# Patient Record
Sex: Male | Born: 1955 | Race: White | Hispanic: No | Marital: Married | Smoking: Never smoker
Health system: Southern US, Community
[De-identification: ages and names within clinical notes are randomized; demographics above are authoritative.]

## PROBLEM LIST (undated history)

## (undated) DIAGNOSIS — Z86718 Personal history of other venous thrombosis and embolism: Secondary | ICD-10-CM

## (undated) DIAGNOSIS — C801 Malignant (primary) neoplasm, unspecified: Secondary | ICD-10-CM

## (undated) DIAGNOSIS — E785 Hyperlipidemia, unspecified: Secondary | ICD-10-CM

## (undated) HISTORY — DX: Hyperlipidemia, unspecified: E78.5

## (undated) HISTORY — PX: CLEFT PALATE REPAIR: SUR1165

## (undated) HISTORY — DX: Personal history of other venous thrombosis and embolism: Z86.718

## (undated) HISTORY — PX: OTHER SURGICAL HISTORY: SHX169

## (undated) HISTORY — PX: TONSILLECTOMY: SUR1361

---

## 2006-03-19 ENCOUNTER — Emergency Department (HOSPITAL_COMMUNITY): Admission: EM | Admit: 2006-03-19 | Discharge: 2006-03-20 | Payer: Self-pay | Admitting: Emergency Medicine

## 2006-12-30 ENCOUNTER — Ambulatory Visit: Payer: Self-pay | Admitting: Internal Medicine

## 2007-02-23 ENCOUNTER — Ambulatory Visit: Payer: Self-pay | Admitting: Internal Medicine

## 2007-02-23 LAB — CONVERTED CEMR LAB
ALT: 48 units/L — ABNORMAL HIGH (ref 0–40)
AST: 38 units/L — ABNORMAL HIGH (ref 0–37)
Albumin: 4 g/dL (ref 3.5–5.2)
Alkaline Phosphatase: 37 units/L — ABNORMAL LOW (ref 39–117)
BUN: 19 mg/dL (ref 6–23)
Basophils Absolute: 0 10*3/uL (ref 0.0–0.1)
Basophils Relative: 0.9 % (ref 0.0–1.0)
Bilirubin, Direct: 0.1 mg/dL (ref 0.0–0.3)
CO2: 29 meq/L (ref 19–32)
Calcium: 9.4 mg/dL (ref 8.4–10.5)
Chloride: 105 meq/L (ref 96–112)
Cholesterol: 242 mg/dL (ref 0–200)
Creatinine, Ser: 1.1 mg/dL (ref 0.4–1.5)
Direct LDL: 170.6 mg/dL
Eosinophils Absolute: 0.2 10*3/uL (ref 0.0–0.6)
Eosinophils Relative: 2.9 % (ref 0.0–5.0)
GFR calc Af Amer: 91 mL/min
GFR calc non Af Amer: 75 mL/min
Glucose, Bld: 115 mg/dL — ABNORMAL HIGH (ref 70–99)
HCT: 43.6 % (ref 39.0–52.0)
HDL: 36.9 mg/dL — ABNORMAL LOW (ref >39.0)
Hemoglobin: 15.3 g/dL (ref 13.0–17.0)
Hgb A1c MFr Bld: 6.5 % — ABNORMAL HIGH (ref 4.6–6.0)
LDL Cholesterol: 160 mg/dL — ABNORMAL HIGH (ref 0–99)
Lymphocytes Relative: 40.3 % (ref 12.0–46.0)
MCHC: 35.2 g/dL (ref 30.0–36.0)
MCV: 92.7 fL (ref 78.0–100.0)
Monocytes Absolute: 0.5 10*3/uL (ref 0.2–0.7)
Monocytes Relative: 9.4 % (ref 3.0–11.0)
Neutro Abs: 2.5 10*3/uL (ref 1.4–7.7)
Neutrophils Relative %: 46.5 % (ref 43.0–77.0)
PSA: 0.68 ng/mL (ref 0.10–4.00)
Platelets: 238 10*3/uL (ref 150–400)
Potassium: 4.4 meq/L (ref 3.5–5.1)
RBC: 4.7 M/uL (ref 4.22–5.81)
RDW: 13.3 % (ref 11.5–14.6)
Sodium: 139 meq/L (ref 135–145)
TSH: 0.76 microintl units/mL (ref 0.35–5.50)
Total Bilirubin: 0.9 mg/dL (ref 0.3–1.2)
Total CHOL/HDL Ratio: 6.6
Total Protein: 6.8 g/dL (ref 6.0–8.3)
Triglycerides: 226 mg/dL (ref 0–149)
VLDL: 45 mg/dL — ABNORMAL HIGH (ref 0–40)
WBC: 5.4 10*3/uL (ref 4.5–10.5)

## 2007-03-02 ENCOUNTER — Ambulatory Visit: Payer: Self-pay | Admitting: Internal Medicine

## 2007-05-12 DIAGNOSIS — J309 Allergic rhinitis, unspecified: Secondary | ICD-10-CM | POA: Insufficient documentation

## 2007-05-12 DIAGNOSIS — E785 Hyperlipidemia, unspecified: Secondary | ICD-10-CM

## 2007-06-02 ENCOUNTER — Ambulatory Visit: Payer: Self-pay | Admitting: Internal Medicine

## 2007-06-02 LAB — CONVERTED CEMR LAB
ALT: 65 units/L — ABNORMAL HIGH (ref 0–53)
AST: 44 units/L — ABNORMAL HIGH (ref 0–37)
Albumin: 4.1 g/dL (ref 3.5–5.2)
Alkaline Phosphatase: 39 units/L (ref 39–117)
Bilirubin, Direct: 0.1 mg/dL (ref 0.0–0.3)
Cholesterol: 181 mg/dL (ref 0–200)
HDL: 28.3 mg/dL — ABNORMAL LOW (ref >39.0)
LDL Cholesterol: 127 mg/dL — ABNORMAL HIGH (ref 0–99)
Total Bilirubin: 0.7 mg/dL (ref 0.3–1.2)
Total CHOL/HDL Ratio: 6.4
Total Protein: 6.8 g/dL (ref 6.0–8.3)
Triglycerides: 128 mg/dL (ref 0–149)
VLDL: 26 mg/dL (ref 0–40)

## 2007-06-05 ENCOUNTER — Telehealth: Payer: Self-pay | Admitting: Internal Medicine

## 2007-06-09 ENCOUNTER — Ambulatory Visit: Payer: Self-pay | Admitting: Internal Medicine

## 2007-06-09 DIAGNOSIS — IMO0002 Reserved for concepts with insufficient information to code with codable children: Secondary | ICD-10-CM

## 2007-06-09 DIAGNOSIS — B372 Candidiasis of skin and nail: Secondary | ICD-10-CM

## 2007-06-09 LAB — CONVERTED CEMR LAB
Cholesterol, target level: 200 mg/dL
HDL goal, serum: 40 mg/dL
LDL Goal: 130 mg/dL

## 2007-07-14 ENCOUNTER — Telehealth: Payer: Self-pay | Admitting: *Deleted

## 2007-07-18 ENCOUNTER — Telehealth: Payer: Self-pay | Admitting: Internal Medicine

## 2007-09-22 ENCOUNTER — Ambulatory Visit: Payer: Self-pay | Admitting: Internal Medicine

## 2007-09-22 LAB — CONVERTED CEMR LAB
Cholesterol: 210 mg/dL (ref 0–200)
Direct LDL: 155.3 mg/dL
HDL: 28.8 mg/dL — ABNORMAL LOW (ref >39.0)
Total CHOL/HDL Ratio: 7.3
Triglycerides: 160 mg/dL — ABNORMAL HIGH (ref 0–149)
VLDL: 32 mg/dL (ref 0–40)

## 2007-10-02 ENCOUNTER — Ambulatory Visit: Payer: Self-pay | Admitting: Internal Medicine

## 2007-10-02 LAB — CONVERTED CEMR LAB: LDL Cholesterol: 155 mg/dL

## 2008-01-23 ENCOUNTER — Ambulatory Visit: Payer: Self-pay | Admitting: Internal Medicine

## 2008-01-23 DIAGNOSIS — T887XXA Unspecified adverse effect of drug or medicament, initial encounter: Secondary | ICD-10-CM | POA: Insufficient documentation

## 2008-01-23 LAB — CONVERTED CEMR LAB
ALT: 69 units/L — ABNORMAL HIGH (ref 0–53)
AST: 50 units/L — ABNORMAL HIGH (ref 0–37)
Albumin: 4.3 g/dL (ref 3.5–5.2)
Alkaline Phosphatase: 30 units/L — ABNORMAL LOW (ref 39–117)
Bilirubin, Direct: 0.1 mg/dL (ref 0.0–0.3)
Cholesterol: 198 mg/dL (ref 0–200)
HDL: 29.2 mg/dL — ABNORMAL LOW (ref >39.0)
LDL Cholesterol: 145 mg/dL — ABNORMAL HIGH (ref 0–99)
Total Bilirubin: 0.9 mg/dL (ref 0.3–1.2)
Total CHOL/HDL Ratio: 6.8
Total Protein: 6.9 g/dL (ref 6.0–8.3)
Triglycerides: 117 mg/dL (ref 0–149)
VLDL: 23 mg/dL (ref 0–40)

## 2008-01-30 ENCOUNTER — Ambulatory Visit: Payer: Self-pay | Admitting: Internal Medicine

## 2008-04-22 ENCOUNTER — Ambulatory Visit: Payer: Self-pay | Admitting: Internal Medicine

## 2008-04-22 LAB — CONVERTED CEMR LAB
ALT: 55 units/L — ABNORMAL HIGH (ref 0–53)
AST: 35 units/L (ref 0–37)
Albumin: 4.1 g/dL (ref 3.5–5.2)
Alkaline Phosphatase: 32 units/L — ABNORMAL LOW (ref 39–117)
Bilirubin, Direct: 0.1 mg/dL (ref 0.0–0.3)
Cholesterol: 184 mg/dL (ref 0–200)
HDL: 28.7 mg/dL — ABNORMAL LOW (ref >39.0)
LDL Cholesterol: 127 mg/dL — ABNORMAL HIGH (ref 0–99)
Total Bilirubin: 0.8 mg/dL (ref 0.3–1.2)
Total CHOL/HDL Ratio: 6.4
Total Protein: 6.7 g/dL (ref 6.0–8.3)
Triglycerides: 143 mg/dL (ref 0–149)
VLDL: 29 mg/dL (ref 0–40)

## 2008-04-29 ENCOUNTER — Ambulatory Visit: Payer: Self-pay | Admitting: Internal Medicine

## 2008-07-29 ENCOUNTER — Ambulatory Visit: Payer: Self-pay | Admitting: Internal Medicine

## 2009-01-20 ENCOUNTER — Ambulatory Visit: Payer: Self-pay | Admitting: Internal Medicine

## 2009-01-20 LAB — CONVERTED CEMR LAB
ALT: 57 U/L — ABNORMAL HIGH
AST: 29 U/L
Albumin: 4.2 g/dL
Alkaline Phosphatase: 92 U/L
BUN: 16 mg/dL
Basophils Absolute: 0 10*3/uL
Basophils Relative: 1 %
Bilirubin Urine: NEGATIVE
Bilirubin, Direct: 0 mg/dL
Blood in Urine, dipstick: NEGATIVE
CO2: 28 meq/L
Calcium: 9.4 mg/dL
Chloride: 103 meq/L
Cholesterol: 239 mg/dL — ABNORMAL HIGH
Creatinine, Ser: 1.2 mg/dL
Direct LDL: 109 mg/dL
Eosinophils Absolute: 0.1 10*3/uL
Eosinophils Relative: 3.1 %
GFR calc non Af Amer: 67.44 mL/min
Glucose, Bld: 374 mg/dL — ABNORMAL HIGH
HCT: 45.6 %
HDL: 32.9 mg/dL — ABNORMAL LOW
Hemoglobin: 16 g/dL
Lymphocytes Relative: 41.7 %
Lymphs Abs: 1.9 10*3/uL
MCHC: 35.2 g/dL
MCV: 93 fL
Monocytes Absolute: 0.4 10*3/uL
Monocytes Relative: 8.4 %
Neutro Abs: 2.1 10*3/uL
Neutrophils Relative %: 45.8 %
Nitrite: NEGATIVE
PSA: 0.46 ng/mL
Platelets: 202 10*3/uL
Potassium: 3.8 meq/L
RBC: 4.91 M/uL
RDW: 12.2 %
Sodium: 141 meq/L
Specific Gravity, Urine: 1.01
TSH: 0.95 u[IU]/mL
Total Bilirubin: 0.9 mg/dL
Total CHOL/HDL Ratio: 7
Total Protein: 7.1 g/dL
Triglycerides: 635 mg/dL — ABNORMAL HIGH
Urobilinogen, UA: 0.2
VLDL: 127 mg/dL — ABNORMAL HIGH
WBC Urine, dipstick: NEGATIVE
WBC: 4.5 10*3/uL
pH: 5.5

## 2009-01-27 ENCOUNTER — Encounter: Payer: Self-pay | Admitting: Internal Medicine

## 2009-01-27 ENCOUNTER — Ambulatory Visit: Payer: Self-pay | Admitting: Internal Medicine

## 2009-01-27 DIAGNOSIS — E8881 Metabolic syndrome: Secondary | ICD-10-CM

## 2009-01-31 ENCOUNTER — Encounter: Payer: Self-pay | Admitting: Internal Medicine

## 2009-02-11 ENCOUNTER — Telehealth: Payer: Self-pay | Admitting: Internal Medicine

## 2009-02-19 ENCOUNTER — Telehealth (INDEPENDENT_AMBULATORY_CARE_PROVIDER_SITE_OTHER): Payer: Self-pay | Admitting: *Deleted

## 2009-03-04 ENCOUNTER — Ambulatory Visit: Payer: Self-pay | Admitting: Internal Medicine

## 2009-03-04 LAB — CONVERTED CEMR LAB
Hgb A1c MFr Bld: 8.7 % — ABNORMAL HIGH (ref 4.6–6.5)
Microalb Creat Ratio: 39.5 mg/g — ABNORMAL HIGH (ref 0.0–30.0)
Microalb, Ur: 5.6 mg/dL — ABNORMAL HIGH (ref 0.0–1.9)

## 2009-03-11 ENCOUNTER — Ambulatory Visit: Payer: Self-pay | Admitting: Internal Medicine

## 2009-06-10 ENCOUNTER — Ambulatory Visit: Payer: Self-pay | Admitting: Internal Medicine

## 2009-06-10 LAB — CONVERTED CEMR LAB
ALT: 62 units/L — ABNORMAL HIGH (ref 0–53)
AST: 43 units/L — ABNORMAL HIGH (ref 0–37)
BUN: 17 mg/dL (ref 6–23)
Calcium: 9.6 mg/dL (ref 8.4–10.5)
GFR calc non Af Amer: 61.4 mL/min (ref >60)
Glucose, Bld: 101 mg/dL — ABNORMAL HIGH (ref 70–99)
HDL: 32.6 mg/dL — ABNORMAL LOW (ref >39.00)
Hgb A1c MFr Bld: 6 % (ref 4.6–6.5)
Sodium: 142 meq/L (ref 135–145)
Total Bilirubin: 0.6 mg/dL (ref 0.3–1.2)
Total Protein: 7.3 g/dL (ref 6.0–8.3)

## 2009-07-28 ENCOUNTER — Telehealth: Payer: Self-pay | Admitting: Internal Medicine

## 2009-10-13 ENCOUNTER — Ambulatory Visit: Payer: Self-pay | Admitting: Internal Medicine

## 2009-12-22 ENCOUNTER — Ambulatory Visit: Payer: Self-pay | Admitting: Internal Medicine

## 2009-12-22 LAB — CONVERTED CEMR LAB
ALT: 54 units/L — ABNORMAL HIGH (ref 0–53)
AST: 36 units/L (ref 0–37)
Alkaline Phosphatase: 34 units/L — ABNORMAL LOW (ref 39–117)
Total Bilirubin: 0.4 mg/dL (ref 0.3–1.2)
Total CHOL/HDL Ratio: 5
Triglycerides: 100 mg/dL (ref 0.0–149.0)

## 2009-12-29 ENCOUNTER — Ambulatory Visit: Payer: Self-pay | Admitting: Internal Medicine

## 2009-12-30 ENCOUNTER — Telehealth: Payer: Self-pay | Admitting: Internal Medicine

## 2009-12-30 ENCOUNTER — Encounter: Payer: Self-pay | Admitting: Internal Medicine

## 2010-03-30 ENCOUNTER — Ambulatory Visit: Payer: Self-pay | Admitting: Internal Medicine

## 2010-03-30 DIAGNOSIS — K589 Irritable bowel syndrome without diarrhea: Secondary | ICD-10-CM

## 2010-05-11 ENCOUNTER — Ambulatory Visit: Payer: Self-pay | Admitting: Internal Medicine

## 2010-07-15 ENCOUNTER — Ambulatory Visit: Payer: Self-pay | Admitting: Internal Medicine

## 2010-07-15 LAB — CONVERTED CEMR LAB
BUN: 16 mg/dL (ref 6–23)
CO2: 29 meq/L (ref 19–32)
Chloride: 104 meq/L (ref 96–112)
Creatinine, Ser: 1.1 mg/dL (ref 0.4–1.5)

## 2010-08-10 ENCOUNTER — Telehealth: Payer: Self-pay | Admitting: Internal Medicine

## 2010-10-13 NOTE — Assessment & Plan Note (Signed)
Summary: 2 month rov/njr   Vital Signs:  Patient profile:   55 year old male Height:      69 inches Weight:      268 pounds BMI:     39.72 Temp:     98.2 degrees F oral Pulse rate:   72 / minute Resp:     14 per minute BP sitting:   136 / 80  (left arm)  Vitals Entered By: Allyne Gee, LPN (July 15, 9561 11:34 AM)  Contraindications/Deferment of Procedures/Staging:    Test/Procedure: FLU VAX    Reason for deferment: patient declined   CC: roa- states fasting blood sugars are running around 118, Back Pain, Lipid Management Is Patient Diabetic? Yes Did you bring your meter with you today? No   Primary Care Provider:  Ricard Dillon MD  CC:  roa- states fasting blood sugars are running around 118, Back Pain, and Lipid Management.  History of Present Illness: weight down some blood pressure stable weight check and discussion of diet off the pheterimine blood sugars in the 110-120 range has not had any hypoglucemic episodes  Lipid Management History:      Positive NCEP/ATP III risk factors include male age 5 years old or older and HDL cholesterol less than 40.  Negative NCEP/ATP III risk factors include non-diabetic, non-tobacco-user status, non-hypertensive, no ASHD (atherosclerotic heart disease), no prior stroke/TIA, no peripheral vascular disease, and no history of aortic aneurysm.     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 Once Daily  Off and On 2)  Flonase 50 Mcg/act  Susp (Fluticasone Propionate) .... As Needed 3)  Zyrtec Allergy 10 Mg  Tabs (Cetirizine Hcl) .... Two Times A Day As Needed 4)  Bl Glucosamine-Chondroitin 500-400 Mg  Tabs (Glucosamine-Chondroitin) .... Once Daily 5)  Fish Oil Concentrate 1000 Mg  Caps (Omega-3 Fatty Acids) .... One By Mouth Three Times A Day 6)  Trilipix 135 Mg  Cpdr (Choline Fenofibrate) .... One By Mouth Daily 7)  Janumet 50-500 Mg Tabs (Sitagliptin-Metformin Hcl) .... One By Mouth Two Times A Day 8)  Freestyle Test  Strp (Glucose Blood) .[name].. 1 Once Daily 9)  Freestyle Lancets  Misc (Lancets) .... Use As Directed  Current Medications (verified): 1)  Multivitamins   Caps (Multiple Vitamin) .... Once Daily  Off and On 2)  Flonase 50 Mcg/act  Susp (Fluticasone Propionate) .... As Needed 3)  Zyrtec Allergy 10 Mg  Tabs (Cetirizine Hcl) .... Two Times A Day As Needed 4)  Bl Glucosamine-Chondroitin 500-400 Mg  Tabs (Glucosamine-Chondroitin) .... Once Daily 5)  Fish Oil Concentrate 1000 Mg  Caps (Omega-3 Fatty Acids) .... One By Mouth Three Times A Day 6)  Trilipix 135 Mg  Cpdr (Choline Fenofibrate) .... One By Mouth Daily 7)  Janumet 50-500 Mg Tabs (Sitagliptin-Metformin Hcl) .... One By Mouth Two Times A Day 8)  Freestyle Test  Strp (Glucose Blood) .[name].. 1 Once Daily 9)  Freestyle Lancets  Misc (Lancets) .... Use As Directed  Allergies (verified): No Known Drug Allergies  Past History:  Family History: Last  updated: 06/09/2007 Father alive at b3 Family History Hypertension Mother  Social History: Last updated: 06/09/2007 Married Never Smoked Alcohol use-yes Drug use-no Regular exercise-no  Risk Factors: Exercise: no (06/09/2007)  Risk Factors: Smoking Status: never (07/15/2010)  Past medical, surgical, family and social histories (including risk factors) reviewed, and no changes noted (except as noted below).  Past Medical History: Reviewed history from 05/12/2007 and no changes required. Allergic rhinitis Hyperlipidemia  Past Surgical History: Reviewed history from 06/09/2007 and no changes required. 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 Father alive at b3 Family History Hypertension Mother  Social History: Reviewed history from 06/09/2007 and no changes required. Married Never Smoked Alcohol use-yes Drug use-no Regular exercise-no  Review of Systems  The patient denies anorexia, fever, weight loss, weight gain, vision loss, decreased hearing, hoarseness, chest pain, syncope, dyspnea on exertion, peripheral edema, prolonged cough, headaches, hemoptysis, abdominal pain, melena, hematochezia, severe indigestion/heartburn, hematuria, incontinence, genital sores, muscle weakness, suspicious skin lesions, transient blindness, difficulty walking, depression, unusual weight change, abnormal bleeding, enlarged lymph nodes, angioedema, and breast masses.    Physical Exam  General:  Well-developed,well-nourished,in no acute distress; alert,appropriate and cooperative throughout examination Head:  Normocephalic and atraumatic without obvious abnormalities. No apparent alopecia or balding. Eyes:  pupils equal and pupils round.   Nose:  no external deformity and no nasal discharge.   Mouth:  pharynx pink and moist.   Neck:  No deformities, masses, or tenderness noted. Lungs:  Normal respiratory effort,  chest expands symmetrically. Lungs are clear to auscultation, no crackles or wheezes. Heart:  Normal rate and regular rhythm. S1 and S2 normal without gallop, murmur, click, rub or other extra sounds.   Impression & Recommendations:  Problem # 1:  DYSMETABOLIC SYNDROME X (ZOX-096.0) Assessment Deteriorated  weight loss and jamumet with goal of getting off meds via weight loss weight watchers optifast?  Orders: Venipuncture (45409) Specimen Handling (99000) TLB-BMP (Basic Metabolic Panel-BMET) (81191-YNWGNFA) TLB-A1C / Hgb A1C (Glycohemoglobin) (83036-A1C)  Problem # 2:  IRRITABLE BOWEL SYNDROME (ICD-564.1) fiber diet and smaller more frequent meals  Problem # 3:  HYPERLIPIDEMIA (ICD-272.4)  His updated medication list for this problem includes:    Trilipix 135 Mg Cpdr (Choline fenofibrate) ..... One by mouth daily  Labs Reviewed: SGOT: 36 (12/22/2009)   SGPT: 54 (12/22/2009)  Lipid Goals: Chol Goal: 200 (06/09/2007)   HDL Goal: 40 (06/09/2007)   LDL Goal: 130 (06/09/2007)   TG Goal: 150 (06/09/2007)  10 Yr Risk Heart Disease: 11 % Prior 10 Yr Risk Heart Disease: 14 % (05/11/2010)   HDL:34.10 (12/22/2009), 32.60 (06/10/2009)  LDL:112 (12/22/2009), 127 (04/22/2008)  Chol:166 (12/22/2009), 182 (06/10/2009)  Trig:100.0 (12/22/2009), 635.0 (01/20/2009)  Complete Medication List: 1)  Multivitamins Caps (Multiple vitamin) .... Once daily  off and on 2)  Flonase 50 Mcg/act Susp (Fluticasone propionate) .... As needed 3)  Zyrtec Allergy 10 Mg Tabs (Cetirizine hcl) .... Two  times a day as needed 4)  Bl Glucosamine-chondroitin 500-400 Mg Tabs (Glucosamine-chondroitin) .... Once daily 5)  Fish Oil Concentrate 1000 Mg Caps (Omega-3 fatty acids) .... One by mouth three times a day 6)  Trilipix 135 Mg Cpdr (Choline fenofibrate) .... One by mouth daily 7)  Janumet 50-500 Mg Tabs (Sitagliptin-metformin hcl) .... One by mouth two times a day 8)  Freestyle Test Strp (Glucose blood) .[name].. 1  once daily 9)  Freestyle Lancets Misc (Lancets) .... Use as directed  Lipid Assessment/Plan:      Based on NCEP/ATP III, the patient's risk factor category is "0-1 risk factors".  The patient's lipid goals are as follows: Total cholesterol goal is 200; LDL cholesterol goal is 130; HDL cholesterol goal is 40; Triglyceride goal is 150.  His LDL cholesterol goal has not been met.  Secondary causes for hyperlipidemia have been ruled out.  He has been counseled on adjunctive measures for lowering his cholesterol and has been provided with dietary instructions.    Patient Instructions: 1)  optifast or weight watchers ASAP 2)  Please schedule a follow-up appointment in 3 months. Prescriptions: JANUMET 50-500 MG TABS (SITAGLIPTIN-METFORMIN HCL) one by mouth two times a day  #180 x 3   Entered and Authorized by:   Ricard Dillon MD   Signed by:   Ricard Dillon MD on 07/15/2010   Method used:   Faxed to ...       [facility] (mail-order)             , [place]         Ph: [phone]       Fax: [phone]   RxID:   [id number] [place] (CHOLINE FENOFIBRATE) one by mouth daily  #90 x 3   Entered and Authorized by:   Ricard Dillon MD   Signed by:   Ricard Dillon MD on 07/15/2010   Method used:   Faxed to ...       [facility] (mail-order)             , [place]         Ph: [phone]       Fax: [phone]   RxID:   [id number]    Orders Added: 1)  Est. Patient Level IV [83374] 2)  Venipuncture [45146] 3)  Specimen Handling [04799] 8)  TLB-BMP (Basic Metabolic Panel-BMET) [72158-NGBMBOM] 5)  TLB-A1C / Hgb A1C (Glycohemoglobin) [83036-A1C]

## 2010-10-13 NOTE — Medication Information (Signed)
Summary: Prior Authorization and Coverage Approval for Phentermine Hcl  Prior Authorization and Coverage Approval for Phentermine Hcl   Imported By: Laural Benes 01/05/2010 13:15:11  _____________________________________________________________________  External Attachment:    Type:   Image     Comment:   External Document

## 2010-10-13 NOTE — Progress Notes (Signed)
Summary: Pt req new scripts for Trilipix and Janumet to [facility] order  Phone Note Refill Request Call back at Home Phone [phone] Call back at Work Phone [phone] Message from:  Patient on August 10, 2010 11:58 AM  Refills Requested: Medication #1:  TRILIPIX 135 MG  CPDR one by mouth daily   Dosage confirmed as above?Dosage Confirmed   Supply Requested: 3 months  Medication #2:  JANUMET 50-500 MG TABS one by mouth two times a day   Dosage confirmed as above?Dosage Confirmed   Supply Requested: 3 months Pt is needing new scripts written asap and sent to [facility] mail order. The old scripts expired per [facility].      Method Requested: Fax to [facility] Order ASAP Initial call taken by: Braulio Bosch,  August 10, 2010 11:58 AM    Prescriptions: JANUMET 50-500 MG TABS (SITAGLIPTIN-METFORMIN HCL) one by mouth two times a day  #180 x 3   Entered by:   Allyne Gee, LPN   Authorized by:   Ricard Dillon MD   Signed by:   Allyne Gee, LPN on 73/22/5672   Method used:   Electronically to        [facility] (retail)             ,          Ph: [phone]       Fax: [phone]   RxID:   [id number] TRILIPIX 135 [place] (CHOLINE FENOFIBRATE) one by mouth daily  #90 x 3   Entered by:   Allyne Gee, LPN   Authorized by:   Ricard Dillon MD   Signed by:   Allyne Gee, LPN on 36/85/9923   Method used:   Electronically to        [facility] (retail)             ,          Ph: [phone]       Fax: [phone]   RxID:   [id number]

## 2010-10-13 NOTE — Assessment & Plan Note (Signed)
Summary: 3 month rov/njr   Vital Signs:  Patient profile:   55 year old male Height:      69 inches Weight:      266 pounds BMI:     39.42 Temp:     98.2 degrees F oral Pulse rate:   7 / minute Resp:     14 per minute BP sitting:   126 / 80  (left arm) Cuff size:   large  Vitals Entered By: Allyne Gee, LPN (March 30, 1760 6:07 AM) CC: roa- couldnt tolerate phentermine-took 3 and it made him feel "funny", Lipid Management   CC:  roa- couldnt tolerate phentermine-took 3 and it made him feel "funny" and Lipid Management.  History of Present Illness: took  the phenteriming and had palpitations was very anxious could not tolerate the medications pt has morbid obesity chronic back pain  hyperlipidemia   Lipid Management History:      Positive NCEP/ATP III risk factors include male age 54 years old or older and HDL cholesterol less than 40.  Negative NCEP/ATP III risk factors include non-diabetic, non-tobacco-user status, non-hypertensive, no ASHD (atherosclerotic heart disease), no prior stroke/TIA, no peripheral vascular disease, and no history of aortic aneurysm.     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 Once Daily  Off and On 2)  Flonase 50 Mcg/act  Susp (Fluticasone Propionate) .... As Needed 3)  Zyrtec Allergy 10 Mg  Tabs (Cetirizine Hcl) .... Two Times A Day As Needed 4)  Bl Glucosamine-Chondroitin 500-400 Mg  Tabs (Glucosamine-Chondroitin) .... Once Daily 5)  Fish Oil Concentrate 1000 Mg  Caps (Omega-3 Fatty Acids) .... One By Mouth Three Times A Day 6)  Trilipix 135 Mg  Cpdr (Choline Fenofibrate) .... One By Mouth Daily 7)  Janumet 50-500 Mg Tabs (Sitagliptin-Metformin Hcl) .... One By Mouth Two Times A Day 8)  Freestyle Test  Strp (Glucose Blood) .[name].. 1 Once Daily 9)  Freestyle Lancets  Misc (Lancets) .... Use As Directed 10)  Phentermine Hcl 37.5 Mg Caps (Phentermine Hcl) .... One By Mouth Q Am  Current Medications (verified): 1)  Multivitamins   Caps (Multiple Vitamin) .... Once Daily  Off and On 2)  Flonase 50 Mcg/act  Susp (Fluticasone Propionate) .... As Needed 3)  Zyrtec Allergy 10 Mg  Tabs (Cetirizine Hcl) .... Two Times A Day As Needed 4)  Bl Glucosamine-Chondroitin 500-400 Mg  Tabs (Glucosamine-Chondroitin) .... Once Daily 5)  Fish Oil Concentrate 1000 Mg  Caps (Omega-3 Fatty Acids) .... One By Mouth Three Times A Day 6)  Trilipix 135 Mg  Cpdr (Choline Fenofibrate) .... One By Mouth Daily 7)  Janumet 50-500 Mg Tabs (Sitagliptin-Metformin Hcl) .... One  By Mouth Two Times A Day 8)  Freestyle Test  Strp (Glucose Blood) .[name].. 1 Once Daily 9)  Freestyle Lancets  Misc (Lancets) .... Use As Directed  Allergies (verified): No Known Drug Allergies  Past History:  Family History: Last updated: 06/09/2007 Father alive at b3 Family History Hypertension Mother  Social History: Last updated: 06/09/2007 Married Never Smoked Alcohol use-yes Drug use-no Regular exercise-no  Risk Factors: Exercise: no (06/09/2007)  Risk Factors: Smoking Status: never (03/30/2010)  Past medical,  surgical, family and social histories (including risk factors) reviewed, and no changes noted (except as noted below).  Past Medical History: Reviewed history from 05/12/2007 and no changes required. Allergic rhinitis Hyperlipidemia  Past Surgical History: Reviewed history from 06/09/2007 and no changes required. 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 Father alive at b3 Family History Hypertension Mother  Social History: Reviewed history from 06/09/2007 and no changes required. Married Never Smoked Alcohol use-yes Drug use-no Regular exercise-no  Review of Systems  The patient denies anorexia, fever, weight loss, weight gain, vision loss, decreased hearing, hoarseness, chest pain, syncope, dyspnea on exertion, peripheral edema, prolonged cough, headaches, hemoptysis, abdominal pain, melena, hematochezia, severe indigestion/heartburn, hematuria, incontinence, genital sores, muscle weakness, suspicious skin lesions, transient blindness, difficulty walking, depression, unusual weight change, abnormal bleeding, enlarged lymph nodes, angioedema, and breast masses.    Physical Exam  General:  Well-developed,well-nourished,in no acute distress; alert,appropriate and cooperative throughout examination Head:  Normocephalic and atraumatic without obvious abnormalities. No apparent alopecia or balding. Eyes:  pupils equal and pupils round.   Ears:  R ear normal and L ear normal.   Nose:  no external deformity and no nasal discharge.   Mouth:  pharynx pink and moist.   Neck:  No deformities, masses, or tenderness noted. Lungs:  Normal respiratory effort, chest expands symmetrically. Lungs are clear to auscultation, no crackles or wheezes. Heart:  Normal rate and regular rhythm. S1 and S2 normal without gallop, murmur, click, rub or other extra sounds. Abdomen:  soft, non-tender, and distended.  obese normal bowell  sounds Rectal:  No external abnormalities noted. Normal sphincter tone. No rectal masses or tenderness. Msk:  No deformity or scoliosis noted of thoracic or lumbar spine.   Pulses:  R and L carotid,radial,femoral,dorsalis pedis and posterior tibial pulses are full and equal bilaterally Extremities:  trace left pedal edema and trace right pedal edema.   Neurologic:  alert & oriented X3 and gait normal.   Psych:  Oriented X3 and normally interactive.     Impression & Recommendations:  Problem # 1:  MORBID OBESITY (ICD-278.01) trial of alli Ht: 69 (03/30/2010)   Wt: 266 (03/30/2010)   BMI: 39.42 (03/30/2010)  Problem # 2:  DYSMETABOLIC SYNDROME X (YFV-494.7)  Problem # 3:  HYPERLIPIDEMIA (WHQ-759.4)  His updated medication list for this problem includes:    Trilipix 135 Mg Cpdr (Choline fenofibrate) ..... One by mouth daily  Labs Reviewed: SGOT: 36 (12/22/2009)   SGPT: 54 (12/22/2009)  Lipid Goals: Chol Goal: 200 (06/09/2007)   HDL Goal: 40 (06/09/2007)   LDL Goal: 130 (06/09/2007)   TG Goal: 150 (06/09/2007)  10 Yr Risk Heart Disease: 9 % Prior 10 Yr Risk Heart Disease: 14 % (12/29/2009)   HDL:34.10 (12/22/2009), 32.60 (06/10/2009)  LDL:112 (12/22/2009), 127 (04/22/2008)  Chol:166 (12/22/2009), 182 (06/10/2009)  Trig:100.0 (12/22/2009), 635.0 (01/20/2009)  Problem # 4:  IRRITABLE BOWEL SYNDROME (ICD-564.1)  Labs Reviewed: SGOT: 36 (12/22/2009)   SGPT:  54 (12/22/2009)  Lipid Goals: Chol Goal: 200 (06/09/2007)   HDL Goal: 40 (06/09/2007)   LDL Goal: 130 (06/09/2007)   TG Goal: 150 (06/09/2007)  10 Yr Risk Heart Disease: 9 % Prior 10 Yr Risk Heart Disease: 14 % (12/29/2009)   HDL:34.10 (12/22/2009), 32.60 (06/10/2009)  LDL:112 (12/22/2009), 127 (04/22/2008)  Chol:166 (12/22/2009), 182 (06/10/2009)  Trig:100.0 (12/22/2009), 635.0 (01/20/2009)  Problem # 5:  ALLERGIC RHINITIS (ICD-477.9)  His updated medication list for this problem includes:    Flonase 50 Mcg/act Susp  (Fluticasone propionate) .[name]... As needed    Zyrtec Allergy 10 Mg Tabs (Cetirizine hcl) .[name]..[name] Two times a day as needed  Discussed use of allergy medications and environmental measures.   Complete Medication List: 1)  Multivitamins Caps (Multiple vitamin) .... Once daily  off and on 2)  Flonase 50 Mcg/act Susp (Fluticasone propionate) .... As needed 3)  Zyrtec Allergy 10 Mg Tabs (Cetirizine hcl) .... Two times a day as needed 4)  Bl Glucosamine-chondroitin 500-400 Mg Tabs (Glucosamine-chondroitin) .... Once daily 5)  Fish Oil Concentrate 1000 Mg Caps (Omega-3 fatty acids) .... One by mouth three times a day 6)  Trilipix 135 Mg Cpdr (Choline fenofibrate) .... One by mouth daily 7)  Janumet 50-500 Mg Tabs (Sitagliptin-metformin hcl) .... One by mouth two times a day 8)  Freestyle Test Strp (Glucose blood) .[name].. 1 once daily 9)  Freestyle Lancets Misc (Lancets) .... Use as directed  Lipid Assessment/Plan:      Based on NCEP/ATP III, the patient's risk factor category is "2 or more risk factors and a calculated 10 year CAD risk of < 20%".  The patient's lipid goals are as follows: Total cholesterol goal is 200; LDL cholesterol goal is 130; HDL cholesterol goal is 40; Triglyceride goal is 150.  His LDL cholesterol goal has not been met.  Secondary causes for hyperlipidemia have been ruled out.  He has been counseled on adjunctive measures for lowering his cholesterol and has been provided with dietary instructions.    Patient Instructions: 1)  trial of the alli program for fat blocking 2)  Please schedule a follow-up appointment in 1 month.

## 2010-10-13 NOTE — Assessment & Plan Note (Signed)
Summary: 1 month rov/njr   Vital Signs:  Patient profile:   55 year old male Height:      69 inches Weight:      271 pounds BMI:     40.16 Temp:     98.2 degrees F oral Pulse rate:   72 / minute Resp:     14 per minute BP sitting:   140 / 80  (left arm) Cuff size:   large  Vitals Entered By: Allyne Gee, LPN (May 11, 2093 9:22 AM)  Nutrition Counseling: Patient's BMI is greater than 25 and therefore counseled on weight management options. CC: roa, Lipid Management Is Patient Diabetic? Yes Did you bring your meter with you today? No   Primary Care Provider:  Ricard Dillon MD  CC:  roa and Lipid Management.  History of Present Illness: follow up OV the ali did not help weight actually gained weight the interalationship with weight and DM and OA was discussed blood pressure is  incresaing wth the increased weight back pain is stable  Lipid Management History:      Positive NCEP/ATP III risk factors include male age 38 years old or older and HDL cholesterol less than 40.  Negative NCEP/ATP III risk factors include non-diabetic, non-tobacco-user status, non-hypertensive, no ASHD (atherosclerotic heart disease), no prior stroke/TIA, no peripheral vascular disease, and no history of aortic aneurysm.     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 Once Daily  Off and On 2)  Flonase 50 Mcg/act  Susp (Fluticasone Propionate) .... As Needed 3)  Zyrtec Allergy 10 Mg  Tabs (Cetirizine Hcl) .... Two Times A Day As Needed 4)  Bl Glucosamine-Chondroitin 500-400 Mg  Tabs (Glucosamine-Chondroitin) .... Once Daily 5)  Fish Oil Concentrate 1000 Mg  Caps (Omega-3 Fatty Acids) .... One By Mouth Three Times A Day 6)  Trilipix 135 Mg  Cpdr (Choline Fenofibrate) .... One By Mouth Daily 7)  Janumet 50-500 Mg Tabs (Sitagliptin-Metformin Hcl) .... One By Mouth Two Times A Day 8)  Freestyle Test  Strp (Glucose Blood) .[name].. 1 Once Daily 9)  Freestyle Lancets  Misc (Lancets) .... Use As Directed  Current Medications (verified): 1)  Multivitamins   Caps (Multiple Vitamin) .... Once Daily  Off and On 2)  Flonase 50 Mcg/act  Susp (Fluticasone Propionate) .... As Needed 3)  Zyrtec Allergy 10 Mg  Tabs (Cetirizine Hcl) .... Two Times A Day As Needed 4)  Bl Glucosamine-Chondroitin 500-400 Mg  Tabs (Glucosamine-Chondroitin) .... Once Daily 5)  Fish Oil Concentrate 1000 Mg  Caps (Omega-3 Fatty Acids) .... One By Mouth Three  Times A Day 6)  Trilipix 135 Mg  Cpdr (Choline Fenofibrate) .... One By Mouth Daily 7)  Janumet 50-500 Mg Tabs (Sitagliptin-Metformin Hcl) .... One By Mouth Two Times A Day 8)  Freestyle Test  Strp (Glucose Blood) .[name].. 1 Once Daily 9)  Freestyle Lancets  Misc (Lancets) .... Use As Directed  Allergies (verified): No Known Drug Allergies  Past History:  Family History: Last updated: 06/09/2007 Father alive at b3 Family History Hypertension Mother  Social History: Last updated: 06/09/2007 Married Never Smoked Alcohol  use-yes Drug use-no Regular exercise-no  Risk Factors: Exercise: no (06/09/2007)  Risk Factors: Smoking Status: never (05/11/2010)  Past medical, surgical, family and social histories (including risk factors) reviewed, and no changes noted (except as noted below).  Past Medical History: Reviewed history from 05/12/2007 and no changes required. Allergic rhinitis Hyperlipidemia  Past Surgical History: Reviewed history from 06/09/2007 and no changes required. 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 Father alive at b3 Family History Hypertension Mother  Social History: Reviewed history from 06/09/2007 and no changes required. Married Never Smoked Alcohol use-yes Drug use-no Regular exercise-no  Review of Systems  The patient denies anorexia, fever, weight loss, weight gain, vision loss, decreased hearing, hoarseness, chest pain, syncope, dyspnea on exertion, peripheral edema, prolonged cough, headaches, hemoptysis, abdominal pain, melena, hematochezia, severe indigestion/heartburn, hematuria, incontinence, genital sores, muscle weakness, suspicious skin lesions, transient blindness, difficulty walking, depression, unusual weight change, abnormal bleeding, enlarged lymph nodes, angioedema, and breast masses.    Physical Exam  General:  Well-developed,well-nourished,in no acute distress; alert,appropriate and cooperative throughout examination Head:  Normocephalic and atraumatic without obvious abnormalities. No apparent alopecia or balding. Eyes:  pupils equal and pupils round.   Ears:  R ear normal and L ear normal.   Nose:  no external deformity and no nasal discharge.   Mouth:  pharynx pink and moist.   Neck:  No deformities, masses, or tenderness noted. Lungs:  Normal respiratory effort, chest expands symmetrically. Lungs are clear to auscultation, no crackles or wheezes. Heart:  Normal rate and  regular rhythm. S1 and S2 normal without gallop, murmur, click, rub or other extra sounds. Abdomen:  soft, non-tender, and distended.  obese normal bowell sounds Msk:  No deformity or scoliosis noted of thoracic or lumbar spine.   Pulses:  R and L carotid,radial,femoral,dorsalis pedis and posterior tibial pulses are full and equal bilaterally Extremities:  trace left pedal edema and trace right pedal edema.   Neurologic:  alert & oriented X3 and gait normal.     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 One by mouth daily  Labs Reviewed: SGOT: 36 (12/22/2009)   SGPT: 54 (12/22/2009)  Lipid Goals: Chol Goal: 200 (06/09/2007)   HDL Goal: 40 (06/09/2007)   LDL Goal: 130 (06/09/2007)   TG Goal: 150 (06/09/2007)  10 Yr Risk Heart Disease: 14 % Prior 10 Yr Risk Heart Disease: 9 % (03/30/2010)   HDL:34.10 (12/22/2009), 32.60 (06/10/2009)  LDL:112 (12/22/2009), 127 (04/22/2008)  Chol:166 (  12/22/2009), 182 (06/10/2009)  Trig:100.0 (12/22/2009), 635.0 (01/20/2009)  Problem # 4:  BACK PAIN WITH RADICULOPATHY (ICD-729.2)  Complete Medication List: 1)  Multivitamins Caps (Multiple vitamin) .... Once daily  off and on 2)  Flonase 50 Mcg/act Susp (Fluticasone propionate) .... As needed 3)  Zyrtec Allergy 10 Mg Tabs (Cetirizine hcl) .... Two times a day as needed 4)  Bl Glucosamine-chondroitin 500-400 Mg Tabs (Glucosamine-chondroitin) .... Once daily 5)  Fish Oil Concentrate 1000 Mg Caps (Omega-3 fatty acids) .... One by mouth three times  a day 6)  Trilipix 135 Mg Cpdr (Choline fenofibrate) .... One by mouth daily 7)  Janumet 50-500 Mg Tabs (Sitagliptin-metformin hcl) .... One by mouth two times a day 8)  Freestyle Test Strp (Glucose blood) .[name].. 1 once daily 9)  Freestyle Lancets Misc (Lancets) .... Use as directed  Lipid Assessment/Plan:      Based on NCEP/ATP III, the patient's risk factor category is "2 or more risk factors and a calculated 10 year CAD risk of < 20%".  The patient's lipid goals are as follows: Total cholesterol goal is 200; LDL cholesterol goal is 130; HDL cholesterol goal is 40; Triglyceride goal is 150.  His LDL cholesterol goal has not been met.  Secondary causes for hyperlipidemia have been ruled out.  He has been counseled on adjunctive measures for lowering his cholesterol and has been provided with dietary instructions.    Patient Instructions: 1)  Weigth watcher program would be the best 2)  Please schedule a follow-up appointment in 2 months.

## 2010-10-13 NOTE — Progress Notes (Signed)
Summary: PA  Phone Note Call from Patient Call back at Home Phone [phone]   Summary of Call: [facility]. The phentermine 37.62m one daily am 30/3rf.  [facility] says to call for prior auth [id number] be smoother and faster time to getting drug.  Done.  Faxing forms.  Case ID# [id number] PShelbie Hutching RN  December 30, 2009 8:42 AM  Initial call taken by: PShelbie Hutching RN,  December 30, 2009 8:31 AM  Follow-up for Phone Call        ok Follow-up by: BAllyne Gee LPN,  April 19, [id number]:01 PM

## 2010-10-13 NOTE — Assessment & Plan Note (Signed)
Summary: 3 month rov/njr   Vital Signs:  Patient profile:   55 year old male Height:      69 inches Weight:      263 pounds BMI:     38.98 Temp:     98.2 degrees F oral Pulse rate:   72 / minute Resp:     14 per minute BP sitting:   140 / 80  (left arm) Cuff size:   large  Vitals Entered By: Allyne Gee, LPN (December 29, 8544 27:03 AM) CC: roa labs, Lipid Management   CC:  roa labs and Lipid Management.  History of Present Illness:  Hyperlipidemia Follow-Up      This is a 55 year old man who presents for Hyperlipidemia follow-up.  The patient denies muscle aches, GI upset, abdominal pain, flushing, itching, constipation, diarrhea, and fatigue.  The patient denies the following symptoms: chest pain/pressure, exercise intolerance, dypsnea, palpitations, syncope, and pedal edema.  Compliance with medications (by patient report) has been near 100%.  Dietary compliance has been excellent.  The patient reports exercising 3-4X per week.  Adjunctive measures currently used by the patient include weight reduction.    weigth reduction is the key  Lipid Management History:      Positive NCEP/ATP III risk factors include male age 55 years old or older and HDL cholesterol less than 40.  Negative NCEP/ATP III risk factors include non-diabetic, non-tobacco-user status, non-hypertensive, no ASHD (atherosclerotic heart disease), no prior stroke/TIA, no peripheral vascular disease, and no history of aortic aneurysm.     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 Once Daily  Off and On 2)  Flonase 50 Mcg/act  Susp (Fluticasone Propionate) .... As Needed 3)  Zyrtec Allergy 10 Mg  Tabs (Cetirizine Hcl) .... Two Times A Day As Needed 4)  Bl Glucosamine-Chondroitin 500-400 Mg  Tabs (Glucosamine-Chondroitin) .... Once Daily 5)  Fish Oil Concentrate 1000 Mg  Caps (Omega-3 Fatty Acids) .... One By Mouth Three Times A Day 6)  Trilipix 135 Mg  Cpdr (Choline Fenofibrate) .... One By Mouth Daily 7)  Janumet 50-500 Mg Tabs (Sitagliptin-Metformin Hcl) .... One By Mouth Two Times A Day 8)  Freestyle Test  Strp (Glucose Blood) .[name].. 1 Once Daily 9)  Freestyle Lancets  Misc (Lancets) .... Use As Directed  Current Medications (verified): 1)  Multivitamins   Caps (Multiple Vitamin) .... Once Daily  Off and On 2)  Flonase 50 Mcg/act  Susp (Fluticasone Propionate) .... As Needed 3)  Zyrtec Allergy 10 Mg  Tabs (Cetirizine Hcl) .... Two Times A Day As Needed 4)  Bl Glucosamine-Chondroitin 500-400 Mg  Tabs (Glucosamine-Chondroitin) .... Once Daily 5)  Fish Oil Concentrate 1000 Mg  Caps (Omega-3 Fatty Acids) .... One By Mouth Three  Times A Day 6)  Trilipix 135 Mg  Cpdr (Choline Fenofibrate) .... One By Mouth Daily 7)  Janumet 50-500 Mg Tabs (Sitagliptin-Metformin Hcl) .... One By Mouth Two Times A Day 8)  Freestyle Test  Strp (Glucose Blood) .[name].. 1 Once Daily 9)  Freestyle Lancets  Misc (Lancets) .... Use As Directed  Allergies (verified): No Known Drug Allergies  Past History:  Family History: Last updated: 06/09/2007 Father alive at b3 Family History Hypertension Mother  Social History: Last updated:  06/09/2007 Married Never Smoked Alcohol use-yes Drug use-no Regular exercise-no  Risk Factors: Exercise: no (06/09/2007)  Risk Factors: Smoking Status: never (12/29/2009)  Past medical, surgical, family and social histories (including risk factors) reviewed, and no changes noted (except as noted below).  Past Medical History: Reviewed history from 05/12/2007 and no changes required. Allergic rhinitis Hyperlipidemia  Past Surgical History: Reviewed history from 06/09/2007 and no changes required. 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 Father alive at b3 Family History Hypertension Mother  Social History: Reviewed history from 06/09/2007 and no changes required. Married Never Smoked Alcohol use-yes Drug use-no Regular exercise-no  Review of Systems  The patient denies anorexia, fever, weight loss, weight gain, vision loss, decreased hearing, hoarseness, chest pain, syncope, dyspnea on exertion, peripheral edema, prolonged cough, headaches, hemoptysis, abdominal pain, melena, hematochezia, severe indigestion/heartburn, hematuria, incontinence, genital sores, muscle weakness, suspicious skin lesions, transient blindness, difficulty walking, depression, unusual weight change, abnormal bleeding, enlarged lymph nodes, angioedema, and breast masses.    Physical Exam  General:  Well-developed,well-nourished,in no acute distress; alert,appropriate and cooperative throughout examination Head:  Normocephalic and atraumatic without obvious abnormalities. No apparent alopecia or balding. Eyes:  pupils equal and pupils round.   Ears:  R ear normal and L ear normal.   Nose:  no external deformity and no nasal discharge.   Neck:  No deformities, masses, or tenderness noted. Lungs:  Normal respiratory effort, chest expands symmetrically. Lungs are clear to auscultation, no crackles or wheezes. Heart:  Normal  rate and regular rhythm. S1 and S2 normal without gallop, murmur, click, rub or other extra sounds. Abdomen:  soft, non-tender, and distended.  obese normal bowell sounds   Impression & Recommendations:  Problem # 1:  DYSMETABOLIC SYNDROME X (GXQ-119.7)  Problem # 2:  HYPERLIPIDEMIA (ERD-408.4)  His updated medication list for this problem includes:    Trilipix 135 Mg Cpdr (Choline fenofibrate) ..... One by mouth daily  Labs Reviewed: SGOT: 36 (12/22/2009)   SGPT: 54 (12/22/2009)  Lipid Goals: Chol Goal: 200 (06/09/2007)   HDL Goal: 40 (06/09/2007)   LDL Goal: 130 (06/09/2007)   TG Goal: 150 (06/09/2007)  Prior 10 Yr Risk Heart Disease: 11 % (06/10/2009)   HDL:34.10 (12/22/2009), 32.60 (06/10/2009)  LDL:112 (12/22/2009), 127 (04/22/2008)  Chol:166 (12/22/2009), 182 (06/10/2009)  Trig:100.0 (12/22/2009), 635.0 (01/20/2009)  Problem # 3:  MORBID OBESITY (ICD-278.01) trial fo phenterimine Ht: 69 (12/29/2009)   Wt: 263 (12/29/2009)   BMI: 38.98 (12/29/2009)  Complete Medication List: 1)  Multivitamins Caps (Multiple vitamin) .... Once daily  off and on 2)  Flonase 50 Mcg/act Susp (Fluticasone propionate) .... As needed 3)  Zyrtec Allergy 10 Mg Tabs (Cetirizine hcl) .... Two times a day as needed 4)  Bl Glucosamine-chondroitin 500-400 Mg Tabs (Glucosamine-chondroitin) .... Once daily 5)  Fish Oil Concentrate 1000 Mg Caps (Omega-3 fatty acids) .... One by mouth three times a day 6)  Trilipix 135 Mg Cpdr (Choline fenofibrate) .Marland KitchenMarland KitchenMarland Kitchen  One by mouth daily 7)  Janumet 50-500 Mg Tabs (Sitagliptin-metformin hcl) .... One by mouth two times a day 8)  Freestyle Test Strp (Glucose blood) .[name].. 1 once daily 9)  Freestyle Lancets Misc (Lancets) .... Use as directed 10)  Phentermine Hcl 37.5 Mg Caps (Phentermine hcl) .... One by mouth q am  Lipid Assessment/Plan:      Based on NCEP/ATP III, the patient's risk factor category is "2 or more risk factors and a calculated 10 year CAD risk of < 20%".   The patient's lipid goals are as follows: Total cholesterol goal is 200; LDL cholesterol goal is 130; HDL cholesterol goal is 40; Triglyceride goal is 150.  His LDL cholesterol goal has not been met.  Secondary causes for hyperlipidemia have been ruled out.  He has been counseled on adjunctive measures for lowering his cholesterol and has been provided with dietary instructions.    Patient Instructions: 1)  Please schedule a follow-up appointment in 3 months. Prescriptions: PHENTERMINE HCL 37.5 MG CAPS (PHENTERMINE HCL) one by mouth q AM  #30 x 3   Entered and Authorized by:   Ricard Dillon MD   Signed by:   Ricard Dillon MD on 12/29/2009   Method used:   Print then Give to Patient   RxID:   [id number]

## 2010-10-13 NOTE — Assessment & Plan Note (Signed)
Summary: 3 month rov/njr pt rsc/njr----PT [facility] // RS   Vital Signs:  Patient profile:   55 year old male Height:      69 inches Weight:      270 pounds BMI:     40.02 Temp:     98.2 degrees F oral Pulse rate:   72 / minute Resp:     14 per minute BP sitting:   130 / 84  (left arm) Cuff size:   large  Vitals Entered By: Allyne Gee, LPN (October 14, 9483 10:53 AM)  Nutrition Counseling: Patient's BMI is greater than 25 and therefore counseled on weight management options. CC: roa-refills on meds, Type 2 diabetes mellitus follow-up   CC:  roa-refills on meds and Type 2 diabetes mellitus follow-up.  History of Present Illness: follow up DM  Follow-Up Visit      This is a 55 year old man who presents for Follow-up visit.  The patient denies chest pain, palpitations, dizziness, syncope, low blood sugar symptoms, high blood sugar symptoms, edema, SOB, DOE, PND, and orthopnea.  Since the last visit the patient notes no new problems or concerns.  The patient reports taking meds as prescribed.  When questioned about possible medication side effects, the patient notes none.    Type 2 Diabetes Mellitus Follow-Up      The patient is also here for Type 2 diabetes mellitus follow-up.  The patient denies polyuria, polydipsia, blurred vision, self managed hypoglycemia, hypoglycemia requiring help, weight loss, weight gain, and numbness of extremities.  The patient denies the following symptoms: neuropathic pain, chest pain, vomiting, orthostatic symptoms, poor wound healing, intermittent claudication, vision loss, and foot ulcer.  Since the last visit the patient reports good dietary compliance and monitoring blood glucose.  The patient has been measuring capillary blood glucose before breakfast.    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 Once Daily  Off and On 2)  Flonase 50 Mcg/act  Susp (Fluticasone Propionate) .... As Needed 3)  Zyrtec Allergy 10 Mg  Tabs (Cetirizine Hcl) .... Two Times A Day As Needed 4)  Bl Glucosamine-Chondroitin 500-400 Mg  Tabs (Glucosamine-Chondroitin) .... Once Daily 5)  Fish Oil Concentrate 1000 Mg  Caps (Omega-3 Fatty Acids) .... One By Mouth Three Times A Day 6)  Trilipix 135 Mg  Cpdr (Choline Fenofibrate) .... One By Mouth Daily 7)  Janumet 50-500 Mg Tabs (Sitagliptin-Metformin Hcl) .... One By Mouth Two Times A Day 8)  Freestyle Test  Strp (Glucose Blood) .[name].. 1 Once Daily 9)  Freestyle Lancets  Misc (Lancets) .... Use As Directed  Current Medications (verified): 1)  Multivitamins   Caps (Multiple Vitamin) .... Once Daily  Off and On 2)  Flonase 50 Mcg/act  Susp (Fluticasone Propionate) .Marland KitchenMarland KitchenMarland Kitchen  As Needed 3)  Zyrtec Allergy 10 Mg  Tabs (Cetirizine Hcl) .... Two Times A Day As Needed 4)  Bl Glucosamine-Chondroitin 500-400 Mg  Tabs (Glucosamine-Chondroitin) .... Once Daily 5)  Fish Oil Concentrate 1000 Mg  Caps (Omega-3 Fatty Acids) .... One By Mouth Three Times A Day 6)  Trilipix 135 Mg  Cpdr (Choline Fenofibrate) .... One By Mouth Daily 7)  Janumet 50-500 Mg Tabs (Sitagliptin-Metformin Hcl) .... One By Mouth Two Times A Day 8)  Freestyle Test  Strp (Glucose  Blood) .[name].. 1 Once Daily 9)  Freestyle Lancets  Misc (Lancets) .... Use As Directed  Allergies (verified): No Known Drug Allergies  Past History:  Family History: Last updated: 06/09/2007 Father alive at b3 Family History Hypertension Mother  Social History: Last updated: 06/09/2007 Married Never Smoked Alcohol use-yes Drug use-no Regular exercise-no  Risk Factors: Exercise: no (06/09/2007)  Risk Factors: Smoking Status: never (10/13/2009)  Past medical, surgical, family and social histories (including risk factors) reviewed, and no changes noted (except as noted below).  Past Medical History: Reviewed history from 05/12/2007 and no changes required. Allergic rhinitis Hyperlipidemia  Past Surgical History: Reviewed history from 06/09/2007 and no changes required. 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 Father alive at b3 Family History Hypertension Mother  Social History: Reviewed history from 06/09/2007 and no changes required. Married Never Smoked Alcohol use-yes Drug use-no Regular exercise-no  Review of Systems  The patient denies anorexia, fever, weight loss, weight gain, vision loss, decreased hearing, hoarseness, chest pain, syncope, dyspnea on exertion, peripheral edema, prolonged cough, headaches, hemoptysis, abdominal pain, melena, hematochezia, severe indigestion/heartburn, hematuria, incontinence, genital sores, muscle weakness, suspicious skin lesions, transient blindness, difficulty walking, depression, unusual weight change, abnormal bleeding, enlarged lymph nodes, angioedema, and breast masses.    Physical Exam  General:  Well-developed,well-nourished,in no acute distress; alert,appropriate and cooperative throughout examination Head:  Normocephalic and atraumatic without obvious abnormalities. No apparent alopecia or balding. Eyes:  pupils equal and pupils round.     Ears:  R ear normal and L ear normal.   Nose:  external deformity.   Mouth:  pharynx pink and moist.   Neck:  No deformities, masses, or tenderness noted. Lungs:  Normal respiratory effort, chest expands symmetrically. Lungs are clear to auscultation, no crackles or wheezes. Heart:  Normal rate and regular rhythm. S1 and S2 normal without gallop, murmur, click, rub or other extra sounds. Abdomen:  soft, non-tender, and distended.  obese normal bowell sounds Msk:  No deformity or scoliosis noted of thoracic or lumbar spine.    Diabetes Management Exam:    Foot Exam (with socks and/or shoes not present):       Sensory-Pinprick/Light touch:          Left medial foot (L-4): diminished          Left dorsal foot (L-5): diminished          Left lateral foot (S-1): diminished          Right medial foot (L-4): diminished          Right dorsal foot (L-5): diminished          Right lateral foot (S-1): diminished   Impression & Recommendations:  Problem # 1:  HYPERLIPIDEMIA (ICD-272.4) Assessment Improved  His updated medication list for this problem includes:    Trilipix 135 Mg Cpdr (Choline fenofibrate) ..... One by mouth daily  Labs Reviewed: SGOT: 43 (06/10/2009)  SGPT: 62 (06/10/2009)  Lipid Goals: Chol Goal: 200 (06/09/2007)   HDL Goal: 40 (06/09/2007)   LDL Goal: 130 (06/09/2007)   TG Goal: 150 (06/09/2007)  Prior 10 Yr Risk Heart Disease: 11 % (06/10/2009)   HDL:32.60 (06/10/2009), 32.90 (01/20/2009)  LDL:127 (04/22/2008), 145 (01/23/2008)  Chol:182 (06/10/2009), 239 (01/20/2009)  Trig:635.0 (01/20/2009), 143 (04/22/2008)  Problem # 2:  DYSMETABOLIC SYNDROME X (EAV-409.8) Assessment: Improved the a1c normalized on the janumet two times a day to 6.0  Problem # 3:  BACK PAIN WITH RADICULOPATHY (ICD-729.2) controlled  with exercize walking on a tedmill three times a week  Complete Medication List: 1)  Multivitamins Caps (Multiple vitamin) .... Once daily  off and on 2)   Flonase 50 Mcg/act Susp (Fluticasone propionate) .... As needed 3)  Zyrtec Allergy 10 Mg Tabs (Cetirizine hcl) .... Two times a day as needed 4)  Bl Glucosamine-chondroitin 500-400 Mg Tabs (Glucosamine-chondroitin) .... Once daily 5)  Fish Oil Concentrate 1000 Mg Caps (Omega-3 fatty acids) .... One by mouth three times a day 6)  Trilipix 135 Mg Cpdr (Choline fenofibrate) .... One by mouth daily 7)  Janumet 50-500 Mg Tabs (Sitagliptin-metformin hcl) .... One by mouth two times a day 8)  Freestyle Test Strp (Glucose blood) .[name].. 1 once daily 9)  Freestyle Lancets Misc (Lancets) .... Use as directed  Patient Instructions: 1)  Please schedule a follow-up appointment in 3 months. 2)  Hepatic Panel prior to visit, ICD-9:995.20 3)  Lipid Panel prior to visit, ICD-9:272.4 4)  HbgA1C prior to visit, ICD-9: 250.00 Prescriptions: JANUMET 50-500 MG TABS (SITAGLIPTIN-METFORMIN HCL) one by mouth two times a day  #180 x 3   Entered by:   Allyne Gee, LPN   Authorized by:   Ricard Dillon MD   Signed by:   Allyne Gee, LPN on 11/91/4782   Method used:   Print then Give to Patient   RxID:   [id number] TRILIPIX 135 MG  CPDR (CHOLINE FENOFIBRATE) one by mouth daily  #90 x 3   Entered by:   Allyne Gee, LPN   Authorized by:   Ricard Dillon MD   Signed by:   Allyne Gee, LPN on 29/52/8413   Method used:   Print then Give to Patient   RxID:   [id number]

## 2010-11-03 ENCOUNTER — Ambulatory Visit: Payer: Self-pay | Admitting: Internal Medicine

## 2010-11-25 ENCOUNTER — Ambulatory Visit (INDEPENDENT_AMBULATORY_CARE_PROVIDER_SITE_OTHER): Payer: 59 | Admitting: Internal Medicine

## 2010-11-25 ENCOUNTER — Encounter: Payer: Self-pay | Admitting: Internal Medicine

## 2010-11-25 VITALS — BP 144/80 | HR 88 | Temp 98.3°F | Resp 14 | Ht 69.0 in | Wt 260.0 lb

## 2010-11-25 DIAGNOSIS — E785 Hyperlipidemia, unspecified: Secondary | ICD-10-CM

## 2010-11-25 DIAGNOSIS — E8881 Metabolic syndrome: Secondary | ICD-10-CM

## 2010-11-25 DIAGNOSIS — IMO0002 Reserved for concepts with insufficient information to code with codable children: Secondary | ICD-10-CM

## 2010-11-25 LAB — LIPID PANEL
LDL Cholesterol: 126 mg/dL — ABNORMAL HIGH (ref 0–99)
Total CHOL/HDL Ratio: 5

## 2010-11-25 NOTE — Assessment & Plan Note (Signed)
With the first year of his weight loss his cholesterol dropped significantly it would be important to document how the cholesterol is moving with the additional weight loss there is a potential that if he maintains his weight loss for a year that we could discontinue the Trileptal 6

## 2010-11-25 NOTE — Progress Notes (Signed)
  Subjective:    Patient ID: Mark Frey, male    DOB: 1955-09-29, 55 y.o.   MRN: [id number]  HPI  sense for him for monitoring morbid obesity and despite a block syndrome with elevated blood glucoses he has successfully lost an additional 8 pounds through monitoring of his portion sizes and using the treadmill for exercise  5 days a week  Blood pressure is in control and he states that his blood sugars have been in the normal range with his weight loss today we will draw appropriate monitoring labs including a hemoglobin A1c.  He denies any chest pain shortness of breath while exercising   Review of Systems  Constitutional: Negative for fever and fatigue.  HENT: Negative for hearing loss, congestion, neck pain and postnasal drip.   Eyes: Negative for discharge, redness and visual disturbance.  Respiratory: Negative for cough, shortness of breath and wheezing.   Cardiovascular: Negative for leg swelling.  Gastrointestinal: Negative for abdominal pain, constipation and abdominal distention.  Genitourinary: Negative for urgency and frequency.  Musculoskeletal: Negative for joint swelling and arthralgias.  Skin: Negative for color change and rash.  Neurological: Negative for weakness and light-headedness.  Hematological: Negative for adenopathy.  Psychiatric/Behavioral: Negative for behavioral problems.       Past Medical History  Diagnosis Date  . ALLERGIC RHINITIS   . Hyperlipidemia    Past Surgical History  Procedure Date  . Tonsillectomy   . Cleft palate repair     reports that he has never smoked. He does not have any smokeless tobacco history on file. He reports that he does not drink alcohol or use illicit drugs. family history is not on file. Allergies not on file  Objective:   Physical Exam  Constitutional: He is oriented to person, place, and time. He appears well-developed and well-nourished.  HENT:  Head: Normocephalic and atraumatic.  Eyes: Conjunctivae are  normal. Pupils are equal, round, and reactive to light.  Neck: Normal range of motion. Neck supple.  Cardiovascular: Normal rate and regular rhythm.   Pulmonary/Chest: Effort normal and breath sounds normal.  Abdominal: Soft. Bowel sounds are normal.  Neurological: He is alert and oriented to person, place, and time.  Skin: Skin is warm and dry.  Psychiatric: He has a normal mood and affect. His behavior is normal.          Assessment & Plan:   the patient is moving towards his goal weight loss he has lost an additional 8 pounds suspected cysts will have a significant impact on his hemoglobin A1c as well as his hyperlipidemia we'll monitor blood work today on both of those lab parameters

## 2010-11-25 NOTE — Assessment & Plan Note (Signed)
Weight loss program has been successful we Will continue his current diet and exercise protocol

## 2010-11-25 NOTE — Assessment & Plan Note (Signed)
Patient's weight reduction should the reflected in his hemoglobin A1c we will draw a hemoglobin A1c today. Blood glucose monitoring has been in the normal range

## 2010-11-25 NOTE — Assessment & Plan Note (Signed)
Back pain

## 2011-02-25 ENCOUNTER — Ambulatory Visit: Payer: 59 | Admitting: Internal Medicine

## 2011-03-12 ENCOUNTER — Ambulatory Visit: Payer: 59 | Admitting: Internal Medicine

## 2011-04-01 ENCOUNTER — Ambulatory Visit (INDEPENDENT_AMBULATORY_CARE_PROVIDER_SITE_OTHER): Payer: 59 | Admitting: Internal Medicine

## 2011-04-01 ENCOUNTER — Encounter: Payer: Self-pay | Admitting: Internal Medicine

## 2011-04-01 DIAGNOSIS — E785 Hyperlipidemia, unspecified: Secondary | ICD-10-CM

## 2011-04-01 LAB — HEMOGLOBIN A1C: Hgb A1c MFr Bld: 6.3 % (ref 4.6–6.5)

## 2011-04-01 LAB — LIPID PANEL
Cholesterol: 184 mg/dL (ref 0–200)
Triglycerides: 115 mg/dL (ref 0.0–149.0)

## 2011-04-01 NOTE — Progress Notes (Signed)
  Subjective:    Patient ID: Mark Frey, male    DOB: 04-09-1956, 55 y.o.   MRN: [id number]  HPI Patient presents for followup of hyperlipidemia diabetes and weight management.  Patient is moderately obese 55 year old white male who has adult onset diabetes and hyperlipidemia as well as a history of allergic rhinitis irritable bowel syndrome and back pain.  He has not lost weight since his last office visit and we discussed the failure to execute his plan as part of the continued problems with his diabetes control his lipid control and worsening back pain and musculoskeletal pain.  He expressed some recognition of the causal role that his weight plays in his medical problems.     Review of Systems  Constitutional: Negative for fever and fatigue.  HENT: Negative for hearing loss, congestion, neck pain and postnasal drip.   Eyes: Negative for discharge, redness and visual disturbance.  Respiratory: Negative for cough, shortness of breath and wheezing.   Cardiovascular: Negative for leg swelling.  Gastrointestinal: Negative for abdominal pain, constipation and abdominal distention.  Genitourinary: Negative for urgency and frequency.  Musculoskeletal: Negative for joint swelling and arthralgias.  Skin: Negative for color change and rash.  Neurological: Negative for weakness and light-headedness.  Hematological: Negative for adenopathy.  Psychiatric/Behavioral: Negative for behavioral problems.   Past Medical History  Diagnosis Date  . ALLERGIC RHINITIS   . Hyperlipidemia   . Diabetes mellitus    Past Surgical History  Procedure Date  . Tonsillectomy   . Cleft palate repair     reports that he has never smoked. He does not have any smokeless tobacco history on file. He reports that he does not drink alcohol or use illicit drugs. family history is not on file. No Known Allergies      Objective:   Physical Exam  Constitutional: He appears well-developed and  well-nourished.       Obese white male in no apparent distress  HENT:  Head: Normocephalic and atraumatic.  Eyes: Conjunctivae are normal. Pupils are equal, round, and reactive to light.  Neck: Normal range of motion. Neck supple.  Cardiovascular: Normal rate and regular rhythm.   Pulmonary/Chest: Effort normal and breath sounds normal.  Abdominal: Soft. Bowel sounds are normal.          Assessment & Plan:  Did not loose the weight as promised.  His A1c was 6.3 today which is stable but I would like to see this patient lose weight and get off more of his medications rather than require additional medications for his diabetes and back pain.  We discussed portion control exercise considering dividing the food on his plate in half and the leading half before finishing the meal to see if he can cut his caloric intake.  We discussed the role of exercise and controlling his diabetes as well.  We will come back in 4 months with a weight loss goal of 10 pounds

## 2011-08-12 ENCOUNTER — Ambulatory Visit: Payer: 59 | Admitting: Internal Medicine

## 2011-08-15 ENCOUNTER — Other Ambulatory Visit: Payer: Self-pay | Admitting: Internal Medicine

## 2011-08-27 ENCOUNTER — Other Ambulatory Visit: Payer: Self-pay | Admitting: Internal Medicine

## 2011-10-01 ENCOUNTER — Ambulatory Visit: Payer: 59 | Admitting: Internal Medicine

## 2011-11-05 ENCOUNTER — Encounter: Payer: Self-pay | Admitting: Internal Medicine

## 2011-11-05 ENCOUNTER — Ambulatory Visit (INDEPENDENT_AMBULATORY_CARE_PROVIDER_SITE_OTHER): Payer: 59 | Admitting: Internal Medicine

## 2011-11-05 VITALS — BP 130/80 | HR 76 | Temp 98.2°F | Resp 16 | Ht 69.0 in | Wt 268.0 lb

## 2011-11-05 DIAGNOSIS — IMO0001 Reserved for inherently not codable concepts without codable children: Secondary | ICD-10-CM

## 2011-11-05 DIAGNOSIS — I10 Essential (primary) hypertension: Secondary | ICD-10-CM

## 2011-11-05 DIAGNOSIS — Z0000 Encounter for general adult medical examination without abnormal findings: Secondary | ICD-10-CM

## 2011-11-05 DIAGNOSIS — E669 Obesity, unspecified: Secondary | ICD-10-CM

## 2011-11-05 NOTE — Patient Instructions (Signed)
The patient is instructed to continue all medications as prescribed. Schedule followup with check out clerk upon leaving the clinic  

## 2011-11-05 NOTE — Progress Notes (Signed)
Subjective:    Patient ID: Mark Frey, male    DOB: 02-29-1956, 56 y.o.   MRN: [id number]  HPI  Patient is a 56 year old white male who presents for followup for diabetes and hyperlipidemia.  He has gained 6 pounds since his last office visit we addressed the impact of weight gain on both his diabetes his hyperlipidemia.  We reviewed his last A1c which was between 6 and 6.5 in the goal range his CBGs are between 120.  We reviewed his diet and exercise he recently increased exercise but also increased his diet  Review of Systems  Constitutional: Negative for fever and fatigue.  HENT: Negative for hearing loss, congestion, neck pain and postnasal drip.   Eyes: Negative for discharge, redness and visual disturbance.  Respiratory: Negative for cough, shortness of breath and wheezing.   Cardiovascular: Negative for leg swelling.  Gastrointestinal: Negative for abdominal pain, constipation and abdominal distention.  Genitourinary: Negative for urgency and frequency.  Musculoskeletal: Negative for joint swelling and arthralgias.  Skin: Negative for color change and rash.  Neurological: Negative for weakness and light-headedness.  Hematological: Negative for adenopathy.  Psychiatric/Behavioral: Negative for behavioral problems.   Past Medical History  Diagnosis Date  . ALLERGIC RHINITIS   . Hyperlipidemia   . Diabetes mellitus     History   Social History  . Marital Status: Married    Spouse Name: N/A    Number of Children: N/A  . Years of Education: N/A   Occupational History  . Not on file.   Social History Main Topics  . Smoking status: Never Smoker   . Smokeless tobacco: Not on file  . Alcohol Use: No  . Drug Use: No  . Sexually Active: Yes   Other Topics Concern  . Not on file   Social History Narrative  . No narrative on file    Past Surgical History  Procedure Date  . Tonsillectomy   . Cleft palate repair     No family history on file.  No Known  Allergies  Current Outpatient Prescriptions on File Prior to Visit  Medication Sig Dispense Refill  . cetirizine (ZYRTEC) 10 MG tablet Take 10 mg by mouth 2 (two) times daily as needed.        . fish oil-omega-3 fatty acids 1000 MG capsule Take 1 g by mouth 3 (three) times daily.        . fluticasone (FLONASE) 50 MCG/ACT nasal spray 1 spray by Nasal route as needed.        [name] glucosamine-chondroitin 500-400 MG tablet Take 1 tablet by mouth daily.        [name] glucose blood (FREESTYLE TEST STRIPS) test strip 1 each by Other route daily. Use as instructed       . JANUMET 50-500 MG per tablet TAKE 1 TABLET TWICE A DAY  180 tablet  2  . Lancets (FREESTYLE) lancets 1 each by Other route as directed. Use as instructed       . Multiple Vitamin (MULTIVITAMIN) capsule Take 1 capsule by mouth daily.        . TRILIPIX 135 MG capsule TAKE 1 CAPSULE DAILY  90 capsule  2    BP 130/80  Pulse 76  Temp 98.2 F (36.8 C)  Resp 16  Ht 5' 9"  (1.753 m)  Wt 268 lb (121.564 kg)  BMI 39.58 kg/m2        Objective:   Physical Exam  Nursing note and vitals reviewed. Constitutional: He appears  well-developed and well-nourished.  HENT:  Head: Normocephalic and atraumatic.  Eyes: Conjunctivae are normal. Pupils are equal, round, and reactive to light.  Neck: Normal range of motion. Neck supple.  Cardiovascular: Normal rate and regular rhythm.   Pulmonary/Chest: Effort normal and breath sounds normal.  Abdominal: Soft. Bowel sounds are normal.          Assessment & Plan:  The patient has stable blood pressure.  His diabetes is moderately well-controlled but weight gain would suggest a control may be tending towards poor control we addressed diet exercise monitoring of CBGs will set up a complete physical examination 4 months at which time we will get additional A1c currently is A1c is in the goal range.  We talked about his morbid obesity the risks of morbid obesity.  We reviewed his lipid panel for  his prior blood work as well.

## 2012-02-25 ENCOUNTER — Other Ambulatory Visit (INDEPENDENT_AMBULATORY_CARE_PROVIDER_SITE_OTHER): Payer: 59

## 2012-02-25 DIAGNOSIS — Z0000 Encounter for general adult medical examination without abnormal findings: Secondary | ICD-10-CM

## 2012-02-25 LAB — CBC WITH DIFFERENTIAL/PLATELET
Basophils Relative: 0.5 % (ref 0.0–3.0)
HCT: 44.7 % (ref 39.0–52.0)
Hemoglobin: 15.1 g/dL (ref 13.0–17.0)
Lymphocytes Relative: 38.1 % (ref 12.0–46.0)
Lymphs Abs: 2.4 10*3/uL (ref 0.7–4.0)
MCHC: 33.8 g/dL (ref 30.0–36.0)
Monocytes Relative: 8.5 % (ref 3.0–12.0)
Neutro Abs: 3.3 10*3/uL (ref 1.4–7.7)
RBC: 4.72 Mil/uL (ref 4.22–5.81)

## 2012-02-25 LAB — LIPID PANEL
Cholesterol: 166 mg/dL (ref 0–200)
LDL Cholesterol: 103 mg/dL — ABNORMAL HIGH (ref 0–99)
Total CHOL/HDL Ratio: 4
Triglycerides: 123 mg/dL (ref 0.0–149.0)

## 2012-02-25 LAB — BASIC METABOLIC PANEL
BUN: 19 mg/dL (ref 6–23)
CO2: 27 mEq/L (ref 19–32)
Chloride: 104 mEq/L (ref 96–112)
Creatinine, Ser: 1.2 mg/dL (ref 0.4–1.5)
Potassium: 4.5 mEq/L (ref 3.5–5.1)

## 2012-02-25 LAB — POCT URINALYSIS DIPSTICK
Bilirubin, UA: NEGATIVE
Blood, UA: NEGATIVE
Glucose, UA: NEGATIVE
Ketones, UA: NEGATIVE
Nitrite, UA: NEGATIVE
Spec Grav, UA: 1.02
pH, UA: 5.5

## 2012-02-25 LAB — HEPATIC FUNCTION PANEL
ALT: 43 U/L (ref 0–53)
AST: 29 U/L (ref 0–37)
Total Bilirubin: 0.7 mg/dL (ref 0.3–1.2)
Total Protein: 7.5 g/dL (ref 6.0–8.3)

## 2012-02-25 LAB — HEMOGLOBIN A1C: Hgb A1c MFr Bld: 6.1 % (ref 4.6–6.5)

## 2012-03-03 ENCOUNTER — Encounter: Payer: Self-pay | Admitting: Internal Medicine

## 2012-03-03 ENCOUNTER — Ambulatory Visit (INDEPENDENT_AMBULATORY_CARE_PROVIDER_SITE_OTHER): Payer: 59 | Admitting: Internal Medicine

## 2012-03-03 VITALS — BP 130/80 | HR 72 | Temp 98.2°F | Resp 16 | Ht 69.0 in | Wt 260.0 lb

## 2012-03-03 DIAGNOSIS — Z0000 Encounter for general adult medical examination without abnormal findings: Secondary | ICD-10-CM

## 2012-03-03 NOTE — Progress Notes (Signed)
  Subjective:    Patient ID: Mark Frey, male    DOB: 04-05-1956, 56 y.o.   MRN: [id number]  HPI  PRESENTS FOR YEARLY EXAM  Review of Systems  Constitutional: Negative for fever and fatigue.  HENT: Negative for hearing loss, congestion, neck pain and postnasal drip.   Eyes: Negative for discharge, redness and visual disturbance.  Respiratory: Negative for cough, shortness of breath and wheezing.   Cardiovascular: Negative for leg swelling.  Gastrointestinal: Negative for abdominal pain, constipation and abdominal distention.  Genitourinary: Negative for urgency and frequency.  Musculoskeletal: Negative for joint swelling and arthralgias.  Skin: Negative for color change and rash.  Neurological: Negative for weakness and light-headedness.  Hematological: Negative for adenopathy.  Psychiatric/Behavioral: Negative for behavioral problems.       Objective:   Physical Exam  Nursing note and vitals reviewed. Constitutional: He is oriented to person, place, and time. He appears well-developed and well-nourished.  HENT:  Head: Normocephalic and atraumatic.  Eyes: Conjunctivae are normal. Pupils are equal, round, and reactive to light.  Neck: Normal range of motion. Neck supple.  Cardiovascular: Normal rate and regular rhythm.   Pulmonary/Chest: Effort normal and breath sounds normal.  Abdominal: Soft. Bowel sounds are normal.  Genitourinary: Rectum normal, prostate normal and penis normal.       PROSTATE 45 GRAMS  Musculoskeletal: Normal range of motion.  Neurological: He is alert and oriented to person, place, and time.  Skin: Skin is warm and dry.  Psychiatric: He has a normal mood and affect. His behavior is normal.          Assessment & Plan:   Patient presents for yearly preventative medicine examination.   all immunizations and health maintenance protocols were reviewed with the patient and they are up to date with these protocols.   screening laboratory values  were reviewed with the patient including screening of hyperlipidemia PSA renal function and hepatic function.   There medications past medical history social history problem list and allergies were reviewed in detail.   Goals were established with regard to weight loss exercise diet in compliance with medications   HAND OUT FOR [place] DIET GIVEN AND DISCUSSED

## 2012-03-03 NOTE — Patient Instructions (Addendum)
The patient is instructed to continue all medications as prescribed. Schedule followup with check out clerk upon leaving the clinic [place] DIET

## 2012-07-14 ENCOUNTER — Other Ambulatory Visit: Payer: Self-pay | Admitting: Internal Medicine

## 2012-08-08 ENCOUNTER — Other Ambulatory Visit: Payer: Self-pay | Admitting: Internal Medicine

## 2012-09-01 ENCOUNTER — Ambulatory Visit: Payer: 59 | Admitting: Internal Medicine

## 2012-09-20 ENCOUNTER — Ambulatory Visit: Payer: 59 | Admitting: Internal Medicine

## 2012-12-01 ENCOUNTER — Ambulatory Visit (INDEPENDENT_AMBULATORY_CARE_PROVIDER_SITE_OTHER): Payer: 59 | Admitting: Internal Medicine

## 2012-12-01 ENCOUNTER — Encounter: Payer: Self-pay | Admitting: Internal Medicine

## 2012-12-01 VITALS — BP 140/80 | HR 80 | Temp 98.6°F | Resp 16 | Ht 69.0 in | Wt 270.0 lb

## 2012-12-01 DIAGNOSIS — E785 Hyperlipidemia, unspecified: Secondary | ICD-10-CM

## 2012-12-01 DIAGNOSIS — K589 Irritable bowel syndrome without diarrhea: Secondary | ICD-10-CM

## 2012-12-01 DIAGNOSIS — E8881 Metabolic syndrome: Secondary | ICD-10-CM

## 2012-12-01 LAB — BASIC METABOLIC PANEL
CO2: 27 mEq/L (ref 19–32)
Chloride: 107 mEq/L (ref 96–112)
Creat: 1.26 mg/dL (ref 0.50–1.35)
Glucose, Bld: 92 mg/dL (ref 70–99)
Sodium: 141 mEq/L (ref 135–145)

## 2012-12-01 NOTE — Patient Instructions (Addendum)
The patient is instructed to continue all medications as prescribed. Schedule followup with check out clerk upon leaving the clinic  

## 2012-12-01 NOTE — Progress Notes (Signed)
Subjective:    Patient ID: Mark Frey, male    DOB: 05/27/1956, 57 y.o.   MRN: [id number]  HPI  Obesity and DM with persistent morbid obesity pattern  Review of Systems  Constitutional: Negative for fever and fatigue.  HENT: Negative for hearing loss, congestion, neck pain and postnasal drip.   Eyes: Negative for discharge, redness and visual disturbance.  Respiratory: Negative for cough, shortness of breath and wheezing.   Cardiovascular: Negative for leg swelling.  Gastrointestinal: Negative for abdominal pain, constipation and abdominal distention.  Genitourinary: Negative for urgency and frequency.  Musculoskeletal: Negative for joint swelling and arthralgias.  Skin: Negative for color change and rash.  Neurological: Negative for weakness and light-headedness.  Hematological: Negative for adenopathy.  Psychiatric/Behavioral: Negative for behavioral problems.       Past Medical History  Diagnosis Date  . ALLERGIC RHINITIS   . Hyperlipidemia   . Diabetes mellitus     History   Social History  . Marital Status: Married    Spouse Name: N/A    Number of Children: N/A  . Years of Education: N/A   Occupational History  . Not on file.   Social History Main Topics  . Smoking status: Never Smoker   . Smokeless tobacco: Not on file  . Alcohol Use: No  . Drug Use: No  . Sexually Active: Yes   Other Topics Concern  . Not on file   Social History Narrative  . No narrative on file    Past Surgical History  Procedure Laterality Date  . Tonsillectomy    . Cleft palate repair      History reviewed. No pertinent family history.  No Known Allergies  Current Outpatient Prescriptions on File Prior to Visit  Medication Sig Dispense Refill  . cetirizine (ZYRTEC) 10 MG tablet Take 10 mg by mouth 2 (two) times daily as needed.        . Choline Fenofibrate (FENOFIBRIC ACID) 135 MG CPDR TAKE 1 CAPSULE DAILY  90 capsule  1  . fish oil-omega-3 fatty acids 1000 MG  capsule Take 1 g by mouth 3 (three) times daily.        . fluticasone (FLONASE) 50 MCG/ACT nasal spray 1 spray by Nasal route as needed.        [name] glucosamine-chondroitin 500-400 MG tablet Take 1 tablet by mouth daily.        [name] glucose blood (FREESTYLE TEST STRIPS) test strip 1 each by Other route daily. Use as instructed       . JANUMET 50-500 MG per tablet TAKE 1 TABLET TWICE A DAY  180 tablet  1  . Lancets (FREESTYLE) lancets 1 each by Other route as directed. Use as instructed       . Multiple Vitamin (MULTIVITAMIN) capsule Take 1 capsule by mouth daily.         No current facility-administered medications on file prior to visit.    BP 140/80  Pulse 80  Temp(Src) 98.6 F (37 C)  Resp 16  Ht 5' 9"  (1.753 m)  Wt 270 lb (122.471 kg)  BMI 39.85 kg/m2    Objective:   Physical Exam  Constitutional: He appears well-developed and well-nourished.  HENT:  Head: Normocephalic and atraumatic.  Eyes: Conjunctivae are normal. Pupils are equal, round, and reactive to light.  Neck: Normal range of motion. Neck supple.  Cardiovascular: Normal rate and regular rhythm.   Pulmonary/Chest: Effort normal and breath sounds normal.  Abdominal: Soft. Bowel sounds are normal.  Assessment & Plan:  disucussed risks of DM and will monitor a1c CPX in 4 months

## 2013-01-10 ENCOUNTER — Other Ambulatory Visit: Payer: Self-pay | Admitting: Internal Medicine

## 2013-02-06 ENCOUNTER — Other Ambulatory Visit: Payer: Self-pay | Admitting: Internal Medicine

## 2013-04-07 ENCOUNTER — Other Ambulatory Visit: Payer: Self-pay | Admitting: Internal Medicine

## 2013-05-01 ENCOUNTER — Telehealth: Payer: Self-pay | Admitting: Internal Medicine

## 2013-05-01 NOTE — Telephone Encounter (Signed)
PT called and stated that he would like to be tested for limes disease at the time of his lab appt tomorrow morning. Please assist.

## 2013-05-01 NOTE — Telephone Encounter (Signed)
Talked with gwenn and she will try to find diagnoses for lime disease before she draws.

## 2013-05-02 ENCOUNTER — Other Ambulatory Visit (INDEPENDENT_AMBULATORY_CARE_PROVIDER_SITE_OTHER): Payer: 59

## 2013-05-02 DIAGNOSIS — Z0000 Encounter for general adult medical examination without abnormal findings: Secondary | ICD-10-CM

## 2013-05-02 LAB — MICROALBUMIN / CREATININE URINE RATIO: Microalb Creat Ratio: 1.6 mg/g (ref 0.0–30.0)

## 2013-05-02 LAB — BASIC METABOLIC PANEL
BUN: 15 mg/dL (ref 6–23)
CO2: 27 mEq/L (ref 19–32)
Glucose, Bld: 119 mg/dL — ABNORMAL HIGH (ref 70–99)
Potassium: 5 mEq/L (ref 3.5–5.1)
Sodium: 137 mEq/L (ref 135–145)

## 2013-05-02 LAB — POCT URINALYSIS DIPSTICK
Bilirubin, UA: NEGATIVE
Blood, UA: NEGATIVE
Ketones, UA: NEGATIVE
Protein, UA: NEGATIVE
Spec Grav, UA: 1.02
pH, UA: 7

## 2013-05-02 LAB — CBC WITH DIFFERENTIAL/PLATELET
Basophils Absolute: 0 10*3/uL (ref 0.0–0.1)
Basophils Relative: 0.6 % (ref 0.0–3.0)
Eosinophils Relative: 1.8 % (ref 0.0–5.0)
HCT: 42.7 % (ref 39.0–52.0)
Hemoglobin: 14.5 g/dL (ref 13.0–17.0)
Lymphs Abs: 2.3 10*3/uL (ref 0.7–4.0)
Monocytes Relative: 9.3 % (ref 3.0–12.0)
Neutro Abs: 2.8 10*3/uL (ref 1.4–7.7)
RBC: 4.59 Mil/uL (ref 4.22–5.81)
RDW: 13.6 % (ref 11.5–14.6)

## 2013-05-02 LAB — LIPID PANEL
Cholesterol: 184 mg/dL (ref 0–200)
LDL Cholesterol: 128 mg/dL — ABNORMAL HIGH (ref 0–99)
Triglycerides: 125 mg/dL (ref 0.0–149.0)
VLDL: 25 mg/dL (ref 0.0–40.0)

## 2013-05-02 LAB — HEPATIC FUNCTION PANEL
ALT: 62 U/L — ABNORMAL HIGH (ref 0–53)
AST: 47 U/L — ABNORMAL HIGH (ref 0–37)
Albumin: 4.4 g/dL (ref 3.5–5.2)
Alkaline Phosphatase: 34 U/L — ABNORMAL LOW (ref 39–117)
Total Protein: 7.3 g/dL (ref 6.0–8.3)

## 2013-05-02 LAB — HEMOGLOBIN A1C: Hgb A1c MFr Bld: 6.6 % — ABNORMAL HIGH (ref 4.6–6.5)

## 2013-05-07 ENCOUNTER — Encounter: Payer: 59 | Admitting: Internal Medicine

## 2013-05-07 ENCOUNTER — Other Ambulatory Visit: Payer: Self-pay | Admitting: Internal Medicine

## 2013-05-09 ENCOUNTER — Encounter: Payer: 59 | Admitting: Internal Medicine

## 2013-05-22 ENCOUNTER — Ambulatory Visit (INDEPENDENT_AMBULATORY_CARE_PROVIDER_SITE_OTHER): Payer: 59 | Admitting: Internal Medicine

## 2013-05-22 ENCOUNTER — Encounter: Payer: Self-pay | Admitting: Internal Medicine

## 2013-05-22 VITALS — BP 144/90 | HR 76 | Temp 98.3°F | Resp 16 | Ht 69.0 in | Wt 270.0 lb

## 2013-05-22 DIAGNOSIS — Z0000 Encounter for general adult medical examination without abnormal findings: Secondary | ICD-10-CM

## 2013-05-22 NOTE — Progress Notes (Signed)
Subjective:    Patient ID: Mark Frey, male    DOB: 1956-01-28, 57 y.o.   MRN: [id number]  HPI Morbid obesity a1c 6.6 Blood pressure  Back stable New diagnosis AODM No allergies symtoms Needs tetus and flu shots   Review of Systems  Constitutional: Negative for fever and fatigue.  HENT: Negative for hearing loss, congestion, neck pain and postnasal drip.   Eyes: Negative for discharge, redness and visual disturbance.  Respiratory: Negative for cough, shortness of breath and wheezing.   Cardiovascular: Negative for leg swelling.  Gastrointestinal: Negative for abdominal pain, constipation and abdominal distention.  Genitourinary: Negative for urgency and frequency.  Musculoskeletal: Negative for joint swelling and arthralgias.  Skin: Negative for color change and rash.  Neurological: Negative for weakness and light-headedness.  Hematological: Negative for adenopathy.  Psychiatric/Behavioral: Negative for behavioral problems.   Past Medical History  Diagnosis Date  . ALLERGIC RHINITIS   . Hyperlipidemia   . Diabetes mellitus     History   Social History  . Marital Status: Married    Spouse Name: N/A    Number of Children: N/A  . Years of Education: N/A   Occupational History  . Not on file.   Social History Main Topics  . Smoking status: Never Smoker   . Smokeless tobacco: Not on file  . Alcohol Use: No  . Drug Use: No  . Sexual Activity: Yes   Other Topics Concern  . Not on file   Social History Narrative  . No narrative on file    Past Surgical History  Procedure Laterality Date  . Tonsillectomy    . Cleft palate repair      No family history on file.  No Known Allergies  Current Outpatient Prescriptions on File Prior to Visit  Medication Sig Dispense Refill  . cetirizine (ZYRTEC) 10 MG tablet Take 10 mg by mouth 2 (two) times daily as needed.        . Choline Fenofibrate (FENOFIBRIC ACID) 135 MG CPDR TAKE 1 CAPSULE DAILY  90 capsule  1   . fish oil-omega-3 fatty acids 1000 MG capsule Take 1 g by mouth 3 (three) times daily.        . fluticasone (FLONASE) 50 MCG/ACT nasal spray 1 spray by Nasal route as needed.        [name] glucosamine-chondroitin 500-400 MG tablet Take 1 tablet by mouth daily.        [name] glucose blood (FREESTYLE TEST STRIPS) test strip 1 each by Other route daily. Use as instructed       . JANUMET 50-500 MG per tablet TAKE 1 TABLET TWICE A DAY  180 tablet  3  . Lancets (FREESTYLE) lancets 1 each by Other route as directed. Use as instructed       . Multiple Vitamin (MULTIVITAMIN) capsule Take 1 capsule by mouth daily.         No current facility-administered medications on file prior to visit.    BP 144/90  Pulse 76  Temp(Src) 98.3 F (36.8 C)  Resp 16  Ht 5' 9"  (1.753 m)  Wt 270 lb (122.471 kg)  BMI 39.85 kg/m2        Objective:   Physical Exam  Constitutional: He appears well-developed and well-nourished.  HENT:  Head: Normocephalic and atraumatic.  Eyes: Conjunctivae are normal. Pupils are equal, round, and reactive to light.  Neck: Normal range of motion. Neck supple.  Cardiovascular: Normal rate and regular rhythm.   Murmur heard. Pulmonary/Chest:  Effort normal and breath sounds normal.  Abdominal: Soft. Bowel sounds are normal.  Genitourinary: Rectum normal and prostate normal.  Musculoskeletal: He exhibits edema and tenderness.  Neurological: No cranial nerve deficit. Coordination normal.  Skin: Skin is warm and dry.  Psychiatric: He has a normal mood and affect.          Assessment & Plan:  High statistical risk for MI or stroke  Patient presents for yearly preventative medicine examination.   all immunizations and health maintenance protocols were reviewed with the patient and they are up to date with these protocols.   screening laboratory values were reviewed with the patient including screening of hyperlipidemia PSA renal function and hepatic function.   There medications  past medical history social history problem list and allergies were reviewed in detail.   Goals were established with regard to weight loss exercise diet in compliance with medications  Patient is at high statistical risk from his obesity which is now morbid   hypertension and diabetes.  We counseled him that diet and weight loss are essential to turn this around and we'll do a 3 month trial.

## 2013-05-22 NOTE — Patient Instructions (Addendum)
Weight loss is our goal for the next 4 months we will come back at the end of 4 months to assess your weight reduction and to repeat hemoglobin A1c

## 2013-05-23 ENCOUNTER — Encounter: Payer: 59 | Admitting: Internal Medicine

## 2013-06-15 ENCOUNTER — Telehealth: Payer: Self-pay | Admitting: Internal Medicine

## 2013-06-15 MED ORDER — LORCASERIN HCL 10 MG PO TABS: 10.0000 mg | ORAL_TABLET | Freq: Every day | ORAL | Status: DC

## 2013-06-15 NOTE — Telephone Encounter (Signed)
done

## 2013-06-15 NOTE — Telephone Encounter (Signed)
Pt states that he lost his Belviqu 51m RX. He states that he accidentally threw it away, and he only has 1 day supply left. He would like an RX [facility] [address]. Please assist.

## 2013-06-18 ENCOUNTER — Other Ambulatory Visit: Payer: Self-pay | Admitting: *Deleted

## 2013-06-18 MED ORDER — LORCASERIN HCL 10 MG PO TABS: 10.0000 mg | ORAL_TABLET | Freq: Two times a day (BID) | ORAL | Status: DC

## 2013-09-26 ENCOUNTER — Ambulatory Visit (INDEPENDENT_AMBULATORY_CARE_PROVIDER_SITE_OTHER): Payer: BC Managed Care – PPO | Admitting: Internal Medicine

## 2013-09-26 ENCOUNTER — Encounter: Payer: Self-pay | Admitting: Internal Medicine

## 2013-09-26 DIAGNOSIS — E1165 Type 2 diabetes mellitus with hyperglycemia: Secondary | ICD-10-CM

## 2013-09-26 DIAGNOSIS — IMO0002 Reserved for concepts with insufficient information to code with codable children: Secondary | ICD-10-CM

## 2013-09-26 DIAGNOSIS — E1169 Type 2 diabetes mellitus with other specified complication: Secondary | ICD-10-CM

## 2013-09-26 MED ORDER — NALTREXONE-BUPROPION HCL ER 8-90 MG PO TB12: 1.0000 | ORAL_TABLET | Freq: Every morning | ORAL | Status: DC

## 2013-09-26 NOTE — Progress Notes (Signed)
   Subjective:    Patient ID: [name], male    DOB: [birth year], 58 y.o.   MRN: [id number]  HPI Follow up for morbid obesity management Failed phentermine and recently belviq     Review of Systems  Constitutional: Negative for fever and fatigue.  HENT: Negative for congestion, hearing loss and postnasal drip.   Eyes: Negative for discharge, redness and visual disturbance.  Respiratory: Negative for cough, shortness of breath and wheezing.   Cardiovascular: Negative for leg swelling.  Gastrointestinal: Negative for abdominal pain, constipation and abdominal distention.  Genitourinary: Negative for urgency and frequency.  Musculoskeletal: Negative for arthralgias, joint swelling and neck pain.  Skin: Negative for color change and rash.  Neurological: Negative for weakness and light-headedness.  Hematological: Negative for adenopathy.  Psychiatric/Behavioral: Negative for behavioral problems.       Objective:   Physical Exam  Constitutional: He appears well-developed and well-nourished.  HENT:  Head: Normocephalic and atraumatic.  Eyes: Conjunctivae are normal. Pupils are equal, round, and reactive to light.  Neck: Normal range of motion. Neck supple.  Cardiovascular: Normal rate and regular rhythm.   Pulmonary/Chest: Effort normal and breath sounds normal.  Abdominal: Soft. Bowel sounds are normal.          Assessment & Plan:  stable DM  New medication trial started

## 2013-09-26 NOTE — Progress Notes (Signed)
Pre visit review using our clinic review tool, if applicable. No additional management support is needed unless otherwise documented below in the visit note. 

## 2013-09-26 NOTE — Patient Instructions (Signed)
Call and we can send into you 90 mail order

## 2013-09-27 ENCOUNTER — Telehealth: Payer: Self-pay

## 2013-09-27 NOTE — Telephone Encounter (Signed)
Relevant patient education mailed to patient.  

## 2013-10-12 ENCOUNTER — Other Ambulatory Visit: Payer: Self-pay | Admitting: Internal Medicine

## 2014-01-18 ENCOUNTER — Telehealth: Payer: Self-pay | Admitting: Internal Medicine

## 2014-01-18 MED ORDER — BELVIQ 10 MG PO TABS: 10.0000 mg | ORAL_TABLET | Freq: Every day | ORAL | Status: DC

## 2014-01-18 MED ORDER — LORCASERIN HCL 10 MG PO TABS: 10.0000 mg | ORAL_TABLET | Freq: Every day | ORAL | Status: DC

## 2014-01-18 NOTE — Telephone Encounter (Signed)
Pt states the weight loss pill you had wanted him to try at last visit, he did not like the side effects and would like to go back on Lorcaserin HCl (BELVIQ) 10 MG TABS Pt would like that sent to [facility].   90 day refill then . will est w/ Dr Yong Channel

## 2014-01-18 NOTE — Telephone Encounter (Signed)
May rx the belviq

## 2014-01-18 NOTE — Telephone Encounter (Signed)
Pt request name brand belviq. Pt states his insurance will pay 100% for the name brand drug.  [facility] [address]

## 2014-01-21 ENCOUNTER — Telehealth: Payer: Self-pay | Admitting: Internal Medicine

## 2014-01-21 NOTE — Telephone Encounter (Signed)
PA for Belviq was denied.  Pt have greater than or equal to 5% or more BMI loss.

## 2014-01-22 NOTE — Telephone Encounter (Signed)
Pt would like someone to call him and explain the denial for name brand belviq.

## 2014-01-22 NOTE — Telephone Encounter (Signed)
Left message on machine returning patient's call

## 2014-01-22 NOTE — Telephone Encounter (Signed)
Spoke with patient and explained why insurance will not cover Belviq

## 2014-01-25 NOTE — Telephone Encounter (Signed)
May give brand name

## 2014-01-28 ENCOUNTER — Other Ambulatory Visit: Payer: Self-pay | Admitting: Internal Medicine

## 2014-01-30 ENCOUNTER — Ambulatory Visit: Payer: BC Managed Care – PPO | Admitting: Internal Medicine

## 2014-06-05 ENCOUNTER — Ambulatory Visit (INDEPENDENT_AMBULATORY_CARE_PROVIDER_SITE_OTHER): Payer: BC Managed Care – PPO | Admitting: Family Medicine

## 2014-06-05 ENCOUNTER — Encounter: Payer: Self-pay | Admitting: Family Medicine

## 2014-06-05 VITALS — BP 130/98 | HR 80 | Temp 98.3°F | Wt 277.0 lb

## 2014-06-05 DIAGNOSIS — E785 Hyperlipidemia, unspecified: Secondary | ICD-10-CM

## 2014-06-05 DIAGNOSIS — IMO0001 Reserved for inherently not codable concepts without codable children: Secondary | ICD-10-CM

## 2014-06-05 DIAGNOSIS — E119 Type 2 diabetes mellitus without complications: Secondary | ICD-10-CM

## 2014-06-05 DIAGNOSIS — I152 Hypertension secondary to endocrine disorders: Secondary | ICD-10-CM | POA: Insufficient documentation

## 2014-06-05 DIAGNOSIS — I10 Essential (primary) hypertension: Secondary | ICD-10-CM

## 2014-06-05 DIAGNOSIS — Z0000 Encounter for general adult medical examination without abnormal findings: Secondary | ICD-10-CM

## 2014-06-05 DIAGNOSIS — J309 Allergic rhinitis, unspecified: Secondary | ICD-10-CM

## 2014-06-05 DIAGNOSIS — E1159 Type 2 diabetes mellitus with other circulatory complications: Secondary | ICD-10-CM | POA: Insufficient documentation

## 2014-06-05 LAB — TSH: TSH: 0.72 u[IU]/mL (ref 0.35–4.50)

## 2014-06-05 LAB — HEMOGLOBIN A1C: Hgb A1c MFr Bld: 7.1 % — ABNORMAL HIGH (ref 4.6–6.5)

## 2014-06-05 LAB — LIPID PANEL
CHOL/HDL RATIO: 7
Cholesterol: 213 mg/dL — ABNORMAL HIGH (ref 0–200)
HDL: 32.4 mg/dL — AB (ref >39.00)
LDL Cholesterol: 149 mg/dL — ABNORMAL HIGH (ref 0–99)
NonHDL: 180.6
Triglycerides: 158 mg/dL — ABNORMAL HIGH (ref 0.0–149.0)
VLDL: 31.6 mg/dL (ref 0.0–40.0)

## 2014-06-05 LAB — CBC
HCT: 43 % (ref 39.0–52.0)
Hemoglobin: 14.7 g/dL (ref 13.0–17.0)
MCHC: 34.2 g/dL (ref 30.0–36.0)
MCV: 93.6 fl (ref 78.0–100.0)
PLATELETS: 249 10*3/uL (ref 150.0–400.0)
RBC: 4.59 Mil/uL (ref 4.22–5.81)
RDW: 13.7 % (ref 11.5–15.5)
WBC: 5.1 10*3/uL (ref 4.0–10.5)

## 2014-06-05 LAB — COMPREHENSIVE METABOLIC PANEL
ALBUMIN: 4.7 g/dL (ref 3.5–5.2)
ALK PHOS: 34 U/L — AB (ref 39–117)
ALT: 69 U/L — ABNORMAL HIGH (ref 0–53)
AST: 45 U/L — AB (ref 0–37)
BUN: 14 mg/dL (ref 6–23)
CALCIUM: 9.9 mg/dL (ref 8.4–10.5)
CHLORIDE: 104 meq/L (ref 96–112)
CO2: 28 mEq/L (ref 19–32)
Creatinine, Ser: 1.2 mg/dL (ref 0.4–1.5)
GFR: 63.66 mL/min (ref >60.00)
Glucose, Bld: 116 mg/dL — ABNORMAL HIGH (ref 70–99)
POTASSIUM: 4.7 meq/L (ref 3.5–5.1)
SODIUM: 138 meq/L (ref 135–145)
TOTAL PROTEIN: 7.9 g/dL (ref 6.0–8.3)
Total Bilirubin: 0.7 mg/dL (ref 0.2–1.2)

## 2014-06-05 LAB — MICROALBUMIN / CREATININE URINE RATIO
Creatinine,U: 138.7 mg/dL
MICROALB UR: 6.7 mg/dL — AB (ref 0.0–1.9)
Microalb Creat Ratio: 4.8 mg/g (ref 0.0–30.0)

## 2014-06-05 LAB — PSA: PSA: 0.94 ng/mL (ref 0.10–4.00)

## 2014-06-05 NOTE — Patient Instructions (Addendum)
Obesity  Advise increase exercise to 45 minutes per day (everyday)  Can refer to nutrition or diabetes education  You wanted to start with checking out weight loss center or jenny craig or weight watchers  Could consider gastric bypass once we have tried all other options.   High Blood pressure  You do have this  You decided to focus on weight loss first which I think is agreeable. See Deliah Boston diet below   Diabetes  a1c today and urine test for kidney damage  Will call you with results.  See me within 2 months for physical. Do not have to be fasting at that time since doing labs today.  Front desk-may be 3rd 30 minute visit per 1/2 day.   DASH Eating Plan DASH stands for "Dietary Approaches to Stop Hypertension." The DASH eating plan is a healthy eating plan that has been shown to reduce high blood pressure (hypertension). Additional health benefits may include reducing the risk of type 2 diabetes mellitus, heart disease, and stroke. The DASH eating plan may also help with weight loss. WHAT DO I NEED TO KNOW ABOUT THE DASH EATING PLAN? For the DASH eating plan, you will follow these general guidelines:  Choose foods with a percent daily value for sodium of less than 5% (as listed on the food label).  Use salt-free seasonings or herbs instead of table salt or sea salt.  Check with your health care provider or pharmacist before using salt substitutes.  Eat lower-sodium products, often labeled as "lower sodium" or "no salt added."  Eat fresh foods.  Eat more vegetables, fruits, and low-fat dairy products.  Choose whole grains. Look for the word "whole" as the first word in the ingredient list.  Choose fish and skinless chicken or [place] more often than red meat. Limit fish, poultry, and meat to 6 oz (170 g) each day.  Limit sweets, desserts, sugars, and sugary drinks.  Choose heart-healthy fats.  Limit cheese to 1 oz (28 g) per day.  Eat more home-cooked food and less  restaurant, buffet, and fast food.  Limit fried foods.  Cook foods using methods other than frying.  Limit canned vegetables. If you do use them, rinse them well to decrease the sodium.  When eating at a restaurant, ask that your food be prepared with less salt, or no salt if possible. WHAT FOODS CAN I EAT? Seek help from a dietitian for individual calorie needs. Grains Whole grain or whole wheat bread. Brown rice. Whole grain or whole wheat pasta. Quinoa, bulgur, and whole grain cereals. Low-sodium cereals. Corn or whole wheat flour tortillas. Whole grain cornbread. Whole grain crackers. Low-sodium crackers. Vegetables Fresh or frozen vegetables (raw, steamed, roasted, or grilled). Low-sodium or reduced-sodium tomato and vegetable juices. Low-sodium or reduced-sodium tomato sauce and paste. Low-sodium or reduced-sodium canned vegetables.  Fruits All fresh, canned (in natural juice), or frozen fruits. Meat and Other Protein Products Ground beef (85% or leaner), grass-fed beef, or beef trimmed of fat. Skinless chicken or [place]. Ground chicken or [place]. Pork trimmed of fat. All fish and seafood. Eggs. Dried beans, peas, or lentils. Unsalted nuts and seeds. Unsalted canned beans. Dairy Low-fat dairy products, such as skim or 1% milk, 2% or reduced-fat cheeses, low-fat ricotta or cottage cheese, or plain low-fat yogurt. Low-sodium or reduced-sodium cheeses. Fats and Oils Tub margarines without trans fats. Light or reduced-fat mayonnaise and salad dressings (reduced sodium). Avocado. Safflower, olive, or canola oils. Natural peanut or almond butter. Other Unsalted popcorn and  pretzels. The items listed above may not be a complete list of recommended foods or beverages. Contact your dietitian for more options. WHAT FOODS ARE NOT RECOMMENDED? Grains White bread. White pasta. White rice. Refined cornbread. Bagels and croissants. Crackers that contain trans fat. Vegetables Creamed or fried  vegetables. Vegetables in a cheese sauce. Regular canned vegetables. Regular canned tomato sauce and paste. Regular tomato and vegetable juices. Fruits Dried fruits. Canned fruit in light or heavy syrup. Fruit juice. Meat and Other Protein Products Fatty cuts of meat. Ribs, chicken wings, bacon, sausage, bologna, salami, chitterlings, fatback, hot dogs, bratwurst, and packaged luncheon meats. Salted nuts and seeds. Canned beans with salt. Dairy Whole or 2% milk, cream, half-and-half, and cream cheese. Whole-fat or sweetened yogurt. Full-fat cheeses or blue cheese. Nondairy creamers and whipped toppings. Processed cheese, cheese spreads, or cheese curds. Condiments Onion and garlic salt, seasoned salt, table salt, and sea salt. Canned and packaged gravies. Worcestershire sauce. Tartar sauce. Barbecue sauce. Teriyaki sauce. Soy sauce, including reduced sodium. Steak sauce. Fish sauce. Oyster sauce. Cocktail sauce. Horseradish. Ketchup and mustard. Meat flavorings and tenderizers. Bouillon cubes. Hot sauce. Tabasco sauce. Marinades. Taco seasonings. Relishes. Fats and Oils Butter, stick margarine, lard, shortening, ghee, and bacon fat. Coconut, palm kernel, or palm oils. Regular salad dressings. Other Pickles and olives. Salted popcorn and pretzels. The items listed above may not be a complete list of foods and beverages to avoid. Contact your dietitian for more information. WHERE CAN I FIND MORE INFORMATION? National Heart, Lung, and Blood Institute: travelstabloid.com Document Released: 08/19/2011 Document Revised: 01/14/2014 Document Reviewed: 07/04/2013 [facility] Patient Information 2015 [place], [place]. This information is not intended to replace advice given to you by your health care provider. Make sure you discuss any questions you have with your health care provider.

## 2014-06-05 NOTE — Assessment & Plan Note (Signed)
Poor BP control but refuses due to potential sexual side effects. Wants to focus on weight loss. Encouraged patient to work on this.

## 2014-06-05 NOTE — Assessment & Plan Note (Addendum)
A1c worsening with 7.1 today. Stressed by phone message the importance of weight loss and regular exercise. Followup 2 months. If continues to increase may need to add metformin 500 mg twice a day to get the Max dose

## 2014-06-05 NOTE — Progress Notes (Signed)
Mark Reddish, MD Phone: [phone]  Subjective:  Patient presents today to establish care with me as their new primary care provider. Patient was formerly a patient of Dr. Arnoldo Morale. Chief complaint-noted.    Morbid obesity No longer taking belviq and never took buproprion.  Treadmill-walks M-F 30-35 minutes Diet drinks-diet pepsi and water.  Weight loss classes in [place] ROS- admits to joint pain  Diabetes Does not regularly check blood sugars. Tolerating janumet. Advised aspirin. Patient refuses BP control or cholesterol control.  ROS-no hypoglycemia, no polyuria  Hyperlipidemia-poor control  Lab Results  Component Value Date   LDLCALC 149* 06/05/2014  On statin: no, refuses Regular exercise: states 5x a week Diet: poor choices, portion control ROS- no chest pain or shortness of breath. No myalgias  Hypertension-poor control diastolic  BP Readings from Last 3 Encounters:  06/05/14 130/98  09/26/13 140/84  05/22/13 144/90   Home BP monitoring-yes states usually in 120s over 80s Compliant with medications-no medications, refuses ROS-Denies any CP, HA, SOB, blurry vision  The following were reviewed and entered/updated in epic: Past Medical History  Diagnosis Date  . ALLERGIC RHINITIS   . Hyperlipidemia   . Diabetes mellitus    Patient Active Problem List   Diagnosis Date Noted  . Diabetes mellitus type II, controlled 06/05/2014    Priority: High  . Hypertension 06/05/2014    Priority: Medium  . Irritable bowel syndrome 03/30/2010    Priority: Medium  . HYPERLIPIDEMIA 05/12/2007    Priority: Medium  . Morbid obesity 12/29/2009    Priority: Low  . Dysmetabolic syndrome X 00/76/2263    Priority: Low  . BACK PAIN WITH RADICULOPATHY 06/09/2007    Priority: Low  . ALLERGIC RHINITIS 05/12/2007    Priority: Low   Past Surgical History  Procedure Laterality Date  . Tonsillectomy    . Cleft palate repair      Family History  Problem Relation Age of Onset    . AAA (abdominal aortic aneurysm) Father     smoker  . Lung cancer Mother     smoker    Medications- reviewed and updated Current Outpatient Prescriptions  Medication Sig Dispense Refill  . aspirin 81 MG tablet Take 81 mg by mouth daily.      . cetirizine (ZYRTEC) 10 MG tablet Take 10 mg by mouth 2 (two) times daily as needed.        . Choline Fenofibrate (FENOFIBRIC ACID) 135 MG CPDR TAKE 1 CAPSULE DAILY  90 capsule  2  . fish oil-omega-3 fatty acids 1000 MG capsule Take 1 g by mouth 3 (three) times daily.        [name] glucosamine-chondroitin 500-400 MG tablet Take 1 tablet by mouth daily.        [name] glucose blood (FREESTYLE TEST STRIPS) test strip 1 each by Other route daily. Use as instructed       . JANUMET 50-500 MG per tablet TAKE 1 TABLET TWICE A DAY  180 tablet  3  . Lancets (FREESTYLE) lancets 1 each by Other route as directed. Use as instructed       . Multiple Vitamin (MULTIVITAMIN) capsule Take 1 capsule by mouth daily.        . fluticasone (FLONASE) 50 MCG/ACT nasal spray 1 spray by Nasal route as needed.         No current facility-administered medications for this visit.    Allergies-reviewed and updated No Known Allergies  History   Social History  . Marital Status: Married  Spouse Name: N/A    Number of Children: N/A  . Years of Education: N/A   Social History Main Topics  . Smoking status: Never Smoker   . Smokeless tobacco: None  . Alcohol Use: Yes     Comment: occasional  . Drug Use: No  . Sexual Activity: Yes   Other Topics Concern  . None   Social History Narrative   Married 1985. No kids. 4 small dogs.       Works in Financial trader, residential      Hobbies: work on cars, Haematologist, exercise as able    ROS--See HPI   Objective: BP 130/98  Pulse 80  Temp(Src) 98.3 F (36.8 C)  Wt 277 lb (125.646 kg) Gen: NAD, resting comfortably, morbidly obese HEENT: Mucous membranes are moist. Oropharynx normal Neck: no thyromegaly CV: RRR  no murmurs rubs or gallops Lungs: CTAB no crackles, wheeze, rhonchi Abdomen: soft/nontender/nondistended/normal bowel sounds. Somewhat distant Ext: trace edema, 2+ PT pulses Skin: warm, dry, no rash  Neuro: grossly normal, moves all extremities, PERRLA  Assessment/Plan:  Morbid obesity Most medical problems probably secondary to obesity. Trial of the belviq did not produce weight loss. Patient claims to be exercising 30-35 minutes 5 times a week so I encouraged him to increase this to 45 minutes. I encouraged water only but he enjoys diet sodas. I encouraged diabetic nutrition for nutrition consult but patient wants to try weight loss clinic first in [place]. Discussed gastric bypass the patient not interested. Followup in 2 months at physical  Diabetes mellitus type II, controlled A1c worsening with 7.1 today. Stressed by phone message the importance of weight loss and regular exercise. Followup 2 months. If continues to increase may need to add metformin 500 mg twice a day to get the Max dose  Hypertension Poor BP control but refuses due to potential sexual side effects. Wants to focus on weight loss. Encouraged patient to work on this.   HYPERLIPIDEMIA Refuses statin. LDL elevated and at risk for cardiac concerns due to diabetes. Counseled patient in depth.    Fasting labs Orders Placed This Encounter  Procedures  . CBC    [place]  . Comprehensive metabolic panel    [place]  . Lipid panel    [place]  . Hemoglobin A1c    [place]  . TSH    [place]  . PSA  . Microalbumin / creatinine urine ratio

## 2014-06-05 NOTE — Assessment & Plan Note (Signed)
Most medical problems probably secondary to obesity. Trial of the belviq did not produce weight loss. Patient claims to be exercising 30-35 minutes 5 times a week so I encouraged him to increase this to 45 minutes. I encouraged water only but he enjoys diet sodas. I encouraged diabetic nutrition for nutrition consult but patient wants to try weight loss clinic first in [place]. Discussed gastric bypass the patient not interested. Followup in 2 months at physical

## 2014-06-05 NOTE — Assessment & Plan Note (Signed)
Refuses statin. LDL elevated and at risk for cardiac concerns due to diabetes. Counseled patient in depth.

## 2014-06-21 ENCOUNTER — Telehealth: Payer: Self-pay | Admitting: Family Medicine

## 2014-06-21 MED ORDER — SITAGLIPTIN PHOS-METFORMIN HCL 50-500 MG PO TABS
1.0000 | ORAL_TABLET | Freq: Two times a day (BID) | ORAL | Status: DC
Start: 2014-06-21 — End: 2014-12-18

## 2014-06-21 NOTE — Telephone Encounter (Signed)
Medication refilled

## 2014-06-21 NOTE — Telephone Encounter (Signed)
[facility] requesting refill of JANUMET 50-500 MG per tablet #180 w/3 refills.

## 2014-06-25 ENCOUNTER — Ambulatory Visit (INDEPENDENT_AMBULATORY_CARE_PROVIDER_SITE_OTHER): Payer: BC Managed Care – PPO | Admitting: Family Medicine

## 2014-06-25 ENCOUNTER — Encounter: Payer: Self-pay | Admitting: Family Medicine

## 2014-06-25 VITALS — BP 138/88 | Temp 98.3°F | Wt 278.0 lb

## 2014-06-25 DIAGNOSIS — E785 Hyperlipidemia, unspecified: Secondary | ICD-10-CM

## 2014-06-25 DIAGNOSIS — I10 Essential (primary) hypertension: Secondary | ICD-10-CM

## 2014-06-25 DIAGNOSIS — E119 Type 2 diabetes mellitus without complications: Secondary | ICD-10-CM

## 2014-06-25 NOTE — Assessment & Plan Note (Signed)
1 pound weight gain. Reports improved exercise and diet, hope this is reflected by next weight

## 2014-06-25 NOTE — Assessment & Plan Note (Signed)
Improved control today with increasing exercise. Encouraged patient to continue. Will need long-term weight loss to prevent need for blood pressure medicine. Patient states he would refuse medicine anyway

## 2014-06-25 NOTE — Patient Instructions (Addendum)
Let's see each other back late January or Early February for an a1c check. Actually come in 1 week before for an a1c check and then we can discuss at visit.   Keep up the great job eating better and with the increased exercise. I am hopeful we will see the positive benefits in 3 months.   Continue the 45 minutes of exercise per night.   Remember second helpings should be fruits/veggies only.   Health Maintenance Due  Topic Date Due  . Pneumococcal Polysaccharide Vaccine (##1) -declined 07[birth year]15/1958  . Colonoscopy - declined 07/15/2006  . Tetanus/tdap -declined  09/17/2008  . Foot Exam - looked great  08/23/2011  . Ophthalmology Exam - [facility]-schedule appointment and have them send [place] a records 08/23/2011  . Influenza Vaccine -declined 04/13/2014

## 2014-06-25 NOTE — Progress Notes (Signed)
Mark Reddish, MD Phone: [phone]  Subjective:  Patient presents today to follow up on the following. Chief complaint-noted.   Morbid obesity-poor control Up 1 lb  Treadmill-walks M-F 45 minutes Diet drinks-diet pepsi and water.  Weight loss classes in [place]-decided against and started to eat better instead.  ROS- admits to joint pain  Diabetes-mild poor control Lab Results  Component Value Date   HGBA1C 7.1* 06/05/2014  Tolerating janumet. Advised aspirin. Refuses most health maintenance except for foot exam.   ROS-no hypoglycemia, no polyuria  Hyperlipidemia-poor control  Lab Results  Component Value Date   LDLCALC 149* 06/05/2014  On statin: no, refuses again today Regular exercise: states 5x a week 45 minutes walking Diet: working on portion control, avoiding cheeseburgers/fatty foot ROS- no chest pain or shortness of breath. No myalgias  Hypertension-improved control  BP Readings from Last 3 Encounters:  06/25/14 138/88  06/05/14 130/98  09/26/13 140/84  Home BP monitoring-yes states usually in 120s over 80s still Compliant with medications-no medications, refuses ROS-Denies any CP, HA, SOB, blurry vision  The following were reviewed and entered/updated in epic: Past Medical History  Diagnosis Date  . ALLERGIC RHINITIS   . Hyperlipidemia   . Diabetes mellitus    Patient Active Problem List   Diagnosis Date Noted  . Diabetes mellitus type II, controlled 06/05/2014    Priority: High  . Hypertension 06/05/2014    Priority: Medium  . Irritable bowel syndrome 03/30/2010    Priority: Medium  . Hyperlipidemia 05/12/2007    Priority: Medium  . Morbid obesity 12/29/2009    Priority: Low  . Dysmetabolic syndrome X 83/38/2505    Priority: Low  . BACK PAIN WITH RADICULOPATHY 06/09/2007    Priority: Low  . ALLERGIC RHINITIS 05/12/2007    Priority: Low   Past Surgical History  Procedure Laterality Date  . Tonsillectomy    . Cleft palate repair    .  Deviated septum repair      slight improvement    Family History  Problem Relation Age of Onset  . AAA (abdominal aortic aneurysm) Father     smoker  . Lung cancer Mother     smoker  . Brain cancer Mother     metastasis    Medications- reviewed and updated Current Outpatient Prescriptions  Medication Sig Dispense Refill  . aspirin 81 MG tablet Take 81 mg by mouth daily.      . cetirizine (ZYRTEC) 10 MG tablet Take 10 mg by mouth 2 (two) times daily as needed.        . Choline Fenofibrate (FENOFIBRIC ACID) 135 MG CPDR TAKE 1 CAPSULE DAILY  90 capsule  2  . fish oil-omega-3 fatty acids 1000 MG capsule Take 1 g by mouth 3 (three) times daily.        . fluticasone (FLONASE) 50 MCG/ACT nasal spray 1 spray by Nasal route as needed.        [name] glucosamine-chondroitin 500-400 MG tablet Take 1 tablet by mouth daily.        [name] glucose blood (FREESTYLE TEST STRIPS) test strip 1 each by Other route daily. Use as instructed       . Lancets (FREESTYLE) lancets 1 each by Other route as directed. Use as instructed       . Multiple Vitamin (MULTIVITAMIN) capsule Take 1 capsule by mouth daily.        . sitaGLIPtin-metformin (JANUMET) 50-500 MG per tablet Take 1 tablet by mouth 2 (two) times daily with a meal.  180 tablet  1   No current facility-administered medications for this visit.    Allergies-reviewed and updated No Known Allergies  History   Social History  . Marital Status: Married    Spouse Name: N/A    Number of Children: N/A  . Years of Education: N/A   Social History Main Topics  . Smoking status: Never Smoker   . Smokeless tobacco: None  . Alcohol Use: Yes     Comment: occasional  . Drug Use: No  . Sexual Activity: Yes   Other Topics Concern  . None   Social History Narrative   Married 1985. No kids. 4 small dogs.       Works in Financial trader, residential      Hobbies: work on cars, Haematologist, exercise as able    ROS--See HPI   Objective: BP 138/88   Temp(Src) 98.3 F (36.8 C)  Wt 278 lb (126.1 kg) Gen: NAD, resting comfortably in chair CV: RRR no murmurs rubs or gallops Lungs: CTAB no crackles, wheeze, rhonchi Abdomen: soft/nontender/nondistended/normal bowel sounds.  Ext: no edema Skin: warm, dry, 2+ PT and DP pulses Neuro: grossly normal, moves all extremities  DM foot exam normal   Assessment/Plan:  Diabetes mellitus type II, controlled Last A1c 7.1, mild poor control. Foot exam today. 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 I counseled him on need for Pneumovax and flu vaccine but patient refused. Patient reports increased exercise 45 minutes walking 5 times a week. Praised this. Hopeful to see weight and A1c come down at next visit. Follow up 3 months  Hypertension Improved control today with increasing exercise. Encouraged patient to continue. Will need long-term weight loss to prevent need for blood pressure medicine. Patient states he would refuse medicine anyway  Hyperlipidemia Counseled on need for statin to lower cardiac risk. Patient not interested and refuses.  Morbid obesity 1 pound weight gain. Reports improved exercise and diet, hope this is reflected by next weight   Patient refuses all immunizations and colonoscopy. Agree to Hemoccult during rectal exam, plan for the next visit. Does agree to go see his ophthalmologist.

## 2014-06-25 NOTE — Assessment & Plan Note (Signed)
Counseled on need for statin to lower cardiac risk. Patient not interested and refuses.

## 2014-06-25 NOTE — Assessment & Plan Note (Addendum)
Last A1c 7.1, mild poor control. Foot exam today. 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 I counseled him on need for Pneumovax and flu vaccine but patient refused. Patient reports increased exercise 45 minutes walking 5 times a week. Praised this. Hopeful to see weight and A1c come down at next visit. Follow up 3 months

## 2014-10-02 ENCOUNTER — Other Ambulatory Visit: Payer: Self-pay | Admitting: *Deleted

## 2014-10-02 MED ORDER — FENOFIBRIC ACID 135 MG PO CPDR: 1.0000 | DELAYED_RELEASE_CAPSULE | Freq: Every day | ORAL | Status: DC

## 2014-10-15 ENCOUNTER — Other Ambulatory Visit: Payer: BC Managed Care – PPO

## 2014-10-16 ENCOUNTER — Other Ambulatory Visit (INDEPENDENT_AMBULATORY_CARE_PROVIDER_SITE_OTHER): Payer: BLUE CROSS/BLUE SHIELD

## 2014-10-16 DIAGNOSIS — E119 Type 2 diabetes mellitus without complications: Secondary | ICD-10-CM

## 2014-10-16 LAB — HEMOGLOBIN A1C: HEMOGLOBIN A1C: 7.6 % — AB (ref 4.6–6.5)

## 2014-10-22 ENCOUNTER — Ambulatory Visit (INDEPENDENT_AMBULATORY_CARE_PROVIDER_SITE_OTHER): Payer: BLUE CROSS/BLUE SHIELD | Admitting: Family Medicine

## 2014-10-22 ENCOUNTER — Encounter: Payer: Self-pay | Admitting: Family Medicine

## 2014-10-22 VITALS — BP 130/72 | Temp 98.1°F | Wt 282.0 lb

## 2014-10-22 DIAGNOSIS — IMO0002 Reserved for concepts with insufficient information to code with codable children: Secondary | ICD-10-CM

## 2014-10-22 DIAGNOSIS — E1165 Type 2 diabetes mellitus with hyperglycemia: Secondary | ICD-10-CM

## 2014-10-22 DIAGNOSIS — E785 Hyperlipidemia, unspecified: Secondary | ICD-10-CM

## 2014-10-22 NOTE — Progress Notes (Signed)
Garret Reddish, MD Phone: [phone]  Subjective:   Mark Frey is a 59 y.o. year old very pleasant male patient who presents with the following:  Diabetes, poor control on janumet 50-500 BID a1c 7.6 from 7.1 cbg 100-108 last time checked Had been eating better and increased exercise 45 mins nightly previously. Weight up 4 lbs and 12 lbs in a year. Patient admits due to exercising he liberalized his diet.   ROS- no hypoglycemia, no blurry vision, no increased nocturia  Hyperlipidemia-poor control, continues to refuse statin  Lab Results  Component Value Date   LDLCALC 149* 06/05/2014  Regular exercise: yes 45 minutes 5x a week Diet: many poor choices ROS- no chest pain or shortness of breath. No myalgias  Past Medical History- Patient Active Problem List   Diagnosis Date Noted  . Diabetes mellitus type II, uncontrolled 06/05/2014    Priority: High  . Hypertension 06/05/2014    Priority: Medium  . Irritable bowel syndrome 03/30/2010    Priority: Medium  . Hyperlipidemia 05/12/2007    Priority: Medium  . Morbid obesity 12/29/2009    Priority: Low  . Dysmetabolic syndrome X 54/62/7035    Priority: Low  . BACK PAIN WITH RADICULOPATHY 06/09/2007    Priority: Low  . ALLERGIC RHINITIS 05/12/2007    Priority: Low   Medications- reviewed and updated Current Outpatient Prescriptions  Medication Sig Dispense Refill  . aspirin 81 MG tablet Take 81 mg by mouth daily.    . cetirizine (ZYRTEC) 10 MG tablet Take 10 mg by mouth 2 (two) times daily as needed.      . Choline Fenofibrate (FENOFIBRIC ACID) 135 MG CPDR Take 1 capsule by mouth daily. 90 capsule 2  . fish oil-omega-3 fatty acids 1000 MG capsule Take 1 g by mouth 3 (three) times daily.      [name] glucosamine-chondroitin 500-400 MG tablet Take 1 tablet by mouth daily.      [name] glucose blood (FREESTYLE TEST STRIPS) test strip 1 each by Other route daily. Use as instructed     . Lancets (FREESTYLE) lancets 1 each by Other  route as directed. Use as instructed     . Multiple Vitamin (MULTIVITAMIN) capsule Take 1 capsule by mouth daily.      . sitaGLIPtin-metformin (JANUMET) 50-500 MG per tablet Take 1 tablet by mouth 2 (two) times daily with a meal. 180 tablet 1  . fluticasone (FLONASE) 50 MCG/ACT nasal spray 1 spray by Nasal route as needed.       No current facility-administered medications for this visit.    Objective: BP 130/72 mmHg  Temp(Src) 98.1 F (36.7 C)  Wt 282 lb (127.914 kg) Gen: NAD, resting comfortably, obese CV: RRR no murmurs rubs or gallops Lungs: CTAB no crackles, wheeze, rhonchi Abdomen: soft/nontender/nondistended/normal bowel sounds. No rebound or guarding. Obese Ext: no edema Skin: warm, dry, no rash   Assessment/Plan:  Diabetes mellitus type II, uncontrolled Poor control of a1c with goal <7. Weight up 12 lbs over year. Exercising but poor eating habits. Discussed increasing janumet dosage but patient would prefer to work on weight loss first before increasing medication. Follow up 4 months. Goal weight loss at least 10 lbs.    Hyperlipidemia poor control, continues to refuse statin. Advised of need to reduce cardiac risk.     Return precautions advised. Consider repeat LFTs at follow up with likely fatty liver.   Before next visit Orders Placed This Encounter  Procedures  . Hemoglobin A1c    [facility]  Standing Status: Future     Number of Occurrences:      Standing Expiration Date: 10/23/2015

## 2014-10-22 NOTE — Patient Instructions (Addendum)
A1c up to 7.6 from 7.1 Goal less than 7 Great job on exercise Now you need to add the healthy eating portion back Come in 4 months with a1c a few days before so we can discuss.  Weight up 12 lbs in a year. I am hopefuly you can get back to 270 by next visit.   Health Maintenance Due  Topic Date Due  . OPHTHALMOLOGY EXAM  08/23/2011

## 2014-10-22 NOTE — Assessment & Plan Note (Signed)
poor control, continues to refuse statin. Advised of need to reduce cardiac risk.

## 2014-10-22 NOTE — Assessment & Plan Note (Signed)
Poor control of a1c with goal <7. Weight up 12 lbs over year. Exercising but poor eating habits. Discussed increasing janumet dosage but patient would prefer to work on weight loss first before increasing medication. Follow up 4 months. Goal weight loss at least 10 lbs.

## 2014-12-18 ENCOUNTER — Other Ambulatory Visit: Payer: Self-pay | Admitting: Family Medicine

## 2015-02-12 ENCOUNTER — Other Ambulatory Visit (INDEPENDENT_AMBULATORY_CARE_PROVIDER_SITE_OTHER): Payer: BLUE CROSS/BLUE SHIELD

## 2015-02-12 DIAGNOSIS — IMO0002 Reserved for concepts with insufficient information to code with codable children: Secondary | ICD-10-CM

## 2015-02-12 DIAGNOSIS — E1165 Type 2 diabetes mellitus with hyperglycemia: Secondary | ICD-10-CM | POA: Diagnosis not present

## 2015-02-12 LAB — HEMOGLOBIN A1C: Hgb A1c MFr Bld: 6.5 % (ref 4.6–6.5)

## 2015-02-18 ENCOUNTER — Ambulatory Visit (INDEPENDENT_AMBULATORY_CARE_PROVIDER_SITE_OTHER): Payer: BLUE CROSS/BLUE SHIELD | Admitting: Family Medicine

## 2015-02-18 ENCOUNTER — Encounter: Payer: Self-pay | Admitting: Family Medicine

## 2015-02-18 VITALS — BP 120/74 | HR 97 | Temp 98.3°F | Wt 272.0 lb

## 2015-02-18 DIAGNOSIS — E785 Hyperlipidemia, unspecified: Secondary | ICD-10-CM

## 2015-02-18 DIAGNOSIS — E1165 Type 2 diabetes mellitus with hyperglycemia: Secondary | ICD-10-CM | POA: Diagnosis not present

## 2015-02-18 DIAGNOSIS — IMO0002 Reserved for concepts with insufficient information to code with codable children: Secondary | ICD-10-CM

## 2015-02-18 DIAGNOSIS — I10 Essential (primary) hypertension: Secondary | ICD-10-CM | POA: Diagnosis not present

## 2015-02-18 NOTE — Progress Notes (Signed)
Garret Reddish, MD  Subjective:  Mark Frey is a 59 y.o. year old very pleasant male patient who presents with:  DIABETES Type II-controlled with janumet alone  Lab Results  Component Value Date   HGBA1C 6.5 02/12/2015   HGBA1C 7.6* 10/16/2014   HGBA1C 7.1* 06/05/2014   Medications taking and tolerating-yes Blood Sugars per patient-fasting-rarely checks Diet-watching intake portoin size Regular Exercise-45 minutes on treadmill 5 days a week  ROS- Denies  Vision changes, feet or hand numbness/pain/tingling. Denies  Hypoglycemia symptoms (shaky, sweaty, hungry, weak anxious, tremor, palpitations, confusion, behavior change).   Hypertension-controlled without meds with weight loss  BP Readings from Last 3 Encounters:  02/18/15 120/74  10/22/14 130/72  06/25/14 138/88   Home BP monitoring-no Compliant with medications-none ROS-Denies any CP, HA, SOB, blurry vision, LE edema  Hyperlipidemia-poor control  Lab Results  Component Value Date   LDLCALC 149* 06/05/2014   On statin: refuses Regular exercise: yes as above ROS- no chest pain or shortness of breath. No myalgias  Past Medical History- IBS, morbid obesity, allergic rhinitis  Medications- reviewed and updated Current Outpatient Prescriptions  Medication Sig Dispense Refill  . aspirin 81 MG tablet Take 81 mg by mouth daily.    . cetirizine (ZYRTEC) 10 MG tablet Take 10 mg by mouth 2 (two) times daily as needed.      . Choline Fenofibrate (FENOFIBRIC ACID) 135 MG CPDR Take 1 capsule by mouth daily. 90 capsule 2  . fish oil-omega-3 fatty acids 1000 MG capsule Take 1 g by mouth 3 (three) times daily.      . fluticasone (FLONASE) 50 MCG/ACT nasal spray 1 spray by Nasal route as needed.      [name] glucosamine-chondroitin 500-400 MG tablet Take 1 tablet by mouth daily.      [name] glucose blood (FREESTYLE TEST STRIPS) test strip 1 each by Other route daily. Use as instructed     . JANUMET 50-500 MG per tablet TAKE 1 TABLET TWICE A  DAY WITH MEALS 180 tablet 0  . Lancets (FREESTYLE) lancets 1 each by Other route as directed. Use as instructed     . Multiple Vitamin (MULTIVITAMIN) capsule Take 1 capsule by mouth daily.       No current facility-administered medications for this visit.    Objective: BP 120/74 mmHg  Pulse 97  Temp(Src) 98.3 F (36.8 C)  Wt 272 lb (123.378 kg) Gen: NAD, resting comfortably in chair CV: RRR no murmurs rubs or gallops Lungs: CTAB no crackles, wheeze, rhonchi Abdomen: soft/nontender/nondistended/normal bowel sounds. No rebound or guarding. Morbidly obese Ext: no edema Skin: warm, dry, no rash  Neuro: grossly normal, moves all extremities  Assessment/Plan:  Diabetes mellitus type II, uncontrolled a1c down 1.1 points to 6.5 with 10 lbs weight loss and continued janumet 50-500 BID. Praised efforts with goal another 10 lbs down in 6 months.    Hyperlipidemia Continue fish oil and fenofibric acid. Triglycerides are reasonable but LDL still high. Wants to work on continued weight loss and refuses statin.    Hypertension No rx. Continued improved control with weight loss. Continue to follow closely. Advise dash type diet and regular exercise to be continued.    Morbid obesity S: 10 lbs down but still far from ideal weight. walkign 45 mins 5x a week.  A/P: continue weight loss efforts- diet/exercise    6 months for CPE, labs week prior

## 2015-02-18 NOTE — Assessment & Plan Note (Signed)
No rx. Continued improved control with weight loss. Continue to follow closely. Advise dash type diet and regular exercise to be continued.

## 2015-02-18 NOTE — Assessment & Plan Note (Signed)
a1c down 1.1 points to 6.5 with 10 lbs weight loss and continued janumet 50-500 BID. Praised efforts with goal another 10 lbs down in 6 months.

## 2015-02-18 NOTE — Assessment & Plan Note (Signed)
S: 10 lbs down but still far from ideal weight. walkign 45 mins 5x a week.  A/P: continue weight loss efforts- diet/exercise

## 2015-02-18 NOTE — Assessment & Plan Note (Signed)
Continue fish oil and fenofibric acid. Triglycerides are reasonable but LDL still high. Wants to work on continued weight loss and refuses statin.

## 2015-02-18 NOTE — Patient Instructions (Addendum)
BP controlled today- looks even better than last time  Weight down 10 lbs to 272. I would love to see you at 262 by 6 month follow up  Diabetes much improved control with a1c 6.5 down from 7.6  Do annual physical in 6 months with labs a week before

## 2015-03-17 ENCOUNTER — Other Ambulatory Visit: Payer: Self-pay | Admitting: Family Medicine

## 2015-03-24 LAB — HM DIABETES EYE EXAM

## 2015-06-06 ENCOUNTER — Other Ambulatory Visit: Payer: Self-pay | Admitting: Family Medicine

## 2015-08-12 ENCOUNTER — Other Ambulatory Visit: Payer: BLUE CROSS/BLUE SHIELD

## 2015-08-19 ENCOUNTER — Encounter: Payer: BLUE CROSS/BLUE SHIELD | Admitting: Family Medicine

## 2015-10-21 ENCOUNTER — Other Ambulatory Visit (INDEPENDENT_AMBULATORY_CARE_PROVIDER_SITE_OTHER): Payer: BLUE CROSS/BLUE SHIELD

## 2015-10-21 DIAGNOSIS — Z0000 Encounter for general adult medical examination without abnormal findings: Secondary | ICD-10-CM

## 2015-10-21 DIAGNOSIS — R7989 Other specified abnormal findings of blood chemistry: Secondary | ICD-10-CM | POA: Diagnosis not present

## 2015-10-21 LAB — HEPATIC FUNCTION PANEL
ALBUMIN: 4.6 g/dL (ref 3.5–5.2)
ALT: 44 U/L (ref 0–53)
AST: 25 U/L (ref 0–37)
Alkaline Phosphatase: 71 U/L (ref 39–117)
Bilirubin, Direct: 0.1 mg/dL (ref 0.0–0.3)
Total Bilirubin: 0.6 mg/dL (ref 0.2–1.2)
Total Protein: 7.6 g/dL (ref 6.0–8.3)

## 2015-10-21 LAB — POC URINALSYSI DIPSTICK (AUTOMATED)
Bilirubin, UA: NEGATIVE
Blood, UA: NEGATIVE
Ketones, UA: NEGATIVE
LEUKOCYTES UA: NEGATIVE
NITRITE UA: NEGATIVE
PH UA: 5.5
Spec Grav, UA: 1.02
UROBILINOGEN UA: 0.2

## 2015-10-21 LAB — CBC WITH DIFFERENTIAL/PLATELET
BASOS PCT: 0.6 % (ref 0.0–3.0)
Basophils Absolute: 0 10*3/uL (ref 0.0–0.1)
EOS PCT: 1.1 % (ref 0.0–5.0)
Eosinophils Absolute: 0.1 10*3/uL (ref 0.0–0.7)
HEMATOCRIT: 46.4 % (ref 39.0–52.0)
HEMOGLOBIN: 15.6 g/dL (ref 13.0–17.0)
LYMPHS PCT: 44.2 % (ref 12.0–46.0)
Lymphs Abs: 2.7 10*3/uL (ref 0.7–4.0)
MCHC: 33.6 g/dL (ref 30.0–36.0)
MCV: 93 fl (ref 78.0–100.0)
MONO ABS: 0.5 10*3/uL (ref 0.1–1.0)
MONOS PCT: 8.3 % (ref 3.0–12.0)
Neutro Abs: 2.8 10*3/uL (ref 1.4–7.7)
Neutrophils Relative %: 45.8 % (ref 43.0–77.0)
Platelets: 227 10*3/uL (ref 150.0–400.0)
RBC: 4.99 Mil/uL (ref 4.22–5.81)
RDW: 13.6 % (ref 11.5–15.5)
WBC: 6.2 10*3/uL (ref 4.0–10.5)

## 2015-10-21 LAB — MICROALBUMIN / CREATININE URINE RATIO
CREATININE, U: 118 mg/dL
MICROALB/CREAT RATIO: 26.6 mg/g (ref 0.0–30.0)
Microalb, Ur: 31.4 mg/dL — ABNORMAL HIGH (ref 0.0–1.9)

## 2015-10-21 LAB — BASIC METABOLIC PANEL
BUN: 18 mg/dL (ref 6–23)
CHLORIDE: 99 meq/L (ref 96–112)
CO2: 31 mEq/L (ref 19–32)
Calcium: 10.4 mg/dL (ref 8.4–10.5)
Creatinine, Ser: 1.07 mg/dL (ref 0.40–1.50)
GFR: 75.11 mL/min (ref >60.00)
Glucose, Bld: 337 mg/dL — ABNORMAL HIGH (ref 70–99)
POTASSIUM: 5 meq/L (ref 3.5–5.1)
SODIUM: 137 meq/L (ref 135–145)

## 2015-10-21 LAB — PSA: PSA: 0.72 ng/mL (ref 0.10–4.00)

## 2015-10-21 LAB — HEMOGLOBIN A1C: HEMOGLOBIN A1C: 11.3 % — AB (ref 4.6–6.5)

## 2015-10-21 LAB — LIPID PANEL
CHOL/HDL RATIO: 7
Cholesterol: 208 mg/dL — ABNORMAL HIGH (ref 0–200)
HDL: 27.8 mg/dL — AB (ref >39.00)
NonHDL: 180.21
Triglycerides: 294 mg/dL — ABNORMAL HIGH (ref 0.0–149.0)
VLDL: 58.8 mg/dL — AB (ref 0.0–40.0)

## 2015-10-21 LAB — TSH: TSH: 0.91 u[IU]/mL (ref 0.35–4.50)

## 2015-10-21 LAB — LDL CHOLESTEROL, DIRECT: LDL DIRECT: 121 mg/dL

## 2015-10-28 ENCOUNTER — Encounter: Payer: Self-pay | Admitting: Family Medicine

## 2015-10-28 ENCOUNTER — Ambulatory Visit (INDEPENDENT_AMBULATORY_CARE_PROVIDER_SITE_OTHER): Payer: BLUE CROSS/BLUE SHIELD | Admitting: Family Medicine

## 2015-10-28 VITALS — BP 148/72 | HR 115 | Temp 98.8°F | Ht 69.0 in | Wt 266.0 lb

## 2015-10-28 DIAGNOSIS — Z0001 Encounter for general adult medical examination with abnormal findings: Secondary | ICD-10-CM

## 2015-10-28 DIAGNOSIS — E785 Hyperlipidemia, unspecified: Secondary | ICD-10-CM

## 2015-10-28 DIAGNOSIS — E1165 Type 2 diabetes mellitus with hyperglycemia: Secondary | ICD-10-CM

## 2015-10-28 DIAGNOSIS — I10 Essential (primary) hypertension: Secondary | ICD-10-CM | POA: Diagnosis not present

## 2015-10-28 DIAGNOSIS — Z1211 Encounter for screening for malignant neoplasm of colon: Secondary | ICD-10-CM

## 2015-10-28 DIAGNOSIS — IMO0001 Reserved for inherently not codable concepts without codable children: Secondary | ICD-10-CM

## 2015-10-28 MED ORDER — SITAGLIPTIN PHOS-METFORMIN HCL 50-1000 MG PO TABS: 1.0000 | ORAL_TABLET | Freq: Two times a day (BID) | ORAL | Status: DC

## 2015-10-28 MED ORDER — GLIMEPIRIDE 4 MG PO TABS: 4.0000 mg | ORAL_TABLET | Freq: Every day | ORAL | Status: DC

## 2015-10-28 NOTE — Progress Notes (Signed)
Garret Reddish, MD Phone: [phone]  Subjective:  Patient presents today for their annual physical. Chief complaint-noted.   See problem oriented charting- ROS- full  review of systems was completed and negative except for: as noted in diabetes comments- thirst, polyuria now improving.   The following were reviewed and entered/updated in epic: Past Medical History  Diagnosis Date  . ALLERGIC RHINITIS   . Hyperlipidemia   . Diabetes mellitus    Patient Active Problem List   Diagnosis Date Noted  . Diabetes mellitus type II, uncontrolled ([place]) 06/05/2014    Priority: High  . Hypertension 06/05/2014    Priority: Medium  . Irritable bowel syndrome 03/30/2010    Priority: Medium  . Hyperlipidemia 05/12/2007    Priority: Medium  . Morbid obesity ([place]) 12/29/2009    Priority: Low  . Dysmetabolic syndrome X 92/07/9416    Priority: Low  . BACK PAIN WITH RADICULOPATHY 06/09/2007    Priority: Low  . ALLERGIC RHINITIS 05/12/2007    Priority: Low   Past Surgical History  Procedure Laterality Date  . Tonsillectomy    . Cleft palate repair    . Deviated septum repair      slight improvement    Family History  Problem Relation Age of Onset  . AAA (abdominal aortic aneurysm) Father     smoker  . Lung cancer Mother     smoker  . Brain cancer Mother     metastasis    Medications- reviewed and updated Current Outpatient Prescriptions  Medication Sig Dispense Refill  . aspirin 81 MG tablet Take 81 mg by mouth daily.    . cetirizine (ZYRTEC) 10 MG tablet Take 10 mg by mouth 2 (two) times daily as needed.      . Choline Fenofibrate (FENOFIBRIC ACID) 135 MG CPDR TAKE 1 CAPSULE DAILY 90 capsule 2  . fish oil-omega-3 fatty acids 1000 MG capsule Take 1 g by mouth 3 (three) times daily.      . fluticasone (FLONASE) 50 MCG/ACT nasal spray 1 spray by Nasal route as needed.      [name] glucosamine-chondroitin 500-400 MG tablet Take 1 tablet by mouth daily.      [name] glucose blood  (FREESTYLE TEST STRIPS) test strip 1 each by Other route daily. Use as instructed     . Lancets (FREESTYLE) lancets 1 each by Other route as directed. Use as instructed     . Multiple Vitamin (MULTIVITAMIN) capsule Take 1 capsule by mouth daily.      [name] glimepiride (AMARYL) 4 MG tablet Take 1 tablet (4 mg total) by mouth daily before breakfast. 90 tablet 3  . sitaGLIPtin-metformin (JANUMET) 50-1000 MG tablet Take 1 tablet by mouth 2 (two) times daily with a meal. 180 tablet 3   No current facility-administered medications for this visit.    Allergies-reviewed and updated No Known Allergies  Social History   Social History  . Marital Status: Married    Spouse Name: N/A  . Number of Children: N/A  . Years of Education: N/A   Social History Main Topics  . Smoking status: Never Smoker   . Smokeless tobacco: None  . Alcohol Use: Yes     Comment: occasional  . Drug Use: No  . Sexual Activity: Yes   Other Topics Concern  . None   Social History Narrative   Married 1985. No kids. 4 small dogs.       Works in Financial trader, residential      Hobbies: work on cars,  yardwork, exercise as able    ROS--See HPI   Objective: BP 148/72 mmHg  Pulse 115  Temp(Src) 98.8 F (37.1 C)  Ht 5' 9"  (1.753 m)  Wt 266 lb (120.657 kg)  BMI 39.26 kg/m2 Gen: NAD, resting comfortably HEENT: Mucous membranes are moist. Oropharynx normal Neck: no thyromegaly CV: RRR no murmurs rubs or gallops Lungs: CTAB no crackles, wheeze, rhonchi Abdomen: soft/nontender/nondistended/normal bowel sounds. No rebound or guarding.  Rectal: normal tone, normal prostate, no masses or tenderness Ext: no edema Skin: warm, dry Neuro: grossly normal, moves all extremities, PERRLA  Diabetic Foot Exam - Simple   Simple Foot Form  Diabetic Foot exam was performed with the following findings:  Yes 10/28/2015  2:31 PM  Visual Inspection  No deformities, no ulcerations, no other skin breakdown bilaterally:   Yes  Sensation Testing  Intact to touch and monofilament testing bilaterally:  Yes  Pulse Check  Posterior Tibialis and Dorsalis pulse intact bilaterally:  Yes  Comments     Assessment/Plan:  60 y.o. male presenting for annual physical.  Health Maintenance counseling: 1. Anticipatory guidance: Patient counseled regarding regular dental exams, eye exams, wearing seatbelts.  2. Risk factor reduction:  Advised patient of need for regular exercise and diet rich and fruits and vegetables to reduce risk of heart attack and stroke.  3. Immunizations/screenings/ancillary studies Health Maintenance Due  Topic Date Due  . Hepatitis C Screening - declined 1955/11/21  . HIV Screening -declined 07/16/1971  . OPHTHALMOLOGY EXAM - completed, to sign ROI 08/23/2011  . FOOT EXAM -normal today 06/26/2015   4. Prostate cancer screening- low risk based off PSA and rectal  Lab Results  Component Value Date   PSA 0.72 10/21/2015   PSA 0.94 06/05/2014   PSA 0.80 05/02/2013   5. Colon cancer screening - opts for stool cards, declines colonoscopy   Diabetes mellitus type II, uncontrolled ([place]) S: very poorly controlled due to extreme noncompliance with healthy eating despite being On janumet 50-500 BID CBGs- Was getting around 400 when first started checking, now down to around 220. Watching what he is eating, smaller portions, no white breads. Had been drinking a lot of OJ and has cut this out. Thirsty, peeing a lot, dry mouth.  Exercise and diet- has been eating better recently and exercise up to 3x a week since news of a1c, lacking before that time  Lab Results  Component Value Date   HGBA1C 11.3* 10/21/2015   HGBA1C 6.5 02/12/2015   HGBA1C 7.6* 10/16/2014   A/P: advised insulin due to drastic poor control, Patient declined. Will increase to janumet 50-1000 BID from 50-500 BID and addamaryl 61m. Follow up in 3 months. If can get a1c below 9, add diabetes nutrition only and titration of amaryl. If  cannot within 3 months- push again for insulin Patient points to weight loss of 6 lbs but I suspect this was from dehydration from diabetes though volume status does appear reasonable today  Hypertension S: controlled previously but poor control today BP Readings from Last 3 Encounters:  10/28/15 148/72  02/18/15 120/74  10/22/14 130/72  A/P: refuses BP medication, plans to work on diet/exercise   Hyperlipidemia S: poorly controlled on no statin. On fish oil and fenofibric acid.  Lab Results  Component Value Date   CHOL 208* 10/21/2015   HDL 27.80* 10/21/2015   LDLCALC 149* 06/05/2014   LDLDIRECT 121.0 10/21/2015   TRIG 294.0* 10/21/2015   CHOLHDL 7 10/21/2015   A/P: declines statin despite risks-  very firmly declines       Return in about 3 months (around 01/25/2016) for follow up- or sooner if needed. Return precautions advised.   Orders Placed This Encounter  Procedures  . POC Hemoccult Bld/Stl (3-Cd Home Screen)    Send home    Standing Status: Future     Number of Occurrences:      Standing Expiration Date: 10/27/2016    Meds ordered this encounter  Medications  . sitaGLIPtin-metformin (JANUMET) 50-1000 MG tablet    Sig: Take 1 tablet by mouth 2 (two) times daily with a meal.    Dispense:  180 tablet    Refill:  3  . glimepiride (AMARYL) 4 MG tablet    Sig: Take 1 tablet (4 mg total) by mouth daily before breakfast.    Dispense:  90 tablet    Refill:  3

## 2015-10-28 NOTE — Assessment & Plan Note (Addendum)
S: very poorly controlled due to extreme noncompliance with healthy eating despite being On janumet 50-500 BID CBGs- Was getting around 400 when first started checking, now down to around 220. Watching what he is eating, smaller portions, no white breads. Had been drinking a lot of OJ and has cut this out. Thirsty, peeing a lot, dry mouth.  Exercise and diet- has been eating better recently and exercise up to 3x a week since news of a1c, lacking before that time  Lab Results  Component Value Date   HGBA1C 11.3* 10/21/2015   HGBA1C 6.5 02/12/2015   HGBA1C 7.6* 10/16/2014   A/P: advised insulin due to drastic poor control, Patient declined. Will increase to janumet 50-1000 BID from 50-500 BID and addamaryl 48m. Follow up in 3 months. If can get a1c below 9, add diabetes nutrition only and titration of amaryl. If cannot within 3 months- push again for insulin Patient points to weight loss of 6 lbs but I suspect this was from dehydration from diabetes though volume status does appear reasonable today

## 2015-10-28 NOTE — Patient Instructions (Addendum)
Sign release of information at the check out desk for records from eye doctor- can also fill out then drop off once you find name out  Stop by lab to get stool cards.   Blood pressure too high today. BP Readings from Last 3 Encounters:  10/28/15 148/72  02/18/15 120/74  10/22/14 130/72  You declined medication at this time, if consistently high- I will strongly advocate  Increase janumet to 50-1039m pills, still take twice a day Also start Glimepride/amaryl 450mConsider diabetes education as next step if we can get under 9 over next few months but not quite to goal. If above 9, consider strongly using insulin

## 2015-10-28 NOTE — Assessment & Plan Note (Signed)
S: controlled previously but poor control today BP Readings from Last 3 Encounters:  10/28/15 148/72  02/18/15 120/74  10/22/14 130/72  A/P: refuses BP medication, plans to work on diet/exercise

## 2015-10-28 NOTE — Assessment & Plan Note (Signed)
S: poorly controlled on no statin. On fish oil and fenofibric acid.  Lab Results  Component Value Date   CHOL 208* 10/21/2015   HDL 27.80* 10/21/2015   LDLCALC 149* 06/05/2014   LDLDIRECT 121.0 10/21/2015   TRIG 294.0* 10/21/2015   CHOLHDL 7 10/21/2015   A/P: declines statin despite risks- very firmly declines

## 2015-11-04 ENCOUNTER — Encounter: Payer: Self-pay | Admitting: Family Medicine

## 2016-01-30 ENCOUNTER — Ambulatory Visit (INDEPENDENT_AMBULATORY_CARE_PROVIDER_SITE_OTHER): Payer: BLUE CROSS/BLUE SHIELD | Admitting: Family Medicine

## 2016-01-30 ENCOUNTER — Encounter: Payer: Self-pay | Admitting: Family Medicine

## 2016-01-30 ENCOUNTER — Other Ambulatory Visit: Payer: Self-pay | Admitting: Family Medicine

## 2016-01-30 VITALS — BP 138/92 | HR 98 | Temp 98.0°F | Ht 69.0 in | Wt 270.0 lb

## 2016-01-30 DIAGNOSIS — W57XXXA Bitten or stung by nonvenomous insect and other nonvenomous arthropods, initial encounter: Secondary | ICD-10-CM | POA: Diagnosis not present

## 2016-01-30 DIAGNOSIS — T148 Other injury of unspecified body region: Secondary | ICD-10-CM

## 2016-01-30 DIAGNOSIS — E1165 Type 2 diabetes mellitus with hyperglycemia: Secondary | ICD-10-CM

## 2016-01-30 DIAGNOSIS — I10 Essential (primary) hypertension: Secondary | ICD-10-CM | POA: Diagnosis not present

## 2016-01-30 DIAGNOSIS — IMO0001 Reserved for inherently not codable concepts without codable children: Secondary | ICD-10-CM

## 2016-01-30 DIAGNOSIS — E785 Hyperlipidemia, unspecified: Secondary | ICD-10-CM

## 2016-01-30 LAB — COMPREHENSIVE METABOLIC PANEL
ALBUMIN: 4.9 g/dL (ref 3.5–5.2)
ALK PHOS: 35 U/L — AB (ref 39–117)
ALT: 51 U/L (ref 0–53)
AST: 34 U/L (ref 0–37)
BUN: 20 mg/dL (ref 6–23)
CALCIUM: 10.2 mg/dL (ref 8.4–10.5)
CO2: 23 mEq/L (ref 19–32)
Chloride: 105 mEq/L (ref 96–112)
Creatinine, Ser: 1.21 mg/dL (ref 0.40–1.50)
GFR: 65.12 mL/min (ref >60.00)
Glucose, Bld: 107 mg/dL — ABNORMAL HIGH (ref 70–99)
POTASSIUM: 4.2 meq/L (ref 3.5–5.1)
Sodium: 139 mEq/L (ref 135–145)
TOTAL PROTEIN: 7.4 g/dL (ref 6.0–8.3)
Total Bilirubin: 0.5 mg/dL (ref 0.2–1.2)

## 2016-01-30 LAB — HEMOGLOBIN A1C: HEMOGLOBIN A1C: 6.7 % — AB (ref 4.6–6.5)

## 2016-01-30 NOTE — Progress Notes (Signed)
Pre visit review using our clinic review tool, if applicable. No additional management support is needed unless otherwise documented below in the visit note. 

## 2016-01-30 NOTE — Assessment & Plan Note (Signed)
S: poorly controlled. On recheck 142/92  BP Readings from Last 3 Encounters:  01/30/16 138/92  10/28/15 148/72  02/18/15 120/74  A/P: he is convinced this is white coat. Encouraged home monitoring but if remains high- will need medication- actually discussed medication now givne uncontrolled hyperlipidemia but he declines. He is aware of MI and CVA risks but states "I don't want to live forever"

## 2016-01-30 NOTE — Patient Instructions (Signed)
Check labs before you leave including lyme antibody test   IF a1c is at least under 10, willing to continue trying to tweak oral medications. Would be my STRONG advisement otherwise to start insulin.   Your blood pressure trend concerns me. I would like for you to buy/use a home cuff to check at least 4x a week. Your goal is <140/90. If you note in the next few weeks that it is higher than our goal, see me sooner. Otherwise, see me in 4-6 weeks. Bring your home cuff and your log of blood pressures with you to visit.   You declined cholesterol medicine

## 2016-01-30 NOTE — Assessment & Plan Note (Signed)
S: poorly controlled. On janumet 50-154m BID from 50-500 BID previously. Added amaryl 472m  CBGs- does not check Exercise and diet- states 45 mins walking treadmill 5 days a week, claims healthy diet Lab Results  Component Value Date   HGBA1C 11.3* 10/21/2015   HGBA1C 6.5 02/12/2015   HGBA1C 7.6* 10/16/2014   A/P: patient adamantly opposed to insulin- likely next step will be jardiance or invokana as well as max out amaryl. He is convinced his medicines are what stop him from losing weight- very low motivation and I am really concerned about him long run. Discussed risks of not controlling different disease processes and he states that "I am convinced people that listen to their doctors and take their medicines are the ones that end of the nursing home"

## 2016-01-30 NOTE — Assessment & Plan Note (Signed)
S: poorly controlled on no statin- is on fish oil and fenofibrate. No myalgias.  Lab Results  Component Value Date   CHOL 208* 10/21/2015   HDL 27.80* 10/21/2015   LDLCALC 149* 06/05/2014   LDLDIRECT 121.0 10/21/2015   TRIG 294.0* 10/21/2015   CHOLHDL 7 10/21/2015   A/P: continue current medicines- he declines statin again

## 2016-01-30 NOTE — Progress Notes (Signed)
Subjective:  Mark Frey is a 60 y.o. year old very pleasant male patient who presents for/with See problem oriented charting ROS- no fever, chills, nausea, vomiting, headaches, shortness of breath, or chest pain.see any ROS included in HPI as well.   Past Medical History-  Patient Active Problem List   Diagnosis Date Noted  . Diabetes mellitus type II, uncontrolled ([place]) 06/05/2014    Priority: High  . Hypertension 06/05/2014    Priority: Medium  . Irritable bowel syndrome 03/30/2010    Priority: Medium  . Hyperlipidemia 05/12/2007    Priority: Medium  . Morbid obesity ([place]) 12/29/2009    Priority: Low  . Dysmetabolic syndrome X 38/18/2993    Priority: Low  . BACK PAIN WITH RADICULOPATHY 06/09/2007    Priority: Low  . ALLERGIC RHINITIS 05/12/2007    Priority: Low    Medications- reviewed and updated Current Outpatient Prescriptions  Medication Sig Dispense Refill  . aspirin 81 MG tablet Take 81 mg by mouth daily.    . cetirizine (ZYRTEC) 10 MG tablet Take 10 mg by mouth 2 (two) times daily as needed.      . Choline Fenofibrate (FENOFIBRIC ACID) 135 MG CPDR TAKE 1 CAPSULE DAILY 90 capsule 2  . fish oil-omega-3 fatty acids 1000 MG capsule Take 1 g by mouth 3 (three) times daily.      . fluticasone (FLONASE) 50 MCG/ACT nasal spray 1 spray by Nasal route as needed.      [name] glimepiride (AMARYL) 4 MG tablet Take 1 tablet (4 mg total) by mouth daily before breakfast. 90 tablet 3  . glucosamine-chondroitin 500-400 MG tablet Take 1 tablet by mouth daily.      [name] glucose blood (FREESTYLE TEST STRIPS) test strip 1 each by Other route daily. Use as instructed     . Lancets (FREESTYLE) lancets 1 each by Other route as directed. Use as instructed     . Multiple Vitamin (MULTIVITAMIN) capsule Take 1 capsule by mouth daily.      . sitaGLIPtin-metformin (JANUMET) 50-1000 MG tablet Take 1 tablet by mouth 2 (two) times daily with a meal. 180 tablet 3   No current facility-administered  medications for this visit.    Objective: BP 138/92 mmHg  Pulse 98  Temp(Src) 98 F (36.7 C) (Oral)  Ht 5' 9"  (1.753 m)  Wt 270 lb (122.471 kg)  BMI 39.85 kg/m2 Gen: NAD, resting comfortably CV: RRR no murmurs rubs or gallops Lungs: CTAB no crackles, wheeze, rhonchi Abdomen: soft/nontender/nondistended/normal bowel sounds. No rebound or guarding. obese Ext: no edema Skin: warm, dry Neuro: grossly normal, moves all extremities  Assessment/Plan:  Diabetes mellitus type II, uncontrolled ([place]) S: poorly controlled. On janumet 50-159m BID from 50-500 BID previously. Added amaryl 418m  CBGs- does not check Exercise and diet- states 45 mins walking treadmill 5 days a week, claims healthy diet Lab Results  Component Value Date   HGBA1C 11.3* 10/21/2015   HGBA1C 6.5 02/12/2015   HGBA1C 7.6* 10/16/2014   A/P: patient adamantly opposed to insulin- likely next step will be jardiance or invokana as well as max out amaryl. He is convinced his medicines are what stop him from losing weight- very low motivation and I am really concerned about him long run. Discussed risks of not controlling different disease processes and he states that "I am convinced people that listen to their doctors and take their medicines are the ones that end of the nursing home"  Hypertension S: poorly controlled. On recheck 142/92  BP Readings from Last 3 Encounters:  01/30/16 138/92  10/28/15 148/72  02/18/15 120/74  A/P: he is convinced this is white coat. Encouraged home monitoring but if remains high- will need medication- actually discussed medication now givne uncontrolled hyperlipidemia but he declines. He is aware of MI and CVA risks but states "I don't want to live forever"  Hyperlipidemia S: poorly controlled on no statin- is on fish oil and fenofibrate. No myalgias.  Lab Results  Component Value Date   CHOL 208* 10/21/2015   HDL 27.80* 10/21/2015   LDLCALC 149* 06/05/2014   LDLDIRECT 121.0  10/21/2015   TRIG 294.0* 10/21/2015   CHOLHDL 7 10/21/2015   A/P: continue current medicines- he declines statin again  Tick bite - Plan: B. burgdorfi Antibody. Over a month ago. Asymptomatic. Strongly requests lyme testing. Discussed low yield likely but he would like regardless. Bite was in [place].   3 months and a day verbal advised. Return precautions advised.   Orders Placed This Encounter  Procedures  . B. burgdorfi Antibody  . Comprehensive metabolic panel    [place]  . Hemoglobin A1c    [place]   Garret Reddish, MD

## 2016-02-02 ENCOUNTER — Telehealth: Payer: Self-pay | Admitting: Family Medicine

## 2016-02-02 NOTE — Telephone Encounter (Signed)
Reference number: [id number]

## 2016-02-02 NOTE — Telephone Encounter (Signed)
[facility] called to leave results, will you please give them a call at [phone] option 2 for the results.S

## 2016-02-04 LAB — LYME AB/WESTERN BLOT REFLEX: B burgdorferi Ab IgG+IgM: <0.9 Index (ref <0.90)

## 2016-03-02 ENCOUNTER — Other Ambulatory Visit: Payer: Self-pay | Admitting: Family Medicine

## 2016-05-04 ENCOUNTER — Ambulatory Visit (INDEPENDENT_AMBULATORY_CARE_PROVIDER_SITE_OTHER): Payer: BLUE CROSS/BLUE SHIELD | Admitting: Family Medicine

## 2016-05-04 ENCOUNTER — Encounter: Payer: Self-pay | Admitting: Family Medicine

## 2016-05-04 VITALS — BP 148/88 | HR 92 | Temp 98.0°F | Wt 270.4 lb

## 2016-05-04 DIAGNOSIS — IMO0001 Reserved for inherently not codable concepts without codable children: Secondary | ICD-10-CM

## 2016-05-04 DIAGNOSIS — I10 Essential (primary) hypertension: Secondary | ICD-10-CM | POA: Diagnosis not present

## 2016-05-04 DIAGNOSIS — E1165 Type 2 diabetes mellitus with hyperglycemia: Secondary | ICD-10-CM | POA: Diagnosis not present

## 2016-05-04 DIAGNOSIS — E785 Hyperlipidemia, unspecified: Secondary | ICD-10-CM

## 2016-05-04 LAB — COMPREHENSIVE METABOLIC PANEL
ALBUMIN: 4.8 g/dL (ref 3.5–5.2)
ALT: 48 U/L (ref 0–53)
AST: 38 U/L — ABNORMAL HIGH (ref 0–37)
Alkaline Phosphatase: 34 U/L — ABNORMAL LOW (ref 39–117)
BUN: 23 mg/dL (ref 6–23)
CHLORIDE: 106 meq/L (ref 96–112)
CO2: 23 mEq/L (ref 19–32)
Calcium: 9.5 mg/dL (ref 8.4–10.5)
Creatinine, Ser: 1.24 mg/dL (ref 0.40–1.50)
GFR: 63.25 mL/min (ref >60.00)
Glucose, Bld: 116 mg/dL — ABNORMAL HIGH (ref 70–99)
POTASSIUM: 4.1 meq/L (ref 3.5–5.1)
SODIUM: 139 meq/L (ref 135–145)
Total Bilirubin: 0.5 mg/dL (ref 0.2–1.2)
Total Protein: 7.8 g/dL (ref 6.0–8.3)

## 2016-05-04 LAB — CBC
HEMATOCRIT: 43.6 % (ref 39.0–52.0)
Hemoglobin: 14.9 g/dL (ref 13.0–17.0)
MCHC: 34.2 g/dL (ref 30.0–36.0)
MCV: 93.4 fl (ref 78.0–100.0)
Platelets: 256 10*3/uL (ref 150.0–400.0)
RBC: 4.67 Mil/uL (ref 4.22–5.81)
RDW: 13.9 % (ref 11.5–15.5)
WBC: 6.4 10*3/uL (ref 4.0–10.5)

## 2016-05-04 LAB — HEMOGLOBIN A1C: HEMOGLOBIN A1C: 6.2 % (ref 4.6–6.5)

## 2016-05-04 NOTE — Assessment & Plan Note (Signed)
S: controlled on home reports- poor control in office. Home cuff readings have been normal he reports but cant state number.  BP Readings from Last 3 Encounters:  05/04/16 (!) 148/88  01/30/16 (!) 138/92  10/28/15 (!) 148/72  A/P:declines BP meds- aware of cardiac and CVA risks

## 2016-05-04 NOTE — Progress Notes (Signed)
Pre visit review using our clinic review tool, if applicable. No additional management support is needed unless otherwise documented below in the visit note. 

## 2016-05-04 NOTE — Progress Notes (Signed)
Subjective:  Mark Frey is a 60 y.o. year old very pleasant male patient who presents for/with See problem oriented charting ROS- No chest pain or shortness of breath. No headache or blurry vision. .see any ROS included in HPI as well.   Past Medical History-  Patient Active Problem List   Diagnosis Date Noted  . Diabetes mellitus type II, uncontrolled ([place]) 06/05/2014    Priority: High  . Hypertension 06/05/2014    Priority: Medium  . Irritable bowel syndrome 03/30/2010    Priority: Medium  . Hyperlipidemia 05/12/2007    Priority: Medium  . Morbid obesity ([place]) 12/29/2009    Priority: Low  . Dysmetabolic syndrome X 33/54/5625    Priority: Low  . BACK PAIN WITH RADICULOPATHY 06/09/2007    Priority: Low  . ALLERGIC RHINITIS 05/12/2007    Priority: Low    Medications- reviewed and updated Current Outpatient Prescriptions  Medication Sig Dispense Refill  . aspirin 81 MG tablet Take 81 mg by mouth daily.    . cetirizine (ZYRTEC) 10 MG tablet Take 10 mg by mouth 2 (two) times daily as needed.      . Choline Fenofibrate (FENOFIBRIC ACID) 135 MG CPDR TAKE 1 CAPSULE DAILY 90 capsule 1  . fish oil-omega-3 fatty acids 1000 MG capsule Take 1 g by mouth 3 (three) times daily.      . fluticasone (FLONASE) 50 MCG/ACT nasal spray 1 spray by Nasal route as needed.      [name] glimepiride (AMARYL) 4 MG tablet Take 1 tablet (4 mg total) by mouth daily before breakfast. 90 tablet 3  . glucosamine-chondroitin 500-400 MG tablet Take 1 tablet by mouth daily.      [name] glucose blood (FREESTYLE TEST STRIPS) test strip 1 each by Other route daily. Use as instructed     . Lancets (FREESTYLE) lancets 1 each by Other route as directed. Use as instructed     . Multiple Vitamin (MULTIVITAMIN) capsule Take 1 capsule by mouth daily.      . sitaGLIPtin-metformin (JANUMET) 50-1000 MG tablet Take 1 tablet by mouth 2 (two) times daily with a meal. 180 tablet 3   No current facility-administered medications for this  visit.     Objective: BP (!) 148/88   Pulse 92   Temp 98 F (36.7 C) (Oral)   Wt 270 lb 6.4 oz (122.7 kg)   SpO2 95%   BMI 39.93 kg/m  Gen: NAD, resting comfortably CV: RRR no murmurs rubs or gallops Lungs: CTAB no crackles, wheeze, rhonchi Abdomen: soft/nontender/nondistended/normal bowel sounds. No rebound or guarding.  Ext: trace edema Skin: warm, dry Neuro: grossly normal, moves all extremities  Assessment/Plan:  Diabetes mellitus type II, uncontrolled (H[facility]) S: unknown control- has varied in past. On janumet 50-1043m BID and amaryl 422m  CBGs- does not check Exercise and diet- last visit walking 45 mins treadmill 5 days a week, claims health diet- today same. Has lost no weight Lab Results  Component Value Date   HGBA1C 6.7 (H) 01/30/2016   HGBA1C 11.3 (H) 10/21/2015   HGBA1C 6.5 02/12/2015   A/P: continue current meds, await a1c  Hypertension S: controlled on home reports- poor control in office. Home cuff readings have been normal he reports but cant state number.  BP Readings from Last 3 Encounters:  05/04/16 (!) 148/88  01/30/16 (!) 138/92  10/28/15 (!) 148/72  A/P:declines BP meds- aware of cardiac and CVA risks  Hyperlipidemia S: poorly controlled on no meds. No myalgias.  Lab Results  Component Value Date   CHOL 208 (H) 10/21/2015   HDL 27.80 (L) 10/21/2015   LDLCALC 149 (H) 06/05/2014   LDLDIRECT 121.0 10/21/2015   TRIG 294.0 (H) 10/21/2015   CHOLHDL 7 10/21/2015   A/P: with htn, dm, hld- recommend statin- he declines despite risks  4 months written in  Orders Placed This Encounter  Procedures  . CBC    [place]  . Comprehensive metabolic panel    [place]  . Hemoglobin A1c    [place]   Return precautions advised.  Garret Reddish, MD

## 2016-05-04 NOTE — Patient Instructions (Addendum)
Labs before you leave  Discussed need for cholesterol medicine and BP meds again- you declined. We respect your decision. We want you to be around healthily as long as possible  Health Maintenance Due  Topic Date Due  . OPHTHALMOLOGY EXAM  03/23/2016  advise an updated exam- have them send [place] results

## 2016-05-04 NOTE — Assessment & Plan Note (Signed)
S: poorly controlled on no meds. No myalgias.  Lab Results  Component Value Date   CHOL 208 (H) 10/21/2015   HDL 27.80 (L) 10/21/2015   LDLCALC 149 (H) 06/05/2014   LDLDIRECT 121.0 10/21/2015   TRIG 294.0 (H) 10/21/2015   CHOLHDL 7 10/21/2015   A/P: with htn, dm, hld- recommend statin- he declines despite risks

## 2016-05-04 NOTE — Assessment & Plan Note (Signed)
S: unknown control- has varied in past. On janumet 50-1073m BID and amaryl 439m  CBGs- does not check Exercise and diet- last visit walking 45 mins treadmill 5 days a week, claims health diet- today same. Has lost no weight Lab Results  Component Value Date   HGBA1C 6.7 (H) 01/30/2016   HGBA1C 11.3 (H) 10/21/2015   HGBA1C 6.5 02/12/2015   A/P: continue current meds, await a1c

## 2016-08-30 ENCOUNTER — Other Ambulatory Visit: Payer: Self-pay | Admitting: Family Medicine

## 2016-09-03 ENCOUNTER — Ambulatory Visit: Payer: BLUE CROSS/BLUE SHIELD | Admitting: Family Medicine

## 2016-09-16 ENCOUNTER — Other Ambulatory Visit: Payer: Self-pay | Admitting: *Deleted

## 2016-09-16 MED ORDER — GLIMEPIRIDE 4 MG PO TABS: 4.0000 mg | ORAL_TABLET | Freq: Every day | ORAL | 3 refills | Status: DC

## 2016-09-16 MED ORDER — FENOFIBRIC ACID 135 MG PO CPDR: 1.0000 | DELAYED_RELEASE_CAPSULE | Freq: Every day | ORAL | 1 refills | Status: DC

## 2016-09-16 MED ORDER — SITAGLIPTIN PHOS-METFORMIN HCL 50-1000 MG PO TABS: 1.0000 | ORAL_TABLET | Freq: Two times a day (BID) | ORAL | 3 refills | Status: DC

## 2016-10-04 ENCOUNTER — Other Ambulatory Visit: Payer: Self-pay | Admitting: Family Medicine

## 2016-10-25 ENCOUNTER — Telehealth: Payer: Self-pay | Admitting: Family Medicine

## 2016-10-25 ENCOUNTER — Encounter: Payer: Self-pay | Admitting: Family Medicine

## 2016-10-25 ENCOUNTER — Ambulatory Visit (INDEPENDENT_AMBULATORY_CARE_PROVIDER_SITE_OTHER): Payer: BLUE CROSS/BLUE SHIELD | Admitting: Family Medicine

## 2016-10-25 DIAGNOSIS — IMO0001 Reserved for inherently not codable concepts without codable children: Secondary | ICD-10-CM

## 2016-10-25 DIAGNOSIS — E785 Hyperlipidemia, unspecified: Secondary | ICD-10-CM

## 2016-10-25 DIAGNOSIS — I10 Essential (primary) hypertension: Secondary | ICD-10-CM

## 2016-10-25 DIAGNOSIS — E1165 Type 2 diabetes mellitus with hyperglycemia: Secondary | ICD-10-CM | POA: Diagnosis not present

## 2016-10-25 LAB — COMPREHENSIVE METABOLIC PANEL WITH GFR
ALT: 59 U/L — ABNORMAL HIGH (ref 0–53)
AST: 33 U/L (ref 0–37)
Albumin: 4.6 g/dL (ref 3.5–5.2)
Alkaline Phosphatase: 44 U/L (ref 39–117)
BUN: 15 mg/dL (ref 6–23)
CO2: 24 meq/L (ref 19–32)
Calcium: 9.9 mg/dL (ref 8.4–10.5)
Chloride: 104 meq/L (ref 96–112)
Creatinine, Ser: 1.13 mg/dL (ref 0.40–1.50)
GFR: 70.29 mL/min
Glucose, Bld: 159 mg/dL — ABNORMAL HIGH (ref 70–99)
Potassium: 3.9 meq/L (ref 3.5–5.1)
Sodium: 136 meq/L (ref 135–145)
Total Bilirubin: 0.4 mg/dL (ref 0.2–1.2)
Total Protein: 7.1 g/dL (ref 6.0–8.3)

## 2016-10-25 LAB — HEMOGLOBIN A1C: Hgb A1c MFr Bld: 7.8 % — ABNORMAL HIGH (ref 4.6–6.5)

## 2016-10-25 MED ORDER — ORLISTAT 120 MG PO CAPS: 120.0000 mg | ORAL_CAPSULE | Freq: Three times a day (TID) | ORAL | 3 refills | Status: DC

## 2016-10-25 NOTE — Telephone Encounter (Signed)
Pt was seen today and has questions concerning  Orlistat med and would like a callback from [facility]

## 2016-10-25 NOTE — Patient Instructions (Addendum)
My recommendation would be to start blood pressure medicine. You declined this.   As alternate- we agreed to orlistat 147m three times a day with meals. Goal is to get weight back to 270 within 12 weeks. Need to restart treadmill as well.   Please stop by lab before you go

## 2016-10-25 NOTE — Progress Notes (Signed)
Subjective:  Mark Frey is a 61 y.o. year old very pleasant male patient who presents for/with See problem oriented charting ROS- No chest pain or shortness of breath. No headache or blurry vision.    Past Medical History-  Patient Active Problem List   Diagnosis Date Noted  . Diabetes mellitus type II, uncontrolled ([place]) 06/05/2014    Priority: High  . Morbid obesity ([place]) 12/29/2009    Priority: High  . Hypertension 06/05/2014    Priority: Medium  . Irritable bowel syndrome 03/30/2010    Priority: Medium  . Hyperlipidemia 05/12/2007    Priority: Medium  . Dysmetabolic syndrome X 40/98/1191    Priority: Low  . BACK PAIN WITH RADICULOPATHY 06/09/2007    Priority: Low  . ALLERGIC RHINITIS 05/12/2007    Priority: Low    Medications- reviewed and updated Current Outpatient Prescriptions  Medication Sig Dispense Refill  . aspirin 81 MG tablet Take 81 mg by mouth daily.    . cetirizine (ZYRTEC) 10 MG tablet Take 10 mg by mouth 2 (two) times daily as needed.      . Choline Fenofibrate (FENOFIBRIC ACID) 135 MG CPDR Take 1 capsule by mouth daily. 90 capsule 1  . fish oil-omega-3 fatty acids 1000 MG capsule Take 1 g by mouth 3 (three) times daily.      . fluticasone (FLONASE) 50 MCG/ACT nasal spray 1 spray by Nasal route as needed.      [name] glimepiride (AMARYL) 4 MG tablet TAKE 1 TABLET DAILY BEFORE BREAKFAST 90 tablet 3  . glucosamine-chondroitin 500-400 MG tablet Take 1 tablet by mouth daily.      [name] glucose blood (FREESTYLE TEST STRIPS) test strip 1 each by Other route daily. Use as instructed     . JANUMET 50-1000 MG tablet TAKE 1 TABLET TWICE A DAY WITH MEALS 180 tablet 3  . Lancets (FREESTYLE) lancets 1 each by Other route as directed. Use as instructed     . Multiple Vitamin (MULTIVITAMIN) capsule Take 1 capsule by mouth daily.      [name] orlistat (XENICAL) 120 MG capsule Take 1 capsule (120 mg total) by mouth 3 (three) times daily with meals. 90 capsule 3   No current  facility-administered medications for this visit.     Objective: BP (!) 156/84   Pulse (!) 119   Temp 98.1 F (36.7 C) (Oral)   Ht 5' 9"  (1.753 m)   Wt 285 lb 6.4 oz (129.5 kg)   SpO2 95%   BMI 42.15 kg/m  Gen: NAD, resting comfortably CV: RRR no murmurs rubs or gallops Lungs: CTAB no crackles, wheeze, rhonchi Abdomen: soft/nontender/nondistended/normal bowel sounds. Morbid obesity Ext: trace edema Skin: warm, dry  Assessment/Plan:   Diabetes mellitus type II, uncontrolled (HCC) S: worsening control On same regimen- janumet 50-1000 BID, amaryl 5m. Refusing insulin. This is due to patient stopping his treadmill 45 minutes for 5 days a week because he was "tired of it" and his diet not improving- his weight is up 15 lbs.  Lab Results  Component Value Date   HGBA1C 7.8 (H) 10/25/2016   HGBA1C 6.2 05/04/2016   HGBA1C 6.7 (H) 01/30/2016   A/P: Patient does not want to change medication at present- wants to work on weight loss/exercise/healthy eating again. Would consider going up on amaryl at follow up but issue would be potential weight gain which he cannot afford. Would likely push instead if he is willing for sglt2 inhibitor option.    Morbid obesity ([place] S:  patient knows he has eaten poorly and think sthis in combo with not changing diet led to weight gain. He states has huge appetite and wants a medication that may help with weight loss as does not think he can change his desire for food A/P: opted to trial orlistat as already on metformin. Could consider med like trulicity if he would be willing to trial injection. TID at 128m and follow up in 3 months. Goal weight at least 15 lbs or back to 270- discussed stopping if has not made that progress   Hypertension S: controlled poorly on no medication and up with weight gain.  BP Readings from Last 3 Encounters:  10/25/16 (!) 156/84  05/04/16 (!) 148/88  01/30/16 (!) 138/92  A/P: firmly declines meds- aware of cardiac and  cva risks. Unclear why willing to consider weight loss meds but not hypertension meds- likely because he only wants short term medication solution.    Hyperlipidemia Continues to decline statin. He is taking fish oil and fenofibric acid. Weight loss is key given his desire to remain off additional medicine.   14 week follow up  Orders Placed This Encounter  Procedures  . Comprehensive metabolic panel    [place]  . Hemoglobin A1c    [place]    Meds ordered this encounter  Medications  . orlistat (XENICAL) 120 MG capsule    Sig: Take 1 capsule (120 mg total) by mouth 3 (three) times daily with meals.    Dispense:  90 capsule    Refill:  3    Return precautions advised.  SGarret Reddish MD

## 2016-10-25 NOTE — Assessment & Plan Note (Signed)
Continues to decline statin. He is taking fish oil and fenofibric acid. Weight loss is key given his desire to remain off additional medicine.

## 2016-10-25 NOTE — Assessment & Plan Note (Signed)
S: patient knows he has eaten poorly and think sthis in combo with not changing diet led to weight gain. He states has huge appetite and wants a medication that may help with weight loss as does not think he can change his desire for food A/P: opted to trial orlistat as already on metformin. Could consider med like trulicity if he would be willing to trial injection. TID at 14m and follow up in 3 months. Goal weight at least 15 lbs or back to 270- discussed stopping if has not made that progress

## 2016-10-25 NOTE — Assessment & Plan Note (Signed)
S: controlled poorly on no medication and up with weight gain.  BP Readings from Last 3 Encounters:  10/25/16 (!) 156/84  05/04/16 (!) 148/88  01/30/16 (!) 138/92  A/P: firmly declines meds- aware of cardiac and cva risks. Unclear why willing to consider weight loss meds but not hypertension meds- likely because he only wants short term medication solution.

## 2016-10-25 NOTE — Progress Notes (Signed)
Pre visit review using our clinic review tool, if applicable. No additional management support is needed unless otherwise documented below in the visit note. 

## 2016-10-25 NOTE — Assessment & Plan Note (Signed)
S: worsening control On same regimen- janumet 50-1000 BID, amaryl 66m. Refusing insulin. This is due to patient stopping his treadmill 45 minutes for 5 days a week because he was "tired of it" and his diet not improving- his weight is up 15 lbs.  Lab Results  Component Value Date   HGBA1C 7.8 (H) 10/25/2016   HGBA1C 6.2 05/04/2016   HGBA1C 6.7 (H) 01/30/2016   A/P: Patient does not want to change medication at present- wants to work on weight loss/exercise/healthy eating again. Would consider going up on amaryl at follow up but issue would be potential weight gain which he cannot afford. Would likely push instead if he is willing for sglt2 inhibitor option.

## 2016-10-26 NOTE — Telephone Encounter (Addendum)
Mark Frey took the call

## 2016-10-26 NOTE — Telephone Encounter (Signed)
Spoke with patient who states that he spoke with a pharmacist Brad and discussed weight loss medication. He has opted not to try this medication as he states he has taken it before and was not successful with it. I spoke with Dr. Yong Channel and made him aware. I did stress the importance of weight loss with Mark Frey and we reviewed his labs

## 2017-01-31 ENCOUNTER — Ambulatory Visit (INDEPENDENT_AMBULATORY_CARE_PROVIDER_SITE_OTHER): Payer: BLUE CROSS/BLUE SHIELD | Admitting: Family Medicine

## 2017-01-31 ENCOUNTER — Encounter: Payer: Self-pay | Admitting: Family Medicine

## 2017-01-31 DIAGNOSIS — E1165 Type 2 diabetes mellitus with hyperglycemia: Secondary | ICD-10-CM

## 2017-01-31 DIAGNOSIS — E782 Mixed hyperlipidemia: Secondary | ICD-10-CM

## 2017-01-31 DIAGNOSIS — I10 Essential (primary) hypertension: Secondary | ICD-10-CM | POA: Diagnosis not present

## 2017-01-31 DIAGNOSIS — R000 Tachycardia, unspecified: Secondary | ICD-10-CM | POA: Diagnosis not present

## 2017-01-31 DIAGNOSIS — IMO0001 Reserved for inherently not codable concepts without codable children: Secondary | ICD-10-CM

## 2017-01-31 LAB — MICROALBUMIN / CREATININE URINE RATIO
Creatinine,U: 193.6 mg/dL
MICROALB UR: 56.6 mg/dL — AB (ref 0.0–1.9)
Microalb Creat Ratio: 29.2 mg/g (ref 0.0–30.0)

## 2017-01-31 LAB — COMPREHENSIVE METABOLIC PANEL
ALBUMIN: 4.7 g/dL (ref 3.5–5.2)
ALK PHOS: 42 U/L (ref 39–117)
ALT: 49 U/L (ref 0–53)
AST: 35 U/L (ref 0–37)
BILIRUBIN TOTAL: 0.5 mg/dL (ref 0.2–1.2)
BUN: 16 mg/dL (ref 6–23)
CALCIUM: 9.9 mg/dL (ref 8.4–10.5)
CO2: 22 mEq/L (ref 19–32)
CREATININE: 1.04 mg/dL (ref 0.40–1.50)
Chloride: 103 mEq/L (ref 96–112)
GFR: 77.28 mL/min (ref >60.00)
Glucose, Bld: 195 mg/dL — ABNORMAL HIGH (ref 70–99)
Potassium: 3.9 mEq/L (ref 3.5–5.1)
SODIUM: 135 meq/L (ref 135–145)
TOTAL PROTEIN: 7.4 g/dL (ref 6.0–8.3)

## 2017-01-31 LAB — LDL CHOLESTEROL, DIRECT: Direct LDL: 130 mg/dL

## 2017-01-31 LAB — HEMOGLOBIN A1C: HEMOGLOBIN A1C: 9.1 % — AB (ref 4.6–6.5)

## 2017-01-31 NOTE — Assessment & Plan Note (Addendum)
S: poorly controlled on no statin. Is on fenobric acid at least. No myalgias.  Lab Results  Component Value Date   CHOL 208 (H) 10/21/2015   HDL 27.80 (L) 10/21/2015   LDLCALC 149 (H) 06/05/2014   LDLDIRECT 121.0 10/21/2015   TRIG 294.0 (H) 10/21/2015   CHOLHDL 7 10/21/2015   A/P: Firmly declines statin- even a trial. Claims everyone he knows has myalgias. Seems to only be willing ot change mind if has event such as MI/CVA though my preference would be firmly in prevention camp. I think benefit of statin would be greater than fenofibric acid

## 2017-01-31 NOTE — Progress Notes (Signed)
Subjective:  Mark Frey is a 61 y.o. year old very pleasant male patient who presents for/with See problem oriented charting ROS- No chest pain or shortness of breath. No headache or blurry vision. No palpitations   Past Medical History-  Patient Active Problem List   Diagnosis Date Noted  . Diabetes mellitus type II, uncontrolled ([place]) 06/05/2014    Priority: High  . Morbid obesity ([place]) 12/29/2009    Priority: High  . Hypertension 06/05/2014    Priority: Medium  . Irritable bowel syndrome 03/30/2010    Priority: Medium  . Hyperlipidemia 05/12/2007    Priority: Medium  . Dysmetabolic syndrome X 33/82/5053    Priority: Low  . BACK PAIN WITH RADICULOPATHY 06/09/2007    Priority: Low  . ALLERGIC RHINITIS 05/12/2007    Priority: Low  . Tachycardia 01/31/2017    Medications- reviewed and updated Current Outpatient Prescriptions  Medication Sig Dispense Refill  . aspirin 81 MG tablet Take 81 mg by mouth daily.    . cetirizine (ZYRTEC) 10 MG tablet Take 10 mg by mouth 2 (two) times daily as needed.      . Choline Fenofibrate (FENOFIBRIC ACID) 135 MG CPDR Take 1 capsule by mouth daily. 90 capsule 1  . fish oil-omega-3 fatty acids 1000 MG capsule Take 1 g by mouth 3 (three) times daily.      . fluticasone (FLONASE) 50 MCG/ACT nasal spray 1 spray by Nasal route as needed.      [name] glimepiride (AMARYL) 4 MG tablet TAKE 1 TABLET DAILY BEFORE BREAKFAST 90 tablet 3  . glucosamine-chondroitin 500-400 MG tablet Take 1 tablet by mouth daily.      [name] glucose blood (FREESTYLE TEST STRIPS) test strip 1 each by Other route daily. Use as instructed     . JANUMET 50-1000 MG tablet TAKE 1 TABLET TWICE A DAY WITH MEALS 180 tablet 3  . Lancets (FREESTYLE) lancets 1 each by Other route as directed. Use as instructed     . Multiple Vitamin (MULTIVITAMIN) capsule Take 1 capsule by mouth daily.       No current facility-administered medications for this visit.     Objective: BP 138/86   Pulse (!)  114   Temp 98.3 F (36.8 C) (Oral)   Ht 5' 9"  (1.753 m)   Wt 279 lb 9.6 oz (126.8 kg)   SpO2 96%   BMI 41.29 kg/m  Gen: NAD, resting comfortably CV: tachycardic but regular rhythm, no murmurs rubs or gallops Lungs: CTAB no crackles, wheeze, rhonchi Abdomen: morbid obesity Ext: no edema Skin: warm, dry Neuro: grossly normal, moves all extremities Diabetic Foot Exam - Simple   Simple Foot Form Diabetic Foot exam was performed with the following findings:  Yes 01/31/2017 10:52 AM  Visual Inspection No deformities, no ulcerations, no other skin breakdown bilaterally:  Yes Sensation Testing Intact to touch and monofilament testing bilaterally:  Yes Pulse Check Posterior Tibialis and Dorsalis pulse intact bilaterally:  Yes Comments    Assessment/Plan:  Diabetes mellitus type II, uncontrolled (HCC) S: hopefully controlled with weight loss and janumet 50-1089m BID, amaryl 458m Refused increase of meds last visit and has refused insulin. Worried about weight gain on amaryl CBGs- does not check Exercise and diet-  CBGs have varied a great deal depending on how active he is on treadmill-has been as high as 45 minute 5 days a week. He has lost 6 lbs after gaining 15 last visit. Upped exercise and cut down on food intake.  Lab  Results  Component Value Date   HGBA1C 7.8 (H) 10/25/2016   HGBA1C 6.2 05/04/2016   HGBA1C 6.7 (H) 01/30/2016   A/P: update labs today. Hopeful a1c 7.5 or less- will push for another 6 lbs off in 14 weeks  Watch LFTs- <1x alcohol a week. Suspect fatty liver.   Hypertension S: controlled with thigh cuff- large cuff too small. Seems to have improved with exercise ASCVD 10 year risk calculation if age 12-24: certainly above 82% but he declines statin- agrees to work on weight loss. See HLD discussion BP Readings from Last 3 Encounters:  01/31/17 138/86  10/25/16 (!) 156/84  05/04/16 (!) 148/88  A/P: We discussed blood pressure goal of <140/90. Actually at goal  today. Continuewithout meds and focus on exercise/weight loss  Hyperlipidemia S: poorly controlled on no statin. Is on fenobric acid at least. No myalgias.  Lab Results  Component Value Date   CHOL 208 (H) 10/21/2015   HDL 27.80 (L) 10/21/2015   LDLCALC 149 (H) 06/05/2014   LDLDIRECT 121.0 10/21/2015   TRIG 294.0 (H) 10/21/2015   CHOLHDL 7 10/21/2015   A/P: Firmly declines statin- even a trial. Claims everyone he knows has myalgias. Seems to only be willing ot change mind if has event such as MI/CVA though my preference would be firmly in prevention camp. I think benefit of statin would be greater than fenofibric acid   Tachycardia Advised EKG- he declines. He is aware of potential underlying cardiac issues and risk of death. He states only wants to do that if he is having symptoms.   Return in about 14 weeks (around 05/09/2017) for follow up- or sooner if needed. Agrees to update optho exam between now and next visit.   Orders Placed This Encounter  Procedures  . Comprehensive metabolic panel    [place]  . Hemoglobin A1c    [place]  . Microalbumin / creatinine urine ratio    [place]  . LDL cholesterol, direct    [place]   Return precautions advised.  Garret Reddish, MD

## 2017-01-31 NOTE — Patient Instructions (Addendum)
Please stop by lab before you go  You declined EKG today for fast heart rate. You are aware of potential risks of underlying heart rhythm issue that could ultimately result in death.   Glad you lost the weight! And didn't even need the orlistat  As long as a1c 7.7 or less- will keep working on weight loss  You declined cholesterol medicine to reduce cardiac risk  First visit blood pressure has been controlled in some time- weight loss helping- hopefully better #s will persist

## 2017-01-31 NOTE — Assessment & Plan Note (Signed)
S: hopefully controlled with weight loss and janumet 50-1022m BID, amaryl 480m Refused increase of meds last visit and has refused insulin. Worried about weight gain on amaryl CBGs- does not check Exercise and diet-  CBGs have varied a great deal depending on how active he is on treadmill-has been as high as 45 minute 5 days a week. He has lost 6 lbs after gaining 15 last visit. Upped exercise and cut down on food intake.  Lab Results  Component Value Date   HGBA1C 7.8 (H) 10/25/2016   HGBA1C 6.2 05/04/2016   HGBA1C 6.7 (H) 01/30/2016   A/P: update labs today. Hopeful a1c 7.5 or less- will push for another 6 lbs off in 14 weeks  Watch LFTs- <1x alcohol a week. Suspect fatty liver.

## 2017-01-31 NOTE — Assessment & Plan Note (Addendum)
S: controlled with thigh cuff- large cuff too small. Seems to have improved with exercise ASCVD 10 year risk calculation if age 61-14: certainly above 44% but he declines statin- agrees to work on weight loss. See HLD discussion BP Readings from Last 3 Encounters:  01/31/17 138/86  10/25/16 (!) 156/84  05/04/16 (!) 148/88  A/P: We discussed blood pressure goal of <140/90. Actually at goal today. Continuewithout meds and focus on exercise/weight loss

## 2017-01-31 NOTE — Assessment & Plan Note (Signed)
Advised EKG- he declines. He is aware of potential underlying cardiac issues and risk of death. He states only wants to do that if he is having symptoms.

## 2017-03-04 ENCOUNTER — Other Ambulatory Visit: Payer: Self-pay | Admitting: Family Medicine

## 2017-05-09 ENCOUNTER — Ambulatory Visit (INDEPENDENT_AMBULATORY_CARE_PROVIDER_SITE_OTHER): Payer: BLUE CROSS/BLUE SHIELD | Admitting: Family Medicine

## 2017-05-09 ENCOUNTER — Encounter: Payer: Self-pay | Admitting: Family Medicine

## 2017-05-09 VITALS — BP 142/88 | HR 113 | Temp 98.2°F | Ht 69.0 in | Wt 279.6 lb

## 2017-05-09 DIAGNOSIS — E1165 Type 2 diabetes mellitus with hyperglycemia: Secondary | ICD-10-CM | POA: Diagnosis not present

## 2017-05-09 DIAGNOSIS — IMO0001 Reserved for inherently not codable concepts without codable children: Secondary | ICD-10-CM

## 2017-05-09 DIAGNOSIS — I10 Essential (primary) hypertension: Secondary | ICD-10-CM | POA: Diagnosis not present

## 2017-05-09 LAB — POCT GLYCOSYLATED HEMOGLOBIN (HGB A1C): Hemoglobin A1C: 9.3

## 2017-05-09 NOTE — Assessment & Plan Note (Signed)
S: poorly controlled. On janumet max and glimepiride 65m Exercise and diet-  carb intake high. Exercise better- reports 5-6 days a week on treadmill walking 45 minutes. Weight unchanged.  Lab Results  Component Value Date   HGBA1C 9.3 05/09/2017   HGBA1C 9.1 (H) 01/31/2017   HGBA1C 7.8 (H) 10/25/2016   A/P: a1c up- declines new medication. He plans to cut out bread and work on portion control. Exercise excellent. If not 7.5 or less by follow up likely jardiance or farxiga

## 2017-05-09 NOTE — Patient Instructions (Signed)
I think a reasonable goal would be 10 lbs off in next 14 weeks.   Continue your exercise  Cut down on bread.   Work on portion control.   If a1c not 7.5 or less- lets very strongly consider jardiance or farxiga- wanted to give you the names so you could look into them if you would like.

## 2017-05-09 NOTE — Progress Notes (Signed)
Subjective:  Mark Frey is a 61 y.o. year old very pleasant male patient who presents for/with See problem oriented charting ROS- No chest pain or shortness of breath. No headache or blurry vision.    Past Medical History-  Patient Active Problem List   Diagnosis Date Noted  . Diabetes mellitus type II, uncontrolled ([place]) 06/05/2014    Priority: High  . Morbid obesity ([place]) 12/29/2009    Priority: High  . Hypertension 06/05/2014    Priority: Medium  . Irritable bowel syndrome 03/30/2010    Priority: Medium  . Hyperlipidemia 05/12/2007    Priority: Medium  . Dysmetabolic syndrome X 94/70/9628    Priority: Low  . BACK PAIN WITH RADICULOPATHY 06/09/2007    Priority: Low  . ALLERGIC RHINITIS 05/12/2007    Priority: Low  . Tachycardia 01/31/2017    Medications- reviewed and updated Current Outpatient Prescriptions  Medication Sig Dispense Refill  . aspirin 81 MG tablet Take 81 mg by mouth daily.    . cetirizine (ZYRTEC) 10 MG tablet Take 10 mg by mouth 2 (two) times daily as needed.      . Choline Fenofibrate 135 MG capsule TAKE 1 CAPSULE DAILY 90 capsule 1  . fish oil-omega-3 fatty acids 1000 MG capsule Take 1 g by mouth 3 (three) times daily.      . fluticasone (FLONASE) 50 MCG/ACT nasal spray 1 spray by Nasal route as needed.      [name] glimepiride (AMARYL) 4 MG tablet TAKE 1 TABLET DAILY BEFORE BREAKFAST 90 tablet 3  . glucosamine-chondroitin 500-400 MG tablet Take 1 tablet by mouth daily.      [name] glucose blood (FREESTYLE TEST STRIPS) test strip 1 each by Other route daily. Use as instructed     . JANUMET 50-1000 MG tablet TAKE 1 TABLET TWICE A DAY WITH MEALS 180 tablet 3  . Lancets (FREESTYLE) lancets 1 each by Other route as directed. Use as instructed     . Multiple Vitamin (MULTIVITAMIN) capsule Take 1 capsule by mouth daily.       No current facility-administered medications for this visit.     Objective: BP (!) 142/88   Pulse (!) 113   Temp 98.2 F (36.8 C) (Oral)    Ht 5' 9"  (1.753 m)   Wt 279 lb 9.6 oz (126.8 kg)   SpO2 97%   BMI 41.29 kg/m  Gen: NAD, resting comfortably CV: RRR no murmurs rubs or gallops Lungs: CTAB no crackles, wheeze, rhonchi Abdomen: soft/nontender. obese Ext: no edema Skin: warm, dry, no rash  Assessment/Plan:  Diabetes mellitus type II, uncontrolled ([place]) S: poorly controlled. On janumet max and glimepiride 53m Exercise and diet-  carb intake high. Exercise better- reports 5-6 days a week on treadmill walking 45 minutes. Weight unchanged.  Lab Results  Component Value Date   HGBA1C 9.3 05/09/2017   HGBA1C 9.1 (H) 01/31/2017   HGBA1C 7.8 (H) 10/25/2016   A/P: a1c up- declines new medication. He plans to cut out bread and work on portion control. Exercise excellent. If not 7.5 or less by follow up likely jardiance or farxiga  Hypertension S: controlled poorly. Thigh cuff BP Readings from Last 3 Encounters:  05/09/17 (!) 142/88  01/31/17 138/86  10/25/16 (!) 156/84  A/P: We discussed blood pressure goal of <140/90. Continue current meds:  Discussed dash eating plan and weight loss. Firmly declines meds  Morbid obesity ([place] S: weight stable- has not lost weight as intended. 2 comorbid conditions and BMI >35 and >  40 A/P: Encouraged need for healthy eating, regular exercise, weight loss.  Set a goal of 10 lbs in next 14 weeks.    Return in about 14 weeks (around 08/15/2017) for physical. come fasting.  Orders Placed This Encounter  Procedures  . POCT glycosylated hemoglobin (Hb A1C)   Return precautions advised.  Garret Reddish, MD  '

## 2017-05-09 NOTE — Assessment & Plan Note (Signed)
S: weight stable- has not lost weight as intended. 2 comorbid conditions and BMI >35 and >40 A/P: Encouraged need for healthy eating, regular exercise, weight loss.  Set a goal of 10 lbs in next 14 weeks.

## 2017-05-09 NOTE — Assessment & Plan Note (Signed)
S: controlled poorly. Thigh cuff BP Readings from Last 3 Encounters:  05/09/17 (!) 142/88  01/31/17 138/86  10/25/16 (!) 156/84  A/P: We discussed blood pressure goal of <140/90. Continue current meds:  Discussed dash eating plan and weight loss. Firmly declines meds

## 2017-08-02 ENCOUNTER — Other Ambulatory Visit: Payer: Self-pay | Admitting: Family Medicine

## 2017-08-02 MED ORDER — SITAGLIPTIN PHOS-METFORMIN HCL 50-1000 MG PO TABS: 1.0000 | ORAL_TABLET | Freq: Two times a day (BID) | ORAL | 3 refills | Status: DC

## 2017-08-02 MED ORDER — GLIMEPIRIDE 4 MG PO TABS: 4.0000 mg | ORAL_TABLET | Freq: Every day | ORAL | 3 refills | Status: DC

## 2017-08-02 MED ORDER — CHOLINE FENOFIBRATE 135 MG PO CPDR: 135.0000 mg | DELAYED_RELEASE_CAPSULE | Freq: Every day | ORAL | 3 refills | Status: DC

## 2017-08-02 NOTE — Telephone Encounter (Signed)
MEDICATION:   PHARMACY:    IS THIS A 90 DAY SUPPLY :   IS PATIENT OUT OF MEDICATION:   IF NOT; HOW MUCH IS LEFT:   LAST APPOINTMENT DATE: @Visit  date not found  NEXT APPOINTMENT DATE:@12 /17/2018  OTHER COMMENTS:    **Let patient know to contact pharmacy at the end of the day to make sure medication is ready. **  ** Please notify patient to allow 48-72 hours to process**  **Encourage patient to contact the pharmacy for refills or they can request refills through [facility]**

## 2017-08-02 NOTE — Telephone Encounter (Signed)
Pt notified Rx's were sent to [facility] mail order as requested. Pt verbalized understanding.

## 2017-08-02 NOTE — Telephone Encounter (Signed)
MEDICATION:  JANUMET 50-1000 MG tablet  Choline Fenofibrate 135 MG capsule  glimepiride (AMARYL) 4 MG tablet PHARMACY:     [facility] [place] [place] [facility] [employer] [facility] [phone] (Phone) [phone] (Fax)    IS THIS A 90 DAY SUPPLY : Y for all  IS PATIENT OUT OF MEDICATION:  N  IF NOT; HOW MUCH IS LEFT: N/A  LAST APPOINTMENT DATE: @Visit  date not found  NEXT APPOINTMENT DATE:@12 /17/2018  OTHER COMMENTS:    **Let patient know to contact pharmacy at the end of the day to make sure medication is ready. **  ** Please notify patient to allow 48-72 hours to process**  **Encourage patient to contact the pharmacy for refills or they can request refills through [facility]**

## 2017-08-29 ENCOUNTER — Ambulatory Visit (INDEPENDENT_AMBULATORY_CARE_PROVIDER_SITE_OTHER): Payer: BLUE CROSS/BLUE SHIELD | Admitting: Family Medicine

## 2017-08-29 ENCOUNTER — Encounter: Payer: Self-pay | Admitting: Family Medicine

## 2017-08-29 VITALS — BP 124/68 | HR 103 | Temp 97.4°F | Ht 69.0 in | Wt 271.8 lb

## 2017-08-29 DIAGNOSIS — E1165 Type 2 diabetes mellitus with hyperglycemia: Secondary | ICD-10-CM | POA: Diagnosis not present

## 2017-08-29 DIAGNOSIS — E782 Mixed hyperlipidemia: Secondary | ICD-10-CM | POA: Diagnosis not present

## 2017-08-29 DIAGNOSIS — Z125 Encounter for screening for malignant neoplasm of prostate: Secondary | ICD-10-CM

## 2017-08-29 DIAGNOSIS — Z0000 Encounter for general adult medical examination without abnormal findings: Secondary | ICD-10-CM | POA: Diagnosis not present

## 2017-08-29 DIAGNOSIS — I10 Essential (primary) hypertension: Secondary | ICD-10-CM

## 2017-08-29 LAB — CBC
HEMATOCRIT: 43.6 % (ref 39.0–52.0)
Hemoglobin: 14.4 g/dL (ref 13.0–17.0)
MCHC: 33.1 g/dL (ref 30.0–36.0)
MCV: 93.1 fl (ref 78.0–100.0)
Platelets: 295 10*3/uL (ref 150.0–400.0)
RBC: 4.68 Mil/uL (ref 4.22–5.81)
RDW: 13.8 % (ref 11.5–15.5)
WBC: 6.9 10*3/uL (ref 4.0–10.5)

## 2017-08-29 LAB — COMPREHENSIVE METABOLIC PANEL
ALBUMIN: 4.6 g/dL (ref 3.5–5.2)
ALT: 29 U/L (ref 0–53)
AST: 21 U/L (ref 0–37)
Alkaline Phosphatase: 32 U/L — ABNORMAL LOW (ref 39–117)
BUN: 18 mg/dL (ref 6–23)
CHLORIDE: 102 meq/L (ref 96–112)
CO2: 27 meq/L (ref 19–32)
Calcium: 9.6 mg/dL (ref 8.4–10.5)
Creatinine, Ser: 1.08 mg/dL (ref 0.40–1.50)
GFR: 73.85 mL/min (ref >60.00)
Glucose, Bld: 171 mg/dL — ABNORMAL HIGH (ref 70–99)
POTASSIUM: 4.5 meq/L (ref 3.5–5.1)
SODIUM: 138 meq/L (ref 135–145)
Total Bilirubin: 0.5 mg/dL (ref 0.2–1.2)
Total Protein: 7.4 g/dL (ref 6.0–8.3)

## 2017-08-29 LAB — LIPID PANEL
CHOL/HDL RATIO: 6
CHOLESTEROL: 180 mg/dL (ref 0–200)
HDL: 32 mg/dL — ABNORMAL LOW (ref >39.00)
LDL CALC: 112 mg/dL — AB (ref 0–99)
NonHDL: 147.97
TRIGLYCERIDES: 182 mg/dL — AB (ref 0.0–149.0)
VLDL: 36.4 mg/dL (ref 0.0–40.0)

## 2017-08-29 LAB — PSA: PSA: 0.87 ng/mL (ref 0.10–4.00)

## 2017-08-29 LAB — HEMOGLOBIN A1C: Hgb A1c MFr Bld: 7.3 % — ABNORMAL HIGH (ref 4.6–6.5)

## 2017-08-29 NOTE — Assessment & Plan Note (Addendum)
S: poorly controlled. On janumet max and glimepiride 29m last visit- he declined addign medicine but agreed to cut out bread and work on portion control. Had discussed farxiga or jardiance if a1c not below 7.5. Lost 8 lbs since last visit- around 270 at home- cut out most breads (down from 2 sandwiches at a time)- has really watched carbs. Has done sometime on treadmill- last 25 days has rested due to his knee bothering him (now better). Still suspect hyperglycemia Lab Results  Component Value Date   HGBA1C 9.3 05/09/2017   HGBA1C 9.1 (H) 01/31/2017   HGBA1C 7.8 (H) 10/25/2016   A/P: update a1c. As long as trending down wants to work on diet/ weight loss- he declines jardiance/farxiga option- would push this only if a1c trending up or stbale - agreeds to diabetic eye exam- will get this set up.

## 2017-08-29 NOTE — Assessment & Plan Note (Signed)
Obesity- had set goal in august of 10 lbs off in 14 week (very close with 8 lbs off). Weight last visit was 279. Set goal of another 10 lbs in 4 months. BMI nearing 40 at least with these changes.  Wt Readings from Last 3 Encounters:  08/29/17 271 lb 12.8 oz (123.3 kg)  05/09/17 279 lb 9.6 oz (126.8 kg)  01/31/17 279 lb 9.6 oz (126.8 kg)

## 2017-08-29 NOTE — Progress Notes (Signed)
Phone: [phone]  Subjective:  Patient presents today for their annual physical. Chief complaint-noted.   See problem oriented charting- ROS- full  review of systems was completed and negative including No chest pain or shortness of breath. No headache or blurry vision.  Denies hypoglycemia  The following were reviewed and entered/updated in epic: Past Medical History:  Diagnosis Date  . ALLERGIC RHINITIS   . Diabetes mellitus   . Hyperlipidemia    Patient Active Problem List   Diagnosis Date Noted  . Diabetes mellitus type II, uncontrolled ([place]) 06/05/2014    Priority: High  . Morbid obesity ([place]) 12/29/2009    Priority: High  . Hypertension 06/05/2014    Priority: Medium  . Irritable bowel syndrome 03/30/2010    Priority: Medium  . Hyperlipidemia 05/12/2007    Priority: Medium  . Dysmetabolic syndrome X 09/32/3557    Priority: Low  . BACK PAIN WITH RADICULOPATHY 06/09/2007    Priority: Low  . ALLERGIC RHINITIS 05/12/2007    Priority: Low  . Tachycardia 01/31/2017   Past Surgical History:  Procedure Laterality Date  . CLEFT PALATE REPAIR    . deviated septum repair     slight improvement  . TONSILLECTOMY      Family History  Problem Relation Age of Onset  . AAA (abdominal aortic aneurysm) Father        smoker  . Lung cancer Mother        smoker  . Brain cancer Mother        metastasis    Medications- reviewed and updated Current Outpatient Medications  Medication Sig Dispense Refill  . aspirin 81 MG tablet Take 81 mg by mouth daily.    . cetirizine (ZYRTEC) 10 MG tablet Take 10 mg by mouth 2 (two) times daily as needed.      . Choline Fenofibrate 135 MG capsule Take 1 capsule (135 mg total) by mouth daily. 90 capsule 3  . fish oil-omega-3 fatty acids 1000 MG capsule Take 1 g by mouth 3 (three) times daily.      . fluticasone (FLONASE) 50 MCG/ACT nasal spray 1 spray by Nasal route as needed.      [name] glimepiride (AMARYL) 4 MG tablet Take 1 tablet (4 mg  total) by mouth daily before breakfast. 90 tablet 3  . glucosamine-chondroitin 500-400 MG tablet Take 1 tablet by mouth daily.      [name] glucose blood (FREESTYLE TEST STRIPS) test strip 1 each by Other route daily. Use as instructed     . Lancets (FREESTYLE) lancets 1 each by Other route as directed. Use as instructed     . Multiple Vitamin (MULTIVITAMIN) capsule Take 1 capsule by mouth daily.      . sitaGLIPtin-metformin (JANUMET) 50-1000 MG tablet Take 1 tablet by mouth 2 (two) times daily with a meal. 180 tablet 3   No current facility-administered medications for this visit.     Allergies-reviewed and updated No Known Allergies  Social History   Socioeconomic History  . Marital status: Married    Spouse name: None  . Number of children: None  . Years of education: None  . Highest education level: None  Social Needs  . Financial resource strain: None  . Food insecurity - worry: None  . Food insecurity - inability: None  . Transportation needs - medical: None  . Transportation needs - non-medical: None  Occupational History  . None  Tobacco Use  . Smoking status: Never Smoker  . Smokeless tobacco: Never Used  Substance and Sexual Activity  . Alcohol use: Yes    Comment: occasional  . Drug use: No  . Sexual activity: Yes  Other Topics Concern  . None  Social History Narrative   Married 1985. No kids. 4 small dogs.       Works in Financial trader, residential      Hobbies: work on cars, Haematologist, exercise as able    Objective: BP 124/68 (BP Location: Left Arm, Patient Position: Sitting, Cuff Size: Large)   Pulse (!) 103   Temp (!) 97.4 F (36.3 C) (Oral)   Ht 5' 9"  (1.753 m)   Wt 271 lb 12.8 oz (123.3 kg)   SpO2 95%   BMI 40.14 kg/m  Gen: NAD, resting comfortably HEENT: Mucous membranes are moist. Oropharynx normal Neck: no thyromegaly CV: RRR no murmurs rubs or gallops Lungs: CTAB no crackles, wheeze, rhonchi Abdomen:  soft/nontender/nondistended/normal bowel sounds. No rebound or guarding.  Ext: no edema Skin: warm, dry Neuro: grossly normal, moves all extremities, PERRLA Rectal: normal tone, normal sized prostate, no masses or tenderness  Assessment/Plan:  61 y.o. male presenting for annual physical.  Health Maintenance counseling: 1. Anticipatory guidance: Patient counseled regarding regular dental exams -q6 months, eye exams - close to yearly, wearing seatbelts.  2. Risk factor reduction:  Advised patient of need for regular exercise and diet rich and fruits and vegetables to reduce risk of heart attack and stroke. Exercise- had been on treadmill- about to start back as knee feeling better. Diet-has cut carbs- see DRm portion.  Wt Readings from Last 3 Encounters:  08/29/17 271 lb 12.8 oz (123.3 kg)  05/09/17 279 lb 9.6 oz (126.8 kg)  01/31/17 279 lb 9.6 oz (126.8 kg)  3. Immunizations/screenings/ancillary studies. He wants shingrix but we have availability issues. Declines pneumovax until 65. Declines Td. Declines influenza.  Immunization History  Administered Date(s) Administered  . Td 09/17/1998   4. Prostate cancer screening-  update PSA. Low risk rectal Lab Results  Component Value Date   PSA 0.72 10/21/2015   PSA 0.94 06/05/2014   PSA 0.80 05/02/2013   5. Colon cancer screening -  discussed colonoscopy, cologuard, stool cards (even though already doing rectal- would not let me do hemocult)- firm no without reasoning. He states "no means no" 6. Skin cancer screening- advised regular sunscreen use. Denies worrisome, changing, or new skin lesions.   Status of chronic or acute concerns   Diabetes mellitus type II, uncontrolled ([place]) S: poorly controlled. On janumet max and glimepiride 34m last visit- he declined addign medicine but agreed to cut out bread and work on portion control. Had discussed farxiga or jardiance if a1c not below 7.5. Lost 8 lbs since last visit- around 270 at home- cut  out most breads (down from 2 sandwiches at a time)- has really watched carbs. Has done sometime on treadmill- last 25 days has rested due to his knee bothering him (now better). Still suspect hyperglycemia Lab Results  Component Value Date   HGBA1C 9.3 05/09/2017   HGBA1C 9.1 (H) 01/31/2017   HGBA1C 7.8 (H) 10/25/2016   A/P: update a1c. As long as trending down wants to work on diet/ weight loss- he declines jardiance/farxiga option- would push this only if a1c trending up or stbale - agreeds to diabetic eye exam- will get this set up.   Hypertension HTN- poor control last visit on no medicines. Was to work on weight loss- much better with weight loss. He has declined medication.  BP Readings  from Last 3 Encounters:  08/29/17 124/68  05/09/17 (!) 142/88  01/31/17 138/86     Hyperlipidemia HLD- refuses statin despite being diabetic. Is on fish oil, fenofibric acid. Will look into red yeast rice- and consider  Morbid obesity ([place]) Obesity- had set goal in august of 10 lbs off in 14 week (very close with 8 lbs off). Weight last visit was 279. Set goal of another 10 lbs in 4 months. BMI nearing 40 at least with these changes.  Wt Readings from Last 3 Encounters:  08/29/17 271 lb 12.8 oz (123.3 kg)  05/09/17 279 lb 9.6 oz (126.8 kg)  01/31/17 279 lb 9.6 oz (126.8 kg)     Return in about 4 months (around 12/28/2017) for follow up- or sooner if needed.  Orders Placed This Encounter  Procedures  . CBC    [place]  . Comprehensive metabolic panel    [place]    Order Specific Question:   Has the patient fasted?    Answer:   No  . Lipid panel    [place]    Order Specific Question:   Has the patient fasted?    Answer:   No  . PSA  . Hemoglobin A1c    [place]   Return precautions advised.  Garret Reddish, MD

## 2017-08-29 NOTE — Patient Instructions (Addendum)
Consider red yeast rice for cholesterol  Please stop by lab before you go

## 2017-08-29 NOTE — Assessment & Plan Note (Addendum)
HLD- refuses statin despite being diabetic. Is on fish oil, fenofibric acid. Will look into red yeast rice- and consider

## 2017-08-29 NOTE — Assessment & Plan Note (Signed)
HTN- poor control last visit on no medicines. Was to work on weight loss- much better with weight loss. He has declined medication.  BP Readings from Last 3 Encounters:  08/29/17 124/68  05/09/17 (!) 142/88  01/31/17 138/86

## 2017-11-29 ENCOUNTER — Other Ambulatory Visit: Payer: Self-pay | Admitting: Family Medicine

## 2017-12-28 ENCOUNTER — Ambulatory Visit (INDEPENDENT_AMBULATORY_CARE_PROVIDER_SITE_OTHER): Payer: BLUE CROSS/BLUE SHIELD | Admitting: Family Medicine

## 2017-12-28 ENCOUNTER — Encounter: Payer: Self-pay | Admitting: Family Medicine

## 2017-12-28 VITALS — BP 132/86 | HR 103 | Temp 97.7°F | Ht 69.0 in | Wt 261.0 lb

## 2017-12-28 DIAGNOSIS — R000 Tachycardia, unspecified: Secondary | ICD-10-CM

## 2017-12-28 DIAGNOSIS — E785 Hyperlipidemia, unspecified: Secondary | ICD-10-CM

## 2017-12-28 DIAGNOSIS — E119 Type 2 diabetes mellitus without complications: Secondary | ICD-10-CM | POA: Diagnosis not present

## 2017-12-28 DIAGNOSIS — I10 Essential (primary) hypertension: Secondary | ICD-10-CM | POA: Diagnosis not present

## 2017-12-28 DIAGNOSIS — Z23 Encounter for immunization: Secondary | ICD-10-CM | POA: Diagnosis not present

## 2017-12-28 LAB — POCT GLYCOSYLATED HEMOGLOBIN (HGB A1C): HEMOGLOBIN A1C: 6.6

## 2017-12-28 MED ORDER — GLIMEPIRIDE 2 MG PO TABS: 4.0000 mg | ORAL_TABLET | Freq: Every day | ORAL | 3 refills | Status: DC

## 2017-12-28 MED ORDER — GLIMEPIRIDE 2 MG PO TABS: 2.0000 mg | ORAL_TABLET | Freq: Every day | ORAL | 3 refills | Status: DC

## 2017-12-28 NOTE — Progress Notes (Signed)
Subjective:  Mark Frey is a 62 y.o. year old very pleasant male patient who presents for/with See problem oriented charting ROS-no hypoglycemia or chest pain.  No shortness of breath.  No increased edema.  Has had intentional weight loss  Past Medical History-  Patient Active Problem List   Diagnosis Date Noted  . Diabetes mellitus type II, uncontrolled ([place]) 06/05/2014    Priority: High  . Morbid obesity ([place]) 12/29/2009    Priority: High  . Hypertension 06/05/2014    Priority: Medium  . Irritable bowel syndrome 03/30/2010    Priority: Medium  . Hyperlipidemia 05/12/2007    Priority: Medium  . Dysmetabolic syndrome X 09/32/3557    Priority: Low  . BACK PAIN WITH RADICULOPATHY 06/09/2007    Priority: Low  . ALLERGIC RHINITIS 05/12/2007    Priority: Low  . Tachycardia 01/31/2017    Medications- reviewed and updated Current Outpatient Medications  Medication Sig Dispense Refill  . aspirin 81 MG tablet Take 81 mg by mouth daily.    . cetirizine (ZYRTEC) 10 MG tablet Take 10 mg by mouth 2 (two) times daily as needed.      . Choline Fenofibrate 135 MG capsule Take 1 capsule (135 mg total) by mouth daily. 90 capsule 3  . Choline Fenofibrate 135 MG capsule TAKE 1 CAPSULE DAILY 90 capsule 1  . fish oil-omega-3 fatty acids 1000 MG capsule Take 1 g by mouth 3 (three) times daily.      . fluticasone (FLONASE) 50 MCG/ACT nasal spray 1 spray by Nasal route as needed.      [name] glimepiride (AMARYL) 4 MG tablet Take 1 tablet (4 mg total) by mouth daily before breakfast. 90 tablet 3  . glimepiride (AMARYL) 4 MG tablet TAKE 1 TABLET DAILY BEFORE BREAKFAST 90 tablet 3  . glucosamine-chondroitin 500-400 MG tablet Take 1 tablet by mouth daily.      [name] glucose blood (FREESTYLE TEST STRIPS) test strip 1 each by Other route daily. Use as instructed     . JANUMET 50-1000 MG tablet TAKE 1 TABLET TWICE DAILY  WITH MEALS 180 tablet 3  . Lancets (FREESTYLE) lancets 1 each by Other route as directed.  Use as instructed     . Multiple Vitamin (MULTIVITAMIN) capsule Take 1 capsule by mouth daily.      . sitaGLIPtin-metformin (JANUMET) 50-1000 MG tablet Take 1 tablet by mouth 2 (two) times daily with a meal. 180 tablet 3   Objective: BP 132/86 (BP Location: Left Arm, Patient Position: Sitting, Cuff Size: Large)   Pulse (!) 103   Temp 97.7 F (36.5 C) (Oral)   Ht 5' 9"  (1.753 m)   Wt 261 lb (118.4 kg)   SpO2 97%   BMI 38.54 kg/m  Gen: NAD, resting comfortably CV: regular rhythm- slight tachycardia.  no murmurs rubs or gallops Lungs: CTAB no crackles, wheeze, rhonchi Abdomen: soft/nontender/nondistended/normal bowel sounds.obese but improved!  Ext: trace edema Skin: warm, dry, 3 slightly erythematous patches under 1 x 1 cm areas without bullseye at sites of prior tick bites Neuro: normal speech pattern Assessment/Plan:  Other Notes: 1. 3 tick bites-these appear to be healing well.  No signs of [place] spotted fever or Lyme disease.  We discussed follow-up precautions 2.  He continues to decline any prostate cancer screening whatsoever-this is disappointing.    Tachycardia Regular rhythm but tachycardic on exam.  I discussed with patient role of EKG to detect underlying rhythm.  I doubt something like atrial fibrillation but  it is possible.  He feels well and is asymptomatic.  He declined EKG for further evaluation  Diabetes mellitus type II, uncontrolled ([place]) S: poorly controlled last visit but improving On janumet max dose and glimepiride 69m.   Since that time-weight down another 10 lbs since last visit! Last visit had really cut down on carbs and that continues to help. Not much exercise but really tightened diet- low carbs and lower portions.  Lab Results  Component Value Date   HGBA1C 6.6 12/28/2017   HGBA1C 7.3 (H) 08/29/2017   HGBA1C 9.3 05/09/2017   A/P:  thrilled a1c under 7.  We will cut glimepiride to 2 mg.  He will continue weight loss efforts.  His A1c remains  6.6 or lower at next visit we will stop the glimepiride.  If he does well off the glimepiride-we discussed stopping the Januvia portion of his Janumet several visits from now   Hypertension S: controlled on no rx once again.  Weight loss and exercise really seem to be helping A/P: We discussed blood pressure goal of <140/90. Continue current meds  Hyperlipidemia S: refuses statin even once a week. Remains on fish oil and fenofibric acid.  A/P: He was to look into red yeast rice- has mail order herb place- may try this.  I still strongly prefer once a week statin but he is simply unwilling even to trial   Future Appointments  Date Time Provider [place] 03/30/2018 10:00 AM HMarin Olp MD [facility]   Return in about 3 months (around 04/05/2018) for follow up- or sooner if needed.  Lab/Order associations: Need for prophylactic vaccination and inoculation against varicella - Plan: Varicella-zoster vaccine IM (Shingrix)  Uncontrolled type 2 diabetes mellitus with hyperglycemia ([place] - Plan: POCT glycosylated hemoglobin (Hb A1C)   Meds ordered this encounter  Medications  . DISCONTD: glimepiride (AMARYL) 2 MG tablet    Sig: Take 2 tablets (4 mg total) by mouth daily before breakfast.    Dispense:  90 tablet    Refill:  3  . glimepiride (AMARYL) 2 MG tablet    Sig: Take 1 tablet (2 mg total) by mouth daily before breakfast.    Dispense:  90 tablet    Refill:  3    Reducing dose to 239mtotal today. Please disregard 4 mg dose sent today.    Return precautions advised.  StGarret ReddishMD

## 2017-12-28 NOTE — Patient Instructions (Addendum)
Health Maintenance Due  Topic Date Due  . COLONOSCOPY-Patient stated he doesn't want one . Please please consider stool card at physical at least 07/15/2006  . TETANUS/TDAP-Patient Declined 09/17/2008  . OPHTHALMOLOGY EXAM-Patient will schedule it for June. 03/23/2016   a1c looks great! Lets reduce glimepiride to half dose so 2 mg (cut current pill in half until you run out then go to smaller tablet).   If a1c 6.6 or less next time we will stop glimepiride completely. Potentially stop [place] the next visit after that if a1c remains 6.6 or less.   Blood pressure looks great  Consider the red yeast rice option

## 2017-12-30 NOTE — Assessment & Plan Note (Signed)
S: controlled on no rx once again.  Weight loss and exercise really seem to be helping A/P: We discussed blood pressure goal of <140/90. Continue current meds

## 2017-12-30 NOTE — Assessment & Plan Note (Signed)
Regular rhythm but tachycardic on exam.  I discussed with patient role of EKG to detect underlying rhythm.  I doubt something like atrial fibrillation but it is possible.  He feels well and is asymptomatic.  He declined EKG for further evaluation

## 2017-12-30 NOTE — Assessment & Plan Note (Signed)
S: poorly controlled last visit but improving On janumet max dose and glimepiride 88m.   Since that time-weight down another 10 lbs since last visit! Last visit had really cut down on carbs and that continues to help. Not much exercise but really tightened diet- low carbs and lower portions.  Lab Results  Component Value Date   HGBA1C 6.6 12/28/2017   HGBA1C 7.3 (H) 08/29/2017   HGBA1C 9.3 05/09/2017   A/P:  thrilled a1c under 7.  We will cut glimepiride to 2 mg.  He will continue weight loss efforts.  His A1c remains 6.6 or lower at next visit we will stop the glimepiride.  If he does well off the glimepiride-we discussed stopping the Januvia portion of his Janumet several visits from now

## 2017-12-30 NOTE — Assessment & Plan Note (Signed)
S: refuses statin even once a week. Remains on fish oil and fenofibric acid.  A/P: He was to look into red yeast rice- has mail order herb place- may try this.  I still strongly prefer once a week statin but he is simply unwilling even to trial

## 2018-01-04 NOTE — Progress Notes (Signed)
LVM for patient.  CRM in place.

## 2018-01-30 ENCOUNTER — Encounter: Payer: Self-pay | Admitting: Family Medicine

## 2018-01-30 ENCOUNTER — Ambulatory Visit (INDEPENDENT_AMBULATORY_CARE_PROVIDER_SITE_OTHER): Payer: BLUE CROSS/BLUE SHIELD | Admitting: Family Medicine

## 2018-01-30 VITALS — BP 126/72 | HR 97 | Temp 98.3°F | Ht 69.0 in | Wt 256.8 lb

## 2018-01-30 DIAGNOSIS — R300 Dysuria: Secondary | ICD-10-CM

## 2018-01-30 LAB — POC URINALSYSI DIPSTICK (AUTOMATED)
Glucose, UA: NEGATIVE
Leukocytes, UA: NEGATIVE
Nitrite, UA: NEGATIVE
PH UA: 5 (ref 5.0–8.0)
PROTEIN UA: POSITIVE — AB
RBC UA: NEGATIVE
UROBILINOGEN UA: 2 U/dL — AB

## 2018-01-30 NOTE — Progress Notes (Signed)
Subjective:  Mark Frey is a 62 y.o. year old very pleasant male patient who presents for/with See problem oriented charting ROS- has had some fevers and some rectal pain- better with medication, admits to some fatigue, some frequent urination   Past Medical History-  Patient Active Problem List   Diagnosis Date Noted  . Controlled diabetes mellitus type II without complication ([place]) 02/58/5277    Priority: High  . Morbid obesity ([place]) 12/29/2009    Priority: High  . Tachycardia 01/31/2017    Priority: Medium  . Hypertension 06/05/2014    Priority: Medium  . Irritable bowel syndrome 03/30/2010    Priority: Medium  . Hyperlipidemia 05/12/2007    Priority: Medium  . Dysmetabolic syndrome X 82/42/3536    Priority: Low  . BACK PAIN WITH RADICULOPATHY 06/09/2007    Priority: Low  . ALLERGIC RHINITIS 05/12/2007    Priority: Low    Medications- reviewed and updated Current Outpatient Medications  Medication Sig Dispense Refill  . aspirin 81 MG tablet Take 81 mg by mouth daily.    . cetirizine (ZYRTEC) 10 MG tablet Take 10 mg by mouth 2 (two) times daily as needed.      . Choline Fenofibrate 135 MG capsule Take 1 capsule (135 mg total) by mouth daily. 90 capsule 3  . Choline Fenofibrate 135 MG capsule TAKE 1 CAPSULE DAILY 90 capsule 1  . fish oil-omega-3 fatty acids 1000 MG capsule Take 1 g by mouth 3 (three) times daily.      . fluticasone (FLONASE) 50 MCG/ACT nasal spray 1 spray by Nasal route as needed.      [name] glimepiride (AMARYL) 2 MG tablet Take 1 tablet (2 mg total) by mouth daily before breakfast. 90 tablet 3  . glucosamine-chondroitin 500-400 MG tablet Take 1 tablet by mouth daily.      [name] glucose blood (FREESTYLE TEST STRIPS) test strip 1 each by Other route daily. Use as instructed     . JANUMET 50-1000 MG tablet TAKE 1 TABLET TWICE DAILY  WITH MEALS 180 tablet 3  . Lancets (FREESTYLE) lancets 1 each by Other route as directed. Use as instructed     . Multiple Vitamin  (MULTIVITAMIN) capsule Take 1 capsule by mouth daily.      . sitaGLIPtin-metformin (JANUMET) 50-1000 MG tablet Take 1 tablet by mouth 2 (two) times daily with a meal. 180 tablet 3   No current facility-administered medications for this visit.     Objective: BP 126/72 (BP Location: Left Arm, Patient Position: Sitting, Cuff Size: Normal)   Pulse 97   Temp 98.3 F (36.8 C) (Oral)   Ht 5' 9"  (1.753 m)   Wt 256 lb 12.8 oz (116.5 kg)   SpO2 95%   BMI 37.92 kg/m  Gen: NAD, resting comfortably, well appearing and afebrile CV: RRR no murmurs rubs or gallops Lungs: CTAB no crackles, wheeze, rhonchi Abdomen: soft/nontender/nondistended/normal bowel sounds. No rebound or guarding.  Ext: no edema Skin: warm, dry Rectal: normal tone, normal sized prostate, no masses or tenderness  Assessment/Plan:   Dysuria - Plan: POCT Urinalysis Dipstick (Automated), Gram stain, Urine Culture S: burning with urination. Has had some fever up to 102. No tick bites since early april. Had some chills. Feels run down. Lower abdominal and rectal discomfort mild. Very mild relief with bowel movement. advil helps pain and the fever.   Had 13 years ago and that was far more severe than this.   Tingling in legs on ciprofloxacin down to  the toe A/P: Rectal exam without signs of prostatitis-normal tone, no pain with palpation.  Could still be urinary tract infection but urinalysis not very concerning.  We will get a urine culture as well as urine Gram stain be on the safe side.  Tick bite well over a month ago-doubt tickborne disease.  No prolonged attachment as far as he knows either.  If work-up unrevealing and symptoms persist could sit consider urology referral or at least a repeat rectal exam in a week or 2. Would consider bloodwork if symptoms persist and could consider CT pelvis potentially.   Future Appointments  Date Time Provider [place]  03/30/2018 10:00 AM [name], MD [facility]    Lab/Order associations: Dysuria - Plan: POCT Urinalysis Dipstick (Automated), Gram stain, Urine Culture  Return precautions advised.  Garret Reddish, MD

## 2018-01-30 NOTE — Patient Instructions (Addendum)
Health Maintenance Due  Topic Date Due  . COLONOSCOPY - Patient Declined 07/15/2006  . TETANUS/TDAP - Patient Declined 09/17/2008  . OPHTHALMOLOGY EXAM - Patient will schedule after the allergy season is over 03/23/2016   Prostate is nice and smooth today and not tender on exam. Hopefully this is just a urinary tract infection. I will give you an update tomorrow at the latest on initial urine results- if initial test doesn't show infection we will get a culture. If culture doesn't show any infection likely need to see you back to reevaluate and draw bloodwork  Water in the hall to help you urinate (hopefully can pee within next hour)

## 2018-01-31 ENCOUNTER — Telehealth: Payer: Self-pay | Admitting: Family Medicine

## 2018-01-31 DIAGNOSIS — R300 Dysuria: Secondary | ICD-10-CM | POA: Diagnosis not present

## 2018-01-31 LAB — GRAM STAIN

## 2018-01-31 LAB — EXTRA URINE SPECIMEN

## 2018-01-31 NOTE — Telephone Encounter (Signed)
Copied from [place] [id number]. Topic: Quick Communication - Lab Results >> Jan 31, 2018  2:31 PM Dark, Angelena Sole, CMA wrote: Called patient to inform them of 01/30/2018 lab results. When patient returns call, triage nurse may disclose results. [phone]

## 2018-01-31 NOTE — Addendum Note (Signed)
Addended by: Kayren Eaves T on: 01/31/2018 09:19 AM   Modules accepted: Orders

## 2018-02-01 LAB — URINE CULTURE
MICRO NUMBER:: 90610559
SPECIMEN QUALITY:: ADEQUATE

## 2018-02-01 NOTE — Telephone Encounter (Signed)
Patient notified by office today

## 2018-02-02 LAB — GRAM STAIN
MICRO NUMBER:: 90616591
SPECIMEN QUALITY: ADEQUATE

## 2018-03-09 ENCOUNTER — Ambulatory Visit (INDEPENDENT_AMBULATORY_CARE_PROVIDER_SITE_OTHER): Payer: BLUE CROSS/BLUE SHIELD | Admitting: Family Medicine

## 2018-03-09 ENCOUNTER — Encounter: Payer: Self-pay | Admitting: Family Medicine

## 2018-03-09 ENCOUNTER — Ambulatory Visit: Payer: BLUE CROSS/BLUE SHIELD | Admitting: Family Medicine

## 2018-03-09 VITALS — BP 132/78 | HR 120 | Temp 98.7°F | Ht 69.0 in | Wt 260.6 lb

## 2018-03-09 DIAGNOSIS — R000 Tachycardia, unspecified: Secondary | ICD-10-CM | POA: Diagnosis not present

## 2018-03-09 DIAGNOSIS — R0902 Hypoxemia: Secondary | ICD-10-CM

## 2018-03-09 DIAGNOSIS — M7989 Other specified soft tissue disorders: Secondary | ICD-10-CM

## 2018-03-09 NOTE — Progress Notes (Signed)
   Subjective:  Mark Frey is a 62 y.o. male who presents today with a chief complaint of right leg edema.   HPI:  Right Leg Edema, acute problem Started about 10 days ago. Located from hip through foot on his right side. Patient was doing work under the house a few days to the symptoms starting, but denies any obvious precipitating events. He is taking ibuprofen which has helped some. Tries to keep leg elevated also. No chest pain or shortness of breath. No other obvious alleviating or aggravating factors.  72 45   ROS: Per HPI  PMH: He reports that he has never smoked. He has never used smokeless tobacco. He reports that he drinks alcohol. He reports that he does not use drugs.  Objective:  Physical Exam: BP 132/78 (BP Location: Left Arm, Patient Position: Sitting, Cuff Size: Normal)   Pulse (!) 120   Temp 98.7 F (37.1 C) (Oral)   Ht 5' 9"  (1.753 m)   Wt 260 lb 9.6 oz (118.2 kg)   SpO2 97%   BMI 38.48 kg/m   Gen: NAD, resting comfortably CV: RRR with no murmurs appreciated Pulm: NWOB, CTAB with no crackles, wheezes, or rhonchi GI: Normal bowel sounds present. Soft, Nontender, Nondistended. MSK:  -RLE: Grossly edematous from hip through foot. Approximately 53cm in diameter across midcalf. -LLE: No deformities. 45cm in diamer across midcalf.  Skin: Warm, dry Neuro: Grossly normal, moves all extremities Psych: Normal affect and thought content  Assessment/Plan:  Right Leg Edema My concern for DVT and possible PE.  Patient's O2 sat dropped to 90% and heart rate increased to 137 while ambulating.  Discussed treatment options with patient including transfer to the ED for further evaluation for DVT/PE, however patient deferred.  We will start Xarelto 15 mg twice daily while in the office until DVT/PE is ruled out.  We will check lower extremity Doppler and possible CTA depending on results of lower extremity Doppler. Discussed possible side effects of Xarelto. Discussed strict  reasons to return to care and seek emergent care.   Algis Greenhouse. Jerline Pain, MD 03/09/2018 4:17 PM

## 2018-03-09 NOTE — Patient Instructions (Addendum)
It was very nice to see you today!  I am concerned that you have a blood clot in your leg.  We will start pain medication today called Xarelto.  This is a blood thinner medication that we will treat blood clots.  I cannot definitively say that you do or do not have a blood clot until we check further tests.  Please take 15 mg twice daily with food.  If you have a blood clot based on your tests, we will send in a new prescription for you  Please avoid taking any ibuprofen, aspirin, or other NSAIDs while on xarelto.  If you have any sort of chest pain, shortness of breath, or any other worsening symptoms, please seek medical care.  Take care, Dr Jerline Pain

## 2018-03-10 ENCOUNTER — Encounter: Payer: Self-pay | Admitting: Family Medicine

## 2018-03-10 ENCOUNTER — Inpatient Hospital Stay (HOSPITAL_COMMUNITY)
Admission: EM | Admit: 2018-03-10 | Discharge: 2018-03-25 | DRG: 176 | Disposition: A | Discharge status: Home / Self Care | Payer: BLUE CROSS/BLUE SHIELD | Attending: Internal Medicine | Admitting: Internal Medicine

## 2018-03-10 ENCOUNTER — Encounter (HOSPITAL_COMMUNITY): Payer: Self-pay

## 2018-03-10 ENCOUNTER — Emergency Department (HOSPITAL_COMMUNITY): Payer: BLUE CROSS/BLUE SHIELD

## 2018-03-10 ENCOUNTER — Other Ambulatory Visit: Payer: Self-pay

## 2018-03-10 ENCOUNTER — Ambulatory Visit (HOSPITAL_BASED_OUTPATIENT_CLINIC_OR_DEPARTMENT_OTHER)
Admission: RE | Admit: 2018-03-10 | Discharge: 2018-03-10 | Disposition: A | Discharge status: Home / Self Care | Payer: BLUE CROSS/BLUE SHIELD | Source: Ambulatory Visit | Attending: Family Medicine | Admitting: Family Medicine

## 2018-03-10 DIAGNOSIS — Z6839 Body mass index (BMI) 39.0-39.9, adult: Secondary | ICD-10-CM

## 2018-03-10 DIAGNOSIS — Z7984 Long term (current) use of oral hypoglycemic drugs: Secondary | ICD-10-CM | POA: Diagnosis not present

## 2018-03-10 DIAGNOSIS — R000 Tachycardia, unspecified: Secondary | ICD-10-CM | POA: Diagnosis not present

## 2018-03-10 DIAGNOSIS — M7989 Other specified soft tissue disorders: Secondary | ICD-10-CM | POA: Diagnosis not present

## 2018-03-10 DIAGNOSIS — E7800 Pure hypercholesterolemia, unspecified: Secondary | ICD-10-CM | POA: Diagnosis present

## 2018-03-10 DIAGNOSIS — K56600 Partial intestinal obstruction, unspecified as to cause: Secondary | ICD-10-CM | POA: Diagnosis present

## 2018-03-10 DIAGNOSIS — E790 Hyperuricemia without signs of inflammatory arthritis and tophaceous disease: Secondary | ICD-10-CM | POA: Diagnosis not present

## 2018-03-10 DIAGNOSIS — N200 Calculus of kidney: Secondary | ICD-10-CM | POA: Diagnosis not present

## 2018-03-10 DIAGNOSIS — I82411 Acute embolism and thrombosis of right femoral vein: Secondary | ICD-10-CM | POA: Diagnosis present

## 2018-03-10 DIAGNOSIS — I10 Essential (primary) hypertension: Secondary | ICD-10-CM | POA: Diagnosis not present

## 2018-03-10 DIAGNOSIS — Z7982 Long term (current) use of aspirin: Secondary | ICD-10-CM

## 2018-03-10 DIAGNOSIS — K589 Irritable bowel syndrome without diarrhea: Secondary | ICD-10-CM | POA: Diagnosis present

## 2018-03-10 DIAGNOSIS — N5089 Other specified disorders of the male genital organs: Secondary | ICD-10-CM | POA: Diagnosis present

## 2018-03-10 DIAGNOSIS — Z87730 Personal history of (corrected) cleft lip and palate: Secondary | ICD-10-CM

## 2018-03-10 DIAGNOSIS — Z5111 Encounter for antineoplastic chemotherapy: Secondary | ICD-10-CM | POA: Diagnosis not present

## 2018-03-10 DIAGNOSIS — R1903 Right lower quadrant abdominal swelling, mass and lump: Secondary | ICD-10-CM | POA: Diagnosis not present

## 2018-03-10 DIAGNOSIS — D62 Acute posthemorrhagic anemia: Secondary | ICD-10-CM | POA: Diagnosis present

## 2018-03-10 DIAGNOSIS — R195 Other fecal abnormalities: Secondary | ICD-10-CM | POA: Diagnosis not present

## 2018-03-10 DIAGNOSIS — D649 Anemia, unspecified: Secondary | ICD-10-CM | POA: Diagnosis not present

## 2018-03-10 DIAGNOSIS — I700 Atherosclerosis of aorta: Secondary | ICD-10-CM | POA: Diagnosis present

## 2018-03-10 DIAGNOSIS — Z801 Family history of malignant neoplasm of trachea, bronchus and lung: Secondary | ICD-10-CM | POA: Diagnosis not present

## 2018-03-10 DIAGNOSIS — D6959 Other secondary thrombocytopenia: Secondary | ICD-10-CM | POA: Diagnosis not present

## 2018-03-10 DIAGNOSIS — Z808 Family history of malignant neoplasm of other organs or systems: Secondary | ICD-10-CM | POA: Diagnosis not present

## 2018-03-10 DIAGNOSIS — N433 Hydrocele, unspecified: Secondary | ICD-10-CM | POA: Diagnosis present

## 2018-03-10 DIAGNOSIS — C8513 Unspecified B-cell lymphoma, intra-abdominal lymph nodes: Secondary | ICD-10-CM | POA: Diagnosis not present

## 2018-03-10 DIAGNOSIS — I82441 Acute embolism and thrombosis of right tibial vein: Secondary | ICD-10-CM

## 2018-03-10 DIAGNOSIS — E785 Hyperlipidemia, unspecified: Secondary | ICD-10-CM | POA: Diagnosis not present

## 2018-03-10 DIAGNOSIS — K6389 Other specified diseases of intestine: Secondary | ICD-10-CM | POA: Diagnosis not present

## 2018-03-10 DIAGNOSIS — E669 Obesity, unspecified: Secondary | ICD-10-CM | POA: Diagnosis not present

## 2018-03-10 DIAGNOSIS — E11649 Type 2 diabetes mellitus with hypoglycemia without coma: Secondary | ICD-10-CM | POA: Diagnosis present

## 2018-03-10 DIAGNOSIS — J309 Allergic rhinitis, unspecified: Secondary | ICD-10-CM | POA: Diagnosis present

## 2018-03-10 DIAGNOSIS — D473 Essential (hemorrhagic) thrombocythemia: Secondary | ICD-10-CM | POA: Diagnosis not present

## 2018-03-10 DIAGNOSIS — N132 Hydronephrosis with renal and ureteral calculous obstruction: Secondary | ICD-10-CM | POA: Diagnosis not present

## 2018-03-10 DIAGNOSIS — Z791 Long term (current) use of non-steroidal anti-inflammatories (NSAID): Secondary | ICD-10-CM

## 2018-03-10 DIAGNOSIS — R1900 Intra-abdominal and pelvic swelling, mass and lump, unspecified site: Secondary | ICD-10-CM

## 2018-03-10 DIAGNOSIS — E1159 Type 2 diabetes mellitus with other circulatory complications: Secondary | ICD-10-CM | POA: Diagnosis present

## 2018-03-10 DIAGNOSIS — I861 Scrotal varices: Secondary | ICD-10-CM | POA: Diagnosis present

## 2018-03-10 DIAGNOSIS — Z881 Allergy status to other antibiotic agents status: Secondary | ICD-10-CM

## 2018-03-10 DIAGNOSIS — Y92230 Patient room in hospital as the place of occurrence of the external cause: Secondary | ICD-10-CM | POA: Diagnosis not present

## 2018-03-10 DIAGNOSIS — D500 Iron deficiency anemia secondary to blood loss (chronic): Secondary | ICD-10-CM | POA: Diagnosis not present

## 2018-03-10 DIAGNOSIS — C8510 Unspecified B-cell lymphoma, unspecified site: Secondary | ICD-10-CM

## 2018-03-10 DIAGNOSIS — K922 Gastrointestinal hemorrhage, unspecified: Secondary | ICD-10-CM | POA: Diagnosis not present

## 2018-03-10 DIAGNOSIS — T451X5A Adverse effect of antineoplastic and immunosuppressive drugs, initial encounter: Secondary | ICD-10-CM | POA: Diagnosis not present

## 2018-03-10 DIAGNOSIS — K625 Hemorrhage of anus and rectum: Secondary | ICD-10-CM | POA: Diagnosis not present

## 2018-03-10 DIAGNOSIS — R0902 Hypoxemia: Secondary | ICD-10-CM | POA: Diagnosis not present

## 2018-03-10 DIAGNOSIS — N179 Acute kidney failure, unspecified: Secondary | ICD-10-CM | POA: Diagnosis not present

## 2018-03-10 DIAGNOSIS — T502X5A Adverse effect of carbonic-anhydrase inhibitors, benzothiadiazides and other diuretics, initial encounter: Secondary | ICD-10-CM | POA: Diagnosis present

## 2018-03-10 DIAGNOSIS — E1169 Type 2 diabetes mellitus with other specified complication: Secondary | ICD-10-CM | POA: Diagnosis present

## 2018-03-10 DIAGNOSIS — E119 Type 2 diabetes mellitus without complications: Secondary | ICD-10-CM | POA: Diagnosis not present

## 2018-03-10 DIAGNOSIS — C8330 Diffuse large B-cell lymphoma, unspecified site: Secondary | ICD-10-CM | POA: Diagnosis not present

## 2018-03-10 DIAGNOSIS — I2699 Other pulmonary embolism without acute cor pulmonale: Principal | ICD-10-CM | POA: Diagnosis present

## 2018-03-10 DIAGNOSIS — D72819 Decreased white blood cell count, unspecified: Secondary | ICD-10-CM | POA: Diagnosis not present

## 2018-03-10 DIAGNOSIS — D509 Iron deficiency anemia, unspecified: Secondary | ICD-10-CM | POA: Diagnosis present

## 2018-03-10 DIAGNOSIS — I82401 Acute embolism and thrombosis of unspecified deep veins of right lower extremity: Secondary | ICD-10-CM | POA: Diagnosis not present

## 2018-03-10 DIAGNOSIS — C8599 Non-Hodgkin lymphoma, unspecified, extranodal and solid organ sites: Secondary | ICD-10-CM | POA: Diagnosis not present

## 2018-03-10 DIAGNOSIS — E162 Hypoglycemia, unspecified: Secondary | ICD-10-CM | POA: Diagnosis present

## 2018-03-10 DIAGNOSIS — D630 Anemia in neoplastic disease: Secondary | ICD-10-CM | POA: Diagnosis present

## 2018-03-10 DIAGNOSIS — C8519 Unspecified B-cell lymphoma, extranodal and solid organ sites: Secondary | ICD-10-CM | POA: Diagnosis not present

## 2018-03-10 DIAGNOSIS — K5730 Diverticulosis of large intestine without perforation or abscess without bleeding: Secondary | ICD-10-CM | POA: Diagnosis present

## 2018-03-10 DIAGNOSIS — R710 Precipitous drop in hematocrit: Secondary | ICD-10-CM | POA: Diagnosis not present

## 2018-03-10 DIAGNOSIS — Z79899 Other long term (current) drug therapy: Secondary | ICD-10-CM

## 2018-03-10 DIAGNOSIS — I82890 Acute embolism and thrombosis of other specified veins: Secondary | ICD-10-CM | POA: Diagnosis present

## 2018-03-10 DIAGNOSIS — I5030 Unspecified diastolic (congestive) heart failure: Secondary | ICD-10-CM | POA: Diagnosis not present

## 2018-03-10 DIAGNOSIS — T45515A Adverse effect of anticoagulants, initial encounter: Secondary | ICD-10-CM | POA: Diagnosis not present

## 2018-03-10 DIAGNOSIS — N432 Other hydrocele: Secondary | ICD-10-CM | POA: Diagnosis not present

## 2018-03-10 DIAGNOSIS — C8590 Non-Hodgkin lymphoma, unspecified, unspecified site: Secondary | ICD-10-CM | POA: Diagnosis not present

## 2018-03-10 DIAGNOSIS — Z8572 Personal history of non-Hodgkin lymphomas: Secondary | ICD-10-CM

## 2018-03-10 DIAGNOSIS — I82409 Acute embolism and thrombosis of unspecified deep veins of unspecified lower extremity: Secondary | ICD-10-CM | POA: Diagnosis not present

## 2018-03-10 DIAGNOSIS — Z86711 Personal history of pulmonary embolism: Secondary | ICD-10-CM | POA: Diagnosis present

## 2018-03-10 DIAGNOSIS — D490 Neoplasm of unspecified behavior of digestive system: Secondary | ICD-10-CM | POA: Diagnosis not present

## 2018-03-10 DIAGNOSIS — D494 Neoplasm of unspecified behavior of bladder: Secondary | ICD-10-CM | POA: Diagnosis not present

## 2018-03-10 LAB — COMPREHENSIVE METABOLIC PANEL
ALBUMIN: 3.3 g/dL — AB (ref 3.5–5.0)
ALK PHOS: 32 U/L — AB (ref 38–126)
ALT: 14 U/L (ref 0–44)
AST: 21 U/L (ref 15–41)
Anion gap: 11 (ref 5–15)
BUN: 17 mg/dL (ref 8–23)
CALCIUM: 9.2 mg/dL (ref 8.9–10.3)
CO2: 23 mmol/L (ref 22–32)
CREATININE: 1.11 mg/dL (ref 0.61–1.24)
Chloride: 108 mmol/L (ref 98–111)
GFR calc Af Amer: >60 mL/min (ref >60)
GFR calc non Af Amer: >60 mL/min (ref >60)
GLUCOSE: 53 mg/dL — AB (ref 70–99)
Potassium: 3.6 mmol/L (ref 3.5–5.1)
SODIUM: 142 mmol/L (ref 135–145)
Total Bilirubin: 0.5 mg/dL (ref 0.3–1.2)
Total Protein: 7.3 g/dL (ref 6.5–8.1)

## 2018-03-10 LAB — GLUCOSE, CAPILLARY
Glucose-Capillary: 106 mg/dL — ABNORMAL HIGH (ref 70–99)
Glucose-Capillary: 197 mg/dL — ABNORMAL HIGH (ref 70–99)

## 2018-03-10 LAB — TROPONIN I

## 2018-03-10 LAB — PROTIME-INR
INR: 1.5
Prothrombin Time: 18 seconds — ABNORMAL HIGH (ref 11.4–15.2)

## 2018-03-10 LAB — CBC
HCT: 27.6 % — ABNORMAL LOW (ref 39.0–52.0)
HEMOGLOBIN: 8.5 g/dL — AB (ref 13.0–17.0)
MCH: 26.6 pg (ref 26.0–34.0)
MCHC: 30.8 g/dL (ref 30.0–36.0)
MCV: 86.3 fL (ref 78.0–100.0)
Platelets: 488 10*3/uL — ABNORMAL HIGH (ref 150–400)
RBC: 3.2 MIL/uL — AB (ref 4.22–5.81)
RDW: 17.3 % — ABNORMAL HIGH (ref 11.5–15.5)
WBC: 9.1 10*3/uL (ref 4.0–10.5)

## 2018-03-10 LAB — APTT: aPTT: 32 seconds (ref 24–36)

## 2018-03-10 LAB — MRSA PCR SCREENING: MRSA by PCR: NEGATIVE

## 2018-03-10 LAB — POC OCCULT BLOOD, ED: Fecal Occult Bld: POSITIVE — AB

## 2018-03-10 LAB — HEPARIN LEVEL (UNFRACTIONATED): Heparin Unfractionated: 1.88 IU/mL — ABNORMAL HIGH (ref 0.30–0.70)

## 2018-03-10 MED ORDER — HYDROCODONE-ACETAMINOPHEN 5-325 MG PO TABS: 1.0000 | ORAL_TABLET | ORAL | Status: DC | PRN

## 2018-03-10 MED ORDER — POLYETHYLENE GLYCOL 3350 17 G PO PACK: 17.0000 g | PACK | Freq: Every day | ORAL | Status: DC | PRN

## 2018-03-10 MED ORDER — SODIUM CHLORIDE 0.9 % IV SOLN
INTRAVENOUS | Status: AC
Start: 2018-03-10 — End: 2018-03-11
  Administered 2018-03-10 – 2018-03-11 (×2): via INTRAVENOUS

## 2018-03-10 MED ORDER — HEPARIN BOLUS VIA INFUSION
5000.0000 [IU] | INTRAVENOUS | Status: AC
  Administered 2018-03-10: 5000 [IU] via INTRAVENOUS
  Filled 2018-03-10: qty 5000

## 2018-03-10 MED ORDER — IOPAMIDOL (ISOVUE-370) INJECTION 76%
INTRAVENOUS | Status: AC
  Administered 2018-03-10: 100 mL
  Filled 2018-03-10: qty 100

## 2018-03-10 MED ORDER — ACETAMINOPHEN 325 MG PO TABS
650.0000 mg | ORAL_TABLET | Freq: Four times a day (QID) | ORAL | Status: DC | PRN
  Administered 2018-03-13 – 2018-03-25 (×6): 650 mg via ORAL
  Filled 2018-03-10 (×6): qty 2

## 2018-03-10 MED ORDER — ACETAMINOPHEN 650 MG RE SUPP: 650.0000 mg | Freq: Four times a day (QID) | RECTAL | Status: DC | PRN

## 2018-03-10 MED ORDER — INSULIN ASPART 100 UNIT/ML ~~LOC~~ SOLN
0.0000 [IU] | SUBCUTANEOUS | Status: DC
  Administered 2018-03-16: 2 [IU] via SUBCUTANEOUS
  Administered 2018-03-16: 1 [IU] via SUBCUTANEOUS
  Administered 2018-03-17 (×2): 2 [IU] via SUBCUTANEOUS
  Administered 2018-03-17: 1 [IU] via SUBCUTANEOUS
  Administered 2018-03-17: 2 [IU] via SUBCUTANEOUS
  Administered 2018-03-17: 3 [IU] via SUBCUTANEOUS

## 2018-03-10 MED ORDER — ONDANSETRON HCL 4 MG/2ML IJ SOLN: 4.0000 mg | Freq: Four times a day (QID) | INTRAMUSCULAR | Status: DC | PRN

## 2018-03-10 MED ORDER — FENOFIBRATE 160 MG PO TABS
160.0000 mg | ORAL_TABLET | Freq: Every day | ORAL | Status: DC
  Administered 2018-03-11 – 2018-03-25 (×13): 160 mg via ORAL
  Filled 2018-03-10 (×15): qty 1

## 2018-03-10 MED ORDER — DOCUSATE SODIUM 100 MG PO CAPS
100.0000 mg | ORAL_CAPSULE | Freq: Two times a day (BID) | ORAL | Status: DC
  Administered 2018-03-11 – 2018-03-25 (×23): 100 mg via ORAL
  Filled 2018-03-10 (×24): qty 1

## 2018-03-10 MED ORDER — HEPARIN (PORCINE) IN NACL 100-0.45 UNIT/ML-% IJ SOLN
1600.0000 [IU]/h | INTRAMUSCULAR | Status: DC
  Administered 2018-03-10: 1600 [IU]/h via INTRAVENOUS
  Filled 2018-03-10: qty 250

## 2018-03-10 MED ORDER — ONDANSETRON HCL 4 MG PO TABS
4.0000 mg | ORAL_TABLET | Freq: Four times a day (QID) | ORAL | Status: DC | PRN
Start: 2018-03-10 — End: 2018-03-25

## 2018-03-10 NOTE — ED Notes (Signed)
Report given to Rob, RN.

## 2018-03-10 NOTE — H&P (Signed)
Mark Frey BWI:[id number] DOB: Jan 21, 1956 DOA: 03/10/2018     PCP: [name], MD   Outpatient Specialists: NONE   Patient arrived to ER on 03/10/18 at 1205  Patient coming from:   home Lives  With family    Chief Complaint:  Chief Complaint  Patient presents with  . DVT    right leg    HPI: Mark Frey is a 62 y.o. male with medical history significant of HLD,DM2    Presented with   right lower extremity swelling for the past 10 days patient initially thought it was secondary to doing some work on the house he try to use ibuprofen and keep his foot elevated but did not seem to help swelling was noted to be from the thigh down to the foot. No prior history of blood clots he presented to per primary care provider who was concerned regarding DVT In the office check vitals noted to have oxygen saturation down to 90% and heart rate coming up to 137 with ambulation. He was instructed to go to emergency department but declined instead was started on Xarelto 50 mg twice a day Sent to have Dopplers done which showed large right-sided DVT but also was worrisome for mass in abdomen he presented to emergency department Otherwise he has not had any significant shortness of breath or chest pain no fevers or chills no weight loss. Denies melena noted that the water around the stool turned red yesterday.   No fever chills or night sweats. Never had a colonoscopy  occasional EtOh but not heavy.   Regarding pertinent Chronic problems: Known history of diabetes mellitus on glimepiride and Janumet and high cholesterol.    While in ER: Palpable mass in the right lower quadrant Occult positive from below Following Medications were ordered in ER: Medications  iopamidol (ISOVUE-370) 76 % injection (100 mLs  Contrast Given 03/10/18 1639)    Significant initial  Findings: Abnormal Labs Reviewed  CBC - Abnormal; Notable for the following components:      Result Value   RBC  3.20 (*)    Hemoglobin 8.5 (*)    HCT 27.6 (*)    RDW 17.3 (*)    Platelets 488 (*)    All other components within normal limits  COMPREHENSIVE METABOLIC PANEL - Abnormal; Notable for the following components:   Glucose, Bld 53 (*)    Albumin 3.3 (*)    Alkaline Phosphatase 32 (*)    All other components within normal limits  POC OCCULT BLOOD, ED - Abnormal; Notable for the following components:   Fecal Occult Bld POSITIVE (*)    All other components within normal limits   Doppler showing: Right acute DVT of right saphenofemoral junction, right common femoral vein, posterior tibial and peroneal veins    Na 142 K 3.6  Cr    Up from baseline see below Lab Results  Component Value Date   CREATININE 1.11 03/10/2018   CREATININE 1.08 08/29/2017   CREATININE 1.04 01/31/2017      WBC  9.1  HG/HCT  Down from baseline see below    Component Value Date/Time   HGB 8.5 (L) 03/10/2018 1530   HCT 27.6 (L) 03/10/2018 1530          UA not ordered     CTA - acute bilateral PE with evidence of Right side strain CTabd/pelvis - solid mass in the right lower quadrant measuring up to 18.7 x 18.5 x 17.8 cm 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 Mild right hydronephrosis  ECG:  ordered    ED Triage Vitals  Enc Vitals Group     BP 03/10/18 1213 (!) 154/89     Pulse Rate 03/10/18 1213 (!) 111     Resp 03/10/18 1213 18     Temp 03/10/18 1213 98 F (36.7 C)     Temp Source 03/10/18 1213 Oral     SpO2 03/10/18 1213 96 %     Weight 03/10/18 1215 260 lb (117.9 kg)     Height 03/10/18 1215 5' 9"  (1.753 m)     Head Circumference --      Peak Flow --      Pain Score 03/10/18 1215 0     Pain Loc --      Pain Edu? --      Excl. in [place]? --   TMAX(24)@       Latest  Blood pressure (!) 154/65, pulse (!) 114, temperature 98 F (36.7 C), temperature source Oral, resp. rate 19, height 5' 9"  (1.753 m), weight 117.9 kg (260 lb), SpO2 94 %.    ER Provider Called:   PCCM  Dr.Dr. Debbora Dus    They Recommend admit to try and start on heparin close monitoring hemoglobin hematocrit no indication for invasive intervention at this point    Hospitalist was called for admission for bilateral PE in the setting of newly diagnosed intra-abdominal mass   Review of Systems:    Pertinent positives include: blood around stool, right leg swelling,    Constitutional:  No weight loss, night sweats, Fevers, chills, fatigue, weight loss  HEENT:  No headaches, Difficulty swallowing,Tooth/dental problems,Sore throat,  No sneezing, itching, ear ache, nasal congestion, post nasal drip,  Cardio-vascular:  No chest pain, Orthopnea, PND, anasarca, dizziness, palpitations.no Bilateral lower extremity swelling  GI:  No heartburn, indigestion, abdominal pain, nausea, vomiting, diarrhea, change in bowel habits, loss of appetite, melena, blood in stool, hematemesis Resp:  no shortness of breath at rest. No dyspnea on exertion, No excess mucus, no productive cough, No non-productive cough, No coughing up of blood.No change in color of mucus.No wheezing. Skin:  no rash or lesions. No jaundice GU:  no dysuria, change in color of urine, no urgency or frequency. No straining to urinate.  No flank pain.  Musculoskeletal:  No joint pain or no joint swelling. No decreased range of motion. No back pain.  Psych:  No change in mood or affect. No depression or anxiety. No memory loss.  Neuro: no localizing neurological complaints, no tingling, no weakness, no double vision, no gait abnormality, no slurred speech, no confusion  As per HPI otherwise 10 point review of systems negative.   Past Medical History:   Past Medical History:  Diagnosis Date  . ALLERGIC RHINITIS   . Diabetes mellitus   . Hyperlipidemia       Past Surgical History:  Procedure Laterality Date  . CLEFT PALATE REPAIR    . deviated septum repair     slight improvement  . TONSILLECTOMY      Social History:  Ambulatory    independently      reports that he has never smoked. He has never used smokeless tobacco. He reports that he drinks alcohol. He reports that he does not use drugs.     Family History:   Family History  Problem Relation Age of Onset  . Lung cancer Mother        smoker  . Brain cancer Mother  metastasis  . AAA (abdominal aortic aneurysm) Father        smoker    Allergies: Allergies  Allergen Reactions  . Ciprofloxacin Other (See Comments)    Leg tingling     Prior to Admission medications   Medication Sig Start Date End Date Taking? Authorizing Provider  aspirin 81 MG tablet Take 81 mg by mouth daily.   Yes [provider]  Choline Fenofibrate 135 MG capsule TAKE 1 CAPSULE DAILY 11/29/17  Yes [name], MD  glimepiride (AMARYL) 2 MG tablet Take 1 tablet (2 mg total) by mouth daily before breakfast. Patient taking differently: Take 1 mg by mouth daily before breakfast.  12/28/17  Yes [name], MD  ibuprofen (ADVIL,MOTRIN) 200 MG tablet Take 200 mg by mouth every 6 (six) hours as needed for moderate pain.   Yes [provider]  JANUMET 50-1000 MG tablet TAKE 1 TABLET TWICE DAILY  WITH MEALS 11/29/17  Yes [name], MD  Multiple Vitamin (MULTIVITAMIN) capsule Take 1 capsule by mouth daily.     Yes [provider]  RIVAROXABAN PO Take 1 mg by mouth daily with supper.   Yes [provider]  glucose blood (FREESTYLE TEST STRIPS) test strip 1 each by Other route daily. Use as instructed     [provider]  Lancets (FREESTYLE) lancets 1 each by Other route as directed. Use as instructed     [provider]   Physical Exam: Blood pressure (!) 154/65, pulse (!) 114, temperature 98 F (36.7 C), temperature source Oral, resp. rate 19, height 5' 9"  (1.753 m), weight 117.9 kg (260 lb), SpO2 94 %. 1. General:  in No Acute distress  well  -appearing 2. Psychological: Alert and   Oriented 3. Head/ENT:     Dry  Mucous Membranes                          Head Non traumatic, neck supple                           Poor Dentition 4. SKIN:  decreased Skin turgor,  Skin clean Dry and intact no rash 5. Heart: Regular rate and rhythm no Murmur, no Rub or gallop 6. Lungs:  no wheezes or crackles   7. Abdomen: Soft,  non-tender, Non distended  obese  bowel sounds present, Right lower quadrant palpable mass. 8. Lower extremities: no clubbing, cyanosis, or edema 9. Neurologically Grossly intact, moving all 4 extremities equally   10. MSK: Normal range of motion   LABS:     Recent Labs  Lab 03/10/18 1530  WBC 9.1  HGB 8.5*  HCT 27.6*  MCV 86.3  PLT 536*   Basic Metabolic Panel: Recent Labs  Lab 03/10/18 1530  NA 142  K 3.6  CL 108  CO2 23  GLUCOSE 53*  BUN 17  CREATININE 1.11  CALCIUM 9.2      Recent Labs  Lab 03/10/18 1530  AST 21  ALT 14  ALKPHOS 32*  BILITOT 0.5  PROT 7.3  ALBUMIN 3.3*   No results for input(s): LIPASE, AMYLASE in the last 168 hours. No results for input(s): AMMONIA in the last 168 hours.    HbA1C: No results for input(s): HGBA1C in the last 72 hours. CBG: No results for input(s): GLUCAP in the last 168 hours.    Urine analysis:    Component Value  Date/Time   COLORURINE yellow 01/20/2009 0840   APPEARANCEUR Clear 01/20/2009 0840   LABSPEC 1.010 01/20/2009 0840   PHURINE 5.5 01/20/2009 0840   HGBUR negative 01/20/2009 0840   BILIRUBINUR Small 01/30/2018 1652   PROTEINUR Positive (A) 01/30/2018 1652   UROBILINOGEN 2.0 (A) 01/30/2018 1652   UROBILINOGEN 0.2 01/20/2009 0840   NITRITE Negative 01/30/2018 1652   NITRITE negative 01/20/2009 0840   LEUKOCYTESUR Negative 01/30/2018 1652       Cultures: No results found for: SDES, SPECREQUEST, CULT, REPTSTATUS   Radiological Exams on Admission: Ct Angio Chest Pe W/cm &/or Wo Cm  Result Date: 03/10/2018 CLINICAL DATA:  Acute DVT in the right lower extremity by report. Palpable right abdominal  mass. EXAM: CT ANGIOGRAPHY CHEST CT ABDOMEN AND PELVIS WITH CONTRAST TECHNIQUE: Multidetector CT imaging of the chest was performed using the standard protocol during bolus administration of intravenous contrast. Multiplanar CT image reconstructions and MIPs were obtained to evaluate the vascular anatomy. Multidetector CT imaging of the abdomen and pelvis was performed using the standard protocol during bolus administration of intravenous contrast. CONTRAST:  167m ISOVUE-370 IOPAMIDOL (ISOVUE-370) INJECTION 76% COMPARISON:  None. FINDINGS: CTA CHEST FINDINGS Cardiovascular: The study is moderate quality for the evaluation of pulmonary embolism, limited by motion degradation particularly in the lower lungs. There are lobar, segmental and subsegmental pulmonary emboli in the left upper, left lower and right upper lobes. There are segmental and subsegmental pulmonary emboli in the right lower lobe. No central or saddle pulmonary emboli. Dilated main pulmonary artery (3.5 cm diameter). Normal course and caliber of thoracic aorta. Top-normal heart size. Elevated RV/LV ratio 1.1. No significant pericardial fluid/thickening. Left anterior descending coronary atherosclerosis. Mediastinum/Nodes: No discrete thyroid nodules. Unremarkable esophagus. No pathologically enlarged axillary, mediastinal or hilar lymph nodes. Lungs/Pleura: No pneumothorax. No pleural effusion. Subpleural 2 mm basilar left lower lobe solid pulmonary nodule (series 4/image 122). No acute consolidative airspace disease, lung masses or additional significant pulmonary nodules. Musculoskeletal: No aggressive appearing focal osseous lesions. Moderate thoracic spondylosis. Review of the MIP images confirms the above findings. CT ABDOMEN and PELVIS FINDINGS Hepatobiliary: Normal liver size. Solitary subcentimeter hypodense right liver dome lesion (series 4/image 11), too small to characterize. No additional liver lesions. Normal gallbladder with no  radiopaque cholelithiasis. No biliary ductal dilatation. Pancreas: Normal, with no mass or duct dilation. Spleen: Normal size. No mass. Adrenals/Urinary Tract: Normal adrenals. Mild right hydronephrosis with ill-defined soft tissue encasing the right ureteropelvic junction. Nonobstructing 5 mm stone in the posterior lower right kidney. No additional renal stones. No left hydronephrosis. A few subcentimeter hypodense renal cortical lesions scattered in the kidneys bilaterally, too small to characterize. Normal caliber left ureter. Masslike wall thickening in the distal pelvic segment of the right ureter extending to the right ureterovesical junction measuring up to 2.5 x 1.9 x 5.0 cm (series 4/image 72). Otherwise normal nondistended bladder. Stomach/Bowel: Normal non-distended stomach. There is a large irregular infiltrative solid mass in the right lower quadrant measuring up to 18.7 x 18.5 x 17.8 cm (series 4/image 57), which infiltrates and encases multiple distal small bowel loops and likely the ileocecal region. There is partial encasement of the sigmoid colon by the mass. There is prominent extension of the mass into the right lower retroperitoneum and extraperitoneal right pelvis with encasement of the right external iliac and proximal right common iliac vasculature and with infiltration of the right iliopsoas muscle. There is dilatation of the involved right sided small bowel loops. No proximal small bowel dilatation.  The transverse and left colon are collapsed with no wall thickening in these locations. Moderate sigmoid diverticulosis. Vascular/Lymphatic: Atherosclerotic nonaneurysmal abdominal aorta. Patent portal, splenic, hepatic and renal veins. There is bulky soft tissue throughout right external and common iliac nodal chains. Reproductive: Top-normal size prostate with coarse nonspecific internal prostatic calcifications. Other: No pneumoperitoneum, ascites or focal fluid collection. Trace presacral  fluid. Asymmetric fat stranding throughout subcutaneous right proximal lower extremity and right flank. Musculoskeletal: No aggressive appearing focal osseous lesions. Moderate lumbar spondylosis. Review of the MIP images confirms the above findings. IMPRESSION: CT CHEST: 1. Acute bilateral pulmonary embolism involving lobar, segmental and subsegmental branches. No saddle pulmonary emboli. Positive for acute PE with CT evidence of right heart strain (RV/LV Ratio = 1.1) consistent with at least submassive (intermediate risk) PE. The presence of right heart strain has been associated with an increased risk of morbidity and mortality. Please activate Code PE by paging [phone]. 2. Dilated main pulmonary artery, suggesting pulmonary arterial hypertension. 3. 1 vessel coronary atherosclerosis. 4. No findings suspicious for metastatic disease in the chest. Solitary 2 mm subpleural left lower lobe solid pulmonary nodule, for which follow-up chest CT is advised in 3 months. CT ABDOMEN and PELVIS: 1. Large irregular infiltrative solid mass in the right lower quadrant measuring up to 18.7 x 18.5 x 17.8 cm, infiltrating and encasing multiple distal small bowel loops and likely the ileocecal region, partially encasing the sigmoid colon, with prominent extension into the right lower retroperitoneum and extraperitoneal right pelvis with encasement of right external iliac and proximal right common iliac vasculature and infiltration of the right iliopsoas muscle. 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 Differential includes small bowel GIST or adenocarcinoma. 2. Mild right hydronephrosis due to thick soft tissue encasing the right ureter most prominently at the right UPJ and right UVJ. 3. Nonobstructing right nephrolithiasis. 4. Moderate sigmoid diverticulosis. 5.  Aortic Atherosclerosis (ICD10-I70.0). Critical Value/emergent results were called by telephone at the time of interpretation on 03/10/2018 at 5:38 pm to Dr. Kennith Maes , who verbally acknowledged these results. Electronically Signed   By: Ilona Sorrel M.D.   On: 03/10/2018 17:41   Ct Abdomen Pelvis W Contrast  Result Date: 03/10/2018 CLINICAL DATA:  Acute DVT in the right lower extremity by report. Palpable right abdominal mass. EXAM: CT ANGIOGRAPHY CHEST CT ABDOMEN AND PELVIS WITH CONTRAST TECHNIQUE: Multidetector CT imaging of the chest was performed using the standard protocol during bolus administration of intravenous contrast. Multiplanar CT image reconstructions and MIPs were obtained to evaluate the vascular anatomy. Multidetector CT imaging of the abdomen and pelvis was performed using the standard protocol during bolus administration of intravenous contrast. CONTRAST:  124m ISOVUE-370 IOPAMIDOL (ISOVUE-370) INJECTION 76% COMPARISON:  None. FINDINGS: CTA CHEST FINDINGS Cardiovascular: The study is moderate quality for the evaluation of pulmonary embolism, limited by motion degradation particularly in the lower lungs. There are lobar, segmental and subsegmental pulmonary emboli in the left upper, left lower and right upper lobes. There are segmental and subsegmental pulmonary emboli in the right lower lobe. No central or saddle pulmonary emboli. Dilated main pulmonary artery (3.5 cm diameter). Normal course and caliber of thoracic aorta. Top-normal heart size. Elevated RV/LV ratio 1.1. No significant pericardial fluid/thickening. Left anterior descending coronary atherosclerosis. Mediastinum/Nodes: No discrete thyroid nodules. Unremarkable esophagus. No pathologically enlarged axillary, mediastinal or hilar lymph nodes. Lungs/Pleura: No pneumothorax. No pleural effusion. Subpleural 2 mm basilar left lower lobe solid pulmonary nodule (series 4/image 122). No acute consolidative airspace disease, lung masses or  additional significant pulmonary nodules. Musculoskeletal: No aggressive appearing focal osseous lesions. Moderate thoracic spondylosis. Review of the  MIP images confirms the above findings. CT ABDOMEN and PELVIS FINDINGS Hepatobiliary: Normal liver size. Solitary subcentimeter hypodense right liver dome lesion (series 4/image 11), too small to characterize. No additional liver lesions. Normal gallbladder with no radiopaque cholelithiasis. No biliary ductal dilatation. Pancreas: Normal, with no mass or duct dilation. Spleen: Normal size. No mass. Adrenals/Urinary Tract: Normal adrenals. Mild right hydronephrosis with ill-defined soft tissue encasing the right ureteropelvic junction. Nonobstructing 5 mm stone in the posterior lower right kidney. No additional renal stones. No left hydronephrosis. A few subcentimeter hypodense renal cortical lesions scattered in the kidneys bilaterally, too small to characterize. Normal caliber left ureter. Masslike wall thickening in the distal pelvic segment of the right ureter extending to the right ureterovesical junction measuring up to 2.5 x 1.9 x 5.0 cm (series 4/image 72). Otherwise normal nondistended bladder. Stomach/Bowel: Normal non-distended stomach. There is a large irregular infiltrative solid mass in the right lower quadrant measuring up to 18.7 x 18.5 x 17.8 cm (series 4/image 57), which infiltrates and encases multiple distal small bowel loops and likely the ileocecal region. There is partial encasement of the sigmoid colon by the mass. There is prominent extension of the mass into the right lower retroperitoneum and extraperitoneal right pelvis with encasement of the right external iliac and proximal right common iliac vasculature and with infiltration of the right iliopsoas muscle. There is dilatation of the involved right sided small bowel loops. No proximal small bowel dilatation. The transverse and left colon are collapsed with no wall thickening in these locations. Moderate sigmoid diverticulosis. Vascular/Lymphatic: Atherosclerotic nonaneurysmal abdominal aorta. Patent portal, splenic, hepatic and renal  veins. There is bulky soft tissue throughout right external and common iliac nodal chains. Reproductive: Top-normal size prostate with coarse nonspecific internal prostatic calcifications. Other: No pneumoperitoneum, ascites or focal fluid collection. Trace presacral fluid. Asymmetric fat stranding throughout subcutaneous right proximal lower extremity and right flank. Musculoskeletal: No aggressive appearing focal osseous lesions. Moderate lumbar spondylosis. Review of the MIP images confirms the above findings. IMPRESSION: CT CHEST: 1. Acute bilateral pulmonary embolism involving lobar, segmental and subsegmental branches. No saddle pulmonary emboli. Positive for acute PE with CT evidence of right heart strain (RV/LV Ratio = 1.1) consistent with at least submassive (intermediate risk) PE. The presence of right heart strain has been associated with an increased risk of morbidity and mortality. Please activate Code PE by paging [phone]. 2. Dilated main pulmonary artery, suggesting pulmonary arterial hypertension. 3. 1 vessel coronary atherosclerosis. 4. No findings suspicious for metastatic disease in the chest. Solitary 2 mm subpleural left lower lobe solid pulmonary nodule, for which follow-up chest CT is advised in 3 months. CT ABDOMEN and PELVIS: 1. Large irregular infiltrative solid mass in the right lower quadrant measuring up to 18.7 x 18.5 x 17.8 cm, infiltrating and encasing multiple distal small bowel loops and likely the ileocecal region, partially encasing the sigmoid colon, with prominent extension into the right lower retroperitoneum and extraperitoneal right pelvis with encasement of right external iliac and proximal right common iliac vasculature and infiltration of the right iliopsoas muscle. 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 Differential includes small bowel GIST or adenocarcinoma. 2. Mild right hydronephrosis due to thick soft tissue encasing the right ureter most prominently at the right UPJ  and right UVJ. 3. Nonobstructing right nephrolithiasis. 4. Moderate sigmoid diverticulosis. 5.  Aortic Atherosclerosis (ICD10-I70.0). Critical Value/emergent results were called by telephone  at the time of interpretation on 03/10/2018 at 5:38 pm to Dr. Kennith Maes , who verbally acknowledged these results. Electronically Signed   By: Ilona Sorrel M.D.   On: 03/10/2018 17:41    Chart has been reviewed    Assessment/Plan   62 y.o. male with medical history significant of HLD,DM2  Admitted for  bilateral PE , right leg DVT in the setting of newly diagnosed intra-abdominal mass  Present on Admission:  . Bilateral pulmonary embolism ([place]) -  Admit to     stepdown Initiate heparin drip  Would likely benefit from case manager consult for long term anticoagulation Hold home blood pressure medications avoid hypotension Cycle cardiac enzymes Order echogram    Most likely risk factors for hypercoagulable state being  obesity  And likely malignancy    Given large PE [facility] M consulted and code PE activated, per PCCM patient not a candidate for tPA  . Hyperlipidemia - stable continue home medications . Hypertension -patient not on home medications monitor blood pressure while in stepdown . Anemia- - Most likely cause of Anemia  Chronic GI blood loss, vs anemia of chronic disease with a given malignancy -Obtain anemia panel - Hemoccult stool positive - Check TSH Given intra-abdominal mass need for anticoagulation given large PE will monitor with frequent CBC - Obtain type and screen,  Transfuse for hemoglobin below 7 or patient being significantly symptomatic    . Hypoglycemia transient currently improved after patient ate supper . Occult blood in stools patient did report small amount of blood around the stool yesterday no pain with defecation.  Never had a colonoscopy will probably need further work-up overall given known large intra-abdominal mass . Intraabdominal mass - discussed with  oncology who will guide biopsy and further management Dm 2-  - Order Sensitive  SSI   -  check TSH and HgA1C  - Hold by mouth medications     Other plan as per orders.  DVT prophylaxis: Heparin  Code Status:  FULL CODE  as per patient   I had personally discussed CODE STATUS with patient and family   Family Communication:   Family  at  Bedside  plan of care was discussed with  Wife  Disposition Plan:         To home once workup is complete and patient is stable             Consults called: pCCM Aware, oncology has been consulted will see in AM  Admission status:   inpatient      Level of care       SDU       Reynold Mantell 03/10/2018, 7:58 PM    [facility]  Pager [phone]   after 2 AM please page floor coverage PA If 7AM-7PM, please contact the day team taking care of the patient  [email or web address]  Password [email or web address]

## 2018-03-10 NOTE — Telephone Encounter (Signed)
This encounter was created in error - please disregard.

## 2018-03-10 NOTE — ED Triage Notes (Signed)
Patient was had an [place] today and was brought to the ED because they found a DVT of the right leg.

## 2018-03-10 NOTE — Progress Notes (Signed)
*  Preliminary Results* Right lower extremity venous duplex completed. Right lower extremity is positive for acute deep vein thrombosis involving the right saphenofemoral junction, right common femoral vein, posterior tibial and peroneal veins. The right external and common iliac veins are patent with no obvious evidence of acute thrombosis. There is no evidence of right Baker's cyst.  Incidental finding: While attempting to visualize the common iliac vein, a large, vascularized, heterogenous area of the right lower quadrant was visualized; unable to measure due to large size. Etiology is unknown. Further imaging with an additional modality may be warranted if clinically indicated. Of note- this area is palpable.  Preliminary results discussed with Dr. Jerline Pain.  03/10/2018 11:48 AM Maudry Mayhew, BS, RVT, RDCS, RDMS

## 2018-03-10 NOTE — ED Notes (Signed)
ED TO INPATIENT HANDOFF REPORT  Name/Age/Gender Mark Frey 62 y.o. male  Code Status   Home/SNF/Other Home  Chief Complaint blood clot located in right leg Sent over by ultr sound dept.  Level of Care/Admitting Diagnosis ED Disposition    ED Disposition Condition [facility] Area: [place] [[id number]]  Level of Care: Stepdown [14]  Admit to SDU based on following criteria: Hemodynamic compromise or significant risk of instability:  Patient requiring short term acute titration and management of vasoactive drips, and invasive monitoring (i.e., CVP and Arterial line).  Diagnosis: Bilateral pulmonary embolism [facility]) [[id number]]  Admitting Physician: [name] [[id number]]  Attending Physician: [name] [[id number]]  Estimated length of stay: 3 - 4 days  Certification:: I certify this patient will need inpatient services for at least 2 midnights  PT Class (Do Not Modify): Inpatient [101]  PT Acc Code (Do Not Modify): Private [1]       Medical History Past Medical History:  Diagnosis Date  . ALLERGIC RHINITIS   . Diabetes mellitus   . Hyperlipidemia     Allergies Allergies  Allergen Reactions  . Ciprofloxacin Other (See Comments)    Leg tingling    IV Location/Drains/Wounds Patient Lines/Drains/Airways Status   Active Line/Drains/Airways    Name:   Placement date:   Placement time:   Site:   Days:   Peripheral IV 03/10/18 Right Arm   03/10/18    1545    Arm   less than 1          Labs/Imaging Results for orders placed or performed during the hospital encounter of 03/10/18 (from the past 48 hour(s))  CBC     Status: Abnormal   Collection Time: 03/10/18  3:30 PM  Result Value Ref Range   WBC 9.1 4.0 - 10.5 K/uL   RBC 3.20 (L) 4.22 - 5.81 MIL/uL   Hemoglobin 8.5 (L) 13.0 - 17.0 g/dL   HCT 27.6 (L) 39.0 - 52.0 %   MCV 86.3 78.0 - 100.0 fL   MCH 26.6 26.0 - 34.0 pg   MCHC 30.8 30.0 - 36.0 g/dL   RDW 17.3 (H) 11.5 -  15.5 %   Platelets 488 (H) 150 - 400 K/uL    Comment: Performed at [facility], [place] [address], [place], [place] [address]  Comprehensive metabolic panel     Status: Abnormal   Collection Time: 03/10/18  3:30 PM  Result Value Ref Range   Sodium 142 135 - 145 mmol/L   Potassium 3.6 3.5 - 5.1 mmol/L   Chloride 108 98 - 111 mmol/L    Comment: Please note change in reference range.   CO2 23 22 - 32 mmol/L   Glucose, Bld 53 (L) 70 - 99 mg/dL    Comment: Please note change in reference range.   BUN 17 8 - 23 mg/dL    Comment: Please note change in reference range.   Creatinine, Ser 1.11 0.61 - 1.24 mg/dL   Calcium 9.2 8.9 - 10.3 mg/dL   Total Protein 7.3 6.5 - 8.1 g/dL   Albumin 3.3 (L) 3.5 - 5.0 g/dL   AST 21 15 - 41 U/L   ALT 14 0 - 44 U/L    Comment: Please note change in reference range.   Alkaline Phosphatase 32 (L) 38 - 126 U/L   Total Bilirubin 0.5 0.3 - 1.2 mg/dL   GFR calc non Af Amer >60 >60 mL/min   GFR  calc Af Amer >60 >60 mL/min    Comment: (NOTE) The eGFR has been calculated using the CKD EPI equation. This calculation has not been validated in all clinical situations. eGFR's persistently <60 mL/min signify possible Chronic Kidney Disease.    Anion gap 11 5 - 15    Comment: Performed at [facility], [place] [address] [place], [place] [address]  POC occult blood, ED Provider will collect     Status: Abnormal   Collection Time: 03/10/18  5:48 PM  Result Value Ref Range   Fecal Occult Bld POSITIVE (A) NEGATIVE  APTT     Status: None   Collection Time: 03/10/18  6:47 PM  Result Value Ref Range   aPTT 32 24 - 36 seconds    Comment: Performed at [facility], [place] [address] [place], [place] [address]  Protime-INR     Status: Abnormal   Collection Time: 03/10/18  6:47 PM  Result Value Ref Range   Prothrombin Time 18.0 (H) 11.4 - 15.2 seconds   INR 1.50     Comment: Performed at [facility],  [place] [address] [place], [place] [address]  Troponin I (q 6hr x 3)     Status: None   Collection Time: 03/10/18  7:14 PM  Result Value Ref Range   Troponin I <0.03 <0.03 ng/mL    Comment: Performed at [facility], [place] [address] [place], [place] [address]   Ct Angio Chest Pe W/cm &/or Wo Cm  Result Date: 03/10/2018 CLINICAL DATA:  Acute DVT in the right lower extremity by report. Palpable right abdominal mass. EXAM: CT ANGIOGRAPHY CHEST CT ABDOMEN AND PELVIS WITH CONTRAST TECHNIQUE: Multidetector CT imaging of the chest was performed using the standard protocol during bolus administration of intravenous contrast. Multiplanar CT image reconstructions and MIPs were obtained to evaluate the vascular anatomy. Multidetector CT imaging of the abdomen and pelvis was performed using the standard protocol during bolus administration of intravenous contrast. CONTRAST:  158m ISOVUE-370 IOPAMIDOL (ISOVUE-370) INJECTION 76% COMPARISON:  None. FINDINGS: CTA CHEST FINDINGS Cardiovascular: The study is moderate quality for the evaluation of pulmonary embolism, limited by motion degradation particularly in the lower lungs. There are lobar, segmental and subsegmental pulmonary emboli in the left upper, left lower and right upper lobes. There are segmental and subsegmental pulmonary emboli in the right lower lobe. No central or saddle pulmonary emboli. Dilated main pulmonary artery (3.5 cm diameter). Normal course and caliber of thoracic aorta. Top-normal heart size. Elevated RV/LV ratio 1.1. No significant pericardial fluid/thickening. Left anterior descending coronary atherosclerosis. Mediastinum/Nodes: No discrete thyroid nodules. Unremarkable esophagus. No pathologically enlarged axillary, mediastinal or hilar lymph nodes. Lungs/Pleura: No pneumothorax. No pleural effusion. Subpleural 2 mm basilar left lower lobe solid pulmonary nodule (series 4/image 122). No acute consolidative airspace disease,  lung masses or additional significant pulmonary nodules. Musculoskeletal: No aggressive appearing focal osseous lesions. Moderate thoracic spondylosis. Review of the MIP images confirms the above findings. CT ABDOMEN and PELVIS FINDINGS Hepatobiliary: Normal liver size. Solitary subcentimeter hypodense right liver dome lesion (series 4/image 11), too small to characterize. No additional liver lesions. Normal gallbladder with no radiopaque cholelithiasis. No biliary ductal dilatation. Pancreas: Normal, with no mass or duct dilation. Spleen: Normal size. No mass. Adrenals/Urinary Tract: Normal adrenals. Mild right hydronephrosis with ill-defined soft tissue encasing the right ureteropelvic junction. Nonobstructing 5 mm stone in the posterior lower right kidney. No additional renal stones. No left hydronephrosis. A few subcentimeter hypodense renal  cortical lesions scattered in the kidneys bilaterally, too small to characterize. Normal caliber left ureter. Masslike wall thickening in the distal pelvic segment of the right ureter extending to the right ureterovesical junction measuring up to 2.5 x 1.9 x 5.0 cm (series 4/image 72). Otherwise normal nondistended bladder. Stomach/Bowel: Normal non-distended stomach. There is a large irregular infiltrative solid mass in the right lower quadrant measuring up to 18.7 x 18.5 x 17.8 cm (series 4/image 57), which infiltrates and encases multiple distal small bowel loops and likely the ileocecal region. There is partial encasement of the sigmoid colon by the mass. There is prominent extension of the mass into the right lower retroperitoneum and extraperitoneal right pelvis with encasement of the right external iliac and proximal right common iliac vasculature and with infiltration of the right iliopsoas muscle. There is dilatation of the involved right sided small bowel loops. No proximal small bowel dilatation. The transverse and left colon are collapsed with no wall thickening  in these locations. Moderate sigmoid diverticulosis. Vascular/Lymphatic: Atherosclerotic nonaneurysmal abdominal aorta. Patent portal, splenic, hepatic and renal veins. There is bulky soft tissue throughout right external and common iliac nodal chains. Reproductive: Top-normal size prostate with coarse nonspecific internal prostatic calcifications. Other: No pneumoperitoneum, ascites or focal fluid collection. Trace presacral fluid. Asymmetric fat stranding throughout subcutaneous right proximal lower extremity and right flank. Musculoskeletal: No aggressive appearing focal osseous lesions. Moderate lumbar spondylosis. Review of the MIP images confirms the above findings. IMPRESSION: CT CHEST: 1. Acute bilateral pulmonary embolism involving lobar, segmental and subsegmental branches. No saddle pulmonary emboli. Positive for acute PE with CT evidence of right heart strain (RV/LV Ratio = 1.1) consistent with at least submassive (intermediate risk) PE. The presence of right heart strain has been associated with an increased risk of morbidity and mortality. Please activate Code PE by paging [phone]. 2. Dilated main pulmonary artery, suggesting pulmonary arterial hypertension. 3. 1 vessel coronary atherosclerosis. 4. No findings suspicious for metastatic disease in the chest. Solitary 2 mm subpleural left lower lobe solid pulmonary nodule, for which follow-up chest CT is advised in 3 months. CT ABDOMEN and PELVIS: 1. Large irregular infiltrative solid mass in the right lower quadrant measuring up to 18.7 x 18.5 x 17.8 cm, infiltrating and encasing multiple distal small bowel loops and likely the ileocecal region, partially encasing the sigmoid colon, with prominent extension into the right lower retroperitoneum and extraperitoneal right pelvis with encasement of right external iliac and proximal right common iliac vasculature and infiltration of the right iliopsoas muscle. 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  Differential includes small bowel GIST or adenocarcinoma. 2. Mild right hydronephrosis due to thick soft tissue encasing the right ureter most prominently at the right UPJ and right UVJ. 3. Nonobstructing right nephrolithiasis. 4. Moderate sigmoid diverticulosis. 5.  Aortic Atherosclerosis (ICD10-I70.0). Critical Value/emergent results were called by telephone at the time of interpretation on 03/10/2018 at 5:38 pm to Dr. Kennith Maes , who verbally acknowledged these results. Electronically Signed   By: Ilona Sorrel M.D.   On: 03/10/2018 17:41   Ct Abdomen Pelvis W Contrast  Result Date: 03/10/2018 CLINICAL DATA:  Acute DVT in the right lower extremity by report. Palpable right abdominal mass. EXAM: CT ANGIOGRAPHY CHEST CT ABDOMEN AND PELVIS WITH CONTRAST TECHNIQUE: Multidetector CT imaging of the chest was performed using the standard protocol during bolus administration of intravenous contrast. Multiplanar CT image reconstructions and MIPs were obtained to evaluate the vascular anatomy. Multidetector CT imaging of the abdomen and pelvis was  performed using the standard protocol during bolus administration of intravenous contrast. CONTRAST:  18m ISOVUE-370 IOPAMIDOL (ISOVUE-370) INJECTION 76% COMPARISON:  None. FINDINGS: CTA CHEST FINDINGS Cardiovascular: The study is moderate quality for the evaluation of pulmonary embolism, limited by motion degradation particularly in the lower lungs. There are lobar, segmental and subsegmental pulmonary emboli in the left upper, left lower and right upper lobes. There are segmental and subsegmental pulmonary emboli in the right lower lobe. No central or saddle pulmonary emboli. Dilated main pulmonary artery (3.5 cm diameter). Normal course and caliber of thoracic aorta. Top-normal heart size. Elevated RV/LV ratio 1.1. No significant pericardial fluid/thickening. Left anterior descending coronary atherosclerosis. Mediastinum/Nodes: No discrete thyroid nodules.  Unremarkable esophagus. No pathologically enlarged axillary, mediastinal or hilar lymph nodes. Lungs/Pleura: No pneumothorax. No pleural effusion. Subpleural 2 mm basilar left lower lobe solid pulmonary nodule (series 4/image 122). No acute consolidative airspace disease, lung masses or additional significant pulmonary nodules. Musculoskeletal: No aggressive appearing focal osseous lesions. Moderate thoracic spondylosis. Review of the MIP images confirms the above findings. CT ABDOMEN and PELVIS FINDINGS Hepatobiliary: Normal liver size. Solitary subcentimeter hypodense right liver dome lesion (series 4/image 11), too small to characterize. No additional liver lesions. Normal gallbladder with no radiopaque cholelithiasis. No biliary ductal dilatation. Pancreas: Normal, with no mass or duct dilation. Spleen: Normal size. No mass. Adrenals/Urinary Tract: Normal adrenals. Mild right hydronephrosis with ill-defined soft tissue encasing the right ureteropelvic junction. Nonobstructing 5 mm stone in the posterior lower right kidney. No additional renal stones. No left hydronephrosis. A few subcentimeter hypodense renal cortical lesions scattered in the kidneys bilaterally, too small to characterize. Normal caliber left ureter. Masslike wall thickening in the distal pelvic segment of the right ureter extending to the right ureterovesical junction measuring up to 2.5 x 1.9 x 5.0 cm (series 4/image 72). Otherwise normal nondistended bladder. Stomach/Bowel: Normal non-distended stomach. There is a large irregular infiltrative solid mass in the right lower quadrant measuring up to 18.7 x 18.5 x 17.8 cm (series 4/image 57), which infiltrates and encases multiple distal small bowel loops and likely the ileocecal region. There is partial encasement of the sigmoid colon by the mass. There is prominent extension of the mass into the right lower retroperitoneum and extraperitoneal right pelvis with encasement of the right external  iliac and proximal right common iliac vasculature and with infiltration of the right iliopsoas muscle. There is dilatation of the involved right sided small bowel loops. No proximal small bowel dilatation. The transverse and left colon are collapsed with no wall thickening in these locations. Moderate sigmoid diverticulosis. Vascular/Lymphatic: Atherosclerotic nonaneurysmal abdominal aorta. Patent portal, splenic, hepatic and renal veins. There is bulky soft tissue throughout right external and common iliac nodal chains. Reproductive: Top-normal size prostate with coarse nonspecific internal prostatic calcifications. Other: No pneumoperitoneum, ascites or focal fluid collection. Trace presacral fluid. Asymmetric fat stranding throughout subcutaneous right proximal lower extremity and right flank. Musculoskeletal: No aggressive appearing focal osseous lesions. Moderate lumbar spondylosis. Review of the MIP images confirms the above findings. IMPRESSION: CT CHEST: 1. Acute bilateral pulmonary embolism involving lobar, segmental and subsegmental branches. No saddle pulmonary emboli. Positive for acute PE with CT evidence of right heart strain (RV/LV Ratio = 1.1) consistent with at least submassive (intermediate risk) PE. The presence of right heart strain has been associated with an increased risk of morbidity and mortality. Please activate Code PE by paging [phone] 2. Dilated main pulmonary artery, suggesting pulmonary arterial hypertension. 3. 1 vessel coronary atherosclerosis. 4. No  findings suspicious for metastatic disease in the chest. Solitary 2 mm subpleural left lower lobe solid pulmonary nodule, for which follow-up chest CT is advised in 3 months. CT ABDOMEN and PELVIS: 1. Large irregular infiltrative solid mass in the right lower quadrant measuring up to 18.7 x 18.5 x 17.8 cm, infiltrating and encasing multiple distal small bowel loops and likely the ileocecal region, partially encasing the sigmoid  colon, with prominent extension into the right lower retroperitoneum and extraperitoneal right pelvis with encasement of right external iliac and proximal right common iliac vasculature and infiltration of the right iliopsoas muscle. 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 Differential includes small bowel GIST or adenocarcinoma. 2. Mild right hydronephrosis due to thick soft tissue encasing the right ureter most prominently at the right UPJ and right UVJ. 3. Nonobstructing right nephrolithiasis. 4. Moderate sigmoid diverticulosis. 5.  Aortic Atherosclerosis (ICD10-I70.0). Critical Value/emergent results were called by telephone at the time of interpretation on 03/10/2018 at 5:38 pm to Dr. Kennith Maes , who verbally acknowledged these results. Electronically Signed   By: Ilona Sorrel M.D.   On: 03/10/2018 17:41    Pending Labs Unresulted Labs (From admission, onward)   Start     Ordered   03/11/18 0500  Vitamin B12  (Anemia Panel (PNL))  Tomorrow morning,   R     03/10/18 1922   03/11/18 0500  Folate  (Anemia Panel (PNL))  Tomorrow morning,   R     03/10/18 1922   03/11/18 0500  Iron and TIBC  (Anemia Panel (PNL))  Tomorrow morning,   R     03/10/18 1922   03/11/18 0500  Ferritin  (Anemia Panel (PNL))  Tomorrow morning,   R     03/10/18 1922   03/11/18 0500  Reticulocytes  (Anemia Panel (PNL))  Tomorrow morning,   R     03/10/18 1922   03/11/18 0500  CBC  Daily,   R     03/10/18 1926   03/11/18 0500  Heparin level (unfractionated)  Daily,   R     03/10/18 2049   03/11/18 0200  APTT  Once-Timed,   R     03/10/18 1926   03/10/18 1956  Type and screen [place]  Once,   R    Comments:  [place]    03/10/18 1955   03/10/18 1914  Troponin I (q 6hr x 3)  Now then every 6 hours,   R     03/10/18 1913   03/10/18 1847  Heparin level (unfractionated)  STAT,   R     03/10/18 1846   Signed and Held  Hemoglobin A1c  Once,   R    Comments:  To assess prior  glycemic control    Signed and Held   Signed and Held  CBC  Now then every 8 hours,   R     Signed and Held   Signed and Held  HIV antibody (Routine Testing)  Tomorrow morning,   R     Signed and Held   Signed and Held  Magnesium  Tomorrow morning,   R    Comments:  Call MD if <1.5    Signed and Held   Signed and Held  Phosphorus  Tomorrow morning,   R     Signed and Held   Signed and Held  TSH  Once,   R    Comments:  Cancel if already done within 1 month and notify  MD    Signed and Held   Signed and Held  Comprehensive metabolic panel  Once,   R    Comments:  Cal MD for K<3.5 or >5.0    Signed and Held   Signed and Held  CBC  Once,   R    Comments:  Call for hg <8.0    Signed and Held      Vitals/Pain Today's Vitals   03/10/18 1813 03/10/18 1930 03/10/18 2000 03/10/18 2030  BP: (!) 154/65 (!) 154/87 (!) 164/78 136/69  Pulse: (!) 114 (!) 109 (!) 109 (!) 103  Resp: 19 13 19 18   Temp:      TempSrc:      SpO2: 94% 96% 95% 92%  Weight:      Height:      PainSc:        Isolation Precautions No active isolations  Medications Medications  heparin ADULT infusion 100 units/mL (25000 units/257m sodium chloride 0.45%) (1,600 Units/hr Intravenous New Bag/Given 03/10/18 1917)  iopamidol (ISOVUE-370) 76 % injection (100 mLs  Contrast Given 03/10/18 1639)  heparin bolus via infusion 5,000 Units (5,000 Units Intravenous Bolus from Bag 03/10/18 1917)    Mobility walks

## 2018-03-10 NOTE — ED Notes (Signed)
Bed: WA08 Expected date:  Expected time:  Means of arrival:  Comments: Hold for triage

## 2018-03-10 NOTE — ED Provider Notes (Signed)
[facility] DEPT Provider Note   CSN: [id number] Arrival date & time: 03/10/18  1205     History   Chief Complaint Chief Complaint  Patient presents with  . DVT    right leg    HPI Mark Frey is a 62 y.o. male with a hx of T2DM, hyperlipidemia, morbid obesity, IBS, and tachycardia who presents to the ED from ultrasound for confirmed right lower extremity DVT today.  Patient states he has had right leg swelling which has been somewhat progressively worsening over the past 1.5 weeks.  He states the leg feels somewhat sore at times but is not really painful, rates pain a 0/10 in severity. He has not had alleviating/aggravating factors to his sxs. He was seen by PCP yesterday- PCP started him on Xarelto prophylactically for DVT and sent him for [place] today. LE venous study revealed acute DVT involving the R saphenofemoral junction, right common femoral vein, and posterior tibial, and peroneal veins. External/commmon iliac are patent. Incidental findings of a large, vascularized, heterogenous area of the right lower quadrant, immeasurable. Patient denies abdomina pain, has not previously noticed this mass. Patient denies dyspnea, chest pain, fever, numbness, weakness, or color change.   HPI  Past Medical History:  Diagnosis Date  . ALLERGIC RHINITIS   . Diabetes mellitus   . Hyperlipidemia     Patient Active Problem List   Diagnosis Date Noted  . Tachycardia 01/31/2017  . Controlled diabetes mellitus type II without complication ([place]) 69/67/8938  . Hypertension 06/05/2014  . Irritable bowel syndrome 03/30/2010  . Morbid obesity ([place]) 12/29/2009  . Dysmetabolic syndrome X 06/29/5101  . BACK PAIN WITH RADICULOPATHY 06/09/2007  . Hyperlipidemia 05/12/2007  . ALLERGIC RHINITIS 05/12/2007    Past Surgical History:  Procedure Laterality Date  . CLEFT PALATE REPAIR    . deviated septum repair     slight improvement  . TONSILLECTOMY           Home Medications    Prior to Admission medications   Medication Sig Start Date End Date Taking? Authorizing Provider  aspirin 81 MG tablet Take 81 mg by mouth daily.    [provider]  cetirizine (ZYRTEC) 10 MG tablet Take 10 mg by mouth 2 (two) times daily as needed.      [provider]  Choline Fenofibrate 135 MG capsule Take 1 capsule (135 mg total) by mouth daily. 08/02/17   [name], MD  Choline Fenofibrate 135 MG capsule TAKE 1 CAPSULE DAILY 11/29/17   [name], MD  fish oil-omega-3 fatty acids 1000 MG capsule Take 1 g by mouth 3 (three) times daily.      [provider]  fluticasone (FLONASE) 50 MCG/ACT nasal spray 1 spray by Nasal route as needed.      [provider]  glimepiride (AMARYL) 2 MG tablet Take 1 tablet (2 mg total) by mouth daily before breakfast. 12/28/17   [name], MD  glucosamine-chondroitin 500-400 MG tablet Take 1 tablet by mouth daily.      [provider]  glucose blood (FREESTYLE TEST STRIPS) test strip 1 each by Other route daily. Use as instructed     [provider]  JANUMET 50-1000 MG tablet TAKE 1 TABLET TWICE DAILY  WITH MEALS 11/29/17   [name], MD  Lancets (FREESTYLE) lancets 1 each by Other route as directed. Use as instructed     [provider]  Multiple Vitamin (MULTIVITAMIN) capsule Take  1 capsule by mouth daily.      [provider]  sitaGLIPtin-metformin (JANUMET) 50-1000 MG tablet Take 1 tablet by mouth 2 (two) times daily with a meal. 08/02/17   [name], MD    Family History Family History  Problem Relation Age of Onset  . Lung cancer Mother        smoker  . Brain cancer Mother        metastasis  . AAA (abdominal aortic aneurysm) Father        smoker    Social History Social History   Tobacco Use  . Smoking status: Never Smoker  . Smokeless tobacco: Never Used  Substance Use Topics  . Alcohol use: Yes     Comment: occasional  . Drug use: No     Allergies   Ciprofloxacin   Review of Systems Review of Systems  Constitutional: Negative for chills and fever.  Respiratory: Negative for shortness of breath.   Cardiovascular: Positive for leg swelling. Negative for chest pain.  Gastrointestinal: Negative for abdominal pain, diarrhea, nausea and vomiting.  Skin: Negative for color change.  Neurological: Negative for weakness and numbness.  All other systems reviewed and are negative.  Physical Exam Updated Vital Signs BP (!) 153/91   Pulse (!) 110   Temp 98 F (36.7 C) (Oral)   Resp 15   Ht 5' 9"  (1.753 m)   Wt 117.9 kg (260 lb)   SpO2 97%   BMI 38.40 kg/m   Physical Exam  Constitutional: He appears well-developed and well-nourished.  Non-toxic appearance. No distress.  HENT:  Head: Normocephalic and atraumatic.  Eyes: Conjunctivae are normal. Right eye exhibits no discharge. Left eye exhibits no discharge.  Cardiovascular: Regular rhythm. Tachycardia present.  No murmur heard. Pulses:      Dorsalis pedis pulses are 2+ on the right side, and 2+ on the left side.       Posterior tibial pulses are 2+ on the right side, and 2+ on the left side.  Pulmonary/Chest: Effort normal and breath sounds normal. No respiratory distress. He has no wheezes. He has no rhonchi. He has no rales.  Abdominal: Soft. He exhibits mass (Palpable mass in the right lower quadrant, nonpulsatile nontender to palpation). He exhibits no distension. There is no tenderness. There is no rigidity, no rebound and no guarding.  Genitourinary: Rectal exam shows no external hemorrhoid.  Genitourinary Comments: Soft brown stool. EDT present as chaperone.   Musculoskeletal:  Lower extremities: RLE with 3+ pitting edema to lower leg, trace to upper leg. No erythema, no warmth.  LLE: Trace edema to lower leg.  Lower extremities: Normal range of motion to bilateral hips, knees, and ankles.  Nontender to palpation.   Neurological: He is alert.  Clear speech.  Sensation grossly intact bilateral lower extremities.  5 out of 5 strength with plantar dorsiflexion bilaterally.  Skin: Skin is warm and dry. No rash noted.  Psychiatric: He has a normal mood and affect. His behavior is normal.  Nursing note and vitals reviewed.   ED Treatments / Results  Labs Results for orders placed or performed during the hospital encounter of 03/10/18  CBC  Result Value Ref Range   WBC 9.1 4.0 - 10.5 K/uL   RBC 3.20 (L) 4.22 - 5.81 MIL/uL   Hemoglobin 8.5 (L) 13.0 - 17.0 g/dL   HCT 27.6 (L) 39.0 - 52.0 %   MCV 86.3 78.0 - 100.0 fL   MCH 26.6 26.0 - 34.0 pg  MCHC 30.8 30.0 - 36.0 g/dL   RDW 17.3 (H) 11.5 - 15.5 %   Platelets 488 (H) 150 - 400 K/uL  Comprehensive metabolic panel  Result Value Ref Range   Sodium 142 135 - 145 mmol/L   Potassium 3.6 3.5 - 5.1 mmol/L   Chloride 108 98 - 111 mmol/L   CO2 23 22 - 32 mmol/L   Glucose, Bld 53 (L) 70 - 99 mg/dL   BUN 17 8 - 23 mg/dL   Creatinine, Ser 1.11 0.61 - 1.24 mg/dL   Calcium 9.2 8.9 - 10.3 mg/dL   Total Protein 7.3 6.5 - 8.1 g/dL   Albumin 3.3 (L) 3.5 - 5.0 g/dL   AST 21 15 - 41 U/L   ALT 14 0 - 44 U/L   Alkaline Phosphatase 32 (L) 38 - 126 U/L   Total Bilirubin 0.5 0.3 - 1.2 mg/dL   GFR calc non Af Amer >60 >60 mL/min   GFR calc Af Amer >60 >60 mL/min   Anion gap 11 5 - 15  POC occult blood, ED Provider will collect  Result Value Ref Range   Fecal Occult Bld POSITIVE (A) NEGATIVE     EKG None  Radiology Ct Angio Chest Pe W/cm &/or Wo Cm  Result Date: 03/10/2018 CLINICAL DATA:  Acute DVT in the right lower extremity by report. Palpable right abdominal mass. EXAM: CT ANGIOGRAPHY CHEST CT ABDOMEN AND PELVIS WITH CONTRAST TECHNIQUE: Multidetector CT imaging of the chest was performed using the standard protocol during bolus administration of intravenous contrast. Multiplanar CT image reconstructions and MIPs were obtained to evaluate the vascular  anatomy. Multidetector CT imaging of the abdomen and pelvis was performed using the standard protocol during bolus administration of intravenous contrast. CONTRAST:  142m ISOVUE-370 IOPAMIDOL (ISOVUE-370) INJECTION 76% COMPARISON:  None. FINDINGS: CTA CHEST FINDINGS Cardiovascular: The study is moderate quality for the evaluation of pulmonary embolism, limited by motion degradation particularly in the lower lungs. There are lobar, segmental and subsegmental pulmonary emboli in the left upper, left lower and right upper lobes. There are segmental and subsegmental pulmonary emboli in the right lower lobe. No central or saddle pulmonary emboli. Dilated main pulmonary artery (3.5 cm diameter). Normal course and caliber of thoracic aorta. Top-normal heart size. Elevated RV/LV ratio 1.1. No significant pericardial fluid/thickening. Left anterior descending coronary atherosclerosis. Mediastinum/Nodes: No discrete thyroid nodules. Unremarkable esophagus. No pathologically enlarged axillary, mediastinal or hilar lymph nodes. Lungs/Pleura: No pneumothorax. No pleural effusion. Subpleural 2 mm basilar left lower lobe solid pulmonary nodule (series 4/image 122). No acute consolidative airspace disease, lung masses or additional significant pulmonary nodules. Musculoskeletal: No aggressive appearing focal osseous lesions. Moderate thoracic spondylosis. Review of the MIP images confirms the above findings. CT ABDOMEN and PELVIS FINDINGS Hepatobiliary: Normal liver size. Solitary subcentimeter hypodense right liver dome lesion (series 4/image 11), too small to characterize. No additional liver lesions. Normal gallbladder with no radiopaque cholelithiasis. No biliary ductal dilatation. Pancreas: Normal, with no mass or duct dilation. Spleen: Normal size. No mass. Adrenals/Urinary Tract: Normal adrenals. Mild right hydronephrosis with ill-defined soft tissue encasing the right ureteropelvic junction. Nonobstructing 5 mm stone in  the posterior lower right kidney. No additional renal stones. No left hydronephrosis. A few subcentimeter hypodense renal cortical lesions scattered in the kidneys bilaterally, too small to characterize. Normal caliber left ureter. Masslike wall thickening in the distal pelvic segment of the right ureter extending to the right ureterovesical junction measuring up to 2.5 x 1.9 x 5.0 cm (series  4/image 72). Otherwise normal nondistended bladder. Stomach/Bowel: Normal non-distended stomach. There is a large irregular infiltrative solid mass in the right lower quadrant measuring up to 18.7 x 18.5 x 17.8 cm (series 4/image 57), which infiltrates and encases multiple distal small bowel loops and likely the ileocecal region. There is partial encasement of the sigmoid colon by the mass. There is prominent extension of the mass into the right lower retroperitoneum and extraperitoneal right pelvis with encasement of the right external iliac and proximal right common iliac vasculature and with infiltration of the right iliopsoas muscle. There is dilatation of the involved right sided small bowel loops. No proximal small bowel dilatation. The transverse and left colon are collapsed with no wall thickening in these locations. Moderate sigmoid diverticulosis. Vascular/Lymphatic: Atherosclerotic nonaneurysmal abdominal aorta. Patent portal, splenic, hepatic and renal veins. There is bulky soft tissue throughout right external and common iliac nodal chains. Reproductive: Top-normal size prostate with coarse nonspecific internal prostatic calcifications. Other: No pneumoperitoneum, ascites or focal fluid collection. Trace presacral fluid. Asymmetric fat stranding throughout subcutaneous right proximal lower extremity and right flank. Musculoskeletal: No aggressive appearing focal osseous lesions. Moderate lumbar spondylosis. Review of the MIP images confirms the above findings. IMPRESSION: CT CHEST: 1. Acute bilateral pulmonary  embolism involving lobar, segmental and subsegmental branches. No saddle pulmonary emboli. Positive for acute PE with CT evidence of right heart strain (RV/LV Ratio = 1.1) consistent with at least submassive (intermediate risk) PE. The presence of right heart strain has been associated with an increased risk of morbidity and mortality. Please activate Code PE by paging [phone]. 2. Dilated main pulmonary artery, suggesting pulmonary arterial hypertension. 3. 1 vessel coronary atherosclerosis. 4. No findings suspicious for metastatic disease in the chest. Solitary 2 mm subpleural left lower lobe solid pulmonary nodule, for which follow-up chest CT is advised in 3 months. CT ABDOMEN and PELVIS: 1. Large irregular infiltrative solid mass in the right lower quadrant measuring up to 18.7 x 18.5 x 17.8 cm, infiltrating and encasing multiple distal small bowel loops and likely the ileocecal region, partially encasing the sigmoid colon, with prominent extension into the right lower retroperitoneum and extraperitoneal right pelvis with encasement of right external iliac and proximal right common iliac vasculature and infiltration of the right iliopsoas muscle. 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 Differential includes small bowel GIST or adenocarcinoma. 2. Mild right hydronephrosis due to thick soft tissue encasing the right ureter most prominently at the right UPJ and right UVJ. 3. Nonobstructing right nephrolithiasis. 4. Moderate sigmoid diverticulosis. 5.  Aortic Atherosclerosis (ICD10-I70.0). Critical Value/emergent results were called by telephone at the time of interpretation on 03/10/2018 at 5:38 pm to Dr. Kennith Maes , who verbally acknowledged these results. Electronically Signed   By: Ilona Sorrel M.D.   On: 03/10/2018 17:41   Ct Abdomen Pelvis W Contrast  Result Date: 03/10/2018 CLINICAL DATA:  Acute DVT in the right lower extremity by report. Palpable right abdominal mass. EXAM: CT ANGIOGRAPHY CHEST CT  ABDOMEN AND PELVIS WITH CONTRAST TECHNIQUE: Multidetector CT imaging of the chest was performed using the standard protocol during bolus administration of intravenous contrast. Multiplanar CT image reconstructions and MIPs were obtained to evaluate the vascular anatomy. Multidetector CT imaging of the abdomen and pelvis was performed using the standard protocol during bolus administration of intravenous contrast. CONTRAST:  148m ISOVUE-370 IOPAMIDOL (ISOVUE-370) INJECTION 76% COMPARISON:  None. FINDINGS: CTA CHEST FINDINGS Cardiovascular: The study is moderate quality for the evaluation of pulmonary embolism, limited by motion degradation particularly  in the lower lungs. There are lobar, segmental and subsegmental pulmonary emboli in the left upper, left lower and right upper lobes. There are segmental and subsegmental pulmonary emboli in the right lower lobe. No central or saddle pulmonary emboli. Dilated main pulmonary artery (3.5 cm diameter). Normal course and caliber of thoracic aorta. Top-normal heart size. Elevated RV/LV ratio 1.1. No significant pericardial fluid/thickening. Left anterior descending coronary atherosclerosis. Mediastinum/Nodes: No discrete thyroid nodules. Unremarkable esophagus. No pathologically enlarged axillary, mediastinal or hilar lymph nodes. Lungs/Pleura: No pneumothorax. No pleural effusion. Subpleural 2 mm basilar left lower lobe solid pulmonary nodule (series 4/image 122). No acute consolidative airspace disease, lung masses or additional significant pulmonary nodules. Musculoskeletal: No aggressive appearing focal osseous lesions. Moderate thoracic spondylosis. Review of the MIP images confirms the above findings. CT ABDOMEN and PELVIS FINDINGS Hepatobiliary: Normal liver size. Solitary subcentimeter hypodense right liver dome lesion (series 4/image 11), too small to characterize. No additional liver lesions. Normal gallbladder with no radiopaque cholelithiasis. No biliary ductal  dilatation. Pancreas: Normal, with no mass or duct dilation. Spleen: Normal size. No mass. Adrenals/Urinary Tract: Normal adrenals. Mild right hydronephrosis with ill-defined soft tissue encasing the right ureteropelvic junction. Nonobstructing 5 mm stone in the posterior lower right kidney. No additional renal stones. No left hydronephrosis. A few subcentimeter hypodense renal cortical lesions scattered in the kidneys bilaterally, too small to characterize. Normal caliber left ureter. Masslike wall thickening in the distal pelvic segment of the right ureter extending to the right ureterovesical junction measuring up to 2.5 x 1.9 x 5.0 cm (series 4/image 72). Otherwise normal nondistended bladder. Stomach/Bowel: Normal non-distended stomach. There is a large irregular infiltrative solid mass in the right lower quadrant measuring up to 18.7 x 18.5 x 17.8 cm (series 4/image 57), which infiltrates and encases multiple distal small bowel loops and likely the ileocecal region. There is partial encasement of the sigmoid colon by the mass. There is prominent extension of the mass into the right lower retroperitoneum and extraperitoneal right pelvis with encasement of the right external iliac and proximal right common iliac vasculature and with infiltration of the right iliopsoas muscle. There is dilatation of the involved right sided small bowel loops. No proximal small bowel dilatation. The transverse and left colon are collapsed with no wall thickening in these locations. Moderate sigmoid diverticulosis. Vascular/Lymphatic: Atherosclerotic nonaneurysmal abdominal aorta. Patent portal, splenic, hepatic and renal veins. There is bulky soft tissue throughout right external and common iliac nodal chains. Reproductive: Top-normal size prostate with coarse nonspecific internal prostatic calcifications. Other: No pneumoperitoneum, ascites or focal fluid collection. Trace presacral fluid. Asymmetric fat stranding throughout  subcutaneous right proximal lower extremity and right flank. Musculoskeletal: No aggressive appearing focal osseous lesions. Moderate lumbar spondylosis. Review of the MIP images confirms the above findings. IMPRESSION: CT CHEST: 1. Acute bilateral pulmonary embolism involving lobar, segmental and subsegmental branches. No saddle pulmonary emboli. Positive for acute PE with CT evidence of right heart strain (RV/LV Ratio = 1.1) consistent with at least submassive (intermediate risk) PE. The presence of right heart strain has been associated with an increased risk of morbidity and mortality. Please activate Code PE by paging [phone]. 2. Dilated main pulmonary artery, suggesting pulmonary arterial hypertension. 3. 1 vessel coronary atherosclerosis. 4. No findings suspicious for metastatic disease in the chest. Solitary 2 mm subpleural left lower lobe solid pulmonary nodule, for which follow-up chest CT is advised in 3 months. CT ABDOMEN and PELVIS: 1. Large irregular infiltrative solid mass in the right lower quadrant  measuring up to 18.7 x 18.5 x 17.8 cm, infiltrating and encasing multiple distal small bowel loops and likely the ileocecal region, partially encasing the sigmoid colon, with prominent extension into the right lower retroperitoneum and extraperitoneal right pelvis with encasement of right external iliac and proximal right common iliac vasculature and infiltration of the right iliopsoas muscle. 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 Differential includes small bowel GIST or adenocarcinoma. 2. Mild right hydronephrosis due to thick soft tissue encasing the right ureter most prominently at the right UPJ and right UVJ. 3. Nonobstructing right nephrolithiasis. 4. Moderate sigmoid diverticulosis. 5.  Aortic Atherosclerosis (ICD10-I70.0). Critical Value/emergent results were called by telephone at the time of interpretation on 03/10/2018 at 5:38 pm to Dr. Kennith Maes , who verbally acknowledged these  results. Electronically Signed   By: Ilona Sorrel M.D.   On: 03/10/2018 17:41    Procedures Procedures (including critical care time)  CRITICAL CARE Performed by: Kennith Maes   Total critical care time: 50 minutes  Critical care time was exclusive of separately billable procedures and treating other patients.  Critical care was necessary to treat or prevent imminent or life-threatening deterioration.  Critical care was time spent personally by me on the following activities: development of treatment plan with patient and/or surrogate as well as nursing, discussions with consultants, evaluation of patient's response to treatment, examination of patient, obtaining history from patient or surrogate, ordering and performing treatments and interventions, ordering and review of laboratory studies, ordering and review of radiographic studies, pulse oximetry and re-evaluation of patient's condition.  Medications Ordered in ED Medications - No data to display   Initial Impression / Assessment and Plan / ED Course  I have reviewed the triage vital signs and the nursing notes.  Pertinent labs & imaging results that were available during my care of the patient were reviewed by me and considered in my medical decision making (see chart for details).   Patient presents from ultrasound for positive DVT with ultrasound findings concerning for right lower abdominal mass.  Patient nontoxic-appearing, no apparent distress, resting comfortably, initial vitals notable for hypertension and tachycardia, doubt hypertensive emergency.  Regarding patient's DVT, he has palpable pulses, no neurologic deficits.  Will obtain CTA to evaluate for pulmonary embolism, will also obtain CT abdomen pelvis with contrast to further evaluate abdominal mass.  Labs notable for hypoglycemia, RN previously has provided patient with sandwich.  Patient's hemoglobin is 8.5, this is decreased from 13.4 six months prior.  I  discussed with patient and he thinks he may have had some blood in his stool a few days ago, this is not having any other times.  He has increased his NSAID use over the past 2 weeks, 600 mg every 6 hours.  No increase in alcohol. Fecal occult blood obtained and subsequently positive. CT scan of the chest notable for acute bilateral pulmonary embolisms (segmental and subsegmental) w/ R heart strain. 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   Given patient with acute pulmonary emboli with R heart strain, however also with hgb of 8.5 with fecal occult positive blood, will hold off on heparin until dicussed with critical care.   18:22: CONSULT: Discussed case with intensivist Dr. Debbora Dus - recommends starting heparin, patient not a candidate for more invasive intervention, close monitoring of hemoglobin/hematocrit, he does not feel patient requires ICU admission, recommends hospitalist admission.   Will start heparin and page hospitalist.  18:46: CONSULT: Discussed case with hospitalist Dr. Roel Cluck who accepts admission.  Findings and plan of care discussed with supervising physician Dr. Alvino Chapel who is in agreement with plan.   Final Clinical Impressions(s) / ED Diagnoses   Final diagnoses:  Bilateral pulmonary embolism (HCC)  Acute deep vein thrombosis (DVT) of femoral vein of right lower extremity [facility])  Right lower quadrant abdominal mass    ED Discharge Orders    None       Leafy Kindle 03/10/18 1951    Dorie Rank, MD 03/11/18 1624

## 2018-03-10 NOTE — ED Notes (Signed)
BGL 56, given sandwhich and ginger ale.

## 2018-03-10 NOTE — Progress Notes (Signed)
ANTICOAGULATION CONSULT NOTE - Initial Consult  Pharmacy Consult for heparin Indication: pulmonary embolus  Allergies  Allergen Reactions  . Ciprofloxacin Other (See Comments)    Leg tingling    Patient Measurements: Height: 5' 9"  (175.3 cm) Weight: 260 lb (117.9 kg) IBW/kg (Calculated) : 70.7 Heparin Dosing Weight: 97.2 kg  Vital Signs: Temp: 98 F (36.7 C) (06/28 1213) Temp Source: Oral (06/28 1213) BP: 154/65 (06/28 1813) Pulse Rate: 114 (06/28 1813)  Labs: Recent Labs    03/10/18 1530  HGB 8.5*  HCT 27.6*  PLT 488*  CREATININE 1.11    Estimated Creatinine Clearance: 88.6 mL/min (by C-G formula based on SCr of 1.11 mg/dL).   Medical History: Past Medical History:  Diagnosis Date  . ALLERGIC RHINITIS   . Diabetes mellitus   . Hyperlipidemia     Medications:  Scheduled:  Infusions:   Assessment: 89 yoM presents to ED on 6/28 with suspected DVT.  He was seen by his PCP on 6/27 with suspected DVT/PE and was started on Xarelto pending ultrasound results. 6/28 CT:  Acute bilateral PE with CT evidence of right heart strain  6/28 Venous duplex:  Right lower extremity is positive for acute DVT.  Pharmacy is consulted for heparin IV.  No thrombolytics planned at this time. Discussed hemoccult positive and anemia results with Dr. Tamera Punt, [place] to proceed with full dose heparin.  PTA Xarelto 15 mg PO BID with last dose taken on 6/28 at 0730.  Baseline coags: INR 1.5, APTT 32, HL pending SCr 1.11 CBC: Hgb 8.5 (baseline Hgb 14.4 on 08/29/17), Plt 488 FOB +, no active bleeding noted.  Pt reports increase Ibuprofen use recently.  Goal of Therapy:  Heparin level 0.3-0.7 units/ml Monitor platelets by anticoagulation protocol: Yes   Plan:   Baseline PTT, PT/INR, heparin level  Give heparin 5000 units bolus IV x 1 (start ~ 12 hours after last Xarelto dose)  Start heparin IV infusion at 1600 units/hr  APTT 6 hours after starting - use APTT for heparin dose  adjustment.  HL falsely elevated d/t recent Xarelto use.  Daily heparin level and CBC   Gretta Arab PharmD, BCPS Pager [phone] 03/10/2018 6:36 PM

## 2018-03-10 NOTE — ED Notes (Signed)
BGL 102

## 2018-03-11 ENCOUNTER — Inpatient Hospital Stay (HOSPITAL_COMMUNITY): Payer: BLUE CROSS/BLUE SHIELD

## 2018-03-11 DIAGNOSIS — I82401 Acute embolism and thrombosis of unspecified deep veins of right lower extremity: Secondary | ICD-10-CM

## 2018-03-11 DIAGNOSIS — D494 Neoplasm of unspecified behavior of bladder: Secondary | ICD-10-CM

## 2018-03-11 DIAGNOSIS — I5030 Unspecified diastolic (congestive) heart failure: Secondary | ICD-10-CM

## 2018-03-11 DIAGNOSIS — D649 Anemia, unspecified: Secondary | ICD-10-CM

## 2018-03-11 DIAGNOSIS — I2699 Other pulmonary embolism without acute cor pulmonale: Principal | ICD-10-CM

## 2018-03-11 DIAGNOSIS — R1900 Intra-abdominal and pelvic swelling, mass and lump, unspecified site: Secondary | ICD-10-CM

## 2018-03-11 LAB — VITAMIN B12: Vitamin B-12: 413 pg/mL (ref 180–914)

## 2018-03-11 LAB — CBC
HCT: 24.7 % — ABNORMAL LOW (ref 39.0–52.0)
HCT: 27.4 % — ABNORMAL LOW (ref 39.0–52.0)
HEMATOCRIT: 26.5 % — AB (ref 39.0–52.0)
HEMOGLOBIN: 7.5 g/dL — AB (ref 13.0–17.0)
HEMOGLOBIN: 8.2 g/dL — AB (ref 13.0–17.0)
HEMOGLOBIN: 8.4 g/dL — AB (ref 13.0–17.0)
MCH: 26 pg (ref 26.0–34.0)
MCH: 26.7 pg (ref 26.0–34.0)
MCH: 26.5 pg (ref 26.0–34.0)
MCHC: 30.4 g/dL (ref 30.0–36.0)
MCHC: 30.9 g/dL (ref 30.0–36.0)
MCHC: 30.7 g/dL (ref 30.0–36.0)
MCV: 85.8 fL (ref 78.0–100.0)
MCV: 86.3 fL (ref 78.0–100.0)
MCV: 86.4 fL (ref 78.0–100.0)
PLATELETS: 529 10*3/uL — AB (ref 150–400)
Platelets: 442 10*3/uL — ABNORMAL HIGH (ref 150–400)
Platelets: 476 10*3/uL — ABNORMAL HIGH (ref 150–400)
RBC: 2.88 MIL/uL — ABNORMAL LOW (ref 4.22–5.81)
RBC: 3.07 MIL/uL — ABNORMAL LOW (ref 4.22–5.81)
RBC: 3.17 MIL/uL — AB (ref 4.22–5.81)
RDW: 17.3 % — ABNORMAL HIGH (ref 11.5–15.5)
RDW: 17.4 % — AB (ref 11.5–15.5)
RDW: 17.3 % — ABNORMAL HIGH (ref 11.5–15.5)
WBC: 7.6 10*3/uL (ref 4.0–10.5)
WBC: 7.6 10*3/uL (ref 4.0–10.5)
WBC: 8.1 10*3/uL (ref 4.0–10.5)

## 2018-03-11 LAB — COMPREHENSIVE METABOLIC PANEL
ALBUMIN: 2.8 g/dL — AB (ref 3.5–5.0)
ALT: 15 U/L (ref 0–44)
ANION GAP: 10 (ref 5–15)
AST: 19 U/L (ref 15–41)
Alkaline Phosphatase: 29 U/L — ABNORMAL LOW (ref 38–126)
BILIRUBIN TOTAL: 0.3 mg/dL (ref 0.3–1.2)
BUN: 15 mg/dL (ref 8–23)
CALCIUM: 8.9 mg/dL (ref 8.9–10.3)
CHLORIDE: 109 mmol/L (ref 98–111)
CO2: 22 mmol/L (ref 22–32)
CREATININE: 1.03 mg/dL (ref 0.61–1.24)
GFR calc Af Amer: >60 mL/min (ref >60)
GFR calc non Af Amer: >60 mL/min (ref >60)
GLUCOSE: 74 mg/dL (ref 70–99)
POTASSIUM: 3.6 mmol/L (ref 3.5–5.1)
SODIUM: 141 mmol/L (ref 135–145)
Total Protein: 6.5 g/dL (ref 6.5–8.1)

## 2018-03-11 LAB — APTT
APTT: 38 s — AB (ref 24–36)
aPTT: 37 seconds — ABNORMAL HIGH (ref 24–36)
aPTT: 46 seconds — ABNORMAL HIGH (ref 24–36)
aPTT: 41 seconds — ABNORMAL HIGH (ref 24–36)

## 2018-03-11 LAB — IRON AND TIBC
IRON: 25 ug/dL — AB (ref 45–182)
Saturation Ratios: 7 % — ABNORMAL LOW (ref 17.9–39.5)
TIBC: 368 ug/dL (ref 250–450)
UIBC: 343 ug/dL

## 2018-03-11 LAB — ECHOCARDIOGRAM COMPLETE
HEIGHTINCHES: 69 in
Weight: 4160 oz

## 2018-03-11 LAB — HEMOGLOBIN A1C
Hgb A1c MFr Bld: 5.4 % (ref 4.8–5.6)
MEAN PLASMA GLUCOSE: 108.28 mg/dL

## 2018-03-11 LAB — TROPONIN I: Troponin I: <0.03 ng/mL (ref <0.03)

## 2018-03-11 LAB — GLUCOSE, CAPILLARY
GLUCOSE-CAPILLARY: 90 mg/dL (ref 70–99)
GLUCOSE-CAPILLARY: 78 mg/dL (ref 70–99)
Glucose-Capillary: 76 mg/dL (ref 70–99)
Glucose-Capillary: 119 mg/dL — ABNORMAL HIGH (ref 70–99)
Glucose-Capillary: 93 mg/dL (ref 70–99)

## 2018-03-11 LAB — HIV ANTIBODY (ROUTINE TESTING W REFLEX): HIV Screen 4th Generation wRfx: NONREACTIVE

## 2018-03-11 LAB — RETICULOCYTES
RBC.: 2.88 MIL/uL — ABNORMAL LOW (ref 4.22–5.81)
RETIC COUNT ABSOLUTE: 123.8 10*3/uL (ref 19.0–186.0)
Retic Ct Pct: 4.3 % — ABNORMAL HIGH (ref 0.4–3.1)

## 2018-03-11 LAB — HEPARIN LEVEL (UNFRACTIONATED): Heparin Unfractionated: 1.06 IU/mL — ABNORMAL HIGH (ref 0.30–0.70)

## 2018-03-11 LAB — TSH: TSH: 0.579 u[IU]/mL (ref 0.350–4.500)

## 2018-03-11 LAB — TYPE AND SCREEN
ABO/RH(D): O POS
ANTIBODY SCREEN: NEGATIVE

## 2018-03-11 LAB — MAGNESIUM: MAGNESIUM: 1.7 mg/dL (ref 1.7–2.4)

## 2018-03-11 LAB — PHOSPHORUS: PHOSPHORUS: 3.1 mg/dL (ref 2.5–4.6)

## 2018-03-11 LAB — FOLATE: FOLATE: 6.8 ng/mL (ref >5.9)

## 2018-03-11 LAB — ABO/RH: ABO/RH(D): O POS

## 2018-03-11 LAB — FERRITIN: Ferritin: 90 ng/mL (ref 24–336)

## 2018-03-11 MED ORDER — HEPARIN (PORCINE) IN NACL 100-0.45 UNIT/ML-% IJ SOLN
2400.0000 [IU]/h | INTRAMUSCULAR | Status: DC
  Administered 2018-03-11: 2400 [IU]/h via INTRAVENOUS
  Filled 2018-03-11: qty 250

## 2018-03-11 MED ORDER — HEPARIN (PORCINE) IN NACL 100-0.45 UNIT/ML-% IJ SOLN: 2100.0000 [IU]/h | INTRAMUSCULAR | Status: DC

## 2018-03-11 MED ORDER — HEPARIN (PORCINE) IN NACL 100-0.45 UNIT/ML-% IJ SOLN
1900.0000 [IU]/h | INTRAMUSCULAR | Status: DC
  Administered 2018-03-11 (×2): 1900 [IU]/h via INTRAVENOUS
  Filled 2018-03-11: qty 250

## 2018-03-11 MED ORDER — HYDRALAZINE HCL 20 MG/ML IJ SOLN: 5.0000 mg | Freq: Four times a day (QID) | INTRAMUSCULAR | Status: DC | PRN

## 2018-03-11 MED ORDER — HEPARIN BOLUS VIA INFUSION
3000.0000 [IU] | Freq: Once | INTRAVENOUS | Status: AC
  Administered 2018-03-11: 3000 [IU] via INTRAVENOUS
  Filled 2018-03-11: qty 3000

## 2018-03-11 NOTE — Progress Notes (Signed)
IR aware of request for abdominal mass biopsy.  Will work towards procedure based on medical status and as schedule allows- likely early next week.   Brynda Greathouse, MS RD PA-C 2:33 PM

## 2018-03-11 NOTE — Progress Notes (Signed)
[place] for heparin Indication: pulmonary embolus  Allergies  Allergen Reactions  . Ciprofloxacin Other (See Comments)    Leg tingling    Patient Measurements: Height: 5' 9"  (175.3 cm) Weight: 260 lb (117.9 kg) IBW/kg (Calculated) : 70.7 Heparin Dosing Weight: 97.2 kg  Vital Signs: Temp: 98.1 F (36.7 C) (06/29 1200) Temp Source: Oral (06/29 1200) BP: 158/95 (06/29 1631) Pulse Rate: 107 (06/29 1631)  Labs: Recent Labs    03/10/18 1530  03/10/18 1847 03/10/18 1914 03/11/18 0152 03/11/18 0733 03/11/18 0734 03/11/18 1013 03/11/18 1803  HGB 8.5*  --   --   --  7.5* 8.2*  --   --  8.4*  HCT 27.6*  --   --   --  24.7* 26.5*  --   --  27.4*  PLT 488*  --   --   --  442* 476*  --   --  529*  APTT  --    < > 32  --  37*  --  46* 38* 41*  LABPROT  --   --  18.0*  --   --   --   --   --   --   INR  --   --  1.50  --   --   --   --   --   --   HEPARINUNFRC  --   --  1.88*  --  1.06*  --   --   --   --   CREATININE 1.11  --   --   --  1.03  --   --   --   --   TROPONINI  --   --   --  <0.03 <0.03 <0.03  --   --   --    < > = values in this interval not displayed.    Estimated Creatinine Clearance: 95.4 mL/min (by C-G formula based on SCr of 1.03 mg/dL).   Assessment: 12 yoM presents to ED on 6/28 with suspected DVT.  He was seen by his PCP on 6/27 with suspected DVT/PE and was started on Xarelto pending ultrasound results. 6/28 CT:  Acute bilateral PE with CT evidence of right heart strain  6/28 Venous duplex:  Right lower extremity is positive for acute DVT.  Pharmacy is consulted for heparin IV.  No thrombolytics planned at this time. Discussed hemoccult positive and anemia results with Dr. Tamera Punt, [place] to proceed with full dose heparin.  PTA Xarelto 15 mg PO BID with last dose taken on 6/28 at 0730. Patient started on 6/27 per PCP while VTE being ruled out.   Baseline coags: INR 1.5, APTT 32, HL = 1.88 FOB +, no active bleeding  noted.  Pt reports increase Ibuprofen use recently.  03/11/2018: 2nd shift update  APTT remains SUBtherapeutic despite rate increase to 2100 units/hr earlier today  Spoke with RN and no complications or disruptions in infusion noted  CBC: Hgb decreased but stable, pltc elevated  Patient with large intra-abdominal mass.  Seen by oncology this am.  Plans for biopsy (radiology vs surgery - to be determined)  No obvious bleeding complications  Goal of Therapy:  APTT 66-102 seceond , aim for 66 - 85 seconds Heparin level 0.3-0.7 units/ml Monitor platelets by anticoagulation protocol: Yes   Plan:   3000 unit re-bolus and increase drip to 2400 units/hr and check 6 hr aPTT  Use APTT for heparin dose adjustment.  Heparin level falsely elevated d/t recent  Xarelto use.  Daily aPTT, heparin level and CBC  F/u plans for biopsy and need to hold heparin gtt  Leone Haven, PharmD 03/11/2018 6:50 PM

## 2018-03-11 NOTE — Progress Notes (Signed)
*  PRELIMINARY RESULTS* Echocardiogram 2D Echocardiogram has been performed.  Mark Frey 03/11/2018, 9:02 AM

## 2018-03-11 NOTE — Progress Notes (Signed)
[facility] for heparin Indication: pulmonary embolus  Allergies  Allergen Reactions  . Ciprofloxacin Other (See Comments)    Leg tingling    Patient Measurements: Height: 5' 9"  (175.3 cm) Weight: 260 lb (117.9 kg) IBW/kg (Calculated) : 70.7 Heparin Dosing Weight: 97.2 kg  Vital Signs: Temp: 98.7 F (37.1 C) (06/29 0001) Temp Source: Axillary (06/29 0001) BP: 137/53 (06/29 0100) Pulse Rate: 103 (06/28 2030)  Labs: Recent Labs    03/10/18 1530 03/10/18 1847 03/10/18 1914 03/11/18 0152  HGB 8.5*  --   --  7.5*  HCT 27.6*  --   --  24.7*  PLT 488*  --   --  442*  APTT  --  32  --  37*  LABPROT  --  18.0*  --   --   INR  --  1.50  --   --   HEPARINUNFRC  --  1.88*  --   --   CREATININE 1.11  --   --  1.03  TROPONINI  --   --  <0.03 <0.03    Estimated Creatinine Clearance: 95.4 mL/min (by C-G formula based on SCr of 1.03 mg/dL).   Assessment: 42 yoM presents to ED on 6/28 with suspected DVT.  He was seen by his PCP on 6/27 with suspected DVT/PE and was started on Xarelto pending ultrasound results. 6/28 CT:  Acute bilateral PE with CT evidence of right heart strain  6/28 Venous duplex:  Right lower extremity is positive for acute DVT.  Pharmacy is consulted for heparin IV.  No thrombolytics planned at this time. Discussed hemoccult positive and anemia results with Dr. Tamera Punt, [place] to proceed with full dose heparin.  PTA Xarelto 15 mg PO BID with last dose taken on 6/28 at 0730.  Baseline coags: INR 1.5, APTT 32, HL pending SCr 1.11 CBC: Hgb 8.5 (baseline Hgb 14.4 on 08/29/17), Plt 488 FOB +, no active bleeding noted.  Pt reports increase Ibuprofen use recently.  03/11/2018 First aPTT drawn ~ 6 hrs after a 5000 unit bolus and heparin drip at 1600 units/hr - aPTT is below goal of  66-102 with conservative goal of 66-85 seconds.  Hg has dropped from 8.5>7.5.  No bleeding per RN.    Goal of Therapy:  APTT 66-102 seceond , aim for 66  - 85 seconds Heparin level 0.3-0.7 units/ml Monitor platelets by anticoagulation protocol: Yes   Plan:   3000 unit bolus and increase drip to 1900 units/hr and check 6 hr aPTT   use APTT for heparin dose adjustment.  HL falsely elevated d/t recent Xarelto use.  Daily aPTT, heparin level and CBC  Eudelia Bunch, Pharm.D. [phone] 03/11/2018 3:22 AM

## 2018-03-11 NOTE — Consult Note (Signed)
[facility] NOTE  Patient Care Team: [name], MD as PCP - General (Family Medicine)  CHIEF COMPLAINTS/PURPOSE OF CONSULTATION:  DVT, bilateral PE, intra-abdominal tumor  HISTORY OF PRESENTING ILLNESS:  Mark Frey 62 y.o. male is here because of recent diagnosis of right leg DVT and bilateral PEs.  He originally presented with a swollen right leg and went to see his primary care physician who suspected DVT and performed an ultrasound which confirmed the DVT in his right leg.  He was encouraged to go to the emergency room.  By way of ultrasound, a large abdominal mass was identified.  He was sent evaluated in the emergency room and had a CT of his abdomen along with CT of his chest.  CT chest revealed diffuse bilateral PEs.  His abdominal CT showed an extremely large tumor 18.7 x 18.5 x 17.8 cm that is encasing his entire lower abdomen and small bowel loops.  In addition there was also a masslike thickening at the ureterovesical junction measuring 5 cm. Surprisingly he has no abdominal symptoms whatsoever.  He has been eating well.  He has no abdominal pain and no problems going for bowel movements.  He denies any fevers chills night sweats or weight loss. He works in Programmer, applications. I reviewed her records extensively and collaborated the history with the patient.   MEDICAL HISTORY:  Past Medical History:  Diagnosis Date  . ALLERGIC RHINITIS   . Diabetes mellitus   . Hyperlipidemia     SURGICAL HISTORY: Past Surgical History:  Procedure Laterality Date  . CLEFT PALATE REPAIR    . deviated septum repair     slight improvement  . TONSILLECTOMY      SOCIAL HISTORY: Social History   Socioeconomic History  . Marital status: Married    Spouse name: Not on file  . Number of children: Not on file  . Years of education: Not on file  . Highest education level: Not on file  Occupational History  . Not on file  Social Needs  . Financial resource  strain: Not on file  . Food insecurity:    Worry: Not on file    Inability: Not on file  . Transportation needs:    Medical: Not on file    Non-medical: Not on file  Tobacco Use  . Smoking status: Never Smoker  . Smokeless tobacco: Never Used  Substance and Sexual Activity  . Alcohol use: Yes    Comment: occasional  . Drug use: No  . Sexual activity: Yes  Lifestyle  . Physical activity:    Days per week: Not on file    Minutes per session: Not on file  . Stress: Not on file  Relationships  . Social connections:    Talks on phone: Not on file    Gets together: Not on file    Attends religious service: Not on file    Active member of club or organization: Not on file    Attends meetings of clubs or organizations: Not on file    Relationship status: Not on file  . Intimate partner violence:    Fear of current or ex partner: Not on file    Emotionally abused: Not on file    Physically abused: Not on file    Forced sexual activity: Not on file  Other Topics Concern  . Not on file  Social History Narrative   Married 1985. No kids. 4 small dogs.  Works in Financial trader, residential      Hobbies: work on cars, Haematologist, exercise as able    FAMILY HISTORY: Family History  Problem Relation Age of Onset  . Lung cancer Mother        smoker  . Brain cancer Mother        metastasis  . AAA (abdominal aortic aneurysm) Father        smoker    ALLERGIES:  is allergic to ciprofloxacin.  MEDICATIONS:  Current Facility-Administered Medications  Medication Dose Route Frequency Provider Last Rate Last Dose  . acetaminophen (TYLENOL) tablet 650 mg  650 mg Oral Q6H PRN [name], MD       Or  . acetaminophen (TYLENOL) suppository 650 mg  650 mg Rectal Q6H PRN Doutova, Anastassia, MD      . docusate sodium (COLACE) capsule 100 mg  100 mg Oral BID Doutova, Anastassia, MD      . fenofibrate tablet 160 mg  160 mg Oral Daily Doutova, Anastassia, MD   160 mg at  03/11/18 1050  . heparin ADULT infusion 100 units/mL (25000 units/231m sodium chloride 0.45%)  1,900 Units/hr Intravenous Continuous BEudelia Bunch RPH 19 mL/hr at 03/11/18 0540 1,900 Units/hr at 03/11/18 0540  . HYDROcodone-acetaminophen (NORCO/VICODIN) 5-325 MG per tablet 1-2 tablet  1-2 tablet Oral Q4H PRN Doutova, Anastassia, MD      . insulin aspart (novoLOG) injection 0-9 Units  0-9 Units Subcutaneous Q4H Doutova, Anastassia, MD      . ondansetron (ZOFRAN) tablet 4 mg  4 mg Oral Q6H PRN Doutova, Anastassia, MD       Or  . ondansetron (ZOFRAN) injection 4 mg  4 mg Intravenous Q6H PRN Doutova, Anastassia, MD      . polyethylene glycol (MIRALAX / GLYCOLAX) packet 17 g  17 g Oral Daily PRN Doutova, Anastassia, MD        REVIEW OF SYSTEMS:   Constitutional: Denies fevers, chills or abnormal night sweats Eyes: Denies blurriness of vision, double vision or watery eyes Ears, nose, mouth, throat, and face: Denies mucositis or sore throat Respiratory: Denies cough, dyspnea or wheezes Cardiovascular: Denies palpitation, chest discomfort Gastrointestinal:  Denies nausea, heartburn or change in bowel habits Skin: Denies abnormal skin rashes Lymphatics: Denies new lymphadenopathy or easy bruising Neurological:Denies numbness, tingling or new weaknesses Behavioral/Psych: Mood is stable, no new changes  Extremities: Right leg swelling All other systems were reviewed with the patient and are negative.  PHYSICAL EXAMINATION: ECOG PERFORMANCE STATUS: 1 - Symptomatic but completely ambulatory  Vitals:   03/11/18 0700 03/11/18 0800  BP: (!) 143/76   Pulse:    Resp: 18   Temp:  98.2 F (36.8 C)  SpO2: 93%    Filed Weights   03/10/18 1215  Weight: 260 lb (117.9 kg)    GENERAL:alert, no distress and comfortable SKIN: skin color, texture, turgor are normal, no rashes or significant lesions EYES: normal, conjunctiva are pink and non-injected, sclera clear OROPHARYNX:no exudate, no  erythema and lips, buccal mucosa, and tongue normal  NECK: supple, thyroid normal size, non-tender, without nodularity LYMPH:  no palpable lymphadenopathy in the cervical, axillary or inguinal LUNGS: clear to auscultation and percussion with normal breathing effort HEART: regular rate & rhythm and no murmurs and no lower extremity edema ABDOMEN: Very large mass can be palpated extending from the right upper quadrant to the left lower quadrant.  It Is nontender. Musculoskeletal:no cyanosis of digits and no clubbing  PSYCH: alert & oriented x 3 with  fluent speech NEURO: no focal motor/sensory deficits  LABORATORY DATA:  I have reviewed the data as listed Lab Results  Component Value Date   WBC 7.6 03/11/2018   HGB 8.2 (L) 03/11/2018   HCT 26.5 (L) 03/11/2018   MCV 86.3 03/11/2018   PLT 476 (H) 03/11/2018   Lab Results  Component Value Date   NA 141 03/11/2018   K 3.6 03/11/2018   CL 109 03/11/2018   CO2 22 03/11/2018    RADIOGRAPHIC STUDIES: I have personally reviewed the radiological reports and agreed with the findings in the report.  ASSESSMENT AND PLAN:  1.  Extremely large abdominal mass encasing his small bowels: Completely asymptomatic Differential diagnosis is between just versus adenocarcinoma versus lymphoma He needs a biopsy of this mass. I discussed with him that radiology or surgery may be able to get a biopsy.  Once we have tissue diagnosis and we are able to come up with the treatment plan. If this comes back as GIST then we will have Dr.Sherill/ Dr/Feng see him to evaluate for neoadjuvant imatinib therapy prior to surgery.  If this comes back as lymphoma then he will need systemic chemotherapy. If this comes back as adenocarcinoma, he will also need systemic chemotherapy.  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  Severe anemia hemoglobin 8.2, normocytic.  He will need iron studies and B09 and folic acid as well as hemolysis  work-up. We will follow him closely until we have more definite diagnosis.  Once we have the diagnosis established he will be referred to the appropriate medical oncologist for treatment.  All questions were answered. The patient knows to call the clinic with any problems, questions or concerns.    Harriette Ohara, MD @T @

## 2018-03-11 NOTE — Progress Notes (Signed)
PROGRESS NOTE    Mark Frey  BLT:[id number] DOB: [birth year] DOA: 03/10/2018 PCP: [name], MD   Brief Narrative: Mark Frey is a 62 y.o. male with a history of diabetes mellitus that presented secondary to DVT found on ultrasound. He was also found to have a massive bilateral PE in addition to concern for abdominal malignancy on CT scan. Started on heparin IV.   Assessment & Plan:   Active Problems:   Hyperlipidemia   Controlled diabetes mellitus type II without complication (HCC)   Hypertension   Bilateral pulmonary embolism (HCC)   Anemia   Hypoglycemia   Occult blood in stools   Intraabdominal mass   Bilateral pulmonary embolism Involving lobar, segmental and subsegmental branches bilaterally with evidence of heart strain. Not a tPA candidate per PCCM. Started on heparin drip -Transthoracic Echocardiogram pending  DVT Right saphenofemoral junction, right common femoral vein, posterior tibial/peroneal veins. -Management above  Intraabdominal mass Large. Concern for malignancy. Oncology consulted. CT chest without evidence of metastatic disease -IR consult for biopsy  Elevated blood pressure No history of hypertension. Currently still uncontrolled.  -hydralazine prn  Diabetes mellitus, type 2 Hemoglobin A1C of 5.4%. On Janumet and glimepiride as an outpatient. -SSI while inpatient  Heme positive stool No history of colonoscopy -outpatient GI referral   DVT prophylaxis: Heparin gtt Code Status:   Code Status: Full Code Family Communication: None at bedside Disposition Plan: Transfer to telemetry   Consultants:   PCCM  Oncology  IR  Procedures:   None  Antimicrobials:  None    Subjective: No dyspnea or chest pain. No abdominal pain. No leg pain.  Objective: Vitals:   03/11/18 0429 03/11/18 0700 03/11/18 0800 03/11/18 0900  BP:  (!) 143/76 (!) 147/80 (!) 152/84  Pulse:   100   Resp:  18 19 19   Temp: 98.7 F (37.1 C)   98.2 F (36.8 C)   TempSrc: Oral  Oral   SpO2:  93% 96% 95%  Weight:      Height:        Intake/Output Summary (Last 24 hours) at 03/11/2018 1234 Last data filed at 03/11/2018 1100 Gross per 24 hour  Intake 315.18 ml  Output 475 ml  Net -159.82 ml   Filed Weights   03/10/18 1215  Weight: 117.9 kg (260 lb)    Examination:  General exam: Appears calm and comfortable Respiratory system: Clear to auscultation. Respiratory effort normal. Cardiovascular system: S1 & S2 heard, RRR. No murmurs, rubs, gallops or clicks. Gastrointestinal system: Abdomen is nondistended, soft and nontender. Large firm mass felt in RL and RU quadrants.. Normal bowel sounds heard. Central nervous system: Alert and oriented. No focal neurological deficits. Extremities: Mild edema of right leg which is more swollen than left. No calf tenderness Skin: No cyanosis. No rashes Psychiatry: Judgement and insight appear normal. Mood & affect appropriate.     Data Reviewed: I have personally reviewed following labs and imaging studies  CBC: Recent Labs  Lab 03/10/18 1530 03/11/18 0152 03/11/18 0733  WBC 9.1 7.6 7.6  HGB 8.5* 7.5* 8.2*  HCT 27.6* 24.7* 26.5*  MCV 86.3 85.8 86.3  PLT 488* 442* 007*   Basic Metabolic Panel: Recent Labs  Lab 03/10/18 1530 03/11/18 0152  NA 142 141  K 3.6 3.6  CL 108 109  CO2 23 22  GLUCOSE 53* 74  BUN 17 15  CREATININE 1.11 1.03  CALCIUM 9.2 8.9  MG  --  1.7  PHOS  --  3.1   GFR: Estimated Creatinine Clearance: 95.4 mL/min (by C-G formula based on SCr of 1.03 mg/dL). Liver Function Tests: Recent Labs  Lab 03/10/18 1530 03/11/18 0152  AST 21 19  ALT 14 15  ALKPHOS 32* 29*  BILITOT 0.5 0.3  PROT 7.3 6.5  ALBUMIN 3.3* 2.8*   No results for input(s): LIPASE, AMYLASE in the last 168 hours. No results for input(s): AMMONIA in the last 168 hours. Coagulation Profile: Recent Labs  Lab 03/10/18 1847  INR 1.50   Cardiac Enzymes: Recent Labs  Lab  03/10/18 1914 03/11/18 0152 03/11/18 0733  TROPONINI <0.03 <0.03 <0.03   BNP (last 3 results) No results for input(s): PROBNP in the last 8760 hours. HbA1C: Recent Labs    03/10/18 2151  HGBA1C 5.4   CBG: Recent Labs  Lab 03/10/18 2146 03/10/18 2333 03/11/18 0352 03/11/18 0725  GLUCAP 106* 197* 76 90   Lipid Profile: No results for input(s): CHOL, HDL, LDLCALC, TRIG, CHOLHDL, LDLDIRECT in the last 72 hours. Thyroid Function Tests: Recent Labs    03/11/18 0152  TSH 0.579   Anemia Panel: Recent Labs    03/11/18 0152  VITAMINB12 413  FOLATE 6.8  FERRITIN 90  TIBC 368  IRON 25*  RETICCTPCT 4.3*   Sepsis Labs: No results for input(s): PROCALCITON, LATICACIDVEN in the last 168 hours.  Recent Results (from the past 240 hour(s))  MRSA PCR Screening     Status: None   Collection Time: 03/10/18 10:05 PM  Result Value Ref Range Status   MRSA by PCR NEGATIVE NEGATIVE Final    Comment:        The GeneXpert MRSA Assay (FDA approved for NASAL specimens only), is one component of a comprehensive MRSA colonization surveillance program. It is not intended to diagnose MRSA infection nor to guide or monitor treatment for MRSA infections. Performed at [facility], [place] [address] [place], [place] [address]          Radiology Studies: Ct Angio Chest Pe W/cm &/or Wo Cm  Result Date: 03/10/2018 CLINICAL DATA:  Acute DVT in the right lower extremity by report. Palpable right abdominal mass. EXAM: CT ANGIOGRAPHY CHEST CT ABDOMEN AND PELVIS WITH CONTRAST TECHNIQUE: Multidetector CT imaging of the chest was performed using the standard protocol during bolus administration of intravenous contrast. Multiplanar CT image reconstructions and MIPs were obtained to evaluate the vascular anatomy. Multidetector CT imaging of the abdomen and pelvis was performed using the standard protocol during bolus administration of intravenous contrast. CONTRAST:  134m  ISOVUE-370 IOPAMIDOL (ISOVUE-370) INJECTION 76% COMPARISON:  None. FINDINGS: CTA CHEST FINDINGS Cardiovascular: The study is moderate quality for the evaluation of pulmonary embolism, limited by motion degradation particularly in the lower lungs. There are lobar, segmental and subsegmental pulmonary emboli in the left upper, left lower and right upper lobes. There are segmental and subsegmental pulmonary emboli in the right lower lobe. No central or saddle pulmonary emboli. Dilated main pulmonary artery (3.5 cm diameter). Normal course and caliber of thoracic aorta. Top-normal heart size. Elevated RV/LV ratio 1.1. No significant pericardial fluid/thickening. Left anterior descending coronary atherosclerosis. Mediastinum/Nodes: No discrete thyroid nodules. Unremarkable esophagus. No pathologically enlarged axillary, mediastinal or hilar lymph nodes. Lungs/Pleura: No pneumothorax. No pleural effusion. Subpleural 2 mm basilar left lower lobe solid pulmonary nodule (series 4/image 122). No acute consolidative airspace disease, lung masses or additional significant pulmonary nodules. Musculoskeletal: No aggressive appearing focal osseous lesions. Moderate thoracic spondylosis. Review of the MIP images confirms the above  findings. CT ABDOMEN and PELVIS FINDINGS Hepatobiliary: Normal liver size. Solitary subcentimeter hypodense right liver dome lesion (series 4/image 11), too small to characterize. No additional liver lesions. Normal gallbladder with no radiopaque cholelithiasis. No biliary ductal dilatation. Pancreas: Normal, with no mass or duct dilation. Spleen: Normal size. No mass. Adrenals/Urinary Tract: Normal adrenals. Mild right hydronephrosis with ill-defined soft tissue encasing the right ureteropelvic junction. Nonobstructing 5 mm stone in the posterior lower right kidney. No additional renal stones. No left hydronephrosis. A few subcentimeter hypodense renal cortical lesions scattered in the kidneys  bilaterally, too small to characterize. Normal caliber left ureter. Masslike wall thickening in the distal pelvic segment of the right ureter extending to the right ureterovesical junction measuring up to 2.5 x 1.9 x 5.0 cm (series 4/image 72). Otherwise normal nondistended bladder. Stomach/Bowel: Normal non-distended stomach. There is a large irregular infiltrative solid mass in the right lower quadrant measuring up to 18.7 x 18.5 x 17.8 cm (series 4/image 57), which infiltrates and encases multiple distal small bowel loops and likely the ileocecal region. There is partial encasement of the sigmoid colon by the mass. There is prominent extension of the mass into the right lower retroperitoneum and extraperitoneal right pelvis with encasement of the right external iliac and proximal right common iliac vasculature and with infiltration of the right iliopsoas muscle. There is dilatation of the involved right sided small bowel loops. No proximal small bowel dilatation. The transverse and left colon are collapsed with no wall thickening in these locations. Moderate sigmoid diverticulosis. Vascular/Lymphatic: Atherosclerotic nonaneurysmal abdominal aorta. Patent portal, splenic, hepatic and renal veins. There is bulky soft tissue throughout right external and common iliac nodal chains. Reproductive: Top-normal size prostate with coarse nonspecific internal prostatic calcifications. Other: No pneumoperitoneum, ascites or focal fluid collection. Trace presacral fluid. Asymmetric fat stranding throughout subcutaneous right proximal lower extremity and right flank. Musculoskeletal: No aggressive appearing focal osseous lesions. Moderate lumbar spondylosis. Review of the MIP images confirms the above findings. IMPRESSION: CT CHEST: 1. Acute bilateral pulmonary embolism involving lobar, segmental and subsegmental branches. No saddle pulmonary emboli. Positive for acute PE with CT evidence of right heart strain (RV/LV Ratio =  1.1) consistent with at least submassive (intermediate risk) PE. The presence of right heart strain has been associated with an increased risk of morbidity and mortality. Please activate Code PE by paging [phone]. 2. Dilated main pulmonary artery, suggesting pulmonary arterial hypertension. 3. 1 vessel coronary atherosclerosis. 4. No findings suspicious for metastatic disease in the chest. Solitary 2 mm subpleural left lower lobe solid pulmonary nodule, for which follow-up chest CT is advised in 3 months. CT ABDOMEN and PELVIS: 1. Large irregular infiltrative solid mass in the right lower quadrant measuring up to 18.7 x 18.5 x 17.8 cm, infiltrating and encasing multiple distal small bowel loops and likely the ileocecal region, partially encasing the sigmoid colon, with prominent extension into the right lower retroperitoneum and extraperitoneal right pelvis with encasement of right external iliac and proximal right common iliac vasculature and infiltration of the right iliopsoas muscle. 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 Differential includes small bowel GIST or adenocarcinoma. 2. Mild right hydronephrosis due to thick soft tissue encasing the right ureter most prominently at the right UPJ and right UVJ. 3. Nonobstructing right nephrolithiasis. 4. Moderate sigmoid diverticulosis. 5.  Aortic Atherosclerosis (ICD10-I70.0). Critical Value/emergent results were called by telephone at the time of interpretation on 03/10/2018 at 5:38 pm to Dr. Kennith Maes , who verbally acknowledged these results. Electronically Signed  By: Ilona Sorrel M.D.   On: 03/10/2018 17:41   Ct Abdomen Pelvis W Contrast  Result Date: 03/10/2018 CLINICAL DATA:  Acute DVT in the right lower extremity by report. Palpable right abdominal mass. EXAM: CT ANGIOGRAPHY CHEST CT ABDOMEN AND PELVIS WITH CONTRAST TECHNIQUE: Multidetector CT imaging of the chest was performed using the standard protocol during bolus administration of intravenous  contrast. Multiplanar CT image reconstructions and MIPs were obtained to evaluate the vascular anatomy. Multidetector CT imaging of the abdomen and pelvis was performed using the standard protocol during bolus administration of intravenous contrast. CONTRAST:  12m ISOVUE-370 IOPAMIDOL (ISOVUE-370) INJECTION 76% COMPARISON:  None. FINDINGS: CTA CHEST FINDINGS Cardiovascular: The study is moderate quality for the evaluation of pulmonary embolism, limited by motion degradation particularly in the lower lungs. There are lobar, segmental and subsegmental pulmonary emboli in the left upper, left lower and right upper lobes. There are segmental and subsegmental pulmonary emboli in the right lower lobe. No central or saddle pulmonary emboli. Dilated main pulmonary artery (3.5 cm diameter). Normal course and caliber of thoracic aorta. Top-normal heart size. Elevated RV/LV ratio 1.1. No significant pericardial fluid/thickening. Left anterior descending coronary atherosclerosis. Mediastinum/Nodes: No discrete thyroid nodules. Unremarkable esophagus. No pathologically enlarged axillary, mediastinal or hilar lymph nodes. Lungs/Pleura: No pneumothorax. No pleural effusion. Subpleural 2 mm basilar left lower lobe solid pulmonary nodule (series 4/image 122). No acute consolidative airspace disease, lung masses or additional significant pulmonary nodules. Musculoskeletal: No aggressive appearing focal osseous lesions. Moderate thoracic spondylosis. Review of the MIP images confirms the above findings. CT ABDOMEN and PELVIS FINDINGS Hepatobiliary: Normal liver size. Solitary subcentimeter hypodense right liver dome lesion (series 4/image 11), too small to characterize. No additional liver lesions. Normal gallbladder with no radiopaque cholelithiasis. No biliary ductal dilatation. Pancreas: Normal, with no mass or duct dilation. Spleen: Normal size. No mass. Adrenals/Urinary Tract: Normal adrenals. Mild right hydronephrosis with  ill-defined soft tissue encasing the right ureteropelvic junction. Nonobstructing 5 mm stone in the posterior lower right kidney. No additional renal stones. No left hydronephrosis. A few subcentimeter hypodense renal cortical lesions scattered in the kidneys bilaterally, too small to characterize. Normal caliber left ureter. Masslike wall thickening in the distal pelvic segment of the right ureter extending to the right ureterovesical junction measuring up to 2.5 x 1.9 x 5.0 cm (series 4/image 72). Otherwise normal nondistended bladder. Stomach/Bowel: Normal non-distended stomach. There is a large irregular infiltrative solid mass in the right lower quadrant measuring up to 18.7 x 18.5 x 17.8 cm (series 4/image 57), which infiltrates and encases multiple distal small bowel loops and likely the ileocecal region. There is partial encasement of the sigmoid colon by the mass. There is prominent extension of the mass into the right lower retroperitoneum and extraperitoneal right pelvis with encasement of the right external iliac and proximal right common iliac vasculature and with infiltration of the right iliopsoas muscle. There is dilatation of the involved right sided small bowel loops. No proximal small bowel dilatation. The transverse and left colon are collapsed with no wall thickening in these locations. Moderate sigmoid diverticulosis. Vascular/Lymphatic: Atherosclerotic nonaneurysmal abdominal aorta. Patent portal, splenic, hepatic and renal veins. There is bulky soft tissue throughout right external and common iliac nodal chains. Reproductive: Top-normal size prostate with coarse nonspecific internal prostatic calcifications. Other: No pneumoperitoneum, ascites or focal fluid collection. Trace presacral fluid. Asymmetric fat stranding throughout subcutaneous right proximal lower extremity and right flank. Musculoskeletal: No aggressive appearing focal osseous lesions. Moderate lumbar spondylosis. Review of  the  MIP images confirms the above findings. IMPRESSION: CT CHEST: 1. Acute bilateral pulmonary embolism involving lobar, segmental and subsegmental branches. No saddle pulmonary emboli. Positive for acute PE with CT evidence of right heart strain (RV/LV Ratio = 1.1) consistent with at least submassive (intermediate risk) PE. The presence of right heart strain has been associated with an increased risk of morbidity and mortality. Please activate Code PE by paging [phone]. 2. Dilated main pulmonary artery, suggesting pulmonary arterial hypertension. 3. 1 vessel coronary atherosclerosis. 4. No findings suspicious for metastatic disease in the chest. Solitary 2 mm subpleural left lower lobe solid pulmonary nodule, for which follow-up chest CT is advised in 3 months. CT ABDOMEN and PELVIS: 1. Large irregular infiltrative solid mass in the right lower quadrant measuring up to 18.7 x 18.5 x 17.8 cm, infiltrating and encasing multiple distal small bowel loops and likely the ileocecal region, partially encasing the sigmoid colon, with prominent extension into the right lower retroperitoneum and extraperitoneal right pelvis with encasement of right external iliac and proximal right common iliac vasculature and infiltration of the right iliopsoas muscle. 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 Differential includes small bowel GIST or adenocarcinoma. 2. Mild right hydronephrosis due to thick soft tissue encasing the right ureter most prominently at the right UPJ and right UVJ. 3. Nonobstructing right nephrolithiasis. 4. Moderate sigmoid diverticulosis. 5.  Aortic Atherosclerosis (ICD10-I70.0). Critical Value/emergent results were called by telephone at the time of interpretation on 03/10/2018 at 5:38 pm to Dr. Kennith Maes , who verbally acknowledged these results. Electronically Signed   By: Ilona Sorrel M.D.   On: 03/10/2018 17:41        Scheduled Meds: . docusate sodium  100 mg Oral BID  . fenofibrate  160 mg Oral  Daily  . insulin aspart  0-9 Units Subcutaneous Q4H   Continuous Infusions: . heparin 2,100 Units/hr (03/11/18 1144)     LOS: 1 day     Cordelia Poche, MD [facility] 03/11/2018, 12:34 PM Pager: [phone]  If 7PM-7AM, please contact night-coverage [email or web address] 03/11/2018, 12:34 PM

## 2018-03-11 NOTE — Plan of Care (Signed)
Patient received from ICU as a transfer to [facility], no complaints of pain anywhere, denies shortness of breath or cough.  Patient sitting up in chair, heparin drip infusing.  Wife at bedside.

## 2018-03-11 NOTE — Progress Notes (Addendum)
[facility] for heparin Indication: pulmonary embolus  Allergies  Allergen Reactions  . Ciprofloxacin Other (See Comments)    Leg tingling    Patient Measurements: Height: 5' 9"  (175.3 cm) Weight: 260 lb (117.9 kg) IBW/kg (Calculated) : 70.7 Heparin Dosing Weight: 97.2 kg  Vital Signs: Temp: 98.2 F (36.8 C) (06/29 0800) Temp Source: Oral (06/29 0800) BP: 143/76 (06/29 0700)  Labs: Recent Labs    03/10/18 1530  03/10/18 1847 03/10/18 1914 03/11/18 0152 03/11/18 0733 03/11/18 0734 03/11/18 1013  HGB 8.5*  --   --   --  7.5* 8.2*  --   --   HCT 27.6*  --   --   --  24.7* 26.5*  --   --   PLT 488*  --   --   --  442* 476*  --   --   APTT  --    < > 32  --  37*  --  46* 38*  LABPROT  --   --  18.0*  --   --   --   --   --   INR  --   --  1.50  --   --   --   --   --   HEPARINUNFRC  --   --  1.88*  --  1.06*  --   --   --   CREATININE 1.11  --   --   --  1.03  --   --   --   TROPONINI  --   --   --  <0.03 <0.03 <0.03  --   --    < > = values in this interval not displayed.    Estimated Creatinine Clearance: 95.4 mL/min (by C-G formula based on SCr of 1.03 mg/dL).   Assessment: 59 yoM presents to ED on 6/28 with suspected DVT.  He was seen by his PCP on 6/27 with suspected DVT/PE and was started on Xarelto pending ultrasound results. 6/28 CT:  Acute bilateral PE with CT evidence of right heart strain  6/28 Venous duplex:  Right lower extremity is positive for acute DVT.  Pharmacy is consulted for heparin IV.  No thrombolytics planned at this time. Discussed hemoccult positive and anemia results with Dr. Tamera Punt, [place] to proceed with full dose heparin.  PTA Xarelto 15 mg PO BID with last dose taken on 6/28 at 0730. Patient started on 6/27 per PCP while VTE being ruled out.   Baseline coags: INR 1.5, APTT 32, HL = 1.88 FOB +, no active bleeding noted.  Pt reports increase Ibuprofen use recently.  03/11/2018  APTT remains  SUBtherapeutic despite rate increase to 1900 units/hr earlier this am  0730am level = 46 sec - drawn earlier than anticipated (not at new steady state following rate increase early am).  Steady state heparin level at 10am lower at 38sec.  RN notes that while eating breakfast IV pump reading occlusion as IV site in the [facility] and causing pump to beep requiring multiple restarts.    CBC: Hgb decreased but stable, pltc elevated  Patient with large intra-abdominal mass.  Seen by oncology this am.  Plans for biopsy (radiology vs surgery - to be determined)  No obvious bleeding complications  Goal of Therapy:  APTT 66-102 seceond , aim for 66 - 85 seconds Heparin level 0.3-0.7 units/ml Monitor platelets by anticoagulation protocol: Yes   Plan:   3000 unit bolus and increase drip to 2100 units/hr and  check 6 hr aPTT  Use APTT for heparin dose adjustment.  Heparin level falsely elevated d/t recent Xarelto use.  Daily aPTT, heparin level and CBC  F/u plans for biopsy and need to hold heparin gtt  Doreene Eland, PharmD, BCPS.   Pager: [phone] 03/11/2018 11:20 AM

## 2018-03-12 DIAGNOSIS — I82409 Acute embolism and thrombosis of unspecified deep veins of unspecified lower extremity: Secondary | ICD-10-CM

## 2018-03-12 LAB — GLUCOSE, CAPILLARY
GLUCOSE-CAPILLARY: 86 mg/dL (ref 70–99)
Glucose-Capillary: 83 mg/dL (ref 70–99)
Glucose-Capillary: 87 mg/dL (ref 70–99)
Glucose-Capillary: 106 mg/dL — ABNORMAL HIGH (ref 70–99)
Glucose-Capillary: 78 mg/dL (ref 70–99)
Glucose-Capillary: 100 mg/dL — ABNORMAL HIGH (ref 70–99)

## 2018-03-12 LAB — CBC
HCT: 26 % — ABNORMAL LOW (ref 39.0–52.0)
HCT: 28.5 % — ABNORMAL LOW (ref 39.0–52.0)
HEMOGLOBIN: 8.1 g/dL — AB (ref 13.0–17.0)
Hemoglobin: 8.8 g/dL — ABNORMAL LOW (ref 13.0–17.0)
MCH: 26.9 pg (ref 26.0–34.0)
MCH: 26.7 pg (ref 26.0–34.0)
MCHC: 31.2 g/dL (ref 30.0–36.0)
MCHC: 30.9 g/dL (ref 30.0–36.0)
MCV: 86.4 fL (ref 78.0–100.0)
MCV: 86.4 fL (ref 78.0–100.0)
PLATELETS: 511 10*3/uL — AB (ref 150–400)
Platelets: 498 10*3/uL — ABNORMAL HIGH (ref 150–400)
RBC: 3.3 MIL/uL — AB (ref 4.22–5.81)
RBC: 3.01 MIL/uL — AB (ref 4.22–5.81)
RDW: 17.3 % — ABNORMAL HIGH (ref 11.5–15.5)
RDW: 17.3 % — AB (ref 11.5–15.5)
WBC: 9 10*3/uL (ref 4.0–10.5)
WBC: 7.9 10*3/uL (ref 4.0–10.5)

## 2018-03-12 LAB — APTT
APTT: 66 s — AB (ref 24–36)
aPTT: 60 seconds — ABNORMAL HIGH (ref 24–36)
aPTT: 73 seconds — ABNORMAL HIGH (ref 24–36)

## 2018-03-12 LAB — HEPARIN LEVEL (UNFRACTIONATED)
HEPARIN UNFRACTIONATED: 0.47 [IU]/mL (ref 0.30–0.70)
HEPARIN UNFRACTIONATED: 0.44 [IU]/mL (ref 0.30–0.70)

## 2018-03-12 MED ORDER — HEPARIN (PORCINE) IN NACL 100-0.45 UNIT/ML-% IJ SOLN
2500.0000 [IU]/h | INTRAMUSCULAR | Status: DC
  Administered 2018-03-12 (×2): 2500 [IU]/h via INTRAVENOUS
  Filled 2018-03-12 (×2): qty 250

## 2018-03-12 MED ORDER — HEPARIN (PORCINE) IN NACL 100-0.45 UNIT/ML-% IJ SOLN
2550.0000 [IU]/h | INTRAMUSCULAR | Status: DC
  Filled 2018-03-12: qty 250

## 2018-03-12 NOTE — Consult Note (Signed)
Chief Complaint: Patient was seen in consultation today for abdominal mass  Referring Physician(s): Dr. Lonny Prude  Supervising Physician: Arne Cleveland  Patient Status: [facility] - In-pt  History of Present Illness: Mark Frey is a 62 y.o. male with past medical history of DM, HLD who presented to [facility] ED with swollen right leg.  Patient found to have right leg DVT and bilateral PEs.  He was treated with heparin drip with improvement.  Further evaluation with CT imaging showed a large abdominal mass.  IR consults for abdominal mass biopsy at the request of Dr. Lonny Prude.    Past Medical History:  Diagnosis Date  . ALLERGIC RHINITIS   . Diabetes mellitus   . Hyperlipidemia     Past Surgical History:  Procedure Laterality Date  . CLEFT PALATE REPAIR    . deviated septum repair     slight improvement  . TONSILLECTOMY      Allergies: Ciprofloxacin  Medications: Prior to Admission medications   Medication Sig Start Date End Date Taking? Authorizing Provider  aspirin 81 MG tablet Take 81 mg by mouth daily.   Yes [provider]  Choline Fenofibrate 135 MG capsule TAKE 1 CAPSULE DAILY 11/29/17  Yes [name], MD  glimepiride (AMARYL) 2 MG tablet Take 1 tablet (2 mg total) by mouth daily before breakfast. Patient taking differently: Take 1 mg by mouth daily before breakfast.  12/28/17  Yes [name], MD  ibuprofen (ADVIL,MOTRIN) 200 MG tablet Take 200 mg by mouth every 6 (six) hours as needed for moderate pain.   Yes [provider]  JANUMET 50-1000 MG tablet TAKE 1 TABLET TWICE DAILY  WITH MEALS 11/29/17  Yes [name], MD  Multiple Vitamin (MULTIVITAMIN) capsule Take 1 capsule by mouth daily.     Yes [provider]  RIVAROXABAN PO Take 1 mg by mouth daily with supper.   Yes [provider]  glucose blood (FREESTYLE TEST STRIPS) test strip 1 each by Other route daily. Use as instructed     [provider]    Lancets (FREESTYLE) lancets 1 each by Other route as directed. Use as instructed     [provider]     Family History  Problem Relation Age of Onset  . Lung cancer Mother        smoker  . Brain cancer Mother        metastasis  . AAA (abdominal aortic aneurysm) Father        smoker    Social History   Socioeconomic History  . Marital status: Married    Spouse name: Not on file  . Number of children: Not on file  . Years of education: Not on file  . Highest education level: Not on file  Occupational History  . Not on file  Social Needs  . Financial resource strain: Not on file  . Food insecurity:    Worry: Not on file    Inability: Not on file  . Transportation needs:    Medical: Not on file    Non-medical: Not on file  Tobacco Use  . Smoking status: Never Smoker  . Smokeless tobacco: Never Used  Substance and Sexual Activity  . Alcohol use: Yes    Comment: occasional  . Drug use: No  . Sexual activity: Yes  Lifestyle  . Physical activity:    Days per week: Not on file    Minutes per session: Not on file  . Stress: Not  on file  Relationships  . Social connections:    Talks on phone: Not on file    Gets together: Not on file    Attends religious service: Not on file    Active member of club or organization: Not on file    Attends meetings of clubs or organizations: Not on file    Relationship status: Not on file  Other Topics Concern  . Not on file  Social History Narrative   Married 1985. No kids. 4 small dogs.       Works in Financial trader, residential      Hobbies: work on cars, Haematologist, exercise as able     Review of Systems: A 12 point ROS discussed and pertinent positives are indicated in the HPI above.  All other systems are negative.  Review of Systems  Constitutional: Negative for fatigue and fever.  Respiratory: Positive for shortness of breath (minimal, PTA). Negative for cough.   Cardiovascular: Negative for chest  pain.  Gastrointestinal: Negative for abdominal distention, abdominal pain and constipation.  Psychiatric/Behavioral: Negative for behavioral problems and confusion.    Vital Signs: BP (!) 149/78 (BP Location: Right Arm)   Pulse (!) 111   Temp 97.9 F (36.6 C) (Oral)   Resp 18   Ht 5' 9"  (1.753 m)   Wt 260 lb (117.9 kg)   SpO2 98%   BMI 38.40 kg/m   Physical Exam  Constitutional: He is oriented to person, place, and time. He appears well-developed.  Cardiovascular: Normal rate, regular rhythm and normal heart sounds.  Pulmonary/Chest: Effort normal and breath sounds normal. No respiratory distress.  Abdominal: Soft. He exhibits no distension. There is no tenderness.  Neurological: He is alert and oriented to person, place, and time.  Skin: Skin is warm and dry.  Psychiatric: He has a normal mood and affect. His behavior is normal. Judgment and thought content normal.  Nursing note and vitals reviewed.    MD Evaluation Airway: WNL Heart: WNL Abdomen: WNL Chest/ Lungs: WNL ASA  Classification: 2 Mallampati/Airway Score: One   Imaging: Ct Angio Chest Pe W/cm &/or Wo Cm  Result Date: 03/10/2018 CLINICAL DATA:  Acute DVT in the right lower extremity by report. Palpable right abdominal mass. EXAM: CT ANGIOGRAPHY CHEST CT ABDOMEN AND PELVIS WITH CONTRAST TECHNIQUE: Multidetector CT imaging of the chest was performed using the standard protocol during bolus administration of intravenous contrast. Multiplanar CT image reconstructions and MIPs were obtained to evaluate the vascular anatomy. Multidetector CT imaging of the abdomen and pelvis was performed using the standard protocol during bolus administration of intravenous contrast. CONTRAST:  139m ISOVUE-370 IOPAMIDOL (ISOVUE-370) INJECTION 76% COMPARISON:  None. FINDINGS: CTA CHEST FINDINGS Cardiovascular: The study is moderate quality for the evaluation of pulmonary embolism, limited by motion degradation particularly in the lower  lungs. There are lobar, segmental and subsegmental pulmonary emboli in the left upper, left lower and right upper lobes. There are segmental and subsegmental pulmonary emboli in the right lower lobe. No central or saddle pulmonary emboli. Dilated main pulmonary artery (3.5 cm diameter). Normal course and caliber of thoracic aorta. Top-normal heart size. Elevated RV/LV ratio 1.1. No significant pericardial fluid/thickening. Left anterior descending coronary atherosclerosis. Mediastinum/Nodes: No discrete thyroid nodules. Unremarkable esophagus. No pathologically enlarged axillary, mediastinal or hilar lymph nodes. Lungs/Pleura: No pneumothorax. No pleural effusion. Subpleural 2 mm basilar left lower lobe solid pulmonary nodule (series 4/image 122). No acute consolidative airspace disease, lung masses or additional significant pulmonary nodules. Musculoskeletal: No aggressive appearing  focal osseous lesions. Moderate thoracic spondylosis. Review of the MIP images confirms the above findings. CT ABDOMEN and PELVIS FINDINGS Hepatobiliary: Normal liver size. Solitary subcentimeter hypodense right liver dome lesion (series 4/image 11), too small to characterize. No additional liver lesions. Normal gallbladder with no radiopaque cholelithiasis. No biliary ductal dilatation. Pancreas: Normal, with no mass or duct dilation. Spleen: Normal size. No mass. Adrenals/Urinary Tract: Normal adrenals. Mild right hydronephrosis with ill-defined soft tissue encasing the right ureteropelvic junction. Nonobstructing 5 mm stone in the posterior lower right kidney. No additional renal stones. No left hydronephrosis. A few subcentimeter hypodense renal cortical lesions scattered in the kidneys bilaterally, too small to characterize. Normal caliber left ureter. Masslike wall thickening in the distal pelvic segment of the right ureter extending to the right ureterovesical junction measuring up to 2.5 x 1.9 x 5.0 cm (series 4/image 72).  Otherwise normal nondistended bladder. Stomach/Bowel: Normal non-distended stomach. There is a large irregular infiltrative solid mass in the right lower quadrant measuring up to 18.7 x 18.5 x 17.8 cm (series 4/image 57), which infiltrates and encases multiple distal small bowel loops and likely the ileocecal region. There is partial encasement of the sigmoid colon by the mass. There is prominent extension of the mass into the right lower retroperitoneum and extraperitoneal right pelvis with encasement of the right external iliac and proximal right common iliac vasculature and with infiltration of the right iliopsoas muscle. There is dilatation of the involved right sided small bowel loops. No proximal small bowel dilatation. The transverse and left colon are collapsed with no wall thickening in these locations. Moderate sigmoid diverticulosis. Vascular/Lymphatic: Atherosclerotic nonaneurysmal abdominal aorta. Patent portal, splenic, hepatic and renal veins. There is bulky soft tissue throughout right external and common iliac nodal chains. Reproductive: Top-normal size prostate with coarse nonspecific internal prostatic calcifications. Other: No pneumoperitoneum, ascites or focal fluid collection. Trace presacral fluid. Asymmetric fat stranding throughout subcutaneous right proximal lower extremity and right flank. Musculoskeletal: No aggressive appearing focal osseous lesions. Moderate lumbar spondylosis. Review of the MIP images confirms the above findings. IMPRESSION: CT CHEST: 1. Acute bilateral pulmonary embolism involving lobar, segmental and subsegmental branches. No saddle pulmonary emboli. Positive for acute PE with CT evidence of right heart strain (RV/LV Ratio = 1.1) consistent with at least submassive (intermediate risk) PE. The presence of right heart strain has been associated with an increased risk of morbidity and mortality. Please activate Code PE by paging [phone]. 2. Dilated main pulmonary  artery, suggesting pulmonary arterial hypertension. 3. 1 vessel coronary atherosclerosis. 4. No findings suspicious for metastatic disease in the chest. Solitary 2 mm subpleural left lower lobe solid pulmonary nodule, for which follow-up chest CT is advised in 3 months. CT ABDOMEN and PELVIS: 1. Large irregular infiltrative solid mass in the right lower quadrant measuring up to 18.7 x 18.5 x 17.8 cm, infiltrating and encasing multiple distal small bowel loops and likely the ileocecal region, partially encasing the sigmoid colon, with prominent extension into the right lower retroperitoneum and extraperitoneal right pelvis with encasement of right external iliac and proximal right common iliac vasculature and infiltration of the right iliopsoas muscle. 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 Differential includes small bowel GIST or adenocarcinoma. 2. Mild right hydronephrosis due to thick soft tissue encasing the right ureter most prominently at the right UPJ and right UVJ. 3. Nonobstructing right nephrolithiasis. 4. Moderate sigmoid diverticulosis. 5.  Aortic Atherosclerosis (ICD10-I70.0). Critical Value/emergent results were called by telephone at the time of interpretation on 03/10/2018 at 5:38  pm to Dr. Kennith Maes , who verbally acknowledged these results. Electronically Signed   By: Ilona Sorrel M.D.   On: 03/10/2018 17:41   Ct Abdomen Pelvis W Contrast  Result Date: 03/10/2018 CLINICAL DATA:  Acute DVT in the right lower extremity by report. Palpable right abdominal mass. EXAM: CT ANGIOGRAPHY CHEST CT ABDOMEN AND PELVIS WITH CONTRAST TECHNIQUE: Multidetector CT imaging of the chest was performed using the standard protocol during bolus administration of intravenous contrast. Multiplanar CT image reconstructions and MIPs were obtained to evaluate the vascular anatomy. Multidetector CT imaging of the abdomen and pelvis was performed using the standard protocol during bolus administration of intravenous  contrast. CONTRAST:  163m ISOVUE-370 IOPAMIDOL (ISOVUE-370) INJECTION 76% COMPARISON:  None. FINDINGS: CTA CHEST FINDINGS Cardiovascular: The study is moderate quality for the evaluation of pulmonary embolism, limited by motion degradation particularly in the lower lungs. There are lobar, segmental and subsegmental pulmonary emboli in the left upper, left lower and right upper lobes. There are segmental and subsegmental pulmonary emboli in the right lower lobe. No central or saddle pulmonary emboli. Dilated main pulmonary artery (3.5 cm diameter). Normal course and caliber of thoracic aorta. Top-normal heart size. Elevated RV/LV ratio 1.1. No significant pericardial fluid/thickening. Left anterior descending coronary atherosclerosis. Mediastinum/Nodes: No discrete thyroid nodules. Unremarkable esophagus. No pathologically enlarged axillary, mediastinal or hilar lymph nodes. Lungs/Pleura: No pneumothorax. No pleural effusion. Subpleural 2 mm basilar left lower lobe solid pulmonary nodule (series 4/image 122). No acute consolidative airspace disease, lung masses or additional significant pulmonary nodules. Musculoskeletal: No aggressive appearing focal osseous lesions. Moderate thoracic spondylosis. Review of the MIP images confirms the above findings. CT ABDOMEN and PELVIS FINDINGS Hepatobiliary: Normal liver size. Solitary subcentimeter hypodense right liver dome lesion (series 4/image 11), too small to characterize. No additional liver lesions. Normal gallbladder with no radiopaque cholelithiasis. No biliary ductal dilatation. Pancreas: Normal, with no mass or duct dilation. Spleen: Normal size. No mass. Adrenals/Urinary Tract: Normal adrenals. Mild right hydronephrosis with ill-defined soft tissue encasing the right ureteropelvic junction. Nonobstructing 5 mm stone in the posterior lower right kidney. No additional renal stones. No left hydronephrosis. A few subcentimeter hypodense renal cortical lesions  scattered in the kidneys bilaterally, too small to characterize. Normal caliber left ureter. Masslike wall thickening in the distal pelvic segment of the right ureter extending to the right ureterovesical junction measuring up to 2.5 x 1.9 x 5.0 cm (series 4/image 72). Otherwise normal nondistended bladder. Stomach/Bowel: Normal non-distended stomach. There is a large irregular infiltrative solid mass in the right lower quadrant measuring up to 18.7 x 18.5 x 17.8 cm (series 4/image 57), which infiltrates and encases multiple distal small bowel loops and likely the ileocecal region. There is partial encasement of the sigmoid colon by the mass. There is prominent extension of the mass into the right lower retroperitoneum and extraperitoneal right pelvis with encasement of the right external iliac and proximal right common iliac vasculature and with infiltration of the right iliopsoas muscle. There is dilatation of the involved right sided small bowel loops. No proximal small bowel dilatation. The transverse and left colon are collapsed with no wall thickening in these locations. Moderate sigmoid diverticulosis. Vascular/Lymphatic: Atherosclerotic nonaneurysmal abdominal aorta. Patent portal, splenic, hepatic and renal veins. There is bulky soft tissue throughout right external and common iliac nodal chains. Reproductive: Top-normal size prostate with coarse nonspecific internal prostatic calcifications. Other: No pneumoperitoneum, ascites or focal fluid collection. Trace presacral fluid. Asymmetric fat stranding throughout subcutaneous right proximal lower extremity  and right flank. Musculoskeletal: No aggressive appearing focal osseous lesions. Moderate lumbar spondylosis. Review of the MIP images confirms the above findings. IMPRESSION: CT CHEST: 1. Acute bilateral pulmonary embolism involving lobar, segmental and subsegmental branches. No saddle pulmonary emboli. Positive for acute PE with CT evidence of right  heart strain (RV/LV Ratio = 1.1) consistent with at least submassive (intermediate risk) PE. The presence of right heart strain has been associated with an increased risk of morbidity and mortality. Please activate Code PE by paging [phone]. 2. Dilated main pulmonary artery, suggesting pulmonary arterial hypertension. 3. 1 vessel coronary atherosclerosis. 4. No findings suspicious for metastatic disease in the chest. Solitary 2 mm subpleural left lower lobe solid pulmonary nodule, for which follow-up chest CT is advised in 3 months. CT ABDOMEN and PELVIS: 1. Large irregular infiltrative solid mass in the right lower quadrant measuring up to 18.7 x 18.5 x 17.8 cm, infiltrating and encasing multiple distal small bowel loops and likely the ileocecal region, partially encasing the sigmoid colon, with prominent extension into the right lower retroperitoneum and extraperitoneal right pelvis with encasement of right external iliac and proximal right common iliac vasculature and infiltration of the right iliopsoas muscle. 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 Differential includes small bowel GIST or adenocarcinoma. 2. Mild right hydronephrosis due to thick soft tissue encasing the right ureter most prominently at the right UPJ and right UVJ. 3. Nonobstructing right nephrolithiasis. 4. Moderate sigmoid diverticulosis. 5.  Aortic Atherosclerosis (ICD10-I70.0). Critical Value/emergent results were called by telephone at the time of interpretation on 03/10/2018 at 5:38 pm to Dr. Kennith Maes , who verbally acknowledged these results. Electronically Signed   By: Ilona Sorrel M.D.   On: 03/10/2018 17:41    Labs:  CBC: Recent Labs    03/11/18 0733 03/11/18 1803 03/12/18 0037 03/12/18 0853  WBC 7.6 8.1 7.9 9.0  HGB 8.2* 8.4* 8.1* 8.8*  HCT 26.5* 27.4* 26.0* 28.5*  PLT 476* 529* 498* 511*    COAGS: Recent Labs    03/10/18 1847  03/11/18 1803 03/12/18 0037 03/12/18 0853 03/12/18 1445  INR 1.50  --    --   --   --   --   APTT 32   < > 41* 60* 73* 66*   < > = values in this interval not displayed.    BMP: Recent Labs    08/29/17 0941 03/10/18 1530 03/11/18 0152  NA 138 142 141  K 4.5 3.6 3.6  CL 102 108 109  CO2 27 23 22   GLUCOSE 171* 53* 74  BUN 18 17 15   CALCIUM 9.6 9.2 8.9  CREATININE 1.08 1.11 1.03  GFRNONAA  --  >60 >60  GFRAA  --  >60 >60    LIVER FUNCTION TESTS: Recent Labs    08/29/17 0941 03/10/18 1530 03/11/18 0152  BILITOT 0.5 0.5 0.3  AST 21 21 19   ALT 29 14 15   ALKPHOS 32* 32* 29*  PROT 7.4 7.3 6.5  ALBUMIN 4.6 3.3* 2.8*    TUMOR MARKERS: No results for input(s): AFPTM, CEA, CA199, CHROMGRNA in the last 8760 hours.  Assessment and Plan: Abdominal mass Patient with PMH of DM, HLD presents with DVT and bilateral PEs. Also found to have large abdominal mass.   CT Abdomen/Pelvis 03/10/18: 1. Large irregular infiltrative solid mass in the right lower quadrant measuring up to 18.7 x 18.5 x 17.8 cm, infiltrating and encasing multiple distal small bowel loops and likely the ileocecal region, partially encasing the sigmoid colon, with  prominent extension into the right lower retroperitoneum and extraperitoneal right pelvis with encasement of right external iliac and proximal right common iliac vasculature and infiltration of the right iliopsoas muscle. 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 Differential includes small bowel GIST or adenocarcinoma. 2. Mild right hydronephrosis due to thick soft tissue encasing the right ureter most prominently at the right UPJ and right UVJ. 3. Nonobstructing right nephrolithiasis. 4. Moderate sigmoid diverticulosis. 5.  Aortic Atherosclerosis (ICD10-I70.0).  IR consulted for abdominal mass biopsy at the request of Dr. Lonny Prude.  Case reviewed by Dr. Vernard Gambles who approves case.  Patient to be NPO after midnight. Family understands biopsy as schedule allows.  Hep drip will need to be held tomorrow.  Risks and benefits discussed with  the patient including, but not limited to bleeding, infection, damage to adjacent structures or low yield requiring additional tests.  All of the patient's questions were answered, patient is agreeable to proceed. Consent signed and in chart.   Thank you for this interesting consult.  I greatly enjoyed meeting Mark Frey and look forward to participating in their care.  A copy of this report was sent to the requesting provider on this date.  Electronically Signed: Docia Barrier, PA 03/12/2018, 3:54 PM   I spent a total of 40 Minutes    in face to face in clinical consultation, greater than 50% of which was counseling/coordinating care for abdominal mass.

## 2018-03-12 NOTE — Progress Notes (Signed)
[place] for heparin Indication: pulmonary embolus  Allergies  Allergen Reactions  . Ciprofloxacin Other (See Comments)    Leg tingling    Patient Measurements: Height: 5' 9"  (175.3 cm) Weight: 260 lb (117.9 kg) IBW/kg (Calculated) : 70.7 Heparin Dosing Weight: 97.2 kg  Vital Signs: Temp: 98.6 F (37 C) (06/29 2233) Temp Source: Oral (06/29 2233) BP: 114/76 (06/29 2233) Pulse Rate: 71 (06/29 2233)  Labs: Recent Labs    03/10/18 1530  03/10/18 1847 03/10/18 1914 03/11/18 0152 03/11/18 0733  03/11/18 1013 03/11/18 1803 03/12/18 0037  HGB 8.5*  --   --   --  7.5* 8.2*  --   --  8.4* 8.1*  HCT 27.6*  --   --   --  24.7* 26.5*  --   --  27.4* 26.0*  PLT 488*  --   --   --  442* 476*  --   --  529* 498*  APTT  --    < > 32  --  37*  --    < > 38* 41* 60*  LABPROT  --   --  18.0*  --   --   --   --   --   --   --   INR  --   --  1.50  --   --   --   --   --   --   --   HEPARINUNFRC  --   --  1.88*  --  1.06*  --   --   --   --  0.44  CREATININE 1.11  --   --   --  1.03  --   --   --   --   --   TROPONINI  --   --   --  <0.03 <0.03 <0.03  --   --   --   --    < > = values in this interval not displayed.    Estimated Creatinine Clearance: 95.4 mL/min (by C-G formula based on SCr of 1.03 mg/dL).   Assessment: 46 yoM presents to ED on 6/28 with suspected DVT.  He was seen by his PCP on 6/27 with suspected DVT/PE and was started on Xarelto pending ultrasound results. 6/28 CT:  Acute bilateral PE with CT evidence of right heart strain  6/28 Venous duplex:  Right lower extremity is positive for acute DVT.  Pharmacy is consulted for heparin IV.  No thrombolytics planned at this time. Discussed hemoccult positive and anemia results with Dr. Tamera Punt, [place] to proceed with full dose heparin.  PTA Xarelto 15 mg PO BID with last dose taken on 6/28 at 0730. Patient started on 6/27 per PCP while VTE being ruled out.   Baseline coags: INR 1.5, APTT  32, HL = 1.88 FOB +, no active bleeding noted.  Pt reports increase Ibuprofen use recently.  03/12/2018  aPTT 60 sec and heparin level 0.44 after 3000 unit bolus and heparin rate increase to 2400 units/hr- aPTT is still below goal of 66-85 seconds  CBC: Hgb stable, pltc elevated  Patient with large intra-abdominal mass.  Seen by oncology this am.  Plans for biopsy (radiology vs surgery - to be determined)  No obvious bleeding complications  Goal of Therapy:  APTT 66-102 seceond , aim for 66 - 85 seconds Heparin level 0.3-0.7 units/ml Monitor platelets by anticoagulation protocol: Yes   Plan:   increase drip to 2500 units/hr and check 6 hr  aPTT  Use APTT for heparin dose adjustment.  Heparin level falsely elevated d/t recent Xarelto use.  Daily aPTT, heparin level and CBC  F/u plans for biopsy and need to hold heparin gtt  Eudelia Bunch, Pharm.D. [phone] 03/12/2018 2:11 AM

## 2018-03-12 NOTE — Progress Notes (Signed)
HEMATOLOGY-ONCOLOGY PROGRESS NOTE  SUBJECTIVE: Patient has been moved to stepdown unit.  He reports no new symptoms or concerns.  He actually had a bowel movement this morning.  OBJECTIVE: REVIEW OF SYSTEMS:   Constitutional: Denies fevers, chills or abnormal weight loss Eyes: Denies blurriness of vision Ears, nose, mouth, throat, and face: Denies mucositis or sore throat Respiratory: Denies cough, dyspnea or wheezes Cardiovascular: Denies palpitation, chest discomfort Gastrointestinal:  Denies nausea, heartburn or change in bowel habits Skin: Denies abnormal skin rashes Lymphatics: Denies new lymphadenopathy or easy bruising Neurological:Denies numbness, tingling or new weaknesses Behavioral/Psych: Mood is stable, no new changes  Extremities: No lower extremity edema  All other systems were reviewed with the patient and are negative.  I have reviewed the past medical history, past surgical history, social history and family history with the patient and they are unchanged from previous note.   PHYSICAL EXAMINATION: ECOG PERFORMANCE STATUS: 1 - Symptomatic but completely ambulatory  Vitals:   03/11/18 2233 03/12/18 0430  BP: 114/76 (!) 148/82  Pulse: 71 100  Resp: 19 18  Temp: 98.6 F (37 C) 98 F (36.7 C)  SpO2:  97%   Filed Weights   03/10/18 1215  Weight: 260 lb (117.9 kg)    GENERAL:alert, no distress and comfortable SKIN: skin color, texture, turgor are normal, no rashes or significant lesions EYES: normal, Conjunctiva are pink and non-injected, sclera clear OROPHARYNX:no exudate, no erythema and lips, buccal mucosa, and tongue normal  NECK: supple, thyroid normal size, non-tender, without nodularity LYMPH:  no palpable lymphadenopathy in the cervical, axillary or inguinal LUNGS: clear to auscultation and percussion with normal breathing effort HEART: regular rate & rhythm and no murmurs and no lower extremity edema ABDOMEN: Large masses  palpable Musculoskeletal:no cyanosis of digits and no clubbing  NEURO: alert & oriented x 3 with fluent speech, no focal motor/sensory deficits  LABORATORY DATA:  I have reviewed the data as listed CMP Latest Ref Rng & Units 03/11/2018 03/10/2018 08/29/2017  Glucose 70 - 99 mg/dL 74 53(L) 171(H)  BUN 8 - 23 mg/dL 15 17 18   Creatinine 0.61 - 1.24 mg/dL 1.03 1.11 1.08  Sodium 135 - 145 mmol/L 141 142 138  Potassium 3.5 - 5.1 mmol/L 3.6 3.6 4.5  Chloride 98 - 111 mmol/L 109 108 102  CO2 22 - 32 mmol/L 22 23 27   Calcium 8.9 - 10.3 mg/dL 8.9 9.2 9.6  Total Protein 6.5 - 8.1 g/dL 6.5 7.3 7.4  Total Bilirubin 0.3 - 1.2 mg/dL 0.3 0.5 0.5  Alkaline Phos 38 - 126 U/L 29(L) 32(L) 32(L)  AST 15 - 41 U/L 19 21 21   ALT 0 - 44 U/L 15 14 29     Lab Results  Component Value Date   WBC 9.0 03/12/2018   HGB 8.8 (L) 03/12/2018   HCT 28.5 (L) 03/12/2018   MCV 86.4 03/12/2018   PLT 511 (H) 03/12/2018   NEUTROABS 2.8 10/21/2015    ASSESSMENT AND PLAN: 1.  Large abdominal mass: Differential diagnosis is between gist versus lymphoma versus adenocarcinoma.  He needs a biopsy.  Hospitalist arranging for interventional radiology to obtain a biopsy.  Once we have more definite diagnosis, I will refer him to the correct specialist In our groupfor treatment. 2. bilateral PE and DVT: Continue with heparin until he gets a biopsy and after that he can be switched to Xarelto. 3.  Normocytic anemia being monitored After the biopsy could be discharged for follow-up in our outpatient clinic.

## 2018-03-12 NOTE — Progress Notes (Addendum)
ANTICOAGULATION CONSULT NOTE - Follow Up Consult  Pharmacy Consult for heparin Indication: acute pulmonary embolus and DVT  Allergies  Allergen Reactions  . Ciprofloxacin Other (See Comments)    Leg tingling    Patient Measurements: Height: 5' 9"  (175.3 cm) Weight: 260 lb (117.9 kg) IBW/kg (Calculated) : 70.7 Heparin Dosing Weight: 97 kg  Vital Signs: Temp: 98 F (36.7 C) (06/30 0430) Temp Source: Oral (06/29 2233) BP: 148/82 (06/30 0430) Pulse Rate: 100 (06/30 0430)  Labs: Recent Labs    03/10/18 1530  03/10/18 1847 03/10/18 1914 03/11/18 0152 03/11/18 0733  03/11/18 1013 03/11/18 1803 03/12/18 0037 03/12/18 0853  HGB 8.5*  --   --   --  7.5* 8.2*  --   --  8.4* 8.1* 8.8*  HCT 27.6*  --   --   --  24.7* 26.5*  --   --  27.4* 26.0* 28.5*  PLT 488*  --   --   --  442* 476*  --   --  529* 498* 511*  APTT  --    < > 32  --  37*  --    < > 38* 41* 60*  --   LABPROT  --   --  18.0*  --   --   --   --   --   --   --   --   INR  --   --  1.50  --   --   --   --   --   --   --   --   HEPARINUNFRC  --   --  1.88*  --  1.06*  --   --   --   --  0.44  --   CREATININE 1.11  --   --   --  1.03  --   --   --   --   --   --   TROPONINI  --   --   --  <0.03 <0.03 <0.03  --   --   --   --   --    < > = values in this interval not displayed.    Estimated Creatinine Clearance: 95.4 mL/min (by C-G formula based on SCr of 1.03 mg/dL).   Medications:  xarelto 15 mg bid PTA (last dose taken 6/28 at 0730(  Assessment: Patient is a 62 y.o M presented to his PCP office on 6/27 with c/o right leg edema.  PCP sent patient home on xarelto 15 mg bid and sent for LE doppler on 6/28. Outpatient LE doppler showed acute right DVT involving the right saphenofemoral junction, right common femoral vein, posterior tibial and peroneal veins. He was sent to the ED on 6/28 for further workup and management of thromboembolism. Chest CTA on 6/28 showed acute bilateral PE with evidence of right hear  strain and abd CT showed abdominal mass. Oncology consulted for intra-abdominal tumor. Heparin started on admission for acute DVT/PE.  - 6/28: hemeoccult positive  Today, 03/12/2018: - aPTT is therapeutic at 73 - cbc relatively stable - no new active bleeding documented   Goal of Therapy:  Heparin level 0.3-0.7 units/ml aPTT 66-102 seconds, aim for 66-85 secs and heparin level 0.3-0.5 d/t risk for GIB with abd mass Monitor platelets by anticoagulation protocol: Yes   Plan:  - continue heparin drip at 2500 units/hr - check 6 hr aPTT and heparin level to confirm before changing to daily levels monitoring - monitor for s/s bleeding -  plan for abdominal mass biopsy this week. Please advise if/when you want heparin drip to be held for procedure  Arnold Depinto P 03/12/2018,9:18 AM  _______________________________  Adden (4:13PM):  aPTT is therapeutic at 66, but is at lower end of goal range. Heparin level is 0.47. The two levels are not correlating yet-- hence, will dose base on aPTT for now. - increase heparin drip slightly to 2550 units/hr - f/u with AM levels at 0500 - IR to perform biopsy on 7/01.  Spoke to Dr. Vernard Gambles-- recom. To turn off heparin at 0800 on 7/1(or about 6 hrs prior to procedure) in anticipation for procedure. Dia Sitter, PharmD, BCPS 03/12/2018 4:19 PM

## 2018-03-12 NOTE — Progress Notes (Signed)
PROGRESS NOTE    Mark CASSETTA  [name]:[id number] DOB: May 16, 1956 DOA: 03/10/2018 PCP: [name], MD   Brief Narrative: Mark Frey is a 62 y.o. male with a history of diabetes mellitus that presented secondary to DVT found on ultrasound. He was also found to have a massive bilateral PE in addition to concern for abdominal malignancy on CT scan. Started on heparin IV.   Assessment & Plan:   Active Problems:   Hyperlipidemia   Controlled diabetes mellitus type II without complication (HCC)   Hypertension   Bilateral pulmonary embolism (HCC)   Anemia   Hypoglycemia   Occult blood in stools   Intraabdominal mass   Bilateral pulmonary embolism Involving lobar, segmental and subsegmental branches bilaterally with evidence of heart strain. Not a tPA candidate per PCCM. Started on heparin drip. Transthoracic Echocardiogram not significant for heart strain -Continue Heparin drip until biopsy of abdominal mass obtained  DVT Right saphenofemoral junction, right common femoral vein, posterior tibial/peroneal veins. -Management above  Intraabdominal mass Large. Concern for malignancy. Oncology consulted. CT chest without evidence of metastatic disease -IR consult for biopsy  Elevated blood pressure No history of hypertension. Currently still uncontrolled.  -hydralazine prn  Diabetes mellitus, type 2 Well controlled. Hemoglobin A1C of 5.4%. On Janumet and glimepiride as an outpatient. -SSI while inpatient  Heme positive stool Normocytic anemia No history of colonoscopy. Iron and iron sat low -outpatient GI referral -outpatient follow-up with heme/onc   DVT prophylaxis: Heparin gtt Code Status:   Code Status: Full Code Family Communication: None at bedside; wife on telephone Disposition Plan: Discharge home tomorrow after biopsy   Consultants:   PCCM  Oncology  IR  Procedures:   None  Antimicrobials:  None    Subjective: No issues  today.  Objective: Vitals:   03/11/18 1522 03/11/18 1631 03/11/18 2233 03/12/18 0430  BP: (!) 151/75 (!) 158/95 114/76 (!) 148/82  Pulse:  (!) 107 71 100  Resp: 17 16 19 18   Temp:   98.6 F (37 C) 98 F (36.7 C)  TempSrc:   Oral   SpO2: 95% 95%  97%  Weight:      Height:        Intake/Output Summary (Last 24 hours) at 03/12/2018 1325 Last data filed at 03/12/2018 0730 Gross per 24 hour  Intake 389.98 ml  Output 550 ml  Net -160.02 ml   Filed Weights   03/10/18 1215  Weight: 117.9 kg (260 lb)    Examination:  General exam: Appears calm and comfortable Respiratory system: Clear to auscultation. Respiratory effort normal. Cardiovascular system: S1 & S2 heard, RRR. No murmurs, rubs, gallops or clicks. Central nervous system: Alert and oriented. No focal neurological deficits. Extremities: No edema. No calf tenderness Skin: No cyanosis. No rashes Psychiatry: Judgement and insight appear normal. Mood & affect appropriate.     Data Reviewed: I have personally reviewed following labs and imaging studies  CBC: Recent Labs  Lab 03/11/18 0152 03/11/18 0733 03/11/18 1803 03/12/18 0037 03/12/18 0853  WBC 7.6 7.6 8.1 7.9 9.0  HGB 7.5* 8.2* 8.4* 8.1* 8.8*  HCT 24.7* 26.5* 27.4* 26.0* 28.5*  MCV 85.8 86.3 86.4 86.4 86.4  PLT 442* 476* 529* 498* 250*   Basic Metabolic Panel: Recent Labs  Lab 03/10/18 1530 03/11/18 0152  NA 142 141  K 3.6 3.6  CL 108 109  CO2 23 22  GLUCOSE 53* 74  BUN 17 15  CREATININE 1.11 1.03  CALCIUM 9.2 8.9  MG  --  1.7  PHOS  --  3.1   GFR: Estimated Creatinine Clearance: 95.4 mL/min (by C-G formula based on SCr of 1.03 mg/dL). Liver Function Tests: Recent Labs  Lab 03/10/18 1530 03/11/18 0152  AST 21 19  ALT 14 15  ALKPHOS 32* 29*  BILITOT 0.5 0.3  PROT 7.3 6.5  ALBUMIN 3.3* 2.8*   No results for input(s): LIPASE, AMYLASE in the last 168 hours. No results for input(s): AMMONIA in the last 168 hours. Coagulation  Profile: Recent Labs  Lab 03/10/18 1847  INR 1.50   Cardiac Enzymes: Recent Labs  Lab 03/10/18 1914 03/11/18 0152 03/11/18 0733  TROPONINI <0.03 <0.03 <0.03   BNP (last 3 results) No results for input(s): PROBNP in the last 8760 hours. HbA1C: Recent Labs    03/10/18 2151  HGBA1C 5.4   CBG: Recent Labs  Lab 03/11/18 2014 03/12/18 0100 03/12/18 0428 03/12/18 0722 03/12/18 1204  GLUCAP 93 83 86 87 106*   Lipid Profile: No results for input(s): CHOL, HDL, LDLCALC, TRIG, CHOLHDL, LDLDIRECT in the last 72 hours. Thyroid Function Tests: Recent Labs    03/11/18 0152  TSH 0.579   Anemia Panel: Recent Labs    03/11/18 0152  VITAMINB12 413  FOLATE 6.8  FERRITIN 90  TIBC 368  IRON 25*  RETICCTPCT 4.3*   Sepsis Labs: No results for input(s): PROCALCITON, LATICACIDVEN in the last 168 hours.  Recent Results (from the past 240 hour(s))  MRSA PCR Screening     Status: None   Collection Time: 03/10/18 10:05 PM  Result Value Ref Range Status   MRSA by PCR NEGATIVE NEGATIVE Final    Comment:        The GeneXpert MRSA Assay (FDA approved for NASAL specimens only), is one component of a comprehensive MRSA colonization surveillance program. It is not intended to diagnose MRSA infection nor to guide or monitor treatment for MRSA infections. Performed at [facility], [place] [address], [place], [place] [address]          Radiology Studies: Ct Angio Chest Pe W/cm &/or Wo Cm  Result Date: 03/10/2018 CLINICAL DATA:  Acute DVT in the right lower extremity by report. Palpable right abdominal mass. EXAM: CT ANGIOGRAPHY CHEST CT ABDOMEN AND PELVIS WITH CONTRAST TECHNIQUE: Multidetector CT imaging of the chest was performed using the standard protocol during bolus administration of intravenous contrast. Multiplanar CT image reconstructions and MIPs were obtained to evaluate the vascular anatomy. Multidetector CT imaging of the abdomen and pelvis was  performed using the standard protocol during bolus administration of intravenous contrast. CONTRAST:  149m ISOVUE-370 IOPAMIDOL (ISOVUE-370) INJECTION 76% COMPARISON:  None. FINDINGS: CTA CHEST FINDINGS Cardiovascular: The study is moderate quality for the evaluation of pulmonary embolism, limited by motion degradation particularly in the lower lungs. There are lobar, segmental and subsegmental pulmonary emboli in the left upper, left lower and right upper lobes. There are segmental and subsegmental pulmonary emboli in the right lower lobe. No central or saddle pulmonary emboli. Dilated main pulmonary artery (3.5 cm diameter). Normal course and caliber of thoracic aorta. Top-normal heart size. Elevated RV/LV ratio 1.1. No significant pericardial fluid/thickening. Left anterior descending coronary atherosclerosis. Mediastinum/Nodes: No discrete thyroid nodules. Unremarkable esophagus. No pathologically enlarged axillary, mediastinal or hilar lymph nodes. Lungs/Pleura: No pneumothorax. No pleural effusion. Subpleural 2 mm basilar left lower lobe solid pulmonary nodule (series 4/image 122). No acute consolidative airspace disease, lung masses or additional significant pulmonary nodules. Musculoskeletal: No aggressive appearing focal osseous lesions. Moderate thoracic  spondylosis. Review of the MIP images confirms the above findings. CT ABDOMEN and PELVIS FINDINGS Hepatobiliary: Normal liver size. Solitary subcentimeter hypodense right liver dome lesion (series 4/image 11), too small to characterize. No additional liver lesions. Normal gallbladder with no radiopaque cholelithiasis. No biliary ductal dilatation. Pancreas: Normal, with no mass or duct dilation. Spleen: Normal size. No mass. Adrenals/Urinary Tract: Normal adrenals. Mild right hydronephrosis with ill-defined soft tissue encasing the right ureteropelvic junction. Nonobstructing 5 mm stone in the posterior lower right kidney. No additional renal stones. No  left hydronephrosis. A few subcentimeter hypodense renal cortical lesions scattered in the kidneys bilaterally, too small to characterize. Normal caliber left ureter. Masslike wall thickening in the distal pelvic segment of the right ureter extending to the right ureterovesical junction measuring up to 2.5 x 1.9 x 5.0 cm (series 4/image 72). Otherwise normal nondistended bladder. Stomach/Bowel: Normal non-distended stomach. There is a large irregular infiltrative solid mass in the right lower quadrant measuring up to 18.7 x 18.5 x 17.8 cm (series 4/image 57), which infiltrates and encases multiple distal small bowel loops and likely the ileocecal region. There is partial encasement of the sigmoid colon by the mass. There is prominent extension of the mass into the right lower retroperitoneum and extraperitoneal right pelvis with encasement of the right external iliac and proximal right common iliac vasculature and with infiltration of the right iliopsoas muscle. There is dilatation of the involved right sided small bowel loops. No proximal small bowel dilatation. The transverse and left colon are collapsed with no wall thickening in these locations. Moderate sigmoid diverticulosis. Vascular/Lymphatic: Atherosclerotic nonaneurysmal abdominal aorta. Patent portal, splenic, hepatic and renal veins. There is bulky soft tissue throughout right external and common iliac nodal chains. Reproductive: Top-normal size prostate with coarse nonspecific internal prostatic calcifications. Other: No pneumoperitoneum, ascites or focal fluid collection. Trace presacral fluid. Asymmetric fat stranding throughout subcutaneous right proximal lower extremity and right flank. Musculoskeletal: No aggressive appearing focal osseous lesions. Moderate lumbar spondylosis. Review of the MIP images confirms the above findings. IMPRESSION: CT CHEST: 1. Acute bilateral pulmonary embolism involving lobar, segmental and subsegmental branches. No  saddle pulmonary emboli. Positive for acute PE with CT evidence of right heart strain (RV/LV Ratio = 1.1) consistent with at least submassive (intermediate risk) PE. The presence of right heart strain has been associated with an increased risk of morbidity and mortality. Please activate Code PE by paging [phone]. 2. Dilated main pulmonary artery, suggesting pulmonary arterial hypertension. 3. 1 vessel coronary atherosclerosis. 4. No findings suspicious for metastatic disease in the chest. Solitary 2 mm subpleural left lower lobe solid pulmonary nodule, for which follow-up chest CT is advised in 3 months. CT ABDOMEN and PELVIS: 1. Large irregular infiltrative solid mass in the right lower quadrant measuring up to 18.7 x 18.5 x 17.8 cm, infiltrating and encasing multiple distal small bowel loops and likely the ileocecal region, partially encasing the sigmoid colon, with prominent extension into the right lower retroperitoneum and extraperitoneal right pelvis with encasement of right external iliac and proximal right common iliac vasculature and infiltration of the right iliopsoas muscle. 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 Differential includes small bowel GIST or adenocarcinoma. 2. Mild right hydronephrosis due to thick soft tissue encasing the right ureter most prominently at the right UPJ and right UVJ. 3. Nonobstructing right nephrolithiasis. 4. Moderate sigmoid diverticulosis. 5.  Aortic Atherosclerosis (ICD10-I70.0). Critical Value/emergent results were called by telephone at the time of interpretation on 03/10/2018 at 5:38 pm to Dr. Kennith Maes ,  who verbally acknowledged these results. Electronically Signed   By: Ilona Sorrel M.D.   On: 03/10/2018 17:41   Ct Abdomen Pelvis W Contrast  Result Date: 03/10/2018 CLINICAL DATA:  Acute DVT in the right lower extremity by report. Palpable right abdominal mass. EXAM: CT ANGIOGRAPHY CHEST CT ABDOMEN AND PELVIS WITH CONTRAST TECHNIQUE: Multidetector CT  imaging of the chest was performed using the standard protocol during bolus administration of intravenous contrast. Multiplanar CT image reconstructions and MIPs were obtained to evaluate the vascular anatomy. Multidetector CT imaging of the abdomen and pelvis was performed using the standard protocol during bolus administration of intravenous contrast. CONTRAST:  115m ISOVUE-370 IOPAMIDOL (ISOVUE-370) INJECTION 76% COMPARISON:  None. FINDINGS: CTA CHEST FINDINGS Cardiovascular: The study is moderate quality for the evaluation of pulmonary embolism, limited by motion degradation particularly in the lower lungs. There are lobar, segmental and subsegmental pulmonary emboli in the left upper, left lower and right upper lobes. There are segmental and subsegmental pulmonary emboli in the right lower lobe. No central or saddle pulmonary emboli. Dilated main pulmonary artery (3.5 cm diameter). Normal course and caliber of thoracic aorta. Top-normal heart size. Elevated RV/LV ratio 1.1. No significant pericardial fluid/thickening. Left anterior descending coronary atherosclerosis. Mediastinum/Nodes: No discrete thyroid nodules. Unremarkable esophagus. No pathologically enlarged axillary, mediastinal or hilar lymph nodes. Lungs/Pleura: No pneumothorax. No pleural effusion. Subpleural 2 mm basilar left lower lobe solid pulmonary nodule (series 4/image 122). No acute consolidative airspace disease, lung masses or additional significant pulmonary nodules. Musculoskeletal: No aggressive appearing focal osseous lesions. Moderate thoracic spondylosis. Review of the MIP images confirms the above findings. CT ABDOMEN and PELVIS FINDINGS Hepatobiliary: Normal liver size. Solitary subcentimeter hypodense right liver dome lesion (series 4/image 11), too small to characterize. No additional liver lesions. Normal gallbladder with no radiopaque cholelithiasis. No biliary ductal dilatation. Pancreas: Normal, with no mass or duct dilation.  Spleen: Normal size. No mass. Adrenals/Urinary Tract: Normal adrenals. Mild right hydronephrosis with ill-defined soft tissue encasing the right ureteropelvic junction. Nonobstructing 5 mm stone in the posterior lower right kidney. No additional renal stones. No left hydronephrosis. A few subcentimeter hypodense renal cortical lesions scattered in the kidneys bilaterally, too small to characterize. Normal caliber left ureter. Masslike wall thickening in the distal pelvic segment of the right ureter extending to the right ureterovesical junction measuring up to 2.5 x 1.9 x 5.0 cm (series 4/image 72). Otherwise normal nondistended bladder. Stomach/Bowel: Normal non-distended stomach. There is a large irregular infiltrative solid mass in the right lower quadrant measuring up to 18.7 x 18.5 x 17.8 cm (series 4/image 57), which infiltrates and encases multiple distal small bowel loops and likely the ileocecal region. There is partial encasement of the sigmoid colon by the mass. There is prominent extension of the mass into the right lower retroperitoneum and extraperitoneal right pelvis with encasement of the right external iliac and proximal right common iliac vasculature and with infiltration of the right iliopsoas muscle. There is dilatation of the involved right sided small bowel loops. No proximal small bowel dilatation. The transverse and left colon are collapsed with no wall thickening in these locations. Moderate sigmoid diverticulosis. Vascular/Lymphatic: Atherosclerotic nonaneurysmal abdominal aorta. Patent portal, splenic, hepatic and renal veins. There is bulky soft tissue throughout right external and common iliac nodal chains. Reproductive: Top-normal size prostate with coarse nonspecific internal prostatic calcifications. Other: No pneumoperitoneum, ascites or focal fluid collection. Trace presacral fluid. Asymmetric fat stranding throughout subcutaneous right proximal lower extremity and right flank.  Musculoskeletal: No  aggressive appearing focal osseous lesions. Moderate lumbar spondylosis. Review of the MIP images confirms the above findings. IMPRESSION: CT CHEST: 1. Acute bilateral pulmonary embolism involving lobar, segmental and subsegmental branches. No saddle pulmonary emboli. Positive for acute PE with CT evidence of right heart strain (RV/LV Ratio = 1.1) consistent with at least submassive (intermediate risk) PE. The presence of right heart strain has been associated with an increased risk of morbidity and mortality. Please activate Code PE by paging [phone]. 2. Dilated main pulmonary artery, suggesting pulmonary arterial hypertension. 3. 1 vessel coronary atherosclerosis. 4. No findings suspicious for metastatic disease in the chest. Solitary 2 mm subpleural left lower lobe solid pulmonary nodule, for which follow-up chest CT is advised in 3 months. CT ABDOMEN and PELVIS: 1. Large irregular infiltrative solid mass in the right lower quadrant measuring up to 18.7 x 18.5 x 17.8 cm, infiltrating and encasing multiple distal small bowel loops and likely the ileocecal region, partially encasing the sigmoid colon, with prominent extension into the right lower retroperitoneum and extraperitoneal right pelvis with encasement of right external iliac and proximal right common iliac vasculature and infiltration of the right iliopsoas muscle. 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 Differential includes small bowel GIST or adenocarcinoma. 2. Mild right hydronephrosis due to thick soft tissue encasing the right ureter most prominently at the right UPJ and right UVJ. 3. Nonobstructing right nephrolithiasis. 4. Moderate sigmoid diverticulosis. 5.  Aortic Atherosclerosis (ICD10-I70.0). Critical Value/emergent results were called by telephone at the time of interpretation on 03/10/2018 at 5:38 pm to Dr. Kennith Maes , who verbally acknowledged these results. Electronically Signed   By: Ilona Sorrel M.D.   On:  03/10/2018 17:41        Scheduled Meds: . docusate sodium  100 mg Oral BID  . fenofibrate  160 mg Oral Daily  . insulin aspart  0-9 Units Subcutaneous Q4H   Continuous Infusions: . heparin 2,500 Units/hr (03/12/18 0351)     LOS: 2 days     Cordelia Poche, MD [facility] 03/12/2018, 1:25 PM Pager: [phone]  If 7PM-7AM, please contact night-coverage [email or web address] 03/12/2018, 1:25 PM

## 2018-03-12 NOTE — Plan of Care (Signed)

## 2018-03-13 ENCOUNTER — Inpatient Hospital Stay (HOSPITAL_COMMUNITY): Payer: BLUE CROSS/BLUE SHIELD

## 2018-03-13 DIAGNOSIS — R710 Precipitous drop in hematocrit: Secondary | ICD-10-CM

## 2018-03-13 DIAGNOSIS — C8519 Unspecified B-cell lymphoma, extranodal and solid organ sites: Secondary | ICD-10-CM | POA: Diagnosis not present

## 2018-03-13 DIAGNOSIS — R195 Other fecal abnormalities: Secondary | ICD-10-CM

## 2018-03-13 LAB — GLUCOSE, CAPILLARY
GLUCOSE-CAPILLARY: 86 mg/dL (ref 70–99)
GLUCOSE-CAPILLARY: 142 mg/dL — AB (ref 70–99)
Glucose-Capillary: 125 mg/dL — ABNORMAL HIGH (ref 70–99)
Glucose-Capillary: 74 mg/dL (ref 70–99)
Glucose-Capillary: 87 mg/dL (ref 70–99)
Glucose-Capillary: 89 mg/dL (ref 70–99)
Glucose-Capillary: 93 mg/dL (ref 70–99)
Glucose-Capillary: 94 mg/dL (ref 70–99)

## 2018-03-13 LAB — CBC
HEMATOCRIT: 24.8 % — AB (ref 39.0–52.0)
HEMOGLOBIN: 7.7 g/dL — AB (ref 13.0–17.0)
MCH: 26.6 pg (ref 26.0–34.0)
MCHC: 31 g/dL (ref 30.0–36.0)
MCV: 85.5 fL (ref 78.0–100.0)
Platelets: 505 10*3/uL — ABNORMAL HIGH (ref 150–400)
RBC: 2.9 MIL/uL — AB (ref 4.22–5.81)
RDW: 17.1 % — ABNORMAL HIGH (ref 11.5–15.5)
WBC: 8.5 10*3/uL (ref 4.0–10.5)

## 2018-03-13 LAB — PROTIME-INR
INR: 1.13
Prothrombin Time: 14.4 seconds (ref 11.4–15.2)

## 2018-03-13 LAB — APTT: APTT: 48 s — AB (ref 24–36)

## 2018-03-13 LAB — HEPARIN LEVEL (UNFRACTIONATED): HEPARIN UNFRACTIONATED: 0.22 [IU]/mL — AB (ref 0.30–0.70)

## 2018-03-13 MED ORDER — HEPARIN (PORCINE) IN NACL 100-0.45 UNIT/ML-% IJ SOLN
2700.0000 [IU]/h | INTRAMUSCULAR | Status: AC
  Administered 2018-03-14 (×3): 2700 [IU]/h via INTRAVENOUS
  Filled 2018-03-13 (×3): qty 250

## 2018-03-13 MED ORDER — FENTANYL CITRATE (PF) 100 MCG/2ML IJ SOLN
INTRAMUSCULAR | Status: AC | PRN
  Administered 2018-03-13 (×2): 50 ug via INTRAVENOUS

## 2018-03-13 MED ORDER — FENTANYL CITRATE (PF) 100 MCG/2ML IJ SOLN
INTRAMUSCULAR | Status: AC
  Filled 2018-03-13: qty 2

## 2018-03-13 MED ORDER — HEPARIN (PORCINE) IN NACL 100-0.45 UNIT/ML-% IJ SOLN: 2700.0000 [IU]/h | INTRAMUSCULAR | Status: AC

## 2018-03-13 MED ORDER — MIDAZOLAM HCL 2 MG/2ML IJ SOLN
INTRAMUSCULAR | Status: AC
  Filled 2018-03-13: qty 4

## 2018-03-13 MED ORDER — LIDOCAINE HCL (PF) 1 % IJ SOLN
INTRAMUSCULAR | Status: AC | PRN
  Administered 2018-03-13: 20 mL

## 2018-03-13 MED ORDER — DEXTROSE 50 % IV SOLN
INTRAVENOUS | Status: AC
  Administered 2018-03-13: 50 mL
  Filled 2018-03-13: qty 50

## 2018-03-13 MED ORDER — MIDAZOLAM HCL 2 MG/2ML IJ SOLN
INTRAMUSCULAR | Status: AC | PRN
  Administered 2018-03-13 (×2): 1 mg via INTRAVENOUS

## 2018-03-13 NOTE — Progress Notes (Signed)
Hypoglycemic Event  CBG:74   Treatment: D50 IV 25 mL  Symptoms: None  Follow-up CBG: Time: 1600  CBG Result:125  Possible Reasons for Event: Inadequate meal intake      Mark Frey, Laurel Dimmer

## 2018-03-13 NOTE — Progress Notes (Signed)
ANTICOAGULATION CONSULT NOTE - Follow Up Consult  Pharmacy Consult for heparin Indication: acute pulmonary embolus and DVT  Allergies  Allergen Reactions  . Ciprofloxacin Other (See Comments)    Leg tingling    Patient Measurements: Height: 5' 9"  (175.3 cm) Weight: 260 lb (117.9 kg) IBW/kg (Calculated) : 70.7 Heparin Dosing Weight: 97 kg  Vital Signs: Temp: 98.8 F (37.1 C) (07/01 0428) Temp Source: Oral (07/01 0428) BP: 137/80 (07/01 0428) Pulse Rate: 99 (07/01 0428)  Labs: Recent Labs    03/10/18 1530  03/10/18 1847 03/10/18 1914 03/11/18 0152 03/11/18 0923  03/12/18 0037 03/12/18 0853 03/12/18 1445 03/13/18 0526  HGB 8.5*  --   --   --  7.5* 8.2*   < > 8.1* 8.8*  --  7.7*  HCT 27.6*  --   --   --  24.7* 26.5*   < > 26.0* 28.5*  --  24.8*  PLT 488*  --   --   --  442* 476*   < > 498* 511*  --  505*  APTT  --    < > 32  --  37*  --    < > 60* 73* 66* 48*  LABPROT  --   --  18.0*  --   --   --   --   --   --   --  14.4  INR  --   --  1.50  --   --   --   --   --   --   --  1.13  HEPARINUNFRC  --    < > 1.88*  --  1.06*  --   --  0.44  --  0.47 0.22*  CREATININE 1.11  --   --   --  1.03  --   --   --   --   --   --   TROPONINI  --   --   --  <0.03 <0.03 <0.03  --   --   --   --   --    < > = values in this interval not displayed.    Estimated Creatinine Clearance: 95.4 mL/min (by C-G formula based on SCr of 1.03 mg/dL).   Medications:  xarelto 15 mg bid PTA (last dose taken 6/28 at 0730(  Assessment: Patient is a 61 y.o M presented to his PCP office on 6/27 with c/o right leg edema.  PCP sent patient home on xarelto 15 mg bid and sent for LE doppler on 6/28. Outpatient LE doppler showed acute right DVT involving the right saphenofemoral junction, right common femoral vein, posterior tibial and peroneal veins. He was sent to the ED on 6/28 for further workup and management of thromboembolism. Chest CTA on 6/28 showed acute bilateral PE with evidence of right hear  strain and abd CT showed abdominal mass. Oncology consulted for intra-abdominal tumor. Heparin started on admission for acute DVT/PE.  - 6/28: hemeoccult positive  Today, 03/13/2018: - aPTT is subtherapeutic at 48 seconds and heparin level is below goal at 0.22 after heparin rate increased to 2550 units/hr - Hg 8.8>7.7, PTLC remains elevated - no new active bleeding or IV issues per RN report   Goal of Therapy:  Heparin level 0.3-0.7 units/ml aPTT 66-102 seconds, aim for 66-85 secs and heparin level 0.3-0.5 d/t risk for GIB with abd mass Monitor platelets by anticoagulation protocol: Yes   Pla -increase heparin drip to 2700 unit/shr now then off at 08 am for  IR - IR to perform biopsy on 7/01.  Spoke to Dr. Vernard Gambles-- recom. To turn off heparin at 0800 on 7/1(or about 6 hrs prior to procedure) in anticipation for procedure. - f/u plans after procedure  Eudelia Bunch, Pharm.D. [phone] 03/13/2018 6:35 AM

## 2018-03-13 NOTE — Procedures (Signed)
Interventional Radiology Procedure Note  Procedure: CT guided biopsy of right abdominal mass   Complications: None  Estimated Blood Loss: < 10 mL  Findings: RLQ portion of large peritoneal mass sampled via 17 G needle.  18 G core biopsy samples submitted in saline and formalin.  Venetia Night. Kathlene Cote, M.D Pager:  [phone]

## 2018-03-13 NOTE — Consult Note (Addendum)
Referring Provider: [facility]  Primary Care Physician:  [name], MD Primary Gastroenterologist:  unassigned  Reason for Consultation:   Anemia , heme positive stool    ASSESSMENT AND PLAN:    5. 63 yo male with new DVT / bilateral PE / large infiltrating right sided intraabdominal mass encasing small bowel , part of sigmoid and extending into other areas including right retroperitoneum and right psoas. Lymphoma? GIST? Started on Xarelto a few days ago, now on heparin gtt.  -IR to biopsy mass later today  2. New normocytic anemia / heme + stool, Drop in hgb from 14 to 7.7 (prior to initiation of Xarelto). Occasional scant blood with BM, no over overt GI bleeding.  -Await biopsy report from large mass. Will probably need endoscopic evaluation at some point.     HPI: Mark Frey is a 62 y.o. male with HTN, DM2, and obesity. A couple of weeks ago he developed RLE swelling after crawling under home to make a repair. Patient initially thought he had been bitten by a spider. Began taking NSAIDs for the inflammation. Saw PCP end of June, found to have right sided abdominal mass. Patient apparently didn't want to go to ED so Xarelto started while outpatient workup being done. Sent for RLE u/s confirming DVT. CTA chest revealed acute bilateral PE with dilated PA. CT abd/pelvis remarkable for large infiltrative mass in RLQ enxasing multiple distal SB loose, part of sigmoid and extending into other areas including retroperitoneum and right iliopsoas muscle. He has been trying to lose weight and is down about 10 pounds. No fevers or night sweats. No abdominal pain, N/V.    Hgb in Dec was 14, down to 7.7 now. He occasionally has scant BRB with BMs. Prior to a couple of weeks ago he seldom took NSAIDS. He gives a hx of IBS manifested as "squiggly" stools sometimes. No other GI complaints. He has never had a colonoscopy. No [place] of colon cancer.   Past Medical History:  Diagnosis Date    . ALLERGIC RHINITIS   . Diabetes mellitus   . Hyperlipidemia     Past Surgical History:  Procedure Laterality Date  . CLEFT PALATE REPAIR    . deviated septum repair     slight improvement  . TONSILLECTOMY      Prior to Admission medications   Medication Sig Start Date End Date Taking? Authorizing Provider  aspirin 81 MG tablet Take 81 mg by mouth daily.   Yes [provider]  Choline Fenofibrate 135 MG capsule TAKE 1 CAPSULE DAILY 11/29/17  Yes [name], MD  glimepiride (AMARYL) 2 MG tablet Take 1 tablet (2 mg total) by mouth daily before breakfast. Patient taking differently: Take 1 mg by mouth daily before breakfast.  12/28/17  Yes [name], MD  ibuprofen (ADVIL,MOTRIN) 200 MG tablet Take 200 mg by mouth every 6 (six) hours as needed for moderate pain.   Yes [provider]  JANUMET 50-1000 MG tablet TAKE 1 TABLET TWICE DAILY  WITH MEALS 11/29/17  Yes [name], MD  Multiple Vitamin (MULTIVITAMIN) capsule Take 1 capsule by mouth daily.     Yes [provider]  RIVAROXABAN PO Take 1 mg by mouth daily with supper.   Yes [provider]  glucose blood (FREESTYLE TEST STRIPS) test strip 1 each by Other route daily. Use as instructed     [provider]  Lancets (FREESTYLE) lancets 1 each by Other route as  directed. Use as instructed     [provider]    Current Facility-Administered Medications  Medication Dose Route Frequency Provider Last Rate Last Dose  . acetaminophen (TYLENOL) tablet 650 mg  650 mg Oral Q6H PRN [name], MD       Or  . acetaminophen (TYLENOL) suppository 650 mg  650 mg Rectal Q6H PRN Doutova, Anastassia, MD      . docusate sodium (COLACE) capsule 100 mg  100 mg Oral BID [name], MD   100 mg at 03/12/18 2027  . fenofibrate tablet 160 mg  160 mg Oral Daily [name], MD   160 mg at 03/12/18 0956  . hydrALAZINE (APRESOLINE) injection 5 mg  5 mg  Intravenous Q6H PRN Mariel Aloe, MD      . HYDROcodone-acetaminophen (NORCO/VICODIN) 5-325 MG per tablet 1-2 tablet  1-2 tablet Oral Q4H PRN Doutova, Anastassia, MD      . insulin aspart (novoLOG) injection 0-9 Units  0-9 Units Subcutaneous Q4H Doutova, Anastassia, MD      . ondansetron (ZOFRAN) tablet 4 mg  4 mg Oral Q6H PRN Doutova, Anastassia, MD       Or  . ondansetron (ZOFRAN) injection 4 mg  4 mg Intravenous Q6H PRN Doutova, Anastassia, MD      . polyethylene glycol (MIRALAX / GLYCOLAX) packet 17 g  17 g Oral Daily PRN [name], MD        Allergies as of 03/10/2018 - Review Complete 03/10/2018  Allergen Reaction Noted  . Ciprofloxacin Other (See Comments) 01/30/2018    Family History  Problem Relation Age of Onset  . Lung cancer Mother        smoker  . Brain cancer Mother        metastasis  . AAA (abdominal aortic aneurysm) Father        smoker    Social History   Socioeconomic History  . Marital status: Married    Spouse name: Not on file  . Number of children: Not on file  . Years of education: Not on file  . Highest education level: Not on file  Occupational History  . Not on file  Social Needs  . Financial resource strain: Not on file  . Food insecurity:    Worry: Not on file    Inability: Not on file  . Transportation needs:    Medical: Not on file    Non-medical: Not on file  Tobacco Use  . Smoking status: Never Smoker  . Smokeless tobacco: Never Used  Substance and Sexual Activity  . Alcohol use: Yes    Comment: occasional  . Drug use: No  . Sexual activity: Yes  Lifestyle  . Physical activity:    Days per week: Not on file    Minutes per session: Not on file  . Stress: Not on file  Relationships  . Social connections:    Talks on phone: Not on file    Gets together: Not on file    Attends religious service: Not on file    Active member of club or organization: Not on file    Attends meetings of clubs or organizations: Not on  file    Relationship status: Not on file  . Intimate partner violence:    Fear of current or ex partner: Not on file    Emotionally abused: Not on file    Physically abused: Not on file    Forced sexual activity: Not on file  Other Topics  Concern  . Not on file  Social History Narrative   Married 1985. No kids. 4 small dogs.       Works in Financial trader, residential      Hobbies: work on cars, Haematologist, exercise as able    Review of Systems: All systems reviewed and negative except where noted in HPI.  Physical Exam: Vital signs in last 24 hours: Temp:  [97.9 F (36.6 C)-99.1 F (37.3 C)] 98.8 F (37.1 C) (07/01 0428) Pulse Rate:  [99-111] 99 (07/01 0428) Resp:  [16-18] 18 (07/01 0428) BP: (137-149)/(75-80) 137/80 (07/01 0428) SpO2:  [95 %-98 %] 95 % (07/01 0428) Last BM Date: 03/12/18 General:   Alert, male in NAD Psych:  Pleasant, cooperative. Normal mood and affect. Eyes:  Pupils equal, sclera clear, no icterus.   Conjunctiva pink. Ears:  Normal auditory acuity. Nose:  No deformity, discharge,  or lesions. Neck:  Supple; no masses Lungs:  Clear throughout to auscultation.   No wheezes, crackles, or rhonchi.  Heart:  Regular rate and rhythm; no murmurs, no edema Abdomen:  Soft, non-distended, nontender. Very firm, large mass in RLQ and partly in RUQ.  BS active   Rectal:  Deferred\ EXT:  2+ RLE edema Msk:  Symmetrical without gross deformities. . Neurologic:  Alert and  oriented x4;  grossly normal neurologically. Skin:  Intact without significant lesions or rashes..   Intake/Output from previous day: 06/30 0701 - 07/01 0700 In: 278.6 [I.V.:278.6] Out: 975 [Urine:975] Intake/Output this shift: Total I/O In: 35.6 [I.V.:35.6] Out: 200 [Urine:200]  Lab Results: Recent Labs    03/12/18 0037 03/12/18 0853 03/13/18 0526  WBC 7.9 9.0 8.5  HGB 8.1* 8.8* 7.7*  HCT 26.0* 28.5* 24.8*  PLT 498* 511* 505*   BMET Recent Labs    03/10/18 1530  03/11/18 0152  NA 142 141  K 3.6 3.6  CL 108 109  CO2 23 22  GLUCOSE 53* 74  BUN 17 15  CREATININE 1.11 1.03  CALCIUM 9.2 8.9   LFT Recent Labs    03/11/18 0152  PROT 6.5  ALBUMIN 2.8*  AST 19  ALT 15  ALKPHOS 29*  BILITOT 0.3   PT/INR Recent Labs    03/10/18 1847 03/13/18 0526  LABPROT 18.0* 14.4  INR 1.50 1.13    Tye Savoy, NP-C @  03/13/2018, 12:24 PM

## 2018-03-13 NOTE — Progress Notes (Signed)
MEDICATION-RELATED CONSULT NOTE   IR Procedure Consult - Anticoagulant/Antiplatelet PTA/Inpatient Med List Review by Pharmacist    Procedure: CT guided biopsy of right abdominal mass      Completed: 7/1 @ 6728  Post-Procedural bleeding risk per IR MD assessment:  low  Antithrombotic medications on inpatient or PTA profile prior to procedure:     heparin drip  Recommended restart time per IR Post-Procedure Guidelines:   4 hours after    Plan:     At 2100 resume heparin drip at 2700 units/hr Heparin level/CBC in am  Dolly Rias RPh 03/13/2018, 5:45 PM Pager [phone]

## 2018-03-13 NOTE — Progress Notes (Signed)
PROGRESS NOTE    Mark YANKE  QKM:[id number] DOB: September 08, 1956 DOA: 03/10/2018 PCP: [name], MD   Brief Narrative: Mark Frey is a 62 y.o. male with a history of diabetes mellitus that presented secondary to DVT found on ultrasound. He was also found to have a massive bilateral PE in addition to concern for abdominal malignancy on CT scan. Started on heparin IV.   Assessment & Plan:   Active Problems:   Hyperlipidemia   Controlled diabetes mellitus type II without complication (HCC)   Hypertension   Bilateral pulmonary embolism (HCC)   Anemia   Hypoglycemia   Occult blood in stools   Intraabdominal mass   Bilateral pulmonary embolism Involving lobar, segmental and subsegmental branches bilaterally with evidence of heart strain. Not a tPA candidate per PCCM. Started on heparin drip. Transthoracic Echocardiogram not significant for heart strain -Continue Heparin drip until biopsy of abdominal mass obtained  DVT Right saphenofemoral junction, right common femoral vein, posterior tibial/peroneal veins. -Management above  Intraabdominal mass Large. Concern for malignancy. Oncology consulted. CT chest without evidence of metastatic disease -IR consult for biopsy today  Elevated blood pressure No history of hypertension. Currently still uncontrolled.  -hydralazine prn  Diabetes mellitus, type 2 Well controlled. Hemoglobin A1C of 5.4%. On Janumet and glimepiride as an outpatient. -SSI while inpatient  Heme positive stool Normocytic anemia No history of colonoscopy. Iron and iron sat low -Inpatient GI consult since hemoglobin continuing to drop especially in setting of anticoagulation -repeat CBC -transfuse for hemoglobin <7  Obesity Body mass index is 38.4 kg/m.   DVT prophylaxis: Heparin gtt Code Status:   Code Status: Full Code Family Communication: None at bedside; wife on telephone Disposition Plan: Discharge pending biopsy and GI  workup   Consultants:   PCCM  Oncology  IR  Gastroenterology  Procedures:   None  Antimicrobials:  None    Subjective: Feels well. States he has some scrotal swelling  Objective: Vitals:   03/12/18 1934 03/12/18 2004 03/13/18 0428 03/13/18 1243  BP:  (!) 145/75 137/80 (!) 161/81  Pulse:  (!) 103 99 (!) 102  Resp:  16 18 16   Temp: 98.4 F (36.9 C) 99.1 F (37.3 C) 98.8 F (37.1 C) 98 F (36.7 C)  TempSrc: Oral Oral Oral Oral  SpO2:  96% 95% 94%  Weight:      Height:        Intake/Output Summary (Last 24 hours) at 03/13/2018 1351 Last data filed at 03/13/2018 1008 Gross per 24 hour  Intake 314.1 ml  Output 925 ml  Net -610.9 ml   Filed Weights   03/10/18 1215  Weight: 117.9 kg (260 lb)    Examination:  General exam: Appears calm and comfortable Respiratory system: Clear to auscultation. Respiratory effort normal. Cardiovascular system: S1 & S2 heard, RRR. No murmurs. Gastrointestinal system: Abdomen is nondistended, obese, soft and nontender. RLQ mass. Normal bowel sounds heard. GU: scrotal swelling without tenderness Central nervous system: Alert and oriented. No focal neurological deficits Extremities: 2+ RLE and 1+ LLE edema. No calf tenderness Skin: No cyanosis. No rashes Psychiatry: Judgement and insight appear normal. Mood & affect appropriate.    Data Reviewed: I have personally reviewed following labs and imaging studies  CBC: Recent Labs  Lab 03/11/18 0733 03/11/18 1803 03/12/18 0037 03/12/18 0853 03/13/18 0526  WBC 7.6 8.1 7.9 9.0 8.5  HGB 8.2* 8.4* 8.1* 8.8* 7.7*  HCT 26.5* 27.4* 26.0* 28.5* 24.8*  MCV 86.3 86.4 86.4 86.4 85.5  PLT 476* 529* 498* 511* 917*   Basic Metabolic Panel: Recent Labs  Lab 03/10/18 1530 03/11/18 0152  NA 142 141  K 3.6 3.6  CL 108 109  CO2 23 22  GLUCOSE 53* 74  BUN 17 15  CREATININE 1.11 1.03  CALCIUM 9.2 8.9  MG  --  1.7  PHOS  --  3.1   GFR: Estimated Creatinine Clearance: 95.4 mL/min  (by C-G formula based on SCr of 1.03 mg/dL). Liver Function Tests: Recent Labs  Lab 03/10/18 1530 03/11/18 0152  AST 21 19  ALT 14 15  ALKPHOS 32* 29*  BILITOT 0.5 0.3  PROT 7.3 6.5  ALBUMIN 3.3* 2.8*   No results for input(s): LIPASE, AMYLASE in the last 168 hours. No results for input(s): AMMONIA in the last 168 hours. Coagulation Profile: Recent Labs  Lab 03/10/18 1847 03/13/18 0526  INR 1.50 1.13   Cardiac Enzymes: Recent Labs  Lab 03/10/18 1914 03/11/18 0152 03/11/18 0733  TROPONINI <0.03 <0.03 <0.03   BNP (last 3 results) No results for input(s): PROBNP in the last 8760 hours. HbA1C: Recent Labs    03/10/18 2151  HGBA1C 5.4   CBG: Recent Labs  Lab 03/12/18 2001 03/13/18 0000 03/13/18 0426 03/13/18 0739 03/13/18 1117  GLUCAP 100* 87 86 89 93   Lipid Profile: No results for input(s): CHOL, HDL, LDLCALC, TRIG, CHOLHDL, LDLDIRECT in the last 72 hours. Thyroid Function Tests: Recent Labs    03/11/18 0152  TSH 0.579   Anemia Panel: Recent Labs    03/11/18 0152  VITAMINB12 413  FOLATE 6.8  FERRITIN 90  TIBC 368  IRON 25*  RETICCTPCT 4.3*   Sepsis Labs: No results for input(s): PROCALCITON, LATICACIDVEN in the last 168 hours.  Recent Results (from the past 240 hour(s))  MRSA PCR Screening     Status: None   Collection Time: 03/10/18 10:05 PM  Result Value Ref Range Status   MRSA by PCR NEGATIVE NEGATIVE Final    Comment:        The GeneXpert MRSA Assay (FDA approved for NASAL specimens only), is one component of a comprehensive MRSA colonization surveillance program. It is not intended to diagnose MRSA infection nor to guide or monitor treatment for MRSA infections. Performed at [facility], [place] [address], [place], [place] [address]          Radiology Studies: No results found.      Scheduled Meds: . docusate sodium  100 mg Oral BID  . fenofibrate  160 mg Oral Daily  . insulin aspart  0-9 Units  Subcutaneous Q4H   Continuous Infusions:    LOS: 3 days     Cordelia Poche, MD [facility] 03/13/2018, 1:51 PM Pager: [phone]  If 7PM-7AM, please contact night-coverage [email or web address] 03/13/2018, 1:51 PM

## 2018-03-14 LAB — HEPARIN LEVEL (UNFRACTIONATED)
Heparin Unfractionated: 0.45 IU/mL (ref 0.30–0.70)
Heparin Unfractionated: 0.46 IU/mL (ref 0.30–0.70)

## 2018-03-14 LAB — GLUCOSE, CAPILLARY
GLUCOSE-CAPILLARY: 102 mg/dL — AB (ref 70–99)
GLUCOSE-CAPILLARY: 91 mg/dL (ref 70–99)
GLUCOSE-CAPILLARY: 76 mg/dL (ref 70–99)
GLUCOSE-CAPILLARY: 108 mg/dL — AB (ref 70–99)
Glucose-Capillary: 56 mg/dL — ABNORMAL LOW (ref 70–99)
Glucose-Capillary: 87 mg/dL (ref 70–99)
Glucose-Capillary: 95 mg/dL (ref 70–99)
Glucose-Capillary: 97 mg/dL (ref 70–99)

## 2018-03-14 LAB — CBC
HEMATOCRIT: 26.2 % — AB (ref 39.0–52.0)
Hemoglobin: 7.9 g/dL — ABNORMAL LOW (ref 13.0–17.0)
MCH: 26.1 pg (ref 26.0–34.0)
MCHC: 30.2 g/dL (ref 30.0–36.0)
MCV: 86.5 fL (ref 78.0–100.0)
Platelets: 442 10*3/uL — ABNORMAL HIGH (ref 150–400)
RBC: 3.03 MIL/uL — AB (ref 4.22–5.81)
RDW: 17 % — ABNORMAL HIGH (ref 11.5–15.5)
WBC: 8.5 10*3/uL (ref 4.0–10.5)

## 2018-03-14 MED ORDER — PEG-KCL-NACL-NASULF-NA ASC-C 100 G PO SOLR
1.0000 | Freq: Once | ORAL | Status: DC
Start: 2018-03-14 — End: 2018-03-14

## 2018-03-14 MED ORDER — PEG-KCL-NACL-NASULF-NA ASC-C 100 G PO SOLR
0.5000 | Freq: Once | ORAL | Status: AC
  Administered 2018-03-15: 100 g via ORAL

## 2018-03-14 MED ORDER — BISACODYL 5 MG PO TBEC
10.0000 mg | DELAYED_RELEASE_TABLET | Freq: Once | ORAL | Status: AC
  Administered 2018-03-14: 10 mg via ORAL
  Filled 2018-03-14: qty 2

## 2018-03-14 MED ORDER — PEG-KCL-NACL-NASULF-NA ASC-C 100 G PO SOLR
0.5000 | Freq: Once | ORAL | Status: AC
  Administered 2018-03-14: 100 g via ORAL
  Filled 2018-03-14: qty 1

## 2018-03-14 NOTE — Progress Notes (Signed)
[facility] Progress Note   Chief Complaint:   Anemia, heme + stool, abdominal mass    SUBJECTIVE:    feels fine today. No BM in a few days  ASSESSMENT AND PLAN:   1. 62 yo male with large infiltrating abdominal mass, s/p CT guided biopsy yesterday.   2. New normocytic anemia / heme + stools. Hgb 14 >>>7. Occasional scant blood with BM, no other overt GI bleeding.  -hgb stable at 7.9 today. Will plan on EGD / colonoscopy tomorrow. The risks and benefits of EGD as well as colonoscopy with possible polypectomy were discussed with patient and his wife and patient agrees to proceed.  -Heparin will need to be held at 7am for noon procedure (I left orders for this).  -Will give Dulcolax tabs now to help facilitate bowel purge  3. DVT / PE, on heparin gtt   OBJECTIVE:     Vital signs in last 24 hours: Temp:  [98 F (36.7 C)-99.1 F (37.3 C)] 99.1 F (37.3 C) (07/01 1928) Pulse Rate:  [88-118] 118 (07/01 1928) Resp:  [16-23] 18 (07/01 1928) BP: (134-180)/(81-109) 134/109 (07/01 1928) SpO2:  [94 %-97 %] 94 % (07/01 1928) Last BM Date: 03/12/18 General:   Alert, well-developed white male in NAD EENT:  Normal hearing, non icteric sclera, conjunctive pink.  Heart:  Regular rate and rhythm; no murmurs. 3+ RLE  edema Pulm: Normal respiratory effort, lungs CTA bilaterally without wheezes or crackles. Abdomen:  Soft,  nontender.  Normal bowel sounds, unchanged right abdominal mass Neurologic:  Alert and  oriented x4;  grossly normal neurologically. Psych:  Pleasant, cooperative.  Normal mood and affect.   Intake/Output from previous day: 07/01 0701 - 07/02 0700 In: 291.2 [I.V.:291.2] Out: 450 [Urine:450] Intake/Output this shift: No intake/output data recorded.  Lab Results: Recent Labs    03/12/18 0853 03/13/18 0526 03/14/18 0508  WBC 9.0 8.5 8.5  HGB 8.8* 7.7* 7.9*  HCT 28.5* 24.8* 26.2*  PLT 511* 505* 442*    PT/INR Recent Labs     03/13/18 0526  LABPROT 14.4  INR 1.13     Ct Biopsy  Result Date: 03/13/2018 CLINICAL DATA:  Large infiltrative masses of the peritoneal cavity. EXAM: CT GUIDED CORE BIOPSY OF PERITONEAL MASS ANESTHESIA/SEDATION: 2.0 mg IV Versed; 100 mcg IV Fentanyl Total Moderate Sedation Time:  17 minutes. The patient's level of consciousness and physiologic status were continuously monitored during the procedure by Radiology nursing. PROCEDURE: The procedure risks, benefits, and alternatives were explained to the patient. Questions regarding the procedure were encouraged and answered. The patient understands and consents to the procedure. A time-out was performed prior to initiating the procedure. The operative field was prepped with chlorhexidine in a sterile fashion, and a sterile drape was applied covering the operative field. A sterile gown and sterile gloves were used for the procedure. Local anesthesia was provided with 1% Lidocaine. Under CT guidance, a 17 gauge trocar needle was advanced into the right lower peritoneal cavity. Five separate coaxial 18 gauge core biopsy samples were obtained and submitted in both saline and formalin. Additional CT imaging was performed after outer needle removal. COMPLICATIONS: None FINDINGS: Right lower quadrant portion bulky peritoneal tumor was chosen for sampling. Solid tumor was obtained. There were no immediate complications. IMPRESSION: CT-guided core biopsy performed of bulky tumor in the right lower peritoneal cavity. Electronically Signed   By: Aletta Edouard M.D.   On: 03/13/2018 17:36    Active Problems:   Hyperlipidemia  Controlled diabetes mellitus type II without complication (HCC)   Hypertension   Bilateral pulmonary embolism (HCC)   Anemia   Hypoglycemia   Occult blood in stools   Intraabdominal mass   LOS: 4 days   Mark Frey ,NP 03/14/2018, 10:05 AM

## 2018-03-14 NOTE — Progress Notes (Signed)
ANTICOAGULATION CONSULT NOTE - Follow Up Consult  Pharmacy Consult for Heparin Indication: acute pulmonary embolus and DVT   Allergies  Allergen Reactions  . Ciprofloxacin Other (See Comments)    Leg tingling    Patient Measurements: Height: 5' 9"  (175.3 cm) Weight: 260 lb (117.9 kg) IBW/kg (Calculated) : 70.7 Heparin Dosing Weight:   Vital Signs: Temp: 99.1 F (37.3 C) (07/01 1928) Temp Source: Oral (07/01 1928) BP: 134/109 (07/01 1928) Pulse Rate: 118 (07/01 1928)  Labs: Recent Labs    03/11/18 0733  03/12/18 0853 03/12/18 1445 03/13/18 0526 03/14/18 0508  HGB 8.2*   < > 8.8*  --  7.7* 7.9*  HCT 26.5*   < > 28.5*  --  24.8* 26.2*  PLT 476*   < > 511*  --  505* 442*  APTT  --    < > 73* 66* 48*  --   LABPROT  --   --   --   --  14.4  --   INR  --   --   --   --  1.13  --   HEPARINUNFRC  --    < >  --  0.47 0.22* 0.45  TROPONINI <0.03  --   --   --   --   --    < > = values in this interval not displayed.    Estimated Creatinine Clearance: 95.4 mL/min (by C-G formula based on SCr of 1.03 mg/dL).   Medications:  Infusions:  . heparin 2,700 Units/hr (03/14/18 0300)    Assessment: Patient with heparin level at goal.  No heparin issues noted.  Goal of Therapy:  Heparin level 0.3-0.7 units/ml Monitor platelets by anticoagulation protocol: Yes   Plan:  Continue heparin drip at current rate Recheck level at [place], Hermann Crowford 03/14/2018,6:11 AM

## 2018-03-14 NOTE — Progress Notes (Signed)
ANTICOAGULATION CONSULT NOTE - Follow Up Consult  Pharmacy Consult for Heparin Indication: acute PE and DVT  Allergies  Allergen Reactions  . Ciprofloxacin Other (See Comments)    Leg tingling    Patient Measurements: Height: 5' 9"  (175.3 cm) Weight: 260 lb (117.9 kg) IBW/kg (Calculated) : 70.7 Heparin Dosing Weight: 97 kg  Vital Signs:    Labs: Recent Labs    03/12/18 0853 03/12/18 1445 03/13/18 0526 03/14/18 0508  HGB 8.8*  --  7.7* 7.9*  HCT 28.5*  --  24.8* 26.2*  PLT 511*  --  505* 442*  APTT 73* 66* 48*  --   LABPROT  --   --  14.4  --   INR  --   --  1.13  --   HEPARINUNFRC  --  0.47 0.22* 0.45    Estimated Creatinine Clearance: 95.4 mL/min (by C-G formula based on SCr of 1.03 mg/dL).   Medications:  Infusions:  . heparin 2,700 Units/hr (03/14/18 1138)    Assessment: 65 yoM went to his PCP office on 6/27 with c/o right leg edema and was sent home on xarelto 15 mg bid pending LE doppler.  Outpatient LE doppler showed acute right DVT involving the right saphenofemoral junction, right common femoral vein, posterior tibial and peroneal veins. He was sent to the ED on 6/28 for further workup and management of thromboembolism. Chest CTA on 6/28 showed acute bilateral PE with evidence of right hear strain and abd CT showed abdominal mass. Oncology consulted for intra-abdominal tumor. Heparin started on admission for acute DVT/PE.  Note hemoccult positive on admission.  Significant events: 7/1  Heparin off at 0800 (6 hrs) prior to IR biopsy.  Resumed 4 hrs post procedure at 2100 7/3 Hold heparin 4-5 hrs before colonoscopy.   Today, 03/14/2018:  Heparin level: 0.46, remains therapeutic on Heparin at 2700 units/hr  CBC:  Hgb remains low/stable at 7.9, Plt elevated at 442  No transfusions this admission (transfuse if Hgb < 7)  Bleeding:  GI reports Occasional scant blood with BM, no other overt GI bleeding.   Goal of Therapy:  Heparin level 0.3-0.7 units/ml -  aim for lower heparin level 0.3-0.5 d/t risk for GIB with abd mass. Monitor platelets by anticoagulation protocol: Yes   Plan:   Continue heparin IV infusion at 2700 units/hr  Daily heparin level and CBC  HOLD heparin on 7/3 at 07:00 prior to EGD /colonoscopy to be done at noon.   Follow up long-term anticoagulation plans.   Gretta Arab PharmD, BCPS Pager [phone] 03/14/2018 12:16 PM

## 2018-03-14 NOTE — Progress Notes (Signed)
PROGRESS NOTE    Mark Frey  RWE:[id number] DOB: 1955-09-22 DOA: 03/10/2018 PCP: [name], MD   Brief Narrative: Mark Frey is a 62 y.o. male with a history of diabetes mellitus that presented secondary to DVT found on ultrasound. He was also found to have a massive bilateral PE in addition to concern for abdominal malignancy on CT scan. Started on heparin IV.   Assessment & Plan:   Active Problems:   Hyperlipidemia   Controlled diabetes mellitus type II without complication (HCC)   Hypertension   Bilateral pulmonary embolism (HCC)   Anemia   Hypoglycemia   Occult blood in stools   Intraabdominal mass   Bilateral pulmonary embolism Involving lobar, segmental and subsegmental branches bilaterally with evidence of heart strain. Not a tPA candidate per PCCM. Started on heparin drip. Transthoracic Echocardiogram not significant for heart strain -Continue Heparin drip until biopsy of abdominal mass obtained  DVT Right saphenofemoral junction, right common femoral vein, posterior tibial/peroneal veins. -Management above  Intraabdominal mass Large. Concern for malignancy. Oncology consulted. CT chest without evidence of metastatic disease. S/p biopsy.  Elevated blood pressure No history of hypertension. Currently still uncontrolled.  -hydralazine prn  Diabetes mellitus, type 2 Well controlled. Hemoglobin A1C of 5.4%. On Janumet and glimepiride as an outpatient. -SSI while inpatient  Heme positive stool Normocytic anemia No history of colonoscopy. Iron and iron sat low -GI recommendations: Colonoscopy -repeat CBC -transfuse for hemoglobin <7  Obesity Body mass index is 38.4 kg/m.   DVT prophylaxis: Heparin gtt Code Status:   Code Status: Full Code Family Communication: None at bedside; wife on telephone Disposition Plan: Discharge pending biopsy and GI workup   Consultants:   PCCM  Oncology  IR  Gastroenterology  Procedures:    None  Antimicrobials:  None    Subjective: No issues overnight. No blood stools.  Objective: Vitals:   03/13/18 1656 03/13/18 1718 03/13/18 1928 03/14/18 1351  BP: (!) 158/92 (!) 180/84 (!) 134/109 (!) 160/85  Pulse: 88 (!) 106 (!) 118 (!) 107  Resp: (!) _0 Temp:  98.8 F (37.1 C) 99.1 F (37.3 C) 98.3 F (36.8 C)  TempSrc:  Oral Oral Oral  SpO2: 97% 96% 94% 97%  Weight:      Height:        Intake/Output Summary (Last 24 hours) at 03/14/2018 1508 Last data filed at 03/14/2018 1353 Gross per 24 hour  Intake 255.6 ml  Output 525 ml  Net -269.4 ml   Filed Weights   03/10/18 1215  Weight: 117.9 kg (260 lb)    Examination:  General: Well appearing, no distress Respiratory: Clear to auscultation bilaterally. Unlabored work of breathing. No wheezing or rales. Regular rate and rhythm. Normal S1 and S2. No heart murmurs present. No extra heart sounds Soft, non-tender, non-distended, no guarding, no rebound, RLQ mass felt GU: scrotal swelling without tenderness Central nervous system: Alert, oriented. No focal deficit Extremities: 2+ RLE and 1+ LLE edema. No calf tenderness Skin: No cyanosis. No rashes Psychiatry: Judgement and insight appear normal. Mood & affect appropriate.    Data Reviewed: I have personally reviewed following labs and imaging studies  CBC: Recent Labs  Lab 03/11/18 1803 03/12/18 0037 03/12/18 0853 03/13/18 0526 03/14/18 0508  WBC 8.1 7.9 9.0 8.5 8.5  HGB 8.4* 8.1* 8.8* 7.7* 7.9*  HCT 27.4* 26.0* 28.5* 24.8* 26.2*  MCV 86.4 86.4 86.4 85.5 86.5  PLT 529* 498* 511* 505* 442*   Basic  Metabolic Panel: Recent Labs  Lab 03/10/18 1530 03/11/18 0152  NA 142 141  K 3.6 3.6  CL 108 109  CO2 23 22  GLUCOSE 53* 74  BUN 17 15  CREATININE 1.11 1.03  CALCIUM 9.2 8.9  MG  --  1.7  PHOS  --  3.1   GFR: Estimated Creatinine Clearance: 95.4 mL/min (by C-G formula based on SCr of 1.03 mg/dL). Liver Function Tests: Recent Labs   Lab 03/10/18 1530 03/11/18 0152  AST 21 19  ALT 14 15  ALKPHOS 32* 29*  BILITOT 0.5 0.3  PROT 7.3 6.5  ALBUMIN 3.3* 2.8*   No results for input(s): LIPASE, AMYLASE in the last 168 hours. No results for input(s): AMMONIA in the last 168 hours. Coagulation Profile: Recent Labs  Lab 03/10/18 1847 2018-04-12 0526  INR 1.50 1.13   Cardiac Enzymes: Recent Labs  Lab 03/10/18 1914 03/11/18 0152 03/11/18 0733  TROPONINI <0.03 <0.03 <0.03   BNP (last 3 results) No results for input(s): PROBNP in the last 8760 hours. HbA1C: No results for input(s): HGBA1C in the last 72 hours. CBG: Recent Labs  Lab 04/12/2018 2011 03/14/18 0016 03/14/18 0454 03/14/18 0713 03/14/18 1136  GLUCAP 142* 87 97 95 91   Lipid Profile: No results for input(s): CHOL, HDL, LDLCALC, TRIG, CHOLHDL, LDLDIRECT in the last 72 hours. Thyroid Function Tests: No results for input(s): TSH, T4TOTAL, FREET4, T3FREE, THYROIDAB in the last 72 hours. Anemia Panel: No results for input(s): VITAMINB12, FOLATE, FERRITIN, TIBC, IRON, RETICCTPCT in the last 72 hours. Sepsis Labs: No results for input(s): PROCALCITON, LATICACIDVEN in the last 168 hours.  Recent Results (from the past 240 hour(s))  MRSA PCR Screening     Status: None   Collection Time: 03/10/18 10:05 PM  Result Value Ref Range Status   MRSA by PCR NEGATIVE NEGATIVE Final    Comment:        The GeneXpert MRSA Assay (FDA approved for NASAL specimens only), is one component of a comprehensive MRSA colonization surveillance program. It is not intended to diagnose MRSA infection nor to guide or monitor treatment for MRSA infections. Performed at [facility], [place] [address] [place], [place] [address]          Radiology Studies: Ct Biopsy  Result Date: 04/12/18 CLINICAL DATA:  Large infiltrative masses of the peritoneal cavity. EXAM: CT GUIDED CORE BIOPSY OF PERITONEAL MASS ANESTHESIA/SEDATION: 2.0 mg IV Versed; 100 mcg  IV Fentanyl Total Moderate Sedation Time:  17 minutes. The patient's level of consciousness and physiologic status were continuously monitored during the procedure by Radiology nursing. PROCEDURE: The procedure risks, benefits, and alternatives were explained to the patient. Questions regarding the procedure were encouraged and answered. The patient understands and consents to the procedure. A time-out was performed prior to initiating the procedure. The operative field was prepped with chlorhexidine in a sterile fashion, and a sterile drape was applied covering the operative field. A sterile gown and sterile gloves were used for the procedure. Local anesthesia was provided with 1% Lidocaine. Under CT guidance, a 17 gauge trocar needle was advanced into the right lower peritoneal cavity. Five separate coaxial 18 gauge core biopsy samples were obtained and submitted in both saline and formalin. Additional CT imaging was performed after outer needle removal. COMPLICATIONS: None FINDINGS: Right lower quadrant portion bulky peritoneal tumor was chosen for sampling. Solid tumor was obtained. There were no immediate complications. IMPRESSION: CT-guided core biopsy performed of bulky tumor in the right lower  peritoneal cavity. Electronically Signed   By: Aletta Edouard M.D.   On: 03/13/2018 17:36        Scheduled Meds: . docusate sodium  100 mg Oral BID  . fenofibrate  160 mg Oral Daily  . insulin aspart  0-9 Units Subcutaneous Q4H  . peg 3350 powder  0.5 kit Oral Once   And  . [START ON 03/15/2018] peg 3350 powder  0.5 kit Oral Once   Continuous Infusions: . heparin 2,700 Units/hr (03/14/18 1138)     LOS: 4 days     Cordelia Poche, MD [facility] 03/14/2018, 3:08 PM Pager: [phone]  If 7PM-7AM, please contact night-coverage [email or web address] 03/14/2018, 3:08 PM

## 2018-03-15 ENCOUNTER — Encounter (HOSPITAL_COMMUNITY): Payer: Self-pay | Admitting: *Deleted

## 2018-03-15 ENCOUNTER — Inpatient Hospital Stay (HOSPITAL_COMMUNITY): Payer: BLUE CROSS/BLUE SHIELD

## 2018-03-15 ENCOUNTER — Encounter (HOSPITAL_COMMUNITY)
Admission: EM | Disposition: A | Discharge status: Home / Self Care | Payer: Self-pay | Source: Home / Self Care | Attending: Internal Medicine

## 2018-03-15 ENCOUNTER — Inpatient Hospital Stay (HOSPITAL_COMMUNITY): Payer: BLUE CROSS/BLUE SHIELD | Admitting: Registered Nurse

## 2018-03-15 DIAGNOSIS — E669 Obesity, unspecified: Secondary | ICD-10-CM

## 2018-03-15 DIAGNOSIS — C8513 Unspecified B-cell lymphoma, intra-abdominal lymph nodes: Secondary | ICD-10-CM

## 2018-03-15 DIAGNOSIS — Z8572 Personal history of non-Hodgkin lymphomas: Secondary | ICD-10-CM

## 2018-03-15 DIAGNOSIS — Z6839 Body mass index (BMI) 39.0-39.9, adult: Secondary | ICD-10-CM

## 2018-03-15 DIAGNOSIS — C8510 Unspecified B-cell lymphoma, unspecified site: Secondary | ICD-10-CM

## 2018-03-15 HISTORY — PX: COLONOSCOPY: SHX5424

## 2018-03-15 HISTORY — PX: ESOPHAGOGASTRODUODENOSCOPY: SHX5428

## 2018-03-15 HISTORY — PX: BIOPSY: SHX5522

## 2018-03-15 LAB — DIRECT ANTIGLOBULIN TEST (NOT AT ARMC)
DAT, COMPLEMENT: NEGATIVE
DAT, IGG: NEGATIVE

## 2018-03-15 LAB — CBC
HEMATOCRIT: 25.1 % — AB (ref 39.0–52.0)
Hemoglobin: 7.5 g/dL — ABNORMAL LOW (ref 13.0–17.0)
MCH: 26 pg (ref 26.0–34.0)
MCHC: 29.9 g/dL — AB (ref 30.0–36.0)
MCV: 87.2 fL (ref 78.0–100.0)
PLATELETS: 475 10*3/uL — AB (ref 150–400)
RBC: 2.88 MIL/uL — ABNORMAL LOW (ref 4.22–5.81)
RDW: 17 % — AB (ref 11.5–15.5)
WBC: 7.8 10*3/uL (ref 4.0–10.5)

## 2018-03-15 LAB — GLUCOSE, CAPILLARY
GLUCOSE-CAPILLARY: 120 mg/dL — AB (ref 70–99)
GLUCOSE-CAPILLARY: 118 mg/dL — AB (ref 70–99)
Glucose-Capillary: 108 mg/dL — ABNORMAL HIGH (ref 70–99)
Glucose-Capillary: 127 mg/dL — ABNORMAL HIGH (ref 70–99)
Glucose-Capillary: 163 mg/dL — ABNORMAL HIGH (ref 70–99)
Glucose-Capillary: 169 mg/dL — ABNORMAL HIGH (ref 70–99)

## 2018-03-15 LAB — PHOSPHORUS: Phosphorus: 1.9 mg/dL — ABNORMAL LOW (ref 2.5–4.6)

## 2018-03-15 LAB — RETICULOCYTES
RBC.: 2.86 MIL/uL — AB (ref 4.22–5.81)
RETIC COUNT ABSOLUTE: 125.8 10*3/uL (ref 19.0–186.0)
RETIC CT PCT: 4.4 % — AB (ref 0.4–3.1)

## 2018-03-15 LAB — HEPARIN LEVEL (UNFRACTIONATED)
Heparin Unfractionated: 0.6 IU/mL (ref 0.30–0.70)
Heparin Unfractionated: 0.29 IU/mL — ABNORMAL LOW (ref 0.30–0.70)

## 2018-03-15 LAB — LACTATE DEHYDROGENASE: LDH: 383 U/L — ABNORMAL HIGH (ref 98–192)

## 2018-03-15 LAB — MAGNESIUM: Magnesium: 2 mg/dL (ref 1.7–2.4)

## 2018-03-15 LAB — URIC ACID: Uric Acid, Serum: 7.7 mg/dL (ref 3.7–8.6)

## 2018-03-15 SURGERY — COLONOSCOPY
Anesthesia: Monitor Anesthesia Care

## 2018-03-15 MED ORDER — LIDOCAINE 2% (20 MG/ML) 5 ML SYRINGE
INTRAMUSCULAR | Status: DC | PRN
  Administered 2018-03-15: 80 mg via INTRAVENOUS

## 2018-03-15 MED ORDER — PROPOFOL 10 MG/ML IV BOLUS
INTRAVENOUS | Status: AC
  Filled 2018-03-15: qty 20

## 2018-03-15 MED ORDER — HEPARIN (PORCINE) IN NACL 100-0.45 UNIT/ML-% IJ SOLN
2600.0000 [IU]/h | INTRAMUSCULAR | Status: DC
  Administered 2018-03-15: 2650 [IU]/h via INTRAVENOUS
  Administered 2018-03-15 – 2018-03-16 (×3): 2600 [IU]/h via INTRAVENOUS
  Filled 2018-03-15 (×5): qty 250

## 2018-03-15 MED ORDER — LACTATED RINGERS IV SOLN
INTRAVENOUS | Status: DC
  Administered 2018-03-15: 12:00:00 via INTRAVENOUS

## 2018-03-15 MED ORDER — ONDANSETRON HCL 4 MG/2ML IJ SOLN
INTRAMUSCULAR | Status: DC | PRN
  Administered 2018-03-15: 4 mg via INTRAVENOUS

## 2018-03-15 MED ORDER — PROPOFOL 10 MG/ML IV BOLUS
INTRAVENOUS | Status: DC | PRN
  Administered 2018-03-15: 20 mg via INTRAVENOUS

## 2018-03-15 MED ORDER — PROPOFOL 500 MG/50ML IV EMUL
INTRAVENOUS | Status: DC | PRN
Start: 2018-03-15 — End: 2018-03-15
  Administered 2018-03-15: 150 ug/kg/min via INTRAVENOUS

## 2018-03-15 MED ORDER — PROPOFOL 10 MG/ML IV BOLUS
INTRAVENOUS | Status: AC
  Filled 2018-03-15: qty 40

## 2018-03-15 NOTE — Interval H&P Note (Signed)
History and Physical Interval Note:  03/15/2018 12:35 PM  Mark Frey  has presented today for surgery, with the diagnosis of anemia, heme positive stool, occasional rectal bleeding  The various methods of treatment have been discussed with the patient and family. After consideration of risks, benefits and other options for treatment, the patient has consented to  Procedure(s): COLONOSCOPY (N/A) ESOPHAGOGASTRODUODENOSCOPY (EGD) (N/A) as a surgical intervention .  The patient's history has been reviewed, patient examined, no change in status, stable for surgery.  I have reviewed the patient's chart and labs.  Questions were answered to the patient's satisfaction.     Milus Banister

## 2018-03-15 NOTE — Progress Notes (Addendum)
Mark Frey   DOB:[birth year]   ZL#:[id number]   TJQ#:[id number]  Oncology f/u note   Subjective: Patient was seen by my partner Dr. Lindi Adie a few days ago. His abdominal mass biopsy preliminary report came bac as grade B-cell lymphoma.  I stop by to see patient and discussed his biopsy results.  He underwent EGD and colonoscopy, which showed a large frail mass in the ascending colon, biopsy was also done.  He remains to be on heparin drip, his right leg edema and the pain has improved, no significant dyspnea or other symptoms.  He denies recent fever, night sweats, or weight loss.   Objective:  Vitals:   03/15/18 1313 03/15/18 1320  BP: 126/73 (!) 157/92  Pulse: 98 96  Resp: (!) 22 (!) 26  Temp: (!) 97.5 F (36.4 C)   SpO2: 100% 98%    Body mass index is 38.4 kg/m.  Intake/Output Summary (Last 24 hours) at 03/15/2018 1815 Last data filed at 03/15/2018 1500 Gross per 24 hour  Intake 958 ml  Output 400 ml  Net 558 ml     Sclerae unicteric  Oropharynx clear  No peripheral adenopathy  Lungs clear -- no rales or rhonchi  Heart regular rate and rhythm  Abdomen soft, mild tenderness at right side abdomen, there is a large palpable abdominal mass in the most of the right side abdomen.   MSK no focal spinal tenderness, no peripheral edema  Neuro nonfocal    CBG (last 3)  Recent Labs    03/15/18 0753 03/15/18 1144 03/15/18 1604  GLUCAP 163* 118* 127*     Labs:  Lab Results  Component Value Date   WBC 7.8 03/15/2018   HGB 7.5 (L) 03/15/2018   HCT 25.1 (L) 03/15/2018   MCV 87.2 03/15/2018   PLT 475 (H) 03/15/2018   NEUTROABS 2.8 10/21/2015    CMP Latest Ref Rng & Units 03/11/2018 03/10/2018 08/29/2017  Glucose 70 - 99 mg/dL 74 53(L) 171(H)  BUN 8 - 23 mg/dL _0 Creatinine 0.61 - 1.24 mg/dL 1.03 1.11 1.08  Sodium 135 - 145 mmol/L 141 142 138  Potassium 3.5 - 5.1 mmol/L 3.6 3.6 4.5  Chloride 98 - 111 mmol/L 109 108 102  CO2 22 - 32 mmol/L _1 Calcium 8.9 -  10.3 mg/dL 8.9 9.2 9.6  Total Protein 6.5 - 8.1 g/dL 6.5 7.3 7.4  Total Bilirubin 0.3 - 1.2 mg/dL 0.3 0.5 0.5  Alkaline Phos 38 - 126 U/L 29(L) 32(L) 32(L)  AST 15 - 41 U/L _2 ALT 0 - 44 U/L _3 Urine Studies No results for input(s): UHGB, CRYS in the last 72 hours.  Invalid input(s): UACOL, UAPR, USPG, UPH, UTP, UGL, UKET, UBIL, UNIT, UROB, ULEU, UEPI, UWBC, URBC, UBAC, CAST, UCOM, BILUA  Basic Metabolic Panel: Recent Labs  Lab 03/10/18 1530 03/11/18 0152  NA 142 141  K 3.6 3.6  CL 108 109  CO2 23 22  GLUCOSE 53* 74  BUN 17 15  CREATININE 1.11 1.03  CALCIUM 9.2 8.9  MG  --  1.7  PHOS  --  3.1   GFR Estimated Creatinine Clearance: 95.4 mL/min (by C-G formula based on SCr of 1.03 mg/dL). Liver Function Tests: Recent Labs  Lab 03/10/18 1530 03/11/18 0152  AST 21 19  ALT 14 15  ALKPHOS 32* 29*  BILITOT 0.5 0.3  PROT 7.3 6.5  ALBUMIN 3.3* 2.8*   No  results for input(s): LIPASE, AMYLASE in the last 168 hours. No results for input(s): AMMONIA in the last 168 hours. Coagulation profile Recent Labs  Lab 03/10/18 1847 03/13/18 0526  INR 1.50 1.13    CBC: Recent Labs  Lab 03/12/18 0037 03/12/18 0853 03/13/18 0526 03/14/18 0508 03/15/18 0457  WBC 7.9 9.0 8.5 8.5 7.8  HGB 8.1* 8.8* 7.7* 7.9* 7.5*  HCT 26.0* 28.5* 24.8* 26.2* 25.1*  MCV 86.4 86.4 85.5 86.5 87.2  PLT 498* 511* 505* 442* 475*   Cardiac Enzymes: Recent Labs  Lab 03/10/18 1914 03/11/18 0152 03/11/18 0733  TROPONINI <0.03 <0.03 <0.03   BNP: Invalid input(s): POCBNP CBG: Recent Labs  Lab 03/15/18 0015 03/15/18 0538 03/15/18 0753 03/15/18 1144 03/15/18 1604  GLUCAP 108* 120* 163* 118* 127*   D-Dimer No results for input(s): DDIMER in the last 72 hours. Hgb A1c No results for input(s): HGBA1C in the last 72 hours. Lipid Profile No results for input(s): CHOL, HDL, LDLCALC, TRIG, CHOLHDL, LDLDIRECT in the last 72 hours. Thyroid function studies No results for  input(s): TSH, T4TOTAL, T3FREE, THYROIDAB in the last 72 hours.  Invalid input(s): FREET3 Anemia work up No results for input(s): VITAMINB12, FOLATE, FERRITIN, TIBC, IRON, RETICCTPCT in the last 72 hours. Microbiology Recent Results (from the past 240 hour(s))  MRSA PCR Screening     Status: None   Collection Time: 03/10/18 10:05 PM  Result Value Ref Range Status   MRSA by PCR NEGATIVE NEGATIVE Final    Comment:        The GeneXpert MRSA Assay (FDA approved for NASAL specimens only), is one component of a comprehensive MRSA colonization surveillance program. It is not intended to diagnose MRSA infection nor to guide or monitor treatment for MRSA infections. Performed at [facility], [place] [address] [place], [place] [address]       Studies:  No results found.  Assessment: 62 y.o. male with past medical history of diabetes and hyperlipidemia, presented with acute right lower extremity DVT, and further work-up found submassive PE and a large abdominal mass.  1. 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 Right ascending colon mass, likely due to abdominal lymphoma direct invasion 3.  Submassive PE and right lower extremity DVT, on heparin drip 4.  Microcytic anemia, likely secondary to GI bleeding and iron deficiency, rule out bone marrow involvement from lymphoma 5. Type 2 diabetes  6. Obesity  7. Anemia, iron deficient anemia, rule out lymphoma marrow involvement    Plan:  -I spoke with pathologist Dr. Gari Crown today, the morphology of the abdominal mass biopsy are most consistent with high-grade B-cell lymphoma, immunostain results are still pending, he will likely sign out on Friday.  -I discussed the preliminary path  results with pt and his wife.  -I would like to have a bone marrow biopsy for staging, ideally done before we start chemo, pt is obese and strongly prefers sedation for procedure. I will  contact IR to see if they can do it on Friday -would need a port placement also  -I discussed the overall prognosis and treatment options for high-grade B-cell lymphoma.  I recommend chemotherapy R-EPOCH for 6 cycles with the intention to cure his lymphoma, we discussed the success rate is about 50%. If he has residual disease after chemotherapy, he may need consolidation radiation. -He had ECHO on 03/11/18 and EF normal  -I have discussed with surgical team Dr. Marlou Starks, who does not  recommend surgical intervention.  -I plan to start chemo this Friday or Saturday  -will check TLS lab, if there is signs of TLS, will start him on prednisone tomorrow  -he does have low iron level, likely secondary to GI bleeding from the tumor, please consider one dose iv Feraheme 567m tomorrow. If his Hb remains to be in low 7's, will consider blood transfusion  -continue heparin drip for now  -I will f/u closely.   I spent a total of 60 mins for his visit today, including coordinate his care, and >50% on face-to-face counseling   YTruitt Merle MD 03/15/2018  6:15 PM

## 2018-03-15 NOTE — Progress Notes (Addendum)
ANTICOAGULATION CONSULT NOTE - Follow Up Consult  Pharmacy Consult for Heparin Indication: acute PE and DVT  Allergies  Allergen Reactions  . Ciprofloxacin Other (See Comments)    Leg tingling    Patient Measurements: Height: 5' 9"  (175.3 cm) Weight: 260 lb (117.9 kg) IBW/kg (Calculated) : 70.7 Heparin Dosing Weight: 97 kg  Vital Signs: Temp: 97.5 F (36.4 C) (07/03 1313) Temp Source: Oral (07/03 1313) BP: 157/92 (07/03 1320) Pulse Rate: 96 (07/03 1320)  Labs: Recent Labs    03/12/18 1445  03/13/18 0526 03/14/18 0508 03/14/18 1210 03/15/18 0457  HGB  --    < > 7.7* 7.9*  --  7.5*  HCT  --   --  24.8* 26.2*  --  25.1*  PLT  --   --  505* 442*  --  475*  APTT 66*  --  48*  --   --   --   LABPROT  --   --  14.4  --   --   --   INR  --   --  1.13  --   --   --   HEPARINUNFRC 0.47  --  0.22* 0.45 0.46 0.60   < > = values in this interval not displayed.    Estimated Creatinine Clearance: 95.4 mL/min (by C-G formula based on SCr of 1.03 mg/dL).   Medications:  Infusions:    Assessment: 89 yoM went to his PCP office on 6/27 with c/o right leg edema and was sent home on xarelto 15 mg bid pending LE doppler.  Outpatient LE doppler showed acute right DVT involving the right saphenofemoral junction, right common femoral vein, posterior tibial and peroneal veins. He was sent to the ED on 6/28 for further workup and management of thromboembolism. Chest CTA on 6/28 showed acute bilateral PE with evidence of right hear strain and abd CT showed abdominal mass. Oncology consulted for intra-abdominal tumor. Heparin started on admission for acute DVT/PE.  Note hemoccult positive on admission.  Significant events: 7/1 - heparin off at 0800 (6 hrs) prior to IR biopsy. Resumed 4 hrs post procedure at 2100 7/3 - heparin held at 0700 prior to colonoscopy. 7/3 - Colonoscopy revealed large, friable, ulcerated mass of the right colon.  GI recommended be careful with anticoagulation,  recommended heparin for now pending decision re: plan of care.  Dr. Ardis Hughs ok with resuming heparin at this time but will shoot for lower end therapeutic goal   Today, 03/15/2018:  Heparin level this morning was 0.6 (above lower end of goal) on heparin at 2700 units/hr  CBC: Hgb continues to slowly drift down, but still currently > 7; no transfusions ordered, Plt elevated at 442  No transfusions this admission (transfuse if Hgb < 7)  Bleeding: GI reports Occasional scant blood with BM, no other overt GI bleeding.   Goal of Therapy:  Heparin level 0.3-0.5 d/t large, friable colon mass seen on colonoscopy 7/3 Monitor platelets by anticoagulation protocol: Yes   Plan:   Based on colonoscopy findings, resume heparin at 2600 units/hr  Check 6h heparin level  Daily heparin level and CBC  Follow up plan of care for mass and concern for ongoing bleeding from mass   Doreene Eland, PharmD, BCPS.   Pager: [phone] 03/15/2018 1:52 PM

## 2018-03-15 NOTE — Op Note (Addendum)
[facility] Patient Name: Mark Frey Procedure Date: 03/15/2018 MRN: [id number] Attending MD: Milus Banister , MD Date of Birth: 16-Mar-1956 CSN: [id number] Age: 62 Admit Type: Inpatient Procedure:                Colonoscopy Indications:              Abnormal CT of the GI tract; large mass in right                            abdomen, percutaneous biopsy yesterday suggests                            lymphoma Providers:                Milus Banister, MD, Elmer Ramp. Tilden Dome, RN, Cherylynn Ridges, Technician Referring MD:              Medicines:                Monitored Anesthesia Care Complications:            No immediate complications. Estimated blood loss:                            None. Estimated Blood Loss:     Estimated blood loss: none. Procedure:                Pre-Anesthesia Assessment:                           - Prior to the procedure, a History and Physical                            was performed, and patient medications and                            allergies were reviewed. The patient's tolerance of                            previous anesthesia was also reviewed. The risks                            and benefits of the procedure and the sedation                            options and risks were discussed with the patient.                            All questions were answered, and informed consent                            was obtained. Prior Anticoagulants: The patient has                            taken heparin, last dose  was day of procedure. ASA                            Grade Assessment: IV - A patient with severe                            systemic disease that is a constant threat to life.                            After reviewing the risks and benefits, the patient                            was deemed in satisfactory condition to undergo the                            procedure.                           After  obtaining informed consent, the colonoscope                            was passed under direct vision. Throughout the                            procedure, the patient's blood pressure, pulse, and                            oxygen saturations were monitored continuously. The                            EC-[id number] ([id number]) scope was introduced through                            the anus with the intention of advancing to the                            cecum. The scope was advanced to the hepatic                            flexure before the procedure was aborted.                            Medications were given. The colonoscopy was                            performed without difficulty. The patient tolerated                            the procedure well. The quality of the bowel                            preparation was good. The rectum was photographed. Scope In: 20:94:70 PM Scope Out: 12:56:55 PM Total Procedure Duration: 0 hours 10 minutes 1 second  Findings:  There was a large, friable, ulcerated mass that was involving the right       colon. The mass is causing signficant anatomic distortion of the colon,       I was unable to advance the scope more proximally. Biopsies taken and       sent to path (in formalin and also saline).      The exam was otherwise without abnormality on direct and retroflexion       views. Impression:               - The large right abdomen mass clearly involves his                            proximal colon. It causes significant anatomic                            distortion, I was unable to advance the colonoscope                            more proximally. Biopsies taken. Moderate Sedation:      N/A- Per Anesthesia Care Recommendation:           - EGD now.                           - Need to be very careful with blood thinners, the                            mass is quite friable, oozy and certainly                            contributing to his  anemia. I recommend only using                            IV heparin for now until a multimodality treatment                            plan is set. Procedure Code(s):        --- Professional ---                           [id number], 40, Colonoscopy, flexible; with biopsy,                            single or multiple Diagnosis Code(s):        --- Professional ---                           D49.0, Neoplasm of unspecified behavior of                            digestive system                           K56.690, Other partial intestinal obstruction  R93.3, Abnormal findings on diagnostic imaging of                            other parts of digestive tract CPT copyright 2017 [employer]. All rights reserved. The codes documented in this report are preliminary and upon coder review may  be revised to meet current compliance requirements. Milus Banister, MD 03/15/2018 1:08:18 PM This report has been signed electronically. Number of Addenda: 0

## 2018-03-15 NOTE — Progress Notes (Signed)
ANTICOAGULATION CONSULT NOTE - Follow Up Consult  Pharmacy Consult for Heparin Indication: acute PE and DVT  Allergies  Allergen Reactions  . Ciprofloxacin Other (See Comments)    Leg tingling    Patient Measurements: Height: 5' 9"  (175.3 cm) Weight: 260 lb (117.9 kg) IBW/kg (Calculated) : 70.7 Heparin Dosing Weight: 97 kg  Vital Signs: Temp: 97.4 F (36.3 C) (07/03 2028) Temp Source: Oral (07/03 2028) BP: 145/67 (07/03 2028) Pulse Rate: 114 (07/03 2028)  Labs: Recent Labs    03/13/18 0526 03/14/18 0508 03/14/18 1210 03/15/18 0457 03/15/18 2020  HGB 7.7* 7.9*  --  7.5*  --   HCT 24.8* 26.2*  --  25.1*  --   PLT 505* 442*  --  475*  --   APTT 48*  --   --   --   --   LABPROT 14.4  --   --   --   --   INR 1.13  --   --   --   --   HEPARINUNFRC 0.22* 0.45 0.46 0.60 0.29*    Estimated Creatinine Clearance: 95.4 mL/min (by C-G formula based on SCr of 1.03 mg/dL).   Medications:  Infusions:  . heparin 2,600 Units/hr (03/15/18 1400)    Assessment: 31 yoM went to his PCP office on 6/27 with c/o right leg edema and was sent home on xarelto 15 mg bid pending LE doppler.  Outpatient LE doppler showed acute right DVT involving the right saphenofemoral junction, right common femoral vein, posterior tibial and peroneal veins. He was sent to the ED on 6/28 for further workup and management of thromboembolism. Chest CTA on 6/28 showed acute bilateral PE with evidence of right hear strain and abd CT showed abdominal mass. Oncology consulted for intra-abdominal tumor. Heparin started on admission for acute DVT/PE.  Note hemoccult positive on admission.  Significant events: 7/1 - heparin off at 0800 (6 hrs) prior to IR biopsy. Resumed 4 hrs post procedure at 2100 7/3 - heparin held at 0700 prior to colonoscopy. 7/3 - Colonoscopy revealed large, friable, ulcerated mass of the right colon.  GI recommended be careful with anticoagulation, recommended heparin for now pending decision  re: plan of care.  Dr. Ardis Hughs ok with resuming heparin at this time but will shoot for lower end therapeutic goal   Today, 03/15/2018:  Heparin level this morning was 0.6 (above lower end of goal) on heparin at 2700 units/hr  CBC: Hgb continues to slowly drift down, but still currently > 7; no transfusions ordered, Plt elevated at 442  No transfusions this admission (transfuse if Hgb < 7)  Bleeding: GI reports Occasional scant blood with BM, no other overt GI bleeding.  Based on colonoscopy findings, resumed heparin at 2600 units/hr  Heparin level at 20:20 = 0.29 units/ml - just slightly below desired range, no bleeding per RN  Goal of Therapy:  Heparin level 0.3-0.5 d/t large, friable colon mass seen on colonoscopy 7/3 Monitor platelets by anticoagulation protocol: Yes   Plan:   Increase Heparin slightly to 2650 units/hr   Daily heparin level and CBC  Follow up plan of care for mass and concern for ongoing bleeding from mass  Minda Ditto PharmD Pager [phone] 03/15/2018, 10:00 PM

## 2018-03-15 NOTE — H&P (View-Only) (Signed)
Patient ID: Mark Frey, male   DOB: 1956/05/25, 62 y.o.   MRN: [id number]    Progress Note   Subjective   Tolerated prep without difficulty-completed second liter about an hour ago- having liquid brown stool . No increase in abdominal  pain with prep  Right leg tight and uncomfortable   Objective   Vital signs in last 24 hours: Temp:  [98.1 F (36.7 C)-99.1 F (37.3 C)] 98.8 F (37.1 C) (07/03 0541) Pulse Rate:  [103-109] 103 (07/03 0541) Resp:  [16-18] 18 (07/03 0541) BP: (157-160)/(82-87) 157/82 (07/03 0541) SpO2:  [95 %-97 %] 95 % (07/03 0541) Last BM Date: 03/12/18 General:    white male  in NAD Heart:  Regular rate and rhythm; no murmurs Lungs: Respirations even and unlabored, lungs CTA bilaterally Abdomen:  Soft, obese nontender, fullness right lower quadrant and nondistended. Normal bowel sounds. Extremities:  RLE grossly swollen ,tight. Neurologic:  Alert and oriented,  grossly normal neurologically. Psych:  Cooperative. Normal mood and affect.   Lab Results: Recent Labs    03/13/18 0526 03/14/18 0508 03/15/18 0457  WBC 8.5 8.5 7.8  HGB 7.7* 7.9* 7.5*  HCT 24.8* 26.2* 25.1*  PLT 505* 442* 475*   BMET No results for input(s): NA, K, CL, CO2, GLUCOSE, BUN, CREATININE, CALCIUM in the last 72 hours. LFT No results for input(s): PROT, ALBUMIN, AST, ALT, ALKPHOS, BILITOT, BILIDIR, IBILI in the last 72 hours. PT/INR Recent Labs    03/13/18 0526  LABPROT 14.4  INR 1.13    Studies/Results: Ct Biopsy  Result Date: 03/13/2018 CLINICAL DATA:  Large infiltrative masses of the peritoneal cavity. EXAM: CT GUIDED CORE BIOPSY OF PERITONEAL MASS ANESTHESIA/SEDATION: 2.0 mg IV Versed; 100 mcg IV Fentanyl Total Moderate Sedation Time:  17 minutes. The patient's level of consciousness and physiologic status were continuously monitored during the procedure by Radiology nursing. PROCEDURE: The procedure risks, benefits, and alternatives were explained to the patient.  Questions regarding the procedure were encouraged and answered. The patient understands and consents to the procedure. A time-out was performed prior to initiating the procedure. The operative field was prepped with chlorhexidine in a sterile fashion, and a sterile drape was applied covering the operative field. A sterile gown and sterile gloves were used for the procedure. Local anesthesia was provided with 1% Lidocaine. Under CT guidance, a 17 gauge trocar needle was advanced into the right lower peritoneal cavity. Five separate coaxial 18 gauge core biopsy samples were obtained and submitted in both saline and formalin. Additional CT imaging was performed after outer needle removal. COMPLICATIONS: None FINDINGS: Right lower quadrant portion bulky peritoneal tumor was chosen for sampling. Solid tumor was obtained. There were no immediate complications. IMPRESSION: CT-guided core biopsy performed of bulky tumor in the right lower peritoneal cavity. Electronically Signed   By: Aletta Edouard M.D.   On: 03/13/2018 17:36       Assessment / Plan:    #1 62 yo WM with large infiltrating mass right peritoneum - s/p Ct bx 7/1- result pending  #2 normocytic anemia,heme + stool - scheduled for Colon /EGD later today -r/o lower/upper bowel involvement  With tumor HGB 7.5 stable - transfuse for hgb 7 or less  #3 RLE DVT/PE bilat - has been on heparin - off this am until post procedures      Contact  Amy Artesia, P.A.-C               [phone]   Amy Esterwood  03/15/2018, 8:51  AM   ________________________________________________________________________  Velora Heckler GI MD note:  I personally examined the patient, reviewed the data and agree with the assessment and plan described above.  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  Await final testing. Still plan on colonoscopy EGD today to check for significant other GI path or lymphoma involving the colon/UGI tract.  Needs  oncology input.   Owens Loffler, MD [facility] Pager [phone]

## 2018-03-15 NOTE — Anesthesia Preprocedure Evaluation (Addendum)
Anesthesia Evaluation  Patient identified by MRN, date of birth, ID band Patient awake    Reviewed: Allergy & Precautions, NPO status , Patient's Chart, lab work & pertinent test results  Airway Mallampati: I  TM Distance: >3 FB Neck ROM: Full    Dental no notable dental hx. (+) Teeth Intact   Pulmonary  Snores   Pulmonary exam normal breath sounds clear to auscultation       Cardiovascular hypertension, + Peripheral Vascular Disease  Normal cardiovascular exam Rhythm:Regular Rate:Normal  Bilateral PTE   Neuro/Psych negative neurological ROS  negative psych ROS   GI/Hepatic Neg liver ROS, Intra abdominal mass Gi bleeding   Endo/Other  diabetes, Well Controlled, Type 2, Oral Hypoglycemic AgentsMorbid obesity  Renal/GU negative Renal ROS  negative genitourinary   Musculoskeletal negative musculoskeletal ROS (+)   Abdominal (+) + obese,   Peds  Hematology  (+) anemia , Heparin stopped at 7 am. Anticoagulated due to DVT/PTE   Anesthesia Other Findings   Reproductive/Obstetrics                             Anesthesia Physical Anesthesia Plan  ASA: III  Anesthesia Plan: MAC   Post-op Pain Management:    Induction: Intravenous  PONV Risk Score and Plan: Ondansetron, Treatment may vary due to age or medical condition and Propofol infusion  Airway Management Planned: Natural Airway, Nasal Cannula and Simple Face Mask  Additional Equipment:   Intra-op Plan:   Post-operative Plan:   Informed Consent: I have reviewed the patients History and Physical, chart, labs and discussed the procedure including the risks, benefits and alternatives for the proposed anesthesia with the patient or authorized representative who has indicated his/her understanding and acceptance.     Plan Discussed with: Surgeon and CRNA  Anesthesia Plan Comments:         Anesthesia Quick Evaluation

## 2018-03-15 NOTE — Anesthesia Postprocedure Evaluation (Signed)
Anesthesia Post Note  Patient: Mark Frey  Procedure(s) Performed: COLONOSCOPY (N/A ) ESOPHAGOGASTRODUODENOSCOPY (EGD) (N/A ) BIOPSY     Patient location during evaluation: PACU Anesthesia Type: MAC Level of consciousness: awake and alert Pain management: pain level controlled Vital Signs Assessment: post-procedure vital signs reviewed and stable Respiratory status: spontaneous breathing, nonlabored ventilation and respiratory function stable Cardiovascular status: stable and blood pressure returned to baseline Postop Assessment: no apparent nausea or vomiting Anesthetic complications: no    Last Vitals:  Vitals:   03/15/18 1313 03/15/18 1320  BP: 126/73 (!) 157/92  Pulse: 98 96  Resp: (!) 22 (!) 26  Temp: (!) 36.4 C   SpO2: 100% 98%    Last Pain:  Vitals:   03/15/18 1313  TempSrc: Oral  PainSc: 0-No pain                 Tahlor Berenguer A.

## 2018-03-15 NOTE — Consult Note (Addendum)
[facility] Surgery Consult Note  SLATE DEBROUX Mar 30, 1956  [id number].    Requesting MD: Wyline Copas Chief Complaint/Reason for Consult: RLQ mass HPI:  Patient is a 62 year old male who presented to the hospital with RLE swelling and DVT. Found to have bilateral PEs. He is asymptomatic from this. Abdominal mass found incidentally during workup for DVT.   Patient states he was not having any symptoms prior to leg swelling. Denies chest pain or SOB. Denies abdominal pain, had not felt mass in abdomen, was tolerating and diet and having regular BMs. Had not noted any bloody stools or black tarry stools. Has never had a colonoscopy prior to today and did not do any other sort of colon cancer screening. Denies family history of colon cancer and denies personal history of cancer. Denies fevers, chills, night sweats, fatigue. Felt a little weaker over the last several months but attributed this to weight loss which was intentional. PMH significant for T2DM well controlled, hyperlipidemia and hx of cleft palate. Patient allergic to ciprofloxacin. Denies illicit drug use or tobacco use, drinks alcohol once per month on average.   Patient reports Dr. 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   ROS: Review of Systems  Constitutional: Positive for weight loss (intentional 20 lbs in last year). Negative for chills, fever and malaise/fatigue.  Respiratory: Negative for shortness of breath.   Cardiovascular: Positive for leg swelling. Negative for chest pain and palpitations.  Gastrointestinal: Negative for abdominal pain, blood in stool, constipation, diarrhea, melena, nausea and vomiting.  Genitourinary: Negative for dysuria, frequency and urgency.  Neurological: Positive for weakness (mild). Negative for dizziness, tingling and sensory change.    Family History  Problem Relation Age of Onset  . Lung cancer Mother        smoker  . Brain cancer Mother         metastasis  . AAA (abdominal aortic aneurysm) Father        smoker    Past Medical History:  Diagnosis Date  . ALLERGIC RHINITIS   . Diabetes mellitus   . Hyperlipidemia     Past Surgical History:  Procedure Laterality Date  . CLEFT PALATE REPAIR    . deviated septum repair     slight improvement  . TONSILLECTOMY      Social History:  reports that he has never smoked. He has never used smokeless tobacco. He reports that he drinks alcohol. He reports that he does not use drugs.  Allergies:  Allergies  Allergen Reactions  . Ciprofloxacin Other (See Comments)    Leg tingling    Medications Prior to Admission  Medication Sig Dispense Refill  . aspirin 81 MG tablet Take 81 mg by mouth daily.    . Choline Fenofibrate 135 MG capsule TAKE 1 CAPSULE DAILY 90 capsule 1  . glimepiride (AMARYL) 2 MG tablet Take 1 tablet (2 mg total) by mouth daily before breakfast. (Patient taking differently: Take 1 mg by mouth daily before breakfast. ) 90 tablet 3  . ibuprofen (ADVIL,MOTRIN) 200 MG tablet Take 200 mg by mouth every 6 (six) hours as needed for moderate pain.    [name] JANUMET 50-1000 MG tablet TAKE 1 TABLET TWICE DAILY  WITH MEALS 180 tablet 3  . Multiple Vitamin (MULTIVITAMIN) capsule Take 1 capsule by mouth daily.      [name] RIVAROXABAN PO Take 1 mg by mouth daily with supper.    [name] glucose blood (FREESTYLE TEST  STRIPS) test strip 1 each by Other route daily. Use as instructed     . Lancets (FREESTYLE) lancets 1 each by Other route as directed. Use as instructed       Blood pressure (!) 157/92, pulse 96, temperature (!) 97.5 F (36.4 C), temperature source Oral, resp. rate (!) 26, height 5' 9"  (1.753 m), weight 117.9 kg (260 lb), SpO2 98 %. Physical Exam: Physical Exam  Constitutional: He is oriented to person, place, and time. He appears well-developed. He is cooperative.  Non-toxic appearance. No distress.  HENT:  Head: Normocephalic and atraumatic.  Right Ear: External ear normal.   Left Ear: External ear normal.  Nose: Nose normal.  Eyes: Pupils are equal, round, and reactive to light. Conjunctivae, EOM and lids are normal. No scleral icterus.  Neck: Normal range of motion and phonation normal. Neck supple. No tracheal deviation present.  Cardiovascular: Normal rate and regular rhythm.  Pulses:      Radial pulses are 2+ on the right side, and 2+ on the left side.       Dorsalis pedis pulses are 1+ on the right side, and 2+ on the left side.  Pulmonary/Chest: Effort normal and breath sounds normal. He has no wheezes. He has no rhonchi. He has no rales.  Abdominal: Bowel sounds are normal. He exhibits mass (large palpable mass in the RLQ). He exhibits no distension. There is no tenderness. There is no rigidity, no rebound and no guarding.  Musculoskeletal:  Swelling of right calf and thigh, no erythema or pain with palpation  Neurological: He is alert and oriented to person, place, and time. No sensory deficit.  Skin: Skin is warm, dry and intact.  Psychiatric: He has a normal mood and affect. His behavior is normal.    Results for orders placed or performed during the hospital encounter of 03/10/18 (from the past 48 hour(s))  Glucose, capillary     Status: None   Collection Time: 03/13/18  3:44 PM  Result Value Ref Range   Glucose-Capillary 74 70 - 99 mg/dL  Glucose, capillary     Status: Abnormal   Collection Time: 03/13/18  3:57 PM  Result Value Ref Range   Glucose-Capillary 125 (H) 70 - 99 mg/dL  Glucose, capillary     Status: None   Collection Time: 03/13/18  5:27 PM  Result Value Ref Range   Glucose-Capillary 94 70 - 99 mg/dL  Glucose, capillary     Status: Abnormal   Collection Time: 03/13/18  8:11 PM  Result Value Ref Range   Glucose-Capillary 142 (H) 70 - 99 mg/dL  Glucose, capillary     Status: None   Collection Time: 03/14/18 12:16 AM  Result Value Ref Range   Glucose-Capillary 87 70 - 99 mg/dL  Glucose, capillary     Status: None   Collection  Time: 03/14/18  4:54 AM  Result Value Ref Range   Glucose-Capillary 97 70 - 99 mg/dL  CBC     Status: Abnormal   Collection Time: 03/14/18  5:08 AM  Result Value Ref Range   WBC 8.5 4.0 - 10.5 K/uL   RBC 3.03 (L) 4.22 - 5.81 MIL/uL   Hemoglobin 7.9 (L) 13.0 - 17.0 g/dL   HCT 26.2 (L) 39.0 - 52.0 %   MCV 86.5 78.0 - 100.0 fL   MCH 26.1 26.0 - 34.0 pg   MCHC 30.2 30.0 - 36.0 g/dL   RDW 17.0 (H) 11.5 - 15.5 %   Platelets 442 (H) 150 -  400 K/uL    Comment: Performed at [facility], [place] [address] [place], [place] [address]  Heparin level (unfractionated)     Status: None   Collection Time: 03/14/18  5:08 AM  Result Value Ref Range   Heparin Unfractionated 0.45 0.30 - 0.70 IU/mL    Comment: (NOTE) If heparin results are below expected values, and patient dosage has  been confirmed, suggest follow up testing of antithrombin III levels. Performed at [facility], [place] [address] [place], [place] [address]   Glucose, capillary     Status: None   Collection Time: 03/14/18  7:13 AM  Result Value Ref Range   Glucose-Capillary 95 70 - 99 mg/dL  Glucose, capillary     Status: None   Collection Time: 03/14/18 11:36 AM  Result Value Ref Range   Glucose-Capillary 91 70 - 99 mg/dL  Heparin level (unfractionated)     Status: None   Collection Time: 03/14/18 12:10 PM  Result Value Ref Range   Heparin Unfractionated 0.46 0.30 - 0.70 IU/mL    Comment: (NOTE) If heparin results are below expected values, and patient dosage has  been confirmed, suggest follow up testing of antithrombin III levels. Performed at [facility], [place] [address] [place],  [place] [address]   Glucose, capillary     Status: None   Collection Time: 03/14/18  4:05 PM  Result Value Ref Range   Glucose-Capillary 76 70 - 99 mg/dL  Glucose, capillary     Status: Abnormal   Collection Time: 03/14/18  8:30 PM  Result Value Ref Range   Glucose-Capillary 108 (H) 70  - 99 mg/dL  Glucose, capillary     Status: Abnormal   Collection Time: 03/15/18 12:15 AM  Result Value Ref Range   Glucose-Capillary 108 (H) 70 - 99 mg/dL  CBC     Status: Abnormal   Collection Time: 03/15/18  4:57 AM  Result Value Ref Range   WBC 7.8 4.0 - 10.5 K/uL   RBC 2.88 (L) 4.22 - 5.81 MIL/uL   Hemoglobin 7.5 (L) 13.0 - 17.0 g/dL   HCT 25.1 (L) 39.0 - 52.0 %   MCV 87.2 78.0 - 100.0 fL   MCH 26.0 26.0 - 34.0 pg   MCHC 29.9 (L) 30.0 - 36.0 g/dL   RDW 17.0 (H) 11.5 - 15.5 %   Platelets 475 (H) 150 - 400 K/uL    Comment: Performed at [facility], [place] [address], [place], [place] [address]  Heparin level (unfractionated)     Status: None   Collection Time: 03/15/18  4:57 AM  Result Value Ref Range   Heparin Unfractionated 0.60 0.30 - 0.70 IU/mL    Comment: (NOTE) If heparin results are below expected values, and patient dosage has  been confirmed, suggest follow up testing of antithrombin III levels. Performed at [facility], [place] [address] [place], [place] [address]   Glucose, capillary     Status: Abnormal   Collection Time: 03/15/18  5:38 AM  Result Value Ref Range   Glucose-Capillary 120 (H) 70 - 99 mg/dL  Glucose, capillary     Status: Abnormal   Collection Time: 03/15/18  7:53 AM  Result Value Ref Range   Glucose-Capillary 163 (H) 70 - 99 mg/dL  Glucose, capillary     Status: Abnormal   Collection Time: 03/15/18 11:44 AM  Result Value Ref Range   Glucose-Capillary 118 (H) 70 - 99 mg/dL   Ct Biopsy  Result Date: 03/13/2018 CLINICAL DATA:  Large infiltrative masses of the peritoneal cavity. EXAM: CT GUIDED CORE BIOPSY OF PERITONEAL MASS ANESTHESIA/SEDATION: 2.0 mg IV Versed; 100 mcg IV Fentanyl Total Moderate Sedation Time:  17 minutes. The patient's level of consciousness and physiologic status were continuously monitored during the procedure by Radiology nursing. PROCEDURE: The procedure risks, benefits, and alternatives were  explained to the patient. Questions regarding the procedure were encouraged and answered. The patient understands and consents to the procedure. A time-out was performed prior to initiating the procedure. The operative field was prepped with chlorhexidine in a sterile fashion, and a sterile drape was applied covering the operative field. A sterile gown and sterile gloves were used for the procedure. Local anesthesia was provided with 1% Lidocaine. Under CT guidance, a 17 gauge trocar needle was advanced into the right lower peritoneal cavity. Five separate coaxial 18 gauge core biopsy samples were obtained and submitted in both saline and formalin. Additional CT imaging was performed after outer needle removal. COMPLICATIONS: None FINDINGS: Right lower quadrant portion bulky peritoneal tumor was chosen for sampling. Solid tumor was obtained. There were no immediate complications. IMPRESSION: CT-guided core biopsy performed of bulky tumor in the right lower peritoneal cavity. Electronically Signed   By: Aletta Edouard M.D.   On: 03/13/2018 17:36      Assessment/Plan T2DM Elevated BP - currently uncontrolled Obesity - BMI 38.4 Bilateral PE RLE DVT - Right saphenofemoral junction, right common femoral vein, posterior tibial/peroneal veins ABL anemia - secondary to GI losses, H/H 7.5/25.1 today  Large R colon mass - CT 6/28: large irregular infiltrative solid mass in the right lower quadrant measuring up to 18.7 x 18.5 x 17.8 cm, which infiltrates and encases multiple distal small bowel loops and likely the ileocecal region. There is partial encasement of the sigmoid colon by the mass. There is prominent extension of the mass into the right lower retroperitoneum and extraperitoneal right pelvis with encasement of the right external iliac and proximal right common iliac vasculature and with infiltration of the right iliopsoas muscle. - s/p core bx 03/13/18 - s/p colonoscopy today, large friable mass  seen - s/p EGD today showed normal UGI tract - heme+ stool  - bx possibly significant for lymphoma? May need to check CEA level - will discuss with MD - discussed potential R hemicolectomy, would need pulmonary clearance prior to surgical intervention   FEN: clears only for now VTE: heparin gtt ID: no abx indicated  MD to see and make further recommendations regarding surgical planning  Brigid Re, [facility] Surgery 03/15/2018, 2:04 PM Pager: [phone] Consults: [phone] Mon-Fri 7:00 am-4:30 pm Sat-Sun 7:00 am-11:30 am

## 2018-03-15 NOTE — Progress Notes (Signed)
PROGRESS NOTE    Mark Frey  WFU:[id number] DOB: 14-May-1956 DOA: 03/10/2018 PCP: [name], MD    Brief Narrative:  Mark Frey is a 62 y.o. male with a history of diabetes mellitus that presented secondary to DVT found on ultrasound. He was also found to have a massive bilateral PE in addition to concern for abdominal malignancy on CT scan. Started on heparin IV.  Assessment & Plan:   Active Problems:   Hyperlipidemia   Controlled diabetes mellitus type II without complication (HCC)   Hypertension   Bilateral pulmonary embolism (HCC)   Anemia   Hypoglycemia   Occult blood in stools   Intraabdominal mass  Bilateral pulmonary embolism Involving lobar, segmental and subsegmental branches bilaterally with evidence of heart strain. Not a tPA candidate per PCCM. Started on heparin drip. Transthoracic Echocardiogram not significant for heart strain Patient is continued on heparin gtt for now  DVT Right saphenofemoral junction, right common femoral vein, posterior tibial/peroneal veins. -Will continue with heparin gtt  Intraabdominal mass Large. Concern for malignancy. Oncology was consulted. Discussed case with Oncology, awaiting official path results CT chest without evidence of metastatic disease Patient is status post EGD and colonoscopy.  Upper endoscopy unremarkable.  Colonoscopy notable for obstructing mass conditions for surgical consult Have consulted general surgery for assistance.  Possibility for surgery later this week. Have consulted pulmonary for preoperative pulmonary clearance in anticipation for surgery.  Elevated blood pressure No history of hypertension.  We will continue as needed hydralazine  Diabetes mellitus, type 2 Well controlled. Hemoglobin A1C of 5.4%. On Janumet and glimepiride as an outpatient. -SSI while inpatient  Heme positive stool Normocytic anemia No history of colonoscopy. Iron and iron sat low Colonoscopy findings  as per above We will repeat CBC in the morning, transfuse if hemoglobin less than 7.  Obesity Body mass index is 38.4 kg/m.  DVT prophylaxis: Heparin drip Code Status: Full code Family Communication: Patient in room, family at bedside Disposition Plan: Uncertain at this time  Consultants:   Oncology  Gastroenterology  General surgery  Pulmonary  Procedures:   EGD/colonoscopy on 03/15/2018  Antimicrobials: Anti-infectives (From admission, onward)   None       Subjective: Denies abdominal pain or nausea  Objective: Vitals:   03/15/18 0541 03/15/18 1155 03/15/18 1313 03/15/18 1320  BP: (!) 157/82 (!) 168/90 126/73 (!) 157/92  Pulse: (!) 103 (!) 109 98 96  Resp: 18 19 (!) 22 (!) 26  Temp: 98.8 F (37.1 C) 98 F (36.7 C) (!) 97.5 F (36.4 C)   TempSrc: Oral Oral Oral   SpO2: 95% 96% 100% 98%  Weight:  117.9 kg (260 lb)    Height:  5' 9"  (1.753 m)      Intake/Output Summary (Last 24 hours) at 03/15/2018 1621 Last data filed at 03/15/2018 1500 Gross per 24 hour  Intake 958 ml  Output 400 ml  Net 558 ml   Filed Weights   03/10/18 1215 03/15/18 1155  Weight: 117.9 kg (260 lb) 117.9 kg (260 lb)    Examination:  General exam: Appears calm and comfortable  Respiratory system: Clear to auscultation. Respiratory effort normal. Cardiovascular system: S1 & S2 heard, RRR. Gastrointestinal system: Abdomen is nondistended, soft and nontender. No organomegaly or masses felt. Normal bowel sounds heard. Central nervous system: Alert and oriented. No focal neurological deficits. Extremities: Symmetric 5 x 5 power. Skin: No rashes, lesions  Psychiatry: Judgement and insight appear normal. Mood & affect appropriate.  Data Reviewed: I have personally reviewed following labs and imaging studies  CBC: Recent Labs  Lab 03/12/18 0037 03/12/18 0853 2018/03/24 0526 03/14/18 0508 03/15/18 0457  WBC 7.9 9.0 8.5 8.5 7.8  HGB 8.1* 8.8* 7.7* 7.9* 7.5*  HCT 26.0* 28.5*  24.8* 26.2* 25.1*  MCV 86.4 86.4 85.5 86.5 87.2  PLT 498* 511* 505* 442* 678*   Basic Metabolic Panel: Recent Labs  Lab 03/10/18 1530 03/11/18 0152  NA 142 141  K 3.6 3.6  CL 108 109  CO2 23 22  GLUCOSE 53* 74  BUN 17 15  CREATININE 1.11 1.03  CALCIUM 9.2 8.9  MG  --  1.7  PHOS  --  3.1   GFR: Estimated Creatinine Clearance: 95.4 mL/min (by C-G formula based on SCr of 1.03 mg/dL). Liver Function Tests: Recent Labs  Lab 03/10/18 1530 03/11/18 0152  AST 21 19  ALT 14 15  ALKPHOS 32* 29*  BILITOT 0.5 0.3  PROT 7.3 6.5  ALBUMIN 3.3* 2.8*   No results for input(s): LIPASE, AMYLASE in the last 168 hours. No results for input(s): AMMONIA in the last 168 hours. Coagulation Profile: Recent Labs  Lab 03/10/18 1847 03/24/2018 0526  INR 1.50 1.13   Cardiac Enzymes: Recent Labs  Lab 03/10/18 1914 03/11/18 0152 03/11/18 0733  TROPONINI <0.03 <0.03 <0.03   BNP (last 3 results) No results for input(s): PROBNP in the last 8760 hours. HbA1C: No results for input(s): HGBA1C in the last 72 hours. CBG: Recent Labs  Lab 03/15/18 0015 03/15/18 0538 03/15/18 0753 03/15/18 1144 03/15/18 1604  GLUCAP 108* 120* 163* 118* 127*   Lipid Profile: No results for input(s): CHOL, HDL, LDLCALC, TRIG, CHOLHDL, LDLDIRECT in the last 72 hours. Thyroid Function Tests: No results for input(s): TSH, T4TOTAL, FREET4, T3FREE, THYROIDAB in the last 72 hours. Anemia Panel: No results for input(s): VITAMINB12, FOLATE, FERRITIN, TIBC, IRON, RETICCTPCT in the last 72 hours. Sepsis Labs: No results for input(s): PROCALCITON, LATICACIDVEN in the last 168 hours.  Recent Results (from the past 240 hour(s))  MRSA PCR Screening     Status: None   Collection Time: 03/10/18 10:05 PM  Result Value Ref Range Status   MRSA by PCR NEGATIVE NEGATIVE Final    Comment:        The GeneXpert MRSA Assay (FDA approved for NASAL specimens only), is one component of a comprehensive MRSA  colonization surveillance program. It is not intended to diagnose MRSA infection nor to guide or monitor treatment for MRSA infections. Performed at [facility], [place] [address] [place], [place] [address]      Radiology Studies: Ct Biopsy  Result Date: March 24, 2018 CLINICAL DATA:  Large infiltrative masses of the peritoneal cavity. EXAM: CT GUIDED CORE BIOPSY OF PERITONEAL MASS ANESTHESIA/SEDATION: 2.0 mg IV Versed; 100 mcg IV Fentanyl Total Moderate Sedation Time:  17 minutes. The patient's level of consciousness and physiologic status were continuously monitored during the procedure by Radiology nursing. PROCEDURE: The procedure risks, benefits, and alternatives were explained to the patient. Questions regarding the procedure were encouraged and answered. The patient understands and consents to the procedure. A time-out was performed prior to initiating the procedure. The operative field was prepped with chlorhexidine in a sterile fashion, and a sterile drape was applied covering the operative field. A sterile gown and sterile gloves were used for the procedure. Local anesthesia was provided with 1% Lidocaine. Under CT guidance, a 17 gauge trocar needle was advanced into the right lower peritoneal cavity.  Five separate coaxial 18 gauge core biopsy samples were obtained and submitted in both saline and formalin. Additional CT imaging was performed after outer needle removal. COMPLICATIONS: None FINDINGS: Right lower quadrant portion bulky peritoneal tumor was chosen for sampling. Solid tumor was obtained. There were no immediate complications. IMPRESSION: CT-guided core biopsy performed of bulky tumor in the right lower peritoneal cavity. Electronically Signed   By: Aletta Edouard M.D.   On: 03/13/2018 17:36    Scheduled Meds: . docusate sodium  100 mg Oral BID  . fenofibrate  160 mg Oral Daily  . insulin aspart  0-9 Units Subcutaneous Q4H   Continuous Infusions: . heparin  2,600 Units/hr (03/15/18 1400)     LOS: 5 days   Marylu Lund, MD [facility] Pager [phone]  If 7PM-7AM, please contact night-coverage [email or web address] Password [email or web address] 03/15/2018, 4:21 PM

## 2018-03-15 NOTE — Progress Notes (Addendum)
Patient ID: Mark Frey, male   DOB: 09/06/1956, 62 y.o.   MRN: [id number]    Progress Note   Subjective   Tolerated prep without difficulty-completed second liter about an hour ago- having liquid brown stool . No increase in abdominal  pain with prep  Right leg tight and uncomfortable   Objective   Vital signs in last 24 hours: Temp:  [98.1 F (36.7 C)-99.1 F (37.3 C)] 98.8 F (37.1 C) (07/03 0541) Pulse Rate:  [103-109] 103 (07/03 0541) Resp:  [16-18] 18 (07/03 0541) BP: (157-160)/(82-87) 157/82 (07/03 0541) SpO2:  [95 %-97 %] 95 % (07/03 0541) Last BM Date: 03/12/18 General:    white male  in NAD Heart:  Regular rate and rhythm; no murmurs Lungs: Respirations even and unlabored, lungs CTA bilaterally Abdomen:  Soft, obese nontender, fullness right lower quadrant and nondistended. Normal bowel sounds. Extremities:  RLE grossly swollen ,tight. Neurologic:  Alert and oriented,  grossly normal neurologically. Psych:  Cooperative. Normal mood and affect.   Lab Results: Recent Labs    03/13/18 0526 03/14/18 0508 03/15/18 0457  WBC 8.5 8.5 7.8  HGB 7.7* 7.9* 7.5*  HCT 24.8* 26.2* 25.1*  PLT 505* 442* 475*   BMET No results for input(s): NA, K, CL, CO2, GLUCOSE, BUN, CREATININE, CALCIUM in the last 72 hours. LFT No results for input(s): PROT, ALBUMIN, AST, ALT, ALKPHOS, BILITOT, BILIDIR, IBILI in the last 72 hours. PT/INR Recent Labs    03/13/18 0526  LABPROT 14.4  INR 1.13    Studies/Results: Ct Biopsy  Result Date: 03/13/2018 CLINICAL DATA:  Large infiltrative masses of the peritoneal cavity. EXAM: CT GUIDED CORE BIOPSY OF PERITONEAL MASS ANESTHESIA/SEDATION: 2.0 mg IV Versed; 100 mcg IV Fentanyl Total Moderate Sedation Time:  17 minutes. The patient's level of consciousness and physiologic status were continuously monitored during the procedure by Radiology nursing. PROCEDURE: The procedure risks, benefits, and alternatives were explained to the patient.  Questions regarding the procedure were encouraged and answered. The patient understands and consents to the procedure. A time-out was performed prior to initiating the procedure. The operative field was prepped with chlorhexidine in a sterile fashion, and a sterile drape was applied covering the operative field. A sterile gown and sterile gloves were used for the procedure. Local anesthesia was provided with 1% Lidocaine. Under CT guidance, a 17 gauge trocar needle was advanced into the right lower peritoneal cavity. Five separate coaxial 18 gauge core biopsy samples were obtained and submitted in both saline and formalin. Additional CT imaging was performed after outer needle removal. COMPLICATIONS: None FINDINGS: Right lower quadrant portion bulky peritoneal tumor was chosen for sampling. Solid tumor was obtained. There were no immediate complications. IMPRESSION: CT-guided core biopsy performed of bulky tumor in the right lower peritoneal cavity. Electronically Signed   By: Aletta Edouard M.D.   On: 03/13/2018 17:36       Assessment / Plan:    #1 62 yo WM with large infiltrating mass right peritoneum - s/p Ct bx 7/1- result pending  #2 normocytic anemia,heme + stool - scheduled for Colon /EGD later today -r/o lower/upper bowel involvement  With tumor HGB 7.5 stable - transfuse for hgb 7 or less  #3 RLE DVT/PE bilat - has been on heparin - off this am until post procedures      Contact  Amy [place], P.A.-C               [phone]   Amy Esterwood  03/15/2018, 8:51  AM   ________________________________________________________________________  Velora Heckler GI MD note:  I personally examined the patient, reviewed the data and agree with the assessment and plan described above.  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  Await final testing. Still plan on colonoscopy EGD today to check for significant other GI path or lymphoma involving the colon/UGI tract.  Needs  oncology input.   Owens Loffler, MD [facility] Pager [phone]

## 2018-03-15 NOTE — Progress Notes (Signed)
ANTICOAGULATION CONSULT NOTE - Follow Up Consult  Pharmacy Consult for Heparin Indication: acute PE and DVT  Allergies  Allergen Reactions  . Ciprofloxacin Other (See Comments)    Leg tingling    Patient Measurements: Height: 5' 9"  (175.3 cm) Weight: 260 lb (117.9 kg) IBW/kg (Calculated) : 70.7 Heparin Dosing Weight: 97 kg  Vital Signs: Temp: 98.8 F (37.1 C) (07/03 0541) Temp Source: Oral (07/03 0541) BP: 157/82 (07/03 0541) Pulse Rate: 103 (07/03 0541)  Labs: Recent Labs    03/12/18 0853  03/12/18 1445 03/13/18 0526 03/14/18 0508 03/14/18 1210 03/15/18 0457  HGB 8.8*  --   --  7.7* 7.9*  --  7.5*  HCT 28.5*  --   --  24.8* 26.2*  --  25.1*  PLT 511*  --   --  505* 442*  --  475*  APTT 73*  --  66* 48*  --   --   --   LABPROT  --   --   --  14.4  --   --   --   INR  --   --   --  1.13  --   --   --   HEPARINUNFRC  --    < > 0.47 0.22* 0.45 0.46 0.60   < > = values in this interval not displayed.    Estimated Creatinine Clearance: 95.4 mL/min (by C-G formula based on SCr of 1.03 mg/dL).   Medications:  Infusions:    Assessment: 72 yoM went to his PCP office on 6/27 with c/o right leg edema and was sent home on xarelto 15 mg bid pending LE doppler.  Outpatient LE doppler showed acute right DVT involving the right saphenofemoral junction, right common femoral vein, posterior tibial and peroneal veins. He was sent to the ED on 6/28 for further workup and management of thromboembolism. Chest CTA on 6/28 showed acute bilateral PE with evidence of right hear strain and abd CT showed abdominal mass. Oncology consulted for intra-abdominal tumor. Heparin started on admission for acute DVT/PE.  Note hemoccult positive on admission.  Significant events: 7/1 - heparin off at 0800 (6 hrs) prior to IR biopsy. Resumed 4 hrs post procedure at 2100 7/3 - heparin held at 0700 prior to colonoscopy.   Today, 03/15/2018:  Heparin level: remains therapeutic though now above  desired range of 0.3-0.5 on 2700 units/hr  CBC: Hgb continues to slowly drift down, but still currently > 7; no transfusions ordered, Plt elevated at 442  No transfusions this admission (transfuse if Hgb < 7)  Bleeding: GI reports Occasional scant blood with BM, no other overt GI bleeding.   Goal of Therapy:  Heparin level 0.3-0.7 units/ml - aim for lower heparin level 0.3-0.5 d/t risk for GIB with abd mass. Monitor platelets by anticoagulation protocol: Yes   Plan:   F/u resumption of heparin after colonoscopy/EGD  Would resume heparin at 2700 units/hr; although most recent heparin level of 0.6 was above desired range, patient has been stable at this rate for several days, so would obtain an 8 hr confirmatory level before adjusting rate down given new clot  Daily heparin level and CBC  Follow up transition back to oral anticoagulation   Reuel Boom, PharmD, BCPS [phone] 03/15/2018, 7:37 AM

## 2018-03-15 NOTE — Op Note (Addendum)
[facility] Patient Name: Mark Frey Procedure Date: 03/15/2018 MRN: [id number] Attending MD: Milus Banister , MD Date of Birth: Mar 04, 1956 CSN: [id number] Age: 62 Admit Type: Inpatient Procedure:                Upper GI endoscopy Indications:              Abnormal CT of the GI tract, heme + stool, see                            colonoscopy report from today. Providers:                Milus Banister, MD, Tory Emerald, RN, Cherylynn Ridges, Technician Referring MD:              Medicines:                Monitored Anesthesia Care Complications:            No immediate complications. Estimated blood loss:                            None. Estimated Blood Loss:     Estimated blood loss: none. Procedure:                Pre-Anesthesia Assessment:                           - Prior to the procedure, a History and Physical                            was performed, and patient medications and                            allergies were reviewed. The patient's tolerance of                            previous anesthesia was also reviewed. The risks                            and benefits of the procedure and the sedation                            options and risks were discussed with the patient.                            All questions were answered, and informed consent                            was obtained. Prior Anticoagulants: The patient has                            taken heparin, last dose was day of procedure. ASA  Grade Assessment: IV - A patient with severe                            systemic disease that is a constant threat to life.                            After reviewing the risks and benefits, the patient                            was deemed in satisfactory condition to undergo the                            procedure.                           After obtaining informed consent, the endoscope was                    passed under direct vision. Throughout the                            procedure, the patient's blood pressure, pulse, and                            oxygen saturations were monitored continuously. The                            [id number] ) was introduced through the                            mouth, and advanced to the second part of duodenum.                            The upper GI endoscopy was accomplished without                            difficulty. The patient tolerated the procedure                            well. Scope In: Scope Out: Findings:      The esophagus was normal.      The stomach was normal.      The examined duodenum was normal. Impression:               - Normal UGI tract. The large right sided abdominal                            mass does not involve the UGI tract. Moderate Sedation:      N/A- Per Anesthesia Care Recommendation:           - Please call or page with any further questions or                            concerns.                           -  He needs surgery and oncology consultations while                            in hospital. Procedure Code(s):        --- Professional ---                           [id number], Esophagogastroduodenoscopy, flexible,                            transoral; diagnostic, including collection of                            specimen(s) by brushing or washing, when performed                            (separate procedure) Diagnosis Code(s):        --- Professional ---                           R93.3, Abnormal findings on diagnostic imaging of                            other parts of digestive tract CPT copyright 2017 [employer]. All rights reserved. The codes documented in this report are preliminary and upon coder review may  be revised to meet current compliance requirements. Milus Banister, MD 03/15/2018 1:10:34 PM This report has been signed electronically. Number of Addenda:  0

## 2018-03-15 NOTE — Transfer of Care (Signed)
Immediate Anesthesia Transfer of Care Note  Patient: Mark Frey  Procedure(s) Performed: COLONOSCOPY (N/A ) ESOPHAGOGASTRODUODENOSCOPY (EGD) (N/A )  Patient Location: PACU and Endoscopy Unit  Anesthesia Type:MAC  Level of Consciousness: awake, alert , oriented and patient cooperative  Airway & Oxygen Therapy: Patient Spontanous Breathing and Patient connected to face mask oxygen  Post-op Assessment: Report given to RN, Post -op Vital signs reviewed and stable and Patient moving all extremities  Post vital signs: Reviewed and stable  Last Vitals:  Vitals Value Taken Time  BP    Temp    Pulse 94 03/15/2018  1:13 PM  Resp 21 03/15/2018  1:13 PM  SpO2 100 % 03/15/2018  1:13 PM  Vitals shown include unvalidated device data.  Last Pain:  Vitals:   03/15/18 1155  TempSrc: Oral  PainSc: 0-No pain      Patients Stated Pain Goal: 3 (14/23/95 3202)  Complications: No apparent anesthesia complications

## 2018-03-16 DIAGNOSIS — C8510 Unspecified B-cell lymphoma, unspecified site: Secondary | ICD-10-CM

## 2018-03-16 LAB — GLUCOSE, CAPILLARY
GLUCOSE-CAPILLARY: 110 mg/dL — AB (ref 70–99)
GLUCOSE-CAPILLARY: 122 mg/dL — AB (ref 70–99)
GLUCOSE-CAPILLARY: 141 mg/dL — AB (ref 70–99)
Glucose-Capillary: 115 mg/dL — ABNORMAL HIGH (ref 70–99)
Glucose-Capillary: 138 mg/dL — ABNORMAL HIGH (ref 70–99)
Glucose-Capillary: 195 mg/dL — ABNORMAL HIGH (ref 70–99)
Glucose-Capillary: 182 mg/dL — ABNORMAL HIGH (ref 70–99)

## 2018-03-16 LAB — CBC
HCT: 23.9 % — ABNORMAL LOW (ref 39.0–52.0)
HEMOGLOBIN: 7.3 g/dL — AB (ref 13.0–17.0)
MCH: 26.1 pg (ref 26.0–34.0)
MCHC: 30.5 g/dL (ref 30.0–36.0)
MCV: 85.4 fL (ref 78.0–100.0)
PLATELETS: 500 10*3/uL — AB (ref 150–400)
RBC: 2.8 MIL/uL — AB (ref 4.22–5.81)
RDW: 17.4 % — ABNORMAL HIGH (ref 11.5–15.5)
WBC: 9 10*3/uL (ref 4.0–10.5)

## 2018-03-16 LAB — HEPARIN LEVEL (UNFRACTIONATED)
HEPARIN UNFRACTIONATED: 0.55 [IU]/mL (ref 0.30–0.70)
Heparin Unfractionated: 0.37 IU/mL (ref 0.30–0.70)
Heparin Unfractionated: 0.35 IU/mL (ref 0.30–0.70)

## 2018-03-16 LAB — HEMOGLOBIN AND HEMATOCRIT, BLOOD
HCT: 24 % — ABNORMAL LOW (ref 39.0–52.0)
HCT: 20.4 % — ABNORMAL LOW (ref 39.0–52.0)
HEMATOCRIT: 23.3 % — AB (ref 39.0–52.0)
HEMOGLOBIN: 7.4 g/dL — AB (ref 13.0–17.0)
Hemoglobin: 7 g/dL — ABNORMAL LOW (ref 13.0–17.0)
Hemoglobin: 6.2 g/dL — CL (ref 13.0–17.0)

## 2018-03-16 LAB — PREPARE RBC (CROSSMATCH)

## 2018-03-16 MED ORDER — FUROSEMIDE 10 MG/ML IJ SOLN
40.0000 mg | Freq: Every day | INTRAMUSCULAR | Status: DC
  Administered 2018-03-16: 40 mg via INTRAVENOUS
  Filled 2018-03-16: qty 4

## 2018-03-16 MED ORDER — SODIUM CHLORIDE 0.9% IV SOLUTION
Freq: Once | INTRAVENOUS | Status: AC
  Administered 2018-03-17: 14:00:00 via INTRAVENOUS

## 2018-03-16 MED ORDER — ONDANSETRON HCL 4 MG/2ML IJ SOLN: 4.0000 mg | Freq: Once | INTRAMUSCULAR | Status: AC | PRN

## 2018-03-16 MED ORDER — SODIUM CHLORIDE 0.9 % IV SOLN
510.0000 mg | Freq: Once | INTRAVENOUS | Status: AC
  Administered 2018-03-16: 510 mg via INTRAVENOUS
  Filled 2018-03-16: qty 17

## 2018-03-16 MED ORDER — CEFAZOLIN SODIUM-DEXTROSE 2-4 GM/100ML-% IV SOLN
2.0000 g | INTRAVENOUS | Status: DC
  Administered 2018-03-17: 2 g via INTRAVENOUS

## 2018-03-16 NOTE — Progress Notes (Signed)
Referring Physician(s): Feng,Y  Supervising Physician: Sandi Mariscal  Patient Status:  [facility] - In-pt  Chief Complaint:  Lymphoma  Subjective: Patient familiar to IR service from recent right peritoneal mass biopsy on 03/13/2018.  Pathology revealed B-cell lymphoma.  Past medical history also significant for diabetes and hyperlipidemia.  He has also been recently diagnosed with acute right lower extremity DVT with submassive PE, now on IV heparin.  He underwent colonoscopy and endoscopy on 7/3.  Findings revealed large right ascending colonic mass, likely due to abdominal lymphoma direct invasion.  Request now received from oncology for CT-guided bone marrow biopsy as well as Port-A-Cath/PEG placement.  He currently denies fever, headache, chest pain, worsening dyspnea, cough, worsening abdominal pain, nausea, vomiting.  He does have some right hip discomfort, occasional back pain, occasional rectal bleeding and significant right lower extremity edema secondary to DVT.  Latest WBC 9.0, hemoglobin 7.3, plts 500k.  Past Medical History:  Diagnosis Date  . ALLERGIC RHINITIS   . Diabetes mellitus   . Hyperlipidemia    Past Surgical History:  Procedure Laterality Date  . CLEFT PALATE REPAIR    . deviated septum repair     slight improvement  . TONSILLECTOMY         Allergies: Ciprofloxacin  Medications: Prior to Admission medications   Medication Sig Start Date End Date Taking? Authorizing Provider  aspirin 81 MG tablet Take 81 mg by mouth daily.   Yes [provider]  Choline Fenofibrate 135 MG capsule TAKE 1 CAPSULE DAILY 11/29/17  Yes [name], MD  glimepiride (AMARYL) 2 MG tablet Take 1 tablet (2 mg total) by mouth daily before breakfast. Patient taking differently: Take 1 mg by mouth daily before breakfast.  12/28/17  Yes [name], MD  ibuprofen (ADVIL,MOTRIN) 200 MG tablet Take 200 mg by mouth every 6 (six) hours as needed for moderate pain.   Yes  [provider]  JANUMET 50-1000 MG tablet TAKE 1 TABLET TWICE DAILY  WITH MEALS 11/29/17  Yes [name], MD  Multiple Vitamin (MULTIVITAMIN) capsule Take 1 capsule by mouth daily.     Yes [provider]  RIVAROXABAN PO Take 1 mg by mouth daily with supper.   Yes [provider]  glucose blood (FREESTYLE TEST STRIPS) test strip 1 each by Other route daily. Use as instructed     [provider]  Lancets (FREESTYLE) lancets 1 each by Other route as directed. Use as instructed     [provider]     Vital Signs: BP (!) 160/86 (BP Location: Left Arm)   Pulse 96   Temp 98 F (36.7 C) (Oral)   Resp 20   Ht _0  (1.753 m)   Wt 260 lb (117.9 kg)   SpO2 94%   BMI 38.40 kg/m   Physical Exam awake, alert.  Chest clear to auscultation bilaterally.  Heart with slightly tachycardic but regular rhythm.  Abdomen soft, obese, minimally tender right lower abdominal region with large, firm palpable abdominal mass primarily on right side.  Significant right lower extremity edema, trace pretibial edema on the left.  Imaging: US Scrotum  Result Date: 03/15/2018 CLINICAL DATA:  Scrotal swelling EXAM: SCROTAL ULTRASOUND DOPPLER ULTRASOUND OF THE TESTICLES TECHNIQUE: Complete ultrasound examination of the testicles, epididymis, and other scrotal structures was performed. Color and spectral Doppler ultrasound were also utilized to evaluate blood flow to the testicles. COMPARISON:  CT 03/10/2018 FINDINGS: Right testicle Measurements: 4.2 x 2.5 x  2.6 cm. No mass or microlithiasis visualized. Left testicle Measurements: 4.2 x 2.5 x 2.7 cm. No mass or microlithiasis visualized. Right epididymis:  Cyst measuring 9 x 8 x 11 mm Left epididymis:  Cyst measuring 6 x 4 mm Hydrocele:  Small left hydrocele.  Moderate right hydrocele. Varicocele:  Small right varicocele. Significant scrotal skin thickening with diffuse edema in the soft tissues Pulsed Doppler interrogation of  both testes demonstrates normal low resistance arterial and venous waveforms bilaterally. IMPRESSION: 1. Negative for testicular torsion or intratesticular mass 2. Bilateral hydroceles, small on the left and moderate on the right 3. Small right varicocele 4. Extensive scrotal edema and skin thickening Electronically Signed   By: Donavan Foil M.D.   On: 03/15/2018 20:19   Ct Biopsy  Result Date: 03/13/2018 CLINICAL DATA:  Large infiltrative masses of the peritoneal cavity. EXAM: CT GUIDED CORE BIOPSY OF PERITONEAL MASS ANESTHESIA/SEDATION: 2.0 mg IV Versed; 100 mcg IV Fentanyl Total Moderate Sedation Time:  17 minutes. The patient's level of consciousness and physiologic status were continuously monitored during the procedure by Radiology nursing. PROCEDURE: The procedure risks, benefits, and alternatives were explained to the patient. Questions regarding the procedure were encouraged and answered. The patient understands and consents to the procedure. A time-out was performed prior to initiating the procedure. The operative field was prepped with chlorhexidine in a sterile fashion, and a sterile drape was applied covering the operative field. A sterile gown and sterile gloves were used for the procedure. Local anesthesia was provided with 1% Lidocaine. Under CT guidance, a 17 gauge trocar needle was advanced into the right lower peritoneal cavity. Five separate coaxial 18 gauge core biopsy samples were obtained and submitted in both saline and formalin. Additional CT imaging was performed after outer needle removal. COMPLICATIONS: None FINDINGS: Right lower quadrant portion bulky peritoneal tumor was chosen for sampling. Solid tumor was obtained. There were no immediate complications. IMPRESSION: CT-guided core biopsy performed of bulky tumor in the right lower peritoneal cavity. Electronically Signed   By: Aletta Edouard M.D.   On: 03/13/2018 17:36   US Scrotum Doppler  Result Date: 03/15/2018 CLINICAL  DATA:  Scrotal swelling EXAM: SCROTAL ULTRASOUND DOPPLER ULTRASOUND OF THE TESTICLES TECHNIQUE: Complete ultrasound examination of the testicles, epididymis, and other scrotal structures was performed. Color and spectral Doppler ultrasound were also utilized to evaluate blood flow to the testicles. COMPARISON:  CT 03/10/2018 FINDINGS: Right testicle Measurements: 4.2 x 2.5 x 2.6 cm. No mass or microlithiasis visualized. Left testicle Measurements: 4.2 x 2.5 x 2.7 cm. No mass or microlithiasis visualized. Right epididymis:  Cyst measuring 9 x 8 x 11 mm Left epididymis:  Cyst measuring 6 x 4 mm Hydrocele:  Small left hydrocele.  Moderate right hydrocele. Varicocele:  Small right varicocele. Significant scrotal skin thickening with diffuse edema in the soft tissues Pulsed Doppler interrogation of both testes demonstrates normal low resistance arterial and venous waveforms bilaterally. IMPRESSION: 1. Negative for testicular torsion or intratesticular mass 2. Bilateral hydroceles, small on the left and moderate on the right 3. Small right varicocele 4. Extensive scrotal edema and skin thickening Electronically Signed   By: Donavan Foil M.D.   On: 03/15/2018 20:19    Labs:  CBC: Recent Labs    03/13/18 0526 03/14/18 0508 03/15/18 0457 03/16/18 0501  WBC 8.5 8.5 7.8 9.0  HGB 7.7* 7.9* 7.5* 7.3*  HCT 24.8* 26.2* 25.1* 23.9*  PLT 505* 442* 475* 500*    COAGS: Recent Labs    03/10/18  8978  03/12/18 0037 03/12/18 4784 03/12/18 1445 03/13/18 0526  INR 1.50  --   --   --   --  1.13  APTT 32   < > 60* 73* 66* 48*   < > = values in this interval not displayed.    BMP: Recent Labs    08/29/17 0941 03/10/18 1530 03/11/18 0152  NA 138 142 141  K 4.5 3.6 3.6  CL 102 108 109  CO2 _0 GLUCOSE 171* 53* 74  BUN _1 CALCIUM 9.6 9.2 8.9  CREATININE 1.08 1.11 1.03  GFRNONAA  --  >60 >60  GFRAA  --  >60 >60    LIVER FUNCTION TESTS: Recent Labs    08/29/17 0941 03/10/18 1530  03/11/18 0152  BILITOT 0.5 0.5 0.3  AST _2 ALT _3 ALKPHOS 32* 32* 29*  PROT 7.4 7.3 6.5  ALBUMIN 4.6 3.3* 2.8*    Assessment and Plan: Patient with history of diabetes, hyperlipidemia, obesity, recently diagnosed B-cell lymphoma with large right colonic mass, recent PE and right lower extremity DVT (on IV heparin), anemia.  Request received from oncology for CT-guided bone marrow biopsy as well as Port-A-Cath/PICC placement for chemotherapy.  Imaging studies have been reviewed by Dr. Pascal Lux.  Details/risks of above procedures, including but not limited to, internal bleeding, infection, injury to adjacent structures discussed with patient and spouse with their understanding and consent.  Procedures tentatively scheduled for 7/5.  IV heparin will be stopped at 0600 7/5.   Electronically Signed: D. Rowe Robert, PA-C 03/16/2018, 11:19 AM   I spent a total of 25 minutes at the the patient's bedside AND on the patient's hospital floor or unit, greater than 50% of which was counseling/coordinating care for CT-guided bone marrow biopsy and Port-A-Cath/PICC placement    Patient ID: [name], male   DOB: 1956/07/03, 62 y.o.   MRN: [id number]

## 2018-03-16 NOTE — Progress Notes (Signed)
PROGRESS NOTE    Mark Frey  HYQ:[id number] DOB: 1955/10/11 DOA: 03/10/2018 PCP: [name], MD    Brief Narrative:  Mark Frey is a 62 y.o. male with a history of diabetes mellitus that presented secondary to DVT found on ultrasound. He was also found to have a massive bilateral PE in addition to concern for abdominal malignancy on CT scan. Started on heparin IV.  Assessment & Plan:   Active Problems:   Hyperlipidemia   Controlled diabetes mellitus type II without complication (HCC)   Hypertension   Bilateral pulmonary embolism (HCC)   Anemia   Hypoglycemia   Occult blood in stools   Abdominal mass   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HCC)  Bilateral pulmonary embolism Involving lobar, segmental and subsegmental branches bilaterally with evidence of heart strain. Not a tPA candidate per PCCM. Started on heparin drip. Transthoracic Echocardiogram not significant for heart strain Patient is continued on heparin gtt as tolerated  DVT Right saphenofemoral junction, right common femoral vein, posterior tibial/peroneal veins. -Will continue with heparin gtt for now  Intraabdominal mass Large. Concern for malignancy. Oncology was consulted. Discussed case with Oncology, awaiting official path results CT chest without evidence of metastatic disease Patient is status post EGD and colonoscopy.  Upper endoscopy unremarkable.  Colonoscopy notable for obstructing mass conditions for surgical consult Pathology results notable for lymphoma.  Appreciate input by Oncology. Recommendation for chemo with intent on cure Port placement planned for 7/5  Elevated blood pressure No history of hypertension.  Continue on PRN hydralazine  Diabetes mellitus, type 2 Well controlled. Hemoglobin A1C of 5.4%. On Janumet and glimepiride as an outpatient. -Will continue on SSI while inpatient  Heme positive stool Normocytic anemia No history of colonoscopy. Iron and iron sat  low Colonoscopy findings as per above Fereheme ordered x 1 dose Hgb relatively stable, around low 7. Will transfuse 2 units PRBC's  Cautiously continue heparin for now given clot burden, low threshold for d/c if active bleeding  Obesity Body mass index is 38.4 kg/m.  Scrotal swelling Scrotal US reviewed. Scrotal swelling noted Arterial and venous doppler with good flow.  Continue patient on scheduled lasix  DVT prophylaxis: Heparin drip Code Status: Full code Family Communication: Patient in room, family at bedside Disposition Plan: Uncertain at this time  Consultants:   Oncology  Gastroenterology  General surgery  Pulmonary  Procedures:   EGD/colonoscopy on 03/15/2018  Antimicrobials: Anti-infectives (From admission, onward)   Start     Dose/Rate Route Frequency Ordered Stop   03/17/18 1000  ceFAZolin (ANCEF) IVPB 2g/100 mL premix  Status:  Discontinued     2 g 200 mL/hr over 30 Minutes Intravenous To Radiology 03/16/18 1132 03/16/18 1427      Subjective: Without abd pain, nausea, or sob  Objective: Vitals:   03/15/18 2028 03/16/18 0403 03/16/18 1250 03/16/18 1446  BP: (!) 145/67 (!) 160/86 (!) 164/86 (!) 146/67  Pulse: (!) 114 96 (!) 117 (!) 128  Resp: 16 20 18 18   Temp: (!) 97.4 F (36.3 C) 98 F (36.7 C) 99.4 F (37.4 C) 99.1 F (37.3 C)  TempSrc: Oral Oral Oral Oral  SpO2: 94% 94% 97% 96%  Weight:      Height:        Intake/Output Summary (Last 24 hours) at 03/16/2018 1736 Last data filed at 03/16/2018 1407 Gross per 24 hour  Intake 420.29 ml  Output 475 ml  Net -54.71 ml   Filed Weights   03/10/18 1215 03/15/18  1155  Weight: 117.9 kg (260 lb) 117.9 kg (260 lb)    Examination: General exam: Awake, laying in bed, in nad Respiratory system: Normal respiratory effort, no wheezing Cardiovascular system: regular rate, s1, s2 Gastrointestinal system: Soft, nondistended, positive BS Central nervous system: CN2-12 grossly intact, strength  intact Extremities: Perfused, no clubbing Skin: Normal skin turgor, no notable skin lesions seen Psychiatry: Mood normal // no visual hallucinations    Data Reviewed: I have personally reviewed following labs and imaging studies  CBC: Recent Labs  Lab 03/12/18 0853 03/13/18 0526 03/14/18 0508 03/15/18 0457 03/16/18 0501 03/16/18 1141 03/16/18 1452  WBC 9.0 8.5 8.5 7.8 9.0  --   --   HGB 8.8* 7.7* 7.9* 7.5* 7.3* 7.4* 7.0*  HCT 28.5* 24.8* 26.2* 25.1* 23.9* 24.0* 23.3*  MCV 86.4 85.5 86.5 87.2 85.4  --   --   PLT 511* 505* 442* 475* 500*  --   --    Basic Metabolic Panel: Recent Labs  Lab 03/10/18 1530 03/11/18 0152 03/15/18 2113  NA 142 141  --   K 3.6 3.6  --   CL 108 109  --   CO2 23 22  --   GLUCOSE 53* 74  --   BUN 17 15  --   CREATININE 1.11 1.03  --   CALCIUM 9.2 8.9  --   MG  --  1.7 2.0  PHOS  --  3.1 1.9*   GFR: Estimated Creatinine Clearance: 95.4 mL/min (by C-G formula based on SCr of 1.03 mg/dL). Liver Function Tests: Recent Labs  Lab 03/10/18 1530 03/11/18 0152  AST 21 19  ALT 14 15  ALKPHOS 32* 29*  BILITOT 0.5 0.3  PROT 7.3 6.5  ALBUMIN 3.3* 2.8*   No results for input(s): LIPASE, AMYLASE in the last 168 hours. No results for input(s): AMMONIA in the last 168 hours. Coagulation Profile: Recent Labs  Lab 03/10/18 1847 03/13/18 0526  INR 1.50 1.13   Cardiac Enzymes: Recent Labs  Lab 03/10/18 1914 03/11/18 0152 03/11/18 0733  TROPONINI <0.03 <0.03 <0.03   BNP (last 3 results) No results for input(s): PROBNP in the last 8760 hours. HbA1C: No results for input(s): HGBA1C in the last 72 hours. CBG: Recent Labs  Lab 03/16/18 0011 03/16/18 0405 03/16/18 0730 03/16/18 1129 03/16/18 1711  GLUCAP 110* 115* 122* 138* 141*   Lipid Profile: No results for input(s): CHOL, HDL, LDLCALC, TRIG, CHOLHDL, LDLDIRECT in the last 72 hours. Thyroid Function Tests: No results for input(s): TSH, T4TOTAL, FREET4, T3FREE, THYROIDAB in the  last 72 hours. Anemia Panel: Recent Labs    03/15/18 2113  RETICCTPCT 4.4*   Sepsis Labs: No results for input(s): PROCALCITON, LATICACIDVEN in the last 168 hours.  Recent Results (from the past 240 hour(s))  MRSA PCR Screening     Status: None   Collection Time: 03/10/18 10:05 PM  Result Value Ref Range Status   MRSA by PCR NEGATIVE NEGATIVE Final    Comment:        The GeneXpert MRSA Assay (FDA approved for NASAL specimens only), is one component of a comprehensive MRSA colonization surveillance program. It is not intended to diagnose MRSA infection nor to guide or monitor treatment for MRSA infections. Performed at [facility], [place] [address] [place], [place] [address]      Radiology Studies: US Scrotum  Result Date: 03/15/2018 CLINICAL DATA:  Scrotal swelling EXAM: SCROTAL ULTRASOUND DOPPLER ULTRASOUND OF THE TESTICLES TECHNIQUE: Complete ultrasound examination of the  testicles, epididymis, and other scrotal structures was performed. Color and spectral Doppler ultrasound were also utilized to evaluate blood flow to the testicles. COMPARISON:  CT 03/10/2018 FINDINGS: Right testicle Measurements: 4.2 x 2.5 x 2.6 cm. No mass or microlithiasis visualized. Left testicle Measurements: 4.2 x 2.5 x 2.7 cm. No mass or microlithiasis visualized. Right epididymis:  Cyst measuring 9 x 8 x 11 mm Left epididymis:  Cyst measuring 6 x 4 mm Hydrocele:  Small left hydrocele.  Moderate right hydrocele. Varicocele:  Small right varicocele. Significant scrotal skin thickening with diffuse edema in the soft tissues Pulsed Doppler interrogation of both testes demonstrates normal low resistance arterial and venous waveforms bilaterally. IMPRESSION: 1. Negative for testicular torsion or intratesticular mass 2. Bilateral hydroceles, small on the left and moderate on the right 3. Small right varicocele 4. Extensive scrotal edema and skin thickening Electronically Signed   By: Donavan Foil M.D.   On: 03/15/2018 20:19   US Scrotum Doppler  Result Date: 03/15/2018 CLINICAL DATA:  Scrotal swelling EXAM: SCROTAL ULTRASOUND DOPPLER ULTRASOUND OF THE TESTICLES TECHNIQUE: Complete ultrasound examination of the testicles, epididymis, and other scrotal structures was performed. Color and spectral Doppler ultrasound were also utilized to evaluate blood flow to the testicles. COMPARISON:  CT 03/10/2018 FINDINGS: Right testicle Measurements: 4.2 x 2.5 x 2.6 cm. No mass or microlithiasis visualized. Left testicle Measurements: 4.2 x 2.5 x 2.7 cm. No mass or microlithiasis visualized. Right epididymis:  Cyst measuring 9 x 8 x 11 mm Left epididymis:  Cyst measuring 6 x 4 mm Hydrocele:  Small left hydrocele.  Moderate right hydrocele. Varicocele:  Small right varicocele. Significant scrotal skin thickening with diffuse edema in the soft tissues Pulsed Doppler interrogation of both testes demonstrates normal low resistance arterial and venous waveforms bilaterally. IMPRESSION: 1. Negative for testicular torsion or intratesticular mass 2. Bilateral hydroceles, small on the left and moderate on the right 3. Small right varicocele 4. Extensive scrotal edema and skin thickening Electronically Signed   By: Donavan Foil M.D.   On: 03/15/2018 20:19    Scheduled Meds: . sodium chloride   Intravenous Once  . docusate sodium  100 mg Oral BID  . fenofibrate  160 mg Oral Daily  . furosemide  40 mg Intravenous Daily  . insulin aspart  0-9 Units Subcutaneous Q4H   Continuous Infusions: . heparin 2,600 Units/hr (03/16/18 0845)     LOS: 6 days   Marylu Lund, MD [facility] Pager [phone]  If 7PM-7AM, please contact night-coverage [email or web address] Password [facility] 03/16/2018, 5:36 PM

## 2018-03-16 NOTE — Progress Notes (Signed)
Mark Frey   DOB:Nov 29, 1955   MW#:[id number]   UVO#:[id number]  Oncology f/u note   Subjective: No event overnight, he denies any bleeding. VS stable, afebrile. No new complains.   Objective:  Vitals:   03/15/18 2028 03/16/18 0403  BP: (!) 145/67 (!) 160/86  Pulse: (!) 114 96  Resp: 16 20  Temp: (!) 97.4 F (36.3 C) 98 F (36.7 C)  SpO2: 94% 94%    Body mass index is 38.4 kg/m.  Intake/Output Summary (Last 24 hours) at 03/16/2018 1129 Last data filed at 03/16/2018 0700 Gross per 24 hour  Intake 946.29 ml  Output 150 ml  Net 796.29 ml     Sclerae unicteric  Oropharynx clear  No peripheral adenopathy  Lungs clear -- no rales or rhonchi  Heart regular rate and rhythm  Abdomen soft, mild tenderness at right side abdomen, there is a large palpable abdominal mass in the most of the right side abdomen.   MSK no focal spinal tenderness, no peripheral edema  Neuro nonfocal    CBG (last 3)  Recent Labs    03/16/18 0011 03/16/18 0405 03/16/18 0730  GLUCAP 110* 115* 122*     Labs:  Lab Results  Component Value Date   WBC 9.0 03/16/2018   HGB 7.3 (L) 03/16/2018   HCT 23.9 (L) 03/16/2018   MCV 85.4 03/16/2018   PLT 500 (H) 03/16/2018   NEUTROABS 2.8 10/21/2015    CMP Latest Ref Rng & Units 03/11/2018 03/10/2018 08/29/2017  Glucose 70 - 99 mg/dL 74 53(L) 171(H)  BUN 8 - 23 mg/dL 15 17 18   Creatinine 0.61 - 1.24 mg/dL 1.03 1.11 1.08  Sodium 135 - 145 mmol/L 141 142 138  Potassium 3.5 - 5.1 mmol/L 3.6 3.6 4.5  Chloride 98 - 111 mmol/L 109 108 102  CO2 22 - 32 mmol/L 22 23 27   Calcium 8.9 - 10.3 mg/dL 8.9 9.2 9.6  Total Protein 6.5 - 8.1 g/dL 6.5 7.3 7.4  Total Bilirubin 0.3 - 1.2 mg/dL 0.3 0.5 0.5  Alkaline Phos 38 - 126 U/L 29(L) 32(L) 32(L)  AST 15 - 41 U/L 19 21 21   ALT 0 - 44 U/L 15 14 29      Urine Studies No results for input(s): UHGB, CRYS in the last 72 hours.  Invalid input(s): UACOL, UAPR, USPG, UPH, UTP, UGL, UKET, UBIL, UNIT, UROB, ULEU, UEPI, UWBC,  URBC, UBAC, CAST, [place], [place]  Basic Metabolic Panel: Recent Labs  Lab 03/10/18 1530 03/11/18 0152 03/15/18 2113  NA 142 141  --   K 3.6 3.6  --   CL 108 109  --   CO2 23 22  --   GLUCOSE 53* 74  --   BUN 17 15  --   CREATININE 1.11 1.03  --   CALCIUM 9.2 8.9  --   MG  --  1.7 2.0  PHOS  --  3.1 1.9*   GFR Estimated Creatinine Clearance: 95.4 mL/min (by C-G formula based on SCr of 1.03 mg/dL). Liver Function Tests: Recent Labs  Lab 03/10/18 1530 03/11/18 0152  AST 21 19  ALT 14 15  ALKPHOS 32* 29*  BILITOT 0.5 0.3  PROT 7.3 6.5  ALBUMIN 3.3* 2.8*   No results for input(s): LIPASE, AMYLASE in the last 168 hours. No results for input(s): AMMONIA in the last 168 hours. Coagulation profile Recent Labs  Lab 03/10/18 1847 03/13/18 0526  INR 1.50 1.13    CBC: Recent Labs  Lab 03/12/18 0853 03/13/18  4818 03/14/18 0508 03/15/18 0457 03/16/18 0501  WBC 9.0 8.5 8.5 7.8 9.0  HGB 8.8* 7.7* 7.9* 7.5* 7.3*  HCT 28.5* 24.8* 26.2* 25.1* 23.9*  MCV 86.4 85.5 86.5 87.2 85.4  PLT 511* 505* 442* 475* 500*   Cardiac Enzymes: Recent Labs  Lab 03/10/18 1914 03/11/18 0152 03/11/18 0733  TROPONINI <0.03 <0.03 <0.03   BNP: Invalid input(s): POCBNP CBG: Recent Labs  Lab 03/15/18 1604 03/15/18 2019 03/16/18 0011 03/16/18 0405 03/16/18 0730  GLUCAP 127* 169* 110* 115* 122*   D-Dimer No results for input(s): DDIMER in the last 72 hours. Hgb A1c No results for input(s): HGBA1C in the last 72 hours. Lipid Profile No results for input(s): CHOL, HDL, LDLCALC, TRIG, CHOLHDL, LDLDIRECT in the last 72 hours. Thyroid function studies No results for input(s): TSH, T4TOTAL, T3FREE, THYROIDAB in the last 72 hours.  Invalid input(s): FREET3 Anemia work up Recent Labs    03/15/18 2113  RETICCTPCT 4.4*   Microbiology Recent Results (from the past 240 hour(s))  MRSA PCR Screening     Status: None   Collection Time: 03/10/18 10:05 PM  Result Value Ref Range Status    MRSA by PCR NEGATIVE NEGATIVE Final    Comment:        The GeneXpert MRSA Assay (FDA approved for NASAL specimens only), is one component of a comprehensive MRSA colonization surveillance program. It is not intended to diagnose MRSA infection nor to guide or monitor treatment for MRSA infections. Performed at [facility], [place] [address] [place],  [address]       Studies:  US Scrotum  Result Date: 03/15/2018 CLINICAL DATA:  Scrotal swelling EXAM: SCROTAL ULTRASOUND DOPPLER ULTRASOUND OF THE TESTICLES TECHNIQUE: Complete ultrasound examination of the testicles, epididymis, and other scrotal structures was performed. Color and spectral Doppler ultrasound were also utilized to evaluate blood flow to the testicles. COMPARISON:  CT 03/10/2018 FINDINGS: Right testicle Measurements: 4.2 x 2.5 x 2.6 cm. No mass or microlithiasis visualized. Left testicle Measurements: 4.2 x 2.5 x 2.7 cm. No mass or microlithiasis visualized. Right epididymis:  Cyst measuring 9 x 8 x 11 mm Left epididymis:  Cyst measuring 6 x 4 mm Hydrocele:  Small left hydrocele.  Moderate right hydrocele. Varicocele:  Small right varicocele. Significant scrotal skin thickening with diffuse edema in the soft tissues Pulsed Doppler interrogation of both testes demonstrates normal low resistance arterial and venous waveforms bilaterally. IMPRESSION: 1. Negative for testicular torsion or intratesticular mass 2. Bilateral hydroceles, small on the left and moderate on the right 3. Small right varicocele 4. Extensive scrotal edema and skin thickening Electronically Signed   By: Donavan Foil M.D.   On: 03/15/2018 20:19   US Scrotum Doppler  Result Date: 03/15/2018 CLINICAL DATA:  Scrotal swelling EXAM: SCROTAL ULTRASOUND DOPPLER ULTRASOUND OF THE TESTICLES TECHNIQUE: Complete ultrasound examination of the testicles, epididymis, and other scrotal structures was performed. Color and spectral Doppler ultrasound were  also utilized to evaluate blood flow to the testicles. COMPARISON:  CT 03/10/2018 FINDINGS: Right testicle Measurements: 4.2 x 2.5 x 2.6 cm. No mass or microlithiasis visualized. Left testicle Measurements: 4.2 x 2.5 x 2.7 cm. No mass or microlithiasis visualized. Right epididymis:  Cyst measuring 9 x 8 x 11 mm Left epididymis:  Cyst measuring 6 x 4 mm Hydrocele:  Small left hydrocele.  Moderate right hydrocele. Varicocele:  Small right varicocele. Significant scrotal skin thickening with diffuse edema in the soft tissues Pulsed Doppler interrogation of both testes demonstrates normal  low resistance arterial and venous waveforms bilaterally. IMPRESSION: 1. Negative for testicular torsion or intratesticular mass 2. Bilateral hydroceles, small on the left and moderate on the right 3. Small right varicocele 4. Extensive scrotal edema and skin thickening Electronically Signed   By: Donavan Foil M.D.   On: 03/15/2018 20:19    Assessment: 62 y.o. male with past medical history of diabetes and hyperlipidemia, presented with acute right lower extremity DVT, and further work-up found submassive PE and a large abdominal mass.  1. 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 Right ascending colon mass, likely due to abdominal lymphoma direct invasion 3.  Submassive PE and right lower extremity DVT, on heparin drip 4.  Microcytic anemia, likely secondary to GI bleeding and iron deficiency, rule out bone marrow involvement from lymphoma 5. Type 2 diabetes  6. Obesity  7. Anemia, iron deficient anemia, rule out lymphoma marrow involvement  8.  Thrombocytosis secondary to iron deficiency and malignancy   Plan:  -lab reviewed, no evidence of TLS -H/H remains to be low 7's, no overt GI bleeding. Iron level low, I recommend iv Feraheme 554m once today -consider blood transfusion (irradiated blood) if Hg<7.0 -I have contacted IR Dr. WPascal Lux he will try to do bone  marrow biopsy tomorrow morning, and Port. If port can not be done, will get PICC line in tomorrow for chemo -I will keep him NPO after midnight  -will discuss with pathology tomorrow morning, after the final path signed out, will transfer pt to 6E to start chemo R-EPOCH tomorrow, I will inform pharmacy and 6E -will hold on prednisone for now  -continue close monitoring    YTruitt Merle MD 03/16/2018  11:29 AM

## 2018-03-16 NOTE — Progress Notes (Addendum)
ANTICOAGULATION CONSULT NOTE - Follow Up Consult  Pharmacy Consult for Heparin Indication: acute PE and DVT  Allergies  Allergen Reactions  . Ciprofloxacin Other (See Comments)    Leg tingling    Patient Measurements: Height: 5' 9" (175.3 cm) Weight: 260 lb (117.9 kg) IBW/kg (Calculated) : 70.7 Heparin Dosing Weight: 97 kg  Vital Signs: Temp: 99.1 F (37.3 C) (07/04 1446) Temp Source: Oral (07/04 1446) BP: 146/67 (07/04 1446) Pulse Rate: 128 (07/04 1446)  Labs: Recent Labs    03/14/18 0508  03/15/18 0457 03/15/18 2020 03/16/18 0501 03/16/18 1141 03/16/18 1452  HGB 7.9*  --  7.5*  --  7.3* 7.4* 7.0*  HCT 26.2*  --  25.1*  --  23.9* 24.0* 23.3*  PLT 442*  --  475*  --  500*  --   --   HEPARINUNFRC 0.45   < > 0.60 0.29* 0.55  --  0.37   < > = values in this interval not displayed.    Estimated Creatinine Clearance: 95.4 mL/min (by C-G formula based on SCr of 1.03 mg/dL).   Medications:  Infusions:  . ferumoxytol    . heparin 2,600 Units/hr (03/16/18 0845)    Assessment: 30 yoM went to his PCP office on 6/27 with c/o right leg edema and was sent home on xarelto 15 mg bid pending LE doppler.  Outpatient LE doppler showed acute right DVT involving the right saphenofemoral junction, right common femoral vein, posterior tibial and peroneal veins. He was sent to the ED on 6/28 for further workup and management of thromboembolism. Chest CTA on 6/28 showed acute bilateral PE with evidence of right heart strain and abd CT showed abdominal mass. Oncology consulted for intra-abdominal tumor. Heparin started on admission for acute DVT/PE.  Note hemoccult positive on admission.  Significant events: 7/1 - heparin off at 0800 (6 hrs) prior to IR biopsy. Resumed 4 hrs post procedure at 2100 7/3 - heparin held at 0700 prior to colonoscopy. 7/3 - Colonoscopy revealed large, friable, ulcerated mass of the right colon.  GI recommended be careful with anticoagulation, recommended  heparin for now pending decision re: plan of care.  Dr. Ardis Hughs ok with resuming heparin at this time but will shoot for lower end therapeutic goal  Today, 03/16/2018:  Heparin level now in therapeutic range after slight dose reduction.   CBC: Hgb continues to slowly drift down, checking q4h now.   No transfusions this admission (transfuse if Hgb < 7)  Bleeding: GI reports Occasional scant blood with BM, no other overt GI bleeding.    Goal of Therapy:  Heparin level 0.3-0.5 d/t large, friable colon mass seen on colonoscopy 7/3 Monitor platelets by anticoagulation protocol: Yes   Plan:   Continue heparin at 2600 units/hr.   Check a confirmatory heparin level with next H/H draw ~7pm.  Daily heparin level and CBC  Stopping heparin at 6am 03/17/18 for Bone marrow biopsy per radiology. Note also GI ordered heparin stopped at 7am for EGD/colonoscopy.    Romeo Rabon, PharmD. Mobile: [phone]. 03/16/2018,3:30 PM.

## 2018-03-16 NOTE — Progress Notes (Signed)
ANTICOAGULATION CONSULT NOTE - Follow Up Consult  Pharmacy Consult for Heparin Indication: acute PE and DVT  Allergies  Allergen Reactions  . Ciprofloxacin Other (See Comments)    Leg tingling    Patient Measurements: Height: 5' 9"  (175.3 cm) Weight: 260 lb (117.9 kg) IBW/kg (Calculated) : 70.7 Heparin Dosing Weight: 97 kg  Vital Signs: Temp: 98 F (36.7 C) (07/04 0403) Temp Source: Oral (07/04 0403) BP: 160/86 (07/04 0403) Pulse Rate: 96 (07/04 0403)  Labs: Recent Labs    03/14/18 0508  03/15/18 0457 03/15/18 2020 03/16/18 0501  HGB 7.9*  --  7.5*  --  7.3*  HCT 26.2*  --  25.1*  --  23.9*  PLT 442*  --  475*  --  500*  HEPARINUNFRC 0.45   < > 0.60 0.29* 0.55   < > = values in this interval not displayed.    Estimated Creatinine Clearance: 95.4 mL/min (by C-G formula based on SCr of 1.03 mg/dL).   Medications:  Infusions:  . heparin 2,650 Units/hr (03/15/18 2358)    Assessment: 38 yoM went to his PCP office on 6/27 with c/o right leg edema and was sent home on xarelto 15 mg bid pending LE doppler.  Outpatient LE doppler showed acute right DVT involving the right saphenofemoral junction, right common femoral vein, posterior tibial and peroneal veins. He was sent to the ED on 6/28 for further workup and management of thromboembolism. Chest CTA on 6/28 showed acute bilateral PE with evidence of right hear strain and abd CT showed abdominal mass. Oncology consulted for intra-abdominal tumor. Heparin started on admission for acute DVT/PE.  Note hemoccult positive on admission.  Significant events: 7/1 - heparin off at 0800 (6 hrs) prior to IR biopsy. Resumed 4 hrs post procedure at 2100 7/3 - heparin held at 0700 prior to colonoscopy. 7/3 - Colonoscopy revealed large, friable, ulcerated mass of the right colon.  GI recommended be careful with anticoagulation, recommended heparin for now pending decision re: plan of care.  Dr. Ardis Hughs ok with resuming heparin at this  time but will shoot for lower end therapeutic goal   Today, 03/16/2018:  Heparin level this morning was 0.55 (above lower end of goal) on heparin at 2650 units/hr  CBC: Hgb continues to slowly drift down, but still currently > 7; no transfusions ordered, Plt elevated at 500  No transfusions this admission (transfuse if Hgb < 7)  Bleeding: GI reports Occasional scant blood with BM, no other overt GI bleeding.    Goal of Therapy:  Heparin level 0.3-0.5 d/t large, friable colon mass seen on colonoscopy 7/3 Monitor platelets by anticoagulation protocol: Yes   Plan:   decrease Heparin slightly to 2600 units/hr   Daily heparin level and CBC  Follow up plan of care for mass and concern for ongoing bleeding from mass  [place] 03/16/2018, 7:28 AM Pager [phone]

## 2018-03-16 NOTE — Progress Notes (Signed)
Patient had BM with a large amount of blood clots. Dr. Wyline Copas notified and ordered H&H checks q4. Will continue to monitor.

## 2018-03-16 NOTE — Progress Notes (Addendum)
CRITICAL VALUE ALERT  Critical Value:  6.2 HGB  Date & Time Notied:  03/16/18  Provider Notified: Dr Maudie Mercury  Orders Received/Actions taken:  1940  Dr Maudie Mercury called back to acknowledge the page I sent him

## 2018-03-16 NOTE — Progress Notes (Signed)
I sent  Dr Earlie Counts   A message that Mr Files  HGB  OF 7.0. Orders are being placed

## 2018-03-17 ENCOUNTER — Inpatient Hospital Stay (HOSPITAL_COMMUNITY): Payer: BLUE CROSS/BLUE SHIELD

## 2018-03-17 ENCOUNTER — Encounter (HOSPITAL_COMMUNITY): Payer: Self-pay | Admitting: Radiology

## 2018-03-17 DIAGNOSIS — I82411 Acute embolism and thrombosis of right femoral vein: Secondary | ICD-10-CM

## 2018-03-17 DIAGNOSIS — D649 Anemia, unspecified: Secondary | ICD-10-CM | POA: Diagnosis not present

## 2018-03-17 HISTORY — PX: IR IMAGING GUIDED PORT INSERTION: IMG5740

## 2018-03-17 LAB — GLUCOSE, CAPILLARY
GLUCOSE-CAPILLARY: 140 mg/dL — AB (ref 70–99)
GLUCOSE-CAPILLARY: 194 mg/dL — AB (ref 70–99)
Glucose-Capillary: 143 mg/dL — ABNORMAL HIGH (ref 70–99)
Glucose-Capillary: 153 mg/dL — ABNORMAL HIGH (ref 70–99)
Glucose-Capillary: 165 mg/dL — ABNORMAL HIGH (ref 70–99)
Glucose-Capillary: 203 mg/dL — ABNORMAL HIGH (ref 70–99)

## 2018-03-17 LAB — HEPATIC FUNCTION PANEL
ALBUMIN: 2.7 g/dL — AB (ref 3.5–5.0)
ALT: 51 U/L — ABNORMAL HIGH (ref 0–44)
AST: 84 U/L — ABNORMAL HIGH (ref 15–41)
Alkaline Phosphatase: 36 U/L — ABNORMAL LOW (ref 38–126)
BILIRUBIN TOTAL: 0.9 mg/dL (ref 0.3–1.2)
Bilirubin, Direct: 0.3 mg/dL — ABNORMAL HIGH (ref 0.0–0.2)
Indirect Bilirubin: 0.6 mg/dL (ref 0.3–0.9)
Total Protein: 6.1 g/dL — ABNORMAL LOW (ref 6.5–8.1)

## 2018-03-17 LAB — HEMOGLOBIN AND HEMATOCRIT, BLOOD
HCT: 22.3 % — ABNORMAL LOW (ref 39.0–52.0)
HEMATOCRIT: 23 % — AB (ref 39.0–52.0)
HEMOGLOBIN: 7.3 g/dL — AB (ref 13.0–17.0)
Hemoglobin: 7.6 g/dL — ABNORMAL LOW (ref 13.0–17.0)

## 2018-03-17 LAB — CBC WITH DIFFERENTIAL/PLATELET
BASOS ABS: 0 10*3/uL (ref 0.0–0.1)
BASOS PCT: 0 %
EOS PCT: 0 %
Eosinophils Absolute: 0 10*3/uL (ref 0.0–0.7)
HCT: 25.7 % — ABNORMAL LOW (ref 39.0–52.0)
HEMOGLOBIN: 8.4 g/dL — AB (ref 13.0–17.0)
Lymphocytes Relative: 11 %
Lymphs Abs: 1.4 10*3/uL (ref 0.7–4.0)
MCH: 28.3 pg (ref 26.0–34.0)
MCHC: 32.7 g/dL (ref 30.0–36.0)
MCV: 86.5 fL (ref 78.0–100.0)
Monocytes Absolute: 1 10*3/uL (ref 0.1–1.0)
Monocytes Relative: 8 %
NEUTROS PCT: 81 %
Neutro Abs: 9.9 10*3/uL — ABNORMAL HIGH (ref 1.7–7.7)
PLATELETS: 414 10*3/uL — AB (ref 150–400)
RBC: 2.97 MIL/uL — AB (ref 4.22–5.81)
RDW: 16.9 % — ABNORMAL HIGH (ref 11.5–15.5)
WBC: 12.3 10*3/uL — AB (ref 4.0–10.5)

## 2018-03-17 LAB — BASIC METABOLIC PANEL
ANION GAP: 12 (ref 5–15)
BUN: 33 mg/dL — ABNORMAL HIGH (ref 8–23)
CALCIUM: 8.1 mg/dL — AB (ref 8.9–10.3)
CO2: 21 mmol/L — ABNORMAL LOW (ref 22–32)
CREATININE: 1.66 mg/dL — AB (ref 0.61–1.24)
Chloride: 103 mmol/L (ref 98–111)
GFR, EST AFRICAN AMERICAN: 50 mL/min — AB (ref >60)
GFR, EST NON AFRICAN AMERICAN: 43 mL/min — AB (ref >60)
Glucose, Bld: 148 mg/dL — ABNORMAL HIGH (ref 70–99)
Potassium: 4.4 mmol/L (ref 3.5–5.1)
Sodium: 136 mmol/L (ref 135–145)

## 2018-03-17 LAB — HEPARIN LEVEL (UNFRACTIONATED): Heparin Unfractionated: <0.1 IU/mL — ABNORMAL LOW (ref 0.30–0.70)

## 2018-03-17 LAB — PROTIME-INR
INR: 1.22
PROTHROMBIN TIME: 15.3 s — AB (ref 11.4–15.2)

## 2018-03-17 MED ORDER — FENTANYL CITRATE (PF) 100 MCG/2ML IJ SOLN
INTRAMUSCULAR | Status: AC | PRN
  Administered 2018-03-17: 50 ug via INTRAVENOUS
  Administered 2018-03-17: 25 ug via INTRAVENOUS
  Administered 2018-03-17: 50 ug via INTRAVENOUS

## 2018-03-17 MED ORDER — HEPARIN SOD (PORK) LOCK FLUSH 100 UNIT/ML IV SOLN: 250.0000 [IU] | Freq: Once | INTRAVENOUS | Status: DC | PRN

## 2018-03-17 MED ORDER — SODIUM CHLORIDE 0.9% FLUSH: 10.0000 mL | INTRAVENOUS | Status: DC | PRN

## 2018-03-17 MED ORDER — SODIUM CHLORIDE 0.9% FLUSH: 3.0000 mL | INTRAVENOUS | Status: DC | PRN

## 2018-03-17 MED ORDER — LIDOCAINE-EPINEPHRINE (PF) 2 %-1:200000 IJ SOLN
INTRAMUSCULAR | Status: AC
  Filled 2018-03-17: qty 20

## 2018-03-17 MED ORDER — PREDNISONE 5 MG PO TABS
30.0000 mg | ORAL_TABLET | Freq: Every day | ORAL | Status: AC
  Administered 2018-03-18 – 2018-03-21 (×4): 30 mg via ORAL
  Filled 2018-03-17 (×4): qty 1

## 2018-03-17 MED ORDER — FENTANYL CITRATE (PF) 100 MCG/2ML IJ SOLN
INTRAMUSCULAR | Status: AC
  Filled 2018-03-17: qty 4

## 2018-03-17 MED ORDER — VINCRISTINE SULFATE CHEMO INJECTION 1 MG/ML
Freq: Once | INTRAVENOUS | Status: AC
  Administered 2018-03-17: 15:00:00 via INTRAVENOUS
  Filled 2018-03-17: qty 12

## 2018-03-17 MED ORDER — FENTANYL CITRATE (PF) 100 MCG/2ML IJ SOLN
INTRAMUSCULAR | Status: AC
  Filled 2018-03-17: qty 2

## 2018-03-17 MED ORDER — HEPARIN SOD (PORK) LOCK FLUSH 100 UNIT/ML IV SOLN: 500.0000 [IU] | Freq: Once | INTRAVENOUS | Status: DC | PRN

## 2018-03-17 MED ORDER — MIDAZOLAM HCL 2 MG/2ML IJ SOLN
INTRAMUSCULAR | Status: AC
  Filled 2018-03-17: qty 4

## 2018-03-17 MED ORDER — LACTATED RINGERS IV SOLN
INTRAVENOUS | Status: AC
  Administered 2018-03-17: 11:00:00 via INTRAVENOUS

## 2018-03-17 MED ORDER — CEFAZOLIN SODIUM-DEXTROSE 2-4 GM/100ML-% IV SOLN
INTRAVENOUS | Status: AC
  Administered 2018-03-17: 2 g via INTRAVENOUS
  Filled 2018-03-17: qty 100

## 2018-03-17 MED ORDER — HEPARIN (PORCINE) IN NACL 100-0.45 UNIT/ML-% IJ SOLN
2600.0000 [IU]/h | INTRAMUSCULAR | Status: DC
  Administered 2018-03-17: 2600 [IU]/h via INTRAVENOUS
  Filled 2018-03-17 (×2): qty 250

## 2018-03-17 MED ORDER — SODIUM CHLORIDE 0.9 % IV SOLN
INTRAVENOUS | Status: DC
  Administered 2018-03-17 – 2018-03-19 (×2): via INTRAVENOUS

## 2018-03-17 MED ORDER — MIDAZOLAM HCL 2 MG/2ML IJ SOLN
INTRAMUSCULAR | Status: AC | PRN
  Administered 2018-03-17 (×2): 1 mg via INTRAVENOUS
  Administered 2018-03-17: 0.5 mg via INTRAVENOUS

## 2018-03-17 MED ORDER — SODIUM CHLORIDE 0.9 % IV SOLN
Freq: Once | INTRAVENOUS | Status: AC
  Administered 2018-03-17: 8 mg via INTRAVENOUS
  Filled 2018-03-17: qty 4

## 2018-03-17 MED ORDER — INSULIN ASPART 100 UNIT/ML ~~LOC~~ SOLN
0.0000 [IU] | Freq: Three times a day (TID) | SUBCUTANEOUS | Status: DC
  Administered 2018-03-18 (×3): 2 [IU] via SUBCUTANEOUS
  Administered 2018-03-19: 5 [IU] via SUBCUTANEOUS
  Administered 2018-03-19: 1 [IU] via SUBCUTANEOUS
  Administered 2018-03-19: 2 [IU] via SUBCUTANEOUS
  Administered 2018-03-20: 3 [IU] via SUBCUTANEOUS
  Administered 2018-03-20: 2 [IU] via SUBCUTANEOUS
  Administered 2018-03-20: 5 [IU] via SUBCUTANEOUS
  Administered 2018-03-22: 3 [IU] via SUBCUTANEOUS
  Administered 2018-03-22: 5 [IU] via SUBCUTANEOUS
  Administered 2018-03-22 – 2018-03-23 (×2): 3 [IU] via SUBCUTANEOUS
  Administered 2018-03-23: 1 [IU] via SUBCUTANEOUS
  Administered 2018-03-23 – 2018-03-24 (×2): 2 [IU] via SUBCUTANEOUS
  Administered 2018-03-24 (×2): 1 [IU] via SUBCUTANEOUS
  Administered 2018-03-25 (×3): 2 [IU] via SUBCUTANEOUS

## 2018-03-17 MED ORDER — ALTEPLASE 2 MG IJ SOLR
2.0000 mg | Freq: Once | INTRAMUSCULAR | Status: DC | PRN
  Filled 2018-03-17: qty 2

## 2018-03-17 MED ORDER — HOT PACK MISC ONCOLOGY
1.0000 | Freq: Once | Status: DC | PRN
  Filled 2018-03-17: qty 1

## 2018-03-17 MED ORDER — VINCRISTINE SULFATE CHEMO INJECTION 1 MG/ML: Freq: Once | INTRAVENOUS | Status: DC

## 2018-03-17 MED ORDER — NALOXONE HCL 0.4 MG/ML IJ SOLN
INTRAMUSCULAR | Status: AC
  Filled 2018-03-17: qty 1

## 2018-03-17 MED ORDER — PREDNISONE 5 MG PO TABS
30.0000 mg | ORAL_TABLET | Freq: Two times a day (BID) | ORAL | Status: DC
  Administered 2018-03-17: 30 mg via ORAL
  Filled 2018-03-17 (×2): qty 1

## 2018-03-17 MED ORDER — SODIUM CHLORIDE 0.9% IV SOLUTION
Freq: Once | INTRAVENOUS | Status: AC
  Administered 2018-03-18: 01:00:00 via INTRAVENOUS

## 2018-03-17 MED ORDER — COLD PACK MISC ONCOLOGY
1.0000 | Freq: Once | Status: DC | PRN
  Filled 2018-03-17: qty 1

## 2018-03-17 MED ORDER — MIDAZOLAM HCL 2 MG/2ML IJ SOLN
INTRAMUSCULAR | Status: AC
  Filled 2018-03-17: qty 2

## 2018-03-17 MED ORDER — FLUMAZENIL 0.5 MG/5ML IV SOLN
INTRAVENOUS | Status: AC
  Filled 2018-03-17: qty 5

## 2018-03-17 NOTE — Progress Notes (Signed)
PROGRESS NOTE    Mark Frey  GPQ:[id number] DOB: 1956/08/26 DOA: 03/10/2018 PCP: [name], MD    Brief Narrative:  Mark Frey is a 62 y.o. male with a history of diabetes mellitus that presented secondary to DVT found on ultrasound. He was also found to have a massive bilateral PE in addition to concern for abdominal malignancy on CT scan. Started on heparin IV.  Assessment & Plan:   Active Problems:   Hyperlipidemia   Controlled diabetes mellitus type II without complication (HCC)   Hypertension   Bilateral pulmonary embolism (HCC)   Anemia   Hypoglycemia   Occult blood in stools   Abdominal mass   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HCC)  Bilateral pulmonary embolism Involving lobar, segmental and subsegmental branches bilaterally with evidence of heart strain.  -Patient noted to be not a tPA candidate per PCCM.  -Patient has since been continued onheparin drip.  -Transthoracic Echocardiogram not significant for heart strain with normal LVEF -On minimal O2 support  DVT -Right saphenofemoral junction, right common femoral vein, posterior tibial/peroneal veins. -Cautiously continuing heparin gtt for now -Heparin was held last night secondary to bloody stools and transfusion dependent anemia.  -Discussed with Oncology, recommendation to resume heparin with lower therapeutic goal -Discussed with Pharmacy and orders placed  Intraabdominal mass -Large mass noted on CT abd personally reviewed -CT chest without evidence of metastatic disease -Patient is status post EGD and colonoscopy.   -Upper endoscopy unremarkable.   -Colonoscopy notable for obstructing mass  -Pathology results notable for lymphoma.  -Appreciate input by Oncology. Recommendation for chemo with intent on cure with port placed on 7/5  Elevated blood pressure -No history of hypertension.  -Continue on PRN hydralazine -BP stable at present  Diabetes mellitus, type 2 -Well controlled.    -Hemoglobin A1C of 5.4%. currently continued on Janumet and glimepiride as an outpatient. -Will continue on SSI while inpatient  Heme positive stool Normocytic anemia -Colonoscopy findings as per above -Fereheme given 7/4 x 1 dose -Hgb trended down to below 7 on 7/4 and pt was transfused 2 units PRBC's  -Heparin briefly held -Discussed with Oncology with recs to cautiously continue heparin for now given clot burden, low threshold for d/c if active bleeding -Repeat CBC in AM  Obesity -Body mass index is 38.4 kg/m.  Scrotal swelling -Scrotal US reviewed. Scrotal swelling noted -Arterial and venous doppler with good flow.  -Given one dose of lasix, however now stopped given ARF below  ARF -Cr newly elevated to 1.6 this AM -Lasix stopped -Have started gentle IVF hydration x 12hrs -Repeat BMET in AM -Avoid nephrotoxic agents if possible  DVT prophylaxis: Heparin drip Code Status: Full code Family Communication: Patient in room, family at bedside Disposition Plan: Uncertain at this time  Consultants:   Oncology  Gastroenterology  General surgery  Pulmonary  Procedures:   EGD/colonoscopy on 03/15/2018  Antimicrobials: Anti-infectives (From admission, onward)   Start     Dose/Rate Route Frequency Ordered Stop   03/17/18 1000  ceFAZolin (ANCEF) IVPB 2g/100 mL premix  Status:  Discontinued     2 g 200 mL/hr over 30 Minutes Intravenous To Radiology 03/16/18 1132 03/17/18 1010      Subjective: Remains without sob, chest pain, abd pain. No LE pain  Objective: Vitals:   03/17/18 1005 03/17/18 1010 03/17/18 1015 03/17/18 1423  BP: (!) 153/82 (!) 143/78 (!) 150/79 135/72  Pulse: (!) 116 (!) 113 (!) 114 (!) 105  Resp: 19 (!) 21 15  18  Temp:    98.4 F (36.9 C)  TempSrc:    Oral  SpO2: 95% 94% 96% 94%  Weight:      Height:        Intake/Output Summary (Last 24 hours) at 03/17/2018 1500 Last data filed at 03/17/2018 0106 Gross per 24 hour  Intake 630 ml  Output  250 ml  Net 380 ml   Filed Weights   03/10/18 1215 03/15/18 1155  Weight: 117.9 kg (260 lb) 117.9 kg (260 lb)    Examination: General exam: Conversant, in no acute distress Respiratory system: normal chest rise, clear, no audible wheezing Cardiovascular system: regular rhythm, s1-s2 Gastrointestinal system: Nondistended, nontender, pos BS Central nervous system: No seizures, no tremors Extremities: No cyanosis, no joint deformities, RLE edema Skin: No rashes, no pallor Psychiatry: Affect normal // no auditory hallucinations   Data Reviewed: I have personally reviewed following labs and imaging studies  CBC: Recent Labs  Lab 03/13/18 0526 03/14/18 0508 03/15/18 0457 03/16/18 0501 03/16/18 1141 03/16/18 1452 03/16/18 1851 03/17/18 0618  WBC 8.5 8.5 7.8 9.0  --   --   --  12.3*  NEUTROABS  --   --   --   --   --   --   --  9.9*  HGB 7.7* 7.9* 7.5* 7.3* 7.4* 7.0* 6.2* 8.4*  HCT 24.8* 26.2* 25.1* 23.9* 24.0* 23.3* 20.4* 25.7*  MCV 85.5 86.5 87.2 85.4  --   --   --  86.5  PLT 505* 442* 475* 500*  --   --   --  510*   Basic Metabolic Panel: Recent Labs  Lab 03/10/18 1530 03/11/18 0152 03/15/18 2113 03/17/18 0618  NA 142 141  --  136  K 3.6 3.6  --  4.4  CL 108 109  --  103  CO2 23 22  --  21*  GLUCOSE 53* 74  --  148*  BUN 17 15  --  33*  CREATININE 1.11 1.03  --  1.66*  CALCIUM 9.2 8.9  --  8.1*  MG  --  1.7 2.0  --   PHOS  --  3.1 1.9*  --    GFR: Estimated Creatinine Clearance: 59.2 mL/min (A) (by C-G formula based on SCr of 1.66 mg/dL (H)). Liver Function Tests: Recent Labs  Lab 03/10/18 1530 03/11/18 0152 03/17/18 0618  AST 21 19 84*  ALT 14 15 51*  ALKPHOS 32* 29* 36*  BILITOT 0.5 0.3 0.9  PROT 7.3 6.5 6.1*  ALBUMIN 3.3* 2.8* 2.7*   No results for input(s): LIPASE, AMYLASE in the last 168 hours. No results for input(s): AMMONIA in the last 168 hours. Coagulation Profile: Recent Labs  Lab 03/10/18 1847 03/13/18 0526 03/17/18 0618  INR 1.50  1.13 1.22   Cardiac Enzymes: Recent Labs  Lab 03/10/18 1914 03/11/18 0152 03/11/18 0733  TROPONINI <0.03 <0.03 <0.03   BNP (last 3 results) No results for input(s): PROBNP in the last 8760 hours. HbA1C: No results for input(s): HGBA1C in the last 72 hours. CBG: Recent Labs  Lab 03/16/18 2016 03/17/18 0014 03/17/18 0438 03/17/18 0717 03/17/18 1220  GLUCAP 182* 165* 143* 140* 194*   Lipid Profile: No results for input(s): CHOL, HDL, LDLCALC, TRIG, CHOLHDL, LDLDIRECT in the last 72 hours. Thyroid Function Tests: No results for input(s): TSH, T4TOTAL, FREET4, T3FREE, THYROIDAB in the last 72 hours. Anemia Panel: Recent Labs    03/15/18 2113  RETICCTPCT 4.4*   Sepsis Labs: No results for  input(s): PROCALCITON, LATICACIDVEN in the last 168 hours.  Recent Results (from the past 240 hour(s))  MRSA PCR Screening     Status: None   Collection Time: 03/10/18 10:05 PM  Result Value Ref Range Status   MRSA by PCR NEGATIVE NEGATIVE Final    Comment:        The GeneXpert MRSA Assay (FDA approved for NASAL specimens only), is one component of a comprehensive MRSA colonization surveillance program. It is not intended to diagnose MRSA infection nor to guide or monitor treatment for MRSA infections. Performed at [facility], [place] [address], [place], [place] [address]      Radiology Studies: US Scrotum  Result Date: 03/15/2018 CLINICAL DATA:  Scrotal swelling EXAM: SCROTAL ULTRASOUND DOPPLER ULTRASOUND OF THE TESTICLES TECHNIQUE: Complete ultrasound examination of the testicles, epididymis, and other scrotal structures was performed. Color and spectral Doppler ultrasound were also utilized to evaluate blood flow to the testicles. COMPARISON:  CT 03/10/2018 FINDINGS: Right testicle Measurements: 4.2 x 2.5 x 2.6 cm. No mass or microlithiasis visualized. Left testicle Measurements: 4.2 x 2.5 x 2.7 cm. No mass or microlithiasis visualized. Right epididymis:  Cyst  measuring 9 x 8 x 11 mm Left epididymis:  Cyst measuring 6 x 4 mm Hydrocele:  Small left hydrocele.  Moderate right hydrocele. Varicocele:  Small right varicocele. Significant scrotal skin thickening with diffuse edema in the soft tissues Pulsed Doppler interrogation of both testes demonstrates normal low resistance arterial and venous waveforms bilaterally. IMPRESSION: 1. Negative for testicular torsion or intratesticular mass 2. Bilateral hydroceles, small on the left and moderate on the right 3. Small right varicocele 4. Extensive scrotal edema and skin thickening Electronically Signed   By: Donavan Foil M.D.   On: 03/15/2018 20:19   Ct Biopsy  Result Date: 03/17/2018 INDICATION: Recent diagnosis of lymphoma. Please perform CT-guided bone marrow biopsy for tissue diagnostic purposes. EXAM: CT-GUIDED BONE MARROW BIOPSY AND ASPIRATION MEDICATIONS: None ANESTHESIA/SEDATION: Fentanyl 100 mcg IV; Versed 2 mg IV Sedation Time: 10 Minutes; The patient was continuously monitored during the procedure by the interventional radiology nurse under my direct supervision. COMPLICATIONS: None immediate. PROCEDURE: Informed consent was obtained from the patient following an explanation of the procedure, risks, benefits and alternatives. The patient understands, agrees and consents for the procedure. All questions were addressed. A time out was performed prior to the initiation of the procedure. The patient was positioned left lateral decubitus and non-contrast localization CT was performed of the pelvis to demonstrate the iliac marrow spaces. The operative site was prepped and draped in the usual sterile fashion. Under sterile conditions and local anesthesia, a 22 gauge spinal needle was utilized for procedural planning. Next, an 11 gauge coaxial bone biopsy needle was advanced into the left iliac marrow space. Needle position was confirmed with CT imaging. Initially, bone marrow aspiration was performed. Next, a bone marrow  biopsy was obtained with the 11 gauge outer bone marrow device. Samples were prepared with the cytotechnologist and deemed adequate. The needle was removed intact. Hemostasis was obtained with compression and a dressing was placed. The patient tolerated the procedure well without immediate post procedural complication. IMPRESSION: Successful CT guided left iliac bone marrow aspiration and core biopsy. Electronically Signed   By: Sandi Mariscal M.D.   On: 03/17/2018 10:41   Ct Bone Marrow Biopsy  Result Date: 03/17/2018 INDICATION: Recent diagnosis of lymphoma. Please perform CT-guided bone marrow biopsy for tissue diagnostic purposes. EXAM: CT-GUIDED BONE MARROW BIOPSY AND ASPIRATION MEDICATIONS:  None ANESTHESIA/SEDATION: Fentanyl 100 mcg IV; Versed 2 mg IV Sedation Time: 10 Minutes; The patient was continuously monitored during the procedure by the interventional radiology nurse under my direct supervision. COMPLICATIONS: None immediate. PROCEDURE: Informed consent was obtained from the patient following an explanation of the procedure, risks, benefits and alternatives. The patient understands, agrees and consents for the procedure. All questions were addressed. A time out was performed prior to the initiation of the procedure. The patient was positioned left lateral decubitus and non-contrast localization CT was performed of the pelvis to demonstrate the iliac marrow spaces. The operative site was prepped and draped in the usual sterile fashion. Under sterile conditions and local anesthesia, a 22 gauge spinal needle was utilized for procedural planning. Next, an 11 gauge coaxial bone biopsy needle was advanced into the left iliac marrow space. Needle position was confirmed with CT imaging. Initially, bone marrow aspiration was performed. Next, a bone marrow biopsy was obtained with the 11 gauge outer bone marrow device. Samples were prepared with the cytotechnologist and deemed adequate. The needle was removed  intact. Hemostasis was obtained with compression and a dressing was placed. The patient tolerated the procedure well without immediate post procedural complication. IMPRESSION: Successful CT guided left iliac bone marrow aspiration and core biopsy. Electronically Signed   By: Sandi Mariscal M.D.   On: 03/17/2018 10:41   US Scrotum Doppler  Result Date: 03/15/2018 CLINICAL DATA:  Scrotal swelling EXAM: SCROTAL ULTRASOUND DOPPLER ULTRASOUND OF THE TESTICLES TECHNIQUE: Complete ultrasound examination of the testicles, epididymis, and other scrotal structures was performed. Color and spectral Doppler ultrasound were also utilized to evaluate blood flow to the testicles. COMPARISON:  CT 03/10/2018 FINDINGS: Right testicle Measurements: 4.2 x 2.5 x 2.6 cm. No mass or microlithiasis visualized. Left testicle Measurements: 4.2 x 2.5 x 2.7 cm. No mass or microlithiasis visualized. Right epididymis:  Cyst measuring 9 x 8 x 11 mm Left epididymis:  Cyst measuring 6 x 4 mm Hydrocele:  Small left hydrocele.  Moderate right hydrocele. Varicocele:  Small right varicocele. Significant scrotal skin thickening with diffuse edema in the soft tissues Pulsed Doppler interrogation of both testes demonstrates normal low resistance arterial and venous waveforms bilaterally. IMPRESSION: 1. Negative for testicular torsion or intratesticular mass 2. Bilateral hydroceles, small on the left and moderate on the right 3. Small right varicocele 4. Extensive scrotal edema and skin thickening Electronically Signed   By: Donavan Foil M.D.   On: 03/15/2018 20:19   Ir Imaging Guided Port Insertion  Result Date: 03/17/2018 INDICATION: Recent diagnosis of lymphoma. In need of durable intravenous access for chemotherapy administration. EXAM: IMPLANTED PORT A CATH PLACEMENT WITH ULTRASOUND AND FLUOROSCOPIC GUIDANCE COMPARISON:  CT the chest, abdomen and pelvis-03/10/2018 MEDICATIONS: Ancef 2 gm IV; The antibiotic was administered within an appropriate  time interval prior to skin puncture. ANESTHESIA/SEDATION: Moderate (conscious) sedation was employed during this procedure. A total of Versed 0.5 mg and Fentanyl 25 mcg was administered intravenously. Moderate Sedation Time: 26 minutes. The patient's level of consciousness and vital signs were monitored continuously by radiology nursing throughout the procedure under my direct supervision. CONTRAST:  None FLUOROSCOPY TIME:  12 seconds (5 mGy) COMPLICATIONS: None immediate. PROCEDURE: The procedure, risks, benefits, and alternatives were explained to the patient. Questions regarding the procedure were encouraged and answered. The patient understands and consents to the procedure. The right neck and chest were prepped with chlorhexidine in a sterile fashion, and a sterile drape was applied covering the operative field. Maximum barrier  sterile technique with sterile gowns and gloves were used for the procedure. A timeout was performed prior to the initiation of the procedure. Local anesthesia was provided with 1% lidocaine with epinephrine. After creating a small venotomy incision, a micropuncture kit was utilized to access the internal jugular vein. Real-time ultrasound guidance was utilized for vascular access including the acquisition of a permanent ultrasound image documenting patency of the accessed vessel. The microwire was utilized to measure appropriate catheter length. A subcutaneous port pocket was then created along the upper chest wall utilizing a combination of sharp and blunt dissection. The pocket was irrigated with sterile saline. A single lumen ISP power injectable port was chosen for placement. The 8 Fr catheter was tunneled from the port pocket site to the venotomy incision. The port was placed in the pocket. The external catheter was trimmed to appropriate length. At the venotomy, an 8 Fr peel-away sheath was placed over a guidewire under fluoroscopic guidance. The catheter was then placed through  the sheath and the sheath was removed. Final catheter positioning was confirmed and documented with a fluoroscopic spot radiograph. The port was accessed with a Huber needle and was noted to easily aspirate and flush. The Port a Catheter was left accessed for the initiation of chemotherapy later today. The venotomy site was closed with an interrupted 4-0 Vicryl suture. The port pocket incision was closed with interrupted 2-0 Vicryl suture and the skin was opposed with a running subcuticular 4-0 Vicryl suture. Dermabond and Steri-strips were applied to both incisions. Dressings were placed. The patient tolerated the procedure well without immediate post procedural complication. FINDINGS: After catheter placement, the tip lies within the superior cavoatrial junction. The catheter aspirates and flushes normally and is ready for immediate use. IMPRESSION: Successful placement of a right internal jugular approach power injectable Port-A-Cath. The catheter is ready for immediate use. Electronically Signed   By: Sandi Mariscal M.D.   On: 03/17/2018 10:42    Scheduled Meds: . docusate sodium  100 mg Oral BID  . DOXOrubicin/vinCRIStine/etoposide CHEMO IV infusion for Inpatient CI   Intravenous Once  . fenofibrate  160 mg Oral Daily  . fentaNYL      . insulin aspart  0-9 Units Subcutaneous Q4H  . lidocaine-EPINEPHrine      . predniSONE  30 mg Oral BID WC   Continuous Infusions: . sodium chloride    . heparin    . lactated ringers 75 mL/hr at 03/17/18 1045  . ondansetron (ZOFRAN) with dexamethasone (DECADRON) IV       LOS: 7 days   Marylu Lund, MD [facility] Pager [phone]  If 7PM-7AM, please contact night-coverage [email or web address] Password [email or web address] 03/17/2018, 3:00 PM

## 2018-03-17 NOTE — Procedures (Signed)
Pre-procedure Diagnosis: Lymphoma Post-procedure Diagnosis: Same  Technically successful CT guided bone marrow aspiration and biopsy of left iliac crest.   Complications: None Immediate  EBL: None  SignedSandi Mariscal Pager: [phone] 03/17/2018, 9:24 AM

## 2018-03-17 NOTE — Sedation Documentation (Signed)
Port a Cath procedure begun

## 2018-03-17 NOTE — Progress Notes (Addendum)
Mark Frey   DOB:22-Jun-1956   OK#:[id number]   LTR#:[id number]  Oncology f/u note   Subjective: Pt had several episodes of rectal bleeding with clots yesterday afternoon, hemoglobin dropped, required blood transfusion, also received IV Feraheme.  Heparin was stopped yesterday to the bleeding.  He underwent bone marrow biopsy and port placement by IR Dr. Pascal Lux this morning.  Procedure went well.  He was moved to 6 E. to start chemo today.  Objective:  Vitals:   03/17/18 1015 03/17/18 1423  BP: (!) 150/79 135/72  Pulse: (!) 114 (!) 105  Resp: 15 18  Temp:  98.4 F (36.9 C)  SpO2: 96% 94%    Body mass index is 38.4 kg/m.  Intake/Output Summary (Last 24 hours) at 03/17/2018 1736 Last data filed at 03/17/2018 1620 Gross per 24 hour  Intake 630 ml  Output 600 ml  Net 30 ml     Sclerae unicteric  Oropharynx clear  No peripheral adenopathy  Lungs clear -- no rales or rhonchi  Heart regular rate and rhythm  Abdomen soft, mild tenderness at right side abdomen, there is a large palpable abdominal mass in the most of the right side abdomen.   MSK no focal spinal tenderness, (+) pitting edema on b/l LEs, R>L   Neuro nonfocal    CBG (last 3)  Recent Labs    03/17/18 0717 03/17/18 1220 03/17/18 1649  GLUCAP 140* 194* 153*     Labs:  Lab Results  Component Value Date   WBC 12.3 (H) 03/17/2018   HGB 7.6 (L) 03/17/2018   HCT 23.0 (L) 03/17/2018   MCV 86.5 03/17/2018   PLT 414 (H) 03/17/2018   NEUTROABS 9.9 (H) 03/17/2018    CMP Latest Ref Rng & Units 03/17/2018 03/11/2018 03/10/2018  Glucose 70 - 99 mg/dL 148(H) 74 53(L)  BUN 8 - 23 mg/dL 33(H) 15 17  Creatinine 0.61 - 1.24 mg/dL 1.66(H) 1.03 1.11  Sodium 135 - 145 mmol/L 136 141 142  Potassium 3.5 - 5.1 mmol/L 4.4 3.6 3.6  Chloride 98 - 111 mmol/L 103 109 108  CO2 22 - 32 mmol/L 21(L) 22 23  Calcium 8.9 - 10.3 mg/dL 8.1(L) 8.9 9.2  Total Protein 6.5 - 8.1 g/dL 6.1(L) 6.5 7.3  Total Bilirubin 0.3 - 1.2 mg/dL 0.9 0.3 0.5   Alkaline Phos 38 - 126 U/L 36(L) 29(L) 32(L)  AST 15 - 41 U/L 84(H) 19 21  ALT 0 - 44 U/L 51(H) 15 14     Urine Studies No results for input(s): UHGB, CRYS in the last 72 hours.  Invalid input(s): UACOL, UAPR, USPG, UPH, UTP, UGL, UKET, UBIL, UNIT, UROB, ULEU, UEPI, UWBC, URBC, UBAC, CAST, [place], [place]  Basic Metabolic Panel: Recent Labs  Lab 03/11/18 0152 03/15/18 2113 03/17/18 0618  NA 141  --  136  K 3.6  --  4.4  CL 109  --  103  CO2 22  --  21*  GLUCOSE 74  --  148*  BUN 15  --  33*  CREATININE 1.03  --  1.66*  CALCIUM 8.9  --  8.1*  MG 1.7 2.0  --   PHOS 3.1 1.9*  --    GFR Estimated Creatinine Clearance: 59.2 mL/min (A) (by C-G formula based on SCr of 1.66 mg/dL (H)). Liver Function Tests: Recent Labs  Lab 03/11/18 0152 03/17/18 0618  AST 19 84*  ALT 15 51*  ALKPHOS 29* 36*  BILITOT 0.3 0.9  PROT 6.5 6.1*  ALBUMIN 2.8*  2.7*   No results for input(s): LIPASE, AMYLASE in the last 168 hours. No results for input(s): AMMONIA in the last 168 hours. Coagulation profile Recent Labs  Lab 03/10/18 1847 03/13/18 0526 03/17/18 0618  INR 1.50 1.13 1.22    CBC: Recent Labs  Lab 03/13/18 0526 03/14/18 0508 03/15/18 0457 03/16/18 0501 03/16/18 1141 03/16/18 1452 03/16/18 1851 03/17/18 0618 03/17/18 1630  WBC 8.5 8.5 7.8 9.0  --   --   --  12.3*  --   NEUTROABS  --   --   --   --   --   --   --  9.9*  --   HGB 7.7* 7.9* 7.5* 7.3* 7.4* 7.0* 6.2* 8.4* 7.6*  HCT 24.8* 26.2* 25.1* 23.9* 24.0* 23.3* 20.4* 25.7* 23.0*  MCV 85.5 86.5 87.2 85.4  --   --   --  86.5  --   PLT 505* 442* 475* 500*  --   --   --  414*  --    Cardiac Enzymes: Recent Labs  Lab 03/10/18 1914 03/11/18 0152 03/11/18 0733  TROPONINI <0.03 <0.03 <0.03   BNP: Invalid input(s): POCBNP CBG: Recent Labs  Lab 03/17/18 0014 03/17/18 0438 03/17/18 0717 03/17/18 1220 03/17/18 1649  GLUCAP 165* 143* 140* 194* 153*   D-Dimer No results for input(s): DDIMER in the last 72  hours. Hgb A1c No results for input(s): HGBA1C in the last 72 hours. Lipid Profile No results for input(s): CHOL, HDL, LDLCALC, TRIG, CHOLHDL, LDLDIRECT in the last 72 hours. Thyroid function studies No results for input(s): TSH, T4TOTAL, T3FREE, THYROIDAB in the last 72 hours.  Invalid input(s): FREET3 Anemia work up Recent Labs    03/15/18 2113  RETICCTPCT 4.4*   Microbiology Recent Results (from the past 240 hour(s))  MRSA PCR Screening     Status: None   Collection Time: 03/10/18 10:05 PM  Result Value Ref Range Status   MRSA by PCR NEGATIVE NEGATIVE Final    Comment:        The GeneXpert MRSA Assay (FDA approved for NASAL specimens only), is one component of a comprehensive MRSA colonization surveillance program. It is not intended to diagnose MRSA infection nor to guide or monitor treatment for MRSA infections. Performed at [facility], [place] [address], [place], [place] [address]    Pathology   Diagnosis 03/13/2018 Peritoneum, biopsy, RLQ cavity - HIGH GRADE B-CELL LYMPHOMA. - SEE COMMENT. Microscopic Comment Sections show needle core biopsy fragments of soft tissue displaying a dense and relatively monomorphic infiltrate of atypical medium to large lymphoid appearing cells displaying high nuclear cytoplasmic ratio, partially clumped to vesicular chromatin and single to multiple nucleoli associated with brisk mitosis, apoptosis and a starry sky pattern. The appearance is primarily diffuse with lack of atypical follicles or nodules. To further evaluate this process, flow cytometric analysis was performed ([id number]) and shows a monoclonal, kappa restricted B-cell population expressing pan B-cell antigens including CD20 associated with CD10 expression. No CD5 expression is identified. In addition, immunohistochemical stains were performed including BCL-2, BCL-6, CD10, CD20, CD3, CD30, CD34, LCA, CD5, CD79a, and TdT with appropriate controls. The  lymphoid cells are diffusely positive for LCA and are predominantly composed of B cells as seen with CD20 and CD79a associated with CD10 and BCL-6 expression. There is also patchy, weak BCL-2 expression. No significant staining is seen with CD30, CD34 or TdT and there is no apparent co-expression of CD5 in B-cell areas. There is a minor T-cell population in  the background as primarily highlighted with CD3. The overall findings are most consistent with high grade B-cell lymphoma. Based on the overall findings, the differential diagnosis includes diffuse large cell lymphoma, Burkitt lymphoma as well as Burkitt-like lymphoma. To evaluate for the presence of double or triple hit lymphoma, FISH studies will be performed on paraffin embedded tissue and the results will be reported separately. (BNS:ecj 03/17/2018)   Colon, biopsy, right tumor 03/15/2018 - ATYPICAL LYMPHOID PROLIFERATION CONSISTENT WITH NON-HODGKIN LYMPHOMA, SEE COMMENT.   Studies:  US Scrotum  Result Date: 03/15/2018 CLINICAL DATA:  Scrotal swelling EXAM: SCROTAL ULTRASOUND DOPPLER ULTRASOUND OF THE TESTICLES TECHNIQUE: Complete ultrasound examination of the testicles, epididymis, and other scrotal structures was performed. Color and spectral Doppler ultrasound were also utilized to evaluate blood flow to the testicles. COMPARISON:  CT 03/10/2018 FINDINGS: Right testicle Measurements: 4.2 x 2.5 x 2.6 cm. No mass or microlithiasis visualized. Left testicle Measurements: 4.2 x 2.5 x 2.7 cm. No mass or microlithiasis visualized. Right epididymis:  Cyst measuring 9 x 8 x 11 mm Left epididymis:  Cyst measuring 6 x 4 mm Hydrocele:  Small left hydrocele.  Moderate right hydrocele. Varicocele:  Small right varicocele. Significant scrotal skin thickening with diffuse edema in the soft tissues Pulsed Doppler interrogation of both testes demonstrates normal low resistance arterial and venous waveforms bilaterally. IMPRESSION: 1. Negative for testicular  torsion or intratesticular mass 2. Bilateral hydroceles, small on the left and moderate on the right 3. Small right varicocele 4. Extensive scrotal edema and skin thickening Electronically Signed   By: Donavan Foil M.D.   On: 03/15/2018 20:19   Ct Biopsy  Result Date: 03/17/2018 INDICATION: Recent diagnosis of lymphoma. Please perform CT-guided bone marrow biopsy for tissue diagnostic purposes. EXAM: CT-GUIDED BONE MARROW BIOPSY AND ASPIRATION MEDICATIONS: None ANESTHESIA/SEDATION: Fentanyl 100 mcg IV; Versed 2 mg IV Sedation Time: 10 Minutes; The patient was continuously monitored during the procedure by the interventional radiology nurse under my direct supervision. COMPLICATIONS: None immediate. PROCEDURE: Informed consent was obtained from the patient following an explanation of the procedure, risks, benefits and alternatives. The patient understands, agrees and consents for the procedure. All questions were addressed. A time out was performed prior to the initiation of the procedure. The patient was positioned left lateral decubitus and non-contrast localization CT was performed of the pelvis to demonstrate the iliac marrow spaces. The operative site was prepped and draped in the usual sterile fashion. Under sterile conditions and local anesthesia, a 22 gauge spinal needle was utilized for procedural planning. Next, an 11 gauge coaxial bone biopsy needle was advanced into the left iliac marrow space. Needle position was confirmed with CT imaging. Initially, bone marrow aspiration was performed. Next, a bone marrow biopsy was obtained with the 11 gauge outer bone marrow device. Samples were prepared with the cytotechnologist and deemed adequate. The needle was removed intact. Hemostasis was obtained with compression and a dressing was placed. The patient tolerated the procedure well without immediate post procedural complication. IMPRESSION: Successful CT guided left iliac bone marrow aspiration and core  biopsy. Electronically Signed   By: Sandi Mariscal M.D.   On: 03/17/2018 10:41   Ct Bone Marrow Biopsy  Result Date: 03/17/2018 INDICATION: Recent diagnosis of lymphoma. Please perform CT-guided bone marrow biopsy for tissue diagnostic purposes. EXAM: CT-GUIDED BONE MARROW BIOPSY AND ASPIRATION MEDICATIONS: None ANESTHESIA/SEDATION: Fentanyl 100 mcg IV; Versed 2 mg IV Sedation Time: 10 Minutes; The patient was continuously monitored during the procedure by the interventional radiology nurse  under my direct supervision. COMPLICATIONS: None immediate. PROCEDURE: Informed consent was obtained from the patient following an explanation of the procedure, risks, benefits and alternatives. The patient understands, agrees and consents for the procedure. All questions were addressed. A time out was performed prior to the initiation of the procedure. The patient was positioned left lateral decubitus and non-contrast localization CT was performed of the pelvis to demonstrate the iliac marrow spaces. The operative site was prepped and draped in the usual sterile fashion. Under sterile conditions and local anesthesia, a 22 gauge spinal needle was utilized for procedural planning. Next, an 11 gauge coaxial bone biopsy needle was advanced into the left iliac marrow space. Needle position was confirmed with CT imaging. Initially, bone marrow aspiration was performed. Next, a bone marrow biopsy was obtained with the 11 gauge outer bone marrow device. Samples were prepared with the cytotechnologist and deemed adequate. The needle was removed intact. Hemostasis was obtained with compression and a dressing was placed. The patient tolerated the procedure well without immediate post procedural complication. IMPRESSION: Successful CT guided left iliac bone marrow aspiration and core biopsy. Electronically Signed   By: Sandi Mariscal M.D.   On: 03/17/2018 10:41   US Scrotum Doppler  Result Date: 03/15/2018 CLINICAL DATA:  Scrotal swelling  EXAM: SCROTAL ULTRASOUND DOPPLER ULTRASOUND OF THE TESTICLES TECHNIQUE: Complete ultrasound examination of the testicles, epididymis, and other scrotal structures was performed. Color and spectral Doppler ultrasound were also utilized to evaluate blood flow to the testicles. COMPARISON:  CT 03/10/2018 FINDINGS: Right testicle Measurements: 4.2 x 2.5 x 2.6 cm. No mass or microlithiasis visualized. Left testicle Measurements: 4.2 x 2.5 x 2.7 cm. No mass or microlithiasis visualized. Right epididymis:  Cyst measuring 9 x 8 x 11 mm Left epididymis:  Cyst measuring 6 x 4 mm Hydrocele:  Small left hydrocele.  Moderate right hydrocele. Varicocele:  Small right varicocele. Significant scrotal skin thickening with diffuse edema in the soft tissues Pulsed Doppler interrogation of both testes demonstrates normal low resistance arterial and venous waveforms bilaterally. IMPRESSION: 1. Negative for testicular torsion or intratesticular mass 2. Bilateral hydroceles, small on the left and moderate on the right 3. Small right varicocele 4. Extensive scrotal edema and skin thickening Electronically Signed   By: Donavan Foil M.D.   On: 03/15/2018 20:19   Ir Imaging Guided Port Insertion  Result Date: 03/17/2018 INDICATION: Recent diagnosis of lymphoma. In need of durable intravenous access for chemotherapy administration. EXAM: IMPLANTED PORT A CATH PLACEMENT WITH ULTRASOUND AND FLUOROSCOPIC GUIDANCE COMPARISON:  CT the chest, abdomen and pelvis-03/10/2018 MEDICATIONS: Ancef 2 gm IV; The antibiotic was administered within an appropriate time interval prior to skin puncture. ANESTHESIA/SEDATION: Moderate (conscious) sedation was employed during this procedure. A total of Versed 0.5 mg and Fentanyl 25 mcg was administered intravenously. Moderate Sedation Time: 26 minutes. The patient's level of consciousness and vital signs were monitored continuously by radiology nursing throughout the procedure under my direct supervision.  CONTRAST:  None FLUOROSCOPY TIME:  12 seconds (5 mGy) COMPLICATIONS: None immediate. PROCEDURE: The procedure, risks, benefits, and alternatives were explained to the patient. Questions regarding the procedure were encouraged and answered. The patient understands and consents to the procedure. The right neck and chest were prepped with chlorhexidine in a sterile fashion, and a sterile drape was applied covering the operative field. Maximum barrier sterile technique with sterile gowns and gloves were used for the procedure. A timeout was performed prior to the initiation of the procedure. Local anesthesia was provided  with 1% lidocaine with epinephrine. After creating a small venotomy incision, a micropuncture kit was utilized to access the internal jugular vein. Real-time ultrasound guidance was utilized for vascular access including the acquisition of a permanent ultrasound image documenting patency of the accessed vessel. The microwire was utilized to measure appropriate catheter length. A subcutaneous port pocket was then created along the upper chest wall utilizing a combination of sharp and blunt dissection. The pocket was irrigated with sterile saline. A single lumen ISP power injectable port was chosen for placement. The 8 Fr catheter was tunneled from the port pocket site to the venotomy incision. The port was placed in the pocket. The external catheter was trimmed to appropriate length. At the venotomy, an 8 Fr peel-away sheath was placed over a guidewire under fluoroscopic guidance. The catheter was then placed through the sheath and the sheath was removed. Final catheter positioning was confirmed and documented with a fluoroscopic spot radiograph. The port was accessed with a Huber needle and was noted to easily aspirate and flush. The Port a Catheter was left accessed for the initiation of chemotherapy later today. The venotomy site was closed with an interrupted 4-0 Vicryl suture. The port pocket  incision was closed with interrupted 2-0 Vicryl suture and the skin was opposed with a running subcuticular 4-0 Vicryl suture. Dermabond and Steri-strips were applied to both incisions. Dressings were placed. The patient tolerated the procedure well without immediate post procedural complication. FINDINGS: After catheter placement, the tip lies within the superior cavoatrial junction. The catheter aspirates and flushes normally and is ready for immediate use. IMPRESSION: Successful placement of a right internal jugular approach power injectable Port-A-Cath. The catheter is ready for immediate use. Electronically Signed   By: Sandi Mariscal M.D.   On: 03/17/2018 10:42    Assessment: 62 y.o. male with past medical history of diabetes and hyperlipidemia, presented with acute right lower extremity DVT, and further work-up found submassive PE and a large abdominal mass.  1. 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 Right ascending colon mass, likely due to abdominal lymphoma direct invasion 3.  Submassive PE and right lower extremity DVT, on heparin drip 4.  Microcytic anemia, likely secondary to GI bleeding and iron deficiency, rule out bone marrow involvement from lymphoma 5. Type 2 diabetes  6. Obesity  7. Anemia, secondary to GI bleeding and iron deficient anemia, rule out lymphoma marrow involvement  8.  Thrombocytosis secondary to iron deficiency and malignancy 9. AKI probably secondary to diuretics    Plan:  -I spoke with pathologist Dr. Gari Crown this morning, both his abdominal mass biopsy and colon mass biopsy are consistent with high-grade B-cell lymphoma, differential including Burkitt lymphoma, double hit diffuse large B-cell lymphoma or Burkitt like lymphma. FISH has been ordered, and result will be back in a week -I recommend R-EPOCH, starting today --Chemotherapy consent: Side effects including but does not not limited to, fatigue, nausea, vomiting, diarrhea,  hair loss, neuropathy, fluid retention, renal and kidney dysfunction, neutropenic fever, needed for blood transfusion, bleeding, congestive heart failure, small risk of MDS and leukemia, were discussed with patient in great detail. He agrees to proceed. -The goal of therapy is curative, however he has high risk for recurrence (probably ~50%), he voiced good understanding. -will start [facility] today, and plan to give Rituxan next Monday or Tuesday  -Lab reviewed, he has developed a mild AKI, likely related to diuretics, Lasix has been held, will start gentle IV fluids. -He  is at high risk for tumor lysis syndrome, will check lab daily.  Labs from yesterday showed no evidence of tumor lysis. -He will need IT MTX, will arrange to be done next Tuesday or Wednesday by IR if possible, please consult IR again  -If no recurrent rectal bleeding, please restart heparin without bolus, and aim therapeutic APTT on low side  -blood transfusion (irradiated blood products) if Hb<7.5  -he will received steroids (prednisone 37m daily and dexa 17mas premeds for chemo) for 5 days, need to watch his BG closely  -I will ask my partner Dr. MaJana Hakimo see him over the weekend, I will be back on Monday   I spent a total of 60 mins in coordinate his care, >50% on face to face counseling.  YaTruitt MerleMD 03/17/2018  5:36 PM

## 2018-03-17 NOTE — Sedation Documentation (Signed)
Bone Marrow Bx completed.

## 2018-03-17 NOTE — Progress Notes (Signed)
Mark Frey had a moderate bloody BM with blood clots . I sent a text to DR CHIU. . Orders to follow

## 2018-03-17 NOTE — Procedures (Signed)
Pre Procedure Dx: Lymphoma Post Procedural Dx: Same  Successful placement of right IJ approach port-a-cath with tip at the superior caval atrial junction. The catheter is ready for immediate use.  Estimated Blood Loss: Minimal  Complications: None immediate.  Ronny Bacon, MD Pager #: [phone]

## 2018-03-17 NOTE — Progress Notes (Addendum)
ANTICOAGULATION CONSULT NOTE - Follow Up Consult  Pharmacy Consult for Heparin Indication: acute PE and DVT  Allergies  Allergen Reactions  . Ciprofloxacin Other (See Comments)    Leg tingling    Patient Measurements: Height: 5' 9"  (175.3 cm) Weight: 260 lb (117.9 kg) IBW/kg (Calculated) : 70.7 Heparin Dosing Weight: 97 kg  Vital Signs: Temp: 98.3 F (36.8 C) (07/05 0434) Temp Source: Oral (07/05 0434) BP: 150/79 (07/05 1015) Pulse Rate: 114 (07/05 1015)  Labs: Recent Labs    03/15/18 0457  03/16/18 0501  03/16/18 1452 03/16/18 1851 03/17/18 0618  HGB 7.5*  --  7.3*   < > 7.0* 6.2* 8.4*  HCT 25.1*  --  23.9*   < > 23.3* 20.4* 25.7*  PLT 475*  --  500*  --   --   --  414*  LABPROT  --   --   --   --   --   --  15.3*  INR  --   --   --   --   --   --  1.22  HEPARINUNFRC 0.60   < > 0.55  --  0.37 0.35 <0.10*  CREATININE  --   --   --   --   --   --  1.66*   < > = values in this interval not displayed.    Estimated Creatinine Clearance: 59.2 mL/min (A) (by C-G formula based on SCr of 1.66 mg/dL (H)).   Medications:  Infusions:  . sodium chloride    . lactated ringers 75 mL/hr at 03/17/18 1045  . ondansetron (ZOFRAN) with dexamethasone (DECADRON) IV      Assessment: 1 yoM went to his PCP office on 6/27 with c/o right leg edema and was sent home on xarelto 15 mg bid pending LE doppler.  Outpatient LE doppler showed acute right DVT involving the right saphenofemoral junction, right common femoral vein, posterior tibial and peroneal veins. He was sent to the ED on 6/28 for further workup and management of thromboembolism. Chest CTA on 6/28 showed acute bilateral PE with evidence of right heart strain and abd CT showed abdominal mass. Oncology consulted for intra-abdominal tumor. Heparin started on admission for acute DVT/PE.  Note hemoccult positive on admission.  Significant events: 7/1 - heparin off at 0800 (6 hrs) prior to IR biopsy. Resumed 4 hrs post procedure at  2100 7/3 - heparin held at 0700 prior to colonoscopy. 7/3 - Colonoscopy revealed large, friable, ulcerated mass of the right colon.  GI recommended be careful with anticoagulation, recommended heparin for now pending decision re: plan of care.  Dr. Ardis Hughs ok with resuming heparin at this time but will shoot for lower end therapeutic goal 7/4 - heparin drip stopped ~7P due to falling Hgb and bloody BM; transfused 2 units PRBC 7/5 - Underwent CT guided bone marrow biopsy and port-a-cath insertion. Hgb improved, heparin to be resumed with target low end of therapeutic range per Dr. Wyline Copas.  Heparin level was at goal yesterday on 2600 units/hr.   IR Procedure Consult - Anticoagulant/Antiplatelet PTA/Inpatient Med List Review by Pharmacist   Procedure: CT guided bone marrow biopsy and port-a-cath insertion    Completed: 10:43  Post-Procedural bleeding risk per IR MD assessment:  standard  Antithrombotic medications on inpatient or PTA profile prior to procedure:   IV heparin (therapeutic)   Recommended restart time per IR Post-Procedure Guidelines:  6hrs post procedure  Goal of Therapy:  Heparin level 0.3-0.5 d/t large, friable colon  mass seen on colonoscopy 7/3 Monitor platelets by anticoagulation protocol: Yes   Plan:   Resume heparin drip 2600 unit/shr at 17:00 tonight (6hrs post procedure).   Check heparin level in 8hrs  Daily heparin level and CBC  Monitor closely for signs/symptoms of bleeding  Peggyann Juba, PharmD, BCPS Pager: [phone] 03/17/2018,1:18 PM.

## 2018-03-17 NOTE — Progress Notes (Signed)
Chemo doses and calculations verified with Aldean Baker RN

## 2018-03-17 NOTE — Sedation Documentation (Signed)
Patient is resting comfortably with eyes closed in NAD.

## 2018-03-17 NOTE — Sedation Documentation (Signed)
Patient transferred to IR. Alert in NAD.

## 2018-03-17 NOTE — Progress Notes (Signed)
Biopsies from the colon mass show high grade NH lymphoma.

## 2018-03-18 ENCOUNTER — Other Ambulatory Visit: Payer: Self-pay | Admitting: Oncology

## 2018-03-18 DIAGNOSIS — C8330 Diffuse large B-cell lymphoma, unspecified site: Secondary | ICD-10-CM

## 2018-03-18 DIAGNOSIS — K922 Gastrointestinal hemorrhage, unspecified: Secondary | ICD-10-CM

## 2018-03-18 DIAGNOSIS — Z5111 Encounter for antineoplastic chemotherapy: Secondary | ICD-10-CM

## 2018-03-18 LAB — BASIC METABOLIC PANEL
ANION GAP: 10 (ref 5–15)
BUN: 31 mg/dL — AB (ref 8–23)
CHLORIDE: 104 mmol/L (ref 98–111)
CO2: 22 mmol/L (ref 22–32)
Calcium: 8.2 mg/dL — ABNORMAL LOW (ref 8.9–10.3)
Creatinine, Ser: 1.1 mg/dL (ref 0.61–1.24)
GFR calc Af Amer: >60 mL/min (ref >60)
GFR calc non Af Amer: >60 mL/min (ref >60)
Glucose, Bld: 168 mg/dL — ABNORMAL HIGH (ref 70–99)
POTASSIUM: 4.9 mmol/L (ref 3.5–5.1)
SODIUM: 136 mmol/L (ref 135–145)

## 2018-03-18 LAB — GLUCOSE, CAPILLARY
GLUCOSE-CAPILLARY: 161 mg/dL — AB (ref 70–99)
GLUCOSE-CAPILLARY: 195 mg/dL — AB (ref 70–99)

## 2018-03-18 LAB — MAGNESIUM: Magnesium: 2.4 mg/dL (ref 1.7–2.4)

## 2018-03-18 LAB — HEPATIC FUNCTION PANEL
ALBUMIN: 2.6 g/dL — AB (ref 3.5–5.0)
ALT: 38 U/L (ref 0–44)
AST: 37 U/L (ref 15–41)
Alkaline Phosphatase: 39 U/L (ref 38–126)
Bilirubin, Direct: 0.2 mg/dL (ref 0.0–0.2)
Indirect Bilirubin: 0.6 mg/dL (ref 0.3–0.9)
TOTAL PROTEIN: 6 g/dL — AB (ref 6.5–8.1)
Total Bilirubin: 0.8 mg/dL (ref 0.3–1.2)

## 2018-03-18 LAB — HEMOGLOBIN AND HEMATOCRIT, BLOOD
HEMATOCRIT: 24.6 % — AB (ref 39.0–52.0)
Hemoglobin: 7.9 g/dL — ABNORMAL LOW (ref 13.0–17.0)

## 2018-03-18 LAB — URIC ACID: URIC ACID, SERUM: 10.2 mg/dL — AB (ref 3.7–8.6)

## 2018-03-18 LAB — PREPARE RBC (CROSSMATCH)

## 2018-03-18 MED ORDER — VINCRISTINE SULFATE CHEMO INJECTION 1 MG/ML
Freq: Once | INTRAVENOUS | Status: AC
  Administered 2018-03-18: 15:00:00 via INTRAVENOUS
  Filled 2018-03-18: qty 12

## 2018-03-18 MED ORDER — SODIUM CHLORIDE 0.9 % IV SOLN
Freq: Once | INTRAVENOUS | Status: AC
  Administered 2018-03-18: 8 mg via INTRAVENOUS
  Filled 2018-03-18: qty 4

## 2018-03-18 MED ORDER — HEPARIN SODIUM (PORCINE) 5000 UNIT/ML IJ SOLN
5000.0000 [IU] | Freq: Two times a day (BID) | INTRAMUSCULAR | Status: AC
  Administered 2018-03-18 – 2018-03-20 (×5): 5000 [IU] via SUBCUTANEOUS
  Filled 2018-03-18 (×5): qty 1

## 2018-03-18 MED ORDER — ALLOPURINOL 300 MG PO TABS
300.0000 mg | ORAL_TABLET | Freq: Every day | ORAL | Status: DC
  Administered 2018-03-18 – 2018-03-25 (×8): 300 mg via ORAL
  Filled 2018-03-18 (×8): qty 1

## 2018-03-18 NOTE — Progress Notes (Signed)
Mark Frey   DOB:[birth year]   JJ#:[id number]   GYJ#:[id number]  Subjective:  Tolerated port placement and denies pain; eating normally, no N/V, no mouth sores or ST; last BM yesterday PM, "didn't look at it." Not getting OOB as feels very fatigued and RLE swelling makes him "tilted." Wife in room   Objective: middle aged White man examined in bed Vitals:   03/18/18 0117 03/18/18 0345  BP: 121/68 124/64  Pulse: 97 93  Resp: 20 16  Temp: 98.5 F (36.9 C) 98.4 F (36.9 C)  SpO2: 96% 95%    Body mass index is 39.8 kg/m.  Intake/Output Summary (Last 24 hours) at 03/18/2018 0941 Last data filed at 03/18/2018 0400 Gross per 24 hour  Intake 691.1 ml  Output 2200 ml  Net -1508.9 ml     Sclerae unicteric  Oropharynx shows no thrush or other lesions  Lungs no rales or wheezes--auscultated anterolaterally  Heart regular rate and rhythm  Abdomen soft, +BS  RLE swelling grade 1  Neuro nonfocal  Lines: port in R upper chest and line in L arm intact  CBG (last 3)  Recent Labs    03/17/18 1649 03/17/18 2132 03/18/18 0759  GLUCAP 153* 203* 161*     Labs:  Lab Results  Component Value Date   WBC 12.3 (H) 03/17/2018   HGB 7.9 (L) 03/18/2018   HCT 24.6 (L) 03/18/2018   MCV 86.5 03/17/2018   PLT 414 (H) 03/17/2018   NEUTROABS 9.9 (H) 03/17/2018    _0 @  Urine Studies No results for input(s): UHGB, CRYS in the last 72 hours.  Invalid input(s): UACOL, UAPR, USPG, UPH, UTP, UGL, UKET, UBIL, UNIT, UROB, ULEU, UEPI, UWBC, URBC, UBAC, CAST, [place], [place]  Basic Metabolic Panel: Recent Labs  Lab 03/15/18 2113 03/17/18 0618 03/18/18 0653  NA  --  136 136  K  --  4.4 4.9  CL  --  103 104  CO2  --  21* 22  GLUCOSE  --  148* 168*  BUN  --  33* 31*  CREATININE  --  1.66* 1.10  CALCIUM  --  8.1* 8.2*  MG 2.0  --  2.4  PHOS 1.9*  --   --    GFR Estimated Creatinine Clearance: 91.1 mL/min (by C-G formula based on SCr of 1.1 mg/dL). Liver Function Tests: Recent  Labs  Lab 03/17/18 0618 03/18/18 0653  AST 84* 37  ALT 51* 38  ALKPHOS 36* 39  BILITOT 0.9 0.8  PROT 6.1* 6.0*  ALBUMIN 2.7* 2.6*   No results for input(s): LIPASE, AMYLASE in the last 168 hours. No results for input(s): AMMONIA in the last 168 hours. Coagulation profile Recent Labs  Lab 03/13/18 0526 03/17/18 0618  INR 1.13 1.22    CBC: Recent Labs  Lab 03/13/18 0526 03/14/18 0508 03/15/18 0457 03/16/18 0501  03/16/18 1851 03/17/18 0618 03/17/18 1630 03/17/18 1944 03/18/18 0653  WBC 8.5 8.5 7.8 9.0  --   --  12.3*  --   --   --   NEUTROABS  --   --   --   --   --   --  9.9*  --   --   --   HGB 7.7* 7.9* 7.5* 7.3*   < > 6.2* 8.4* 7.6* 7.3* 7.9*  HCT 24.8* 26.2* 25.1* 23.9*   < > 20.4* 25.7* 23.0* 22.3* 24.6*  MCV 85.5 86.5 87.2 85.4  --   --  86.5  --   --   --  PLT 505* 442* 475* 500*  --   --  414*  --   --   --    < > = values in this interval not displayed.   Cardiac Enzymes: No results for input(s): CKTOTAL, CKMB, CKMBINDEX, TROPONINI in the last 168 hours. BNP: Invalid input(s): POCBNP CBG: Recent Labs  Lab 03/17/18 0717 03/17/18 1220 03/17/18 1649 03/17/18 2132 03/18/18 0759  GLUCAP 140* 194* 153* 203* 161*   D-Dimer No results for input(s): DDIMER in the last 72 hours. Hgb A1c No results for input(s): HGBA1C in the last 72 hours. Lipid Profile No results for input(s): CHOL, HDL, LDLCALC, TRIG, CHOLHDL, LDLDIRECT in the last 72 hours. Thyroid function studies No results for input(s): TSH, T4TOTAL, T3FREE, THYROIDAB in the last 72 hours.  Invalid input(s): FREET3 Anemia work up Recent Labs    03/15/18 2113  RETICCTPCT 4.4*   Microbiology Recent Results (from the past 240 hour(s))  MRSA PCR Screening     Status: None   Collection Time: 03/10/18 10:05 PM  Result Value Ref Range Status   MRSA by PCR NEGATIVE NEGATIVE Final    Comment:        The GeneXpert MRSA Assay (FDA approved for NASAL specimens only), is one component of  a comprehensive MRSA colonization surveillance program. It is not intended to diagnose MRSA infection nor to guide or monitor treatment for MRSA infections. Performed at [facility], [place] [address], [place], [place] [address]       Studies:  Ct Biopsy  Result Date: 03/17/2018 INDICATION: Recent diagnosis of lymphoma. Please perform CT-guided bone marrow biopsy for tissue diagnostic purposes. EXAM: CT-GUIDED BONE MARROW BIOPSY AND ASPIRATION MEDICATIONS: None ANESTHESIA/SEDATION: Fentanyl 100 mcg IV; Versed 2 mg IV Sedation Time: 10 Minutes; The patient was continuously monitored during the procedure by the interventional radiology nurse under my direct supervision. COMPLICATIONS: None immediate. PROCEDURE: Informed consent was obtained from the patient following an explanation of the procedure, risks, benefits and alternatives. The patient understands, agrees and consents for the procedure. All questions were addressed. A time out was performed prior to the initiation of the procedure. The patient was positioned left lateral decubitus and non-contrast localization CT was performed of the pelvis to demonstrate the iliac marrow spaces. The operative site was prepped and draped in the usual sterile fashion. Under sterile conditions and local anesthesia, a 22 gauge spinal needle was utilized for procedural planning. Next, an 11 gauge coaxial bone biopsy needle was advanced into the left iliac marrow space. Needle position was confirmed with CT imaging. Initially, bone marrow aspiration was performed. Next, a bone marrow biopsy was obtained with the 11 gauge outer bone marrow device. Samples were prepared with the cytotechnologist and deemed adequate. The needle was removed intact. Hemostasis was obtained with compression and a dressing was placed. The patient tolerated the procedure well without immediate post procedural complication. IMPRESSION: Successful CT guided left iliac bone  marrow aspiration and core biopsy. Electronically Signed   By: Sandi Mariscal M.D.   On: 03/17/2018 10:41   Ct Bone Marrow Biopsy  Result Date: 03/17/2018 INDICATION: Recent diagnosis of lymphoma. Please perform CT-guided bone marrow biopsy for tissue diagnostic purposes. EXAM: CT-GUIDED BONE MARROW BIOPSY AND ASPIRATION MEDICATIONS: None ANESTHESIA/SEDATION: Fentanyl 100 mcg IV; Versed 2 mg IV Sedation Time: 10 Minutes; The patient was continuously monitored during the procedure by the interventional radiology nurse under my direct supervision. COMPLICATIONS: None immediate. PROCEDURE: Informed consent was obtained from the patient following an  explanation of the procedure, risks, benefits and alternatives. The patient understands, agrees and consents for the procedure. All questions were addressed. A time out was performed prior to the initiation of the procedure. The patient was positioned left lateral decubitus and non-contrast localization CT was performed of the pelvis to demonstrate the iliac marrow spaces. The operative site was prepped and draped in the usual sterile fashion. Under sterile conditions and local anesthesia, a 22 gauge spinal needle was utilized for procedural planning. Next, an 11 gauge coaxial bone biopsy needle was advanced into the left iliac marrow space. Needle position was confirmed with CT imaging. Initially, bone marrow aspiration was performed. Next, a bone marrow biopsy was obtained with the 11 gauge outer bone marrow device. Samples were prepared with the cytotechnologist and deemed adequate. The needle was removed intact. Hemostasis was obtained with compression and a dressing was placed. The patient tolerated the procedure well without immediate post procedural complication. IMPRESSION: Successful CT guided left iliac bone marrow aspiration and core biopsy. Electronically Signed   By: Sandi Mariscal M.D.   On: 03/17/2018 10:41   Ir Imaging Guided Port Insertion  Result Date:  03/17/2018 INDICATION: Recent diagnosis of lymphoma. In need of durable intravenous access for chemotherapy administration. EXAM: IMPLANTED PORT A CATH PLACEMENT WITH ULTRASOUND AND FLUOROSCOPIC GUIDANCE COMPARISON:  CT the chest, abdomen and pelvis-03/10/2018 MEDICATIONS: Ancef 2 gm IV; The antibiotic was administered within an appropriate time interval prior to skin puncture. ANESTHESIA/SEDATION: Moderate (conscious) sedation was employed during this procedure. A total of Versed 0.5 mg and Fentanyl 25 mcg was administered intravenously. Moderate Sedation Time: 26 minutes. The patient's level of consciousness and vital signs were monitored continuously by radiology nursing throughout the procedure under my direct supervision. CONTRAST:  None FLUOROSCOPY TIME:  12 seconds (5 mGy) COMPLICATIONS: None immediate. PROCEDURE: The procedure, risks, benefits, and alternatives were explained to the patient. Questions regarding the procedure were encouraged and answered. The patient understands and consents to the procedure. The right neck and chest were prepped with chlorhexidine in a sterile fashion, and a sterile drape was applied covering the operative field. Maximum barrier sterile technique with sterile gowns and gloves were used for the procedure. A timeout was performed prior to the initiation of the procedure. Local anesthesia was provided with 1% lidocaine with epinephrine. After creating a small venotomy incision, a micropuncture kit was utilized to access the internal jugular vein. Real-time ultrasound guidance was utilized for vascular access including the acquisition of a permanent ultrasound image documenting patency of the accessed vessel. The microwire was utilized to measure appropriate catheter length. A subcutaneous port pocket was then created along the upper chest wall utilizing a combination of sharp and blunt dissection. The pocket was irrigated with sterile saline. A single lumen ISP power injectable  port was chosen for placement. The 8 Fr catheter was tunneled from the port pocket site to the venotomy incision. The port was placed in the pocket. The external catheter was trimmed to appropriate length. At the venotomy, an 8 Fr peel-away sheath was placed over a guidewire under fluoroscopic guidance. The catheter was then placed through the sheath and the sheath was removed. Final catheter positioning was confirmed and documented with a fluoroscopic spot radiograph. The port was accessed with a Huber needle and was noted to easily aspirate and flush. The Port a Catheter was left accessed for the initiation of chemotherapy later today. The venotomy site was closed with an interrupted 4-0 Vicryl suture. The port pocket incision  was closed with interrupted 2-0 Vicryl suture and the skin was opposed with a running subcuticular 4-0 Vicryl suture. Dermabond and Steri-strips were applied to both incisions. Dressings were placed. The patient tolerated the procedure well without immediate post procedural complication. FINDINGS: After catheter placement, the tip lies within the superior cavoatrial junction. The catheter aspirates and flushes normally and is ready for immediate use. IMPRESSION: Successful placement of a right internal jugular approach power injectable Port-A-Cath. The catheter is ready for immediate use. Electronically Signed   By: Sandi Mariscal M.D.   On: 03/17/2018 10:42    Assessment: 62 y.o. 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, being treated with [facility] chemotherapy, currently day 2  (1) EPOCH: treatment consists of doxorubicin (=hydroxycloroquin), vincristine (=Oncovin), and etoposide given daily x 4, followed by cytoxan day 5; prednisone orally; cycles repeated every 21 days, with curative intent  (a) intrathecal methotrexate and rituximab IV planned for next week as outpatient  (2) acute Right lower extremity ab  bilateral PE's documented 03/10/2018  (a) on heparin starting 06/28, interrupted for procedures and stopped 07/05 after GIB episode requiring transfusion  (b) will start unfractionated hepatin at prophylactic dosing today  (3) GIB: Hb stable past 24 h    Plan:  He is tolerating chemotherapy well and we are proceeding with day 2 as ordered.  Uric acid rising-- will start allopurinol. Follow in AM  Difficult to manage PE in setting of GIB-- in general clots is more dangerous than bleeding. For now will start UFH at 5K units sQ BID and follow; it is favorable that he has had no BM since yesterday PM (and not on narcotics).  Hopefully as abdominal mass shrinks hydronephrosis and RLE swelling will improve.  Encouraged him to get OOBTC as much as possible during the day  Continue to follow Hb as you are doing and transfuse again if <7.0 or symptomatic  Will follow with you  Chauncey Cruel, MD 03/18/2018  9:41 AM Medical Oncology and Hematology [facility] [place], [place] [address] Tel. [phone]    Fax. [phone]

## 2018-03-18 NOTE — Progress Notes (Signed)
PROGRESS NOTE    Mark Frey  ZOX:[id number] DOB: 01/08/1956 DOA: 03/10/2018 PCP: [name], MD    Brief Narrative:  Mark Frey is a 62 y.o. male with a history of diabetes mellitus that presented secondary to DVT found on ultrasound. He was also found to have a massive bilateral PE in addition to concern for abdominal malignancy on CT scan. Started on heparin IV.  Assessment & Plan:   Active Problems:   Hyperlipidemia   Controlled diabetes mellitus type II without complication (HCC)   Hypertension   Bilateral pulmonary embolism (HCC)   Anemia   Hypoglycemia   Occult blood in stools   Abdominal mass   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HCC)   Acute deep vein thrombosis (DVT) of femoral vein of right lower extremity (HCC)  Bilateral pulmonary embolism -Noted to be Involving lobar, segmental and subsegmental branches bilaterally with evidence of heart strain.  -Patient determined to not be a tPA candidate per PCCM.  -Patient was continued on heparin gtt, however complications involving transfusion dependent anemia from lower GI bleed were noted -Heparin stopped last night following episode of GI bleed and requiring one unit of PRBC -Appreciate Oncology input. Recommendation for UFH at 5K units subQ BID -Recommendation to transfuse if hgb <7.0 -Transthoracic Echocardiogram not significant for heart strain with normal LVEF -Remains on minimal O2 support  DVT -Right saphenofemoral junction, right common femoral vein, posterior tibial/peroneal veins. -Heparin has since been stopped in leu of UFH at 5K units subQ bid per above given recurrent GI bleed  Intraabdominal mass -Large mass noted on CT abd personally reviewed -CT chest without evidence of metastatic disease -Patient is status post EGD and colonoscopy.   -Upper endoscopy unremarkable.   -Colonoscopy notable for obstructing mass  -Pathology results notable for diffuse B cell lymphoma.  -Appreciate input by  Oncology. Recommendation for chemo with intent on cure with port placed on 7/5 -Tolerating chemo thus far  Elevated blood pressure -No history of hypertension noted -Patient is continued on PRN hydralazine -BP stable at present  Diabetes mellitus, type 2 -Well controlled.  -Hemoglobin A1C of 5.4%. currently continued on Janumet and glimepiride as an outpatient. -Continue patitent on SSI coverage while admitted  Heme positive stool Normocytic anemia -Colonoscopy findings noted as per above -Fereheme given 7/4 x 1 dose -Thus far, patient has required total of 3 units PRBCs for lower GI bleed -Now on UFH per above -recheck CBC in AM  Obesity -Body mass index is 38.4 kg/m. -Stable  Scrotal swelling -Scrotal US reviewed. Scrotal swelling noted -Arterial and venous doppler with good flow.  -Given one dose of lasix, however now stopped given ARF below -Stable at present  ARF -Cr peaked to 1.6 on 7/5 following lasix -Lasix has been discontinued -Renal function has improved with gentle fluids overnight -Recheck CBC in AM -Avoid nephrotoxic agents if possible  DVT prophylaxis: Heparin drip Code Status: Full code Family Communication: Patient in room, family at bedside Disposition Plan: Uncertain at this time  Consultants:   Oncology  Gastroenterology  General surgery  Pulmonary  Procedures:   EGD/colonoscopy on 03/15/2018  Antimicrobials: Anti-infectives (From admission, onward)   Start     Dose/Rate Route Frequency Ordered Stop   03/17/18 1000  ceFAZolin (ANCEF) IVPB 2g/100 mL premix  Status:  Discontinued     2 g 200 mL/hr over 30 Minutes Intravenous To Radiology 03/16/18 1132 03/17/18 1010      Subjective: No chest pain, sob, abd pain  Objective: Vitals:   03/18/18 0230 03/18/18 0345 03/18/18 0845 03/18/18 1353  BP:  124/64  122/80  Pulse:  93  100  Resp:  16  16  Temp:  98.4 F (36.9 C)  98.3 F (36.8 C)  TempSrc:  Tympanic  Oral  SpO2:  95%   96%  Weight: 90.9 kg (200 lb 6.4 oz)  122.2 kg (269 lb 8 oz)   Height:        Intake/Output Summary (Last 24 hours) at 03/18/2018 1509 Last data filed at 03/18/2018 1416 Gross per 24 hour  Intake 691.1 ml  Output 2700 ml  Net -2008.9 ml   Filed Weights   03/15/18 1155 03/18/18 0230 03/18/18 0845  Weight: 117.9 kg (260 lb) 90.9 kg (200 lb 6.4 oz) 122.2 kg (269 lb 8 oz)    Examination: General exam: Awake, laying in bed, in nad Respiratory system: Normal respiratory effort, no wheezing Cardiovascular system: regular rate, s1, s2 Gastrointestinal system: Soft, nondistended, positive BS Central nervous system: CN2-12 grossly intact, strength intact Extremities: Perfused, no clubbing, RLE edema Skin: Normal skin turgor, no notable skin lesions seen Psychiatry: Mood normal // no visual hallucinations   Data Reviewed: I have personally reviewed following labs and imaging studies  CBC: Recent Labs  Lab 03/13/18 0526 03/14/18 0508 03/15/18 0457 03/16/18 0501  03/16/18 1851 03/17/18 0618 03/17/18 1630 03/17/18 1944 03/18/18 0653  WBC 8.5 8.5 7.8 9.0  --   --  12.3*  --   --   --   NEUTROABS  --   --   --   --   --   --  9.9*  --   --   --   HGB 7.7* 7.9* 7.5* 7.3*   < > 6.2* 8.4* 7.6* 7.3* 7.9*  HCT 24.8* 26.2* 25.1* 23.9*   < > 20.4* 25.7* 23.0* 22.3* 24.6*  MCV 85.5 86.5 87.2 85.4  --   --  86.5  --   --   --   PLT 505* 442* 475* 500*  --   --  414*  --   --   --    < > = values in this interval not displayed.   Basic Metabolic Panel: Recent Labs  Lab 03/15/18 2113 03/17/18 0618 03/18/18 0653  NA  --  136 136  K  --  4.4 4.9  CL  --  103 104  CO2  --  21* 22  GLUCOSE  --  148* 168*  BUN  --  33* 31*  CREATININE  --  1.66* 1.10  CALCIUM  --  8.1* 8.2*  MG 2.0  --  2.4  PHOS 1.9*  --   --    GFR: Estimated Creatinine Clearance: 91.1 mL/min (by C-G formula based on SCr of 1.1 mg/dL). Liver Function Tests: Recent Labs  Lab 03/17/18 0618 03/18/18 0653  AST 84*  37  ALT 51* 38  ALKPHOS 36* 39  BILITOT 0.9 0.8  PROT 6.1* 6.0*  ALBUMIN 2.7* 2.6*   No results for input(s): LIPASE, AMYLASE in the last 168 hours. No results for input(s): AMMONIA in the last 168 hours. Coagulation Profile: Recent Labs  Lab 03/13/18 0526 03/17/18 0618  INR 1.13 1.22   Cardiac Enzymes: No results for input(s): CKTOTAL, CKMB, CKMBINDEX, TROPONINI in the last 168 hours. BNP (last 3 results) No results for input(s): PROBNP in the last 8760 hours. HbA1C: No results for input(s): HGBA1C in the last 72 hours. CBG: Recent Labs  Lab  25-Mar-2018 1220 25-Mar-2018 1649 03-25-2018 2132 03/18/18 0759 03/18/18 1132  GLUCAP 194* 153* 203* 161* 195*   Lipid Profile: No results for input(s): CHOL, HDL, LDLCALC, TRIG, CHOLHDL, LDLDIRECT in the last 72 hours. Thyroid Function Tests: No results for input(s): TSH, T4TOTAL, FREET4, T3FREE, THYROIDAB in the last 72 hours. Anemia Panel: Recent Labs    03/15/18 2113  RETICCTPCT 4.4*   Sepsis Labs: No results for input(s): PROCALCITON, LATICACIDVEN in the last 168 hours.  Recent Results (from the past 240 hour(s))  MRSA PCR Screening     Status: None   Collection Time: 03/10/18 10:05 PM  Result Value Ref Range Status   MRSA by PCR NEGATIVE NEGATIVE Final    Comment:        The GeneXpert MRSA Assay (FDA approved for NASAL specimens only), is one component of a comprehensive MRSA colonization surveillance program. It is not intended to diagnose MRSA infection nor to guide or monitor treatment for MRSA infections. Performed at [facility], [place] [address], [place], [place] [address]      Radiology Studies: Ct Biopsy  Result Date: 03-25-18 INDICATION: Recent diagnosis of lymphoma. Please perform CT-guided bone marrow biopsy for tissue diagnostic purposes. EXAM: CT-GUIDED BONE MARROW BIOPSY AND ASPIRATION MEDICATIONS: None ANESTHESIA/SEDATION: Fentanyl 100 mcg IV; Versed 2 mg IV Sedation Time: 10  Minutes; The patient was continuously monitored during the procedure by the interventional radiology nurse under my direct supervision. COMPLICATIONS: None immediate. PROCEDURE: Informed consent was obtained from the patient following an explanation of the procedure, risks, benefits and alternatives. The patient understands, agrees and consents for the procedure. All questions were addressed. A time out was performed prior to the initiation of the procedure. The patient was positioned left lateral decubitus and non-contrast localization CT was performed of the pelvis to demonstrate the iliac marrow spaces. The operative site was prepped and draped in the usual sterile fashion. Under sterile conditions and local anesthesia, a 22 gauge spinal needle was utilized for procedural planning. Next, an 11 gauge coaxial bone biopsy needle was advanced into the left iliac marrow space. Needle position was confirmed with CT imaging. Initially, bone marrow aspiration was performed. Next, a bone marrow biopsy was obtained with the 11 gauge outer bone marrow device. Samples were prepared with the cytotechnologist and deemed adequate. The needle was removed intact. Hemostasis was obtained with compression and a dressing was placed. The patient tolerated the procedure well without immediate post procedural complication. IMPRESSION: Successful CT guided left iliac bone marrow aspiration and core biopsy. Electronically Signed   By: Sandi Mariscal M.D.   On: 2018-03-25 10:41   Ct Bone Marrow Biopsy  Result Date: 03/25/18 INDICATION: Recent diagnosis of lymphoma. Please perform CT-guided bone marrow biopsy for tissue diagnostic purposes. EXAM: CT-GUIDED BONE MARROW BIOPSY AND ASPIRATION MEDICATIONS: None ANESTHESIA/SEDATION: Fentanyl 100 mcg IV; Versed 2 mg IV Sedation Time: 10 Minutes; The patient was continuously monitored during the procedure by the interventional radiology nurse under my direct supervision. COMPLICATIONS: None  immediate. PROCEDURE: Informed consent was obtained from the patient following an explanation of the procedure, risks, benefits and alternatives. The patient understands, agrees and consents for the procedure. All questions were addressed. A time out was performed prior to the initiation of the procedure. The patient was positioned left lateral decubitus and non-contrast localization CT was performed of the pelvis to demonstrate the iliac marrow spaces. The operative site was prepped and draped in the usual sterile fashion. Under sterile conditions and local anesthesia,  a 22 gauge spinal needle was utilized for procedural planning. Next, an 11 gauge coaxial bone biopsy needle was advanced into the left iliac marrow space. Needle position was confirmed with CT imaging. Initially, bone marrow aspiration was performed. Next, a bone marrow biopsy was obtained with the 11 gauge outer bone marrow device. Samples were prepared with the cytotechnologist and deemed adequate. The needle was removed intact. Hemostasis was obtained with compression and a dressing was placed. The patient tolerated the procedure well without immediate post procedural complication. IMPRESSION: Successful CT guided left iliac bone marrow aspiration and core biopsy. Electronically Signed   By: Sandi Mariscal M.D.   On: 03/17/2018 10:41   Ir Imaging Guided Port Insertion  Result Date: 03/17/2018 INDICATION: Recent diagnosis of lymphoma. In need of durable intravenous access for chemotherapy administration. EXAM: IMPLANTED PORT A CATH PLACEMENT WITH ULTRASOUND AND FLUOROSCOPIC GUIDANCE COMPARISON:  CT the chest, abdomen and pelvis-03/10/2018 MEDICATIONS: Ancef 2 gm IV; The antibiotic was administered within an appropriate time interval prior to skin puncture. ANESTHESIA/SEDATION: Moderate (conscious) sedation was employed during this procedure. A total of Versed 0.5 mg and Fentanyl 25 mcg was administered intravenously. Moderate Sedation Time: 26  minutes. The patient's level of consciousness and vital signs were monitored continuously by radiology nursing throughout the procedure under my direct supervision. CONTRAST:  None FLUOROSCOPY TIME:  12 seconds (5 mGy) COMPLICATIONS: None immediate. PROCEDURE: The procedure, risks, benefits, and alternatives were explained to the patient. Questions regarding the procedure were encouraged and answered. The patient understands and consents to the procedure. The right neck and chest were prepped with chlorhexidine in a sterile fashion, and a sterile drape was applied covering the operative field. Maximum barrier sterile technique with sterile gowns and gloves were used for the procedure. A timeout was performed prior to the initiation of the procedure. Local anesthesia was provided with 1% lidocaine with epinephrine. After creating a small venotomy incision, a micropuncture kit was utilized to access the internal jugular vein. Real-time ultrasound guidance was utilized for vascular access including the acquisition of a permanent ultrasound image documenting patency of the accessed vessel. The microwire was utilized to measure appropriate catheter length. A subcutaneous port pocket was then created along the upper chest wall utilizing a combination of sharp and blunt dissection. The pocket was irrigated with sterile saline. A single lumen ISP power injectable port was chosen for placement. The 8 Fr catheter was tunneled from the port pocket site to the venotomy incision. The port was placed in the pocket. The external catheter was trimmed to appropriate length. At the venotomy, an 8 Fr peel-away sheath was placed over a guidewire under fluoroscopic guidance. The catheter was then placed through the sheath and the sheath was removed. Final catheter positioning was confirmed and documented with a fluoroscopic spot radiograph. The port was accessed with a Huber needle and was noted to easily aspirate and flush. The Port a  Catheter was left accessed for the initiation of chemotherapy later today. The venotomy site was closed with an interrupted 4-0 Vicryl suture. The port pocket incision was closed with interrupted 2-0 Vicryl suture and the skin was opposed with a running subcuticular 4-0 Vicryl suture. Dermabond and Steri-strips were applied to both incisions. Dressings were placed. The patient tolerated the procedure well without immediate post procedural complication. FINDINGS: After catheter placement, the tip lies within the superior cavoatrial junction. The catheter aspirates and flushes normally and is ready for immediate use. IMPRESSION: Successful placement of a right internal jugular  approach power injectable Port-A-Cath. The catheter is ready for immediate use. Electronically Signed   By: Sandi Mariscal M.D.   On: 03/17/2018 10:42    Scheduled Meds: . allopurinol  300 mg Oral Daily  . docusate sodium  100 mg Oral BID  . DOXOrubicin/vinCRIStine/etoposide CHEMO IV infusion for Inpatient CI   Intravenous Once  . fenofibrate  160 mg Oral Daily  . heparin  5,000 Units Subcutaneous BID  . insulin aspart  0-9 Units Subcutaneous TID WC  . predniSONE  30 mg Oral Q breakfast   Continuous Infusions: . sodium chloride Stopped (03/18/18 0102)     LOS: 8 days   Marylu Lund, MD [facility] Pager [phone]  If 7PM-7AM, please contact night-coverage [email or web address] Password [email or web address] 03/18/2018, 3:09 PM

## 2018-03-19 LAB — BASIC METABOLIC PANEL
Anion gap: 7 (ref 5–15)
BUN: 36 mg/dL — AB (ref 8–23)
CALCIUM: 8.1 mg/dL — AB (ref 8.9–10.3)
CO2: 23 mmol/L (ref 22–32)
CREATININE: 0.98 mg/dL (ref 0.61–1.24)
Chloride: 108 mmol/L (ref 98–111)
GFR calc Af Amer: >60 mL/min (ref >60)
GLUCOSE: 187 mg/dL — AB (ref 70–99)
Potassium: 4.8 mmol/L (ref 3.5–5.1)
SODIUM: 138 mmol/L (ref 135–145)

## 2018-03-19 LAB — GLUCOSE, CAPILLARY
GLUCOSE-CAPILLARY: 165 mg/dL — AB (ref 70–99)
GLUCOSE-CAPILLARY: 264 mg/dL — AB (ref 70–99)
GLUCOSE-CAPILLARY: 256 mg/dL — AB (ref 70–99)
Glucose-Capillary: 143 mg/dL — ABNORMAL HIGH (ref 70–99)

## 2018-03-19 LAB — HEMOGLOBIN AND HEMATOCRIT, BLOOD
HEMATOCRIT: 23.7 % — AB (ref 39.0–52.0)
Hemoglobin: 7.6 g/dL — ABNORMAL LOW (ref 13.0–17.0)

## 2018-03-19 LAB — CBC
HEMATOCRIT: 22.9 % — AB (ref 39.0–52.0)
Hemoglobin: 7.3 g/dL — ABNORMAL LOW (ref 13.0–17.0)
MCH: 27.3 pg (ref 26.0–34.0)
MCHC: 31.9 g/dL (ref 30.0–36.0)
MCV: 85.8 fL (ref 78.0–100.0)
PLATELETS: 346 10*3/uL (ref 150–400)
RBC: 2.67 MIL/uL — ABNORMAL LOW (ref 4.22–5.81)
RDW: 18.7 % — AB (ref 11.5–15.5)
WBC: 11 10*3/uL — ABNORMAL HIGH (ref 4.0–10.5)

## 2018-03-19 LAB — MAGNESIUM: MAGNESIUM: 2.4 mg/dL (ref 1.7–2.4)

## 2018-03-19 LAB — HEPATITIS B CORE ANTIBODY, TOTAL: Hep B Core Total Ab: NEGATIVE

## 2018-03-19 LAB — HEPATITIS B SURFACE ANTIGEN: Hepatitis B Surface Ag: NEGATIVE

## 2018-03-19 LAB — LACTATE DEHYDROGENASE: LDH: 419 U/L — AB (ref 98–192)

## 2018-03-19 LAB — URIC ACID: Uric Acid, Serum: 8.9 mg/dL — ABNORMAL HIGH (ref 3.7–8.6)

## 2018-03-19 MED ORDER — SODIUM CHLORIDE 0.9 % IV SOLN
Freq: Once | INTRAVENOUS | Status: AC
  Administered 2018-03-19: 8 mg via INTRAVENOUS
  Filled 2018-03-19: qty 4

## 2018-03-19 MED ORDER — VINCRISTINE SULFATE CHEMO INJECTION 1 MG/ML
Freq: Once | INTRAVENOUS | Status: AC
  Administered 2018-03-19: 13:00:00 via INTRAVENOUS
  Filled 2018-03-19: qty 12

## 2018-03-19 MED ORDER — ZOLPIDEM TARTRATE 5 MG PO TABS: 5.0000 mg | ORAL_TABLET | Freq: Every evening | ORAL | Status: DC | PRN

## 2018-03-19 NOTE — Progress Notes (Signed)
Mark Frey   DOB:1956-01-04   OZ#:[id number]   QQV#:[id number]  Subjective:  Did very well with chemo yeterday-- no nausea or vomiting, lines working well, was OOBTC from about 9 A to about 5P, not walking "because of the leg," no BM past 36 hours, making more urine; thinks R leg is "less swollen;" no bleeding; no family in room   Objective: middle aged White man examined in bed Vitals:   03/18/18 2116 03/19/18 0543  BP: 131/75 128/75  Pulse: (!) 102 89  Resp: 17 16  Temp: 98 F (36.7 C) 97.9 F (36.6 C)  SpO2: 97% 100%    Body mass index is 39.8 kg/m.  Intake/Output Summary (Last 24 hours) at 03/19/2018 0843 Last data filed at 03/19/2018 0752 Gross per 24 hour  Intake 1320 ml  Output 2100 ml  Net -780 ml   Sclerae unicteric, pupils round and equal Oropharynx clear and moist Lungs no rales or rhonchi--auscultated anterolaterally Heart regular rate and rhythm Abd soft, obese, nontender, positive bowel sounds MSK RLE edema as noted, no erythema Neuro: nonfocal, well oriented, appropriate affect     CBG (last 3)  Recent Labs    03/18/18 0759 03/18/18 1132 03/19/18 0826  GLUCAP 161* 195* 165*     Labs:  Lab Results  Component Value Date   WBC 11.0 (H) 03/19/2018   HGB 7.3 (L) 03/19/2018   HCT 22.9 (L) 03/19/2018   MCV 85.8 03/19/2018   PLT 346 03/19/2018   NEUTROABS 9.9 (H) 03/17/2018    @LASTCHEMISTRY @  Urine Studies No results for input(s): UHGB, CRYS in the last 72 hours.  Invalid input(s): UACOL, UAPR, USPG, UPH, UTP, UGL, UKET, UBIL, UNIT, UROB, ULEU, UEPI, UWBC, URBC, UBAC, CAST, [place], [place]  Basic Metabolic Panel: Recent Labs  Lab 03/15/18 2113  03/17/18 0618 03/18/18 0653 03/19/18 0501  NA  --   --  136 136 138  K  --    < > 4.4 4.9 4.8  CL  --   --  103 104 108  CO2  --   --  21* 22 23  GLUCOSE  --   --  148* 168* 187*  BUN  --   --  33* 31* 36*  CREATININE  --   --  1.66* 1.10 0.98  CALCIUM  --   --  8.1* 8.2* 8.1*  MG 2.0  --   --   2.4 2.4  PHOS 1.9*  --   --   --   --    < > = values in this interval not displayed.   GFR Estimated Creatinine Clearance: 102.2 mL/min (by C-G formula based on SCr of 0.98 mg/dL). Liver Function Tests: Recent Labs  Lab 03/17/18 0618 03/18/18 0653  AST 84* 37  ALT 51* 38  ALKPHOS 36* 39  BILITOT 0.9 0.8  PROT 6.1* 6.0*  ALBUMIN 2.7* 2.6*   No results for input(s): LIPASE, AMYLASE in the last 168 hours. No results for input(s): AMMONIA in the last 168 hours. Coagulation profile Recent Labs  Lab 03/13/18 0526 03/17/18 0618  INR 1.13 1.22    CBC: Recent Labs  Lab 03/14/18 0508 03/15/18 0457 03/16/18 0501  03/17/18 0618 03/17/18 1630 03/17/18 1944 03/18/18 0653 03/19/18 0501  WBC 8.5 7.8 9.0  --  12.3*  --   --   --  11.0*  NEUTROABS  --   --   --   --  9.9*  --   --   --   --  HGB 7.9* 7.5* 7.3*   < > 8.4* 7.6* 7.3* 7.9* 7.3*  HCT 26.2* 25.1* 23.9*   < > 25.7* 23.0* 22.3* 24.6* 22.9*  MCV 86.5 87.2 85.4  --  86.5  --   --   --  85.8  PLT 442* 475* 500*  --  414*  --   --   --  346   < > = values in this interval not displayed.   Cardiac Enzymes: No results for input(s): CKTOTAL, CKMB, CKMBINDEX, TROPONINI in the last 168 hours. BNP: Invalid input(s): POCBNP CBG: Recent Labs  Lab 03/17/18 1649 03/17/18 2132 03/18/18 0759 03/18/18 1132 03/19/18 0826  GLUCAP 153* 203* 161* 195* 165*   D-Dimer No results for input(s): DDIMER in the last 72 hours. Hgb A1c No results for input(s): HGBA1C in the last 72 hours. Lipid Profile No results for input(s): CHOL, HDL, LDLCALC, TRIG, CHOLHDL, LDLDIRECT in the last 72 hours. Thyroid function studies No results for input(s): TSH, T4TOTAL, T3FREE, THYROIDAB in the last 72 hours.  Invalid input(s): FREET3 Anemia work up No results for input(s): VITAMINB12, FOLATE, FERRITIN, TIBC, IRON, RETICCTPCT in the last 72 hours. Microbiology Recent Results (from the past 240 hour(s))  MRSA PCR Screening     Status: None    Collection Time: 03/10/18 10:05 PM  Result Value Ref Range Status   MRSA by PCR NEGATIVE NEGATIVE Final    Comment:        The GeneXpert MRSA Assay (FDA approved for NASAL specimens only), is one component of a comprehensive MRSA colonization surveillance program. It is not intended to diagnose MRSA infection nor to guide or monitor treatment for MRSA infections. Performed at [facility], [place] [address] [place], [place] [address]       Studies:  Ct Biopsy  Result Date: 03/17/2018 INDICATION: Recent diagnosis of lymphoma. Please perform CT-guided bone marrow biopsy for tissue diagnostic purposes. EXAM: CT-GUIDED BONE MARROW BIOPSY AND ASPIRATION MEDICATIONS: None ANESTHESIA/SEDATION: Fentanyl 100 mcg IV; Versed 2 mg IV Sedation Time: 10 Minutes; The patient was continuously monitored during the procedure by the interventional radiology nurse under my direct supervision. COMPLICATIONS: None immediate. PROCEDURE: Informed consent was obtained from the patient following an explanation of the procedure, risks, benefits and alternatives. The patient understands, agrees and consents for the procedure. All questions were addressed. A time out was performed prior to the initiation of the procedure. The patient was positioned left lateral decubitus and non-contrast localization CT was performed of the pelvis to demonstrate the iliac marrow spaces. The operative site was prepped and draped in the usual sterile fashion. Under sterile conditions and local anesthesia, a 22 gauge spinal needle was utilized for procedural planning. Next, an 11 gauge coaxial bone biopsy needle was advanced into the left iliac marrow space. Needle position was confirmed with CT imaging. Initially, bone marrow aspiration was performed. Next, a bone marrow biopsy was obtained with the 11 gauge outer bone marrow device. Samples were prepared with the cytotechnologist and deemed adequate. The needle was removed  intact. Hemostasis was obtained with compression and a dressing was placed. The patient tolerated the procedure well without immediate post procedural complication. IMPRESSION: Successful CT guided left iliac bone marrow aspiration and core biopsy. Electronically Signed   By: Sandi Mariscal M.D.   On: 03/17/2018 10:41   Ct Bone Marrow Biopsy  Result Date: 03/17/2018 INDICATION: Recent diagnosis of lymphoma. Please perform CT-guided bone marrow biopsy for tissue diagnostic purposes. EXAM: CT-GUIDED BONE MARROW BIOPSY  AND ASPIRATION MEDICATIONS: None ANESTHESIA/SEDATION: Fentanyl 100 mcg IV; Versed 2 mg IV Sedation Time: 10 Minutes; The patient was continuously monitored during the procedure by the interventional radiology nurse under my direct supervision. COMPLICATIONS: None immediate. PROCEDURE: Informed consent was obtained from the patient following an explanation of the procedure, risks, benefits and alternatives. The patient understands, agrees and consents for the procedure. All questions were addressed. A time out was performed prior to the initiation of the procedure. The patient was positioned left lateral decubitus and non-contrast localization CT was performed of the pelvis to demonstrate the iliac marrow spaces. The operative site was prepped and draped in the usual sterile fashion. Under sterile conditions and local anesthesia, a 22 gauge spinal needle was utilized for procedural planning. Next, an 11 gauge coaxial bone biopsy needle was advanced into the left iliac marrow space. Needle position was confirmed with CT imaging. Initially, bone marrow aspiration was performed. Next, a bone marrow biopsy was obtained with the 11 gauge outer bone marrow device. Samples were prepared with the cytotechnologist and deemed adequate. The needle was removed intact. Hemostasis was obtained with compression and a dressing was placed. The patient tolerated the procedure well without immediate post procedural  complication. IMPRESSION: Successful CT guided left iliac bone marrow aspiration and core biopsy. Electronically Signed   By: Sandi Mariscal M.D.   On: 03/17/2018 10:41   Ir Imaging Guided Port Insertion  Result Date: 03/17/2018 INDICATION: Recent diagnosis of lymphoma. In need of durable intravenous access for chemotherapy administration. EXAM: IMPLANTED PORT A CATH PLACEMENT WITH ULTRASOUND AND FLUOROSCOPIC GUIDANCE COMPARISON:  CT the chest, abdomen and pelvis-03/10/2018 MEDICATIONS: Ancef 2 gm IV; The antibiotic was administered within an appropriate time interval prior to skin puncture. ANESTHESIA/SEDATION: Moderate (conscious) sedation was employed during this procedure. A total of Versed 0.5 mg and Fentanyl 25 mcg was administered intravenously. Moderate Sedation Time: 26 minutes. The patient's level of consciousness and vital signs were monitored continuously by radiology nursing throughout the procedure under my direct supervision. CONTRAST:  None FLUOROSCOPY TIME:  12 seconds (5 mGy) COMPLICATIONS: None immediate. PROCEDURE: The procedure, risks, benefits, and alternatives were explained to the patient. Questions regarding the procedure were encouraged and answered. The patient understands and consents to the procedure. The right neck and chest were prepped with chlorhexidine in a sterile fashion, and a sterile drape was applied covering the operative field. Maximum barrier sterile technique with sterile gowns and gloves were used for the procedure. A timeout was performed prior to the initiation of the procedure. Local anesthesia was provided with 1% lidocaine with epinephrine. After creating a small venotomy incision, a micropuncture kit was utilized to access the internal jugular vein. Real-time ultrasound guidance was utilized for vascular access including the acquisition of a permanent ultrasound image documenting patency of the accessed vessel. The microwire was utilized to measure appropriate  catheter length. A subcutaneous port pocket was then created along the upper chest wall utilizing a combination of sharp and blunt dissection. The pocket was irrigated with sterile saline. A single lumen ISP power injectable port was chosen for placement. The 8 Fr catheter was tunneled from the port pocket site to the venotomy incision. The port was placed in the pocket. The external catheter was trimmed to appropriate length. At the venotomy, an 8 Fr peel-away sheath was placed over a guidewire under fluoroscopic guidance. The catheter was then placed through the sheath and the sheath was removed. Final catheter positioning was confirmed and documented with a fluoroscopic  spot radiograph. The port was accessed with a Huber needle and was noted to easily aspirate and flush. The Port a Catheter was left accessed for the initiation of chemotherapy later today. The venotomy site was closed with an interrupted 4-0 Vicryl suture. The port pocket incision was closed with interrupted 2-0 Vicryl suture and the skin was opposed with a running subcuticular 4-0 Vicryl suture. Dermabond and Steri-strips were applied to both incisions. Dressings were placed. The patient tolerated the procedure well without immediate post procedural complication. FINDINGS: After catheter placement, the tip lies within the superior cavoatrial junction. The catheter aspirates and flushes normally and is ready for immediate use. IMPRESSION: Successful placement of a right internal jugular approach power injectable Port-A-Cath. The catheter is ready for immediate use. Electronically Signed   By: Sandi Mariscal M.D.   On: 03/17/2018 10:42    Assessment: 62 y.o. 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, being treated with [facility] chemotherapy, currently day 3  (1) EPOCH: treatment consists of doxorubicin (=hydroxycloroquin), vincristine (=Oncovin), and etoposide given daily x  4, followed by cytoxan day 5; prednisone orally; cycles repeated every 21 days, with curative intent  (a) intrathecal methotrexate and rituximab IV planned for next week as outpatient  (2) acute Right lower extremity ab bilateral PE's documented 03/10/2018  (a) on heparin starting 06/28, interrupted for procedures and stopped 07/05 after GIB episode requiring transfusion  (b) started unfractionated hepatin sQ BID 03/18/2018 (prophylactic dosing)   (3) GIB: Hb stable past 48 h, no BM past 36 h    Plan:  He is tolerating chemotherapy w/o event and we are proceeding with day 3 of 5 today.  Heparin started last PM; no evidence of bleeding at present; if tolerated well would consider increasing to TID tomorrow.  Uric acid coming down on allopurinol.  Please let us know if we can be of further help. Dr Burr Medico will resume care tomorrow.  Chauncey Cruel, MD 03/19/2018  8:43 AM Medical Oncology and Hematology [facility] [address] [place], [place] [address] Tel. [phone]    Fax. [phone]

## 2018-03-19 NOTE — Progress Notes (Signed)
PROGRESS NOTE    ADAN BAEHR  JKD:[id number] DOB: 1956-07-21 DOA: 03/10/2018 PCP: [name], MD    Brief Narrative:  Mark Frey is a 62 y.o. male with a history of diabetes mellitus that presented secondary to DVT found on ultrasound. He was also found to have a massive bilateral PE in addition to concern for abdominal malignancy on CT scan. Started on heparin IV.  Assessment & Plan:   Active Problems:   Hyperlipidemia   Controlled diabetes mellitus type II without complication (HCC)   Hypertension   Bilateral pulmonary embolism (HCC)   Anemia   Hypoglycemia   Occult blood in stools   Abdominal mass   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HCC)   Acute deep vein thrombosis (DVT) of femoral vein of right lower extremity (HCC)  Bilateral pulmonary embolism -Noted to be Involving lobar, segmental and subsegmental branches bilaterally with evidence of heart strain.  -Patient determined to not be a tPA candidate per PCCM.  -Patient was continued on heparin gtt, however complications involving transfusion dependent anemia from lower GI bleed were noted -Heparin gtt has since been stopped -Appreciate Oncology input. Recommendation for UFH at 5K units subQ BID with recommendation to transfuse if hgb <7.0 -Hgb remains stable at present -No further bleeding noted. -Per Oncology, consideration to increase to TID dosing of UFH on 7/8 if pt remains stable  DVT -Right saphenofemoral junction, right common femoral vein, posterior tibial/peroneal veins. -Heparin has since been stopped in leu of UFH at 5K units subQ bid per above given recurrent GI bleed -Per Oncology, consideration for increasing to TID dosing of UFH if patient remains stable  Intraabdominal mass secondary to diffuse B cell lymphoma -Large mass noted on CT abd personally reviewed -CT chest without evidence of metastatic disease -Patient is status post EGD and colonoscopy.   -Upper endoscopy unremarkable.     -Colonoscopy notable for obstructing mass  -Pathology results notable for diffuse B cell lymphoma.  -Appreciate input by Oncology. Recommendation for chemo with intent on cure with port placed on 7/5 -Patient is tolerating chemo thus far  Elevated blood pressure -No history of hypertension noted -Patient is continued on PRN hydralazine -BP remains stable currently  Diabetes mellitus, type 2 -Well controlled.  -Hemoglobin A1C of 5.4%. currently continued on Janumet and glimepiride as an outpatient. -Will continue with SSI coverage as needed  Heme positive stool Normocytic anemia -Colonoscopy findings noted as per above -Fereheme given 7/4 x 1 dose -Thus far, patient has required total of 3 units PRBCs for lower GI bleed -Now on UFH per above -will repeat CBC in AM  Obesity -Body mass index is 38.4 kg/m. -Presently stable  Scrotal swelling -Scrotal US reviewed. Scrotal swelling noted -Arterial and venous doppler with good flow.  -Given one dose of lasix, however now stopped given ARF below -Currently stable. Voiding well  ARF -Cr peaked to 1.6 on 7/5 following lasix -Lasix has been discontinued -Renal function has improved with brief period of gentle fluids -Avoid nephrotoxic agents if possible -Will repeat bmet in AM  DVT prophylaxis: Heparin drip Code Status: Full code Family Communication: Patient in room, family at bedside Disposition Plan: Uncertain at this time  Consultants:   Oncology  Gastroenterology  General surgery  Pulmonary  Procedures:   EGD/colonoscopy on 03/15/2018  Antimicrobials: Anti-infectives (From admission, onward)   Start     Dose/Rate Route Frequency Ordered Stop   03/17/18 1000  ceFAZolin (ANCEF) IVPB 2g/100 mL premix  Status:  Discontinued  2 g 200 mL/hr over 30 Minutes Intravenous To Radiology 03/16/18 1132 03/17/18 1010      Subjective: No sob, no chest pain. Reports feeling better  Objective: Vitals:    03/18/18 1353 03/18/18 2116 03/19/18 0543 03/19/18 1325  BP: 122/80 131/75 128/75 131/61  Pulse: 100 (!) 102 89 (!) 117  Resp: 16 17 16 16   Temp: 98.3 F (36.8 C) 98 F (36.7 C) 97.9 F (36.6 C) 98.7 F (37.1 C)  TempSrc: Oral Oral Oral Oral  SpO2: 96% 97% 100% 95%  Weight:      Height:        Intake/Output Summary (Last 24 hours) at 03/19/2018 1523 Last data filed at 03/19/2018 1333 Gross per 24 hour  Intake 480 ml  Output 1600 ml  Net -1120 ml   Filed Weights   03/15/18 1155 03/18/18 0230 03/18/18 0845  Weight: 117.9 kg (260 lb) 90.9 kg (200 lb 6.4 oz) 122.2 kg (269 lb 8 oz)    Examination: General exam: Conversant, in no acute distress Respiratory system: normal chest rise, clear, no audible wheezing Cardiovascular system: regular rhythm, s1-s2 Gastrointestinal system: Nondistended, nontender, pos BS Central nervous system: No seizures, no tremors Extremities: No cyanosis, no joint deformities Skin: No rashes, no pallor Psychiatry: Affect normal // no auditory hallucinations   Data Reviewed: I have personally reviewed following labs and imaging studies  CBC: Recent Labs  Lab 03/14/18 0508 03/15/18 0457 03/16/18 0501  03/17/18 0618 03/17/18 1630 03/17/18 1944 03/18/18 0653 03/19/18 0501  WBC 8.5 7.8 9.0  --  12.3*  --   --   --  11.0*  NEUTROABS  --   --   --   --  9.9*  --   --   --   --   HGB 7.9* 7.5* 7.3*   < > 8.4* 7.6* 7.3* 7.9* 7.3*  HCT 26.2* 25.1* 23.9*   < > 25.7* 23.0* 22.3* 24.6* 22.9*  MCV 86.5 87.2 85.4  --  86.5  --   --   --  85.8  PLT 442* 475* 500*  --  414*  --   --   --  346   < > = values in this interval not displayed.   Basic Metabolic Panel: Recent Labs  Lab 03/15/18 2113 03/17/18 0618 03/18/18 0653 03/19/18 0501  NA  --  136 136 138  K  --  4.4 4.9 4.8  CL  --  103 104 108  CO2  --  21* 22 23  GLUCOSE  --  148* 168* 187*  BUN  --  33* 31* 36*  CREATININE  --  1.66* 1.10 0.98  CALCIUM  --  8.1* 8.2* 8.1*  MG 2.0  --  2.4  2.4  PHOS 1.9*  --   --   --    GFR: Estimated Creatinine Clearance: 102.2 mL/min (by C-G formula based on SCr of 0.98 mg/dL). Liver Function Tests: Recent Labs  Lab 03/17/18 0618 03/18/18 0653  AST 84* 37  ALT 51* 38  ALKPHOS 36* 39  BILITOT 0.9 0.8  PROT 6.1* 6.0*  ALBUMIN 2.7* 2.6*   No results for input(s): LIPASE, AMYLASE in the last 168 hours. No results for input(s): AMMONIA in the last 168 hours. Coagulation Profile: Recent Labs  Lab 03/13/18 0526 03/17/18 0618  INR 1.13 1.22   Cardiac Enzymes: No results for input(s): CKTOTAL, CKMB, CKMBINDEX, TROPONINI in the last 168 hours. BNP (last 3 results) No results for input(s): PROBNP in  the last 8760 hours. HbA1C: No results for input(s): HGBA1C in the last 72 hours. CBG: Recent Labs  Lab 03/17/18 2132 03/18/18 0759 03/18/18 1132 03/19/18 0826 03/19/18 1148  GLUCAP 203* 161* 195* 165* 143*   Lipid Profile: No results for input(s): CHOL, HDL, LDLCALC, TRIG, CHOLHDL, LDLDIRECT in the last 72 hours. Thyroid Function Tests: No results for input(s): TSH, T4TOTAL, FREET4, T3FREE, THYROIDAB in the last 72 hours. Anemia Panel: No results for input(s): VITAMINB12, FOLATE, FERRITIN, TIBC, IRON, RETICCTPCT in the last 72 hours. Sepsis Labs: No results for input(s): PROCALCITON, LATICACIDVEN in the last 168 hours.  Recent Results (from the past 240 hour(s))  MRSA PCR Screening     Status: None   Collection Time: 03/10/18 10:05 PM  Result Value Ref Range Status   MRSA by PCR NEGATIVE NEGATIVE Final    Comment:        The GeneXpert MRSA Assay (FDA approved for NASAL specimens only), is one component of a comprehensive MRSA colonization surveillance program. It is not intended to diagnose MRSA infection nor to guide or monitor treatment for MRSA infections. Performed at [facility], [place] [address], [place], [place] [address]      Radiology Studies: No results found.  Scheduled Meds: .  allopurinol  300 mg Oral Daily  . docusate sodium  100 mg Oral BID  . DOXOrubicin/vinCRIStine/etoposide CHEMO IV infusion for Inpatient CI   Intravenous Once  . fenofibrate  160 mg Oral Daily  . heparin  5,000 Units Subcutaneous BID  . insulin aspart  0-9 Units Subcutaneous TID WC  . predniSONE  30 mg Oral Q breakfast   Continuous Infusions: . sodium chloride Stopped (03/18/18 0102)     LOS: 9 days   Marylu Lund, MD [facility] Pager [phone]  If 7PM-7AM, please contact night-coverage [email or web address] Password [facility] 03/19/2018, 3:23 PM

## 2018-03-20 DIAGNOSIS — N179 Acute kidney failure, unspecified: Secondary | ICD-10-CM

## 2018-03-20 DIAGNOSIS — D473 Essential (hemorrhagic) thrombocythemia: Secondary | ICD-10-CM

## 2018-03-20 DIAGNOSIS — E790 Hyperuricemia without signs of inflammatory arthritis and tophaceous disease: Secondary | ICD-10-CM

## 2018-03-20 LAB — GLUCOSE, CAPILLARY
GLUCOSE-CAPILLARY: 276 mg/dL — AB (ref 70–99)
GLUCOSE-CAPILLARY: 175 mg/dL — AB (ref 70–99)
GLUCOSE-CAPILLARY: 249 mg/dL — AB (ref 70–99)
Glucose-Capillary: 176 mg/dL — ABNORMAL HIGH (ref 70–99)
Glucose-Capillary: 265 mg/dL — ABNORMAL HIGH (ref 70–99)
Glucose-Capillary: 348 mg/dL — ABNORMAL HIGH (ref 70–99)

## 2018-03-20 LAB — TYPE AND SCREEN
ABO/RH(D): O POS
Antibody Screen: NEGATIVE
UNIT DIVISION: 0
UNIT DIVISION: 0
Unit division: 0
Unit division: 0

## 2018-03-20 LAB — BPAM RBC
BLOOD PRODUCT EXPIRATION DATE: 201908012359
BLOOD PRODUCT EXPIRATION DATE: 201908012359
Blood Product Expiration Date: 201908022359
Blood Product Expiration Date: 201908022359
ISSUE DATE / TIME: 201907050101
ISSUE DATE / TIME: 201907042219
ISSUE DATE / TIME: 201907060054
UNIT TYPE AND RH: 5100
Unit Type and Rh: 5100
Unit Type and Rh: 5100
Unit Type and Rh: 5100

## 2018-03-20 LAB — CBC WITH DIFFERENTIAL/PLATELET
Basophils Absolute: 0 10*3/uL (ref 0.0–0.1)
Basophils Relative: 0 %
Eosinophils Absolute: 0 10*3/uL (ref 0.0–0.7)
Eosinophils Relative: 0 %
HCT: 22.1 % — ABNORMAL LOW (ref 39.0–52.0)
HEMOGLOBIN: 7 g/dL — AB (ref 13.0–17.0)
LYMPHS ABS: 0.3 10*3/uL — AB (ref 0.7–4.0)
Lymphocytes Relative: 5 %
MCH: 27.2 pg (ref 26.0–34.0)
MCHC: 31.7 g/dL (ref 30.0–36.0)
MCV: 86 fL (ref 78.0–100.0)
MONO ABS: 0.4 10*3/uL (ref 0.1–1.0)
MONOS PCT: 6 %
NEUTROS ABS: 6.2 10*3/uL (ref 1.7–7.7)
NEUTROS PCT: 89 %
Platelets: 324 10*3/uL (ref 150–400)
RBC: 2.57 MIL/uL — ABNORMAL LOW (ref 4.22–5.81)
RDW: 18.3 % — AB (ref 11.5–15.5)
WBC: 7 10*3/uL (ref 4.0–10.5)

## 2018-03-20 LAB — HEMOGLOBIN AND HEMATOCRIT, BLOOD
HCT: 24.9 % — ABNORMAL LOW (ref 39.0–52.0)
HEMOGLOBIN: 8.2 g/dL — AB (ref 13.0–17.0)

## 2018-03-20 LAB — URIC ACID: Uric Acid, Serum: 6.8 mg/dL (ref 3.7–8.6)

## 2018-03-20 LAB — MAGNESIUM: Magnesium: 2 mg/dL (ref 1.7–2.4)

## 2018-03-20 LAB — LACTATE DEHYDROGENASE: LDH: 354 U/L — ABNORMAL HIGH (ref 98–192)

## 2018-03-20 LAB — PREPARE RBC (CROSSMATCH)

## 2018-03-20 MED ORDER — SODIUM CHLORIDE 0.9 % IV SOLN
Freq: Once | INTRAVENOUS | Status: AC
  Administered 2018-03-20: 18 mg via INTRAVENOUS
  Filled 2018-03-20: qty 4

## 2018-03-20 MED ORDER — VINCRISTINE SULFATE CHEMO INJECTION 1 MG/ML
Freq: Once | INTRAVENOUS | Status: AC
  Administered 2018-03-20: 14:00:00 via INTRAVENOUS
  Filled 2018-03-20: qty 12

## 2018-03-20 MED ORDER — INSULIN ASPART 100 UNIT/ML IV SOLN: 5.0000 [IU] | Freq: Once | INTRAVENOUS | Status: DC

## 2018-03-20 MED ORDER — SODIUM CHLORIDE 0.9% IV SOLUTION
Freq: Once | INTRAVENOUS | Status: AC
  Administered 2018-03-20: 15:00:00 via INTRAVENOUS

## 2018-03-20 MED ORDER — SODIUM CHLORIDE 0.9 % IJ SOLN
Freq: Once | INTRAMUSCULAR | Status: AC
  Administered 2018-03-21: 12:00:00 via INTRATHECAL
  Filled 2018-03-20: qty 0.48

## 2018-03-20 NOTE — Progress Notes (Signed)
PT Cancellation Note  Patient Details Name: Mark Frey MRN: [id number] DOB: 1956/04/26   Cancelled Treatment:    Reason Eval/Treat Not Completed: Attempted PT eval. Pt was up in the recliner. He declined participation with PT on today. He stated he did not have the right footwear to work with therapy. Will check back another day.    Weston Anna, MPT Pager: [phone]

## 2018-03-20 NOTE — Progress Notes (Signed)
Mark Frey   DOB:Sep 12, 1956   VC#:[id number]   MBW#:[id number]  Oncology f/u note   Subjective: Pt has been tolerating chemotherapy very well, no significant nausea or other side effects.  He had one episode of bloody bowel movement yesterday, no bowel movement today so far.  His hemoglobin has been dropping, and he required a total of 4 units so far.  He still has significant right lower extremity edema, he has been sitting in a recliner today, but has not walked much due to the leg swelling and pain, he uses the bedside commode.  Heparin drip has been changed to subcutaneous injection over the weekend, due to his rectal bleeding.  Objective:  Vitals:   03/20/18 1500 03/20/18 1515  BP: 135/79 130/75  Pulse: 93 93  Resp: 16   Temp: 98 F (36.7 C) 98.1 F (36.7 C)  SpO2: 98% 98%    Body mass index is 39.8 kg/m.  Intake/Output Summary (Last 24 hours) at 03/20/2018 1806 Last data filed at 03/20/2018 1755 Gross per 24 hour  Intake 1258.27 ml  Output 2680 ml  Net -1421.73 ml     Sclerae unicteric  Oropharynx clear  No peripheral adenopathy  Lungs clear -- no rales or rhonchi  Heart regular rate and rhythm  Abdomen soft, mild tenderness at right side abdomen, there is a large palpable abdominal mass in the most of the right side abdomen.   MSK no focal spinal tenderness, (+) pitting edema on b/l LEs, R>L   Neuro nonfocal    CBG (last 3)  Recent Labs    03/20/18 0803 03/20/18 1152 03/20/18 1726  GLUCAP 175* 265* 249*     Labs:  Lab Results  Component Value Date   WBC 7.0 03/20/2018   HGB 7.0 (L) 03/20/2018   HCT 22.1 (L) 03/20/2018   MCV 86.0 03/20/2018   PLT 324 03/20/2018   NEUTROABS 6.2 03/20/2018    CMP Latest Ref Rng & Units 03/19/2018 03/18/2018 03/17/2018  Glucose 70 - 99 mg/dL 187(H) 168(H) 148(H)  BUN 8 - 23 mg/dL 36(H) 31(H) 33(H)  Creatinine 0.61 - 1.24 mg/dL 0.98 1.10 1.66(H)  Sodium 135 - 145 mmol/L 138 136 136  Potassium 3.5 - 5.1 mmol/L 4.8 4.9 4.4   Chloride 98 - 111 mmol/L 108 104 103  CO2 22 - 32 mmol/L 23 22 21(L)  Calcium 8.9 - 10.3 mg/dL 8.1(L) 8.2(L) 8.1(L)  Total Protein 6.5 - 8.1 g/dL - 6.0(L) 6.1(L)  Total Bilirubin 0.3 - 1.2 mg/dL - 0.8 0.9  Alkaline Phos 38 - 126 U/L - 39 36(L)  AST 15 - 41 U/L - 37 84(H)  ALT 0 - 44 U/L - 38 51(H)     Urine Studies No results for input(s): UHGB, CRYS in the last 72 hours.  Invalid input(s): UACOL, UAPR, USPG, UPH, UTP, UGL, UKET, UBIL, UNIT, UROB, ULEU, UEPI, UWBC, URBC, UBAC, CAST, UCOM, [place]  Basic Metabolic Panel: Recent Labs  Lab 03/15/18 2113  03/17/18 0618 03/18/18 0653 03/19/18 0501 03/20/18 0533  NA  --   --  136 136 138  --   K  --    < > 4.4 4.9 4.8  --   CL  --   --  103 104 108  --   CO2  --   --  21* 22 23  --   GLUCOSE  --   --  148* 168* 187*  --   BUN  --   --  33* 31* 36*  --  CREATININE  --   --  1.66* 1.10 0.98  --   CALCIUM  --   --  8.1* 8.2* 8.1*  --   MG 2.0  --   --  2.4 2.4 2.0  PHOS 1.9*  --   --   --   --   --    < > = values in this interval not displayed.   GFR Estimated Creatinine Clearance: 102.2 mL/min (by C-G formula based on SCr of 0.98 mg/dL). Liver Function Tests: Recent Labs  Lab 03/17/18 0618 03/18/18 0653  AST 84* 37  ALT 51* 38  ALKPHOS 36* 39  BILITOT 0.9 0.8  PROT 6.1* 6.0*  ALBUMIN 2.7* 2.6*   No results for input(s): LIPASE, AMYLASE in the last 168 hours. No results for input(s): AMMONIA in the last 168 hours. Coagulation profile Recent Labs  Lab 03/17/18 0618  INR 1.22    CBC: Recent Labs  Lab 03/15/18 0457 03/16/18 0501  03/17/18 0618  03/17/18 1944 03/18/18 0653 03/19/18 0501 03/19/18 1755 03/20/18 0533  WBC 7.8 9.0  --  12.3*  --   --   --  11.0*  --  7.0  NEUTROABS  --   --   --  9.9*  --   --   --   --   --  6.2  HGB 7.5* 7.3*   < > 8.4*   < > 7.3* 7.9* 7.3* 7.6* 7.0*  HCT 25.1* 23.9*   < > 25.7*   < > 22.3* 24.6* 22.9* 23.7* 22.1*  MCV 87.2 85.4  --  86.5  --   --   --  85.8  --  86.0   PLT 475* 500*  --  414*  --   --   --  346  --  324   < > = values in this interval not displayed.   Cardiac Enzymes: No results for input(s): CKTOTAL, CKMB, CKMBINDEX, TROPONINI in the last 168 hours. BNP: Invalid input(s): POCBNP CBG: Recent Labs  Lab 03/19/18 1714 03/19/18 2133 03/20/18 0803 03/20/18 1152 03/20/18 1726  GLUCAP 264* 256* 175* 265* 249*   D-Dimer No results for input(s): DDIMER in the last 72 hours. Hgb A1c No results for input(s): HGBA1C in the last 72 hours. Lipid Profile No results for input(s): CHOL, HDL, LDLCALC, TRIG, CHOLHDL, LDLDIRECT in the last 72 hours. Thyroid function studies No results for input(s): TSH, T4TOTAL, T3FREE, THYROIDAB in the last 72 hours.  Invalid input(s): FREET3 Anemia work up No results for input(s): VITAMINB12, FOLATE, FERRITIN, TIBC, IRON, RETICCTPCT in the last 72 hours. Microbiology Recent Results (from the past 240 hour(s))  MRSA PCR Screening     Status: None   Collection Time: 03/10/18 10:05 PM  Result Value Ref Range Status   MRSA by PCR NEGATIVE NEGATIVE Final    Comment:        The GeneXpert MRSA Assay (FDA approved for NASAL specimens only), is one component of a comprehensive MRSA colonization surveillance program. It is not intended to diagnose MRSA infection nor to guide or monitor treatment for MRSA infections. Performed at [facility], [place] [address], [place], [place] [address]    Pathology   Diagnosis 03/13/2018 Peritoneum, biopsy, RLQ cavity - HIGH GRADE B-CELL LYMPHOMA. - SEE COMMENT. Microscopic Comment Sections show needle core biopsy fragments of soft tissue displaying a dense and relatively monomorphic infiltrate of atypical medium to large lymphoid appearing cells displaying high nuclear cytoplasmic ratio, partially clumped to vesicular  chromatin and single to multiple nucleoli associated with brisk mitosis, apoptosis and a starry sky pattern. The appearance is  primarily diffuse with lack of atypical follicles or nodules. To further evaluate this process, flow cytometric analysis was performed ([id number]) and shows a monoclonal, kappa restricted B-cell population expressing pan B-cell antigens including CD20 associated with CD10 expression. No CD5 expression is identified. In addition, immunohistochemical stains were performed including BCL-2, BCL-6, CD10, CD20, CD3, CD30, CD34, LCA, CD5, CD79a, and TdT with appropriate controls. The lymphoid cells are diffusely positive for LCA and are predominantly composed of B cells as seen with CD20 and CD79a associated with CD10 and BCL-6 expression. There is also patchy, weak BCL-2 expression. No significant staining is seen with CD30, CD34 or TdT and there is no apparent co-expression of CD5 in B-cell areas. There is a minor T-cell population in the background as primarily highlighted with CD3. The overall findings are most consistent with high grade B-cell lymphoma. Based on the overall findings, the differential diagnosis includes diffuse large cell lymphoma, Burkitt lymphoma as well as Burkitt-like lymphoma. To evaluate for the presence of double or triple hit lymphoma, FISH studies will be performed on paraffin embedded tissue and the results will be reported separately. (BNS:ecj 03/17/2018)   Colon, biopsy, right tumor 03/15/2018 - ATYPICAL LYMPHOID PROLIFERATION CONSISTENT WITH NON-HODGKIN LYMPHOMA, SEE COMMENT.   Studies:  No results found.  Assessment: 62 y.o. male with past medical history of diabetes and hyperlipidemia, presented with acute right lower extremity DVT, and further work-up found submassive PE and a large abdominal mass.  1. 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 Right ascending colon mass, likely due to abdominal lymphoma direct invasion 3.  Submassive PE and right lower extremity DVT, on heparin drip 4.  Microcytic anemia, likely secondary to GI  bleeding and iron deficiency, rule out bone marrow involvement from lymphoma 5. Type 2 diabetes  6. Obesity  7. Anemia, secondary to GI bleeding and iron deficient anemia, rule out lymphoma marrow involvement, he received blood transfusion 2u on 7/4, 1u on 7/5 and 1u on 7/8, iv feraheme on 7/4  8.  Thrombocytosis secondary to iron deficiency and malignancy 9. AKI probably secondary to diuretics, resolved  10.  Hyperuricemia secondary to chemo, resolved now    Plan:  -He is tolerating chemotherapy well, uric acid level increased after chemo started, resolved after allopurinol, will continue allopurinol for prophylaxis -Intrathecal methotrexate tomorrow by IR -he will complete first cycle [facility] tomorrow afternoon -Plan to give Rituxan on Wednesday 7/10, starting in the early morning  -Due to his recurrent rectal bleeding, heparin infusion has been held, he is now on heparin 5000u [place] which is DVT prophylactic dose, I think it should be OK to give q8h, maybe starting tomorrow  -I discussed IR thrombectomy for his DVT, so he can start walking.  However patient is very reluctant and declined for now.,  He is willing to reconsider if his leg is swelling does not improve in the next few weeks, and he can not get around well  -I strongly encouraged him to participate physical therapy evaluation, to see if he needs rehab. His wife works, but she will arrange someone to help him if he goes home. -Continue daily CBC with differential, and will get a CMP tomorrow morning. Will d/c daily LDH and magnesium level. -will f/u closely. I spoke with Dr. Wyline Copas today, updated his wife at bedside   I spent a total of 30  mins in coordinate his care, >50% on face to face counseling.  Truitt Merle, MD 03/20/2018  6:06 PM

## 2018-03-20 NOTE — Progress Notes (Addendum)
PROGRESS NOTE    Mark Frey  BSJ:[id number] DOB: 1956-07-28 DOA: 03/10/2018 PCP: [name], MD    Brief Narrative:  Mark Frey is a 62 y.o. male with a history of diabetes mellitus that presented secondary to DVT found on ultrasound. He was also found to have a massive bilateral PE in addition to concern for abdominal malignancy on CT scan. Started on heparin IV.  Assessment & Plan:   Active Problems:   Hyperlipidemia   Controlled diabetes mellitus type II without complication (HCC)   Hypertension   Bilateral pulmonary embolism (HCC)   Anemia   Hypoglycemia   Occult blood in stools   Abdominal mass   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HCC)   Acute deep vein thrombosis (DVT) of femoral vein of right lower extremity (HCC)  Bilateral pulmonary embolism -Noted to be Involving lobar, segmental and subsegmental branches bilaterally with evidence of heart strain.  -Patient determined to not be a tPA candidate per PCCM.  -Patient was continued on heparin gtt, however complications involving transfusion dependent anemia from lower GI bleed were noted -Heparin gtt has since been stopped -Appreciate Oncology input. Recommendation for UFH at 5K units subQ BID with recommendation to transfuse if hgb <7.0 -Hgb remains stable at present -No further bleeding appreciated at this time. -Per Oncology recs, consideration to increase to TID dosing of UFH on 7/9 if pt remains stable  DVT -Right saphenofemoral junction, right common femoral vein, posterior tibial/peroneal veins. 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   -Upper endoscopy unremarkable.   -Colonoscopy notable for obstructing mass  -Pathology results notable for diffuse B cell lymphoma.  -Appreciate input by Oncology. Recommendation for chemo with intent on cure with port placed on 7/5 -Patient is tolerating chemo thus far. Continue chemo per Oncology  Elevated blood pressure -No history of hypertension noted -Patient is continued on PRN hydralazine -BP currently stable  Diabetes mellitus, type 2 -Well controlled.  -Hemoglobin A1C of 5.4%. currently continued on Janumet and glimepiride as an outpatient. -Continue SSI coverage as needed  Heme positive stool Acute blood loss anemia -Colonoscopy findings noted as per above -Fereheme given 7/4 x 1 dose -Thus far, patient has required total of 3 units PRBCs for lower GI bleed -Now on UFH per above -Hgb down to 7.0 this AM. Will transfuse 1 unit PRBC today -Repeat CBC in AM  Obesity -Body mass index is 38.4 kg/m. -Currently stable  Scrotal swelling -Scrotal US reviewed. Scrotal swelling noted -Arterial and venous doppler with good flow.  -Given one dose of lasix, however now stopped given ARF below -Continues to void well  ARF -Cr peaked to 1.6 on 7/5 following lasix -Lasix has been discontinued -Renal function has normalized after period of gentle fluids -Avoid nephrotoxic agents if possible -Will recheck bmet in AM  DVT prophylaxis: Heparin subq Code Status: Full code Family Communication: Patient in room, family at bedside Disposition Plan: Uncertain at this time  Consultants:   Oncology  Gastroenterology  General surgery  Pulmonary  Procedures:   EGD/colonoscopy on 03/15/2018  Antimicrobials: Anti-infectives (From admission, onward)   Start  Dose/Rate Route Frequency Ordered Stop   03/17/18 1000  ceFAZolin (ANCEF) IVPB 2g/100 mL premix  Status:  Discontinued     2 g 200 mL/hr over 30 Minutes Intravenous To Radiology 03/16/18 1132  03/17/18 1010      Subjective: In good spirits. No nausea, chest pain, sob  Objective: Vitals:   03/20/18 1347 03/20/18 1500 03/20/18 1515 03/20/18 1830  BP: 132/70 135/79 130/75 137/81  Pulse: (!) 105 93 93 96  Resp: 12 16  18   Temp: 98.6 F (37 C) 98 F (36.7 C) 98.1 F (36.7 C) 98.3 F (36.8 C)  TempSrc: Oral Oral Oral Oral  SpO2: 99% 98% 98% 100%  Weight:      Height:        Intake/Output Summary (Last 24 hours) at 03/20/2018 1838 Last data filed at 03/20/2018 1830 Gross per 24 hour  Intake 1573.27 ml  Output 2680 ml  Net -1106.73 ml   Filed Weights   03/15/18 1155 03/18/18 0230 03/18/18 0845  Weight: 117.9 kg (260 lb) 90.9 kg (200 lb 6.4 oz) 122.2 kg (269 lb 8 oz)    Examination: General exam: Awake, laying in bed, in nad Respiratory system: Normal respiratory effort, no wheezing Cardiovascular system: regular rate, s1, s2 Gastrointestinal system: Soft, nondistended, positive BS Central nervous system: CN2-12 grossly intact, strength intact Extremities: Perfused, no clubbing Skin: Normal skin turgor, no notable skin lesions seen Psychiatry: Mood normal // no visual hallucinations   Data Reviewed: I have personally reviewed following labs and imaging studies  CBC: Recent Labs  Lab 03/15/18 0457 03/16/18 0501  03/17/18 0618  03/17/18 1944 03/18/18 0653 03/19/18 0501 03/19/18 1755 03/20/18 0533  WBC 7.8 9.0  --  12.3*  --   --   --  11.0*  --  7.0  NEUTROABS  --   --   --  9.9*  --   --   --   --   --  6.2  HGB 7.5* 7.3*   < > 8.4*   < > 7.3* 7.9* 7.3* 7.6* 7.0*  HCT 25.1* 23.9*   < > 25.7*   < > 22.3* 24.6* 22.9* 23.7* 22.1*  MCV 87.2 85.4  --  86.5  --   --   --  85.8  --  86.0  PLT 475* 500*  --  414*  --   --   --  346  --  324   < > = values in this interval not displayed.   Basic Metabolic Panel: Recent Labs  Lab 03/15/18 2113 03/17/18 0618 03/18/18 0653 03/19/18 0501 03/20/18 0533  NA  --  136 136 138  --   K  --  4.4 4.9 4.8  --   CL   --  103 104 108  --   CO2  --  21* 22 23  --   GLUCOSE  --  148* 168* 187*  --   BUN  --  33* 31* 36*  --   CREATININE  --  1.66* 1.10 0.98  --   CALCIUM  --  8.1* 8.2* 8.1*  --   MG 2.0  --  2.4 2.4 2.0  PHOS 1.9*  --   --   --   --    GFR: Estimated Creatinine Clearance: 102.2 mL/min (by C-G formula based on SCr of 0.98 mg/dL). Liver Function Tests: Recent Labs  Lab 03/17/18 0618 03/18/18 0653  AST 84* 37  ALT 51* 38  ALKPHOS 36* 39  BILITOT 0.9 0.8  PROT 6.1* 6.0*  ALBUMIN 2.7* 2.6*   No results for input(s): LIPASE, AMYLASE in the last 168 hours. No results for input(s): AMMONIA in the last 168 hours. Coagulation Profile: Recent Labs  Lab 03/17/18 0618  INR 1.22   Cardiac Enzymes: No results for input(s): CKTOTAL, CKMB, CKMBINDEX, TROPONINI in the last 168 hours. BNP (last 3 results) No results for input(s): PROBNP in the last 8760 hours. HbA1C: No results for input(s): HGBA1C in the last 72 hours. CBG: Recent Labs  Lab 03/19/18 1714 03/19/18 2133 03/20/18 0803 03/20/18 1152 03/20/18 1726  GLUCAP 264* 256* 175* 265* 249*   Lipid Profile: No results for input(s): CHOL, HDL, LDLCALC, TRIG, CHOLHDL, LDLDIRECT in the last 72 hours. Thyroid Function Tests: No results for input(s): TSH, T4TOTAL, FREET4, T3FREE, THYROIDAB in the last 72 hours. Anemia Panel: No results for input(s): VITAMINB12, FOLATE, FERRITIN, TIBC, IRON, RETICCTPCT in the last 72 hours. Sepsis Labs: No results for input(s): PROCALCITON, LATICACIDVEN in the last 168 hours.  Recent Results (from the past 240 hour(s))  MRSA PCR Screening     Status: None   Collection Time: 03/10/18 10:05 PM  Result Value Ref Range Status   MRSA by PCR NEGATIVE NEGATIVE Final    Comment:        The GeneXpert MRSA Assay (FDA approved for NASAL specimens only), is one component of a comprehensive MRSA colonization surveillance program. It is not intended to diagnose MRSA infection nor to guide or monitor  treatment for MRSA infections. Performed at [facility], [place] [address] [place], [place] [address]      Radiology Studies: No results found.  Scheduled Meds: . allopurinol  300 mg Oral Daily  . docusate sodium  100 mg Oral BID  . DOXOrubicin/vinCRIStine/etoposide CHEMO IV infusion for Inpatient CI   Intravenous Once  . fenofibrate  160 mg Oral Daily  . heparin  5,000 Units Subcutaneous BID  . insulin aspart  0-9 Units Subcutaneous TID WC  . [START ON 03/21/2018] methotrexate INTRATHECAL (+/- HYDROCORTISONE,Ara-C)   Intrathecal Once  . predniSONE  30 mg Oral Q breakfast   Continuous Infusions: . sodium chloride 10 mL/hr at 03/20/18 0437     LOS: 10 days   Marylu Lund, MD [facility] Pager [phone]  If 7PM-7AM, please contact night-coverage [email or web address] Password [facility] 03/20/2018, 6:38 PM

## 2018-03-21 ENCOUNTER — Inpatient Hospital Stay (HOSPITAL_COMMUNITY): Payer: BLUE CROSS/BLUE SHIELD

## 2018-03-21 LAB — CSF CELL COUNT WITH DIFFERENTIAL
RBC Count, CSF: 1 /mm3 — ABNORMAL HIGH
Tube #: 4
WBC CSF: 2 /mm3 (ref 0–5)

## 2018-03-21 LAB — COMPREHENSIVE METABOLIC PANEL
ALT: 26 U/L (ref 0–44)
AST: 17 U/L (ref 15–41)
Albumin: 2.5 g/dL — ABNORMAL LOW (ref 3.5–5.0)
Alkaline Phosphatase: 36 U/L — ABNORMAL LOW (ref 38–126)
Anion gap: 5 (ref 5–15)
BUN: 28 mg/dL — ABNORMAL HIGH (ref 8–23)
CALCIUM: 8.4 mg/dL — AB (ref 8.9–10.3)
CO2: 25 mmol/L (ref 22–32)
CREATININE: 0.76 mg/dL (ref 0.61–1.24)
Chloride: 108 mmol/L (ref 98–111)
Glucose, Bld: 156 mg/dL — ABNORMAL HIGH (ref 70–99)
Potassium: 4.5 mmol/L (ref 3.5–5.1)
Sodium: 138 mmol/L (ref 135–145)
Total Bilirubin: 0.6 mg/dL (ref 0.3–1.2)
Total Protein: 5.3 g/dL — ABNORMAL LOW (ref 6.5–8.1)

## 2018-03-21 LAB — BPAM RBC
Blood Product Expiration Date: 201908022359
ISSUE DATE / TIME: 201907081450
Unit Type and Rh: 5100

## 2018-03-21 LAB — CBC WITH DIFFERENTIAL/PLATELET
BASOS ABS: 0 10*3/uL (ref 0.0–0.1)
BASOS PCT: 0 %
Eosinophils Absolute: 0 10*3/uL (ref 0.0–0.7)
Eosinophils Relative: 0 %
HEMATOCRIT: 24.1 % — AB (ref 39.0–52.0)
Hemoglobin: 7.9 g/dL — ABNORMAL LOW (ref 13.0–17.0)
LYMPHS PCT: 12 %
Lymphs Abs: 0.8 10*3/uL (ref 0.7–4.0)
MCH: 28.1 pg (ref 26.0–34.0)
MCHC: 32.8 g/dL (ref 30.0–36.0)
MCV: 85.8 fL (ref 78.0–100.0)
MONO ABS: 0.1 10*3/uL (ref 0.1–1.0)
Monocytes Relative: 2 %
NEUTROS PCT: 86 %
Neutro Abs: 5.3 10*3/uL (ref 1.7–7.7)
Platelets: 271 10*3/uL (ref 150–400)
RBC: 2.81 MIL/uL — AB (ref 4.22–5.81)
RDW: 16.9 % — AB (ref 11.5–15.5)
WBC: 6.2 10*3/uL (ref 4.0–10.5)

## 2018-03-21 LAB — TYPE AND SCREEN
ABO/RH(D): O POS
Antibody Screen: NEGATIVE
UNIT DIVISION: 0

## 2018-03-21 LAB — GLUCOSE, CAPILLARY
GLUCOSE-CAPILLARY: 204 mg/dL — AB (ref 70–99)
GLUCOSE-CAPILLARY: 137 mg/dL — AB (ref 70–99)
GLUCOSE-CAPILLARY: 137 mg/dL — AB (ref 70–99)
GLUCOSE-CAPILLARY: 334 mg/dL — AB (ref 70–99)
Glucose-Capillary: 104 mg/dL — ABNORMAL HIGH (ref 70–99)

## 2018-03-21 LAB — GLUCOSE, CSF: GLUCOSE CSF: 88 mg/dL — AB (ref 40–70)

## 2018-03-21 LAB — PROTEIN, CSF: Total  Protein, CSF: 32 mg/dL (ref 15–45)

## 2018-03-21 LAB — URIC ACID: URIC ACID, SERUM: 5.4 mg/dL (ref 3.7–8.6)

## 2018-03-21 MED ORDER — INSULIN ASPART 100 UNIT/ML ~~LOC~~ SOLN
6.0000 [IU] | Freq: Once | SUBCUTANEOUS | Status: AC
  Administered 2018-03-21: 6 [IU] via SUBCUTANEOUS

## 2018-03-21 MED ORDER — LIDOCAINE HCL 1 % IJ SOLN
INTRAMUSCULAR | Status: AC
  Filled 2018-03-21: qty 20

## 2018-03-21 MED ORDER — LORAZEPAM 0.5 MG PO TABS: 0.5000 mg | ORAL_TABLET | Freq: Four times a day (QID) | ORAL | 0 refills | Status: DC | PRN

## 2018-03-21 MED ORDER — SODIUM CHLORIDE 0.9 % IV SOLN
750.0000 mg/m2 | Freq: Once | INTRAVENOUS | Status: AC
  Administered 2018-03-21: 1800 mg via INTRAVENOUS
  Filled 2018-03-21: qty 90

## 2018-03-21 MED ORDER — PROCHLORPERAZINE MALEATE 10 MG PO TABS: 10.0000 mg | ORAL_TABLET | Freq: Four times a day (QID) | ORAL | 1 refills | Status: DC | PRN

## 2018-03-21 MED ORDER — ONDANSETRON HCL 40 MG/20ML IJ SOLN
Freq: Once | INTRAMUSCULAR | Status: AC
  Administered 2018-03-21: 36 mg via INTRAVENOUS
  Filled 2018-03-21: qty 8

## 2018-03-21 MED ORDER — ONDANSETRON HCL 8 MG PO TABS: 8.0000 mg | ORAL_TABLET | Freq: Two times a day (BID) | ORAL | 1 refills | Status: DC | PRN

## 2018-03-21 NOTE — Progress Notes (Signed)
PT Cancellation Note  Patient Details Name: Mark Frey MRN: [id number] DOB: December 27, 1955   Cancelled Treatment:    Reason Eval/Treat Not Completed: Other (comment) Pt scheduled to get chemo and pt declined PT stating he knew it would make his back hurt and that he didn't want to hurt while in chemo.  He stated he knew he needed to PT and would do it tomorrow. Wife entered room and discussion with her regarding walker for home use.  Explained that PT would need to do a further assessment to make sure what walker would be right for patient.  They were asking questions about toileting due to LE and scrotum swelling.  Discussed role of OT.  He did not want PT checking back today, but requested tomorrow.     Galen Manila 03/21/2018, 9:48 AM

## 2018-03-21 NOTE — Progress Notes (Signed)
PROGRESS NOTE    Mark Frey  CHY:[id number] DOB: 04/07/1956 DOA: 03/10/2018 PCP: [name], MD    Brief Narrative:  Mark Frey is a 62 y.o. male with a history of diabetes mellitus that presented secondary to DVT found on ultrasound. He was also found to have a massive bilateral PE in addition to concern for abdominal malignancy on CT scan. Started on heparin IV.  Assessment & Plan:   Active Problems:   Hyperlipidemia   Controlled diabetes mellitus type II without complication (HCC)   Hypertension   Bilateral pulmonary embolism (HCC)   Anemia   Hypoglycemia   Occult blood in stools   Abdominal mass   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HCC)   Acute deep vein thrombosis (DVT) of femoral vein of right lower extremity (HCC)  Bilateral pulmonary embolism -Noted to be Involving lobar, segmental and subsegmental branches bilaterally with evidence of heart strain.  -Patient determined to not be a tPA candidate per PCCM.  -Patient was initially started on heparin gtt, however experienced complications involving transfusion dependent anemia from lower GI bleed likely from known friable mass -Heparin gtt has since been stopped -Appreciate Oncology input. Recommendation for UFH at 5K units subQ BID with recommendation to transfuse if hgb <7.0 -Remains hemodynamically stable at present -Per Oncology recs, consideration to increase to TID dosing of UFH if pt remains stable  DVT -Right saphenofemoral junction, right common femoral vein, posterior tibial/peroneal veins. -Heparin has since been stopped in leu of UFH at 5K units subQ bid per above given recurrent GI bleed over the weekend -Seems to be stable at this time. 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   -Upper endoscopy  unremarkable.   -Colonoscopy notable for obstructing mass  -Pathology results notable for diffuse B cell lymphoma.  -Appreciate input by Oncology. Recommendation for chemo with intent on cure with port placed on 7/5 -Patient is tolerating chemo thus far. Continue chemo per Oncology. For intrathecal methotrexate today -Planned for Rituxan 7/10  Elevated blood pressure -No history of hypertension noted -Patient is continued on PRN hydralazine -BP currently stable  Diabetes mellitus, type 2 -Well controlled.  -Hemoglobin A1C of 5.4%. currently continued on Janumet and glimepiride as an outpatient. -Continue SSI coverage as needed  Heme positive stool Acute blood loss anemia -Colonoscopy findings noted as per above -Fereheme given 7/4 x 1 dose -Thus far, patient has required total of 4 units PRBCs for lower GI bleed thus far this admission -Now on UFH per above -Recheck CBC in AM  Obesity -Body mass index is 38.4 kg/m. -Remains stable  Scrotal swelling -Scrotal US reviewed. Scrotal swelling noted -Arterial and venous doppler with good flow.  -Given one dose of lasix, however now stopped given ARF below -BMET reviewed. Renal function stable  ARF -Cr peaked to 1.6 on 7/5 following lasix -Lasix has been discontinued -Renal function has normalized after period of gentle fluids -Avoid nephrotoxic agents if possible -continue to follow renal function closely  DVT prophylaxis: Heparin subq Code Status: Full code Family Communication: Patient in room, family at bedside Disposition Plan: Uncertain at this time  Consultants:   Oncology  Gastroenterology  General surgery  Pulmonary  Procedures:   EGD/colonoscopy on 03/15/2018  Antimicrobials: Anti-infectives (From admission, onward)   Start     Dose/Rate Route  Frequency Ordered Stop   03/17/18 1000  ceFAZolin (ANCEF) IVPB 2g/100 mL premix  Status:  Discontinued     2 g 200 mL/hr over 30 Minutes Intravenous To  Radiology 03/16/18 1132 03/17/18 1010      Subjective: In good spirits. No nausea, chest pain, sob  Objective: Vitals:   03/20/18 1515 03/20/18 1830 03/20/18 2116 03/21/18 0454  BP: 130/75 137/81 136/80 120/69  Pulse: 93 96 82 64  Resp:  18 19 19   Temp: 98.1 F (36.7 C) 98.3 F (36.8 C) 98.2 F (36.8 C) 97.6 F (36.4 C)  TempSrc: Oral Oral Oral Oral  SpO2: 98% 100% 98% 100%  Weight:      Height:        Intake/Output Summary (Last 24 hours) at 03/21/2018 1352 Last data filed at 03/21/2018 1308 Gross per 24 hour  Intake 1234.36 ml  Output 3420 ml  Net -2185.64 ml   Filed Weights   03/15/18 1155 03/18/18 0230 03/18/18 0845  Weight: 117.9 kg (260 lb) 90.9 kg (200 lb 6.4 oz) 122.2 kg (269 lb 8 oz)    Examination: General exam: Conversant, in no acute distress Respiratory system: normal chest rise, clear, no audible wheezing Cardiovascular system: regular rhythm, s1-s2 Gastrointestinal system: Nondistended, nontender, pos BS Central nervous system: No seizures, no tremors Extremities: No cyanosis, no joint deformities Skin: No rashes, no pallor Psychiatry: Affect normal // no auditory hallucinations   Data Reviewed: I have personally reviewed following labs and imaging studies  CBC: Recent Labs  Lab 03/16/18 0501  03/17/18 0618  03/19/18 0501 03/19/18 1755 03/20/18 0533 03/20/18 2056 03/21/18 0559  WBC 9.0  --  12.3*  --  11.0*  --  7.0  --  6.2  NEUTROABS  --   --  9.9*  --   --   --  6.2  --  5.3  HGB 7.3*   < > 8.4*   < > 7.3* 7.6* 7.0* 8.2* 7.9*  HCT 23.9*   < > 25.7*   < > 22.9* 23.7* 22.1* 24.9* 24.1*  MCV 85.4  --  86.5  --  85.8  --  86.0  --  85.8  PLT 500*  --  414*  --  346  --  324  --  271   < > = values in this interval not displayed.   Basic Metabolic Panel: Recent Labs  Lab 03/15/18 2113 03/17/18 0618 03/18/18 0653 03/19/18 0501 03/20/18 0533 03/21/18 0559  NA  --  136 136 138  --  138  K  --  4.4 4.9 4.8  --  4.5  CL  --  103 104 108   --  108  CO2  --  21* 22 23  --  25  GLUCOSE  --  148* 168* 187*  --  156*  BUN  --  33* 31* 36*  --  28*  CREATININE  --  1.66* 1.10 0.98  --  0.76  CALCIUM  --  8.1* 8.2* 8.1*  --  8.4*  MG 2.0  --  2.4 2.4 2.0  --   PHOS 1.9*  --   --   --   --   --    GFR: Estimated Creatinine Clearance: 125.2 mL/min (by C-G formula based on SCr of 0.76 mg/dL). Liver Function Tests: Recent Labs  Lab 03/17/18 0618 03/18/18 0653 03/21/18 0559  AST 84* 37 17  ALT 51* 38 26  ALKPHOS 36* 39 36*  BILITOT 0.9 0.8  0.6  PROT 6.1* 6.0* 5.3*  ALBUMIN 2.7* 2.6* 2.5*   No results for input(s): LIPASE, AMYLASE in the last 168 hours. No results for input(s): AMMONIA in the last 168 hours. Coagulation Profile: Recent Labs  Lab 03/17/18 0618  INR 1.22   Cardiac Enzymes: No results for input(s): CKTOTAL, CKMB, CKMBINDEX, TROPONINI in the last 168 hours. BNP (last 3 results) No results for input(s): PROBNP in the last 8760 hours. HbA1C: No results for input(s): HGBA1C in the last 72 hours. CBG: Recent Labs  Lab 03/20/18 1726 03/20/18 2127 03/21/18 0257 03/21/18 0720 03/21/18 1236  GLUCAP 249* 348* 204* 137* 104*   Lipid Profile: No results for input(s): CHOL, HDL, LDLCALC, TRIG, CHOLHDL, LDLDIRECT in the last 72 hours. Thyroid Function Tests: No results for input(s): TSH, T4TOTAL, FREET4, T3FREE, THYROIDAB in the last 72 hours. Anemia Panel: No results for input(s): VITAMINB12, FOLATE, FERRITIN, TIBC, IRON, RETICCTPCT in the last 72 hours. Sepsis Labs: No results for input(s): PROCALCITON, LATICACIDVEN in the last 168 hours.  No results found for this or any previous visit (from the past 240 hour(s)).   Radiology Studies: No results found.  Scheduled Meds: . allopurinol  300 mg Oral Daily  . cyclophosphamide  750 mg/m2 (Treatment Plan Recorded) Intravenous Once  . docusate sodium  100 mg Oral BID  . fenofibrate  160 mg Oral Daily  . insulin aspart  0-9 Units Subcutaneous TID WC    . methotrexate INTRATHECAL (+/- HYDROCORTISONE,Ara-C)   Intrathecal Once  . predniSONE  30 mg Oral Q breakfast   Continuous Infusions: . sodium chloride 10 mL/hr at 03/20/18 0437     LOS: 11 days   Marylu Lund, MD [facility] Pager [phone]  If 7PM-7AM, please contact night-coverage [email or web address] Password [email or web address] 03/21/2018, 1:52 PM

## 2018-03-21 NOTE — Progress Notes (Signed)
Mark Frey   DOB:08/06/1956   QJ#:[id number]   LSL#:[id number]  Oncology f/u note   Subjective: Pt had LP and intrathecal chemotherapy by IR today.  He tolerated procedure well.  He had one bowel movement yesterday, formed, none today.  Hemoglobin increased to 7.9 this morning after 1 unit of blood yesterday.  No other new complaints.  Objective:  Vitals:   03/21/18 0454 03/21/18 1413  BP: 120/69 122/77  Pulse: 64 81  Resp: 19 14  Temp: 97.6 F (36.4 C) 97.6 F (36.4 C)  SpO2: 100% 97%    Body mass index is 39.8 kg/m.  Intake/Output Summary (Last 24 hours) at 03/21/2018 2126 Last data filed at 03/21/2018 1924 Gross per 24 hour  Intake 1402.29 ml  Output 3620 ml  Net -2217.71 ml     Sclerae unicteric  Oropharynx clear  No peripheral adenopathy  Lungs clear -- no rales or rhonchi  Heart regular rate and rhythm  Abdomen soft, mild tenderness at right side abdomen, there is a large palpable abdominal mass in the most of the right side abdomen.   MSK no focal spinal tenderness, (+) pitting edema on b/l LEs, R>L   Neuro nonfocal    CBG (last 3)  Recent Labs    03/21/18 0720 03/21/18 1236 03/21/18 1656  GLUCAP 137* 104* 137*     Labs:  Lab Results  Component Value Date   WBC 6.2 03/21/2018   HGB 7.9 (L) 03/21/2018   HCT 24.1 (L) 03/21/2018   MCV 85.8 03/21/2018   PLT 271 03/21/2018   NEUTROABS 5.3 03/21/2018    CMP Latest Ref Rng & Units 03/21/2018 03/19/2018 03/18/2018  Glucose 70 - 99 mg/dL 156(H) 187(H) 168(H)  BUN 8 - 23 mg/dL 28(H) 36(H) 31(H)  Creatinine 0.61 - 1.24 mg/dL 0.76 0.98 1.10  Sodium 135 - 145 mmol/L 138 138 136  Potassium 3.5 - 5.1 mmol/L 4.5 4.8 4.9  Chloride 98 - 111 mmol/L 108 108 104  CO2 22 - 32 mmol/L 25 23 22   Calcium 8.9 - 10.3 mg/dL 8.4(L) 8.1(L) 8.2(L)  Total Protein 6.5 - 8.1 g/dL 5.3(L) - 6.0(L)  Total Bilirubin 0.3 - 1.2 mg/dL 0.6 - 0.8  Alkaline Phos 38 - 126 U/L 36(L) - 39  AST 15 - 41 U/L 17 - 37  ALT 0 - 44 U/L 26 - 38      Urine Studies No results for input(s): UHGB, CRYS in the last 72 hours.  Invalid input(s): UACOL, UAPR, USPG, UPH, UTP, UGL, UKET, UBIL, UNIT, UROB, ULEU, UEPI, UWBC, [place], [place], [place], [place], [place]  Basic Metabolic Panel: Recent Labs  Lab 03/15/18 2113  03/17/18 0618 03/18/18 0653 03/19/18 0501 03/20/18 0533 03/21/18 0559  NA  --   --  136 136 138  --  138  K  --    < > 4.4 4.9 4.8  --  4.5  CL  --   --  103 104 108  --  108  CO2  --   --  21* 22 23  --  25  GLUCOSE  --   --  148* 168* 187*  --  156*  BUN  --   --  33* 31* 36*  --  28*  CREATININE  --   --  1.66* 1.10 0.98  --  0.76  CALCIUM  --   --  8.1* 8.2* 8.1*  --  8.4*  MG 2.0  --   --  2.4 2.4 2.0  --  PHOS 1.9*  --   --   --   --   --   --    < > = values in this interval not displayed.   GFR Estimated Creatinine Clearance: 125.2 mL/min (by C-G formula based on SCr of 0.76 mg/dL). Liver Function Tests: Recent Labs  Lab 03/17/18 0618 03/18/18 0653 03/21/18 0559  AST 84* 37 17  ALT 51* 38 26  ALKPHOS 36* 39 36*  BILITOT 0.9 0.8 0.6  PROT 6.1* 6.0* 5.3*  ALBUMIN 2.7* 2.6* 2.5*   No results for input(s): LIPASE, AMYLASE in the last 168 hours. No results for input(s): AMMONIA in the last 168 hours. Coagulation profile Recent Labs  Lab 03/17/18 0618  INR 1.22    CBC: Recent Labs  Lab 03/16/18 0501  03/17/18 0618  03/19/18 0501 03/19/18 1755 03/20/18 0533 03/20/18 2056 03/21/18 0559  WBC 9.0  --  12.3*  --  11.0*  --  7.0  --  6.2  NEUTROABS  --   --  9.9*  --   --   --  6.2  --  5.3  HGB 7.3*   < > 8.4*   < > 7.3* 7.6* 7.0* 8.2* 7.9*  HCT 23.9*   < > 25.7*   < > 22.9* 23.7* 22.1* 24.9* 24.1*  MCV 85.4  --  86.5  --  85.8  --  86.0  --  85.8  PLT 500*  --  414*  --  346  --  324  --  271   < > = values in this interval not displayed.   Cardiac Enzymes: No results for input(s): CKTOTAL, CKMB, CKMBINDEX, TROPONINI in the last 168 hours. BNP: Invalid input(s): POCBNP CBG: Recent Labs   Lab 03/20/18 2127 03/21/18 0257 03/21/18 0720 03/21/18 1236 03/21/18 1656  GLUCAP 348* 204* 137* 104* 137*   D-Dimer No results for input(s): DDIMER in the last 72 hours. Hgb A1c No results for input(s): HGBA1C in the last 72 hours. Lipid Profile No results for input(s): CHOL, HDL, LDLCALC, TRIG, CHOLHDL, LDLDIRECT in the last 72 hours. Thyroid function studies No results for input(s): TSH, T4TOTAL, T3FREE, THYROIDAB in the last 72 hours.  Invalid input(s): FREET3 Anemia work up No results for input(s): VITAMINB12, FOLATE, FERRITIN, TIBC, IRON, RETICCTPCT in the last 72 hours. Microbiology No results found for this or any previous visit (from the past 240 hour(s)). Pathology   Diagnosis 03/13/2018 Peritoneum, biopsy, RLQ cavity - HIGH GRADE B-CELL LYMPHOMA. - SEE COMMENT. Microscopic Comment Sections show needle core biopsy fragments of soft tissue displaying a dense and relatively monomorphic infiltrate of atypical medium to large lymphoid appearing cells displaying high nuclear cytoplasmic ratio, partially clumped to vesicular chromatin and single to multiple nucleoli associated with brisk mitosis, apoptosis and a starry sky pattern. The appearance is primarily diffuse with lack of atypical follicles or nodules. To further evaluate this process, flow cytometric analysis was performed ([id number]) and shows a monoclonal, kappa restricted B-cell population expressing pan B-cell antigens including CD20 associated with CD10 expression. No CD5 expression is identified. In addition, immunohistochemical stains were performed including BCL-2, BCL-6, CD10, CD20, CD3, CD30, CD34, LCA, CD5, CD79a, and TdT with appropriate controls. The lymphoid cells are diffusely positive for LCA and are predominantly composed of B cells as seen with CD20 and CD79a associated with CD10 and BCL-6 expression. There is also patchy, weak BCL-2 expression. No significant staining is seen with CD30, CD34 or  TdT and there is no apparent co-expression  of CD5 in B-cell areas. There is a minor T-cell population in the background as primarily highlighted with CD3. The overall findings are most consistent with high grade B-cell lymphoma. Based on the overall findings, the differential diagnosis includes diffuse large cell lymphoma, Burkitt lymphoma as well as Burkitt-like lymphoma. To evaluate for the presence of double or triple hit lymphoma, FISH studies will be performed on paraffin embedded tissue and the results will be reported separately. (BNS:ecj 03/17/2018)   Colon, biopsy, right tumor 03/15/2018 - ATYPICAL LYMPHOID PROLIFERATION CONSISTENT WITH NON-HODGKIN LYMPHOMA, SEE COMMENT.   Studies:  Dg Fluoro Guided Loc Of Needle/cath Tip For Spinal Inject Rt  Result Date: 03/21/2018 CLINICAL DATA:  62 year old male with history of high-grade B-cell lymphoma. EXAM: FLUOROSCOPICALLY GUIDED LUMBAR PUNCTURE FOR INTRATHECAL CHEMOTHERAPY TECHNIQUE: Informed consent was obtained from the patient prior to the procedure, including potential complications of headache, allergy, and pain. A 'time out' was performed. With the patient prone, the lower back was prepped with Betadine. 1% Lidocaine was used for local anesthesia. Lumbar puncture was performed at the L2-L3 level using a 20 gauge needle with return of clear CSF. A total of 8 mL of CSF was collected into sterile tubing and sent to the laboratory for testing and analysis per orders of the primary medical team. Subsequently, 5 mL of methotrexate was injected into the subarachnoid space. The patient tolerated the procedure well without apparent complication. FLUOROSCOPY TIME:  48 seconds IMPRESSION: 1. Intrathecal injection of chemotherapy without complication. Electronically Signed   By: Vinnie Langton M.D.   On: 03/21/2018 14:39    Assessment: 62 y.o. male with past medical history of diabetes and hyperlipidemia, presented with acute right lower extremity DVT,  and further work-up found submassive PE and a large abdominal mass.  1. 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 Right ascending colon mass, likely due to abdominal lymphoma direct invasion 3.  Submassive PE and right lower extremity DVT, on heparin drip 4.  Microcytic anemia, likely secondary to GI bleeding and iron deficiency, rule out bone marrow involvement from lymphoma 5. Type 2 diabetes  6. Obesity  7. Anemia, secondary to GI bleeding and iron deficient anemia, rule out lymphoma marrow involvement, he received blood transfusion 2u on 7/4, 1u on 7/5 and 1u on 7/8, iv feraheme on 7/4  8.  Thrombocytosis secondary to iron deficiency and malignancy 9. AKI probably secondary to diuretics, resolved  10.  Hyperuricemia secondary to chemo, resolved now    Plan:  -I discussed his bone marrow biopsy was negative for lymphoma, so his final lymphoma stage is II, if CSF (-), cytology is pending. -he is tolerating chemotherapy well, will complete cycle EPOCH, and he received cecal methotrexate today.  Plan to give Rituxan infusion tomorrow. -I encourage him to participate physical therapy, he needs PT evaluation to see if he needs rehab.  He is a significant right lower extremity edema and scrotal edema, he has not ambulated much since his admission. -lab reviewed, stable H/H  -Heparin SQ has been held since yesterday, I think it should be OK to restart tomorrow, 5000u every 8 hours to see if he can tolerates  -if he is able to ambulate independently, we may potentially discharge him on Thursday  -I will f/u closely    I spent a total of 30 mins in coordinate his care, >50% on face to face counseling.  Truitt Merle, MD 03/21/2018  9:26 PM

## 2018-03-22 ENCOUNTER — Inpatient Hospital Stay (HOSPITAL_COMMUNITY): Payer: BLUE CROSS/BLUE SHIELD

## 2018-03-22 DIAGNOSIS — N5089 Other specified disorders of the male genital organs: Secondary | ICD-10-CM

## 2018-03-22 LAB — GLUCOSE, CAPILLARY
GLUCOSE-CAPILLARY: 321 mg/dL — AB (ref 70–99)
Glucose-Capillary: 205 mg/dL — ABNORMAL HIGH (ref 70–99)
Glucose-Capillary: 214 mg/dL — ABNORMAL HIGH (ref 70–99)
Glucose-Capillary: 290 mg/dL — ABNORMAL HIGH (ref 70–99)

## 2018-03-22 LAB — CBC WITH DIFFERENTIAL/PLATELET
BASOS ABS: 0 10*3/uL (ref 0.0–0.1)
Basophils Relative: 0 %
EOS ABS: 0 10*3/uL (ref 0.0–0.7)
Eosinophils Relative: 0 %
HCT: 24.9 % — ABNORMAL LOW (ref 39.0–52.0)
HEMOGLOBIN: 8.1 g/dL — AB (ref 13.0–17.0)
LYMPHS PCT: 7 %
Lymphs Abs: 0.4 10*3/uL — ABNORMAL LOW (ref 0.7–4.0)
MCH: 27.8 pg (ref 26.0–34.0)
MCHC: 32.5 g/dL (ref 30.0–36.0)
MCV: 85.6 fL (ref 78.0–100.0)
Monocytes Absolute: 0 10*3/uL — ABNORMAL LOW (ref 0.1–1.0)
Monocytes Relative: 1 %
NEUTROS PCT: 92 %
Neutro Abs: 5.5 10*3/uL (ref 1.7–7.7)
PLATELETS: 243 10*3/uL (ref 150–400)
RBC: 2.91 MIL/uL — AB (ref 4.22–5.81)
RDW: 17 % — ABNORMAL HIGH (ref 11.5–15.5)
WBC: 5.9 10*3/uL (ref 4.0–10.5)

## 2018-03-22 LAB — URIC ACID: URIC ACID, SERUM: 4.2 mg/dL (ref 3.7–8.6)

## 2018-03-22 MED ORDER — SODIUM CHLORIDE 0.9% FLUSH: 10.0000 mL | INTRAVENOUS | Status: DC | PRN

## 2018-03-22 MED ORDER — HEPARIN SOD (PORK) LOCK FLUSH 100 UNIT/ML IV SOLN: 250.0000 [IU] | Freq: Once | INTRAVENOUS | Status: DC | PRN

## 2018-03-22 MED ORDER — ACETAMINOPHEN 325 MG PO TABS
650.0000 mg | ORAL_TABLET | Freq: Once | ORAL | Status: AC
  Administered 2018-03-22: 650 mg via ORAL
  Filled 2018-03-22: qty 2

## 2018-03-22 MED ORDER — HEPARIN SODIUM (PORCINE) 5000 UNIT/ML IJ SOLN
5000.0000 [IU] | Freq: Three times a day (TID) | INTRAMUSCULAR | Status: DC
Start: 2018-03-22 — End: 2018-03-23
  Administered 2018-03-22 – 2018-03-23 (×4): 5000 [IU] via SUBCUTANEOUS
  Filled 2018-03-22 (×4): qty 1

## 2018-03-22 MED ORDER — HEPARIN SOD (PORK) LOCK FLUSH 100 UNIT/ML IV SOLN: 500.0000 [IU] | Freq: Once | INTRAVENOUS | Status: DC | PRN

## 2018-03-22 MED ORDER — HOT PACK MISC ONCOLOGY
1.0000 | Freq: Once | Status: DC | PRN
  Filled 2018-03-22: qty 1

## 2018-03-22 MED ORDER — SODIUM CHLORIDE 0.9 % IV SOLN
375.0000 mg/m2 | Freq: Once | INTRAVENOUS | Status: AC
  Administered 2018-03-22: 900 mg via INTRAVENOUS
  Filled 2018-03-22: qty 50

## 2018-03-22 MED ORDER — SODIUM CHLORIDE 0.9% FLUSH: 3.0000 mL | INTRAVENOUS | Status: DC | PRN

## 2018-03-22 MED ORDER — IOPAMIDOL (ISOVUE-300) INJECTION 61%
INTRAVENOUS | Status: AC
  Filled 2018-03-22: qty 100

## 2018-03-22 MED ORDER — SODIUM CHLORIDE 0.9 % IV SOLN
INTRAVENOUS | Status: DC
  Administered 2018-03-22 – 2018-03-23 (×2): via INTRAVENOUS

## 2018-03-22 MED ORDER — IOPAMIDOL (ISOVUE-300) INJECTION 61%
100.0000 mL | Freq: Once | INTRAVENOUS | Status: AC | PRN
  Administered 2018-03-22: 100 mL via INTRAVENOUS

## 2018-03-22 MED ORDER — DIPHENHYDRAMINE HCL 50 MG PO CAPS
50.0000 mg | ORAL_CAPSULE | Freq: Once | ORAL | Status: AC
  Administered 2018-03-22: 50 mg via ORAL
  Filled 2018-03-22: qty 1

## 2018-03-22 MED ORDER — ALTEPLASE 2 MG IJ SOLR: 2.0000 mg | Freq: Once | INTRAMUSCULAR | Status: DC | PRN

## 2018-03-22 MED ORDER — COLD PACK MISC ONCOLOGY
1.0000 | Freq: Once | Status: DC | PRN
  Filled 2018-03-22: qty 1

## 2018-03-22 NOTE — Progress Notes (Addendum)
PROGRESS NOTE    Mark Frey   OTL:[id number]  DOB: October 26, 1955  DOA: 03/10/2018 PCP: [name], MD   Brief Narrative:  Mark Frey 62 y.o.male with a history of diabetes mellitus 2 that presented secondary to DVT in the right leg found on ultrasound.  > Right lower extremity is positive for acute deep vein thrombosis involving the right saphenofemoral junction, right common femoral vein, posterior tibial and peroneal veins. The right external and common iliac veins are patent with no obvious evidence of acute thrombosis. There is no evidence of right Baker's cyst   CTA of chest revealed: Acute bilateral pulmonary embolism involving lobar, segmental and subsegmental branches.  CT abd/pelvis revealed:  1. Large irregular infiltrative solid mass in the right lower quadrant measuring up to 18.7 x 18.5 x 17.8 cm, infiltrating and encasing multiple distal small bowel loops and likely the ileocecal region, partially encasing the sigmoid colon, with prominent extension into the right lower retroperitoneum and extraperitoneal right pelvis with encasement of right external iliac and proximal right common iliac vasculature and infiltration of the right iliopsoas muscle. 2. Mild right hydronephrosis due to thick soft tissue encasing the right ureter most prominently at the right UPJ and right UVJ. 3. Nonobstructing right nephrolithiasis. 4. Moderate sigmoid diverticulosis. 5.  Aortic Atherosclerosis (ICD10-I70.0).   Subjective: The pain in his right leg is improving. He last saw blood in his stool yesterday and feels it was old blood. No other complaints.    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    Controlled diabetes mellitus type II without complication - on Janumet, Amaryl - TID Novolog  Scrotal edema - ultrasound scrotum 1. Negative for testicular torsion or intratesticular mass 2. Bilateral hydroceles, small on the left and moderate on the right 3. Small right varicocele 4. Extensive scrotal edema and skin thickening  AKI - Cr rose to 1.66 and has improved to 0.76 with replacement of blood and hydration  DVT prophylaxis: Heparin Code Status: Full code Family Communication:  Disposition Plan:  D/c home tomorrow Consultants:   Oncology  GI  General surgery Procedures:   EGD/ Colonoscopy and biopsy 03/15/18 Antimicrobials:  Anti-infectives (From admission, onward)   Start     Dose/Rate Route Frequency Ordered Stop   03/17/18 1000  ceFAZolin (ANCEF) IVPB 2g/100 mL premix  Status:  Discontinued     2 g 200 mL/hr over 30 Minutes Intravenous To Radiology 03/16/18  1132 03/17/18 1010       Objective: Vitals:   03/22/18 1130 03/22/18 1154 03/22/18 1245 03/22/18 1305  BP: 127/75 124/77 137/87 137/74  Pulse: 92 86 90 74  Resp: 18 16 16 18   Temp: 97.8 F (36.6 C) 97.7 F (36.5 C) 98 F (36.7 C) 98.3 F (36.8 C)  TempSrc: Oral Oral Oral Oral  SpO2: 100% 98% 100% 98%  Weight:       Height:        Intake/Output Summary (Last 24 hours) at 03/22/2018 1354 Last data filed at 03/22/2018 1049 Gross per 24 hour  Intake 642.93 ml  Output 1550 ml  Net -907.07 ml   Filed Weights   03/15/18 1155 03/18/18 0230 03/18/18 0845  Weight: 117.9 kg (260 lb) 90.9 kg (200 lb 6.4 oz) 122.2 kg (269 lb 8 oz)    Examination: General exam: Appears comfortable  HEENT: PERRLA, oral mucosa moist, no sclera icterus or thrush Respiratory system: Clear to auscultation. Respiratory effort normal. Cardiovascular system: S1 & S2 heard, RRR.   Gastrointestinal system: Abdomen soft, non-tender, nondistended. Right lower abdominal mass felt. Normal bowel sound.  Central nervous system: Alert and oriented. No focal neurological deficits. Extremities: No cyanosis, clubbing - mild swelling of RLE   Skin: No rashes or ulcers Psychiatry:  Mood & affect appropriate.     Data Reviewed: I have personally reviewed following labs and imaging studies  CBC: Recent Labs  Lab 03/17/18 0618  03/19/18 0501 03/19/18 1755 03/20/18 0533 03/20/18 2056 03/21/18 0559 03/22/18 0500  WBC 12.3*  --  11.0*  --  7.0  --  6.2 5.9  NEUTROABS 9.9*  --   --   --  6.2  --  5.3 5.5  HGB 8.4*   < > 7.3* 7.6* 7.0* 8.2* 7.9* 8.1*  HCT 25.7*   < > 22.9* 23.7* 22.1* 24.9* 24.1* 24.9*  MCV 86.5  --  85.8  --  86.0  --  85.8 85.6  PLT 414*  --  346  --  324  --  271 243   < > = values in this interval not displayed.   Basic Metabolic Panel: Recent Labs  Lab 03/15/18 2113 03/17/18 0618 03/18/18 0653 03/19/18 0501 03/20/18 0533 03/21/18 0559  NA  --  136 136 138  --  138  K  --  4.4 4.9 4.8  --  4.5  CL  --  103 104 108  --  108  CO2  --  21* 22 23  --  25  GLUCOSE  --  148* 168* 187*  --  156*  BUN  --  33* 31* 36*  --  28*  CREATININE  --  1.66* 1.10 0.98  --  0.76  CALCIUM  --  8.1* 8.2* 8.1*  --  8.4*  MG 2.0  --  2.4 2.4 2.0  --   PHOS 1.9*  --   --   --   --   --    GFR: Estimated Creatinine  Clearance: 125.2 mL/min (by C-G formula based on SCr of 0.76 mg/dL). Liver Function Tests: Recent Labs  Lab 03/17/18 0618 03/18/18 0653 03/21/18 0559  AST 84* 37 17  ALT 51* 38 26  ALKPHOS 36* 39 36*  BILITOT 0.9 0.8 0.6  PROT 6.1* 6.0* 5.3*  ALBUMIN 2.7* 2.6* 2.5*   No results for input(s): LIPASE, AMYLASE in the last 168 hours. No results for input(s): AMMONIA in the last 168 hours. Coagulation Profile:  Recent Labs  Lab 03/17/18 0618  INR 1.22   Cardiac Enzymes: No results for input(s): CKTOTAL, CKMB, CKMBINDEX, TROPONINI in the last 168 hours. BNP (last 3 results) No results for input(s): PROBNP in the last 8760 hours. HbA1C: No results for input(s): HGBA1C in the last 72 hours. CBG: Recent Labs  Lab 03/21/18 1236 03/21/18 1656 03/21/18 2207 03/22/18 0711 03/22/18 1211  GLUCAP 104* 137* 334* 205* 214*   Lipid Profile: No results for input(s): CHOL, HDL, LDLCALC, TRIG, CHOLHDL, LDLDIRECT in the last 72 hours. Thyroid Function Tests: No results for input(s): TSH, T4TOTAL, FREET4, T3FREE, THYROIDAB in the last 72 hours. Anemia Panel: No results for input(s): VITAMINB12, FOLATE, FERRITIN, TIBC, IRON, RETICCTPCT in the last 72 hours. Urine analysis:    Component Value Date/Time   COLORURINE yellow 01/20/2009 0840   APPEARANCEUR Clear 01/20/2009 0840   LABSPEC 1.010 01/20/2009 0840   PHURINE 5.5 01/20/2009 0840   HGBUR negative 01/20/2009 0840   BILIRUBINUR Small 01/30/2018 1652   PROTEINUR Positive (A) 01/30/2018 1652   UROBILINOGEN 2.0 (A) 01/30/2018 1652   UROBILINOGEN 0.2 01/20/2009 0840   NITRITE Negative 01/30/2018 1652   NITRITE negative 01/20/2009 0840   LEUKOCYTESUR Negative 01/30/2018 1652   Sepsis Labs: @LABRCNTIP (procalcitonin:4,lacticidven:4) )No results found for this or any previous visit (from the past 240 hour(s)).       Radiology Studies: Dg Fluoro Guided Loc Of Needle/cath Tip For Spinal Inject Rt  Result Date: 03/21/2018 CLINICAL  DATA:  62 year old male with history of high-grade B-cell lymphoma. EXAM: FLUOROSCOPICALLY GUIDED LUMBAR PUNCTURE FOR INTRATHECAL CHEMOTHERAPY TECHNIQUE: Informed consent was obtained from the patient prior to the procedure, including potential complications of headache, allergy, and pain. A 'time out' was performed. With the patient prone, the lower back was prepped with Betadine. 1% Lidocaine was used for local anesthesia. Lumbar puncture was performed at the L2-L3 level using a 20 gauge needle with return of clear CSF. A total of 8 mL of CSF was collected into sterile tubing and sent to the laboratory for testing and analysis per orders of the primary medical team. Subsequently, 5 mL of methotrexate was injected into the subarachnoid space. The patient tolerated the procedure well without apparent complication. FLUOROSCOPY TIME:  48 seconds IMPRESSION: 1. Intrathecal injection of chemotherapy without complication. Electronically Signed   By: Vinnie Langton M.D.   On: 03/21/2018 14:39      Scheduled Meds: . allopurinol  300 mg Oral Daily  . docusate sodium  100 mg Oral BID  . fenofibrate  160 mg Oral Daily  . heparin injection (subcutaneous)  5,000 Units Subcutaneous Q8H  . insulin aspart  0-9 Units Subcutaneous TID WC  . methotrexate INTRATHECAL (+/- HYDROCORTISONE,Ara-C)   Intrathecal Once   Continuous Infusions: . sodium chloride 10 mL/hr at 03/20/18 0437  . sodium chloride 20 mL/hr at 03/22/18 1117     LOS: 12 days    Time spent in minutes: 35    Debbe Odea, MD [facility] Pager: [email or web address] Password [email or web address] 03/22/2018, 1:54 PM

## 2018-03-22 NOTE — Evaluation (Signed)
Physical Therapy Evaluation Patient Details Name: Mark Frey MRN: [id number] DOB: Mark Frey Today's Date: 03/22/2018   History of Present Illness  Patient is 62 y.o. male admittted 03/10/2018 with diagnosis of DM2 controlled. He was found to have an abdominal mass, Rt LE DVT and bilateral PEs. PMH: HLD, morbidly obese, HTN, anemia, B-cell lymphoma.    Clinical Impression  Patient ambulated with RW with good stability. Attempted ambulating with IV pole which was more difficult and uncomfortable for patient. Patient had been quite active and continued working PTA. Patient would like to return to PTA activity levels, therefore a bariatric Rollator walker would be the safest and most functional option for his activity level and physical size. Patient evaluated by Physical Therapy with no further acute PT needs identified. All education has been completed and the patient has no further questions. See below for any follow-up Physical Therapy or equipment needs. PT is signing off. Thank you for this referral.     Follow Up Recommendations No PT follow up    Equipment Recommendations  Other (comment)(bariatric Rollator)    Recommendations for Other Services       Precautions / Restrictions Restrictions Weight Bearing Restrictions: No      Mobility  Bed Mobility               General bed mobility comments: patient received in recliner and in recliner at end of session  Transfers Overall transfer level: Needs assistance Equipment used: Rolling walker (2 wheeled) Transfers: Sit to/from [employer] Transfers Sit to Stand: Supervision Stand pivot transfers: Supervision       General transfer comment: with verbal cuing for sequencing of steps and placement of hands as patient has never used a walker before.  Ambulation/Gait Ambulation/Gait assistance: Min guard Gait Distance (Feet): 300 Feet Assistive device: Rolling walker (2 wheeled) Gait Pattern/deviations:  Step-through pattern Gait velocity: minimally decr   General Gait Details: standard width RW too narrow for patient to ambulate within comfortably. Will require bariatric width  Stairs            Wheelchair Mobility    Modified Rankin (Stroke Patients Only)       Balance Overall balance assessment: Mild deficits observed, not formally tested                                           Pertinent Vitals/Pain Pain Assessment: No/denies pain    Home Living Family/patient expects to be discharged to:: Private residence Living Arrangements: Spouse/significant other Available Help at Discharge: Family;Available PRN/intermittently Type of Home: House Home Access: Stairs to enter Entrance Stairs-Rails: None Entrance Stairs-Number of Steps: 2 Home Layout: One level Home Equipment: Cane - single point      Prior Function Level of Independence: Independent               Hand Dominance   Dominant Hand: Right    Extremity/Trunk Assessment   Upper Extremity Assessment Upper Extremity Assessment: Defer to OT evaluation    Lower Extremity Assessment Lower Extremity Assessment: Overall WFL for tasks assessed    Cervical / Trunk Assessment Cervical / Trunk Assessment: Normal  Communication   Communication: No difficulties  Cognition Arousal/Alertness: Awake/alert Behavior During Therapy: WFL for tasks assessed/performed Overall Cognitive Status: Within Functional Limits for tasks assessed  General Comments      Exercises     Assessment/Plan    PT Assessment Patent does not need any further PT services  PT Problem List         PT Treatment Interventions      PT Goals (Current goals can be found in the Care Plan section)  Acute Rehab PT Goals Patient Stated Goal: go home and back to work being active again. PT Goal Formulation: With patient/family Time For Goal Achievement:  03/29/18 Potential to Achieve Goals: Good    Frequency     Barriers to discharge        Co-evaluation               AM-PAC PT "6 Clicks" Daily Activity  Outcome Measure Difficulty turning over in bed (including adjusting bedclothes, sheets and blankets)?: None Difficulty moving from lying on back to sitting on the side of the bed? : A Little Difficulty sitting down on and standing up from a chair with arms (e.g., wheelchair, bedside commode, etc,.)?: A Little Help needed moving to and from a bed to chair (including a wheelchair)?: A Little Help needed walking in hospital room?: A Little Help needed climbing 3-5 steps with a railing? : A Little 6 Click Score: 19    End of Session   Activity Tolerance: Patient tolerated treatment well Patient left: in chair;with call bell/phone within reach;with family/visitor present Nurse Communication: Mobility status PT Visit Diagnosis: Difficulty in walking, not elsewhere classified (R26.2)    Time: 9794-8016 PT Time Calculation (min) (ACUTE ONLY): 30 min   Charges:   PT Evaluation $PT Eval Moderate Complexity: 1 Mod PT Treatments $Gait Training: 8-22 mins   PT G Codes:        [name] D. Hartnett-Rands, MS, PT Per [place] [phone] 03/22/2018, 1:56 PM

## 2018-03-22 NOTE — Progress Notes (Signed)
Mark Frey   DOB:1956-04-04   QX#:[id number]   MKL#:[id number]  Oncology f/u note   Subjective: Pt started Rituxan this morning, has been tolerating well so far.  He walked in the hallway with a walker to the nurse station, right lower extremity edema has slightly improved, pain is mild to moderate, tolerable.  He had 2 normal looking, formed bowel movement, none today.  Heparin subcutaneous injection was restarted this morning.  Objective:  Vitals:   03/22/18 1245 03/22/18 1305  BP: 137/87 137/74  Pulse: 90 74  Resp: 16 18  Temp: 98 F (36.7 C) 98.3 F (36.8 C)  SpO2: 100% 98%    Body mass index is 39.8 kg/m.  Intake/Output Summary (Last 24 hours) at 03/22/2018 1406 Last data filed at 03/22/2018 1049 Gross per 24 hour  Intake 642.93 ml  Output 1550 ml  Net -907.07 ml     Sclerae unicteric  Oropharynx clear  No peripheral adenopathy  Lungs clear -- no rales or rhonchi  Heart regular rate and rhythm  Abdomen soft, mild tenderness at right side abdomen, there is a large palpable abdominal mass in the most of the right side abdomen.   MSK no focal spinal tenderness, (+) pitting edema on b/l LEs, R>L   Neuro nonfocal    CBG (last 3)  Recent Labs    03/21/18 2207 03/22/18 0711 03/22/18 1211  GLUCAP 334* 205* 214*     Labs:  Lab Results  Component Value Date   WBC 5.9 03/22/2018   HGB 8.1 (L) 03/22/2018   HCT 24.9 (L) 03/22/2018   MCV 85.6 03/22/2018   PLT 243 03/22/2018   NEUTROABS 5.5 03/22/2018    CMP Latest Ref Rng & Units 03/21/2018 03/19/2018 03/18/2018  Glucose 70 - 99 mg/dL 156(H) 187(H) 168(H)  BUN 8 - 23 mg/dL 28(H) 36(H) 31(H)  Creatinine 0.61 - 1.24 mg/dL 0.76 0.98 1.10  Sodium 135 - 145 mmol/L 138 138 136  Potassium 3.5 - 5.1 mmol/L 4.5 4.8 4.9  Chloride 98 - 111 mmol/L 108 108 104  CO2 22 - 32 mmol/L 25 23 22   Calcium 8.9 - 10.3 mg/dL 8.4(L) 8.1(L) 8.2(L)  Total Protein 6.5 - 8.1 g/dL 5.3(L) - 6.0(L)  Total Bilirubin 0.3 - 1.2 mg/dL 0.6 - 0.8   Alkaline Phos 38 - 126 U/L 36(L) - 39  AST 15 - 41 U/L 17 - 37  ALT 0 - 44 U/L 26 - 38     Urine Studies No results for input(s): UHGB, CRYS in the last 72 hours.  Invalid input(s): UACOL, UAPR, USPG, UPH, UTP, UGL, UKET, UBIL, UNIT, UROB, ULEU, UEPI, UWBC, URBC, [place], [place], [place], [place]  Basic Metabolic Panel: Recent Labs  Lab 03/15/18 2113  03/17/18 0618 03/18/18 0653 03/19/18 0501 03/20/18 0533 03/21/18 0559  NA  --   --  136 136 138  --  138  K  --    < > 4.4 4.9 4.8  --  4.5  CL  --   --  103 104 108  --  108  CO2  --   --  21* 22 23  --  25  GLUCOSE  --   --  148* 168* 187*  --  156*  BUN  --   --  33* 31* 36*  --  28*  CREATININE  --   --  1.66* 1.10 0.98  --  0.76  CALCIUM  --   --  8.1* 8.2* 8.1*  --  8.4*  MG 2.0  --   --  2.4 2.4 2.0  --   PHOS 1.9*  --   --   --   --   --   --    < > = values in this interval not displayed.   GFR Estimated Creatinine Clearance: 125.2 mL/min (by C-G formula based on SCr of 0.76 mg/dL). Liver Function Tests: Recent Labs  Lab 03/17/18 0618 03/18/18 0653 03/21/18 0559  AST 84* 37 17  ALT 51* 38 26  ALKPHOS 36* 39 36*  BILITOT 0.9 0.8 0.6  PROT 6.1* 6.0* 5.3*  ALBUMIN 2.7* 2.6* 2.5*   No results for input(s): LIPASE, AMYLASE in the last 168 hours. No results for input(s): AMMONIA in the last 168 hours. Coagulation profile Recent Labs  Lab 03/17/18 0618  INR 1.22    CBC: Recent Labs  Lab 03/17/18 0618  03/19/18 0501 03/19/18 1755 03/20/18 0533 03/20/18 2056 03/21/18 0559 03/22/18 0500  WBC 12.3*  --  11.0*  --  7.0  --  6.2 5.9  NEUTROABS 9.9*  --   --   --  6.2  --  5.3 5.5  HGB 8.4*   < > 7.3* 7.6* 7.0* 8.2* 7.9* 8.1*  HCT 25.7*   < > 22.9* 23.7* 22.1* 24.9* 24.1* 24.9*  MCV 86.5  --  85.8  --  86.0  --  85.8 85.6  PLT 414*  --  346  --  324  --  271 243   < > = values in this interval not displayed.   Cardiac Enzymes: No results for input(s): CKTOTAL, CKMB, CKMBINDEX, TROPONINI in the last 168  hours. BNP: Invalid input(s): POCBNP CBG: Recent Labs  Lab 03/21/18 1236 03/21/18 1656 03/21/18 2207 03/22/18 0711 03/22/18 1211  GLUCAP 104* 137* 334* 205* 214*   D-Dimer No results for input(s): DDIMER in the last 72 hours. Hgb A1c No results for input(s): HGBA1C in the last 72 hours. Lipid Profile No results for input(s): CHOL, HDL, LDLCALC, TRIG, CHOLHDL, LDLDIRECT in the last 72 hours. Thyroid function studies No results for input(s): TSH, T4TOTAL, T3FREE, THYROIDAB in the last 72 hours.  Invalid input(s): FREET3 Anemia work up No results for input(s): VITAMINB12, FOLATE, FERRITIN, TIBC, IRON, RETICCTPCT in the last 72 hours. Microbiology No results found for this or any previous visit (from the past 240 hour(s)). Pathology   Diagnosis 03/13/2018 Peritoneum, biopsy, RLQ cavity - HIGH GRADE B-CELL LYMPHOMA. - SEE COMMENT. Microscopic Comment Sections show needle core biopsy fragments of soft tissue displaying a dense and relatively monomorphic infiltrate of atypical medium to large lymphoid appearing cells displaying high nuclear cytoplasmic ratio, partially clumped to vesicular chromatin and single to multiple nucleoli associated with brisk mitosis, apoptosis and a starry sky pattern. The appearance is primarily diffuse with lack of atypical follicles or nodules. To further evaluate this process, flow cytometric analysis was performed ([id number]) and shows a monoclonal, kappa restricted B-cell population expressing pan B-cell antigens including CD20 associated with CD10 expression. No CD5 expression is identified. In addition, immunohistochemical stains were performed including BCL-2, BCL-6, CD10, CD20, CD3, CD30, CD34, LCA, CD5, CD79a, and TdT with appropriate controls. The lymphoid cells are diffusely positive for LCA and are predominantly composed of B cells as seen with CD20 and CD79a associated with CD10 and BCL-6 expression. There is also patchy, weak  BCL-2 expression. No significant staining is seen with CD30, CD34 or TdT and there is no apparent co-expression of CD5 in B-cell areas. There is a  minor T-cell population in the background as primarily highlighted with CD3. The overall findings are most consistent with high grade B-cell lymphoma. Based on the overall findings, the differential diagnosis includes diffuse large cell lymphoma, Burkitt lymphoma as well as Burkitt-like lymphoma. To evaluate for the presence of double or triple hit lymphoma, FISH studies will be performed on paraffin embedded tissue and the results will be reported separately. (BNS:ecj 03/17/2018)   Colon, biopsy, right tumor 03/15/2018 - ATYPICAL LYMPHOID PROLIFERATION CONSISTENT WITH NON-HODGKIN LYMPHOMA, SEE COMMENT.   Studies:  Dg Fluoro Guided Loc Of Needle/cath Tip For Spinal Inject Rt  Result Date: 03/21/2018 CLINICAL DATA:  62 year old male with history of high-grade B-cell lymphoma. EXAM: FLUOROSCOPICALLY GUIDED LUMBAR PUNCTURE FOR INTRATHECAL CHEMOTHERAPY TECHNIQUE: Informed consent was obtained from the patient prior to the procedure, including potential complications of headache, allergy, and pain. A 'time out' was performed. With the patient prone, the lower back was prepped with Betadine. 1% Lidocaine was used for local anesthesia. Lumbar puncture was performed at the L2-L3 level using a 20 gauge needle with return of clear CSF. A total of 8 mL of CSF was collected into sterile tubing and sent to the laboratory for testing and analysis per orders of the primary medical team. Subsequently, 5 mL of methotrexate was injected into the subarachnoid space. The patient tolerated the procedure well without apparent complication. FLUOROSCOPY TIME:  48 seconds IMPRESSION: 1. Intrathecal injection of chemotherapy without complication. Electronically Signed   By: Vinnie Langton M.D.   On: 03/21/2018 14:39    Assessment: 62 y.o. male with past medical history of diabetes  and hyperlipidemia, presented with acute right lower extremity DVT, and further work-up found submassive PE and a large abdominal mass.  1. 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 Right ascending colon mass, likely due to abdominal lymphoma direct invasion 3.  Submassive PE and right lower extremity DVT, on heparin drip 4.  Microcytic anemia, likely secondary to GI bleeding and iron deficiency, rule out bone marrow involvement from lymphoma 5. Type 2 diabetes  6. Obesity  7. Anemia, secondary to GI bleeding and iron deficient anemia, rule out lymphoma marrow involvement, he received blood transfusion 2u on 7/4, 1u on 7/5 and 1u on 7/8, iv feraheme on 7/4  8.  Thrombocytosis secondary to iron deficiency and malignancy 9. AKI probably secondary to diuretics, resolved  10.  Hyperuricemia secondary to chemo, resolved now    Plan:  -he is tolerating Rituxan well so far, will complete his first cycle chemotherapy later today. -Heparin subcutaneous injection has restarted today, he has not had overt GI bleeding in the past 3-4 days since his heparin drip stopped.  -We again discussed the risk of worsening thrombosis, especially the possibility of thrombosis traveling from his RLE to his lungs which can cause serious lung damage and consequence.  Due to the recurrent GI  Bleeding from lymphoma, he has not been able to get the full dose of anticoagulation. -I discussed with IR Dr. Laurence Ferrari about IVC filter vs thrombectomy with Angiovac. He feels pt's thrombus burden in the right lower extremity is not very high, may not be a candidate for angiovac, his significant right lower extremity edema may be partially related to the tumor compression to IVC.  He will evaluate the patient for possible stenting and or IVC filter, IR PA will see pt first  -I will f/u closely    I spent a total of 30 mins in  coordinate his care, >50% on face to face counseling.  Truitt Merle, MD 03/22/2018  2:06 PM

## 2018-03-23 ENCOUNTER — Other Ambulatory Visit: Payer: Self-pay | Admitting: Hematology

## 2018-03-23 LAB — CBC WITH DIFFERENTIAL/PLATELET
BASOS ABS: 0 10*3/uL (ref 0.0–0.1)
Basophils Relative: 0 %
EOS ABS: 0.3 10*3/uL (ref 0.0–0.7)
EOS PCT: 7 %
HCT: 23.4 % — ABNORMAL LOW (ref 39.0–52.0)
Hemoglobin: 7.6 g/dL — ABNORMAL LOW (ref 13.0–17.0)
Lymphocytes Relative: 11 %
Lymphs Abs: 0.4 10*3/uL — ABNORMAL LOW (ref 0.7–4.0)
MCH: 27.9 pg (ref 26.0–34.0)
MCHC: 32.5 g/dL (ref 30.0–36.0)
MCV: 86 fL (ref 78.0–100.0)
Monocytes Absolute: 0 10*3/uL — ABNORMAL LOW (ref 0.1–1.0)
Monocytes Relative: 0 %
Neutro Abs: 3 10*3/uL (ref 1.7–7.7)
Neutrophils Relative %: 82 %
PLATELETS: 147 10*3/uL — AB (ref 150–400)
RBC: 2.72 MIL/uL — AB (ref 4.22–5.81)
RDW: 17.6 % — ABNORMAL HIGH (ref 11.5–15.5)
WBC: 3.6 10*3/uL — AB (ref 4.0–10.5)

## 2018-03-23 LAB — CBC
HCT: 24.2 % — ABNORMAL LOW (ref 39.0–52.0)
HEMOGLOBIN: 8 g/dL — AB (ref 13.0–17.0)
MCH: 28.5 pg (ref 26.0–34.0)
MCHC: 33.1 g/dL (ref 30.0–36.0)
MCV: 86.1 fL (ref 78.0–100.0)
PLATELETS: 137 10*3/uL — AB (ref 150–400)
RBC: 2.81 MIL/uL — AB (ref 4.22–5.81)
RDW: 17.4 % — ABNORMAL HIGH (ref 11.5–15.5)
WBC: 3.5 10*3/uL — AB (ref 4.0–10.5)

## 2018-03-23 LAB — GLUCOSE, CAPILLARY
GLUCOSE-CAPILLARY: 149 mg/dL — AB (ref 70–99)
GLUCOSE-CAPILLARY: 218 mg/dL — AB (ref 70–99)
GLUCOSE-CAPILLARY: 217 mg/dL — AB (ref 70–99)
Glucose-Capillary: 159 mg/dL — ABNORMAL HIGH (ref 70–99)

## 2018-03-23 LAB — HEPARIN LEVEL (UNFRACTIONATED): Heparin Unfractionated: 0.73 IU/mL — ABNORMAL HIGH (ref 0.30–0.70)

## 2018-03-23 LAB — URIC ACID: URIC ACID, SERUM: 3.9 mg/dL (ref 3.7–8.6)

## 2018-03-23 MED ORDER — HEPARIN (PORCINE) IN NACL 100-0.45 UNIT/ML-% IJ SOLN
2600.0000 [IU]/h | INTRAMUSCULAR | Status: DC
  Administered 2018-03-23: 2600 [IU]/h via INTRAVENOUS
  Filled 2018-03-23 (×2): qty 250

## 2018-03-23 MED ORDER — FERUMOXYTOL INJECTION 510 MG/17 ML
510.0000 mg | Freq: Once | INTRAVENOUS | Status: AC
  Administered 2018-03-23: 510 mg via INTRAVENOUS
  Filled 2018-03-23: qty 17

## 2018-03-23 MED ORDER — HEPARIN (PORCINE) IN NACL 100-0.45 UNIT/ML-% IJ SOLN
2200.0000 [IU]/h | INTRAMUSCULAR | Status: DC
  Administered 2018-03-24: 2200 [IU]/h via INTRAVENOUS
  Filled 2018-03-23 (×2): qty 250

## 2018-03-23 NOTE — Evaluation (Signed)
Occupational Therapy Evaluation Patient Details Name: Mark Frey MRN: [id number] DOB: 04/01/1956 Today's Date: 03/23/2018    History of Present Illness Patient is 62 y.o. male admittted 03/10/2018 with diagnosis of DM2 controlled. He was found to have an abdominal mass, Rt LE DVT and bilateral PEs. PMH: HLD, morbidly obese, HTN, anemia, B-cell lymphoma.   Clinical Impression   Pt admitted with the above diagnoses and presents with below problem list. Pt will benefit from continued acute OT to address the below listed deficits and maximize independence with basic ADLs prior to d/c home. PTA pt was independent with ADLs. Pt is currently supervision to min guard with OOB/LB ADLs and functional transfers/mobility. Educated on energy conservation strategies and techniques/AE for LB ADLs with edema. Spouse present throughout session.       Follow Up Recommendations  No OT follow up;Supervision - Intermittent    Equipment Recommendations  3 in 1 bedside commode(needs bariatric width)    Recommendations for Other Services       Precautions / Restrictions Restrictions Weight Bearing Restrictions: No      Mobility Bed Mobility Overal bed mobility: Modified Independent                Transfers Overall transfer level: Needs assistance Equipment used: Rolling walker (2 wheeled) Transfers: Sit to/from Stand Sit to Stand: Supervision              Balance Overall balance assessment: Mild deficits observed, not formally tested                                         ADL either performed or assessed with clinical judgement   ADL Overall ADL's : Needs assistance/impaired Eating/Feeding: Set up;Sitting   Grooming: Set up;Sitting;Standing   Upper Body Bathing: Set up;Sitting   Lower Body Bathing: Min guard;Sit to/from stand   Upper Body Dressing : Set up;Sitting   Lower Body Dressing: Min guard;Sit to/from stand   Toilet Transfer:  Supervision/safety;Min guard;Ambulation;RW;BSC;Requires wide/bariatric   Toileting- Water quality scientist and Hygiene: Min guard;Sit to/from stand   Tub/ Shower Transfer: Walk-in shower;Min guard;Ambulation;Rolling walker   Functional mobility during ADLs: Min guard;Rolling walker General ADL Comments: Pt completed in room functional mobility. Discussed energy conservation strategies.      Vision         Perception     Praxis      Pertinent Vitals/Pain Pain Assessment: Faces Faces Pain Scale: No hurt     Hand Dominance Right   Extremity/Trunk Assessment Upper Extremity Assessment Upper Extremity Assessment: Overall WFL for tasks assessed   Lower Extremity Assessment Lower Extremity Assessment: Defer to PT evaluation   Cervical / Trunk Assessment Cervical / Trunk Assessment: Normal   Communication Communication Communication: No difficulties   Cognition Arousal/Alertness: Awake/alert Behavior During Therapy: WFL for tasks assessed/performed Overall Cognitive Status: Within Functional Limits for tasks assessed                                     General Comments       Exercises     Shoulder Instructions      Home Living Family/patient expects to be discharged to:: Private residence Living Arrangements: Spouse/significant other Available Help at Discharge: Family;Available PRN/intermittently Type of Home: House Home Access: Stairs to enter [employer] of Steps: 2 Entrance  Stairs-Rails: None Home Layout: One level     Bathroom Shower/Tub: Walk-in Hydrologist: Standard     Home Equipment: Cane - single point          Prior Functioning/Environment Level of Independence: Independent                 OT Problem List: Decreased activity tolerance;Impaired balance (sitting and/or standing);Decreased knowledge of use of DME or AE;Decreased knowledge of precautions;Pain;Obesity      OT  Treatment/Interventions: Self-care/ADL training;Therapeutic exercise;Energy conservation;DME and/or AE instruction;Therapeutic activities;Patient/family education;Balance training    OT Goals(Current goals can be found in the care plan section) Acute Rehab OT Goals Patient Stated Goal: go home and back to work being active again. OT Goal Formulation: With patient/family Time For Goal Achievement: 03/30/18 Potential to Achieve Goals: Good ADL Goals Pt Will Perform Grooming: with modified independence;standing;sitting Pt Will Perform Lower Body Bathing: with modified independence;sit to/from stand Pt Will Perform Lower Body Dressing: with modified independence;sit to/from stand Additional ADL Goal #1: Pt will independently incorporate 1 energy conservation strategy into ADL task.  OT Frequency: Min 2X/week   Barriers to D/C:            Co-evaluation              AM-PAC PT "6 Clicks" Daily Activity     Outcome Measure Help from another person eating meals?: None Help from another person taking care of personal grooming?: None Help from another person toileting, which includes using toliet, bedpan, or urinal?: A Little Help from another person bathing (including washing, rinsing, drying)?: A Little Help from another person to put on and taking off regular upper body clothing?: None Help from another person to put on and taking off regular lower body clothing?: A Little 6 Click Score: 21   End of Session Equipment Utilized During Treatment: Rolling walker  Activity Tolerance: Patient tolerated treatment well Patient left: in bed;with call bell/phone within reach;with family/visitor present  OT Visit Diagnosis: Unsteadiness on feet (R26.81);Pain                Time: 1048-1110 OT Time Calculation (min): 22 min Charges:  OT General Charges $OT Visit: 1 Visit OT Evaluation $OT Eval Low Complexity: 1 Low G-Codes:       Hortencia Pilar 03/23/2018, 11:25 AM

## 2018-03-23 NOTE — Progress Notes (Signed)
PROGRESS NOTE    Mark Frey   HRC:[id number]  DOB: 21-Feb-1956  DOA: 03/10/2018 PCP: [name], MD   Brief Narrative:  Mark Frey 62 y.o.male with a history of diabetes mellitus 2 that presented secondary to DVT in the right leg found on ultrasound.  > Right lower extremity is positive for acute deep vein thrombosis involving the right saphenofemoral junction, right common femoral vein, posterior tibial and peroneal veins. The right external and common iliac veins are patent with no obvious evidence of acute thrombosis. There is no evidence of right Baker's cyst   CTA of chest revealed: Acute bilateral pulmonary embolism involving lobar, segmental and subsegmental branches.  CT abd/pelvis revealed:  1. Large irregular infiltrative solid mass in the right lower quadrant measuring up to 18.7 x 18.5 x 17.8 cm, infiltrating and encasing multiple distal small bowel loops and likely the ileocecal region, partially encasing the sigmoid colon, with prominent extension into the right lower retroperitoneum and extraperitoneal right pelvis with encasement of right external iliac and proximal right common iliac vasculature and infiltration of the right iliopsoas muscle. 2. Mild right hydronephrosis due to thick soft tissue encasing the right ureter most prominently at the right UPJ and right UVJ. 3. Nonobstructing right nephrolithiasis. 4. Moderate sigmoid diverticulosis. 5.  Aortic Atherosclerosis (ICD10-I70.0).   Subjective: No complaints today.    Assessment & Plan:   Principal Problem:   Bilateral pulmonary embolism     Acute deep vein thrombosis (DVT) of femoral vein of right lower extremity   - due to GI bleed, Heparin infusion stopped and patient placed on s/c Heparin - discussed long term treatment plan with Dr Burr Medico- she requested a venogram shows: "the infiltrative mass in the right lower quadrant has largely improved- right hydronephrosis has also nearly  resolved- persistent compression of the right common and external iliac veins due to residual retroperitoneal hemorrhage- Circumferential mass in the terminal ileum or cecum worrisome for adenocarcinoma or lymphoma." - Dr Burr Medico recommends resuming Heparin infusion and monitoring for further GI bleeding - TEDs     High grade B-cell lymphoma with bleeding- see above - s/p EGD and Colonoscopy on 7/3-  - found to have right abdominal mass infiltrating the proximal colon, mucosa was friable - biopsied>> found to have high grade Non Hodgkin's lymphoma - developed rectal bleeding with clots on 7/5- heparin was stopped - chemo with EPOCH started on 7/5 with hopes to shrink mass and prevent rebleeding - cont allopurinol  Anemia due to acute blood loss - has received 4 U PRBC so far- Hb has been stable - Heparin infusion stopped as mentioned above, patient has been on S/c Heparin and will now be transitioned to a Heparin infusion - follow for further bleeding    Morbid obesity Body mass index is 39.8 kg/m.    Controlled diabetes mellitus type II without complication - on Janumet, Amaryl - TID Novolog  Scrotal edema - ultrasound scrotum 1. Negative for testicular torsion or intratesticular mass 2. Bilateral hydroceles, small on the left and moderate on the right 3. Small right varicocele 4. Extensive scrotal edema and skin thickening  AKI - Cr rose to 1.66 and has improved to 0.76 with replacement of blood and hydration  DVT prophylaxis: Heparin Code Status: Full code Family Communication:  Disposition Plan:    Consultants:   Oncology  GI  General surgery Procedures:   EGD/ Colonoscopy and biopsy 03/15/18 Antimicrobials:  Anti-infectives (From admission, onward)   Start  Dose/Rate Route Frequency Ordered Stop   03/17/18 1000  ceFAZolin (ANCEF) IVPB 2g/100 mL premix  Status:  Discontinued     2 g 200 mL/hr over 30 Minutes Intravenous To Radiology 03/16/18 1132 03/17/18 1010         Objective: Vitals:   03/22/18 1305 03/22/18 1655 03/22/18 2131 03/23/18 0540  BP: 137/74 104/60 124/78 128/72  Pulse: 74 89 95 97  Resp: 18 18 18 18   Temp: 98.3 F (36.8 C) 98.4 F (36.9 C) 98.6 F (37 C) 98.3 F (36.8 C)  TempSrc: Oral Oral Oral Oral  SpO2: 98% 100% 98% 98%  Weight:      Height:        Intake/Output Summary (Last 24 hours) at 03/23/2018 1359 Last data filed at 03/23/2018 1300 Gross per 24 hour  Intake 724.33 ml  Output 1725 ml  Net -1000.67 ml   Filed Weights   03/15/18 1155 03/18/18 0230 03/18/18 0845  Weight: 117.9 kg (260 lb) 90.9 kg (200 lb 6.4 oz) 122.2 kg (269 lb 8 oz)    Examination: General exam: Appears comfortable  HEENT: PERRLA, oral mucosa moist, no sclera icterus or thrush Respiratory system: Clear to auscultation. Respiratory effort normal. Cardiovascular system: S1 & S2 heard, RRR.   Gastrointestinal system: Abdomen soft, non-tender, nondistended. Right lower abdominal mass felt. Normal bowel sound.  Central nervous system: Alert and oriented. No focal neurological deficits. Extremities: No cyanosis, clubbing - mild swelling of RLE   Skin: No rashes or ulcers Psychiatry:  Mood & affect appropriate.     Data Reviewed: I have personally reviewed following labs and imaging studies  CBC: Recent Labs  Lab 03/17/18 0618  03/20/18 0533 03/20/18 2056 03/21/18 0559 03/22/18 0500 03/23/18 0500 03/23/18 1121  WBC 12.3*   < > 7.0  --  6.2 5.9 3.6* 3.5*  NEUTROABS 9.9*  --  6.2  --  5.3 5.5 3.0  --   HGB 8.4*   < > 7.0* 8.2* 7.9* 8.1* 7.6* 8.0*  HCT 25.7*   < > 22.1* 24.9* 24.1* 24.9* 23.4* 24.2*  MCV 86.5   < > 86.0  --  85.8 85.6 86.0 86.1  PLT 414*   < > 324  --  271 243 147* 137*   < > = values in this interval not displayed.   Basic Metabolic Panel: Recent Labs  Lab 03/17/18 0618 03/18/18 0653 03/19/18 0501 03/20/18 0533 03/21/18 0559  NA 136 136 138  --  138  K 4.4 4.9 4.8  --  4.5  CL 103 104 108  --  108   CO2 21* 22 23  --  25  GLUCOSE 148* 168* 187*  --  156*  BUN 33* 31* 36*  --  28*  CREATININE 1.66* 1.10 0.98  --  0.76  CALCIUM 8.1* 8.2* 8.1*  --  8.4*  MG  --  2.4 2.4 2.0  --    GFR: Estimated Creatinine Clearance: 125.2 mL/min (by C-G formula based on SCr of 0.76 mg/dL). Liver Function Tests: Recent Labs  Lab 03/17/18 0618 03/18/18 0653 03/21/18 0559  AST 84* 37 17  ALT 51* 38 26  ALKPHOS 36* 39 36*  BILITOT 0.9 0.8 0.6  PROT 6.1* 6.0* 5.3*  ALBUMIN 2.7* 2.6* 2.5*   No results for input(s): LIPASE, AMYLASE in the last 168 hours. No results for input(s): AMMONIA in the last 168 hours. Coagulation Profile: Recent Labs  Lab 03/17/18 0618  INR 1.22   Cardiac Enzymes: No  results for input(s): CKTOTAL, CKMB, CKMBINDEX, TROPONINI in the last 168 hours. BNP (last 3 results) No results for input(s): PROBNP in the last 8760 hours. HbA1C: No results for input(s): HGBA1C in the last 72 hours. CBG: Recent Labs  Lab 03/22/18 1211 03/22/18 1724 03/22/18 2127 03/23/18 0727 03/23/18 1206  GLUCAP 214* 290* 321* 149* 218*   Lipid Profile: No results for input(s): CHOL, HDL, LDLCALC, TRIG, CHOLHDL, LDLDIRECT in the last 72 hours. Thyroid Function Tests: No results for input(s): TSH, T4TOTAL, FREET4, T3FREE, THYROIDAB in the last 72 hours. Anemia Panel: No results for input(s): VITAMINB12, FOLATE, FERRITIN, TIBC, IRON, RETICCTPCT in the last 72 hours. Urine analysis:    Component Value Date/Time   COLORURINE yellow 01/20/2009 0840   APPEARANCEUR Clear 01/20/2009 0840   LABSPEC 1.010 01/20/2009 0840   PHURINE 5.5 01/20/2009 0840   HGBUR negative 01/20/2009 0840   BILIRUBINUR Small 01/30/2018 1652   PROTEINUR Positive (A) 01/30/2018 1652   UROBILINOGEN 2.0 (A) 01/30/2018 1652   UROBILINOGEN 0.2 01/20/2009 0840   NITRITE Negative 01/30/2018 1652   NITRITE negative 01/20/2009 0840   LEUKOCYTESUR Negative 01/30/2018 1652   Sepsis  Labs: @LABRCNTIP (procalcitonin:4,lacticidven:4) )No results found for this or any previous visit (from the past 240 hour(s)).       Radiology Studies: Ct Venogram Abd/pel  Result Date: 03/23/2018 CLINICAL DATA:  Right lower extremity DVT EXAM: CT VENOGRAM ABD-PELVIS TECHNIQUE: Multidetector CT imaging of the abdomen and pelvis was performed using the standard protocol during bolus administration of intravenous contrast. Multiplanar reconstructed images and MIPs were obtained and reviewed to evaluate the venous anatomy. CONTRAST:  124m ISOVUE-300 IOPAMIDOL (ISOVUE-300) INJECTION 61% COMPARISON:  03/10/2018 FINDINGS: VASCULAR Veins: Hepatic veins, portal vein, and splenic vein are patent. Superior mesenteric vein is patent. IVC is patent.  Renal veins are patent. Left common and external iliac veins are patent. The right common and external iliac veins are patent. There is compression of the right common and external iliac veins secondary to residual retroperitoneal hemorrhage and/or infiltrative mass. There is low-density filling defect in the right common femoral vein compatible with DVT. The right femoral vein is grossly patent. No evidence of left common femoral or femoral DVT. Review of the MIP images confirms the above findings. NON-VASCULAR Lower chest: Bibasilar atelectasis for scarring. Hepatobiliary: Liver is unremarkable.  Gallbladder is decompressed. Pancreas: Unremarkable Spleen: Unremarkable Adrenals/Urinary Tract: Nonspecific hypodensity in the mid right kidney. Left kidney is within normal limits. Right hydronephrosis has nearly resolved. Small calculus in the posterior lower pole of the right kidney is unchanged. Stomach/Bowel: Severe wall thickening of a loop of bowel in the right lower quadrant either represents the terminal ileum or cecum. Wall thickening is very nodular and this is worrisome for adenocarcinoma or lymphoma. There is no evidence of small-bowel obstruction. The appendix  is not readily visualized. There is no definite extraluminal bowel gas to suggest perforation. Stomach and duodenum are unremarkable. Lymphatic: There is no abnormal retroperitoneal adenopathy. Mild atherosclerotic calcification of the aorta without aneurysm. Reproductive: Prostate is unremarkable other than calcifications. Other: On the prior study, there was ill-defined and infiltrative soft tissue density throughout the right lower quadrant of the abdomen and extending into the right iliopsoas musculature and right inguinal region. This soft tissue density has largely resolved with some residual stranding throughout the retroperitoneum. The large infiltrative mass is now simply replaced by stranding and ill-defined bowel loops. Expansion of the right iliopsoas musculature has also improved. There is stranding throughout the musculature of the  proximal right thigh which is stable. Due to the dramatic improvement of the infiltrative mass, this may represent a dramatic response to chemotherapy if this correlates with the given history. Alternatively, the ill-defined mass may represent hemorrhage which has evolved. Musculoskeletal: No vertebral compression deformity. IMPRESSION: VASCULAR There is no evidence of DVT in the abdomen or pelvis. Specifically, there is no evidence of right common or external iliac vein DVT. The study is positive for DVT in the right common femoral vein. NON-VASCULAR The large infiltrative mass in the right lower quadrant has largely improved. The dramatic improvement either represents a rapid response to chemotherapy or possibly evolving hemorrhage. Correlate clinically with vital signs and hemoglobin levels. The right hydronephrosis has also nearly resolved. There is persistent compression of the right common and external iliac veins due to residual retroperitoneal hemorrhage. Circumferential mass in the terminal ileum or cecum worrisome for adenocarcinoma or lymphoma. It is conceivable  that the wall thickening may be related to hemorrhage in the wall of bowel, but it is rather nodular suggesting neoplasm. Electronically Signed   By: Marybelle Killings M.D.   On: 03/23/2018 08:17      Scheduled Meds: . allopurinol  300 mg Oral Daily  . docusate sodium  100 mg Oral BID  . fenofibrate  160 mg Oral Daily  . insulin aspart  0-9 Units Subcutaneous TID WC  . methotrexate INTRATHECAL (+/- HYDROCORTISONE,Ara-C)   Intrathecal Once   Continuous Infusions: . sodium chloride 10 mL/hr at 03/20/18 0437  . sodium chloride 20 mL/hr at 03/22/18 1117     LOS: 13 days    Time spent in minutes: 35    Debbe Odea, MD [facility] Pager: [email or web address] Password [facility] 03/23/2018, 1:59 PM

## 2018-03-23 NOTE — Progress Notes (Signed)
ANTICOAGULATION CONSULT NOTE - Consult  Pharmacy Consult for IV Heparin Indication: pulmonary embolus  Allergies  Allergen Reactions  . Ciprofloxacin Other (See Comments)    Leg tingling    Patient Measurements: Height: 5' 9" (175.3 cm) Weight: 269 lb 8 oz (122.2 kg) IBW/kg (Calculated) : 70.7 Heparin Dosing Weight:   Vital Signs: Temp: 98.9 F (37.2 C) (07/11 2228) Temp Source: Oral (07/11 2228) BP: 133/74 (07/11 2228) Pulse Rate: 98 (07/11 2228)  Labs: Recent Labs    03/21/18 0559 03/22/18 0500 03/23/18 0500 03/23/18 1121 03/23/18 2212  HGB 7.9* 8.1* 7.6* 8.0*  --   HCT 24.1* 24.9* 23.4* 24.2*  --   PLT 271 243 147* 137*  --   HEPARINUNFRC  --   --   --   --  0.73*  CREATININE 0.76  --   --   --   --     Estimated Creatinine Clearance: 125.2 mL/min (by C-G formula based on SCr of 0.76 mg/dL).   Medical History: Past Medical History:  Diagnosis Date  . ALLERGIC RHINITIS   . Diabetes mellitus   . Hyperlipidemia     Medications:  Scheduled:  . allopurinol  300 mg Oral Daily  . docusate sodium  100 mg Oral BID  . fenofibrate  160 mg Oral Daily  . insulin aspart  0-9 Units Subcutaneous TID WC  . methotrexate INTRATHECAL (+/- HYDROCORTISONE,Ara-C)   Intrathecal Once     Assessment: 88 yoM went to his PCP office on 6/27 with c/o right leg edema and was sent home on xarelto 15 mg bid pending LE doppler.  Outpatient LE doppler showed acute right DVT involving the right saphenofemoral junction, right common femoral vein, posterior tibial and peroneal veins. He was sent to the ED on 6/28 for further workup and management of thromboembolism. Chest CTA on 6/28 showed acute bilateral PE with evidence of right heart strain and abd CT showed abdominal mass. Oncology consulted for intra-abdominal tumor. Heparin started on admission for acute DVT/PE.  Note hemoccult positive on admission.  Significant events: 7/1 - heparin off at 0800 (6 hrs) prior to IR biopsy.  Resumed 4 hrs post procedure at 2100 7/3 - heparin held at 0700 prior to colonoscopy. 7/3 - Colonoscopy revealed large, friable, ulcerated mass of the right colon.  GI recommended be careful with anticoagulation, recommended heparin for now pending decision re: plan of care.  Dr. Ardis Hughs ok with resuming heparin at this time but will shoot for lower end therapeutic goal 7/4 - heparin drip stopped ~7P due to falling Hgb and bloody BM; transfused 2 units PRBC 7/5 - Underwent CT guided bone marrow biopsy and port-a-cath insertion. Hgb improved, heparin to be resumed with target low end of therapeutic range per Dr. Wyline Copas.  Heparin level was at goal yesterday on 2600 units/hr. 7/11 - Restarting heparin. Will aim for lower target range of 0.3 to 0.5 due to previous bleed during this admission 2212 HL= 0.73 at high end of therapeutic, no bleeding or infusion issues per RN.  Goal of Therapy:  Heparin level 0.3- 0.5 due to previous bleeding during admission Monitor platelets by anticoagulation protocol: Yes   Plan:   Will decrease heparin drip to 2200 units/hr  Recheck HL at 0600   Daily CBC     Dorrene German 03/23/2018 11:53 PM

## 2018-03-23 NOTE — Progress Notes (Signed)
Mark Frey   DOB:1956/07/10   DJ#:[id number]   HYI#:[id number]  Oncology f/u note   Subjective: Pt tolerated very well Rituxan yesterday without significant reactions.  He had a normal bowel movement yesterday afternoon, and this morning.  No bleeding noticed.  Heparin subcutaneous injection was restarted yesterday.  He did ambulate in the hallway today, leg pain and swelling continues to improve.  Afebrile, no other complaints.   Objective:  Vitals:   03/23/18 0540 03/23/18 1550  BP: 128/72 (!) 157/95  Pulse: 97 98  Resp: 18 17  Temp: 98.3 F (36.8 C) 98.2 F (36.8 C)  SpO2: 98% 98%    Body mass index is 39.8 kg/m.  Intake/Output Summary (Last 24 hours) at 03/23/2018 1718 Last data filed at 03/23/2018 1630 Gross per 24 hour  Intake 1124.33 ml  Output 1725 ml  Net -600.67 ml     Sclerae unicteric  Oropharynx clear  No peripheral adenopathy  Lungs clear -- no rales or rhonchi  Heart regular rate and rhythm  Abdomen soft, mild tenderness at right side abdomen, there is a large palpable abdominal mass in the most of the right side abdomen, which appears to be smaller now.   MSK no focal spinal tenderness, (+) pitting edema on b/l LEs, R>L   Neuro nonfocal    CBG (last 3)  Recent Labs    03/23/18 0727 03/23/18 1206 03/23/18 1648  GLUCAP 149* 218* 159*     Labs:  Lab Results  Component Value Date   WBC 3.5 (L) 03/23/2018   HGB 8.0 (L) 03/23/2018   HCT 24.2 (L) 03/23/2018   MCV 86.1 03/23/2018   PLT 137 (L) 03/23/2018   NEUTROABS 3.0 03/23/2018    CMP Latest Ref Rng & Units 03/21/2018 03/19/2018 03/18/2018  Glucose 70 - 99 mg/dL 156(H) 187(H) 168(H)  BUN 8 - 23 mg/dL 28(H) 36(H) 31(H)  Creatinine 0.61 - 1.24 mg/dL 0.76 0.98 1.10  Sodium 135 - 145 mmol/L 138 138 136  Potassium 3.5 - 5.1 mmol/L 4.5 4.8 4.9  Chloride 98 - 111 mmol/L 108 108 104  CO2 22 - 32 mmol/L 25 23 22   Calcium 8.9 - 10.3 mg/dL 8.4(L) 8.1(L) 8.2(L)  Total Protein 6.5 - 8.1 g/dL 5.3(L) - 6.0(L)   Total Bilirubin 0.3 - 1.2 mg/dL 0.6 - 0.8  Alkaline Phos 38 - 126 U/L 36(L) - 39  AST 15 - 41 U/L 17 - 37  ALT 0 - 44 U/L 26 - 38     Urine Studies No results for input(s): UHGB, CRYS in the last 72 hours.  Invalid input(s): UACOL, UAPR, USPG, UPH, UTP, UGL, UKET, UBIL, UNIT, UROB, [place], UEPI, UWBC, Mark Frey, [place]  Basic Metabolic Panel: Recent Labs  Lab 03/17/18 0618 03/18/18 0653 03/19/18 0501 03/20/18 0533 03/21/18 0559  NA 136 136 138  --  138  K 4.4 4.9 4.8  --  4.5  CL 103 104 108  --  108  CO2 21* 22 23  --  25  GLUCOSE 148* 168* 187*  --  156*  BUN 33* 31* 36*  --  28*  CREATININE 1.66* 1.10 0.98  --  0.76  CALCIUM 8.1* 8.2* 8.1*  --  8.4*  MG  --  2.4 2.4 2.0  --    GFR Estimated Creatinine Clearance: 125.2 mL/min (by C-G formula based on SCr of 0.76 mg/dL). Liver Function Tests: Recent Labs  Lab 03/17/18 0618 03/18/18 0653 03/21/18 0559  AST 84* 37 17  ALT 51* 38 26  ALKPHOS 36* 39 36*  BILITOT 0.9 0.8 0.6  PROT 6.1* 6.0* 5.3*  ALBUMIN 2.7* 2.6* 2.5*   No results for input(s): LIPASE, AMYLASE in the last 168 hours. No results for input(s): AMMONIA in the last 168 hours. Coagulation profile Recent Labs  Lab 03/17/18 0618  INR 1.22    CBC: Recent Labs  Lab 03/17/18 0618  03/20/18 0533 03/20/18 2056 03/21/18 0559 03/22/18 0500 03/23/18 0500 03/23/18 1121  WBC 12.3*   < > 7.0  --  6.2 5.9 3.6* 3.5*  NEUTROABS 9.9*  --  6.2  --  5.3 5.5 3.0  --   HGB 8.4*   < > 7.0* 8.2* 7.9* 8.1* 7.6* 8.0*  HCT 25.7*   < > 22.1* 24.9* 24.1* 24.9* 23.4* 24.2*  MCV 86.5   < > 86.0  --  85.8 85.6 86.0 86.1  PLT 414*   < > 324  --  271 243 147* 137*   < > = values in this interval not displayed.   Cardiac Enzymes: No results for input(s): CKTOTAL, CKMB, CKMBINDEX, TROPONINI in the last 168 hours. BNP: Invalid input(s): POCBNP CBG: Recent Labs  Lab 03/22/18 1724 03/22/18 2127 03/23/18 0727 03/23/18 1206 03/23/18 1648  GLUCAP 290*  321* 149* 218* 159*   D-Dimer No results for input(s): DDIMER in the last 72 hours. Hgb A1c No results for input(s): HGBA1C in the last 72 hours. Lipid Profile No results for input(s): CHOL, HDL, LDLCALC, TRIG, CHOLHDL, LDLDIRECT in the last 72 hours. Thyroid function studies No results for input(s): TSH, T4TOTAL, T3FREE, THYROIDAB in the last 72 hours.  Invalid input(s): FREET3 Anemia work up No results for input(s): VITAMINB12, FOLATE, FERRITIN, TIBC, IRON, RETICCTPCT in the last 72 hours. Microbiology No results found for this or any previous visit (from the past 240 hour(s)). Pathology   Diagnosis 03/13/2018 Peritoneum, biopsy, RLQ cavity - HIGH GRADE B-CELL LYMPHOMA. - SEE COMMENT. Microscopic Comment Sections show needle core biopsy fragments of soft tissue displaying a dense and relatively monomorphic infiltrate of atypical medium to large lymphoid appearing cells displaying high nuclear cytoplasmic ratio, partially clumped to vesicular chromatin and single to multiple nucleoli associated with brisk mitosis, apoptosis and a starry sky pattern. The appearance is primarily diffuse with lack of atypical follicles or nodules. To further evaluate this process, flow cytometric analysis was performed ([id number]) and shows a monoclonal, kappa restricted B-cell population expressing pan B-cell antigens including CD20 associated with CD10 expression. No CD5 expression is identified. In addition, immunohistochemical stains were performed including BCL-2, BCL-6, CD10, CD20, CD3, CD30, CD34, LCA, CD5, CD79a, and TdT with appropriate controls. The lymphoid cells are diffusely positive for LCA and are predominantly composed of B cells as seen with CD20 and CD79a associated with CD10 and BCL-6 expression. There is also patchy, weak BCL-2 expression. No significant staining is seen with CD30, CD34 or TdT and there is no apparent co-expression of CD5 in B-cell areas. There is a minor T-cell  population in the background as primarily highlighted with CD3. The overall findings are most consistent with high grade B-cell lymphoma. Based on the overall findings, the differential diagnosis includes diffuse large cell lymphoma, Burkitt lymphoma as well as Burkitt-like lymphoma. To evaluate for the presence of double or triple hit lymphoma, FISH studies will be performed on paraffin embedded tissue and the results will be reported separately. (BNS:ecj 03/17/2018)   Colon, biopsy, right tumor 03/15/2018 - ATYPICAL LYMPHOID PROLIFERATION CONSISTENT WITH NON-HODGKIN LYMPHOMA, SEE  COMMENT.   Studies:  Ct Venogram Abd/pel  Result Date: 03/23/2018 CLINICAL DATA:  Right lower extremity DVT EXAM: CT VENOGRAM ABD-PELVIS TECHNIQUE: Multidetector CT imaging of the abdomen and pelvis was performed using the standard protocol during bolus administration of intravenous contrast. Multiplanar reconstructed images and MIPs were obtained and reviewed to evaluate the venous anatomy. CONTRAST:  130m ISOVUE-300 IOPAMIDOL (ISOVUE-300) INJECTION 61% COMPARISON:  03/10/2018 FINDINGS: VASCULAR Veins: Hepatic veins, portal vein, and splenic vein are patent. Superior mesenteric vein is patent. IVC is patent.  Renal veins are patent. Left common and external iliac veins are patent. The right common and external iliac veins are patent. There is compression of the right common and external iliac veins secondary to residual retroperitoneal hemorrhage and/or infiltrative mass. There is low-density filling defect in the right common femoral vein compatible with DVT. The right femoral vein is grossly patent. No evidence of left common femoral or femoral DVT. Review of the MIP images confirms the above findings. NON-VASCULAR Lower chest: Bibasilar atelectasis for scarring. Hepatobiliary: Liver is unremarkable.  Gallbladder is decompressed. Pancreas: Unremarkable Spleen: Unremarkable Adrenals/Urinary Tract: Nonspecific hypodensity in  the mid right kidney. Left kidney is within normal limits. Right hydronephrosis has nearly resolved. Small calculus in the posterior lower pole of the right kidney is unchanged. Stomach/Bowel: Severe wall thickening of a loop of bowel in the right lower quadrant either represents the terminal ileum or cecum. Wall thickening is very nodular and this is worrisome for adenocarcinoma or lymphoma. There is no evidence of small-bowel obstruction. The appendix is not readily visualized. There is no definite extraluminal bowel gas to suggest perforation. Stomach and duodenum are unremarkable. Lymphatic: There is no abnormal retroperitoneal adenopathy. Mild atherosclerotic calcification of the aorta without aneurysm. Reproductive: Prostate is unremarkable other than calcifications. Other: On the prior study, there was ill-defined and infiltrative soft tissue density throughout the right lower quadrant of the abdomen and extending into the right iliopsoas musculature and right inguinal region. This soft tissue density has largely resolved with some residual stranding throughout the retroperitoneum. The large infiltrative mass is now simply replaced by stranding and ill-defined bowel loops. Expansion of the right iliopsoas musculature has also improved. There is stranding throughout the musculature of the proximal right thigh which is stable. Due to the dramatic improvement of the infiltrative mass, this may represent a dramatic response to chemotherapy if this correlates with the given history. Alternatively, the ill-defined mass may represent hemorrhage which has evolved. Musculoskeletal: No vertebral compression deformity. IMPRESSION: VASCULAR There is no evidence of DVT in the abdomen or pelvis. Specifically, there is no evidence of right common or external iliac vein DVT. The study is positive for DVT in the right common femoral vein. NON-VASCULAR The large infiltrative mass in the right lower quadrant has largely  improved. The dramatic improvement either represents a rapid response to chemotherapy or possibly evolving hemorrhage. Correlate clinically with vital signs and hemoglobin levels. The right hydronephrosis has also nearly resolved. There is persistent compression of the right common and external iliac veins due to residual retroperitoneal hemorrhage. Circumferential mass in the terminal ileum or cecum worrisome for adenocarcinoma or lymphoma. It is conceivable that the wall thickening may be related to hemorrhage in the wall of bowel, but it is rather nodular suggesting neoplasm. Electronically Signed   By: AMarybelle KillingsM.D.   On: 03/23/2018 08:17    Assessment: 62y.o. male with past medical history of diabetes and hyperlipidemia, presented with acute right lower extremity DVT, and  further work-up found submassive PE and a large abdominal mass.  1. 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 Right ascending colon mass, likely due to abdominal lymphoma direct invasion 3.  Submassive PE and right lower extremity DVT, on heparin drip 4.  Microcytic anemia, likely secondary to GI bleeding and iron deficiency, rule out bone marrow involvement from lymphoma 5. Type 2 diabetes  6. Obesity  7. Anemia, secondary to GI bleeding and iron deficient anemia, he received blood transfusion 2u on 7/4, 1u on 7/5 and 1u on 7/8, iv feraheme on 7/4  8.  Mild leukopenia and thrombocytopenia, secondary to chemo 9.  Hyperuricemia secondary to chemo, resolved now  10. Deconditioning    Plan:  -he has completed the first cycle chemotherapy R-CHOP (full dose), tolerated well so far. -His CSF cytology was negative for malignant cells, I discussed with patient and his wife. -I discussed the CT venogram abdomen and pelvis which showed significant shrinkage of the large infiltrative mass in the abdomen and the pelvis, right hydronephrosis has resolved, no significant thrombus in the  IVC, he does have thrombus in the right femoral vein -He has not had clinical bleeding for the past 4 days, heparin SQ restarted yesterday, tolerated well. -I discussed with the option of restart full dose anticoagulation with heparin drip, versus IVC filter placement now.  Given the significant response to the chemotherapy, I think the risk of bleeding from the colon mass has decreased, and should be okay to rechallenge him with heparin drip.  I discussed with patient and his wife, he is in agreement to restart heparin drip.  However if he does have significant recurrent GI bleeding, then will stop anticoagulation and place a IVC filter. I spoke with IR Dr. Laurence Ferrari today   -if he has no bleeding on heparin drip for 1-2 days, he can be discharged with lovenox, dose will be determined upon discharge. I will see him in my clinic closely.  -he is due for second dose feraheme, I will order it for today  -will f/u closely. I discussed with Dr. Wynelle Cleveland today.  I spent a total of 30 mins in coordinate his care, >50% on face to face counseling.  Truitt Merle, MD 03/23/2018  5:18 PM

## 2018-03-23 NOTE — Progress Notes (Signed)
ANTICOAGULATION CONSULT NOTE - Initial Consult  Pharmacy Consult for IV Heparin Indication: pulmonary embolus  Allergies  Allergen Reactions  . Ciprofloxacin Other (See Comments)    Leg tingling    Patient Measurements: Height: _0  (175.3 cm) Weight: 269 lb 8 oz (122.2 kg) IBW/kg (Calculated) : 70.7 Heparin Dosing Weight:   Vital Signs: Temp: 98.3 F (36.8 C) (07/11 0540) Temp Source: Oral (07/11 0540) BP: 128/72 (07/11 0540) Pulse Rate: 97 (07/11 0540)  Labs: Recent Labs    03/21/18 0559 03/22/18 0500 03/23/18 0500 03/23/18 1121  HGB 7.9* 8.1* 7.6* 8.0*  HCT 24.1* 24.9* 23.4* 24.2*  PLT 271 243 147* 137*  CREATININE 0.76  --   --   --     Estimated Creatinine Clearance: 125.2 mL/min (by C-G formula based on SCr of 0.76 mg/dL).   Medical History: Past Medical History:  Diagnosis Date  . ALLERGIC RHINITIS   . Diabetes mellitus   . Hyperlipidemia     Medications:  Scheduled:  . allopurinol  300 mg Oral Daily  . docusate sodium  100 mg Oral BID  . fenofibrate  160 mg Oral Daily  . insulin aspart  0-9 Units Subcutaneous TID WC  . methotrexate INTRATHECAL (+/- HYDROCORTISONE,Ara-C)   Intrathecal Once     Assessment: 18 yoM went to his PCP office on 6/27 with c/o right leg edema and was sent home on xarelto 15 mg bid pending LE doppler.  Outpatient LE doppler showed acute right DVT involving the right saphenofemoral junction, right common femoral vein, posterior tibial and peroneal veins. He was sent to the ED on 6/28 for further workup and management of thromboembolism. Chest CTA on 6/28 showed acute bilateral PE with evidence of right heart strain and abd CT showed abdominal mass. Oncology consulted for intra-abdominal tumor. Heparin started on admission for acute DVT/PE.  Note hemoccult positive on admission.  Significant events: 7/1 - heparin off at 0800 (6 hrs) prior to IR biopsy. Resumed 4 hrs post procedure at 2100 7/3 - heparin held at 0700 prior to  colonoscopy. 7/3 - Colonoscopy revealed large, friable, ulcerated mass of the right colon.  GI recommended be careful with anticoagulation, recommended heparin for now pending decision re: plan of care.  Dr. Ardis Hughs ok with resuming heparin at this time but will shoot for lower end therapeutic goal 7/4 - heparin drip stopped ~7P due to falling Hgb and bloody BM; transfused 2 units PRBC 7/5 - Underwent CT guided bone marrow biopsy and port-a-cath insertion. Hgb improved, heparin to be resumed with target low end of therapeutic range per Dr. Wyline Copas.  Heparin level was at goal yesterday on 2600 units/hr. 7/11 - Restarting heparin. Will aim for lower target range of 0.3 to 0.5 due to previous bleed during this admission     Goal of Therapy:  Heparin level 0.3- 0.5 due to previous bleeding during admission Monitor platelets by anticoagulation protocol: Yes   Plan:   Will restart at last previous therapeutic rate of 2600 units/hr   Heparin level 6 hours after med started  Daily CBC    Royetta Asal, PharmD, BCPS Pager [phone] 03/23/2018 2:42 PM

## 2018-03-24 LAB — CBC WITH DIFFERENTIAL/PLATELET
BASOS PCT: 0 %
Basophils Absolute: 0 10*3/uL (ref 0.0–0.1)
EOS ABS: 0.2 10*3/uL (ref 0.0–0.7)
EOS PCT: 6 %
HCT: 23.3 % — ABNORMAL LOW (ref 39.0–52.0)
HEMOGLOBIN: 7.7 g/dL — AB (ref 13.0–17.0)
Lymphocytes Relative: 24 %
Lymphs Abs: 0.7 10*3/uL (ref 0.7–4.0)
MCH: 28.5 pg (ref 26.0–34.0)
MCHC: 33 g/dL (ref 30.0–36.0)
MCV: 86.3 fL (ref 78.0–100.0)
Monocytes Absolute: 0 10*3/uL (ref 0.1–1.0)
Monocytes Relative: 0 %
NEUTROS PCT: 70 %
Neutro Abs: 2.2 10*3/uL (ref 1.7–7.7)
Platelets: 126 10*3/uL — ABNORMAL LOW (ref 150–400)
RBC: 2.7 MIL/uL — AB (ref 4.22–5.81)
RDW: 17.4 % — ABNORMAL HIGH (ref 11.5–15.5)
WBC: 3.1 10*3/uL — ABNORMAL LOW (ref 4.0–10.5)

## 2018-03-24 LAB — HEMOGLOBIN: Hemoglobin: 8.7 g/dL — ABNORMAL LOW (ref 13.0–17.0)

## 2018-03-24 LAB — HEPARIN LEVEL (UNFRACTIONATED)
HEPARIN UNFRACTIONATED: 0.8 [IU]/mL — AB (ref 0.30–0.70)
HEPARIN UNFRACTIONATED: 0.3 [IU]/mL (ref 0.30–0.70)

## 2018-03-24 LAB — PREPARE RBC (CROSSMATCH)

## 2018-03-24 LAB — HEMATOCRIT: HEMATOCRIT: 26.2 % — AB (ref 39.0–52.0)

## 2018-03-24 LAB — GLUCOSE, CAPILLARY
Glucose-Capillary: 126 mg/dL — ABNORMAL HIGH (ref 70–99)
Glucose-Capillary: 146 mg/dL — ABNORMAL HIGH (ref 70–99)
Glucose-Capillary: 170 mg/dL — ABNORMAL HIGH (ref 70–99)

## 2018-03-24 MED ORDER — HEPARIN (PORCINE) IN NACL 100-0.45 UNIT/ML-% IJ SOLN
1800.0000 [IU]/h | INTRAMUSCULAR | Status: DC
  Administered 2018-03-24 – 2018-03-25 (×2): 1800 [IU]/h via INTRAVENOUS
  Filled 2018-03-24 (×5): qty 250

## 2018-03-24 MED ORDER — SODIUM CHLORIDE 0.9% IV SOLUTION
Freq: Once | INTRAVENOUS | Status: AC
  Administered 2018-03-24: 14:00:00 via INTRAVENOUS

## 2018-03-24 NOTE — Progress Notes (Signed)
Mark Frey   DOB:25-Jul-1956   SE#:[id number]   WVP#:[id number]  Oncology f/u note   Subjective: Pt has been tolerating IV heparin drip well, had one normal bowel and last night, no overt GI bleeding.  He received IV Feraheme and 1 unit of blood today.  Has good appetite, eating lunch when I saw him.  No other complaints.   Objective:  Vitals:   03/24/18 1303 03/24/18 1333  BP: 128/65 122/70  Pulse: 98 99  Resp: 16 16  Temp: 98.3 F (36.8 C) 98.7 F (37.1 C)  SpO2:  99%    Body mass index is 39.8 kg/m.  Intake/Output Summary (Last 24 hours) at 03/24/2018 1641 Last data filed at 03/24/2018 1450 Gross per 24 hour  Intake 1560.43 ml  Output 2725 ml  Net -1164.57 ml     Sclerae unicteric  Oropharynx clear  No peripheral adenopathy  Lungs clear -- no rales or rhonchi  Heart regular rate and rhythm  Abdomen soft, mild tenderness at right side abdomen, there is a large palpable abdominal mass in the most of the right side abdomen, which appears to be smaller now.   MSK no focal spinal tenderness, (+) pitting edema on b/l LEs, R>L   Neuro nonfocal    CBG (last 3)  Recent Labs    03/23/18 1648 03/23/18 2226 03/24/18 1207  GLUCAP 159* 217* 126*     Labs:  Lab Results  Component Value Date   WBC 3.1 (L) 03/24/2018   HGB 7.7 (L) 03/24/2018   HCT 23.3 (L) 03/24/2018   MCV 86.3 03/24/2018   PLT 126 (L) 03/24/2018   NEUTROABS 2.2 03/24/2018    CMP Latest Ref Rng & Units 03/21/2018 03/19/2018 03/18/2018  Glucose 70 - 99 mg/dL 156(H) 187(H) 168(H)  BUN 8 - 23 mg/dL 28(H) 36(H) 31(H)  Creatinine 0.61 - 1.24 mg/dL 0.76 0.98 1.10  Sodium 135 - 145 mmol/L 138 138 136  Potassium 3.5 - 5.1 mmol/L 4.5 4.8 4.9  Chloride 98 - 111 mmol/L 108 108 104  CO2 22 - 32 mmol/L 25 23 22   Calcium 8.9 - 10.3 mg/dL 8.4(L) 8.1(L) 8.2(L)  Total Protein 6.5 - 8.1 g/dL 5.3(L) - 6.0(L)  Total Bilirubin 0.3 - 1.2 mg/dL 0.6 - 0.8  Alkaline Phos 38 - 126 U/L 36(L) - 39  AST 15 - 41 U/L 17 - 37  ALT  0 - 44 U/L 26 - 38     Urine Studies No results for input(s): UHGB, CRYS in the last 72 hours.  Invalid input(s): UACOL, UAPR, USPG, UPH, UTP, UGL, UKET, UBIL, UNIT, UROB, ULEU, UEPI, UWBC, URBC, UBAC, CAST, [place], [place]  Basic Metabolic Panel: Recent Labs  Lab 03/18/18 0653 03/19/18 0501 03/20/18 0533 03/21/18 0559  NA 136 138  --  138  K 4.9 4.8  --  4.5  CL 104 108  --  108  CO2 22 23  --  25  GLUCOSE 168* 187*  --  156*  BUN 31* 36*  --  28*  CREATININE 1.10 0.98  --  0.76  CALCIUM 8.2* 8.1*  --  8.4*  MG 2.4 2.4 2.0  --    GFR Estimated Creatinine Clearance: 125.2 mL/min (by C-G formula based on SCr of 0.76 mg/dL). Liver Function Tests: Recent Labs  Lab 03/18/18 0653 03/21/18 0559  AST 37 17  ALT 38 26  ALKPHOS 39 36*  BILITOT 0.8 0.6  PROT 6.0* 5.3*  ALBUMIN 2.6* 2.5*   No results for  input(s): LIPASE, AMYLASE in the last 168 hours. No results for input(s): AMMONIA in the last 168 hours. Coagulation profile No results for input(s): INR, PROTIME in the last 168 hours.  CBC: Recent Labs  Lab 03/20/18 0533  03/21/18 0559 03/22/18 0500 03/23/18 0500 03/23/18 1121 03/24/18 0340  WBC 7.0  --  6.2 5.9 3.6* 3.5* 3.1*  NEUTROABS 6.2  --  5.3 5.5 3.0  --  2.2  HGB 7.0*   < > 7.9* 8.1* 7.6* 8.0* 7.7*  HCT 22.1*   < > 24.1* 24.9* 23.4* 24.2* 23.3*  MCV 86.0  --  85.8 85.6 86.0 86.1 86.3  PLT 324  --  271 243 147* 137* 126*   < > = values in this interval not displayed.   Cardiac Enzymes: No results for input(s): CKTOTAL, CKMB, CKMBINDEX, TROPONINI in the last 168 hours. BNP: Invalid input(s): POCBNP CBG: Recent Labs  Lab 03/23/18 0727 03/23/18 1206 03/23/18 1648 03/23/18 2226 03/24/18 1207  GLUCAP 149* 218* 159* 217* 126*   D-Dimer No results for input(s): DDIMER in the last 72 hours. Hgb A1c No results for input(s): HGBA1C in the last 72 hours. Lipid Profile No results for input(s): CHOL, HDL, LDLCALC, TRIG, CHOLHDL, LDLDIRECT in the last 72  hours. Thyroid function studies No results for input(s): TSH, T4TOTAL, T3FREE, THYROIDAB in the last 72 hours.  Invalid input(s): FREET3 Anemia work up No results for input(s): VITAMINB12, FOLATE, FERRITIN, TIBC, IRON, RETICCTPCT in the last 72 hours. Microbiology No results found for this or any previous visit (from the past 240 hour(s)). Pathology   Diagnosis 03/13/2018 Peritoneum, biopsy, RLQ cavity - HIGH GRADE B-CELL LYMPHOMA. - SEE COMMENT. Microscopic Comment Sections show needle core biopsy fragments of soft tissue displaying a dense and relatively monomorphic infiltrate of atypical medium to large lymphoid appearing cells displaying high nuclear cytoplasmic ratio, partially clumped to vesicular chromatin and single to multiple nucleoli associated with brisk mitosis, apoptosis and a starry sky pattern. The appearance is primarily diffuse with lack of atypical follicles or nodules. To further evaluate this process, flow cytometric analysis was performed ([id number]) and shows a monoclonal, kappa restricted B-cell population expressing pan B-cell antigens including CD20 associated with CD10 expression. No CD5 expression is identified. In addition, immunohistochemical stains were performed including BCL-2, BCL-6, CD10, CD20, CD3, CD30, CD34, LCA, CD5, CD79a, and TdT with appropriate controls. The lymphoid cells are diffusely positive for LCA and are predominantly composed of B cells as seen with CD20 and CD79a associated with CD10 and BCL-6 expression. There is also patchy, weak BCL-2 expression. No significant staining is seen with CD30, CD34 or TdT and there is no apparent co-expression of CD5 in B-cell areas. There is a minor T-cell population in the background as primarily highlighted with CD3. The overall findings are most consistent with high grade B-cell lymphoma. Based on the overall findings, the differential diagnosis includes diffuse large cell lymphoma, Burkitt lymphoma as  well as Burkitt-like lymphoma. To evaluate for the presence of double or triple hit lymphoma, FISH studies will be performed on paraffin embedded tissue and the results will be reported separately. (BNS:ecj 03/17/2018)   Colon, biopsy, right tumor 03/15/2018 - ATYPICAL LYMPHOID PROLIFERATION CONSISTENT WITH NON-HODGKIN LYMPHOMA, SEE COMMENT.   Studies:  Ct Venogram Abd/pel  Result Date: 03/23/2018 CLINICAL DATA:  Right lower extremity DVT EXAM: CT VENOGRAM ABD-PELVIS TECHNIQUE: Multidetector CT imaging of the abdomen and pelvis was performed using the standard protocol during bolus administration of intravenous contrast. Multiplanar reconstructed images and  MIPs were obtained and reviewed to evaluate the venous anatomy. CONTRAST:  142m ISOVUE-300 IOPAMIDOL (ISOVUE-300) INJECTION 61% COMPARISON:  03/10/2018 FINDINGS: VASCULAR Veins: Hepatic veins, portal vein, and splenic vein are patent. Superior mesenteric vein is patent. IVC is patent.  Renal veins are patent. Left common and external iliac veins are patent. The right common and external iliac veins are patent. There is compression of the right common and external iliac veins secondary to residual retroperitoneal hemorrhage and/or infiltrative mass. There is low-density filling defect in the right common femoral vein compatible with DVT. The right femoral vein is grossly patent. No evidence of left common femoral or femoral DVT. Review of the MIP images confirms the above findings. NON-VASCULAR Lower chest: Bibasilar atelectasis for scarring. Hepatobiliary: Liver is unremarkable.  Gallbladder is decompressed. Pancreas: Unremarkable Spleen: Unremarkable Adrenals/Urinary Tract: Nonspecific hypodensity in the mid right kidney. Left kidney is within normal limits. Right hydronephrosis has nearly resolved. Small calculus in the posterior lower pole of the right kidney is unchanged. Stomach/Bowel: Severe wall thickening of a loop of bowel in the right lower  quadrant either represents the terminal ileum or cecum. Wall thickening is very nodular and this is worrisome for adenocarcinoma or lymphoma. There is no evidence of small-bowel obstruction. The appendix is not readily visualized. There is no definite extraluminal bowel gas to suggest perforation. Stomach and duodenum are unremarkable. Lymphatic: There is no abnormal retroperitoneal adenopathy. Mild atherosclerotic calcification of the aorta without aneurysm. Reproductive: Prostate is unremarkable other than calcifications. Other: On the prior study, there was ill-defined and infiltrative soft tissue density throughout the right lower quadrant of the abdomen and extending into the right iliopsoas musculature and right inguinal region. This soft tissue density has largely resolved with some residual stranding throughout the retroperitoneum. The large infiltrative mass is now simply replaced by stranding and ill-defined bowel loops. Expansion of the right iliopsoas musculature has also improved. There is stranding throughout the musculature of the proximal right thigh which is stable. Due to the dramatic improvement of the infiltrative mass, this may represent a dramatic response to chemotherapy if this correlates with the given history. Alternatively, the ill-defined mass may represent hemorrhage which has evolved. Musculoskeletal: No vertebral compression deformity. IMPRESSION: VASCULAR There is no evidence of DVT in the abdomen or pelvis. Specifically, there is no evidence of right common or external iliac vein DVT. The study is positive for DVT in the right common femoral vein. NON-VASCULAR The large infiltrative mass in the right lower quadrant has largely improved. The dramatic improvement either represents a rapid response to chemotherapy or possibly evolving hemorrhage. Correlate clinically with vital signs and hemoglobin levels. The right hydronephrosis has also nearly resolved. There is persistent  compression of the right common and external iliac veins due to residual retroperitoneal hemorrhage. Circumferential mass in the terminal ileum or cecum worrisome for adenocarcinoma or lymphoma. It is conceivable that the wall thickening may be related to hemorrhage in the wall of bowel, but it is rather nodular suggesting neoplasm. Electronically Signed   By: AMarybelle KillingsM.D.   On: 03/23/2018 08:17    Assessment: 62y.o. male with past medical history of diabetes and hyperlipidemia, presented with acute right lower extremity DVT, and further work-up found submassive PE and a large abdominal mass.  1. 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 Right ascending colon mass, likely due to abdominal lymphoma direct invasion 3.  Submassive PE and right lower extremity DVT, on heparin  drip 4.  Microcytic anemia, likely secondary to GI bleeding and iron deficiency, rule out bone marrow involvement from lymphoma 5. Type 2 diabetes  6. Obesity  7. Anemia, secondary to GI bleeding and iron deficient anemia, he received blood transfusion 2u on 7/4, 1u on 7/5 and 1u on 7/8, iv feraheme on 7/4  8.  Mild leukopenia and thrombocytopenia, secondary to chemo 9.  Hyperuricemia secondary to chemo, resolved now  10. Deconditioning    Plan:  -He is doing well, has been on iv heparin for >20 hrs without clinical bleeding. Hb slightly dropped, possible related to chemo. -if no bleeding by tomorrow, OK to d/c home. I would recommend lovenox 156m daily (80% of full dose based on his weight, but also he is morbid obese, we some time dose reduce for pt with morbid obesity). Please give pt the first dose in hospital and teach his wife how to do it.  -I will see him back on Monday 7/15 with lab and Neulasta injection    I spent a total of 25 mins in coordinate his care, >50% on face to face counseling.  YTruitt Merle MD 03/24/2018  4:41 PM

## 2018-03-24 NOTE — Progress Notes (Signed)
Patient had a large BM, soft and black in color, no frank blood except smear on the toilet tissue

## 2018-03-24 NOTE — Progress Notes (Signed)
ANTICOAGULATION CONSULT NOTE - Consult  Pharmacy Consult for IV Heparin Indication: pulmonary embolus  Allergies  Allergen Reactions  . Ciprofloxacin Other (See Comments)    Leg tingling    Patient Measurements: Height: 5' 9"  (175.3 cm) Weight: 269 lb 8 oz (122.2 kg) IBW/kg (Calculated) : 70.7 Heparin Dosing Weight:   Vital Signs: Temp: 98.4 F (36.9 C) (07/12 2055) Temp Source: Oral (07/12 2055) BP: 158/81 (07/12 2055) Pulse Rate: 95 (07/12 2055)  Labs: Recent Labs    03/23/18 0500 03/23/18 1121 03/23/18 2212 03/24/18 0340 03/24/18 0612 03/24/18 2035  HGB 7.6* 8.0*  --  7.7*  --  8.7*  HCT 23.4* 24.2*  --  23.3*  --  26.2*  PLT 147* 137*  --  126*  --   --   HEPARINUNFRC  --   --  0.73*  --  0.80* 0.30    Estimated Creatinine Clearance: 125.2 mL/min (by C-G formula based on SCr of 0.76 mg/dL).   Medical History: Past Medical History:  Diagnosis Date  . ALLERGIC RHINITIS   . Diabetes mellitus   . Hyperlipidemia     Medications:  Scheduled:  . allopurinol  300 mg Oral Daily  . docusate sodium  100 mg Oral BID  . fenofibrate  160 mg Oral Daily  . insulin aspart  0-9 Units Subcutaneous TID WC     Assessment: 83 yoM went to his PCP office on 6/27 with c/o right leg edema and was sent home on xarelto 15 mg bid pending LE doppler.  Outpatient LE doppler showed acute right DVT involving the right saphenofemoral junction, right common femoral vein, posterior tibial and peroneal veins. He was sent to the ED on 6/28 for further workup and management of thromboembolism. Chest CTA on 6/28 showed acute bilateral PE with evidence of right heart strain and abd CT showed abdominal mass. Oncology consulted for intra-abdominal tumor. Heparin started on admission for acute DVT/PE.  Note hemoccult positive on admission.  Significant events: 7/1 - heparin off at 0800 (6 hrs) prior to IR biopsy. Resumed 4 hrs post procedure at 2100 7/3 - heparin held at 0700 prior to  colonoscopy. 7/3 - Colonoscopy revealed large, friable, ulcerated mass of the right colon.  GI recommended be careful with anticoagulation, recommended heparin for now pending decision re: plan of care.  Dr. Ardis Hughs ok with resuming heparin at this time but will shoot for lower end therapeutic goal 7/4 - heparin drip stopped ~7P due to falling Hgb and bloody BM; transfused 2 units PRBC 7/5 - Underwent CT guided bone marrow biopsy and port-a-cath insertion. Hgb improved, heparin to be resumed with target low end of therapeutic range per Dr. Wyline Copas.  Heparin level was at goal yesterday on 2600 units/hr. 7/11 - Restarting heparin. Will aim for lower target range of 0.3 to 0.5 due to previous bleed during this admission  Today, 03/24/2018  Heparin level due at 13:30 not drawn until 20:35 due to several issues (labeled as RN draw but needs to be peripheral draw as heparin infusing via PAC then receiving PRBC and unable to draw labs)  Heparin level this evening = 0.3 (low end goal)  Hgb after PRBC improved 7.7 to 8.7  RN reported large black BM this evening but no frank blood  Goal of Therapy:  Heparin level 0.3- 0.5 due to previous bleeding during admission Monitor platelets by anticoagulation protocol: Yes   Plan:   Continue heparin drip at 1800 units/hr  Check daily heparin level  Daily CBC - follow Hgb closely  Follow for GIB, stools  Doreene Eland, PharmD, BCPS.   Pager: [phone] 03/24/2018 9:28 PM

## 2018-03-24 NOTE — Progress Notes (Signed)
PROGRESS NOTE    Mark Frey   IDP:[id number]  DOB: [birth year]  DOA: 03/10/2018 PCP: [name], MD   Brief Narrative:  Mark Frey 62 y.o.male with a history of diabetes mellitus 2 that presented secondary to DVT in the right leg found on ultrasound.  > Right lower extremity is positive for acute deep vein thrombosis involving the right saphenofemoral junction, right common femoral vein, posterior tibial and peroneal veins. The right external and common iliac veins are patent with no obvious evidence of acute thrombosis. There is no evidence of right Baker's cyst   CTA of chest revealed: Acute bilateral pulmonary embolism involving lobar, segmental and subsegmental branches.  CT abd/pelvis revealed:  1. Large irregular infiltrative solid mass in the right lower quadrant measuring up to 18.7 x 18.5 x 17.8 cm, infiltrating and encasing multiple distal small bowel loops and likely the ileocecal region, partially encasing the sigmoid colon, with prominent extension into the right lower retroperitoneum and extraperitoneal right pelvis with encasement of right external iliac and proximal right common iliac vasculature and infiltration of the right iliopsoas muscle. 2. Mild right hydronephrosis due to thick soft tissue encasing the right ureter most prominently at the right UPJ and right UVJ. 3. Nonobstructing right nephrolithiasis. 4. Moderate sigmoid diverticulosis. 5.  Aortic Atherosclerosis (ICD10-I70.0).   Subjective: No complaints today.    Assessment & Plan:   Principal Problem:   Bilateral pulmonary embolism     Acute deep vein thrombosis (DVT) of femoral vein of right lower extremity   - due to GI bleed, Heparin infusion stopped and patient placed on s/c Heparin - discussed long term treatment plan with Dr Burr Medico- she requested a venogram shows: "the infiltrative mass in the right lower quadrant has largely improved- right hydronephrosis has also nearly  resolved- persistent compression of the right common and external iliac veins due to residual retroperitoneal hemorrhage- Circumferential mass in the terminal ileum or cecum worrisome for adenocarcinoma or lymphoma." - Dr Burr Medico recommends resuming Heparin infusion and monitoring for further GI bleeding - he has not had any bleeding yet- cont to follow for 24 more hours prior to transitioning to NOAC - cont TEDs     High grade B-cell lymphoma with bleeding- see above - s/p EGD and Colonoscopy on 7/3-  - found to have right abdominal mass infiltrating the proximal colon, mucosa was friable - biopsied>> 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    Controlled diabetes mellitus type II without complication - on Janumet, Amaryl at home - TID Novolog in hospital  Scrotal edema - ultrasound scrotum 1. Negative for testicular torsion or intratesticular mass 2. Bilateral hydroceles, small on the left and moderate on the right 3. Small right varicocele 4. Extensive scrotal edema and skin thickening  AKI - Cr rose to 1.66 and has improved to 0.76 with replacement of blood and IV hydration  DVT prophylaxis: Heparin  Code Status: Full code Family Communication:  Disposition Plan:    Consultants:   Oncology  GI  General surgery Procedures:   EGD/ Colonoscopy and biopsy 03/15/18 Antimicrobials:  Anti-infectives (From admission, onward)   Start      Dose/Rate Route Frequency Ordered Stop   03/17/18 1000  ceFAZolin (ANCEF) IVPB 2g/100 mL premix  Status:  Discontinued     2 g 200 mL/hr over 30 Minutes Intravenous To Radiology 03/16/18 1132 03/17/18 1010       Objective: Vitals:   03/23/18 0540 03/23/18 1550 03/23/18 2228 03/24/18 0500  BP: 128/72 (!) 157/95 133/74 127/70  Pulse: 97 98 98 88  Resp: 18 17 20 16   Temp: 98.3 F (36.8 C) 98.2 F (36.8 C) 98.9 F (37.2 C) 97.8 F (36.6 C)  TempSrc: Oral Oral Oral Oral  SpO2: 98% 98% 96% 98%  Weight:      Height:        Intake/Output Summary (Last 24 hours) at 03/24/2018 1214 Last data filed at 03/24/2018 1008 Gross per 24 hour  Intake 1690.43 ml  Output 3225 ml  Net -1534.57 ml   Filed Weights   03/15/18 1155 03/18/18 0230 03/18/18 0845  Weight: 117.9 kg (260 lb) 90.9 kg (200 lb 6.4 oz) 122.2 kg (269 lb 8 oz)    Examination: General exam: Appears comfortable  HEENT: PERRLA, oral mucosa moist, no sclera icterus or thrush Respiratory system: Clear to auscultation. Respiratory effort normal. Cardiovascular system: S1 & S2 heard, RRR.   Gastrointestinal system: Abdomen soft, non-tender, nondistended. Right lower abdominal mass felt. Normal bowel sound.  Central nervous system: Alert and oriented. No focal neurological deficits. Extremities: No cyanosis, clubbing - mild swelling of RLE   Skin: No rashes or ulcers Psychiatry:  Mood & affect appropriate.     Data Reviewed: I have personally reviewed following labs and imaging studies  CBC: Recent Labs  Lab 03/20/18 0533  03/21/18 0559 03/22/18 0500 03/23/18 0500 03/23/18 1121 03/24/18 0340  WBC 7.0  --  6.2 5.9 3.6* 3.5* 3.1*  NEUTROABS 6.2  --  5.3 5.5 3.0  --  2.2  HGB 7.0*   < > 7.9* 8.1* 7.6* 8.0* 7.7*  HCT 22.1*   < > 24.1* 24.9* 23.4* 24.2* 23.3*  MCV 86.0  --  85.8 85.6 86.0 86.1 86.3  PLT 324  --  271 243 147* 137* 126*   < > = values in this interval not displayed.   Basic Metabolic Panel: Recent  Labs  Lab 03/18/18 0653 03/19/18 0501 03/20/18 0533 03/21/18 0559  NA 136 138  --  138  K 4.9 4.8  --  4.5  CL 104 108  --  108  CO2 22 23  --  25  GLUCOSE 168* 187*  --  156*  BUN 31* 36*  --  28*  CREATININE 1.10 0.98  --  0.76  CALCIUM 8.2* 8.1*  --  8.4*  MG 2.4 2.4 2.0  --    GFR: Estimated Creatinine Clearance: 125.2 mL/min (by C-G formula based on SCr of 0.76 mg/dL). Liver Function Tests: Recent Labs  Lab 03/18/18 0653 03/21/18 0559  AST 37 17  ALT 38 26  ALKPHOS 39 36*  BILITOT 0.8 0.6  PROT 6.0* 5.3*  ALBUMIN 2.6* 2.5*   No results for input(s): LIPASE, AMYLASE in the last 168 hours. No results for input(s): AMMONIA in the last 168 hours. Coagulation Profile: No results for input(s): INR, PROTIME in the last 168 hours. Cardiac  Enzymes: No results for input(s): CKTOTAL, CKMB, CKMBINDEX, TROPONINI in the last 168 hours. BNP (last 3 results) No results for input(s): PROBNP in the last 8760 hours. HbA1C: No results for input(s): HGBA1C in the last 72 hours. CBG: Recent Labs  Lab 03/23/18 0727 03/23/18 1206 03/23/18 1648 03/23/18 2226 03/24/18 1207  GLUCAP 149* 218* 159* 217* 126*   Lipid Profile: No results for input(s): CHOL, HDL, LDLCALC, TRIG, CHOLHDL, LDLDIRECT in the last 72 hours. Thyroid Function Tests: No results for input(s): TSH, T4TOTAL, FREET4, T3FREE, THYROIDAB in the last 72 hours. Anemia Panel: No results for input(s): VITAMINB12, FOLATE, FERRITIN, TIBC, IRON, RETICCTPCT in the last 72 hours. Urine analysis:    Component Value Date/Time   COLORURINE yellow 01/20/2009 0840   APPEARANCEUR Clear 01/20/2009 0840   LABSPEC 1.010 01/20/2009 0840   PHURINE 5.5 01/20/2009 0840   HGBUR negative 01/20/2009 0840   BILIRUBINUR Small 01/30/2018 1652   PROTEINUR Positive (A) 01/30/2018 1652   UROBILINOGEN 2.0 (A) 01/30/2018 1652   UROBILINOGEN 0.2 01/20/2009 0840   NITRITE Negative 01/30/2018 1652   NITRITE negative 01/20/2009 0840    LEUKOCYTESUR Negative 01/30/2018 1652   Sepsis Labs: @LABRCNTIP (procalcitonin:4,lacticidven:4) )No results found for this or any previous visit (from the past 240 hour(s)).       Radiology Studies: Ct Venogram Abd/pel  Result Date: 03/23/2018 CLINICAL DATA:  Right lower extremity DVT EXAM: CT VENOGRAM ABD-PELVIS TECHNIQUE: Multidetector CT imaging of the abdomen and pelvis was performed using the standard protocol during bolus administration of intravenous contrast. Multiplanar reconstructed images and MIPs were obtained and reviewed to evaluate the venous anatomy. CONTRAST:  181m ISOVUE-300 IOPAMIDOL (ISOVUE-300) INJECTION 61% COMPARISON:  03/10/2018 FINDINGS: VASCULAR Veins: Hepatic veins, portal vein, and splenic vein are patent. Superior mesenteric vein is patent. IVC is patent.  Renal veins are patent. Left common and external iliac veins are patent. The right common and external iliac veins are patent. There is compression of the right common and external iliac veins secondary to residual retroperitoneal hemorrhage and/or infiltrative mass. There is low-density filling defect in the right common femoral vein compatible with DVT. The right femoral vein is grossly patent. No evidence of left common femoral or femoral DVT. Review of the MIP images confirms the above findings. NON-VASCULAR Lower chest: Bibasilar atelectasis for scarring. Hepatobiliary: Liver is unremarkable.  Gallbladder is decompressed. Pancreas: Unremarkable Spleen: Unremarkable Adrenals/Urinary Tract: Nonspecific hypodensity in the mid right kidney. Left kidney is within normal limits. Right hydronephrosis has nearly resolved. Small calculus in the posterior lower pole of the right kidney is unchanged. Stomach/Bowel: Severe wall thickening of a loop of bowel in the right lower quadrant either represents the terminal ileum or cecum. Wall thickening is very nodular and this is worrisome for adenocarcinoma or lymphoma. There is no  evidence of small-bowel obstruction. The appendix is not readily visualized. There is no definite extraluminal bowel gas to suggest perforation. Stomach and duodenum are unremarkable. Lymphatic: There is no abnormal retroperitoneal adenopathy. Mild atherosclerotic calcification of the aorta without aneurysm. Reproductive: Prostate is unremarkable other than calcifications. Other: On the prior study, there was ill-defined and infiltrative soft tissue density throughout the right lower quadrant of the abdomen and extending into the right iliopsoas musculature and right inguinal region. This soft tissue density has largely resolved with some residual stranding throughout the retroperitoneum. The large infiltrative mass is now simply replaced by stranding and ill-defined bowel loops. Expansion of the right iliopsoas musculature has also improved. There is stranding throughout the musculature  of the proximal right thigh which is stable. Due to the dramatic improvement of the infiltrative mass, this may represent a dramatic response to chemotherapy if this correlates with the given history. Alternatively, the ill-defined mass may represent hemorrhage which has evolved. Musculoskeletal: No vertebral compression deformity. IMPRESSION: VASCULAR There is no evidence of DVT in the abdomen or pelvis. Specifically, there is no evidence of right common or external iliac vein DVT. The study is positive for DVT in the right common femoral vein. NON-VASCULAR The large infiltrative mass in the right lower quadrant has largely improved. The dramatic improvement either represents a rapid response to chemotherapy or possibly evolving hemorrhage. Correlate clinically with vital signs and hemoglobin levels. The right hydronephrosis has also nearly resolved. There is persistent compression of the right common and external iliac veins due to residual retroperitoneal hemorrhage. Circumferential mass in the terminal ileum or cecum worrisome  for adenocarcinoma or lymphoma. It is conceivable that the wall thickening may be related to hemorrhage in the wall of bowel, but it is rather nodular suggesting neoplasm. Electronically Signed   By: Marybelle Killings M.D.   On: 03/23/2018 08:17      Scheduled Meds: . sodium chloride   Intravenous Once  . allopurinol  300 mg Oral Daily  . docusate sodium  100 mg Oral BID  . fenofibrate  160 mg Oral Daily  . insulin aspart  0-9 Units Subcutaneous TID WC   Continuous Infusions: . sodium chloride 10 mL/hr at 03/20/18 0437  . sodium chloride 10 mL/hr at 03/23/18 2122  . heparin       LOS: 14 days    Time spent in minutes: 35    Debbe Odea, MD [facility] Pager: [email or web address] Password [facility] 03/24/2018, 12:14 PM

## 2018-03-24 NOTE — Progress Notes (Signed)
ANTICOAGULATION CONSULT NOTE - Consult  Pharmacy Consult for IV Heparin Indication: pulmonary embolus  Allergies  Allergen Reactions  . Ciprofloxacin Other (See Comments)    Leg tingling    Patient Measurements: Height: 5' 9"  (175.3 cm) Weight: 269 lb 8 oz (122.2 kg) IBW/kg (Calculated) : 70.7 Heparin Dosing Weight:   Vital Signs: Temp: 97.8 F (36.6 C) (07/12 0500) Temp Source: Oral (07/12 0500) BP: 127/70 (07/12 0500) Pulse Rate: 88 (07/12 0500)  Labs: Recent Labs    03/23/18 0500 03/23/18 1121 03/23/18 2212 03/24/18 0340 03/24/18 0612  HGB 7.6* 8.0*  --  7.7*  --   HCT 23.4* 24.2*  --  23.3*  --   PLT 147* 137*  --  126*  --   HEPARINUNFRC  --   --  0.73*  --  0.80*    Estimated Creatinine Clearance: 125.2 mL/min (by C-G formula based on SCr of 0.76 mg/dL).   Medical History: Past Medical History:  Diagnosis Date  . ALLERGIC RHINITIS   . Diabetes mellitus   . Hyperlipidemia     Medications:  Scheduled:  . allopurinol  300 mg Oral Daily  . docusate sodium  100 mg Oral BID  . fenofibrate  160 mg Oral Daily  . insulin aspart  0-9 Units Subcutaneous TID WC  . methotrexate INTRATHECAL (+/- HYDROCORTISONE,Ara-C)   Intrathecal Once     Assessment: 46 yoM went to his PCP office on 6/27 with c/o right leg edema and was sent home on xarelto 15 mg bid pending LE doppler.  Outpatient LE doppler showed acute right DVT involving the right saphenofemoral junction, right common femoral vein, posterior tibial and peroneal veins. He was sent to the ED on 6/28 for further workup and management of thromboembolism. Chest CTA on 6/28 showed acute bilateral PE with evidence of right heart strain and abd CT showed abdominal mass. Oncology consulted for intra-abdominal tumor. Heparin started on admission for acute DVT/PE.  Note hemoccult positive on admission.  Significant events: 7/1 - heparin off at 0800 (6 hrs) prior to IR biopsy. Resumed 4 hrs post procedure at 2100 7/3  - heparin held at 0700 prior to colonoscopy. 7/3 - Colonoscopy revealed large, friable, ulcerated mass of the right colon.  GI recommended be careful with anticoagulation, recommended heparin for now pending decision re: plan of care.  Dr. Ardis Hughs ok with resuming heparin at this time but will shoot for lower end therapeutic goal 7/4 - heparin drip stopped ~7P due to falling Hgb and bloody BM; transfused 2 units PRBC 7/5 - Underwent CT guided bone marrow biopsy and port-a-cath insertion. Hgb improved, heparin to be resumed with target low end of therapeutic range per Dr. Wyline Copas.  Heparin level was at goal yesterday on 2600 units/hr. 7/11 - Restarting heparin. Will aim for lower target range of 0.3 to 0.5 due to previous bleed during this admission 2212 HL= 0.73 at high end of therapeutic, no bleeding or infusion issues per RN. Today, 7/12 0612 HL= 0.80 now above goal, Verified with RN rate was d/c'd earlier and no bleeding or infusion issues.   Goal of Therapy:  Heparin level 0.3- 0.5 due to previous bleeding during admission Monitor platelets by anticoagulation protocol: Yes   Plan:   Will decrease heparin drip to 1800 units/hr  Recheck HL at 1330   Daily CBC     Dorrene German 03/24/2018 6:46 AM

## 2018-03-25 DIAGNOSIS — D500 Iron deficiency anemia secondary to blood loss (chronic): Secondary | ICD-10-CM

## 2018-03-25 LAB — CBC
HCT: 24.8 % — ABNORMAL LOW (ref 39.0–52.0)
Hemoglobin: 8.3 g/dL — ABNORMAL LOW (ref 13.0–17.0)
MCH: 28.7 pg (ref 26.0–34.0)
MCHC: 33.5 g/dL (ref 30.0–36.0)
MCV: 85.8 fL (ref 78.0–100.0)
PLATELETS: 114 10*3/uL — AB (ref 150–400)
RBC: 2.89 MIL/uL — AB (ref 4.22–5.81)
RDW: 16.4 % — AB (ref 11.5–15.5)
WBC: 1.9 10*3/uL — AB (ref 4.0–10.5)

## 2018-03-25 LAB — TYPE AND SCREEN
ABO/RH(D): O POS
ANTIBODY SCREEN: NEGATIVE
UNIT DIVISION: 0

## 2018-03-25 LAB — BPAM RBC
BLOOD PRODUCT EXPIRATION DATE: 201908082359
ISSUE DATE / TIME: 201907121310
Unit Type and Rh: 5100

## 2018-03-25 LAB — GLUCOSE, CAPILLARY
GLUCOSE-CAPILLARY: 151 mg/dL — AB (ref 70–99)
GLUCOSE-CAPILLARY: 188 mg/dL — AB (ref 70–99)
Glucose-Capillary: 161 mg/dL — ABNORMAL HIGH (ref 70–99)

## 2018-03-25 LAB — HEPARIN LEVEL (UNFRACTIONATED): Heparin Unfractionated: 0.31 IU/mL (ref 0.30–0.70)

## 2018-03-25 MED ORDER — ACETAMINOPHEN 325 MG PO TABS: 650.0000 mg | ORAL_TABLET | Freq: Four times a day (QID) | ORAL | Status: DC | PRN

## 2018-03-25 MED ORDER — ENOXAPARIN SODIUM 150 MG/ML ~~LOC~~ SOLN: 150.0000 mg | SUBCUTANEOUS | 0 refills | Status: DC

## 2018-03-25 MED ORDER — DOCUSATE SODIUM 100 MG PO CAPS: 100.0000 mg | ORAL_CAPSULE | Freq: Two times a day (BID) | ORAL | 0 refills | Status: DC

## 2018-03-25 MED ORDER — POLYETHYLENE GLYCOL 3350 17 G PO PACK: 17.0000 g | PACK | Freq: Every day | ORAL | 0 refills | Status: DC | PRN

## 2018-03-25 MED ORDER — ALLOPURINOL 300 MG PO TABS: 300.0000 mg | ORAL_TABLET | Freq: Every day | ORAL | 0 refills | Status: DC

## 2018-03-25 MED ORDER — ENOXAPARIN SODIUM 150 MG/ML ~~LOC~~ SOLN
150.0000 mg | SUBCUTANEOUS | Status: DC
  Administered 2018-03-25: 150 mg via SUBCUTANEOUS
  Filled 2018-03-25: qty 1

## 2018-03-25 MED ORDER — HEPARIN SOD (PORK) LOCK FLUSH 100 UNIT/ML IV SOLN
500.0000 [IU] | INTRAVENOUS | Status: AC | PRN
  Administered 2018-03-25: 500 [IU]

## 2018-03-25 NOTE — Discharge Summary (Addendum)
Physician Discharge Summary  Mark Frey VCB:[id number] DOB: 05/29/1956 DOA: 03/10/2018  PCP: [name], MD  Admit date: 03/10/2018 Discharge date: 03/25/2018  Admitted From: home  Disposition:  home   Recommendations for Outpatient Follow-up:  1. F/u with Dr Burr Medico    Discharge Condition: stable CODE STATUS:  Full code   Consultations:  Oncology    GI  Gen surgery   Discharge Diagnoses:  Principal Problem:   Bilateral pulmonary embolism ([place]) Active Problems:   Acute deep vein thrombosis (DVT) of femoral vein of right lower extremity (HCC)   High grade B-cell lymphoma    Hyperlipidemia   Morbid obesity ([place])   Controlled diabetes mellitus type II without complication (HCC)   Hypertension   Anemia   Hypoglycemia   Occult blood in stools    Brief Summary: Mark Frey 61 y.o.male with a history of diabetes mellitus 2 that presented secondary to DVT in the right leg found on ultrasound.  > Right lower extremity ispositivefor acutedeep vein thrombosis involving the right saphenofemoral junction, right common femoral vein, posterior tibial and peroneal veins.The right external and common iliac veins are patent with no obvious evidence of acute thrombosis.There is no evidence of right Baker's cyst   CTA of chest revealed: Acute bilateral pulmonary embolism involving lobar, segmental and subsegmental branches.  CT abd/pelvis revealed:  1. Large irregular infiltrative solid mass in the right lower quadrant measuring up to 18.7 x 18.5 x 17.8 cm, infiltrating and encasing multiple distal small bowel loops and likely the ileocecal region, partially encasing the sigmoid colon, with prominent extension into the right lower retroperitoneum and extraperitoneal right pelvis with encasement of right external iliac and proximal right common iliac vasculature and infiltration of the right iliopsoas muscle. 2. Mild right hydronephrosis due to thick soft tissue  encasing the right ureter most prominently at the right UPJ and right UVJ. 3. Nonobstructing right nephrolithiasis. 4. Moderate sigmoid diverticulosis. 5. Aortic Atherosclerosis (ICD10-I70.0).    Hospital Course:  Principal Problem:   Bilateral pulmonary embolism     Acute deep vein thrombosis (DVT) of femoral vein of right lower extremity   - due to GI bleed, Heparin infusion stopped and patient placed on s/c Heparin - discussed long term treatment plan with Dr Burr Medico- she requested a venogram shows: "the infiltrative mass in the right lower quadrant has largely improved- right hydronephrosis has also nearly resolved- persistent compression of the right common and external iliac veins due to residual retroperitoneal hemorrhage- Circumferential mass in the terminal ileum or cecum worrisome for adenocarcinoma or lymphoma." - Dr Burr Medico recommends resuming Heparin infusion and monitoring for further GI bleeding - he has not had any bleeding yet- he is being transitioned to 150 mg of Lovenox daily based on Dr Lewayne Bunting recommendation - cont TEDs to prevent post thrombotic syndrome     High grade B-cell lymphoma with bleeding- see above - s/p EGD and Colonoscopy on 7/3-  - found to have right abdominal mass infiltrating the proximal colon, mucosa was friable - biopsied>> 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     Controlled diabetes mellitus type II without complication - on Janumet, Amaryl at home - TID Novolog given  in hospital  Scrotal edema - ultrasound scrotum 1. Negative for testicular torsion or intratesticular mass 2. Bilateral hydroceles, small on the left and moderate on the right 3. Small right varicocele 4. Extensive scrotal edema and skin thickening  AKI - Cr rose to 1.66 and has improved to 0.76 with replacement of blood and IV hydration      Discharge Exam: Vitals:   03/25/18 0431 03/25/18 1401  BP: (!) 152/80 129/75  Pulse: 94 94  Resp: 16 18  Temp: 98.5 F (36.9 C) 98.8 F (37.1 C)  SpO2: 99% 99%   Vitals:   03/24/18 1650 03/24/18 2055 03/25/18 0431 03/25/18 1401  BP: 136/84 (!) 158/81 (!) 152/80 129/75  Pulse: 90 95 94 94  Resp: _0 Temp: 98.1 F (36.7 C) 98.4 F (36.9 C) 98.5 F (36.9 C) 98.8 F (37.1 C)  TempSrc: Oral Oral Oral Oral  SpO2:  96% 99% 99%  Weight:      Height:        General: Pt is alert, awake, not in acute distress Cardiovascular: RRR, S1/S2 +, no rubs, no gallops Respiratory: CTA bilaterally, no wheezing, no rhonchi Abdominal: Soft, NT, ND, bowel sounds + Extremities: edema of right leg noted    Discharge Instructions  Discharge Instructions    Diet - low sodium heart healthy   Complete by:  As directed    Diet Carb Modified   Complete by:  As directed    Increase activity slowly   Complete by:  As directed      Allergies as of 03/25/2018      Reactions   Ciprofloxacin Other (See Comments)   Leg tingling      Medication List    STOP taking these medications   aspirin 81 MG tablet   ibuprofen 200 MG tablet Commonly known as:  ADVIL,MOTRIN   RIVAROXABAN PO     TAKE these medications   acetaminophen 325 MG tablet Commonly known as:  TYLENOL Take 2 tablets (650 mg total) by mouth every 6 (six) hours as needed for mild pain (or Fever >/= 101).   allopurinol 300 MG tablet Commonly known as:   ZYLOPRIM Take 1 tablet (300 mg total) by mouth daily. Start taking on:  03/26/2018   Choline Fenofibrate 135 MG capsule TAKE 1 CAPSULE DAILY   docusate sodium 100 MG capsule Commonly known as:  COLACE Take 1 capsule (100 mg total) by mouth 2 (two) times daily.   enoxaparin 150 MG/ML injection Commonly known as:  LOVENOX Inject 1 mL (150 mg total) into the skin daily. Start taking on:  03/26/2018   freestyle lancets 1 each by Other route as directed. Use as instructed   FREESTYLE TEST STRIPS test strip Generic drug:  glucose blood 1 each by Other route daily. Use as instructed   glimepiride 2 MG tablet Commonly known as:  AMARYL Take 1 tablet (2 mg total) by mouth daily before breakfast. What changed:  how much to take   JANUMET 50-1000 MG tablet Generic drug:  sitaGLIPtin-metformin TAKE 1 TABLET TWICE DAILY  WITH MEALS   multivitamin capsule Take 1  capsule by mouth daily.   ondansetron 8 MG tablet Commonly known as:  ZOFRAN Take 1 tablet (8 mg total) by mouth 2 (two) times daily as needed (Nausea or vomiting).   polyethylene glycol packet Commonly known as:  MIRALAX / GLYCOLAX Take 17 g by mouth daily as needed for mild constipation.   prochlorperazine 10 MG tablet Commonly known as:  COMPAZINE Take 1 tablet (10 mg total) by mouth every 6 (six) hours as needed (Nausea or vomiting).       Allergies  Allergen Reactions  . Ciprofloxacin Other (See Comments)    Leg tingling     Procedures/Studies:  EGD/ Colonoscopy and biopsy 03/15/18 Ct Angio Chest Pe W/cm &/or Wo Cm  Result Date: 03/10/2018 CLINICAL DATA:  Acute DVT in the right lower extremity by report. Palpable right abdominal mass. EXAM: CT ANGIOGRAPHY CHEST CT ABDOMEN AND PELVIS WITH CONTRAST TECHNIQUE: Multidetector CT imaging of the chest was performed using the standard protocol during bolus administration of intravenous contrast. Multiplanar CT image reconstructions and MIPs were obtained to evaluate  the vascular anatomy. Multidetector CT imaging of the abdomen and pelvis was performed using the standard protocol during bolus administration of intravenous contrast. CONTRAST:  154m ISOVUE-370 IOPAMIDOL (ISOVUE-370) INJECTION 76% COMPARISON:  None. FINDINGS: CTA CHEST FINDINGS Cardiovascular: The study is moderate quality for the evaluation of pulmonary embolism, limited by motion degradation particularly in the lower lungs. There are lobar, segmental and subsegmental pulmonary emboli in the left upper, left lower and right upper lobes. There are segmental and subsegmental pulmonary emboli in the right lower lobe. No central or saddle pulmonary emboli. Dilated main pulmonary artery (3.5 cm diameter). Normal course and caliber of thoracic aorta. Top-normal heart size. Elevated RV/LV ratio 1.1. No significant pericardial fluid/thickening. Left anterior descending coronary atherosclerosis. Mediastinum/Nodes: No discrete thyroid nodules. Unremarkable esophagus. No pathologically enlarged axillary, mediastinal or hilar lymph nodes. Lungs/Pleura: No pneumothorax. No pleural effusion. Subpleural 2 mm basilar left lower lobe solid pulmonary nodule (series 4/image 122). No acute consolidative airspace disease, lung masses or additional significant pulmonary nodules. Musculoskeletal: No aggressive appearing focal osseous lesions. Moderate thoracic spondylosis. Review of the MIP images confirms the above findings. CT ABDOMEN and PELVIS FINDINGS Hepatobiliary: Normal liver size. Solitary subcentimeter hypodense right liver dome lesion (series 4/image 11), too small to characterize. No additional liver lesions. Normal gallbladder with no radiopaque cholelithiasis. No biliary ductal dilatation. Pancreas: Normal, with no mass or duct dilation. Spleen: Normal size. No mass. Adrenals/Urinary Tract: Normal adrenals. Mild right hydronephrosis with ill-defined soft tissue encasing the right ureteropelvic junction. Nonobstructing 5  mm stone in the posterior lower right kidney. No additional renal stones. No left hydronephrosis. A few subcentimeter hypodense renal cortical lesions scattered in the kidneys bilaterally, too small to characterize. Normal caliber left ureter. Masslike wall thickening in the distal pelvic segment of the right ureter extending to the right ureterovesical junction measuring up to 2.5 x 1.9 x 5.0 cm (series 4/image 72). Otherwise normal nondistended bladder. Stomach/Bowel: Normal non-distended stomach. There is a large irregular infiltrative solid mass in the right lower quadrant measuring up to 18.7 x 18.5 x 17.8 cm (series 4/image 57), which infiltrates and encases multiple distal small bowel loops and likely the ileocecal region. There is partial encasement of the sigmoid colon by the mass. There is prominent extension of the mass into the right lower retroperitoneum and extraperitoneal right pelvis with encasement of the right external iliac and proximal right common iliac vasculature and with infiltration of the  right iliopsoas muscle. There is dilatation of the involved right sided small bowel loops. No proximal small bowel dilatation. The transverse and left colon are collapsed with no wall thickening in these locations. Moderate sigmoid diverticulosis. Vascular/Lymphatic: Atherosclerotic nonaneurysmal abdominal aorta. Patent portal, splenic, hepatic and renal veins. There is bulky soft tissue throughout right external and common iliac nodal chains. Reproductive: Top-normal size prostate with coarse nonspecific internal prostatic calcifications. Other: No pneumoperitoneum, ascites or focal fluid collection. Trace presacral fluid. Asymmetric fat stranding throughout subcutaneous right proximal lower extremity and right flank. Musculoskeletal: No aggressive appearing focal osseous lesions. Moderate lumbar spondylosis. Review of the MIP images confirms the above findings. IMPRESSION: CT CHEST: 1. Acute bilateral  pulmonary embolism involving lobar, segmental and subsegmental branches. No saddle pulmonary emboli. Positive for acute PE with CT evidence of right heart strain (RV/LV Ratio = 1.1) consistent with at least submassive (intermediate risk) PE. The presence of right heart strain has been associated with an increased risk of morbidity and mortality. Please activate Code PE by paging [phone]. 2. Dilated main pulmonary artery, suggesting pulmonary arterial hypertension. 3. 1 vessel coronary atherosclerosis. 4. No findings suspicious for metastatic disease in the chest. Solitary 2 mm subpleural left lower lobe solid pulmonary nodule, for which follow-up chest CT is advised in 3 months. CT ABDOMEN and PELVIS: 1. Large irregular infiltrative solid mass in the right lower quadrant measuring up to 18.7 x 18.5 x 17.8 cm, infiltrating and encasing multiple distal small bowel loops and likely the ileocecal region, partially encasing the sigmoid colon, with prominent extension into the right lower retroperitoneum and extraperitoneal right pelvis with encasement of right external iliac and proximal right common iliac vasculature and infiltration of the right iliopsoas muscle. 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 Differential includes small bowel GIST or adenocarcinoma. 2. Mild right hydronephrosis due to thick soft tissue encasing the right ureter most prominently at the right UPJ and right UVJ. 3. Nonobstructing right nephrolithiasis. 4. Moderate sigmoid diverticulosis. 5.  Aortic Atherosclerosis (ICD10-I70.0). Critical Value/emergent results were called by telephone at the time of interpretation on 03/10/2018 at 5:38 pm to Dr. Kennith Maes , who verbally acknowledged these results. Electronically Signed   By: Ilona Sorrel M.D.   On: 03/10/2018 17:41   US Scrotum  Result Date: 03/15/2018 CLINICAL DATA:  Scrotal swelling EXAM: SCROTAL ULTRASOUND DOPPLER ULTRASOUND OF THE TESTICLES TECHNIQUE: Complete ultrasound  examination of the testicles, epididymis, and other scrotal structures was performed. Color and spectral Doppler ultrasound were also utilized to evaluate blood flow to the testicles. COMPARISON:  CT 03/10/2018 FINDINGS: Right testicle Measurements: 4.2 x 2.5 x 2.6 cm. No mass or microlithiasis visualized. Left testicle Measurements: 4.2 x 2.5 x 2.7 cm. No mass or microlithiasis visualized. Right epididymis:  Cyst measuring 9 x 8 x 11 mm Left epididymis:  Cyst measuring 6 x 4 mm Hydrocele:  Small left hydrocele.  Moderate right hydrocele. Varicocele:  Small right varicocele. Significant scrotal skin thickening with diffuse edema in the soft tissues Pulsed Doppler interrogation of both testes demonstrates normal low resistance arterial and venous waveforms bilaterally. IMPRESSION: 1. Negative for testicular torsion or intratesticular mass 2. Bilateral hydroceles, small on the left and moderate on the right 3. Small right varicocele 4. Extensive scrotal edema and skin thickening Electronically Signed   By: Donavan Foil M.D.   On: 03/15/2018 20:19   Ct Abdomen Pelvis W Contrast  Result Date: 03/10/2018 CLINICAL DATA:  Acute DVT in the right lower extremity by report. Palpable right abdominal  mass. EXAM: CT ANGIOGRAPHY CHEST CT ABDOMEN AND PELVIS WITH CONTRAST TECHNIQUE: Multidetector CT imaging of the chest was performed using the standard protocol during bolus administration of intravenous contrast. Multiplanar CT image reconstructions and MIPs were obtained to evaluate the vascular anatomy. Multidetector CT imaging of the abdomen and pelvis was performed using the standard protocol during bolus administration of intravenous contrast. CONTRAST:  123m ISOVUE-370 IOPAMIDOL (ISOVUE-370) INJECTION 76% COMPARISON:  None. FINDINGS: CTA CHEST FINDINGS Cardiovascular: The study is moderate quality for the evaluation of pulmonary embolism, limited by motion degradation particularly in the lower lungs. There are lobar,  segmental and subsegmental pulmonary emboli in the left upper, left lower and right upper lobes. There are segmental and subsegmental pulmonary emboli in the right lower lobe. No central or saddle pulmonary emboli. Dilated main pulmonary artery (3.5 cm diameter). Normal course and caliber of thoracic aorta. Top-normal heart size. Elevated RV/LV ratio 1.1. No significant pericardial fluid/thickening. Left anterior descending coronary atherosclerosis. Mediastinum/Nodes: No discrete thyroid nodules. Unremarkable esophagus. No pathologically enlarged axillary, mediastinal or hilar lymph nodes. Lungs/Pleura: No pneumothorax. No pleural effusion. Subpleural 2 mm basilar left lower lobe solid pulmonary nodule (series 4/image 122). No acute consolidative airspace disease, lung masses or additional significant pulmonary nodules. Musculoskeletal: No aggressive appearing focal osseous lesions. Moderate thoracic spondylosis. Review of the MIP images confirms the above findings. CT ABDOMEN and PELVIS FINDINGS Hepatobiliary: Normal liver size. Solitary subcentimeter hypodense right liver dome lesion (series 4/image 11), too small to characterize. No additional liver lesions. Normal gallbladder with no radiopaque cholelithiasis. No biliary ductal dilatation. Pancreas: Normal, with no mass or duct dilation. Spleen: Normal size. No mass. Adrenals/Urinary Tract: Normal adrenals. Mild right hydronephrosis with ill-defined soft tissue encasing the right ureteropelvic junction. Nonobstructing 5 mm stone in the posterior lower right kidney. No additional renal stones. No left hydronephrosis. A few subcentimeter hypodense renal cortical lesions scattered in the kidneys bilaterally, too small to characterize. Normal caliber left ureter. Masslike wall thickening in the distal pelvic segment of the right ureter extending to the right ureterovesical junction measuring up to 2.5 x 1.9 x 5.0 cm (series 4/image 72). Otherwise normal nondistended  bladder. Stomach/Bowel: Normal non-distended stomach. There is a large irregular infiltrative solid mass in the right lower quadrant measuring up to 18.7 x 18.5 x 17.8 cm (series 4/image 57), which infiltrates and encases multiple distal small bowel loops and likely the ileocecal region. There is partial encasement of the sigmoid colon by the mass. There is prominent extension of the mass into the right lower retroperitoneum and extraperitoneal right pelvis with encasement of the right external iliac and proximal right common iliac vasculature and with infiltration of the right iliopsoas muscle. There is dilatation of the involved right sided small bowel loops. No proximal small bowel dilatation. The transverse and left colon are collapsed with no wall thickening in these locations. Moderate sigmoid diverticulosis. Vascular/Lymphatic: Atherosclerotic nonaneurysmal abdominal aorta. Patent portal, splenic, hepatic and renal veins. There is bulky soft tissue throughout right external and common iliac nodal chains. Reproductive: Top-normal size prostate with coarse nonspecific internal prostatic calcifications. Other: No pneumoperitoneum, ascites or focal fluid collection. Trace presacral fluid. Asymmetric fat stranding throughout subcutaneous right proximal lower extremity and right flank. Musculoskeletal: No aggressive appearing focal osseous lesions. Moderate lumbar spondylosis. Review of the MIP images confirms the above findings. IMPRESSION: CT CHEST: 1. Acute bilateral pulmonary embolism involving lobar, segmental and subsegmental branches. No saddle pulmonary emboli. Positive for acute PE with CT evidence of right heart strain (  RV/LV Ratio = 1.1) consistent with at least submassive (intermediate risk) PE. The presence of right heart strain has been associated with an increased risk of morbidity and mortality. Please activate Code PE by paging [phone]. 2. Dilated main pulmonary artery, suggesting pulmonary  arterial hypertension. 3. 1 vessel coronary atherosclerosis. 4. No findings suspicious for metastatic disease in the chest. Solitary 2 mm subpleural left lower lobe solid pulmonary nodule, for which follow-up chest CT is advised in 3 months. CT ABDOMEN and PELVIS: 1. Large irregular infiltrative solid mass in the right lower quadrant measuring up to 18.7 x 18.5 x 17.8 cm, infiltrating and encasing multiple distal small bowel loops and likely the ileocecal region, partially encasing the sigmoid colon, with prominent extension into the right lower retroperitoneum and extraperitoneal right pelvis with encasement of right external iliac and proximal right common iliac vasculature and infiltration of the right iliopsoas muscle. 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 Differential includes small bowel GIST or adenocarcinoma. 2. Mild right hydronephrosis due to thick soft tissue encasing the right ureter most prominently at the right UPJ and right UVJ. 3. Nonobstructing right nephrolithiasis. 4. Moderate sigmoid diverticulosis. 5.  Aortic Atherosclerosis (ICD10-I70.0). Critical Value/emergent results were called by telephone at the time of interpretation on 03/10/2018 at 5:38 pm to Dr. Kennith Maes , who verbally acknowledged these results. Electronically Signed   By: Ilona Sorrel M.D.   On: 03/10/2018 17:41   Ct Biopsy  Result Date: 03/17/2018 INDICATION: Recent diagnosis of lymphoma. Please perform CT-guided bone marrow biopsy for tissue diagnostic purposes. EXAM: CT-GUIDED BONE MARROW BIOPSY AND ASPIRATION MEDICATIONS: None ANESTHESIA/SEDATION: Fentanyl 100 mcg IV; Versed 2 mg IV Sedation Time: 10 Minutes; The patient was continuously monitored during the procedure by the interventional radiology nurse under my direct supervision. COMPLICATIONS: None immediate. PROCEDURE: Informed consent was obtained from the patient following an explanation of the procedure, risks, benefits and alternatives. The patient understands,  agrees and consents for the procedure. All questions were addressed. A time out was performed prior to the initiation of the procedure. The patient was positioned left lateral decubitus and non-contrast localization CT was performed of the pelvis to demonstrate the iliac marrow spaces. The operative site was prepped and draped in the usual sterile fashion. Under sterile conditions and local anesthesia, a 22 gauge spinal needle was utilized for procedural planning. Next, an 11 gauge coaxial bone biopsy needle was advanced into the left iliac marrow space. Needle position was confirmed with CT imaging. Initially, bone marrow aspiration was performed. Next, a bone marrow biopsy was obtained with the 11 gauge outer bone marrow device. Samples were prepared with the cytotechnologist and deemed adequate. The needle was removed intact. Hemostasis was obtained with compression and a dressing was placed. The patient tolerated the procedure well without immediate post procedural complication. IMPRESSION: Successful CT guided left iliac bone marrow aspiration and core biopsy. Electronically Signed   By: Sandi Mariscal M.D.   On: 03/17/2018 10:41   Ct Biopsy  Result Date: 03/13/2018 CLINICAL DATA:  Large infiltrative masses of the peritoneal cavity. EXAM: CT GUIDED CORE BIOPSY OF PERITONEAL MASS ANESTHESIA/SEDATION: 2.0 mg IV Versed; 100 mcg IV Fentanyl Total Moderate Sedation Time:  17 minutes. The patient's level of consciousness and physiologic status were continuously monitored during the procedure by Radiology nursing. PROCEDURE: The procedure risks, benefits, and alternatives were explained to the patient. Questions regarding the procedure were encouraged and answered. The patient understands and consents to the procedure. A time-out was performed prior to initiating  the procedure. The operative field was prepped with chlorhexidine in a sterile fashion, and a sterile drape was applied covering the operative field. A  sterile gown and sterile gloves were used for the procedure. Local anesthesia was provided with 1% Lidocaine. Under CT guidance, a 17 gauge trocar needle was advanced into the right lower peritoneal cavity. Five separate coaxial 18 gauge core biopsy samples were obtained and submitted in both saline and formalin. Additional CT imaging was performed after outer needle removal. COMPLICATIONS: None FINDINGS: Right lower quadrant portion bulky peritoneal tumor was chosen for sampling. Solid tumor was obtained. There were no immediate complications. IMPRESSION: CT-guided core biopsy performed of bulky tumor in the right lower peritoneal cavity. Electronically Signed   By: Aletta Edouard M.D.   On: 03/13/2018 17:36   Ct Bone Marrow Biopsy  Result Date: 03/17/2018 INDICATION: Recent diagnosis of lymphoma. Please perform CT-guided bone marrow biopsy for tissue diagnostic purposes. EXAM: CT-GUIDED BONE MARROW BIOPSY AND ASPIRATION MEDICATIONS: None ANESTHESIA/SEDATION: Fentanyl 100 mcg IV; Versed 2 mg IV Sedation Time: 10 Minutes; The patient was continuously monitored during the procedure by the interventional radiology nurse under my direct supervision. COMPLICATIONS: None immediate. PROCEDURE: Informed consent was obtained from the patient following an explanation of the procedure, risks, benefits and alternatives. The patient understands, agrees and consents for the procedure. All questions were addressed. A time out was performed prior to the initiation of the procedure. The patient was positioned left lateral decubitus and non-contrast localization CT was performed of the pelvis to demonstrate the iliac marrow spaces. The operative site was prepped and draped in the usual sterile fashion. Under sterile conditions and local anesthesia, a 22 gauge spinal needle was utilized for procedural planning. Next, an 11 gauge coaxial bone biopsy needle was advanced into the left iliac marrow space. Needle position was  confirmed with CT imaging. Initially, bone marrow aspiration was performed. Next, a bone marrow biopsy was obtained with the 11 gauge outer bone marrow device. Samples were prepared with the cytotechnologist and deemed adequate. The needle was removed intact. Hemostasis was obtained with compression and a dressing was placed. The patient tolerated the procedure well without immediate post procedural complication. IMPRESSION: Successful CT guided left iliac bone marrow aspiration and core biopsy. Electronically Signed   By: Sandi Mariscal M.D.   On: 03/17/2018 10:41   Dg Fluoro Guided Loc Of Needle/cath Tip For Spinal Inject Rt  Result Date: 03/21/2018 CLINICAL DATA:  62 year old male with history of high-grade B-cell lymphoma. EXAM: FLUOROSCOPICALLY GUIDED LUMBAR PUNCTURE FOR INTRATHECAL CHEMOTHERAPY TECHNIQUE: Informed consent was obtained from the patient prior to the procedure, including potential complications of headache, allergy, and pain. A 'time out' was performed. With the patient prone, the lower back was prepped with Betadine. 1% Lidocaine was used for local anesthesia. Lumbar puncture was performed at the L2-L3 level using a 20 gauge needle with return of clear CSF. A total of 8 mL of CSF was collected into sterile tubing and sent to the laboratory for testing and analysis per orders of the primary medical team. Subsequently, 5 mL of methotrexate was injected into the subarachnoid space. The patient tolerated the procedure well without apparent complication. FLUOROSCOPY TIME:  48 seconds IMPRESSION: 1. Intrathecal injection of chemotherapy without complication. Electronically Signed   By: Vinnie Langton M.D.   On: 03/21/2018 14:39   US Scrotum Doppler  Result Date: 03/15/2018 CLINICAL DATA:  Scrotal swelling EXAM: SCROTAL ULTRASOUND DOPPLER ULTRASOUND OF THE TESTICLES TECHNIQUE: Complete ultrasound examination of  the testicles, epididymis, and other scrotal structures was performed. Color and  spectral Doppler ultrasound were also utilized to evaluate blood flow to the testicles. COMPARISON:  CT 03/10/2018 FINDINGS: Right testicle Measurements: 4.2 x 2.5 x 2.6 cm. No mass or microlithiasis visualized. Left testicle Measurements: 4.2 x 2.5 x 2.7 cm. No mass or microlithiasis visualized. Right epididymis:  Cyst measuring 9 x 8 x 11 mm Left epididymis:  Cyst measuring 6 x 4 mm Hydrocele:  Small left hydrocele.  Moderate right hydrocele. Varicocele:  Small right varicocele. Significant scrotal skin thickening with diffuse edema in the soft tissues Pulsed Doppler interrogation of both testes demonstrates normal low resistance arterial and venous waveforms bilaterally. IMPRESSION: 1. Negative for testicular torsion or intratesticular mass 2. Bilateral hydroceles, small on the left and moderate on the right 3. Small right varicocele 4. Extensive scrotal edema and skin thickening Electronically Signed   By: Donavan Foil M.D.   On: 03/15/2018 20:19   Ct Venogram Abd/pel  Result Date: 03/23/2018 CLINICAL DATA:  Right lower extremity DVT EXAM: CT VENOGRAM ABD-PELVIS TECHNIQUE: Multidetector CT imaging of the abdomen and pelvis was performed using the standard protocol during bolus administration of intravenous contrast. Multiplanar reconstructed images and MIPs were obtained and reviewed to evaluate the venous anatomy. CONTRAST:  112m ISOVUE-300 IOPAMIDOL (ISOVUE-300) INJECTION 61% COMPARISON:  03/10/2018 FINDINGS: VASCULAR Veins: Hepatic veins, portal vein, and splenic vein are patent. Superior mesenteric vein is patent. IVC is patent.  Renal veins are patent. Left common and external iliac veins are patent. The right common and external iliac veins are patent. There is compression of the right common and external iliac veins secondary to residual retroperitoneal hemorrhage and/or infiltrative mass. There is low-density filling defect in the right common femoral vein compatible with DVT. The right femoral  vein is grossly patent. No evidence of left common femoral or femoral DVT. Review of the MIP images confirms the above findings. NON-VASCULAR Lower chest: Bibasilar atelectasis for scarring. Hepatobiliary: Liver is unremarkable.  Gallbladder is decompressed. Pancreas: Unremarkable Spleen: Unremarkable Adrenals/Urinary Tract: Nonspecific hypodensity in the mid right kidney. Left kidney is within normal limits. Right hydronephrosis has nearly resolved. Small calculus in the posterior lower pole of the right kidney is unchanged. Stomach/Bowel: Severe wall thickening of a loop of bowel in the right lower quadrant either represents the terminal ileum or cecum. Wall thickening is very nodular and this is worrisome for adenocarcinoma or lymphoma. There is no evidence of small-bowel obstruction. The appendix is not readily visualized. There is no definite extraluminal bowel gas to suggest perforation. Stomach and duodenum are unremarkable. Lymphatic: There is no abnormal retroperitoneal adenopathy. Mild atherosclerotic calcification of the aorta without aneurysm. Reproductive: Prostate is unremarkable other than calcifications. Other: On the prior study, there was ill-defined and infiltrative soft tissue density throughout the right lower quadrant of the abdomen and extending into the right iliopsoas musculature and right inguinal region. This soft tissue density has largely resolved with some residual stranding throughout the retroperitoneum. The large infiltrative mass is now simply replaced by stranding and ill-defined bowel loops. Expansion of the right iliopsoas musculature has also improved. There is stranding throughout the musculature of the proximal right thigh which is stable. Due to the dramatic improvement of the infiltrative mass, this may represent a dramatic response to chemotherapy if this correlates with the given history. Alternatively, the ill-defined mass may represent hemorrhage which has evolved.  Musculoskeletal: No vertebral compression deformity. IMPRESSION: VASCULAR There is no evidence of DVT in the  abdomen or pelvis. Specifically, there is no evidence of right common or external iliac vein DVT. The study is positive for DVT in the right common femoral vein. NON-VASCULAR The large infiltrative mass in the right lower quadrant has largely improved. The dramatic improvement either represents a rapid response to chemotherapy or possibly evolving hemorrhage. Correlate clinically with vital signs and hemoglobin levels. The right hydronephrosis has also nearly resolved. There is persistent compression of the right common and external iliac veins due to residual retroperitoneal hemorrhage. Circumferential mass in the terminal ileum or cecum worrisome for adenocarcinoma or lymphoma. It is conceivable that the wall thickening may be related to hemorrhage in the wall of bowel, but it is rather nodular suggesting neoplasm. Electronically Signed   By: Marybelle Killings M.D.   On: 03/23/2018 08:17   Ir Imaging Guided Port Insertion  Result Date: 03/17/2018 INDICATION: Recent diagnosis of lymphoma. In need of durable intravenous access for chemotherapy administration. EXAM: IMPLANTED PORT A CATH PLACEMENT WITH ULTRASOUND AND FLUOROSCOPIC GUIDANCE COMPARISON:  CT the chest, abdomen and pelvis-03/10/2018 MEDICATIONS: Ancef 2 gm IV; The antibiotic was administered within an appropriate time interval prior to skin puncture. ANESTHESIA/SEDATION: Moderate (conscious) sedation was employed during this procedure. A total of Versed 0.5 mg and Fentanyl 25 mcg was administered intravenously. Moderate Sedation Time: 26 minutes. The patient's level of consciousness and vital signs were monitored continuously by radiology nursing throughout the procedure under my direct supervision. CONTRAST:  None FLUOROSCOPY TIME:  12 seconds (5 mGy) COMPLICATIONS: None immediate. PROCEDURE: The procedure, risks, benefits, and alternatives were  explained to the patient. Questions regarding the procedure were encouraged and answered. The patient understands and consents to the procedure. The right neck and chest were prepped with chlorhexidine in a sterile fashion, and a sterile drape was applied covering the operative field. Maximum barrier sterile technique with sterile gowns and gloves were used for the procedure. A timeout was performed prior to the initiation of the procedure. Local anesthesia was provided with 1% lidocaine with epinephrine. After creating a small venotomy incision, a micropuncture kit was utilized to access the internal jugular vein. Real-time ultrasound guidance was utilized for vascular access including the acquisition of a permanent ultrasound image documenting patency of the accessed vessel. The microwire was utilized to measure appropriate catheter length. A subcutaneous port pocket was then created along the upper chest wall utilizing a combination of sharp and blunt dissection. The pocket was irrigated with sterile saline. A single lumen ISP power injectable port was chosen for placement. The 8 Fr catheter was tunneled from the port pocket site to the venotomy incision. The port was placed in the pocket. The external catheter was trimmed to appropriate length. At the venotomy, an 8 Fr peel-away sheath was placed over a guidewire under fluoroscopic guidance. The catheter was then placed through the sheath and the sheath was removed. Final catheter positioning was confirmed and documented with a fluoroscopic spot radiograph. The port was accessed with a Huber needle and was noted to easily aspirate and flush. The Port a Catheter was left accessed for the initiation of chemotherapy later today. The venotomy site was closed with an interrupted 4-0 Vicryl suture. The port pocket incision was closed with interrupted 2-0 Vicryl suture and the skin was opposed with a running subcuticular 4-0 Vicryl suture. Dermabond and Steri-strips  were applied to both incisions. Dressings were placed. The patient tolerated the procedure well without immediate post procedural complication. FINDINGS: After catheter placement, the tip lies within the  superior cavoatrial junction. The catheter aspirates and flushes normally and is ready for immediate use. IMPRESSION: Successful placement of a right internal jugular approach power injectable Port-A-Cath. The catheter is ready for immediate use. Electronically Signed   By: Sandi Mariscal M.D.   On: 03/17/2018 10:42     The results of significant diagnostics from this hospitalization (including imaging, microbiology, ancillary and laboratory) are listed below for reference.     Microbiology: No results found for this or any previous visit (from the past 240 hour(s)).   Labs: BNP (last 3 results) No results for input(s): BNP in the last 8760 hours. Basic Metabolic Panel: Recent Labs  Lab 03/19/18 0501 03/20/18 0533 03/21/18 0559  NA 138  --  138  K 4.8  --  4.5  CL 108  --  108  CO2 23  --  25  GLUCOSE 187*  --  156*  BUN 36*  --  28*  CREATININE 0.98  --  0.76  CALCIUM 8.1*  --  8.4*  MG 2.4 2.0  --    Liver Function Tests: Recent Labs  Lab 03/21/18 0559  AST 17  ALT 26  ALKPHOS 36*  BILITOT 0.6  PROT 5.3*  ALBUMIN 2.5*   No results for input(s): LIPASE, AMYLASE in the last 168 hours. No results for input(s): AMMONIA in the last 168 hours. CBC: Recent Labs  Lab 03/20/18 0533  03/21/18 0559 03/22/18 0500 03/23/18 0500 03/23/18 1121 03/24/18 0340 03/24/18 2035 03/25/18 0401  WBC 7.0  --  6.2 5.9 3.6* 3.5* 3.1*  --  1.9*  NEUTROABS 6.2  --  5.3 5.5 3.0  --  2.2  --   --   HGB 7.0*   < > 7.9* 8.1* 7.6* 8.0* 7.7* 8.7* 8.3*  HCT 22.1*   < > 24.1* 24.9* 23.4* 24.2* 23.3* 26.2* 24.8*  MCV 86.0  --  85.8 85.6 86.0 86.1 86.3  --  85.8  PLT 324  --  271 243 147* 137* 126*  --  114*   < > = values in this interval not displayed.   Cardiac Enzymes: No results for  input(s): CKTOTAL, CKMB, CKMBINDEX, TROPONINI in the last 168 hours. BNP: Invalid input(s): POCBNP CBG: Recent Labs  Lab 03/24/18 1207 03/24/18 1723 03/24/18 2055 03/25/18 0732 03/25/18 1209  GLUCAP 126* 146* 170* 151* 188*   D-Dimer No results for input(s): DDIMER in the last 72 hours. Hgb A1c No results for input(s): HGBA1C in the last 72 hours. Lipid Profile No results for input(s): CHOL, HDL, LDLCALC, TRIG, CHOLHDL, LDLDIRECT in the last 72 hours. Thyroid function studies No results for input(s): TSH, T4TOTAL, T3FREE, THYROIDAB in the last 72 hours.  Invalid input(s): FREET3 Anemia work up No results for input(s): VITAMINB12, FOLATE, FERRITIN, TIBC, IRON, RETICCTPCT in the last 72 hours. Urinalysis    Component Value Date/Time   COLORURINE yellow 01/20/2009 0840   APPEARANCEUR Clear 01/20/2009 0840   LABSPEC 1.010 01/20/2009 0840   PHURINE 5.5 01/20/2009 0840   HGBUR negative 01/20/2009 0840   BILIRUBINUR Small 01/30/2018 1652   PROTEINUR Positive (A) 01/30/2018 1652   UROBILINOGEN 2.0 (A) 01/30/2018 1652   UROBILINOGEN 0.2 01/20/2009 0840   NITRITE Negative 01/30/2018 1652   NITRITE negative 01/20/2009 0840   LEUKOCYTESUR Negative 01/30/2018 1652   Sepsis Labs Invalid input(s): PROCALCITONIN,  WBC,  LACTICIDVEN Microbiology No results found for this or any previous visit (from the past 240 hour(s)).   Time coordinating discharge in minutes: 65  SIGNED:   Debbe Odea, MD  [facility] 03/25/2018, 3:06 PM Pager   If 7PM-7AM, please contact night-coverage [email or web address] Password [email or web address]

## 2018-03-25 NOTE — Care Management Note (Addendum)
Case Management Note  Patient Details  Name: CHRISTIA COAXUM MRN: [id number] Date of Birth: Apr 19, 1956  Subjective/Objective: 62 yo M with DM2 controlled. He was found to have an abdominal mass, RLE DVT and bilateral PEs. PMH: HLD, morbidly obese, HTN, anemia, B-cell lymphoma.             Action/Plan: Received referral to assist with DME   Expected Discharge Date:  03/25/18               Expected Discharge Plan:  Home/Self Care  In-House Referral:     Discharge planning Services  CM Consult  Post Acute Care Choice:  Durable Medical Equipment Choice offered to:     DME Arranged:  3-N-1(Barriatric rollator) DME Agency:  [place]:    [facility] Agency:     Status of Service:  Completed, signed off  If discussed at [employer] of Stay Meetings, dates discussed:    Additional Comments: Pt needs a bariatric rollator and a bariatric BSC. Contacted Reggie at [facility] for DME referral. The DME is going to be deliver since the rollator is available at the main office. RN made aware that the DME is going to be deliver next week.     Norina Buzzard, RN 03/25/2018, 4:35 PM

## 2018-03-25 NOTE — Progress Notes (Signed)
Discharge instructions and Lovenox teaching reviewed with patient and wife. Wife demonstrated return demonstration with insulin administration in preparation for home Lovenox administration. Patient discharge via private vehicle.

## 2018-03-25 NOTE — Progress Notes (Signed)
ANTICOAGULATION CONSULT NOTE - Consult  Pharmacy Consult for IV Heparin Indication: pulmonary embolus  Allergies  Allergen Reactions  . Ciprofloxacin Other (See Comments)    Leg tingling    Patient Measurements: Height: 5' 9" (175.3 cm) Weight: 269 lb 8 oz (122.2 kg) IBW/kg (Calculated) : 70.7 Heparin Dosing Weight:   Vital Signs: Temp: 98.5 F (36.9 C) (07/13 0431) Temp Source: Oral (07/13 0431) BP: 152/80 (07/13 0431) Pulse Rate: 94 (07/13 0431)  Labs: Recent Labs    03/23/18 1121  03/24/18 0340 03/24/18 0612 03/24/18 2035 03/25/18 0401  HGB 8.0*  --  7.7*  --  8.7* 8.3*  HCT 24.2*  --  23.3*  --  26.2* 24.8*  PLT 137*  --  126*  --   --  PENDING  HEPARINUNFRC  --    < >  --  0.80* 0.30 0.31   < > = values in this interval not displayed.    Estimated Creatinine Clearance: 125.2 mL/min (by C-G formula based on SCr of 0.76 mg/dL).   Medical History: Past Medical History:  Diagnosis Date  . ALLERGIC RHINITIS   . Diabetes mellitus   . Hyperlipidemia     Medications:  Scheduled:  . allopurinol  300 mg Oral Daily  . docusate sodium  100 mg Oral BID  . fenofibrate  160 mg Oral Daily  . insulin aspart  0-9 Units Subcutaneous TID WC     Assessment: 67 yoM went to his PCP office on 6/27 with c/o right leg edema and was sent home on xarelto 15 mg bid pending LE doppler.  Outpatient LE doppler showed acute right DVT involving the right saphenofemoral junction, right common femoral vein, posterior tibial and peroneal veins. He was sent to the ED on 6/28 for further workup and management of thromboembolism. Chest CTA on 6/28 showed acute bilateral PE with evidence of right heart strain and abd CT showed abdominal mass. Oncology consulted for intra-abdominal tumor. Heparin started on admission for acute DVT/PE.  Note hemoccult positive on admission.  Significant events: 7/1 - heparin off at 0800 (6 hrs) prior to IR biopsy. Resumed 4 hrs post procedure at 2100 7/3 -  heparin held at 0700 prior to colonoscopy. 7/3 - Colonoscopy revealed large, friable, ulcerated mass of the right colon.  GI recommended be careful with anticoagulation, recommended heparin for now pending decision re: plan of care.  Dr. Ardis Hughs ok with resuming heparin at this time but will shoot for lower end therapeutic goal 7/4 - heparin drip stopped ~7P due to falling Hgb and bloody BM; transfused 2 units PRBC 7/5 - Underwent CT guided bone marrow biopsy and port-a-cath insertion. Hgb improved, heparin to be resumed with target low end of therapeutic range per Dr. Wyline Copas.  Heparin level was at goal yesterday on 2600 units/hr. 7/11 - Restarting heparin. Will aim for lower target range of 0.3 to 0.5 due to previous bleed during this admission   7/12  Heparin level due at 13:30 not drawn until 20:35 due to several issues (labeled as RN draw but needs to be peripheral draw as heparin infusing via PAC then receiving PRBC and unable to draw labs)  Heparin level this evening = 0.3 (low end goal)  Hgb after PRBC improved 7.7 to 8.7  RN reported large black BM this evening but no frank blood Today, 7/13  0401 HL=0.31 at goal, no infusion or bleeding per RN  Hgb=8.3 ( down slightly from 8.7)  Goal of Therapy:  Heparin level  0.3- 0.5 due to previous bleeding during admission Monitor platelets by anticoagulation protocol: Yes   Plan:   Continue heparin drip at 1800 units/hr  Check daily heparin level  Daily CBC - follow Hgb closely  Follow for GIB, stools   ,  R 03/25/2018 5:02 AM     

## 2018-03-26 ENCOUNTER — Other Ambulatory Visit: Payer: Self-pay | Admitting: Hematology

## 2018-03-26 DIAGNOSIS — C8510 Unspecified B-cell lymphoma, unspecified site: Secondary | ICD-10-CM

## 2018-03-27 ENCOUNTER — Inpatient Hospital Stay: Payer: BLUE CROSS/BLUE SHIELD

## 2018-03-27 ENCOUNTER — Other Ambulatory Visit: Payer: Self-pay | Admitting: Hematology

## 2018-03-27 ENCOUNTER — Inpatient Hospital Stay: Payer: BLUE CROSS/BLUE SHIELD | Attending: Hematology | Admitting: Hematology

## 2018-03-27 ENCOUNTER — Encounter: Payer: Self-pay | Admitting: Hematology

## 2018-03-27 ENCOUNTER — Telehealth: Payer: Self-pay

## 2018-03-27 VITALS — BP 114/64 | HR 100 | Temp 98.9°F | Resp 18 | Ht 69.0 in | Wt 247.3 lb

## 2018-03-27 DIAGNOSIS — I82491 Acute embolism and thrombosis of other specified deep vein of right lower extremity: Secondary | ICD-10-CM | POA: Diagnosis not present

## 2018-03-27 DIAGNOSIS — R1903 Right lower quadrant abdominal swelling, mass and lump: Secondary | ICD-10-CM | POA: Diagnosis not present

## 2018-03-27 DIAGNOSIS — E785 Hyperlipidemia, unspecified: Secondary | ICD-10-CM | POA: Insufficient documentation

## 2018-03-27 DIAGNOSIS — D709 Neutropenia, unspecified: Secondary | ICD-10-CM | POA: Diagnosis not present

## 2018-03-27 DIAGNOSIS — D696 Thrombocytopenia, unspecified: Secondary | ICD-10-CM | POA: Insufficient documentation

## 2018-03-27 DIAGNOSIS — C8510 Unspecified B-cell lymphoma, unspecified site: Secondary | ICD-10-CM

## 2018-03-27 DIAGNOSIS — K922 Gastrointestinal hemorrhage, unspecified: Secondary | ICD-10-CM | POA: Insufficient documentation

## 2018-03-27 DIAGNOSIS — C8373 Burkitt lymphoma, intra-abdominal lymph nodes: Secondary | ICD-10-CM | POA: Diagnosis not present

## 2018-03-27 DIAGNOSIS — Z79899 Other long term (current) drug therapy: Secondary | ICD-10-CM

## 2018-03-27 DIAGNOSIS — T451X5A Adverse effect of antineoplastic and immunosuppressive drugs, initial encounter: Secondary | ICD-10-CM

## 2018-03-27 DIAGNOSIS — R1909 Other intra-abdominal and pelvic swelling, mass and lump: Secondary | ICD-10-CM | POA: Diagnosis not present

## 2018-03-27 DIAGNOSIS — D500 Iron deficiency anemia secondary to blood loss (chronic): Secondary | ICD-10-CM | POA: Diagnosis not present

## 2018-03-27 DIAGNOSIS — E119 Type 2 diabetes mellitus without complications: Secondary | ICD-10-CM | POA: Diagnosis not present

## 2018-03-27 DIAGNOSIS — I2699 Other pulmonary embolism without acute cor pulmonale: Secondary | ICD-10-CM

## 2018-03-27 DIAGNOSIS — Z95828 Presence of other vascular implants and grafts: Secondary | ICD-10-CM

## 2018-03-27 DIAGNOSIS — C8213 Follicular lymphoma grade II, intra-abdominal lymph nodes: Secondary | ICD-10-CM | POA: Diagnosis not present

## 2018-03-27 LAB — URIC ACID: Uric Acid, Serum: 2.9 mg/dL — ABNORMAL LOW (ref 3.7–8.6)

## 2018-03-27 LAB — CBC WITH DIFFERENTIAL (CANCER CENTER ONLY)
BASOS PCT: 0 %
Basophils Absolute: 0 10*3/uL (ref 0.0–0.1)
EOS PCT: 12 %
Eosinophils Absolute: 0 10*3/uL (ref 0.0–0.5)
HEMATOCRIT: 21.6 % — AB (ref 38.4–49.9)
HEMOGLOBIN: 7 g/dL — AB (ref 13.0–17.1)
LYMPHS PCT: 51 %
Lymphs Abs: 0.3 10*3/uL — ABNORMAL LOW (ref 0.9–3.3)
MCH: 27.6 pg (ref 27.2–33.4)
MCHC: 32.4 g/dL (ref 32.0–36.0)
MCV: 85 fL (ref 79.3–98.0)
MONOS PCT: 2 %
Monocytes Absolute: 0 10*3/uL — ABNORMAL LOW (ref 0.1–0.9)
Neutro Abs: 0.1 10*3/uL — CL (ref 1.5–6.5)
Neutrophils Relative %: 35 %
PLATELETS: 46 10*3/uL — AB (ref 140–400)
RBC: 2.54 MIL/uL — ABNORMAL LOW (ref 4.20–5.82)
RDW: 16.7 % — AB (ref 11.0–14.6)
WBC Count: 0.4 10*3/uL — CL (ref 4.0–10.3)

## 2018-03-27 LAB — FERRITIN: FERRITIN: 879 ng/mL — AB (ref 24–336)

## 2018-03-27 LAB — CMP (CANCER CENTER ONLY)
ALBUMIN: 2.8 g/dL — AB (ref 3.5–5.0)
ALK PHOS: 41 U/L (ref 38–126)
ALT: 20 U/L (ref 0–44)
ANION GAP: 5 (ref 5–15)
AST: 12 U/L — ABNORMAL LOW (ref 15–41)
BUN: 11 mg/dL (ref 8–23)
CALCIUM: 8.9 mg/dL (ref 8.9–10.3)
CHLORIDE: 104 mmol/L (ref 98–111)
CO2: 25 mmol/L (ref 22–32)
Creatinine: 0.65 mg/dL (ref 0.61–1.24)
GFR, Est AFR Am: >60 mL/min (ref >60)
GFR, Estimated: >60 mL/min (ref >60)
Glucose, Bld: 82 mg/dL (ref 70–99)
POTASSIUM: 3.5 mmol/L (ref 3.5–5.1)
SODIUM: 134 mmol/L — AB (ref 135–145)
Total Bilirubin: 0.3 mg/dL (ref 0.3–1.2)
Total Protein: 5.7 g/dL — ABNORMAL LOW (ref 6.5–8.1)

## 2018-03-27 LAB — IRON AND TIBC
Iron: 57 ug/dL (ref 42–163)
SATURATION RATIOS: 19 % — AB (ref 42–163)
TIBC: 305 ug/dL (ref 202–409)
UIBC: 248 ug/dL

## 2018-03-27 LAB — SAMPLE TO BLOOD BANK

## 2018-03-27 LAB — ABO/RH: ABO/RH(D): O POS

## 2018-03-27 LAB — LACTATE DEHYDROGENASE: LDH: 199 U/L — ABNORMAL HIGH (ref 98–192)

## 2018-03-27 MED ORDER — SODIUM CHLORIDE 0.9% FLUSH
10.0000 mL | Freq: Once | INTRAVENOUS | Status: AC
  Administered 2018-03-27: 10 mL
  Filled 2018-03-27: qty 10

## 2018-03-27 MED ORDER — HEPARIN SOD (PORK) LOCK FLUSH 100 UNIT/ML IV SOLN
500.0000 [IU] | Freq: Once | INTRAVENOUS | Status: DC
  Filled 2018-03-27: qty 5

## 2018-03-27 MED ORDER — PEGFILGRASTIM INJECTION 6 MG/0.6ML ~~LOC~~
PREFILLED_SYRINGE | SUBCUTANEOUS | Status: AC
  Filled 2018-03-27: qty 0.6

## 2018-03-27 MED ORDER — PEGFILGRASTIM INJECTION 6 MG/0.6ML ~~LOC~~
6.0000 mg | PREFILLED_SYRINGE | Freq: Once | SUBCUTANEOUS | Status: DC
  Administered 2018-04-05: 6 mg via SUBCUTANEOUS

## 2018-03-27 MED ORDER — LIDOCAINE-PRILOCAINE 2.5-2.5 % EX CREA: 1.0000 "application " | TOPICAL_CREAM | CUTANEOUS | 2 refills | Status: DC | PRN

## 2018-03-27 NOTE — Progress Notes (Signed)
Instructed patient and his wife to go to sickle cell tomorrow at 0800 and do not remove blood bracelet. They both verbalized understanding. Given directions.

## 2018-03-27 NOTE — Progress Notes (Signed)
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  Based on the IHC studies, the differential diagnosis includes diffuse large B-cell lymphoma, Burkitt lymphoma as well as but can like lymphoma.       03/15/2018 Initial Diagnosis    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[place])      03/15/2018 Procedure    Colonoscopy showed a large tumor in the proximal ascending colon, likely from the direct invasion of his abdominal lymphoma.  Biopsy confirmed high-grade B-cell lymphoma.      03/17/2018 -  Chemotherapy    R-EPOCH every 3 weeks      03/17/2018 Procedure    Bone marrow biopsy was negative for lymphoma involvement.      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 He received intrathecal methotrexate.        HISTORY OF PRESENTING ILLNESS on 03/11/18 by Dr. Lindi Adie "[name] 62 y.o. male is here because of recent diagnosis of right leg DVT and bilateral PEs.  He originally presented with a swollen right leg and went to see his primary care physician who suspected DVT and performed an ultrasound which confirmed the DVT in his right leg.  He was  encouraged to go to the emergency room.  By way of ultrasound, a large abdominal mass was identified.  He was sent evaluated in the emergency room and had a CT of his abdomen along with CT of his chest.  CT chest revealed diffuse bilateral PEs.  His abdominal CT showed an extremely large tumor 18.7 x 18.5 x 17.8 cm that is encasing his entire lower abdomen and small bowel loops.  In addition there was also a masslike thickening at the ureterovesical junction measuring 5 cm. Surprisingly he has no abdominal symptoms whatsoever.  He has been eating well.  He has no abdominal pain and no problems going for bowel movements.  He denies any fevers chills night sweats or weight loss. He works in Program[employer], applications. I reviewed her records extensively and collaborated the history with the patient."  CURRENT THERAPY: R-EPOCH every 3 weeks with neulasta support, started on 03/17/2018  INTERVAL HISTORY:  [name] is here for his fist outpatient visit since his hospitalization.  I first met him in the hospital when he was diagnosed with diffuse large B-cell lymphoma, first cycle chemotherapy was given in the hospital.  His hospital course was complicated with GI bleeding from the lymphoma in the colon, required a total of 7 units of blood transfusion.  Tolerated chemotherapy very well.  He presents to the clinic today accompanied by fmaily member.  He  was discharged home 2 days ago.  He notes he did well at home. He notes he had formed stool with less blood mixed in stool. He continues to take Lovenox injection. He notes his scrotum swelling has reduces a lot. He notes he walks with walker at home.     REVIEW OF SYSTEMS:   Constitutional: Denies fevers, chills or abnormal weight loss  Eyes: Denies blurriness of vision Ears, nose, mouth, throat, and face: Denies mucositis or sore throat Respiratory: Denies cough, dyspnea or wheezes Cardiovascular: Denies palpitation, chest discomfort (+) B/l lower extremity  swelling L>R, (+) scrotum swelling much improved Gastrointestinal:  Denies nausea, heartburn (+) blood in stool  Skin: Denies abnormal skin rashes Lymphatics: Denies new lymphadenopathy or easy bruising Neurological:Denies numbness, tingling or new weaknesses Behavioral/Psych: Mood is stable, no new changes  All other systems were reviewed with the patient and are negative.  MEDICAL HISTORY:  Past Medical History:  Diagnosis Date  . ALLERGIC RHINITIS   . Diabetes mellitus   . Hyperlipidemia     SURGICAL HISTORY: Past Surgical History:  Procedure Laterality Date  . BIOPSY  03/15/2018   Procedure: BIOPSY;  Surgeon: Milus Banister, MD;  Location: [facility];  Service: Endoscopy;;  . CLEFT PALATE REPAIR    . COLONOSCOPY N/A 03/15/2018   Procedure: COLONOSCOPY;  Surgeon: Milus Banister, MD;  Location: [facility];  Service: Endoscopy;  Laterality: N/A;  . deviated septum repair     slight improvement  . ESOPHAGOGASTRODUODENOSCOPY N/A 03/15/2018   Procedure: ESOPHAGOGASTRODUODENOSCOPY (EGD);  Surgeon: Milus Banister, MD;  Location: [name];  Service: Endoscopy;  Laterality: N/A;  . IR IMAGING GUIDED PORT INSERTION  03/17/2018  . TONSILLECTOMY      I have reviewed the social history and family history with the patient and they are unchanged from previous note.  ALLERGIES:  is allergic to ciprofloxacin.  MEDICATIONS:  Current Outpatient Medications  Medication Sig Dispense Refill  . acetaminophen (TYLENOL) 325 MG tablet Take 2 tablets (650 mg total) by mouth every 6 (six) hours as needed for mild pain (or Fever >/= 101).    [name] allopurinol (ZYLOPRIM) 300 MG tablet Take 1 tablet (300 mg total) by mouth daily. 30 tablet 0  . Choline Fenofibrate 135 MG capsule TAKE 1 CAPSULE DAILY 90 capsule 1  . docusate sodium (COLACE) 100 MG capsule Take 1 capsule (100 mg total) by mouth 2 (two) times daily. 10 capsule 0  . enoxaparin (LOVENOX) 150 MG/ML injection Inject 1 mL (150 mg total)  into the skin daily. 30 Syringe 0  . glimepiride (AMARYL) 2 MG tablet Take 1 tablet (2 mg total) by mouth daily before breakfast. (Patient taking differently: Take 1 mg by mouth daily before breakfast. ) 90 tablet 3  . glucose blood (FREESTYLE TEST STRIPS) test strip 1 each by Other route daily. Use as instructed     . JANUMET 50-1000 MG tablet TAKE 1 TABLET TWICE DAILY  WITH MEALS 180 tablet 3  . Lancets (FREESTYLE) lancets 1 each by Other route as directed. Use as instructed     . lidocaine-prilocaine (EMLA) cream Apply 1 application topically as needed. 30 g 2  . Multiple Vitamin (MULTIVITAMIN) capsule Take 1 capsule by mouth daily.      . ondansetron (ZOFRAN) 8 MG tablet Take 1 tablet (8 mg total) by mouth 2 (two) times daily as needed (Nausea or vomiting). 30 tablet 1  . polyethylene glycol (MIRALAX / GLYCOLAX) packet Take 17 g by mouth  daily as needed for mild constipation. 14 each 0  . prochlorperazine (COMPAZINE) 10 MG tablet Take 1 tablet (10 mg total) by mouth every 6 (six) hours as needed (Nausea or vomiting). 30 tablet 1   No current facility-administered medications for this visit.     PHYSICAL EXAMINATION: ECOG PERFORMANCE STATUS: 3 - Symptomatic, >50% confined to bed  Vitals:   03/27/18 1206  BP: 114/64  Pulse: 100  Resp: 18  Temp: 98.9 F (37.2 C)  SpO2: 100%   Filed Weights   03/27/18 1206  Weight: 247 lb 4.8 oz (112.2 kg)    GENERAL:alert, no distress and comfortable SKIN: skin color, texture, turgor are normal, no rashes or significant lesions EYES: normal, Conjunctiva are pink and non-injected, sclera clear OROPHARYNX:no exudate, no erythema and lips, buccal mucosa, and tongue normal  NECK: supple, thyroid normal size, non-tender, without nodularity LYMPH:  no palpable lymphadenopathy in the cervical, axillary or inguinal LUNGS: clear to auscultation and percussion with normal breathing effort HEART: regular rate & rhythm and no murmurs ABDOMEN:abdomen soft,  non-tender and normal bowel sounds Musculoskeletal:no cyanosis of digits and no clubbing, (+) bilateral lower extremity edema, R>>L NEURO: alert & oriented x 3 with fluent speech, no focal motor/sensory deficits  LABORATORY DATA:  I have reviewed the data as listed CBC Latest Ref Rng & Units 03/27/2018 03/25/2018 03/24/2018  WBC 4.0 - 10.3 K/uL 0.4(LL) 1.9(L) -  Hemoglobin 13.0 - 17.1 g/dL 7.0(LL) 8.3(L) 8.7(L)  Hematocrit 38.4 - 49.9 % 21.6(L) 24.8(L) 26.2(L)  Platelets 140 - 400 K/uL 46(L) 114(L) -     CMP Latest Ref Rng & Units 03/27/2018 03/21/2018 03/19/2018  Glucose 70 - 99 mg/dL 82 156(H) 187(H)  BUN 8 - 23 mg/dL 11 28(H) 36(H)  Creatinine 0.61 - 1.24 mg/dL 0.65 0.76 0.98  Sodium 135 - 145 mmol/L 134(L) 138 138  Potassium 3.5 - 5.1 mmol/L 3.5 4.5 4.8  Chloride 98 - 111 mmol/L 104 108 108  CO2 22 - 32 mmol/L _0 Calcium 8.9 - 10.3 mg/dL 8.9 8.4(L) 8.1(L)  Total Protein 6.5 - 8.1 g/dL 5.7(L) 5.3(L) -  Total Bilirubin 0.3 - 1.2 mg/dL 0.3 0.6 -  Alkaline Phos 38 - 126 U/L 41 36(L) -  AST 15 - 41 U/L 12(L) 17 -  ALT 0 - 44 U/L 20 26 -   PATHOLOGY REPORT  Diagnosis 03/13/2018 Peritoneum, biopsy, RLQ cavity - HIGH GRADE B-CELL LYMPHOMA. - SEE COMMENT. Microscopic Comment Sections show needle core biopsy fragments of soft tissue displaying a dense and relatively monomorphic infiltrate of atypical medium to large lymphoid appearing cells displaying high nuclear cytoplasmic ratio, partially clumped to vesicular chromatin and single to multiple nucleoli associated with brisk mitosis, apoptosis and a starry sky pattern. The appearance is primarily diffuse with lack of atypical follicles or nodules. To further evaluate this process, flow cytometric analysis was performed ([id number]) and shows a monoclonal, kappa restricted B-cell population expressing pan B-cell antigens including CD20 associated with CD10 expression. No CD5 expression is identified. In addition, immunohistochemical  stains were performed including BCL-2, BCL-6, CD10, CD20, CD3, CD30, CD34, LCA, CD5, CD79a, and TdT with appropriate controls. The lymphoid cells are diffusely positive for LCA and are predominantly composed of B cells as seen with CD20 and CD79a associated with CD10 and BCL-6 expression. There is also patchy, weak BCL-2 expression. No significant staining is seen with CD30, CD34 or TdT and there is no apparent co-expression of CD5 in B-cell areas. There is a minor  T-cell population in the background as primarily highlighted with CD3. The overall findings are most consistent with high grade B-cell lymphoma. Based on the overall findings, the differential diagnosis includes diffuse large cell lymphoma, Burkitt lymphoma as well as Burkitt-like lymphoma. To evaluate for the presence of double or triple hit lymphoma, FISH studies will be performed on paraffin embedded tissue and the results will be reported separately.    RADIOGRAPHIC STUDIES: I have personally reviewed the radiological images as listed and agreed with the findings in the report. No results found.   ASSESSMENT & PLAN:  CONROY GORACKE is a 62 y.o. male with past medical history of diabetes and hyperlipidemia, presented with acute right lower extremity DVT, and further work-up found submassive PE and a large abdominal mass.   1. 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  A large abdominal mass was found which directly invading his ascending colon, causing significant GI bleeding when he was on blood thinner.  Biopsy of the abdominal mass in the right colon mass both showed high-grade B-cell lymphoma.  He is a CT scan otherwise negative for other lymphadenopathy, bone marrow biopsy was negative. -I checked with pathology lab, the [facility] test results are still pending.  -I previously reviewed his labs, pathology and  scans in great detail. I previously discussed his bone marrow biopsy was negative for lymphoma, so his final lymphoma stage is II. His CSF cytology was negative for malignant cells. -He started the first cycle R-EPOCH on 03/17/18 and tolerated first cycle well.  -Labs reveiwed, significant pancytopenia, will give neulasta injection today and blood transfusion tomorrow  -f/u later this week   2. Right ascending colon mass with significant GI bleeding -secondary to abdominal lymphoma direct invasion  -He had significant GI bleeding from the tumor, when he was on heparin drip, and anticoagulation was held for several days -After first cycle chemotherapy, his abdominal wall mass has shrunk significantly, he was restarted on heparin drip, and switch to Lovenox 164m daily on 03/25/2018  -He has trace blood in his stool in the past few days, due to his significant thrombocytopenia, I will hold his Lovenox for now -We previously discussed if he has recurrence of significant GI bleeding or can not get full dose anticoagulation, he will need IVC filter placed, he is agreeable    3. Submassive PE and right lower extrmitity DVT, Lower extrimity swelling  -Treated with heprin drip in hospital  -I previously discussed the 7/1/119 CT venogram abdomen and pelvis which showed significant shrinkage of the large infiltrative mass in the abdomen and the pelvis, right hydronephrosis has resolved, no significant thrombus in the IVC, he does have thrombus in the right femoral vein -He was discharged with lovenox 1559mdaily (80% of full dose based on his weight, but also he is morbid obese, we some time dose reduce for pt with morbid obesity). -His plt are low and his GI bleeding restarted. I advised him to hol dhis levonox injections for now.    4. Anemia of iron deficient anemia and GI bleeding, chemo induced anemia  -Anemia likely secondary to GI bleeding, and iron deficiency -He received a total of 5u RBC from  03/16/18-03/24/2018 in the hospital  -Hg at 7.0 today, I will give blood transfusion 2u tomorrow     5. Neutropenia and thrombocytopenia  -secondary to chemotherapy   -WBC at 0.4 and  ANC at 0.1 today  -I discussed he is at risk for infetion. I advised him to avoid large crowds and those who are sick. -I provided him with masks today and recommend he rinse with warm salt water and baking soda and non-alcoholic mouth wash.  -We will stop his Lovenox due to risk of bleeding from thrombocytopenia   6. Type 2 diabetes, Obesity  -On Janumet BID -Monitor blood glucose closely, we discussed chemo/steroids induced hyperglycemia.   PLAN:  -Lab reviewed, he is pancytopenic, will proceed Neulasta injection today, he knows to take Claritin daily for the next 5 days -hold lovenox for now, due to risk of bleeding from thrombocytopenia -2u RBC, irradiated, at the sickle cell clinic tomorrow morning -Lab, and blood transfusion in 2 and 5 days -Follow-up in 5 days -I will restart Lovenox if platelet count >75K     Orders Placed This Encounter  Procedures  . Type and screen  . Prepare RBC    Standing Status:   Standing    Number of Occurrences:   1    Order Specific Question:   # of Units    Answer:   2 units    Order Specific Question:   Transfusion Indications    Answer:   Symptomatic Anemia    Order Specific Question:   Special Requirements    Answer:   Irradiated    Order Specific Question:   If emergent release call blood bank    Answer:   [facility] [phone]  . ABO/Rh   All questions were answered. The patient knows to call the clinic with any problems, questions or concerns. No barriers to learning was detected. I spent 25 minutes counseling the patient face to face. The total time spent in the appointment was 40 minutes and more than 50% was on counseling and review of test results     Truitt Merle, MD 03/27/2018 5:05 PM   I, Joslyn Devon, am acting as scribe for Truitt Merle, MD.    I have reviewed the above documentation for accuracy and completeness, and I agree with the above.

## 2018-03-27 NOTE — Progress Notes (Signed)
Give Neulasta per Dr. Burr Medico desk nurse Hassan Rowan) Carlene Coria LPN

## 2018-03-27 NOTE — Telephone Encounter (Signed)
Printed avs and calender of upcoming appointment. Per 7/15 los. Dr. Burr Medico requested that labs be drawn  At 7:45 from the patient arm/ and platelets began  at 8:30. Patient has a conflict with his appointment on the 17th. Will call feng or RN.

## 2018-03-27 NOTE — Addendum Note (Signed)
Addended by: Carlene Coria L on: 03/27/2018 02:11 PM   Modules accepted: Orders

## 2018-03-28 ENCOUNTER — Ambulatory Visit (HOSPITAL_COMMUNITY)
Admission: RE | Admit: 2018-03-28 | Discharge: 2018-03-28 | Disposition: A | Discharge status: Home / Self Care | Payer: BLUE CROSS/BLUE SHIELD | Source: Ambulatory Visit | Attending: Hematology | Admitting: Hematology

## 2018-03-28 ENCOUNTER — Telehealth: Payer: Self-pay

## 2018-03-28 ENCOUNTER — Emergency Department (HOSPITAL_COMMUNITY)
Admission: EM | Admit: 2018-03-28 | Discharge: 2018-03-28 | Discharge status: Against Medical Advice | Payer: BLUE CROSS/BLUE SHIELD

## 2018-03-28 ENCOUNTER — Encounter (HOSPITAL_COMMUNITY): Payer: Self-pay | Admitting: Hematology

## 2018-03-28 DIAGNOSIS — R1903 Right lower quadrant abdominal swelling, mass and lump: Secondary | ICD-10-CM | POA: Diagnosis not present

## 2018-03-28 DIAGNOSIS — K922 Gastrointestinal hemorrhage, unspecified: Secondary | ICD-10-CM | POA: Diagnosis not present

## 2018-03-28 DIAGNOSIS — D509 Iron deficiency anemia, unspecified: Secondary | ICD-10-CM | POA: Diagnosis not present

## 2018-03-28 DIAGNOSIS — E785 Hyperlipidemia, unspecified: Secondary | ICD-10-CM | POA: Diagnosis not present

## 2018-03-28 DIAGNOSIS — C8510 Unspecified B-cell lymphoma, unspecified site: Secondary | ICD-10-CM

## 2018-03-28 DIAGNOSIS — D500 Iron deficiency anemia secondary to blood loss (chronic): Secondary | ICD-10-CM | POA: Diagnosis not present

## 2018-03-28 DIAGNOSIS — C8373 Burkitt lymphoma, intra-abdominal lymph nodes: Secondary | ICD-10-CM | POA: Diagnosis not present

## 2018-03-28 DIAGNOSIS — D696 Thrombocytopenia, unspecified: Secondary | ICD-10-CM | POA: Diagnosis not present

## 2018-03-28 DIAGNOSIS — I2699 Other pulmonary embolism without acute cor pulmonale: Secondary | ICD-10-CM | POA: Diagnosis not present

## 2018-03-28 DIAGNOSIS — D709 Neutropenia, unspecified: Secondary | ICD-10-CM | POA: Diagnosis not present

## 2018-03-28 DIAGNOSIS — I82491 Acute embolism and thrombosis of other specified deep vein of right lower extremity: Secondary | ICD-10-CM | POA: Diagnosis not present

## 2018-03-28 DIAGNOSIS — E119 Type 2 diabetes mellitus without complications: Secondary | ICD-10-CM | POA: Diagnosis not present

## 2018-03-28 DIAGNOSIS — Z79899 Other long term (current) drug therapy: Secondary | ICD-10-CM | POA: Diagnosis not present

## 2018-03-28 DIAGNOSIS — T451X5A Adverse effect of antineoplastic and immunosuppressive drugs, initial encounter: Secondary | ICD-10-CM | POA: Diagnosis not present

## 2018-03-28 LAB — PREPARE RBC (CROSSMATCH)

## 2018-03-28 MED ORDER — SODIUM CHLORIDE 0.9% FLUSH
10.0000 mL | INTRAVENOUS | Status: AC | PRN
  Administered 2018-03-28: 10 mL

## 2018-03-28 MED ORDER — FUROSEMIDE 10 MG/ML IJ SOLN
20.0000 mg | Freq: Once | INTRAMUSCULAR | Status: AC
  Administered 2018-03-28: 20 mg via INTRAVENOUS
  Filled 2018-03-28: qty 2

## 2018-03-28 MED ORDER — HEPARIN SOD (PORK) LOCK FLUSH 100 UNIT/ML IV SOLN
500.0000 [IU] | Freq: Every day | INTRAVENOUS | Status: AC | PRN
  Administered 2018-03-28: 500 [IU]
  Filled 2018-03-28: qty 5

## 2018-03-28 MED ORDER — SODIUM CHLORIDE 0.9 % IV SOLN
250.0000 mL | Freq: Once | INTRAVENOUS | Status: AC
  Administered 2018-03-28: 250 mL via INTRAVENOUS

## 2018-03-28 MED ORDER — ACETAMINOPHEN 325 MG PO TABS
650.0000 mg | ORAL_TABLET | Freq: Once | ORAL | Status: AC
  Administered 2018-03-28: 650 mg via ORAL
  Filled 2018-03-28: qty 2

## 2018-03-28 NOTE — ED Triage Notes (Signed)
Pt decided to leave since his temperature was normal here, He has an appt in the morning for a blood transfusion at the [facility]

## 2018-03-28 NOTE — Progress Notes (Signed)
PATIENT CARE CENTER NOTE  Diagnosis: Iron Deficiency Anemia    Provider: Dr. Truitt Merle   Procedure: 2 unit PRBC's   Note: Patient received blood transfusion of 2 units. Before start of transfusion, patient's temperature noted to be 100.4. Called Dr. Ernestina Penna office and received order for Tylenol 650 mg. Patient tolerated transfusion well. Vital signs stable. Temperature 99.5 at discharge. Discharge instructions given. Patient alert, oriented and ambulatory to wheelchair at discharge.

## 2018-03-28 NOTE — Discharge Instructions (Signed)

## 2018-03-28 NOTE — Telephone Encounter (Signed)
Chedc out per 5/16 los

## 2018-03-29 ENCOUNTER — Inpatient Hospital Stay: Payer: BLUE CROSS/BLUE SHIELD

## 2018-03-29 ENCOUNTER — Other Ambulatory Visit: Payer: BLUE CROSS/BLUE SHIELD

## 2018-03-29 DIAGNOSIS — I2699 Other pulmonary embolism without acute cor pulmonale: Secondary | ICD-10-CM | POA: Diagnosis not present

## 2018-03-29 DIAGNOSIS — Z79899 Other long term (current) drug therapy: Secondary | ICD-10-CM | POA: Diagnosis not present

## 2018-03-29 DIAGNOSIS — E119 Type 2 diabetes mellitus without complications: Secondary | ICD-10-CM | POA: Diagnosis not present

## 2018-03-29 DIAGNOSIS — T451X5A Adverse effect of antineoplastic and immunosuppressive drugs, initial encounter: Secondary | ICD-10-CM | POA: Diagnosis not present

## 2018-03-29 DIAGNOSIS — R1903 Right lower quadrant abdominal swelling, mass and lump: Secondary | ICD-10-CM | POA: Diagnosis not present

## 2018-03-29 DIAGNOSIS — E785 Hyperlipidemia, unspecified: Secondary | ICD-10-CM | POA: Diagnosis not present

## 2018-03-29 DIAGNOSIS — C8510 Unspecified B-cell lymphoma, unspecified site: Secondary | ICD-10-CM

## 2018-03-29 DIAGNOSIS — I82491 Acute embolism and thrombosis of other specified deep vein of right lower extremity: Secondary | ICD-10-CM | POA: Diagnosis not present

## 2018-03-29 DIAGNOSIS — C8373 Burkitt lymphoma, intra-abdominal lymph nodes: Secondary | ICD-10-CM | POA: Diagnosis not present

## 2018-03-29 DIAGNOSIS — K922 Gastrointestinal hemorrhage, unspecified: Secondary | ICD-10-CM | POA: Diagnosis not present

## 2018-03-29 DIAGNOSIS — D709 Neutropenia, unspecified: Secondary | ICD-10-CM | POA: Diagnosis not present

## 2018-03-29 DIAGNOSIS — D696 Thrombocytopenia, unspecified: Secondary | ICD-10-CM | POA: Diagnosis not present

## 2018-03-29 DIAGNOSIS — D500 Iron deficiency anemia secondary to blood loss (chronic): Secondary | ICD-10-CM | POA: Diagnosis not present

## 2018-03-29 LAB — CMP (CANCER CENTER ONLY)
ALK PHOS: 47 U/L (ref 38–126)
ALT: 30 U/L (ref 0–44)
AST: 20 U/L (ref 15–41)
Albumin: 2.8 g/dL — ABNORMAL LOW (ref 3.5–5.0)
Anion gap: 9 (ref 5–15)
BILIRUBIN TOTAL: 0.7 mg/dL (ref 0.3–1.2)
BUN: 9 mg/dL (ref 8–23)
CALCIUM: 9.3 mg/dL (ref 8.9–10.3)
CO2: 23 mmol/L (ref 22–32)
CREATININE: 0.83 mg/dL (ref 0.61–1.24)
Chloride: 101 mmol/L (ref 98–111)
Glucose, Bld: 185 mg/dL — ABNORMAL HIGH (ref 70–99)
Potassium: 3.4 mmol/L — ABNORMAL LOW (ref 3.5–5.1)
Sodium: 133 mmol/L — ABNORMAL LOW (ref 135–145)
Total Protein: 6.3 g/dL — ABNORMAL LOW (ref 6.5–8.1)

## 2018-03-29 LAB — CBC WITH DIFFERENTIAL (CANCER CENTER ONLY)
Basophils Absolute: 0 10*3/uL (ref 0.0–0.1)
Basophils Relative: 3 %
EOS PCT: 4 %
Eosinophils Absolute: 0 10*3/uL (ref 0.0–0.5)
HEMATOCRIT: 26.7 % — AB (ref 38.4–49.9)
HEMOGLOBIN: 9.1 g/dL — AB (ref 13.0–17.1)
LYMPHS ABS: 0.1 10*3/uL — AB (ref 0.9–3.3)
LYMPHS PCT: 51 %
MCH: 28.8 pg (ref 27.2–33.4)
MCHC: 34 g/dL (ref 32.0–36.0)
MCV: 84.6 fL (ref 79.3–98.0)
Monocytes Absolute: 0.1 10*3/uL (ref 0.1–0.9)
Monocytes Relative: 35 %
Neutro Abs: 0 10*3/uL — CL (ref 1.5–6.5)
Neutrophils Relative %: 7 %
Platelet Count: 75 10*3/uL — ABNORMAL LOW (ref 140–400)
RBC: 3.15 MIL/uL — AB (ref 4.20–5.82)
RDW: 17.8 % — ABNORMAL HIGH (ref 11.0–14.6)
WBC: 0.3 10*3/uL — AB (ref 4.0–10.3)

## 2018-03-29 LAB — BPAM RBC
Blood Product Expiration Date: 201908122359
Blood Product Expiration Date: 201908122359
ISSUE DATE / TIME: 201907160832
ISSUE DATE / TIME: 201907160832
Unit Type and Rh: 5100
Unit Type and Rh: 5100

## 2018-03-29 LAB — TYPE AND SCREEN
ABO/RH(D): O POS
ANTIBODY SCREEN: NEGATIVE
UNIT DIVISION: 0
UNIT DIVISION: 0

## 2018-03-29 LAB — GLUCOSE, CAPILLARY: GLUCOSE-CAPILLARY: 158 mg/dL — AB (ref 70–99)

## 2018-03-29 NOTE — Progress Notes (Unsigned)
Cancel blood transfusion for today per Dr. Burr Medico, notified infusion.

## 2018-03-30 ENCOUNTER — Other Ambulatory Visit: Payer: BLUE CROSS/BLUE SHIELD

## 2018-03-30 ENCOUNTER — Telehealth: Payer: Self-pay

## 2018-03-30 ENCOUNTER — Telehealth: Payer: Self-pay | Admitting: Family Medicine

## 2018-03-30 ENCOUNTER — Ambulatory Visit (INDEPENDENT_AMBULATORY_CARE_PROVIDER_SITE_OTHER): Payer: BLUE CROSS/BLUE SHIELD | Admitting: Family Medicine

## 2018-03-30 ENCOUNTER — Other Ambulatory Visit: Payer: Self-pay | Admitting: Hematology

## 2018-03-30 ENCOUNTER — Encounter: Payer: Self-pay | Admitting: Family Medicine

## 2018-03-30 ENCOUNTER — Telehealth: Payer: Self-pay | Admitting: Hematology

## 2018-03-30 VITALS — BP 138/76 | HR 119 | Temp 100.0°F | Ht 69.0 in | Wt 237.4 lb

## 2018-03-30 DIAGNOSIS — E119 Type 2 diabetes mellitus without complications: Secondary | ICD-10-CM | POA: Diagnosis not present

## 2018-03-30 DIAGNOSIS — C8510 Unspecified B-cell lymphoma, unspecified site: Secondary | ICD-10-CM

## 2018-03-30 DIAGNOSIS — R000 Tachycardia, unspecified: Secondary | ICD-10-CM | POA: Diagnosis not present

## 2018-03-30 DIAGNOSIS — I2699 Other pulmonary embolism without acute cor pulmonale: Secondary | ICD-10-CM

## 2018-03-30 DIAGNOSIS — M6281 Muscle weakness (generalized): Secondary | ICD-10-CM | POA: Diagnosis not present

## 2018-03-30 MED ORDER — GLUCOSE BLOOD VI STRP: ORAL_STRIP | 4 refills | Status: DC

## 2018-03-30 NOTE — Progress Notes (Addendum)
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  Based on the IHC studies, the differential diagnosis includes diffuse large B-cell lymphoma, Burkitt lymphoma as well as but can like lymphoma.       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  Biopsy confirmed high-grade B-cell lymphoma.      03/17/2018 -  Chemotherapy    R-EPOCH every 3 weeks      03/17/2018 Procedure    Bone marrow biopsy was negative for lymphoma involvement.      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 He received intrathecal methotrexate.     CURRENT THERAPY: R-EPOCH every 3 weeks with neulasta support, started on 03/17/2018   INTERVAL HISTORY: Mark Frey returns for f/u today as scheduled. He completed RBC transfusion on 03/28/18 and received neulasta this week. He denies bone pain from injection. His energy level improved with blood transfusion, he has been more mobile and active this week. Appetite is decreasing, weight is down 10 lbs this week. Denies mucositis,  nausea, vomiting, or constipation. He had a "bout" of diarrhea for 2.5 days this week, 3 episodes per day after meal. He had a normal BM this morning. Denies blood in stool since 7/16. He restarted lovenox per instruction on 7/17. Leg and generalized swelling continues to improve. Denies cough, chest pain, hemoptysis, or dyspnea.   REVIEW OF SYSTEMS:   Constitutional: Denies fevers, chills (+) abnormal weight loss (+) decreased appetite  Eyes: Denies blurriness of vision Ears, nose, mouth, throat, and face: Denies mucositis or sore throat Respiratory: Denies cough, dyspnea or wheezes Cardiovascular: Denies palpitation, chest discomfort (+) R>L lower extremity swelling Gastrointestinal:  Denies nausea, vomiting, constipation, heartburn or change in bowel habits (+) no rectal bleeding since 7/17 (+) diarrhea over 2 days, 3 episodes per day  Skin: Denies abnormal skin rashes Lymphatics: Denies new lymphadenopathy or easy bruising Neurological:Denies numbness, tingling or new weaknesses Behavioral/Psych: Mood is stable, no new changes  All other systems were reviewed with the patient and are negative.  MEDICAL HISTORY:  Past Medical History:  Diagnosis Date  . ALLERGIC RHINITIS   . Diabetes mellitus   . Hyperlipidemia     SURGICAL HISTORY: Past Surgical History:  Procedure Laterality Date  . BIOPSY  03/15/2018   Procedure: BIOPSY;  Surgeon: Milus Banister, MD;  Location: [facility];  Service: Endoscopy;;  . CLEFT PALATE REPAIR    . COLONOSCOPY N/A 03/15/2018   Procedure: COLONOSCOPY;  Surgeon: Milus Banister, MD;  Location: [facility];  Service: Endoscopy;  Laterality: N/A;  . deviated septum repair     slight improvement  . ESOPHAGOGASTRODUODENOSCOPY N/A 03/15/2018   Procedure: ESOPHAGOGASTRODUODENOSCOPY (EGD);  Surgeon: Milus Banister, MD;  Location: [name];  Service: Endoscopy;  Laterality: N/A;  . IR IMAGING GUIDED PORT INSERTION  03/17/2018  . TONSILLECTOMY      I have  reviewed the social history and family history with the patient and they are unchanged from previous note.  ALLERGIES:  is allergic to ciprofloxacin.  MEDICATIONS:  Current Outpatient Medications  Medication Sig Dispense Refill  . acetaminophen (TYLENOL) 325 MG tablet Take 2 tablets (650 mg total) by mouth every 6 (six) hours as needed for mild pain (or Fever >/= 101).    [name] allopurinol (ZYLOPRIM) 300 MG tablet Take 1 tablet (300 mg total) by mouth daily. 30 tablet 0  . Choline Fenofibrate 135 MG capsule TAKE 1 CAPSULE DAILY 90 capsule 1  . docusate sodium (COLACE) 100 MG capsule Take 1 capsule (100 mg total) by mouth 2 (two) times daily. 10 capsule 0  . enoxaparin (LOVENOX) 150 MG/ML injection Inject 1 mL (150 mg total) into the skin daily. 30 Syringe 0  . glucose blood (FREESTYLE TEST STRIPS) test strip Use to check blood sugar daily 100 each 4  . JANUMET 50-1000 MG tablet TAKE 1 TABLET TWICE DAILY  WITH MEALS 180 tablet 3  . Lancets (FREESTYLE) lancets 1 each by Other route as directed. Use as instructed     . lidocaine-prilocaine (EMLA) cream Apply 1 application topically as needed. 30 g 2  . Multiple Vitamin (MULTIVITAMIN) capsule Take 1 capsule by mouth daily.      . ondansetron (ZOFRAN) 8 MG tablet Take 1 tablet (8 mg total) by mouth 2 (two) times daily as needed (Nausea or vomiting). 30 tablet 1  . polyethylene glycol (MIRALAX / GLYCOLAX) packet Take 17 g by mouth daily as needed for mild constipation. 14 each 0  . prochlorperazine (COMPAZINE) 10 MG tablet Take 1 tablet (10 mg total) by mouth every 6 (six) hours as needed (Nausea or vomiting). 30 tablet 1  . blood glucose meter kit and supplies Dispense based on patient and insurance preference. Use daily as directed. (E11.9). 1 each 0   No current facility-administered medications for this visit.     PHYSICAL EXAMINATION: ECOG PERFORMANCE STATUS: 2-3 Wt 235 lbs BP 109/63 HR 106 RR 18 T 98.5 oral O2 sat 99%  GENERAL:alert, no  distress and comfortable, presents in wheelchair  SKIN: no rashes or significant lesions EYES: normal, Conjunctiva are pink and non-injected, sclera clear OROPHARYNX:no thrush or ulcers LYMPH:  no palpable cervical or supraclavicular lymphadenopathy LUNGS: Distant breath sounds with normal breathing effort HEART: regular rate & rhythm.  Right greater than left lower extremity edema ABDOMEN:abdomen soft, non-tender and normal bowel sounds Musculoskeletal:no cyanosis of digits and no clubbing  NEURO: alert & oriented x 3 with fluent speech, no focal sensory deficits PAC without erythema  LABORATORY DATA:  I have reviewed the data as listed CBC Latest Ref Rng & Units 03/31/2018 03/29/2018 03/27/2018  WBC 4.0 - 10.3 K/uL 1.6(L) 0.3(LL) 0.4(LL)  Hemoglobin 13.0 - 17.1 g/dL 8.2(L) 9.1(L) 7.0(LL)  Hematocrit 38.4 - 49.9 % 25.2(L) 26.7(L) 21.6(L)  Platelets 140 - 400 K/uL 164 75(L) 46(L)     CMP Latest Ref Rng & Units 03/31/2018 03/29/2018 03/27/2018  Glucose 70 - 99 mg/dL 171(H) 185(H) 82  BUN 8 - 23 mg/dL 8 9 11   Creatinine 0.61 -  1.24 mg/dL 0.76 0.83 0.65  Sodium 135 - 145 mmol/L 132(L) 133(L) 134(L)  Potassium 3.5 - 5.1 mmol/L 3.1(L) 3.4(L) 3.5  Chloride 98 - 111 mmol/L 100 101 104  CO2 22 - 32 mmol/L 23 23 25   Calcium 8.9 - 10.3 mg/dL 9.2 9.3 8.9  Total Protein 6.5 - 8.1 g/dL 6.0(L) 6.3(L) 5.7(L)  Total Bilirubin 0.3 - 1.2 mg/dL 0.6 0.7 0.3  Alkaline Phos 38 - 126 U/L 51 47 41  AST 15 - 41 U/L 68(H) 20 12(L)  ALT 0 - 44 U/L 85(H) 30 20   PATHOLOGY REPORT  Diagnosis 03/13/2018 Peritoneum, biopsy, RLQ cavity - HIGH GRADE B-CELL LYMPHOMA. - SEE COMMENT. Microscopic Comment Sections show needle core biopsy fragments of soft tissue displaying a dense and relatively monomorphic infiltrate of atypical medium to large lymphoid appearing cells displaying high nuclear cytoplasmic ratio, partially clumped to vesicular chromatin and single to multiple nucleoli associated with brisk mitosis,  apoptosis and a starry sky pattern. The appearance is primarily diffuse with lack of atypical follicles or nodules. To further evaluate this process, flow cytometric analysis was performed ([id number]) and shows a monoclonal, kappa restricted B-cell population expressing pan B-cell antigens including CD20 associated with CD10 expression. No CD5 expression is identified. In addition, immunohistochemical stains were performed including BCL-2, BCL-6, CD10, CD20, CD3, CD30, CD34, LCA, CD5, CD79a, and TdT with appropriate controls. The lymphoid cells are diffusely positive for LCA and are predominantly composed of B cells as seen with CD20 and CD79a associated with CD10 and BCL-6 expression. There is also patchy, weak BCL-2 expression. No significant staining is seen with CD30, CD34 or TdT and there is no apparent co-expression of CD5 in B-cell areas. There is a minor T-cell population in the background as primarily highlighted with CD3. The overall findings are most consistent with high grade B-cell lymphoma. Based on the overall findings, the differential diagnosis includes diffuse large cell lymphoma, Burkitt lymphoma as well as Burkitt-like lymphoma. To evaluate for the presence of double or triple hit lymphoma, FISH studies will be performed on paraffin embedded tissue and the results will be reported separately.      RADIOGRAPHIC STUDIES: I have personally reviewed the radiological images as listed and agreed with the findings in the report. No results found.   ASSESSMENT & PLAN: Mark Frey is a 62 y.o. male with past medical history of diabetes and hyperlipidemia, presented with acute right lower extremity DVT, and further work-up found submassive PE and a large abdominal mass.   1. 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 Right ascending colon mass with significant GI bleeding 3. Submassive PE and RLE DVT, lower extremity swelling  4.  Anemia of iron deficiency and GI bleeding, and chemotherapy  5. Neutropenia and thrombocytopenia  6. DM, obesity   Mark Frey appears stable.  He completed 1 cycle R-EPOCH during hospitalization on 7/5.  He continues slow recovery from chemotherapy and hospitalization. We reviewed symptom management for diarrhea with imodium. he is afebrile. Status post Neulasta on 03/27/2018 and RBC transfusion x2 on 03/28/2018.  ANC improved to 0.7.  Hgb dropped slightly to 8.2 today.  No GI bleeding in 2 days.  He has restarted Lovenox given platelets in normal range.  Will support today with 2 units RBCs; he continues to require frequent blood transfusion.  Continue to check labs frequently, next lab on 7/22 and again on 7/25 with follow-up, and possible transfusion.  The patient was seen  with Dr. Burr Medico who recommended this plan and discussed upcoming cycle 2 chemotherapy, with plan to be done outpatient.  Plan to start week of 7/29, with IR IT chemo on 7/31 or 8/1. The patient and family are in agreement.   PLAN -Labs reviewed, RBC transfusion today for Hgb 8.2 -Continue lovenox, call [facility] with recurrent bleeding  -Begin nutrition supplement with glucerna -Placed urgent dietician referral  -R-EPOCH to start week of 7/29, IR IT chemo 7/31 or 8/1  Orders Placed This Encounter  Procedures  . Draw extra clot specimen    Standing Status:   Standing    Number of Occurrences:   100    Standing Expiration Date:   04/01/2019  . Ambulatory referral to Nutrition and Diabetic E    Referral Priority:   Urgent    Referral Type:   Consultation    Referral Reason:   Specialty Services Required    Number of Visits Requested:   1  . Practitioner attestation of consent    I, the ordering practitioner, attest that I have discussed with the patient the benefits, risks, side effects, alternatives, likelihood of achieving goals and potential problems during recovery for the procedure listed.    Standing Status:   Future     Number of Occurrences:   1    Standing Expiration Date:   03/31/2019    Order Specific Question:   Procedure    Answer:   Blood Product(s)  . Complete patient signature process for consent form    Standing Status:   Future    Number of Occurrences:   1    Standing Expiration Date:   03/31/2019  . Care order/instruction    Transfuse Parameters    Standing Status:   Future    Number of Occurrences:   1    Standing Expiration Date:   03/31/2019  . Prepare RBC    Standing Status:   Standing    Number of Occurrences:   1    Order Specific Question:   # of Units    Answer:   2 units    Order Specific Question:   Transfusion Indications    Answer:   Symptomatic Anemia    Order Specific Question:   If emergent release call blood bank    Answer:   Not emergent release   All questions were answered. The patient knows to call the clinic with any problems, questions or concerns. No barriers to learning was detected. I spent 20 minutes counseling the patient face to face. The total time spent in the appointment was 25 minutes and more than 50% was on counseling and review of test results     Alla Feeling, NP 03/31/18   Addendum  I have seen the patient, examined him. I agree with the assessment and and plan and have edited the notes.   Mr Frey is clinically doing well, had one normal BM this morning, no GI bleeding lately.  He restarted Lovenox injection yesterday, tolerating well.  He overall feels better with more energy, appetite still low, lost quite a bit of weight.  He agrees to take Glucerna supplement.  Lab reviewed, hemoglobin slightly dropped to 8.2, given the upcoming weekend, will give blood transfusion 2 units today.  Neutropenia and thrombocytopenia has much improved.  We will continue close monitoring with lab twice and potential blood transfusion support for next week.  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  BCL 6 rearrangement was negative.  I spoke with pathologist Dr. Tresa Moore today, she will order further FISH tests to rule out or confirm Burkitt lymphoma.   I will see him back next week and plan to start second cycle chemo the week of 7/29.   Truitt Merle  03/31/2018

## 2018-03-30 NOTE — Assessment & Plan Note (Signed)
Sinus tachycardia noted on EKG.

## 2018-03-30 NOTE — Assessment & Plan Note (Signed)
1. Patient with high grade b cell lymphoma. Stage II. He is on chemotherapy at present 2. Mass shrunk with chemotherapy on initial reimaging. Hoping for cure with continued treatment- patient optomistic 3. Neutropenia noted. He is doing a great job being careful today- covered in blanket that can be washed and wearing mask 4. Needs some health maintenance items but we opted to focus on getting him home and out of our office to minimize any exposure risks  Answered patient's questions regarding this. He is thrilled with oncology care and once again thankful to Dr. Jerline Pain for sending him to hospital.

## 2018-03-30 NOTE — Patient Instructions (Addendum)
Health Maintenance Due  Topic Date Due  . OPHTHALMOLOGY EXAM - Patient will schedule once things calm down with health. 03/23/2016  . FOOT EXAM - Today at office visit 01/31/2018   Stop glimepiride. Let me know if sugars get above 200 regularly through checks at oncology office either through phone call or mychart and I can look at those #s  Start lovenox back per Dr. Burr Medico instructions

## 2018-03-30 NOTE — Telephone Encounter (Signed)
I called pt and spoke with his wife [name] and reviewed his lab results from yesterday. His thrombocytopenia has improved, he had loose bowel movements twice yesterday, nonbloody.  I recommend him to start Lovenox injection today.  He has lab and follow-up appointment with [place] tomorrow.  She voiced good understanding.

## 2018-03-30 NOTE — Assessment & Plan Note (Signed)
S: very well controlled on janumet 50-189m BID and glimepiride 25mLab Results  Component Value Date   HGBA1C 5.4 03/10/2018   HGBA1C 6.6 12/28/2017   HGBA1C 7.3 (H) 08/29/2017   A/P: a1c improved in hospital- we will cut out the glimepiride. I really dont want him to have hypoglycemia so we will tolerate some a1c elevation and CBGs up to low 200s- asked him to let me know if CBGs regularly above 200 with twice weekly oncology labs he is doing

## 2018-03-30 NOTE — Assessment & Plan Note (Signed)
1. Submassive PE and R DVT on levenox 2. Oncology was Holding lovenox due to thrombocytopenia and GI bleed. May need IVC filter 3. Restart is per oncology with thrombocytopenia improved- and anemia better after he was given  2 units blood given on the 17th- they instructed him to restart. They asked my opinion and I agreed about restart- luckily no rectal bleeding since Monday 4. Consider today as restart for 6 months of therapy since period off medicine

## 2018-03-30 NOTE — Progress Notes (Signed)
Subjective:  Mark Frey is a 62 y.o. year old very pleasant male patient who presents for/with See problem oriented charting ROS- fatigue noted. Swelling in right leg better. No chest pain. Some shortness of breath but improving. Feels better since transfusion 2 units yesterday   Past Medical History-  Patient Active Problem List   Diagnosis Date Noted  . 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 Bilateral pulmonary embolism ([place]) 03/10/2018    Priority: High  . Controlled diabetes mellitus type II without complication ([place]) 03/12/1600    Priority: High  . Morbid obesity ([place]) 12/29/2009    Priority: High  . Tachycardia 01/31/2017    Priority: Medium  . Hypertension 06/05/2014    Priority: Medium  . Irritable bowel syndrome 03/30/2010    Priority: Medium  . Hyperlipidemia 05/12/2007    Priority: Medium  . Dysmetabolic syndrome X 09/32/3557    Priority: Low  . BACK PAIN WITH RADICULOPATHY 06/09/2007    Priority: Low  . ALLERGIC RHINITIS 05/12/2007    Priority: Low  . Port-A-Cath in place 03/27/2018  . Acute deep vein thrombosis (DVT) of femoral vein of right lower extremity ([place])   . Anemia 03/10/2018  . Hypoglycemia 03/10/2018  . Occult blood in stools 03/10/2018  . Abdominal mass 03/10/2018    Medications- reviewed and updated Current Outpatient Medications  Medication Sig Dispense Refill  . acetaminophen (TYLENOL) 325 MG tablet Take 2 tablets (650 mg total) by mouth every 6 (six) hours as needed for mild pain (or Fever >/= 101).    [name] allopurinol (ZYLOPRIM) 300 MG tablet Take 1 tablet (300 mg total) by mouth daily. 30 tablet 0  . Choline Fenofibrate 135 MG capsule TAKE 1 CAPSULE DAILY 90 capsule 1  . docusate sodium (COLACE) 100 MG capsule Take 1 capsule (100 mg total) by mouth 2 (two) times daily. 10 capsule 0  . enoxaparin (LOVENOX) 150 MG/ML injection Inject 1 mL (150 mg total) into the skin daily. 30 Syringe 0  . glucose blood (FREESTYLE  TEST STRIPS) test strip Use to check blood sugar daily 100 each 4  . JANUMET 50-1000 MG tablet TAKE 1 TABLET TWICE DAILY  WITH MEALS 180 tablet 3  . Lancets (FREESTYLE) lancets 1 each by Other route as directed. Use as instructed     . lidocaine-prilocaine (EMLA) cream Apply 1 application topically as needed. 30 g 2  . Multiple Vitamin (MULTIVITAMIN) capsule Take 1 capsule by mouth daily.      . ondansetron (ZOFRAN) 8 MG tablet Take 1 tablet (8 mg total) by mouth 2 (two) times daily as needed (Nausea or vomiting). 30 tablet 1  . polyethylene glycol (MIRALAX / GLYCOLAX) packet Take 17 g by mouth daily as needed for mild constipation. 14 each 0  . prochlorperazine (COMPAZINE) 10 MG tablet Take 1 tablet (10 mg total) by mouth every 6 (six) hours as needed (Nausea or vomiting). 30 tablet 1   No current facility-administered medications for this visit.     Objective: BP 138/76 (BP Location: Left Arm, Cuff Size: Normal)   Pulse (!) 119   Temp 100 F (37.8 C) (Oral)   Ht 5' 9"  (1.753 m)   Wt 237 lb 6.4 oz (107.7 kg)   SpO2 97%   BMI 35.06 kg/m  Gen: covered in blanket, appears fatigued CV: tachycardic but regular no murmurs rubs or gallops Lungs: CTAB no crackles, wheeze, rhonchi Abdomen: soft/nontender/nondistended/normal bowel sounds.  Ext: trace edema left, 2+  edema on right though no calf pain Skin: warm, dry Neuro: wheelchair bound  EKG: sinus tahycardia with rate of 122, normal axis, normal intervals, no obvious hypertrophy, 1 PAC noted. No st or t wave changes- due to tachycardia p waves blend with t waves.   Assessment/Plan:  Regularly scheduled 3 month visit- we used opportunity to go over hospitalization. 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 Patient with high grade b cell lymphoma. Stage II. He is on chemotherapy at present 2. Mass shrunk with chemotherapy on initial  reimaging. Hoping for cure with continued treatment- patient optomistic 3. Neutropenia noted. He is doing a great job being careful today- covered in blanket that can be washed and wearing mask 4. Needs some health maintenance items but we opted to focus on getting him home and out of our office to minimize any exposure risks  Answered patient's questions regarding this. He is thrilled with oncology care and once again thankful to Dr. Jerline Pain for sending him to hospital.   Bilateral pulmonary embolism ([place]) 1. Submassive PE and R DVT on levenox 2. Oncology was Holding lovenox due to thrombocytopenia and GI bleed. May need IVC filter 3. Restart is per oncology with thrombocytopenia improved- and anemia better after he was given  2 units blood given on the 17th- they instructed him to restart. They asked my opinion and I agreed about restart- luckily no rectal bleeding since Monday 4. Consider today as restart for 6 months of therapy since period off medicine  Controlled diabetes mellitus type II without complication ([facility]) S: very well controlled on janumet 50-117m BID and glimepiride 233mLab Results  Component Value Date   HGBA1C 5.4 03/10/2018   HGBA1C 6.6 12/28/2017   HGBA1C 7.3 (H) 08/29/2017   A/P: a1c improved in hospital- we will cut out the glimepiride. I really dont want him to have hypoglycemia so we will tolerate some a1c elevation and CBGs up to low 200s- asked him to let me know if CBGs regularly above 200 with twice weekly oncology labs he is doing   Tachycardia Sinus tachycardia noted on EKG.   Future Appointments  Date Time Provider [place]  8:00 AM [facility] LAB 4 [facility] None  03/31/2018  8:15 AM [facility] [place] [facility] None  03/31/2018  9:00 AM BuAlla FeelingNP [facility] None  03/31/2018 10:30 AM [facility] INFUSION [facility] None  04/03/2018 10:30 AM [facility] LAB 1 [facility] None  04/03/2018 11:00 AM [facility] [place]  [facility] None  04/06/2018  1:00 PM [facility] LAB 2 [facility] None  04/06/2018  1:15 PM [facility] [place] [facility] None  04/06/2018  1:30 PM BuAlla FeelingNP [facility] None   We discussed 3-6 month follow up depending on how htings are going with oncology  Lab/Order associations: Controlled type 2 diabetes mellitus without complication, without long-term current use of insulin ([place]- Plan: CANCELED: Microalbumin / creatinine urine ratio  Tachycardia - Plan: EKG 12-Lead  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[facility])  Bilateral pulmonary embolism ([facility])  Meds ordered this encounter  Medications  . glucose blood (FREESTYLE TEST STRIPS) test strip    Sig: Use to check blood sugar daily    Dispense:  100 each    Refill:  4    Return precautions advised.  StGarret ReddishMD

## 2018-03-30 NOTE — Telephone Encounter (Signed)
Copied from [place] [phone] [phone]. Topic: General - Other >> Mar 30, 2018  1:35 PM Gardiner Ramus wrote: Reason for CRM: pt wife called and stated the hospital called in the wrong walker from [facility]. He needs a Rolator walker with all 4 wheels and a seat. Please advise Cb#[phone]

## 2018-03-30 NOTE — Telephone Encounter (Signed)
PA submitted for glucose blood (FREESTYLE TEST STRIPS) test strip. Awaiting approval/denial at this time

## 2018-03-31 ENCOUNTER — Inpatient Hospital Stay (HOSPITAL_BASED_OUTPATIENT_CLINIC_OR_DEPARTMENT_OTHER): Payer: BLUE CROSS/BLUE SHIELD | Admitting: Nurse Practitioner

## 2018-03-31 ENCOUNTER — Other Ambulatory Visit: Payer: Self-pay

## 2018-03-31 ENCOUNTER — Inpatient Hospital Stay: Payer: BLUE CROSS/BLUE SHIELD

## 2018-03-31 ENCOUNTER — Telehealth: Payer: Self-pay

## 2018-03-31 ENCOUNTER — Encounter: Payer: Self-pay | Admitting: Nurse Practitioner

## 2018-03-31 ENCOUNTER — Telehealth: Payer: Self-pay | Admitting: Nurse Practitioner

## 2018-03-31 VITALS — BP 110/68 | HR 96 | Temp 98.6°F | Resp 18

## 2018-03-31 DIAGNOSIS — E785 Hyperlipidemia, unspecified: Secondary | ICD-10-CM | POA: Diagnosis not present

## 2018-03-31 DIAGNOSIS — C8510 Unspecified B-cell lymphoma, unspecified site: Secondary | ICD-10-CM

## 2018-03-31 DIAGNOSIS — I2699 Other pulmonary embolism without acute cor pulmonale: Secondary | ICD-10-CM

## 2018-03-31 DIAGNOSIS — D509 Iron deficiency anemia, unspecified: Secondary | ICD-10-CM | POA: Diagnosis not present

## 2018-03-31 DIAGNOSIS — Z79899 Other long term (current) drug therapy: Secondary | ICD-10-CM

## 2018-03-31 DIAGNOSIS — D500 Iron deficiency anemia secondary to blood loss (chronic): Secondary | ICD-10-CM

## 2018-03-31 DIAGNOSIS — D696 Thrombocytopenia, unspecified: Secondary | ICD-10-CM

## 2018-03-31 DIAGNOSIS — K922 Gastrointestinal hemorrhage, unspecified: Secondary | ICD-10-CM | POA: Diagnosis not present

## 2018-03-31 DIAGNOSIS — R1909 Other intra-abdominal and pelvic swelling, mass and lump: Secondary | ICD-10-CM | POA: Diagnosis not present

## 2018-03-31 DIAGNOSIS — Z95828 Presence of other vascular implants and grafts: Secondary | ICD-10-CM

## 2018-03-31 DIAGNOSIS — D709 Neutropenia, unspecified: Secondary | ICD-10-CM | POA: Diagnosis not present

## 2018-03-31 DIAGNOSIS — C8513 Unspecified B-cell lymphoma, intra-abdominal lymph nodes: Secondary | ICD-10-CM

## 2018-03-31 DIAGNOSIS — R1903 Right lower quadrant abdominal swelling, mass and lump: Secondary | ICD-10-CM | POA: Diagnosis not present

## 2018-03-31 DIAGNOSIS — C8373 Burkitt lymphoma, intra-abdominal lymph nodes: Secondary | ICD-10-CM | POA: Diagnosis not present

## 2018-03-31 DIAGNOSIS — I82491 Acute embolism and thrombosis of other specified deep vein of right lower extremity: Secondary | ICD-10-CM | POA: Diagnosis not present

## 2018-03-31 DIAGNOSIS — T451X5A Adverse effect of antineoplastic and immunosuppressive drugs, initial encounter: Secondary | ICD-10-CM | POA: Diagnosis not present

## 2018-03-31 DIAGNOSIS — E119 Type 2 diabetes mellitus without complications: Secondary | ICD-10-CM | POA: Diagnosis not present

## 2018-03-31 LAB — CMP (CANCER CENTER ONLY)
ALBUMIN: 2.5 g/dL — AB (ref 3.5–5.0)
ALT: 85 U/L — ABNORMAL HIGH (ref 0–44)
ANION GAP: 9 (ref 5–15)
AST: 68 U/L — ABNORMAL HIGH (ref 15–41)
Alkaline Phosphatase: 51 U/L (ref 38–126)
BILIRUBIN TOTAL: 0.6 mg/dL (ref 0.3–1.2)
BUN: 8 mg/dL (ref 8–23)
CHLORIDE: 100 mmol/L (ref 98–111)
CO2: 23 mmol/L (ref 22–32)
Calcium: 9.2 mg/dL (ref 8.9–10.3)
Creatinine: 0.76 mg/dL (ref 0.61–1.24)
GFR, Est AFR Am: >60 mL/min (ref >60)
GFR, Estimated: >60 mL/min (ref >60)
GLUCOSE: 171 mg/dL — AB (ref 70–99)
POTASSIUM: 3.1 mmol/L — AB (ref 3.5–5.1)
SODIUM: 132 mmol/L — AB (ref 135–145)
TOTAL PROTEIN: 6 g/dL — AB (ref 6.5–8.1)

## 2018-03-31 LAB — CBC WITH DIFFERENTIAL (CANCER CENTER ONLY)
BASOS PCT: 0 %
Basophils Absolute: 0 10*3/uL (ref 0.0–0.1)
EOS ABS: 0 10*3/uL (ref 0.0–0.5)
EOS PCT: 1 %
HEMATOCRIT: 25.2 % — AB (ref 38.4–49.9)
Hemoglobin: 8.2 g/dL — ABNORMAL LOW (ref 13.0–17.1)
Lymphocytes Relative: 15 %
Lymphs Abs: 0.2 10*3/uL — ABNORMAL LOW (ref 0.9–3.3)
MCH: 27.8 pg (ref 27.2–33.4)
MCHC: 32.5 g/dL (ref 32.0–36.0)
MCV: 85.4 fL (ref 79.3–98.0)
MONO ABS: 0.7 10*3/uL (ref 0.1–0.9)
Monocytes Relative: 43 %
Neutro Abs: 0.7 10*3/uL — ABNORMAL LOW (ref 1.5–6.5)
Neutrophils Relative %: 41 %
PLATELETS: 164 10*3/uL (ref 140–400)
RBC: 2.95 MIL/uL — AB (ref 4.20–5.82)
RDW: 16.7 % — AB (ref 11.0–14.6)
WBC Count: 1.6 10*3/uL — ABNORMAL LOW (ref 4.0–10.3)

## 2018-03-31 LAB — SAMPLE TO BLOOD BANK

## 2018-03-31 LAB — PREPARE RBC (CROSSMATCH)

## 2018-03-31 MED ORDER — SODIUM CHLORIDE 0.9% FLUSH
10.0000 mL | Freq: Once | INTRAVENOUS | Status: AC
  Administered 2018-03-31: 10 mL
  Filled 2018-03-31: qty 10

## 2018-03-31 MED ORDER — ACETAMINOPHEN 325 MG PO TABS
650.0000 mg | ORAL_TABLET | Freq: Once | ORAL | Status: AC
  Administered 2018-03-31: 650 mg via ORAL

## 2018-03-31 MED ORDER — HEPARIN SOD (PORK) LOCK FLUSH 100 UNIT/ML IV SOLN
500.0000 [IU] | Freq: Once | INTRAVENOUS | Status: AC
  Administered 2018-03-31: 500 [IU]
  Filled 2018-03-31: qty 5

## 2018-03-31 MED ORDER — BLOOD GLUCOSE METER KIT: PACK | 0 refills | Status: DC

## 2018-03-31 MED ORDER — ACETAMINOPHEN 325 MG PO TABS
ORAL_TABLET | ORAL | Status: AC
  Filled 2018-03-31: qty 2

## 2018-03-31 MED ORDER — DIPHENHYDRAMINE HCL 25 MG PO CAPS
25.0000 mg | ORAL_CAPSULE | Freq: Once | ORAL | Status: AC
  Administered 2018-03-31: 25 mg via ORAL

## 2018-03-31 MED ORDER — DIPHENHYDRAMINE HCL 25 MG PO CAPS
ORAL_CAPSULE | ORAL | Status: AC
  Filled 2018-03-31: qty 1

## 2018-03-31 MED ORDER — SODIUM CHLORIDE 0.9 % IV SOLN
250.0000 mL | Freq: Once | INTRAVENOUS | Status: AC
  Administered 2018-03-31: 250 mL via INTRAVENOUS

## 2018-03-31 NOTE — Telephone Encounter (Signed)
Scheduled appt per 7/19 los for 7/22 - all other treatment days are still being worked on - pt wife is aware schedule is still being worked on but she is aware of 7/22 appts and to arrive at 8:30 am

## 2018-03-31 NOTE — Telephone Encounter (Signed)
Order for rolator faxed to [place] at [phone]

## 2018-03-31 NOTE — Telephone Encounter (Signed)
PA was submitted for glucose blood (FREESTYLE TEST STRIPS) test strip.   [facility] updated the outcome for this PA: Unfavorable Request Key: [id number] PA created on: 2018-03-30 10:24:23 -0400   Recommendation made for Accu[employer]k Products. Called the pharmacy to verify

## 2018-03-31 NOTE — Patient Instructions (Signed)

## 2018-03-31 NOTE — Telephone Encounter (Signed)
Completed.

## 2018-04-01 ENCOUNTER — Encounter: Payer: Self-pay | Admitting: Nurse Practitioner

## 2018-04-01 LAB — TYPE AND SCREEN
ABO/RH(D): O POS
Antibody Screen: NEGATIVE
UNIT DIVISION: 0
UNIT DIVISION: 0

## 2018-04-01 LAB — BPAM RBC
Blood Product Expiration Date: 201908162359
Blood Product Expiration Date: 201908162359
ISSUE DATE / TIME: 201907191054
ISSUE DATE / TIME: 201907191054
UNIT TYPE AND RH: 5100
Unit Type and Rh: 5100

## 2018-04-03 ENCOUNTER — Inpatient Hospital Stay: Payer: BLUE CROSS/BLUE SHIELD

## 2018-04-03 ENCOUNTER — Telehealth: Payer: Self-pay | Admitting: Hematology

## 2018-04-03 DIAGNOSIS — M6281 Muscle weakness (generalized): Secondary | ICD-10-CM | POA: Diagnosis not present

## 2018-04-03 DIAGNOSIS — E785 Hyperlipidemia, unspecified: Secondary | ICD-10-CM | POA: Diagnosis not present

## 2018-04-03 DIAGNOSIS — K922 Gastrointestinal hemorrhage, unspecified: Secondary | ICD-10-CM | POA: Diagnosis not present

## 2018-04-03 DIAGNOSIS — D709 Neutropenia, unspecified: Secondary | ICD-10-CM | POA: Diagnosis not present

## 2018-04-03 DIAGNOSIS — I2699 Other pulmonary embolism without acute cor pulmonale: Secondary | ICD-10-CM | POA: Diagnosis not present

## 2018-04-03 DIAGNOSIS — C8373 Burkitt lymphoma, intra-abdominal lymph nodes: Secondary | ICD-10-CM | POA: Diagnosis not present

## 2018-04-03 DIAGNOSIS — R1903 Right lower quadrant abdominal swelling, mass and lump: Secondary | ICD-10-CM | POA: Diagnosis not present

## 2018-04-03 DIAGNOSIS — Z79899 Other long term (current) drug therapy: Secondary | ICD-10-CM | POA: Diagnosis not present

## 2018-04-03 DIAGNOSIS — E119 Type 2 diabetes mellitus without complications: Secondary | ICD-10-CM | POA: Diagnosis not present

## 2018-04-03 DIAGNOSIS — D500 Iron deficiency anemia secondary to blood loss (chronic): Secondary | ICD-10-CM | POA: Diagnosis not present

## 2018-04-03 DIAGNOSIS — D696 Thrombocytopenia, unspecified: Secondary | ICD-10-CM | POA: Diagnosis not present

## 2018-04-03 DIAGNOSIS — C8510 Unspecified B-cell lymphoma, unspecified site: Secondary | ICD-10-CM

## 2018-04-03 DIAGNOSIS — T451X5A Adverse effect of antineoplastic and immunosuppressive drugs, initial encounter: Secondary | ICD-10-CM | POA: Diagnosis not present

## 2018-04-03 DIAGNOSIS — I82491 Acute embolism and thrombosis of other specified deep vein of right lower extremity: Secondary | ICD-10-CM | POA: Diagnosis not present

## 2018-04-03 LAB — CBC WITH DIFFERENTIAL (CANCER CENTER ONLY)
BASOS ABS: 0.1 10*3/uL (ref 0.0–0.1)
Basophils Relative: 0 %
EOS ABS: 0 10*3/uL (ref 0.0–0.5)
EOS PCT: 0 %
HCT: 32 % — ABNORMAL LOW (ref 38.4–49.9)
Hemoglobin: 10.6 g/dL — ABNORMAL LOW (ref 13.0–17.1)
LYMPHS ABS: 0.8 10*3/uL — AB (ref 0.9–3.3)
LYMPHS PCT: 4 %
MCH: 28.3 pg (ref 27.2–33.4)
MCHC: 33.2 g/dL (ref 32.0–36.0)
MCV: 85.2 fL (ref 79.3–98.0)
MONO ABS: 2 10*3/uL — AB (ref 0.1–0.9)
Monocytes Relative: 9 %
Neutro Abs: 20.1 10*3/uL — ABNORMAL HIGH (ref 1.5–6.5)
Neutrophils Relative %: 87 %
PLATELETS: 540 10*3/uL — AB (ref 140–400)
RBC: 3.75 MIL/uL — ABNORMAL LOW (ref 4.20–5.82)
RDW: 17.8 % — AB (ref 11.0–14.6)
WBC Count: 23 10*3/uL — ABNORMAL HIGH (ref 4.0–10.3)

## 2018-04-03 LAB — CMP (CANCER CENTER ONLY)
ALT: 57 U/L — ABNORMAL HIGH (ref 0–44)
AST: 22 U/L (ref 15–41)
Albumin: 2.9 g/dL — ABNORMAL LOW (ref 3.5–5.0)
Alkaline Phosphatase: 103 U/L (ref 38–126)
Anion gap: 10 (ref 5–15)
BUN: 9 mg/dL (ref 8–23)
CHLORIDE: 103 mmol/L (ref 98–111)
CO2: 25 mmol/L (ref 22–32)
Calcium: 10 mg/dL (ref 8.9–10.3)
Creatinine: 0.74 mg/dL (ref 0.61–1.24)
Glucose, Bld: 170 mg/dL — ABNORMAL HIGH (ref 70–99)
POTASSIUM: 3.5 mmol/L (ref 3.5–5.1)
SODIUM: 138 mmol/L (ref 135–145)
Total Bilirubin: 0.3 mg/dL (ref 0.3–1.2)
Total Protein: 6.2 g/dL — ABNORMAL LOW (ref 6.5–8.1)

## 2018-04-03 LAB — SAMPLE TO BLOOD BANK

## 2018-04-03 NOTE — Patient Instructions (Signed)

## 2018-04-03 NOTE — Telephone Encounter (Signed)
Scheduled appt per 7/19 los - pt wife is aware of appts added.

## 2018-04-04 ENCOUNTER — Encounter: Payer: Self-pay | Admitting: *Deleted

## 2018-04-05 ENCOUNTER — Telehealth: Payer: Self-pay

## 2018-04-05 ENCOUNTER — Telehealth: Payer: Self-pay | Admitting: Hematology

## 2018-04-05 ENCOUNTER — Encounter (HOSPITAL_COMMUNITY): Payer: Self-pay | Admitting: Hematology

## 2018-04-05 NOTE — Progress Notes (Signed)
HEMATOLOGY/ONCOLOGY CONSULTATION NOTE  Date of Service: .04/06/2018  Patient Care Team: [name], MD as PCP - General (Family Medicine)  CHIEF COMPLAINTS/PURPOSE OF CONSULTATION:  High grade B-cell lymphoma   HISTORY OF PRESENTING ILLNESS:   Mark Frey is a wonderful 62 y.o. male who has been referred to [place] by my colleague Dr. 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 He is accompanied today by his wife. The pt reports that he is doing well overall.   The pt started R-EPOCH every 3 weeks with neulasta support on 03/17/18. He presented to the ED with right leg swelling, found to be a DVT and bilateral PE, which resulted in the incidental finding of a lower abdomen tumor measuring 18.7 x 18.5 x 17.8 cm. He denies any abdominal pains or changes in his bowel habits prior to this finding. He first noted blood in the stools 4-5 days prior to appearing to the ED.  The pt reports that he has been taking 192m Lovenox injections once each day, except for the days in which he had blood in the stools. He hasn't had blood in his stools for the last 7 days.   He has had GI bleed related to bowel involvement with his lymphoma - this is now resolving and his hgb is stabilizing.  He notes that his scrotum and leg swelling has decreased. He denies having CP or SOB at any point from his PE.   Most recent lab results (04/06/18) of CBC w/diff, CMP, Reticulocytes  is as follows: all values are WNL except for WBC at 15.3k, RBC at 3.65, HGB at 10.5, HCT at 31.4, RDW at 17.8, PLT at 640k, ANC at 12.7k, Monocytes abs at 1.6k, Glucose at 162, Total Protein at 6.2, Albumin at 3.1, Total bilirubin at <0.2, Retic ct pct at 2.5%. Uric acid 04/06/18 was low at 3.0 LDH 04/06/18 elevated at 251  On review of systems, pt reports remaining right leg swelling, blood in the stools 7 days ago, two bowel  movements each day, left leg swelling, improved scrotal swelling, eating well, and denies problems with his port, abdominal pains, constipation, diarrhea, jaw pain, pain along the spine, problems passing urine, and any other symptoms.    MEDICAL HISTORY:  Past Medical History:  Diagnosis Date  . ALLERGIC RHINITIS   . Diabetes mellitus   . Hyperlipidemia     SURGICAL HISTORY: Past Surgical History:  Procedure Laterality Date  . BIOPSY  03/15/2018   Procedure: BIOPSY;  Surgeon: JMilus Banister MD;  Location: [facility];  Service: Endoscopy;;  . CLEFT PALATE REPAIR    . COLONOSCOPY N/A 03/15/2018   Procedure: COLONOSCOPY;  Surgeon: JMilus Banister MD;  Location: [facility];  Service: Endoscopy;  Laterality: N/A;  . deviated septum repair     slight improvement  . ESOPHAGOGASTRODUODENOSCOPY N/A 03/15/2018   Procedure: ESOPHAGOGASTRODUODENOSCOPY (EGD);  Surgeon: JMilus Banister MD;  [facility];  Service: Endoscopy;  Laterality: N/A;  . IR IMAGING GUIDED PORT INSERTION  03/17/2018  . TONSILLECTOMY      SOCIAL HISTORY: Social History   Socioeconomic History  . Marital status: Married    Spouse name: Not on file  . Number of children: Not on file  . Years of education: Not on file  . Highest education level: Not on file  Occupational History  . Not on file  Social Needs  .  Financial resource strain: Not on file  . Food insecurity:    Worry: Not on file    Inability: Not on file  . Transportation needs:    Medical: Not on file    Non-medical: Not on file  Tobacco Use  . Smoking status: Never Smoker  . Smokeless tobacco: Never Used  Substance and Sexual Activity  . Alcohol use: Yes    Comment: occasional  . Drug use: No  . Sexual activity: Yes  Lifestyle  . Physical activity:    Days per week: Not on file    Minutes per session: Not on file  . Stress: Not on file  Relationships  . Social connections:    Talks on phone: Not on file    Gets together:  Not on file    Attends religious service: Not on file    Active member of club or organization: Not on file    Attends meetings of clubs or organizations: Not on file    Relationship status: Not on file  . Intimate partner violence:    Fear of current or ex partner: Not on file    Emotionally abused: Not on file    Physically abused: Not on file    Forced sexual activity: Not on file  Other Topics Concern  . Not on file  Social History Narrative   Married 1985. No kids. 4 small dogs.       Works in Financial trader, residential      Hobbies: work on cars, Haematologist, exercise as able    FAMILY HISTORY: Family History  Problem Relation Age of Onset  . Lung cancer Mother        smoker  . Brain cancer Mother        metastasis  . AAA (abdominal aortic aneurysm) Father        smoker    ALLERGIES:  is allergic to ciprofloxacin.  MEDICATIONS:  Current Outpatient Medications  Medication Sig Dispense Refill  . acetaminophen (TYLENOL) 325 MG tablet Take 2 tablets (650 mg total) by mouth every 6 (six) hours as needed for mild pain (or Fever >/= 101).    [name] allopurinol (ZYLOPRIM) 300 MG tablet Take 1 tablet (300 mg total) by mouth daily. 30 tablet 0  . blood glucose meter kit and supplies Dispense based on patient and insurance preference. Use daily as directed. (E11.9). 1 each 0  . Choline Fenofibrate 135 MG capsule TAKE 1 CAPSULE DAILY 90 capsule 1  . docusate sodium (COLACE) 100 MG capsule Take 1 capsule (100 mg total) by mouth 2 (two) times daily. 10 capsule 0  . enoxaparin (LOVENOX) 150 MG/ML injection Inject 1 mL (150 mg total) into the skin daily. 30 Syringe 0  . glucose blood (FREESTYLE TEST STRIPS) test strip Use to check blood sugar daily 100 each 4  . JANUMET 50-1000 MG tablet TAKE 1 TABLET TWICE DAILY  WITH MEALS 180 tablet 3  . Lancets (FREESTYLE) lancets 1 each by Other route as directed. Use as instructed     . lidocaine-prilocaine (EMLA) cream Apply 1 application  topically as needed. 30 g 2  . Multiple Vitamin (MULTIVITAMIN) capsule Take 1 capsule by mouth daily.      . ondansetron (ZOFRAN) 8 MG tablet Take 1 tablet (8 mg total) by mouth 2 (two) times daily as needed (Nausea or vomiting). 30 tablet 1  . polyethylene glycol (MIRALAX / GLYCOLAX) packet Take 17 g by mouth daily as needed for mild constipation. 14 each  0  . prochlorperazine (COMPAZINE) 10 MG tablet Take 1 tablet (10 mg total) by mouth every 6 (six) hours as needed (Nausea or vomiting). 30 tablet 1   No current facility-administered medications for this visit.     REVIEW OF SYSTEMS:    10 Point review of Systems was done is negative except as noted above.  PHYSICAL EXAMINATION: ECOG PERFORMANCE STATUS: 1 - Symptomatic but completely ambulatory  . Vitals:   04/06/18 1546  BP: 130/76  Pulse: (!) 108  Resp: 18  Temp: 98.4 F (36.9 C)  SpO2: 97%   Filed Weights   04/06/18 1546  Weight: 237 lb 4.8 oz (107.6 kg)   .Body mass index is 35.04 kg/m.  GENERAL:alert, in no acute distress and comfortable SKIN: no acute rashes, no significant lesions EYES: conjunctiva are pink and non-injected, sclera anicteric OROPHARYNX: MMM, no exudates, no oropharyngeal erythema or ulceration NECK: supple, no JVD LYMPH:  no palpable lymphadenopathy in the cervical, axillary or inguinal regions LUNGS: clear to auscultation b/l with normal respiratory effort HEART: regular rate & rhythm ABDOMEN:  normoactive bowel sounds , non tender, not distended. Extremity: b/l 2+ pedal edema, no overt calf tenderness at this time. PSYCH: alert & oriented x 3 with fluent speech NEURO: no focal motor/sensory deficits  LABORATORY DATA:  I have reviewed the data as listed  . CBC Latest Ref Rng & Units 04/06/2018 04/03/2018 03/31/2018  WBC 4.0 - 10.3 K/uL 15.3(H) 23.0(H) 1.6(L)  Hemoglobin 13.0 - 17.1 g/dL 10.5(L) 10.6(L) 8.2(L)  Hematocrit 38.4 - 49.9 % 31.4(L) 32.0(L) 25.2(L)  Platelets 140 - 400 K/uL  640(H) 540(H) 164    . CMP Latest Ref Rng & Units 04/06/2018 04/03/2018 03/31/2018  Glucose 70 - 99 mg/dL 162(H) 170(H) 171(H)  BUN 8 - 23 mg/dL _0 Creatinine 0.61 - 1.24 mg/dL 0.76 0.74 0.76  Sodium 135 - 145 mmol/L 140 138 132(L)  Potassium 3.5 - 5.1 mmol/L 4.0 3.5 3.1(L)  Chloride 98 - 111 mmol/L 105 103 100  CO2 22 - 32 mmol/L _1 Calcium 8.9 - 10.3 mg/dL 9.7 10.0 9.2  Total Protein 6.5 - 8.1 g/dL 6.2(L) 6.2(L) 6.0(L)  Total Bilirubin 0.3 - 1.2 mg/dL <0.2(L) 0.3 0.6  Alkaline Phos 38 - 126 U/L 93 103 51  AST 15 - 41 U/L 15 22 68(H)  ALT 0 - 44 U/L 26 57(H) 85(H)   03/17/18 Cytogenetics:   03/17/18 Colon Bx:   03/13/18 Peritoneum Biopsy:     RADIOGRAPHIC STUDIES:  I have personally reviewed the radiological images as listed and agreed with the findings in the report. Ct Angio Chest Pe W/cm &/or Wo Cm  Result Date: 03/10/2018 CLINICAL DATA:  Acute DVT in the right lower extremity by report. Palpable right abdominal mass. EXAM: CT ANGIOGRAPHY CHEST CT ABDOMEN AND PELVIS WITH CONTRAST TECHNIQUE: Multidetector CT imaging of the chest was performed using the standard protocol during bolus administration of intravenous contrast. Multiplanar CT image reconstructions and MIPs were obtained to evaluate the vascular anatomy. Multidetector CT imaging of the abdomen and pelvis was performed using the standard protocol during bolus administration of intravenous contrast. CONTRAST:  144m ISOVUE-370 IOPAMIDOL (ISOVUE-370) INJECTION 76% COMPARISON:  None. FINDINGS: CTA CHEST FINDINGS Cardiovascular: The study is moderate quality for the evaluation of pulmonary embolism, limited by motion degradation particularly in the lower lungs. There are lobar, segmental and subsegmental pulmonary emboli in the left upper, left lower and right upper lobes. There are segmental and subsegmental pulmonary  emboli in the right lower lobe. No central or saddle pulmonary emboli. Dilated main pulmonary artery  (3.5 cm diameter). Normal course and caliber of thoracic aorta. Top-normal heart size. Elevated RV/LV ratio 1.1. No significant pericardial fluid/thickening. Left anterior descending coronary atherosclerosis. Mediastinum/Nodes: No discrete thyroid nodules. Unremarkable esophagus. No pathologically enlarged axillary, mediastinal or hilar lymph nodes. Lungs/Pleura: No pneumothorax. No pleural effusion. Subpleural 2 mm basilar left lower lobe solid pulmonary nodule (series 4/image 122). No acute consolidative airspace disease, lung masses or additional significant pulmonary nodules. Musculoskeletal: No aggressive appearing focal osseous lesions. Moderate thoracic spondylosis. Review of the MIP images confirms the above findings. CT ABDOMEN and PELVIS FINDINGS Hepatobiliary: Normal liver size. Solitary subcentimeter hypodense right liver dome lesion (series 4/image 11), too small to characterize. No additional liver lesions. Normal gallbladder with no radiopaque cholelithiasis. No biliary ductal dilatation. Pancreas: Normal, with no mass or duct dilation. Spleen: Normal size. No mass. Adrenals/Urinary Tract: Normal adrenals. Mild right hydronephrosis with ill-defined soft tissue encasing the right ureteropelvic junction. Nonobstructing 5 mm stone in the posterior lower right kidney. No additional renal stones. No left hydronephrosis. A few subcentimeter hypodense renal cortical lesions scattered in the kidneys bilaterally, too small to characterize. Normal caliber left ureter. Masslike wall thickening in the distal pelvic segment of the right ureter extending to the right ureterovesical junction measuring up to 2.5 x 1.9 x 5.0 cm (series 4/image 72). Otherwise normal nondistended bladder. Stomach/Bowel: Normal non-distended stomach. There is a large irregular infiltrative solid mass in the right lower quadrant measuring up to 18.7 x 18.5 x 17.8 cm (series 4/image 57), which infiltrates and encases multiple distal small  bowel loops and likely the ileocecal region. There is partial encasement of the sigmoid colon by the mass. There is prominent extension of the mass into the right lower retroperitoneum and extraperitoneal right pelvis with encasement of the right external iliac and proximal right common iliac vasculature and with infiltration of the right iliopsoas muscle. There is dilatation of the involved right sided small bowel loops. No proximal small bowel dilatation. The transverse and left colon are collapsed with no wall thickening in these locations. Moderate sigmoid diverticulosis. Vascular/Lymphatic: Atherosclerotic nonaneurysmal abdominal aorta. Patent portal, splenic, hepatic and renal veins. There is bulky soft tissue throughout right external and common iliac nodal chains. Reproductive: Top-normal size prostate with coarse nonspecific internal prostatic calcifications. Other: No pneumoperitoneum, ascites or focal fluid collection. Trace presacral fluid. Asymmetric fat stranding throughout subcutaneous right proximal lower extremity and right flank. Musculoskeletal: No aggressive appearing focal osseous lesions. Moderate lumbar spondylosis. Review of the MIP images confirms the above findings. IMPRESSION: CT CHEST: 1. Acute bilateral pulmonary embolism involving lobar, segmental and subsegmental branches. No saddle pulmonary emboli. Positive for acute PE with CT evidence of right heart strain (RV/LV Ratio = 1.1) consistent with at least submassive (intermediate risk) PE. The presence of right heart strain has been associated with an increased risk of morbidity and mortality. Please activate Code PE by paging [phone]. 2. Dilated main pulmonary artery, suggesting pulmonary arterial hypertension. 3. 1 vessel coronary atherosclerosis. 4. No findings suspicious for metastatic disease in the chest. Solitary 2 mm subpleural left lower lobe solid pulmonary nodule, for which follow-up chest CT is advised in 3 months. CT  ABDOMEN and PELVIS: 1. Large irregular infiltrative solid mass in the right lower quadrant measuring up to 18.7 x 18.5 x 17.8 cm, infiltrating and encasing multiple distal small bowel loops and likely the ileocecal region, partially encasing the sigmoid colon,  with prominent extension into the right lower retroperitoneum and extraperitoneal right pelvis with encasement of right external iliac and proximal right common iliac vasculature and infiltration of the right iliopsoas muscle. 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 Differential includes small bowel GIST or adenocarcinoma. 2. Mild right hydronephrosis due to thick soft tissue encasing the right ureter most prominently at the right UPJ and right UVJ. 3. Nonobstructing right nephrolithiasis. 4. Moderate sigmoid diverticulosis. 5.  Aortic Atherosclerosis (ICD10-I70.0). Critical Value/emergent results were called by telephone at the time of interpretation on 03/10/2018 at 5:38 pm to Dr. Kennith Maes , who verbally acknowledged these results. Electronically Signed   By: Ilona Sorrel M.D.   On: 03/10/2018 17:41   US Scrotum  Result Date: 03/15/2018 CLINICAL DATA:  Scrotal swelling EXAM: SCROTAL ULTRASOUND DOPPLER ULTRASOUND OF THE TESTICLES TECHNIQUE: Complete ultrasound examination of the testicles, epididymis, and other scrotal structures was performed. Color and spectral Doppler ultrasound were also utilized to evaluate blood flow to the testicles. COMPARISON:  CT 03/10/2018 FINDINGS: Right testicle Measurements: 4.2 x 2.5 x 2.6 cm. No mass or microlithiasis visualized. Left testicle Measurements: 4.2 x 2.5 x 2.7 cm. No mass or microlithiasis visualized. Right epididymis:  Cyst measuring 9 x 8 x 11 mm Left epididymis:  Cyst measuring 6 x 4 mm Hydrocele:  Small left hydrocele.  Moderate right hydrocele. Varicocele:  Small right varicocele. Significant scrotal skin thickening with diffuse edema in the soft tissues Pulsed Doppler interrogation of both testes  demonstrates normal low resistance arterial and venous waveforms bilaterally. IMPRESSION: 1. Negative for testicular torsion or intratesticular mass 2. Bilateral hydroceles, small on the left and moderate on the right 3. Small right varicocele 4. Extensive scrotal edema and skin thickening Electronically Signed   By: Donavan Foil M.D.   On: 03/15/2018 20:19   Ct Abdomen Pelvis W Contrast  Result Date: 03/10/2018 CLINICAL DATA:  Acute DVT in the right lower extremity by report. Palpable right abdominal mass. EXAM: CT ANGIOGRAPHY CHEST CT ABDOMEN AND PELVIS WITH CONTRAST TECHNIQUE: Multidetector CT imaging of the chest was performed using the standard protocol during bolus administration of intravenous contrast. Multiplanar CT image reconstructions and MIPs were obtained to evaluate the vascular anatomy. Multidetector CT imaging of the abdomen and pelvis was performed using the standard protocol during bolus administration of intravenous contrast. CONTRAST:  128m ISOVUE-370 IOPAMIDOL (ISOVUE-370) INJECTION 76% COMPARISON:  None. FINDINGS: CTA CHEST FINDINGS Cardiovascular: The study is moderate quality for the evaluation of pulmonary embolism, limited by motion degradation particularly in the lower lungs. There are lobar, segmental and subsegmental pulmonary emboli in the left upper, left lower and right upper lobes. There are segmental and subsegmental pulmonary emboli in the right lower lobe. No central or saddle pulmonary emboli. Dilated main pulmonary artery (3.5 cm diameter). Normal course and caliber of thoracic aorta. Top-normal heart size. Elevated RV/LV ratio 1.1. No significant pericardial fluid/thickening. Left anterior descending coronary atherosclerosis. Mediastinum/Nodes: No discrete thyroid nodules. Unremarkable esophagus. No pathologically enlarged axillary, mediastinal or hilar lymph nodes. Lungs/Pleura: No pneumothorax. No pleural effusion. Subpleural 2 mm basilar left lower lobe solid  pulmonary nodule (series 4/image 122). No acute consolidative airspace disease, lung masses or additional significant pulmonary nodules. Musculoskeletal: No aggressive appearing focal osseous lesions. Moderate thoracic spondylosis. Review of the MIP images confirms the above findings. CT ABDOMEN and PELVIS FINDINGS Hepatobiliary: Normal liver size. Solitary subcentimeter hypodense right liver dome lesion (series 4/image 11), too small to characterize. No additional liver lesions. Normal gallbladder with no radiopaque cholelithiasis.  No biliary ductal dilatation. Pancreas: Normal, with no mass or duct dilation. Spleen: Normal size. No mass. Adrenals/Urinary Tract: Normal adrenals. Mild right hydronephrosis with ill-defined soft tissue encasing the right ureteropelvic junction. Nonobstructing 5 mm stone in the posterior lower right kidney. No additional renal stones. No left hydronephrosis. A few subcentimeter hypodense renal cortical lesions scattered in the kidneys bilaterally, too small to characterize. Normal caliber left ureter. Masslike wall thickening in the distal pelvic segment of the right ureter extending to the right ureterovesical junction measuring up to 2.5 x 1.9 x 5.0 cm (series 4/image 72). Otherwise normal nondistended bladder. Stomach/Bowel: Normal non-distended stomach. There is a large irregular infiltrative solid mass in the right lower quadrant measuring up to 18.7 x 18.5 x 17.8 cm (series 4/image 57), which infiltrates and encases multiple distal small bowel loops and likely the ileocecal region. There is partial encasement of the sigmoid colon by the mass. There is prominent extension of the mass into the right lower retroperitoneum and extraperitoneal right pelvis with encasement of the right external iliac and proximal right common iliac vasculature and with infiltration of the right iliopsoas muscle. There is dilatation of the involved right sided small bowel loops. No proximal small bowel  dilatation. The transverse and left colon are collapsed with no wall thickening in these locations. Moderate sigmoid diverticulosis. Vascular/Lymphatic: Atherosclerotic nonaneurysmal abdominal aorta. Patent portal, splenic, hepatic and renal veins. There is bulky soft tissue throughout right external and common iliac nodal chains. Reproductive: Top-normal size prostate with coarse nonspecific internal prostatic calcifications. Other: No pneumoperitoneum, ascites or focal fluid collection. Trace presacral fluid. Asymmetric fat stranding throughout subcutaneous right proximal lower extremity and right flank. Musculoskeletal: No aggressive appearing focal osseous lesions. Moderate lumbar spondylosis. Review of the MIP images confirms the above findings. IMPRESSION: CT CHEST: 1. Acute bilateral pulmonary embolism involving lobar, segmental and subsegmental branches. No saddle pulmonary emboli. Positive for acute PE with CT evidence of right heart strain (RV/LV Ratio = 1.1) consistent with at least submassive (intermediate risk) PE. The presence of right heart strain has been associated with an increased risk of morbidity and mortality. Please activate Code PE by paging [phone]. 2. Dilated main pulmonary artery, suggesting pulmonary arterial hypertension. 3. 1 vessel coronary atherosclerosis. 4. No findings suspicious for metastatic disease in the chest. Solitary 2 mm subpleural left lower lobe solid pulmonary nodule, for which follow-up chest CT is advised in 3 months. CT ABDOMEN and PELVIS: 1. Large irregular infiltrative solid mass in the right lower quadrant measuring up to 18.7 x 18.5 x 17.8 cm, infiltrating and encasing multiple distal small bowel loops and likely the ileocecal region, partially encasing the sigmoid colon, with prominent extension into the right lower retroperitoneum and extraperitoneal right pelvis with encasement of right external iliac and proximal right common iliac vasculature and  infiltration of the right iliopsoas muscle. 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 Differential includes small bowel GIST or adenocarcinoma. 2. Mild right hydronephrosis due to thick soft tissue encasing the right ureter most prominently at the right UPJ and right UVJ. 3. Nonobstructing right nephrolithiasis. 4. Moderate sigmoid diverticulosis. 5.  Aortic Atherosclerosis (ICD10-I70.0). Critical Value/emergent results were called by telephone at the time of interpretation on 03/10/2018 at 5:38 pm to Dr. Kennith Maes , who verbally acknowledged these results. Electronically Signed   By: Ilona Sorrel M.D.   On: 03/10/2018 17:41   Ct Biopsy  Result Date: 03/17/2018 INDICATION: Recent diagnosis of lymphoma. Please perform CT-guided bone marrow biopsy for tissue diagnostic purposes.  EXAM: CT-GUIDED BONE MARROW BIOPSY AND ASPIRATION MEDICATIONS: None ANESTHESIA/SEDATION: Fentanyl 100 mcg IV; Versed 2 mg IV Sedation Time: 10 Minutes; The patient was continuously monitored during the procedure by the interventional radiology nurse under my direct supervision. COMPLICATIONS: None immediate. PROCEDURE: Informed consent was obtained from the patient following an explanation of the procedure, risks, benefits and alternatives. The patient understands, agrees and consents for the procedure. All questions were addressed. A time out was performed prior to the initiation of the procedure. The patient was positioned left lateral decubitus and non-contrast localization CT was performed of the pelvis to demonstrate the iliac marrow spaces. The operative site was prepped and draped in the usual sterile fashion. Under sterile conditions and local anesthesia, a 22 gauge spinal needle was utilized for procedural planning. Next, an 11 gauge coaxial bone biopsy needle was advanced into the left iliac marrow space. Needle position was confirmed with CT imaging. Initially, bone marrow aspiration was performed. Next, a bone marrow biopsy was  obtained with the 11 gauge outer bone marrow device. Samples were prepared with the cytotechnologist and deemed adequate. The needle was removed intact. Hemostasis was obtained with compression and a dressing was placed. The patient tolerated the procedure well without immediate post procedural complication. IMPRESSION: Successful CT guided left iliac bone marrow aspiration and core biopsy. Electronically Signed   By: Sandi Mariscal M.D.   On: 03/17/2018 10:41   Ct Biopsy  Result Date: 03/13/2018 CLINICAL DATA:  Large infiltrative masses of the peritoneal cavity. EXAM: CT GUIDED CORE BIOPSY OF PERITONEAL MASS ANESTHESIA/SEDATION: 2.0 mg IV Versed; 100 mcg IV Fentanyl Total Moderate Sedation Time:  17 minutes. The patient's level of consciousness and physiologic status were continuously monitored during the procedure by Radiology nursing. PROCEDURE: The procedure risks, benefits, and alternatives were explained to the patient. Questions regarding the procedure were encouraged and answered. The patient understands and consents to the procedure. A time-out was performed prior to initiating the procedure. The operative field was prepped with chlorhexidine in a sterile fashion, and a sterile drape was applied covering the operative field. A sterile gown and sterile gloves were used for the procedure. Local anesthesia was provided with 1% Lidocaine. Under CT guidance, a 17 gauge trocar needle was advanced into the right lower peritoneal cavity. Five separate coaxial 18 gauge core biopsy samples were obtained and submitted in both saline and formalin. Additional CT imaging was performed after outer needle removal. COMPLICATIONS: None FINDINGS: Right lower quadrant portion bulky peritoneal tumor was chosen for sampling. Solid tumor was obtained. There were no immediate complications. IMPRESSION: CT-guided core biopsy performed of bulky tumor in the right lower peritoneal cavity. Electronically Signed   By: Aletta Edouard  M.D.   On: 03/13/2018 17:36   Ct Bone Marrow Biopsy  Result Date: 03/17/2018 INDICATION: Recent diagnosis of lymphoma. Please perform CT-guided bone marrow biopsy for tissue diagnostic purposes. EXAM: CT-GUIDED BONE MARROW BIOPSY AND ASPIRATION MEDICATIONS: None ANESTHESIA/SEDATION: Fentanyl 100 mcg IV; Versed 2 mg IV Sedation Time: 10 Minutes; The patient was continuously monitored during the procedure by the interventional radiology nurse under my direct supervision. COMPLICATIONS: None immediate. PROCEDURE: Informed consent was obtained from the patient following an explanation of the procedure, risks, benefits and alternatives. The patient understands, agrees and consents for the procedure. All questions were addressed. A time out was performed prior to the initiation of the procedure. The patient was positioned left lateral decubitus and non-contrast localization CT was performed of the pelvis to demonstrate the iliac  marrow spaces. The operative site was prepped and draped in the usual sterile fashion. Under sterile conditions and local anesthesia, a 22 gauge spinal needle was utilized for procedural planning. Next, an 11 gauge coaxial bone biopsy needle was advanced into the left iliac marrow space. Needle position was confirmed with CT imaging. Initially, bone marrow aspiration was performed. Next, a bone marrow biopsy was obtained with the 11 gauge outer bone marrow device. Samples were prepared with the cytotechnologist and deemed adequate. The needle was removed intact. Hemostasis was obtained with compression and a dressing was placed. The patient tolerated the procedure well without immediate post procedural complication. IMPRESSION: Successful CT guided left iliac bone marrow aspiration and core biopsy. Electronically Signed   By: Sandi Mariscal M.D.   On: 03/17/2018 10:41   Dg Fluoro Guided Loc Of Needle/cath Tip For Spinal Inject Rt  Result Date: 03/21/2018 CLINICAL DATA:  62 year old male with  history of high-grade B-cell lymphoma. EXAM: FLUOROSCOPICALLY GUIDED LUMBAR PUNCTURE FOR INTRATHECAL CHEMOTHERAPY TECHNIQUE: Informed consent was obtained from the patient prior to the procedure, including potential complications of headache, allergy, and pain. A 'time out' was performed. With the patient prone, the lower back was prepped with Betadine. 1% Lidocaine was used for local anesthesia. Lumbar puncture was performed at the L2-L3 level using a 20 gauge needle with return of clear CSF. A total of 8 mL of CSF was collected into sterile tubing and sent to the laboratory for testing and analysis per orders of the primary medical team. Subsequently, 5 mL of methotrexate was injected into the subarachnoid space. The patient tolerated the procedure well without apparent complication. FLUOROSCOPY TIME:  48 seconds IMPRESSION: 1. Intrathecal injection of chemotherapy without complication. Electronically Signed   By: Vinnie Langton M.D.   On: 03/21/2018 14:39   US Scrotum Doppler  Result Date: 03/15/2018 CLINICAL DATA:  Scrotal swelling EXAM: SCROTAL ULTRASOUND DOPPLER ULTRASOUND OF THE TESTICLES TECHNIQUE: Complete ultrasound examination of the testicles, epididymis, and other scrotal structures was performed. Color and spectral Doppler ultrasound were also utilized to evaluate blood flow to the testicles. COMPARISON:  CT 03/10/2018 FINDINGS: Right testicle Measurements: 4.2 x 2.5 x 2.6 cm. No mass or microlithiasis visualized. Left testicle Measurements: 4.2 x 2.5 x 2.7 cm. No mass or microlithiasis visualized. Right epididymis:  Cyst measuring 9 x 8 x 11 mm Left epididymis:  Cyst measuring 6 x 4 mm Hydrocele:  Small left hydrocele.  Moderate right hydrocele. Varicocele:  Small right varicocele. Significant scrotal skin thickening with diffuse edema in the soft tissues Pulsed Doppler interrogation of both testes demonstrates normal low resistance arterial and venous waveforms bilaterally. IMPRESSION: 1.  Negative for testicular torsion or intratesticular mass 2. Bilateral hydroceles, small on the left and moderate on the right 3. Small right varicocele 4. Extensive scrotal edema and skin thickening Electronically Signed   By: Donavan Foil M.D.   On: 03/15/2018 20:19   Ct Venogram Abd/pel  Result Date: 03/23/2018 CLINICAL DATA:  Right lower extremity DVT EXAM: CT VENOGRAM ABD-PELVIS TECHNIQUE: Multidetector CT imaging of the abdomen and pelvis was performed using the standard protocol during bolus administration of intravenous contrast. Multiplanar reconstructed images and MIPs were obtained and reviewed to evaluate the venous anatomy. CONTRAST:  161m ISOVUE-300 IOPAMIDOL (ISOVUE-300) INJECTION 61% COMPARISON:  03/10/2018 FINDINGS: VASCULAR Veins: Hepatic veins, portal vein, and splenic vein are patent. Superior mesenteric vein is patent. IVC is patent.  Renal veins are patent. Left common and external iliac veins are patent. The right  common and external iliac veins are patent. There is compression of the right common and external iliac veins secondary to residual retroperitoneal hemorrhage and/or infiltrative mass. There is low-density filling defect in the right common femoral vein compatible with DVT. The right femoral vein is grossly patent. No evidence of left common femoral or femoral DVT. Review of the MIP images confirms the above findings. NON-VASCULAR Lower chest: Bibasilar atelectasis for scarring. Hepatobiliary: Liver is unremarkable.  Gallbladder is decompressed. Pancreas: Unremarkable Spleen: Unremarkable Adrenals/Urinary Tract: Nonspecific hypodensity in the mid right kidney. Left kidney is within normal limits. Right hydronephrosis has nearly resolved. Small calculus in the posterior lower pole of the right kidney is unchanged. Stomach/Bowel: Severe wall thickening of a loop of bowel in the right lower quadrant either represents the terminal ileum or cecum. Wall thickening is very nodular and  this is worrisome for adenocarcinoma or lymphoma. There is no evidence of small-bowel obstruction. The appendix is not readily visualized. There is no definite extraluminal bowel gas to suggest perforation. Stomach and duodenum are unremarkable. Lymphatic: There is no abnormal retroperitoneal adenopathy. Mild atherosclerotic calcification of the aorta without aneurysm. Reproductive: Prostate is unremarkable other than calcifications. Other: On the prior study, there was ill-defined and infiltrative soft tissue density throughout the right lower quadrant of the abdomen and extending into the right iliopsoas musculature and right inguinal region. This soft tissue density has largely resolved with some residual stranding throughout the retroperitoneum. The large infiltrative mass is now simply replaced by stranding and ill-defined bowel loops. Expansion of the right iliopsoas musculature has also improved. There is stranding throughout the musculature of the proximal right thigh which is stable. Due to the dramatic improvement of the infiltrative mass, this may represent a dramatic response to chemotherapy if this correlates with the given history. Alternatively, the ill-defined mass may represent hemorrhage which has evolved. Musculoskeletal: No vertebral compression deformity. IMPRESSION: VASCULAR There is no evidence of DVT in the abdomen or pelvis. Specifically, there is no evidence of right common or external iliac vein DVT. The study is positive for DVT in the right common femoral vein. NON-VASCULAR The large infiltrative mass in the right lower quadrant has largely improved. The dramatic improvement either represents a rapid response to chemotherapy or possibly evolving hemorrhage. Correlate clinically with vital signs and hemoglobin levels. The right hydronephrosis has also nearly resolved. There is persistent compression of the right common and external iliac veins due to residual retroperitoneal hemorrhage.  Circumferential mass in the terminal ileum or cecum worrisome for adenocarcinoma or lymphoma. It is conceivable that the wall thickening may be related to hemorrhage in the wall of bowel, but it is rather nodular suggesting neoplasm. Electronically Signed   By: Marybelle Killings M.D.   On: 03/23/2018 08:17   Ir Imaging Guided Port Insertion  Result Date: 03/17/2018 INDICATION: Recent diagnosis of lymphoma. In need of durable intravenous access for chemotherapy administration. EXAM: IMPLANTED PORT A CATH PLACEMENT WITH ULTRASOUND AND FLUOROSCOPIC GUIDANCE COMPARISON:  CT the chest, abdomen and pelvis-03/10/2018 MEDICATIONS: Ancef 2 gm IV; The antibiotic was administered within an appropriate time interval prior to skin puncture. ANESTHESIA/SEDATION: Moderate (conscious) sedation was employed during this procedure. A total of Versed 0.5 mg and Fentanyl 25 mcg was administered intravenously. Moderate Sedation Time: 26 minutes. The patient's level of consciousness and vital signs were monitored continuously by radiology nursing throughout the procedure under my direct supervision. CONTRAST:  None FLUOROSCOPY TIME:  12 seconds (5 mGy) COMPLICATIONS: None immediate. PROCEDURE: The procedure, risks,  benefits, and alternatives were explained to the patient. Questions regarding the procedure were encouraged and answered. The patient understands and consents to the procedure. The right neck and chest were prepped with chlorhexidine in a sterile fashion, and a sterile drape was applied covering the operative field. Maximum barrier sterile technique with sterile gowns and gloves were used for the procedure. A timeout was performed prior to the initiation of the procedure. Local anesthesia was provided with 1% lidocaine with epinephrine. After creating a small venotomy incision, a micropuncture kit was utilized to access the internal jugular vein. Real-time ultrasound guidance was utilized for vascular access including the  acquisition of a permanent ultrasound image documenting patency of the accessed vessel. The microwire was utilized to measure appropriate catheter length. A subcutaneous port pocket was then created along the upper chest wall utilizing a combination of sharp and blunt dissection. The pocket was irrigated with sterile saline. A single lumen ISP power injectable port was chosen for placement. The 8 Fr catheter was tunneled from the port pocket site to the venotomy incision. The port was placed in the pocket. The external catheter was trimmed to appropriate length. At the venotomy, an 8 Fr peel-away sheath was placed over a guidewire under fluoroscopic guidance. The catheter was then placed through the sheath and the sheath was removed. Final catheter positioning was confirmed and documented with a fluoroscopic spot radiograph. The port was accessed with a Huber needle and was noted to easily aspirate and flush. The Port a Catheter was left accessed for the initiation of chemotherapy later today. The venotomy site was closed with an interrupted 4-0 Vicryl suture. The port pocket incision was closed with interrupted 2-0 Vicryl suture and the skin was opposed with a running subcuticular 4-0 Vicryl suture. Dermabond and Steri-strips were applied to both incisions. Dressings were placed. The patient tolerated the procedure well without immediate post procedural complication. FINDINGS: After catheter placement, the tip lies within the superior cavoatrial junction. The catheter aspirates and flushes normally and is ready for immediate use. IMPRESSION: Successful placement of a right internal jugular approach power injectable Port-A-Cath. The catheter is ready for immediate use. Electronically Signed   By: Sandi Mariscal M.D.   On: 03/17/2018 10:42    ASSESSMENT & PLAN:   62 y.o. male with  1. 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  2. RLE DVT and b/l PE  3. GI bleeding from lymphoma involving bowels-- resolved at this time -- will need to monitor with anticoagulation. PLAN: -Discussed patient's most recent labs from 04/06/18, blood counts have bounced back, blood chemistries are stable. HGB at 10.5, ANC at 12.7k, PLT at 640k -Reactive element likely with PLT at 640k due to GI blood loss -Reviewed and discussed 03/10/18 CT C/A/P -Continue 150 mg [place] daily Lovenox unless blood in the stools redevelops  -Stage II bulky disease -No BM or CNS involvement  -Discussed the option to pursue more aggressive treatment regimens incorporating high dose  treatment at an academic center, and would provide a referral if the pt  desired this - pt does not prefer this.  -Will order PET/CT which will be needs for mosre accurate response assessment -Recommended using sports compression socks -The pt has no prohibitive toxicities from continuing C2 of EPOCH-R at this time. -Will hold intrathecal Methotrexate during C2 to not interrupt blood thinners with current blood clot having gone to lungs and high-risk current blood clot in right leg -Will add intrathecal methotrexate back from C3. -Discussed that there may later be a role for ISRT given bulky disease   Second cycle of [facility] admission on Monday 04/10/2018 for 5 days. Neulasta on 04/15/2018 as outpatient and likely outpatient Rtuxan on 8/5 PET/CT on 04/07/2018 -stat or 04/17/2018 RTC with Dr [name] in 2 weeks with labs  All of the patients questions were  answered with apparent satisfaction. The patient knows to call the clinic with any problems, questions or concerns.  The total time spent in the appt was 55 minutes and more than 50% was on counseling and direct patient cares.    Sullivan Lone MD MS AAHIVMS [facility] Hematology/Oncology Physician [facility]  (Office):       [phone] (Work cell):  [phone] (Fax):           [phone]  04/06/2018 4:58 PM  I, Baldwin [place], am acting as a Education administrator for Dr [name].   .I have reviewed the above documentation for accuracy and completeness, and I agree with the above. [name] MD

## 2018-04-05 NOTE — Telephone Encounter (Signed)
I spoke with pathologist Dr. Tresa Moore a few days ago regarding pt's diagnosis. Based on the [facility] results, the final diagnosis is Burkitt lymphoma. I discussed with pt and his wife over the phone yesterday. I recommend pt to see my colleague [name]. Appointment is arranged for tomorrow. Pt agrees with the plan. [name] has agreed to take him.   Mark Frey  04/05/2018

## 2018-04-05 NOTE — Telephone Encounter (Signed)
Per [name], called bed placement and made a bed request for inpatient admission on Monday April 10, 2018 for 5 days chemotherapy. Spoke to [place] in bed placement. Also called inpatient oncology 6E and spoke to [facility] to make her aware of upcoming admission.

## 2018-04-06 ENCOUNTER — Telehealth: Payer: Self-pay | Admitting: Hematology

## 2018-04-06 ENCOUNTER — Inpatient Hospital Stay: Payer: BLUE CROSS/BLUE SHIELD

## 2018-04-06 ENCOUNTER — Inpatient Hospital Stay (HOSPITAL_BASED_OUTPATIENT_CLINIC_OR_DEPARTMENT_OTHER): Payer: BLUE CROSS/BLUE SHIELD | Admitting: Hematology

## 2018-04-06 ENCOUNTER — Encounter: Payer: Self-pay | Admitting: Hematology

## 2018-04-06 ENCOUNTER — Other Ambulatory Visit: Payer: Self-pay

## 2018-04-06 ENCOUNTER — Ambulatory Visit: Payer: BLUE CROSS/BLUE SHIELD | Admitting: Nurse Practitioner

## 2018-04-06 VITALS — BP 130/76 | HR 108 | Temp 98.4°F | Resp 18 | Ht 69.0 in | Wt 237.3 lb

## 2018-04-06 DIAGNOSIS — I82491 Acute embolism and thrombosis of other specified deep vein of right lower extremity: Secondary | ICD-10-CM

## 2018-04-06 DIAGNOSIS — Z95828 Presence of other vascular implants and grafts: Secondary | ICD-10-CM

## 2018-04-06 DIAGNOSIS — Z79899 Other long term (current) drug therapy: Secondary | ICD-10-CM

## 2018-04-06 DIAGNOSIS — C8510 Unspecified B-cell lymphoma, unspecified site: Secondary | ICD-10-CM

## 2018-04-06 DIAGNOSIS — R1903 Right lower quadrant abdominal swelling, mass and lump: Secondary | ICD-10-CM

## 2018-04-06 DIAGNOSIS — D500 Iron deficiency anemia secondary to blood loss (chronic): Secondary | ICD-10-CM

## 2018-04-06 DIAGNOSIS — K922 Gastrointestinal hemorrhage, unspecified: Secondary | ICD-10-CM | POA: Diagnosis not present

## 2018-04-06 DIAGNOSIS — D709 Neutropenia, unspecified: Secondary | ICD-10-CM | POA: Diagnosis not present

## 2018-04-06 DIAGNOSIS — I2699 Other pulmonary embolism without acute cor pulmonale: Secondary | ICD-10-CM

## 2018-04-06 DIAGNOSIS — C8373 Burkitt lymphoma, intra-abdominal lymph nodes: Secondary | ICD-10-CM | POA: Diagnosis not present

## 2018-04-06 DIAGNOSIS — E119 Type 2 diabetes mellitus without complications: Secondary | ICD-10-CM | POA: Diagnosis not present

## 2018-04-06 DIAGNOSIS — E785 Hyperlipidemia, unspecified: Secondary | ICD-10-CM | POA: Diagnosis not present

## 2018-04-06 DIAGNOSIS — D696 Thrombocytopenia, unspecified: Secondary | ICD-10-CM

## 2018-04-06 DIAGNOSIS — C8378 Burkitt lymphoma, lymph nodes of multiple sites: Secondary | ICD-10-CM

## 2018-04-06 DIAGNOSIS — T451X5A Adverse effect of antineoplastic and immunosuppressive drugs, initial encounter: Secondary | ICD-10-CM | POA: Diagnosis not present

## 2018-04-06 LAB — CMP (CANCER CENTER ONLY)
ALT: 26 U/L (ref 0–44)
AST: 15 U/L (ref 15–41)
Albumin: 3.1 g/dL — ABNORMAL LOW (ref 3.5–5.0)
Alkaline Phosphatase: 93 U/L (ref 38–126)
Anion gap: 10 (ref 5–15)
BUN: 10 mg/dL (ref 8–23)
CHLORIDE: 105 mmol/L (ref 98–111)
CO2: 25 mmol/L (ref 22–32)
CREATININE: 0.76 mg/dL (ref 0.61–1.24)
Calcium: 9.7 mg/dL (ref 8.9–10.3)
GFR, Est AFR Am: >60 mL/min (ref >60)
GFR, Estimated: >60 mL/min (ref >60)
GLUCOSE: 162 mg/dL — AB (ref 70–99)
Potassium: 4 mmol/L (ref 3.5–5.1)
Sodium: 140 mmol/L (ref 135–145)
Total Bilirubin: <0.2 mg/dL — ABNORMAL LOW (ref 0.3–1.2)
Total Protein: 6.2 g/dL — ABNORMAL LOW (ref 6.5–8.1)

## 2018-04-06 LAB — RETICULOCYTES
RBC.: 3.67 MIL/uL — ABNORMAL LOW (ref 4.20–5.82)
RETIC COUNT ABSOLUTE: 91.8 10*3/uL (ref 34.8–93.9)
Retic Ct Pct: 2.5 % — ABNORMAL HIGH (ref 0.8–1.8)

## 2018-04-06 LAB — CBC WITH DIFFERENTIAL (CANCER CENTER ONLY)
Basophils Absolute: 0.1 10*3/uL (ref 0.0–0.1)
Basophils Relative: 1 %
Eosinophils Absolute: 0 10*3/uL (ref 0.0–0.5)
Eosinophils Relative: 0 %
HCT: 31.4 % — ABNORMAL LOW (ref 38.4–49.9)
Hemoglobin: 10.5 g/dL — ABNORMAL LOW (ref 13.0–17.1)
LYMPHS PCT: 6 %
Lymphs Abs: 0.9 10*3/uL (ref 0.9–3.3)
MCH: 28.8 pg (ref 27.2–33.4)
MCHC: 33.5 g/dL (ref 32.0–36.0)
MCV: 86 fL (ref 79.3–98.0)
MONOS PCT: 10 %
Monocytes Absolute: 1.6 10*3/uL — ABNORMAL HIGH (ref 0.1–0.9)
Neutro Abs: 12.7 10*3/uL — ABNORMAL HIGH (ref 1.5–6.5)
Neutrophils Relative %: 83 %
Platelet Count: 640 10*3/uL — ABNORMAL HIGH (ref 140–400)
RBC: 3.65 MIL/uL — ABNORMAL LOW (ref 4.20–5.82)
RDW: 17.8 % — ABNORMAL HIGH (ref 11.0–14.6)
WBC Count: 15.3 10*3/uL — ABNORMAL HIGH (ref 4.0–10.3)

## 2018-04-06 LAB — SAMPLE TO BLOOD BANK

## 2018-04-06 LAB — LACTATE DEHYDROGENASE: LDH: 251 U/L — AB (ref 98–192)

## 2018-04-06 LAB — URIC ACID: Uric Acid, Serum: 3 mg/dL — ABNORMAL LOW (ref 3.7–8.6)

## 2018-04-06 MED ORDER — HEPARIN SOD (PORK) LOCK FLUSH 100 UNIT/ML IV SOLN
500.0000 [IU] | Freq: Once | INTRAVENOUS | Status: AC
  Administered 2018-04-06: 500 [IU]
  Filled 2018-04-06: qty 5

## 2018-04-06 MED ORDER — SODIUM CHLORIDE 0.9% FLUSH
10.0000 mL | Freq: Once | INTRAVENOUS | Status: AC
  Administered 2018-04-06: 10 mL
  Filled 2018-04-06: qty 10

## 2018-04-06 NOTE — Telephone Encounter (Signed)
Scheduled appt per 7/25 los - gave patient aVS and calender per los. Central radiology to contact patient with PET scan . All appts with [facility] and YF cancelled - per [facility] move rituxan scheduled on 7/29 out one week .

## 2018-04-07 ENCOUNTER — Inpatient Hospital Stay: Payer: BLUE CROSS/BLUE SHIELD

## 2018-04-10 ENCOUNTER — Other Ambulatory Visit: Payer: Self-pay

## 2018-04-10 ENCOUNTER — Inpatient Hospital Stay: Payer: BLUE CROSS/BLUE SHIELD

## 2018-04-10 ENCOUNTER — Inpatient Hospital Stay (HOSPITAL_COMMUNITY)
Admission: RE | Admit: 2018-04-10 | Discharge: 2018-04-14 | DRG: 846 | Disposition: A | Discharge status: Home / Self Care | Payer: BLUE CROSS/BLUE SHIELD | Source: Ambulatory Visit | Attending: Hematology | Admitting: Hematology

## 2018-04-10 DIAGNOSIS — K5900 Constipation, unspecified: Secondary | ICD-10-CM | POA: Diagnosis not present

## 2018-04-10 DIAGNOSIS — C8378 Burkitt lymphoma, lymph nodes of multiple sites: Secondary | ICD-10-CM | POA: Diagnosis present

## 2018-04-10 DIAGNOSIS — Z881 Allergy status to other antibiotic agents status: Secondary | ICD-10-CM

## 2018-04-10 DIAGNOSIS — Z5111 Encounter for antineoplastic chemotherapy: Principal | ICD-10-CM

## 2018-04-10 DIAGNOSIS — I82401 Acute embolism and thrombosis of unspecified deep veins of right lower extremity: Secondary | ICD-10-CM | POA: Diagnosis not present

## 2018-04-10 DIAGNOSIS — Z87730 Personal history of (corrected) cleft lip and palate: Secondary | ICD-10-CM | POA: Diagnosis not present

## 2018-04-10 DIAGNOSIS — E119 Type 2 diabetes mellitus without complications: Secondary | ICD-10-CM | POA: Diagnosis not present

## 2018-04-10 DIAGNOSIS — C8370 Burkitt lymphoma, unspecified site: Secondary | ICD-10-CM | POA: Diagnosis present

## 2018-04-10 DIAGNOSIS — Z95828 Presence of other vascular implants and grafts: Secondary | ICD-10-CM | POA: Diagnosis not present

## 2018-04-10 DIAGNOSIS — I2699 Other pulmonary embolism without acute cor pulmonale: Secondary | ICD-10-CM | POA: Diagnosis not present

## 2018-04-10 DIAGNOSIS — Z7901 Long term (current) use of anticoagulants: Secondary | ICD-10-CM | POA: Diagnosis not present

## 2018-04-10 DIAGNOSIS — K922 Gastrointestinal hemorrhage, unspecified: Secondary | ICD-10-CM

## 2018-04-10 DIAGNOSIS — R402140 Coma scale, eyes open, spontaneous, unspecified time: Secondary | ICD-10-CM | POA: Diagnosis not present

## 2018-04-10 DIAGNOSIS — I82491 Acute embolism and thrombosis of other specified deep vein of right lower extremity: Secondary | ICD-10-CM | POA: Diagnosis not present

## 2018-04-10 DIAGNOSIS — I82411 Acute embolism and thrombosis of right femoral vein: Secondary | ICD-10-CM

## 2018-04-10 DIAGNOSIS — C8510 Unspecified B-cell lymphoma, unspecified site: Secondary | ICD-10-CM

## 2018-04-10 DIAGNOSIS — R402360 Coma scale, best motor response, obeys commands, unspecified time: Secondary | ICD-10-CM | POA: Diagnosis not present

## 2018-04-10 DIAGNOSIS — R402250 Coma scale, best verbal response, oriented, unspecified time: Secondary | ICD-10-CM | POA: Diagnosis present

## 2018-04-10 LAB — CBC WITH DIFFERENTIAL/PLATELET
BASOS PCT: 1 %
Basophils Absolute: 0.1 10*3/uL (ref 0.0–0.1)
EOS ABS: 0 10*3/uL (ref 0.0–0.7)
EOS PCT: 0 %
HCT: 35.2 % — ABNORMAL LOW (ref 39.0–52.0)
Hemoglobin: 11.1 g/dL — ABNORMAL LOW (ref 13.0–17.0)
LYMPHS ABS: 0.5 10*3/uL — AB (ref 0.7–4.0)
LYMPHS PCT: 5 %
MCH: 28.4 pg (ref 26.0–34.0)
MCHC: 31.5 g/dL (ref 30.0–36.0)
MCV: 90 fL (ref 78.0–100.0)
Monocytes Absolute: 0.1 10*3/uL (ref 0.1–1.0)
Monocytes Relative: 1 %
Neutro Abs: 9.3 10*3/uL — ABNORMAL HIGH (ref 1.7–7.7)
Neutrophils Relative %: 93 %
Platelets: 588 10*3/uL — ABNORMAL HIGH (ref 150–400)
RBC: 3.91 MIL/uL — ABNORMAL LOW (ref 4.22–5.81)
RDW: 17.7 % — AB (ref 11.5–15.5)
WBC: 10 10*3/uL (ref 4.0–10.5)

## 2018-04-10 LAB — COMPREHENSIVE METABOLIC PANEL
ALBUMIN: 3.5 g/dL (ref 3.5–5.0)
ALT: 20 U/L (ref 0–44)
AST: 18 U/L (ref 15–41)
Alkaline Phosphatase: 57 U/L (ref 38–126)
Anion gap: 7 (ref 5–15)
BUN: 14 mg/dL (ref 8–23)
CHLORIDE: 103 mmol/L (ref 98–111)
CO2: 25 mmol/L (ref 22–32)
Calcium: 9.7 mg/dL (ref 8.9–10.3)
Creatinine, Ser: 0.93 mg/dL (ref 0.61–1.24)
GFR calc Af Amer: >60 mL/min (ref >60)
Glucose, Bld: 224 mg/dL — ABNORMAL HIGH (ref 70–99)
POTASSIUM: 4.7 mmol/L (ref 3.5–5.1)
SODIUM: 135 mmol/L (ref 135–145)
Total Bilirubin: 0.5 mg/dL (ref 0.3–1.2)
Total Protein: 6.8 g/dL (ref 6.5–8.1)

## 2018-04-10 LAB — URIC ACID: Uric Acid, Serum: 3.5 mg/dL — ABNORMAL LOW (ref 3.7–8.6)

## 2018-04-10 LAB — PROTIME-INR
INR: 1
PROTHROMBIN TIME: 13.1 s (ref 11.4–15.2)

## 2018-04-10 LAB — MAGNESIUM: Magnesium: 1.6 mg/dL — ABNORMAL LOW (ref 1.7–2.4)

## 2018-04-10 LAB — PHOSPHORUS: PHOSPHORUS: 3 mg/dL (ref 2.5–4.6)

## 2018-04-10 LAB — APTT: APTT: 28 s (ref 24–36)

## 2018-04-10 MED ORDER — SODIUM CHLORIDE 0.9% FLUSH: 3.0000 mL | INTRAVENOUS | Status: DC | PRN

## 2018-04-10 MED ORDER — SODIUM CHLORIDE 0.9 % IV SOLN
Freq: Once | INTRAVENOUS | Status: AC
  Administered 2018-04-10: 18 mg via INTRAVENOUS
  Filled 2018-04-10: qty 4

## 2018-04-10 MED ORDER — PREDNISONE 5 MG PO TABS
30.0000 mg | ORAL_TABLET | Freq: Two times a day (BID) | ORAL | Status: DC
  Administered 2018-04-10 – 2018-04-14 (×8): 30 mg via ORAL
  Filled 2018-04-10 (×8): qty 1

## 2018-04-10 MED ORDER — PREDNISONE 5 MG PO TABS
30.0000 mg | ORAL_TABLET | Freq: Once | ORAL | Status: AC
  Administered 2018-04-10: 30 mg via ORAL
  Filled 2018-04-10: qty 1

## 2018-04-10 MED ORDER — SITAGLIPTIN PHOS-METFORMIN HCL 50-1000 MG PO TABS: 1.0000 | ORAL_TABLET | Freq: Two times a day (BID) | ORAL | Status: DC

## 2018-04-10 MED ORDER — HEPARIN SOD (PORK) LOCK FLUSH 100 UNIT/ML IV SOLN
250.0000 [IU] | Freq: Once | INTRAVENOUS | Status: DC | PRN
Start: 2018-04-10 — End: 2018-04-14

## 2018-04-10 MED ORDER — VINCRISTINE SULFATE CHEMO INJECTION 1 MG/ML
Freq: Once | INTRAVENOUS | Status: AC
  Administered 2018-04-10: 17:00:00 via INTRAVENOUS
  Filled 2018-04-10: qty 12

## 2018-04-10 MED ORDER — ALTEPLASE 2 MG IJ SOLR: 2.0000 mg | Freq: Once | INTRAMUSCULAR | Status: DC | PRN

## 2018-04-10 MED ORDER — ENOXAPARIN SODIUM 150 MG/ML ~~LOC~~ SOLN
150.0000 mg | SUBCUTANEOUS | Status: DC
  Administered 2018-04-10 – 2018-04-13 (×4): 150 mg via SUBCUTANEOUS
  Filled 2018-04-10 (×5): qty 1

## 2018-04-10 MED ORDER — METFORMIN HCL 500 MG PO TABS
1000.0000 mg | ORAL_TABLET | Freq: Two times a day (BID) | ORAL | Status: DC
  Administered 2018-04-10 – 2018-04-14 (×7): 1000 mg via ORAL
  Filled 2018-04-10 (×8): qty 2

## 2018-04-10 MED ORDER — HEPARIN SOD (PORK) LOCK FLUSH 100 UNIT/ML IV SOLN
500.0000 [IU] | Freq: Once | INTRAVENOUS | Status: DC | PRN
Start: 2018-04-10 — End: 2018-04-14
  Filled 2018-04-10: qty 5

## 2018-04-10 MED ORDER — SODIUM CHLORIDE 0.9% FLUSH: 10.0000 mL | INTRAVENOUS | Status: DC | PRN

## 2018-04-10 MED ORDER — ACETAMINOPHEN 325 MG PO TABS: 650.0000 mg | ORAL_TABLET | Freq: Four times a day (QID) | ORAL | Status: DC | PRN

## 2018-04-10 MED ORDER — LINAGLIPTIN 5 MG PO TABS
5.0000 mg | ORAL_TABLET | Freq: Every day | ORAL | Status: DC
  Administered 2018-04-10 – 2018-04-14 (×5): 5 mg via ORAL
  Filled 2018-04-10 (×5): qty 1

## 2018-04-10 MED ORDER — SODIUM CHLORIDE 0.9 % IV SOLN
INTRAVENOUS | Status: DC
  Administered 2018-04-10: 16:00:00 via INTRAVENOUS
  Administered 2018-04-12: 20 mL/h via INTRAVENOUS

## 2018-04-10 MED ORDER — LIDOCAINE-PRILOCAINE 2.5-2.5 % EX CREA
TOPICAL_CREAM | Freq: Once | CUTANEOUS | Status: AC
  Administered 2018-04-10: 14:00:00 via TOPICAL
  Filled 2018-04-10: qty 5

## 2018-04-10 MED ORDER — PREDNISONE 5 MG PO TABS: 30.0000 mg | ORAL_TABLET | Freq: Two times a day (BID) | ORAL | Status: DC

## 2018-04-10 MED ORDER — ALLOPURINOL 300 MG PO TABS
300.0000 mg | ORAL_TABLET | Freq: Every evening | ORAL | Status: DC
  Administered 2018-04-10 – 2018-04-13 (×4): 300 mg via ORAL
  Filled 2018-04-10 (×4): qty 1

## 2018-04-10 NOTE — H&P (Addendum)
HEMATOLOGY/ONCOLOGY INPATIENT H&P NOTE  Date of Service: 04/10/2018  Inpatient Attending: .[name], MD   SUBJECTIVE:   Mark Frey is here for his planned admission for C2 of [facility] for chromosomal variant Burkitts lymphoma. Labs stable with no overt TLS findings. Hgb stable and improving.   [name] is accompanied today by his wife at bedside. The pt notes that he is feeling very well overall and  has been eating well today. He denies any abdominal pains or problems moving his bowels. He has not had any blood in the stools or black stools.  The pt does not that three days ago he had some numnbess in his feet which was very mild and dissipated fairly quickly, but will let me know if this returns or worsens. He has kept active and has been walking around each time he gets up to use the bathroom.   On review of systems, pt reports feeling well, eating well, regularly moving his bowels, staying reasonably active, and denies headaches, changes in vision, abdominal pains, SOB, chest pain, problems passing urine, blood in the stools, black stools, testicular pain or swelling, tingling or numbness in his hands or feet, and any other symptoms.    OBJECTIVE:  NAD  PHYSICAL EXAMINATION: . Vitals:   04/10/18 1001  BP: (!) 125/96  Pulse: (!) 109  Resp: 17  Temp: 98.6 F (37 C)  TempSrc: Oral  SpO2: 97%   There were no vitals filed for this visit. .There is no height or weight on file to calculate BMI.  GENERAL:alert, in no acute distress and comfortable SKIN: skin color, texture, turgor are normal, no rashes or significant lesions EYES: normal, conjunctiva are pink and non-injected, sclera clear OROPHARYNX:no exudate, no erythema and lips, buccal mucosa, and tongue normal  NECK: supple, no JVD, thyroid normal size, non-tender, without nodularity LYMPH:  no palpable lymphadenopathy in the cervical, axillary or inguinal LUNGS: clear to auscultation with normal  respiratory effort HEART: regular rate & rhythm,  no murmurs and no lower extremity edema ABDOMEN: abdomen soft, non-tender, normoactive bowel sounds  Extremity - grade 1 pedal edema b/l R>L PSYCH: alert & oriented x 3 with fluent speech NEURO: no focal motor/sensory deficits  MEDICAL HISTORY:  Past Medical History:  Diagnosis Date  . ALLERGIC RHINITIS   . Diabetes mellitus   . Hyperlipidemia     SURGICAL HISTORY: Past Surgical History:  Procedure Laterality Date  . BIOPSY  03/15/2018   Procedure: BIOPSY;  Surgeon: Milus Banister, MD;  Location: [facility];  Service: Endoscopy;;  . CLEFT PALATE REPAIR    . COLONOSCOPY N/A 03/15/2018   Procedure: COLONOSCOPY;  Surgeon: Milus Banister, MD;  Location: [facility];  Service: Endoscopy;  Laterality: N/A;  . deviated septum repair     slight improvement  . ESOPHAGOGASTRODUODENOSCOPY N/A 03/15/2018   Procedure: ESOPHAGOGASTRODUODENOSCOPY (EGD);  Surgeon: Milus Banister, MD;  Location: [name];  Service: Endoscopy;  Laterality: N/A;  . IR IMAGING GUIDED PORT INSERTION  03/17/2018  . TONSILLECTOMY      SOCIAL HISTORY: Social History   Socioeconomic History  . Marital status: Married    Spouse name: Not on file  . Number of children: Not on file  . Years of education: Not on file  . Highest education level: Not on file  Occupational History  . Not on file  Social Needs  . Financial resource strain: Not on file  . Food insecurity:    Worry: Not on  file    Inability: Not on file  . Transportation needs:    Medical: Not on file    Non-medical: Not on file  Tobacco Use  . Smoking status: Never Smoker  . Smokeless tobacco: Never Used  Substance and Sexual Activity  . Alcohol use: Yes    Comment: occasional  . Drug use: No  . Sexual activity: Yes  Lifestyle  . Physical activity:    Days per week: Not on file    Minutes per session: Not on file  . Stress: Not on file  Relationships  . Social connections:    Talks  on phone: Not on file    Gets together: Not on file    Attends religious service: Not on file    Active member of club or organization: Not on file    Attends meetings of clubs or organizations: Not on file    Relationship status: Not on file  . Intimate partner violence:    Fear of current or ex partner: Not on file    Emotionally abused: Not on file    Physically abused: Not on file    Forced sexual activity: Not on file  Other Topics Concern  . Not on file  Social History Narrative   Married 1985. No kids. 4 small dogs.       Works in Financial trader, residential      Hobbies: work on cars, Haematologist, exercise as able    FAMILY HISTORY: Family History  Problem Relation Age of Onset  . Lung cancer Mother        smoker  . Brain cancer Mother        metastasis  . AAA (abdominal aortic aneurysm) Father        smoker    ALLERGIES:  is allergic to ciprofloxacin.  MEDICATIONS:  Scheduled Meds: . allopurinol  300 mg Oral QPM  . DOXOrubicin/vinCRIStine/etoposide CHEMO IV infusion for Inpatient CI   Intravenous Once  . enoxaparin  150 mg Subcutaneous Q24H  . linagliptin  5 mg Oral Q breakfast  . metFORMIN  1,000 mg Oral BID WC  . predniSONE  30 mg Oral BID WC   Continuous Infusions: PRN Meds:.  REVIEW OF SYSTEMS:    10 Point review of Systems was done is negative except as noted above.   LABORATORY DATA:  I have reviewed the data as listed  . CBC Latest Ref Rng & Units 04/10/2018 04/06/2018  WBC 4.0 - 10.5 K/uL 10.0 15.3(H)  Hemoglobin 13.0 - 17.0 g/dL 11.1(L) 10.5(L)  Hematocrit 39.0 - 52.0 % 35.2(L) 31.4(L)  Platelets 150 - 400 K/uL 588(H) 640(H)    . CMP Latest Ref Rng & Units 04/10/2018 04/06/2018  Glucose 70 - 99 mg/dL 224(H) 162(H)  BUN 8 - 23 mg/dL 14 10  Creatinine 0.61 - 1.24 mg/dL 0.93 0.76  Sodium 135 - 145 mmol/L 135 140  Potassium 3.5 - 5.1 mmol/L 4.7 4.0  Chloride 98 - 111 mmol/L 103 105  CO2 22 - 32 mmol/L 25 25  Calcium 8.9 - 10.3  mg/dL 9.7 9.7  Total Protein 6.5 - 8.1 g/dL 6.8 6.2(L)  Total Bilirubin 0.3 - 1.2 mg/dL 0.5 <0.2(L)  Alkaline Phos 38 - 126 U/L 57 93  AST 15 - 41 U/L 18 15  ALT 0 - 44 U/L 20 26     RADIOGRAPHIC STUDIES: I have personally reviewed the radiological images as listed and agreed with the findings in the report. US Scrotum  Result Date:  03/15/2018 CLINICAL DATA:  Scrotal swelling EXAM: SCROTAL ULTRASOUND DOPPLER ULTRASOUND OF THE TESTICLES TECHNIQUE: Complete ultrasound examination of the testicles, epididymis, and other scrotal structures was performed. Color and spectral Doppler ultrasound were also utilized to evaluate blood flow to the testicles. COMPARISON:  CT 03/10/2018 FINDINGS: Right testicle Measurements: 4.2 x 2.5 x 2.6 cm. No mass or microlithiasis visualized. Left testicle Measurements: 4.2 x 2.5 x 2.7 cm. No mass or microlithiasis visualized. Right epididymis:  Cyst measuring 9 x 8 x 11 mm Left epididymis:  Cyst measuring 6 x 4 mm Hydrocele:  Small left hydrocele.  Moderate right hydrocele. Varicocele:  Small right varicocele. Significant scrotal skin thickening with diffuse edema in the soft tissues Pulsed Doppler interrogation of both testes demonstrates normal low resistance arterial and venous waveforms bilaterally. IMPRESSION: 1. Negative for testicular torsion or intratesticular mass 2. Bilateral hydroceles, small on the left and moderate on the right 3. Small right varicocele 4. Extensive scrotal edema and skin thickening Electronically Signed   By: Donavan Foil M.D.   On: 03/15/2018 20:19   Ct Biopsy  Result Date: 03/17/2018 INDICATION: Recent diagnosis of lymphoma. Please perform CT-guided bone marrow biopsy for tissue diagnostic purposes. EXAM: CT-GUIDED BONE MARROW BIOPSY AND ASPIRATION MEDICATIONS: None ANESTHESIA/SEDATION: Fentanyl 100 mcg IV; Versed 2 mg IV Sedation Time: 10 Minutes; The patient was continuously monitored during the procedure by the interventional radiology  nurse under my direct supervision. COMPLICATIONS: None immediate. PROCEDURE: Informed consent was obtained from the patient following an explanation of the procedure, risks, benefits and alternatives. The patient understands, agrees and consents for the procedure. All questions were addressed. A time out was performed prior to the initiation of the procedure. The patient was positioned left lateral decubitus and non-contrast localization CT was performed of the pelvis to demonstrate the iliac marrow spaces. The operative site was prepped and draped in the usual sterile fashion. Under sterile conditions and local anesthesia, a 22 gauge spinal needle was utilized for procedural planning. Next, an 11 gauge coaxial bone biopsy needle was advanced into the left iliac marrow space. Needle position was confirmed with CT imaging. Initially, bone marrow aspiration was performed. Next, a bone marrow biopsy was obtained with the 11 gauge outer bone marrow device. Samples were prepared with the cytotechnologist and deemed adequate. The needle was removed intact. Hemostasis was obtained with compression and a dressing was placed. The patient tolerated the procedure well without immediate post procedural complication. IMPRESSION: Successful CT guided left iliac bone marrow aspiration and core biopsy. Electronically Signed   By: Sandi Mariscal M.D.   On: 03/17/2018 10:41   Ct Biopsy  Result Date: 03/13/2018 CLINICAL DATA:  Large infiltrative masses of the peritoneal cavity. EXAM: CT GUIDED CORE BIOPSY OF PERITONEAL MASS ANESTHESIA/SEDATION: 2.0 mg IV Versed; 100 mcg IV Fentanyl Total Moderate Sedation Time:  17 minutes. The patient's level of consciousness and physiologic status were continuously monitored during the procedure by Radiology nursing. PROCEDURE: The procedure risks, benefits, and alternatives were explained to the patient. Questions regarding the procedure were encouraged and answered. The patient understands and  consents to the procedure. A time-out was performed prior to initiating the procedure. The operative field was prepped with chlorhexidine in a sterile fashion, and a sterile drape was applied covering the operative field. A sterile gown and sterile gloves were used for the procedure. Local anesthesia was provided with 1% Lidocaine. Under CT guidance, a 17 gauge trocar needle was advanced into the right lower peritoneal cavity. Five separate  coaxial 18 gauge core biopsy samples were obtained and submitted in both saline and formalin. Additional CT imaging was performed after outer needle removal. COMPLICATIONS: None FINDINGS: Right lower quadrant portion bulky peritoneal tumor was chosen for sampling. Solid tumor was obtained. There were no immediate complications. IMPRESSION: CT-guided core biopsy performed of bulky tumor in the right lower peritoneal cavity. Electronically Signed   By: Aletta Edouard M.D.   On: 03/13/2018 17:36   Ct Bone Marrow Biopsy  Result Date: 03/17/2018 INDICATION: Recent diagnosis of lymphoma. Please perform CT-guided bone marrow biopsy for tissue diagnostic purposes. EXAM: CT-GUIDED BONE MARROW BIOPSY AND ASPIRATION MEDICATIONS: None ANESTHESIA/SEDATION: Fentanyl 100 mcg IV; Versed 2 mg IV Sedation Time: 10 Minutes; The patient was continuously monitored during the procedure by the interventional radiology nurse under my direct supervision. COMPLICATIONS: None immediate. PROCEDURE: Informed consent was obtained from the patient following an explanation of the procedure, risks, benefits and alternatives. The patient understands, agrees and consents for the procedure. All questions were addressed. A time out was performed prior to the initiation of the procedure. The patient was positioned left lateral decubitus and non-contrast localization CT was performed of the pelvis to demonstrate the iliac marrow spaces. The operative site was prepped and draped in the usual sterile fashion. Under  sterile conditions and local anesthesia, a 22 gauge spinal needle was utilized for procedural planning. Next, an 11 gauge coaxial bone biopsy needle was advanced into the left iliac marrow space. Needle position was confirmed with CT imaging. Initially, bone marrow aspiration was performed. Next, a bone marrow biopsy was obtained with the 11 gauge outer bone marrow device. Samples were prepared with the cytotechnologist and deemed adequate. The needle was removed intact. Hemostasis was obtained with compression and a dressing was placed. The patient tolerated the procedure well without immediate post procedural complication. IMPRESSION: Successful CT guided left iliac bone marrow aspiration and core biopsy. Electronically Signed   By: Sandi Mariscal M.D.   On: 03/17/2018 10:41   Dg Fluoro Guided Loc Of Needle/cath Tip For Spinal Inject Rt  Result Date: 03/21/2018 CLINICAL DATA:  62 year old male with history of high-grade B-cell lymphoma. EXAM: FLUOROSCOPICALLY GUIDED LUMBAR PUNCTURE FOR INTRATHECAL CHEMOTHERAPY TECHNIQUE: Informed consent was obtained from the patient prior to the procedure, including potential complications of headache, allergy, and pain. A 'time out' was performed. With the patient prone, the lower back was prepped with Betadine. 1% Lidocaine was used for local anesthesia. Lumbar puncture was performed at the L2-L3 level using a 20 gauge needle with return of clear CSF. A total of 8 mL of CSF was collected into sterile tubing and sent to the laboratory for testing and analysis per orders of the primary medical team. Subsequently, 5 mL of methotrexate was injected into the subarachnoid space. The patient tolerated the procedure well without apparent complication. FLUOROSCOPY TIME:  48 seconds IMPRESSION: 1. Intrathecal injection of chemotherapy without complication. Electronically Signed   By: Vinnie Langton M.D.   On: 03/21/2018 14:39   US Scrotum Doppler  Result Date: 03/15/2018 CLINICAL  DATA:  Scrotal swelling EXAM: SCROTAL ULTRASOUND DOPPLER ULTRASOUND OF THE TESTICLES TECHNIQUE: Complete ultrasound examination of the testicles, epididymis, and other scrotal structures was performed. Color and spectral Doppler ultrasound were also utilized to evaluate blood flow to the testicles. COMPARISON:  CT 03/10/2018 FINDINGS: Right testicle Measurements: 4.2 x 2.5 x 2.6 cm. No mass or microlithiasis visualized. Left testicle Measurements: 4.2 x 2.5 x 2.7 cm. No mass or microlithiasis visualized. Right epididymis:  Cyst measuring 9 x 8 x 11 mm Left epididymis:  Cyst measuring 6 x 4 mm Hydrocele:  Small left hydrocele.  Moderate right hydrocele. Varicocele:  Small right varicocele. Significant scrotal skin thickening with diffuse edema in the soft tissues Pulsed Doppler interrogation of both testes demonstrates normal low resistance arterial and venous waveforms bilaterally. IMPRESSION: 1. Negative for testicular torsion or intratesticular mass 2. Bilateral hydroceles, small on the left and moderate on the right 3. Small right varicocele 4. Extensive scrotal edema and skin thickening Electronically Signed   By: Donavan Foil M.D.   On: 03/15/2018 20:19   Ct Venogram Abd/pel  Result Date: 03/23/2018 CLINICAL DATA:  Right lower extremity DVT EXAM: CT VENOGRAM ABD-PELVIS TECHNIQUE: Multidetector CT imaging of the abdomen and pelvis was performed using the standard protocol during bolus administration of intravenous contrast. Multiplanar reconstructed images and MIPs were obtained and reviewed to evaluate the venous anatomy. CONTRAST:  170m ISOVUE-300 IOPAMIDOL (ISOVUE-300) INJECTION 61% COMPARISON:  03/10/2018 FINDINGS: VASCULAR Veins: Hepatic veins, portal vein, and splenic vein are patent. Superior mesenteric vein is patent. IVC is patent.  Renal veins are patent. Left common and external iliac veins are patent. The right common and external iliac veins are patent. There is compression of the right common  and external iliac veins secondary to residual retroperitoneal hemorrhage and/or infiltrative mass. There is low-density filling defect in the right common femoral vein compatible with DVT. The right femoral vein is grossly patent. No evidence of left common femoral or femoral DVT. Review of the MIP images confirms the above findings. NON-VASCULAR Lower chest: Bibasilar atelectasis for scarring. Hepatobiliary: Liver is unremarkable.  Gallbladder is decompressed. Pancreas: Unremarkable Spleen: Unremarkable Adrenals/Urinary Tract: Nonspecific hypodensity in the mid right kidney. Left kidney is within normal limits. Right hydronephrosis has nearly resolved. Small calculus in the posterior lower pole of the right kidney is unchanged. Stomach/Bowel: Severe wall thickening of a loop of bowel in the right lower quadrant either represents the terminal ileum or cecum. Wall thickening is very nodular and this is worrisome for adenocarcinoma or lymphoma. There is no evidence of small-bowel obstruction. The appendix is not readily visualized. There is no definite extraluminal bowel gas to suggest perforation. Stomach and duodenum are unremarkable. Lymphatic: There is no abnormal retroperitoneal adenopathy. Mild atherosclerotic calcification of the aorta without aneurysm. Reproductive: Prostate is unremarkable other than calcifications. Other: On the prior study, there was ill-defined and infiltrative soft tissue density throughout the right lower quadrant of the abdomen and extending into the right iliopsoas musculature and right inguinal region. This soft tissue density has largely resolved with some residual stranding throughout the retroperitoneum. The large infiltrative mass is now simply replaced by stranding and ill-defined bowel loops. Expansion of the right iliopsoas musculature has also improved. There is stranding throughout the musculature of the proximal right thigh which is stable. Due to the dramatic improvement  of the infiltrative mass, this may represent a dramatic response to chemotherapy if this correlates with the given history. Alternatively, the ill-defined mass may represent hemorrhage which has evolved. Musculoskeletal: No vertebral compression deformity. IMPRESSION: VASCULAR There is no evidence of DVT in the abdomen or pelvis. Specifically, there is no evidence of right common or external iliac vein DVT. The study is positive for DVT in the right common femoral vein. NON-VASCULAR The large infiltrative mass in the right lower quadrant has largely improved. The dramatic improvement either represents a rapid response to chemotherapy or possibly evolving hemorrhage. Correlate clinically with vital signs and  hemoglobin levels. The right hydronephrosis has also nearly resolved. There is persistent compression of the right common and external iliac veins due to residual retroperitoneal hemorrhage. Circumferential mass in the terminal ileum or cecum worrisome for adenocarcinoma or lymphoma. It is conceivable that the wall thickening may be related to hemorrhage in the wall of bowel, but it is rather nodular suggesting neoplasm. Electronically Signed   By: Marybelle Killings M.D.   On: 03/23/2018 08:17   Ir Imaging Guided Port Insertion  Result Date: 03/17/2018 INDICATION: Recent diagnosis of lymphoma. In need of durable intravenous access for chemotherapy administration. EXAM: IMPLANTED PORT A CATH PLACEMENT WITH ULTRASOUND AND FLUOROSCOPIC GUIDANCE COMPARISON:  CT the chest, abdomen and pelvis-03/10/2018 MEDICATIONS: Ancef 2 gm IV; The antibiotic was administered within an appropriate time interval prior to skin puncture. ANESTHESIA/SEDATION: Moderate (conscious) sedation was employed during this procedure. A total of Versed 0.5 mg and Fentanyl 25 mcg was administered intravenously. Moderate Sedation Time: 26 minutes. The patient's level of consciousness and vital signs were monitored continuously by radiology nursing  throughout the procedure under my direct supervision. CONTRAST:  None FLUOROSCOPY TIME:  12 seconds (5 mGy) COMPLICATIONS: None immediate. PROCEDURE: The procedure, risks, benefits, and alternatives were explained to the patient. Questions regarding the procedure were encouraged and answered. The patient understands and consents to the procedure. The right neck and chest were prepped with chlorhexidine in a sterile fashion, and a sterile drape was applied covering the operative field. Maximum barrier sterile technique with sterile gowns and gloves were used for the procedure. A timeout was performed prior to the initiation of the procedure. Local anesthesia was provided with 1% lidocaine with epinephrine. After creating a small venotomy incision, a micropuncture kit was utilized to access the internal jugular vein. Real-time ultrasound guidance was utilized for vascular access including the acquisition of a permanent ultrasound image documenting patency of the accessed vessel. The microwire was utilized to measure appropriate catheter length. A subcutaneous port pocket was then created along the upper chest wall utilizing a combination of sharp and blunt dissection. The pocket was irrigated with sterile saline. A single lumen ISP power injectable port was chosen for placement. The 8 Fr catheter was tunneled from the port pocket site to the venotomy incision. The port was placed in the pocket. The external catheter was trimmed to appropriate length. At the venotomy, an 8 Fr peel-away sheath was placed over a guidewire under fluoroscopic guidance. The catheter was then placed through the sheath and the sheath was removed. Final catheter positioning was confirmed and documented with a fluoroscopic spot radiograph. The port was accessed with a Huber needle and was noted to easily aspirate and flush. The Port a Catheter was left accessed for the initiation of chemotherapy later today. The venotomy site was closed with an  interrupted 4-0 Vicryl suture. The port pocket incision was closed with interrupted 2-0 Vicryl suture and the skin was opposed with a running subcuticular 4-0 Vicryl suture. Dermabond and Steri-strips were applied to both incisions. Dressings were placed. The patient tolerated the procedure well without immediate post procedural complication. FINDINGS: After catheter placement, the tip lies within the superior cavoatrial junction. The catheter aspirates and flushes normally and is ready for immediate use. IMPRESSION: Successful placement of a right internal jugular approach power injectable Port-A-Cath. The catheter is ready for immediate use. Electronically Signed   By: Sandi Mariscal M.D.   On: 03/17/2018 10:42    ASSESSMENT & PLAN:   62 y.o. male with  1. Burkitt ymphoma stage IIAE  bulky disease 03/10/18 CT A/P revealed Large irregular infiltrative solid mass in the right lower quadrant measuring up to 18.7 x 18.5 x 17.8 cm, infiltrating and encasing multiple distal small bowel loops and likely the ileocecal region, partially encasing the sigmoid colon, with prominent extension into the right lower retroperitoneum and extraperitoneal right pelvis with encasement of right external iliac and proximal right common iliac vasculature and infiltration of the right iliopsoas muscle  PLAN: -labs stable -no prohbitive toxicities from EPOCH-R cycle 1  -The pt has no prohibitive toxicities from continuing C2 of EPOCH at this time.   -neulasta as outpatient on 04/15/2018 -Rituxan as outpatient on 04/17/2018 -Will add intrathecal Methotrexate back from C3 to avoid interrupting anticoagulation. -Will discontinue Allopurinol after C2 if no evidence of TLS -Continue eating well, hydrating well and walking around as often as reasonably possible  2. RLE DVT and PE -continue Lovenox 118m [place] daily  3) GI bleeding related to bowel involvement by lymphoma. -no overt bleeding at this time -continue to monitor. hgb  stable.  4) DVT px -- on therapeutic lovenox   The total time spent in the appt was 70 minutes and more than 50% was on counseling and direct patient cares and co-ordinating care with nursing staff and pharmacist, entering and reviewing chemotherapy orders.    GSullivan LoneMD MS AAHIVMS SGreater Gaston [facility] LLCCMidmichigan Medical Center ALPenaHematology/Oncology Physician CSt. [facility] (Office):       [phone](Work cell):  [phone](Fax):           [phone] 04/10/2018 11:15 AM  I, SBaldwin Jamaica am acting as a scribe for Dr. KIrene Limbo .I have reviewed the above documentation for accuracy and completeness, and I agree with the above. GSullivan LoneMD MS

## 2018-04-11 ENCOUNTER — Ambulatory Visit: Payer: BLUE CROSS/BLUE SHIELD

## 2018-04-11 ENCOUNTER — Other Ambulatory Visit: Payer: BLUE CROSS/BLUE SHIELD

## 2018-04-11 ENCOUNTER — Encounter: Payer: BLUE CROSS/BLUE SHIELD | Admitting: Nutrition

## 2018-04-11 ENCOUNTER — Ambulatory Visit: Payer: BLUE CROSS/BLUE SHIELD | Admitting: Nurse Practitioner

## 2018-04-11 DIAGNOSIS — I2699 Other pulmonary embolism without acute cor pulmonale: Secondary | ICD-10-CM

## 2018-04-11 DIAGNOSIS — C8370 Burkitt lymphoma, unspecified site: Secondary | ICD-10-CM

## 2018-04-11 DIAGNOSIS — Z7901 Long term (current) use of anticoagulants: Secondary | ICD-10-CM

## 2018-04-11 DIAGNOSIS — I82491 Acute embolism and thrombosis of other specified deep vein of right lower extremity: Secondary | ICD-10-CM

## 2018-04-11 DIAGNOSIS — Z5111 Encounter for antineoplastic chemotherapy: Principal | ICD-10-CM

## 2018-04-11 DIAGNOSIS — K922 Gastrointestinal hemorrhage, unspecified: Secondary | ICD-10-CM

## 2018-04-11 LAB — CBC
HCT: 34.4 % — ABNORMAL LOW (ref 39.0–52.0)
Hemoglobin: 11.2 g/dL — ABNORMAL LOW (ref 13.0–17.0)
MCH: 28.5 pg (ref 26.0–34.0)
MCHC: 32.6 g/dL (ref 30.0–36.0)
MCV: 87.5 fL (ref 78.0–100.0)
PLATELETS: 646 10*3/uL — AB (ref 150–400)
RBC: 3.93 MIL/uL — ABNORMAL LOW (ref 4.22–5.81)
RDW: 17 % — AB (ref 11.5–15.5)
WBC: 9.7 10*3/uL (ref 4.0–10.5)

## 2018-04-11 LAB — COMPREHENSIVE METABOLIC PANEL
ALT: 19 U/L (ref 0–44)
ANION GAP: 8 (ref 5–15)
AST: 18 U/L (ref 15–41)
Albumin: 3.3 g/dL — ABNORMAL LOW (ref 3.5–5.0)
Alkaline Phosphatase: 48 U/L (ref 38–126)
BILIRUBIN TOTAL: 0.5 mg/dL (ref 0.3–1.2)
BUN: 15 mg/dL (ref 8–23)
CHLORIDE: 107 mmol/L (ref 98–111)
CO2: 25 mmol/L (ref 22–32)
Calcium: 9.8 mg/dL (ref 8.9–10.3)
Creatinine, Ser: 0.72 mg/dL (ref 0.61–1.24)
Glucose, Bld: 197 mg/dL — ABNORMAL HIGH (ref 70–99)
POTASSIUM: 4.7 mmol/L (ref 3.5–5.1)
Sodium: 140 mmol/L (ref 135–145)
TOTAL PROTEIN: 6.7 g/dL (ref 6.5–8.1)

## 2018-04-11 LAB — URIC ACID: URIC ACID, SERUM: 3.4 mg/dL — AB (ref 3.7–8.6)

## 2018-04-11 MED ORDER — VINCRISTINE SULFATE CHEMO INJECTION 1 MG/ML
Freq: Once | INTRAVENOUS | Status: AC
  Administered 2018-04-11: 16:00:00 via INTRAVENOUS
  Filled 2018-04-11: qty 12

## 2018-04-11 MED ORDER — ONDANSETRON HCL 40 MG/20ML IJ SOLN
Freq: Once | INTRAMUSCULAR | Status: AC
  Administered 2018-04-11: 8 mg via INTRAVENOUS
  Filled 2018-04-11: qty 4

## 2018-04-11 NOTE — Progress Notes (Signed)
HEMATOLOGY/ONCOLOGY INPATIENT PROGRESS NOTE  Date of Service: 04/11/2018  Inpatient Attending: .[name], MD   SUBJECTIVE:   Mark Frey reports that he is doing well overall and continues tolerating EPOCH well.   The pt reports that he has slept decently and will let me know if he needs medication to aide his sleep. He notes that he hasn't had a bowel movement today, but is eating well and denies any nausea or abdominal pain.  Lab results today (04/11/18) of CBC w/diff is as follows: all values are WNL except for RBC at 3.93, HGB at 11.2, HCT at 34.4, RDW at 17.0, PLT at 646k. Uric acid 04/11/18 at 3.4  On review of systems, pt reports eating well, stable energy levels, and denies abdominal pains, nausea, vomiting, and any other symptoms.   OBJECTIVE:  NAD PHYSICAL EXAMINATION: . Vitals:   04/10/18 1545 04/10/18 2049 04/11/18 0548 04/11/18 1340  BP: 132/80 133/77 127/85 113/74  Pulse: 100 (!) 102 97 (!) 108  Resp: 18 19 20 18   Temp: 98.5 F (36.9 C) 98 F (36.7 C) 97.9 F (36.6 C) 98.8 F (37.1 C)  TempSrc: Oral Oral Oral Oral  SpO2: 96% 95% 98% 96%   GENERAL:alert, in no acute distress and comfortable SKIN: skin color, texture, turgor are normal, no rashes or significant lesions EYES: normal, conjunctiva are pink and non-injected, sclera clear OROPHARYNX:no exudate, no erythema and lips, buccal mucosa, and tongue normal  NECK: supple, no JVD, thyroid normal size, non-tender, without nodularity LYMPH:  no palpable lymphadenopathy in the cervical, axillary or inguinal LUNGS: clear to auscultation with normal respiratory effort HEART: regular rate & rhythm,  no murmurs and no lower extremity edema ABDOMEN: abdomen soft, non-tender, normoactive bowel sounds  Musculoskeletal: grade 1 edema PSYCH: alert & oriented x 3 with fluent speech NEURO: no focal motor/sensory deficits  MEDICAL HISTORY:  Past Medical History:  Diagnosis Date  . ALLERGIC RHINITIS    . Diabetes mellitus   . Hyperlipidemia     SURGICAL HISTORY: Past Surgical History:  Procedure Laterality Date  . BIOPSY  03/15/2018   Procedure: BIOPSY;  Surgeon: Milus Banister, MD;  Location: [facility];  Service: Endoscopy;;  . CLEFT PALATE REPAIR    . COLONOSCOPY N/A 03/15/2018   Procedure: COLONOSCOPY;  Surgeon: Milus Banister, MD;  Location: [facility];  Service: Endoscopy;  Laterality: N/A;  . deviated septum repair     slight improvement  . ESOPHAGOGASTRODUODENOSCOPY N/A 03/15/2018   Procedure: ESOPHAGOGASTRODUODENOSCOPY (EGD);  Surgeon: Milus Banister, MD;  Location: [name];  Service: Endoscopy;  Laterality: N/A;  . IR IMAGING GUIDED PORT INSERTION  03/17/2018  . TONSILLECTOMY      SOCIAL HISTORY: Social History   Socioeconomic History  . Marital status: Married    Spouse name: Not on file  . Number of children: Not on file  . Years of education: Not on file  . Highest education level: Not on file  Occupational History  . Not on file  Social Needs  . Financial resource strain: Not on file  . Food insecurity:    Worry: Not on file    Inability: Not on file  . Transportation needs:    Medical: Not on file    Non-medical: Not on file  Tobacco Use  . Smoking status: Never Smoker  . Smokeless tobacco: Never Used  Substance and Sexual Activity  . Alcohol use: Yes    Comment: occasional  . Drug use: No  .  Sexual activity: Yes  Lifestyle  . Physical activity:    Days per week: Not on file    Minutes per session: Not on file  . Stress: Not on file  Relationships  . Social connections:    Talks on phone: Not on file    Gets together: Not on file    Attends religious service: Not on file    Active member of club or organization: Not on file    Attends meetings of clubs or organizations: Not on file    Relationship status: Not on file  . Intimate partner violence:    Fear of current or ex partner: Not on file    Emotionally abused: Not on file     Physically abused: Not on file    Forced sexual activity: Not on file  Other Topics Concern  . Not on file  Social History Narrative   Married 1985. No kids. 4 small dogs.       Works in Financial trader, residential      Hobbies: work on cars, Haematologist, exercise as able    FAMILY HISTORY: Family History  Problem Relation Age of Onset  . Lung cancer Mother        smoker  . Brain cancer Mother        metastasis  . AAA (abdominal aortic aneurysm) Father        smoker    ALLERGIES:  is allergic to ciprofloxacin.  MEDICATIONS:  Scheduled Meds: . allopurinol  300 mg Oral QPM  . DOXOrubicin/vinCRIStine/etoposide CHEMO IV infusion for Inpatient CI   Intravenous Once  . enoxaparin  150 mg Subcutaneous Q24H  . linagliptin  5 mg Oral Q breakfast  . metFORMIN  1,000 mg Oral BID WC  . predniSONE  30 mg Oral BID WC   Continuous Infusions: . sodium chloride 10 mL/hr at 04/10/18 1603   PRN Meds:.acetaminophen, alteplase, heparin lock flush, heparin lock flush, sodium chloride flush, sodium chloride flush  REVIEW OF SYSTEMS:    10 Point review of Systems was done is negative except as noted above.   LABORATORY DATA:  I have reviewed the data as listed  . CBC Latest Ref Rng & Units 04/11/2018 04/10/2018 04/06/2018  WBC 4.0 - 10.5 K/uL 9.7 10.0 15.3(H)  Hemoglobin 13.0 - 17.0 g/dL 11.2(L) 11.1(L) 10.5(L)  Hematocrit 39.0 - 52.0 % 34.4(L) 35.2(L) 31.4(L)  Platelets 150 - 400 K/uL 646(H) 588(H) 640(H)    . CMP Latest Ref Rng & Units 04/11/2018 04/10/2018 04/06/2018  Glucose 70 - 99 mg/dL 197(H) 224(H) 162(H)  BUN 8 - 23 mg/dL 15 14 10   Creatinine 0.61 - 1.24 mg/dL 0.72 0.93 0.76  Sodium 135 - 145 mmol/L 140 135 140  Potassium 3.5 - 5.1 mmol/L 4.7 4.7 4.0  Chloride 98 - 111 mmol/L 107 103 105  CO2 22 - 32 mmol/L 25 25 25   Calcium 8.9 - 10.3 mg/dL 9.8 9.7 9.7  Total Protein 6.5 - 8.1 g/dL 6.7 6.8 6.2(L)  Total Bilirubin 0.3 - 1.2 mg/dL 0.5 0.5 <0.2(L)  Alkaline Phos 38 -  126 U/L 48 57 93  AST 15 - 41 U/L 18 18 15   ALT 0 - 44 U/L 19 20 26      RADIOGRAPHIC STUDIES: I have personally reviewed the radiological images as listed and agreed with the findings in the report. US Scrotum  Result Date: 03/15/2018 CLINICAL DATA:  Scrotal swelling EXAM: SCROTAL ULTRASOUND DOPPLER ULTRASOUND OF THE TESTICLES TECHNIQUE: Complete ultrasound examination of the testicles,  epididymis, and other scrotal structures was performed. Color and spectral Doppler ultrasound were also utilized to evaluate blood flow to the testicles. COMPARISON:  CT 03/10/2018 FINDINGS: Right testicle Measurements: 4.2 x 2.5 x 2.6 cm. No mass or microlithiasis visualized. Left testicle Measurements: 4.2 x 2.5 x 2.7 cm. No mass or microlithiasis visualized. Right epididymis:  Cyst measuring 9 x 8 x 11 mm Left epididymis:  Cyst measuring 6 x 4 mm Hydrocele:  Small left hydrocele.  Moderate right hydrocele. Varicocele:  Small right varicocele. Significant scrotal skin thickening with diffuse edema in the soft tissues Pulsed Doppler interrogation of both testes demonstrates normal low resistance arterial and venous waveforms bilaterally. IMPRESSION: 1. Negative for testicular torsion or intratesticular mass 2. Bilateral hydroceles, small on the left and moderate on the right 3. Small right varicocele 4. Extensive scrotal edema and skin thickening Electronically Signed   By: Donavan Foil M.D.   On: 03/15/2018 20:19   Ct Biopsy  Result Date: 03/17/2018 INDICATION: Recent diagnosis of lymphoma. Please perform CT-guided bone marrow biopsy for tissue diagnostic purposes. EXAM: CT-GUIDED BONE MARROW BIOPSY AND ASPIRATION MEDICATIONS: None ANESTHESIA/SEDATION: Fentanyl 100 mcg IV; Versed 2 mg IV Sedation Time: 10 Minutes; The patient was continuously monitored during the procedure by the interventional radiology nurse under my direct supervision. COMPLICATIONS: None immediate. PROCEDURE: Informed consent was obtained from the  patient following an explanation of the procedure, risks, benefits and alternatives. The patient understands, agrees and consents for the procedure. All questions were addressed. A time out was performed prior to the initiation of the procedure. The patient was positioned left lateral decubitus and non-contrast localization CT was performed of the pelvis to demonstrate the iliac marrow spaces. The operative site was prepped and draped in the usual sterile fashion. Under sterile conditions and local anesthesia, a 22 gauge spinal needle was utilized for procedural planning. Next, an 11 gauge coaxial bone biopsy needle was advanced into the left iliac marrow space. Needle position was confirmed with CT imaging. Initially, bone marrow aspiration was performed. Next, a bone marrow biopsy was obtained with the 11 gauge outer bone marrow device. Samples were prepared with the cytotechnologist and deemed adequate. The needle was removed intact. Hemostasis was obtained with compression and a dressing was placed. The patient tolerated the procedure well without immediate post procedural complication. IMPRESSION: Successful CT guided left iliac bone marrow aspiration and core biopsy. Electronically Signed   By: Sandi Mariscal M.D.   On: 03/17/2018 10:41   Ct Biopsy  Result Date: 03/13/2018 CLINICAL DATA:  Large infiltrative masses of the peritoneal cavity. EXAM: CT GUIDED CORE BIOPSY OF PERITONEAL MASS ANESTHESIA/SEDATION: 2.0 mg IV Versed; 100 mcg IV Fentanyl Total Moderate Sedation Time:  17 minutes. The patient's level of consciousness and physiologic status were continuously monitored during the procedure by Radiology nursing. PROCEDURE: The procedure risks, benefits, and alternatives were explained to the patient. Questions regarding the procedure were encouraged and answered. The patient understands and consents to the procedure. A time-out was performed prior to initiating the procedure. The operative field was prepped  with chlorhexidine in a sterile fashion, and a sterile drape was applied covering the operative field. A sterile gown and sterile gloves were used for the procedure. Local anesthesia was provided with 1% Lidocaine. Under CT guidance, a 17 gauge trocar needle was advanced into the right lower peritoneal cavity. Five separate coaxial 18 gauge core biopsy samples were obtained and submitted in both saline and formalin. Additional CT imaging was performed after  outer needle removal. COMPLICATIONS: None FINDINGS: Right lower quadrant portion bulky peritoneal tumor was chosen for sampling. Solid tumor was obtained. There were no immediate complications. IMPRESSION: CT-guided core biopsy performed of bulky tumor in the right lower peritoneal cavity. Electronically Signed   By: Aletta Edouard M.D.   On: 03/13/2018 17:36   Ct Bone Marrow Biopsy  Result Date: 03/17/2018 INDICATION: Recent diagnosis of lymphoma. Please perform CT-guided bone marrow biopsy for tissue diagnostic purposes. EXAM: CT-GUIDED BONE MARROW BIOPSY AND ASPIRATION MEDICATIONS: None ANESTHESIA/SEDATION: Fentanyl 100 mcg IV; Versed 2 mg IV Sedation Time: 10 Minutes; The patient was continuously monitored during the procedure by the interventional radiology nurse under my direct supervision. COMPLICATIONS: None immediate. PROCEDURE: Informed consent was obtained from the patient following an explanation of the procedure, risks, benefits and alternatives. The patient understands, agrees and consents for the procedure. All questions were addressed. A time out was performed prior to the initiation of the procedure. The patient was positioned left lateral decubitus and non-contrast localization CT was performed of the pelvis to demonstrate the iliac marrow spaces. The operative site was prepped and draped in the usual sterile fashion. Under sterile conditions and local anesthesia, a 22 gauge spinal needle was utilized for procedural planning. Next, an 11  gauge coaxial bone biopsy needle was advanced into the left iliac marrow space. Needle position was confirmed with CT imaging. Initially, bone marrow aspiration was performed. Next, a bone marrow biopsy was obtained with the 11 gauge outer bone marrow device. Samples were prepared with the cytotechnologist and deemed adequate. The needle was removed intact. Hemostasis was obtained with compression and a dressing was placed. The patient tolerated the procedure well without immediate post procedural complication. IMPRESSION: Successful CT guided left iliac bone marrow aspiration and core biopsy. Electronically Signed   By: Sandi Mariscal M.D.   On: 03/17/2018 10:41   Dg Fluoro Guided Loc Of Needle/cath Tip For Spinal Inject Rt  Result Date: 03/21/2018 CLINICAL DATA:  62 year old male with history of high-grade B-cell lymphoma. EXAM: FLUOROSCOPICALLY GUIDED LUMBAR PUNCTURE FOR INTRATHECAL CHEMOTHERAPY TECHNIQUE: Informed consent was obtained from the patient prior to the procedure, including potential complications of headache, allergy, and pain. A 'time out' was performed. With the patient prone, the lower back was prepped with Betadine. 1% Lidocaine was used for local anesthesia. Lumbar puncture was performed at the L2-L3 level using a 20 gauge needle with return of clear CSF. A total of 8 mL of CSF was collected into sterile tubing and sent to the laboratory for testing and analysis per orders of the primary medical team. Subsequently, 5 mL of methotrexate was injected into the subarachnoid space. The patient tolerated the procedure well without apparent complication. FLUOROSCOPY TIME:  48 seconds IMPRESSION: 1. Intrathecal injection of chemotherapy without complication. Electronically Signed   By: Vinnie Langton M.D.   On: 03/21/2018 14:39   US Scrotum Doppler  Result Date: 03/15/2018 CLINICAL DATA:  Scrotal swelling EXAM: SCROTAL ULTRASOUND DOPPLER ULTRASOUND OF THE TESTICLES TECHNIQUE: Complete ultrasound  examination of the testicles, epididymis, and other scrotal structures was performed. Color and spectral Doppler ultrasound were also utilized to evaluate blood flow to the testicles. COMPARISON:  CT 03/10/2018 FINDINGS: Right testicle Measurements: 4.2 x 2.5 x 2.6 cm. No mass or microlithiasis visualized. Left testicle Measurements: 4.2 x 2.5 x 2.7 cm. No mass or microlithiasis visualized. Right epididymis:  Cyst measuring 9 x 8 x 11 mm Left epididymis:  Cyst measuring 6 x 4 mm Hydrocele:  Small  left hydrocele.  Moderate right hydrocele. Varicocele:  Small right varicocele. Significant scrotal skin thickening with diffuse edema in the soft tissues Pulsed Doppler interrogation of both testes demonstrates normal low resistance arterial and venous waveforms bilaterally. IMPRESSION: 1. Negative for testicular torsion or intratesticular mass 2. Bilateral hydroceles, small on the left and moderate on the right 3. Small right varicocele 4. Extensive scrotal edema and skin thickening Electronically Signed   By: Donavan Foil M.D.   On: 03/15/2018 20:19   Ct Venogram Abd/pel  Result Date: 03/23/2018 CLINICAL DATA:  Right lower extremity DVT EXAM: CT VENOGRAM ABD-PELVIS TECHNIQUE: Multidetector CT imaging of the abdomen and pelvis was performed using the standard protocol during bolus administration of intravenous contrast. Multiplanar reconstructed images and MIPs were obtained and reviewed to evaluate the venous anatomy. CONTRAST:  161m ISOVUE-300 IOPAMIDOL (ISOVUE-300) INJECTION 61% COMPARISON:  03/10/2018 FINDINGS: VASCULAR Veins: Hepatic veins, portal vein, and splenic vein are patent. Superior mesenteric vein is patent. IVC is patent.  Renal veins are patent. Left common and external iliac veins are patent. The right common and external iliac veins are patent. There is compression of the right common and external iliac veins secondary to residual retroperitoneal hemorrhage and/or infiltrative mass. There is  low-density filling defect in the right common femoral vein compatible with DVT. The right femoral vein is grossly patent. No evidence of left common femoral or femoral DVT. Review of the MIP images confirms the above findings. NON-VASCULAR Lower chest: Bibasilar atelectasis for scarring. Hepatobiliary: Liver is unremarkable.  Gallbladder is decompressed. Pancreas: Unremarkable Spleen: Unremarkable Adrenals/Urinary Tract: Nonspecific hypodensity in the mid right kidney. Left kidney is within normal limits. Right hydronephrosis has nearly resolved. Small calculus in the posterior lower pole of the right kidney is unchanged. Stomach/Bowel: Severe wall thickening of a loop of bowel in the right lower quadrant either represents the terminal ileum or cecum. Wall thickening is very nodular and this is worrisome for adenocarcinoma or lymphoma. There is no evidence of small-bowel obstruction. The appendix is not readily visualized. There is no definite extraluminal bowel gas to suggest perforation. Stomach and duodenum are unremarkable. Lymphatic: There is no abnormal retroperitoneal adenopathy. Mild atherosclerotic calcification of the aorta without aneurysm. Reproductive: Prostate is unremarkable other than calcifications. Other: On the prior study, there was ill-defined and infiltrative soft tissue density throughout the right lower quadrant of the abdomen and extending into the right iliopsoas musculature and right inguinal region. This soft tissue density has largely resolved with some residual stranding throughout the retroperitoneum. The large infiltrative mass is now simply replaced by stranding and ill-defined bowel loops. Expansion of the right iliopsoas musculature has also improved. There is stranding throughout the musculature of the proximal right thigh which is stable. Due to the dramatic improvement of the infiltrative mass, this may represent a dramatic response to chemotherapy if this correlates with the  given history. Alternatively, the ill-defined mass may represent hemorrhage which has evolved. Musculoskeletal: No vertebral compression deformity. IMPRESSION: VASCULAR There is no evidence of DVT in the abdomen or pelvis. Specifically, there is no evidence of right common or external iliac vein DVT. The study is positive for DVT in the right common femoral vein. NON-VASCULAR The large infiltrative mass in the right lower quadrant has largely improved. The dramatic improvement either represents a rapid response to chemotherapy or possibly evolving hemorrhage. Correlate clinically with vital signs and hemoglobin levels. The right hydronephrosis has also nearly resolved. There is persistent compression of the right common and external iliac  veins due to residual retroperitoneal hemorrhage. Circumferential mass in the terminal ileum or cecum worrisome for adenocarcinoma or lymphoma. It is conceivable that the wall thickening may be related to hemorrhage in the wall of bowel, but it is rather nodular suggesting neoplasm. Electronically Signed   By: Marybelle Killings M.D.   On: 03/23/2018 08:17   Ir Imaging Guided Port Insertion  Result Date: 03/17/2018 INDICATION: Recent diagnosis of lymphoma. In need of durable intravenous access for chemotherapy administration. EXAM: IMPLANTED PORT A CATH PLACEMENT WITH ULTRASOUND AND FLUOROSCOPIC GUIDANCE COMPARISON:  CT the chest, abdomen and pelvis-03/10/2018 MEDICATIONS: Ancef 2 gm IV; The antibiotic was administered within an appropriate time interval prior to skin puncture. ANESTHESIA/SEDATION: Moderate (conscious) sedation was employed during this procedure. A total of Versed 0.5 mg and Fentanyl 25 mcg was administered intravenously. Moderate Sedation Time: 26 minutes. The patient's level of consciousness and vital signs were monitored continuously by radiology nursing throughout the procedure under my direct supervision. CONTRAST:  None FLUOROSCOPY TIME:  12 seconds (5 mGy)  COMPLICATIONS: None immediate. PROCEDURE: The procedure, risks, benefits, and alternatives were explained to the patient. Questions regarding the procedure were encouraged and answered. The patient understands and consents to the procedure. The right neck and chest were prepped with chlorhexidine in a sterile fashion, and a sterile drape was applied covering the operative field. Maximum barrier sterile technique with sterile gowns and gloves were used for the procedure. A timeout was performed prior to the initiation of the procedure. Local anesthesia was provided with 1% lidocaine with epinephrine. After creating a small venotomy incision, a micropuncture kit was utilized to access the internal jugular vein. Real-time ultrasound guidance was utilized for vascular access including the acquisition of a permanent ultrasound image documenting patency of the accessed vessel. The microwire was utilized to measure appropriate catheter length. A subcutaneous port pocket was then created along the upper chest wall utilizing a combination of sharp and blunt dissection. The pocket was irrigated with sterile saline. A single lumen ISP power injectable port was chosen for placement. The 8 Fr catheter was tunneled from the port pocket site to the venotomy incision. The port was placed in the pocket. The external catheter was trimmed to appropriate length. At the venotomy, an 8 Fr peel-away sheath was placed over a guidewire under fluoroscopic guidance. The catheter was then placed through the sheath and the sheath was removed. Final catheter positioning was confirmed and documented with a fluoroscopic spot radiograph. The port was accessed with a Huber needle and was noted to easily aspirate and flush. The Port a Catheter was left accessed for the initiation of chemotherapy later today. The venotomy site was closed with an interrupted 4-0 Vicryl suture. The port pocket incision was closed with interrupted 2-0 Vicryl suture and  the skin was opposed with a running subcuticular 4-0 Vicryl suture. Dermabond and Steri-strips were applied to both incisions. Dressings were placed. The patient tolerated the procedure well without immediate post procedural complication. FINDINGS: After catheter placement, the tip lies within the superior cavoatrial junction. The catheter aspirates and flushes normally and is ready for immediate use. IMPRESSION: Successful placement of a right internal jugular approach power injectable Port-A-Cath. The catheter is ready for immediate use. Electronically Signed   By: Sandi Mariscal M.D.   On: 03/17/2018 10:42    ASSESSMENT & PLAN:   62 y.o. male with   62 y.o. male with  1. Burkitt ymphomastage IIAE  bulky disease 03/10/18 CT A/P revealedLarge irregular infiltrative solid  mass in the right lower quadrant measuring up to 18.7 x 18.5 x 17.8 cm, infiltrating and encasing multiple distal small bowel loops and likely the ileocecal region, partially encasing the sigmoid colon, with prominent extension into the right lower retroperitoneum and extraperitoneal right pelvis with encasement of right external iliac and proximal right common iliac vasculature and infiltration of the right iliopsoas muscle  PLAN: -labs stable -no prohbitive toxicities from [place] -Proceeding with C2D2 of EPOCH today.- will need to adjust infusion rate to complete chemotherapy before noon on Friday to allow for neulasta at noon on Saturday.   -neulasta as outpatient on 04/15/2018 -Rituxan as outpatient on 04/17/2018 -Will add intrathecal Methotrexate back from C3 to avoid interrupting anticoagulation. -Will discontinue Allopurinol after C2 if no evidence of TLS -Continue eating well, hydrating well and walking around as often as reasonably possible  2. RLE DVT and PE -continue Lovenox 184m [place] daily  3) GI bleeding related to bowel involvement by lymphoma. -no overt bleeding at this time -continue to monitor. hgb  stable.  4) DVT px -- on therapeutic lovenox  . The total time spent in the appointment was 25 minutes and more than 50% was on counseling and direct patient cares.     GSullivan LoneMD MS AAHIVMS SAmerican Surgisite CentersCHospital OrienteHematology/Oncology Physician [facility] (Office):       [phone](Work cell):  [phone](Fax):           [phone] 04/11/2018 3:57 PM  I, SBaldwin [place] am acting as a scribe for Dr. KIrene Limbo .I have reviewed the above documentation for accuracy and completeness, and I agree with the above. GSullivan LoneMD MS

## 2018-04-11 NOTE — Progress Notes (Signed)
Inpatient Diabetes Program Recommendations  AACE/ADA: New Consensus Statement on Inpatient Glycemic Control (2019)  Target Ranges:  Prepandial:   less than 140 mg/dL      Peak postprandial:   less than 180 mg/dL (1-2 hours)      Critically ill patients:  140 - 180 mg/dL   Results for Mark, Frey (MRN [id number]) as of 04/11/2018 10:49  Ref. Range 04/10/2018 19:53 04/11/2018 05:00  Glucose Latest Ref Range: 70 - 99 mg/dL 224 (H) 197 (H)   Review of Glycemic Control  Diabetes history: DM2 Outpatient Diabetes medications: Janumet 50-1000 mg BID Current orders for Inpatient glycemic control: Tradjenta 5 mg QAM, Metformin 1000 mg BID  Inpatient Diabetes Program Recommendations: Correction (SSI): While inpatient, please consider ordering Novolog 0-9 units TID wtih meals and Novolog 0-5 units QHS.  Thanks, Barnie Alderman, RN, MSN, CDE Diabetes Coordinator Inpatient Diabetes Program [phone] (Team Pager from 8am to 5pm)

## 2018-04-12 ENCOUNTER — Ambulatory Visit: Payer: BLUE CROSS/BLUE SHIELD

## 2018-04-12 LAB — COMPREHENSIVE METABOLIC PANEL
ALBUMIN: 3.2 g/dL — AB (ref 3.5–5.0)
ALK PHOS: 44 U/L (ref 38–126)
ALT: 19 U/L (ref 0–44)
AST: 16 U/L (ref 15–41)
Anion gap: 9 (ref 5–15)
BILIRUBIN TOTAL: 0.5 mg/dL (ref 0.3–1.2)
BUN: 22 mg/dL (ref 8–23)
CALCIUM: 9.2 mg/dL (ref 8.9–10.3)
CO2: 24 mmol/L (ref 22–32)
Chloride: 106 mmol/L (ref 98–111)
Creatinine, Ser: 0.77 mg/dL (ref 0.61–1.24)
GFR calc Af Amer: >60 mL/min (ref >60)
GFR calc non Af Amer: >60 mL/min (ref >60)
GLUCOSE: 166 mg/dL — AB (ref 70–99)
Potassium: 4.3 mmol/L (ref 3.5–5.1)
Sodium: 139 mmol/L (ref 135–145)
Total Protein: 6.3 g/dL — ABNORMAL LOW (ref 6.5–8.1)

## 2018-04-12 LAB — CBC
HEMATOCRIT: 31.5 % — AB (ref 39.0–52.0)
HEMOGLOBIN: 10.3 g/dL — AB (ref 13.0–17.0)
MCH: 28.9 pg (ref 26.0–34.0)
MCHC: 32.7 g/dL (ref 30.0–36.0)
MCV: 88.2 fL (ref 78.0–100.0)
Platelets: 583 10*3/uL — ABNORMAL HIGH (ref 150–400)
RBC: 3.57 MIL/uL — ABNORMAL LOW (ref 4.22–5.81)
RDW: 17.3 % — ABNORMAL HIGH (ref 11.5–15.5)
WBC: 21 10*3/uL — ABNORMAL HIGH (ref 4.0–10.5)

## 2018-04-12 LAB — URIC ACID: Uric Acid, Serum: 3.9 mg/dL (ref 3.7–8.6)

## 2018-04-12 MED ORDER — DOCUSATE SODIUM 100 MG PO CAPS
100.0000 mg | ORAL_CAPSULE | Freq: Two times a day (BID) | ORAL | Status: DC | PRN
  Administered 2018-04-12: 100 mg via ORAL

## 2018-04-12 MED ORDER — SODIUM CHLORIDE 0.9 % IV SOLN
Freq: Once | INTRAVENOUS | Status: AC
  Administered 2018-04-12: 8 mg via INTRAVENOUS
  Filled 2018-04-12: qty 4

## 2018-04-12 MED ORDER — VINCRISTINE SULFATE CHEMO INJECTION 1 MG/ML
Freq: Once | INTRAVENOUS | Status: AC
  Administered 2018-04-12: 16:00:00 via INTRAVENOUS
  Filled 2018-04-12: qty 12

## 2018-04-12 NOTE — Progress Notes (Signed)
HEMATOLOGY/ONCOLOGY INPATIENT PROGRESS NOTE  Date of Service: 04/12/2018  Inpatient Attending: .[name], MD   SUBJECTIVE:    Mark Frey reports that he is doing well overall and has been as active as reasonably possible, walking the halls with his wife frequently.   The pt reports that his leg swelling continues to resolve. He notes that he has not had a bowel movement today but continues to not have abdominal pains. He notes that he had a hard bowel movement last night which did not have any blood in the stools.   The pt notes that he continues to tolerate [facility] well. He notes that he slept very well last night and decided he does not need anything to help him sleep.   Lab results today (04/12/18) of CBC is as follows: all values are WNL except for WBC at 21.0k, RBC at 3.57, HGB at 10.3, HCT at 31.5, RDW at 17.3, PLT at 583k.  On review of systems, pt reports resolving leg swelling, sleeping well, staying active, urinating well, and denies abdominal pains, mouth sores, blood in the stools, any pains or soreness, and any other symptoms.    OBJECTIVE:  NAD PHYSICAL EXAMINATION: . Vitals:   04/11/18 0548 04/11/18 1340 04/11/18 2206 04/12/18 0641  BP: 127/85 113/74 138/83 136/85  Pulse: 97 (!) 108 (!) 105 90  Resp: 20 18 18 20   Temp: 97.9 F (36.6 C) 98.8 F (37.1 C) 98.2 F (36.8 C) 98 F (36.7 C)  TempSrc: Oral Oral Oral Oral  SpO2: 98% 96% 97% 98%   . GENERAL:alert, in no acute distress and comfortable SKIN: no acute rashes, no significant lesions EYES: conjunctiva are pink and non-injected, sclera anicteric OROPHARYNX: MMM, no exudates, no oropharyngeal erythema or ulceration NECK: supple, no JVD LYMPH:  no palpable lymphadenopathy in the cervical, axillary or inguinal regions LUNGS: clear to auscultation b/l with normal respiratory effort HEART: regular rate & rhythm ABDOMEN:  normoactive bowel sounds , non tender, not  distended. Extremity:grade 1 rt pedal edema PSYCH: alert & oriented x 3 with fluent speech NEURO: no focal motor/sensory deficits    MEDICAL HISTORY:  Past Medical History:  Diagnosis Date  . ALLERGIC RHINITIS   . Diabetes mellitus   . Hyperlipidemia     SURGICAL HISTORY: Past Surgical History:  Procedure Laterality Date  . BIOPSY  03/15/2018   Procedure: BIOPSY;  Surgeon: Milus Banister, MD;  Location: [facility]OSCOPY;  Service: Endoscopy;;  . CLEFT PALATE REPAIR    . COLONOSCOPY N/A 03/15/2018   Procedure: COLONOSCOPY;  Surgeon: Milus Banister, MD;  Location: [facility]OSCOPY;  Service: Endoscopy;  Laterality: N/A;  . deviated septum repair     slight improvement  . ESOPHAGOGASTRODUODENOSCOPY N/A 03/15/2018   Procedure: ESOPHAGOGASTRODUODENOSCOPY (EGD);  Surgeon: Milus Banister, MD;  Location: [name];  Service: Endoscopy;  Laterality: N/A;  . IR IMAGING GUIDED PORT INSERTION  03/17/2018  . TONSILLECTOMY      SOCIAL HISTORY: Social History   Socioeconomic History  . Marital status: Married    Spouse name: Not on file  . Number of children: Not on file  . Years of education: Not on file  . Highest education level: Not on file  Occupational History  . Not on file  Social Needs  . Financial resource strain: Not on file  . Food insecurity:    Worry: Not on file    Inability: Not on file  . Transportation needs:    Medical: Not  on file    Non-medical: Not on file  Tobacco Use  . Smoking status: Never Smoker  . Smokeless tobacco: Never Used  Substance and Sexual Activity  . Alcohol use: Yes    Comment: occasional  . Drug use: No  . Sexual activity: Yes  Lifestyle  . Physical activity:    Days per week: Not on file    Minutes per session: Not on file  . Stress: Not on file  Relationships  . Social connections:    Talks on phone: Not on file    Gets together: Not on file    Attends religious service: Not on file    Active member of club or organization: Not  on file    Attends meetings of clubs or organizations: Not on file    Relationship status: Not on file  . Intimate partner violence:    Fear of current or ex partner: Not on file    Emotionally abused: Not on file    Physically abused: Not on file    Forced sexual activity: Not on file  Other Topics Concern  . Not on file  Social History Narrative   Married 1985. No kids. 4 small dogs.       Works in Financial trader, residential      Hobbies: work on cars, Haematologist, exercise as able    FAMILY HISTORY: Family History  Problem Relation Age of Onset  . Lung cancer Mother        smoker  . Brain cancer Mother        metastasis  . AAA (abdominal aortic aneurysm) Father        smoker    ALLERGIES:  is allergic to ciprofloxacin.  MEDICATIONS:  Scheduled Meds: . allopurinol  300 mg Oral QPM  . DOXOrubicin/vinCRIStine/etoposide CHEMO IV infusion for Inpatient CI   Intravenous Once  . enoxaparin  150 mg Subcutaneous Q24H  . linagliptin  5 mg Oral Q breakfast  . metFORMIN  1,000 mg Oral BID WC  . predniSONE  30 mg Oral BID WC   Continuous Infusions: . sodium chloride 20 mL/hr at 04/12/18 0300   PRN Meds:.acetaminophen, alteplase, heparin lock flush, heparin lock flush, sodium chloride flush, sodium chloride flush  REVIEW OF SYSTEMS:    10 Point review of Systems was done is negative except as noted above.   LABORATORY DATA:  I have reviewed the data as listed  CBC Latest Ref Rng & Units 04/12/2018 04/11/2018 04/10/2018  WBC 4.0 - 10.5 K/uL 21.0(H) 9.7 10.0  Hemoglobin 13.0 - 17.0 g/dL 10.3(L) 11.2(L) 11.1(L)  Hematocrit 39.0 - 52.0 % 31.5(L) 34.4(L) 35.2(L)  Platelets 150 - 400 K/uL 583(H) 646(H) 588(H)    . CMP Latest Ref Rng & Units 04/12/2018 04/11/2018 04/10/2018  Glucose 70 - 99 mg/dL 166(H) 197(H) 224(H)  BUN 8 - 23 mg/dL 22 15 14   Creatinine 0.61 - 1.24 mg/dL 0.77 0.72 0.93  Sodium 135 - 145 mmol/L 139 140 135  Potassium 3.5 - 5.1 mmol/L 4.3 4.7 4.7   Chloride 98 - 111 mmol/L 106 107 103  CO2 22 - 32 mmol/L 24 25 25   Calcium 8.9 - 10.3 mg/dL 9.2 9.8 9.7  Total Protein 6.5 - 8.1 g/dL 6.3(L) 6.7 6.8  Total Bilirubin 0.3 - 1.2 mg/dL 0.5 0.5 0.5  Alkaline Phos 38 - 126 U/L 44 48 57  AST 15 - 41 U/L 16 18 18   ALT 0 - 44 U/L 19 19 20  RADIOGRAPHIC STUDIES: I have personally reviewed the radiological images as listed and agreed with the findings in the report.  US Scrotum  Result Date: 03/15/2018 CLINICAL DATA:  Scrotal swelling EXAM: SCROTAL ULTRASOUND DOPPLER ULTRASOUND OF THE TESTICLES TECHNIQUE: Complete ultrasound examination of the testicles, epididymis, and other scrotal structures was performed. Color and spectral Doppler ultrasound were also utilized to evaluate blood flow to the testicles. COMPARISON:  CT 03/10/2018 FINDINGS: Right testicle Measurements: 4.2 x 2.5 x 2.6 cm. No mass or microlithiasis visualized. Left testicle Measurements: 4.2 x 2.5 x 2.7 cm. No mass or microlithiasis visualized. Right epididymis:  Cyst measuring 9 x 8 x 11 mm Left epididymis:  Cyst measuring 6 x 4 mm Hydrocele:  Small left hydrocele.  Moderate right hydrocele. Varicocele:  Small right varicocele. Significant scrotal skin thickening with diffuse edema in the soft tissues Pulsed Doppler interrogation of both testes demonstrates normal low resistance arterial and venous waveforms bilaterally. IMPRESSION: 1. Negative for testicular torsion or intratesticular mass 2. Bilateral hydroceles, small on the left and moderate on the right 3. Small right varicocele 4. Extensive scrotal edema and skin thickening Electronically Signed   By: Donavan Foil M.D.   On: 03/15/2018 20:19   Ct Biopsy  Result Date: 03/17/2018 INDICATION: Recent diagnosis of lymphoma. Please perform CT-guided bone marrow biopsy for tissue diagnostic purposes. EXAM: CT-GUIDED BONE MARROW BIOPSY AND ASPIRATION MEDICATIONS: None ANESTHESIA/SEDATION: Fentanyl 100 mcg IV; Versed 2 mg IV Sedation  Time: 10 Minutes; The patient was continuously monitored during the procedure by the interventional radiology nurse under my direct supervision. COMPLICATIONS: None immediate. PROCEDURE: Informed consent was obtained from the patient following an explanation of the procedure, risks, benefits and alternatives. The patient understands, agrees and consents for the procedure. All questions were addressed. A time out was performed prior to the initiation of the procedure. The patient was positioned left lateral decubitus and non-contrast localization CT was performed of the pelvis to demonstrate the iliac marrow spaces. The operative site was prepped and draped in the usual sterile fashion. Under sterile conditions and local anesthesia, a 22 gauge spinal needle was utilized for procedural planning. Next, an 11 gauge coaxial bone biopsy needle was advanced into the left iliac marrow space. Needle position was confirmed with CT imaging. Initially, bone marrow aspiration was performed. Next, a bone marrow biopsy was obtained with the 11 gauge outer bone marrow device. Samples were prepared with the cytotechnologist and deemed adequate. The needle was removed intact. Hemostasis was obtained with compression and a dressing was placed. The patient tolerated the procedure well without immediate post procedural complication. IMPRESSION: Successful CT guided left iliac bone marrow aspiration and core biopsy. Electronically Signed   By: Sandi Mariscal M.D.   On: 03/17/2018 10:41   Ct Biopsy  Result Date: 03/13/2018 CLINICAL DATA:  Large infiltrative masses of the peritoneal cavity. EXAM: CT GUIDED CORE BIOPSY OF PERITONEAL MASS ANESTHESIA/SEDATION: 2.0 mg IV Versed; 100 mcg IV Fentanyl Total Moderate Sedation Time:  17 minutes. The patient's level of consciousness and physiologic status were continuously monitored during the procedure by Radiology nursing. PROCEDURE: The procedure risks, benefits, and alternatives were explained  to the patient. Questions regarding the procedure were encouraged and answered. The patient understands and consents to the procedure. A time-out was performed prior to initiating the procedure. The operative field was prepped with chlorhexidine in a sterile fashion, and a sterile drape was applied covering the operative field. A sterile gown and sterile gloves were used for the procedure.  Local anesthesia was provided with 1% Lidocaine. Under CT guidance, a 17 gauge trocar needle was advanced into the right lower peritoneal cavity. Five separate coaxial 18 gauge core biopsy samples were obtained and submitted in both saline and formalin. Additional CT imaging was performed after outer needle removal. COMPLICATIONS: None FINDINGS: Right lower quadrant portion bulky peritoneal tumor was chosen for sampling. Solid tumor was obtained. There were no immediate complications. IMPRESSION: CT-guided core biopsy performed of bulky tumor in the right lower peritoneal cavity. Electronically Signed   By: Aletta Edouard M.D.   On: 03/13/2018 17:36   Ct Bone Marrow Biopsy  Result Date: 03/17/2018 INDICATION: Recent diagnosis of lymphoma. Please perform CT-guided bone marrow biopsy for tissue diagnostic purposes. EXAM: CT-GUIDED BONE MARROW BIOPSY AND ASPIRATION MEDICATIONS: None ANESTHESIA/SEDATION: Fentanyl 100 mcg IV; Versed 2 mg IV Sedation Time: 10 Minutes; The patient was continuously monitored during the procedure by the interventional radiology nurse under my direct supervision. COMPLICATIONS: None immediate. PROCEDURE: Informed consent was obtained from the patient following an explanation of the procedure, risks, benefits and alternatives. The patient understands, agrees and consents for the procedure. All questions were addressed. A time out was performed prior to the initiation of the procedure. The patient was positioned left lateral decubitus and non-contrast localization CT was performed of the pelvis to  demonstrate the iliac marrow spaces. The operative site was prepped and draped in the usual sterile fashion. Under sterile conditions and local anesthesia, a 22 gauge spinal needle was utilized for procedural planning. Next, an 11 gauge coaxial bone biopsy needle was advanced into the left iliac marrow space. Needle position was confirmed with CT imaging. Initially, bone marrow aspiration was performed. Next, a bone marrow biopsy was obtained with the 11 gauge outer bone marrow device. Samples were prepared with the cytotechnologist and deemed adequate. The needle was removed intact. Hemostasis was obtained with compression and a dressing was placed. The patient tolerated the procedure well without immediate post procedural complication. IMPRESSION: Successful CT guided left iliac bone marrow aspiration and core biopsy. Electronically Signed   By: Sandi Mariscal M.D.   On: 03/17/2018 10:41   Dg Fluoro Guided Loc Of Needle/cath Tip For Spinal Inject Rt  Result Date: 03/21/2018 CLINICAL DATA:  62 year old male with history of high-grade B-cell lymphoma. EXAM: FLUOROSCOPICALLY GUIDED LUMBAR PUNCTURE FOR INTRATHECAL CHEMOTHERAPY TECHNIQUE: Informed consent was obtained from the patient prior to the procedure, including potential complications of headache, allergy, and pain. A 'time out' was performed. With the patient prone, the lower back was prepped with Betadine. 1% Lidocaine was used for local anesthesia. Lumbar puncture was performed at the L2-L3 level using a 20 gauge needle with return of clear CSF. A total of 8 mL of CSF was collected into sterile tubing and sent to the laboratory for testing and analysis per orders of the primary medical team. Subsequently, 5 mL of methotrexate was injected into the subarachnoid space. The patient tolerated the procedure well without apparent complication. FLUOROSCOPY TIME:  48 seconds IMPRESSION: 1. Intrathecal injection of chemotherapy without complication. Electronically  Signed   By: Vinnie Langton M.D.   On: 03/21/2018 14:39   US Scrotum Doppler  Result Date: 03/15/2018 CLINICAL DATA:  Scrotal swelling EXAM: SCROTAL ULTRASOUND DOPPLER ULTRASOUND OF THE TESTICLES TECHNIQUE: Complete ultrasound examination of the testicles, epididymis, and other scrotal structures was performed. Color and spectral Doppler ultrasound were also utilized to evaluate blood flow to the testicles. COMPARISON:  CT 03/10/2018 FINDINGS: Right testicle Measurements: 4.2 x  2.5 x 2.6 cm. No mass or microlithiasis visualized. Left testicle Measurements: 4.2 x 2.5 x 2.7 cm. No mass or microlithiasis visualized. Right epididymis:  Cyst measuring 9 x 8 x 11 mm Left epididymis:  Cyst measuring 6 x 4 mm Hydrocele:  Small left hydrocele.  Moderate right hydrocele. Varicocele:  Small right varicocele. Significant scrotal skin thickening with diffuse edema in the soft tissues Pulsed Doppler interrogation of both testes demonstrates normal low resistance arterial and venous waveforms bilaterally. IMPRESSION: 1. Negative for testicular torsion or intratesticular mass 2. Bilateral hydroceles, small on the left and moderate on the right 3. Small right varicocele 4. Extensive scrotal edema and skin thickening Electronically Signed   By: Donavan Foil M.D.   On: 03/15/2018 20:19   Ct Venogram Abd/pel  Result Date: 03/23/2018 CLINICAL DATA:  Right lower extremity DVT EXAM: CT VENOGRAM ABD-PELVIS TECHNIQUE: Multidetector CT imaging of the abdomen and pelvis was performed using the standard protocol during bolus administration of intravenous contrast. Multiplanar reconstructed images and MIPs were obtained and reviewed to evaluate the venous anatomy. CONTRAST:  17m ISOVUE-300 IOPAMIDOL (ISOVUE-300) INJECTION 61% COMPARISON:  03/10/2018 FINDINGS: VASCULAR Veins: Hepatic veins, portal vein, and splenic vein are patent. Superior mesenteric vein is patent. IVC is patent.  Renal veins are patent. Left common and external  iliac veins are patent. The right common and external iliac veins are patent. There is compression of the right common and external iliac veins secondary to residual retroperitoneal hemorrhage and/or infiltrative mass. There is low-density filling defect in the right common femoral vein compatible with DVT. The right femoral vein is grossly patent. No evidence of left common femoral or femoral DVT. Review of the MIP images confirms the above findings. NON-VASCULAR Lower chest: Bibasilar atelectasis for scarring. Hepatobiliary: Liver is unremarkable.  Gallbladder is decompressed. Pancreas: Unremarkable Spleen: Unremarkable Adrenals/Urinary Tract: Nonspecific hypodensity in the mid right kidney. Left kidney is within normal limits. Right hydronephrosis has nearly resolved. Small calculus in the posterior lower pole of the right kidney is unchanged. Stomach/Bowel: Severe wall thickening of a loop of bowel in the right lower quadrant either represents the terminal ileum or cecum. Wall thickening is very nodular and this is worrisome for adenocarcinoma or lymphoma. There is no evidence of small-bowel obstruction. The appendix is not readily visualized. There is no definite extraluminal bowel gas to suggest perforation. Stomach and duodenum are unremarkable. Lymphatic: There is no abnormal retroperitoneal adenopathy. Mild atherosclerotic calcification of the aorta without aneurysm. Reproductive: Prostate is unremarkable other than calcifications. Other: On the prior study, there was ill-defined and infiltrative soft tissue density throughout the right lower quadrant of the abdomen and extending into the right iliopsoas musculature and right inguinal region. This soft tissue density has largely resolved with some residual stranding throughout the retroperitoneum. The large infiltrative mass is now simply replaced by stranding and ill-defined bowel loops. Expansion of the right iliopsoas musculature has also improved. There  is stranding throughout the musculature of the proximal right thigh which is stable. Due to the dramatic improvement of the infiltrative mass, this may represent a dramatic response to chemotherapy if this correlates with the given history. Alternatively, the ill-defined mass may represent hemorrhage which has evolved. Musculoskeletal: No vertebral compression deformity. IMPRESSION: VASCULAR There is no evidence of DVT in the abdomen or pelvis. Specifically, there is no evidence of right common or external iliac vein DVT. The study is positive for DVT in the right common femoral vein. NON-VASCULAR The large infiltrative mass in the  right lower quadrant has largely improved. The dramatic improvement either represents a rapid response to chemotherapy or possibly evolving hemorrhage. Correlate clinically with vital signs and hemoglobin levels. The right hydronephrosis has also nearly resolved. There is persistent compression of the right common and external iliac veins due to residual retroperitoneal hemorrhage. Circumferential mass in the terminal ileum or cecum worrisome for adenocarcinoma or lymphoma. It is conceivable that the wall thickening may be related to hemorrhage in the wall of bowel, but it is rather nodular suggesting neoplasm. Electronically Signed   By: Marybelle Killings M.D.   On: 03/23/2018 08:17   Ir Imaging Guided Port Insertion  Result Date: 03/17/2018 INDICATION: Recent diagnosis of lymphoma. In need of durable intravenous access for chemotherapy administration. EXAM: IMPLANTED PORT A CATH PLACEMENT WITH ULTRASOUND AND FLUOROSCOPIC GUIDANCE COMPARISON:  CT the chest, abdomen and pelvis-03/10/2018 MEDICATIONS: Ancef 2 gm IV; The antibiotic was administered within an appropriate time interval prior to skin puncture. ANESTHESIA/SEDATION: Moderate (conscious) sedation was employed during this procedure. A total of Versed 0.5 mg and Fentanyl 25 mcg was administered intravenously. Moderate Sedation Time:  26 minutes. The patient's level of consciousness and vital signs were monitored continuously by radiology nursing throughout the procedure under my direct supervision. CONTRAST:  None FLUOROSCOPY TIME:  12 seconds (5 mGy) COMPLICATIONS: None immediate. PROCEDURE: The procedure, risks, benefits, and alternatives were explained to the patient. Questions regarding the procedure were encouraged and answered. The patient understands and consents to the procedure. The right neck and chest were prepped with chlorhexidine in a sterile fashion, and a sterile drape was applied covering the operative field. Maximum barrier sterile technique with sterile gowns and gloves were used for the procedure. A timeout was performed prior to the initiation of the procedure. Local anesthesia was provided with 1% lidocaine with epinephrine. After creating a small venotomy incision, a micropuncture kit was utilized to access the internal jugular vein. Real-time ultrasound guidance was utilized for vascular access including the acquisition of a permanent ultrasound image documenting patency of the accessed vessel. The microwire was utilized to measure appropriate catheter length. A subcutaneous port pocket was then created along the upper chest wall utilizing a combination of sharp and blunt dissection. The pocket was irrigated with sterile saline. A single lumen ISP power injectable port was chosen for placement. The 8 Fr catheter was tunneled from the port pocket site to the venotomy incision. The port was placed in the pocket. The external catheter was trimmed to appropriate length. At the venotomy, an 8 Fr peel-away sheath was placed over a guidewire under fluoroscopic guidance. The catheter was then placed through the sheath and the sheath was removed. Final catheter positioning was confirmed and documented with a fluoroscopic spot radiograph. The port was accessed with a Huber needle and was noted to easily aspirate and flush. The Port  a Catheter was left accessed for the initiation of chemotherapy later today. The venotomy site was closed with an interrupted 4-0 Vicryl suture. The port pocket incision was closed with interrupted 2-0 Vicryl suture and the skin was opposed with a running subcuticular 4-0 Vicryl suture. Dermabond and Steri-strips were applied to both incisions. Dressings were placed. The patient tolerated the procedure well without immediate post procedural complication. FINDINGS: After catheter placement, the tip lies within the superior cavoatrial junction. The catheter aspirates and flushes normally and is ready for immediate use. IMPRESSION: Successful placement of a right internal jugular approach power injectable Port-A-Cath. The catheter is ready for immediate use. Electronically  Signed   By: Sandi Mariscal M.D.   On: 03/17/2018 10:42    ASSESSMENT & PLAN:   62 y.o. male with   1. Burkitt ymphomastage IIAE  bulky disease 03/10/18 CT A/P revealedLarge irregular infiltrative solid mass in the right lower quadrant measuring up to 18.7 x 18.5 x 17.8 cm, infiltrating and encasing multiple distal small bowel loops and likely the ileocecal region, partially encasing the sigmoid colon, with prominent extension into the right lower retroperitoneum and extraperitoneal right pelvis with encasement of right external iliac and proximal right common iliac vasculature and infiltration of the right iliopsoas muscle  PLAN: -labs stable -no prohbitive toxicities from [place] -Proceeding with C2D3 of EPOCH today.- will need to adjust infusion rate to complete chemotherapy before noon on Friday to allow for neulasta at noon on Saturday.   -neulasta as outpatient on 04/15/2018 -Rituxan as outpatient on 04/17/2018 -Will add intrathecal Methotrexate back from C3 to avoid interrupting anticoagulation. -Will discontinue Allopurinol after C2 if no evidence of TLS -Continue eating well, hydrating well and walking around as often as  reasonably possible  2. RLE DVT and PE -continue Lovenox 154m [place] daily  3) GI bleeding related to bowel involvement by lymphoma. -no overt bleeding at this time -continue to monitor. hgb stable.  4) DVT px -- on therapeutic lovenox  5) Constipation - Colace 1 tab po BID as needed for constipation   The total time spent in the appointment was 25 minutes and more than 50% was on counseling and direct patient cares and reviewing and signing chemotherapy orders.     GSullivan LoneMD MS AAHIVMS SUniversity Of California Davis Medical CenterCMarietta Advanced Surgery CenterHematology/Oncology Physician CGunders[facility] (Office):       [phone](Work cell):  [phone](Fax):           [phone] 04/12/2018 8:32 AM  I, SBaldwin [place] am acting as a scribe for Dr. KIrene Limbo  .I have reviewed the above documentation for accuracy and completeness, and I agree with the above. GSullivan LoneMD MS

## 2018-04-12 NOTE — Progress Notes (Signed)
Pt requested Colace. There were no orders for scheduled or PRN Colace. Nurse called Dr. Grier Mitts work cell and left a voicemail regarding the patient's request. Asked for a call back.  Birdie Hopes, RN 04/12/18

## 2018-04-13 ENCOUNTER — Ambulatory Visit: Payer: BLUE CROSS/BLUE SHIELD

## 2018-04-13 LAB — CBC
HEMATOCRIT: 30.2 % — AB (ref 39.0–52.0)
HEMOGLOBIN: 9.8 g/dL — AB (ref 13.0–17.0)
MCH: 28.8 pg (ref 26.0–34.0)
MCHC: 32.5 g/dL (ref 30.0–36.0)
MCV: 88.8 fL (ref 78.0–100.0)
Platelets: 497 10*3/uL — ABNORMAL HIGH (ref 150–400)
RBC: 3.4 MIL/uL — AB (ref 4.22–5.81)
RDW: 17.2 % — ABNORMAL HIGH (ref 11.5–15.5)
WBC: 5.9 10*3/uL (ref 4.0–10.5)

## 2018-04-13 LAB — COMPREHENSIVE METABOLIC PANEL
ALBUMIN: 3.1 g/dL — AB (ref 3.5–5.0)
ALK PHOS: 38 U/L (ref 38–126)
ALT: 21 U/L (ref 0–44)
AST: 17 U/L (ref 15–41)
Anion gap: 8 (ref 5–15)
BUN: 18 mg/dL (ref 8–23)
CALCIUM: 9.1 mg/dL (ref 8.9–10.3)
CO2: 26 mmol/L (ref 22–32)
CREATININE: 0.59 mg/dL — AB (ref 0.61–1.24)
Chloride: 107 mmol/L (ref 98–111)
GFR calc Af Amer: >60 mL/min (ref >60)
GFR calc non Af Amer: >60 mL/min (ref >60)
GLUCOSE: 175 mg/dL — AB (ref 70–99)
Potassium: 3.9 mmol/L (ref 3.5–5.1)
SODIUM: 141 mmol/L (ref 135–145)
Total Bilirubin: 0.4 mg/dL (ref 0.3–1.2)
Total Protein: 5.9 g/dL — ABNORMAL LOW (ref 6.5–8.1)

## 2018-04-13 LAB — URIC ACID: Uric Acid, Serum: 4 mg/dL (ref 3.7–8.6)

## 2018-04-13 MED ORDER — VINCRISTINE SULFATE CHEMO INJECTION 1 MG/ML
Freq: Once | INTRAVENOUS | Status: AC
  Administered 2018-04-13: 13:00:00 via INTRAVENOUS
  Filled 2018-04-13 (×2): qty 12

## 2018-04-13 MED ORDER — SODIUM CHLORIDE 0.9 % IV SOLN
Freq: Once | INTRAVENOUS | Status: AC
  Administered 2018-04-13: 8 mg via INTRAVENOUS
  Filled 2018-04-13: qty 4

## 2018-04-13 MED ORDER — SENNOSIDES-DOCUSATE SODIUM 8.6-50 MG PO TABS
2.0000 | ORAL_TABLET | Freq: Every day | ORAL | Status: DC
  Administered 2018-04-13: 2 via ORAL
  Filled 2018-04-13: qty 2

## 2018-04-13 NOTE — Progress Notes (Signed)
HEMATOLOGY/ONCOLOGY INPATIENT PROGRESS NOTE  Date of Service: 04/13/2018  Inpatient Attending: .[name], MD   SUBJECTIVE:   Mark Frey is accompanied today by his wife. The pt reports that he is doing well overall and has no acute new symptoms. No nausea. No abdominal pain.  Mild constipation- started on senna-s Looking forward to a seafood dinner that his wife brought him. Leg swelling in RLE stable. No pain. No CP no SOB.  Lab results today (04/13/18) of CBC is as follows: all values are WNL except for RBC at 3.40, HGB at 9.8, HCT at 30.2, RDW at 17.2, PLT at 497k.    OBJECTIVE:  NAD PHYSICAL EXAMINATION: . Vitals:   04/11/18 2206 04/12/18 0641 04/12/18 2046 04/13/18 0500  BP: 138/83 136/85 137/84 132/84  Pulse: (!) 105 90 94 96  Resp: 18 20 16 18   Temp: 98.2 F (36.8 C) 98 F (36.7 C) 98.1 F (36.7 C) 98.3 F (36.8 C)  TempSrc: Oral Oral Oral Oral  SpO2: 97% 98% 98% 96%  . GENERAL:alert, in no acute distress and comfortable SKIN: no acute rashes, no significant lesions EYES: conjunctiva are pink and non-injected, sclera anicteric OROPHARYNX: MMM, no exudates, no oropharyngeal erythema or ulceration NECK: supple, no JVD LYMPH:  no palpable lymphadenopathy in the cervical, axillary or inguinal regions LUNGS: clear to auscultation b/l with normal respiratory effort HEART: regular rate & rhythm ABDOMEN:  normoactive bowel sounds , non tender, not distended. Extremity: Grade 1-2 rt pedal edema PSYCH: alert & oriented x 3 with fluent speech NEURO: no focal motor/sensory deficits  MEDICAL HISTORY:  Past Medical History:  Diagnosis Date  . ALLERGIC RHINITIS   . Diabetes mellitus   . Hyperlipidemia     SURGICAL HISTORY: Past Surgical History:  Procedure Laterality Date  . BIOPSY  03/15/2018   Procedure: BIOPSY;  Surgeon: Milus Banister, MD;  Location: [facility];  Service: Endoscopy;;  . CLEFT PALATE REPAIR    . COLONOSCOPY N/A 03/15/2018     Procedure: COLONOSCOPY;  Surgeon: Milus Banister, MD;  Location: [facility];  Service: Endoscopy;  Laterality: N/A;  . deviated septum repair     slight improvement  . ESOPHAGOGASTRODUODENOSCOPY N/A 03/15/2018   Procedure: ESOPHAGOGASTRODUODENOSCOPY (EGD);  Surgeon: Milus Banister, MD;  Location: [name];  Service: Endoscopy;  Laterality: N/A;  . IR IMAGING GUIDED PORT INSERTION  03/17/2018  . TONSILLECTOMY      SOCIAL HISTORY: Social History   Socioeconomic History  . Marital status: Married    Spouse name: Not on file  . Number of children: Not on file  . Years of education: Not on file  . Highest education level: Not on file  Occupational History  . Not on file  Social Needs  . Financial resource strain: Not on file  . Food insecurity:    Worry: Not on file    Inability: Not on file  . Transportation needs:    Medical: Not on file    Non-medical: Not on file  Tobacco Use  . Smoking status: Never Smoker  . Smokeless tobacco: Never Used  Substance and Sexual Activity  . Alcohol use: Yes    Comment: occasional  . Drug use: No  . Sexual activity: Yes  Lifestyle  . Physical activity:    Days per week: Not on file    Minutes per session: Not on file  . Stress: Not on file  Relationships  . Social connections:    Talks on phone:  Not on file    Gets together: Not on file    Attends religious service: Not on file    Active member of club or organization: Not on file    Attends meetings of clubs or organizations: Not on file    Relationship status: Not on file  . Intimate partner violence:    Fear of current or ex partner: Not on file    Emotionally abused: Not on file    Physically abused: Not on file    Forced sexual activity: Not on file  Other Topics Concern  . Not on file  Social History Narrative   Married 1985. No kids. 4 small dogs.       Works in Financial trader, residential      Hobbies: work on cars, Haematologist, exercise as able     FAMILY HISTORY: Family History  Problem Relation Age of Onset  . Lung cancer Mother        smoker  . Brain cancer Mother        metastasis  . AAA (abdominal aortic aneurysm) Father        smoker    ALLERGIES:  is allergic to ciprofloxacin.  MEDICATIONS:  Scheduled Meds: . allopurinol  300 mg Oral QPM  . DOXOrubicin/vinCRIStine/etoposide CHEMO IV infusion for Inpatient CI   Intravenous Once  . DOXOrubicin/vinCRIStine/etoposide CHEMO IV infusion for Inpatient CI   Intravenous Once  . enoxaparin  150 mg Subcutaneous Q24H  . linagliptin  5 mg Oral Q breakfast  . metFORMIN  1,000 mg Oral BID WC  . predniSONE  30 mg Oral BID WC   Continuous Infusions: . sodium chloride 20 mL/hr (04/12/18 0931)  . ondansetron (ZOFRAN) with dexamethasone (DECADRON) IV     PRN Meds:.acetaminophen, alteplase, docusate sodium, heparin lock flush, heparin lock flush, sodium chloride flush, sodium chloride flush  REVIEW OF SYSTEMS:    A 10+ POINT REVIEW OF SYSTEMS WAS OBTAINED including neurology, dermatology, psychiatry, cardiac, respiratory, lymph, extremities, GI, GU, Musculoskeletal, constitutional, breasts, reproductive, HEENT.  All pertinent positives are noted in the HPI.  All others are negative.    LABORATORY DATA:  I have reviewed the data as listed  CBC Latest Ref Rng & Units 04/14/2018 04/13/2018 04/12/2018  WBC 4.0 - 10.5 K/uL 5.8 5.9 21.0(H)  Hemoglobin 13.0 - 17.0 g/dL 9.9(L) 9.8(L) 10.3(L)  Hematocrit 39.0 - 52.0 % 30.0(L) 30.2(L) 31.5(L)  Platelets 150 - 400 K/uL 493(H) 497(H) 583(H)     CMP Latest Ref Rng & Units 04/13/2018 04/12/2018 04/11/2018  Glucose 70 - 99 mg/dL 175(H) 166(H) 197(H)  BUN 8 - 23 mg/dL 18 22 15   Creatinine 0.61 - 1.24 mg/dL 0.59(L) 0.77 0.72  Sodium 135 - 145 mmol/L 141 139 140  Potassium 3.5 - 5.1 mmol/L 3.9 4.3 4.7  Chloride 98 - 111 mmol/L 107 106 107  CO2 22 - 32 mmol/L 26 24 25   Calcium 8.9 - 10.3 mg/dL 9.1 9.2 9.8  Total Protein 6.5 - 8.1 g/dL 5.9(L)  6.3(L) 6.7  Total Bilirubin 0.3 - 1.2 mg/dL 0.4 0.5 0.5  Alkaline Phos 38 - 126 U/L 38 44 48  AST 15 - 41 U/L 17 16 18   ALT 0 - 44 U/L 21 19 19     Uric acid 4  RADIOGRAPHIC STUDIES: I have personally reviewed the radiological images as listed and agreed with the findings in the report.  US Scrotum  Result Date: 03/15/2018 CLINICAL DATA:  Scrotal swelling EXAM: SCROTAL ULTRASOUND DOPPLER ULTRASOUND OF THE TESTICLES  TECHNIQUE: Complete ultrasound examination of the testicles, epididymis, and other scrotal structures was performed. Color and spectral Doppler ultrasound were also utilized to evaluate blood flow to the testicles. COMPARISON:  CT 03/10/2018 FINDINGS: Right testicle Measurements: 4.2 x 2.5 x 2.6 cm. No mass or microlithiasis visualized. Left testicle Measurements: 4.2 x 2.5 x 2.7 cm. No mass or microlithiasis visualized. Right epididymis:  Cyst measuring 9 x 8 x 11 mm Left epididymis:  Cyst measuring 6 x 4 mm Hydrocele:  Small left hydrocele.  Moderate right hydrocele. Varicocele:  Small right varicocele. Significant scrotal skin thickening with diffuse edema in the soft tissues Pulsed Doppler interrogation of both testes demonstrates normal low resistance arterial and venous waveforms bilaterally. IMPRESSION: 1. Negative for testicular torsion or intratesticular mass 2. Bilateral hydroceles, small on the left and moderate on the right 3. Small right varicocele 4. Extensive scrotal edema and skin thickening Electronically Signed   By: Donavan Foil M.D.   On: 03/15/2018 20:19   Ct Biopsy  Result Date: 03/17/2018 INDICATION: Recent diagnosis of lymphoma. Please perform CT-guided bone marrow biopsy for tissue diagnostic purposes. EXAM: CT-GUIDED BONE MARROW BIOPSY AND ASPIRATION MEDICATIONS: None ANESTHESIA/SEDATION: Fentanyl 100 mcg IV; Versed 2 mg IV Sedation Time: 10 Minutes; The patient was continuously monitored during the procedure by the interventional radiology nurse under my direct  supervision. COMPLICATIONS: None immediate. PROCEDURE: Informed consent was obtained from the patient following an explanation of the procedure, risks, benefits and alternatives. The patient understands, agrees and consents for the procedure. All questions were addressed. A time out was performed prior to the initiation of the procedure. The patient was positioned left lateral decubitus and non-contrast localization CT was performed of the pelvis to demonstrate the iliac marrow spaces. The operative site was prepped and draped in the usual sterile fashion. Under sterile conditions and local anesthesia, a 22 gauge spinal needle was utilized for procedural planning. Next, an 11 gauge coaxial bone biopsy needle was advanced into the left iliac marrow space. Needle position was confirmed with CT imaging. Initially, bone marrow aspiration was performed. Next, a bone marrow biopsy was obtained with the 11 gauge outer bone marrow device. Samples were prepared with the cytotechnologist and deemed adequate. The needle was removed intact. Hemostasis was obtained with compression and a dressing was placed. The patient tolerated the procedure well without immediate post procedural complication. IMPRESSION: Successful CT guided left iliac bone marrow aspiration and core biopsy. Electronically Signed   By: Sandi Mariscal M.D.   On: 03/17/2018 10:41   Ct Bone Marrow Biopsy  Result Date: 03/17/2018 INDICATION: Recent diagnosis of lymphoma. Please perform CT-guided bone marrow biopsy for tissue diagnostic purposes. EXAM: CT-GUIDED BONE MARROW BIOPSY AND ASPIRATION MEDICATIONS: None ANESTHESIA/SEDATION: Fentanyl 100 mcg IV; Versed 2 mg IV Sedation Time: 10 Minutes; The patient was continuously monitored during the procedure by the interventional radiology nurse under my direct supervision. COMPLICATIONS: None immediate. PROCEDURE: Informed consent was obtained from the patient following an explanation of the procedure, risks,  benefits and alternatives. The patient understands, agrees and consents for the procedure. All questions were addressed. A time out was performed prior to the initiation of the procedure. The patient was positioned left lateral decubitus and non-contrast localization CT was performed of the pelvis to demonstrate the iliac marrow spaces. The operative site was prepped and draped in the usual sterile fashion. Under sterile conditions and local anesthesia, a 22 gauge spinal needle was utilized for procedural planning. Next, an 11 gauge coaxial bone biopsy  needle was advanced into the left iliac marrow space. Needle position was confirmed with CT imaging. Initially, bone marrow aspiration was performed. Next, a bone marrow biopsy was obtained with the 11 gauge outer bone marrow device. Samples were prepared with the cytotechnologist and deemed adequate. The needle was removed intact. Hemostasis was obtained with compression and a dressing was placed. The patient tolerated the procedure well without immediate post procedural complication. IMPRESSION: Successful CT guided left iliac bone marrow aspiration and core biopsy. Electronically Signed   By: Sandi Mariscal M.D.   On: 03/17/2018 10:41   Dg Fluoro Guided Loc Of Needle/cath Tip For Spinal Inject Rt  Result Date: 03/21/2018 CLINICAL DATA:  62 year old male with history of high-grade B-cell lymphoma. EXAM: FLUOROSCOPICALLY GUIDED LUMBAR PUNCTURE FOR INTRATHECAL CHEMOTHERAPY TECHNIQUE: Informed consent was obtained from the patient prior to the procedure, including potential complications of headache, allergy, and pain. A 'time out' was performed. With the patient prone, the lower back was prepped with Betadine. 1% Lidocaine was used for local anesthesia. Lumbar puncture was performed at the L2-L3 level using a 20 gauge needle with return of clear CSF. A total of 8 mL of CSF was collected into sterile tubing and sent to the laboratory for testing and analysis per orders  of the primary medical team. Subsequently, 5 mL of methotrexate was injected into the subarachnoid space. The patient tolerated the procedure well without apparent complication. FLUOROSCOPY TIME:  48 seconds IMPRESSION: 1. Intrathecal injection of chemotherapy without complication. Electronically Signed   By: Vinnie Langton M.D.   On: 03/21/2018 14:39   US Scrotum Doppler  Result Date: 03/15/2018 CLINICAL DATA:  Scrotal swelling EXAM: SCROTAL ULTRASOUND DOPPLER ULTRASOUND OF THE TESTICLES TECHNIQUE: Complete ultrasound examination of the testicles, epididymis, and other scrotal structures was performed. Color and spectral Doppler ultrasound were also utilized to evaluate blood flow to the testicles. COMPARISON:  CT 03/10/2018 FINDINGS: Right testicle Measurements: 4.2 x 2.5 x 2.6 cm. No mass or microlithiasis visualized. Left testicle Measurements: 4.2 x 2.5 x 2.7 cm. No mass or microlithiasis visualized. Right epididymis:  Cyst measuring 9 x 8 x 11 mm Left epididymis:  Cyst measuring 6 x 4 mm Hydrocele:  Small left hydrocele.  Moderate right hydrocele. Varicocele:  Small right varicocele. Significant scrotal skin thickening with diffuse edema in the soft tissues Pulsed Doppler interrogation of both testes demonstrates normal low resistance arterial and venous waveforms bilaterally. IMPRESSION: 1. Negative for testicular torsion or intratesticular mass 2. Bilateral hydroceles, small on the left and moderate on the right 3. Small right varicocele 4. Extensive scrotal edema and skin thickening Electronically Signed   By: Donavan Foil M.D.   On: 03/15/2018 20:19   Ct Venogram Abd/pel  Result Date: 03/23/2018 CLINICAL DATA:  Right lower extremity DVT EXAM: CT VENOGRAM ABD-PELVIS TECHNIQUE: Multidetector CT imaging of the abdomen and pelvis was performed using the standard protocol during bolus administration of intravenous contrast. Multiplanar reconstructed images and MIPs were obtained and reviewed to  evaluate the venous anatomy. CONTRAST:  174m ISOVUE-300 IOPAMIDOL (ISOVUE-300) INJECTION 61% COMPARISON:  03/10/2018 FINDINGS: VASCULAR Veins: Hepatic veins, portal vein, and splenic vein are patent. Superior mesenteric vein is patent. IVC is patent.  Renal veins are patent. Left common and external iliac veins are patent. The right common and external iliac veins are patent. There is compression of the right common and external iliac veins secondary to residual retroperitoneal hemorrhage and/or infiltrative mass. There is low-density filling defect in the right common femoral vein  compatible with DVT. The right femoral vein is grossly patent. No evidence of left common femoral or femoral DVT. Review of the MIP images confirms the above findings. NON-VASCULAR Lower chest: Bibasilar atelectasis for scarring. Hepatobiliary: Liver is unremarkable.  Gallbladder is decompressed. Pancreas: Unremarkable Spleen: Unremarkable Adrenals/Urinary Tract: Nonspecific hypodensity in the mid right kidney. Left kidney is within normal limits. Right hydronephrosis has nearly resolved. Small calculus in the posterior lower pole of the right kidney is unchanged. Stomach/Bowel: Severe wall thickening of a loop of bowel in the right lower quadrant either represents the terminal ileum or cecum. Wall thickening is very nodular and this is worrisome for adenocarcinoma or lymphoma. There is no evidence of small-bowel obstruction. The appendix is not readily visualized. There is no definite extraluminal bowel gas to suggest perforation. Stomach and duodenum are unremarkable. Lymphatic: There is no abnormal retroperitoneal adenopathy. Mild atherosclerotic calcification of the aorta without aneurysm. Reproductive: Prostate is unremarkable other than calcifications. Other: On the prior study, there was ill-defined and infiltrative soft tissue density throughout the right lower quadrant of the abdomen and extending into the right iliopsoas  musculature and right inguinal region. This soft tissue density has largely resolved with some residual stranding throughout the retroperitoneum. The large infiltrative mass is now simply replaced by stranding and ill-defined bowel loops. Expansion of the right iliopsoas musculature has also improved. There is stranding throughout the musculature of the proximal right thigh which is stable. Due to the dramatic improvement of the infiltrative mass, this may represent a dramatic response to chemotherapy if this correlates with the given history. Alternatively, the ill-defined mass may represent hemorrhage which has evolved. Musculoskeletal: No vertebral compression deformity. IMPRESSION: VASCULAR There is no evidence of DVT in the abdomen or pelvis. Specifically, there is no evidence of right common or external iliac vein DVT. The study is positive for DVT in the right common femoral vein. NON-VASCULAR The large infiltrative mass in the right lower quadrant has largely improved. The dramatic improvement either represents a rapid response to chemotherapy or possibly evolving hemorrhage. Correlate clinically with vital signs and hemoglobin levels. The right hydronephrosis has also nearly resolved. There is persistent compression of the right common and external iliac veins due to residual retroperitoneal hemorrhage. Circumferential mass in the terminal ileum or cecum worrisome for adenocarcinoma or lymphoma. It is conceivable that the wall thickening may be related to hemorrhage in the wall of bowel, but it is rather nodular suggesting neoplasm. Electronically Signed   By: Marybelle Killings M.D.   On: 03/23/2018 08:17   Ir Imaging Guided Port Insertion  Result Date: 03/17/2018 INDICATION: Recent diagnosis of lymphoma. In need of durable intravenous access for chemotherapy administration. EXAM: IMPLANTED PORT A CATH PLACEMENT WITH ULTRASOUND AND FLUOROSCOPIC GUIDANCE COMPARISON:  CT the chest, abdomen and pelvis-03/10/2018  MEDICATIONS: Ancef 2 gm IV; The antibiotic was administered within an appropriate time interval prior to skin puncture. ANESTHESIA/SEDATION: Moderate (conscious) sedation was employed during this procedure. A total of Versed 0.5 mg and Fentanyl 25 mcg was administered intravenously. Moderate Sedation Time: 26 minutes. The patient's level of consciousness and vital signs were monitored continuously by radiology nursing throughout the procedure under my direct supervision. CONTRAST:  None FLUOROSCOPY TIME:  12 seconds (5 mGy) COMPLICATIONS: None immediate. PROCEDURE: The procedure, risks, benefits, and alternatives were explained to the patient. Questions regarding the procedure were encouraged and answered. The patient understands and consents to the procedure. The right neck and chest were prepped with chlorhexidine in a sterile fashion,  and a sterile drape was applied covering the operative field. Maximum barrier sterile technique with sterile gowns and gloves were used for the procedure. A timeout was performed prior to the initiation of the procedure. Local anesthesia was provided with 1% lidocaine with epinephrine. After creating a small venotomy incision, a micropuncture kit was utilized to access the internal jugular vein. Real-time ultrasound guidance was utilized for vascular access including the acquisition of a permanent ultrasound image documenting patency of the accessed vessel. The microwire was utilized to measure appropriate catheter length. A subcutaneous port pocket was then created along the upper chest wall utilizing a combination of sharp and blunt dissection. The pocket was irrigated with sterile saline. A single lumen ISP power injectable port was chosen for placement. The 8 Fr catheter was tunneled from the port pocket site to the venotomy incision. The port was placed in the pocket. The external catheter was trimmed to appropriate length. At the venotomy, an 8 Fr peel-away sheath was placed  over a guidewire under fluoroscopic guidance. The catheter was then placed through the sheath and the sheath was removed. Final catheter positioning was confirmed and documented with a fluoroscopic spot radiograph. The port was accessed with a Huber needle and was noted to easily aspirate and flush. The Port a Catheter was left accessed for the initiation of chemotherapy later today. The venotomy site was closed with an interrupted 4-0 Vicryl suture. The port pocket incision was closed with interrupted 2-0 Vicryl suture and the skin was opposed with a running subcuticular 4-0 Vicryl suture. Dermabond and Steri-strips were applied to both incisions. Dressings were placed. The patient tolerated the procedure well without immediate post procedural complication. FINDINGS: After catheter placement, the tip lies within the superior cavoatrial junction. The catheter aspirates and flushes normally and is ready for immediate use. IMPRESSION: Successful placement of a right internal jugular approach power injectable Port-A-Cath. The catheter is ready for immediate use. Electronically Signed   By: Sandi Mariscal M.D.   On: 03/17/2018 10:42    ASSESSMENT & PLAN:   62 y.o. male with   1. Burkitt ymphomastage IIAE  bulky disease 03/10/18 CT A/P revealedLarge irregular infiltrative solid mass in the right lower quadrant measuring up to 18.7 x 18.5 x 17.8 cm, infiltrating and encasing multiple distal small bowel loops and likely the ileocecal region, partially encasing the sigmoid colon, with prominent extension into the right lower retroperitoneum and extraperitoneal right pelvis with encasement of right external iliac and proximal right common iliac vasculature and infiltration of the right iliopsoas muscle  PLAN: -labs stable -no prohbitive toxicities from [facility] C2D3 -Proceeding with C2D4 of EPOCH today.- will need to adjust infusion rate to complete chemotherapy before noon on Friday to allow for neulasta at noon  on Saturday.   -neulasta as outpatient on 04/15/2018 -Rituxan as outpatient on 04/17/2018 -Will add intrathecal Methotrexate back from C3 to avoid interrupting anticoagulation. -Will discontinue Allopurinol after C2 if no evidence of TLS -Continue eating well, hydrating well and walking around as often as reasonably possible  2. RLE DVT and PE -continue Lovenox 151m Mason daily  3) GI bleeding related to bowel involvement by lymphoma. -no overt bleeding at this time -continue to monitor. hgb slightly drifting down possibly from chemotherapy. No overt bleeding. Will monitor.  4) DVT px -- on therapeutic lovenox  5) Constipation - senna s 2 tab po HS  The total time spent in the appt was 25 minutes and more than 50% was on counseling and  direct patient cares.     Sullivan Lone MD MS AAHIVMS [facility] Hematology/Oncology Physician [facility]  (Office):       [phone] (Work cell):  [phone] (Fax):           [phone]  04/13/2018 12:31 PM  I, Baldwin [place], am acting as a scribe for [name]  .I have reviewed the above documentation for accuracy and completeness, and I agree with the above. Sullivan Lone MD MS

## 2018-04-14 ENCOUNTER — Other Ambulatory Visit: Payer: BLUE CROSS/BLUE SHIELD

## 2018-04-14 ENCOUNTER — Ambulatory Visit: Payer: BLUE CROSS/BLUE SHIELD | Admitting: Nurse Practitioner

## 2018-04-14 ENCOUNTER — Ambulatory Visit: Payer: BLUE CROSS/BLUE SHIELD

## 2018-04-14 ENCOUNTER — Telehealth: Payer: Self-pay | Admitting: Hematology

## 2018-04-14 LAB — CBC
HEMATOCRIT: 30 % — AB (ref 39.0–52.0)
HEMOGLOBIN: 9.9 g/dL — AB (ref 13.0–17.0)
MCH: 28.8 pg (ref 26.0–34.0)
MCHC: 33 g/dL (ref 30.0–36.0)
MCV: 87.2 fL (ref 78.0–100.0)
Platelets: 493 10*3/uL — ABNORMAL HIGH (ref 150–400)
RBC: 3.44 MIL/uL — ABNORMAL LOW (ref 4.22–5.81)
RDW: 16.8 % — ABNORMAL HIGH (ref 11.5–15.5)
WBC: 5.8 10*3/uL (ref 4.0–10.5)

## 2018-04-14 LAB — URIC ACID: Uric Acid, Serum: 3.9 mg/dL (ref 3.7–8.6)

## 2018-04-14 LAB — COMPREHENSIVE METABOLIC PANEL
ALBUMIN: 3 g/dL — AB (ref 3.5–5.0)
ALT: 32 U/L (ref 0–44)
AST: 22 U/L (ref 15–41)
Alkaline Phosphatase: 39 U/L (ref 38–126)
Anion gap: 7 (ref 5–15)
BILIRUBIN TOTAL: 0.4 mg/dL (ref 0.3–1.2)
BUN: 14 mg/dL (ref 8–23)
CO2: 27 mmol/L (ref 22–32)
CREATININE: 0.59 mg/dL — AB (ref 0.61–1.24)
Calcium: 8.9 mg/dL (ref 8.9–10.3)
Chloride: 106 mmol/L (ref 98–111)
GFR calc Af Amer: >60 mL/min (ref >60)
GLUCOSE: 185 mg/dL — AB (ref 70–99)
Potassium: 3.8 mmol/L (ref 3.5–5.1)
Sodium: 140 mmol/L (ref 135–145)
TOTAL PROTEIN: 5.7 g/dL — AB (ref 6.5–8.1)

## 2018-04-14 MED ORDER — SODIUM CHLORIDE 0.9 % IV SOLN
750.0000 mg/m2 | Freq: Once | INTRAVENOUS | Status: AC
  Administered 2018-04-14: 1800 mg via INTRAVENOUS
  Filled 2018-04-14: qty 90

## 2018-04-14 MED ORDER — SENNOSIDES-DOCUSATE SODIUM 8.6-50 MG PO TABS: 2.0000 | ORAL_TABLET | Freq: Every day | ORAL | 1 refills | Status: DC

## 2018-04-14 MED ORDER — SODIUM CHLORIDE 0.9 % IV SOLN
Freq: Once | INTRAVENOUS | Status: AC
  Administered 2018-04-14: 36 mg via INTRAVENOUS
  Filled 2018-04-14: qty 8

## 2018-04-14 NOTE — Progress Notes (Signed)
Pt discharged home with spouse in stable condition.  Discharge instructions given. Script sent to pharmacy of choice. Pt and spouse verbalized understanding. Discharged from unit via wheelchair

## 2018-04-14 NOTE — Discharge Summary (Signed)
[place]  [phone] [phone]    Physician Discharge Summary     Patient ID: Mark Frey MRN: [id number] DOB/AGE: July 29, 1956 62 y.o.  Admit date: 04/10/2018 Discharge date: 04/14/2018  Primary Care Physician:  [name], MD   Discharge Diagnoses:    Present on Admission: . 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   allopurinol 300 MG tablet Commonly known as:  ZYLOPRIM Take 1 tablet (300 mg total) by mouth daily. What changed:  when to take this   blood glucose meter kit and supplies Dispense based on patient and insurance preference. Use daily as directed. (E11.9).   Choline Fenofibrate 135 MG capsule TAKE 1 CAPSULE DAILY What changed:    how much to take  how to take this  when to take this   enoxaparin 150 MG/ML injection Commonly known as:  LOVENOX Inject 1 mL (150 mg total) into the skin daily. What changed:  additional instructions   glucose blood test strip Commonly known as:  FREESTYLE TEST STRIPS Use to check blood sugar daily   JANUMET 50-1000 MG tablet Generic drug:  sitaGLIPtin-metformin TAKE 1 TABLET TWICE DAILY  WITH MEALS   lidocaine-prilocaine cream Commonly known as:  EMLA Apply 1 application topically as needed. What changed:    when to take this  reasons to take this   ondansetron 8 MG tablet Commonly known as:  ZOFRAN Take 1 tablet (8 mg total) by mouth 2 (two) times daily as needed (Nausea or vomiting).   polyethylene  glycol packet Commonly known as:  MIRALAX / GLYCOLAX Take 17 g by mouth daily as needed for mild constipation.   prochlorperazine 10 MG tablet Commonly known as:  COMPAZINE Take 1 tablet (10 mg total) by mouth every 6 (six) hours as needed (Nausea or vomiting).   senna-docusate 8.6-50 MG tablet Commonly known as:  Senokot-S Take 2 tablets by mouth at bedtime.        Disposition and Follow-up:   Significant Diagnostic Studies:  US Scrotum  Result Date: 03/15/2018 CLINICAL DATA:  Scrotal swelling EXAM: SCROTAL ULTRASOUND DOPPLER ULTRASOUND OF THE TESTICLES TECHNIQUE: Complete ultrasound examination of the testicles, epididymis, and other scrotal structures was performed. Color and spectral Doppler ultrasound were also utilized to evaluate blood flow to the testicles. COMPARISON:  CT 03/10/2018 FINDINGS: Right testicle Measurements: 4.2 x 2.5 x 2.6 cm. No mass or microlithiasis visualized. Left testicle Measurements: 4.2 x 2.5 x 2.7 cm. No mass or microlithiasis visualized. Right epididymis:  Cyst measuring 9 x 8 x 11 mm Left epididymis:  Cyst measuring 6 x 4 mm Hydrocele:  Small left hydrocele.  Moderate right hydrocele. Varicocele:  Small right varicocele. Significant scrotal skin thickening with diffuse edema in the soft tissues Pulsed Doppler interrogation of both testes demonstrates normal low resistance arterial and venous waveforms bilaterally. IMPRESSION: 1. Negative for testicular torsion or intratesticular mass 2. Bilateral hydroceles, small on the left and moderate  on the right 3. Small right varicocele 4. Extensive scrotal edema and skin thickening Electronically Signed   By: Donavan Foil M.D.   On: 03/15/2018 20:19   Ct Biopsy  Result Date: 03/17/2018 INDICATION: Recent diagnosis of lymphoma. Please perform CT-guided bone marrow biopsy for tissue diagnostic purposes. EXAM: CT-GUIDED BONE MARROW BIOPSY AND ASPIRATION MEDICATIONS: None ANESTHESIA/SEDATION: Fentanyl 100 mcg IV; Versed  2 mg IV Sedation Time: 10 Minutes; The patient was continuously monitored during the procedure by the interventional radiology nurse under my direct supervision. COMPLICATIONS: None immediate. PROCEDURE: Informed consent was obtained from the patient following an explanation of the procedure, risks, benefits and alternatives. The patient understands, agrees and consents for the procedure. All questions were addressed. A time out was performed prior to the initiation of the procedure. The patient was positioned left lateral decubitus and non-contrast localization CT was performed of the pelvis to demonstrate the iliac marrow spaces. The operative site was prepped and draped in the usual sterile fashion. Under sterile conditions and local anesthesia, a 22 gauge spinal needle was utilized for procedural planning. Next, an 11 gauge coaxial bone biopsy needle was advanced into the left iliac marrow space. Needle position was confirmed with CT imaging. Initially, bone marrow aspiration was performed. Next, a bone marrow biopsy was obtained with the 11 gauge outer bone marrow device. Samples were prepared with the cytotechnologist and deemed adequate. The needle was removed intact. Hemostasis was obtained with compression and a dressing was placed. The patient tolerated the procedure well without immediate post procedural complication. IMPRESSION: Successful CT guided left iliac bone marrow aspiration and core biopsy. Electronically Signed   By: Sandi Mariscal M.D.   On: 03/17/2018 10:41   Ct Bone Marrow Biopsy  Result Date: 03/17/2018 INDICATION: Recent diagnosis of lymphoma. Please perform CT-guided bone marrow biopsy for tissue diagnostic purposes. EXAM: CT-GUIDED BONE MARROW BIOPSY AND ASPIRATION MEDICATIONS: None ANESTHESIA/SEDATION: Fentanyl 100 mcg IV; Versed 2 mg IV Sedation Time: 10 Minutes; The patient was continuously monitored during the procedure by the interventional radiology nurse under my direct  supervision. COMPLICATIONS: None immediate. PROCEDURE: Informed consent was obtained from the patient following an explanation of the procedure, risks, benefits and alternatives. The patient understands, agrees and consents for the procedure. All questions were addressed. A time out was performed prior to the initiation of the procedure. The patient was positioned left lateral decubitus and non-contrast localization CT was performed of the pelvis to demonstrate the iliac marrow spaces. The operative site was prepped and draped in the usual sterile fashion. Under sterile conditions and local anesthesia, a 22 gauge spinal needle was utilized for procedural planning. Next, an 11 gauge coaxial bone biopsy needle was advanced into the left iliac marrow space. Needle position was confirmed with CT imaging. Initially, bone marrow aspiration was performed. Next, a bone marrow biopsy was obtained with the 11 gauge outer bone marrow device. Samples were prepared with the cytotechnologist and deemed adequate. The needle was removed intact. Hemostasis was obtained with compression and a dressing was placed. The patient tolerated the procedure well without immediate post procedural complication. IMPRESSION: Successful CT guided left iliac bone marrow aspiration and core biopsy. Electronically Signed   By: Sandi Mariscal M.D.   On: 03/17/2018 10:41   Dg Fluoro Guided Loc Of Needle/cath Tip For Spinal Inject Rt  Result Date: 03/21/2018 CLINICAL DATA:  62 year old male with history of high-grade B-cell lymphoma. EXAM: FLUOROSCOPICALLY GUIDED LUMBAR PUNCTURE FOR INTRATHECAL CHEMOTHERAPY TECHNIQUE: Informed consent was obtained from  the patient prior to the procedure, including potential complications of headache, allergy, and pain. A 'time out' was performed. With the patient prone, the lower back was prepped with Betadine. 1% Lidocaine was used for local anesthesia. Lumbar puncture was performed at the L2-L3 level using a 20 gauge  needle with return of clear CSF. A total of 8 mL of CSF was collected into sterile tubing and sent to the laboratory for testing and analysis per orders of the primary medical team. Subsequently, 5 mL of methotrexate was injected into the subarachnoid space. The patient tolerated the procedure well without apparent complication. FLUOROSCOPY TIME:  48 seconds IMPRESSION: 1. Intrathecal injection of chemotherapy without complication. Electronically Signed   By: Vinnie Langton M.D.   On: 03/21/2018 14:39   US Scrotum Doppler  Result Date: 03/15/2018 CLINICAL DATA:  Scrotal swelling EXAM: SCROTAL ULTRASOUND DOPPLER ULTRASOUND OF THE TESTICLES TECHNIQUE: Complete ultrasound examination of the testicles, epididymis, and other scrotal structures was performed. Color and spectral Doppler ultrasound were also utilized to evaluate blood flow to the testicles. COMPARISON:  CT 03/10/2018 FINDINGS: Right testicle Measurements: 4.2 x 2.5 x 2.6 cm. No mass or microlithiasis visualized. Left testicle Measurements: 4.2 x 2.5 x 2.7 cm. No mass or microlithiasis visualized. Right epididymis:  Cyst measuring 9 x 8 x 11 mm Left epididymis:  Cyst measuring 6 x 4 mm Hydrocele:  Small left hydrocele.  Moderate right hydrocele. Varicocele:  Small right varicocele. Significant scrotal skin thickening with diffuse edema in the soft tissues Pulsed Doppler interrogation of both testes demonstrates normal low resistance arterial and venous waveforms bilaterally. IMPRESSION: 1. Negative for testicular torsion or intratesticular mass 2. Bilateral hydroceles, small on the left and moderate on the right 3. Small right varicocele 4. Extensive scrotal edema and skin thickening Electronically Signed   By: Donavan Foil M.D.   On: 03/15/2018 20:19   Ct Venogram Abd/pel  Result Date: 03/23/2018 CLINICAL DATA:  Right lower extremity DVT EXAM: CT VENOGRAM ABD-PELVIS TECHNIQUE: Multidetector CT imaging of the abdomen and pelvis was performed using  the standard protocol during bolus administration of intravenous contrast. Multiplanar reconstructed images and MIPs were obtained and reviewed to evaluate the venous anatomy. CONTRAST:  160m ISOVUE-300 IOPAMIDOL (ISOVUE-300) INJECTION 61% COMPARISON:  03/10/2018 FINDINGS: VASCULAR Veins: Hepatic veins, portal vein, and splenic vein are patent. Superior mesenteric vein is patent. IVC is patent.  Renal veins are patent. Left common and external iliac veins are patent. The right common and external iliac veins are patent. There is compression of the right common and external iliac veins secondary to residual retroperitoneal hemorrhage and/or infiltrative mass. There is low-density filling defect in the right common femoral vein compatible with DVT. The right femoral vein is grossly patent. No evidence of left common femoral or femoral DVT. Review of the MIP images confirms the above findings. NON-VASCULAR Lower chest: Bibasilar atelectasis for scarring. Hepatobiliary: Liver is unremarkable.  Gallbladder is decompressed. Pancreas: Unremarkable Spleen: Unremarkable Adrenals/Urinary Tract: Nonspecific hypodensity in the mid right kidney. Left kidney is within normal limits. Right hydronephrosis has nearly resolved. Small calculus in the posterior lower pole of the right kidney is unchanged. Stomach/Bowel: Severe wall thickening of a loop of bowel in the right lower quadrant either represents the terminal ileum or cecum. Wall thickening is very nodular and this is worrisome for adenocarcinoma or lymphoma. There is no evidence of small-bowel obstruction. The appendix is not readily visualized. There is no definite extraluminal bowel gas to suggest perforation. Stomach and duodenum  are unremarkable. Lymphatic: There is no abnormal retroperitoneal adenopathy. Mild atherosclerotic calcification of the aorta without aneurysm. Reproductive: Prostate is unremarkable other than calcifications. Other: On the prior study, there  was ill-defined and infiltrative soft tissue density throughout the right lower quadrant of the abdomen and extending into the right iliopsoas musculature and right inguinal region. This soft tissue density has largely resolved with some residual stranding throughout the retroperitoneum. The large infiltrative mass is now simply replaced by stranding and ill-defined bowel loops. Expansion of the right iliopsoas musculature has also improved. There is stranding throughout the musculature of the proximal right thigh which is stable. Due to the dramatic improvement of the infiltrative mass, this may represent a dramatic response to chemotherapy if this correlates with the given history. Alternatively, the ill-defined mass may represent hemorrhage which has evolved. Musculoskeletal: No vertebral compression deformity. IMPRESSION: VASCULAR There is no evidence of DVT in the abdomen or pelvis. Specifically, there is no evidence of right common or external iliac vein DVT. The study is positive for DVT in the right common femoral vein. NON-VASCULAR The large infiltrative mass in the right lower quadrant has largely improved. The dramatic improvement either represents a rapid response to chemotherapy or possibly evolving hemorrhage. Correlate clinically with vital signs and hemoglobin levels. The right hydronephrosis has also nearly resolved. There is persistent compression of the right common and external iliac veins due to residual retroperitoneal hemorrhage. Circumferential mass in the terminal ileum or cecum worrisome for adenocarcinoma or lymphoma. It is conceivable that the wall thickening may be related to hemorrhage in the wall of bowel, but it is rather nodular suggesting neoplasm. Electronically Signed   By: Marybelle Killings M.D.   On: 03/23/2018 08:17   Ir Imaging Guided Port Insertion  Result Date: 03/17/2018 INDICATION: Recent diagnosis of lymphoma. In need of durable intravenous access for chemotherapy  administration. EXAM: IMPLANTED PORT A CATH PLACEMENT WITH ULTRASOUND AND FLUOROSCOPIC GUIDANCE COMPARISON:  CT the chest, abdomen and pelvis-03/10/2018 MEDICATIONS: Ancef 2 gm IV; The antibiotic was administered within an appropriate time interval prior to skin puncture. ANESTHESIA/SEDATION: Moderate (conscious) sedation was employed during this procedure. A total of Versed 0.5 mg and Fentanyl 25 mcg was administered intravenously. Moderate Sedation Time: 26 minutes. The patient's level of consciousness and vital signs were monitored continuously by radiology nursing throughout the procedure under my direct supervision. CONTRAST:  None FLUOROSCOPY TIME:  12 seconds (5 mGy) COMPLICATIONS: None immediate. PROCEDURE: The procedure, risks, benefits, and alternatives were explained to the patient. Questions regarding the procedure were encouraged and answered. The patient understands and consents to the procedure. The right neck and chest were prepped with chlorhexidine in a sterile fashion, and a sterile drape was applied covering the operative field. Maximum barrier sterile technique with sterile gowns and gloves were used for the procedure. A timeout was performed prior to the initiation of the procedure. Local anesthesia was provided with 1% lidocaine with epinephrine. After creating a small venotomy incision, a micropuncture kit was utilized to access the internal jugular vein. Real-time ultrasound guidance was utilized for vascular access including the acquisition of a permanent ultrasound image documenting patency of the accessed vessel. The microwire was utilized to measure appropriate catheter length. A subcutaneous port pocket was then created along the upper chest wall utilizing a combination of sharp and blunt dissection. The pocket was irrigated with sterile saline. A single lumen ISP power injectable port was chosen for placement. The 8 Fr catheter was tunneled from the port pocket  site to the venotomy  incision. The port was placed in the pocket. The external catheter was trimmed to appropriate length. At the venotomy, an 8 Fr peel-away sheath was placed over a guidewire under fluoroscopic guidance. The catheter was then placed through the sheath and the sheath was removed. Final catheter positioning was confirmed and documented with a fluoroscopic spot radiograph. The port was accessed with a Huber needle and was noted to easily aspirate and flush. The Port a Catheter was left accessed for the initiation of chemotherapy later today. The venotomy site was closed with an interrupted 4-0 Vicryl suture. The port pocket incision was closed with interrupted 2-0 Vicryl suture and the skin was opposed with a running subcuticular 4-0 Vicryl suture. Dermabond and Steri-strips were applied to both incisions. Dressings were placed. The patient tolerated the procedure well without immediate post procedural complication. FINDINGS: After catheter placement, the tip lies within the superior cavoatrial junction. The catheter aspirates and flushes normally and is ready for immediate use. IMPRESSION: Successful placement of a right internal jugular approach power injectable Port-A-Cath. The catheter is ready for immediate use. Electronically Signed   By: Sandi Mariscal M.D.   On: 03/17/2018 10:42    Discharge Laboratory Values: . CBC Latest Ref Rng & Units 04/14/2018 04/13/2018 04/12/2018  WBC 4.0 - 10.5 K/uL 5.8 5.9 21.0(H)  Hemoglobin 13.0 - 17.0 g/dL 9.9(L) 9.8(L) 10.3(L)  Hematocrit 39.0 - 52.0 % 30.0(L) 30.2(L) 31.5(L)  Platelets 150 - 400 K/uL 493(H) 497(H) 583(H)    CBC    Component Value Date/Time   WBC 5.8 04/14/2018 0527   RBC 3.44 (L) 04/14/2018 0527   HGB 9.9 (L) 04/14/2018 0527   HGB 10.5 (L) 04/06/2018 1435   HCT 30.0 (L) 04/14/2018 0527   PLT 493 (H) 04/14/2018 0527   PLT 640 (H) 04/06/2018 1435   MCV 87.2 04/14/2018 0527   MCH 28.8 04/14/2018 0527   MCHC 33.0 04/14/2018 0527   RDW 16.8 (H)  04/14/2018 0527   LYMPHSABS 0.5 (L) 04/10/2018 1953   MONOABS 0.1 04/10/2018 1953   EOSABS 0.0 04/10/2018 1953   BASOSABS 0.1 04/10/2018 1953   . CMP Latest Ref Rng & Units 04/14/2018 04/13/2018 04/12/2018  Glucose 70 - 99 mg/dL 185(H) 175(H) 166(H)  BUN 8 - 23 mg/dL 14 18 22   Creatinine 0.61 - 1.24 mg/dL 0.59(L) 0.59(L) 0.77  Sodium 135 - 145 mmol/L 140 141 139  Potassium 3.5 - 5.1 mmol/L 3.8 3.9 4.3  Chloride 98 - 111 mmol/L 106 107 106  CO2 22 - 32 mmol/L 27 26 24   Calcium 8.9 - 10.3 mg/dL 8.9 9.1 9.2  Total Protein 6.5 - 8.1 g/dL 5.7(L) 5.9(L) 6.3(L)  Total Bilirubin 0.3 - 1.2 mg/dL 0.4 0.4 0.5  Alkaline Phos 38 - 126 U/L 39 38 44  AST 15 - 41 U/L 22 17 16   ALT 0 - 44 U/L 32 21 19     Brief H and P: For complete details please refer to admission H and P, but in brief,   Mark Frey was admitted for his C2 EPOCH and tolerated it well overall without any acute treatment related toxicities.  Issues during hospitalization  1.Burkittymphomastage IIAEbulkydisease 03/10/18 CT A/P revealedLarge irregular infiltrative solid mass in the right lower quadrant measuring up to 18.7 x 18.5 x 17.8 cm, infiltrating and encasing multiple distal small bowel loops and likely the ileocecal region, partially encasing the sigmoid colon, with prominent extension into the right lower retroperitoneum and extraperitoneal right pelvis with  encasement of right external iliac and proximal right common iliac vasculature and infiltration of the right iliopsoas muscle  PLAN: -patient tolerated C1 of inpatient [facility] well with no acute significant toxicities -scheduled for neulasta as outpatient on 04/15/2018 -Rituxan as outpatient on 04/17/2018 -Will add intrathecal Methotrexateback from C3 to avoid interrupting anticoagulation at this time. -Will discontinue Allopurinol after C2if no evidence of TLS -Continue eating well, hydrating well and walking around as often as reasonably possible -will discuss  about referring him for outpatient cancer rehab -RTC with Dr [name] with labs in clinic on 04/20/2018 for lab check     2. RLE DVT and PE -continue Lovenox 181m [place] daily  3) GI bleeding related to bowel involvement by lymphoma. -no overt bleeding at this time -continue to monitor. hgb slightly drifting down possibly from chemotherapy. No overt bleeding. Will monitor.  4) DVT px -- on therapeutic lovenox  5) Constipation - senna s 2 tab po HS    Physical Exam at Discharge: BP (!) 142/77 (BP Location: Left Arm)   Pulse 94   Temp 98.1 F (36.7 C) (Oral)   Resp 18   SpO2 98%   GENERAL:alert, in no acute distress and comfortable SKIN: no acute rashes, no significant lesions EYES: conjunctiva are pink and non-injected, sclera anicteric OROPHARYNX: MMM, no exudates, no oropharyngeal erythema or ulceration NECK: supple, no JVD LYMPH:  no palpable lymphadenopathy in the cervical, axillary or inguinal regions LUNGS: clear to auscultation b/l with normal respiratory effort HEART: regular rate & rhythm ABDOMEN:  normoactive bowel sounds , non tender, not distended. 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  Condition at Discharge:   Stable  Signed: Dr. GSullivan LoneMD [phone] 04/14/2018, 9:45 AM   Time spent discharging Patient >329ms

## 2018-04-14 NOTE — Telephone Encounter (Signed)
Called pt re appt changes per 8/2 sch msg - spoke w/ pt re appts

## 2018-04-15 ENCOUNTER — Inpatient Hospital Stay: Payer: BLUE CROSS/BLUE SHIELD

## 2018-04-17 ENCOUNTER — Inpatient Hospital Stay: Payer: BLUE CROSS/BLUE SHIELD

## 2018-04-17 ENCOUNTER — Inpatient Hospital Stay: Payer: BLUE CROSS/BLUE SHIELD | Admitting: Nutrition

## 2018-04-17 ENCOUNTER — Inpatient Hospital Stay: Payer: BLUE CROSS/BLUE SHIELD | Attending: Hematology

## 2018-04-17 VITALS — BP 118/71 | HR 91 | Temp 98.4°F | Resp 17 | Ht 69.0 in | Wt 233.5 lb

## 2018-04-17 DIAGNOSIS — C8510 Unspecified B-cell lymphoma, unspecified site: Secondary | ICD-10-CM

## 2018-04-17 DIAGNOSIS — I2699 Other pulmonary embolism without acute cor pulmonale: Secondary | ICD-10-CM | POA: Insufficient documentation

## 2018-04-17 DIAGNOSIS — D500 Iron deficiency anemia secondary to blood loss (chronic): Secondary | ICD-10-CM | POA: Insufficient documentation

## 2018-04-17 DIAGNOSIS — K922 Gastrointestinal hemorrhage, unspecified: Secondary | ICD-10-CM | POA: Insufficient documentation

## 2018-04-17 DIAGNOSIS — C8378 Burkitt lymphoma, lymph nodes of multiple sites: Secondary | ICD-10-CM

## 2018-04-17 DIAGNOSIS — Z95828 Presence of other vascular implants and grafts: Secondary | ICD-10-CM

## 2018-04-17 DIAGNOSIS — Z7901 Long term (current) use of anticoagulants: Secondary | ICD-10-CM | POA: Diagnosis not present

## 2018-04-17 DIAGNOSIS — C8373 Burkitt lymphoma, intra-abdominal lymph nodes: Secondary | ICD-10-CM | POA: Diagnosis not present

## 2018-04-17 DIAGNOSIS — I82491 Acute embolism and thrombosis of other specified deep vein of right lower extremity: Secondary | ICD-10-CM | POA: Insufficient documentation

## 2018-04-17 DIAGNOSIS — Z79899 Other long term (current) drug therapy: Secondary | ICD-10-CM | POA: Diagnosis not present

## 2018-04-17 LAB — CMP (CANCER CENTER ONLY)
ALT: 47 U/L — ABNORMAL HIGH (ref 0–44)
AST: 18 U/L (ref 15–41)
Albumin: 3.4 g/dL — ABNORMAL LOW (ref 3.5–5.0)
Alkaline Phosphatase: 47 U/L (ref 38–126)
Anion gap: 11 (ref 5–15)
BUN: 15 mg/dL (ref 8–23)
CHLORIDE: 100 mmol/L (ref 98–111)
CO2: 25 mmol/L (ref 22–32)
Calcium: 9.3 mg/dL (ref 8.9–10.3)
Creatinine: 0.75 mg/dL (ref 0.61–1.24)
Glucose, Bld: 198 mg/dL — ABNORMAL HIGH (ref 70–99)
POTASSIUM: 3.9 mmol/L (ref 3.5–5.1)
Sodium: 136 mmol/L (ref 135–145)
Total Bilirubin: 0.4 mg/dL (ref 0.3–1.2)
Total Protein: 6.1 g/dL — ABNORMAL LOW (ref 6.5–8.1)

## 2018-04-17 LAB — CBC WITH DIFFERENTIAL (CANCER CENTER ONLY)
BASOS ABS: 0 10*3/uL (ref 0.0–0.1)
BASOS PCT: 0 %
EOS PCT: 3 %
Eosinophils Absolute: 0.1 10*3/uL (ref 0.0–0.5)
HCT: 31.7 % — ABNORMAL LOW (ref 38.4–49.9)
Hemoglobin: 10.2 g/dL — ABNORMAL LOW (ref 13.0–17.1)
Lymphocytes Relative: 16 %
Lymphs Abs: 0.5 10*3/uL — ABNORMAL LOW (ref 0.9–3.3)
MCH: 28.6 pg (ref 27.2–33.4)
MCHC: 32.2 g/dL (ref 32.0–36.0)
MCV: 88.8 fL (ref 79.3–98.0)
MONO ABS: 0 10*3/uL — AB (ref 0.1–0.9)
Monocytes Relative: 1 %
Neutro Abs: 2.7 10*3/uL (ref 1.5–6.5)
Neutrophils Relative %: 80 %
PLATELETS: 336 10*3/uL (ref 140–400)
RBC: 3.57 MIL/uL — ABNORMAL LOW (ref 4.20–5.82)
RDW: 17.7 % — AB (ref 11.0–14.6)
WBC Count: 3.3 10*3/uL — ABNORMAL LOW (ref 4.0–10.3)

## 2018-04-17 LAB — SAMPLE TO BLOOD BANK

## 2018-04-17 LAB — URIC ACID: URIC ACID, SERUM: 3 mg/dL — AB (ref 3.7–8.6)

## 2018-04-17 LAB — FERRITIN: FERRITIN: 1134 ng/mL — AB (ref 24–336)

## 2018-04-17 MED ORDER — ACETAMINOPHEN 325 MG PO TABS
ORAL_TABLET | ORAL | Status: AC
  Filled 2018-04-17: qty 2

## 2018-04-17 MED ORDER — PEGFILGRASTIM INJECTION 6 MG/0.6ML ~~LOC~~
6.0000 mg | PREFILLED_SYRINGE | Freq: Once | SUBCUTANEOUS | Status: AC
  Administered 2018-04-17: 6 mg via SUBCUTANEOUS

## 2018-04-17 MED ORDER — ACETAMINOPHEN 325 MG PO TABS
650.0000 mg | ORAL_TABLET | Freq: Once | ORAL | Status: AC
  Administered 2018-04-17: 650 mg via ORAL

## 2018-04-17 MED ORDER — SODIUM CHLORIDE 0.9% FLUSH
10.0000 mL | Freq: Once | INTRAVENOUS | Status: AC
  Administered 2018-04-17: 10 mL
  Filled 2018-04-17: qty 10

## 2018-04-17 MED ORDER — DIPHENHYDRAMINE HCL 25 MG PO CAPS
ORAL_CAPSULE | ORAL | Status: AC
  Filled 2018-04-17: qty 2

## 2018-04-17 MED ORDER — DIPHENHYDRAMINE HCL 25 MG PO CAPS
50.0000 mg | ORAL_CAPSULE | Freq: Once | ORAL | Status: AC
  Administered 2018-04-17: 50 mg via ORAL

## 2018-04-17 MED ORDER — HEPARIN SOD (PORK) LOCK FLUSH 100 UNIT/ML IV SOLN
500.0000 [IU] | Freq: Once | INTRAVENOUS | Status: AC | PRN
  Administered 2018-04-17: 500 [IU]
  Filled 2018-04-17: qty 5

## 2018-04-17 MED ORDER — SODIUM CHLORIDE 0.9% FLUSH
10.0000 mL | INTRAVENOUS | Status: DC | PRN
  Administered 2018-04-17: 10 mL
  Filled 2018-04-17: qty 10

## 2018-04-17 MED ORDER — METHYLPREDNISOLONE SODIUM SUCC 125 MG IJ SOLR
80.0000 mg | Freq: Every day | INTRAMUSCULAR | Status: DC
  Administered 2018-04-17: 80 mg via INTRAVENOUS

## 2018-04-17 MED ORDER — METHYLPREDNISOLONE SODIUM SUCC 125 MG IJ SOLR
INTRAMUSCULAR | Status: AC
  Filled 2018-04-17: qty 2

## 2018-04-17 MED ORDER — SODIUM CHLORIDE 0.9 % IV SOLN
375.0000 mg/m2 | Freq: Once | INTRAVENOUS | Status: AC
  Administered 2018-04-17: 900 mg via INTRAVENOUS
  Filled 2018-04-17: qty 50

## 2018-04-17 MED ORDER — SODIUM CHLORIDE 0.9 % IV SOLN: 375.0000 mg/m2 | Freq: Once | INTRAVENOUS | Status: DC

## 2018-04-17 MED ORDER — PEGFILGRASTIM INJECTION 6 MG/0.6ML ~~LOC~~
PREFILLED_SYRINGE | SUBCUTANEOUS | Status: AC
  Filled 2018-04-17: qty 0.6

## 2018-04-17 NOTE — Progress Notes (Signed)
62 year old male diagnosed with Burkitt lymphoma.  He is a patient of [name].  Past medical history includes hyperlipidemia and diabetes.  Medications include Zofran, prednisone, Janumet, Senokot, and Compazine.  Labs include glucose 198 and albumin 3.4 on August 5.  Height: 69 inches. Weight: 233.5 pounds. Usual body weight: 261 pounds in April 2019. BMI: 34.48.  Patient reports he is feeling well. He denies problems with oral intake or appetite. He denies nausea, vomiting, diarrhea, and constipation. He is trying to eat more frequent meals and snacks. Reports he does eat some carbohydrates. He has no nutrition concerns.  Nutrition diagnosis:  Food and nutrition related knowledge deficit related to lymphoma and associated treatments as evidenced by no prior need for nutrition related information.  Intervention: I educated patient to consume adequate calories and high-protein foods for weight maintenance. Explained the role of protein for healing and maintaining muscle mass. Encourage patient to be physically active as able. Educated patient on the importance of consuming higher fiber carbohydrates in moderation. Provided fact sheets.  Questions were answered.  Teach back method used.  Contact information given.  Monitoring, evaluation, goals: Patient will tolerate adequate calories and protein to minimize weight loss throughout treatment.  Next visit: To be scheduled if needed.  **Disclaimer: This note was dictated with voice recognition software. Similar sounding words can inadvertently be transcribed and this note may contain transcription errors which may not have been corrected upon publication of note.**

## 2018-04-17 NOTE — Patient Instructions (Signed)
[place] Discharge Instructions for Patients Receiving Chemotherapy  Today you received the following chemotherapy agents Rituxan  To help prevent nausea and vomiting after your treatment, we encourage you to take your nausea medication as directed   If you develop nausea and vomiting that is not controlled by your nausea medication, call the clinic.   BELOW ARE SYMPTOMS THAT SHOULD BE REPORTED IMMEDIATELY:  *FEVER GREATER THAN 100.5 F  *CHILLS WITH OR WITHOUT FEVER  NAUSEA AND VOMITING THAT IS NOT CONTROLLED WITH YOUR NAUSEA MEDICATION  *UNUSUAL SHORTNESS OF BREATH  *UNUSUAL BRUISING OR BLEEDING  TENDERNESS IN MOUTH AND THROAT WITH OR WITHOUT PRESENCE OF ULCERS  *URINARY PROBLEMS  *BOWEL PROBLEMS  UNUSUAL RASH Items with * indicate a potential emergency and should be followed up as soon as possible.  Feel free to call the clinic should you have any questions or concerns. The clinic phone number is [phone].  Please show the [place] at check-in to the Emergency Department and triage nurse.

## 2018-04-18 ENCOUNTER — Other Ambulatory Visit: Payer: Self-pay | Admitting: Hematology

## 2018-04-18 ENCOUNTER — Encounter (HOSPITAL_COMMUNITY)
Admission: RE | Admit: 2018-04-18 | Discharge: 2018-04-18 | Disposition: A | Discharge status: Home / Self Care | Payer: BLUE CROSS/BLUE SHIELD | Source: Ambulatory Visit | Attending: Hematology | Admitting: Hematology

## 2018-04-18 DIAGNOSIS — C8370 Burkitt lymphoma, unspecified site: Secondary | ICD-10-CM | POA: Diagnosis not present

## 2018-04-18 DIAGNOSIS — C8378 Burkitt lymphoma, lymph nodes of multiple sites: Secondary | ICD-10-CM | POA: Diagnosis not present

## 2018-04-18 LAB — GLUCOSE, CAPILLARY: Glucose-Capillary: 99 mg/dL (ref 70–99)

## 2018-04-18 MED ORDER — FLUDEOXYGLUCOSE F - 18 (FDG) INJECTION
11.6000 | Freq: Once | INTRAVENOUS | Status: AC
  Administered 2018-04-18: 11.6 via INTRAVENOUS

## 2018-04-19 NOTE — Progress Notes (Signed)
HEMATOLOGY/ONCOLOGY CLINIC NOTE  Date of Service: 04/20/18    Patient Care Team: [name], MD as PCP - General (Family Medicine)  CHIEF COMPLAINTS/PURPOSE OF CONSULTATION:  High grade B-cell lymphoma   HISTORY OF PRESENTING ILLNESS:   Mark Frey is a wonderful 62 y.o. male who has been referred to [place] by my colleague Dr. 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 He is accompanied today by his wife. The pt reports that he is doing well overall.   The pt started R-EPOCH every 3 weeks with neulasta support on 03/17/18. He presented to the ED with right leg swelling, found to be a DVT and bilateral PE, which resulted in the incidental finding of a lower abdomen tumor measuring 18.7 x 18.5 x 17.8 cm. He denies any abdominal pains or changes in his bowel habits prior to this finding. He first noted blood in the stools 4-5 days prior to appearing to the ED.  The pt reports that he has been taking 137m Lovenox injections once each day, except for the days in which he had blood in the stools. He hasn't had blood in his stools for the last 7 days.   He has had GI bleed related to bowel involvement with his lymphoma - this is now resolving and his hgb is stabilizing.  He notes that his scrotum and leg swelling has decreased. He denies having CP or SOB at any point from his PE.   Most recent lab results (04/06/18) of CBC w/diff, CMP, Reticulocytes  is as follows: all values are WNL except for WBC at 15.3k, RBC at 3.65, HGB at 10.5, HCT at 31.4, RDW at 17.8, PLT at 640k, ANC at 12.7k, Monocytes abs at 1.6k, Glucose at 162, Total Protein at 6.2, Albumin at 3.1, Total bilirubin at <0.2, Retic ct pct at 2.5%. Uric acid 04/06/18 was low at 3.0 LDH 04/06/18 elevated at 251  On review of systems, pt reports remaining right leg swelling, blood in the stools 7 days ago, two bowel movements  each day, left leg swelling, improved scrotal swelling, eating well, and denies problems with his port, abdominal pains, constipation, diarrhea, jaw pain, pain along the spine, problems passing urine, and any other symptoms.  Interval History:   Mark MINSHALLreturns today for management, evaluation, and treatment of his Burkitt Lymphoma. I last saw the pt at discharge on 04/14/18 following completion of inpatient C2 EPOCH-R. He is accompanied today by his wife. The pt reports that he is doing well overall.   The pt reports that he has been eating well with a strong appetite and has had good energy levels. He has developed a skin rash on his bottom in the interim and has been applying AD ointment.   He continues on Lovenox without complaint or difficulty tolerating it. He has been moving his bowels well while taking Senna S and denies any blood in the stools.   Of note since the patient's last visit, pt has had PET/CT completed on 04/18/18 with results revealing Continued improvement in right colonic wall thickening and surrounding bulky masses consistent with treated lymphoma. No hypermetabolic activity to suggest residual tumor. There is some hypermetabolic activity associated with the sigmoid colon which demonstrates mild wall thickening, surrounding inflammation and underlying diverticulosis, suggesting mild diverticulitis. Suspected treatment related changes throughout the bone marrow. There is mildly increased activity within the clivus without  clear corresponding finding on the CT images. Attention on follow-up recommended. Nonobstructing right renal calculi. 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  Lab results today (04/20/18) of CBC w/diff, CMP is as follows: all values are WNL except for WBC at 3.1k, RBC at 3.61, HGB at 10.3, HCT at 32.1, RDW at 17.9, Lymphs abs at 500, Glucose at 193, ALT at 47.  On review of systems, pt reports eating well, strong appetite, some weight loss, good energy levels,  skin rash on bottom, moving his bowels well, slowly resolving right leg swelling, and denies blood in the stools, abdominal pains, pain/soreness, lower abdominal pain, and any other symptoms.    MEDICAL HISTORY:  Past Medical History:  Diagnosis Date  . ALLERGIC RHINITIS   . Diabetes mellitus   . Hyperlipidemia     SURGICAL HISTORY: Past Surgical History:  Procedure Laterality Date  . BIOPSY  03/15/2018   Procedure: BIOPSY;  Surgeon: Milus Banister, MD;  Location: [facility];  Service: Endoscopy;;  . CLEFT PALATE REPAIR    . COLONOSCOPY N/A 03/15/2018   Procedure: COLONOSCOPY;  Surgeon: Milus Banister, MD;  Location: [facility];  Service: Endoscopy;  Laterality: N/A;  . deviated septum repair     slight improvement  . ESOPHAGOGASTRODUODENOSCOPY N/A 03/15/2018   Procedure: ESOPHAGOGASTRODUODENOSCOPY (EGD);  Surgeon: Milus Banister, MD;  Location: [name];  Service: Endoscopy;  Laterality: N/A;  . IR IMAGING GUIDED PORT INSERTION  03/17/2018  . TONSILLECTOMY      SOCIAL HISTORY: Social History   Socioeconomic History  . Marital status: Married    Spouse name: Not on file  . Number of children: Not on file  . Years of education: Not on file  . Highest education level: Not on file  Occupational History  . Not on file  Social Needs  . Financial resource strain: Not on file  . Food insecurity:    Worry: Not on file    Inability: Not on file  . Transportation needs:    Medical: Not on file    Non-medical: Not on file  Tobacco Use  . Smoking status: Never Smoker  . Smokeless tobacco: Never Used  Substance and Sexual Activity  . Alcohol use: Yes    Comment: occasional  . Drug use: No  . Sexual activity: Yes  Lifestyle  . Physical activity:    Days per week: Not on file    Minutes per session: Not on file  . Stress: Not on file  Relationships  . Social connections:    Talks on phone: Not on file    Gets together: Not on file    Attends religious service:  Not on file    Active member of club or organization: Not on file    Attends meetings of clubs or organizations: Not on file    Relationship status: Not on file  . Intimate partner violence:    Fear of current or ex partner: Not on file    Emotionally abused: Not on file    Physically abused: Not on file    Forced sexual activity: Not on file  Other Topics Concern  . Not on file  Social History Narrative   Married 1985. No kids. 4 small dogs.       Works in Financial trader, residential      Hobbies: work on cars, Haematologist, exercise as able    FAMILY HISTORY: Family History  Problem Relation Age of Onset  . Lung cancer Mother  smoker  . Brain cancer Mother        metastasis  . AAA (abdominal aortic aneurysm) Father        smoker    ALLERGIES:  is allergic to ciprofloxacin.  MEDICATIONS:  Current Outpatient Medications  Medication Sig Dispense Refill  . acetaminophen (TYLENOL) 325 MG tablet Take 2 tablets (650 mg total) by mouth every 6 (six) hours as needed for mild pain (or Fever >/= 101).    [name] allopurinol (ZYLOPRIM) 300 MG tablet Take 1 tablet (300 mg total) by mouth daily. (Patient taking differently: Take 300 mg by mouth every evening. ) 30 tablet 0  . blood glucose meter kit and supplies Dispense based on patient and insurance preference. Use daily as directed. (E11.9). 1 each 0  . Choline Fenofibrate 135 MG capsule TAKE 1 CAPSULE DAILY (Patient taking differently: Take one capsule by mouth every evening.) 90 capsule 1  . enoxaparin (LOVENOX) 150 MG/ML injection Inject 1 mL (150 mg total) into the skin daily. (Patient taking differently: Inject 150 mg into the skin daily. @ 1800) 30 Syringe 0  . glucose blood (FREESTYLE TEST STRIPS) test strip Use to check blood sugar daily 100 each 4  . JANUMET 50-1000 MG tablet TAKE 1 TABLET TWICE DAILY  WITH MEALS 180 tablet 3  . lidocaine-prilocaine (EMLA) cream Apply 1 application topically as needed. (Patient taking  differently: Apply 1 application topically daily as needed (for treatment.). ) 30 g 2  . ondansetron (ZOFRAN) 8 MG tablet Take 1 tablet (8 mg total) by mouth 2 (two) times daily as needed (Nausea or vomiting). 30 tablet 1  . polyethylene glycol (MIRALAX / GLYCOLAX) packet Take 17 g by mouth daily as needed for mild constipation. 14 each 0  . prochlorperazine (COMPAZINE) 10 MG tablet Take 1 tablet (10 mg total) by mouth every 6 (six) hours as needed (Nausea or vomiting). 30 tablet 1  . senna-docusate (SENOKOT-S) 8.6-50 MG tablet Take 2 tablets by mouth at bedtime. 60 tablet 1   No current facility-administered medications for this visit.     REVIEW OF SYSTEMS:    A 10+ POINT REVIEW OF SYSTEMS WAS OBTAINED including neurology, dermatology, psychiatry, cardiac, respiratory, lymph, extremities, GI, GU, Musculoskeletal, constitutional, breasts, reproductive, HEENT.  All pertinent positives are noted in the HPI.  All others are negative.   PHYSICAL EXAMINATION: ECOG PERFORMANCE STATUS: 1 - Symptomatic but completely ambulatory  . Vitals:   04/20/18 0900  BP: 123/81  Pulse: (!) 103  Resp: 18  Temp: 98.1 F (36.7 C)  SpO2: 100%   Filed Weights   04/20/18 0900  Weight: 229 lb 1.6 oz (103.9 kg)   .Body mass index is 33.83 kg/m.  GENERAL:alert, in no acute distress and comfortable SKIN: rash on bottom, no significant lesions EYES: conjunctiva are pink and non-injected, sclera anicteric OROPHARYNX: MMM, no exudates, no oropharyngeal erythema or ulceration NECK: supple, no JVD LYMPH:  no palpable lymphadenopathy in the cervical, axillary or inguinal regions LUNGS: clear to auscultation b/l with normal respiratory effort HEART: regular rate & rhythm ABDOMEN:  normoactive bowel sounds , non tender, not distended. No palpable hepatosplenomegaly.  Extremity: 1+ rt pedal edema PSYCH: alert & oriented x 3 with fluent speech NEURO: no focal motor/sensory deficits   LABORATORY DATA:  I have  reviewed the data as listed  . CBC Latest Ref Rng & Units 04/20/2018 04/17/2018 04/14/2018  WBC 4.0 - 10.3 K/uL 3.1(L) 3.3(L) 5.8  Hemoglobin 13.0 - 17.1 g/dL 10.3(L) 10.2(L) 9.9(L)  Hematocrit 38.4 - 49.9 % 32.1(L) 31.7(L) 30.0(L)  Platelets 140 - 400 K/uL 156 336 493(H)    . CMP Latest Ref Rng & Units 04/20/2018 04/17/2018 04/14/2018  Glucose 70 - 99 mg/dL 193(H) 198(H) 185(H)  BUN 8 - 23 mg/dL 11 15 14   Creatinine 0.61 - 1.24 mg/dL 0.77 0.75 0.59(L)  Sodium 135 - 145 mmol/L 136 136 140  Potassium 3.5 - 5.1 mmol/L 4.1 3.9 3.8  Chloride 98 - 111 mmol/L 100 100 106  CO2 22 - 32 mmol/L 24 25 27   Calcium 8.9 - 10.3 mg/dL 9.7 9.3 8.9  Total Protein 6.5 - 8.1 g/dL 6.7 6.1(L) 5.7(L)  Total Bilirubin 0.3 - 1.2 mg/dL 0.6 0.4 0.4  Alkaline Phos 38 - 126 U/L 61 47 39  AST 15 - 41 U/L 18 18 22   ALT 0 - 44 U/L 47(H) 47(H) 32   03/17/18 Cytogenetics:   03/17/18 Colon Bx:   03/13/18 Peritoneum Biopsy:     RADIOGRAPHIC STUDIES:  I have personally reviewed the radiological images as listed and agreed with the findings in the report. Nm Pet Image Initial (pi) Skull Base To Thigh  Result Date: 04/18/2018 CLINICAL DATA:  Initial treatment strategy for Burkitt's lymphoma. EXAM: NUCLEAR MEDICINE PET SKULL BASE TO THIGH TECHNIQUE: 11.6 mCi F-18 FDG was injected intravenously. Full-ring PET imaging was performed from the skull base to thigh after the radiotracer. CT data was obtained and used for attenuation correction and anatomic localization. Fasting blood glucose: 99 mg/dl COMPARISON:  Chest CT 03/10/2018.  CT abdomen venogram 03/22/2018. FINDINGS: Mediastinal blood pool activity: SUV max 2.3 NECK: No hypermetabolic cervical lymph nodes are identified.There are no lesions of the pharyngeal mucosal space. Physiologic activity within the muscles of phonation. Incidental CT findings: none CHEST: There are no hypermetabolic mediastinal, hilar or axillary lymph nodes. No suspicious pulmonary activity. Incidental CT  findings: Right IJ Port-A-Cath extends to the superior cavoatrial juncture. Coronary artery atherosclerosis. Aside from mild basilar scarring or atelectasis, the lungs are clear. ABDOMEN/PELVIS: There is no hypermetabolic activity within the liver, adrenal glands, spleen or pancreas. There is no hypermetabolic nodal activity. There is some hypermetabolic activity within the left lower quadrant associated with the sigmoid colon (SUV max 8.9). There are multiple sigmoid colon diverticular changes with wall thickening and surrounding inflammation. Additional scattered bowel activity, within physiologic limits. Right colonic wall thickening noted on previous CTs has markedly improved, and without associated hypermetabolic activity. Incidental CT findings: Overall, the pelvic inflammatory changes have improved. No focal masses or fluid collections are seen. There are nonobstructing right renal calculi. No hydronephrosis. Mild aortic atherosclerosis. SKELETON: Low level hypermetabolic activity throughout the bones, likely treatment related. In the clivus, the activity appears slightly more prominent (SUV max 4.7). No corresponding lytic lesions seen on the CT images. Incidental CT findings: Low-density in the right femoral vein in the thigh, corresponding with previously demonstrated DVT. IMPRESSION: 1. Continued improvement in right colonic wall thickening and surrounding bulky masses consistent with treated lymphoma. No hypermetabolic activity to suggest residual tumor. 2. There is some hypermetabolic activity associated with the sigmoid colon which demonstrates mild wall thickening, surrounding inflammation and underlying diverticulosis, suggesting mild diverticulitis. 3. Suspected treatment related changes throughout the bone marrow. There is mildly increased activity within the clivus without clear corresponding finding on the CT images. Attention on follow-up recommended. 4. Nonobstructing right renal calculi. Known  femoral vein DVT on the right. Electronically Signed   By: Richardean Sale M.D.   On: 04/18/2018 14:22  Dg Fluoro Guided Loc Of Needle/cath Tip For Spinal Inject Rt  Result Date: 03/21/2018 CLINICAL DATA:  62 year old male with history of high-grade B-cell lymphoma. EXAM: FLUOROSCOPICALLY GUIDED LUMBAR PUNCTURE FOR INTRATHECAL CHEMOTHERAPY TECHNIQUE: Informed consent was obtained from the patient prior to the procedure, including potential complications of headache, allergy, and pain. A 'time out' was performed. With the patient prone, the lower back was prepped with Betadine. 1% Lidocaine was used for local anesthesia. Lumbar puncture was performed at the L2-L3 level using a 20 gauge needle with return of clear CSF. A total of 8 mL of CSF was collected into sterile tubing and sent to the laboratory for testing and analysis per orders of the primary medical team. Subsequently, 5 mL of methotrexate was injected into the subarachnoid space. The patient tolerated the procedure well without apparent complication. FLUOROSCOPY TIME:  48 seconds IMPRESSION: 1. Intrathecal injection of chemotherapy without complication. Electronically Signed   By: Vinnie Langton M.D.   On: 03/21/2018 14:39   Ct Venogram Abd/pel  Result Date: 03/23/2018 CLINICAL DATA:  Right lower extremity DVT EXAM: CT VENOGRAM ABD-PELVIS TECHNIQUE: Multidetector CT imaging of the abdomen and pelvis was performed using the standard protocol during bolus administration of intravenous contrast. Multiplanar reconstructed images and MIPs were obtained and reviewed to evaluate the venous anatomy. CONTRAST:  154m ISOVUE-300 IOPAMIDOL (ISOVUE-300) INJECTION 61% COMPARISON:  03/10/2018 FINDINGS: VASCULAR Veins: Hepatic veins, portal vein, and splenic vein are patent. Superior mesenteric vein is patent. IVC is patent.  Renal veins are patent. Left common and external iliac veins are patent. The right common and external iliac veins are patent. There is  compression of the right common and external iliac veins secondary to residual retroperitoneal hemorrhage and/or infiltrative mass. There is low-density filling defect in the right common femoral vein compatible with DVT. The right femoral vein is grossly patent. No evidence of left common femoral or femoral DVT. Review of the MIP images confirms the above findings. NON-VASCULAR Lower chest: Bibasilar atelectasis for scarring. Hepatobiliary: Liver is unremarkable.  Gallbladder is decompressed. Pancreas: Unremarkable Spleen: Unremarkable Adrenals/Urinary Tract: Nonspecific hypodensity in the mid right kidney. Left kidney is within normal limits. Right hydronephrosis has nearly resolved. Small calculus in the posterior lower pole of the right kidney is unchanged. Stomach/Bowel: Severe wall thickening of a loop of bowel in the right lower quadrant either represents the terminal ileum or cecum. Wall thickening is very nodular and this is worrisome for adenocarcinoma or lymphoma. There is no evidence of small-bowel obstruction. The appendix is not readily visualized. There is no definite extraluminal bowel gas to suggest perforation. Stomach and duodenum are unremarkable. Lymphatic: There is no abnormal retroperitoneal adenopathy. Mild atherosclerotic calcification of the aorta without aneurysm. Reproductive: Prostate is unremarkable other than calcifications. Other: On the prior study, there was ill-defined and infiltrative soft tissue density throughout the right lower quadrant of the abdomen and extending into the right iliopsoas musculature and right inguinal region. This soft tissue density has largely resolved with some residual stranding throughout the retroperitoneum. The large infiltrative mass is now simply replaced by stranding and ill-defined bowel loops. Expansion of the right iliopsoas musculature has also improved. There is stranding throughout the musculature of the proximal right thigh which is stable.  Due to the dramatic improvement of the infiltrative mass, this may represent a dramatic response to chemotherapy if this correlates with the given history. Alternatively, the ill-defined mass may represent hemorrhage which has evolved. Musculoskeletal: No vertebral compression deformity. IMPRESSION: VASCULAR There is  no evidence of DVT in the abdomen or pelvis. Specifically, there is no evidence of right common or external iliac vein DVT. The study is positive for DVT in the right common femoral vein. NON-VASCULAR The large infiltrative mass in the right lower quadrant has largely improved. The dramatic improvement either represents a rapid response to chemotherapy or possibly evolving hemorrhage. Correlate clinically with vital signs and hemoglobin levels. The right hydronephrosis has also nearly resolved. There is persistent compression of the right common and external iliac veins due to residual retroperitoneal hemorrhage. Circumferential mass in the terminal ileum or cecum worrisome for adenocarcinoma or lymphoma. It is conceivable that the wall thickening may be related to hemorrhage in the wall of bowel, but it is rather nodular suggesting neoplasm. Electronically Signed   By: Marybelle Killings M.D.   On: 03/23/2018 08:17    ASSESSMENT & PLAN:   62 y.o. male with  1. 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 No BM or CNS involvement.   2. RLE DVT and b/l PE  3. GI bleeding  from lymphoma involving bowels-- resolved at this time -- will need to monitor with anticoagulation.  PLAN: -Held  intrathecal Methotrexate during C2 to not interrupt blood thinners with current blood clot having gone to lungs and high-risk current blood clot in right leg -Discussed that there may later be a role for ISRT given bulky disease -Discussed pt labwork today, 04/20/18; HGB improving now to 10.3, PLT normalized at 156k, ANC normal at 2.3k -Discussed the 04/18/18 PET/CT which revealed Continued improvement in right colonic wall thickening and surrounding bulky masses consistent with treated lymphoma. No hypermetabolic activity to suggest residual tumor. There is some hypermetabolic activity associated with the sigmoid colon which demonstrates mild wall thickening, surrounding inflammation and underlying diverticulosis, suggesting mild diverticulitis. Suspected treatment related changes throughout the bone marrow. There is mildly increased activity within the clivus without clear corresponding finding on the CT images. Attention on follow-up recommended. Nonobstructing right renal calculi. 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 -Recommend infection prevention strategies including crowd avoidance and limiting exposure to individuals with infections and wearing a mask in public -Finish current Allopurinol prescription and then stop -Continue Lovenox- refilling today. Will hold Lovenox for 48 hours prior to intrathecal methotrexate -Continue using compression socks -Skin rash on bottom is not overtly concerning for fungal infection, recommend keeping area dry and using OTC antifungal powder and wearing loose clothes -Adding back Intrathecal Methotrexate back in C3 -Will see pt back with labs prior to beginning C3 of EPOCH-R and intrathecal Methotrexate    RTC with Dr [name] with labs on 04/28/2018 Plan for inpatient admission for EPOCH-R on 05/01/2018 for 5 days    All of the patients questions were  answered with apparent satisfaction. The patient knows to call the clinic with any problems, questions or concerns.  The total time spent in the appt was 25 minutes and more than 50% was on counseling and direct patient cares.      Sullivan Lone MD [employer] [facility] AAHIVMS [facility] Hematology/Oncology  Physician [facility]  (Office):       [phone] (Work cell):  [phone] (Fax):           [phone]  04/20/2018 10:18 AM  I, Baldwin Jamaica, am acting as a scribe for [name]  .I have reviewed the above documentation for accuracy and completeness, and I agree with the above. [name] MD

## 2018-04-20 ENCOUNTER — Inpatient Hospital Stay: Payer: BLUE CROSS/BLUE SHIELD

## 2018-04-20 ENCOUNTER — Telehealth: Payer: Self-pay

## 2018-04-20 ENCOUNTER — Inpatient Hospital Stay (HOSPITAL_BASED_OUTPATIENT_CLINIC_OR_DEPARTMENT_OTHER): Payer: BLUE CROSS/BLUE SHIELD | Admitting: Hematology

## 2018-04-20 VITALS — BP 123/81 | HR 103 | Temp 98.1°F | Resp 18 | Ht 69.0 in | Wt 229.1 lb

## 2018-04-20 DIAGNOSIS — Z7189 Other specified counseling: Secondary | ICD-10-CM

## 2018-04-20 DIAGNOSIS — Z7901 Long term (current) use of anticoagulants: Secondary | ICD-10-CM

## 2018-04-20 DIAGNOSIS — C8378 Burkitt lymphoma, lymph nodes of multiple sites: Secondary | ICD-10-CM

## 2018-04-20 DIAGNOSIS — C8373 Burkitt lymphoma, intra-abdominal lymph nodes: Secondary | ICD-10-CM

## 2018-04-20 DIAGNOSIS — Z79899 Other long term (current) drug therapy: Secondary | ICD-10-CM | POA: Diagnosis not present

## 2018-04-20 DIAGNOSIS — D500 Iron deficiency anemia secondary to blood loss (chronic): Secondary | ICD-10-CM

## 2018-04-20 DIAGNOSIS — I82491 Acute embolism and thrombosis of other specified deep vein of right lower extremity: Secondary | ICD-10-CM

## 2018-04-20 DIAGNOSIS — K922 Gastrointestinal hemorrhage, unspecified: Secondary | ICD-10-CM

## 2018-04-20 DIAGNOSIS — I2699 Other pulmonary embolism without acute cor pulmonale: Secondary | ICD-10-CM | POA: Diagnosis not present

## 2018-04-20 LAB — CBC WITH DIFFERENTIAL/PLATELET
BASOS ABS: 0 10*3/uL (ref 0.0–0.1)
Basophils Relative: 1 %
EOS PCT: 3 %
Eosinophils Absolute: 0.1 10*3/uL (ref 0.0–0.5)
HCT: 32.1 % — ABNORMAL LOW (ref 38.4–49.9)
Hemoglobin: 10.3 g/dL — ABNORMAL LOW (ref 13.0–17.1)
Lymphocytes Relative: 15 %
Lymphs Abs: 0.5 10*3/uL — ABNORMAL LOW (ref 0.9–3.3)
MCH: 28.5 pg (ref 27.2–33.4)
MCHC: 32.1 g/dL (ref 32.0–36.0)
MCV: 88.9 fL (ref 79.3–98.0)
Monocytes Absolute: 0.1 10*3/uL (ref 0.1–0.9)
Monocytes Relative: 4 %
Neutro Abs: 2.3 10*3/uL (ref 1.5–6.5)
Neutrophils Relative %: 77 %
PLATELETS: 156 10*3/uL (ref 140–400)
RBC: 3.61 MIL/uL — AB (ref 4.20–5.82)
RDW: 17.9 % — ABNORMAL HIGH (ref 11.0–14.6)
WBC: 3.1 10*3/uL — AB (ref 4.0–10.3)

## 2018-04-20 LAB — CMP (CANCER CENTER ONLY)
ALT: 47 U/L — AB (ref 0–44)
AST: 18 U/L (ref 15–41)
Albumin: 3.8 g/dL (ref 3.5–5.0)
Alkaline Phosphatase: 61 U/L (ref 38–126)
Anion gap: 12 (ref 5–15)
BUN: 11 mg/dL (ref 8–23)
CHLORIDE: 100 mmol/L (ref 98–111)
CO2: 24 mmol/L (ref 22–32)
CREATININE: 0.77 mg/dL (ref 0.61–1.24)
Calcium: 9.7 mg/dL (ref 8.9–10.3)
GFR, Est AFR Am: >60 mL/min (ref >60)
Glucose, Bld: 193 mg/dL — ABNORMAL HIGH (ref 70–99)
Potassium: 4.1 mmol/L (ref 3.5–5.1)
SODIUM: 136 mmol/L (ref 135–145)
Total Bilirubin: 0.6 mg/dL (ref 0.3–1.2)
Total Protein: 6.7 g/dL (ref 6.5–8.1)

## 2018-04-20 MED ORDER — ENOXAPARIN SODIUM 150 MG/ML ~~LOC~~ SOLN: 150.0000 mg | SUBCUTANEOUS | 2 refills | Status: DC

## 2018-04-20 NOTE — Telephone Encounter (Signed)
Called to set up inpatient admission 8/19--8/23 per [name]. Spoke to [place] in bed placement and made a bed request. Spoke to [place] on [place] inpatient Oncology and made her aware of upcoming inpatient admission for Mark Frey - [facility].

## 2018-04-20 NOTE — Telephone Encounter (Signed)
Printed avs calender of upcoming appointment. Per 8/8 los

## 2018-04-25 DIAGNOSIS — Z7189 Other specified counseling: Secondary | ICD-10-CM | POA: Insufficient documentation

## 2018-04-27 NOTE — Progress Notes (Signed)
HEMATOLOGY/ONCOLOGY CLINIC NOTE  Date of Service: 04/28/18    Patient Care Team: [name], MD as PCP - General (Family Medicine)  CHIEF COMPLAINTS/PURPOSE OF CONSULTATION:  High grade B-cell lymphoma   HISTORY OF PRESENTING ILLNESS:   Mark Frey is a wonderful 62 y.o. male who has been referred to [place] by my colleague Dr. 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 He is accompanied today by his wife. The pt reports that he is doing well overall.   The pt started R-EPOCH every 3 weeks with neulasta support on 03/17/18. He presented to the ED with right leg swelling, found to be a DVT and bilateral PE, which resulted in the incidental finding of a lower abdomen tumor measuring 18.7 x 18.5 x 17.8 cm. He denies any abdominal pains or changes in his bowel habits prior to this finding. He first noted blood in the stools 4-5 days prior to appearing to the ED.  The pt reports that he has been taking 14m Lovenox injections once each day, except for the days in which he had blood in the stools. He hasn't had blood in his stools for the last 7 days.   He has had GI bleed related to bowel involvement with his lymphoma - this is now resolving and his hgb is stabilizing.  He notes that his scrotum and leg swelling has decreased. He denies having CP or SOB at any point from his PE.   Most recent lab results (04/06/18) of CBC w/diff, CMP, Reticulocytes  is as follows: all values are WNL except for WBC at 15.3k, RBC at 3.65, HGB at 10.5, HCT at 31.4, RDW at 17.8, PLT at 640k, ANC at 12.7k, Monocytes abs at 1.6k, Glucose at 162, Total Protein at 6.2, Albumin at 3.1, Total bilirubin at <0.2, Retic ct pct at 2.5%. Uric acid 04/06/18 was low at 3.0 LDH 04/06/18 elevated at 251  On review of systems, pt reports remaining right leg swelling, blood in the stools 7 days ago, two bowel movements  each day, left leg swelling, improved scrotal swelling, eating well, and denies problems with his port, abdominal pains, constipation, diarrhea, jaw pain, pain along the spine, problems passing urine, and any other symptoms.  Interval History:   Mark GOODWYNreturns today for management, evaluation, and treatment of his Burkitt Lymphoma prior to C3 of EPOCH-R. The patient[place] on 04[place]. He is accompanied today by his wife. The pt reports that he is doing well overall.   The pt reports that he has no new complaints or concerns. He denies any abdominal pains or bowel abnormalities. He endorses good energy levels and is ready for his third cycle.   Lab results today (04/28/18) of CBC w/diff, CMP, and Reticulocytes is as follows: all values are WNL except for HGB at 11.4, RBC at 3.66, HGB at 10.6, HCT at 32.6, RDW at 20.8, ANC at 9.1k, Lymphs abs at 700, Monocytes abs at 1.4k, Glucose at 149. 04/28/18 LDH is WNL at 191 04/28/18 Uric acid is WNL at 4.4  On review of systems, pt reports good energy levels, eating well, stable right ankle swelling, and denies back pains, abdominal pains, and any other symptoms.   MEDICAL HISTORY:  Past Medical History:  Diagnosis Date  . ALLERGIC RHINITIS   . Diabetes mellitus   . Hyperlipidemia     SURGICAL  HISTORY: Past Surgical History:  Procedure Laterality Date  . BIOPSY  03/15/2018   Procedure: BIOPSY;  Surgeon: Milus Banister, MD;  Location: [facility];  Service: Endoscopy;;  . CLEFT PALATE REPAIR    . COLONOSCOPY N/A 03/15/2018   Procedure: COLONOSCOPY;  Surgeon: Milus Banister, MD;  Location: [facility];  Service: Endoscopy;  Laterality: N/A;  . deviated septum repair     slight improvement  . ESOPHAGOGASTRODUODENOSCOPY N/A 03/15/2018   Procedure: ESOPHAGOGASTRODUODENOSCOPY (EGD);  Surgeon: Milus Banister, MD;  Location: [name];  Service: Endoscopy;  Laterality: N/A;  . IR IMAGING GUIDED PORT INSERTION  03/17/2018  .  TONSILLECTOMY      SOCIAL HISTORY: Social History   Socioeconomic History  . Marital status: Married    Spouse name: Not on file  . Number of children: Not on file  . Years of education: Not on file  . Highest education level: Not on file  Occupational History  . Not on file  Social Needs  . Financial resource strain: Not on file  . Food insecurity:    Worry: Not on file    Inability: Not on file  . Transportation needs:    Medical: Not on file    Non-medical: Not on file  Tobacco Use  . Smoking status: Never Smoker  . Smokeless tobacco: Never Used  Substance and Sexual Activity  . Alcohol use: Yes    Comment: occasional  . Drug use: No  . Sexual activity: Yes  Lifestyle  . Physical activity:    Days per week: Not on file    Minutes per session: Not on file  . Stress: Not on file  Relationships  . Social connections:    Talks on phone: Not on file    Gets together: Not on file    Attends religious service: Not on file    Active member of club or organization: Not on file    Attends meetings of clubs or organizations: Not on file    Relationship status: Not on file  . Intimate partner violence:    Fear of current or ex partner: Not on file    Emotionally abused: Not on file    Physically abused: Not on file    Forced sexual activity: Not on file  Other Topics Concern  . Not on file  Social History Narrative   Married 1985. No kids. 4 small dogs.       Works in Financial trader, residential      Hobbies: work on cars, Haematologist, exercise as able    FAMILY HISTORY: Family History  Problem Relation Age of Onset  . Lung cancer Mother        smoker  . Brain cancer Mother        metastasis  . AAA (abdominal aortic aneurysm) Father        smoker    ALLERGIES:  is allergic to ciprofloxacin.  MEDICATIONS:  Current Outpatient Medications  Medication Sig Dispense Refill  . acetaminophen (TYLENOL) 325 MG tablet Take 2 tablets (650 mg total) by mouth  every 6 (six) hours as needed for mild pain (or Fever >/= 101).    . blood glucose meter kit and supplies Dispense based on patient and insurance preference. Use daily as directed. (E11.9). 1 each 0  . Choline Fenofibrate 135 MG capsule TAKE 1 CAPSULE DAILY (Patient taking differently: Take one capsule by mouth every evening.) 90 capsule 1  . enoxaparin (LOVENOX) 150 MG/ML injection Inject 1  mL (150 mg total) into the skin daily. 30 Syringe 2  . glucose blood (FREESTYLE TEST STRIPS) test strip Use to check blood sugar daily 100 each 4  . JANUMET 50-1000 MG tablet TAKE 1 TABLET TWICE DAILY  WITH MEALS 180 tablet 3  . lidocaine-prilocaine (EMLA) cream Apply 1 application topically as needed. (Patient taking differently: Apply 1 application topically daily as needed (for treatment.). ) 30 g 2  . senna-docusate (SENOKOT-S) 8.6-50 MG tablet Take 2 tablets by mouth at bedtime. 60 tablet 1  . allopurinol (ZYLOPRIM) 300 MG tablet Take 1 tablet (300 mg total) by mouth daily. (Patient not taking: Reported on 04/28/2018) 30 tablet 0  . ondansetron (ZOFRAN) 8 MG tablet Take 1 tablet (8 mg total) by mouth 2 (two) times daily as needed (Nausea or vomiting). (Patient not taking: Reported on 04/28/2018) 30 tablet 1  . polyethylene glycol (MIRALAX / GLYCOLAX) packet Take 17 g by mouth daily as needed for mild constipation. (Patient not taking: Reported on 04/28/2018) 14 each 0  . prochlorperazine (COMPAZINE) 10 MG tablet Take 1 tablet (10 mg total) by mouth every 6 (six) hours as needed (Nausea or vomiting). (Patient not taking: Reported on 04/28/2018) 30 tablet 1   No current facility-administered medications for this visit.     REVIEW OF SYSTEMS:    A 10+ POINT REVIEW OF SYSTEMS WAS OBTAINED including neurology, dermatology, psychiatry, cardiac, respiratory, lymph, extremities, GI, GU, Musculoskeletal, constitutional, breasts, reproductive, HEENT.  All pertinent positives are noted in the HPI.  All others are  negative.   PHYSICAL EXAMINATION: ECOG PERFORMANCE STATUS: 1 - Symptomatic but completely ambulatory  . Vitals:   04/28/18 1230  BP: 129/80  Pulse: (!) 103  Resp: 18  Temp: 98 F (36.7 C)  SpO2: 99%   Filed Weights   04/28/18 1230  Weight: 233 lb 14.4 oz (106.1 kg)   .Body mass index is 34.54 kg/m.  GENERAL:alert, in no acute distress and comfortable SKIN: rash on bottom, no significant lesions EYES: conjunctiva are pink and non-injected, sclera anicteric OROPHARYNX: MMM, no exudates, no oropharyngeal erythema or ulceration NECK: supple, no JVD LYMPH:  no palpable lymphadenopathy in the cervical, axillary or inguinal regions LUNGS: clear to auscultation b/l with normal respiratory effort HEART: regular rate & rhythm ABDOMEN:  normoactive bowel sounds , non tender, not distended. No palpable hepatosplenomegaly.  Extremity: 1+ Rt pedal edema PSYCH: alert & oriented x 3 with fluent speech NEURO: no focal motor/sensory deficits   LABORATORY DATA:  I have reviewed the data as listed  . CBC Latest Ref Rng & Units 04/28/2018 04/20/2018 04/17/2018  WBC 4.0 - 10.3 K/uL 11.4(H) 3.1(L) 3.3(L)  Hemoglobin 13.0 - 17.1 g/dL 10.6(L) 10.3(L) 10.2(L)  Hematocrit 38.4 - 49.9 % 32.6(L) 32.1(L) 31.7(L)  Platelets 140 - 400 K/uL 391 156 336    . CMP Latest Ref Rng & Units 04/28/2018 04/20/2018 04/17/2018  Glucose 70 - 99 mg/dL 149(H) 193(H) 198(H)  BUN 8 - 23 mg/dL 10 11 15   Creatinine 0.61 - 1.24 mg/dL 0.84 0.77 0.75  Sodium 135 - 145 mmol/L 141 136 136  Potassium 3.5 - 5.1 mmol/L 4.4 4.1 3.9  Chloride 98 - 111 mmol/L 106 100 100  CO2 22 - 32 mmol/L 26 24 25   Calcium 8.9 - 10.3 mg/dL 9.8 9.7 9.3  Total Protein 6.5 - 8.1 g/dL 6.7 6.7 6.1(L)  Total Bilirubin 0.3 - 1.2 mg/dL 0.3 0.6 0.4  Alkaline Phos 38 - 126 U/L 88 61 47  AST  15 - 41 U/L 16 18 18   ALT 0 - 44 U/L 21 47(H) 47(H)   03/17/18 Cytogenetics:   03/17/18 Colon Bx:   03/13/18 Peritoneum Biopsy:     RADIOGRAPHIC STUDIES:  I  have personally reviewed the radiological images as listed and agreed with the findings in the report. Nm Pet Image Initial (pi) Skull Base To Thigh  Result Date: 04/18/2018 CLINICAL DATA:  Initial treatment strategy for Burkitt's lymphoma. EXAM: NUCLEAR MEDICINE PET SKULL BASE TO THIGH TECHNIQUE: 11.6 mCi F-18 FDG was injected intravenously. Full-ring PET imaging was performed from the skull base to thigh after the radiotracer. CT data was obtained and used for attenuation correction and anatomic localization. Fasting blood glucose: 99 mg/dl COMPARISON:  Chest CT 03/10/2018.  CT abdomen venogram 03/22/2018. FINDINGS: Mediastinal blood pool activity: SUV max 2.3 NECK: No hypermetabolic cervical lymph nodes are identified.There are no lesions of the pharyngeal mucosal space. Physiologic activity within the muscles of phonation. Incidental CT findings: none CHEST: There are no hypermetabolic mediastinal, hilar or axillary lymph nodes. No suspicious pulmonary activity. Incidental CT findings: Right IJ Port-A-Cath extends to the superior cavoatrial juncture. Coronary artery atherosclerosis. Aside from mild basilar scarring or atelectasis, the lungs are clear. ABDOMEN/PELVIS: There is no hypermetabolic activity within the liver, adrenal glands, spleen or pancreas. There is no hypermetabolic nodal activity. There is some hypermetabolic activity within the left lower quadrant associated with the sigmoid colon (SUV max 8.9). There are multiple sigmoid colon diverticular changes with wall thickening and surrounding inflammation. Additional scattered bowel activity, within physiologic limits. Right colonic wall thickening noted on previous CTs has markedly improved, and without associated hypermetabolic activity. Incidental CT findings: Overall, the pelvic inflammatory changes have improved. No focal masses or fluid collections are seen. There are nonobstructing right renal calculi. No hydronephrosis. Mild aortic  atherosclerosis. SKELETON: Low level hypermetabolic activity throughout the bones, likely treatment related. In the clivus, the activity appears slightly more prominent (SUV max 4.7). No corresponding lytic lesions seen on the CT images. Incidental CT findings: Low-density in the right femoral vein in the thigh, corresponding with previously demonstrated DVT. IMPRESSION: 1. Continued improvement in right colonic wall thickening and surrounding bulky masses consistent with treated lymphoma. No hypermetabolic activity to suggest residual tumor. 2. There is some hypermetabolic activity associated with the sigmoid colon which demonstrates mild wall thickening, surrounding inflammation and underlying diverticulosis, suggesting mild diverticulitis. 3. Suspected treatment related changes throughout the bone marrow. There is mildly increased activity within the clivus without clear corresponding finding on the CT images. Attention on follow-up recommended. 4. Nonobstructing right renal calculi. 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 Electronically Signed   By: Richardean Sale M.D.   On: 04/18/2018 14:22    ASSESSMENT & PLAN:   62 y.o. male with  1. 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 No BM or CNS involvement.   04/18/18 PET/CT revealed Continued improvement in right colonic wall thickening and surrounding bulky masses consistent with treated lymphoma. No hypermetabolic activity to  suggest residual tumor. There is some  hypermetabolic activity associated with the sigmoid colon which demonstrates mild wall thickening, surrounding inflammation and underlying diverticulosis, suggesting mild diverticulitis. Suspected treatment related changes throughout the bone marrow. There is mildly increased activity within the clivus without clear corresponding finding on the CT images. Attention on follow-up recommended. Nonobstructing right renal calculi. 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   2. RLE DVT and b/l PE  3. GI bleeding from lymphoma involving bowels-- resolved at this time -- will need to monitor with anticoagulation.  PLAN: -Held  intrathecal Methotrexate during C2 to not interrupt blood thinners with current blood clot having gone to lungs and high-risk current blood clot in right leg -Discussed that there may later be a role for ISRT given bulky disease -Recommend infection prevention strategies including crowd avoidance and limiting exposure to individuals with infections and wearing a mask in public -Finish current Allopurinol prescription and then stop -Continue Lovenox-. Will hold Lovenox for 48 hours prior to intrathecal methotrexate -Continue using compression socks -Skin rash on bottom is not overtly concerning for fungal infection, recommend keeping area dry and using OTC antifungal powder and wearing loose clothes -Discussed pt labwork today, 04/28/18; HGB slightly improved to 10.6, ANC at 9.1k, blood chemistries are normal. Normal LDH and Uric acid.  -Hold Lovenox on 04/30/18, one day prior to admission for C3 of EPOCH-R -Will add on intrathecal Methotrexate during C3, as previously decided upon with the pt and his wife -Will see the pt on 05/01/18 with C3D1    -Inpatient admission for EPOCH-R and IT Methotrexate for 5 days from 05/01/2018 -RTC with Dr [name] in 8/30 with labs    All of the patients questions were answered with apparent satisfaction. The patient  knows to call the clinic with any problems, questions or concerns.  The total time spent in the appt was 25 minutes and more than 50% was on counseling and direct patient cares.      Sullivan Lone MD MS AAHIVMS [facility] Hematology/Oncology Physician [employer]  (Office):       [phone] (Work cell):  [phone] (Fax):           [phone]  04/28/2018 12:59 PM  I, Baldwin [place], am acting as a scribe for [name]  .I have reviewed the above documentation for accuracy and completeness, and I agree with the above. [name] MD

## 2018-04-28 ENCOUNTER — Inpatient Hospital Stay (HOSPITAL_BASED_OUTPATIENT_CLINIC_OR_DEPARTMENT_OTHER): Payer: BLUE CROSS/BLUE SHIELD | Admitting: Hematology

## 2018-04-28 ENCOUNTER — Inpatient Hospital Stay: Payer: BLUE CROSS/BLUE SHIELD

## 2018-04-28 ENCOUNTER — Other Ambulatory Visit: Payer: Self-pay

## 2018-04-28 ENCOUNTER — Encounter: Payer: Self-pay | Admitting: Hematology

## 2018-04-28 VITALS — BP 129/80 | HR 103 | Temp 98.0°F | Resp 18 | Ht 69.0 in | Wt 233.9 lb

## 2018-04-28 DIAGNOSIS — K922 Gastrointestinal hemorrhage, unspecified: Secondary | ICD-10-CM

## 2018-04-28 DIAGNOSIS — I2699 Other pulmonary embolism without acute cor pulmonale: Secondary | ICD-10-CM

## 2018-04-28 DIAGNOSIS — I82491 Acute embolism and thrombosis of other specified deep vein of right lower extremity: Secondary | ICD-10-CM

## 2018-04-28 DIAGNOSIS — D500 Iron deficiency anemia secondary to blood loss (chronic): Secondary | ICD-10-CM | POA: Diagnosis not present

## 2018-04-28 DIAGNOSIS — C8373 Burkitt lymphoma, intra-abdominal lymph nodes: Secondary | ICD-10-CM | POA: Diagnosis not present

## 2018-04-28 DIAGNOSIS — C8378 Burkitt lymphoma, lymph nodes of multiple sites: Secondary | ICD-10-CM

## 2018-04-28 DIAGNOSIS — Z79899 Other long term (current) drug therapy: Secondary | ICD-10-CM | POA: Diagnosis not present

## 2018-04-28 DIAGNOSIS — Z7901 Long term (current) use of anticoagulants: Secondary | ICD-10-CM

## 2018-04-28 LAB — CMP (CANCER CENTER ONLY)
ALK PHOS: 88 U/L (ref 38–126)
ALT: 21 U/L (ref 0–44)
ANION GAP: 9 (ref 5–15)
AST: 16 U/L (ref 15–41)
Albumin: 3.7 g/dL (ref 3.5–5.0)
BUN: 10 mg/dL (ref 8–23)
CALCIUM: 9.8 mg/dL (ref 8.9–10.3)
CHLORIDE: 106 mmol/L (ref 98–111)
CO2: 26 mmol/L (ref 22–32)
Creatinine: 0.84 mg/dL (ref 0.61–1.24)
GFR, Estimated: >60 mL/min (ref >60)
Glucose, Bld: 149 mg/dL — ABNORMAL HIGH (ref 70–99)
Potassium: 4.4 mmol/L (ref 3.5–5.1)
SODIUM: 141 mmol/L (ref 135–145)
Total Bilirubin: 0.3 mg/dL (ref 0.3–1.2)
Total Protein: 6.7 g/dL (ref 6.5–8.1)

## 2018-04-28 LAB — CBC WITH DIFFERENTIAL/PLATELET
Basophils Absolute: 0.1 10*3/uL (ref 0.0–0.1)
Basophils Relative: 1 %
Eosinophils Absolute: 0 10*3/uL (ref 0.0–0.5)
Eosinophils Relative: 0 %
HEMATOCRIT: 32.6 % — AB (ref 38.4–49.9)
HEMOGLOBIN: 10.6 g/dL — AB (ref 13.0–17.1)
LYMPHS ABS: 0.7 10*3/uL — AB (ref 0.9–3.3)
Lymphocytes Relative: 6 %
MCH: 28.8 pg (ref 27.2–33.4)
MCHC: 32.4 g/dL (ref 32.0–36.0)
MCV: 88.9 fL (ref 79.3–98.0)
MONOS PCT: 12 %
Monocytes Absolute: 1.4 10*3/uL — ABNORMAL HIGH (ref 0.1–0.9)
NEUTROS ABS: 9.1 10*3/uL — AB (ref 1.5–6.5)
NEUTROS PCT: 81 %
Platelets: 391 10*3/uL (ref 140–400)
RBC: 3.66 MIL/uL — AB (ref 4.20–5.82)
RDW: 20.8 % — ABNORMAL HIGH (ref 11.0–14.6)
WBC: 11.4 10*3/uL — AB (ref 4.0–10.3)

## 2018-04-28 LAB — LACTATE DEHYDROGENASE: LDH: 191 U/L (ref 98–192)

## 2018-04-28 LAB — URIC ACID: Uric Acid, Serum: 4.4 mg/dL (ref 3.7–8.6)

## 2018-05-01 ENCOUNTER — Telehealth: Payer: Self-pay

## 2018-05-01 ENCOUNTER — Inpatient Hospital Stay (HOSPITAL_COMMUNITY)
Admission: AD | Admit: 2018-05-01 | Discharge: 2018-05-05 | DRG: 840 | Disposition: A | Discharge status: Home / Self Care | Payer: BLUE CROSS/BLUE SHIELD | Source: Ambulatory Visit | Attending: Hematology | Admitting: Hematology

## 2018-05-01 ENCOUNTER — Encounter (HOSPITAL_COMMUNITY): Payer: Self-pay

## 2018-05-01 ENCOUNTER — Other Ambulatory Visit: Payer: Self-pay

## 2018-05-01 DIAGNOSIS — Z808 Family history of malignant neoplasm of other organs or systems: Secondary | ICD-10-CM | POA: Diagnosis not present

## 2018-05-01 DIAGNOSIS — N200 Calculus of kidney: Secondary | ICD-10-CM | POA: Diagnosis present

## 2018-05-01 DIAGNOSIS — C8378 Burkitt lymphoma, lymph nodes of multiple sites: Secondary | ICD-10-CM | POA: Diagnosis not present

## 2018-05-01 DIAGNOSIS — E119 Type 2 diabetes mellitus without complications: Secondary | ICD-10-CM

## 2018-05-01 DIAGNOSIS — I824Z9 Acute embolism and thrombosis of unspecified deep veins of unspecified distal lower extremity: Secondary | ICD-10-CM

## 2018-05-01 DIAGNOSIS — Z7984 Long term (current) use of oral hypoglycemic drugs: Secondary | ICD-10-CM | POA: Diagnosis not present

## 2018-05-01 DIAGNOSIS — I82411 Acute embolism and thrombosis of right femoral vein: Secondary | ICD-10-CM | POA: Diagnosis not present

## 2018-05-01 DIAGNOSIS — J309 Allergic rhinitis, unspecified: Secondary | ICD-10-CM | POA: Diagnosis present

## 2018-05-01 DIAGNOSIS — I2699 Other pulmonary embolism without acute cor pulmonale: Secondary | ICD-10-CM | POA: Diagnosis present

## 2018-05-01 DIAGNOSIS — Z801 Family history of malignant neoplasm of trachea, bronchus and lung: Secondary | ICD-10-CM | POA: Diagnosis not present

## 2018-05-01 DIAGNOSIS — Z881 Allergy status to other antibiotic agents status: Secondary | ICD-10-CM | POA: Diagnosis not present

## 2018-05-01 DIAGNOSIS — E1165 Type 2 diabetes mellitus with hyperglycemia: Secondary | ICD-10-CM | POA: Diagnosis not present

## 2018-05-01 DIAGNOSIS — C8370 Burkitt lymphoma, unspecified site: Secondary | ICD-10-CM | POA: Diagnosis not present

## 2018-05-01 DIAGNOSIS — E785 Hyperlipidemia, unspecified: Secondary | ICD-10-CM | POA: Diagnosis present

## 2018-05-01 DIAGNOSIS — K922 Gastrointestinal hemorrhage, unspecified: Secondary | ICD-10-CM | POA: Diagnosis not present

## 2018-05-01 DIAGNOSIS — Z5111 Encounter for antineoplastic chemotherapy: Secondary | ICD-10-CM | POA: Diagnosis not present

## 2018-05-01 DIAGNOSIS — T380X5A Adverse effect of glucocorticoids and synthetic analogues, initial encounter: Secondary | ICD-10-CM | POA: Diagnosis not present

## 2018-05-01 DIAGNOSIS — Z86718 Personal history of other venous thrombosis and embolism: Secondary | ICD-10-CM

## 2018-05-01 DIAGNOSIS — E138 Other specified diabetes mellitus with unspecified complications: Secondary | ICD-10-CM

## 2018-05-01 DIAGNOSIS — I824Z1 Acute embolism and thrombosis of unspecified deep veins of right distal lower extremity: Secondary | ICD-10-CM | POA: Diagnosis not present

## 2018-05-01 DIAGNOSIS — C8510 Unspecified B-cell lymphoma, unspecified site: Secondary | ICD-10-CM

## 2018-05-01 DIAGNOSIS — R609 Edema, unspecified: Secondary | ICD-10-CM | POA: Diagnosis not present

## 2018-05-01 DIAGNOSIS — Z7189 Other specified counseling: Secondary | ICD-10-CM

## 2018-05-01 LAB — PROTIME-INR
INR: 0.88
Prothrombin Time: 11.9 seconds (ref 11.4–15.2)

## 2018-05-01 LAB — CBC WITH DIFFERENTIAL/PLATELET
BASOS PCT: 1 %
Basophils Absolute: 0.1 10*3/uL (ref 0.0–0.1)
Eosinophils Absolute: 0 10*3/uL (ref 0.0–0.7)
Eosinophils Relative: 0 %
HEMATOCRIT: 34.3 % — AB (ref 39.0–52.0)
HEMOGLOBIN: 10.9 g/dL — AB (ref 13.0–17.0)
Lymphocytes Relative: 17 %
Lymphs Abs: 1.3 10*3/uL (ref 0.7–4.0)
MCH: 29.1 pg (ref 26.0–34.0)
MCHC: 31.8 g/dL (ref 30.0–36.0)
MCV: 91.7 fL (ref 78.0–100.0)
MONOS PCT: 4 %
Monocytes Absolute: 0.3 10*3/uL (ref 0.1–1.0)
NEUTROS ABS: 6.2 10*3/uL (ref 1.7–7.7)
NEUTROS PCT: 78 %
Platelets: 447 10*3/uL — ABNORMAL HIGH (ref 150–400)
RBC: 3.74 MIL/uL — ABNORMAL LOW (ref 4.22–5.81)
RDW: 20.9 % — ABNORMAL HIGH (ref 11.5–15.5)
WBC: 8 10*3/uL (ref 4.0–10.5)

## 2018-05-01 LAB — APTT: APTT: 24 s (ref 24–36)

## 2018-05-01 LAB — COMPREHENSIVE METABOLIC PANEL
ALBUMIN: 3.9 g/dL (ref 3.5–5.0)
ALT: 21 U/L (ref 0–44)
AST: 20 U/L (ref 15–41)
Alkaline Phosphatase: 57 U/L (ref 38–126)
Anion gap: 8 (ref 5–15)
BUN: 14 mg/dL (ref 8–23)
CHLORIDE: 106 mmol/L (ref 98–111)
CO2: 27 mmol/L (ref 22–32)
Calcium: 10 mg/dL (ref 8.9–10.3)
Creatinine, Ser: 0.88 mg/dL (ref 0.61–1.24)
GFR calc Af Amer: >60 mL/min (ref >60)
Glucose, Bld: 186 mg/dL — ABNORMAL HIGH (ref 70–99)
POTASSIUM: 4.3 mmol/L (ref 3.5–5.1)
SODIUM: 141 mmol/L (ref 135–145)
Total Bilirubin: 0.4 mg/dL (ref 0.3–1.2)
Total Protein: 6.9 g/dL (ref 6.5–8.1)

## 2018-05-01 LAB — MAGNESIUM: MAGNESIUM: 1.6 mg/dL — AB (ref 1.7–2.4)

## 2018-05-01 LAB — PHOSPHORUS: Phosphorus: 3.1 mg/dL (ref 2.5–4.6)

## 2018-05-01 MED ORDER — VINCRISTINE SULFATE CHEMO INJECTION 1 MG/ML
Freq: Once | INTRAVENOUS | Status: AC
  Administered 2018-05-01: 14:00:00 via INTRAVENOUS
  Filled 2018-05-01: qty 12

## 2018-05-01 MED ORDER — ACETAMINOPHEN 325 MG PO TABS: 650.0000 mg | ORAL_TABLET | Freq: Four times a day (QID) | ORAL | Status: DC | PRN

## 2018-05-01 MED ORDER — LINAGLIPTIN 5 MG PO TABS
5.0000 mg | ORAL_TABLET | Freq: Every day | ORAL | Status: DC
  Administered 2018-05-01 – 2018-05-05 (×5): 5 mg via ORAL
  Filled 2018-05-01 (×5): qty 1

## 2018-05-01 MED ORDER — HEPARIN SOD (PORK) LOCK FLUSH 100 UNIT/ML IV SOLN: 250.0000 [IU] | Freq: Once | INTRAVENOUS | Status: DC | PRN

## 2018-05-01 MED ORDER — HOT PACK MISC ONCOLOGY
1.0000 | Freq: Once | Status: DC | PRN
  Filled 2018-05-01: qty 1

## 2018-05-01 MED ORDER — POLYETHYLENE GLYCOL 3350 17 G PO PACK: 17.0000 g | PACK | Freq: Every day | ORAL | Status: DC | PRN

## 2018-05-01 MED ORDER — SENNOSIDES-DOCUSATE SODIUM 8.6-50 MG PO TABS
2.0000 | ORAL_TABLET | Freq: Every day | ORAL | Status: DC
  Administered 2018-05-02 – 2018-05-04 (×3): 2 via ORAL
  Filled 2018-05-01 (×4): qty 2

## 2018-05-01 MED ORDER — ONDANSETRON HCL 40 MG/20ML IJ SOLN
Freq: Once | INTRAMUSCULAR | Status: AC
  Administered 2018-05-01: 8 mg via INTRAVENOUS
  Filled 2018-05-01: qty 4

## 2018-05-01 MED ORDER — ALTEPLASE 2 MG IJ SOLR
2.0000 mg | Freq: Once | INTRAMUSCULAR | Status: DC | PRN
  Filled 2018-05-01: qty 2

## 2018-05-01 MED ORDER — SODIUM CHLORIDE 0.9 % IV SOLN
INTRAVENOUS | Status: DC
  Administered 2018-05-01 – 2018-05-05 (×2): via INTRAVENOUS

## 2018-05-01 MED ORDER — COLD PACK MISC ONCOLOGY
1.0000 | Freq: Once | Status: DC | PRN
  Filled 2018-05-01: qty 1

## 2018-05-01 MED ORDER — METFORMIN HCL 500 MG PO TABS
1000.0000 mg | ORAL_TABLET | Freq: Two times a day (BID) | ORAL | Status: DC
Start: 2018-05-01 — End: 2018-05-05
  Administered 2018-05-01 – 2018-05-05 (×9): 1000 mg via ORAL
  Filled 2018-05-01 (×10): qty 2

## 2018-05-01 MED ORDER — HEPARIN SOD (PORK) LOCK FLUSH 100 UNIT/ML IV SOLN
500.0000 [IU] | Freq: Once | INTRAVENOUS | Status: AC | PRN
  Administered 2018-05-05: 500 [IU]
  Filled 2018-05-01: qty 5

## 2018-05-01 MED ORDER — SODIUM CHLORIDE 0.9% FLUSH: 10.0000 mL | INTRAVENOUS | Status: DC | PRN

## 2018-05-01 MED ORDER — SITAGLIPTIN-METFORMIN HCL 50-1000 MG PO TABS
1.0000 | ORAL_TABLET | Freq: Two times a day (BID) | ORAL | Status: DC
Start: 2018-05-01 — End: 2018-05-01

## 2018-05-01 MED ORDER — SODIUM CHLORIDE 0.9% FLUSH: 3.0000 mL | INTRAVENOUS | Status: DC | PRN

## 2018-05-01 MED ORDER — PREDNISONE 5 MG PO TABS
30.0000 mg | ORAL_TABLET | Freq: Two times a day (BID) | ORAL | Status: DC
  Administered 2018-05-01 – 2018-05-05 (×9): 30 mg via ORAL
  Filled 2018-05-01 (×9): qty 1

## 2018-05-01 MED ORDER — LIDOCAINE-PRILOCAINE 2.5-2.5 % EX CREA: 1.0000 "application " | TOPICAL_CREAM | CUTANEOUS | Status: DC | PRN

## 2018-05-01 NOTE — H&P (Signed)
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 He is accompanied today by his wife at bedside. The pt reports that he is doing very well overall.   The pt notes that he has no new concerns and is ready to begin C3 of EPOCH-R. His last dose of Lovenox was Saturday night 04/29/18, in anticipation of intrathecal methotrexate tomorrow 05/02/18. He notes that his right leg swelling is stable, as are his energy levels. He continues to deny abdominal pains, blood in the stools and other bowel abnormalities.  He took his last dose of Lovenox Saturday night.  Lab results today (05/01/18) of CBC w/diff, CMP is as follows: all values are WNL except for RBC at 3.74, HGB at 10.9, HCT at 34.3, RDW at 20.9, PLT at 447k, Glucose at 186.  On review of systems, pt reports good energy levels, stable right leg swelling, sunburn on cheeks, moving his bowels, eating well, and denies headaches, other skin rashes, new fatigue, fevers, chills, night sweats, abdominal pains, blood in the stools, and any other symptoms.    MEDICAL HISTORY:  Past Medical History:  Diagnosis Date  . ALLERGIC RHINITIS   . Diabetes mellitus   . Hyperlipidemia     SURGICAL HISTORY: Past Surgical History:  Procedure Laterality Date  . BIOPSY  03/15/2018   Procedure: BIOPSY;  Surgeon: Milus Banister, MD;  Location: [facility];  Service: Endoscopy;;  . CLEFT PALATE REPAIR    . COLONOSCOPY N/A 03/15/2018   Procedure: COLONOSCOPY;  Surgeon: Milus Banister, MD;  Location: [facility];  Service: Endoscopy;  Laterality: N/A;  . deviated septum repair     slight improvement  .  ESOPHAGOGASTRODUODENOSCOPY N/A 03/15/2018   Procedure: ESOPHAGOGASTRODUODENOSCOPY (EGD);  Surgeon: Milus Banister, MD;  Location: [name];  Service: Endoscopy;  Laterality: N/A;  . IR IMAGING GUIDED PORT INSERTION  03/17/2018  . TONSILLECTOMY      SOCIAL HISTORY: Social History   Socioeconomic History  . Marital status: Married    Spouse name: Not on file  . Number of children: Not on file  . Years of education: Not on file  . Highest education level: Not on file  Occupational History  . Not on file  Social Needs  . Financial resource strain: Not on file  . Food insecurity:    Worry: Not on file    Inability: Not on file  . Transportation needs:    Medical: Not on file    Non-medical: Not on file  Tobacco Use  . Smoking status: Never Smoker  . Smokeless tobacco: Never Used  Substance and Sexual Activity  . Alcohol use: Yes    Comment: occasional  . Drug use: No  . Sexual activity: Yes  Lifestyle  . Physical activity:    Days per week: Not on file    Minutes per session: Not on file  . Stress: Not on file  Relationships  . Social connections:    Talks on phone: Not on file    Gets together: Not on file    Attends religious service: Not on file    Active member of club or organization: Not on file  Attends meetings of clubs or organizations: Not on file    Relationship status: Not on file  . Intimate partner violence:    Fear of current or ex partner: Not on file    Emotionally abused: Not on file    Physically abused: Not on file    Forced sexual activity: Not on file  Other Topics Concern  . Not on file  Social History Narrative   Married 1985. No kids. 4 small dogs.       Works in Financial trader, residential      Hobbies: work on cars, Haematologist, exercise as able    FAMILY HISTORY: Family History  Problem Relation Age of Onset  . Lung cancer Mother        smoker  . Brain cancer Mother        metastasis  . AAA (abdominal aortic aneurysm)  Father        smoker    ALLERGIES:  is allergic to ciprofloxacin.  MEDICATIONS:  Current Facility-Administered Medications  Medication Dose Route Frequency Provider Last Rate Last Dose  . 0.9 %  sodium chloride infusion   Intravenous Continuous [name], MD 10 mL/hr at 05/01/18 1313    . acetaminophen (TYLENOL) tablet 650 mg  650 mg Oral Q6H PRN [name], MD      . alteplase (CATHFLO ACTIVASE) injection 2 mg  2 mg Intracatheter Once PRN [name], MD      . Cold Pack 1 packet  1 packet Topical Once PRN [name], MD      . DOXOrubicin (ADRIAMYCIN) 24 mg, etoposide (VEPESID) 120 mg, vinCRIStine (ONCOVIN) 1 mg in sodium chloride 0.9 % 600 mL chemo infusion   Intravenous Once [name], MD      . heparin lock flush 100 unit/mL  500 Units Intracatheter Once PRN [name], MD      . heparin lock flush 100 unit/mL  250 Units Intracatheter Once PRN [name], MD      . Hot Pack 1 packet  1 packet Topical Once PRN [name], MD      . lidocaine-prilocaine (EMLA) cream 1 application  1 application Topical PRN [name], MD      . linagliptin (TRADJENTA) tablet 5 mg  5 mg Oral Daily [name], MD   5 mg at 05/01/18 1308   And  . metFORMIN (GLUCOPHAGE) tablet 1,000 mg  1,000 mg Oral BID WC [name], MD   1,000 mg at 05/01/18 1308  . ondansetron (ZOFRAN) 8 mg, dexamethasone (DECADRON) 10 mg in sodium chloride 0.9 % 50 mL IVPB   Intravenous Once [name], Cloria [place], MD      . polyethylene glycol (MIRALAX / GLYCOLAX) packet 17 g  17 g Oral Daily PRN [name], MD      . predniSONE (DELTASONE) tablet 30 mg  30 mg Oral BID WC [name], MD      . senna-docusate (Senokot-S) tablet 2 tablet  2 tablet Oral QHS [name], MD      . sodium chloride flush (NS) 0.9 % injection 10 mL  10 mL Intracatheter PRN [name], MD      . sodium chloride  flush (NS) 0.9 % injection 3 mL  3 mL Intracatheter PRN [name], MD        REVIEW OF SYSTEMS:    10 Point review of Systems was done is negative  except as noted above.  PHYSICAL EXAMINATION: ECOG PERFORMANCE STATUS: 1 - Symptomatic but completely ambulatory  . Vitals:   05/01/18 1011  BP: 134/84  Pulse: 97  Resp: 20  Temp: 98.6 F (37 C)  SpO2: 96%   Filed Weights   05/01/18 1011  Weight: 234 lb (106.1 kg)   .Body mass index is 34.56 kg/m.  GENERAL:lert, in no acute distress and comfortable SKIN: rash on bottom, no significant lesions EYES: conjunctiva are pink and non-injected, sclera anicteric OROPHARYNX: MMM, no exudates, no oropharyngeal erythema or ulceration NECK: supple, no JVD LYMPH:  no palpable lymphadenopathy in the cervical, axillary or inguinal regions LUNGS: clear to auscultation b/l with normal respiratory effort HEART: regular rate & rhythm ABDOMEN:  normoactive bowel sounds , non tender, not distended. Extremity: 1+ Rt pedal edema PSYCH: alert & oriented x 3 with fluent speech NEURO: no focal motor/sensory deficits  LABORATORY DATA:  I have reviewed the data as listed  . CBC Latest Ref Rng & Units 05/01/2018 04/28/2018 04/20/2018  WBC 4.0 - 10.5 K/uL 8.0 11.4(H) 3.1(L)  Hemoglobin 13.0 - 17.0 g/dL 10.9(L) 10.6(L) 10.3(L)  Hematocrit 39.0 - 52.0 % 34.3(L) 32.6(L) 32.1(L)  Platelets 150 - 400 K/uL 447(H) 391 156    . CMP Latest Ref Rng & Units 05/01/2018 04/28/2018 04/20/2018  Glucose 70 - 99 mg/dL 186(H) 149(H) 193(H)  BUN 8 - 23 mg/dL 14 10 11   Creatinine 0.61 - 1.24 mg/dL 0.88 0.84 0.77  Sodium 135 - 145 mmol/L 141 141 136  Potassium 3.5 - 5.1 mmol/L 4.3 4.4 4.1  Chloride 98 - 111 mmol/L 106 106 100  CO2 22 - 32 mmol/L 27 26 24   Calcium 8.9 - 10.3 mg/dL 10.0 9.8 9.7  Total Protein 6.5 - 8.1 g/dL 6.9 6.7 6.7  Total Bilirubin 0.3 - 1.2 mg/dL 0.4 0.3 0.6  Alkaline Phos 38 - 126 U/L 57 88 61  AST 15 - 41 U/L 20 16 18   ALT 0 - 44 U/L  21 21 47(H)     RADIOGRAPHIC STUDIES: I have personally reviewed the radiological images as listed and agreed with the findings in the report. Nm Pet Image Initial (pi) Skull Base To Thigh  Result Date: 04/18/2018 CLINICAL DATA:  Initial treatment strategy for Burkitt's lymphoma. EXAM: NUCLEAR MEDICINE PET SKULL BASE TO THIGH TECHNIQUE: 11.6 mCi F-18 FDG was injected intravenously. Full-ring PET imaging was performed from the skull base to thigh after the radiotracer. CT data was obtained and used for attenuation correction and anatomic localization. Fasting blood glucose: 99 mg/dl COMPARISON:  Chest CT 03/10/2018.  CT abdomen venogram 03/22/2018. FINDINGS: Mediastinal blood pool activity: SUV max 2.3 NECK: No hypermetabolic cervical lymph nodes are identified.There are no lesions of the pharyngeal mucosal space. Physiologic activity within the muscles of phonation. Incidental CT findings: none CHEST: There are no hypermetabolic mediastinal, hilar or axillary lymph nodes. No suspicious pulmonary activity. Incidental CT findings: Right IJ Port-A-Cath extends to the superior cavoatrial juncture. Coronary artery atherosclerosis. Aside from mild basilar scarring or atelectasis, the lungs are clear. ABDOMEN/PELVIS: There is no hypermetabolic activity within the liver, adrenal glands, spleen or pancreas. There is no hypermetabolic nodal activity. There is some hypermetabolic activity within the left lower quadrant associated with the sigmoid colon (SUV max 8.9). There are multiple sigmoid colon diverticular changes with wall thickening and surrounding inflammation. Additional scattered bowel activity, within physiologic limits. Right colonic wall thickening noted on previous CTs has markedly improved, and without associated hypermetabolic activity. Incidental CT  findings: Overall, the pelvic inflammatory changes have improved. No focal masses or fluid collections are seen. There are nonobstructing right renal  calculi. No hydronephrosis. Mild aortic atherosclerosis. SKELETON: Low level hypermetabolic activity throughout the bones, likely treatment related. In the clivus, the activity appears slightly more prominent (SUV max 4.7). No corresponding lytic lesions seen on the CT images. Incidental CT findings: Low-density in the right femoral vein in the thigh, corresponding with previously demonstrated DVT. IMPRESSION: 1. Continued improvement in right colonic wall thickening and surrounding bulky masses consistent with treated lymphoma. No hypermetabolic activity to suggest residual tumor. 2. There is some hypermetabolic activity associated with the sigmoid colon which demonstrates mild wall thickening, surrounding inflammation and underlying diverticulosis, suggesting mild diverticulitis. 3. Suspected treatment related changes throughout the bone marrow. There is mildly increased activity within the clivus without clear corresponding finding on the CT images. Attention on follow-up recommended. 4. Nonobstructing right renal calculi. 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 Electronically Signed   By: Richardean Sale M.D.   On: 04/18/2018 14:22    ASSESSMENT & PLAN:  62 y.o. male with  1. 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 No BM or CNS involvement.   04/18/18 PET/CT revealed Continued improvement in right colonic wall thickening and surrounding bulky masses consistent with treated  lymphoma. No hypermetabolic activity to suggest residual tumor. There is some hypermetabolic activity associated with the sigmoid colon which demonstrates mild wall thickening, surrounding inflammation and underlying diverticulosis, suggesting mild diverticulitis. Suspected treatment related changes throughout the bone marrow. There is mildly increased activity within the clivus without clear corresponding finding on the CT images. Attention on follow-up recommended. Nonobstructing right renal calculi. 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   2. RLE DVT and b/l PE  3. GI bleeding from lymphoma involving bowels-- resolved at this time -- will need to monitor with anticoagulation.  PLAN:  -Discussed and reviewed pt labwork today, 05/01/18 -Pt has no prohibitive toxicities from beginning C3D1 [place] today. Chemotherapy orders reviewed and signed and disucssed with pharmacist. -Intrathecal Methotrexate tomorrow morning 05/02/18 - IR request placed and chemotherapy signed. -dexamethasone prior to IT MTX. Also hydrocortisone with IT Methotrexate. -lovenox on hold for IT MTX (last dose was Saturday evening) -Discussed that there may later be a role for ISRT given bulky disease -daily labs -off allopurinol now given low risk of tumor lysis at this time. -will restart Lovenox prophylaxis and then therapeutic lovenox post IT MTX   All of the patients questions were answered with apparent satisfaction. The patient knows to call the clinic with any problems, questions or concerns.  The total time spent in the appt was 45 minutes and more than 50% was on counseling and direct patient cares and co-ordination of cares    Sullivan Lone MD [place] AAHIV[facility] Hematology/Oncology Physician [facility]  (Office):       [phone] (Work cell):  [phone] (Fax):           [phone]  05/01/2018 1:30 PM  I, Baldwin [place], am  acting as a scribe for [name]  .I have reviewed the above  documentation for accuracy and completeness, and I agree with the above. Sullivan Lone MD MS

## 2018-05-01 NOTE — Progress Notes (Signed)
Doses and dilutions for Doxorubicin/Vepesid and Oncovin verified with Nancy Marus, RN.

## 2018-05-01 NOTE — Telephone Encounter (Signed)
Called patient to confirm appointment that was scheduled for 8/30 per [facility] requested date. Per 8/16 los

## 2018-05-02 ENCOUNTER — Inpatient Hospital Stay (HOSPITAL_COMMUNITY): Payer: BLUE CROSS/BLUE SHIELD

## 2018-05-02 DIAGNOSIS — I82411 Acute embolism and thrombosis of right femoral vein: Secondary | ICD-10-CM

## 2018-05-02 DIAGNOSIS — Z86718 Personal history of other venous thrombosis and embolism: Secondary | ICD-10-CM

## 2018-05-02 DIAGNOSIS — Z7984 Long term (current) use of oral hypoglycemic drugs: Secondary | ICD-10-CM

## 2018-05-02 DIAGNOSIS — E1165 Type 2 diabetes mellitus with hyperglycemia: Secondary | ICD-10-CM

## 2018-05-02 LAB — CBC
HEMATOCRIT: 34.3 % — AB (ref 39.0–52.0)
HEMOGLOBIN: 11.1 g/dL — AB (ref 13.0–17.0)
MCH: 29.3 pg (ref 26.0–34.0)
MCHC: 32.4 g/dL (ref 30.0–36.0)
MCV: 90.5 fL (ref 78.0–100.0)
PLATELETS: 526 10*3/uL — AB (ref 150–400)
RBC: 3.79 MIL/uL — AB (ref 4.22–5.81)
RDW: 20.7 % — ABNORMAL HIGH (ref 11.5–15.5)
WBC: 13.3 10*3/uL — AB (ref 4.0–10.5)

## 2018-05-02 LAB — BASIC METABOLIC PANEL
ANION GAP: 8 (ref 5–15)
BUN: 17 mg/dL (ref 8–23)
CHLORIDE: 105 mmol/L (ref 98–111)
CO2: 25 mmol/L (ref 22–32)
Calcium: 9.6 mg/dL (ref 8.9–10.3)
Creatinine, Ser: 0.85 mg/dL (ref 0.61–1.24)
GFR calc Af Amer: >60 mL/min (ref >60)
GLUCOSE: 262 mg/dL — AB (ref 70–99)
POTASSIUM: 4.5 mmol/L (ref 3.5–5.1)
Sodium: 138 mmol/L (ref 135–145)

## 2018-05-02 MED ORDER — SODIUM CHLORIDE 0.9 % IV SOLN
Freq: Once | INTRAVENOUS | Status: AC
  Administered 2018-05-02: 8 mg via INTRAVENOUS
  Filled 2018-05-02: qty 4

## 2018-05-02 MED ORDER — ENOXAPARIN SODIUM 40 MG/0.4ML ~~LOC~~ SOLN
40.0000 mg | SUBCUTANEOUS | Status: DC
  Administered 2018-05-03: 40 mg via SUBCUTANEOUS
  Filled 2018-05-02 (×2): qty 0.4

## 2018-05-02 MED ORDER — SODIUM CHLORIDE 0.9 % IJ SOLN
Freq: Once | INTRAMUSCULAR | Status: AC
  Administered 2018-05-02: 12:00:00 via INTRATHECAL
  Filled 2018-05-02: qty 0.48

## 2018-05-02 MED ORDER — LIDOCAINE HCL 1 % IJ SOLN
INTRAMUSCULAR | Status: AC
  Filled 2018-05-02: qty 20

## 2018-05-02 MED ORDER — VINCRISTINE SULFATE CHEMO INJECTION 1 MG/ML
Freq: Once | INTRAVENOUS | Status: AC
  Administered 2018-05-02: 14:00:00 via INTRAVENOUS
  Filled 2018-05-02: qty 12

## 2018-05-02 NOTE — Progress Notes (Signed)
HEMATOLOGY/ONCOLOGY INPATIENT PROGRESS NOTE  Date of Service: 05/02/2018  Inpatient Attending: .[name], MD   SUBJECTIVE:   Mark Frey  is accompanied today by his wife at bedside. The pt reports that he is doing well overall and reports tolerating intrathecal methotrexate very well.   The pt reports that he is comfortable lying down after intrathecal injection. He continues to tolerate C3 EPOCH very well and notes no new concerns. He continues eating well and has stable energy levels. He notes that his "belly feels better than it ever has."   Lab results today (05/02/18) of CBC , BMP, and is as follows: all values are WNL except for WBC at 13.3k, RBC at 3.79, HGB at 11.1, HCT at 34.3, RDW at 20.7, PLT at 526k, Glucose at 262.  On review of systems, pt reports stable energy levels, general comfort, stable leg swelling, and denies abdominal pains, blood in the stools, bone pain, back pain, and any other symptoms.    OBJECTIVE:  NAD  PHYSICAL EXAMINATION: . Vitals:   05/01/18 1524 05/01/18 2238 05/02/18 0456 05/02/18 1304  BP: 134/77 126/87 138/87   Pulse: (!) 101 (!) 111 97   Resp: 18 20 20    Temp: 98.4 F (36.9 C) 98.3 F (36.8 C) 98.3 F (36.8 C)   TempSrc: Oral Oral Oral   SpO2: 96% 97% 98% 94%  Weight:      Height:       Filed Weights   05/01/18 1011  Weight: 234 lb (106.1 kg)   .Body mass index is 34.56 kg/m.  [name] GENERAL:alert, in no acute distress and comfortable SKIN: no acute rashes, no significant lesions EYES: conjunctiva are pink and non-injected, sclera anicteric OROPHARYNX: MMM, no exudates, no oropharyngeal erythema or ulceration NECK: supple, no JVD LYMPH:  no palpable lymphadenopathy in the cervical, axillary or inguinal regions LUNGS: clear to auscultation b/l with normal respiratory effort HEART: regular rate & rhythm ABDOMEN:  normoactive bowel sounds , non tender, not distended. Extremity: 2+ pedal edema rt leg and 1+ left  leg PSYCH: alert & oriented x 3 with fluent speech NEURO: no focal motor/sensory deficits   MEDICAL HISTORY:  Past Medical History:  Diagnosis Date  . ALLERGIC RHINITIS   . Diabetes mellitus   . Hyperlipidemia     SURGICAL HISTORY: Past Surgical History:  Procedure Laterality Date  . BIOPSY  03/15/2018   Procedure: BIOPSY;  Surgeon: Milus Banister, MD;  Location: [facility];  Service: Endoscopy;;  . CLEFT PALATE REPAIR    . COLONOSCOPY N/A 03/15/2018   Procedure: COLONOSCOPY;  Surgeon: Milus Banister, MD;  Location: [facility];  Service: Endoscopy;  Laterality: N/A;  . deviated septum repair     slight improvement  . ESOPHAGOGASTRODUODENOSCOPY N/A 03/15/2018   Procedure: ESOPHAGOGASTRODUODENOSCOPY (EGD);  Surgeon: Milus Banister, MD;  Location: [name];  Service: Endoscopy;  Laterality: N/A;  . IR IMAGING GUIDED PORT INSERTION  03/17/2018  . TONSILLECTOMY      SOCIAL HISTORY: Social History   Socioeconomic History  . Marital status: Married    Spouse name: Not on file  . Number of children: Not on file  . Years of education: Not on file  . Highest education level: Not on file  Occupational History  . Not on file  Social Needs  . Financial resource strain: Not on file  . Food insecurity:    Worry: Not on file    Inability: Not on file  . Transportation needs:  Medical: Not on file    Non-medical: Not on file  Tobacco Use  . Smoking status: Never Smoker  . Smokeless tobacco: Never Used  Substance and Sexual Activity  . Alcohol use: Yes    Comment: occasional  . Drug use: No  . Sexual activity: Yes  Lifestyle  . Physical activity:    Days per week: Not on file    Minutes per session: Not on file  . Stress: Not on file  Relationships  . Social connections:    Talks on phone: Not on file    Gets together: Not on file    Attends religious service: Not on file    Active member of club or organization: Not on file    Attends meetings of clubs or  organizations: Not on file    Relationship status: Not on file  . Intimate partner violence:    Fear of current or ex partner: Not on file    Emotionally abused: Not on file    Physically abused: Not on file    Forced sexual activity: Not on file  Other Topics Concern  . Not on file  Social History Narrative   Married 1985. No kids. 4 small dogs.       Works in Financial trader, residential      Hobbies: work on cars, Haematologist, exercise as able    FAMILY HISTORY: Family History  Problem Relation Age of Onset  . Lung cancer Mother        smoker  . Brain cancer Mother        metastasis  . AAA (abdominal aortic aneurysm) Father        smoker    ALLERGIES:  is allergic to ciprofloxacin.  MEDICATIONS:  Scheduled Meds: . DOXOrubicin/vinCRIStine/etoposide CHEMO IV infusion for Inpatient CI   Intravenous Once  . DOXOrubicin/vinCRIStine/etoposide CHEMO IV infusion for Inpatient CI   Intravenous Once  . linagliptin  5 mg Oral Daily   And  . metFORMIN  1,000 mg Oral BID WC  . methotrexate INTRATHECAL (+/- HYDROCORTISONE,Ara-C)   Intrathecal Once  . predniSONE  30 mg Oral BID WC  . senna-docusate  2 tablet Oral QHS   Continuous Infusions: . sodium chloride 10 mL/hr at 05/01/18 1700  . ondansetron (ZOFRAN) with dexamethasone (DECADRON) IV     PRN Meds:.acetaminophen, alteplase, Cold Pack, heparin lock flush, heparin lock flush, Hot Pack, lidocaine-prilocaine, polyethylene glycol, sodium chloride flush, sodium chloride flush  REVIEW OF SYSTEMS:    10 Point review of Systems was done is negative except as noted above.   LABORATORY DATA:  I have reviewed the data as listed  . CBC Latest Ref Rng & Units 05/02/2018 05/01/2018 04/28/2018  WBC 4.0 - 10.5 K/uL 13.3(H) 8.0 11.4(H)  Hemoglobin 13.0 - 17.0 g/dL 11.1(L) 10.9(L) 10.6(L)  Hematocrit 39.0 - 52.0 % 34.3(L) 34.3(L) 32.6(L)  Platelets 150 - 400 K/uL 526(H) 447(H) 391    . CMP Latest Ref Rng & Units 05/02/2018  05/01/2018 04/28/2018  Glucose 70 - 99 mg/dL 262(H) 186(H) 149(H)  BUN 8 - 23 mg/dL 17 14 10   Creatinine 0.61 - 1.24 mg/dL 0.85 0.88 0.84  Sodium 135 - 145 mmol/L 138 141 141  Potassium 3.5 - 5.1 mmol/L 4.5 4.3 4.4  Chloride 98 - 111 mmol/L 105 106 106  CO2 22 - 32 mmol/L 25 27 26   Calcium 8.9 - 10.3 mg/dL 9.6 10.0 9.8  Total Protein 6.5 - 8.1 g/dL - 6.9 6.7  Total Bilirubin 0.3 -  1.2 mg/dL - 0.4 0.3  Alkaline Phos 38 - 126 U/L - 57 88  AST 15 - 41 U/L - 20 16  ALT 0 - 44 U/L - 21 21     RADIOGRAPHIC STUDIES: I have personally reviewed the radiological images as listed and agreed with the findings in the report. Nm Pet Image Initial (pi) Skull Base To Thigh  Result Date: 04/18/2018 CLINICAL DATA:  Initial treatment strategy for Burkitt's lymphoma. EXAM: NUCLEAR MEDICINE PET SKULL BASE TO THIGH TECHNIQUE: 11.6 mCi F-18 FDG was injected intravenously. Full-ring PET imaging was performed from the skull base to thigh after the radiotracer. CT data was obtained and used for attenuation correction and anatomic localization. Fasting blood glucose: 99 mg/dl COMPARISON:  Chest CT 03/10/2018.  CT abdomen venogram 03/22/2018. FINDINGS: Mediastinal blood pool activity: SUV max 2.3 NECK: No hypermetabolic cervical lymph nodes are identified.There are no lesions of the pharyngeal mucosal space. Physiologic activity within the muscles of phonation. Incidental CT findings: none CHEST: There are no hypermetabolic mediastinal, hilar or axillary lymph nodes. No suspicious pulmonary activity. Incidental CT findings: Right IJ Port-A-Cath extends to the superior cavoatrial juncture. Coronary artery atherosclerosis. Aside from mild basilar scarring or atelectasis, the lungs are clear. ABDOMEN/PELVIS: There is no hypermetabolic activity within the liver, adrenal glands, spleen or pancreas. There is no hypermetabolic nodal activity. There is some hypermetabolic activity within the left lower quadrant associated with the  sigmoid colon (SUV max 8.9). There are multiple sigmoid colon diverticular changes with wall thickening and surrounding inflammation. Additional scattered bowel activity, within physiologic limits. Right colonic wall thickening noted on previous CTs has markedly improved, and without associated hypermetabolic activity. Incidental CT findings: Overall, the pelvic inflammatory changes have improved. No focal masses or fluid collections are seen. There are nonobstructing right renal calculi. No hydronephrosis. Mild aortic atherosclerosis. SKELETON: Low level hypermetabolic activity throughout the bones, likely treatment related. In the clivus, the activity appears slightly more prominent (SUV max 4.7). No corresponding lytic lesions seen on the CT images. Incidental CT findings: Low-density in the right femoral vein in the thigh, corresponding with previously demonstrated DVT. IMPRESSION: 1. Continued improvement in right colonic wall thickening and surrounding bulky masses consistent with treated lymphoma. No hypermetabolic activity to suggest residual tumor. 2. There is some hypermetabolic activity associated with the sigmoid colon which demonstrates mild wall thickening, surrounding inflammation and underlying diverticulosis, suggesting mild diverticulitis. 3. Suspected treatment related changes throughout the bone marrow. There is mildly increased activity within the clivus without clear corresponding finding on the CT images. Attention on follow-up recommended. 4. Nonobstructing right renal calculi. 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 Electronically Signed   By: Richardean Sale M.D.   On: 04/18/2018 14:22    ASSESSMENT & PLAN:  62 y.o. male with  1. 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 No BM or CNS involvement.   04/18/18 PET/CT revealed Continued improvement in right colonic wall thickening and surrounding bulky masses consistent with treated lymphoma. No hypermetabolic activity to suggest residual tumor. There is some  hypermetabolic activity associated with the sigmoid colon which demonstrates mild wall thickening, surrounding inflammation and underlying diverticulosis, suggesting mild diverticulitis. Suspected treatment related changes throughout the bone marrow. There is mildly increased activity within the clivus without clear corresponding finding on the CT images. Attention on follow-up recommended. Nonobstructing right renal calculi. 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  2. RLE DVT and b/l PE  3. GI bleeding from lymphoma involving bowels-- resolved at this time -- will need to monitor with anticoagulation.  PLAN: -Discussed and reviewed pt labwork today, 05/02/18 -Pt has no prohibitive toxicities from beginning C3D2 [facility] today.  -Intrathecal Methotrexate completed today uneventfully. Asked to lay flat for 6 hours post IT -lovenox on hold for IT MTX (last dose was Saturday evening) - restart lovenox prophylaxis @ 20m [place] from tomorrow AM -Will begin prophylactic dose of Lovenox tomorrow 05/03/18, and return to therapeutic the next day 05/04/18 -Will consider small dose of Lasix for leg swelling if needed. (up about 5 lbs in the last 10 days) -Will continue daily labs  -daily weight requested . Wt Readings from Last 3 Encounters:  05/01/18 234 lb (106.1 kg)  04/28/18 233 lb 14.4 oz (106.1 kg)  04/20/18 229 lb 1.6 oz (103.9 kg)   4. DM2 with some  increased hyperglycemia with steroids Plan -monitor  -continue Oral hypoglycemics and carb consistent diet at this time.  The total time spent in the appt was 25 minutes and more than 50% was on counseling and direct patient cares.    GSullivan LoneMD MS AAHIVMS SRanken Jordan A Pediatric Rehabilitation CenterCNorthbank Surgical CenterHematology/Oncology Physician CFilutowski Cataract And Lasik Institute Pa (Office):       [phone](Work cell):  [phone](Fax):           [phone] 05/02/2018 10:59 AM   I, SBaldwin Jamaica am acting as a scribe for Dr. KIrene Limbo .I have reviewed the above documentation for accuracy and completeness, and I agree with the above. GSullivan LoneMD MS

## 2018-05-02 NOTE — Progress Notes (Signed)
Chemo dosing verified with Drue Dun.

## 2018-05-03 LAB — BASIC METABOLIC PANEL
Anion gap: 8 (ref 5–15)
BUN: 19 mg/dL (ref 8–23)
CALCIUM: 9.6 mg/dL (ref 8.9–10.3)
CO2: 25 mmol/L (ref 22–32)
CREATININE: 0.78 mg/dL (ref 0.61–1.24)
Chloride: 108 mmol/L (ref 98–111)
GFR calc Af Amer: >60 mL/min (ref >60)
GLUCOSE: 248 mg/dL — AB (ref 70–99)
POTASSIUM: 4.4 mmol/L (ref 3.5–5.1)
SODIUM: 141 mmol/L (ref 135–145)

## 2018-05-03 LAB — CBC
HCT: 32 % — ABNORMAL LOW (ref 39.0–52.0)
Hemoglobin: 10.2 g/dL — ABNORMAL LOW (ref 13.0–17.0)
MCH: 29.1 pg (ref 26.0–34.0)
MCHC: 31.9 g/dL (ref 30.0–36.0)
MCV: 91.2 fL (ref 78.0–100.0)
PLATELETS: 448 10*3/uL — AB (ref 150–400)
RBC: 3.51 MIL/uL — ABNORMAL LOW (ref 4.22–5.81)
RDW: 20.7 % — AB (ref 11.5–15.5)
WBC: 10.5 10*3/uL (ref 4.0–10.5)

## 2018-05-03 MED ORDER — SODIUM CHLORIDE 0.9 % IV SOLN
Freq: Once | INTRAVENOUS | Status: AC
  Administered 2018-05-03: 8 mg via INTRAVENOUS
  Filled 2018-05-03: qty 4

## 2018-05-03 MED ORDER — VINCRISTINE SULFATE CHEMO INJECTION 1 MG/ML
Freq: Once | INTRAVENOUS | Status: AC
  Administered 2018-05-03: 13:00:00 via INTRAVENOUS
  Filled 2018-05-03: qty 12

## 2018-05-03 NOTE — Progress Notes (Signed)
HEMATOLOGY/ONCOLOGY INPATIENT PROGRESS NOTE  Date of Service: 05/03/2018  Inpatient Attending: .[name], MD   SUBJECTIVE:   Mark Frey reports that he is doing well overall today and is continuing to tolerate treatment without any complaints.   The pt reports that he did not have any new back pain or headaches after receiving Intrathecal Methotrexate yesterday. He has continued to eat well, has moved his bowels and continues to deny any abdominal pain.   Lab results today (8/21/1) of CBC s is as follows: all values are WNL except for RBC at 3.51, HGB at 10.2, HCT at 32.0, RDW at 20.7, PLT at 448k.  On review of systems, pt reports good energy levels, moving his bowels well, eating well, stable leg swelling, and denies headaches, back pain, abdominal pain, blood in the stools, and any other symptoms.    OBJECTIVE:  NAD  PHYSICAL EXAMINATION: . Vitals:   05/02/18 1409 05/02/18 2215 05/03/18 0552 05/03/18 1359  BP: 133/74 126/76 (!) 137/92 (!) 156/93  Pulse: (!) 110 (!) 106 93 (!) 114  Resp: 18 16 18 16   Temp: 98.1 F (36.7 C) 98.6 F (37 C) 98 F (36.7 C) 97.7 F (36.5 C)  TempSrc: Oral Oral Oral Oral  SpO2: 95% 95% 97% 98%  Weight:      Height:       Filed Weights   05/01/18 1011  Weight: 234 lb (106.1 kg)   .Body mass index is 34.56 kg/m.  GENERAL:alert, in no acute distress and comfortable SKIN: no acute rashes, no significant lesions EYES: conjunctiva are pink and non-injected, sclera anicteric OROPHARYNX: MMM, no exudates, no oropharyngeal erythema or ulceration NECK: supple, no JVD LYMPH:  no palpable lymphadenopathy in the cervical, axillary or inguinal regions LUNGS: clear to auscultation b/l with normal respiratory effort HEART: regular rate & rhythm ABDOMEN:  normoactive bowel sounds , non tender, not distended. No palpable hepatosplenomegaly.  Extremity: 2+ right pedal edema, 1+ left pedal edema  PSYCH: alert & oriented x 3 with  fluent speech NEURO: no focal motor/sensory deficits    MEDICAL HISTORY:  Past Medical History:  Diagnosis Date  . ALLERGIC RHINITIS   . Diabetes mellitus   . Hyperlipidemia     SURGICAL HISTORY: Past Surgical History:  Procedure Laterality Date  . BIOPSY  03/15/2018   Procedure: BIOPSY;  Surgeon: Milus Banister, MD;  Location: [facility];  Service: Endoscopy;;  . CLEFT PALATE REPAIR    . COLONOSCOPY N/A 03/15/2018   Procedure: COLONOSCOPY;  Surgeon: Milus Banister, MD;  Location: [facility];  Service: Endoscopy;  Laterality: N/A;  . deviated septum repair     slight improvement  . ESOPHAGOGASTRODUODENOSCOPY N/A 03/15/2018   Procedure: ESOPHAGOGASTRODUODENOSCOPY (EGD);  Surgeon: Milus Banister, MD;  Location: [name];  Service: Endoscopy;  Laterality: N/A;  . IR IMAGING GUIDED PORT INSERTION  03/17/2018  . TONSILLECTOMY      SOCIAL HISTORY: Social History   Socioeconomic History  . Marital status: Married    Spouse name: Not on file  . Number of children: Not on file  . Years of education: Not on file  . Highest education level: Not on file  Occupational History  . Not on file  Social Needs  . Financial resource strain: Not on file  . Food insecurity:    Worry: Not on file    Inability: Not on file  . Transportation needs:    Medical: Not on file    Non-medical: Not  on file  Tobacco Use  . Smoking status: Never Smoker  . Smokeless tobacco: Never Used  Substance and Sexual Activity  . Alcohol use: Yes    Comment: occasional  . Drug use: No  . Sexual activity: Yes  Lifestyle  . Physical activity:    Days per week: Not on file    Minutes per session: Not on file  . Stress: Not on file  Relationships  . Social connections:    Talks on phone: Not on file    Gets together: Not on file    Attends religious service: Not on file    Active member of club or organization: Not on file    Attends meetings of clubs or organizations: Not on file     Relationship status: Not on file  . Intimate partner violence:    Fear of current or ex partner: Not on file    Emotionally abused: Not on file    Physically abused: Not on file    Forced sexual activity: Not on file  Other Topics Concern  . Not on file  Social History Narrative   Married 1985. No kids. 4 small dogs.       Works in Financial trader, residential      Hobbies: work on cars, Haematologist, exercise as able    FAMILY HISTORY: Family History  Problem Relation Age of Onset  . Lung cancer Mother        smoker  . Brain cancer Mother        metastasis  . AAA (abdominal aortic aneurysm) Father        smoker    ALLERGIES:  is allergic to ciprofloxacin.  MEDICATIONS:  Scheduled Meds: . DOXOrubicin/vinCRIStine/etoposide CHEMO IV infusion for Inpatient CI   Intravenous Once  . enoxaparin (LOVENOX) injection  40 mg Subcutaneous Q24H  . linagliptin  5 mg Oral Daily   And  . metFORMIN  1,000 mg Oral BID WC  . predniSONE  30 mg Oral BID WC  . senna-docusate  2 tablet Oral QHS   Continuous Infusions: . sodium chloride 10 mL/hr at 05/03/18 0300   PRN Meds:.acetaminophen, alteplase, Cold Pack, heparin lock flush, heparin lock flush, Hot Pack, lidocaine-prilocaine, polyethylene glycol, sodium chloride flush, sodium chloride flush  REVIEW OF SYSTEMS:    A 10+ POINT REVIEW OF SYSTEMS WAS OBTAINED including neurology, dermatology, psychiatry, cardiac, respiratory, lymph, extremities, GI, GU, Musculoskeletal, constitutional, breasts, reproductive, HEENT.  All pertinent positives are noted in the HPI.  All others are negative.   LABORATORY DATA:  I have reviewed the data as listed  . CBC Latest Ref Rng & Units 05/03/2018 05/02/2018 05/01/2018  WBC 4.0 - 10.5 K/uL 10.5 13.3(H) 8.0  Hemoglobin 13.0 - 17.0 g/dL 10.2(L) 11.1(L) 10.9(L)  Hematocrit 39.0 - 52.0 % 32.0(L) 34.3(L) 34.3(L)  Platelets 150 - 400 K/uL 448(H) 526(H) 447(H)    . CMP Latest Ref Rng & Units 05/03/2018  05/02/2018 05/01/2018  Glucose 70 - 99 mg/dL 248(H) 262(H) 186(H)  BUN 8 - 23 mg/dL 19 17 14   Creatinine 0.61 - 1.24 mg/dL 0.78 0.85 0.88  Sodium 135 - 145 mmol/L 141 138 141  Potassium 3.5 - 5.1 mmol/L 4.4 4.5 4.3  Chloride 98 - 111 mmol/L 108 105 106  CO2 22 - 32 mmol/L 25 25 27   Calcium 8.9 - 10.3 mg/dL 9.6 9.6 10.0  Total Protein 6.5 - 8.1 g/dL - - 6.9  Total Bilirubin 0.3 - 1.2 mg/dL - - 0.4  Alkaline Phos  38 - 126 U/L - - 57  AST 15 - 41 U/L - - 20  ALT 0 - 44 U/L - - 21     RADIOGRAPHIC STUDIES: I have personally reviewed the radiological images as listed and agreed with the findings in the report. Nm Pet Image Initial (pi) Skull Base To Thigh  Result Date: 04/18/2018 CLINICAL DATA:  Initial treatment strategy for Burkitt's lymphoma. EXAM: NUCLEAR MEDICINE PET SKULL BASE TO THIGH TECHNIQUE: 11.6 mCi F-18 FDG was injected intravenously. Full-ring PET imaging was performed from the skull base to thigh after the radiotracer. CT data was obtained and used for attenuation correction and anatomic localization. Fasting blood glucose: 99 mg/dl COMPARISON:  Chest CT 03/10/2018.  CT abdomen venogram 03/22/2018. FINDINGS: Mediastinal blood pool activity: SUV max 2.3 NECK: No hypermetabolic cervical lymph nodes are identified.There are no lesions of the pharyngeal mucosal space. Physiologic activity within the muscles of phonation. Incidental CT findings: none CHEST: There are no hypermetabolic mediastinal, hilar or axillary lymph nodes. No suspicious pulmonary activity. Incidental CT findings: Right IJ Port-A-Cath extends to the superior cavoatrial juncture. Coronary artery atherosclerosis. Aside from mild basilar scarring or atelectasis, the lungs are clear. ABDOMEN/PELVIS: There is no hypermetabolic activity within the liver, adrenal glands, spleen or pancreas. There is no hypermetabolic nodal activity. There is some hypermetabolic activity within the left lower quadrant associated with the sigmoid  colon (SUV max 8.9). There are multiple sigmoid colon diverticular changes with wall thickening and surrounding inflammation. Additional scattered bowel activity, within physiologic limits. Right colonic wall thickening noted on previous CTs has markedly improved, and without associated hypermetabolic activity. Incidental CT findings: Overall, the pelvic inflammatory changes have improved. No focal masses or fluid collections are seen. There are nonobstructing right renal calculi. No hydronephrosis. Mild aortic atherosclerosis. SKELETON: Low level hypermetabolic activity throughout the bones, likely treatment related. In the clivus, the activity appears slightly more prominent (SUV max 4.7). No corresponding lytic lesions seen on the CT images. Incidental CT findings: Low-density in the right femoral vein in the thigh, corresponding with previously demonstrated DVT. IMPRESSION: 1. Continued improvement in right colonic wall thickening and surrounding bulky masses consistent with treated lymphoma. No hypermetabolic activity to suggest residual tumor. 2. There is some hypermetabolic activity associated with the sigmoid colon which demonstrates mild wall thickening, surrounding inflammation and underlying diverticulosis, suggesting mild diverticulitis. 3. Suspected treatment related changes throughout the bone marrow. There is mildly increased activity within the clivus without clear corresponding finding on the CT images. Attention on follow-up recommended. 4. Nonobstructing right renal calculi. 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 Electronically Signed   By: Richardean Sale M.D.   On: 04/18/2018 14:22   Dg Fluoro Guide Lumbar Puncture  Result Date: 05/02/2018 CLINICAL DATA:  Burkitt lymphoma.  Intrathecal chemotherapy EXAM: FLUOROSCOPICALLY GUIDED LUMBAR PUNCTURE FOR INTRATHECAL CHEMOTHERAPY FLUOROSCOPY TIME:  Fluoroscopy Time:  0 minutes 24 seconds PROCEDURE: Informed consent was obtained from the patient prior  to the procedure, including potential complications of headache, allergy, and pain. With the patient prone, the lower back was prepped with Betadine. 1% Lidocaine was used for local anesthesia. Lumbar puncture was performed at the L2-3 level using a 20 gauge needle with return of clear. 0 ml of CSF were obtained for laboratory studies. The patient tolerated the procedure well and there were no apparent complications. Intrathecal chemotherapy of methotrexate and steroid prepared by pharmacy injected into the subarachnoid space. IMPRESSION: Successful lumbar puncture with intrathecal chemotherapy. Electronically Signed   By: Juanda Crumble  Carlis Abbott M.D.   On: 05/02/2018 12:45    ASSESSMENT & PLAN:  62 y.o. male with  1. 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 No BM or CNS involvement.   04/18/18 PET/CT revealed Continued improvement in right colonic wall thickening and surrounding bulky masses consistent with treated lymphoma. No hypermetabolic activity to suggest residual tumor. There is some hypermetabolic activity associated with the sigmoid colon which demonstrates mild wall thickening, surrounding inflammation and underlying diverticulosis, suggesting mild diverticulitis. Suspected treatment related changes throughout the bone marrow. There is mildly increased activity within the clivus without clear corresponding finding on the CT images. Attention on follow-up recommended.  Nonobstructing right renal calculi. 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  2. RLE DVT and b/l PE  3. GI bleeding from lymphoma involving bowels-- resolved at this time -- will need to monitor with anticoagulation.  PLAN: -Discussed pt labwork today, 05/03/18; blood counts are stable -The pt has no prohibitive toxicities from continuing C3D3 EPOCH at this time.   -lovenox held for IT MTX (last dose was Saturday evening) -Restarted Lovenox prophylaxis  78m Beggs today -Begin therapeutic dose of Lovenox tomorrow 05/04/18 at 1571mSC every evening at 1800 hrs -Continue daily labs -Begin Lasix tomorrow at 4031mo daily -daily weight requested   4. DM2 with some increased hyperglycemia with steroids Plan -monitor  -continue Oral hypoglycemics and carb consistent diet at this time.  The total time spent in the appt was 25 minutes and more than 50% was on counseling and direct patient cares and co-ordination of cares.    GauSullivan Lone MS AAHIVMS SCHExcela Health Westmoreland HospitalHSt [facility] Lcmatology/Oncology Physician ConResearch Medical Center - Brookside CampusOffice):       [phone] cell):  [phone]):           [phone]/21/2019 5:31 PM   I, SchBaldwin Jamaicam acting as a scribe for Dr. KalIrene LimboI have reviewed the above documentation for accuracy and completeness, and I agree with the above. GauSullivan Lone MS

## 2018-05-04 ENCOUNTER — Telehealth: Payer: Self-pay | Admitting: Hematology

## 2018-05-04 DIAGNOSIS — R609 Edema, unspecified: Secondary | ICD-10-CM

## 2018-05-04 LAB — CBC
HEMATOCRIT: 30.6 % — AB (ref 39.0–52.0)
HEMOGLOBIN: 9.8 g/dL — AB (ref 13.0–17.0)
MCH: 29.1 pg (ref 26.0–34.0)
MCHC: 32 g/dL (ref 30.0–36.0)
MCV: 90.8 fL (ref 78.0–100.0)
Platelets: 428 10*3/uL — ABNORMAL HIGH (ref 150–400)
RBC: 3.37 MIL/uL — AB (ref 4.22–5.81)
RDW: 20.4 % — AB (ref 11.5–15.5)
WBC: 7 10*3/uL (ref 4.0–10.5)

## 2018-05-04 LAB — BASIC METABOLIC PANEL
ANION GAP: 6 (ref 5–15)
BUN: 20 mg/dL (ref 8–23)
CALCIUM: 9.5 mg/dL (ref 8.9–10.3)
CO2: 26 mmol/L (ref 22–32)
Chloride: 107 mmol/L (ref 98–111)
Creatinine, Ser: 0.8 mg/dL (ref 0.61–1.24)
GFR calc non Af Amer: >60 mL/min (ref >60)
Glucose, Bld: 225 mg/dL — ABNORMAL HIGH (ref 70–99)
POTASSIUM: 4.1 mmol/L (ref 3.5–5.1)
Sodium: 139 mmol/L (ref 135–145)

## 2018-05-04 MED ORDER — ONDANSETRON HCL 40 MG/20ML IJ SOLN
Freq: Once | INTRAMUSCULAR | Status: AC
  Administered 2018-05-04: 8 mg via INTRAVENOUS
  Filled 2018-05-04: qty 4

## 2018-05-04 MED ORDER — ENOXAPARIN SODIUM 150 MG/ML ~~LOC~~ SOLN
150.0000 mg | SUBCUTANEOUS | Status: DC
  Administered 2018-05-04 – 2018-05-05 (×2): 150 mg via SUBCUTANEOUS
  Filled 2018-05-04 (×2): qty 1

## 2018-05-04 MED ORDER — VINCRISTINE SULFATE CHEMO INJECTION 1 MG/ML
Freq: Once | INTRAVENOUS | Status: AC
  Administered 2018-05-04: 22:00:00 via INTRAVENOUS
  Filled 2018-05-04: qty 12

## 2018-05-04 MED ORDER — FUROSEMIDE 40 MG PO TABS
40.0000 mg | ORAL_TABLET | Freq: Every day | ORAL | Status: DC
  Administered 2018-05-04 – 2018-05-05 (×2): 40 mg via ORAL
  Filled 2018-05-04 (×2): qty 1

## 2018-05-04 NOTE — Progress Notes (Signed)
Inpatient Diabetes Program Recommendations  AACE/ADA: New Consensus Statement on Inpatient Glycemic Control (2015)  Target Ranges:  Prepandial:   less than 140 mg/dL      Peak postprandial:   less than 180 mg/dL (1-2 hours)      Critically ill patients:  140 - 180 mg/dL   Lab Results  Component Value Date   GLUCAP 99 04/18/2018   HGBA1C 5.4 03/10/2018    Review of Glycemic Control   Diabetes history: DM2 Outpatient Diabetes medications: Janumet 50-1000 mg BID Current orders for Inpatient glycemic control: Tradjenta 5 mg QAM, Metformin 1000 mg BID  Inpatient Diabetes Program Recommendations: Correction (SSI): While inpatient, please consider ordering Novolog 0-9 units TID wtih meals and Novolog 0-5 units QHS.  Thank you. Lorenda Peck, RD, LDN, CDE Inpatient Diabetes Coordinator [phone]

## 2018-05-04 NOTE — Progress Notes (Signed)
Chemotherapy dosage and calculations checked and reviewed with Drue Dun RN.

## 2018-05-04 NOTE — Telephone Encounter (Signed)
Spoke to patient regarding upcoming aug appts per 8/22 sch message.

## 2018-05-04 NOTE — Progress Notes (Signed)
Scheduled chemotherapy infusion began at 2147. Dosage and calculations verified with Clotilde Dieter RN. Hortencia Conradi RN

## 2018-05-04 NOTE — Progress Notes (Signed)
HEMATOLOGY/ONCOLOGY INPATIENT PROGRESS NOTE  Date of Service: 05/04/2018  Inpatient Attending: .[name], MD   SUBJECTIVE:   Mark Frey is accompanied today by his wife at bedside. The pt reports that he is doing well overall.   The pt reports that he has no new concerns and notes that he continues to tolerate C3D4 of EPOCH very well. The pt notes that he has moved his bowels twice this morning without pain or blood.   Lab results today (05/04/18) of CBC and BMP is as follows: all values are WNL except for RBC at 3.37, HGB at 9.8, HCT at 30.6, RDW at 20.4, PLT at 428k, Glucose at 225  On review of systems, pt reports stable leg swelling, moving his bowels well, good energy levels, and denies abdominal pains, blood in the stools, and any other symptoms.    OBJECTIVE:  NAD  PHYSICAL EXAMINATION: . Vitals:   05/03/18 1359 05/03/18 2325 05/04/18 0435 05/04/18 0500  BP: (!) 156/93 132/85 (!) 159/102   Pulse: (!) 114 83 87   Resp: 16 16 17    Temp: 97.7 F (36.5 C) 97.6 F (36.4 C) 97.8 F (36.6 C)   TempSrc: Oral Oral Oral   SpO2: 98% 97% 100%   Weight:    233 lb 14.4 oz (106.1 kg)  Height:       Filed Weights   05/01/18 1011 05/04/18 0500  Weight: 234 lb (106.1 kg) 233 lb 14.4 oz (106.1 kg)   .Body mass index is 34.54 kg/m.  GENERAL:alert, in no acute distress and comfortable SKIN: no acute rashes, no significant lesions EYES: conjunctiva are pink and non-injected, sclera anicteric OROPHARYNX: MMM, no exudates, no oropharyngeal erythema or ulceration NECK: supple, no JVD LYMPH:  no palpable lymphadenopathy in the cervical, axillary or inguinal regions LUNGS: clear to auscultation b/l with normal respiratory effort HEART: regular rate & rhythm ABDOMEN:  normoactive bowel sounds , non tender, not distended. No palpable hepatosplenomegaly.  Extremity: 2+ right pedal edema, 1+ left pedal edema  PSYCH: alert & oriented x 3 with fluent speech NEURO: no  focal motor/sensory deficits    MEDICAL HISTORY:  Past Medical History:  Diagnosis Date  . ALLERGIC RHINITIS   . Diabetes mellitus   . Hyperlipidemia     SURGICAL HISTORY: Past Surgical History:  Procedure Laterality Date  . BIOPSY  03/15/2018   Procedure: BIOPSY;  Surgeon: Milus Banister, MD;  Location: [facility];  Service: Endoscopy;;  . CLEFT PALATE REPAIR    . COLONOSCOPY N/A 03/15/2018   Procedure: COLONOSCOPY;  Surgeon: Milus Banister, MD;  Location: [facility];  Service: Endoscopy;  Laterality: N/A;  . deviated septum repair     slight improvement  . ESOPHAGOGASTRODUODENOSCOPY N/A 03/15/2018   Procedure: ESOPHAGOGASTRODUODENOSCOPY (EGD);  Surgeon: Milus Banister, MD;  Location: [name];  Service: Endoscopy;  Laterality: N/A;  . IR IMAGING GUIDED PORT INSERTION  03/17/2018  . TONSILLECTOMY      SOCIAL HISTORY: Social History   Socioeconomic History  . Marital status: Married    Spouse name: Not on file  . Number of children: Not on file  . Years of education: Not on file  . Highest education level: Not on file  Occupational History  . Not on file  Social Needs  . Financial resource strain: Not on file  . Food insecurity:    Worry: Not on file    Inability: Not on file  . Transportation needs:    Medical:  Not on file    Non-medical: Not on file  Tobacco Use  . Smoking status: Never Smoker  . Smokeless tobacco: Never Used  Substance and Sexual Activity  . Alcohol use: Yes    Comment: occasional  . Drug use: No  . Sexual activity: Yes  Lifestyle  . Physical activity:    Days per week: Not on file    Minutes per session: Not on file  . Stress: Not on file  Relationships  . Social connections:    Talks on phone: Not on file    Gets together: Not on file    Attends religious service: Not on file    Active member of club or organization: Not on file    Attends meetings of clubs or organizations: Not on file    Relationship status: Not on file    . Intimate partner violence:    Fear of current or ex partner: Not on file    Emotionally abused: Not on file    Physically abused: Not on file    Forced sexual activity: Not on file  Other Topics Concern  . Not on file  Social History Narrative   Married 1985. No kids. 4 small dogs.       Works in Financial trader, residential      Hobbies: work on cars, Haematologist, exercise as able    FAMILY HISTORY: Family History  Problem Relation Age of Onset  . Lung cancer Mother        smoker  . Brain cancer Mother        metastasis  . AAA (abdominal aortic aneurysm) Father        smoker    ALLERGIES:  is allergic to ciprofloxacin.  MEDICATIONS:  Scheduled Meds: . DOXOrubicin/vinCRIStine/etoposide CHEMO IV infusion for Inpatient CI   Intravenous Once  . enoxaparin (LOVENOX) injection  150 mg Subcutaneous Q24H  . furosemide  40 mg Oral Daily  . linagliptin  5 mg Oral Daily   And  . metFORMIN  1,000 mg Oral BID WC  . predniSONE  30 mg Oral BID WC  . senna-docusate  2 tablet Oral QHS   Continuous Infusions: . sodium chloride 10 mL/hr at 05/04/18 0400  . ondansetron (ZOFRAN) with dexamethasone (DECADRON) IV     PRN Meds:.acetaminophen, alteplase, Cold Pack, heparin lock flush, heparin lock flush, Hot Pack, lidocaine-prilocaine, polyethylene glycol, sodium chloride flush, sodium chloride flush  REVIEW OF SYSTEMS:    A 10+ POINT REVIEW OF SYSTEMS WAS OBTAINED including neurology, dermatology, psychiatry, cardiac, respiratory, lymph, extremities, GI, GU, Musculoskeletal, constitutional, breasts, reproductive, HEENT.  All pertinent positives are noted in the HPI.  All others are negative.   LABORATORY DATA:  I have reviewed the data as listed  . CBC Latest Ref Rng & Units 05/04/2018 05/03/2018 05/02/2018  WBC 4.0 - 10.5 K/uL 7.0 10.5 13.3(H)  Hemoglobin 13.0 - 17.0 g/dL 9.8(L) 10.2(L) 11.1(L)  Hematocrit 39.0 - 52.0 % 30.6(L) 32.0(L) 34.3(L)  Platelets 150 - 400 K/uL 428(H)  448(H) 526(H)    . CMP Latest Ref Rng & Units 05/04/2018 05/03/2018 05/02/2018  Glucose 70 - 99 mg/dL 225(H) 248(H) 262(H)  BUN 8 - 23 mg/dL 20 19 17   Creatinine 0.61 - 1.24 mg/dL 0.80 0.78 0.85  Sodium 135 - 145 mmol/L 139 141 138  Potassium 3.5 - 5.1 mmol/L 4.1 4.4 4.5  Chloride 98 - 111 mmol/L 107 108 105  CO2 22 - 32 mmol/L 26 25 25   Calcium 8.9 - 10.3  mg/dL 9.5 9.6 9.6  Total Protein 6.5 - 8.1 g/dL - - -  Total Bilirubin 0.3 - 1.2 mg/dL - - -  Alkaline Phos 38 - 126 U/L - - -  AST 15 - 41 U/L - - -  ALT 0 - 44 U/L - - -     RADIOGRAPHIC STUDIES: I have personally reviewed the radiological images as listed and agreed with the findings in the report. Nm Pet Image Initial (pi) Skull Base To Thigh  Result Date: 04/18/2018 CLINICAL DATA:  Initial treatment strategy for Burkitt's lymphoma. EXAM: NUCLEAR MEDICINE PET SKULL BASE TO THIGH TECHNIQUE: 11.6 mCi F-18 FDG was injected intravenously. Full-ring PET imaging was performed from the skull base to thigh after the radiotracer. CT data was obtained and used for attenuation correction and anatomic localization. Fasting blood glucose: 99 mg/dl COMPARISON:  Chest CT 03/10/2018.  CT abdomen venogram 03/22/2018. FINDINGS: Mediastinal blood pool activity: SUV max 2.3 NECK: No hypermetabolic cervical lymph nodes are identified.There are no lesions of the pharyngeal mucosal space. Physiologic activity within the muscles of phonation. Incidental CT findings: none CHEST: There are no hypermetabolic mediastinal, hilar or axillary lymph nodes. No suspicious pulmonary activity. Incidental CT findings: Right IJ Port-A-Cath extends to the superior cavoatrial juncture. Coronary artery atherosclerosis. Aside from mild basilar scarring or atelectasis, the lungs are clear. ABDOMEN/PELVIS: There is no hypermetabolic activity within the liver, adrenal glands, spleen or pancreas. There is no hypermetabolic nodal activity. There is some hypermetabolic activity within  the left lower quadrant associated with the sigmoid colon (SUV max 8.9). There are multiple sigmoid colon diverticular changes with wall thickening and surrounding inflammation. Additional scattered bowel activity, within physiologic limits. Right colonic wall thickening noted on previous CTs has markedly improved, and without associated hypermetabolic activity. Incidental CT findings: Overall, the pelvic inflammatory changes have improved. No focal masses or fluid collections are seen. There are nonobstructing right renal calculi. No hydronephrosis. Mild aortic atherosclerosis. SKELETON: Low level hypermetabolic activity throughout the bones, likely treatment related. In the clivus, the activity appears slightly more prominent (SUV max 4.7). No corresponding lytic lesions seen on the CT images. Incidental CT findings: Low-density in the right femoral vein in the thigh, corresponding with previously demonstrated DVT. IMPRESSION: 1. Continued improvement in right colonic wall thickening and surrounding bulky masses consistent with treated lymphoma. No hypermetabolic activity to suggest residual tumor. 2. There is some hypermetabolic activity associated with the sigmoid colon which demonstrates mild wall thickening, surrounding inflammation and underlying diverticulosis, suggesting mild diverticulitis. 3. Suspected treatment related changes throughout the bone marrow. There is mildly increased activity within the clivus without clear corresponding finding on the CT images. Attention on follow-up recommended. 4. Nonobstructing right renal calculi. 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 Electronically Signed   By: Richardean Sale M.D.   On: 04/18/2018 14:22   Dg Fluoro Guide Lumbar Puncture  Result Date: 05/02/2018 CLINICAL DATA:  Burkitt lymphoma.  Intrathecal chemotherapy EXAM: FLUOROSCOPICALLY GUIDED LUMBAR PUNCTURE FOR INTRATHECAL CHEMOTHERAPY FLUOROSCOPY TIME:  Fluoroscopy Time:  0 minutes 24 seconds PROCEDURE:  Informed consent was obtained from the patient prior to the procedure, including potential complications of headache, allergy, and pain. With the patient prone, the lower back was prepped with Betadine. 1% Lidocaine was used for local anesthesia. Lumbar puncture was performed at the L2-3 level using a 20 gauge needle with return of clear. 0 ml of CSF were obtained for laboratory studies. The patient tolerated the procedure well and there were no apparent complications.  Intrathecal chemotherapy of methotrexate and steroid prepared by pharmacy injected into the subarachnoid space. IMPRESSION: Successful lumbar puncture with intrathecal chemotherapy. Electronically Signed   By: Franchot Gallo M.D.   On: 05/02/2018 12:45    ASSESSMENT & PLAN:  62 y.o. male with  1. 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 No BM or CNS involvement.   04/18/18 PET/CT revealed Continued improvement in right colonic wall thickening and surrounding bulky masses consistent with treated lymphoma. No hypermetabolic activity to suggest residual tumor. There is some hypermetabolic activity associated with the sigmoid colon which demonstrates mild wall thickening, surrounding inflammation and underlying diverticulosis, suggesting mild diverticulitis. Suspected treatment related changes throughout the bone marrow. There is mildly increased activity within the clivus without clear corresponding finding on the CT  images. Attention on follow-up recommended. Nonobstructing right renal calculi. 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  2. RLE DVT and b/l PE  3. GI bleeding from lymphoma involving bowels-- resolved at this time -- will need to monitor with anticoagulation.  PLAN: -Discussed pt labwork today, 05/04/18; blood counts and chemistries are stable -The pt has no prohibitive toxicities from continuing C3D4 of EPOCH-R at this time.   -Continue with 83m Lasix today  -Continue labs every day -Begin therapeutic dose of Lovenox today 05/04/18 at 1565mSC every evening at 1800 hrs -daily weight requested  -Will order Dexamethasone for 2 days after discharge -Rituxan and Neulasta as outpatient on 05/08/2018  4. DM2 with some increased hyperglycemia with steroids Plan -monitor  -continue Oral hypoglycemics and carb consistent diet at this time.  The total time spent in the appt was 25 minutes and more than 50% was on counseling and direct patient cares.     GaSullivan LoneD MS AAHIVMS SCRaleigh [facility] Center CaryTEye [facility] Surgery Center LLCematology/Oncology Physician [facility](Office):       [phone] cell):  [phone]):           [phone]/22/2019 11:50 AM   I, ScBaldwin Jamaicaam acting as a scribe for Dr. KaIrene Limbo.I have reviewed the above documentation for accuracy and completeness, and I agree with the above. GaSullivan LoneD MS

## 2018-05-05 DIAGNOSIS — E119 Type 2 diabetes mellitus without complications: Secondary | ICD-10-CM

## 2018-05-05 LAB — CBC
HCT: 33.8 % — ABNORMAL LOW (ref 39.0–52.0)
HEMOGLOBIN: 11 g/dL — AB (ref 13.0–17.0)
MCH: 29.3 pg (ref 26.0–34.0)
MCHC: 32.5 g/dL (ref 30.0–36.0)
MCV: 89.9 fL (ref 78.0–100.0)
Platelets: 507 10*3/uL — ABNORMAL HIGH (ref 150–400)
RBC: 3.76 MIL/uL — ABNORMAL LOW (ref 4.22–5.81)
RDW: 19.8 % — AB (ref 11.5–15.5)
WBC: 4.5 10*3/uL (ref 4.0–10.5)

## 2018-05-05 LAB — BASIC METABOLIC PANEL
Anion gap: 8 (ref 5–15)
BUN: 22 mg/dL (ref 8–23)
CALCIUM: 9.7 mg/dL (ref 8.9–10.3)
CO2: 28 mmol/L (ref 22–32)
CREATININE: 0.77 mg/dL (ref 0.61–1.24)
Chloride: 104 mmol/L (ref 98–111)
GFR calc non Af Amer: >60 mL/min (ref >60)
Glucose, Bld: 275 mg/dL — ABNORMAL HIGH (ref 70–99)
Potassium: 4.1 mmol/L (ref 3.5–5.1)
SODIUM: 140 mmol/L (ref 135–145)

## 2018-05-05 MED ORDER — SODIUM CHLORIDE 0.9 % IV SOLN
Freq: Once | INTRAVENOUS | Status: AC
  Administered 2018-05-05: 36 mg via INTRAVENOUS
  Filled 2018-05-05: qty 8

## 2018-05-05 MED ORDER — ONDANSETRON HCL 8 MG PO TABS: 8.0000 mg | ORAL_TABLET | Freq: Three times a day (TID) | ORAL | 0 refills | Status: DC | PRN

## 2018-05-05 MED ORDER — CYCLOPHOSPHAMIDE CHEMO INJECTION 1 GM
750.0000 mg/m2 | Freq: Once | INTRAMUSCULAR | Status: AC
  Administered 2018-05-05: 1800 mg via INTRAVENOUS
  Filled 2018-05-05: qty 90

## 2018-05-05 MED ORDER — DEXAMETHASONE 4 MG PO TABS: ORAL_TABLET | ORAL | 1 refills | Status: DC

## 2018-05-05 MED ORDER — FUROSEMIDE 20 MG PO TABS: 20.0000 mg | ORAL_TABLET | Freq: Every day | ORAL | 0 refills | Status: DC

## 2018-05-05 NOTE — Discharge Summary (Signed)
[place]  Telephone:(336) [phone] Fax:(336) [phone]    Physician Discharge Summary     Patient ID: Mark Frey MRN: [id number] DOB/AGE: 1956-02-06 62 y.o.  Admit date: 05/01/2018 Discharge date: 05/05/2018  Primary Care Physician:  [name], MD   Discharge Diagnoses:    Present on Admission: . Burkitt lymphoma of lymph nodes of multiple regions [facility])   Discharge Medications:  Allergies as of 05/05/2018      Reactions   Ciprofloxacin Other (See Comments)   Leg tingling      Medication List    TAKE these medications   acetaminophen 325 MG tablet Commonly known as:  TYLENOL Take 2 tablets (650 mg total) by mouth every 6 (six) hours as needed for mild pain (or Fever >/= 101).   blood glucose meter kit and supplies Dispense based on patient and insurance preference. Use daily as directed. (E11.9).   Choline Fenofibrate 135 MG capsule TAKE 1 CAPSULE DAILY What changed:    how much to take  how to take this  when to take this   dexamethasone 4 MG tablet Commonly known as:  DECADRON 81m (2 tabs) oral with breakfast for 2 days after each cycle of chemotherapy   enoxaparin 150 MG/ML injection Commonly known as:  LOVENOX Inject 1 mL (150 mg total) into the skin daily.   furosemide 20 MG tablet Commonly known as:  LASIX Take 1 tablet (20 mg total) by mouth daily. Hold after 10-15 days if leg swelling resolved or if >10lbs weight loss. Start taking on:  05/06/2018   glucose blood test strip Use to check blood sugar daily   JANUMET 50-1000 MG tablet Generic drug:  sitaGLIPtin-metformin TAKE 1 TABLET TWICE DAILY  WITH MEALS   lidocaine-prilocaine cream Commonly known as:  EMLA Apply 1 application topically as needed. What changed:    when to take this  reasons to take this   ondansetron 8 MG tablet Commonly known as:  ZOFRAN Take 1 tablet (8 mg total) by mouth every 8 (eight) hours as needed for nausea or  vomiting.   senna-docusate 8.6-50 MG tablet Commonly known as:  Senokot-S Take 2 tablets by mouth at bedtime.        Disposition and Follow-up:   Significant Diagnostic Studies:  Nm Pet Image Initial (pi) Skull Base To Thigh  Result Date: 04/18/2018 CLINICAL DATA:  Initial treatment strategy for Burkitt's lymphoma. EXAM: NUCLEAR MEDICINE PET SKULL BASE TO THIGH TECHNIQUE: 11.6 mCi F-18 FDG was injected intravenously. Full-ring PET imaging was performed from the skull base to thigh after the radiotracer. CT data was obtained and used for attenuation correction and anatomic localization. Fasting blood glucose: 99 mg/dl COMPARISON:  Chest CT 03/10/2018.  CT abdomen venogram 03/22/2018. FINDINGS: Mediastinal blood pool activity: SUV max 2.3 NECK: No hypermetabolic cervical lymph nodes are identified.There are no lesions of the pharyngeal mucosal space. Physiologic activity within the muscles of phonation. Incidental CT findings: none CHEST: There are no hypermetabolic mediastinal, hilar or axillary lymph nodes. No suspicious pulmonary activity. Incidental CT findings: Right IJ Port-A-Cath extends to the superior cavoatrial juncture. Coronary artery atherosclerosis. Aside from mild basilar scarring or atelectasis, the lungs are clear. ABDOMEN/PELVIS: There is no hypermetabolic activity within the liver, adrenal glands, spleen or pancreas. There is no hypermetabolic nodal activity. There is some hypermetabolic activity within the left lower quadrant associated with the sigmoid colon (SUV max 8.9). There are multiple sigmoid colon diverticular changes with wall thickening and  surrounding inflammation. Additional scattered bowel activity, within physiologic limits. Right colonic wall thickening noted on previous CTs has markedly improved, and without associated hypermetabolic activity. Incidental CT findings: Overall, the pelvic inflammatory changes have improved. No focal masses or fluid collections are seen.  There are nonobstructing right renal calculi. No hydronephrosis. Mild aortic atherosclerosis. SKELETON: Low level hypermetabolic activity throughout the bones, likely treatment related. In the clivus, the activity appears slightly more prominent (SUV max 4.7). No corresponding lytic lesions seen on the CT images. Incidental CT findings: Low-density in the right femoral vein in the thigh, corresponding with previously demonstrated DVT. IMPRESSION: 1. Continued improvement in right colonic wall thickening and surrounding bulky masses consistent with treated lymphoma. No hypermetabolic activity to suggest residual tumor. 2. There is some hypermetabolic activity associated with the sigmoid colon which demonstrates mild wall thickening, surrounding inflammation and underlying diverticulosis, suggesting mild diverticulitis. 3. Suspected treatment related changes throughout the bone marrow. There is mildly increased activity within the clivus without clear corresponding finding on the CT images. Attention on follow-up recommended. 4. Nonobstructing right renal calculi. 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 Electronically Signed   By: Richardean Sale M.D.   On: 04/18/2018 14:22   Dg Fluoro Guide Lumbar Puncture  Result Date: 05/02/2018 CLINICAL DATA:  Burkitt lymphoma.  Intrathecal chemotherapy EXAM: FLUOROSCOPICALLY GUIDED LUMBAR PUNCTURE FOR INTRATHECAL CHEMOTHERAPY FLUOROSCOPY TIME:  Fluoroscopy Time:  0 minutes 24 seconds PROCEDURE: Informed consent was obtained from the patient prior to the procedure, including potential complications of headache, allergy, and pain. With the patient prone, the lower back was prepped with Betadine. 1% Lidocaine was used for local anesthesia. Lumbar puncture was performed at the L2-3 level using a 20 gauge needle with return of clear. 0 ml of CSF were obtained for laboratory studies. The patient tolerated the procedure well and there were no apparent complications. Intrathecal  chemotherapy of methotrexate and steroid prepared by pharmacy injected into the subarachnoid space. IMPRESSION: Successful lumbar puncture with intrathecal chemotherapy. Electronically Signed   By: Franchot Gallo M.D.   On: 05/02/2018 12:45    Discharge Laboratory Values: . CBC Latest Ref Rng & Units 05/05/2018 05/04/2018 05/03/2018  WBC 4.0 - 10.5 K/uL 4.5 7.0 10.5  Hemoglobin 13.0 - 17.0 g/dL 11.0(L) 9.8(L) 10.2(L)  Hematocrit 39.0 - 52.0 % 33.8(L) 30.6(L) 32.0(L)  Platelets 150 - 400 K/uL 507(H) 428(H) 448(H)   . CMP Latest Ref Rng & Units 05/05/2018 05/04/2018 05/03/2018  Glucose 70 - 99 mg/dL 275(H) 225(H) 248(H)  BUN 8 - 23 mg/dL _0 Creatinine 0.61 - 1.24 mg/dL 0.77 0.80 0.78  Sodium 135 - 145 mmol/L 140 139 141  Potassium 3.5 - 5.1 mmol/L 4.1 4.1 4.4  Chloride 98 - 111 mmol/L 104 107 108  CO2 22 - 32 mmol/L _1 Calcium 8.9 - 10.3 mg/dL 9.7 9.5 9.6  Total Protein 6.5 - 8.1 g/dL - - -  Total Bilirubin 0.3 - 1.2 mg/dL - - -  Alkaline Phos 38 - 126 U/L - - -  AST 15 - 41 U/L - - -  ALT 0 - 44 U/L - - -     Brief H and P: For complete details please refer to admission H and P, but in brief,   Mark Frey is a wonderful 62 y.o. 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 He is accompanied today by his wife at bedside. The pt reports that he is doing  very well overall.   The pt notes that he has no new concerns and is ready to begin C3 of EPOCH-R. His last dose of Lovenox was Saturday night 04/29/18, in anticipation of intrathecal methotrexate tomorrow 05/02/18. He notes that his right leg swelling is stable, as are his energy levels. He continues to deny abdominal pains, blood in the stools and other bowel abnormalities.  He took his last dose of Lovenox Saturday night.  Issues during hospitalization  1. 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 No BM or CNS involvement.   04/18/18 PET/CT revealed Continued improvement in right colonic wall thickening and surrounding bulky masses consistent with treated lymphoma. No hypermetabolic activity to suggest residual tumor. There is some hypermetabolic activity associated with the sigmoid colon which demonstrates mild wall thickening, surrounding inflammation and underlying diverticulosis, suggesting mild diverticulitis. Suspected treatment related changes throughout the bone marrow. There is mildly increased activity within the clivus without clear corresponding finding on the CT images. Attention on follow-up recommended. Nonobstructing right renal calculi. 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  2. RLE DVT and b/l PE  3. GI bleeding from lymphoma involving bowels-- resolved at this time -- will need to monitor with anticoagulation.  PLAN: -lovenox held for 48 hours prior to  IT MTX. Patient tolerated his EPOCH and IT MTX without any acute toxicities. -Restarted Lovenox prophylaxis Wednesday 05/03/18 -Began therapeutic dose of Lovenox Thursday 05/04/18 at 110m [place] every evening at 1800 hrs and will continue on discharge -Will order Dexamethasone for use after discharge for 2 days -labs stable on discharge -Continue with 434mLasix for 10-15 days after discharge for pedal edema -Rituxan infusion and neulasta on 05/08/18  -Clinic f/u with Dr KaIrene Limbon 7-10 days post discharge  4. DM2 with some increased hyperglycemia with steroids Plan -monitor  -continue  Oral hypoglycemics and carb consistent diet at this time.   Physical Exam at Discharge: BP 139/89 (BP Location: Right Arm)   Pulse (!) 104   Temp 97.8 F (36.6 C) (Oral)   Resp 18   Ht 5' 9" (1.753 m)   Wt 231 lb 1.6 oz (104.8 kg)   SpO2 99%   BMI 34.13 kg/m  . GENERAL:alert, in no acute distress and comfortable SKIN: no acute rashes, no significant lesions EYES: conjunctiva are pink and non-injected, sclera anicteric OROPHARYNX: MMM, no exudates, no oropharyngeal erythema or ulceration NECK: supple, no JVD LYMPH:  no palpable lymphadenopathy in the cervical, axillary or inguinal regions LUNGS: clear to auscultation b/l with normal respiratory effort HEART: regular rate & rhythm ABDOMEN:  normoactive bowel sounds , non tender, not distended. Extremity: pedal edema 2+ rt leg and 1+ left leg PSYCH: alert & oriented x 3 with fluent speech NEURO: no focal motor/sensory deficits    Hospital Course:  Active Problems:   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HCC)   Acute deep vein thrombosis (DVT) of distal vein of lower extremity (HCC)   Edema   Diet:  Diabetic Consistent Carb  Activity:  As instructed.  Infection protection  Condition at Discharge:   Stable  Signed: Dr. Sullivan Lone MD [place] [phone]  05/05/2018, 4:45 PM   Time spent discharging patient >4mns

## 2018-05-05 NOTE — Progress Notes (Signed)
Chemo dosages and dilutions verified by 2 chemo RNs

## 2018-05-05 NOTE — Progress Notes (Signed)
Pt discharged home in stable condition. Discharge instructions given. Script sent to pharmacy of choice. No immediate questions or concerns at this time.

## 2018-05-08 ENCOUNTER — Inpatient Hospital Stay: Payer: BLUE CROSS/BLUE SHIELD

## 2018-05-08 VITALS — BP 121/78 | HR 112 | Temp 98.4°F | Resp 17

## 2018-05-08 DIAGNOSIS — C8373 Burkitt lymphoma, intra-abdominal lymph nodes: Secondary | ICD-10-CM | POA: Diagnosis not present

## 2018-05-08 DIAGNOSIS — C8510 Unspecified B-cell lymphoma, unspecified site: Secondary | ICD-10-CM

## 2018-05-08 DIAGNOSIS — K922 Gastrointestinal hemorrhage, unspecified: Secondary | ICD-10-CM | POA: Diagnosis not present

## 2018-05-08 DIAGNOSIS — Z95828 Presence of other vascular implants and grafts: Secondary | ICD-10-CM

## 2018-05-08 DIAGNOSIS — Z79899 Other long term (current) drug therapy: Secondary | ICD-10-CM | POA: Diagnosis not present

## 2018-05-08 DIAGNOSIS — I82491 Acute embolism and thrombosis of other specified deep vein of right lower extremity: Secondary | ICD-10-CM | POA: Diagnosis not present

## 2018-05-08 DIAGNOSIS — I2699 Other pulmonary embolism without acute cor pulmonale: Secondary | ICD-10-CM | POA: Diagnosis not present

## 2018-05-08 DIAGNOSIS — Z7901 Long term (current) use of anticoagulants: Secondary | ICD-10-CM | POA: Diagnosis not present

## 2018-05-08 DIAGNOSIS — C8378 Burkitt lymphoma, lymph nodes of multiple sites: Secondary | ICD-10-CM

## 2018-05-08 DIAGNOSIS — D500 Iron deficiency anemia secondary to blood loss (chronic): Secondary | ICD-10-CM | POA: Diagnosis not present

## 2018-05-08 DIAGNOSIS — Z7189 Other specified counseling: Secondary | ICD-10-CM

## 2018-05-08 MED ORDER — ACETAMINOPHEN 325 MG PO TABS
ORAL_TABLET | ORAL | Status: AC
  Filled 2018-05-08: qty 2

## 2018-05-08 MED ORDER — ACETAMINOPHEN 325 MG PO TABS
650.0000 mg | ORAL_TABLET | Freq: Once | ORAL | Status: AC
  Administered 2018-05-08: 650 mg via ORAL

## 2018-05-08 MED ORDER — SODIUM CHLORIDE 0.9 % IV SOLN
Freq: Once | INTRAVENOUS | Status: AC
  Administered 2018-05-08: 09:00:00 via INTRAVENOUS
  Filled 2018-05-08: qty 250

## 2018-05-08 MED ORDER — SODIUM CHLORIDE 0.9% FLUSH
10.0000 mL | INTRAVENOUS | Status: DC | PRN
  Administered 2018-05-08: 10 mL
  Filled 2018-05-08: qty 10

## 2018-05-08 MED ORDER — HEPARIN SOD (PORK) LOCK FLUSH 100 UNIT/ML IV SOLN
500.0000 [IU] | Freq: Once | INTRAVENOUS | Status: AC | PRN
  Administered 2018-05-08: 500 [IU]
  Filled 2018-05-08: qty 5

## 2018-05-08 MED ORDER — SODIUM CHLORIDE 0.9 % IV SOLN
375.0000 mg/m2 | Freq: Once | INTRAVENOUS | Status: AC
  Administered 2018-05-08: 900 mg via INTRAVENOUS
  Filled 2018-05-08: qty 50

## 2018-05-08 MED ORDER — DIPHENHYDRAMINE HCL 25 MG PO CAPS
50.0000 mg | ORAL_CAPSULE | Freq: Once | ORAL | Status: AC
  Administered 2018-05-08: 50 mg via ORAL

## 2018-05-08 MED ORDER — DIPHENHYDRAMINE HCL 25 MG PO CAPS
ORAL_CAPSULE | ORAL | Status: AC
  Filled 2018-05-08: qty 1

## 2018-05-08 MED ORDER — PEGFILGRASTIM INJECTION 6 MG/0.6ML ~~LOC~~
PREFILLED_SYRINGE | SUBCUTANEOUS | Status: AC
  Filled 2018-05-08: qty 0.6

## 2018-05-08 MED ORDER — PEGFILGRASTIM INJECTION 6 MG/0.6ML ~~LOC~~
6.0000 mg | PREFILLED_SYRINGE | Freq: Once | SUBCUTANEOUS | Status: AC
  Administered 2018-05-08: 6 mg via SUBCUTANEOUS

## 2018-05-08 MED ORDER — METHYLPREDNISOLONE SODIUM SUCC 125 MG IJ SOLR
INTRAMUSCULAR | Status: AC
  Filled 2018-05-08: qty 2

## 2018-05-08 MED ORDER — METHYLPREDNISOLONE SODIUM SUCC 125 MG IJ SOLR
80.0000 mg | Freq: Every day | INTRAMUSCULAR | Status: DC
  Administered 2018-05-08: 80 mg via INTRAVENOUS

## 2018-05-08 NOTE — Patient Instructions (Signed)
[facility] Discharge Instructions for Patients Receiving Chemotherapy  Today you received the following chemotherapy agents:  Rituxan.  To help prevent nausea and vomiting after your treatment, we encourage you to take your nausea medication as directed.   If you develop nausea and vomiting that is not controlled by your nausea medication, call the clinic.   BELOW ARE SYMPTOMS THAT SHOULD BE REPORTED IMMEDIATELY:  *FEVER GREATER THAN 100.5 F  *CHILLS WITH OR WITHOUT FEVER  NAUSEA AND VOMITING THAT IS NOT CONTROLLED WITH YOUR NAUSEA MEDICATION  *UNUSUAL SHORTNESS OF BREATH  *UNUSUAL BRUISING OR BLEEDING  TENDERNESS IN MOUTH AND THROAT WITH OR WITHOUT PRESENCE OF ULCERS  *URINARY PROBLEMS  *BOWEL PROBLEMS  UNUSUAL RASH Items with * indicate a potential emergency and should be followed up as soon as possible.  Feel free to call the clinic should you have any questions or concerns. The clinic phone number is [phone].  Please show the [facility] at check-in to the Emergency Department and triage nurse.

## 2018-05-09 ENCOUNTER — Telehealth: Payer: Self-pay | Admitting: Hematology

## 2018-05-09 NOTE — Telephone Encounter (Signed)
R/s appt on 8/30 to 9/3 due to provider out of office - left message with new appt date and time. /

## 2018-05-12 ENCOUNTER — Ambulatory Visit: Payer: BLUE CROSS/BLUE SHIELD | Admitting: Hematology

## 2018-05-12 ENCOUNTER — Other Ambulatory Visit: Payer: BLUE CROSS/BLUE SHIELD

## 2018-05-12 NOTE — Progress Notes (Signed)
HEMATOLOGY/ONCOLOGY CLINIC NOTE  Date of Service: 05/16/18    Patient Care Team: [name], MD as PCP - General (Family Medicine)  CHIEF COMPLAINTS/PURPOSE OF CONSULTATION:  F/u for continued Mx of Burkitts lymphoma  HISTORY OF PRESENTING ILLNESS:   Mark Frey is a wonderful 62 y.o. male who has been referred to [place] by my colleague Dr. 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 He is accompanied today by his wife. The pt reports that he is doing well overall.   The pt started R-EPOCH every 3 weeks with neulasta support on 03/17/18. He presented to the ED with right leg swelling, found to be a DVT and bilateral PE, which resulted in the incidental finding of a lower abdomen tumor measuring 18.7 x 18.5 x 17.8 cm. He denies any abdominal pains or changes in his bowel habits prior to this finding. He first noted blood in the stools 4-5 days prior to appearing to the ED.  The pt reports that he has been taking 161m Lovenox injections once each day, except for the days in which he had blood in the stools. He hasn't had blood in his stools for the last 7 days.   He has had GI bleed related to bowel involvement with his lymphoma - this is now resolving and his hgb is stabilizing.  He notes that his scrotum and leg swelling has decreased. He denies having CP or SOB at any point from his PE.   Most recent lab results (04/06/18) of CBC w/diff, CMP, Reticulocytes  is as follows: all values are WNL except for WBC at 15.3k, RBC at 3.65, HGB at 10.5, HCT at 31.4, RDW at 17.8, PLT at 640k, ANC at 12.7k, Monocytes abs at 1.6k, Glucose at 162, Total Protein at 6.2, Albumin at 3.1, Total bilirubin at <0.2, Retic ct pct at 2.5%. Uric acid 04/06/18 was low at 3.0 LDH 04/06/18 elevated at 251  On review of systems, pt reports remaining right leg swelling, blood in the stools 7 days ago, two  bowel movements each day, left leg swelling, improved scrotal swelling, eating well, and denies problems with his port, abdominal pains, constipation, diarrhea, jaw pain, pain along the spine, problems passing urine, and any other symptoms.  Interval History:   Mark AIKEYreturns today for management, evaluation, and treatment of his Burkitt Lymphoma prior to C4 of EPOCH-R. I last saw the pt on 05/05/18 prior to discharge after C3 of EPOCH-R. The pt reports that he is doing well overall.   The pt notes that he stopped taking Lasix after 3-4 days from discharge when he noticed that his leg swelling was resolved some. The pt also notes that he had 1-2 days of mild diarrhea which was alleviated with imodium.   Lab results today (05/16/18) of CBC w/diff, CMP, and Reticulocytes is as follows: all values are WNL except for WBC at 11.5k, RBC at 3.48, HGB at 10.2, HCT at 32.0, MCHC at 31.9, RDW at 21.8, ANC at 8.4k, Monocytes abs at 1.4k, Glucose at 246, AST at 13, Total Bilirubin <0.2.   On review of systems, pt reports resolved diarrhea, decreased leg swelling, good energy levels, and denies abdominal pain, calf pain, blood in the stools, and any other symptoms.   MEDICAL HISTORY:  Past Medical History:  Diagnosis Date  . ALLERGIC RHINITIS   . Diabetes mellitus   . Hyperlipidemia  SURGICAL HISTORY: Past Surgical History:  Procedure Laterality Date  . BIOPSY  03/15/2018   Procedure: BIOPSY;  Surgeon: Milus Banister, MD;  Location: [facility];  Service: Endoscopy;;  . CLEFT PALATE REPAIR    . COLONOSCOPY N/A 03/15/2018   Procedure: COLONOSCOPY;  Surgeon: Milus Banister, MD;  Location: [facility];  Service: Endoscopy;  Laterality: N/A;  . deviated septum repair     slight improvement  . ESOPHAGOGASTRODUODENOSCOPY N/A 03/15/2018   Procedure: ESOPHAGOGASTRODUODENOSCOPY (EGD);  Surgeon: Milus Banister, MD;  Location: [name];  Service: Endoscopy;  Laterality: N/A;  . IR IMAGING  GUIDED PORT INSERTION  03/17/2018  . TONSILLECTOMY      SOCIAL HISTORY: Social History   Socioeconomic History  . Marital status: Married    Spouse name: Not on file  . Number of children: Not on file  . Years of education: Not on file  . Highest education level: Not on file  Occupational History  . Not on file  Social Needs  . Financial resource strain: Not on file  . Food insecurity:    Worry: Not on file    Inability: Not on file  . Transportation needs:    Medical: Not on file    Non-medical: Not on file  Tobacco Use  . Smoking status: Never Smoker  . Smokeless tobacco: Never Used  Substance and Sexual Activity  . Alcohol use: Yes    Comment: occasional  . Drug use: No  . Sexual activity: Yes  Lifestyle  . Physical activity:    Days per week: Not on file    Minutes per session: Not on file  . Stress: Not on file  Relationships  . Social connections:    Talks on phone: Not on file    Gets together: Not on file    Attends religious service: Not on file    Active member of club or organization: Not on file    Attends meetings of clubs or organizations: Not on file    Relationship status: Not on file  . Intimate partner violence:    Fear of current or ex partner: Not on file    Emotionally abused: Not on file    Physically abused: Not on file    Forced sexual activity: Not on file  Other Topics Concern  . Not on file  Social History Narrative   Married 1985. No kids. 4 small dogs.       Works in Financial trader, residential      Hobbies: work on cars, Haematologist, exercise as able    FAMILY HISTORY: Family History  Problem Relation Age of Onset  . Lung cancer Mother        smoker  . Brain cancer Mother        metastasis  . AAA (abdominal aortic aneurysm) Father        smoker    ALLERGIES:  is allergic to ciprofloxacin.  MEDICATIONS:  Current Outpatient Medications  Medication Sig Dispense Refill  . acetaminophen (TYLENOL) 325 MG tablet Take  2 tablets (650 mg total) by mouth every 6 (six) hours as needed for mild pain (or Fever >/= 101).    . blood glucose meter kit and supplies Dispense based on patient and insurance preference. Use daily as directed. (E11.9). 1 each 0  . Choline Fenofibrate 135 MG capsule TAKE 1 CAPSULE DAILY (Patient taking differently: Take one capsule by mouth every evening.) 90 capsule 1  . dexamethasone (DECADRON) 4 MG tablet 53m (  2 tabs) oral with breakfast for 2 days after each cycle of chemotherapy 30 tablet 1  . enoxaparin (LOVENOX) 150 MG/ML injection Inject 1 mL (150 mg total) into the skin daily. 30 Syringe 2  . furosemide (LASIX) 20 MG tablet Take 1 tablet (20 mg total) by mouth daily. Hold after 10-15 days if leg swelling resolved or if >10lbs weight loss. 30 tablet 0  . glucose blood (FREESTYLE TEST STRIPS) test strip Use to check blood sugar daily 100 each 4  . JANUMET 50-1000 MG tablet TAKE 1 TABLET TWICE DAILY  WITH MEALS 180 tablet 3  . lidocaine-prilocaine (EMLA) cream Apply 1 application topically as needed. (Patient taking differently: Apply 1 application topically daily as needed (for treatment.). ) 30 g 2  . ondansetron (ZOFRAN) 8 MG tablet Take 1 tablet (8 mg total) by mouth every 8 (eight) hours as needed for nausea or vomiting. 20 tablet 0  . senna-docusate (SENOKOT-S) 8.6-50 MG tablet Take 2 tablets by mouth at bedtime. 60 tablet 1   No current facility-administered medications for this visit.     REVIEW OF SYSTEMS:    A 10+ POINT REVIEW OF SYSTEMS WAS OBTAINED including neurology, dermatology, psychiatry, cardiac, respiratory, lymph, extremities, GI, GU, Musculoskeletal, constitutional, breasts, reproductive, HEENT.  All pertinent positives are noted in the HPI.  All others are negative.   PHYSICAL EXAMINATION: ECOG PERFORMANCE STATUS: 1 - Symptomatic but completely ambulatory  . Vitals:   05/16/18 1605  BP: 127/76  Pulse: (!) 124  Resp: 18  Temp: 98.4 F (36.9 C)  SpO2: 98%     Filed Weights   05/16/18 1605  Weight: 227 lb 6.4 oz (103.1 kg)   .Body mass index is 33.58 kg/m.  GENERAL:alert, in no acute distress and comfortable SKIN: no acute rashes, no significant lesions EYES: conjunctiva are pink and non-injected, sclera anicteric OROPHARYNX: MMM, no exudates, no oropharyngeal erythema or ulceration NECK: supple, no JVD LYMPH:  no palpable lymphadenopathy in the cervical, axillary or inguinal regions LUNGS: clear to auscultation b/l with normal respiratory effort HEART: regular rate & rhythm ABDOMEN:  normoactive bowel sounds , non tender, not distended. No palpable hepatosplenomegaly.  Extremity: 1+ right pedal edema, trace left pedal edema PSYCH: alert & oriented x 3 with fluent speech NEURO: no focal motor/sensory deficits   LABORATORY DATA:  I have reviewed the data as listed  . CBC Latest Ref Rng & Units 05/16/2018 05/05/2018 05/04/2018  WBC 4.0 - 10.3 K/uL 11.5(H) 4.5 7.0  Hemoglobin 13.0 - 17.1 g/dL 10.2(L) 11.0(L) 9.8(L)  Hematocrit 38.4 - 49.9 % 32.0(L) 33.8(L) 30.6(L)  Platelets 140 - 400 K/uL 224 507(H) 428(H)    . CMP Latest Ref Rng & Units 05/16/2018 05/05/2018 05/04/2018  Glucose 70 - 99 mg/dL 246(H) 275(H) 225(H)  BUN 8 - 23 mg/dL 11 22 20   Creatinine 0.61 - 1.24 mg/dL 0.93 0.77 0.80  Sodium 135 - 145 mmol/L 139 140 139  Potassium 3.5 - 5.1 mmol/L 4.2 4.1 4.1  Chloride 98 - 111 mmol/L 103 104 107  CO2 22 - 32 mmol/L 26 28 26   Calcium 8.9 - 10.3 mg/dL 10.2 9.7 9.5  Total Protein 6.5 - 8.1 g/dL 6.7 - -  Total Bilirubin 0.3 - 1.2 mg/dL <0.2(L) - -  Alkaline Phos 38 - 126 U/L 72 - -  AST 15 - 41 U/L 13(L) - -  ALT 0 - 44 U/L 17 - -   03/17/18 Cytogenetics:   03/17/18 Colon Bx:   03/13/18 Peritoneum  Biopsy:     RADIOGRAPHIC STUDIES:  I have personally reviewed the radiological images as listed and agreed with the findings in the report. Nm Pet Image Initial (pi) Skull Base To Thigh  Result Date: 04/18/2018 CLINICAL DATA:   Initial treatment strategy for Burkitt's lymphoma. EXAM: NUCLEAR MEDICINE PET SKULL BASE TO THIGH TECHNIQUE: 11.6 mCi F-18 FDG was injected intravenously. Full-ring PET imaging was performed from the skull base to thigh after the radiotracer. CT data was obtained and used for attenuation correction and anatomic localization. Fasting blood glucose: 99 mg/dl COMPARISON:  Chest CT 03/10/2018.  CT abdomen venogram 03/22/2018. FINDINGS: Mediastinal blood pool activity: SUV max 2.3 NECK: No hypermetabolic cervical lymph nodes are identified.There are no lesions of the pharyngeal mucosal space. Physiologic activity within the muscles of phonation. Incidental CT findings: none CHEST: There are no hypermetabolic mediastinal, hilar or axillary lymph nodes. No suspicious pulmonary activity. Incidental CT findings: Right IJ Port-A-Cath extends to the superior cavoatrial juncture. Coronary artery atherosclerosis. Aside from mild basilar scarring or atelectasis, the lungs are clear. ABDOMEN/PELVIS: There is no hypermetabolic activity within the liver, adrenal glands, spleen or pancreas. There is no hypermetabolic nodal activity. There is some hypermetabolic activity within the left lower quadrant associated with the sigmoid colon (SUV max 8.9). There are multiple sigmoid colon diverticular changes with wall thickening and surrounding inflammation. Additional scattered bowel activity, within physiologic limits. Right colonic wall thickening noted on previous CTs has markedly improved, and without associated hypermetabolic activity. Incidental CT findings: Overall, the pelvic inflammatory changes have improved. No focal masses or fluid collections are seen. There are nonobstructing right renal calculi. No hydronephrosis. Mild aortic atherosclerosis. SKELETON: Low level hypermetabolic activity throughout the bones, likely treatment related. In the clivus, the activity appears slightly more prominent (SUV max 4.7). No corresponding  lytic lesions seen on the CT images. Incidental CT findings: Low-density in the right femoral vein in the thigh, corresponding with previously demonstrated DVT. IMPRESSION: 1. Continued improvement in right colonic wall thickening and surrounding bulky masses consistent with treated lymphoma. No hypermetabolic activity to suggest residual tumor. 2. There is some hypermetabolic activity associated with the sigmoid colon which demonstrates mild wall thickening, surrounding inflammation and underlying diverticulosis, suggesting mild diverticulitis. 3. Suspected treatment related changes throughout the bone marrow. There is mildly increased activity within the clivus without clear corresponding finding on the CT images. Attention on follow-up recommended. 4. Nonobstructing right renal calculi. 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 Electronically Signed   By: Richardean Sale M.D.   On: 04/18/2018 14:22   Dg Fluoro Guide Lumbar Puncture  Result Date: 05/02/2018 CLINICAL DATA:  Burkitt lymphoma.  Intrathecal chemotherapy EXAM: FLUOROSCOPICALLY GUIDED LUMBAR PUNCTURE FOR INTRATHECAL CHEMOTHERAPY FLUOROSCOPY TIME:  Fluoroscopy Time:  0 minutes 24 seconds PROCEDURE: Informed consent was obtained from the patient prior to the procedure, including potential complications of headache, allergy, and pain. With the patient prone, the lower back was prepped with Betadine. 1% Lidocaine was used for local anesthesia. Lumbar puncture was performed at the L2-3 level using a 20 gauge needle with return of clear. 0 ml of CSF were obtained for laboratory studies. The patient tolerated the procedure well and there were no apparent complications. Intrathecal chemotherapy of methotrexate and steroid prepared by pharmacy injected into the subarachnoid space. IMPRESSION: Successful lumbar puncture with intrathecal chemotherapy. Electronically Signed   By: Franchot Gallo M.D.   On: 05/02/2018 12:45    ASSESSMENT & PLAN:   62 y.o. male  with  1.  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 No BM or CNS involvement.   04/18/18 PET/CT revealed Continued improvement in right colonic wall thickening and surrounding bulky masses consistent with treated lymphoma. No hypermetabolic activity to suggest residual tumor. There is some hypermetabolic activity associated with the sigmoid colon which demonstrates mild wall thickening, surrounding inflammation and underlying diverticulosis, suggesting mild diverticulitis. Suspected treatment related changes throughout the bone marrow. There is mildly increased activity within the clivus without clear corresponding finding on the CT images. Attention on follow-up recommended. Nonobstructing right renal calculi. 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   2. RLE DVT and b/l PE  3. GI bleeding from lymphoma involving bowels-- resolved at this time -- will need to monitor with anticoagulation.  PLAN: --Discussed pt labwork today, 05/16/18; HGB stable at 10.2, ANC at 8.4k -The pt has no prohibitive toxicities from continuing C4 of EPOCH-R with IT Methotrexate next week, with G-CSF support  Continue Lovenox-. Will hold Lovenox for 48 hours prior to upcoming intrathecal methotrexate -Continue using compression socks --Will repeat PET/CT after C4 -Recommend infection prevention strategies including crowd avoidance and limiting exposure to individuals with infections and wearing a mask in  public --Added intrathecal Methotrexate during C3, as previously decided upon with the pt and his wife -Discussed that after C6 and the subsequent PET/CT, I will recommend pt to discuss possibility of RT with Rad Onc  -Recommended that the pt continue to eat well, drink at least 48-64 oz of water each day, and walk 20-30 minutes each day.   -Will see the pt for C4 admission on 05/22/18   4. DM2 with some increased hyperglycemia with steroids Plan -monitor  -continue Oral hypoglycemics and carb consistent diet at this time    Please schedule for inpatient [facility] for 5 days - admit 05/22/2018 Rituxan and neulasta to be scheduled as outpatient on 05/29/2018 RTC with Dr [name] with labs on 06/05/2018    All of the patients questions were answered with apparent satisfaction. The patient knows to call the clinic with any problems, questions or concerns.  The total time spent in the appt was 30 minutes and more than 50% was on counseling and direct patient cares.      Sullivan Lone MD MS AAHIVMS [facility] Hematology/Oncology Physician [facility]  (Office):       [phone] (Work cell):  [phone] (Fax):           [phone]  05/16/2018 4:53 PM  I, Baldwin Jamaica, am acting as a scribe for [name]  .I have reviewed the above documentation for accuracy and completeness, and I agree with the above. [name] MD

## 2018-05-16 ENCOUNTER — Telehealth: Payer: Self-pay

## 2018-05-16 ENCOUNTER — Inpatient Hospital Stay: Payer: BLUE CROSS/BLUE SHIELD | Attending: Hematology

## 2018-05-16 ENCOUNTER — Inpatient Hospital Stay (HOSPITAL_BASED_OUTPATIENT_CLINIC_OR_DEPARTMENT_OTHER): Payer: BLUE CROSS/BLUE SHIELD | Admitting: Hematology

## 2018-05-16 VITALS — BP 127/76 | HR 124 | Temp 98.4°F | Resp 18 | Ht 69.0 in | Wt 227.4 lb

## 2018-05-16 DIAGNOSIS — C8378 Burkitt lymphoma, lymph nodes of multiple sites: Secondary | ICD-10-CM

## 2018-05-16 DIAGNOSIS — Z86711 Personal history of pulmonary embolism: Secondary | ICD-10-CM | POA: Insufficient documentation

## 2018-05-16 DIAGNOSIS — Z86718 Personal history of other venous thrombosis and embolism: Secondary | ICD-10-CM

## 2018-05-16 DIAGNOSIS — E1165 Type 2 diabetes mellitus with hyperglycemia: Secondary | ICD-10-CM | POA: Diagnosis not present

## 2018-05-16 DIAGNOSIS — E785 Hyperlipidemia, unspecified: Secondary | ICD-10-CM | POA: Insufficient documentation

## 2018-05-16 DIAGNOSIS — Z79899 Other long term (current) drug therapy: Secondary | ICD-10-CM | POA: Insufficient documentation

## 2018-05-16 DIAGNOSIS — Z7901 Long term (current) use of anticoagulants: Secondary | ICD-10-CM | POA: Diagnosis not present

## 2018-05-16 LAB — CBC WITH DIFFERENTIAL/PLATELET
BASOS PCT: 1 %
Basophils Absolute: 0.1 10*3/uL (ref 0.0–0.1)
Eosinophils Absolute: 0 10*3/uL (ref 0.0–0.5)
Eosinophils Relative: 0 %
HEMATOCRIT: 32 % — AB (ref 38.4–49.9)
HEMOGLOBIN: 10.2 g/dL — AB (ref 13.0–17.1)
LYMPHS ABS: 1.6 10*3/uL (ref 0.9–3.3)
LYMPHS PCT: 14 %
MCH: 29.3 pg (ref 27.2–33.4)
MCHC: 31.9 g/dL — AB (ref 32.0–36.0)
MCV: 92 fL (ref 79.3–98.0)
MONO ABS: 1.4 10*3/uL — AB (ref 0.1–0.9)
MONOS PCT: 12 %
NEUTROS ABS: 8.4 10*3/uL — AB (ref 1.5–6.5)
NEUTROS PCT: 73 %
Platelets: 224 10*3/uL (ref 140–400)
RBC: 3.48 MIL/uL — ABNORMAL LOW (ref 4.20–5.82)
RDW: 21.8 % — AB (ref 11.0–14.6)
WBC: 11.5 10*3/uL — ABNORMAL HIGH (ref 4.0–10.3)

## 2018-05-16 LAB — CMP (CANCER CENTER ONLY)
ALBUMIN: 3.5 g/dL (ref 3.5–5.0)
ALT: 17 U/L (ref 0–44)
ANION GAP: 10 (ref 5–15)
AST: 13 U/L — ABNORMAL LOW (ref 15–41)
Alkaline Phosphatase: 72 U/L (ref 38–126)
BUN: 11 mg/dL (ref 8–23)
CALCIUM: 10.2 mg/dL (ref 8.9–10.3)
CHLORIDE: 103 mmol/L (ref 98–111)
CO2: 26 mmol/L (ref 22–32)
Creatinine: 0.93 mg/dL (ref 0.61–1.24)
GFR, Estimated: >60 mL/min (ref >60)
GLUCOSE: 246 mg/dL — AB (ref 70–99)
Potassium: 4.2 mmol/L (ref 3.5–5.1)
Sodium: 139 mmol/L (ref 135–145)
Total Bilirubin: <0.2 mg/dL — ABNORMAL LOW (ref 0.3–1.2)
Total Protein: 6.7 g/dL (ref 6.5–8.1)

## 2018-05-16 NOTE — Telephone Encounter (Signed)
Spoke to [facility] in patient placement to request a bed for inpatient admission on Monday May 22, 2018. Patient will receive a call from bed placement on Monday once bed is available.

## 2018-05-18 ENCOUNTER — Telehealth: Payer: Self-pay

## 2018-05-18 NOTE — Telephone Encounter (Signed)
Spoke with [ patient concerning his upcoming appointment. Per 9/3 los. Mailed a letter with a calender enclosed

## 2018-05-19 ENCOUNTER — Telehealth: Payer: Self-pay

## 2018-05-19 NOTE — Telephone Encounter (Signed)
Received a call from patient's wife questioning if patient should stop Lovenox Sunday night prior to admission on Monday 05/22/18. Per [name], yes, patient should stop Lovenox on Sunday night. Patient's wife verbalized understanding.

## 2018-05-19 NOTE — Telephone Encounter (Signed)
Per 9/3 los scheduled infusion and inj to patient current schedule. Spoke with patient and he is aware of this appointment. Will mail a letter, with a calender enclosed.

## 2018-05-22 ENCOUNTER — Other Ambulatory Visit: Payer: Self-pay

## 2018-05-22 ENCOUNTER — Inpatient Hospital Stay (HOSPITAL_COMMUNITY)
Admission: AD | Admit: 2018-05-22 | Discharge: 2018-05-26 | DRG: 847 | Disposition: A | Discharge status: Home / Self Care | Payer: BLUE CROSS/BLUE SHIELD | Attending: Hematology | Admitting: Hematology

## 2018-05-22 ENCOUNTER — Encounter (HOSPITAL_COMMUNITY): Payer: Self-pay

## 2018-05-22 DIAGNOSIS — D649 Anemia, unspecified: Secondary | ICD-10-CM

## 2018-05-22 DIAGNOSIS — E785 Hyperlipidemia, unspecified: Secondary | ICD-10-CM | POA: Diagnosis present

## 2018-05-22 DIAGNOSIS — E1165 Type 2 diabetes mellitus with hyperglycemia: Secondary | ICD-10-CM

## 2018-05-22 DIAGNOSIS — Z86718 Personal history of other venous thrombosis and embolism: Secondary | ICD-10-CM | POA: Diagnosis not present

## 2018-05-22 DIAGNOSIS — T380X5A Adverse effect of glucocorticoids and synthetic analogues, initial encounter: Secondary | ICD-10-CM | POA: Diagnosis not present

## 2018-05-22 DIAGNOSIS — Z801 Family history of malignant neoplasm of trachea, bronchus and lung: Secondary | ICD-10-CM | POA: Diagnosis not present

## 2018-05-22 DIAGNOSIS — Z7189 Other specified counseling: Secondary | ICD-10-CM

## 2018-05-22 DIAGNOSIS — K922 Gastrointestinal hemorrhage, unspecified: Secondary | ICD-10-CM

## 2018-05-22 DIAGNOSIS — Z5111 Encounter for antineoplastic chemotherapy: Secondary | ICD-10-CM | POA: Diagnosis not present

## 2018-05-22 DIAGNOSIS — Z86711 Personal history of pulmonary embolism: Secondary | ICD-10-CM

## 2018-05-22 DIAGNOSIS — C8510 Unspecified B-cell lymphoma, unspecified site: Secondary | ICD-10-CM

## 2018-05-22 DIAGNOSIS — I82411 Acute embolism and thrombosis of right femoral vein: Secondary | ICD-10-CM | POA: Diagnosis not present

## 2018-05-22 DIAGNOSIS — C8378 Burkitt lymphoma, lymph nodes of multiple sites: Secondary | ICD-10-CM | POA: Diagnosis not present

## 2018-05-22 DIAGNOSIS — I2699 Other pulmonary embolism without acute cor pulmonale: Secondary | ICD-10-CM

## 2018-05-22 DIAGNOSIS — C8590 Non-Hodgkin lymphoma, unspecified, unspecified site: Secondary | ICD-10-CM | POA: Diagnosis not present

## 2018-05-22 LAB — COMPREHENSIVE METABOLIC PANEL
ALT: 27 U/L (ref 0–44)
ANION GAP: 10 (ref 5–15)
AST: 25 U/L (ref 15–41)
Albumin: 3.9 g/dL (ref 3.5–5.0)
Alkaline Phosphatase: 48 U/L (ref 38–126)
BUN: 11 mg/dL (ref 8–23)
CHLORIDE: 105 mmol/L (ref 98–111)
CO2: 24 mmol/L (ref 22–32)
Calcium: 10.1 mg/dL (ref 8.9–10.3)
Creatinine, Ser: 0.76 mg/dL (ref 0.61–1.24)
Glucose, Bld: 133 mg/dL — ABNORMAL HIGH (ref 70–99)
Potassium: 3.9 mmol/L (ref 3.5–5.1)
SODIUM: 139 mmol/L (ref 135–145)
Total Bilirubin: 0.4 mg/dL (ref 0.3–1.2)
Total Protein: 6.8 g/dL (ref 6.5–8.1)

## 2018-05-22 LAB — CBC WITH DIFFERENTIAL/PLATELET
Basophils Absolute: 0.1 10*3/uL (ref 0.0–0.1)
Basophils Relative: 1 %
Eosinophils Absolute: 0 10*3/uL (ref 0.0–0.7)
Eosinophils Relative: 0 %
HCT: 33.6 % — ABNORMAL LOW (ref 39.0–52.0)
Hemoglobin: 10.8 g/dL — ABNORMAL LOW (ref 13.0–17.0)
Lymphocytes Relative: 9 %
Lymphs Abs: 0.7 10*3/uL (ref 0.7–4.0)
MCH: 29.8 pg (ref 26.0–34.0)
MCHC: 32.1 g/dL (ref 30.0–36.0)
MCV: 92.6 fL (ref 78.0–100.0)
Monocytes Absolute: 1.1 10*3/uL — ABNORMAL HIGH (ref 0.1–1.0)
Monocytes Relative: 15 %
Neutro Abs: 5.4 10*3/uL (ref 1.7–7.7)
Neutrophils Relative %: 75 %
Platelets: 443 10*3/uL — ABNORMAL HIGH (ref 150–400)
RBC: 3.63 MIL/uL — ABNORMAL LOW (ref 4.22–5.81)
RDW: 21.3 % — ABNORMAL HIGH (ref 11.5–15.5)
WBC: 7.3 10*3/uL (ref 4.0–10.5)

## 2018-05-22 MED ORDER — LIDOCAINE-PRILOCAINE 2.5-2.5 % EX CREA: 1.0000 "application " | TOPICAL_CREAM | CUTANEOUS | Status: DC | PRN

## 2018-05-22 MED ORDER — VINCRISTINE SULFATE CHEMO INJECTION 1 MG/ML
Freq: Once | INTRAVENOUS | Status: AC
  Administered 2018-05-22: 16:00:00 via INTRAVENOUS
  Filled 2018-05-22: qty 12

## 2018-05-22 MED ORDER — SODIUM CHLORIDE 0.9% FLUSH: 10.0000 mL | INTRAVENOUS | Status: DC | PRN

## 2018-05-22 MED ORDER — SODIUM CHLORIDE 0.9 % IV SOLN
INTRAVENOUS | Status: DC
  Administered 2018-05-22: 14:00:00 via INTRAVENOUS

## 2018-05-22 MED ORDER — HOT PACK MISC ONCOLOGY
1.0000 | Freq: Once | Status: DC | PRN
  Filled 2018-05-22: qty 1

## 2018-05-22 MED ORDER — PREDNISONE 5 MG PO TABS
30.0000 mg | ORAL_TABLET | Freq: Two times a day (BID) | ORAL | Status: DC
  Administered 2018-05-22 – 2018-05-25 (×7): 30 mg via ORAL
  Filled 2018-05-22 (×9): qty 1

## 2018-05-22 MED ORDER — HEPARIN SOD (PORK) LOCK FLUSH 100 UNIT/ML IV SOLN: 250.0000 [IU] | Freq: Once | INTRAVENOUS | Status: DC | PRN

## 2018-05-22 MED ORDER — SITAGLIPTIN PHOS-METFORMIN HCL 50-1000 MG PO TABS: 1.0000 | ORAL_TABLET | Freq: Two times a day (BID) | ORAL | Status: DC

## 2018-05-22 MED ORDER — COLD PACK MISC ONCOLOGY
1.0000 | Freq: Once | Status: DC | PRN
  Filled 2018-05-22: qty 1

## 2018-05-22 MED ORDER — METFORMIN HCL 500 MG PO TABS: 1000.0000 mg | ORAL_TABLET | Freq: Two times a day (BID) | ORAL | Status: DC

## 2018-05-22 MED ORDER — HEPARIN SOD (PORK) LOCK FLUSH 100 UNIT/ML IV SOLN
500.0000 [IU] | Freq: Once | INTRAVENOUS | Status: AC | PRN
  Administered 2018-05-26: 500 [IU]
  Filled 2018-05-22: qty 5

## 2018-05-22 MED ORDER — METFORMIN HCL 500 MG PO TABS
1000.0000 mg | ORAL_TABLET | Freq: Two times a day (BID) | ORAL | Status: DC
  Administered 2018-05-22 – 2018-05-25 (×7): 1000 mg via ORAL
  Filled 2018-05-22 (×8): qty 2

## 2018-05-22 MED ORDER — LINAGLIPTIN 5 MG PO TABS: 5.0000 mg | ORAL_TABLET | Freq: Every day | ORAL | Status: DC

## 2018-05-22 MED ORDER — ALTEPLASE 2 MG IJ SOLR
2.0000 mg | Freq: Once | INTRAMUSCULAR | Status: DC | PRN
  Filled 2018-05-22: qty 2

## 2018-05-22 MED ORDER — DIPHENHYDRAMINE HCL 50 MG PO CAPS
50.0000 mg | ORAL_CAPSULE | Freq: Once | ORAL | Status: AC
  Administered 2018-05-22: 50 mg via ORAL
  Filled 2018-05-22: qty 1

## 2018-05-22 MED ORDER — ACETAMINOPHEN 325 MG PO TABS
650.0000 mg | ORAL_TABLET | Freq: Once | ORAL | Status: AC
  Administered 2018-05-22: 650 mg via ORAL
  Filled 2018-05-22: qty 2

## 2018-05-22 MED ORDER — FUROSEMIDE 20 MG PO TABS
20.0000 mg | ORAL_TABLET | Freq: Every day | ORAL | Status: DC
  Administered 2018-05-22 – 2018-05-25 (×3): 20 mg via ORAL
  Filled 2018-05-22 (×3): qty 1

## 2018-05-22 MED ORDER — LINAGLIPTIN 5 MG PO TABS
5.0000 mg | ORAL_TABLET | Freq: Every day | ORAL | Status: DC
  Administered 2018-05-23 – 2018-05-25 (×3): 5 mg via ORAL
  Filled 2018-05-22 (×4): qty 1

## 2018-05-22 MED ORDER — SENNOSIDES-DOCUSATE SODIUM 8.6-50 MG PO TABS: 2.0000 | ORAL_TABLET | Freq: Every evening | ORAL | Status: DC | PRN

## 2018-05-22 MED ORDER — ACETAMINOPHEN 325 MG PO TABS: 650.0000 mg | ORAL_TABLET | Freq: Four times a day (QID) | ORAL | Status: DC | PRN

## 2018-05-22 MED ORDER — SODIUM CHLORIDE 0.9 % IV SOLN
Freq: Once | INTRAVENOUS | Status: AC
  Administered 2018-05-22: 8 mg via INTRAVENOUS
  Filled 2018-05-22: qty 4

## 2018-05-22 MED ORDER — SODIUM CHLORIDE 0.9% FLUSH: 3.0000 mL | INTRAVENOUS | Status: DC | PRN

## 2018-05-22 NOTE — Progress Notes (Signed)
Doses from doxorubicin, etoposide and vincristine verified with Nancy Marus, RN.

## 2018-05-22 NOTE — H&P (Signed)
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 male who is admitted today for C4 EPOCH-R and 3rd dose of prophylactic IT Methotrexate treatment of his Burkitt Lymphoma.  The pt reports that he is doing very well overall and has no acute issues since his last clinic visit.  The pt notes that he has no new concerns and is ready to begin C4 of EPOCH-R. Notes that it took a little longer from the pounds back after cycle 3 with regards to grade 1-2 fatigue.  His last dose of Lovenox was Saturday night 05/20/2018, in anticipation of intrathecal methotrexate tomorrow 05/23/18. He notes that his right leg swelling is stable. He continues to deny abdominal pains, blood in the stools and other bowel abnormalities.   Lab results today were reviewed with him and are noted to be stable.  On review of systems stable right leg swelling,  moving his bowels, eating well, and denies headaches, other skin rashes, new fatigue, fevers, chills, night sweats, abdominal pains, blood in the stools, and any other symptoms.    MEDICAL HISTORY:  Past Medical History:  Diagnosis Date  . ALLERGIC RHINITIS   . Diabetes mellitus   . Hyperlipidemia     SURGICAL HISTORY: Past Surgical History:  Procedure Laterality Date  . BIOPSY  03/15/2018   Procedure: BIOPSY;  Surgeon: Milus Banister, MD;  Location: [facility];  Service: Endoscopy;;  . CLEFT PALATE REPAIR    . COLONOSCOPY N/A 03/15/2018   Procedure: COLONOSCOPY;  Surgeon: Milus Banister, MD;  Location: [facility];  Service: Endoscopy;  Laterality: N/A;  . deviated septum repair     slight improvement  . ESOPHAGOGASTRODUODENOSCOPY N/A 03/15/2018   Procedure: ESOPHAGOGASTRODUODENOSCOPY (EGD);   Surgeon: Milus Banister, MD;  Location: [name];  Service: Endoscopy;  Laterality: N/A;  . IR IMAGING GUIDED PORT INSERTION  03/17/2018  . TONSILLECTOMY      SOCIAL HISTORY: Social History   Socioeconomic History  . Marital status: Married    Spouse name: lisa  . Number of children: 0  . Years of education: Not on file  . Highest education level: Not on file  Occupational History  . Not on file  Social Needs  . Financial resource strain: Not hard at all  . Food insecurity:    Worry: Never true    Inability: Never true  . Transportation needs:    Medical: No    Non-medical: No  Tobacco Use  . Smoking status: Never Smoker  . Smokeless tobacco: Never Used  Substance and Sexual Activity  . Alcohol use: Yes    Comment: occasional  . Drug use: No  . Sexual activity: Yes  Lifestyle  . Physical activity:    Days per week: 0 days    Minutes per session: 0 min  . Stress: Not at all  Relationships  . Social connections:    Talks on phone: More than three times a week    Gets together: More than three times a week    Attends religious service: 1 to 4 times per year    Active member of club or organization: No    Attends meetings of clubs or organizations: Never    Relationship status: Married  . Intimate  partner violence:    Fear of current or ex partner: No    Emotionally abused: No    Physically abused: No    Forced sexual activity: No  Other Topics Concern  . Not on file  Social History Narrative   Married 1985. No kids. 4 small dogs.       Works in Financial trader, residential      Hobbies: work on cars, Haematologist, exercise as able    FAMILY HISTORY: Family History  Problem Relation Age of Onset  . Lung cancer Mother        smoker  . Brain cancer Mother        metastasis  . AAA (abdominal aortic aneurysm) Father        smoker    ALLERGIES:  is allergic to ciprofloxacin.  MEDICATIONS:  Current Facility-Administered Medications  Medication  Dose Route Frequency Provider Last Rate Last Dose  . acetaminophen (TYLENOL) tablet 650 mg  650 mg Oral Q6H PRN [name], MD      . furosemide (LASIX) tablet 20 mg  20 mg Oral Daily [name], MD      . lidocaine-prilocaine (EMLA) cream 1 application  1 application Topical PRN [name], MD      . senna-docusate (Senokot-S) tablet 2 tablet  2 tablet Oral QHS PRN [name], MD      . sitaGLIPtin-metformin (JANUMET) 50-1000 MG per tablet 1 tablet  1 tablet Oral BID WC [name], MD        REVIEW OF SYSTEMS:    10 Point review of Systems was done is negative except as noted above.  PHYSICAL EXAMINATION: ECOG PERFORMANCE STATUS: 1 - Symptomatic but completely ambulatory  . Vitals:   05/22/18 1002  BP: 139/88  Pulse: (!) 101  Resp: 18  Temp: 98.9 F (37.2 C)  SpO2: 98%   Filed Weights   05/23/18 0604  Weight: 230 lb 1.6 oz (104.4 kg)   .Body mass index is 33.98 kg/m.  GENERAL:alert, in no acute distress and comfortable SKIN: no acute rashes, no significant lesions EYES: conjunctiva are pink and non-injected, sclera anicteric OROPHARYNX: MMM, no exudates, no oropharyngeal erythema or ulceration NECK: supple, no JVD LYMPH:  no palpable lymphadenopathy in the cervical, axillary or inguinal regions LUNGS: clear to auscultation b/l with normal respiratory effort HEART: regular rate & rhythm ABDOMEN:  normoactive bowel sounds , non tender, not distended. Extremity: 1+ right pedal edema, trace left pedal edema PSYCH: alert & oriented x 3 with fluent speech NEURO: no focal motor/sensory deficits  LABORATORY DATA:  I have reviewed the data as listed  . CBC Latest Ref Rng & Units 05/22/2018 05/16/2018  WBC 4.0 - 10.5 K/uL 7.3 11.5(H)  Hemoglobin 13.0 - 17.0 g/dL 10.8(L) 10.2(L)  Hematocrit 39.0 - 52.0 % 33.6(L) 32.0(L)  Platelets 150 - 400 K/uL 443(H) 224    . CMP Latest Ref Rng & Units 05/22/2018 05/16/2018  Glucose 70 - 99  mg/dL 133(H) 246(H)  BUN 8 - 23 mg/dL 11 11  Creatinine 0.61 - 1.24 mg/dL 0.76 0.93  Sodium 135 - 145 mmol/L 139 139  Potassium 3.5 - 5.1 mmol/L 3.9 4.2  Chloride 98 - 111 mmol/L 105 103  CO2 22 - 32 mmol/L 24 26  Calcium 8.9 - 10.3 mg/dL 10.1 10.2  Total Protein 6.5 - 8.1 g/dL 6.8 6.7  Total Bilirubin 0.3 - 1.2 mg/dL 0.4 <0.2(L)  Alkaline Phos 38 - 126 U/L 48 72  AST 15 - 41 U/L 25 13(L)  ALT 0 - 44 U/L 27 17     RADIOGRAPHIC STUDIES: I have personally reviewed the radiological images as listed and agreed with the findings in the report. Dg Fluoro Guide Lumbar Puncture  Result Date: 05/02/2018 CLINICAL DATA:  Burkitt lymphoma.  Intrathecal chemotherapy EXAM: FLUOROSCOPICALLY GUIDED LUMBAR PUNCTURE FOR INTRATHECAL CHEMOTHERAPY FLUOROSCOPY TIME:  Fluoroscopy Time:  0 minutes 24 seconds PROCEDURE: Informed consent was obtained from the patient prior to the procedure, including potential complications of headache, allergy, and pain. With the patient prone, the lower back was prepped with Betadine. 1% Lidocaine was used for local anesthesia. Lumbar puncture was performed at the L2-3 level using a 20 gauge needle with return of clear. 0 ml of CSF were obtained for laboratory studies. The patient tolerated the procedure well and there were no apparent complications. Intrathecal chemotherapy of methotrexate and steroid prepared by pharmacy injected into the subarachnoid space. IMPRESSION: Successful lumbar puncture with intrathecal chemotherapy. Electronically Signed   By: Franchot Gallo M.D.   On: 05/02/2018 12:45    ASSESSMENT & PLAN:   62 y.o. male with  1. 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 No BM or CNS involvement.   04/18/18 PET/CT revealed Continued improvement in right colonic wall thickening and surrounding bulky masses consistent with treated lymphoma. No hypermetabolic activity to suggest residual tumor. There is some hypermetabolic activity associated with the sigmoid colon which demonstrates mild wall thickening, surrounding inflammation and underlying diverticulosis, suggesting mild diverticulitis. Suspected treatment related changes throughout the bone marrow. There is mildly increased activity within the clivus without clear corresponding finding on the CT images. Attention on follow-up recommended. Nonobstructing right renal calculi. 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   2. RLE DVT and b/l PE  3. s/p GI bleeding from lymphoma involving bowels-- resolved at this time -- will need to monitor with anticoagulation.  PLAN: -Labs reviewed stable  -The pt has no prohibitive toxicities from continuing C4 of EPOCH-R and 3rd dose of IT Methotrexate this week. -Rituxan and Neulasta have been scheduled on 05/19/2018 as outpatient. -IR request for fluoroscopic guided lumbar puncture for intrathecal methotrexate requested for D2 on 05/23/2018. -Lovenox currently held for upcoming intrathecal methotrexate.  Patient asked to ambulate and use sequential compression devices for DVT prophylaxis at this time. -Daily labs -Does not need allopurinol at this time for tumor lysis syndrome prophylaxis. -We will plan to restart Lovenox prophylactic dose for 24 hours and then go back to therapeutic dose after intrathecal methotrexate. ---Will repeat PET/CT after C4 -After C6 and the subsequent PET/CT, I will recommend pt to discuss possibility of RT with Rad  Onc -chemotherapy orders reviewed, signed and plan discussed with oncology unit staff.  4. DM2 with some increased hyperglycemia with steroids Plan -monitor  -continue Oral hypoglycemics and carb consistent diet at this time  DISPO - likely discharge on Friday after completion of planned chemotherapy.  All of the patients questions were answered with apparent satisfaction. The patient knows to call the clinic  with any problems, questions or concerns.  The total time spent in the appt was 70 minutes and more than 50% was on counseling and direct patient cares.    Sullivan Lone MD MS AAHIVMS [facility] Hematology/Oncology Physician [facility]  (Office):       [phone] (Work cell):  [phone] (Fax):           [phone]  05/22/2018 10:33 AM  I, Baldwin Jamaica, am acting as a scribe for [name]  .I have reviewed the above documentation for accuracy and completeness, and I agree with the above. Sullivan Lone MD MS

## 2018-05-23 ENCOUNTER — Other Ambulatory Visit: Payer: Self-pay | Admitting: Family Medicine

## 2018-05-23 ENCOUNTER — Inpatient Hospital Stay (HOSPITAL_COMMUNITY): Payer: BLUE CROSS/BLUE SHIELD

## 2018-05-23 DIAGNOSIS — I2699 Other pulmonary embolism without acute cor pulmonale: Secondary | ICD-10-CM

## 2018-05-23 LAB — BASIC METABOLIC PANEL
ANION GAP: 10 (ref 5–15)
BUN: 14 mg/dL (ref 8–23)
CALCIUM: 9.6 mg/dL (ref 8.9–10.3)
CO2: 22 mmol/L (ref 22–32)
CREATININE: 0.8 mg/dL (ref 0.61–1.24)
Chloride: 107 mmol/L (ref 98–111)
GFR calc Af Amer: >60 mL/min (ref >60)
GFR calc non Af Amer: >60 mL/min (ref >60)
GLUCOSE: 277 mg/dL — AB (ref 70–99)
Potassium: 4.4 mmol/L (ref 3.5–5.1)
Sodium: 139 mmol/L (ref 135–145)

## 2018-05-23 LAB — CBC
HEMATOCRIT: 32.3 % — AB (ref 39.0–52.0)
Hemoglobin: 10.4 g/dL — ABNORMAL LOW (ref 13.0–17.0)
MCH: 29.9 pg (ref 26.0–34.0)
MCHC: 32.2 g/dL (ref 30.0–36.0)
MCV: 92.8 fL (ref 78.0–100.0)
Platelets: 442 10*3/uL — ABNORMAL HIGH (ref 150–400)
RBC: 3.48 MIL/uL — ABNORMAL LOW (ref 4.22–5.81)
RDW: 20.7 % — AB (ref 11.5–15.5)
WBC: 8.6 10*3/uL (ref 4.0–10.5)

## 2018-05-23 LAB — PROTIME-INR
INR: 0.92
Prothrombin Time: 12.3 seconds (ref 11.4–15.2)

## 2018-05-23 LAB — APTT: aPTT: 21 seconds — ABNORMAL LOW (ref 24–36)

## 2018-05-23 MED ORDER — SODIUM CHLORIDE 0.9 % IJ SOLN
Freq: Once | INTRAMUSCULAR | Status: AC
  Administered 2018-05-23: 14:00:00 via INTRATHECAL
  Filled 2018-05-23: qty 0.48

## 2018-05-23 MED ORDER — SODIUM CHLORIDE 0.9 % IV SOLN
Freq: Once | INTRAVENOUS | Status: AC
  Administered 2018-05-23: 18 mg via INTRAVENOUS
  Filled 2018-05-23: qty 4

## 2018-05-23 MED ORDER — VINCRISTINE SULFATE CHEMO INJECTION 1 MG/ML
Freq: Once | INTRAVENOUS | Status: AC
  Administered 2018-05-23: 14:00:00 via INTRAVENOUS
  Filled 2018-05-23: qty 12

## 2018-05-23 MED ORDER — LIDOCAINE HCL 1 % IJ SOLN
INTRAMUSCULAR | Status: AC
  Filled 2018-05-23: qty 20

## 2018-05-23 NOTE — Progress Notes (Signed)
HEMATOLOGY/ONCOLOGY INPATIENT PROGRESS NOTE  Date of Service: 05/23/2018  Inpatient Attending: .[name], MD   SUBJECTIVE:   Mark Frey reports that he is doing well overall and continues to tolerate C4 very well. He has been eating well, moving his bowels well, and endorses good energy levels.  Awaiting LP today for IT MTX The pt reports that he has not had any abdominal pains, concerns for GI bleeding, nausea, or vomiting.   Lab results today (05/23/18) of CBC, BMP, and Reticulocytes is as follows: all values are WNL except for RBC at 3.48, HGB at 10.4, HCT at 32.3, RDW at 20.7, PLT at 442k, Glucose at 277.  On review of systems, pt reports good energy levels, eating well, minimal ankle swelling, and denies abdominal pains, concerns for bleeding, nausea, vomiting, diarrhea, headaches, SOB, CP, and any other symptoms.    OBJECTIVE:  NAD  PHYSICAL EXAMINATION: . Vitals:   05/22/18 1405 05/22/18 2223 05/23/18 0234 05/23/18 0604  BP: 128/78 127/84  128/76  Pulse: (!) 104 91  86  Resp: 17 16  16   Temp: 98 F (36.7 C) 97.9 F (36.6 C)  98 F (36.7 C)  TempSrc: Oral Oral  Oral  SpO2:  97%  97%  Weight:    230 lb 1.6 oz (104.4 kg)  Height:   5' 9"  (1.753 m)    Filed Weights   05/23/18 0604  Weight: 230 lb 1.6 oz (104.4 kg)   .Body mass index is 33.98 kg/m.  GENERAL:alert, in no acute distress and comfortable SKIN: skin color, texture, turgor are normal, no rashes or significant lesions EYES: normal, conjunctiva are pink and non-injected, sclera clear OROPHARYNX:no exudate, no erythema and lips, buccal mucosa, and tongue normal  NECK: supple, no JVD, thyroid normal size, non-tender, without nodularity LYMPH:  no palpable lymphadenopathy in the cervical, axillary or inguinal LUNGS: clear to auscultation with normal respiratory effort HEART: regular rate & rhythm,  no murmurs and 1+ right pedal edema and trace left pedal edema  ABDOMEN: abdomen soft,  non-tender, normoactive bowel sounds  Musculoskeletal: no cyanosis of digits and no clubbing  PSYCH: alert & oriented x 3 with fluent speech NEURO: no focal motor/sensory deficits  MEDICAL HISTORY:  Past Medical History:  Diagnosis Date  . ALLERGIC RHINITIS   . Diabetes mellitus   . Hyperlipidemia     SURGICAL HISTORY: Past Surgical History:  Procedure Laterality Date  . BIOPSY  03/15/2018   Procedure: BIOPSY;  Surgeon: Milus Banister, MD;  Location: [facility];  Service: Endoscopy;;  . CLEFT PALATE REPAIR    . COLONOSCOPY N/A 03/15/2018   Procedure: COLONOSCOPY;  Surgeon: Milus Banister, MD;  Location: [facility];  Service: Endoscopy;  Laterality: N/A;  . deviated septum repair     slight improvement  . ESOPHAGOGASTRODUODENOSCOPY N/A 03/15/2018   Procedure: ESOPHAGOGASTRODUODENOSCOPY (EGD);  Surgeon: Milus Banister, MD;  Location: [name];  Service: Endoscopy;  Laterality: N/A;  . IR IMAGING GUIDED PORT INSERTION  03/17/2018  . TONSILLECTOMY      SOCIAL HISTORY: Social History   Socioeconomic History  . Marital status: Married    Spouse name: lisa  . Number of children: 0  . Years of education: Not on file  . Highest education level: Not on file  Occupational History  . Not on file  Social Needs  . Financial resource strain: Not hard at all  . Food insecurity:    Worry: Never true    Inability: Never  true  . Transportation needs:    Medical: No    Non-medical: No  Tobacco Use  . Smoking status: Never Smoker  . Smokeless tobacco: Never Used  Substance and Sexual Activity  . Alcohol use: Yes    Comment: occasional  . Drug use: No  . Sexual activity: Yes  Lifestyle  . Physical activity:    Days per week: 0 days    Minutes per session: 0 min  . Stress: Not at all  Relationships  . Social connections:    Talks on phone: More than three times a week    Gets together: More than three times a week    Attends religious service: 1 to 4 times per year     Active member of club or organization: No    Attends meetings of clubs or organizations: Never    Relationship status: Married  . Intimate partner violence:    Fear of current or ex partner: No    Emotionally abused: No    Physically abused: No    Forced sexual activity: No  Other Topics Concern  . Not on file  Social History Narrative   Married 1985. No kids. 4 small dogs.       Works in Financial trader, residential      Hobbies: work on cars, Haematologist, exercise as able    FAMILY HISTORY: Family History  Problem Relation Age of Onset  . Lung cancer Mother        smoker  . Brain cancer Mother        metastasis  . AAA (abdominal aortic aneurysm) Father        smoker    ALLERGIES:  is allergic to ciprofloxacin.  MEDICATIONS:  Scheduled Meds: . DOXOrubicin/vinCRIStine/etoposide CHEMO IV infusion for Inpatient CI   Intravenous Once  . DOXOrubicin/vinCRIStine/etoposide CHEMO IV infusion for Inpatient CI   Intravenous Once  . furosemide  20 mg Oral Daily  . linagliptin  5 mg Oral Daily  . metFORMIN  1,000 mg Oral BID WC  . methotrexate INTRATHECAL (+/- HYDROCORTISONE,Ara-C)   Intrathecal Once  . predniSONE  30 mg Oral BID WC   Continuous Infusions: . sodium chloride 10 mL/hr at 05/23/18 0757  . ondansetron (ZOFRAN) with dexamethasone (DECADRON) IV     PRN Meds:.acetaminophen, alteplase, Cold Pack, heparin lock flush, heparin lock flush, Hot Pack, lidocaine-prilocaine, senna-docusate, sodium chloride flush, sodium chloride flush  REVIEW OF SYSTEMS:    10 Point review of Systems was done is negative except as noted above.   LABORATORY DATA:  I have reviewed the data as listed  . CBC Latest Ref Rng & Units 05/23/2018 05/22/2018 05/16/2018  WBC 4.0 - 10.5 K/uL 8.6 7.3 11.5(H)  Hemoglobin 13.0 - 17.0 g/dL 10.4(L) 10.8(L) 10.2(L)  Hematocrit 39.0 - 52.0 % 32.3(L) 33.6(L) 32.0(L)  Platelets 150 - 400 K/uL 442(H) 443(H) 224    . CMP Latest Ref Rng & Units 05/23/2018  05/22/2018 05/16/2018  Glucose 70 - 99 mg/dL 277(H) 133(H) 246(H)  BUN 8 - 23 mg/dL 14 11 11   Creatinine 0.61 - 1.24 mg/dL 0.80 0.76 0.93  Sodium 135 - 145 mmol/L 139 139 139  Potassium 3.5 - 5.1 mmol/L 4.4 3.9 4.2  Chloride 98 - 111 mmol/L 107 105 103  CO2 22 - 32 mmol/L 22 24 26   Calcium 8.9 - 10.3 mg/dL 9.6 10.1 10.2  Total Protein 6.5 - 8.1 g/dL - 6.8 6.7  Total Bilirubin 0.3 - 1.2 mg/dL - 0.4 <0.2(L)  Alkaline  Phos 38 - 126 U/L - 48 72  AST 15 - 41 U/L - 25 13(L)  ALT 0 - 44 U/L - 27 17     RADIOGRAPHIC STUDIES: I have personally reviewed the radiological images as listed and agreed with the findings in the report. Dg Fluoro Guide Lumbar Puncture  Result Date: 05/02/2018 CLINICAL DATA:  Burkitt lymphoma.  Intrathecal chemotherapy EXAM: FLUOROSCOPICALLY GUIDED LUMBAR PUNCTURE FOR INTRATHECAL CHEMOTHERAPY FLUOROSCOPY TIME:  Fluoroscopy Time:  0 minutes 24 seconds PROCEDURE: Informed consent was obtained from the patient prior to the procedure, including potential complications of headache, allergy, and pain. With the patient prone, the lower back was prepped with Betadine. 1% Lidocaine was used for local anesthesia. Lumbar puncture was performed at the L2-3 level using a 20 gauge needle with return of clear. 0 ml of CSF were obtained for laboratory studies. The patient tolerated the procedure well and there were no apparent complications. Intrathecal chemotherapy of methotrexate and steroid prepared by pharmacy injected into the subarachnoid space. IMPRESSION: Successful lumbar puncture with intrathecal chemotherapy. Electronically Signed   By: Franchot Gallo M.D.   On: 05/02/2018 12:45    ASSESSMENT & PLAN:  62 y.o. male with  1. 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 No BM or CNS involvement.   04/18/18 PET/CT revealed Continued improvement in right colonic wall thickening and surrounding bulky masses consistent with treated lymphoma. No hypermetabolic activity to suggest residual tumor. There is some hypermetabolic activity associated with the sigmoid colon which demonstrates mild wall thickening, surrounding inflammation and underlying diverticulosis, suggesting mild diverticulitis. Suspected treatment related changes throughout the bone marrow. There is mildly increased activity within the clivus without clear corresponding finding on the CT images. Attention on follow-up recommended. Nonobstructing right renal calculi. 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   2. RLE DVT and b/l PE - on anticoagulation  3. s/p GI bleeding from lymphoma involving bowels-- resolved at this time -- will need to monitor with anticoagulation.  PLAN: -Rituxan and Neulasta have been scheduled on 05/19/2018 as outpatient. -IR request for fluoroscopic guided lumbar puncture for intrathecal methotrexate requested for D2 on 05/23/2018. Bedrest for 6 hours post LP -Daily labs -Will repeat PET/CT after C4 -After C6 and the subsequent PET/CT, I will recommend pt to discuss possibility of RT with Rad Onc -chemotherapy orders reviewed, signed and plan discussed with oncology unit staff. -Discussed pt labwork today, 05/23/18; blood counts and chemistries are stable -The pt has no prohibitive toxicities from continuing C4 EPOCH-R at this time.   -Continue with 3rd dose of IT MTX today and lay flat for 4-6 hours. Lovenox was held. -Begin therapeutic Lovenox tomorrow  -Continue  with SCD's and walking around intermittently  4. DM2 with some increased hyperglycemia with steroids Plan -monitor  -continue Oral hypoglycemics and carb consistent diet at this time  DISPO - likely discharge on Friday after completion of planned chemotherapy.   The total time spent in the appt was 25 minutes and more than 50% was on counseling and direct patient cares.  Sullivan Lone MD MS AAHIVMS [facility] Hematology/Oncology Physician [facility]  (Office):       [phone] (Work cell):  [phone] (Fax):           [phone]  05/23/2018 11:40 AM  I, Baldwin Jamaica, am acting as a scribe for [name]  .I have reviewed the above documentation for accuracy and completeness, and I agree with the above.  Sullivan Lone MD MS

## 2018-05-23 NOTE — Progress Notes (Signed)
Chemo dosages and dilutions verified by 2 chemo RNs

## 2018-05-23 NOTE — Procedures (Signed)
CLINICAL DATA: [Lymphoma.] EXAM: FLUOROSCOPICALLY GUIDED LUMBAR PUNCTURE FOR INTRATHECAL CHEMOTHERAPY TECHNIQUE: Informed consent was obtained from the patient prior to the procedure, including potential complications of headache, allergy, and pain. A 'time out' was performed. With the patient prone, the lower back was prepped with Betadine. 1% Lidocaine was used for local anesthesia. Lumbar puncture was performed at the [L3-4] using a [28] gauge needle with return of clear CSF.  methotrexate was injected into the subarachnoid space, as prepared by pharmacy. The patient tolerated the procedure well without apparent complication.   FLUOROSCOPY TIME: [5 minutes and 0 seconds    IMPRESSION: [Intrathecal injection of chemotherapy without complication

## 2018-05-24 ENCOUNTER — Other Ambulatory Visit: Payer: Self-pay | Admitting: Hematology

## 2018-05-24 DIAGNOSIS — C8510 Unspecified B-cell lymphoma, unspecified site: Secondary | ICD-10-CM

## 2018-05-24 LAB — CBC
HCT: 30.9 % — ABNORMAL LOW (ref 39.0–52.0)
HEMOGLOBIN: 10 g/dL — AB (ref 13.0–17.0)
MCH: 29.9 pg (ref 26.0–34.0)
MCHC: 32.4 g/dL (ref 30.0–36.0)
MCV: 92.2 fL (ref 78.0–100.0)
Platelets: 446 10*3/uL — ABNORMAL HIGH (ref 150–400)
RBC: 3.35 MIL/uL — ABNORMAL LOW (ref 4.22–5.81)
RDW: 20.5 % — ABNORMAL HIGH (ref 11.5–15.5)
WBC: 7.9 10*3/uL (ref 4.0–10.5)

## 2018-05-24 LAB — BASIC METABOLIC PANEL
Anion gap: 10 (ref 5–15)
BUN: 16 mg/dL (ref 8–23)
CO2: 23 mmol/L (ref 22–32)
CREATININE: 0.75 mg/dL (ref 0.61–1.24)
Calcium: 9.2 mg/dL (ref 8.9–10.3)
Chloride: 107 mmol/L (ref 98–111)
GFR calc Af Amer: >60 mL/min (ref >60)
GFR calc non Af Amer: >60 mL/min (ref >60)
GLUCOSE: 313 mg/dL — AB (ref 70–99)
Potassium: 4.5 mmol/L (ref 3.5–5.1)
SODIUM: 140 mmol/L (ref 135–145)

## 2018-05-24 MED ORDER — VINCRISTINE SULFATE CHEMO INJECTION 1 MG/ML
Freq: Once | INTRAVENOUS | Status: AC
  Administered 2018-05-24: 13:00:00 via INTRAVENOUS
  Filled 2018-05-24: qty 12

## 2018-05-24 MED ORDER — SODIUM CHLORIDE 0.9 % IV SOLN
Freq: Once | INTRAVENOUS | Status: AC
  Administered 2018-05-24: 18 mg via INTRAVENOUS
  Filled 2018-05-24: qty 4

## 2018-05-24 MED ORDER — ENOXAPARIN SODIUM 150 MG/ML ~~LOC~~ SOLN
150.0000 mg | SUBCUTANEOUS | Status: DC
  Administered 2018-05-24 – 2018-05-25 (×2): 150 mg via SUBCUTANEOUS
  Filled 2018-05-24 (×3): qty 1

## 2018-05-24 NOTE — Progress Notes (Signed)
HEMATOLOGY/ONCOLOGY INPATIENT PROGRESS NOTE  Date of Service: 05/24/2018  Inpatient Attending: .[name], MD   SUBJECTIVE:   Mark Frey reports that he is doing well overall and continues to tolerate EPOCH-R well. He denies any headaches.  LP was a little more involved this time but eventually went well. No headaches. The pt reports that he has not had any problems. He was placed on Lasix earlier this morning and notes that his leg swelling has decreased some.   Lab results today (05/24/18) of CBC, and BMP is as follows: all values are WNL except for RBC at 3.35, HGB at 10.0, HCT at 30.9, RDW at 20.5, PLT at 446k, Glucose at 313.  On review of systems, pt reports good energy levels, strong appetite, reduced leg swelling, moving his bowels well,  and denies headaches, nausea, vomiting, diarrhea, abdominal pains, blood in the stools, difficulty urinating, mouth sores, and any other symptoms.    OBJECTIVE:  NAD  PHYSICAL EXAMINATION: . Vitals:   05/23/18 2056 05/24/18 0458 05/24/18 1505 05/24/18 2257  BP: (!) 153/82 137/82 128/80 126/78  Pulse: (!) 113 92 (!) 103 84  Resp: 16 17 18 16   Temp: 98.3 F (36.8 C) 97.9 F (36.6 C) 97.9 F (36.6 C) 98.3 F (36.8 C)  TempSrc: Oral Oral Oral Oral  SpO2: 97% 96% 100% 100%  Weight:      Height:       Filed Weights   05/23/18 0604  Weight: 230 lb 1.6 oz (104.4 kg)   .Body mass index is 33.98 kg/m.  GENERAL:alert, in no acute distress and comfortable SKIN: no acute rashes, no significant lesions EYES: conjunctiva are pink and non-injected, sclera anicteric OROPHARYNX: MMM, no exudates, no oropharyngeal erythema or ulceration NECK: supple, no JVD LYMPH:  no palpable lymphadenopathy in the cervical, axillary or inguinal regions LUNGS: clear to auscultation b/l with normal respiratory effort HEART: regular rate & rhythm ABDOMEN:  normoactive bowel sounds , non tender, not distended. No palpable  hepatosplenomegaly.  Extremity: 1+ right pedal edema, trace left pedal edema  PSYCH: alert & oriented x 3 with fluent speech NEURO: no focal motor/sensory deficits   MEDICAL HISTORY:  Past Medical History:  Diagnosis Date  . ALLERGIC RHINITIS   . Diabetes mellitus   . Hyperlipidemia     SURGICAL HISTORY: Past Surgical History:  Procedure Laterality Date  . BIOPSY  03/15/2018   Procedure: BIOPSY;  Surgeon: Milus Banister, MD;  Location: [facility];  Service: Endoscopy;;  . CLEFT PALATE REPAIR    . COLONOSCOPY N/A 03/15/2018   Procedure: COLONOSCOPY;  Surgeon: Milus Banister, MD;  Location: [facility];  Service: Endoscopy;  Laterality: N/A;  . deviated septum repair     slight improvement  . ESOPHAGOGASTRODUODENOSCOPY N/A 03/15/2018   Procedure: ESOPHAGOGASTRODUODENOSCOPY (EGD);  Surgeon: Milus Banister, MD;  Location: [name];  Service: Endoscopy;  Laterality: N/A;  . IR IMAGING GUIDED PORT INSERTION  03/17/2018  . TONSILLECTOMY      SOCIAL HISTORY: Social History   Socioeconomic History  . Marital status: Married    Spouse name: lisa  . Number of children: 0  . Years of education: Not on file  . Highest education level: Not on file  Occupational History  . Not on file  Social Needs  . Financial resource strain: Not hard at all  . Food insecurity:    Worry: Never true    Inability: Never true  . Transportation needs:    Medical:  No    Non-medical: No  Tobacco Use  . Smoking status: Never Smoker  . Smokeless tobacco: Never Used  Substance and Sexual Activity  . Alcohol use: Yes    Comment: occasional  . Drug use: No  . Sexual activity: Yes  Lifestyle  . Physical activity:    Days per week: 0 days    Minutes per session: 0 min  . Stress: Not at all  Relationships  . Social connections:    Talks on phone: More than three times a week    Gets together: More than three times a week    Attends religious service: 1 to 4 times per year    Active member  of club or organization: No    Attends meetings of clubs or organizations: Never    Relationship status: Married  . Intimate partner violence:    Fear of current or ex partner: No    Emotionally abused: No    Physically abused: No    Forced sexual activity: No  Other Topics Concern  . Not on file  Social History Narrative   Married 1985. No kids. 4 small dogs.       Works in Financial trader, residential      Hobbies: work on cars, Haematologist, exercise as able    FAMILY HISTORY: Family History  Problem Relation Age of Onset  . Lung cancer Mother        smoker  . Brain cancer Mother        metastasis  . AAA (abdominal aortic aneurysm) Father        smoker    ALLERGIES:  is allergic to ciprofloxacin.  MEDICATIONS:  Scheduled Meds: . DOXOrubicin/vinCRIStine/etoposide CHEMO IV infusion for Inpatient CI   Intravenous Once  . enoxaparin (LOVENOX) injection  150 mg Subcutaneous Q24H  . furosemide  20 mg Oral Daily  . linagliptin  5 mg Oral Daily  . metFORMIN  1,000 mg Oral BID WC  . predniSONE  30 mg Oral BID WC   Continuous Infusions: . sodium chloride 10 mL/hr at 05/23/18 0757   PRN Meds:.acetaminophen, alteplase, Cold Pack, heparin lock flush, heparin lock flush, Hot Pack, lidocaine-prilocaine, senna-docusate, sodium chloride flush, sodium chloride flush  REVIEW OF SYSTEMS:    A 10+ POINT REVIEW OF SYSTEMS WAS OBTAINED including neurology, dermatology, psychiatry, cardiac, respiratory, lymph, extremities, GI, GU, Musculoskeletal, constitutional, breasts, reproductive, HEENT.  All pertinent positives are noted in the HPI.  All others are negative.   LABORATORY DATA:  I have reviewed the data as listed  . CBC Latest Ref Rng & Units 05/24/2018 05/23/2018 05/22/2018  WBC 4.0 - 10.5 K/uL 7.9 8.6 7.3  Hemoglobin 13.0 - 17.0 g/dL 10.0(L) 10.4(L) 10.8(L)  Hematocrit 39.0 - 52.0 % 30.9(L) 32.3(L) 33.6(L)  Platelets 150 - 400 K/uL 446(H) 442(H) 443(H)    . CMP Latest  Ref Rng & Units 05/24/2018 05/23/2018 05/22/2018  Glucose 70 - 99 mg/dL 313(H) 277(H) 133(H)  BUN 8 - 23 mg/dL 16 14 11   Creatinine 0.61 - 1.24 mg/dL 0.75 0.80 0.76  Sodium 135 - 145 mmol/L 140 139 139  Potassium 3.5 - 5.1 mmol/L 4.5 4.4 3.9  Chloride 98 - 111 mmol/L 107 107 105  CO2 22 - 32 mmol/L 23 22 24   Calcium 8.9 - 10.3 mg/dL 9.2 9.6 10.1  Total Protein 6.5 - 8.1 g/dL - - 6.8  Total Bilirubin 0.3 - 1.2 mg/dL - - 0.4  Alkaline Phos 38 - 126 U/L - - 48  AST 15 - 41 U/L - - 25  ALT 0 - 44 U/L - - 27     RADIOGRAPHIC STUDIES: I have personally reviewed the radiological images as listed and agreed with the findings in the report. Dg Fluoro Guide Lumbar Puncture  Result Date: 05/23/2018 CLINICAL DATA:  Lymphoma. EXAM: FLUOROSCOPICALLY GUIDED LUMBAR PUNCTURE FOR INTRATHECAL CHEMOTHERAPY TECHNIQUE: Informed consent was obtained from the patient prior to the procedure, including potential complications of headache, allergy, and pain. A 'time out' was performed. With the patient prone, the lower back was prepped with Betadine. 1% Lidocaine was used for local anesthesia. Lumbar puncture was performed at the L3-4 using a 28 gauge needle with return of clear CSF. methotrexate was injected into the subarachnoid space, as prepared by pharmacy. The patient tolerated the procedure well without apparent complication. FLUOROSCOPY TIME:  5 minutes and 0 seconds IMPRESSION: Intrathecal injection of chemotherapy without complication Electronically Signed   By: Abigail Miyamoto M.D.   On: 05/23/2018 16:29   Dg Fluoro Guide Lumbar Puncture  Result Date: 05/02/2018 CLINICAL DATA:  Burkitt lymphoma.  Intrathecal chemotherapy EXAM: FLUOROSCOPICALLY GUIDED LUMBAR PUNCTURE FOR INTRATHECAL CHEMOTHERAPY FLUOROSCOPY TIME:  Fluoroscopy Time:  0 minutes 24 seconds PROCEDURE: Informed consent was obtained from the patient prior to the procedure, including potential complications of headache, allergy, and pain. With the patient  prone, the lower back was prepped with Betadine. 1% Lidocaine was used for local anesthesia. Lumbar puncture was performed at the L2-3 level using a 20 gauge needle with return of clear. 0 ml of CSF were obtained for laboratory studies. The patient tolerated the procedure well and there were no apparent complications. Intrathecal chemotherapy of methotrexate and steroid prepared by pharmacy injected into the subarachnoid space. IMPRESSION: Successful lumbar puncture with intrathecal chemotherapy. Electronically Signed   By: Franchot Gallo M.D.   On: 05/02/2018 12:45    ASSESSMENT & PLAN:  62 y.o. male with  1. 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 No BM or CNS involvement.   04/18/18 PET/CT revealed Continued improvement in right colonic wall thickening and surrounding bulky masses consistent with treated lymphoma. No hypermetabolic activity to suggest residual tumor. There is some hypermetabolic activity associated with the sigmoid colon which demonstrates mild wall thickening, surrounding inflammation and underlying diverticulosis, suggesting mild diverticulitis. Suspected treatment related changes throughout the bone marrow. There is mildly increased activity within the clivus without clear corresponding finding on the CT images. Attention on follow-up recommended. Nonobstructing right renal calculi. 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   2. RLE DVT and b/l PE  3.  s/p GI bleeding from lymphoma involving bowels-- resolved at this time -- will need to monitor with anticoagulation.  PLAN: -Rituxan and Neulasta have been scheduled on 05/19/2018 as outpatient. -IR request for fluoroscopic guided lumbar puncture for intrathecal methotrexate requested for D2 on 05/23/2018. -Daily labs -Does not need allopurinol at this time for tumor lysis syndrome prophylaxis. -Will repeat PET/CT after C4 -After C6 and the subsequent PET/CT, I will recommend pt to discuss possibility of RT  with Rad Onc -chemotherapy orders reviewed, signed and plan discussed with oncology unit staff. -The pt has no prohibitive toxicities from continuing C4 EPOCH-R at this time.   -Continue with SCD's and walking around intermittently -Completed 3rd dose of IT MTX yesterday 05/23/18 without any acute issues. -Discussed pt labwork today, 05/24/18; blood counts and chemistries are stable -Will complete PET/CT after this cycle completes -Begin Lovenox tonight at 6pm -The pt has no prohibitive toxicities from continuing C4 EPOCH-R at this time.     4. DM2 with some increased hyperglycemia with steroids Plan -monitor  -continue Oral hypoglycemics and carb consistent diet at this time  DISPO - likely discharge on Friday after completion of planned chemotherapy.   The total time spent in the appt was 25 minutes and more than 50% was on counseling and direct patient cares.    Sullivan Lone MD MS AAHIVMS [facility] Hematology/Oncology Physician [facility]  (Office):       [phone] (Work cell):  [phone] (Fax):           [phone]  05/24/2018 3:34 PM  I, Baldwin Jamaica, am acting as a scribe for [name]  .I have reviewed the above documentation for accuracy and completeness, and I agree with the above. Sullivan Lone MD MS

## 2018-05-24 NOTE — Telephone Encounter (Signed)
Oncology was prescribing this to prevent tumor lysis- they should refill if needed- we can deny it

## 2018-05-25 LAB — CBC
HCT: 31.9 % — ABNORMAL LOW (ref 39.0–52.0)
Hemoglobin: 10.5 g/dL — ABNORMAL LOW (ref 13.0–17.0)
MCH: 30.2 pg (ref 26.0–34.0)
MCHC: 32.9 g/dL (ref 30.0–36.0)
MCV: 91.7 fL (ref 78.0–100.0)
PLATELETS: 466 10*3/uL — AB (ref 150–400)
RBC: 3.48 MIL/uL — ABNORMAL LOW (ref 4.22–5.81)
RDW: 20.3 % — AB (ref 11.5–15.5)
WBC: 8.4 10*3/uL (ref 4.0–10.5)

## 2018-05-25 LAB — BASIC METABOLIC PANEL
Anion gap: 11 (ref 5–15)
BUN: 19 mg/dL (ref 8–23)
CALCIUM: 9.7 mg/dL (ref 8.9–10.3)
CO2: 25 mmol/L (ref 22–32)
Chloride: 104 mmol/L (ref 98–111)
Creatinine, Ser: 0.85 mg/dL (ref 0.61–1.24)
GFR calc Af Amer: >60 mL/min (ref >60)
GFR calc non Af Amer: >60 mL/min (ref >60)
GLUCOSE: 283 mg/dL — AB (ref 70–99)
Potassium: 4.3 mmol/L (ref 3.5–5.1)
Sodium: 140 mmol/L (ref 135–145)

## 2018-05-25 MED ORDER — SODIUM CHLORIDE 0.9 % IV SOLN
Freq: Once | INTRAVENOUS | Status: AC
  Administered 2018-05-25: 18 mg via INTRAVENOUS
  Filled 2018-05-25: qty 4

## 2018-05-25 MED ORDER — VINCRISTINE SULFATE CHEMO INJECTION 1 MG/ML
Freq: Once | INTRAVENOUS | Status: AC
  Administered 2018-05-25: 12:00:00 via INTRAVENOUS
  Filled 2018-05-25: qty 12

## 2018-05-25 NOTE — Progress Notes (Signed)
HEMATOLOGY/ONCOLOGY INPATIENT PROGRESS NOTE  Date of Service: 05/25/2018  Inpatient Attending: .[name], MD   SUBJECTIVE:   Mark Frey reports that he is doing well overall and has had no new concerns.   The pt reports that he continues to tolerate EPOCH uneventfully. He has been urinating frequently while on Lasix and is continuing Lovenox without complaint.   The pt continues eating well, staying hydrated and endorses stable energy levels.   Lab results today (05/25/18) of CBC and BMP is as follows: all values are WNL except for RBC at 3.48, HGB at 10.5, HCT at 31.9, RDW at 20.3, PLT at 466k, Glucose at 283.  On review of systems, pt reports moving his bowels well, good energy levels, eating well, and denies headaches, abdominal pains, blood in the stools, back pains, and any other symptoms.    OBJECTIVE:  NAD  PHYSICAL EXAMINATION: . Vitals:   05/24/18 1505 05/24/18 2257 05/25/18 0555 05/25/18 1422  BP: 128/80 126/78 124/76 (!) 134/97  Pulse: (!) 103 84 88 (!) 113  Resp: 18 16 17 18   Temp: 97.9 F (36.6 C) 98.3 F (36.8 C) 97.8 F (36.6 C) 97.6 F (36.4 C)  TempSrc: Oral Oral Oral Oral  SpO2: 100% 100% 100% 97%  Weight:      Height:       Filed Weights   05/23/18 0604  Weight: 230 lb 1.6 oz (104.4 kg)   .Body mass index is 33.98 kg/m.  GENERAL:alert, in no acute distress and comfortable SKIN: no acute rashes, no significant lesions EYES: conjunctiva are pink and non-injected, sclera anicteric OROPHARYNX: MMM, no exudates, no oropharyngeal erythema or ulceration NECK: supple, no JVD LYMPH:  no palpable lymphadenopathy in the cervical, axillary or inguinal regions LUNGS: clear to auscultation b/l with normal respiratory effort HEART: regular rate & rhythm ABDOMEN:  normoactive bowel sounds , non tender, not distended. No palpable hepatosplenomegaly.  Extremity: 1+ right pedal edema, trace left pedal edema  PSYCH: alert & oriented x 3 with  fluent speech NEURO: no focal motor/sensory deficits   MEDICAL HISTORY:  Past Medical History:  Diagnosis Date  . ALLERGIC RHINITIS   . Diabetes mellitus   . Hyperlipidemia     SURGICAL HISTORY: Past Surgical History:  Procedure Laterality Date  . BIOPSY  03/15/2018   Procedure: BIOPSY;  Surgeon: Milus Banister, MD;  Location: [facility];  Service: Endoscopy;;  . CLEFT PALATE REPAIR    . COLONOSCOPY N/A 03/15/2018   Procedure: COLONOSCOPY;  Surgeon: Milus Banister, MD;  Location: [facility];  Service: Endoscopy;  Laterality: N/A;  . deviated septum repair     slight improvement  . ESOPHAGOGASTRODUODENOSCOPY N/A 03/15/2018   Procedure: ESOPHAGOGASTRODUODENOSCOPY (EGD);  Surgeon: Milus Banister, MD;  Location: [name];  Service: Endoscopy;  Laterality: N/A;  . IR IMAGING GUIDED PORT INSERTION  03/17/2018  . TONSILLECTOMY      SOCIAL HISTORY: Social History   Socioeconomic History  . Marital status: Married    Spouse name: lisa  . Number of children: 0  . Years of education: Not on file  . Highest education level: Not on file  Occupational History  . Not on file  Social Needs  . Financial resource strain: Not hard at all  . Food insecurity:    Worry: Never true    Inability: Never true  . Transportation needs:    Medical: No    Non-medical: No  Tobacco Use  . Smoking status: Never Smoker  .  Smokeless tobacco: Never Used  Substance and Sexual Activity  . Alcohol use: Yes    Comment: occasional  . Drug use: No  . Sexual activity: Yes  Lifestyle  . Physical activity:    Days per week: 0 days    Minutes per session: 0 min  . Stress: Not at all  Relationships  . Social connections:    Talks on phone: More than three times a week    Gets together: More than three times a week    Attends religious service: 1 to 4 times per year    Active member of club or organization: No    Attends meetings of clubs or organizations: Never    Relationship status:  Married  . Intimate partner violence:    Fear of current or ex partner: No    Emotionally abused: No    Physically abused: No    Forced sexual activity: No  Other Topics Concern  . Not on file  Social History Narrative   Married 1985. No kids. 4 small dogs.       Works in Financial trader, residential      Hobbies: work on cars, Haematologist, exercise as able    FAMILY HISTORY: Family History  Problem Relation Age of Onset  . Lung cancer Mother        smoker  . Brain cancer Mother        metastasis  . AAA (abdominal aortic aneurysm) Father        smoker    ALLERGIES:  is allergic to ciprofloxacin.  MEDICATIONS:  Scheduled Meds: . DOXOrubicin/vinCRIStine/etoposide CHEMO IV infusion for Inpatient CI   Intravenous Once  . enoxaparin (LOVENOX) injection  150 mg Subcutaneous Q24H  . furosemide  20 mg Oral Daily  . linagliptin  5 mg Oral Daily  . metFORMIN  1,000 mg Oral BID WC  . predniSONE  30 mg Oral BID WC   Continuous Infusions: . sodium chloride 10 mL/hr at 05/23/18 0757   PRN Meds:.acetaminophen, alteplase, Cold Pack, heparin lock flush, heparin lock flush, Hot Pack, lidocaine-prilocaine, senna-docusate, sodium chloride flush, sodium chloride flush  REVIEW OF SYSTEMS:    A 10+ POINT REVIEW OF SYSTEMS WAS OBTAINED including neurology, dermatology, psychiatry, cardiac, respiratory, lymph, extremities, GI, GU, Musculoskeletal, constitutional, breasts, reproductive, HEENT.  All pertinent positives are noted in the HPI.  All others are negative.   LABORATORY DATA:  I have reviewed the data as listed  . CBC Latest Ref Rng & Units 05/26/2018 05/25/2018 05/24/2018  WBC 4.0 - 10.5 K/uL 4.8 8.4 7.9  Hemoglobin 13.0 - 17.0 g/dL 10.0(L) 10.5(L) 10.0(L)  Hematocrit 39.0 - 52.0 % 29.8(L) 31.9(L) 30.9(L)  Platelets 150 - 400 K/uL 456(H) 466(H) 446(H)    . CMP Latest Ref Rng & Units 05/26/2018 05/25/2018 05/24/2018  Glucose 70 - 99 mg/dL 238(H) 283(H) 313(H)  BUN 8 - 23  mg/dL 21 19 16   Creatinine 0.61 - 1.24 mg/dL 0.70 0.85 0.75  Sodium 135 - 145 mmol/L 138 140 140  Potassium 3.5 - 5.1 mmol/L 3.8 4.3 4.5  Chloride 98 - 111 mmol/L 101 104 107  CO2 22 - 32 mmol/L 25 25 23   Calcium 8.9 - 10.3 mg/dL 9.6 9.7 9.2  Total Protein 6.5 - 8.1 g/dL - - -  Total Bilirubin 0.3 - 1.2 mg/dL - - -  Alkaline Phos 38 - 126 U/L - - -  AST 15 - 41 U/L - - -  ALT 0 - 44 U/L - - -  RADIOGRAPHIC STUDIES: I have personally reviewed the radiological images as listed and agreed with the findings in the report. Dg Fluoro Guide Lumbar Puncture  Result Date: 05/23/2018 CLINICAL DATA:  Lymphoma. EXAM: FLUOROSCOPICALLY GUIDED LUMBAR PUNCTURE FOR INTRATHECAL CHEMOTHERAPY TECHNIQUE: Informed consent was obtained from the patient prior to the procedure, including potential complications of headache, allergy, and pain. A 'time out' was performed. With the patient prone, the lower back was prepped with Betadine. 1% Lidocaine was used for local anesthesia. Lumbar puncture was performed at the L3-4 using a 28 gauge needle with return of clear CSF. methotrexate was injected into the subarachnoid space, as prepared by pharmacy. The patient tolerated the procedure well without apparent complication. FLUOROSCOPY TIME:  5 minutes and 0 seconds IMPRESSION: Intrathecal injection of chemotherapy without complication Electronically Signed   By: Abigail Miyamoto M.D.   On: 05/23/2018 16:29   Dg Fluoro Guide Lumbar Puncture  Result Date: 05/02/2018 CLINICAL DATA:  Burkitt lymphoma.  Intrathecal chemotherapy EXAM: FLUOROSCOPICALLY GUIDED LUMBAR PUNCTURE FOR INTRATHECAL CHEMOTHERAPY FLUOROSCOPY TIME:  Fluoroscopy Time:  0 minutes 24 seconds PROCEDURE: Informed consent was obtained from the patient prior to the procedure, including potential complications of headache, allergy, and pain. With the patient prone, the lower back was prepped with Betadine. 1% Lidocaine was used for local anesthesia. Lumbar puncture  was performed at the L2-3 level using a 20 gauge needle with return of clear. 0 ml of CSF were obtained for laboratory studies. The patient tolerated the procedure well and there were no apparent complications. Intrathecal chemotherapy of methotrexate and steroid prepared by pharmacy injected into the subarachnoid space. IMPRESSION: Successful lumbar puncture with intrathecal chemotherapy. Electronically Signed   By: Franchot Gallo M.D.   On: 05/02/2018 12:45    ASSESSMENT & PLAN:  62 y.o. male with  1. 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 No BM or CNS involvement.   04/18/18 PET/CT revealed Continued improvement in right colonic wall thickening and surrounding bulky masses consistent with treated lymphoma. No hypermetabolic activity to suggest residual tumor. There is some hypermetabolic activity associated with the sigmoid colon which demonstrates mild wall thickening, surrounding inflammation and underlying diverticulosis, suggesting mild diverticulitis. Suspected treatment related changes throughout the bone marrow. There is mildly increased activity within the clivus without clear corresponding finding on the CT images. Attention on follow-up recommended. Nonobstructing right renal calculi. 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   2. RLE DVT and b/l PE  3. s/p GI bleeding from lymphoma involving bowels-- resolved at this time -- will need to monitor with  anticoagulation.  PLAN: -Rituxan and Neulasta have been scheduled on 05/19/2018 as outpatient. -s/p fluoroscopic guided lumbar puncture for intrathecal methotrexate requested for D2 on 05/23/2018. -Discussed pt labwork today, 05/25/18; blood counts and chemistries are stable -The pt has no prohibitive toxicities from continuing C4D4 EPOCH-R at this time.   -Continue eating well and staying well hydrated -Continue Lovenox 198m [place] q6pm -Advised that pt ambulate periodically -Will repeat PET/CT after C4 (order placed to be scheduled) -After C6 and the  subsequent PET/CT, I will recommend pt to discuss possibility of RT with Rad Onc -Continue with SCD's and walking around intermittently -Will complete PET/CT after this cycle completes  4. DM2 with some increased hyperglycemia with steroids Plan -monitor  -continue Oral hypoglycemics and carb consistent diet at this time  DISPO - likely discharge on Friday after completion of planned chemotherapy.   The total time spent in the appt was 25 minutes and more than 50% was on counseling and direct patient cares.    Sullivan Lone MD MS AAHIVMS [facility] Hematology/Oncology Physician [facility]  (Office):       [phone] (Work cell):  [phone] (Fax):           [phone]  05/25/2018 4:16 PM  I, Baldwin Jamaica, am acting as a scribe for [name]  .I have reviewed the above documentation for accuracy and completeness, and I agree with the above. Sullivan Lone MD MS

## 2018-05-26 LAB — BASIC METABOLIC PANEL
Anion gap: 12 (ref 5–15)
BUN: 21 mg/dL (ref 8–23)
CHLORIDE: 101 mmol/L (ref 98–111)
CO2: 25 mmol/L (ref 22–32)
CREATININE: 0.7 mg/dL (ref 0.61–1.24)
Calcium: 9.6 mg/dL (ref 8.9–10.3)
GFR calc Af Amer: >60 mL/min (ref >60)
GFR calc non Af Amer: >60 mL/min (ref >60)
Glucose, Bld: 238 mg/dL — ABNORMAL HIGH (ref 70–99)
Potassium: 3.8 mmol/L (ref 3.5–5.1)
Sodium: 138 mmol/L (ref 135–145)

## 2018-05-26 LAB — CBC
HEMATOCRIT: 29.8 % — AB (ref 39.0–52.0)
HEMOGLOBIN: 10 g/dL — AB (ref 13.0–17.0)
MCH: 30.2 pg (ref 26.0–34.0)
MCHC: 33.6 g/dL (ref 30.0–36.0)
MCV: 90 fL (ref 78.0–100.0)
Platelets: 456 10*3/uL — ABNORMAL HIGH (ref 150–400)
RBC: 3.31 MIL/uL — ABNORMAL LOW (ref 4.22–5.81)
RDW: 19.2 % — ABNORMAL HIGH (ref 11.5–15.5)
WBC: 4.8 10*3/uL (ref 4.0–10.5)

## 2018-05-26 MED ORDER — SODIUM CHLORIDE 0.9 % IV SOLN
Freq: Once | INTRAVENOUS | Status: AC
  Administered 2018-05-26: 8 mg via INTRAVENOUS
  Filled 2018-05-26: qty 8

## 2018-05-26 MED ORDER — SODIUM CHLORIDE 0.9 % IV SOLN
750.0000 mg/m2 | Freq: Once | INTRAVENOUS | Status: AC
  Administered 2018-05-26: 1800 mg via INTRAVENOUS
  Filled 2018-05-26: qty 50

## 2018-05-26 NOTE — Progress Notes (Signed)
97 PT left to  go home without waiting for his DC papers.. I  informed Mark Frey earlier in the am that I had spoken with Mark Frey re DC papers.Writer told Pt that Mark Frey was seeing pt's in the clinic and he would  be here to place DC orders as soon as he could. Mark Frey refused to wait . He and his wife departed

## 2018-05-26 NOTE — Discharge Summary (Signed)
[facility]  [phone]) [phone] [phone]) [phone]    Physician Discharge Summary     Patient ID: Mark Frey MRN: [id number] DOB/AGE: [birth year] 62 y.o.  Admit date: 05/22/2018 Discharge date: 05/26/2018  Primary Care Physician:  [name], MD   Discharge Diagnoses:    Present on Admission: . Burkitt lymphoma, lymph nodes of multiple sites [facility] [address])   Discharge Medications:  Allergies as of 05/26/2018      Reactions   Ciprofloxacin Other (See Comments)   Leg tingling      Medication List    STOP taking these medications   acetaminophen 325 MG tablet Commonly known as:  TYLENOL   acetaminophen 500 MG tablet Commonly known as:  TYLENOL     TAKE these medications   blood glucose meter kit and supplies Dispense based on patient and insurance preference. Use daily as directed. (E11.9).   Choline Fenofibrate 135 MG capsule TAKE 1 CAPSULE DAILY What changed:  when to take this   dexamethasone 4 MG tablet Commonly known as:  DECADRON 24m (2 tabs) oral with breakfast for 2 days after each cycle of chemotherapy What changed:    how much to take  how to take this  when to take this  additional instructions   enoxaparin 150 MG/ML injection Commonly known as:  LOVENOX Inject 1 mL (150 mg total) into the skin daily. What changed:  when to take this   furosemide 20 MG tablet Commonly known as:  LASIX Take 1 tablet (20 mg total) by mouth daily. Hold after 10-15 days if leg swelling resolved or if >10lbs weight loss.   glucose blood test strip Use to check blood sugar daily   JANUMET 50-1000 MG tablet Generic drug:  sitaGLIPtin-metformin TAKE 1 TABLET TWICE DAILY  WITH MEALS   lidocaine-prilocaine cream Commonly known as:  EMLA Apply 1 application topically as needed. What changed:    when to take this  reasons to take this   loperamide 2 MG tablet Commonly known as:  IMODIUM A-D Take 2 mg by mouth 4  (four) times daily as needed for diarrhea or loose stools.   ondansetron 8 MG tablet Commonly known as:  ZOFRAN Take 1 tablet (8 mg total) by mouth every 8 (eight) hours as needed for nausea or vomiting.   senna-docusate 8.6-50 MG tablet Commonly known as:  Senokot-S Take 2 tablets by mouth at bedtime. What changed:    when to take this  reasons to take this        Disposition and Follow-up: . Discharge Instructions    Call MD for:  difficulty breathing, headache or visual disturbances   Complete by:  As directed    Call MD for:  extreme fatigue   Complete by:  As directed    Call MD for:  hives   Complete by:  As directed    Call MD for:  persistant dizziness or light-headedness   Complete by:  As directed    Call MD for:  persistant nausea and vomiting   Complete by:  As directed    Call MD for:  temperature >100.4   Complete by:  As directed    Diet - low sodium heart healthy   Complete by:  As directed    Discharge instructions   Complete by:  As directed    F/u for neulasta and Rituxan on Monday as per appointment F/u in clinic with Dr KIrene Limboon 06/06/2018 with labs  Increase activity slowly   Complete by:  As directed        Significant Diagnostic Studies:  Dg Fluoro Guide Lumbar Puncture  Result Date: 05/23/2018 CLINICAL DATA:  Lymphoma. EXAM: FLUOROSCOPICALLY GUIDED LUMBAR PUNCTURE FOR INTRATHECAL CHEMOTHERAPY TECHNIQUE: Informed consent was obtained from the patient prior to the procedure, including potential complications of headache, allergy, and pain. A 'time out' was performed. With the patient prone, the lower back was prepped with Betadine. 1% Lidocaine was used for local anesthesia. Lumbar puncture was performed at the L3-4 using a 28 gauge needle with return of clear CSF. methotrexate was injected into the subarachnoid space, as prepared by pharmacy. The patient tolerated the procedure well without apparent complication. FLUOROSCOPY TIME:  5 minutes and  0 seconds IMPRESSION: Intrathecal injection of chemotherapy without complication Electronically Signed   By: Abigail Miyamoto M.D.   On: 05/23/2018 16:29   Dg Fluoro Guide Lumbar Puncture  Result Date: 05/02/2018 CLINICAL DATA:  Burkitt lymphoma.  Intrathecal chemotherapy EXAM: FLUOROSCOPICALLY GUIDED LUMBAR PUNCTURE FOR INTRATHECAL CHEMOTHERAPY FLUOROSCOPY TIME:  Fluoroscopy Time:  0 minutes 24 seconds PROCEDURE: Informed consent was obtained from the patient prior to the procedure, including potential complications of headache, allergy, and pain. With the patient prone, the lower back was prepped with Betadine. 1% Lidocaine was used for local anesthesia. Lumbar puncture was performed at the L2-3 level using a 20 gauge needle with return of clear. 0 ml of CSF were obtained for laboratory studies. The patient tolerated the procedure well and there were no apparent complications. Intrathecal chemotherapy of methotrexate and steroid prepared by pharmacy injected into the subarachnoid space. IMPRESSION: Successful lumbar puncture with intrathecal chemotherapy. Electronically Signed   By: Franchot Gallo M.D.   On: 05/02/2018 12:45    Discharge Laboratory Values: . CBC Latest Ref Rng & Units 05/26/2018 05/25/2018 05/24/2018  WBC 4.0 - 10.5 K/uL 4.8 8.4 7.9  Hemoglobin 13.0 - 17.0 g/dL 10.0(L) 10.5(L) 10.0(L)  Hematocrit 39.0 - 52.0 % 29.8(L) 31.9(L) 30.9(L)  Platelets 150 - 400 K/uL 456(H) 466(H) 446(H)   . CMP Latest Ref Rng & Units 05/26/2018 05/25/2018 05/24/2018  Glucose 70 - 99 mg/dL 238(H) 283(H) 313(H)  BUN 8 - 23 mg/dL _0 Creatinine 0.61 - 1.24 mg/dL 0.70 0.85 0.75  Sodium 135 - 145 mmol/L 138 140 140  Potassium 3.5 - 5.1 mmol/L 3.8 4.3 4.5  Chloride 98 - 111 mmol/L 101 104 107  CO2 22 - 32 mmol/L _1 Calcium 8.9 - 10.3 mg/dL 9.6 9.7 9.2  Total Protein 6.5 - 8.1 g/dL - - -  Total Bilirubin 0.3 - 1.2 mg/dL - - -  Alkaline Phos 38 - 126 U/L - - -  AST 15 - 41 U/L - - -  ALT 0 - 44  U/L - - -     Brief H and P: For complete details please refer to admission H and P, but in brief, Mark Frey a wonderful 62 y.o.malewho is admitted today for C4 EPOCH-R and 3rd dose of prophylactic IT Methotrexate treatment of his Burkitt Lymphoma.  The pt reports thatheis doing verywell overall and has no acute issues since his last clinic visit.  Issues during this hospitalization  1. 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 No BM or CNS involvement.   04/18/18 PET/CT revealed Continued improvement in right colonic wall thickening and surrounding bulky masses consistent with treated lymphoma. No hypermetabolic activity to suggest residual tumor. There is some hypermetabolic activity associated with the sigmoid colon which demonstrates mild wall thickening, surrounding inflammation and underlying diverticulosis, suggesting mild diverticulitis. Suspected treatment related changes throughout the bone marrow. There is mildly increased activity within the clivus without clear corresponding finding on the CT images. Attention on follow-up recommended. Nonobstructing right renal calculi. 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   2. RLE DVT and b/l PE  3.s/pGI bleeding from lymphoma involving bowels-- resolved at this time -- will need to monitor with anticoagulation. hgb stable.  PLAN: Patient tolerated the C4 of EPOCH well without any prohibitive  toxicities -Rituxan and Neulasta have been scheduled on 05/29/2018 as outpatient. -s/p fluoroscopic guided lumbar puncture for intrathecal methotrexate dose 3 on 05/23/2018. -Continue Lovenox 132m [place] q6pm on discharge -Will repeat PET/CT after C4 and prior to C5 of treatment (order placed to be scheduled)  4. DM2 with some increased hyperglycemia with steroids Plan -continue Oral hypoglycemics -consistent carb diet.  Physical Exam at Discharge: BP (!) 154/97 (BP Location: Right Arm)   Pulse 89   Temp 98.2 F (36.8 C) (Oral)   Resp 20   Ht _0  (1.753 m)   Wt 230 lb 1.6 oz (104.4 kg)   SpO2 100%   BMI 33.98 kg/m  Patient left prior to physician examination today  Hospital Course:  Active Problems:   Burkitt lymphoma, lymph nodes of multiple sites ([facility]   Diet:  Diabetic consistent Carb  Activity:  Infection precautions  Condition at Discharge:   Stable per RN. Patient left prior to physician visit today  Signed: Dr. GSullivan LoneMD [phone] 05/26/2018, 1:12 PM

## 2018-05-27 ENCOUNTER — Other Ambulatory Visit: Payer: Self-pay | Admitting: Hematology

## 2018-05-27 DIAGNOSIS — R609 Edema, unspecified: Secondary | ICD-10-CM

## 2018-05-29 ENCOUNTER — Other Ambulatory Visit: Payer: Self-pay | Admitting: Hematology

## 2018-05-29 ENCOUNTER — Inpatient Hospital Stay: Payer: BLUE CROSS/BLUE SHIELD

## 2018-05-29 ENCOUNTER — Telehealth: Payer: Self-pay

## 2018-05-29 VITALS — BP 135/77 | HR 113 | Temp 98.7°F | Resp 17

## 2018-05-29 DIAGNOSIS — E785 Hyperlipidemia, unspecified: Secondary | ICD-10-CM | POA: Diagnosis not present

## 2018-05-29 DIAGNOSIS — Z7901 Long term (current) use of anticoagulants: Secondary | ICD-10-CM | POA: Diagnosis not present

## 2018-05-29 DIAGNOSIS — Z7189 Other specified counseling: Secondary | ICD-10-CM

## 2018-05-29 DIAGNOSIS — C8378 Burkitt lymphoma, lymph nodes of multiple sites: Secondary | ICD-10-CM

## 2018-05-29 DIAGNOSIS — Z86711 Personal history of pulmonary embolism: Secondary | ICD-10-CM | POA: Diagnosis not present

## 2018-05-29 DIAGNOSIS — R609 Edema, unspecified: Secondary | ICD-10-CM

## 2018-05-29 DIAGNOSIS — Z95828 Presence of other vascular implants and grafts: Secondary | ICD-10-CM

## 2018-05-29 DIAGNOSIS — Z86718 Personal history of other venous thrombosis and embolism: Secondary | ICD-10-CM | POA: Diagnosis not present

## 2018-05-29 DIAGNOSIS — Z79899 Other long term (current) drug therapy: Secondary | ICD-10-CM | POA: Diagnosis not present

## 2018-05-29 DIAGNOSIS — E1165 Type 2 diabetes mellitus with hyperglycemia: Secondary | ICD-10-CM | POA: Diagnosis not present

## 2018-05-29 DIAGNOSIS — C8510 Unspecified B-cell lymphoma, unspecified site: Secondary | ICD-10-CM

## 2018-05-29 LAB — CMP (CANCER CENTER ONLY)
ALK PHOS: 54 U/L (ref 38–126)
ALT: 22 U/L (ref 0–44)
ANION GAP: 10 (ref 5–15)
AST: 10 U/L — ABNORMAL LOW (ref 15–41)
Albumin: 3.6 g/dL (ref 3.5–5.0)
BILIRUBIN TOTAL: 0.6 mg/dL (ref 0.3–1.2)
BUN: 16 mg/dL (ref 8–23)
CALCIUM: 10 mg/dL (ref 8.9–10.3)
CO2: 27 mmol/L (ref 22–32)
CREATININE: 0.84 mg/dL (ref 0.61–1.24)
Chloride: 98 mmol/L (ref 98–111)
GFR, Estimated: >60 mL/min (ref >60)
Glucose, Bld: 287 mg/dL — ABNORMAL HIGH (ref 70–99)
Potassium: 4.2 mmol/L (ref 3.5–5.1)
Sodium: 135 mmol/L (ref 135–145)
TOTAL PROTEIN: 6.6 g/dL (ref 6.5–8.1)

## 2018-05-29 LAB — CBC WITH DIFFERENTIAL/PLATELET
BASOS ABS: 0 10*3/uL (ref 0.0–0.1)
BASOS PCT: 0 %
EOS ABS: 0.1 10*3/uL (ref 0.0–0.5)
Eosinophils Relative: 2 %
HEMATOCRIT: 31.6 % — AB (ref 38.4–49.9)
Hemoglobin: 10.3 g/dL — ABNORMAL LOW (ref 13.0–17.1)
Lymphocytes Relative: 12 %
Lymphs Abs: 0.4 10*3/uL — ABNORMAL LOW (ref 0.9–3.3)
MCH: 29.7 pg (ref 27.2–33.4)
MCHC: 32.6 g/dL (ref 32.0–36.0)
MCV: 91.1 fL (ref 79.3–98.0)
Monocytes Absolute: 0 10*3/uL — ABNORMAL LOW (ref 0.1–0.9)
Monocytes Relative: 0 %
NEUTROS ABS: 2.9 10*3/uL (ref 1.5–6.5)
NEUTROS PCT: 86 %
PLATELETS: 226 10*3/uL (ref 140–400)
RBC: 3.47 MIL/uL — ABNORMAL LOW (ref 4.20–5.82)
RDW: 19.5 % — AB (ref 11.0–14.6)
WBC: 3.3 10*3/uL — ABNORMAL LOW (ref 4.0–10.3)

## 2018-05-29 MED ORDER — SODIUM CHLORIDE 0.9 % IV SOLN
375.0000 mg/m2 | Freq: Once | INTRAVENOUS | Status: AC
  Administered 2018-05-29: 900 mg via INTRAVENOUS
  Filled 2018-05-29: qty 90

## 2018-05-29 MED ORDER — ACETAMINOPHEN 325 MG PO TABS
650.0000 mg | ORAL_TABLET | Freq: Once | ORAL | Status: AC
  Administered 2018-05-29: 650 mg via ORAL

## 2018-05-29 MED ORDER — METHYLPREDNISOLONE SODIUM SUCC 125 MG IJ SOLR
INTRAMUSCULAR | Status: AC
  Filled 2018-05-29: qty 2

## 2018-05-29 MED ORDER — SODIUM CHLORIDE 0.9 % IV SOLN
Freq: Once | INTRAVENOUS | Status: AC
  Administered 2018-05-29: 13:00:00 via INTRAVENOUS
  Filled 2018-05-29: qty 250

## 2018-05-29 MED ORDER — PEGFILGRASTIM INJECTION 6 MG/0.6ML ~~LOC~~
6.0000 mg | PREFILLED_SYRINGE | Freq: Once | SUBCUTANEOUS | Status: AC
  Administered 2018-05-29: 6 mg via SUBCUTANEOUS

## 2018-05-29 MED ORDER — METHYLPREDNISOLONE SODIUM SUCC 125 MG IJ SOLR
80.0000 mg | Freq: Once | INTRAMUSCULAR | Status: AC
  Administered 2018-05-29: 80 mg via INTRAVENOUS

## 2018-05-29 MED ORDER — PEGFILGRASTIM INJECTION 6 MG/0.6ML ~~LOC~~
PREFILLED_SYRINGE | SUBCUTANEOUS | Status: AC
  Filled 2018-05-29: qty 0.6

## 2018-05-29 MED ORDER — SODIUM CHLORIDE 0.9% FLUSH
10.0000 mL | INTRAVENOUS | Status: DC | PRN
  Administered 2018-05-29: 10 mL
  Filled 2018-05-29: qty 10

## 2018-05-29 MED ORDER — HEPARIN SOD (PORK) LOCK FLUSH 100 UNIT/ML IV SOLN
500.0000 [IU] | Freq: Once | INTRAVENOUS | Status: AC | PRN
  Administered 2018-05-29: 500 [IU]
  Filled 2018-05-29: qty 5

## 2018-05-29 MED ORDER — ACETAMINOPHEN 325 MG PO TABS
ORAL_TABLET | ORAL | Status: AC
  Filled 2018-05-29: qty 2

## 2018-05-29 MED ORDER — DIPHENHYDRAMINE HCL 25 MG PO CAPS
ORAL_CAPSULE | ORAL | Status: AC
  Filled 2018-05-29: qty 2

## 2018-05-29 MED ORDER — DIPHENHYDRAMINE HCL 25 MG PO CAPS
50.0000 mg | ORAL_CAPSULE | Freq: Once | ORAL | Status: AC
  Administered 2018-05-29: 50 mg via ORAL

## 2018-05-29 NOTE — Patient Instructions (Signed)
[facility] Discharge Instructions for Patients Receiving Chemotherapy  Today you received the following chemotherapy agents Rituxan  To help prevent nausea and vomiting after your treatment, we encourage you to take your nausea medication as directed If you develop nausea and vomiting that is not controlled by your nausea medication, call the clinic.   BELOW ARE SYMPTOMS THAT SHOULD BE REPORTED IMMEDIATELY:  *FEVER GREATER THAN 100.5 F  *CHILLS WITH OR WITHOUT FEVER  NAUSEA AND VOMITING THAT IS NOT CONTROLLED WITH YOUR NAUSEA MEDICATION  *UNUSUAL SHORTNESS OF BREATH  *UNUSUAL BRUISING OR BLEEDING  TENDERNESS IN MOUTH AND THROAT WITH OR WITHOUT PRESENCE OF ULCERS  *URINARY PROBLEMS  *BOWEL PROBLEMS  UNUSUAL RASH Items with * indicate a potential emergency and should be followed up as soon as possible.  Feel free to call the clinic should you have any questions or concerns. The clinic phone number is [phone].  Please show the [facility] at check-in to the Emergency Department and triage nurse.

## 2018-05-29 NOTE — Telephone Encounter (Signed)
Spoke to patient's wife [name] and made her aware patient has a PET scan scheduled for Friday 06/09/18 at 8:00am with arrival at 7:30am. Patient's wife aware that patient should not have anything to eat or drink after midnight. Verbalized understanding.

## 2018-05-29 NOTE — Progress Notes (Signed)
Okay to treat with HR 114-121 per Dr. Myrla Halsted went down to 110.

## 2018-05-30 ENCOUNTER — Other Ambulatory Visit: Payer: Self-pay | Admitting: Hematology

## 2018-05-30 DIAGNOSIS — R609 Edema, unspecified: Secondary | ICD-10-CM

## 2018-06-05 ENCOUNTER — Other Ambulatory Visit: Payer: Self-pay

## 2018-06-05 DIAGNOSIS — C8378 Burkitt lymphoma, lymph nodes of multiple sites: Secondary | ICD-10-CM

## 2018-06-05 NOTE — Progress Notes (Signed)
HEMATOLOGY/ONCOLOGY CLINIC NOTE  Date of Service: 06/06/18    Patient Care Team: [name], MD as PCP - General (Family Medicine)  CHIEF COMPLAINTS/PURPOSE OF CONSULTATION:  F/u for continued Mx of Burkitts lymphoma  HISTORY OF PRESENTING ILLNESS:   Mark Frey is a wonderful 62 y.o. male who has been referred to [place] by my colleague Dr. 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 He is accompanied today by his wife. The pt reports that he is doing well overall.   The pt started R-EPOCH every 3 weeks with neulasta support on 03/17/18. He presented to the ED with right leg swelling, found to be a DVT and bilateral PE, which resulted in the incidental finding of a lower abdomen tumor measuring 18.7 x 18.5 x 17.8 cm. He denies any abdominal pains or changes in his bowel habits prior to this finding. He first noted blood in the stools 4-5 days prior to appearing to the ED.  The pt reports that he has been taking 136m Lovenox injections once each day, except for the days in which he had blood in the stools. He hasn't had blood in his stools for the last 7 days.   He has had GI bleed related to bowel involvement with his lymphoma - this is now resolving and his hgb is stabilizing.  He notes that his scrotum and leg swelling has decreased. He denies having CP or SOB at any point from his PE.   Most recent lab results (04/06/18) of CBC w/diff, CMP, Reticulocytes  is as follows: all values are WNL except for WBC at 15.3k, RBC at 3.65, HGB at 10.5, HCT at 31.4, RDW at 17.8, PLT at 640k, ANC at 12.7k, Monocytes abs at 1.6k, Glucose at 162, Total Protein at 6.2, Albumin at 3.1, Total bilirubin at <0.2, Retic ct pct at 2.5%. Uric acid 04/06/18 was low at 3.0 LDH 04/06/18 elevated at 251  On review of systems, pt reports remaining right leg swelling, blood in the stools 7 days ago, two  bowel movements each day, left leg swelling, improved scrotal swelling, eating well, and denies problems with his port, abdominal pains, constipation, diarrhea, jaw pain, pain along the spine, problems passing urine, and any other symptoms.  Interval History:   Mark HEDEENreturns today for management, evaluation, and treatment of his Burkitt Lymphoma prior to C5 of EPOCH-R. I last saw the pt on 05/26/18 with discharge from C4 inpatient. He is accompanied today by his wife. The pt reports that he is doing well overall.   The pt reports that he has not developed any new concerns in the interim. He adds that his ankle swelling has continued to resolve and he stopped taking Lasix, though has been weighing himself each day.   Lab results today (06/06/18) of CBC w/diff, CMP is as follows: all values are WNL except for WBC at 21.3k, RBC at 3.14, HGB at 9.5, HCT at 29.2, RDW at 21.2, ANC at 18.4k, Lymphs abs at 700, Monocytes abs at 2.2k, Glucose at 295, Albumin at 3.4, Total Bilirubin at <0.2.  On review of systems, pt reports reduced ankle swelling,fluid loss, and denies fevers, chills, diarrhea, coughs, colds, concerns for infection, leg swelling, new abdominal pain, problems swallowing, back pains, headaches, mouth sores, and any other symptoms.   MEDICAL HISTORY:  Past Medical History:  Diagnosis Date  . ALLERGIC RHINITIS   .  Diabetes mellitus   . Hyperlipidemia     SURGICAL HISTORY: Past Surgical History:  Procedure Laterality Date  . BIOPSY  03/15/2018   Procedure: BIOPSY;  Surgeon: Milus Banister, MD;  Location: [facility];  Service: Endoscopy;;  . CLEFT PALATE REPAIR    . COLONOSCOPY N/A 03/15/2018   Procedure: COLONOSCOPY;  Surgeon: Milus Banister, MD;  Location: [facility];  Service: Endoscopy;  Laterality: N/A;  . deviated septum repair     slight improvement  . ESOPHAGOGASTRODUODENOSCOPY N/A 03/15/2018   Procedure: ESOPHAGOGASTRODUODENOSCOPY (EGD);  Surgeon: Milus Banister,  MD;  Location: [name];  Service: Endoscopy;  Laterality: N/A;  . IR IMAGING GUIDED PORT INSERTION  03/17/2018  . TONSILLECTOMY      SOCIAL HISTORY: Social History   Socioeconomic History  . Marital status: Married    Spouse name: lisa  . Number of children: 0  . Years of education: Not on file  . Highest education level: Not on file  Occupational History  . Not on file  Social Needs  . Financial resource strain: Not hard at all  . Food insecurity:    Worry: Never true    Inability: Never true  . Transportation needs:    Medical: No    Non-medical: No  Tobacco Use  . Smoking status: Never Smoker  . Smokeless tobacco: Never Used  Substance and Sexual Activity  . Alcohol use: Yes    Comment: occasional  . Drug use: No  . Sexual activity: Yes  Lifestyle  . Physical activity:    Days per week: 0 days    Minutes per session: 0 min  . Stress: Not at all  Relationships  . Social connections:    Talks on phone: More than three times a week    Gets together: More than three times a week    Attends religious service: 1 to 4 times per year    Active member of club or organization: No    Attends meetings of clubs or organizations: Never    Relationship status: Married  . Intimate partner violence:    Fear of current or ex partner: No    Emotionally abused: No    Physically abused: No    Forced sexual activity: No  Other Topics Concern  . Not on file  Social History Narrative   Married 1985. No kids. 4 small dogs.       Works in Financial trader, residential      Hobbies: work on cars, Haematologist, exercise as able    FAMILY HISTORY: Family History  Problem Relation Age of Onset  . Lung cancer Mother        smoker  . Brain cancer Mother        metastasis  . AAA (abdominal aortic aneurysm) Father        smoker    ALLERGIES:  is allergic to ciprofloxacin.  MEDICATIONS:  Current Outpatient Medications  Medication Sig Dispense Refill  . blood glucose  meter kit and supplies Dispense based on patient and insurance preference. Use daily as directed. (E11.9). 1 each 0  . Choline Fenofibrate 135 MG capsule TAKE 1 CAPSULE DAILY (Patient taking differently: Take 135 mg by mouth every evening. ) 90 capsule 1  . dexamethasone (DECADRON) 4 MG tablet 53m (2 tabs) oral with breakfast for 2 days after each cycle of chemotherapy (Patient taking differently: Take 8 mg by mouth daily. Take 8 mg by mouth with breakfast for 2 days after each cycle  of chemotherapy) 30 tablet 1  . enoxaparin (LOVENOX) 150 MG/ML injection Inject 1 mL (150 mg total) into the skin daily. (Patient taking differently: Inject 150 mg into the skin every evening. ) 30 Syringe 2  . glucose blood (FREESTYLE TEST STRIPS) test strip Use to check blood sugar daily 100 each 4  . JANUMET 50-1000 MG tablet TAKE 1 TABLET TWICE DAILY  WITH MEALS (Patient taking differently: Take 1 tablet by mouth 2 (two) times daily with a meal. ) 180 tablet 3  . lidocaine-prilocaine (EMLA) cream Apply 1 application topically as needed. (Patient taking differently: Apply 1 application topically daily as needed (for treatment.). ) 30 g 2  . loperamide (IMODIUM A-D) 2 MG tablet Take 2 mg by mouth 4 (four) times daily as needed for diarrhea or loose stools.    . ondansetron (ZOFRAN) 8 MG tablet Take 1 tablet (8 mg total) by mouth every 8 (eight) hours as needed for nausea or vomiting. 20 tablet 0  . senna-docusate (SENOKOT-S) 8.6-50 MG tablet Take 2 tablets by mouth at bedtime. (Patient taking differently: Take 2 tablets by mouth at bedtime as needed for mild constipation. ) 60 tablet 1  . furosemide (LASIX) 20 MG tablet TAKE 1 TABLET BY MOUTH DAILY HOLD AFTER 10-15 DAYS IF LEG SWELLING RESOLVED OR IF >10LBS WEIGHT LOSS (Patient not taking: Reported on 06/06/2018) 30 tablet 0   No current facility-administered medications for this visit.     REVIEW OF SYSTEMS:    A 10+ POINT REVIEW OF SYSTEMS WAS OBTAINED including  neurology, dermatology, psychiatry, cardiac, respiratory, lymph, extremities, GI, GU, Musculoskeletal, constitutional, breasts, reproductive, HEENT.  All pertinent positives are noted in the HPI.  All others are negative.   PHYSICAL EXAMINATION: ECOG PERFORMANCE STATUS: 1 - Symptomatic but completely ambulatory  Vitals:   06/06/18 0836  BP: 130/82  Pulse: (!) 120  Resp: 18  Temp: 97.9 F (36.6 C)  SpO2: 99%   Filed Weights   06/06/18 0836  Weight: 227 lb 3.2 oz (103.1 kg)   .Body mass index is 33.55 kg/m.  GENERAL:alert, in no acute distress and comfortable SKIN: no acute rashes, no significant lesions EYES: conjunctiva are pink and non-injected, sclera anicteric OROPHARYNX: MMM, no exudates, no oropharyngeal erythema or ulceration NECK: supple, no JVD LYMPH:  no palpable lymphadenopathy in the cervical, axillary or inguinal regions LUNGS: clear to auscultation b/l with normal respiratory effort HEART: regular rate & rhythm ABDOMEN:  normoactive bowel sounds , non tender, not distended. No palpable hepatosplenomegaly.  Extremity: 1+ right pedal edema, trace left pedal edema  PSYCH: alert & oriented x 3 with fluent speech NEURO: no focal motor/sensory deficits   LABORATORY DATA:  I have reviewed the data as listed  . CBC Latest Ref Rng & Units 06/06/2018 05/29/2018 05/26/2018  WBC 4.0 - 10.3 K/uL 21.3(H) 3.3(L) 4.8  Hemoglobin 13.0 - 17.1 g/dL 9.5(L) 10.3(L) 10.0(L)  Hematocrit 38.4 - 49.9 % 29.2(L) 31.6(L) 29.8(L)  Platelets 140 - 400 K/uL 286 226 456(H)    . CMP Latest Ref Rng & Units 06/06/2018 05/29/2018 05/26/2018  Glucose 70 - 99 mg/dL 295(H) 287(H) 238(H)  BUN 8 - 23 mg/dL 10 16 21   Creatinine 0.61 - 1.24 mg/dL 1.00 0.84 0.70  Sodium 135 - 145 mmol/L 140 135 138  Potassium 3.5 - 5.1 mmol/L 4.6 4.2 3.8  Chloride 98 - 111 mmol/L 104 98 101  CO2 22 - 32 mmol/L 24 27 25   Calcium 8.9 - 10.3 mg/dL 10.3 10.0 9.6  Total Protein 6.5 - 8.1 g/dL 6.6 6.6 -  Total Bilirubin  0.3 - 1.2 mg/dL <0.2(L) 0.6 -  Alkaline Phos 38 - 126 U/L 86 54 -  AST 15 - 41 U/L 16 10(L) -  ALT 0 - 44 U/L 26 22 -   03/17/18 Cytogenetics:   03/17/18 Colon Bx:   03/13/18 Peritoneum Biopsy:     RADIOGRAPHIC STUDIES:  I have personally reviewed the radiological images as listed and agreed with the findings in the report. Dg Fluoro Guide Lumbar Puncture  Result Date: 05/23/2018 CLINICAL DATA:  Lymphoma. EXAM: FLUOROSCOPICALLY GUIDED LUMBAR PUNCTURE FOR INTRATHECAL CHEMOTHERAPY TECHNIQUE: Informed consent was obtained from the patient prior to the procedure, including potential complications of headache, allergy, and pain. A 'time out' was performed. With the patient prone, the lower back was prepped with Betadine. 1% Lidocaine was used for local anesthesia. Lumbar puncture was performed at the L3-4 using a 28 gauge needle with return of clear CSF. methotrexate was injected into the subarachnoid space, as prepared by pharmacy. The patient tolerated the procedure well without apparent complication. FLUOROSCOPY TIME:  5 minutes and 0 seconds IMPRESSION: Intrathecal injection of chemotherapy without complication Electronically Signed   By: Abigail Miyamoto M.D.   On: 05/23/2018 16:29    ASSESSMENT & PLAN:   62 y.o. male with  1. 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 No BM or CNS involvement.   04/18/18 PET/CT revealed Continued improvement in right colonic wall  thickening and surrounding bulky masses consistent with treated lymphoma. No hypermetabolic activity to suggest residual tumor. There is some hypermetabolic activity associated with the sigmoid colon which demonstrates mild wall thickening, surrounding inflammation and underlying diverticulosis, suggesting mild diverticulitis. Suspected treatment related changes throughout the bone marrow. There is mildly increased activity within the clivus without clear corresponding finding on the CT images. Attention on follow-up recommended. Nonobstructing right renal calculi. 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   2. RLE DVT and b/l PE  3.s/pGI bleeding from lymphoma involving bowels-- resolved at this time -- will need to monitor with anticoagulation.  PLAN:  -Discussed pt labwork today, 06/06/18; blood counts are stable -Will refill Lovenox today -The pt has no prohibitive toxicities from beginning C5 EPOCH-R and his 4th IT Methotrexate next week at this time.  -Will finish 6 months of Lovenox and the pt must be in complete remission before deciding to take him off of Lovenox -patient recommended to hold Lovenox on Sunday to prepare for IT methotrexate. -Discussed with the pt and his wife again that there will likely be a role for radiation after completing chemotherapy -Will see the pt back next week for C5 admission on 06/12/18    4. DM2 with some increased hyperglycemia with steroids Plan -monitor  -continue Oral hypoglycemics and carb consistent diet at this time    Admitting patient for [facility] for 5 days from 9/30 IT Methotrexate on 10/1 by IR Rituxan and neulasta as outpatient on 06/19/2018 RTC with Dr [name] with labs in 3 weeks    All of  the patients questions were answered with apparent satisfaction. The patient knows to call the clinic with any problems, questions or concerns.  The total time spent in the appt was 25 minutes and more than 50% was on counseling and direct patient  cares.     Sullivan Lone MD MS AAHIVMS [facility] Hematology/Oncology Physician [facility]  (Office):       [phone] (Work cell):  [phone] (Fax):           [phone]  06/06/2018 9:36 AM  I, Baldwin Jamaica, am acting as a scribe for [name]  .I have reviewed the above documentation for accuracy and completeness, and I agree with the above. [name] MD

## 2018-06-06 ENCOUNTER — Encounter: Payer: Self-pay | Admitting: Hematology

## 2018-06-06 ENCOUNTER — Inpatient Hospital Stay (HOSPITAL_BASED_OUTPATIENT_CLINIC_OR_DEPARTMENT_OTHER): Payer: BLUE CROSS/BLUE SHIELD | Admitting: Hematology

## 2018-06-06 ENCOUNTER — Inpatient Hospital Stay: Payer: BLUE CROSS/BLUE SHIELD

## 2018-06-06 VITALS — BP 130/82 | HR 120 | Temp 97.9°F | Resp 18 | Ht 69.0 in | Wt 227.2 lb

## 2018-06-06 DIAGNOSIS — C8378 Burkitt lymphoma, lymph nodes of multiple sites: Secondary | ICD-10-CM

## 2018-06-06 DIAGNOSIS — Z79899 Other long term (current) drug therapy: Secondary | ICD-10-CM | POA: Diagnosis not present

## 2018-06-06 DIAGNOSIS — Z86711 Personal history of pulmonary embolism: Secondary | ICD-10-CM | POA: Diagnosis not present

## 2018-06-06 DIAGNOSIS — E1165 Type 2 diabetes mellitus with hyperglycemia: Secondary | ICD-10-CM | POA: Diagnosis not present

## 2018-06-06 DIAGNOSIS — I2699 Other pulmonary embolism without acute cor pulmonale: Secondary | ICD-10-CM

## 2018-06-06 DIAGNOSIS — Z86718 Personal history of other venous thrombosis and embolism: Secondary | ICD-10-CM | POA: Diagnosis not present

## 2018-06-06 DIAGNOSIS — E785 Hyperlipidemia, unspecified: Secondary | ICD-10-CM | POA: Diagnosis not present

## 2018-06-06 DIAGNOSIS — Z95828 Presence of other vascular implants and grafts: Secondary | ICD-10-CM

## 2018-06-06 DIAGNOSIS — Z7901 Long term (current) use of anticoagulants: Secondary | ICD-10-CM

## 2018-06-06 LAB — CMP (CANCER CENTER ONLY)
ALBUMIN: 3.4 g/dL — AB (ref 3.5–5.0)
ALK PHOS: 86 U/L (ref 38–126)
ALT: 26 U/L (ref 0–44)
AST: 16 U/L (ref 15–41)
Anion gap: 12 (ref 5–15)
BUN: 10 mg/dL (ref 8–23)
CALCIUM: 10.3 mg/dL (ref 8.9–10.3)
CO2: 24 mmol/L (ref 22–32)
Chloride: 104 mmol/L (ref 98–111)
Creatinine: 1 mg/dL (ref 0.61–1.24)
GFR, Est AFR Am: >60 mL/min (ref >60)
GLUCOSE: 295 mg/dL — AB (ref 70–99)
Potassium: 4.6 mmol/L (ref 3.5–5.1)
Sodium: 140 mmol/L (ref 135–145)
TOTAL PROTEIN: 6.6 g/dL (ref 6.5–8.1)

## 2018-06-06 LAB — CBC WITH DIFFERENTIAL (CANCER CENTER ONLY)
Basophils Absolute: 0 10*3/uL (ref 0.0–0.1)
Basophils Relative: 0 %
EOS ABS: 0 10*3/uL (ref 0.0–0.5)
EOS PCT: 0 %
HCT: 29.2 % — ABNORMAL LOW (ref 38.4–49.9)
Hemoglobin: 9.5 g/dL — ABNORMAL LOW (ref 13.0–17.1)
Lymphocytes Relative: 3 %
Lymphs Abs: 0.7 10*3/uL — ABNORMAL LOW (ref 0.9–3.3)
MCH: 30.1 pg (ref 27.2–33.4)
MCHC: 32.4 g/dL (ref 32.0–36.0)
MCV: 93 fL (ref 79.3–98.0)
MONOS PCT: 10 %
Monocytes Absolute: 2.2 10*3/uL — ABNORMAL HIGH (ref 0.1–0.9)
Neutro Abs: 18.4 10*3/uL — ABNORMAL HIGH (ref 1.5–6.5)
Neutrophils Relative %: 87 %
PLATELETS: 286 10*3/uL (ref 140–400)
RBC: 3.14 MIL/uL — ABNORMAL LOW (ref 4.20–5.82)
RDW: 21.2 % — AB (ref 11.0–14.6)
WBC: 21.3 10*3/uL — AB (ref 4.0–10.3)

## 2018-06-06 MED ORDER — ENOXAPARIN SODIUM 150 MG/ML ~~LOC~~ SOLN: 150.0000 mg | Freq: Every evening | SUBCUTANEOUS | 2 refills | Status: DC

## 2018-06-09 ENCOUNTER — Ambulatory Visit (HOSPITAL_COMMUNITY)
Admission: RE | Admit: 2018-06-09 | Discharge: 2018-06-09 | Disposition: A | Discharge status: Home / Self Care | Payer: BLUE CROSS/BLUE SHIELD | Source: Ambulatory Visit | Attending: Hematology | Admitting: Hematology

## 2018-06-09 DIAGNOSIS — R1907 Generalized intra-abdominal and pelvic swelling, mass and lump: Secondary | ICD-10-CM | POA: Diagnosis not present

## 2018-06-09 DIAGNOSIS — I2510 Atherosclerotic heart disease of native coronary artery without angina pectoris: Secondary | ICD-10-CM | POA: Diagnosis not present

## 2018-06-09 DIAGNOSIS — C8510 Unspecified B-cell lymphoma, unspecified site: Secondary | ICD-10-CM | POA: Diagnosis not present

## 2018-06-09 DIAGNOSIS — C8590 Non-Hodgkin lymphoma, unspecified, unspecified site: Secondary | ICD-10-CM | POA: Diagnosis not present

## 2018-06-09 LAB — GLUCOSE, CAPILLARY: GLUCOSE-CAPILLARY: 173 mg/dL — AB (ref 70–99)

## 2018-06-09 MED ORDER — FLUDEOXYGLUCOSE F - 18 (FDG) INJECTION
11.1500 | Freq: Once | INTRAVENOUS | Status: AC | PRN
  Administered 2018-06-09: 11.15 via INTRAVENOUS

## 2018-06-12 ENCOUNTER — Other Ambulatory Visit: Payer: Self-pay

## 2018-06-12 ENCOUNTER — Encounter (HOSPITAL_COMMUNITY): Payer: Self-pay

## 2018-06-12 ENCOUNTER — Inpatient Hospital Stay (HOSPITAL_COMMUNITY)
Admission: AD | Admit: 2018-06-12 | Discharge: 2018-06-16 | DRG: 847 | Disposition: A | Discharge status: Home / Self Care | Payer: BLUE CROSS/BLUE SHIELD | Source: Ambulatory Visit | Attending: Hematology | Admitting: Hematology

## 2018-06-12 DIAGNOSIS — C8510 Unspecified B-cell lymphoma, unspecified site: Secondary | ICD-10-CM

## 2018-06-12 DIAGNOSIS — C8590 Non-Hodgkin lymphoma, unspecified, unspecified site: Secondary | ICD-10-CM | POA: Diagnosis not present

## 2018-06-12 DIAGNOSIS — Z808 Family history of malignant neoplasm of other organs or systems: Secondary | ICD-10-CM | POA: Diagnosis not present

## 2018-06-12 DIAGNOSIS — T451X5A Adverse effect of antineoplastic and immunosuppressive drugs, initial encounter: Secondary | ICD-10-CM | POA: Diagnosis present

## 2018-06-12 DIAGNOSIS — Z7189 Other specified counseling: Secondary | ICD-10-CM

## 2018-06-12 DIAGNOSIS — I2699 Other pulmonary embolism without acute cor pulmonale: Secondary | ICD-10-CM | POA: Diagnosis not present

## 2018-06-12 DIAGNOSIS — E785 Hyperlipidemia, unspecified: Secondary | ICD-10-CM | POA: Diagnosis present

## 2018-06-12 DIAGNOSIS — Z86711 Personal history of pulmonary embolism: Secondary | ICD-10-CM

## 2018-06-12 DIAGNOSIS — Z801 Family history of malignant neoplasm of trachea, bronchus and lung: Secondary | ICD-10-CM

## 2018-06-12 DIAGNOSIS — J309 Allergic rhinitis, unspecified: Secondary | ICD-10-CM | POA: Diagnosis present

## 2018-06-12 DIAGNOSIS — K922 Gastrointestinal hemorrhage, unspecified: Secondary | ICD-10-CM

## 2018-06-12 DIAGNOSIS — D6481 Anemia due to antineoplastic chemotherapy: Secondary | ICD-10-CM | POA: Diagnosis present

## 2018-06-12 DIAGNOSIS — T380X5A Adverse effect of glucocorticoids and synthetic analogues, initial encounter: Secondary | ICD-10-CM | POA: Diagnosis not present

## 2018-06-12 DIAGNOSIS — E1169 Type 2 diabetes mellitus with other specified complication: Secondary | ICD-10-CM | POA: Diagnosis not present

## 2018-06-12 DIAGNOSIS — Z7984 Long term (current) use of oral hypoglycemic drugs: Secondary | ICD-10-CM | POA: Diagnosis not present

## 2018-06-12 DIAGNOSIS — Z881 Allergy status to other antibiotic agents status: Secondary | ICD-10-CM | POA: Diagnosis not present

## 2018-06-12 DIAGNOSIS — I82411 Acute embolism and thrombosis of right femoral vein: Secondary | ICD-10-CM | POA: Diagnosis not present

## 2018-06-12 DIAGNOSIS — C8370 Burkitt lymphoma, unspecified site: Secondary | ICD-10-CM | POA: Diagnosis not present

## 2018-06-12 DIAGNOSIS — C8378 Burkitt lymphoma, lymph nodes of multiple sites: Secondary | ICD-10-CM | POA: Diagnosis not present

## 2018-06-12 DIAGNOSIS — Z5111 Encounter for antineoplastic chemotherapy: Principal | ICD-10-CM

## 2018-06-12 DIAGNOSIS — E1165 Type 2 diabetes mellitus with hyperglycemia: Secondary | ICD-10-CM

## 2018-06-12 DIAGNOSIS — Z86718 Personal history of other venous thrombosis and embolism: Secondary | ICD-10-CM

## 2018-06-12 LAB — COMPREHENSIVE METABOLIC PANEL
ALT: 18 U/L (ref 0–44)
ANION GAP: 10 (ref 5–15)
AST: 20 U/L (ref 15–41)
Albumin: 3.7 g/dL (ref 3.5–5.0)
Alkaline Phosphatase: 52 U/L (ref 38–126)
BILIRUBIN TOTAL: 0.3 mg/dL (ref 0.3–1.2)
BUN: 12 mg/dL (ref 8–23)
CHLORIDE: 107 mmol/L (ref 98–111)
CO2: 27 mmol/L (ref 22–32)
Calcium: 10 mg/dL (ref 8.9–10.3)
Creatinine, Ser: 0.77 mg/dL (ref 0.61–1.24)
GFR calc Af Amer: >60 mL/min (ref >60)
Glucose, Bld: 143 mg/dL — ABNORMAL HIGH (ref 70–99)
POTASSIUM: 4.1 mmol/L (ref 3.5–5.1)
Sodium: 144 mmol/L (ref 135–145)
Total Protein: 6.4 g/dL — ABNORMAL LOW (ref 6.5–8.1)

## 2018-06-12 LAB — MAGNESIUM: MAGNESIUM: 1.7 mg/dL (ref 1.7–2.4)

## 2018-06-12 LAB — PROTIME-INR
INR: 0.92
PROTHROMBIN TIME: 12.2 s (ref 11.4–15.2)

## 2018-06-12 LAB — CBC WITH DIFFERENTIAL/PLATELET
Basophils Absolute: 0 10*3/uL (ref 0.0–0.1)
Basophils Relative: 1 %
EOS ABS: 0 10*3/uL (ref 0.0–0.7)
Eosinophils Relative: 0 %
HCT: 28.9 % — ABNORMAL LOW (ref 39.0–52.0)
HEMOGLOBIN: 9.2 g/dL — AB (ref 13.0–17.0)
Lymphocytes Relative: 12 %
Lymphs Abs: 0.8 10*3/uL (ref 0.7–4.0)
MCH: 30.6 pg (ref 26.0–34.0)
MCHC: 31.8 g/dL (ref 30.0–36.0)
MCV: 96 fL (ref 78.0–100.0)
Monocytes Absolute: 1 10*3/uL (ref 0.1–1.0)
Monocytes Relative: 15 %
NEUTROS PCT: 72 %
Neutro Abs: 4.7 10*3/uL (ref 1.7–7.7)
Platelets: 409 10*3/uL — ABNORMAL HIGH (ref 150–400)
RBC: 3.01 MIL/uL — ABNORMAL LOW (ref 4.22–5.81)
RDW: 20.1 % — AB (ref 11.5–15.5)
WBC: 6.6 10*3/uL (ref 4.0–10.5)

## 2018-06-12 LAB — APTT: aPTT: 24 seconds (ref 24–36)

## 2018-06-12 LAB — PHOSPHORUS: PHOSPHORUS: 3.2 mg/dL (ref 2.5–4.6)

## 2018-06-12 MED ORDER — LIDOCAINE-PRILOCAINE 2.5-2.5 % EX CREA: 1.0000 "application " | TOPICAL_CREAM | CUTANEOUS | Status: DC | PRN

## 2018-06-12 MED ORDER — HOT PACK MISC ONCOLOGY
1.0000 | Freq: Once | Status: DC | PRN
  Filled 2018-06-12: qty 1

## 2018-06-12 MED ORDER — METFORMIN HCL 500 MG PO TABS
1000.0000 mg | ORAL_TABLET | Freq: Two times a day (BID) | ORAL | Status: DC
  Administered 2018-06-12 – 2018-06-16 (×8): 1000 mg via ORAL
  Filled 2018-06-12 (×8): qty 2

## 2018-06-12 MED ORDER — SITAGLIPTIN PHOSPHATE 25 MG PO TABS
50.0000 mg | ORAL_TABLET | Freq: Two times a day (BID) | ORAL | Status: DC
  Administered 2018-06-12 – 2018-06-16 (×8): 50 mg via ORAL
  Filled 2018-06-12 (×8): qty 2

## 2018-06-12 MED ORDER — SODIUM CHLORIDE 0.9 % IV SOLN
INTRAVENOUS | Status: DC
  Administered 2018-06-12: 16:00:00 via INTRAVENOUS

## 2018-06-12 MED ORDER — ACETAMINOPHEN 325 MG PO TABS: 650.0000 mg | ORAL_TABLET | Freq: Once | ORAL | Status: DC

## 2018-06-12 MED ORDER — SITAGLIPTIN PHOS-METFORMIN HCL 50-1000 MG PO TABS: 1.0000 | ORAL_TABLET | Freq: Two times a day (BID) | ORAL | Status: DC

## 2018-06-12 MED ORDER — SODIUM CHLORIDE 0.9% FLUSH: 3.0000 mL | INTRAVENOUS | Status: DC | PRN

## 2018-06-12 MED ORDER — COLD PACK MISC ONCOLOGY
1.0000 | Freq: Once | Status: DC | PRN
  Filled 2018-06-12: qty 1

## 2018-06-12 MED ORDER — HEPARIN SOD (PORK) LOCK FLUSH 100 UNIT/ML IV SOLN
500.0000 [IU] | Freq: Once | INTRAVENOUS | Status: DC | PRN
  Filled 2018-06-12: qty 5

## 2018-06-12 MED ORDER — SENNOSIDES-DOCUSATE SODIUM 8.6-50 MG PO TABS: 2.0000 | ORAL_TABLET | Freq: Every evening | ORAL | Status: DC | PRN

## 2018-06-12 MED ORDER — FUROSEMIDE 20 MG PO TABS
20.0000 mg | ORAL_TABLET | Freq: Every day | ORAL | Status: DC
  Administered 2018-06-14 – 2018-06-15 (×2): 20 mg via ORAL
  Filled 2018-06-12 (×2): qty 1

## 2018-06-12 MED ORDER — SODIUM CHLORIDE 0.9% FLUSH: 10.0000 mL | INTRAVENOUS | Status: DC | PRN

## 2018-06-12 MED ORDER — ALTEPLASE 2 MG IJ SOLR
2.0000 mg | Freq: Once | INTRAMUSCULAR | Status: DC | PRN
  Filled 2018-06-12: qty 2

## 2018-06-12 MED ORDER — DIPHENHYDRAMINE HCL 50 MG PO CAPS: 50.0000 mg | ORAL_CAPSULE | Freq: Once | ORAL | Status: DC

## 2018-06-12 MED ORDER — SODIUM CHLORIDE 0.9 % IV SOLN
Freq: Once | INTRAVENOUS | Status: AC
  Administered 2018-06-12: 18 mg via INTRAVENOUS
  Filled 2018-06-12: qty 4

## 2018-06-12 MED ORDER — HEPARIN SOD (PORK) LOCK FLUSH 100 UNIT/ML IV SOLN: 250.0000 [IU] | Freq: Once | INTRAVENOUS | Status: DC | PRN

## 2018-06-12 MED ORDER — VINCRISTINE SULFATE CHEMO INJECTION 1 MG/ML
Freq: Once | INTRAVENOUS | Status: AC
  Administered 2018-06-12: 16:00:00 via INTRAVENOUS
  Filled 2018-06-12: qty 12

## 2018-06-12 MED ORDER — PREDNISONE 5 MG PO TABS
30.0000 mg | ORAL_TABLET | Freq: Two times a day (BID) | ORAL | Status: DC
  Administered 2018-06-12 – 2018-06-16 (×8): 30 mg via ORAL
  Filled 2018-06-12 (×8): qty 1

## 2018-06-12 NOTE — Progress Notes (Signed)
Doses and dilutions for doxorubicin, etoposide and oncovin verified with Laural Benes, RN.

## 2018-06-12 NOTE — H&P (Signed)
HEMATOLOGY/ONCOLOGY H&P NOTE  Date of Service: 06/12/2018  Patient Care Team: [name], MD as PCP - General (Family Medicine)  CHIEF COMPLAINTS/PURPOSE OF CONSULTATION:  C5 EPOCH-R treatment of Burkitt's Lymphoma   HISTORY OF PRESENTING ILLNESS:   Mark Frey is a wonderful 62 y.o. male who has been admitted today to begin C5 of his EPOCH-R treatment for his Burkitt's lymphoma.I last saw the pt on 06/06/18 in clinic. He is accompanied today by his wife. The pt reports that he is doing well overall.   The pt reports that he hasn't had any concerns in the interim. He notes that he is ready to begin C5 EPOCH-R and denies any concerns for infections. He notes that he is moving his bowels well and denies any abdominal pains. His leg swelling has continued to resolve and has not progressed.   The pt also notes that his energy levels continue to be stable and has eaten well.   The pt held his Lovenox last night 06/11/18 in anticipation of receiving IT MTX.  Of note since the patient's last visit, pt has had a PET/CT completed on 06/09/18 with results revealing Soft tissue lesion within the ventral pelvis is again identified demonstrating mild to moderate increased uptake within SUV max of 5.61. Deauville criteria 4. Concerning for residual metabolically active tumor. 2. Similar appearance of diffusely thickened appendix within the right lower quadrant of the abdomen. Findings may reflect treatment related changes. 3. Increased radiotracer uptake throughout the bone marrow which is favored to represent treatment related changes. 4. Lad coronary artery atherosclerotic calcifications.  Lab results today (06/12/18) of CBC w/diff, CMP, and Reticulocytes is as follows: all values are WNL except for RBC at 3.01, HGB at 9.2, HCT at 28.9, RDW at 20.1, PLT at 409k, Glucose at 143, Total Protein at 6.4.  On review of systems, pt reports moving his bowels well, good energy levels, and denies  fevers, chills, night sweats, problems with the port, abdominal pains, CP, SOB, skin rashes, concerns for infections, blood in the stools, mouth sores, and any other symptoms.   MEDICAL HISTORY:  Past Medical History:  Diagnosis Date  . ALLERGIC RHINITIS   . Diabetes mellitus   . Hyperlipidemia     SURGICAL HISTORY: Past Surgical History:  Procedure Laterality Date  . BIOPSY  03/15/2018   Procedure: BIOPSY;  Surgeon: Milus Banister, MD;  Location: [facility];  Service: Endoscopy;;  . CLEFT PALATE REPAIR    . COLONOSCOPY N/A 03/15/2018   Procedure: COLONOSCOPY;  Surgeon: Milus Banister, MD;  Location: [facility];  Service: Endoscopy;  Laterality: N/A;  . deviated septum repair     slight improvement  . ESOPHAGOGASTRODUODENOSCOPY N/A 03/15/2018   Procedure: ESOPHAGOGASTRODUODENOSCOPY (EGD);  Surgeon: Milus Banister, MD;  Location: [name];  Service: Endoscopy;  Laterality: N/A;  . IR IMAGING GUIDED PORT INSERTION  03/17/2018  . TONSILLECTOMY      SOCIAL HISTORY: Social History   Socioeconomic History  . Marital status: Married    Spouse name: lisa  . Number of children: 0  . Years of education: Not on file  . Highest education level: Not on file  Occupational History  . Not on file  Social Needs  . Financial resource strain: Not hard at all  . Food insecurity:    Worry: Never true    Inability: Never true  . Transportation needs:    Medical: No    Non-medical: No  Tobacco Use  .  Smoking status: Never Smoker  . Smokeless tobacco: Never Used  Substance and Sexual Activity  . Alcohol use: Yes    Comment: occasional  . Drug use: No  . Sexual activity: Yes  Lifestyle  . Physical activity:    Days per week: 0 days    Minutes per session: 0 min  . Stress: Not at all  Relationships  . Social connections:    Talks on phone: More than three times a week    Gets together: More than three times a week    Attends religious service: 1 to 4 times per year     Active member of club or organization: No    Attends meetings of clubs or organizations: Never    Relationship status: Married  . Intimate partner violence:    Fear of current or ex partner: No    Emotionally abused: No    Physically abused: No    Forced sexual activity: No  Other Topics Concern  . Not on file  Social History Narrative   Married 1985. No kids. 4 small dogs.       Works in Financial trader, residential      Hobbies: work on cars, Haematologist, exercise as able    FAMILY HISTORY: Family History  Problem Relation Age of Onset  . Lung cancer Mother        smoker  . Brain cancer Mother        metastasis  . AAA (abdominal aortic aneurysm) Father        smoker    ALLERGIES:  is allergic to ciprofloxacin.  MEDICATIONS:  Current Facility-Administered Medications  Medication Dose Route Frequency Provider Last Rate Last Dose  . 0.9 %  sodium chloride infusion   Intravenous Continuous [name], MD      . alteplase (CATHFLO ACTIVASE) injection 2 mg  2 mg Intracatheter Once PRN [name], MD      . Cold Pack 1 packet  1 packet Topical Once PRN [name], MD      . DOXOrubicin (ADRIAMYCIN) 24 mg, etoposide (VEPESID) 120 mg, vinCRIStine (ONCOVIN) 1 mg in sodium chloride 0.9 % 1,000 mL chemo infusion   Intravenous Once [name], MD      . furosemide (LASIX) tablet 20 mg  20 mg Oral Daily [name], MD      . heparin lock flush 100 unit/mL  500 Units Intracatheter Once PRN [name], MD      . heparin lock flush 100 unit/mL  250 Units Intracatheter Once PRN [name], MD      . Hot Pack 1 packet  1 packet Topical Once PRN [name], MD      . lidocaine-prilocaine (EMLA) cream 1 application  1 application Topical PRN [name], MD      . sitaGLIPtin (JANUVIA) tablet 50 mg  50 mg Oral BID WC [name], MD       And  . metFORMIN (GLUCOPHAGE) tablet 1,000 mg  1,000  mg Oral BID WC [name], MD      . ondansetron (ZOFRAN) 8 mg, dexamethasone (DECADRON) 10 mg in sodium chloride 0.9 % 50 mL IVPB   Intravenous Once [name], MD      . predniSONE (DELTASONE) tablet 30 mg  30 mg Oral BID WC [name], MD      . senna-docusate (Senokot-S) tablet 2 tablet  2 tablet Oral QHS PRN [name],  Cloria Spring, MD      . sodium chloride flush (NS) 0.9 % injection 10 mL  10 mL Intracatheter PRN [name], MD      . sodium chloride flush (NS) 0.9 % injection 3 mL  3 mL Intracatheter PRN [name], MD        REVIEW OF SYSTEMS:    10 Point review of Systems was done is negative except as noted above.  PHYSICAL EXAMINATION: ECOG PERFORMANCE STATUS: 1 - Symptomatic but completely ambulatory  . Vitals:   06/12/18 1056  BP: (!) 141/78  Pulse: (!) 107  Resp: 18  Temp: 99.6 F (37.6 C)  SpO2: 100%   Filed Weights   06/12/18 1056  Weight: 226 lb 6.6 oz (102.7 kg)   .Body mass index is 33.44 kg/m.   GENERAL:alert, in no acute distress and comfortable SKIN: no acute rashes, no significant lesions EYES: conjunctiva are pink and non-injected, sclera anicteric OROPHARYNX: MMM, no exudates, no oropharyngeal erythema or ulceration NECK: supple, no JVD LYMPH:  no palpable lymphadenopathy in the cervical, axillary or inguinal regions LUNGS: clear to auscultation b/l with normal respiratory effort HEART: regular rate & rhythm ABDOMEN:  normoactive bowel sounds , non tender, not distended. No palpable hepatosplenomegaly.  Extremity: 1+ right pedal edema, trace left pedal edema  PSYCH: alert & oriented x 3 with fluent speech NEURO: no focal motor/sensory deficits   LABORATORY DATA:  I have reviewed the data as listed  . CBC Latest Ref Rng & Units 06/12/2018 06/06/2018 05/29/2018  WBC 4.0 - 10.5 K/uL 6.6 21.3(H) 3.3(L)  Hemoglobin 13.0 - 17.0 g/dL 9.2(L) 9.5(L) 10.3(L)  Hematocrit 39.0 - 52.0 % 28.9(L) 29.2(L)  31.6(L)  Platelets 150 - 400 K/uL 409(H) 286 226    . CMP Latest Ref Rng & Units 06/12/2018 06/06/2018 05/29/2018  Glucose 70 - 99 mg/dL 143(H) 295(H) 287(H)  BUN 8 - 23 mg/dL 12 10 16   Creatinine 0.61 - 1.24 mg/dL 0.77 1.00 0.84  Sodium 135 - 145 mmol/L 144 140 135  Potassium 3.5 - 5.1 mmol/L 4.1 4.6 4.2  Chloride 98 - 111 mmol/L 107 104 98  CO2 22 - 32 mmol/L 27 24 27   Calcium 8.9 - 10.3 mg/dL 10.0 10.3 10.0  Total Protein 6.5 - 8.1 g/dL 6.4(L) 6.6 6.6  Total Bilirubin 0.3 - 1.2 mg/dL 0.3 <0.2(L) 0.6  Alkaline Phos 38 - 126 U/L 52 86 54  AST 15 - 41 U/L 20 16 10(L)  ALT 0 - 44 U/L 18 26 22    Component     Latest Ref Rng & Units 06/12/2018  Prothrombin Time     11.4 - 15.2 seconds 12.2  INR      0.92  APTT     24 - 36 seconds 24    RADIOGRAPHIC STUDIES: I have personally reviewed the radiological images as listed and agreed with the findings in the report. Nm Pet Image Restag (ps) Skull Base To Thigh  Result Date: 06/09/2018 CLINICAL DATA:  Subsequent treatment strategy for lymphoma. EXAM: NUCLEAR MEDICINE PET SKULL BASE TO THIGH TECHNIQUE: 11.15 mCi F-18 FDG was injected intravenously. Full-ring PET imaging was performed from the skull base to thigh after the radiotracer. CT data was obtained and used for attenuation correction and anatomic localization. Fasting blood glucose: 173 mg/dl COMPARISON:  04/18/2018 FINDINGS: Mediastinal blood pool activity: SUV max 2.69 Liver activity: SUV max 3.47 NECK: No hypermetabolic lymph nodes in the neck. Incidental CT findings: none CHEST: No hypermetabolic mediastinal  or hilar nodes. No suspicious pulmonary nodules on the CT scan. Incidental CT findings: Lad coronary artery calcification noted. ABDOMEN/PELVIS: No abnormal hypermetabolic activity within the liver, pancreas, adrenal glands, or spleen. No hypermetabolic lymph nodes in the abdomen or pelvis. Within the ventral aspect of the pelvis there is a soft tissue nodule with surrounding fat  stranding measuring 2.7 by 2.1 cm within SUV max of 5.61, image 173/4. Deauville criteria 4. Previously this measured 2.8 x 2.6 cm within SUV max of 3.8. Incidental CT findings: The appendix is again noted and appears diffusely thickened measuring 1.3 cm in maximum thickness. Gas noted within the lumen of the appendix suggesting patency. SKELETON: No focal hypermetabolic activity to suggest skeletal metastasis.There is diffusely increased radiotracer activity throughout the axial and appendicular skeleton which is favored to represent treatment related changes Incidental CT findings: None IMPRESSION: 1. Soft tissue lesion within the ventral pelvis is again identified demonstrating mild to moderate increased uptake within SUV max of 5.61. Deauville criteria 4. Concerning for residual metabolically active tumor. 2. Similar appearance of diffusely thickened appendix within the right lower quadrant of the abdomen. Findings may reflect treatment related changes. 3. Increased radiotracer uptake throughout the bone marrow which is favored to represent treatment related changes. 4. Lad coronary artery atherosclerotic calcifications. Electronically Signed   By: Kerby Moors M.D.   On: 06/09/2018 13:06   Dg Fluoro Guide Lumbar Puncture  Result Date: 05/23/2018 CLINICAL DATA:  Lymphoma. EXAM: FLUOROSCOPICALLY GUIDED LUMBAR PUNCTURE FOR INTRATHECAL CHEMOTHERAPY TECHNIQUE: Informed consent was obtained from the patient prior to the procedure, including potential complications of headache, allergy, and pain. A 'time out' was performed. With the patient prone, the lower back was prepped with Betadine. 1% Lidocaine was used for local anesthesia. Lumbar puncture was performed at the L3-4 using a 28 gauge needle with return of clear CSF. methotrexate was injected into the subarachnoid space, as prepared by pharmacy. The patient tolerated the procedure well without apparent complication. FLUOROSCOPY TIME:  5 minutes and 0  seconds IMPRESSION: Intrathecal injection of chemotherapy without complication Electronically Signed   By: Abigail Miyamoto M.D.   On: 05/23/2018 16:29    ASSESSMENT & PLAN:  62 y.o. male with  1. 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 No BM or CNS involvement.   04/18/18 PET/CT revealed Continued improvement in right colonic wall thickening and surrounding bulky masses consistent with treated lymphoma. No hypermetabolic activity to suggest residual tumor. There is some hypermetabolic activity associated with the sigmoid colon which demonstrates mild wall thickening, surrounding inflammation and underlying diverticulosis, suggesting mild diverticulitis. Suspected treatment related changes throughout the bone marrow. There is mildly increased activity within the clivus without clear corresponding finding on the CT images. Attention on follow-up recommended. Nonobstructing right renal calculi. 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   2. RLE DVT and b/l PE  3.s/pGI bleeding from lymphoma involving bowels-- resolved at this time -- will need to monitor with anticoagulation.  PLAN: -Discussed pt labwork today, 06/12/18; blood counts and chemistries are stable  -The pt  has no prohibitive toxicities from beginning [place] at this time.   -Chemotherapy orders placed and signed -Fourth dose of Intrathecal Methotrexate tomorrow 06/13/18 -IR consulted and orders placed for  LP for IT MTX -Discussed the 06/09/18 PET/CT which revealed Soft tissue lesion within the ventral pelvis is again identified demonstrating mild to moderate increased uptake within SUV max of 5.61. Deauville criteria 4. Concerning for residual metabolically active tumor. 2. Similar appearance of diffusely thickened appendix within the right lower quadrant of the abdomen. Findings may reflect treatment related changes. 3. Increased radiotracer uptake throughout the bone marrow which is favored to represent treatment related changes. 4. Lad coronary artery atherosclerotic calcifications.   -outpatient Rituxan and neulasta on 06/19/2018 -lovenox on hold from Sunday for LP tomorrow then will restart 24h later -SCD for DVT prophylaxis at this time.  4. DM2 with some increased hyperglycemia with steroids Plan -monitor  -continue Oral hypoglycemics and carb consistent diet at this time     All of the patients questions were answered with apparent satisfaction.  The total time spent in the appt was 70 minutes and more than 50% was on counseling and direct patient cares and co-ordination of cares with nursing and pharmacy    Thalia Turkington MD MS AAHIVMS [facility] C[facility] Hematology/Oncology Physician [facility]  (Office):       [phone] (Work cell):  [phone] (Fax):           [phone]  06/12/2018 2:31 PM  I, Baldwin [place], am acting as a scribe for [name]  .I have reviewed the above documentation for accuracy and completeness, and I agree with the above. Sullivan Lone MD MS

## 2018-06-13 ENCOUNTER — Inpatient Hospital Stay (HOSPITAL_COMMUNITY): Payer: BLUE CROSS/BLUE SHIELD

## 2018-06-13 LAB — BASIC METABOLIC PANEL
Anion gap: 7 (ref 5–15)
BUN: 14 mg/dL (ref 8–23)
CALCIUM: 9.6 mg/dL (ref 8.9–10.3)
CO2: 23 mmol/L (ref 22–32)
Chloride: 109 mmol/L (ref 98–111)
Creatinine, Ser: 0.67 mg/dL (ref 0.61–1.24)
Glucose, Bld: 272 mg/dL — ABNORMAL HIGH (ref 70–99)
Potassium: 4.7 mmol/L (ref 3.5–5.1)
Sodium: 139 mmol/L (ref 135–145)

## 2018-06-13 LAB — CBC
HCT: 29.6 % — ABNORMAL LOW (ref 39.0–52.0)
Hemoglobin: 9.4 g/dL — ABNORMAL LOW (ref 13.0–17.0)
MCH: 30.4 pg (ref 26.0–34.0)
MCHC: 31.8 g/dL (ref 30.0–36.0)
MCV: 95.8 fL (ref 78.0–100.0)
PLATELETS: 434 10*3/uL — AB (ref 150–400)
RBC: 3.09 MIL/uL — ABNORMAL LOW (ref 4.22–5.81)
RDW: 19.8 % — AB (ref 11.5–15.5)
WBC: 9.8 10*3/uL (ref 4.0–10.5)

## 2018-06-13 MED ORDER — SODIUM CHLORIDE 0.9% FLUSH: 10.0000 mL | INTRAVENOUS | Status: DC | PRN

## 2018-06-13 MED ORDER — SODIUM CHLORIDE 0.9 % IJ SOLN
Freq: Once | INTRAMUSCULAR | Status: AC
  Administered 2018-06-13: 12:00:00 via INTRATHECAL
  Filled 2018-06-13: qty 0.48

## 2018-06-13 MED ORDER — SODIUM CHLORIDE 0.9 % IV SOLN
Freq: Once | INTRAVENOUS | Status: AC
  Administered 2018-06-13: 18 mg via INTRAVENOUS
  Filled 2018-06-13: qty 4

## 2018-06-13 MED ORDER — VINCRISTINE SULFATE CHEMO INJECTION 1 MG/ML
Freq: Once | INTRAVENOUS | Status: AC
  Administered 2018-06-13: 15:00:00 via INTRAVENOUS
  Filled 2018-06-13: qty 12

## 2018-06-13 MED ORDER — LIDOCAINE HCL 1 % IJ SOLN
INTRAMUSCULAR | Status: AC
  Filled 2018-06-13: qty 20

## 2018-06-13 NOTE — Progress Notes (Signed)
HEMATOLOGY/ONCOLOGY INPATIENT PROGRESS NOTE  Date of Service: 06/13/2018  Inpatient Attending: .[name], MD   SUBJECTIVE:   Mark Frey reports that he is doing well overall and notes that he has been lying flat on his back for at least four hours after his uneventful intrathecal methotrexate.   The pt reports that he has not had any headaches, nausea, vomiting, or abdominal pains. He notes that he has been sleeping well also. The pt notes that he continues to tolerate C5D2 EPOCH-R without any difficulty or concern and has been eating well.   Lab results today (06/13/18) of CBC and BMP is as follows: all values are WNL except for RBC at 3.09, HGB at 9.4, HCT at 29.6, RDW at 19.8, PLT at 434k, Glucose at 272.  On review of systems, pt reports moving his bowels, eating well, stable energy levels, and denies nausea, vomiting, diarrhea, abdominal pains, headaches, and any other symptoms.    OBJECTIVE:  NAD  PHYSICAL EXAMINATION: . Vitals:   06/12/18 1400 06/12/18 2122 06/13/18 0457 06/13/18 1325  BP: 130/70 121/71 (!) 141/83 124/64  Pulse: (!) 107 95 82 (!) 113  Resp: 20 18 16 16   Temp: 99.8 F (37.7 C) 98 F (36.7 C) 97.7 F (36.5 C) 99.4 F (37.4 C)  TempSrc: Oral Oral Oral Oral  SpO2: 98% 95% 95% 98%  Weight:      Height:       Filed Weights   06/12/18 1056  Weight: 226 lb 6.6 oz (102.7 kg)   .Body mass index is 33.44 kg/m.  GENERAL:alert, in no acute distress and comfortable SKIN: skin color, texture, turgor are normal, no rashes or significant lesions EYES: normal, conjunctiva are pink and non-injected, sclera clear OROPHARYNX:no exudate, no erythema and lips, buccal mucosa, and tongue normal  NECK: supple, no JVD, thyroid normal size, non-tender, without nodularity LYMPH:  no palpable lymphadenopathy in the cervical, axillary or inguinal LUNGS: clear to auscultation with normal respiratory effort HEART: regular rate & rhythm,  no murmurs and  1+ right pedal edema and trace left pedal edema  ABDOMEN: abdomen soft, non-tender, normoactive bowel sounds  Musculoskeletal: no cyanosis of digits and no clubbing  PSYCH: alert & oriented x 3 with fluent speech NEURO: no focal motor/sensory deficits  MEDICAL HISTORY:  Past Medical History:  Diagnosis Date  . ALLERGIC RHINITIS   . Diabetes mellitus   . Hyperlipidemia     SURGICAL HISTORY: Past Surgical History:  Procedure Laterality Date  . BIOPSY  03/15/2018   Procedure: BIOPSY;  Surgeon: Milus Banister, MD;  Location: [facility];  Service: Endoscopy;;  . CLEFT PALATE REPAIR    . COLONOSCOPY N/A 03/15/2018   Procedure: COLONOSCOPY;  Surgeon: Milus Banister, MD;  Location: [facility];  Service: Endoscopy;  Laterality: N/A;  . deviated septum repair     slight improvement  . ESOPHAGOGASTRODUODENOSCOPY N/A 03/15/2018   Procedure: ESOPHAGOGASTRODUODENOSCOPY (EGD);  Surgeon: Milus Banister, MD;  Location: [name];  Service: Endoscopy;  Laterality: N/A;  . IR IMAGING GUIDED PORT INSERTION  03/17/2018  . TONSILLECTOMY      SOCIAL HISTORY: Social History   Socioeconomic History  . Marital status: Married    Spouse name: lisa  . Number of children: 0  . Years of education: Not on file  . Highest education level: Not on file  Occupational History  . Not on file  Social Needs  . Financial resource strain: Not hard at all  . Food  insecurity:    Worry: Never true    Inability: Never true  . Transportation needs:    Medical: No    Non-medical: No  Tobacco Use  . Smoking status: Never Smoker  . Smokeless tobacco: Never Used  Substance and Sexual Activity  . Alcohol use: Yes    Comment: occasional  . Drug use: No  . Sexual activity: Yes  Lifestyle  . Physical activity:    Days per week: 0 days    Minutes per session: 0 min  . Stress: Not at all  Relationships  . Social connections:    Talks on phone: More than three times a week    Gets together: More than  three times a week    Attends religious service: 1 to 4 times per year    Active member of club or organization: No    Attends meetings of clubs or organizations: Never    Relationship status: Married  . Intimate partner violence:    Fear of current or ex partner: No    Emotionally abused: No    Physically abused: No    Forced sexual activity: No  Other Topics Concern  . Not on file  Social History Narrative   Married 1985. No kids. 4 small dogs.       Works in Financial trader, residential      Hobbies: work on cars, Haematologist, exercise as able    FAMILY HISTORY: Family History  Problem Relation Age of Onset  . Lung cancer Mother        smoker  . Brain cancer Mother        metastasis  . AAA (abdominal aortic aneurysm) Father        smoker    ALLERGIES:  is allergic to ciprofloxacin.  MEDICATIONS:  Scheduled Meds: . DOXOrubicin/vinCRIStine/etoposide CHEMO IV infusion for Inpatient CI   Intravenous Once  . furosemide  20 mg Oral Daily  . sitaGLIPtin  50 mg Oral BID WC   And  . metFORMIN  1,000 mg Oral BID WC  . predniSONE  30 mg Oral BID WC   Continuous Infusions: . sodium chloride 20 mL/hr at 06/12/18 2352   PRN Meds:.alteplase, Cold Pack, heparin lock flush, heparin lock flush, Hot Pack, lidocaine-prilocaine, senna-docusate, sodium chloride flush, sodium chloride flush, sodium chloride flush  REVIEW OF SYSTEMS:    10 Point review of Systems was done is negative except as noted above.   LABORATORY DATA:  I have reviewed the data as listed  . CBC Latest Ref Rng & Units 06/13/2018 06/12/2018 06/06/2018  WBC 4.0 - 10.5 K/uL 9.8 6.6 21.3(H)  Hemoglobin 13.0 - 17.0 g/dL 9.4(L) 9.2(L) 9.5(L)  Hematocrit 39.0 - 52.0 % 29.6(L) 28.9(L) 29.2(L)  Platelets 150 - 400 K/uL 434(H) 409(H) 286    . CMP Latest Ref Rng & Units 06/13/2018 06/12/2018 06/06/2018  Glucose 70 - 99 mg/dL 272(H) 143(H) 295(H)  BUN 8 - 23 mg/dL 14 12 10   Creatinine 0.61 - 1.24 mg/dL 0.67 0.77  1.00  Sodium 135 - 145 mmol/L 139 144 140  Potassium 3.5 - 5.1 mmol/L 4.7 4.1 4.6  Chloride 98 - 111 mmol/L 109 107 104  CO2 22 - 32 mmol/L 23 27 24   Calcium 8.9 - 10.3 mg/dL 9.6 10.0 10.3  Total Protein 6.5 - 8.1 g/dL - 6.4(L) 6.6  Total Bilirubin 0.3 - 1.2 mg/dL - 0.3 <0.2(L)  Alkaline Phos 38 - 126 U/L - 52 86  AST 15 - 41 U/L -  20 16  ALT 0 - 44 U/L - 18 26     RADIOGRAPHIC STUDIES: I have personally reviewed the radiological images as listed and agreed with the findings in the report. Nm Pet Image Restag (ps) Skull Base To Thigh  Result Date: 06/09/2018 CLINICAL DATA:  Subsequent treatment strategy for lymphoma. EXAM: NUCLEAR MEDICINE PET SKULL BASE TO THIGH TECHNIQUE: 11.15 mCi F-18 FDG was injected intravenously. Full-ring PET imaging was performed from the skull base to thigh after the radiotracer. CT data was obtained and used for attenuation correction and anatomic localization. Fasting blood glucose: 173 mg/dl COMPARISON:  04/18/2018 FINDINGS: Mediastinal blood pool activity: SUV max 2.69 Liver activity: SUV max 3.47 NECK: No hypermetabolic lymph nodes in the neck. Incidental CT findings: none CHEST: No hypermetabolic mediastinal or hilar nodes. No suspicious pulmonary nodules on the CT scan. Incidental CT findings: Lad coronary artery calcification noted. ABDOMEN/PELVIS: No abnormal hypermetabolic activity within the liver, pancreas, adrenal glands, or spleen. No hypermetabolic lymph nodes in the abdomen or pelvis. Within the ventral aspect of the pelvis there is a soft tissue nodule with surrounding fat stranding measuring 2.7 by 2.1 cm within SUV max of 5.61, image 173/4. Deauville criteria 4. Previously this measured 2.8 x 2.6 cm within SUV max of 3.8. Incidental CT findings: The appendix is again noted and appears diffusely thickened measuring 1.3 cm in maximum thickness. Gas noted within the lumen of the appendix suggesting patency. SKELETON: No focal hypermetabolic activity to  suggest skeletal metastasis.There is diffusely increased radiotracer activity throughout the axial and appendicular skeleton which is favored to represent treatment related changes Incidental CT findings: None IMPRESSION: 1. Soft tissue lesion within the ventral pelvis is again identified demonstrating mild to moderate increased uptake within SUV max of 5.61. Deauville criteria 4. Concerning for residual metabolically active tumor. 2. Similar appearance of diffusely thickened appendix within the right lower quadrant of the abdomen. Findings may reflect treatment related changes. 3. Increased radiotracer uptake throughout the bone marrow which is favored to represent treatment related changes. 4. Lad coronary artery atherosclerotic calcifications. Electronically Signed   By: Kerby Moors M.D.   On: 06/09/2018 13:06   Dg Fluoro Guided Loc Of Needle/cath Tip For Spinal Inject Rt  Result Date: 06/13/2018 CLINICAL DATA:  Lymphoma. EXAM: FLUOROSCOPICALLY GUIDED LUMBAR PUNCTURE FOR INTRATHECAL CHEMOTHERAPY FLUOROSCOPY TIME:  0 minutes and 45 seconds. PROCEDURE: Informed consent was obtained from the patient prior to the procedure, including potential complications of headache, allergy, and pain. With the patient prone, the lower back was prepped with Betadine. 1% Lidocaine was used for local anesthesia. Lumbar puncture was performed at the L3-4 level using a 20 gauge needle with return of clearCSF. 10 cc of methotrexate (12 mg) and hydrocortisone (50 mg) was injected into the subarachnoid space. The patient tolerated the procedure well without apparent complication. IMPRESSION: Fluoroscopic guided lumbar puncture and subsequent intrathecal injection of chemotherapy. Electronically Signed   By: Marijo Sanes M.D.   On: 06/13/2018 12:32   Dg Fluoro Guide Lumbar Puncture  Result Date: 05/23/2018 CLINICAL DATA:  Lymphoma. EXAM: FLUOROSCOPICALLY GUIDED LUMBAR PUNCTURE FOR INTRATHECAL CHEMOTHERAPY TECHNIQUE: Informed  consent was obtained from the patient prior to the procedure, including potential complications of headache, allergy, and pain. A 'time out' was performed. With the patient prone, the lower back was prepped with Betadine. 1% Lidocaine was used for local anesthesia. Lumbar puncture was performed at the L3-4 using a 28 gauge needle with return of clear CSF. methotrexate was injected into the  subarachnoid space, as prepared by pharmacy. The patient tolerated the procedure well without apparent complication. FLUOROSCOPY TIME:  5 minutes and 0 seconds IMPRESSION: Intrathecal injection of chemotherapy without complication Electronically Signed   By: Abigail Miyamoto M.D.   On: 05/23/2018 16:29    ASSESSMENT & PLAN:  62 y.o. male with  1. 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 No BM or CNS involvement.   04/18/18 PET/CT revealed Continued improvement in right colonic wall thickening and surrounding bulky masses consistent with treated lymphoma. No hypermetabolic activity to suggest residual tumor. There is some hypermetabolic activity associated with the sigmoid colon which demonstrates mild wall thickening, surrounding inflammation and underlying diverticulosis, suggesting mild diverticulitis. Suspected treatment related changes throughout the bone marrow. There is mildly increased activity within the clivus without clear corresponding finding on the CT images. Attention on follow-up  recommended. Nonobstructing right renal calculi. 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   06/09/18 PET/CT revealed Soft tissue lesion within the ventral pelvis is again identified demonstrating mild to moderate increased uptake within SUV max of 5.61. Deauville criteria 4. Concerning for residual metabolically active tumor. 2. Similar appearance of diffusely thickened appendix within the right lower quadrant of the abdomen. Findings may reflect treatment related changes. 3. Increased radiotracer uptake throughout the bone marrow which is favored to represent treatment related changes. 4. Lad coronary artery atherosclerotic calcifications.   2. RLE DVT and b/l PE  3.s/pGI bleeding from lymphoma involving bowels-- resolved at this time -- will need to monitor with anticoagulation.  4. Normocytic anemia from chemotherapy. No evidence of overt bleeding. -monitoring PLAN: -outpatient Rituxan and neulasta on 06/19/2018 -Discussed pt labwork today, 06/13/18; blood counts and chemistries are stable  -The pt has no prohibitive toxicities from continuing C5D2 of EPOCH-R at this time.   -Pt received his fourth and last planned dose of IT MTX uneventfully today 06/13/18, and has remained flat for at least four hours -Lovenox was held from 06/11/18 for IT MTX and will return to the therapeutic dose tomorrow 06/14/18 -Continue with SCDs for DVT prophylaxis -Continue staying well hydrated, eating well, and staying as active as reasonably possible  -will consider switching to oral anticoagulation on discharge    4. DM2 with some increased hyperglycemia with steroids Plan -monitor  -continue Oral hypoglycemics and carb consistent diet at this time      The total time spent in the appt was 30 minutes and more than 50% was on counseling and direct patient cares.    Sullivan Lone MD MS AAHIVMS [facility] Hematology/Oncology Physician [facility]  (Office):       [phone] (Work cell):   [phone] (Fax):           [phone]  06/13/2018 4:03 PM  I, Baldwin [place], am acting as a scribe for [name]  .I have reviewed the above documentation for accuracy and completeness, and I agree with the above. Sullivan Lone MD MS

## 2018-06-13 NOTE — Progress Notes (Signed)
Inpatient Diabetes Program Recommendations  AACE/ADA: New Consensus Statement on Inpatient Glycemic Control (2015)  Target Ranges:  Prepandial:   less than 140 mg/dL      Peak postprandial:   less than 180 mg/dL (1-2 hours)      Critically ill patients:  140 - 180 mg/dL   Lab Results  Component Value Date   GLUCAP 173 (H) 06/09/2018   HGBA1C 5.4 03/10/2018     Review of Glycemic Control  Diabetes history: DM2 Outpatient Diabetes medications: Janumet 50-1000 mg BID Current orders for Inpatient glycemic control: Januvia 50 mg bid, Metformin 1000 mg BID  Inpatient Diabetes Program Recommendations: Correction (SSI): While inpatient, please consider ordering Novolog 0-9 units TID wtih meals and Novolog 0-5 units QHS.  Continue to follow.  Thank you. Lorenda Peck, RD, LDN, CDE Inpatient Diabetes Coordinator [phone]

## 2018-06-14 ENCOUNTER — Telehealth: Payer: Self-pay

## 2018-06-14 LAB — CBC
HCT: 27.8 % — ABNORMAL LOW (ref 39.0–52.0)
HEMOGLOBIN: 9 g/dL — AB (ref 13.0–17.0)
MCH: 31.3 pg (ref 26.0–34.0)
MCHC: 32.4 g/dL (ref 30.0–36.0)
MCV: 96.5 fL (ref 78.0–100.0)
Platelets: 441 10*3/uL — ABNORMAL HIGH (ref 150–400)
RBC: 2.88 MIL/uL — AB (ref 4.22–5.81)
RDW: 20 % — ABNORMAL HIGH (ref 11.5–15.5)
WBC: 6.8 10*3/uL (ref 4.0–10.5)

## 2018-06-14 LAB — BASIC METABOLIC PANEL
ANION GAP: 9 (ref 5–15)
BUN: 20 mg/dL (ref 8–23)
CHLORIDE: 107 mmol/L (ref 98–111)
CO2: 24 mmol/L (ref 22–32)
Calcium: 9.5 mg/dL (ref 8.9–10.3)
Creatinine, Ser: 0.82 mg/dL (ref 0.61–1.24)
GFR calc Af Amer: >60 mL/min (ref >60)
Glucose, Bld: 254 mg/dL — ABNORMAL HIGH (ref 70–99)
Potassium: 4.1 mmol/L (ref 3.5–5.1)
SODIUM: 140 mmol/L (ref 135–145)

## 2018-06-14 MED ORDER — SODIUM CHLORIDE 0.9 % IV SOLN
Freq: Once | INTRAVENOUS | Status: AC
  Administered 2018-06-14: 8 mg via INTRAVENOUS
  Filled 2018-06-14: qty 4

## 2018-06-14 MED ORDER — ENOXAPARIN SODIUM 150 MG/ML ~~LOC~~ SOLN
150.0000 mg | SUBCUTANEOUS | Status: DC
  Administered 2018-06-14 – 2018-06-15 (×2): 150 mg via SUBCUTANEOUS
  Filled 2018-06-14 (×2): qty 1

## 2018-06-14 MED ORDER — VINCRISTINE SULFATE CHEMO INJECTION 1 MG/ML
Freq: Once | INTRAVENOUS | Status: AC
  Administered 2018-06-14: 13:00:00 via INTRAVENOUS
  Filled 2018-06-14: qty 12

## 2018-06-14 NOTE — Progress Notes (Signed)
HEMATOLOGY/ONCOLOGY INPATIENT PROGRESS NOTE  Date of Service: 06/14/2018  Inpatient Attending: .[name], MD   SUBJECTIVE:   Mark Frey reports that he is doing well overall and had no concerns overnight.   The pt reports that he has not had any headaches or back pains. He was returned to [facility] as well. He notes that he has not had any constitutional symptoms.  Lab results today (06/14/18) of CBC and BMP is as follows: all values are WNL except for RBC at 2.88, HGB at 9.0, HCT at 27.8, RDW at 20.0, PLT at 441k, Glucose at 254.  On review of systems, pt reports stable energy levels, and denies headaches, back pains, fevers, chills, night sweats, mouth sores, and any other symptoms.    OBJECTIVE:  NAD  PHYSICAL EXAMINATION: . Vitals:   06/13/18 1325 06/13/18 2210 06/14/18 0529 06/14/18 1343  BP: 124/64 130/87 (!) 143/88 139/76  Pulse: (!) 113 (!) 102 96 (!) 102  Resp: 16 20 18 15   Temp: 99.4 F (37.4 C) 98.1 F (36.7 C) 97.6 F (36.4 C) 98.1 F (36.7 C)  TempSrc: Oral Oral Oral Oral  SpO2: 98% 97% 100% 99%  Weight:      Height:       Filed Weights   06/12/18 1056  Weight: 226 lb 6.6 oz (102.7 kg)   .Body mass index is 33.44 kg/m.  GENERAL:alert, in no acute distress and comfortable SKIN: no acute rashes, no significant lesions EYES: conjunctiva are pink and non-injected, sclera anicteric OROPHARYNX: MMM, no exudates, no oropharyngeal erythema or ulceration NECK: supple, no JVD LYMPH:  no palpable lymphadenopathy in the cervical, axillary or inguinal regions LUNGS: clear to auscultation b/l with normal respiratory effort HEART: regular rate & rhythm ABDOMEN:  normoactive bowel sounds , non tender, not distended. No palpable hepatosplenomegaly.  Extremity: 1+ right pedal edema, trace left pedal edema  PSYCH: alert & oriented x 3 with fluent speech NEURO: no focal motor/sensory deficits    MEDICAL HISTORY:  Past Medical History:  Diagnosis  Date  . ALLERGIC RHINITIS   . Diabetes mellitus   . Hyperlipidemia     SURGICAL HISTORY: Past Surgical History:  Procedure Laterality Date  . BIOPSY  03/15/2018   Procedure: BIOPSY;  Surgeon: Milus Banister, MD;  Location: [facility];  Service: Endoscopy;;  . CLEFT PALATE REPAIR    . COLONOSCOPY N/A 03/15/2018   Procedure: COLONOSCOPY;  Surgeon: Milus Banister, MD;  Location: [facility];  Service: Endoscopy;  Laterality: N/A;  . deviated septum repair     slight improvement  . ESOPHAGOGASTRODUODENOSCOPY N/A 03/15/2018   Procedure: ESOPHAGOGASTRODUODENOSCOPY (EGD);  Surgeon: Milus Banister, MD;  Location: [name];  Service: Endoscopy;  Laterality: N/A;  . IR IMAGING GUIDED PORT INSERTION  03/17/2018  . TONSILLECTOMY      SOCIAL HISTORY: Social History   Socioeconomic History  . Marital status: Married    Spouse name: lisa  . Number of children: 0  . Years of education: Not on file  . Highest education level: Not on file  Occupational History  . Not on file  Social Needs  . Financial resource strain: Not hard at all  . Food insecurity:    Worry: Never true    Inability: Never true  . Transportation needs:    Medical: No    Non-medical: No  Tobacco Use  . Smoking status: Never Smoker  . Smokeless tobacco: Never Used  Substance and Sexual Activity  . Alcohol  use: Yes    Comment: occasional  . Drug use: No  . Sexual activity: Yes  Lifestyle  . Physical activity:    Days per week: 0 days    Minutes per session: 0 min  . Stress: Not at all  Relationships  . Social connections:    Talks on phone: More than three times a week    Gets together: More than three times a week    Attends religious service: 1 to 4 times per year    Active member of club or organization: No    Attends meetings of clubs or organizations: Never    Relationship status: Married  . Intimate partner violence:    Fear of current or ex partner: No    Emotionally abused: No     Physically abused: No    Forced sexual activity: No  Other Topics Concern  . Not on file  Social History Narrative   Married 1985. No kids. 4 small dogs.       Works in Financial trader, residential      Hobbies: work on cars, Haematologist, exercise as able    FAMILY HISTORY: Family History  Problem Relation Age of Onset  . Lung cancer Mother        smoker  . Brain cancer Mother        metastasis  . AAA (abdominal aortic aneurysm) Father        smoker    ALLERGIES:  is allergic to ciprofloxacin.  MEDICATIONS:  Scheduled Meds: . DOXOrubicin/vinCRIStine/etoposide CHEMO IV infusion for Inpatient CI   Intravenous Once  . enoxaparin (LOVENOX) injection  150 mg Subcutaneous Q24H  . furosemide  20 mg Oral Daily  . sitaGLIPtin  50 mg Oral BID WC   And  . metFORMIN  1,000 mg Oral BID WC  . predniSONE  30 mg Oral BID WC   Continuous Infusions: . sodium chloride 20 mL/hr at 06/13/18 0459   PRN Meds:.alteplase, Cold Pack, heparin lock flush, heparin lock flush, Hot Pack, lidocaine-prilocaine, senna-docusate, sodium chloride flush, sodium chloride flush, sodium chloride flush  REVIEW OF SYSTEMS:    A 10+ POINT REVIEW OF SYSTEMS WAS OBTAINED including neurology, dermatology, psychiatry, cardiac, respiratory, lymph, extremities, GI, GU, Musculoskeletal, constitutional, breasts, reproductive, HEENT.  All pertinent positives are noted in the HPI.  All others are negative.   LABORATORY DATA:  I have reviewed the data as listed  . CBC Latest Ref Rng & Units 06/14/2018 06/13/2018 06/12/2018  WBC 4.0 - 10.5 K/uL 6.8 9.8 6.6  Hemoglobin 13.0 - 17.0 g/dL 9.0(L) 9.4(L) 9.2(L)  Hematocrit 39.0 - 52.0 % 27.8(L) 29.6(L) 28.9(L)  Platelets 150 - 400 K/uL 441(H) 434(H) 409(H)    . CMP Latest Ref Rng & Units 06/14/2018 06/13/2018 06/12/2018  Glucose 70 - 99 mg/dL 254(H) 272(H) 143(H)  BUN 8 - 23 mg/dL 20 14 12   Creatinine 0.61 - 1.24 mg/dL 0.82 0.67 0.77  Sodium 135 - 145 mmol/L 140 139 144   Potassium 3.5 - 5.1 mmol/L 4.1 4.7 4.1  Chloride 98 - 111 mmol/L 107 109 107  CO2 22 - 32 mmol/L 24 23 27   Calcium 8.9 - 10.3 mg/dL 9.5 9.6 10.0  Total Protein 6.5 - 8.1 g/dL - - 6.4(L)  Total Bilirubin 0.3 - 1.2 mg/dL - - 0.3  Alkaline Phos 38 - 126 U/L - - 52  AST 15 - 41 U/L - - 20  ALT 0 - 44 U/L - - 18    RADIOGRAPHIC STUDIES:  I have personally reviewed the radiological images as listed and agreed with the findings in the report.  Nm Pet Image Restag (ps) Skull Base To Thigh  Result Date: 06/09/2018 CLINICAL DATA:  Subsequent treatment strategy for lymphoma. EXAM: NUCLEAR MEDICINE PET SKULL BASE TO THIGH TECHNIQUE: 11.15 mCi F-18 FDG was injected intravenously. Full-ring PET imaging was performed from the skull base to thigh after the radiotracer. CT data was obtained and used for attenuation correction and anatomic localization. Fasting blood glucose: 173 mg/dl COMPARISON:  04/18/2018 FINDINGS: Mediastinal blood pool activity: SUV max 2.69 Liver activity: SUV max 3.47 NECK: No hypermetabolic lymph nodes in the neck. Incidental CT findings: none CHEST: No hypermetabolic mediastinal or hilar nodes. No suspicious pulmonary nodules on the CT scan. Incidental CT findings: Lad coronary artery calcification noted. ABDOMEN/PELVIS: No abnormal hypermetabolic activity within the liver, pancreas, adrenal glands, or spleen. No hypermetabolic lymph nodes in the abdomen or pelvis. Within the ventral aspect of the pelvis there is a soft tissue nodule with surrounding fat stranding measuring 2.7 by 2.1 cm within SUV max of 5.61, image 173/4. Deauville criteria 4. Previously this measured 2.8 x 2.6 cm within SUV max of 3.8. Incidental CT findings: The appendix is again noted and appears diffusely thickened measuring 1.3 cm in maximum thickness. Gas noted within the lumen of the appendix suggesting patency. SKELETON: No focal hypermetabolic activity to suggest skeletal metastasis.There is diffusely increased  radiotracer activity throughout the axial and appendicular skeleton which is favored to represent treatment related changes Incidental CT findings: None IMPRESSION: 1. Soft tissue lesion within the ventral pelvis is again identified demonstrating mild to moderate increased uptake within SUV max of 5.61. Deauville criteria 4. Concerning for residual metabolically active tumor. 2. Similar appearance of diffusely thickened appendix within the right lower quadrant of the abdomen. Findings may reflect treatment related changes. 3. Increased radiotracer uptake throughout the bone marrow which is favored to represent treatment related changes. 4. Lad coronary artery atherosclerotic calcifications. Electronically Signed   By: Kerby Moors M.D.   On: 06/09/2018 13:06   Dg Fluoro Guided Loc Of Needle/cath Tip For Spinal Inject Rt  Result Date: 06/13/2018 CLINICAL DATA:  Lymphoma. EXAM: FLUOROSCOPICALLY GUIDED LUMBAR PUNCTURE FOR INTRATHECAL CHEMOTHERAPY FLUOROSCOPY TIME:  0 minutes and 45 seconds. PROCEDURE: Informed consent was obtained from the patient prior to the procedure, including potential complications of headache, allergy, and pain. With the patient prone, the lower back was prepped with Betadine. 1% Lidocaine was used for local anesthesia. Lumbar puncture was performed at the L3-4 level using a 20 gauge needle with return of clearCSF. 10 cc of methotrexate (12 mg) and hydrocortisone (50 mg) was injected into the subarachnoid space. The patient tolerated the procedure well without apparent complication. IMPRESSION: Fluoroscopic guided lumbar puncture and subsequent intrathecal injection of chemotherapy. Electronically Signed   By: Marijo Sanes M.D.   On: 06/13/2018 12:32   Dg Fluoro Guide Lumbar Puncture  Result Date: 05/23/2018 CLINICAL DATA:  Lymphoma. EXAM: FLUOROSCOPICALLY GUIDED LUMBAR PUNCTURE FOR INTRATHECAL CHEMOTHERAPY TECHNIQUE: Informed consent was obtained from the patient prior to the  procedure, including potential complications of headache, allergy, and pain. A 'time out' was performed. With the patient prone, the lower back was prepped with Betadine. 1% Lidocaine was used for local anesthesia. Lumbar puncture was performed at the L3-4 using a 28 gauge needle with return of clear CSF. methotrexate was injected into the subarachnoid space, as prepared by pharmacy. The patient tolerated the procedure well without apparent complication. FLUOROSCOPY  TIME:  5 minutes and 0 seconds IMPRESSION: Intrathecal injection of chemotherapy without complication Electronically Signed   By: Abigail Miyamoto M.D.   On: 05/23/2018 16:29    ASSESSMENT & PLAN:  61 y.o. male with  1. 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 No BM or CNS involvement.   04/18/18 PET/CT revealed Continued improvement in right colonic wall thickening and surrounding bulky masses consistent with treated lymphoma. No hypermetabolic activity to suggest residual tumor. There is some hypermetabolic activity associated with the sigmoid colon which demonstrates mild wall thickening, surrounding inflammation and underlying diverticulosis, suggesting mild diverticulitis. Suspected treatment related changes throughout the bone marrow. There is mildly increased activity within the clivus without clear corresponding finding on the CT images. Attention on follow-up recommended. Nonobstructing right renal calculi.  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   06/09/18 PET/CT revealed Soft tissue lesion within the ventral pelvis is again identified demonstrating mild to moderate increased uptake within SUV max of 5.61. Deauville criteria 4. Concerning for residual metabolically active tumor. 2. Similar appearance of diffusely thickened appendix within the right lower quadrant of the abdomen. Findings may reflect treatment related changes. 3. Increased radiotracer uptake throughout the bone marrow which is favored to represent treatment related changes. 4. Lad coronary artery atherosclerotic calcifications.   2. RLE DVT and b/l PE  3.s/pGI bleeding from lymphoma involving bowels-- resolved at this time -- will need to monitor with anticoagulation.  PLAN: -Discussed pt labwork today, 06/14/18; blood counts and chemistries are stable  -The pt has no prohibitive toxicities from continuing C5D3 EPOCH-R at this time.  -outpatient Rituxan and neulasta on 06/19/2018 -Pt received his fourth dose of IT MTX uneventfully yesterday 06/13/18 -Restart therapeutic dose Lovenox tonight 06/14/18 at 6pm  -Will discharge pt with Eliquis BID instead of lovenox since he shall not be receiving any planned lumbar puncture from this point on. -Continue with SCDs for DVT prophylaxis     4. DM2 with some increased hyperglycemia with steroids Plan -monitor  -continue Oral hypoglycemics and carb consistent diet at this time     The total time spent in the appt was 25 minutes and more than 50% was on counseling and direct patient cares.     Sullivan Lone MD MS AAHIVMS [facility] Hematology/Oncology Physician [facility]  (Office):       [phone] (Work cell):  [phone] (Fax):           [phone]  06/14/2018 3:49 PM  I, Baldwin [place], am acting as a scribe for [name]  .I have reviewed the above documentation for accuracy and completeness, and I agree with the above. Sullivan Lone MD MS

## 2018-06-14 NOTE — Telephone Encounter (Signed)
Verified appointment with patient. Per 9/24 los added

## 2018-06-15 LAB — CBC
HCT: 28.1 % — ABNORMAL LOW (ref 39.0–52.0)
HEMOGLOBIN: 9 g/dL — AB (ref 13.0–17.0)
MCH: 30.6 pg (ref 26.0–34.0)
MCHC: 32 g/dL (ref 30.0–36.0)
MCV: 95.6 fL (ref 78.0–100.0)
PLATELETS: 454 10*3/uL — AB (ref 150–400)
RBC: 2.94 MIL/uL — ABNORMAL LOW (ref 4.22–5.81)
RDW: 19.3 % — ABNORMAL HIGH (ref 11.5–15.5)
WBC: 5.4 10*3/uL (ref 4.0–10.5)

## 2018-06-15 LAB — BASIC METABOLIC PANEL
Anion gap: 11 (ref 5–15)
BUN: 21 mg/dL (ref 8–23)
CHLORIDE: 104 mmol/L (ref 98–111)
CO2: 23 mmol/L (ref 22–32)
Calcium: 9.2 mg/dL (ref 8.9–10.3)
Creatinine, Ser: 0.71 mg/dL (ref 0.61–1.24)
GFR calc Af Amer: >60 mL/min (ref >60)
GFR calc non Af Amer: >60 mL/min (ref >60)
GLUCOSE: 251 mg/dL — AB (ref 70–99)
Potassium: 4 mmol/L (ref 3.5–5.1)
SODIUM: 138 mmol/L (ref 135–145)

## 2018-06-15 MED ORDER — VINCRISTINE SULFATE CHEMO INJECTION 1 MG/ML
Freq: Once | INTRAVENOUS | Status: AC
  Administered 2018-06-15: 11:00:00 via INTRAVENOUS
  Filled 2018-06-15: qty 12

## 2018-06-15 MED ORDER — SODIUM CHLORIDE 0.9 % IV SOLN
Freq: Once | INTRAVENOUS | Status: AC
  Administered 2018-06-15: 8 mg via INTRAVENOUS
  Filled 2018-06-15: qty 4

## 2018-06-15 NOTE — Progress Notes (Signed)
Dosing for doxorubicin/etoposide/oncovin verified with Clotilde Dieter, RN.

## 2018-06-15 NOTE — Progress Notes (Signed)
HEMATOLOGY/ONCOLOGY INPATIENT PROGRESS NOTE  Date of Service: 06/15/2018  Inpatient Attending: .[name], MD   SUBJECTIVE:   Mark Frey reports that he is doing well overall.   The pt reports that he hasn't developed any concerns. He has been eating well, endorses good energy levels, and has been walking around.  Lab results today (06/15/18) of CBC and BMP is as follows: all values are WNL except for RBC at 2.94, HGB at 9.0, HCT at 28.1, RDW at 19.3, PLT at 454k, Glucose at 251.  On review of systems, pt reports eating well, good energy levels, walking around, moving his bowels, and denies nausea, vomiting, abdominal pains, blood in the stools, black stools, and any other symptoms.    OBJECTIVE:  NAD  PHYSICAL EXAMINATION: . Vitals:   06/14/18 1343 06/14/18 1938 06/15/18 0523 06/15/18 1401  BP: 139/76 132/77 137/90 126/77  Pulse: (!) 102 94 90 (!) 117  Resp: 15 17 17 18   Temp: 98.1 F (36.7 C) 97.9 F (36.6 C) 98.3 F (36.8 C) 97.7 F (36.5 C)  TempSrc: Oral Oral Oral Oral  SpO2: 99% 98% 100% 97%  Weight:      Height:       Filed Weights   06/12/18 1056  Weight: 226 lb 6.6 oz (102.7 kg)   .Body mass index is 33.44 kg/m.  GENERAL:alert, in no acute distress and comfortable SKIN: no acute rashes, no significant lesions EYES: conjunctiva are pink and non-injected, sclera anicteric OROPHARYNX: MMM, no exudates, no oropharyngeal erythema or ulceration NECK: supple, no JVD LYMPH:  no palpable lymphadenopathy in the cervical, axillary or inguinal regions LUNGS: clear to auscultation b/l with normal respiratory effort HEART: regular rate & rhythm ABDOMEN:  normoactive bowel sounds , non tender, not distended. No palpable hepatosplenomegaly.  Extremity: 1+ right pedal edema, trace left pedal edema PSYCH: alert & oriented x 3 with fluent speech NEURO: no focal motor/sensory deficits    MEDICAL HISTORY:  Past Medical History:  Diagnosis Date  .  ALLERGIC RHINITIS   . Diabetes mellitus   . Hyperlipidemia     SURGICAL HISTORY: Past Surgical History:  Procedure Laterality Date  . BIOPSY  03/15/2018   Procedure: BIOPSY;  Surgeon: Milus Banister, MD;  Location: [facility];  Service: Endoscopy;;  . CLEFT PALATE REPAIR    . COLONOSCOPY N/A 03/15/2018   Procedure: COLONOSCOPY;  Surgeon: Milus Banister, MD;  Location: [facility];  Service: Endoscopy;  Laterality: N/A;  . deviated septum repair     slight improvement  . ESOPHAGOGASTRODUODENOSCOPY N/A 03/15/2018   Procedure: ESOPHAGOGASTRODUODENOSCOPY (EGD);  Surgeon: Milus Banister, MD;  Location: [name];  Service: Endoscopy;  Laterality: N/A;  . IR IMAGING GUIDED PORT INSERTION  03/17/2018  . TONSILLECTOMY      SOCIAL HISTORY: Social History   Socioeconomic History  . Marital status: Married    Spouse name: lisa  . Number of children: 0  . Years of education: Not on file  . Highest education level: Not on file  Occupational History  . Not on file  Social Needs  . Financial resource strain: Not hard at all  . Food insecurity:    Worry: Never true    Inability: Never true  . Transportation needs:    Medical: No    Non-medical: No  Tobacco Use  . Smoking status: Never Smoker  . Smokeless tobacco: Never Used  Substance and Sexual Activity  . Alcohol use: Yes    Comment: occasional  .  Drug use: No  . Sexual activity: Yes  Lifestyle  . Physical activity:    Days per week: 0 days    Minutes per session: 0 min  . Stress: Not at all  Relationships  . Social connections:    Talks on phone: More than three times a week    Gets together: More than three times a week    Attends religious service: 1 to 4 times per year    Active member of club or organization: No    Attends meetings of clubs or organizations: Never    Relationship status: Married  . Intimate partner violence:    Fear of current or ex partner: No    Emotionally abused: No    Physically abused:  No    Forced sexual activity: No  Other Topics Concern  . Not on file  Social History Narrative   Married 1985. No kids. 4 small dogs.       Works in Financial trader, residential      Hobbies: work on cars, Haematologist, exercise as able    FAMILY HISTORY: Family History  Problem Relation Age of Onset  . Lung cancer Mother        smoker  . Brain cancer Mother        metastasis  . AAA (abdominal aortic aneurysm) Father        smoker    ALLERGIES:  is allergic to ciprofloxacin.  MEDICATIONS:  Scheduled Meds: . DOXOrubicin/vinCRIStine/etoposide CHEMO IV infusion for Inpatient CI   Intravenous Once  . enoxaparin (LOVENOX) injection  150 mg Subcutaneous Q24H  . furosemide  20 mg Oral Daily  . sitaGLIPtin  50 mg Oral BID WC   And  . metFORMIN  1,000 mg Oral BID WC  . predniSONE  30 mg Oral BID WC   Continuous Infusions: . sodium chloride 20 mL/hr at 06/13/18 0459   PRN Meds:.alteplase, Cold Pack, heparin lock flush, heparin lock flush, Hot Pack, lidocaine-prilocaine, senna-docusate, sodium chloride flush, sodium chloride flush, sodium chloride flush  REVIEW OF SYSTEMS:    A 10+ POINT REVIEW OF SYSTEMS WAS OBTAINED including neurology, dermatology, psychiatry, cardiac, respiratory, lymph, extremities, GI, GU, Musculoskeletal, constitutional, breasts, reproductive, HEENT.  All pertinent positives are noted in the HPI.  All others are negative.   LABORATORY DATA:  I have reviewed the data as listed  . CBC Latest Ref Rng & Units 06/15/2018 06/14/2018 06/13/2018  WBC 4.0 - 10.5 K/uL 5.4 6.8 9.8  Hemoglobin 13.0 - 17.0 g/dL 9.0(L) 9.0(L) 9.4(L)  Hematocrit 39.0 - 52.0 % 28.1(L) 27.8(L) 29.6(L)  Platelets 150 - 400 K/uL 454(H) 441(H) 434(H)    . CMP Latest Ref Rng & Units 06/15/2018 06/14/2018 06/13/2018  Glucose 70 - 99 mg/dL 251(H) 254(H) 272(H)  BUN 8 - 23 mg/dL 21 20 14   Creatinine 0.61 - 1.24 mg/dL 0.71 0.82 0.67  Sodium 135 - 145 mmol/L 138 140 139  Potassium 3.5 -  5.1 mmol/L 4.0 4.1 4.7  Chloride 98 - 111 mmol/L 104 107 109  CO2 22 - 32 mmol/L 23 24 23   Calcium 8.9 - 10.3 mg/dL 9.2 9.5 9.6  Total Protein 6.5 - 8.1 g/dL - - -  Total Bilirubin 0.3 - 1.2 mg/dL - - -  Alkaline Phos 38 - 126 U/L - - -  AST 15 - 41 U/L - - -  ALT 0 - 44 U/L - - -    RADIOGRAPHIC STUDIES: I have personally reviewed the radiological images as listed  and agreed with the findings in the report.  Nm Pet Image Restag (ps) Skull Base To Thigh  Result Date: 06/09/2018 CLINICAL DATA:  Subsequent treatment strategy for lymphoma. EXAM: NUCLEAR MEDICINE PET SKULL BASE TO THIGH TECHNIQUE: 11.15 mCi F-18 FDG was injected intravenously. Full-ring PET imaging was performed from the skull base to thigh after the radiotracer. CT data was obtained and used for attenuation correction and anatomic localization. Fasting blood glucose: 173 mg/dl COMPARISON:  04/18/2018 FINDINGS: Mediastinal blood pool activity: SUV max 2.69 Liver activity: SUV max 3.47 NECK: No hypermetabolic lymph nodes in the neck. Incidental CT findings: none CHEST: No hypermetabolic mediastinal or hilar nodes. No suspicious pulmonary nodules on the CT scan. Incidental CT findings: Lad coronary artery calcification noted. ABDOMEN/PELVIS: No abnormal hypermetabolic activity within the liver, pancreas, adrenal glands, or spleen. No hypermetabolic lymph nodes in the abdomen or pelvis. Within the ventral aspect of the pelvis there is a soft tissue nodule with surrounding fat stranding measuring 2.7 by 2.1 cm within SUV max of 5.61, image 173/4. Deauville criteria 4. Previously this measured 2.8 x 2.6 cm within SUV max of 3.8. Incidental CT findings: The appendix is again noted and appears diffusely thickened measuring 1.3 cm in maximum thickness. Gas noted within the lumen of the appendix suggesting patency. SKELETON: No focal hypermetabolic activity to suggest skeletal metastasis.There is diffusely increased radiotracer activity  throughout the axial and appendicular skeleton which is favored to represent treatment related changes Incidental CT findings: None IMPRESSION: 1. Soft tissue lesion within the ventral pelvis is again identified demonstrating mild to moderate increased uptake within SUV max of 5.61. Deauville criteria 4. Concerning for residual metabolically active tumor. 2. Similar appearance of diffusely thickened appendix within the right lower quadrant of the abdomen. Findings may reflect treatment related changes. 3. Increased radiotracer uptake throughout the bone marrow which is favored to represent treatment related changes. 4. Lad coronary artery atherosclerotic calcifications. Electronically Signed   By: Kerby Moors M.D.   On: 06/09/2018 13:06   Dg Fluoro Guided Loc Of Needle/cath Tip For Spinal Inject Rt  Result Date: 06/13/2018 CLINICAL DATA:  Lymphoma. EXAM: FLUOROSCOPICALLY GUIDED LUMBAR PUNCTURE FOR INTRATHECAL CHEMOTHERAPY FLUOROSCOPY TIME:  0 minutes and 45 seconds. PROCEDURE: Informed consent was obtained from the patient prior to the procedure, including potential complications of headache, allergy, and pain. With the patient prone, the lower back was prepped with Betadine. 1% Lidocaine was used for local anesthesia. Lumbar puncture was performed at the L3-4 level using a 20 gauge needle with return of clearCSF. 10 cc of methotrexate (12 mg) and hydrocortisone (50 mg) was injected into the subarachnoid space. The patient tolerated the procedure well without apparent complication. IMPRESSION: Fluoroscopic guided lumbar puncture and subsequent intrathecal injection of chemotherapy. Electronically Signed   By: Marijo Sanes M.D.   On: 06/13/2018 12:32   Dg Fluoro Guide Lumbar Puncture  Result Date: 05/23/2018 CLINICAL DATA:  Lymphoma. EXAM: FLUOROSCOPICALLY GUIDED LUMBAR PUNCTURE FOR INTRATHECAL CHEMOTHERAPY TECHNIQUE: Informed consent was obtained from the patient prior to the procedure, including  potential complications of headache, allergy, and pain. A 'time out' was performed. With the patient prone, the lower back was prepped with Betadine. 1% Lidocaine was used for local anesthesia. Lumbar puncture was performed at the L3-4 using a 28 gauge needle with return of clear CSF. methotrexate was injected into the subarachnoid space, as prepared by pharmacy. The patient tolerated the procedure well without apparent complication. FLUOROSCOPY TIME:  5 minutes and 0 seconds IMPRESSION: Intrathecal  injection of chemotherapy without complication Electronically Signed   By: Abigail Miyamoto M.D.   On: 05/23/2018 16:29    ASSESSMENT & PLAN:  62 y.o. male with  1. 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 No BM or CNS involvement.   04/18/18 PET/CT revealed Continued improvement in right colonic wall thickening and surrounding bulky masses consistent with treated lymphoma. No hypermetabolic activity to suggest residual tumor. There is some hypermetabolic activity associated with the sigmoid colon which demonstrates mild wall thickening, surrounding inflammation and underlying diverticulosis, suggesting mild diverticulitis. Suspected treatment related changes throughout the bone marrow. There is mildly increased activity within the clivus without clear corresponding finding on the CT images. Attention on follow-up recommended. Nonobstructing right renal calculi. Known femoral vein DVT  on the right.   06/09/18 PET/CT revealed Soft tissue lesion within the ventral pelvis is again identified demonstrating mild to moderate increased uptake within SUV max of 5.61. Deauville criteria 4. Concerning for residual metabolically active tumor. 2. Similar appearance of diffusely thickened appendix within the right lower quadrant of the abdomen. Findings may reflect treatment related changes. 3. Increased radiotracer uptake throughout the bone marrow which is favored to represent treatment related changes. 4. Lad coronary artery atherosclerotic calcifications.   2. RLE DVT and b/l PE  3.s/pGI bleeding from lymphoma involving bowels-- resolved at this time -- will need to monitor with anticoagulation.  PLAN:  -Discussed pt labwork today, 06/15/18; blood counts and chemistries are stable -The pt has no prohibitive toxicities from continuing C5D4 EPOCH-R at this time. -outpatient Rituxan and neulasta on 06/19/2018 -Pt received his fourth dose of IT MTX uneventfully 06/13/18  -Will discharge pt with Eliquis BID instead of lovenox since he shall not be receiving any planned lumbar puncture from this point on. -Continue with SCDs for DVT prophylaxis-Continue eating well, staying hydrated, and walking around as much as reasonably possible       4. DM2 with some increased hyperglycemia with steroids Plan -monitor  -continue Oral hypoglycemics and carb consistent diet at this time     The total time spent in the appt was 25 minutes and more than 50% was on counseling and direct patient cares.     Sullivan Lone MD MS AAHIVMS [facility] Hematology/Oncology Physician [facility]  (Office):       [phone] (Work cell):  [phone] (Fax):           [phone]  06/15/2018 5:27 PM I, Baldwin [place], am acting as a scribe for [name]  .I have reviewed the above documentation for accuracy and completeness, and I agree with the above. Sullivan Lone MD MS

## 2018-06-16 DIAGNOSIS — E1169 Type 2 diabetes mellitus with other specified complication: Secondary | ICD-10-CM

## 2018-06-16 LAB — BASIC METABOLIC PANEL
Anion gap: 10 (ref 5–15)
BUN: 20 mg/dL (ref 8–23)
CALCIUM: 9.6 mg/dL (ref 8.9–10.3)
CO2: 26 mmol/L (ref 22–32)
CREATININE: 0.75 mg/dL (ref 0.61–1.24)
Chloride: 103 mmol/L (ref 98–111)
GFR calc non Af Amer: >60 mL/min (ref >60)
Glucose, Bld: 221 mg/dL — ABNORMAL HIGH (ref 70–99)
Potassium: 3.7 mmol/L (ref 3.5–5.1)
Sodium: 139 mmol/L (ref 135–145)

## 2018-06-16 LAB — CBC
HEMATOCRIT: 26.2 % — AB (ref 39.0–52.0)
Hemoglobin: 8.8 g/dL — ABNORMAL LOW (ref 13.0–17.0)
MCH: 31.3 pg (ref 26.0–34.0)
MCHC: 33.6 g/dL (ref 30.0–36.0)
MCV: 93.2 fL (ref 78.0–100.0)
Platelets: 389 10*3/uL (ref 150–400)
RBC: 2.81 MIL/uL — ABNORMAL LOW (ref 4.22–5.81)
RDW: 18.5 % — AB (ref 11.5–15.5)
WBC: 4.5 10*3/uL (ref 4.0–10.5)

## 2018-06-16 MED ORDER — SODIUM CHLORIDE 0.9 % IV SOLN
Freq: Once | INTRAVENOUS | Status: AC
  Administered 2018-06-16: 36 mg via INTRAVENOUS
  Filled 2018-06-16: qty 8

## 2018-06-16 MED ORDER — APIXABAN 5 MG PO TABS: 5.0000 mg | ORAL_TABLET | Freq: Two times a day (BID) | ORAL | 3 refills | Status: DC

## 2018-06-16 MED ORDER — SODIUM CHLORIDE 0.9 % IV SOLN
750.0000 mg/m2 | Freq: Once | INTRAVENOUS | Status: AC
  Administered 2018-06-16: 1800 mg via INTRAVENOUS
  Filled 2018-06-16: qty 90

## 2018-06-16 NOTE — Progress Notes (Signed)
Mark Frey  [phone] [phone]    Physician Discharge Summary     Patient ID: Mark Frey MRN: [id number] DOB/AGE: 1956-06-13 62 y.o.  Admit date: 06/12/2018 Discharge date: 06/16/2018  Primary Care Physician:  [name], MD   Discharge Diagnoses:    Present on Admission: . Burkitt lymphoma, lymph nodes of multiple sites [facility])   Discharge Medications:  Allergies as of 06/16/2018      Reactions   Ciprofloxacin Other (See Comments)   Leg tingling      Medication List    STOP taking these medications   enoxaparin 150 MG/ML injection Commonly known as:  LOVENOX     TAKE these medications   apixaban 5 MG Tabs tablet Commonly known as:  ELIQUIS Take 1 tablet (5 mg total) by mouth 2 (two) times daily.   blood glucose meter kit and supplies Dispense based on patient and insurance preference. Use daily as directed. (E11.9).   Choline Fenofibrate 135 MG capsule TAKE 1 CAPSULE DAILY What changed:  when to take this   furosemide 20 MG tablet Commonly known as:  LASIX TAKE 1 TABLET BY MOUTH DAILY HOLD AFTER 10-15 DAYS IF LEG SWELLING RESOLVED OR IF >10LBS WEIGHT LOSS   glucose blood test strip Use to check blood sugar daily   JANUMET 50-1000 MG tablet Generic drug:  sitaGLIPtin-metformin TAKE 1 TABLET TWICE DAILY  WITH MEALS   lidocaine-prilocaine cream Commonly known as:  EMLA Apply 1 application topically as needed. What changed:    when to take this  reasons to take this   loperamide 2 MG tablet Commonly known as:  IMODIUM A-D Take 2 mg by mouth 4 (four) times daily as needed for diarrhea or loose stools.   ondansetron 8 MG tablet Commonly known as:  ZOFRAN Take 1 tablet (8 mg total) by mouth every 8 (eight) hours as needed for nausea or vomiting.   senna-docusate 8.6-50 MG tablet Commonly known as:  Senokot-S Take 2 tablets by mouth at bedtime. What changed:    when to take  this  reasons to take this        Disposition and Follow-up:   Significant Diagnostic Studies:  Nm Pet Image Restag (ps) Skull Base To Thigh  Result Date: 06/09/2018 CLINICAL DATA:  Subsequent treatment strategy for lymphoma. EXAM: NUCLEAR MEDICINE PET SKULL BASE TO THIGH TECHNIQUE: 11.15 mCi F-18 FDG was injected intravenously. Full-ring PET imaging was performed from the skull base to thigh after the radiotracer. CT data was obtained and used for attenuation correction and anatomic localization. Fasting blood glucose: 173 mg/dl COMPARISON:  04/18/2018 FINDINGS: Mediastinal blood pool activity: SUV max 2.69 Liver activity: SUV max 3.47 NECK: No hypermetabolic lymph nodes in the neck. Incidental CT findings: none CHEST: No hypermetabolic mediastinal or hilar nodes. No suspicious pulmonary nodules on the CT scan. Incidental CT findings: Lad coronary artery calcification noted. ABDOMEN/PELVIS: No abnormal hypermetabolic activity within the liver, pancreas, adrenal glands, or spleen. No hypermetabolic lymph nodes in the abdomen or pelvis. Within the ventral aspect of the pelvis there is a soft tissue nodule with surrounding fat stranding measuring 2.7 by 2.1 cm within SUV max of 5.61, image 173/4. Deauville criteria 4. Previously this measured 2.8 x 2.6 cm within SUV max of 3.8. Incidental CT findings: The appendix is again noted and appears diffusely thickened measuring 1.3 cm in maximum thickness. Gas noted within the lumen of the appendix suggesting patency. SKELETON: No focal hypermetabolic activity to suggest  skeletal metastasis.There is diffusely increased radiotracer activity throughout the axial and appendicular skeleton which is favored to represent treatment related changes Incidental CT findings: None IMPRESSION: 1. Soft tissue lesion within the ventral pelvis is again identified demonstrating mild to moderate increased uptake within SUV max of 5.61. Deauville criteria 4. Concerning for residual  metabolically active tumor. 2. Similar appearance of diffusely thickened appendix within the right lower quadrant of the abdomen. Findings may reflect treatment related changes. 3. Increased radiotracer uptake throughout the bone marrow which is favored to represent treatment related changes. 4. Lad coronary artery atherosclerotic calcifications. Electronically Signed   By: Kerby Moors M.D.   On: 06/09/2018 13:06   Dg Fluoro Guided Loc Of Needle/cath Tip For Spinal Inject Rt  Result Date: 06/13/2018 CLINICAL DATA:  Lymphoma. EXAM: FLUOROSCOPICALLY GUIDED LUMBAR PUNCTURE FOR INTRATHECAL CHEMOTHERAPY FLUOROSCOPY TIME:  0 minutes and 45 seconds. PROCEDURE: Informed consent was obtained from the patient prior to the procedure, including potential complications of headache, allergy, and pain. With the patient prone, the lower back was prepped with Betadine. 1% Lidocaine was used for local anesthesia. Lumbar puncture was performed at the L3-4 level using a 20 gauge needle with return of clearCSF. 10 cc of methotrexate (12 mg) and hydrocortisone (50 mg) was injected into the subarachnoid space. The patient tolerated the procedure well without apparent complication. IMPRESSION: Fluoroscopic guided lumbar puncture and subsequent intrathecal injection of chemotherapy. Electronically Signed   By: Marijo Sanes M.D.   On: 06/13/2018 12:32   Dg Fluoro Guide Lumbar Puncture  Result Date: 05/23/2018 CLINICAL DATA:  Lymphoma. EXAM: FLUOROSCOPICALLY GUIDED LUMBAR PUNCTURE FOR INTRATHECAL CHEMOTHERAPY TECHNIQUE: Informed consent was obtained from the patient prior to the procedure, including potential complications of headache, allergy, and pain. A 'time out' was performed. With the patient prone, the lower back was prepped with Betadine. 1% Lidocaine was used for local anesthesia. Lumbar puncture was performed at the L3-4 using a 28 gauge needle with return of clear CSF. methotrexate was injected into the subarachnoid  space, as prepared by pharmacy. The patient tolerated the procedure well without apparent complication. FLUOROSCOPY TIME:  5 minutes and 0 seconds IMPRESSION: Intrathecal injection of chemotherapy without complication Electronically Signed   By: Abigail Miyamoto M.D.   On: 05/23/2018 16:29    Discharge Laboratory Values: . CBC Latest Ref Rng & Units 06/16/2018 06/15/2018 06/14/2018  WBC 4.0 - 10.5 K/uL 4.5 5.4 6.8  Hemoglobin 13.0 - 17.0 g/dL 8.8(L) 9.0(L) 9.0(L)  Hematocrit 39.0 - 52.0 % 26.2(L) 28.1(L) 27.8(L)  Platelets 150 - 400 K/uL 389 454(H) 441(H)   . CMP Latest Ref Rng & Units 06/16/2018 06/15/2018 06/14/2018  Glucose 70 - 99 mg/dL 221(H) 251(H) 254(H)  BUN 8 - 23 mg/dL 20 21 20   Creatinine 0.61 - 1.24 mg/dL 0.75 0.71 0.82  Sodium 135 - 145 mmol/L 139 138 140  Potassium 3.5 - 5.1 mmol/L 3.7 4.0 4.1  Chloride 98 - 111 mmol/L 103 104 107  CO2 22 - 32 mmol/L 26 23 24   Calcium 8.9 - 10.3 mg/dL 9.6 9.2 9.5  Total Protein 6.5 - 8.1 g/dL - - -  Total Bilirubin 0.3 - 1.2 mg/dL - - -  Alkaline Phos 38 - 126 U/L - - -  AST 15 - 41 U/L - - -  ALT 0 - 44 U/L - - -     Brief H and P: For complete details please refer to admission H and P, but in brief,   Mark Frey  is a wonderful 62 y.o. male who was admitted for his C5 of his EPOCH-R treatment for his Burkitt's lymphoma and 4th cycle of IT MTX for CNS prophylaxis.  Issues during hospitalization  1. 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 No BM or CNS involvement.   04/18/18 PET/CT revealed Continued improvement in right colonic wall thickening and surrounding bulky masses consistent with treated lymphoma. No hypermetabolic activity to suggest residual tumor. There is some hypermetabolic activity associated with the sigmoid colon which demonstrates mild wall thickening, surrounding inflammation and underlying diverticulosis, suggesting mild diverticulitis. Suspected treatment related changes throughout the bone marrow. There is mildly increased activity within the clivus without clear corresponding finding on the CT images. Attention on follow-up recommended. Nonobstructing right renal calculi. 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   06/09/18 PET/CT revealedSoft tissue lesion within the ventral pelvis is again identified demonstrating mild to moderate increased uptake within SUV max of 5.61. Deauville criteria 4. Concerning for residual metabolically active tumor. 2. Similar appearance of diffusely thickened appendix within the right lower quadrant of the abdomen. Findings may reflect treatment related changes. 3. Increased radiotracer uptake throughout the bone marrow which is favored to represent treatment related changes. 4. Lad coronary artery atherosclerotic calcifications.   2. RLE DVT and b/l PE  3.s/pGI bleeding from lymphoma involving bowels-- resolved at this time -- will need to monitor with anticoagulation.  PLAN: - pt had no prohibitive toxicities from continuing C5D4 EPOCH-R at this time. -outpatient Rituxan and neulasta on 06/19/2018 -Pt received his fourth dose of IT MTX uneventfully 06/13/18  -Will discharge pt with Eliquis BID instead of lovenox since he shall not be receiving any planned lumbar puncture from this point on. -Continue with SCDs for DVT prophylaxis while hospitalization -Continue eating well, staying hydrated, and walking around as much as reasonably possible        4. DM2 with some increased hyperglycemia with steroids Plan -monitor  -continue Oral hypoglycemics and carb consistent diet at this time    Physical Exam at Discharge: BP 128/76 (BP Location: Right Arm)   Pulse 94   Temp 98.1 F (36.7 C) (Oral)   Resp 18   Ht 5' 9"  (1.753 m)   Wt 226 lb 6.6 oz (102.7 kg)   SpO2 100%   BMI 33.44 kg/m  . GENERAL:alert, in no acute distress and comfortable SKIN: no acute rashes, no significant lesions EYES: conjunctiva are pink and non-injected, sclera anicteric OROPHARYNX: MMM, no exudates, no oropharyngeal erythema or ulceration NECK: supple, no JVD LYMPH:  no palpable lymphadenopathy in the cervical, axillary or inguinal regions LUNGS: clear to auscultation b/l with normal respiratory effort HEART: regular rate & rhythm ABDOMEN:  normoactive bowel sounds , non tender, not distended. Extremity: no pedal edema PSYCH: alert & oriented x 3 with fluent speech NEURO: no focal motor/sensory deficits   Hospital Course:  Active Problems:   Deep vein thrombosis (DVT) of femoral vein of right lower extremity (HCC)   Burkitt lymphoma, lymph nodes of multiple sites [facility])   Diet:  Cardiac healthy consistent carbohydrate diet  Activity:  Regular. Infection prevention strategies  Condition at Discharge:   Stable  Signed: Dr. Sullivan Lone MD [place] [phone]  TT spent discharging patient >53mns  06/16/2018, 11:29 AM

## 2018-06-16 NOTE — Progress Notes (Signed)
Pt discharged home with spouse in stable condition. Discharge instructions given. Script sent to pharmacy of choice. No immediate questions or concerns at this time. Pt opted to ambulate off unit.

## 2018-06-19 ENCOUNTER — Inpatient Hospital Stay: Payer: BLUE CROSS/BLUE SHIELD | Attending: Hematology

## 2018-06-19 VITALS — BP 113/78 | HR 90 | Temp 98.2°F | Resp 16

## 2018-06-19 DIAGNOSIS — E1165 Type 2 diabetes mellitus with hyperglycemia: Secondary | ICD-10-CM | POA: Insufficient documentation

## 2018-06-19 DIAGNOSIS — K5730 Diverticulosis of large intestine without perforation or abscess without bleeding: Secondary | ICD-10-CM | POA: Diagnosis not present

## 2018-06-19 DIAGNOSIS — Z5111 Encounter for antineoplastic chemotherapy: Secondary | ICD-10-CM | POA: Insufficient documentation

## 2018-06-19 DIAGNOSIS — C8378 Burkitt lymphoma, lymph nodes of multiple sites: Secondary | ICD-10-CM | POA: Diagnosis not present

## 2018-06-19 DIAGNOSIS — Z7189 Other specified counseling: Secondary | ICD-10-CM

## 2018-06-19 DIAGNOSIS — I82411 Acute embolism and thrombosis of right femoral vein: Secondary | ICD-10-CM | POA: Diagnosis not present

## 2018-06-19 DIAGNOSIS — N200 Calculus of kidney: Secondary | ICD-10-CM | POA: Insufficient documentation

## 2018-06-19 DIAGNOSIS — Z95828 Presence of other vascular implants and grafts: Secondary | ICD-10-CM

## 2018-06-19 DIAGNOSIS — Z7901 Long term (current) use of anticoagulants: Secondary | ICD-10-CM | POA: Insufficient documentation

## 2018-06-19 DIAGNOSIS — E785 Hyperlipidemia, unspecified: Secondary | ICD-10-CM | POA: Insufficient documentation

## 2018-06-19 DIAGNOSIS — C8510 Unspecified B-cell lymphoma, unspecified site: Secondary | ICD-10-CM

## 2018-06-19 DIAGNOSIS — I2510 Atherosclerotic heart disease of native coronary artery without angina pectoris: Secondary | ICD-10-CM | POA: Insufficient documentation

## 2018-06-19 DIAGNOSIS — Z79899 Other long term (current) drug therapy: Secondary | ICD-10-CM | POA: Insufficient documentation

## 2018-06-19 DIAGNOSIS — I2699 Other pulmonary embolism without acute cor pulmonale: Secondary | ICD-10-CM | POA: Insufficient documentation

## 2018-06-19 MED ORDER — SODIUM CHLORIDE 0.9% FLUSH
10.0000 mL | INTRAVENOUS | Status: DC | PRN
  Administered 2018-06-19: 10 mL
  Filled 2018-06-19: qty 10

## 2018-06-19 MED ORDER — ACETAMINOPHEN 325 MG PO TABS
650.0000 mg | ORAL_TABLET | Freq: Once | ORAL | Status: AC
  Administered 2018-06-19: 650 mg via ORAL

## 2018-06-19 MED ORDER — ACETAMINOPHEN 325 MG PO TABS
ORAL_TABLET | ORAL | Status: AC
  Filled 2018-06-19: qty 2

## 2018-06-19 MED ORDER — METHYLPREDNISOLONE SODIUM SUCC 125 MG IJ SOLR
INTRAMUSCULAR | Status: AC
  Filled 2018-06-19: qty 2

## 2018-06-19 MED ORDER — PEGFILGRASTIM INJECTION 6 MG/0.6ML ~~LOC~~
PREFILLED_SYRINGE | SUBCUTANEOUS | Status: AC
  Filled 2018-06-19: qty 0.6

## 2018-06-19 MED ORDER — HEPARIN SOD (PORK) LOCK FLUSH 100 UNIT/ML IV SOLN
500.0000 [IU] | Freq: Once | INTRAVENOUS | Status: AC | PRN
  Administered 2018-06-19: 500 [IU]
  Filled 2018-06-19: qty 5

## 2018-06-19 MED ORDER — SODIUM CHLORIDE 0.9 % IV SOLN
375.0000 mg/m2 | Freq: Once | INTRAVENOUS | Status: AC
  Administered 2018-06-19: 900 mg via INTRAVENOUS
  Filled 2018-06-19: qty 40

## 2018-06-19 MED ORDER — SODIUM CHLORIDE 0.9 % IV SOLN
Freq: Once | INTRAVENOUS | Status: AC
  Administered 2018-06-19: 08:00:00 via INTRAVENOUS
  Filled 2018-06-19: qty 250

## 2018-06-19 MED ORDER — DIPHENHYDRAMINE HCL 25 MG PO CAPS
ORAL_CAPSULE | ORAL | Status: AC
  Filled 2018-06-19: qty 2

## 2018-06-19 MED ORDER — METHYLPREDNISOLONE SODIUM SUCC 125 MG IJ SOLR
80.0000 mg | Freq: Once | INTRAMUSCULAR | Status: AC
  Administered 2018-06-19: 80 mg via INTRAVENOUS

## 2018-06-19 MED ORDER — DIPHENHYDRAMINE HCL 25 MG PO CAPS
50.0000 mg | ORAL_CAPSULE | Freq: Once | ORAL | Status: AC
  Administered 2018-06-19: 50 mg via ORAL

## 2018-06-19 MED ORDER — PEGFILGRASTIM INJECTION 6 MG/0.6ML ~~LOC~~
6.0000 mg | PREFILLED_SYRINGE | Freq: Once | SUBCUTANEOUS | Status: AC
  Administered 2018-06-19: 6 mg via SUBCUTANEOUS

## 2018-06-19 NOTE — Patient Instructions (Signed)
[place] Discharge Instructions for Patients Receiving Chemotherapy  Today you received the following chemotherapy agents Rituxan  To help prevent nausea and vomiting after your treatment, we encourage you to take your nausea medication as directed If you develop nausea and vomiting that is not controlled by your nausea medication, call the clinic.   BELOW ARE SYMPTOMS THAT SHOULD BE REPORTED IMMEDIATELY:  *FEVER GREATER THAN 100.5 F  *CHILLS WITH OR WITHOUT FEVER  NAUSEA AND VOMITING THAT IS NOT CONTROLLED WITH YOUR NAUSEA MEDICATION  *UNUSUAL SHORTNESS OF BREATH  *UNUSUAL BRUISING OR BLEEDING  TENDERNESS IN MOUTH AND THROAT WITH OR WITHOUT PRESENCE OF ULCERS  *URINARY PROBLEMS  *BOWEL PROBLEMS  UNUSUAL RASH Items with * indicate a potential emergency and should be followed up as soon as possible.  Feel free to call the clinic should you have any questions or concerns. The clinic phone number is [phone].  Please show the [place] at check-in to the Emergency Department and triage nurse.

## 2018-06-20 NOTE — Discharge Summary (Signed)
[place]  Telephone:(336) [phone] Fax:(336) [phone]    Physician Discharge Summary     Patient ID: Mark Frey MRN: [id number] DOB/AGE: August 15, 1956 62 y.o.  Admit date: 06/12/2018 Discharge date: 06/20/2018  Primary Care Physician:  [name], MD   Discharge Diagnoses:    Present on Admission: . Burkitt lymphoma, lymph nodes of multiple sites [facility])   Discharge Medications:  Allergies as of 06/16/2018      Reactions   Ciprofloxacin Other (See Comments)   Leg tingling      Medication List    STOP taking these medications   enoxaparin 150 MG/ML injection Commonly known as:  LOVENOX     TAKE these medications   apixaban 5 MG Tabs tablet Commonly known as:  ELIQUIS Take 1 tablet (5 mg total) by mouth 2 (two) times daily.   blood glucose meter kit and supplies Dispense based on patient and insurance preference. Use daily as directed. (E11.9).   Choline Fenofibrate 135 MG capsule TAKE 1 CAPSULE DAILY What changed:  when to take this   furosemide 20 MG tablet Commonly known as:  LASIX TAKE 1 TABLET BY MOUTH DAILY HOLD AFTER 10-15 DAYS IF LEG SWELLING RESOLVED OR IF >10LBS WEIGHT LOSS   glucose blood test strip Use to check blood sugar daily   JANUMET 50-1000 MG tablet Generic drug:  sitaGLIPtin-metformin TAKE 1 TABLET TWICE DAILY  WITH MEALS   lidocaine-prilocaine cream Commonly known as:  EMLA Apply 1 application topically as needed. What changed:    when to take this  reasons to take this   loperamide 2 MG tablet Commonly known as:  IMODIUM A-D Take 2 mg by mouth 4 (four) times daily as needed for diarrhea or loose stools.   ondansetron 8 MG tablet Commonly known as:  ZOFRAN Take 1 tablet (8 mg total) by mouth every 8 (eight) hours as needed for nausea or vomiting.   senna-docusate 8.6-50 MG tablet Commonly known as:  Senokot-S Take 2 tablets by mouth at bedtime. What changed:    when to take  this  reasons to take this        Disposition and Follow-up:   Significant Diagnostic Studies:  Nm Pet Image Restag (ps) Skull Base To Thigh  Result Date: 06/09/2018 CLINICAL DATA:  Subsequent treatment strategy for lymphoma. EXAM: NUCLEAR MEDICINE PET SKULL BASE TO THIGH TECHNIQUE: 11.15 mCi F-18 FDG was injected intravenously. Full-ring PET imaging was performed from the skull base to thigh after the radiotracer. CT data was obtained and used for attenuation correction and anatomic localization. Fasting blood glucose: 173 mg/dl COMPARISON:  04/18/2018 FINDINGS: Mediastinal blood pool activity: SUV max 2.69 Liver activity: SUV max 3.47 NECK: No hypermetabolic lymph nodes in the neck. Incidental CT findings: none CHEST: No hypermetabolic mediastinal or hilar nodes. No suspicious pulmonary nodules on the CT scan. Incidental CT findings: Lad coronary artery calcification noted. ABDOMEN/PELVIS: No abnormal hypermetabolic activity within the liver, pancreas, adrenal glands, or spleen. No hypermetabolic lymph nodes in the abdomen or pelvis. Within the ventral aspect of the pelvis there is a soft tissue nodule with surrounding fat stranding measuring 2.7 by 2.1 cm within SUV max of 5.61, image 173/4. Deauville criteria 4. Previously this measured 2.8 x 2.6 cm within SUV max of 3.8. Incidental CT findings: The appendix is again noted and appears diffusely thickened measuring 1.3 cm in maximum thickness. Gas noted within the lumen of the appendix suggesting patency. SKELETON: No focal hypermetabolic activity to suggest  skeletal metastasis.There is diffusely increased radiotracer activity throughout the axial and appendicular skeleton which is favored to represent treatment related changes Incidental CT findings: None IMPRESSION: 1. Soft tissue lesion within the ventral pelvis is again identified demonstrating mild to moderate increased uptake within SUV max of 5.61. Deauville criteria 4. Concerning for residual  metabolically active tumor. 2. Similar appearance of diffusely thickened appendix within the right lower quadrant of the abdomen. Findings may reflect treatment related changes. 3. Increased radiotracer uptake throughout the bone marrow which is favored to represent treatment related changes. 4. Lad coronary artery atherosclerotic calcifications. Electronically Signed   By: Kerby Moors M.D.   On: 06/09/2018 13:06   Dg Fluoro Guided Loc Of Needle/cath Tip For Spinal Inject Rt  Result Date: 06/13/2018 CLINICAL DATA:  Lymphoma. EXAM: FLUOROSCOPICALLY GUIDED LUMBAR PUNCTURE FOR INTRATHECAL CHEMOTHERAPY FLUOROSCOPY TIME:  0 minutes and 45 seconds. PROCEDURE: Informed consent was obtained from the patient prior to the procedure, including potential complications of headache, allergy, and pain. With the patient prone, the lower back was prepped with Betadine. 1% Lidocaine was used for local anesthesia. Lumbar puncture was performed at the L3-4 level using a 20 gauge needle with return of clearCSF. 10 cc of methotrexate (12 mg) and hydrocortisone (50 mg) was injected into the subarachnoid space. The patient tolerated the procedure well without apparent complication. IMPRESSION: Fluoroscopic guided lumbar puncture and subsequent intrathecal injection of chemotherapy. Electronically Signed   By: Marijo Sanes M.D.   On: 06/13/2018 12:32   Dg Fluoro Guide Lumbar Puncture  Result Date: 05/23/2018 CLINICAL DATA:  Lymphoma. EXAM: FLUOROSCOPICALLY GUIDED LUMBAR PUNCTURE FOR INTRATHECAL CHEMOTHERAPY TECHNIQUE: Informed consent was obtained from the patient prior to the procedure, including potential complications of headache, allergy, and pain. A 'time out' was performed. With the patient prone, the lower back was prepped with Betadine. 1% Lidocaine was used for local anesthesia. Lumbar puncture was performed at the L3-4 using a 28 gauge needle with return of clear CSF. methotrexate was injected into the subarachnoid  space, as prepared by pharmacy. The patient tolerated the procedure well without apparent complication. FLUOROSCOPY TIME:  5 minutes and 0 seconds IMPRESSION: Intrathecal injection of chemotherapy without complication Electronically Signed   By: Abigail Miyamoto M.D.   On: 05/23/2018 16:29    Discharge Laboratory Values: . CBC Latest Ref Rng & Units 06/16/2018 06/15/2018 06/14/2018  WBC 4.0 - 10.5 K/uL 4.5 5.4 6.8  Hemoglobin 13.0 - 17.0 g/dL 8.8(L) 9.0(L) 9.0(L)  Hematocrit 39.0 - 52.0 % 26.2(L) 28.1(L) 27.8(L)  Platelets 150 - 400 K/uL 389 454(H) 441(H)   . CMP Latest Ref Rng & Units 06/16/2018 06/15/2018 06/14/2018  Glucose 70 - 99 mg/dL 221(H) 251(H) 254(H)  BUN 8 - 23 mg/dL 20 21 20   Creatinine 0.61 - 1.24 mg/dL 0.75 0.71 0.82  Sodium 135 - 145 mmol/L 139 138 140  Potassium 3.5 - 5.1 mmol/L 3.7 4.0 4.1  Chloride 98 - 111 mmol/L 103 104 107  CO2 22 - 32 mmol/L 26 23 24   Calcium 8.9 - 10.3 mg/dL 9.6 9.2 9.5  Total Protein 6.5 - 8.1 g/dL - - -  Total Bilirubin 0.3 - 1.2 mg/dL - - -  Alkaline Phos 38 - 126 U/L - - -  AST 15 - 41 U/L - - -  ALT 0 - 44 U/L - - -     Brief H and P: For complete details please refer to admission H and P, but in brief,   Mark Frey  is a wonderful 62 y.o. male who was admitted for his C5 of his EPOCH-R treatment for his Burkitt's lymphoma and 4th cycle of IT MTX for CNS prophylaxis.  Issues during hospitalization  1. 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 No BM or CNS involvement.   04/18/18 PET/CT revealed Continued improvement in right colonic wall thickening and surrounding bulky masses consistent with treated lymphoma. No hypermetabolic activity to suggest residual tumor. There is some hypermetabolic activity associated with the sigmoid colon which demonstrates mild wall thickening, surrounding inflammation and underlying diverticulosis, suggesting mild diverticulitis. Suspected treatment related changes throughout the bone marrow. There is mildly increased activity within the clivus without clear corresponding finding on the CT images. Attention on follow-up recommended. Nonobstructing right renal calculi. 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   06/09/18 PET/CT revealedSoft tissue lesion within the ventral pelvis is again identified demonstrating mild to moderate increased uptake within SUV max of 5.61. Deauville criteria 4. Concerning for residual metabolically active tumor. 2. Similar appearance of diffusely thickened appendix within the right lower quadrant of the abdomen. Findings may reflect treatment related changes. 3. Increased radiotracer uptake throughout the bone marrow which is favored to represent treatment related changes. 4. Lad coronary artery atherosclerotic calcifications.   2. RLE DVT and b/l PE  3.s/pGI bleeding from lymphoma involving bowels-- resolved at this time -- will need to monitor with anticoagulation.  PLAN: - pt had no prohibitive toxicities from continuing C5D4 EPOCH-R at this time. -outpatient Rituxan and neulasta on 06/19/2018 -Pt received his fourth dose of IT MTX uneventfully 06/13/18  -Will discharge pt with Eliquis BID instead of lovenox since he shall not be receiving any planned lumbar puncture from this point on. -Continue with SCDs for DVT prophylaxis while hospitalization -Continue eating well, staying hydrated, and walking around as much as reasonably possible        4. DM2 with some increased hyperglycemia with steroids Plan -monitor  -continue Oral hypoglycemics and carb consistent diet at this time    Physical Exam at Discharge: BP 128/76 (BP Location: Right Arm)   Pulse 94   Temp 98.1 F (36.7 C) (Oral)   Resp 18   Ht 5' 9"  (1.753 m)   Wt 226 lb 6.6 oz (102.7 kg)   SpO2 100%   BMI 33.44 kg/m  . GENERAL:alert, in no acute distress and comfortable SKIN: no acute rashes, no significant lesions EYES: conjunctiva are pink and non-injected, sclera anicteric OROPHARYNX: MMM, no exudates, no oropharyngeal erythema or ulceration NECK: supple, no JVD LYMPH:  no palpable lymphadenopathy in the cervical, axillary or inguinal regions LUNGS: clear to auscultation b/l with normal respiratory effort HEART: regular rate & rhythm ABDOMEN:  normoactive bowel sounds , non tender, not distended. Extremity: no pedal edema PSYCH: alert & oriented x 3 with fluent speech NEURO: no focal motor/sensory deficits   Hospital Course:  Active Problems:   Deep vein thrombosis (DVT) of femoral vein of right lower extremity (HCC)   Burkitt lymphoma, lymph nodes of multiple sites [facility])   Diet:  Cardiac healthy consistent carbohydrate diet  Activity:  Regular. Infection prevention strategies  Condition at Discharge:   Stable  Signed: Dr. Sullivan Lone MD [place] [phone]  TT spent discharging patient >49mns  06/20/2018, 10:29 PM

## 2018-06-26 NOTE — Progress Notes (Signed)
HEMATOLOGY/ONCOLOGY CLINIC NOTE  Date of Service: 06/27/18    Patient Care Team: [name], MD as PCP - General (Family Medicine)  CHIEF COMPLAINTS/PURPOSE OF CONSULTATION:  F/u for continued Mx of Burkitts lymphoma  HISTORY OF PRESENTING ILLNESS:   Mark Frey is a wonderful 62 y.o. male who has been referred to [place] by my colleague Dr. 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 He is accompanied today by his wife. The pt reports that he is doing well overall.   The pt started R-EPOCH every 3 weeks with neulasta support on 03/17/18. He presented to the ED with right leg swelling, found to be a DVT and bilateral PE, which resulted in the incidental finding of a lower abdomen tumor measuring 18.7 x 18.5 x 17.8 cm. He denies any abdominal pains or changes in his bowel habits prior to this finding. He first noted blood in the stools 4-5 days prior to appearing to the ED.  The pt reports that he has been taking 120m Lovenox injections once each day, except for the days in which he had blood in the stools. He hasn't had blood in his stools for the last 7 days.   He has had GI bleed related to bowel involvement with his lymphoma - this is now resolving and his hgb is stabilizing.  He notes that his scrotum and leg swelling has decreased. He denies having CP or SOB at any point from his PE.   Most recent lab results (04/06/18) of CBC w/diff, CMP, Reticulocytes  is as follows: all values are WNL except for WBC at 15.3k, RBC at 3.65, HGB at 10.5, HCT at 31.4, RDW at 17.8, PLT at 640k, ANC at 12.7k, Monocytes abs at 1.6k, Glucose at 162, Total Protein at 6.2, Albumin at 3.1, Total bilirubin at <0.2, Retic ct pct at 2.5%. Uric acid 04/06/18 was low at 3.0 LDH 04/06/18 elevated at 251  On review of systems, pt reports remaining right leg swelling, blood in the stools 7 days ago, two  bowel movements each day, left leg swelling, improved scrotal swelling, eating well, and denies problems with his port, abdominal pains, constipation, diarrhea, jaw pain, pain along the spine, problems passing urine, and any other symptoms.  Interval History:   Mark GLOSSERreturns today for management, evaluation, and treatment of his Burkitt Lymphoma prior to C6 of EPOCH-R. I last saw the pt at discharge from [facility] 06[facility]16/18. He is accompanied today by his wife. The pt reports that he is doing well overall.   The pt reports that his appetite has been weak for the last 7 days. He denies feeling nauseous or having lost his taste, but that he has simply lost his appetite. The pt notes that his appetite has marginally improved yesterday and today. He notes that he has lost his energy as well, and hasn't been walking or staying active. He has been able to continue moving his bowels well and notes that these have been normal.   The pt denies any new tingling or numbness in his hands or feet.   Lab results today (06/27/18) of CBC w/diff, CMP is as follows: all values are WNL except for RBC at 2.65, HGB at 8.1, HCT at 25.4, RDW at 16.8, Glucose at 203, Albumin at 3.0, ALT at 48. 06/27/18 Magnesium at 1.4  On review of systems, pt reports weak  appetite, lack of energy, normal bowel movements, and denies eating well, staying active, signs of infections, leg swelling, new neuropathy, mouth sores, abdominal pains, and any other symptoms.    MEDICAL HISTORY:  Past Medical History:  Diagnosis Date  . ALLERGIC RHINITIS   . Diabetes mellitus   . Hyperlipidemia     SURGICAL HISTORY: Past Surgical History:  Procedure Laterality Date  . BIOPSY  03/15/2018   Procedure: BIOPSY;  Surgeon: Milus Banister, MD;  Location: [facility];  Service: Endoscopy;;  . CLEFT PALATE REPAIR    . COLONOSCOPY N/A 03/15/2018   Procedure: COLONOSCOPY;  Surgeon: Milus Banister, MD;  Location: [facility];   Service: Endoscopy;  Laterality: N/A;  . deviated septum repair     slight improvement  . ESOPHAGOGASTRODUODENOSCOPY N/A 03/15/2018   Procedure: ESOPHAGOGASTRODUODENOSCOPY (EGD);  Surgeon: Milus Banister, MD;  Location: [name];  Service: Endoscopy;  Laterality: N/A;  . IR IMAGING GUIDED PORT INSERTION  03/17/2018  . TONSILLECTOMY      SOCIAL HISTORY: Social History   Socioeconomic History  . Marital status: Married    Spouse name: lisa  . Number of children: 0  . Years of education: Not on file  . Highest education level: Not on file  Occupational History  . Not on file  Social Needs  . Financial resource strain: Not hard at all  . Food insecurity:    Worry: Never true    Inability: Never true  . Transportation needs:    Medical: No    Non-medical: No  Tobacco Use  . Smoking status: Never Smoker  . Smokeless tobacco: Never Used  Substance and Sexual Activity  . Alcohol use: Yes    Comment: occasional  . Drug use: No  . Sexual activity: Yes  Lifestyle  . Physical activity:    Days per week: 0 days    Minutes per session: 0 min  . Stress: Not at all  Relationships  . Social connections:    Talks on phone: More than three times a week    Gets together: More than three times a week    Attends religious service: 1 to 4 times per year    Active member of club or organization: No    Attends meetings of clubs or organizations: Never    Relationship status: Married  . Intimate partner violence:    Fear of current or ex partner: No    Emotionally abused: No    Physically abused: No    Forced sexual activity: No  Other Topics Concern  . Not on file  Social History Narrative   Married 1985. No kids. 4 small dogs.       Works in Financial trader, residential      Hobbies: work on cars, Haematologist, exercise as able    FAMILY HISTORY: Family History  Problem Relation Age of Onset  . Lung cancer Mother        smoker  . Brain cancer Mother         metastasis  . AAA (abdominal aortic aneurysm) Father        smoker    ALLERGIES:  is allergic to ciprofloxacin.  MEDICATIONS:  Current Outpatient Medications  Medication Sig Dispense Refill  . apixaban (ELIQUIS) 5 MG TABS tablet Take 1 tablet (5 mg total) by mouth 2 (two) times daily. 60 tablet 3  . blood glucose meter kit and supplies Dispense based on patient and insurance preference. Use daily as directed. (E11.9). 1  each 0  . Choline Fenofibrate 135 MG capsule TAKE 1 CAPSULE DAILY (Patient taking differently: Take 135 mg by mouth every evening. ) 90 capsule 1  . furosemide (LASIX) 20 MG tablet TAKE 1 TABLET BY MOUTH DAILY HOLD AFTER 10-15 DAYS IF LEG SWELLING RESOLVED OR IF >10LBS WEIGHT LOSS (Patient not taking: TAKE 1 TABLET BY MOUTH DAILY HOLD AFTER 10-15 DAYS IF LEG SWELLING RESOLVED OR IF >10LBS WEIGHT LOSS) 30 tablet 0  . glucose blood (FREESTYLE TEST STRIPS) test strip Use to check blood sugar daily 100 each 4  . JANUMET 50-1000 MG tablet TAKE 1 TABLET TWICE DAILY  WITH MEALS (Patient taking differently: Take 1 tablet by mouth 2 (two) times daily with a meal. ) 180 tablet 3  . lidocaine-prilocaine (EMLA) cream Apply 1 application topically as needed. (Patient taking differently: Apply 1 application topically daily as needed (for treatment.). ) 30 g 2  . loperamide (IMODIUM A-D) 2 MG tablet Take 2 mg by mouth 4 (four) times daily as needed for diarrhea or loose stools.    . ondansetron (ZOFRAN) 8 MG tablet Take 1 tablet (8 mg total) by mouth every 8 (eight) hours as needed for nausea or vomiting. 20 tablet 0  . senna-docusate (SENOKOT-S) 8.6-50 MG tablet Take 2 tablets by mouth at bedtime. (Patient taking differently: Take 2 tablets by mouth at bedtime as needed for mild constipation. ) 60 tablet 1   No current facility-administered medications for this visit.     REVIEW OF SYSTEMS:    A 10+ POINT REVIEW OF SYSTEMS WAS OBTAINED including neurology, dermatology, psychiatry,  cardiac, respiratory, lymph, extremities, GI, GU, Musculoskeletal, constitutional, breasts, reproductive, HEENT.  All pertinent positives are noted in the HPI.  All others are negative.   PHYSICAL EXAMINATION: ECOG PERFORMANCE STATUS: 1 - Symptomatic but completely ambulatory  Vitals:   06/27/18 0855  BP: 118/75  Pulse: (!) 117  Resp: 16  Temp: 98.7 F (37.1 C)  SpO2: 100%   Filed Weights   06/27/18 0855  Weight: 217 lb 11.2 oz (98.7 kg)   .Body mass index is 32.15 kg/m.  GENERAL:alert, in no acute distress and comfortable SKIN: no acute rashes, no significant lesions EYES: conjunctiva are pink and non-injected, sclera anicteric OROPHARYNX: MMM, no exudates, no oropharyngeal erythema or ulceration NECK: supple, no JVD LYMPH:  no palpable lymphadenopathy in the cervical, axillary or inguinal regions LUNGS: clear to auscultation b/l with normal respiratory effort HEART: regular rate & rhythm ABDOMEN:  normoactive bowel sounds , non tender, not distended. No palpable hepatosplenomegaly.  Extremity: no pedal edema  PSYCH: alert & oriented x 3 with fluent speech NEURO: no focal motor/sensory deficits   LABORATORY DATA:  I have reviewed the data as listed  . CBC Latest Ref Rng & Units 06/27/2018 06/16/2018 06/15/2018  WBC 4.0 - 10.5 K/uL PENDING 4.5 5.4  Hemoglobin 13.0 - 17.0 g/dL 8.1(L) 8.8(L) 9.0(L)  Hematocrit 39.0 - 52.0 % 25.4(L) 26.2(L) 28.1(L)  Platelets 150 - 400 K/uL 243 389 454(H)    . CMP Latest Ref Rng & Units 06/27/2018 06/16/2018 06/15/2018  Glucose 70 - 99 mg/dL 203(H) 221(H) 251(H)  BUN 8 - 23 mg/dL _0 Creatinine 0.61 - 1.24 mg/dL 0.84 0.75 0.71  Sodium 135 - 145 mmol/L 135 139 138  Potassium 3.5 - 5.1 mmol/L 3.9 3.7 4.0  Chloride 98 - 111 mmol/L 99 103 104  CO2 22 - 32 mmol/L _1 Calcium 8.9 - 10.3 mg/dL 10.2 9.6  9.2  Total Protein 6.5 - 8.1 g/dL 6.8 - -  Total Bilirubin 0.3 - 1.2 mg/dL 0.3 - -  Alkaline Phos 38 - 126 U/L 60 - -  AST 15 -  41 U/L 26 - -  ALT 0 - 44 U/L 48(H) - -   03/17/18 Cytogenetics:   03/17/18 Colon Bx:   03/13/18 Peritoneum Biopsy:     RADIOGRAPHIC STUDIES:  I have personally reviewed the radiological images as listed and agreed with the findings in the report. Nm Pet Image Restag (ps) Skull Base To Thigh  Result Date: 06/09/2018 CLINICAL DATA:  Subsequent treatment strategy for lymphoma. EXAM: NUCLEAR MEDICINE PET SKULL BASE TO THIGH TECHNIQUE: 11.15 mCi F-18 FDG was injected intravenously. Full-ring PET imaging was performed from the skull base to thigh after the radiotracer. CT data was obtained and used for attenuation correction and anatomic localization. Fasting blood glucose: 173 mg/dl COMPARISON:  04/18/2018 FINDINGS: Mediastinal blood pool activity: SUV max 2.69 Liver activity: SUV max 3.47 NECK: No hypermetabolic lymph nodes in the neck. Incidental CT findings: none CHEST: No hypermetabolic mediastinal or hilar nodes. No suspicious pulmonary nodules on the CT scan. Incidental CT findings: Lad coronary artery calcification noted. ABDOMEN/PELVIS: No abnormal hypermetabolic activity within the liver, pancreas, adrenal glands, or spleen. No hypermetabolic lymph nodes in the abdomen or pelvis. Within the ventral aspect of the pelvis there is a soft tissue nodule with surrounding fat stranding measuring 2.7 by 2.1 cm within SUV max of 5.61, image 173/4. Deauville criteria 4. Previously this measured 2.8 x 2.6 cm within SUV max of 3.8. Incidental CT findings: The appendix is again noted and appears diffusely thickened measuring 1.3 cm in maximum thickness. Gas noted within the lumen of the appendix suggesting patency. SKELETON: No focal hypermetabolic activity to suggest skeletal metastasis.There is diffusely increased radiotracer activity throughout the axial and appendicular skeleton which is favored to represent treatment related changes Incidental CT findings: None IMPRESSION: 1. Soft tissue lesion within the  ventral pelvis is again identified demonstrating mild to moderate increased uptake within SUV max of 5.61. Deauville criteria 4. Concerning for residual metabolically active tumor. 2. Similar appearance of diffusely thickened appendix within the right lower quadrant of the abdomen. Findings may reflect treatment related changes. 3. Increased radiotracer uptake throughout the bone marrow which is favored to represent treatment related changes. 4. Lad coronary artery atherosclerotic calcifications. Electronically Signed   By: Kerby Moors M.D.   On: 06/09/2018 13:06   Dg Fluoro Guided Loc Of Needle/cath Tip For Spinal Inject Rt  Result Date: 06/13/2018 CLINICAL DATA:  Lymphoma. EXAM: FLUOROSCOPICALLY GUIDED LUMBAR PUNCTURE FOR INTRATHECAL CHEMOTHERAPY FLUOROSCOPY TIME:  0 minutes and 45 seconds. PROCEDURE: Informed consent was obtained from the patient prior to the procedure, including potential complications of headache, allergy, and pain. With the patient prone, the lower back was prepped with Betadine. 1% Lidocaine was used for local anesthesia. Lumbar puncture was performed at the L3-4 level using a 20 gauge needle with return of clearCSF. 10 cc of methotrexate (12 mg) and hydrocortisone (50 mg) was injected into the subarachnoid space. The patient tolerated the procedure well without apparent complication. IMPRESSION: Fluoroscopic guided lumbar puncture and subsequent intrathecal injection of chemotherapy. Electronically Signed   By: Marijo Sanes M.D.   On: 06/13/2018 12:32    ASSESSMENT & PLAN:   62 y.o. male with  1. 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 No BM or CNS involvement.   04/18/18 PET/CT revealed Continued improvement in right colonic wall thickening and surrounding bulky masses consistent with treated lymphoma. No hypermetabolic activity to suggest residual tumor. There is some hypermetabolic activity associated with the sigmoid colon which demonstrates mild wall thickening, surrounding inflammation and underlying diverticulosis, suggesting mild diverticulitis. Suspected treatment related changes throughout the bone marrow. There is mildly increased activity within the clivus without clear corresponding finding on the CT images. Attention on follow-up recommended. Nonobstructing right renal calculi. 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   06/09/18 PET/CT revealedSoft tissue lesion within the ventral pelvis is again identified demonstrating mild to moderate increased uptake within SUV max of 5.61. Deauville criteria 4. Concerning for residual metabolically active tumor. 2. Similar appearance of diffusely thickened appendix within the right lower quadrant of the abdomen. Findings may reflect treatment related changes. 3. Increased radiotracer uptake throughout the bone marrow which is favored to represent treatment related changes. 4. Lad coronary artery atherosclerotic calcifications.   2. RLE DVT and b/l PE  3.s/pGI bleeding from lymphoma involving bowels-- resolved at this time -- will need to monitor with anticoagulation.  PLAN: -Pt received his fourth and final planned dose of IT MTX uneventfully 06/13/18  -Discussed pt labwork today, 06/27/18; HGB at 8.1, Magnesium low at 1.4, blood counts and chemistries are otherwise stable.  -Based on the symptoms and HGB at 8.1, offered a blood transfusion which the pt would prefer -Offered Marinol for  appetite stimulation, which the pt declines at this time -Recommended that the pt continue to eat well, drink at least 48-64 oz of water each day, and walk 20-30 minutes each day.  -The pt has no prohibitive toxicities from his 5th cycle of EPOCH-R at this time.  -Discussed referring pt to [place] for discussion after completing C6 which the pt prefers -Continue PO 21m Eliquis BID -Will give pt referral to PT which he would prefer to begin after completing C6 -Fine to hold Lasix as leg swelling has resolved -The pt will begin C6 EPOCH-R on 07/03/18   -PET/CT after C6 of treatment.  4. DM2 with some increased hyperglycemia with steroids Plan -monitor  -continue Oral hypoglycemics and carb consistent diet at this time   PRBC transfusion 1 unit today/tommorrow Inpatient admission for C6 of EPOCH for 5 days from 07/03/2018 Outpatient Rituxan and Neulasta on 07/10/2018 PET/CT in 4 weeks RTC with Dr KIrene Limboin 5 weeks with labs   All of the patients questions were answered with apparent satisfaction. The patient knows to call the clinic with any problems, questions or concerns.  The total time spent in the appt was 30 minutes and more than 50% was on counseling and direct patient cares.     GSullivan LoneMD MS AAHIVMS SMedical Behavioral Hospital - MishawakaCSaint Clares Hospital - Sussex CampusHematology/Oncology Physician [facility]Min[facility] (Office):       [phone](Work cell):  [phone](Fax):           [phone] 06/27/2018 9:54 AM  I, SBaldwin Jamaica am acting as a scribe for Dr. KIrene Limbo .I have reviewed the above documentation for accuracy and completeness,  and I agree with the above. [name] MD

## 2018-06-27 ENCOUNTER — Other Ambulatory Visit: Payer: Self-pay | Admitting: Emergency Medicine

## 2018-06-27 ENCOUNTER — Inpatient Hospital Stay: Payer: BLUE CROSS/BLUE SHIELD

## 2018-06-27 ENCOUNTER — Other Ambulatory Visit: Payer: Self-pay

## 2018-06-27 ENCOUNTER — Telehealth: Payer: Self-pay

## 2018-06-27 ENCOUNTER — Inpatient Hospital Stay (HOSPITAL_BASED_OUTPATIENT_CLINIC_OR_DEPARTMENT_OTHER): Payer: BLUE CROSS/BLUE SHIELD | Admitting: Hematology

## 2018-06-27 VITALS — BP 118/75 | HR 117 | Temp 98.7°F | Resp 16 | Ht 69.0 in | Wt 217.7 lb

## 2018-06-27 VITALS — BP 129/79 | HR 108 | Temp 99.2°F | Resp 17 | Ht 69.0 in | Wt 215.9 lb

## 2018-06-27 DIAGNOSIS — Z95828 Presence of other vascular implants and grafts: Secondary | ICD-10-CM

## 2018-06-27 DIAGNOSIS — E1165 Type 2 diabetes mellitus with hyperglycemia: Secondary | ICD-10-CM | POA: Diagnosis not present

## 2018-06-27 DIAGNOSIS — C8378 Burkitt lymphoma, lymph nodes of multiple sites: Secondary | ICD-10-CM | POA: Diagnosis not present

## 2018-06-27 DIAGNOSIS — K5730 Diverticulosis of large intestine without perforation or abscess without bleeding: Secondary | ICD-10-CM | POA: Diagnosis not present

## 2018-06-27 DIAGNOSIS — Z79899 Other long term (current) drug therapy: Secondary | ICD-10-CM | POA: Diagnosis not present

## 2018-06-27 DIAGNOSIS — Z7901 Long term (current) use of anticoagulants: Secondary | ICD-10-CM | POA: Diagnosis not present

## 2018-06-27 DIAGNOSIS — D649 Anemia, unspecified: Secondary | ICD-10-CM

## 2018-06-27 DIAGNOSIS — C8510 Unspecified B-cell lymphoma, unspecified site: Secondary | ICD-10-CM

## 2018-06-27 DIAGNOSIS — I2699 Other pulmonary embolism without acute cor pulmonale: Secondary | ICD-10-CM | POA: Diagnosis not present

## 2018-06-27 DIAGNOSIS — Z7189 Other specified counseling: Secondary | ICD-10-CM

## 2018-06-27 DIAGNOSIS — E785 Hyperlipidemia, unspecified: Secondary | ICD-10-CM | POA: Diagnosis not present

## 2018-06-27 DIAGNOSIS — I2510 Atherosclerotic heart disease of native coronary artery without angina pectoris: Secondary | ICD-10-CM | POA: Diagnosis not present

## 2018-06-27 DIAGNOSIS — I82411 Acute embolism and thrombosis of right femoral vein: Secondary | ICD-10-CM | POA: Diagnosis not present

## 2018-06-27 DIAGNOSIS — Z5111 Encounter for antineoplastic chemotherapy: Secondary | ICD-10-CM | POA: Diagnosis not present

## 2018-06-27 DIAGNOSIS — N200 Calculus of kidney: Secondary | ICD-10-CM | POA: Diagnosis not present

## 2018-06-27 LAB — CMP (CANCER CENTER ONLY)
ALT: 48 U/L — AB (ref 0–44)
AST: 26 U/L (ref 15–41)
Albumin: 3 g/dL — ABNORMAL LOW (ref 3.5–5.0)
Alkaline Phosphatase: 60 U/L (ref 38–126)
Anion gap: 12 (ref 5–15)
BUN: 13 mg/dL (ref 8–23)
CO2: 24 mmol/L (ref 22–32)
Calcium: 10.2 mg/dL (ref 8.9–10.3)
Chloride: 99 mmol/L (ref 98–111)
Creatinine: 0.84 mg/dL (ref 0.61–1.24)
GFR, Est AFR Am: >60 mL/min (ref >60)
Glucose, Bld: 203 mg/dL — ABNORMAL HIGH (ref 70–99)
POTASSIUM: 3.9 mmol/L (ref 3.5–5.1)
SODIUM: 135 mmol/L (ref 135–145)
Total Bilirubin: 0.3 mg/dL (ref 0.3–1.2)
Total Protein: 6.8 g/dL (ref 6.5–8.1)

## 2018-06-27 LAB — CBC WITH DIFFERENTIAL/PLATELET
ABS IMMATURE GRANULOCYTES: 0.59 10*3/uL — AB (ref 0.00–0.07)
BASOS ABS: 0 10*3/uL (ref 0.0–0.1)
Basophils Relative: 0 %
Eosinophils Absolute: 0 10*3/uL (ref 0.0–0.5)
Eosinophils Relative: 0 %
HCT: 25.4 % — ABNORMAL LOW (ref 39.0–52.0)
HEMOGLOBIN: 8.1 g/dL — AB (ref 13.0–17.0)
Immature Granulocytes: 11 %
LYMPHS ABS: 0.4 10*3/uL — AB (ref 0.7–4.0)
Lymphocytes Relative: 8 %
MCH: 30.6 pg (ref 26.0–34.0)
MCHC: 31.9 g/dL (ref 30.0–36.0)
MCV: 95.8 fL (ref 80.0–100.0)
MONO ABS: 1.2 10*3/uL — AB (ref 0.1–1.0)
Monocytes Relative: 21 %
NEUTROS ABS: 3.3 10*3/uL (ref 1.7–7.7)
NRBC: 0 % (ref 0.0–0.2)
Neutrophils Relative %: 60 %
Platelets: 243 10*3/uL (ref 150–400)
RBC: 2.65 MIL/uL — AB (ref 4.22–5.81)
RDW: 16.8 % — ABNORMAL HIGH (ref 11.5–15.5)
WBC: 5.5 10*3/uL (ref 4.0–10.5)

## 2018-06-27 LAB — MAGNESIUM: MAGNESIUM: 1.4 mg/dL — AB (ref 1.7–2.4)

## 2018-06-27 LAB — PREPARE RBC (CROSSMATCH)

## 2018-06-27 MED ORDER — DIPHENHYDRAMINE HCL 25 MG PO CAPS: 25.0000 mg | ORAL_CAPSULE | Freq: Once | ORAL | Status: DC

## 2018-06-27 MED ORDER — MAGNESIUM 500 MG PO CAPS: 500.0000 mg | ORAL_CAPSULE | Freq: Two times a day (BID) | ORAL | 0 refills | Status: DC

## 2018-06-27 MED ORDER — HEPARIN SOD (PORK) LOCK FLUSH 100 UNIT/ML IV SOLN
500.0000 [IU] | Freq: Once | INTRAVENOUS | Status: AC
  Administered 2018-06-27: 500 [IU]
  Filled 2018-06-27: qty 5

## 2018-06-27 MED ORDER — SODIUM CHLORIDE 0.9% IV SOLUTION
250.0000 mL | Freq: Once | INTRAVENOUS | Status: DC
  Filled 2018-06-27: qty 250

## 2018-06-27 MED ORDER — SODIUM CHLORIDE 0.9% FLUSH
10.0000 mL | Freq: Once | INTRAVENOUS | Status: AC
  Administered 2018-06-27: 10 mL
  Filled 2018-06-27: qty 10

## 2018-06-27 MED ORDER — ACETAMINOPHEN 325 MG PO TABS: 650.0000 mg | ORAL_TABLET | Freq: Once | ORAL | Status: DC

## 2018-06-27 NOTE — Patient Instructions (Signed)
Implanted Port Home Guide An implanted port is a type of central line that is placed under the skin. Central lines are used to provide IV access when treatment or nutrition needs to be given through a person's veins. Implanted ports are used for long-term IV access. An implanted port may be placed because:  You need IV medicine that would be irritating to the small veins in your hands or arms.  You need long-term IV medicines, such as antibiotics.  You need IV nutrition for a long period.  You need frequent blood draws for lab tests.  You need dialysis.  Implanted ports are usually placed in the chest area, but they can also be placed in the upper arm, the abdomen, or the leg. An implanted port has two main parts:  Reservoir. The reservoir is round and will appear as a small, raised area under your skin. The reservoir is the part where a needle is inserted to give medicines or draw blood.  Catheter. The catheter is a thin, flexible tube that extends from the reservoir. The catheter is placed into a large vein. Medicine that is inserted into the reservoir goes into the catheter and then into the vein.  How will I care for my incision site? Do not get the incision site wet. Bathe or shower as directed by your health care provider. How is my port accessed? Special steps must be taken to access the port:  Before the port is accessed, a numbing cream can be placed on the skin. This helps numb the skin over the port site.  Your health care provider uses a sterile technique to access the port. ? Your health care provider must put on a mask and sterile gloves. ? The skin over your port is cleaned carefully with an antiseptic and allowed to dry. ? The port is gently pinched between sterile gloves, and a needle is inserted into the port.  Only "non-coring" port needles should be used to access the port. Once the port is accessed, a blood return should be checked. This helps ensure that the port  is in the vein and is not clogged.  If your port needs to remain accessed for a constant infusion, a clear (transparent) bandage will be placed over the needle site. The bandage and needle will need to be changed every week, or as directed by your health care provider.  Keep the bandage covering the needle clean and dry. Do not get it wet. Follow your health care provider's instructions on how to take a shower or bath while the port is accessed.  If your port does not need to stay accessed, no bandage is needed over the port.  What is flushing? Flushing helps keep the port from getting clogged. Follow your health care provider's instructions on how and when to flush the port. Ports are usually flushed with saline solution or a medicine called heparin. The need for flushing will depend on how the port is used.  If the port is used for intermittent medicines or blood draws, the port will need to be flushed: ? After medicines have been given. ? After blood has been drawn. ? As part of routine maintenance.  If a constant infusion is running, the port may not need to be flushed.  How long will my port stay implanted? The port can stay in for as long as your health care provider thinks it is needed. When it is time for the port to come out, surgery will be   done to remove it. The procedure is similar to the one performed when the port was put in. When should I seek immediate medical care? When you have an implanted port, you should seek immediate medical care if:  You notice a bad smell coming from the incision site.  You have swelling, redness, or drainage at the incision site.  You have more swelling or pain at the port site or the surrounding area.  You have a fever that is not controlled with medicine.  This information is not intended to replace advice given to you by your health care provider. Make sure you discuss any questions you have with your health care provider. Document  Released: 08/30/2005 Document Revised: 02/05/2016 Document Reviewed: 05/07/2013 Elsevier Interactive Patient Education  2017 [employer]  

## 2018-06-27 NOTE — Telephone Encounter (Signed)
I called patient to let him know that he has a 1300 appointment with Lab and a 1345 appointment to have 1 unit of blood transfused today.  Patient verbalized understanding and stated he would be here.

## 2018-06-28 ENCOUNTER — Inpatient Hospital Stay: Payer: BLUE CROSS/BLUE SHIELD

## 2018-06-28 DIAGNOSIS — Z79899 Other long term (current) drug therapy: Secondary | ICD-10-CM | POA: Diagnosis not present

## 2018-06-28 DIAGNOSIS — I2699 Other pulmonary embolism without acute cor pulmonale: Secondary | ICD-10-CM | POA: Diagnosis not present

## 2018-06-28 DIAGNOSIS — C8378 Burkitt lymphoma, lymph nodes of multiple sites: Secondary | ICD-10-CM | POA: Diagnosis not present

## 2018-06-28 DIAGNOSIS — N200 Calculus of kidney: Secondary | ICD-10-CM | POA: Diagnosis not present

## 2018-06-28 DIAGNOSIS — K5730 Diverticulosis of large intestine without perforation or abscess without bleeding: Secondary | ICD-10-CM | POA: Diagnosis not present

## 2018-06-28 DIAGNOSIS — Z5111 Encounter for antineoplastic chemotherapy: Secondary | ICD-10-CM | POA: Diagnosis not present

## 2018-06-28 DIAGNOSIS — E785 Hyperlipidemia, unspecified: Secondary | ICD-10-CM | POA: Diagnosis not present

## 2018-06-28 DIAGNOSIS — D649 Anemia, unspecified: Secondary | ICD-10-CM

## 2018-06-28 DIAGNOSIS — Z7901 Long term (current) use of anticoagulants: Secondary | ICD-10-CM | POA: Diagnosis not present

## 2018-06-28 DIAGNOSIS — E1165 Type 2 diabetes mellitus with hyperglycemia: Secondary | ICD-10-CM | POA: Diagnosis not present

## 2018-06-28 DIAGNOSIS — I2510 Atherosclerotic heart disease of native coronary artery without angina pectoris: Secondary | ICD-10-CM | POA: Diagnosis not present

## 2018-06-28 DIAGNOSIS — I82411 Acute embolism and thrombosis of right femoral vein: Secondary | ICD-10-CM | POA: Diagnosis not present

## 2018-06-28 MED ORDER — DIPHENHYDRAMINE HCL 25 MG PO CAPS
ORAL_CAPSULE | ORAL | Status: AC
  Filled 2018-06-28: qty 1

## 2018-06-28 MED ORDER — DIPHENHYDRAMINE HCL 25 MG PO CAPS
25.0000 mg | ORAL_CAPSULE | Freq: Once | ORAL | Status: AC
  Administered 2018-06-28: 25 mg via ORAL

## 2018-06-28 MED ORDER — SODIUM CHLORIDE 0.9% FLUSH
10.0000 mL | INTRAVENOUS | Status: AC | PRN
  Administered 2018-06-28: 10 mL
  Filled 2018-06-28: qty 10

## 2018-06-28 MED ORDER — SODIUM CHLORIDE 0.9% IV SOLUTION
250.0000 mL | Freq: Once | INTRAVENOUS | Status: AC
  Administered 2018-06-28: 250 mL via INTRAVENOUS
  Filled 2018-06-28: qty 250

## 2018-06-28 MED ORDER — ACETAMINOPHEN 325 MG PO TABS
ORAL_TABLET | ORAL | Status: AC
  Filled 2018-06-28: qty 2

## 2018-06-28 MED ORDER — ACETAMINOPHEN 325 MG PO TABS
650.0000 mg | ORAL_TABLET | Freq: Once | ORAL | Status: AC
  Administered 2018-06-28: 650 mg via ORAL

## 2018-06-28 MED ORDER — HEPARIN SOD (PORK) LOCK FLUSH 100 UNIT/ML IV SOLN
500.0000 [IU] | Freq: Every day | INTRAVENOUS | Status: AC | PRN
  Administered 2018-06-28: 500 [IU]
  Filled 2018-06-28: qty 5

## 2018-06-28 NOTE — Patient Instructions (Signed)

## 2018-06-29 LAB — TYPE AND SCREEN
ABO/RH(D): O POS
Antibody Screen: NEGATIVE
Unit division: 0

## 2018-06-29 LAB — BPAM RBC
BLOOD PRODUCT EXPIRATION DATE: 201911112359
ISSUE DATE / TIME: 201910161349
Unit Type and Rh: 5100

## 2018-07-03 ENCOUNTER — Other Ambulatory Visit: Payer: Self-pay

## 2018-07-03 ENCOUNTER — Inpatient Hospital Stay (HOSPITAL_COMMUNITY)
Admission: AD | Admit: 2018-07-03 | Discharge: 2018-07-07 | DRG: 847 | Disposition: A | Discharge status: Home / Self Care | Payer: BLUE CROSS/BLUE SHIELD | Source: Ambulatory Visit | Attending: Hematology | Admitting: Hematology

## 2018-07-03 ENCOUNTER — Encounter (HOSPITAL_COMMUNITY): Payer: Self-pay

## 2018-07-03 DIAGNOSIS — Z801 Family history of malignant neoplasm of trachea, bronchus and lung: Secondary | ICD-10-CM

## 2018-07-03 DIAGNOSIS — C8370 Burkitt lymphoma, unspecified site: Secondary | ICD-10-CM | POA: Diagnosis present

## 2018-07-03 DIAGNOSIS — J309 Allergic rhinitis, unspecified: Secondary | ICD-10-CM | POA: Diagnosis not present

## 2018-07-03 DIAGNOSIS — E1165 Type 2 diabetes mellitus with hyperglycemia: Secondary | ICD-10-CM | POA: Diagnosis not present

## 2018-07-03 DIAGNOSIS — E119 Type 2 diabetes mellitus without complications: Secondary | ICD-10-CM | POA: Diagnosis not present

## 2018-07-03 DIAGNOSIS — E785 Hyperlipidemia, unspecified: Secondary | ICD-10-CM | POA: Diagnosis present

## 2018-07-03 DIAGNOSIS — T380X5A Adverse effect of glucocorticoids and synthetic analogues, initial encounter: Secondary | ICD-10-CM | POA: Diagnosis present

## 2018-07-03 DIAGNOSIS — Z881 Allergy status to other antibiotic agents status: Secondary | ICD-10-CM

## 2018-07-03 DIAGNOSIS — Z5111 Encounter for antineoplastic chemotherapy: Principal | ICD-10-CM

## 2018-07-03 DIAGNOSIS — I2699 Other pulmonary embolism without acute cor pulmonale: Secondary | ICD-10-CM | POA: Diagnosis not present

## 2018-07-03 DIAGNOSIS — C8378 Burkitt lymphoma, lymph nodes of multiple sites: Secondary | ICD-10-CM | POA: Diagnosis not present

## 2018-07-03 DIAGNOSIS — I82411 Acute embolism and thrombosis of right femoral vein: Secondary | ICD-10-CM

## 2018-07-03 DIAGNOSIS — R5383 Other fatigue: Secondary | ICD-10-CM | POA: Diagnosis present

## 2018-07-03 DIAGNOSIS — C8510 Unspecified B-cell lymphoma, unspecified site: Secondary | ICD-10-CM

## 2018-07-03 DIAGNOSIS — T451X5A Adverse effect of antineoplastic and immunosuppressive drugs, initial encounter: Secondary | ICD-10-CM | POA: Diagnosis not present

## 2018-07-03 DIAGNOSIS — Z7901 Long term (current) use of anticoagulants: Secondary | ICD-10-CM | POA: Diagnosis not present

## 2018-07-03 DIAGNOSIS — Z87730 Personal history of (corrected) cleft lip and palate: Secondary | ICD-10-CM

## 2018-07-03 DIAGNOSIS — Z86711 Personal history of pulmonary embolism: Secondary | ICD-10-CM

## 2018-07-03 DIAGNOSIS — I82401 Acute embolism and thrombosis of unspecified deep veins of right lower extremity: Secondary | ICD-10-CM | POA: Diagnosis not present

## 2018-07-03 DIAGNOSIS — Z808 Family history of malignant neoplasm of other organs or systems: Secondary | ICD-10-CM | POA: Diagnosis not present

## 2018-07-03 DIAGNOSIS — Z86718 Personal history of other venous thrombosis and embolism: Secondary | ICD-10-CM | POA: Diagnosis not present

## 2018-07-03 DIAGNOSIS — K922 Gastrointestinal hemorrhage, unspecified: Secondary | ICD-10-CM | POA: Diagnosis not present

## 2018-07-03 DIAGNOSIS — Z7189 Other specified counseling: Secondary | ICD-10-CM

## 2018-07-03 LAB — COMPREHENSIVE METABOLIC PANEL
ALT: 17 U/L (ref 0–44)
ANION GAP: 7 (ref 5–15)
AST: 18 U/L (ref 15–41)
Albumin: 3.3 g/dL — ABNORMAL LOW (ref 3.5–5.0)
Alkaline Phosphatase: 46 U/L (ref 38–126)
BILIRUBIN TOTAL: 0.4 mg/dL (ref 0.3–1.2)
BUN: 9 mg/dL (ref 8–23)
CO2: 25 mmol/L (ref 22–32)
Calcium: 9.4 mg/dL (ref 8.9–10.3)
Chloride: 106 mmol/L (ref 98–111)
Creatinine, Ser: 0.68 mg/dL (ref 0.61–1.24)
Glucose, Bld: 136 mg/dL — ABNORMAL HIGH (ref 70–99)
Potassium: 3.8 mmol/L (ref 3.5–5.1)
SODIUM: 138 mmol/L (ref 135–145)
TOTAL PROTEIN: 6.3 g/dL — AB (ref 6.5–8.1)

## 2018-07-03 LAB — CBC WITH DIFFERENTIAL/PLATELET
Abs Immature Granulocytes: 0.06 10*3/uL (ref 0.00–0.07)
BASOS PCT: 0 %
Basophils Absolute: 0 10*3/uL (ref 0.0–0.1)
EOS PCT: 0 %
Eosinophils Absolute: 0 10*3/uL (ref 0.0–0.5)
HCT: 29.5 % — ABNORMAL LOW (ref 39.0–52.0)
Hemoglobin: 9 g/dL — ABNORMAL LOW (ref 13.0–17.0)
Immature Granulocytes: 1 %
Lymphocytes Relative: 16 %
Lymphs Abs: 1 10*3/uL (ref 0.7–4.0)
MCH: 30.9 pg (ref 26.0–34.0)
MCHC: 30.5 g/dL (ref 30.0–36.0)
MCV: 101.4 fL — AB (ref 80.0–100.0)
MONOS PCT: 19 %
Monocytes Absolute: 1.2 10*3/uL — ABNORMAL HIGH (ref 0.1–1.0)
Neutro Abs: 4.2 10*3/uL (ref 1.7–7.7)
Neutrophils Relative %: 64 %
Platelets: 436 10*3/uL — ABNORMAL HIGH (ref 150–400)
RBC: 2.91 MIL/uL — AB (ref 4.22–5.81)
RDW: 19 % — ABNORMAL HIGH (ref 11.5–15.5)
WBC: 6.5 10*3/uL (ref 4.0–10.5)
nRBC: 0 % (ref 0.0–0.2)

## 2018-07-03 LAB — MAGNESIUM: MAGNESIUM: 1.7 mg/dL (ref 1.7–2.4)

## 2018-07-03 LAB — PHOSPHORUS: PHOSPHORUS: 3.8 mg/dL (ref 2.5–4.6)

## 2018-07-03 MED ORDER — SITAGLIPTIN PHOS-METFORMIN HCL 50-1000 MG PO TABS: 1.0000 | ORAL_TABLET | Freq: Two times a day (BID) | ORAL | Status: DC

## 2018-07-03 MED ORDER — LINAGLIPTIN 5 MG PO TABS
5.0000 mg | ORAL_TABLET | Freq: Every day | ORAL | Status: DC
  Administered 2018-07-04 – 2018-07-07 (×4): 5 mg via ORAL
  Filled 2018-07-03 (×4): qty 1

## 2018-07-03 MED ORDER — PREDNISONE 5 MG PO TABS
30.0000 mg | ORAL_TABLET | Freq: Two times a day (BID) | ORAL | Status: DC
  Administered 2018-07-03 – 2018-07-07 (×8): 30 mg via ORAL
  Filled 2018-07-03 (×8): qty 1

## 2018-07-03 MED ORDER — FUROSEMIDE 20 MG PO TABS
20.0000 mg | ORAL_TABLET | Freq: Every day | ORAL | Status: DC | PRN
  Administered 2018-07-04 – 2018-07-07 (×4): 20 mg via ORAL
  Filled 2018-07-03 (×4): qty 1

## 2018-07-03 MED ORDER — APIXABAN 5 MG PO TABS
5.0000 mg | ORAL_TABLET | Freq: Two times a day (BID) | ORAL | Status: DC
  Administered 2018-07-03 – 2018-07-07 (×8): 5 mg via ORAL
  Filled 2018-07-03 (×8): qty 1

## 2018-07-03 MED ORDER — VINCRISTINE SULFATE CHEMO INJECTION 1 MG/ML
Freq: Once | INTRAVENOUS | Status: AC
  Administered 2018-07-03: 16:00:00 via INTRAVENOUS
  Filled 2018-07-03: qty 12

## 2018-07-03 MED ORDER — SODIUM CHLORIDE 0.9 % IV SOLN
Freq: Once | INTRAVENOUS | Status: AC
  Administered 2018-07-03: 8 mg via INTRAVENOUS
  Filled 2018-07-03: qty 4

## 2018-07-03 MED ORDER — METFORMIN HCL 500 MG PO TABS
500.0000 mg | ORAL_TABLET | Freq: Two times a day (BID) | ORAL | Status: DC
  Administered 2018-07-03 – 2018-07-07 (×9): 500 mg via ORAL
  Filled 2018-07-03 (×9): qty 1

## 2018-07-03 MED ORDER — LIDOCAINE-PRILOCAINE 2.5-2.5 % EX CREA: 1.0000 "application " | TOPICAL_CREAM | CUTANEOUS | Status: DC | PRN

## 2018-07-03 MED ORDER — SENNA 8.6 MG PO TABS
2.0000 | ORAL_TABLET | Freq: Two times a day (BID) | ORAL | Status: DC | PRN
  Administered 2018-07-03: 17.2 mg via ORAL
  Filled 2018-07-03: qty 2

## 2018-07-03 MED ORDER — SODIUM CHLORIDE 0.9 % IV SOLN
INTRAVENOUS | Status: DC
  Administered 2018-07-03: 16:00:00 via INTRAVENOUS

## 2018-07-03 NOTE — Progress Notes (Signed)
Chemotherapy dosage verified with Drue Dun, RN.

## 2018-07-03 NOTE — Progress Notes (Signed)
HEMATOLOGY/ONCOLOGY CONSULTATION NOTE  Date of Service: 07/03/2018  Patient Care Team: [name], MD as PCP - General (Family Medicine)  CHIEF COMPLAINTS/PURPOSE OF CONSULTATION:  C6 EPOCH-R treatment for Burkitt's Lymphoma  HISTORY OF PRESENTING ILLNESS:   Mark Frey is a wonderful 62 y.o. male who has been admitted today for C6 EPOCH-R treatment of his Burkitt's lymphoma. The pt reports that he is doing well overall.  He has tolerated treatment well thus far with no prohibitive toxicities though notes some cumulative fatigue. The pt reports that he is feeling a little better after his recent blood transfusion. He has had some mild constipation which he feels is is IBS, and notes that when he has moved his bowels they have been liquid, and denies mucous or blood in the stools. He notes that there is some discomfort associated with his bowel movements.No blood mucous in stools.  The pt notes that he has had intermittent mild nausea as well and has been eating well.   Lab results today (07/03/18) of CBC w/diff and CMP is as follows: all values are WNL except for RBC at 2.91, HGB at 9.0, HCT at 29.5, MCV at 101.4, RDW at 19.0, PLT at 436k, Monocytes abs at 1.2k, Glucose at 136, Total Protein at 6.3, Albumin at 3.3.  On review of systems, pt reports liquid stools, constipation, improved energy levels, eating well, intermittent nausea, some abdominal cramps, and denies mucous in the stools, blood in the stools, fevers, chills, nausea, concerns for bleeding, back pains, and any other symptoms.   MEDICAL HISTORY:  Past Medical History:  Diagnosis Date  . ALLERGIC RHINITIS   . Diabetes mellitus   . Hyperlipidemia     SURGICAL HISTORY: Past Surgical History:  Procedure Laterality Date  . BIOPSY  03/15/2018   Procedure: BIOPSY;  Surgeon: Milus Banister, MD;  Location: [facility];  Service: Endoscopy;;  . CLEFT PALATE REPAIR    . COLONOSCOPY N/A 03/15/2018   Procedure:  COLONOSCOPY;  Surgeon: Milus Banister, MD;  Location: [facility];  Service: Endoscopy;  Laterality: N/A;  . deviated septum repair     slight improvement  . ESOPHAGOGASTRODUODENOSCOPY N/A 03/15/2018   Procedure: ESOPHAGOGASTRODUODENOSCOPY (EGD);  Surgeon: Milus Banister, MD;  Location: [name];  Service: Endoscopy;  Laterality: N/A;  . IR IMAGING GUIDED PORT INSERTION  03/17/2018  . TONSILLECTOMY      SOCIAL HISTORY: Social History   Socioeconomic History  . Marital status: Married    Spouse name: lisa  . Number of children: 0  . Years of education: Not on file  . Highest education level: Not on file  Occupational History  . Not on file  Social Needs  . Financial resource strain: Not hard at all  . Food insecurity:    Worry: Never true    Inability: Never true  . Transportation needs:    Medical: No    Non-medical: No  Tobacco Use  . Smoking status: Never Smoker  . Smokeless tobacco: Never Used  Substance and Sexual Activity  . Alcohol use: Yes    Comment: occasional  . Drug use: No  . Sexual activity: Yes  Lifestyle  . Physical activity:    Days per week: 0 days    Minutes per session: 0 min  . Stress: Not at all  Relationships  . Social connections:    Talks on phone: More than three times a week    Gets together: More than three times a  week    Attends religious service: 1 to 4 times per year    Active member of club or organization: No    Attends meetings of clubs or organizations: Never    Relationship status: Married  . Intimate partner violence:    Fear of current or ex partner: No    Emotionally abused: No    Physically abused: No    Forced sexual activity: No  Other Topics Concern  . Not on file  Social History Narrative   Married 1985. No kids. 4 small dogs.       Works in Financial trader, residential      Hobbies: work on cars, Haematologist, exercise as able    FAMILY HISTORY: Family History  Problem Relation Age of Onset  . Lung  cancer Mother        smoker  . Brain cancer Mother        metastasis  . AAA (abdominal aortic aneurysm) Father        smoker    ALLERGIES:  is allergic to ciprofloxacin.  MEDICATIONS:  Current Facility-Administered Medications  Medication Dose Route Frequency Provider Last Rate Last Dose  . 0.9 %  sodium chloride infusion   Intravenous Continuous [name], MD 10 mL/hr at 07/03/18 1531    . apixaban (ELIQUIS) tablet 5 mg  5 mg Oral BID [name], MD      . DOXOrubicin (ADRIAMYCIN) 24 mg, etoposide (VEPESID) 120 mg, vinCRIStine (ONCOVIN) 1 mg in sodium chloride 0.9 % 1,000 mL chemo infusion   Intravenous Once [name], MD 51 mL/hr at 07/03/18 1613    . furosemide (LASIX) tablet 20 mg  20 mg Oral Daily PRN [name], MD      . lidocaine-prilocaine (EMLA) cream 1 application  1 application Topical PRN [name], MD      . Derrill Memo ON 07/04/2018] linagliptin (TRADJENTA) tablet 5 mg  5 mg Oral QAC breakfast [name], MD       And  . metFORMIN (GLUCOPHAGE) tablet 500 mg  500 mg Oral BID WC [name], MD   500 mg at 07/03/18 1116  . predniSONE (DELTASONE) tablet 30 mg  30 mg Oral BID WC [name], MD        REVIEW OF SYSTEMS:    10 Point review of Systems was done is negative except as noted above.  PHYSICAL EXAMINATION: ECOG PERFORMANCE STATUS: 1 - Symptomatic but completely ambulatory  . Vitals:   07/03/18 1020 07/03/18 1439  BP: 127/77 116/73  Pulse: 67 (!) 103  Resp: 16 16  Temp: 98.5 F (36.9 C) 98.5 F (36.9 C)  SpO2: 96% 97%   Filed Weights   07/03/18 1020  Weight: 222 lb 8 oz (100.9 kg)   .Body mass index is 32.86 kg/m.  GENERAL:alert, in no acute distress and comfortable SKIN: no acute rashes, no significant lesions EYES: conjunctiva are pink and non-injected, sclera anicteric OROPHARYNX: MMM, no exudates, no oropharyngeal erythema or ulceration NECK: supple, no JVD LYMPH:   no palpable lymphadenopathy in the cervical, axillary or inguinal regions LUNGS: clear to auscultation b/l with normal respiratory effort HEART: regular rate & rhythm ABDOMEN:  normoactive bowel sounds , non tender, not distended. Extremity: 1+ right pedal edema, no left pedal edema  PSYCH: alert & oriented x 3 with fluent speech NEURO: no focal motor/sensory deficits  LABORATORY DATA:  I have reviewed the data as listed  . CBC Latest Ref  Rng & Units 07/03/2018 06/27/2018 06/16/2018  WBC 4.0 - 10.5 K/uL 6.5 5.5 4.5  Hemoglobin 13.0 - 17.0 g/dL 9.0(L) 8.1(L) 8.8(L)  Hematocrit 39.0 - 52.0 % 29.5(L) 25.4(L) 26.2(L)  Platelets 150 - 400 K/uL 436(H) 243 389    . CMP Latest Ref Rng & Units 07/03/2018 06/27/2018 06/16/2018  Glucose 70 - 99 mg/dL 136(H) 203(H) 221(H)  BUN 8 - 23 mg/dL 9 13 20   Creatinine 0.61 - 1.24 mg/dL 0.68 0.84 0.75  Sodium 135 - 145 mmol/L 138 135 139  Potassium 3.5 - 5.1 mmol/L 3.8 3.9 3.7  Chloride 98 - 111 mmol/L 106 99 103  CO2 22 - 32 mmol/L 25 24 26   Calcium 8.9 - 10.3 mg/dL 9.4 10.2 9.6  Total Protein 6.5 - 8.1 g/dL 6.3(L) 6.8 -  Total Bilirubin 0.3 - 1.2 mg/dL 0.4 0.3 -  Alkaline Phos 38 - 126 U/L 46 60 -  AST 15 - 41 U/L 18 26 -  ALT 0 - 44 U/L 17 48(H) -     RADIOGRAPHIC STUDIES: I have personally reviewed the radiological images as listed and agreed with the findings in the report. Nm Pet Image Restag (ps) Skull Base To Thigh  Result Date: 06/09/2018 CLINICAL DATA:  Subsequent treatment strategy for lymphoma. EXAM: NUCLEAR MEDICINE PET SKULL BASE TO THIGH TECHNIQUE: 11.15 mCi F-18 FDG was injected intravenously. Full-ring PET imaging was performed from the skull base to thigh after the radiotracer. CT data was obtained and used for attenuation correction and anatomic localization. Fasting blood glucose: 173 mg/dl COMPARISON:  04/18/2018 FINDINGS: Mediastinal blood pool activity: SUV max 2.69 Liver activity: SUV max 3.47 NECK: No hypermetabolic lymph  nodes in the neck. Incidental CT findings: none CHEST: No hypermetabolic mediastinal or hilar nodes. No suspicious pulmonary nodules on the CT scan. Incidental CT findings: Lad coronary artery calcification noted. ABDOMEN/PELVIS: No abnormal hypermetabolic activity within the liver, pancreas, adrenal glands, or spleen. No hypermetabolic lymph nodes in the abdomen or pelvis. Within the ventral aspect of the pelvis there is a soft tissue nodule with surrounding fat stranding measuring 2.7 by 2.1 cm within SUV max of 5.61, image 173/4. Deauville criteria 4. Previously this measured 2.8 x 2.6 cm within SUV max of 3.8. Incidental CT findings: The appendix is again noted and appears diffusely thickened measuring 1.3 cm in maximum thickness. Gas noted within the lumen of the appendix suggesting patency. SKELETON: No focal hypermetabolic activity to suggest skeletal metastasis.There is diffusely increased radiotracer activity throughout the axial and appendicular skeleton which is favored to represent treatment related changes Incidental CT findings: None IMPRESSION: 1. Soft tissue lesion within the ventral pelvis is again identified demonstrating mild to moderate increased uptake within SUV max of 5.61. Deauville criteria 4. Concerning for residual metabolically active tumor. 2. Similar appearance of diffusely thickened appendix within the right lower quadrant of the abdomen. Findings may reflect treatment related changes. 3. Increased radiotracer uptake throughout the bone marrow which is favored to represent treatment related changes. 4. Lad coronary artery atherosclerotic calcifications. Electronically Signed   By: Kerby Moors M.D.   On: 06/09/2018 13:06   Dg Fluoro Guided Loc Of Needle/cath Tip For Spinal Inject Rt  Result Date: 06/13/2018 CLINICAL DATA:  Lymphoma. EXAM: FLUOROSCOPICALLY GUIDED LUMBAR PUNCTURE FOR INTRATHECAL CHEMOTHERAPY FLUOROSCOPY TIME:  0 minutes and 45 seconds. PROCEDURE: Informed consent  was obtained from the patient prior to the procedure, including potential complications of headache, allergy, and pain. With the patient prone, the lower back was prepped  with Betadine. 1% Lidocaine was used for local anesthesia. Lumbar puncture was performed at the L3-4 level using a 20 gauge needle with return of clearCSF. 10 cc of methotrexate (12 mg) and hydrocortisone (50 mg) was injected into the subarachnoid space. The patient tolerated the procedure well without apparent complication. IMPRESSION: Fluoroscopic guided lumbar puncture and subsequent intrathecal injection of chemotherapy. Electronically Signed   By: Marijo Sanes M.D.   On: 06/13/2018 12:32    ASSESSMENT & PLAN:  62 y.o. male with  1. 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 No BM or CNS involvement.   04/18/18 PET/CT revealed Continued improvement in right colonic wall thickening and surrounding bulky masses consistent with treated lymphoma. No hypermetabolic activity to suggest residual tumor. There is some hypermetabolic activity associated with the sigmoid colon which demonstrates mild wall thickening, surrounding inflammation and underlying diverticulosis, suggesting mild diverticulitis. Suspected treatment related changes throughout the bone marrow. There is mildly increased activity within the clivus without clear corresponding finding on the CT images. Attention on follow-up recommended.  Nonobstructing right renal calculi. 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   06/09/18 PET/CT revealedSoft tissue lesion within the ventral pelvis is again identified demonstrating mild to moderate increased uptake within SUV max of 5.61. Deauville criteria 4. Concerning for residual metabolically active tumor. 2. Similar appearance of diffusely thickened appendix within the right lower quadrant of the abdomen. Findings may reflect treatment related changes. 3. Increased radiotracer uptake throughout the bone marrow which is favored to represent treatment related changes. 4. Lad coronary artery atherosclerotic calcifications.   2. RLE DVT and b/l PE  3.s/pGI bleeding from lymphoma involving bowels-- resolved at this time -- will need to monitor with anticoagulation.  PLAN: -Pt received his fourth and final planned dose of IT MTX uneventfully 06/13/18  -Discussed referring pt to Rad Onc for discussion after completing C6 which the pt prefers -Will give pt referral to PT which he would prefer to begin after completing C6 -Discussed pt labwork today, 07/03/18; HGB improved to 9.0, blood counts and chemistries are otherwise stable  -The pt has no prohibitive toxicities from continuing C6D1 EPOCH-R at this time. -Continue eating well, staying hydrated, and staying as active as reasonably possible  -Continue PO 64m Eliquis BID for his VTE -outpatient Rituxan and neulasta on 07/10/2018  4. DM2 with some increased hyperglycemia with steroids Plan -monitor  -continue Oral hypoglycemics and carb consistent diet at this time   5. DVT prophylaxis - patient on therapeutic Eliquis, ambulation, SCD  6. Chemotherapy related fatigue - maintain good po hydration and food intake and keeping physically active.  All of the patients questions were answered with apparent satisfaction. The patient knows to call the clinic with any problems, questions or concerns.  The total time spent in the appt was 70  minutes and more than 50% was on counseling and direct patient cares, chemotherapy order review and signing and co-ordination of care with nursing staff and pharmacy.    GSullivan LoneMD MCowenAAHIVMS SOrange Regional Medical CenterCBoyton Beach Ambulatory Surgery CenterHematology/Oncology Physician CPony  Center  (Office):       [phone] (Work cell):  [phone] (Fax):           [phone]  07/03/2018 4:24 PM  I, Baldwin [place], am acting as a scribe for [name]  .I have reviewed the above documentation for accuracy and completeness, and I agree with the above. Sullivan Lone MD MS

## 2018-07-04 LAB — BASIC METABOLIC PANEL
Anion gap: 5 (ref 5–15)
BUN: 11 mg/dL (ref 8–23)
CO2: 22 mmol/L (ref 22–32)
Calcium: 8.9 mg/dL (ref 8.9–10.3)
Chloride: 109 mmol/L (ref 98–111)
Creatinine, Ser: 0.64 mg/dL (ref 0.61–1.24)
GFR calc Af Amer: >60 mL/min (ref >60)
GFR calc non Af Amer: >60 mL/min (ref >60)
Glucose, Bld: 240 mg/dL — ABNORMAL HIGH (ref 70–99)
Potassium: 4.4 mmol/L (ref 3.5–5.1)
Sodium: 136 mmol/L (ref 135–145)

## 2018-07-04 LAB — CBC
HCT: 28.3 % — ABNORMAL LOW (ref 39.0–52.0)
Hemoglobin: 8.6 g/dL — ABNORMAL LOW (ref 13.0–17.0)
MCH: 30.6 pg (ref 26.0–34.0)
MCHC: 30.4 g/dL (ref 30.0–36.0)
MCV: 100.7 fL — ABNORMAL HIGH (ref 80.0–100.0)
Platelets: 443 10*3/uL — ABNORMAL HIGH (ref 150–400)
RBC: 2.81 MIL/uL — ABNORMAL LOW (ref 4.22–5.81)
RDW: 18.2 % — ABNORMAL HIGH (ref 11.5–15.5)
WBC: 5.4 10*3/uL (ref 4.0–10.5)
nRBC: 0 % (ref 0.0–0.2)

## 2018-07-04 MED ORDER — VINCRISTINE SULFATE CHEMO INJECTION 1 MG/ML
Freq: Once | INTRAVENOUS | Status: AC
  Administered 2018-07-04: 14:00:00 via INTRAVENOUS
  Filled 2018-07-04: qty 12

## 2018-07-04 MED ORDER — SODIUM CHLORIDE 0.9 % IV SOLN
Freq: Once | INTRAVENOUS | Status: AC
  Administered 2018-07-04: 18 mg via INTRAVENOUS
  Filled 2018-07-04: qty 4

## 2018-07-04 NOTE — Progress Notes (Signed)
Chemo calculations verified with Nancy Marus.

## 2018-07-04 NOTE — H&P (Addendum)
HEMATOLOGY/ONCOLOGY H&P NOTE  Date of Service: 07/03/2018 Patient Care Team: [name], MD as PCP - General (Family Medicine)  CHIEF COMPLAINTS/PURPOSE OF CONSULTATION:  C6 EPOCH-R treatment for Burkitt's Lymphoma  HISTORY OF PRESENTING ILLNESS:   Mark Frey is a wonderful 62 y.o. male who has been admitted today for C6 EPOCH-R treatment of his Burkitt's lymphoma. The pt reports that he is doing well overall.  He has tolerated treatment well thus far with no prohibitive toxicities though notes some cumulative fatigue. The pt reports that he is feeling a little better after his recent blood transfusion. He has had some mild constipation which he feels is is IBS, and notes that when he has moved his bowels they have been liquid, and denies mucous or blood in the stools. He notes that there is some discomfort associated with his bowel movements.No blood mucous in stools.  The pt notes that he has had intermittent mild nausea as well and has been eating well.   Lab results today (07/03/18) of CBC w/diff and CMP is as follows: all values are WNL except for RBC at 2.91, HGB at 9.0, HCT at 29.5, MCV at 101.4, RDW at 19.0, PLT at 436k, Monocytes abs at 1.2k, Glucose at 136, Total Protein at 6.3, Albumin at 3.3.  On review of systems, pt reports liquid stools, constipation, improved energy levels, eating well, intermittent nausea, some abdominal cramps, and denies mucous in the stools, blood in the stools, fevers, chills, nausea, concerns for bleeding, back pains, and any other symptoms.   MEDICAL HISTORY:  Past Medical History:  Diagnosis Date  . ALLERGIC RHINITIS   . Diabetes mellitus   . Hyperlipidemia     SURGICAL HISTORY: Past Surgical History:  Procedure Laterality Date  . BIOPSY  03/15/2018   Procedure: BIOPSY;  Surgeon: Milus Banister, MD;  Location: [facility];  Service: Endoscopy;;  . CLEFT PALATE REPAIR    . COLONOSCOPY N/A 03/15/2018   Procedure: COLONOSCOPY;   Surgeon: Milus Banister, MD;  Location: [facility];  Service: Endoscopy;  Laterality: N/A;  . deviated septum repair     slight improvement  . ESOPHAGOGASTRODUODENOSCOPY N/A 03/15/2018   Procedure: ESOPHAGOGASTRODUODENOSCOPY (EGD);  Surgeon: Milus Banister, MD;  Location: [name];  Service: Endoscopy;  Laterality: N/A;  . IR IMAGING GUIDED PORT INSERTION  03/17/2018  . TONSILLECTOMY      SOCIAL HISTORY: Social History   Socioeconomic History  . Marital status: Married    Spouse name: lisa  . Number of children: 0  . Years of education: Not on file  . Highest education level: Not on file  Occupational History  . Not on file  Social Needs  . Financial resource strain: Not hard at all  . Food insecurity:    Worry: Never true    Inability: Never true  . Transportation needs:    Medical: No    Non-medical: No  Tobacco Use  . Smoking status: Never Smoker  . Smokeless tobacco: Never Used  Substance and Sexual Activity  . Alcohol use: Yes    Comment: occasional  . Drug use: No  . Sexual activity: Yes  Lifestyle  . Physical activity:    Days per week: 0 days    Minutes per session: 0 min  . Stress: Not at all  Relationships  . Social connections:    Talks on phone: More than three times a week    Gets together: More than three times a week  Attends religious service: 1 to 4 times per year    Active member of club or organization: No    Attends meetings of clubs or organizations: Never    Relationship status: Married  . Intimate partner violence:    Fear of current or ex partner: No    Emotionally abused: No    Physically abused: No    Forced sexual activity: No  Other Topics Concern  . Not on file  Social History Narrative   Married 1985. No kids. 4 small dogs.       Works in Financial trader, residential      Hobbies: work on cars, Haematologist, exercise as able    FAMILY HISTORY: Family History  Problem Relation Age of Onset  . Lung cancer Mother         smoker  . Brain cancer Mother        metastasis  . AAA (abdominal aortic aneurysm) Father        smoker    ALLERGIES:  is allergic to ciprofloxacin.  MEDICATIONS:  Current Facility-Administered Medications  Medication Dose Route Frequency Provider Last Rate Last Dose  . 0.9 %  sodium chloride infusion   Intravenous Continuous [name], MD 10 mL/hr at 07/03/18 1803    . apixaban (ELIQUIS) tablet 5 mg  5 mg Oral BID [name], MD   5 mg at 07/03/18 2110  . DOXOrubicin (ADRIAMYCIN) 24 mg, etoposide (VEPESID) 120 mg, vinCRIStine (ONCOVIN) 1 mg in sodium chloride 0.9 % 1,000 mL chemo infusion   Intravenous Once [name], MD 51 mL/hr at 07/03/18 1613    . furosemide (LASIX) tablet 20 mg  20 mg Oral Daily PRN [name], MD      . lidocaine-prilocaine (EMLA) cream 1 application  1 application Topical PRN [name], MD      . linagliptin (TRADJENTA) tablet 5 mg  5 mg Oral QAC breakfast [name], MD       And  . metFORMIN (GLUCOPHAGE) tablet 500 mg  500 mg Oral BID WC [name], MD   500 mg at 07/03/18 1757  . predniSONE (DELTASONE) tablet 30 mg  30 mg Oral BID WC [name], MD   30 mg at 07/03/18 1757  . senna (SENOKOT) tablet 17.2 mg  2 tablet Oral BID PRN [name], MD   17.2 mg at 07/03/18 2037    REVIEW OF SYSTEMS:    10 Point review of Systems was done is negative except as noted above.  PHYSICAL EXAMINATION: ECOG PERFORMANCE STATUS: 1 - Symptomatic but completely ambulatory  . Vitals:   07/03/18 1439 07/03/18 2037  BP: 116/73 123/82  Pulse: (!) 103 96  Resp: 16 18  Temp: 98.5 F (36.9 C) 97.8 F (36.6 C)  SpO2: 97% 98%   Filed Weights   07/03/18 1020  Weight: 222 lb 8 oz (100.9 kg)   .Body mass index is 32.86 kg/m.  GENERAL:alert, in no acute distress and comfortable SKIN: no acute rashes, no significant lesions EYES: conjunctiva are pink and non-injected,  sclera anicteric OROPHARYNX: MMM, no exudates, no oropharyngeal erythema or ulceration NECK: supple, no JVD LYMPH:  no palpable lymphadenopathy in the cervical, axillary or inguinal regions LUNGS: clear to auscultation b/l with normal respiratory effort HEART: regular rate & rhythm ABDOMEN:  normoactive bowel sounds , non tender, not distended. Extremity: 1+ right pedal edema, no left pedal edema  PSYCH: alert & oriented x 3 with  fluent speech NEURO: no focal motor/sensory deficits  LABORATORY DATA:  I have reviewed the data as listed  . CBC Latest Ref Rng & Units 07/03/2018 06/27/2018 06/16/2018  WBC 4.0 - 10.5 K/uL 6.5 5.5 4.5  Hemoglobin 13.0 - 17.0 g/dL 9.0(L) 8.1(L) 8.8(L)  Hematocrit 39.0 - 52.0 % 29.5(L) 25.4(L) 26.2(L)  Platelets 150 - 400 K/uL 436(H) 243 389    . CMP Latest Ref Rng & Units 07/03/2018 06/27/2018 06/16/2018  Glucose 70 - 99 mg/dL 136(H) 203(H) 221(H)  BUN 8 - 23 mg/dL 9 13 20   Creatinine 0.61 - 1.24 mg/dL 0.68 0.84 0.75  Sodium 135 - 145 mmol/L 138 135 139  Potassium 3.5 - 5.1 mmol/L 3.8 3.9 3.7  Chloride 98 - 111 mmol/L 106 99 103  CO2 22 - 32 mmol/L 25 24 26   Calcium 8.9 - 10.3 mg/dL 9.4 10.2 9.6  Total Protein 6.5 - 8.1 g/dL 6.3(L) 6.8 -  Total Bilirubin 0.3 - 1.2 mg/dL 0.4 0.3 -  Alkaline Phos 38 - 126 U/L 46 60 -  AST 15 - 41 U/L 18 26 -  ALT 0 - 44 U/L 17 48(H) -     RADIOGRAPHIC STUDIES: I have personally reviewed the radiological images as listed and agreed with the findings in the report. Nm Pet Image Restag (ps) Skull Base To Thigh  Result Date: 06/09/2018 CLINICAL DATA:  Subsequent treatment strategy for lymphoma. EXAM: NUCLEAR MEDICINE PET SKULL BASE TO THIGH TECHNIQUE: 11.15 mCi F-18 FDG was injected intravenously. Full-ring PET imaging was performed from the skull base to thigh after the radiotracer. CT data was obtained and used for attenuation correction and anatomic localization. Fasting blood glucose: 173 mg/dl COMPARISON:   04/18/2018 FINDINGS: Mediastinal blood pool activity: SUV max 2.69 Liver activity: SUV max 3.47 NECK: No hypermetabolic lymph nodes in the neck. Incidental CT findings: none CHEST: No hypermetabolic mediastinal or hilar nodes. No suspicious pulmonary nodules on the CT scan. Incidental CT findings: Lad coronary artery calcification noted. ABDOMEN/PELVIS: No abnormal hypermetabolic activity within the liver, pancreas, adrenal glands, or spleen. No hypermetabolic lymph nodes in the abdomen or pelvis. Within the ventral aspect of the pelvis there is a soft tissue nodule with surrounding fat stranding measuring 2.7 by 2.1 cm within SUV max of 5.61, image 173/4. Deauville criteria 4. Previously this measured 2.8 x 2.6 cm within SUV max of 3.8. Incidental CT findings: The appendix is again noted and appears diffusely thickened measuring 1.3 cm in maximum thickness. Gas noted within the lumen of the appendix suggesting patency. SKELETON: No focal hypermetabolic activity to suggest skeletal metastasis.There is diffusely increased radiotracer activity throughout the axial and appendicular skeleton which is favored to represent treatment related changes Incidental CT findings: None IMPRESSION: 1. Soft tissue lesion within the ventral pelvis is again identified demonstrating mild to moderate increased uptake within SUV max of 5.61. Deauville criteria 4. Concerning for residual metabolically active tumor. 2. Similar appearance of diffusely thickened appendix within the right lower quadrant of the abdomen. Findings may reflect treatment related changes. 3. Increased radiotracer uptake throughout the bone marrow which is favored to represent treatment related changes. 4. Lad coronary artery atherosclerotic calcifications. Electronically Signed   By: Kerby Moors M.D.   On: 06/09/2018 13:06   Dg Fluoro Guided Loc Of Needle/cath Tip For Spinal Inject Rt  Result Date: 06/13/2018 CLINICAL DATA:  Lymphoma. EXAM: FLUOROSCOPICALLY  GUIDED LUMBAR PUNCTURE FOR INTRATHECAL CHEMOTHERAPY FLUOROSCOPY TIME:  0 minutes and 45 seconds. PROCEDURE: Informed consent was obtained from  the patient prior to the procedure, including potential complications of headache, allergy, and pain. With the patient prone, the lower back was prepped with Betadine. 1% Lidocaine was used for local anesthesia. Lumbar puncture was performed at the L3-4 level using a 20 gauge needle with return of clearCSF. 10 cc of methotrexate (12 mg) and hydrocortisone (50 mg) was injected into the subarachnoid space. The patient tolerated the procedure well without apparent complication. IMPRESSION: Fluoroscopic guided lumbar puncture and subsequent intrathecal injection of chemotherapy. Electronically Signed   By: Marijo Sanes M.D.   On: 06/13/2018 12:32    ASSESSMENT & PLAN:  62 y.o. male with  1. 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 No BM or CNS involvement.   04/18/18 PET/CT revealed Continued improvement in right colonic wall thickening and surrounding bulky masses consistent with treated lymphoma. No hypermetabolic activity to suggest residual tumor. There is some hypermetabolic activity associated with the sigmoid colon which demonstrates mild wall thickening, surrounding inflammation and underlying diverticulosis, suggesting mild diverticulitis. Suspected treatment related changes throughout the bone marrow. There is mildly increased  activity within the clivus without clear corresponding finding on the CT images. Attention on follow-up recommended. Nonobstructing right renal calculi. 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   06/09/18 PET/CT revealedSoft tissue lesion within the ventral pelvis is again identified demonstrating mild to moderate increased uptake within SUV max of 5.61. Deauville criteria 4. Concerning for residual metabolically active tumor. 2. Similar appearance of diffusely thickened appendix within the right lower quadrant of the abdomen. Findings may reflect treatment related changes. 3. Increased radiotracer uptake throughout the bone marrow which is favored to represent treatment related changes. 4. Lad coronary artery atherosclerotic calcifications.   2. RLE DVT and b/l PE  3.s/pGI bleeding from lymphoma involving bowels-- resolved at this time -- will need to monitor with anticoagulation.  PLAN: -Pt received his fourth and final planned dose of IT MTX uneventfully 06/13/18  -Discussed referring pt to Rad Onc for discussion after completing C6 which the pt prefers -Will give pt referral to PT which he would prefer to begin after completing C6 -Discussed pt labwork today, 07/03/18; HGB improved to 9.0, blood counts and chemistries are otherwise stable  -The pt has no prohibitive toxicities from continuing C6D1 EPOCH-R at this time. -Continue eating well, staying hydrated, and staying as active as reasonably possible  -Continue PO 64m Eliquis BID for his VTE -outpatient Rituxan and neulasta on 07/10/2018  4. DM2 with some increased hyperglycemia with steroids Plan -monitor  -continue Oral hypoglycemics and carb consistent diet at this time   5. DVT prophylaxis - patient on therapeutic Eliquis, ambulation, SCD  6. Chemotherapy related fatigue - maintain good po hydration and food intake and keeping physically active.  All of the patients questions were answered with apparent satisfaction. The  patient knows to call the clinic with any problems, questions or concerns.  The total time spent in the appt was 70 minutes and more than 50% was on counseling and direct patient cares, chemotherapy order review and signing and  co-ordination of care with nursing staff and pharmacy.    Sullivan Lone MD MS AAHIVMS [facility] Hematology/Oncology Physician [facility]  (Office):       [phone] (Work cell):  [phone] (Fax):           [phone]  07/04/2018 12:50 AM  I, Baldwin [place], am acting as a scribe for [name]  .I have reviewed the above documentation for accuracy and completeness, and I agree with the above. Sullivan Lone MD MS

## 2018-07-04 NOTE — Progress Notes (Signed)
Chemo dosages verified with Drue Dun, RN

## 2018-07-04 NOTE — Progress Notes (Signed)
HEMATOLOGY/ONCOLOGY INPATIENT PROGRESS NOTE  Date of Service: 07/04/2018  Inpatient Attending: .[name], MD   SUBJECTIVE:   Mark Frey reports that he is doing well overall and continues to tolerate C6 of EPOCH-R without concern.   The pt reports that he has been move his bowels better and his liquid stools have also resolved. He had a solid bowel movement this morning. He notes that his nausea has also resolved, his appetite has returned, and he has been eating well.   Lab results today (07/04/18) of CBC and BMP is as follows: all values are WNL except for RBC at 2.81, HGB at 8.6, HCT at 28.3, MCV at 100.7, RDW at 18.2, PLT at 443k, Glucose at 240.  On review of systems, pt reports strong appetite, normal bowel movements, eating well, good energy levels, and denies nausea, vomiting, nausea, abdominal pains, and any other symptoms.    OBJECTIVE:  NAD  PHYSICAL EXAMINATION: . Vitals:   07/03/18 1439 07/03/18 2037 07/04/18 0548 07/04/18 1455  BP: 116/73 123/82 120/84 119/78  Pulse: (!) 103 96 98 (!) 107  Resp: 16 18 18 16   Temp: 98.5 F (36.9 C) 97.8 F (36.6 C) 98.1 F (36.7 C) 98.8 F (37.1 C)  TempSrc: Oral Oral Oral Oral  SpO2: 97% 98% 97% 98%  Weight:       Filed Weights   07/03/18 1020  Weight: 222 lb 8 oz (100.9 kg)   .Body mass index is 32.86 kg/m.  GENERAL:alert, in no acute distress and comfortable SKIN: no acute rashes, no significant lesions EYES: conjunctiva are pink and non-injected, sclera anicteric OROPHARYNX: MMM, no exudates, no oropharyngeal erythema or ulceration NECK: supple, no JVD LYMPH:  no palpable lymphadenopathy in the cervical, axillary or inguinal regions LUNGS: clear to auscultation b/l with normal respiratory effort HEART: regular rate & rhythm ABDOMEN:  normoactive bowel sounds , non tender, not distended. No palpable hepatosplenomegaly.  Extremity: 1+ right pedal edema, no left pedal edema  PSYCH: alert &  oriented x 3 with fluent speech NEURO: no focal motor/sensory deficits   MEDICAL HISTORY:  Past Medical History:  Diagnosis Date  . ALLERGIC RHINITIS   . Diabetes mellitus   . Hyperlipidemia     SURGICAL HISTORY: Past Surgical History:  Procedure Laterality Date  . BIOPSY  03/15/2018   Procedure: BIOPSY;  Surgeon: Milus Banister, MD;  Location: [facility];  Service: Endoscopy;;  . CLEFT PALATE REPAIR    . COLONOSCOPY N/A 03/15/2018   Procedure: COLONOSCOPY;  Surgeon: Milus Banister, MD;  Location: [facility];  Service: Endoscopy;  Laterality: N/A;  . deviated septum repair     slight improvement  . ESOPHAGOGASTRODUODENOSCOPY N/A 03/15/2018   Procedure: ESOPHAGOGASTRODUODENOSCOPY (EGD);  Surgeon: Milus Banister, MD;  Location: [name];  Service: Endoscopy;  Laterality: N/A;  . IR IMAGING GUIDED PORT INSERTION  03/17/2018  . TONSILLECTOMY      SOCIAL HISTORY: Social History   Socioeconomic History  . Marital status: Married    Spouse name: Mark Frey  . Number of children: 0  . Years of education: Not on file  . Highest education level: Not on file  Occupational History  . Not on file  Social Needs  . Financial resource strain: Not hard at all  . Food insecurity:    Worry: Never true    Inability: Never true  . Transportation needs:    Medical: No    Non-medical: No  Tobacco Use  . Smoking status:  Never Smoker  . Smokeless tobacco: Never Used  Substance and Sexual Activity  . Alcohol use: Yes    Comment: occasional  . Drug use: No  . Sexual activity: Yes  Lifestyle  . Physical activity:    Days per week: 0 days    Minutes per session: 0 min  . Stress: Not at all  Relationships  . Social connections:    Talks on phone: More than three times a week    Gets together: More than three times a week    Attends religious service: 1 to 4 times per year    Active member of club or organization: No    Attends meetings of clubs or organizations: Never     Relationship status: Married  . Intimate partner violence:    Fear of current or ex partner: No    Emotionally abused: No    Physically abused: No    Forced sexual activity: No  Other Topics Concern  . Not on file  Social History Narrative   Married 1985. No kids. 4 small dogs.       Works in Financial trader, residential      Hobbies: work on cars, Haematologist, exercise as able    FAMILY HISTORY: Family History  Problem Relation Age of Onset  . Lung cancer Mother        smoker  . Brain cancer Mother        metastasis  . AAA (abdominal aortic aneurysm) Father        smoker    ALLERGIES:  is allergic to ciprofloxacin.  MEDICATIONS:  Scheduled Meds: . apixaban  5 mg Oral BID  . DOXOrubicin/vinCRIStine/etoposide CHEMO IV infusion for Inpatient CI   Intravenous Once  . linagliptin  5 mg Oral QAC breakfast   And  . metFORMIN  500 mg Oral BID WC  . predniSONE  30 mg Oral BID WC   Continuous Infusions: . sodium chloride 10 mL/hr at 07/04/18 1000   PRN Meds:.furosemide, lidocaine-prilocaine, senna  REVIEW OF SYSTEMS:    A 10+ POINT REVIEW OF SYSTEMS WAS OBTAINED including neurology, dermatology, psychiatry, cardiac, respiratory, lymph, extremities, GI, GU, Musculoskeletal, constitutional, breasts, reproductive, HEENT.  All pertinent positives are noted in the HPI.  All others are negative.   LABORATORY DATA:  I have reviewed the data as listed  . CBC Latest Ref Rng & Units 07/04/2018 07/03/2018 06/27/2018  WBC 4.0 - 10.5 K/uL 5.4 6.5 5.5  Hemoglobin 13.0 - 17.0 g/dL 8.6(L) 9.0(L) 8.1(L)  Hematocrit 39.0 - 52.0 % 28.3(L) 29.5(L) 25.4(L)  Platelets 150 - 400 K/uL 443(H) 436(H) 243    . CMP Latest Ref Rng & Units 07/04/2018 07/03/2018 06/27/2018  Glucose 70 - 99 mg/dL 240(H) 136(H) 203(H)  BUN 8 - 23 mg/dL 11 9 13   Creatinine 0.61 - 1.24 mg/dL 0.64 0.68 0.84  Sodium 135 - 145 mmol/L 136 138 135  Potassium 3.5 - 5.1 mmol/L 4.4 3.8 3.9  Chloride 98 - 111 mmol/L  109 106 99  CO2 22 - 32 mmol/L 22 25 24   Calcium 8.9 - 10.3 mg/dL 8.9 9.4 10.2  Total Protein 6.5 - 8.1 g/dL - 6.3(L) 6.8  Total Bilirubin 0.3 - 1.2 mg/dL - 0.4 0.3  Alkaline Phos 38 - 126 U/L - 46 60  AST 15 - 41 U/L - 18 26  ALT 0 - 44 U/L - 17 48(H)     RADIOGRAPHIC STUDIES: I have personally reviewed the radiological images as listed and agreed with  the findings in the report. Nm Pet Image Restag (ps) Skull Base To Thigh  Result Date: 06/09/2018 CLINICAL DATA:  Subsequent treatment strategy for lymphoma. EXAM: NUCLEAR MEDICINE PET SKULL BASE TO THIGH TECHNIQUE: 11.15 mCi F-18 FDG was injected intravenously. Full-ring PET imaging was performed from the skull base to thigh after the radiotracer. CT data was obtained and used for attenuation correction and anatomic localization. Fasting blood glucose: 173 mg/dl COMPARISON:  04/18/2018 FINDINGS: Mediastinal blood pool activity: SUV max 2.69 Liver activity: SUV max 3.47 NECK: No hypermetabolic lymph nodes in the neck. Incidental CT findings: none CHEST: No hypermetabolic mediastinal or hilar nodes. No suspicious pulmonary nodules on the CT scan. Incidental CT findings: Lad coronary artery calcification noted. ABDOMEN/PELVIS: No abnormal hypermetabolic activity within the liver, pancreas, adrenal glands, or spleen. No hypermetabolic lymph nodes in the abdomen or pelvis. Within the ventral aspect of the pelvis there is a soft tissue nodule with surrounding fat stranding measuring 2.7 by 2.1 cm within SUV max of 5.61, image 173/4. Deauville criteria 4. Previously this measured 2.8 x 2.6 cm within SUV max of 3.8. Incidental CT findings: The appendix is again noted and appears diffusely thickened measuring 1.3 cm in maximum thickness. Gas noted within the lumen of the appendix suggesting patency. SKELETON: No focal hypermetabolic activity to suggest skeletal metastasis.There is diffusely increased radiotracer activity throughout the axial and appendicular  skeleton which is favored to represent treatment related changes Incidental CT findings: None IMPRESSION: 1. Soft tissue lesion within the ventral pelvis is again identified demonstrating mild to moderate increased uptake within SUV max of 5.61. Deauville criteria 4. Concerning for residual metabolically active tumor. 2. Similar appearance of diffusely thickened appendix within the right lower quadrant of the abdomen. Findings may reflect treatment related changes. 3. Increased radiotracer uptake throughout the bone marrow which is favored to represent treatment related changes. 4. Lad coronary artery atherosclerotic calcifications. Electronically Signed   By: Kerby Moors M.D.   On: 06/09/2018 13:06   Dg Fluoro Guided Loc Of Needle/cath Tip For Spinal Inject Rt  Result Date: 06/13/2018 CLINICAL DATA:  Lymphoma. EXAM: FLUOROSCOPICALLY GUIDED LUMBAR PUNCTURE FOR INTRATHECAL CHEMOTHERAPY FLUOROSCOPY TIME:  0 minutes and 45 seconds. PROCEDURE: Informed consent was obtained from the patient prior to the procedure, including potential complications of headache, allergy, and pain. With the patient prone, the lower back was prepped with Betadine. 1% Lidocaine was used for local anesthesia. Lumbar puncture was performed at the L3-4 level using a 20 gauge needle with return of clearCSF. 10 cc of methotrexate (12 mg) and hydrocortisone (50 mg) was injected into the subarachnoid space. The patient tolerated the procedure well without apparent complication. IMPRESSION: Fluoroscopic guided lumbar puncture and subsequent intrathecal injection of chemotherapy. Electronically Signed   By: Marijo Sanes M.D.   On: 06/13/2018 12:32    ASSESSMENT & PLAN:  62 y.o. male with   1. 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 No BM or CNS involvement.   04/18/18 PET/CT revealed Continued improvement in right colonic wall thickening and surrounding bulky  masses consistent with treated lymphoma. No hypermetabolic activity to suggest residual tumor. There is some hypermetabolic activity associated with the sigmoid colon which demonstrates mild wall thickening, surrounding inflammation and underlying diverticulosis, suggesting mild diverticulitis. Suspected treatment related changes throughout the bone marrow. There is mildly increased activity within the clivus without clear corresponding finding on the CT images. Attention on follow-up recommended. Nonobstructing right renal calculi. 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   06/09/18 PET/CT revealedSoft tissue lesion within the ventral pelvis is again identified demonstrating mild to moderate increased uptake within SUV max of 5.61. Deauville criteria 4. Concerning for residual metabolically active tumor. 2. Similar appearance of diffusely thickened appendix within the right lower quadrant of the abdomen. Findings may reflect treatment related changes. 3. Increased radiotracer uptake throughout the bone marrow which is favored to represent treatment related changes. 4. Lad coronary artery atherosclerotic calcifications.   2. RLE DVT and b/l PE  3.s/pGI bleeding from lymphoma involving bowels-- resolved at this time -- will need to monitor with anticoagulation.  PLAN:  -Discussed pt labwork today, 07/04/18; blood counts and chemistries are stable  -The pt has no prohibitive toxicities from continuing C6D2 EPOCH-R at this time.   -Continue eating  well, staying hydrated, and staying as active as reasonably possible  -Will give pt referral to PT which he would prefer to begin after completing C6 -Continue PO 48m Eliquis BID for his VTE -outpatient Rituxan and neulasta on 07/10/2018 -rad onc referral after post C6 PET/CT  4. DM2 with some increased hyperglycemia with steroids Plan -monitor  -continue Oral hypoglycemics and carb consistent diet at this time   5. DVT prophylaxis - patient on therapeutic Eliquis, ambulation, SCD  6. Chemotherapy related fatigue - maintain good po hydration and food intake and keeping physically active.   The total time spent in the appt was 25 minutes and more than 50% was on counseling and direct patient cares.    GSullivan LoneMD MS AAHIVMS SHutchinson Ambulatory Surgery Center LLCCVan Wert County HospitalHematology/Oncology Physician CSt [facility] (Office):       [phone](Work cell):  [phone](Fax):           [phone] 07/04/2018 3:40 PM   I, SBaldwin Jamaica am acting as a scribe for Dr. KIrene Limbo .I have reviewed the above documentation for accuracy and completeness, and I agree with the above. GSullivan LoneMD MS

## 2018-07-05 LAB — BASIC METABOLIC PANEL
Anion gap: 9 (ref 5–15)
BUN: 15 mg/dL (ref 8–23)
CALCIUM: 9.2 mg/dL (ref 8.9–10.3)
CHLORIDE: 108 mmol/L (ref 98–111)
CO2: 22 mmol/L (ref 22–32)
CREATININE: 0.75 mg/dL (ref 0.61–1.24)
GFR calc Af Amer: >60 mL/min (ref >60)
GFR calc non Af Amer: >60 mL/min (ref >60)
GLUCOSE: 244 mg/dL — AB (ref 70–99)
Potassium: 4.2 mmol/L (ref 3.5–5.1)
Sodium: 139 mmol/L (ref 135–145)

## 2018-07-05 LAB — CBC
HEMATOCRIT: 28.8 % — AB (ref 39.0–52.0)
Hemoglobin: 8.7 g/dL — ABNORMAL LOW (ref 13.0–17.0)
MCH: 30.6 pg (ref 26.0–34.0)
MCHC: 30.2 g/dL (ref 30.0–36.0)
MCV: 101.4 fL — AB (ref 80.0–100.0)
NRBC: 0 % (ref 0.0–0.2)
PLATELETS: 535 10*3/uL — AB (ref 150–400)
RBC: 2.84 MIL/uL — ABNORMAL LOW (ref 4.22–5.81)
RDW: 17.9 % — AB (ref 11.5–15.5)
WBC: 5.1 10*3/uL (ref 4.0–10.5)

## 2018-07-05 MED ORDER — SODIUM CHLORIDE 0.9 % IV SOLN
Freq: Once | INTRAVENOUS | Status: AC
  Administered 2018-07-05: 8 mg via INTRAVENOUS
  Filled 2018-07-05: qty 4

## 2018-07-05 MED ORDER — VINCRISTINE SULFATE CHEMO INJECTION 1 MG/ML
Freq: Once | INTRAVENOUS | Status: AC
  Administered 2018-07-05: 12:00:00 via INTRAVENOUS
  Filled 2018-07-05: qty 12

## 2018-07-05 MED ORDER — HEPARIN SOD (PORK) LOCK FLUSH 100 UNIT/ML IV SOLN
500.0000 [IU] | INTRAVENOUS | Status: AC | PRN
  Administered 2018-07-07: 500 [IU]
  Filled 2018-07-05: qty 5

## 2018-07-05 NOTE — Progress Notes (Signed)
Inpatient Diabetes Program Recommendations  AACE/ADA: New Consensus Statement on Inpatient Glycemic Control (2019)  Target Ranges:  Prepandial:   less than 140 mg/dL      Peak postprandial:   less than 180 mg/dL (1-2 hours)      Critically ill patients:  140 - 180 mg/dL   Results for TITUS, DRONE (MRN [id number]) as of 07/05/2018 14:33  Ref. Range 07/04/2018 05:04 07/05/2018 05:15  Glucose Latest Ref Range: 70 - 99 mg/dL 240 (H) 244 (H)   Review of Glycemic Control  Diabetes history: DM2 Outpatient Diabetes medications: Janumet 50-1000  BID Current orders for Inpatient glycemic control: Tradjenta 5 mg QAM, Metformin 500 mg BID; Prednisone 30 mg BID  Inpatient Diabetes Program Recommendations: Correction (SSI): While inpatient, please consider ordering CBGs with Novolog 0-15 units TID with meals and Novolog 0-5 units QHS.  Thanks, Barnie Alderman, RN, MSN, CDE Diabetes Coordinator Inpatient Diabetes Program [phone] (Team Pager from 8am to 5pm)

## 2018-07-05 NOTE — Progress Notes (Signed)
HEMATOLOGY/ONCOLOGY INPATIENT PROGRESS NOTE  Date of Service: 07/06/2018  Inpatient Attending: .[name], MD   SUBJECTIVE:   Mark Frey reports that he is doing well overall.   The pt reports that he has not had any new concerns. He notes that he has continued eating well, has been ambulating periodically, and has been moving his bowels without difficulty. He adds that his ankle swelling has been stable to improved, and has been taking Lasix each day.   Lab results today (07/06/18) of CBC and BMP is as follows: all values are WNL except for RBC at 2.69, HGB at 8.4, HCT at 26.9, RDW at 17.5, PLT at 510k, Glucose at 207  On review of systems, pt reports eating well, moving his bowels well, stable ankle swelling, and denies fevers, chills, night sweats, abdominal pains, scrotal pain or swelling, and any other symptoms.    OBJECTIVE:  NAD  PHYSICAL EXAMINATION: . Vitals:   07/05/18 1353 07/05/18 2047 07/06/18 0524 07/06/18 1518  BP: 118/79 131/81 (!) 134/91 129/87  Pulse: (!) 108 90 89 (!) 103  Resp: 18 20 16 17   Temp: 98.2 F (36.8 C) 98 F (36.7 C) 98 F (36.7 C) 98.2 F (36.8 C)  TempSrc: Oral Oral Oral Oral  SpO2: 98% 99% 99% 100%  Weight:       Filed Weights   07/03/18 1020  Weight: 222 lb 8 oz (100.9 kg)   .Body mass index is 32.86 kg/m.  GENERAL:alert, in no acute distress and comfortable SKIN: no acute rashes, no significant lesions EYES: conjunctiva are pink and non-injected, sclera anicteric OROPHARYNX: MMM, no exudates, no oropharyngeal erythema or ulceration NECK: supple, no JVD LYMPH:  no palpable lymphadenopathy in the cervical, axillary or inguinal regions LUNGS: clear to auscultation b/l with normal respiratory effort HEART: regular rate & rhythm ABDOMEN:  normoactive bowel sounds , non tender, not distended. No palpable hepatosplenomegaly.  Extremity: 1+ right pedal edema, no left pedal edema  PSYCH: alert & oriented x 3 with  fluent speech NEURO: no focal motor/sensory deficits   MEDICAL HISTORY:  Past Medical History:  Diagnosis Date  . ALLERGIC RHINITIS   . Diabetes mellitus   . Hyperlipidemia     SURGICAL HISTORY: Past Surgical History:  Procedure Laterality Date  . BIOPSY  03/15/2018   Procedure: BIOPSY;  Surgeon: Milus Banister, MD;  Location: [facility];  Service: Endoscopy;;  . CLEFT PALATE REPAIR    . COLONOSCOPY N/A 03/15/2018   Procedure: COLONOSCOPY;  Surgeon: Milus Banister, MD;  Location: [facility];  Service: Endoscopy;  Laterality: N/A;  . deviated septum repair     slight improvement  . ESOPHAGOGASTRODUODENOSCOPY N/A 03/15/2018   Procedure: ESOPHAGOGASTRODUODENOSCOPY (EGD);  Surgeon: Milus Banister, MD;  Location: [name];  Service: Endoscopy;  Laterality: N/A;  . IR IMAGING GUIDED PORT INSERTION  03/17/2018  . TONSILLECTOMY      SOCIAL HISTORY: Social History   Socioeconomic History  . Marital status: Married    Spouse name: lisa  . Number of children: 0  . Years of education: Not on file  . Highest education level: Not on file  Occupational History  . Not on file  Social Needs  . Financial resource strain: Not hard at all  . Food insecurity:    Worry: Never true    Inability: Never true  . Transportation needs:    Medical: No    Non-medical: No  Tobacco Use  . Smoking status: Never Smoker  .  Smokeless tobacco: Never Used  Substance and Sexual Activity  . Alcohol use: Yes    Comment: occasional  . Drug use: No  . Sexual activity: Yes  Lifestyle  . Physical activity:    Days per week: 0 days    Minutes per session: 0 min  . Stress: Not at all  Relationships  . Social connections:    Talks on phone: More than three times a week    Gets together: More than three times a week    Attends religious service: 1 to 4 times per year    Active member of club or organization: No    Attends meetings of clubs or organizations: Never    Relationship status:  Married  . Intimate partner violence:    Fear of current or ex partner: No    Emotionally abused: No    Physically abused: No    Forced sexual activity: No  Other Topics Concern  . Not on file  Social History Narrative   Married 1985. No kids. 4 small dogs.       Works in Financial trader, residential      Hobbies: work on cars, Haematologist, exercise as able    FAMILY HISTORY: Family History  Problem Relation Age of Onset  . Lung cancer Mother        smoker  . Brain cancer Mother        metastasis  . AAA (abdominal aortic aneurysm) Father        smoker    ALLERGIES:  is allergic to ciprofloxacin.  MEDICATIONS:  Scheduled Meds: . apixaban  5 mg Oral BID  . DOXOrubicin/vinCRIStine/etoposide CHEMO IV infusion for Inpatient CI   Intravenous Once  . linagliptin  5 mg Oral QAC breakfast   And  . metFORMIN  500 mg Oral BID WC  . predniSONE  30 mg Oral BID WC   Continuous Infusions: . sodium chloride 10 mL/hr at 07/05/18 0646   PRN Meds:.furosemide, heparin lock flush, lidocaine-prilocaine, senna  REVIEW OF SYSTEMS:    A 10+ POINT REVIEW OF SYSTEMS WAS OBTAINED including neurology, dermatology, psychiatry, cardiac, respiratory, lymph, extremities, GI, GU, Musculoskeletal, constitutional, breasts, reproductive, HEENT.  All pertinent positives are noted in the HPI.  All others are negative.   LABORATORY DATA:  I have reviewed the data as listed  . CBC Latest Ref Rng & Units 07/06/2018 07/05/2018 07/04/2018  WBC 4.0 - 10.5 K/uL 4.7 5.1 5.4  Hemoglobin 13.0 - 17.0 g/dL 8.4(L) 8.7(L) 8.6(L)  Hematocrit 39.0 - 52.0 % 26.9(L) 28.8(L) 28.3(L)  Platelets 150 - 400 K/uL 510(H) 535(H) 443(H)    . CMP Latest Ref Rng & Units 07/06/2018 07/05/2018 07/04/2018  Glucose 70 - 99 mg/dL 207(H) 244(H) 240(H)  BUN 8 - 23 mg/dL 20 15 11   Creatinine 0.61 - 1.24 mg/dL 0.69 0.75 0.64  Sodium 135 - 145 mmol/L 137 139 136  Potassium 3.5 - 5.1 mmol/L 3.9 4.2 4.4  Chloride 98 - 111  mmol/L 104 108 109  CO2 22 - 32 mmol/L 24 22 22   Calcium 8.9 - 10.3 mg/dL 9.3 9.2 8.9  Total Protein 6.5 - 8.1 g/dL - - -  Total Bilirubin 0.3 - 1.2 mg/dL - - -  Alkaline Phos 38 - 126 U/L - - -  AST 15 - 41 U/L - - -  ALT 0 - 44 U/L - - -     RADIOGRAPHIC STUDIES: I have personally reviewed the radiological images as listed and agreed with the  findings in the report. Nm Pet Image Restag (ps) Skull Base To Thigh  Result Date: 06/09/2018 CLINICAL DATA:  Subsequent treatment strategy for lymphoma. EXAM: NUCLEAR MEDICINE PET SKULL BASE TO THIGH TECHNIQUE: 11.15 mCi F-18 FDG was injected intravenously. Full-ring PET imaging was performed from the skull base to thigh after the radiotracer. CT data was obtained and used for attenuation correction and anatomic localization. Fasting blood glucose: 173 mg/dl COMPARISON:  04/18/2018 FINDINGS: Mediastinal blood pool activity: SUV max 2.69 Liver activity: SUV max 3.47 NECK: No hypermetabolic lymph nodes in the neck. Incidental CT findings: none CHEST: No hypermetabolic mediastinal or hilar nodes. No suspicious pulmonary nodules on the CT scan. Incidental CT findings: Lad coronary artery calcification noted. ABDOMEN/PELVIS: No abnormal hypermetabolic activity within the liver, pancreas, adrenal glands, or spleen. No hypermetabolic lymph nodes in the abdomen or pelvis. Within the ventral aspect of the pelvis there is a soft tissue nodule with surrounding fat stranding measuring 2.7 by 2.1 cm within SUV max of 5.61, image 173/4. Deauville criteria 4. Previously this measured 2.8 x 2.6 cm within SUV max of 3.8. Incidental CT findings: The appendix is again noted and appears diffusely thickened measuring 1.3 cm in maximum thickness. Gas noted within the lumen of the appendix suggesting patency. SKELETON: No focal hypermetabolic activity to suggest skeletal metastasis.There is diffusely increased radiotracer activity throughout the axial and appendicular skeleton which  is favored to represent treatment related changes Incidental CT findings: None IMPRESSION: 1. Soft tissue lesion within the ventral pelvis is again identified demonstrating mild to moderate increased uptake within SUV max of 5.61. Deauville criteria 4. Concerning for residual metabolically active tumor. 2. Similar appearance of diffusely thickened appendix within the right lower quadrant of the abdomen. Findings may reflect treatment related changes. 3. Increased radiotracer uptake throughout the bone marrow which is favored to represent treatment related changes. 4. Lad coronary artery atherosclerotic calcifications. Electronically Signed   By: Kerby Moors M.D.   On: 06/09/2018 13:06   Dg Fluoro Guided Loc Of Needle/cath Tip For Spinal Inject Rt  Result Date: 06/13/2018 CLINICAL DATA:  Lymphoma. EXAM: FLUOROSCOPICALLY GUIDED LUMBAR PUNCTURE FOR INTRATHECAL CHEMOTHERAPY FLUOROSCOPY TIME:  0 minutes and 45 seconds. PROCEDURE: Informed consent was obtained from the patient prior to the procedure, including potential complications of headache, allergy, and pain. With the patient prone, the lower back was prepped with Betadine. 1% Lidocaine was used for local anesthesia. Lumbar puncture was performed at the L3-4 level using a 20 gauge needle with return of clearCSF. 10 cc of methotrexate (12 mg) and hydrocortisone (50 mg) was injected into the subarachnoid space. The patient tolerated the procedure well without apparent complication. IMPRESSION: Fluoroscopic guided lumbar puncture and subsequent intrathecal injection of chemotherapy. Electronically Signed   By: Marijo Sanes M.D.   On: 06/13/2018 12:32    ASSESSMENT & PLAN:  62 y.o. male with   1. 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 No BM or CNS involvement.   04/18/18 PET/CT revealed Continued improvement in right colonic wall thickening and surrounding bulky masses  consistent with treated lymphoma. No hypermetabolic activity to suggest residual tumor. There is some hypermetabolic activity associated with the sigmoid colon which demonstrates mild wall thickening, surrounding inflammation and underlying diverticulosis, suggesting mild diverticulitis. Suspected treatment related changes throughout the bone marrow. There is mildly increased activity within the clivus without clear corresponding finding on the CT images. Attention on follow-up recommended. Nonobstructing right renal calculi. 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   06/09/18 PET/CT revealedSoft tissue lesion within the ventral pelvis is again identified demonstrating mild to moderate increased uptake within SUV max of 5.61. Deauville criteria 4. Concerning for residual metabolically active tumor. 2. Similar appearance of diffusely thickened appendix within the right lower quadrant of the abdomen. Findings may reflect treatment related changes. 3. Increased radiotracer uptake throughout the bone marrow which is favored to represent treatment related changes. 4. Lad coronary artery atherosclerotic calcifications.   2. RLE DVT and b/l PE  3.s/pGI bleeding from lymphoma involving bowels-- resolved at this time -- will need to monitor with anticoagulation.  PLAN:  -Discussed pt labwork today, 07/06/18; blood counts and chemistries are stable  -The pt has no prohibitive toxicities from continuing [place] at this time.   -Continue eating well,  staying hydrated, and staying as active as reasonably possible  -Continue Lasix  -Continue PO 54m Eliquis BID for his VTE -outpatient Rituxan and neulasta on 07/10/2018 -outpatient PET/CT as scheduled on 07/28/2018  4. DM2 with some increased hyperglycemia with steroids Plan -monitor  -continue Oral hypoglycemics and carb consistent diet at this time   5. DVT prophylaxis - patient on therapeutic Eliquis, ambulation, SCD  6. Chemotherapy related fatigue - maintain good po hydration and food intake and keeping physically active.   The total time spent in the appt was 25 minutes and more than 50% was on counseling and direct patient cares.    GSullivan LoneMD MS AAHIVMS SCommunity Memorial HospitalCCommunity [facility] Healthcare Of [facility] Dba Medical CenterHematology/Oncology Physician CPhys[facility] (Office):       [phone](Work cell):  [phone](Fax):           [phone] 07/06/2018 4:17 PM   I, SBaldwin Jamaica am acting as a scribe for Dr. KIrene Limbo .I have reviewed the above documentation for accuracy and completeness, and I agree with the above. GSullivan LoneMD MS

## 2018-07-06 LAB — CBC
HEMATOCRIT: 26.9 % — AB (ref 39.0–52.0)
Hemoglobin: 8.4 g/dL — ABNORMAL LOW (ref 13.0–17.0)
MCH: 31.2 pg (ref 26.0–34.0)
MCHC: 31.2 g/dL (ref 30.0–36.0)
MCV: 100 fL (ref 80.0–100.0)
NRBC: 0 % (ref 0.0–0.2)
Platelets: 510 10*3/uL — ABNORMAL HIGH (ref 150–400)
RBC: 2.69 MIL/uL — ABNORMAL LOW (ref 4.22–5.81)
RDW: 17.5 % — ABNORMAL HIGH (ref 11.5–15.5)
WBC: 4.7 10*3/uL (ref 4.0–10.5)

## 2018-07-06 LAB — BASIC METABOLIC PANEL
Anion gap: 9 (ref 5–15)
BUN: 20 mg/dL (ref 8–23)
CHLORIDE: 104 mmol/L (ref 98–111)
CO2: 24 mmol/L (ref 22–32)
CREATININE: 0.69 mg/dL (ref 0.61–1.24)
Calcium: 9.3 mg/dL (ref 8.9–10.3)
Glucose, Bld: 207 mg/dL — ABNORMAL HIGH (ref 70–99)
POTASSIUM: 3.9 mmol/L (ref 3.5–5.1)
SODIUM: 137 mmol/L (ref 135–145)

## 2018-07-06 MED ORDER — VINCRISTINE SULFATE CHEMO INJECTION 1 MG/ML
Freq: Once | INTRAVENOUS | Status: AC
  Administered 2018-07-06: 11:00:00 via INTRAVENOUS
  Filled 2018-07-06: qty 12

## 2018-07-06 MED ORDER — SODIUM CHLORIDE 0.9 % IV SOLN
Freq: Once | INTRAVENOUS | Status: AC
  Administered 2018-07-06: 18 mg via INTRAVENOUS
  Filled 2018-07-06: qty 4

## 2018-07-07 ENCOUNTER — Telehealth: Payer: Self-pay | Admitting: *Deleted

## 2018-07-07 DIAGNOSIS — E1165 Type 2 diabetes mellitus with hyperglycemia: Secondary | ICD-10-CM

## 2018-07-07 DIAGNOSIS — I82401 Acute embolism and thrombosis of unspecified deep veins of right lower extremity: Secondary | ICD-10-CM

## 2018-07-07 DIAGNOSIS — K922 Gastrointestinal hemorrhage, unspecified: Secondary | ICD-10-CM

## 2018-07-07 DIAGNOSIS — I2699 Other pulmonary embolism without acute cor pulmonale: Secondary | ICD-10-CM

## 2018-07-07 LAB — BASIC METABOLIC PANEL
Anion gap: 8 (ref 5–15)
BUN: 20 mg/dL (ref 8–23)
CALCIUM: 9.2 mg/dL (ref 8.9–10.3)
CO2: 26 mmol/L (ref 22–32)
CREATININE: 0.68 mg/dL (ref 0.61–1.24)
Chloride: 103 mmol/L (ref 98–111)
GFR calc Af Amer: >60 mL/min (ref >60)
GFR calc non Af Amer: >60 mL/min (ref >60)
Glucose, Bld: 186 mg/dL — ABNORMAL HIGH (ref 70–99)
Potassium: 3.6 mmol/L (ref 3.5–5.1)
Sodium: 137 mmol/L (ref 135–145)

## 2018-07-07 LAB — CBC
HCT: 26.5 % — ABNORMAL LOW (ref 39.0–52.0)
Hemoglobin: 8.2 g/dL — ABNORMAL LOW (ref 13.0–17.0)
MCH: 30.4 pg (ref 26.0–34.0)
MCHC: 30.9 g/dL (ref 30.0–36.0)
MCV: 98.1 fL (ref 80.0–100.0)
PLATELETS: 482 10*3/uL — AB (ref 150–400)
RBC: 2.7 MIL/uL — ABNORMAL LOW (ref 4.22–5.81)
RDW: 17.2 % — AB (ref 11.5–15.5)
WBC: 3.3 10*3/uL — ABNORMAL LOW (ref 4.0–10.5)
nRBC: 0 % (ref 0.0–0.2)

## 2018-07-07 MED ORDER — SODIUM CHLORIDE 0.9 % IV SOLN
750.0000 mg/m2 | Freq: Once | INTRAVENOUS | Status: AC
  Administered 2018-07-07: 1800 mg via INTRAVENOUS
  Filled 2018-07-07: qty 90

## 2018-07-07 MED ORDER — SODIUM CHLORIDE 0.9 % IV SOLN
Freq: Once | INTRAVENOUS | Status: AC
  Administered 2018-07-07: 36 mg via INTRAVENOUS
  Filled 2018-07-07: qty 8

## 2018-07-07 NOTE — Telephone Encounter (Signed)
Spouse called for clarification of Magnesium. RX is 500 mg capsule, take 1 capsule (500 mg total)  2 times daily. Spouse wants to know if the daily total is 516m or 1008m Per Dr. KaIrene Limbodaily total is 100033m 1 capsule 2 times day. Contacted spouse and clarification provided. Spouse verbalized understanding and thanked caller.

## 2018-07-07 NOTE — Discharge Summary (Signed)
[place]  [phone] [phone]    Physician Discharge Summary     Patient ID: Mark Frey MRN: [id number] DOB/AGE: 13-Nov-1955 62 y.o.  Admit date: 07/03/2018 Discharge date: 07/07/2018  Primary Care Physician:  [name], MD  Discharge Diagnoses:  Burkitts lymphoma admitted for C6 of EPOCH-R chemotherapy  Present on Admission: . Burkitt lymphoma, lymph nodes of multiple sites [facility])   Discharge Medications:  Allergies as of 07/07/2018      Reactions   Ciprofloxacin Other (See Comments)   Leg tingling      Medication List    TAKE these medications   apixaban 5 MG Tabs tablet Commonly known as:  ELIQUIS Take 1 tablet (5 mg total) by mouth 2 (two) times daily.   blood glucose meter kit and supplies Dispense based on patient and insurance preference. Use daily as directed. (E11.9).   Choline Fenofibrate 135 MG capsule TAKE 1 CAPSULE DAILY What changed:  when to take this   furosemide 20 MG tablet Commonly known as:  LASIX TAKE 1 TABLET BY MOUTH DAILY HOLD AFTER 10-15 DAYS IF LEG SWELLING RESOLVED OR IF >10LBS WEIGHT LOSS What changed:  See the new instructions.   glucose blood test strip Use to check blood sugar daily   JANUMET 50-1000 MG tablet Generic drug:  sitaGLIPtin-metformin TAKE 1 TABLET TWICE DAILY  WITH MEALS   lidocaine-prilocaine cream Commonly known as:  EMLA Apply 1 application topically as needed.   Magnesium 500 MG Caps Take 1 capsule (500 mg total) by mouth 2 (two) times daily. What changed:  when to take this   ondansetron 8 MG tablet Commonly known as:  ZOFRAN Take 1 tablet (8 mg total) by mouth every 8 (eight) hours as needed for nausea or vomiting.   senna-docusate 8.6-50 MG tablet Commonly known as:  Senokot-S Take 2 tablets by mouth at bedtime. What changed:    when to take this  reasons to take this        Disposition and Follow-up:   Significant  Diagnostic Studies:  Nm Pet Image Restag (ps) Skull Base To Thigh  Result Date: 06/09/2018 CLINICAL DATA:  Subsequent treatment strategy for lymphoma. EXAM: NUCLEAR MEDICINE PET SKULL BASE TO THIGH TECHNIQUE: 11.15 mCi F-18 FDG was injected intravenously. Full-ring PET imaging was performed from the skull base to thigh after the radiotracer. CT data was obtained and used for attenuation correction and anatomic localization. Fasting blood glucose: 173 mg/dl COMPARISON:  04/18/2018 FINDINGS: Mediastinal blood pool activity: SUV max 2.69 Liver activity: SUV max 3.47 NECK: No hypermetabolic lymph nodes in the neck. Incidental CT findings: none CHEST: No hypermetabolic mediastinal or hilar nodes. No suspicious pulmonary nodules on the CT scan. Incidental CT findings: Lad coronary artery calcification noted. ABDOMEN/PELVIS: No abnormal hypermetabolic activity within the liver, pancreas, adrenal glands, or spleen. No hypermetabolic lymph nodes in the abdomen or pelvis. Within the ventral aspect of the pelvis there is a soft tissue nodule with surrounding fat stranding measuring 2.7 by 2.1 cm within SUV max of 5.61, image 173/4. Deauville criteria 4. Previously this measured 2.8 x 2.6 cm within SUV max of 3.8. Incidental CT findings: The appendix is again noted and appears diffusely thickened measuring 1.3 cm in maximum thickness. Gas noted within the lumen of the appendix suggesting patency. SKELETON: No focal hypermetabolic activity to suggest skeletal metastasis.There is diffusely increased radiotracer activity throughout the axial and appendicular skeleton which is favored to represent treatment related changes  Incidental CT findings: None IMPRESSION: 1. Soft tissue lesion within the ventral pelvis is again identified demonstrating mild to moderate increased uptake within SUV max of 5.61. Deauville criteria 4. Concerning for residual metabolically active tumor. 2. Similar appearance of diffusely thickened appendix  within the right lower quadrant of the abdomen. Findings may reflect treatment related changes. 3. Increased radiotracer uptake throughout the bone marrow which is favored to represent treatment related changes. 4. Lad coronary artery atherosclerotic calcifications. Electronically Signed   By: Kerby Moors M.D.   On: 06/09/2018 13:06   Dg Fluoro Guided Loc Of Needle/cath Tip For Spinal Inject Rt  Result Date: 06/13/2018 CLINICAL DATA:  Lymphoma. EXAM: FLUOROSCOPICALLY GUIDED LUMBAR PUNCTURE FOR INTRATHECAL CHEMOTHERAPY FLUOROSCOPY TIME:  0 minutes and 45 seconds. PROCEDURE: Informed consent was obtained from the patient prior to the procedure, including potential complications of headache, allergy, and pain. With the patient prone, the lower back was prepped with Betadine. 1% Lidocaine was used for local anesthesia. Lumbar puncture was performed at the L3-4 level using a 20 gauge needle with return of clearCSF. 10 cc of methotrexate (12 mg) and hydrocortisone (50 mg) was injected into the subarachnoid space. The patient tolerated the procedure well without apparent complication. IMPRESSION: Fluoroscopic guided lumbar puncture and subsequent intrathecal injection of chemotherapy. Electronically Signed   By: Marijo Sanes M.D.   On: 06/13/2018 12:32    Discharge Laboratory Values: . CBC Latest Ref Rng & Units 07/07/2018 07/06/2018 07/05/2018  WBC 4.0 - 10.5 K/uL 3.3(L) 4.7 5.1  Hemoglobin 13.0 - 17.0 g/dL 8.2(L) 8.4(L) 8.7(L)  Hematocrit 39.0 - 52.0 % 26.5(L) 26.9(L) 28.8(L)  Platelets 150 - 400 K/uL 482(H) 510(H) 535(H)   . CMP Latest Ref Rng & Units 07/07/2018 07/06/2018 07/05/2018  Glucose 70 - 99 mg/dL 186(H) 207(H) 244(H)  BUN 8 - 23 mg/dL 20 20 15   Creatinine 0.61 - 1.24 mg/dL 0.68 0.69 0.75  Sodium 135 - 145 mmol/L 137 137 139  Potassium 3.5 - 5.1 mmol/L 3.6 3.9 4.2  Chloride 98 - 111 mmol/L 103 104 108  CO2 22 - 32 mmol/L 26 24 22   Calcium 8.9 - 10.3 mg/dL 9.2 9.3 9.2  Total Protein  6.5 - 8.1 g/dL - - -  Total Bilirubin 0.3 - 1.2 mg/dL - - -  Alkaline Phos 38 - 126 U/L - - -  AST 15 - 41 U/L - - -  ALT 0 - 44 U/L - - -    Brief H and P: For complete details please refer to admission H and P, but in brief,  Mark Frey is a wonderful 62 y.o. male who has been admitted today for C6 EPOCH-R treatment of his Burkitt's lymphoma. The pt reports that he is doing well overall.  He has tolerated treatment well thus far with no prohibitive toxicities though notes some cumulative fatigue.  Issues during hospitalization  1. 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 No BM or CNS involvement.   04/18/18 PET/CT revealed Continued improvement in right colonic wall thickening and surrounding bulky masses  consistent with treated lymphoma. No hypermetabolic activity to suggest residual tumor. There is some hypermetabolic activity associated with the sigmoid colon which demonstrates mild wall thickening, surrounding inflammation and underlying diverticulosis, suggesting mild diverticulitis. Suspected treatment related changes throughout the bone marrow. There is mildly increased activity within the clivus without clear corresponding finding on the CT images. Attention on follow-up recommended. Nonobstructing right renal calculi. 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   06/09/18 PET/CT revealedSoft tissue lesion within the ventral pelvis is again identified  demonstrating mild to moderate increased uptake within SUV max of 5.61. Deauville criteria 4. Concerning for residual metabolically active tumor. 2. Similar appearance of diffusely thickened appendix within the right lower quadrant of the abdomen. Findings may reflect treatment related changes. 3. Increased radiotracer uptake throughout the bone marrow which is favored to represent treatment related changes. 4. Lad coronary artery atherosclerotic calcifications.   2. RLE DVT and b/l PE  3.s/pGI bleeding from lymphoma involving bowels-- resolved at this time -- will need to monitor with anticoagulation.  PLAN:  -Discussed pt labwork today, 07/07/18; blood counts and chemistries are stable  -The pt has no prohibitive toxicities from continuing C6D5 EPOCH-R at this time.   -Continue eating well, staying hydrated, and staying as active as reasonably possible  -Continue Lasix for 5-10 to address pedal edema -Continue PO 10m Eliquis BID for his VTE -outpatient Rituxan and neulasta on 07/10/2018 -outpatient PET/CT as scheduled on 07/28/2018 -counseled about treatment related anemia - currently asymptomatic- anticipate that it will improve of chemotherapy - patient to call if increasing fatigue - will consider PRBC transfusion if hgb<8 or if patient symptomatic.  4. DM2 with some increased hyperglycemia with steroids Plan -monitor  -continue Oral hypoglycemics and carb consistent diet at this time   5. DVT prophylaxis - patient on therapeutic Eliquis, ambulation, SCD  6. Chemotherapy related fatigue - maintain good po hydration and food intake and keeping physically active.   Physical Exam at Discharge: BP (!) 131/94 (BP Location: Right Arm)   Pulse 85   Temp 98.3 F (36.8 C) (Oral)   Resp 20   Wt 222 lb 8 oz (100.9 kg)   SpO2 99%   BMI 32.86 kg/m  . GENERAL:alert, in no acute distress and comfortable SKIN: no acute rashes, no significant lesions EYES: conjunctiva are pink  and non-injected, sclera anicteric OROPHARYNX: MMM, no exudates, no oropharyngeal erythema or ulceration NECK: supple, no JVD LYMPH:  no palpable lymphadenopathy in the cervical, axillary or inguinal regions LUNGS: clear to auscultation b/l with normal respiratory effort HEART: regular rate & rhythm ABDOMEN:  normoactive bowel sounds , non tender, not distended. Extremity: 2+ rt and 1+ left pedal edema PSYCH: alert & oriented x 3 with fluent speech NEURO: no focal motor/sensory deficits  Hospital Course:  Active Problems:   Burkitt lymphoma, lymph nodes of multiple sites ([facility]   Diet:  Consistent carbohydrate cardiac healthy diet  Activity:  Infection prevention strategies  Condition at Discharge:   Stable  Signed: Dr. GSullivan LoneMD [phone] 07/07/2018, 9:54 AM  TT spent discharging patient >30 mins

## 2018-07-10 ENCOUNTER — Inpatient Hospital Stay: Payer: BLUE CROSS/BLUE SHIELD

## 2018-07-10 ENCOUNTER — Other Ambulatory Visit: Payer: Self-pay | Admitting: Hematology

## 2018-07-10 ENCOUNTER — Telehealth: Payer: Self-pay

## 2018-07-10 VITALS — BP 110/70 | HR 88 | Temp 98.0°F | Resp 17 | Wt 229.8 lb

## 2018-07-10 DIAGNOSIS — Z7189 Other specified counseling: Secondary | ICD-10-CM

## 2018-07-10 DIAGNOSIS — C8378 Burkitt lymphoma, lymph nodes of multiple sites: Secondary | ICD-10-CM | POA: Diagnosis not present

## 2018-07-10 DIAGNOSIS — I82411 Acute embolism and thrombosis of right femoral vein: Secondary | ICD-10-CM | POA: Diagnosis not present

## 2018-07-10 DIAGNOSIS — N200 Calculus of kidney: Secondary | ICD-10-CM | POA: Diagnosis not present

## 2018-07-10 DIAGNOSIS — Z95828 Presence of other vascular implants and grafts: Secondary | ICD-10-CM

## 2018-07-10 DIAGNOSIS — E785 Hyperlipidemia, unspecified: Secondary | ICD-10-CM | POA: Diagnosis not present

## 2018-07-10 DIAGNOSIS — I2699 Other pulmonary embolism without acute cor pulmonale: Secondary | ICD-10-CM | POA: Diagnosis not present

## 2018-07-10 DIAGNOSIS — C8510 Unspecified B-cell lymphoma, unspecified site: Secondary | ICD-10-CM

## 2018-07-10 DIAGNOSIS — I2510 Atherosclerotic heart disease of native coronary artery without angina pectoris: Secondary | ICD-10-CM | POA: Diagnosis not present

## 2018-07-10 DIAGNOSIS — K5730 Diverticulosis of large intestine without perforation or abscess without bleeding: Secondary | ICD-10-CM | POA: Diagnosis not present

## 2018-07-10 DIAGNOSIS — Z7901 Long term (current) use of anticoagulants: Secondary | ICD-10-CM | POA: Diagnosis not present

## 2018-07-10 DIAGNOSIS — Z79899 Other long term (current) drug therapy: Secondary | ICD-10-CM | POA: Diagnosis not present

## 2018-07-10 DIAGNOSIS — Z5111 Encounter for antineoplastic chemotherapy: Secondary | ICD-10-CM | POA: Diagnosis not present

## 2018-07-10 DIAGNOSIS — E1165 Type 2 diabetes mellitus with hyperglycemia: Secondary | ICD-10-CM | POA: Diagnosis not present

## 2018-07-10 MED ORDER — DIPHENHYDRAMINE HCL 25 MG PO CAPS
ORAL_CAPSULE | ORAL | Status: AC
  Filled 2018-07-10: qty 2

## 2018-07-10 MED ORDER — ACETAMINOPHEN 325 MG PO TABS
650.0000 mg | ORAL_TABLET | Freq: Once | ORAL | Status: AC
  Administered 2018-07-10: 650 mg via ORAL

## 2018-07-10 MED ORDER — SODIUM CHLORIDE 0.9% FLUSH
10.0000 mL | INTRAVENOUS | Status: DC | PRN
  Administered 2018-07-10: 10 mL
  Filled 2018-07-10: qty 10

## 2018-07-10 MED ORDER — PEGFILGRASTIM INJECTION 6 MG/0.6ML ~~LOC~~
6.0000 mg | PREFILLED_SYRINGE | Freq: Once | SUBCUTANEOUS | Status: AC
  Administered 2018-07-10: 6 mg via SUBCUTANEOUS

## 2018-07-10 MED ORDER — METHYLPREDNISOLONE SODIUM SUCC 125 MG IJ SOLR
80.0000 mg | Freq: Once | INTRAMUSCULAR | Status: AC
  Administered 2018-07-10: 80 mg via INTRAVENOUS

## 2018-07-10 MED ORDER — DIPHENHYDRAMINE HCL 25 MG PO CAPS
50.0000 mg | ORAL_CAPSULE | Freq: Once | ORAL | Status: AC
  Administered 2018-07-10: 50 mg via ORAL

## 2018-07-10 MED ORDER — HEPARIN SOD (PORK) LOCK FLUSH 100 UNIT/ML IV SOLN
500.0000 [IU] | Freq: Once | INTRAVENOUS | Status: AC | PRN
  Administered 2018-07-10: 500 [IU]
  Filled 2018-07-10: qty 5

## 2018-07-10 MED ORDER — SODIUM CHLORIDE 0.9 % IV SOLN
Freq: Once | INTRAVENOUS | Status: AC
  Administered 2018-07-10: 10:00:00 via INTRAVENOUS
  Filled 2018-07-10: qty 250

## 2018-07-10 MED ORDER — METHYLPREDNISOLONE SODIUM SUCC 125 MG IJ SOLR
INTRAMUSCULAR | Status: AC
  Filled 2018-07-10: qty 2

## 2018-07-10 MED ORDER — SODIUM CHLORIDE 0.9 % IV SOLN
375.0000 mg/m2 | Freq: Once | INTRAVENOUS | Status: AC
  Administered 2018-07-10: 900 mg via INTRAVENOUS
  Filled 2018-07-10: qty 50

## 2018-07-10 MED ORDER — ACETAMINOPHEN 325 MG PO TABS
ORAL_TABLET | ORAL | Status: AC
  Filled 2018-07-10: qty 2

## 2018-07-10 NOTE — Telephone Encounter (Signed)
Per 10/28 patient request for infusion for today to be cancel and r/s for 110/29 not needed. Patient did receive treatment and labs today. Per 10/28 patient request and charge RN approved

## 2018-07-10 NOTE — Patient Instructions (Signed)
[facility] Discharge Instructions for Patients Receiving Chemotherapy  Today you received the following chemotherapy agents Rituxan.  To help prevent nausea and vomiting after your treatment, we encourage you to take your nausea medication as directed.  If you develop nausea and vomiting that is not controlled by your nausea medication, call the clinic.   BELOW ARE SYMPTOMS THAT SHOULD BE REPORTED IMMEDIATELY:  *FEVER GREATER THAN 100.5 F  *CHILLS WITH OR WITHOUT FEVER  NAUSEA AND VOMITING THAT IS NOT CONTROLLED WITH YOUR NAUSEA MEDICATION  *UNUSUAL SHORTNESS OF BREATH  *UNUSUAL BRUISING OR BLEEDING  TENDERNESS IN MOUTH AND THROAT WITH OR WITHOUT PRESENCE OF ULCERS  *URINARY PROBLEMS  *BOWEL PROBLEMS  UNUSUAL RASH Items with * indicate a potential emergency and should be followed up as soon as possible.  Feel free to call the clinic should you have any questions or concerns. The clinic phone number is [phone].  Please show the [facility] at check-in to the Emergency Department and triage nurse.

## 2018-07-10 NOTE — Progress Notes (Signed)
Per [name] it is ok to treat today using labs from Friday 07/07/18.

## 2018-07-11 ENCOUNTER — Ambulatory Visit: Payer: BLUE CROSS/BLUE SHIELD

## 2018-07-17 ENCOUNTER — Other Ambulatory Visit: Payer: Self-pay | Admitting: Emergency Medicine

## 2018-07-17 ENCOUNTER — Telehealth: Payer: Self-pay | Admitting: *Deleted

## 2018-07-17 ENCOUNTER — Inpatient Hospital Stay: Payer: BLUE CROSS/BLUE SHIELD

## 2018-07-17 ENCOUNTER — Inpatient Hospital Stay: Payer: BLUE CROSS/BLUE SHIELD | Attending: Medical | Admitting: Medical

## 2018-07-17 ENCOUNTER — Ambulatory Visit (HOSPITAL_COMMUNITY)
Admission: RE | Admit: 2018-07-17 | Discharge: 2018-07-17 | Disposition: A | Discharge status: Home / Self Care | Payer: BLUE CROSS/BLUE SHIELD | Source: Ambulatory Visit | Attending: Medical | Admitting: Medical

## 2018-07-17 VITALS — BP 122/95 | HR 126 | Temp 99.1°F | Resp 19 | Ht 69.0 in | Wt 215.2 lb

## 2018-07-17 DIAGNOSIS — Z86711 Personal history of pulmonary embolism: Secondary | ICD-10-CM | POA: Diagnosis not present

## 2018-07-17 DIAGNOSIS — Z79899 Other long term (current) drug therapy: Secondary | ICD-10-CM | POA: Diagnosis not present

## 2018-07-17 DIAGNOSIS — K56699 Other intestinal obstruction unspecified as to partial versus complete obstruction: Secondary | ICD-10-CM | POA: Diagnosis not present

## 2018-07-17 DIAGNOSIS — C8510 Unspecified B-cell lymphoma, unspecified site: Secondary | ICD-10-CM

## 2018-07-17 DIAGNOSIS — E1169 Type 2 diabetes mellitus with other specified complication: Secondary | ICD-10-CM | POA: Diagnosis not present

## 2018-07-17 DIAGNOSIS — R1032 Left lower quadrant pain: Secondary | ICD-10-CM

## 2018-07-17 DIAGNOSIS — Z86718 Personal history of other venous thrombosis and embolism: Secondary | ICD-10-CM | POA: Diagnosis not present

## 2018-07-17 DIAGNOSIS — I2699 Other pulmonary embolism without acute cor pulmonale: Secondary | ICD-10-CM | POA: Diagnosis not present

## 2018-07-17 DIAGNOSIS — C8378 Burkitt lymphoma, lymph nodes of multiple sites: Secondary | ICD-10-CM | POA: Diagnosis not present

## 2018-07-17 DIAGNOSIS — N289 Disorder of kidney and ureter, unspecified: Secondary | ICD-10-CM | POA: Diagnosis not present

## 2018-07-17 DIAGNOSIS — E876 Hypokalemia: Secondary | ICD-10-CM | POA: Diagnosis not present

## 2018-07-17 DIAGNOSIS — D6481 Anemia due to antineoplastic chemotherapy: Secondary | ICD-10-CM | POA: Diagnosis not present

## 2018-07-17 DIAGNOSIS — K56609 Unspecified intestinal obstruction, unspecified as to partial versus complete obstruction: Secondary | ICD-10-CM | POA: Diagnosis not present

## 2018-07-17 DIAGNOSIS — R000 Tachycardia, unspecified: Secondary | ICD-10-CM | POA: Diagnosis not present

## 2018-07-17 DIAGNOSIS — Z95828 Presence of other vascular implants and grafts: Secondary | ICD-10-CM

## 2018-07-17 DIAGNOSIS — N200 Calculus of kidney: Secondary | ICD-10-CM | POA: Diagnosis not present

## 2018-07-17 DIAGNOSIS — K5669 Other partial intestinal obstruction: Secondary | ICD-10-CM

## 2018-07-17 DIAGNOSIS — C8370 Burkitt lymphoma, unspecified site: Secondary | ICD-10-CM | POA: Diagnosis not present

## 2018-07-17 DIAGNOSIS — Z0189 Encounter for other specified special examinations: Secondary | ICD-10-CM | POA: Diagnosis not present

## 2018-07-17 DIAGNOSIS — Z4682 Encounter for fitting and adjustment of non-vascular catheter: Secondary | ICD-10-CM | POA: Diagnosis not present

## 2018-07-17 DIAGNOSIS — Z888 Allergy status to other drugs, medicaments and biological substances status: Secondary | ICD-10-CM | POA: Diagnosis not present

## 2018-07-17 DIAGNOSIS — D696 Thrombocytopenia, unspecified: Secondary | ICD-10-CM | POA: Diagnosis not present

## 2018-07-17 DIAGNOSIS — E119 Type 2 diabetes mellitus without complications: Secondary | ICD-10-CM | POA: Diagnosis not present

## 2018-07-17 DIAGNOSIS — E785 Hyperlipidemia, unspecified: Secondary | ICD-10-CM | POA: Diagnosis not present

## 2018-07-17 DIAGNOSIS — R112 Nausea with vomiting, unspecified: Secondary | ICD-10-CM | POA: Diagnosis not present

## 2018-07-17 DIAGNOSIS — Z7901 Long term (current) use of anticoagulants: Secondary | ICD-10-CM | POA: Diagnosis not present

## 2018-07-17 DIAGNOSIS — T451X5A Adverse effect of antineoplastic and immunosuppressive drugs, initial encounter: Secondary | ICD-10-CM | POA: Diagnosis not present

## 2018-07-17 DIAGNOSIS — Z794 Long term (current) use of insulin: Secondary | ICD-10-CM | POA: Diagnosis not present

## 2018-07-17 DIAGNOSIS — Z9221 Personal history of antineoplastic chemotherapy: Secondary | ICD-10-CM | POA: Diagnosis not present

## 2018-07-17 DIAGNOSIS — K56600 Partial intestinal obstruction, unspecified as to cause: Secondary | ICD-10-CM | POA: Diagnosis not present

## 2018-07-17 LAB — CBC WITH DIFFERENTIAL (CANCER CENTER ONLY)
Abs Immature Granulocytes: 1.16 10*3/uL — ABNORMAL HIGH (ref 0.00–0.07)
Basophils Absolute: 0.1 10*3/uL (ref 0.0–0.1)
Basophils Relative: 1 %
EOS PCT: 0 %
Eosinophils Absolute: 0 10*3/uL (ref 0.0–0.5)
HEMATOCRIT: 29.1 % — AB (ref 39.0–52.0)
Hemoglobin: 9 g/dL — ABNORMAL LOW (ref 13.0–17.0)
Immature Granulocytes: 10 %
Lymphocytes Relative: 5 %
Lymphs Abs: 0.5 10*3/uL — ABNORMAL LOW (ref 0.7–4.0)
MCH: 30.5 pg (ref 26.0–34.0)
MCHC: 30.9 g/dL (ref 30.0–36.0)
MCV: 98.6 fL (ref 80.0–100.0)
MONO ABS: 1.8 10*3/uL — AB (ref 0.1–1.0)
MONOS PCT: 16 %
Neutro Abs: 7.9 10*3/uL — ABNORMAL HIGH (ref 1.7–7.7)
Neutrophils Relative %: 68 %
Platelet Count: 200 10*3/uL (ref 150–400)
RBC: 2.95 MIL/uL — ABNORMAL LOW (ref 4.22–5.81)
RDW: 18.1 % — AB (ref 11.5–15.5)
WBC Count: 11.5 10*3/uL — ABNORMAL HIGH (ref 4.0–10.5)
nRBC: 0.2 % (ref 0.0–0.2)

## 2018-07-17 LAB — CMP (CANCER CENTER ONLY)
ALK PHOS: 55 U/L (ref 38–126)
ALT: 15 U/L (ref 0–44)
AST: 13 U/L — AB (ref 15–41)
Albumin: 3.3 g/dL — ABNORMAL LOW (ref 3.5–5.0)
Anion gap: 11 (ref 5–15)
BILIRUBIN TOTAL: 0.3 mg/dL (ref 0.3–1.2)
BUN: 10 mg/dL (ref 8–23)
CALCIUM: 10 mg/dL (ref 8.9–10.3)
CO2: 24 mmol/L (ref 22–32)
CREATININE: 0.89 mg/dL (ref 0.61–1.24)
Chloride: 103 mmol/L (ref 98–111)
GFR, Est AFR Am: >60 mL/min (ref >60)
GFR, Estimated: >60 mL/min (ref >60)
GLUCOSE: 146 mg/dL — AB (ref 70–99)
POTASSIUM: 4 mmol/L (ref 3.5–5.1)
Sodium: 138 mmol/L (ref 135–145)
TOTAL PROTEIN: 6.7 g/dL (ref 6.5–8.1)

## 2018-07-17 MED ORDER — MORPHINE SULFATE (PF) 4 MG/ML IV SOLN
INTRAVENOUS | Status: AC
  Filled 2018-07-17: qty 1

## 2018-07-17 MED ORDER — HYDROCODONE-ACETAMINOPHEN 5-325 MG PO TABS: 1.0000 | ORAL_TABLET | ORAL | 0 refills | Status: DC | PRN

## 2018-07-17 MED ORDER — HEPARIN SOD (PORK) LOCK FLUSH 100 UNIT/ML IV SOLN
500.0000 [IU] | Freq: Once | INTRAVENOUS | Status: AC
  Administered 2018-07-17: 500 [IU]
  Filled 2018-07-17: qty 5

## 2018-07-17 MED ORDER — MORPHINE SULFATE (PF) 4 MG/ML IV SOLN
2.0000 mg | Freq: Once | INTRAVENOUS | Status: AC
  Administered 2018-07-17: 2 mg via INTRAVENOUS

## 2018-07-17 MED ORDER — SODIUM CHLORIDE 0.9 % IV SOLN
Freq: Once | INTRAVENOUS | Status: AC
  Administered 2018-07-17: 14:00:00 via INTRAVENOUS
  Filled 2018-07-17: qty 250

## 2018-07-17 MED ORDER — METRONIDAZOLE 500 MG PO TABS: 500.0000 mg | ORAL_TABLET | Freq: Three times a day (TID) | ORAL | 0 refills | Status: DC

## 2018-07-17 MED ORDER — SODIUM CHLORIDE 0.9% FLUSH
10.0000 mL | Freq: Once | INTRAVENOUS | Status: AC
  Administered 2018-07-17: 10 mL
  Filled 2018-07-17: qty 10

## 2018-07-17 NOTE — Patient Instructions (Signed)
Dehydration, Adult Dehydration is when there is not enough fluid or water in your body. This happens when you lose more fluids than you take in. Dehydration can range from mild to very bad. It should be treated right away to keep it from getting very bad. Symptoms of mild dehydration may include:  Thirst.  Dry lips.  Slightly dry mouth.  Dry, warm skin.  Dizziness. Symptoms of moderate dehydration may include:  Very dry mouth.  Muscle cramps.  Dark pee (urine). Pee may be the color of tea.  Your body making less pee.  Your eyes making fewer tears.  Heartbeat that is uneven or faster than normal (palpitations).  Headache.  Light-headedness, especially when you stand up from sitting.  Fainting (syncope). Symptoms of very bad dehydration may include:  Changes in skin, such as: ? Cold and clammy skin. ? Blotchy (mottled) or pale skin. ? Skin that does not quickly return to normal after being lightly pinched and let go (poor skin turgor).  Changes in body fluids, such as: ? Feeling very thirsty. ? Your eyes making fewer tears. ? Not sweating when body temperature is high, such as in hot weather. ? Your body making very little pee.  Changes in vital signs, such as: ? Weak pulse. ? Pulse that is more than 100 beats a minute when you are sitting still. ? Fast breathing. ? Low blood pressure.  Other changes, such as: ? Sunken eyes. ? Cold hands and feet. ? Confusion. ? Lack of energy (lethargy). ? Trouble waking up from sleep. ? Short-term weight loss. ? Unconsciousness. Follow these instructions at home:  If told by your doctor, drink an ORS: ? Make an ORS by using instructions on the package. ? Start by drinking small amounts, about  cup (120 mL) every 5-10 minutes. ? Slowly drink more until you have had the amount that your doctor said to have.  Drink enough clear fluid to keep your pee clear or pale yellow. If you were told to drink an ORS, finish the ORS  first, then start slowly drinking clear fluids. Drink fluids such as: ? Water. Do not drink only water by itself. Doing that can make the salt (sodium) level in your body get too low (hyponatremia). ? Ice chips. ? Fruit juice that you have added water to (diluted). ? Low-calorie sports drinks.  Avoid: ? Alcohol. ? Drinks that have a lot of sugar. These include high-calorie sports drinks, fruit juice that does not have water added, and soda. ? Caffeine. ? Foods that are greasy or have a lot of fat or sugar.  Take over-the-counter and prescription medicines only as told by your doctor.  Do not take salt tablets. Doing that can make the salt level in your body get too high (hypernatremia).  Eat foods that have minerals (electrolytes). Examples include bananas, oranges, potatoes, tomatoes, and spinach.  Keep all follow-up visits as told by your doctor. This is important. Contact a doctor if:  You have belly (abdominal) pain that: ? Gets worse. ? Stays in one area (localizes).  You have a rash.  You have a stiff neck.  You get angry or annoyed more easily than normal (irritability).  You are more sleepy than normal.  You have a harder time waking up than normal.  You feel: ? Weak. ? Dizzy. ? Very thirsty.  You have peed (urinated) only a small amount of very dark pee during 6-8 hours. Get help right away if:  You have symptoms of   very bad dehydration.  You cannot drink fluids without throwing up (vomiting).  Your symptoms get worse with treatment.  You have a fever.  You have a very bad headache.  You are throwing up or having watery poop (diarrhea) and it: ? Gets worse. ? Does not go away.  You have blood or something green (bile) in your throw-up.  You have blood in your poop (stool). This may cause poop to look black and tarry.  You have not peed in 6-8 hours.  You pass out (faint).  Your heart rate when you are sitting still is more than 100 beats a  minute.  You have trouble breathing. This information is not intended to replace advice given to you by your health care provider. Make sure you discuss any questions you have with your health care provider. Document Released: 06/26/2009 Document Revised: 03/19/2016 Document Reviewed: 10/24/2015 Elsevier Interactive Patient Education  2018 [employer] Inc[employer]  

## 2018-07-17 NOTE — Telephone Encounter (Signed)
Wife [name]) called office this AM. Asks if patient can be seen today. Stated patient has been feeling weak all weekend, Urine output is low, has abdominal pain, center to right sided. Per wife - On d/c from [facility] 10/25, hgb was low  [8.2] and [name] told them he might need to be seen if too low. Information given to [facility]. Per PA, Mr. Satira Sark, in [facility], patient will be scheduled to have lab work done today and be seen in [facility] today. Contacted wife with appointment times. Wife verbalized understanding and thanked caller.

## 2018-07-18 ENCOUNTER — Inpatient Hospital Stay: Payer: BLUE CROSS/BLUE SHIELD

## 2018-07-18 VITALS — BP 136/89 | HR 131 | Temp 98.2°F | Resp 17 | Ht 69.0 in | Wt 217.7 lb

## 2018-07-18 DIAGNOSIS — Z95828 Presence of other vascular implants and grafts: Secondary | ICD-10-CM

## 2018-07-18 DIAGNOSIS — Z7189 Other specified counseling: Secondary | ICD-10-CM

## 2018-07-18 DIAGNOSIS — C8510 Unspecified B-cell lymphoma, unspecified site: Secondary | ICD-10-CM

## 2018-07-18 MED ORDER — SODIUM CHLORIDE 0.9 % IV SOLN
Freq: Once | INTRAVENOUS | Status: AC
  Administered 2018-07-18: 10:00:00 via INTRAVENOUS
  Filled 2018-07-18: qty 250

## 2018-07-18 MED ORDER — HEPARIN SOD (PORK) LOCK FLUSH 100 UNIT/ML IV SOLN
500.0000 [IU] | Freq: Once | INTRAVENOUS | Status: AC
  Administered 2018-07-18: 500 [IU]
  Filled 2018-07-18: qty 5

## 2018-07-18 MED ORDER — SODIUM CHLORIDE 0.9% FLUSH
10.0000 mL | Freq: Once | INTRAVENOUS | Status: AC
  Administered 2018-07-18: 10 mL
  Filled 2018-07-18: qty 10

## 2018-07-18 NOTE — Patient Instructions (Signed)

## 2018-07-20 ENCOUNTER — Other Ambulatory Visit: Payer: Self-pay | Admitting: Medical

## 2018-07-20 ENCOUNTER — Ambulatory Visit (HOSPITAL_COMMUNITY)
Admission: RE | Admit: 2018-07-20 | Discharge: 2018-07-20 | Disposition: A | Discharge status: Home / Self Care | Payer: BLUE CROSS/BLUE SHIELD | Source: Ambulatory Visit | Attending: Medical | Admitting: Medical

## 2018-07-20 ENCOUNTER — Inpatient Hospital Stay (HOSPITAL_BASED_OUTPATIENT_CLINIC_OR_DEPARTMENT_OTHER): Payer: BLUE CROSS/BLUE SHIELD | Admitting: Medical

## 2018-07-20 ENCOUNTER — Telehealth: Payer: Self-pay | Admitting: Emergency Medicine

## 2018-07-20 VITALS — BP 145/102 | HR 125 | Temp 98.9°F | Resp 20 | Ht 69.0 in | Wt 217.7 lb

## 2018-07-20 DIAGNOSIS — R112 Nausea with vomiting, unspecified: Secondary | ICD-10-CM | POA: Diagnosis not present

## 2018-07-20 DIAGNOSIS — C8378 Burkitt lymphoma, lymph nodes of multiple sites: Secondary | ICD-10-CM | POA: Diagnosis not present

## 2018-07-20 DIAGNOSIS — K56600 Partial intestinal obstruction, unspecified as to cause: Secondary | ICD-10-CM | POA: Insufficient documentation

## 2018-07-20 DIAGNOSIS — C8510 Unspecified B-cell lymphoma, unspecified site: Secondary | ICD-10-CM

## 2018-07-20 DIAGNOSIS — R1032 Left lower quadrant pain: Secondary | ICD-10-CM

## 2018-07-20 DIAGNOSIS — K6389 Other specified diseases of intestine: Secondary | ICD-10-CM | POA: Insufficient documentation

## 2018-07-20 DIAGNOSIS — K5669 Other partial intestinal obstruction: Secondary | ICD-10-CM

## 2018-07-20 DIAGNOSIS — Z95828 Presence of other vascular implants and grafts: Secondary | ICD-10-CM

## 2018-07-20 DIAGNOSIS — Z7189 Other specified counseling: Secondary | ICD-10-CM

## 2018-07-20 DIAGNOSIS — K56609 Unspecified intestinal obstruction, unspecified as to partial versus complete obstruction: Secondary | ICD-10-CM | POA: Diagnosis not present

## 2018-07-20 MED ORDER — HEPARIN SOD (PORK) LOCK FLUSH 100 UNIT/ML IV SOLN
500.0000 [IU] | Freq: Once | INTRAVENOUS | Status: AC
  Administered 2018-07-20: 500 [IU]
  Filled 2018-07-20: qty 5

## 2018-07-20 MED ORDER — SODIUM CHLORIDE 0.9% FLUSH
10.0000 mL | Freq: Once | INTRAVENOUS | Status: AC
  Administered 2018-07-20: 10 mL
  Filled 2018-07-20: qty 10

## 2018-07-20 MED ORDER — SODIUM CHLORIDE 0.9 % IV SOLN
Freq: Once | INTRAVENOUS | Status: AC
  Administered 2018-07-20: 14:00:00 via INTRAVENOUS
  Filled 2018-07-20: qty 250

## 2018-07-20 NOTE — Patient Instructions (Signed)

## 2018-07-20 NOTE — Telephone Encounter (Signed)
Returning VM from pt's wife stating that pt is feeling much worse today and that they want to be seen.  Pt to have abd scan then come to [facility] for evaluation/scan results.  Verbalized understanding.

## 2018-07-20 NOTE — Progress Notes (Signed)
Pt provided with oral contrast and education for abd CT tomorrow morning followed by [facility] appt.  Verbalized understanding of instructions.

## 2018-07-20 NOTE — Progress Notes (Signed)
[facility] Progress Note   Mark Frey [id number] 02-20-1956 62 y.o.  Mark Frey is managed by Mark Frey  Actively treated with chemotherapy/immunotherapy: yes  Current Therapy: EPOCH-R  Last Treated: 07/03/2018 (cycle 6)  Assessment: Plan:    Left lower quadrant abdominal pain - Plan: 0.9 %  sodium chloride infusion, CT Abdomen W Contrast  Non-intractable vomiting with nausea, unspecified vomiting type - Plan: CT Abdomen W Contrast  Other partial intestinal obstruction (HCC)  Burkitt lymphoma, lymph nodes of multiple sites ([place])  Abdominal plain and bloating, nausea, and partial bowel obstruction on abdominal x-ray: This case was discussed with Mark Frey.  Based on her recommendations, a CT scan of the abdomen has been ordered for evaluation of a possible impending blockage.  The patient has been offered the option of presenting to the emergency room this afternoon for CT scans and management versus having the scans completed tomorrow in outpatient setting.  The patient elects to schedule scans for tomorrow morning.  A KUB from earlier today shows:  1. Interval partial small bowel obstruction. 2. Interval nodular mucosal pattern involving the transverse, distal descending and proximal sigmoid colon. This could be due to infectious or inflammatory colitis.  He continues on Flagyl.  He is also taking Gas-X and has been taking Norco as needed for pain.  Burkitt's lymphoma of multiple sites with bulky abdominal lesions: Mark Frey is status post cycle 6 of EPOCH-R which was last given on 07/03/2018 under the direction of Mark Frey.  Please see After Visit Summary for patient specific instructions.  Future Appointments  Date Time Provider [place]  07/28/2018  9:00 AM [facility] PET CT 1 [facility] [place]  08/01/2018 10:00 AM [facility] LAB 6 [facility] None  08/01/2018 10:40 AM Mark Frey [facility] None  08/01/2018 12:00  PM [facility] INFUSION [facility] None    Orders Placed This Encounter  Procedures  . CT Abdomen W Contrast       Subjective:   Patient ID:  Mark Frey is a 62 y.o. (DOB [birth year]) male.  Chief Complaint: No chief complaint on file.   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  He had a CT scan completed on 03/10/2018 which showed a large irregular infiltrative solid mass in the right lower quadrant measuring approximately 18.7 x 18.5 x 17.8 cm.  This mass was infiltrating and encasing multiple distal small bowel loops and likely the ileocecal region.  It was partially encasing the sigmoid colon with prominent extension into the right lower retroperitoneum and extraperitoneal right pelvis with encasement of the right external iliac and proximal right common iliac vasculature with infiltration of the right iliopsoas muscle.   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  He was originally seen by Mark Frey but then transition care to Mark Frey given his new diagnosis.  He was most recently treated with cycle 6 of EPOCH-R chemotherapy on 07/03/2018.  He presents to the clinic today having last been seen on 07/18/2016 for IV fluids.  He was initially feeling better but is now had a recurrence of abdominal distention, burping, intermittent minimal diarrhea and left lower quadrant pain.  He reports having one semi-formed stool this morning.  He has also had flatulence and reports passing  flatulence this morning while having his abdominal x-ray completed  He was found to be tachycardic at 145 when he arrived today.  He and his wife were given the option of proceeding to the emergency room for immediate evaluation or having a CT scan completed in the  morning.  They have opted to have a CT scan completed in the morning but have agreed to present to the emergency room should his symptoms worsen tonight.  Medications: I have reviewed the patient's current medications.  Allergies:  Allergies  Allergen Reactions  . Ciprofloxacin Other (See Comments)    Leg tingling    Past Medical History:  Diagnosis Date  . ALLERGIC RHINITIS   . Diabetes mellitus   . Hyperlipidemia     Past Surgical History:  Procedure Laterality Date  . BIOPSY  03/15/2018   Procedure: BIOPSY;  Surgeon: Milus Banister, Frey;  Location: [facility];  Service: Endoscopy;;  . CLEFT PALATE REPAIR    . COLONOSCOPY N/A 03/15/2018   Procedure: COLONOSCOPY;  Surgeon: Milus Banister, Frey;  Location: [facility];  Service: Endoscopy;  Laterality: N/A;  . deviated septum repair     slight improvement  . ESOPHAGOGASTRODUODENOSCOPY N/A 03/15/2018   Procedure: ESOPHAGOGASTRODUODENOSCOPY (EGD);  Surgeon: Milus Banister, Frey;  Location: [name];  Service: Endoscopy;  Laterality: N/A;  . IR IMAGING GUIDED PORT INSERTION  03/17/2018  . TONSILLECTOMY      Family History  Problem Relation Age of Onset  . Lung cancer Mother        smoker  . Brain cancer Mother        metastasis  . AAA (abdominal aortic aneurysm) Father        smoker    Social History   Socioeconomic History  . Marital status: Married    Spouse name: lisa  . Number of children: 0  . Years of education: Not on file  . Highest education level: Not on file  Occupational History  . Not on file  Social Needs  . Financial resource strain: Not hard at all  . Food insecurity:    Worry: Never true    Inability: Never true  . Transportation needs:    Medical: No    Non-medical: No  Tobacco Use  . Smoking status: Never Smoker  . Smokeless tobacco: Never Used  Substance and Sexual Activity  . Alcohol use: Yes    Comment: occasional  . Drug use: No  . Sexual activity: Yes  Lifestyle  . Physical  activity:    Days per week: 0 days    Minutes per session: 0 min  . Stress: Not at all  Relationships  . Social connections:    Talks on phone: More than three times a week    Gets together: More than three times a week    Attends religious service: 1 to 4 times per year    Active member of club or organization: No    Attends meetings of clubs or organizations: Never    Relationship status: Married  . Intimate partner violence:    Fear of current or ex partner: No    Emotionally abused: No    Physically abused: No    Forced sexual activity: No  Other Topics Concern  . Not on file  Social History Narrative   Married 1985. No kids. 4 small dogs.       Works in Financial trader, residential      Hobbies: work on cars, Haematologist, exercise  as able    Past Medical History, Surgical history, Social history, and Family history were reviewed and updated as appropriate.   Please see review of systems for further details on the patient's review from today.   Review of Systems:  Review of Systems  Constitutional: Positive for activity change. Negative for appetite change, chills, diaphoresis, fatigue and fever.  HENT: Negative for trouble swallowing.   Respiratory: Negative for cough, chest tightness and shortness of breath.   Cardiovascular: Negative for chest pain, palpitations and leg swelling.  Gastrointestinal: Positive for abdominal distention, abdominal pain and diarrhea. Negative for constipation, nausea and vomiting.       Burping Flatulence 1 small formed stool this morning.  Genitourinary: Negative for difficulty urinating.    Objective:   Physical Exam:  BP (!) 145/102 (BP Location: Left Arm, Patient Position: Sitting) Comment: Notified Nurse of Bp  Pulse (!) 145 Comment: Notified Nurse of Pulse Rate  Temp 98.9 F (37.2 C) (Oral)   Resp 20   Ht 5' 9"  (1.753 m)   Wt 217 lb 11.2 oz (98.7 kg)   SpO2 99%   BMI 32.15 kg/m  ECOG: 1  Physical Exam    Constitutional: No distress.  HENT:  Head: Normocephalic and atraumatic.  Mouth/Throat: Oropharynx is clear and moist.  Cardiovascular: Regular rhythm and normal heart sounds. Tachycardia present. Exam reveals no gallop and no friction rub.  No murmur heard. Pulmonary/Chest: Effort normal and breath sounds normal. No respiratory distress. He has no wheezes. He has no rales.  Abdominal: Soft. He exhibits no distension and no mass. Bowel sounds are decreased. There is no rebound and no guarding.  Musculoskeletal: He exhibits no edema.  Neurological: He is alert.  Skin: Skin is warm and dry. He is not diaphoretic.    Lab Review:     Component Value Date/Time   NA 138 07/17/2018 1150   K 4.0 07/17/2018 1150   CL 103 07/17/2018 1150   CO2 24 07/17/2018 1150   GLUCOSE 146 (H) 07/17/2018 1150   BUN 10 07/17/2018 1150   CREATININE 0.89 07/17/2018 1150   CREATININE 1.26 12/01/2012 1700   CALCIUM 10.0 07/17/2018 1150   PROT 6.7 07/17/2018 1150   ALBUMIN 3.3 (L) 07/17/2018 1150   AST 13 (L) 07/17/2018 1150   ALT 15 07/17/2018 1150   ALKPHOS 55 07/17/2018 1150   BILITOT 0.3 07/17/2018 1150   GFRNONAA >60 07/17/2018 1150   GFRAA >60 07/17/2018 1150       Component Value Date/Time   WBC 11.5 (H) 07/17/2018 1150   WBC 3.3 (L) 07/07/2018 0516   RBC 2.95 (L) 07/17/2018 1150   HGB 9.0 (L) 07/17/2018 1150   HCT 29.1 (L) 07/17/2018 1150   PLT 200 07/17/2018 1150   MCV 98.6 07/17/2018 1150   MCH 30.5 07/17/2018 1150   MCHC 30.9 07/17/2018 1150   RDW 18.1 (H) 07/17/2018 1150   LYMPHSABS 0.5 (L) 07/17/2018 1150   MONOABS 1.8 (H) 07/17/2018 1150   EOSABS 0.0 07/17/2018 1150   BASOSABS 0.1 07/17/2018 1150   -------------------------------  Imaging from last 24 hours (if applicable):  Radiology interpretation: Dg Abd 1 View  Result Date: 07/20/2018 CLINICAL DATA:  Abdominal distention and excess gas. EXAM: ABDOMEN - 1 VIEW COMPARISON:  07/17/2018. FINDINGS: Interval dilated small  bowel loops. There is also an interval nodular mucosal pattern involving the filled components of the transverse, distal descending and proximal sigmoid colon. No gross free peritoneal air. Lower thoracic and mild lumbar spine  degenerative changes. IMPRESSION: 1. Interval partial small bowel obstruction. 2. Interval nodular mucosal pattern involving the transverse, distal descending and proximal sigmoid colon. This could be due to infectious or inflammatory colitis. Electronically Signed   By: Claudie Revering M.D.   On: 07/20/2018 12:06   Dg Abd 1 View  Result Date: 07/17/2018 CLINICAL DATA:  Left lower quadrant abdominal pain. EXAM: ABDOMEN - 1 VIEW COMPARISON:  06/09/2018 FINDINGS: Moderate amount of intestinal gas which could be due to ileus or partial obstruction. Right renal stone again seen. No acute or significant bone finding. IMPRESSION: Moderate amount of intestinal gas within small and large bowel that could be due to ileus or partial obstruction. Relative paucity of gas projects over the pelvis. Right renal stone, chronic. Electronically Signed   By: Nelson Chimes M.D.   On: 07/17/2018 13:26        This case was discussed with Mark Frey. She expressed agreement with my management of this patient.

## 2018-07-20 NOTE — Progress Notes (Signed)
[facility] Progress Note   KILAN BANFILL [id number] July 16, 1956 62 y.o.  [name] is managed by Dr. Sullivan Lone  Actively treated with chemotherapy/immunotherapy: yes  Current Therapy: EPOCH-R  Last Treated: 07/03/2018 (cycle 6)  Assessment: Plan:    Left lower quadrant abdominal pain - Plan: DG Abd 1 View, 0.9 %  sodium chloride infusion, metroNIDAZOLE (FLAGYL) 500 MG tablet, morphine 4 MG/ML injection 2 mg, HYDROcodone-acetaminophen (NORCO) 5-325 MG tablet  Port-A-Cath in place - Plan: heparin lock flush 100 unit/mL, sodium chloride flush (NS) 0.9 % injection 10 mL, DISCONTINUED: heparin lock flush 100 unit/mL, DISCONTINUED: sodium chloride flush (NS) 0.9 % injection 10 mL  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HCC) - Plan: heparin lock flush 100 unit/mL, sodium chloride flush (NS) 0.9 % injection 10 mL, DISCONTINUED: heparin lock flush 100 unit/mL, DISCONTINUED: sodium chloride flush (NS) 0.9 % injection 10 mL   Left lower quadrant abdominal pain with bloating and burping: The patient was referred for a KUB which returned showing a partial small bowel obstruction versus an ileus.  This x-ray was reviewed with Dr. Julien Nordmann.  The patient was given IV fluids and was placed on Flagyl given the location of his left lower quadrant pain.  He was told to change his diet to a clear liquid diet.  He will return tomorrow for additional IV fluids.  He has been instructed to return sooner or present to the emergency room should he develop increasing pain, nausea, or vomiting.  High-grade B-cell lymphoma: The patient is status post cycle 6 of EPOCH-R which was dosed on 07/03/2018.  He is scheduled to return to see [name] on 08/01/2018.  Please see After Visit Summary for patient specific instructions.  Future Appointments  Date Time Provider [place]  07/20/2018  1:00 PM Harle Stanford., PA-C [facility] None  07/28/2018  9:00 AM [facility] PET CT 1 [facility] [place]    08/01/2018 10:00 AM [facility] LAB 6 [facility] None  08/01/2018 10:40 AM [name], MD [facility] None  08/01/2018 12:00 PM [facility] INFUSION [facility] None    Orders Placed This Encounter  Procedures  . DG Abd 1 View       Subjective:   Patient ID:  ESGAR BARNICK is a 62 y.o. (DOB June 21, 1956) male.  Chief Complaint:  Chief Complaint  Patient presents with  . 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  He had a CT scan completed on 03/10/2018 which showed a large irregular infiltrative solid mass in the right lower quadrant measuring approximately 18.7 x 18.5 x 17.8 cm.  This mass was infiltrating and encasing multiple distal small bowel loops and likely the ileocecal region.  It was partially encasing the sigmoid colon with prominent extension into the right lower retroperitoneum and extraperitoneal right pelvis with encasement of the right external iliac and proximal right common iliac vasculature with infiltration of the right iliopsoas muscle.   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  He was originally seen by Dr. Truitt Merle but then transition care to Dr. Sullivan Lone given his new diagnosis.  He was most recently treated with cycle 6 of EPOCH-R chemotherapy on 07/03/2018.  He presents to the clinic today with a report of abdominal distention, burping, and  intermittent minimal diarrhea and left lower quadrant pain.  He is having anorexia with pain along his left side with eating and drinking.  He reports fatigue.  He reports that his urine has been dark with decreased output.  He is having no nausea or vomiting.  He was found to be tachycardic at 126 when he arrived today.  Medications: I have reviewed  the patient's current medications.  Allergies:  Allergies  Allergen Reactions  . Ciprofloxacin Other (See Comments)    Leg tingling    Past Medical History:  Diagnosis Date  . ALLERGIC RHINITIS   . Diabetes mellitus   . Hyperlipidemia     Past Surgical History:  Procedure Laterality Date  . BIOPSY  03/15/2018   Procedure: BIOPSY;  Surgeon: Milus Banister, MD;  Location: [facility];  Service: Endoscopy;;  . CLEFT PALATE REPAIR    . COLONOSCOPY N/A 03/15/2018   Procedure: COLONOSCOPY;  Surgeon: Milus Banister, MD;  Location: [facility];  Service: Endoscopy;  Laterality: N/A;  . deviated septum repair     slight improvement  . ESOPHAGOGASTRODUODENOSCOPY N/A 03/15/2018   Procedure: ESOPHAGOGASTRODUODENOSCOPY (EGD);  Surgeon: Milus Banister, MD;  Location: [name];  Service: Endoscopy;  Laterality: N/A;  . IR IMAGING GUIDED PORT INSERTION  03/17/2018  . TONSILLECTOMY      Family History  Problem Relation Age of Onset  . Lung cancer Mother        smoker  . Brain cancer Mother        metastasis  . AAA (abdominal aortic aneurysm) Father        smoker    Social History   Socioeconomic History  . Marital status: Married    Spouse name: lisa  . Number of children: 0  . Years of education: Not on file  . Highest education level: Not on file  Occupational History  . Not on file  Social Needs  . Financial resource strain: Not hard at all  . Food insecurity:    Worry: Never true    Inability: Never true  . Transportation needs:    Medical: No    Non-medical: No  Tobacco Use  . Smoking status: Never Smoker  . Smokeless tobacco: Never Used  Substance and Sexual Activity  . Alcohol use: Yes    Comment: occasional  . Drug use: No  . Sexual activity: Yes  Lifestyle  . Physical activity:    Days per week: 0 days    Minutes per session: 0 min  . Stress: Not at all  Relationships  . Social connections:    Talks on phone: More than three times a week    Gets  together: More than three times a week    Attends religious service: 1 to 4 times per year    Active member of club or organization: No    Attends meetings of clubs or organizations: Never    Relationship status: Married  . Intimate partner violence:    Fear of current or ex partner: No    Emotionally abused: No    Physically abused: No    Forced sexual activity: No  Other Topics Concern  . Not on file  Social History Narrative   Married 1985. No kids. 4 small dogs.       Works in Financial trader, residential      Hobbies: work on cars, Haematologist, exercise as able    Past Medical History, Surgical history, Social history, and Family history were  reviewed and updated as appropriate.   Please see review of systems for further details on the patient's review from today.   Review of Systems:  Review of Systems  Constitutional: Positive for activity change and fatigue. Negative for appetite change, chills, diaphoresis and fever.  HENT: Negative for trouble swallowing.   Respiratory: Negative for cough, chest tightness and shortness of breath.   Cardiovascular: Negative for chest pain, palpitations and leg swelling.  Gastrointestinal: Positive for abdominal distention, abdominal pain and diarrhea. Negative for constipation, nausea and vomiting.       Burping    Objective:   Physical Exam:  BP (!) 122/95 (BP Location: Left Arm, Patient Position: Sitting) Comment: informed RN of all vituals signs   Pulse (!) 126   Temp 99.1 F (37.3 C) (Oral)   Resp 19   Ht 5' 9"  (1.753 m)   Wt 215 lb 3.2 oz (97.6 kg)   SpO2 99%   BMI 31.78 kg/m  ECOG: 1  Physical Exam  Constitutional: No distress.  HENT:  Head: Normocephalic and atraumatic.  Mouth/Throat: Oropharynx is clear and moist.  Cardiovascular: Regular rhythm and normal heart sounds. Tachycardia present. Exam reveals no gallop and no friction rub.  No murmur heard. Pulmonary/Chest: Effort normal and breath sounds normal.  No respiratory distress. He has no wheezes. He has no rales.  Abdominal: Soft. He exhibits no distension and no mass. Bowel sounds are decreased. There is tenderness in the left lower quadrant. There is no rebound and no guarding.  Musculoskeletal: He exhibits no edema.  Neurological: He is alert.  Skin: Skin is warm and dry. He is not diaphoretic.    Lab Review:     Component Value Date/Time   NA 138 07/17/2018 1150   K 4.0 07/17/2018 1150   CL 103 07/17/2018 1150   CO2 24 07/17/2018 1150   GLUCOSE 146 (H) 07/17/2018 1150   BUN 10 07/17/2018 1150   CREATININE 0.89 07/17/2018 1150   CREATININE 1.26 12/01/2012 1700   CALCIUM 10.0 07/17/2018 1150   PROT 6.7 07/17/2018 1150   ALBUMIN 3.3 (L) 07/17/2018 1150   AST 13 (L) 07/17/2018 1150   ALT 15 07/17/2018 1150   ALKPHOS 55 07/17/2018 1150   BILITOT 0.3 07/17/2018 1150   GFRNONAA >60 07/17/2018 1150   GFRAA >60 07/17/2018 1150       Component Value Date/Time   WBC 11.5 (H) 07/17/2018 1150   WBC 3.3 (L) 07/07/2018 0516   RBC 2.95 (L) 07/17/2018 1150   HGB 9.0 (L) 07/17/2018 1150   HCT 29.1 (L) 07/17/2018 1150   PLT 200 07/17/2018 1150   MCV 98.6 07/17/2018 1150   MCH 30.5 07/17/2018 1150   MCHC 30.9 07/17/2018 1150   RDW 18.1 (H) 07/17/2018 1150   LYMPHSABS 0.5 (L) 07/17/2018 1150   MONOABS 1.8 (H) 07/17/2018 1150   EOSABS 0.0 07/17/2018 1150   BASOSABS 0.1 07/17/2018 1150   -------------------------------  Imaging from last 24 hours (if applicable):  Radiology interpretation: Dg Abd 1 View  Result Date: 07/17/2018 CLINICAL DATA:  Left lower quadrant abdominal pain. EXAM: ABDOMEN - 1 VIEW COMPARISON:  06/09/2018 FINDINGS: Moderate amount of intestinal gas which could be due to ileus or partial obstruction. Right renal stone again seen. No acute or significant bone finding. IMPRESSION: Moderate amount of intestinal gas within small and large bowel that could be due to ileus or partial obstruction. Relative paucity of  gas projects over the pelvis. Right renal stone, chronic. Electronically Signed  By: Nelson Chimes M.D.   On: 07/17/2018 13:26        This case was discussed with Dr. Julien Nordmann. He expressed agreement with my management of this patient.

## 2018-07-21 ENCOUNTER — Encounter (HOSPITAL_COMMUNITY): Payer: Self-pay

## 2018-07-21 ENCOUNTER — Ambulatory Visit (HOSPITAL_COMMUNITY)
Admission: RE | Admit: 2018-07-21 | Discharge: 2018-07-21 | Disposition: A | Discharge status: Home / Self Care | Payer: BLUE CROSS/BLUE SHIELD | Source: Ambulatory Visit | Attending: Medical | Admitting: Medical

## 2018-07-21 ENCOUNTER — Inpatient Hospital Stay: Payer: BLUE CROSS/BLUE SHIELD

## 2018-07-21 ENCOUNTER — Other Ambulatory Visit: Payer: Self-pay | Admitting: Medical

## 2018-07-21 ENCOUNTER — Other Ambulatory Visit: Payer: Self-pay

## 2018-07-21 ENCOUNTER — Encounter (HOSPITAL_COMMUNITY): Payer: Self-pay | Admitting: Emergency Medicine

## 2018-07-21 ENCOUNTER — Inpatient Hospital Stay (HOSPITAL_COMMUNITY)
Admission: EM | Admit: 2018-07-21 | Discharge: 2018-07-24 | DRG: 388 | Disposition: A | Discharge status: Home / Self Care | Payer: BLUE CROSS/BLUE SHIELD | Attending: Internal Medicine | Admitting: Internal Medicine

## 2018-07-21 ENCOUNTER — Inpatient Hospital Stay (HOSPITAL_BASED_OUTPATIENT_CLINIC_OR_DEPARTMENT_OTHER): Payer: BLUE CROSS/BLUE SHIELD | Admitting: Medical

## 2018-07-21 ENCOUNTER — Inpatient Hospital Stay (HOSPITAL_COMMUNITY): Payer: BLUE CROSS/BLUE SHIELD

## 2018-07-21 VITALS — BP 152/92 | HR 127 | Temp 97.7°F | Resp 19 | Ht 69.0 in | Wt 219.0 lb

## 2018-07-21 DIAGNOSIS — Z79899 Other long term (current) drug therapy: Secondary | ICD-10-CM

## 2018-07-21 DIAGNOSIS — K5669 Other partial intestinal obstruction: Principal | ICD-10-CM | POA: Diagnosis present

## 2018-07-21 DIAGNOSIS — Z9221 Personal history of antineoplastic chemotherapy: Secondary | ICD-10-CM | POA: Diagnosis not present

## 2018-07-21 DIAGNOSIS — N200 Calculus of kidney: Secondary | ICD-10-CM | POA: Insufficient documentation

## 2018-07-21 DIAGNOSIS — K56699 Other intestinal obstruction unspecified as to partial versus complete obstruction: Secondary | ICD-10-CM | POA: Diagnosis not present

## 2018-07-21 DIAGNOSIS — K56600 Partial intestinal obstruction, unspecified as to cause: Secondary | ICD-10-CM

## 2018-07-21 DIAGNOSIS — E785 Hyperlipidemia, unspecified: Secondary | ICD-10-CM | POA: Diagnosis present

## 2018-07-21 DIAGNOSIS — R197 Diarrhea, unspecified: Secondary | ICD-10-CM

## 2018-07-21 DIAGNOSIS — N289 Disorder of kidney and ureter, unspecified: Secondary | ICD-10-CM | POA: Diagnosis not present

## 2018-07-21 DIAGNOSIS — T451X5A Adverse effect of antineoplastic and immunosuppressive drugs, initial encounter: Secondary | ICD-10-CM | POA: Diagnosis not present

## 2018-07-21 DIAGNOSIS — Z888 Allergy status to other drugs, medicaments and biological substances status: Secondary | ICD-10-CM

## 2018-07-21 DIAGNOSIS — Z86718 Personal history of other venous thrombosis and embolism: Secondary | ICD-10-CM | POA: Diagnosis not present

## 2018-07-21 DIAGNOSIS — D6481 Anemia due to antineoplastic chemotherapy: Secondary | ICD-10-CM | POA: Diagnosis present

## 2018-07-21 DIAGNOSIS — R112 Nausea with vomiting, unspecified: Secondary | ICD-10-CM

## 2018-07-21 DIAGNOSIS — Z794 Long term (current) use of insulin: Secondary | ICD-10-CM

## 2018-07-21 DIAGNOSIS — I2699 Other pulmonary embolism without acute cor pulmonale: Secondary | ICD-10-CM | POA: Diagnosis present

## 2018-07-21 DIAGNOSIS — E876 Hypokalemia: Secondary | ICD-10-CM | POA: Diagnosis not present

## 2018-07-21 DIAGNOSIS — C8510 Unspecified B-cell lymphoma, unspecified site: Secondary | ICD-10-CM | POA: Diagnosis present

## 2018-07-21 DIAGNOSIS — D696 Thrombocytopenia, unspecified: Secondary | ICD-10-CM | POA: Diagnosis present

## 2018-07-21 DIAGNOSIS — C8378 Burkitt lymphoma, lymph nodes of multiple sites: Secondary | ICD-10-CM | POA: Diagnosis present

## 2018-07-21 DIAGNOSIS — Z86711 Personal history of pulmonary embolism: Secondary | ICD-10-CM | POA: Diagnosis not present

## 2018-07-21 DIAGNOSIS — Z0189 Encounter for other specified special examinations: Secondary | ICD-10-CM

## 2018-07-21 DIAGNOSIS — E119 Type 2 diabetes mellitus without complications: Secondary | ICD-10-CM | POA: Diagnosis present

## 2018-07-21 DIAGNOSIS — R1032 Left lower quadrant pain: Secondary | ICD-10-CM

## 2018-07-21 DIAGNOSIS — R140 Abdominal distension (gaseous): Secondary | ICD-10-CM

## 2018-07-21 DIAGNOSIS — C8370 Burkitt lymphoma, unspecified site: Secondary | ICD-10-CM | POA: Diagnosis not present

## 2018-07-21 DIAGNOSIS — Z7901 Long term (current) use of anticoagulants: Secondary | ICD-10-CM | POA: Diagnosis not present

## 2018-07-21 DIAGNOSIS — E1169 Type 2 diabetes mellitus with other specified complication: Secondary | ICD-10-CM | POA: Diagnosis not present

## 2018-07-21 DIAGNOSIS — R599 Enlarged lymph nodes, unspecified: Secondary | ICD-10-CM

## 2018-07-21 DIAGNOSIS — R000 Tachycardia, unspecified: Secondary | ICD-10-CM | POA: Diagnosis not present

## 2018-07-21 DIAGNOSIS — K56609 Unspecified intestinal obstruction, unspecified as to partial versus complete obstruction: Secondary | ICD-10-CM | POA: Diagnosis not present

## 2018-07-21 DIAGNOSIS — Z8572 Personal history of non-Hodgkin lymphomas: Secondary | ICD-10-CM | POA: Diagnosis present

## 2018-07-21 DIAGNOSIS — Z4682 Encounter for fitting and adjustment of non-vascular catheter: Secondary | ICD-10-CM | POA: Diagnosis not present

## 2018-07-21 HISTORY — DX: Malignant (primary) neoplasm, unspecified: C80.1

## 2018-07-21 LAB — COMPREHENSIVE METABOLIC PANEL
ALK PHOS: 52 U/L (ref 38–126)
ALT: 13 U/L (ref 0–44)
AST: 13 U/L — ABNORMAL LOW (ref 15–41)
Albumin: 3.5 g/dL (ref 3.5–5.0)
Anion gap: 12 (ref 5–15)
BILIRUBIN TOTAL: 0.7 mg/dL (ref 0.3–1.2)
BUN: 23 mg/dL (ref 8–23)
CALCIUM: 9 mg/dL (ref 8.9–10.3)
CO2: 29 mmol/L (ref 22–32)
CREATININE: 0.82 mg/dL (ref 0.61–1.24)
Chloride: 95 mmol/L — ABNORMAL LOW (ref 98–111)
GFR calc non Af Amer: >60 mL/min (ref >60)
Glucose, Bld: 184 mg/dL — ABNORMAL HIGH (ref 70–99)
Potassium: 3.3 mmol/L — ABNORMAL LOW (ref 3.5–5.1)
Sodium: 136 mmol/L (ref 135–145)
TOTAL PROTEIN: 6.4 g/dL — AB (ref 6.5–8.1)

## 2018-07-21 LAB — C DIFFICILE QUICK SCREEN W PCR REFLEX
C DIFFICILE (CDIFF) INTERP: NOT DETECTED
C Diff antigen: NEGATIVE
C Diff toxin: NEGATIVE

## 2018-07-21 LAB — APTT: aPTT: 29 seconds (ref 24–36)

## 2018-07-21 LAB — URINALYSIS, ROUTINE W REFLEX MICROSCOPIC
Bilirubin Urine: NEGATIVE
GLUCOSE, UA: NEGATIVE mg/dL
HGB URINE DIPSTICK: NEGATIVE
Ketones, ur: 5 mg/dL — AB
Leukocytes, UA: NEGATIVE
Nitrite: NEGATIVE
Protein, ur: 30 mg/dL — AB
pH: 5 (ref 5.0–8.0)

## 2018-07-21 LAB — CBC WITH DIFFERENTIAL/PLATELET
ABS IMMATURE GRANULOCYTES: 0.13 10*3/uL — AB (ref 0.00–0.07)
BASOS PCT: 1 %
Basophils Absolute: 0 10*3/uL (ref 0.0–0.1)
Eosinophils Absolute: 0 10*3/uL (ref 0.0–0.5)
Eosinophils Relative: 0 %
HCT: 30 % — ABNORMAL LOW (ref 39.0–52.0)
Hemoglobin: 9.3 g/dL — ABNORMAL LOW (ref 13.0–17.0)
Immature Granulocytes: 2 %
Lymphocytes Relative: 6 %
Lymphs Abs: 0.4 10*3/uL — ABNORMAL LOW (ref 0.7–4.0)
MCH: 31 pg (ref 26.0–34.0)
MCHC: 31 g/dL (ref 30.0–36.0)
MCV: 100 fL (ref 80.0–100.0)
MONO ABS: 1 10*3/uL (ref 0.1–1.0)
MONOS PCT: 16 %
NEUTROS ABS: 4.8 10*3/uL (ref 1.7–7.7)
Neutrophils Relative %: 75 %
PLATELETS: 401 10*3/uL — AB (ref 150–400)
RBC: 3 MIL/uL — ABNORMAL LOW (ref 4.22–5.81)
RDW: 18.5 % — ABNORMAL HIGH (ref 11.5–15.5)
WBC: 6.3 10*3/uL (ref 4.0–10.5)
nRBC: 0 % (ref 0.0–0.2)

## 2018-07-21 LAB — GLUCOSE, CAPILLARY
GLUCOSE-CAPILLARY: 136 mg/dL — AB (ref 70–99)
Glucose-Capillary: 138 mg/dL — ABNORMAL HIGH (ref 70–99)

## 2018-07-21 LAB — MAGNESIUM: Magnesium: 1.8 mg/dL (ref 1.7–2.4)

## 2018-07-21 LAB — I-STAT CG4 LACTIC ACID, ED: LACTIC ACID, VENOUS: 1.06 mmol/L (ref 0.5–1.9)

## 2018-07-21 LAB — HEPARIN LEVEL (UNFRACTIONATED): Heparin Unfractionated: 1.89 IU/mL — ABNORMAL HIGH (ref 0.30–0.70)

## 2018-07-21 MED ORDER — SODIUM CHLORIDE (PF) 0.9 % IJ SOLN
INTRAMUSCULAR | Status: AC
  Filled 2018-07-21: qty 50

## 2018-07-21 MED ORDER — POTASSIUM CHLORIDE 10 MEQ/100ML IV SOLN
10.0000 meq | INTRAVENOUS | Status: AC
  Administered 2018-07-21: 10 meq via INTRAVENOUS
  Filled 2018-07-21: qty 100

## 2018-07-21 MED ORDER — HEPARIN (PORCINE) 25000 UT/250ML-% IV SOLN
1650.0000 [IU]/h | INTRAVENOUS | Status: DC
  Administered 2018-07-22: 1500 [IU]/h via INTRAVENOUS
  Administered 2018-07-23: 1650 [IU]/h via INTRAVENOUS
  Filled 2018-07-21 (×3): qty 250

## 2018-07-21 MED ORDER — SODIUM CHLORIDE 0.9 % IV SOLN
INTRAVENOUS | Status: DC
  Administered 2018-07-21 – 2018-07-23 (×4): via INTRAVENOUS

## 2018-07-21 MED ORDER — SODIUM CHLORIDE 0.9% FLUSH
10.0000 mL | INTRAVENOUS | Status: DC | PRN
  Administered 2018-07-24: 10 mL
  Filled 2018-07-21: qty 40

## 2018-07-21 MED ORDER — MORPHINE SULFATE (PF) 4 MG/ML IV SOLN
4.0000 mg | Freq: Once | INTRAVENOUS | Status: AC
  Administered 2018-07-21: 4 mg via INTRAVENOUS
  Filled 2018-07-21: qty 1

## 2018-07-21 MED ORDER — INSULIN ASPART 100 UNIT/ML ~~LOC~~ SOLN: 0.0000 [IU] | Freq: Every day | SUBCUTANEOUS | Status: DC

## 2018-07-21 MED ORDER — PANTOPRAZOLE SODIUM 40 MG IV SOLR
40.0000 mg | INTRAVENOUS | Status: DC
  Administered 2018-07-21: 40 mg via INTRAVENOUS
  Filled 2018-07-21: qty 40

## 2018-07-21 MED ORDER — DIATRIZOATE MEGLUMINE & SODIUM 66-10 % PO SOLN
90.0000 mL | Freq: Once | ORAL | Status: DC
  Filled 2018-07-21: qty 90

## 2018-07-21 MED ORDER — IOPAMIDOL (ISOVUE-300) INJECTION 61%
0.5000 mL | Freq: Once | INTRAVENOUS | Status: AC | PRN
  Administered 2018-07-21: 0.5 mL via INTRAVENOUS

## 2018-07-21 MED ORDER — MORPHINE SULFATE (PF) 2 MG/ML IV SOLN: 2.0000 mg | INTRAVENOUS | Status: DC | PRN

## 2018-07-21 MED ORDER — APIXABAN 5 MG PO TABS: 5.0000 mg | ORAL_TABLET | Freq: Two times a day (BID) | ORAL | Status: DC

## 2018-07-21 MED ORDER — SODIUM CHLORIDE 0.9 % IV BOLUS
1000.0000 mL | Freq: Once | INTRAVENOUS | Status: AC
  Administered 2018-07-21: 1000 mL via INTRAVENOUS

## 2018-07-21 MED ORDER — ONDANSETRON HCL 4 MG/2ML IJ SOLN: 4.0000 mg | Freq: Three times a day (TID) | INTRAMUSCULAR | Status: DC | PRN

## 2018-07-21 MED ORDER — IOHEXOL 300 MG/ML  SOLN
100.0000 mL | Freq: Once | INTRAMUSCULAR | Status: AC | PRN
  Administered 2018-07-21: 100 mL via INTRAVENOUS

## 2018-07-21 MED ORDER — POTASSIUM CHLORIDE 10 MEQ/100ML IV SOLN
10.0000 meq | INTRAVENOUS | Status: AC
  Administered 2018-07-21 (×4): 10 meq via INTRAVENOUS
  Filled 2018-07-21 (×4): qty 100

## 2018-07-21 MED ORDER — SODIUM CHLORIDE 0.9 % IV SOLN
Freq: Once | INTRAVENOUS | Status: AC
  Administered 2018-07-21: 10:00:00 via INTRAVENOUS
  Filled 2018-07-21: qty 250

## 2018-07-21 MED ORDER — INSULIN ASPART 100 UNIT/ML ~~LOC~~ SOLN: 0.0000 [IU] | Freq: Three times a day (TID) | SUBCUTANEOUS | Status: DC

## 2018-07-21 NOTE — Progress Notes (Signed)
Pt refuses bed alarm.

## 2018-07-21 NOTE — Progress Notes (Signed)
Pt transferred to ED Room 4 d/t anorexia and bowel issues. Scans completed and scar tissue noted around ileocecal area. Pt to have NG placed and begin bowel rest to determine if issue will resolve or if pt may need surgery.

## 2018-07-21 NOTE — ED Notes (Signed)
Patient will be transported once X-Ray verified placement of NG tube.

## 2018-07-21 NOTE — Patient Instructions (Signed)
Constipation, Adult Constipation is when a person has fewer bowel movements in a week than normal, has difficulty having a bowel movement, or has stools that are dry, hard, or larger than normal. Constipation may be caused by an underlying condition. It may become worse with age if a person takes certain medicines and does not take in enough fluids. Follow these instructions at home: Eating and drinking   Eat foods that have a lot of fiber, such as fresh fruits and vegetables, whole grains, and beans.  Limit foods that are high in fat, low in fiber, or overly processed, such as french fries, hamburgers, cookies, candies, and soda.  Drink enough fluid to keep your urine clear or pale yellow. General instructions  Exercise regularly or as told by your health care provider.  Go to the restroom when you have the urge to go. Do not hold it in.  Take over-the-counter and prescription medicines only as told by your health care provider. These include any fiber supplements.  Practice pelvic floor retraining exercises, such as deep breathing while relaxing the lower abdomen and pelvic floor relaxation during bowel movements.  Watch your condition for any changes.  Keep all follow-up visits as told by your health care provider. This is important. Contact a health care provider if:  You have pain that gets worse.  You have a fever.  You do not have a bowel movement after 4 days.  You vomit.  You are not hungry.  You lose weight.  You are bleeding from the anus.  You have thin, pencil-like stools. Get help right away if:  You have a fever and your symptoms suddenly get worse.  You leak stool or have blood in your stool.  Your abdomen is bloated.  You have severe pain in your abdomen.  You feel dizzy or you faint. This information is not intended to replace advice given to you by your health care provider. Make sure you discuss any questions you have with your health care  provider. Document Released: 05/28/2004 Document Revised: 03/19/2016 Document Reviewed: 02/18/2016 Elsevier Interactive Patient Education  2018 [employer].

## 2018-07-21 NOTE — ED Triage Notes (Signed)
Patient arrived by transport from cancer center. Pt has hx of B cell lymphoma. PT has anorexia and constipation. Pt stated they had four occurences of diarrhea last night.  Pt has had 500 mL of fluid.   Pts port has been accessed.

## 2018-07-21 NOTE — ED Notes (Signed)
ED TO INPATIENT HANDOFF REPORT  Name/Age/Gender Mark Frey 62 y.o. male  Code Status    Code Status Orders  (From admission, onward)         Start     Ordered   07/21/18 1458  Full code  Continuous     07/21/18 1458        Code Status History    Date Active Date Inactive Code Status Order ID Comments User Context   07/03/2018 1053 07/07/2018 1645 Full Code [id number]  Kale, Gautam Kishore, MD Inpatient   06/12/2018 1113 06/16/2018 1542 Full Code [id number]  Kale, Gautam Kishore, MD Inpatient   05/22/2018 1036 05/26/2018 1644 Full Code [id number]  Kale, Gautam Kishore, MD Inpatient   05/01/2018 0959 05/05/2018 2317 Full Code [id number]  Kale, Gautam Kishore, MD Inpatient   04/10/2018 1604 04/14/2018 1640 Full Code [id number]  Kale, Gautam Kishore, MD Inpatient   03/10/2018 2125 03/25/2018 2126 Full Code [id number]  Doutova, Anastassia, MD Inpatient      Home/SNF/Other Home  Chief Complaint Diarrhea  Level of Care/Admitting Diagnosis ED Disposition    ED Disposition Condition Comment   Admit  Hospital Area: [facility] [[id number]]  Level of Care: Telemetry [5]  Admit to tele based on following criteria: Complex arrhythmia (Bradycardia/Tachycardia)  Diagnosis: SBO (small bowel obstruction) (HCC) [218845]  Admitting Physician: ADHIKARI, AMRIT [[id number]]  Attending Physician: ADHIKARI, AMRIT [[id number]]  Estimated length of stay: past midnight tomorrow  Certification:: I certify this patient will need inpatient services for at least 2 midnights  PT Class (Do Not Modify): Inpatient [101]  PT Acc Code (Do Not Modify): Private [1]       Medical History Past Medical History:  Diagnosis Date  . ALLERGIC RHINITIS   . Cancer (HCC)    Lymphoma   . Diabetes mellitus   . Hyperlipidemia     Allergies Allergies  Allergen Reactions  . Ciprofloxacin Other (See Comments)    Leg tingling    IV Location/Drains/Wounds Patient Lines/Drains/Airways Status   Active  Line/Drains/Airways    Name:   Placement date:   Placement time:   Site:   Days:   Implanted Port 03/27/18   03/27/18    1141    -   116          Labs/Imaging Results for orders placed or performed during the hospital encounter of 07/21/18 (from the past 48 hour(s))  CBC with Differential     Status: Abnormal   Collection Time: 07/21/18 12:44 PM  Result Value Ref Range   WBC 6.3 4.0 - 10.5 K/uL   RBC 3.00 (L) 4.22 - 5.81 MIL/uL   Hemoglobin 9.3 (L) 13.0 - 17.0 g/dL   HCT 30.0 (L) 39.0 - 52.0 %   MCV 100.0 80.0 - 100.0 fL   MCH 31.0 26.0 - 34.0 pg   MCHC 31.0 30.0 - 36.0 g/dL   RDW 18.5 (H) 11.5 - 15.5 %   Platelets 401 (H) 150 - 400 K/uL   nRBC 0.0 0.0 - 0.2 %   Neutrophils Relative % 75 %   Neutro Abs 4.8 1.7 - 7.7 K/uL   Lymphocytes Relative 6 %   Lymphs Abs 0.4 (L) 0.7 - 4.0 K/uL   Monocytes Relative 16 %   Monocytes Absolute 1.0 0.1 - 1.0 K/uL   Eosinophils Relative 0 %   Eosinophils Absolute 0.0 0.0 - 0.5 K/uL   Basophils Relative 1 %   Basophils Absolute 0.0 0.0 - 0.1 K/uL     Immature Granulocytes 2 %   Abs Immature Granulocytes 0.13 (H) 0.00 - 0.07 K/uL    Comment: Performed at [facility], [address] [place], [place] [address]  Comprehensive metabolic panel     Status: Abnormal   Collection Time: 07/21/18 12:44 PM  Result Value Ref Range   Sodium 136 135 - 145 mmol/L   Potassium 3.3 (L) 3.5 - 5.1 mmol/L   Chloride 95 (L) 98 - 111 mmol/L   CO2 29 22 - 32 mmol/L   Glucose, Bld 184 (H) 70 - 99 mg/dL   BUN 23 8 - 23 mg/dL   Creatinine, Ser 0.82 0.61 - 1.24 mg/dL   Calcium 9.0 8.9 - 10.3 mg/dL   Total Protein 6.4 (L) 6.5 - 8.1 g/dL   Albumin 3.5 3.5 - 5.0 g/dL   AST 13 (L) 15 - 41 U/L   ALT 13 0 - 44 U/L   Alkaline Phosphatase 52 38 - 126 U/L   Total Bilirubin 0.7 0.3 - 1.2 mg/dL   GFR calc non Af Amer >60 >60 mL/min   GFR calc Af Amer >60 >60 mL/min    Comment: (NOTE) The eGFR has been calculated using the CKD EPI equation. This  calculation has not been validated in all clinical situations. eGFR's persistently <60 mL/min signify possible Chronic Kidney Disease.    Anion gap 12 5 - 15    Comment: Performed at [facility], [address] [place], [place] [address]  I-Stat CG4 Lactic Acid, ED     Status: None   Collection Time: 07/21/18  1:06 PM  Result Value Ref Range   Lactic Acid, Venous 1.06 0.5 - 1.9 mmol/L  Urinalysis, Routine w reflex microscopic     Status: Abnormal   Collection Time: 07/21/18  2:31 PM  Result Value Ref Range   Color, Urine YELLOW YELLOW   APPearance CLEAR CLEAR   Specific Gravity, Urine >1.046 (H) 1.005 - 1.030   pH 5.0 5.0 - 8.0   Glucose, UA NEGATIVE NEGATIVE mg/dL   Hgb urine dipstick NEGATIVE NEGATIVE   Bilirubin Urine NEGATIVE NEGATIVE   Ketones, ur 5 (A) NEGATIVE mg/dL   Protein, ur 30 (A) NEGATIVE mg/dL   Nitrite NEGATIVE NEGATIVE   Leukocytes, UA NEGATIVE NEGATIVE   RBC / HPF 0-5 0 - 5 RBC/hpf   WBC, UA 0-5 0 - 5 WBC/hpf   Bacteria, UA FEW (A) NONE SEEN   Mucus PRESENT     Comment: [facility] at  [facility], [address] , [place] [address]   Dg Abd 1 View  Result Date: 07/20/2018 CLINICAL DATA:  Abdominal distention and excess gas. EXAM: ABDOMEN - 1 VIEW COMPARISON:  07/17/2018. FINDINGS: Interval dilated small bowel loops. There is also an interval nodular mucosal pattern involving the filled components of the transverse, distal descending and proximal sigmoid colon. No gross free peritoneal air. Lower thoracic and mild lumbar spine degenerative changes. IMPRESSION: 1. Interval partial small bowel obstruction. 2. Interval nodular mucosal pattern involving the transverse, distal descending and proximal sigmoid colon. This could be due to infectious or inflammatory colitis. Electronically Signed   By: Steven  Reid M.D.   On: 07/20/2018 12:06   Ct Pelvis W Contrast  Result Date: 07/21/2018 CLINICAL DATA:  62-year-old with  current history of B-cell lymphoma, with evidence of partial small bowel obstruction on abdominal x-rays yesterday. CT abdomen was performed earlier today demonstrating a high-grade small-bowel obstruction, though the transition point was not visible   on that examination. EXAM: CT PELVIS WITH CONTRAST (10:40 a.m.: TECHNIQUE: Multidetector CT imaging of the pelvis was performed using the standard protocol following the bolus administration of intravenous contrast. CONTRAST:  One hundred ISOVUE-300 IOPAMIDOL INJECTION 61% IV and oral contrast which were administered at the time of the CT abdomen performed earlier same day at 8:47 a.m. COMPARISON:  CT abdomen and pelvis performed earlier same day. (These examinations will be combined into a single CT abdomen and pelvis order). PET-CT 06/09/2018, 04/18/2018. FINDINGS: Urinary Tract: See report of CT abdomen performed earlier same day as the kidneys are not included on the current examination. Urinary bladder decompressed and normal in appearance, containing contrast. Contrast within normal caliber distal LEFT ureter. Bowel: Dilated loops of small bowel throughout the pelvis with a transition to normal caliber distal and terminal ileum approximately 6 cm proximal to the ileocecal valve. A small amount of the previously administered oral contrast material is present in the normal caliber distal ileum and in the cecum and ascending colon, indicating incomplete obstruction. The transition point is at the site of previously treated lymphoma where there is scar and post treatment change. Scattered sigmoid colon diverticula are present. Liquid stool is present within normal caliber visualized colon. Vascular/Lymphatic: Scar/post treatment change in the pelvis at the site of the previously treated lymphoma in the pelvis. No residual mass in the ANTERIOR pelvis at the site of the FDG avid lymphoma on the 06/09/2018 PET/CT. No pathologic lymphadenopathy in the pelvis currently. No  visible aorto-iliofemoral atherosclerosis. Reproductive: Calcifications within normal sized prostate gland. Normal appearing seminal vesicles. Other:  None. Musculoskeletal: Degenerative changes involving the sacroiliac joints with partial ankylosis of the RIGHT SI joint. 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 Facet degenerative changes at L5-S1. No acute findings. IMPRESSION: 1. The transition point for the small-bowel obstruction is in the pelvis and appears to be approximately 6 cm proximal to the ileocecal valve. The obstruction is felt to be secondary to scar/post treatment change in the pelvis at the site of the previous lymphoma. 2. The FDG avid mass in the ANTERIOR pelvis on the recent PET-CT 06/09/2018 has resolved. No pathologic lymphadenopathy in the pelvis currently. Electronically Signed   By: Thomas  [name] M.D.   On: 07/21/2018 11:28   Ct Abdomen W Contrast  Result Date: 07/21/2018 CLINICAL DATA:  Left-sided abdominal pain and bloating. Nausea vomiting diarrhea. History of lymphoma. EXAM: CT ABDOMEN WITH CONTRAST TECHNIQUE: Multidetector CT imaging of the abdomen was performed using the standard protocol following bolus administration of intravenous contrast. CONTRAST:  100mL OMNIPAQUE IOHEXOL 300 MG/ML  SOLN COMPARISON:  PET-CT from 06/09/2018 FINDINGS: Lower chest: Subsegmental atelectasis noted within the lung bases no pleural effusion or airspace densities. Hepatobiliary: Tiny low-density structure within the dome of liver measures 4 mm and is too small to characterize. Unchanged. There is relative hypertrophy of the lateral segment of left lobe of liver. No contour abnormality identified. Gallbladder appears normal. No biliary dilatation. Pancreas: Pancreas appears normal. Spleen: Spleen is unremarkable. Adrenals/Urinary Tract: Normal adrenal glands. Small bilateral low-density kidney lesions are noted, these are less than 1 cm  and too small to reliably characterize. Right renal calculus measures 6 mm. No hydronephrosis noted bilaterally. Stomach/Bowel: Stomach appears normal. There is abnormal increase caliber of the visualized small bowel loops which measure up to 4.4 cm. There is also diffuse distension of the colon. Small and large bowel air-fluid levels are identified. The pelvic bowel loops   are not included on this exam. Vascular/Lymphatic: Mild aortic atherosclerosis. No aneurysm. No abdominopelvic adenopathy identified. Other: There is trace fluid identified along the pericolic gutters. No pneumoperitoneum. No focal fluid collections identified. Musculoskeletal: Mild spondylosis within the thoracic and lumbar spine. IMPRESSION: 1. Imaging findings concerning for high-grade bowel obstruction. As this exam is of the abdomen only the bowel loops are incompletely visualized, and therefore the transition point (etiology of the obstruction)is indeterminate. Findings may reflect a high-grade partial small bowel obstruction or distal large bowel obstruction. Metabolically active tumor within the pelvis was identified on PET scan from 06/09/2018 and could potentially reflect a site of obstruction. Consider further evaluation with complete CT of the abdomen pelvis with contrast material for more thorough assessment of the bowel loops. 2. Nonobstructing right renal calculus. Electronically Signed   By: Kerby Moors M.D.   On: 07/21/2018 09:30    Pending Labs Unresulted Labs (From admission, onward)    Start     Ordered   07/22/18 6712  Basic metabolic panel  Tomorrow morning,   R     07/21/18 1458   07/22/18 0500  CBC  Tomorrow morning,   R     07/21/18 1458   07/21/18 1459  Magnesium  Add-on,   R     07/21/18 1459   07/21/18 1443  C difficile quick scan w PCR reflex  (C Difficile quick screen w PCR reflex panel)  Once, for 24 hours,   R     07/21/18 1442   07/21/18 1306  Gastrointestinal Panel by PCR , Stool   (Gastrointestinal Panel by PCR, Stool)  Once,   R     07/21/18 1305          Vitals/Pain Today's Vitals   07/21/18 1214 07/21/18 1215 07/21/18 1300 07/21/18 1302  BP: (!) 124/103  127/77   Pulse: (!) 121   (!) 116  Resp: (!) _0 Temp: 98.3 F (36.8 C)     TempSrc: Oral     SpO2: 95%   96%  Weight:  99.3 kg    Height:  5' 9" (1.753 m)    PainSc:  0-No pain      Isolation Precautions Enteric precautions (UV disinfection)  Medications Medications  potassium chloride 10 mEq in 100 mL IVPB (10 mEq Intravenous New Bag/Given 07/21/18 1459)  apixaban (ELIQUIS) tablet 5 mg (has no administration in time range)  insulin aspart (novoLOG) injection 0-9 Units (has no administration in time range)  insulin aspart (novoLOG) injection 0-5 Units (has no administration in time range)  morphine 2 MG/ML injection 2 mg (has no administration in time range)  ondansetron (ZOFRAN) injection 4 mg (has no administration in time range)  0.9 %  sodium chloride infusion (has no administration in time range)  potassium chloride 10 mEq in 100 mL IVPB (has no administration in time range)  sodium chloride 0.9 % bolus 1,000 mL (1,000 mLs Intravenous New Bag/Given 07/21/18 1318)  morphine 4 MG/ML injection 4 mg (4 mg Intravenous Given 07/21/18 1323)    Mobility walks

## 2018-07-21 NOTE — Progress Notes (Signed)
ANTICOAGULATION CONSULT NOTE - Follow Up Consult  Pharmacy Consult for heparin Indication: bridge therapy while Eliquis for B PE and DVT on hold for SBO  Allergies  Allergen Reactions  . Ciprofloxacin Other (See Comments)    Leg tingling    Patient Measurements: Height: 5' 9"  (175.3 cm) Weight: 219 lb (99.3 kg) IBW/kg (Calculated) : 70.7 Heparin Dosing Weight: 91.7 kg  Vital Signs: Temp: 98.3 F (36.8 C) (11/08 1214) Temp Source: Oral (11/08 1214) BP: 147/86 (11/08 1330) Pulse Rate: 109 (11/08 1330)  Labs: Recent Labs    07/21/18 1244  HGB 9.3*  HCT 30.0*  PLT 401*  CREATININE 0.82    Estimated Creatinine Clearance: 108.5 mL/min (by C-G formula based on SCr of 0.82 mg/dL).   Assessment: 62 yo M with SBO.  Pharmacy consulted to dose heparin while Eliquis on hold for SBO/NPO/NGT.  Pt on Eliquis PTA for hx B PE and RLE DVT.  Pt is s/p chemo for Burkitt's lymphoma. His PTA Eliquis dose is 5 mg po bid with last dose 11/8 at 07am.  Hg 9.3, PLTC 401, SCr WNL.   Goal of Therapy:  Heparin level 0.3-0.7 units/ml aPTT 66-102 seconds Monitor platelets by anticoagulation protocol: Yes   Plan:  Get baseline aPTT and heparin level Start heparin drip at 8 pm tonight at rate of 1500 units/hr Get 8 hr aPTT/HL Daily aPTT/HL, CBC while on heparin  Eudelia Bunch, Pharm.D [phone] 07/21/2018 4:05 PM

## 2018-07-21 NOTE — Consult Note (Signed)
[facility] Surgery Consult Note  Mark Frey 06/21/1956  [id number].    Requesting MD: Mcmanus Chief Complaint/Reason for Consult: sbo  HPI:  Patient is a 62 year old male with PMH significant for Burkitt's Lymphoma whom we saw back in early July of 2019 for RLQ mass that was not amenable to surgical resection due to vascular involvement. Since then he has completed chemotherapy. 6 days ago patient began experiencing abdominal pain, distention, and nausea. He denied emesis previously but when I walked into ED room patient had bilious emesis prior to NGT insertion. Has been having diarrhea, small liquid stools. Denies bloody stools. Abdominal pain is mostly in epigastrium, dull cramping pain. Denies fever, chills, chest pain, SOB. Does report decreased urination with some dysuria. Allergic to cipro. No past abdominal surgeries. PMH otherwise significant for T2DM, HLD, and PE/DVT. Patient on eliquis  ROS: Review of Systems  Constitutional: Negative for chills and fever.  Respiratory: Negative for shortness of breath and wheezing.   Cardiovascular: Negative for chest pain.  Gastrointestinal: Positive for abdominal pain, diarrhea, nausea and vomiting. Negative for blood in stool and constipation.  Genitourinary: Positive for dysuria. Negative for frequency and urgency.  All other systems reviewed and are negative.   Family History  Problem Relation Age of Onset  . Lung cancer Mother        smoker  . Brain cancer Mother        metastasis  . AAA (abdominal aortic aneurysm) Father        smoker    Past Medical History:  Diagnosis Date  . ALLERGIC RHINITIS   . Cancer ([place])    Lymphoma   . Diabetes mellitus   . Hyperlipidemia     Past Surgical History:  Procedure Laterality Date  . BIOPSY  03/15/2018   Procedure: BIOPSY;  Surgeon: Milus Banister, MD;  Location: [facility];  Service: Endoscopy;;  . CLEFT PALATE REPAIR    . COLONOSCOPY N/A 03/15/2018   Procedure:  COLONOSCOPY;  Surgeon: Milus Banister, MD;  Location: [facility];  Service: Endoscopy;  Laterality: N/A;  . deviated septum repair     slight improvement  . ESOPHAGOGASTRODUODENOSCOPY N/A 03/15/2018   Procedure: ESOPHAGOGASTRODUODENOSCOPY (EGD);  Surgeon: Milus Banister, MD;  Location: [name];  Service: Endoscopy;  Laterality: N/A;  . IR IMAGING GUIDED PORT INSERTION  03/17/2018  . TONSILLECTOMY      Social History:  reports that he has never smoked. He has never used smokeless tobacco. He reports that he drinks alcohol. He reports that he does not use drugs.  Allergies:  Allergies  Allergen Reactions  . Ciprofloxacin Other (See Comments)    Leg tingling     (Not in a hospital admission)  Blood pressure 127/77, pulse (!) 116, temperature 98.3 F (36.8 C), temperature source Oral, resp. rate 19, height 5' 9"  (1.753 m), weight 99.3 kg, SpO2 96 %. Physical Exam: Physical Exam  Constitutional: He is oriented to person, place, and time. He appears well-developed and well-nourished. He is cooperative.  Non-toxic appearance. No distress.  HENT:  Head: Normocephalic and atraumatic.  Right Ear: External ear normal.  Left Ear: External ear normal.  Nose: Nose normal.  Mouth/Throat: Oropharynx is clear and moist and mucous membranes are normal.  Eyes: Pupils are equal, round, and reactive to light. Conjunctivae and EOM are normal. Lids are everted and swept, no foreign bodies found. No scleral icterus.  Neck: Normal range of motion. Neck supple.  Cardiovascular: Normal rate and  regular rhythm.  Pulses:      Radial pulses are 2+ on the right side, and 2+ on the left side.       Dorsalis pedis pulses are 2+ on the right side, and 2+ on the left side.  Trace edema in LEs bilaterally   Pulmonary/Chest: Effort normal and breath sounds normal.  Abdominal: Soft. He exhibits distension (mild). Bowel sounds are decreased. There is no hepatosplenomegaly. There is tenderness in the  periumbilical area and suprapubic area. There is no rigidity, no rebound and no guarding.  Musculoskeletal:  ROM grossly intact in bilateral upper and lower extremities  Neurological: He is alert and oriented to person, place, and time.  Skin: Skin is warm, dry and intact.  Psychiatric: He has a normal mood and affect. His speech is normal and behavior is normal.    Results for orders placed or performed during the hospital encounter of 07/21/18 (from the past 48 hour(s))  CBC with Differential     Status: Abnormal   Collection Time: 07/21/18 12:44 PM  Result Value Ref Range   WBC 6.3 4.0 - 10.5 K/uL   RBC 3.00 (L) 4.22 - 5.81 MIL/uL   Hemoglobin 9.3 (L) 13.0 - 17.0 g/dL   HCT 30.0 (L) 39.0 - 52.0 %   MCV 100.0 80.0 - 100.0 fL   MCH 31.0 26.0 - 34.0 pg   MCHC 31.0 30.0 - 36.0 g/dL   RDW 18.5 (H) 11.5 - 15.5 %   Platelets 401 (H) 150 - 400 K/uL   nRBC 0.0 0.0 - 0.2 %   Neutrophils Relative % 75 %   Neutro Abs 4.8 1.7 - 7.7 K/uL   Lymphocytes Relative 6 %   Lymphs Abs 0.4 (L) 0.7 - 4.0 K/uL   Monocytes Relative 16 %   Monocytes Absolute 1.0 0.1 - 1.0 K/uL   Eosinophils Relative 0 %   Eosinophils Absolute 0.0 0.0 - 0.5 K/uL   Basophils Relative 1 %   Basophils Absolute 0.0 0.0 - 0.1 K/uL   Immature Granulocytes 2 %   Abs Immature Granulocytes 0.13 (H) 0.00 - 0.07 K/uL    Comment: Performed at [facility], [place] [address] [place], [place] [address]  Comprehensive metabolic panel     Status: Abnormal   Collection Time: 07/21/18 12:44 PM  Result Value Ref Range   Sodium 136 135 - 145 mmol/L   Potassium 3.3 (L) 3.5 - 5.1 mmol/L   Chloride 95 (L) 98 - 111 mmol/L   CO2 29 22 - 32 mmol/L   Glucose, Bld 184 (H) 70 - 99 mg/dL   BUN 23 8 - 23 mg/dL   Creatinine, Ser 0.82 0.61 - 1.24 mg/dL   Calcium 9.0 8.9 - 10.3 mg/dL   Total Protein 6.4 (L) 6.5 - 8.1 g/dL   Albumin 3.5 3.5 - 5.0 g/dL   AST 13 (L) 15 - 41 U/L   ALT 13 0 - 44 U/L   Alkaline Phosphatase 52 38 -  126 U/L   Total Bilirubin 0.7 0.3 - 1.2 mg/dL   GFR calc non Af Amer >60 >60 mL/min   GFR calc Af Amer >60 >60 mL/min    Comment: (NOTE) The eGFR has been calculated using the CKD EPI equation. This calculation has not been validated in all clinical situations. eGFR's persistently <60 mL/min signify possible Chronic Kidney Disease.    Anion gap 12 5 - 15    Comment: Performed at [facility], [place]  [address] [place], [place] [address]  I-Stat CG4 Lactic Acid, ED     Status: None   Collection Time: 07/21/18  1:06 PM  Result Value Ref Range   Lactic Acid, Venous 1.06 0.5 - 1.9 mmol/L   Dg Abd 1 View  Result Date: 07/20/2018 CLINICAL DATA:  Abdominal distention and excess gas. EXAM: ABDOMEN - 1 VIEW COMPARISON:  07/17/2018. FINDINGS: Interval dilated small bowel loops. There is also an interval nodular mucosal pattern involving the filled components of the transverse, distal descending and proximal sigmoid colon. No gross free peritoneal air. Lower thoracic and mild lumbar spine degenerative changes. IMPRESSION: 1. Interval partial small bowel obstruction. 2. Interval nodular mucosal pattern involving the transverse, distal descending and proximal sigmoid colon. This could be due to infectious or inflammatory colitis. Electronically Signed   By: Claudie Revering M.D.   On: 07/20/2018 12:06   Ct Pelvis W Contrast  Result Date: 07/21/2018 CLINICAL DATA:  62 year old with current history of B-cell lymphoma, with evidence of partial small bowel obstruction on abdominal x-rays yesterday. CT abdomen was performed earlier today demonstrating a high-grade small-bowel obstruction, though the transition point was not visible on that examination. EXAM: CT PELVIS WITH CONTRAST (10:40 a.m.: TECHNIQUE: Multidetector CT imaging of the pelvis was performed using the standard protocol following the bolus administration of intravenous contrast. CONTRAST:  One hundred ISOVUE-300 IOPAMIDOL INJECTION  61% IV and oral contrast which were administered at the time of the CT abdomen performed earlier same day at 8:47 a.m. COMPARISON:  CT abdomen and pelvis performed earlier same day. (These examinations will be combined into a single CT abdomen and pelvis order). PET-CT 06/09/2018, 04/18/2018. FINDINGS: Urinary Tract: See report of CT abdomen performed earlier same day as the kidneys are not included on the current examination. Urinary bladder decompressed and normal in appearance, containing contrast. Contrast within normal caliber distal LEFT ureter. Bowel: Dilated loops of small bowel throughout the pelvis with a transition to normal caliber distal and terminal ileum approximately 6 cm proximal to the ileocecal valve. A small amount of the previously administered oral contrast material is present in the normal caliber distal ileum and in the cecum and ascending colon, indicating incomplete obstruction. The transition point is at the site of previously treated lymphoma where there is scar and post treatment change. Scattered sigmoid colon diverticula are present. Liquid stool is present within normal caliber visualized colon. Vascular/Lymphatic: Scar/post treatment change in the pelvis at the site of the previously treated lymphoma in the pelvis. No residual mass in the ANTERIOR pelvis at the site of the FDG avid lymphoma on the 06/09/2018 PET/CT. No pathologic lymphadenopathy in the pelvis currently. No visible aorto-iliofemoral atherosclerosis. Reproductive: Calcifications within normal sized prostate gland. Normal appearing seminal vesicles. Other:  None. Musculoskeletal: Degenerative changes involving the sacroiliac joints with partial ankylosis of the RIGHT SI joint. 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 Facet degenerative changes at L5-S1. No acute findings. IMPRESSION: 1. The transition point for the small-bowel obstruction is in the pelvis and  appears to be approximately 6 cm proximal to the ileocecal valve. The obstruction is felt to be secondary to scar/post treatment change in the pelvis at the site of the previous lymphoma. 2. The FDG avid mass in the ANTERIOR pelvis on the recent PET-CT 06/09/2018 has resolved. No pathologic lymphadenopathy in the pelvis currently. Electronically Signed   By: Evangeline Dakin M.D.   On: 07/21/2018 11:28   Ct Abdomen  W Contrast  Result Date: 07/21/2018 CLINICAL DATA:  Left-sided abdominal pain and bloating. Nausea vomiting diarrhea. History of lymphoma. EXAM: CT ABDOMEN WITH CONTRAST TECHNIQUE: Multidetector CT imaging of the abdomen was performed using the standard protocol following bolus administration of intravenous contrast. CONTRAST:  167m OMNIPAQUE IOHEXOL 300 MG/ML  SOLN COMPARISON:  PET-CT from 06/09/2018 FINDINGS: Lower chest: Subsegmental atelectasis noted within the lung bases no pleural effusion or airspace densities. Hepatobiliary: Tiny low-density structure within the dome of liver measures 4 mm and is too small to characterize. Unchanged. There is relative hypertrophy of the lateral segment of left lobe of liver. No contour abnormality identified. Gallbladder appears normal. No biliary dilatation. Pancreas: Pancreas appears normal. Spleen: Spleen is unremarkable. Adrenals/Urinary Tract: Normal adrenal glands. Small bilateral low-density kidney lesions are noted, these are less than 1 cm and too small to reliably characterize. Right renal calculus measures 6 mm. No hydronephrosis noted bilaterally. Stomach/Bowel: Stomach appears normal. There is abnormal increase caliber of the visualized small bowel loops which measure up to 4.4 cm. There is also diffuse distension of the colon. Small and large bowel air-fluid levels are identified. The pelvic bowel loops are not included on this exam. Vascular/Lymphatic: Mild aortic atherosclerosis. No aneurysm. No abdominopelvic adenopathy identified. Other:  There is trace fluid identified along the pericolic gutters. No pneumoperitoneum. No focal fluid collections identified. Musculoskeletal: Mild spondylosis within the thoracic and lumbar spine. IMPRESSION: 1. Imaging findings concerning for high-grade bowel obstruction. As this exam is of the abdomen only the bowel loops are incompletely visualized, and therefore the transition point (etiology of the obstruction)is indeterminate. Findings may reflect a high-grade partial small bowel obstruction or distal large bowel obstruction. Metabolically active tumor within the pelvis was identified on PET scan from 06/09/2018 and could potentially reflect a site of obstruction. Consider further evaluation with complete CT of the abdomen pelvis with contrast material for more thorough assessment of the bowel loops. 2. Nonobstructing right renal calculus. Electronically Signed   By: TKerby MoorsM.D.   On: 07/21/2018 09:30      Assessment/Plan T2DM - SSI BMI 32.34 Hx of BL PE and RLE DVT - on eliquis, hold while npo and start heparin  Burkitt's lymphoma - has finished chemotherapy  SBO - CT: sbo with transition point in the pelvis ~6 cm proximal to ileocecal valve - NGT inserted in ED with return of bilious appearing drainage - SB protocol  - hopefully this will resolve with conservative management, if not patient may require exploratory surgery  FEN: NPO, IVF; NGT to LIWS VTE: SCDs, hold eilquis, heparin gtt per pharmacy ID: no abx indicated  KBrigid Re PEncompass Health Treasure Coast RehabilitationSurgery 07/21/2018, 2:03 PM Pager: [phone] Consults: [phone]-Fri 7:00 am-4:30 pm Sat-Sun 7:00 am-11:30 am

## 2018-07-21 NOTE — ED Provider Notes (Signed)
[facility] DEPT Provider Note   CSN: [id number] Arrival date & time: 07/21/18  1159     History   Chief Complaint Chief Complaint  Patient presents with  . Diarrhea    HPI Mark Frey is a 62 y.o. male.  HPI   Patient is a 62 year old male with a history of Burkitt lymphoma, type 2 diabetes mellitus, hyperlipidemia presenting for left lower quadrant and periumbilical pain, and diarrhea.  He reports that his symptoms have been progressively worsening over the past week.  He has been repeatedly evaluated this week at [facility], and had CT scan of the abdomen and pelvis performed this morning which showed a high-grade partial small bowel obstruction or distal large bowel obstruction.  Transition point for small bowel obstruction and pelvis peers to be 6 cm proximal to ileocecal valve, and likely secondary to scar/post treatment and pelvis at site of lymphoma.  Patient reports minimal p.o. intake, but denies any nausea or vomiting.  No melena or hematochezia.  Patient denies fever.  Patient reports that he was passing gas yesterday, but is no longer today.  Patient denies any abdominal surgical history.  Patient had a Norco yesterday for pain, and morphine earlier in the week.  Of note, patient reports that he is always tachycardic in office.  Patient denies any chest pain, shortness breath or palpitations at present.  Past Medical History:  Diagnosis Date  . ALLERGIC RHINITIS   . Cancer ([place])    Lymphoma   . Diabetes mellitus   . Hyperlipidemia     Patient Active Problem List   Diagnosis Date Noted  . Burkitt lymphoma, lymph nodes of multiple sites ([place]) 05/22/2018  . Diabetes mellitus ([place])   . Edema   . Deep vein thrombosis (DVT) of femoral vein of right lower extremity ([place])   . Counseling regarding advance care planning and goals of care 04/25/2018  . Gastrointestinal hemorrhage   . Encounter for antineoplastic chemotherapy    . 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[place]) 04/10/2018  . Port-A-Cath in place 03/27/2018  . Acute deep vein thrombosis (DVT) of femoral vein of right lower extremity ([place])   . 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 Bilateral pulmonary embolism ([place]) 03/10/2018  . Anemia 03/10/2018  . Hypoglycemia 03/10/2018  . Occult blood in stools 03/10/2018  . Abdominal mass 03/10/2018  . Tachycardia 01/31/2017  . Controlled diabetes mellitus type II without complication ([place]) 44/09/270  . Hypertension 06/05/2014  . Irritable bowel syndrome 03/30/2010  . Morbid obesity ([place]) 12/29/2009  . Dysmetabolic syndrome X 53/66/4403  . BACK PAIN WITH RADICULOPATHY 06/09/2007  . Hyperlipidemia 05/12/2007  . ALLERGIC RHINITIS 05/12/2007    Past Surgical History:  Procedure Laterality Date  . BIOPSY  03/15/2018   Procedure: BIOPSY;  Surgeon: Milus Banister, MD;  Location: [facility];  Service: Endoscopy;;  . CLEFT PALATE REPAIR    . COLONOSCOPY N/A 03/15/2018   Procedure: COLONOSCOPY;  Surgeon: Milus Banister, MD;  Location: [facility];  Service: Endoscopy;  Laterality: N/A;  . deviated septum repair     slight improvement  . ESOPHAGOGASTRODUODENOSCOPY N/A 03/15/2018   Procedure: ESOPHAGOGASTRODUODENOSCOPY (EGD);  Surgeon: Milus Banister, MD;  Location: [name];  Service: Endoscopy;  Laterality: N/A;  . IR IMAGING GUIDED PORT INSERTION  03/17/2018  . TONSILLECTOMY          Home Medications    Prior to Admission medications   Medication Sig  Start Date End Date Taking? Authorizing Provider  JANUMET 50-1000 MG tablet TAKE 1 TABLET TWICE DAILY  WITH MEALS Patient taking differently: Take 1 tablet by mouth 2 (two) times daily with a meal.  11/29/17  Yes [name], MD  apixaban (ELIQUIS) 5 MG TABS tablet Take 1 tablet (5 mg total) by mouth 2 (two) times daily. 06/16/18   [name], MD  blood glucose meter kit and supplies Dispense based on patient and  insurance preference. Use daily as directed. (E11.9). 03/31/18   [name], MD  Choline Fenofibrate 135 MG capsule TAKE 1 CAPSULE DAILY Patient taking differently: Take 135 mg by mouth every evening.  11/29/17   [name], MD  furosemide (LASIX) 20 MG tablet TAKE 1 TABLET BY MOUTH DAILY HOLD AFTER 10-15 DAYS IF LEG SWELLING RESOLVED OR IF >10LBS WEIGHT LOSS Patient taking differently: Take 20 mg by mouth daily as needed for fluid or edema.  05/29/18   [name], MD  glucose blood (FREESTYLE TEST STRIPS) test strip Use to check blood sugar daily 03/30/18   [name], MD  HYDROcodone-acetaminophen (NORCO) 5-325 MG tablet Take 1-2 tablets by mouth every 4 (four) hours as needed for moderate pain. 07/17/18   [name], [name], PA-C  lidocaine-prilocaine (EMLA) cream Apply 1 application topically as needed. 03/27/18   Truitt Merle, MD  Magnesium 500 MG CAPS Take 1 capsule (500 mg total) by mouth 2 (two) times daily. Patient taking differently: Take 500 mg by mouth every evening.  06/27/18   [name], MD  metroNIDAZOLE (FLAGYL) 500 MG tablet Take 1 tablet (500 mg total) by mouth 3 (three) times daily. 07/17/18   [name], [name], PA-C  ondansetron (ZOFRAN) 8 MG tablet Take 1 tablet (8 mg total) by mouth every 8 (eight) hours as needed for nausea or vomiting. 05/05/18   [name], MD  senna-docusate (SENOKOT-S) 8.6-50 MG tablet Take 2 tablets by mouth at bedtime. Patient taking differently: Take 2 tablets by mouth daily as needed for mild constipation.  04/14/18   [name], MD    Family History Family History  Problem Relation Age of Onset  . Lung cancer Mother        smoker  . Brain cancer Mother        metastasis  . AAA (abdominal aortic aneurysm) Father        smoker    Social History Social History   Tobacco Use  . Smoking status: Never Smoker  . Smokeless tobacco: Never Used  Substance Use Topics  . Alcohol use: Yes    Comment:  occasional  . Drug use: No     Allergies   Ciprofloxacin   Review of Systems Review of Systems  Constitutional: Positive for appetite change. Negative for chills and fever.  HENT: Negative for congestion and sore throat.   Respiratory: Negative for cough, chest tightness and shortness of breath.   Cardiovascular: Negative for chest pain and palpitations.  Gastrointestinal: Positive for abdominal pain and diarrhea. Negative for constipation, nausea and vomiting.  Genitourinary: Negative for dysuria and flank pain.  Musculoskeletal: Negative for back pain and myalgias.  Skin: Negative for rash.  Allergic/Immunologic: Positive for immunocompromised state.  Neurological: Negative for dizziness and syncope.     Physical Exam Updated Vital Signs BP (!) 124/103   Pulse (!) 121   Temp 98.3 F (36.8 C) (Oral)   Resp (!) 22   Ht 5' 9"  (1.753 m)  Wt 99.3 kg   SpO2 95%   BMI 32.34 kg/m   Physical Exam  Constitutional: He appears well-developed and well-nourished. No distress.  HENT:  Head: Normocephalic and atraumatic.  Mouth/Throat: Oropharynx is clear and moist.  Eyes: Pupils are equal, round, and reactive to light. Conjunctivae and EOM are normal.  Neck: Normal range of motion. Neck supple.  Cardiovascular: Normal rate, regular rhythm, S1 normal, S2 normal and intact distal pulses.  No murmur heard. No lower extremity edema.  Bilateral lower extremities are warm and well perfused.  Pulmonary/Chest: Effort normal and breath sounds normal. He has no wheezes. He has no rales.  Abdominal: Soft. He exhibits distension. There is tenderness. There is no guarding.  Patient has hypoactive bowel sounds.  Abdomen tympanitic to percussion.  Musculoskeletal: Normal range of motion. He exhibits no edema or deformity.  Neurological: He is alert.  Cranial nerves grossly intact. Patient moves extremities symmetrically and with good coordination.  Skin: Skin is warm and dry. No rash  noted. No erythema.  Psychiatric: He has a normal mood and affect. His behavior is normal. Judgment and thought content normal.  Nursing note and vitals reviewed.    ED Treatments / Results  Labs (all labs ordered are listed, but only abnormal results are displayed) Labs Reviewed  GASTROINTESTINAL PANEL BY PCR, STOOL (REPLACES STOOL CULTURE)  CBC WITH DIFFERENTIAL/PLATELET  COMPREHENSIVE METABOLIC PANEL  URINALYSIS, ROUTINE W REFLEX MICROSCOPIC  I-STAT CG4 LACTIC ACID, ED    EKG None  Radiology Dg Abd 1 View  Result Date: 07/20/2018 CLINICAL DATA:  Abdominal distention and excess gas. EXAM: ABDOMEN - 1 VIEW COMPARISON:  07/17/2018. FINDINGS: Interval dilated small bowel loops. There is also an interval nodular mucosal pattern involving the filled components of the transverse, distal descending and proximal sigmoid colon. No gross free peritoneal air. Lower thoracic and mild lumbar spine degenerative changes. IMPRESSION: 1. Interval partial small bowel obstruction. 2. Interval nodular mucosal pattern involving the transverse, distal descending and proximal sigmoid colon. This could be due to infectious or inflammatory colitis. Electronically Signed   By: Claudie Revering M.D.   On: 07/20/2018 12:06   Ct Pelvis W Contrast  Result Date: 07/21/2018 CLINICAL DATA:  62 year old with current history of B-cell lymphoma, with evidence of partial small bowel obstruction on abdominal x-rays yesterday. CT abdomen was performed earlier today demonstrating a high-grade small-bowel obstruction, though the transition point was not visible on that examination. EXAM: CT PELVIS WITH CONTRAST (10:40 a.m.: TECHNIQUE: Multidetector CT imaging of the pelvis was performed using the standard protocol following the bolus administration of intravenous contrast. CONTRAST:  One hundred ISOVUE-300 IOPAMIDOL INJECTION 61% IV and oral contrast which were administered at the time of the CT abdomen performed earlier same day  at 8:47 a.m. COMPARISON:  CT abdomen and pelvis performed earlier same day. (These examinations will be combined into a single CT abdomen and pelvis order). PET-CT 06/09/2018, 04/18/2018. FINDINGS: Urinary Tract: See report of CT abdomen performed earlier same day as the kidneys are not included on the current examination. Urinary bladder decompressed and normal in appearance, containing contrast. Contrast within normal caliber distal LEFT ureter. Bowel: Dilated loops of small bowel throughout the pelvis with a transition to normal caliber distal and terminal ileum approximately 6 cm proximal to the ileocecal valve. A small amount of the previously administered oral contrast material is present in the normal caliber distal ileum and in the cecum and ascending colon, indicating incomplete obstruction. The transition point is at the  site of previously treated lymphoma where there is scar and post treatment change. Scattered sigmoid colon diverticula are present. Liquid stool is present within normal caliber visualized colon. Vascular/Lymphatic: Scar/post treatment change in the pelvis at the site of the previously treated lymphoma in the pelvis. No residual mass in the ANTERIOR pelvis at the site of the FDG avid lymphoma on the 06/09/2018 PET/CT. No pathologic lymphadenopathy in the pelvis currently. No visible aorto-iliofemoral atherosclerosis. Reproductive: Calcifications within normal sized prostate gland. Normal appearing seminal vesicles. Other:  None. Musculoskeletal: Degenerative changes involving the sacroiliac joints with partial ankylosis of the RIGHT SI joint. 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 Facet degenerative changes at L5-S1. No acute findings. IMPRESSION: 1. The transition point for the small-bowel obstruction is in the pelvis and appears to be approximately 6 cm proximal to the ileocecal valve. The obstruction is felt to be secondary  to scar/post treatment change in the pelvis at the site of the previous lymphoma. 2. The FDG avid mass in the ANTERIOR pelvis on the recent PET-CT 06/09/2018 has resolved. No pathologic lymphadenopathy in the pelvis currently. Electronically Signed   By: Evangeline Dakin M.D.   On: 07/21/2018 11:28   Ct Abdomen W Contrast  Result Date: 07/21/2018 CLINICAL DATA:  Left-sided abdominal pain and bloating. Nausea vomiting diarrhea. History of lymphoma. EXAM: CT ABDOMEN WITH CONTRAST TECHNIQUE: Multidetector CT imaging of the abdomen was performed using the standard protocol following bolus administration of intravenous contrast. CONTRAST:  135m OMNIPAQUE IOHEXOL 300 MG/ML  SOLN COMPARISON:  PET-CT from 06/09/2018 FINDINGS: Lower chest: Subsegmental atelectasis noted within the lung bases no pleural effusion or airspace densities. Hepatobiliary: Tiny low-density structure within the dome of liver measures 4 mm and is too small to characterize. Unchanged. There is relative hypertrophy of the lateral segment of left lobe of liver. No contour abnormality identified. Gallbladder appears normal. No biliary dilatation. Pancreas: Pancreas appears normal. Spleen: Spleen is unremarkable. Adrenals/Urinary Tract: Normal adrenal glands. Small bilateral low-density kidney lesions are noted, these are less than 1 cm and too small to reliably characterize. Right renal calculus measures 6 mm. No hydronephrosis noted bilaterally. Stomach/Bowel: Stomach appears normal. There is abnormal increase caliber of the visualized small bowel loops which measure up to 4.4 cm. There is also diffuse distension of the colon. Small and large bowel air-fluid levels are identified. The pelvic bowel loops are not included on this exam. Vascular/Lymphatic: Mild aortic atherosclerosis. No aneurysm. No abdominopelvic adenopathy identified. Other: There is trace fluid identified along the pericolic gutters. No pneumoperitoneum. No focal fluid collections  identified. Musculoskeletal: Mild spondylosis within the thoracic and lumbar spine. IMPRESSION: 1. Imaging findings concerning for high-grade bowel obstruction. As this exam is of the abdomen only the bowel loops are incompletely visualized, and therefore the transition point (etiology of the obstruction)is indeterminate. Findings may reflect a high-grade partial small bowel obstruction or distal large bowel obstruction. Metabolically active tumor within the pelvis was identified on PET scan from 06/09/2018 and could potentially reflect a site of obstruction. Consider further evaluation with complete CT of the abdomen pelvis with contrast material for more thorough assessment of the bowel loops. 2. Nonobstructing right renal calculus. Electronically Signed   By: TKerby MoorsM.D.   On: 07/21/2018 09:30    Procedures Procedures (including critical care time)  Medications Ordered in ED Medications  sodium chloride 0.9 % bolus 1,000 mL (has no administration in time range)     Initial  Impression / Assessment and Plan / ED Course  I have reviewed the triage vital signs and the nursing notes.  Pertinent labs & imaging results that were available during my care of the patient were reviewed by me and considered in my medical decision making (see chart for details).  Clinical Course as of Jul 21 2013  Fri Jul 21, 2018  1320 Case discussed with Dr. Francine Graven.   [AM]  [phone] Spoke with surgical physician assistant who recommends NG tube and admission to medicine with surgical consultation inpatient.  I appreciate their involvement in the care of this patient.   [AM]  87 Spoke with Dr. Tawanna Solo who will admit patient.  I appreciate his involvement in the care of this patient.   [AM]  1449 Code sepsis discontinued. Ordered incorrectly.   [AM]    Clinical Course User Index [AM] Albesa Seen, PA-C    Patient is nontoxic-appearing and has nonsurgical abdomen.  Patient presents with  completed CT scan this morning showing bowel obstruction, incomplete 6 cm proximal to the ileocecal valve.  It appears to be at a site of prior scarring.  General surgery recommends medical admission with NG tube placement.  They will consult inpatient.  I appreciate their involvement.  Patient has tachycardia to 116.  On my evaluation, he is nontoxic, not tachypneic, and not hypotensive.  Patient has family relate to me that he has a history of tachycardia in the office, and I verified this in the record.  I do also suspect that there may be an element of volume depletion given his diarrhea.  I do not suspect sepsis.  CT shows no evidence of inflammatory pathology within the abdomen and pelvis.  Work-up is demonstrating no leukocytosis.  Patient does have anemia that is progressively improving since his most recent chemotherapy infusion.  Lactic acid is normal.  Patient is a potassium 3.3, consistent with episodes of diarrhea.  We will replete IV.  Patient is n.p.o.  Will obtain GI panel due to recent immunocompromise status.  Patient admitted to medicine with surgical consultation.  I appreciate their involvement in the care of this patient.  NG tube placed by ED RN.  This is a supervised visit with Dr. Maryjo Rochester. Evaluation, management, and discharge planning discussed with this attending physician.  Final Clinical Impressions(s) / ED Diagnoses   Final diagnoses:  Intestinal obstruction, unspecified cause, unspecified whether partial or complete [facility])  Hypokalemia    ED Discharge Orders    None       Albesa Seen, PA-C 07/21/18 2015    Francine Graven, DO 07/22/18 [phone]

## 2018-07-21 NOTE — ED Notes (Signed)
Bed: WA04 Expected date:  Expected time:  Means of arrival:  Comments: Pt from [facility]

## 2018-07-21 NOTE — H&P (Signed)
History and Physical    Mark Frey BWG:[id number] DOB: November 08, 1955 DOA: 07/21/2018  PCP: Mark Olp, MD   Patient coming from: [facility]    Chief Complaint: Diarrhea, abdominal pain  HPI: Mark Frey is a 62 y.o. male with medical history significant of Burkitt lymphoma, type 2 diabetes mellitus who was sent from the cancer center after CT scan of the abdomen and pelvis showed high-grade small bowel obstruction.  Patient has been having left lower quadrant and periumbilical pain and diarrhea since last couple of days. He has been following at the cancer center due to these  issues. He reported of having minimal oral intake and denies any nausea or vomiting.  He has history of Burkitt lymphoma and follows with oncology,[name].  During the follow-up at the cancer center today, CT scan was done with the above findings. Patient seen and examined the bedside in the emergency department.  He was noted to be tachycardic, blood pressure stable.  Abdomen pain has improved.  NG tube had just been inserted. He denies any chest pain, shortness of breath, headache, fever, chills, dysuria ,malena,hematochezia.  ED Course: General surgery called.  NG tube inserted.    Past Medical History:  Diagnosis Date  . ALLERGIC RHINITIS   . Cancer ([place])    Lymphoma   . Diabetes mellitus   . Hyperlipidemia     Past Surgical History:  Procedure Laterality Date  . BIOPSY  03/15/2018   Procedure: BIOPSY;  Surgeon: Mark Banister, MD;  Location: [facility];  Service: Endoscopy;;  . CLEFT PALATE REPAIR    . COLONOSCOPY N/A 03/15/2018   Procedure: COLONOSCOPY;  Surgeon: Mark Banister, MD;  Location: [facility];  Service: Endoscopy;  Laterality: N/A;  . deviated septum repair     slight improvement  . ESOPHAGOGASTRODUODENOSCOPY N/A 03/15/2018   Procedure: ESOPHAGOGASTRODUODENOSCOPY (EGD);  Surgeon: Mark Banister, MD;  Location: [name];  Service: Endoscopy;  Laterality: N/A;  .  IR IMAGING GUIDED PORT INSERTION  03/17/2018  . TONSILLECTOMY       reports that he has never smoked. He has never used smokeless tobacco. He reports that he drinks alcohol. He reports that he does not use drugs.  Allergies  Allergen Reactions  . Ciprofloxacin Other (See Comments)    Leg tingling    Family History  Problem Relation Age of Onset  . Lung cancer Mother        smoker  . Brain cancer Mother        metastasis  . AAA (abdominal aortic aneurysm) Father        smoker     Prior to Admission medications   Medication Sig Start Date End Date Taking? Authorizing Provider  JANUMET 50-1000 MG tablet TAKE 1 TABLET TWICE DAILY  WITH MEALS Patient taking differently: Take 1 tablet by mouth 2 (two) times daily with a meal.  11/29/17  Yes Mark Olp, MD  apixaban (ELIQUIS) 5 MG TABS tablet Take 1 tablet (5 mg total) by mouth 2 (two) times daily. 06/16/18   [name], MD  blood glucose meter kit and supplies Dispense based on patient and insurance preference. Use daily as directed. (E11.9). 03/31/18   Mark Olp, MD  Choline Fenofibrate 135 MG capsule TAKE 1 CAPSULE DAILY Patient taking differently: Take 135 mg by mouth every evening.  11/29/17   Mark Olp, MD  furosemide (LASIX) 20 MG tablet TAKE 1 TABLET BY MOUTH DAILY HOLD AFTER 10-15  DAYS IF LEG SWELLING RESOLVED OR IF >10LBS WEIGHT LOSS Patient taking differently: Take 20 mg by mouth daily as needed for fluid or edema.  05/29/18   [name], MD  glucose blood (FREESTYLE TEST STRIPS) test strip Use to check blood sugar daily 03/30/18   Mark Olp, MD  HYDROcodone-acetaminophen (NORCO) 5-325 MG tablet Take 1-2 tablets by mouth every 4 (four) hours as needed for moderate pain. 07/17/18   Frey, Mark Code., PA-C  lidocaine-prilocaine (EMLA) cream Apply 1 application topically as needed. 03/27/18   Truitt Merle, MD  Magnesium 500 MG CAPS Take 1 capsule (500 mg total) by mouth 2 (two) times  daily. Patient taking differently: Take 500 mg by mouth every evening.  06/27/18   [name], MD  metroNIDAZOLE (FLAGYL) 500 MG tablet Take 1 tablet (500 mg total) by mouth 3 (three) times daily. 07/17/18   Frey, Mark Code., PA-C  ondansetron (ZOFRAN) 8 MG tablet Take 1 tablet (8 mg total) by mouth every 8 (eight) hours as needed for nausea or vomiting. 05/05/18   [name], MD  senna-docusate (SENOKOT-S) 8.6-50 MG tablet Take 2 tablets by mouth at bedtime. Patient taking differently: Take 2 tablets by mouth daily as needed for mild constipation.  04/14/18   [name], MD    Physical Exam: Vitals:   07/21/18 1214 07/21/18 1215 07/21/18 1300 07/21/18 1302  BP: (!) 124/103  127/77   Pulse: (!) 121   (!) 116  Resp: (!) _0 Temp: 98.3 F (36.8 C)     TempSrc: Oral     SpO2: 95%   96%  Weight:  99.3 kg    Height:  5' 9" (1.753 m)      Constitutional: Not in distress ,obese Vitals:   07/21/18 1214 07/21/18 1215 07/21/18 1300 07/21/18 1302  BP: (!) 124/103  127/77   Pulse: (!) 121   (!) 116  Resp: (!) _1 Temp: 98.3 F (36.8 C)     TempSrc: Oral     SpO2: 95%   96%  Weight:  99.3 kg    Height:  5' 9" (1.753 m)     Eyes: PERRL, lids and conjunctivae normal ENMT: Mucous membranes are moist. Posterior pharynx clear of any exudate or lesions.Normal dentition.  Neck: normal, supple, no masses, no thyromegaly Respiratory: clear to auscultation bilaterally, no wheezing, no crackles. Normal respiratory effort. No accessory muscle use.  Cardiovascular: Regular rate and rhythm, no murmurs / rubs / gallops. No extremity edema. 2+ pedal pulses. No carotid bruits.  Chemo-Port on the right chest Abdomen: Mild generalized tenderness, sluggish bowel sounds Musculoskeletal: no clubbing / cyanosis. No joint deformity upper and lower extremities. Good ROM, no contractures. Normal muscle tone.  Skin: no rashes, lesions, ulcers. No induration Neurologic: CN  2-12 grossly intact. Sensation intact, DTR normal. Strength 5/5 in all 4.  Psychiatric: Normal judgment and insight. Alert and oriented x 3. Normal mood.   Foley Catheter:None  Labs on Admission: I have personally reviewed following labs and imaging studies  CBC: Recent Labs  Lab 07/17/18 1150 07/21/18 1244  WBC 11.5* 6.3  NEUTROABS 7.9* 4.8  HGB 9.0* 9.3*  HCT 29.1* 30.0*  MCV 98.6 100.0  PLT 200 527*   Basic Metabolic Panel: Recent Labs  Lab 07/17/18 1150 07/21/18 1244  NA 138 136  K 4.0 3.3*  CL 103 95*  CO2 24 29  GLUCOSE 146* 184*  BUN 10 23  CREATININE 0.89 0.82  CALCIUM 10.0 9.0   GFR: Estimated Creatinine Clearance: 108.5 mL/min (by C-G formula based on SCr of 0.82 mg/dL). Liver Function Tests: Recent Labs  Lab 07/17/18 1150 07/21/18 1244  AST 13* 13*  ALT 15 13  ALKPHOS 55 52  BILITOT 0.3 0.7  PROT 6.7 6.4*  ALBUMIN 3.3* 3.5   No results for input(s): LIPASE, AMYLASE in the last 168 hours. No results for input(s): AMMONIA in the last 168 hours. Coagulation Profile: No results for input(s): INR, PROTIME in the last 168 hours. Cardiac Enzymes: No results for input(s): CKTOTAL, CKMB, CKMBINDEX, TROPONINI in the last 168 hours. BNP (last 3 results) No results for input(s): PROBNP in the last 8760 hours. HbA1C: No results for input(s): HGBA1C in the last 72 hours. CBG: No results for input(s): GLUCAP in the last 168 hours. Lipid Profile: No results for input(s): CHOL, HDL, LDLCALC, TRIG, CHOLHDL, LDLDIRECT in the last 72 hours. Thyroid Function Tests: No results for input(s): TSH, T4TOTAL, FREET4, T3FREE, THYROIDAB in the last 72 hours. Anemia Panel: No results for input(s): VITAMINB12, FOLATE, FERRITIN, TIBC, IRON, RETICCTPCT in the last 72 hours. Urine analysis:    Component Value Date/Time   COLORURINE YELLOW 07/21/2018 1431   APPEARANCEUR CLEAR 07/21/2018 1431   LABSPEC >1.046 (H) 07/21/2018 1431   PHURINE 5.0 07/21/2018 1431   GLUCOSEU  NEGATIVE 07/21/2018 1431   HGBUR NEGATIVE 07/21/2018 1431   HGBUR negative 01/20/2009 0840   BILIRUBINUR NEGATIVE 07/21/2018 1431   BILIRUBINUR Small 01/30/2018 1652   KETONESUR 5 (A) 07/21/2018 1431   PROTEINUR 30 (A) 07/21/2018 1431   UROBILINOGEN 2.0 (A) 01/30/2018 1652   UROBILINOGEN 0.2 01/20/2009 0840   NITRITE NEGATIVE 07/21/2018 1431   LEUKOCYTESUR NEGATIVE 07/21/2018 1431    Radiological Exams on Admission: Dg Abd 1 View  Result Date: 07/20/2018 CLINICAL DATA:  Abdominal distention and excess gas. EXAM: ABDOMEN - 1 VIEW COMPARISON:  07/17/2018. FINDINGS: Interval dilated small bowel loops. There is also an interval nodular mucosal pattern involving the filled components of the transverse, distal descending and proximal sigmoid colon. No gross free peritoneal air. Lower thoracic and mild lumbar spine degenerative changes. IMPRESSION: 1. Interval partial small bowel obstruction. 2. Interval nodular mucosal pattern involving the transverse, distal descending and proximal sigmoid colon. This could be due to infectious or inflammatory colitis. Electronically Signed   By: Claudie Revering M.D.   On: 07/20/2018 12:06   Ct Pelvis W Contrast  Result Date: 07/21/2018 CLINICAL DATA:  62 year old with current history of B-cell lymphoma, with evidence of partial small bowel obstruction on abdominal x-rays yesterday. CT abdomen was performed earlier today demonstrating a high-grade small-bowel obstruction, though the transition point was not visible on that examination. EXAM: CT PELVIS WITH CONTRAST (10:40 a.m.: TECHNIQUE: Multidetector CT imaging of the pelvis was performed using the standard protocol following the bolus administration of intravenous contrast. CONTRAST:  One hundred ISOVUE-300 IOPAMIDOL INJECTION 61% IV and oral contrast which were administered at the time of the CT abdomen performed earlier same day at 8:47 a.m. COMPARISON:  CT abdomen and pelvis performed earlier same day. (These  examinations will be combined into a single CT abdomen and pelvis order). PET-CT 06/09/2018, 04/18/2018. FINDINGS: Urinary Tract: See report of CT abdomen performed earlier same day as the kidneys are not included on the current examination. Urinary bladder decompressed and normal in appearance, containing contrast. Contrast within normal caliber distal LEFT ureter. Bowel: Dilated loops of small bowel throughout the pelvis with a transition  to normal caliber distal and terminal ileum approximately 6 cm proximal to the ileocecal valve. A small amount of the previously administered oral contrast material is present in the normal caliber distal ileum and in the cecum and ascending colon, indicating incomplete obstruction. The transition point is at the site of previously treated lymphoma where there is scar and post treatment change. Scattered sigmoid colon diverticula are present. Liquid stool is present within normal caliber visualized colon. Vascular/Lymphatic: Scar/post treatment change in the pelvis at the site of the previously treated lymphoma in the pelvis. No residual mass in the ANTERIOR pelvis at the site of the FDG avid lymphoma on the 06/09/2018 PET/CT. No pathologic lymphadenopathy in the pelvis currently. No visible aorto-iliofemoral atherosclerosis. Reproductive: Calcifications within normal sized prostate gland. Normal appearing seminal vesicles. Other:  None. Musculoskeletal: Degenerative changes involving the sacroiliac joints with partial ankylosis of the RIGHT SI joint. 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 Facet degenerative changes at L5-S1. No acute findings. IMPRESSION: 1. The transition point for the small-bowel obstruction is in the pelvis and appears to be approximately 6 cm proximal to the ileocecal valve. The obstruction is felt to be secondary to scar/post treatment change in the pelvis at the site of the previous lymphoma.  2. The FDG avid mass in the ANTERIOR pelvis on the recent PET-CT 06/09/2018 has resolved. No pathologic lymphadenopathy in the pelvis currently. Electronically Signed   By: Evangeline Dakin M.D.   On: 07/21/2018 11:28   Ct Abdomen W Contrast  Result Date: 07/21/2018 CLINICAL DATA:  Left-sided abdominal pain and bloating. Nausea vomiting diarrhea. History of lymphoma. EXAM: CT ABDOMEN WITH CONTRAST TECHNIQUE: Multidetector CT imaging of the abdomen was performed using the standard protocol following bolus administration of intravenous contrast. CONTRAST:  167m OMNIPAQUE IOHEXOL 300 MG/ML  SOLN COMPARISON:  PET-CT from 06/09/2018 FINDINGS: Lower chest: Subsegmental atelectasis noted within the lung bases no pleural effusion or airspace densities. Hepatobiliary: Tiny low-density structure within the dome of liver measures 4 mm and is too small to characterize. Unchanged. There is relative hypertrophy of the lateral segment of left lobe of liver. No contour abnormality identified. Gallbladder appears normal. No biliary dilatation. Pancreas: Pancreas appears normal. Spleen: Spleen is unremarkable. Adrenals/Urinary Tract: Normal adrenal glands. Small bilateral low-density kidney lesions are noted, these are less than 1 cm and too small to reliably characterize. Right renal calculus measures 6 mm. No hydronephrosis noted bilaterally. Stomach/Bowel: Stomach appears normal. There is abnormal increase caliber of the visualized small bowel loops which measure up to 4.4 cm. There is also diffuse distension of the colon. Small and large bowel air-fluid levels are identified. The pelvic bowel loops are not included on this exam. Vascular/Lymphatic: Mild aortic atherosclerosis. No aneurysm. No abdominopelvic adenopathy identified. Other: There is trace fluid identified along the pericolic gutters. No pneumoperitoneum. No focal fluid collections identified. Musculoskeletal: Mild spondylosis within the thoracic and lumbar  spine. IMPRESSION: 1. Imaging findings concerning for high-grade bowel obstruction. As this exam is of the abdomen only the bowel loops are incompletely visualized, and therefore the transition point (etiology of the obstruction)is indeterminate. Findings may reflect a high-grade partial small bowel obstruction or distal large bowel obstruction. Metabolically active tumor within the pelvis was identified on PET scan from 06/09/2018 and could potentially reflect a site of obstruction. Consider further evaluation with complete CT of the abdomen pelvis with contrast material for more thorough assessment of the bowel loops. 2. Nonobstructing right renal  calculus. Electronically Signed   By: Kerby Moors M.D.   On: 07/21/2018 09:30     Assessment/Plan Principal Problem:   SBO (small bowel obstruction) (HCC) Active Problems:   Bilateral pulmonary embolism (HCC)   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HCC)   Diabetes mellitus (HCC)   Burkitt lymphoma, lymph nodes of multiple sites (HCC)   Hypokalemia   Diarrhea  Bowel obstruction:Imaging showed high-grade bowel obstruction.Transition point for the small-bowel obstruction is in the pelvis and appears to be approximately 6 cm proximal to the ileocecal valve. The obstruction is felt to be secondary to scar/post treatment change in the pelvis at the site of the previous lymphoma. Started on conservative management.  NG tube inserted.  We will keep him n.p.o.  We will continue IV fluids, Zofran.  Pain management General surgery will follow.  Hypokalemia: Supplemented with potassium.  We will continue to monitor the levels.Check mg level  History of PE/DVT: On Eliquis.Patient is on NG tube.  I do not think patient needs to be on heparin drip right away.  We can wait  until he is able to take orally and then we can restart Eliquis.He has history of GI bleed from lymphoma in the past.  High-grade B-cell lymphoma/Burkitt lymphoma: Follows with oncology, [name].On  chemotherapy.  Diabetes mellitus type 2: Continue sliding scale insulin here.On Janumet at home.  Diarrhea: We will check GI pathogen panel and C. Difficile.  Continue IV fluids  Sinus tachycardia: Most likely secondary to hydration, pain.  As per the wife, he has chronic tachycardia. Continue IV fluids, pain management.    Severity of Illness: The appropriate patient status for this patient is INPATIENT.    DVT prophylaxis: SCD Code Status: Full Family Communication: None present at the bedside Consults called: General surgery     [name] MD [facility] Pager [phone]  If 7PM-7AM, please contact night-coverage [email or web address] Password [email or web address]  07/21/2018, 2:59 PM

## 2018-07-21 NOTE — Progress Notes (Addendum)
Sympt[facility] Management Clinic Progress Note   PAARTH CROPPER [id number] [birth year] 62 y.o.  [name]  is managed by Dr. 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 Trentman is status post cycle 6 of EPOCH-R dosed on 07/03/2018 under the care of Dr. Sullivan Lone.  A CT scan of the abdomen and pelvis returned with results as noted below.  High-grade intestinal obstruction: The patient was seen along with Dr. Burr Medico.  The results of his CT scan of his abdomen and pelvis are noted below.  The patient was taken to the emergency room at [facility] for immediate management.  The patient will likely be admitted and may require a surgical consult.  EXAM: CT ABDOMEN WITH CONTRAST  TECHNIQUE: Multidetector CT imaging of the abdomen was performed using the standard protocol following bolus administration of intravenous contrast.  CONTRAST:  162m OMNIPAQUE IOHEXOL 300 MG/ML  SOLN  COMPARISON:  PET-CT from 06/09/2018  FINDINGS: Lower chest: Subsegmental atelectasis noted within the lung bases no pleural effusion or airspace densities.  Hepatobiliary: Tiny low-density structure within the dome of liver measures 4 mm and is too small to characterize. Unchanged. There is relative hypertrophy of the lateral segment of left lobe of liver. No contour abnormality identified. Gallbladder appears normal. No biliary dilatation.  Pancreas: Pancreas appears normal.  Spleen: Spleen is unremarkable.  Adrenals/Urinary Tract: Normal adrenal glands. Small bilateral low-density kidney lesions are noted, these are less than 1 cm and too small to reliably  characterize. Right renal calculus measures 6 mm. No hydronephrosis noted bilaterally.  Stomach/Bowel: Stomach appears normal. There is abnormal increase caliber of the visualized small bowel loops which measure up to 4.4 cm. There is also diffuse distension of the colon. Small and large bowel air-fluid levels are identified. The pelvic bowel loops are not included on this exam.  Vascular/Lymphatic: Mild aortic atherosclerosis. No aneurysm. No abdominopelvic adenopathy identified.  Other: There is trace fluid identified along the pericolic gutters. No pneumoperitoneum. No focal fluid collections identified.  Musculoskeletal: Mild spondylosis within the thoracic and lumbar spine.  IMPRESSION: 1. Imaging findings concerning for high-grade bowel obstruction. As this exam is of the abdomen only the bowel loops are incompletely visualized, and therefore the transition point (etiology of the obstruction)is indeterminate. Findings may reflect a high-grade partial small bowel obstruction or distal large bowel obstruction. Metabolically active tumor within the pelvis was identified on PET scan from 06/09/2018 and could potentially reflect a site of obstruction. Consider further evaluation with complete CT of the abdomen pelvis with contrast material for more thorough assessment of the bowel loops.  2. Nonobstructing right renal calculus.   EXAM: CT PELVIS WITH CONTRAST (10:40 a.m.:  TECHNIQUE: Multidetector CT imaging of the pelvis was performed using the standard protocol following the bolus administration of intravenous contrast.  CONTRAST:  One hundred ISOVUE-300 IOPAMIDOL INJECTION 61% IV and oral contrast which were administered at the time of the CT abdomen performed earlier same day at 8:47 a.m.  COMPARISON:  CT abdomen and pelvis performed earlier same day. (These examinations will be combined into a single CT abdomen and pelvis order). PET-CT 06/09/2018,  04/18/2018.  FINDINGS: Urinary Tract: See report of CT abdomen performed earlier same day as the kidneys  are not included on the current examination. Urinary bladder decompressed and normal in appearance, containing contrast. Contrast within normal caliber distal LEFT ureter.  Bowel: Dilated loops of small bowel throughout the pelvis with a transition to normal caliber distal and terminal ileum approximately 6 cm proximal to the ileocecal valve. A small amount of the previously administered oral contrast material is present in the normal caliber distal ileum and in the cecum and ascending colon, indicating incomplete obstruction. The transition point is at the site of previously treated lymphoma where there is scar and post treatment change. Scattered sigmoid colon diverticula are present. Liquid stool is present within normal caliber visualized colon.  Vascular/Lymphatic: Scar/post treatment change in the pelvis at the site of the previously treated lymphoma in the pelvis. No residual mass in the ANTERIOR pelvis at the site of the FDG avid lymphoma on the 06/09/2018 PET/CT. No pathologic lymphadenopathy in the pelvis currently.  No visible aorto-iliofemoral atherosclerosis.  Reproductive: Calcifications within normal sized prostate gland. Normal appearing seminal vesicles.  Other:  None.  Musculoskeletal: Degenerative changes involving the sacroiliac joints with partial ankylosis of the RIGHT SI joint. 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 Facet degenerative changes at L5-S1. No acute findings.  IMPRESSION: 1. The transition point for the small-bowel obstruction is in the pelvis and appears to be approximately 6 cm proximal to the ileocecal valve. The obstruction is felt to be secondary to scar/post treatment change in the pelvis at the site of the previous lymphoma. 2. The FDG avid mass in the ANTERIOR  pelvis on the recent PET-CT 06/09/2018 has resolved. No pathologic lymphadenopathy in the pelvis currently.   Please see After Visit Summary for patient specific instructions.  Future Appointments  Date Time Provider [place]  07/28/2018  9:00 AM [facility] PET CT 1 [facility] [place]  08/01/2018 10:00 AM [facility] LAB 6 [facility] None  08/01/2018 10:40 AM [name], MD [facility] None  08/01/2018 12:00 PM [facility] INFUSION [facility] None    No orders of the defined types were placed in this encounter.      Subjective:   Patient ID:  TAEVIN MCFERRAN is a 62 y.o. (DOB Jul 29, 1956) male.  Chief Complaint: No chief complaint on file.   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 He had a CT scan completed on 03/10/2018 which showed a large irregular infiltrative solid mass in the right lower quadrant measuring approximately 18.7 x 18.5 x 17.8 cm. This mass was infiltrating and encasing multiple distal small bowel loops and likely the ileocecal region. It was partially encasing the sigmoid colon with prominent extension into the right lower retroperitoneum and extraperitoneal right pelvis with encasement of the right external iliac and proximal right common iliac vasculature with infiltration of the right iliopsoas muscle. 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 Truitt Merle but then transition care to Dr. Sullivan Lone given his new diagnosis. He was most recently treated with cycle 6 of EPOCH-Rchemotherapy on 07/03/2018.   He presents to the clinic today having last been seen on 07/20/2016 for IV fluids and a follow up KUB which showed a partial bowel obstruction.  Interval nodule and mucosal  patterns involving the transverse, distal descending and proximal  sigmoid colon were noted.  This was felt to be possibly due to infectious or inflammatory colitis.  He continues on Flagyl.    He had diarrhea today.  He had diarrhea several times yesterday and had one semi-formed stool.  He continues to have burping, abdominal distention, rumbling of his stomach, and left-sided abdominal pain.  He has been drinking clear liquids today.  He attempted to eat more solid but low residue foods earlier this week but noted abdominal pain.  He has had some flatulence today but not as much as yesterday.  Medications: I have reviewed the patient's current medications.  Allergies:  Allergies  Allergen Reactions  . Ciprofloxacin Other (See Comments)    Leg tingling    Past Medical History:  Diagnosis Date  . ALLERGIC RHINITIS   . Cancer ([place])    Lymphoma   . Diabetes mellitus   . Hyperlipidemia     Past Surgical History:  Procedure Laterality Date  . BIOPSY  03/15/2018   Procedure: BIOPSY;  Surgeon: Milus Banister, MD;  Location: [facility];  Service: Endoscopy;;  . CLEFT PALATE REPAIR    . COLONOSCOPY N/A 03/15/2018   Procedure: COLONOSCOPY;  Surgeon: Milus Banister, MD;  Location: [facility];  Service: Endoscopy;  Laterality: N/A;  . deviated septum repair     slight improvement  . ESOPHAGOGASTRODUODENOSCOPY N/A 03/15/2018   Procedure: ESOPHAGOGASTRODUODENOSCOPY (EGD);  Surgeon: Milus Banister, MD;  Location: [name];  Service: Endoscopy;  Laterality: N/A;  . IR IMAGING GUIDED PORT INSERTION  03/17/2018  . TONSILLECTOMY      Family History  Problem Relation Age of Onset  . Lung cancer Mother        smoker  . Brain cancer Mother        metastasis  . AAA (abdominal aortic aneurysm) Father        smoker    Social History   Socioeconomic History  . Marital status: Married    Spouse name: lisa  . Number of children: 0  . Years of education: Not on file  . Highest education  level: Not on file  Occupational History  . Not on file  Social Needs  . Financial resource strain: Not hard at all  . Food insecurity:    Worry: Never true    Inability: Never true  . Transportation needs:    Medical: No    Non-medical: No  Tobacco Use  . Smoking status: Never Smoker  . Smokeless tobacco: Never Used  Substance and Sexual Activity  . Alcohol use: Yes    Comment: occasional  . Drug use: No  . Sexual activity: Yes  Lifestyle  . Physical activity:    Days per week: 0 days    Minutes per session: 0 min  . Stress: Not at all  Relationships  . Social connections:    Talks on phone: More than three times a week    Gets together: More than three times a week    Attends religious service: 1 to 4 times per year    Active member of club or organization: No    Attends meetings of clubs or organizations: Never    Relationship status: Married  . Intimate partner violence:    Fear of current or ex partner: No    Emotionally abused: No    Physically abused: No    Forced sexual activity: No  Other Topics Concern  . Not on file  Social History Narrative   Married  1985. No kids. 4 small dogs.       Works in Financial trader, residential      Hobbies: work on cars, Haematologist, exercise as able    Past Medical History, Surgical history, Social history, and Family history were reviewed and updated as appropriate.   Please see review of systems for further details on the patient's review from today.   Review of Systems:  Review of Systems  Constitutional: Positive for activity change and appetite change. Negative for chills, diaphoresis, fatigue and fever.  HENT: Negative for trouble swallowing.   Respiratory: Negative for cough, chest tightness and shortness of breath.   Cardiovascular: Negative for chest pain, palpitations and leg swelling.  Gastrointestinal: Positive for abdominal distention, abdominal pain and diarrhea. Negative for constipation, nausea and  vomiting.       Burping Flatulence decreased from yesterday    Genitourinary: Negative for difficulty urinating.    Objective:   Physical Exam:  BP (!) 152/92 (BP Location: Left Arm, Patient Position: Sitting) Comment: told RN  Pulse (!) 127   Temp 97.7 F (36.5 C) (Oral)   Resp 19   Ht 5' 9"  (1.753 m)   Wt 219 lb (99.3 kg)   SpO2 99%   BMI 32.34 kg/m  ECOG: 0  Physical Exam  Constitutional: No distress.  HENT:  Head: Normocephalic and atraumatic.  Mouth/Throat: Oropharynx is clear and moist.  Cardiovascular: Regular rhythm and normal heart sounds. Tachycardia present. Exam reveals no gallop and no friction rub.  No murmur heard. Pulmonary/Chest: Effort normal and breath sounds normal. No respiratory distress. He has no wheezes. He has no rales.  Abdominal: Soft. He exhibits distension. He exhibits no mass. Bowel sounds are decreased. There is tenderness. There is no rebound and no guarding.  Musculoskeletal: He exhibits no edema.  Neurological: He is alert.  Skin: Skin is warm and dry. He is not diaphoretic.    Lab Review:     Component Value Date/Time   NA 136 07/21/2018 1244   K 3.3 (L) 07/21/2018 1244   CL 95 (L) 07/21/2018 1244   CO2 29 07/21/2018 1244   GLUCOSE 184 (H) 07/21/2018 1244   BUN 23 07/21/2018 1244   CREATININE 0.82 07/21/2018 1244   CREATININE 0.89 07/17/2018 1150   CREATININE 1.26 12/01/2012 1700   CALCIUM 9.0 07/21/2018 1244   PROT 6.4 (L) 07/21/2018 1244   ALBUMIN 3.5 07/21/2018 1244   AST 13 (L) 07/21/2018 1244   AST 13 (L) 07/17/2018 1150   ALT 13 07/21/2018 1244   ALT 15 07/17/2018 1150   ALKPHOS 52 07/21/2018 1244   BILITOT 0.7 07/21/2018 1244   BILITOT 0.3 07/17/2018 1150   GFRNONAA >60 07/21/2018 1244   GFRNONAA >60 07/17/2018 1150   GFRAA >60 07/21/2018 1244   GFRAA >60 07/17/2018 1150       Component Value Date/Time   WBC 6.3 07/21/2018 1244   RBC 3.00 (L) 07/21/2018 1244   HGB 9.3 (L) 07/21/2018 1244   HGB 9.0 (L)  07/17/2018 1150   HCT 30.0 (L) 07/21/2018 1244   PLT 401 (H) 07/21/2018 1244   PLT 200 07/17/2018 1150   MCV 100.0 07/21/2018 1244   MCH 31.0 07/21/2018 1244   MCHC 31.0 07/21/2018 1244   RDW 18.5 (H) 07/21/2018 1244   LYMPHSABS 0.4 (L) 07/21/2018 1244   MONOABS 1.0 07/21/2018 1244   EOSABS 0.0 07/21/2018 1244   BASOSABS 0.0 07/21/2018 1244   -------------------------------  Imaging from last 24 hours (if applicable):  Radiology interpretation: Dg Abdomen 1 View  Result Date: 07/21/2018 CLINICAL DATA:  Bedside nasogastric tube placement. Small bowel obstruction. EXAM: ABDOMEN - 1 VIEW COMPARISON:  CT abdomen and pelvis earlier same day. Abdominal x-rays 07/20/2018, 07/17/2018. FINDINGS: Persistent markedly distended loops of small bowel diffusely throughout the abdomen. The CT pelvis earlier today showed that the transition point is in the pelvis. Gas is present within normal caliber colon, indicating incomplete obstruction. Nasogastric tube tip fundus of the stomach. Contrast material in the urinary bladder related to the IV contrast administration earlier today. IMPRESSION: 1. Nasogastric tube tip in the fundus the stomach. 2. High-grade partial small bowel obstruction as demonstrated on the CT earlier today, worse than on yesterday's KUB. Electronically Signed   By: Evangeline Dakin M.D.   On: 07/21/2018 17:01   Dg Abd 1 View  Result Date: 07/20/2018 CLINICAL DATA:  Abdominal distention and excess gas. EXAM: ABDOMEN - 1 VIEW COMPARISON:  07/17/2018. FINDINGS: Interval dilated small bowel loops. There is also an interval nodular mucosal pattern involving the filled components of the transverse, distal descending and proximal sigmoid colon. No gross free peritoneal air. Lower thoracic and mild lumbar spine degenerative changes. IMPRESSION: 1. Interval partial small bowel obstruction. 2. Interval nodular mucosal pattern involving the transverse, distal descending and proximal sigmoid colon.  This could be due to infectious or inflammatory colitis. Electronically Signed   By: Claudie Revering M.D.   On: 07/20/2018 12:06   Dg Abd 1 View  Result Date: 07/17/2018 CLINICAL DATA:  Left lower quadrant abdominal pain. EXAM: ABDOMEN - 1 VIEW COMPARISON:  06/09/2018 FINDINGS: Moderate amount of intestinal gas which could be due to ileus or partial obstruction. Right renal stone again seen. No acute or significant bone finding. IMPRESSION: Moderate amount of intestinal gas within small and large bowel that could be due to ileus or partial obstruction. Relative paucity of gas projects over the pelvis. Right renal stone, chronic. Electronically Signed   By: Nelson Chimes M.D.   On: 07/17/2018 13:26   Ct Pelvis W Contrast  Result Date: 07/21/2018 CLINICAL DATA:  62 year old with current history of B-cell lymphoma, with evidence of partial small bowel obstruction on abdominal x-rays yesterday. CT abdomen was performed earlier today demonstrating a high-grade small-bowel obstruction, though the transition point was not visible on that examination. EXAM: CT PELVIS WITH CONTRAST (10:40 a.m.: TECHNIQUE: Multidetector CT imaging of the pelvis was performed using the standard protocol following the bolus administration of intravenous contrast. CONTRAST:  One hundred ISOVUE-300 IOPAMIDOL INJECTION 61% IV and oral contrast which were administered at the time of the CT abdomen performed earlier same day at 8:47 a.m. COMPARISON:  CT abdomen and pelvis performed earlier same day. (These examinations will be combined into a single CT abdomen and pelvis order). PET-CT 06/09/2018, 04/18/2018. FINDINGS: Urinary Tract: See report of CT abdomen performed earlier same day as the kidneys are not included on the current examination. Urinary bladder decompressed and normal in appearance, containing contrast. Contrast within normal caliber distal LEFT ureter. Bowel: Dilated loops of small bowel throughout the pelvis with a transition to  normal caliber distal and terminal ileum approximately 6 cm proximal to the ileocecal valve. A small amount of the previously administered oral contrast material is present in the normal caliber distal ileum and in the cecum and ascending colon, indicating incomplete obstruction. The transition point is at the site of previously treated lymphoma where there is scar and post treatment change. Scattered sigmoid colon diverticula are present. Liquid  stool is present within normal caliber visualized colon. Vascular/Lymphatic: Scar/post treatment change in the pelvis at the site of the previously treated lymphoma in the pelvis. No residual mass in the ANTERIOR pelvis at the site of the FDG avid lymphoma on the 06/09/2018 PET/CT. No pathologic lymphadenopathy in the pelvis currently. No visible aorto-iliofemoral atherosclerosis. Reproductive: Calcifications within normal sized prostate gland. Normal appearing seminal vesicles. Other:  None. Musculoskeletal: Degenerative changes involving the sacroiliac joints with partial ankylosis of the RIGHT SI joint. 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 Facet degenerative changes at L5-S1. No acute findings. IMPRESSION: 1. The transition point for the small-bowel obstruction is in the pelvis and appears to be approximately 6 cm proximal to the ileocecal valve. The obstruction is felt to be secondary to scar/post treatment change in the pelvis at the site of the previous lymphoma. 2. The FDG avid mass in the ANTERIOR pelvis on the recent PET-CT 06/09/2018 has resolved. No pathologic lymphadenopathy in the pelvis currently. Electronically Signed   By: Evangeline Dakin M.D.   On: 07/21/2018 11:28   Ct Abdomen W Contrast  Result Date: 07/21/2018 CLINICAL DATA:  Left-sided abdominal pain and bloating. Nausea vomiting diarrhea. History of lymphoma. EXAM: CT ABDOMEN WITH CONTRAST TECHNIQUE: Multidetector CT imaging of the  abdomen was performed using the standard protocol following bolus administration of intravenous contrast. CONTRAST:  198m OMNIPAQUE IOHEXOL 300 MG/ML  SOLN COMPARISON:  PET-CT from 06/09/2018 FINDINGS: Lower chest: Subsegmental atelectasis noted within the lung bases no pleural effusion or airspace densities. Hepatobiliary: Tiny low-density structure within the dome of liver measures 4 mm and is too small to characterize. Unchanged. There is relative hypertrophy of the lateral segment of left lobe of liver. No contour abnormality identified. Gallbladder appears normal. No biliary dilatation. Pancreas: Pancreas appears normal. Spleen: Spleen is unremarkable. Adrenals/Urinary Tract: Normal adrenal glands. Small bilateral low-density kidney lesions are noted, these are less than 1 cm and too small to reliably characterize. Right renal calculus measures 6 mm. No hydronephrosis noted bilaterally. Stomach/Bowel: Stomach appears normal. There is abnormal increase caliber of the visualized small bowel loops which measure up to 4.4 cm. There is also diffuse distension of the colon. Small and large bowel air-fluid levels are identified. The pelvic bowel loops are not included on this exam. Vascular/Lymphatic: Mild aortic atherosclerosis. No aneurysm. No abdominopelvic adenopathy identified. Other: There is trace fluid identified along the pericolic gutters. No pneumoperitoneum. No focal fluid collections identified. Musculoskeletal: Mild spondylosis within the thoracic and lumbar spine. IMPRESSION: 1. Imaging findings concerning for high-grade bowel obstruction. As this exam is of the abdomen only the bowel loops are incompletely visualized, and therefore the transition point (etiology of the obstruction)is indeterminate. Findings may reflect a high-grade partial small bowel obstruction or distal large bowel obstruction. Metabolically active tumor within the pelvis was identified on PET scan from 06/09/2018 and could  potentially reflect a site of obstruction. Consider further evaluation with complete CT of the abdomen pelvis with contrast material for more thorough assessment of the bowel loops. 2. Nonobstructing right renal calculus. Electronically Signed   By: TKerby MoorsM.D.   On: 07/21/2018 09:30        This patient was seen with Dr. FBurr Medicowith my treatment plan reviewed with her. She expressed agreement with my medical management of this patient.  Addendum  I have seen the patient, examined him. I agree with the assessment and and plan and have edited the notes.  Mr Cominsky has developed acute bowel obstruction.  I reviewed his CT scan from this morning, including the CT of pelvis which was later added on, with patient and his wife in details.  There is no significant residual or recurrent lymphoma on the CT scan, his bowel obstruction is likely related to adhesion or scar tissues. He is quite symptomatic, but clinical stable, I recommend hospital admission for further management. He agrees with the plan.  Truitt Merle  07/21/2018

## 2018-07-21 NOTE — Progress Notes (Signed)
Patient refuses heparin drip due to his past experience with bleeding. Patient made aware of the reason for  Heparin drip  also by MD on call via phone. Patient insist on not getting the heparin drip.

## 2018-07-21 NOTE — Progress Notes (Signed)
In Progress  07/21/18 8:23 PM [place], Vida Roller, RN  IV RN consulted to start IV on patient who has a port-a-cath and is to received IV heparin gtt. This IV RN attempted to start IV x 1. Patient refused any further attempts as he had concerns about the heparin gtt. This information was passed along to the the patient's primary RN.

## 2018-07-22 DIAGNOSIS — E1169 Type 2 diabetes mellitus with other specified complication: Secondary | ICD-10-CM

## 2018-07-22 DIAGNOSIS — C8370 Burkitt lymphoma, unspecified site: Secondary | ICD-10-CM

## 2018-07-22 DIAGNOSIS — D6481 Anemia due to antineoplastic chemotherapy: Secondary | ICD-10-CM

## 2018-07-22 DIAGNOSIS — Z86718 Personal history of other venous thrombosis and embolism: Secondary | ICD-10-CM

## 2018-07-22 DIAGNOSIS — E876 Hypokalemia: Secondary | ICD-10-CM

## 2018-07-22 DIAGNOSIS — T451X5A Adverse effect of antineoplastic and immunosuppressive drugs, initial encounter: Secondary | ICD-10-CM

## 2018-07-22 DIAGNOSIS — Z7901 Long term (current) use of anticoagulants: Secondary | ICD-10-CM

## 2018-07-22 DIAGNOSIS — K56609 Unspecified intestinal obstruction, unspecified as to partial versus complete obstruction: Secondary | ICD-10-CM

## 2018-07-22 LAB — CBC
HCT: 26.3 % — ABNORMAL LOW (ref 39.0–52.0)
Hemoglobin: 8 g/dL — ABNORMAL LOW (ref 13.0–17.0)
MCH: 31.1 pg (ref 26.0–34.0)
MCHC: 30.4 g/dL (ref 30.0–36.0)
MCV: 102.3 fL — AB (ref 80.0–100.0)
NRBC: 0 % (ref 0.0–0.2)
PLATELETS: 355 10*3/uL (ref 150–400)
RBC: 2.57 MIL/uL — ABNORMAL LOW (ref 4.22–5.81)
RDW: 18.8 % — AB (ref 11.5–15.5)
WBC: 5.5 10*3/uL (ref 4.0–10.5)

## 2018-07-22 LAB — BASIC METABOLIC PANEL
ANION GAP: 8 (ref 5–15)
BUN: 17 mg/dL (ref 8–23)
CALCIUM: 8.7 mg/dL — AB (ref 8.9–10.3)
CO2: 29 mmol/L (ref 22–32)
Chloride: 100 mmol/L (ref 98–111)
Creatinine, Ser: 0.68 mg/dL (ref 0.61–1.24)
GLUCOSE: 134 mg/dL — AB (ref 70–99)
Potassium: 3.4 mmol/L — ABNORMAL LOW (ref 3.5–5.1)
Sodium: 137 mmol/L (ref 135–145)

## 2018-07-22 LAB — GASTROINTESTINAL PANEL BY PCR, STOOL (REPLACES STOOL CULTURE)
Adenovirus F40/41: NOT DETECTED
Astrovirus: NOT DETECTED
Campylobacter species: NOT DETECTED
Cryptosporidium: NOT DETECTED
Cyclospora cayetanensis: NOT DETECTED
ENTAMOEBA HISTOLYTICA: NOT DETECTED
Enteroaggregative E coli (EAEC): NOT DETECTED
Enteropathogenic E coli (EPEC): NOT DETECTED
Enterotoxigenic E coli (ETEC): NOT DETECTED
GIARDIA LAMBLIA: NOT DETECTED
NOROVIRUS GI/GII: NOT DETECTED
PLESIMONAS SHIGELLOIDES: NOT DETECTED
ROTAVIRUS A: NOT DETECTED
SALMONELLA SPECIES: NOT DETECTED
SHIGA LIKE TOXIN PRODUCING E COLI (STEC): NOT DETECTED
SHIGELLA/ENTEROINVASIVE E COLI (EIEC): NOT DETECTED
Sapovirus (I, II, IV, and V): NOT DETECTED
VIBRIO SPECIES: NOT DETECTED
Vibrio cholerae: NOT DETECTED
YERSINIA ENTEROCOLITICA: NOT DETECTED

## 2018-07-22 MED ORDER — FAMOTIDINE IN NACL 20-0.9 MG/50ML-% IV SOLN
20.0000 mg | INTRAVENOUS | Status: DC
  Administered 2018-07-22: 20 mg via INTRAVENOUS
  Filled 2018-07-22 (×2): qty 50

## 2018-07-22 MED ORDER — POTASSIUM CHLORIDE 10 MEQ/100ML IV SOLN
10.0000 meq | INTRAVENOUS | Status: AC
  Administered 2018-07-22 (×4): 10 meq via INTRAVENOUS
  Filled 2018-07-22 (×3): qty 100

## 2018-07-22 NOTE — Progress Notes (Signed)
NGT was clamped per order at 10 am. Order is to clamp x6 hours. Pt has tolerated well. Pt denied N/V/D, pain and or discomfort. Will have RN reassess at 4 pm.

## 2018-07-22 NOTE — Progress Notes (Signed)
Subjective/Chief Complaint: diarrhea Pt with multiple BM    Objective: Vital signs in last 24 hours: Temp:  [97.7 F (36.5 C)-99 F (37.2 C)] 98.1 F (36.7 C) (11/09 0525) Pulse Rate:  [97-129] 97 (11/09 0525) Resp:  [17-22] 18 (11/09 0525) BP: (124-152)/(77-103) 131/86 (11/09 0525) SpO2:  [94 %-99 %] 94 % (11/09 0525) Weight:  [99.3 kg] 99.3 kg (11/08 1215) Last BM Date: 07/21/18  Intake/Output from previous day: 11/08 0701 - 11/09 0700 In: 1010.5 [I.V.:913.2; IV Piggyback:97.3] Out: 1400 [Urine:100; Emesis/NG output:1300] Intake/Output this shift: Total I/O In: -  Out: 300 [Urine:300]  General appearance: alert and cooperative Resp: clear to auscultation bilaterally Cardio: regular rate and rhythm, S1, S2 normal, no murmur, click, rub or gallop GI: mild distention minimal tenderness  BS present     Lab Results:  Recent Labs    07/21/18 1244 07/22/18 0353  WBC 6.3 5.5  HGB 9.3* 8.0*  HCT 30.0* 26.3*  PLT 401* 355   BMET Recent Labs    07/21/18 1244 07/22/18 0353  NA 136 137  K 3.3* 3.4*  CL 95* 100  CO2 29 29  GLUCOSE 184* 134*  BUN 23 17  CREATININE 0.82 0.68  CALCIUM 9.0 8.7*   PT/INR No results for input(s): LABPROT, INR in the last 72 hours. ABG No results for input(s): PHART, HCO3 in the last 72 hours.  Invalid input(s): PCO2, PO2  Studies/Results: Dg Abdomen 1 View  Result Date: 07/21/2018 CLINICAL DATA:  Bedside nasogastric tube placement. Small bowel obstruction. EXAM: ABDOMEN - 1 VIEW COMPARISON:  CT abdomen and pelvis earlier same day. Abdominal x-rays 07/20/2018, 07/17/2018. FINDINGS: Persistent markedly distended loops of small bowel diffusely throughout the abdomen. The CT pelvis earlier today showed that the transition point is in the pelvis. Gas is present within normal caliber colon, indicating incomplete obstruction. Nasogastric tube tip fundus of the stomach. Contrast material in the urinary bladder related to the IV contrast  administration earlier today. IMPRESSION: 1. Nasogastric tube tip in the fundus the stomach. 2. High-grade partial small bowel obstruction as demonstrated on the CT earlier today, worse than on yesterday's KUB. Electronically Signed   By: Evangeline Dakin M.D.   On: 07/21/2018 17:01   Dg Abd 1 View  Result Date: 07/20/2018 CLINICAL DATA:  Abdominal distention and excess gas. EXAM: ABDOMEN - 1 VIEW COMPARISON:  07/17/2018. FINDINGS: Interval dilated small bowel loops. There is also an interval nodular mucosal pattern involving the filled components of the transverse, distal descending and proximal sigmoid colon. No gross free peritoneal air. Lower thoracic and mild lumbar spine degenerative changes. IMPRESSION: 1. Interval partial small bowel obstruction. 2. Interval nodular mucosal pattern involving the transverse, distal descending and proximal sigmoid colon. This could be due to infectious or inflammatory colitis. Electronically Signed   By: Claudie Revering M.D.   On: 07/20/2018 12:06   Ct Pelvis W Contrast  Result Date: 07/21/2018 CLINICAL DATA:  62 year old with current history of B-cell lymphoma, with evidence of partial small bowel obstruction on abdominal x-rays yesterday. CT abdomen was performed earlier today demonstrating a high-grade small-bowel obstruction, though the transition point was not visible on that examination. EXAM: CT PELVIS WITH CONTRAST (10:40 a.m.: TECHNIQUE: Multidetector CT imaging of the pelvis was performed using the standard protocol following the bolus administration of intravenous contrast. CONTRAST:  One hundred ISOVUE-300 IOPAMIDOL INJECTION 61% IV and oral contrast which were administered at the time of the CT abdomen performed earlier same day at 8:47 a.m. COMPARISON:  CT abdomen and pelvis performed earlier same day. (These examinations will be combined into a single CT abdomen and pelvis order). PET-CT 06/09/2018, 04/18/2018. FINDINGS: Urinary Tract: See report of CT  abdomen performed earlier same day as the kidneys are not included on the current examination. Urinary bladder decompressed and normal in appearance, containing contrast. Contrast within normal caliber distal LEFT ureter. Bowel: Dilated loops of small bowel throughout the pelvis with a transition to normal caliber distal and terminal ileum approximately 6 cm proximal to the ileocecal valve. A small amount of the previously administered oral contrast material is present in the normal caliber distal ileum and in the cecum and ascending colon, indicating incomplete obstruction. The transition point is at the site of previously treated lymphoma where there is scar and post treatment change. Scattered sigmoid colon diverticula are present. Liquid stool is present within normal caliber visualized colon. Vascular/Lymphatic: Scar/post treatment change in the pelvis at the site of the previously treated lymphoma in the pelvis. No residual mass in the ANTERIOR pelvis at the site of the FDG avid lymphoma on the 06/09/2018 PET/CT. No pathologic lymphadenopathy in the pelvis currently. No visible aorto-iliofemoral atherosclerosis. Reproductive: Calcifications within normal sized prostate gland. Normal appearing seminal vesicles. Other:  None. Musculoskeletal: Degenerative changes involving the sacroiliac joints with partial ankylosis of the RIGHT SI joint. 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 Facet degenerative changes at L5-S1. No acute findings. IMPRESSION: 1. The transition point for the small-bowel obstruction is in the pelvis and appears to be approximately 6 cm proximal to the ileocecal valve. The obstruction is felt to be secondary to scar/post treatment change in the pelvis at the site of the previous lymphoma. 2. The FDG avid mass in the ANTERIOR pelvis on the recent PET-CT 06/09/2018 has resolved. No pathologic lymphadenopathy in the pelvis currently.  Electronically Signed   By: Evangeline Dakin M.D.   On: 07/21/2018 11:28   Ct Abdomen W Contrast  Result Date: 07/21/2018 CLINICAL DATA:  Left-sided abdominal pain and bloating. Nausea vomiting diarrhea. History of lymphoma. EXAM: CT ABDOMEN WITH CONTRAST TECHNIQUE: Multidetector CT imaging of the abdomen was performed using the standard protocol following bolus administration of intravenous contrast. CONTRAST:  199m OMNIPAQUE IOHEXOL 300 MG/ML  SOLN COMPARISON:  PET-CT from 06/09/2018 FINDINGS: Lower chest: Subsegmental atelectasis noted within the lung bases no pleural effusion or airspace densities. Hepatobiliary: Tiny low-density structure within the dome of liver measures 4 mm and is too small to characterize. Unchanged. There is relative hypertrophy of the lateral segment of left lobe of liver. No contour abnormality identified. Gallbladder appears normal. No biliary dilatation. Pancreas: Pancreas appears normal. Spleen: Spleen is unremarkable. Adrenals/Urinary Tract: Normal adrenal glands. Small bilateral low-density kidney lesions are noted, these are less than 1 cm and too small to reliably characterize. Right renal calculus measures 6 mm. No hydronephrosis noted bilaterally. Stomach/Bowel: Stomach appears normal. There is abnormal increase caliber of the visualized small bowel loops which measure up to 4.4 cm. There is also diffuse distension of the colon. Small and large bowel air-fluid levels are identified. The pelvic bowel loops are not included on this exam. Vascular/Lymphatic: Mild aortic atherosclerosis. No aneurysm. No abdominopelvic adenopathy identified. Other: There is trace fluid identified along the pericolic gutters. No pneumoperitoneum. No focal fluid collections identified. Musculoskeletal: Mild spondylosis within the thoracic and lumbar spine. IMPRESSION: 1. Imaging findings concerning for high-grade bowel obstruction. As this exam is of the abdomen only the bowel  loops are  incompletely visualized, and therefore the transition point (etiology of the obstruction)is indeterminate. Findings may reflect a high-grade partial small bowel obstruction or distal large bowel obstruction. Metabolically active tumor within the pelvis was identified on PET scan from 06/09/2018 and could potentially reflect a site of obstruction. Consider further evaluation with complete CT of the abdomen pelvis with contrast material for more thorough assessment of the bowel loops. 2. Nonobstructing right renal calculus. Electronically Signed   By: Kerby Moors M.D.   On: 07/21/2018 09:30    Anti-infectives: Anti-infectives (From admission, onward)   None      Assessment/Plan:  T2DM - SSI BMI 32.34 Hx of BL PE and RLE DVT - on eliquis, hold while npo and start heparin  Burkitt's lymphoma - has finished chemotherapy  SBO - CT: sbo with transition point in the pelvis ~6 cm proximal to ileocecal valve - NGT inserted in ED with return of bilious appearing drainage  Clamp today  Hopefully remove later and advance diet  - SB protocol  - hopefully this will resolve with conservative management, if not patient may require exploratory surgery  FEN: NPO, IVF; NGT to LIWS VTE: SCDs, hold eilquis, heparin gtt per pharmacy ID: no abx indicated   LOS: 1 day    Marcello Moores A Tressy Kunzman 07/22/2018

## 2018-07-22 NOTE — Progress Notes (Signed)
PROGRESS NOTE    Mark Frey  OBS:[id number] DOB: [birth year] DOA: 07/21/2018 PCP: [name], MD    Brief Narrative:  62 year old male who presented with diarrhea and abdominal pain.  He does have the significant past medical history for Burkitt's lymphoma, and type 2 diabetes mellitus, who presented from the cancer center after a CT of the abdomen and pelvis showing high-grade small bowel obstruction.  Patient reported left lower quadrant and periumbilical abdominal pain for the last 2 days prior to hospitalization, associated with diarrhea and poor oral intake.  Outpatient work-up included CT of the abdomen and pelvis.  On his initial physical examination blood pressure 124/103, heart rate 121, respiratory rate 22, temperature 98.3, oxygen saturation 96%, moist mucous membranes, lungs clear to auscultation bilaterally, heart S1-S2 present, tachycardic, no gallops rubs or murmurs, abdomen distended, generalized tenderness, decreased bowel sounds, no lower extremity edema.  Sodium 136, potassium 3.3, chloride 95, bicarb 29, glucose 184, BUN 23, creatinine 0.82, magnesium 1.8, AST 13, ALT 13, total bilirubin is 0.7, white count 6.3, hemoglobin 9.3, hematocrit 30.0, platelets 401, urinalysis negative for infection, specific gravity > 1.046.  Stool C. difficile negative.  CT of the abdomen  with high-grade partial small bowel obstruction or distal large bowel obstruction.  CT of the pelvis with transition point of the small bowel obstruction in the pelvic region, 6 cm proximal to the ileocecal valve.  EKG sinus rhythm, normal axis, normal intervals.  Patient was admitted to the hospital with working diagnosis of partial small bowel obstruction.   Assessment & Plan:   Principal Problem:   SBO (small bowel obstruction) (HCC) Active Problems:   Bilateral pulmonary embolism (HCC)   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HCC)   Diabetes mellitus (HCC)   Burkitt lymphoma, lymph nodes of multiple sites  (HCC)   Hypokalemia   Diarrhea  1. Partial small bowel obstruction. Patient had bowel movement and positive flatus, NG has been disconnected from suction, no further nausea or vomiting, will continue supportive medical therapy with IV fluids, antiacids, as needed analgesics and antiemetics. Follow with surgery recommendations. Keep patient nothing by mouth for now.   2. Bilateral pulmonary embolism. Diagnosed in July, malignancy related, will continue anticoagulation with heparin while NPO. Blood pressure has remained stable. No chest pain.   3. T2DM. Will continue glucose cover and monitoring with insulin sliding scale.   4. Hypokalemia. Will continue hydration with isotonic saline and will add Kcl IV, 40 meq, will follow on renal panel in am.   5. History of Burkitt lymphoma/ High grade B cell. Continue follow up with Oncology. Wbc at 5,5 with hb at 8,0 and 355 platelets. No indication for blood products transfusion.    DVT prophylaxis: heparin   Code Status:  full Family Communication: I spoke with patient's wife at the bedside and all questions were addressed.  Disposition Plan/ discharge barriers: pending clinical improvement  Body mass index is 32.34 kg/m. Malnutrition Type:      Malnutrition Characteristics:      Nutrition Interventions:     RN Pressure Injury Documentation:     Consultants:   Surgery   Oncology   Procedures:     Antimicrobials:       Subjective: Patient with NG tube in place, feeling better, no further nausea or vomiting, reported positive bowel movement and flatus.   Objective: Vitals:   07/21/18 1330 07/21/18 1737 07/21/18 2127 07/22/18 0525  BP: (!) 147/86 130/90 135/90 131/86  Pulse: (!) 109 (!) 129 [name])  106 97  Resp: (!) 21 18 18 18   Temp:   99 F (37.2 C) 98.1 F (36.7 C)  TempSrc:   Oral Oral  SpO2: 96% 99% 96% 94%  Weight:      Height:        Intake/Output Summary (Last 24 hours) at 07/22/2018 1133 Last data  filed at 07/22/2018 0730 Gross per 24 hour  Intake 1010.48 ml  Output 1700 ml  Net -689.52 ml   Filed Weights   07/21/18 1215  Weight: 99.3 kg    Examination:   General: Not in pain or dyspnea, deconditioned  Neurology: Awake and alert, non focal  E ENT: mid pallor, no icterus, oral mucosa moist Cardiovascular: No JVD. S1-S2 present, rhythmic, no gallops, rubs, or murmurs. No lower extremity edema. Pulmonary: positive breath sounds bilaterally, adequate air movement, no wheezing, rhonchi or rales. Gastrointestinal. Abdomen distended, non tender, positive bowel sounds, tympanic to percussion, no organomegaly, non tender, no rebound or guarding Skin. No rashes Musculoskeletal: no joint deformities     Data Reviewed: I have personally reviewed following labs and imaging studies  CBC: Recent Labs  Lab 07/17/18 1150 07/21/18 1244 07/22/18 0353  WBC 11.5* 6.3 5.5  NEUTROABS 7.9* 4.8  --   HGB 9.0* 9.3* 8.0*  HCT 29.1* 30.0* 26.3*  MCV 98.6 100.0 102.3*  PLT 200 401* 979   Basic Metabolic Panel: Recent Labs  Lab 07/17/18 1150 07/21/18 1244 07/22/18 0353  NA 138 136 137  K 4.0 3.3* 3.4*  CL 103 95* 100  CO2 24 29 29   GLUCOSE 146* 184* 134*  BUN 10 23 17   CREATININE 0.89 0.82 0.68  CALCIUM 10.0 9.0 8.7*  MG  --  1.8  --    GFR: Estimated Creatinine Clearance: 111.2 mL/min (by C-G formula based on SCr of 0.68 mg/dL). Liver Function Tests: Recent Labs  Lab 07/17/18 1150 07/21/18 1244  AST 13* 13*  ALT 15 13  ALKPHOS 55 52  BILITOT 0.3 0.7  PROT 6.7 6.4*  ALBUMIN 3.3* 3.5   No results for input(s): LIPASE, AMYLASE in the last 168 hours. No results for input(s): AMMONIA in the last 168 hours. Coagulation Profile: No results for input(s): INR, PROTIME in the last 168 hours. Cardiac Enzymes: No results for input(s): CKTOTAL, CKMB, CKMBINDEX, TROPONINI in the last 168 hours. BNP (last 3 results) No results for input(s): PROBNP in the last 8760  hours. HbA1C: No results for input(s): HGBA1C in the last 72 hours. CBG: Recent Labs  Lab 07/21/18 1737 07/21/18 2125  GLUCAP 136* 138*   Lipid Profile: No results for input(s): CHOL, HDL, LDLCALC, TRIG, CHOLHDL, LDLDIRECT in the last 72 hours. Thyroid Function Tests: No results for input(s): TSH, T4TOTAL, FREET4, T3FREE, THYROIDAB in the last 72 hours. Anemia Panel: No results for input(s): VITAMINB12, FOLATE, FERRITIN, TIBC, IRON, RETICCTPCT in the last 72 hours.    Radiology Studies: I have reviewed all of the imaging during this hospital visit personally     Scheduled Meds: . diatrizoate meglumine-sodium  90 mL Per NG tube Once  . insulin aspart  0-5 Units Subcutaneous QHS  . insulin aspart  0-9 Units Subcutaneous TID WC  . pantoprazole (PROTONIX) IV  40 mg Intravenous Q24H   Continuous Infusions: . sodium chloride 75 mL/hr at 07/22/18 0634  . heparin       LOS: 1 day        Tawni Millers, MD [facility] Pager [phone]

## 2018-07-22 NOTE — Progress Notes (Signed)
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  He completed cycle 6 of EPOCH-R beginning 07/03/2018. He presented to the Cancer center yesterday with abdominal distention, nausea, and diarrhea.  A CT confirmed a small bowel obstruction, felt to be related to scar tissue in the pelvis.  He was admitted and an NG tube was placed.  He reports feeling better today.  He is passing gas, no diarrhea today.  Objective: Vital signs in last 24 hours: Blood pressure 131/86, pulse 97, temperature 98.1 F (36.7 C), temperature source Oral, resp. rate 18, height 5' 9"  (1.753 m), weight 219 lb (99.3 kg), SpO2 94 %.  Intake/Output from previous day: 11/08 0701 - 11/09 0700 In: 1010.5 [I.V.:913.2; IV Piggyback:97.3] Out: 1400 [Urine:100; Emesis/NG output:1300]  Physical Exam:  HEENT: No thrush or ulcers my NG tube in place Lungs: Clear bilaterally Cardiac: Regular rate and rhythm Abdomen: Soft, bowel sounds are present, no hepatosplenomegaly, nontender Extremities: No leg edema   Portacath/PICC-without erythema  Lab Results: Recent Labs    07/21/18 1244 07/22/18 0353  WBC 6.3 5.5  HGB 9.3* 8.0*  HCT 30.0* 26.3*  PLT 401* 355    BMET Recent Labs    07/21/18 1244 07/22/18 0353  NA 136 137  K 3.3* 3.4*  CL 95* 100  CO2 29 29  GLUCOSE 184* 134*  BUN 23 17  CREATININE 0.82 0.68  CALCIUM 9.0 8.7*    No results found for: CEA1  Studies/Results: Dg Abdomen 1 View  Result Date: 07/21/2018 CLINICAL DATA:  Bedside nasogastric tube placement. Small bowel obstruction. EXAM: ABDOMEN - 1 VIEW COMPARISON:  CT abdomen and pelvis earlier same day. Abdominal x-rays 07/20/2018, 07/17/2018. FINDINGS: Persistent markedly distended loops of small bowel diffusely throughout the abdomen. The CT pelvis earlier today showed that the transition point is in the pelvis. Gas is present within normal caliber colon, indicating incomplete obstruction.  Nasogastric tube tip fundus of the stomach. Contrast material in the urinary bladder related to the IV contrast administration earlier today. IMPRESSION: 1. Nasogastric tube tip in the fundus the stomach. 2. High-grade partial small bowel obstruction as demonstrated on the CT earlier today, worse than on yesterday's KUB. Electronically Signed   By: Mark Frey M.D.   On: 07/21/2018 17:01   Dg Abd 1 View  Result Date: 07/20/2018 CLINICAL DATA:  Abdominal distention and excess gas. EXAM: ABDOMEN - 1 VIEW COMPARISON:  07/17/2018. FINDINGS: Interval dilated small bowel loops. There is also an interval nodular mucosal pattern involving the filled components of the transverse, distal descending and proximal sigmoid colon. No gross free peritoneal air. Lower thoracic and mild lumbar spine degenerative changes. IMPRESSION: 1. Interval partial small bowel obstruction. 2. Interval nodular mucosal pattern involving the transverse, distal descending and proximal sigmoid colon. This could be due to infectious or inflammatory colitis. Electronically Signed   By: Mark Frey M.D.   On: 07/20/2018 12:06   Ct Pelvis W Contrast  Result Date: 07/21/2018 CLINICAL DATA:  62 year old with current history of B-cell lymphoma, with evidence of partial small bowel obstruction on abdominal x-rays yesterday. CT abdomen was performed earlier today demonstrating a high-grade small-bowel obstruction, though the transition point was not visible on that examination. EXAM: CT PELVIS WITH CONTRAST (10:40 a.m.: TECHNIQUE: Multidetector CT imaging of the pelvis was performed using the standard protocol following the bolus administration of intravenous contrast. CONTRAST:  One hundred ISOVUE-300 IOPAMIDOL INJECTION 61% IV and oral contrast which were  administered at the time of the CT abdomen performed earlier same day at 8:47 a.m. COMPARISON:  CT abdomen and pelvis performed earlier same day. (These examinations will be combined into a  single CT abdomen and pelvis order). PET-CT 06/09/2018, 04/18/2018. FINDINGS: Urinary Tract: See report of CT abdomen performed earlier same day as the kidneys are not included on the current examination. Urinary bladder decompressed and normal in appearance, containing contrast. Contrast within normal caliber distal LEFT ureter. Bowel: Dilated loops of small bowel throughout the pelvis with a transition to normal caliber distal and terminal ileum approximately 6 cm proximal to the ileocecal valve. A small amount of the previously administered oral contrast material is present in the normal caliber distal ileum and in the cecum and ascending colon, indicating incomplete obstruction. The transition point is at the site of previously treated lymphoma where there is scar and post treatment change. Scattered sigmoid colon diverticula are present. Liquid stool is present within normal caliber visualized colon. Vascular/Lymphatic: Scar/post treatment change in the pelvis at the site of the previously treated lymphoma in the pelvis. No residual mass in the ANTERIOR pelvis at the site of the FDG avid lymphoma on the 06/09/2018 PET/CT. No pathologic lymphadenopathy in the pelvis currently. No visible aorto-iliofemoral atherosclerosis. Reproductive: Calcifications within normal sized prostate gland. Normal appearing seminal vesicles. Other:  None. Musculoskeletal: Degenerative changes involving the sacroiliac joints with partial ankylosis of the RIGHT SI joint. 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 Facet degenerative changes at L5-S1. No acute findings. IMPRESSION: 1. The transition point for the small-bowel obstruction is in the pelvis and appears to be approximately 6 cm proximal to the ileocecal valve. The obstruction is felt to be secondary to scar/post treatment change in the pelvis at the site of the previous lymphoma. 2. The FDG avid mass in the ANTERIOR  pelvis on the recent PET-CT 06/09/2018 has resolved. No pathologic lymphadenopathy in the pelvis currently. Electronically Signed   By: Mark Frey M.D.   On: 07/21/2018 11:28   Ct Abdomen W Contrast  Result Date: 07/21/2018 CLINICAL DATA:  Left-sided abdominal pain and bloating. Nausea vomiting diarrhea. History of lymphoma. EXAM: CT ABDOMEN WITH CONTRAST TECHNIQUE: Multidetector CT imaging of the abdomen was performed using the standard protocol following bolus administration of intravenous contrast. CONTRAST:  123m OMNIPAQUE IOHEXOL 300 MG/ML  SOLN COMPARISON:  PET-CT from 06/09/2018 FINDINGS: Lower chest: Subsegmental atelectasis noted within the lung bases no pleural effusion or airspace densities. Hepatobiliary: Tiny low-density structure within the dome of liver measures 4 mm and is too small to characterize. Unchanged. There is relative hypertrophy of the lateral segment of left lobe of liver. No contour abnormality identified. Gallbladder appears normal. No biliary dilatation. Pancreas: Pancreas appears normal. Spleen: Spleen is unremarkable. Adrenals/Urinary Tract: Normal adrenal glands. Small bilateral low-density kidney lesions are noted, these are less than 1 cm and too small to reliably characterize. Right renal calculus measures 6 mm. No hydronephrosis noted bilaterally. Stomach/Bowel: Stomach appears normal. There is abnormal increase caliber of the visualized small bowel loops which measure up to 4.4 cm. There is also diffuse distension of the colon. Small and large bowel air-fluid levels are identified. The pelvic bowel loops are not included on this exam. Vascular/Lymphatic: Mild aortic atherosclerosis. No aneurysm. No abdominopelvic adenopathy identified. Other: There is trace fluid identified along the pericolic gutters. No pneumoperitoneum. No focal fluid collections identified. Musculoskeletal: Mild spondylosis within the thoracic and lumbar spine. IMPRESSION: 1.  Imaging findings  concerning for high-grade bowel obstruction. As this exam is of the abdomen only the bowel loops are incompletely visualized, and therefore the transition point (etiology of the obstruction)is indeterminate. Findings may reflect a high-grade partial small bowel obstruction or distal large bowel obstruction. Metabolically active tumor within the pelvis was identified on PET scan from 06/09/2018 and could potentially reflect a site of obstruction. Consider further evaluation with complete CT of the abdomen pelvis with contrast material for more thorough assessment of the bowel loops. 2. Nonobstructing right renal calculus. Electronically Signed   By: Kerby Moors M.D.   On: 07/21/2018 09:30    Medications: I have reviewed the patient's current medications.  Assessment/Plan: 1.  Burkitt's lymphoma, in clinical remission following treatment with EPOCH-R, status post cycle 6 beginning 07/03/2018 2.  Small bowel obstruction-CT 07/21/2018 assistant with a distal small bowel obstruction and an area of treated lymphoma, no residual mass in the anterior pelvis, no pelvic lymphadenopathy 3.  Anemia secondary to chemotherapy 4.  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 Calleros is admitted with a small bowel obstruction, likely related to scar tissue in an area of treated lymphoma.  An NG tube is in place and he is being followed by the surgical service.  Recommendations: 1.  Management of small bowel obstruction per the surgical service 2.  Continue heparin anticoagulation, resume Eliquis if the small bowel obstruction resolves  Please call Oncology as needed on 07/23/2018.  I will see him 07/24/2018   LOS: 1 day   Betsy Coder, MD   07/22/2018, 8:01 AM

## 2018-07-23 ENCOUNTER — Inpatient Hospital Stay (HOSPITAL_COMMUNITY): Payer: BLUE CROSS/BLUE SHIELD

## 2018-07-23 DIAGNOSIS — I2699 Other pulmonary embolism without acute cor pulmonale: Secondary | ICD-10-CM

## 2018-07-23 DIAGNOSIS — C8378 Burkitt lymphoma, lymph nodes of multiple sites: Secondary | ICD-10-CM

## 2018-07-23 LAB — BASIC METABOLIC PANEL
Anion gap: 8 (ref 5–15)
BUN: 13 mg/dL (ref 8–23)
CALCIUM: 8.2 mg/dL — AB (ref 8.9–10.3)
CO2: 23 mmol/L (ref 22–32)
CREATININE: 0.59 mg/dL — AB (ref 0.61–1.24)
Chloride: 104 mmol/L (ref 98–111)
GFR calc non Af Amer: >60 mL/min (ref >60)
Glucose, Bld: 103 mg/dL — ABNORMAL HIGH (ref 70–99)
Potassium: 3.2 mmol/L — ABNORMAL LOW (ref 3.5–5.1)
SODIUM: 135 mmol/L (ref 135–145)

## 2018-07-23 LAB — CBC
HCT: 26.3 % — ABNORMAL LOW (ref 39.0–52.0)
HEMOGLOBIN: 7.8 g/dL — AB (ref 13.0–17.0)
MCH: 30.4 pg (ref 26.0–34.0)
MCHC: 29.7 g/dL — AB (ref 30.0–36.0)
MCV: 102.3 fL — ABNORMAL HIGH (ref 80.0–100.0)
PLATELETS: 427 10*3/uL — AB (ref 150–400)
RBC: 2.57 MIL/uL — ABNORMAL LOW (ref 4.22–5.81)
RDW: 18.6 % — ABNORMAL HIGH (ref 11.5–15.5)
WBC: 7 10*3/uL (ref 4.0–10.5)
nRBC: 0 % (ref 0.0–0.2)

## 2018-07-23 LAB — APTT
APTT: 42 s — AB (ref 24–36)
APTT: 38 s — AB (ref 24–36)
aPTT: 53 seconds — ABNORMAL HIGH (ref 24–36)

## 2018-07-23 LAB — HEPARIN LEVEL (UNFRACTIONATED)
HEPARIN UNFRACTIONATED: 0.81 [IU]/mL — AB (ref 0.30–0.70)
HEPARIN UNFRACTIONATED: 0.48 [IU]/mL (ref 0.30–0.70)
Heparin Unfractionated: 0.47 IU/mL (ref 0.30–0.70)

## 2018-07-23 MED ORDER — POTASSIUM CHLORIDE CRYS ER 20 MEQ PO TBCR
40.0000 meq | EXTENDED_RELEASE_TABLET | ORAL | Status: AC
  Administered 2018-07-23: 40 meq via ORAL
  Filled 2018-07-23 (×2): qty 2

## 2018-07-23 MED ORDER — HEPARIN (PORCINE) 25000 UT/250ML-% IV SOLN: 1850.0000 [IU]/h | INTRAVENOUS | Status: DC

## 2018-07-23 MED ORDER — HEPARIN (PORCINE) 25000 UT/250ML-% IV SOLN
2100.0000 [IU]/h | INTRAVENOUS | Status: DC
  Administered 2018-07-23 (×2): 2000 [IU]/h via INTRAVENOUS
  Filled 2018-07-23: qty 250

## 2018-07-23 MED ORDER — SODIUM CHLORIDE 0.9 % IV SOLN
INTRAVENOUS | Status: DC | PRN
  Administered 2018-07-23: 1000 mL via INTRAVENOUS

## 2018-07-23 MED ORDER — PANTOPRAZOLE SODIUM 40 MG PO TBEC
40.0000 mg | DELAYED_RELEASE_TABLET | Freq: Every day | ORAL | Status: DC
  Administered 2018-07-23: 40 mg via ORAL
  Filled 2018-07-23 (×2): qty 1

## 2018-07-23 NOTE — Progress Notes (Addendum)
ANTICOAGULATION CONSULT NOTE - Follow Up Consult  Pharmacy Consult for heparin Indication: bridge therapy while Eliquis for B PE and DVT on hold for SBO  Allergies  Allergen Reactions  . Ciprofloxacin Other (See Comments)    Leg tingling    Patient Measurements: Height: 5' 9"  (175.3 cm) Weight: 219 lb (99.3 kg) IBW/kg (Calculated) : 70.7 Heparin Dosing Weight: 91.7 kg  Vital Signs: Temp: 98.1 F (36.7 C) (11/10 0535) Temp Source: Oral (11/10 0535) BP: 117/76 (11/10 0535) Pulse Rate: 101 (11/10 0535)  Labs: Recent Labs    07/21/18 1244 07/21/18 1815 07/22/18 0353 07/22/18 2318 07/23/18 0403 07/23/18 1113  HGB 9.3*  --  8.0*  --  7.8*  --   HCT 30.0*  --  26.3*  --  26.3*  --   PLT 401*  --  355  --  427*  --   APTT  --  29  --  42*  --  38*  HEPARINUNFRC  --  1.89*  --  0.81*  --  0.48  CREATININE 0.82  --  0.68  --  0.59*  --     Estimated Creatinine Clearance: 111.2 mL/min (A) (by C-G formula based on SCr of 0.59 mg/dL (L)).   Assessment: 62 yo M with SBO.  Pharmacy consulted to dose heparin while Eliquis on hold for SBO/NPO/NGT.  Pt on Eliquis PTA for hx B PE and RLE DVT.  Pt is s/p chemo for Burkitt's lymphoma. His PTA Eliquis dose is 5 mg po bid with last dose 11/8 at 07am.  Hg 9.3, PLTC 401, SCr WNL.   Today, 07/23/2018  Heparin level 0.48, aPTT 38 (low) on heparin 1650 units/hr - levels still not correlating so will continue to dose heparin based on aPTT  Hgb down 7.8, Plts wnl  No bleeding or problems with IV per discussion with RN  Goal of Therapy:  Heparin level 0.3-0.7 units/ml aPTT 66-102 seconds Monitor platelets by anticoagulation protocol: Yes   Plan:  Increase heparin drip to 1850 units/hr Recheck heparin level and aPTT in 6hrs Daily heparin level and CBC while on heparin  Peggyann Juba, PharmD, BCPS Pager: [phone] 07/23/2018 12:02 PM   Addendum: APTT remains low (53 seconds), heparin level (0.47) still showing some effects of  Eliquis.  Increase heparin to 2000 units/hr and recheck levels in ~8hrs.  Plans noted to transition back to Eliquis tomorrow.  Peggyann Juba, PharmD, BCPS 07/23/2018 7:28 PM

## 2018-07-23 NOTE — Progress Notes (Signed)
PROGRESS NOTE    Mark Frey  XIH:[id number] DOB: 1955/10/07 DOA: 07/21/2018 PCP: [name], MD    Brief Narrative:  62 year old male who presented with diarrhea and abdominal pain.  He does have the significant past medical history for Burkitt's lymphoma, and type 2 diabetes mellitus, who presented from the cancer center after a CT of the abdomen and pelvis showing high-grade small bowel obstruction.  Patient reported left lower quadrant and periumbilical abdominal pain for the last 2 days prior to hospitalization, associated with diarrhea and poor oral intake.  Outpatient work-up included CT of the abdomen and pelvis.  On his initial physical examination blood pressure 124/103, heart rate 121, respiratory rate 22, temperature 98.3, oxygen saturation 96%, moist mucous membranes, lungs clear to auscultation bilaterally, heart S1-S2 present, tachycardic, no gallops rubs or murmurs, abdomen distended, generalized tenderness, decreased bowel sounds, no lower extremity edema.  Sodium 136, potassium 3.3, chloride 95, bicarb 29, glucose 184, BUN 23, creatinine 0.82, magnesium 1.8, AST 13, ALT 13, total bilirubin is 0.7, white count 6.3, hemoglobin 9.3, hematocrit 30.0, platelets 401, urinalysis negative for infection, specific gravity > 1.046.  Stool C. difficile negative.  CT of the abdomen  with high-grade partial small bowel obstruction or distal large bowel obstruction.  CT of the pelvis with transition point of the small bowel obstruction in the pelvic region, 6 cm proximal to the ileocecal valve.  EKG sinus rhythm, normal axis, normal intervals.  Patient was admitted to the hospital with working diagnosis of partial small bowel obstruction.   Assessment & Plan:   Principal Problem:   SBO (small bowel obstruction) (HCC) Active Problems:   Bilateral pulmonary embolism (HCC)   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HCC)   Diabetes mellitus (HCC)   Burkitt lymphoma, lymph nodes of multiple sites  (HCC)   Hypokalemia   Diarrhea  1. Partial small bowel obstruction. NG tube has been removed with good toleration, no nausea or vomiting, positive bowel movement and tolerating po well. Persistent abdominal distention. Change IV famotidine for po pantoprazole.   2. Bilateral pulmonary embolism. Continue heparin IV for now, will plan to resume apixaban in am. No dyspnea or chest pain.   3. T2DM. Glucose cover and monitoring with insulin sliding scale. Fasting glucose this am 103.   4. Hypokalemia. Will continue K correction with Kcl, 40 meq x2, will follow on renal panel in am. Renal function preserved, will discontinue IV fluids.   5. History of Burkitt lymphoma/ High grade B cell. Anemia and thrombocytopenia, malignancy related, hb down to 7,8 with wbc at 7,0. Will continue to follow on cell count in am. No indication for blood products.   DVT prophylaxis: heparin   Code Status:  full Family Communication: I spoke with patient's wife at the bedside and all questions were addressed.  Disposition Plan/ discharge barriers: pending clinical improvement   Body mass index is 32.34 kg/m. Malnutrition Type:      Malnutrition Characteristics:      Nutrition Interventions:     RN Pressure Injury Documentation:     Consultants:   Surgery   Oncology   Procedures:     Antimicrobials:       Subjective: Patient is feeling better, NG tube has been removed, persistent abdominal distention, but positive bowel movement, and flatus. No nausea or vomiting.   Objective: Vitals:   07/22/18 0525 07/22/18 1240 07/22/18 2130 07/23/18 0535  BP: 131/86 120/81 119/81 117/76  Pulse: 97 (!) 101 (!) 111 (!) 101  Resp: 18 12 16 18   Temp: 98.1 F (36.7 C) 98.3 F (36.8 C) 98.7 F (37.1 C) 98.1 F (36.7 C)  TempSrc: Oral Oral Oral Oral  SpO2: 94% 96% 96% 95%  Weight:      Height:        Intake/Output Summary (Last 24 hours) at 07/23/2018 1109 Last data filed at  07/23/2018 0654 Gross per 24 hour  Intake 2334.58 ml  Output 500 ml  Net 1834.58 ml   Filed Weights   07/21/18 1215  Weight: 99.3 kg    Examination:   General: Not in pain or dyspnea  Neurology: Awake and alert, non focal  E ENT: mild pallor, no icterus, oral mucosa moist Cardiovascular: No JVD. S1-S2 present, rhythmic, no gallops, rubs, or murmurs. No lower extremity edema. Pulmonary: positive breath sounds bilaterally, adequate air movement, no wheezing, rhonchi or rales. Gastrointestinal. Abdomen distended and tympanic, no organomegaly, non tender, no rebound or guarding Skin. No rashes Musculoskeletal: no joint deformities     Data Reviewed: I have personally reviewed following labs and imaging studies  CBC: Recent Labs  Lab 07/17/18 1150 07/21/18 1244 07/22/18 0353 07/23/18 0403  WBC 11.5* 6.3 5.5 7.0  NEUTROABS 7.9* 4.8  --   --   HGB 9.0* 9.3* 8.0* 7.8*  HCT 29.1* 30.0* 26.3* 26.3*  MCV 98.6 100.0 102.3* 102.3*  PLT 200 401* 355 144*   Basic Metabolic Panel: Recent Labs  Lab 07/17/18 1150 07/21/18 1244 07/22/18 0353 07/23/18 0403  NA 138 136 137 135  K 4.0 3.3* 3.4* 3.2*  CL 103 95* 100 104  CO2 24 29 29 23   GLUCOSE 146* 184* 134* 103*  BUN 10 23 17 13   CREATININE 0.89 0.82 0.68 0.59*  CALCIUM 10.0 9.0 8.7* 8.2*  MG  --  1.8  --   --    GFR: Estimated Creatinine Clearance: 111.2 mL/min (A) (by C-G formula based on SCr of 0.59 mg/dL (L)). Liver Function Tests: Recent Labs  Lab 07/17/18 1150 07/21/18 1244  AST 13* 13*  ALT 15 13  ALKPHOS 55 52  BILITOT 0.3 0.7  PROT 6.7 6.4*  ALBUMIN 3.3* 3.5   No results for input(s): LIPASE, AMYLASE in the last 168 hours. No results for input(s): AMMONIA in the last 168 hours. Coagulation Profile: No results for input(s): INR, PROTIME in the last 168 hours. Cardiac Enzymes: No results for input(s): CKTOTAL, CKMB, CKMBINDEX, TROPONINI in the last 168 hours. BNP (last 3 results) No results for  input(s): PROBNP in the last 8760 hours. HbA1C: No results for input(s): HGBA1C in the last 72 hours. CBG: Recent Labs  Lab 07/21/18 1737 07/21/18 2125  GLUCAP 136* 138*   Lipid Profile: No results for input(s): CHOL, HDL, LDLCALC, TRIG, CHOLHDL, LDLDIRECT in the last 72 hours. Thyroid Function Tests: No results for input(s): TSH, T4TOTAL, FREET4, T3FREE, THYROIDAB in the last 72 hours. Anemia Panel: No results for input(s): VITAMINB12, FOLATE, FERRITIN, TIBC, IRON, RETICCTPCT in the last 72 hours.    Radiology Studies: I have reviewed all of the imaging during this hospital visit personally     Scheduled Meds: . insulin aspart  0-5 Units Subcutaneous QHS  . insulin aspart  0-9 Units Subcutaneous TID WC   Continuous Infusions: . sodium chloride 75 mL/hr at 07/23/18 1048  . famotidine (PEPCID) IV 20 mg (07/22/18 1634)  . heparin 1,650 Units/hr (07/23/18 0707)     LOS: 2 days        Mauricio Gerome Apley, MD  [facility] Pager [phone]

## 2018-07-23 NOTE — Progress Notes (Signed)
Subjective/Chief Complaint: bloating  Multiple BM overnight  NGT out no nausea or vomiting on clears  Film shows SB dilation and colon gas    Objective: Vital signs in last 24 hours: Temp:  [98.1 F (36.7 C)-98.7 F (37.1 C)] 98.1 F (36.7 C) (11/10 0535) Pulse Rate:  [101-111] 101 (11/10 0535) Resp:  [12-18] 18 (11/10 0535) BP: (117-120)/(76-81) 117/76 (11/10 0535) SpO2:  [95 %-96 %] 95 % (11/10 0535) Last BM Date: 07/21/18  Intake/Output from previous day: 11/09 0701 - 11/10 0700 In: 2334.6 [I.V.:2001.5; IV Piggyback:333.1] Out: 950 [Urine:800; Emesis/NG output:150] Intake/Output this shift: No intake/output data recorded.  General appearance: alert and cooperative Resp: clear to auscultation bilaterally GI: mild distention   Non tender   Lab Results:  Recent Labs    07/22/18 0353 07/23/18 0403  WBC 5.5 7.0  HGB 8.0* 7.8*  HCT 26.3* 26.3*  PLT 355 427*   BMET Recent Labs    07/22/18 0353 07/23/18 0403  NA 137 135  K 3.4* 3.2*  CL 100 104  CO2 29 23  GLUCOSE 134* 103*  BUN 17 13  CREATININE 0.68 0.59*  CALCIUM 8.7* 8.2*   PT/INR No results for input(s): LABPROT, INR in the last 72 hours. ABG No results for input(s): PHART, HCO3 in the last 72 hours.  Invalid input(s): PCO2, PO2  Studies/Results: Dg Abdomen 1 View  Result Date: 07/21/2018 CLINICAL DATA:  Bedside nasogastric tube placement. Small bowel obstruction. EXAM: ABDOMEN - 1 VIEW COMPARISON:  CT abdomen and pelvis earlier same day. Abdominal x-rays 07/20/2018, 07/17/2018. FINDINGS: Persistent markedly distended loops of small bowel diffusely throughout the abdomen. The CT pelvis earlier today showed that the transition point is in the pelvis. Gas is present within normal caliber colon, indicating incomplete obstruction. Nasogastric tube tip fundus of the stomach. Contrast material in the urinary bladder related to the IV contrast administration earlier today. IMPRESSION: 1. Nasogastric tube  tip in the fundus the stomach. 2. High-grade partial small bowel obstruction as demonstrated on the CT earlier today, worse than on yesterday's KUB. Electronically Signed   By: Evangeline Dakin M.D.   On: 07/21/2018 17:01   Ct Pelvis W Contrast  Result Date: 07/21/2018 CLINICAL DATA:  62 year old with current history of B-cell lymphoma, with evidence of partial small bowel obstruction on abdominal x-rays yesterday. CT abdomen was performed earlier today demonstrating a high-grade small-bowel obstruction, though the transition point was not visible on that examination. EXAM: CT PELVIS WITH CONTRAST (10:40 a.m.: TECHNIQUE: Multidetector CT imaging of the pelvis was performed using the standard protocol following the bolus administration of intravenous contrast. CONTRAST:  One hundred ISOVUE-300 IOPAMIDOL INJECTION 61% IV and oral contrast which were administered at the time of the CT abdomen performed earlier same day at 8:47 a.m. COMPARISON:  CT abdomen and pelvis performed earlier same day. (These examinations will be combined into a single CT abdomen and pelvis order). PET-CT 06/09/2018, 04/18/2018. FINDINGS: Urinary Tract: See report of CT abdomen performed earlier same day as the kidneys are not included on the current examination. Urinary bladder decompressed and normal in appearance, containing contrast. Contrast within normal caliber distal LEFT ureter. Bowel: Dilated loops of small bowel throughout the pelvis with a transition to normal caliber distal and terminal ileum approximately 6 cm proximal to the ileocecal valve. A small amount of the previously administered oral contrast material is present in the normal caliber distal ileum and in the cecum and ascending colon, indicating incomplete obstruction. The transition point is  at the site of previously treated lymphoma where there is scar and post treatment change. Scattered sigmoid colon diverticula are present. Liquid stool is present within normal  caliber visualized colon. Vascular/Lymphatic: Scar/post treatment change in the pelvis at the site of the previously treated lymphoma in the pelvis. No residual mass in the ANTERIOR pelvis at the site of the FDG avid lymphoma on the 06/09/2018 PET/CT. No pathologic lymphadenopathy in the pelvis currently. No visible aorto-iliofemoral atherosclerosis. Reproductive: Calcifications within normal sized prostate gland. Normal appearing seminal vesicles. Other:  None. Musculoskeletal: Degenerative changes involving the sacroiliac joints with partial ankylosis of the RIGHT SI joint. 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 Facet degenerative changes at L5-S1. No acute findings. IMPRESSION: 1. The transition point for the small-bowel obstruction is in the pelvis and appears to be approximately 6 cm proximal to the ileocecal valve. The obstruction is felt to be secondary to scar/post treatment change in the pelvis at the site of the previous lymphoma. 2. The FDG avid mass in the ANTERIOR pelvis on the recent PET-CT 06/09/2018 has resolved. No pathologic lymphadenopathy in the pelvis currently. Electronically Signed   By: Evangeline Dakin M.D.   On: 07/21/2018 11:28   Ct Abdomen W Contrast  Result Date: 07/21/2018 CLINICAL DATA:  Left-sided abdominal pain and bloating. Nausea vomiting diarrhea. History of lymphoma. EXAM: CT ABDOMEN WITH CONTRAST TECHNIQUE: Multidetector CT imaging of the abdomen was performed using the standard protocol following bolus administration of intravenous contrast. CONTRAST:  155m OMNIPAQUE IOHEXOL 300 MG/ML  SOLN COMPARISON:  PET-CT from 06/09/2018 FINDINGS: Lower chest: Subsegmental atelectasis noted within the lung bases no pleural effusion or airspace densities. Hepatobiliary: Tiny low-density structure within the dome of liver measures 4 mm and is too small to characterize. Unchanged. There is relative hypertrophy of the lateral  segment of left lobe of liver. No contour abnormality identified. Gallbladder appears normal. No biliary dilatation. Pancreas: Pancreas appears normal. Spleen: Spleen is unremarkable. Adrenals/Urinary Tract: Normal adrenal glands. Small bilateral low-density kidney lesions are noted, these are less than 1 cm and too small to reliably characterize. Right renal calculus measures 6 mm. No hydronephrosis noted bilaterally. Stomach/Bowel: Stomach appears normal. There is abnormal increase caliber of the visualized small bowel loops which measure up to 4.4 cm. There is also diffuse distension of the colon. Small and large bowel air-fluid levels are identified. The pelvic bowel loops are not included on this exam. Vascular/Lymphatic: Mild aortic atherosclerosis. No aneurysm. No abdominopelvic adenopathy identified. Other: There is trace fluid identified along the pericolic gutters. No pneumoperitoneum. No focal fluid collections identified. Musculoskeletal: Mild spondylosis within the thoracic and lumbar spine. IMPRESSION: 1. Imaging findings concerning for high-grade bowel obstruction. As this exam is of the abdomen only the bowel loops are incompletely visualized, and therefore the transition point (etiology of the obstruction)is indeterminate. Findings may reflect a high-grade partial small bowel obstruction or distal large bowel obstruction. Metabolically active tumor within the pelvis was identified on PET scan from 06/09/2018 and could potentially reflect a site of obstruction. Consider further evaluation with complete CT of the abdomen pelvis with contrast material for more thorough assessment of the bowel loops. 2. Nonobstructing right renal calculus. Electronically Signed   By: TKerby MoorsM.D.   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

## 2018-07-23 NOTE — Progress Notes (Signed)
ANTICOAGULATION CONSULT NOTE - Follow Up Consult  Pharmacy Consult for heparin Indication: bridge therapy while Eliquis for B PE and DVT on hold for SBO  Allergies  Allergen Reactions  . Ciprofloxacin Other (See Comments)    Leg tingling    Patient Measurements: Height: 5' 9"  (175.3 cm) Weight: 219 lb (99.3 kg) IBW/kg (Calculated) : 70.7 Heparin Dosing Weight:   Vital Signs: Temp: 98.7 F (37.1 C) (11/09 2130) Temp Source: Oral (11/09 2130) BP: 119/81 (11/09 2130) Pulse Rate: 111 (11/09 2130)  Labs: Recent Labs    07/21/18 1244 07/21/18 1815 07/22/18 0353 07/22/18 2318  HGB 9.3*  --  8.0*  --   HCT 30.0*  --  26.3*  --   PLT 401*  --  355  --   APTT  --  29  --  42*  HEPARINUNFRC  --  1.89*  --  0.81*  CREATININE 0.82  --  0.68  --     Estimated Creatinine Clearance: 111.2 mL/min (by C-G formula based on SCr of 0.68 mg/dL).   Medications:  Infusions:  . sodium chloride 75 mL/hr at 07/22/18 2030  . famotidine (PEPCID) IV 20 mg (07/22/18 1634)  . heparin 1,500 Units/hr (07/22/18 1511)    Assessment: Patient with high heparin level but low PTT.  PTT ordered with Heparin level until both correlate due to possible drug-lab interaction between oral anticoagulant (rivaroxaban, edoxaban, or apixaban) and anti-Xa level (aka heparin level)   No heparin issues per RN   Goal of Therapy:  Heparin level 0.3-0.7 units/ml aPTT 66-102 seconds Monitor platelets by anticoagulation protocol: Yes   Plan:  Increase heparin to 1650 units/hr Recheck PTT/heparin level at [place], Shea Stakes [place] 07/23/2018,12:48 AM

## 2018-07-23 NOTE — Progress Notes (Signed)
Report received from B. Richardson,RN. No change in assessement. Mark Frey

## 2018-07-24 ENCOUNTER — Telehealth: Payer: Self-pay | Admitting: Medical

## 2018-07-24 DIAGNOSIS — Z0189 Encounter for other specified special examinations: Secondary | ICD-10-CM

## 2018-07-24 LAB — GLUCOSE, CAPILLARY
GLUCOSE-CAPILLARY: 109 mg/dL — AB (ref 70–99)
GLUCOSE-CAPILLARY: 87 mg/dL (ref 70–99)
GLUCOSE-CAPILLARY: 110 mg/dL — AB (ref 70–99)
GLUCOSE-CAPILLARY: 125 mg/dL — AB (ref 70–99)
Glucose-Capillary: 114 mg/dL — ABNORMAL HIGH (ref 70–99)
Glucose-Capillary: 90 mg/dL (ref 70–99)
Glucose-Capillary: 90 mg/dL (ref 70–99)
Glucose-Capillary: 119 mg/dL — ABNORMAL HIGH (ref 70–99)
Glucose-Capillary: 94 mg/dL (ref 70–99)
Glucose-Capillary: 123 mg/dL — ABNORMAL HIGH (ref 70–99)

## 2018-07-24 LAB — BASIC METABOLIC PANEL
Anion gap: 7 (ref 5–15)
BUN: 11 mg/dL (ref 8–23)
CALCIUM: 8.4 mg/dL — AB (ref 8.9–10.3)
CO2: 23 mmol/L (ref 22–32)
CREATININE: 0.53 mg/dL — AB (ref 0.61–1.24)
Chloride: 108 mmol/L (ref 98–111)
GFR calc Af Amer: >60 mL/min (ref >60)
GLUCOSE: 125 mg/dL — AB (ref 70–99)
POTASSIUM: 3.3 mmol/L — AB (ref 3.5–5.1)
SODIUM: 138 mmol/L (ref 135–145)

## 2018-07-24 LAB — CBC
HEMATOCRIT: 26.4 % — AB (ref 39.0–52.0)
Hemoglobin: 7.9 g/dL — ABNORMAL LOW (ref 13.0–17.0)
MCH: 31.1 pg (ref 26.0–34.0)
MCHC: 29.9 g/dL — AB (ref 30.0–36.0)
MCV: 103.9 fL — AB (ref 80.0–100.0)
Platelets: 395 10*3/uL (ref 150–400)
RBC: 2.54 MIL/uL — ABNORMAL LOW (ref 4.22–5.81)
RDW: 18.7 % — AB (ref 11.5–15.5)
WBC: 8.3 10*3/uL (ref 4.0–10.5)
nRBC: 0 % (ref 0.0–0.2)

## 2018-07-24 LAB — HEPARIN LEVEL (UNFRACTIONATED): Heparin Unfractionated: 0.6 IU/mL (ref 0.30–0.70)

## 2018-07-24 LAB — APTT: aPTT: 67 seconds — ABNORMAL HIGH (ref 24–36)

## 2018-07-24 MED ORDER — HEPARIN SOD (PORK) LOCK FLUSH 100 UNIT/ML IV SOLN
500.0000 [IU] | INTRAVENOUS | Status: AC | PRN
  Administered 2018-07-24: 500 [IU]

## 2018-07-24 MED ORDER — APIXABAN 5 MG PO TABS
5.0000 mg | ORAL_TABLET | Freq: Two times a day (BID) | ORAL | Status: DC
  Administered 2018-07-24: 5 mg via ORAL
  Filled 2018-07-24: qty 1

## 2018-07-24 MED ORDER — POTASSIUM CHLORIDE CRYS ER 20 MEQ PO TBCR
40.0000 meq | EXTENDED_RELEASE_TABLET | ORAL | Status: DC
  Filled 2018-07-24: qty 2

## 2018-07-24 NOTE — Progress Notes (Signed)
ANTICOAGULATION CONSULT NOTE - Follow Up Consult  Pharmacy Consult for Heparin Indication: bridge therapy while Eliquis for B PE and DVT on hold for SBO  Allergies  Allergen Reactions  . Ciprofloxacin Other (See Comments)    Leg tingling    Patient Measurements: Height: 5' 9"  (175.3 cm) Weight: 219 lb (99.3 kg) IBW/kg (Calculated) : 70.7 Heparin Dosing Weight:   Vital Signs: Temp: 97.9 F (36.6 C) (11/10 2021) Temp Source: Oral (11/10 2021) BP: 132/65 (11/10 2021) Pulse Rate: 102 (11/10 2021)  Labs: Recent Labs    07/22/18 0353  07/23/18 0403 07/23/18 1113 07/23/18 1858 07/24/18 0345  HGB 8.0*  --  7.8*  --   --  7.9*  HCT 26.3*  --  26.3*  --   --  26.4*  PLT 355  --  427*  --   --  395  APTT  --    < >  --  38* 53* 67*  HEPARINUNFRC  --    < >  --  0.48 0.47 0.60  CREATININE 0.68  --  0.59*  --   --  0.53*   < > = values in this interval not displayed.    Estimated Creatinine Clearance: 111.2 mL/min (A) (by C-G formula based on SCr of 0.53 mg/dL (L)).   Medications:  Infusions:  . sodium chloride 1,000 mL (07/23/18 1603)  . heparin 2,000 Units/hr (07/23/18 2241)    Assessment: Patient with PTT at goal, but lower end of goal and heparin level at goal.  Still feel that heparin level might be slightly elevated due to prior apixaban.  PTT ordered with Heparin level until both correlate due to possible drug-lab interaction between oral anticoagulant (rivaroxaban, edoxaban, or apixaban) and anti-Xa level (aka heparin level)   Goal of Therapy:  Heparin level 0.3-0.7 units/ml aPTT 66-102 seconds Monitor platelets by anticoagulation protocol: Yes   Plan:  Increase heparin to 2100 units/hr Recheck ptt and heparin level at [place] 07/24/2018,4:36 AM

## 2018-07-24 NOTE — Telephone Encounter (Signed)
No los per 11/8

## 2018-07-24 NOTE — Final Consult Note (Signed)
[facility] Progress Note     Subjective: CC: sbo Patient feeling much better. Tolerating soft diet, appetite improving. Having loose BMs. Excited to get home today  Objective: Vital signs in last 24 hours: Temp:  [97.5 F (36.4 C)-98.9 F (37.2 C)] 97.5 F (36.4 C) (11/11 0445) Pulse Rate:  [98-102] 98 (11/11 0445) Resp:  [17-20] 17 (11/11 0445) BP: (123-132)/(65-86) 123/86 (11/11 0445) SpO2:  [94 %-97 %] 97 % (11/11 0445) Last BM Date: 07/23/18  Intake/Output from previous day: 11/10 0701 - 11/11 0700 In: 1046.2 [I.V.:1046.2] Out: 2 [Urine:1; Stool:1] Intake/Output this shift: Total I/O In: -  Out: 100 [Urine:100]  PE: Gen:  Alert, NAD, pleasant Card:  Regular rate and rhythm, pedal pulses 2+ BL Pulm:  Normal effort, clear to auscultation bilaterally Abd: Soft, non-tender, non-distended, bowel sounds present Skin: warm and dry, no rashes  Psych: A&Ox3   Lab Results:  Recent Labs    07/23/18 0403 07/24/18 0345  WBC 7.0 8.3  HGB 7.8* 7.9*  HCT 26.3* 26.4*  PLT 427* 395   BMET Recent Labs    07/23/18 0403 07/24/18 0345  NA 135 138  K 3.2* 3.3*  CL 104 108  CO2 23 23  GLUCOSE 103* 125*  BUN 13 11  CREATININE 0.59* 0.53*  CALCIUM 8.2* 8.4*   PT/INR No results for input(s): LABPROT, INR in the last 72 hours. CMP     Component Value Date/Time   NA 138 07/24/2018 0345   K 3.3 (L) 07/24/2018 0345   CL 108 07/24/2018 0345   CO2 23 07/24/2018 0345   GLUCOSE 125 (H) 07/24/2018 0345   BUN 11 07/24/2018 0345   CREATININE 0.53 (L) 07/24/2018 0345   CREATININE 0.89 07/17/2018 1150   CREATININE 1.26 12/01/2012 1700   CALCIUM 8.4 (L) 07/24/2018 0345   PROT 6.4 (L) 07/21/2018 1244   ALBUMIN 3.5 07/21/2018 1244   AST 13 (L) 07/21/2018 1244   AST 13 (L) 07/17/2018 1150   ALT 13 07/21/2018 1244   ALT 15 07/17/2018 1150   ALKPHOS 52 07/21/2018 1244   BILITOT 0.7 07/21/2018 1244   BILITOT 0.3 07/17/2018 1150   GFRNONAA >60 07/24/2018 0345   GFRNONAA >60 07/17/2018 1150   GFRAA >60 07/24/2018 0345   GFRAA >60 07/17/2018 1150   Lipase  No results found for: LIPASE     Studies/Results: Dg Abd 1 View  Result Date: 07/23/2018 CLINICAL DATA:  Follow-up small bowel obstruction. EXAM: ABDOMEN - 1 VIEW COMPARISON:  07/21/2018, radiographs and CT. FINDINGS: There is persistent dilation of the small bowel with minimal colonic air, findings similar to the prior exam, consistent with a high-grade partial small bowel obstruction. IMPRESSION: 1. No significant change. Persistent high-grade small bowel obstruction. Electronically Signed   By: Lajean Manes M.D.   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

## 2018-07-24 NOTE — Progress Notes (Signed)
Patient verbalized understanding of discharge instructions. Patient is stable at discharge.

## 2018-07-24 NOTE — Discharge Summary (Signed)
Physician Discharge Summary  Mark Frey YBO:[id number] DOB: [birth year] DOA: 07/21/2018  PCP: [name], MD  Admit date: 07/21/2018 Discharge date: 07/24/2018  Admitted From: Home  Disposition:  Home   Recommendations for Outpatient Follow-up and new medication changes:  1. Follow up with Dr. Yong Channel in 7 days 2. Please obtain renal panel in 2 days 3. Patient declined supplemental Kcl  Home Health: no   Equipment/Devices: no    Discharge Condition: stable  CODE STATUS: full  Diet recommendation: regular.   Brief/Interim Summary: 62 year old male who presented with diarrhea and abdominal pain. He does have the significant past medical history for Burkitt's lymphoma, andtype 2 diabetes mellitus,who presented from the cancer center after a CT of the abdomen and pelvis showing high-grade small bowel obstruction. Patient reported left lower quadrant and periumbilical abdominal pain for the last 2 days prior to hospitalization, associated with diarrhea and poor oral intake. Outpatient work-up included CT of the abdomen and pelvis. On his initial physical examination blood pressure 124/103, heart rate 121, respiratory rate 22, temperature 98.3, oxygen saturation 96%, moist mucous membranes, lungs clear to auscultation bilaterally, heart S1-S2 present, tachycardic, no gallops rubs or murmurs, abdomen distended, generalized tenderness, decreased bowel sounds, no lower extremity edema. Sodium 136, potassium 3.3, chloride 95, bicarb 29, glucose 184, BUN 23, creatinine 0.82, magnesium 1.8, AST 13, ALT 13, total bilirubin is 0.7, white count 6.3, hemoglobin 9.3, hematocrit 30.0, platelets 401, urinalysis negative for infection, specific gravity >1.046.Stool C. difficile negative. CT of the abdomen with high-grade partial small bowel obstruction or distal large bowel obstruction. CT of the pelvis with transition point of the small bowel obstruction in the pelvic region, 6 cm proximal to  the ileocecal valve. EKG sinus rhythm, normal axis, normal intervals.  Patient was admitted to the hospital with working diagnosis of partial small bowel obstruction.  1.  Partial small bowel obstruction.  Patient was admitted to the medical ward, he was placed in a remote telemetry monitor and a nasogastric tube was placed and connected to intermittent suction.  He received IV fluids, IV antiacids and as needed IV antiemetics.  He was kept nothing by mouth, and surgical service was consulted.  Patient responded well to conservative medical therapy, his symptoms improved, nasogastric tube was removed and his diet was advanced with good toleration.   2.  Bilateral pulmonary embolism.  Patient was placed on IV heparin for anticoagulation while n.p.o., when his symptoms improved apixaban was resumed, no bleeding complications identified.  3.  Hypokalemia.  Hypokalemia due to GI losses, patient declined potassium repletion with oral or IV potassium chloride.  No specific reason why he declined this treatment he said he will get potassium through his diet, I explained to him in detail complications of hypokalemia including arrhythmias and death, along of causes of hypokalemia including diarrhea.  He is aware of the consequences of not taking potassium supplementation.  Recommend check renal panel in the outpatient as soon as feasible ideally within the next 48 hours.  His discharge potassium is 3.3.  His kidney function stable 0.53 creatinine.   4.  Type 2 diabetes mellitus.  Patient was placed on insulin sliding scale for glucose coverage and monitoring.  His glucose remained well controlled.  5.  History of Burkitt's lymphoma, high-grade B cell.  His cell counts remained stable, discharge hemoglobin 8.3, hemoglobin 7.9, hematocrit 26.4 and platelets 395, follow-up as an outpatient.  Discharge Diagnoses:  Principal Problem:   SBO (small bowel obstruction) (HCC) Active  Problems:   Bilateral pulmonary  embolism (HCC)   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HCC)   Diabetes mellitus ([place])   Burkitt lymphoma, lymph nodes of multiple sites [facility])   Hypokalemia   Diarrhea    Discharge Instructions   Allergies as of 07/24/2018      Reactions   Ciprofloxacin Other (See Comments)   Leg tingling      Medication List    TAKE these medications   apixaban 5 MG Tabs tablet Commonly known as:  ELIQUIS Take 1 tablet (5 mg total) by mouth 2 (two) times daily.   blood glucose meter kit and supplies Dispense based on patient and insurance preference. Use daily as directed. (E11.9).   Choline Fenofibrate 135 MG capsule TAKE 1 CAPSULE DAILY What changed:  when to take this   furosemide 20 MG tablet Commonly known as:  LASIX TAKE 1 TABLET BY MOUTH DAILY HOLD AFTER 10-15 DAYS IF LEG SWELLING RESOLVED OR IF >10LBS WEIGHT LOSS What changed:  See the new instructions.   glucose blood test strip Use to check blood sugar daily   HYDROcodone-acetaminophen 5-325 MG tablet Commonly known as:  NORCO/VICODIN Take 1-2 tablets by mouth every 4 (four) hours as needed for moderate pain.   JANUMET 50-1000 MG tablet Generic drug:  sitaGLIPtin-metformin TAKE 1 TABLET TWICE DAILY  WITH MEALS   lidocaine-prilocaine cream Commonly known as:  EMLA Apply 1 application topically as needed. What changed:    when to take this  reasons to take this   Magnesium 500 MG Caps Take 1 capsule (500 mg total) by mouth 2 (two) times daily. What changed:  when to take this   metroNIDAZOLE 500 MG tablet Commonly known as:  FLAGYL Take 1 tablet (500 mg total) by mouth 3 (three) times daily.   ondansetron 8 MG tablet Commonly known as:  ZOFRAN Take 1 tablet (8 mg total) by mouth every 8 (eight) hours as needed for nausea or vomiting.   senna-docusate 8.6-50 MG tablet Commonly known as:  Senokot-S Take 2 tablets by mouth at bedtime. What changed:    when to take this  reasons to take this       Allergies   Allergen Reactions  . Ciprofloxacin Other (See Comments)    Leg tingling    Consultations:  Surgery   Oncology    Procedures/Studies: Dg Abd 1 View  Result Date: 07/23/2018 CLINICAL DATA:  Follow-up small bowel obstruction. EXAM: ABDOMEN - 1 VIEW COMPARISON:  07/21/2018, radiographs and CT. FINDINGS: There is persistent dilation of the small bowel with minimal colonic air, findings similar to the prior exam, consistent with a high-grade partial small bowel obstruction. IMPRESSION: 1. No significant change. Persistent high-grade small bowel obstruction. Electronically Signed   By: Lajean Manes M.D.   On: 07/23/2018 08:51   Dg Abdomen 1 View  Result Date: 07/21/2018 CLINICAL DATA:  Bedside nasogastric tube placement. Small bowel obstruction. EXAM: ABDOMEN - 1 VIEW COMPARISON:  CT abdomen and pelvis earlier same day. Abdominal x-rays 07/20/2018, 07/17/2018. FINDINGS: Persistent markedly distended loops of small bowel diffusely throughout the abdomen. The CT pelvis earlier today showed that the transition point is in the pelvis. Gas is present within normal caliber colon, indicating incomplete obstruction. Nasogastric tube tip fundus of the stomach. Contrast material in the urinary bladder related to the IV contrast administration earlier today. IMPRESSION: 1. Nasogastric tube tip in the fundus the stomach. 2. High-grade partial small bowel obstruction as demonstrated on the CT earlier today, worse than  on yesterday's KUB. Electronically Signed   By: Evangeline Dakin M.D.   On: 07/21/2018 17:01   Dg Abd 1 View  Result Date: 07/20/2018 CLINICAL DATA:  Abdominal distention and excess gas. EXAM: ABDOMEN - 1 VIEW COMPARISON:  07/17/2018. FINDINGS: Interval dilated small bowel loops. There is also an interval nodular mucosal pattern involving the filled components of the transverse, distal descending and proximal sigmoid colon. No gross free peritoneal air. Lower thoracic and mild lumbar spine  degenerative changes. IMPRESSION: 1. Interval partial small bowel obstruction. 2. Interval nodular mucosal pattern involving the transverse, distal descending and proximal sigmoid colon. This could be due to infectious or inflammatory colitis. Electronically Signed   By: Claudie Revering M.D.   On: 07/20/2018 12:06   Dg Abd 1 View  Result Date: 07/17/2018 CLINICAL DATA:  Left lower quadrant abdominal pain. EXAM: ABDOMEN - 1 VIEW COMPARISON:  06/09/2018 FINDINGS: Moderate amount of intestinal gas which could be due to ileus or partial obstruction. Right renal stone again seen. No acute or significant bone finding. IMPRESSION: Moderate amount of intestinal gas within small and large bowel that could be due to ileus or partial obstruction. Relative paucity of gas projects over the pelvis. Right renal stone, chronic. Electronically Signed   By: Nelson Chimes M.D.   On: 07/17/2018 13:26   Ct Pelvis W Contrast  Result Date: 07/21/2018 CLINICAL DATA:  62 year old with current history of B-cell lymphoma, with evidence of partial small bowel obstruction on abdominal x-rays yesterday. CT abdomen was performed earlier today demonstrating a high-grade small-bowel obstruction, though the transition point was not visible on that examination. EXAM: CT PELVIS WITH CONTRAST (10:40 a.m.: TECHNIQUE: Multidetector CT imaging of the pelvis was performed using the standard protocol following the bolus administration of intravenous contrast. CONTRAST:  One hundred ISOVUE-300 IOPAMIDOL INJECTION 61% IV and oral contrast which were administered at the time of the CT abdomen performed earlier same day at 8:47 a.m. COMPARISON:  CT abdomen and pelvis performed earlier same day. (These examinations will be combined into a single CT abdomen and pelvis order). PET-CT 06/09/2018, 04/18/2018. FINDINGS: Urinary Tract: See report of CT abdomen performed earlier same day as the kidneys are not included on the current examination. Urinary bladder  decompressed and normal in appearance, containing contrast. Contrast within normal caliber distal LEFT ureter. Bowel: Dilated loops of small bowel throughout the pelvis with a transition to normal caliber distal and terminal ileum approximately 6 cm proximal to the ileocecal valve. A small amount of the previously administered oral contrast material is present in the normal caliber distal ileum and in the cecum and ascending colon, indicating incomplete obstruction. The transition point is at the site of previously treated lymphoma where there is scar and post treatment change. Scattered sigmoid colon diverticula are present. Liquid stool is present within normal caliber visualized colon. Vascular/Lymphatic: Scar/post treatment change in the pelvis at the site of the previously treated lymphoma in the pelvis. No residual mass in the ANTERIOR pelvis at the site of the FDG avid lymphoma on the 06/09/2018 PET/CT. No pathologic lymphadenopathy in the pelvis currently. No visible aorto-iliofemoral atherosclerosis. Reproductive: Calcifications within normal sized prostate gland. Normal appearing seminal vesicles. Other:  None. Musculoskeletal: Degenerative changes involving the sacroiliac joints with partial ankylosis of the RIGHT SI joint. 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 Facet degenerative changes at L5-S1. No acute findings. IMPRESSION: 1. The transition point for the small-bowel obstruction is in the  pelvis and appears to be approximately 6 cm proximal to the ileocecal valve. The obstruction is felt to be secondary to scar/post treatment change in the pelvis at the site of the previous lymphoma. 2. The FDG avid mass in the ANTERIOR pelvis on the recent PET-CT 06/09/2018 has resolved. No pathologic lymphadenopathy in the pelvis currently. Electronically Signed   By: Evangeline Dakin M.D.   On: 07/21/2018 11:28   Ct Abdomen W Contrast  Result Date:  07/21/2018 CLINICAL DATA:  Left-sided abdominal pain and bloating. Nausea vomiting diarrhea. History of lymphoma. EXAM: CT ABDOMEN WITH CONTRAST TECHNIQUE: Multidetector CT imaging of the abdomen was performed using the standard protocol following bolus administration of intravenous contrast. CONTRAST:  171m OMNIPAQUE IOHEXOL 300 MG/ML  SOLN COMPARISON:  PET-CT from 06/09/2018 FINDINGS: Lower chest: Subsegmental atelectasis noted within the lung bases no pleural effusion or airspace densities. Hepatobiliary: Tiny low-density structure within the dome of liver measures 4 mm and is too small to characterize. Unchanged. There is relative hypertrophy of the lateral segment of left lobe of liver. No contour abnormality identified. Gallbladder appears normal. No biliary dilatation. Pancreas: Pancreas appears normal. Spleen: Spleen is unremarkable. Adrenals/Urinary Tract: Normal adrenal glands. Small bilateral low-density kidney lesions are noted, these are less than 1 cm and too small to reliably characterize. Right renal calculus measures 6 mm. No hydronephrosis noted bilaterally. Stomach/Bowel: Stomach appears normal. There is abnormal increase caliber of the visualized small bowel loops which measure up to 4.4 cm. There is also diffuse distension of the colon. Small and large bowel air-fluid levels are identified. The pelvic bowel loops are not included on this exam. Vascular/Lymphatic: Mild aortic atherosclerosis. No aneurysm. No abdominopelvic adenopathy identified. Other: There is trace fluid identified along the pericolic gutters. No pneumoperitoneum. No focal fluid collections identified. Musculoskeletal: Mild spondylosis within the thoracic and lumbar spine. IMPRESSION: 1. Imaging findings concerning for high-grade bowel obstruction. As this exam is of the abdomen only the bowel loops are incompletely visualized, and therefore the transition point (etiology of the obstruction)is indeterminate. Findings may  reflect a high-grade partial small bowel obstruction or distal large bowel obstruction. Metabolically active tumor within the pelvis was identified on PET scan from 06/09/2018 and could potentially reflect a site of obstruction. Consider further evaluation with complete CT of the abdomen pelvis with contrast material for more thorough assessment of the bowel loops. 2. Nonobstructing right renal calculus. Electronically Signed   By: TKerby MoorsM.D.   On: 07/21/2018 09:30       Subjective: Patient is feeling better, no nausea or vomiting ,and tolerating po well. No dyspnea or chest pain.   Discharge Exam: Vitals:   07/23/18 2021 07/24/18 0445  BP: 132/65 123/86  Pulse: (!) 102 98  Resp: 18 17  Temp: 97.9 F (36.6 C) (!) 97.5 F (36.4 C)  SpO2: 95% 97%   Vitals:   07/23/18 0535 07/23/18 1355 07/23/18 2021 07/24/18 0445  BP: 117/76 125/78 132/65 123/86  Pulse: (!) 101 (!) 101 (!) 102 98  Resp: _0 Temp: 98.1 F (36.7 C) 98.9 F (37.2 C) 97.9 F (36.6 C) (!) 97.5 F (36.4 C)  TempSrc: Oral Oral Oral Oral  SpO2: 95% 94% 95% 97%  Weight:      Height:        General: Not in pain or dyspnea.  Neurology: Awake and alert, non focal  E ENT: no pallor, no icterus, oral mucosa moist Cardiovascular: No JVD. S1-S2 present, rhythmic, no gallops,  rubs, or murmurs. No lower extremity edema. Pulmonary: vesicular breath sounds bilaterally, adequate air movement, no wheezing, rhonchi or rales. Gastrointestinal. Abdomen protuberant with no organomegaly, non tender, no rebound or guarding Skin. No rashes Musculoskeletal: no joint deformities   The results of significant diagnostics from this hospitalization (including imaging, microbiology, ancillary and laboratory) are listed below for reference.     Microbiology: Recent Results (from the past 240 hour(s))  Gastrointestinal Panel by PCR , Stool     Status: None   Collection Time: 07/21/18  2:33 PM  Result Value Ref Range  Status   Campylobacter species NOT DETECTED NOT DETECTED Final   Plesimonas shigelloides NOT DETECTED NOT DETECTED Final   Salmonella species NOT DETECTED NOT DETECTED Final   Yersinia enterocolitica NOT DETECTED NOT DETECTED Final   Vibrio species NOT DETECTED NOT DETECTED Final   Vibrio cholerae NOT DETECTED NOT DETECTED Final   Enteroaggregative E coli (EAEC) NOT DETECTED NOT DETECTED Final   Enteropathogenic E coli (EPEC) NOT DETECTED NOT DETECTED Final   Enterotoxigenic E coli (ETEC) NOT DETECTED NOT DETECTED Final   Shiga like toxin producing E coli (STEC) NOT DETECTED NOT DETECTED Final   Shigella/Enteroinvasive E coli (EIEC) NOT DETECTED NOT DETECTED Final   Cryptosporidium NOT DETECTED NOT DETECTED Final   Cyclospora cayetanensis NOT DETECTED NOT DETECTED Final   Entamoeba histolytica NOT DETECTED NOT DETECTED Final   Giardia lamblia NOT DETECTED NOT DETECTED Final   Adenovirus F40/41 NOT DETECTED NOT DETECTED Final   Astrovirus NOT DETECTED NOT DETECTED Final   Norovirus GI/GII NOT DETECTED NOT DETECTED Final   Rotavirus A NOT DETECTED NOT DETECTED Final   Sapovirus (I, II, IV, and V) NOT DETECTED NOT DETECTED Final    Comment: Performed at [facility], [address] [place], [place] [address]  C difficile quick scan w PCR reflex     Status: None   Collection Time: 07/21/18  2:43 PM  Result Value Ref Range Status   C Diff antigen NEGATIVE NEGATIVE Final   C Diff toxin NEGATIVE NEGATIVE Final   C Diff interpretation No C. difficile detected.  Final    Comment: Performed at [facility], [place] [address], [place], [place] [address]     Labs: BNP (last 3 results) No results for input(s): BNP in the last 8760 hours. Basic Metabolic Panel: Recent Labs  Lab 07/17/18 1150 07/21/18 1244 07/22/18 0353 07/23/18 0403 07/24/18 0345  NA 138 136 137 135 138  K 4.0 3.3* 3.4* 3.2* 3.3*  CL 103 95* 100 104 108  CO2 _0 GLUCOSE 146* 184*  134* 103* 125*  BUN _1 CREATININE 0.89 0.82 0.68 0.59* 0.53*  CALCIUM 10.0 9.0 8.7* 8.2* 8.4*  MG  --  1.8  --   --   --    Liver Function Tests: Recent Labs  Lab 07/17/18 1150 07/21/18 1244  AST 13* 13*  ALT 15 13  ALKPHOS 55 52  BILITOT 0.3 0.7  PROT 6.7 6.4*  ALBUMIN 3.3* 3.5   No results for input(s): LIPASE, AMYLASE in the last 168 hours. No results for input(s): AMMONIA in the last 168 hours. CBC: Recent Labs  Lab 07/17/18 1150 07/21/18 1244 07/22/18 0353 07/23/18 0403 07/24/18 0345  WBC 11.5* 6.3 5.5 7.0 8.3  NEUTROABS 7.9* 4.8  --   --   --   HGB 9.0* 9.3* 8.0* 7.8* 7.9*  HCT 29.1* 30.0* 26.3* 26.3* 26.4*  MCV 98.6 100.0 102.3* 102.3* 103.9*  PLT 200 401* 355 427* 395   Cardiac Enzymes: No results for input(s): CKTOTAL, CKMB, CKMBINDEX, TROPONINI in the last 168 hours. BNP: Invalid input(s): POCBNP CBG: Recent Labs  Lab 07/23/18 0739 07/23/18 1131 07/23/18 1639 07/23/18 2023 07/24/18 0729  GLUCAP 90 119* 110* 125* 94   D-Dimer No results for input(s): DDIMER in the last 72 hours. Hgb A1c No results for input(s): HGBA1C in the last 72 hours. Lipid Profile No results for input(s): CHOL, HDL, LDLCALC, TRIG, CHOLHDL, LDLDIRECT in the last 72 hours. Thyroid function studies No results for input(s): TSH, T4TOTAL, T3FREE, THYROIDAB in the last 72 hours.  Invalid input(s): FREET3 Anemia work up No results for input(s): VITAMINB12, FOLATE, FERRITIN, TIBC, IRON, RETICCTPCT in the last 72 hours. Urinalysis    Component Value Date/Time   COLORURINE YELLOW 07/21/2018 1431   APPEARANCEUR CLEAR 07/21/2018 1431   LABSPEC >1.046 (H) 07/21/2018 1431   PHURINE 5.0 07/21/2018 1431   GLUCOSEU NEGATIVE 07/21/2018 1431   HGBUR NEGATIVE 07/21/2018 1431   HGBUR negative 01/20/2009 0840   BILIRUBINUR NEGATIVE 07/21/2018 1431   BILIRUBINUR Small 01/30/2018 1652   KETONESUR 5 (A) 07/21/2018 1431   PROTEINUR 30 (A) 07/21/2018 1431   UROBILINOGEN 2.0  (A) 01/30/2018 1652   UROBILINOGEN 0.2 01/20/2009 0840   NITRITE NEGATIVE 07/21/2018 1431   LEUKOCYTESUR NEGATIVE 07/21/2018 1431   Sepsis Labs Invalid input(s): PROCALCITONIN,  WBC,  LACTICIDVEN Microbiology Recent Results (from the past 240 hour(s))  Gastrointestinal Panel by PCR , Stool     Status: None   Collection Time: 07/21/18  2:33 PM  Result Value Ref Range Status   Campylobacter species NOT DETECTED NOT DETECTED Final   Plesimonas shigelloides NOT DETECTED NOT DETECTED Final   Salmonella species NOT DETECTED NOT DETECTED Final   Yersinia enterocolitica NOT DETECTED NOT DETECTED Final   Vibrio species NOT DETECTED NOT DETECTED Final   Vibrio cholerae NOT DETECTED NOT DETECTED Final   Enteroaggregative E coli (EAEC) NOT DETECTED NOT DETECTED Final   Enteropathogenic E coli (EPEC) NOT DETECTED NOT DETECTED Final   Enterotoxigenic E coli (ETEC) NOT DETECTED NOT DETECTED Final   Shiga like toxin producing E coli (STEC) NOT DETECTED NOT DETECTED Final   Shigella/Enteroinvasive E coli (EIEC) NOT DETECTED NOT DETECTED Final   Cryptosporidium NOT DETECTED NOT DETECTED Final   Cyclospora cayetanensis NOT DETECTED NOT DETECTED Final   Entamoeba histolytica NOT DETECTED NOT DETECTED Final   Giardia lamblia NOT DETECTED NOT DETECTED Final   Adenovirus F40/41 NOT DETECTED NOT DETECTED Final   Astrovirus NOT DETECTED NOT DETECTED Final   Norovirus GI/GII NOT DETECTED NOT DETECTED Final   Rotavirus A NOT DETECTED NOT DETECTED Final   Sapovirus (I, II, IV, and V) NOT DETECTED NOT DETECTED Final    Comment: Performed at [facility], [address] [place], [place] [address]  C difficile quick scan w PCR reflex     Status: None   Collection Time: 07/21/18  2:43 PM  Result Value Ref Range Status   C Diff antigen NEGATIVE NEGATIVE Final   C Diff toxin NEGATIVE NEGATIVE Final   C Diff interpretation No C. difficile detected.  Final    Comment: Performed at [facility], [place] [address], [place], [place] [address]     Time coordinating discharge: 45 minutes  SIGNED:   Tawni Millers, MD  [facility] 07/24/2018, 11:14 AM Pager [phone]  If 7PM-7AM, please contact night-coverage [email or web address]  Password [email or web address] '

## 2018-07-25 ENCOUNTER — Telehealth: Payer: Self-pay | Admitting: *Deleted

## 2018-07-25 NOTE — Telephone Encounter (Signed)
Left voicemail requesting call back. Does patient need hospital follow up appt or does he plan to follow up with Oncologist?

## 2018-07-25 NOTE — Telephone Encounter (Signed)
Oncology follow up is fine on my end unless he wants to see [place] for follow up. I think minimizing his vists makes sense though

## 2018-07-25 NOTE — Telephone Encounter (Signed)
Pt wife stated that they will follow up with oncologist next week

## 2018-07-26 NOTE — Telephone Encounter (Signed)
noted 

## 2018-07-28 ENCOUNTER — Encounter (HOSPITAL_COMMUNITY)
Admission: RE | Admit: 2018-07-28 | Discharge: 2018-07-28 | Disposition: A | Discharge status: Home / Self Care | Payer: BLUE CROSS/BLUE SHIELD | Source: Ambulatory Visit | Attending: Hematology | Admitting: Hematology

## 2018-07-28 ENCOUNTER — Telehealth: Payer: Self-pay | Admitting: *Deleted

## 2018-07-28 DIAGNOSIS — C8378 Burkitt lymphoma, lymph nodes of multiple sites: Secondary | ICD-10-CM | POA: Diagnosis not present

## 2018-07-28 DIAGNOSIS — C8370 Burkitt lymphoma, unspecified site: Secondary | ICD-10-CM | POA: Diagnosis not present

## 2018-07-28 LAB — GLUCOSE, CAPILLARY: GLUCOSE-CAPILLARY: 103 mg/dL — AB (ref 70–99)

## 2018-07-28 MED ORDER — FLUDEOXYGLUCOSE F - 18 (FDG) INJECTION
11.1000 | Freq: Once | INTRAVENOUS | Status: AC | PRN
  Administered 2018-07-28: 11.1 via INTRAVENOUS

## 2018-07-28 NOTE — Telephone Encounter (Signed)
Received call from Dr. Dahlia Client Radiology. Completed patient's PET/CT today. Noted thickened bowel wall in Sigmoid area and he stated it may indicate several conditions, including diverticulitis. Advised Dr. Ilda Foil that patient has appt with [name] on Tuesday. Dr. Ilda Foil stated results of scan are now inpatient chart.  Dr. Ilda Foil asked that patient be contacted this afternoon to inquire about any discomfort. Patient contacted @ [phone]. Left VM stating that Dr. Grier Mitts office was inquiring as to how he was feeling this afternoon following his PET scan. Advised him to contact office for any questions or concerns.

## 2018-07-30 ENCOUNTER — Inpatient Hospital Stay (HOSPITAL_COMMUNITY): Payer: BLUE CROSS/BLUE SHIELD

## 2018-07-30 ENCOUNTER — Other Ambulatory Visit: Payer: Self-pay

## 2018-07-30 ENCOUNTER — Emergency Department (HOSPITAL_COMMUNITY): Payer: BLUE CROSS/BLUE SHIELD

## 2018-07-30 ENCOUNTER — Encounter (HOSPITAL_COMMUNITY): Payer: Self-pay

## 2018-07-30 ENCOUNTER — Inpatient Hospital Stay (HOSPITAL_COMMUNITY)
Admission: EM | Admit: 2018-07-30 | Discharge: 2018-08-04 | DRG: 842 | Disposition: A | Discharge status: Home / Self Care | Payer: BLUE CROSS/BLUE SHIELD | Attending: Internal Medicine | Admitting: Internal Medicine

## 2018-07-30 DIAGNOSIS — K56609 Unspecified intestinal obstruction, unspecified as to partial versus complete obstruction: Secondary | ICD-10-CM | POA: Diagnosis present

## 2018-07-30 DIAGNOSIS — T451X5A Adverse effect of antineoplastic and immunosuppressive drugs, initial encounter: Secondary | ICD-10-CM | POA: Diagnosis present

## 2018-07-30 DIAGNOSIS — D630 Anemia in neoplastic disease: Secondary | ICD-10-CM | POA: Diagnosis not present

## 2018-07-30 DIAGNOSIS — K56699 Other intestinal obstruction unspecified as to partial versus complete obstruction: Secondary | ICD-10-CM | POA: Diagnosis not present

## 2018-07-30 DIAGNOSIS — C8370 Burkitt lymphoma, unspecified site: Secondary | ICD-10-CM | POA: Diagnosis not present

## 2018-07-30 DIAGNOSIS — Z86711 Personal history of pulmonary embolism: Secondary | ICD-10-CM

## 2018-07-30 DIAGNOSIS — T380X5A Adverse effect of glucocorticoids and synthetic analogues, initial encounter: Secondary | ICD-10-CM | POA: Diagnosis present

## 2018-07-30 DIAGNOSIS — I10 Essential (primary) hypertension: Secondary | ICD-10-CM | POA: Diagnosis not present

## 2018-07-30 DIAGNOSIS — E669 Obesity, unspecified: Secondary | ICD-10-CM | POA: Diagnosis not present

## 2018-07-30 DIAGNOSIS — D6481 Anemia due to antineoplastic chemotherapy: Secondary | ICD-10-CM | POA: Diagnosis present

## 2018-07-30 DIAGNOSIS — K56600 Partial intestinal obstruction, unspecified as to cause: Secondary | ICD-10-CM | POA: Diagnosis not present

## 2018-07-30 DIAGNOSIS — E11649 Type 2 diabetes mellitus with hypoglycemia without coma: Secondary | ICD-10-CM | POA: Diagnosis not present

## 2018-07-30 DIAGNOSIS — Z801 Family history of malignant neoplasm of trachea, bronchus and lung: Secondary | ICD-10-CM

## 2018-07-30 DIAGNOSIS — E1165 Type 2 diabetes mellitus with hyperglycemia: Secondary | ICD-10-CM | POA: Diagnosis present

## 2018-07-30 DIAGNOSIS — I82411 Acute embolism and thrombosis of right femoral vein: Secondary | ICD-10-CM | POA: Diagnosis present

## 2018-07-30 DIAGNOSIS — R790 Abnormal level of blood mineral: Secondary | ICD-10-CM | POA: Diagnosis not present

## 2018-07-30 DIAGNOSIS — Z86718 Personal history of other venous thrombosis and embolism: Secondary | ICD-10-CM | POA: Diagnosis present

## 2018-07-30 DIAGNOSIS — N200 Calculus of kidney: Secondary | ICD-10-CM | POA: Diagnosis not present

## 2018-07-30 DIAGNOSIS — R197 Diarrhea, unspecified: Secondary | ICD-10-CM | POA: Diagnosis not present

## 2018-07-30 DIAGNOSIS — C8510 Unspecified B-cell lymphoma, unspecified site: Secondary | ICD-10-CM | POA: Diagnosis present

## 2018-07-30 DIAGNOSIS — E785 Hyperlipidemia, unspecified: Secondary | ICD-10-CM | POA: Diagnosis present

## 2018-07-30 DIAGNOSIS — Z4682 Encounter for fitting and adjustment of non-vascular catheter: Secondary | ICD-10-CM | POA: Diagnosis not present

## 2018-07-30 DIAGNOSIS — Z6832 Body mass index (BMI) 32.0-32.9, adult: Secondary | ICD-10-CM

## 2018-07-30 DIAGNOSIS — Z7901 Long term (current) use of anticoagulants: Secondary | ICD-10-CM | POA: Diagnosis not present

## 2018-07-30 DIAGNOSIS — Z79899 Other long term (current) drug therapy: Secondary | ICD-10-CM

## 2018-07-30 DIAGNOSIS — K5732 Diverticulitis of large intestine without perforation or abscess without bleeding: Secondary | ICD-10-CM | POA: Diagnosis not present

## 2018-07-30 DIAGNOSIS — Z808 Family history of malignant neoplasm of other organs or systems: Secondary | ICD-10-CM

## 2018-07-30 DIAGNOSIS — E876 Hypokalemia: Secondary | ICD-10-CM | POA: Diagnosis not present

## 2018-07-30 DIAGNOSIS — Z7984 Long term (current) use of oral hypoglycemic drugs: Secondary | ICD-10-CM

## 2018-07-30 DIAGNOSIS — Z881 Allergy status to other antibiotic agents status: Secondary | ICD-10-CM

## 2018-07-30 DIAGNOSIS — Z8572 Personal history of non-Hodgkin lymphomas: Secondary | ICD-10-CM | POA: Diagnosis present

## 2018-07-30 DIAGNOSIS — E1159 Type 2 diabetes mellitus with other circulatory complications: Secondary | ICD-10-CM | POA: Diagnosis present

## 2018-07-30 LAB — CBC WITH DIFFERENTIAL/PLATELET
Abs Immature Granulocytes: 0.21 10*3/uL — ABNORMAL HIGH (ref 0.00–0.07)
BASOS PCT: 0 %
Basophils Absolute: 0 10*3/uL (ref 0.0–0.1)
EOS ABS: 0 10*3/uL (ref 0.0–0.5)
EOS PCT: 0 %
HCT: 31.8 % — ABNORMAL LOW (ref 39.0–52.0)
HEMOGLOBIN: 9.6 g/dL — AB (ref 13.0–17.0)
Immature Granulocytes: 2 %
LYMPHS PCT: 6 %
Lymphs Abs: 0.6 10*3/uL — ABNORMAL LOW (ref 0.7–4.0)
MCH: 30.6 pg (ref 26.0–34.0)
MCHC: 30.2 g/dL (ref 30.0–36.0)
MCV: 101.3 fL — ABNORMAL HIGH (ref 80.0–100.0)
MONO ABS: 1.2 10*3/uL — AB (ref 0.1–1.0)
Monocytes Relative: 13 %
NRBC: 0 % (ref 0.0–0.2)
Neutro Abs: 7.3 10*3/uL (ref 1.7–7.7)
Neutrophils Relative %: 79 %
Platelets: 433 10*3/uL — ABNORMAL HIGH (ref 150–400)
RBC: 3.14 MIL/uL — AB (ref 4.22–5.81)
RDW: 18.6 % — AB (ref 11.5–15.5)
WBC: 9.3 10*3/uL (ref 4.0–10.5)

## 2018-07-30 LAB — URINALYSIS, ROUTINE W REFLEX MICROSCOPIC
Bilirubin Urine: NEGATIVE
Glucose, UA: NEGATIVE mg/dL
Hgb urine dipstick: NEGATIVE
KETONES UR: 5 mg/dL — AB
Leukocytes, UA: NEGATIVE
Nitrite: NEGATIVE
PH: 5 (ref 5.0–8.0)
PROTEIN: 100 mg/dL — AB
Specific Gravity, Urine: >1.046 — ABNORMAL HIGH (ref 1.005–1.030)

## 2018-07-30 LAB — COMPREHENSIVE METABOLIC PANEL
ALK PHOS: 55 U/L (ref 38–126)
ALT: 12 U/L (ref 0–44)
AST: 15 U/L (ref 15–41)
Albumin: 3.2 g/dL — ABNORMAL LOW (ref 3.5–5.0)
Anion gap: 11 (ref 5–15)
BUN: 16 mg/dL (ref 8–23)
CALCIUM: 8.5 mg/dL — AB (ref 8.9–10.3)
CO2: 27 mmol/L (ref 22–32)
CREATININE: 0.59 mg/dL — AB (ref 0.61–1.24)
Chloride: 102 mmol/L (ref 98–111)
GFR calc non Af Amer: >60 mL/min (ref >60)
GLUCOSE: 146 mg/dL — AB (ref 70–99)
Potassium: 2.6 mmol/L — CL (ref 3.5–5.1)
SODIUM: 140 mmol/L (ref 135–145)
Total Bilirubin: 0.5 mg/dL (ref 0.3–1.2)
Total Protein: 6.1 g/dL — ABNORMAL LOW (ref 6.5–8.1)

## 2018-07-30 LAB — GLUCOSE, CAPILLARY: Glucose-Capillary: 124 mg/dL — ABNORMAL HIGH (ref 70–99)

## 2018-07-30 LAB — LIPASE, BLOOD: Lipase: 18 U/L (ref 11–51)

## 2018-07-30 LAB — MAGNESIUM: Magnesium: 1.3 mg/dL — ABNORMAL LOW (ref 1.7–2.4)

## 2018-07-30 MED ORDER — MAGNESIUM SULFATE 2 GM/50ML IV SOLN
2.0000 g | Freq: Once | INTRAVENOUS | Status: AC
  Administered 2018-07-30: 2 g via INTRAVENOUS
  Filled 2018-07-30: qty 50

## 2018-07-30 MED ORDER — LIDOCAINE HCL URETHRAL/MUCOSAL 2 % EX GEL
1.0000 "application " | Freq: Once | CUTANEOUS | Status: AC
  Administered 2018-07-30: 1
  Filled 2018-07-30: qty 5

## 2018-07-30 MED ORDER — IOPAMIDOL (ISOVUE-300) INJECTION 61%
INTRAVENOUS | Status: AC
  Filled 2018-07-30: qty 100

## 2018-07-30 MED ORDER — POTASSIUM CHLORIDE 10 MEQ/100ML IV SOLN
10.0000 meq | INTRAVENOUS | Status: AC
  Administered 2018-07-30 (×4): 10 meq via INTRAVENOUS
  Filled 2018-07-30 (×4): qty 100

## 2018-07-30 MED ORDER — ACETAMINOPHEN 325 MG PO TABS: 650.0000 mg | ORAL_TABLET | Freq: Four times a day (QID) | ORAL | Status: DC | PRN

## 2018-07-30 MED ORDER — ACETAMINOPHEN 650 MG RE SUPP: 650.0000 mg | Freq: Four times a day (QID) | RECTAL | Status: DC | PRN

## 2018-07-30 MED ORDER — IOPAMIDOL (ISOVUE-300) INJECTION 61%
100.0000 mL | Freq: Once | INTRAVENOUS | Status: AC | PRN
  Administered 2018-07-30: 100 mL via INTRAVENOUS

## 2018-07-30 MED ORDER — ONDANSETRON HCL 4 MG/2ML IJ SOLN
4.0000 mg | Freq: Four times a day (QID) | INTRAMUSCULAR | Status: DC | PRN
  Administered 2018-07-30 – 2018-07-31 (×3): 4 mg via INTRAVENOUS
  Filled 2018-07-30 (×3): qty 2

## 2018-07-30 MED ORDER — PIPERACILLIN-TAZOBACTAM 3.375 G IVPB 30 MIN
3.3750 g | Freq: Once | INTRAVENOUS | Status: AC
  Administered 2018-07-31: 3.375 g via INTRAVENOUS
  Filled 2018-07-30: qty 50

## 2018-07-30 MED ORDER — INSULIN ASPART 100 UNIT/ML ~~LOC~~ SOLN
0.0000 [IU] | SUBCUTANEOUS | Status: DC
  Administered 2018-07-30: 1 [IU] via SUBCUTANEOUS

## 2018-07-30 MED ORDER — SODIUM CHLORIDE (PF) 0.9 % IJ SOLN
INTRAMUSCULAR | Status: AC
  Filled 2018-07-30: qty 50

## 2018-07-30 MED ORDER — LACTATED RINGERS IV BOLUS
1000.0000 mL | Freq: Once | INTRAVENOUS | Status: AC
  Administered 2018-07-30: 1000 mL via INTRAVENOUS

## 2018-07-30 MED ORDER — POTASSIUM CHLORIDE 2 MEQ/ML IV SOLN
INTRAVENOUS | Status: DC
  Administered 2018-07-30 – 2018-07-31 (×2): via INTRAVENOUS
  Filled 2018-07-30 (×8): qty 1000

## 2018-07-30 MED ORDER — ONDANSETRON HCL 4 MG PO TABS: 4.0000 mg | ORAL_TABLET | Freq: Four times a day (QID) | ORAL | Status: DC | PRN

## 2018-07-30 MED ORDER — PROCHLORPERAZINE EDISYLATE 10 MG/2ML IJ SOLN
10.0000 mg | Freq: Once | INTRAMUSCULAR | Status: AC
  Administered 2018-07-30: 10 mg via INTRAVENOUS
  Filled 2018-07-30: qty 2

## 2018-07-30 NOTE — ED Notes (Signed)
ED TO INPATIENT HANDOFF REPORT  Name/Age/Gender Mark Frey 62 y.o. male  Code Status Code Status History    Date Active Date Inactive Code Status Order ID Comments User Context   07/21/2018 1458 07/24/2018 1551 Full Code [id number]  [name], MD ED   07/03/2018 1053 07/07/2018 1645 Full Code [id number]  [name], MD Inpatient   06/12/2018 1113 06/16/2018 1542 Full Code [id number]  [name], MD Inpatient   05/22/2018 1036 05/26/2018 1644 Full Code [id number]  [name], MD Inpatient   05/01/2018 0959 05/05/2018 2317 Full Code [id number]  [name], MD Inpatient   04/10/2018 1604 04/14/2018 1640 Full Code [id number]  [name], MD Inpatient   03/10/2018 2125 03/25/2018 2126 Full Code [id number]  [name], MD Inpatient      Home/SNF/Other Home  Chief Complaint cheo card/emesis;constipation  Level of Care/Admitting Diagnosis ED Disposition    ED Disposition Condition [place]: [facility] [[id number]]  Level of Care: Telemetry [5]  Admit to tele based on following criteria: Monitor QTC interval  Diagnosis: SBO (small bowel obstruction) [facility]) [[id number]]  Admitting Physician: [name] [phone]  Attending Physician: [name] [phone]  Estimated length of stay: past midnight tomorrow  Certification:: I certify this patient will need inpatient services for at least 2 midnights  PT Class (Do Not Modify): Inpatient [101]  PT Acc Code (Do Not Modify): Private [1]       Medical History Past Medical History:  Diagnosis Date  . ALLERGIC RHINITIS   . Cancer ([place])    Lymphoma   . Diabetes mellitus   . Hyperlipidemia     Allergies Allergies  Allergen Reactions  . Ciprofloxacin Other (See Comments)    Leg tingling    IV Location/Drains/Wounds Patient Lines/Drains/Airways Status   Active Line/Drains/Airways    Name:   Placement date:   Placement time:   Site:    Days:   Implanted Port 03/27/18   03/27/18    1141    -   125   NG/OG Tube Nasogastric 14 Fr. Right nare Xray;Aucultation Documented cm marking at nare/ corner of mouth 18 cm   07/30/18    1959    Right nare   less than 1          Labs/Imaging Results for orders placed or performed during the hospital encounter of 07/30/18 (from the past 48 hour(s))  Lipase, blood     Status: None   Collection Time: 07/30/18  3:31 PM  Result Value Ref Range   Lipase 18 11 - 51 U/L    Comment: Performed at [facility], [place] [address] [place], [place] [address]  Comprehensive metabolic panel     Status: Abnormal   Collection Time: 07/30/18  3:31 PM  Result Value Ref Range   Sodium 140 135 - 145 mmol/L   Potassium 2.6 (LL) 3.5 - 5.1 mmol/L    Comment: CRITICAL RESULT CALLED TO, READ BACK BY AND VERIFIED WITH: B.JESSEE,RN 07/30/18 @1711  BY V.WILKINS    Chloride 102 98 - 111 mmol/L   CO2 27 22 - 32 mmol/L   Glucose, Bld 146 (H) 70 - 99 mg/dL   BUN 16 8 - 23 mg/dL   Creatinine, Ser 0.59 (L) 0.61 - 1.24 mg/dL   Calcium 8.5 (L) 8.9 - 10.3 mg/dL   Total Protein 6.1 (L) 6.5 - 8.1 g/dL   Albumin 3.2 (L) 3.5 - 5.0  g/dL   AST 15 15 - 41 U/L   ALT 12 0 - 44 U/L   Alkaline Phosphatase 55 38 - 126 U/L   Total Bilirubin 0.5 0.3 - 1.2 mg/dL   GFR calc non Af Amer >60 >60 mL/min   GFR calc Af Amer >60 >60 mL/min    Comment: (NOTE) The eGFR has been calculated using the CKD EPI equation. This calculation has not been validated in all clinical situations. eGFR's persistently <60 mL/min signify possible Chronic Kidney Disease.    Anion gap 11 5 - 15    Comment: Performed at [facility], [place] [address] [place], [place] [address]  CBC with Differential     Status: Abnormal   Collection Time: 07/30/18  3:31 PM  Result Value Ref Range   WBC 9.3 4.0 - 10.5 K/uL   RBC 3.14 (L) 4.22 - 5.81 MIL/uL   Hemoglobin 9.6 (L) 13.0 - 17.0 g/dL   HCT 31.8 (L) 39.0 - 52.0 %   MCV  101.3 (H) 80.0 - 100.0 fL   MCH 30.6 26.0 - 34.0 pg   MCHC 30.2 30.0 - 36.0 g/dL   RDW 18.6 (H) 11.5 - 15.5 %   Platelets 433 (H) 150 - 400 K/uL   nRBC 0.0 0.0 - 0.2 %   Neutrophils Relative % 79 %   Neutro Abs 7.3 1.7 - 7.7 K/uL   Lymphocytes Relative 6 %   Lymphs Abs 0.6 (L) 0.7 - 4.0 K/uL   Monocytes Relative 13 %   Monocytes Absolute 1.2 (H) 0.1 - 1.0 K/uL   Eosinophils Relative 0 %   Eosinophils Absolute 0.0 0.0 - 0.5 K/uL   Basophils Relative 0 %   Basophils Absolute 0.0 0.0 - 0.1 K/uL   Immature Granulocytes 2 %   Abs Immature Granulocytes 0.21 (H) 0.00 - 0.07 K/uL    Comment: Performed at [facility], [place] [address] [place], [place] [address]  Urinalysis, Routine w reflex microscopic     Status: Abnormal   Collection Time: 07/30/18  3:45 PM  Result Value Ref Range   Color, Urine YELLOW YELLOW   APPearance CLEAR CLEAR   Specific Gravity, Urine >1.046 (H) 1.005 - 1.030   pH 5.0 5.0 - 8.0   Glucose, UA NEGATIVE NEGATIVE mg/dL   Hgb urine dipstick NEGATIVE NEGATIVE   Bilirubin Urine NEGATIVE NEGATIVE   Ketones, ur 5 (A) NEGATIVE mg/dL   Protein, ur 100 (A) NEGATIVE mg/dL   Nitrite NEGATIVE NEGATIVE   Leukocytes, UA NEGATIVE NEGATIVE   RBC / HPF 0-5 0 - 5 RBC/hpf   WBC, UA 0-5 0 - 5 WBC/hpf   Bacteria, UA RARE (A) NONE SEEN   Mucus PRESENT    Hyaline Casts, UA PRESENT    Granular Casts, UA PRESENT     Comment: Performed at [facility], [place] [address] [place], [place] [address]  Magnesium     Status: Abnormal   Collection Time: 07/30/18  5:29 PM  Result Value Ref Range   Magnesium 1.3 (L) 1.7 - 2.4 mg/dL    Comment: Performed at [facility], [place] [address] [place],  [address]   Ct Abdomen Pelvis W Contrast  Result Date: 07/30/2018 CLINICAL DATA:  Abdominal distention and vomiting. Finished chemotherapy for Burkitt's lymphoma. Recent small bowel obstruction. EXAM: CT ABDOMEN AND PELVIS WITH  CONTRAST TECHNIQUE: Multidetector CT imaging of the abdomen and pelvis was performed using the standard protocol following bolus administration of  intravenous contrast. CONTRAST:  124m ISOVUE-300 IOPAMIDOL (ISOVUE-300) INJECTION 61% COMPARISON:  PET-CT dated 07/28/2018. Pelvis CT dated 07/21/2018. Abdomen CT dated 07/21/2018. FINDINGS: Lower chest: Bilateral lower lobe atelectasis. Hepatobiliary: Small cyst in the dome of the liver on the right. Additional tiny right lobe liver cyst more inferiorly. Small gallbladder Phrygian cap. Pancreas: Unremarkable. No pancreatic ductal dilatation or surrounding inflammatory changes. Spleen: Normal in size without focal abnormality. Adrenals/Urinary Tract: Normal appearing adrenal glands. Small bilateral renal cysts. 7 mm lower pole right renal calculus. No bladder or ureteral calculi and no hydronephrosis. Stomach/Bowel: Multiple significantly dilated, fluid and gas-filled loops of small bowel to the level of the terminal ileum with mildly dilated colon filled with fluid and gas, to the level of the previously demonstrated 7 cm segment of sigmoid colon with diffuse wall thickening and mild pericolonic soft tissue stranding. A diverticulum is demonstrated at the distal aspect of this abnormality. Also demonstrated is a dilated appendix containing multiple appendicoliths measuring up to 8 mm in maximum diameter each. The appendix measures 14 mm in maximum diameter. There is periappendiceal and pericolonic soft tissue stranding in that region. Vascular/Lymphatic: No significant vascular findings are present. No enlarged abdominal or pelvic lymph nodes. Reproductive: Minimally enlarged prostate gland containing coarse calcifications. Other: Small bilateral inguinal hernias containing fat. Musculoskeletal: Lumbar and lower thoracic spine degenerative changes. IMPRESSION: 1. Recurrent or persistent distal small bowel obstruction to the level of soft tissue stranding in the central  pelvis in an area where the appendix is dilated, containing multiple appendicoliths and where there is a 7 cm long segment of previously demonstrated sigmoid colon thickening and mild pericolonic soft tissue stranding. There is mild colonic dilatation compatible with partial obstruction to the level of the thickened sigmoid colon. There is also mild periappendiceal soft tissue stranding. This could all be due to post treatment changes, appendicitis, sigmoid diverticulitis or a combination of those causes. As previously mentioned, the sigmoid colon wall thickening could also be due to malignancy. 2. 7 mm nonobstructing lower pole right renal calculus. Electronically Signed   By: SClaudie ReveringM.D.   On: 07/30/2018 18:47   None  Pending Labs Unresulted Labs (From admission, onward)   None      Vitals/Pain Today's Vitals   07/30/18 1421 07/30/18 1427 07/30/18 1732 07/30/18 1938  BP: (!) 120/95  138/85 (!) 147/90  Pulse: (!) 122  (!) 113 (!) 115  Resp: 17  15 (!) 22  Temp: 98.3 F (36.8 C)  98.3 F (36.8 C) 99.1 F (37.3 C)  TempSrc: Oral  Oral Oral  SpO2: 98%  96% 95%  PainSc:  6       Isolation Precautions No active isolations  Medications Medications  potassium chloride 10 mEq in 100 mL IVPB (0 mEq Intravenous Stopped 07/30/18 1914)  iopamidol (ISOVUE-300) 61 % injection (has no administration in time range)  sodium chloride (PF) 0.9 % injection (has no administration in time range)  magnesium sulfate IVPB 2 g 50 mL (has no administration in time range)  lactated ringers bolus 1,000 mL (0 mLs Intravenous Stopped 07/30/18 1721)  prochlorperazine (COMPAZINE) injection 10 mg (10 mg Intravenous Given 07/30/18 1548)  iopamidol (ISOVUE-300) 61 % injection 100 mL (100 mLs Intravenous Contrast Given 07/30/18 1805)  lidocaine (XYLOCAINE) 2 % jelly 1 application (1 application Other Given 07/30/18 1950)    Mobility walks

## 2018-07-30 NOTE — Progress Notes (Addendum)
Pharmacy Antibiotic and Anticoagulation Note  Mark Frey is a 62 y.o. male admitted on 07/30/2018 with intra-abdominal infection.  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  F/u scr/cultures Baseline aPtt, HL and PT/INR STAT Start heparin drip at 1400 units/hr (No bolus d/t recent apixaban) Will have to use aPtt to adjust as HL is elevated d/t recent apixaban.  Check 1st aPtt 6 hours after drip started. Daily CBC/HL Goal HL = 0.3-0.7 and aPtt = 66-102 sec Baseline labs: HL=1.44, aPtt = 28 sec and PT/INR 15.1/1.20 H/H=9.6/31.8 and plts = 433  Height: 5' 9"  (175.3 cm) Weight: 219 lb 2.2 oz (99.4 kg) IBW/kg (Calculated) : 70.7  Temp (24hrs), Avg:98.6 F (37 C), Min:98.3 F (36.8 C), Max:99.1 F (37.3 C)  Recent Labs  Lab 07/24/18 0345 07/30/18 1531  WBC 8.3 9.3  CREATININE 0.53* 0.59*    Estimated Creatinine Clearance: 111.3 mL/min (A) (by C-G formula based on SCr of 0.59 mg/dL (L)).    Allergies  Allergen Reactions  . Ciprofloxacin Other (See Comments)    Leg tingling    Antimicrobials this admission: 11/17 zosyn >>    >>   Dose adjustments this admission:   Microbiology results:  BCx:   UCx:    Sputum:    MRSA PCR:   Thank you for allowing pharmacy to be a part of this patient's care.  Dorrene German 07/30/2018 9:49 PM

## 2018-07-30 NOTE — ED Provider Notes (Addendum)
[facility] DEPT Provider Note   CSN: [id number] Arrival date & time: 07/30/18  1416     History   Chief Complaint Chief Complaint  Patient presents with  . Emesis    HPI Mark Frey is a 62 y.o. male.  The history is provided by the patient.  Emesis   This is a new problem. The current episode started 3 to 5 hours ago. The problem occurs 5 to 10 times per day. The problem has been gradually improving. The emesis has an appearance of stomach contents. There has been no fever. The fever has been present for less than 1 day. Associated symptoms include abdominal pain and diarrhea. Pertinent negatives include no arthralgias, no chills, no cough, no fever, no myalgias, no sweats and no URI. Risk factors: recent SBO, patient with hx of burkitt lymphoma s/p chemotherapy.    Past Medical History:  Diagnosis Date  . ALLERGIC RHINITIS   . Cancer ([place])    Lymphoma   . Diabetes mellitus   . Hyperlipidemia     Patient Active Problem List   Diagnosis Date Noted  . Hypokalemia 07/21/2018  . SBO (small bowel obstruction) ([place]) 07/21/2018  . Diarrhea 07/21/2018  . 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 Diabetes mellitus ([place])   . Edema   . Deep vein thrombosis (DVT) of femoral vein of right lower extremity ([place])   . Counseling regarding advance care planning and goals of care 04/25/2018  . Gastrointestinal hemorrhage   . Encounter for antineoplastic chemotherapy   . 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[place]) 04/10/2018  . Port-A-Cath in place 03/27/2018  . Acute deep vein thrombosis (DVT) of femoral vein of right lower extremity ([place])   . 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 Bilateral pulmonary embolism ([place]) 03/10/2018  . Anemia 03/10/2018  . Hypoglycemia 03/10/2018  . Occult blood in stools 03/10/2018  . Abdominal mass 03/10/2018  . Tachycardia 01/31/2017  . Controlled diabetes mellitus  type II without complication ([place]) 83/15/1761  . Hypertension 06/05/2014  . Irritable bowel syndrome 03/30/2010  . Morbid obesity ([place]) 12/29/2009  . Dysmetabolic syndrome X 60/73/7106  . BACK PAIN WITH RADICULOPATHY 06/09/2007  . Hyperlipidemia 05/12/2007  . ALLERGIC RHINITIS 05/12/2007    Past Surgical History:  Procedure Laterality Date  . BIOPSY  03/15/2018   Procedure: BIOPSY;  Surgeon: Milus Banister, MD;  Location: [facility] ENDOSCOPY;  Service: Endoscopy;;  . CLEFT PALATE REPAIR    . COLONOSCOPY N/A 03/15/2018   Procedure: COLONOSCOPY;  Surgeon: Milus Banister, MD;  Location: [facility] ENDOSCOPY;  Service: Endoscopy;  Laterality: N/A;  . deviated septum repair     slight improvement  . ESOPHAGOGASTRODUODENOSCOPY N/A 03/15/2018   Procedure: ESOPHAGOGASTRODUODENOSCOPY (EGD);  Surgeon: Milus Banister, MD;  Location: [name];  Service: Endoscopy;  Laterality: N/A;  . IR IMAGING GUIDED PORT INSERTION  03/17/2018  . TONSILLECTOMY          Home Medications    Prior to Admission medications   Medication Sig Start Date End Date Taking? Authorizing Provider  apixaban (ELIQUIS) 5 MG TABS tablet Take 1 tablet (5 mg total) by mouth 2 (two) times daily. 06/16/18  Yes [name], MD  blood glucose meter kit and supplies Dispense based on patient and insurance preference. Use daily as directed. (E11.9). 03/31/18  Yes [name], MD  Choline Fenofibrate 135 MG capsule TAKE 1 CAPSULE DAILY Patient taking differently: Take  135 mg by mouth every evening.  11/29/17  Yes [name], MD  furosemide (LASIX) 20 MG tablet TAKE 1 TABLET BY MOUTH DAILY HOLD AFTER 10-15 DAYS IF LEG SWELLING RESOLVED OR IF >10LBS WEIGHT LOSS Patient taking differently: Take 20 mg by mouth daily as needed for fluid or edema.  05/29/18  Yes [name], MD  glucose blood (FREESTYLE TEST STRIPS) test strip Use to check blood sugar daily 03/30/18  Yes [name], MD  HYDROcodone-acetaminophen  (NORCO) 5-325 MG tablet Take 1-2 tablets by mouth every 4 (four) hours as needed for moderate pain. 07/17/18  Yes [name], [name], PA-C  JANUMET 50-1000 MG tablet TAKE 1 TABLET TWICE DAILY  WITH MEALS Patient taking differently: Take 1 tablet by mouth 2 (two) times daily with a meal.  11/29/17  Yes [name], MD  lidocaine-prilocaine (EMLA) cream Apply 1 application topically as needed. Patient taking differently: Apply 1 application topically daily as needed (access port).  03/27/18  Yes Truitt Merle, MD  Magnesium 500 MG CAPS Take 1 capsule (500 mg total) by mouth 2 (two) times daily. Patient taking differently: Take 500 mg by mouth every evening.  06/27/18  Yes [name], MD  senna-docusate (SENOKOT-S) 8.6-50 MG tablet Take 2 tablets by mouth at bedtime. Patient taking differently: Take 2 tablets by mouth daily as needed for mild constipation.  04/14/18  Yes [name], MD  metroNIDAZOLE (FLAGYL) 500 MG tablet Take 1 tablet (500 mg total) by mouth 3 (three) times daily. Patient not taking: Reported on 07/30/2018 07/17/18   Sandi Mealy E., PA-C  ondansetron (ZOFRAN) 8 MG tablet Take 1 tablet (8 mg total) by mouth every 8 (eight) hours as needed for nausea or vomiting. Patient not taking: Reported on 07/30/2018 05/05/18   [name], MD    Family History Family History  Problem Relation Age of Onset  . Lung cancer Mother        smoker  . Brain cancer Mother        metastasis  . AAA (abdominal aortic aneurysm) Father        smoker    Social History Social History   Tobacco Use  . Smoking status: Never Smoker  . Smokeless tobacco: Never Used  Substance Use Topics  . Alcohol use: Yes    Comment: occasional  . Drug use: No     Allergies   Ciprofloxacin   Review of Systems Review of Systems  Constitutional: Negative for chills and fever.  HENT: Negative for ear pain and sore throat.   Eyes: Negative for pain and visual disturbance.  Respiratory:  Negative for cough and shortness of breath.   Cardiovascular: Negative for chest pain and palpitations.  Gastrointestinal: Positive for abdominal pain, diarrhea and vomiting.  Genitourinary: Negative for dysuria and hematuria.  Musculoskeletal: Negative for arthralgias, back pain and myalgias.  Skin: Negative for color change and rash.  Neurological: Negative for seizures and syncope.  All other systems reviewed and are negative.    Physical Exam Updated Vital Signs  ED Triage Vitals  Enc Vitals Group     BP 07/30/18 1421 (!) 120/95     Pulse Rate 07/30/18 1421 (!) 122     Resp 07/30/18 1421 17     Temp 07/30/18 1421 98.3 F (36.8 C)     Temp Source 07/30/18 1421 Oral     SpO2 07/30/18 1421 98 %     Weight --  Height --      Head Circumference --      Peak Flow --      Pain Score 07/30/18 1427 6     Pain Loc --      Pain Edu? --      Excl. in [place]? --     Physical Exam  Constitutional: He is oriented to person, place, and time. He appears well-developed and well-nourished.  HENT:  Head: Normocephalic and atraumatic.  Eyes: Pupils are equal, round, and reactive to light. Conjunctivae and EOM are normal.  Neck: Normal range of motion. Neck supple.  Cardiovascular: Regular rhythm, normal heart sounds and intact distal pulses. Tachycardia present.  No murmur heard. Pulmonary/Chest: Effort normal and breath sounds normal. No respiratory distress.  Abdominal: He exhibits distension. He exhibits no mass. There is tenderness (mild). There is no guarding.  Musculoskeletal: Normal range of motion. He exhibits no edema.  Neurological: He is alert and oriented to person, place, and time.  Skin: Skin is warm and dry.  Psychiatric: He has a normal mood and affect.  Nursing note and vitals reviewed.    ED Treatments / Results  Labs (all labs ordered are listed, but only abnormal results are displayed) Labs Reviewed  COMPREHENSIVE METABOLIC PANEL - Abnormal; Notable for the  following components:      Result Value   Potassium 2.6 (*)    Glucose, Bld 146 (*)    Creatinine, Ser 0.59 (*)    Calcium 8.5 (*)    Total Protein 6.1 (*)    Albumin 3.2 (*)    All other components within normal limits  CBC WITH DIFFERENTIAL/PLATELET - Abnormal; Notable for the following components:   RBC 3.14 (*)    Hemoglobin 9.6 (*)    HCT 31.8 (*)    MCV 101.3 (*)    RDW 18.6 (*)    Platelets 433 (*)    Lymphs Abs 0.6 (*)    Monocytes Absolute 1.2 (*)    Abs Immature Granulocytes 0.21 (*)    All other components within normal limits  URINALYSIS, ROUTINE W REFLEX MICROSCOPIC - Abnormal; Notable for the following components:   Specific Gravity, Urine >1.046 (*)    Ketones, ur 5 (*)    Protein, ur 100 (*)    Bacteria, UA RARE (*)    All other components within normal limits  MAGNESIUM - Abnormal; Notable for the following components:   Magnesium 1.3 (*)    All other components within normal limits  LIPASE, BLOOD    EKG Sinus tachycardia, normal intervals, no ischemic changes. Radiology Ct Abdomen Pelvis W Contrast  Result Date: 07/30/2018 CLINICAL DATA:  Abdominal distention and vomiting. Finished chemotherapy for Burkitt's lymphoma. Recent small bowel obstruction. EXAM: CT ABDOMEN AND PELVIS WITH CONTRAST TECHNIQUE: Multidetector CT imaging of the abdomen and pelvis was performed using the standard protocol following bolus administration of intravenous contrast. CONTRAST:  186m ISOVUE-300 IOPAMIDOL (ISOVUE-300) INJECTION 61% COMPARISON:  PET-CT dated 07/28/2018. Pelvis CT dated 07/21/2018. Abdomen CT dated 07/21/2018. FINDINGS: Lower chest: Bilateral lower lobe atelectasis. Hepatobiliary: Small cyst in the dome of the liver on the right. Additional tiny right lobe liver cyst more inferiorly. Small gallbladder Phrygian cap. Pancreas: Unremarkable. No pancreatic ductal dilatation or surrounding inflammatory changes. Spleen: Normal in size without focal abnormality.  Adrenals/Urinary Tract: Normal appearing adrenal glands. Small bilateral renal cysts. 7 mm lower pole right renal calculus. No bladder or ureteral calculi and no hydronephrosis. Stomach/Bowel: Multiple significantly dilated, fluid and gas-filled loops  of small bowel to the level of the terminal ileum with mildly dilated colon filled with fluid and gas, to the level of the previously demonstrated 7 cm segment of sigmoid colon with diffuse wall thickening and mild pericolonic soft tissue stranding. A diverticulum is demonstrated at the distal aspect of this abnormality. Also demonstrated is a dilated appendix containing multiple appendicoliths measuring up to 8 mm in maximum diameter each. The appendix measures 14 mm in maximum diameter. There is periappendiceal and pericolonic soft tissue stranding in that region. Vascular/Lymphatic: No significant vascular findings are present. No enlarged abdominal or pelvic lymph nodes. Reproductive: Minimally enlarged prostate gland containing coarse calcifications. Other: Small bilateral inguinal hernias containing fat. Musculoskeletal: Lumbar and lower thoracic spine degenerative changes. IMPRESSION: 1. Recurrent or persistent distal small bowel obstruction to the level of soft tissue stranding in the central pelvis in an area where the appendix is dilated, containing multiple appendicoliths and where there is a 7 cm long segment of previously demonstrated sigmoid colon thickening and mild pericolonic soft tissue stranding. There is mild colonic dilatation compatible with partial obstruction to the level of the thickened sigmoid colon. There is also mild periappendiceal soft tissue stranding. This could all be due to post treatment changes, appendicitis, sigmoid diverticulitis or a combination of those causes. As previously mentioned, the sigmoid colon wall thickening could also be due to malignancy. 2. 7 mm nonobstructing lower pole right renal calculus. Electronically Signed    By: Claudie Revering M.D.   On: 07/30/2018 18:47    Procedures .Critical Care Performed by: Lennice Sites, DO Authorized by: Lennice Sites, DO   Critical care provider statement:    Critical care time (minutes):  35   Critical care was necessary to treat or prevent imminent or life-threatening deterioration of the following conditions:  Metabolic crisis and dehydration   Critical care was time spent personally by me on the following activities:  Development of treatment plan with patient or surrogate, discussions with consultants, discussions with primary provider, evaluation of patient's response to treatment, examination of patient, obtaining history from patient or surrogate, ordering and performing treatments and interventions, ordering and review of radiographic studies, ordering and review of laboratory studies, pulse oximetry, re-evaluation of patient's condition and review of old charts   I assumed direction of critical care for this patient from another provider in my specialty: no     (including critical care time)  Medications Ordered in ED Medications  potassium chloride 10 mEq in 100 mL IVPB (10 mEq Intravenous New Bag/Given 07/30/18 1734)  iopamidol (ISOVUE-300) 61 % injection (has no administration in time range)  sodium chloride (PF) 0.9 % injection (has no administration in time range)  magnesium sulfate IVPB 2 g 50 mL (has no administration in time range)  lactated ringers bolus 1,000 mL (0 mLs Intravenous Stopped 07/30/18 1721)  prochlorperazine (COMPAZINE) injection 10 mg (10 mg Intravenous Given 07/30/18 1548)  iopamidol (ISOVUE-300) 61 % injection 100 mL (100 mLs Intravenous Contrast Given 07/30/18 1805)     Initial Impression / Assessment and Plan / ED Course  I have reviewed the triage vital signs and the nursing notes.  Pertinent labs & imaging results that were available during my care of the patient were reviewed by me and considered in my medical decision  making (see chart for details).     Mark Frey is a 62 year old male with history of Burkitt lymphoma, type 2 diabetes, high cholesterol who presents to the ED with nausea, vomiting, diarrhea.  Patient with abdominal distention as well.  Patient with overall unremarkable vitals except for mild tachycardia.  Has a history of tachycardia.  Patient with recent admission due to small bowel obstruction.  Went home earlier this week.  Was doing well until he started to develop some nausea and vomiting today that was nonbloody, nonbilious.  Patient states that he has had some diarrhea.  Thinks that he passed gas earlier today.  But states that he has felt increasing distention again.  Has some mild tenderness in his abdomen on exam.  He appears distended.  Denies any urinary symptoms.  Did finish a course of antibiotics but he does not know for what.  Patient to be evaluated with blood work, CT abdomen and pelvis.  Will give IV fluid bolus, IV Compazine.  Concern for possible small bowel obstruction versus viral process versus dehydration versus electrolyte abnormality.  Will reevaluate the patient after lab work and interventions.  Patient with low magnesium and low potassium which was repleted.  Otherwise no significant leukocytosis, anemia, kidney injury.  CT scan shows small bowel obstruction.  Likely multifactorial.  Talked with Dr. Marlou Starks with general surgery and he recommends admission to medicine at this time.  No acute intervention needed.  Patient started on IV magnesium and IV potassium and IV fluids.  Patient overall is well-appearing.  No focal abdominal pain.  Patient has not passed gas yet and has had no further nausea or vomiting while under my care.  Hemodynamically stable to my care and admitted to hospitalist service in stable condition.  This chart was dictated using voice recognition software.  Despite best efforts to proofread,  errors can occur which can change the documentation  meaning.   Final Clinical Impressions(s) / ED Diagnoses   Final diagnoses:  SBO (small bowel obstruction) ([place])  Hypokalemia  Low magnesium level    ED Discharge Orders    None       Lennice Sites, DO 07/30/18 [place], [place], DO 08/05/18 1136

## 2018-07-30 NOTE — H&P (Signed)
History and Physical    Mark Frey XKG:[id number] DOB: [birth year] DOA: 07/30/2018  PCP: [name], MD  Patient coming from: Home.  Chief Complaint: Abdominal pain nausea vomiting.  HPI: Mark Frey is a 62 y.o. male with history of lymphoma status post recent chemotherapy was admitted for bowel obstruction recently discharged a week ago after being treated conservatively presents to the ER with complaints of abdominal pain and nausea vomiting.  Patient states that after discharge patient had some diarrhea and last 2 days has had minimal bowel movement.  And over the last 24 hours had large amount of vomiting.  Denies any blood in the vomitus.  Abdominal pain is generalized colicky in nature.  With abdominal distention.  Denies any fever chills.  ED Course: In the ER CT abdomen and pelvis done shows features concerning for bowel obstruction and on-call general surgeon was consulted.  Patient was placed on NG tube.  And admitted for further management.  Review of Systems: As per HPI, rest all negative.   Past Medical History:  Diagnosis Date  . ALLERGIC RHINITIS   . Cancer ([place])    Lymphoma   . Diabetes mellitus   . Hyperlipidemia     Past Surgical History:  Procedure Laterality Date  . BIOPSY  03/15/2018   Procedure: BIOPSY;  Surgeon: Milus Banister, MD;  Location: [facility];  Service: Endoscopy;;  . CLEFT PALATE REPAIR    . COLONOSCOPY N/A 03/15/2018   Procedure: COLONOSCOPY;  Surgeon: Milus Banister, MD;  Location: [facility];  Service: Endoscopy;  Laterality: N/A;  . deviated septum repair     slight improvement  . ESOPHAGOGASTRODUODENOSCOPY N/A 03/15/2018   Procedure: ESOPHAGOGASTRODUODENOSCOPY (EGD);  Surgeon: Milus Banister, MD;  Location: [name];  Service: Endoscopy;  Laterality: N/A;  . IR IMAGING GUIDED PORT INSERTION  03/17/2018  . TONSILLECTOMY       reports that he has never smoked. He has never used smokeless tobacco. He reports that he  drinks alcohol. He reports that he does not use drugs.  Allergies  Allergen Reactions  . Ciprofloxacin Other (See Comments)    Leg tingling    Family History  Problem Relation Age of Onset  . Lung cancer Mother        smoker  . Brain cancer Mother        metastasis  . AAA (abdominal aortic aneurysm) Father        smoker    Prior to Admission medications   Medication Sig Start Date End Date Taking? Authorizing Provider  apixaban (ELIQUIS) 5 MG TABS tablet Take 1 tablet (5 mg total) by mouth 2 (two) times daily. 06/16/18  Yes [name], MD  blood glucose meter kit and supplies Dispense based on patient and insurance preference. Use daily as directed. (E11.9). 03/31/18  Yes [name], MD  Choline Fenofibrate 135 MG capsule TAKE 1 CAPSULE DAILY Patient taking differently: Take 135 mg by mouth every evening.  11/29/17  Yes [name], MD  furosemide (LASIX) 20 MG tablet TAKE 1 TABLET BY MOUTH DAILY HOLD AFTER 10-15 DAYS IF LEG SWELLING RESOLVED OR IF >10LBS WEIGHT LOSS Patient taking differently: Take 20 mg by mouth daily as needed for fluid or edema.  05/29/18  Yes [name], MD  glucose blood (FREESTYLE TEST STRIPS) test strip Use to check blood sugar daily 03/30/18  Yes [name], MD  HYDROcodone-acetaminophen (NORCO) 5-325 MG tablet Take 1-2 tablets by  mouth every 4 (four) hours as needed for moderate pain. 07/17/18  Yes [name], [name], PA-C  JANUMET 50-1000 MG tablet TAKE 1 TABLET TWICE DAILY  WITH MEALS Patient taking differently: Take 1 tablet by mouth 2 (two) times daily with a meal.  11/29/17  Yes [name], MD  lidocaine-prilocaine (EMLA) cream Apply 1 application topically as needed. Patient taking differently: Apply 1 application topically daily as needed (access port).  03/27/18  Yes Truitt Merle, MD  Magnesium 500 MG CAPS Take 1 capsule (500 mg total) by mouth 2 (two) times daily. Patient taking differently: Take 500 mg by mouth  every evening.  06/27/18  Yes [name], MD  senna-docusate (SENOKOT-S) 8.6-50 MG tablet Take 2 tablets by mouth at bedtime. Patient taking differently: Take 2 tablets by mouth daily as needed for mild constipation.  04/14/18  Yes [name], MD  metroNIDAZOLE (FLAGYL) 500 MG tablet Take 1 tablet (500 mg total) by mouth 3 (three) times daily. Patient not taking: Reported on 07/30/2018 07/17/18   Sandi Mealy E., PA-C  ondansetron (ZOFRAN) 8 MG tablet Take 1 tablet (8 mg total) by mouth every 8 (eight) hours as needed for nausea or vomiting. Patient not taking: Reported on 07/30/2018 05/05/18   [name], MD    Physical Exam: Vitals:   07/30/18 1732 07/30/18 1938 07/30/18 2102 07/30/18 2119  BP: 138/85 (!) 147/90  (!) 142/89  Pulse: (!) 113 (!) 115  (!) 115  Resp: 15 (!) 22  (!) 21  Temp: 98.3 F (36.8 C) 99.1 F (37.3 C)  98.7 F (37.1 C)  TempSrc: Oral Oral  Oral  SpO2: 96% 95%  98%  Weight:   99.4 kg   Height:   _0  (1.753 m)       Constitutional: Moderately built and nourished. Vitals:   07/30/18 1732 07/30/18 1938 07/30/18 2102 07/30/18 2119  BP: 138/85 (!) 147/90  (!) 142/89  Pulse: (!) 113 (!) 115  (!) 115  Resp: 15 (!) 22  (!) 21  Temp: 98.3 F (36.8 C) 99.1 F (37.3 C)  98.7 F (37.1 C)  TempSrc: Oral Oral  Oral  SpO2: 96% 95%  98%  Weight:   99.4 kg   Height:   _1  (1.753 m)    Eyes: Anicteric no pallor. ENMT: No discharge from the ears eyes nose or mouth. Neck: No mass felt.  No neck rigidity.  No JVD appreciated. Respiratory: No rhonchi or crepitations. Cardiovascular: S1-S2 heard no murmurs appreciated. Abdomen: Mildly distended nontender no rebound tenderness guarding rigidity bowel sounds minimal. Musculoskeletal: No edema. Skin: No rash. Neurologic: Alert awake oriented to time place and person.  Moves all extremities. Psychiatric: Appears normal.   Labs on Admission: I have personally reviewed following labs and  imaging studies  CBC: Recent Labs  Lab 07/24/18 0345 07/30/18 1531  WBC 8.3 9.3  NEUTROABS  --  7.3  HGB 7.9* 9.6*  HCT 26.4* 31.8*  MCV 103.9* 101.3*  PLT 395 482*   Basic Metabolic Panel: Recent Labs  Lab 07/24/18 0345 07/30/18 1531 07/30/18 1729  NA 138 140  --   K 3.3* 2.6*  --   CL 108 102  --   CO2 23 27  --   GLUCOSE 125* 146*  --   BUN 11 16  --   CREATININE 0.53* 0.59*  --   CALCIUM 8.4* 8.5*  --   MG  --   --  1.3*  GFR: Estimated Creatinine Clearance: 111.3 mL/min (A) (by C-G formula based on SCr of 0.59 mg/dL (L)). Liver Function Tests: Recent Labs  Lab 07/30/18 1531  AST 15  ALT 12  ALKPHOS 55  BILITOT 0.5  PROT 6.1*  ALBUMIN 3.2*   Recent Labs  Lab 07/30/18 1531  LIPASE 18   No results for input(s): AMMONIA in the last 168 hours. Coagulation Profile: No results for input(s): INR, PROTIME in the last 168 hours. Cardiac Enzymes: No results for input(s): CKTOTAL, CKMB, CKMBINDEX, TROPONINI in the last 168 hours. BNP (last 3 results) No results for input(s): PROBNP in the last 8760 hours. HbA1C: No results for input(s): HGBA1C in the last 72 hours. CBG: Recent Labs  Lab 07/24/18 0729 07/24/18 1100 07/28/18 0854  GLUCAP 94 123* 103*   Lipid Profile: No results for input(s): CHOL, HDL, LDLCALC, TRIG, CHOLHDL, LDLDIRECT in the last 72 hours. Thyroid Function Tests: No results for input(s): TSH, T4TOTAL, FREET4, T3FREE, THYROIDAB in the last 72 hours. Anemia Panel: No results for input(s): VITAMINB12, FOLATE, FERRITIN, TIBC, IRON, RETICCTPCT in the last 72 hours. Urine analysis:    Component Value Date/Time   COLORURINE YELLOW 07/30/2018 [place] 07/30/2018 1545   LABSPEC >1.046 (H) 07/30/2018 1545   PHURINE 5.0 07/30/2018 1545   GLUCOSEU NEGATIVE 07/30/2018 1545   HGBUR NEGATIVE 07/30/2018 1545   HGBUR negative 01/20/2009 0840   BILIRUBINUR NEGATIVE 07/30/2018 1545   BILIRUBINUR [place] 01/30/2018 1652    KETONESUR 5 (A) 07/30/2018 1545   PROTEINUR 100 (A) 07/30/2018 1545   UROBILINOGEN 2.0 (A) 01/30/2018 1652   UROBILINOGEN 0.2 01/20/2009 0840   NITRITE NEGATIVE 07/30/2018 1545   LEUKOCYTESUR NEGATIVE 07/30/2018 1545   Sepsis Labs: _0 (procalcitonin:4,lacticidven:4) ) Recent Results (from the past 240 hour(s))  Gastrointestinal Panel by PCR , Stool     Status: None   Collection Time: 07/21/18  2:33 PM  Result Value Ref Range Status   Campylobacter species NOT DETECTED NOT DETECTED Final   Plesimonas shigelloides NOT DETECTED NOT DETECTED Final   Salmonella species NOT DETECTED NOT DETECTED Final   Yersinia enterocolitica NOT DETECTED NOT DETECTED Final   Vibrio species NOT DETECTED NOT DETECTED Final   Vibrio cholerae NOT DETECTED NOT DETECTED Final   Enteroaggregative E coli (EAEC) NOT DETECTED NOT DETECTED Final   Enteropathogenic E coli (EPEC) NOT DETECTED NOT DETECTED Final   Enterotoxigenic E coli (ETEC) NOT DETECTED NOT DETECTED Final   Shiga like toxin producing E coli (STEC) NOT DETECTED NOT DETECTED Final   Shigella/Enteroinvasive E coli (EIEC) NOT DETECTED NOT DETECTED Final   Cryptosporidium NOT DETECTED NOT DETECTED Final   Cyclospora cayetanensis NOT DETECTED NOT DETECTED Final   Entamoeba histolytica NOT DETECTED NOT DETECTED Final   Giardia lamblia NOT DETECTED NOT DETECTED Final   Adenovirus F40/41 NOT DETECTED NOT DETECTED Final   Astrovirus NOT DETECTED NOT DETECTED Final   Norovirus GI/GII NOT DETECTED NOT DETECTED Final   Rotavirus A NOT DETECTED NOT DETECTED Final   Sapovirus (I, II, IV, and V) NOT DETECTED NOT DETECTED Final    Comment: Performed at [facility], [address] [place], [place] [address]  C difficile quick scan w PCR reflex     Status: None   Collection Time: 07/21/18  2:43 PM  Result Value Ref Range Status   C Diff antigen NEGATIVE NEGATIVE Final   C Diff toxin NEGATIVE NEGATIVE Final   C Diff interpretation No C.  difficile detected.  Final  Comment: Performed at [facility], [place] [address], [place], [place] [address]     Radiological Exams on Admission: Dg Abdomen 1 View  Result Date: 07/30/2018 CLINICAL DATA:  NG tube placement EXAM: ABDOMEN - 1 VIEW COMPARISON:  CT 07/30/2018 FINDINGS: The chest was included due to the NG tube not being present in the abdomen. The NG tube coils in the upper chest near the thoracic inlet. Right Port-A-Cath in place with the tip at the cavoatrial junction. Diffuse gaseous distention of bowel. IMPRESSION: NG tube coils in the upper chest near the thoracic inlet. The tip is not visualized. Electronically Signed   By: Rolm Baptise M.D.   On: 07/30/2018 20:52   Ct Abdomen Pelvis W Contrast  Result Date: 07/30/2018 CLINICAL DATA:  Abdominal distention and vomiting. Finished chemotherapy for Burkitt's lymphoma. Recent small bowel obstruction. EXAM: CT ABDOMEN AND PELVIS WITH CONTRAST TECHNIQUE: Multidetector CT imaging of the abdomen and pelvis was performed using the standard protocol following bolus administration of intravenous contrast. CONTRAST:  14m ISOVUE-300 IOPAMIDOL (ISOVUE-300) INJECTION 61% COMPARISON:  PET-CT dated 07/28/2018. Pelvis CT dated 07/21/2018. Abdomen CT dated 07/21/2018. FINDINGS: Lower chest: Bilateral lower lobe atelectasis. Hepatobiliary: Small cyst in the dome of the liver on the right. Additional tiny right lobe liver cyst more inferiorly. Small gallbladder Phrygian cap. Pancreas: Unremarkable. No pancreatic ductal dilatation or surrounding inflammatory changes. Spleen: Normal in size without focal abnormality. Adrenals/Urinary Tract: Normal appearing adrenal glands. Small bilateral renal cysts. 7 mm lower pole right renal calculus. No bladder or ureteral calculi and no hydronephrosis. Stomach/Bowel: Multiple significantly dilated, fluid and gas-filled loops of small bowel to the level of the terminal ileum with mildly dilated  colon filled with fluid and gas, to the level of the previously demonstrated 7 cm segment of sigmoid colon with diffuse wall thickening and mild pericolonic soft tissue stranding. A diverticulum is demonstrated at the distal aspect of this abnormality. Also demonstrated is a dilated appendix containing multiple appendicoliths measuring up to 8 mm in maximum diameter each. The appendix measures 14 mm in maximum diameter. There is periappendiceal and pericolonic soft tissue stranding in that region. Vascular/Lymphatic: No significant vascular findings are present. No enlarged abdominal or pelvic lymph nodes. Reproductive: Minimally enlarged prostate gland containing coarse calcifications. Other: Small bilateral inguinal hernias containing fat. Musculoskeletal: Lumbar and lower thoracic spine degenerative changes. IMPRESSION: 1. Recurrent or persistent distal small bowel obstruction to the level of soft tissue stranding in the central pelvis in an area where the appendix is dilated, containing multiple appendicoliths and where there is a 7 cm long segment of previously demonstrated sigmoid colon thickening and mild pericolonic soft tissue stranding. There is mild colonic dilatation compatible with partial obstruction to the level of the thickened sigmoid colon. There is also mild periappendiceal soft tissue stranding. This could all be due to post treatment changes, appendicitis, sigmoid diverticulitis or a combination of those causes. As previously mentioned, the sigmoid colon wall thickening could also be due to malignancy. 2. 7 mm nonobstructing lower pole right renal calculus. Electronically Signed   By: SClaudie ReveringM.D.   On: 07/30/2018 18:47    EKG: Independently reviewed.  Sinus tachycardia.  Assessment/Plan Principal Problem:   SBO (small bowel obstruction) (HCC) Active Problems:   Hypertension   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HCC)   Deep vein thrombosis (DVT) of femoral vein of right lower extremity  (HCC)   Hypokalemia   Low magnesium level    1. Small bowel obstruction -discussed  with Dr. Marlou Starks on-call general surgeon.  Since there is some intermittent changes around the sigmoid colon and around the appendix Dr. Marlou Starks advised and antibiotics which I have placed patient on Zosyn.  NG tube for suction.  Repeat x-rays in the morning for now n.p.o. and pain relief medications and fluids. 2. Severe hypokalemia and hypomagnesemia likely from vomiting -replace and recheck. 3. Diabetes mellitus type 2 we will keep patient on sliding scale coverage. 4. 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  We will keep patient on heparin for now. 5. Lymphoma being followed by oncologist.  May need oncology input. 6. Anemia likely from chemotherapy follow CBC.   DVT prophylaxis: SCDs. Code Status: Full code. Family Communication: Discussed with patient. Disposition Plan: Home. Consults called: General surgery. Admission status: Inpatient.   [name] MD [facility] Pager [phone].  If 7PM-7AM, please contact night-coverage [email or web address] Password [facility]  07/30/2018, 9:34 PM

## 2018-07-30 NOTE — ED Notes (Signed)
Date and time results received: 07/30/18 1712 (use smartphrase ".now" to insert current time)  Test: potassium Critical Value: 2.6  Name of Provider Notified: Dr. Ronnald Nian  Orders Received? Or Actions Taken?: Actions Taken: notified Dr Ronnald Nian

## 2018-07-30 NOTE — ED Triage Notes (Signed)
Pt was recently hospitalized for a small bowel obstruction and released on Monday. Pt reports he has had diarrhea but that stopped on Thursday and has not had any real BM since then. Pt reports bloating and emesis.

## 2018-07-30 NOTE — Consult Note (Signed)
Reason for Consult:vomiting Referring Physician: Dr. Jenny Reichmann is an 62 y.o. male.  HPI: The patient is a 62 year old white male who was recently admitted to the hospital with a small bowel obstruction that seem to spontaneously resolve with bowel rest.  He has been recently treated for lymphoma.  When he went home last week he had significant diarrhea for several days and then quit having bowel movements altogether over the last 2 days.  He did have some nausea and vomiting today as well.  He came back to the emergency department where a CT scan shows evidence of bowel obstruction where there is some thickening of the colon.  It is difficult to tell whether this is left over from the treatment of his lymphoma or could this be infectious or inflammatory in nature.  Past Medical History:  Diagnosis Date  . ALLERGIC RHINITIS   . Cancer ([place])    Lymphoma   . Diabetes mellitus   . Hyperlipidemia     Past Surgical History:  Procedure Laterality Date  . BIOPSY  03/15/2018   Procedure: BIOPSY;  Surgeon: Milus Banister, MD;  Location: [facility];  Service: Endoscopy;;  . CLEFT PALATE REPAIR    . COLONOSCOPY N/A 03/15/2018   Procedure: COLONOSCOPY;  Surgeon: Milus Banister, MD;  Location: [facility];  Service: Endoscopy;  Laterality: N/A;  . deviated septum repair     slight improvement  . ESOPHAGOGASTRODUODENOSCOPY N/A 03/15/2018   Procedure: ESOPHAGOGASTRODUODENOSCOPY (EGD);  Surgeon: Milus Banister, MD;  Location: [name];  Service: Endoscopy;  Laterality: N/A;  . IR IMAGING GUIDED PORT INSERTION  03/17/2018  . TONSILLECTOMY      Family History  Problem Relation Age of Onset  . Lung cancer Mother        smoker  . Brain cancer Mother        metastasis  . AAA (abdominal aortic aneurysm) Father        smoker    Social History:  reports that he has never smoked. He has never used smokeless tobacco. He reports that he drinks alcohol. He reports that he does not  use drugs.  Allergies:  Allergies  Allergen Reactions  . Ciprofloxacin Other (See Comments)    Leg tingling    Medications: I have reviewed the patient's current medications.  Results for orders placed or performed during the hospital encounter of 07/30/18 (from the past 48 hour(s))  Lipase, blood     Status: None   Collection Time: 07/30/18  3:31 PM  Result Value Ref Range   Lipase 18 11 - 51 U/L    Comment: Performed at [facility], [place] [address] [place], [place] [address]  Comprehensive metabolic panel     Status: Abnormal   Collection Time: 07/30/18  3:31 PM  Result Value Ref Range   Sodium 140 135 - 145 mmol/L   Potassium 2.6 (LL) 3.5 - 5.1 mmol/L    Comment: CRITICAL RESULT CALLED TO, READ BACK BY AND VERIFIED WITH: B.JESSEE,RN 07/30/18 _0  BY V.WILKINS    Chloride 102 98 - 111 mmol/L   CO2 27 22 - 32 mmol/L   Glucose, Bld 146 (H) 70 - 99 mg/dL   BUN 16 8 - 23 mg/dL   Creatinine, Ser 0.59 (L) 0.61 - 1.24 mg/dL   Calcium 8.5 (L) 8.9 - 10.3 mg/dL   Total Protein 6.1 (L) 6.5 - 8.1 g/dL   Albumin 3.2 (L) 3.5 - 5.0 g/dL   AST  15 15 - 41 U/L   ALT 12 0 - 44 U/L   Alkaline Phosphatase 55 38 - 126 U/L   Total Bilirubin 0.5 0.3 - 1.2 mg/dL   GFR calc non Af Amer >60 >60 mL/min   GFR calc Af Amer >60 >60 mL/min    Comment: (NOTE) The eGFR has been calculated using the CKD EPI equation. This calculation has not been validated in all clinical situations. eGFR's persistently <60 mL/min signify possible Chronic Kidney Disease.    Anion gap 11 5 - 15    Comment: Performed at [facility], [place] [address] [place], [place] [address]  CBC with Differential     Status: Abnormal   Collection Time: 07/30/18  3:31 PM  Result Value Ref Range   WBC 9.3 4.0 - 10.5 K/uL   RBC 3.14 (L) 4.22 - 5.81 MIL/uL   Hemoglobin 9.6 (L) 13.0 - 17.0 g/dL   HCT 31.8 (L) 39.0 - 52.0 %   MCV 101.3 (H) 80.0 - 100.0 fL   MCH 30.6 26.0 - 34.0 pg   MCHC 30.2  30.0 - 36.0 g/dL   RDW 18.6 (H) 11.5 - 15.5 %   Platelets 433 (H) 150 - 400 K/uL   nRBC 0.0 0.0 - 0.2 %   Neutrophils Relative % 79 %   Neutro Abs 7.3 1.7 - 7.7 K/uL   Lymphocytes Relative 6 %   Lymphs Abs 0.6 (L) 0.7 - 4.0 K/uL   Monocytes Relative 13 %   Monocytes Absolute 1.2 (H) 0.1 - 1.0 K/uL   Eosinophils Relative 0 %   Eosinophils Absolute 0.0 0.0 - 0.5 K/uL   Basophils Relative 0 %   Basophils Absolute 0.0 0.0 - 0.1 K/uL   Immature Granulocytes 2 %   Abs Immature Granulocytes 0.21 (H) 0.00 - 0.07 K/uL    Comment: Performed at [facility], [place] [address] [place], [place] [address]  Urinalysis, Routine w reflex microscopic     Status: Abnormal   Collection Time: 07/30/18  3:45 PM  Result Value Ref Range   Color, Urine YELLOW YELLOW   APPearance CLEAR CLEAR   Specific Gravity, Urine >1.046 (H) 1.005 - 1.030   pH 5.0 5.0 - 8.0   Glucose, UA NEGATIVE NEGATIVE mg/dL   Hgb urine dipstick NEGATIVE NEGATIVE   Bilirubin Urine NEGATIVE NEGATIVE   Ketones, ur 5 (A) NEGATIVE mg/dL   Protein, ur 100 (A) NEGATIVE mg/dL   Nitrite NEGATIVE NEGATIVE   Leukocytes, UA NEGATIVE NEGATIVE   RBC / HPF 0-5 0 - 5 RBC/hpf   WBC, UA 0-5 0 - 5 WBC/hpf   Bacteria, UA RARE (A) NONE SEEN   Mucus PRESENT    Hyaline Casts, UA PRESENT    Granular Casts, UA PRESENT     Comment: Performed at [facility], [place] [address], [place], [place] [address]  Magnesium     Status: Abnormal   Collection Time: 07/30/18  5:29 PM  Result Value Ref Range   Magnesium 1.3 (L) 1.7 - 2.4 mg/dL    Comment: Performed at [facility], [place] [address], [place], [place] [address]    Ct Abdomen Pelvis W Contrast  Result Date: 07/30/2018 CLINICAL DATA:  Abdominal distention and vomiting. Finished chemotherapy for Burkitt's lymphoma. Recent small bowel obstruction. EXAM: CT ABDOMEN AND PELVIS WITH CONTRAST TECHNIQUE: Multidetector CT imaging of the abdomen and pelvis  was performed using the standard protocol following bolus administration of intravenous contrast. CONTRAST:  168m ISOVUE-300 IOPAMIDOL (ISOVUE-300) INJECTION 61% COMPARISON:  PET-CT dated 07/28/2018. Pelvis CT dated 07/21/2018. Abdomen CT dated 07/21/2018. FINDINGS: Lower chest: Bilateral lower lobe atelectasis. Hepatobiliary: Small cyst in the dome of the liver on the right. Additional tiny right lobe liver cyst more inferiorly. Small gallbladder Phrygian cap. Pancreas: Unremarkable. No pancreatic ductal dilatation or surrounding inflammatory changes. Spleen: Normal in size without focal abnormality. Adrenals/Urinary Tract: Normal appearing adrenal glands. Small bilateral renal cysts. 7 mm lower pole right renal calculus. No bladder or ureteral calculi and no hydronephrosis. Stomach/Bowel: Multiple significantly dilated, fluid and gas-filled loops of small bowel to the level of the terminal ileum with mildly dilated colon filled with fluid and gas, to the level of the previously demonstrated 7 cm segment of sigmoid colon with diffuse wall thickening and mild pericolonic soft tissue stranding. A diverticulum is demonstrated at the distal aspect of this abnormality. Also demonstrated is a dilated appendix containing multiple appendicoliths measuring up to 8 mm in maximum diameter each. The appendix measures 14 mm in maximum diameter. There is periappendiceal and pericolonic soft tissue stranding in that region. Vascular/Lymphatic: No significant vascular findings are present. No enlarged abdominal or pelvic lymph nodes. Reproductive: Minimally enlarged prostate gland containing coarse calcifications. Other: Small bilateral inguinal hernias containing fat. Musculoskeletal: Lumbar and lower thoracic spine degenerative changes. IMPRESSION: 1. Recurrent or persistent distal small bowel obstruction to the level of soft tissue stranding in the central pelvis in an area where the appendix is dilated, containing multiple  appendicoliths and where there is a 7 cm long segment of previously demonstrated sigmoid colon thickening and mild pericolonic soft tissue stranding. There is mild colonic dilatation compatible with partial obstruction to the level of the thickened sigmoid colon. There is also mild periappendiceal soft tissue stranding. This could all be due to post treatment changes, appendicitis, sigmoid diverticulitis or a combination of those causes. As previously mentioned, the sigmoid colon wall thickening could also be due to malignancy. 2. 7 mm nonobstructing lower pole right renal calculus. Electronically Signed   By: SClaudie ReveringM.D.   On: 07/30/2018 18:47    Review of Systems  Constitutional: Positive for malaise/fatigue.  HENT: Negative.   Eyes: Negative.   Respiratory: Negative.   Cardiovascular: Negative.   Gastrointestinal: Positive for abdominal pain, diarrhea, nausea and vomiting.  Genitourinary: Negative.   Musculoskeletal: Negative.   Skin: Negative.   Neurological: Negative.   Endo/Heme/Allergies: Negative.   Psychiatric/Behavioral: Negative.    Blood pressure (!) 147/90, pulse (!) 115, temperature 99.1 F (37.3 C), temperature source Oral, resp. rate (!) 22, SpO2 95 %. Physical Exam  Constitutional: He is oriented to person, place, and time. He appears well-developed and well-nourished. No distress.  HENT:  Head: Normocephalic and atraumatic.  Mouth/Throat: No oropharyngeal exudate.  Eyes: Pupils are equal, round, and reactive to light. Conjunctivae and EOM are normal.  Neck: Normal range of motion. Neck supple.  Cardiovascular: Normal rate, regular rhythm and normal heart sounds.  Respiratory: Effort normal and breath sounds normal. No stridor. No respiratory distress.  GI: Soft. He exhibits distension. There is tenderness.  Musculoskeletal: Normal range of motion. He exhibits no edema or tenderness.  Neurological: He is alert and oriented to person, place, and time. Coordination  normal.  Skin: Skin is warm and dry. No erythema.  Psychiatric: He has a normal mood and affect. His behavior is normal. Thought content normal.    Assessment/Plan: The patient appears to have a recurrent small bowel obstruction.  At this  point I would recommend NG tube decompression and bowel rest with IV hydration.  I would start him on IV antibiotics for the inflammatory change around the colon.  If he does not improve over the next couple days then he may require exploration.  Autumn Messing III 07/30/2018, 8:31 PM

## 2018-07-30 NOTE — ED Notes (Signed)
Pt would like blood drawn from port

## 2018-07-31 ENCOUNTER — Inpatient Hospital Stay (HOSPITAL_COMMUNITY): Payer: BLUE CROSS/BLUE SHIELD

## 2018-07-31 ENCOUNTER — Telehealth: Payer: Self-pay | Admitting: *Deleted

## 2018-07-31 LAB — HEPATIC FUNCTION PANEL
ALT: 11 U/L (ref 0–44)
AST: 12 U/L — AB (ref 15–41)
Albumin: 2.9 g/dL — ABNORMAL LOW (ref 3.5–5.0)
Alkaline Phosphatase: 48 U/L (ref 38–126)
BILIRUBIN INDIRECT: 0.5 mg/dL (ref 0.3–0.9)
Bilirubin, Direct: 0.1 mg/dL (ref 0.0–0.2)
TOTAL PROTEIN: 5.4 g/dL — AB (ref 6.5–8.1)
Total Bilirubin: 0.6 mg/dL (ref 0.3–1.2)

## 2018-07-31 LAB — CBC WITH DIFFERENTIAL/PLATELET
ABS IMMATURE GRANULOCYTES: 0.21 10*3/uL — AB (ref 0.00–0.07)
BASOS ABS: 0 10*3/uL (ref 0.0–0.1)
BASOS PCT: 0 %
Eosinophils Absolute: 0 10*3/uL (ref 0.0–0.5)
Eosinophils Relative: 0 %
HCT: 28.3 % — ABNORMAL LOW (ref 39.0–52.0)
Hemoglobin: 8.5 g/dL — ABNORMAL LOW (ref 13.0–17.0)
IMMATURE GRANULOCYTES: 2 %
LYMPHS ABS: 0.6 10*3/uL — AB (ref 0.7–4.0)
Lymphocytes Relative: 6 %
MCH: 30.7 pg (ref 26.0–34.0)
MCHC: 30 g/dL (ref 30.0–36.0)
MCV: 102.2 fL — AB (ref 80.0–100.0)
MONOS PCT: 13 %
Monocytes Absolute: 1.2 10*3/uL — ABNORMAL HIGH (ref 0.1–1.0)
NEUTROS ABS: 7.3 10*3/uL (ref 1.7–7.7)
NEUTROS PCT: 79 %
NRBC: 0 % (ref 0.0–0.2)
PLATELETS: 396 10*3/uL (ref 150–400)
RBC: 2.77 MIL/uL — ABNORMAL LOW (ref 4.22–5.81)
RDW: 18.7 % — ABNORMAL HIGH (ref 11.5–15.5)
WBC: 9.3 10*3/uL (ref 4.0–10.5)

## 2018-07-31 LAB — PROTIME-INR
INR: 1.2
PROTHROMBIN TIME: 15.1 s (ref 11.4–15.2)

## 2018-07-31 LAB — BASIC METABOLIC PANEL
Anion gap: 10 (ref 5–15)
BUN: 12 mg/dL (ref 8–23)
CO2: 30 mmol/L (ref 22–32)
Calcium: 8.4 mg/dL — ABNORMAL LOW (ref 8.9–10.3)
Chloride: 102 mmol/L (ref 98–111)
Creatinine, Ser: 0.52 mg/dL — ABNORMAL LOW (ref 0.61–1.24)
GFR calc Af Amer: >60 mL/min (ref >60)
Glucose, Bld: 129 mg/dL — ABNORMAL HIGH (ref 70–99)
POTASSIUM: 3 mmol/L — AB (ref 3.5–5.1)
SODIUM: 142 mmol/L (ref 135–145)

## 2018-07-31 LAB — HEPARIN LEVEL (UNFRACTIONATED)
HEPARIN UNFRACTIONATED: 1.86 [IU]/mL — AB (ref 0.30–0.70)
Heparin Unfractionated: 1.44 IU/mL — ABNORMAL HIGH (ref 0.30–0.70)
Heparin Unfractionated: 1.86 IU/mL — ABNORMAL HIGH (ref 0.30–0.70)

## 2018-07-31 LAB — APTT
APTT: 36 s (ref 24–36)
APTT: 58 s — AB (ref 24–36)
aPTT: 28 seconds (ref 24–36)

## 2018-07-31 LAB — GLUCOSE, CAPILLARY
GLUCOSE-CAPILLARY: 91 mg/dL (ref 70–99)
GLUCOSE-CAPILLARY: 96 mg/dL (ref 70–99)
Glucose-Capillary: 102 mg/dL — ABNORMAL HIGH (ref 70–99)
Glucose-Capillary: 120 mg/dL — ABNORMAL HIGH (ref 70–99)
Glucose-Capillary: 100 mg/dL — ABNORMAL HIGH (ref 70–99)

## 2018-07-31 LAB — MAGNESIUM: Magnesium: 1.7 mg/dL (ref 1.7–2.4)

## 2018-07-31 MED ORDER — HEPARIN (PORCINE) 25000 UT/250ML-% IV SOLN
1950.0000 [IU]/h | INTRAVENOUS | Status: DC
  Administered 2018-07-31: 1950 [IU]/h via INTRAVENOUS

## 2018-07-31 MED ORDER — DIATRIZOATE MEGLUMINE & SODIUM 66-10 % PO SOLN
90.0000 mL | Freq: Once | ORAL | Status: AC
  Administered 2018-07-31: 90 mL via NASOGASTRIC
  Filled 2018-07-31: qty 90

## 2018-07-31 MED ORDER — MAGNESIUM SULFATE 2 GM/50ML IV SOLN
2.0000 g | Freq: Once | INTRAVENOUS | Status: AC
  Administered 2018-07-31: 2 g via INTRAVENOUS
  Filled 2018-07-31: qty 50

## 2018-07-31 MED ORDER — SODIUM CHLORIDE 0.9% FLUSH
10.0000 mL | INTRAVENOUS | Status: DC | PRN
  Administered 2018-08-01 (×2): 10 mL
  Administered 2018-08-04: 20 mL
  Filled 2018-07-31 (×3): qty 40

## 2018-07-31 MED ORDER — HEPARIN (PORCINE) 25000 UT/250ML-% IV SOLN
1400.0000 [IU]/h | INTRAVENOUS | Status: DC
  Administered 2018-07-31: 1400 [IU]/h via INTRAVENOUS
  Filled 2018-07-31: qty 250

## 2018-07-31 MED ORDER — INSULIN ASPART 100 UNIT/ML ~~LOC~~ SOLN
0.0000 [IU] | SUBCUTANEOUS | Status: DC
  Administered 2018-08-04: 1 [IU] via SUBCUTANEOUS

## 2018-07-31 MED ORDER — HEPARIN (PORCINE) 25000 UT/250ML-% IV SOLN
1800.0000 [IU]/h | INTRAVENOUS | Status: DC
  Administered 2018-07-31: 1800 [IU]/h via INTRAVENOUS
  Filled 2018-07-31: qty 250

## 2018-07-31 MED ORDER — POTASSIUM CHLORIDE 10 MEQ/100ML IV SOLN
10.0000 meq | INTRAVENOUS | Status: AC
  Administered 2018-07-31 (×2): 10 meq via INTRAVENOUS
  Filled 2018-07-31 (×2): qty 100

## 2018-07-31 MED ORDER — PIPERACILLIN-TAZOBACTAM 3.375 G IVPB
3.3750 g | Freq: Three times a day (TID) | INTRAVENOUS | Status: DC
  Administered 2018-07-31 – 2018-08-03 (×10): 3.375 g via INTRAVENOUS
  Filled 2018-07-31 (×10): qty 50

## 2018-07-31 MED ORDER — POTASSIUM CHLORIDE 10 MEQ/100ML IV SOLN
10.0000 meq | INTRAVENOUS | Status: AC
  Administered 2018-07-31 (×4): 10 meq via INTRAVENOUS
  Filled 2018-07-31 (×4): qty 100

## 2018-07-31 NOTE — Progress Notes (Signed)
Brief Pharmacy Note:  63 y/oM on IV heparin infusion for history of VTE while apixaban on hold. Patient admitted for SBO.   CBC: Hgb 8.5, Pltc WNL  1652 heparin level = 1.86, remains elevated as expected due to recent Apixaban use  1652 aPTT = 58 seconds, SUBtherapeutic on heparin infusion at 1800 units/hr  No bleeding or infusion issues noted per nursing   Plan:  Continue to adjust heparin infusion using aPTT for now, until heparin level and aPTT correlate.   Increase heparin infusion to 1950 units/hr  aPTT and heparin level 6 hours after rate change  Daily CBC and heparin level  Monitor for s/sx of bleeding   Lindell Spar, PharmD, BCPS Pager: [phone] 07/31/2018 7:32 PM

## 2018-07-31 NOTE — Progress Notes (Signed)
PHARMACY NOTE -  [facility] has been assisting with dosing of Zosyn for IAI.  Dosage remains stable at 3.375 g IV q8 hr and need for further dosage adjustment appears unlikely at present given stable renal function  Pharmacy will sign off, following peripherally for culture results or dose adjustments. Please reconsult if a change in clinical status warrants re-evaluation of dosage.  Reuel Boom, PharmD, BCPS [phone] 07/31/2018, 7:43 AM

## 2018-07-31 NOTE — Progress Notes (Signed)
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 The thickening is circumferential and obscures the lumen at this level. Differential include new lymphoma within the proximal sigmoid colon versus diverticulitis versus colitis related to immunotherapy. - CT scan showed dilated appendix with multiple appendicoliths. Mild periappendiceal soft tissue stranding. 7cm long segment of sigmoid thickening with mild periocolonic soft tissue stranding.  - etiology of diverticulitis vs appendicitis vs post treatment changes vs new malignancy - due to recent admission we will do the small bowel protocol with gastrografin even in the setting of pt's recent BM's.   FEN: NPO, NGT VTE: SCD's, heparin ID: Zosyn 11/17>> Foley: none Follow up: TBD  DISPO: small bowel protocol, continue IV Zosyn, continue NGT, would recommend oncology weigh in due to recent PET scan. I spoke to wife    LOS: 1 day    Subjective: CC: SBO  Pt had several episodes of watery diarrhea today. No flatus. Wife is concerned that he was not improved since last discharge and she does not want to rush things. She states she will reach out to pt's oncologist about results of PET scan. Pt states no abdominal pain today.   Objective: Vital signs in last 24 hours: Temp:  [98.1 F (36.7 C)-99.1 F (37.3 C)] 98.1 F (36.7 C) (11/18 0530) Pulse Rate:  [103-122] 103 (11/18 0530) Resp:  [15-22] 17 (11/18 0530) BP: (120-147)/(85-95) 132/87 (11/18 0530) SpO2:  [95 %-98 %] 96 % (11/18 0530) Weight:  [99.4 kg] 99.4 kg (11/17 2102) Last BM Date: 07/30/18  Intake/Output from  previous day: 11/17 0701 - 11/18 0700 In: 1202.7 [I.V.:572.5; IV Piggyback:630.3] Out: 1300 [Urine:200; Emesis/NG output:1100] Intake/Output this shift: No intake/output data recorded.  PE: Gen:  Alert, NAD, pleasant, cooperative Pulm:  Rate and effort normal Abd: Soft, obese, mild distention, NT, no BS appreciated Skin: no rashes noted, warm and dry   Anti-infectives: Anti-infectives (From admission, onward)   Start     Dose/Rate Route Frequency Ordered Stop   07/31/18 0800  piperacillin-tazobactam (ZOSYN) IVPB 3.375 g     3.375 g 12.5 mL/hr over 240 Minutes Intravenous Every 8 hours 07/31/18 0231     07/30/18 2200  piperacillin-tazobactam (ZOSYN) IVPB 3.375 g     3.375 g 100 mL/hr over 30 Minutes Intravenous  Once 07/30/18 2149 07/31/18 0200      Lab Results:  Recent Labs    07/30/18 1531 07/31/18 0434  WBC 9.3 9.3  HGB 9.6* 8.5*  HCT 31.8* 28.3*  PLT 433* 396   BMET Recent Labs    07/30/18 1531 07/31/18 0434  NA 140 142  K 2.6* 3.0*  CL 102 102  CO2 27 30  GLUCOSE 146* 129*  BUN 16 12  CREATININE 0.59* 0.52*  CALCIUM 8.5* 8.4*   PT/INR Recent Labs    07/30/18 2353  LABPROT 15.1  INR 1.20   CMP     Component Value Date/Time   NA 142 07/31/2018 0434   K 3.0 (L) 07/31/2018 0434   CL 102 07/31/2018 0434   CO2 30 07/31/2018 0434   GLUCOSE 129 (H) 07/31/2018 0434   BUN 12 07/31/2018 0434  CREATININE 0.52 (L) 07/31/2018 0434   CREATININE 0.89 07/17/2018 1150   CREATININE 1.26 12/01/2012 1700   CALCIUM 8.4 (L) 07/31/2018 0434   PROT 5.4 (L) 07/31/2018 0434   ALBUMIN 2.9 (L) 07/31/2018 0434   AST 12 (L) 07/31/2018 0434   AST 13 (L) 07/17/2018 1150   ALT 11 07/31/2018 0434   ALT 15 07/17/2018 1150   ALKPHOS 48 07/31/2018 0434   BILITOT 0.6 07/31/2018 0434   BILITOT 0.3 07/17/2018 1150   GFRNONAA >60 07/31/2018 0434   GFRNONAA >60 07/17/2018 1150   GFRAA >60 07/31/2018 0434   GFRAA >60 07/17/2018 1150   Lipase     Component Value  Date/Time   LIPASE 18 07/30/2018 1531    Studies/Results: Dg Abdomen 1 View  Result Date: 07/30/2018 CLINICAL DATA:  NG tube placement EXAM: ABDOMEN - 1 VIEW COMPARISON:  CT 07/30/2018 FINDINGS: The chest was included due to the NG tube not being present in the abdomen. The NG tube coils in the upper chest near the thoracic inlet. Right Port-A-Cath in place with the tip at the cavoatrial junction. Diffuse gaseous distention of bowel. IMPRESSION: NG tube coils in the upper chest near the thoracic inlet. The tip is not visualized. Electronically Signed   By: Rolm Baptise M.D.   On: 07/30/2018 20:52   Ct Abdomen Pelvis W Contrast  Result Date: 07/30/2018 CLINICAL DATA:  Abdominal distention and vomiting. Finished chemotherapy for Burkitt's lymphoma. Recent small bowel obstruction. EXAM: CT ABDOMEN AND PELVIS WITH CONTRAST TECHNIQUE: Multidetector CT imaging of the abdomen and pelvis was performed using the standard protocol following bolus administration of intravenous contrast. CONTRAST:  154m ISOVUE-300 IOPAMIDOL (ISOVUE-300) INJECTION 61% COMPARISON:  PET-CT dated 07/28/2018. Pelvis CT dated 07/21/2018. Abdomen CT dated 07/21/2018. FINDINGS: Lower chest: Bilateral lower lobe atelectasis. Hepatobiliary: Small cyst in the dome of the liver on the right. Additional tiny right lobe liver cyst more inferiorly. Small gallbladder Phrygian cap. Pancreas: Unremarkable. No pancreatic ductal dilatation or surrounding inflammatory changes. Spleen: Normal in size without focal abnormality. Adrenals/Urinary Tract: Normal appearing adrenal glands. Small bilateral renal cysts. 7 mm lower pole right renal calculus. No bladder or ureteral calculi and no hydronephrosis. Stomach/Bowel: Multiple significantly dilated, fluid and gas-filled loops of small bowel to the level of the terminal ileum with mildly dilated colon filled with fluid and gas, to the level of the previously demonstrated 7 cm segment of sigmoid colon with  diffuse wall thickening and mild pericolonic soft tissue stranding. A diverticulum is demonstrated at the distal aspect of this abnormality. Also demonstrated is a dilated appendix containing multiple appendicoliths measuring up to 8 mm in maximum diameter each. The appendix measures 14 mm in maximum diameter. There is periappendiceal and pericolonic soft tissue stranding in that region. Vascular/Lymphatic: No significant vascular findings are present. No enlarged abdominal or pelvic lymph nodes. Reproductive: Minimally enlarged prostate gland containing coarse calcifications. Other: Small bilateral inguinal hernias containing fat. Musculoskeletal: Lumbar and lower thoracic spine degenerative changes. IMPRESSION: 1. Recurrent or persistent distal small bowel obstruction to the level of soft tissue stranding in the central pelvis in an area where the appendix is dilated, containing multiple appendicoliths and where there is a 7 cm long segment of previously demonstrated sigmoid colon thickening and mild pericolonic soft tissue stranding. There is mild colonic dilatation compatible with partial obstruction to the level of the thickened sigmoid colon. There is also mild periappendiceal soft tissue stranding. This could all be due to post treatment changes, appendicitis, sigmoid diverticulitis or  a combination of those causes. As previously mentioned, the sigmoid colon wall thickening could also be due to malignancy. 2. 7 mm nonobstructing lower pole right renal calculus. Electronically Signed   By: Claudie Revering M.D.   On: 07/30/2018 18:47      Kalman Drape , [facility] 07/31/2018, 10:15 AM  Pager: [phone] Mon-Wed, Friday 7:00am-4:30pm Thurs 7am-11:30am  Consults: [phone]

## 2018-07-31 NOTE — Progress Notes (Signed)
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 -his symptom has improved with ng suction -general surgery following , will follow general surgery recommendation  Burkitt lymphoma  -status post cycle 6 of EPOCH-R dosed on 07/03/2018   Severe hypokalemia/hypomagnesemia: Replace k/mag  Macrocytic anemia in the setting of malignancy: hgb above 8  noninsulin dependent dm2, controlled -home oral meds held -on ssi here  H/o DVT/PE -on apixaban at home, currently on heparin drip  Obesity: Body mass index is 32.36 kg/m.    Code Status: full  Family Communication: patient and wife at bedside  Disposition Plan: not ready to discharge   Consultants:  General surgery  Procedures:  Ng placement  Antibiotics:  zosyn   Objective: BP 140/90 (BP Location: Left Arm)   Pulse (!) 106   Temp 97.7 F (36.5 C) (Oral)   Resp 20   Ht 5' 9"  (1.753 m)   Wt 99.4 kg   SpO2 97%   BMI 32.36 kg/m   Intake/Output Summary (Last 24 hours) at 07/31/2018 1825 Last data filed at 07/31/2018 1501 Gross per 24 hour  Intake 2297.37 ml  Output 1300 ml  Net 997.37 ml   Filed Weights   07/30/18 2102  Weight: 99.4 kg    Exam: Patient is examined daily including today on 07/31/2018, exams remain the same as of yesterday except that has  changed    General:  Pale, NAD  Cardiovascular: RRR  Respiratory: CTABL  Abdomen: Soft/ND/NT, positive BS  Musculoskeletal: No Edema  Neuro: alert, oriented   Data Reviewed: Basic Metabolic Panel: Recent Labs  Lab 07/30/18 1531 07/30/18 1729 07/31/18 0434  NA 140  --  142  K 2.6*  --  3.0*  CL 102  --  102  CO2 27  --  30  GLUCOSE 146*  --  129*  BUN 16  --  12  CREATININE 0.59*  --  0.52*  CALCIUM 8.5*  --  8.4*  MG  --  1.3* 1.7   Liver Function Tests: Recent Labs  Lab 07/30/18 1531 07/31/18 0434  AST 15 12*  ALT 12 11  ALKPHOS 55 48  BILITOT 0.5 0.6  PROT 6.1* 5.4*  ALBUMIN 3.2* 2.9*   Recent Labs  Lab 07/30/18 1531  LIPASE 18   No results for input(s): AMMONIA in the last 168 hours. CBC: Recent Labs  Lab 07/30/18 1531 07/31/18 0434  WBC 9.3 9.3  NEUTROABS 7.3 7.3  HGB 9.6* 8.5*  HCT 31.8* 28.3*  MCV 101.3* 102.2*  PLT 433* 396   Cardiac Enzymes:   No results for input(s): CKTOTAL, CKMB, CKMBINDEX, TROPONINI in the last 168 hours. BNP (last 3 results) No results for input(s): BNP in the last 8760 hours.  ProBNP (last 3 results) No results for input(s): PROBNP in the last 8760 hours.  CBG: Recent Labs  Lab 07/28/18 0854 07/30/18 2201 07/31/18 0227 07/31/18 0626 07/31/18 1132  GLUCAP 103* 124* 102* 120* 100*    No results found for this or any previous visit (from the past 240 hour(s)).   Studies: Dg Abdomen 1 View  Result Date: 07/30/2018 CLINICAL DATA:  NG tube placement EXAM: ABDOMEN - 1 VIEW COMPARISON:  CT 07/30/2018 FINDINGS: The chest was included due to the NG tube not being present in the abdomen. The NG tube coils in the upper chest near the thoracic inlet. Right Port-A-Cath in place with the tip at the cavoatrial junction. Diffuse gaseous distention of bowel. IMPRESSION: NG tube coils in the upper chest near the thoracic inlet. The tip is not visualized. Electronically Signed   By: Rolm Baptise M.D.   On: 07/30/2018  20:52    Scheduled Meds: . insulin aspart  0-9 Units Subcutaneous Q4H    Continuous Infusions: . heparin 1,800 Units/hr (07/31/18 1531)  . lactated ringers with kcl Stopped (07/31/18 0858)  . piperacillin-tazobactam (ZOSYN)  IV 3.375 g (07/31/18 1530)     Time spent: 32mns I have personally reviewed and interpreted on  07/31/2018 daily labs, tele strips, imagings as discussed above under date review session and assessment and plans.  I reviewed all nursing notes, pharmacy notes, consultant notes,  vitals, pertinent old records  I have discussed plan of care as described above with RN , patient and family on 07/31/2018   FFlorencia ReasonsMD, PhD  [facility] Pager [phone] If 7PM-7AM, please contact night-coverage at [email or web address], password [email or web address]/18/2019, 6:25 PM  LOS: 1 day

## 2018-07-31 NOTE — Telephone Encounter (Signed)
Wife called. Patient in hospital for evaluation of small bowel blockage. Will not be here tomorrow for clinic appt. Wanted to know if [name] would be seeing him in hospital, if so, wants to discuss PET Scan results. Notified [name] verbally and by [facility] message,

## 2018-07-31 NOTE — Progress Notes (Signed)
[place] for IV heparin Indication: Hx VTE on Eliquis PTA  Allergies  Allergen Reactions  . Ciprofloxacin Other (See Comments)    Leg tingling    Patient Measurements: Height: 5' 9"  (175.3 cm) Weight: 219 lb 2.2 oz (99.4 kg) IBW/kg (Calculated) : 70.7 Heparin Dosing Weight: 91.7 kg  Vital Signs: Temp: 98.1 F (36.7 C) (11/18 0530) Temp Source: Oral (11/18 0530) BP: 132/87 (11/18 0530) Pulse Rate: 103 (11/18 0530)  Labs: Recent Labs    07/30/18 1531 07/30/18 2353 07/31/18 0434 07/31/18 0807  HGB 9.6*  --  8.5*  --   HCT 31.8*  --  28.3*  --   PLT 433*  --  396  --   APTT  --  28  --  36  LABPROT  --  15.1  --   --   INR  --  1.20  --   --   HEPARINUNFRC  --  1.44*  --   --   CREATININE 0.59*  --  0.52*  --     Estimated Creatinine Clearance: 111.3 mL/min (A) (by C-G formula based on SCr of 0.52 mg/dL (L)).  Medications:  Medications Prior to Admission  Medication Sig Dispense Refill Last Dose  . apixaban (ELIQUIS) 5 MG TABS tablet Take 1 tablet (5 mg total) by mouth 2 (two) times daily. 60 tablet 3 07/30/2018 at 0900  . blood glucose meter kit and supplies Dispense based on patient and insurance preference. Use daily as directed. (E11.9). 1 each 0 Taking  . Choline Fenofibrate 135 MG capsule TAKE 1 CAPSULE DAILY (Patient taking differently: Take 135 mg by mouth every evening. ) 90 capsule 1 Past Week at Unknown time  . furosemide (LASIX) 20 MG tablet TAKE 1 TABLET BY MOUTH DAILY HOLD AFTER 10-15 DAYS IF LEG SWELLING RESOLVED OR IF >10LBS WEIGHT LOSS (Patient taking differently: Take 20 mg by mouth daily as needed for fluid or edema. ) 30 tablet 0 Past Month at Unknown time  . glucose blood (FREESTYLE TEST STRIPS) test strip Use to check blood sugar daily 100 each 4 Taking  . HYDROcodone-acetaminophen (NORCO) 5-325 MG tablet Take 1-2 tablets by mouth every 4 (four) hours as needed for moderate pain. 30 tablet 0 Past Month at  Unknown time  . JANUMET 50-1000 MG tablet TAKE 1 TABLET TWICE DAILY  WITH MEALS (Patient taking differently: Take 1 tablet by mouth 2 (two) times daily with a meal. ) 180 tablet 3 Past Week at Unknown time  . lidocaine-prilocaine (EMLA) cream Apply 1 application topically as needed. (Patient taking differently: Apply 1 application topically daily as needed (access port). ) 30 g 2 Past Month at Unknown time  . Magnesium 500 MG CAPS Take 1 capsule (500 mg total) by mouth 2 (two) times daily. (Patient taking differently: Take 500 mg by mouth every evening. ) 60 capsule 0 Past Month at Unknown time  . senna-docusate (SENOKOT-S) 8.6-50 MG tablet Take 2 tablets by mouth at bedtime. (Patient taking differently: Take 2 tablets by mouth daily as needed for mild constipation. ) 60 tablet 1 07/29/2018 at Unknown time  . metroNIDAZOLE (FLAGYL) 500 MG tablet Take 1 tablet (500 mg total) by mouth 3 (three) times daily. (Patient not taking: Reported on 07/30/2018) 21 tablet 0 Not Taking at Unknown time  . ondansetron (ZOFRAN) 8 MG tablet Take 1 tablet (8 mg total) by mouth every 8 (eight) hours as needed for nausea or vomiting. (Patient not taking: Reported on  07/30/2018) 20 tablet 0 Not Taking at Unknown time   Scheduled:  . insulin aspart  0-9 Units Subcutaneous Q4H   PRN: acetaminophen **OR** acetaminophen, ondansetron **OR** ondansetron (ZOFRAN) IV, sodium chloride flush  Assessment: 88 yoM with PMH DVT on Eliquis, lymphoma on chemo, HTN, DM2 admitted for SBO. Conservative management at this time per Surgery. Pharmacy to dose IV heparin while Eliquis on hold.   Baseline INR, aPTT: WNL  Prior anticoagulation: Eliquis 5 mg bid, LD 11/17 0900 AM  Significant events:  Today, 07/31/2018:  CBC: Hgb down 1g since admission  Most recent aPTT level SUBtherapeutic on 1400 units/hr. Note heparin level, although expected to be elevated from Eliquis, has increased from previous high level since starting  heparin.  No bleeding or infusion issues per nursing  CrCl: > 90 ml/min  Goal of Therapy: Heparin level 0.3-0.7 units/ml Monitor platelets by anticoagulation protocol: Yes  Plan:  Increase in heparin level is unexpected, but until a trend becomes more clear, will continue to rely on aPTT for dosing; will recheck both levels in 6 hr  Increase heparin IV infusion to 1800 units/hr  Daily CBC and heparin level; aPTT as needed  Monitor for signs of bleeding or thrombosis   Reuel Boom, PharmD, BCPS [phone] 07/31/2018, 9:52 AM

## 2018-08-01 ENCOUNTER — Inpatient Hospital Stay (HOSPITAL_COMMUNITY): Payer: BLUE CROSS/BLUE SHIELD

## 2018-08-01 ENCOUNTER — Ambulatory Visit: Payer: BLUE CROSS/BLUE SHIELD | Admitting: Hematology

## 2018-08-01 ENCOUNTER — Inpatient Hospital Stay: Payer: BLUE CROSS/BLUE SHIELD

## 2018-08-01 ENCOUNTER — Other Ambulatory Visit: Payer: BLUE CROSS/BLUE SHIELD

## 2018-08-01 ENCOUNTER — Inpatient Hospital Stay: Payer: BLUE CROSS/BLUE SHIELD | Admitting: Hematology

## 2018-08-01 ENCOUNTER — Ambulatory Visit: Payer: BLUE CROSS/BLUE SHIELD

## 2018-08-01 DIAGNOSIS — C8510 Unspecified B-cell lymphoma, unspecified site: Secondary | ICD-10-CM

## 2018-08-01 DIAGNOSIS — K5732 Diverticulitis of large intestine without perforation or abscess without bleeding: Secondary | ICD-10-CM

## 2018-08-01 LAB — APTT
APTT: 65 s — AB (ref 24–36)
APTT: 66 s — AB (ref 24–36)
aPTT: 66 seconds — ABNORMAL HIGH (ref 24–36)

## 2018-08-01 LAB — LACTATE DEHYDROGENASE: LDH: 132 U/L (ref 98–192)

## 2018-08-01 LAB — BASIC METABOLIC PANEL
Anion gap: 9 (ref 5–15)
BUN: 14 mg/dL (ref 8–23)
CO2: 28 mmol/L (ref 22–32)
Calcium: 8.3 mg/dL — ABNORMAL LOW (ref 8.9–10.3)
Chloride: 105 mmol/L (ref 98–111)
Creatinine, Ser: 0.6 mg/dL — ABNORMAL LOW (ref 0.61–1.24)
GFR calc Af Amer: >60 mL/min (ref >60)
GLUCOSE: 113 mg/dL — AB (ref 70–99)
POTASSIUM: 3.1 mmol/L — AB (ref 3.5–5.1)
Sodium: 142 mmol/L (ref 135–145)

## 2018-08-01 LAB — CBC
HEMATOCRIT: 27.6 % — AB (ref 39.0–52.0)
Hemoglobin: 8.2 g/dL — ABNORMAL LOW (ref 13.0–17.0)
MCH: 31.1 pg (ref 26.0–34.0)
MCHC: 29.7 g/dL — AB (ref 30.0–36.0)
MCV: 104.5 fL — ABNORMAL HIGH (ref 80.0–100.0)
Platelets: 438 10*3/uL — ABNORMAL HIGH (ref 150–400)
RBC: 2.64 MIL/uL — ABNORMAL LOW (ref 4.22–5.81)
RDW: 18.7 % — ABNORMAL HIGH (ref 11.5–15.5)
WBC: 8.6 10*3/uL (ref 4.0–10.5)
nRBC: 0 % (ref 0.0–0.2)

## 2018-08-01 LAB — PREPARE RBC (CROSSMATCH)

## 2018-08-01 LAB — GLUCOSE, CAPILLARY
GLUCOSE-CAPILLARY: 102 mg/dL — AB (ref 70–99)
GLUCOSE-CAPILLARY: 79 mg/dL (ref 70–99)
GLUCOSE-CAPILLARY: 99 mg/dL (ref 70–99)
Glucose-Capillary: 96 mg/dL (ref 70–99)
Glucose-Capillary: 80 mg/dL (ref 70–99)
Glucose-Capillary: 69 mg/dL — ABNORMAL LOW (ref 70–99)
Glucose-Capillary: 105 mg/dL — ABNORMAL HIGH (ref 70–99)
Glucose-Capillary: 67 mg/dL — ABNORMAL LOW (ref 70–99)

## 2018-08-01 LAB — MAGNESIUM: MAGNESIUM: 1.9 mg/dL (ref 1.7–2.4)

## 2018-08-01 LAB — HEPARIN LEVEL (UNFRACTIONATED)
HEPARIN UNFRACTIONATED: 1.32 [IU]/mL — AB (ref 0.30–0.70)
HEPARIN UNFRACTIONATED: 1.06 [IU]/mL — AB (ref 0.30–0.70)

## 2018-08-01 LAB — VITAMIN B12: Vitamin B-12: 1282 pg/mL — ABNORMAL HIGH (ref 180–914)

## 2018-08-01 LAB — FOLATE: Folate: 14.2 ng/mL (ref >5.9)

## 2018-08-01 MED ORDER — HEPARIN (PORCINE) 25000 UT/250ML-% IV SOLN
2050.0000 [IU]/h | INTRAVENOUS | Status: DC
  Administered 2018-08-01 (×3): 2050 [IU]/h via INTRAVENOUS
  Filled 2018-08-01 (×2): qty 250

## 2018-08-01 MED ORDER — KCL IN DEXTROSE-NACL 20-5-0.45 MEQ/L-%-% IV SOLN
INTRAVENOUS | Status: DC
  Administered 2018-08-02 – 2018-08-03 (×3): via INTRAVENOUS
  Filled 2018-08-01 (×3): qty 1000

## 2018-08-01 MED ORDER — MAGNESIUM SULFATE IN D5W 1-5 GM/100ML-% IV SOLN
1.0000 g | Freq: Once | INTRAVENOUS | Status: AC
  Administered 2018-08-01: 1 g via INTRAVENOUS
  Filled 2018-08-01: qty 100

## 2018-08-01 MED ORDER — POTASSIUM CHLORIDE 10 MEQ/100ML IV SOLN
10.0000 meq | INTRAVENOUS | Status: AC
  Administered 2018-08-01 (×6): 10 meq via INTRAVENOUS
  Filled 2018-08-01 (×6): qty 100

## 2018-08-01 MED ORDER — SODIUM CHLORIDE 0.9% IV SOLUTION
Freq: Once | INTRAVENOUS | Status: AC
  Administered 2018-08-01: 21:00:00 via INTRAVENOUS

## 2018-08-01 MED ORDER — HEPARIN (PORCINE) 25000 UT/250ML-% IV SOLN
2100.0000 [IU]/h | INTRAVENOUS | Status: DC
  Administered 2018-08-02 – 2018-08-03 (×3): 2100 [IU]/h via INTRAVENOUS
  Filled 2018-08-01 (×3): qty 250

## 2018-08-01 MED ORDER — DEXTROSE 50 % IV SOLN
25.0000 mL | Freq: Once | INTRAVENOUS | Status: AC
  Administered 2018-08-01: 25 mL via INTRAVENOUS
  Filled 2018-08-01: qty 50

## 2018-08-01 MED ORDER — PROMETHAZINE HCL 25 MG/ML IJ SOLN
12.5000 mg | Freq: Once | INTRAMUSCULAR | Status: AC
  Administered 2018-08-01: 12.5 mg via INTRAVENOUS
  Filled 2018-08-01: qty 1

## 2018-08-01 NOTE — Progress Notes (Addendum)
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 -small bowel protocol with gastrografin per general surgery, remain on ng suction -will follow general surgery recommendation  Burkitt lymphoma  -status post cycle 6 of EPOCH-R dosed on 07/03/2018  -oncology Dr [name] consulted  Severe hypokalemia/hypomagnesemia: Replace k/mag Keep on tele until k/mag improves   Macrocytic anemia in the setting of malignancy: hgb above 9 per oncology Will transfuse prbc x1 on 11/19  noninsulin dependent dm2, controlled -home oral meds held -on ssi here  Patient started have hypoglycemia 11/19 pm, ivf changed to 1/2saline/d5/20k.  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    Code Status: full  Family Communication: patient and wife at bedside  Disposition Plan: not ready to discharge   Consultants:  General surgery  oncology  Procedures:  Ng placement  Antibiotics:  zosyn   Objective: BP 132/83 (BP Location: Left Arm)   Pulse 100   Temp 97.7 F (36.5 C) (Oral)   Resp 19   Ht 5' 9"  (1.753 m)   Wt 99.4 kg   SpO2 94%   BMI 32.36 kg/m   Intake/Output Summary (Last 24 hours) at  08/01/2018 1609 Last data filed at 08/01/2018 1536 Gross per 24 hour  Intake 1998.69 ml  Output 575 ml  Net 1423.69 ml   Filed Weights   07/30/18 2102  Weight: 99.4 kg    Exam: Patient is examined daily including today on 08/01/2018, exams remain the same as of yesterday except that has changed    General:  Pale, NAD  Cardiovascular: RRR  Respiratory: CTABL  Abdomen: Soft/ND/NT,  Decreased BS  Musculoskeletal: No Edema  Neuro: alert, oriented   Data Reviewed: Basic Metabolic Panel: Recent Labs  Lab 07/30/18 1531 07/30/18 1729 07/31/18 0434 08/01/18 0131  NA 140  --  142 142  K 2.6*  --  3.0* 3.1*  CL 102  --  102 105  CO2 27  --  30 28  GLUCOSE 146*  --  129* 113*  BUN 16  --  12 14  CREATININE 0.59*  --  0.52* 0.60*  CALCIUM 8.5*  --  8.4* 8.3*  MG  --  1.3* 1.7 1.9   Liver Function Tests: Recent Labs  Lab 07/30/18 1531 07/31/18 0434  AST 15 12*  ALT 12 11  ALKPHOS 55 48  BILITOT 0.5 0.6  PROT 6.1* 5.4*  ALBUMIN 3.2* 2.9*   Recent Labs  Lab 07/30/18 1531  LIPASE 18   No results for input(s): AMMONIA in the last 168 hours. CBC: Recent Labs  Lab 07/30/18 1531 07/31/18 0434 08/01/18 0131  WBC 9.3 9.3 8.6  NEUTROABS 7.3 7.3  --   HGB 9.6*  8.5* 8.2*  HCT 31.8* 28.3* 27.6*  MCV 101.3* 102.2* 104.5*  PLT 433* 396 438*   Cardiac Enzymes:   No results for input(s): CKTOTAL, CKMB, CKMBINDEX, TROPONINI in the last 168 hours. BNP (last 3 results) No results for input(s): BNP in the last 8760 hours.  ProBNP (last 3 results) No results for input(s): PROBNP in the last 8760 hours.  CBG: Recent Labs  Lab 07/31/18 2014 08/01/18 0016 08/01/18 0429 08/01/18 0755 08/01/18 1130  GLUCAP 96 102* 79 96 80    No results found for this or any previous visit (from the past 240 hour(s)).   Studies: Dg Abd 1 View  Result Date: 08/01/2018 CLINICAL DATA:  Small-bowel obstruction. EXAM: ABDOMEN - 1 VIEW COMPARISON:  07/31/2018.  CT 07/30/2018.  FINDINGS: Small amount of oral contrast noted in duodenum, proximal small bowel, this is progressed from the stomach from prior day's exam. This is progressed from prior exam of 07/31/2018. Air-fluid levels are noted throughout small and large bowel. Again these findings are consistent with possible small and large bowel obstruction as noted on recent CT. No free air is identified. No acute bony abnormality. IMPRESSION: 1. Progression of oral contrast into duodenum/proximal small bowel from prior day's exam. 2. Persistent dilated small and large bowel. Again these findings are consistent with possible small and large bowel obstruction is noted on recent CT. No free air. Electronically Signed   By: Marcello Moores  Register   On: 08/01/2018 13:01   Dg Abd Portable 1v-small Bowel Obstruction Protocol-initial, 8 Hr Delay  Result Date: 07/31/2018 CLINICAL DATA:  Small-bowel obstruction, 8 hour delayed image. EXAM: PORTABLE ABDOMEN - 1 VIEW COMPARISON:  07/30/2018 FINDINGS: Moderate contrast distention of the gastric fundus. There are dilated gas-filled small bowel loops scattered throughout the abdomen and pelvis consistent with small bowel obstruction. There are contrast distended small bowel loops in the left hemiabdomen with what appear to be some enteric contrast within large bowel loops in the right upper quadrant. Findings would suggest a high grade partial SBO accounting for this pattern. No free air is noted. No organomegaly. No acute osseous appearing. IMPRESSION: Contrast noted within the stomach, small bowel loops in the left hemiabdomen and admixed with stool within large bowel loops in the right upper quadrant. Findings would suggest a high-grade partial SBO. Electronically Signed   By: Ashley Royalty M.D.   On: 07/31/2018 22:02    Scheduled Meds: . sodium chloride   Intravenous Once  . insulin aspart  0-9 Units Subcutaneous Q4H    Continuous Infusions: . heparin 2,050 Units/hr (08/01/18 2426)  .  lactated ringers with kcl Stopped (08/01/18 0805)  . piperacillin-tazobactam (ZOSYN)  IV 3.375 g (08/01/18 1520)  . potassium chloride 10 mEq (08/01/18 1519)     Time spent: 40mns I have personally reviewed and interpreted on  08/01/2018 daily labs, tele strips, imagings as discussed above under date review session and assessment and plans.  I reviewed all nursing notes, pharmacy notes, consultant notes,  vitals, pertinent old records  I have discussed plan of care as described above with RN , patient and family on 08/01/2018   FFlorencia ReasonsMD, PhD  [facility] Pager [phone] If 7PM-7AM, please contact night-coverage at [email or web address], password [email or web address]11/19/2019, 4:09 PM  LOS: 2 days

## 2018-08-01 NOTE — Progress Notes (Signed)
Brief Pharmacy Note:  42 y/oM on IV heparin infusion for history of VTE while apixaban on hold. Patient admitted for SBO.    0129 am heparin level = 1.32, remains elevated as expected due to recent Apixaban use  0129 am aPTT = 65 seconds, almost within 66-102 sec goal after heparin rate increased to 1950 units/hr  No bleeding or infusion issues noted per nursing   Plan:  Continue to adjust heparin infusion using aPTT for now, until heparin level and aPTT correlate.   Increase heparin infusion to 2050 units/hr  aPTT and heparin level 6 hours after rate change  Daily CBC and heparin level  Monitor for s/sx of bleeding  Eudelia Bunch, Pharm.D [phone] 08/01/2018 4:35 AM

## 2018-08-01 NOTE — Plan of Care (Signed)
  Problem: Education: Goal: Knowledge of General Education information will improve Description Including pain rating scale, medication(s)/side effects and non-pharmacologic comfort measures Outcome: Progressing   Problem: Health Behavior/Discharge Planning: Goal: Ability to manage health-related needs will improve Outcome: Progressing   Problem: Elimination: Goal: Will not experience complications related to bowel motility Outcome: Progressing Goal: Will not experience complications related to urinary retention Outcome: Progressing   Problem: Pain Managment: Goal: General experience of comfort will improve Outcome: Progressing   Problem: Safety: Goal: Ability to remain free from injury will improve Outcome: Progressing   Problem: Skin Integrity: Goal: Risk for impaired skin integrity will decrease Outcome: Progressing

## 2018-08-01 NOTE — Progress Notes (Signed)
Hypoglycemic Event  CBG: 67  Treatment: D50 IV 25 mL  Symptoms: Pale and Shaky  Follow-up CBG: Time: 2119 CBG Result: 99  Possible Reasons for Event: Inadequate meal intake  Comments/MD notified:     Talbert Forest

## 2018-08-01 NOTE — Progress Notes (Signed)
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 The thickening is circumferential and obscures the lumen at this level. Differential include new lymphoma within the proximal sigmoid colon versus diverticulitis versus colitis related to immunotherapy. - CT scan showed dilated appendix with multiple appendicoliths. Mild periappendiceal soft tissue stranding. 7cm long segment of sigmoid thickening with mild periocolonic soft tissue stranding.  - etiology of diverticulitis vs appendicitis vs post treatment changes vs new malignancy - due to recent admission we will do the small bowel protocol with gastrografin even in the setting of pt's recent BM's.  - 8hr delay film showed high-grade partial SBO with small amt of contrast in the colon, will repeat film today at 1200  FEN: NPO, NGT VTE: SCD's, heparin ID: Zosyn 11/17>> Foley: none Follow up: TBD  DISPO: continue IV Zosyn, continue NGT, repeat xray at 1200 today.  I spoke to wife   LOS: 2 days    Subjective: CC: diarrhea overnight, intermittent RLQ abdominal pain  Pt up all night with loose stools. He is feeling tired today. No other overnight issues  Objective: Vital signs in last 24 hours: Temp:  [97.7 F (36.5 C)-98.1 F (36.7 C)] 98.1 F (36.7 C) (11/19 0426) Pulse Rate:  [104-106] 104 (11/19 0426) Resp:  [20] 20 (11/19 0426) BP: (132-140)/(89-90) 137/89 (11/19 0426) SpO2:  [93 %-97 %] 94 % (11/19 0426) Last BM Date: 08/01/18  Intake/Output from previous day: 11/18 0701 - 11/19 0700 In: 2074.8 [I.V.:1371.3; IV Piggyback:703.5] Out: 325 [Urine:200;  Emesis/NG output:125] Intake/Output this shift: No intake/output data recorded.  PE: Gen:  Alert, NAD, pleasant, cooperative Pulm:  Rate and effort normal Abd: Soft, obese, mild distention, NT, +BS Skin: no rashes noted, warm and dry   Anti-infectives: Anti-infectives (From admission, onward)   Start     Dose/Rate Route Frequency Ordered Stop   07/31/18 0800  piperacillin-tazobactam (ZOSYN) IVPB 3.375 g     3.375 g 12.5 mL/hr over 240 Minutes Intravenous Every 8 hours 07/31/18 0231     07/30/18 2200  piperacillin-tazobactam (ZOSYN) IVPB 3.375 g     3.375 g 100 mL/hr over 30 Minutes Intravenous  Once 07/30/18 2149 07/31/18 0200      Lab Results:  Recent Labs    07/31/18 0434 08/01/18 0131  WBC 9.3 8.6  HGB 8.5* 8.2*  HCT 28.3* 27.6*  PLT 396 438*   BMET Recent Labs    07/31/18 0434 08/01/18 0131  NA 142 142  K 3.0* 3.1*  CL 102 105  CO2 30 28  GLUCOSE 129* 113*  BUN 12 14  CREATININE 0.52* 0.60*  CALCIUM 8.4* 8.3*   PT/INR Recent Labs    07/30/18 2353  LABPROT 15.1  INR 1.20   CMP     Component Value Date/Time   NA 142 08/01/2018 0131   K 3.1 (L) 08/01/2018 0131   CL 105 08/01/2018 0131   CO2 28 08/01/2018 0131   GLUCOSE 113 (H) 08/01/2018 0131   BUN 14 08/01/2018 0131   CREATININE 0.60 (L) 08/01/2018 0131   CREATININE 0.89 07/17/2018 1150   CREATININE 1.26 12/01/2012 1700   CALCIUM 8.3 (L) 08/01/2018 0131  PROT 5.4 (L) 07/31/2018 0434   ALBUMIN 2.9 (L) 07/31/2018 0434   AST 12 (L) 07/31/2018 0434   AST 13 (L) 07/17/2018 1150   ALT 11 07/31/2018 0434   ALT 15 07/17/2018 1150   ALKPHOS 48 07/31/2018 0434   BILITOT 0.6 07/31/2018 0434   BILITOT 0.3 07/17/2018 1150   GFRNONAA >60 08/01/2018 0131   GFRNONAA >60 07/17/2018 1150   GFRAA >60 08/01/2018 0131   GFRAA >60 07/17/2018 1150   Lipase     Component Value Date/Time   LIPASE 18 07/30/2018 1531    Studies/Results: Dg Abdomen 1 View  Result Date: 07/30/2018 CLINICAL DATA:  NG  tube placement EXAM: ABDOMEN - 1 VIEW COMPARISON:  CT 07/30/2018 FINDINGS: The chest was included due to the NG tube not being present in the abdomen. The NG tube coils in the upper chest near the thoracic inlet. Right Port-A-Cath in place with the tip at the cavoatrial junction. Diffuse gaseous distention of bowel. IMPRESSION: NG tube coils in the upper chest near the thoracic inlet. The tip is not visualized. Electronically Signed   By: Rolm Baptise M.D.   On: 07/30/2018 20:52   Ct Abdomen Pelvis W Contrast  Result Date: 07/30/2018 CLINICAL DATA:  Abdominal distention and vomiting. Finished chemotherapy for Burkitt's lymphoma. Recent small bowel obstruction. EXAM: CT ABDOMEN AND PELVIS WITH CONTRAST TECHNIQUE: Multidetector CT imaging of the abdomen and pelvis was performed using the standard protocol following bolus administration of intravenous contrast. CONTRAST:  164m ISOVUE-300 IOPAMIDOL (ISOVUE-300) INJECTION 61% COMPARISON:  PET-CT dated 07/28/2018. Pelvis CT dated 07/21/2018. Abdomen CT dated 07/21/2018. FINDINGS: Lower chest: Bilateral lower lobe atelectasis. Hepatobiliary: Small cyst in the dome of the liver on the right. Additional tiny right lobe liver cyst more inferiorly. Small gallbladder Phrygian cap. Pancreas: Unremarkable. No pancreatic ductal dilatation or surrounding inflammatory changes. Spleen: Normal in size without focal abnormality. Adrenals/Urinary Tract: Normal appearing adrenal glands. Small bilateral renal cysts. 7 mm lower pole right renal calculus. No bladder or ureteral calculi and no hydronephrosis. Stomach/Bowel: Multiple significantly dilated, fluid and gas-filled loops of small bowel to the level of the terminal ileum with mildly dilated colon filled with fluid and gas, to the level of the previously demonstrated 7 cm segment of sigmoid colon with diffuse wall thickening and mild pericolonic soft tissue stranding. A diverticulum is demonstrated at the distal aspect of this  abnormality. Also demonstrated is a dilated appendix containing multiple appendicoliths measuring up to 8 mm in maximum diameter each. The appendix measures 14 mm in maximum diameter. There is periappendiceal and pericolonic soft tissue stranding in that region. Vascular/Lymphatic: No significant vascular findings are present. No enlarged abdominal or pelvic lymph nodes. Reproductive: Minimally enlarged prostate gland containing coarse calcifications. Other: Small bilateral inguinal hernias containing fat. Musculoskeletal: Lumbar and lower thoracic spine degenerative changes. IMPRESSION: 1. Recurrent or persistent distal small bowel obstruction to the level of soft tissue stranding in the central pelvis in an area where the appendix is dilated, containing multiple appendicoliths and where there is a 7 cm long segment of previously demonstrated sigmoid colon thickening and mild pericolonic soft tissue stranding. There is mild colonic dilatation compatible with partial obstruction to the level of the thickened sigmoid colon. There is also mild periappendiceal soft tissue stranding. This could all be due to post treatment changes, appendicitis, sigmoid diverticulitis or a combination of those causes. As previously mentioned, the sigmoid colon wall thickening could also be due to malignancy. 2. 7 mm nonobstructing lower pole right  renal calculus. Electronically Signed   By: Claudie Revering M.D.   On: 07/30/2018 18:47   Dg Abd Portable 1v-small Bowel Obstruction Protocol-initial, 8 Hr Delay  Result Date: 07/31/2018 CLINICAL DATA:  Small-bowel obstruction, 8 hour delayed image. EXAM: PORTABLE ABDOMEN - 1 VIEW COMPARISON:  07/30/2018 FINDINGS: Moderate contrast distention of the gastric fundus. There are dilated gas-filled small bowel loops scattered throughout the abdomen and pelvis consistent with small bowel obstruction. There are contrast distended small bowel loops in the left hemiabdomen with what appear to be some  enteric contrast within large bowel loops in the right upper quadrant. Findings would suggest a high grade partial SBO accounting for this pattern. No free air is noted. No organomegaly. No acute osseous appearing. IMPRESSION: Contrast noted within the stomach, small bowel loops in the left hemiabdomen and admixed with stool within large bowel loops in the right upper quadrant. Findings would suggest a high-grade partial SBO. Electronically Signed   By: Ashley Royalty M.D.   On: 07/31/2018 22:02      Kalman Drape , [facility] 08/01/2018, 9:57 AM  Pager: [phone] Mon-Wed, Friday 7:00am-4:30pm Thurs 7am-11:30am  Consults: [phone]

## 2018-08-01 NOTE — Progress Notes (Signed)
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 Ciprofloxacin Other (See Comments)    Leg tingling    Patient Measurements: Height: 5' 9"  (175.3 cm) Weight: 219 lb 2.2 oz (99.4 kg) IBW/kg (Calculated) : 70.7 Heparin Dosing Weight: 91.7 kg  Vital Signs: Temp: 98.1 F (36.7 C) (11/19 0426) Temp Source: Oral (11/19 0426) BP: 137/89 (11/19 0426) Pulse Rate: 104 (11/19 0426)  Labs: Recent Labs    07/30/18 1531 07/30/18 2353 07/31/18 0434  07/31/18 1652 08/01/18 0129 08/01/18 0131 08/01/18 1019  HGB 9.6*  --  8.5*  --   --   --  8.2*  --   HCT 31.8*  --  28.3*  --   --   --  27.6*  --   PLT 433*  --  396  --   --   --  438*  --   APTT  --  28  --    < > 58* 65*  --  66*  LABPROT  --  15.1  --   --   --   --   --   --   INR  --  1.20  --   --   --   --   --   --   HEPARINUNFRC  --  1.44*  --    < > 1.86* 1.32*  --  1.06*  CREATININE 0.59*  --  0.52*  --   --   --  0.60*  --    < > = values in this interval not displayed.    Estimated Creatinine Clearance: 111.3 mL/min (A) (by C-G formula based on SCr of 0.6 mg/dL (L)).  Medications:  Medications Prior to Admission  Medication Sig Dispense Refill Last Dose  . apixaban (ELIQUIS) 5 MG TABS tablet Take 1 tablet (5 mg total) by mouth 2 (two) times daily. 60 tablet 3 07/30/2018 at 0900  . blood glucose meter kit and supplies Dispense based on patient and insurance preference. Use daily as directed. (E11.9). 1 each 0 Taking  . Choline Fenofibrate 135 MG capsule TAKE 1 CAPSULE DAILY (Patient taking differently: Take 135 mg by mouth every evening. ) 90 capsule 1 Past Week at Unknown time  . furosemide (LASIX) 20 MG tablet TAKE 1 TABLET BY MOUTH DAILY HOLD AFTER 10-15 DAYS IF LEG SWELLING RESOLVED OR IF >10LBS WEIGHT LOSS (Patient taking differently: Take 20 mg by mouth daily as needed for fluid or edema. ) 30 tablet 0 Past Month at Unknown time  .  glucose blood (FREESTYLE TEST STRIPS) test strip Use to check blood sugar daily 100 each 4 Taking  . HYDROcodone-acetaminophen (NORCO) 5-325 MG tablet Take 1-2 tablets by mouth every 4 (four) hours as needed for moderate pain. 30 tablet 0 Past Month at Unknown time  . JANUMET 50-1000 MG tablet TAKE 1 TABLET TWICE DAILY  WITH MEALS (Patient taking differently: Take 1 tablet by mouth 2 (two) times daily with a meal. ) 180 tablet 3 Past Week at Unknown time  . lidocaine-prilocaine (EMLA) cream Apply 1 application topically as needed. (Patient taking differently: Apply 1 application topically daily as needed (access port). ) 30 g 2 Past Month at Unknown time  . Magnesium 500 MG CAPS Take 1 capsule (500 mg total) by mouth 2 (two) times daily. (Patient taking differently: Take 500 mg by mouth every evening. ) 60 capsule 0 Past Month at Unknown time  .  senna-docusate (SENOKOT-S) 8.6-50 MG tablet Take 2 tablets by mouth at bedtime. (Patient taking differently: Take 2 tablets by mouth daily as needed for mild constipation. ) 60 tablet 1 07/29/2018 at Unknown time  . metroNIDAZOLE (FLAGYL) 500 MG tablet Take 1 tablet (500 mg total) by mouth 3 (three) times daily. (Patient not taking: Reported on 07/30/2018) 21 tablet 0 Not Taking at Unknown time  . ondansetron (ZOFRAN) 8 MG tablet Take 1 tablet (8 mg total) by mouth every 8 (eight) hours as needed for nausea or vomiting. (Patient not taking: Reported on 07/30/2018) 20 tablet 0 Not Taking at Unknown time   Scheduled:  . insulin aspart  0-9 Units Subcutaneous Q4H   PRN: acetaminophen **OR** acetaminophen, ondansetron **OR** ondansetron (ZOFRAN) IV, sodium chloride flush  Assessment: 58 yoM with PMH DVT on Eliquis, lymphoma on chemo, HTN, DM2 admitted for SBO. Conservative management at this time per Surgery. Pharmacy to dose IV heparin while Eliquis on hold.   Baseline INR, aPTT: WNL  Prior anticoagulation: Eliquis 5 mg bid, LD 11/17 0900 AM  Significant  events:  Today, 08/01/2018:  CBC: Hgb trending down, transfusing  aPTT level therapeutic on 2050 units/hr  Heparin level 1.06- expected to be elevated from Eliquis, has increased from previous high level since starting heparin.  No bleeding or infusion issues per nursing  CrCl: > 90 ml/min  Goal of Therapy: aPTT goal 66-102 sec Heparin level 0.3-0.7 units/ml Monitor platelets by anticoagulation protocol: Yes  Plan:  Continue heparin IV infusion at 2050 units/hr  Recheck 6h aPTT to confirm therapeutic range   Daily CBC and heparin level/aPTT  Monitor for signs of bleeding or thrombosis  Netta Cedars, PharmD, BCPS Pager: [phone] 08/01/2018, 11:19 AM

## 2018-08-01 NOTE — Progress Notes (Signed)
HEMATOLOGY/ONCOLOGY INPATIENT PROGRESS NOTE  Date of Service: 08/01/2018  Inpatient Attending: .Florencia Reasons, MD   SUBJECTIVE:   Mark Frey is accompanied today by his wife at bedside.   After completing his sixth planned cycle of EPOCH-R treatment on 07/07/18, the pt presented to our symptom management clinic on 07/20/18 for left lower quadrant abdominal pain. After the 07/21/18 CT scan observed a high grade small bowel obstruction, the pt was admitted between 07/21/18 and 07/24/18. He then returned on 07/30/18 with continued nausea, vomiting, abdominal pain and distension.   The pt reports that he has been passing gas and has had lots of diarrhea. He is having diarrhea about every 40 minutes, and denies mucous or blood in the stools. The pt notes that his nausea has been marginally improved.  Not much output from NGT.  Lab results today (08/01/18) of CBC and BMP is as follows: all values are WNL except for RBC at 2.64, HGB at 8.2, HCT at 27.6, MCV at 104.5, MCHC at 29.7, RDW at 18.7, PLT at 438k, Potassium at 3.1, Glucose at 113, Creatinine at 0.60, Calcium at 8.3. LDH ordered and was done and noted to be WNL at 132 which is a little reassuring from lymphoma standpoint.  On review of systems, pt reports frequent diarrhea, slightly improved nausea, and denies abdominal pain with palpation, difficulty urinating, leg swelling, and any other symptoms.   OBJECTIVE:  Mild distress due to diarrhea and NGT  PHYSICAL EXAMINATION: . Vitals:   07/31/18 1406 07/31/18 2019 08/01/18 0426 08/01/18 1347  BP: 140/90 132/89 137/89 132/83  Pulse: (!) 106 (!) 106 (!) 104 100  Resp: 20 20 20 19   Temp: 97.7 F (36.5 C) 97.8 F (36.6 C) 98.1 F (36.7 C) 97.7 F (36.5 C)  TempSrc: Oral Oral Oral Oral  SpO2: 97% 93% 94% 94%  Weight:      Height:       Filed Weights   07/30/18 2102  Weight: 219 lb 2.2 oz (99.4 kg)   .Body mass index is 32.36 kg/m.  GENERAL:alert, in no acute distress and  comfortable SKIN: skin color, texture, turgor are normal, no rashes or significant lesions EYES: normal, conjunctiva are pink and non-injected, sclera clear OROPHARYNX:no exudate, no erythema and lips, buccal mucosa, and tongue normal  NECK: supple, no JVD, thyroid normal size, non-tender, without nodularity LYMPH:  no palpable lymphadenopathy in the cervical, axillary or inguinal LUNGS: clear to auscultation with normal respiratory effort HEART: regular rate & rhythm,  no murmurs and no lower extremity edema ABDOMEN: abdomen soft, non-tender, hyperactive bowel sounds  Musculoskeletal: no cyanosis of digits and no clubbing  PSYCH: alert & oriented x 3 with fluent speech NEURO: no focal motor/sensory deficits  MEDICAL HISTORY:  Past Medical History:  Diagnosis Date  . ALLERGIC RHINITIS   . Cancer ([place])    Lymphoma   . Diabetes mellitus   . Hyperlipidemia     SURGICAL HISTORY: Past Surgical History:  Procedure Laterality Date  . BIOPSY  03/15/2018   Procedure: BIOPSY;  Surgeon: Milus Banister, MD;  Location: [facility];  Service: Endoscopy;;  . CLEFT PALATE REPAIR    . COLONOSCOPY N/A 03/15/2018   Procedure: COLONOSCOPY;  Surgeon: Milus Banister, MD;  Location: [facility];  Service: Endoscopy;  Laterality: N/A;  . deviated septum repair     slight improvement  . ESOPHAGOGASTRODUODENOSCOPY N/A 03/15/2018   Procedure: ESOPHAGOGASTRODUODENOSCOPY (EGD);  Surgeon: Milus Banister, MD;  Location: [name];  Service:  Endoscopy;  Laterality: N/A;  . IR IMAGING GUIDED PORT INSERTION  03/17/2018  . TONSILLECTOMY      SOCIAL HISTORY: Social History   Socioeconomic History  . Marital status: Married    Spouse name: lisa  . Number of children: 0  . Years of education: Not on file  . Highest education level: Not on file  Occupational History  . Not on file  Social Needs  . Financial resource strain: Not hard at all  . Food insecurity:    Worry: Never true    Inability:  Never true  . Transportation needs:    Medical: No    Non-medical: No  Tobacco Use  . Smoking status: Never Smoker  . Smokeless tobacco: Never Used  Substance and Sexual Activity  . Alcohol use: Yes    Comment: occasional  . Drug use: No  . Sexual activity: Yes  Lifestyle  . Physical activity:    Days per week: 0 days    Minutes per session: 0 min  . Stress: Not at all  Relationships  . Social connections:    Talks on phone: More than three times a week    Gets together: More than three times a week    Attends religious service: 1 to 4 times per year    Active member of club or organization: No    Attends meetings of clubs or organizations: Never    Relationship status: Married  . Intimate partner violence:    Fear of current or ex partner: No    Emotionally abused: No    Physically abused: No    Forced sexual activity: No  Other Topics Concern  . Not on file  Social History Narrative   Married 1985. No kids. 4 small dogs.       Works in Financial trader, residential      Hobbies: work on cars, Haematologist, exercise as able    FAMILY HISTORY: Family History  Problem Relation Age of Onset  . Lung cancer Mother        smoker  . Brain cancer Mother        metastasis  . AAA (abdominal aortic aneurysm) Father        smoker    ALLERGIES:  is allergic to ciprofloxacin.  MEDICATIONS:  Scheduled Meds: . sodium chloride   Intravenous Once  . dextrose  25 mL Intravenous Once  . insulin aspart  0-9 Units Subcutaneous Q4H   Continuous Infusions: . heparin 2,050 Units/hr (08/01/18 5638)  . lactated ringers with kcl Stopped (08/01/18 0805)  . piperacillin-tazobactam (ZOSYN)  IV 3.375 g (08/01/18 1520)   PRN Meds:.acetaminophen **OR** acetaminophen, ondansetron **OR** ondansetron (ZOFRAN) IV, sodium chloride flush  REVIEW OF SYSTEMS:    10 Point review of Systems was done is negative except as noted above.   LABORATORY DATA:  I have reviewed the data as  listed  . CBC Latest Ref Rng & Units 08/01/2018 07/31/2018 07/30/2018  WBC 4.0 - 10.5 K/uL 8.6 9.3 9.3  Hemoglobin 13.0 - 17.0 g/dL 8.2(L) 8.5(L) 9.6(L)  Hematocrit 39.0 - 52.0 % 27.6(L) 28.3(L) 31.8(L)  Platelets 150 - 400 K/uL 438(H) 396 433(H)    . CMP Latest Ref Rng & Units 08/01/2018 07/31/2018 07/30/2018  Glucose 70 - 99 mg/dL 113(H) 129(H) 146(H)  BUN 8 - 23 mg/dL 14 12 16   Creatinine 0.61 - 1.24 mg/dL 0.60(L) 0.52(L) 0.59(L)  Sodium 135 - 145 mmol/L 142 142 140  Potassium 3.5 - 5.1 mmol/L 3.1(L) 3.0(L)  2.6(LL)  Chloride 98 - 111 mmol/L 105 102 102  CO2 22 - 32 mmol/L 28 30 27   Calcium 8.9 - 10.3 mg/dL 8.3(L) 8.4(L) 8.5(L)  Total Protein 6.5 - 8.1 g/dL - 5.4(L) 6.1(L)  Total Bilirubin 0.3 - 1.2 mg/dL - 0.6 0.5  Alkaline Phos 38 - 126 U/L - 48 55  AST 15 - 41 U/L - 12(L) 15  ALT 0 - 44 U/L - 11 12     RADIOGRAPHIC STUDIES: I have personally reviewed the radiological images as listed and agreed with the findings in the report. Dg Abd 1 View  Result Date: 08/01/2018 CLINICAL DATA:  Small-bowel obstruction. EXAM: ABDOMEN - 1 VIEW COMPARISON:  07/31/2018.  CT 07/30/2018. FINDINGS: Small amount of oral contrast noted in duodenum, proximal small bowel, this is progressed from the stomach from prior day's exam. This is progressed from prior exam of 07/31/2018. Air-fluid levels are noted throughout small and large bowel. Again these findings are consistent with possible small and large bowel obstruction as noted on recent CT. No free air is identified. No acute bony abnormality. IMPRESSION: 1. Progression of oral contrast into duodenum/proximal small bowel from prior day's exam. 2. Persistent dilated small and large bowel. Again these findings are consistent with possible small and large bowel obstruction is noted on recent CT. No free air. Electronically Signed   By: Marcello Moores  Register   On: 08/01/2018 13:01   Dg Abdomen 1 View  Result Date: 07/30/2018 CLINICAL DATA:  NG tube  placement EXAM: ABDOMEN - 1 VIEW COMPARISON:  CT 07/30/2018 FINDINGS: The chest was included due to the NG tube not being present in the abdomen. The NG tube coils in the upper chest near the thoracic inlet. Right Port-A-Cath in place with the tip at the cavoatrial junction. Diffuse gaseous distention of bowel. IMPRESSION: NG tube coils in the upper chest near the thoracic inlet. The tip is not visualized. Electronically Signed   By: Rolm Baptise M.D.   On: 07/30/2018 20:52   Dg Abd 1 View  Result Date: 07/23/2018 CLINICAL DATA:  Follow-up small bowel obstruction. EXAM: ABDOMEN - 1 VIEW COMPARISON:  07/21/2018, radiographs and CT. FINDINGS: There is persistent dilation of the small bowel with minimal colonic air, findings similar to the prior exam, consistent with a high-grade partial small bowel obstruction. IMPRESSION: 1. No significant change. Persistent high-grade small bowel obstruction. Electronically Signed   By: Lajean Manes M.D.   On: 07/23/2018 08:51   Dg Abdomen 1 View  Result Date: 07/21/2018 CLINICAL DATA:  Bedside nasogastric tube placement. Small bowel obstruction. EXAM: ABDOMEN - 1 VIEW COMPARISON:  CT abdomen and pelvis earlier same day. Abdominal x-rays 07/20/2018, 07/17/2018. FINDINGS: Persistent markedly distended loops of small bowel diffusely throughout the abdomen. The CT pelvis earlier today showed that the transition point is in the pelvis. Gas is present within normal caliber colon, indicating incomplete obstruction. Nasogastric tube tip fundus of the stomach. Contrast material in the urinary bladder related to the IV contrast administration earlier today. IMPRESSION: 1. Nasogastric tube tip in the fundus the stomach. 2. High-grade partial small bowel obstruction as demonstrated on the CT earlier today, worse than on yesterday's KUB. Electronically Signed   By: Evangeline Dakin M.D.   On: 07/21/2018 17:01   Dg Abd 1 View  Result Date: 07/20/2018 CLINICAL DATA:  Abdominal  distention and excess gas. EXAM: ABDOMEN - 1 VIEW COMPARISON:  07/17/2018. FINDINGS: Interval dilated small bowel loops. There is also an interval nodular mucosal pattern  involving the filled components of the transverse, distal descending and proximal sigmoid colon. No gross free peritoneal air. Lower thoracic and mild lumbar spine degenerative changes. IMPRESSION: 1. Interval partial small bowel obstruction. 2. Interval nodular mucosal pattern involving the transverse, distal descending and proximal sigmoid colon. This could be due to infectious or inflammatory colitis. Electronically Signed   By: Claudie Revering M.D.   On: 07/20/2018 12:06   Dg Abd 1 View  Result Date: 07/17/2018 CLINICAL DATA:  Left lower quadrant abdominal pain. EXAM: ABDOMEN - 1 VIEW COMPARISON:  06/09/2018 FINDINGS: Moderate amount of intestinal gas which could be due to ileus or partial obstruction. Right renal stone again seen. No acute or significant bone finding. IMPRESSION: Moderate amount of intestinal gas within small and large bowel that could be due to ileus or partial obstruction. Relative paucity of gas projects over the pelvis. Right renal stone, chronic. Electronically Signed   By: Nelson Chimes M.D.   On: 07/17/2018 13:26   Ct Pelvis W Contrast  Result Date: 07/21/2018 CLINICAL DATA:  62 year old with current history of B-cell lymphoma, with evidence of partial small bowel obstruction on abdominal x-rays yesterday. CT abdomen was performed earlier today demonstrating a high-grade small-bowel obstruction, though the transition point was not visible on that examination. EXAM: CT PELVIS WITH CONTRAST (10:40 a.m.: TECHNIQUE: Multidetector CT imaging of the pelvis was performed using the standard protocol following the bolus administration of intravenous contrast. CONTRAST:  One hundred ISOVUE-300 IOPAMIDOL INJECTION 61% IV and oral contrast which were administered at the time of the CT abdomen performed earlier same day at 8:47  a.m. COMPARISON:  CT abdomen and pelvis performed earlier same day. (These examinations will be combined into a single CT abdomen and pelvis order). PET-CT 06/09/2018, 04/18/2018. FINDINGS: Urinary Tract: See report of CT abdomen performed earlier same day as the kidneys are not included on the current examination. Urinary bladder decompressed and normal in appearance, containing contrast. Contrast within normal caliber distal LEFT ureter. Bowel: Dilated loops of small bowel throughout the pelvis with a transition to normal caliber distal and terminal ileum approximately 6 cm proximal to the ileocecal valve. A small amount of the previously administered oral contrast material is present in the normal caliber distal ileum and in the cecum and ascending colon, indicating incomplete obstruction. The transition point is at the site of previously treated lymphoma where there is scar and post treatment change. Scattered sigmoid colon diverticula are present. Liquid stool is present within normal caliber visualized colon. Vascular/Lymphatic: Scar/post treatment change in the pelvis at the site of the previously treated lymphoma in the pelvis. No residual mass in the ANTERIOR pelvis at the site of the FDG avid lymphoma on the 06/09/2018 PET/CT. No pathologic lymphadenopathy in the pelvis currently. No visible aorto-iliofemoral atherosclerosis. Reproductive: Calcifications within normal sized prostate gland. Normal appearing seminal vesicles. Other:  None. Musculoskeletal: Degenerative changes involving the sacroiliac joints with partial ankylosis of the RIGHT SI joint. 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 Facet degenerative changes at L5-S1. No acute findings. IMPRESSION: 1. The transition point for the small-bowel obstruction is in the pelvis and appears to be approximately 6 cm proximal to the ileocecal valve. The obstruction is felt to be secondary to  scar/post treatment change in the pelvis at the site of the previous lymphoma. 2. The FDG avid mass in the ANTERIOR pelvis on the recent PET-CT 06/09/2018 has resolved. No pathologic lymphadenopathy in the pelvis currently.  Electronically Signed   By: Evangeline Dakin M.D.   On: 07/21/2018 11:28   Ct Abdomen W Contrast  Result Date: 07/21/2018 CLINICAL DATA:  Left-sided abdominal pain and bloating. Nausea vomiting diarrhea. History of lymphoma. EXAM: CT ABDOMEN WITH CONTRAST TECHNIQUE: Multidetector CT imaging of the abdomen was performed using the standard protocol following bolus administration of intravenous contrast. CONTRAST:  150m OMNIPAQUE IOHEXOL 300 MG/ML  SOLN COMPARISON:  PET-CT from 06/09/2018 FINDINGS: Lower chest: Subsegmental atelectasis noted within the lung bases no pleural effusion or airspace densities. Hepatobiliary: Tiny low-density structure within the dome of liver measures 4 mm and is too small to characterize. Unchanged. There is relative hypertrophy of the lateral segment of left lobe of liver. No contour abnormality identified. Gallbladder appears normal. No biliary dilatation. Pancreas: Pancreas appears normal. Spleen: Spleen is unremarkable. Adrenals/Urinary Tract: Normal adrenal glands. Small bilateral low-density kidney lesions are noted, these are less than 1 cm and too small to reliably characterize. Right renal calculus measures 6 mm. No hydronephrosis noted bilaterally. Stomach/Bowel: Stomach appears normal. There is abnormal increase caliber of the visualized small bowel loops which measure up to 4.4 cm. There is also diffuse distension of the colon. Small and large bowel air-fluid levels are identified. The pelvic bowel loops are not included on this exam. Vascular/Lymphatic: Mild aortic atherosclerosis. No aneurysm. No abdominopelvic adenopathy identified. Other: There is trace fluid identified along the pericolic gutters. No pneumoperitoneum. No focal fluid collections  identified. Musculoskeletal: Mild spondylosis within the thoracic and lumbar spine. IMPRESSION: 1. Imaging findings concerning for high-grade bowel obstruction. As this exam is of the abdomen only the bowel loops are incompletely visualized, and therefore the transition point (etiology of the obstruction)is indeterminate. Findings may reflect a high-grade partial small bowel obstruction or distal large bowel obstruction. Metabolically active tumor within the pelvis was identified on PET scan from 06/09/2018 and could potentially reflect a site of obstruction. Consider further evaluation with complete CT of the abdomen pelvis with contrast material for more thorough assessment of the bowel loops. 2. Nonobstructing right renal calculus. Electronically Signed   By: TKerby MoorsM.D.   On: 07/21/2018 09:30   Ct Abdomen Pelvis W Contrast  Result Date: 07/30/2018 CLINICAL DATA:  Abdominal distention and vomiting. Finished chemotherapy for Burkitt's lymphoma. Recent small bowel obstruction. EXAM: CT ABDOMEN AND PELVIS WITH CONTRAST TECHNIQUE: Multidetector CT imaging of the abdomen and pelvis was performed using the standard protocol following bolus administration of intravenous contrast. CONTRAST:  1059mISOVUE-300 IOPAMIDOL (ISOVUE-300) INJECTION 61% COMPARISON:  PET-CT dated 07/28/2018. Pelvis CT dated 07/21/2018. Abdomen CT dated 07/21/2018. FINDINGS: Lower chest: Bilateral lower lobe atelectasis. Hepatobiliary: Small cyst in the dome of the liver on the right. Additional tiny right lobe liver cyst more inferiorly. Small gallbladder Phrygian cap. Pancreas: Unremarkable. No pancreatic ductal dilatation or surrounding inflammatory changes. Spleen: Normal in size without focal abnormality. Adrenals/Urinary Tract: Normal appearing adrenal glands. Small bilateral renal cysts. 7 mm lower pole right renal calculus. No bladder or ureteral calculi and no hydronephrosis. Stomach/Bowel: Multiple significantly dilated,  fluid and gas-filled loops of small bowel to the level of the terminal ileum with mildly dilated colon filled with fluid and gas, to the level of the previously demonstrated 7 cm segment of sigmoid colon with diffuse wall thickening and mild pericolonic soft tissue stranding. A diverticulum is demonstrated at the distal aspect of this abnormality. Also demonstrated is a dilated appendix containing multiple appendicoliths measuring up to 8 mm in maximum diameter each. The appendix measures  14 mm in maximum diameter. There is periappendiceal and pericolonic soft tissue stranding in that region. Vascular/Lymphatic: No significant vascular findings are present. No enlarged abdominal or pelvic lymph nodes. Reproductive: Minimally enlarged prostate gland containing coarse calcifications. Other: Small bilateral inguinal hernias containing fat. Musculoskeletal: Lumbar and lower thoracic spine degenerative changes. IMPRESSION: 1. Recurrent or persistent distal small bowel obstruction to the level of soft tissue stranding in the central pelvis in an area where the appendix is dilated, containing multiple appendicoliths and where there is a 7 cm long segment of previously demonstrated sigmoid colon thickening and mild pericolonic soft tissue stranding. There is mild colonic dilatation compatible with partial obstruction to the level of the thickened sigmoid colon. There is also mild periappendiceal soft tissue stranding. This could all be due to post treatment changes, appendicitis, sigmoid diverticulitis or a combination of those causes. As previously mentioned, the sigmoid colon wall thickening could also be due to malignancy. 2. 7 mm nonobstructing lower pole right renal calculus. Electronically Signed   By: Claudie Revering M.D.   On: 07/30/2018 18:47   Nm Pet Image Restag (ps) Skull Base To Thigh  Addendum Date: 07/28/2018   ADDENDUM REPORT: 07/28/2018 14:11 ADDENDUM: Findings and recommendations conveyed Wilhelmina Mcardle'  s nurse [facility] 07/28/2018 at14:11. Electronically Signed   By: Suzy Bouchard M.D.   On: 07/28/2018 14:11   Result Date: 07/28/2018 CLINICAL DATA:  Subsequent treatment strategy for Burkitt's lymphoma. Post induction chemotherapy. Chemo immunotherapy. EXAM: NUCLEAR MEDICINE PET SKULL BASE TO THIGH TECHNIQUE: 11.1 mCi F-18 FDG was injected intravenously. Full-ring PET imaging was performed from the skull base to thigh after the radiotracer. CT data was obtained and used for attenuation correction and anatomic localization. Fasting blood glucose: 103 mg/dl COMPARISON:  None. FINDINGS: Mediastinal blood pool activity: SUV max 1.85 NECK: No hypermetabolic lymph nodes in the neck. Incidental CT findings: none CHEST: No hypermetabolic mediastinal or hilar nodes. No suspicious pulmonary nodules on the CT scan. Incidental CT findings: Linear atelectasis at the lung bases. No suspicious nodules. ABDOMEN/PELVIS: Intense hypermetabolic thickening in the proximal sigmoid colon over approximately 7 cm segment (image 170/4) with SUV max equal 16.5. The thickening is circumferential and obscures the lumen at this level. No there are diverticula through this region on comparison exam. There is inflammatory change within the no pericolonic fat of the upper pelvis. No perforation or abscess present. There is mild inflammation in this region on 2 prior comparison PET-CT scans. In the RIGHT lower quadrant is site of prior tumor involvement of the colon there is moderate hypermetabolic activity associated with the bowel wall without any mass lesion. There is scattered activity throughout the bowel. No hypermetabolic nodes in the abdomen pelvis. Normal metabolic activity the spleen which is normal volume. Normal liver. Incidental CT findings: Mildly dilated fluid-filled loops of small bowel. Favor this ileus related to the inflammatory/process involving the sigmoid colon. No perforation or abscess. No pneumatosis. SKELETON: No  focal hypermetabolic activity to suggest skeletal metastasis. Incidental CT findings: none IMPRESSION: 1. Masslike thickening of the proximal sigmoid colon with pericolonic inflammation and intense hypermetabolic activity. Differential include new lymphoma within the proximal sigmoid colon versus diverticulitis versus colitis related to immunotherapy. There are multiple diverticular through this region. Findings are mildly progressive over the last several scans. There is inflammatory stranding through the pericolonic fat which could suggest a inflammatory process such as diverticulitis. The mass-like imaging characteristics and intense metabolic activity is concerning for lymphoma. 2. Dilatation of the small bowel which  is fluid-filled likely represents mild ileus related to the sigmoid colon inflammatory/neoplastic process. 3. Moderate activity in the RIGHT colon at site of prior lymphoma is nonspecific as there scattered activity throughout the bowel. Electronically Signed: By: Suzy Bouchard M.D. On: 07/28/2018 13:49   Dg Abd Portable 1v-small Bowel Obstruction Protocol-initial, 8 Hr Delay  Result Date: 07/31/2018 CLINICAL DATA:  Small-bowel obstruction, 8 hour delayed image. EXAM: PORTABLE ABDOMEN - 1 VIEW COMPARISON:  07/30/2018 FINDINGS: Moderate contrast distention of the gastric fundus. There are dilated gas-filled small bowel loops scattered throughout the abdomen and pelvis consistent with small bowel obstruction. There are contrast distended small bowel loops in the left hemiabdomen with what appear to be some enteric contrast within large bowel loops in the right upper quadrant. Findings would suggest a high grade partial SBO accounting for this pattern. No free air is noted. No organomegaly. No acute osseous appearing. IMPRESSION: Contrast noted within the stomach, small bowel loops in the left hemiabdomen and admixed with stool within large bowel loops in the right upper quadrant. Findings  would suggest a high-grade partial SBO. Electronically Signed   By: Ashley Royalty M.D.   On: 07/31/2018 22:02    ASSESSMENT & PLAN:   62 y.o. male with  1. 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 No BM or CNS involvement.   04/18/18 PET/CT revealed Continued improvement in right colonic wall thickening and surrounding bulky masses consistent with treated lymphoma. No hypermetabolic activity to suggest residual tumor. There is some hypermetabolic activity associated with the sigmoid colon which demonstrates mild wall thickening, surrounding inflammation and underlying diverticulosis, suggesting mild diverticulitis. Suspected treatment related changes throughout the bone marrow. There is mildly increased activity within the clivus without clear corresponding finding on the CT images. Attention on follow-up recommended. Nonobstructing right renal calculi. 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   06/09/18 PET/CT revealedSoft tissue lesion within the ventral pelvis is again identified demonstrating mild to moderate increased uptake within SUV max of 5.61. Deauville criteria 4. Concerning for residual metabolically active tumor. 2. Similar appearance of diffusely thickened appendix within the right lower quadrant of the abdomen. Findings may reflect treatment related changes. 3. Increased radiotracer uptake throughout the bone marrow which is favored to represent treatment related  changes. 4. Lad coronary artery atherosclerotic calcifications.   2. 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  3.s/pGI bleeding from lymphoma involving bowels-- resolved at this time -- will need to monitor with anticoagulation.  4. DM2 with some increased hyperglycemia with steroids Plan -monitor  -continue Oral hypoglycemics and carb consistent diet at this time.   5. DVT prophylaxis - patient on therapeutic Eliquis, ambulation, SCD  6. Chemotherapy related fatigue - maintain good po hydration and food intake and keeping physically active.    7. Small bowel obstruction/ileus with sigmoid thickening from diverticulitis (vs colitis) vs less likely lymphoma progression. LDH WNL. PLAN: - -Discussed pt labwork today, 08/01/18; Potassium low at 3.1, HGB slightly lower at 8.2, blood counts and chemistries are otherwise stable  -bowel rest -IVF -  NGT decompression -surgery following -Empiric antibiotics to cover for diverticulitis -transfuse to maintain hgb >9 in the setting of multiple metabolic stressors and risk of bleeding in the setting of diverticulitis and anticoagulation. -replace electrolytes aggressively to maintain K>4 and magnesium -Discussed and reviewed the 07/28/18 PET/CT which revealed Masslike thickening of the proximal sigmoid colon with pericolonic inflammation and intense hypermetabolic activity. Differential include new lymphoma within the proximal sigmoid colon versus diverticulitis versus colitis related to immunotherapy. There are multiple diverticular through this region. Findings are mildly progressive over the last several scans. There is inflammatory stranding through the pericolonic fat which could suggest a inflammatory process such as diverticulitis. The mass-like imaging characteristics and intense metabolic activity is concerning for lymphoma. 2. Dilatation of the small bowel which is fluid-filled likely represents mild ileus related to the sigmoid colon  inflammatory/neoplastic process. 3. Moderate activity in the RIGHT colon at site of prior lymphoma is nonspecific as there scattered activity throughout the bowel. -Discussed my concern for a diverticulitis-like picture related to the compression of his bowels  --Continue antibiotics and IVF replacement  -Pt not completely obstructed, with frequent diarrhea  -After current inflammation resolves, might need sigmoidoscopy if deemed safe to evaluate findings in the sigmoid colon -- might need dangerous in the setting of possible diverticulitis due to risk of bowel injury at this time. -Will order stool studies - rpt c diff given abx use, rpt GI panel   The total time spent in the appt was 35 minutes and more than 50% was on counseling and direct patient cares.    Sullivan Lone MD MS AAHIVMS [facility] Hematology/Oncology Physician [facility]  (Office):       [phone] (Work cell):  [phone] (Fax):           [phone]  08/01/2018 5:42 PM   I, Baldwin Jamaica, am acting as a scribe for Dr. Sullivan Lone.   .I have reviewed the above documentation for accuracy and completeness, and I agree with the above. Sullivan Lone MD MS

## 2018-08-01 NOTE — Progress Notes (Signed)
Pharmacy - IV heparin  Assessment:    Please see note from Netta Cedars, PharmD earlier today for full details. Briefly, 62 y.o. male on IV heparin for Hx VTE, on Eliquis PTA (now with SBO)   Most recent aPTT therapeutic but borderline low at 66 sec on 2050 units/hr; unchanged from prior  No bleeding or line issues per RN  Plan:   Increase heparin rate to 2100 units/hr to keep from dropping low  Daily aPTT, HL  Reuel Boom, PharmD, BCPS [phone] 08/01/2018, 8:49 PM

## 2018-08-01 NOTE — Progress Notes (Signed)
Oncology short note  Noted patient admission. Will be seeing patient today. Ileus vs distal from scarring related to adhesions from treated lymphoma with concerns for diveriticulitis/appendicitis. Cannot r/o progressive lymphoma though considered less likely. PLAN -bowel rest -IVF -NGT decompression -surgery following -Empiric antibiotics to cover for diverticulitis -transfuse to maintain hgb >9 in the setting of multiple metabolic stressors and risk of bleeding in the setting of diverticulitis and anticoagulation. -replace electrolytes aggressively to maintain K>4 and magnesium -will see today and follow along.  Sullivan Lone MD MS

## 2018-08-02 DIAGNOSIS — K5732 Diverticulitis of large intestine without perforation or abscess without bleeding: Secondary | ICD-10-CM

## 2018-08-02 DIAGNOSIS — K56609 Unspecified intestinal obstruction, unspecified as to partial versus complete obstruction: Secondary | ICD-10-CM

## 2018-08-02 LAB — GLUCOSE, CAPILLARY
GLUCOSE-CAPILLARY: 74 mg/dL (ref 70–99)
GLUCOSE-CAPILLARY: 90 mg/dL (ref 70–99)
GLUCOSE-CAPILLARY: 86 mg/dL (ref 70–99)
Glucose-Capillary: 107 mg/dL — ABNORMAL HIGH (ref 70–99)
Glucose-Capillary: 77 mg/dL (ref 70–99)
Glucose-Capillary: 79 mg/dL (ref 70–99)

## 2018-08-02 LAB — BASIC METABOLIC PANEL
Anion gap: 5 (ref 5–15)
BUN: 8 mg/dL (ref 8–23)
CALCIUM: 8.1 mg/dL — AB (ref 8.9–10.3)
CO2: 25 mmol/L (ref 22–32)
CREATININE: 0.55 mg/dL — AB (ref 0.61–1.24)
Chloride: 109 mmol/L (ref 98–111)
GFR calc non Af Amer: >60 mL/min (ref >60)
Glucose, Bld: 94 mg/dL (ref 70–99)
Potassium: 2.9 mmol/L — ABNORMAL LOW (ref 3.5–5.1)
SODIUM: 139 mmol/L (ref 135–145)

## 2018-08-02 LAB — C DIFFICILE QUICK SCREEN W PCR REFLEX
C DIFFICILE (CDIFF) INTERP: NOT DETECTED
C DIFFICILE (CDIFF) TOXIN: NEGATIVE
C DIFFICLE (CDIFF) ANTIGEN: NEGATIVE

## 2018-08-02 LAB — CBC
HCT: 29.6 % — ABNORMAL LOW (ref 39.0–52.0)
Hemoglobin: 8.9 g/dL — ABNORMAL LOW (ref 13.0–17.0)
MCH: 30.2 pg (ref 26.0–34.0)
MCHC: 30.1 g/dL (ref 30.0–36.0)
MCV: 100.3 fL — ABNORMAL HIGH (ref 80.0–100.0)
NRBC: 0 % (ref 0.0–0.2)
PLATELETS: 327 10*3/uL (ref 150–400)
RBC: 2.95 MIL/uL — AB (ref 4.22–5.81)
RDW: 19.9 % — ABNORMAL HIGH (ref 11.5–15.5)
WBC: 7 10*3/uL (ref 4.0–10.5)

## 2018-08-02 LAB — TYPE AND SCREEN
ABO/RH(D): O POS
ANTIBODY SCREEN: NEGATIVE
Unit division: 0

## 2018-08-02 LAB — BPAM RBC
BLOOD PRODUCT EXPIRATION DATE: 201912172359
ISSUE DATE / TIME: 201911192105
UNIT TYPE AND RH: 5100

## 2018-08-02 LAB — APTT: aPTT: 78 seconds — ABNORMAL HIGH (ref 24–36)

## 2018-08-02 LAB — MAGNESIUM: MAGNESIUM: 1.6 mg/dL — AB (ref 1.7–2.4)

## 2018-08-02 LAB — HEPARIN LEVEL (UNFRACTIONATED): HEPARIN UNFRACTIONATED: 0.6 [IU]/mL (ref 0.30–0.70)

## 2018-08-02 MED ORDER — POTASSIUM CHLORIDE 10 MEQ/100ML IV SOLN
10.0000 meq | INTRAVENOUS | Status: AC
  Administered 2018-08-02 (×6): 10 meq via INTRAVENOUS
  Filled 2018-08-02 (×6): qty 100

## 2018-08-02 MED ORDER — POTASSIUM CHLORIDE CRYS ER 20 MEQ PO TBCR
40.0000 meq | EXTENDED_RELEASE_TABLET | Freq: Four times a day (QID) | ORAL | Status: DC
  Filled 2018-08-02: qty 2

## 2018-08-02 MED ORDER — MAGNESIUM SULFATE 2 GM/50ML IV SOLN
2.0000 g | Freq: Once | INTRAVENOUS | Status: AC
  Administered 2018-08-02: 2 g via INTRAVENOUS
  Filled 2018-08-02: qty 50

## 2018-08-02 NOTE — Progress Notes (Signed)
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 -small bowel protocol with gastrografin per general surgery, remain on ng suction -will follow general surgery recommendation 08/02/2018: Bowel movement reported.  Patient also reported breaking weight.  On clear liquids for now.  Surgical team is directing care.  Potassium of 2.9 and magnesium of 1.6 noted, will replete.  Burkitt lymphoma  -status post cycle 6 of EPOCH-R dosed on 07/03/2018  -oncology Dr [name] consulted  Severe hypokalemia/hypomagnesemia: Replace k/mag Keep on tele until k/mag improves 08/02/2018: See above.  Macrocytic anemia in the setting of malignancy: hgb above 9 per oncology Will transfuse prbc x1 on 11/19 08/02/2018: Hemoglobin is 8.9 g/dL today.  noninsulin dependent dm2, controlled -home oral meds held -on ssi here  Patient started have hypoglycemia 11/19 pm, ivf changed to 1/2saline/d5/20k.  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    Code Status: full  Family Communication: patient and wife at bedside  Disposition Plan: not ready to discharge   Consultants:  General  surgery  oncology  Procedures:  Ng placement  Antibiotics:  zosyn   Objective: BP 129/84 (BP Location: Right Arm)   Pulse 89   Temp 97.9 F (36.6 C) (Oral)   Resp 14   Ht 5' 9"  (1.753 m)   Wt 99.4 kg   SpO2 97%   BMI 32.36 kg/m   Intake/Output Summary (Last 24 hours) at 08/02/2018 1032 Last data filed at 08/02/2018 0940 Gross per 24 hour  Intake 2099.25 ml  Output 850 ml  Net 1249.25 ml   Filed Weights   07/30/18 2102  Weight: 99.4 kg    Exam: Patient is examined daily including today on 08/02/2018, exams remain the same as of yesterday except that has changed    General:  Pale, NAD  Cardiovascular: RRR  Respiratory: CTABL  Abdomen: Soft/ND/NT,  Decreased BS  Musculoskeletal: Edema, mild.    Neuro: alert, oriented   Data Reviewed: Basic Metabolic Panel: Recent Labs  Lab 07/30/18 1531 07/30/18 1729 07/31/18 0434 08/01/18 0131 08/02/18 0453  NA 140  --  142 142 139  K 2.6*  --  3.0* 3.1* 2.9*  CL 102  --  102 105 109  CO2 27  --  30 28 25   GLUCOSE 146*  --  129* 113* 94  BUN 16  --  12 14 8   CREATININE 0.59*  --  0.52* 0.60* 0.55*  CALCIUM 8.5*  --  8.4* 8.3* 8.1*  MG  --  1.3* 1.7 1.9 1.6*   Liver Function Tests: Recent Labs  Lab 07/30/18 1531 07/31/18 0434  AST 15 12*  ALT 12 11  ALKPHOS 55 48  BILITOT  0.5 0.6  PROT 6.1* 5.4*  ALBUMIN 3.2* 2.9*   Recent Labs  Lab 07/30/18 1531  LIPASE 18   No results for input(s): AMMONIA in the last 168 hours. CBC: Recent Labs  Lab 07/30/18 1531 07/31/18 0434 08/01/18 0131 08/02/18 0453  WBC 9.3 9.3 8.6 7.0  NEUTROABS 7.3 7.3  --   --   HGB 9.6* 8.5* 8.2* 8.9*  HCT 31.8* 28.3* 27.6* 29.6*  MCV 101.3* 102.2* 104.5* 100.3*  PLT 433* 396 438* 327   Cardiac Enzymes:   No results for input(s): CKTOTAL, CKMB, CKMBINDEX, TROPONINI in the last 168 hours. BNP (last 3 results) No results for input(s): BNP in the last 8760 hours.  ProBNP (last 3 results) No results for input(s):  PROBNP in the last 8760 hours.  CBG: Recent Labs  Lab 08/01/18 2050 08/01/18 2118 08/02/18 0015 08/02/18 0421 08/02/18 0737  GLUCAP 67* 99 77 79 74    No results found for this or any previous visit (from the past 240 hour(s)).   Studies: Dg Abd 1 View  Result Date: 08/01/2018 CLINICAL DATA:  Small-bowel obstruction. EXAM: ABDOMEN - 1 VIEW COMPARISON:  07/31/2018.  CT 07/30/2018. FINDINGS: Small amount of oral contrast noted in duodenum, proximal small bowel, this is progressed from the stomach from prior day's exam. This is progressed from prior exam of 07/31/2018. Air-fluid levels are noted throughout small and large bowel. Again these findings are consistent with possible small and large bowel obstruction as noted on recent CT. No free air is identified. No acute bony abnormality. IMPRESSION: 1. Progression of oral contrast into duodenum/proximal small bowel from prior day's exam. 2. Persistent dilated small and large bowel. Again these findings are consistent with possible small and large bowel obstruction is noted on recent CT. No free air. Electronically Signed   By: Marcello Moores  Register   On: 08/01/2018 13:01    Scheduled Meds: . insulin aspart  0-9 Units Subcutaneous Q4H  . potassium chloride  40 mEq Oral Q6H    Continuous Infusions: . dextrose 5 % and 0.45 % NaCl with KCl 20 mEq/L 75 mL/hr at 08/02/18 0600  . heparin 2,100 Units/hr (08/02/18 0717)  . magnesium sulfate 1 - 4 g bolus IVPB    . piperacillin-tazobactam (ZOSYN)  IV 3.375 g (08/02/18 8882)     Time spent: 67mns I have personally reviewed and interpreted on  08/02/2018 daily labs, tele strips, imagings as discussed above under date review session and assessment and plans.  I reviewed all nursing notes, pharmacy notes, consultant notes,  vitals, pertinent old records  I have discussed plan of care as described above with RN , patient and family on 08/02/2018   SBonnell PublicMD. [facility] Pager [phone] If 7PM-7AM, please contact night-coverage at [email or web address], password [facility]11/20/2019, 10:32 AM  LOS: 3 days

## 2018-08-02 NOTE — Progress Notes (Signed)
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 The thickening is circumferential and obscures the lumen at this level. Differential include new lymphoma within the proximal sigmoid colon versus diverticulitis versus colitis related to immunotherapy. - CT scan showed dilated appendix with multiple appendicoliths. Mild periappendiceal soft tissue stranding. 7cm long segment of sigmoid thickening with mild periocolonic soft tissue stranding.  - etiology of diverticulitis vs appendicitis vs post treatment changes vs new malignancy - due to recent admission we will do the small bowel protocol with gastrografin even in the setting of pt's recent BM's. - 8hr delay film showed high-grade partial SBO with small amt of contrast in the colon, repeat xray showed persistent dilated small and large bowel  FEN:CLD VTE: SCD's,heparin TD:SKAJG 11/17>> Foley:none Follow up:TBD  DISPO:continue IV Zosyn, NGT out, clears, 14 days total of abx and full liquids at time of discharge.  I spoke to wife   LOS: 3 days    Subjective: CC: SBO   No abdominal pain. Having lots of loose BM's. He is hungry. No issues overnight. [name] saw him and spoke to Dr. Hassell Done. Per Dr. Hassell Done, we will treat conservatively with abx and keep pt on full liquids even at discharge.   Objective: Vital signs in last 24 hours: Temp:  [97.4 F (36.3 C)-98 F (36.7 C)] 97.9 F (36.6 C) (11/20 0416) Pulse Rate:  [89-100] 89 (11/20 0416) Resp:  [14-19] 14 (11/20 0416) BP: (126-132)/(77-84) 129/84 (11/20 0416) SpO2:  [94 %-97 %] 97 % (11/20 0416) Last BM  Date: 08/01/18  Intake/Output from previous day: 11/19 0701 - 11/20 0700 In: 2109.3 [I.V.:1044.5; Blood:332; NG/GT:60; IV Piggyback:672.8] Out: 700 [Urine:500; Emesis/NG output:200] Intake/Output this shift: Total I/O In: -  Out: 150 [Urine:150]  PE: Gen: Alert, NAD, pleasant, cooperative Pulm:Rate andeffort normal Abd: Soft,obese, mild distention, NT,+BS Skin: no rashes noted, warm and dry    Anti-infectives: Anti-infectives (From admission, onward)   Start     Dose/Rate Route Frequency Ordered Stop   07/31/18 0800  piperacillin-tazobactam (ZOSYN) IVPB 3.375 g     3.375 g 12.5 mL/hr over 240 Minutes Intravenous Every 8 hours 07/31/18 0231     07/30/18 2200  piperacillin-tazobactam (ZOSYN) IVPB 3.375 g     3.375 g 100 mL/hr over 30 Minutes Intravenous  Once 07/30/18 2149 07/31/18 0200      Lab Results:  Recent Labs    08/01/18 0131 08/02/18 0453  WBC 8.6 7.0  HGB 8.2* 8.9*  HCT 27.6* 29.6*  PLT 438* 327   BMET Recent Labs    08/01/18 0131 08/02/18 0453  NA 142 139  K 3.1* 2.9*  CL 105 109  CO2 28 25  GLUCOSE 113* 94  BUN 14 8  CREATININE 0.60* 0.55*  CALCIUM 8.3* 8.1*   PT/INR Recent Labs    07/30/18 2353  LABPROT 15.1  INR 1.20   CMP     Component Value Date/Time   NA 139 08/02/2018 0453   K 2.9 (L) 08/02/2018 0453   CL 109 08/02/2018 0453   CO2 25 08/02/2018 0453   GLUCOSE 94 08/02/2018 0453   BUN 8 08/02/2018 0453   CREATININE  0.55 (L) 08/02/2018 0453   CREATININE 0.89 07/17/2018 1150   CREATININE 1.26 12/01/2012 1700   CALCIUM 8.1 (L) 08/02/2018 0453   PROT 5.4 (L) 07/31/2018 0434   ALBUMIN 2.9 (L) 07/31/2018 0434   AST 12 (L) 07/31/2018 0434   AST 13 (L) 07/17/2018 1150   ALT 11 07/31/2018 0434   ALT 15 07/17/2018 1150   ALKPHOS 48 07/31/2018 0434   BILITOT 0.6 07/31/2018 0434   BILITOT 0.3 07/17/2018 1150   GFRNONAA >60 08/02/2018 0453   GFRNONAA >60 07/17/2018 1150   GFRAA >60 08/02/2018 0453   GFRAA >60 07/17/2018  1150   Lipase     Component Value Date/Time   LIPASE 18 07/30/2018 1531    Studies/Results: Dg Abd 1 View  Result Date: 08/01/2018 CLINICAL DATA:  Small-bowel obstruction. EXAM: ABDOMEN - 1 VIEW COMPARISON:  07/31/2018.  CT 07/30/2018. FINDINGS: Small amount of oral contrast noted in duodenum, proximal small bowel, this is progressed from the stomach from prior day's exam. This is progressed from prior exam of 07/31/2018. Air-fluid levels are noted throughout small and large bowel. Again these findings are consistent with possible small and large bowel obstruction as noted on recent CT. No free air is identified. No acute bony abnormality. IMPRESSION: 1. Progression of oral contrast into duodenum/proximal small bowel from prior day's exam. 2. Persistent dilated small and large bowel. Again these findings are consistent with possible small and large bowel obstruction is noted on recent CT. No free air. Electronically Signed   By: Marcello Moores  Register   On: 08/01/2018 13:01   Dg Abd Portable 1v-small Bowel Obstruction Protocol-initial, 8 Hr Delay  Result Date: 07/31/2018 CLINICAL DATA:  Small-bowel obstruction, 8 hour delayed image. EXAM: PORTABLE ABDOMEN - 1 VIEW COMPARISON:  07/30/2018 FINDINGS: Moderate contrast distention of the gastric fundus. There are dilated gas-filled small bowel loops scattered throughout the abdomen and pelvis consistent with small bowel obstruction. There are contrast distended small bowel loops in the left hemiabdomen with what appear to be some enteric contrast within large bowel loops in the right upper quadrant. Findings would suggest a high grade partial SBO accounting for this pattern. No free air is noted. No organomegaly. No acute osseous appearing. IMPRESSION: Contrast noted within the stomach, small bowel loops in the left hemiabdomen and admixed with stool within large bowel loops in the right upper quadrant. Findings would suggest a high-grade partial SBO.  Electronically Signed   By: Ashley Royalty M.D.   On: 07/31/2018 22:02      Kalman Drape , [facility] 08/02/2018, 9:05 AM  Pager: [phone] Mon-Wed, Friday 7:00am-4:30pm Thurs 7am-11:30am  Consults: [phone]

## 2018-08-02 NOTE — Plan of Care (Signed)

## 2018-08-02 NOTE — Progress Notes (Signed)
[place] for IV heparin Indication: Hx VTE on Eliquis PTA  Allergies  Allergen Reactions  . Ciprofloxacin Other (See Comments)    Leg tingling    Patient Measurements: Height: 5' 9"  (175.3 cm) Weight: 219 lb 2.2 oz (99.4 kg) IBW/kg (Calculated) : 70.7 Heparin Dosing Weight: 91.7 kg  Vital Signs: Temp: 97.9 F (36.6 C) (11/20 0416) Temp Source: Oral (11/20 0416) BP: 129/84 (11/20 0416) Pulse Rate: 89 (11/20 0416)  Labs: Recent Labs    07/30/18 2353 07/31/18 0434  08/01/18 0129 08/01/18 0131 08/01/18 1019 08/01/18 1950 08/02/18 0453  HGB  --  8.5*  --   --  8.2*  --   --  8.9*  HCT  --  28.3*  --   --  27.6*  --   --  29.6*  PLT  --  396  --   --  438*  --   --  327  APTT 28  --    < > 65*  --  66* 66* 78*  LABPROT 15.1  --   --   --   --   --   --   --   INR 1.20  --   --   --   --   --   --   --   HEPARINUNFRC 1.44*  --    < > 1.32*  --  1.06*  --  0.60  CREATININE  --  0.52*  --   --  0.60*  --   --  0.55*   < > = values in this interval not displayed.    Estimated Creatinine Clearance: 111.3 mL/min (A) (by C-G formula based on SCr of 0.55 mg/dL (L)).  Medications:  Medications Prior to Admission  Medication Sig Dispense Refill Last Dose  . apixaban (ELIQUIS) 5 MG TABS tablet Take 1 tablet (5 mg total) by mouth 2 (two) times daily. 60 tablet 3 07/30/2018 at 0900  . blood glucose meter kit and supplies Dispense based on patient and insurance preference. Use daily as directed. (E11.9). 1 each 0 Taking  . Choline Fenofibrate 135 MG capsule TAKE 1 CAPSULE DAILY (Patient taking differently: Take 135 mg by mouth every evening. ) 90 capsule 1 Past Week at Unknown time  . furosemide (LASIX) 20 MG tablet TAKE 1 TABLET BY MOUTH DAILY HOLD AFTER 10-15 DAYS IF LEG SWELLING RESOLVED OR IF >10LBS WEIGHT LOSS (Patient taking differently: Take 20 mg by mouth daily as needed for fluid or edema. ) 30 tablet 0 Past Month at Unknown time  .  glucose blood (FREESTYLE TEST STRIPS) test strip Use to check blood sugar daily 100 each 4 Taking  . HYDROcodone-acetaminophen (NORCO) 5-325 MG tablet Take 1-2 tablets by mouth every 4 (four) hours as needed for moderate pain. 30 tablet 0 Past Month at Unknown time  . JANUMET 50-1000 MG tablet TAKE 1 TABLET TWICE DAILY  WITH MEALS (Patient taking differently: Take 1 tablet by mouth 2 (two) times daily with a meal. ) 180 tablet 3 Past Week at Unknown time  . lidocaine-prilocaine (EMLA) cream Apply 1 application topically as needed. (Patient taking differently: Apply 1 application topically daily as needed (access port). ) 30 g 2 Past Month at Unknown time  . Magnesium 500 MG CAPS Take 1 capsule (500 mg total) by mouth 2 (two) times daily. (Patient taking differently: Take 500 mg by mouth every evening. ) 60 capsule 0 Past Month at Unknown time  . senna-docusate (  SENOKOT-S) 8.6-50 MG tablet Take 2 tablets by mouth at bedtime. (Patient taking differently: Take 2 tablets by mouth daily as needed for mild constipation. ) 60 tablet 1 07/29/2018 at Unknown time  . metroNIDAZOLE (FLAGYL) 500 MG tablet Take 1 tablet (500 mg total) by mouth 3 (three) times daily. (Patient not taking: Reported on 07/30/2018) 21 tablet 0 Not Taking at Unknown time  . ondansetron (ZOFRAN) 8 MG tablet Take 1 tablet (8 mg total) by mouth every 8 (eight) hours as needed for nausea or vomiting. (Patient not taking: Reported on 07/30/2018) 20 tablet 0 Not Taking at Unknown time   Scheduled:  . insulin aspart  0-9 Units Subcutaneous Q4H   PRN: acetaminophen **OR** acetaminophen, ondansetron **OR** ondansetron (ZOFRAN) IV, sodium chloride flush  Assessment: 13 yoM with PMH DVT on Eliquis, lymphoma on chemo, HTN, DM2 admitted for SBO. Conservative management at this time per Surgery. Pharmacy to dose IV heparin while Eliquis on hold.   Baseline INR, aPTT: WNL  Prior anticoagulation: Eliquis 5 mg bid, LD 11/17 0900 AM  Significant  events:  Today, 08/02/2018:  CBC: Hgb essentially unchanged (8.9) after transfusion yesterday  aPTT level therapeutic (78 sec) on 2100 units/hr  Heparin level 0.6 - now in therapeutic range and correlating with aPTT, indicating effects of Eliquis have worn off.  No bleeding or infusion issues per nursing  CrCl: > 90 ml/min  Goal of Therapy: aPTT goal 66-102 sec Heparin level 0.3-0.7 units/ml Monitor platelets by anticoagulation protocol: Yes  Plan:  Continue heparin IV infusion at 2100 units/hr  Will now monitor heparin using heparin levels only  Daily CBC and heparin level  Monitor for signs of bleeding or thrombosis  Peggyann Juba, PharmD, BCPS Pager: [phone] 08/02/2018, 6:57 AM

## 2018-08-03 LAB — GASTROINTESTINAL PANEL BY PCR, STOOL (REPLACES STOOL CULTURE)
Adenovirus F40/41: NOT DETECTED
Astrovirus: NOT DETECTED
CAMPYLOBACTER SPECIES: NOT DETECTED
CRYPTOSPORIDIUM: NOT DETECTED
Cyclospora cayetanensis: NOT DETECTED
ENTEROPATHOGENIC E COLI (EPEC): NOT DETECTED
Entamoeba histolytica: NOT DETECTED
Enteroaggregative E coli (EAEC): NOT DETECTED
Enterotoxigenic E coli (ETEC): NOT DETECTED
Giardia lamblia: NOT DETECTED
NOROVIRUS GI/GII: NOT DETECTED
PLESIMONAS SHIGELLOIDES: NOT DETECTED
ROTAVIRUS A: NOT DETECTED
SALMONELLA SPECIES: NOT DETECTED
SHIGELLA/ENTEROINVASIVE E COLI (EIEC): NOT DETECTED
Sapovirus (I, II, IV, and V): NOT DETECTED
Shiga like toxin producing E coli (STEC): NOT DETECTED
Vibrio cholerae: NOT DETECTED
Vibrio species: NOT DETECTED
YERSINIA ENTEROCOLITICA: NOT DETECTED

## 2018-08-03 LAB — CBC WITH DIFFERENTIAL/PLATELET
Abs Immature Granulocytes: 0.11 10*3/uL — ABNORMAL HIGH (ref 0.00–0.07)
Basophils Absolute: 0 10*3/uL (ref 0.0–0.1)
Basophils Relative: 0 %
Eosinophils Absolute: 0 10*3/uL (ref 0.0–0.5)
Eosinophils Relative: 0 %
HCT: 28.8 % — ABNORMAL LOW (ref 39.0–52.0)
Hemoglobin: 8.7 g/dL — ABNORMAL LOW (ref 13.0–17.0)
Immature Granulocytes: 2 %
Lymphocytes Relative: 9 %
Lymphs Abs: 0.5 10*3/uL — ABNORMAL LOW (ref 0.7–4.0)
MCH: 30.1 pg (ref 26.0–34.0)
MCHC: 30.2 g/dL (ref 30.0–36.0)
MCV: 99.7 fL (ref 80.0–100.0)
Monocytes Absolute: 0.5 10*3/uL (ref 0.1–1.0)
Monocytes Relative: 10 %
Neutro Abs: 4.2 10*3/uL (ref 1.7–7.7)
Neutrophils Relative %: 79 %
Platelets: 278 10*3/uL (ref 150–400)
RBC: 2.89 MIL/uL — ABNORMAL LOW (ref 4.22–5.81)
RDW: 19.3 % — ABNORMAL HIGH (ref 11.5–15.5)
WBC: 5.3 10*3/uL (ref 4.0–10.5)
nRBC: 0 % (ref 0.0–0.2)

## 2018-08-03 LAB — RENAL FUNCTION PANEL
Albumin: 2.5 g/dL — ABNORMAL LOW (ref 3.5–5.0)
Anion gap: 9 (ref 5–15)
BUN: <5 mg/dL — ABNORMAL LOW (ref 8–23)
CO2: 22 mmol/L (ref 22–32)
Calcium: 7.9 mg/dL — ABNORMAL LOW (ref 8.9–10.3)
Chloride: 105 mmol/L (ref 98–111)
Creatinine, Ser: 0.52 mg/dL — ABNORMAL LOW (ref 0.61–1.24)
GFR calc Af Amer: >60 mL/min (ref >60)
GFR calc non Af Amer: >60 mL/min (ref >60)
Glucose, Bld: 101 mg/dL — ABNORMAL HIGH (ref 70–99)
Phosphorus: 2.9 mg/dL (ref 2.5–4.6)
Potassium: 3.1 mmol/L — ABNORMAL LOW (ref 3.5–5.1)
Sodium: 136 mmol/L (ref 135–145)

## 2018-08-03 LAB — MAGNESIUM: Magnesium: 1.5 mg/dL — ABNORMAL LOW (ref 1.7–2.4)

## 2018-08-03 LAB — GLUCOSE, CAPILLARY
GLUCOSE-CAPILLARY: 97 mg/dL (ref 70–99)
GLUCOSE-CAPILLARY: 96 mg/dL (ref 70–99)
GLUCOSE-CAPILLARY: 110 mg/dL — AB (ref 70–99)
Glucose-Capillary: 96 mg/dL (ref 70–99)
Glucose-Capillary: 94 mg/dL (ref 70–99)
Glucose-Capillary: 100 mg/dL — ABNORMAL HIGH (ref 70–99)

## 2018-08-03 LAB — LACTOFERRIN, FECAL, QUALITATIVE: LACTOFERRIN, FECAL, QUAL: POSITIVE — AB

## 2018-08-03 LAB — HEPARIN LEVEL (UNFRACTIONATED): Heparin Unfractionated: 0.47 IU/mL (ref 0.30–0.70)

## 2018-08-03 MED ORDER — POTASSIUM CHLORIDE CRYS ER 20 MEQ PO TBCR
40.0000 meq | EXTENDED_RELEASE_TABLET | Freq: Three times a day (TID) | ORAL | Status: AC
  Administered 2018-08-03 – 2018-08-04 (×3): 40 meq via ORAL
  Filled 2018-08-03 (×3): qty 2

## 2018-08-03 MED ORDER — MAGNESIUM SULFATE 2 GM/50ML IV SOLN
2.0000 g | Freq: Once | INTRAVENOUS | Status: AC
  Administered 2018-08-03: 2 g via INTRAVENOUS
  Filled 2018-08-03: qty 50

## 2018-08-03 MED ORDER — APIXABAN 5 MG PO TABS
5.0000 mg | ORAL_TABLET | Freq: Two times a day (BID) | ORAL | Status: DC
  Administered 2018-08-03 – 2018-08-04 (×3): 5 mg via ORAL
  Filled 2018-08-03 (×3): qty 1

## 2018-08-03 MED ORDER — AMOXICILLIN-POT CLAVULANATE 875-125 MG PO TABS
1.0000 | ORAL_TABLET | Freq: Two times a day (BID) | ORAL | Status: DC
  Administered 2018-08-03 – 2018-08-04 (×3): 1 via ORAL
  Filled 2018-08-03 (×3): qty 1

## 2018-08-03 NOTE — Progress Notes (Signed)
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 The thickening is circumferential and obscures the lumen at this level. Differential include new lymphoma within the proximal sigmoid colon versus diverticulitis versus colitis related to immunotherapy. - CT scan showed dilated appendix with multiple appendicoliths. Mild periappendiceal soft tissue stranding. 7cm long segment of sigmoid thickening with mild periocolonic soft tissue stranding.  - etiology of diverticulitis vs appendicitis vs post treatment changes vs new malignancy - due to recent admission we will do the small bowel protocol with gastrografin even in the setting of pt's recent BM's. - 8hr delay film showed high-grade partial SBO with small amt of contrast in the colon, repeat xray showed persistent dilated small and large bowel - having BM's and tolerating FLD  FEN:FLD VTE: SCD's,heparin PF:XTKWI 11/17>> Foley:none Follow up:TBD  DISPO:Okay to move to PO abx, 14 days total of abx and full liquids at time of discharge. No surgical intervention at this time. Discharge per medicine.  I spoke to wife   LOS: 4 days    Subjective: CC: SBO  No issues overnight. Loose stools are slowing down. No abdominal pain. Tolerating FLD. No nausea or vomiting.   Objective: Vital signs in last 24 hours: Temp:  [97.9 F (36.6 C)-98.3 F (36.8 C)] 98.3 F (36.8 C) (11/21 0973) Pulse Rate:  [97-104] 100 (11/21 0642) Resp:  [16-18] 18 (11/21 0642) BP: (123-131)/(80-81) 127/81 (11/21 0642) SpO2:  [94 %-99 %] 95 % (11/21 0642) Last BM Date:  08/02/18  Intake/Output from previous day: 11/20 0701 - 11/21 0700 In: 3459.1 [P.O.:720; I.V.:1843.2; IV Piggyback:895.9] Out: 1850 [Urine:1650; Stool:200] Intake/Output this shift: No intake/output data recorded.  PE: Gen: Alert, NAD, pleasant, cooperative Pulm:Rate andeffort normal Abd: Soft,obese, mild distention, NT,+BS Skin: no rashes noted, warm and dry   Anti-infectives: Anti-infectives (From admission, onward)   Start     Dose/Rate Route Frequency Ordered Stop   07/31/18 0800  piperacillin-tazobactam (ZOSYN) IVPB 3.375 g     3.375 g 12.5 mL/hr over 240 Minutes Intravenous Every 8 hours 07/31/18 0231     07/30/18 2200  piperacillin-tazobactam (ZOSYN) IVPB 3.375 g     3.375 g 100 mL/hr over 30 Minutes Intravenous  Once 07/30/18 2149 07/31/18 0200      Lab Results:  Recent Labs    08/02/18 0453 08/03/18 0426  WBC 7.0 5.3  HGB 8.9* 8.7*  HCT 29.6* 28.8*  PLT 327 278   BMET Recent Labs    08/02/18 0453 08/03/18 0426  NA 139 136  K 2.9* 3.1*  CL 109 105  CO2 25 22  GLUCOSE 94 101*  BUN 8 <5*  CREATININE 0.55* 0.52*  CALCIUM 8.1* 7.9*   PT/INR No results for input(s): LABPROT, INR in the last 72 hours. CMP     Component Value Date/Time   NA 136 08/03/2018 0426   K 3.1 (L) 08/03/2018 0426   CL 105 08/03/2018 0426   CO2 22 08/03/2018 0426   GLUCOSE 101 (H) 08/03/2018 0426   BUN <5 (L) 08/03/2018 0426   CREATININE 0.52 (L) 08/03/2018 0426   CREATININE 0.89 07/17/2018 1150   CREATININE 1.26 12/01/2012 1700   CALCIUM  7.9 (L) 08/03/2018 0426   PROT 5.4 (L) 07/31/2018 0434   ALBUMIN 2.5 (L) 08/03/2018 0426   AST 12 (L) 07/31/2018 0434   AST 13 (L) 07/17/2018 1150   ALT 11 07/31/2018 0434   ALT 15 07/17/2018 1150   ALKPHOS 48 07/31/2018 0434   BILITOT 0.6 07/31/2018 0434   BILITOT 0.3 07/17/2018 1150   GFRNONAA >60 08/03/2018 0426   GFRNONAA >60 07/17/2018 1150   GFRAA >60 08/03/2018 0426   GFRAA >60 07/17/2018 1150   Lipase      Component Value Date/Time   LIPASE 18 07/30/2018 1531    Studies/Results: Dg Abd 1 View  Result Date: 08/01/2018 CLINICAL DATA:  Small-bowel obstruction. EXAM: ABDOMEN - 1 VIEW COMPARISON:  07/31/2018.  CT 07/30/2018. FINDINGS: Small amount of oral contrast noted in duodenum, proximal small bowel, this is progressed from the stomach from prior day's exam. This is progressed from prior exam of 07/31/2018. Air-fluid levels are noted throughout small and large bowel. Again these findings are consistent with possible small and large bowel obstruction as noted on recent CT. No free air is identified. No acute bony abnormality. IMPRESSION: 1. Progression of oral contrast into duodenum/proximal small bowel from prior day's exam. 2. Persistent dilated small and large bowel. Again these findings are consistent with possible small and large bowel obstruction is noted on recent CT. No free air. Electronically Signed   By: Marcello Moores  Register   On: 08/01/2018 13:01      Kalman Drape , [facility] 08/03/2018, 9:44 AM  Pager: [phone] Mon-Wed, Friday 7:00am-4:30pm Thurs 7am-11:30am  Consults: [phone]

## 2018-08-03 NOTE — Progress Notes (Signed)
[facility] for IV heparin Indication: Hx VTE on Eliquis PTA  Allergies  Allergen Reactions  . Ciprofloxacin Other (See Comments)    Leg tingling    Patient Measurements: Height: 5' 9"  (175.3 cm) Weight: 219 lb 2.2 oz (99.4 kg) IBW/kg (Calculated) : 70.7 Heparin Dosing Weight: 91.7 kg  Vital Signs: Temp: 98.3 F (36.8 C) (11/21 0642) Temp Source: Oral (11/21 0642) BP: 127/81 (11/21 0642) Pulse Rate: 100 (11/21 0642)  Labs: Recent Labs    08/01/18 0131 08/01/18 1019 08/01/18 1950 08/02/18 0453 08/03/18 0426  HGB 8.2*  --   --  8.9* 8.7*  HCT 27.6*  --   --  29.6* 28.8*  PLT 438*  --   --  327 278  APTT  --  66* 66* 78*  --   HEPARINUNFRC  --  1.06*  --  0.60 0.47  CREATININE 0.60*  --   --  0.55* 0.52*    Estimated Creatinine Clearance: 111.3 mL/min (A) (by C-G formula based on SCr of 0.52 mg/dL (L)).  Medications:  Medications Prior to Admission  Medication Sig Dispense Refill Last Dose  . apixaban (ELIQUIS) 5 MG TABS tablet Take 1 tablet (5 mg total) by mouth 2 (two) times daily. 60 tablet 3 07/30/2018 at 0900  . blood glucose meter kit and supplies Dispense based on patient and insurance preference. Use daily as directed. (E11.9). 1 each 0 Taking  . Choline Fenofibrate 135 MG capsule TAKE 1 CAPSULE DAILY (Patient taking differently: Take 135 mg by mouth every evening. ) 90 capsule 1 Past Week at Unknown time  . furosemide (LASIX) 20 MG tablet TAKE 1 TABLET BY MOUTH DAILY HOLD AFTER 10-15 DAYS IF LEG SWELLING RESOLVED OR IF >10LBS WEIGHT LOSS (Patient taking differently: Take 20 mg by mouth daily as needed for fluid or edema. ) 30 tablet 0 Past Month at Unknown time  . glucose blood (FREESTYLE TEST STRIPS) test strip Use to check blood sugar daily 100 each 4 Taking  . HYDROcodone-acetaminophen (NORCO) 5-325 MG tablet Take 1-2 tablets by mouth every 4 (four) hours as needed for moderate pain. 30 tablet 0 Past Month at Unknown time   . JANUMET 50-1000 MG tablet TAKE 1 TABLET TWICE DAILY  WITH MEALS (Patient taking differently: Take 1 tablet by mouth 2 (two) times daily with a meal. ) 180 tablet 3 Past Week at Unknown time  . lidocaine-prilocaine (EMLA) cream Apply 1 application topically as needed. (Patient taking differently: Apply 1 application topically daily as needed (access port). ) 30 g 2 Past Month at Unknown time  . Magnesium 500 MG CAPS Take 1 capsule (500 mg total) by mouth 2 (two) times daily. (Patient taking differently: Take 500 mg by mouth every evening. ) 60 capsule 0 Past Month at Unknown time  . senna-docusate (SENOKOT-S) 8.6-50 MG tablet Take 2 tablets by mouth at bedtime. (Patient taking differently: Take 2 tablets by mouth daily as needed for mild constipation. ) 60 tablet 1 07/29/2018 at Unknown time  . metroNIDAZOLE (FLAGYL) 500 MG tablet Take 1 tablet (500 mg total) by mouth 3 (three) times daily. (Patient not taking: Reported on 07/30/2018) 21 tablet 0 Not Taking at Unknown time  . ondansetron (ZOFRAN) 8 MG tablet Take 1 tablet (8 mg total) by mouth every 8 (eight) hours as needed for nausea or vomiting. (Patient not taking: Reported on 07/30/2018) 20 tablet 0 Not Taking at Unknown time   Scheduled:  . insulin aspart  0-9  Units Subcutaneous Q4H   PRN: acetaminophen **OR** acetaminophen, ondansetron **OR** ondansetron (ZOFRAN) IV, sodium chloride flush  Assessment: 11 yoM with PMH DVT on Eliquis, lymphoma on chemo, HTN, DM2 admitted for SBO. Conservative management at this time per Surgery. Pharmacy to dose IV heparin while Eliquis on hold.   Baseline INR, aPTT: WNL  Prior anticoagulation: Eliquis 5 mg bid, LD 11/17 0900 AM  Significant events:  Today, 08/03/2018:  CBC: Hgb stable (8.7) after transfusion 11/19  Heparin level therapeutic (0.47) on 2100 units/hr  Monitoring heparin levels only since effects of Eliquis have worn off.  No bleeding or infusion issues per nursing  CrCl: > 90  ml/min  Goal of Therapy: aPTT goal 66-102 sec Heparin level 0.3-0.7 units/ml Monitor platelets by anticoagulation protocol: Yes  Plan:  Continue heparin IV infusion at 2100 units/hr  Will now monitor heparin using heparin levels only  Daily CBC and heparin level - note plans to likely resume Eliquis today  Monitor for signs of bleeding or thrombosis  Peggyann Juba, PharmD, BCPS Pager: [phone] 08/03/2018, 7:13 AM

## 2018-08-03 NOTE — Progress Notes (Signed)
PROGRESS NOTE  EMMANUELL Frey ENI:[id number] DOB: 1956/07/27 DOA: 07/30/2018 PCP: Mark Olp, MD  Brief Summary: H/o lymphoma, presented with recurrent sbo  HPI/Recap of past 24 hours: Seen alongside patient's wife. Patient has diarrhea seems to be improving. C. difficile is negative. GI panel is still pending. Patient looks better today. Patient is tolerating liquid diet well. We will change IV Zosyn to oral Augmentin, complete course of antibiotics. No abdominal pain no focal tenderness to suggest appendicitis.  Abdominal exam was benign.  Assessment/Plan: Principal Problem:   SBO (small bowel obstruction) (HCC) Active Problems:   Hypertension   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HCC)   Deep vein thrombosis (DVT) of femoral vein of right lower extremity (HCC)   Hypokalemia   Low magnesium level   Diverticulitis of colon   Recurrent SBO - he was recently hospitalized from 11/8 to 11/11 for the same, patient report he did not feel completely better since discharge -on empirical abx zosyn due to bowel wall thickening on ct scan, infection/maligancy? -small bowel protocol with gastrografin per general surgery, remain on ng suction -will follow general surgery recommendation 08/03/2018: Bowel movement reported.  Stool is becoming more formed.  Patient is on liquid diet.  Surgical team is directing care.  Potassium of 3.1 and magnesium of 1.5 noted, will replete.  Burkitt lymphoma  -status post cycle 6 of EPOCH-R dosed on 07/03/2018  -oncology Dr Mark Frey consulted  Severe hypokalemia/hypomagnesemia: Replace k/mag Keep on tele until k/mag improves 08/03/2018: See above.  Macrocytic anemia in the setting of malignancy: hgb above 9 per oncology Will transfuse prbc x1 on 11/19 08/03/2018: Hemoglobin is 8.7 g/dL today.  noninsulin dependent dm2, controlled -home oral meds held -on ssi here  H/o DVT/PE -on apixaban at home, currently on heparin drip 08/03/2018: DC  heparin and resume Eliquis.  Obesity: Body mass index is 32.36 kg/m.  Code Status: full  Family Communication: Wife.  Disposition Plan: Pursue disposition.   Consultants:  General surgery  oncology  Procedures:  Ng placement  Antibiotics:  Zosyn  IV Zosyn discontinued on 08/03/2018, and Augmentin started.   Objective: BP 127/81 (BP Location: Left Arm)   Pulse 100   Temp 98.3 F (36.8 C) (Oral)   Resp 18   Ht 5' 9"  (1.753 m)   Wt 99.4 kg   SpO2 95%   BMI 32.36 kg/m   Intake/Output Summary (Last 24 hours) at 08/03/2018 1020 Last data filed at 08/03/2018 0600 Gross per 24 hour  Intake 3459.12 ml  Output 1450 ml  Net 2009.12 ml   Filed Weights   07/30/18 2102  Weight: 99.4 kg    Exam: Patient is examined daily including today on 08/03/2018, exams remain the same as of yesterday except that has changed    General:  Pale, NAD  Cardiovascular: RRR  Respiratory: CTABL  Abdomen: Soft/ND/NT  Musculoskeletal: Edema, mild.    Neuro: alert, oriented   Data Reviewed: Basic Metabolic Panel: Recent Labs  Lab 07/30/18 1531 07/30/18 1729 07/31/18 0434 08/01/18 0131 08/02/18 0453 08/03/18 0426  NA 140  --  142 142 139 136  K 2.6*  --  3.0* 3.1* 2.9* 3.1*  CL 102  --  102 105 109 105  CO2 27  --  30 28 25 22   GLUCOSE 146*  --  129* 113* 94 101*  BUN 16  --  12 14 8  <5*  CREATININE 0.59*  --  0.52* 0.60* 0.55* 0.52*  CALCIUM 8.5*  --  8.4*  8.3* 8.1* 7.9*  MG  --  1.3* 1.7 1.9 1.6* 1.5*  PHOS  --   --   --   --   --  2.9   Liver Function Tests: Recent Labs  Lab 07/30/18 1531 07/31/18 0434 08/03/18 0426  AST 15 12*  --   ALT 12 11  --   ALKPHOS 55 48  --   BILITOT 0.5 0.6  --   PROT 6.1* 5.4*  --   ALBUMIN 3.2* 2.9* 2.5*   Recent Labs  Lab 07/30/18 1531  LIPASE 18   No results for input(s): AMMONIA in the last 168 hours. CBC: Recent Labs  Lab 07/30/18 1531 07/31/18 0434 08/01/18 0131 08/02/18 0453 08/03/18 0426  WBC 9.3  9.3 8.6 7.0 5.3  NEUTROABS 7.3 7.3  --   --  4.2  HGB 9.6* 8.5* 8.2* 8.9* 8.7*  HCT 31.8* 28.3* 27.6* 29.6* 28.8*  MCV 101.3* 102.2* 104.5* 100.3* 99.7  PLT 433* 396 438* 327 278   Cardiac Enzymes:   No results for input(s): CKTOTAL, CKMB, CKMBINDEX, TROPONINI in the last 168 hours. BNP (last 3 results) No results for input(s): BNP in the last 8760 hours.  ProBNP (last 3 results) No results for input(s): PROBNP in the last 8760 hours.  CBG: Recent Labs  Lab 08/02/18 0421 08/02/18 0737 08/02/18 1154 08/02/18 1651 08/02/18 2029  GLUCAP 79 74 90 86 107*    Recent Results (from the past 240 hour(s))  C difficile quick scan w PCR reflex     Status: None   Collection Time: 08/02/18 11:33 AM  Result Value Ref Range Status   C Diff antigen NEGATIVE NEGATIVE Final   C Diff toxin NEGATIVE NEGATIVE Final   C Diff interpretation No C. difficile detected.  Final    Comment: Performed at [facility], [place] [address], [place], [place] [address]     Studies: No results found.  Scheduled Meds: . amoxicillin-clavulanate  1 tablet Oral Q12H  . apixaban  5 mg Oral BID  . insulin aspart  0-9 Units Subcutaneous Q4H  . potassium chloride  40 mEq Oral Q8H    Continuous Infusions: . dextrose 5 % and 0.45 % NaCl with KCl 20 mEq/L 75 mL/hr at 08/03/18 0839  . magnesium sulfate 1 - 4 g bolus IVPB       Time spent: 71mns I have personally reviewed and interpreted on  08/03/2018 daily labs, tele strips, imagings as discussed above under date review session and assessment and plans.  I reviewed all nursing notes, pharmacy notes, consultant notes,  vitals, pertinent old records  I have discussed plan of care as described above with RN , patient and family on 08/03/2018   SBonnell PublicMD. [facility] Pager [phone] If 7PM-7AM, please contact night-coverage at [email or web address], password [facility]11/21/2019, 10:20 AM  LOS: 4 days

## 2018-08-03 NOTE — Progress Notes (Signed)
Pt uneventful from 3-7pm, pt stable resting without distress. Pt assessment unchanged.Ambulates to [facility] with one assist. SRP,RN

## 2018-08-03 NOTE — Progress Notes (Signed)
GI Panel negative. Contact precautions discontinued. Patient and wife informed.

## 2018-08-04 ENCOUNTER — Telehealth: Payer: Self-pay | Admitting: *Deleted

## 2018-08-04 LAB — RENAL FUNCTION PANEL
Albumin: 2.7 g/dL — ABNORMAL LOW (ref 3.5–5.0)
Anion gap: 7 (ref 5–15)
BUN: <5 mg/dL — ABNORMAL LOW (ref 8–23)
CO2: 25 mmol/L (ref 22–32)
Calcium: 8.2 mg/dL — ABNORMAL LOW (ref 8.9–10.3)
Chloride: 104 mmol/L (ref 98–111)
Creatinine, Ser: 0.4 mg/dL — ABNORMAL LOW (ref 0.61–1.24)
GFR calc Af Amer: >60 mL/min (ref >60)
GFR calc non Af Amer: >60 mL/min (ref >60)
Glucose, Bld: 111 mg/dL — ABNORMAL HIGH (ref 70–99)
Phosphorus: 3.3 mg/dL (ref 2.5–4.6)
Potassium: 3.5 mmol/L (ref 3.5–5.1)
Sodium: 136 mmol/L (ref 135–145)

## 2018-08-04 LAB — CBC
HEMATOCRIT: 29.5 % — AB (ref 39.0–52.0)
Hemoglobin: 8.9 g/dL — ABNORMAL LOW (ref 13.0–17.0)
MCH: 30.5 pg (ref 26.0–34.0)
MCHC: 30.2 g/dL (ref 30.0–36.0)
MCV: 101 fL — AB (ref 80.0–100.0)
NRBC: 0 % (ref 0.0–0.2)
Platelets: 226 10*3/uL (ref 150–400)
RBC: 2.92 MIL/uL — AB (ref 4.22–5.81)
RDW: 19.3 % — ABNORMAL HIGH (ref 11.5–15.5)
WBC: 5.7 10*3/uL (ref 4.0–10.5)

## 2018-08-04 LAB — MAGNESIUM: Magnesium: 1.9 mg/dL (ref 1.7–2.4)

## 2018-08-04 LAB — BASIC METABOLIC PANEL
ANION GAP: 7 (ref 5–15)
CO2: 25 mmol/L (ref 22–32)
CREATININE: 0.41 mg/dL — AB (ref 0.61–1.24)
Calcium: 8.3 mg/dL — ABNORMAL LOW (ref 8.9–10.3)
Chloride: 104 mmol/L (ref 98–111)
GFR calc Af Amer: >60 mL/min (ref >60)
GLUCOSE: 110 mg/dL — AB (ref 70–99)
POTASSIUM: 3.5 mmol/L (ref 3.5–5.1)
Sodium: 136 mmol/L (ref 135–145)

## 2018-08-04 LAB — GLUCOSE, CAPILLARY
GLUCOSE-CAPILLARY: 99 mg/dL (ref 70–99)
Glucose-Capillary: 115 mg/dL — ABNORMAL HIGH (ref 70–99)
Glucose-Capillary: 124 mg/dL — ABNORMAL HIGH (ref 70–99)

## 2018-08-04 MED ORDER — DIPHENOXYLATE-ATROPINE 2.5-0.025 MG PO TABS: 1.0000 | ORAL_TABLET | Freq: Four times a day (QID) | ORAL | Status: DC | PRN

## 2018-08-04 MED ORDER — DIPHENOXYLATE-ATROPINE 2.5-0.025 MG PO TABS: 1.0000 | ORAL_TABLET | Freq: Four times a day (QID) | ORAL | 0 refills | Status: DC | PRN

## 2018-08-04 MED ORDER — HEPARIN SOD (PORK) LOCK FLUSH 100 UNIT/ML IV SOLN
500.0000 [IU] | INTRAVENOUS | Status: AC | PRN
  Administered 2018-08-04: 500 [IU]

## 2018-08-04 MED ORDER — AMOXICILLIN-POT CLAVULANATE 875-125 MG PO TABS: 1.0000 | ORAL_TABLET | Freq: Two times a day (BID) | ORAL | 0 refills | Status: AC

## 2018-08-04 NOTE — Progress Notes (Signed)
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 The thickening is circumferential and obscures the lumen at this level. Differential include new lymphoma within the proximal sigmoid colon versus diverticulitis versus colitis related to immunotherapy. - CT scan showed dilated appendix with multiple appendicoliths. Mild periappendiceal soft tissue stranding. 7cm long segment of sigmoid thickening with mild periocolonic soft tissue stranding.  - etiology of diverticulitis vs appendicitis vs post treatment changes vs new malignancy - due to recent admission we will do the small bowel protocol with gastrografin even in the setting of pt's recent BM's. - 8hr delay film showed high-grade partial SBO with small amt of contrast in the colon,repeat xray showed persistent dilated small and large bowel - having BM's and tolerating FLD  FEN:FLD VTE: SCD's,heparin IR:JJOAC 11/17>> Foley:none Follow up:TBD  DISPO:PO abx, 14 days total of abx and full liquids at time of discharge. Follow up with [name]. I spoke to wife   LOS: 5 days    Subjective: CC: loose stools and "gas pains"  No severe abdominal pain. He states pain is the same. He gets rumbling in his stomach and he gets quick pains that dissipate quickly. Having flatus. Tolerating diet. No fever or chills.   Objective: Vital signs in last 24 hours: Temp:  [98.2 F (36.8 C)-98.7 F (37.1 C)] 98.2 F (36.8 C) (11/22 0432) Pulse Rate:  [94-97] 96 (11/22 0432) Resp:  [18] 18 (11/22 0432) BP: (126-136)/(77-90) 136/90 (11/22 0432) SpO2:  [93 %-95 %] 94 % (11/22  0432) Last BM Date: 08/03/18  Intake/Output from previous day: 11/21 0701 - 11/22 0700 In: 1131.4 [P.O.:600; I.V.:481.4; IV Piggyback:50] Out: 1660 [Urine:1450] Intake/Output this shift: Total I/O In: -  Out: 150 [Urine:150]  PE: Gen: Alert, NAD, pleasant, cooperative Pulm:Rate andeffort normal Abd: Soft,obese, mild distention, NT,+BS Skin: no rashes noted, warm and dry  Anti-infectives: Anti-infectives (From admission, onward)   Start     Dose/Rate Route Frequency Ordered Stop   08/03/18 1030  amoxicillin-clavulanate (AUGMENTIN) 875-125 MG per tablet 1 tablet     1 tablet Oral Every 12 hours 08/03/18 1019     07/31/18 0800  piperacillin-tazobactam (ZOSYN) IVPB 3.375 g  Status:  Discontinued     3.375 g 12.5 mL/hr over 240 Minutes Intravenous Every 8 hours 07/31/18 0231 08/03/18 1019   07/30/18 2200  piperacillin-tazobactam (ZOSYN) IVPB 3.375 g     3.375 g 100 mL/hr over 30 Minutes Intravenous  Once 07/30/18 2149 07/31/18 0200      Lab Results:  Recent Labs    08/03/18 0426 08/04/18 0423  WBC 5.3 5.7  HGB 8.7* 8.9*  HCT 28.8* 29.5*  PLT 278 226   BMET Recent Labs    08/03/18 0426 08/04/18 0423  NA 136 136  136  K 3.1* 3.5  3.5  CL 105 104  104  CO2 22 25  25   GLUCOSE 101* 111*  110*  BUN <5* <5*  <5*  CREATININE 0.52* 0.40*  0.41*  CALCIUM 7.9* 8.2*  8.3*   PT/INR No results for input(s): LABPROT, INR in the last 72 hours. CMP     Component Value Date/Time   NA  136 08/04/2018 0423   NA 136 08/04/2018 0423   K 3.5 08/04/2018 0423   K 3.5 08/04/2018 0423   CL 104 08/04/2018 0423   CL 104 08/04/2018 0423   CO2 25 08/04/2018 0423   CO2 25 08/04/2018 0423   GLUCOSE 110 (H) 08/04/2018 0423   GLUCOSE 111 (H) 08/04/2018 0423   BUN <5 (L) 08/04/2018 0423   BUN <5 (L) 08/04/2018 0423   CREATININE 0.41 (L) 08/04/2018 0423   CREATININE 0.40 (L) 08/04/2018 0423   CREATININE 0.89 07/17/2018 1150   CREATININE 1.26 12/01/2012 1700   CALCIUM  8.3 (L) 08/04/2018 0423   CALCIUM 8.2 (L) 08/04/2018 0423   PROT 5.4 (L) 07/31/2018 0434   ALBUMIN 2.7 (L) 08/04/2018 0423   AST 12 (L) 07/31/2018 0434   AST 13 (L) 07/17/2018 1150   ALT 11 07/31/2018 0434   ALT 15 07/17/2018 1150   ALKPHOS 48 07/31/2018 0434   BILITOT 0.6 07/31/2018 0434   BILITOT 0.3 07/17/2018 1150   GFRNONAA >60 08/04/2018 0423   GFRNONAA >60 08/04/2018 0423   GFRNONAA >60 07/17/2018 1150   GFRAA >60 08/04/2018 0423   GFRAA >60 08/04/2018 0423   GFRAA >60 07/17/2018 1150   Lipase     Component Value Date/Time   LIPASE 18 07/30/2018 1531    Studies/Results: No results found.    Kalman Drape , [facility] Surgery 08/04/2018, 11:59 AM  Pager: [phone] Mon-Wed, Friday 7:00am-4:30pm Thurs 7am-11:30am  Consults: [phone]

## 2018-08-04 NOTE — Telephone Encounter (Signed)
Patient's wife left VM: Stated patient was d/c'd today from hospital and was told to stay on liquid diet until he followed up with you. Wanted to know when that would be as he had no appointments scheduled. Contacted patient's wife and left VM: Once appointment with [name] is set up, they can contact gastroenterologist with date to determine if liquid diet needed until appt. Will contact patient/spouse on Monday to clarify when next appointment will be.

## 2018-08-07 ENCOUNTER — Telehealth: Payer: Self-pay | Admitting: Hematology

## 2018-08-07 ENCOUNTER — Other Ambulatory Visit: Payer: Self-pay | Admitting: *Deleted

## 2018-08-07 ENCOUNTER — Telehealth: Payer: Self-pay | Admitting: *Deleted

## 2018-08-07 DIAGNOSIS — C8378 Burkitt lymphoma, lymph nodes of multiple sites: Secondary | ICD-10-CM

## 2018-08-07 NOTE — Telephone Encounter (Signed)
Spoke with patient's wife, diarrhea continues. Patient still on full liquid diet and supplemental magnesium. Per [name]: Add patient on to Friday 11/29 with lab appt. Advised wife that scheduling will contact her with time for appts. Spouse verbalized understanding.

## 2018-08-07 NOTE — Telephone Encounter (Signed)
Scheduled appt per 11/25 sch message - wife is aware of appt date and time.

## 2018-08-11 ENCOUNTER — Ambulatory Visit (HOSPITAL_COMMUNITY)
Admission: RE | Admit: 2018-08-11 | Discharge: 2018-08-11 | Disposition: A | Discharge status: Home / Self Care | Payer: BLUE CROSS/BLUE SHIELD | Source: Ambulatory Visit | Attending: Hematology | Admitting: Hematology

## 2018-08-11 ENCOUNTER — Inpatient Hospital Stay (HOSPITAL_BASED_OUTPATIENT_CLINIC_OR_DEPARTMENT_OTHER): Payer: BLUE CROSS/BLUE SHIELD | Admitting: Hematology

## 2018-08-11 ENCOUNTER — Telehealth: Payer: Self-pay

## 2018-08-11 ENCOUNTER — Inpatient Hospital Stay: Payer: BLUE CROSS/BLUE SHIELD

## 2018-08-11 VITALS — BP 131/83 | HR 124 | Temp 98.2°F | Resp 16 | Ht 69.0 in | Wt 197.3 lb

## 2018-08-11 DIAGNOSIS — E1165 Type 2 diabetes mellitus with hyperglycemia: Secondary | ICD-10-CM | POA: Diagnosis not present

## 2018-08-11 DIAGNOSIS — R530 Neoplastic (malignant) related fatigue: Secondary | ICD-10-CM

## 2018-08-11 DIAGNOSIS — C8378 Burkitt lymphoma, lymph nodes of multiple sites: Secondary | ICD-10-CM | POA: Insufficient documentation

## 2018-08-11 DIAGNOSIS — Z86711 Personal history of pulmonary embolism: Secondary | ICD-10-CM | POA: Insufficient documentation

## 2018-08-11 DIAGNOSIS — K56600 Partial intestinal obstruction, unspecified as to cause: Secondary | ICD-10-CM | POA: Diagnosis not present

## 2018-08-11 DIAGNOSIS — R197 Diarrhea, unspecified: Secondary | ICD-10-CM

## 2018-08-11 DIAGNOSIS — Z86718 Personal history of other venous thrombosis and embolism: Secondary | ICD-10-CM

## 2018-08-11 DIAGNOSIS — K5669 Other partial intestinal obstruction: Secondary | ICD-10-CM

## 2018-08-11 DIAGNOSIS — T380X5A Adverse effect of glucocorticoids and synthetic analogues, initial encounter: Secondary | ICD-10-CM | POA: Insufficient documentation

## 2018-08-11 DIAGNOSIS — Z7901 Long term (current) use of anticoagulants: Secondary | ICD-10-CM

## 2018-08-11 DIAGNOSIS — K56609 Unspecified intestinal obstruction, unspecified as to partial versus complete obstruction: Secondary | ICD-10-CM | POA: Diagnosis not present

## 2018-08-11 LAB — CBC WITH DIFFERENTIAL (CANCER CENTER ONLY)
ABS IMMATURE GRANULOCYTES: 0.04 10*3/uL (ref 0.00–0.07)
BASOS ABS: 0 10*3/uL (ref 0.0–0.1)
Basophils Relative: 0 %
EOS PCT: 1 %
Eosinophils Absolute: 0 10*3/uL (ref 0.0–0.5)
HCT: 30.8 % — ABNORMAL LOW (ref 39.0–52.0)
Hemoglobin: 9.7 g/dL — ABNORMAL LOW (ref 13.0–17.0)
Immature Granulocytes: 1 %
LYMPHS ABS: 4.6 10*3/uL — AB (ref 0.7–4.0)
LYMPHS PCT: 67 %
MCH: 29.8 pg (ref 26.0–34.0)
MCHC: 31.5 g/dL (ref 30.0–36.0)
MCV: 94.8 fL (ref 80.0–100.0)
Monocytes Absolute: 1.3 10*3/uL — ABNORMAL HIGH (ref 0.1–1.0)
Monocytes Relative: 20 %
NRBC: 0 % (ref 0.0–0.2)
Neutro Abs: 0.7 10*3/uL — ABNORMAL LOW (ref 1.7–7.7)
Neutrophils Relative %: 11 %
PLATELETS: 205 10*3/uL (ref 150–400)
RBC: 3.25 MIL/uL — ABNORMAL LOW (ref 4.22–5.81)
RDW: 18.4 % — ABNORMAL HIGH (ref 11.5–15.5)
WBC Count: 6.9 10*3/uL (ref 4.0–10.5)

## 2018-08-11 LAB — CMP (CANCER CENTER ONLY)
ALBUMIN: 2.5 g/dL — AB (ref 3.5–5.0)
ALT: 16 U/L (ref 0–44)
AST: 20 U/L (ref 15–41)
Alkaline Phosphatase: 53 U/L (ref 38–126)
Anion gap: 11 (ref 5–15)
BUN: 7 mg/dL — ABNORMAL LOW (ref 8–23)
CHLORIDE: 100 mmol/L (ref 98–111)
CO2: 23 mmol/L (ref 22–32)
CREATININE: 0.6 mg/dL — AB (ref 0.61–1.24)
Calcium: 8.9 mg/dL (ref 8.9–10.3)
GFR, Estimated: >60 mL/min (ref >60)
Glucose, Bld: 127 mg/dL — ABNORMAL HIGH (ref 70–99)
Potassium: 3.5 mmol/L (ref 3.5–5.1)
SODIUM: 134 mmol/L — AB (ref 135–145)
Total Bilirubin: 0.4 mg/dL (ref 0.3–1.2)
Total Protein: 5.8 g/dL — ABNORMAL LOW (ref 6.5–8.1)

## 2018-08-11 LAB — MAGNESIUM: MAGNESIUM: 1.7 mg/dL (ref 1.7–2.4)

## 2018-08-11 NOTE — Telephone Encounter (Signed)
Per 11/29 os Refer. sent

## 2018-08-11 NOTE — Telephone Encounter (Signed)
Printed avs and calender of upcoming appointment. Per 11/29 los

## 2018-08-14 ENCOUNTER — Telehealth: Payer: Self-pay | Admitting: *Deleted

## 2018-08-14 ENCOUNTER — Inpatient Hospital Stay: Payer: BLUE CROSS/BLUE SHIELD | Attending: Hematology | Admitting: Nutrition

## 2018-08-14 NOTE — Progress Notes (Signed)
Nutrition follow-up completed with patient and his wife.  Patient has been diagnosed with lymphoma and has had a recent small bowel obstruction. Weight has decreased and was documented as 197 pounds today down from 233.5 pounds on August 16.  This is a 16% weight loss in less than 3 months. Patient has been following a full liquid diet with very slow advancement secondary to bowel obstruction. He reports he took one Imodium on Saturday for diarrhea which then improved.  He had one small stool this morning which was soft and formed. Patient has been adding in very soft foods such as yogurt, macaroni and cheese, cream soup, and pumpkin pie.  Nutrition diagnosis: Food and nutrition related knowledge deficit continues.  Severe malnutrition in the context of chronic illness secondary to greater than 7-1/2% weight loss over 3 months and less than 75% energy intake for greater than 1 month.  Patient noted to have moderate to severe depletion of body fat and muscle mass.  Estimated nutrition needs: 2400-2600 cal, 115-130 g protein, 2.4 L fluid.  Intervention: Patient was educated on diet advancement including other foods on full liquid diet as well as slow advancement of low fiber foods. He was provided with fact sheets. Educated him on adding oral nutrition supplements and provided samples. Questions were answered.  Teach back method used.  Contact information given.  Monitoring, evaluation, goals: Patient will tolerate increased calories and protein to minimize further weight loss and minimize development of further bowel obstructions.  Next visit: To be scheduled as needed.  **Disclaimer: This note was dictated with voice recognition software. Similar sounding words can inadvertently be transcribed and this note may contain transcription errors which may not have been corrected upon publication of note.**

## 2018-08-14 NOTE — Telephone Encounter (Signed)
Spouse called. Patient's diarrhea has stopped after taking one Imodium on Saturday as directed by [name]. First BM after imodium was small formed BM this AM. Spouse states patient feels better and has more appetite. Spouse asked if he should take a laxative. Per [name], no laxatives at this time. Advised patient to increase fluids as tolerated and let office know tomorrow if no further stools. Patient and spouse verbalized understanding.

## 2018-08-15 ENCOUNTER — Telehealth: Payer: Self-pay | Admitting: *Deleted

## 2018-08-15 NOTE — Telephone Encounter (Signed)
Wife Mark Frey) called, patient has not had BM today. Last was small formed BM morning of 12/2. Passing small amount flatus. Asking for advice. Per [name]: increase fluids, especially warm ones and patient should not remain sedentary, ambulate as much as comfortable. Patient/wife to call office to update tomorrow.

## 2018-08-16 ENCOUNTER — Other Ambulatory Visit: Payer: Self-pay | Admitting: Family Medicine

## 2018-08-16 ENCOUNTER — Telehealth: Payer: Self-pay | Admitting: *Deleted

## 2018-08-16 ENCOUNTER — Encounter: Payer: Self-pay | Admitting: Gastroenterology

## 2018-08-16 NOTE — Telephone Encounter (Addendum)
Spouse called to report that this am small amy diarrhea with gas. Asked if patient should consider laxative. Per [name]: Laxative not indicated at this point. Continue increase fluid, movement and food intake as tolerated. Called patient/wife and gave information as directed by MD.  Their concern is that these current symptoms look and feel like the past 2 bowel blockages and patient does not want to have it happen again. Asked that MD be given that information and then contact them with MD's advice.

## 2018-08-16 NOTE — Telephone Encounter (Signed)
Per [name], contacted patient with following directions: If patient/spouse continue to have concerns related bowel movements/function, contact Dr. Ardis Hughs (GI) for assessment. Contacted patient/spouse and gave direction. They verbalized understanding. Per spouse, she had contacted Dr. Eugenia Pancoast office this afternoon. She spoke with scheduler and was given appt of 09/22/17 for patient. She was concerned that the appointment was too far away.  Advised her to contact office 12/5 AM and speak with Dr. Eugenia Pancoast nurse to explain concerns about patient to see if possible to obtain earlier appointment for patient. Ms. Casella also stated they may follow up with [facility] as well.

## 2018-08-17 ENCOUNTER — Emergency Department (HOSPITAL_COMMUNITY): Payer: BLUE CROSS/BLUE SHIELD

## 2018-08-17 ENCOUNTER — Telehealth: Payer: Self-pay | Admitting: Gastroenterology

## 2018-08-17 ENCOUNTER — Other Ambulatory Visit: Payer: Self-pay

## 2018-08-17 ENCOUNTER — Ambulatory Visit (INDEPENDENT_AMBULATORY_CARE_PROVIDER_SITE_OTHER): Payer: BLUE CROSS/BLUE SHIELD

## 2018-08-17 ENCOUNTER — Ambulatory Visit: Payer: Self-pay | Admitting: *Deleted

## 2018-08-17 ENCOUNTER — Inpatient Hospital Stay (HOSPITAL_COMMUNITY)
Admission: EM | Admit: 2018-08-17 | Discharge: 2018-08-28 | DRG: 329 | Disposition: A | Discharge status: Home Health | Payer: BLUE CROSS/BLUE SHIELD | Attending: Internal Medicine | Admitting: Internal Medicine

## 2018-08-17 ENCOUNTER — Ambulatory Visit (INDEPENDENT_AMBULATORY_CARE_PROVIDER_SITE_OTHER): Payer: BLUE CROSS/BLUE SHIELD | Admitting: Family Medicine

## 2018-08-17 ENCOUNTER — Encounter: Payer: Self-pay | Admitting: Family Medicine

## 2018-08-17 ENCOUNTER — Encounter (HOSPITAL_COMMUNITY): Payer: Self-pay | Admitting: Emergency Medicine

## 2018-08-17 VITALS — BP 110/80 | HR 123 | Temp 98.0°F | Ht 69.0 in | Wt 199.6 lb

## 2018-08-17 DIAGNOSIS — K5909 Other constipation: Secondary | ICD-10-CM

## 2018-08-17 DIAGNOSIS — E119 Type 2 diabetes mellitus without complications: Secondary | ICD-10-CM

## 2018-08-17 DIAGNOSIS — T380X5A Adverse effect of glucocorticoids and synthetic analogues, initial encounter: Secondary | ICD-10-CM | POA: Diagnosis not present

## 2018-08-17 DIAGNOSIS — E785 Hyperlipidemia, unspecified: Secondary | ICD-10-CM | POA: Diagnosis present

## 2018-08-17 DIAGNOSIS — N202 Calculus of kidney with calculus of ureter: Secondary | ICD-10-CM | POA: Diagnosis not present

## 2018-08-17 DIAGNOSIS — K5669 Other partial intestinal obstruction: Secondary | ICD-10-CM | POA: Diagnosis not present

## 2018-08-17 DIAGNOSIS — Z6827 Body mass index (BMI) 27.0-27.9, adult: Secondary | ICD-10-CM | POA: Diagnosis not present

## 2018-08-17 DIAGNOSIS — Z79899 Other long term (current) drug therapy: Secondary | ICD-10-CM | POA: Diagnosis not present

## 2018-08-17 DIAGNOSIS — Z888 Allergy status to other drugs, medicaments and biological substances status: Secondary | ICD-10-CM

## 2018-08-17 DIAGNOSIS — E11649 Type 2 diabetes mellitus with hypoglycemia without coma: Secondary | ICD-10-CM | POA: Diagnosis not present

## 2018-08-17 DIAGNOSIS — Z4682 Encounter for fitting and adjustment of non-vascular catheter: Secondary | ICD-10-CM | POA: Diagnosis not present

## 2018-08-17 DIAGNOSIS — Z9221 Personal history of antineoplastic chemotherapy: Secondary | ICD-10-CM | POA: Diagnosis not present

## 2018-08-17 DIAGNOSIS — K56609 Unspecified intestinal obstruction, unspecified as to partial versus complete obstruction: Secondary | ICD-10-CM

## 2018-08-17 DIAGNOSIS — E1165 Type 2 diabetes mellitus with hyperglycemia: Secondary | ICD-10-CM | POA: Diagnosis not present

## 2018-08-17 DIAGNOSIS — K2970 Gastritis, unspecified, without bleeding: Secondary | ICD-10-CM | POA: Diagnosis present

## 2018-08-17 DIAGNOSIS — C8510 Unspecified B-cell lymphoma, unspecified site: Secondary | ICD-10-CM | POA: Diagnosis present

## 2018-08-17 DIAGNOSIS — D649 Anemia, unspecified: Secondary | ICD-10-CM | POA: Diagnosis not present

## 2018-08-17 DIAGNOSIS — K56691 Other complete intestinal obstruction: Secondary | ICD-10-CM

## 2018-08-17 DIAGNOSIS — Z86718 Personal history of other venous thrombosis and embolism: Secondary | ICD-10-CM

## 2018-08-17 DIAGNOSIS — R935 Abnormal findings on diagnostic imaging of other abdominal regions, including retroperitoneum: Secondary | ICD-10-CM

## 2018-08-17 DIAGNOSIS — E876 Hypokalemia: Secondary | ICD-10-CM | POA: Diagnosis not present

## 2018-08-17 DIAGNOSIS — Z86711 Personal history of pulmonary embolism: Secondary | ICD-10-CM | POA: Diagnosis not present

## 2018-08-17 DIAGNOSIS — K5651 Intestinal adhesions [bands], with partial obstruction: Principal | ICD-10-CM | POA: Diagnosis present

## 2018-08-17 DIAGNOSIS — R5383 Other fatigue: Secondary | ICD-10-CM | POA: Diagnosis not present

## 2018-08-17 DIAGNOSIS — Z7901 Long term (current) use of anticoagulants: Secondary | ICD-10-CM | POA: Diagnosis not present

## 2018-08-17 DIAGNOSIS — E43 Unspecified severe protein-calorie malnutrition: Secondary | ICD-10-CM | POA: Diagnosis present

## 2018-08-17 DIAGNOSIS — I10 Essential (primary) hypertension: Secondary | ICD-10-CM | POA: Diagnosis present

## 2018-08-17 DIAGNOSIS — Z01818 Encounter for other preprocedural examination: Secondary | ICD-10-CM

## 2018-08-17 DIAGNOSIS — I82411 Acute embolism and thrombosis of right femoral vein: Secondary | ICD-10-CM | POA: Diagnosis not present

## 2018-08-17 DIAGNOSIS — Z8572 Personal history of non-Hodgkin lymphomas: Secondary | ICD-10-CM | POA: Diagnosis present

## 2018-08-17 DIAGNOSIS — K56600 Partial intestinal obstruction, unspecified as to cause: Secondary | ICD-10-CM | POA: Diagnosis not present

## 2018-08-17 DIAGNOSIS — K56699 Other intestinal obstruction unspecified as to partial versus complete obstruction: Secondary | ICD-10-CM

## 2018-08-17 DIAGNOSIS — K6389 Other specified diseases of intestine: Secondary | ICD-10-CM | POA: Diagnosis not present

## 2018-08-17 LAB — COMPREHENSIVE METABOLIC PANEL
ALT: 13 U/L (ref 0–44)
ANION GAP: 10 (ref 5–15)
AST: 13 U/L — ABNORMAL LOW (ref 15–41)
Albumin: 2.7 g/dL — ABNORMAL LOW (ref 3.5–5.0)
Alkaline Phosphatase: 56 U/L (ref 38–126)
BUN: 13 mg/dL (ref 8–23)
CO2: 29 mmol/L (ref 22–32)
Calcium: 8.7 mg/dL — ABNORMAL LOW (ref 8.9–10.3)
Chloride: 97 mmol/L — ABNORMAL LOW (ref 98–111)
Creatinine, Ser: 0.64 mg/dL (ref 0.61–1.24)
GFR calc Af Amer: >60 mL/min (ref >60)
GFR calc non Af Amer: >60 mL/min (ref >60)
Glucose, Bld: 113 mg/dL — ABNORMAL HIGH (ref 70–99)
POTASSIUM: 4.3 mmol/L (ref 3.5–5.1)
Sodium: 136 mmol/L (ref 135–145)
Total Bilirubin: 0.5 mg/dL (ref 0.3–1.2)
Total Protein: 5.7 g/dL — ABNORMAL LOW (ref 6.5–8.1)

## 2018-08-17 LAB — URINALYSIS, ROUTINE W REFLEX MICROSCOPIC
Bilirubin Urine: NEGATIVE
Glucose, UA: NEGATIVE mg/dL
Hgb urine dipstick: NEGATIVE
Ketones, ur: NEGATIVE mg/dL
Leukocytes, UA: NEGATIVE
Nitrite: NEGATIVE
Protein, ur: NEGATIVE mg/dL
Specific Gravity, Urine: >1.046 — ABNORMAL HIGH (ref 1.005–1.030)
pH: 5 (ref 5.0–8.0)

## 2018-08-17 LAB — CBC WITH DIFFERENTIAL/PLATELET
ABS IMMATURE GRANULOCYTES: 0.03 10*3/uL (ref 0.00–0.07)
BASOS PCT: 0 %
Basophils Absolute: 0 10*3/uL (ref 0.0–0.1)
Eosinophils Absolute: 0 10*3/uL (ref 0.0–0.5)
Eosinophils Relative: 0 %
HCT: 31.7 % — ABNORMAL LOW (ref 39.0–52.0)
Hemoglobin: 9.6 g/dL — ABNORMAL LOW (ref 13.0–17.0)
Immature Granulocytes: 0 %
Lymphocytes Relative: 36 %
Lymphs Abs: 2.6 10*3/uL (ref 0.7–4.0)
MCH: 29.8 pg (ref 26.0–34.0)
MCHC: 30.3 g/dL (ref 30.0–36.0)
MCV: 98.4 fL (ref 80.0–100.0)
Monocytes Absolute: 0.5 10*3/uL (ref 0.1–1.0)
Monocytes Relative: 7 %
Neutro Abs: 4.1 10*3/uL (ref 1.7–7.7)
Neutrophils Relative %: 57 %
PLATELETS: 214 10*3/uL (ref 150–400)
RBC: 3.22 MIL/uL — ABNORMAL LOW (ref 4.22–5.81)
RDW: 18.6 % — ABNORMAL HIGH (ref 11.5–15.5)
WBC: 7.2 10*3/uL (ref 4.0–10.5)
nRBC: 0 % (ref 0.0–0.2)

## 2018-08-17 LAB — LIPASE, BLOOD: Lipase: 19 U/L (ref 11–51)

## 2018-08-17 MED ORDER — PIPERACILLIN-TAZOBACTAM 3.375 G IVPB 30 MIN
3.3750 g | Freq: Once | INTRAVENOUS | Status: AC
  Administered 2018-08-18: 3.375 g via INTRAVENOUS
  Filled 2018-08-17: qty 50

## 2018-08-17 MED ORDER — SODIUM CHLORIDE 0.9 % IV BOLUS
500.0000 mL | Freq: Once | INTRAVENOUS | Status: AC
  Administered 2018-08-17: 500 mL via INTRAVENOUS

## 2018-08-17 MED ORDER — IOHEXOL 300 MG/ML  SOLN
30.0000 mL | Freq: Once | INTRAMUSCULAR | Status: AC | PRN
  Administered 2018-08-17: 30 mL via ORAL

## 2018-08-17 MED ORDER — IOPAMIDOL (ISOVUE-300) INJECTION 61%
INTRAVENOUS | Status: AC
  Filled 2018-08-17: qty 100

## 2018-08-17 MED ORDER — IOPAMIDOL (ISOVUE-300) INJECTION 61%
100.0000 mL | Freq: Once | INTRAVENOUS | Status: AC | PRN
  Administered 2018-08-17: 100 mL via INTRAVENOUS

## 2018-08-17 NOTE — ED Triage Notes (Addendum)
Reports no bowel movement since last Saturday after taking immodium to stop diarrhea.  Seen by PCP today with abdominal xray.  Reports partial bowel obstruction.  Cancer patient-last chemo was end of October.

## 2018-08-17 NOTE — ED Provider Notes (Addendum)
Emergency Department Provider Note   I have reviewed the triage vital signs and the nursing notes.   HISTORY  Chief Complaint Constipation   HPI Mark Frey is a 62 y.o. male with PMH of lymphoma, HLD, and multiple recent SBO presentations and admissions all managed medically resents to the emergency department for evaluation of abdominal distention, constipation, and occasional nausea.  Patient is having some mild abdominal discomfort in the right and upper abdomen.  His abdomen has become more distended.  He did have a tiny amount of diarrhea this morning that was nonbloody but is no longer passing any stool or gas.  His last bowel movement was 5 days ago.  This feels similar to his prior episodes of small bowel obstruction.  Patient has no prior history of abdominal surgery.  His lymphoma did result in abdominal mass but that has resolved on repeat imaging.  He denies any fevers or chills.  Past Medical History:  Diagnosis Date  . ALLERGIC RHINITIS   . Cancer ([place])    Lymphoma   . Diabetes mellitus   . Hyperlipidemia     Patient Active Problem List   Diagnosis Date Noted  . Diverticulitis of colon   . Low magnesium level 07/30/2018  . Hypokalemia 07/21/2018  . SBO (small bowel obstruction) ([place]) 07/21/2018  . Diarrhea 07/21/2018  . 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 Diabetes mellitus ([place])   . Edema   . Deep vein thrombosis (DVT) of femoral vein of right lower extremity ([place])   . Counseling regarding advance care planning and goals of care 04/25/2018  . Gastrointestinal hemorrhage   . Encounter for antineoplastic chemotherapy   . 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 Port-A-Cath in place 03/27/2018  . Acute deep vein thrombosis (DVT) of femoral vein of right lower extremity ([place])   . 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 Bilateral pulmonary embolism ([place]) 03/10/2018  . Anemia 03/10/2018    . Hypoglycemia 03/10/2018  . Occult blood in stools 03/10/2018  . Abdominal mass 03/10/2018  . Tachycardia 01/31/2017  . Controlled diabetes mellitus type II without complication ([place]) 68/08/7516  . Hypertension 06/05/2014  . Irritable bowel syndrome 03/30/2010  . Morbid obesity ([place]) 12/29/2009  . Dysmetabolic syndrome X 00/17/4944  . BACK PAIN WITH RADICULOPATHY 06/09/2007  . Hyperlipidemia 05/12/2007  . ALLERGIC RHINITIS 05/12/2007    Past Surgical History:  Procedure Laterality Date  . BIOPSY  03/15/2018   Procedure: BIOPSY;  Surgeon: Milus Banister, MD;  Location: [facility] ENDOSCOPY;  Service: Endoscopy;;  . CLEFT PALATE REPAIR    . COLONOSCOPY N/A 03/15/2018   Procedure: COLONOSCOPY;  Surgeon: Milus Banister, MD;  Location: [facility] ENDOSCOPY;  Service: Endoscopy;  Laterality: N/A;  . deviated septum repair     slight improvement  . ESOPHAGOGASTRODUODENOSCOPY N/A 03/15/2018   Procedure: ESOPHAGOGASTRODUODENOSCOPY (EGD);  Surgeon: Milus Banister, MD;  Location: [name];  Service: Endoscopy;  Laterality: N/A;  . IR IMAGING GUIDED PORT INSERTION  03/17/2018  . TONSILLECTOMY     Allergies Ciprofloxacin  Family History  Problem Relation Age of Onset  . Lung cancer Mother        smoker  . Brain cancer Mother        metastasis  . AAA (abdominal aortic aneurysm) Father        smoker    Social History Social History   Tobacco Use  . Smoking status: Never  Smoker  . Smokeless tobacco: Never Used  Substance Use Topics  . Alcohol use: Yes    Comment: occasional  . Drug use: No    Review of Systems  Constitutional: No fever/chills Eyes: No visual changes. ENT: No sore throat. Cardiovascular: Denies chest pain. Respiratory: Denies shortness of breath. Gastrointestinal: Positive abdominal pain. Positive nausea, no vomiting.  No diarrhea. Positive constipation. Genitourinary: Negative for dysuria. Musculoskeletal: Negative for back pain. Skin: Negative for  rash. Neurological: Negative for headaches, focal weakness or numbness.  10-point ROS otherwise negative.  ____________________________________________   PHYSICAL EXAM:  VITAL SIGNS: ED Triage Vitals  Enc Vitals Group     BP 08/17/18 1554 (!) 126/98     Pulse Rate 08/17/18 1554 (!) 128     Resp 08/17/18 1554 20     Temp 08/17/18 1554 99 F (37.2 C)     Temp Source 08/17/18 1554 Oral     SpO2 08/17/18 1554 96 %     Weight 08/17/18 1555 199 lb (90.3 kg)     Height 08/17/18 1555 5' 9"  (1.753 m)     Pain Score 08/17/18 1555 6   Constitutional: Alert and oriented. Well appearing and in no acute distress. Eyes: Conjunctivae are normal. Head: Atraumatic. Nose: No congestion/rhinnorhea. Mouth/Throat: Mucous membranes are moist.  Oropharynx non-erythematous. Neck: No stridor.   Cardiovascular: Tachycardia. Good peripheral circulation. Grossly normal heart sounds.   Respiratory: Normal respiratory effort.  No retractions. Lungs CTAB. Gastrointestinal: Soft with diffuse, mild tenderness. Positive distention.  Musculoskeletal: No lower extremity tenderness nor edema. No gross deformities of extremities. Neurologic:  Normal speech and language. No gross focal neurologic deficits are appreciated.  Skin:  Skin is warm, dry and intact. No rash noted.  ____________________________________________   LABS (all labs ordered are listed, but only abnormal results are displayed)  Labs Reviewed  COMPREHENSIVE METABOLIC PANEL - Abnormal; Notable for the following components:      Result Value   Chloride 97 (*)    Glucose, Bld 113 (*)    Calcium 8.7 (*)    Total Protein 5.7 (*)    Albumin 2.7 (*)    AST 13 (*)    All other components within normal limits  CBC WITH DIFFERENTIAL/PLATELET - Abnormal; Notable for the following components:   RBC 3.22 (*)    Hemoglobin 9.6 (*)    HCT 31.7 (*)    RDW 18.6 (*)    All other components within normal limits  URINALYSIS, ROUTINE W REFLEX  MICROSCOPIC - Abnormal; Notable for the following components:   Color, Urine AMBER (*)    Specific Gravity, Urine >1.046 (*)    All other components within normal limits  LIPASE, BLOOD   ____________________________________________  RADIOLOGY  Dg Abd 1 View  Result Date: 08/17/2018 CLINICAL DATA:  62 year old presenting with abdominal distention and absence of bowel movements over the past 5 days. Patient was recently released from the hospital after having a partial small bowel obstruction. Personal history of lymphoma. EXAM: ABDOMEN - 1 VIEW COMPARISON:  08/11/2018, 08/01/2018 and earlier. Chest x-ray 07/30/2018. FINDINGS: AP ERECT image was obtained. Multiple markedly distended loops of small bowel throughout the upper abdomen demonstrating air-fluid levels. Distal small bowel of normal caliber. Normal caliber colon with moderate stool burden in the ascending colon. No evidence of free intraperitoneal air. Linear scarring in the visualized lung bases, unchanged. IMPRESSION: 1. Recurrent partial small bowel obstruction. 2. No free intraperitoneal air. These results will be called to the ordering clinician or  representative by the Radiologist Assistant, and communication documented in the PACS or zVision Dashboard. Electronically Signed   By: Evangeline Dakin M.D.   On: 08/17/2018 15:19   Ct Abdomen Pelvis W Contrast  Result Date: 08/17/2018 CLINICAL DATA:  62 year old male with concern for bowel obstruction. History of Burkitt's lymphoma and chemotherapy. EXAM: CT ABDOMEN AND PELVIS WITH CONTRAST TECHNIQUE: Multidetector CT imaging of the abdomen and pelvis was performed using the standard protocol following bolus administration of intravenous contrast. CONTRAST:  138m ISOVUE-300 IOPAMIDOL (ISOVUE-300) INJECTION 61%, 331mOMNIPAQUE IOHEXOL 300 MG/ML SOLN COMPARISON:  CT of the abdomen pelvis dated 07/30/2018 and abdominal radiograph dated 08/17/2018 FINDINGS: Lower chest: There are bibasilar  linear atelectasis/scarring. The tip of a central venous catheter is partially visualized at the cavoatrial junction. No intra-abdominal free air.  Trace fluid adjacent to the liver. Hepatobiliary: Subcentimeter hypodense focus in the dome of the liver is too small to characterize. The liver is otherwise unremarkable. No intrahepatic biliary ductal dilatation. There is a focal area of thickening at the gallbladder fundus most consistent with adenomyomatosis. This was present on the prior studies and was not metabolically active on the PET. No calcified gallstone. Pancreas: Unremarkable. No pancreatic ductal dilatation or surrounding inflammatory changes. Spleen: Normal in size without focal abnormality. Adrenals/Urinary Tract: The adrenal glands are unremarkable. There is a 5 mm nonobstructing calculus in the interpolar aspect of the right kidney. No hydronephrosis. Additional smaller nonobstructing stone noted in the superior pole of the right kidney. Subcentimeter bilateral renal hypodense lesions are not characterized but likely represent cysts. There is no hydronephrosis on either side. There is symmetric enhancement and excretion of contrast by both kidneys. The urinary bladder is grossly unremarkable. Stomach/Bowel: There is segmental thickening of the sigmoid colon involving approximately 8 cm length. There is associated luminal narrowing and stricture of the sigmoid colon with moderate amount of retained stool within the colon proximal to this segment with an appearance of a moderate to high-grade obstruction. The thickened segment of the sigmoid colon may be related to post treatment changes and resulting fibrosis although involvement with a neoplastic process is not excluded. This is however somewhat new since PET-CT of 06/09/2018. There is stranding of the fat in the pelvis adjacent to the sigmoid colon with tethering of the terminal ileum and appendix consistent with adhesions and scarring. There may be  a degree of low-grade or partial small-bowel obstruction at the focus of tethering of the terminal ileum. There is fecalization of the terminal ileum. However, fecal content passes through the colon. The small bowel are dilated measuring up to 6 cm. The appendix is thickened and dilated with fecal content. It measures up to 2.5 cm in diameter. Multiple appendicular with noted in the distal appendix. The changes of the appendix are likely secondary to post radiation treatment. However, acute appendicitis is not entirely excluded. Clinical correlation is recommended. Vascular/Lymphatic: The abdominal aorta and IVC are grossly unremarkable. No portal venous gas. No adenopathy. Reproductive: The prostate and seminal vesicles are grossly unremarkable. Other: None Musculoskeletal: Degenerative changes of the spine. No acute osseous pathology. IMPRESSION: 1. Segmental thickening of the sigmoid colon with associated luminal narrowing and stricture and resulting obstruction at the level of the sigmoid colon. Findings likely related to post treatment changes although a colonic mass is not entirely excluded. 2. Diffusely dilated small bowel may represent an ileus or a degree of small-bowel obstruction at the level of the terminal ileum secondary to adhesions. Stool however passes into the  colon. A small-bowel series may provide better evaluation of the degree of potential obstruction in the terminal ileum. 3. Dilated and thickened appendix, likely related to post treatment changes. Acute appendicitis is not entirely excluded. Clinical correlation is recommended. Electronically Signed   By: Anner Crete M.D.   On: 08/17/2018 21:43   Dg Abd Portable 1 View  Result Date: 08/17/2018 CLINICAL DATA:  62 year old male status post NG tube placement. EXAM: PORTABLE ABDOMEN - 1 VIEW COMPARISON:  CT of the abdomen pelvis dated 08/17/2018 FINDINGS: An enteric tube is partially visualized with side-port in the gastric area and tip  in the left upper abdomen likely in the proximal stomach. Dilated loops of small and large bowel noted in the visualized abdomen. IMPRESSION: Enteric tube with tip in the proximal stomach. Electronically Signed   By: Anner Crete M.D.   On: 08/17/2018 22:49    ____________________________________________   PROCEDURES  Procedure(s) performed:   Procedures  CRITICAL CARE Performed by: Margette Fast Total critical care time: 35 minutes Critical care time was exclusive of separately billable procedures and treating other patients. Critical care was necessary to treat or prevent imminent or life-threatening deterioration. Critical care was time spent personally by me on the following activities: development of treatment plan with patient and/or surrogate as well as nursing, discussions with consultants, evaluation of patient's response to treatment, examination of patient, obtaining history from patient or surrogate, ordering and performing treatments and interventions, ordering and review of laboratory studies, ordering and review of radiographic studies, pulse oximetry and re-evaluation of patient's condition.  Nanda Quinton, MD Emergency Medicine  ____________________________________________   INITIAL IMPRESSION / ASSESSMENT AND PLAN / ED COURSE  Pertinent labs & imaging results that were available during my care of the patient were reviewed by me and considered in my medical decision making (see chart for details).  Patient presents to the emergency department for evaluation of abdominal distention and symptoms similar to his recent small bowel obstructions.  He has had 2 recent admissions with similar presentation both manage medically with NG tube, n.p.o., IV fluids.  No surgery history.  Patient does have mild abdominal tenderness with no peritoneal findings.  Bowel sounds are high-pitched.  Plan for CT abdomen pelvis with p.o. and IV contrast.  CT imaging reviewed and discussed  with Surgery, Dr. Lucia Gaskins. Plan for Surgery consultation in the AM with likely offering surgery but will require discussion with patient's primary oncologist. NG tube already placed. Patient more comfortable. Plan for 24-48 hours of abs given inflammation but suspicion for new tumor causing SBO is elevated. Will admit to the hospitalist team.   Discussed patient's case with Hospitalist, Dr. Hal Hope to request admission. Patient and family (if present) updated with plan. Care transferred to Hospitalist service.  I reviewed all nursing notes, vitals, pertinent old records, EKGs, labs, imaging (as available).  ____________________________________________  FINAL CLINICAL IMPRESSION(S) / ED DIAGNOSES  Final diagnoses:  Other complete intestinal obstruction ([place])     MEDICATIONS GIVEN DURING THIS VISIT:  Medications  iopamidol (ISOVUE-300) 61 % injection (has no administration in time range)  piperacillin-tazobactam (ZOSYN) IVPB 3.375 g (has no administration in time range)  sodium chloride 0.9 % bolus 500 mL (500 mLs Intravenous New Bag/Given 08/17/18 1743)  iohexol (OMNIPAQUE) 300 MG/ML solution 30 mL (30 mLs Oral Contrast Given 08/17/18 1845)  iopamidol (ISOVUE-300) 61 % injection 100 mL (100 mLs Intravenous Contrast Given 08/17/18 2055)    Note:  This document was prepared using Dragon voice recognition  software and may include unintentional dictation errors.  Nanda Quinton, MD Emergency Medicine    Janera Peugh, [facility], MD 08/17/18 2352    Margette Fast, MD 08/27/18 2022

## 2018-08-17 NOTE — Telephone Encounter (Signed)
The pt has an appt with Dr Ardis Hughs in January, he has never been seen in the office but EGD was done in July 2019 and was diagnosed with lymphoma and PSBO in the past.  Has had a change in bowels.  Had diarrhea for a few weeks and now has constipation.  No BM since Saturday.  Appt moved up to 08/23/18 with Amy Esterwood.  The pt is also seeing med oncology and PCP prior to appt.

## 2018-08-17 NOTE — Telephone Encounter (Signed)
Pt's wife [name] called requesting a sooner appt with Dr. Ardis Hughs or advise for her husband. He has an appt in Jan 2020 with Dr. Ardis Hughs as a new pt but She states that Dr. Ardis Hughs saw pt at the hospital in July and diagnosed him with lymphoma, pt is not doing well and she is trying to avoid more ER visits. Pls call her.

## 2018-08-17 NOTE — Telephone Encounter (Signed)
See note

## 2018-08-17 NOTE — Telephone Encounter (Signed)
Noted.  Pt seen today 12/5 at 2:40pm.

## 2018-08-17 NOTE — Telephone Encounter (Signed)
Patient is a cancer patient and he experienced a partial blockage in his small bowel 4 weeks ago and it was cleared up, but another flare up 2 weeks ago. So now he hasn't had a bowel movement in 5 days. Needs advice. Patient's wife [name] would like for the call to be returned to her, but the patient is there with her.  [name] has contacted [facility] today- she is awaiting a call back from them ( they treated patient when he was in hospital)- Information collected and sent for Dr Yong Channel review. [name] will call back if [facility] feels patient should be seen by PCP and not them. [facility] has called back- they are recommending fluids( hoping this will improve) and watch for increased pain and vomiting- if patient gets worse- take to hospital.  Call to office- they are OK to see patient- aware Dr Yong Channel is not in office- OK to schedule with any provider. Patient scheduled.  Reason for Disposition . [1] Abdominal pain (i.e., mild or intermittent) AND [2] abdomen looks much more swollen than usual    Information collected for Dr Yong Channel to review- [name] will call back if [facility] feels they do not need to see patient.  Answer Assessment - Initial Assessment Questions 1. STOOL PATTERN OR FREQUENCY: "How often do you pass bowel movements (BMs)?"  (Normal range: tid to q 3 days)  "When was your last BM?"       Patient was having diarrhea for 2 weeks during hospitalization and the week after- instructed to use the Imodium 1 dose stopped the diarrhea. Patient was able to eat- small meals ( soft food diet/liquids)  2. STRAINING: "Do you have to strain to have a BM?"      N/a- this morning 1 tablespoon diarrhea 3. RECTAL PAIN: "Does your rectum hurt when the stool comes out?" If so, ask: "Do you have hemorrhoids? How bad is the pain?"  (Scale 1-10; or mild, moderate, severe)     no 4. STOOL COMPOSITION: "Are the stools hard?"      n/a 5. BLOOD ON STOOLS: "Has there been any blood on the toilet  tissue or on the surface of the BM?" If so, ask: "When was the last time?"      no 6. CHRONIC CONSTIPATION: "Is this a new problem for you?"  If no, ask: How long have you had this problem?" (days, weeks, months)      Patient has had partial blockage recently 7. CHANGES IN DIET: "Have there been any recent changes in your diet?"      Went from liquid to soft to liquid 8. MEDICATIONS: "Have you been taking any new medications?" "Are you taking any narcotic pain medications?" (e.g., Vicoden, Percocet, morphine, dilaudid)     No changes- only medication now- elequis and magnisium ( patient finished antibiotic Saturday) 9. LAXATIVES: "Have you been using any laxatives or enemas?"  If yes, ask "What, how often, and when was the last time?"     no 10. CAUSE: "What do you think is causing the constipation?"        inflammation was better- so diet was increased- not sure what is causing lack of BM now 11. CANCER: "What type of cancer do you have?"       lymphoma  12. CANCER - TREATMENT: "What cancer treatments have you received?" "When did you last receive them?" (e.g., recent surgery, radiation, or chemotherapy). If triager has access to the patient's medical record, triager should  review treatments and administration dates.       Cancer treatment has been completed 13. CANCER - NEUTROPENIA RISK: "Have you received chemotherapy recently? If Yes, "When was it and what was your last CBC and ANC (absolute neutrophil count)?" "Were you told that your white cell count was low?" If triager has access to the patient's medical record, triager should review most recent labs. An ANC less than 1,000 - 1,500 means that the neutrophils are low and the immune system is weak.       Last labs- last Friday- all labs reported normal 14. OTHER SYMPTOMS: "Do you have any other symptoms?" (e.g., abdominal pain, fever, vomiting)       Some gas pain- comes and goes- stomach is "noisey", same abdominal pain as previous -  nothing new  Protocols used: CANCER - CONSTIPATION-A-AH

## 2018-08-17 NOTE — Progress Notes (Signed)
Patient: Mark Frey MRN: [id number] DOB: 15-Sep-1955 PCP: [name], MD     Subjective:  Chief Complaint  Patient presents with  . Constipation    no bm in 5 days    HPI: The patient is a 62 y.o. male who presents today for constipation x 5 days. He has been hospitalized x2 for bowel obstruction. Saw oncologist last Friday and they did an xray. They increased his diet and started him on imodium. Diarrhea stopped with the imodium last Saturday. Wife called GI and they moved his appointment up to 08/23/18. She also called [facility] and they put him on a liquid diet. His last Bowel obstruction was in November (he had a high grade blockage in his small bowel). He was discharged on 08/04/2018. He has had no nausea or vomiting with this episode. He has abdominal pain that comes and goes. He describes it as gas pains, but has not passed gas. No fever/chills.   Review of Systems  Constitutional: Negative for chills and fever.  Respiratory: Negative for shortness of breath.   Cardiovascular: Negative for chest pain.  Gastrointestinal: Positive for abdominal pain and constipation. Negative for blood in stool, diarrhea, nausea and vomiting.    Allergies Patient is allergic to ciprofloxacin.  Past Medical History Patient  has a past medical history of ALLERGIC RHINITIS, Cancer ([place]), Diabetes mellitus, and Hyperlipidemia.  Surgical History Patient  has a past surgical history that includes Tonsillectomy; Cleft palate repair; deviated septum repair; IR IMAGING GUIDED PORT INSERTION (03/17/2018); Colonoscopy (N/A, 03/15/2018); Esophagogastroduodenoscopy (N/A, 03/15/2018); and biopsy (03/15/2018).  Family History Pateint's family history includes AAA (abdominal aortic aneurysm) in his father; Brain cancer in his mother; Lung cancer in his mother.  Social History Patient  reports that he has never smoked. He has never used smokeless tobacco. He reports that he drinks alcohol.  He reports that he does not use drugs.    Objective: Vitals:   08/17/18 1439  BP: 110/80  Pulse: (!) 123  Temp: 98 F (36.7 C)  TempSrc: Oral  SpO2: 96%  Weight: 199 lb 9.6 oz (90.5 kg)  Height: 5' 9"  (1.753 m)    Body mass index is 29.48 kg/m.  Physical Exam  Constitutional: He appears well-developed and well-nourished. No distress.  Cardiovascular: Regular rhythm and normal heart sounds.  Tachycardia   Pulmonary/Chest: Effort normal and breath sounds normal.  Abdominal: Soft. Bowel sounds are normal. He exhibits no distension. There is no tenderness.  Vitals reviewed.  KUB: partial small bowel obstruction.     Assessment/plan: 1. Partial small bowel obstruction (HCC) Partial obstruction again. Sending to ER. They voiced understanding and will go in their own personal vehicle.  - DG Abd 1 View; Future    Return if symptoms worsen or fail to improve.    Orma Flaming, MD [place]   08/17/2018

## 2018-08-17 NOTE — Telephone Encounter (Signed)
Left message on machine to call back  

## 2018-08-18 ENCOUNTER — Encounter (HOSPITAL_COMMUNITY): Payer: Self-pay | Admitting: Internal Medicine

## 2018-08-18 DIAGNOSIS — Z7901 Long term (current) use of anticoagulants: Secondary | ICD-10-CM

## 2018-08-18 DIAGNOSIS — I82411 Acute embolism and thrombosis of right femoral vein: Secondary | ICD-10-CM

## 2018-08-18 DIAGNOSIS — K56699 Other intestinal obstruction unspecified as to partial versus complete obstruction: Secondary | ICD-10-CM

## 2018-08-18 DIAGNOSIS — C8510 Unspecified B-cell lymphoma, unspecified site: Secondary | ICD-10-CM

## 2018-08-18 DIAGNOSIS — K56609 Unspecified intestinal obstruction, unspecified as to partial versus complete obstruction: Secondary | ICD-10-CM

## 2018-08-18 DIAGNOSIS — R5383 Other fatigue: Secondary | ICD-10-CM

## 2018-08-18 DIAGNOSIS — Z86711 Personal history of pulmonary embolism: Secondary | ICD-10-CM

## 2018-08-18 DIAGNOSIS — E1165 Type 2 diabetes mellitus with hyperglycemia: Secondary | ICD-10-CM

## 2018-08-18 LAB — COMPREHENSIVE METABOLIC PANEL
ALT: 10 U/L (ref 0–44)
ANION GAP: 9 (ref 5–15)
AST: 11 U/L — ABNORMAL LOW (ref 15–41)
Albumin: 2.7 g/dL — ABNORMAL LOW (ref 3.5–5.0)
Alkaline Phosphatase: 47 U/L (ref 38–126)
BUN: 12 mg/dL (ref 8–23)
CO2: 26 mmol/L (ref 22–32)
Calcium: 8.3 mg/dL — ABNORMAL LOW (ref 8.9–10.3)
Chloride: 101 mmol/L (ref 98–111)
Creatinine, Ser: 0.54 mg/dL — ABNORMAL LOW (ref 0.61–1.24)
GFR calc Af Amer: >60 mL/min (ref >60)
GFR calc non Af Amer: >60 mL/min (ref >60)
GLUCOSE: 102 mg/dL — AB (ref 70–99)
Potassium: 3.8 mmol/L (ref 3.5–5.1)
Sodium: 136 mmol/L (ref 135–145)
Total Bilirubin: 0.4 mg/dL (ref 0.3–1.2)
Total Protein: 5 g/dL — ABNORMAL LOW (ref 6.5–8.1)

## 2018-08-18 LAB — CBC WITH DIFFERENTIAL/PLATELET
Abs Immature Granulocytes: 0.02 10*3/uL (ref 0.00–0.07)
Basophils Absolute: 0 10*3/uL (ref 0.0–0.1)
Basophils Relative: 0 %
Eosinophils Absolute: 0 10*3/uL (ref 0.0–0.5)
Eosinophils Relative: 0 %
HCT: 29.1 % — ABNORMAL LOW (ref 39.0–52.0)
Hemoglobin: 8.6 g/dL — ABNORMAL LOW (ref 13.0–17.0)
Immature Granulocytes: 0 %
Lymphocytes Relative: 37 %
Lymphs Abs: 1.9 10*3/uL (ref 0.7–4.0)
MCH: 29.6 pg (ref 26.0–34.0)
MCHC: 29.6 g/dL — ABNORMAL LOW (ref 30.0–36.0)
MCV: 100 fL (ref 80.0–100.0)
Monocytes Absolute: 0.4 10*3/uL (ref 0.1–1.0)
Monocytes Relative: 8 %
Neutro Abs: 2.8 10*3/uL (ref 1.7–7.7)
Neutrophils Relative %: 55 %
Platelets: 203 10*3/uL (ref 150–400)
RBC: 2.91 MIL/uL — ABNORMAL LOW (ref 4.22–5.81)
RDW: 18.6 % — ABNORMAL HIGH (ref 11.5–15.5)
WBC: 5.1 10*3/uL (ref 4.0–10.5)
nRBC: 0 % (ref 0.0–0.2)

## 2018-08-18 LAB — APTT
aPTT: 45 seconds — ABNORMAL HIGH (ref 24–36)
aPTT: 32 seconds (ref 24–36)

## 2018-08-18 LAB — HEPARIN LEVEL (UNFRACTIONATED)
Heparin Unfractionated: 1.09 IU/mL — ABNORMAL HIGH (ref 0.30–0.70)
Heparin Unfractionated: 0.99 IU/mL — ABNORMAL HIGH (ref 0.30–0.70)

## 2018-08-18 LAB — GLUCOSE, CAPILLARY: Glucose-Capillary: 86 mg/dL (ref 70–99)

## 2018-08-18 LAB — CBG MONITORING, ED
GLUCOSE-CAPILLARY: 110 mg/dL — AB (ref 70–99)
Glucose-Capillary: 94 mg/dL (ref 70–99)
Glucose-Capillary: 98 mg/dL (ref 70–99)

## 2018-08-18 MED ORDER — HEPARIN (PORCINE) 25000 UT/250ML-% IV SOLN
1000.0000 [IU]/h | INTRAVENOUS | Status: DC
  Administered 2018-08-18: 1000 [IU]/h via INTRAVENOUS
  Filled 2018-08-18: qty 250

## 2018-08-18 MED ORDER — HEPARIN (PORCINE) 25000 UT/250ML-% IV SOLN
1500.0000 [IU]/h | INTRAVENOUS | Status: DC
  Administered 2018-08-19: 1500 [IU]/h via INTRAVENOUS
  Filled 2018-08-18 (×2): qty 250

## 2018-08-18 MED ORDER — INSULIN ASPART 100 UNIT/ML ~~LOC~~ SOLN
0.0000 [IU] | SUBCUTANEOUS | Status: DC
  Administered 2018-08-22: 1 [IU] via SUBCUTANEOUS

## 2018-08-18 MED ORDER — HEPARIN (PORCINE) 25000 UT/250ML-% IV SOLN: 1000.0000 [IU]/h | INTRAVENOUS | Status: DC

## 2018-08-18 MED ORDER — MORPHINE SULFATE (PF) 2 MG/ML IV SOLN: 1.0000 mg | INTRAVENOUS | Status: DC | PRN

## 2018-08-18 MED ORDER — ONDANSETRON HCL 4 MG PO TABS: 4.0000 mg | ORAL_TABLET | Freq: Four times a day (QID) | ORAL | Status: DC | PRN

## 2018-08-18 MED ORDER — ONDANSETRON HCL 4 MG/2ML IJ SOLN
4.0000 mg | Freq: Four times a day (QID) | INTRAMUSCULAR | Status: DC | PRN
  Filled 2018-08-18: qty 2

## 2018-08-18 MED ORDER — PIPERACILLIN-TAZOBACTAM 3.375 G IVPB
3.3750 g | Freq: Three times a day (TID) | INTRAVENOUS | Status: DC
  Administered 2018-08-18 – 2018-08-22 (×13): 3.375 g via INTRAVENOUS
  Filled 2018-08-18 (×14): qty 50

## 2018-08-18 MED ORDER — HEPARIN (PORCINE) 25000 UT/250ML-% IV SOLN
1200.0000 [IU]/h | INTRAVENOUS | Status: DC
  Filled 2018-08-18: qty 250

## 2018-08-18 MED ORDER — SODIUM CHLORIDE 0.9 % IV SOLN
INTRAVENOUS | Status: DC
  Administered 2018-08-18 (×2): via INTRAVENOUS

## 2018-08-18 MED ORDER — ACETAMINOPHEN 650 MG RE SUPP: 650.0000 mg | Freq: Four times a day (QID) | RECTAL | Status: DC | PRN

## 2018-08-18 MED ORDER — HEPARIN BOLUS VIA INFUSION
2500.0000 [IU] | Freq: Once | INTRAVENOUS | Status: AC
  Administered 2018-08-18: 2500 [IU] via INTRAVENOUS
  Filled 2018-08-18: qty 2500

## 2018-08-18 MED ORDER — ACETAMINOPHEN 325 MG PO TABS: 650.0000 mg | ORAL_TABLET | Freq: Four times a day (QID) | ORAL | Status: DC | PRN

## 2018-08-18 NOTE — Progress Notes (Addendum)
Pt tolerated tap water enema well. No bowel movement. Will continue to monitor.

## 2018-08-18 NOTE — Consult Note (Addendum)
Reason for Consult: Colon obstruction Referring Physician: Dr. Horris Latino Chief complaint: Constipation  Mark Frey is an 62 y.o. male.   HPI: Patient is a 62 year old male with multiple admissions this year.  He has a history of high-grade B-cell lymphoma.  He was hospitalized 6/28-7/31/19, with bilateral PEs and DVT.  Work-up at that time also revealed a large infiltrating right-sided intra-abdominal mass encasing the small bowel, part of the sigmoid and extending to other areas including right retroperitoneum and right psoas.  He also had a GI bleed at that time with hemoglobin dropping from 14 to 7.7, prior to initiation of Xarelto.  He was seen by oncology and started on chemotherapy, EPOCH for chromosomal variant Burkitts lymphoma.  He was readmitted, for chemotherapy by oncology on multiple occasions during this year.    On 07/21/2018 he was transferred from the cancer center to the emergency department with anorexia and constipation.  Work-up showed small bowel obstruction with transition point in the pelvis about 6 cm from the ileocecal valve.  He was admitted that time placed on small bowel protocol.  Small bowel obstruction resolved with conservative management and he was ready for discharge on 07/24/2018. He was seen in the ED again on 07/30/2018, with recurrent small bowel obstruction.  On this admission he had some thickening of the colon suggestive of possible inflammatory process vs appendicitis with multiple appendicoliths, vs progressive lymphoma.  He was started on antibiotics at this point.  He had completed his lymphoma treatment at this point.  This was treated with IV and then oral antibiotics and his obstruction resolved and he was discharged home on 08/04/2018.  PET scan obtained on 07/28/2018.  This showed masslike thickening of the proximal sigmoid colon with pericolonic and from [place] and intense hypermetabolic activity.  Differential included new lymphoma within the proximal  sigmoid versus diverticulitis versus colitis related to immunotherapy.  Stranding in the pericolonic fluid that was suggestive of an inflammatory process such as diverticulitis.  There is also moderate activity in the right colon the site of the prior lymphoma treatment.  Last evening the patient returned to the ED reporting no bowel movement for 5 days.  He was seen by his primary care and reports showed a small bowel obstruction.  He was referred to the ED. Work-up in the ED as he is afebrile and somewhat tachycardic, blood pressure stable.  Sats are good on room air.  CMP and CBC are unremarkable.  CT scan showed segmental thickening of the sigmoid colon associated with luminal narrowing and stricture was on obstruction at the level of sigmoid colon, diffusely dilated small bowel stool however did pass into the colon.  Dilated thickened appendix is likely related to posttreatment changes, but again acute appendicitis was not entirely excluded.  Patient was readmitted by medicine and NG was placed and we are asked to see.  Additional issues include type 2 diabetes, BMI of 30.4, history of PE/DVT on anticoagulation, anemia secondary to prior GI losses.   Past Medical History:  Diagnosis Date  . ALLERGIC RHINITIS   . Cancer ([place])    Lymphoma   . Diabetes mellitus   . Hyperlipidemia     Past Surgical History:  Procedure Laterality Date  . BIOPSY  03/15/2018   Procedure: BIOPSY;  Surgeon: Milus Banister, MD;  Location: [facility] ENDOSCOPY;  Service: Endoscopy;;  . CLEFT PALATE REPAIR    . COLONOSCOPY N/A 03/15/2018   Procedure: COLONOSCOPY;  Surgeon: Milus Banister, MD;  Location: [name];  Service: Endoscopy;  Laterality: N/A;  . deviated septum repair     slight improvement  . ESOPHAGOGASTRODUODENOSCOPY N/A 03/15/2018   Procedure: ESOPHAGOGASTRODUODENOSCOPY (EGD);  Surgeon: Milus Banister, MD;  Location: [name];  Service: Endoscopy;  Laterality: N/A;  . IR IMAGING GUIDED PORT INSERTION   03/17/2018  . TONSILLECTOMY      Family History  Problem Relation Age of Onset  . Lung cancer Mother        smoker  . Brain cancer Mother        metastasis  . AAA (abdominal aortic aneurysm) Father        smoker    Social History:  reports that he has never smoked. He has never used smokeless tobacco. He reports that he drinks alcohol. He reports that he does not use drugs.  Allergies:  Allergies  Allergen Reactions  . Ciprofloxacin Other (See Comments)    Leg tingling    Prior to Admission medications   Medication Sig Start Date End Date Taking? Authorizing Provider  apixaban (ELIQUIS) 5 MG TABS tablet Take 1 tablet (5 mg total) by mouth 2 (two) times daily. 06/16/18  Yes [name], MD  JANUMET 50-1000 MG tablet TAKE 1 TABLET TWICE DAILY  WITH MEALS 11/29/17  Yes [name], MD  Magnesium 500 MG CAPS Take 1 capsule (500 mg total) by mouth 2 (two) times daily. 06/27/18  Yes [name], MD  Probiotic Product (PROBIOTIC-10) CAPS Take 1 capsule by mouth daily.   Yes [provider]  blood glucose meter kit and supplies Dispense based on patient and insurance preference. Use daily as directed. (E11.9). 03/31/18   [name], MD  glucose blood (FREESTYLE TEST STRIPS) test strip Use to check blood sugar daily 03/30/18   [name], MD  lidocaine-prilocaine (EMLA) cream Apply 1 application topically as needed. Patient taking differently: Apply 1 application topically daily as needed (access port).  03/27/18   Truitt Merle, MD   . sodium chloride 100 mL/hr at 08/18/18 0247  . heparin 1,000 Units/hr (08/18/18 0509)     Results for orders placed or performed during the hospital encounter of 08/17/18 (from the past 48 hour(s))  Urinalysis, Routine w reflex microscopic     Status: Abnormal   Collection Time: 08/17/18  4:24 PM  Result Value Ref Range   Color, Urine AMBER (A) YELLOW    Comment: BIOCHEMICALS MAY BE AFFECTED BY COLOR   APPearance  CLEAR CLEAR   Specific Gravity, Urine >1.046 (H) 1.005 - 1.030   pH 5.0 5.0 - 8.0   Glucose, UA NEGATIVE NEGATIVE mg/dL   Hgb urine dipstick NEGATIVE NEGATIVE   Bilirubin Urine NEGATIVE NEGATIVE   Ketones, ur NEGATIVE NEGATIVE mg/dL   Protein, ur NEGATIVE NEGATIVE mg/dL   Nitrite NEGATIVE NEGATIVE   Leukocytes, UA NEGATIVE NEGATIVE    Comment: Performed at [facility], [place] [address] [place], [place] [address]  Comprehensive metabolic panel     Status: Abnormal   Collection Time: 08/17/18  5:38 PM  Result Value Ref Range   Sodium 136 135 - 145 mmol/L   Potassium 4.3 3.5 - 5.1 mmol/L   Chloride 97 (L) 98 - 111 mmol/L   CO2 29 22 - 32 mmol/L   Glucose, Bld 113 (H) 70 - 99 mg/dL   BUN 13 8 - 23 mg/dL   Creatinine, Ser 0.64 0.61 - 1.24 mg/dL   Calcium 8.7 (L) 8.9 - 10.3 mg/dL  Total Protein 5.7 (L) 6.5 - 8.1 g/dL   Albumin 2.7 (L) 3.5 - 5.0 g/dL   AST 13 (L) 15 - 41 U/L   ALT 13 0 - 44 U/L   Alkaline Phosphatase 56 38 - 126 U/L   Total Bilirubin 0.5 0.3 - 1.2 mg/dL   GFR calc non Af Amer >60 >60 mL/min   GFR calc Af Amer >60 >60 mL/min   Anion gap 10 5 - 15    Comment: Performed at [facility], [place] [address], [place], [place] [address]  Lipase, blood     Status: None   Collection Time: 08/17/18  5:38 PM  Result Value Ref Range   Lipase 19 11 - 51 U/L    Comment: Performed at [facility], [place] [address], [place], [place] [address]  CBC with Differential     Status: Abnormal   Collection Time: 08/17/18  5:38 PM  Result Value Ref Range   WBC 7.2 4.0 - 10.5 K/uL   RBC 3.22 (L) 4.22 - 5.81 MIL/uL   Hemoglobin 9.6 (L) 13.0 - 17.0 g/dL   HCT 31.7 (L) 39.0 - 52.0 %   MCV 98.4 80.0 - 100.0 fL   MCH 29.8 26.0 - 34.0 pg   MCHC 30.3 30.0 - 36.0 g/dL   RDW 18.6 (H) 11.5 - 15.5 %   Platelets 214 150 - 400 K/uL   nRBC 0.0 0.0 - 0.2 %   Neutrophils Relative % 57 %   Neutro Abs 4.1 1.7 - 7.7 K/uL   Lymphocytes Relative 36 %    Lymphs Abs 2.6 0.7 - 4.0 K/uL   Monocytes Relative 7 %   Monocytes Absolute 0.5 0.1 - 1.0 K/uL   Eosinophils Relative 0 %   Eosinophils Absolute 0.0 0.0 - 0.5 K/uL   Basophils Relative 0 %   Basophils Absolute 0.0 0.0 - 0.1 K/uL   Immature Granulocytes 0 %   Abs Immature Granulocytes 0.03 0.00 - 0.07 K/uL    Comment: Performed at [facility], [place] [address] [place], [place] [address]  CBG monitoring, ED     Status: Abnormal   Collection Time: 08/18/18  2:28 AM  Result Value Ref Range   Glucose-Capillary 110 (H) 70 - 99 mg/dL  CBG monitoring, ED     Status: None   Collection Time: 08/18/18  4:16 AM  Result Value Ref Range   Glucose-Capillary 94 70 - 99 mg/dL   Comment 1 Notify RN   Comprehensive metabolic panel     Status: Abnormal   Collection Time: 08/18/18  5:10 AM  Result Value Ref Range   Sodium 136 135 - 145 mmol/L   Potassium 3.8 3.5 - 5.1 mmol/L   Chloride 101 98 - 111 mmol/L   CO2 26 22 - 32 mmol/L   Glucose, Bld 102 (H) 70 - 99 mg/dL   BUN 12 8 - 23 mg/dL   Creatinine, Ser 0.54 (L) 0.61 - 1.24 mg/dL   Calcium 8.3 (L) 8.9 - 10.3 mg/dL   Total Protein 5.0 (L) 6.5 - 8.1 g/dL   Albumin 2.7 (L) 3.5 - 5.0 g/dL   AST 11 (L) 15 - 41 U/L   ALT 10 0 - 44 U/L   Alkaline Phosphatase 47 38 - 126 U/L   Total Bilirubin 0.4 0.3 - 1.2 mg/dL   GFR calc non Af Amer >60 >60 mL/min   GFR calc Af Amer >60 >60 mL/min   Anion gap 9  5 - 15    Comment: Performed at [facility], [place] [address] [place], [place] [address]  CBC WITH DIFFERENTIAL     Status: Abnormal   Collection Time: 08/18/18  5:10 AM  Result Value Ref Range   WBC 5.1 4.0 - 10.5 K/uL   RBC 2.91 (L) 4.22 - 5.81 MIL/uL   Hemoglobin 8.6 (L) 13.0 - 17.0 g/dL   HCT 29.1 (L) 39.0 - 52.0 %   MCV 100.0 80.0 - 100.0 fL   MCH 29.6 26.0 - 34.0 pg   MCHC 29.6 (L) 30.0 - 36.0 g/dL   RDW 18.6 (H) 11.5 - 15.5 %   Platelets 203 150 - 400 K/uL   nRBC 0.0 0.0 - 0.2 %   Neutrophils Relative  % 55 %   Neutro Abs 2.8 1.7 - 7.7 K/uL   Lymphocytes Relative 37 %   Lymphs Abs 1.9 0.7 - 4.0 K/uL   Monocytes Relative 8 %   Monocytes Absolute 0.4 0.1 - 1.0 K/uL   Eosinophils Relative 0 %   Eosinophils Absolute 0.0 0.0 - 0.5 K/uL   Basophils Relative 0 %   Basophils Absolute 0.0 0.0 - 0.1 K/uL   Immature Granulocytes 0 %   Abs Immature Granulocytes 0.02 0.00 - 0.07 K/uL    Comment: Performed at [facility], [place] [address] [place], [place] [address]    Dg Abd 1 View  Result Date: 08/17/2018 CLINICAL DATA:  62 year old presenting with abdominal distention and absence of bowel movements over the past 5 days. Patient was recently released from the hospital after having a partial small bowel obstruction. Personal history of lymphoma. EXAM: ABDOMEN - 1 VIEW COMPARISON:  08/11/2018, 08/01/2018 and earlier. Chest x-ray 07/30/2018. FINDINGS: AP ERECT image was obtained. Multiple markedly distended loops of small bowel throughout the upper abdomen demonstrating air-fluid levels. Distal small bowel of normal caliber. Normal caliber colon with moderate stool burden in the ascending colon. No evidence of free intraperitoneal air. Linear scarring in the visualized lung bases, unchanged. IMPRESSION: 1. Recurrent partial small bowel obstruction. 2. No free intraperitoneal air. These results will be called to the ordering clinician or representative by the Radiologist Assistant, and communication documented in the PACS or zVision Dashboard. Electronically Signed   By: Evangeline Dakin M.D.   On: 08/17/2018 15:19   Ct Abdomen Pelvis W Contrast  Result Date: 08/17/2018 CLINICAL DATA:  62 year old male with concern for bowel obstruction. History of Burkitt's lymphoma and chemotherapy. EXAM: CT ABDOMEN AND PELVIS WITH CONTRAST TECHNIQUE: Multidetector CT imaging of the abdomen and pelvis was performed using the standard protocol following bolus administration of intravenous contrast.  CONTRAST:  155m ISOVUE-300 IOPAMIDOL (ISOVUE-300) INJECTION 61%, 38mOMNIPAQUE IOHEXOL 300 MG/ML SOLN COMPARISON:  CT of the abdomen pelvis dated 07/30/2018 and abdominal radiograph dated 08/17/2018 FINDINGS: Lower chest: There are bibasilar linear atelectasis/scarring. The tip of a central venous catheter is partially visualized at the cavoatrial junction. No intra-abdominal free air.  Trace fluid adjacent to the liver. Hepatobiliary: Subcentimeter hypodense focus in the dome of the liver is too small to characterize. The liver is otherwise unremarkable. No intrahepatic biliary ductal dilatation. There is a focal area of thickening at the gallbladder fundus most consistent with adenomyomatosis. This was present on the prior studies and was not metabolically active on the PET. No calcified gallstone. Pancreas: Unremarkable. No pancreatic ductal dilatation or surrounding inflammatory changes. Spleen: Normal in size without focal abnormality. Adrenals/Urinary Tract: The adrenal glands are unremarkable. There  is a 5 mm nonobstructing calculus in the interpolar aspect of the right kidney. No hydronephrosis. Additional smaller nonobstructing stone noted in the superior pole of the right kidney. Subcentimeter bilateral renal hypodense lesions are not characterized but likely represent cysts. There is no hydronephrosis on either side. There is symmetric enhancement and excretion of contrast by both kidneys. The urinary bladder is grossly unremarkable. Stomach/Bowel: There is segmental thickening of the sigmoid colon involving approximately 8 cm length. There is associated luminal narrowing and stricture of the sigmoid colon with moderate amount of retained stool within the colon proximal to this segment with an appearance of a moderate to high-grade obstruction. The thickened segment of the sigmoid colon may be related to post treatment changes and resulting fibrosis although involvement with a neoplastic process is not  excluded. This is however somewhat new since PET-CT of 06/09/2018. There is stranding of the fat in the pelvis adjacent to the sigmoid colon with tethering of the terminal ileum and appendix consistent with adhesions and scarring. There may be a degree of low-grade or partial small-bowel obstruction at the focus of tethering of the terminal ileum. There is fecalization of the terminal ileum. However, fecal content passes through the colon. The small bowel are dilated measuring up to 6 cm. The appendix is thickened and dilated with fecal content. It measures up to 2.5 cm in diameter. Multiple appendicular with noted in the distal appendix. The changes of the appendix are likely secondary to post radiation treatment. However, acute appendicitis is not entirely excluded. Clinical correlation is recommended. Vascular/Lymphatic: The abdominal aorta and IVC are grossly unremarkable. No portal venous gas. No adenopathy. Reproductive: The prostate and seminal vesicles are grossly unremarkable. Other: None Musculoskeletal: Degenerative changes of the spine. No acute osseous pathology. IMPRESSION: 1. Segmental thickening of the sigmoid colon with associated luminal narrowing and stricture and resulting obstruction at the level of the sigmoid colon. Findings likely related to post treatment changes although a colonic mass is not entirely excluded. 2. Diffusely dilated small bowel may represent an ileus or a degree of small-bowel obstruction at the level of the terminal ileum secondary to adhesions. Stool however passes into the colon. A small-bowel series may provide better evaluation of the degree of potential obstruction in the terminal ileum. 3. Dilated and thickened appendix, likely related to post treatment changes. Acute appendicitis is not entirely excluded. Clinical correlation is recommended. Electronically Signed   By: Anner Crete M.D.   On: 08/17/2018 21:43   Dg Abd Portable 1 View  Result Date:  08/17/2018 CLINICAL DATA:  62 year old male status post NG tube placement. EXAM: PORTABLE ABDOMEN - 1 VIEW COMPARISON:  CT of the abdomen pelvis dated 08/17/2018 FINDINGS: An enteric tube is partially visualized with side-port in the gastric area and tip in the left upper abdomen likely in the proximal stomach. Dilated loops of small and large bowel noted in the visualized abdomen. IMPRESSION: Enteric tube with tip in the proximal stomach. Electronically Signed   By: Anner Crete M.D.   On: 08/17/2018 22:49    Review of Systems  Constitutional: Positive for malaise/fatigue and weight loss (20 lbs last month). Negative for chills, diaphoresis and fever.  HENT: Negative.   Eyes: Negative.   Respiratory: Negative.   Cardiovascular: Negative.   Gastrointestinal: Positive for abdominal pain (not really pain, but abdomen becoming distended), constipation (NO BM since 08/12/18) and nausea. Negative for blood in stool, diarrhea, heartburn and vomiting.  Genitourinary: Negative.  Decreased urine output  Musculoskeletal: Negative.   Skin: Negative.   Neurological: Negative.   Endo/Heme/Allergies: Negative for environmental allergies and polydipsia. Bruises/bleeds easily (he is on Eliquis).  Psychiatric/Behavioral:       No acute issues, just situation anxiety and concern   Blood pressure 125/85, pulse 99, temperature 99 F (37.2 C), temperature source Oral, resp. rate 13, height _0  (1.753 m), weight 90.3 kg, SpO2 94 %. Physical Exam  Constitutional: He is oriented to person, place, and time. He appears well-developed and well-nourished. No distress.  NG in place about 400 in cannister, just feels bad and clearly anxious to find next step.  HENT:  Head: Atraumatic.  Mouth/Throat: Oropharynx is clear and moist. No oropharyngeal exudate.  NG in place  Eyes: Right eye exhibits no discharge. Left eye exhibits no discharge. No scleral icterus.  Pupils are equal  Neck: Normal range of  motion. Neck supple. No JVD present. No tracheal deviation present. No thyromegaly present.  Cardiovascular: Normal rate, regular rhythm, normal heart sounds and intact distal pulses.  No murmur heard. Respiratory: Effort normal and breath sounds normal. No respiratory distress. He has no wheezes. He has no rales. He exhibits no tenderness.  Right port  GI: Soft. He exhibits distension (mild distension). He exhibits no mass. There is tenderness (some mild tenderness). There is no rebound and no guarding.  Hypoactive BS  Musculoskeletal: He exhibits no edema or tenderness.  Lymphadenopathy:    He has no cervical adenopathy.  Neurological: He is alert and oriented to person, place, and time. No cranial nerve deficit.  Skin: Skin is warm and dry. No rash noted. He is not diaphoretic. No erythema. No pallor.  Psychiatric: He has a normal mood and affect. His behavior is normal. Judgment and thought content normal.    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 Recurrent bowel obstruction x 3, with sigmoid segmental thickening that has developed into stenosis,& dilated appendix; lymphoma VS diverticulitis VS appendicitis. DVT/bilateral pulmonary emboli; on anticoagulation last dose 08/16/2018 -currently on heparin drip Type 2 diabetes Hypertension   Plan: Has been admitted by medicine.  We will need [facility] to see, and oncology evaluation to come up with a plan for the next step.  Agree with NG decompression, bowel rest, continue IV antibiotics for now.    Samantha Ragen 08/18/2018, 7:19 AM

## 2018-08-18 NOTE — Progress Notes (Signed)
ANTICOAGULATION CONSULT NOTE - Follow Up Consult  Pharmacy Consult for heparin Indication: hx pulmonary embolus and DVT (home Eliquis on hold)  Allergies  Allergen Reactions  . Ciprofloxacin Other (See Comments)    Leg tingling    Patient Measurements: Height: 5' 9"  (175.3 cm) Weight: 199 lb (90.3 kg) IBW/kg (Calculated) : 70.7 Heparin Dosing Weight: 89 kg  Vital Signs: BP: 129/89 (12/06 1000) Pulse Rate: 105 (12/06 1000)  Labs: Recent Labs    08/17/18 1738 08/18/18 0510  HGB 9.6* 8.6*  HCT 31.7* 29.1*  PLT 214 203  CREATININE 0.64 0.54*    Estimated Creatinine Clearance: 106.3 mL/min (A) (by C-G formula based on SCr of 0.54 mg/dL (L)).   Medications:  - on Eliquis 5 mg bid PTA (last dose taken on 12/4 at 1800)  Assessment: Patient's a 62 y.o M with hx high-grade B-cell lymphoma with intra-abdominal mass encasing the small bowel and PE/DVT on Eliquis PTA, presented to the ED on 12/5 with c/o constipation and abd distention.  Abd CT on 12/5 showed bowel obstruction. Patient was transitioned from Eliquis to heparin drip on admission since patient is NPO and in case surgical intervention is needed.  Today, 08/18/2018: - first heparin level is elevated at 1.09 but this may be from residual effect of Eliquis, aPTT is subtherapeutic at 45.  Will adjust heparin drip based on aPTT for now until heparin level and aPTT levels correlate - then will dose based on heparin level. - hgb down slightly to 8.6, plts stable - Per RN, no bleeding noted  Goal of Therapy:  Heparin level 0.3-0.7 units/ml aPTT 66-102 seconds Monitor platelets by anticoagulation protocol: Yes   Plan:  - Increase heparin drip to 1200 units/hr - check 6 hr aPTT and heparin level - monitor for s/s bleeding   Arnie Maiolo P 08/18/2018,11:27 AM

## 2018-08-18 NOTE — H&P (Signed)
History and Physical    Mark Frey JYN:[id number] DOB: 19-Jun-1956 DOA: 08/17/2018  PCP: [name], MD  Patient coming from: Home.  Chief Complaint: Constipation.  Abdominal distention.  HPI: Mark Frey is a 62 y.o. male with history of lymphoma who was recently admitted for small bowel obstruction related to the lymphoma treated conservatively presents to the ER because he has not moved bowels for last 7 days.  Patient also noticed increasing bowel distention.  Denies any vomiting or abdominal pain.  ED Course: In the ER CAT scan shows bowel obstruction for which patient has been placed on NG tube suction.  On-call general surgeon Dr. Lucia Gaskins has been consulted and admitted for further management.  Review of Systems: As per HPI, rest all negative.   Past Medical History:  Diagnosis Date  . ALLERGIC RHINITIS   . Cancer ([place])    Lymphoma   . Diabetes mellitus   . Hyperlipidemia     Past Surgical History:  Procedure Laterality Date  . BIOPSY  03/15/2018   Procedure: BIOPSY;  Surgeon: Milus Banister, MD;  Location: [facility];  Service: Endoscopy;;  . CLEFT PALATE REPAIR    . COLONOSCOPY N/A 03/15/2018   Procedure: COLONOSCOPY;  Surgeon: Milus Banister, MD;  Location: [facility];  Service: Endoscopy;  Laterality: N/A;  . deviated septum repair     slight improvement  . ESOPHAGOGASTRODUODENOSCOPY N/A 03/15/2018   Procedure: ESOPHAGOGASTRODUODENOSCOPY (EGD);  Surgeon: Milus Banister, MD;  Location: [name];  Service: Endoscopy;  Laterality: N/A;  . IR IMAGING GUIDED PORT INSERTION  03/17/2018  . TONSILLECTOMY       reports that he has never smoked. He has never used smokeless tobacco. He reports that he drinks alcohol. He reports that he does not use drugs.  Allergies  Allergen Reactions  . Ciprofloxacin Other (See Comments)    Leg tingling    Family History  Problem Relation Age of Onset  . Lung cancer Mother        smoker  . Brain cancer  Mother        metastasis  . AAA (abdominal aortic aneurysm) Father        smoker    Prior to Admission medications   Medication Sig Start Date End Date Taking? Authorizing Provider  apixaban (ELIQUIS) 5 MG TABS tablet Take 1 tablet (5 mg total) by mouth 2 (two) times daily. 06/16/18  Yes [name], MD  JANUMET 50-1000 MG tablet TAKE 1 TABLET TWICE DAILY  WITH MEALS 11/29/17  Yes [name], MD  Magnesium 500 MG CAPS Take 1 capsule (500 mg total) by mouth 2 (two) times daily. 06/27/18  Yes [name], MD  Probiotic Product (PROBIOTIC-10) CAPS Take 1 capsule by mouth daily.   Yes [provider]  blood glucose meter kit and supplies Dispense based on patient and insurance preference. Use daily as directed. (E11.9). 03/31/18   [name], MD  glucose blood (FREESTYLE TEST STRIPS) test strip Use to check blood sugar daily 03/30/18   [name], MD  lidocaine-prilocaine (EMLA) cream Apply 1 application topically as needed. Patient taking differently: Apply 1 application topically daily as needed (access port).  03/27/18   Truitt Merle, MD    Physical Exam: Vitals:   08/17/18 1830 08/17/18 1900 08/17/18 2000 08/17/18 2115  BP: 108/82     Pulse: (!) 102 (!) 108 (!) 104 (!) 109  Resp: _0 Temp:  TempSrc:      SpO2: 94% 98% 94% 94%  Weight:      Height:          Constitutional: Moderately built and nourished. Vitals:   08/17/18 1830 08/17/18 1900 08/17/18 2000 08/17/18 2115  BP: 108/82     Pulse: (!) 102 (!) 108 (!) 104 (!) 109  Resp: _0 Temp:      TempSrc:      SpO2: 94% 98% 94% 94%  Weight:      Height:       Eyes: Anicteric no pallor. ENMT: No discharge from the ears eyes nose or mouth. Neck: No mass felt.  No neck rigidity. Respiratory: No rhonchi or crepitations. Cardiovascular: S1-S2 heard no murmurs appreciated. Abdomen: Soft nontender bowel sounds present. Musculoskeletal: No edema.  No joint  effusion. Skin: No rash. Neurologic: Alert awake oriented to time place and person.  Moves all extremities. Psychiatric: Appears normal.  Normal affect.   Labs on Admission: I have personally reviewed following labs and imaging studies  CBC: Recent Labs  Lab 08/11/18 1210 08/17/18 1738  WBC 6.9 7.2  NEUTROABS 0.7* 4.1  HGB 9.7* 9.6*  HCT 30.8* 31.7*  MCV 94.8 98.4  PLT 205 629   Basic Metabolic Panel: Recent Labs  Lab 08/11/18 1210 08/17/18 1738  NA 134* 136  K 3.5 4.3  CL 100 97*  CO2 23 29  GLUCOSE 127* 113*  BUN 7* 13  CREATININE 0.60* 0.64  CALCIUM 8.9 8.7*  MG 1.7  --    GFR: Estimated Creatinine Clearance: 106.3 mL/min (by C-G formula based on SCr of 0.64 mg/dL). Liver Function Tests: Recent Labs  Lab 08/11/18 1210 08/17/18 1738  AST 20 13*  ALT 16 13  ALKPHOS 53 56  BILITOT 0.4 0.5  PROT 5.8* 5.7*  ALBUMIN 2.5* 2.7*   Recent Labs  Lab 08/17/18 1738  LIPASE 19   No results for input(s): AMMONIA in the last 168 hours. Coagulation Profile: No results for input(s): INR, PROTIME in the last 168 hours. Cardiac Enzymes: No results for input(s): CKTOTAL, CKMB, CKMBINDEX, TROPONINI in the last 168 hours. BNP (last 3 results) No results for input(s): PROBNP in the last 8760 hours. HbA1C: No results for input(s): HGBA1C in the last 72 hours. CBG: No results for input(s): GLUCAP in the last 168 hours. Lipid Profile: No results for input(s): CHOL, HDL, LDLCALC, TRIG, CHOLHDL, LDLDIRECT in the last 72 hours. Thyroid Function Tests: No results for input(s): TSH, T4TOTAL, FREET4, T3FREE, THYROIDAB in the last 72 hours. Anemia Panel: No results for input(s): VITAMINB12, FOLATE, FERRITIN, TIBC, IRON, RETICCTPCT in the last 72 hours. Urine analysis:    Component Value Date/Time   COLORURINE AMBER (A) 08/17/2018 1624   APPEARANCEUR CLEAR 08/17/2018 1624   LABSPEC >1.046 (H) 08/17/2018 1624   PHURINE 5.0 08/17/2018 1624   GLUCOSEU NEGATIVE 08/17/2018  1624   HGBUR NEGATIVE 08/17/2018 1624   HGBUR negative 01/20/2009 0840   BILIRUBINUR NEGATIVE 08/17/2018 1624   BILIRUBINUR Small 01/30/2018 1652   KETONESUR NEGATIVE 08/17/2018 1624   PROTEINUR NEGATIVE 08/17/2018 1624   UROBILINOGEN 2.0 (A) 01/30/2018 1652   UROBILINOGEN 0.2 01/20/2009 0840   NITRITE NEGATIVE 08/17/2018 1624   LEUKOCYTESUR NEGATIVE 08/17/2018 1624   Sepsis Labs: _1 (procalcitonin:4,lacticidven:4) )No results found for this or any previous visit (from the past 240 hour(s)).   Radiological Exams on Admission: Dg Abd 1 View  Result Date: 08/17/2018 CLINICAL DATA:  62 year old presenting with abdominal distention and  absence of bowel movements over the past 5 days. Patient was recently released from the hospital after having a partial small bowel obstruction. Personal history of lymphoma. EXAM: ABDOMEN - 1 VIEW COMPARISON:  08/11/2018, 08/01/2018 and earlier. Chest x-ray 07/30/2018. FINDINGS: AP ERECT image was obtained. Multiple markedly distended loops of small bowel throughout the upper abdomen demonstrating air-fluid levels. Distal small bowel of normal caliber. Normal caliber colon with moderate stool burden in the ascending colon. No evidence of free intraperitoneal air. Linear scarring in the visualized lung bases, unchanged. IMPRESSION: 1. Recurrent partial small bowel obstruction. 2. No free intraperitoneal air. These results will be called to the ordering clinician or representative by the Radiologist Assistant, and communication documented in the PACS or zVision Dashboard. Electronically Signed   By: Evangeline Dakin M.D.   On: 08/17/2018 15:19   Ct Abdomen Pelvis W Contrast  Result Date: 08/17/2018 CLINICAL DATA:  62 year old male with concern for bowel obstruction. History of Burkitt's lymphoma and chemotherapy. EXAM: CT ABDOMEN AND PELVIS WITH CONTRAST TECHNIQUE: Multidetector CT imaging of the abdomen and pelvis was performed using the standard protocol  following bolus administration of intravenous contrast. CONTRAST:  112m ISOVUE-300 IOPAMIDOL (ISOVUE-300) INJECTION 61%, 329mOMNIPAQUE IOHEXOL 300 MG/ML SOLN COMPARISON:  CT of the abdomen pelvis dated 07/30/2018 and abdominal radiograph dated 08/17/2018 FINDINGS: Lower chest: There are bibasilar linear atelectasis/scarring. The tip of a central venous catheter is partially visualized at the cavoatrial junction. No intra-abdominal free air.  Trace fluid adjacent to the liver. Hepatobiliary: Subcentimeter hypodense focus in the dome of the liver is too small to characterize. The liver is otherwise unremarkable. No intrahepatic biliary ductal dilatation. There is a focal area of thickening at the gallbladder fundus most consistent with adenomyomatosis. This was present on the prior studies and was not metabolically active on the PET. No calcified gallstone. Pancreas: Unremarkable. No pancreatic ductal dilatation or surrounding inflammatory changes. Spleen: Normal in size without focal abnormality. Adrenals/Urinary Tract: The adrenal glands are unremarkable. There is a 5 mm nonobstructing calculus in the interpolar aspect of the right kidney. No hydronephrosis. Additional smaller nonobstructing stone noted in the superior pole of the right kidney. Subcentimeter bilateral renal hypodense lesions are not characterized but likely represent cysts. There is no hydronephrosis on either side. There is symmetric enhancement and excretion of contrast by both kidneys. The urinary bladder is grossly unremarkable. Stomach/Bowel: There is segmental thickening of the sigmoid colon involving approximately 8 cm length. There is associated luminal narrowing and stricture of the sigmoid colon with moderate amount of retained stool within the colon proximal to this segment with an appearance of a moderate to high-grade obstruction. The thickened segment of the sigmoid colon may be related to post treatment changes and resulting  fibrosis although involvement with a neoplastic process is not excluded. This is however somewhat new since PET-CT of 06/09/2018. There is stranding of the fat in the pelvis adjacent to the sigmoid colon with tethering of the terminal ileum and appendix consistent with adhesions and scarring. There may be a degree of low-grade or partial small-bowel obstruction at the focus of tethering of the terminal ileum. There is fecalization of the terminal ileum. However, fecal content passes through the colon. The small bowel are dilated measuring up to 6 cm. The appendix is thickened and dilated with fecal content. It measures up to 2.5 cm in diameter. Multiple appendicular with noted in the distal appendix. The changes of the appendix are likely secondary to post radiation treatment. However, acute  appendicitis is not entirely excluded. Clinical correlation is recommended. Vascular/Lymphatic: The abdominal aorta and IVC are grossly unremarkable. No portal venous gas. No adenopathy. Reproductive: The prostate and seminal vesicles are grossly unremarkable. Other: None Musculoskeletal: Degenerative changes of the spine. No acute osseous pathology. IMPRESSION: 1. Segmental thickening of the sigmoid colon with associated luminal narrowing and stricture and resulting obstruction at the level of the sigmoid colon. Findings likely related to post treatment changes although a colonic mass is not entirely excluded. 2. Diffusely dilated small bowel may represent an ileus or a degree of small-bowel obstruction at the level of the terminal ileum secondary to adhesions. Stool however passes into the colon. A small-bowel series may provide better evaluation of the degree of potential obstruction in the terminal ileum. 3. Dilated and thickened appendix, likely related to post treatment changes. Acute appendicitis is not entirely excluded. Clinical correlation is recommended. Electronically Signed   By: Anner Crete M.D.   On:  08/17/2018 21:43   Dg Abd Portable 1 View  Result Date: 08/17/2018 CLINICAL DATA:  62 year old male status post NG tube placement. EXAM: PORTABLE ABDOMEN - 1 VIEW COMPARISON:  CT of the abdomen pelvis dated 08/17/2018 FINDINGS: An enteric tube is partially visualized with side-port in the gastric area and tip in the left upper abdomen likely in the proximal stomach. Dilated loops of small and large bowel noted in the visualized abdomen. IMPRESSION: Enteric tube with tip in the proximal stomach. Electronically Signed   By: Anner Crete M.D.   On: 08/17/2018 22:49      Assessment/Plan Principal Problem:   SBO (small bowel obstruction) (HCC) Active Problems:   Controlled diabetes mellitus type II without complication (HCC)   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HCC)   Deep vein thrombosis (DVT) of femoral vein of right lower extremity ([place])    1. Small bowel obstruction -General surgery has been consulted patient has been placed on NG tube suction.  N.p.o.  IV fluids.  Repeat KUB in a.m.  Further recommendation per general surgery. 2. Diabetes mellitus type 2 has been placed on sliding scale coverage. 3. History of DVT takes apixaban has not taken for last 2 days.  We will keep patient on heparin. 4. 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  Follow CBC. 5. Lymphoma being followed by oncologist [name]. 6. Severe protein calorie malnutrition.   DVT prophylaxis: Heparin infusion. Code Status: Full code. Family Communication: Discussed with patient. Disposition Plan: Home. Consults called: General surgery. Admission status: Inpatient.   [name] MD [facility] Pager [phone].  If 7PM-7AM, please contact night-coverage [email or web address] Password [email or web address]  08/18/2018, 1:47 AM

## 2018-08-18 NOTE — ED Notes (Addendum)
GI MD at bedside

## 2018-08-18 NOTE — Progress Notes (Signed)
Pharmacy Antibiotic Note  Mark Frey is a 62 y.o. male with a h/o lymphoma admitted on 08/17/2018 with SBO.  Pharmacy has been consulted for Zosyn dosing per surgery until r/o inflammatory process/appendicitis.   Plan: Zosyn 3.375 g EI q 8h.   Height: 5' 9"  (175.3 cm) Weight: 199 lb (90.3 kg) IBW/kg (Calculated) : 70.7  Temp (24hrs), Avg:98.5 F (36.9 C), Min:98 F (36.7 C), Max:99 F (37.2 C)  Recent Labs  Lab 08/11/18 1210 08/17/18 1738 08/18/18 0510  WBC 6.9 7.2 5.1  CREATININE 0.60* 0.64 0.54*    Estimated Creatinine Clearance: 106.3 mL/min (A) (by C-G formula based on SCr of 0.54 mg/dL (L)).    Allergies  Allergen Reactions  . Ciprofloxacin Other (See Comments)    Leg tingling    Antimicrobials this admission: 12/6 Zosyn >>   Dose adjustments this admission:  Microbiology results:   Thank you for allowing pharmacy to be a part of this patient's care.  Ulice Dash D 08/18/2018 10:38 AM

## 2018-08-18 NOTE — ED Notes (Signed)
Pt. CBG 97, RN,Lindsay made aware.

## 2018-08-18 NOTE — Consult Note (Addendum)
Referring Provider:  [facility] Surgery Primary Care Physician:  [name], MD Primary Gastroenterologist:  Oretha Caprice, MD     Reason for Consultation:   Bowel obstruction    ASSESSMENT    61. 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  Completed chemo late October.  Since then patient has had 2 admissions for SBO though CT scans and PET scans have both previously demonstrated wall thickening of sigmoid as well.  Of note, colonoscopy in July negative for any sigmoid findings . Now with definite obstruction at level of sigmoid colon.  Not clear if this is intraluminal obstruction (lymphoma).  Extrinsic compression is a possibility.  Scarring from diverticulitis?  Infectious seems less likely -Patient will likely need lower endoscopy at some point  Colon is full of stool on imaging. Given obstructive process with purge from below with multiple enemas.  Follow KUBs. -continue NGT with LIS -am abdominal films.  2. DVT / PE. Home Xarelto on hold. On heparin gtt.  -holding  heparin gtt for procedure.    PLAN:    HPI: Mark Frey is a 62 y.o. male who we initially saw in the hospital in July after he presented with new DVT / PE and right sided intraabdominal mass encasing small bowel and rectal bleeding.  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  There was no involvement of upper GI tract proven by EGD.  Right colon mass biopsy compatible with non-Hodgkin's lymphoma. He underwent chemotherapy ( [name])) and PET / CT in early August showed improvement. He was admitted on the 8th of this month with high grade SBO / diarrhea. Obstructive process appearing to be 6 cm proximal to ICV, possibly from scar / post treatment changes. Improved with conservative measures. Readmitted on 07/30/18 with distention / nausea / vomiting / diarrhea. CT scan  concerning for bowel obstruction again. General Surgery followed during that admission. CT scan >>> dilated appendix , 7 cm long segment of sigmoid thickening with stranding. Diverticulitis?  Treated with antibiotics, improved and discharged on 08/04/18.   Patient completed chemo late October. He has struggled with loose stools for a few weeks. Of note, C-diff was negative during 07/21/18 and 11/7 admission. Took one imodium over the weekend and diarrhea ceased. Patient had been on a liquid diet for weeks but recently able to transition to solids though hasn't eaten much. A few days ago his appetite diminished. He been having loud intestinal gurgling and wife noticed some increasing abdominal distention.  He has not had a bowel movement in several days . Knowing the signs of bowel obstruction patient has re-presented to the emergency department.  Repeat CT scan shows segmental thickening of the sigmoid colon with associated luminal narrowing and stricture resulting in obstruction at the level of the sigmoid.  Mass not excluded but posttreatment changes favored.  Diffusely dilated small bowel loops representing ileus versus degree of small bowel obstruction at the level of the terminal ileum.  Stool however does pass into the colon.  In fact there is stool throughout the colon appendix is dilated and thickened, possibly posttreatment changes.  She has not had any significant abdominal pain, just some generalized gas type discomfort.  Certainly no acute right lower quadrant pain to suggest diverticulitis    Past Medical History:  Diagnosis Date  . ALLERGIC RHINITIS   . Cancer ([place])  Lymphoma   . Diabetes mellitus   . Hyperlipidemia     Past Surgical History:  Procedure Laterality Date  . BIOPSY  03/15/2018   Procedure: BIOPSY;  Surgeon: Milus Banister, MD;  Location: [facility] ENDOSCOPY;  Service: Endoscopy;;  . CLEFT PALATE REPAIR    . COLONOSCOPY N/A 03/15/2018   Procedure: COLONOSCOPY;  Surgeon:  Milus Banister, MD;  Location: [facility] ENDOSCOPY;  Service: Endoscopy;  Laterality: N/A;  . deviated septum repair     slight improvement  . ESOPHAGOGASTRODUODENOSCOPY N/A 03/15/2018   Procedure: ESOPHAGOGASTRODUODENOSCOPY (EGD);  Surgeon: Milus Banister, MD;  Location: [name];  Service: Endoscopy;  Laterality: N/A;  . IR IMAGING GUIDED PORT INSERTION  03/17/2018  . TONSILLECTOMY      Prior to Admission medications   Medication Sig Start Date End Date Taking? Authorizing Provider  apixaban (ELIQUIS) 5 MG TABS tablet Take 1 tablet (5 mg total) by mouth 2 (two) times daily. 06/16/18  Yes [name], MD  JANUMET 50-1000 MG tablet TAKE 1 TABLET TWICE DAILY  WITH MEALS 11/29/17  Yes [name], MD  Magnesium 500 MG CAPS Take 1 capsule (500 mg total) by mouth 2 (two) times daily. 06/27/18  Yes [name], MD  Probiotic Product (PROBIOTIC-10) CAPS Take 1 capsule by mouth daily.   Yes [provider]  blood glucose meter kit and supplies Dispense based on patient and insurance preference. Use daily as directed. (E11.9). 03/31/18   [name], MD  glucose blood (FREESTYLE TEST STRIPS) test strip Use to check blood sugar daily 03/30/18   [name], MD  lidocaine-prilocaine (EMLA) cream Apply 1 application topically as needed. Patient taking differently: Apply 1 application topically daily as needed (access port).  03/27/18   Truitt Merle, MD    Current Facility-Administered Medications  Medication Dose Route Frequency Provider Last Rate Last Dose  . 0.9 %  sodium chloride infusion   Intravenous Continuous [name], MD 100 mL/hr at 08/18/18 0247    . acetaminophen (TYLENOL) tablet 650 mg  650 mg Oral Q6H PRN [name], MD       Or  . acetaminophen (TYLENOL) suppository 650 mg  650 mg Rectal Q6H PRN [name], MD      . heparin ADULT infusion 100 units/mL (25000 units/289m sodium chloride 0.45%)  1,000 Units/hr Intravenous  Continuous KRise Patience MD 10 mL/hr at 08/18/18 0509 1,000 Units/hr at 08/18/18 0509  . insulin aspart (novoLOG) injection 0-9 Units  0-9 Units Subcutaneous Q4H KRise Patience MD      . morphine 2 MG/ML injection 1 mg  1 mg Intravenous Q2H PRN KRise Patience MD      . ondansetron ([facility] tablet 4 mg  4 mg Oral Q6H PRN KRise Patience MD       Or  . ondansetron ([facility] injection 4 mg  4 mg Intravenous Q6H PRN KRise Patience MD      . piperacillin-tazobactam (ZOSYN) IVPB 3.375 g  3.375 g Intravenous Q8H JEarnstine Regal PA-C 12.5 mL/hr at 08/18/18 0945 3.375 g at 08/18/18 0945   Current Outpatient Medications  Medication Sig Dispense Refill  . apixaban (ELIQUIS) 5 MG TABS tablet Take 1 tablet (5 mg total) by mouth 2 (two) times daily. 60 tablet 3  . JANUMET 50-1000 MG tablet TAKE 1 TABLET TWICE DAILY  WITH MEALS 180 tablet 3  . Magnesium 500 MG CAPS Take 1 capsule (500 mg total) by  mouth 2 (two) times daily. 60 capsule 0  . Probiotic Product (PROBIOTIC-10) CAPS Take 1 capsule by mouth daily.    . blood glucose meter kit and supplies Dispense based on patient and insurance preference. Use daily as directed. (E11.9). 1 each 0  . glucose blood (FREESTYLE TEST STRIPS) test strip Use to check blood sugar daily 100 each 4  . lidocaine-prilocaine (EMLA) cream Apply 1 application topically as needed. (Patient taking differently: Apply 1 application topically daily as needed (access port). ) 30 g 2    Allergies as of 08/17/2018 - Review Complete 08/17/2018  Allergen Reaction Noted  . Ciprofloxacin Other (See Comments) 01/30/2018    Family History  Problem Relation Age of Onset  . Lung cancer Mother        smoker  . Brain cancer Mother        metastasis  . AAA (abdominal aortic aneurysm) Father        smoker    Social History   Socioeconomic History  . Marital status: Married    Spouse name: lisa  . Number of children: 0  . Years of education: Not  on file  . Highest education level: Not on file  Occupational History  . Not on file  Social Needs  . Financial resource strain: Not hard at all  . Food insecurity:    Worry: Never true    Inability: Never true  . Transportation needs:    Medical: No    Non-medical: No  Tobacco Use  . Smoking status: Never Smoker  . Smokeless tobacco: Never Used  Substance and Sexual Activity  . Alcohol use: Yes    Comment: occasional  . Drug use: No  . Sexual activity: Yes  Lifestyle  . Physical activity:    Days per week: 0 days    Minutes per session: 0 min  . Stress: Not at all  Relationships  . Social connections:    Talks on phone: More than three times a week    Gets together: More than three times a week    Attends religious service: 1 to 4 times per year    Active member of club or organization: No    Attends meetings of clubs or organizations: Never    Relationship status: Married  . Intimate partner violence:    Fear of current or ex partner: No    Emotionally abused: No    Physically abused: No    Forced sexual activity: No  Other Topics Concern  . Not on file  Social History Narrative   Married 1985. No kids. 4 small dogs.       Works in Financial trader, residential      Hobbies: work on cars, Haematologist, exercise as able    Review of Systems: All systems reviewed and negative except where noted in HPI.  Physical Exam: Vital signs in last 24 hours: Temp:  [98 F (36.7 C)-99 F (37.2 C)] 99 F (37.2 C) (12/05 1554) Pulse Rate:  [98-128] 105 (12/06 0830) Resp:  [11-20] 18 (12/06 0830) BP: (108-133)/(74-98) 120/80 (12/06 0830) SpO2:  [90 %-98 %] 95 % (12/06 0830) Weight:  [90.3 kg-90.5 kg] 90.3 kg (12/05 1555)   General:   Alert, well-developed,  male in NAD Psych:  Pleasant, cooperative. Normal mood and affect. Eyes:  Pupils equal, sclera clear, no icterus.   Conjunctiva pink. Ears:  Normal auditory acuity. Nose:  No deformity, discharge,  or  lesions. Neck:  Supple; no masses Lungs:  Clear throughout to auscultation.   No wheezes, crackles, or rhonchi.  Heart:  Regular rate and rhythm; no murmurs, no lower extremity edema Abdomen:  Soft, non-tender, mod distended with tympany. No obstructive bowel sounds but can't really evaluated since has NGT to LIS.  Msk:  Symmetrical without gross deformities. . Neurologic:  Alert and  oriented x4;  grossly normal neurologically. Skin:  Intact without significant lesions or rashes.   Intake/Output from previous day: 12/05 0701 - 12/06 0700 In: -  Out: 125 [Urine:125] Intake/Output this shift: No intake/output data recorded.  Lab Results: Recent Labs    08/17/18 1738 08/18/18 0510  WBC 7.2 5.1  HGB 9.6* 8.6*  HCT 31.7* 29.1*  PLT 214 203   BMET Recent Labs    08/17/18 1738 08/18/18 0510  NA 136 136  K 4.3 3.8  CL 97* 101  CO2 29 26  GLUCOSE 113* 102*  BUN 13 12  CREATININE 0.64 0.54*  CALCIUM 8.7* 8.3*   LFT Recent Labs    08/18/18 0510  PROT 5.0*  ALBUMIN 2.7*  AST 11*  ALT 10  ALKPHOS 47  BILITOT 0.4   PT/INR No results for input(s): LABPROT, INR in the last 72 hours. Hepatitis Panel No results for input(s): HEPBSAG, HCVAB, HEPAIGM, HEPBIGM in the last 72 hours.    Studies/Results: Dg Abd 1 View  Result Date: 08/17/2018 CLINICAL DATA:  62 year old presenting with abdominal distention and absence of bowel movements over the past 5 days. Patient was recently released from the hospital after having a partial small bowel obstruction. Personal history of lymphoma. EXAM: ABDOMEN - 1 VIEW COMPARISON:  08/11/2018, 08/01/2018 and earlier. Chest x-ray 07/30/2018. FINDINGS: AP ERECT image was obtained. Multiple markedly distended loops of small bowel throughout the upper abdomen demonstrating air-fluid levels. Distal small bowel of normal caliber. Normal caliber colon with moderate stool burden in the ascending colon. No evidence of free intraperitoneal air. Linear  scarring in the visualized lung bases, unchanged. IMPRESSION: 1. Recurrent partial small bowel obstruction. 2. No free intraperitoneal air. These results will be called to the ordering clinician or representative by the Radiologist Assistant, and communication documented in the PACS or zVision Dashboard. Electronically Signed   By: Evangeline Dakin M.D.   On: 08/17/2018 15:19   Ct Abdomen Pelvis W Contrast  Result Date: 08/17/2018 CLINICAL DATA:  62 year old male with concern for bowel obstruction. History of Burkitt's lymphoma and chemotherapy. EXAM: CT ABDOMEN AND PELVIS WITH CONTRAST TECHNIQUE: Multidetector CT imaging of the abdomen and pelvis was performed using the standard protocol following bolus administration of intravenous contrast. CONTRAST:  118m ISOVUE-300 IOPAMIDOL (ISOVUE-300) INJECTION 61%, 346mOMNIPAQUE IOHEXOL 300 MG/ML SOLN COMPARISON:  CT of the abdomen pelvis dated 07/30/2018 and abdominal radiograph dated 08/17/2018 FINDINGS: Lower chest: There are bibasilar linear atelectasis/scarring. The tip of a central venous catheter is partially visualized at the cavoatrial junction. No intra-abdominal free air.  Trace fluid adjacent to the liver. Hepatobiliary: Subcentimeter hypodense focus in the dome of the liver is too small to characterize. The liver is otherwise unremarkable. No intrahepatic biliary ductal dilatation. There is a focal area of thickening at the gallbladder fundus most consistent with adenomyomatosis. This was present on the prior studies and was not metabolically active on the PET. No calcified gallstone. Pancreas: Unremarkable. No pancreatic ductal dilatation or surrounding inflammatory changes. Spleen: Normal in size without focal abnormality. Adrenals/Urinary Tract: The adrenal glands are unremarkable. There is a 5 mm nonobstructing calculus in the interpolar aspect of the  right kidney. No hydronephrosis. Additional smaller nonobstructing stone noted in the superior pole  of the right kidney. Subcentimeter bilateral renal hypodense lesions are not characterized but likely represent cysts. There is no hydronephrosis on either side. There is symmetric enhancement and excretion of contrast by both kidneys. The urinary bladder is grossly unremarkable. Stomach/Bowel: There is segmental thickening of the sigmoid colon involving approximately 8 cm length. There is associated luminal narrowing and stricture of the sigmoid colon with moderate amount of retained stool within the colon proximal to this segment with an appearance of a moderate to high-grade obstruction. The thickened segment of the sigmoid colon may be related to post treatment changes and resulting fibrosis although involvement with a neoplastic process is not excluded. This is however somewhat new since PET-CT of 06/09/2018. There is stranding of the fat in the pelvis adjacent to the sigmoid colon with tethering of the terminal ileum and appendix consistent with adhesions and scarring. There may be a degree of low-grade or partial small-bowel obstruction at the focus of tethering of the terminal ileum. There is fecalization of the terminal ileum. However, fecal content passes through the colon. The small bowel are dilated measuring up to 6 cm. The appendix is thickened and dilated with fecal content. It measures up to 2.5 cm in diameter. Multiple appendicular with noted in the distal appendix. The changes of the appendix are likely secondary to post radiation treatment. However, acute appendicitis is not entirely excluded. Clinical correlation is recommended. Vascular/Lymphatic: The abdominal aorta and IVC are grossly unremarkable. No portal venous gas. No adenopathy. Reproductive: The prostate and seminal vesicles are grossly unremarkable. Other: None Musculoskeletal: Degenerative changes of the spine. No acute osseous pathology. IMPRESSION: 1. Segmental thickening of the sigmoid colon with associated luminal narrowing and  stricture and resulting obstruction at the level of the sigmoid colon. Findings likely related to post treatment changes although a colonic mass is not entirely excluded. 2. Diffusely dilated small bowel may represent an ileus or a degree of small-bowel obstruction at the level of the terminal ileum secondary to adhesions. Stool however passes into the colon. A small-bowel series may provide better evaluation of the degree of potential obstruction in the terminal ileum. 3. Dilated and thickened appendix, likely related to post treatment changes. Acute appendicitis is not entirely excluded. Clinical correlation is recommended. Electronically Signed   By: Anner Crete M.D.   On: 08/17/2018 21:43   Dg Abd Portable 1 View  Result Date: 08/17/2018 CLINICAL DATA:  62 year old male status post NG tube placement. EXAM: PORTABLE ABDOMEN - 1 VIEW COMPARISON:  CT of the abdomen pelvis dated 08/17/2018 FINDINGS: An enteric tube is partially visualized with side-port in the gastric area and tip in the left upper abdomen likely in the proximal stomach. Dilated loops of small and large bowel noted in the visualized abdomen. IMPRESSION: Enteric tube with tip in the proximal stomach. Electronically Signed   By: Anner Crete M.D.   On: 08/17/2018 22:49     Tye Savoy, NP-C @  08/18/2018, 10:00 AM

## 2018-08-18 NOTE — Progress Notes (Signed)
PROGRESS NOTE  Mark Frey GGE:[id number] DOB: 1956/01/28 DOA: 08/17/2018 PCP: [name], MD  HPI/Recap of past 24 hours: HPI from Dr Jenny Reichmann is a 62 y.o. male with history of lymphoma, DM, Hx of DVT, who was recently admitted for small bowel obstruction related to the lymphoma treated conservatively presents to the ER because he has not moved bowels for last 7 days.  Patient also noticed increasing bowel distention. Denies any vomiting or abdominal pain. In the ER CAT scan shows bowel obstruction for which patient has been placed on NG tube suction.  On-call general surgeon has been consulted and admitted for further management.   Today, patient still with some abdominal distention, with mild generalized pain.  Denies any nausea/vomiting.  Assessment/Plan: Principal Problem:   SBO (small bowel obstruction) (HCC) Active Problems:   Controlled diabetes mellitus type II without complication (HCC)   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HCC)   Deep vein thrombosis (DVT) of femoral vein of right lower extremity (HCC)  Recurrent small bowel obstruction CT abdomen/pelvis showed: Diffusely dilated small bowel may represent an ileus or a degree of SBO at the level of the terminal ileum secondary to adhesions. Segmental thickening of the sigmoid colon with associated luminal narrowing and stricture and resulting obstruction at the level of the sigmoid colon. Findings likely related to post treatment changes although a colonic mass is not entirely excluded General surgery on board, appreciate recs GI on board, appreciate recs N.p.o., IV fluids, NG tube, pain management IV Zosyn as per general surgery Monitor closely  Diabetes mellitus type 2 SSI, Accu-Cheks, hypoglycemic protocol  History of DVT Hold home apixaban, start heparin drip pending procedures  High-grade cell lymphoma Diagnosed in July, completed chemo late October Followed by oncologist [name]          Malnutrition Type:      Malnutrition Characteristics:      Nutrition Interventions:       Estimated body mass index is 29.39 kg/m as calculated from the following:   Height as of this encounter: 5' 9"  (1.753 m).   Weight as of this encounter: 90.3 kg.     Code Status: Full  Family Communication: Wife at bedside  Disposition Plan: To be determined   Consultants:  General surgery  GI  Procedures:  None  Antimicrobials:  IV Zosyn  DVT prophylaxis: Heparin drip   Objective: Vitals:   08/18/18 0915 08/18/18 0930 08/18/18 0945 08/18/18 1000  BP:  124/80  129/89  Pulse: 97 99 96 (!) 105  Resp: 13 13 15 14   Temp:      TempSrc:      SpO2: 94% 92% 97% 95%  Weight:      Height:        Intake/Output Summary (Last 24 hours) at 08/18/2018 1200 Last data filed at 08/18/2018 0206 Gross per 24 hour  Intake -  Output 125 ml  Net -125 ml   Filed Weights   08/17/18 1555  Weight: 90.3 kg    Exam:   General: NAD  Cardiovascular: S1, S2 present  Respiratory: CTA B  Abdomen: Soft, nontender, + distention, hypoactive bowel sounds  Musculoskeletal: No pedal edema bilaterally  Skin: Normal  Psychiatry: Normal mood   Data Reviewed: CBC: Recent Labs  Lab 08/11/18 1210 08/17/18 1738 08/18/18 0510  WBC 6.9 7.2 5.1  NEUTROABS 0.7* 4.1 2.8  HGB 9.7* 9.6* 8.6*  HCT 30.8* 31.7* 29.1*  MCV 94.8 98.4 100.0  PLT 205 214  884   Basic Metabolic Panel: Recent Labs  Lab 08/11/18 1210 08/17/18 1738 08/18/18 0510  NA 134* 136 136  K 3.5 4.3 3.8  CL 100 97* 101  CO2 23 29 26   GLUCOSE 127* 113* 102*  BUN 7* 13 12  CREATININE 0.60* 0.64 0.54*  CALCIUM 8.9 8.7* 8.3*  MG 1.7  --   --    GFR: Estimated Creatinine Clearance: 106.3 mL/min (A) (by C-G formula based on SCr of 0.54 mg/dL (L)). Liver Function Tests: Recent Labs  Lab 08/11/18 1210 08/17/18 1738 08/18/18 0510  AST 20 13* 11*  ALT 16 13 10   ALKPHOS 53 56 47  BILITOT 0.4 0.5  0.4  PROT 5.8* 5.7* 5.0*  ALBUMIN 2.5* 2.7* 2.7*   Recent Labs  Lab 08/17/18 1738  LIPASE 19   No results for input(s): AMMONIA in the last 168 hours. Coagulation Profile: No results for input(s): INR, PROTIME in the last 168 hours. Cardiac Enzymes: No results for input(s): CKTOTAL, CKMB, CKMBINDEX, TROPONINI in the last 168 hours. BNP (last 3 results) No results for input(s): PROBNP in the last 8760 hours. HbA1C: No results for input(s): HGBA1C in the last 72 hours. CBG: Recent Labs  Lab 08/18/18 0228 08/18/18 0416 08/18/18 0828  GLUCAP 110* 94 98   Lipid Profile: No results for input(s): CHOL, HDL, LDLCALC, TRIG, CHOLHDL, LDLDIRECT in the last 72 hours. Thyroid Function Tests: No results for input(s): TSH, T4TOTAL, FREET4, T3FREE, THYROIDAB in the last 72 hours. Anemia Panel: No results for input(s): VITAMINB12, FOLATE, FERRITIN, TIBC, IRON, RETICCTPCT in the last 72 hours. Urine analysis:    Component Value Date/Time   COLORURINE AMBER (A) 08/17/2018 1624   APPEARANCEUR CLEAR 08/17/2018 1624   LABSPEC >1.046 (H) 08/17/2018 1624   PHURINE 5.0 08/17/2018 1624   GLUCOSEU NEGATIVE 08/17/2018 1624   HGBUR NEGATIVE 08/17/2018 1624   HGBUR negative 01/20/2009 0840   BILIRUBINUR NEGATIVE 08/17/2018 1624   BILIRUBINUR Small 01/30/2018 1652   KETONESUR NEGATIVE 08/17/2018 1624   PROTEINUR NEGATIVE 08/17/2018 1624   UROBILINOGEN 2.0 (A) 01/30/2018 1652   UROBILINOGEN 0.2 01/20/2009 0840   NITRITE NEGATIVE 08/17/2018 1624   LEUKOCYTESUR NEGATIVE 08/17/2018 1624   Sepsis Labs: @LABRCNTIP (procalcitonin:4,lacticidven:4)  )No results found for this or any previous visit (from the past 240 hour(s)).    Studies: Dg Abd 1 View  Result Date: 08/17/2018 CLINICAL DATA:  62 year old presenting with abdominal distention and absence of bowel movements over the past 5 days. Patient was recently released from the hospital after having a partial small bowel obstruction. Personal  history of lymphoma. EXAM: ABDOMEN - 1 VIEW COMPARISON:  08/11/2018, 08/01/2018 and earlier. Chest x-ray 07/30/2018. FINDINGS: AP ERECT image was obtained. Multiple markedly distended loops of small bowel throughout the upper abdomen demonstrating air-fluid levels. Distal small bowel of normal caliber. Normal caliber colon with moderate stool burden in the ascending colon. No evidence of free intraperitoneal air. Linear scarring in the visualized lung bases, unchanged. IMPRESSION: 1. Recurrent partial small bowel obstruction. 2. No free intraperitoneal air. These results will be called to the ordering clinician or representative by the Radiologist Assistant, and communication documented in the PACS or zVision Dashboard. Electronically Signed   By: Evangeline Dakin M.D.   On: 08/17/2018 15:19   Ct Abdomen Pelvis W Contrast  Result Date: 08/17/2018 CLINICAL DATA:  62 year old male with concern for bowel obstruction. History of Burkitt's lymphoma and chemotherapy. EXAM: CT ABDOMEN AND PELVIS WITH CONTRAST TECHNIQUE: Multidetector CT imaging of the abdomen and  pelvis was performed using the standard protocol following bolus administration of intravenous contrast. CONTRAST:  131m ISOVUE-300 IOPAMIDOL (ISOVUE-300) INJECTION 61%, 349mOMNIPAQUE IOHEXOL 300 MG/ML SOLN COMPARISON:  CT of the abdomen pelvis dated 07/30/2018 and abdominal radiograph dated 08/17/2018 FINDINGS: Lower chest: There are bibasilar linear atelectasis/scarring. The tip of a central venous catheter is partially visualized at the cavoatrial junction. No intra-abdominal free air.  Trace fluid adjacent to the liver. Hepatobiliary: Subcentimeter hypodense focus in the dome of the liver is too small to characterize. The liver is otherwise unremarkable. No intrahepatic biliary ductal dilatation. There is a focal area of thickening at the gallbladder fundus most consistent with adenomyomatosis. This was present on the prior studies and was not  metabolically active on the PET. No calcified gallstone. Pancreas: Unremarkable. No pancreatic ductal dilatation or surrounding inflammatory changes. Spleen: Normal in size without focal abnormality. Adrenals/Urinary Tract: The adrenal glands are unremarkable. There is a 5 mm nonobstructing calculus in the interpolar aspect of the right kidney. No hydronephrosis. Additional smaller nonobstructing stone noted in the superior pole of the right kidney. Subcentimeter bilateral renal hypodense lesions are not characterized but likely represent cysts. There is no hydronephrosis on either side. There is symmetric enhancement and excretion of contrast by both kidneys. The urinary bladder is grossly unremarkable. Stomach/Bowel: There is segmental thickening of the sigmoid colon involving approximately 8 cm length. There is associated luminal narrowing and stricture of the sigmoid colon with moderate amount of retained stool within the colon proximal to this segment with an appearance of a moderate to high-grade obstruction. The thickened segment of the sigmoid colon may be related to post treatment changes and resulting fibrosis although involvement with a neoplastic process is not excluded. This is however somewhat new since PET-CT of 06/09/2018. There is stranding of the fat in the pelvis adjacent to the sigmoid colon with tethering of the terminal ileum and appendix consistent with adhesions and scarring. There may be a degree of low-grade or partial small-bowel obstruction at the focus of tethering of the terminal ileum. There is fecalization of the terminal ileum. However, fecal content passes through the colon. The small bowel are dilated measuring up to 6 cm. The appendix is thickened and dilated with fecal content. It measures up to 2.5 cm in diameter. Multiple appendicular with noted in the distal appendix. The changes of the appendix are likely secondary to post radiation treatment. However, acute appendicitis is  not entirely excluded. Clinical correlation is recommended. Vascular/Lymphatic: The abdominal aorta and IVC are grossly unremarkable. No portal venous gas. No adenopathy. Reproductive: The prostate and seminal vesicles are grossly unremarkable. Other: None Musculoskeletal: Degenerative changes of the spine. No acute osseous pathology. IMPRESSION: 1. Segmental thickening of the sigmoid colon with associated luminal narrowing and stricture and resulting obstruction at the level of the sigmoid colon. Findings likely related to post treatment changes although a colonic mass is not entirely excluded. 2. Diffusely dilated small bowel may represent an ileus or a degree of small-bowel obstruction at the level of the terminal ileum secondary to adhesions. Stool however passes into the colon. A small-bowel series may provide better evaluation of the degree of potential obstruction in the terminal ileum. 3. Dilated and thickened appendix, likely related to post treatment changes. Acute appendicitis is not entirely excluded. Clinical correlation is recommended. Electronically Signed   By: ArAnner Crete.D.   On: 08/17/2018 21:43   Dg Abd Portable 1 View  Result Date: 08/17/2018 CLINICAL DATA:  [age]ear old male status  post NG tube placement. EXAM: PORTABLE ABDOMEN - 1 VIEW COMPARISON:  CT of the abdomen pelvis dated 08/17/2018 FINDINGS: An enteric tube is partially visualized with side-port in the gastric area and tip in the left upper abdomen likely in the proximal stomach. Dilated loops of small and large bowel noted in the visualized abdomen. IMPRESSION: Enteric tube with tip in the proximal stomach. Electronically Signed   By: Anner Crete M.D.   On: 08/17/2018 22:49    Scheduled Meds: . insulin aspart  0-9 Units Subcutaneous Q4H    Continuous Infusions: . sodium chloride 100 mL/hr at 08/18/18 0247  . heparin    . piperacillin-tazobactam (ZOSYN)  IV 3.375 g (08/18/18 0945)     LOS: 1 day      Alma Friendly, MD [facility]  If 7PM-7AM, please contact night-coverage [email or web address] 08/18/2018, 12:00 PM

## 2018-08-18 NOTE — ED Notes (Signed)
Hospitalist at bedside 

## 2018-08-18 NOTE — ED Notes (Signed)
Pt and family requesting to hold the enema until they get in a private room if possible. This RN will call bed placement to check status of bed assignment

## 2018-08-18 NOTE — ED Notes (Signed)
ED TO INPATIENT HANDOFF REPORT  Name/Age/Gender Mark Frey 62 y.o. male  Code Status    Code Status Orders  (From admission, onward)         Start     Ordered   08/18/18 0143  Full code  Continuous     08/18/18 0146        Code Status History    Date Active Date Inactive Code Status Order ID Comments User Context   07/30/2018 2133 08/04/2018 1735 Full Code [id number]  Mark Patience, MD Inpatient   07/21/2018 1458 07/24/2018 1551 Full Code [id number]  Mark Coss, MD ED   07/03/2018 1053 07/07/2018 1645 Full Code [id number]  Mark Genera, MD Inpatient   06/12/2018 1113 06/16/2018 1542 Full Code [id number]  Mark Genera, MD Inpatient   05/22/2018 1036 05/26/2018 1644 Full Code [id number]  Mark Genera, MD Inpatient   05/01/2018 0959 05/05/2018 2317 Full Code [id number]  Mark Genera, MD Inpatient   04/10/2018 1604 04/14/2018 1640 Full Code [id number]  Mark Genera, MD Inpatient   03/10/2018 2125 03/25/2018 2126 Full Code [id number]  Mark Baker, MD Inpatient      Home/SNF/Other Home  Chief Complaint chemo pt / nasuea / constipation   Level of Care/Admitting Diagnosis ED Disposition    ED Disposition Condition Comment   [facility]: [place] [[id number]]  Level of Care: Telemetry [5]  Admit to tele based on following criteria: Monitor for Ischemic changes  Diagnosis: SBO (small bowel obstruction) [facility]) [[id number]]  Admitting Physician: Mark Frey [phone]  Attending Physician: Mark Frey [phone]  Estimated length of stay: past midnight tomorrow  Certification:: I certify this patient will need inpatient services for at least 2 midnights  PT Class (Do Not Modify): Inpatient [101]  PT Acc Code (Do Not Modify): Private [1]       Medical History Past Medical History:  Diagnosis Date  . ALLERGIC RHINITIS   . Cancer ([place])    Lymphoma   . Diabetes mellitus   . Hyperlipidemia      Allergies Allergies  Allergen Reactions  . Ciprofloxacin Other (See Comments)    Leg tingling    IV Location/Drains/Wounds Patient Lines/Drains/Airways Status   Active Line/Drains/Airways    Name:   Placement date:   Placement time:   Site:   Days:   Implanted Port 03/27/18   03/27/18    1141    -   144   Peripheral IV 07/31/18 Right Hand   07/31/18    0117    Hand   18          Labs/Imaging Results for orders placed or performed during the hospital encounter of 08/17/18 (from the past 48 hour(s))  Urinalysis, Routine w reflex microscopic     Status: Abnormal   Collection Time: 08/17/18  4:24 PM  Result Value Ref Range   Color, Urine AMBER (A) YELLOW    Comment: BIOCHEMICALS MAY BE AFFECTED BY COLOR   APPearance CLEAR CLEAR   Specific Gravity, Urine >1.046 (H) 1.005 - 1.030   pH 5.0 5.0 - 8.0   Glucose, UA NEGATIVE NEGATIVE mg/dL   Hgb urine dipstick NEGATIVE NEGATIVE   Bilirubin Urine NEGATIVE NEGATIVE   Ketones, ur NEGATIVE NEGATIVE mg/dL   Protein, ur NEGATIVE NEGATIVE mg/dL   Nitrite NEGATIVE NEGATIVE   Leukocytes, UA NEGATIVE NEGATIVE    Comment: Performed at [facility], [place] [address] [place],  [address]  Comprehensive metabolic panel     Status: Abnormal   Collection Time: 08/17/18  5:38 PM  Result Value Ref Range   Sodium 136 135 - 145 mmol/L   Potassium 4.3 3.5 - 5.1 mmol/L   Chloride 97 (L) 98 - 111 mmol/L   CO2 29 22 - 32 mmol/L   Glucose, Bld 113 (H) 70 - 99 mg/dL   BUN 13 8 - 23 mg/dL   Creatinine, Ser 0.64 0.61 - 1.24 mg/dL   Calcium 8.7 (L) 8.9 - 10.3 mg/dL   Total Protein 5.7 (L) 6.5 - 8.1 g/dL   Albumin 2.7 (L) 3.5 - 5.0 g/dL   AST 13 (L) 15 - 41 U/L   ALT 13 0 - 44 U/L   Alkaline Phosphatase 56 38 - 126 U/L   Total Bilirubin 0.5 0.3 - 1.2 mg/dL   GFR calc non Af Amer >60 >60 mL/min   GFR calc Af Amer >60 >60 mL/min   Anion gap 10 5 - 15    Comment: Performed at [facility], [place] [address], [place], [place] [address]  Lipase, blood     Status: None   Collection Time: 08/17/18  5:38 PM  Result Value Ref Range   Lipase 19 11 - 51 U/L    Comment: Performed at [facility], [place] [address], [place], [place] [address]  CBC with Differential     Status: Abnormal   Collection Time: 08/17/18  5:38 PM  Result Value Ref Range   WBC 7.2 4.0 - 10.5 K/uL   RBC 3.22 (L) 4.22 - 5.81 MIL/uL   Hemoglobin 9.6 (L) 13.0 - 17.0 g/dL   HCT 31.7 (L) 39.0 - 52.0 %   MCV 98.4 80.0 - 100.0 fL   MCH 29.8 26.0 - 34.0 pg   MCHC 30.3 30.0 - 36.0 g/dL   RDW 18.6 (H) 11.5 - 15.5 %   Platelets 214 150 - 400 K/uL   nRBC 0.0 0.0 - 0.2 %   Neutrophils Relative % 57 %   Neutro Abs 4.1 1.7 - 7.7 K/uL   Lymphocytes Relative 36 %   Lymphs Abs 2.6 0.7 - 4.0 K/uL   Monocytes Relative 7 %   Monocytes Absolute 0.5 0.1 - 1.0 K/uL   Eosinophils Relative 0 %   Eosinophils Absolute 0.0 0.0 - 0.5 K/uL   Basophils Relative 0 %   Basophils Absolute 0.0 0.0 - 0.1 K/uL   Immature Granulocytes 0 %   Abs Immature Granulocytes 0.03 0.00 - 0.07 K/uL    Comment: Performed at [facility], [place] [address], [place], [place] [address]  CBG monitoring, ED     Status: Abnormal   Collection Time: 08/18/18  2:28 AM  Result Value Ref Range   Glucose-Capillary 110 (H) 70 - 99 mg/dL   Dg Abd 1 View  Result Date: 08/17/2018 CLINICAL DATA:  62 year old presenting with abdominal distention and absence of bowel movements over the past 5 days. Patient was recently released from the hospital after having a partial small bowel obstruction. Personal history of lymphoma. EXAM: ABDOMEN - 1 VIEW COMPARISON:  08/11/2018, 08/01/2018 and earlier. Chest x-ray 07/30/2018. FINDINGS: AP ERECT image was obtained. Multiple markedly distended loops of small bowel throughout the upper abdomen demonstrating air-fluid levels. Distal small bowel of normal caliber. Normal caliber colon with moderate stool burden in the  ascending colon. No evidence of free intraperitoneal air. Linear scarring in the visualized lung bases, unchanged. IMPRESSION: 1. Recurrent  partial small bowel obstruction. 2. No free intraperitoneal air. These results will be called to the ordering clinician or representative by the Radiologist Assistant, and communication documented in the PACS or zVision Dashboard. Electronically Signed   By: Evangeline Dakin M.D.   On: 08/17/2018 15:19   Ct Abdomen Pelvis W Contrast  Result Date: 08/17/2018 CLINICAL DATA:  62 year old male with concern for bowel obstruction. History of Burkitt's lymphoma and chemotherapy. EXAM: CT ABDOMEN AND PELVIS WITH CONTRAST TECHNIQUE: Multidetector CT imaging of the abdomen and pelvis was performed using the standard protocol following bolus administration of intravenous contrast. CONTRAST:  154m ISOVUE-300 IOPAMIDOL (ISOVUE-300) INJECTION 61%, 330mOMNIPAQUE IOHEXOL 300 MG/ML SOLN COMPARISON:  CT of the abdomen pelvis dated 07/30/2018 and abdominal radiograph dated 08/17/2018 FINDINGS: Lower chest: There are bibasilar linear atelectasis/scarring. The tip of a central venous catheter is partially visualized at the cavoatrial junction. No intra-abdominal free air.  Trace fluid adjacent to the liver. Hepatobiliary: Subcentimeter hypodense focus in the dome of the liver is too small to characterize. The liver is otherwise unremarkable. No intrahepatic biliary ductal dilatation. There is a focal area of thickening at the gallbladder fundus most consistent with adenomyomatosis. This was present on the prior studies and was not metabolically active on the PET. No calcified gallstone. Pancreas: Unremarkable. No pancreatic ductal dilatation or surrounding inflammatory changes. Spleen: Normal in size without focal abnormality. Adrenals/Urinary Tract: The adrenal glands are unremarkable. There is a 5 mm nonobstructing calculus in the interpolar aspect of the right kidney. No hydronephrosis.  Additional smaller nonobstructing stone noted in the superior pole of the right kidney. Subcentimeter bilateral renal hypodense lesions are not characterized but likely represent cysts. There is no hydronephrosis on either side. There is symmetric enhancement and excretion of contrast by both kidneys. The urinary bladder is grossly unremarkable. Stomach/Bowel: There is segmental thickening of the sigmoid colon involving approximately 8 cm length. There is associated luminal narrowing and stricture of the sigmoid colon with moderate amount of retained stool within the colon proximal to this segment with an appearance of a moderate to high-grade obstruction. The thickened segment of the sigmoid colon may be related to post treatment changes and resulting fibrosis although involvement with a neoplastic process is not excluded. This is however somewhat new since PET-CT of 06/09/2018. There is stranding of the fat in the pelvis adjacent to the sigmoid colon with tethering of the terminal ileum and appendix consistent with adhesions and scarring. There may be a degree of low-grade or partial small-bowel obstruction at the focus of tethering of the terminal ileum. There is fecalization of the terminal ileum. However, fecal content passes through the colon. The small bowel are dilated measuring up to 6 cm. The appendix is thickened and dilated with fecal content. It measures up to 2.5 cm in diameter. Multiple appendicular with noted in the distal appendix. The changes of the appendix are likely secondary to post radiation treatment. However, acute appendicitis is not entirely excluded. Clinical correlation is recommended. Vascular/Lymphatic: The abdominal aorta and IVC are grossly unremarkable. No portal venous gas. No adenopathy. Reproductive: The prostate and seminal vesicles are grossly unremarkable. Other: None Musculoskeletal: Degenerative changes of the spine. No acute osseous pathology. IMPRESSION: 1. Segmental  thickening of the sigmoid colon with associated luminal narrowing and stricture and resulting obstruction at the level of the sigmoid colon. Findings likely related to post treatment changes although a colonic mass is not entirely excluded. 2. Diffusely dilated small bowel may represent an ileus or a  degree of small-bowel obstruction at the level of the terminal ileum secondary to adhesions. Stool however passes into the colon. A small-bowel series may provide better evaluation of the degree of potential obstruction in the terminal ileum. 3. Dilated and thickened appendix, likely related to post treatment changes. Acute appendicitis is not entirely excluded. Clinical correlation is recommended. Electronically Signed   By: Anner Crete M.D.   On: 08/17/2018 21:43   Dg Abd Portable 1 View  Result Date: 08/17/2018 CLINICAL DATA:  62 year old male status post NG tube placement. EXAM: PORTABLE ABDOMEN - 1 VIEW COMPARISON:  CT of the abdomen pelvis dated 08/17/2018 FINDINGS: An enteric tube is partially visualized with side-port in the gastric area and tip in the left upper abdomen likely in the proximal stomach. Dilated loops of small and large bowel noted in the visualized abdomen. IMPRESSION: Enteric tube with tip in the proximal stomach. Electronically Signed   By: Anner Crete M.D.   On: 08/17/2018 22:49   None  Pending Labs Unresulted Labs (From admission, onward)    Start     Ordered   08/19/18 0500  CBC  Daily,   R     08/18/18 0202   08/18/18 1100  Heparin level (unfractionated)  Once-Timed,   STAT     08/18/18 0201   08/18/18 1100  APTT  Once,   R     08/18/18 0201   08/18/18 0500  Comprehensive metabolic panel  Tomorrow morning,   R     08/18/18 0146   08/18/18 0500  CBC WITH DIFFERENTIAL  Tomorrow morning,   R     08/18/18 0146          Vitals/Pain Today's Vitals   08/17/18 2000 08/17/18 2115 08/18/18 0200 08/18/18 0230  BP:   114/88 122/76  Pulse: (!) 104 (!) 109  (!) 104   Resp: 14 20 14 14   Temp:      TempSrc:      SpO2: 94% 94%  93%  Weight:      Height:      PainSc:        Isolation Precautions No active isolations  Medications Medications  iopamidol (ISOVUE-300) 61 % injection (has no administration in time range)  acetaminophen (TYLENOL) tablet 650 mg (has no administration in time range)    Or  acetaminophen (TYLENOL) suppository 650 mg (has no administration in time range)  ondansetron (ZOFRAN) tablet 4 mg (has no administration in time range)    Or  ondansetron (ZOFRAN) injection 4 mg (has no administration in time range)  insulin aspart (novoLOG) injection 0-9 Units (0 Units Subcutaneous Not Given 08/18/18 0231)  0.9 %  sodium chloride infusion ( Intravenous New Bag/Given 08/18/18 0247)  morphine 2 MG/ML injection 1 mg (has no administration in time range)  heparin ADULT infusion 100 units/mL (25000 units/220m sodium chloride 0.45%) (has no administration in time range)  sodium chloride 0.9 % bolus 500 mL (0 mLs Intravenous Stopped 08/17/18 2300)  iohexol (OMNIPAQUE) 300 MG/ML solution 30 mL (30 mLs Oral Contrast Given 08/17/18 1845)  iopamidol (ISOVUE-300) 61 % injection 100 mL (100 mLs Intravenous Contrast Given 08/17/18 2055)  piperacillin-tazobactam (ZOSYN) IVPB 3.375 g (0 g Intravenous Stopped 08/18/18 0203)    Mobility walks with device

## 2018-08-18 NOTE — Progress Notes (Addendum)
Patient has history of ST. Alert and oriented x4. Vital signs was taken. NGT placed. . RN 5E got report from 1339.Patient was transferred from ED at 1427 due to transportation issue.

## 2018-08-18 NOTE — ED Notes (Signed)
Per pharmacy: Heparin and zosyn are compatible through port

## 2018-08-18 NOTE — ED Notes (Signed)
ED TO INPATIENT HANDOFF REPORT  Name/Age/Gender Mark Frey 62 y.o. male  Code Status    Code Status Orders  (From admission, onward)         Start     Ordered   08/18/18 0143  Full code  Continuous     08/18/18 0146        Code Status History    Date Active Date Inactive Code Status Order ID Comments User Context   07/30/2018 2133 08/04/2018 1735 Full Code [id number]  [name], MD Inpatient   07/21/2018 1458 07/24/2018 1551 Full Code [id number]  [name], MD ED   07/03/2018 1053 07/07/2018 1645 Full Code [id number]  [name], MD Inpatient   06/12/2018 1113 06/16/2018 1542 Full Code [id number]  [name], MD Inpatient   05/22/2018 1036 05/26/2018 1644 Full Code [id number]  [name], MD Inpatient   05/01/2018 0959 05/05/2018 2317 Full Code [id number]  [name], MD Inpatient   04/10/2018 1604 04/14/2018 1640 Full Code [id number]  [name], MD Inpatient   03/10/2018 2125 03/25/2018 2126 Full Code [id number]  [name], MD Inpatient      Home/SNF/Other Home  Chief Complaint chemo pt  nasuea  constipation   Level of Care/Admitting Diagnosis ED Disposition    ED Disposition Condition Comment   Mark Frey: Mark Frey [[id number]]  Level of Care: Telemetry [5]  Admit to tele based on following criteria: Monitor for Ischemic changes  Diagnosis: SBO (small bowel obstruction) Mark Frey) [[id number]]  Admitting Physician: [name] [phone]  Attending Physician: [name] [phone]  Estimated length of stay: past midnight tomorrow  Certification:: I certify this patient will need inpatient services for at least 2 midnights  PT Class (Do Not Modify): Inpatient [101]  PT Acc Code (Do Not Modify): Private [1]       Medical History Past Medical History:  Diagnosis Date  . ALLERGIC RHINITIS   . Cancer ([place])    Lymphoma   . Diabetes mellitus   . Hyperlipidemia      Allergies Allergies  Allergen Reactions  . Ciprofloxacin Other (See Comments)    Leg tingling    IV Location/Drains/Wounds Patient Lines/Drains/Airways Status   Active Line/Drains/Airways    Name:   Placement date:   Placement time:   Site:   Days:   Implanted Port 03/27/18   03/27/18    1141    -   144          Labs/Imaging Results for orders placed or performed during the hospital encounter of 08/17/18 (from the past 48 hour(s))  Urinalysis, Routine w reflex microscopic     Status: Abnormal   Collection Time: 08/17/18  4:24 PM  Result Value Ref Range   Color, Urine AMBER (A) YELLOW    Comment: BIOCHEMICALS MAY BE AFFECTED BY COLOR   APPearance CLEAR CLEAR   Specific Gravity, Urine >1.046 (H) 1.005 - 1.030   pH 5.0 5.0 - 8.0   Glucose, UA NEGATIVE NEGATIVE mg/dL   Hgb urine dipstick NEGATIVE NEGATIVE   Bilirubin Urine NEGATIVE NEGATIVE   Ketones, ur NEGATIVE NEGATIVE mg/dL   Protein, ur NEGATIVE NEGATIVE mg/dL   Nitrite NEGATIVE NEGATIVE   Leukocytes, UA NEGATIVE NEGATIVE    Comment: Performed at [facility], [place] [address] [place], [place] [address]  Comprehensive metabolic panel     Status: Abnormal   Collection Time: 08/17/18  5:38 PM  Result Value Ref  Range   Sodium 136 135 - 145 mmol/L   Potassium 4.3 3.5 - 5.1 mmol/L   Chloride 97 (L) 98 - 111 mmol/L   CO2 29 22 - 32 mmol/L   Glucose, Bld 113 (H) 70 - 99 mg/dL   BUN 13 8 - 23 mg/dL   Creatinine, Ser 0.64 0.61 - 1.24 mg/dL   Calcium 8.7 (L) 8.9 - 10.3 mg/dL   Total Protein 5.7 (L) 6.5 - 8.1 g/dL   Albumin 2.7 (L) 3.5 - 5.0 g/dL   AST 13 (L) 15 - 41 U/L   ALT 13 0 - 44 U/L   Alkaline Phosphatase 56 38 - 126 U/L   Total Bilirubin 0.5 0.3 - 1.2 mg/dL   GFR calc non Af Amer >60 >60 mL/min   GFR calc Af Amer >60 >60 mL/min   Anion gap 10 5 - 15    Comment: Performed at [facility], [place] [address], [place], [place] [address]  Lipase, blood     Status: None    Collection Time: 08/17/18  5:38 PM  Result Value Ref Range   Lipase 19 11 - 51 U/L    Comment: Performed at [facility], [place] [address] [place], [place] [address]  CBC with Differential     Status: Abnormal   Collection Time: 08/17/18  5:38 PM  Result Value Ref Range   WBC 7.2 4.0 - 10.5 K/uL   RBC 3.22 (L) 4.22 - 5.81 MIL/uL   Hemoglobin 9.6 (L) 13.0 - 17.0 g/dL   HCT 31.7 (L) 39.0 - 52.0 %   MCV 98.4 80.0 - 100.0 fL   MCH 29.8 26.0 - 34.0 pg   MCHC 30.3 30.0 - 36.0 g/dL   RDW 18.6 (H) 11.5 - 15.5 %   Platelets 214 150 - 400 K/uL   nRBC 0.0 0.0 - 0.2 %   Neutrophils Relative % 57 %   Neutro Abs 4.1 1.7 - 7.7 K/uL   Lymphocytes Relative 36 %   Lymphs Abs 2.6 0.7 - 4.0 K/uL   Monocytes Relative 7 %   Monocytes Absolute 0.5 0.1 - 1.0 K/uL   Eosinophils Relative 0 %   Eosinophils Absolute 0.0 0.0 - 0.5 K/uL   Basophils Relative 0 %   Basophils Absolute 0.0 0.0 - 0.1 K/uL   Immature Granulocytes 0 %   Abs Immature Granulocytes 0.03 0.00 - 0.07 K/uL    Comment: Performed at [facility],  [place] [address] [place],  [address]  CBG monitoring, ED     Status: Abnormal   Collection Time: 08/18/18  2:28 AM  Result Value Ref Range   Glucose-Capillary 110 (H) 70 - 99 mg/dL  CBG monitoring, ED     Status: None   Collection Time: 08/18/18  4:16 AM  Result Value Ref Range   Glucose-Capillary 94 70 - 99 mg/dL   Comment 1 Notify RN   Comprehensive metabolic panel     Status: Abnormal   Collection Time: 08/18/18  5:10 AM  Result Value Ref Range   Sodium 136 135 - 145 mmol/L   Potassium 3.8 3.5 - 5.1 mmol/L   Chloride 101 98 - 111 mmol/L   CO2 26 22 - 32 mmol/L   Glucose, Bld 102 (H) 70 - 99 mg/dL   BUN 12 8 - 23 mg/dL   Creatinine, Ser 0.54 (L) 0.61 - 1.24 mg/dL   Calcium 8.3 (L) 8.9 - 10.3 mg/dL   Total Protein 5.0 (L)  6.5 - 8.1 g/dL   Albumin 2.7 (L) 3.5 - 5.0 g/dL   AST 11 (L) 15 - 41 U/L   ALT 10 0 - 44 U/L   Alkaline Phosphatase 47  38 - 126 U/L   Total Bilirubin 0.4 0.3 - 1.2 mg/dL   GFR calc non Af Amer >60 >60 mL/min   GFR calc Af Amer >60 >60 mL/min   Anion gap 9 5 - 15    Comment: Performed at Mark [facility]rey, [place] [address] [place], [place] [address]  CBC WITH DIFFERENTIAL     Status: Abnormal   Collection Time: 08/18/18  5:10 AM  Result Value Ref Range   WBC 5.1 4.0 - 10.5 K/uL   RBC 2.91 (L) 4.22 - 5.81 MIL/uL   Hemoglobin 8.6 (L) 13.0 - 17.0 g/dL   HCT 29.1 (L) 39.0 - 52.0 %   MCV 100.0 80.0 - 100.0 fL   MCH 29.6 26.0 - 34.0 pg   MCHC 29.6 (L) 30.0 - 36.0 g/dL   RDW 18.6 (H) 11.5 - 15.5 %   Platelets 203 150 - 400 K/uL   nRBC 0.0 0.0 - 0.2 %   Neutrophils Relative % 55 %   Neutro Abs 2.8 1.7 - 7.7 K/uL   Lymphocytes Relative 37 %   Lymphs Abs 1.9 0.7 - 4.0 K/uL   Monocytes Relative 8 %   Monocytes Absolute 0.4 0.1 - 1.0 K/uL   Eosinophils Relative 0 %   Eosinophils Absolute 0.0 0.0 - 0.5 K/uL   Basophils Relative 0 %   Basophils Absolute 0.0 0.0 - 0.1 K/uL   Immature Granulocytes 0 %   Abs Immature Granulocytes 0.02 0.00 - 0.07 K/uL    Comment: Performed at [facility], [place] [address] [place], [place] [address]  CBG monitoring, ED     Status: None   Collection Time: 08/18/18  8:28 AM  Result Value Ref Range   Glucose-Capillary 98 70 - 99 mg/dL  Heparin level (unfractionated)     Status: Abnormal   Collection Time: 08/18/18 11:31 AM  Result Value Ref Range   Heparin Unfractionated 1.09 (H) 0.30 - 0.70 IU/mL    Comment: (NOTE) If heparin results are below expected values, and patient dosage has  been confirmed, suggest follow up testing of antithrombin III levels. Performed at [facility], [place] [address], [place], [place] [address]   APTT     Status: Abnormal   Collection Time: 08/18/18 11:31 AM  Result Value Ref Range   aPTT 45 (H) 24 - 36 seconds    Comment:        IF BASELINE aPTT IS ELEVATED, SUGGEST PATIENT RISK  ASSESSMENT BE USED TO DETERMINE APPROPRIATE ANTICOAGULANT THERAPY. Performed at [facility], [place] [address], [place], [place] [address]    Dg Abd 1 View  Result Date: 08/17/2018 CLINICAL DATA:  62 year old presenting with abdominal distention and absence of bowel movements over the past 5 days. Patient was recently released from the hospital after having a partial small bowel obstruction. Personal history of lymphoma. EXAM: ABDOMEN - 1 VIEW COMPARISON:  08/11/2018, 08/01/2018 and earlier. Chest x-ray 07/30/2018. FINDINGS: AP ERECT image was obtained. Multiple markedly distended loops of small bowel throughout the upper abdomen demonstrating air-fluid levels. Distal small bowel of normal caliber. Normal caliber colon with moderate stool burden in the ascending colon. No evidence of free intraperitoneal air. Linear scarring in the visualized lung bases, unchanged. IMPRESSION: 1. Recurrent partial  small bowel obstruction. 2. No free intraperitoneal air. These results will be called to the ordering clinician or representative by the Radiologist Assistant, and communication documented in the PACS or zVision Dashboard. Electronically Signed   By: Evangeline Dakin M.D.   On: 08/17/2018 15:19   Ct Abdomen Pelvis W Contrast  Result Date: 08/17/2018 CLINICAL DATA:  62 year old male with concern for bowel obstruction. History of Burkitt's lymphoma and chemotherapy. EXAM: CT ABDOMEN AND PELVIS WITH CONTRAST TECHNIQUE: Multidetector CT imaging of the abdomen and pelvis was performed using the standard protocol following bolus administration of intravenous contrast. CONTRAST:  146m ISOVUE-300 IOPAMIDOL (ISOVUE-300) INJECTION 61%, 368mOMNIPAQUE IOHEXOL 300 MG/ML SOLN COMPARISON:  CT of the abdomen pelvis dated 07/30/2018 and abdominal radiograph dated 08/17/2018 FINDINGS: Lower chest: There are bibasilar linear atelectasis/scarring. The tip of a central venous catheter is partially visualized at  the cavoatrial junction. No intra-abdominal free air.  Trace fluid adjacent to the liver. Hepatobiliary: Subcentimeter hypodense focus in the dome of the liver is too small to characterize. The liver is otherwise unremarkable. No intrahepatic biliary ductal dilatation. There is a focal Frey of thickening at the gallbladder fundus most consistent with adenomyomatosis. This was present on the prior studies and was not metabolically active on the PET. No calcified gallstone. Pancreas: Unremarkable. No pancreatic ductal dilatation or surrounding inflammatory changes. Spleen: Normal in size without focal abnormality. Adrenals/Urinary Tract: The adrenal glands are unremarkable. There is a 5 mm nonobstructing calculus in the interpolar aspect of the right kidney. No hydronephrosis. Additional smaller nonobstructing stone noted in the superior pole of the right kidney. Subcentimeter bilateral renal hypodense lesions are not characterized but likely represent cysts. There is no hydronephrosis on either side. There is symmetric enhancement and excretion of contrast by both kidneys. The urinary bladder is grossly unremarkable. Stomach/Bowel: There is segmental thickening of the sigmoid colon involving approximately 8 cm length. There is associated luminal narrowing and stricture of the sigmoid colon with moderate amount of retained stool within the colon proximal to this segment with an appearance of a moderate to high-grade obstruction. The thickened segment of the sigmoid colon may be related to post treatment changes and resulting fibrosis although involvement with a neoplastic process is not excluded. This is however somewhat new since PET-CT of 06/09/2018. There is stranding of the fat in the pelvis adjacent to the sigmoid colon with tethering of the terminal ileum and appendix consistent with adhesions and scarring. There may be a degree of low-grade or partial small-bowel obstruction at the focus of tethering of the  terminal ileum. There is fecalization of the terminal ileum. However, fecal content passes through the colon. The small bowel are dilated measuring up to 6 cm. The appendix is thickened and dilated with fecal content. It measures up to 2.5 cm in diameter. Multiple appendicular with noted in the distal appendix. The changes of the appendix are likely secondary to post radiation treatment. However, acute appendicitis is not entirely excluded. Clinical correlation is recommended. Vascular/Lymphatic: The abdominal aorta and IVC are grossly unremarkable. No portal venous gas. No adenopathy. Reproductive: The prostate and seminal vesicles are grossly unremarkable. Other: None Musculoskeletal: Degenerative changes of the spine. No acute osseous pathology. IMPRESSION: 1. Segmental thickening of the sigmoid colon with associated luminal narrowing and stricture and resulting obstruction at the level of the sigmoid colon. Findings likely related to post treatment changes although a colonic mass is not entirely excluded. 2. Diffusely dilated small bowel may represent an ileus or a degree  of small-bowel obstruction at the level of the terminal ileum secondary to adhesions. Stool however passes into the colon. A small-bowel series may provide better evaluation of the degree of potential obstruction in the terminal ileum. 3. Dilated and thickened appendix, likely related to post treatment changes. Acute appendicitis is not entirely excluded. Clinical correlation is recommended. Electronically Signed   By: Anner Crete M.D.   On: 08/17/2018 21:43   Dg Abd Portable 1 View  Result Date: 08/17/2018 CLINICAL DATA:  62 year old male status post NG tube placement. EXAM: PORTABLE ABDOMEN - 1 VIEW COMPARISON:  CT of the abdomen pelvis dated 08/17/2018 FINDINGS: An enteric tube is partially visualized with side-port in the gastric Frey and tip in the left upper abdomen likely in the proximal stomach. Dilated loops of small and large  bowel noted in the visualized abdomen. IMPRESSION: Enteric tube with tip in the proximal stomach. Electronically Signed   By: Anner Crete M.D.   On: 08/17/2018 22:49    Pending Labs Unresulted Labs (From admission, onward)    Start     Ordered   08/19/18 0500  CBC with Differential/Platelet  Daily,   R     08/18/18 1220   08/19/18 4665  Basic metabolic panel  Daily,   R     08/18/18 1220   08/18/18 1800  Heparin level (unfractionated)  Once-Timed,   R     08/18/18 1210   08/18/18 1800  APTT  Once-Timed,   R     08/18/18 1210          Vitals/Pain Today's Vitals   08/18/18 0930 08/18/18 0945 08/18/18 1000 08/18/18 1330  BP: 124/80  129/89 130/85  Pulse: 99 96 (!) 105 (!) 101  Resp: 13 15 14 18   Temp:      TempSrc:      SpO2: 92% 97% 95% 96%  Weight:      Height:      PainSc:        Isolation Precautions No active isolations  Medications Medications  iopamidol (ISOVUE-300) 61 % injection (has no administration in time range)  acetaminophen (TYLENOL) tablet 650 mg (has no administration in time range)    Or  acetaminophen (TYLENOL) suppository 650 mg (has no administration in time range)  ondansetron (ZOFRAN) tablet 4 mg (has no administration in time range)    Or  ondansetron (ZOFRAN) injection 4 mg (has no administration in time range)  insulin aspart (novoLOG) injection 0-9 Units (0 Units Subcutaneous Not Given 08/18/18 0939)  0.9 %  sodium chloride infusion ( Intravenous Stopped 08/18/18 1304)  morphine 2 MG/ML injection 1 mg (has no administration in time range)  piperacillin-tazobactam (ZOSYN) IVPB 3.375 g (0 g Intravenous Stopped 08/18/18 1304)  heparin ADULT infusion 100 units/mL (25000 units/21m sodium chloride 0.45%) (1,200 Units/hr Intravenous Transfusing/Transfer 08/18/18 1348)  sodium chloride 0.9 % bolus 500 mL (0 mLs Intravenous Stopped 08/17/18 2300)  iohexol (OMNIPAQUE) 300 MG/ML solution 30 mL (30 mLs Oral Contrast Given 08/17/18 1845)  iopamidol  (ISOVUE-300) 61 % injection 100 mL (100 mLs Intravenous Contrast Given 08/17/18 2055)  piperacillin-tazobactam (ZOSYN) IVPB 3.375 g (0 g Intravenous Stopped 08/18/18 0203)    Mobility walks with device

## 2018-08-18 NOTE — Progress Notes (Signed)
3 enemas were given. Patient did not have any bowel movement.

## 2018-08-18 NOTE — Progress Notes (Signed)
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 Ciprofloxacin Other (See Comments)    Leg tingling    Patient Measurements: Height: 5' 9"  (175.3 cm) Weight: 199 lb (90.3 kg) IBW/kg (Calculated) : 70.7 Heparin Dosing Weight:   Vital Signs: Temp: 99 F (37.2 C) (12/05 1554) Temp Source: Oral (12/05 1554) BP: 108/82 (12/05 1830) Pulse Rate: 109 (12/05 2115)  Labs: Recent Labs    08/17/18 1738  HGB 9.6*  HCT 31.7*  PLT 214  CREATININE 0.64    Estimated Creatinine Clearance: 106.3 mL/min (by C-G formula based on SCr of 0.64 mg/dL).   Medical History: Past Medical History:  Diagnosis Date  . ALLERGIC RHINITIS   . Cancer ([place])    Lymphoma   . Diabetes mellitus   . Hyperlipidemia     Medications:  Infusions:  . sodium chloride    . heparin      Assessment: Patient with history of DVT on apixaban prior to admission.  MD wishes for heparin bridge.  Last dose of apixaban noted 12/4 at 1800. PTT ordered with Heparin level until both correlate due to possible drug-lab interaction between oral anticoagulant (rivaroxaban, edoxaban, or apixaban) and anti-Xa level (aka heparin level)   Goal of Therapy:  Heparin level 0.3-0.7 units/ml aPTT 66-102 seconds Monitor platelets by anticoagulation protocol: Yes   Plan:  Heparin drip at 1000 units/hr Recheck PTT/heparin level at [address], [place] 08/18/2018,2:07 AM

## 2018-08-18 NOTE — Progress Notes (Signed)
Initial Nutrition Assessment  DOCUMENTATION CODES:   Severe malnutrition in context of chronic illness  INTERVENTION:    Monitor for diet advancement/toleration  Ensure Enlive po TID, each supplement provides 350 kcal and 20 grams of protein once advanced   Provide MVI daily  NUTRITION DIAGNOSIS:   Severe Malnutrition related to chronic illness, cancer and cancer related treatments as evidenced by energy intake < or equal to 75% for > or equal to 1 month, severe fat depletion, percent weight loss, mild muscle depletion.   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 Recently admitted 11/17 for SBO and treated medically. Presents this admission with recurrent SBO, sigmoid stenosis, and intraabdominal B cell lymphoma.    12/6- NGT to suction   Pt able to confirm a decrease in appetite but unable to provide dietary recall stating "My wife could tell you more." Suspect it is hard for pt to have conversation as he just had NGT placed. Could not find wife in waiting room. Will attempt to obtain more history if possible.   Per cancer center RD, pt was following a liquid diet with very slow advancement after his SBO in Nov. RD provided pt with Ensure. Asked pt if he had drank any while at home, which he did not. RD diagnosed pt with severe malnutrition on 12/2. This continues.   Pt unable to provide UBW or quantify recent wt loss. Records indicate pt weighed 219 lb on 07/30/18 and 199 lb this admission (9.1% wt loss in one month, significant for time frame). Nutrition-Focused physical exam completed.  Note: pt has not had BM in 7 days.   Medications reviewed and include: NS @ 100 ml/hr Labs reviewed.    Diet Order:   Diet Order            Diet NPO time specified  Diet effective now              EDUCATION NEEDS:   Not appropriate for education at this time  Skin:  Skin Assessment: Reviewed RN Assessment  Last BM:  PTA- enema given  Height:   Ht Readings from Last 1 Encounters:  08/17/18 5' 9"  (1.753 m)    Weight:   Wt Readings from Last 1 Encounters:  08/17/18 90.3 kg    Ideal Body Weight:  72.7 kg  BMI:  Body mass index is 29.39 kg/m.  Estimated Nutritional Needs:   Kcal:  2400-2600 kcal  Protein:  115-130 grams  Fluid:  >/= 2.4 L/day   Mariana Single RD, LDN Clinical Nutrition Pager # - [phone]

## 2018-08-18 NOTE — Progress Notes (Addendum)
ANTICOAGULATION CONSULT NOTE - Follow Up Consult  Pharmacy Consult for heparin Indication: hx pulmonary embolus and DVT (home Eliquis on hold)  Allergies  Allergen Reactions  . Ciprofloxacin Other (See Comments)    Leg tingling    Patient Measurements: Height: 5' 9"  (175.3 cm) Weight: 199 lb (90.3 kg) IBW/kg (Calculated) : 70.7 Heparin Dosing Weight: 89 kg  Vital Signs: Temp: 98.2 F (36.8 C) (12/06 1412) Temp Source: Oral (12/06 1412) BP: 130/85 (12/06 1412) Pulse Rate: 101 (12/06 1500)  Labs: Recent Labs    08/17/18 1738 08/18/18 0510 08/18/18 1131 08/18/18 1803  HGB 9.6* 8.6*  --   --   HCT 31.7* 29.1*  --   --   PLT 214 203  --   --   APTT  --   --  45* 32  HEPARINUNFRC  --   --  1.09* 0.99*  CREATININE 0.64 0.54*  --   --     Estimated Creatinine Clearance: 106.3 mL/min (A) (by C-G formula based on SCr of 0.54 mg/dL (L)).   Medications:  - on Eliquis 5 mg bid PTA (last dose taken on 12/4 at 1800)  Assessment: Patient's a 62 y.o M with hx high-grade B-cell lymphoma with intra-abdominal mass encasing the small bowel and PE/DVT on Eliquis PTA, presented to the ED on 12/5 with c/o constipation and abd distention.  Abd CT on 12/5 showed bowel obstruction. Patient was transitioned from Eliquis to heparin drip on admission since patient is NPO and in case surgical intervention is needed.  Today, 08/18/2018: - second heparin level is elevated at 0.99 due to effects of Eliquis, aPTT is subtherapeutic at 32 after rate increased from 1000 to 1200 units/hr. RN says IV is infusing w/o issues at 12 ml/hr, no bleeding reported.  Will adjust heparin drip based on aPTT for now until heparin level and aPTT levels correlate - then will dose based on heparin level. -  Goal of Therapy:  Heparin level 0.3-0.7 units/ml aPTT 66-102 seconds Monitor platelets by anticoagulation protocol: Yes   Plan:  - bolus 2500 units IV x 1 - Increase heparin drip to 1500 units/hr - check aPTT  and heparin level with AM labs - monitor for s/s bleeding  Eudelia Bunch, Pharm.D [phone] 08/18/2018 7:20 PM

## 2018-08-18 NOTE — Progress Notes (Signed)
[name]   HEMATOLOGY/ONCOLOGY INPATIENT PROGRESS NOTE  Date of Service: 08/18/2018  Inpatient Attending: .[name], MD   SUBJECTIVE  Mark Frey is well known to me. Was recently seen in clinic and had improving ileus and obstructive symptomatology but has now been re-admitted with obstructive symptoms. Rpt CT abd/pelvis shows -Segmental thickening of the sigmoid colon with associated luminal narrowing and stricture and resulting obstruction at the level of the sigmoid colon. Findings likely related to post treatment changes although a colonic mass is not entirely excluded. NGT has been placed. No BMs in the hospital yet. Passing minimal gas. Has received 2 enemas no with no BMs yet. Seen by surgery and GI.  OBJECTIVE:  NAD  PHYSICAL EXAMINATION: . Vitals:   08/18/18 1000 08/18/18 1330 08/18/18 1412 08/18/18 1500  BP: 129/89 130/85 130/85   Pulse: (!) 105 (!) 101 (!) 104 (!) 101  Resp: 14 18 16    Temp:   98.2 F (36.8 C)   TempSrc:   Oral   SpO2: 95% 96% 97%   Weight:      Height:       Filed Weights   08/17/18 1555  Weight: 199 lb (90.3 kg)   .Body mass index is 29.39 kg/m.  [name] GENERAL:alert,NGT in situ SKIN: no acute rashes, no significant lesions EYES: conjunctiva are pink and non-injected, sclera anicteric OROPHARYNX: MMM NGT in situ NECK: supple, no JVD LYMPH:  no palpable lymphadenopathy in the cervical, axillary or inguinal regions LUNGS: clear to auscultation b/l with normal respiratory effort HEART: regular rate & rhythm ABDOMEN:  Mild abdominal distension with some TTP in the lower abdomen. No guarding/rigidity/rebound. Extremity: trace pedal edema PSYCH: alert & oriented x 3 with fluent speech NEURO: no focal motor/sensory deficits   MEDICAL HISTORY:  Past Medical History:  Diagnosis Date  . ALLERGIC RHINITIS   . Cancer ([place])    Lymphoma   . Diabetes mellitus   . Hyperlipidemia     SURGICAL HISTORY: Past Surgical History:  Procedure  Laterality Date  . BIOPSY  03/15/2018   Procedure: BIOPSY;  Surgeon: Milus Banister, MD;  Location: [facility];  Service: Endoscopy;;  . CLEFT PALATE REPAIR    . COLONOSCOPY N/A 03/15/2018   Procedure: COLONOSCOPY;  Surgeon: Milus Banister, MD;  Location: [facility];  Service: Endoscopy;  Laterality: N/A;  . deviated septum repair     slight improvement  . ESOPHAGOGASTRODUODENOSCOPY N/A 03/15/2018   Procedure: ESOPHAGOGASTRODUODENOSCOPY (EGD);  Surgeon: Milus Banister, MD;  Location: [name];  Service: Endoscopy;  Laterality: N/A;  . IR IMAGING GUIDED PORT INSERTION  03/17/2018  . TONSILLECTOMY      SOCIAL HISTORY: Social History   Socioeconomic History  . Marital status: Married    Spouse name: lisa  . Number of children: 0  . Years of education: Not on file  . Highest education level: Not on file  Occupational History  . Not on file  Social Needs  . Financial resource strain: Not hard at all  . Food insecurity:    Worry: Never true    Inability: Never true  . Transportation needs:    Medical: No    Non-medical: No  Tobacco Use  . Smoking status: Never Smoker  . Smokeless tobacco: Never Used  Substance and Sexual Activity  . Alcohol use: Yes    Comment: occasional  . Drug use: No  . Sexual activity: Yes  Lifestyle  . Physical activity:    Days per week: 0  days    Minutes per session: 0 min  . Stress: Not at all  Relationships  . Social connections:    Talks on phone: More than three times a week    Gets together: More than three times a week    Attends religious service: 1 to 4 times per year    Active member of club or organization: No    Attends meetings of clubs or organizations: Never    Relationship status: Married  . Intimate partner violence:    Fear of current or ex partner: No    Emotionally abused: No    Physically abused: No    Forced sexual activity: No  Other Topics Concern  . Not on file  Social History Narrative   Married 1985. No  kids. 4 small dogs.       Works in Financial trader, residential      Hobbies: work on cars, Haematologist, exercise as able    FAMILY HISTORY: Family History  Problem Relation Age of Onset  . Lung cancer Mother        smoker  . Brain cancer Mother        metastasis  . AAA (abdominal aortic aneurysm) Father        smoker    ALLERGIES:  is allergic to ciprofloxacin.  MEDICATIONS:  Scheduled Meds: . insulin aspart  0-9 Units Subcutaneous Q4H   Continuous Infusions: . sodium chloride 100 mL/hr at 08/18/18 1500  . heparin 1,200 Units/hr (08/18/18 1215)  . piperacillin-tazobactam (ZOSYN)  IV 3.375 g (08/18/18 1750)   PRN Meds:.acetaminophen **OR** acetaminophen, morphine injection, ondansetron **OR** ondansetron (ZOFRAN) IV  REVIEW OF SYSTEMS:    10 Point review of Systems was done is negative except as noted above.   LABORATORY DATA:  I have reviewed the data as listed  . CBC Latest Ref Rng & Units 08/18/2018 08/17/2018 08/11/2018  WBC 4.0 - 10.5 K/uL 5.1 7.2 6.9  Hemoglobin 13.0 - 17.0 g/dL 8.6(L) 9.6(L) 9.7(L)  Hematocrit 39.0 - 52.0 % 29.1(L) 31.7(L) 30.8(L)  Platelets 150 - 400 K/uL 203 214 205    . CMP Latest Ref Rng & Units 08/18/2018 08/17/2018 08/11/2018  Glucose 70 - 99 mg/dL 102(H) 113(H) 127(H)  BUN 8 - 23 mg/dL 12 13 7(L)  Creatinine 0.61 - 1.24 mg/dL 0.54(L) 0.64 0.60(L)  Sodium 135 - 145 mmol/L 136 136 134(L)  Potassium 3.5 - 5.1 mmol/L 3.8 4.3 3.5  Chloride 98 - 111 mmol/L 101 97(L) 100  CO2 22 - 32 mmol/L 26 29 23   Calcium 8.9 - 10.3 mg/dL 8.3(L) 8.7(L) 8.9  Total Protein 6.5 - 8.1 g/dL 5.0(L) 5.7(L) 5.8(L)  Total Bilirubin 0.3 - 1.2 mg/dL 0.4 0.5 0.4  Alkaline Phos 38 - 126 U/L 47 56 53  AST 15 - 41 U/L 11(L) 13(L) 20  ALT 0 - 44 U/L 10 13 16      RADIOGRAPHIC STUDIES: I have personally reviewed the radiological images as listed and agreed with the findings in the report. Dg Abd 1 View  Result Date: 08/17/2018 CLINICAL DATA:  62 year old  presenting with abdominal distention and absence of bowel movements over the past 5 days. Patient was recently released from the hospital after having a partial small bowel obstruction. Personal history of lymphoma. EXAM: ABDOMEN - 1 VIEW COMPARISON:  08/11/2018, 08/01/2018 and earlier. Chest x-ray 07/30/2018. FINDINGS: AP ERECT image was obtained. Multiple markedly distended loops of small bowel throughout the upper abdomen demonstrating air-fluid levels. Distal small bowel of normal  caliber. Normal caliber colon with moderate stool burden in the ascending colon. No evidence of free intraperitoneal air. Linear scarring in the visualized lung bases, unchanged. IMPRESSION: 1. Recurrent partial small bowel obstruction. 2. No free intraperitoneal air. These results will be called to the ordering clinician or representative by the Radiologist Assistant, and communication documented in the PACS or zVision Dashboard. Electronically Signed   By: Evangeline Dakin M.D.   On: 08/17/2018 15:19   Dg Abd 1 View  Result Date: 08/01/2018 CLINICAL DATA:  Small-bowel obstruction. EXAM: ABDOMEN - 1 VIEW COMPARISON:  07/31/2018.  CT 07/30/2018. FINDINGS: Small amount of oral contrast noted in duodenum, proximal small bowel, this is progressed from the stomach from prior day's exam. This is progressed from prior exam of 07/31/2018. Air-fluid levels are noted throughout small and large bowel. Again these findings are consistent with possible small and large bowel obstruction as noted on recent CT. No free air is identified. No acute bony abnormality. IMPRESSION: 1. Progression of oral contrast into duodenum/proximal small bowel from prior day's exam. 2. Persistent dilated small and large bowel. Again these findings are consistent with possible small and large bowel obstruction is noted on recent CT. No free air. Electronically Signed   By: Marcello Moores  Register   On: 08/01/2018 13:01   Dg Abdomen 1 View  Result Date:  07/30/2018 CLINICAL DATA:  NG tube placement EXAM: ABDOMEN - 1 VIEW COMPARISON:  CT 07/30/2018 FINDINGS: The chest was included due to the NG tube not being present in the abdomen. The NG tube coils in the upper chest near the thoracic inlet. Right Port-A-Cath in place with the tip at the cavoatrial junction. Diffuse gaseous distention of bowel. IMPRESSION: NG tube coils in the upper chest near the thoracic inlet. The tip is not visualized. Electronically Signed   By: Rolm Baptise M.D.   On: 07/30/2018 20:52   Dg Abd 1 View  Result Date: 07/23/2018 CLINICAL DATA:  Follow-up small bowel obstruction. EXAM: ABDOMEN - 1 VIEW COMPARISON:  07/21/2018, radiographs and CT. FINDINGS: There is persistent dilation of the small bowel with minimal colonic air, findings similar to the prior exam, consistent with a high-grade partial small bowel obstruction. IMPRESSION: 1. No significant change. Persistent high-grade small bowel obstruction. Electronically Signed   By: Lajean Manes M.D.   On: 07/23/2018 08:51   Dg Abdomen 1 View  Result Date: 07/21/2018 CLINICAL DATA:  Bedside nasogastric tube placement. Small bowel obstruction. EXAM: ABDOMEN - 1 VIEW COMPARISON:  CT abdomen and pelvis earlier same day. Abdominal x-rays 07/20/2018, 07/17/2018. FINDINGS: Persistent markedly distended loops of small bowel diffusely throughout the abdomen. The CT pelvis earlier today showed that the transition point is in the pelvis. Gas is present within normal caliber colon, indicating incomplete obstruction. Nasogastric tube tip fundus of the stomach. Contrast material in the urinary bladder related to the IV contrast administration earlier today. IMPRESSION: 1. Nasogastric tube tip in the fundus the stomach. 2. High-grade partial small bowel obstruction as demonstrated on the CT earlier today, worse than on yesterday's KUB. Electronically Signed   By: Evangeline Dakin M.D.   On: 07/21/2018 17:01   Dg Abd 1 View  Result Date:  07/20/2018 CLINICAL DATA:  Abdominal distention and excess gas. EXAM: ABDOMEN - 1 VIEW COMPARISON:  07/17/2018. FINDINGS: Interval dilated small bowel loops. There is also an interval nodular mucosal pattern involving the filled components of the transverse, distal descending and proximal sigmoid colon. No gross free peritoneal air. Lower thoracic and  mild lumbar spine degenerative changes. IMPRESSION: 1. Interval partial small bowel obstruction. 2. Interval nodular mucosal pattern involving the transverse, distal descending and proximal sigmoid colon. This could be due to infectious or inflammatory colitis. Electronically Signed   By: Claudie Revering M.D.   On: 07/20/2018 12:06   Ct Pelvis W Contrast  Result Date: 07/21/2018 CLINICAL DATA:  62 year old with current history of B-cell lymphoma, with evidence of partial small bowel obstruction on abdominal x-rays yesterday. CT abdomen was performed earlier today demonstrating a high-grade small-bowel obstruction, though the transition point was not visible on that examination. EXAM: CT PELVIS WITH CONTRAST (10:40 a.m.: TECHNIQUE: Multidetector CT imaging of the pelvis was performed using the standard protocol following the bolus administration of intravenous contrast. CONTRAST:  One hundred ISOVUE-300 IOPAMIDOL INJECTION 61% IV and oral contrast which were administered at the time of the CT abdomen performed earlier same day at 8:47 a.m. COMPARISON:  CT abdomen and pelvis performed earlier same day. (These examinations will be combined into a single CT abdomen and pelvis order). PET-CT 06/09/2018, 04/18/2018. FINDINGS: Urinary Tract: See report of CT abdomen performed earlier same day as the kidneys are not included on the current examination. Urinary bladder decompressed and normal in appearance, containing contrast. Contrast within normal caliber distal LEFT ureter. Bowel: Dilated loops of small bowel throughout the pelvis with a transition to normal caliber distal  and terminal ileum approximately 6 cm proximal to the ileocecal valve. A small amount of the previously administered oral contrast material is present in the normal caliber distal ileum and in the cecum and ascending colon, indicating incomplete obstruction. The transition point is at the site of previously treated lymphoma where there is scar and post treatment change. Scattered sigmoid colon diverticula are present. Liquid stool is present within normal caliber visualized colon. Vascular/Lymphatic: Scar/post treatment change in the pelvis at the site of the previously treated lymphoma in the pelvis. No residual mass in the ANTERIOR pelvis at the site of the FDG avid lymphoma on the 06/09/2018 PET/CT. No pathologic lymphadenopathy in the pelvis currently. No visible aorto-iliofemoral atherosclerosis. Reproductive: Calcifications within normal sized prostate gland. Normal appearing seminal vesicles. Other:  None. Musculoskeletal: Degenerative changes involving the sacroiliac joints with partial ankylosis of the RIGHT SI joint. 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 Facet degenerative changes at L5-S1. No acute findings. IMPRESSION: 1. The transition point for the small-bowel obstruction is in the pelvis and appears to be approximately 6 cm proximal to the ileocecal valve. The obstruction is felt to be secondary to scar/post treatment change in the pelvis at the site of the previous lymphoma. 2. The FDG avid mass in the ANTERIOR pelvis on the recent PET-CT 06/09/2018 has resolved. No pathologic lymphadenopathy in the pelvis currently. Electronically Signed   By: Evangeline Dakin M.D.   On: 07/21/2018 11:28   Ct Abdomen W Contrast  Result Date: 07/21/2018 CLINICAL DATA:  Left-sided abdominal pain and bloating. Nausea vomiting diarrhea. History of lymphoma. EXAM: CT ABDOMEN WITH CONTRAST TECHNIQUE: Multidetector CT imaging of the abdomen was performed  using the standard protocol following bolus administration of intravenous contrast. CONTRAST:  160m OMNIPAQUE IOHEXOL 300 MG/ML  SOLN COMPARISON:  PET-CT from 06/09/2018 FINDINGS: Lower chest: Subsegmental atelectasis noted within the lung bases no pleural effusion or airspace densities. Hepatobiliary: Tiny low-density structure within the dome of liver measures 4 mm and is too small to characterize. Unchanged. There is relative hypertrophy of the lateral segment  of left lobe of liver. No contour abnormality identified. Gallbladder appears normal. No biliary dilatation. Pancreas: Pancreas appears normal. Spleen: Spleen is unremarkable. Adrenals/Urinary Tract: Normal adrenal glands. Small bilateral low-density kidney lesions are noted, these are less than 1 cm and too small to reliably characterize. Right renal calculus measures 6 mm. No hydronephrosis noted bilaterally. Stomach/Bowel: Stomach appears normal. There is abnormal increase caliber of the visualized small bowel loops which measure up to 4.4 cm. There is also diffuse distension of the colon. Small and large bowel air-fluid levels are identified. The pelvic bowel loops are not included on this exam. Vascular/Lymphatic: Mild aortic atherosclerosis. No aneurysm. No abdominopelvic adenopathy identified. Other: There is trace fluid identified along the pericolic gutters. No pneumoperitoneum. No focal fluid collections identified. Musculoskeletal: Mild spondylosis within the thoracic and lumbar spine. IMPRESSION: 1. Imaging findings concerning for high-grade bowel obstruction. As this exam is of the abdomen only the bowel loops are incompletely visualized, and therefore the transition point (etiology of the obstruction)is indeterminate. Findings may reflect a high-grade partial small bowel obstruction or distal large bowel obstruction. Metabolically active tumor within the pelvis was identified on PET scan from 06/09/2018 and could potentially reflect a site of  obstruction. Consider further evaluation with complete CT of the abdomen pelvis with contrast material for more thorough assessment of the bowel loops. 2. Nonobstructing right renal calculus. Electronically Signed   By: Kerby Moors M.D.   On: 07/21/2018 09:30   Ct Abdomen Pelvis W Contrast  Result Date: 08/17/2018 CLINICAL DATA:  62 year old male with concern for bowel obstruction. History of Burkitt's lymphoma and chemotherapy. EXAM: CT ABDOMEN AND PELVIS WITH CONTRAST TECHNIQUE: Multidetector CT imaging of the abdomen and pelvis was performed using the standard protocol following bolus administration of intravenous contrast. CONTRAST:  167m ISOVUE-300 IOPAMIDOL (ISOVUE-300) INJECTION 61%, 359mOMNIPAQUE IOHEXOL 300 MG/ML SOLN COMPARISON:  CT of the abdomen pelvis dated 07/30/2018 and abdominal radiograph dated 08/17/2018 FINDINGS: Lower chest: There are bibasilar linear atelectasis/scarring. The tip of a central venous catheter is partially visualized at the cavoatrial junction. No intra-abdominal free air.  Trace fluid adjacent to the liver. Hepatobiliary: Subcentimeter hypodense focus in the dome of the liver is too small to characterize. The liver is otherwise unremarkable. No intrahepatic biliary ductal dilatation. There is a focal area of thickening at the gallbladder fundus most consistent with adenomyomatosis. This was present on the prior studies and was not metabolically active on the PET. No calcified gallstone. Pancreas: Unremarkable. No pancreatic ductal dilatation or surrounding inflammatory changes. Spleen: Normal in size without focal abnormality. Adrenals/Urinary Tract: The adrenal glands are unremarkable. There is a 5 mm nonobstructing calculus in the interpolar aspect of the right kidney. No hydronephrosis. Additional smaller nonobstructing stone noted in the superior pole of the right kidney. Subcentimeter bilateral renal hypodense lesions are not characterized but likely represent  cysts. There is no hydronephrosis on either side. There is symmetric enhancement and excretion of contrast by both kidneys. The urinary bladder is grossly unremarkable. Stomach/Bowel: There is segmental thickening of the sigmoid colon involving approximately 8 cm length. There is associated luminal narrowing and stricture of the sigmoid colon with moderate amount of retained stool within the colon proximal to this segment with an appearance of a moderate to high-grade obstruction. The thickened segment of the sigmoid colon may be related to post treatment changes and resulting fibrosis although involvement with a neoplastic process is not excluded. This is however somewhat new since PET-CT of 06/09/2018. There is stranding of  the fat in the pelvis adjacent to the sigmoid colon with tethering of the terminal ileum and appendix consistent with adhesions and scarring. There may be a degree of low-grade or partial small-bowel obstruction at the focus of tethering of the terminal ileum. There is fecalization of the terminal ileum. However, fecal content passes through the colon. The small bowel are dilated measuring up to 6 cm. The appendix is thickened and dilated with fecal content. It measures up to 2.5 cm in diameter. Multiple appendicular with noted in the distal appendix. The changes of the appendix are likely secondary to post radiation treatment. However, acute appendicitis is not entirely excluded. Clinical correlation is recommended. Vascular/Lymphatic: The abdominal aorta and IVC are grossly unremarkable. No portal venous gas. No adenopathy. Reproductive: The prostate and seminal vesicles are grossly unremarkable. Other: None Musculoskeletal: Degenerative changes of the spine. No acute osseous pathology. IMPRESSION: 1. Segmental thickening of the sigmoid colon with associated luminal narrowing and stricture and resulting obstruction at the level of the sigmoid colon. Findings likely related to post treatment  changes although a colonic mass is not entirely excluded. 2. Diffusely dilated small bowel may represent an ileus or a degree of small-bowel obstruction at the level of the terminal ileum secondary to adhesions. Stool however passes into the colon. A small-bowel series may provide better evaluation of the degree of potential obstruction in the terminal ileum. 3. Dilated and thickened appendix, likely related to post treatment changes. Acute appendicitis is not entirely excluded. Clinical correlation is recommended. Electronically Signed   By: Anner Crete M.D.   On: 08/17/2018 21:43   Ct Abdomen Pelvis W Contrast  Result Date: 07/30/2018 CLINICAL DATA:  Abdominal distention and vomiting. Finished chemotherapy for Burkitt's lymphoma. Recent small bowel obstruction. EXAM: CT ABDOMEN AND PELVIS WITH CONTRAST TECHNIQUE: Multidetector CT imaging of the abdomen and pelvis was performed using the standard protocol following bolus administration of intravenous contrast. CONTRAST:  125m ISOVUE-300 IOPAMIDOL (ISOVUE-300) INJECTION 61% COMPARISON:  PET-CT dated 07/28/2018. Pelvis CT dated 07/21/2018. Abdomen CT dated 07/21/2018. FINDINGS: Lower chest: Bilateral lower lobe atelectasis. Hepatobiliary: Small cyst in the dome of the liver on the right. Additional tiny right lobe liver cyst more inferiorly. Small gallbladder Phrygian cap. Pancreas: Unremarkable. No pancreatic ductal dilatation or surrounding inflammatory changes. Spleen: Normal in size without focal abnormality. Adrenals/Urinary Tract: Normal appearing adrenal glands. Small bilateral renal cysts. 7 mm lower pole right renal calculus. No bladder or ureteral calculi and no hydronephrosis. Stomach/Bowel: Multiple significantly dilated, fluid and gas-filled loops of small bowel to the level of the terminal ileum with mildly dilated colon filled with fluid and gas, to the level of the previously demonstrated 7 cm segment of sigmoid colon with diffuse wall  thickening and mild pericolonic soft tissue stranding. A diverticulum is demonstrated at the distal aspect of this abnormality. Also demonstrated is a dilated appendix containing multiple appendicoliths measuring up to 8 mm in maximum diameter each. The appendix measures 14 mm in maximum diameter. There is periappendiceal and pericolonic soft tissue stranding in that region. Vascular/Lymphatic: No significant vascular findings are present. No enlarged abdominal or pelvic lymph nodes. Reproductive: Minimally enlarged prostate gland containing coarse calcifications. Other: Small bilateral inguinal hernias containing fat. Musculoskeletal: Lumbar and lower thoracic spine degenerative changes. IMPRESSION: 1. Recurrent or persistent distal small bowel obstruction to the level of soft tissue stranding in the central pelvis in an area where the appendix is dilated, containing multiple appendicoliths and where there is a 7 cm long segment of  previously demonstrated sigmoid colon thickening and mild pericolonic soft tissue stranding. There is mild colonic dilatation compatible with partial obstruction to the level of the thickened sigmoid colon. There is also mild periappendiceal soft tissue stranding. This could all be due to post treatment changes, appendicitis, sigmoid diverticulitis or a combination of those causes. As previously mentioned, the sigmoid colon wall thickening could also be due to malignancy. 2. 7 mm nonobstructing lower pole right renal calculus. Electronically Signed   By: Claudie Revering M.D.   On: 07/30/2018 18:47   Nm Pet Image Restag (ps) Skull Base To Thigh  Addendum Date: 07/28/2018   ADDENDUM REPORT: 07/28/2018 14:11 ADDENDUM: Findings and recommendations conveyed Mark Frey' s nurse [facility] 07/28/2018 at14:11. Electronically Signed   By: Suzy Bouchard M.D.   On: 07/28/2018 14:11   Result Date: 07/28/2018 CLINICAL DATA:  Subsequent treatment strategy for Burkitt's lymphoma. Post induction  chemotherapy. Chemo immunotherapy. EXAM: NUCLEAR MEDICINE PET SKULL BASE TO THIGH TECHNIQUE: 11.1 mCi F-18 FDG was injected intravenously. Full-ring PET imaging was performed from the skull base to thigh after the radiotracer. CT data was obtained and used for attenuation correction and anatomic localization. Fasting blood glucose: 103 mg/dl COMPARISON:  None. FINDINGS: Mediastinal blood pool activity: SUV max 1.85 NECK: No hypermetabolic lymph nodes in the neck. Incidental CT findings: none CHEST: No hypermetabolic mediastinal or hilar nodes. No suspicious pulmonary nodules on the CT scan. Incidental CT findings: Linear atelectasis at the lung bases. No suspicious nodules. ABDOMEN/PELVIS: Intense hypermetabolic thickening in the proximal sigmoid colon over approximately 7 cm segment (image 170/4) with SUV max equal 16.5. The thickening is circumferential and obscures the lumen at this level. No there are diverticula through this region on comparison exam. There is inflammatory change within the no pericolonic fat of the upper pelvis. No perforation or abscess present. There is mild inflammation in this region on 2 prior comparison PET-CT scans. In the RIGHT lower quadrant is site of prior tumor involvement of the colon there is moderate hypermetabolic activity associated with the bowel wall without any mass lesion. There is scattered activity throughout the bowel. No hypermetabolic nodes in the abdomen pelvis. Normal metabolic activity the spleen which is normal volume. Normal liver. Incidental CT findings: Mildly dilated fluid-filled loops of small bowel. Favor this ileus related to the inflammatory/process involving the sigmoid colon. No perforation or abscess. No pneumatosis. SKELETON: No focal hypermetabolic activity to suggest skeletal metastasis. Incidental CT findings: none IMPRESSION: 1. Masslike thickening of the proximal sigmoid colon with pericolonic inflammation and intense hypermetabolic activity.  Differential include new lymphoma within the proximal sigmoid colon versus diverticulitis versus colitis related to immunotherapy. There are multiple diverticular through this region. Findings are mildly progressive over the last several scans. There is inflammatory stranding through the pericolonic fat which could suggest a inflammatory process such as diverticulitis. The mass-like imaging characteristics and intense metabolic activity is concerning for lymphoma. 2. Dilatation of the small bowel which is fluid-filled likely represents mild ileus related to the sigmoid colon inflammatory/neoplastic process. 3. Moderate activity in the RIGHT colon at site of prior lymphoma is nonspecific as there scattered activity throughout the bowel. Electronically Signed: By: Suzy Bouchard M.D. On: 07/28/2018 13:49   Dg Abd 2 Views  Result Date: 08/11/2018 CLINICAL DATA:  Follow-up bowel obstruction EXAM: ABDOMEN - 2 VIEW COMPARISON:  08/01/2018, 07/31/2018, 07/30/2018 FINDINGS: Mild gas-filled enlarged loops of central small bowel up to 3.9 cm but overall decreased number and degree of small bowel distension. Some  scattered colon gas is noted. There is no free air beneath the diaphragm. IMPRESSION: Mild residual small bowel dilatation but overall decreased fluid levels and number of dilated small bowel loops, consistent with partial or resolving bowel obstruction. Findings are improved radiographically as compared with 08/01/2018. Electronically Signed   By: Donavan Foil M.D.   On: 08/11/2018 15:32   Dg Abd Portable 1 View  Result Date: 08/17/2018 CLINICAL DATA:  62 year old male status post NG tube placement. EXAM: PORTABLE ABDOMEN - 1 VIEW COMPARISON:  CT of the abdomen pelvis dated 08/17/2018 FINDINGS: An enteric tube is partially visualized with side-port in the gastric area and tip in the left upper abdomen likely in the proximal stomach. Dilated loops of small and large bowel noted in the visualized abdomen.  IMPRESSION: Enteric tube with tip in the proximal stomach. Electronically Signed   By: Anner Crete M.D.   On: 08/17/2018 22:49   Dg Abd Portable 1v-small Bowel Obstruction Protocol-initial, 8 Hr Delay  Result Date: 07/31/2018 CLINICAL DATA:  Small-bowel obstruction, 8 hour delayed image. EXAM: PORTABLE ABDOMEN - 1 VIEW COMPARISON:  07/30/2018 FINDINGS: Moderate contrast distention of the gastric fundus. There are dilated gas-filled small bowel loops scattered throughout the abdomen and pelvis consistent with small bowel obstruction. There are contrast distended small bowel loops in the left hemiabdomen with what appear to be some enteric contrast within large bowel loops in the right upper quadrant. Findings would suggest a high grade partial SBO accounting for this pattern. No free air is noted. No organomegaly. No acute osseous appearing. IMPRESSION: Contrast noted within the stomach, small bowel loops in the left hemiabdomen and admixed with stool within large bowel loops in the right upper quadrant. Findings would suggest a high-grade partial SBO. Electronically Signed   By: Ashley Royalty M.D.   On: 07/31/2018 22:02    ASSESSMENT & PLAN:   62 y.o. male with  1. 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 No BM or CNS involvement.   04/18/18 PET/CT revealed Continued improvement in right colonic wall thickening and surrounding bulky masses consistent with treated  lymphoma. No hypermetabolic activity to suggest residual tumor. There is some hypermetabolic activity associated with the sigmoid colon which demonstrates mild wall thickening, surrounding inflammation and underlying diverticulosis, suggesting mild diverticulitis. Suspected treatment related changes throughout the bone marrow. There is mildly increased activity within the clivus without clear corresponding finding on the CT images. Attention on follow-up recommended. Nonobstructing right renal calculi. 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   06/09/18 PET/CT revealedSoft tissue lesion within the ventral pelvis is again identified demonstrating mild to moderate increased uptake within SUV max of 5.61. Deauville criteria 4. Concerning for residual metabolically active tumor. 2. Similar appearance of diffusely thickened appendix within the right lower quadrant of the abdomen. Findings may reflect treatment related changes. 3. Increased radiotracer uptake throughout the bone marrow which is favored to represent treatment related changes. 4. Lad coronary artery atherosclerotic calcifications.   2. 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  3.s/pGI bleeding from lymphoma  involving bowels-- resolved at this time -- will need to monitor with anticoagulation.  4. DM2 with some increased hyperglycemia with steroids Plan -monitor  -continue Oral hypoglycemics and carb consistent diet at this time.   5. DVT prophylaxis - patient on therapeutic Eliquis, ambulation, SCD  6. Chemotherapy related fatigue - maintain good po hydration and food intake and keeping physically active.    7. Small bowel obstruction/ileus with sigmoid thickening from diverticulitis (vs colitis) vs less likely lymphoma progression. LDH WNL. PLAN: - -Discussed pt labwork today -CT scan reviewed-- sigmoid obstructive lesion ? Scar tissue from resolved lymphoma vs residual lymphoma vs post inflammatory scarring from  diverticulitis/limited perforation. Also has SBO from adhesions. Had significant bowel adhesions from initial lymphoma. -bowel rest -IVF -replace lytes to K>4 and Magnesium >2 -imaging unclear for lymphoma progression and recent LDH has been WNL. -GI and surgery input appreciated -if bowels clear up and he can tolerate prep -- might benefit from colonoscopy vs sigmoidoscopy to study/biopsy colonic pathology. -if unresolving obstruction (now 3rd admission)- might need surgical consideration. -holding indefinitely at this time consideration of post chemo consolidative radiation. (has had not received RT as implied in CT report) -will continue to follow as needed and after GI w/u.   I spent 30 minutes counseling the patient face to face. The total time spent in the appointment was 35 minutes and more than 50% was on counseling and direct patient cares.    Sullivan Lone MD [place] AAHIVMS [facility] Hematology/Oncology Physician [facility]  (Office):       [phone] (Work cell):  [phone] (Fax):           [phone]  08/18/2018 6:20 PM

## 2018-08-18 NOTE — ED Notes (Signed)
Awaiting pharmacy to verify medication

## 2018-08-18 NOTE — ED Notes (Signed)
Family at bedside. 

## 2018-08-19 ENCOUNTER — Inpatient Hospital Stay (HOSPITAL_COMMUNITY): Payer: BLUE CROSS/BLUE SHIELD

## 2018-08-19 ENCOUNTER — Encounter (HOSPITAL_COMMUNITY)
Admission: EM | Disposition: A | Discharge status: Home Health | Payer: Self-pay | Source: Home / Self Care | Attending: Internal Medicine

## 2018-08-19 ENCOUNTER — Encounter (HOSPITAL_COMMUNITY): Payer: Self-pay | Admitting: Certified Registered"

## 2018-08-19 DIAGNOSIS — R935 Abnormal findings on diagnostic imaging of other abdominal regions, including retroperitoneum: Secondary | ICD-10-CM

## 2018-08-19 DIAGNOSIS — E43 Unspecified severe protein-calorie malnutrition: Secondary | ICD-10-CM

## 2018-08-19 DIAGNOSIS — K56699 Other intestinal obstruction unspecified as to partial versus complete obstruction: Secondary | ICD-10-CM

## 2018-08-19 LAB — APTT
aPTT: 42 seconds — ABNORMAL HIGH (ref 24–36)
aPTT: 53 seconds — ABNORMAL HIGH (ref 24–36)

## 2018-08-19 LAB — CBC WITH DIFFERENTIAL/PLATELET
Abs Immature Granulocytes: 0.02 10*3/uL (ref 0.00–0.07)
BASOS ABS: 0 10*3/uL (ref 0.0–0.1)
Basophils Relative: 0 %
EOS ABS: 0 10*3/uL (ref 0.0–0.5)
Eosinophils Relative: 0 %
HCT: 32 % — ABNORMAL LOW (ref 39.0–52.0)
Hemoglobin: 9.3 g/dL — ABNORMAL LOW (ref 13.0–17.0)
Immature Granulocytes: 0 %
Lymphocytes Relative: 34 %
Lymphs Abs: 2 10*3/uL (ref 0.7–4.0)
MCH: 29.5 pg (ref 26.0–34.0)
MCHC: 29.1 g/dL — AB (ref 30.0–36.0)
MCV: 101.6 fL — ABNORMAL HIGH (ref 80.0–100.0)
Monocytes Absolute: 0.4 10*3/uL (ref 0.1–1.0)
Monocytes Relative: 7 %
NRBC: 0 % (ref 0.0–0.2)
Neutro Abs: 3.4 10*3/uL (ref 1.7–7.7)
Neutrophils Relative %: 59 %
Platelets: 213 10*3/uL (ref 150–400)
RBC: 3.15 MIL/uL — ABNORMAL LOW (ref 4.22–5.81)
RDW: 18.8 % — AB (ref 11.5–15.5)
WBC: 5.8 10*3/uL (ref 4.0–10.5)

## 2018-08-19 LAB — HEPARIN LEVEL (UNFRACTIONATED): Heparin Unfractionated: 0.87 IU/mL — ABNORMAL HIGH (ref 0.30–0.70)

## 2018-08-19 LAB — BASIC METABOLIC PANEL
Anion gap: 12 (ref 5–15)
BUN: 12 mg/dL (ref 8–23)
CO2: 26 mmol/L (ref 22–32)
Calcium: 8.5 mg/dL — ABNORMAL LOW (ref 8.9–10.3)
Chloride: 102 mmol/L (ref 98–111)
Creatinine, Ser: 0.69 mg/dL (ref 0.61–1.24)
GFR calc Af Amer: >60 mL/min (ref >60)
GFR calc non Af Amer: >60 mL/min (ref >60)
Glucose, Bld: 89 mg/dL (ref 70–99)
Potassium: 3.8 mmol/L (ref 3.5–5.1)
Sodium: 140 mmol/L (ref 135–145)

## 2018-08-19 LAB — GLUCOSE, CAPILLARY: Glucose-Capillary: 91 mg/dL (ref 70–99)

## 2018-08-19 SURGERY — SIGMOIDOSCOPY, FLEXIBLE
Anesthesia: Moderate Sedation

## 2018-08-19 MED ORDER — PHENOL 1.4 % MT LIQD
1.0000 | OROMUCOSAL | Status: DC | PRN
  Filled 2018-08-19: qty 177

## 2018-08-19 MED ORDER — DEXTROSE 50 % IV SOLN
1.0000 | Freq: Once | INTRAVENOUS | Status: AC
  Administered 2018-08-19: 50 mL via INTRAVENOUS
  Filled 2018-08-19: qty 50

## 2018-08-19 MED ORDER — HEPARIN (PORCINE) 25000 UT/250ML-% IV SOLN
2100.0000 [IU]/h | INTRAVENOUS | Status: DC
  Administered 2018-08-19: 2100 [IU]/h via INTRAVENOUS
  Filled 2018-08-19 (×2): qty 250

## 2018-08-19 MED ORDER — HEPARIN BOLUS VIA INFUSION
2000.0000 [IU] | Freq: Once | INTRAVENOUS | Status: AC
  Administered 2018-08-19: 2000 [IU] via INTRAVENOUS
  Filled 2018-08-19: qty 2000

## 2018-08-19 MED ORDER — SODIUM CHLORIDE 0.9% FLUSH
10.0000 mL | INTRAVENOUS | Status: DC | PRN
  Administered 2018-08-21 – 2018-08-22 (×2): 10 mL
  Filled 2018-08-19 (×2): qty 40

## 2018-08-19 MED ORDER — DEXTROSE-NACL 5-0.45 % IV SOLN
INTRAVENOUS | Status: AC
  Administered 2018-08-19 – 2018-08-21 (×4): via INTRAVENOUS

## 2018-08-19 MED ORDER — HEPARIN (PORCINE) 25000 UT/250ML-% IV SOLN
1900.0000 [IU]/h | INTRAVENOUS | Status: DC
  Administered 2018-08-19: 1900 [IU]/h via INTRAVENOUS
  Filled 2018-08-19 (×2): qty 250

## 2018-08-19 NOTE — Progress Notes (Addendum)
[facility] Progress Note  CC:  Bowel obstruction  Subjective:  X-ray this AM shows continued small bowel dilatation.  He does feel better though with more noise in abdomen, feels less bloated.  Seems to have decreasing NGT output.  Objective:  Vital signs in last 24 hours: Temp:  [97.7 F (36.5 C)-98.2 F (36.8 C)] 97.7 F (36.5 C) (12/07 0359) Pulse Rate:  [96-108] 101 (12/07 0359) Resp:  [13-18] 16 (12/07 0359) BP: (120-130)/(80-89) 120/89 (12/07 0359) SpO2:  [92 %-97 %] 96 % (12/07 0359) Weight:  [90.1 kg] 90.1 kg (12/07 0700) Last BM Date: 08/13/18 General:  Alert, Well-developed, in NAD; NGT in place. Heart:  Slightly tachy; no murmurs Pulm:  CTAB.  No increased WOB. Abdomen:  Soft, minimally distended.  BS present.  Non-tender.   Extremities:  Without edema. Neurologic:  Alert and oriented x 4;  grossly normal neurologically. Psych:  Alert and cooperative. Normal mood and affect.  Intake/Output from previous day: 12/06 0701 - 12/07 0700 In: 1082.2 [I.V.:1042.1; IV Piggyback:40.1] Out: 625 [Urine:625]  Lab Results: Recent Labs    08/17/18 1738 08/18/18 0510 08/19/18 0607  WBC 7.2 5.1 5.8  HGB 9.6* 8.6* 9.3*  HCT 31.7* 29.1* 32.0*  PLT 214 203 213   BMET Recent Labs    08/17/18 1738 08/18/18 0510 08/19/18 0607  NA 136 136 140  K 4.3 3.8 3.8  CL 97* 101 102  CO2 29 26 26   GLUCOSE 113* 102* 89  BUN 13 12 12   CREATININE 0.64 0.54* 0.69  CALCIUM 8.7* 8.3* 8.5*   LFT Recent Labs    08/18/18 0510  PROT 5.0*  ALBUMIN 2.7*  AST 11*  ALT 10  ALKPHOS 47  BILITOT 0.4   Dg Abd 1 View  Result Date: 08/19/2018 CLINICAL DATA:  62 year old male with bowel obstruction. EXAM: ABDOMEN - 1 VIEW COMPARISON:  Abdominal radiograph dated 08/17/2018 and CT dated 08/17/2018 FINDINGS: There is persistent diffuse dilatation of air-filled loops of small bowel measuring up to 6 cm in the left lower quadrant. No free air. The osseous structures and  soft tissues are grossly unremarkable. IMPRESSION: Persistent small bowel dilatation.  Continued follow-up recommended. Electronically Signed   By: Anner Crete M.D.   On: 08/19/2018 05:40   Dg Abd 1 View  Result Date: 08/17/2018 CLINICAL DATA:  62 year old presenting with abdominal distention and absence of bowel movements over the past 5 days. Patient was recently released from the hospital after having a partial small bowel obstruction. Personal history of lymphoma. EXAM: ABDOMEN - 1 VIEW COMPARISON:  08/11/2018, 08/01/2018 and earlier. Chest x-ray 07/30/2018. FINDINGS: AP ERECT image was obtained. Multiple markedly distended loops of small bowel throughout the upper abdomen demonstrating air-fluid levels. Distal small bowel of normal caliber. Normal caliber colon with moderate stool burden in the ascending colon. No evidence of free intraperitoneal air. Linear scarring in the visualized lung bases, unchanged. IMPRESSION: 1. Recurrent partial small bowel obstruction. 2. No free intraperitoneal air. These results will be called to the ordering clinician or representative by the Radiologist Assistant, and communication documented in the PACS or zVision Dashboard. Electronically Signed   By: Evangeline Dakin M.D.   On: 08/17/2018 15:19   Ct Abdomen Pelvis W Contrast  Result Date: 08/17/2018 CLINICAL DATA:  62 year old male with concern for bowel obstruction. History of Burkitt's lymphoma and chemotherapy. EXAM: CT ABDOMEN AND PELVIS WITH CONTRAST TECHNIQUE: Multidetector CT imaging of the abdomen and pelvis was performed using the standard protocol  following bolus administration of intravenous contrast. CONTRAST:  15m ISOVUE-300 IOPAMIDOL (ISOVUE-300) INJECTION 61%, 372mOMNIPAQUE IOHEXOL 300 MG/ML SOLN COMPARISON:  CT of the abdomen pelvis dated 07/30/2018 and abdominal radiograph dated 08/17/2018 FINDINGS: Lower chest: There are bibasilar linear atelectasis/scarring. The tip of a central venous  catheter is partially visualized at the cavoatrial junction. No intra-abdominal free air.  Trace fluid adjacent to the liver. Hepatobiliary: Subcentimeter hypodense focus in the dome of the liver is too small to characterize. The liver is otherwise unremarkable. No intrahepatic biliary ductal dilatation. There is a focal area of thickening at the gallbladder fundus most consistent with adenomyomatosis. This was present on the prior studies and was not metabolically active on the PET. No calcified gallstone. Pancreas: Unremarkable. No pancreatic ductal dilatation or surrounding inflammatory changes. Spleen: Normal in size without focal abnormality. Adrenals/Urinary Tract: The adrenal glands are unremarkable. There is a 5 mm nonobstructing calculus in the interpolar aspect of the right kidney. No hydronephrosis. Additional smaller nonobstructing stone noted in the superior pole of the right kidney. Subcentimeter bilateral renal hypodense lesions are not characterized but likely represent cysts. There is no hydronephrosis on either side. There is symmetric enhancement and excretion of contrast by both kidneys. The urinary bladder is grossly unremarkable. Stomach/Bowel: There is segmental thickening of the sigmoid colon involving approximately 8 cm length. There is associated luminal narrowing and stricture of the sigmoid colon with moderate amount of retained stool within the colon proximal to this segment with an appearance of a moderate to high-grade obstruction. The thickened segment of the sigmoid colon may be related to post treatment changes and resulting fibrosis although involvement with a neoplastic process is not excluded. This is however somewhat new since PET-CT of 06/09/2018. There is stranding of the fat in the pelvis adjacent to the sigmoid colon with tethering of the terminal ileum and appendix consistent with adhesions and scarring. There may be a degree of low-grade or partial small-bowel obstruction  at the focus of tethering of the terminal ileum. There is fecalization of the terminal ileum. However, fecal content passes through the colon. The small bowel are dilated measuring up to 6 cm. The appendix is thickened and dilated with fecal content. It measures up to 2.5 cm in diameter. Multiple appendicular with noted in the distal appendix. The changes of the appendix are likely secondary to post radiation treatment. However, acute appendicitis is not entirely excluded. Clinical correlation is recommended. Vascular/Lymphatic: The abdominal aorta and IVC are grossly unremarkable. No portal venous gas. No adenopathy. Reproductive: The prostate and seminal vesicles are grossly unremarkable. Other: None Musculoskeletal: Degenerative changes of the spine. No acute osseous pathology. IMPRESSION: 1. Segmental thickening of the sigmoid colon with associated luminal narrowing and stricture and resulting obstruction at the level of the sigmoid colon. Findings likely related to post treatment changes although a colonic mass is not entirely excluded. 2. Diffusely dilated small bowel may represent an ileus or a degree of small-bowel obstruction at the level of the terminal ileum secondary to adhesions. Stool however passes into the colon. A small-bowel series may provide better evaluation of the degree of potential obstruction in the terminal ileum. 3. Dilated and thickened appendix, likely related to post treatment changes. Acute appendicitis is not entirely excluded. Clinical correlation is recommended. Electronically Signed   By: ArAnner Crete.D.   On: 08/17/2018 21:43   Dg Abd Portable 1 View  Result Date: 08/17/2018 CLINICAL DATA:  [age]ear old male status post NG tube placement. EXAM: PORTABLE ABDOMEN -  1 VIEW COMPARISON:  CT of the abdomen pelvis dated 08/17/2018 FINDINGS: An enteric tube is partially visualized with side-port in the gastric area and tip in the left upper abdomen likely in the proximal stomach.  Dilated loops of small and large bowel noted in the visualized abdomen. IMPRESSION: Enteric tube with tip in the proximal stomach. Electronically Signed   By: Anner Crete M.D.   On: 08/17/2018 22:49   Assessment / Plan: 1. 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  Completed chemo late October.  Since then patient has had 2 admissions for SBO though CT scans and PET scans have both previously demonstrated wall thickening of sigmoid as well.  Of note, colonoscopy in July negative for any sigmoid findings . Now with definite obstruction at level of sigmoid colon.  Not clear if this is intraluminal obstruction (lymphoma).  Extrinsic compression is a possibility.  Scarring from diverticulitis?  Infectious seems less likely. -continue NGT with LIS -am abdominal films. -will likely need at least flex sig at some point once obstruction resolved/improved.  2. DVT / PE. Home Xarelto on hold. On heparin gtt.  -will need to hold heparin gtt for procedure when we plan for that.    LOS: 2 days   Laban Emperor. Zehr  08/19/2018, 9:02 AM  GI ATTENDING  Interval history and data reviewed.  Patient seen and examined.  Wife and sister at bedside.  Still with significant bowel dilation on imaging today.  This patient cannot safely prep for colonoscopy at this time.  We will administer additional enemas.  I will set him up for "gentle flexible sigmoidoscopy" tomorrow to see if there is additional information to gain in evaluating the stricture of sigmoid colon region.  I do suspect this patient is going to come to surgery at some point regarding management of stricturing of the colon and/or small bowel.  Docia Chuck. Geri [place]in[place]., M.D. [facility] Division of Gastroenterology

## 2018-08-19 NOTE — Progress Notes (Signed)
Pharmacy Antibiotic Note  Mark Frey is a 62 y.o. male with a h/o lymphoma admitted on 08/17/2018 with SBO.  Pharmacy has been consulted for Zosyn dosing per surgery until r/o inflammatory process/appendicitis.   Plan:  Continue Zosyn 3.375 g EI q 8h  Good renal function, labs stable WNL, and no Cx obtained; pharmacy will sign off, following peripherally for new Cx results or renal changes  Height: 5' 9"  (175.3 cm) Weight: 198 lb 10.2 oz (90.1 kg) IBW/kg (Calculated) : 70.7  Temp (24hrs), Avg:98 F (36.7 C), Min:97.7 F (36.5 C), Max:98.2 F (36.8 C)  Recent Labs  Lab 08/17/18 1738 08/18/18 0510 08/19/18 0607  WBC 7.2 5.1 5.8  CREATININE 0.64 0.54* 0.69    Estimated Creatinine Clearance: 106.3 mL/min (by C-G formula based on SCr of 0.69 mg/dL).    Allergies  Allergen Reactions  . Ciprofloxacin Other (See Comments)    Leg tingling    Thank you for allowing pharmacy to be a part of this patient's care.  Reuel Boom, PharmD, BCPS [phone] 08/19/2018, 8:56 AM

## 2018-08-19 NOTE — Progress Notes (Signed)
PROGRESS NOTE  [name]:[id number] DOB: Feb 10, 1956 DOA: 08/17/2018 PCP: [name], MD  HPI/Recap of past 24 hours: HPI from Dr Jenny Reichmann is a 62 y.o. male with history of lymphoma, DM, Hx of DVT, who was recently admitted for small bowel obstruction related to the lymphoma treated conservatively presents to the ER because he has not moved bowels for last 7 days.  Patient also noticed increasing bowel distention. Denies any vomiting or abdominal pain. In the ER CAT scan shows bowel obstruction for which patient has been placed on NG tube suction.  On-call general surgeon has been consulted and admitted for further management.   Today, patient reports minimal improvement after NGT placement. Still with some distension, denies any abdominal pain, N/V, fever/chills.  Assessment/Plan: Principal Problem:   Bowel obstruction in setting of lymphoma and sigmoid stricture Active Problems:   Morbid obesity ([place])   Controlled diabetes mellitus type II without complication (HCC)   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HCC)   Deep vein thrombosis (DVT) of femoral vein of right lower extremity (HCC)   Protein-calorie malnutrition, severe   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HCC)  Recurrent small bowel obstruction CT abdomen/pelvis showed: Diffusely dilated small bowel may represent an ileus or a degree of SBO at the level of the terminal ileum secondary to adhesions. Segmental thickening of the sigmoid colon with associated luminal narrowing and stricture and resulting obstruction at the level of the sigmoid colon. Findings likely related to post treatment changes although a colonic mass is not entirely excluded General surgery on board, appreciate recs GI on board, appreciate recs: for possible gentle flex sig 08/20/18 N.p.o., IV fluids, NG tube, pain management IV Zosyn as per general surgery Monitor closely  Diabetes mellitus type 2 Had an episode of hypoglycemia 08/19/18, will  change IVF to d5W/1/2NS  SSI, Accu-Cheks, hypoglycemic protocol  History of DVT Hold home apixaban, continue heparin drip pending procedures  High-grade cell lymphoma Diagnosed in July, completed chemo late October Followed by oncologist [name]         Malnutrition Type:  Nutrition Problem: Severe Malnutrition Etiology: chronic illness, cancer and cancer related treatments   Malnutrition Characteristics:  Signs/Symptoms: energy intake < or equal to 75% for > or equal to 1 month, severe fat depletion, percent weight loss, mild muscle depletion   Nutrition Interventions:  Interventions: Ensure Enlive (each supplement provides 350kcal and 20 grams of protein), Refer to RD note for recommendations, MVI    Estimated body mass index is 29.33 kg/m as calculated from the following:   Height as of this encounter: 5' 9"  (1.753 m).   Weight as of this encounter: 90.1 kg.     Code Status: Full  Family Communication: Wife at bedside  Disposition Plan: To be determined pending work up   Consultants:  General surgery  GI  Procedures:  None  Antimicrobials:  IV Zosyn  DVT prophylaxis: Heparin drip   Objective: Vitals:   08/18/18 1500 08/18/18 2040 08/19/18 0359 08/19/18 0700  BP:  128/83 120/89   Pulse: (!) 101 (!) 108 (!) 101   Resp:  17 16   Temp:  98 F (36.7 C) 97.7 F (36.5 C)   TempSrc:   Oral   SpO2:  97% 96%   Weight:    90.1 kg  Height:        Intake/Output Summary (Last 24 hours) at 08/19/2018 1807 Last data filed at 08/19/2018 0700 Gross per 24 hour  Intake -  Output 625 ml  Net -625 ml   Filed Weights   08/17/18 1555 08/19/18 0700  Weight: 90.3 kg 90.1 kg    Exam   General: NAD  Cardiovascular: S1, S2 present  Respiratory: CTA B  Abdomen: Soft, nontender, + distention, hypoactive bowel sounds, NGT in place  Musculoskeletal: No pedal edema bilaterally  Skin: Normal  Psychiatry: Normal mood   Data  Reviewed: CBC: Recent Labs  Lab 08/17/18 1738 08/18/18 0510 08/19/18 0607  WBC 7.2 5.1 5.8  NEUTROABS 4.1 2.8 3.4  HGB 9.6* 8.6* 9.3*  HCT 31.7* 29.1* 32.0*  MCV 98.4 100.0 101.6*  PLT 214 203 941   Basic Metabolic Panel: Recent Labs  Lab 08/17/18 1738 08/18/18 0510 08/19/18 0607  NA 136 136 140  K 4.3 3.8 3.8  CL 97* 101 102  CO2 29 26 26   GLUCOSE 113* 102* 89  BUN 13 12 12   CREATININE 0.64 0.54* 0.69  CALCIUM 8.7* 8.3* 8.5*   GFR: Estimated Creatinine Clearance: 106.3 mL/min (by C-G formula based on SCr of 0.69 mg/dL). Liver Function Tests: Recent Labs  Lab 08/17/18 1738 08/18/18 0510  AST 13* 11*  ALT 13 10  ALKPHOS 56 47  BILITOT 0.5 0.4  PROT 5.7* 5.0*  ALBUMIN 2.7* 2.7*   Recent Labs  Lab 08/17/18 1738  LIPASE 19   No results for input(s): AMMONIA in the last 168 hours. Coagulation Profile: No results for input(s): INR, PROTIME in the last 168 hours. Cardiac Enzymes: No results for input(s): CKTOTAL, CKMB, CKMBINDEX, TROPONINI in the last 168 hours. BNP (last 3 results) No results for input(s): PROBNP in the last 8760 hours. HbA1C: No results for input(s): HGBA1C in the last 72 hours. CBG: Recent Labs  Lab 08/18/18 0228 08/18/18 0416 08/18/18 0828 08/18/18 1704  GLUCAP 110* 94 98 86   Lipid Profile: No results for input(s): CHOL, HDL, LDLCALC, TRIG, CHOLHDL, LDLDIRECT in the last 72 hours. Thyroid Function Tests: No results for input(s): TSH, T4TOTAL, FREET4, T3FREE, THYROIDAB in the last 72 hours. Anemia Panel: No results for input(s): VITAMINB12, FOLATE, FERRITIN, TIBC, IRON, RETICCTPCT in the last 72 hours. Urine analysis:    Component Value Date/Time   COLORURINE AMBER (A) 08/17/2018 1624   APPEARANCEUR CLEAR 08/17/2018 1624   LABSPEC >1.046 (H) 08/17/2018 1624   PHURINE 5.0 08/17/2018 1624   GLUCOSEU NEGATIVE 08/17/2018 1624   HGBUR NEGATIVE 08/17/2018 1624   HGBUR negative 01/20/2009 0840   BILIRUBINUR NEGATIVE 08/17/2018  1624   BILIRUBINUR Small 01/30/2018 1652   KETONESUR NEGATIVE 08/17/2018 1624   PROTEINUR NEGATIVE 08/17/2018 1624   UROBILINOGEN 2.0 (A) 01/30/2018 1652   UROBILINOGEN 0.2 01/20/2009 0840   NITRITE NEGATIVE 08/17/2018 1624   LEUKOCYTESUR NEGATIVE 08/17/2018 1624   Sepsis Labs: @LABRCNTIP (procalcitonin:4,lacticidven:4)  )No results found for this or any previous visit (from the past 240 hour(s)).    Studies: Dg Abd 1 View  Result Date: 08/19/2018 CLINICAL DATA:  62 year old male with bowel obstruction. EXAM: ABDOMEN - 1 VIEW COMPARISON:  Abdominal radiograph dated 08/17/2018 and CT dated 08/17/2018 FINDINGS: There is persistent diffuse dilatation of air-filled loops of small bowel measuring up to 6 cm in the left lower quadrant. No free air. The osseous structures and soft tissues are grossly unremarkable. IMPRESSION: Persistent small bowel dilatation.  Continued follow-up recommended. Electronically Signed   By: Anner Crete M.D.   On: 08/19/2018 05:40    Scheduled Meds: . insulin aspart  0-9 Units Subcutaneous Q4H    Continuous Infusions: . dextrose  5 % and 0.45% NaCl 100 mL/hr at 08/19/18 1750  . heparin 2,100 Units/hr (08/19/18 1743)  . piperacillin-tazobactam (ZOSYN)  IV 3.375 g (08/19/18 0948)     LOS: 2 days     Alma Friendly, MD [facility]  If 7PM-7AM, please contact night-coverage [email or web address] 08/19/2018, 6:07 PM

## 2018-08-19 NOTE — Progress Notes (Addendum)
[place] for heparin Indication: hx pulmonary embolus and DVT (home Eliquis on hold)  Allergies  Allergen Reactions  . Ciprofloxacin Other (See Comments)    Leg tingling    Patient Measurements: Height: 5' 9"  (175.3 cm) Weight: 198 lb 10.2 oz (90.1 kg) IBW/kg (Calculated) : 70.7 Heparin Dosing Weight: 89 kg  Vital Signs: Temp: 97.7 F (36.5 C) (12/07 0359) Temp Source: Oral (12/07 0359) BP: 120/89 (12/07 0359) Pulse Rate: 101 (12/07 0359)  Labs: Recent Labs    08/17/18 1738 08/18/18 0510 08/18/18 1131 08/18/18 1803 08/19/18 0607  HGB 9.6* 8.6*  --   --  9.3*  HCT 31.7* 29.1*  --   --  32.0*  PLT 214 203  --   --  213  APTT  --   --  45* 32 42*  HEPARINUNFRC  --   --  1.09* 0.99* 0.87*  CREATININE 0.64 0.54*  --   --  0.69    Estimated Creatinine Clearance: 106.3 mL/min (by C-G formula based on SCr of 0.69 mg/dL).   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  Abd CT on 12/5 showed bowel obstruction. Patient was transitioned from Eliquis to heparin drip on admission since patient is NPO and in case surgical intervention is needed.   Baseline INR, aPTT: not done  Prior anticoagulation: Eliquis 5 mg bid, LD 12/4 at 1800  Significant events:  Today, 08/19/2018:  CBC: Hgb low but stable, Plt WNL  Most recent aPTT remains subtherapeutic on 1500 units/hr; Anti-Xa level still elevated from recent FXaI use  No bleeding or infusion issues per nursing  CrCl: > 90 ml/min  Goal of Therapy: Heparin level 0.3-0.7 units/ml aPTT 66-102 seconds Monitor platelets by anticoagulation protocol: Yes  Plan:  Heparin 2000 units IV bolus x 1  Increase heparin IV infusion to 1900 units/hr  Check aPTT level 6 hrs after rate change  Daily CBC and heparin  level  Monitor for signs of bleeding or thrombosis   Reuel Boom, PharmD, BCPS [phone] 08/19/2018, 8:45 AM   ADDENDUM  1500 aPTT still low at 53 sec; no line or bleeding issues per RN   Rebolus 2000 units x 1  Increase heparin to 2100 units/hr  Repeat aPTT in 6 hrs  Reuel Boom, PharmD, BCPS [phone] 08/19/2018, 5:04 PM

## 2018-08-19 NOTE — Progress Notes (Signed)
Subjective/Chief Complaint: No complaints   Objective: Vital signs in last 24 hours: Temp:  [97.7 F (36.5 C)-98.2 F (36.8 C)] 97.7 F (36.5 C) (12/07 0359) Pulse Rate:  [101-108] 101 (12/07 0359) Resp:  [16-18] 16 (12/07 0359) BP: (120-130)/(83-89) 120/89 (12/07 0359) SpO2:  [96 %-97 %] 96 % (12/07 0359) Weight:  [90.1 kg] 90.1 kg (12/07 0700) Last BM Date: 08/13/18  Intake/Output from previous day: 12/06 0701 - 12/07 0700 In: 1082.2 [I.V.:1042.1; IV Piggyback:40.1] Out: 625 [Urine:625] Intake/Output this shift: No intake/output data recorded.  General appearance: alert and cooperative Resp: clear to auscultation bilaterally Cardio: regular rate and rhythm GI: soft, nontender  Lab Results:  Recent Labs    08/18/18 0510 08/19/18 0607  WBC 5.1 5.8  HGB 8.6* 9.3*  HCT 29.1* 32.0*  PLT 203 213   BMET Recent Labs    08/18/18 0510 08/19/18 0607  NA 136 140  K 3.8 3.8  CL 101 102  CO2 26 26  GLUCOSE 102* 89  BUN 12 12  CREATININE 0.54* 0.69  CALCIUM 8.3* 8.5*   PT/INR No results for input(s): LABPROT, INR in the last 72 hours. ABG No results for input(s): PHART, HCO3 in the last 72 hours.  Invalid input(s): PCO2, PO2  Studies/Results: Dg Abd 1 View  Result Date: 08/19/2018 CLINICAL DATA:  62 year old male with bowel obstruction. EXAM: ABDOMEN - 1 VIEW COMPARISON:  Abdominal radiograph dated 08/17/2018 and CT dated 08/17/2018 FINDINGS: There is persistent diffuse dilatation of air-filled loops of small bowel measuring up to 6 cm in the left lower quadrant. No free air. The osseous structures and soft tissues are grossly unremarkable. IMPRESSION: Persistent small bowel dilatation.  Continued follow-up recommended. Electronically Signed   By: Anner Crete M.D.   On: 08/19/2018 05:40   Dg Abd 1 View  Result Date: 08/17/2018 CLINICAL DATA:  62 year old presenting with abdominal distention and absence of bowel movements over the past 5 days. Patient  was recently released from the hospital after having a partial small bowel obstruction. Personal history of lymphoma. EXAM: ABDOMEN - 1 VIEW COMPARISON:  08/11/2018, 08/01/2018 and earlier. Chest x-ray 07/30/2018. FINDINGS: AP ERECT image was obtained. Multiple markedly distended loops of small bowel throughout the upper abdomen demonstrating air-fluid levels. Distal small bowel of normal caliber. Normal caliber colon with moderate stool burden in the ascending colon. No evidence of free intraperitoneal air. Linear scarring in the visualized lung bases, unchanged. IMPRESSION: 1. Recurrent partial small bowel obstruction. 2. No free intraperitoneal air. These results will be called to the ordering clinician or representative by the Radiologist Assistant, and communication documented in the PACS or zVision Dashboard. Electronically Signed   By: Evangeline Dakin M.D.   On: 08/17/2018 15:19   Ct Abdomen Pelvis W Contrast  Result Date: 08/17/2018 CLINICAL DATA:  62 year old male with concern for bowel obstruction. History of Burkitt's lymphoma and chemotherapy. EXAM: CT ABDOMEN AND PELVIS WITH CONTRAST TECHNIQUE: Multidetector CT imaging of the abdomen and pelvis was performed using the standard protocol following bolus administration of intravenous contrast. CONTRAST:  148m ISOVUE-300 IOPAMIDOL (ISOVUE-300) INJECTION 61%, 374mOMNIPAQUE IOHEXOL 300 MG/ML SOLN COMPARISON:  CT of the abdomen pelvis dated 07/30/2018 and abdominal radiograph dated 08/17/2018 FINDINGS: Lower chest: There are bibasilar linear atelectasis/scarring. The tip of a central venous catheter is partially visualized at the cavoatrial junction. No intra-abdominal free air.  Trace fluid adjacent to the liver. Hepatobiliary: Subcentimeter hypodense focus in the dome of the liver is too small to characterize. The  liver is otherwise unremarkable. No intrahepatic biliary ductal dilatation. There is a focal area of thickening at the gallbladder fundus  most consistent with adenomyomatosis. This was present on the prior studies and was not metabolically active on the PET. No calcified gallstone. Pancreas: Unremarkable. No pancreatic ductal dilatation or surrounding inflammatory changes. Spleen: Normal in size without focal abnormality. Adrenals/Urinary Tract: The adrenal glands are unremarkable. There is a 5 mm nonobstructing calculus in the interpolar aspect of the right kidney. No hydronephrosis. Additional smaller nonobstructing stone noted in the superior pole of the right kidney. Subcentimeter bilateral renal hypodense lesions are not characterized but likely represent cysts. There is no hydronephrosis on either side. There is symmetric enhancement and excretion of contrast by both kidneys. The urinary bladder is grossly unremarkable. Stomach/Bowel: There is segmental thickening of the sigmoid colon involving approximately 8 cm length. There is associated luminal narrowing and stricture of the sigmoid colon with moderate amount of retained stool within the colon proximal to this segment with an appearance of a moderate to high-grade obstruction. The thickened segment of the sigmoid colon may be related to post treatment changes and resulting fibrosis although involvement with a neoplastic process is not excluded. This is however somewhat new since PET-CT of 06/09/2018. There is stranding of the fat in the pelvis adjacent to the sigmoid colon with tethering of the terminal ileum and appendix consistent with adhesions and scarring. There may be a degree of low-grade or partial small-bowel obstruction at the focus of tethering of the terminal ileum. There is fecalization of the terminal ileum. However, fecal content passes through the colon. The small bowel are dilated measuring up to 6 cm. The appendix is thickened and dilated with fecal content. It measures up to 2.5 cm in diameter. Multiple appendicular with noted in the distal appendix. The changes of the  appendix are likely secondary to post radiation treatment. However, acute appendicitis is not entirely excluded. Clinical correlation is recommended. Vascular/Lymphatic: The abdominal aorta and IVC are grossly unremarkable. No portal venous gas. No adenopathy. Reproductive: The prostate and seminal vesicles are grossly unremarkable. Other: None Musculoskeletal: Degenerative changes of the spine. No acute osseous pathology. IMPRESSION: 1. Segmental thickening of the sigmoid colon with associated luminal narrowing and stricture and resulting obstruction at the level of the sigmoid colon. Findings likely related to post treatment changes although a colonic mass is not entirely excluded. 2. Diffusely dilated small bowel may represent an ileus or a degree of small-bowel obstruction at the level of the terminal ileum secondary to adhesions. Stool however passes into the colon. A small-bowel series may provide better evaluation of the degree of potential obstruction in the terminal ileum. 3. Dilated and thickened appendix, likely related to post treatment changes. Acute appendicitis is not entirely excluded. Clinical correlation is recommended. Electronically Signed   By: Anner Crete M.D.   On: 08/17/2018 21:43   Dg Abd Portable 1 View  Result Date: 08/17/2018 CLINICAL DATA:  61 year old male status post NG tube placement. EXAM: PORTABLE ABDOMEN - 1 VIEW COMPARISON:  CT of the abdomen pelvis dated 08/17/2018 FINDINGS: An enteric tube is partially visualized with side-port in the gastric area and tip in the left upper abdomen likely in the proximal stomach. Dilated loops of small and large bowel noted in the visualized abdomen. IMPRESSION: Enteric tube with tip in the proximal stomach. Electronically Signed   By: Anner Crete M.D.   On: 08/17/2018 22:49    Anti-infectives: Anti-infectives (From admission, onward)   Start  Dose/Rate Route Frequency Ordered Stop   08/18/18 1000  piperacillin-tazobactam  (ZOSYN) IVPB 3.375 g     3.375 g 12.5 mL/hr over 240 Minutes Intravenous Every 8 hours 08/18/18 0858     08/17/18 2300  piperacillin-tazobactam (ZOSYN) IVPB 3.375 g     3.375 g 100 mL/hr over 30 Minutes Intravenous  Once 08/17/18 2254 08/18/18 0203      Assessment/Plan: s/p Procedure(s): FLEXIBLE SIGMOIDOSCOPY (N/A) Continue ng and bowel rest  Slow bowel prep over weekend. Plan for colonoscopy on Monday Sigmoid stricture and terminal ileum narrowing with history of lymphoma  LOS: 2 days    Autumn Messing III 08/19/2018

## 2018-08-19 NOTE — H&P (View-Only) (Signed)
[facility] Progress Note  CC:  Bowel obstruction  Subjective:  X-ray this AM shows continued small bowel dilatation.  He does feel better though with more noise in abdomen, feels less bloated.  Seems to have decreasing NGT output.  Objective:  Vital signs in last 24 hours: Temp:  [97.7 F (36.5 C)-98.2 F (36.8 C)] 97.7 F (36.5 C) (12/07 0359) Pulse Rate:  [96-108] 101 (12/07 0359) Resp:  [13-18] 16 (12/07 0359) BP: (120-130)/(80-89) 120/89 (12/07 0359) SpO2:  [92 %-97 %] 96 % (12/07 0359) Weight:  [90.1 kg] 90.1 kg (12/07 0700) Last BM Date: 08/13/18 General:  Alert, Well-developed, in NAD; NGT in place. Heart:  Slightly tachy; no murmurs Pulm:  CTAB.  No increased WOB. Abdomen:  Soft, minimally distended.  BS present.  Non-tender.   Extremities:  Without edema. Neurologic:  Alert and oriented x 4;  grossly normal neurologically. Psych:  Alert and cooperative. Normal mood and affect.  Intake/Output from previous day: 12/06 0701 - 12/07 0700 In: 1082.2 [I.V.:1042.1; IV Piggyback:40.1] Out: 625 [Urine:625]  Lab Results: Recent Labs    08/17/18 1738 08/18/18 0510 08/19/18 0607  WBC 7.2 5.1 5.8  HGB 9.6* 8.6* 9.3*  HCT 31.7* 29.1* 32.0*  PLT 214 203 213   BMET Recent Labs    08/17/18 1738 08/18/18 0510 08/19/18 0607  NA 136 136 140  K 4.3 3.8 3.8  CL 97* 101 102  CO2 29 26 26   GLUCOSE 113* 102* 89  BUN 13 12 12   CREATININE 0.64 0.54* 0.69  CALCIUM 8.7* 8.3* 8.5*   LFT Recent Labs    08/18/18 0510  PROT 5.0*  ALBUMIN 2.7*  AST 11*  ALT 10  ALKPHOS 47  BILITOT 0.4   Dg Abd 1 View  Result Date: 08/19/2018 CLINICAL DATA:  62 year old male with bowel obstruction. EXAM: ABDOMEN - 1 VIEW COMPARISON:  Abdominal radiograph dated 08/17/2018 and CT dated 08/17/2018 FINDINGS: There is persistent diffuse dilatation of air-filled loops of small bowel measuring up to 6 cm in the left lower quadrant. No free air. The osseous structures and  soft tissues are grossly unremarkable. IMPRESSION: Persistent small bowel dilatation.  Continued follow-up recommended. Electronically Signed   By: Anner Crete M.D.   On: 08/19/2018 05:40   Dg Abd 1 View  Result Date: 08/17/2018 CLINICAL DATA:  62 year old presenting with abdominal distention and absence of bowel movements over the past 5 days. Patient was recently released from the hospital after having a partial small bowel obstruction. Personal history of lymphoma. EXAM: ABDOMEN - 1 VIEW COMPARISON:  08/11/2018, 08/01/2018 and earlier. Chest x-ray 07/30/2018. FINDINGS: AP ERECT image was obtained. Multiple markedly distended loops of small bowel throughout the upper abdomen demonstrating air-fluid levels. Distal small bowel of normal caliber. Normal caliber colon with moderate stool burden in the ascending colon. No evidence of free intraperitoneal air. Linear scarring in the visualized lung bases, unchanged. IMPRESSION: 1. Recurrent partial small bowel obstruction. 2. No free intraperitoneal air. These results will be called to the ordering clinician or representative by the Radiologist Assistant, and communication documented in the PACS or zVision Dashboard. Electronically Signed   By: Evangeline Dakin M.D.   On: 08/17/2018 15:19   Ct Abdomen Pelvis W Contrast  Result Date: 08/17/2018 CLINICAL DATA:  62 year old male with concern for bowel obstruction. History of Burkitt's lymphoma and chemotherapy. EXAM: CT ABDOMEN AND PELVIS WITH CONTRAST TECHNIQUE: Multidetector CT imaging of the abdomen and pelvis was performed using the standard protocol  following bolus administration of intravenous contrast. CONTRAST:  120m ISOVUE-300 IOPAMIDOL (ISOVUE-300) INJECTION 61%, 382mOMNIPAQUE IOHEXOL 300 MG/ML SOLN COMPARISON:  CT of the abdomen pelvis dated 07/30/2018 and abdominal radiograph dated 08/17/2018 FINDINGS: Lower chest: There are bibasilar linear atelectasis/scarring. The tip of a central venous  catheter is partially visualized at the cavoatrial junction. No intra-abdominal free air.  Trace fluid adjacent to the liver. Hepatobiliary: Subcentimeter hypodense focus in the dome of the liver is too small to characterize. The liver is otherwise unremarkable. No intrahepatic biliary ductal dilatation. There is a focal area of thickening at the gallbladder fundus most consistent with adenomyomatosis. This was present on the prior studies and was not metabolically active on the PET. No calcified gallstone. Pancreas: Unremarkable. No pancreatic ductal dilatation or surrounding inflammatory changes. Spleen: Normal in size without focal abnormality. Adrenals/Urinary Tract: The adrenal glands are unremarkable. There is a 5 mm nonobstructing calculus in the interpolar aspect of the right kidney. No hydronephrosis. Additional smaller nonobstructing stone noted in the superior pole of the right kidney. Subcentimeter bilateral renal hypodense lesions are not characterized but likely represent cysts. There is no hydronephrosis on either side. There is symmetric enhancement and excretion of contrast by both kidneys. The urinary bladder is grossly unremarkable. Stomach/Bowel: There is segmental thickening of the sigmoid colon involving approximately 8 cm length. There is associated luminal narrowing and stricture of the sigmoid colon with moderate amount of retained stool within the colon proximal to this segment with an appearance of a moderate to high-grade obstruction. The thickened segment of the sigmoid colon may be related to post treatment changes and resulting fibrosis although involvement with a neoplastic process is not excluded. This is however somewhat new since PET-CT of 06/09/2018. There is stranding of the fat in the pelvis adjacent to the sigmoid colon with tethering of the terminal ileum and appendix consistent with adhesions and scarring. There may be a degree of low-grade or partial small-bowel obstruction  at the focus of tethering of the terminal ileum. There is fecalization of the terminal ileum. However, fecal content passes through the colon. The small bowel are dilated measuring up to 6 cm. The appendix is thickened and dilated with fecal content. It measures up to 2.5 cm in diameter. Multiple appendicular with noted in the distal appendix. The changes of the appendix are likely secondary to post radiation treatment. However, acute appendicitis is not entirely excluded. Clinical correlation is recommended. Vascular/Lymphatic: The abdominal aorta and IVC are grossly unremarkable. No portal venous gas. No adenopathy. Reproductive: The prostate and seminal vesicles are grossly unremarkable. Other: None Musculoskeletal: Degenerative changes of the spine. No acute osseous pathology. IMPRESSION: 1. Segmental thickening of the sigmoid colon with associated luminal narrowing and stricture and resulting obstruction at the level of the sigmoid colon. Findings likely related to post treatment changes although a colonic mass is not entirely excluded. 2. Diffusely dilated small bowel may represent an ileus or a degree of small-bowel obstruction at the level of the terminal ileum secondary to adhesions. Stool however passes into the colon. A small-bowel series may provide better evaluation of the degree of potential obstruction in the terminal ileum. 3. Dilated and thickened appendix, likely related to post treatment changes. Acute appendicitis is not entirely excluded. Clinical correlation is recommended. Electronically Signed   By: ArAnner Crete.D.   On: 08/17/2018 21:43   Dg Abd Portable 1 View  Result Date: 08/17/2018 CLINICAL DATA:  [age]ear old male status post NG tube placement. EXAM: PORTABLE ABDOMEN -  1 VIEW COMPARISON:  CT of the abdomen pelvis dated 08/17/2018 FINDINGS: An enteric tube is partially visualized with side-port in the gastric area and tip in the left upper abdomen likely in the proximal stomach.  Dilated loops of small and large bowel noted in the visualized abdomen. IMPRESSION: Enteric tube with tip in the proximal stomach. Electronically Signed   By: Anner Crete M.D.   On: 08/17/2018 22:49   Assessment / Plan: 1. 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  Completed chemo late October.  Since then patient has had 2 admissions for SBO though CT scans and PET scans have both previously demonstrated wall thickening of sigmoid as well.  Of note, colonoscopy in July negative for any sigmoid findings . Now with definite obstruction at level of sigmoid colon.  Not clear if this is intraluminal obstruction (lymphoma).  Extrinsic compression is a possibility.  Scarring from diverticulitis?  Infectious seems less likely. -continue NGT with LIS -am abdominal films. -will likely need at least flex sig at some point once obstruction resolved/improved.  2. DVT / PE. Home Xarelto on hold. On heparin gtt.  -will need to hold heparin gtt for procedure when we plan for that.    LOS: 2 days   Laban Emperor. Zehr  08/19/2018, 9:02 AM  GI ATTENDING  Interval history and data reviewed.  Patient seen and examined.  Wife and sister at bedside.  Still with significant bowel dilation on imaging today.  This patient cannot safely prep for colonoscopy at this time.  We will administer additional enemas.  I will set him up for "gentle flexible sigmoidoscopy" tomorrow to see if there is additional information to gain in evaluating the stricture of sigmoid colon region.  I do suspect this patient is going to come to surgery at some point regarding management of stricturing of the colon and/or small bowel.  Docia Chuck. Geri Seminole., M.D. [facility] Division of Gastroenterology

## 2018-08-20 ENCOUNTER — Inpatient Hospital Stay (HOSPITAL_COMMUNITY): Payer: BLUE CROSS/BLUE SHIELD

## 2018-08-20 ENCOUNTER — Encounter (HOSPITAL_COMMUNITY): Payer: Self-pay | Admitting: *Deleted

## 2018-08-20 ENCOUNTER — Encounter (HOSPITAL_COMMUNITY)
Admission: EM | Disposition: A | Discharge status: Home Health | Payer: Self-pay | Source: Home / Self Care | Attending: Internal Medicine

## 2018-08-20 DIAGNOSIS — R935 Abnormal findings on diagnostic imaging of other abdominal regions, including retroperitoneum: Secondary | ICD-10-CM

## 2018-08-20 HISTORY — PX: COLONOSCOPY: SHX5424

## 2018-08-20 LAB — BASIC METABOLIC PANEL
Anion gap: 8 (ref 5–15)
BUN: 11 mg/dL (ref 8–23)
CALCIUM: 8 mg/dL — AB (ref 8.9–10.3)
CO2: 27 mmol/L (ref 22–32)
Chloride: 103 mmol/L (ref 98–111)
Creatinine, Ser: 0.58 mg/dL — ABNORMAL LOW (ref 0.61–1.24)
GFR calc Af Amer: >60 mL/min (ref >60)
Glucose, Bld: 113 mg/dL — ABNORMAL HIGH (ref 70–99)
Potassium: 3.4 mmol/L — ABNORMAL LOW (ref 3.5–5.1)
Sodium: 138 mmol/L (ref 135–145)

## 2018-08-20 LAB — CBC WITH DIFFERENTIAL/PLATELET
Abs Immature Granulocytes: 0 10*3/uL (ref 0.00–0.07)
BASOS PCT: 0 %
Band Neutrophils: 5 %
Basophils Absolute: 0 10*3/uL (ref 0.0–0.1)
Eosinophils Absolute: 0 10*3/uL (ref 0.0–0.5)
Eosinophils Relative: 0 %
HCT: 28.9 % — ABNORMAL LOW (ref 39.0–52.0)
Hemoglobin: 8.5 g/dL — ABNORMAL LOW (ref 13.0–17.0)
Lymphocytes Relative: 32 %
Lymphs Abs: 1.6 10*3/uL (ref 0.7–4.0)
MCH: 29.8 pg (ref 26.0–34.0)
MCHC: 29.4 g/dL — ABNORMAL LOW (ref 30.0–36.0)
MCV: 101.4 fL — ABNORMAL HIGH (ref 80.0–100.0)
Monocytes Absolute: 0.2 10*3/uL (ref 0.1–1.0)
Monocytes Relative: 4 %
NEUTROS ABS: 3.2 10*3/uL (ref 1.7–7.7)
NRBC: 0 % (ref 0.0–0.2)
Neutrophils Relative %: 59 %
Platelets: 224 10*3/uL (ref 150–400)
RBC: 2.85 MIL/uL — ABNORMAL LOW (ref 4.22–5.81)
RDW: 18.8 % — AB (ref 11.5–15.5)
WBC: 5 10*3/uL (ref 4.0–10.5)

## 2018-08-20 LAB — GLUCOSE, CAPILLARY
GLUCOSE-CAPILLARY: 76 mg/dL (ref 70–99)
Glucose-Capillary: 104 mg/dL — ABNORMAL HIGH (ref 70–99)
Glucose-Capillary: 109 mg/dL — ABNORMAL HIGH (ref 70–99)
Glucose-Capillary: 109 mg/dL — ABNORMAL HIGH (ref 70–99)
Glucose-Capillary: 109 mg/dL — ABNORMAL HIGH (ref 70–99)
Glucose-Capillary: 115 mg/dL — ABNORMAL HIGH (ref 70–99)
Glucose-Capillary: 116 mg/dL — ABNORMAL HIGH (ref 70–99)
Glucose-Capillary: 59 mg/dL — ABNORMAL LOW (ref 70–99)
Glucose-Capillary: 70 mg/dL (ref 70–99)
Glucose-Capillary: 72 mg/dL (ref 70–99)
Glucose-Capillary: 78 mg/dL (ref 70–99)
Glucose-Capillary: 83 mg/dL (ref 70–99)
Glucose-Capillary: 93 mg/dL (ref 70–99)
Glucose-Capillary: 96 mg/dL (ref 70–99)

## 2018-08-20 LAB — APTT
APTT: 189 s — AB (ref 24–36)
aPTT: 76 seconds — ABNORMAL HIGH (ref 24–36)

## 2018-08-20 LAB — HEPARIN LEVEL (UNFRACTIONATED): Heparin Unfractionated: 0.59 IU/mL (ref 0.30–0.70)

## 2018-08-20 LAB — PREALBUMIN: PREALBUMIN: 6.2 mg/dL — AB (ref 18–38)

## 2018-08-20 SURGERY — COLONOSCOPY
Anesthesia: Moderate Sedation

## 2018-08-20 MED ORDER — MIDAZOLAM HCL (PF) 5 MG/ML IJ SOLN
INTRAMUSCULAR | Status: AC
  Filled 2018-08-20: qty 2

## 2018-08-20 MED ORDER — MIDAZOLAM HCL (PF) 10 MG/2ML IJ SOLN
INTRAMUSCULAR | Status: DC | PRN
  Administered 2018-08-20: 1 mg via INTRAVENOUS
  Administered 2018-08-20 (×2): 2 mg via INTRAVENOUS
  Administered 2018-08-20: 1 mg via INTRAVENOUS

## 2018-08-20 MED ORDER — SODIUM CHLORIDE 0.9 % IV SOLN: INTRAVENOUS | Status: DC

## 2018-08-20 MED ORDER — HEPARIN (PORCINE) 25000 UT/250ML-% IV SOLN
1850.0000 [IU]/h | INTRAVENOUS | Status: DC
  Administered 2018-08-20 – 2018-08-22 (×6): 1850 [IU]/h via INTRAVENOUS
  Filled 2018-08-20 (×7): qty 250

## 2018-08-20 MED ORDER — FENTANYL CITRATE (PF) 100 MCG/2ML IJ SOLN
INTRAMUSCULAR | Status: DC | PRN
  Administered 2018-08-20 (×2): 25 ug via INTRAVENOUS

## 2018-08-20 MED ORDER — FENTANYL CITRATE (PF) 100 MCG/2ML IJ SOLN
INTRAMUSCULAR | Status: AC
  Filled 2018-08-20: qty 2

## 2018-08-20 NOTE — Progress Notes (Signed)
PHARMACY - HEPARIN (brief follow-up note)  Patient on heparin for h/o PE and DVT while home Eliquis on hold pending procedures.  aPTT has been slow to increase (last aPTT = 53 sec) and most recent heparin rate increase was to 2100 units/hr  23:00 aPTT = 189 sec with heparin @ 2100 units/hr (aPTT goal 66-102 sec)  Plan: Will hold heparin infusion x 1 hr and then resume at a decreased rate of 1850 units/hr  Check aPTT, HL and CBC 6 hr after heparin resumed at decreased rate.  Leone Haven, PharmD 08/20/2018

## 2018-08-20 NOTE — Progress Notes (Signed)
Tap water enema given,pt.tolerated well.

## 2018-08-20 NOTE — Progress Notes (Signed)
[facility] for heparin Indication: hx pulmonary embolus and DVT (home Eliquis on hold)  Allergies  Allergen Reactions  . Ciprofloxacin Other (See Comments)    Leg tingling    Patient Measurements: Height: 5' 9"  (175.3 cm) Weight: 198 lb 10.2 oz (90.1 kg) IBW/kg (Calculated) : 70.7 Heparin Dosing Weight: 89 kg  Vital Signs: Temp: 97.9 F (36.6 C) (12/08 1217) Temp Source: Oral (12/08 1217) BP: 123/80 (12/08 1217) Pulse Rate: 105 (12/08 1217)  Labs: Recent Labs    08/18/18 0510  08/18/18 1803 08/19/18 0607 08/19/18 1515 08/19/18 2257 08/20/18 0417 08/20/18 1021 08/20/18 1121  HGB 8.6*  --   --  9.3*  --   --  8.5*  --   --   HCT 29.1*  --   --  32.0*  --   --  28.9*  --   --   PLT 203  --   --  213  --   --  224  --   --   APTT  --    < > 32 42* 53* 189*  --  76*  --   HEPARINUNFRC  --    < > 0.99* 0.87*  --   --   --   --  0.59  CREATININE 0.54*  --   --  0.69  --   --  0.58*  --   --    < > = values in this interval not displayed.    Estimated Creatinine Clearance: 106.3 mL/min (A) (by C-G formula based on SCr of 0.58 mg/dL (L)).   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  Abd CT on 12/5 showed bowel obstruction. Patient was transitioned from Eliquis to heparin drip on admission since patient is NPO and in case surgical intervention is needed.   Baseline INR, aPTT: not done  Prior anticoagulation: Eliquis 5 mg bid, LD 12/4 at 1800  Significant events:  Today, 08/20/2018:  CBC: Hgb low but stable, Plt WNL  Most recent aPTT now therapeutic on 1850 units/hr; patient had very high time overnight on 2100 units/hr, but based on prior times, this was likely a spurious result. Anti-Xa level starting to correlate with aPTT  Patient underwent flex sig while  on heparin this AM; not turned off by GI and no mention of bleeding or other complications.  No bleeding or infusion issues per nursing  Goal of Therapy: Heparin level 0.3-0.7 units/ml aPTT 66-102 seconds Monitor platelets by anticoagulation protocol: Yes  Plan:  Continue heparin IV infusion at 1850 units/hr  Daily CBC and heparin level, aPTT tomorrow AM  Monitor for signs of bleeding or thrombosis  F/u GI plans as patient may need surgery in the near future; will need heparin stopped appropriately   Reuel Boom, PharmD, BCPS [phone] 08/20/2018, 1:07 PM

## 2018-08-20 NOTE — Progress Notes (Signed)
CRITICAL VALUE ALERt Critical Value:  PTT 189  Date & Time Notied:  08/20/18 .0002  Provider Notified: hospitalist  B[place]heimer notified via [place]..   Orders Received/Actions taken: waiting for orders.

## 2018-08-20 NOTE — Interval H&P Note (Signed)
History and Physical Interval Note:  08/20/2018 8:40 AM  Mark Frey  has presented today for surgery, with the diagnosis of Sigmoid obstruction  The various methods of treatment have been discussed with the patient and family. After consideration of risks, benefits and other options for treatment, the patient has consented to  Procedure(s): FLEXIBLE SIGMOIDOSCOPY (N/A) as a surgical intervention .  The patient's history has been reviewed, patient examined, no change in status, stable for surgery.  I have reviewed the patient's chart and labs.  Questions were answered to the patient's satisfaction.     Scarlette Shorts

## 2018-08-20 NOTE — Op Note (Addendum)
[facility] Patient Name: Mark Frey Procedure Date: 08/20/2018 MRN: [id number] Attending MD: Docia Chuck. Henrene Pastor , MD Date of Birth: 1956-04-26 CSN: [id number] Age: 62 Admit Type: Inpatient Procedure:                Colonoscopy Indications:              Abnormal CT of the GI tract Providers:                Docia Chuck. Henrene Pastor, MD, Cleda Daub, RN, Laverda Sorenson, Technician Referring MD:             [facility] hospitalist Medicines:                Monitored Anesthesia Care, Midazolam 6 mg IV,                            Fentanyl 50 micrograms IV Complications:            No immediate complications. Estimated blood loss:                            None. Estimated Blood Loss:     Estimated blood loss: none. Procedure:                Pre-Anesthesia Assessment:                           - Prior to the procedure, a History and Physical                            was performed, and patient medications and                            allergies were reviewed. The patient's tolerance of                            previous anesthesia was also reviewed. The risks                            and benefits of the procedure and the sedation                            options and risks were discussed with the patient.                            All questions were answered, and informed consent                            was obtained. Prior Anticoagulants: The patient has                            taken Eliquis (apixaban). ASA Grade Assessment: III                            -  A patient with severe systemic disease. After                            reviewing the risks and benefits, the patient was                            deemed in satisfactory condition to undergo the                            procedure.                           After obtaining informed consent, the colonoscope                            was passed under direct vision. Throughout the              procedure, the patient's blood pressure, pulse, and                            oxygen saturations were monitored continuously. The                            [id number]-[id number] ([id number]) Olympus peds colonoscope was                            introduced through the anus and advanced to the the                            cecum, identified by the ileocecal valve. After                            obtaining informed consent, the colonoscope was                            passed under direct vision. Throughout the                            procedure, the patient's blood pressure, pulse, and                            oxygen saturations were monitored continuously. No                            anatomical landmarks were photographed DO TO                            TECHNICAL DIFFICULTIES WITH THE IMAGING SYSTEM. The                            quality of the bowel preparation was poor. The                            colonoscopy was performed without difficulty. The  patient tolerated the procedure well. The bowel                            preparation used was Fleet enema. Scope In: Scope Out: Findings:      An extrinsic severe stenosis measuring 10 cm (in length) with no       appreciable luminal opening was found in the sigmoid colon and was       traversed with gradual gentle pressure. Above this area the colon was       somewhat dilated. The area was poorly prepped but the scope was       gradually advanced to the level of the cecum and ileocecal valve. No       evidence for colonic obstructive process other than the abnormal sigmoid       colon region. Impression:               1. High-grade extrinsic compression of the sigmoid                            colon as described                           2. Colonoscopy completed to the level of the cecum                            without additional obstructing lesions noted.                            Examination  of the mucosa was compromised by poor                            preparation. This patient could not properly prep                            for colonoscopy given ileal and colonic obstructive                            processes. Moderate Sedation:      Moderate (conscious) sedation was administered by the endoscopy nurse       and supervised by the endoscopist. The patient's oxygen saturation,       heart rate, blood pressure and response to care were monitored. Total       physician intraservice time was 15 minutes. Recommendation:           1. Continue NG decompression                           2. I believe that this patient will require surgery                            to address at least the extrinsic sigmoid colon                            stenosis and possibly the suspected process in the  region of the ileum                           3. Discussed with patient and family.                           GI will sign off but we certainly are available if                            we can be of further assistance or for any                            questions. Thank you Procedure Code(s):        --- Professional ---                           [phone], Colonoscopy, flexible; diagnostic, including                            collection of specimen(s) by brushing or washing,                            when performed (separate procedure)                           G0500, Moderate sedation services provided by the                            same physician or other qualified health care                            professional performing a gastrointestinal                            endoscopic service that sedation supports,                            requiring the presence of an independent trained                            observer to assist in the monitoring of the                            patient's level of consciousness and physiological                             status; initial 15 minutes of intra-service time;                            patient age 62 years or older (additional time may                            be reported with [phone], as appropriate) Diagnosis Code(s):        --- Professional ---  K56.699, Other intestinal obstruction unspecified                            as to partial versus complete obstruction                           R93.3, Abnormal findings on diagnostic imaging of                            other parts of digestive tract CPT copyright 2018 [employer]. All rights reserved. The codes documented in this report are preliminary and upon coder review may  be revised to meet current compliance requirements. Docia Chuck. Henrene Pastor, MD 08/20/2018 9:28:32 AM This report has been signed electronically. Number of Addenda: 0

## 2018-08-20 NOTE — Progress Notes (Signed)
PROGRESS NOTE  Mark Frey JSE:[id number] DOB: Apr 14, 1956 DOA: 08/17/2018 PCP: Mark Olp, MD  HPI/Recap of past 24 hours: HPI from Dr Jenny Reichmann is a 62 y.o. male with history of lymphoma, DM, Hx of DVT, who was recently admitted for small bowel obstruction related to the lymphoma treated conservatively presents to the ER because he has not moved bowels for last 7 days.  Patient also noticed increasing bowel distention. Denies any vomiting or abdominal pain. In the ER CAT scan shows bowel obstruction for which patient has been placed on NG tube suction.  On-call general surgeon has been consulted and admitted for further management.   Today, patient denies any new complaints.  Assessment/Plan: Principal Problem:   Bowel obstruction in setting of lymphoma and sigmoid stricture Active Problems:   Morbid obesity ([place])   Controlled diabetes mellitus type II without complication (HCC)   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HCC)   Deep vein thrombosis (DVT) of femoral vein of right lower extremity (HCC)   Protein-calorie malnutrition, severe   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HCC)   Abnormal CT of the abdomen  Recurrent small bowel obstruction CT abdomen/pelvis showed: Diffusely dilated small bowel may represent an ileus or a degree of SBO at the level of the terminal ileum secondary to adhesions. Segmental thickening of the sigmoid colon with associated luminal narrowing and stricture and resulting obstruction at the level of the sigmoid colon. Findings likely related to post treatment changes although a colonic mass is not entirely excluded General surgery on board, plan for surgery during this admission GI on board, appreciate recs: Had colonoscopy on 08/20/18 showed high-grade extrinsic compression of the sigmoid colon. No mass seen. Rec possible surgery   N.p.o., IV fluids, NG tube, pain management IV Zosyn as per general surgery Monitor closely  Diabetes mellitus type  2 Had an episode of hypoglycemia 08/19/18, will change IVF to d5W/1/2NS  SSI, Accu-Cheks, hypoglycemic protocol  History of DVT Hold home apixaban, continue heparin drip pending procedures  High-grade cell lymphoma Diagnosed in July, completed chemo late October Followed by oncologist [name]         Malnutrition Type:  Nutrition Problem: Severe Malnutrition Etiology: chronic illness, cancer and cancer related treatments   Malnutrition Characteristics:  Signs/Symptoms: energy intake < or equal to 75% for > or equal to 1 month, severe fat depletion, percent weight loss, mild muscle depletion   Nutrition Interventions:  Interventions: Ensure Enlive (each supplement provides 350kcal and 20 grams of protein), Refer to RD note for recommendations, MVI    Estimated body mass index is 29.33 kg/m as calculated from the following:   Height as of this encounter: 5' 9"  (1.753 m).   Weight as of this encounter: 90.1 kg.     Code Status: Full  Family Communication: Wife at bedside  Disposition Plan: To be determined pending work up   Consultants:  General surgery  GI  Procedures:  None  Antimicrobials:  IV Zosyn  DVT prophylaxis: Heparin drip   Objective: Vitals:   08/20/18 0920 08/20/18 0925 08/20/18 0930 08/20/18 1217  BP: 109/64  119/68 123/80  Pulse: (!) 110 (!) 108 (!) 110 (!) 105  Resp: 19 18 17 18   Temp:    97.9 F (36.6 C)  TempSrc:    Oral  SpO2: 99% 97% 98% 98%  Weight:      Height:        Intake/Output Summary (Last 24 hours) at 08/20/2018 1551 Last data filed  at 08/20/2018 1221 Gross per 24 hour  Intake 3060.15 ml  Output 1400 ml  Net 1660.15 ml   Filed Weights   08/17/18 1555 08/19/18 0700  Weight: 90.3 kg 90.1 kg    Exam  General: NAD   Cardiovascular: S1, S2 present  Respiratory: CTAB  Abdomen: Soft, nontender, +distended, hypoactive bowel sounds, NGT in place  Musculoskeletal: No bilateral pedal edema noted  Skin:  Normal  Psychiatry: Normal mood   Data Reviewed: CBC: Recent Labs  Lab 08/17/18 1738 08/18/18 0510 08/19/18 0607 08/20/18 0417  WBC 7.2 5.1 5.8 5.0  NEUTROABS 4.1 2.8 3.4 3.2  HGB 9.6* 8.6* 9.3* 8.5*  HCT 31.7* 29.1* 32.0* 28.9*  MCV 98.4 100.0 101.6* 101.4*  PLT 214 203 213 109   Basic Metabolic Panel: Recent Labs  Lab 08/17/18 1738 08/18/18 0510 08/19/18 0607 08/20/18 0417  NA 136 136 140 138  K 4.3 3.8 3.8 3.4*  CL 97* 101 102 103  CO2 29 26 26 27   GLUCOSE 113* 102* 89 113*  BUN 13 12 12 11   CREATININE 0.64 0.54* 0.69 0.58*  CALCIUM 8.7* 8.3* 8.5* 8.0*   GFR: Estimated Creatinine Clearance: 106.3 mL/min (A) (by C-G formula based on SCr of 0.58 mg/dL (L)). Liver Function Tests: Recent Labs  Lab 08/17/18 1738 08/18/18 0510  AST 13* 11*  ALT 13 10  ALKPHOS 56 47  BILITOT 0.5 0.4  PROT 5.7* 5.0*  ALBUMIN 2.7* 2.7*   Recent Labs  Lab 08/17/18 1738  LIPASE 19   No results for input(s): AMMONIA in the last 168 hours. Coagulation Profile: No results for input(s): INR, PROTIME in the last 168 hours. Cardiac Enzymes: No results for input(s): CKTOTAL, CKMB, CKMBINDEX, TROPONINI in the last 168 hours. BNP (last 3 results) No results for input(s): PROBNP in the last 8760 hours. HbA1C: No results for input(s): HGBA1C in the last 72 hours. CBG: Recent Labs  Lab 08/20/18 0006 08/20/18 0108 08/20/18 0411 08/20/18 0957 08/20/18 1219  GLUCAP 96 93 109* 116* 115*   Lipid Profile: No results for input(s): CHOL, HDL, LDLCALC, TRIG, CHOLHDL, LDLDIRECT in the last 72 hours. Thyroid Function Tests: No results for input(s): TSH, T4TOTAL, FREET4, T3FREE, THYROIDAB in the last 72 hours. Anemia Panel: No results for input(s): VITAMINB12, FOLATE, FERRITIN, TIBC, IRON, RETICCTPCT in the last 72 hours. Urine analysis:    Component Value Date/Time   COLORURINE AMBER (A) 08/17/2018 1624   APPEARANCEUR CLEAR 08/17/2018 1624   LABSPEC >1.046 (H) 08/17/2018 1624    PHURINE 5.0 08/17/2018 1624   GLUCOSEU NEGATIVE 08/17/2018 1624   HGBUR NEGATIVE 08/17/2018 1624   HGBUR negative 01/20/2009 0840   BILIRUBINUR NEGATIVE 08/17/2018 1624   BILIRUBINUR Small 01/30/2018 1652   KETONESUR NEGATIVE 08/17/2018 1624   PROTEINUR NEGATIVE 08/17/2018 1624   UROBILINOGEN 2.0 (A) 01/30/2018 1652   UROBILINOGEN 0.2 01/20/2009 0840   NITRITE NEGATIVE 08/17/2018 1624   LEUKOCYTESUR NEGATIVE 08/17/2018 1624   Sepsis Labs: @LABRCNTIP (procalcitonin:4,lacticidven:4)  )No results found for this or any previous visit (from the past 240 hour(s)).    Studies: No results found.  Scheduled Meds: . insulin aspart  0-9 Units Subcutaneous Q4H    Continuous Infusions: . dextrose 5 % and 0.45% NaCl 100 mL/hr at 08/20/18 0223  . heparin 1,850 Units/hr (08/20/18 0219)  . piperacillin-tazobactam (ZOSYN)  IV 3.375 g (08/20/18 1048)     LOS: 3 days     Alma Friendly, MD [facility]  If 7PM-7AM, please contact night-coverage [email or web address] 08/20/2018, 3:51  PM

## 2018-08-20 NOTE — Progress Notes (Signed)
HEMATOLOGY/ONCOLOGY CLINIC NOTE  Date of Service: 08/20/18    Patient Care Team: [name], MD as PCP - General (Family Medicine)  CHIEF COMPLAINTS/PURPOSE OF CONSULTATION:  F/u for continued Mx of Burkitts lymphoma  HISTORY OF PRESENTING ILLNESS:   Mark Frey is a wonderful 62 y.o. male who has been referred to [place] by my colleague Dr. 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 He is accompanied today by his wife. The pt reports that he is doing well overall.   The pt started R-EPOCH every 3 weeks with neulasta support on 03/17/18. He presented to the ED with right leg swelling, found to be a DVT and bilateral PE, which resulted in the incidental finding of a lower abdomen tumor measuring 18.7 x 18.5 x 17.8 cm. He denies any abdominal pains or changes in his bowel habits prior to this finding. He first noted blood in the stools 4-5 days prior to appearing to the ED.  The pt reports that he has been taking 165m Lovenox injections once each day, except for the days in which he had blood in the stools. He hasn't had blood in his stools for the last 7 days.   He has had GI bleed related to bowel involvement with his lymphoma - this is now resolving and his hgb is stabilizing.  He notes that his scrotum and leg swelling has decreased. He denies having CP or SOB at any point from his PE.   Most recent lab results (04/06/18) of CBC w/diff, CMP, Reticulocytes  is as follows: all values are WNL except for WBC at 15.3k, RBC at 3.65, HGB at 10.5, HCT at 31.4, RDW at 17.8, PLT at 640k, ANC at 12.7k, Monocytes abs at 1.6k, Glucose at 162, Total Protein at 6.2, Albumin at 3.1, Total bilirubin at <0.2, Retic ct pct at 2.5%. Uric acid 04/06/18 was low at 3.0 LDH 04/06/18 elevated at 251  On review of systems, pt reports remaining right leg swelling, blood in the stools 7 days ago, two  bowel movements each day, left leg swelling, improved scrotal swelling, eating well, and denies problems with his port, abdominal pains, constipation, diarrhea, jaw pain, pain along the spine, problems passing urine, and any other symptoms.  Interval History:   DDONNEL VENUTOreturns today for post hospitalization f/u . He has had 2 recent hospitalization for ileus/ partial bowel obstruction that has now resolved.AXR today improved. No nausea/vomitting or abdominal pain today. Still with loose stools.  On review of systems, pt reports weak appetite, lack of energy, normal bowel movements, and denies eating well, staying active, signs of infections, leg swelling, new neuropathy, mouth sores, abdominal pains, and any other symptoms.    MEDICAL HISTORY:  Past Medical History:  Diagnosis Date  . ALLERGIC RHINITIS   . Cancer ([facility]    Lymphoma   . Diabetes mellitus   . Hyperlipidemia     SURGICAL HISTORY: Past Surgical History:  Procedure Laterality Date  . BIOPSY  03/15/2018   Procedure: BIOPSY;  Surgeon: JMilus Banister MD;  Location: [facility];  Service: Endoscopy;;  . CLEFT PALATE REPAIR    . COLONOSCOPY N/A 03/15/2018   Procedure: COLONOSCOPY;  Surgeon: JMilus Banister MD;  Location: [facility];  Service: Endoscopy;  Laterality: N/A;  . deviated septum repair     slight improvement  . ESOPHAGOGASTRODUODENOSCOPY N/A 03/15/2018   Procedure:  ESOPHAGOGASTRODUODENOSCOPY (EGD);  Surgeon: Milus Banister, MD;  Location: [name];  Service: Endoscopy;  Laterality: N/A;  . IR IMAGING GUIDED PORT INSERTION  03/17/2018  . TONSILLECTOMY      SOCIAL HISTORY: Social History   Socioeconomic History  . Marital status: Married    Spouse name: Mark Frey  . Number of children: 0  . Years of education: Not on file  . Highest education level: Not on file  Occupational History  . Not on file  Social Needs  . Financial resource strain: Not hard at all  . Food insecurity:    Worry: Never  true    Inability: Never true  . Transportation needs:    Medical: No    Non-medical: No  Tobacco Use  . Smoking status: Never Smoker  . Smokeless tobacco: Never Used  Substance and Sexual Activity  . Alcohol use: Yes    Comment: occasional  . Drug use: No  . Sexual activity: Yes  Lifestyle  . Physical activity:    Days per week: 0 days    Minutes per session: 0 min  . Stress: Not at all  Relationships  . Social connections:    Talks on phone: More than three times a week    Gets together: More than three times a week    Attends religious service: 1 to 4 times per year    Active member of club or organization: No    Attends meetings of clubs or organizations: Never    Relationship status: Married  . Intimate partner violence:    Fear of current or ex partner: No    Emotionally abused: No    Physically abused: No    Forced sexual activity: No  Other Topics Concern  . Not on file  Social History Narrative   Married 1985. No kids. 4 small dogs.       Works in Financial trader, residential      Hobbies: work on cars, Haematologist, exercise as able    FAMILY HISTORY: Family History  Problem Relation Age of Onset  . Lung cancer Mother        smoker  . Brain cancer Mother        metastasis  . AAA (abdominal aortic aneurysm) Father        smoker    ALLERGIES:  is allergic to ciprofloxacin.  MEDICATIONS:  No current facility-administered medications for this visit.    No current outpatient medications on file.   Facility-Administered Medications Ordered in Other Visits  Medication Dose Route Frequency Provider Last Rate Last Dose  . acetaminophen (TYLENOL) tablet 650 mg  650 mg Oral Q6H PRN [name], MD       Or  . acetaminophen (TYLENOL) suppository 650 mg  650 mg Rectal Q6H PRN [name], MD      . dextrose 5 %-0.45 % sodium chloride infusion   Intravenous Continuous Alma Friendly, MD 100 mL/hr at 08/20/18 0223    . heparin  ADULT infusion 100 units/mL (25000 units/255m sodium chloride 0.45%)  1,850 Units/hr Intravenous Continuous Poindexter, Leann T, RPH 18.5 mL/hr at 08/20/18 1622 1,850 Units/hr at 08/20/18 1622  . insulin aspart (novoLOG) injection 0-9 Units  0-9 Units Subcutaneous Q4H KRise Patience MD      . morphine 2 MG/ML injection 1 mg  1 mg Intravenous Q2H PRN KRise Patience MD      . ondansetron ([facility] tablet 4 mg  4 mg Oral Q6H PRN KHal Hope  Doreatha Lew, MD       Or  . ondansetron [facility]) injection 4 mg  4 mg Intravenous Q6H PRN [name], MD      . phenol (CHLORASEPTIC) mouth spray 1 spray  1 spray Mouth/Throat PRN Alma Friendly, MD      . piperacillin-tazobactam (ZOSYN) IVPB 3.375 g  3.375 g Intravenous Q8H Earnstine Regal, PA-C 12.5 mL/hr at 08/20/18 1734 3.375 g at 08/20/18 1734  . sodium chloride flush (NS) 0.9 % injection 10-40 mL  10-40 mL Intracatheter PRN Alma Friendly, MD        REVIEW OF SYSTEMS:    .10 Point review of Systems was done is negative except as noted above.  PHYSICAL EXAMINATION: ECOG PERFORMANCE STATUS: 1 - Symptomatic but completely ambulatory  Vitals:   08/11/18 1333  BP: 131/83  Pulse: (!) 124  Resp: 16  Temp: 98.2 F (36.8 C)  SpO2: 100%   Filed Weights   08/11/18 1333  Weight: 197 lb 4.8 oz (89.5 kg)   .Body mass index is 29.14 kg/m.  [name] GENERAL:alert, in no acute distress and comfortable SKIN: no acute rashes, no significant lesions EYES: conjunctiva are pink and non-injected, sclera anicteric OROPHARYNX: MMM, no exudates, no oropharyngeal erythema or ulceration NECK: supple, no JVD LYMPH:  no palpable lymphadenopathy in the cervical, axillary or inguinal regions LUNGS: clear to auscultation b/l with normal respiratory effort HEART: regular rate & rhythm ABDOMEN:  normoactive bowel sounds , non tender, not distended. Extremity: no pedal edema PSYCH: alert & oriented x 3 with fluent speech NEURO: no focal  motor/sensory deficits  LABORATORY DATA:  I have reviewed the data as listed Component     Latest Ref Rng & Units 08/11/2018  WBC     4.0 - 10.5 K/uL 6.9  RBC     4.22 - 5.81 MIL/uL 3.25 (L)  Hemoglobin     13.0 - 17.0 g/dL 9.7 (L)  HCT     39.0 - 52.0 % 30.8 (L)  MCV     80.0 - 100.0 fL 94.8  MCH     26.0 - 34.0 pg 29.8  MCHC     30.0 - 36.0 g/dL 31.5  RDW     11.5 - 15.5 % 18.4 (H)  Platelets     150 - 400 K/uL 205  nRBC     0.0 - 0.2 % 0.0  Neutrophils     % 11  NEUT#     1.7 - 7.7 K/uL 0.7 (L)  Lymphocytes     % 67  Lymphocyte #     0.7 - 4.0 K/uL 4.6 (H)  Monocytes Relative     % 20  Monocyte #     0.1 - 1.0 K/uL 1.3 (H)  Eosinophil     % 1  Eosinophils Absolute     0.0 - 0.5 K/uL 0.0  Basophil     % 0  Basophils Absolute     0.0 - 0.1 K/uL 0.0  WBC Morphology      VARIANT LYMPHOCYTES PRESENT  Immature Granulocytes     % 1  Abs Immature Granulocytes     0.00 - 0.07 K/uL 0.04  Polychromasia      PRESENT  Sodium     135 - 145 mmol/L 134 (L)  Potassium     3.5 - 5.1 mmol/L 3.5  Chloride     98 - 111 mmol/L 100  CO2     22 - 32 mmol/L  23  Glucose     70 - 99 mg/dL 127 (H)  BUN     8 - 23 mg/dL 7 (L)  Creatinine     0.61 - 1.24 mg/dL 0.60 (L)  Calcium     8.9 - 10.3 mg/dL 8.9  Total Protein     6.5 - 8.1 g/dL 5.8 (L)  Albumin     3.5 - 5.0 g/dL 2.5 (L)  AST     15 - 41 U/L 20  ALT     0 - 44 U/L 16  Alkaline Phosphatase     38 - 126 U/L 53  Total Bilirubin     0.3 - 1.2 mg/dL 0.4  GFR, Est Non African American     >60 mL/min >60  GFR, Est African American     >60 mL/min >60  Anion gap     5 - 15 11  Magnesium     1.7 - 2.4 mg/dL 1.7  03/17/18 Cytogenetics:   03/17/18 Colon Bx:   03/13/18 Peritoneum Biopsy:     RADIOGRAPHIC STUDIES:  I have personally reviewed the radiological images as listed and agreed with the findings in the report. Dg Abd 1 View  Result Date: 08/19/2018 CLINICAL DATA:  62 year old male with bowel  obstruction. EXAM: ABDOMEN - 1 VIEW COMPARISON:  Abdominal radiograph dated 08/17/2018 and CT dated 08/17/2018 FINDINGS: There is persistent diffuse dilatation of air-filled loops of small bowel measuring up to 6 cm in the left lower quadrant. No free air. The osseous structures and soft tissues are grossly unremarkable. IMPRESSION: Persistent small bowel dilatation.  Continued follow-up recommended. Electronically Signed   By: Anner Crete M.D.   On: 08/19/2018 05:40   Dg Abd 1 View  Result Date: 08/17/2018 CLINICAL DATA:  62 year old presenting with abdominal distention and absence of bowel movements over the past 5 days. Patient was recently released from the hospital after having a partial small bowel obstruction. Personal history of lymphoma. EXAM: ABDOMEN - 1 VIEW COMPARISON:  08/11/2018, 08/01/2018 and earlier. Chest x-ray 07/30/2018. FINDINGS: AP ERECT image was obtained. Multiple markedly distended loops of small bowel throughout the upper abdomen demonstrating air-fluid levels. Distal small bowel of normal caliber. Normal caliber colon with moderate stool burden in the ascending colon. No evidence of free intraperitoneal air. Linear scarring in the visualized lung bases, unchanged. IMPRESSION: 1. Recurrent partial small bowel obstruction. 2. No free intraperitoneal air. These results will be called to the ordering clinician or representative by the Radiologist Assistant, and communication documented in the PACS or zVision Dashboard. Electronically Signed   By: Evangeline Dakin M.D.   On: 08/17/2018 15:19   Dg Abd 1 View  Result Date: 08/01/2018 CLINICAL DATA:  Small-bowel obstruction. EXAM: ABDOMEN - 1 VIEW COMPARISON:  07/31/2018.  CT 07/30/2018. FINDINGS: Small amount of oral contrast noted in duodenum, proximal small bowel, this is progressed from the stomach from prior day's exam. This is progressed from prior exam of 07/31/2018. Air-fluid levels are noted throughout small and large bowel.  Again these findings are consistent with possible small and large bowel obstruction as noted on recent CT. No free air is identified. No acute bony abnormality. IMPRESSION: 1. Progression of oral contrast into duodenum/proximal small bowel from prior day's exam. 2. Persistent dilated small and large bowel. Again these findings are consistent with possible small and large bowel obstruction is noted on recent CT. No free air. Electronically Signed   By: Marcello Moores  Register   On: 08/01/2018 13:01  Dg Abdomen 1 View  Result Date: 07/30/2018 CLINICAL DATA:  NG tube placement EXAM: ABDOMEN - 1 VIEW COMPARISON:  CT 07/30/2018 FINDINGS: The chest was included due to the NG tube not being present in the abdomen. The NG tube coils in the upper chest near the thoracic inlet. Right Port-A-Cath in place with the tip at the cavoatrial junction. Diffuse gaseous distention of bowel. IMPRESSION: NG tube coils in the upper chest near the thoracic inlet. The tip is not visualized. Electronically Signed   By: Rolm Baptise M.D.   On: 07/30/2018 20:52   Dg Abd 1 View  Result Date: 07/23/2018 CLINICAL DATA:  Follow-up small bowel obstruction. EXAM: ABDOMEN - 1 VIEW COMPARISON:  07/21/2018, radiographs and CT. FINDINGS: There is persistent dilation of the small bowel with minimal colonic air, findings similar to the prior exam, consistent with a high-grade partial small bowel obstruction. IMPRESSION: 1. No significant change. Persistent high-grade small bowel obstruction. Electronically Signed   By: Lajean Manes M.D.   On: 07/23/2018 08:51   Ct Abdomen Pelvis W Contrast  Result Date: 08/17/2018 CLINICAL DATA:  62 year old male with concern for bowel obstruction. History of Burkitt's lymphoma and chemotherapy. EXAM: CT ABDOMEN AND PELVIS WITH CONTRAST TECHNIQUE: Multidetector CT imaging of the abdomen and pelvis was performed using the standard protocol following bolus administration of intravenous contrast. CONTRAST:  136m  ISOVUE-300 IOPAMIDOL (ISOVUE-300) INJECTION 61%, 359mOMNIPAQUE IOHEXOL 300 MG/ML SOLN COMPARISON:  CT of the abdomen pelvis dated 07/30/2018 and abdominal radiograph dated 08/17/2018 FINDINGS: Lower chest: There are bibasilar linear atelectasis/scarring. The tip of a central venous catheter is partially visualized at the cavoatrial junction. No intra-abdominal free air.  Trace fluid adjacent to the liver. Hepatobiliary: Subcentimeter hypodense focus in the dome of the liver is too small to characterize. The liver is otherwise unremarkable. No intrahepatic biliary ductal dilatation. There is a focal area of thickening at the gallbladder fundus most consistent with adenomyomatosis. This was present on the prior studies and was not metabolically active on the PET. No calcified gallstone. Pancreas: Unremarkable. No pancreatic ductal dilatation or surrounding inflammatory changes. Spleen: Normal in size without focal abnormality. Adrenals/Urinary Tract: The adrenal glands are unremarkable. There is a 5 mm nonobstructing calculus in the interpolar aspect of the right kidney. No hydronephrosis. Additional smaller nonobstructing stone noted in the superior pole of the right kidney. Subcentimeter bilateral renal hypodense lesions are not characterized but likely represent cysts. There is no hydronephrosis on either side. There is symmetric enhancement and excretion of contrast by both kidneys. The urinary bladder is grossly unremarkable. Stomach/Bowel: There is segmental thickening of the sigmoid colon involving approximately 8 cm length. There is associated luminal narrowing and stricture of the sigmoid colon with moderate amount of retained stool within the colon proximal to this segment with an appearance of a moderate to high-grade obstruction. The thickened segment of the sigmoid colon may be related to post treatment changes and resulting fibrosis although involvement with a neoplastic process is not excluded. This is  however somewhat new since PET-CT of 06/09/2018. There is stranding of the fat in the pelvis adjacent to the sigmoid colon with tethering of the terminal ileum and appendix consistent with adhesions and scarring. There may be a degree of low-grade or partial small-bowel obstruction at the focus of tethering of the terminal ileum. There is fecalization of the terminal ileum. However, fecal content passes through the colon. The small bowel are dilated measuring up to 6 cm. The appendix is thickened  and dilated with fecal content. It measures up to 2.5 cm in diameter. Multiple appendicular with noted in the distal appendix. The changes of the appendix are likely secondary to post radiation treatment. However, acute appendicitis is not entirely excluded. Clinical correlation is recommended. Vascular/Lymphatic: The abdominal aorta and IVC are grossly unremarkable. No portal venous gas. No adenopathy. Reproductive: The prostate and seminal vesicles are grossly unremarkable. Other: None Musculoskeletal: Degenerative changes of the spine. No acute osseous pathology. IMPRESSION: 1. Segmental thickening of the sigmoid colon with associated luminal narrowing and stricture and resulting obstruction at the level of the sigmoid colon. Findings likely related to post treatment changes although a colonic mass is not entirely excluded. 2. Diffusely dilated small bowel may represent an ileus or a degree of small-bowel obstruction at the level of the terminal ileum secondary to adhesions. Stool however passes into the colon. A small-bowel series may provide better evaluation of the degree of potential obstruction in the terminal ileum. 3. Dilated and thickened appendix, likely related to post treatment changes. Acute appendicitis is not entirely excluded. Clinical correlation is recommended. Electronically Signed   By: Anner Crete M.D.   On: 08/17/2018 21:43   Ct Abdomen Pelvis W Contrast  Result Date: 07/30/2018 CLINICAL  DATA:  Abdominal distention and vomiting. Finished chemotherapy for Burkitt's lymphoma. Recent small bowel obstruction. EXAM: CT ABDOMEN AND PELVIS WITH CONTRAST TECHNIQUE: Multidetector CT imaging of the abdomen and pelvis was performed using the standard protocol following bolus administration of intravenous contrast. CONTRAST:  16m ISOVUE-300 IOPAMIDOL (ISOVUE-300) INJECTION 61% COMPARISON:  PET-CT dated 07/28/2018. Pelvis CT dated 07/21/2018. Abdomen CT dated 07/21/2018. FINDINGS: Lower chest: Bilateral lower lobe atelectasis. Hepatobiliary: Small cyst in the dome of the liver on the right. Additional tiny right lobe liver cyst more inferiorly. Small gallbladder Phrygian cap. Pancreas: Unremarkable. No pancreatic ductal dilatation or surrounding inflammatory changes. Spleen: Normal in size without focal abnormality. Adrenals/Urinary Tract: Normal appearing adrenal glands. Small bilateral renal cysts. 7 mm lower pole right renal calculus. No bladder or ureteral calculi and no hydronephrosis. Stomach/Bowel: Multiple significantly dilated, fluid and gas-filled loops of small bowel to the level of the terminal ileum with mildly dilated colon filled with fluid and gas, to the level of the previously demonstrated 7 cm segment of sigmoid colon with diffuse wall thickening and mild pericolonic soft tissue stranding. A diverticulum is demonstrated at the distal aspect of this abnormality. Also demonstrated is a dilated appendix containing multiple appendicoliths measuring up to 8 mm in maximum diameter each. The appendix measures 14 mm in maximum diameter. There is periappendiceal and pericolonic soft tissue stranding in that region. Vascular/Lymphatic: No significant vascular findings are present. No enlarged abdominal or pelvic lymph nodes. Reproductive: Minimally enlarged prostate gland containing coarse calcifications. Other: Small bilateral inguinal hernias containing fat. Musculoskeletal: Lumbar and lower  thoracic spine degenerative changes. IMPRESSION: 1. Recurrent or persistent distal small bowel obstruction to the level of soft tissue stranding in the central pelvis in an area where the appendix is dilated, containing multiple appendicoliths and where there is a 7 cm long segment of previously demonstrated sigmoid colon thickening and mild pericolonic soft tissue stranding. There is mild colonic dilatation compatible with partial obstruction to the level of the thickened sigmoid colon. There is also mild periappendiceal soft tissue stranding. This could all be due to post treatment changes, appendicitis, sigmoid diverticulitis or a combination of those causes. As previously mentioned, the sigmoid colon wall thickening could also be due to malignancy. 2. 7 mm  nonobstructing lower pole right renal calculus. Electronically Signed   By: Claudie Revering M.D.   On: 07/30/2018 18:47   Nm Pet Image Restag (ps) Skull Base To Thigh  Addendum Date: 07/28/2018   ADDENDUM REPORT: 07/28/2018 14:11 ADDENDUM: Findings and recommendations conveyed Wilhelmina Mcardle' s nurse [facility] 07/28/2018 at14:11. Electronically Signed   By: Suzy Bouchard M.D.   On: 07/28/2018 14:11   Result Date: 07/28/2018 CLINICAL DATA:  Subsequent treatment strategy for Burkitt's lymphoma. Post induction chemotherapy. Chemo immunotherapy. EXAM: NUCLEAR MEDICINE PET SKULL BASE TO THIGH TECHNIQUE: 11.1 mCi F-18 FDG was injected intravenously. Full-ring PET imaging was performed from the skull base to thigh after the radiotracer. CT data was obtained and used for attenuation correction and anatomic localization. Fasting blood glucose: 103 mg/dl COMPARISON:  None. FINDINGS: Mediastinal blood pool activity: SUV max 1.85 NECK: No hypermetabolic lymph nodes in the neck. Incidental CT findings: none CHEST: No hypermetabolic mediastinal or hilar nodes. No suspicious pulmonary nodules on the CT scan. Incidental CT findings: Linear atelectasis at the lung bases.  No suspicious nodules. ABDOMEN/PELVIS: Intense hypermetabolic thickening in the proximal sigmoid colon over approximately 7 cm segment (image 170/4) with SUV max equal 16.5. The thickening is circumferential and obscures the lumen at this level. No there are diverticula through this region on comparison exam. There is inflammatory change within the no pericolonic fat of the upper pelvis. No perforation or abscess present. There is mild inflammation in this region on 2 prior comparison PET-CT scans. In the RIGHT lower quadrant is site of prior tumor involvement of the colon there is moderate hypermetabolic activity associated with the bowel wall without any mass lesion. There is scattered activity throughout the bowel. No hypermetabolic nodes in the abdomen pelvis. Normal metabolic activity the spleen which is normal volume. Normal liver. Incidental CT findings: Mildly dilated fluid-filled loops of small bowel. Favor this ileus related to the inflammatory/process involving the sigmoid colon. No perforation or abscess. No pneumatosis. SKELETON: No focal hypermetabolic activity to suggest skeletal metastasis. Incidental CT findings: none IMPRESSION: 1. Masslike thickening of the proximal sigmoid colon with pericolonic inflammation and intense hypermetabolic activity. Differential include new lymphoma within the proximal sigmoid colon versus diverticulitis versus colitis related to immunotherapy. There are multiple diverticular through this region. Findings are mildly progressive over the last several scans. There is inflammatory stranding through the pericolonic fat which could suggest a inflammatory process such as diverticulitis. The mass-like imaging characteristics and intense metabolic activity is concerning for lymphoma. 2. Dilatation of the small bowel which is fluid-filled likely represents mild ileus related to the sigmoid colon inflammatory/neoplastic process. 3. Moderate activity in the RIGHT colon at site  of prior lymphoma is nonspecific as there scattered activity throughout the bowel. Electronically Signed: By: Suzy Bouchard M.D. On: 07/28/2018 13:49   Dg Abd 2 Views  Result Date: 08/20/2018 CLINICAL DATA:  Pt was admitted for small bowel obstruction 2 days ago related to the lymphoma treated conservatively and had not moved his bowels for the last 7 days. Patient also noticed increasing bowel distention. Pt reported being diagnosed with a bowel obstruction and had a colonoscopy this morning. EXAM: ABDOMEN - 2 VIEW COMPARISON:  08/19/2018 and previous studies including a CT dated 08/17/2018. FINDINGS: There are dilated loops of small bowel with air-fluid levels. Dilation is similar to the previous day's exam. There is some air mixed with stool within a nondistended right colon. No free air. Nasogastric tube tip projects in the proximal stomach, unchanged. IMPRESSION: Persistent  partial bowel obstruction.  No free air. Electronically Signed   By: Lajean Manes M.D.   On: 08/20/2018 17:42   Dg Abd 2 Views  Result Date: 08/11/2018 CLINICAL DATA:  Follow-up bowel obstruction EXAM: ABDOMEN - 2 VIEW COMPARISON:  08/01/2018, 07/31/2018, 07/30/2018 FINDINGS: Mild gas-filled enlarged loops of central small bowel up to 3.9 cm but overall decreased number and degree of small bowel distension. Some scattered colon gas is noted. There is no free air beneath the diaphragm. IMPRESSION: Mild residual small bowel dilatation but overall decreased fluid levels and number of dilated small bowel loops, consistent with partial or resolving bowel obstruction. Findings are improved radiographically as compared with 08/01/2018. Electronically Signed   By: Donavan Foil M.D.   On: 08/11/2018 15:32   Dg Abd Portable 1 View  Result Date: 08/17/2018 CLINICAL DATA:  62 year old male status post NG tube placement. EXAM: PORTABLE ABDOMEN - 1 VIEW COMPARISON:  CT of the abdomen pelvis dated 08/17/2018 FINDINGS: An enteric tube is  partially visualized with side-port in the gastric area and tip in the left upper abdomen likely in the proximal stomach. Dilated loops of small and large bowel noted in the visualized abdomen. IMPRESSION: Enteric tube with tip in the proximal stomach. Electronically Signed   By: Anner Crete M.D.   On: 08/17/2018 22:49   Dg Abd Portable 1v-small Bowel Obstruction Protocol-initial, 8 Hr Delay  Result Date: 07/31/2018 CLINICAL DATA:  Small-bowel obstruction, 8 hour delayed image. EXAM: PORTABLE ABDOMEN - 1 VIEW COMPARISON:  07/30/2018 FINDINGS: Moderate contrast distention of the gastric fundus. There are dilated gas-filled small bowel loops scattered throughout the abdomen and pelvis consistent with small bowel obstruction. There are contrast distended small bowel loops in the left hemiabdomen with what appear to be some enteric contrast within large bowel loops in the right upper quadrant. Findings would suggest a high grade partial SBO accounting for this pattern. No free air is noted. No organomegaly. No acute osseous appearing. IMPRESSION: Contrast noted within the stomach, small bowel loops in the left hemiabdomen and admixed with stool within large bowel loops in the right upper quadrant. Findings would suggest a high-grade partial SBO. Electronically Signed   By: Ashley Royalty M.D.   On: 07/31/2018 22:02    ASSESSMENT & PLAN:   63 y.o. male with  1. 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 No BM or CNS involvement.   04/18/18 PET/CT revealed Continued improvement in right colonic wall thickening and surrounding bulky masses consistent with treated lymphoma. No hypermetabolic activity to suggest residual tumor. There is some hypermetabolic activity associated with the sigmoid colon which demonstrates mild wall thickening, surrounding inflammation and underlying diverticulosis, suggesting mild diverticulitis. Suspected treatment related changes throughout the bone marrow. There is mildly increased activity within the clivus without clear corresponding finding on the CT images. Attention on follow-up recommended. Nonobstructing right renal calculi. 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   06/09/18 PET/CT revealedSoft tissue lesion within the ventral pelvis is again identified demonstrating mild to moderate increased uptake within SUV  max of 5.61. Deauville criteria 4. Concerning for residual metabolically active tumor. 2. Similar appearance of diffusely thickened appendix within the right lower quadrant of the abdomen. Findings may reflect treatment related changes. 3. Increased radiotracer uptake throughout the bone marrow which is favored to represent treatment related changes. 4. Lad coronary artery atherosclerotic calcifications.   2. 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  3.s/pGI bleeding from lymphoma involving bowels-- resolved at this time -- will need to monitor with anticoagulation.  4. DM2 with some increased hyperglycemia with steroids Plan -monitor  -continue Oral hypoglycemics and carb consistent diet at this time.   5. DVT prophylaxis - patient on therapeutic Eliquis, ambulation, SCD  6. Chemotherapy related fatigue - maintain good po hydration and food intake and keeping physically active.    7. Small bowel obstruction/ileus with sigmoid thickening from diverticulitis (vs colitis) vs less likely lymphoma progression. LDH  WNL. PLAN: - -Discussed pt labwork today, 08/11/18;  -patient has completed abx for the possible diverticulitis. -still with some diarrhea. -gradual progression of diet to soft solids from full liquids. -would hold off on lomotil that was prescribed. Might use low dose loperamide if severe diarrhea only. -After current inflammation resolves, will need sigmoidoscopy/colonoscopy to evaluate findings in the sigmoid colon and cecum.  -Abdominal XR today -GI referral to Dr Ardis Hughs in 1 week  -nutritional consultation urgently -CT abd/pelvis in 2 weeks -RTC with Dr [name] in 18 days with labs  All of the patients questions were answered with apparent satisfaction. The patient knows to call the clinic with any problems, questions or concerns.  . The total time spent in the appointment was 25 minutes and more than 50% was on counseling and direct patient cares.    Sullivan Lone MD [place] AAHIVMS [facility] Hematology/Oncology Physician [facility]  (Office):       [phone] (Work cell):  [phone] (Fax):           [phone]

## 2018-08-20 NOTE — Progress Notes (Signed)
Day of Surgery   Subjective/Chief Complaint: No complaints. Had colonoscopy this am   Objective: Vital signs in last 24 hours: Temp:  [97.5 F (36.4 C)-98.2 F (36.8 C)] 97.7 F (36.5 C) (12/08 0918) Pulse Rate:  [72-113] 110 (12/08 0930) Resp:  [11-21] 17 (12/08 0930) BP: (97-137)/(47-90) 119/68 (12/08 0930) SpO2:  [94 %-99 %] 98 % (12/08 0930) Last BM Date: 08/13/18  Intake/Output from previous day: 12/07 0701 - 12/08 0700 In: 3060.2 [I.V.:2813; IV Piggyback:247.2] Out: 1200 [Urine:1200] Intake/Output this shift: No intake/output data recorded.  General appearance: alert and cooperative Resp: clear to auscultation bilaterally Cardio: regular rate and rhythm GI: soft, nontender  Lab Results:  Recent Labs    08/19/18 0607 08/20/18 0417  WBC 5.8 5.0  HGB 9.3* 8.5*  HCT 32.0* 28.9*  PLT 213 224   BMET Recent Labs    08/19/18 0607 08/20/18 0417  NA 140 138  K 3.8 3.4*  CL 102 103  CO2 26 27  GLUCOSE 89 113*  BUN 12 11  CREATININE 0.69 0.58*  CALCIUM 8.5* 8.0*   PT/INR No results for input(s): LABPROT, INR in the last 72 hours. ABG No results for input(s): PHART, HCO3 in the last 72 hours.  Invalid input(s): PCO2, PO2  Studies/Results: Dg Abd 1 View  Result Date: 08/19/2018 CLINICAL DATA:  62 year old male with bowel obstruction. EXAM: ABDOMEN - 1 VIEW COMPARISON:  Abdominal radiograph dated 08/17/2018 and CT dated 08/17/2018 FINDINGS: There is persistent diffuse dilatation of air-filled loops of small bowel measuring up to 6 cm in the left lower quadrant. No free air. The osseous structures and soft tissues are grossly unremarkable. IMPRESSION: Persistent small bowel dilatation.  Continued follow-up recommended. Electronically Signed   By: Anner Crete M.D.   On: 08/19/2018 05:40    Anti-infectives: Anti-infectives (From admission, onward)   Start     Dose/Rate Route Frequency Ordered Stop   08/18/18 1000  piperacillin-tazobactam (ZOSYN) IVPB  3.375 g     3.375 g 12.5 mL/hr over 240 Minutes Intravenous Every 8 hours 08/18/18 0858     08/17/18 2300  piperacillin-tazobactam (ZOSYN) IVPB 3.375 g     3.375 g 100 mL/hr over 30 Minutes Intravenous  Once 08/17/18 2254 08/18/18 0203      Assessment/Plan: s/p Procedure(s): COLONOSCOPY (N/A) Colonoscopy showed extrinsic narrowing of sigmoid colon. Doubt this will get better without surgery. Will get prealbumin today. If low then he may benefit from several days of tpn before surgery. Will discuss with primary team tomorrow  LOS: 3 days    Autumn Messing III 08/20/2018

## 2018-08-21 ENCOUNTER — Inpatient Hospital Stay: Payer: Self-pay

## 2018-08-21 ENCOUNTER — Encounter (HOSPITAL_COMMUNITY): Payer: Self-pay | Admitting: Internal Medicine

## 2018-08-21 LAB — GLUCOSE, CAPILLARY
GLUCOSE-CAPILLARY: 138 mg/dL — AB (ref 70–99)
Glucose-Capillary: 85 mg/dL (ref 70–99)
Glucose-Capillary: 115 mg/dL — ABNORMAL HIGH (ref 70–99)
Glucose-Capillary: 101 mg/dL — ABNORMAL HIGH (ref 70–99)
Glucose-Capillary: 103 mg/dL — ABNORMAL HIGH (ref 70–99)
Glucose-Capillary: 117 mg/dL — ABNORMAL HIGH (ref 70–99)

## 2018-08-21 LAB — BASIC METABOLIC PANEL
Anion gap: 7 (ref 5–15)
BUN: 10 mg/dL (ref 8–23)
CO2: 27 mmol/L (ref 22–32)
Calcium: 7.9 mg/dL — ABNORMAL LOW (ref 8.9–10.3)
Chloride: 103 mmol/L (ref 98–111)
Creatinine, Ser: 0.48 mg/dL — ABNORMAL LOW (ref 0.61–1.24)
GFR calc Af Amer: >60 mL/min (ref >60)
GLUCOSE: 121 mg/dL — AB (ref 70–99)
Potassium: 3.2 mmol/L — ABNORMAL LOW (ref 3.5–5.1)
Sodium: 137 mmol/L (ref 135–145)

## 2018-08-21 LAB — CBC WITH DIFFERENTIAL/PLATELET
Abs Immature Granulocytes: 0.02 10*3/uL (ref 0.00–0.07)
Basophils Absolute: 0 10*3/uL (ref 0.0–0.1)
Basophils Relative: 0 %
Eosinophils Absolute: 0 10*3/uL (ref 0.0–0.5)
Eosinophils Relative: 0 %
HCT: 28 % — ABNORMAL LOW (ref 39.0–52.0)
Hemoglobin: 8.4 g/dL — ABNORMAL LOW (ref 13.0–17.0)
Immature Granulocytes: 0 %
Lymphocytes Relative: 36 %
Lymphs Abs: 1.8 10*3/uL (ref 0.7–4.0)
MCH: 30.4 pg (ref 26.0–34.0)
MCHC: 30 g/dL (ref 30.0–36.0)
MCV: 101.4 fL — AB (ref 80.0–100.0)
Monocytes Absolute: 0.5 10*3/uL (ref 0.1–1.0)
Monocytes Relative: 10 %
Neutro Abs: 2.7 10*3/uL (ref 1.7–7.7)
Neutrophils Relative %: 54 %
PLATELETS: 176 10*3/uL (ref 150–400)
RBC: 2.76 MIL/uL — ABNORMAL LOW (ref 4.22–5.81)
RDW: 19 % — ABNORMAL HIGH (ref 11.5–15.5)
WBC: 5 10*3/uL (ref 4.0–10.5)
nRBC: 0 % (ref 0.0–0.2)

## 2018-08-21 LAB — APTT: aPTT: 81 seconds — ABNORMAL HIGH (ref 24–36)

## 2018-08-21 LAB — HEPARIN LEVEL (UNFRACTIONATED): Heparin Unfractionated: 0.47 IU/mL (ref 0.30–0.70)

## 2018-08-21 MED ORDER — POTASSIUM CHLORIDE 10 MEQ/100ML IV SOLN
10.0000 meq | INTRAVENOUS | Status: AC
  Administered 2018-08-21 (×4): 10 meq via INTRAVENOUS
  Filled 2018-08-21: qty 100

## 2018-08-21 MED ORDER — TRAVASOL 10 % IV SOLN
INTRAVENOUS | Status: AC
  Administered 2018-08-21: 19:00:00 via INTRAVENOUS
  Filled 2018-08-21: qty 576

## 2018-08-21 MED ORDER — DEXTROSE-NACL 5-0.45 % IV SOLN
INTRAVENOUS | Status: AC
  Administered 2018-08-22: 02:00:00 via INTRAVENOUS

## 2018-08-21 NOTE — Progress Notes (Signed)
[name]   HEMATOLOGY/ONCOLOGY INPATIENT PROGRESS NOTE  Date of Service: 08/21/2018  Inpatient Attending: .Alma Friendly, MD   SUBJECTIVE  Mark Frey is accompanied today by his wife and sister in law. The pt reports that he is okay. Still needing th NGT to suction with feculent drainage. Patient had colonoscopy over the weekend by GI - showed sigmoid colon narrowing from extrinsic compression. No mucosal pathology/lesions noted.  Lab results today (08/21/18) of CBC w/diff and BMP is as follows: all values are WNL except for RBC at 2.76, HGB at 8.4, HCT at 28.0, MCV at 101.4, RDW at 19.0, Potassium at 3.2, Glucose at 121, Creatinine at 0.48, Calcium at 7.9.   Patient was seen alongside surgery Dr Greer Pickerel - he was counseled regarding present surgical consideration of diverting colostomy vs nutritional support with TPN and delayed colonic surgery with possible primary re-anastomosis(but might need colostomy if anatomy difficulty).  Concern from patient and family addressed.  OBJECTIVE:  NAD  PHYSICAL EXAMINATION: . Vitals:   08/20/18 1217 08/20/18 1618 08/20/18 2029 08/21/18 0427  BP: 123/80 123/77 124/77 123/77  Pulse: (!) 105 (!) 103 (!) 103 95  Resp: 18 16 20 20   Temp: 97.9 F (36.6 C) 97.9 F (36.6 C) 97.7 F (36.5 C) (!) 97.4 F (36.3 C)  TempSrc: Oral Oral Oral Oral  SpO2: 98% 98% 95% 97%  Weight:      Height:       Filed Weights   08/17/18 1555 08/19/18 0700  Weight: 199 lb (90.3 kg) 198 lb 10.2 oz (90.1 kg)   .Body mass index is 29.33 kg/m.   GENERAL:alert, NGT in situ SKIN: no acute rashes, no significant lesions EYES: conjunctiva are pink and non-injected, sclera anicteric OROPHARYNX: MMM, NGT in situ NECK: supple, no JVD LYMPH:  no palpable lymphadenopathy in the cervical, axillary or inguinal regions LUNGS: clear to auscultation b/l with normal respiratory effort HEART: regular rate & rhythm ABDOMEN: Mild abdominal distension with some TTP in  the lower abdomen. No guarding/rigidity/rebound Extremity: no pedal edema PSYCH: alert & oriented x 3 with fluent speech NEURO: no focal motor/sensory deficits   MEDICAL HISTORY:  Past Medical History:  Diagnosis Date  . ALLERGIC RHINITIS   . Cancer ([place])    Lymphoma   . Diabetes mellitus   . Hyperlipidemia     SURGICAL HISTORY: Past Surgical History:  Procedure Laterality Date  . BIOPSY  03/15/2018   Procedure: BIOPSY;  Surgeon: Milus Banister, MD;  Location: [facility];  Service: Endoscopy;;  . CLEFT PALATE REPAIR    . COLONOSCOPY N/A 03/15/2018   Procedure: COLONOSCOPY;  Surgeon: Milus Banister, MD;  Location: [facility];  Service: Endoscopy;  Laterality: N/A;  . COLONOSCOPY N/A 08/20/2018   Procedure: COLONOSCOPY;  Surgeon: Irene Shipper, MD;  Location: [facility];  Service: Endoscopy;  Laterality: N/A;  . deviated septum repair     slight improvement  . ESOPHAGOGASTRODUODENOSCOPY N/A 03/15/2018   Procedure: ESOPHAGOGASTRODUODENOSCOPY (EGD);  Surgeon: Milus Banister, MD;  Location: [name];  Service: Endoscopy;  Laterality: N/A;  . IR IMAGING GUIDED PORT INSERTION  03/17/2018  . TONSILLECTOMY      SOCIAL HISTORY: Social History   Socioeconomic History  . Marital status: Married    Spouse name: lisa  . Number of children: 0  . Years of education: Not on file  . Highest education level: Not on file  Occupational History  . Not on file  Social Needs  . Financial  resource strain: Not hard at all  . Food insecurity:    Worry: Never true    Inability: Never true  . Transportation needs:    Medical: No    Non-medical: No  Tobacco Use  . Smoking status: Never Smoker  . Smokeless tobacco: Never Used  Substance and Sexual Activity  . Alcohol use: Yes    Comment: occasional  . Drug use: No  . Sexual activity: Yes  Lifestyle  . Physical activity:    Days per week: 0 days    Minutes per session: 0 min  . Stress: Not at all  Relationships  . Social  connections:    Talks on phone: More than three times a week    Gets together: More than three times a week    Attends religious service: 1 to 4 times per year    Active member of club or organization: No    Attends meetings of clubs or organizations: Never    Relationship status: Married  . Intimate partner violence:    Fear of current or ex partner: No    Emotionally abused: No    Physically abused: No    Forced sexual activity: No  Other Topics Concern  . Not on file  Social History Narrative   Married 1985. No kids. 4 small dogs.       Works in Financial trader, residential      Hobbies: work on cars, Haematologist, exercise as able    FAMILY HISTORY: Family History  Problem Relation Age of Onset  . Lung cancer Mother        smoker  . Brain cancer Mother        metastasis  . AAA (abdominal aortic aneurysm) Father        smoker    ALLERGIES:  is allergic to ciprofloxacin.  MEDICATIONS:  Scheduled Meds: . insulin aspart  0-9 Units Subcutaneous Q4H   Continuous Infusions: . dextrose 5 % and 0.45% NaCl 100 mL/hr at 08/21/18 0954  . dextrose 5 % and 0.45% NaCl    . heparin 1,850 Units/hr (08/21/18 0558)  . piperacillin-tazobactam (ZOSYN)  IV 3.375 g (08/21/18 0914)  . potassium chloride 10 mEq (08/21/18 1114)  . TPN ADULT (ION)     PRN Meds:.acetaminophen **OR** acetaminophen, morphine injection, ondansetron **OR** ondansetron (ZOFRAN) IV, phenol, sodium chloride flush  REVIEW OF SYSTEMS:    A 10+ POINT REVIEW OF SYSTEMS WAS OBTAINED including neurology, dermatology, psychiatry, cardiac, respiratory, lymph, extremities, GI, GU, Musculoskeletal, constitutional, breasts, reproductive, HEENT.  All pertinent positives are noted in the HPI.  All others are negative.    LABORATORY DATA:  I have reviewed the data as listed  . CBC Latest Ref Rng & Units 08/21/2018 08/20/2018 08/19/2018  WBC 4.0 - 10.5 K/uL 5.0 5.0 5.8  Hemoglobin 13.0 - 17.0 g/dL 8.4(L) 8.5(L) 9.3(L)    Hematocrit 39.0 - 52.0 % 28.0(L) 28.9(L) 32.0(L)  Platelets 150 - 400 K/uL 176 224 213    . CMP Latest Ref Rng & Units 08/21/2018 08/20/2018 08/19/2018  Glucose 70 - 99 mg/dL 121(H) 113(H) 89  BUN 8 - 23 mg/dL 10 11 12   Creatinine 0.61 - 1.24 mg/dL 0.48(L) 0.58(L) 0.69  Sodium 135 - 145 mmol/L 137 138 140  Potassium 3.5 - 5.1 mmol/L 3.2(L) 3.4(L) 3.8  Chloride 98 - 111 mmol/L 103 103 102  CO2 22 - 32 mmol/L 27 27 26   Calcium 8.9 - 10.3 mg/dL 7.9(L) 8.0(L) 8.5(L)  Total Protein 6.5 - 8.1  g/dL - - -  Total Bilirubin 0.3 - 1.2 mg/dL - - -  Alkaline Phos 38 - 126 U/L - - -  AST 15 - 41 U/L - - -  ALT 0 - 44 U/L - - -     RADIOGRAPHIC STUDIES: I have personally reviewed the radiological images as listed and agreed with the findings in the report. Dg Abd 1 View  Result Date: 08/19/2018 CLINICAL DATA:  62 year old male with bowel obstruction. EXAM: ABDOMEN - 1 VIEW COMPARISON:  Abdominal radiograph dated 08/17/2018 and CT dated 08/17/2018 FINDINGS: There is persistent diffuse dilatation of air-filled loops of small bowel measuring up to 6 cm in the left lower quadrant. No free air. The osseous structures and soft tissues are grossly unremarkable. IMPRESSION: Persistent small bowel dilatation.  Continued follow-up recommended. Electronically Signed   By: Anner Crete M.D.   On: 08/19/2018 05:40   Dg Abd 1 View  Result Date: 08/17/2018 CLINICAL DATA:  62 year old presenting with abdominal distention and absence of bowel movements over the past 5 days. Patient was recently released from the hospital after having a partial small bowel obstruction. Personal history of lymphoma. EXAM: ABDOMEN - 1 VIEW COMPARISON:  08/11/2018, 08/01/2018 and earlier. Chest x-ray 07/30/2018. FINDINGS: AP ERECT image was obtained. Multiple markedly distended loops of small bowel throughout the upper abdomen demonstrating air-fluid levels. Distal small bowel of normal caliber. Normal caliber colon with moderate stool  burden in the ascending colon. No evidence of free intraperitoneal air. Linear scarring in the visualized lung bases, unchanged. IMPRESSION: 1. Recurrent partial small bowel obstruction. 2. No free intraperitoneal air. These results will be called to the ordering clinician or representative by the Radiologist Assistant, and communication documented in the PACS or zVision Dashboard. Electronically Signed   By: Evangeline Dakin M.D.   On: 08/17/2018 15:19   Dg Abd 1 View  Result Date: 08/01/2018 CLINICAL DATA:  Small-bowel obstruction. EXAM: ABDOMEN - 1 VIEW COMPARISON:  07/31/2018.  CT 07/30/2018. FINDINGS: Small amount of oral contrast noted in duodenum, proximal small bowel, this is progressed from the stomach from prior day's exam. This is progressed from prior exam of 07/31/2018. Air-fluid levels are noted throughout small and large bowel. Again these findings are consistent with possible small and large bowel obstruction as noted on recent CT. No free air is identified. No acute bony abnormality. IMPRESSION: 1. Progression of oral contrast into duodenum/proximal small bowel from prior day's exam. 2. Persistent dilated small and large bowel. Again these findings are consistent with possible small and large bowel obstruction is noted on recent CT. No free air. Electronically Signed   By: Marcello Moores  Register   On: 08/01/2018 13:01   Dg Abdomen 1 View  Result Date: 07/30/2018 CLINICAL DATA:  NG tube placement EXAM: ABDOMEN - 1 VIEW COMPARISON:  CT 07/30/2018 FINDINGS: The chest was included due to the NG tube not being present in the abdomen. The NG tube coils in the upper chest near the thoracic inlet. Right Port-A-Cath in place with the tip at the cavoatrial junction. Diffuse gaseous distention of bowel. IMPRESSION: NG tube coils in the upper chest near the thoracic inlet. The tip is not visualized. Electronically Signed   By: Rolm Baptise M.D.   On: 07/30/2018 20:52   Dg Abd 1 View  Result Date:  07/23/2018 CLINICAL DATA:  Follow-up small bowel obstruction. EXAM: ABDOMEN - 1 VIEW COMPARISON:  07/21/2018, radiographs and CT. FINDINGS: There is persistent dilation of the small bowel with minimal  colonic air, findings similar to the prior exam, consistent with a high-grade partial small bowel obstruction. IMPRESSION: 1. No significant change. Persistent high-grade small bowel obstruction. Electronically Signed   By: Lajean Manes M.D.   On: 07/23/2018 08:51   Ct Abdomen Pelvis W Contrast  Result Date: 08/17/2018 CLINICAL DATA:  62 year old male with concern for bowel obstruction. History of Burkitt's lymphoma and chemotherapy. EXAM: CT ABDOMEN AND PELVIS WITH CONTRAST TECHNIQUE: Multidetector CT imaging of the abdomen and pelvis was performed using the standard protocol following bolus administration of intravenous contrast. CONTRAST:  113m ISOVUE-300 IOPAMIDOL (ISOVUE-300) INJECTION 61%, 334mOMNIPAQUE IOHEXOL 300 MG/ML SOLN COMPARISON:  CT of the abdomen pelvis dated 07/30/2018 and abdominal radiograph dated 08/17/2018 FINDINGS: Lower chest: There are bibasilar linear atelectasis/scarring. The tip of a central venous catheter is partially visualized at the cavoatrial junction. No intra-abdominal free air.  Trace fluid adjacent to the liver. Hepatobiliary: Subcentimeter hypodense focus in the dome of the liver is too small to characterize. The liver is otherwise unremarkable. No intrahepatic biliary ductal dilatation. There is a focal area of thickening at the gallbladder fundus most consistent with adenomyomatosis. This was present on the prior studies and was not metabolically active on the PET. No calcified gallstone. Pancreas: Unremarkable. No pancreatic ductal dilatation or surrounding inflammatory changes. Spleen: Normal in size without focal abnormality. Adrenals/Urinary Tract: The adrenal glands are unremarkable. There is a 5 mm nonobstructing calculus in the interpolar aspect of the right  kidney. No hydronephrosis. Additional smaller nonobstructing stone noted in the superior pole of the right kidney. Subcentimeter bilateral renal hypodense lesions are not characterized but likely represent cysts. There is no hydronephrosis on either side. There is symmetric enhancement and excretion of contrast by both kidneys. The urinary bladder is grossly unremarkable. Stomach/Bowel: There is segmental thickening of the sigmoid colon involving approximately 8 cm length. There is associated luminal narrowing and stricture of the sigmoid colon with moderate amount of retained stool within the colon proximal to this segment with an appearance of a moderate to high-grade obstruction. The thickened segment of the sigmoid colon may be related to post treatment changes and resulting fibrosis although involvement with a neoplastic process is not excluded. This is however somewhat new since PET-CT of 06/09/2018. There is stranding of the fat in the pelvis adjacent to the sigmoid colon with tethering of the terminal ileum and appendix consistent with adhesions and scarring. There may be a degree of low-grade or partial small-bowel obstruction at the focus of tethering of the terminal ileum. There is fecalization of the terminal ileum. However, fecal content passes through the colon. The small bowel are dilated measuring up to 6 cm. The appendix is thickened and dilated with fecal content. It measures up to 2.5 cm in diameter. Multiple appendicular with noted in the distal appendix. The changes of the appendix are likely secondary to post radiation treatment. However, acute appendicitis is not entirely excluded. Clinical correlation is recommended. Vascular/Lymphatic: The abdominal aorta and IVC are grossly unremarkable. No portal venous gas. No adenopathy. Reproductive: The prostate and seminal vesicles are grossly unremarkable. Other: None Musculoskeletal: Degenerative changes of the spine. No acute osseous pathology.  IMPRESSION: 1. Segmental thickening of the sigmoid colon with associated luminal narrowing and stricture and resulting obstruction at the level of the sigmoid colon. Findings likely related to post treatment changes although a colonic mass is not entirely excluded. 2. Diffusely dilated small bowel may represent an ileus or a degree of small-bowel obstruction at the level  of the terminal ileum secondary to adhesions. Stool however passes into the colon. A small-bowel series may provide better evaluation of the degree of potential obstruction in the terminal ileum. 3. Dilated and thickened appendix, likely related to post treatment changes. Acute appendicitis is not entirely excluded. Clinical correlation is recommended. Electronically Signed   By: Anner Crete M.D.   On: 08/17/2018 21:43   Ct Abdomen Pelvis W Contrast  Result Date: 07/30/2018 CLINICAL DATA:  Abdominal distention and vomiting. Finished chemotherapy for Burkitt's lymphoma. Recent small bowel obstruction. EXAM: CT ABDOMEN AND PELVIS WITH CONTRAST TECHNIQUE: Multidetector CT imaging of the abdomen and pelvis was performed using the standard protocol following bolus administration of intravenous contrast. CONTRAST:  184m ISOVUE-300 IOPAMIDOL (ISOVUE-300) INJECTION 61% COMPARISON:  PET-CT dated 07/28/2018. Pelvis CT dated 07/21/2018. Abdomen CT dated 07/21/2018. FINDINGS: Lower chest: Bilateral lower lobe atelectasis. Hepatobiliary: Small cyst in the dome of the liver on the right. Additional tiny right lobe liver cyst more inferiorly. Small gallbladder Phrygian cap. Pancreas: Unremarkable. No pancreatic ductal dilatation or surrounding inflammatory changes. Spleen: Normal in size without focal abnormality. Adrenals/Urinary Tract: Normal appearing adrenal glands. Small bilateral renal cysts. 7 mm lower pole right renal calculus. No bladder or ureteral calculi and no hydronephrosis. Stomach/Bowel: Multiple significantly dilated, fluid and  gas-filled loops of small bowel to the level of the terminal ileum with mildly dilated colon filled with fluid and gas, to the level of the previously demonstrated 7 cm segment of sigmoid colon with diffuse wall thickening and mild pericolonic soft tissue stranding. A diverticulum is demonstrated at the distal aspect of this abnormality. Also demonstrated is a dilated appendix containing multiple appendicoliths measuring up to 8 mm in maximum diameter each. The appendix measures 14 mm in maximum diameter. There is periappendiceal and pericolonic soft tissue stranding in that region. Vascular/Lymphatic: No significant vascular findings are present. No enlarged abdominal or pelvic lymph nodes. Reproductive: Minimally enlarged prostate gland containing coarse calcifications. Other: Small bilateral inguinal hernias containing fat. Musculoskeletal: Lumbar and lower thoracic spine degenerative changes. IMPRESSION: 1. Recurrent or persistent distal small bowel obstruction to the level of soft tissue stranding in the central pelvis in an area where the appendix is dilated, containing multiple appendicoliths and where there is a 7 cm long segment of previously demonstrated sigmoid colon thickening and mild pericolonic soft tissue stranding. There is mild colonic dilatation compatible with partial obstruction to the level of the thickened sigmoid colon. There is also mild periappendiceal soft tissue stranding. This could all be due to post treatment changes, appendicitis, sigmoid diverticulitis or a combination of those causes. As previously mentioned, the sigmoid colon wall thickening could also be due to malignancy. 2. 7 mm nonobstructing lower pole right renal calculus. Electronically Signed   By: SClaudie ReveringM.D.   On: 07/30/2018 18:47   Nm Pet Image Restag (ps) Skull Base To Thigh  Addendum Date: 07/28/2018   ADDENDUM REPORT: 07/28/2018 14:11 ADDENDUM: Findings and recommendations conveyed tWilhelmina Mcardle s nurse  [facility] at14:11. Electronically Signed   By: SSuzy BouchardM.D.   On: 07/28/2018 14:11   Result Date: 07/28/2018 CLINICAL DATA:  Subsequent treatment strategy for Burkitt's lymphoma. Post induction chemotherapy. Chemo immunotherapy. EXAM: NUCLEAR MEDICINE PET SKULL BASE TO THIGH TECHNIQUE: 11.1 mCi F-18 FDG was injected intravenously. Full-ring PET imaging was performed from the skull base to thigh after the radiotracer. CT data was obtained and used for attenuation correction and anatomic localization. Fasting blood glucose: 103 mg/dl COMPARISON:  None. FINDINGS:  Mediastinal blood pool activity: SUV max 1.85 NECK: No hypermetabolic lymph nodes in the neck. Incidental CT findings: none CHEST: No hypermetabolic mediastinal or hilar nodes. No suspicious pulmonary nodules on the CT scan. Incidental CT findings: Linear atelectasis at the lung bases. No suspicious nodules. ABDOMEN/PELVIS: Intense hypermetabolic thickening in the proximal sigmoid colon over approximately 7 cm segment (image 170/4) with SUV max equal 16.5. The thickening is circumferential and obscures the lumen at this level. No there are diverticula through this region on comparison exam. There is inflammatory change within the no pericolonic fat of the upper pelvis. No perforation or abscess present. There is mild inflammation in this region on 2 prior comparison PET-CT scans. In the RIGHT lower quadrant is site of prior tumor involvement of the colon there is moderate hypermetabolic activity associated with the bowel wall without any mass lesion. There is scattered activity throughout the bowel. No hypermetabolic nodes in the abdomen pelvis. Normal metabolic activity the spleen which is normal volume. Normal liver. Incidental CT findings: Mildly dilated fluid-filled loops of small bowel. Favor this ileus related to the inflammatory/process involving the sigmoid colon. No perforation or abscess. No pneumatosis. SKELETON: No focal  hypermetabolic activity to suggest skeletal metastasis. Incidental CT findings: none IMPRESSION: 1. Masslike thickening of the proximal sigmoid colon with pericolonic inflammation and intense hypermetabolic activity. Differential include new lymphoma within the proximal sigmoid colon versus diverticulitis versus colitis related to immunotherapy. There are multiple diverticular through this region. Findings are mildly progressive over the last several scans. There is inflammatory stranding through the pericolonic fat which could suggest a inflammatory process such as diverticulitis. The mass-like imaging characteristics and intense metabolic activity is concerning for lymphoma. 2. Dilatation of the small bowel which is fluid-filled likely represents mild ileus related to the sigmoid colon inflammatory/neoplastic process. 3. Moderate activity in the RIGHT colon at site of prior lymphoma is nonspecific as there scattered activity throughout the bowel. Electronically Signed: By: Suzy Bouchard M.D. On: 07/28/2018 13:49   Dg Abd 2 Views  Result Date: 08/20/2018 CLINICAL DATA:  Pt was admitted for small bowel obstruction 2 days ago related to the lymphoma treated conservatively and had not moved his bowels for the last 7 days. Patient also noticed increasing bowel distention. Pt reported being diagnosed with a bowel obstruction and had a colonoscopy this morning. EXAM: ABDOMEN - 2 VIEW COMPARISON:  08/19/2018 and previous studies including a CT dated 08/17/2018. FINDINGS: There are dilated loops of small bowel with air-fluid levels. Dilation is similar to the previous day's exam. There is some air mixed with stool within a nondistended right colon. No free air. Nasogastric tube tip projects in the proximal stomach, unchanged. IMPRESSION: Persistent partial bowel obstruction.  No free air. Electronically Signed   By: Lajean Manes M.D.   On: 08/20/2018 17:42   Dg Abd 2 Views  Result Date: 08/11/2018 CLINICAL  DATA:  Follow-up bowel obstruction EXAM: ABDOMEN - 2 VIEW COMPARISON:  08/01/2018, 07/31/2018, 07/30/2018 FINDINGS: Mild gas-filled enlarged loops of central small bowel up to 3.9 cm but overall decreased number and degree of small bowel distension. Some scattered colon gas is noted. There is no free air beneath the diaphragm. IMPRESSION: Mild residual small bowel dilatation but overall decreased fluid levels and number of dilated small bowel loops, consistent with partial or resolving bowel obstruction. Findings are improved radiographically as compared with 08/01/2018. Electronically Signed   By: Donavan Foil M.D.   On: 08/11/2018 15:32   Dg Abd Portable 1  View  Result Date: 08/17/2018 CLINICAL DATA:  62 year old male status post NG tube placement. EXAM: PORTABLE ABDOMEN - 1 VIEW COMPARISON:  CT of the abdomen pelvis dated 08/17/2018 FINDINGS: An enteric tube is partially visualized with side-port in the gastric area and tip in the left upper abdomen likely in the proximal stomach. Dilated loops of small and large bowel noted in the visualized abdomen. IMPRESSION: Enteric tube with tip in the proximal stomach. Electronically Signed   By: Anner Crete M.D.   On: 08/17/2018 22:49   Dg Abd Portable 1v-small Bowel Obstruction Protocol-initial, 8 Hr Delay  Result Date: 07/31/2018 CLINICAL DATA:  Small-bowel obstruction, 8 hour delayed image. EXAM: PORTABLE ABDOMEN - 1 VIEW COMPARISON:  07/30/2018 FINDINGS: Moderate contrast distention of the gastric fundus. There are dilated gas-filled small bowel loops scattered throughout the abdomen and pelvis consistent with small bowel obstruction. There are contrast distended small bowel loops in the left hemiabdomen with what appear to be some enteric contrast within large bowel loops in the right upper quadrant. Findings would suggest a high grade partial SBO accounting for this pattern. No free air is noted. No organomegaly. No acute osseous appearing.  IMPRESSION: Contrast noted within the stomach, small bowel loops in the left hemiabdomen and admixed with stool within large bowel loops in the right upper quadrant. Findings would suggest a high-grade partial SBO. Electronically Signed   By: Ashley Royalty M.D.   On: 07/31/2018 22:02   [place] Ekg Site Rite  Result Date: 08/21/2018 If Site Rite image not attached, placement could not be confirmed due to current cardiac rhythm.   ASSESSMENT & PLAN:   62 y.o. male with  1. 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 No BM or CNS involvement.   04/18/18 PET/CT revealed Continued improvement in right colonic wall thickening and surrounding bulky masses consistent with treated lymphoma. No hypermetabolic activity to suggest residual tumor. There is some hypermetabolic activity associated with the sigmoid colon which demonstrates mild wall thickening, surrounding inflammation and underlying diverticulosis, suggesting mild diverticulitis. Suspected treatment related changes throughout the bone marrow. There is mildly increased activity within the clivus without clear corresponding finding on the CT images. Attention on follow-up recommended. Nonobstructing right renal calculi. 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   06/09/18 PET/CT revealedSoft tissue lesion within the ventral pelvis is again identified demonstrating mild to moderate increased uptake within SUV max of 5.61. [place]  criteria 4. Concerning for residual metabolically active tumor. 2. Similar appearance of diffusely thickened appendix within the right lower quadrant of the abdomen. Findings may reflect treatment related changes. 3. Increased radiotracer uptake throughout the bone marrow which is favored to represent treatment related changes. 4. Lad coronary artery atherosclerotic calcifications.   2. 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  3.h/oGI bleeding from lymphoma involving bowels on presentation-- resolved at this time -- will need to monitor with anticoagulation.  4. DM2 with some increased hyperglycemia with steroids Plan -monitor  -continue Oral hypoglycemics and carb consistent diet at this time.   5. Small  bowel obstruction/ileus with sigmoid thickening/stenosis .  CT scan - sigmoid obstructive lesion ? Scar tissue from resolved lymphoma vs residual lymphoma vs post inflammatory scarring from diverticulitis/limited perforation. Also has SBO from adhesions. Colonoscopy 08/21/2018 - no intramucosal lesions. Extrinsic compression of sigmoid colon. PLAN: -bowel rest -IVF -replace lytes to K>4 and Magnesium >2 -GI and surgery input appreciated -colonoscopy results discussed -conference had with surgery Dr Greer Pickerel at bedside with the patient and his wife and sister in law -- surgery was recommended to address obstructive symptoms - -counseled regarding present surgical consideration of diverting colostomy vs nutritional support with TPN and delayed colonic surgery with possible primary re-anastomosis(but might need colostomy if anatomy difficulty). -risks and benefits of TPN discussed. 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 Holding plan for consolidative RT -will need peri-operative anticoag mx - on IV heparin which will allow for limitation of time off anticoagulation -patient had complete planned chemorx on 10/21 - and has had enough time of chemotherapy  that I do not anticipate this should be a limiting factor for surgical consideration on it own.  The total time spent in the appt was 35 minutes and more than 50% was on counseling and direct patient cares.    Sullivan Lone MD MS AAHIVMS [facility] Hematology/Oncology Physician [facility]  (Office):       [phone] (Work cell):  [phone] (Fax):           [phone]  08/21/2018 12:23 PM  I, Baldwin Jamaica, am acting as a scribe for Dr. Sullivan Lone.   .I have reviewed the above documentation for accuracy and completeness, and I agree with the above. Sullivan Lone MD MS

## 2018-08-21 NOTE — Progress Notes (Signed)
PHARMACY - ADULT TOTAL PARENTERAL NUTRITION CONSULT NOTE   Pharmacy Consult for TPN  Indication: bowel obstruction  Patient Measurements: Height: 5' 9"  (175.3 cm) Weight: 198 lb 10.2 oz (90.1 kg) IBW/kg (Calculated) : 70.7 TPN AdjBW (KG): 75.6 Body mass index is 29.33 kg/m. Usual Weight:     Insulin Requirements: on sensitive SSI q4h, none used since 12/6  Current Nutrition: NPO   IVF: D5-NS at 100 ml/hr   Central access:  Port placed 03/27/18  TPN start date: 08/21/2018   ASSESSMENT                                                                                                          HPI: Pharmacy is consulted to start TPN on 62 yo male with a diagnosis of SBO. Pt will need a surgical intervention and needs to improve nutritional status prior to any procedure as prealbumin is 6.   Significant events:   12/9 Start TPN  Today:   Glucose ( goal <150)  - WNL   Electrolytes - K+ 3.2, Calcium is 7.9 but Corrected Calcium is 8.9, low end of normal, other recent labs WNL   Renal - SCr stable, WNL  LFTs - WNL ( 12/6)   TGs - pending   Prealbumin - 6.2 ( 12/8)  NUTRITIONAL GOALS                                                                                             RD recs: Pending    Potassium chloride 10 meq IV x4 ( MD)   PLAN                                                                                                                         At 1800 today:  Start TPN at 40 ml/hr.  Initial TPN contains 57.6 grams of protein, 144 grams of dextrose and 28.8 grams of lipid, providing 1008 Kcal.   Plan to advance as tolerated to the goal rate, not yet established  TPN to contain standard multivitamins and trace elements.  Reduce IVF to 60 ml/hr at 1800  Continue  SSI q4h.   TPN lab panels  on Mondays & Thursdays.  F/u daily.   Royetta Asal, PharmD, BCPS Pager [phone] 08/21/2018 11:41 AM

## 2018-08-21 NOTE — Progress Notes (Signed)
[place] for heparin Indication: hx pulmonary embolus and DVT (home Eliquis on hold)  Allergies  Allergen Reactions  . Ciprofloxacin Other (See Comments)    Leg tingling    Patient Measurements: Height: 5' 9"  (175.3 cm) Weight: 198 lb 10.2 oz (90.1 kg) IBW/kg (Calculated) : 70.7 Heparin Dosing Weight: 89 kg  Vital Signs: Temp: 97.4 F (36.3 C) (12/09 0427) Temp Source: Oral (12/09 0427) BP: 123/77 (12/09 0427) Pulse Rate: 95 (12/09 0427)  Labs: Recent Labs    08/19/18 0607  08/19/18 2257 08/20/18 0417 08/20/18 1021 08/20/18 1121 08/21/18 0418  HGB 9.3*  --   --  8.5*  --   --  8.4*  HCT 32.0*  --   --  28.9*  --   --  28.0*  PLT 213  --   --  224  --   --  176  APTT 42*   < > 189*  --  76*  --  81*  HEPARINUNFRC 0.87*  --   --   --   --  0.59 0.47  CREATININE 0.69  --   --  0.58*  --   --  0.48*   < > = values in this interval not displayed.    Estimated Creatinine Clearance: 106.3 mL/min (A) (by C-G formula based on SCr of 0.48 mg/dL (L)).   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  Abd CT on 12/5 showed bowel obstruction. Patient was transitioned from Eliquis to heparin drip on admission since patient is NPO and in case surgical intervention is needed.   Baseline INR, aPTT: not done  Prior anticoagulation: Eliquis 5 mg bid, LD 12/4 at 1800  Significant events:  Today, 08/21/2018:  CBC: Hgb low but stable, Plt WNL  HL is 0.47, aPTT is 81, therapeutic. HL and aPTT are now correlating    Scr stable at 0.48 mg/dl  No bleeding or infusion issues per nursing  Goal of Therapy: Heparin level 0.3-0.7 units/ml Monitor platelets by anticoagulation protocol: Yes  Plan:  Continue heparin IV infusion at 1850 units/hr  Daily CBC and heparin level  with AM labs   Monitor for signs of bleeding or thrombosis  F/u GI plans as patient may need surgery in the near future; will need heparin stopped appropriately    Royetta Asal, PharmD, BCPS Pager [phone] 08/21/2018 12:14 PM

## 2018-08-21 NOTE — Progress Notes (Signed)
Nutrition Follow-up  DOCUMENTATION CODES:   Severe malnutrition in context of chronic illness  INTERVENTION:   Monitor magnesium, potassium, and phosphorus daily for at least 3 days, MD to replete as needed, as pt is at risk for refeeding syndrome given severe malnutrition, poor PO intakes for at least a week now, and low K levels.  -TPN per Pharmacy -Monitor for diet advancement -Once diet advancement, provide Ensure Enlive po BID, each supplement provides 350 kcal and 20 grams of protein  NUTRITION DIAGNOSIS:   Severe Malnutrition related to chronic illness, cancer and cancer related treatments as evidenced by energy intake < or equal to 75% for > or equal to 1 month, severe fat depletion, percent weight loss, mild muscle depletion.  Ongoing.  GOAL:   Patient will meet greater than or equal to 90% of their needs  Not meeting.  MONITOR:   PO intake, Supplement acceptance, Diet advancement, Labs, I & O's, Weight trends  REASON FOR ASSESSMENT:   Consult New TPN/TNA  ASSESSMENT:   Patient with PMH significant for HLD, DM, and high-grade B-cell lymphoma (on chemotherapy). Recently admitted 11/17 for SBO and treated medically. Presents this admission with recurrent SBO, sigmoid stenosis, and intraabdominal B cell lymphoma.   12/6: Initial nutrition assessment, NGT to suction, bowel rest, NPO  Patient has continued to be NPO since admission on 12/5. NGT still in place with 1.6L of output so far today. TPN is to start today at 40 ml/hr providing 1008 kcal and 57g protein. Continues to not have a BM (8 days).  Medications: D5 -.45% NaCl infusion at 60 ml/hr, KCl infusion Labs reviewed:   CBGs: 101-115 Low K  NUTRITION - FOCUSED PHYSICAL EXAM: From 12/6   Most Recent Value  Orbital Region  Moderate depletion  Upper Arm Region  Severe depletion  Thoracic and Lumbar Region  Unable to assess  Buccal Region  Severe depletion  Temple Region  Mild depletion  Clavicle Bone  Region  Mild depletion  Clavicle and Acromion Bone Region  Mild depletion  Scapular Bone Region  Unable to assess  Dorsal Hand  No depletion  Patellar Region  No depletion  Anterior Thigh Region  No depletion  Posterior Calf Region  No depletion  Edema (RD Assessment)  None  Hair  Reviewed  Eyes  Reviewed  Mouth  Reviewed  Skin  Reviewed  Nails  Reviewed      Diet Order:   Diet Order            Diet NPO time specified  Diet effective now              EDUCATION NEEDS:   Not appropriate for education at this time  Skin:  Skin Assessment: Reviewed RN Assessment  Last BM:  PTA- enema given  Height:   Ht Readings from Last 1 Encounters:  08/17/18 5' 9"  (1.753 m)    Weight:   Wt Readings from Last 1 Encounters:  08/19/18 90.1 kg    Ideal Body Weight:  72.7 kg  BMI:  Body mass index is 29.33 kg/m.  Estimated Nutritional Needs:   Kcal:  2400-2600 kcal  Protein:  115-130 grams  Fluid:  >/= 2.4 L/day   Clayton Bibles, MS, RD, LDN [place] [facility] Pager: [phone] After Hours Pager: [phone]

## 2018-08-21 NOTE — Progress Notes (Signed)
Patient ID: Mark Frey, male   DOB: 01/24/1956, 62 y.o.   MRN: [id number]    1 Day Post-Op  Subjective: Pt with no new complaints.  Not moving anything out of his backside.  High NGT output, but patient is eating multiple cups of ice a day.  Wife does most of the talking.  Not mobilizing much.  Objective: Vital signs in last 24 hours: Temp:  [97.4 F (36.3 C)-97.9 F (36.6 C)] 97.4 F (36.3 C) (12/09 0427) Pulse Rate:  [95-105] 95 (12/09 0427) Resp:  [16-20] 20 (12/09 0427) BP: (123-124)/(77-80) 123/77 (12/09 0427) SpO2:  [95 %-98 %] 97 % (12/09 0427) Last BM Date: 08/13/18  Intake/Output from previous day: 12/08 0701 - 12/09 0700 In: 366.2 [I.V.:363.5; IV Piggyback:2.8] Out: 3300 [Urine:1300; Emesis/NG output:2000] Intake/Output this shift: No intake/output data recorded.  PE: Heart: regular Lungs: CTAB Abd: soft, NT except mild discomfort across lower abdomen, +BS, NGT with some slightly watered-down bilious output.  Lab Results:  Recent Labs    08/20/18 0417 08/21/18 0418  WBC 5.0 5.0  HGB 8.5* 8.4*  HCT 28.9* 28.0*  PLT 224 176   BMET Recent Labs    08/20/18 0417 08/21/18 0418  NA 138 137  K 3.4* 3.2*  CL 103 103  CO2 27 27  GLUCOSE 113* 121*  BUN 11 10  CREATININE 0.58* 0.48*  CALCIUM 8.0* 7.9*   PT/INR No results for input(s): LABPROT, INR in the last 72 hours. CMP     Component Value Date/Time   NA 137 08/21/2018 0418   K 3.2 (L) 08/21/2018 0418   CL 103 08/21/2018 0418   CO2 27 08/21/2018 0418   GLUCOSE 121 (H) 08/21/2018 0418   BUN 10 08/21/2018 0418   CREATININE 0.48 (L) 08/21/2018 0418   CREATININE 0.60 (L) 08/11/2018 1210   CREATININE 1.26 12/01/2012 1700   CALCIUM 7.9 (L) 08/21/2018 0418   PROT 5.0 (L) 08/18/2018 0510   ALBUMIN 2.7 (L) 08/18/2018 0510   AST 11 (L) 08/18/2018 0510   AST 20 08/11/2018 1210   ALT 10 08/18/2018 0510   ALT 16 08/11/2018 1210   ALKPHOS 47 08/18/2018 0510   BILITOT 0.4 08/18/2018 0510   BILITOT  0.4 08/11/2018 1210   GFRNONAA >60 08/21/2018 0418   GFRNONAA >60 08/11/2018 1210   GFRAA >60 08/21/2018 0418   GFRAA >60 08/11/2018 1210   Lipase     Component Value Date/Time   LIPASE 19 08/17/2018 1738       Studies/Results: Dg Abd 2 Views  Result Date: 08/20/2018 CLINICAL DATA:  Pt was admitted for small bowel obstruction 2 days ago related to the lymphoma treated conservatively and had not moved his bowels for the last 7 days. Patient also noticed increasing bowel distention. Pt reported being diagnosed with a bowel obstruction and had a colonoscopy this morning. EXAM: ABDOMEN - 2 VIEW COMPARISON:  08/19/2018 and previous studies including a CT dated 08/17/2018. FINDINGS: There are dilated loops of small bowel with air-fluid levels. Dilation is similar to the previous day's exam. There is some air mixed with stool within a nondistended right colon. No free air. Nasogastric tube tip projects in the proximal stomach, unchanged. IMPRESSION: Persistent partial bowel obstruction.  No free air. Electronically Signed   By: Lajean Manes M.D.   On: 08/20/2018 17:42   [place] Ekg Site Rite  Result Date: 08/21/2018 If Site Rite image not attached, placement could not be confirmed due to current cardiac rhythm.   Anti-infectives:  Anti-infectives (From admission, onward)   Start     Dose/Rate Route Frequency Ordered Stop   08/18/18 1000  piperacillin-tazobactam (ZOSYN) IVPB 3.375 g     3.375 g 12.5 mL/hr over 240 Minutes Intravenous Every 8 hours 08/18/18 0858     08/17/18 2300  piperacillin-tazobactam (ZOSYN) IVPB 3.375 g     3.375 g 100 mL/hr over 30 Minutes Intravenous  Once 08/17/18 2254 08/18/18 0203       Assessment/Plan Intraabdominal B cell lymphoma EPOCH chemotherapy - [name] Recurrent bowel obstruction x 3, with sigmoid segmental thickening that has developed into stenosis,& dilated appendix; lymphoma VS diverticulitis VS appendicitis. DVT/bilateral pulmonary emboli; on  anticoagulation last dose 08/16/2018 -currently on heparin drip Type 2 diabetes Hypertension SPCM - will start TNA today for nutritional support as prealbumin is 6.  Recurrent small bowel obstruction/sigmoid stenosis -s/p c-scope yesterday which reveals extrinsic compression of the sigmoid colon causing obstruction.  Right side inconclusive as to if there is a source of obstruction causing a right-sided blockage as well near the ICV. -patient will need some type of surgical intervention, but his nutritional status is so poor with a prealbumin of 6 that he is not a candidate for a one stage procedure currently.  We will start him on TNA to increase his nutritional status so we can attempt a possible one stage procedure within the next one to several weeks, pending nutritional improvement.  Another option is if we operated this week, we would give the patient an ostomy and then we could discuss reversal in the future pending his status with his malignancy and needs for chemo/radiation, etc. -obviously a one stage procedure would be best for the patient probably; however, he won't be able to go home during this time to beef up because he has a blockage requiring NGT placement.  This was all discussed with the patient and his wife.   -patient does need to mobilize while here though.  Ambulate at least TID and get up to the chair. -Dr. Redmond Pulling to discuss this with patient and wife as well.   FEN - IVFs/TNA/NGT VTE - heparin gtt ID - zosyn, doubt he needs this as no infectious source identified, just bowel obstruction.   LOS: 4 days    Henreitta Cea , [facility] Surgery 08/21/2018, 9:37 AM Pager: [phone]

## 2018-08-21 NOTE — Progress Notes (Signed)
Patient had medium sized bowel movement, and is passing gas.

## 2018-08-21 NOTE — Progress Notes (Signed)
PROGRESS NOTE  Mark Frey CWC:[id number] DOB: 1956-06-03 DOA: 08/17/2018 PCP: [name], MD  HPI/Recap of past 24 hours: HPI from Dr Jenny Reichmann is a 62 y.o. male with history of lymphoma, DM, Hx of DVT, who was recently admitted for small bowel obstruction related to the lymphoma treated conservatively presents to the ER because he has not moved bowels for last 7 days.  Patient also noticed increasing bowel distention. Denies any vomiting or abdominal pain. In the ER CAT scan shows bowel obstruction for which patient has been placed on NG tube suction.  On-call general surgeon has been consulted and admitted for further management.   Today, patient denies any new complaints. Wife at bedside. Passing gas and had a BM today.  Assessment/Plan: Principal Problem:   Bowel obstruction in setting of lymphoma and sigmoid stricture Active Problems:   Morbid obesity ([place])   Controlled diabetes mellitus type II without complication (HCC)   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HCC)   Deep vein thrombosis (DVT) of femoral vein of right lower extremity (HCC)   Protein-calorie malnutrition, severe   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HCC)   Abnormal CT of the abdomen  Recurrent small bowel obstruction CT abdomen/pelvis showed: Diffusely dilated small bowel may represent an ileus or a degree of SBO at the level of the terminal ileum secondary to adhesions. Segmental thickening of the sigmoid colon with associated luminal narrowing and stricture and resulting obstruction at the level of the sigmoid colon. Findings likely related to post treatment changes although a colonic mass is not entirely excluded General surgery on board, plan for surgery, awaiting more details later today. Started TPN on 08/21/18 GI on board, appreciate recs: Had colonoscopy on 08/20/18 showed high-grade extrinsic compression of the sigmoid colon. No mass seen. Rec possible surgery   N.p.o., IV fluids, NG tube, TPN,  pain management IV Zosyn as per GI for ??ppx (may stop after 5 days, on 08/22/18) as there is not signs of any infection Monitor closely  Diabetes mellitus type 2 Had an episode of hypoglycemia 08/19/18, will change IVF to d5W/1/2NS  SSI, Accu-Cheks, hypoglycemic protocol  History of DVT Hold home apixaban, continue heparin drip pending procedures/surgery  High-grade cell lymphoma Diagnosed in July, completed chemo late October Followed by oncologist [name]         Malnutrition Type:  Nutrition Problem: Severe Malnutrition Etiology: chronic illness, cancer and cancer related treatments   Malnutrition Characteristics:  Signs/Symptoms: energy intake < or equal to 75% for > or equal to 1 month, severe fat depletion, percent weight loss, mild muscle depletion   Nutrition Interventions:  Interventions: Ensure Enlive (each supplement provides 350kcal and 20 grams of protein), Refer to RD note for recommendations, MVI    Estimated body mass index is 29.33 kg/m as calculated from the following:   Height as of this encounter: 5' 9"  (1.753 m).   Weight as of this encounter: 90.1 kg.     Code Status: Full  Family Communication: Wife at bedside  Disposition Plan: To be determined pending work up   Consultants:  General surgery  GI  Procedures:  None  Antimicrobials:  IV Zosyn  DVT prophylaxis: Heparin drip   Objective: Vitals:   08/20/18 1618 08/20/18 2029 08/21/18 0427 08/21/18 1354  BP: 123/77 124/77 123/77 132/84  Pulse: (!) 103 (!) 103 95 (!) 102  Resp: 16 20 20 14   Temp: 97.9 F (36.6 C) 97.7 F (36.5 C) (!) 97.4 F (36.3 C) (!)  97.3 F (36.3 C)  TempSrc: Oral Oral Oral   SpO2: 98% 95% 97% 95%  Weight:      Height:        Intake/Output Summary (Last 24 hours) at 08/21/2018 1504 Last data filed at 08/21/2018 1338 Gross per 24 hour  Intake 366.23 ml  Output 4301 ml  Net -3934.77 ml   Filed Weights   08/17/18 1555 08/19/18 0700    Weight: 90.3 kg 90.1 kg    Exam  General: NAD   Cardiovascular: S1, S2 present  Respiratory: CTAB  Abdomen: Soft, nontender, nondistended, hypoactive bowel sounds  Musculoskeletal: No bilateral pedal edema noted  Skin: Normal  Psychiatry: Normal mood   Data Reviewed: CBC: Recent Labs  Lab 08/17/18 1738 08/18/18 0510 08/19/18 0607 08/20/18 0417 08/21/18 0418  WBC 7.2 5.1 5.8 5.0 5.0  NEUTROABS 4.1 2.8 3.4 3.2 2.7  HGB 9.6* 8.6* 9.3* 8.5* 8.4*  HCT 31.7* 29.1* 32.0* 28.9* 28.0*  MCV 98.4 100.0 101.6* 101.4* 101.4*  PLT 214 203 213 224 967   Basic Metabolic Panel: Recent Labs  Lab 08/17/18 1738 08/18/18 0510 08/19/18 0607 08/20/18 0417 08/21/18 0418  NA 136 136 140 138 137  K 4.3 3.8 3.8 3.4* 3.2*  CL 97* 101 102 103 103  CO2 29 26 26 27 27   GLUCOSE 113* 102* 89 113* 121*  BUN 13 12 12 11 10   CREATININE 0.64 0.54* 0.69 0.58* 0.48*  CALCIUM 8.7* 8.3* 8.5* 8.0* 7.9*   GFR: Estimated Creatinine Clearance: 106.3 mL/min (A) (by C-G formula based on SCr of 0.48 mg/dL (L)). Liver Function Tests: Recent Labs  Lab 08/17/18 1738 08/18/18 0510  AST 13* 11*  ALT 13 10  ALKPHOS 56 47  BILITOT 0.5 0.4  PROT 5.7* 5.0*  ALBUMIN 2.7* 2.7*   Recent Labs  Lab 08/17/18 1738  LIPASE 19   No results for input(s): AMMONIA in the last 168 hours. Coagulation Profile: No results for input(s): INR, PROTIME in the last 168 hours. Cardiac Enzymes: No results for input(s): CKTOTAL, CKMB, CKMBINDEX, TROPONINI in the last 168 hours. BNP (last 3 results) No results for input(s): PROBNP in the last 8760 hours. HbA1C: No results for input(s): HGBA1C in the last 72 hours. CBG: Recent Labs  Lab 08/20/18 2021 08/20/18 2336 08/21/18 0417 08/21/18 0747 08/21/18 1212  GLUCAP 104* 109* 85 115* 101*   Lipid Profile: No results for input(s): CHOL, HDL, LDLCALC, TRIG, CHOLHDL, LDLDIRECT in the last 72 hours. Thyroid Function Tests: No results for input(s): TSH,  T4TOTAL, FREET4, T3FREE, THYROIDAB in the last 72 hours. Anemia Panel: No results for input(s): VITAMINB12, FOLATE, FERRITIN, TIBC, IRON, RETICCTPCT in the last 72 hours. Urine analysis:    Component Value Date/Time   COLORURINE AMBER (A) 08/17/2018 1624   APPEARANCEUR CLEAR 08/17/2018 1624   LABSPEC >1.046 (H) 08/17/2018 1624   PHURINE 5.0 08/17/2018 1624   GLUCOSEU NEGATIVE 08/17/2018 1624   HGBUR NEGATIVE 08/17/2018 1624   HGBUR negative 01/20/2009 0840   BILIRUBINUR NEGATIVE 08/17/2018 1624   BILIRUBINUR Small 01/30/2018 1652   KETONESUR NEGATIVE 08/17/2018 1624   PROTEINUR NEGATIVE 08/17/2018 1624   UROBILINOGEN 2.0 (A) 01/30/2018 1652   UROBILINOGEN 0.2 01/20/2009 0840   NITRITE NEGATIVE 08/17/2018 1624   LEUKOCYTESUR NEGATIVE 08/17/2018 1624   Sepsis Labs: @LABRCNTIP (procalcitonin:4,lacticidven:4)  )No results found for this or any previous visit (from the past 240 hour(s)).    Studies: [place] Ekg Site Rite  Result Date: 08/21/2018 If [employer] not attached, placement could  not be confirmed due to current cardiac rhythm.   Scheduled Meds: . insulin aspart  0-9 Units Subcutaneous Q4H    Continuous Infusions: . dextrose 5 % and 0.45% NaCl 100 mL/hr at 08/21/18 0954  . dextrose 5 % and 0.45% NaCl    . heparin 1,850 Units/hr (08/21/18 0558)  . piperacillin-tazobactam (ZOSYN)  IV 3.375 g (08/21/18 0914)  . TPN ADULT (ION)       LOS: 4 days     Alma Friendly, MD [facility]  If 7PM-7AM, please contact night-coverage [email or web address] 08/21/2018, 3:04 PM

## 2018-08-22 ENCOUNTER — Inpatient Hospital Stay (HOSPITAL_COMMUNITY): Payer: BLUE CROSS/BLUE SHIELD

## 2018-08-22 ENCOUNTER — Encounter (HOSPITAL_COMMUNITY): Payer: Self-pay | Admitting: *Deleted

## 2018-08-22 LAB — COMPREHENSIVE METABOLIC PANEL
ALT: 8 U/L (ref 0–44)
AST: 8 U/L — ABNORMAL LOW (ref 15–41)
Albumin: 2.2 g/dL — ABNORMAL LOW (ref 3.5–5.0)
Alkaline Phosphatase: 39 U/L (ref 38–126)
Anion gap: 7 (ref 5–15)
BUN: 9 mg/dL (ref 8–23)
CALCIUM: 8 mg/dL — AB (ref 8.9–10.3)
CO2: 27 mmol/L (ref 22–32)
Chloride: 103 mmol/L (ref 98–111)
Creatinine, Ser: 0.53 mg/dL — ABNORMAL LOW (ref 0.61–1.24)
GFR calc Af Amer: >60 mL/min (ref >60)
GFR calc non Af Amer: >60 mL/min (ref >60)
Glucose, Bld: 148 mg/dL — ABNORMAL HIGH (ref 70–99)
Potassium: 3.4 mmol/L — ABNORMAL LOW (ref 3.5–5.1)
Sodium: 137 mmol/L (ref 135–145)
Total Bilirubin: 0.4 mg/dL (ref 0.3–1.2)
Total Protein: 4.5 g/dL — ABNORMAL LOW (ref 6.5–8.1)

## 2018-08-22 LAB — CBC WITH DIFFERENTIAL/PLATELET
Abs Immature Granulocytes: 0.03 10*3/uL (ref 0.00–0.07)
Basophils Absolute: 0 10*3/uL (ref 0.0–0.1)
Basophils Relative: 0 %
EOS ABS: 0 10*3/uL (ref 0.0–0.5)
Eosinophils Relative: 1 %
HCT: 28.3 % — ABNORMAL LOW (ref 39.0–52.0)
Hemoglobin: 8.2 g/dL — ABNORMAL LOW (ref 13.0–17.0)
Immature Granulocytes: 1 %
Lymphocytes Relative: 39 %
Lymphs Abs: 1.8 10*3/uL (ref 0.7–4.0)
MCH: 29.8 pg (ref 26.0–34.0)
MCHC: 29 g/dL — ABNORMAL LOW (ref 30.0–36.0)
MCV: 102.9 fL — ABNORMAL HIGH (ref 80.0–100.0)
Monocytes Absolute: 0.6 10*3/uL (ref 0.1–1.0)
Monocytes Relative: 12 %
Neutro Abs: 2.2 10*3/uL (ref 1.7–7.7)
Neutrophils Relative %: 47 %
Platelets: 194 10*3/uL (ref 150–400)
RBC: 2.75 MIL/uL — ABNORMAL LOW (ref 4.22–5.81)
RDW: 19 % — ABNORMAL HIGH (ref 11.5–15.5)
WBC: 4.7 10*3/uL (ref 4.0–10.5)
nRBC: 0 % (ref 0.0–0.2)

## 2018-08-22 LAB — GLUCOSE, CAPILLARY
GLUCOSE-CAPILLARY: 141 mg/dL — AB (ref 70–99)
Glucose-Capillary: 113 mg/dL — ABNORMAL HIGH (ref 70–99)
Glucose-Capillary: 125 mg/dL — ABNORMAL HIGH (ref 70–99)
Glucose-Capillary: 128 mg/dL — ABNORMAL HIGH (ref 70–99)
Glucose-Capillary: 140 mg/dL — ABNORMAL HIGH (ref 70–99)

## 2018-08-22 LAB — MAGNESIUM: Magnesium: 1.8 mg/dL (ref 1.7–2.4)

## 2018-08-22 LAB — HEPARIN LEVEL (UNFRACTIONATED): Heparin Unfractionated: 0.34 IU/mL (ref 0.30–0.70)

## 2018-08-22 LAB — PREALBUMIN: Prealbumin: <5 mg/dL — ABNORMAL LOW (ref 18–38)

## 2018-08-22 LAB — TRIGLYCERIDES: Triglycerides: 110 mg/dL (ref <150)

## 2018-08-22 LAB — PHOSPHORUS: Phosphorus: 3 mg/dL (ref 2.5–4.6)

## 2018-08-22 MED ORDER — FAMOTIDINE IN NACL 20-0.9 MG/50ML-% IV SOLN
20.0000 mg | Freq: Two times a day (BID) | INTRAVENOUS | Status: DC
  Administered 2018-08-22 – 2018-08-25 (×7): 20 mg via INTRAVENOUS
  Filled 2018-08-22 (×9): qty 50

## 2018-08-22 MED ORDER — IOPAMIDOL (ISOVUE-300) INJECTION 61%
100.0000 mL | Freq: Once | INTRAVENOUS | Status: AC | PRN
  Administered 2018-08-22: 100 mL via INTRAVENOUS

## 2018-08-22 MED ORDER — INSULIN ASPART 100 UNIT/ML ~~LOC~~ SOLN
0.0000 [IU] | Freq: Four times a day (QID) | SUBCUTANEOUS | Status: DC
  Administered 2018-08-23 – 2018-08-26 (×6): 1 [IU] via SUBCUTANEOUS
  Administered 2018-08-26 (×2): 2 [IU] via SUBCUTANEOUS
  Administered 2018-08-26: 1 [IU] via SUBCUTANEOUS
  Administered 2018-08-27: 2 [IU] via SUBCUTANEOUS

## 2018-08-22 MED ORDER — IOPAMIDOL (ISOVUE-300) INJECTION 61%
INTRAVENOUS | Status: AC
  Filled 2018-08-22: qty 100

## 2018-08-22 MED ORDER — TRAVASOL 10 % IV SOLN
INTRAVENOUS | Status: AC
  Administered 2018-08-22: 20:00:00 via INTRAVENOUS
  Filled 2018-08-22: qty 924

## 2018-08-22 MED ORDER — DEXTROSE-NACL 5-0.45 % IV SOLN
INTRAVENOUS | Status: AC
  Administered 2018-08-23: 03:00:00 via INTRAVENOUS

## 2018-08-22 MED ORDER — SODIUM CHLORIDE (PF) 0.9 % IJ SOLN
INTRAMUSCULAR | Status: AC
  Filled 2018-08-22: qty 50

## 2018-08-22 MED ORDER — MAGNESIUM SULFATE 2 GM/50ML IV SOLN
2.0000 g | Freq: Once | INTRAVENOUS | Status: AC
  Administered 2018-08-22: 2 g via INTRAVENOUS
  Filled 2018-08-22: qty 50

## 2018-08-22 MED ORDER — POTASSIUM CHLORIDE 10 MEQ/100ML IV SOLN
10.0000 meq | INTRAVENOUS | Status: AC
  Administered 2018-08-22 (×2): 10 meq via INTRAVENOUS
  Filled 2018-08-22 (×2): qty 100

## 2018-08-22 NOTE — Progress Notes (Signed)
[place] for heparin Indication: hx pulmonary embolus and DVT (home Eliquis on hold)  Allergies  Allergen Reactions  . Ciprofloxacin Other (See Comments)    Leg tingling    Patient Measurements: Height: 5' 9"  (175.3 cm) Weight: 200 lb 2.8 oz (90.8 kg) IBW/kg (Calculated) : 70.7 Heparin Dosing Weight: 89 kg  Vital Signs: Temp: 97.9 F (36.6 C) (12/10 0400) Temp Source: Oral (12/10 0400) BP: 129/75 (12/10 0400) Pulse Rate: 90 (12/10 0400)  Labs: Recent Labs    08/19/18 2257  08/20/18 0417 08/20/18 1021 08/20/18 1121 08/21/18 0418 08/22/18 0633  HGB  --    < > 8.5*  --   --  8.4* 8.2*  HCT  --   --  28.9*  --   --  28.0* 28.3*  PLT  --   --  224  --   --  176 194  APTT 189*  --   --  76*  --  81*  --   HEPARINUNFRC  --   --   --   --  0.59 0.47 0.34  CREATININE  --   --  0.58*  --   --  0.48* 0.53*   < > = values in this interval not displayed.    Estimated Creatinine Clearance: 106.6 mL/min (A) (by C-G formula based on SCr of 0.53 mg/dL (L)).   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  Abd CT on 12/5 showed bowel obstruction. Patient was transitioned from Eliquis to heparin drip on admission since patient is NPO and in case surgical intervention is needed.   Baseline INR, aPTT: not done  Prior anticoagulation: Eliquis 5 mg bid, LD 12/4 at 1800  Significant events:  Today, 08/22/2018:  CBC: Hgb low but stable, Plt WNL  HL is 0.34, therapeutic.   Scr stable at 0.53 mg/dl  No bleeding or infusion issues per nursing  Goal of Therapy: Heparin level 0.3-0.7 units/ml Monitor platelets by anticoagulation protocol: Yes  Plan:  Continue heparin IV infusion at 1850 units/hr  Daily CBC and heparin level with AM labs   Monitor for signs of bleeding  or thrombosis  F/u GI plans as patient may need surgery in the near future; will need heparin stopped appropriately    Royetta Asal, PharmD, BCPS Pager [phone] 08/22/2018 2:00 PM

## 2018-08-22 NOTE — Progress Notes (Signed)
PHARMACY - ADULT TOTAL PARENTERAL NUTRITION CONSULT NOTE   Pharmacy Consult for TPN  Indication: bowel obstruction  Patient Measurements: Height: 5' 9"  (175.3 cm) Weight: 200 lb 2.8 oz (90.8 kg) IBW/kg (Calculated) : 70.7 TPN AdjBW (KG): 75.6 Body mass index is 29.56 kg/m. Usual Weight:     Insulin Requirements: on sensitive SSI q4h, 1 unit in last 24 hours   Current Nutrition: NPO   IVF: D5-NS at 60 ml/hr   Central access:  Port placed 03/27/18  TPN start date: 08/21/2018   ASSESSMENT                                                                                                          HPI: Pharmacy is consulted to start TPN on 62 yo male with a diagnosis of SBO. Pt will need a surgical intervention and needs to improve nutritional status prior to any procedure as prealbumin is 6.   Significant events:   12/9 Start TPN  Today:   Glucose ( goal <150)  - WNL   Electrolytes - K+ 3.4, Calcium is 8, but Corrected Calcium is 9, low end of normal, magnesium 1.8 low end of normal other labs WNL and stable   Renal - SCr stable, WNL  LFTs - WNL     TGs - 110 ( 12/10)    Prealbumin - <5 ( 12/10)  NUTRITIONAL GOALS                                                                                             RD recs: ( 12/9)  Estimated Nutritional Needs:   Kcal:  2400-2600 kcal  Protein:  115-130 grams  Fluid:  >/= 2.4 L/day   Potassium chloride 10 meq IV x 2 ( MD)   Magnesium sulfate 2 gr IV x1   PLAN                                                                                                                         At 1800 today:  Increase TPN to 70 ml/hr.  Initial TPN contains 57.6 grams of protein, 144 grams of dextrose and 28.8 grams of lipid, providing  1008 Kcal.   Plan to advance as tolerated to the goal rate of 100 ml/hr  Target goal rate TPN will provide 2442 Kcal, 132 grams of protein, 360 grams of dextrose and 72 grams of lipids.   Increased  potassium, magnesium and calcium in bag by small amounts   TPN to contain standard multivitamins and trace elements.  Reduce IVF to 30 ml/hr at 1800  Decrease SSI to q6h.   BMP, magnesium, phosphate with AM labs   TPN lab panels on Mondays & Thursdays.  F/u daily.   Royetta Asal, PharmD, BCPS Pager [phone] 08/22/2018 11:27 AM

## 2018-08-22 NOTE — Progress Notes (Addendum)
I spoke with Nicki Reaper RPD. I will top the TPN before Mr Cleland goes to the CT scan so iv contrast cna be infused via the port'  Th first bottle of contrast was was administered over 20 min AT 1700 via the NG tube. . The 2 ND bottle was administered via the NG tube 1720-1740. The last bottle  1/2 administered over 10 min.last 1/2 bottle was infused at 1800 followed by 30 ml flush. Pt tolerated  the procedure well

## 2018-08-22 NOTE — Progress Notes (Signed)
Patient ID: Mark Frey, male   DOB: July 29, 1956, 62 y.o.   MRN: [id number]    2 Days Post-Op  Subjective: Patient feels ok today.  No new complaints.  Actually just had a BM this morning.  States he had 2 last night as well.  These are his first BMs in many days.  Objective: Vital signs in last 24 hours: Temp:  [97.3 F (36.3 C)-98.5 F (36.9 C)] 97.9 F (36.6 C) (12/10 0400) Pulse Rate:  [90-102] 90 (12/10 0400) Resp:  [14-20] 20 (12/10 0400) BP: (121-132)/(75-84) 129/75 (12/10 0400) SpO2:  [95 %-98 %] 96 % (12/10 0400) Weight:  [90.8 kg] 90.8 kg (12/09 1140) Last BM Date: 08/13/18  Intake/Output from previous day: 12/09 0701 - 12/10 0700 In: 2040.2 [I.V.:1851.7; IV Piggyback:188.5] Out: 0254 [Urine:1050; Emesis/NG output:600; Stool:1] Intake/Output this shift: No intake/output data recorded.  PE: Heart: regular Lungs: CTAB Abd: soft, NT, ND, +BS, NGT with some blood stained output.  Lab Results:  Recent Labs    08/21/18 0418 08/22/18 0633  WBC 5.0 4.7  HGB 8.4* 8.2*  HCT 28.0* 28.3*  PLT 176 194   BMET Recent Labs    08/21/18 0418 08/22/18 0633  NA 137 137  K 3.2* 3.4*  CL 103 103  CO2 27 27  GLUCOSE 121* 148*  BUN 10 9  CREATININE 0.48* 0.53*  CALCIUM 7.9* 8.0*   PT/INR No results for input(s): LABPROT, INR in the last 72 hours. CMP     Component Value Date/Time   NA 137 08/22/2018 0633   K 3.4 (L) 08/22/2018 0633   CL 103 08/22/2018 0633   CO2 27 08/22/2018 0633   GLUCOSE 148 (H) 08/22/2018 0633   BUN 9 08/22/2018 0633   CREATININE 0.53 (L) 08/22/2018 0633   CREATININE 0.60 (L) 08/11/2018 1210   CREATININE 1.26 12/01/2012 1700   CALCIUM 8.0 (L) 08/22/2018 0633   PROT 4.5 (L) 08/22/2018 0633   ALBUMIN 2.2 (L) 08/22/2018 0633   AST 8 (L) 08/22/2018 0633   AST 20 08/11/2018 1210   ALT 8 08/22/2018 0633   ALT 16 08/11/2018 1210   ALKPHOS 39 08/22/2018 0633   BILITOT 0.4 08/22/2018 0633   BILITOT 0.4 08/11/2018 1210   GFRNONAA >60  08/22/2018 0633   GFRNONAA >60 08/11/2018 1210   GFRAA >60 08/22/2018 0633   GFRAA >60 08/11/2018 1210   Lipase     Component Value Date/Time   LIPASE 19 08/17/2018 1738       Studies/Results: Dg Abd 2 Views  Result Date: 08/20/2018 CLINICAL DATA:  Pt was admitted for small bowel obstruction 2 days ago related to the lymphoma treated conservatively and had not moved his bowels for the last 7 days. Patient also noticed increasing bowel distention. Pt reported being diagnosed with a bowel obstruction and had a colonoscopy this morning. EXAM: ABDOMEN - 2 VIEW COMPARISON:  08/19/2018 and previous studies including a CT dated 08/17/2018. FINDINGS: There are dilated loops of small bowel with air-fluid levels. Dilation is similar to the previous day's exam. There is some air mixed with stool within a nondistended right colon. No free air. Nasogastric tube tip projects in the proximal stomach, unchanged. IMPRESSION: Persistent partial bowel obstruction.  No free air. Electronically Signed   By: Lajean Manes M.D.   On: 08/20/2018 17:42   [place] Ekg Site Rite  Result Date: 08/21/2018 If Site Rite image not attached, placement could not be confirmed due to current cardiac rhythm.   Anti-infectives: Anti-infectives (  From admission, onward)   Start     Dose/Rate Route Frequency Ordered Stop   08/18/18 1000  piperacillin-tazobactam (ZOSYN) IVPB 3.375 g     3.375 g 12.5 mL/hr over 240 Minutes Intravenous Every 8 hours 08/18/18 0858     08/17/18 2300  piperacillin-tazobactam (ZOSYN) IVPB 3.375 g     3.375 g 100 mL/hr over 30 Minutes Intravenous  Once 08/17/18 2254 08/18/18 0203       Assessment/Plan Intraabdominal B cell lymphoma EPOCH chemotherapy - [name] Recurrent bowel obstruction x 3,with sigmoid segmental thickening that has developed into stenosis,&dilated appendix; lymphoma VS diverticulitis VS appendicitis. DVT/bilateral pulmonary emboli;on anticoagulation last dose 08/16/2018  -currently on heparin drip Type 2 diabetes Hypertension SPCM - will start TNA today for nutritional support as prealbumin is 6.  Recurrent small bowel obstruction/sigmoid stenosis -s/p c-scope yesterday which reveals extrinsic compression of the sigmoid colon causing obstruction.  Right side inconclusive as to if there is a source of obstruction causing a right-sided blockage as well near the ICV.  Will order a CT enterography today to see if this reveals anything at the TI which if so, would greatly change operation plans. -continued discussions need to be had between Dr. Redmond Pulling and patient and wife regarding plans, but at this time we are leaning towards likely an operation this week as opposed to waiting. -cont to mobilize -cont TNA for now while NGT in place  Gastritis -some old blood noted in NGT output.  On heparin gtt -will add pepcid 20 mg BID to help with this while NGT in place.   FEN - IVFs/TNA/NGT VTE - heparin gtt ID - zosyn, per primary service.   LOS: 5 days    Henreitta Cea , [facility] 08/22/2018, 9:11 AM Pager: [phone]

## 2018-08-22 NOTE — Progress Notes (Signed)
PROGRESS NOTE    Mark Frey  ULA:[id number] DOB: 06-30-1956 DOA: 08/17/2018 PCP: [name], MD   Brief Narrative:  HPI from Dr Arnette Norris [name] 62 y.o.malewithhistory of lymphoma, DM, Hx of DVT, who was recently admitted for small bowel obstruction related to the lymphoma treated conservatively presents to the ER because he has not moved bowels for last 7 days. Patient also noticed increasing bowel distention. Denies any vomiting or abdominal pain. In the ER CAT scan shows bowel obstruction for which patient has been placed on NG tube suction. On-call general surgeon has been consulted and admitted for further management.  Assessment & Plan:   Principal Problem:   Bowel obstruction in setting of lymphoma and sigmoid stricture Active Problems:   Morbid obesity (Mark Frey)   Controlled diabetes mellitus type II without complication (HCC)   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HCC)   Deep vein thrombosis (DVT) of femoral vein of right lower extremity (HCC)   Protein-calorie malnutrition, severe   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HCC)   Abnormal CT of the abdomen   Recurrent small bowel obstruction CT abdomen/pelvis showed: Diffusely dilated small bowel may represent an ileus or Mark Frey degree of SBO at the level of the terminal ileum secondary to adhesions. Segmental thickening of the sigmoid colon with associated luminal narrowing and stricture and resulting obstruction at the level of the sigmoid colon. Findings likely related to post treatment changes although Mark Frey colonic mass is not entirely excluded Started TPN on 08/21/18 Surgery c/s, appreciate recs, planning for CT enterography.  Planning for OR on Friday tentatively. GI on board, appreciate recs: Had colonoscopy on 08/20/18 showed high-grade extrinsic compression of the sigmoid colon. No mass seen. Rec possible surgery   N.p.o., IV fluids, NG tube, TPN, pain management IV Zosyn discontinued Monitor closely  Diabetes  mellitus type 2 Had an episode of hypoglycemia 08/19/18, will change IVF to d5W/1/2NS  SSI, Accu-Cheks, hypoglycemic protocol  History of DVT Hold home apixaban, continue heparin drip pending procedures/surgery  High-grade cell lymphoma Diagnosed in July, completed chemo late October [name] following  Hypokalemia:  Replace, follow  DVT prophylaxis: heparin gtt Code Status: full code Family Communication: wife at bedside Disposition Plan: pending  Consultants:   Surgery  oncology  Procedures:  Colonoscopy 12/8 1. High-grade extrinsic compression of the sigmoid colon as described 2. Colonoscopy completed to the level of the cecum without additional obstructing lesions noted. Examination of the mucosa was compromised by poor preparation. This patient could not properly prep for colonoscopy given ileal and colonic obstructive processes. Impression: Moderate (conscious) sedation was administered by the endoscopy nurse and supervised by the endoscopist. The patient's oxygen saturation, heart rate, blood pressure and response to care were monitored. Total physician intraservice time was 15 minutes. Moderate Sedation: 1. Continue NG decompression 2. I believe that this patient will require surgery to address at least the extrinsic sigmoid colon stenosis and possibly the suspected process in the region of the ileum 3. Discussed with patient and family. GI will sign off but we certainly are available if we can be of further assistance or for any questions. Thank you   Antimicrobials: Anti-infectives (From admission, onward)   Start     Dose/Rate Route Frequency Ordered Stop   08/18/18 1000  piperacillin-tazobactam (ZOSYN) IVPB 3.375 g  Status:  Discontinued     3.375 g 12.5 mL/hr over 240 Minutes Intravenous Every 8 hours 08/18/18 0858 08/22/18 1255   08/17/18 2300  piperacillin-tazobactam (ZOSYN) IVPB 3.375  g     3.375 g 100 mL/hr over 30 Minutes Intravenous  Once  08/17/18 2254 08/18/18 0203         Subjective: No complaints Passing gas BM last night  Objective: Vitals:   08/21/18 1140 08/21/18 1354 08/21/18 2013 08/22/18 0400  BP:  132/84 121/78 129/75  Pulse:  (!) 102 (!) 101 90  Resp:  14 19 20   Temp:  (!) 97.3 F (36.3 C) 98.5 F (36.9 C) 97.9 F (36.6 C)  TempSrc:   Oral Oral  SpO2:  95% 98% 96%  Weight: 90.8 kg     Height:        Intake/Output Summary (Last 24 hours) at 08/22/2018 1100 Last data filed at 08/22/2018 1020 Gross per 24 hour  Intake 2040.19 ml  Output 1651 ml  Net 389.19 ml   Filed Weights   08/17/18 1555 08/19/18 0700 08/21/18 1140  Weight: 90.3 kg 90.1 kg 90.8 kg    Examination:  General exam: Appears calm and comfortable  Respiratory system: Clear to auscultation. Respiratory effort normal. Cardiovascular system: S1 & S2 heard, RRR. No JVD, murmurs, rubs, gallops or clicks. No pedal edema. Gastrointestinal system: Abdomen is nondistended, soft and nontender.  NG in place.  No bowel sounds heard. Central nervous system: Alert and oriented. No focal neurological deficits. Extremities: no lee Skin: No rashes, lesions or ulcers Psychiatry: Judgement and insight appear normal. Mood & affect appropriate.     Data Reviewed: I have personally reviewed following labs and imaging studies  CBC: Recent Labs  Lab 08/18/18 0510 08/19/18 0607 08/20/18 0417 08/21/18 0418 08/22/18 0633  WBC 5.1 5.8 5.0 5.0 4.7  NEUTROABS 2.8 3.4 3.2 2.7 2.2  HGB 8.6* 9.3* 8.5* 8.4* 8.2*  HCT 29.1* 32.0* 28.9* 28.0* 28.3*  MCV 100.0 101.6* 101.4* 101.4* 102.9*  PLT 203 213 224 176 553   Basic Metabolic Panel: Recent Labs  Lab 08/18/18 0510 08/19/18 0607 08/20/18 0417 08/21/18 0418 08/22/18 0633  NA 136 140 138 137 137  K 3.8 3.8 3.4* 3.2* 3.4*  CL 101 102 103 103 103  CO2 26 26 27 27 27   GLUCOSE 102* 89 113* 121* 148*  BUN 12 12 11 10 9   CREATININE 0.54* 0.69 0.58* 0.48* 0.53*  CALCIUM 8.3* 8.5* 8.0* 7.9*  8.0*  MG  --   --   --   --  1.8  PHOS  --   --   --   --  3.0   GFR: Estimated Creatinine Clearance: 106.6 mL/min (Mark Frey) (by C-G formula based on SCr of 0.53 mg/dL (L)). Liver Function Tests: Recent Labs  Lab 08/17/18 1738 08/18/18 0510 08/22/18 0633  AST 13* 11* 8*  ALT 13 10 8   ALKPHOS 56 47 39  BILITOT 0.5 0.4 0.4  PROT 5.7* 5.0* 4.5*  ALBUMIN 2.7* 2.7* 2.2*   Recent Labs  Lab 08/17/18 1738  LIPASE 19   No results for input(s): AMMONIA in the last 168 hours. Coagulation Profile: No results for input(s): INR, PROTIME in the last 168 hours. Cardiac Enzymes: No results for input(s): CKTOTAL, CKMB, CKMBINDEX, TROPONINI in the last 168 hours. BNP (last 3 results) No results for input(s): PROBNP in the last 8760 hours. HbA1C: No results for input(s): HGBA1C in the last 72 hours. CBG: Recent Labs  Lab 08/21/18 1736 08/21/18 2013 08/22/18 0034 08/22/18 0359 08/22/18 0758  GLUCAP 103* 117* 128* 141* 140*   Lipid Profile: Recent Labs    08/22/18 0633  TRIG 110  Thyroid Function Tests: No results for input(s): TSH, T4TOTAL, FREET4, T3FREE, THYROIDAB in the last 72 hours. Anemia Panel: No results for input(s): VITAMINB12, FOLATE, FERRITIN, TIBC, IRON, RETICCTPCT in the last 72 hours. Sepsis Labs: No results for input(s): PROCALCITON, LATICACIDVEN in the last 168 hours.  No results found for this or any previous visit (from the past 240 hour(s)).       Radiology Studies: Dg Abd 2 Views  Result Date: 08/20/2018 CLINICAL DATA:  Pt was admitted for small bowel obstruction 2 days ago related to the lymphoma treated conservatively and had not moved his bowels for the last 7 days. Patient also noticed increasing bowel distention. Pt reported being diagnosed with Mark Frey bowel obstruction and had Mark Frey colonoscopy this morning. EXAM: ABDOMEN - 2 VIEW COMPARISON:  08/19/2018 and previous studies including Mark Frey CT dated 08/17/2018. FINDINGS: There are dilated loops of small bowel with  air-fluid levels. Dilation is similar to the previous day's exam. There is some air mixed with stool within Mark Frey nondistended right colon. No free air. Nasogastric tube tip projects in the proximal stomach, unchanged. IMPRESSION: Persistent partial bowel obstruction.  No free air. Electronically Signed   By: Lajean Manes M.D.   On: 08/20/2018 17:42   [place] Ekg Site Rite  Result Date: 08/21/2018 If Site Rite image not attached, placement could not be confirmed due to current cardiac rhythm.       Scheduled Meds: . insulin aspart  0-9 Units Subcutaneous Q4H   Continuous Infusions: . dextrose 5 % and 0.45% NaCl 60 mL/hr at 08/22/18 0216  . famotidine (PEPCID) IV    . heparin 1,850 Units/hr (08/22/18 0944)  . piperacillin-tazobactam (ZOSYN)  IV 3.375 g (08/22/18 0954)  . potassium chloride    . TPN ADULT (ION) 40 mL/hr at 08/21/18 1845     LOS: 5 days    Time spent: over 52 min    Fayrene Helper, MD [facility] Pager [phone]  If 7PM-7AM, please contact night-coverage [email or web address] Password [email or web address] 08/22/2018, 11:00 AM

## 2018-08-23 ENCOUNTER — Encounter

## 2018-08-23 ENCOUNTER — Ambulatory Visit: Payer: BLUE CROSS/BLUE SHIELD | Admitting: Physician Assistant

## 2018-08-23 LAB — CBC WITH DIFFERENTIAL/PLATELET
Abs Immature Granulocytes: 0.01 10*3/uL (ref 0.00–0.07)
BASOS ABS: 0 10*3/uL (ref 0.0–0.1)
Basophils Relative: 0 %
Eosinophils Absolute: 0 10*3/uL (ref 0.0–0.5)
Eosinophils Relative: 1 %
HEMATOCRIT: 28.6 % — AB (ref 39.0–52.0)
Hemoglobin: 8.4 g/dL — ABNORMAL LOW (ref 13.0–17.0)
IMMATURE GRANULOCYTES: 0 %
Lymphocytes Relative: 47 %
Lymphs Abs: 1.6 10*3/uL (ref 0.7–4.0)
MCH: 29.2 pg (ref 26.0–34.0)
MCHC: 29.4 g/dL — ABNORMAL LOW (ref 30.0–36.0)
MCV: 99.3 fL (ref 80.0–100.0)
Monocytes Absolute: 0.4 10*3/uL (ref 0.1–1.0)
Monocytes Relative: 12 %
Neutro Abs: 1.4 10*3/uL — ABNORMAL LOW (ref 1.7–7.7)
Neutrophils Relative %: 40 %
Platelets: 157 10*3/uL (ref 150–400)
RBC: 2.88 MIL/uL — ABNORMAL LOW (ref 4.22–5.81)
RDW: 19 % — ABNORMAL HIGH (ref 11.5–15.5)
WBC: 3.5 10*3/uL — ABNORMAL LOW (ref 4.0–10.5)
nRBC: 0 % (ref 0.0–0.2)

## 2018-08-23 LAB — GLUCOSE, CAPILLARY
Glucose-Capillary: 142 mg/dL — ABNORMAL HIGH (ref 70–99)
Glucose-Capillary: 127 mg/dL — ABNORMAL HIGH (ref 70–99)
Glucose-Capillary: 119 mg/dL — ABNORMAL HIGH (ref 70–99)

## 2018-08-23 LAB — BASIC METABOLIC PANEL
Anion gap: 7 (ref 5–15)
BUN: 8 mg/dL (ref 8–23)
CHLORIDE: 104 mmol/L (ref 98–111)
CO2: 23 mmol/L (ref 22–32)
Calcium: 7.9 mg/dL — ABNORMAL LOW (ref 8.9–10.3)
Creatinine, Ser: 0.46 mg/dL — ABNORMAL LOW (ref 0.61–1.24)
GFR calc Af Amer: >60 mL/min (ref >60)
GFR calc non Af Amer: >60 mL/min (ref >60)
Glucose, Bld: 146 mg/dL — ABNORMAL HIGH (ref 70–99)
Potassium: 4.3 mmol/L (ref 3.5–5.1)
Sodium: 134 mmol/L — ABNORMAL LOW (ref 135–145)

## 2018-08-23 LAB — HEPARIN LEVEL (UNFRACTIONATED)
HEPARIN UNFRACTIONATED: 0.26 [IU]/mL — AB (ref 0.30–0.70)
Heparin Unfractionated: 0.36 IU/mL (ref 0.30–0.70)
Heparin Unfractionated: 0.2 IU/mL — ABNORMAL LOW (ref 0.30–0.70)

## 2018-08-23 LAB — MAGNESIUM: MAGNESIUM: 1.8 mg/dL (ref 1.7–2.4)

## 2018-08-23 LAB — PHOSPHORUS: Phosphorus: 2.6 mg/dL (ref 2.5–4.6)

## 2018-08-23 MED ORDER — DEXTROSE-NACL 5-0.45 % IV SOLN
INTRAVENOUS | Status: DC
  Administered 2018-08-23 – 2018-08-25 (×3): via INTRAVENOUS

## 2018-08-23 MED ORDER — HEPARIN (PORCINE) 25000 UT/250ML-% IV SOLN
1950.0000 [IU]/h | INTRAVENOUS | Status: DC
  Administered 2018-08-23 (×2): 1950 [IU]/h via INTRAVENOUS
  Filled 2018-08-23 (×2): qty 250

## 2018-08-23 MED ORDER — MAGNESIUM SULFATE 2 GM/50ML IV SOLN
2.0000 g | Freq: Once | INTRAVENOUS | Status: AC
  Administered 2018-08-23: 2 g via INTRAVENOUS
  Filled 2018-08-23: qty 50

## 2018-08-23 MED ORDER — TRAVASOL 10 % IV SOLN
INTRAVENOUS | Status: AC
  Administered 2018-08-23: 18:00:00 via INTRAVENOUS
  Filled 2018-08-23: qty 1320

## 2018-08-23 NOTE — Progress Notes (Signed)
Nutrition Follow-up  DOCUMENTATION CODES:   Severe malnutrition in context of chronic illness  INTERVENTION:   Monitor magnesium, potassium, and phosphorus daily for at least 3 days, MD to replete as needed, as pt is at risk for refeeding syndrome given severe malnutrition, poor PO intakes for at least a week now, and low K levels.  -TPN per Pharmacy -Monitor for diet advancement -Once diet advancement, provide Ensure Enlive po BID, each supplement provides 350 kcal and 20 grams of protein  NUTRITION DIAGNOSIS:   Severe Malnutrition related to chronic illness, cancer and cancer related treatments as evidenced by energy intake < or equal to 75% for > or equal to 1 month, severe fat depletion, percent weight loss, mild muscle depletion.  Ongoing.  GOAL:   Patient will meet greater than or equal to 90% of their needs  Will meet with TPN.  MONITOR:   Diet advancement, Weight trends, I & O's(TPN)  ASSESSMENT:   Patient with PMH significant for HLD, DM, and high-grade B-cell lymphoma (on chemotherapy). Recently admitted 11/17 for SBO and treated medically. Presents this admission with recurrent SBO, sigmoid stenosis, and intraabdominal B cell lymphoma.   12/6: Initial nutrition assessment, NGT to suction, bowel rest, NPO 12/9: TPN initiated at 40  Patient is receiving TPN at 70 ml/hr providing 1730 kcal and 92g protein. Pt is tentatively planned for surgery this week. NGT still in place, ~300 ml output. Last BM 12/10. Per Pharmacy note, pt will advance to TPN goal rate of 100 ml/hr providing 2472 kcal and 132g protein today.  Weight is stable as of 12/9.  Medications: D5 -.45% NaCl infusion at 10 ml/hr, IV Mg sulfate once,  Labs reviewed: CBGs: 125-142 Low Na Mg/Phos WNL  Diet Order:   Diet Order            Diet NPO time specified  Diet effective now              EDUCATION NEEDS:   Not appropriate for education at this time  Skin:  Skin Assessment: Reviewed RN  Assessment  Last BM:  12/10  Height:   Ht Readings from Last 1 Encounters:  08/17/18 5' 9"  (1.753 m)    Weight:   Wt Readings from Last 1 Encounters:  08/21/18 90.8 kg    Ideal Body Weight:  72.7 kg  BMI:  Body mass index is 29.56 kg/m.  Estimated Nutritional Needs:   Kcal:  2400-2600 kcal  Protein:  115-130 grams  Fluid:  >/= 2.4 L/day  Clayton Bibles, MS, RD, LDN [facility] Pager: [phone] After Hours Pager: [phone]

## 2018-08-23 NOTE — Progress Notes (Signed)
[place] for heparin Indication: hx pulmonary embolus and DVT (home Eliquis on hold)  Allergies  Allergen Reactions  . Ciprofloxacin Other (See Comments)    Leg tingling    Patient Measurements: Height: 5' 9"  (175.3 cm) Weight: 200 lb 2.8 oz (90.8 kg) IBW/kg (Calculated) : 70.7 Heparin Dosing Weight: 89 kg  Vital Signs: Temp: 98.5 F (36.9 C) (12/11 0515) Temp Source: Oral (12/11 0515) BP: 124/78 (12/11 0515) Pulse Rate: 104 (12/11 0515)  Labs: Recent Labs    08/20/18 1021  08/21/18 0418 08/22/18 0633 08/23/18 0613  HGB  --    < > 8.4* 8.2* 8.4*  HCT  --   --  28.0* 28.3* 28.6*  PLT  --   --  176 194 157  APTT 76*  --  81*  --   --   HEPARINUNFRC  --    < > 0.47 0.34 0.26*  CREATININE  --   --  0.48* 0.53* 0.46*   < > = values in this interval not displayed.    Estimated Creatinine Clearance: 106.6 mL/min (A) (by C-G formula based on SCr of 0.46 mg/dL (L)).   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  Abd CT on 12/5 showed bowel obstruction. Patient was transitioned from Eliquis to heparin drip on admission since patient is NPO and in case surgical intervention is needed.   Baseline INR, aPTT: not done  Prior anticoagulation: Eliquis 5 mg bid, LD 12/4 at 1800  Significant events:  Today, 08/23/2018:  CBC: Hgb low but stable, Plt WNL  HL is 0.26, subtherapeutic.   Scr stable at 0.46 mg/dl  No bleeding or infusion issues per nursing  Goal of Therapy: Heparin level 0.3-0.7 units/ml Monitor platelets by anticoagulation protocol: Yes  Plan:  Increase heparin IV infusion to 1950 units/hr  HL 6 hours after rate change   Daily CBC and heparin level with AM labs   Monitor for signs of bleeding or thrombosis  F/u GI plans as patient may need  surgery in the near future; will need heparin stopped appropriately    Royetta Asal, PharmD, BCPS Pager [phone] 08/23/2018 8:49 AM

## 2018-08-23 NOTE — Progress Notes (Signed)
NG tubed dislodged at 2230 during stand pivot transfer. Patient he did not want new NG tube placed tonight.  Instructed Patient to notify staff if increased abdominal distension/nausea develops.

## 2018-08-23 NOTE — Progress Notes (Signed)
Pharmacy Note:  34 y/oM on IV heparin for hx of VTE while PTA apixaban on hold due to bowel obstruction.    1655 heparin level = 0.36 units/mL, now therapeutic on heparin infusion at 1950 units/hr  CBC: Hgb low/stable at 8.4, Pltc WNL  No bleeding or complications of therapy noted per nursing   Plan:  Continue IV heparin infusion at 1950 units/hr  Check heparin level at 2300 to ensure remains within therapeutic range  Daily CBC and heparin level  Monitor closely for s/sx of bleeding   Lindell Spar, PharmD, BCPS Pager: [phone] 08/23/2018 5:34 PM

## 2018-08-23 NOTE — Progress Notes (Signed)
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   Assessment & Plan:   Principal Problem:   Bowel obstruction in setting of lymphoma and sigmoid stricture Active Problems:   Morbid obesity ([place])   Controlled diabetes mellitus type II without complication (HCC)   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HCC)   Deep vein thrombosis (DVT) of femoral vein of right lower extremity (HCC)   Protein-calorie malnutrition, severe   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HCC)   Abnormal CT of the abdomen   #) Bowel obstruction: There does appear to be stenosis at the level of the sigmoid on the CT scan on admission though there was additionally findings of inflammatory changes adjacent to the distal/terminal ileum as well as extensive scarring and potential fistulous tract.  Currently the patient is put out approximately 400 cc from his NG tube.  He continues to require TPN.  At this time this obstruction does not appear to be malignant but possibly sequelae of his prior high-grade lymphoma. - Continue TPN -General surgery possibly plans for OR on 08/25/2018  #) Right lower extremity DVT: -Hold apixaban 5 mg twice daily -Continue heparin drip goal PTT is 60-90  #) Type diabetes: -Hold sitagliptin and metformin -Sliding scale insulin, AC at bedtime  #) High-grade B-cell lymphoma: This is third to be in remission currently.  Fluids: TPN Electrolytes: TPN Nutrition: TPN  Prophylaxis: Heparin drip  Disposition: Pending OR  Full code    Consultants:   General surgery  Oncology  Procedures:  None  Antimicrobials:    None   Subjective: This morning patient reports he is doing well.  He denies any nausea, vomiting, diarrhea, cough, congestion.  He continues to report pain with the suction.  He does report having some diarrhea.  Objective: Vitals:   08/22/18 1417 08/22/18 1958 08/23/18 0515 08/23/18 1000  BP: 116/73 122/76 124/78   Pulse: 95 (!) 102 (!) 104 98  Resp: 18 18 18    Temp: 98.1 F (36.7 C) 98.4 F (36.9 C) 98.5 F (36.9 C)   TempSrc:  Oral Oral   SpO2: 97% 96% 97%   Weight:      Height:        Intake/Output Summary (Last 24 hours) at 08/23/2018 1258 Last data filed at 08/23/2018 0600 Gross per 24 hour  Intake 1692.83 ml  Output 300 ml  Net 1392.83 ml   Filed Weights   08/17/18 1555 08/19/18 0700 08/21/18 1140  Weight: 90.3 kg 90.1 kg 90.8 kg    Examination:  General exam: Appears calm and comfortable  Respiratory system: Clear to auscultation. Respiratory effort normal. Cardiovascular system: Regular rate and rhythm, no murmurs Gastrointestinal system: Soft, mild epigastric tenderness, no rebound or guarding, hyperactive bowel sounds Central nervous system: Alert and oriented.  Grossly intact, moving all extremities Extremities: Trace lower extremity edema. Skin: Port site is clean dry and intact Psychiatry: Judgement and insight appear normal. Mood & affect appropriate.     Data Reviewed: I have personally reviewed following labs and imaging studies  CBC: Recent Labs  Lab 08/19/18 0607 08/20/18 0417 08/21/18 0418 08/22/18 0633 08/23/18 0613  WBC 5.8 5.0 5.0 4.7 3.5*  NEUTROABS 3.4 3.2 2.7 2.2 1.4*  HGB 9.3* 8.5* 8.4* 8.2* 8.4*  HCT 32.0* 28.9* 28.0* 28.3* 28.6*  MCV 101.6* 101.4* 101.4* 102.9* 99.3  PLT 213 224 176 194 518   Basic Metabolic Panel: Recent Labs  Lab 08/19/18 0607 08/20/18 0417 08/21/18 0418 08/22/18 0633 08/23/18 0613  NA 140 138 137 137 134*  K 3.8 3.4* 3.2* 3.4* 4.3  CL 102 103 103 103 104  CO2 26 27 27 27 23   GLUCOSE 89  113* 121* 148* 146*  BUN 12 11 10 9 8   CREATININE 0.69 0.58* 0.48* 0.53* 0.46*  CALCIUM 8.5* 8.0* 7.9* 8.0* 7.9*  MG  --   --   --  1.8 1.8  PHOS  --   --   --  3.0 2.6   GFR: Estimated Creatinine Clearance: 106.6 mL/min (A) (by C-G formula based on SCr of 0.46 mg/dL (L)). Liver Function Tests: Recent Labs  Lab 08/17/18 1738 08/18/18 0510 08/22/18 0633  AST 13* 11* 8*  ALT 13 10 8   ALKPHOS 56 47 39  BILITOT 0.5 0.4 0.4  PROT 5.7* 5.0* 4.5*  ALBUMIN 2.7* 2.7* 2.2*   Recent Labs  Lab 08/17/18 1738  LIPASE 19   No results for input(s): AMMONIA in the last 168 hours. Coagulation Profile: No results for input(s): INR, PROTIME in the last 168 hours. Cardiac Enzymes: No results for input(s): CKTOTAL, CKMB, CKMBINDEX, TROPONINI in the last 168 hours. BNP (last 3 results) No results for input(s): PROBNP in the last 8760 hours. HbA1C: No results for input(s): HGBA1C in the last 72 hours. CBG: Recent Labs  Lab 08/22/18 0359 08/22/18 0758 08/22/18 1138 08/22/18 2055 08/23/18 0216  GLUCAP 141* 140* 113* 125* 142*   Lipid Profile: Recent Labs    08/22/18 0633  TRIG 110   Thyroid Function Tests: No results for input(s): TSH, T4TOTAL, FREET4, T3FREE, THYROIDAB in the last 72 hours. Anemia Panel: No results for input(s): VITAMINB12, FOLATE, FERRITIN, TIBC, IRON, RETICCTPCT in the last 72 hours. Sepsis Labs: No results for input(s): PROCALCITON, LATICACIDVEN in the last 168 hours.  No results found for this or any previous visit (from the past 240 hour(s)).       Radiology Studies: Ct Entero Conni Slipper Contast  Result Date: 08/23/2018 CLINICAL DATA:  62 year old male with history of potential low-grade bowel obstruction. Follow-up study. EXAM: CT ABDOMEN AND PELVIS WITH CONTRAST (ENTEROGRAPHY) TECHNIQUE: Multidetector CT of the abdomen and pelvis during bolus administration of intravenous contrast. Negative oral contrast was given. CONTRAST:  144m ISOVUE-300  IOPAMIDOL (ISOVUE-300) INJECTION 61% COMPARISON:  CT the abdomen and pelvis 08/17/2018. FINDINGS: Lower chest: Areas of scarring are noted throughout the lung bases bilaterally. Nasogastric tube in the distal esophagus. Central venous catheter tip extending into the superior aspect of the right atrium. Hepatobiliary: There are 2 subcentimeter low-attenuation lesions in the right lobe of the liver, too small to characterize, but statistically likely to represent tiny cysts. No other larger more suspicious appearing cystic or solid hepatic lesions. No intra or extrahepatic biliary ductal dilatation. Gallbladder is unremarkable in appearance. Pancreas: No pancreatic mass. No pancreatic ductal dilatation. No pancreatic or peripancreatic fluid or inflammatory changes. Spleen: Unremarkable. Adrenals/Urinary Tract: 2 nonobstructive calculi are noted within the right renal collecting system, largest of which is in the lower pole measuring 6 mm. In addition, in the distal third of the right ureter immediately before the right ureterovesicular junction there is a 3 mm calculus (axial image 76 of series 3). This  is not associated with significant proximal hydroureteronephrosis to indicate urinary tract obstruction at this time. Several subcentimeter low-attenuation lesions in both kidneys are too small to definitively characterize, but are favored to represent tiny cysts. Bilateral adrenal glands are normal in appearance. Urinary bladder is normal in appearance. Stomach/Bowel: Tip of nasogastric tube terminates in the proximal stomach. As noted on the prior examination, there continues to be some mass-like mural thickening in the sigmoid colon estimated to measure approximately 8.7 x 4.7 x 4.4 cm (axial image 66 of series 3 and coronal image 44 of series 7), where there is also associated marked luminal narrowing. Proximal to this, there is moderate distension of the colon with several air-fluid levels. Multiple prominent  borderline dilated loops of small bowel are noted measuring up to 2.8 cm in diameter, with several small air-fluid levels. These findings suggest very mild partial obstruction of the distal colon. In addition, there continues to be severe thickening of the wall of the terminal ileum with surrounding inflammatory changes. Notably, there is some unusual architectural distortion and soft tissue thickening extending from the anterior aspect of the distal ileum toward the anterior abdominal wall best appreciated on axial image 65 of series 3, potentially a developing fistula (this does not appear to extend beyond the peritoneal cavity at this time). In addition, the appendix remains dilated up to 20 mm in diameter, contain several appendicoliths, and continues to have some surrounding inflammatory changes in the periappendiceal fat, however, this appearance is relatively stable compared to the recent prior examinations. Inflammatory changes are also noted in the sigmoid mesocolon, and there is a small amount of interloop fluid in the right lower quadrant. Vascular/Lymphatic: Aortic atherosclerosis, without evidence of aneurysm or dissection in the abdominal or pelvic vasculature. No lymphadenopathy noted in the abdomen or pelvis. Reproductive: Prostate gland and seminal vesicles are unremarkable in appearance. Other: Trace volume.  Of ascites.  No pneumoperitoneum Musculoskeletal: There are no aggressive appearing lytic or blastic lesions noted in the visualized portions of the skeleton. IMPRESSION: 1. Persistent mass-like mural thickening of the sigmoid colon which appears to cause mild partial obstruction. 2. Inflammatory changes throughout the soft tissues of the low anatomic pelvis involving the sigmoid mesocolon, the mesenteric fat adjacent to the distal/terminal ileum (which also demonstrates mural thickening), the as well as the periappendiceal fat adjacent to the appendix which remains dilated, thickened and  inflamed with internal appendicoliths. Although these findings would typically be indicative of an acute process, the appearance is relatively stable compared to recent prior examinations, suggesting that some of this is post treatment related. There does appear to be extensive scarring in this region, including a potential fistulous tract developing from the anterior aspect of the terminal ileum (discussed above), which remains confined to the peritoneal cavity at this time. 3. Nonobstructive calculi in the collecting system of the right kidney measuring up to 6 mm in the lower pole. In addition, there is a 3 mm calculus in the distal third of the right ureter immediately before the right ureterovesicular junction. No proximal hydroureteronephrosis to indicate associated urinary tract obstruction at this time. 4. Aortic atherosclerosis. 5. Additional incidental findings, as above. Electronically Signed   By: Vinnie Langton M.D.   On: 08/23/2018 08:16        Scheduled Meds: . insulin aspart  0-9 Units Subcutaneous Q6H   Continuous Infusions: . dextrose 5 % and 0.45% NaCl 30 mL/hr at 08/23/18 0300  . dextrose 5 % and 0.45% NaCl    .  famotidine (PEPCID) IV 20 mg (08/23/18 1010)  . heparin 1,950 Units/hr (08/23/18 1258)  . magnesium sulfate 1 - 4 g bolus IVPB    . TPN ADULT (ION) 70 mL/hr at 08/22/18 1934  . TPN ADULT (ION)       LOS: 6 days    Time spent: [place], MD [facility]  If 7PM-7AM, please contact night-coverage [email or web address] Password [facility] 08/23/2018, 12:58 PM

## 2018-08-23 NOTE — Consult Note (Signed)
[place] Nurse requested for preoperative stoma site marking  Discussed surgical procedure and stoma creation with patient and family. Wife at  bedside Explained role of the [facility] nurse team.  Provided the patient with educational booklet and provided samples of pouching options.  Answered patient and family questions.   Examined patient lying, sitting, and standing in order to place the marking in the patient's visual field, away from any creases or abdominal contour issues and within the rectus muscle.  Patient wears his pants below the umbilicus, just at his hips.  He has a rounded abdomen which would create a potential difficult visualization of the stoma for self care.  I will mark above the umbilicus after input from patient and wife.   Marked for colostomy in the LLQ  5 cm to the left of the umbilicus and 3 cm above the umbilicus.  Marked for ileostomy in the RLQ  5 cm to the right of the umbilicus and 3  cm above the umbilicus.  Patient's abdomen cleansed with CHG wipes at site markings, allowed to air dry prior to marking.Covered mark with thin film transparent dressing to preserve mark until date of surgery.   [place] Nurse team will follow up with patient after surgery for continue ostomy care and teaching.    Domenic Moras MSN, RN, FNP-BC CWON Wound, Ostomy, Continence Nurse Pager [phone]

## 2018-08-23 NOTE — Progress Notes (Signed)
Patient ID: Mark Frey, male   DOB: Jan 21, 1956, 62 y.o.   MRN: [id number]    3 Days Post-Op  Subjective: Patient having multiple BMs.  Walking with his wife.  No new complaints.  Objective: Vital signs in last 24 hours: Temp:  [98.1 F (36.7 C)-98.5 F (36.9 C)] 98.5 F (36.9 C) (12/11 0515) Pulse Rate:  [95-104] 104 (12/11 0515) Resp:  [18] 18 (12/11 0515) BP: (116-124)/(73-78) 124/78 (12/11 0515) SpO2:  [96 %-97 %] 97 % (12/11 0515) Last BM Date: 08/13/18  Intake/Output from previous day: 12/10 0701 - 12/11 0700 In: 1692.8 [I.V.:1592.8; IV Piggyback:100] Out: 300 [Emesis/NG output:300] Intake/Output this shift: No intake/output data recorded.  PE: Heart: regular Lungs: CTAB Abd: soft, NT, ND, +BS, NGT with only about 300cc yesterday.  Lab Results:  Recent Labs    08/22/18 0633 08/23/18 0613  WBC 4.7 3.5*  HGB 8.2* 8.4*  HCT 28.3* 28.6*  PLT 194 157   BMET Recent Labs    08/22/18 0633 08/23/18 0613  NA 137 134*  K 3.4* 4.3  CL 103 104  CO2 27 23  GLUCOSE 148* 146*  BUN 9 8  CREATININE 0.53* 0.46*  CALCIUM 8.0* 7.9*   PT/INR No results for input(s): LABPROT, INR in the last 72 hours. CMP     Component Value Date/Time   NA 134 (L) 08/23/2018 0613   K 4.3 08/23/2018 0613   CL 104 08/23/2018 0613   CO2 23 08/23/2018 0613   GLUCOSE 146 (H) 08/23/2018 0613   BUN 8 08/23/2018 0613   CREATININE 0.46 (L) 08/23/2018 0613   CREATININE 0.60 (L) 08/11/2018 1210   CREATININE 1.26 12/01/2012 1700   CALCIUM 7.9 (L) 08/23/2018 0613   PROT 4.5 (L) 08/22/2018 0633   ALBUMIN 2.2 (L) 08/22/2018 0633   AST 8 (L) 08/22/2018 0633   AST 20 08/11/2018 1210   ALT 8 08/22/2018 0633   ALT 16 08/11/2018 1210   ALKPHOS 39 08/22/2018 0633   BILITOT 0.4 08/22/2018 0633   BILITOT 0.4 08/11/2018 1210   GFRNONAA >60 08/23/2018 0613   GFRNONAA >60 08/11/2018 1210   GFRAA >60 08/23/2018 0613   GFRAA >60 08/11/2018 1210   Lipase     Component Value Date/Time   LIPASE 19 08/17/2018 1738       Studies/Results: Ct Entero Abd/pelvis W Contast  Result Date: 08/23/2018 CLINICAL DATA:  62 year old male with history of potential low-grade bowel obstruction. Follow-up study. EXAM: CT ABDOMEN AND PELVIS WITH CONTRAST (ENTEROGRAPHY) TECHNIQUE: Multidetector CT of the abdomen and pelvis during bolus administration of intravenous contrast. Negative oral contrast was given. CONTRAST:  183m ISOVUE-300 IOPAMIDOL (ISOVUE-300) INJECTION 61% COMPARISON:  CT the abdomen and pelvis 08/17/2018. FINDINGS: Lower chest: Areas of scarring are noted throughout the lung bases bilaterally. Nasogastric tube in the distal esophagus. Central venous catheter tip extending into the superior aspect of the right atrium. Hepatobiliary: There are 2 subcentimeter low-attenuation lesions in the right lobe of the liver, too small to characterize, but statistically likely to represent tiny cysts. No other larger more suspicious appearing cystic or solid hepatic lesions. No intra or extrahepatic biliary ductal dilatation. Gallbladder is unremarkable in appearance. Pancreas: No pancreatic mass. No pancreatic ductal dilatation. No pancreatic or peripancreatic fluid or inflammatory changes. Spleen: Unremarkable. Adrenals/Urinary Tract: 2 nonobstructive calculi are noted within the right renal collecting system, largest of which is in the lower pole measuring 6 mm. In addition, in the distal third of the right ureter immediately before the right  ureterovesicular junction there is a 3 mm calculus (axial image 76 of series 3). This is not associated with significant proximal hydroureteronephrosis to indicate urinary tract obstruction at this time. Several subcentimeter low-attenuation lesions in both kidneys are too small to definitively characterize, but are favored to represent tiny cysts. Bilateral adrenal glands are normal in appearance. Urinary bladder is normal in appearance. Stomach/Bowel: Tip of  nasogastric tube terminates in the proximal stomach. As noted on the prior examination, there continues to be some mass-like mural thickening in the sigmoid colon estimated to measure approximately 8.7 x 4.7 x 4.4 cm (axial image 66 of series 3 and coronal image 44 of series 7), where there is also associated marked luminal narrowing. Proximal to this, there is moderate distension of the colon with several air-fluid levels. Multiple prominent borderline dilated loops of small bowel are noted measuring up to 2.8 cm in diameter, with several small air-fluid levels. These findings suggest very mild partial obstruction of the distal colon. In addition, there continues to be severe thickening of the wall of the terminal ileum with surrounding inflammatory changes. Notably, there is some unusual architectural distortion and soft tissue thickening extending from the anterior aspect of the distal ileum toward the anterior abdominal wall best appreciated on axial image 65 of series 3, potentially a developing fistula (this does not appear to extend beyond the peritoneal cavity at this time). In addition, the appendix remains dilated up to 20 mm in diameter, contain several appendicoliths, and continues to have some surrounding inflammatory changes in the periappendiceal fat, however, this appearance is relatively stable compared to the recent prior examinations. Inflammatory changes are also noted in the sigmoid mesocolon, and there is a small amount of interloop fluid in the right lower quadrant. Vascular/Lymphatic: Aortic atherosclerosis, without evidence of aneurysm or dissection in the abdominal or pelvic vasculature. No lymphadenopathy noted in the abdomen or pelvis. Reproductive: Prostate gland and seminal vesicles are unremarkable in appearance. Other: Trace volume.  Of ascites.  No pneumoperitoneum Musculoskeletal: There are no aggressive appearing lytic or blastic lesions noted in the visualized portions of the  skeleton. IMPRESSION: 1. Persistent mass-like mural thickening of the sigmoid colon which appears to cause mild partial obstruction. 2. Inflammatory changes throughout the soft tissues of the low anatomic pelvis involving the sigmoid mesocolon, the mesenteric fat adjacent to the distal/terminal ileum (which also demonstrates mural thickening), the as well as the periappendiceal fat adjacent to the appendix which remains dilated, thickened and inflamed with internal appendicoliths. Although these findings would typically be indicative of an acute process, the appearance is relatively stable compared to recent prior examinations, suggesting that some of this is post treatment related. There does appear to be extensive scarring in this region, including a potential fistulous tract developing from the anterior aspect of the terminal ileum (discussed above), which remains confined to the peritoneal cavity at this time. 3. Nonobstructive calculi in the collecting system of the right kidney measuring up to 6 mm in the lower pole. In addition, there is a 3 mm calculus in the distal third of the right ureter immediately before the right ureterovesicular junction. No proximal hydroureteronephrosis to indicate associated urinary tract obstruction at this time. 4. Aortic atherosclerosis. 5. Additional incidental findings, as above. Electronically Signed   By: Vinnie Langton M.D.   On: 08/23/2018 08:16    Anti-infectives: Anti-infectives (From admission, onward)   Start     Dose/Rate Route Frequency Ordered Stop   08/18/18 1000  piperacillin-tazobactam (ZOSYN) IVPB  3.375 g  Status:  Discontinued     3.375 g 12.5 mL/hr over 240 Minutes Intravenous Every 8 hours 08/18/18 0858 08/22/18 1255   08/17/18 2300  piperacillin-tazobactam (ZOSYN) IVPB 3.375 g     3.375 g 100 mL/hr over 30 Minutes Intravenous  Once 08/17/18 2254 08/18/18 0203       Assessment/Plan Intraabdominal B cell lymphoma EPOCH chemotherapy - Dr.  [name] Recurrent bowel obstruction x 3,with sigmoid segmental thickening that has developed into stenosis,&dilated appendix; lymphoma VS diverticulitis VS appendicitis. 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  Recurrent small bowel obstruction/sigmoid stenosis -CT enterography reviewed with Dr. Redmond Pulling as well as radiologist, Dr. Weber Cooks.  Please see report for full details.  It is clear the sigmoid colon is the area of concern for his obstruction; however, the TI is thickened with a significant amount of inflammatory changes on the right side and throughout his pelvis.  There is also a concern for a fistulous tract forming from TI towards the dome of the bladder/anterior abdominal wall.  This is certainly concerning and means that he would likely need proximal diversion to help heal this, certainly before it continues to form and cause more problems.  He currently denies any urinary issues and initial UA on 12/5 was normal.  The patient will likely need a diverting loop ileostomy, but still "game time" decision will have to be made based off of operative findings on Friday. -discussed possibly [place] NGT for the next couple of days since his output is decreased and he is having numerous BMs, but patient doesn't want to risk having to have it replaced prior to surgery and therefore wishes for it to remain in place. -will have [place] mark patient for a colostomy and ileostomy. -this was all discussed in extensive detail with the patient and the wife who all understand and all questions were answered to their satisfaction.   Gastritis -some old blood noted in NGT output.  On heparin gtt -will add pepcid 20 mg BID to help with this while NGT in place. -this seems improved today, hgn stable  FEN -IVFs/TNA/NGT VTE -heparin gtt ID -none currently.    LOS: 6 days    Henreitta Cea , [facility] Surgery 08/23/2018, 9:56 AM Pager: [phone]

## 2018-08-23 NOTE — Progress Notes (Signed)
PHARMACY - ADULT TOTAL PARENTERAL NUTRITION CONSULT NOTE   Pharmacy Consult for TPN  Indication: bowel obstruction  Patient Measurements: Height: 5' 9"  (175.3 cm) Weight: 200 lb 2.8 oz (90.8 kg) IBW/kg (Calculated) : 70.7 TPN AdjBW (KG): 75.6 Body mass index is 29.56 kg/m. Usual Weight:     Insulin Requirements: on sensitive SSI q4h, 0 unit in last 24 hours   Current Nutrition: NPO   IVF: D5-NS at 30 ml/hr   Central access:  Port placed 03/27/18  TPN start date: 08/21/2018   ASSESSMENT                                                                                                          HPI: Pharmacy is consulted to start TPN on 62 yo male with a diagnosis of SBO. Pt will need a surgical intervention and needs to improve nutritional status prior to any procedure as prealbumin is 6.   Significant events:   12/9 Start TPN  Today:   Glucose ( goal <150)  - WNL   Electrolytes - K+ 4.3 today WNL , Calcium is 7.9, but Corrected Calcium is 9, low end of normal, magnesium 1.8 low end of normal other labs WNL and stable   Renal - SCr stable, WNL  LFTs - WNL   ( 12/10)   TGs - 110 ( 12/10)    Prealbumin - <5 ( 12/10)  NUTRITIONAL GOALS                                                                                             RD recs: ( 12/9)  Estimated Nutritional Needs:   Kcal:  2400-2600 kcal  Protein:  115-130 grams  Fluid:  >/= 2.4 L/day   Magnesium sulfate 2 gr IV x1   PLAN                                                                                                                         At 1800 today:  Increase TPN to 100 ml/hr, the target rate   Maintain at tolerated goal rate of 100 ml/hr  Target goal rate TPN will provide 2472 Kcal, 132 grams  of protein, 360 grams of dextrose and 72 grams of lipids.   Increased , magnesium, phosphate  and calcium in bag by small amounts   TPN to contain standard multivitamins and trace elements.  Reduce IVF  to [facility] ml/hr at 1800  Maintain SSI at q6h. If BG remain stable at target rate, may consider changing to q8h CBGs  TPN lab panels on Mondays & Thursdays.  F/u daily.   Royetta Asal, PharmD, BCPS Pager [phone] 08/23/2018 10:49 AM

## 2018-08-24 ENCOUNTER — Inpatient Hospital Stay (HOSPITAL_COMMUNITY): Payer: BLUE CROSS/BLUE SHIELD

## 2018-08-24 LAB — COMPREHENSIVE METABOLIC PANEL
ALT: 6 U/L (ref 0–44)
AST: 9 U/L — ABNORMAL LOW (ref 15–41)
Albumin: 2.2 g/dL — ABNORMAL LOW (ref 3.5–5.0)
Alkaline Phosphatase: 37 U/L — ABNORMAL LOW (ref 38–126)
Anion gap: 5 (ref 5–15)
BUN: 9 mg/dL (ref 8–23)
CO2: 25 mmol/L (ref 22–32)
CREATININE: 0.48 mg/dL — AB (ref 0.61–1.24)
Calcium: 8.4 mg/dL — ABNORMAL LOW (ref 8.9–10.3)
Chloride: 106 mmol/L (ref 98–111)
GFR calc Af Amer: >60 mL/min (ref >60)
GFR calc non Af Amer: >60 mL/min (ref >60)
Glucose, Bld: 121 mg/dL — ABNORMAL HIGH (ref 70–99)
Potassium: 5 mmol/L (ref 3.5–5.1)
Sodium: 136 mmol/L (ref 135–145)
Total Bilirubin: 0.6 mg/dL (ref 0.3–1.2)
Total Protein: 4.9 g/dL — ABNORMAL LOW (ref 6.5–8.1)

## 2018-08-24 LAB — CBC
HCT: 27.5 % — ABNORMAL LOW (ref 39.0–52.0)
Hemoglobin: 8.1 g/dL — ABNORMAL LOW (ref 13.0–17.0)
MCH: 30 pg (ref 26.0–34.0)
MCHC: 29.5 g/dL — ABNORMAL LOW (ref 30.0–36.0)
MCV: 101.9 fL — ABNORMAL HIGH (ref 80.0–100.0)
Platelets: 129 10*3/uL — ABNORMAL LOW (ref 150–400)
RBC: 2.7 MIL/uL — ABNORMAL LOW (ref 4.22–5.81)
RDW: 19.3 % — ABNORMAL HIGH (ref 11.5–15.5)
WBC: 4.6 10*3/uL (ref 4.0–10.5)
nRBC: 0 % (ref 0.0–0.2)

## 2018-08-24 LAB — TYPE AND SCREEN
ABO/RH(D): O POS
Antibody Screen: NEGATIVE

## 2018-08-24 LAB — GLUCOSE, CAPILLARY
Glucose-Capillary: 106 mg/dL — ABNORMAL HIGH (ref 70–99)
Glucose-Capillary: 114 mg/dL — ABNORMAL HIGH (ref 70–99)
Glucose-Capillary: 123 mg/dL — ABNORMAL HIGH (ref 70–99)
Glucose-Capillary: 127 mg/dL — ABNORMAL HIGH (ref 70–99)

## 2018-08-24 LAB — MAGNESIUM: MAGNESIUM: 2 mg/dL (ref 1.7–2.4)

## 2018-08-24 LAB — HEPARIN LEVEL (UNFRACTIONATED)
Heparin Unfractionated: 0.33 IU/mL (ref 0.30–0.70)
Heparin Unfractionated: 0.32 [IU]/mL (ref 0.30–0.70)

## 2018-08-24 LAB — MRSA PCR SCREENING: MRSA by PCR: NEGATIVE

## 2018-08-24 LAB — PHOSPHORUS: Phosphorus: 4.1 mg/dL (ref 2.5–4.6)

## 2018-08-24 MED ORDER — HEPARIN (PORCINE) 25000 UT/250ML-% IV SOLN
2150.0000 [IU]/h | INTRAVENOUS | Status: AC
  Administered 2018-08-24 – 2018-08-25 (×3): 2150 [IU]/h via INTRAVENOUS
  Filled 2018-08-24 (×2): qty 250

## 2018-08-24 MED ORDER — HEPARIN (PORCINE) 25000 UT/250ML-% IV SOLN
2150.0000 [IU]/h | INTRAVENOUS | Status: DC
  Administered 2018-08-24: 2150 [IU]/h via INTRAVENOUS
  Filled 2018-08-24 (×2): qty 250

## 2018-08-24 MED ORDER — SODIUM CHLORIDE 0.9 % IV SOLN
2.0000 g | INTRAVENOUS | Status: AC
  Administered 2018-08-25: 2 g via INTRAVENOUS
  Filled 2018-08-24: qty 2

## 2018-08-24 MED ORDER — TRAVASOL 10 % IV SOLN
INTRAVENOUS | Status: AC
  Administered 2018-08-24: 17:00:00 via INTRAVENOUS
  Filled 2018-08-24: qty 1320

## 2018-08-24 NOTE — Progress Notes (Signed)
PHARMACY - ADULT TOTAL PARENTERAL NUTRITION CONSULT NOTE   Pharmacy Consult for TPN  Indication: bowel obstruction  Patient Measurements: Height: 5' 9"  (175.3 cm) Weight: (Bed not zeroed,Standing scale not working properly) IBW/kg (Calculated) : 70.7 TPN AdjBW (KG): 75.6 Body mass index is 29.56 kg/m. Usual Weight:     Insulin Requirements: on sensitive SSI q4h, 2 unit in last 24 hours   Current Nutrition: NPO   IVF: D5-NS at [facility] access:  [place] placed 03/27/18  TPN start date: 08/21/2018   ASSESSMENT                                                                                                          HPI: Pharmacy is consulted to start TPN on 62 yo male with a diagnosis of SBO. Pt will need a surgical intervention and needs to improve nutritional status prior to any procedure as prealbumin is 6.   Significant events:   12/9 Start TPN  Today:   Glucose ( goal <150)  - WNL, range from 119 to 142    Electrolytes - K+5.0  WNL, but trending up  , Calcium is 8.4, but Corrected Calcium is 9.8, WL,  other labs WNL and stable   Renal - SCr stable, WNL   LFTs - WNL   ( 12/10)   TGs - 110 ( 12/10)    Prealbumin - <5 ( 12/10)  NUTRITIONAL GOALS                                                                                             RD recs: ( 12/9)  Estimated Nutritional Needs:   Kcal:  2400-2600 kcal  Protein:  115-130 grams  Fluid:  >/= 2.4 L/day   PLAN                                                                                                                         At 1800 today:  Continue TPN to 100 ml/hr, the target rate   Maintain at tolerated goal rate of 100 ml/hr  Target goal rate TPN will provide 2472 Kcal, 132 grams of protein, 360 grams of dextrose and  72 grams of lipids.   Adjusted potassium,  phosphate  and calcium in bag   TPN to contain standard multivitamins and trace elements.  Continue IVF to [facility] ml/hr at 1800  Maintain  SSI at q6h. If BG remain stable at target rate, may consider changing to q8h CBGs  BMP, magnesium, phosphate with AM labs   TPN lab panels on Mondays & Thursdays.  F/u daily.   Royetta Asal, PharmD, BCPS Pager [phone] 08/24/2018 9:51 AM

## 2018-08-24 NOTE — Progress Notes (Signed)
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   Assessment & Plan:   Principal Problem:   Bowel obstruction in setting of lymphoma and sigmoid stricture Active Problems:   Morbid obesity ([place])   Controlled diabetes mellitus type II without complication (HCC)   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HCC)   Deep vein thrombosis (DVT) of femoral vein of right lower extremity (HCC)   Protein-calorie malnutrition, severe   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HCC)   Abnormal CT of the abdomen   #) Bowel obstruction: Unclear if this is related to inflammation with resolved lymphoma versus persistent lymphoma in the abdomen. - Continue TPN -General surgery possibly plans for OR on 08/25/2018  #) Right lower extremity DVT: -Hold apixaban 5 mg twice daily -Continue heparin drip goal PTT is 60-90  #) Type diabetes: -Hold sitagliptin and metformin -Sliding scale insulin, AC at bedtime  #) High-grade B-cell lymphoma: Unclear if this is in remission or not per above.  Fluids: TPN Electrolytes: TPN Nutrition: TPN  Prophylaxis: Heparin drip  Disposition: Pending OR  Full code    Consultants:   General surgery  Oncology  Procedures:  None  Antimicrobials:   None   Subjective: This morning patient reports he is doing well.  He denies any nausea, vomiting, diarrhea, cough, congestion, rhinorrhea.  Objective: Vitals:   08/23/18 1000 08/23/18 1426 08/23/18 1947 08/24/18 0520  BP:  121/80 127/74 117/78  Pulse: 98 99 (!) 102 88  Resp:  20 18 16   Temp:  98.1 F (36.7 C) 99 F (37.2 C) 97.8 F (36.6 C)    TempSrc:   Oral Oral  SpO2:  98% 99% 98%  Weight:      Height:        Intake/Output Summary (Last 24 hours) at 08/24/2018 1201 Last data filed at 08/24/2018 1000 Gross per 24 hour  Intake 3746.46 ml  Output 2050 ml  Net 1696.46 ml   Filed Weights   08/19/18 0700 08/21/18 1140  Weight: 90.1 kg 90.8 kg    Examination:  General exam: Appears calm and comfortable  Respiratory system: Clear to auscultation. Respiratory effort normal. Cardiovascular system: Regular rate and rhythm, no murmurs Gastrointestinal system: Soft, mild epigastric tenderness, no rebound or guarding, hyperactive bowel sounds Central nervous system: Alert and oriented.  Grossly intact, moving all extremities Extremities: Trace lower extremity edema. Skin: Port site is clean dry and intact Psychiatry: Judgement and insight appear normal. Mood & affect appropriate.     Data Reviewed: I have personally reviewed following labs and imaging studies  CBC: Recent Labs  Lab 08/19/18 0607 08/20/18 0417 08/21/18 0418 08/22/18 0633 08/23/18 0613 08/24/18 0607  WBC 5.8 5.0 5.0 4.7 3.5* 4.6  NEUTROABS 3.4 3.2 2.7 2.2 1.4*  --   HGB 9.3* 8.5* 8.4* 8.2* 8.4* 8.1*  HCT 32.0* 28.9* 28.0* 28.3* 28.6* 27.5*  MCV 101.6* 101.4* 101.4* 102.9* 99.3 101.9*  PLT 213 224 176 194 157 295*   Basic Metabolic Panel: Recent Labs  Lab 08/20/18 0417 08/21/18 0418 08/22/18 0633 08/23/18 0613 08/24/18 0607  NA 138 137 137 134* 136  K 3.4* 3.2* 3.4* 4.3 5.0  CL 103 103  103 104 106  CO2 27 27 27 23 25   GLUCOSE 113* 121* 148* 146* 121*  BUN 11 10 9 8 9   CREATININE 0.58* 0.48* 0.53* 0.46* 0.48*  CALCIUM 8.0* 7.9* 8.0* 7.9* 8.4*  MG  --   --  1.8 1.8 2.0  PHOS  --   --  3.0 2.6 4.1   GFR: Estimated Creatinine Clearance: 106.6 mL/min (A) (by C-G formula based on SCr of 0.48 mg/dL (L)). Liver Function Tests: Recent Labs  Lab 08/17/18 1738 08/18/18 0510 08/22/18 0633 08/24/18 0607  AST 13* 11* 8* 9*  ALT 13 10 8 6    ALKPHOS 56 47 39 37*  BILITOT 0.5 0.4 0.4 0.6  PROT 5.7* 5.0* 4.5* 4.9*  ALBUMIN 2.7* 2.7* 2.2* 2.2*   Recent Labs  Lab 08/17/18 1738  LIPASE 19   No results for input(s): AMMONIA in the last 168 hours. Coagulation Profile: No results for input(s): INR, PROTIME in the last 168 hours. Cardiac Enzymes: No results for input(s): CKTOTAL, CKMB, CKMBINDEX, TROPONINI in the last 168 hours. BNP (last 3 results) No results for input(s): PROBNP in the last 8760 hours. HbA1C: No results for input(s): HGBA1C in the last 72 hours. CBG: Recent Labs  Lab 08/23/18 0216 08/23/18 1207 08/23/18 1817 08/24/18 0012 08/24/18 0622  GLUCAP 142* 127* 119* 123* 127*   Lipid Profile: Recent Labs    08/22/18 0633  TRIG 110   Thyroid Function Tests: No results for input(s): TSH, T4TOTAL, FREET4, T3FREE, THYROIDAB in the last 72 hours. Anemia Panel: No results for input(s): VITAMINB12, FOLATE, FERRITIN, TIBC, IRON, RETICCTPCT in the last 72 hours. Sepsis Labs: No results for input(s): PROCALCITON, LATICACIDVEN in the last 168 hours.  No results found for this or any previous visit (from the past 240 hour(s)).       Radiology Studies: Ct Entero Conni Slipper Contast  Result Date: 08/23/2018 CLINICAL DATA:  62 year old male with history of potential low-grade bowel obstruction. Follow-up study. EXAM: CT ABDOMEN AND PELVIS WITH CONTRAST (ENTEROGRAPHY) TECHNIQUE: Multidetector CT of the abdomen and pelvis during bolus administration of intravenous contrast. Negative oral contrast was given. CONTRAST:  14m ISOVUE-300 IOPAMIDOL (ISOVUE-300) INJECTION 61% COMPARISON:  CT the abdomen and pelvis 08/17/2018. FINDINGS: Lower chest: Areas of scarring are noted throughout the lung bases bilaterally. Nasogastric tube in the distal esophagus. Central venous catheter tip extending into the superior aspect of the right atrium. Hepatobiliary: There are 2 subcentimeter low-attenuation lesions in the right lobe  of the liver, too small to characterize, but statistically likely to represent tiny cysts. No other larger more suspicious appearing cystic or solid hepatic lesions. No intra or extrahepatic biliary ductal dilatation. Gallbladder is unremarkable in appearance. Pancreas: No pancreatic mass. No pancreatic ductal dilatation. No pancreatic or peripancreatic fluid or inflammatory changes. Spleen: Unremarkable. Adrenals/Urinary Tract: 2 nonobstructive calculi are noted within the right renal collecting system, largest of which is in the lower pole measuring 6 mm. In addition, in the distal third of the right ureter immediately before the right ureterovesicular junction there is a 3 mm calculus (axial image 76 of series 3). This is not associated with significant proximal hydroureteronephrosis to indicate urinary tract obstruction at this time. Several subcentimeter low-attenuation lesions in both kidneys are too small to definitively characterize, but are favored to represent tiny cysts. Bilateral adrenal glands are normal in appearance. Urinary bladder is normal in appearance. Stomach/Bowel: Tip of nasogastric tube terminates in the proximal stomach. As noted on the prior examination, there continues to  be some mass-like mural thickening in the sigmoid colon estimated to measure approximately 8.7 x 4.7 x 4.4 cm (axial image 66 of series 3 and coronal image 44 of series 7), where there is also associated marked luminal narrowing. Proximal to this, there is moderate distension of the colon with several air-fluid levels. Multiple prominent borderline dilated loops of small bowel are noted measuring up to 2.8 cm in diameter, with several small air-fluid levels. These findings suggest very mild partial obstruction of the distal colon. In addition, there continues to be severe thickening of the wall of the terminal ileum with surrounding inflammatory changes. Notably, there is some unusual architectural distortion and soft  tissue thickening extending from the anterior aspect of the distal ileum toward the anterior abdominal wall best appreciated on axial image 65 of series 3, potentially a developing fistula (this does not appear to extend beyond the peritoneal cavity at this time). In addition, the appendix remains dilated up to 20 mm in diameter, contain several appendicoliths, and continues to have some surrounding inflammatory changes in the periappendiceal fat, however, this appearance is relatively stable compared to the recent prior examinations. Inflammatory changes are also noted in the sigmoid mesocolon, and there is a small amount of interloop fluid in the right lower quadrant. Vascular/Lymphatic: Aortic atherosclerosis, without evidence of aneurysm or dissection in the abdominal or pelvic vasculature. No lymphadenopathy noted in the abdomen or pelvis. Reproductive: Prostate gland and seminal vesicles are unremarkable in appearance. Other: Trace volume.  Of ascites.  No pneumoperitoneum Musculoskeletal: There are no aggressive appearing lytic or blastic lesions noted in the visualized portions of the skeleton. IMPRESSION: 1. Persistent mass-like mural thickening of the sigmoid colon which appears to cause mild partial obstruction. 2. Inflammatory changes throughout the soft tissues of the low anatomic pelvis involving the sigmoid mesocolon, the mesenteric fat adjacent to the distal/terminal ileum (which also demonstrates mural thickening), the as well as the periappendiceal fat adjacent to the appendix which remains dilated, thickened and inflamed with internal appendicoliths. Although these findings would typically be indicative of an acute process, the appearance is relatively stable compared to recent prior examinations, suggesting that some of this is post treatment related. There does appear to be extensive scarring in this region, including a potential fistulous tract developing from the anterior aspect of the  terminal ileum (discussed above), which remains confined to the peritoneal cavity at this time. 3. Nonobstructive calculi in the collecting system of the right kidney measuring up to 6 mm in the lower pole. In addition, there is a 3 mm calculus in the distal third of the right ureter immediately before the right ureterovesicular junction. No proximal hydroureteronephrosis to indicate associated urinary tract obstruction at this time. 4. Aortic atherosclerosis. 5. Additional incidental findings, as above. Electronically Signed   By: Vinnie Langton M.D.   On: 08/23/2018 08:16   Dg Chest Port 1 View  Result Date: 08/24/2018 CLINICAL DATA:  Preoperative evaluation EXAM: PORTABLE CHEST 1 VIEW COMPARISON:  None. FINDINGS: Cardiac shadows within normal limits. Right chest wall port is noted in satisfactory position. No acute infiltrate or effusion is seen. No bony abnormality is noted. IMPRESSION: No acute abnormality seen. Electronically Signed   By: Inez Catalina M.D.   On: 08/24/2018 09:24        Scheduled Meds: . insulin aspart  0-9 Units Subcutaneous Q6H   Continuous Infusions: . [START ON 08/25/2018] cefoTEtan (CEFOTAN) IV    . dextrose 5 % and 0.45% NaCl Stopped (08/24/18  8280)  . famotidine (PEPCID) IV 100 mL/hr at 08/24/18 1000  . heparin 2,150 Units/hr (08/24/18 1000)  . TPN ADULT (ION) 100 mL/hr at 08/24/18 1000  . TPN ADULT (ION)       LOS: 7 days    Time spent: [place], MD [facility]  If 7PM-7AM, please contact night-coverage [email or web address] Password [email or web address] 08/24/2018, 12:01 PM

## 2018-08-24 NOTE — Progress Notes (Signed)
Patient ID: Mark Frey, male   DOB: [birth year], 62 y.o.   MRN: [id number]    4 Days Post-Op  Subjective: Patient feels well today.  NGT came out overnight while trying to get up to use the bathroom.  No nausea currently.  Ready for surgery tomorrow.  Marked yesterday by WOC.  Objective: Vital signs in last 24 hours: Temp:  [97.8 F (36.6 C)-99 F (37.2 C)] 97.8 F (36.6 C) (12/12 0520) Pulse Rate:  [88-102] 88 (12/12 0520) Resp:  [16-20] 16 (12/12 0520) BP: (117-127)/(74-80) 117/78 (12/12 0520) SpO2:  [98 %-99 %] 98 % (12/12 0520) Last BM Date: (P) 08/23/18  Intake/Output from previous day: 12/11 0701 - 12/12 0700 In: 3230.8 [I.V.:2663.1; IV Piggyback:567.7] Out: 2050 [Urine:1900; Emesis/NG output:150] Intake/Output this shift: No intake/output data recorded.  PE: Heart: regular Lungs: CTAB Abd: soft, NT, ND, +BS  Lab Results:  Recent Labs    08/23/18 0613 08/24/18 0607  WBC 3.5* 4.6  HGB 8.4* 8.1*  HCT 28.6* 27.5*  PLT 157 129*   BMET Recent Labs    08/23/18 0613 08/24/18 0607  NA 134* 136  K 4.3 5.0  CL 104 106  CO2 23 25  GLUCOSE 146* 121*  BUN 8 9  CREATININE 0.46* 0.48*  CALCIUM 7.9* 8.4*   PT/INR No results for input(s): LABPROT, INR in the last 72 hours. CMP     Component Value Date/Time   NA 136 08/24/2018 0607   K 5.0 08/24/2018 0607   CL 106 08/24/2018 0607   CO2 25 08/24/2018 0607   GLUCOSE 121 (H) 08/24/2018 0607   BUN 9 08/24/2018 0607   CREATININE 0.48 (L) 08/24/2018 0607   CREATININE 0.60 (L) 08/11/2018 1210   CREATININE 1.26 12/01/2012 1700   CALCIUM 8.4 (L) 08/24/2018 0607   PROT 4.9 (L) 08/24/2018 0607   ALBUMIN 2.2 (L) 08/24/2018 0607   AST 9 (L) 08/24/2018 0607   AST 20 08/11/2018 1210   ALT 6 08/24/2018 0607   ALT 16 08/11/2018 1210   ALKPHOS 37 (L) 08/24/2018 0607   BILITOT 0.6 08/24/2018 0607   BILITOT 0.4 08/11/2018 1210   GFRNONAA >60 08/24/2018 0607   GFRNONAA >60 08/11/2018 1210   GFRAA >60 08/24/2018 0607    GFRAA >60 08/11/2018 1210   Lipase     Component Value Date/Time   LIPASE 19 08/17/2018 1738       Studies/Results: Ct Entero Abd/pelvis W Contast  Result Date: 08/23/2018 CLINICAL DATA:  62 year old male with history of potential low-grade bowel obstruction. Follow-up study. EXAM: CT ABDOMEN AND PELVIS WITH CONTRAST (ENTEROGRAPHY) TECHNIQUE: Multidetector CT of the abdomen and pelvis during bolus administration of intravenous contrast. Negative oral contrast was given. CONTRAST:  171m ISOVUE-300 IOPAMIDOL (ISOVUE-300) INJECTION 61% COMPARISON:  CT the abdomen and pelvis 08/17/2018. FINDINGS: Lower chest: Areas of scarring are noted throughout the lung bases bilaterally. Nasogastric tube in the distal esophagus. Central venous catheter tip extending into the superior aspect of the right atrium. Hepatobiliary: There are 2 subcentimeter low-attenuation lesions in the right lobe of the liver, too small to characterize, but statistically likely to represent tiny cysts. No other larger more suspicious appearing cystic or solid hepatic lesions. No intra or extrahepatic biliary ductal dilatation. Gallbladder is unremarkable in appearance. Pancreas: No pancreatic mass. No pancreatic ductal dilatation. No pancreatic or peripancreatic fluid or inflammatory changes. Spleen: Unremarkable. Adrenals/Urinary Tract: 2 nonobstructive calculi are noted within the right renal collecting system, largest of which is in the lower pole measuring  6 mm. In addition, in the distal third of the right ureter immediately before the right ureterovesicular junction there is a 3 mm calculus (axial image 76 of series 3). This is not associated with significant proximal hydroureteronephrosis to indicate urinary tract obstruction at this time. Several subcentimeter low-attenuation lesions in both kidneys are too small to definitively characterize, but are favored to represent tiny cysts. Bilateral adrenal glands are normal in  appearance. Urinary bladder is normal in appearance. Stomach/Bowel: Tip of nasogastric tube terminates in the proximal stomach. As noted on the prior examination, there continues to be some mass-like mural thickening in the sigmoid colon estimated to measure approximately 8.7 x 4.7 x 4.4 cm (axial image 66 of series 3 and coronal image 44 of series 7), where there is also associated marked luminal narrowing. Proximal to this, there is moderate distension of the colon with several air-fluid levels. Multiple prominent borderline dilated loops of small bowel are noted measuring up to 2.8 cm in diameter, with several small air-fluid levels. These findings suggest very mild partial obstruction of the distal colon. In addition, there continues to be severe thickening of the wall of the terminal ileum with surrounding inflammatory changes. Notably, there is some unusual architectural distortion and soft tissue thickening extending from the anterior aspect of the distal ileum toward the anterior abdominal wall best appreciated on axial image 65 of series 3, potentially a developing fistula (this does not appear to extend beyond the peritoneal cavity at this time). In addition, the appendix remains dilated up to 20 mm in diameter, contain several appendicoliths, and continues to have some surrounding inflammatory changes in the periappendiceal fat, however, this appearance is relatively stable compared to the recent prior examinations. Inflammatory changes are also noted in the sigmoid mesocolon, and there is a small amount of interloop fluid in the right lower quadrant. Vascular/Lymphatic: Aortic atherosclerosis, without evidence of aneurysm or dissection in the abdominal or pelvic vasculature. No lymphadenopathy noted in the abdomen or pelvis. Reproductive: Prostate gland and seminal vesicles are unremarkable in appearance. Other: Trace volume.  Of ascites.  No pneumoperitoneum Musculoskeletal: There are no aggressive  appearing lytic or blastic lesions noted in the visualized portions of the skeleton. IMPRESSION: 1. Persistent mass-like mural thickening of the sigmoid colon which appears to cause mild partial obstruction. 2. Inflammatory changes throughout the soft tissues of the low anatomic pelvis involving the sigmoid mesocolon, the mesenteric fat adjacent to the distal/terminal ileum (which also demonstrates mural thickening), the as well as the periappendiceal fat adjacent to the appendix which remains dilated, thickened and inflamed with internal appendicoliths. Although these findings would typically be indicative of an acute process, the appearance is relatively stable compared to recent prior examinations, suggesting that some of this is post treatment related. There does appear to be extensive scarring in this region, including a potential fistulous tract developing from the anterior aspect of the terminal ileum (discussed above), which remains confined to the peritoneal cavity at this time. 3. Nonobstructive calculi in the collecting system of the right kidney measuring up to 6 mm in the lower pole. In addition, there is a 3 mm calculus in the distal third of the right ureter immediately before the right ureterovesicular junction. No proximal hydroureteronephrosis to indicate associated urinary tract obstruction at this time. 4. Aortic atherosclerosis. 5. Additional incidental findings, as above. Electronically Signed   By: Vinnie Langton M.D.   On: 08/23/2018 08:16    Anti-infectives: Anti-infectives (From admission, onward)   Start  Dose/Rate Route Frequency Ordered Stop   08/25/18 0600  cefoTEtan (CEFOTAN) 2 g in sodium chloride 0.9 % 100 mL IVPB     2 g 200 mL/hr over 30 Minutes Intravenous On call to O.R. 08/24/18 0748 08/26/18 0559   08/18/18 1000  piperacillin-tazobactam (ZOSYN) IVPB 3.375 g  Status:  Discontinued     3.375 g 12.5 mL/hr over 240 Minutes Intravenous Every 8 hours 08/18/18 0858  08/22/18 1255   08/17/18 2300  piperacillin-tazobactam (ZOSYN) IVPB 3.375 g     3.375 g 100 mL/hr over 30 Minutes Intravenous  Once 08/17/18 2254 08/18/18 0203       Assessment/Plan Intraabdominal B cell lymphoma EPOCH chemotherapy - [name] Recurrent bowel obstruction x 3,with sigmoid segmental thickening that has developed into stenosis,&dilated appendix; lymphoma VS diverticulitis VS appendicitis. 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  Recurrent small bowel obstruction/sigmoid stenosis -will leave NGT out today unless he develops N/V. -OR tomorrow.  preop CXR today for preop evaluation.  preop EKG from about 3 weeks ago in the chart and no cardiac or pulmonary complaints. -patient was marked yesterday by WOC -T&S today.  hgb around 8.  plts 127.  CBC in am prior to surgery. -stop heparin gtt at 0400am 12/13   Gastritis -some old blood noted in NGT output. On heparin gtt -pepcid 20 mg BID   FEN -IVFs/TNA VTE -heparin gtt, hold 0400am 12/13 for OR ID -none currently.  Cefotetan on call to OR   LOS: 7 days    Henreitta Cea , [facility] 08/24/2018, 8:32 AM Pager: [phone]

## 2018-08-24 NOTE — H&P (View-Only) (Signed)
Patient ID: Mark Frey, male   DOB: April 12, 1956, 62 y.o.   MRN: [id number]    4 Days Post-Op  Subjective: Patient feels well today.  NGT came out overnight while trying to get up to use the bathroom.  No nausea currently.  Ready for surgery tomorrow.  Marked yesterday by WOC.  Objective: Vital signs in last 24 hours: Temp:  [97.8 F (36.6 C)-99 F (37.2 C)] 97.8 F (36.6 C) (12/12 0520) Pulse Rate:  [88-102] 88 (12/12 0520) Resp:  [16-20] 16 (12/12 0520) BP: (117-127)/(74-80) 117/78 (12/12 0520) SpO2:  [98 %-99 %] 98 % (12/12 0520) Last BM Date: (P) 08/23/18  Intake/Output from previous day: 12/11 0701 - 12/12 0700 In: 3230.8 [I.V.:2663.1; IV Piggyback:567.7] Out: 2050 [Urine:1900; Emesis/NG output:150] Intake/Output this shift: No intake/output data recorded.  PE: Heart: regular Lungs: CTAB Abd: soft, NT, ND, +BS  Lab Results:  Recent Labs    08/23/18 0613 08/24/18 0607  WBC 3.5* 4.6  HGB 8.4* 8.1*  HCT 28.6* 27.5*  PLT 157 129*   BMET Recent Labs    08/23/18 0613 08/24/18 0607  NA 134* 136  K 4.3 5.0  CL 104 106  CO2 23 25  GLUCOSE 146* 121*  BUN 8 9  CREATININE 0.46* 0.48*  CALCIUM 7.9* 8.4*   PT/INR No results for input(s): LABPROT, INR in the last 72 hours. CMP     Component Value Date/Time   NA 136 08/24/2018 0607   K 5.0 08/24/2018 0607   CL 106 08/24/2018 0607   CO2 25 08/24/2018 0607   GLUCOSE 121 (H) 08/24/2018 0607   BUN 9 08/24/2018 0607   CREATININE 0.48 (L) 08/24/2018 0607   CREATININE 0.60 (L) 08/11/2018 1210   CREATININE 1.26 12/01/2012 1700   CALCIUM 8.4 (L) 08/24/2018 0607   PROT 4.9 (L) 08/24/2018 0607   ALBUMIN 2.2 (L) 08/24/2018 0607   AST 9 (L) 08/24/2018 0607   AST 20 08/11/2018 1210   ALT 6 08/24/2018 0607   ALT 16 08/11/2018 1210   ALKPHOS 37 (L) 08/24/2018 0607   BILITOT 0.6 08/24/2018 0607   BILITOT 0.4 08/11/2018 1210   GFRNONAA >60 08/24/2018 0607   GFRNONAA >60 08/11/2018 1210   GFRAA >60 08/24/2018 0607    GFRAA >60 08/11/2018 1210   Lipase     Component Value Date/Time   LIPASE 19 08/17/2018 1738       Studies/Results: Ct Entero Abd/pelvis W Contast  Result Date: 08/23/2018 CLINICAL DATA:  62 year old male with history of potential low-grade bowel obstruction. Follow-up study. EXAM: CT ABDOMEN AND PELVIS WITH CONTRAST (ENTEROGRAPHY) TECHNIQUE: Multidetector CT of the abdomen and pelvis during bolus administration of intravenous contrast. Negative oral contrast was given. CONTRAST:  158m ISOVUE-300 IOPAMIDOL (ISOVUE-300) INJECTION 61% COMPARISON:  CT the abdomen and pelvis 08/17/2018. FINDINGS: Lower chest: Areas of scarring are noted throughout the lung bases bilaterally. Nasogastric tube in the distal esophagus. Central venous catheter tip extending into the superior aspect of the right atrium. Hepatobiliary: There are 2 subcentimeter low-attenuation lesions in the right lobe of the liver, too small to characterize, but statistically likely to represent tiny cysts. No other larger more suspicious appearing cystic or solid hepatic lesions. No intra or extrahepatic biliary ductal dilatation. Gallbladder is unremarkable in appearance. Pancreas: No pancreatic mass. No pancreatic ductal dilatation. No pancreatic or peripancreatic fluid or inflammatory changes. Spleen: Unremarkable. Adrenals/Urinary Tract: 2 nonobstructive calculi are noted within the right renal collecting system, largest of which is in the lower pole measuring  6 mm. In addition, in the distal third of the right ureter immediately before the right ureterovesicular junction there is a 3 mm calculus (axial image 76 of series 3). This is not associated with significant proximal hydroureteronephrosis to indicate urinary tract obstruction at this time. Several subcentimeter low-attenuation lesions in both kidneys are too small to definitively characterize, but are favored to represent tiny cysts. Bilateral adrenal glands are normal in  appearance. Urinary bladder is normal in appearance. Stomach/Bowel: Tip of nasogastric tube terminates in the proximal stomach. As noted on the prior examination, there continues to be some mass-like mural thickening in the sigmoid colon estimated to measure approximately 8.7 x 4.7 x 4.4 cm (axial image 66 of series 3 and coronal image 44 of series 7), where there is also associated marked luminal narrowing. Proximal to this, there is moderate distension of the colon with several air-fluid levels. Multiple prominent borderline dilated loops of small bowel are noted measuring up to 2.8 cm in diameter, with several small air-fluid levels. These findings suggest very mild partial obstruction of the distal colon. In addition, there continues to be severe thickening of the wall of the terminal ileum with surrounding inflammatory changes. Notably, there is some unusual architectural distortion and soft tissue thickening extending from the anterior aspect of the distal ileum toward the anterior abdominal wall best appreciated on axial image 65 of series 3, potentially a developing fistula (this does not appear to extend beyond the peritoneal cavity at this time). In addition, the appendix remains dilated up to 20 mm in diameter, contain several appendicoliths, and continues to have some surrounding inflammatory changes in the periappendiceal fat, however, this appearance is relatively stable compared to the recent prior examinations. Inflammatory changes are also noted in the sigmoid mesocolon, and there is a small amount of interloop fluid in the right lower quadrant. Vascular/Lymphatic: Aortic atherosclerosis, without evidence of aneurysm or dissection in the abdominal or pelvic vasculature. No lymphadenopathy noted in the abdomen or pelvis. Reproductive: Prostate gland and seminal vesicles are unremarkable in appearance. Other: Trace volume.  Of ascites.  No pneumoperitoneum Musculoskeletal: There are no aggressive  appearing lytic or blastic lesions noted in the visualized portions of the skeleton. IMPRESSION: 1. Persistent mass-like mural thickening of the sigmoid colon which appears to cause mild partial obstruction. 2. Inflammatory changes throughout the soft tissues of the low anatomic pelvis involving the sigmoid mesocolon, the mesenteric fat adjacent to the distal/terminal ileum (which also demonstrates mural thickening), the as well as the periappendiceal fat adjacent to the appendix which remains dilated, thickened and inflamed with internal appendicoliths. Although these findings would typically be indicative of an acute process, the appearance is relatively stable compared to recent prior examinations, suggesting that some of this is post treatment related. There does appear to be extensive scarring in this region, including a potential fistulous tract developing from the anterior aspect of the terminal ileum (discussed above), which remains confined to the peritoneal cavity at this time. 3. Nonobstructive calculi in the collecting system of the right kidney measuring up to 6 mm in the lower pole. In addition, there is a 3 mm calculus in the distal third of the right ureter immediately before the right ureterovesicular junction. No proximal hydroureteronephrosis to indicate associated urinary tract obstruction at this time. 4. Aortic atherosclerosis. 5. Additional incidental findings, as above. Electronically Signed   By: Vinnie Langton M.D.   On: 08/23/2018 08:16    Anti-infectives: Anti-infectives (From admission, onward)   Start  Dose/Rate Route Frequency Ordered Stop   08/25/18 0600  cefoTEtan (CEFOTAN) 2 g in sodium chloride 0.9 % 100 mL IVPB     2 g 200 mL/hr over 30 Minutes Intravenous On call to O.R. 08/24/18 0748 08/26/18 0559   08/18/18 1000  piperacillin-tazobactam (ZOSYN) IVPB 3.375 g  Status:  Discontinued     3.375 g 12.5 mL/hr over 240 Minutes Intravenous Every 8 hours 08/18/18 0858  08/22/18 1255   08/17/18 2300  piperacillin-tazobactam (ZOSYN) IVPB 3.375 g     3.375 g 100 mL/hr over 30 Minutes Intravenous  Once 08/17/18 2254 08/18/18 0203       Assessment/Plan Intraabdominal B cell lymphoma EPOCH chemotherapy - [name] Recurrent bowel obstruction x 3,with sigmoid segmental thickening that has developed into stenosis,&dilated appendix; lymphoma VS diverticulitis VS appendicitis. 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  Recurrent small bowel obstruction/sigmoid stenosis -will leave NGT out today unless he develops N/V. -OR tomorrow.  preop CXR today for preop evaluation.  preop EKG from about 3 weeks ago in the chart and no cardiac or pulmonary complaints. -patient was marked yesterday by WOC -T&S today.  hgb around 8.  plts 127.  CBC in am prior to surgery. -stop heparin gtt at 0400am 12/13   Gastritis -some old blood noted in NGT output. On heparin gtt -pepcid 20 mg BID   FEN -IVFs/TNA VTE -heparin gtt, hold 0400am 12/13 for OR ID -none currently.  Cefotetan on call to OR   LOS: 7 days    Henreitta Cea , [facility] 08/24/2018, 8:32 AM Pager: [phone]

## 2018-08-24 NOTE — Progress Notes (Signed)
[place] for heparin Indication: hx pulmonary embolus and DVT (home Eliquis on hold)  Allergies  Allergen Reactions  . Ciprofloxacin Other (See Comments)    Leg tingling    Patient Measurements: Height: 5' 9"  (175.3 cm) Weight: (Bed not zeroed,Standing scale not working properly) IBW/kg (Calculated) : 70.7 Heparin Dosing Weight: 89 kg  Vital Signs: Temp: 97.8 F (36.6 C) (12/12 0520) Temp Source: Oral (12/12 0520) BP: 117/78 (12/12 0520) Pulse Rate: 88 (12/12 0520)  Labs: Recent Labs    08/22/18 0633 08/23/18 0613  08/23/18 2247 08/24/18 0607 08/24/18 1152  HGB 8.2* 8.4*  --   --  8.1*  --   HCT 28.3* 28.6*  --   --  27.5*  --   PLT 194 157  --   --  129*  --   HEPARINUNFRC 0.34 0.26*   < > 0.20* 0.33 0.32  CREATININE 0.53* 0.46*  --   --  0.48*  --    < > = values in this interval not displayed.    Estimated Creatinine Clearance: 106.6 mL/min (A) (by C-G formula based on SCr of 0.48 mg/dL (L)).   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  Abd CT on 12/5 showed bowel obstruction. Patient was transitioned from Eliquis to heparin drip on admission since patient is NPO and in case surgical intervention is needed.   Baseline INR, aPTT: not done  Prior anticoagulation: Eliquis 5 mg bid, LD 12/4 at 1800  Significant events:  Today, 08/24/2018:  CBC: Hgb low but stable, Plt WNL  HL is 0.32, therapeutic on heparin 2150 units/hr (rate increased 12/11 evening).   No bleeding or infusion issues noted  Goal of Therapy: Heparin level 0.3-0.7 units/ml Monitor platelets by anticoagulation protocol: Yes  Plan:  Continue heparin IV infusion at 2150 units/hr  Will order one more HL prior to stop of heparin to help guide therapy if heparin continued    Heparin to stop at 0400   Daily CBC while on heparin   Monitor for signs of bleeding or thrombosis    Royetta Asal, PharmD, BCPS Pager [phone] 08/24/2018 1:59 PM

## 2018-08-24 NOTE — Progress Notes (Signed)
[place] for heparin Indication: hx pulmonary embolus and DVT (home Eliquis on hold)  Allergies  Allergen Reactions  . Ciprofloxacin Other (See Comments)    Leg tingling    Patient Measurements: Height: 5' 9"  (175.3 cm) Weight: (Bed not zeroed,Standing scale not working properly) IBW/kg (Calculated) : 70.7 Heparin Dosing Weight: 89 kg  Vital Signs: Temp: 97.8 F (36.6 C) (12/12 0520) Temp Source: Oral (12/12 0520) BP: 117/78 (12/12 0520) Pulse Rate: 88 (12/12 0520)  Labs: Recent Labs    08/22/18 0633 08/23/18 0613 08/23/18 1655 08/23/18 2247 08/24/18 0607  HGB 8.2* 8.4*  --   --  8.1*  HCT 28.3* 28.6*  --   --  27.5*  PLT 194 157  --   --  129*  HEPARINUNFRC 0.34 0.26* 0.36 0.20* 0.33  CREATININE 0.53* 0.46*  --   --   --     Estimated Creatinine Clearance: 106.6 mL/min (A) (by C-G formula based on SCr of 0.46 mg/dL (L)).   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  Abd CT on 12/5 showed bowel obstruction. Patient was transitioned from Eliquis to heparin drip on admission since patient is NPO and in case surgical intervention is needed.   Baseline INR, aPTT: not done  Prior anticoagulation: Eliquis 5 mg bid, LD 12/4 at 1800  Significant events:  Today, 08/24/2018:  CBC: Hgb low but stable, Plt WNL  HL is 0.33, therapeutic on heparin 2150 units/hr (rate increased 12/11 evening).   No bleeding or infusion issues noted  Goal of Therapy: Heparin level 0.3-0.7 units/ml Monitor platelets by anticoagulation protocol: Yes  Plan:  Continue heparin IV infusion at 2150 units/hr  Repeat hepairn level @ 1200 to confirm therapeutic dose  Daily CBC and heparin level with AM labs   Monitor for signs of bleeding or thrombosis  F/u GI plans as patient  may need surgery in the near future; will need heparin stopped appropriately   Leone Haven, PharmD 08/24/2018 6:37 AM

## 2018-08-24 NOTE — Progress Notes (Signed)
Pharmacy Note:  11 y/oM on IV heparin for hx of VTE while PTA apixaban on hold due to bowel obstruction.    22:47 heparin level = 0.2 units/mL, now subtherapeutic on heparin infusion at 1950 units/hr  No bleeding or complications of therapy noted per nursing  RN reports no issues with IV line   Plan:  Increase IV heparin infusion to 2150 units/hr  Check heparin level in 6 hrs after rate increase  Daily CBC and heparin level  Monitor closely for s/sx of bleeding   Leone Haven, PharmD 08/24/2018 00:18

## 2018-08-25 ENCOUNTER — Inpatient Hospital Stay (HOSPITAL_COMMUNITY): Payer: BLUE CROSS/BLUE SHIELD | Admitting: Anesthesiology

## 2018-08-25 ENCOUNTER — Ambulatory Visit (HOSPITAL_COMMUNITY): Payer: BLUE CROSS/BLUE SHIELD

## 2018-08-25 ENCOUNTER — Encounter (HOSPITAL_COMMUNITY): Payer: Self-pay

## 2018-08-25 ENCOUNTER — Encounter (HOSPITAL_COMMUNITY)
Admission: EM | Disposition: A | Discharge status: Home Health | Payer: Self-pay | Source: Home / Self Care | Attending: Internal Medicine

## 2018-08-25 DIAGNOSIS — Z932 Ileostomy status: Secondary | ICD-10-CM

## 2018-08-25 HISTORY — PX: BOWEL RESECTION: SHX1257

## 2018-08-25 HISTORY — PX: LAPAROSCOPY: SHX197

## 2018-08-25 LAB — CBC
HCT: 28.6 % — ABNORMAL LOW (ref 39.0–52.0)
Hemoglobin: 8.6 g/dL — ABNORMAL LOW (ref 13.0–17.0)
MCH: 29.8 pg (ref 26.0–34.0)
MCHC: 30.1 g/dL (ref 30.0–36.0)
MCV: 99 fL (ref 80.0–100.0)
Platelets: 146 10*3/uL — ABNORMAL LOW (ref 150–400)
RBC: 2.89 MIL/uL — ABNORMAL LOW (ref 4.22–5.81)
RDW: 19.3 % — ABNORMAL HIGH (ref 11.5–15.5)
WBC: 4.5 10*3/uL (ref 4.0–10.5)
nRBC: 0 % (ref 0.0–0.2)

## 2018-08-25 LAB — GLUCOSE, CAPILLARY
GLUCOSE-CAPILLARY: 109 mg/dL — AB (ref 70–99)
Glucose-Capillary: 133 mg/dL — ABNORMAL HIGH (ref 70–99)
Glucose-Capillary: 112 mg/dL — ABNORMAL HIGH (ref 70–99)
Glucose-Capillary: 142 mg/dL — ABNORMAL HIGH (ref 70–99)

## 2018-08-25 LAB — BASIC METABOLIC PANEL
Anion gap: 9 (ref 5–15)
BUN: 12 mg/dL (ref 8–23)
CO2: 22 mmol/L (ref 22–32)
Chloride: 104 mmol/L (ref 98–111)
Creatinine, Ser: 0.45 mg/dL — ABNORMAL LOW (ref 0.61–1.24)
GFR calc Af Amer: >60 mL/min (ref >60)
GFR calc non Af Amer: >60 mL/min (ref >60)
Glucose, Bld: 131 mg/dL — ABNORMAL HIGH (ref 70–99)
Potassium: 4.8 mmol/L (ref 3.5–5.1)
Sodium: 135 mmol/L (ref 135–145)

## 2018-08-25 LAB — MAGNESIUM: Magnesium: 1.9 mg/dL (ref 1.7–2.4)

## 2018-08-25 LAB — BASIC METABOLIC PANEL WITH GFR: Calcium: 8.6 mg/dL — ABNORMAL LOW (ref 8.9–10.3)

## 2018-08-25 LAB — PHOSPHORUS: Phosphorus: 4.7 mg/dL — ABNORMAL HIGH (ref 2.5–4.6)

## 2018-08-25 SURGERY — LAPAROSCOPY, DIAGNOSTIC
Anesthesia: General | Site: Abdomen

## 2018-08-25 MED ORDER — ONDANSETRON HCL 4 MG/2ML IJ SOLN
INTRAMUSCULAR | Status: AC
  Filled 2018-08-25: qty 2

## 2018-08-25 MED ORDER — ACETAMINOPHEN 325 MG PO TABS
650.0000 mg | ORAL_TABLET | Freq: Four times a day (QID) | ORAL | Status: DC
  Administered 2018-08-25 – 2018-08-28 (×8): 650 mg via ORAL
  Filled 2018-08-25 (×10): qty 2

## 2018-08-25 MED ORDER — ROCURONIUM BROMIDE 10 MG/ML (PF) SYRINGE
PREFILLED_SYRINGE | INTRAVENOUS | Status: DC | PRN
  Administered 2018-08-25: 10 mg via INTRAVENOUS
  Administered 2018-08-25: 50 mg via INTRAVENOUS

## 2018-08-25 MED ORDER — LIDOCAINE 2% (20 MG/ML) 5 ML SYRINGE
INTRAMUSCULAR | Status: DC | PRN
  Administered 2018-08-25: 100 mg via INTRAVENOUS

## 2018-08-25 MED ORDER — TRAVASOL 10 % IV SOLN
INTRAVENOUS | Status: AC
  Administered 2018-08-25: 18:00:00 via INTRAVENOUS
  Filled 2018-08-25: qty 1320

## 2018-08-25 MED ORDER — SUCCINYLCHOLINE CHLORIDE 200 MG/10ML IV SOSY
PREFILLED_SYRINGE | INTRAVENOUS | Status: DC | PRN
  Administered 2018-08-25: 120 mg via INTRAVENOUS

## 2018-08-25 MED ORDER — PHENYLEPHRINE 40 MCG/ML (10ML) SYRINGE FOR IV PUSH (FOR BLOOD PRESSURE SUPPORT)
PREFILLED_SYRINGE | INTRAVENOUS | Status: DC | PRN
  Administered 2018-08-25: 120 ug via INTRAVENOUS
  Administered 2018-08-25: 80 ug via INTRAVENOUS

## 2018-08-25 MED ORDER — BUPIVACAINE-EPINEPHRINE 0.25% -1:200000 IJ SOLN
INTRAMUSCULAR | Status: DC | PRN
  Administered 2018-08-25: 20 mL

## 2018-08-25 MED ORDER — TRAMADOL HCL 50 MG PO TABS: 50.0000 mg | ORAL_TABLET | Freq: Four times a day (QID) | ORAL | Status: DC | PRN

## 2018-08-25 MED ORDER — ONDANSETRON HCL 4 MG/2ML IJ SOLN
INTRAMUSCULAR | Status: DC | PRN
  Administered 2018-08-25: 4 mg via INTRAVENOUS

## 2018-08-25 MED ORDER — PROPOFOL 10 MG/ML IV BOLUS
INTRAVENOUS | Status: DC | PRN
  Administered 2018-08-25: 150 mg via INTRAVENOUS

## 2018-08-25 MED ORDER — 0.9 % SODIUM CHLORIDE (POUR BTL) OPTIME
TOPICAL | Status: DC | PRN
  Administered 2018-08-25: 2000 mL

## 2018-08-25 MED ORDER — PROPOFOL 10 MG/ML IV BOLUS
INTRAVENOUS | Status: AC
  Filled 2018-08-25: qty 20

## 2018-08-25 MED ORDER — DEXAMETHASONE SODIUM PHOSPHATE 10 MG/ML IJ SOLN
INTRAMUSCULAR | Status: DC | PRN
  Administered 2018-08-25: 10 mg via INTRAVENOUS

## 2018-08-25 MED ORDER — LACTATED RINGERS IV SOLN
INTRAVENOUS | Status: AC
  Administered 2018-08-25: 1000 mL via INTRAVENOUS

## 2018-08-25 MED ORDER — MIDAZOLAM HCL 5 MG/5ML IJ SOLN
INTRAMUSCULAR | Status: DC | PRN
  Administered 2018-08-25: 2 mg via INTRAVENOUS

## 2018-08-25 MED ORDER — LACTATED RINGERS IV SOLN
INTRAVENOUS | Status: DC | PRN
  Administered 2018-08-25: 11:00:00 via INTRAVENOUS

## 2018-08-25 MED ORDER — FENTANYL CITRATE (PF) 100 MCG/2ML IJ SOLN: 25.0000 ug | INTRAMUSCULAR | Status: DC | PRN

## 2018-08-25 MED ORDER — SUGAMMADEX SODIUM 200 MG/2ML IV SOLN
INTRAVENOUS | Status: AC
  Filled 2018-08-25: qty 2

## 2018-08-25 MED ORDER — FENTANYL CITRATE (PF) 100 MCG/2ML IJ SOLN
INTRAMUSCULAR | Status: DC | PRN
  Administered 2018-08-25 (×3): 50 ug via INTRAVENOUS
  Administered 2018-08-25: 100 ug via INTRAVENOUS

## 2018-08-25 MED ORDER — SCOPOLAMINE 1 MG/3DAYS TD PT72
1.0000 | MEDICATED_PATCH | Freq: Once | TRANSDERMAL | Status: DC
  Administered 2018-08-25: 1.5 mg via TRANSDERMAL

## 2018-08-25 MED ORDER — ALBUMIN HUMAN 5 % IV SOLN
INTRAVENOUS | Status: DC | PRN
  Administered 2018-08-25: 11:00:00 via INTRAVENOUS

## 2018-08-25 MED ORDER — MIDAZOLAM HCL 2 MG/2ML IJ SOLN
INTRAMUSCULAR | Status: AC
  Filled 2018-08-25: qty 2

## 2018-08-25 MED ORDER — LACTATED RINGERS IR SOLN
Status: DC | PRN
  Administered 2018-08-25: 1000 mL

## 2018-08-25 MED ORDER — FENTANYL CITRATE (PF) 250 MCG/5ML IJ SOLN
INTRAMUSCULAR | Status: AC
  Filled 2018-08-25: qty 5

## 2018-08-25 MED ORDER — MORPHINE SULFATE (PF) 2 MG/ML IV SOLN
1.0000 mg | INTRAVENOUS | Status: DC | PRN
  Administered 2018-08-25: 2 mg via INTRAVENOUS
  Administered 2018-08-25 (×3): 3 mg via INTRAVENOUS
  Administered 2018-08-25 (×2): 2 mg via INTRAVENOUS
  Administered 2018-08-26 (×4): 3 mg via INTRAVENOUS
  Filled 2018-08-25 (×2): qty 2
  Filled 2018-08-25: qty 1
  Filled 2018-08-25: qty 2
  Filled 2018-08-25 (×2): qty 1
  Filled 2018-08-25 (×2): qty 2
  Filled 2018-08-25 (×2): qty 1
  Filled 2018-08-25: qty 2

## 2018-08-25 MED ORDER — SCOPOLAMINE 1 MG/3DAYS TD PT72
MEDICATED_PATCH | TRANSDERMAL | Status: AC
  Filled 2018-08-25: qty 1

## 2018-08-25 MED ORDER — HEPARIN (PORCINE) 25000 UT/250ML-% IV SOLN
2150.0000 [IU]/h | INTRAVENOUS | Status: DC
  Administered 2018-08-25 – 2018-08-27 (×4): 2150 [IU]/h via INTRAVENOUS
  Filled 2018-08-25 (×5): qty 250

## 2018-08-25 MED ORDER — SUGAMMADEX SODIUM 200 MG/2ML IV SOLN
INTRAVENOUS | Status: DC | PRN
  Administered 2018-08-25: 200 mg via INTRAVENOUS

## 2018-08-25 SURGICAL SUPPLY — 63 items
ADH SKN CLS APL DERMABOND .7 (GAUZE/BANDAGES/DRESSINGS) ×2
APPLIER CLIP 5 13 M/L LIGAMAX5 (MISCELLANEOUS)
APPLIER CLIP ROT 10 11.4 M/L (STAPLE)
APR CLP MED LRG 11.4X10 (STAPLE)
APR CLP MED LRG 5 ANG JAW (MISCELLANEOUS)
BLADE EXTENDED COATED 6.5IN (ELECTRODE) IMPLANT
BLADE SURG SZ10 CARB STEEL (BLADE) IMPLANT
CATH ROBINSON RED A/P 16FR (CATHETERS) ×3 IMPLANT
CELLS DAT CNTRL 66122 CELL SVR (MISCELLANEOUS) IMPLANT
CHLORAPREP W/TINT 26ML (MISCELLANEOUS) ×4 IMPLANT
CLIP APPLIE 5 13 M/L LIGAMAX5 (MISCELLANEOUS) IMPLANT
CLIP APPLIE ROT 10 11.4 M/L (STAPLE) IMPLANT
CLOSURE WOUND 1/2 X4 (GAUZE/BANDAGES/DRESSINGS)
COVER MAYO STAND STRL (DRAPES) IMPLANT
COVER SURGICAL LIGHT HANDLE (MISCELLANEOUS) ×4 IMPLANT
COVER WAND RF STERILE (DRAPES) IMPLANT
DECANTER SPIKE VIAL GLASS SM (MISCELLANEOUS) ×4 IMPLANT
DERMABOND ADVANCED (GAUZE/BANDAGES/DRESSINGS) ×2
DERMABOND ADVANCED .7 DNX12 (GAUZE/BANDAGES/DRESSINGS) ×1 IMPLANT
DRAPE SHEET LG 3/4 BI-LAMINATE (DRAPES) IMPLANT
DRAPE WARM FLUID 44X44 (DRAPE) IMPLANT
ELECT PENCIL ROCKER SW 15FT (MISCELLANEOUS) IMPLANT
ELECT REM PT RETURN 15FT ADLT (MISCELLANEOUS) ×4 IMPLANT
GAUZE SPONGE 4X4 12PLY STRL (GAUZE/BANDAGES/DRESSINGS) ×4 IMPLANT
GLOVE BIOGEL M STRL SZ7.5 (GLOVE) ×4 IMPLANT
GLOVE INDICATOR 8.0 STRL GRN (GLOVE) ×4 IMPLANT
GOWN STRL REUS W/TWL XL LVL3 (GOWN DISPOSABLE) ×16 IMPLANT
HANDLE SUCTION POOLE (INSTRUMENTS) IMPLANT
IRRIG SUCT STRYKERFLOW 2 WTIP (MISCELLANEOUS)
IRRIGATION SUCT STRKRFLW 2 WTP (MISCELLANEOUS) IMPLANT
KIT BASIN OR (CUSTOM PROCEDURE TRAY) ×4 IMPLANT
LEGGING LITHOTOMY PAIR STRL (DRAPES) IMPLANT
RETRACTOR WND ALEXIS 18 MED (MISCELLANEOUS) IMPLANT
RTRCTR WOUND ALEXIS 18CM MED (MISCELLANEOUS)
SCISSORS LAP 5X35 DISP (ENDOMECHANICALS) ×4 IMPLANT
SHEARS HARMONIC ACE PLUS 36CM (ENDOMECHANICALS) IMPLANT
SLEEVE XCEL OPT CAN 5 100 (ENDOMECHANICALS) ×4 IMPLANT
SPONGE LAP 18X18 RF (DISPOSABLE) IMPLANT
STAPLER VISISTAT 35W (STAPLE) IMPLANT
STRIP CLOSURE SKIN 1/2X4 (GAUZE/BANDAGES/DRESSINGS) IMPLANT
SUCTION POOLE HANDLE (INSTRUMENTS)
SUT ETHILON 2 0 PS N (SUTURE) ×6 IMPLANT
SUT MNCRL AB 4-0 PS2 18 (SUTURE) ×3 IMPLANT
SUT PDS AB 1 TP1 96 (SUTURE) IMPLANT
SUT PROLENE 2 0 KS (SUTURE) IMPLANT
SUT PROLENE 2 0 SH DA (SUTURE) IMPLANT
SUT SILK 2 0 (SUTURE)
SUT SILK 2 0 SH CR/8 (SUTURE) IMPLANT
SUT SILK 2-0 18XBRD TIE 12 (SUTURE) IMPLANT
SUT SILK 3 0 (SUTURE)
SUT SILK 3 0 SH CR/8 (SUTURE) IMPLANT
SUT SILK 3-0 18XBRD TIE 12 (SUTURE) IMPLANT
SYR BULB IRRIGATION 50ML (SYRINGE) IMPLANT
SYS LAPSCP GELPORT 120MM (MISCELLANEOUS)
SYSTEM LAPSCP GELPORT 120MM (MISCELLANEOUS) IMPLANT
TOWEL OR 17X26 10 PK STRL BLUE (TOWEL DISPOSABLE) ×4 IMPLANT
TOWEL OR NON WOVEN STRL DISP B (DISPOSABLE) ×4 IMPLANT
TRAY FOLEY MTR SLVR 16FR STAT (SET/KITS/TRAYS/PACK) ×4 IMPLANT
TRAY LAPAROSCOPIC (CUSTOM PROCEDURE TRAY) ×4 IMPLANT
TROCAR BLADELESS OPT 5 100 (ENDOMECHANICALS) ×4 IMPLANT
TROCAR XCEL NON-BLD 11X100MML (ENDOMECHANICALS) IMPLANT
YANKAUER SUCT BULB TIP 10FT TU (MISCELLANEOUS) IMPLANT
YANKAUER SUCT BULB TIP NO VENT (SUCTIONS) IMPLANT

## 2018-08-25 NOTE — Transfer of Care (Signed)
Immediate Anesthesia Transfer of Care Note  Patient: Mark Frey  Procedure(s) Performed: Procedure(s): LAPAROSCOPY DIAGNOSTIC (N/A) LAPROSCOPIC LOOP ILEOSTOMY (N/A)  Patient Location: PACU  Anesthesia Type:General  Level of Consciousness:  sedated, patient cooperative and responds to stimulation  Airway & Oxygen Therapy:Patient Spontanous Breathing and Patient connected to face mask oxgen  Post-op Assessment:  Report given to PACU RN and Post -op Vital signs reviewed and stable  Post vital signs:  Reviewed and stable  Last Vitals:  Vitals:   08/24/18 1944 08/25/18 0619  BP: 98/79 124/67  Pulse: 100 89  Resp: 18 20  Temp: 37 C 36.7 C  SpO2: 10% 258%    Complications: No apparent anesthesia complications

## 2018-08-25 NOTE — Progress Notes (Signed)
PHARMACY - ADULT TOTAL PARENTERAL NUTRITION CONSULT NOTE   Pharmacy Consult for TPN  Indication: bowel obstruction  Patient Measurements: Height: 5' 9"  (175.3 cm) Weight: 191 lb 2.2 oz (86.7 kg) IBW/kg (Calculated) : 70.7 TPN AdjBW (KG): 75.6 Body mass index is 28.23 kg/m. Usual Weight:     Insulin Requirements: on sensitive SSI q6h, 1 unit in last 24 hours   Current Nutrition: NPO   IVF: D5-NS at [facility] access:  [place] placed 03/27/18  TPN start date: 08/21/2018   ASSESSMENT                                                                                                          HPI: Pharmacy is consulted to start TPN on 62 yo male with a diagnosis of SBO. Pt will need a surgical intervention and needs to improve nutritional status prior to any procedure as prealbumin is 6.   Significant events:   12/9 Start TPN 12/13 Pt to OR for laparoscopy, sigmoid colectomy with colostomy, possible small bowel resection and possible diverting loop ostomy.   Today:   Glucose ( goal <150)  - WNL, range from 106 to 133    Electrolytes - K  WNL, stable, Calcium is 8.6, but Corrected Calcium is 10, WL,  Phosphorous slightly  elevated at 4.7, other labs WNL   Renal - SCr stable, WNL   LFTs - WNL   ( 12/10)   TGs - 110 ( 12/10)    Prealbumin - <5 ( 12/10)  NUTRITIONAL GOALS                                                                                             RD recs: ( 12/9)  Estimated Nutritional Needs:   Kcal:  2400-2600 kcal  Protein:  115-130 grams  Fluid:  >/= 2.4 L/day   PLAN                                                                                                                         At 1800 today:  Continue TPN to 100 ml/hr, the target rate   Maintain at tolerated goal rate of 100  ml/hr  Target goal rate TPN will provide 2472 Kcal, 132 grams of protein, 360 grams of dextrose and 72 grams of lipids.   Adjusted potassium,  Magnesium, phosphate  and calcium in bag   TPN to contain standard multivitamins and trace elements.  Continue IVF to [facility] ml/hr at 1800  Maintain SSI at q6h. If BG remain stable at target rate and stable after surgery, may consider changing to q8h CBGs  BMP, magnesium, phosphate with AM labs   TPN lab panels on Mondays & Thursdays.  F/u daily.   Royetta Asal, PharmD, BCPS Pager [phone] 08/25/2018 9:27 AM

## 2018-08-25 NOTE — Progress Notes (Signed)
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   Assessment & Plan:   Principal Problem:   Bowel obstruction in setting of lymphoma and sigmoid stricture Active Problems:   Morbid obesity ([place])   Controlled diabetes mellitus type II without complication (HCC)   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HCC)   Deep vein thrombosis (DVT) of femoral vein of right lower extremity (HCC)   Protein-calorie malnutrition, severe   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HCC)   Abnormal CT of the abdomen   #) Bowel obstruction: Unclear if this is related to inflammation with resolved lymphoma versus persistent lymphoma in the abdomen.  Pending OR today - Continue TPN -General surgery following, appreciate recommendations, tentative plan is for diverting loop ileostomy  #) Right lower extremity DVT: -Hold apixaban 5 mg twice daily -Continue heparin drip goal PTT is 60-90, held 4 hours prior to surgery will likely restart several hours after surgery  #) Type diabetes: -Hold sitagliptin and metformin -Sliding scale insulin, AC at bedtime  #) High-grade B-cell lymphoma: Unclear if this is in remission or not per above.  Fluids: TPN Electrolytes: TPN Nutrition: TPN  Prophylaxis: Heparin drip  Disposition: Pending OR  Full code    Consultants:   General surgery  Oncology  Procedures:  None  Antimicrobials:   None   Subjective: This morning patient reports he is doing well.  He continues to have bowel movements.  He denies any nausea, vomiting, diarrhea, cough, congestion, rhinorrhea.  Objective: Vitals:   08/24/18  0520 08/24/18 1431 08/24/18 1944 08/25/18 0619  BP: 117/78 115/74 98/79 124/67  Pulse: 88 93 100 89  Resp: 16 20 18 20   Temp: 97.8 F (36.6 C) 98.2 F (36.8 C) 98.6 F (37 C) 98.1 F (36.7 C)  TempSrc: Oral  Oral Oral  SpO2: 98% 98% 99% 100%  Weight:    86.7 kg  Height:        Intake/Output Summary (Last 24 hours) at 08/25/2018 1133 Last data filed at 08/25/2018 1130 Gross per 24 hour  Intake 2822.63 ml  Output 2960 ml  Net -137.37 ml   Filed Weights   08/21/18 1140 08/25/18 0619  Weight: 90.8 kg 86.7 kg    Examination:  General exam: Appears calm and comfortable  Respiratory system: Clear to auscultation. Respiratory effort normal. Cardiovascular system: Regular rate and rhythm, no murmurs Gastrointestinal system: Soft, mild epigastric tenderness, no rebound or guarding, hyperactive bowel sounds Central nervous system: Alert and oriented.  Grossly intact, moving all extremities Extremities: Trace lower extremity edema. Skin: Port site is clean dry and intact Psychiatry: Judgement and insight appear normal. Mood & affect appropriate.     Data Reviewed: I have personally reviewed following labs and imaging studies  CBC: Recent Labs  Lab 08/19/18 0607 08/20/18 0417 08/21/18 0418 08/22/18 0633 08/23/18 0613 08/24/18 0607 08/25/18 0605  WBC 5.8 5.0 5.0 4.7 3.5* 4.6 4.5  NEUTROABS 3.4 3.2 2.7 2.2 1.4*  --   --   HGB 9.3* 8.5* 8.4* 8.2* 8.4* 8.1* 8.6*  HCT 32.0* 28.9* 28.0* 28.3* 28.6* 27.5* 28.6*  MCV 101.6* 101.4* 101.4* 102.9* 99.3 101.9* 99.0  PLT 213 224 176 194  157 129* 888*   Basic Metabolic Panel: Recent Labs  Lab 08/21/18 0418 08/22/18 0633 08/23/18 0613 08/24/18 0607 08/25/18 0605  NA 137 137 134* 136 135  K 3.2* 3.4* 4.3 5.0 4.8  CL 103 103 104 106 104  CO2 27 27 23 25 22   GLUCOSE 121* 148* 146* 121* 131*  BUN 10 9 8 9 12   CREATININE 0.48* 0.53* 0.46* 0.48* 0.45*  CALCIUM 7.9* 8.0* 7.9* 8.4* 8.6*  MG  --  1.8 1.8 2.0 1.9  PHOS  --  3.0  2.6 4.1 4.7*   GFR: Estimated Creatinine Clearance: 104.4 mL/min (A) (by C-G formula based on SCr of 0.45 mg/dL (L)). Liver Function Tests: Recent Labs  Lab 08/22/18 9169 08/24/18 0607  AST 8* 9*  ALT 8 6  ALKPHOS 39 37*  BILITOT 0.4 0.6  PROT 4.5* 4.9*  ALBUMIN 2.2* 2.2*   No results for input(s): LIPASE, AMYLASE in the last 168 hours. No results for input(s): AMMONIA in the last 168 hours. Coagulation Profile: No results for input(s): INR, PROTIME in the last 168 hours. Cardiac Enzymes: No results for input(s): CKTOTAL, CKMB, CKMBINDEX, TROPONINI in the last 168 hours. BNP (last 3 results) No results for input(s): PROBNP in the last 8760 hours. HbA1C: No results for input(s): HGBA1C in the last 72 hours. CBG: Recent Labs  Lab 08/24/18 0622 08/24/18 1239 08/24/18 1757 08/25/18 0618 08/25/18 1004  GLUCAP 127* 114* 106* 133* 112*   Lipid Profile: No results for input(s): CHOL, HDL, LDLCALC, TRIG, CHOLHDL, LDLDIRECT in the last 72 hours. Thyroid Function Tests: No results for input(s): TSH, T4TOTAL, FREET4, T3FREE, THYROIDAB in the last 72 hours. Anemia Panel: No results for input(s): VITAMINB12, FOLATE, FERRITIN, TIBC, IRON, RETICCTPCT in the last 72 hours. Sepsis Labs: No results for input(s): PROCALCITON, LATICACIDVEN in the last 168 hours.  Recent Results (from the past 240 hour(s))  MRSA PCR Screening     Status: None   Collection Time: 08/24/18 11:26 AM  Result Value Ref Range Status   MRSA by PCR NEGATIVE NEGATIVE Final    Comment:        The GeneXpert MRSA Assay (FDA approved for NASAL specimens only), is one component of a comprehensive MRSA colonization surveillance program. It is not intended to diagnose MRSA infection nor to guide or monitor treatment for MRSA infections. Performed at [facility], [place] [address], [place], [place] [address]          Radiology Studies: Dg Chest Port 1 View  Result Date:  08/24/2018 CLINICAL DATA:  Preoperative evaluation EXAM: PORTABLE CHEST 1 VIEW COMPARISON:  None. FINDINGS: Cardiac shadows within normal limits. Right chest wall port is noted in satisfactory position. No acute infiltrate or effusion is seen. No bony abnormality is noted. IMPRESSION: No acute abnormality seen. Electronically Signed   By: Inez Catalina M.D.   On: 08/24/2018 09:24        Scheduled Meds: . [MAR Hold] insulin aspart  0-9 Units Subcutaneous Q6H  . scopolamine  1 patch Transdermal Once  . scopolamine       Continuous Infusions: . dextrose 5 % and 0.45% NaCl 10 mL/hr at 08/24/18 1028  . [MAR Hold] famotidine (PEPCID) IV 20 mg (08/24/18 2124)  . lactated ringers 1,000 mL (08/25/18 0948)  . TPN ADULT (ION) 100 mL/hr at 08/25/18 1011  . TPN ADULT (ION)       LOS: 8 days    Time spent: [place],  MD [facility]  If 7PM-7AM, please contact night-coverage [email or web address] Password [facility] 08/25/2018, 11:33 AM

## 2018-08-25 NOTE — Anesthesia Preprocedure Evaluation (Addendum)
Anesthesia Evaluation  Patient identified by MRN, date of birth, ID band Patient awake    Reviewed: Allergy & Precautions, NPO status , Patient's Chart, lab work & pertinent test results  History of Anesthesia Complications Negative for: history of anesthetic complications  Airway Mallampati: II  TM Distance: >3 FB Neck ROM: Full    Dental no notable dental hx. (+) Dental Advisory Given   Pulmonary neg pulmonary ROS,    Pulmonary exam normal breath sounds clear to auscultation       Cardiovascular hypertension, + Peripheral Vascular Disease  Normal cardiovascular exam  Bilateral PTE   Neuro/Psych negative neurological ROS  negative psych ROS   GI/Hepatic Neg liver ROS, Intra abdominal mass Gi bleeding   Endo/Other  diabetes, Well Controlled, Type 2, Oral Hypoglycemic AgentsMorbid obesity  Renal/GU negative Renal ROS  negative genitourinary   Musculoskeletal negative musculoskeletal ROS (+)   Abdominal (+) + obese,   Peds  Hematology  (+) anemia , Hx of Lymphoma. Heparin stopped. Anticoagulated due to DVT/PTE   Anesthesia Other Findings   Reproductive/Obstetrics                            Anesthesia Physical  Anesthesia Plan  ASA: III  Anesthesia Plan: General   Post-op Pain Management:    Induction: Intravenous, Rapid sequence and Cricoid pressure planned  PONV Risk Score and Plan: 4 or greater and Ondansetron, Treatment may vary due to age or medical condition, Dexamethasone, Scopolamine patch - Pre-op and Diphenhydramine  Airway Management Planned: Oral ETT  Additional Equipment:   Intra-op Plan:   Post-operative Plan: Extubation in OR  Informed Consent: I have reviewed the patients History and Physical, chart, labs and discussed the procedure including the risks, benefits and alternatives for the proposed anesthesia with the patient or authorized representative who has  indicated his/her understanding and acceptance.   Dental advisory given  Plan Discussed with: CRNA, Anesthesiologist and Surgeon  Anesthesia Plan Comments:      Anesthesia Quick Evaluation

## 2018-08-25 NOTE — Progress Notes (Signed)
[place] for heparin Indication: hx pulmonary embolus and DVT (home Eliquis on hold)  Allergies  Allergen Reactions  . Ciprofloxacin Other (See Comments)    Leg tingling    Patient Measurements: Height: 5' 9"  (175.3 cm) Weight: 191 lb 2.2 oz (86.7 kg) IBW/kg (Calculated) : 70.7 Heparin Dosing Weight: 89 kg  Vital Signs: Temp: 97.8 F (36.6 C) (12/13 1233) Temp Source: Oral (12/13 0619) BP: 125/84 (12/13 1233) Pulse Rate: 98 (12/13 1230)  Labs: Recent Labs    08/23/18 0613  08/23/18 2247 08/24/18 0607 08/24/18 1152 08/25/18 0605  HGB 8.4*  --   --  8.1*  --  8.6*  HCT 28.6*  --   --  27.5*  --  28.6*  PLT 157  --   --  129*  --  146*  HEPARINUNFRC 0.26*   < > 0.20* 0.33 0.32  --   CREATININE 0.46*  --   --  0.48*  --  0.45*   < > = values in this interval not displayed.    Estimated Creatinine Clearance: 104.4 mL/min (A) (by C-G formula based on SCr of 0.45 mg/dL (L)).   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  Abd CT on 12/5 showed bowel obstruction. Patient was transitioned from Eliquis to heparin drip on admission since patient is NPO and in case surgical intervention is needed.   Baseline INR, aPTT: not done  Prior anticoagulation: Eliquis 5 mg bid, LD 12/4 at 1800  Significant events:  Today, 08/25/2018:  CBC: Hgb low but stable, Plt WNL  No bleeding or infusion issues prior to stop of heparin drip this AM at 0400. Pt was then taken to OR today.   Per surgeon orders ok to restart heparin drip tonight at 2300  Goal of Therapy: Heparin level 0.3-0.7 units/ml Monitor platelets by anticoagulation protocol: Yes  Plan:  Restart heparin IV infusion at 2150 units/hr tonight at 2300  Will avoid bolus since pt had surgical procedure today    HL 6 hours after start of infusion    Daily CBC while on heparin   Monitor for signs of bleeding or thrombosis    Royetta Asal, PharmD, BCPS Pager [phone] 08/25/2018 12:58 PM

## 2018-08-25 NOTE — Anesthesia Procedure Notes (Signed)
Procedure Name: Intubation Date/Time: 08/25/2018 10:24 AM Performed by: Anne Fu, CRNA Pre-anesthesia Checklist: Patient identified, Emergency Drugs available, Suction available, Patient being monitored and Timeout performed Patient Re-evaluated:Patient Re-evaluated prior to induction Oxygen Delivery Method: Circle system utilized Preoxygenation: Pre-oxygenation with 100% oxygen Induction Type: IV induction Ventilation: Mask ventilation without difficulty Laryngoscope Size: Mac and 4 Grade View: Grade I Tube type: Oral Tube size: 7.5 mm Number of attempts: 1 Airway Equipment and Method: Stylet Placement Confirmation: ETT inserted through vocal cords under direct vision,  positive ETCO2 and breath sounds checked- equal and bilateral Secured at: 21 cm Tube secured with: Tape Dental Injury: Teeth and Oropharynx as per pre-operative assessment

## 2018-08-25 NOTE — Interval H&P Note (Signed)
History and Physical Interval Note:  08/25/2018 9:22 AM  Mark Frey  has presented today for surgery, with the diagnosis of COLONIC STRICTURE AND PARTIAL SMALL BOWEL OBSTRUCTION  The various methods of treatment have been discussed with the patient and family. After consideration of risks, benefits and other options for treatment, the patient has consented to  Procedure(s): LAPAROSCOPY DIAGNOSTIC (N/A) SIGMOID COLECTOMY WITH COLOSTOMY (N/A) POSSIBLE SMALL BOWEL RESECTION AND POSSIBLE DIVERTING LOOP OSTOMY (N/A) as a surgical intervention .  The patient's history has been reviewed, patient examined, no change in status, stable for surgery.  I have reviewed the patient's chart and labs.  Questions were answered to the patient's satisfaction.    Plan is diagnostic laparoscopy; lap loop ileostomy, possible bx. Plan is not to do bowel resection given extensive scarring/disease in pelvis and current nutritional status.   Leighton Ruff. Redmond Pulling, MD, FACS General, Bariatric, & Minimally Invasive Surgery [facility] [place]  Greer Pickerel

## 2018-08-25 NOTE — Op Note (Signed)
08/17/2018 - 08/25/2018  11:43 AM  PATIENT:  Mark Frey  62 y.o. male  PRE-OPERATIVE DIAGNOSIS: Sigmoid COLONIC STRICTURE AND PARTIAL SMALL BOWEL OBSTRUCTION; h/o abdominal lymphoma  POST-OPERATIVE DIAGNOSIS:    PROCEDURE:  Procedure(s): LAPAROSCOPY DIAGNOSTIC LAPROSCOPIC LOOP ILEOSTOMY  SURGEON:  Surgeon(s): Greer Pickerel, MD   ASSISTANTS: Armandina Gemma, MD (an assistant was required for this case due to the unknown nature scar tissue that we may have encountered during this procedure given the patient's history of extensive abdominal lymphoma)  ANESTHESIA:   general  DRAINS: none   LOCAL MEDICATIONS USED:  MARCAINE     SPECIMEN:  No Specimen  DISPOSITION OF SPECIMEN:  N/A  COUNTS:  YES  INDICATION FOR PROCEDURE: 62 year old gentleman who was diagnosed with high-grade B-cell lymphoma after an incidental finding in late June of this year when he presented with bilateral PEs and DVT was readmitted recently with ongoing recurrent bowel obstruction.  At the time of his initial presentation he was found to have a very large infiltrating right-sided intra-abdominal mass encasing the distal small bowel, part of the sigmoid colon retroperitoneum and right pelvic vessels.  He underwent chemotherapy for his lymphoma.  He had good response.  But he has had several admissions for recurrent small bowel obstruction as well as areas in the distal sigmoid colon with stricture.  After reviewing his CT scans and getting a CT enterography I was quite concerned about the likelihood of being able to resect the sigmoid colon without getting into the terminal ileum and cecum which probably would need to be resected as noted ongoing severe wall thickening of the TI.  However the patient was severely malnourished with a prealbumin of around 5.  It was unclear whether or not the colonic stricture and ongoing thickening of the TI and abnormal appearing appendix was due to residual disease or just chronic  fibrosis from prior tumor.  Part of his sigmoid colon did light up on PET CT and it could not be differentiated whether or not it was due to diverticulitis or residual lymphoma.  Because of his mild nutrition I recommended diagnostic laparoscopy and a diverting loop ileostomy since he had evidence of partial bowel obstruction not only in the sigmoid colon but also in the terminal ileum.  We had had an extensive discussion over the course of multiple days regarding his scans and treatment options as well as the risk and benefits of surgery which have been separately documented  PROCEDURE: After obtaining informed consent the patient was taken to operating room 4 at [facility] and placed supine on the operating room table.  General endotracheal anesthesia was established.  Sequential compression devices were placed.  A Foley catheter was placed.  His arms were tucked at his side with the appropriate padding.  He received IV antibiotic prior to skin incision.  Surgical timeout was performed.  He had been previously marked by the ostomy nurses for ostomy location.  A small supraumbilical incision was made with an 11 blade.  Subcutaneous tissue was divided.  Fascia was incised and the abdominal cavity was entered.  A pursestring suture was placed around the fascial edges with a 0 Vicryl.  A 12 mm Hassan trocar was placed and pneumoperitoneum was immediately established.  Laparoscope was advanced and there was no evidence of injury to surrounding viscera.  A 5 mm trocar was placed in the left upper quadrant and another one in the left lower quadrant all under direct visualization.  He had omentum  tethered to the right lower quadrant to the terminal ileum and cecum.  There is no gross peritoneal or omental implants.  Both sides of the liver appeared grossly normal.  He had evidence of colonic distention.  Palpation of the cecum and terminal ileum felt that it was thickened and woody.  I was able to visualize  the sigmoid colon extending into the pelvis.  The left side of the sigmoid colon appeared densely adherent to the left pelvic sidewall.  There is no overt evidence of active diverticulitis.  His diverticuli were visible.  The sigmoid colon at the level of the pelvic inlet felt very woody with an atraumatic bowel grasper.  There did appear to be a window between the sigmoid colon and the cecum and the appendix.  However as stated above the left and anterior lateral left side of the sigmoid colon appeared densely adherent to the pelvic sidewall and lower abdominal wall.  Likewise the terminal ileum and proximal cecum were densely adherent to the right lower quadrant and appeared fixated.  I took down the tongue of omentum with the harmonic scalpel.  I identified the terminal ileum and started running the small bowel proximally using atraumatic bowel graspers.  I found an area of normal-appearing terminal ileum approximately 35 cm from the ileocecal valve.  This area appeared to easily reach the anterior abdominal wall in the right abdomen which had been previously marked by the ostomy nurse.  The bowel was grasped with a bowel clamp.  At this point, I made a circular skin incision with Bovie electrocautery.  Divided the subcutaneous tissue with [employer]-Nav[employer]'s.  Incised anterior fascia in a cruciate fashion and spread the rectus muscles with a Kelly.  The peritoneum was incised.  At this point I was able to deliver the previously clamped terminal ileum up through the fascial defect and grabbed it with a Babcock.  I ended up making the skin incision slightly larger because there was a little bit tight.  I reinsufflated the abdomen and return laparoscopically and confirmed that the loop ileostomy was in the correct configuration and the mesentery was not twisted.  I tied down the previously placed pursestring suture at the [facility] trocar location and closed the fascial defect at the umbilicus.  Pneumoperitoneum was released  and the remaining 5 mm trochars were removed.  We made a small window in the mesentery just next of the bowel lumen with a hemostat and I threaded a red rubber catheter behind the bowel lumen.  At this point I closed skin incisions with a 4 Monocryl in subcuticular fashion and then placed Dermabond.  We then matured the loop ileostomy in typical fashion.  On the lower aspect of the loop I made a transverse incision approximately 75% of the circumference.  The inferior portion was then sutured to the dermis with 3 interrupted 3-0 Vicryl sutures in a two-point fixation.  The superior portion of the ostomy was secured to the dermis and a three-point fixation typical Brooke fashion.  There was a little bit of bleeding from the mucosa.  The red rubber was then sutured to the skin with 2 nylon sutures.  An ostomy device was applied.  The Foley catheter was removed.  All needle, instrument sponge counts were correct x2.  There were no immediate complications.  Patient tolerated change well.  He was transferred to the recovery room in stable condition.  I believe he can restart his heparin drip at around 2300 this evening given his recent  history of bilateral PEs and DVT  PLAN OF CARE: Admit to inpatient   PATIENT DISPOSITION:  PACU - hemodynamically stable.   Delay start of Pharmacological VTE agent (>24hrs) due to surgical blood loss or risk of bleeding:  no  Leighton Ruff. Redmond Pulling, MD, FACS General, Bariatric, & Minimally Invasive Surgery [facility], [place]

## 2018-08-25 NOTE — Anesthesia Postprocedure Evaluation (Signed)
Anesthesia Post Note  Patient: Mark Frey  Procedure(s) Performed: LAPAROSCOPY DIAGNOSTIC (N/A Abdomen) LAPROSCOPIC LOOP ILEOSTOMY (N/A Abdomen)     Patient location during evaluation: PACU Anesthesia Type: General Level of consciousness: sedated Pain management: pain level controlled Vital Signs Assessment: post-procedure vital signs reviewed and stable Respiratory status: spontaneous breathing and respiratory function stable Cardiovascular status: stable Postop Assessment: no apparent nausea or vomiting Anesthetic complications: no    Last Vitals:  Vitals:   08/25/18 1230 08/25/18 1233  BP: 125/84 125/84  Pulse: 98   Resp: 14   Temp:  36.6 C  SpO2: 99%     Last Pain:  Vitals:   08/25/18 1233  TempSrc:   PainSc: 3                  Mark Frey DANIEL

## 2018-08-26 ENCOUNTER — Encounter (HOSPITAL_COMMUNITY): Payer: Self-pay | Admitting: General Surgery

## 2018-08-26 LAB — COMPREHENSIVE METABOLIC PANEL
ALT: 9 U/L (ref 0–44)
AST: 10 U/L — ABNORMAL LOW (ref 15–41)
Albumin: 3.3 g/dL — ABNORMAL LOW (ref 3.5–5.0)
Alkaline Phosphatase: 44 U/L (ref 38–126)
Anion gap: 9 (ref 5–15)
BILIRUBIN TOTAL: 0.5 mg/dL (ref 0.3–1.2)
BUN: 14 mg/dL (ref 8–23)
CO2: 24 mmol/L (ref 22–32)
Calcium: 9.4 mg/dL (ref 8.9–10.3)
Chloride: 102 mmol/L (ref 98–111)
Creatinine, Ser: 0.55 mg/dL — ABNORMAL LOW (ref 0.61–1.24)
GFR calc Af Amer: >60 mL/min (ref >60)
GFR calc non Af Amer: >60 mL/min (ref >60)
Glucose, Bld: 146 mg/dL — ABNORMAL HIGH (ref 70–99)
Potassium: 5.1 mmol/L (ref 3.5–5.1)
Sodium: 135 mmol/L (ref 135–145)
TOTAL PROTEIN: 6.2 g/dL — AB (ref 6.5–8.1)

## 2018-08-26 LAB — PHOSPHORUS: Phosphorus: 3.8 mg/dL (ref 2.5–4.6)

## 2018-08-26 LAB — HEPARIN LEVEL (UNFRACTIONATED): Heparin Unfractionated: 0.42 IU/mL (ref 0.30–0.70)

## 2018-08-26 LAB — CBC
HEMATOCRIT: 32.2 % — AB (ref 39.0–52.0)
Hemoglobin: 9.7 g/dL — ABNORMAL LOW (ref 13.0–17.0)
MCH: 30.1 pg (ref 26.0–34.0)
MCHC: 30.1 g/dL (ref 30.0–36.0)
MCV: 100 fL (ref 80.0–100.0)
Platelets: 153 10*3/uL (ref 150–400)
RBC: 3.22 MIL/uL — ABNORMAL LOW (ref 4.22–5.81)
RDW: 19.8 % — ABNORMAL HIGH (ref 11.5–15.5)
WBC: 5.9 10*3/uL (ref 4.0–10.5)
nRBC: 0 % (ref 0.0–0.2)

## 2018-08-26 LAB — GLUCOSE, CAPILLARY
Glucose-Capillary: 125 mg/dL — ABNORMAL HIGH (ref 70–99)
Glucose-Capillary: 140 mg/dL — ABNORMAL HIGH (ref 70–99)
Glucose-Capillary: 146 mg/dL — ABNORMAL HIGH (ref 70–99)
Glucose-Capillary: 168 mg/dL — ABNORMAL HIGH (ref 70–99)
Glucose-Capillary: 175 mg/dL — ABNORMAL HIGH (ref 70–99)

## 2018-08-26 LAB — MAGNESIUM: Magnesium: 2.1 mg/dL (ref 1.7–2.4)

## 2018-08-26 MED ORDER — TRAVASOL 10 % IV SOLN
INTRAVENOUS | Status: AC
  Administered 2018-08-26: 18:00:00 via INTRAVENOUS
  Filled 2018-08-26: qty 1200

## 2018-08-26 NOTE — Progress Notes (Signed)
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   Assessment & Plan:   Principal Problem:   Bowel obstruction in setting of lymphoma and sigmoid stricture Active Problems:   Morbid obesity ([place])   Controlled diabetes mellitus type II without complication (HCC)   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[place])   Deep vein thrombosis (DVT) of femoral vein of right lower extremity (HCC)   Protein-calorie malnutrition, severe   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HCC)   Abnormal CT of the abdomen   #) Bowel obstruction status post laparoscopic loop ileostomy on 08/25/2018: Per discussion with general surgery patient has had multiple complex adhesions in the abdomen and pelvis.  Reportedly as these are active on PET scan there is a concern that there residual lymphoma versus possible desmoplastic response after cured lymphoma versus possible diverticulitis.  Reportedly on the diagnostic laparoscopy there was no evidence of diverticulitis noted. - Continue TPN until tolerating p.o. -General surgery following, appreciate recommendations -Started on clear liquid diet, plan to advance aggressively per general surgery  #) Right lower extremity DVT: -Hold apixaban 5 mg twice daily, will restart if patient tolerating p.o. -Continue heparin drip goal PTT is 60-90,   #) Type diabetes: -Hold sitagliptin and metformin -Sliding scale insulin, AC at bedtime  #) High-grade B-cell lymphoma: Unclear if this is in remission or not per above.  Will discuss concerned that there is no active diverticulitis noted.  Fluids: TPN Electrolytes:  TPN Nutrition: TPN and clear liquid diet  Prophylaxis: Heparin drip  Disposition: Pending transitioning to orals and discontinuing TPN  Full code    Consultants:   General surgery  Oncology  Procedures:  None  Antimicrobials:  Perioperative  Subjective: This morning patient reports he is doing well.  He reports that they had to empty his loop ileostomy bag several times.  He reports feeling hungry.  He has been tolerating clears.  He denies any abdominal pain, nausea, vomiting, diarrhea, cough, congestion, rhinorrhea.  Objective: Vitals:   08/25/18 2050 08/26/18 0108 08/26/18 0434 08/26/18 0630  BP: 125/84 116/73 110/75   Pulse: (!) 108 (!) 101 80   Resp: 20 20 18    Temp: 98.4 F (36.9 C) 97.8 F (36.6 C) 97.6 F (36.4 C)   TempSrc: Oral Oral Oral   SpO2: 100% 100% 100% 98%  Weight:   89.5 kg   Height:        Intake/Output Summary (Last 24 hours) at 08/26/2018 1036 Last data filed at 08/26/2018 0950 Gross per 24 hour  Intake 2700.03 ml  Output 4025 ml  Net -1324.97 ml   Filed Weights   08/25/18 0619 08/26/18 0434  Weight: 86.7 kg 89.5 kg    Examination:  General exam: Appears calm and comfortable  Respiratory system: Clear to auscultation. Respiratory effort normal. Cardiovascular system: Regular rate and rhythm, no murmurs Gastrointestinal system: Soft, high-pitched bowel sounds throughout, ileostomy site is clean dry and intact Central nervous system: Alert and oriented.  Grossly intact, moving all extremities Extremities: Trace lower extremity edema. Skin: Port site is clean dry and intact Psychiatry: Judgement and insight appear normal. Mood & affect appropriate.     Data Reviewed: I  have personally reviewed following labs and imaging studies  CBC: Recent Labs  Lab 08/20/18 0417 08/21/18 0418 08/22/18 0998 08/23/18 0613 08/24/18 0607 08/25/18 0605 08/26/18 0806  WBC 5.0 5.0 4.7 3.5* 4.6 4.5 5.9  NEUTROABS 3.2 2.7 2.2 1.4*  --   --    --   HGB 8.5* 8.4* 8.2* 8.4* 8.1* 8.6* 9.7*  HCT 28.9* 28.0* 28.3* 28.6* 27.5* 28.6* 32.2*  MCV 101.4* 101.4* 102.9* 99.3 101.9* 99.0 100.0  PLT 224 176 194 157 129* 146* 338   Basic Metabolic Panel: Recent Labs  Lab 08/22/18 0633 08/23/18 0613 08/24/18 0607 08/25/18 0605 08/26/18 0806  NA 137 134* 136 135 135  K 3.4* 4.3 5.0 4.8 5.1  CL 103 104 106 104 102  CO2 27 23 25 22 24   GLUCOSE 148* 146* 121* 131* 146*  BUN 9 8 9 12 14   CREATININE 0.53* 0.46* 0.48* 0.45* 0.55*  CALCIUM 8.0* 7.9* 8.4* 8.6* 9.4  MG 1.8 1.8 2.0 1.9 2.1  PHOS 3.0 2.6 4.1 4.7* 3.8   GFR: Estimated Creatinine Clearance: 105.9 mL/min (A) (by C-G formula based on SCr of 0.55 mg/dL (L)). Liver Function Tests: Recent Labs  Lab 08/22/18 0633 08/24/18 0607 08/26/18 0806  AST 8* 9* 10*  ALT 8 6 9   ALKPHOS 39 37* 44  BILITOT 0.4 0.6 0.5  PROT 4.5* 4.9* 6.2*  ALBUMIN 2.2* 2.2* 3.3*   No results for input(s): LIPASE, AMYLASE in the last 168 hours. No results for input(s): AMMONIA in the last 168 hours. Coagulation Profile: No results for input(s): INR, PROTIME in the last 168 hours. Cardiac Enzymes: No results for input(s): CKTOTAL, CKMB, CKMBINDEX, TROPONINI in the last 168 hours. BNP (last 3 results) No results for input(s): PROBNP in the last 8760 hours. HbA1C: No results for input(s): HGBA1C in the last 72 hours. CBG: Recent Labs  Lab 08/25/18 1004 08/25/18 1200 08/25/18 1735 08/25/18 2358 08/26/18 0621  GLUCAP 112* 142* 109* 125* 175*   Lipid Profile: No results for input(s): CHOL, HDL, LDLCALC, TRIG, CHOLHDL, LDLDIRECT in the last 72 hours. Thyroid Function Tests: No results for input(s): TSH, T4TOTAL, FREET4, T3FREE, THYROIDAB in the last 72 hours. Anemia Panel: No results for input(s): VITAMINB12, FOLATE, FERRITIN, TIBC, IRON, RETICCTPCT in the last 72 hours. Sepsis Labs: No results for input(s): PROCALCITON, LATICACIDVEN in the last 168 hours.  Recent Results (from the past 240  hour(s))  MRSA PCR Screening     Status: None   Collection Time: 08/24/18 11:26 AM  Result Value Ref Range Status   MRSA by PCR NEGATIVE NEGATIVE Final    Comment:        The GeneXpert MRSA Assay (FDA approved for NASAL specimens only), is one component of a comprehensive MRSA colonization surveillance program. It is not intended to diagnose MRSA infection nor to guide or monitor treatment for MRSA infections. Performed at [facility], [place] [address] [place], [place] [address]          Radiology Studies: No results found.      Scheduled Meds: . acetaminophen  650 mg Oral Q6H  . insulin aspart  0-9 Units Subcutaneous Q6H   Continuous Infusions: . heparin 2,150 Units/hr (08/25/18 2331)  . TPN ADULT (ION) 100 mL/hr at 08/25/18 1822  . TPN ADULT (ION)       LOS: 9 days    Time spent: [place], MD [facility]  If 7PM-7AM, please contact night-coverage [email or web address] Password [email or web address]  08/26/2018, 10:36 AM

## 2018-08-26 NOTE — Progress Notes (Signed)
ANTICOAGULATION CONSULT NOTE - Follow Up Consult  Pharmacy Consult for heparin Indication: hx pulmonary embolus and DVT (home Eliquis on hold)  Allergies  Allergen Reactions  . Ciprofloxacin Other (See Comments)    Leg tingling    Patient Measurements: Height: 5' 9"  (175.3 cm) Weight: 197 lb 6.4 oz (89.5 kg) IBW/kg (Calculated) : 70.7 Heparin Dosing Weight: 89 kg  Vital Signs: Temp: 97.6 F (36.4 C) (12/14 0434) Temp Source: Oral (12/14 0434) BP: 110/75 (12/14 0434) Pulse Rate: 80 (12/14 0434)  Labs: Recent Labs    08/23/18 2247  08/24/18 0607 08/24/18 1152 08/25/18 0605 08/26/18 0806  HGB  --    < > 8.1*  --  8.6* 9.7*  HCT  --   --  27.5*  --  28.6* 32.2*  PLT  --   --  129*  --  146* 153  HEPARINUNFRC 0.20*  --  0.33 0.32  --   --   CREATININE  --   --  0.48*  --  0.45*  --    < > = values in this interval not displayed.    Estimated Creatinine Clearance: 105.9 mL/min (A) (by C-G formula based on SCr of 0.45 mg/dL (L)).   Medications:  - on Eliquis 5 mg bid PTA (last dose taken on 12/4 at 1800)  Assessment: Patient's a 62 y.o M with hx high-grade B-cell lymphoma with intra-abdominal mass encasing the small bowel and PE/DVT on Eliquis PTA, presented to the ED on 12/5 with c/o constipation and abd distention.  Abd CT on 12/5 showed bowel obstruction. Patient was transitioned from Eliquis to heparin drip on admission since patient is NPO and in case surgical intervention is needed.  Significant events: 12/5: NGT; Abd CT showed sigmoid obstruction 12/8: flex sigmoidoscopy and colonoscopy (intrinsic narrowing of sigmoid colon) 12/13: loop ileostomy; heparin drip held at 0400 for procedure and restarted back at 2300  Today, 08/26/2018: - heparin level is therapeutic at 0.42 - cbc relatively stable - no bleeding documented  Goal of Therapy:  Heparin level 0.3-0.7 units/ml Monitor platelets by anticoagulation protocol: Yes   Plan:  - continue heparin drip  at 2150 units/hr - daily heparin level - monitor for s/s bleeding  Hannah Strader P 08/26/2018,8:31 AM

## 2018-08-26 NOTE — Progress Notes (Signed)
PHARMACY - ADULT TOTAL PARENTERAL NUTRITION CONSULT NOTE   Pharmacy Consult for TPN Indication: bowel obstruction  Patient Measurements: Height: 5' 9"  (175.3 cm) Weight: 197 lb 6.4 oz (89.5 kg) IBW/kg (Calculated) : 70.7 TPN AdjBW (KG): 75.6 Body mass index is 29.15 kg/m. Usual Weight:   Insulin Requirements: on sensitive SSI q6h (used 3 units in the past 24 hrs)  Current Nutrition: TPN  IVF: D5 0.45NS at 10 ml/hr; LR at 100 mlhr  Central access: Port placed 03/27/18  TPN start date: 08/21/18  ASSESSMENT                                                                                                          HPI: Patient is a 62 y.o M with hx DVT, lymphoma and recurrent bowel obstruction, presented to the ED on 12/519 from PCP office for management of bowel obstruction.  TPN started on 12/9 to improve nutritional status for surgical interventions.  Significant events:  12/5: NGT; Abd CT showed sigmoid obstruction 12/8: flex sigmoidoscopy and colonoscopy (intrinsic narrowing of sigmoid colon) 12/9: started on TPN 12/13: loop ileostomy 12/14: start liquid diet  Today:   Glucose (goal <150): all within goal range except one (175)  Electrolytes: K is at the upper end of goal range; phos, mag, CorrCa wnl  Renal: scr stable  LFTs: AST low  TGs: 110 (12/10)  Prealbumin: 6.2 (12/8), <5 (12/10)  NUTRITIONAL GOALS                                                                                             RD recs (12/1): Kcal:2400-2600 kcal Protein:115-130 grams Fluid:>/= 2.4 L/day  Custom TPN at goal rate of 100 ml/hr provides: - 120 g/day protein ( 50  g/L) -  74 g/day Lipid  ( 31 g/L) - 384  g/day Dextrose ( 16 %) -  2530 Kcal/day  PLAN                                                                                                                          At 1800 today:  Start TPN at 100  ml/hr. -  Provides 120g of protein,  384 g of dextrose, and  74 g of lipids which provides2530kCals per day,meeting100 % of goal kcal and goal AA - add pepcid 40 mg to TPN  Electrolytes in TPN: Standard except reduce K to 40 meq/L, Ca to 4 meq/L, Mag 12 meq/L, phos 5 mmol/L; Cl:Ac ratio 1:1  Plan to advance as tolerated to the goal rate.  TPN to contain standard multivitamins and trace elements.  LR at 100 ml/hr per MD; d/c D5 0.45 NS at kvo  Continue sensitive SSI q6h  TPN lab panels on Mondays & Thursdays.  F/u daily.  Joni Norrod P 08/26/2018,8:27 AM

## 2018-08-26 NOTE — Progress Notes (Signed)
Patient ID: Mark Frey, male   DOB: 1956-03-24, 62 y.o.   MRN: [id number] 1 Day Post-Op   Subjective: Feels better today.  Less pain.  He is hungry and wants to get out of bed.  Objective: Vital signs in last 24 hours: Temp:  [97.5 F (36.4 C)-98.4 F (36.9 C)] 97.6 F (36.4 C) (12/14 0434) Pulse Rate:  [80-108] 80 (12/14 0434) Resp:  [12-20] 18 (12/14 0434) BP: (110-133)/(73-84) 110/75 (12/14 0434) SpO2:  [98 %-100 %] 98 % (12/14 0630) Weight:  [89.5 kg] 89.5 kg (12/14 0434) Last BM Date: 08/24/18  Intake/Output from previous day: 12/13 0701 - 12/14 0700 In: 2700 [I.V.:2400; IV Piggyback:300] Out: 7282 [Urine:4375; Stool:250; Blood:50] Intake/Output this shift: No intake/output data recorded.  General appearance: alert, cooperative and no distress GI: Nondistended and nontender.  Ileostomy healthy Incision/Wound: [place]car sites without erythema or drainage  Lab Results:  Recent Labs    08/25/18 0605 08/26/18 0806  WBC 4.5 5.9  HGB 8.6* 9.7*  HCT 28.6* 32.2*  PLT 146* 153   BMET Recent Labs    08/25/18 0605 08/26/18 0806  NA 135 135  K 4.8 5.1  CL 104 102  CO2 22 24  GLUCOSE 131* 146*  BUN 12 14  CREATININE 0.45* 0.55*  CALCIUM 8.6* 9.4     Studies/Results: No results found.  Anti-infectives: Anti-infectives (From admission, onward)   Start     Dose/Rate Route Frequency Ordered Stop   08/25/18 0600  cefoTEtan (CEFOTAN) 2 g in sodium chloride 0.9 % 100 mL IVPB     2 g 200 mL/hr over 30 Minutes Intravenous On call to O.R. 08/24/18 0748 08/25/18 1040   08/18/18 1000  piperacillin-tazobactam (ZOSYN) IVPB 3.375 g  Status:  Discontinued     3.375 g 12.5 mL/hr over 240 Minutes Intravenous Every 8 hours 08/18/18 0858 08/22/18 1255   08/17/18 2300  piperacillin-tazobactam (ZOSYN) IVPB 3.375 g     3.375 g 100 mL/hr over 30 Minutes Intravenous  Once 08/17/18 2254 08/18/18 0203      Assessment/Plan: s/p Procedure(s): LAPAROSCOPY  DIAGNOSTIC LAPROSCOPIC LOOP ILEOSTOMY Doing well postoperatively.  Start liquid diet and advance as tolerated.  Out of bed and ambulate.   LOS: 9 days    Edward Jolly 08/26/2018

## 2018-08-27 LAB — COMPREHENSIVE METABOLIC PANEL
ALT: 12 U/L (ref 0–44)
Albumin: 3.1 g/dL — ABNORMAL LOW (ref 3.5–5.0)
Alkaline Phosphatase: 50 U/L (ref 38–126)
Anion gap: 10 (ref 5–15)
BUN: 14 mg/dL (ref 8–23)
CO2: 24 mmol/L (ref 22–32)
Calcium: 9.3 mg/dL (ref 8.9–10.3)
Chloride: 101 mmol/L (ref 98–111)
Creatinine, Ser: 0.57 mg/dL — ABNORMAL LOW (ref 0.61–1.24)
GFR calc Af Amer: >60 mL/min (ref >60)
GFR calc non Af Amer: >60 mL/min (ref >60)
Glucose, Bld: 163 mg/dL — ABNORMAL HIGH (ref 70–99)
Potassium: 4.5 mmol/L (ref 3.5–5.1)
Sodium: 135 mmol/L (ref 135–145)
Total Bilirubin: 0.5 mg/dL (ref 0.3–1.2)
Total Protein: 5.9 g/dL — ABNORMAL LOW (ref 6.5–8.1)

## 2018-08-27 LAB — GLUCOSE, CAPILLARY
Glucose-Capillary: 155 mg/dL — ABNORMAL HIGH (ref 70–99)
Glucose-Capillary: 153 mg/dL — ABNORMAL HIGH (ref 70–99)

## 2018-08-27 LAB — CBC
HCT: 31 % — ABNORMAL LOW (ref 39.0–52.0)
Hemoglobin: 9.2 g/dL — ABNORMAL LOW (ref 13.0–17.0)
MCH: 30.1 pg (ref 26.0–34.0)
MCHC: 29.7 g/dL — ABNORMAL LOW (ref 30.0–36.0)
MCV: 101.3 fL — ABNORMAL HIGH (ref 80.0–100.0)
Platelets: 149 10*3/uL — ABNORMAL LOW (ref 150–400)
RBC: 3.06 MIL/uL — ABNORMAL LOW (ref 4.22–5.81)
RDW: 19.7 % — ABNORMAL HIGH (ref 11.5–15.5)
WBC: 5.8 K/uL (ref 4.0–10.5)
nRBC: 0 % (ref 0.0–0.2)

## 2018-08-27 LAB — MAGNESIUM: Magnesium: 1.9 mg/dL (ref 1.7–2.4)

## 2018-08-27 LAB — PHOSPHORUS: Phosphorus: 3.9 mg/dL (ref 2.5–4.6)

## 2018-08-27 LAB — COMPREHENSIVE METABOLIC PANEL WITH GFR: AST: 13 U/L — ABNORMAL LOW (ref 15–41)

## 2018-08-27 LAB — HEPARIN LEVEL (UNFRACTIONATED): Heparin Unfractionated: 0.41 IU/mL (ref 0.30–0.70)

## 2018-08-27 MED ORDER — TRAVASOL 10 % IV SOLN
INTRAVENOUS | Status: DC
  Administered 2018-08-27: 18:00:00 via INTRAVENOUS
  Filled 2018-08-27: qty 600

## 2018-08-27 NOTE — Progress Notes (Signed)
PHARMACY - ADULT TOTAL PARENTERAL NUTRITION CONSULT NOTE   Pharmacy Consult for TPN Indication: bowel obstruction  Patient Measurements: Height: 5' 9"  (175.3 cm) Weight: 187 lb 9.8 oz (85.1 kg) IBW/kg (Calculated) : 70.7 TPN AdjBW (KG): 75.6 Body mass index is 27.71 kg/m. Usual Weight:   Insulin Requirements: on sensitive SSI q6h (used 6 units in the past 24 hrs)  Current Nutrition: TPN  IVF: LR at 100 mlhr  Central access: Port placed 03/27/18  TPN start date: 08/21/18  ASSESSMENT                                                                                                          HPI: Patient is a 62 y.o M with hx DVT, lymphoma and recurrent bowel obstruction, presented to the ED on 12/519 from PCP office for management of bowel obstruction.  TPN started on 12/9 to improve nutritional status for surgical interventions.  Significant events:  12/5: NGT; Abd CT showed sigmoid obstruction 12/8: flex sigmoidoscopy and colonoscopy (intrinsic narrowing of sigmoid colon) 12/9: started on TPN 12/13: loop ileostomy 12/14: start liquid diet 12/15: adv diet  Today:   Glucose (goal <150): all within goal range except one (175)  Electrolytes: K, phos, CorrCa wnl; Mag is at lower end of therapeutic range  Renal: scr stable  LFTs: AST low  TGs: 110 (12/10)  Prealbumin: 6.2 (12/8), <5 (12/10)  NUTRITIONAL GOALS                                                                                             RD recs (12/1): Kcal:2400-2600 kcal Protein:115-130 grams Fluid:>/= 2.4 L/day  Custom TPN at goal rate of 100 ml/hr provides: - 120 g/day protein ( 50  g/L) -  74 g/day Lipid  ( 31 g/L) - 384  g/day Dextrose ( 16 %) -  2530 Kcal/day  PLAN                                                                                                                          At 1800 today:  Per Dr. Excell Seltzer, plan for possible hospital discharge on 12/17, ok to wean TPN  today.  Will reduce TPN by half to 50  ml/hr. - add pepcid 40 mg to TPN  Electrolytes in TPN: Standard except continue K 40 meq/L, continue Ca  4 meq/L, increase Mag to 14 meq/L, continue phos 5 mmol/L; Cl:Ac ratio 1:1  Plan to advance as tolerated to the goal rate.  TPN to contain standard multivitamins and trace elements.  LR at 100 ml/hr per MD  Continue sensitive SSI q6h  TPN lab panels on Mondays & Thursdays.  F/u daily.  Isela Stantz P 08/27/2018,8:33 AM

## 2018-08-27 NOTE — Progress Notes (Signed)
Patient ID: Mark Frey, male   DOB: 07-Nov-1955, 62 y.o.   MRN: [id number] 2 Days Post-Op   Subjective: No complaints today.  Feels steadily better.  Tolerated full liquids.  Having some stool per ileostomy as well as a bowel movement.  No pain.  Objective: Vital signs in last 24 hours: Temp:  [97.5 F (36.4 C)-98.1 F (36.7 C)] 97.5 F (36.4 C) (12/15 0500) Pulse Rate:  [98-109] 98 (12/15 0500) Resp:  [15-18] 18 (12/15 0500) BP: (112-118)/(71-72) 112/72 (12/15 0500) SpO2:  [98 %-100 %] 99 % (12/15 0500) Weight:  [85.1 kg] 85.1 kg (12/15 0500) Last BM Date: 08/24/18  Intake/Output from previous day: 12/14 0701 - 12/15 0700 In: 1399.5 [I.V.:1399.5] Out: 925 [Urine:725; Stool:200] Intake/Output this shift: No intake/output data recorded.  General appearance: alert, cooperative and no distress GI: Soft and nontender, nondistended.  Ileostomy healthy. Incision/Wound: No erythema or drainage  Lab Results:  Recent Labs    08/26/18 0806 08/27/18 0641  WBC 5.9 5.8  HGB 9.7* 9.2*  HCT 32.2* 31.0*  PLT 153 149*   BMET Recent Labs    08/26/18 0806 08/27/18 0641  NA 135 135  K 5.1 4.5  CL 102 101  CO2 24 24  GLUCOSE 146* 163*  BUN 14 14  CREATININE 0.55* 0.57*  CALCIUM 9.4 9.3     Studies/Results: No results found.  Anti-infectives: Anti-infectives (From admission, onward)   Start     Dose/Rate Route Frequency Ordered Stop   08/25/18 0600  cefoTEtan (CEFOTAN) 2 g in sodium chloride 0.9 % 100 mL IVPB     2 g 200 mL/hr over 30 Minutes Intravenous On call to O.R. 08/24/18 0748 08/25/18 1040   08/18/18 1000  piperacillin-tazobactam (ZOSYN) IVPB 3.375 g  Status:  Discontinued     3.375 g 12.5 mL/hr over 240 Minutes Intravenous Every 8 hours 08/18/18 0858 08/22/18 1255   08/17/18 2300  piperacillin-tazobactam (ZOSYN) IVPB 3.375 g     3.375 g 100 mL/hr over 30 Minutes Intravenous  Once 08/17/18 2254 08/18/18 0203      Assessment/Plan: Status post  chemotherapy for extensive abdominal lymphoma presenting with 2 areas of obstruction of terminal ileum and sigmoid colon, possible fibrosis versus residual tumor. s/p Procedure(s): LAPAROSCOPY DIAGNOSTIC LAPROSCOPIC LOOP ILEOSTOMY Doing very well postoperatively.  Diet advanced.  Still needs to be seen by stomal therapy.  Anticipating discharge on 12/17    LOS: 10 days    Edward Jolly 08/27/2018

## 2018-08-27 NOTE — Progress Notes (Signed)
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   Assessment & Plan:   Principal Problem:   Bowel obstruction in setting of lymphoma and sigmoid stricture Active Problems:   Morbid obesity ([place])   Controlled diabetes mellitus type II without complication (HCC)   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[place])   Deep vein thrombosis (DVT) of femoral vein of right lower extremity (HCC)   Protein-calorie malnutrition, severe   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HCC)   Abnormal CT of the abdomen   #) Bowel obstruction status post laparoscopic loop ileostomy on 08/25/2018: Per discussion with general surgery patient has had multiple complex adhesions in the abdomen and pelvis.  Reportedly as these are active on PET scan there is a concern that there residual lymphoma versus possible desmoplastic response after cured lymphoma versus possible diverticulitis.  Reportedly on the diagnostic laparoscopy there was no evidence of diverticulitis noted. - Continue TPN until tolerating p.o. -General surgery following, appreciate recommendations -Tolerating full liquid diet, likely will advance today  #) Right lower extremity DVT: -Hold apixaban 5 mg twice daily, will restart on discharge -Continue heparin drip goal PTT is 60-90,   #) Type diabetes: -Hold sitagliptin and metformin -Sliding scale insulin, AC at bedtime  #) High-grade B-cell lymphoma: Unclear if this is in remission or not per above.  Will discuss concerned that there is no active diverticulitis noted. -Outpatient oncology follow-up  Fluids: TPN Electrolytes: TPN Nutrition:  TPN and clear liquid diet  Prophylaxis: Heparin drip  Disposition: Pending transitioning to orals and discontinuing TPN  Full code    Consultants:   General surgery  Oncology  Procedures:  None  Antimicrobials:  Perioperative  Subjective: This morning patient reports he is doing well.  He reports that he continues to have good output from his ostomy and his lower bowels.  He reports tolerating his full liquid diet well.  He otherwise denies any abdominal pain, nausea, vomiting, diarrhea, cough, congestion, rhinorrhea.  Objective: Vitals:   08/26/18 0630 08/26/18 1415 08/26/18 1900 08/27/18 0500  BP:  118/72 113/71 112/72  Pulse:  (!) 108 (!) 109 98  Resp:  15 16 18   Temp:  (!) 97.5 F (36.4 C) 98.1 F (36.7 C) (!) 97.5 F (36.4 C)  TempSrc:  Oral Oral Oral  SpO2: 98% 100% 98% 99%  Weight:    85.1 kg  Height:        Intake/Output Summary (Last 24 hours) at 08/27/2018 1155 Last data filed at 08/26/2018 1928 Gross per 24 hour  Intake 1399.46 ml  Output 375 ml  Net 1024.46 ml   Filed Weights   08/25/18 0619 08/26/18 0434 08/27/18 0500  Weight: 86.7 kg 89.5 kg 85.1 kg    Examination:  General exam: Appears calm and comfortable  Respiratory system: Clear to auscultation. Respiratory effort normal. Cardiovascular system: Regular rate and rhythm, no murmurs Gastrointestinal system: Soft, high-pitched bowel sounds throughout, ileostomy site is clean dry and intact Central nervous system: Alert and oriented.  Grossly intact, moving all extremities Extremities: Trace lower extremity edema. Skin: Port site is clean dry and intact Psychiatry: Judgement and insight appear normal. Mood & affect appropriate.  Data Reviewed: I have personally reviewed following labs and imaging studies  CBC: Recent Labs  Lab 08/21/18 0418 08/22/18 9485 08/23/18 0613 08/24/18 0607 08/25/18 0605 08/26/18 0806 08/27/18 0641  WBC 5.0 4.7 3.5* 4.6 4.5 5.9 5.8  NEUTROABS  2.7 2.2 1.4*  --   --   --   --   HGB 8.4* 8.2* 8.4* 8.1* 8.6* 9.7* 9.2*  HCT 28.0* 28.3* 28.6* 27.5* 28.6* 32.2* 31.0*  MCV 101.4* 102.9* 99.3 101.9* 99.0 100.0 101.3*  PLT 176 194 157 129* 146* 153 462*   Basic Metabolic Panel: Recent Labs  Lab 08/23/18 0613 08/24/18 0607 08/25/18 0605 08/26/18 0806 08/27/18 0641  NA 134* 136 135 135 135  K 4.3 5.0 4.8 5.1 4.5  CL 104 106 104 102 101  CO2 23 25 22 24 24   GLUCOSE 146* 121* 131* 146* 163*  BUN 8 9 12 14 14   CREATININE 0.46* 0.48* 0.45* 0.55* 0.57*  CALCIUM 7.9* 8.4* 8.6* 9.4 9.3  MG 1.8 2.0 1.9 2.1 1.9  PHOS 2.6 4.1 4.7* 3.8 3.9   GFR: Estimated Creatinine Clearance: 103.6 mL/min (A) (by C-G formula based on SCr of 0.57 mg/dL (L)). Liver Function Tests: Recent Labs  Lab 08/22/18 0633 08/24/18 0607 08/26/18 0806 08/27/18 0641  AST 8* 9* 10* 13*  ALT 8 6 9 12   ALKPHOS 39 37* 44 50  BILITOT 0.4 0.6 0.5 0.5  PROT 4.5* 4.9* 6.2* 5.9*  ALBUMIN 2.2* 2.2* 3.3* 3.1*   No results for input(s): LIPASE, AMYLASE in the last 168 hours. No results for input(s): AMMONIA in the last 168 hours. Coagulation Profile: No results for input(s): INR, PROTIME in the last 168 hours. Cardiac Enzymes: No results for input(s): CKTOTAL, CKMB, CKMBINDEX, TROPONINI in the last 168 hours. BNP (last 3 results) No results for input(s): PROBNP in the last 8760 hours. HbA1C: No results for input(s): HGBA1C in the last 72 hours. CBG: Recent Labs  Lab 08/26/18 0621 08/26/18 1200 08/26/18 1726 08/26/18 2330 08/27/18 0619  GLUCAP 175* 168* 140* 146* 155*   Lipid Profile: No results for input(s): CHOL, HDL, LDLCALC, TRIG, CHOLHDL, LDLDIRECT in the last 72 hours. Thyroid Function Tests: No results for input(s): TSH, T4TOTAL, FREET4, T3FREE, THYROIDAB in the last 72 hours. Anemia Panel: No results for input(s): VITAMINB12, FOLATE, FERRITIN, TIBC, IRON, RETICCTPCT in the last 72 hours. Sepsis Labs: No results for input(s): PROCALCITON,  LATICACIDVEN in the last 168 hours.  Recent Results (from the past 240 hour(s))  MRSA PCR Screening     Status: None   Collection Time: 08/24/18 11:26 AM  Result Value Ref Range Status   MRSA by PCR NEGATIVE NEGATIVE Final    Comment:        The GeneXpert MRSA Assay (FDA approved for NASAL specimens only), is one component of a comprehensive MRSA colonization surveillance program. It is not intended to diagnose MRSA infection nor to guide or monitor treatment for MRSA infections. Performed at [facility], [place] [address], [place], [place] [address]          Radiology Studies: No results found.      Scheduled Meds: . acetaminophen  650 mg Oral Q6H  . insulin aspart  0-9 Units Subcutaneous Q6H   Continuous Infusions: . heparin 2,150 Units/hr (08/27/18 1138)  . TPN ADULT (ION) 100 mL/hr at 08/26/18 1742  . TPN ADULT (ION)       LOS: 10 days    Time spent: 35    Raymar Joiner C  Roiza Wiedel, MD [facility]  If 7PM-7AM, please contact night-coverage [email or web address] Password [email or web address] 08/27/2018, 11:55 AM

## 2018-08-27 NOTE — Progress Notes (Signed)
ANTICOAGULATION CONSULT NOTE - Follow Up Consult  Pharmacy Consult for heparin Indication: hx pulmonary embolus and DVT (home Eliquis on hold)  Allergies  Allergen Reactions  . Ciprofloxacin Other (See Comments)    Leg tingling    Patient Measurements: Height: 5' 9"  (175.3 cm) Weight: 187 lb 9.8 oz (85.1 kg) IBW/kg (Calculated) : 70.7 Heparin Dosing Weight: 89 kg  Vital Signs: Temp: 97.5 F (36.4 C) (12/15 0500) Temp Source: Oral (12/15 0500) BP: 112/72 (12/15 0500) Pulse Rate: 98 (12/15 0500)  Labs: Recent Labs    08/24/18 1152  08/25/18 0605 08/26/18 0806 08/27/18 0641  HGB  --    < > 8.6* 9.7* 9.2*  HCT  --   --  28.6* 32.2* 31.0*  PLT  --   --  146* 153 149*  HEPARINUNFRC 0.32  --   --  0.42 0.41  CREATININE  --   --  0.45* 0.55* 0.57*   < > = values in this interval not displayed.    Estimated Creatinine Clearance: 103.6 mL/min (A) (by C-G formula based on SCr of 0.57 mg/dL (L)).   Medications:  - on Eliquis 5 mg bid PTA (last dose taken on 12/4 at 1800)  Assessment: Patient's a 62 y.o M with hx high-grade B-cell lymphoma with intra-abdominal mass encasing the small bowel and PE/DVT on Eliquis PTA, presented to the ED on 12/5 with c/o constipation and abd distention.  Abd CT on 12/5 showed bowel obstruction. Patient was transitioned from Eliquis to heparin drip on admission since patient is NPO and in case surgical intervention is needed.  Significant events: 12/5: NGT; Abd CT showed sigmoid obstruction 12/8: flex sigmoidoscopy and colonoscopy (intrinsic narrowing of sigmoid colon) 12/13: loop ileostomy; heparin drip held at 0400 for procedure and restarted back at 2300  Today, 08/27/2018: - heparin level remains therapeutic at 0.41 - cbc relatively stable - no bleeding documented  Goal of Therapy:  Heparin level 0.3-0.7 units/ml Monitor platelets by anticoagulation protocol: Yes   Plan:  - continue heparin drip at 2150 units/hr - daily heparin  level - monitor for s/s bleeding  Elmer Merwin P 08/27/2018,8:32 AM

## 2018-08-28 DIAGNOSIS — E119 Type 2 diabetes mellitus without complications: Secondary | ICD-10-CM

## 2018-08-28 LAB — DIFFERENTIAL
Abs Immature Granulocytes: 0.08 10*3/uL — ABNORMAL HIGH (ref 0.00–0.07)
Basophils Absolute: 0 10*3/uL (ref 0.0–0.1)
Basophils Relative: 1 %
Eosinophils Absolute: 0.1 10*3/uL (ref 0.0–0.5)
Eosinophils Relative: 3 %
Immature Granulocytes: 2 %
Lymphocytes Relative: 39 %
Lymphs Abs: 1.7 10*3/uL (ref 0.7–4.0)
MONO ABS: 0.6 10*3/uL (ref 0.1–1.0)
MONOS PCT: 13 %
Neutro Abs: 1.9 10*3/uL (ref 1.7–7.7)
Neutrophils Relative %: 42 %

## 2018-08-28 LAB — COMPREHENSIVE METABOLIC PANEL
ALT: 24 U/L (ref 0–44)
AST: 23 U/L (ref 15–41)
Albumin: 3.1 g/dL — ABNORMAL LOW (ref 3.5–5.0)
Alkaline Phosphatase: 65 U/L (ref 38–126)
Anion gap: 9 (ref 5–15)
BILIRUBIN TOTAL: 0.4 mg/dL (ref 0.3–1.2)
BUN: 16 mg/dL (ref 8–23)
CALCIUM: 9.4 mg/dL (ref 8.9–10.3)
CO2: 24 mmol/L (ref 22–32)
Chloride: 103 mmol/L (ref 98–111)
Creatinine, Ser: 0.57 mg/dL — ABNORMAL LOW (ref 0.61–1.24)
GFR calc Af Amer: >60 mL/min (ref >60)
GFR calc non Af Amer: >60 mL/min (ref >60)
Glucose, Bld: 161 mg/dL — ABNORMAL HIGH (ref 70–99)
Potassium: 4.7 mmol/L (ref 3.5–5.1)
Sodium: 136 mmol/L (ref 135–145)
Total Protein: 5.9 g/dL — ABNORMAL LOW (ref 6.5–8.1)

## 2018-08-28 LAB — HEPARIN LEVEL (UNFRACTIONATED): Heparin Unfractionated: <0.1 IU/mL — ABNORMAL LOW (ref 0.30–0.70)

## 2018-08-28 LAB — CBC
HCT: 29.9 % — ABNORMAL LOW (ref 39.0–52.0)
Hemoglobin: 9.1 g/dL — ABNORMAL LOW (ref 13.0–17.0)
MCH: 30.7 pg (ref 26.0–34.0)
MCHC: 30.4 g/dL (ref 30.0–36.0)
MCV: 101 fL — ABNORMAL HIGH (ref 80.0–100.0)
PLATELETS: 151 10*3/uL (ref 150–400)
RBC: 2.96 MIL/uL — ABNORMAL LOW (ref 4.22–5.81)
RDW: 19.8 % — ABNORMAL HIGH (ref 11.5–15.5)
WBC: 4.5 10*3/uL (ref 4.0–10.5)
nRBC: 0 % (ref 0.0–0.2)

## 2018-08-28 LAB — PHOSPHORUS: Phosphorus: 4.8 mg/dL — ABNORMAL HIGH (ref 2.5–4.6)

## 2018-08-28 LAB — GLUCOSE, CAPILLARY
Glucose-Capillary: 138 mg/dL — ABNORMAL HIGH (ref 70–99)
Glucose-Capillary: 166 mg/dL — ABNORMAL HIGH (ref 70–99)

## 2018-08-28 LAB — PREALBUMIN: Prealbumin: 16.4 mg/dL — ABNORMAL LOW (ref 18–38)

## 2018-08-28 LAB — TRIGLYCERIDES: Triglycerides: 149 mg/dL (ref <150)

## 2018-08-28 LAB — MAGNESIUM: MAGNESIUM: 1.8 mg/dL (ref 1.7–2.4)

## 2018-08-28 MED ORDER — ACETAMINOPHEN 325 MG PO TABS: 650.0000 mg | ORAL_TABLET | Freq: Four times a day (QID) | ORAL | Status: DC | PRN

## 2018-08-28 MED ORDER — APIXABAN 5 MG PO TABS
5.0000 mg | ORAL_TABLET | Freq: Two times a day (BID) | ORAL | Status: DC
  Administered 2018-08-28: 5 mg via ORAL
  Filled 2018-08-28: qty 1

## 2018-08-28 MED ORDER — HEPARIN SOD (PORK) LOCK FLUSH 100 UNIT/ML IV SOLN
500.0000 [IU] | INTRAVENOUS | Status: AC | PRN
  Administered 2018-08-28: 500 [IU]

## 2018-08-28 MED ORDER — TRAVASOL 10 % IV SOLN
INTRAVENOUS | Status: AC
  Filled 2018-08-28: qty 300

## 2018-08-28 NOTE — Progress Notes (Signed)
Pt. Refused insuline despite education.

## 2018-08-28 NOTE — Consult Note (Signed)
[facility] Nurse ostomy consult note Stoma type/location:  Stomal assessment/size:  Peristomal assessment:  Support rod in place, CCS requested to have this removed Wednesday or Thursday of this week. Verified with [facility] WOC nurse, patient will need office appointment for this.  Treatment options for stomal/peristomal skin:  Output  Ostomy pouching: 1pc./2pc.  Education provided:  Enrolled patient in [place] program: Yes  [facility] Nurse will follow along with you for continued support with ostomy teaching and care [place] MSN, [place], [place], [place], [place]

## 2018-08-28 NOTE — Progress Notes (Signed)
[name] to be D/C'd Home per MD order.  Discussed prescriptions and follow up appointments with the patient. Prescriptions given to patient, medication list explained in detail. Pt verbalized understanding.  Allergies as of 08/28/2018      Reactions   Ciprofloxacin Other (See Comments)   Leg tingling      Medication List    TAKE these medications   acetaminophen 325 MG tablet Commonly known as:  TYLENOL Take 2 tablets (650 mg total) by mouth every 6 (six) hours as needed.   apixaban 5 MG Tabs tablet Commonly known as:  ELIQUIS Take 1 tablet (5 mg total) by mouth 2 (two) times daily.   blood glucose meter kit and supplies Dispense based on patient and insurance preference. Use daily as directed. (E11.9).   glucose blood test strip Commonly known as:  FREESTYLE TEST STRIPS Use to check blood sugar daily   JANUMET 50-1000 MG tablet Generic drug:  sitaGLIPtin-metformin TAKE 1 TABLET TWICE DAILY  WITH MEALS   lidocaine-prilocaine cream Commonly known as:  EMLA Apply 1 application topically as needed. What changed:    when to take this  reasons to take this   Magnesium 500 MG Caps Take 1 capsule (500 mg total) by mouth 2 (two) times daily.   PROBIOTIC-10 Caps Take 1 capsule by mouth daily.       Vitals:   08/27/18 2015 08/28/18 0427  BP: 111/78 111/78  Pulse: 95 68  Resp: 18 18  Temp: (!) 97.4 F (36.3 C) 97.8 F (36.6 C)  SpO2: 98% 99%    Skin clean, dry and intact without evidence of skin break down, no evidence of skin tears noted. IV catheter discontinued intact. Site without signs and symptoms of complications. Dressing and pressure applied. Pt denies pain at this time. No complaints noted.  An After Visit Summary was printed and given to the patient. Patient escorted via [place], and D/C home via private auto.  Nonie Hoyer S 08/28/2018 2:23 PM

## 2018-08-28 NOTE — Progress Notes (Signed)
[facility] Progress Note  3 Days Post-Op  Subjective: CC: No complaints Tolerating soft diet with good stool output. Pain well controlled. Hopeful to stop TPN today. Hoping to potentially go home today.   Objective: Vital signs in last 24 hours: Temp:  [97.4 F (36.3 C)-97.8 F (36.6 C)] 97.8 F (36.6 C) (12/16 0427) Pulse Rate:  [68-108] 68 (12/16 0427) Resp:  [16-18] 18 (12/16 0427) BP: (106-111)/(76-78) 111/78 (12/16 0427) SpO2:  [98 %-99 %] 99 % (12/16 0427) Weight:  [83 kg] 83 kg (12/16 0500) Last BM Date: 08/27/18  Intake/Output from previous day: 12/15 0701 - 12/16 0700 In: 0  Out: 1625 [Urine:450; Stool:1175] Intake/Output this shift: No intake/output data recorded.  PE: Gen:  Alert, NAD, pleasant Card:  Regular rate and rhythm Pulm:  Normal effort, clear to auscultation bilaterally Abd: Soft, non-tender, non-distended, bowel sounds present, incisions C/D/I, stoma red with stool in ostomy Skin: warm and dry, no rashes  Psych: A&Ox3   Lab Results:  Recent Labs    08/27/18 0641 08/28/18 0600  WBC 5.8 4.5  HGB 9.2* 9.1*  HCT 31.0* 29.9*  PLT 149* 151   BMET Recent Labs    08/27/18 0641 08/28/18 0600  NA 135 136  K 4.5 4.7  CL 101 103  CO2 24 24  GLUCOSE 163* 161*  BUN 14 16  CREATININE 0.57* 0.57*  CALCIUM 9.3 9.4   PT/INR No results for input(s): LABPROT, INR in the last 72 hours. CMP     Component Value Date/Time   NA 136 08/28/2018 0600   K 4.7 08/28/2018 0600   CL 103 08/28/2018 0600   CO2 24 08/28/2018 0600   GLUCOSE 161 (H) 08/28/2018 0600   BUN 16 08/28/2018 0600   CREATININE 0.57 (L) 08/28/2018 0600   CREATININE 0.60 (L) 08/11/2018 1210   CREATININE 1.26 12/01/2012 1700   CALCIUM 9.4 08/28/2018 0600   PROT 5.9 (L) 08/28/2018 0600   ALBUMIN 3.1 (L) 08/28/2018 0600   AST 23 08/28/2018 0600   AST 20 08/11/2018 1210   ALT 24 08/28/2018 0600   ALT 16 08/11/2018 1210   ALKPHOS 65 08/28/2018 0600   BILITOT 0.4  08/28/2018 0600   BILITOT 0.4 08/11/2018 1210   GFRNONAA >60 08/28/2018 0600   GFRNONAA >60 08/11/2018 1210   GFRAA >60 08/28/2018 0600   GFRAA >60 08/11/2018 1210   Lipase     Component Value Date/Time   LIPASE 19 08/17/2018 1738       Studies/Results: No results found.  Anti-infectives: Anti-infectives (From admission, onward)   Start     Dose/Rate Route Frequency Ordered Stop   08/25/18 0600  cefoTEtan (CEFOTAN) 2 g in sodium chloride 0.9 % 100 mL IVPB     2 g 200 mL/hr over 30 Minutes Intravenous On call to O.R. 08/24/18 0748 08/25/18 1040   08/18/18 1000  piperacillin-tazobactam (ZOSYN) IVPB 3.375 g  Status:  Discontinued     3.375 g 12.5 mL/hr over 240 Minutes Intravenous Every 8 hours 08/18/18 0858 08/22/18 1255   08/17/18 2300  piperacillin-tazobactam (ZOSYN) IVPB 3.375 g     3.375 g 100 mL/hr over 30 Minutes Intravenous  Once 08/17/18 2254 08/18/18 0203       Assessment/Plan Intraabdominal B cell lymphoma EPOCH chemotherapy - [name] Recurrent bowel obstruction x 3,with sigmoid segmental thickening that has developed into stenosis,&dilated appendix; lymphoma VS diverticulitis VS appendicitis. DVT/bilateral pulmonary emboli;on anticoagulation last dose 08/16/2018 -resume eliquis today Type 2 diabetes Hypertension SPCM -  d/c TPN, prealbumin 16  Recurrent small bowel obstruction/sigmoid stenosis -s/p laparoscopic loop ileostomy 12/13 - POD#3 - having good stomal output and tol soft - d/c TPN and start reg diet - WOC to see today - HH RN orders placed, will arrange follow up - stable for discharge later this afternoon if everything set up and tolerating reg diet    LOS: 11 days    Brigid Re , [facility] Surgery 08/28/2018, 9:15 AM Pager: [phone] Consults: [phone] Mon-Fri 7:00 am-4:30 pm Sat-Sun 7:00 am-11:30 am

## 2018-08-28 NOTE — Discharge Summary (Signed)
Physician Discharge Summary  Mark Frey OHY:[id number] DOB: [birth year] DOA: 08/17/2018  PCP: [name], MD  Admit date: 08/17/2018 Discharge date: 08/28/2018  Time spent: >35 minutes  Recommendations for Outpatient Follow-up:  PCP in 3-7 days as needed [facility] in 10 day  Discharge Diagnoses:  Principal Problem:   Bowel obstruction in setting of lymphoma and sigmoid stricture Active Problems:   Morbid obesity ([place])   Controlled diabetes mellitus type II without complication (HCC)   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HCC)   Deep vein thrombosis (DVT) of femoral vein of right lower extremity (HCC)   Protein-calorie malnutrition, severe   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HCC)   Abnormal CT of the abdomen   Discharge Condition: stable   Diet recommendation: carb modified   Filed Weights   08/26/18 0434 08/27/18 0500 08/28/18 0500  Weight: 89.5 kg 85.1 kg 83 kg    History of present illness:   62 year old with past medical history relevant for high-grade B-cell lymphoma currently thought to be in remission, right lower extremity DVT on apixaban, type 2 diabetes on oral hypoglycemics who presents with nausea, vomiting, abdominal pain thought to be secondary to sigmoid colonic obstruction.  Hospital Course:   Bowel obstruction. Underwent  laparoscopic loop ileostomy on 08/25/2018: Per discussion with general surgery patient has had multiple complex adhesions in the abdomen and pelvis.  Reportedly as these are active on PET scan there is a concern that there residual lymphoma versus possible desmoplastic response after cured lymphoma versus possible diverticulitis.  Reportedly on the diagnostic laparoscopy there was no evidence of diverticulitis noted. -patient required short time TPN, then transitioned to PO intake. Cont outpatient [place], surgery f/u in 10-14 day s  History Right lower extremity DVT: cont apixaban 5 mg twice daily  Type diabetes:resume sitagliptin and  metformin  High-grade B-cell lymphoma: Cont Outpatient oncology follow-up  Procedures:  laparoscopic loop ileostomy on 08/25/2018: (i.e. Studies not automatically included, echos, thoracentesis, etc; not x-rays)  Consultations:  Surgery   Discharge Exam: Vitals:   08/27/18 2015 08/28/18 0427  BP: 111/78 111/78  Pulse: 95 68  Resp: 18 18  Temp: (!) 97.4 F (36.3 C) 97.8 F (36.6 C)  SpO2: 98% 99%    General: no distress  Cardiovascular: s1,s2 rrr Respiratory: CTA BL  Discharge Instructions  Discharge Instructions    Diet - low sodium heart healthy   Complete by:  As directed    Increase activity slowly   Complete by:  As directed      Allergies as of 08/28/2018      Reactions   Ciprofloxacin Other (See Comments)   Leg tingling      Medication List    TAKE these medications   acetaminophen 325 MG tablet Commonly known as:  TYLENOL Take 2 tablets (650 mg total) by mouth every 6 (six) hours as needed.   apixaban 5 MG Tabs tablet Commonly known as:  ELIQUIS Take 1 tablet (5 mg total) by mouth 2 (two) times daily.   blood glucose meter kit and supplies Dispense based on patient and insurance preference. Use daily as directed. (E11.9).   glucose blood test strip Commonly known as:  FREESTYLE TEST STRIPS Use to check blood sugar daily   JANUMET 50-1000 MG tablet Generic drug:  sitaGLIPtin-metformin TAKE 1 TABLET TWICE DAILY  WITH MEALS   lidocaine-prilocaine cream Commonly known as:  EMLA Apply 1 application topically as needed. What changed:    when to take this  reasons to take  this   Magnesium 500 MG Caps Take 1 capsule (500 mg total) by mouth 2 (two) times daily.   PROBIOTIC-10 Caps Take 1 capsule by mouth daily.      Allergies  Allergen Reactions  . Ciprofloxacin Other (See Comments)    Leg tingling   Follow-up Information    Surgery, [facility] [place]. Go on 09/27/2018.   Specialty:  General Surgery Why:  Appointment scheduled for  11:30 AM. Please arrive 30 min prior to appointment time. Bring photo ID and insurance information with you.  Contact information: [address] [address] [phone]            The results of significant diagnostics from this hospitalization (including imaging, microbiology, ancillary and laboratory) are listed below for reference.    Significant Diagnostic Studies: Dg Abd 1 View  Result Date: 08/19/2018 CLINICAL DATA:  62 year old male with bowel obstruction. EXAM: ABDOMEN - 1 VIEW COMPARISON:  Abdominal radiograph dated 08/17/2018 and CT dated 08/17/2018 FINDINGS: There is persistent diffuse dilatation of air-filled loops of small bowel measuring up to 6 cm in the left lower quadrant. No free air. The osseous structures and soft tissues are grossly unremarkable. IMPRESSION: Persistent small bowel dilatation.  Continued follow-up recommended. Electronically Signed   By: Anner Crete M.D.   On: 08/19/2018 05:40   Dg Abd 1 View  Result Date: 08/17/2018 CLINICAL DATA:  62 year old presenting with abdominal distention and absence of bowel movements over the past 5 days. Patient was recently released from the hospital after having a partial small bowel obstruction. Personal history of lymphoma. EXAM: ABDOMEN - 1 VIEW COMPARISON:  08/11/2018, 08/01/2018 and earlier. Chest x-ray 07/30/2018. FINDINGS: AP ERECT image was obtained. Multiple markedly distended loops of small bowel throughout the upper abdomen demonstrating air-fluid levels. Distal small bowel of normal caliber. Normal caliber colon with moderate stool burden in the ascending colon. No evidence of free intraperitoneal air. Linear scarring in the visualized lung bases, unchanged. IMPRESSION: 1. Recurrent partial small bowel obstruction. 2. No free intraperitoneal air. These results will be called to the ordering clinician or representative by the Radiologist Assistant, and communication documented in the PACS or  zVision Dashboard. Electronically Signed   By: Evangeline Dakin M.D.   On: 08/17/2018 15:19   Dg Abd 1 View  Result Date: 08/01/2018 CLINICAL DATA:  Small-bowel obstruction. EXAM: ABDOMEN - 1 VIEW COMPARISON:  07/31/2018.  CT 07/30/2018. FINDINGS: Small amount of oral contrast noted in duodenum, proximal small bowel, this is progressed from the stomach from prior day's exam. This is progressed from prior exam of 07/31/2018. Air-fluid levels are noted throughout small and large bowel. Again these findings are consistent with possible small and large bowel obstruction as noted on recent CT. No free air is identified. No acute bony abnormality. IMPRESSION: 1. Progression of oral contrast into duodenum/proximal small bowel from prior day's exam. 2. Persistent dilated small and large bowel. Again these findings are consistent with possible small and large bowel obstruction is noted on recent CT. No free air. Electronically Signed   By: Marcello Moores  Register   On: 08/01/2018 13:01   Dg Abdomen 1 View  Result Date: 07/30/2018 CLINICAL DATA:  NG tube placement EXAM: ABDOMEN - 1 VIEW COMPARISON:  CT 07/30/2018 FINDINGS: The chest was included due to the NG tube not being present in the abdomen. The NG tube coils in the upper chest near the thoracic inlet. Right Port-A-Cath in place with the tip at the cavoatrial junction.  Diffuse gaseous distention of bowel. IMPRESSION: NG tube coils in the upper chest near the thoracic inlet. The tip is not visualized. Electronically Signed   By: Rolm Baptise M.D.   On: 07/30/2018 20:52   Ct Abdomen Pelvis W Contrast  Result Date: 08/17/2018 CLINICAL DATA:  62 year old male with concern for bowel obstruction. History of Burkitt's lymphoma and chemotherapy. EXAM: CT ABDOMEN AND PELVIS WITH CONTRAST TECHNIQUE: Multidetector CT imaging of the abdomen and pelvis was performed using the standard protocol following bolus administration of intravenous contrast. CONTRAST:  157m ISOVUE-300  IOPAMIDOL (ISOVUE-300) INJECTION 61%, 312mOMNIPAQUE IOHEXOL 300 MG/ML SOLN COMPARISON:  CT of the abdomen pelvis dated 07/30/2018 and abdominal radiograph dated 08/17/2018 FINDINGS: Lower chest: There are bibasilar linear atelectasis/scarring. The tip of a central venous catheter is partially visualized at the cavoatrial junction. No intra-abdominal free air.  Trace fluid adjacent to the liver. Hepatobiliary: Subcentimeter hypodense focus in the dome of the liver is too small to characterize. The liver is otherwise unremarkable. No intrahepatic biliary ductal dilatation. There is a focal area of thickening at the gallbladder fundus most consistent with adenomyomatosis. This was present on the prior studies and was not metabolically active on the PET. No calcified gallstone. Pancreas: Unremarkable. No pancreatic ductal dilatation or surrounding inflammatory changes. Spleen: Normal in size without focal abnormality. Adrenals/Urinary Tract: The adrenal glands are unremarkable. There is a 5 mm nonobstructing calculus in the interpolar aspect of the right kidney. No hydronephrosis. Additional smaller nonobstructing stone noted in the superior pole of the right kidney. Subcentimeter bilateral renal hypodense lesions are not characterized but likely represent cysts. There is no hydronephrosis on either side. There is symmetric enhancement and excretion of contrast by both kidneys. The urinary bladder is grossly unremarkable. Stomach/Bowel: There is segmental thickening of the sigmoid colon involving approximately 8 cm length. There is associated luminal narrowing and stricture of the sigmoid colon with moderate amount of retained stool within the colon proximal to this segment with an appearance of a moderate to high-grade obstruction. The thickened segment of the sigmoid colon may be related to post treatment changes and resulting fibrosis although involvement with a neoplastic process is not excluded. This is however  somewhat new since PET-CT of 06/09/2018. There is stranding of the fat in the pelvis adjacent to the sigmoid colon with tethering of the terminal ileum and appendix consistent with adhesions and scarring. There may be a degree of low-grade or partial small-bowel obstruction at the focus of tethering of the terminal ileum. There is fecalization of the terminal ileum. However, fecal content passes through the colon. The small bowel are dilated measuring up to 6 cm. The appendix is thickened and dilated with fecal content. It measures up to 2.5 cm in diameter. Multiple appendicular with noted in the distal appendix. The changes of the appendix are likely secondary to post radiation treatment. However, acute appendicitis is not entirely excluded. Clinical correlation is recommended. Vascular/Lymphatic: The abdominal aorta and IVC are grossly unremarkable. No portal venous gas. No adenopathy. Reproductive: The prostate and seminal vesicles are grossly unremarkable. Other: None Musculoskeletal: Degenerative changes of the spine. No acute osseous pathology. IMPRESSION: 1. Segmental thickening of the sigmoid colon with associated luminal narrowing and stricture and resulting obstruction at the level of the sigmoid colon. Findings likely related to post treatment changes although a colonic mass is not entirely excluded. 2. Diffusely dilated small bowel may represent an ileus or a degree of small-bowel obstruction at the level of the terminal ileum  secondary to adhesions. Stool however passes into the colon. A small-bowel series may provide better evaluation of the degree of potential obstruction in the terminal ileum. 3. Dilated and thickened appendix, likely related to post treatment changes. Acute appendicitis is not entirely excluded. Clinical correlation is recommended. Electronically Signed   By: Anner Crete M.D.   On: 08/17/2018 21:43   Ct Abdomen Pelvis W Contrast  Result Date: 07/30/2018 CLINICAL DATA:   Abdominal distention and vomiting. Finished chemotherapy for Burkitt's lymphoma. Recent small bowel obstruction. EXAM: CT ABDOMEN AND PELVIS WITH CONTRAST TECHNIQUE: Multidetector CT imaging of the abdomen and pelvis was performed using the standard protocol following bolus administration of intravenous contrast. CONTRAST:  162m ISOVUE-300 IOPAMIDOL (ISOVUE-300) INJECTION 61% COMPARISON:  PET-CT dated 07/28/2018. Pelvis CT dated 07/21/2018. Abdomen CT dated 07/21/2018. FINDINGS: Lower chest: Bilateral lower lobe atelectasis. Hepatobiliary: Small cyst in the dome of the liver on the right. Additional tiny right lobe liver cyst more inferiorly. Small gallbladder Phrygian cap. Pancreas: Unremarkable. No pancreatic ductal dilatation or surrounding inflammatory changes. Spleen: Normal in size without focal abnormality. Adrenals/Urinary Tract: Normal appearing adrenal glands. Small bilateral renal cysts. 7 mm lower pole right renal calculus. No bladder or ureteral calculi and no hydronephrosis. Stomach/Bowel: Multiple significantly dilated, fluid and gas-filled loops of small bowel to the level of the terminal ileum with mildly dilated colon filled with fluid and gas, to the level of the previously demonstrated 7 cm segment of sigmoid colon with diffuse wall thickening and mild pericolonic soft tissue stranding. A diverticulum is demonstrated at the distal aspect of this abnormality. Also demonstrated is a dilated appendix containing multiple appendicoliths measuring up to 8 mm in maximum diameter each. The appendix measures 14 mm in maximum diameter. There is periappendiceal and pericolonic soft tissue stranding in that region. Vascular/Lymphatic: No significant vascular findings are present. No enlarged abdominal or pelvic lymph nodes. Reproductive: Minimally enlarged prostate gland containing coarse calcifications. Other: Small bilateral inguinal hernias containing fat. Musculoskeletal: Lumbar and lower thoracic spine  degenerative changes. IMPRESSION: 1. Recurrent or persistent distal small bowel obstruction to the level of soft tissue stranding in the central pelvis in an area where the appendix is dilated, containing multiple appendicoliths and where there is a 7 cm long segment of previously demonstrated sigmoid colon thickening and mild pericolonic soft tissue stranding. There is mild colonic dilatation compatible with partial obstruction to the level of the thickened sigmoid colon. There is also mild periappendiceal soft tissue stranding. This could all be due to post treatment changes, appendicitis, sigmoid diverticulitis or a combination of those causes. As previously mentioned, the sigmoid colon wall thickening could also be due to malignancy. 2. 7 mm nonobstructing lower pole right renal calculus. Electronically Signed   By: SClaudie ReveringM.D.   On: 07/30/2018 18:47   Ct Entero Abd/pelvis W Contast  Result Date: 08/23/2018 CLINICAL DATA:  62year old male with history of potential low-grade bowel obstruction. Follow-up study. EXAM: CT ABDOMEN AND PELVIS WITH CONTRAST (ENTEROGRAPHY) TECHNIQUE: Multidetector CT of the abdomen and pelvis during bolus administration of intravenous contrast. Negative oral contrast was given. CONTRAST:  108mISOVUE-300 IOPAMIDOL (ISOVUE-300) INJECTION 61% COMPARISON:  CT the abdomen and pelvis 08/17/2018. FINDINGS: Lower chest: Areas of scarring are noted throughout the lung bases bilaterally. Nasogastric tube in the distal esophagus. Central venous catheter tip extending into the superior aspect of the right atrium. Hepatobiliary: There are 2 subcentimeter low-attenuation lesions in the right lobe of the liver, too small to characterize, but statistically likely  to represent tiny cysts. No other larger more suspicious appearing cystic or solid hepatic lesions. No intra or extrahepatic biliary ductal dilatation. Gallbladder is unremarkable in appearance. Pancreas: No pancreatic mass. No  pancreatic ductal dilatation. No pancreatic or peripancreatic fluid or inflammatory changes. Spleen: Unremarkable. Adrenals/Urinary Tract: 2 nonobstructive calculi are noted within the right renal collecting system, largest of which is in the lower pole measuring 6 mm. In addition, in the distal third of the right ureter immediately before the right ureterovesicular junction there is a 3 mm calculus (axial image 76 of series 3). This is not associated with significant proximal hydroureteronephrosis to indicate urinary tract obstruction at this time. Several subcentimeter low-attenuation lesions in both kidneys are too small to definitively characterize, but are favored to represent tiny cysts. Bilateral adrenal glands are normal in appearance. Urinary bladder is normal in appearance. Stomach/Bowel: Tip of nasogastric tube terminates in the proximal stomach. As noted on the prior examination, there continues to be some mass-like mural thickening in the sigmoid colon estimated to measure approximately 8.7 x 4.7 x 4.4 cm (axial image 66 of series 3 and coronal image 44 of series 7), where there is also associated marked luminal narrowing. Proximal to this, there is moderate distension of the colon with several air-fluid levels. Multiple prominent borderline dilated loops of small bowel are noted measuring up to 2.8 cm in diameter, with several small air-fluid levels. These findings suggest very mild partial obstruction of the distal colon. In addition, there continues to be severe thickening of the wall of the terminal ileum with surrounding inflammatory changes. Notably, there is some unusual architectural distortion and soft tissue thickening extending from the anterior aspect of the distal ileum toward the anterior abdominal wall best appreciated on axial image 65 of series 3, potentially a developing fistula (this does not appear to extend beyond the peritoneal cavity at this time). In addition, the appendix  remains dilated up to 20 mm in diameter, contain several appendicoliths, and continues to have some surrounding inflammatory changes in the periappendiceal fat, however, this appearance is relatively stable compared to the recent prior examinations. Inflammatory changes are also noted in the sigmoid mesocolon, and there is a small amount of interloop fluid in the right lower quadrant. Vascular/Lymphatic: Aortic atherosclerosis, without evidence of aneurysm or dissection in the abdominal or pelvic vasculature. No lymphadenopathy noted in the abdomen or pelvis. Reproductive: Prostate gland and seminal vesicles are unremarkable in appearance. Other: Trace volume.  Of ascites.  No pneumoperitoneum Musculoskeletal: There are no aggressive appearing lytic or blastic lesions noted in the visualized portions of the skeleton. IMPRESSION: 1. Persistent mass-like mural thickening of the sigmoid colon which appears to cause mild partial obstruction. 2. Inflammatory changes throughout the soft tissues of the low anatomic pelvis involving the sigmoid mesocolon, the mesenteric fat adjacent to the distal/terminal ileum (which also demonstrates mural thickening), the as well as the periappendiceal fat adjacent to the appendix which remains dilated, thickened and inflamed with internal appendicoliths. Although these findings would typically be indicative of an acute process, the appearance is relatively stable compared to recent prior examinations, suggesting that some of this is post treatment related. There does appear to be extensive scarring in this region, including a potential fistulous tract developing from the anterior aspect of the terminal ileum (discussed above), which remains confined to the peritoneal cavity at this time. 3. Nonobstructive calculi in the collecting system of the right kidney measuring up to 6 mm in the lower pole. In addition,  there is a 3 mm calculus in the distal third of the right ureter immediately  before the right ureterovesicular junction. No proximal hydroureteronephrosis to indicate associated urinary tract obstruction at this time. 4. Aortic atherosclerosis. 5. Additional incidental findings, as above. Electronically Signed   By: Vinnie Langton M.D.   On: 08/23/2018 08:16   Dg Chest Port 1 View  Result Date: 08/24/2018 CLINICAL DATA:  Preoperative evaluation EXAM: PORTABLE CHEST 1 VIEW COMPARISON:  None. FINDINGS: Cardiac shadows within normal limits. Right chest wall port is noted in satisfactory position. No acute infiltrate or effusion is seen. No bony abnormality is noted. IMPRESSION: No acute abnormality seen. Electronically Signed   By: Inez Catalina M.D.   On: 08/24/2018 09:24   Dg Abd 2 Views  Result Date: 08/20/2018 CLINICAL DATA:  Pt was admitted for small bowel obstruction 2 days ago related to the lymphoma treated conservatively and had not moved his bowels for the last 7 days. Patient also noticed increasing bowel distention. Pt reported being diagnosed with a bowel obstruction and had a colonoscopy this morning. EXAM: ABDOMEN - 2 VIEW COMPARISON:  08/19/2018 and previous studies including a CT dated 08/17/2018. FINDINGS: There are dilated loops of small bowel with air-fluid levels. Dilation is similar to the previous day's exam. There is some air mixed with stool within a nondistended right colon. No free air. Nasogastric tube tip projects in the proximal stomach, unchanged. IMPRESSION: Persistent partial bowel obstruction.  No free air. Electronically Signed   By: Lajean Manes M.D.   On: 08/20/2018 17:42   Dg Abd 2 Views  Result Date: 08/11/2018 CLINICAL DATA:  Follow-up bowel obstruction EXAM: ABDOMEN - 2 VIEW COMPARISON:  08/01/2018, 07/31/2018, 07/30/2018 FINDINGS: Mild gas-filled enlarged loops of central small bowel up to 3.9 cm but overall decreased number and degree of small bowel distension. Some scattered colon gas is noted. There is no free air beneath the  diaphragm. IMPRESSION: Mild residual small bowel dilatation but overall decreased fluid levels and number of dilated small bowel loops, consistent with partial or resolving bowel obstruction. Findings are improved radiographically as compared with 08/01/2018. Electronically Signed   By: Donavan Foil M.D.   On: 08/11/2018 15:32   Dg Abd Portable 1 View  Result Date: 08/17/2018 CLINICAL DATA:  62 year old male status post NG tube placement. EXAM: PORTABLE ABDOMEN - 1 VIEW COMPARISON:  CT of the abdomen pelvis dated 08/17/2018 FINDINGS: An enteric tube is partially visualized with side-port in the gastric area and tip in the left upper abdomen likely in the proximal stomach. Dilated loops of small and large bowel noted in the visualized abdomen. IMPRESSION: Enteric tube with tip in the proximal stomach. Electronically Signed   By: Anner Crete M.D.   On: 08/17/2018 22:49   Dg Abd Portable 1v-small Bowel Obstruction Protocol-initial, 8 Hr Delay  Result Date: 07/31/2018 CLINICAL DATA:  Small-bowel obstruction, 8 hour delayed image. EXAM: PORTABLE ABDOMEN - 1 VIEW COMPARISON:  07/30/2018 FINDINGS: Moderate contrast distention of the gastric fundus. There are dilated gas-filled small bowel loops scattered throughout the abdomen and pelvis consistent with small bowel obstruction. There are contrast distended small bowel loops in the left hemiabdomen with what appear to be some enteric contrast within large bowel loops in the right upper quadrant. Findings would suggest a high grade partial SBO accounting for this pattern. No free air is noted. No organomegaly. No acute osseous appearing. IMPRESSION: Contrast noted within the stomach, small bowel loops in the left hemiabdomen and  admixed with stool within large bowel loops in the right upper quadrant. Findings would suggest a high-grade partial SBO. Electronically Signed   By: Ashley Royalty M.D.   On: 07/31/2018 22:02   [place] Ekg Site Rite  Result Date:  08/21/2018 If Site Rite image not attached, placement could not be confirmed due to current cardiac rhythm.   Microbiology: Recent Results (from the past 240 hour(s))  MRSA PCR Screening     Status: None   Collection Time: 08/24/18 11:26 AM  Result Value Ref Range Status   MRSA by PCR NEGATIVE NEGATIVE Final    Comment:        The GeneXpert MRSA Assay (FDA approved for NASAL specimens only), is one component of a comprehensive MRSA colonization surveillance program. It is not intended to diagnose MRSA infection nor to guide or monitor treatment for MRSA infections. Performed at [facility], [place] [address], [place], [place] [address]      Labs: Basic Metabolic Panel: Recent Labs  Lab 08/24/18 [phone] 08/25/18 0605 08/26/18 0806 08/27/18 0641 08/28/18 0600  NA 136 135 135 135 136  K 5.0 4.8 5.1 4.5 4.7  CL 106 104 102 101 103  CO2 25 22 24 24 24   GLUCOSE 121* 131* 146* 163* 161*  BUN 9 12 14 14 16   CREATININE 0.48* 0.45* 0.55* 0.57* 0.57*  CALCIUM 8.4* 8.6* 9.4 9.3 9.4  MG 2.0 1.9 2.1 1.9 1.8  PHOS 4.1 4.7* 3.8 3.9 4.8*   Liver Function Tests: Recent Labs  Lab 08/22/18 0633 08/24/18 0607 08/26/18 0806 08/27/18 0641 08/28/18 0600  AST 8* 9* 10* 13* 23  ALT 8 6 9 12 24   ALKPHOS 39 37* 44 50 65  BILITOT 0.4 0.6 0.5 0.5 0.4  PROT 4.5* 4.9* 6.2* 5.9* 5.9*  ALBUMIN 2.2* 2.2* 3.3* 3.1* 3.1*   No results for input(s): LIPASE, AMYLASE in the last 168 hours. No results for input(s): AMMONIA in the last 168 hours. CBC: Recent Labs  Lab 08/22/18 [phone] 08/23/18 0613 08/24/18 0607 08/25/18 0605 08/26/18 0806 08/27/18 0641 08/28/18 0600  WBC 4.7 3.5* 4.6 4.5 5.9 5.8 4.5  NEUTROABS 2.2 1.4*  --   --   --   --  1.9  HGB 8.2* 8.4* 8.1* 8.6* 9.7* 9.2* 9.1*  HCT 28.3* 28.6* 27.5* 28.6* 32.2* 31.0* 29.9*  MCV 102.9* 99.3 101.9* 99.0 100.0 101.3* 101.0*  PLT 194 157 129* 146* 153 149* 151   Cardiac Enzymes: No results for input(s): CKTOTAL, CKMB,  CKMBINDEX, TROPONINI in the last 168 hours. BNP: BNP (last 3 results) No results for input(s): BNP in the last 8760 hours.  ProBNP (last 3 results) No results for input(s): PROBNP in the last 8760 hours.  CBG: Recent Labs  Lab 08/26/18 2330 08/27/18 0619 08/27/18 1155 08/27/18 1721 08/28/18 0012  GLUCAP 146* 155* 153* 138* 166*       Signed:  Mahkayla Preece N  [facility] 08/28/2018, 11:12 AM

## 2018-08-28 NOTE — Progress Notes (Signed)
PHARMACY - ADULT TOTAL PARENTERAL NUTRITION CONSULT NOTE   Pharmacy Consult for TPN Indication: bowel obstruction  Patient Measurements: Height: 5' 9"  (175.3 cm) Weight: 182 lb 15.7 oz (83 kg) IBW/kg (Calculated) : 70.7 TPN AdjBW (KG): 75.6 Body mass index is 27.02 kg/m. Usual Weight:   Insulin Requirements: on sensitive SSI q6h, refusing fingersticks, insulin coverage  Current Nutrition: TPN  IVF: none  Central access: Port placed 03/27/18  TPN start date: 08/21/18  ASSESSMENT                                                                                                          HPI: Patient is a 62 y.o M with hx DVT, lymphoma and recurrent bowel obstruction, presented to the ED on 12/519 from PCP office for management of bowel obstruction.  TPN started on 12/9 to improve nutritional status for surgical interventions.  Significant events:  12/5: NGT; Abd CT showed sigmoid obstruction 12/8: flex sigmoidoscopy and colonoscopy (intrinsic narrowing of sigmoid colon) 12/9: started on TPN 12/13: loop ileostomy 12/14: start liquid diet 12/15: adv diet 12/16: reduce TPN rate to 25 ml/hr, d/c at 12N  Today:   Glucose (goal <150): all within goal range except one (175)  Electrolytes: K, phos, CorrCa wnl; Mag is at lower end of therapeutic range  Renal: scr stable  LFTs: AST low  TGs: 110 (12/10)  Prealbumin: 6.2 (12/8), <5 (12/10)  NUTRITIONAL GOALS                                                                                             RD recs (12/1): Kcal:2400-2600 kcal Protein:115-130 grams Fluid:>/= 2.4 L/day  Custom TPN at goal rate of 100 ml/hr provides: - 120 g/day protein ( 50  g/L) -  74 g/day Lipid  ( 31 g/L) - 384  g/day Dextrose ( 16 %) -  2530 Kcal/day  PLAN                                                                                                                          At 1000 today:  Reduce TPN rate to 25  ml/hr  Discontinue at 12N  Patient refusing finger sticks, d/c Novolog scale  Minda Ditto PharmD Pager [phone] 08/28/2018, 10:01 AM

## 2018-08-28 NOTE — Consult Note (Signed)
[place] Nurse ostomy consult note Stoma type/location:  RUQ, loop ilestomy Stomal assessment/size: 2" round, budded, pink, moist, identified functional os at 11 o'clock and distal os at 6 o'clock. Minor stomal sloughing noted at 3 and 9 o'clock  Peristomal assessment: intact  Treatment options for stomal/peristomal skin: NA Output liquid green/brow Ostomy pouching: 2pc. 2 3/4" placed  Education provided with patient and his wife Explained role of ostomy nurse and creation of stoma  Explained stoma characteristics (budded, flush, color, texture, care) Demonstrated pouch change (cutting new skin barrier, measuring stoma, cleaning peristomal skin and stoma). Allowed wife to cut skin barrier  Education on emptying when 1/3 to 1/2 full and how to empty Demonstrated "burping" flatus from pouch Demonstrated use of wick to clean spout  Discussed bathing, diet, gas, medication use, constipation Answered patient/family questions:   Discussed bathing/shower, clothing options, how to obtain supplies   Enrolled patient in [place] program: Yes  [facility] Nurse will follow along with you for continued support with ostomy teaching and care [place] MSN, RN, [place], [place], [place]

## 2018-08-28 NOTE — Care Management Note (Signed)
Case Management Note  Patient Details  Name: Mark Frey MRN: [id number] Date of Birth: 09/03/1956  Subjective/Objective:                  Discharge planning  Action/Plan: [facility]- Rn for stoma care/chooses [facility]/ inforamtion and order fax to [facility]  Expected Discharge Date:  (unknown)               Expected Discharge Plan:  [place]  In-House Referral:     Discharge planning Services  CM Consult  Post Acute Care Choice:  Home Health Choice offered to:  Spouse  DME Arranged:    DME Agency:     HH Arranged:  RN [place] Agency:  [place]  Status of Service:  In process, will continue to follow  If discussed at Long Length of Stay Meetings, dates discussed:    Additional Comments:  Leeroy Cha, RN 08/28/2018, 10:58 AM

## 2018-08-28 NOTE — Discharge Instructions (Signed)
Loop Ileostomy, Care After Refer to this sheet in the next few weeks. These instructions provide you with information about caring for yourself after your procedure. Your health care provider may also give you more specific instructions. Your treatment has been planned according to current medical practices, but problems sometimes occur. Call your health care provider if you have any problems or questions after your procedure. What can I expect after the procedure? After the procedure, it is common to have:  A small amount of blood or clear fluid leaking from your stoma.  Pain and discomfort in your abdomen, especially around your stoma.  Irregular bowel movements for several days.  Loose stool.  Follow these instructions at home: Medicines  Take over-the-counter and prescription medicines only as told by your health care provider.  If you were prescribed an antibiotic medicine, take it as told by your health care provider. Do not stop taking the antibiotic even if you start to feel better. Stoma Care  Keep your stoma and the surrounding skin clean and dry.  Follow your health care providers instructions about how to take care of your stoma. It is important to: ? Wash your hands with soap and water before you change your bandage (dressing). If soap and water are not available, use hand sanitizer. ? Change your dressing as told by your health care provider. ? Leave stitches (sutures), skin glue, or adhesive strips in place. In some cases, these skin closures may need to be in place for 2 weeks or longer. If adhesive strip edges start to loosen and curl up, you may trim the loose edges. Do not remove adhesive strips entirely unless your health care provider tells you to remove them.  Check your stoma area every day for signs of infection. Check for: ? More redness, swelling, or pain. ? More fluid or blood. ? Warmth. ? Pus or a bad smell.  Follow your health care providers  instructions about changing and cleaning your ostomy pouch.  Keep supplies to care for your stoma and ostomy pouch with you at all times. Eating and drinking   Follow instructions from your health care provider about eating or drinking restrictions.  Pay attention to which foods and drinks cause problems with digestion, such as gas, constipation, or diarrhea.  Avoid spicy foods and caffeine while your stoma heals.  Eat meals and snacks at regular intervals.  Drink enough fluid to keep your urine clear or pale yellow. Activity  Return to your normal activities as told by your health care provider. Ask your health care provider what activities are safe for you.  Rest as much as possible while your stoma heals.  Avoid intense physical activity for as long as you are told by your health care provider.  Do not lift anything that is heavier than 10 lb (4.5 kg) for 6 weeks or as long as told by your health care provider. Driving  Do not drive for 24 hours if you received a sedative.  Do not drive or operate heavy machinery while taking prescription pain medicine. General instructions  Wear compression stockings as told by your health care provider. These stockings help to prevent blood clots and reduce swelling in your legs.  Do not take baths, swim, or use a hot tub until your health care provider approves.  Do not use tobacco products, including cigarettes, chewing tobacco, or e-cigarettes. If you need help quitting, ask your health care provider.  (Women) If you plan to become pregnant or if  you take birth control pills, discuss this with your health care provider.  Keep all follow-up visits as told by your health care provider. This is important. Contact a health care provider if:  You have more redness, swelling, or pain around your stoma.  You have more fluid or blood coming from your stoma.  Your stoma feels warm to the touch.  You have pus or a bad smell coming from  your stoma.  You have a fever.  You have loose stools that do not get firmer after several weeks.  You have bowel movements more or less often than is expected by your health care provider.  You feel nauseous.  You vomit.  You have abdominal pain, bloating, pressure, or cramping.  You have problems with sexual activity.  You have an unusual lack of energy (fatigue).  You are unusually thirsty or you always have a dry mouth. Get help right away if:  You feel dizzy or lightheaded.  You have abdominal pain or cramps that do not go away with medicine or get worse.  Your stoma suddenly changes size or color.  You have shortness of breath.  You have bleeding from your stoma that does not stop.  You vomit more than once.  You faint.  You have internal tissue coming out of your stoma (prolapse).  You have an irregular heartbeat.  You have chest pain. This information is not intended to replace advice given to you by your health care provider. Make sure you discuss any questions you have with your health care provider. Document Released: 04/12/2011 Document Revised: 09/25/2015 Document Reviewed: 06/20/2015 Elsevier Interactive Patient Education  [employer].

## 2018-08-28 NOTE — Progress Notes (Signed)
Pt.refused CBG check  and insuline despite educating the importance of controlling BS.

## 2018-08-29 ENCOUNTER — Inpatient Hospital Stay: Payer: BLUE CROSS/BLUE SHIELD

## 2018-08-29 ENCOUNTER — Telehealth: Payer: Self-pay | Admitting: Hematology

## 2018-08-29 ENCOUNTER — Inpatient Hospital Stay: Payer: BLUE CROSS/BLUE SHIELD | Admitting: Hematology

## 2018-08-29 NOTE — Telephone Encounter (Signed)
Patient left message to cx 12/17. Returned call, was not able to reach patient.  Left message re call when ready to reschedule.

## 2018-08-30 ENCOUNTER — Telehealth: Payer: Self-pay | Admitting: *Deleted

## 2018-08-30 NOTE — Telephone Encounter (Signed)
Spouse called and asked when [name] wanted to see patient as he's ust out of hospital Monday following surgery.  Contacted spouse- left VM: Per [name], will see patient first full week of January. Will send message to scheduling and they will call.

## 2018-08-31 ENCOUNTER — Telehealth: Payer: Self-pay | Admitting: Family Medicine

## 2018-08-31 ENCOUNTER — Telehealth: Payer: Self-pay | Admitting: Hematology

## 2018-08-31 NOTE — Telephone Encounter (Signed)
see note   Copied from [place] #[id number]. Topic: General - Inquiry >> Aug 31, 2018  3:15 PM Margot Ables wrote: Reason for CRM: Asking if handicap placard can be renewed/extended for pt due to surgeries and cancer treatment. She states last time the form was at the office and Dr. Yong Channel completed. Please advise.

## 2018-08-31 NOTE — Telephone Encounter (Signed)
Scheduled appt per 12/18 sch message - pt wife is aware of appt date and time

## 2018-09-07 NOTE — Discharge Summary (Signed)
Physician Discharge Summary  Mark Frey FGH:[id number] DOB: 05/10/1956 DOA: 07/30/2018  PCP: [name], MD  Admit date: 07/30/2018 Discharge date: 08/04/2018   Discharge Diagnoses:  Principal Problem:   Bowel obstruction in setting of lymphoma and sigmoid stricture Active Problems:   Hypertension   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HCC)   Deep vein thrombosis (DVT) of femoral vein of right lower extremity (HCC)   Hypokalemia   Low magnesium level   Diverticulitis of colon   Discharge Condition: Stable   Filed Weights   07/30/18 2102  Weight: 99.4 kg   Hospital Course:  Recurrent SBO - he was recently hospitalized from 11/8 to 11/11 for the same, patient report he did not feel completely better since discharge -on empirical abx zosyn due to bowel wall thickening on ct scan, infection/maligancy? -small bowel protocol with gastrografin per general surgery, remain on ng suction -will follow general surgery recommendation 08/03/2018: Bowel movement reported.  Stool is becoming more formed.  Patient is on liquid diet.  Surgical team is directing care.  Potassium of 3.1 and magnesium of 1.5 noted, will replete.  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  Macrocytic anemia in the setting of malignancy: hgb above 9 per oncology Will transfuse prbc x1 on 11/19 08/03/2018: Hemoglobin is 8.7 g/dL today.  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  Obesity: Body mass index is 32.36 kg/m.  Consultations:  General surgery  Oncology  Discharge Exam: Vitals:   08/03/18 2025 08/04/18 0432  BP: 126/77 136/90  Pulse: 97 96  Resp: 18 18  Temp: 98.6 F (37 C) 98.2  F (36.8 C)  SpO2: 95% 94%    General: Not in any distress Cardiovascular: S1S2 Respiratory: Clear to auscultation  Discharge Instructions   Discharge Instructions    Increase activity slowly   Complete by:  As directed      Allergies as of 08/04/2018      Reactions   Ciprofloxacin Other (See Comments)   Leg tingling      Medication List    STOP taking these medications   Choline Fenofibrate 135 MG capsule   furosemide 20 MG tablet Commonly known as:  LASIX   HYDROcodone-acetaminophen 5-325 MG tablet Commonly known as:  NORCO   metroNIDAZOLE 500 MG tablet Commonly known as:  FLAGYL   ondansetron 8 MG tablet Commonly known as:  ZOFRAN   senna-docusate 8.6-50 MG tablet Commonly known as:  Senokot-S     TAKE these medications   apixaban 5 MG Tabs tablet Commonly known as:  ELIQUIS Take 1 tablet (5 mg total) by mouth 2 (two) times daily.   blood glucose meter kit and supplies Dispense based on patient and insurance preference. Use daily as directed. (E11.9).   glucose blood test strip Commonly known as:  FREESTYLE TEST STRIPS Use to check blood sugar daily   JANUMET 50-1000 MG tablet Generic drug:  sitaGLIPtin-metformin TAKE 1 TABLET TWICE DAILY  WITH MEALS   lidocaine-prilocaine cream Commonly known as:  EMLA Apply 1 application topically as needed. What changed:    when to take this  reasons to take this   Magnesium 500 MG Caps Take 1 capsule (500 mg total) by mouth 2 (two) times daily.  ASK your doctor about these medications   amoxicillin-clavulanate 875-125 MG tablet Commonly known as:  AUGMENTIN Take 1 tablet by mouth every 12 (twelve) hours for 9 days. Ask about: Should I take this medication?      Allergies  Allergen Reactions  . Ciprofloxacin Other (See Comments)    Leg tingling      The results of significant diagnostics from this hospitalization (including imaging, microbiology, ancillary and laboratory) are listed  below for reference.    Significant Diagnostic Studies: Dg Abd 1 View  Result Date: 08/19/2018 CLINICAL DATA:  62 year old male with bowel obstruction. EXAM: ABDOMEN - 1 VIEW COMPARISON:  Abdominal radiograph dated 08/17/2018 and CT dated 08/17/2018 FINDINGS: There is persistent diffuse dilatation of air-filled loops of small bowel measuring up to 6 cm in the left lower quadrant. No free air. The osseous structures and soft tissues are grossly unremarkable. IMPRESSION: Persistent small bowel dilatation.  Continued follow-up recommended. Electronically Signed   By: Anner Crete M.D.   On: 08/19/2018 05:40   Dg Abd 1 View  Result Date: 08/17/2018 CLINICAL DATA:  62 year old presenting with abdominal distention and absence of bowel movements over the past 5 days. Patient was recently released from the hospital after having a partial small bowel obstruction. Personal history of lymphoma. EXAM: ABDOMEN - 1 VIEW COMPARISON:  08/11/2018, 08/01/2018 and earlier. Chest x-ray 07/30/2018. FINDINGS: AP ERECT image was obtained. Multiple markedly distended loops of small bowel throughout the upper abdomen demonstrating air-fluid levels. Distal small bowel of normal caliber. Normal caliber colon with moderate stool burden in the ascending colon. No evidence of free intraperitoneal air. Linear scarring in the visualized lung bases, unchanged. IMPRESSION: 1. Recurrent partial small bowel obstruction. 2. No free intraperitoneal air. These results will be called to the ordering clinician or representative by the Radiologist Assistant, and communication documented in the PACS or zVision Dashboard. Electronically Signed   By: Evangeline Dakin M.D.   On: 08/17/2018 15:19   Ct Abdomen Pelvis W Contrast  Result Date: 08/17/2018 CLINICAL DATA:  62 year old male with concern for bowel obstruction. History of Burkitt's lymphoma and chemotherapy. EXAM: CT ABDOMEN AND PELVIS WITH CONTRAST TECHNIQUE: Multidetector CT imaging of  the abdomen and pelvis was performed using the standard protocol following bolus administration of intravenous contrast. CONTRAST:  11m ISOVUE-300 IOPAMIDOL (ISOVUE-300) INJECTION 61%, 315mOMNIPAQUE IOHEXOL 300 MG/ML SOLN COMPARISON:  CT of the abdomen pelvis dated 07/30/2018 and abdominal radiograph dated 08/17/2018 FINDINGS: Lower chest: There are bibasilar linear atelectasis/scarring. The tip of a central venous catheter is partially visualized at the cavoatrial junction. No intra-abdominal free air.  Trace fluid adjacent to the liver. Hepatobiliary: Subcentimeter hypodense focus in the dome of the liver is too small to characterize. The liver is otherwise unremarkable. No intrahepatic biliary ductal dilatation. There is a focal area of thickening at the gallbladder fundus most consistent with adenomyomatosis. This was present on the prior studies and was not metabolically active on the PET. No calcified gallstone. Pancreas: Unremarkable. No pancreatic ductal dilatation or surrounding inflammatory changes. Spleen: Normal in size without focal abnormality. Adrenals/Urinary Tract: The adrenal glands are unremarkable. There is a 5 mm nonobstructing calculus in the interpolar aspect of the right kidney. No hydronephrosis. Additional smaller nonobstructing stone noted in the superior pole of the right kidney. Subcentimeter bilateral renal hypodense lesions are not characterized but likely represent cysts. There is no hydronephrosis on either side. There is symmetric enhancement and excretion of contrast by both kidneys. The urinary bladder is grossly  unremarkable. Stomach/Bowel: There is segmental thickening of the sigmoid colon involving approximately 8 cm length. There is associated luminal narrowing and stricture of the sigmoid colon with moderate amount of retained stool within the colon proximal to this segment with an appearance of a moderate to high-grade obstruction. The thickened segment of the sigmoid  colon may be related to post treatment changes and resulting fibrosis although involvement with a neoplastic process is not excluded. This is however somewhat new since PET-CT of 06/09/2018. There is stranding of the fat in the pelvis adjacent to the sigmoid colon with tethering of the terminal ileum and appendix consistent with adhesions and scarring. There may be a degree of low-grade or partial small-bowel obstruction at the focus of tethering of the terminal ileum. There is fecalization of the terminal ileum. However, fecal content passes through the colon. The small bowel are dilated measuring up to 6 cm. The appendix is thickened and dilated with fecal content. It measures up to 2.5 cm in diameter. Multiple appendicular with noted in the distal appendix. The changes of the appendix are likely secondary to post radiation treatment. However, acute appendicitis is not entirely excluded. Clinical correlation is recommended. Vascular/Lymphatic: The abdominal aorta and IVC are grossly unremarkable. No portal venous gas. No adenopathy. Reproductive: The prostate and seminal vesicles are grossly unremarkable. Other: None Musculoskeletal: Degenerative changes of the spine. No acute osseous pathology. IMPRESSION: 1. Segmental thickening of the sigmoid colon with associated luminal narrowing and stricture and resulting obstruction at the level of the sigmoid colon. Findings likely related to post treatment changes although a colonic mass is not entirely excluded. 2. Diffusely dilated small bowel may represent an ileus or a degree of small-bowel obstruction at the level of the terminal ileum secondary to adhesions. Stool however passes into the colon. A small-bowel series may provide better evaluation of the degree of potential obstruction in the terminal ileum. 3. Dilated and thickened appendix, likely related to post treatment changes. Acute appendicitis is not entirely excluded. Clinical correlation is recommended.  Electronically Signed   By: Anner Crete M.D.   On: 08/17/2018 21:43   Ct Entero Abd/pelvis W Contast  Result Date: 08/23/2018 CLINICAL DATA:  62 year old male with history of potential low-grade bowel obstruction. Follow-up study. EXAM: CT ABDOMEN AND PELVIS WITH CONTRAST (ENTEROGRAPHY) TECHNIQUE: Multidetector CT of the abdomen and pelvis during bolus administration of intravenous contrast. Negative oral contrast was given. CONTRAST:  125m ISOVUE-300 IOPAMIDOL (ISOVUE-300) INJECTION 61% COMPARISON:  CT the abdomen and pelvis 08/17/2018. FINDINGS: Lower chest: Areas of scarring are noted throughout the lung bases bilaterally. Nasogastric tube in the distal esophagus. Central venous catheter tip extending into the superior aspect of the right atrium. Hepatobiliary: There are 2 subcentimeter low-attenuation lesions in the right lobe of the liver, too small to characterize, but statistically likely to represent tiny cysts. No other larger more suspicious appearing cystic or solid hepatic lesions. No intra or extrahepatic biliary ductal dilatation. Gallbladder is unremarkable in appearance. Pancreas: No pancreatic mass. No pancreatic ductal dilatation. No pancreatic or peripancreatic fluid or inflammatory changes. Spleen: Unremarkable. Adrenals/Urinary Tract: 2 nonobstructive calculi are noted within the right renal collecting system, largest of which is in the lower pole measuring 6 mm. In addition, in the distal third of the right ureter immediately before the right ureterovesicular junction there is a 3 mm calculus (axial image 76 of series 3). This is not associated with significant proximal hydroureteronephrosis to indicate urinary tract obstruction at this time. Several subcentimeter low-attenuation lesions  in both kidneys are too small to definitively characterize, but are favored to represent tiny cysts. Bilateral adrenal glands are normal in appearance. Urinary bladder is normal in appearance.  Stomach/Bowel: Tip of nasogastric tube terminates in the proximal stomach. As noted on the prior examination, there continues to be some mass-like mural thickening in the sigmoid colon estimated to measure approximately 8.7 x 4.7 x 4.4 cm (axial image 66 of series 3 and coronal image 44 of series 7), where there is also associated marked luminal narrowing. Proximal to this, there is moderate distension of the colon with several air-fluid levels. Multiple prominent borderline dilated loops of small bowel are noted measuring up to 2.8 cm in diameter, with several small air-fluid levels. These findings suggest very mild partial obstruction of the distal colon. In addition, there continues to be severe thickening of the wall of the terminal ileum with surrounding inflammatory changes. Notably, there is some unusual architectural distortion and soft tissue thickening extending from the anterior aspect of the distal ileum toward the anterior abdominal wall best appreciated on axial image 65 of series 3, potentially a developing fistula (this does not appear to extend beyond the peritoneal cavity at this time). In addition, the appendix remains dilated up to 20 mm in diameter, contain several appendicoliths, and continues to have some surrounding inflammatory changes in the periappendiceal fat, however, this appearance is relatively stable compared to the recent prior examinations. Inflammatory changes are also noted in the sigmoid mesocolon, and there is a small amount of interloop fluid in the right lower quadrant. Vascular/Lymphatic: Aortic atherosclerosis, without evidence of aneurysm or dissection in the abdominal or pelvic vasculature. No lymphadenopathy noted in the abdomen or pelvis. Reproductive: Prostate gland and seminal vesicles are unremarkable in appearance. Other: Trace volume.  Of ascites.  No pneumoperitoneum Musculoskeletal: There are no aggressive appearing lytic or blastic lesions noted in the  visualized portions of the skeleton. IMPRESSION: 1. Persistent mass-like mural thickening of the sigmoid colon which appears to cause mild partial obstruction. 2. Inflammatory changes throughout the soft tissues of the low anatomic pelvis involving the sigmoid mesocolon, the mesenteric fat adjacent to the distal/terminal ileum (which also demonstrates mural thickening), the as well as the periappendiceal fat adjacent to the appendix which remains dilated, thickened and inflamed with internal appendicoliths. Although these findings would typically be indicative of an acute process, the appearance is relatively stable compared to recent prior examinations, suggesting that some of this is post treatment related. There does appear to be extensive scarring in this region, including a potential fistulous tract developing from the anterior aspect of the terminal ileum (discussed above), which remains confined to the peritoneal cavity at this time. 3. Nonobstructive calculi in the collecting system of the right kidney measuring up to 6 mm in the lower pole. In addition, there is a 3 mm calculus in the distal third of the right ureter immediately before the right ureterovesicular junction. No proximal hydroureteronephrosis to indicate associated urinary tract obstruction at this time. 4. Aortic atherosclerosis. 5. Additional incidental findings, as above. Electronically Signed   By: Vinnie Langton M.D.   On: 08/23/2018 08:16   Dg Chest Port 1 View  Result Date: 08/24/2018 CLINICAL DATA:  Preoperative evaluation EXAM: PORTABLE CHEST 1 VIEW COMPARISON:  None. FINDINGS: Cardiac shadows within normal limits. Right chest wall port is noted in satisfactory position. No acute infiltrate or effusion is seen. No bony abnormality is noted. IMPRESSION: No acute abnormality seen. Electronically Signed   By: Elta Guadeloupe  Lukens M.D.   On: 08/24/2018 09:24   Dg Abd 2 Views  Result Date: 08/20/2018 CLINICAL DATA:  Pt was admitted for  small bowel obstruction 2 days ago related to the lymphoma treated conservatively and had not moved his bowels for the last 7 days. Patient also noticed increasing bowel distention. Pt reported being diagnosed with a bowel obstruction and had a colonoscopy this morning. EXAM: ABDOMEN - 2 VIEW COMPARISON:  08/19/2018 and previous studies including a CT dated 08/17/2018. FINDINGS: There are dilated loops of small bowel with air-fluid levels. Dilation is similar to the previous day's exam. There is some air mixed with stool within a nondistended right colon. No free air. Nasogastric tube tip projects in the proximal stomach, unchanged. IMPRESSION: Persistent partial bowel obstruction.  No free air. Electronically Signed   By: Lajean Manes M.D.   On: 08/20/2018 17:42   Dg Abd 2 Views  Result Date: 08/11/2018 CLINICAL DATA:  Follow-up bowel obstruction EXAM: ABDOMEN - 2 VIEW COMPARISON:  08/01/2018, 07/31/2018, 07/30/2018 FINDINGS: Mild gas-filled enlarged loops of central small bowel up to 3.9 cm but overall decreased number and degree of small bowel distension. Some scattered colon gas is noted. There is no free air beneath the diaphragm. IMPRESSION: Mild residual small bowel dilatation but overall decreased fluid levels and number of dilated small bowel loops, consistent with partial or resolving bowel obstruction. Findings are improved radiographically as compared with 08/01/2018. Electronically Signed   By: Donavan Foil M.D.   On: 08/11/2018 15:32   Dg Abd Portable 1 View  Result Date: 08/17/2018 CLINICAL DATA:  62 year old male status post NG tube placement. EXAM: PORTABLE ABDOMEN - 1 VIEW COMPARISON:  CT of the abdomen pelvis dated 08/17/2018 FINDINGS: An enteric tube is partially visualized with side-port in the gastric area and tip in the left upper abdomen likely in the proximal stomach. Dilated loops of small and large bowel noted in the visualized abdomen. IMPRESSION: Enteric tube with tip in the  proximal stomach. Electronically Signed   By: Anner Crete M.D.   On: 08/17/2018 22:49   [place] Ekg Site Rite  Result Date: 08/21/2018 If Site Rite image not attached, placement could not be confirmed due to current cardiac rhythm.   Microbiology: No results found for this or any previous visit (from the past 240 hour(s)).   Labs: Basic Metabolic Panel: No results for input(s): NA, K, CL, CO2, GLUCOSE, BUN, CREATININE, CALCIUM, MG, PHOS in the last 168 hours. Liver Function Tests: No results for input(s): AST, ALT, ALKPHOS, BILITOT, PROT, ALBUMIN in the last 168 hours. No results for input(s): LIPASE, AMYLASE in the last 168 hours. No results for input(s): AMMONIA in the last 168 hours. CBC: No results for input(s): WBC, NEUTROABS, HGB, HCT, MCV, PLT in the last 168 hours. Cardiac Enzymes: No results for input(s): CKTOTAL, CKMB, CKMBINDEX, TROPONINI in the last 168 hours. BNP: BNP (last 3 results) No results for input(s): BNP in the last 8760 hours.  ProBNP (last 3 results) No results for input(s): PROBNP in the last 8760 hours.  CBG: No results for input(s): GLUCAP in the last 168 hours.     Signed:  Dana Allan, MD  [facility] Pager #: [phone] 7PM-7AM contact night coverage as above

## 2018-09-18 DIAGNOSIS — Z7189 Other specified counseling: Secondary | ICD-10-CM | POA: Diagnosis not present

## 2018-09-19 DIAGNOSIS — Z932 Ileostomy status: Secondary | ICD-10-CM | POA: Diagnosis not present

## 2018-09-20 NOTE — Progress Notes (Signed)
HEMATOLOGY/ONCOLOGY CLINIC NOTE  Date of Service: 09/21/18    Patient Care Team: [name], MD as PCP - General (Family Medicine)  CHIEF COMPLAINTS/PURPOSE OF CONSULTATION:  F/u for continued Mx of Burkitts lymphoma  HISTORY OF PRESENTING ILLNESS:   Mark Frey is a wonderful 62 y.o. male who has been referred to [place] by my colleague Dr. 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 He is accompanied today by his wife. The pt reports that he is doing well overall.   The pt started R-EPOCH every 3 weeks with neulasta support on 03/17/18. He presented to the ED with right leg swelling, found to be a DVT and bilateral PE, which resulted in the incidental finding of a lower abdomen tumor measuring 18.7 x 18.5 x 17.8 cm. He denies any abdominal pains or changes in his bowel habits prior to this finding. He first noted blood in the stools 4-5 days prior to appearing to the ED.  The pt reports that he has been taking 164m Lovenox injections once each day, except for the days in which he had blood in the stools. He hasn't had blood in his stools for the last 7 days.   He has had GI bleed related to bowel involvement with his lymphoma - this is now resolving and his hgb is stabilizing.  He notes that his scrotum and leg swelling has decreased. He denies having CP or SOB at any point from his PE.   Most recent lab results (04/06/18) of CBC w/diff, CMP, Reticulocytes  is as follows: all values are WNL except for WBC at 15.3k, RBC at 3.65, HGB at 10.5, HCT at 31.4, RDW at 17.8, PLT at 640k, ANC at 12.7k, Monocytes abs at 1.6k, Glucose at 162, Total Protein at 6.2, Albumin at 3.1, Total bilirubin at <0.2, Retic ct pct at 2.5%. Uric acid 04/06/18 was low at 3.0 LDH 04/06/18 elevated at 251  On review of systems, pt reports remaining right leg swelling, blood in the stools 7 days ago, two  bowel movements each day, left leg swelling, improved scrotal swelling, eating well, and denies problems with his port, abdominal pains, constipation, diarrhea, jaw pain, pain along the spine, problems passing urine, and any other symptoms.  Interval History:   Mark WELSHANSreturns today for post hospitalization f/u . I last saw the patient as an inpatient on 08/21/18. He is accompanied today by his wife. The pt reports that he is doing well overall.   The pt reports that he has been eating much better and has gained 11 pounds in the last month. He will be following up with Dr. WRedmond Pullingin general surgery next week. The pt is also establishing care with GI tomorrow.   He notes that the swelling has returned to his bilateral ankles but denies any calf pain or redness. He notes that he this has been the case for the past 3-4 days, has been elevating his feet but has not used compression socks.   The pt notes that he is beginning to have more strength return to his legs since his surgery.   He has been taking his 10067mMagnesium BID. The pt notes that he has had mostly liquid stools, with a diverting ileostomy. He is also having some small stools through his rectum.   Lab results today (09/21/18) of CBC w/diff and CMP is as follows: all  values are WNL except for RBC at 3.22, HGB at 10.5, HCT at 33.8, MCV at 105.0, RDW at 20.0, Total Bilirubin at 0.2. 09/21/18 LDH at 157 09/21/18 Magnesium at 1.1  On review of systems, pt reports weight gain, increased appetite, eating well, liquid stools, bilateral leg swelling, moving his bowels, and denies abdominal pains, back pains, and any other symptoms.    MEDICAL HISTORY:  Past Medical History:  Diagnosis Date  . ALLERGIC RHINITIS   . Cancer ([place])    Lymphoma   . Diabetes mellitus   . Hyperlipidemia     SURGICAL HISTORY: Past Surgical History:  Procedure Laterality Date  . BIOPSY  03/15/2018   Procedure: BIOPSY;  Surgeon: Milus Banister, MD;   Location: [name];  Service: Endoscopy;;  . BOWEL RESECTION N/A 08/25/2018   Procedure: LAPROSCOPIC LOOP ILEOSTOMY;  Surgeon: Greer Pickerel, MD;  Location: [facility];  Service: General;  Laterality: N/A;  . CLEFT PALATE REPAIR    . COLONOSCOPY N/A 03/15/2018   Procedure: COLONOSCOPY;  Surgeon: Milus Banister, MD;  Location: [facility];  Service: Endoscopy;  Laterality: N/A;  . COLONOSCOPY N/A 08/20/2018   Procedure: COLONOSCOPY;  Surgeon: Irene Shipper, MD;  Location: [facility];  Service: Endoscopy;  Laterality: N/A;  . deviated septum repair     slight improvement  . ESOPHAGOGASTRODUODENOSCOPY N/A 03/15/2018   Procedure: ESOPHAGOGASTRODUODENOSCOPY (EGD);  Surgeon: Milus Banister, MD;  Location: [name];  Service: Endoscopy;  Laterality: N/A;  . IR IMAGING GUIDED PORT INSERTION  03/17/2018  . LAPAROSCOPY N/A 08/25/2018   Procedure: LAPAROSCOPY DIAGNOSTIC;  Surgeon: Greer Pickerel, MD;  Location: [facility];  Service: General;  Laterality: N/A;  . TONSILLECTOMY      SOCIAL HISTORY: Social History   Socioeconomic History  . Marital status: Married    Spouse name: lisa  . Number of children: 0  . Years of education: Not on file  . Highest education level: Not on file  Occupational History  . Not on file  Social Needs  . Financial resource strain: Not hard at all  . Food insecurity:    Worry: Never true    Inability: Never true  . Transportation needs:    Medical: No    Non-medical: No  Tobacco Use  . Smoking status: Never Smoker  . Smokeless tobacco: Never Used  Substance and Sexual Activity  . Alcohol use: Yes    Comment: occasional  . Drug use: No  . Sexual activity: Yes  Lifestyle  . Physical activity:    Days per week: 0 days    Minutes per session: 0 min  . Stress: Not at all  Relationships  . Social connections:    Talks on phone: More than three times a week    Gets together: More than three times a week    Attends religious service: 1 to 4 times per year      Active member of club or organization: No    Attends meetings of clubs or organizations: Never    Relationship status: Married  . Intimate partner violence:    Fear of current or ex partner: No    Emotionally abused: No    Physically abused: No    Forced sexual activity: No  Other Topics Concern  . Not on file  Social History Narrative   Married 1985. No kids. 4 small dogs.       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 Lung cancer Mother        smoker  . Brain cancer Mother        metastasis  . AAA (abdominal aortic aneurysm) Father        smoker    ALLERGIES:  is allergic to ciprofloxacin.  MEDICATIONS:  Current Outpatient Medications  Medication Sig Dispense Refill  . acetaminophen (TYLENOL) 325 MG tablet Take 2 tablets (650 mg total) by mouth every 6 (six) hours as needed.    [name] apixaban (ELIQUIS) 5 MG TABS tablet Take 1 tablet (5 mg total) by mouth 2 (two) times daily. 60 tablet 3  . blood glucose meter kit and supplies Dispense based on patient and insurance preference. Use daily as directed. (E11.9). 1 each 0  . glucose blood (FREESTYLE TEST STRIPS) test strip Use to check blood sugar daily 100 each 4  . JANUMET 50-1000 MG tablet TAKE 1 TABLET TWICE DAILY  WITH MEALS 180 tablet 3  . lidocaine-prilocaine (EMLA) cream Apply 1 application topically as needed. (Patient taking differently: Apply 1 application topically daily as needed (access port). ) 30 g 2  . Magnesium 500 MG CAPS Take 1 capsule (500 mg total) by mouth 2 (two) times daily. 60 capsule 0  . Probiotic Product (PROBIOTIC-10) CAPS Take 1 capsule by mouth daily.     No current facility-administered medications for this visit.     REVIEW OF SYSTEMS:    A 10+ POINT REVIEW OF SYSTEMS WAS OBTAINED including neurology, dermatology, psychiatry, cardiac, respiratory, lymph,  extremities, GI, GU, Musculoskeletal, constitutional, breasts, reproductive, HEENT.  All pertinent positives are noted in the HPI.  All others are negative.   PHYSICAL EXAMINATION: ECOG PERFORMANCE STATUS: 1 - Symptomatic but completely ambulatory  Vitals:   09/21/18 1512  BP: 119/79  Pulse: (!) 104  Resp: 18  Temp: 98.1 F (36.7 C)  SpO2: 100%   Filed Weights   09/21/18 1512  Weight: 210 lb 12.8 oz (95.6 kg)   .Body mass index is 31.13 kg/m.  GENERAL:alert, in no acute distress and comfortable SKIN: no acute rashes, no significant lesions EYES: conjunctiva are pink and non-injected, sclera anicteric OROPHARYNX: MMM, no exudates, no oropharyngeal erythema or ulceration NECK: supple, no JVD LYMPH:  no palpable lymphadenopathy in the cervical, axillary or inguinal regions LUNGS: clear to auscultation b/l with normal respiratory effort HEART: regular rate & rhythm ABDOMEN:  normoactive bowel sounds , non tender, not distended. No palpable hepatosplenomegaly.  Extremity: no pedal edema PSYCH: alert & oriented x 3 with fluent speech NEURO: no focal motor/sensory deficits e  LABORATORY DATA:   . CBC Latest Ref Rng & Units 09/21/2018 08/28/2018 08/27/2018  WBC 4.0 - 10.5 K/uL 6.6 4.5 5.8  Hemoglobin 13.0 - 17.0 g/dL 10.5(L) 9.1(L) 9.2(L)  Hematocrit 39.0 - 52.0 % 33.8(L) 29.9(L) 31.0(L)  Platelets 150 - 400 K/uL 192 151 149(L)    . CMP Latest Ref Rng & Units 09/21/2018 08/28/2018 08/27/2018  Glucose 70 - 99 mg/dL 79 161(H) 163(H)  BUN 8 - 23 mg/dL _0 Creatinine 0.61 - 1.24 mg/dL 0.75 0.57(L) 0.57(L)  Sodium 135 - 145 mmol/L 143 136 135  Potassium 3.5 - 5.1 mmol/L 3.9 4.7 4.5  Chloride 98 - 111 mmol/L 107 103 101  CO2 22 - 32 mmol/L _1 Calcium 8.9 - 10.3 mg/dL 9.4 9.4 9.3  Total Protein 6.5 - 8.1  g/dL 7.0 5.9(L) 5.9(L)  Total Bilirubin 0.3 - 1.2 mg/dL 0.2(L) 0.4 0.5  Alkaline Phos 38 - 126 U/L 77 65 50  AST 15 - 41 U/L 29 23 13(L)  ALT 0 - 44 U/L 33 24 12     Component     Latest Ref Rng & Units 09/21/2018  LDH     98 - 192 U/L 157  Magnesium     1.7 - 2.4 mg/dL 1.1 (LL)    03/17/18 Cytogenetics:   03/17/18 Colon Bx:   03/13/18 Peritoneum Biopsy:     RADIOGRAPHIC STUDIES:  I have personally reviewed the radiological images as listed and agreed with the findings in the report. Ct Entero Abd/pelvis W Contast  Result Date: 08/23/2018 CLINICAL DATA:  63 year old male with history of potential low-grade bowel obstruction. Follow-up study. EXAM: CT ABDOMEN AND PELVIS WITH CONTRAST (ENTEROGRAPHY) TECHNIQUE: Multidetector CT of the abdomen and pelvis during bolus administration of intravenous contrast. Negative oral contrast was given. CONTRAST:  172m ISOVUE-300 IOPAMIDOL (ISOVUE-300) INJECTION 61% COMPARISON:  CT the abdomen and pelvis 08/17/2018. FINDINGS: Lower chest: Areas of scarring are noted throughout the lung bases bilaterally. Nasogastric tube in the distal esophagus. Central venous catheter tip extending into the superior aspect of the right atrium. Hepatobiliary: There are 2 subcentimeter low-attenuation lesions in the right lobe of the liver, too small to characterize, but statistically likely to represent tiny cysts. No other larger more suspicious appearing cystic or solid hepatic lesions. No intra or extrahepatic biliary ductal dilatation. Gallbladder is unremarkable in appearance. Pancreas: No pancreatic mass. No pancreatic ductal dilatation. No pancreatic or peripancreatic fluid or inflammatory changes. Spleen: Unremarkable. Adrenals/Urinary Tract: 2 nonobstructive calculi are noted within the right renal collecting system, largest of which is in the lower pole measuring 6 mm. In addition, in the distal third of the right ureter immediately before the right ureterovesicular junction there is a 3 mm calculus (axial image 76 of series 3). This is not associated with significant proximal hydroureteronephrosis to indicate urinary tract  obstruction at this time. Several subcentimeter low-attenuation lesions in both kidneys are too small to definitively characterize, but are favored to represent tiny cysts. Bilateral adrenal glands are normal in appearance. Urinary bladder is normal in appearance. Stomach/Bowel: Tip of nasogastric tube terminates in the proximal stomach. As noted on the prior examination, there continues to be some mass-like mural thickening in the sigmoid colon estimated to measure approximately 8.7 x 4.7 x 4.4 cm (axial image 66 of series 3 and coronal image 44 of series 7), where there is also associated marked luminal narrowing. Proximal to this, there is moderate distension of the colon with several air-fluid levels. Multiple prominent borderline dilated loops of small bowel are noted measuring up to 2.8 cm in diameter, with several small air-fluid levels. These findings suggest very mild partial obstruction of the distal colon. In addition, there continues to be severe thickening of the wall of the terminal ileum with surrounding inflammatory changes. Notably, there is some unusual architectural distortion and soft tissue thickening extending from the anterior aspect of the distal ileum toward the anterior abdominal wall best appreciated on axial image 65 of series 3, potentially a developing fistula (this does not appear to extend beyond the peritoneal cavity at this time). In addition, the appendix remains dilated up to 20 mm in diameter, contain several appendicoliths, and continues to have some surrounding inflammatory changes in the periappendiceal fat, however, this appearance is relatively stable compared to the recent prior examinations.  Inflammatory changes are also noted in the sigmoid mesocolon, and there is a small amount of interloop fluid in the right lower quadrant. Vascular/Lymphatic: Aortic atherosclerosis, without evidence of aneurysm or dissection in the abdominal or pelvic vasculature. No lymphadenopathy  noted in the abdomen or pelvis. Reproductive: Prostate gland and seminal vesicles are unremarkable in appearance. Other: Trace volume.  Of ascites.  No pneumoperitoneum Musculoskeletal: There are no aggressive appearing lytic or blastic lesions noted in the visualized portions of the skeleton. IMPRESSION: 1. Persistent mass-like mural thickening of the sigmoid colon which appears to cause mild partial obstruction. 2. Inflammatory changes throughout the soft tissues of the low anatomic pelvis involving the sigmoid mesocolon, the mesenteric fat adjacent to the distal/terminal ileum (which also demonstrates mural thickening), the as well as the periappendiceal fat adjacent to the appendix which remains dilated, thickened and inflamed with internal appendicoliths. Although these findings would typically be indicative of an acute process, the appearance is relatively stable compared to recent prior examinations, suggesting that some of this is post treatment related. There does appear to be extensive scarring in this region, including a potential fistulous tract developing from the anterior aspect of the terminal ileum (discussed above), which remains confined to the peritoneal cavity at this time. 3. Nonobstructive calculi in the collecting system of the right kidney measuring up to 6 mm in the lower pole. In addition, there is a 3 mm calculus in the distal third of the right ureter immediately before the right ureterovesicular junction. No proximal hydroureteronephrosis to indicate associated urinary tract obstruction at this time. 4. Aortic atherosclerosis. 5. Additional incidental findings, as above. Electronically Signed   By: Vinnie Langton M.D.   On: 08/23/2018 08:16   Dg Chest Port 1 View  Result Date: 08/24/2018 CLINICAL DATA:  Preoperative evaluation EXAM: PORTABLE CHEST 1 VIEW COMPARISON:  None. FINDINGS: Cardiac shadows within normal limits. Right chest wall port is noted in satisfactory position. No  acute infiltrate or effusion is seen. No bony abnormality is noted. IMPRESSION: No acute abnormality seen. Electronically Signed   By: Inez Catalina M.D.   On: 08/24/2018 09:24    ASSESSMENT & PLAN:   63 y.o. male with  1. 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 No BM or CNS involvement.   04/18/18 PET/CT revealed Continued improvement in right colonic wall thickening and surrounding bulky masses consistent with treated lymphoma. No hypermetabolic activity to suggest residual tumor. There is some hypermetabolic activity associated with the sigmoid colon which demonstrates mild wall thickening, surrounding inflammation and underlying diverticulosis, suggesting mild diverticulitis. Suspected treatment related changes throughout the bone marrow. There is mildly increased activity within the clivus without clear corresponding finding on the CT images. Attention on follow-up recommended. Nonobstructing right renal calculi. 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   06/09/18 PET/CT revealedSoft tissue lesion within the ventral pelvis is again identified demonstrating mild to moderate increased uptake within SUV max of 5.61. Deauville criteria 4. Concerning for residual metabolically active tumor. 2. Similar appearance of diffusely thickened appendix within the right lower  quadrant of the abdomen. Findings may reflect treatment related changes. 3. Increased radiotracer uptake  throughout the bone marrow which is favored to represent treatment related changes. 4. Lad coronary artery atherosclerotic calcifications.   patient completed planned chemorx on 10/21  2.H/oRLE DVT and b/l PEon anticoagulation.  3.h/oGI bleeding from lymphoma involving bowels on presentation-- resolved at this time -- will need to monitor with anticoagulation.  4. DM2 with some increased hyperglycemia with steroids Plan -monitor  -continue Oral hypoglycemics and carb consistent diet at this time.   5. Small bowel obstruction/ileus with sigmoid thickening/stenosis .  CT scan - sigmoid obstructive lesion ? Scar tissue from resolved lymphoma vs residual lymphoma vs post inflammatory scarring from diverticulitis/limited perforation. Also has SBO from adhesions. Colonoscopy 08/21/2018 - no intramucosal lesions. Extrinsic compression of sigmoid colon. 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 Greer Pickerel  PLAN: -Discussed pt labwork today, 09/21/18; Magnesium low at 1.1, HGB improving to 10.5 after recent surgery. WBC normal at 6.6k, PLT normal at 192k -Continue PO 566m Magnesium BID replacement -IV Magnesium 4g replacement and will check labs again in one week -Continue Eliquis  -Feet elevation and compression sock use -Recommend consuming mixed nut powder and other magnesium rich foods -Will repeat CT to follow up on previous area of thickening seen on last CT from 08/23/18. -Will also refer the pt to Rad Onc for discussion and ISRT considerationfor Burkitts lymphoma with bulky disease s/p chemotherapy -Will consider percutaneous biopsy if necessary and possible  -Will refer the pt to outpatient PT which he does prefer -Will refer the pt to our nutritional therapist BErnestene Kiel-Will see the pt back in 3 weeks   IV Magnesium tomorrow afternoon Labs in 1 week CT abd/pelvis in 2 weeks Radiation oncology referral in 2 weeks post CT RTC with dr KIrene Limboin 3 weeks with  labs Referral to outpatient cancer rehab center [facility]    All of the patients questions were answered with apparent satisfaction. The patient knows to call the clinic with any problems, questions or concerns.  The total time spent in the appt was 40 minutes and more than 50% was on counseling and direct patient cares.    GSullivan LoneMD MWashburnAAHIVMS SGenesis Asc Partners LLC Dba Genesis Surgery CenterCChi St Joseph Health Madison HospitalHematology/Oncology Physician [facility] (Office):       [phone](Work cell):  [phone](Fax):           [phone] I, SBaldwin Jamaica am acting as a scribe for Dr. GSullivan Lone   .I have reviewed the above documentation for accuracy and completeness, and I agree with the above. .Brunetta GeneraMD

## 2018-09-21 ENCOUNTER — Telehealth: Payer: Self-pay | Admitting: Hematology

## 2018-09-21 ENCOUNTER — Inpatient Hospital Stay: Payer: BLUE CROSS/BLUE SHIELD

## 2018-09-21 ENCOUNTER — Inpatient Hospital Stay: Payer: BLUE CROSS/BLUE SHIELD | Attending: Hematology | Admitting: Hematology

## 2018-09-21 VITALS — BP 119/79 | HR 104 | Temp 98.1°F | Resp 18 | Ht 69.0 in | Wt 210.8 lb

## 2018-09-21 DIAGNOSIS — Z7901 Long term (current) use of anticoagulants: Secondary | ICD-10-CM | POA: Diagnosis not present

## 2018-09-21 DIAGNOSIS — Z79899 Other long term (current) drug therapy: Secondary | ICD-10-CM | POA: Insufficient documentation

## 2018-09-21 DIAGNOSIS — C8378 Burkitt lymphoma, lymph nodes of multiple sites: Secondary | ICD-10-CM | POA: Diagnosis not present

## 2018-09-21 DIAGNOSIS — E440 Moderate protein-calorie malnutrition: Secondary | ICD-10-CM | POA: Diagnosis not present

## 2018-09-21 DIAGNOSIS — K56600 Partial intestinal obstruction, unspecified as to cause: Secondary | ICD-10-CM

## 2018-09-21 DIAGNOSIS — Z86718 Personal history of other venous thrombosis and embolism: Secondary | ICD-10-CM | POA: Diagnosis not present

## 2018-09-21 LAB — CBC WITH DIFFERENTIAL/PLATELET
Abs Immature Granulocytes: 0.02 10*3/uL (ref 0.00–0.07)
BASOS PCT: 1 %
Basophils Absolute: 0.1 10*3/uL (ref 0.0–0.1)
EOS ABS: 0 10*3/uL (ref 0.0–0.5)
Eosinophils Relative: 1 %
HCT: 33.8 % — ABNORMAL LOW (ref 39.0–52.0)
Hemoglobin: 10.5 g/dL — ABNORMAL LOW (ref 13.0–17.0)
Immature Granulocytes: 0 %
Lymphocytes Relative: 40 %
Lymphs Abs: 2.6 10*3/uL (ref 0.7–4.0)
MCH: 32.6 pg (ref 26.0–34.0)
MCHC: 31.1 g/dL (ref 30.0–36.0)
MCV: 105 fL — AB (ref 80.0–100.0)
Monocytes Absolute: 0.7 10*3/uL (ref 0.1–1.0)
Monocytes Relative: 10 %
Neutro Abs: 3.2 10*3/uL (ref 1.7–7.7)
Neutrophils Relative %: 48 %
Platelets: 192 10*3/uL (ref 150–400)
RBC: 3.22 MIL/uL — ABNORMAL LOW (ref 4.22–5.81)
RDW: 20 % — ABNORMAL HIGH (ref 11.5–15.5)
WBC: 6.6 10*3/uL (ref 4.0–10.5)
nRBC: 0 % (ref 0.0–0.2)

## 2018-09-21 LAB — CMP (CANCER CENTER ONLY)
ALK PHOS: 77 U/L (ref 38–126)
ALT: 33 U/L (ref 0–44)
AST: 29 U/L (ref 15–41)
Albumin: 3.9 g/dL (ref 3.5–5.0)
Anion gap: 12 (ref 5–15)
BUN: 11 mg/dL (ref 8–23)
CO2: 24 mmol/L (ref 22–32)
Calcium: 9.4 mg/dL (ref 8.9–10.3)
Chloride: 107 mmol/L (ref 98–111)
Creatinine: 0.75 mg/dL (ref 0.61–1.24)
GFR, Estimated: >60 mL/min (ref >60)
Glucose, Bld: 79 mg/dL (ref 70–99)
Potassium: 3.9 mmol/L (ref 3.5–5.1)
Sodium: 143 mmol/L (ref 135–145)
Total Bilirubin: 0.2 mg/dL — ABNORMAL LOW (ref 0.3–1.2)
Total Protein: 7 g/dL (ref 6.5–8.1)

## 2018-09-21 LAB — MAGNESIUM: Magnesium: 1.1 mg/dL — CL (ref 1.7–2.4)

## 2018-09-21 LAB — LACTATE DEHYDROGENASE: LDH: 157 U/L (ref 98–192)

## 2018-09-21 NOTE — Telephone Encounter (Signed)
This form was completed and mailed to patient and his wife several weeks ago.

## 2018-09-21 NOTE — Telephone Encounter (Signed)
Printed calendar and avs. °

## 2018-09-22 ENCOUNTER — Ambulatory Visit: Payer: BLUE CROSS/BLUE SHIELD | Admitting: Gastroenterology

## 2018-09-22 MED ORDER — SODIUM CHLORIDE 0.9 % IV SOLN
4.0000 g | Freq: Once | INTRAVENOUS | Status: AC
  Administered 2018-09-23: 4 g via INTRAVENOUS
  Filled 2018-09-22: qty 8

## 2018-09-23 ENCOUNTER — Inpatient Hospital Stay: Payer: BLUE CROSS/BLUE SHIELD

## 2018-09-23 VITALS — BP 116/77 | HR 90 | Temp 97.8°F | Resp 17

## 2018-09-23 DIAGNOSIS — C8378 Burkitt lymphoma, lymph nodes of multiple sites: Secondary | ICD-10-CM | POA: Diagnosis not present

## 2018-09-23 DIAGNOSIS — C8510 Unspecified B-cell lymphoma, unspecified site: Secondary | ICD-10-CM

## 2018-09-23 DIAGNOSIS — Z95828 Presence of other vascular implants and grafts: Secondary | ICD-10-CM

## 2018-09-23 DIAGNOSIS — Z7901 Long term (current) use of anticoagulants: Secondary | ICD-10-CM | POA: Diagnosis not present

## 2018-09-23 DIAGNOSIS — Z86718 Personal history of other venous thrombosis and embolism: Secondary | ICD-10-CM | POA: Diagnosis not present

## 2018-09-23 DIAGNOSIS — Z7189 Other specified counseling: Secondary | ICD-10-CM

## 2018-09-23 DIAGNOSIS — Z79899 Other long term (current) drug therapy: Secondary | ICD-10-CM | POA: Diagnosis not present

## 2018-09-23 DIAGNOSIS — E440 Moderate protein-calorie malnutrition: Secondary | ICD-10-CM | POA: Diagnosis not present

## 2018-09-23 MED ORDER — SODIUM CHLORIDE 0.9% FLUSH
10.0000 mL | Freq: Once | INTRAVENOUS | Status: AC
  Administered 2018-09-23: 10 mL
  Filled 2018-09-23: qty 10

## 2018-09-23 MED ORDER — HEPARIN SOD (PORK) LOCK FLUSH 100 UNIT/ML IV SOLN
500.0000 [IU] | Freq: Once | INTRAVENOUS | Status: AC
  Administered 2018-09-23: 500 [IU]
  Filled 2018-09-23: qty 5

## 2018-09-23 NOTE — Patient Instructions (Signed)
Hypomagnesemia Hypomagnesemia is a condition in which the level of magnesium in the blood is low. Magnesium is a mineral that is found in many foods. It is used in many different processes in the body. Hypomagnesemia can affect every organ in the body. In severe cases, it can cause life-threatening problems. What are the causes? This condition may be caused by:  Not getting enough magnesium in your diet.  Malnutrition.  Problems with absorbing magnesium from the intestines.  Dehydration.  Alcohol abuse.  Vomiting.  Severe or chronic diarrhea.  Some medicines, including medicines that make you urinate more (diuretics).  Certain diseases, such as kidney disease, diabetes, celiac disease, and overactive thyroid. What are the signs or symptoms? Symptoms of this condition include:  Loss of appetite.  Nausea and vomiting.  Involuntary shaking or trembling of a body part (tremor).  Muscle weakness.  Tingling in the arms and legs.  Sudden tightening of muscles (muscle spasms).  Confusion.  Psychiatric issues, such as depression, irritability, or psychosis.  A feeling of fluttering of the heart.  Seizures. These symptoms are more severe if magnesium levels drop suddenly. How is this diagnosed? This condition may be diagnosed based on:  Your symptoms and medical history.  A physical exam.  Blood and urine tests. How is this treated? Treatment depends on the cause and the severity of the condition. It may be treated with:  A magnesium supplement. This can be taken in pill form. If the condition is severe, magnesium is usually given through an IV.  Changes to your diet. You may be directed to eat foods that have a lot of magnesium, such as green leafy vegetables, peas, beans, and nuts.  Stopping any intake of alcohol. Follow these instructions at home:      Make sure that your diet includes foods with magnesium. Foods that have a lot of magnesium in them  include: ? Green leafy vegetables, such as spinach and broccoli. ? Beans and peas. ? Nuts and seeds, such as almonds and sunflower seeds. ? Whole grains, such as whole grain bread and fortified cereals.  Take magnesium supplements if your health care provider tells you to do that. Take them as directed.  Take over-the-counter and prescription medicines only as told by your health care provider.  Have your magnesium levels monitored as told by your health care provider.  When you are active, drink fluids that contain electrolytes.  Avoid drinking alcohol.  Keep all follow-up visits as told by your health care provider. This is important. Contact a health care provider if:  You get worse instead of better.  Your symptoms return. Get help right away if you:  Develop severe muscle weakness.  Have trouble breathing.  Feel that your heart is racing. Summary  Hypomagnesemia is a condition in which the level of magnesium in the blood is low.  Hypomagnesemia can affect every organ in the body.  Treatment may include eating more foods that contain magnesium, taking magnesium supplements, and not drinking alcohol.  Have your magnesium levels monitored as told by your health care provider. This information is not intended to replace advice given to you by your health care provider. Make sure you discuss any questions you have with your health care provider. Document Released: 05/26/2005 Document Revised: 08/01/2017 Document Reviewed: 08/01/2017 Elsevier Interactive Patient Education  2019 [employer].

## 2018-09-26 ENCOUNTER — Telehealth: Payer: Self-pay | Admitting: *Deleted

## 2018-09-26 ENCOUNTER — Other Ambulatory Visit: Payer: Self-pay | Admitting: Hematology

## 2018-09-26 DIAGNOSIS — C8378 Burkitt lymphoma, lymph nodes of multiple sites: Secondary | ICD-10-CM

## 2018-09-26 NOTE — Telephone Encounter (Signed)
Wife called to clarify Dr. Grier Mitts plans when patient saw him on 09/21/2018 because no one has called them: CT of abdomen; Referral to Radiation Oncology following CT; Home health PT.  Contacted spouse with following information: CT is scheduled for 10/05/2018 @ 10:30am, contact number is [phone]; Referral re-sent to Radiation Oncology; Referral re-sent to OP. Spouse verbalized understanding. Encouraged spouse to contact office if they are not contacted by these departments.

## 2018-09-27 ENCOUNTER — Inpatient Hospital Stay: Payer: BLUE CROSS/BLUE SHIELD | Admitting: Nutrition

## 2018-09-27 NOTE — Progress Notes (Signed)
Nutrition follow-up completed with patient and wife. Patient is status post ileostomy secondary to small bowel obstruction on December 13. Patient reports he is feeling much better after surgery. He is gaining weight.  Weight improved to 210.8 pounds January 9 up from 197 pounds November 29. Patient is tolerating most foods without difficulty. Reports stool consistency varies. He is drinking a lot of water but does not know how much. Reports he empties his ileostomy bag about 8 times a day but it is only 1/4-1/2 full. Reviewed labs.  Noted magnesium decreased at 1.1.  Patient is taking a magnesium supplement.  Nutrition diagnosis: Food and nutrition related knowledge deficit continues. Severe malnutrition is improving.  Intervention: I educated patient on the importance of continuing adequate protein intake. Reviewed high magnesium foods and provided fact sheet. Recommended patient continue at least 80 ounces of water daily. Provided brief information on ileostomy nutrition guidelines and provided fact sheet. Questions were answered.  Teach back method used.  Contact information given.  Monitoring, evaluation, goals: Patient will work to continue adequate calorie and protein intake to improve strength and healing.  Next visit: To be scheduled as needed.  **Disclaimer: This note was dictated with voice recognition software. Similar sounding words can inadvertently be transcribed and this note may contain transcription errors which may not have been corrected upon publication of note.**

## 2018-09-28 ENCOUNTER — Inpatient Hospital Stay: Payer: BLUE CROSS/BLUE SHIELD

## 2018-09-28 DIAGNOSIS — Z79899 Other long term (current) drug therapy: Secondary | ICD-10-CM | POA: Diagnosis not present

## 2018-09-28 DIAGNOSIS — Z7901 Long term (current) use of anticoagulants: Secondary | ICD-10-CM | POA: Diagnosis not present

## 2018-09-28 DIAGNOSIS — C8378 Burkitt lymphoma, lymph nodes of multiple sites: Secondary | ICD-10-CM | POA: Diagnosis not present

## 2018-09-28 DIAGNOSIS — E440 Moderate protein-calorie malnutrition: Secondary | ICD-10-CM | POA: Diagnosis not present

## 2018-09-28 DIAGNOSIS — Z86718 Personal history of other venous thrombosis and embolism: Secondary | ICD-10-CM | POA: Diagnosis not present

## 2018-09-28 LAB — CBC WITH DIFFERENTIAL/PLATELET
Abs Immature Granulocytes: 0.01 10*3/uL (ref 0.00–0.07)
Basophils Absolute: 0.1 10*3/uL (ref 0.0–0.1)
Basophils Relative: 1 %
Eosinophils Absolute: 0 10*3/uL (ref 0.0–0.5)
Eosinophils Relative: 1 %
HEMATOCRIT: 35.9 % — AB (ref 39.0–52.0)
Hemoglobin: 11.1 g/dL — ABNORMAL LOW (ref 13.0–17.0)
Immature Granulocytes: 0 %
Lymphocytes Relative: 50 %
Lymphs Abs: 2.5 10*3/uL (ref 0.7–4.0)
MCH: 32.4 pg (ref 26.0–34.0)
MCHC: 30.9 g/dL (ref 30.0–36.0)
MCV: 104.7 fL — AB (ref 80.0–100.0)
Monocytes Absolute: 0.7 10*3/uL (ref 0.1–1.0)
Monocytes Relative: 14 %
Neutro Abs: 1.6 10*3/uL — ABNORMAL LOW (ref 1.7–7.7)
Neutrophils Relative %: 34 %
Platelets: 203 10*3/uL (ref 150–400)
RBC: 3.43 MIL/uL — ABNORMAL LOW (ref 4.22–5.81)
RDW: 19.6 % — ABNORMAL HIGH (ref 11.5–15.5)
WBC: 4.9 10*3/uL (ref 4.0–10.5)
nRBC: 0 % (ref 0.0–0.2)

## 2018-09-28 LAB — CMP (CANCER CENTER ONLY)
ALBUMIN: 4.2 g/dL (ref 3.5–5.0)
ALT: 34 U/L (ref 0–44)
AST: 28 U/L (ref 15–41)
Alkaline Phosphatase: 84 U/L (ref 38–126)
Anion gap: 10 (ref 5–15)
BUN: 12 mg/dL (ref 8–23)
CO2: 26 mmol/L (ref 22–32)
Calcium: 9.9 mg/dL (ref 8.9–10.3)
Chloride: 108 mmol/L (ref 98–111)
Creatinine: 0.77 mg/dL (ref 0.61–1.24)
GFR, Est AFR Am: >60 mL/min (ref >60)
GFR, Estimated: >60 mL/min (ref >60)
Glucose, Bld: 91 mg/dL (ref 70–99)
POTASSIUM: 4.5 mmol/L (ref 3.5–5.1)
Sodium: 144 mmol/L (ref 135–145)
TOTAL PROTEIN: 7.4 g/dL (ref 6.5–8.1)
Total Bilirubin: 0.3 mg/dL (ref 0.3–1.2)

## 2018-09-28 LAB — LACTATE DEHYDROGENASE: LDH: 139 U/L (ref 98–192)

## 2018-09-29 ENCOUNTER — Other Ambulatory Visit: Payer: Self-pay

## 2018-09-29 ENCOUNTER — Ambulatory Visit: Payer: BLUE CROSS/BLUE SHIELD | Attending: Hematology | Admitting: Physical Therapy

## 2018-09-29 DIAGNOSIS — I890 Lymphedema, not elsewhere classified: Secondary | ICD-10-CM | POA: Insufficient documentation

## 2018-09-29 DIAGNOSIS — M6281 Muscle weakness (generalized): Secondary | ICD-10-CM | POA: Diagnosis not present

## 2018-09-29 DIAGNOSIS — R293 Abnormal posture: Secondary | ICD-10-CM | POA: Diagnosis not present

## 2018-09-29 NOTE — Therapy (Signed)
[place], [place], [address] Phone: [phone]   Fax:  [phone]  Physical Therapy Evaluation  Patient Details  Name: Mark Frey MRN: [id number] Date of Birth: 1956-06-05 Referring Provider (PT): [name]    Encounter Date: 09/29/2018  PT End of Session - 09/29/18 1145    Visit Number  1    Number of Visits  17    Date for PT Re-Evaluation  11/28/18    PT Start Time  3545    PT Stop Time  1130    PT Time Calculation (min)  50 min    Activity Tolerance  Patient tolerated treatment well    Behavior During Therapy  [facility] for tasks assessed/performed       Past Medical History:  Diagnosis Date  . ALLERGIC RHINITIS   . Cancer ([place])    Lymphoma   . Diabetes mellitus   . Hyperlipidemia     Past Surgical History:  Procedure Laterality Date  . BIOPSY  03/15/2018   Procedure: BIOPSY;  Surgeon: Milus Banister, MD;  Location: [name];  Service: Endoscopy;;  . BOWEL RESECTION N/A 08/25/2018   Procedure: LAPROSCOPIC LOOP ILEOSTOMY;  Surgeon: Greer Pickerel, MD;  Location: [facility];  Service: General;  Laterality: N/A;  . CLEFT PALATE REPAIR    . COLONOSCOPY N/A 03/15/2018   Procedure: COLONOSCOPY;  Surgeon: Milus Banister, MD;  Location: [facility];  Service: Endoscopy;  Laterality: N/A;  . COLONOSCOPY N/A 08/20/2018   Procedure: COLONOSCOPY;  Surgeon: Irene Shipper, MD;  Location: [facility];  Service: Endoscopy;  Laterality: N/A;  . deviated septum repair     slight improvement  . ESOPHAGOGASTRODUODENOSCOPY N/A 03/15/2018   Procedure: ESOPHAGOGASTRODUODENOSCOPY (EGD);  Surgeon: Milus Banister, MD;  Location: [name];  Service: Endoscopy;  Laterality: N/A;  . IR IMAGING GUIDED PORT INSERTION  03/17/2018  . LAPAROSCOPY N/A 08/25/2018   Procedure: LAPAROSCOPY DIAGNOSTIC;  Surgeon: Greer Pickerel, MD;  Location: [facility];  Service: General;  Laterality: N/A;  . TONSILLECTOMY      There were  no vitals filed for this visit.   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  He has history of RLE and bilateral PE  He has 6 rounds of chemo and after that he had a bowel blockage that resulted in a temporarry colostomy 08/25/2018.  He is doing much better since that .  He has lost about 60 pounds  Past history included DM, past history of back pain that was resolved with chiropractics     Patient Stated Goals  to get stronger He wants to be able to squat and get up     Currently in Pain?  No/denies         [facility] PT Assessment - 09/29/18 0001      Assessment   Medical Diagnosis  bukitt's lymphoma     Referring Provider (PT)  [name]     Onset Date/Surgical Date  02/28/18    Hand Dominance  Right      Precautions   Precautions  None      Restrictions   Weight Bearing Restrictions  No      Balance Screen   Has  the patient fallen in the past 6 months  No    Has the patient had a decrease in activity level because of a fear of falling?   Yes    Is the patient reluctant to leave their home because of a fear of falling?   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 no SOB or leg pain         Posture/Postural Control   Posture/Postural Control  Postural limitations    Postural Limitations  Rounded Shoulders;Forward head      ROM / Strength   AROM / PROM / Strength  AROM;Strength      AROM   Overall AROM   Within functional limits for tasks performed      Strength   Overall Strength Comments  generally 4/5 thoughout.  Pt has  tremors with isometric strength testing that increas in amplitude with his increased effor     Right Hand Grip (lbs)  45/50/55    Left Hand Grip (lbs)  45/50/45      Bed Mobility   Bed Mobility  Rolling Right;Rolling Left;Left Sidelying to Sit;Supine to Sit    Rolling Right  Independent    Rolling Left  Independent    Left Sidelying to Sit  Independent    Supine to Sit  Independent      Transfers   Transfers  Sit to Stand      Ambulation/Gait   Ambulation/Gait  Yes    Ambulation/Gait Assistance  7: Independent   with no device      Balance   Balance Assessed  Yes      Static Standing Balance   Static Standing - Balance Support  No upper extremity supported    Static Standing - Level of Assistance  7: Independent    Static Standing - Comment/# of Minutes  pt able to stand for 10 sec with no balnce loss with feet in rhomberg position with eyes open and eyes closed, 10 sec for heel to toe with eyes open and 7 sec on left foot only       Timed Up and Go Test   TUG  Normal TUG    Normal TUG (seconds)  9.69   no arms to push off to stand                Objective measurements completed on examination: See above findings.  [facility] PT Treatment/Exercise - 09/29/18 0001      Exercises   Exercises  Other Exercises    Other Exercises   pt given [facility] [employer]  [facility]                PT Short Term Goals - 09/29/18 1152      PT SHORT TERM GOAL #1   Title  Pt will report he can do a beginning home exercise program using heart rate reserve or rate of perceived exertion as an intensity guide     Time  4    Period  Weeks    Status  New      PT SHORT TERM GOAL #2   Title  Pt will increase sit to stand in 30 seconds to 11 indicating improvment in general strength     Time  4    Period  Weeks    Status  New        PT Long Term Goals - 09/29/18 1153      PT LONG TERM GOAL #1   Title  Pt will increase sit to stand in 30 seconds to 14 indicating improvment in general strength     Baseline  9    Time  8    Period  Weeks    Status  New      PT LONG TERM GOAL #2   Title  Pt will increase averge grip strength in each hand to 70 pounds indicating an improvement in general strength     Time  8    Period  Weeks    Status  New      PT LONG TERM GOAL #3   Title  Pt will decrease TUG to < 8 seconds indicating an improvment in functional balance and mobility     Time  8    Period  Weeks    Status  New      PT LONG TERM GOAL #4   Title  Pt will be able to get down to the floor and get back up so he can get in position to do some job duties     Time  8    Period  Weeks    Status  New      PT LONG TERM GOAL #5   Title  Pt will have a plan to continue with community exercise on his own at home     Time  [place] - 09/29/18 1145    Clinical Impression Statement  Mark Frey is a 19 you who has had extensive medical treatment since June of 2019 with chemotherapy for Burkitt's lymphma, DVT and PE, and bowel obstruction resulting in a temporary colostomy 08/25/2018.  He also had had lymphedema in both legs that he feels is much better now and does not want futher treatment at this point.  He has lost 60# and feels generally weak and deconditioned.  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     History and Personal Factors relevant to plan of care:  recent abdominal surgery  history of chemo, DVT and PE     Clinical Presentation  Stable    Clinical Decision Making  Low  Rehab Potential  Excellent    Clinical Impairments Affecting Rehab Potential  colostomy     PT Frequency  2x / week    PT Duration  8 weeks    PT Treatment/Interventions  ADLs/Self Care Home Management;Gait training;Stair training;Functional mobility training;Balance training;Therapeutic exercise;Therapeutic activities;Patient/family education;Orthotic Fit/Training;Manual techniques;Taping    PT Next Visit Plan  Begin with postural exercises and general stregthening, include treadmill. check HR and calculate Heart Rate Reserve and educated about target heart rate range     Consulted and Agree with Plan of Care  Patient       Patient will benefit from skilled therapeutic intervention in order to improve the following deficits and impairments:  Postural dysfunction, Decreased mobility, Decreased activity tolerance, Decreased endurance, Decreased strength, Impaired perceived functional ability, Increased edema  Visit Diagnosis: Muscle weakness (generalized) - Plan: PT plan of care cert/re-cert  Abnormal posture - Plan: PT plan of care cert/re-cert  Lymphedema - Plan: PT plan of care cert/re-cert     Problem List Patient Active Problem List   Diagnosis Date Noted  . Abnormal CT of the abdomen   . Protein-calorie malnutrition, severe 08/19/2018  . 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 Diverticulitis of colon   . Low magnesium level 07/30/2018  . Hypokalemia 07/21/2018  . Bowel obstruction in setting of lymphoma and sigmoid stricture 07/21/2018  . Diarrhea 07/21/2018  . Burkitt lymphoma, lymph nodes of multiple sites ([place]) 05/22/2018  . Diabetes mellitus ([place])   . Edema   . Deep vein thrombosis (DVT)  of femoral vein of right lower extremity ([place])   . Counseling regarding advance care planning and goals of care 04/25/2018  . Gastrointestinal hemorrhage   . Encounter for antineoplastic chemotherapy   . 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[place]) 04/10/2018  . Port-A-Cath in place 03/27/2018  . Acute deep vein thrombosis (DVT) of femoral vein of right lower extremity ([place])   . 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 Bilateral pulmonary embolism ([place]) 03/10/2018  . Anemia 03/10/2018  . Hypoglycemia 03/10/2018  . Occult blood in stools 03/10/2018  . Abdominal mass 03/10/2018  . Tachycardia 01/31/2017  . Controlled diabetes mellitus type II without complication ([place]) 64/68/0321  . Hypertension 06/05/2014  . Irritable bowel syndrome 03/30/2010  . Morbid obesity ([place]) 12/29/2009  . Dysmetabolic syndrome X 22/48/2500  . BACK PAIN WITH RADICULOPATHY 06/09/2007  . Hyperlipidemia 05/12/2007  . ALLERGIC RHINITIS 05/12/2007   Donato Heinz. Owens Shark PT  Norwood Levo 09/29/2018, 11:57 AM  [place], [place], [address] Phone: [phone]   Fax:  [phone]  Name: Mark Frey MRN: [id number] Date of Birth: 16-Feb-1956

## 2018-10-01 ENCOUNTER — Other Ambulatory Visit (HOSPITAL_COMMUNITY): Payer: Self-pay | Admitting: Hematology

## 2018-10-03 ENCOUNTER — Ambulatory Visit: Payer: BLUE CROSS/BLUE SHIELD

## 2018-10-03 DIAGNOSIS — I890 Lymphedema, not elsewhere classified: Secondary | ICD-10-CM

## 2018-10-03 DIAGNOSIS — M6281 Muscle weakness (generalized): Secondary | ICD-10-CM

## 2018-10-03 DIAGNOSIS — R293 Abnormal posture: Secondary | ICD-10-CM

## 2018-10-03 NOTE — Patient Instructions (Addendum)
Do these in standing leaning against wall, walk feet away from wall about 6-10 inches, tightening abdominals, and shoulders and head against wall as able. Over Head Pull: Narrow and Wide Grip   Cancer Rehab [phone]   On back, knees bent, feet flat, band across thighs, elbows straight but relaxed. Pull hands apart (start). Keeping elbows straight, bring arms up and over head, hands toward floor. Keep pull steady on band. Hold momentarily. Return slowly, keeping pull steady, back to start. Then do same with a wider grip on the band (past shoulder width) Repeat _10__ times. Band color __red____   Side Pull: Double Arm   On back, knees bent, feet flat. Arms perpendicular to body, shoulder level, elbows straight but relaxed. Pull arms out to sides, elbows straight. Resistance band comes across collarbones, hands toward floor. Hold momentarily. Slowly return to starting position. Repeat _10__ times. Band color _red____   Sword   On back, knees bent, feet flat, left hand on left hip, right hand above left. Pull right arm DIAGONALLY (hip to shoulder) across chest. Bring right arm along head toward floor. Hold momentarily. Slowly return to starting position. Repeat _10__ times. Do with left arm. Band color _red____   Shoulder Rotation: Double Arm   On back, knees bent, feet flat, elbows tucked at sides, bent 90, hands palms up. Pull hands apart and down toward floor, keeping elbows near sides. Hold momentarily. Slowly return to starting position. Repeat _10__ times. Band color __red___    Duanne Guess  Walking is a great form of exercise to increase your strength, endurance and overall fitness.  A walking program can help you start slowly and gradually build endurance as you go.  Everyone's ability is different, so each person's starting point will be different.  You do not have to follow them exactly.  The are just samples. You should simply find out what's right for you and stick to that program.     In the beginning, you'll start off walking 2-3 times a day for short distances.  As you get stronger, you'll be walking further at just 1-2 times per day.  A. You Can Walk For A Certain Length Of Time Each Day    Walk 5 minutes 3 times per day.  Increase 2 minutes every 2 days (3 times per day).  Work up to 25-30 minutes (1-2 times per day).   Example:   Day 1-2 5 minutes 3 times per day   Day 7-8 12 minutes 2-3 times per day   Day 13-14 25 minutes 1-2 times per day  B. You Can Walk For a Certain Distance Each Day     Distance can be substituted for time.    Example:   3 trips to mailbox (at road)   3 trips to corner of block   3 trips around the block  C. Go to local [employer] and use the track.    Walk for distance ____ around track  Or time ____ minutes  Half Squat to Chair    Stand with feet shoulder width apart. Push buttocks backward and lower slowly, touching chair lightly and returning to standing position. Complete _1-2__ sets of __10_ repetitions. Perform _1-2__ sessions per day.   Step: Up, Anterior    Stand facing step and holding rail. Place involved leg up. Raise body using top leg only and then bring other leg up and hold for 3 sec. Step down backward, involved leg first, lower body using other leg. Repeat  __10__ times per set. Do __1-2__ sets per session. Do __1-2__ sessions per day.

## 2018-10-03 NOTE — Therapy (Signed)
[place], [place], [address] Phone: [phone]   Fax:  [phone]  Physical Therapy Treatment  Patient Details  Name: Mark Frey MRN: [id number] Date of Birth: 02-21-1956 Referring Provider (PT): [name]    Encounter Date: 10/03/2018  PT End of Session - 10/03/18 1006    Visit Number  2    Number of Visits  17    Date for PT Re-Evaluation  11/28/18    PT Start Time  0931    PT Stop Time  1022    PT Time Calculation (min)  51 min    Activity Tolerance  Patient tolerated treatment well    Behavior During Therapy  [facility] for tasks assessed/performed       Past Medical History:  Diagnosis Date  . ALLERGIC RHINITIS   . Cancer ([place])    Lymphoma   . Diabetes mellitus   . Hyperlipidemia     Past Surgical History:  Procedure Laterality Date  . BIOPSY  03/15/2018   Procedure: BIOPSY;  Surgeon: Milus Banister, MD;  Location[name];  Service: Endoscopy;;  . BOWEL RESECTION N/A 08/25/2018   Procedure: LAPROSCOPIC LOOP ILEOSTOMY;  Surgeon: Greer Pickerel, MD;  Location: [facility];  Service: General;  Laterality: N/A;  . CLEFT PALATE REPAIR    . COLONOSCOPY N/A 03/15/2018   Procedure: COLONOSCOPY;  Surgeon: Milus Banister, MD;  [facility];  Service: Endoscopy;  Laterality: N/A;  . COLONOSCOPY N/A 08/20/2018   Procedure: COLONOSCOPY;  Surgeon: Irene Shipper, MD;  [facility];  Service: Endoscopy;  Laterality: N/A;  . deviated septum repair     slight improvement  . ESOPHAGOGASTRODUODENOSCOPY N/A 03/15/2018   Procedure: ESOPHAGOGASTRODUODENOSCOPY (EGD);  Surgeon: Milus Banister, MD;  Location: [name];  Service: Endoscopy;  Laterality: N/A;  . IR IMAGING GUIDED PORT INSERTION  03/17/2018  . LAPAROSCOPY N/A 08/25/2018   Procedure: LAPAROSCOPY DIAGNOSTIC;  Surgeon: Greer Pickerel, MD;  Location: [facility];  Service: General;  Laterality: N/A;  . TONSILLECTOMY      There were  no vitals filed for this visit.  Subjective Assessment - 10/03/18 0932    Subjective  I'm walking better than I was. I'm trying to walk more and more. I need to get my strenght up so I can tolerate the bigger surgery so I can get rid of the colostomy bag. I have a doctors appt next week to determine what surgery they think I can tolerate at this point. I just really need to get my strength back.     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  He has history of RLE and bilateral PE  He has 6 rounds of chemo and after that he had a bowel blockage that resulted in a temporarry colostomy 08/25/2018.  He is doing much better since that .  He has lost about 60 pounds  Past history included DM, past history of back pain that was resolved with chiropractics     Patient Stated Goals  to get stronger He wants to be able to squat and get up     Currently in Pain?  No/denies                       [facility] Adult PT Treatment/Exercise - 10/03/18 0001      Knee/Hip Exercises: Aerobic   Tread Mill  1.5 mph x 2:30 mins, then 1.8 mph x 5:30  mins for 8 mins total (PTA present monitoring pts vitals throughout) SpO2 94-96% throughout, HR 104 before, and 106 after) RPE 3 after      Shoulder Exercises: Standing   Horizontal ABduction  Strengthening;Both;10 reps;Theraband    Theraband Level (Shoulder Horizontal ABduction)  Level 2 (Red)    External Rotation  Strengthening;Both;10 reps;Theraband    Theraband Level (Shoulder External Rotation)  Level 2 (Red)    Flexion  Strengthening;Both;10 reps;Theraband   Narrow and Wide Grip, 10 times each   Theraband Level (Shoulder Flexion)  Level 2 (Red)    Diagonals  Strengthening;Right;Left;10 reps;Theraband    Theraband Level (Shoulder Diagonals)  Level 2 (Red)             PT Education - 10/03/18 1006    Education Details  Postural strengthening and walking routine    Person(s) Educated  Patient    Methods   Explanation;Demonstration;Handout    Comprehension  Verbalized understanding;Returned demonstration;Need further instruction       PT Short Term Goals - 09/29/18 1152      PT SHORT TERM GOAL #1   Title  Pt will report he can do a beginning home exercise program using heart rate reserve or rate of perceived exertion as an intensity guide     Time  4    Period  Weeks    Status  New      PT SHORT TERM GOAL #2   Title  Pt will increase sit to stand in 30 seconds to 11 indicating improvment in general strength     Time  4    Period  Weeks    Status  New        PT Long Term Goals - 09/29/18 1153      PT LONG TERM GOAL #1   Title  Pt will increase sit to stand in 30 seconds to 14 indicating improvment in general strength     Baseline  9    Time  8    Period  Weeks    Status  New      PT LONG TERM GOAL #2   Title  Pt will increase averge grip strength in each hand to 70 pounds indicating an improvement in general strength     Time  8    Period  Weeks    Status  New      PT LONG TERM GOAL #3   Title  Pt will decrease TUG to < 8 seconds indicating an improvment in functional balance and mobility     Time  8    Period  Weeks    Status  New      PT LONG TERM GOAL #4   Title  Pt will be able to get down to the floor and get back up so he can get in position to do some job duties     Time  8    Period  Weeks    Status  New      PT LONG TERM GOAL #5   Title  Pt will have a plan to continue with community exercise on his own at home     Time  8    Period  Weeks    Status  New            Plan - 10/03/18 1009    Clinical Impression Statement  Pt tolerated first session of treadmill well and postural exercises as well as LE exercises in standing. Pt  was encouraged by todays session and is eager to begin his HEP.     Rehab Potential  Excellent    Clinical Impairments Affecting Rehab Potential  colostomy     PT Frequency  2x / week    PT Treatment/Interventions   ADLs/Self Care Home Management;Gait training;Stair training;Functional mobility training;Balance training;Therapeutic exercise;Therapeutic activities;Patient/family education;Orthotic Fit/Training;Manual techniques;Taping    PT Next Visit Plan  Cont with postural exercises and general strengthening, including the treadmill. check HR and calculate Heart Rate Reserve and educated about target heart rate range        Patient will benefit from skilled therapeutic intervention in order to improve the following deficits and impairments:  Postural dysfunction, Decreased mobility, Decreased activity tolerance, Decreased endurance, Decreased strength, Impaired perceived functional ability, Increased edema  Visit Diagnosis: Muscle weakness (generalized)  Abnormal posture  Lymphedema     Problem List Patient Active Problem List   Diagnosis Date Noted  . Abnormal CT of the abdomen   . Protein-calorie malnutrition, severe 08/19/2018  . 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 Diverticulitis of colon   . Low magnesium level 07/30/2018  . Hypokalemia 07/21/2018  . Bowel obstruction in setting of lymphoma and sigmoid stricture 07/21/2018  . Diarrhea 07/21/2018  . Burkitt lymphoma, lymph nodes of multiple sites ([place]) 05/22/2018  . Diabetes mellitus ([place])   . Edema   . Deep vein thrombosis (DVT) of femoral vein of right lower extremity ([place])   . Counseling regarding advance care planning and goals of care 04/25/2018  . Gastrointestinal hemorrhage   . Encounter for antineoplastic chemotherapy   . 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[place]) 04/10/2018  . Port-A-Cath in place 03/27/2018  . Acute deep vein thrombosis (DVT) of femoral vein of right lower extremity ([place])   . 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 Bilateral pulmonary embolism ([place]) 03/10/2018  . Anemia 03/10/2018  . Hypoglycemia 03/10/2018  . Occult blood in stools 03/10/2018  . Abdominal mass 03/10/2018   . Tachycardia 01/31/2017  . Controlled diabetes mellitus type II without complication ([place]) 68/61/6837  . Hypertension 06/05/2014  . Irritable bowel syndrome 03/30/2010  . Morbid obesity ([place]) 12/29/2009  . Dysmetabolic syndrome X 29/10/1113  . BACK PAIN WITH RADICULOPATHY 06/09/2007  . Hyperlipidemia 05/12/2007  . ALLERGIC RHINITIS 05/12/2007    Otelia Limes, PTA 10/03/2018, 10:32 AM  [place], [place], [address] Phone: [phone]   Fax:  [phone]  Name: DEMETRIAS GOODBAR MRN: [id number] Date of Birth: January 08, 1956

## 2018-10-05 ENCOUNTER — Ambulatory Visit (HOSPITAL_COMMUNITY)
Admission: RE | Admit: 2018-10-05 | Discharge: 2018-10-05 | Disposition: A | Discharge status: Home / Self Care | Payer: BLUE CROSS/BLUE SHIELD | Source: Ambulatory Visit | Attending: Hematology | Admitting: Hematology

## 2018-10-05 ENCOUNTER — Encounter: Payer: Self-pay | Admitting: Physical Therapy

## 2018-10-05 ENCOUNTER — Ambulatory Visit: Payer: BLUE CROSS/BLUE SHIELD | Admitting: Physical Therapy

## 2018-10-05 ENCOUNTER — Encounter (HOSPITAL_COMMUNITY): Payer: Self-pay

## 2018-10-05 DIAGNOSIS — C8378 Burkitt lymphoma, lymph nodes of multiple sites: Secondary | ICD-10-CM

## 2018-10-05 DIAGNOSIS — M6281 Muscle weakness (generalized): Secondary | ICD-10-CM | POA: Diagnosis not present

## 2018-10-05 DIAGNOSIS — K5730 Diverticulosis of large intestine without perforation or abscess without bleeding: Secondary | ICD-10-CM | POA: Diagnosis not present

## 2018-10-05 DIAGNOSIS — K56600 Partial intestinal obstruction, unspecified as to cause: Secondary | ICD-10-CM | POA: Diagnosis not present

## 2018-10-05 DIAGNOSIS — R293 Abnormal posture: Secondary | ICD-10-CM

## 2018-10-05 DIAGNOSIS — I890 Lymphedema, not elsewhere classified: Secondary | ICD-10-CM | POA: Diagnosis not present

## 2018-10-05 MED ORDER — HEPARIN SOD (PORK) LOCK FLUSH 100 UNIT/ML IV SOLN: 500.0000 [IU] | Freq: Once | INTRAVENOUS | Status: DC

## 2018-10-05 MED ORDER — SODIUM CHLORIDE (PF) 0.9 % IJ SOLN
INTRAMUSCULAR | Status: AC
  Filled 2018-10-05: qty 50

## 2018-10-05 MED ORDER — IOHEXOL 300 MG/ML  SOLN
100.0000 mL | Freq: Once | INTRAMUSCULAR | Status: AC | PRN
  Administered 2018-10-05: 100 mL via INTRAVENOUS

## 2018-10-05 MED ORDER — HEPARIN SOD (PORK) LOCK FLUSH 100 UNIT/ML IV SOLN
INTRAVENOUS | Status: AC
  Administered 2018-10-05: 500 [IU]
  Filled 2018-10-05: qty 5

## 2018-10-05 NOTE — Patient Instructions (Signed)
Moderate exercise target HR range  135-142  With resting HR of 120

## 2018-10-05 NOTE — Progress Notes (Addendum)
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  Mark Frey presented with symptoms of: It was an incidental finding when patient presented with a DVT.  Biopsies of RLQ peritoneum revealed: High grade B-cell lymphoma.   Past/Anticipated interventions by medical oncology, if any:  09/21/18 [name] PLAN: -Will repeat CT to follow up on previous area of thickening seen on last CT from 08/23/18. -Will also refer the pt to Rad Onc for discussion and ISRT considerationfor Burkitts lymphoma with bulky disease s/p chemotherapy -Will consider percutaneous biopsy if necessary and possible  -Will refer the pt to outpatient PT which he does prefer -Will refer the pt to our nutritional therapist Ernestene Kiel -Will see the pt back in 3 weeks  CT abd/pelvis in 2 weeks Radiation oncology referral in 2 weeks post CT RTC with dr [name] in 3 weeks with labs Referral to outpatient cancer rehab center Referral to [facility]/nutritional therapy in 3-4 days   Weight changes, if any, over the past 6 months: He has lost about 60 lbs since starting chemotherapy and his bowel surgery, but is slowly gaining it back.   Recurrent fevers, or drenching night sweats, if any: No  SAFETY ISSUES:  Prior radiation? No  Pacemaker/ICD? No  Possible current pregnancy?  No  Is the patient on methotrexate? No  Current Complaints / other details:    BP 130/86 (BP Location: Left Arm, Patient Position: Sitting)   Pulse 97   Temp 97.7 F (36.5 C) (Oral)   Resp 18   Ht 5' 9"  (1.753 m)   Wt 214 lb 2 oz (97.1 kg)   SpO2 100%   BMI 31.62 kg/m    Wt Readings from Last 3 Encounters:  10/10/18 214 lb 2 oz (97.1 kg)  09/21/18 210 lb 12.8 oz (95.6 kg)  08/28/18 182 lb 15.7 oz (83 kg)

## 2018-10-05 NOTE — Therapy (Signed)
[place], [place], [address] Phone: [phone]   Fax:  [phone]  Physical Therapy Treatment  Patient Details  Name: Mark Frey MRN: [id number] Date of Birth: 11/16/1955 Referring Provider (PT): [name]    Encounter Date: 10/05/2018  PT End of Session - 10/05/18 1700    Visit Number  3    Number of Visits  17    Date for PT Re-Evaluation  11/28/18    PT Start Time  7062    PT Stop Time  1600    PT Time Calculation (min)  45 min    Activity Tolerance  Patient tolerated treatment well    Behavior During Therapy  [facility] for tasks assessed/performed       Past Medical History:  Diagnosis Date  . ALLERGIC RHINITIS   . Cancer ([place])    Lymphoma   . Diabetes mellitus   . Hyperlipidemia     Past Surgical History:  Procedure Laterality Date  . BIOPSY  03/15/2018   Procedure: BIOPSY;  Surgeon: Milus Banister, MD;  Location: [name];  Service: Endoscopy;;  . BOWEL RESECTION N/A 08/25/2018   Procedure: LAPROSCOPIC LOOP ILEOSTOMY;  Surgeon: Greer Pickerel, MD;  Location: [facility];  Service: General;  Laterality: N/A;  . CLEFT PALATE REPAIR    . COLONOSCOPY N/A 03/15/2018   Procedure: COLONOSCOPY;  Surgeon: Milus Banister, MD;  [facility];  Service: Endoscopy;  Laterality: N/A;  . COLONOSCOPY N/A 08/20/2018   Procedure: COLONOSCOPY;  Surgeon: Irene Shipper, MD;  Location: [facility];  Service: Endoscopy;  Laterality: N/A;  . deviated septum repair     slight improvement  . ESOPHAGOGASTRODUODENOSCOPY N/A 03/15/2018   Procedure: ESOPHAGOGASTRODUODENOSCOPY (EGD);  Surgeon: Milus Banister, MD;  Location: [name];  Service: Endoscopy;  Laterality: N/A;  . IR IMAGING GUIDED PORT INSERTION  03/17/2018  . LAPAROSCOPY N/A 08/25/2018   Procedure: LAPAROSCOPY DIAGNOSTIC;  Surgeon: Greer Pickerel, MD;  [facility]: [facility];  Service: General;  Laterality: N/A;  . TONSILLECTOMY      There were  no vitals filed for this visit.  Subjective Assessment - 10/05/18 1528    Subjective  Pt states his heart rate always high. His personal physician knows about it and is unconcerned.  He ususally runs around 120   He hasn't been on the treadmill yet at home, but plans to .  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  He has history of RLE and bilateral PE  He has 6 rounds of chemo and after that he had a bowel blockage that resulted in a temporarry colostomy 08/25/2018.  He is doing much better since that .  He has lost about 60 pounds  Past history included DM, past history of back pain that was resolved with chiropractics     Patient Stated Goals  to get stronger He wants to be able to squat and get up     Currently in Pain?  No/denies                       [facility] Adult PT Treatment/Exercise - 10/05/18 0001      Exercises   Exercises  Shoulder;Lumbar;Knee/Hip;Ankle      Knee/Hip Exercises: Aerobic   Tread Mill  1.8 mph x 5 min RPE 3 afterHR 133 after treadmill ( mod intesnitiy HR range 135-142  Knee/Hip Exercises: Standing   Heel Raises  Both;10 reps    Lateral Step Up  Right;Left;5 reps;Step Height: 6"   holding 2 dowels for balance    Forward Step Up  Right;Left;5 reps;Step Height: 6"   2 dowels for balance    Wall Squat  10 reps   wall slides for quad work    Other Standing Knee Exercises  partial lunges 5 reps on each leg     Other Standing Knee Exercises  10 reps of squats from mat       Shoulder Exercises: Standing   External Rotation  Strengthening;Right;Left;5 reps;Theraband    Theraband Level (Shoulder External Rotation)  Level 4 (Blue)    Row  Strengthening;Right;Left;20 reps;Theraband    Theraband Level (Shoulder Row)  Level 4 (Blue)    Row Limitations  10 reps bilateral, 10 reps alternating     Other Standing Exercises  bicep curls 5# 10 reps on each side                 PT Short Term Goals - 09/29/18 1152      PT SHORT TERM GOAL #1   Title  Pt will report he can do a beginning home exercise program using heart rate reserve or rate of perceived exertion as an intensity guide     Time  4    Period  Weeks    Status  New      PT SHORT TERM GOAL #2   Title  Pt will increase sit to stand in 30 seconds to 11 indicating improvment in general strength     Time  4    Period  Weeks    Status  New        PT Long Term Goals - 09/29/18 1153      PT LONG TERM GOAL #1   Title  Pt will increase sit to stand in 30 seconds to 14 indicating improvment in general strength     Baseline  9    Time  8    Period  Weeks    Status  New      PT LONG TERM GOAL #2   Title  Pt will increase averge grip strength in each hand to 70 pounds indicating an improvement in general strength     Time  8    Period  Weeks    Status  New      PT LONG TERM GOAL #3   Title  Pt will decrease TUG to < 8 seconds indicating an improvment in functional balance and mobility     Time  8    Period  Weeks    Status  New      PT LONG TERM GOAL #4   Title  Pt will be able to get down to the floor and get back up so he can get in position to do some job duties     Time  8    Period  Weeks    Status  New      PT LONG TERM GOAL #5   Title  Pt will have a plan to continue with community exercise on his own at home     Time  8    Period  Weeks    Status  New            Plan - 10/05/18 1701    Clinical Impression Statement  pt has a high resting HR that is well known  and he is not concerned.  Calculated target HR ( 135-142) with HRR method and correlated it to RPE.  Added LE and UE strengthening an    Clinical Impairments Affecting Rehab Potential  colostomy     PT Frequency  2x / week    PT Duration  8 weeks    PT Treatment/Interventions  ADLs/Self Care Home Management;Gait training;Stair training;Functional mobility training;Balance training;Therapeutic  exercise;Therapeutic activities;Patient/family education;Orthotic Fit/Training;Manual techniques;Taping    PT Next Visit Plan  treadmill x 10 min, check HR  Do strengthening exercise as circut with step ups, rows, curls heel lifts     Recommended Other Services  Target HT between 135-142    Consulted and Agree with Plan of Care  Patient       Patient will benefit from skilled therapeutic intervention in order to improve the following deficits and impairments:  Postural dysfunction, Decreased mobility, Decreased activity tolerance, Decreased endurance, Decreased strength, Impaired perceived functional ability, Increased edema  Visit Diagnosis: Abnormal posture  Muscle weakness (generalized)  Lymphedema     Problem List Patient Active Problem List   Diagnosis Date Noted  . Abnormal CT of the abdomen   . Protein-calorie malnutrition, severe 08/19/2018  . 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 Diverticulitis of colon   . Low magnesium level 07/30/2018  . Hypokalemia 07/21/2018  . Bowel obstruction in setting of lymphoma and sigmoid stricture 07/21/2018  . Diarrhea 07/21/2018  . Burkitt lymphoma, lymph nodes of multiple sites ([place]) 05/22/2018  . Diabetes mellitus ([place])   . Edema   . Deep vein thrombosis (DVT) of femoral vein of right lower extremity ([place])   . Counseling regarding advance care planning and goals of care 04/25/2018  . Gastrointestinal hemorrhage   . Encounter for antineoplastic chemotherapy   . 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 Port-A-Cath in place 03/27/2018  . Acute deep vein thrombosis (DVT) of femoral vein of right lower extremity ([place])   . 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 Bilateral pulmonary embolism ([place]) 03/10/2018  . Anemia 03/10/2018  . Hypoglycemia 03/10/2018  . Occult blood in stools 03/10/2018  . Abdominal mass 03/10/2018  . Tachycardia 01/31/2017  . Controlled diabetes mellitus type II  without complication ([place]) 97/94/8016  . Hypertension 06/05/2014  . Irritable bowel syndrome 03/30/2010  . Morbid obesity ([place]) 12/29/2009  . Dysmetabolic syndrome X 55/37/4827  . BACK PAIN WITH RADICULOPATHY 06/09/2007  . Hyperlipidemia 05/12/2007  . ALLERGIC RHINITIS 05/12/2007   Donato Heinz. Owens Shark PT  Norwood Levo 10/05/2018, 5:05 PM  [place], [place], [address] Phone: [phone]   Fax:  [phone]  Name: JANI PLOEGER MRN: [id number] Date of Birth: Feb 12, 1956

## 2018-10-10 ENCOUNTER — Other Ambulatory Visit: Payer: Self-pay

## 2018-10-10 ENCOUNTER — Ambulatory Visit
Admission: RE | Admit: 2018-10-10 | Discharge: 2018-10-10 | Disposition: A | Discharge status: Home / Self Care | Payer: BLUE CROSS/BLUE SHIELD | Source: Ambulatory Visit | Attending: Radiation Oncology | Admitting: Radiation Oncology

## 2018-10-10 ENCOUNTER — Encounter: Payer: Self-pay | Admitting: Radiation Oncology

## 2018-10-10 ENCOUNTER — Ambulatory Visit: Payer: BLUE CROSS/BLUE SHIELD | Admitting: Physical Therapy

## 2018-10-10 ENCOUNTER — Encounter: Payer: Self-pay | Admitting: Physical Therapy

## 2018-10-10 VITALS — BP 130/86 | HR 97 | Temp 97.7°F | Resp 18 | Ht 69.0 in | Wt 214.1 lb

## 2018-10-10 DIAGNOSIS — C8510 Unspecified B-cell lymphoma, unspecified site: Secondary | ICD-10-CM

## 2018-10-10 DIAGNOSIS — C8583 Other specified types of non-Hodgkin lymphoma, intra-abdominal lymph nodes: Secondary | ICD-10-CM | POA: Diagnosis not present

## 2018-10-10 DIAGNOSIS — E785 Hyperlipidemia, unspecified: Secondary | ICD-10-CM | POA: Diagnosis not present

## 2018-10-10 DIAGNOSIS — N400 Enlarged prostate without lower urinary tract symptoms: Secondary | ICD-10-CM | POA: Diagnosis not present

## 2018-10-10 DIAGNOSIS — E119 Type 2 diabetes mellitus without complications: Secondary | ICD-10-CM | POA: Insufficient documentation

## 2018-10-10 DIAGNOSIS — R293 Abnormal posture: Secondary | ICD-10-CM

## 2018-10-10 DIAGNOSIS — K6389 Other specified diseases of intestine: Secondary | ICD-10-CM | POA: Diagnosis not present

## 2018-10-10 DIAGNOSIS — K5730 Diverticulosis of large intestine without perforation or abscess without bleeding: Secondary | ICD-10-CM | POA: Diagnosis not present

## 2018-10-10 DIAGNOSIS — N200 Calculus of kidney: Secondary | ICD-10-CM | POA: Insufficient documentation

## 2018-10-10 DIAGNOSIS — Z79899 Other long term (current) drug therapy: Secondary | ICD-10-CM | POA: Insufficient documentation

## 2018-10-10 DIAGNOSIS — M6281 Muscle weakness (generalized): Secondary | ICD-10-CM

## 2018-10-10 DIAGNOSIS — M7989 Other specified soft tissue disorders: Secondary | ICD-10-CM | POA: Insufficient documentation

## 2018-10-10 DIAGNOSIS — Z801 Family history of malignant neoplasm of trachea, bronchus and lung: Secondary | ICD-10-CM | POA: Insufficient documentation

## 2018-10-10 DIAGNOSIS — Z809 Family history of malignant neoplasm, unspecified: Secondary | ICD-10-CM | POA: Insufficient documentation

## 2018-10-10 DIAGNOSIS — Z7901 Long term (current) use of anticoagulants: Secondary | ICD-10-CM | POA: Insufficient documentation

## 2018-10-10 DIAGNOSIS — Z9221 Personal history of antineoplastic chemotherapy: Secondary | ICD-10-CM | POA: Diagnosis not present

## 2018-10-10 DIAGNOSIS — Z932 Ileostomy status: Secondary | ICD-10-CM | POA: Diagnosis not present

## 2018-10-10 DIAGNOSIS — I890 Lymphedema, not elsewhere classified: Secondary | ICD-10-CM

## 2018-10-10 NOTE — Progress Notes (Signed)
Radiation Oncology         [phone] ________________________________  Initial outpatient Consultation  Name: Mark Frey MRN: [id number]  Date: 10/10/2018  DOB: 01-Sep-1956  HF:WYOVZC, [name], MD  [name], MD   REFERRING PHYSICIAN: [name], MD  DIAGNOSIS:    ICD-10-CM   1. 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 male who presented to the ED with right leg swelling. Work up including Chest/abdomen/pelvis CT scan on 03/10/2018 revealed DVT as well as a large mass measuring about 18.7 cm in the right lower quadrant of abdomen, which infiltrated and incased several small bowel loops. Biopsy of the colon tumor on 03/15/2018 showed high grade non-Hodgkin lymphoma. He was started on chemotherapy under [name] in early July 2019. Initial PET scan on 04/18/2018 showed definite improvement in the bowel. He completed chemotherapy on 07/03/2018. Follow up PET on 07/28/2018 showed persistent colon thickening. He began to experience intermittent bowel obstruction with severe malnutrition warranting hospitalization  and underwent colonoscopy on 08/20/2018 and laparoscopy on 08/25/2018. Ileostomy placed. Per patient, biopsy was unable to be obtained at the time due to patient's weakness/frail medical status.  It's still not clear from -oscopies and imaging if his bowel has been thickened and inflamed from treatment effects or persistent disease. Since the ileostomy, his overall nutrition health has greatly improved. Per referral by [name] on 09/21/2018, the patient is being seen by nutritional management and  physical therapy. Most recent CT abdomen scan on 10/05/2018 shows persistent thickening roughly similar to prior scan. I reviewed the imaging with pt and significant other in detail today.  The patient has been kindly referred to [place] for discussion of potential radiation therapy treatment options.  Please see onc history below for more details on prior workup and treatment.  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  Based on the IHC studies, the differential diagnosis includes diffuse large B-cell lymphoma, Burkitt lymphoma as well as but can like lymphoma.     03/15/2018 Initial Diagnosis    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[place])    03/15/2018 Procedure    Colonoscopy showed a large tumor in the proximal ascending colon, likely from the direct invasion of his abdominal lymphoma.  Biopsy confirmed high-grade B-cell lymphoma.    03/17/2018 -  Chemotherapy    R-EPOCH every 3 weeks    03/17/2018 Procedure    Bone marrow biopsy was negative for lymphoma involvement.    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 He received intrathecal methotrexate.  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       PREVIOUS RADIATION THERAPY: No  PAST MEDICAL HISTORY:  has a past medical history of ALLERGIC RHINITIS, Cancer ([place]), Diabetes mellitus, and Hyperlipidemia.    PAST SURGICAL HISTORY: Past Surgical History:  Procedure Laterality Date  . BIOPSY  03/15/2018   Procedure: BIOPSY;  Surgeon: Milus Banister, MD;  Location[name];  Service: Endoscopy;;  . BOWEL RESECTION N/A 08/25/2018   Procedure: LAPROSCOPIC LOOP ILEOSTOMY;  Surgeon: Greer Pickerel, MD;  Location: [facility];  Service: General;  Laterality: N/A;  . CLEFT PALATE REPAIR    . COLONOSCOPY N/A 03/15/2018   Procedure: COLONOSCOPY;  Surgeon: Milus Banister, MD;  [facility];  Service: Endoscopy;  Laterality: N/A;  . COLONOSCOPY N/A 08/20/2018   Procedure: COLONOSCOPY;  Surgeon: Irene Shipper, MD;  Location: [facility];  Service: Endoscopy;  Laterality: N/A;  . deviated septum repair     slight improvement  . ESOPHAGOGASTRODUODENOSCOPY N/A 03/15/2018   Procedure: ESOPHAGOGASTRODUODENOSCOPY (EGD);  Surgeon: Milus Banister, MD;  Location: [name];  Service: Endoscopy;  Laterality: N/A;  . IR IMAGING GUIDED PORT INSERTION  03/17/2018  . LAPAROSCOPY N/A 08/25/2018   Procedure: LAPAROSCOPY DIAGNOSTIC;  Surgeon: Greer Pickerel, MD;  Location: [facility];  Service: General;  Laterality: N/A;  . TONSILLECTOMY      FAMILY HISTORY: family history includes AAA (abdominal aortic aneurysm) in his father; Brain cancer in his mother; Lung cancer in his mother.  SOCIAL HISTORY:  reports that he has never smoked. He has never used smokeless tobacco. He reports current alcohol use. He reports that he does not use  drugs.  ALLERGIES: Ciprofloxacin  MEDICATIONS:  Current Outpatient Medications  Medication Sig Dispense Refill  . acetaminophen (TYLENOL) 325 MG tablet Take 2 tablets (650 mg total) by mouth every 6 (six) hours as needed.    . blood glucose meter kit and supplies Dispense based on patient and insurance preference. Use daily as directed. (E11.9). 1 each 0  . ELIQUIS 5 MG TABS tablet TAKE 1 TABLET BY MOUTH TWICE A DAY 60 tablet 3  . glucose blood (FREESTYLE TEST STRIPS) test strip Use to check blood sugar daily 100 each 4  . JANUMET 50-1000 MG tablet TAKE 1 TABLET TWICE DAILY  WITH MEALS 180 tablet 3  . Magnesium 500 MG CAPS Take 1 capsule (500 mg total) by mouth 2 (two) times daily. 60 capsule 0  . Probiotic Product (PROBIOTIC-10) CAPS Take 1 capsule by mouth daily.    [name] lidocaine-prilocaine (EMLA) cream Apply 1 application topically as needed. (Patient not taking: Reported on 09/29/2018) 30 g 2   No current facility-administered medications for this encounter.     REVIEW OF SYSTEMS:  Notable for that above.   PHYSICAL EXAM:  height is 5' 9"  (1.753 m) and weight is 214 lb 2 oz (97.1 kg). His oral  temperature is 97.7 F (36.5 C). His blood pressure is 130/86 and his pulse is 97. His respiration is 18 and oxygen saturation is 100%.   General: Alert and oriented, in no acute distress HEENT: Head is normocephalic. Extraocular movements are intact. Oropharynx is clear. He does not have a visible uvula. Roof of his mouth shows postoperative changes from cleft palate surgery Heart: Regular in rate and rhythm with no murmurs, rubs, or gallops. Chest: Clear to auscultation bilaterally, with no rhonchi, wheezes, or rales.  Abdomen: Soft, nontender, nondistended, with no rigidity or guarding. He has bandaging around his right ileostomy, which appears clean and intact. Extremities: No cyanosis. Edema noted in bilateral ankles and feet. Vascular: Right upper chest port-a-cath in place Musculoskeletal:  symmetric strength and muscle tone throughout. Neurologic: Cranial nerves II through XII are grossly intact. No obvious focalities. Speech is fluent. Coordination is intact. Psychiatric: Judgment and insight are intact. Affect is appropriate.   ECOG = 1  0 - Asymptomatic (Fully active, able to carry on all predisease activities without restriction)  1 - Symptomatic but completely ambulatory (Restricted in physically strenuous activity but ambulatory and able to carry out work of a light or sedentary nature. For example, light housework, office work)  2 - Symptomatic, <50% in bed during the day (Ambulatory and capable of all self care but unable to carry out any work activities. Up and about more than 50% of waking hours)  3 - Symptomatic, >50% in bed, but not bedbound (Capable of only limited self-care, confined to bed or chair 50% or more of waking hours)  4 - Bedbound (Completely disabled. Cannot carry on any self-care. Totally confined to bed or chair)  5 - Death   Eustace Pen MM, Creech RH, Tormey DC, et al. [phone]). "Toxicity and response criteria of the [facility] Group". [place] Oncol. 5 (6): 649-55   LABORATORY DATA:  Lab Results  Component Value Date   WBC 4.9 09/28/2018   HGB 11.1 (L) 09/28/2018   HCT 35.9 (L) 09/28/2018   MCV 104.7 (H) 09/28/2018   PLT 203 09/28/2018   CMP     Component Value Date/Time   NA 144 09/28/2018 1055   K 4.5 09/28/2018 1055   CL 108 09/28/2018 1055   CO2 26 09/28/2018 1055   GLUCOSE 91 09/28/2018 1055   BUN 12 09/28/2018 1055   CREATININE 0.77 09/28/2018 1055   CREATININE 1.26 12/01/2012 1700   CALCIUM 9.9 09/28/2018 1055   PROT 7.4 09/28/2018 1055   ALBUMIN 4.2 09/28/2018 1055   AST 28 09/28/2018 1055   ALT 34 09/28/2018 1055   ALKPHOS 84 09/28/2018 1055   BILITOT 0.3 09/28/2018 1055   GFRNONAA >60 09/28/2018 1055   GFRAA >60 09/28/2018 1055         RADIOGRAPHY: Ct Abdomen Pelvis W Contrast  Result Date:  10/05/2018 CLINICAL DATA:  Burkitt lymphoma diagnosed June 2019, chemotherapy complete. Prior colostomy. Restaging assessment. EXAM: CT ABDOMEN AND PELVIS WITH CONTRAST TECHNIQUE: Multidetector CT imaging of the abdomen and pelvis was performed using the standard protocol following bolus administration of intravenous contrast. CONTRAST:  131m OMNIPAQUE IOHEXOL 300 MG/ML  SOLN COMPARISON:  08/22/2018 FINDINGS: Lower chest: Mild scarring both lower lobes. The degree of surrounding atelectasis is reduced compared to 08/22/2018. Hepatobiliary: 5 mm (image 11/2) and 4 mm (image 35/2 hypodense lesions in the right hepatic lobe are stable. Gallbladder unremarkable. No biliary dilatation. Pancreas: Unremarkable Spleen: Unremarkable Adrenals/Urinary Tract: Stable small hypodense renal lesions are  technically too small to characterize although statistically likely to be benign. 6 mm nonobstructive right mid kidney calculus, image 57/6. 2 mm right kidney upper pole calculus, image 64/6. No other urinary tract calculi are observed. The prior right ureteral calculus has cleared. Stomach/Bowel: Interval loop enterostomy in the right lower quadrant. There is some potential wall thickening in the terminal ileum on images 56 through 62 of series 2. Nondistended colon. Scattered descending and proximal sigmoid colon diverticula. Wall thickening in the sigmoid colon. Mild stranding around the terminal ileum and lower omentum, similar to the prior exam. Vascular/Lymphatic: Aortoiliac atherosclerotic vascular disease. Reproductive: Moderate prostatomegaly. Stable calcifications in the prostate gland. Other: No supplemental non-categorized findings. Musculoskeletal: Mild left foraminal stenosis at L4-5 due to facet arthropathy and disc bulge. IMPRESSION: 1. Persistent wall thickening in the sigmoid colon although a component of this could be due to diverticulosis. There is also a suggestion of wall thickening and potential mucosal  enhancement in the terminal ileum, as well as adjacent indistinct stranding in the mesenteric and omental adipose tissues roughly similar to the prior exam. Some of this may be treatment related. Inflammatory process such as Crohn's disease not excluded. 2. No current adenopathy. 3. Other imaging findings of potential clinical significance: Nonobstructive right nephrolithiasis. Aortic Atherosclerosis (ICD10-I70.0). Moderate prostatomegaly. Left foraminal impingement at L4-5. Electronically Signed   By: Van Clines M.D.   On: 10/05/2018 13:58      IMPRESSION/PLAN: High Grade NHL  We discussed the patient's current workup and recent abdomen/pelvis CT scan on 10/05/2018. I will need to speak w/ [name] to explore with him whether a laparoscopic biopsy is advised, and/or whether a second opinion at a university-based cancer center would be prudent at this point. He may benefit from further chemotherapy if he has residual lymphoma, and it's possible he could be eligible for a clinical trial at another center. Radiation could certainly be offered for palliation / local control, but this could carry various potential toxicities; I hesitate to start that now without a clear understanding of what is causing the inflammation in his abdomen and bowel thickening/obstruction and without understanding whether further chemotherapy would be in his best interest for possible residual disease. All of these issues were discussed with the patient and his significant other and they are in agreement with this plan. I gave them my contact information and they understand that no further appointments will be scheduled in radiation oncology until his final disposition is determined with [name] on 10/13/2018.  I also left a message in the EMR for Dr [name] in hopes that we can speak before 1/31 about this interesting and unusual case.  GI tumor board referral has been made per the patient's understanding - will confirm with Dr  [name].  I spent 55 minutes  face to face with the patient and more than 50% of that time was spent in counseling and/or coordination of care.    __________________________________________   [name], MD  This document serves as a record of services personally performed by [name], MD. It was created on her behalf by Wilburn Mylar, a trained medical scribe. The creation of this record is based on the scribe's personal observations and the provider's statements to them. This document has been checked and approved by the attending provider.

## 2018-10-10 NOTE — Therapy (Signed)
[place], [place], [address] Phone: [phone]   Fax:  [phone]  Physical Therapy Treatment  Patient Details  Name: Mark Frey MRN: [id number] Date of Birth: 04-07-1956 Referring Provider (PT): [name]    Encounter Date: 10/10/2018  PT End of Session - 10/10/18 1524    Visit Number  4    Number of Visits  17    Date for PT Re-Evaluation  11/28/18    PT Start Time  7902    PT Stop Time  4097    PT Time Calculation (min)  43 min    Behavior During Therapy  [facility] for tasks assessed/performed       Past Medical History:  Diagnosis Date  . ALLERGIC RHINITIS   . Cancer ([place])    Lymphoma   . Diabetes mellitus   . Hyperlipidemia     Past Surgical History:  Procedure Laterality Date  . BIOPSY  03/15/2018   Procedure: BIOPSY;  Surgeon: Milus Banister, MD;  Location: [name];  Service: Endoscopy;;  . BOWEL RESECTION N/A 08/25/2018   Procedure: LAPROSCOPIC LOOP ILEOSTOMY;  Surgeon: Greer Pickerel, MD;  Location: [facility];  Service: General;  Laterality: N/A;  . CLEFT PALATE REPAIR    . COLONOSCOPY N/A 03/15/2018   Procedure: COLONOSCOPY;  Surgeon: Milus Banister, MD;  [facility];  Service: Endoscopy;  Laterality: N/A;  . COLONOSCOPY N/A 08/20/2018   Procedure: COLONOSCOPY;  Surgeon: Irene Shipper, MD;  Location: [facility];  Service: Endoscopy;  Laterality: N/A;  . deviated septum repair     slight improvement  . ESOPHAGOGASTRODUODENOSCOPY N/A 03/15/2018   Procedure: ESOPHAGOGASTRODUODENOSCOPY (EGD);  Surgeon: Milus Banister, MD;  Location: [name];  Service: Endoscopy;  Laterality: N/A;  . IR IMAGING GUIDED PORT INSERTION  03/17/2018  . LAPAROSCOPY N/A 08/25/2018   Procedure: LAPAROSCOPY DIAGNOSTIC;  Surgeon: Greer Pickerel, MD;  [facility]: [facility];  Service: General;  Laterality: N/A;  . TONSILLECTOMY      There were no vitals filed for this visit.  Subjective Assessment  - 10/10/18 1524    Subjective  Good!    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  He has history of RLE and bilateral PE  He has 6 rounds of chemo and after that he had a bowel blockage that resulted in a temporarry colostomy 08/25/2018.  He is doing much better since that .  He has lost about 60 pounds  Past history included DM, past history of back pain that was resolved with chiropractics     Currently in Pain?  No/denies                       [facility] Adult PT Treatment/Exercise - 10/10/18 0001      Exercises   Exercises  Shoulder;Lumbar;Knee/Hip      Knee/Hip Exercises: Aerobic   Tread Mill  1.8 mph x 10 min RPE 3 afterHR 133 after treadmill ( mod intesnitiy HR range 135-142   RHR 120, EHR 123     Knee/Hip Exercises: Standing   Heel Raises  Both;10 reps    Forward Lunges  Both;1 set;5 reps    Forward Lunges Limitations  step back, lunge , bring it forward to knee up while holding onto back of bike     Hip Abduction  Stengthening;Right;Left;10 reps    Abduction Limitations   theraband aound ankles    Forward Step Up  Right;Left;10 reps;Step Height: 6"    Other Standing Knee Exercises  monster walks x 100 feet with red theraband       Shoulder Exercises: Standing   Flexion  Strengthening;Right;Left;10 reps    Shoulder Flexion Weight (lbs)  5    ABduction  Strengthening;Right;Left;10 reps;Weights    Shoulder ABduction Weight (lbs)  5    Extension  Strengthening;Right;Left;10 reps;Theraband    Theraband Level (Shoulder Extension)  Level 3 (Green)    Row  Strengthening;Right;Left;10 reps;Theraband    Theraband Level (Shoulder Row)  Level 3 (Green)    Other Standing Exercises  bicep curls 5# 10 reps on each side     Other Standing Exercises  10 # one arm carry with cues to tuck arm at side                PT Short Term Goals - 09/29/18 1152      PT SHORT TERM GOAL #1   Title  Pt will report he can do a beginning  home exercise program using heart rate reserve or rate of perceived exertion as an intensity guide     Time  4    Period  Weeks    Status  New      PT SHORT TERM GOAL #2   Title  Pt will increase sit to stand in 30 seconds to 11 indicating improvment in general strength     Time  4    Period  Weeks    Status  New        PT Long Term Goals - 09/29/18 1153      PT LONG TERM GOAL #1   Title  Pt will increase sit to stand in 30 seconds to 14 indicating improvment in general strength     Baseline  9    Time  8    Period  Weeks    Status  New      PT LONG TERM GOAL #2   Title  Pt will increase averge grip strength in each hand to 70 pounds indicating an improvement in general strength     Time  8    Period  Weeks    Status  New      PT LONG TERM GOAL #3   Title  Pt will decrease TUG to < 8 seconds indicating an improvment in functional balance and mobility     Time  8    Period  Weeks    Status  New      PT LONG TERM GOAL #4   Title  Pt will be able to get down to the floor and get back up so he can get in position to do some job duties     Time  8    Period  Weeks    Status  New      PT LONG TERM GOAL #5   Title  Pt will have a plan to continue with community exercise on his own at home     Time  8    Period  Weeks    Status  New            Plan - 10/10/18 1659    Clinical Impression Statement  Pt seems to be doing better and is tolerating exercise well.      Rehab Potential  Excellent    Clinical Impairments Affecting Rehab Potential  colostomy     PT Frequency  2x / week  PT Duration  8 weeks    PT Treatment/Interventions  ADLs/Self Care Home Management;Gait training;Stair training;Functional mobility training;Balance training;Therapeutic exercise;Therapeutic activities;Patient/family education;Orthotic Fit/Training;Manual techniques;Taping    PT Next Visit Plan  treadmill x 12 min, check HR  Do strengthening exercise as circut with step ups, rows, curls  heel lifts vs consider taking to other side to work on Scientist, research (physical sciences) with Plan of Care  Patient       Patient will benefit from skilled therapeutic intervention in order to improve the following deficits and impairments:  Postural dysfunction, Decreased mobility, Decreased activity tolerance, Decreased endurance, Decreased strength, Impaired perceived functional ability, Increased edema  Visit Diagnosis: Abnormal posture  Muscle weakness (generalized)  Lymphedema     Problem List Patient Active Problem List   Diagnosis Date Noted  . Abnormal CT of the abdomen   . Protein-calorie malnutrition, severe 08/19/2018  . 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 Diverticulitis of colon   . Low magnesium level 07/30/2018  . Hypokalemia 07/21/2018  . Bowel obstruction in setting of lymphoma and sigmoid stricture 07/21/2018  . Diarrhea 07/21/2018  . Burkitt lymphoma, lymph nodes of multiple sites ([place]) 05/22/2018  . Diabetes mellitus ([place])   . Edema   . Deep vein thrombosis (DVT) of femoral vein of right lower extremity ([place])   . Counseling regarding advance care planning and goals of care 04/25/2018  . Gastrointestinal hemorrhage   . Encounter for antineoplastic chemotherapy   . 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 Port-A-Cath in place 03/27/2018  . Acute deep vein thrombosis (DVT) of femoral vein of right lower extremity ([place])   . 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[place]) 03/15/2018  . Bilateral pulmonary embolism ([place]) 03/10/2018  . Anemia 03/10/2018  . Hypoglycemia 03/10/2018  . Occult blood in stools 03/10/2018  . Abdominal mass 03/10/2018  . Tachycardia 01/31/2017  . Controlled diabetes mellitus type II without complication ([place]) 90/90/3014  . Hypertension 06/05/2014  . Irritable bowel syndrome 03/30/2010  . Morbid obesity ([place]) 12/29/2009  . Dysmetabolic syndrome X 99/69/2493  . BACK PAIN WITH RADICULOPATHY  06/09/2007  . Hyperlipidemia 05/12/2007  . ALLERGIC RHINITIS 05/12/2007   Donato Heinz. Owens Shark PT  [place] 10/10/2018, [place], [place], [address] Phone: [phone]   Fax:  [phone]  Name: Mark Frey MRN: [id number] Date of Birth: 04-May-1956

## 2018-10-11 ENCOUNTER — Encounter: Payer: Self-pay | Admitting: Radiation Oncology

## 2018-10-11 NOTE — Progress Notes (Signed)
HEMATOLOGY/ONCOLOGY CLINIC NOTE  Date of Service: 10/13/18    Patient Care Team: [name], MD as PCP - General (Family Medicine)  CHIEF COMPLAINTS/PURPOSE OF CONSULTATION:  F/u for continued Mx of Burkitts lymphoma  HISTORY OF PRESENTING ILLNESS:   Mark Frey is a wonderful 63 y.o. male who has been referred to [place] by my colleague Dr. 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 He is accompanied today by his wife. The pt reports that he is doing well overall.   The pt started R-EPOCH every 3 weeks with neulasta support on 03/17/18. He presented to the ED with right leg swelling, found to be a DVT and bilateral PE, which resulted in the incidental finding of a lower abdomen tumor measuring 18.7 x 18.5 x 17.8 cm. He denies any abdominal pains or changes in his bowel habits prior to this finding. He first noted blood in the stools 4-5 days prior to appearing to the ED.  The pt reports that he has been taking 141m Lovenox injections once each day, except for the days in which he had blood in the stools. He hasn't had blood in his stools for the last 7 days.   He has had GI bleed related to bowel involvement with his lymphoma - this is now resolving and his hgb is stabilizing.  He notes that his scrotum and leg swelling has decreased. He denies having CP or SOB at any point from his PE.   Most recent lab results (04/06/18) of CBC w/diff, CMP, Reticulocytes  is as follows: all values are WNL except for WBC at 15.3k, RBC at 3.65, HGB at 10.5, HCT at 31.4, RDW at 17.8, PLT at 640k, ANC at 12.7k, Monocytes abs at 1.6k, Glucose at 162, Total Protein at 6.2, Albumin at 3.1, Total bilirubin at <0.2, Retic ct pct at 2.5%. Uric acid 04/06/18 was low at 3.0 LDH 04/06/18 elevated at 251  On review of systems, pt reports remaining right leg swelling, blood in the stools 7 days ago, two  bowel movements each day, left leg swelling, improved scrotal swelling, eating well, and denies problems with his port, abdominal pains, constipation, diarrhea, jaw pain, pain along the spine, problems passing urine, and any other symptoms.  Interval History:   Mark SEARreturns today for management and evaluation of his Burkitt's lymphoma. The patient's last visit with [place] on 09/21/18. He is accompanied today by his wife. The pt reports that he is doing well overall .   The pt notes that he has been feeling very well in the interim, has been eating better, and has gained a few pounds.The pt reports that his leg swelling has been stable. The pt has continued on 10071mMagnesium BID since his last visit. The pt is having a combination of more formed vs more liquid stools intermittently. The pt notes that he empties his ostomy bag 8 times a day, which he notes is mostly because he doesn't want it to be very full.   The pt continues on Janumet as well and denies senses of low sugars. He notes he is roughly 40 pounds lighter now than he was when he first began Ja[place]arral The pt has begun outpatient physical therapy two weeks ago, and endorses this being very helpful for him so far.  The pt had concerns of IBS prior to presenting with Burkitt's lymphoma. The  pt notes that 10 years ago he had left lower quadrant discomfort, which began moving into his central lower abdomen 1.5-2 years ago. He notes that he took ibuprofen for a few days when this would occur, for successful pain resolution. He denies any other symptoms like blood in the stools, mucous in the stools, or weight loss at that time.  Of note since the patient's last visit, pt has had a CT A/P completed on 10/05/18 with results revealing Persistent wall thickening in the sigmoid colon although a component of this could be due to diverticulosis. There is also a suggestion of wall thickening and potential mucosal enhancement in the terminal  ileum, as well as adjacent indistinct stranding in the mesenteric and omental adipose tissues roughly similar to the prior exam. Some of this may be treatment related. Inflammatory process such as Crohn's disease not excluded. 2. No current adenopathy. 3. Other imaging findings of potential clinical significance: Nonobstructive right nephrolithiasis. Aortic Atherosclerosis. Moderate prostatomegaly. Left foraminal impingement at L4-5.  Lab results (09/28/18) of CBC w/diff and CMP is as follows: all values are WNL except for RBC at 3.43, HGB at 11.1, HCT at 35.9, MCV at 104.7, RDW at 19.6, ANC at 1.6k. 09/28/18 LDH at 139  On review of systems, pt reports good energy levels, eating well, weight gain, moving his bowels, feeling generally well, increased activity, stable ankle swelling, and denies fevers, chills, night sweats, problems with the port, abdominal pains, blood or mucous in the stools, problems passing urine, and any other symptoms.   MEDICAL HISTORY:  Past Medical History:  Diagnosis Date  . ALLERGIC RHINITIS   . Cancer ([place])    Lymphoma   . Diabetes mellitus   . Hyperlipidemia     SURGICAL HISTORY: Past Surgical History:  Procedure Laterality Date  . BIOPSY  03/15/2018   Procedure: BIOPSY;  Surgeon: Milus Banister, MD;  Location: [name];  Service: Endoscopy;;  . BOWEL RESECTION N/A 08/25/2018   Procedure: LAPROSCOPIC LOOP ILEOSTOMY;  Surgeon: Greer Pickerel, MD;  Location: [facility];  Service: General;  Laterality: N/A;  . CLEFT PALATE REPAIR    . COLONOSCOPY N/A 03/15/2018   Procedure: COLONOSCOPY;  Surgeon: Milus Banister, MD;  Location: [facility];  Service: Endoscopy;  Laterality: N/A;  . COLONOSCOPY N/A 08/20/2018   Procedure: COLONOSCOPY;  Surgeon: Irene Shipper, MD;  Location: [facility];  Service: Endoscopy;  Laterality: N/A;  . deviated septum repair     slight improvement  . ESOPHAGOGASTRODUODENOSCOPY N/A 03/15/2018   Procedure: ESOPHAGOGASTRODUODENOSCOPY (EGD);   Surgeon: Milus Banister, MD;  Location: [name];  Service: Endoscopy;  Laterality: N/A;  . IR IMAGING GUIDED PORT INSERTION  03/17/2018  . LAPAROSCOPY N/A 08/25/2018   Procedure: LAPAROSCOPY DIAGNOSTIC;  Surgeon: Greer Pickerel, MD;  Location: [facility];  Service: General;  Laterality: N/A;  . TONSILLECTOMY      SOCIAL HISTORY: Social History   Socioeconomic History  . Marital status: Married    Spouse name: lisa  . Number of children: 0  . Years of education: Not on file  . Highest education level: Not on file  Occupational History  . Not on file  Social Needs  . Financial resource strain: Not hard at all  . Food insecurity:    Worry: Never true    Inability: Never true  . Transportation needs:    Medical: No    Non-medical: No  Tobacco Use  . Smoking status: Never Smoker  . Smokeless tobacco: Never  Used  Substance and Sexual Activity  . Alcohol use: Yes    Comment: occasional  . Drug use: No  . Sexual activity: Yes  Lifestyle  . Physical activity:    Days per week: 0 days    Minutes per session: 0 min  . Stress: Not at all  Relationships  . Social connections:    Talks on phone: More than three times a week    Gets together: More than three times a week    Attends religious service: 1 to 4 times per year    Active member of club or organization: No    Attends meetings of clubs or organizations: Never    Relationship status: Married  . Intimate partner violence:    Fear of current or ex partner: No    Emotionally abused: No    Physically abused: No    Forced sexual activity: No  Other Topics Concern  . Not on file  Social History Narrative   Married 1985. No kids. 4 small dogs.       Works in Financial trader, residential      Hobbies: work on cars, Haematologist, exercise as able    FAMILY HISTORY: Family History  Problem Relation Age of Onset  . Lung cancer Mother        smoker  . Brain cancer Mother        metastasis  . AAA (abdominal aortic  aneurysm) Father        smoker    ALLERGIES:  is allergic to ciprofloxacin.  MEDICATIONS:  Current Outpatient Medications  Medication Sig Dispense Refill  . acetaminophen (TYLENOL) 325 MG tablet Take 2 tablets (650 mg total) by mouth every 6 (six) hours as needed.    . blood glucose meter kit and supplies Dispense based on patient and insurance preference. Use daily as directed. (E11.9). 1 each 0  . ELIQUIS 5 MG TABS tablet TAKE 1 TABLET BY MOUTH TWICE A DAY 60 tablet 3  . glucose blood (FREESTYLE TEST STRIPS) test strip Use to check blood sugar daily 100 each 4  . JANUMET 50-1000 MG tablet TAKE 1 TABLET TWICE DAILY  WITH MEALS 180 tablet 3  . lidocaine-prilocaine (EMLA) cream Apply 1 application topically as needed. (Patient not taking: Reported on 09/29/2018) 30 g 2  . Magnesium 500 MG CAPS Take 1 capsule (500 mg total) by mouth 2 (two) times daily. 60 capsule 0  . Probiotic Product (PROBIOTIC-10) CAPS Take 1 capsule by mouth daily.     No current facility-administered medications for this visit.     REVIEW OF SYSTEMS:    A 10+ POINT REVIEW OF SYSTEMS WAS OBTAINED including neurology, dermatology, psychiatry, cardiac, respiratory, lymph, extremities, GI, GU, Musculoskeletal, constitutional, breasts, reproductive, HEENT.  All pertinent positives are noted in the HPI.  All others are negative.   PHYSICAL EXAMINATION: ECOG PERFORMANCE STATUS: 1 - Symptomatic but completely ambulatory  Vitals:   10/13/18 1105  BP: 131/77  Pulse: 92  Resp: 17  Temp: 98 F (36.7 C)  SpO2: 100%   Filed Weights   10/13/18 1105  Weight: 218 lb 1.6 oz (98.9 kg)   .Body mass index is 32.21 kg/m.  GENERAL:alert, in no acute distress and comfortable SKIN: no acute rashes, no significant lesions EYES: conjunctiva are pink and non-injected, sclera anicteric OROPHARYNX: MMM, no exudates, no oropharyngeal erythema or ulceration NECK: supple, no JVD LYMPH:  no palpable lymphadenopathy in the cervical,  axillary or inguinal regions LUNGS: clear to auscultation  b/l with normal respiratory effort HEART: regular rate & rhythm ABDOMEN:  normoactive bowel sounds , non tender, not distended. No palpable hepatosplenomegaly. Ileostomy in situ. Extremity: no pedal edema PSYCH: alert & oriented x 3 with fluent speech NEURO: no focal motor/sensory deficits   LABORATORY DATA:   . CBC Latest Ref Rng & Units 09/28/2018 09/21/2018 08/28/2018  WBC 4.0 - 10.5 K/uL 4.9 6.6 4.5  Hemoglobin 13.0 - 17.0 g/dL 11.1(L) 10.5(L) 9.1(L)  Hematocrit 39.0 - 52.0 % 35.9(L) 33.8(L) 29.9(L)  Platelets 150 - 400 K/uL 203 192 151    . CMP Latest Ref Rng & Units 09/28/2018 09/21/2018 08/28/2018  Glucose 70 - 99 mg/dL 91 79 161(H)  BUN 8 - 23 mg/dL _0 Creatinine 0.61 - 1.24 mg/dL 0.77 0.75 0.57(L)  Sodium 135 - 145 mmol/L 144 143 136  Potassium 3.5 - 5.1 mmol/L 4.5 3.9 4.7  Chloride 98 - 111 mmol/L 108 107 103  CO2 22 - 32 mmol/L _1 Calcium 8.9 - 10.3 mg/dL 9.9 9.4 9.4  Total Protein 6.5 - 8.1 g/dL 7.4 7.0 5.9(L)  Total Bilirubin 0.3 - 1.2 mg/dL 0.3 0.2(L) 0.4  Alkaline Phos 38 - 126 U/L 84 77 65  AST 15 - 41 U/L _2 ALT 0 - 44 U/L 34 33 24   . Lab Results  Component Value Date   LDH 139 09/28/2018     03/17/18 Cytogenetics:   03/17/18 Colon Bx:   03/13/18 Peritoneum Biopsy:     RADIOGRAPHIC STUDIES:  I have personally reviewed the radiological images as listed and agreed with the findings in the report. Ct Abdomen Pelvis W Contrast  Result Date: 10/05/2018 CLINICAL DATA:  Burkitt lymphoma diagnosed June 2019, chemotherapy complete. Prior colostomy. Restaging assessment. EXAM: CT ABDOMEN AND PELVIS WITH CONTRAST TECHNIQUE: Multidetector CT imaging of the abdomen and pelvis was performed using the standard protocol following bolus administration of intravenous contrast. CONTRAST:  140m OMNIPAQUE IOHEXOL 300 MG/ML  SOLN COMPARISON:  08/22/2018 FINDINGS: Lower chest: Mild scarring both  lower lobes. The degree of surrounding atelectasis is reduced compared to 08/22/2018. Hepatobiliary: 5 mm (image 11/2) and 4 mm (image 35/2 hypodense lesions in the right hepatic lobe are stable. Gallbladder unremarkable. No biliary dilatation. Pancreas: Unremarkable Spleen: Unremarkable Adrenals/Urinary Tract: Stable small hypodense renal lesions are technically too small to characterize although statistically likely to be benign. 6 mm nonobstructive right mid kidney calculus, image 57/6. 2 mm right kidney upper pole calculus, image 64/6. No other urinary tract calculi are observed. The prior right ureteral calculus has cleared. Stomach/Bowel: Interval loop enterostomy in the right lower quadrant. There is some potential wall thickening in the terminal ileum on images 56 through 62 of series 2. Nondistended colon. Scattered descending and proximal sigmoid colon diverticula. Wall thickening in the sigmoid colon. Mild stranding around the terminal ileum and lower omentum, similar to the prior exam. Vascular/Lymphatic: Aortoiliac atherosclerotic vascular disease. Reproductive: Moderate prostatomegaly. Stable calcifications in the prostate gland. Other: No supplemental non-categorized findings. Musculoskeletal: Mild left foraminal stenosis at L4-5 due to facet arthropathy and disc bulge. IMPRESSION: 1. Persistent wall thickening in the sigmoid colon although a component of this could be due to diverticulosis. There is also a suggestion of wall thickening and potential mucosal enhancement in the terminal ileum, as well as adjacent indistinct stranding in the mesenteric and omental adipose tissues roughly similar to the prior exam. Some of this may be treatment related. Inflammatory process such as Crohn's disease  not excluded. 2. No current adenopathy. 3. Other imaging findings of potential clinical significance: Nonobstructive right nephrolithiasis. Aortic Atherosclerosis (ICD10-I70.0). Moderate prostatomegaly. Left  foraminal impingement at L4-5. Electronically Signed   By: Van Clines M.D.   On: 10/05/2018 13:58    ASSESSMENT & PLAN:   63 y.o. male with  1. 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 No BM or CNS involvement.   04/18/18 PET/CT revealed Continued improvement in right colonic wall thickening and surrounding bulky masses consistent with treated lymphoma. No hypermetabolic activity to suggest residual tumor. There is some hypermetabolic activity associated with the sigmoid colon which demonstrates mild wall thickening, surrounding inflammation and underlying diverticulosis, suggesting mild diverticulitis. Suspected treatment related changes throughout the bone marrow. There is mildly increased activity within the clivus without clear corresponding finding on the CT images. Attention on follow-up recommended. Nonobstructing right renal calculi. 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   06/09/18 PET/CT revealedSoft tissue lesion within the ventral pelvis is again identified demonstrating mild to moderate increased uptake within SUV max of 5.61. Deauville criteria 4. Concerning for residual metabolically active tumor. 2. Similar appearance of diffusely thickened appendix within the right lower quadrant of the abdomen. Findings may reflect treatment related changes. 3. Increased radiotracer uptake throughout the bone marrow which is favored to represent treatment related  changes. 4. Lad coronary artery atherosclerotic calcifications.   patient completed planned chemorx on 10/21  2.H/oRLE DVT and b/l PEon anticoagulation.  3.h/oGI bleeding from lymphoma involving bowels on presentation-- resolved at this time -- will need to monitor with anticoagulation.  4. DM2 with some increased hyperglycemia with steroids Plan -monitor  -continue Oral hypoglycemics and carb consistent diet at this time.   5. Small bowel obstruction/ileus with sigmoid thickening/stenosis .  CT scan - sigmoid obstructive lesion ? Scar tissue from resolved lymphoma vs residual lymphoma vs post inflammatory scarring from diverticulitis/limited perforation. Also has SBO from adhesions. Colonoscopy 08/21/2018 - no intramucosal lesions. Extrinsic compression of sigmoid colon. 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 Greer Pickerel  PLAN: -Discussed pt labwork from 09/28/18; LDH normal at 139 and chemistries are normal. HGB improved to 11.1. PLT and WBC normal. ANC borderline low at 1.6k. -Discussed the 10/05/18 CT A/P which revealed Persistent wall thickening in the sigmoid colon although a component of this could be due to diverticulosis. There is also a suggestion of wall thickening and potential mucosal enhancement in the terminal ileum, as well as adjacent indistinct stranding in the mesenteric and omental adipose tissues roughly similar to the prior exam. Some of this may be treatment related. Inflammatory process such as Crohn's disease not excluded. 2. No current adenopathy. 3. Other imaging findings of potential clinical significance: Nonobstructive right nephrolithiasis. Aortic Atherosclerosis. Moderate prostatomegaly. Left foraminal impingement at L4-5. -Discussed the suspicious findings from the CT for a gastrointestinal inflammatory process ? Previous Crohns -Discussed that there is no new lymphadenopathy from the CT scan, and his LDH has continued to trend downwards and in the  normal range, both of which are reassuring. No obvious disease  progression, and as the patient has Burkitt's, this too is reassuring.  -Have discussed this patient's case with my colleague Dr. Isidore Moos in [place], and discussed with the pt and his wife the recommendation to pursue consolidative RT with Dr. Isidore Moos. -Discussed that re-anastomosis considerations will be evaluated after completing RT -Recommended that the pt track his blood sugars with a glucometer at home, and then discuss his DM management further with his PCP, as the pt has lost weight -Continue Eliquis  -Feet elevation and compression sock use -Recommend consuming mixed nut powder and other magnesium rich foods -Did refer the pt to outpatient PT which he is following on -Did refer the pt to our nutritional therapist Ernestene Kiel - he has gained back his weight and is doing well. -Will see the pt back in 4 weeks    Labs today RTC with Dr [name] with labs in 4 weeks   All of the patients questions were answered with apparent satisfaction. The patient knows to call the clinic with any problems, questions or concerns.  The total time spent in the appt was 45 minutes and more than 50% was on counseling and direct patient cares.    Sullivan Lone MD [place] AAHIVMS [facility] Hematology/Oncology Physician [facility]  (Office):       [phone] (Work cell):  [phone] (Fax):           [phone]  I, Baldwin [place], am acting as a scribe for Dr. Sullivan Lone.   .I have reviewed the above documentation for accuracy and completeness, and I agree with the above. [name] MD

## 2018-10-12 ENCOUNTER — Ambulatory Visit: Payer: BLUE CROSS/BLUE SHIELD

## 2018-10-12 DIAGNOSIS — R293 Abnormal posture: Secondary | ICD-10-CM | POA: Diagnosis not present

## 2018-10-12 DIAGNOSIS — I890 Lymphedema, not elsewhere classified: Secondary | ICD-10-CM

## 2018-10-12 DIAGNOSIS — M6281 Muscle weakness (generalized): Secondary | ICD-10-CM

## 2018-10-12 NOTE — Therapy (Signed)
[place], [place], [address] Phone: [phone]   Fax:  [phone]  Physical Therapy Treatment  Patient Details  Name: Mark Frey MRN: [id number] Date of Birth: [birth year] Referring Provider (PT): [name]    Encounter Date: 10/12/2018  PT End of Session - 10/12/18 1153    Visit Number  5    Number of Visits  17    Date for PT Re-Evaluation  11/28/18    PT Start Time  1102    PT Stop Time  1150    PT Time Calculation (min)  48 min    Activity Tolerance  Patient tolerated treatment well    Behavior During Therapy  [facility] for tasks assessed/performed       Past Medical History:  Diagnosis Date  . ALLERGIC RHINITIS   . Cancer ([place])    Lymphoma   . Diabetes mellitus   . Hyperlipidemia     Past Surgical History:  Procedure Laterality Date  . BIOPSY  03/15/2018   Procedure: BIOPSY;  Surgeon: Milus Banister, MD;  Location: [name];  Service: Endoscopy;;  . BOWEL RESECTION N/A 08/25/2018   Procedure: LAPROSCOPIC LOOP ILEOSTOMY;  Surgeon: Greer Pickerel, MD;  Location: [facility];  Service: General;  Laterality: N/A;  . CLEFT PALATE REPAIR    . COLONOSCOPY N/A 03/15/2018   Procedure: COLONOSCOPY;  Surgeon: Milus Banister, MD;  Location: [facility];  Service: Endoscopy;  Laterality: N/A;  . COLONOSCOPY N/A 08/20/2018   Procedure: COLONOSCOPY;  Surgeon: Irene Shipper, MD;  Location: [facility];  Service: Endoscopy;  Laterality: N/A;  . deviated septum repair     slight improvement  . ESOPHAGOGASTRODUODENOSCOPY N/A 03/15/2018   Procedure: ESOPHAGOGASTRODUODENOSCOPY (EGD);  Surgeon: Milus Banister, MD;  Location: [name];  Service: Endoscopy;  Laterality: N/A;  . IR IMAGING GUIDED PORT INSERTION  03/17/2018  . LAPAROSCOPY N/A 08/25/2018   Procedure: LAPAROSCOPY DIAGNOSTIC;  Surgeon: Greer Pickerel, MD;  Location: [facility];  Service: General;  Laterality: N/A;  . TONSILLECTOMY      There were  no vitals filed for this visit.  Subjective Assessment - 10/12/18 1102    Subjective  Doing well, felt good after last visit.     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  He has history of RLE and bilateral PE  He has 6 rounds of chemo and after that he had a bowel blockage that resulted in a temporarry colostomy 08/25/2018.  He is doing much better since that .  He has lost about 60 pounds  Past history included DM, past history of back pain that was resolved with chiropractics     Patient Stated Goals  to get stronger He wants to be able to squat and get up     Currently in Pain?  No/denies                       [facility] Adult PT Treatment/Exercise - 10/12/18 0001      Knee/Hip Exercises: Aerobic   Tread Mill  1.8 mph x 12 min RPE 3 after HR 115 after treadmill ( mod intesnitiy HR range 135-142); then at end of session x5 more mins at 1.8 mph      Knee/Hip Exercises: Standing   Heel Raises  Both;20 reps    Hip Abduction  Stengthening;Right;Left;15 reps   second set 10x due to LE fatigue   Abduction Limitations  green  theraband around ankle at back of bike    Forward Step Up  Right;Left;10 reps;Step Height: 6"   with contralateral OH press 4#   Forward Step Up Limitations  then repeated all above standing exs second set for circuit      Shoulder Exercises: Standing   Flexion  Strengthening;Right;Left;10 reps   back against wall and core engaged   Shoulder Flexion Weight (lbs)  5    ABduction  Strengthening;Right;Left;10 reps;Weights   back against wall; then scaption 10x each   Shoulder ABduction Weight (lbs)  5    Extension  Strengthening;Right;Left;10 reps;20 reps;Theraband   2x10   Theraband Level (Shoulder Extension)  Level 3 (Green)    Row  Strengthening;Right;Left;20 reps;Theraband   2x10 , in circuit with ext   Theraband Level (Shoulder Row)  Level 3 (Green)    Row Limitations  VCs throughout to keep head erect                PT Short Term Goals - 09/29/18 1152      PT SHORT TERM GOAL #1   Title  Pt will report he can do a beginning home exercise program using heart rate reserve or rate of perceived exertion as an intensity guide     Time  4    Period  Weeks    Status  New      PT SHORT TERM GOAL #2   Title  Pt will increase sit to stand in 30 seconds to 11 indicating improvment in general strength     Time  4    Period  Weeks    Status  New        PT Long Term Goals - 09/29/18 1153      PT LONG TERM GOAL #1   Title  Pt will increase sit to stand in 30 seconds to 14 indicating improvment in general strength     Baseline  9    Time  8    Period  Weeks    Status  New      PT LONG TERM GOAL #2   Title  Pt will increase averge grip strength in each hand to 70 pounds indicating an improvement in general strength     Time  8    Period  Weeks    Status  New      PT LONG TERM GOAL #3   Title  Pt will decrease TUG to < 8 seconds indicating an improvment in functional balance and mobility     Time  8    Period  Weeks    Status  New      PT LONG TERM GOAL #4   Title  Pt will be able to get down to the floor and get back up so he can get in position to do some job duties     Time  8    Period  Weeks    Status  New      PT LONG TERM GOAL #5   Title  Pt will have a plan to continue with community exercise on his own at home     Time  8    Period  Weeks    Status  New            Plan - 10/12/18 1153    Clinical Impression Statement  Was able to progress all exercises today and pt able to tolerate them in circuit today performing increased reps. He reports "  feeling" the workout today and is pleased he is starting to be able to tolerate doing more. Once he feels stronger he is hopeful to join [employer] and continue working out more regularly. Pt required 3 seated breaks during session today.    Rehab Potential  Excellent    Clinical Impairments Affecting Rehab  Potential  colostomy     PT Frequency  2x / week    PT Duration  8 weeks    PT Treatment/Interventions  ADLs/Self Care Home Management;Gait training;Stair training;Functional mobility training;Balance training;Therapeutic exercise;Therapeutic activities;Patient/family education;Orthotic Fit/Training;Manual techniques;Taping    PT Next Visit Plan  Try elliptical; Cont treadmill x 12 min, check HR  Do strengthening exercise as circut with step ups, rows, curls heel lifts vs consider taking to other side to work on gym equipment (though pt does want to be instructed on gym equipment before D/C as he plans to join a gym).    Consulted and Agree with Plan of Care  Patient       Patient will benefit from skilled therapeutic intervention in order to improve the following deficits and impairments:  Postural dysfunction, Decreased mobility, Decreased activity tolerance, Decreased endurance, Decreased strength, Impaired perceived functional ability, Increased edema  Visit Diagnosis: Abnormal posture  Muscle weakness (generalized)  Lymphedema     Problem List Patient Active Problem List   Diagnosis Date Noted  . Abnormal CT of the abdomen   . Protein-calorie malnutrition, severe 08/19/2018  . 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 Diverticulitis of colon   . Low magnesium level 07/30/2018  . Hypokalemia 07/21/2018  . Bowel obstruction in setting of lymphoma and sigmoid stricture 07/21/2018  . Diarrhea 07/21/2018  . Burkitt lymphoma, lymph nodes of multiple sites ([place]) 05/22/2018  . Diabetes mellitus ([place])   . Edema   . Deep vein thrombosis (DVT) of femoral vein of right lower extremity ([place])   . Counseling regarding advance care planning and goals of care 04/25/2018  . Gastrointestinal hemorrhage   . Encounter for antineoplastic chemotherapy   . 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[place]) 04/10/2018  . Port-A-Cath in place 03/27/2018  . Acute deep vein thrombosis  (DVT) of femoral vein of right lower extremity ([place])   . 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 Bilateral pulmonary embolism ([place]) 03/10/2018  . Anemia 03/10/2018  . Hypoglycemia 03/10/2018  . Occult blood in stools 03/10/2018  . Abdominal mass 03/10/2018  . Tachycardia 01/31/2017  . Controlled diabetes mellitus type II without complication ([place]) 49/70/2637  . Hypertension 06/05/2014  . Irritable bowel syndrome 03/30/2010  . Morbid obesity ([place]) 12/29/2009  . Dysmetabolic syndrome X 85/88/5027  . BACK PAIN WITH RADICULOPATHY 06/09/2007  . Hyperlipidemia 05/12/2007  . ALLERGIC RHINITIS 05/12/2007    Otelia Limes, PTA 10/12/2018, 11:57 AM  [place], [place], [address] Phone: [phone]   Fax:  [phone]  Name: Mark Frey MRN: [id number] Date of Birth: [birth year]

## 2018-10-13 ENCOUNTER — Inpatient Hospital Stay: Payer: BLUE CROSS/BLUE SHIELD

## 2018-10-13 ENCOUNTER — Other Ambulatory Visit: Payer: BLUE CROSS/BLUE SHIELD

## 2018-10-13 ENCOUNTER — Telehealth: Payer: Self-pay | Admitting: Hematology

## 2018-10-13 ENCOUNTER — Inpatient Hospital Stay (HOSPITAL_BASED_OUTPATIENT_CLINIC_OR_DEPARTMENT_OTHER): Payer: BLUE CROSS/BLUE SHIELD | Admitting: Hematology

## 2018-10-13 VITALS — BP 131/77 | HR 92 | Temp 98.0°F | Resp 17 | Ht 69.0 in | Wt 218.1 lb

## 2018-10-13 DIAGNOSIS — R790 Abnormal level of blood mineral: Secondary | ICD-10-CM

## 2018-10-13 DIAGNOSIS — E440 Moderate protein-calorie malnutrition: Secondary | ICD-10-CM | POA: Diagnosis not present

## 2018-10-13 DIAGNOSIS — C8378 Burkitt lymphoma, lymph nodes of multiple sites: Secondary | ICD-10-CM

## 2018-10-13 DIAGNOSIS — Z7901 Long term (current) use of anticoagulants: Secondary | ICD-10-CM | POA: Diagnosis not present

## 2018-10-13 DIAGNOSIS — Z86718 Personal history of other venous thrombosis and embolism: Secondary | ICD-10-CM | POA: Diagnosis not present

## 2018-10-13 DIAGNOSIS — Z79899 Other long term (current) drug therapy: Secondary | ICD-10-CM | POA: Diagnosis not present

## 2018-10-13 DIAGNOSIS — I2699 Other pulmonary embolism without acute cor pulmonale: Secondary | ICD-10-CM

## 2018-10-13 LAB — BASIC METABOLIC PANEL
Anion gap: 10 (ref 5–15)
BUN: 9 mg/dL (ref 8–23)
CALCIUM: 10.1 mg/dL (ref 8.9–10.3)
CO2: 26 mmol/L (ref 22–32)
Chloride: 107 mmol/L (ref 98–111)
Creatinine, Ser: 0.85 mg/dL (ref 0.61–1.24)
GFR calc non Af Amer: >60 mL/min (ref >60)
Glucose, Bld: 75 mg/dL (ref 70–99)
Potassium: 4.3 mmol/L (ref 3.5–5.1)
Sodium: 143 mmol/L (ref 135–145)

## 2018-10-13 LAB — PHOSPHORUS: Phosphorus: 3.7 mg/dL (ref 2.5–4.6)

## 2018-10-13 LAB — MAGNESIUM: Magnesium: 1.5 mg/dL — ABNORMAL LOW (ref 1.7–2.4)

## 2018-10-13 NOTE — Telephone Encounter (Signed)
Scheduled appt per 01/31 los.  Printed calendar and avs.

## 2018-10-17 ENCOUNTER — Ambulatory Visit: Payer: BLUE CROSS/BLUE SHIELD | Attending: Hematology

## 2018-10-17 DIAGNOSIS — M6281 Muscle weakness (generalized): Secondary | ICD-10-CM | POA: Diagnosis not present

## 2018-10-17 DIAGNOSIS — I890 Lymphedema, not elsewhere classified: Secondary | ICD-10-CM | POA: Insufficient documentation

## 2018-10-17 DIAGNOSIS — R293 Abnormal posture: Secondary | ICD-10-CM | POA: Diagnosis not present

## 2018-10-17 NOTE — Therapy (Signed)
[place], [place], [address] Phone: [phone]   Fax:  [phone]  Physical Therapy Treatment  Patient Details  Name: Mark Frey MRN: [id number] Date of Birth: Nov 17, 1955 Referring Provider (PT): [name]    Encounter Date: 10/17/2018  PT End of Session - 10/17/18 1202    Visit Number  6    Number of Visits  17    Date for PT Re-Evaluation  11/28/18    PT Start Time  1104    PT Stop Time  1153    PT Time Calculation (min)  49 min    Activity Tolerance  Patient tolerated treatment well    Behavior During Therapy  [facility] for tasks assessed/performed       Past Medical History:  Diagnosis Date  . ALLERGIC RHINITIS   . Cancer ([place])    Lymphoma   . Diabetes mellitus   . Hyperlipidemia     Past Surgical History:  Procedure Laterality Date  . BIOPSY  03/15/2018   Procedure: BIOPSY;  Surgeon: Milus Banister, MD;  Location: [name];  Service: Endoscopy;;  . BOWEL RESECTION N/A 08/25/2018   Procedure: LAPROSCOPIC LOOP ILEOSTOMY;  Surgeon: Greer Pickerel, MD;  Location: [facility];  Service: General;  Laterality: N/A;  . CLEFT PALATE REPAIR    . COLONOSCOPY N/A 03/15/2018   Procedure: COLONOSCOPY;  Surgeon: Milus Banister, MD;  Location: [facility];  Service: Endoscopy;  Laterality: N/A;  . COLONOSCOPY N/A 08/20/2018   Procedure: COLONOSCOPY;  Surgeon: Irene Shipper, MD;  Location: [facility];  Service: Endoscopy;  Laterality: N/A;  . deviated septum repair     slight improvement  . ESOPHAGOGASTRODUODENOSCOPY N/A 03/15/2018   Procedure: ESOPHAGOGASTRODUODENOSCOPY (EGD);  Surgeon: Milus Banister, MD;  Location: [name];  Service: Endoscopy;  Laterality: N/A;  . IR IMAGING GUIDED PORT INSERTION  03/17/2018  . LAPAROSCOPY N/A 08/25/2018   Procedure: LAPAROSCOPY DIAGNOSTIC;  Surgeon: Greer Pickerel, MD;  Location: [facility];  Service: General;  Laterality: N/A;  . TONSILLECTOMY      There were  no vitals filed for this visit.  Subjective Assessment - 10/17/18 1108    Subjective  My back is bothering me some today, but it's better than it was yesterday. I want to take it a little easy today. It's not hurting, just a little sore and I just want to avoid a flare up.     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  He has history of RLE and bilateral PE  He has 6 rounds of chemo and after that he had a bowel blockage that resulted in a temporarry colostomy 08/25/2018.  He is doing much better since that .  He has lost about 60 pounds  Past history included DM, past history of back pain that was resolved with chiropractics     Patient Stated Goals  to get stronger He wants to be able to squat and get up     Currently in Pain?  No/denies                       [facility] Adult PT Treatment/Exercise - 10/17/18 0001      Knee/Hip Exercises: Stretches   Active Hamstring Stretch  Right;Left;2 reps;20 seconds    Active Hamstring Stretch Limitations  seated edge of chair    Piriformis Stretch  Right;Left;1 rep;10 seconds    Piriformis Stretch Limitations  pt very tight  and this very challenging for him.      Knee/Hip Exercises: Aerobic   Elliptical  Incline 6, level 1 x5 mins with VCs throughout to keep abdominals engaged    Tread Mill  1.8 mph x 12 mins      Knee/Hip Exercises: Standing   Hip Flexion  Stengthening;Right;Left;10 reps    Hip Flexion Limitations  green theraband around ankles at back of bike on [place]    Hip Abduction  Stengthening;Right;Left;10 reps;Knee straight    Abduction Limitations  green theraband around ankle at back of bike on [employer]    Hip Extension  Stengthening;Right;Left;10 reps    Extension Limitations  green theraband around ankle at back of bike on [place]; VCs for all hip 3 ways for slow, controlled pace and correct technique throughout               PT Short Term Goals - 09/29/18 1152      PT  SHORT TERM GOAL #1   Title  Pt will report he can do a beginning home exercise program using heart rate reserve or rate of perceived exertion as an intensity guide     Time  4    Period  Weeks    Status  New      PT SHORT TERM GOAL #2   Title  Pt will increase sit to stand in 30 seconds to 11 indicating improvment in general strength     Time  4    Period  Weeks    Status  New        PT Long Term Goals - 09/29/18 1153      PT LONG TERM GOAL #1   Title  Pt will increase sit to stand in 30 seconds to 14 indicating improvment in general strength     Baseline  9    Time  8    Period  Weeks    Status  New      PT LONG TERM GOAL #2   Title  Pt will increase averge grip strength in each hand to 70 pounds indicating an improvement in general strength     Time  8    Period  Weeks    Status  New      PT LONG TERM GOAL #3   Title  Pt will decrease TUG to < 8 seconds indicating an improvment in functional balance and mobility     Time  8    Period  Weeks    Status  New      PT LONG TERM GOAL #4   Title  Pt will be able to get down to the floor and get back up so he can get in position to do some job duties     Time  8    Period  Weeks    Status  New      PT LONG TERM GOAL #5   Title  Pt will have a plan to continue with community exercise on his own at home     Time  8    Period  Weeks    Status  New            Plan - 10/17/18 1202    Clinical Impression Statement  Added elliptical which pt did very well with tolerating 5 mins, reporting this a very good workout. Continued with hip and general strengthening being mindful of not flaring up his back soreness. Added hip flexibility at end to  decrease soreness.    Rehab Potential  Excellent    Clinical Impairments Affecting Rehab Potential  colostomy     PT Frequency  2x / week    PT Duration  8 weeks    PT Treatment/Interventions  ADLs/Self Care Home Management;Gait training;Stair training;Functional mobility  training;Balance training;Therapeutic exercise;Therapeutic activities;Patient/family education;Orthotic Fit/Training;Manual techniques;Taping    PT Next Visit Plan  Cont elliptical and treadmill, check HR;  Do strengthening exercise as circut with step ups, rows, curls heel lifts vs consider taking to other side to work on gym equipment (though pt does want to be instructed on gym equipment before D/C as he plans to join a gym).    Consulted and Agree with Plan of Care  Patient       Patient will benefit from skilled therapeutic intervention in order to improve the following deficits and impairments:  Postural dysfunction, Decreased mobility, Decreased activity tolerance, Decreased endurance, Decreased strength, Impaired perceived functional ability, Increased edema  Visit Diagnosis: Abnormal posture  Muscle weakness (generalized)  Lymphedema     Problem List Patient Active Problem List   Diagnosis Date Noted  . Abnormal CT of the abdomen   . Protein-calorie malnutrition, severe 08/19/2018  . 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 Diverticulitis of colon   . Low magnesium level 07/30/2018  . Hypokalemia 07/21/2018  . Bowel obstruction in setting of lymphoma and sigmoid stricture 07/21/2018  . Diarrhea 07/21/2018  . Burkitt lymphoma, lymph nodes of multiple sites ([place]) 05/22/2018  . Diabetes mellitus ([place])   . Edema   . Deep vein thrombosis (DVT) of femoral vein of right lower extremity ([place])   . Counseling regarding advance care planning and goals of care 04/25/2018  . Gastrointestinal hemorrhage   . Encounter for antineoplastic chemotherapy   . 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 Port-A-Cath in place 03/27/2018  . Acute deep vein thrombosis (DVT) of femoral vein of right lower extremity ([place])   . 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 Bilateral pulmonary embolism ([place]) 03/10/2018  . Anemia 03/10/2018  . Hypoglycemia  03/10/2018  . Occult blood in stools 03/10/2018  . Abdominal mass 03/10/2018  . Tachycardia 01/31/2017  . Controlled diabetes mellitus type II without complication ([place]) 83/81/8403  . Hypertension 06/05/2014  . Irritable bowel syndrome 03/30/2010  . Morbid obesity ([place]) 12/29/2009  . Dysmetabolic syndrome X 75/43/6067  . BACK PAIN WITH RADICULOPATHY 06/09/2007  . Hyperlipidemia 05/12/2007  . ALLERGIC RHINITIS 05/12/2007    Otelia Limes, PTA 10/17/2018, 12:07 PM  [place], [place], [address] Phone: [phone]   Fax:  [phone]  Name: ZEV BLUE MRN: [id number] Date of Birth: May 27, 1956

## 2018-10-18 ENCOUNTER — Ambulatory Visit (INDEPENDENT_AMBULATORY_CARE_PROVIDER_SITE_OTHER): Payer: BLUE CROSS/BLUE SHIELD | Admitting: Gastroenterology

## 2018-10-18 ENCOUNTER — Encounter: Payer: Self-pay | Admitting: Gastroenterology

## 2018-10-18 ENCOUNTER — Telehealth: Payer: Self-pay | Admitting: *Deleted

## 2018-10-18 VITALS — BP 110/80 | HR 80 | Ht 69.0 in | Wt 220.0 lb

## 2018-10-18 DIAGNOSIS — C8373 Burkitt lymphoma, intra-abdominal lymph nodes: Secondary | ICD-10-CM | POA: Diagnosis not present

## 2018-10-18 DIAGNOSIS — K56699 Other intestinal obstruction unspecified as to partial versus complete obstruction: Secondary | ICD-10-CM | POA: Diagnosis not present

## 2018-10-18 NOTE — Telephone Encounter (Signed)
-----   Message from [name], MD sent at 10/15/2018 10:40 PM EST ----- Plz let patient know magnesium level are low normal -- may reduce magnesium replacement to 566m po daily. thx GK

## 2018-10-18 NOTE — Progress Notes (Signed)
Review of pertinent gastrointestinal problems: 1.  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  There is partial encasement of the sigmoid colon by the mass.  There is prominent extension of the mass into the right lower retroperitoneum and extraperitoneal right pelvis with encasement of the right external iliac and right common iliac vasculature and with infiltration of the right iliopsoas muscle": Chemotherapy until about November 2019.  Dramatic improvement in tumor size, burden.  He presented with bowel obstruction December 2019 underwent repeat colonoscopy by 1 of my partners which proved 10 cm long sigmoid stenosis that was able to be passed all the way to the cecum.  Bowel is otherwise normal.  He underwent a loop ileostomy Dr. Greer Pickerel December 2019 and in the op report Dr. Redmond Pulling described a woody texture to his cecum, terminal ileum as well as I believe the sigmoid region.     HPI: This is a very pleasant 63 year old man whom I last saw this past summer when he was initially diagnosed with Burkitt's lymphoma that he created a large mass involving colon, small bowel.  Since then his course has been somewhat complicated.  See the summary above.  Currently he looks great.  He has an ileostomy he is eating well, gaining weight.  He met with radiation oncology and they are planning external beam radiation to start in the next few weeks.  It sounds like medical oncology feels he is probably cured of Burkitt's lymphoma but the radiation is meant to make sure that is indeed the case.   Chief complaint is Burkitt's lymphoma  ROS: complete GI ROS as described in HPI, all other review negative.  Constitutional:  No unintentional weight loss   Past Medical History:  Diagnosis Date  . ALLERGIC RHINITIS   . Cancer ([place])    Lymphoma   .  Diabetes mellitus   . Hyperlipidemia     Past Surgical History:  Procedure Laterality Date  . BIOPSY  03/15/2018   Procedure: BIOPSY;  Surgeon: Milus Banister, MD;  Location: [name];  Service: Endoscopy;;  . BOWEL RESECTION N/A 08/25/2018   Procedure: LAPROSCOPIC LOOP ILEOSTOMY;  Surgeon: Greer Pickerel, MD;  Location: [facility];  Service: General;  Laterality: N/A;  . CLEFT PALATE REPAIR    . COLONOSCOPY N/A 03/15/2018   Procedure: COLONOSCOPY;  Surgeon: Milus Banister, MD;  Location: [facility];  Service: Endoscopy;  Laterality: N/A;  . COLONOSCOPY N/A 08/20/2018   Procedure: COLONOSCOPY;  Surgeon: Irene Shipper, MD;  Location: [facility];  Service: Endoscopy;  Laterality: N/A;  . deviated septum repair     slight improvement  . ESOPHAGOGASTRODUODENOSCOPY N/A 03/15/2018   Procedure: ESOPHAGOGASTRODUODENOSCOPY (EGD);  Surgeon: Milus Banister, MD;  Location: [name];  Service: Endoscopy;  Laterality: N/A;  . IR IMAGING GUIDED PORT INSERTION  03/17/2018  . LAPAROSCOPY N/A 08/25/2018   Procedure: LAPAROSCOPY DIAGNOSTIC;  Surgeon: Greer Pickerel, MD;  Location: [facility];  Service: General;  Laterality: N/A;  . TONSILLECTOMY      Current Outpatient Medications  Medication Sig Dispense Refill  . acetaminophen (TYLENOL) 325 MG tablet Take 2 tablets (650 mg total) by mouth every 6 (six) hours as needed.    . blood glucose meter kit and supplies Dispense based on patient and insurance preference. Use daily as directed. (E11.9). 1 each 0  . ELIQUIS 5 MG  TABS tablet TAKE 1 TABLET BY MOUTH TWICE A DAY 60 tablet 3  . furosemide (LASIX) 20 MG tablet Take 20 mg by mouth as needed.    [name] glucose blood (FREESTYLE TEST STRIPS) test strip Use to check blood sugar daily 100 each 4  . JANUMET 50-1000 MG tablet TAKE 1 TABLET TWICE DAILY  WITH MEALS 180 tablet 3  . lidocaine-prilocaine (EMLA) cream Apply 1 application topically as needed. 30 g 2  . Magnesium 500 MG CAPS Take 1 capsule (500 mg total) by  mouth 2 (two) times daily. (Patient taking differently: Take 500 mg by mouth daily. ) 60 capsule 0  . Probiotic Product (PROBIOTIC-10) CAPS Take 1 capsule by mouth daily.     No current facility-administered medications for this visit.     Allergies as of 10/18/2018 - Review Complete 10/18/2018  Allergen Reaction Noted  . Ciprofloxacin Other (See Comments) 01/30/2018    Family History  Problem Relation Age of Onset  . Lung cancer Mother        smoker  . Brain cancer Mother        metastasis  . AAA (abdominal aortic aneurysm) Father        smoker    Social History   Socioeconomic History  . Marital status: Married    Spouse name: lisa  . Number of children: 0  . Years of education: Not on file  . Highest education level: Not on file  Occupational History  . Not on file  Social Needs  . Financial resource strain: Not hard at all  . Food insecurity:    Worry: Never true    Inability: Never true  . Transportation needs:    Medical: No    Non-medical: No  Tobacco Use  . Smoking status: Never Smoker  . Smokeless tobacco: Never Used  Substance and Sexual Activity  . Alcohol use: Yes    Comment: occasional  . Drug use: No  . Sexual activity: Yes  Lifestyle  . Physical activity:    Days per week: 0 days    Minutes per session: 0 min  . Stress: Not at all  Relationships  . Social connections:    Talks on phone: More than three times a week    Gets together: More than three times a week    Attends religious service: 1 to 4 times per year    Active member of club or organization: No    Attends meetings of clubs or organizations: Never    Relationship status: Married  . Intimate partner violence:    Fear of current or ex partner: No    Emotionally abused: No    Physically abused: No    Forced sexual activity: No  Other Topics Concern  . Not on file  Social History Narrative   Married 1985. No kids. 4 small dogs.       Works in Financial trader,  residential      Hobbies: work on cars, Haematologist, exercise as able     Physical Exam: BP 110/80   Pulse 80   Ht _0  (1.753 m)   Wt 220 lb (99.8 kg)   BMI 32.49 kg/m  Constitutional: generally well-appearing Psychiatric: alert and oriented x3 Abdomen: soft, nontender, nondistended, no obvious ascites, no peritoneal signs, normal bowel sounds No peripheral edema noted in lower extremities Ileostomy with brown liquid effluent, gas in the bag.  Tells me he changes the bag about 8 times a day, it is 1/3-1/2  full each time that he does that.  Assessment and plan: 63 y.o. male with Burkitt's lymphoma  I understand that he is going to be having external beam radiation to help sterilize if there is any microscopic Burkitt's lymphoma left in his abdomen.  He understands that that will be several weeks course and then after that will be several weeks of recovery.  I would like to see him back here in about 3 months.  He certainly does have long-term interest in reversing his ileostomy and hopefully that will be able to be done.  I think that the stenosis in his sigmoid and the abnormal area noted during the laparoscopy in December and his terminal ileum and cecum is all related to either microscopic residual Burkitt's or desmoplastic reaction related to chemotherapy, lymphoma treatment, tissue reaction to the lymphoma treatment or the lymphoma itself.  Please see the "Patient Instructions" section for addition details about the plan.  Owens Loffler, MD [facility] 10/18/2018, 1:32 PM

## 2018-10-18 NOTE — Telephone Encounter (Signed)
Patient contacte as directed by [name] - see previous note: given information and direction to take magnesium 560m only once a day instead of twice.. Patient verbalized understanding.

## 2018-10-18 NOTE — Patient Instructions (Addendum)
Please return to see Dr. Ardis Hughs in 3 months. We will contact you  Thank you for entrusting me with your care and choosing [facility].  Dr Ardis Hughs

## 2018-10-19 ENCOUNTER — Ambulatory Visit: Payer: BLUE CROSS/BLUE SHIELD

## 2018-10-19 DIAGNOSIS — M6281 Muscle weakness (generalized): Secondary | ICD-10-CM

## 2018-10-19 DIAGNOSIS — I890 Lymphedema, not elsewhere classified: Secondary | ICD-10-CM

## 2018-10-19 DIAGNOSIS — R293 Abnormal posture: Secondary | ICD-10-CM

## 2018-10-19 NOTE — Therapy (Signed)
[place], [place], [address] Phone: [phone]   Fax:  [phone]  Physical Therapy Treatment  Patient Details  Name: Mark Frey MRN: [id number] Date of Birth: 1956/06/24 Referring Provider (PT): [name]    Encounter Date: 10/19/2018  PT End of Session - 10/19/18 1151    Visit Number  7    Number of Visits  17    Date for PT Re-Evaluation  11/28/18    PT Start Time  1103    PT Stop Time  1143    PT Time Calculation (min)  40 min    Activity Tolerance  Patient tolerated treatment well    Behavior During Therapy  [facility] for tasks assessed/performed       Past Medical History:  Diagnosis Date  . ALLERGIC RHINITIS   . Cancer ([place])    Lymphoma   . Diabetes mellitus   . Hyperlipidemia     Past Surgical History:  Procedure Laterality Date  . BIOPSY  03/15/2018   Procedure: BIOPSY;  Surgeon: Milus Banister, MD;  Location: [name];  Service: Endoscopy;;  . BOWEL RESECTION N/A 08/25/2018   Procedure: LAPROSCOPIC LOOP ILEOSTOMY;  Surgeon: Greer Pickerel, MD;  Location: [facility];  Service: General;  Laterality: N/A;  . CLEFT PALATE REPAIR    . COLONOSCOPY N/A 03/15/2018   Procedure: COLONOSCOPY;  Surgeon: Milus Banister, MD;  Location[facility];  Service: Endoscopy;  Laterality: N/A;  . COLONOSCOPY N/A 08/20/2018   Procedure: COLONOSCOPY;  Surgeon: Irene Shipper, MD;  Location: [facility];  Service: Endoscopy;  Laterality: N/A;  . deviated septum repair     slight improvement  . ESOPHAGOGASTRODUODENOSCOPY N/A 03/15/2018   Procedure: ESOPHAGOGASTRODUODENOSCOPY (EGD);  Surgeon: Milus Banister, MD;  Location: [name];  Service: Endoscopy;  Laterality: N/A;  . IR IMAGING GUIDED PORT INSERTION  03/17/2018  . LAPAROSCOPY N/A 08/25/2018   Procedure: LAPAROSCOPY DIAGNOSTIC;  Surgeon: Greer Pickerel, MD;  Location: [facility];  Service: General;  Laterality: N/A;  . TONSILLECTOMY      There were  no vitals filed for this visit.  Subjective Assessment - 10/19/18 1109    Subjective  My back is doing better, just still want to make sure I don't overdo it. I felt good after last visit.     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  He has history of RLE and bilateral PE  He has 6 rounds of chemo and after that he had a bowel blockage that resulted in a temporarry colostomy 08/25/2018.  He is doing much better since that .  He has lost about 60 pounds  Past history included DM, past history of back pain that was resolved with chiropractics     Patient Stated Goals  to get stronger He wants to be able to squat and get up     Currently in Pain?  No/denies                       [facility] Adult PT Treatment/Exercise - 10/19/18 0001      Knee/Hip Exercises: Aerobic   Elliptical  Incline 6, level 1 x5 mins with VCs throughout to keep abdominals engaged    Tread Mill  1.8 mph x11 mins (pt didn't want to try       Knee/Hip Exercises: Machines for Strengthening   Cybex Knee Extension  10# x10 with VCs for full extension  Cybex Knee Flexion  25# x10    Cybex Leg Press  35# x10 with VCs for slow, controled pace      Shoulder Exercises: ROM/Strengthening   Lat Pull  10 reps   25#   Lat Pull Limitations  VCs and demo throughout for correct technique and not round shoulders at end of motion    Cybex Row  10 reps   20#   Other ROM/Strengthening Exercises  Chest Press 20# x10               PT Short Term Goals - 09/29/18 1152      PT SHORT TERM GOAL #1   Title  Pt will report he can do a beginning home exercise program using heart rate reserve or rate of perceived exertion as an intensity guide     Time  4    Period  Weeks    Status  New      PT SHORT TERM GOAL #2   Title  Pt will increase sit to stand in 30 seconds to 11 indicating improvment in general strength     Time  4    Period  Weeks    Status  New        PT  Long Term Goals - 09/29/18 1153      PT LONG TERM GOAL #1   Title  Pt will increase sit to stand in 30 seconds to 14 indicating improvment in general strength     Baseline  9    Time  8    Period  Weeks    Status  New      PT LONG TERM GOAL #2   Title  Pt will increase averge grip strength in each hand to 70 pounds indicating an improvement in general strength     Time  8    Period  Weeks    Status  New      PT LONG TERM GOAL #3   Title  Pt will decrease TUG to < 8 seconds indicating an improvment in functional balance and mobility     Time  8    Period  Weeks    Status  New      PT LONG TERM GOAL #4   Title  Pt will be able to get down to the floor and get back up so he can get in position to do some job duties     Time  8    Period  Weeks    Status  New      PT LONG TERM GOAL #5   Title  Pt will have a plan to continue with community exercise on his own at home     Time  8    Period  Weeks    Status  New            Plan - 10/19/18 1151    Clinical Impression Statement  Progressed pt to include strengthening with weights in anticipation of him transitioning to gym ([employer]) ina few weeks as he feels stronger and more capable. Pt was very encouraged by todays session and would like to continue with todays regimen.    Rehab Potential  Excellent    Clinical Impairments Affecting Rehab Potential  colostomy     PT Frequency  2x / week    PT Duration  8 weeks    PT Treatment/Interventions  ADLs/Self Care Home Management;Gait training;Stair training;Functional mobility training;Balance training;Therapeutic exercise;Therapeutic activities;Patient/family education;Orthotic  Fit/Training;Manual techniques;Taping    PT Next Visit Plan  Cont elliptical for warm up, strengthening with machines and treadmill after. Do machines in circuit and progress reps as pt able.     Consulted and Agree with Plan of Care  Patient       Patient will benefit from skilled therapeutic  intervention in order to improve the following deficits and impairments:  Postural dysfunction, Decreased mobility, Decreased activity tolerance, Decreased endurance, Decreased strength, Impaired perceived functional ability, Increased edema  Visit Diagnosis: Abnormal posture  Muscle weakness (generalized)  Lymphedema     Problem List Patient Active Problem List   Diagnosis Date Noted  . Abnormal CT of the abdomen   . Protein-calorie malnutrition, severe 08/19/2018  . 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 Diverticulitis of colon   . Low magnesium level 07/30/2018  . Hypokalemia 07/21/2018  . Bowel obstruction in setting of lymphoma and sigmoid stricture 07/21/2018  . Diarrhea 07/21/2018  . Burkitt lymphoma, lymph nodes of multiple sites ([place]) 05/22/2018  . Diabetes mellitus ([place])   . Edema   . Deep vein thrombosis (DVT) of femoral vein of right lower extremity ([place])   . Counseling regarding advance care planning and goals of care 04/25/2018  . Gastrointestinal hemorrhage   . Encounter for antineoplastic chemotherapy   . 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[place]) 04/10/2018  . Port-A-Cath in place 03/27/2018  . Acute deep vein thrombosis (DVT) of femoral vein of right lower extremity ([place])   . 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 Bilateral pulmonary embolism ([place]) 03/10/2018  . Anemia 03/10/2018  . Hypoglycemia 03/10/2018  . Occult blood in stools 03/10/2018  . Abdominal mass 03/10/2018  . Tachycardia 01/31/2017  . Controlled diabetes mellitus type II without complication ([place]) 81/85/6314  . Hypertension 06/05/2014  . Irritable bowel syndrome 03/30/2010  . Morbid obesity ([place]) 12/29/2009  . Dysmetabolic syndrome X 97/10/6376  . BACK PAIN WITH RADICULOPATHY 06/09/2007  . Hyperlipidemia 05/12/2007  . ALLERGIC RHINITIS 05/12/2007    Otelia Limes, PTA 10/19/2018, 11:55 AM  [place], [place], [address] Phone: [phone]   Fax:  [phone]  Name: KIMON LOEWEN MRN: [id number] Date of Birth: 1956-02-26

## 2018-10-20 NOTE — Progress Notes (Signed)
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  Mark Frey presented with symptoms of: It was an incidental finding when patient presented with a DVT.  Biopsies of RLQ peritoneum revealed: High grade B-cell lymphoma.   Past/Anticipated interventions by medical oncology, if any:  10/13/18 [name] PLAN: -Discussed pt labwork from 09/28/18; LDH normal at 139 and chemistries are normal. HGB improved to 11.1. PLT and WBC normal. ANC borderline low at 1.6k. -Discussed the 10/05/18 CT A/P which revealed Persistent wall thickening in the sigmoid colon although a component of this could be due to diverticulosis. There is also a suggestion of wall thickening and potential mucosal enhancement in the terminal ileum, as well as adjacent indistinct stranding in the mesenteric and omental adipose tissues roughly similar to the prior exam. Some of this may be treatment related. Inflammatory process such as Crohn's disease not excluded. 2. No current adenopathy. 3. Other imaging findings of potential clinical significance: Nonobstructive right nephrolithiasis. Aortic Atherosclerosis. Moderate prostatomegaly. Left foraminal impingement at L4-5. -Discussed the suspicious findings from the CT for a gastrointestinal inflammatory process ? Previous Crohns -Discussed that there is no new lymphadenopathy from the CT scan, and his LDH has continued to trend downwards and in the normal range, both of which are reassuring. No obvious disease progression, and as the patient has Burkitt's, this too is reassuring.  -Have discussed this patient's case with my colleague Dr. Isidore Moos in [place], and discussed with the pt and his wife the recommendation to pursue consolidative RT with Dr. Isidore Moos. -Discussed that re-anastomosis  considerations will be evaluated after completing RT -Recommended that the pt track his blood sugars with a glucometer at home, and then discuss his DM management further with his PCP, as the pt has lost weight -Continue Eliquis  -Feet elevation and compression sock use -Recommend consuming mixed nut powder and other magnesium rich foods -Did refer the pt to outpatient PT which he is following on -Did refer the pt to our nutritional therapist Ernestene Kiel - he has gained back his weight and is doing well. -Will see the pt back in 4 weeks    Weight changes, if any, over the past 6 months: He has lost about 60 lbs since starting chemotherapy and his bowel surgery, but is slowly gaining it back.   Recurrent fevers, or drenching night sweats, if any: No  SAFETY ISSUES:  Prior radiation? No  Pacemaker/ICD? No  Possible current pregnancy?  No  Is the patient on methotrexate? No  Current Complaints / other details:    BP 137/87   Pulse (!) 105   Temp 97.8 F (36.6 C) (Oral)   Wt 221 lb 3.2 oz (100.3 kg)   SpO2 98%   BMI 32.67 kg/m    Wt Readings from Last 3 Encounters:  10/30/18 221 lb 3.2 oz (100.3 kg)  10/18/18 220 lb (99.8 kg)  10/13/18 218 lb 1.6 oz (98.9 kg)

## 2018-10-23 ENCOUNTER — Telehealth: Payer: Self-pay | Admitting: Family Medicine

## 2018-10-23 NOTE — Telephone Encounter (Signed)
By the time I got to messages it was 10 25 and thought likely too late to call patient. Can you ask him what his question is- I may be able to respond back through you guys- or can hopefully call him sooner tomorrow than 10 25 PM! Please let him know sorry I wasn't able to get back to him sooner

## 2018-10-23 NOTE — Telephone Encounter (Signed)
See note  Copied from [place]. Topic: General - Other >> Oct 23, 2018 10:16 AM Lennox Solders wrote: Reason for CRM: pt does not want to make an appt. Pt is requesting dr hunter to return his call the end of day is ok. Pt would like to discuss janumet

## 2018-10-23 NOTE — Telephone Encounter (Signed)
Please advise 

## 2018-10-24 ENCOUNTER — Ambulatory Visit: Payer: BLUE CROSS/BLUE SHIELD

## 2018-10-24 DIAGNOSIS — M6281 Muscle weakness (generalized): Secondary | ICD-10-CM

## 2018-10-24 DIAGNOSIS — I890 Lymphedema, not elsewhere classified: Secondary | ICD-10-CM | POA: Diagnosis not present

## 2018-10-24 DIAGNOSIS — R293 Abnormal posture: Secondary | ICD-10-CM

## 2018-10-24 NOTE — Telephone Encounter (Signed)
Called patient. He states he is taking Janumet and he is interested in lowering his dosage or stopping the medication all together. His blood work shows that his numbers are low. He wants to lower the dosage or take it once a day instead of twice a day?

## 2018-10-24 NOTE — Telephone Encounter (Signed)
Discussed with patient.   Blood sugars are running below 100 on lab work  Patient would like to reduce dose of Janumet-we decided to reduce to once a day instead of twice a day  I asked him to see if oncology could order a hemoglobin A1c with his next blood work- we may even be able to reduce the dose further depending on how he does over coming weeks and months.

## 2018-10-24 NOTE — Therapy (Signed)
[place], [place], [address] Phone: [phone]   Fax:  [phone]  Physical Therapy Treatment  Patient Details  Name: Mark Frey MRN: [id number] Date of Birth: 12/03/1955 Referring Provider (PT): [name]    Encounter Date: 10/24/2018  PT End of Session - 10/24/18 1128    Visit Number  8    Number of Visits  17    Date for PT Re-Evaluation  11/28/18    PT Start Time  1107    PT Stop Time  1147    PT Time Calculation (min)  40 min    Activity Tolerance  Patient tolerated treatment well    Behavior During Therapy  [facility] for tasks assessed/performed       Past Medical History:  Diagnosis Date  . ALLERGIC RHINITIS   . Cancer ([place])    Lymphoma   . Diabetes mellitus   . Hyperlipidemia     Past Surgical History:  Procedure Laterality Date  . BIOPSY  03/15/2018   Procedure: BIOPSY;  Surgeon: Milus Banister, MD;  Location: [name];  Service: Endoscopy;;  . BOWEL RESECTION N/A 08/25/2018   Procedure: LAPROSCOPIC LOOP ILEOSTOMY;  Surgeon: Greer Pickerel, MD;  Location: [facility];  Service: General;  Laterality: N/A;  . CLEFT PALATE REPAIR    . COLONOSCOPY N/A 03/15/2018   Procedure: COLONOSCOPY;  Surgeon: Milus Banister, MD;  Location: [facility];  Service: Endoscopy;  Laterality: N/A;  . COLONOSCOPY N/A 08/20/2018   Procedure: COLONOSCOPY;  Surgeon: Irene Shipper, MD;  Location: [facility];  Service: Endoscopy;  Laterality: N/A;  . deviated septum repair     slight improvement  . ESOPHAGOGASTRODUODENOSCOPY N/A 03/15/2018   Procedure: ESOPHAGOGASTRODUODENOSCOPY (EGD);  Surgeon: Milus Banister, MD;  Location: [name];  Service: Endoscopy;  Laterality: N/A;  . IR IMAGING GUIDED PORT INSERTION  03/17/2018  . LAPAROSCOPY N/A 08/25/2018   Procedure: LAPAROSCOPY DIAGNOSTIC;  Surgeon: Greer Pickerel, MD;  Location: [facility];  Service: General;  Laterality: N/A;  . TONSILLECTOMY      There were  no vitals filed for this visit.  Subjective Assessment - 10/24/18 1108    Subjective  I feel good today. My back isn't bothering me anymore.     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  He has history of RLE and bilateral PE  He has 6 rounds of chemo and after that he had a bowel blockage that resulted in a temporarry colostomy 08/25/2018.  He is doing much better since that .  He has lost about 60 pounds  Past history included DM, past history of back pain that was resolved with chiropractics     Patient Stated Goals  to get stronger He wants to be able to squat and get up     Currently in Pain?  No/denies                       [facility] Adult PT Treatment/Exercise - 10/24/18 0001      Knee/Hip Exercises: Aerobic   Elliptical  Incline 6, level 1 x 9 mins    Tread Mill  1.9 mph x10 mins monitoring pt throughout      Knee/Hip Exercises: Machines for Strengthening   Cybex Knee Extension  10#, x20 with VCs for slower pace    Cybex Knee Flexion  25#, x20 reminding pt of technique and how to set up machine  Cybex Leg Press  35#, 2 x10       Shoulder Exercises: ROM/Strengthening   Lat Pull  20 reps   25#, 35# too heavy   Lat Pull Limitations  VCs to remind pt to limit end ROM to mid sternum to prevent forward rounding of shoulders and to maintain erect posture throughout    Cybex Row  20 reps   25#   Other ROM/Strengthening Exercises  Seated chest Press 25#, x20 (no feetl)               PT Short Term Goals - 09/29/18 1152      PT SHORT TERM GOAL #1   Title  Pt will report he can do a beginning home exercise program using heart rate reserve or rate of perceived exertion as an intensity guide     Time  4    Period  Weeks    Status  New      PT SHORT TERM GOAL #2   Title  Pt will increase sit to stand in 30 seconds to 11 indicating improvment in general strength     Time  4    Period  Weeks    Status  New         PT Long Term Goals - 09/29/18 1153      PT LONG TERM GOAL #1   Title  Pt will increase sit to stand in 30 seconds to 14 indicating improvment in general strength     Baseline  9    Time  8    Period  Weeks    Status  New      PT LONG TERM GOAL #2   Title  Pt will increase averge grip strength in each hand to 70 pounds indicating an improvement in general strength     Time  8    Period  Weeks    Status  New      PT LONG TERM GOAL #3   Title  Pt will decrease TUG to < 8 seconds indicating an improvment in functional balance and mobility     Time  8    Period  Weeks    Status  New      PT LONG TERM GOAL #4   Title  Pt will be able to get down to the floor and get back up so he can get in position to do some job duties     Time  8    Period  Weeks    Status  New      PT LONG TERM GOAL #5   Title  Pt will have a plan to continue with community exercise on his own at home     Time  8    Period  Weeks    Status  New            Plan - 10/24/18 1139    Clinical Impression Statement  Continued with aerobic activities and machines for strength. Pt conts to require education for how to adjust machines for his height and for proper technique/posture throughout for when he transitions to gym. He did very well with progression of exercises today and reports feeling encouraged by feeling like he's getting his endurance and strength back.     Rehab Potential  Excellent    Clinical Impairments Affecting Rehab Potential  colostomy     PT Frequency  2x / week    PT Duration  8 weeks  PT Treatment/Interventions  ADLs/Self Care Home Management;Gait training;Stair training;Functional mobility training;Balance training;Therapeutic exercise;Therapeutic activities;Patient/family education;Orthotic Fit/Training;Manual techniques;Taping    PT Next Visit Plan  Cont elliptical and treadmill for warm up, strengthening with machines and treadmill after. Can try machines in circuit and  progress reps as pt able.     Consulted and Agree with Plan of Care  Patient       Patient will benefit from skilled therapeutic intervention in order to improve the following deficits and impairments:  Postural dysfunction, Decreased mobility, Decreased activity tolerance, Decreased endurance, Decreased strength, Impaired perceived functional ability, Increased edema  Visit Diagnosis: Abnormal posture  Muscle weakness (generalized)  Lymphedema     Problem List Patient Active Problem List   Diagnosis Date Noted  . Abnormal CT of the abdomen   . Protein-calorie malnutrition, severe 08/19/2018  . 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 Diverticulitis of colon   . Low magnesium level 07/30/2018  . Hypokalemia 07/21/2018  . Bowel obstruction in setting of lymphoma and sigmoid stricture 07/21/2018  . Diarrhea 07/21/2018  . Burkitt lymphoma, lymph nodes of multiple sites ([place]) 05/22/2018  . Diabetes mellitus ([place])   . Edema   . Deep vein thrombosis (DVT) of femoral vein of right lower extremity ([place])   . Counseling regarding advance care planning and goals of care 04/25/2018  . Gastrointestinal hemorrhage   . Encounter for antineoplastic chemotherapy   . 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[place]) 04/10/2018  . Port-A-Cath in place 03/27/2018  . Acute deep vein thrombosis (DVT) of femoral vein of right lower extremity ([place])   . 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 Bilateral pulmonary embolism ([place]) 03/10/2018  . Anemia 03/10/2018  . Hypoglycemia 03/10/2018  . Occult blood in stools 03/10/2018  . Abdominal mass 03/10/2018  . Tachycardia 01/31/2017  . Controlled diabetes mellitus type II without complication ([place]) 70/14/1030  . Hypertension 06/05/2014  . Irritable bowel syndrome 03/30/2010  . Morbid obesity ([place]) 12/29/2009  . Dysmetabolic syndrome X 13/14/3888  . BACK PAIN WITH RADICULOPATHY 06/09/2007  . Hyperlipidemia 05/12/2007  .  ALLERGIC RHINITIS 05/12/2007    Otelia Limes, PTA 10/24/2018, 11:59 AM  [place], [place], [address] Phone: [phone]   Fax:  [phone]  Name: Mark Frey MRN: [id number] Date of Birth: 1955-11-15

## 2018-10-26 ENCOUNTER — Ambulatory Visit: Payer: BLUE CROSS/BLUE SHIELD

## 2018-10-26 DIAGNOSIS — M6281 Muscle weakness (generalized): Secondary | ICD-10-CM

## 2018-10-26 DIAGNOSIS — R293 Abnormal posture: Secondary | ICD-10-CM | POA: Diagnosis not present

## 2018-10-26 DIAGNOSIS — I890 Lymphedema, not elsewhere classified: Secondary | ICD-10-CM

## 2018-10-26 NOTE — Therapy (Signed)
[place], [place], [address] Phone: [phone]   Fax:  [phone]  Physical Therapy Treatment  Patient Details  Name: Mark Frey MRN: [id number] Date of Birth: Nov 28, 1955 Referring Provider (PT): [name]    Encounter Date: 10/26/2018  PT End of Session - 10/26/18 1201    Visit Number  9    Number of Visits  17    Date for PT Re-Evaluation  11/28/18    PT Start Time  1104    PT Stop Time  1147    PT Time Calculation (min)  43 min    Activity Tolerance  Patient tolerated treatment well    Behavior During Therapy  [facility] for tasks assessed/performed       Past Medical History:  Diagnosis Date  . ALLERGIC RHINITIS   . Cancer ([place])    Lymphoma   . Diabetes mellitus   . Hyperlipidemia     Past Surgical History:  Procedure Laterality Date  . BIOPSY  03/15/2018   Procedure: BIOPSY;  Surgeon: Milus Banister, MD;  Location: [name];  Service: Endoscopy;;  . BOWEL RESECTION N/A 08/25/2018   Procedure: LAPROSCOPIC LOOP ILEOSTOMY;  Surgeon: Greer Pickerel, MD;  [facility];  Service: General;  Laterality: N/A;  . CLEFT PALATE REPAIR    . COLONOSCOPY N/A 03/15/2018   Procedure: COLONOSCOPY;  Surgeon: Milus Banister, MD;  Location: [facility];  Service: Endoscopy;  Laterality: N/A;  . COLONOSCOPY N/A 08/20/2018   Procedure: COLONOSCOPY;  Surgeon: Irene Shipper, MD;  Location: [facility];  Service: Endoscopy;  Laterality: N/A;  . deviated septum repair     slight improvement  . ESOPHAGOGASTRODUODENOSCOPY N/A 03/15/2018   Procedure: ESOPHAGOGASTRODUODENOSCOPY (EGD);  Surgeon: Milus Banister, MD;  Location: [name];  Service: Endoscopy;  Laterality: N/A;  . IR IMAGING GUIDED PORT INSERTION  03/17/2018  . LAPAROSCOPY N/A 08/25/2018   Procedure: LAPAROSCOPY DIAGNOSTIC;  Surgeon: Greer Pickerel, MD;  Location: [facility];  Service: General;  Laterality: N/A;  . TONSILLECTOMY      There were  no vitals filed for this visit.  Subjective Assessment - 10/26/18 1120    Subjective  Doing well, nothing new.    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  He has history of RLE and bilateral PE  He has 6 rounds of chemo and after that he had a bowel blockage that resulted in a temporarry colostomy 08/25/2018.  He is doing much better since that .  He has lost about 60 pounds  Past history included DM, past history of back pain that was resolved with chiropractics     Patient Stated Goals  to get stronger He wants to be able to squat and get up     Currently in Pain?  No/denies         [facility] PT Assessment - 10/26/18 0001      Strength   Right Hand Grip (lbs)  74    Left Hand Grip (lbs)  70                   [facility] Adult PT Treatment/Exercise - 10/26/18 0001      Knee/Hip Exercises: Aerobic   Elliptical  Incline 10 (on ortho side) x4:30 mins and pt fatigued at that point so stopped.     Tread Mill  1.9 mph x10 mins       Knee/Hip Exercises: Machines for Strengthening  Cybex Knee Extension  25#, x20    Cybex Knee Flexion  35#, 2x10, VCs to remind pt for slow, controlled pacing    Cybex Leg Press  45#, x20      Shoulder Exercises: ROM/Strengthening   Lat Pull  15 reps   35#   Cybex Row  20 reps   25#   Cybex Row Limitations  Tactile cuing for scapular retraction at end ROM    Other ROM/Strengthening Exercises  Seated chest Press 25#, x20 (no feetl)               PT Short Term Goals - 09/29/18 1152      PT SHORT TERM GOAL #1   Title  Pt will report he can do a beginning home exercise program using heart rate reserve or rate of perceived exertion as an intensity guide     Time  4    Period  Weeks    Status  New      PT SHORT TERM GOAL #2   Title  Pt will increase sit to stand in 30 seconds to 11 indicating improvment in general strength     Time  4    Period  Weeks    Status  New        PT Long Term  Goals - 10/26/18 1123      PT LONG TERM GOAL #1   Title  Pt will increase sit to stand in 30 seconds to 14 indicating improvment in general strength     Baseline  9; 10-2/13/20    Status  On-going      PT LONG TERM GOAL #2   Title  Pt will increase averge grip strength in each hand to 70 pounds indicating an improvement in general strength     Baseline  Rt 74#, Lt 70# - 10/27/19    Status  Achieved      PT LONG TERM GOAL #3   Title  Pt will decrease TUG to < 8 seconds indicating an improvment in functional balance and mobility     Baseline  7.10 sec - 10/26/18    Status  Achieved      PT LONG TERM GOAL #4   Title  Pt will be able to get down to the floor and get back up so he can get in position to do some job duties     Baseline  Pt reports able to do this with little difficulty, just feels weak after-10/26/18    Status  Achieved      PT LONG TERM GOAL #5   Title  Pt will have a plan to continue with community exercise on his own at home     Baseline  Pt plans to join [employer] and continue exercising-10/26/18    Status  Partially Met            Plan - 10/26/18 1201    Clinical Impression Statement  Reassessed pts goals and he is making excellent progress overall. He has met 2/5 goals and is progressing well with weights on machines and thus far is being challenged by all exercises. Discussed with pt he will probably be ready for D/C in next 2 weeks or so as he is doing well and progressing well towards goals.      Rehab Potential  Excellent    Clinical Impairments Affecting Rehab Potential  colostomy     PT Frequency  2x / week    PT Duration  8 weeks    PT Treatment/Interventions  ADLs/Self Care Home Management;Gait training;Stair training;Functional mobility training;Balance training;Therapeutic exercise;Therapeutic activities;Patient/family education;Orthotic Fit/Training;Manual techniques;Taping    PT Next Visit Plan  Cont elliptical and treadmill for warm up,  strengthening with machines making sure pt is able to use them independently. Pt will probably be ready for D/C in next week or so.     Consulted and Agree with Plan of Care  Patient       Patient will benefit from skilled therapeutic intervention in order to improve the following deficits and impairments:  Postural dysfunction, Decreased mobility, Decreased activity tolerance, Decreased endurance, Decreased strength, Impaired perceived functional ability, Increased edema  Visit Diagnosis: Abnormal posture  Muscle weakness (generalized)  Lymphedema     Problem List Patient Active Problem List   Diagnosis Date Noted  . Abnormal CT of the abdomen   . Protein-calorie malnutrition, severe 08/19/2018  . 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 Diverticulitis of colon   . Low magnesium level 07/30/2018  . Hypokalemia 07/21/2018  . Bowel obstruction in setting of lymphoma and sigmoid stricture 07/21/2018  . Diarrhea 07/21/2018  . Burkitt lymphoma, lymph nodes of multiple sites ([place]) 05/22/2018  . Diabetes mellitus ([place])   . Edema   . Deep vein thrombosis (DVT) of femoral vein of right lower extremity ([place])   . Counseling regarding advance care planning and goals of care 04/25/2018  . Gastrointestinal hemorrhage   . Encounter for antineoplastic chemotherapy   . 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[place]) 04/10/2018  . Port-A-Cath in place 03/27/2018  . Acute deep vein thrombosis (DVT) of femoral vein of right lower extremity ([place])   . 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 Bilateral pulmonary embolism ([place]) 03/10/2018  . Anemia 03/10/2018  . Hypoglycemia 03/10/2018  . Occult blood in stools 03/10/2018  . Abdominal mass 03/10/2018  . Tachycardia 01/31/2017  . Controlled diabetes mellitus type II without complication ([place]) 80/22/3361  . Hypertension 06/05/2014  . Irritable bowel syndrome 03/30/2010  . Morbid obesity ([place]) 12/29/2009  . Dysmetabolic  syndrome X 22/44/9753  . BACK PAIN WITH RADICULOPATHY 06/09/2007  . Hyperlipidemia 05/12/2007  . ALLERGIC RHINITIS 05/12/2007    Otelia Limes, PTA 10/26/2018, 12:13 PM  [place], [place], [address] Phone: [phone]   Fax:  [phone]  Name: Mark Frey MRN: [id number] Date of Birth: 1955-12-28

## 2018-10-30 ENCOUNTER — Encounter: Payer: Self-pay | Admitting: Radiation Oncology

## 2018-10-30 ENCOUNTER — Other Ambulatory Visit: Payer: Self-pay

## 2018-10-30 ENCOUNTER — Ambulatory Visit
Admission: RE | Admit: 2018-10-30 | Discharge: 2018-10-30 | Disposition: A | Discharge status: Home / Self Care | Payer: BLUE CROSS/BLUE SHIELD | Source: Ambulatory Visit | Attending: Radiation Oncology | Admitting: Radiation Oncology

## 2018-10-30 VITALS — BP 137/87 | HR 105 | Temp 97.8°F | Wt 221.2 lb

## 2018-10-30 DIAGNOSIS — Z79899 Other long term (current) drug therapy: Secondary | ICD-10-CM | POA: Insufficient documentation

## 2018-10-30 DIAGNOSIS — Z9221 Personal history of antineoplastic chemotherapy: Secondary | ICD-10-CM | POA: Diagnosis not present

## 2018-10-30 DIAGNOSIS — Z510 Encounter for antineoplastic radiation therapy: Secondary | ICD-10-CM | POA: Insufficient documentation

## 2018-10-30 DIAGNOSIS — C8378 Burkitt lymphoma, lymph nodes of multiple sites: Secondary | ICD-10-CM | POA: Diagnosis not present

## 2018-10-30 DIAGNOSIS — C8583 Other specified types of non-Hodgkin lymphoma, intra-abdominal lymph nodes: Secondary | ICD-10-CM | POA: Diagnosis not present

## 2018-10-30 DIAGNOSIS — Z7901 Long term (current) use of anticoagulants: Secondary | ICD-10-CM | POA: Insufficient documentation

## 2018-10-30 DIAGNOSIS — C8510 Unspecified B-cell lymphoma, unspecified site: Secondary | ICD-10-CM

## 2018-10-31 ENCOUNTER — Ambulatory Visit: Payer: BLUE CROSS/BLUE SHIELD

## 2018-10-31 DIAGNOSIS — R293 Abnormal posture: Secondary | ICD-10-CM | POA: Diagnosis not present

## 2018-10-31 DIAGNOSIS — I890 Lymphedema, not elsewhere classified: Secondary | ICD-10-CM | POA: Diagnosis not present

## 2018-10-31 DIAGNOSIS — M6281 Muscle weakness (generalized): Secondary | ICD-10-CM | POA: Diagnosis not present

## 2018-10-31 NOTE — Therapy (Signed)
[place], [place], [address] Phone: [phone]   Fax:  [phone]  Physical Therapy Treatment  Patient Details  Name: Mark Frey MRN: [id number] Date of Birth: [birth year] Referring Provider (PT): [name]    Encounter Date: 10/31/2018  PT End of Session - 10/31/18 1104    Visit Number  10    Number of Visits  17    Date for PT Re-Evaluation  11/28/18    PT Start Time  1019    PT Stop Time  1059    PT Time Calculation (min)  40 min    Activity Tolerance  Patient tolerated treatment well    Behavior During Therapy  [facility] for tasks assessed/performed       Past Medical History:  Diagnosis Date  . ALLERGIC RHINITIS   . Cancer ([place])    Lymphoma   . Diabetes mellitus   . Hyperlipidemia     Past Surgical History:  Procedure Laterality Date  . BIOPSY  03/15/2018   Procedure: BIOPSY;  Surgeon: Milus Banister, MD;  Location: [name];  Service: Endoscopy;;  . BOWEL RESECTION N/A 08/25/2018   Procedure: LAPROSCOPIC LOOP ILEOSTOMY;  Surgeon: Greer Pickerel, MD;  Location: [facility];  Service: General;  Laterality: N/A;  . CLEFT PALATE REPAIR    . COLONOSCOPY N/A 03/15/2018   Procedure: COLONOSCOPY;  Surgeon: Milus Banister, MD;  [facility];  Service: Endoscopy;  Laterality: N/A;  . COLONOSCOPY N/A 08/20/2018   Procedure: COLONOSCOPY;  Surgeon: Irene Shipper, MD;  Location[facility];  Service: Endoscopy;  Laterality: N/A;  . deviated septum repair     slight improvement  . ESOPHAGOGASTRODUODENOSCOPY N/A 03/15/2018   Procedure: ESOPHAGOGASTRODUODENOSCOPY (EGD);  Surgeon: Milus Banister, MD;  Location: [name];  Service: Endoscopy;  Laterality: N/A;  . IR IMAGING GUIDED PORT INSERTION  03/17/2018  . LAPAROSCOPY N/A 08/25/2018   Procedure: LAPAROSCOPY DIAGNOSTIC;  Surgeon: Greer Pickerel, MD;  [facility];  Service: General;  Laterality: N/A;  . TONSILLECTOMY      There were  no vitals filed for this visit.  Subjective Assessment - 10/31/18 1023    Subjective  Felt good, endurance is improving. I can tell a big difference. I start radiation next Thursday and it'll be for 4 weeks.     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  He has history of RLE and bilateral PE  He has 6 rounds of chemo and after that he had a bowel blockage that resulted in a temporarry colostomy 08/25/2018.  He is doing much better since that .  He has lost about 60 pounds  Past history included DM, past history of back pain that was resolved with chiropractics     Patient Stated Goals  to get stronger He wants to be able to squat and get up     Currently in Pain?  No/denies                       [facility] Adult PT Treatment/Exercise - 10/31/18 0001      Knee/Hip Exercises: Aerobic   Elliptical  Incline 5 (on ortho side) x5 mins and pt fatigued at that point so stopped.     Tread Mill  1.9 mph x3 mins; 2.2 mph x8 mins; 2.0 mph x 4:30 mins, then 2.3 mph x mins at end of session      Knee/Hip Exercises: Machines for  Strengthening   Cybex Knee Extension  25#, x 25   still requires VCs for slower pace   Cybex Knee Flexion  35#, x25   VCs for slower, controlled pace   Cybex Leg Press  45#, x40      Shoulder Exercises: ROM/Strengthening   Lat Pull  20 reps   35#   Lat Pull Limitations  Instructed pt to make sure he doesn't allow pull at colostomy.     Cybex Row  20 reps   35#   Other ROM/Strengthening Exercises  Seated chest Press 25#, x20 (no feetl)               PT Short Term Goals - 09/29/18 1152      PT SHORT TERM GOAL #1   Title  Pt will report he can do a beginning home exercise program using heart rate reserve or rate of perceived exertion as an intensity guide     Time  4    Period  Weeks    Status  New      PT SHORT TERM GOAL #2   Title  Pt will increase sit to stand in 30 seconds to 11 indicating improvment  in general strength     Time  4    Period  Weeks    Status  New        PT Long Term Goals - 10/26/18 1123      PT LONG TERM GOAL #1   Title  Pt will increase sit to stand in 30 seconds to 14 indicating improvment in general strength     Baseline  9; 10-2/13/20    Status  On-going      PT LONG TERM GOAL #2   Title  Pt will increase averge grip strength in each hand to 70 pounds indicating an improvement in general strength     Baseline  Rt 74#, Lt 70# - 10/27/19    Status  Achieved      PT LONG TERM GOAL #3   Title  Pt will decrease TUG to < 8 seconds indicating an improvment in functional balance and mobility     Baseline  7.10 sec - 10/26/18    Status  Achieved      PT LONG TERM GOAL #4   Title  Pt will be able to get down to the floor and get back up so he can get in position to do some job duties     Baseline  Pt reports able to do this with little difficulty, just feels weak after-10/26/18    Status  Achieved      PT LONG TERM GOAL #5   Title  Pt will have a plan to continue with community exercise on his own at home     Baseline  Pt plans to join [employer] and continue exercising-10/26/18    Status  Partially Met            Plan - 10/31/18 1155    Clinical Impression Statement  Continued with focus to overall strengthening with machines allowing pt to determine if machines need to be adjusted to promote his independence with this for return to gym soon. Also he is tolerating increased weight and reps with most exercises. Began cautioning him today to be mindful of not doing any exercisdes that he can feel a pull at his colostomy site, especially since he is starting radiation next week and this area can become more sensitive. He verbalized understanding.  Rehab Potential  Excellent    Clinical Impairments Affecting Rehab Potential  colostomy     PT Frequency  2x / week    PT Duration  8 weeks    PT Treatment/Interventions  ADLs/Self Care Home Management;Gait  training;Stair training;Functional mobility training;Balance training;Therapeutic exercise;Therapeutic activities;Patient/family education;Orthotic Fit/Training;Manual techniques;Taping    PT Next Visit Plan  Cont elliptical and treadmill for warm up, strengthening with machines making sure pt is able to use them independently and progressing these as able. Pt will probably be ready for D/C in next week or so.     Consulted and Agree with Plan of Care  Patient       Patient will benefit from skilled therapeutic intervention in order to improve the following deficits and impairments:  Postural dysfunction, Decreased mobility, Decreased activity tolerance, Decreased endurance, Decreased strength, Impaired perceived functional ability, Increased edema  Visit Diagnosis: Abnormal posture  Muscle weakness (generalized)  Lymphedema     Problem List Patient Active Problem List   Diagnosis Date Noted  . Abnormal CT of the abdomen   . Protein-calorie malnutrition, severe 08/19/2018  . 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 Diverticulitis of colon   . Low magnesium level 07/30/2018  . Hypokalemia 07/21/2018  . Bowel obstruction in setting of lymphoma and sigmoid stricture 07/21/2018  . Diarrhea 07/21/2018  . Burkitt lymphoma, lymph nodes of multiple sites ([place]) 05/22/2018  . Diabetes mellitus ([place])   . Edema   . Deep vein thrombosis (DVT) of femoral vein of right lower extremity ([place])   . Counseling regarding advance care planning and goals of care 04/25/2018  . Gastrointestinal hemorrhage   . Encounter for antineoplastic chemotherapy   . 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[place]) 04/10/2018  . Port-A-Cath in place 03/27/2018  . Acute deep vein thrombosis (DVT) of femoral vein of right lower extremity ([place])   . 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 Bilateral pulmonary embolism ([place]) 03/10/2018  . Anemia 03/10/2018  . Hypoglycemia 03/10/2018  . Occult  blood in stools 03/10/2018  . Abdominal mass 03/10/2018  . Tachycardia 01/31/2017  . Controlled diabetes mellitus type II without complication ([place]) 07/37/1062  . Hypertension 06/05/2014  . Irritable bowel syndrome 03/30/2010  . Morbid obesity ([place]) 12/29/2009  . Dysmetabolic syndrome X 69/48/5462  . BACK PAIN WITH RADICULOPATHY 06/09/2007  . Hyperlipidemia 05/12/2007  . ALLERGIC RHINITIS 05/12/2007    Otelia Limes, PTA 10/31/2018, 11:59 AM  [place], [place], [address] Phone: [phone]   Fax:  [phone]  Name: Mark Frey MRN: [id number] Date of Birth: June 04, 1956

## 2018-10-31 NOTE — Progress Notes (Signed)
  Radiation Oncology         [phone] ________________________________  Name: Mark Frey MRN: [id number]  Date: 10/30/2018  DOB: 1955-10-08  SIMULATION AND TREATMENT PLANNING NOTE  Outpatient  DIAGNOSIS:     ICD-10-CM   1. 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 Burkitt lymphoma, lymph nodes of multiple sites [facility]) C83.78     NARRATIVE:  The patient was brought to the [facility].  Identity was confirmed. All relevant records and images related to the planned course of therapy were reviewed.The patient freely provided informed written consent to proceed with treatment after reviewing the details related to the planned course of therapy. The consent form was witnessed and verified by the simulation staff.    Then, the patient was set-up in a stable reproducible  Supine  position for radiation therapy.  CT images were obtained.  Surface markings were placed.  The CT images were loaded into the planning software.    TREATMENT PLANNING NOTE: Treatment planning then occurred.  The radiation prescription was entered and confirmed.   All relevant records and images related to the planned course of therapy were reviewed.  This included review of his pretreatment abdominal CT as well as his initial PET scan which was performed after chemotherapy had been initiated.  The patient's imaging is nonspecific and this presented challenges for defining his involved site.  I also reviewed the op note from his laparotomy.  I contoured the areas of bowel that were highest risk in my assessment.   I have also discussed treatment planning strategy with medical oncology  A total of 1 medically necessary complex treatment devices were fabricated and supervised by me, in the form of Vac Loc. MORE FIELDS WITH MLCs MAY BE ADDED IN DOSIMETRY for dose homogeneity.  I have requested : Intensity Modulated Radiotherapy (IMRT) is medically necessary for this case for the  following reason:  Kidney sparing, liver sparking, stomach sparing, bladder sparing.   The patient will receive 36 Gy in 20 fractions to the highest risk bowel (ISRT).  -----------------------------------  [name], MD

## 2018-10-31 NOTE — Progress Notes (Signed)
Radiation Oncology         [phone]) [phone] ________________________________  Name: Mark Frey MRN: [id number]  Date: 10/30/2018  DOB: [birth year]  Follow-Up Visit Note  Outpatient  CC: [name], MD  [name], MD  Diagnosis and Prior Radiotherapy:    ICD-10-CM   1. 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  After his consultation with me the patient's on medical oncology, [name].  [name] and I then spoke and the consensus was to proceed with radiotherapy to the patient's abdomen, targeting the most at risk sites (including a portion of the pelvis) to prevent local recurrence.  [name] does not feel that there is any role for further chemotherapy or biopsy.  He is confident that the patient has no obvious disease progression.  The patient is slowly gaining weight back.  He denies any new symptoms.                            ALLERGIES:  is allergic to ciprofloxacin.  Meds: Current Outpatient Medications  Medication Sig Dispense Refill  . acetaminophen (TYLENOL) 325 MG tablet Take 2 tablets (650 mg total) by mouth every 6 (six) hours as needed.    . blood glucose meter kit and supplies Dispense based on patient and insurance preference. Use daily as directed. (E11.9). 1 each 0  . ELIQUIS 5 MG TABS tablet TAKE 1 TABLET BY MOUTH TWICE A DAY 60 tablet 3  . furosemide (LASIX) 20 MG tablet Take 20 mg by mouth as needed.    [name] glucose blood (FREESTYLE TEST STRIPS) test strip Use to check blood sugar daily 100 each 4  . JANUMET 50-1000 MG tablet TAKE 1 TABLET TWICE DAILY  WITH MEALS (Patient taking differently: Take by mouth daily. Patient taking it once daily) 180 tablet 3  . lidocaine-prilocaine (EMLA) cream Apply 1 application topically as needed. 30 g 2  . Magnesium 500 MG  CAPS Take 1 capsule (500 mg total) by mouth 2 (two) times daily. (Patient taking differently: Take 500 mg by mouth daily. ) 60 capsule 0  . Probiotic Product (PROBIOTIC-10) CAPS Take 1 capsule by mouth daily.     No current facility-administered medications for this encounter.     Physical Findings: The patient is in no acute distress. Patient is alert and oriented.  weight is 221 lb 3.2 oz (100.3 kg). His oral temperature is 97.8 F (36.6 C). His blood pressure is 137/87 and his pulse is 105 (abnormal). His oxygen saturation is 98%. .      Lab Findings: Lab Results  Component Value Date   WBC 4.9 09/28/2018   HGB 11.1 (L) 09/28/2018   HCT 35.9 (L) 09/28/2018   MCV 104.7 (H) 09/28/2018   PLT 203 09/28/2018    Radiographic Findings: No results found.  Impression/Plan: Today we will proceed with CT simulation of the patient's abdomen and pelvis.  I will use the patient's pretreatment CT scan as well as his PET scan which is of limited utility because it was performed after his first cycle of chemotherapy.  However these images will help estimate the involved site and I will target a good portion of the abdomen and pelvis to try to prevent local recurrence.  Tentatively we plan to deliver 36 Gy in 20 fractions but if he has significant problems with tolerating this we can certainly back off as needed.  Patient and his wife are enthusiastic about this plan.  Consent have been signed.  We discussed the risks benefits and side effects of radiotherapy to the abdomen pelvis which include but are not necessarily limited to nausea, diarrhea, fatigue, injury to the bowel, injury to the stomach liver or kidneys, skin irritation, urinary or gastrointestinal irritability.  I plan to use IMRT which is medically necessary to maximize sparing of the patient's kidneys liver and stomach and bladder.   I spent 15 minutes face to face with the patient and more than 50% of that time was spent in counseling  and/or coordination of care. _____________________________________   [name], MD

## 2018-11-02 ENCOUNTER — Ambulatory Visit: Payer: BLUE CROSS/BLUE SHIELD

## 2018-11-02 DIAGNOSIS — R293 Abnormal posture: Secondary | ICD-10-CM

## 2018-11-02 DIAGNOSIS — I890 Lymphedema, not elsewhere classified: Secondary | ICD-10-CM | POA: Diagnosis not present

## 2018-11-02 DIAGNOSIS — M6281 Muscle weakness (generalized): Secondary | ICD-10-CM

## 2018-11-02 NOTE — Therapy (Addendum)
[place], [place], [address] Phone: [phone]   Fax:  [phone]  Physical Therapy Treatment  Patient Details  Name: Mark Frey MRN: [id number] Date of Birth: [birth year] Referring Provider (PT): [name]    Encounter Date: 11/02/2018  PT End of Session - 11/02/18 1019    Visit Number  11    Number of Visits  17    Date for PT Re-Evaluation  11/28/18    PT Start Time  0941    PT Stop Time  1019    PT Time Calculation (min)  38 min    Activity Tolerance  Patient tolerated treatment well    Behavior During Therapy  [facility] for tasks assessed/performed       Past Medical History:  Diagnosis Date  . ALLERGIC RHINITIS   . Cancer ([place])    Lymphoma   . Diabetes mellitus   . Hyperlipidemia     Past Surgical History:  Procedure Laterality Date  . BIOPSY  03/15/2018   Procedure: BIOPSY;  Surgeon: Milus Banister, MD;  Location: [name];  Service: Endoscopy;;  . BOWEL RESECTION N/A 08/25/2018   Procedure: LAPROSCOPIC LOOP ILEOSTOMY;  Surgeon: Greer Pickerel, MD;  [facility];  Service: General;  Laterality: N/A;  . CLEFT PALATE REPAIR    . COLONOSCOPY N/A 03/15/2018   Procedure: COLONOSCOPY;  Surgeon: Milus Banister, MD;  [facility];  Service: Endoscopy;  Laterality: N/A;  . COLONOSCOPY N/A 08/20/2018   Procedure: COLONOSCOPY;  Surgeon: Irene Shipper, MD;  [facility];  Service: Endoscopy;  Laterality: N/A;  . deviated septum repair     slight improvement  . ESOPHAGOGASTRODUODENOSCOPY N/A 03/15/2018   Procedure: ESOPHAGOGASTRODUODENOSCOPY (EGD);  Surgeon: Milus Banister, MD;  Location: [name];  Service: Endoscopy;  Laterality: N/A;  . IR IMAGING GUIDED PORT INSERTION  03/17/2018  . LAPAROSCOPY N/A 08/25/2018   Procedure: LAPAROSCOPY DIAGNOSTIC;  Surgeon: Greer Pickerel, MD;  [facility];  Service: General;  Laterality: N/A;  . TONSILLECTOMY      There were  no vitals filed for this visit.  Subjective Assessment - 11/02/18 0947    Subjective  Doing good, nothing new since last visit.    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  He has history of RLE and bilateral PE  He has 6 rounds of chemo and after that he had a bowel blockage that resulted in a temporarry colostomy 08/25/2018.  He is doing much better since that .  He has lost about 60 pounds  Past history included DM, past history of back pain that was resolved with chiropractics     Patient Stated Goals  to get stronger He wants to be able to squat and get up     Currently in Pain?  No/denies                       [facility] Adult PT Treatment/Exercise - 11/02/18 0001      Knee/Hip Exercises: Aerobic   Elliptical  Incline 5, level 1 x7 mins    Tread Mill  2.0 mph x5 mins; 2.0 mph x 17mns      Knee/Hip Exercises: Machines for Strengthening   Cybex Knee Extension  25#, x25    Cybex Knee Flexion  35#, x25    Cybex Leg Press  45#, x50      Shoulder Exercises: ROM/Strengthening   Lat Pull  20 reps   35#   Cybex Row  20 reps   35#   Cybex Row Limitations  Tried horizontal handles, but pt rounds shoulders so continued with vertical    Other ROM/Strengthening Exercises  Seated chest Press 25#, x20 (no feetl)               PT Short Term Goals - 09/29/18 1152      PT SHORT TERM GOAL #1   Title  Pt will report he can do a beginning home exercise program using heart rate reserve or rate of perceived exertion as an intensity guide     Time  4    Period  Weeks    Status  New      PT SHORT TERM GOAL #2   Title  Pt will increase sit to stand in 30 seconds to 11 indicating improvment in general strength     Time  4    Period  Weeks    Status  New        PT Long Term Goals - 10/26/18 1123      PT LONG TERM GOAL #1   Title  Pt will increase sit to stand in 30 seconds to 14 indicating improvment in general strength      Baseline  9; 10-2/13/20    Status  On-going      PT LONG TERM GOAL #2   Title  Pt will increase averge grip strength in each hand to 70 pounds indicating an improvement in general strength     Baseline  Rt 74#, Lt 70# - 10/27/19    Status  Achieved      PT LONG TERM GOAL #3   Title  Pt will decrease TUG to < 8 seconds indicating an improvment in functional balance and mobility     Baseline  7.10 sec - 10/26/18    Status  Achieved      PT LONG TERM GOAL #4   Title  Pt will be able to get down to the floor and get back up so he can get in position to do some job duties     Baseline  Pt reports able to do this with little difficulty, just feels weak after-10/26/18    Status  Achieved      PT LONG TERM GOAL #5   Title  Pt will have a plan to continue with community exercise on his own at home     Baseline  Pt plans to join [employer] and continue exercising-10/26/18    Status  Partially Met            Plan - 11/02/18 1020    Clinical Impression Statement  Pt some fatigued today, possibly from increasing weights/reps at last session. So did not increase any weights today but he was able to cont with same from last session. He is pleased with his progress and plans to join [employer] soon.     Rehab Potential  Excellent    Clinical Impairments Affecting Rehab Potential  colostomy     PT Frequency  2x / week    PT Duration  8 weeks    PT Treatment/Interventions  ADLs/Self Care Home Management;Gait training;Stair training;Functional mobility training;Balance training;Therapeutic exercise;Therapeutic activities;Patient/family education;Orthotic Fit/Training;Manual techniques;Taping    PT Next Visit Plan  Cont elliptical and treadmill for warm up, strengthening with machines making sure pt is able to use them independently and progressing these as able. Pt will probably be ready  for D/C in next week or so.     Consulted and Agree with Plan of Care  Patient       Patient will  benefit from skilled therapeutic intervention in order to improve the following deficits and impairments:  Postural dysfunction, Decreased mobility, Decreased activity tolerance, Decreased endurance, Decreased strength, Impaired perceived functional ability, Increased edema  Visit Diagnosis: Abnormal posture  Muscle weakness (generalized)  Lymphedema     Problem List Patient Active Problem List   Diagnosis Date Noted  . Abnormal CT of the abdomen   . Protein-calorie malnutrition, severe 08/19/2018  . 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 Diverticulitis of colon   . Low magnesium level 07/30/2018  . Hypokalemia 07/21/2018  . Bowel obstruction in setting of lymphoma and sigmoid stricture 07/21/2018  . Diarrhea 07/21/2018  . Burkitt lymphoma, lymph nodes of multiple sites ([place]) 05/22/2018  . Diabetes mellitus ([place])   . Edema   . Deep vein thrombosis (DVT) of femoral vein of right lower extremity ([place])   . Counseling regarding advance care planning and goals of care 04/25/2018  . Gastrointestinal hemorrhage   . Encounter for antineoplastic chemotherapy   . 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 Port-A-Cath in place 03/27/2018  . Acute deep vein thrombosis (DVT) of femoral vein of right lower extremity ([place])   . 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[place]) 03/15/2018  . Bilateral pulmonary embolism ([place]) 03/10/2018  . Anemia 03/10/2018  . Hypoglycemia 03/10/2018  . Occult blood in stools 03/10/2018  . Abdominal mass 03/10/2018  . Tachycardia 01/31/2017  . Controlled diabetes mellitus type II without complication ([place]) 68/04/8109  . Hypertension 06/05/2014  . Irritable bowel syndrome 03/30/2010  . Morbid obesity ([place]) 12/29/2009  . Dysmetabolic syndrome X 31/59/4585  . BACK PAIN WITH RADICULOPATHY 06/09/2007  . Hyperlipidemia 05/12/2007  . ALLERGIC RHINITIS 05/12/2007    Otelia Limes, PTA 11/02/2018, 10:33 AM  [place], [place], [address] Phone: [phone]   Fax:  [phone]  Name: Mark Frey MRN: [id number] Date of Birth: [birth year]

## 2018-11-03 ENCOUNTER — Telehealth: Payer: Self-pay | Admitting: *Deleted

## 2018-11-03 DIAGNOSIS — C8378 Burkitt lymphoma, lymph nodes of multiple sites: Secondary | ICD-10-CM | POA: Diagnosis not present

## 2018-11-03 DIAGNOSIS — Z510 Encounter for antineoplastic radiation therapy: Secondary | ICD-10-CM | POA: Diagnosis not present

## 2018-11-03 NOTE — Telephone Encounter (Signed)
Spouse called. If husband is to continue Lasix 20 mg daily, will need new RX. Was originally prescribed when inpatient. Also PCP asked if Hgb A1c could be added to next week's labs so that patient does not have to have labs twice.  Per [name] - will review need for Lasix refill at next week's appt. [name] asked if patient could have PCP enter lab test into Epic and then it will be drawn attached to PCP for results. Contacted spouse who verbalized understanding of answers.

## 2018-11-03 NOTE — Telephone Encounter (Signed)
Spoke to Mark Frey and she stated that her Husband will need an A1c lab draw placed for [facility] to draw pt blood. [name] stated that the oncologist stated he wouldn't mind adding the order but the pt may get a bill if insurance does not cover it. Oncologist stated when he place labs that's outside of his specialty the pt may get a bill.    Please advise

## 2018-11-03 NOTE — Telephone Encounter (Signed)
Please speak with lab- you can order future a1c under diabetes on his problem list- ask how we can order it so [facility] can release it

## 2018-11-03 NOTE — Telephone Encounter (Signed)
Spoke to [employer] at [facility] and she stated an order has to be faxed. [name] notified of update.

## 2018-11-03 NOTE — Telephone Encounter (Signed)
Tillie Rung- write this on paper script then with order as well as icd 10 code for his diabetes. I will sign and you can fax.

## 2018-11-03 NOTE — Telephone Encounter (Signed)
Pt's wife [name] requesting a callback from [facility] regarding pt's A1C. Please advise. CB#[phone]

## 2018-11-03 NOTE — Telephone Encounter (Signed)
See note

## 2018-11-06 ENCOUNTER — Encounter: Payer: Self-pay | Admitting: Radiation Oncology

## 2018-11-06 NOTE — Telephone Encounter (Signed)
Noted! Lab request faxed to [facility] with "Okay" confirmation.

## 2018-11-07 ENCOUNTER — Ambulatory Visit: Payer: BLUE CROSS/BLUE SHIELD

## 2018-11-07 DIAGNOSIS — M6281 Muscle weakness (generalized): Secondary | ICD-10-CM

## 2018-11-07 DIAGNOSIS — I890 Lymphedema, not elsewhere classified: Secondary | ICD-10-CM

## 2018-11-07 DIAGNOSIS — R293 Abnormal posture: Secondary | ICD-10-CM | POA: Diagnosis not present

## 2018-11-07 NOTE — Therapy (Signed)
[place], [place], [address] Phone: [phone]   Fax:  [phone]  Physical Therapy Treatment  Patient Details  Name: Mark Frey MRN: [id number] Date of Birth: 08/24/1956 Referring Provider (PT): [name]    Encounter Date: 11/07/2018  PT End of Session - 11/07/18 1156    Visit Number  12    Number of Visits  17    Date for PT Re-Evaluation  11/28/18    PT Start Time  1109    PT Stop Time  1151    PT Time Calculation (min)  42 min    Activity Tolerance  Patient tolerated treatment well    Behavior During Therapy  [facility] for tasks assessed/performed       Past Medical History:  Diagnosis Date  . ALLERGIC RHINITIS   . Cancer ([place])    Lymphoma   . Diabetes mellitus   . Hyperlipidemia     Past Surgical History:  Procedure Laterality Date  . BIOPSY  03/15/2018   Procedure: BIOPSY;  Surgeon: Milus Banister, MD;  Location: [name];  Service: Endoscopy;;  . BOWEL RESECTION N/A 08/25/2018   Procedure: LAPROSCOPIC LOOP ILEOSTOMY;  Surgeon: Greer Pickerel, MD;  Location: [facility];  Service: General;  Laterality: N/A;  . CLEFT PALATE REPAIR    . COLONOSCOPY N/A 03/15/2018   Procedure: COLONOSCOPY;  Surgeon: Milus Banister, MD;  Location: [facility];  Service: Endoscopy;  Laterality: N/A;  . COLONOSCOPY N/A 08/20/2018   Procedure: COLONOSCOPY;  Surgeon: Irene Shipper, MD;  Location: [facility];  Service: Endoscopy;  Laterality: N/A;  . deviated septum repair     slight improvement  . ESOPHAGOGASTRODUODENOSCOPY N/A 03/15/2018   Procedure: ESOPHAGOGASTRODUODENOSCOPY (EGD);  Surgeon: Milus Banister, MD;  Location: [name];  Service: Endoscopy;  Laterality: N/A;  . IR IMAGING GUIDED PORT INSERTION  03/17/2018  . LAPAROSCOPY N/A 08/25/2018   Procedure: LAPAROSCOPY DIAGNOSTIC;  Surgeon: Greer Pickerel, MD;  [facility]: [facility];  Service: General;  Laterality: N/A;  . TONSILLECTOMY      There were  no vitals filed for this visit.  Subjective Assessment - 11/07/18 1110    Subjective  Start radiation Thursday.     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  He has history of RLE and bilateral PE  He has 6 rounds of chemo and after that he had a bowel blockage that resulted in a temporarry colostomy 08/25/2018.  He is doing much better since that .  He has lost about 60 pounds  Past history included DM, past history of back pain that was resolved with chiropractics     Patient Stated Goals  to get stronger He wants to be able to squat and get up     Currently in Pain?  No/denies                       [facility] Adult PT Treatment/Exercise - 11/07/18 0001      Knee/Hip Exercises: Aerobic   Elliptical  Incline 5, level 1 x7 mins    Tread Mill  2.0 mph x6 mins; 2.0 mph x 10 mins at end of session. Discussed pts progress thus far and joining [employer] possibly this week.       Knee/Hip Exercises: Machines for Strengthening   Cybex Knee Extension  25#, x25   pt with better control and pacing today   Cybex Knee Flexion  35#,  x25   VCs for full ROM   Cybex Leg Press  45# , x50      Shoulder Exercises: ROM/Strengthening   Lat Pull  20 reps   35#   Lat Pull Limitations  VCs to remind pt of full flexion    Cybex Row  20 reps   35#   Other ROM/Strengthening Exercises  Seated chest Press 25#, x20 (no feetl)               PT Short Term Goals - 09/29/18 1152      PT SHORT TERM GOAL #1   Title  Pt will report he can do a beginning home exercise program using heart rate reserve or rate of perceived exertion as an intensity guide     Time  4    Period  Weeks    Status  New      PT SHORT TERM GOAL #2   Title  Pt will increase sit to stand in 30 seconds to 11 indicating improvment in general strength     Time  4    Period  Weeks    Status  New        PT Long Term Goals - 10/26/18 1123      PT LONG TERM GOAL #1    Title  Pt will increase sit to stand in 30 seconds to 14 indicating improvment in general strength     Baseline  9; 10-2/13/20    Status  On-going      PT LONG TERM GOAL #2   Title  Pt will increase averge grip strength in each hand to 70 pounds indicating an improvement in general strength     Baseline  Rt 74#, Lt 70# - 10/27/19    Status  Achieved      PT LONG TERM GOAL #3   Title  Pt will decrease TUG to < 8 seconds indicating an improvment in functional balance and mobility     Baseline  7.10 sec - 10/26/18    Status  Achieved      PT LONG TERM GOAL #4   Title  Pt will be able to get down to the floor and get back up so he can get in position to do some job duties     Baseline  Pt reports able to do this with little difficulty, just feels weak after-10/26/18    Status  Achieved      PT LONG TERM GOAL #5   Title  Pt will have a plan to continue with community exercise on his own at home     Baseline  Pt plans to join [employer] and continue exercising-10/26/18    Status  Partially Met            Plan - 11/07/18 1157    Clinical Impression Statement  Pt did well and was able to tolerate same weights and reps today with no rest periods. Also able to increase cardio from lsat visit. He reports feeling about ready to transition to community exercise by joining [employer] and his wife plans to join with him as well. Overall pt has made excellent progress with improving strength and endurance.     Rehab Potential  Excellent    Clinical Impairments Affecting Rehab Potential  colostomy     PT Frequency  2x / week    PT Duration  8 weeks    PT Treatment/Interventions  ADLs/Self Care Home Management;Gait training;Stair training;Functional mobility training;Balance  training;Therapeutic exercise;Therapeutic activities;Patient/family education;Orthotic Fit/Training;Manual techniques;Taping    PT Next Visit Plan  See if pt joined [employer] yet or called? Review all goals and see  if pt ready to D/C or return 1-2 visits over next few weeks?? Continue with strengthening with weights and cardio equipment progressing pt as able.     Consulted and Agree with Plan of Care  Patient       Patient will benefit from skilled therapeutic intervention in order to improve the following deficits and impairments:  Postural dysfunction, Decreased mobility, Decreased activity tolerance, Decreased endurance, Decreased strength, Impaired perceived functional ability, Increased edema  Visit Diagnosis: Abnormal posture  Muscle weakness (generalized)  Lymphedema     Problem List Patient Active Problem List   Diagnosis Date Noted  . Abnormal CT of the abdomen   . Protein-calorie malnutrition, severe 08/19/2018  . 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 Diverticulitis of colon   . Low magnesium level 07/30/2018  . Hypokalemia 07/21/2018  . Bowel obstruction in setting of lymphoma and sigmoid stricture 07/21/2018  . Diarrhea 07/21/2018  . Burkitt lymphoma, lymph nodes of multiple sites ([place]) 05/22/2018  . Diabetes mellitus ([place])   . Edema   . Deep vein thrombosis (DVT) of femoral vein of right lower extremity ([place])   . Counseling regarding advance care planning and goals of care 04/25/2018  . Gastrointestinal hemorrhage   . Encounter for antineoplastic chemotherapy   . 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 Port-A-Cath in place 03/27/2018  . Acute deep vein thrombosis (DVT) of femoral vein of right lower extremity ([place])   . 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[place]) 03/15/2018  . Bilateral pulmonary embolism ([place]) 03/10/2018  . Anemia 03/10/2018  . Hypoglycemia 03/10/2018  . Occult blood in stools 03/10/2018  . Abdominal mass 03/10/2018  . Tachycardia 01/31/2017  . Controlled diabetes mellitus type II without complication ([place]) 83/35/8251  . Hypertension 06/05/2014  . Irritable bowel syndrome 03/30/2010  . Morbid obesity ([place]) 12/29/2009   . Dysmetabolic syndrome X 89/84/2103  . BACK PAIN WITH RADICULOPATHY 06/09/2007  . Hyperlipidemia 05/12/2007  . ALLERGIC RHINITIS 05/12/2007    Otelia Limes, PTA 11/07/2018, 12:05 PM  [place], [place], [address] Phone: [phone]   Fax:  [phone]  Name: Mark Frey MRN: [id number] Date of Birth: Mar 09, 1956

## 2018-11-08 NOTE — Progress Notes (Signed)
HEMATOLOGY/ONCOLOGY CLINIC NOTE  Date of Service: 11/09/18    Patient Care Team: [name], MD as PCP - General (Family Medicine)  CHIEF COMPLAINTS/PURPOSE OF CONSULTATION:  F/u for continued Mx of Burkitts lymphoma  HISTORY OF PRESENTING ILLNESS:   Mark Frey is a wonderful 63 y.o. male who has been referred to [place] by my colleague Dr. 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 He is accompanied today by his wife. The pt reports that he is doing well overall.   The pt started R-EPOCH every 3 weeks with neulasta support on 03/17/18. He presented to the ED with right leg swelling, found to be a DVT and bilateral PE, which resulted in the incidental finding of a lower abdomen tumor measuring 18.7 x 18.5 x 17.8 cm. He denies any abdominal pains or changes in his bowel habits prior to this finding. He first noted blood in the stools 4-5 days prior to appearing to the ED.  The pt reports that he has been taking 1107m Lovenox injections once each day, except for the days in which he had blood in the stools. He hasn't had blood in his stools for the last 7 days.   He has had GI bleed related to bowel involvement with his lymphoma - this is now resolving and his hgb is stabilizing.  He notes that his scrotum and leg swelling has decreased. He denies having CP or SOB at any point from his PE.   Most recent lab results (04/06/18) of CBC w/diff, CMP, Reticulocytes  is as follows: all values are WNL except for WBC at 15.3k, RBC at 3.65, HGB at 10.5, HCT at 31.4, RDW at 17.8, PLT at 640k, ANC at 12.7k, Monocytes abs at 1.6k, Glucose at 162, Total Protein at 6.2, Albumin at 3.1, Total bilirubin at <0.2, Retic ct pct at 2.5%. Uric acid 04/06/18 was low at 3.0 LDH 04/06/18 elevated at 251  On review of systems, pt reports remaining right leg swelling, blood in the stools 7 days ago, two  bowel movements each day, left leg swelling, improved scrotal swelling, eating well, and denies problems with his port, abdominal pains, constipation, diarrhea, jaw pain, pain along the spine, problems passing urine, and any other symptoms.  Interval History:   Mark SCHUBERTreturns today for management and evaluation of his Burkitt's lymphoma. The patient's last [place] with [place] on 10[place]/18. He is accompanied today by his wife. The pt reports that he is doing well overall.   The pt began RT today, 11/09/18, and is final expected treatment is 12/06/18. He notes no problems tolerating his first fraction today.  The pt reports that he has been eating well and endorses gaining 7 pounds in the last month. He denies issues with constipation, bloating, and issues with his ileostomy. The pt notes that he finished all of his Lasix last week and denies problems with leg swelling.   The pt has been going to outpatient PT and has found this useful and endorses "dramatic" strength gain. He notes that he will be establishing a gym membership this week.  Lab results today (11/09/18) of CBC w/diff is as follows: all values are WNL except for RBC at 3.71, HGB at 12.0, HCT at 37.6, MCV at 101.3. 11/09/18 Magnesium is 1.5  On review of systems, pt reports eating well, some weight gain, good energy levels, increased activity levels,  moving his bowels, and denies leg swelling, constipation, bloating, mouth sores, abdominal pain, and any other symptoms.  MEDICAL HISTORY:  Past Medical History:  Diagnosis Date  . ALLERGIC RHINITIS   . Cancer ([place])    Lymphoma   . Diabetes mellitus   . Hyperlipidemia     SURGICAL HISTORY: Past Surgical History:  Procedure Laterality Date  . BIOPSY  03/15/2018   Procedure: BIOPSY;  Surgeon: Milus Banister, MD;  Location: [name];  Service: Endoscopy;;  . BOWEL RESECTION N/A 08/25/2018   Procedure: LAPROSCOPIC LOOP ILEOSTOMY;  Surgeon: Greer Pickerel, MD;  Location: [facility];   Service: General;  Laterality: N/A;  . CLEFT PALATE REPAIR    . COLONOSCOPY N/A 03/15/2018   Procedure: COLONOSCOPY;  Surgeon: Milus Banister, MD;  Location: [facility];  Service: Endoscopy;  Laterality: N/A;  . COLONOSCOPY N/A 08/20/2018   Procedure: COLONOSCOPY;  Surgeon: Irene Shipper, MD;  Location: [facility];  Service: Endoscopy;  Laterality: N/A;  . deviated septum repair     slight improvement  . ESOPHAGOGASTRODUODENOSCOPY N/A 03/15/2018   Procedure: ESOPHAGOGASTRODUODENOSCOPY (EGD);  Surgeon: Milus Banister, MD;  Location: [name];  Service: Endoscopy;  Laterality: N/A;  . IR IMAGING GUIDED PORT INSERTION  03/17/2018  . LAPAROSCOPY N/A 08/25/2018   Procedure: LAPAROSCOPY DIAGNOSTIC;  Surgeon: Greer Pickerel, MD;  Location: [facility];  Service: General;  Laterality: N/A;  . TONSILLECTOMY      SOCIAL HISTORY: Social History   Socioeconomic History  . Marital status: Married    Spouse name: lisa  . Number of children: 0  . Years of education: Not on file  . Highest education level: Not on file  Occupational History  . Not on file  Social Needs  . Financial resource strain: Not hard at all  . Food insecurity:    Worry: Never true    Inability: Never true  . Transportation needs:    Medical: No    Non-medical: No  Tobacco Use  . Smoking status: Never Smoker  . Smokeless tobacco: Never Used  Substance and Sexual Activity  . Alcohol use: Yes    Comment: occasional  . Drug use: No  . Sexual activity: Yes  Lifestyle  . Physical activity:    Days per week: 0 days    Minutes per session: 0 min  . Stress: Not at all  Relationships  . Social connections:    Talks on phone: More than three times a week    Gets together: More than three times a week    Attends religious service: 1 to 4 times per year    Active member of club or organization: No    Attends meetings of clubs or organizations: Never    Relationship status: Married  . Intimate partner violence:     Fear of current or ex partner: No    Emotionally abused: No    Physically abused: No    Forced sexual activity: No  Other Topics Concern  . Not on file  Social History Narrative   Married 1985. No kids. 4 small dogs.       Works in Financial trader, residential      Hobbies: work on cars, Haematologist, exercise as able    FAMILY HISTORY: Family History  Problem Relation Age of Onset  . Lung cancer Mother        smoker  . Brain cancer Mother        metastasis  . AAA (abdominal aortic aneurysm)  Father        smoker    ALLERGIES:  is allergic to ciprofloxacin.  MEDICATIONS:  Current Outpatient Medications  Medication Sig Dispense Refill  . acetaminophen (TYLENOL) 325 MG tablet Take 2 tablets (650 mg total) by mouth every 6 (six) hours as needed.    . blood glucose meter kit and supplies Dispense based on patient and insurance preference. Use daily as directed. (E11.9). 1 each 0  . ELIQUIS 5 MG TABS tablet TAKE 1 TABLET BY MOUTH TWICE A DAY 60 tablet 3  . furosemide (LASIX) 20 MG tablet Take 20 mg by mouth as needed.    [name] glucose blood (FREESTYLE TEST STRIPS) test strip Use to check blood sugar daily 100 each 4  . JANUMET 50-1000 MG tablet TAKE 1 TABLET TWICE DAILY  WITH MEALS (Patient taking differently: Take by mouth daily. Patient taking it once daily) 180 tablet 3  . lidocaine-prilocaine (EMLA) cream Apply 1 application topically as needed. 30 g 2  . Magnesium 500 MG CAPS Take 1 capsule (500 mg total) by mouth 2 (two) times daily. (Patient taking differently: Take 500 mg by mouth daily. ) 60 capsule 0  . Probiotic Product (PROBIOTIC-10) CAPS Take 1 capsule by mouth daily.     No current facility-administered medications for this visit.     REVIEW OF SYSTEMS:    A 10+ POINT REVIEW OF SYSTEMS WAS OBTAINED including neurology, dermatology, psychiatry, cardiac, respiratory, lymph, extremities, GI, GU, Musculoskeletal, constitutional, breasts, reproductive, HEENT.  All  pertinent positives are noted in the HPI.  All others are negative.   PHYSICAL EXAMINATION: ECOG PERFORMANCE STATUS: 1 - Symptomatic but completely ambulatory  Vitals:   11/09/18 1018  BP: 135/85  Pulse: 61  Resp: 18  Temp: 98 F (36.7 C)  SpO2: 95%   Filed Weights   11/09/18 1018  Weight: 225 lb 12.8 oz (102.4 kg)   .Body mass index is 33.34 kg/m.  GENERAL:alert, in no acute distress and comfortable SKIN: no acute rashes, no significant lesions EYES: conjunctiva are pink and non-injected, sclera anicteric OROPHARYNX: MMM, no exudates, no oropharyngeal erythema or ulceration NECK: supple, no JVD LYMPH:  no palpable lymphadenopathy in the cervical, axillary or inguinal regions LUNGS: clear to auscultation b/l with normal respiratory effort HEART: regular rate & rhythm ABDOMEN:  normoactive bowel sounds , non tender, not distended. No palpable hepatosplenomegaly. Ileostomy in situ. Extremity: no pedal edema PSYCH: alert & oriented x 3 with fluent speech NEURO: no focal motor/sensory deficits   LABORATORY DATA:   . CBC Latest Ref Rng & Units 11/09/2018 09/28/2018 09/21/2018  WBC 4.0 - 10.5 K/uL 4.4 4.9 6.6  Hemoglobin 13.0 - 17.0 g/dL 12.0(L) 11.1(L) 10.5(L)  Hematocrit 39.0 - 52.0 % 37.6(L) 35.9(L) 33.8(L)  Platelets 150 - 400 K/uL 177 203 192    . CMP Latest Ref Rng & Units 10/13/2018 09/28/2018 09/21/2018  Glucose 70 - 99 mg/dL 75 91 79  BUN 8 - 23 mg/dL _0 Creatinine 0.61 - 1.24 mg/dL 0.85 0.77 0.75  Sodium 135 - 145 mmol/L 143 144 143  Potassium 3.5 - 5.1 mmol/L 4.3 4.5 3.9  Chloride 98 - 111 mmol/L 107 108 107  CO2 22 - 32 mmol/L _1 Calcium 8.9 - 10.3 mg/dL 10.1 9.9 9.4  Total Protein 6.5 - 8.1 g/dL - 7.4 7.0  Total Bilirubin 0.3 - 1.2 mg/dL - 0.3 0.2(L)  Alkaline Phos 38 - 126 U/L - 84 77  AST 15 -  41 U/L - 28 29  ALT 0 - 44 U/L - 34 33   . Lab Results  Component Value Date   LDH 139 09/28/2018     03/17/18 Cytogenetics:   03/17/18 Colon  Bx:   03/13/18 Peritoneum Biopsy:     RADIOGRAPHIC STUDIES:  I have personally reviewed the radiological images as listed and agreed with the findings in the report. No results found.  ASSESSMENT & PLAN:   63 y.o. male with  1. 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 No BM or CNS involvement.   04/18/18 PET/CT revealed Continued improvement in right colonic wall thickening and surrounding bulky masses consistent with treated lymphoma. No hypermetabolic activity to suggest residual tumor. There is some hypermetabolic activity associated with the sigmoid colon which demonstrates mild wall thickening, surrounding inflammation and underlying diverticulosis, suggesting mild diverticulitis. Suspected treatment related changes throughout the bone marrow. There is mildly increased activity within the clivus without clear corresponding finding on the CT images. Attention on follow-up recommended. Nonobstructing right renal calculi. 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   06/09/18 PET/CT revealedSoft tissue lesion within the ventral pelvis is again identified demonstrating mild to moderate increased uptake within SUV max of 5.61. Deauville criteria 4. Concerning for residual metabolically active tumor. 2. Similar appearance of diffusely thickened appendix within the right lower quadrant of the abdomen. Findings may reflect treatment related changes. 3. Increased radiotracer  uptake throughout the bone marrow which is favored to represent treatment related changes. 4. Lad coronary artery atherosclerotic calcifications.   patient completed planned chemorx on 10/21  10/05/18 CT A/P revealed Persistent wall thickening in the sigmoid colon although a component of this could be due to diverticulosis. There is also a suggestion of wall thickening and potential mucosal enhancement in the terminal ileum, as well as adjacent indistinct stranding in the mesenteric and omental adipose tissues roughly similar to the prior exam. Some of this may be treatment related. Inflammatory process such as Crohn's disease not excluded. 2. No current adenopathy. 3. Other imaging findings of potential clinical significance: Nonobstructive right nephrolithiasis. Aortic Atherosclerosis. Moderate prostatomegaly. Left foraminal impingement at L4-5.  2.H/oRLE DVT and b/l PEon anticoagulation.  3.h/oGI bleeding from lymphoma involving bowels on presentation-- resolved at this time -- will need to monitor with anticoagulation.  4. DM2 with some increased hyperglycemia with steroids Plan -monitor  -continue Oral hypoglycemics and carb consistent diet at this time.   5. Small bowel obstruction/ileus with sigmoid thickening/stenosis .  CT scan - sigmoid obstructive lesion ? Scar tissue from resolved lymphoma vs residual lymphoma vs post inflammatory scarring from diverticulitis/limited perforation. Also has SBO from adhesions. Colonoscopy 08/21/2018 - no intramucosal lesions. Extrinsic compression of sigmoid colon. 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 Greer Pickerel  PLAN: -Discussed pt labwork today, 11/09/18; HGB improved to 12.0, WBC normal at 4.0k, PLT normal at 177k. -Lasix as needed -Continue RT with Rad Onc -Continue with PT and continue to increase activity levels -Previously discussed the suspicious findings from the CT for a gastrointestinal inflammatory process ?  Previous  Crohns -Discussed that re-anastomosis considerations will be evaluated after completing RT -Recommended that the pt track his blood sugars with a glucometer at home, and then discuss his DM management further with his PCP, as the pt has lost weight -Continue Eliquis  -Feet elevation and graded sports compression sock use, 27-20s mmHg -Recommend consuming mixed nut powder and other magnesium rich foods -Did refer the pt to our nutritional therapist Ernestene Kiel - he has gained back his weight and is doing well. -Recommended that the pt continue to eat well, drink at least 48-64 oz of water each day, and walk 20-30 minutes each day.  -Will see the pt back in 4-6 weeks   RTC with Dr [name] with labs in 4 weeks   All of the patients questions were answered with apparent satisfaction. The patient knows to call the clinic with any problems, questions or concerns.  The total time spent in the appt was 20 minutes and more than 50% was on counseling and direct patient cares.    Sullivan Lone MD [place] AAHIVMS [facility] Hematology/Oncology Physician [facility]  (Office):       [phone] (Work cell):  [phone] (Fax):           [phone]  I, Baldwin Jamaica, am acting as a scribe for Dr. Sullivan Lone.   .I have reviewed the above documentation for accuracy and completeness, and I agree with the above. [name] MD

## 2018-11-09 ENCOUNTER — Ambulatory Visit: Payer: BLUE CROSS/BLUE SHIELD | Admitting: Physical Therapy

## 2018-11-09 ENCOUNTER — Encounter: Payer: Self-pay | Admitting: Physical Therapy

## 2018-11-09 ENCOUNTER — Ambulatory Visit
Admission: RE | Admit: 2018-11-09 | Discharge: 2018-11-09 | Disposition: A | Discharge status: Home / Self Care | Payer: BLUE CROSS/BLUE SHIELD | Source: Ambulatory Visit | Attending: Radiation Oncology | Admitting: Radiation Oncology

## 2018-11-09 ENCOUNTER — Other Ambulatory Visit: Payer: Self-pay

## 2018-11-09 ENCOUNTER — Inpatient Hospital Stay (HOSPITAL_BASED_OUTPATIENT_CLINIC_OR_DEPARTMENT_OTHER): Payer: BLUE CROSS/BLUE SHIELD | Admitting: Hematology

## 2018-11-09 ENCOUNTER — Inpatient Hospital Stay: Payer: BLUE CROSS/BLUE SHIELD

## 2018-11-09 ENCOUNTER — Telehealth: Payer: Self-pay | Admitting: Hematology

## 2018-11-09 VITALS — BP 135/85 | HR 61 | Temp 98.0°F | Resp 18 | Ht 69.0 in | Wt 225.8 lb

## 2018-11-09 DIAGNOSIS — R790 Abnormal level of blood mineral: Secondary | ICD-10-CM

## 2018-11-09 DIAGNOSIS — Z7901 Long term (current) use of anticoagulants: Secondary | ICD-10-CM | POA: Insufficient documentation

## 2018-11-09 DIAGNOSIS — Z86718 Personal history of other venous thrombosis and embolism: Secondary | ICD-10-CM

## 2018-11-09 DIAGNOSIS — C8378 Burkitt lymphoma, lymph nodes of multiple sites: Secondary | ICD-10-CM

## 2018-11-09 DIAGNOSIS — E440 Moderate protein-calorie malnutrition: Secondary | ICD-10-CM | POA: Insufficient documentation

## 2018-11-09 DIAGNOSIS — Z79899 Other long term (current) drug therapy: Secondary | ICD-10-CM

## 2018-11-09 DIAGNOSIS — Z510 Encounter for antineoplastic radiation therapy: Secondary | ICD-10-CM | POA: Diagnosis not present

## 2018-11-09 DIAGNOSIS — R293 Abnormal posture: Secondary | ICD-10-CM

## 2018-11-09 DIAGNOSIS — C8510 Unspecified B-cell lymphoma, unspecified site: Secondary | ICD-10-CM

## 2018-11-09 DIAGNOSIS — Z95828 Presence of other vascular implants and grafts: Secondary | ICD-10-CM

## 2018-11-09 DIAGNOSIS — M6281 Muscle weakness (generalized): Secondary | ICD-10-CM

## 2018-11-09 DIAGNOSIS — Z7189 Other specified counseling: Secondary | ICD-10-CM

## 2018-11-09 LAB — CBC WITH DIFFERENTIAL/PLATELET
Abs Immature Granulocytes: 0 10*3/uL (ref 0.00–0.07)
Basophils Absolute: 0 10*3/uL (ref 0.0–0.1)
Basophils Relative: 1 %
EOS ABS: 0 10*3/uL (ref 0.0–0.5)
EOS PCT: 1 %
HCT: 37.6 % — ABNORMAL LOW (ref 39.0–52.0)
Hemoglobin: 12 g/dL — ABNORMAL LOW (ref 13.0–17.0)
Immature Granulocytes: 0 %
Lymphocytes Relative: 37 %
Lymphs Abs: 1.6 10*3/uL (ref 0.7–4.0)
MCH: 32.3 pg (ref 26.0–34.0)
MCHC: 31.9 g/dL (ref 30.0–36.0)
MCV: 101.3 fL — ABNORMAL HIGH (ref 80.0–100.0)
MONO ABS: 0.4 10*3/uL (ref 0.1–1.0)
Monocytes Relative: 10 %
Neutro Abs: 2.3 10*3/uL (ref 1.7–7.7)
Neutrophils Relative %: 51 %
Platelets: 177 10*3/uL (ref 150–400)
RBC: 3.71 MIL/uL — ABNORMAL LOW (ref 4.22–5.81)
RDW: 15.1 % (ref 11.5–15.5)
WBC: 4.4 10*3/uL (ref 4.0–10.5)
nRBC: 0 % (ref 0.0–0.2)

## 2018-11-09 LAB — CMP (CANCER CENTER ONLY)
ALT: 38 U/L (ref 0–44)
AST: 32 U/L (ref 15–41)
Albumin: 4.1 g/dL (ref 3.5–5.0)
Alkaline Phosphatase: 68 U/L (ref 38–126)
Anion gap: 10 (ref 5–15)
BUN: 9 mg/dL (ref 8–23)
CO2: 24 mmol/L (ref 22–32)
CREATININE: 0.79 mg/dL (ref 0.61–1.24)
Calcium: 9.5 mg/dL (ref 8.9–10.3)
Chloride: 107 mmol/L (ref 98–111)
GFR, Est AFR Am: >60 mL/min (ref >60)
Glucose, Bld: 91 mg/dL (ref 70–99)
Potassium: 4 mmol/L (ref 3.5–5.1)
Sodium: 141 mmol/L (ref 135–145)
Total Bilirubin: 0.3 mg/dL (ref 0.3–1.2)
Total Protein: 6.8 g/dL (ref 6.5–8.1)

## 2018-11-09 LAB — MAGNESIUM: Magnesium: 1.5 mg/dL — ABNORMAL LOW (ref 1.7–2.4)

## 2018-11-09 MED ORDER — SODIUM CHLORIDE 0.9% FLUSH
10.0000 mL | Freq: Once | INTRAVENOUS | Status: AC
  Administered 2018-11-09: 10 mL
  Filled 2018-11-09: qty 10

## 2018-11-09 MED ORDER — HEPARIN SOD (PORK) LOCK FLUSH 100 UNIT/ML IV SOLN
500.0000 [IU] | Freq: Once | INTRAVENOUS | Status: AC
  Administered 2018-11-09: 500 [IU]
  Filled 2018-11-09: qty 5

## 2018-11-09 NOTE — Therapy (Signed)
[place], [place], [address] Phone: [phone]   Fax:  [phone]  Physical Therapy Treatment  Patient Details  Name: Mark Frey MRN: [id number] Date of Birth: Apr 16, 1956 Referring Provider (PT): [name]    Encounter Date: 11/09/2018  PT End of Session - 11/09/18 1652    Visit Number  13    Number of Visits  17    Date for PT Re-Evaluation  11/28/18    PT Start Time  0051    PT Stop Time  1603    PT Time Calculation (min)  46 min    Activity Tolerance  Patient tolerated treatment well    Behavior During Therapy  [facility] for tasks assessed/performed       Past Medical History:  Diagnosis Date  . ALLERGIC RHINITIS   . Cancer ([place])    Lymphoma   . Diabetes mellitus   . Hyperlipidemia     Past Surgical History:  Procedure Laterality Date  . BIOPSY  03/15/2018   Procedure: BIOPSY;  Surgeon: Milus Banister, MD;  Location: [name];  Service: Endoscopy;;  . BOWEL RESECTION N/A 08/25/2018   Procedure: LAPROSCOPIC LOOP ILEOSTOMY;  Surgeon: Greer Pickerel, MD;  Location: [facility];  Service: General;  Laterality: N/A;  . CLEFT PALATE REPAIR    . COLONOSCOPY N/A 03/15/2018   Procedure: COLONOSCOPY;  Surgeon: Milus Banister, MD;  Location: [facility];  Service: Endoscopy;  Laterality: N/A;  . COLONOSCOPY N/A 08/20/2018   Procedure: COLONOSCOPY;  Surgeon: Irene Shipper, MD;  Location: [facility];  Service: Endoscopy;  Laterality: N/A;  . deviated septum repair     slight improvement  . ESOPHAGOGASTRODUODENOSCOPY N/A 03/15/2018   Procedure: ESOPHAGOGASTRODUODENOSCOPY (EGD);  Surgeon: Milus Banister, MD;  Location: [name];  Service: Endoscopy;  Laterality: N/A;  . IR IMAGING GUIDED PORT INSERTION  03/17/2018  . LAPAROSCOPY N/A 08/25/2018   Procedure: LAPAROSCOPY DIAGNOSTIC;  Surgeon: Greer Pickerel, MD;  Location: [facility];  Service: General;  Laterality: N/A;  . TONSILLECTOMY      There were  no vitals filed for this visit.  Subjective Assessment - 11/09/18 1518    Subjective  I think today is supposed to be my last day.     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  He has history of RLE and bilateral PE  He has 6 rounds of chemo and after that he had a bowel blockage that resulted in a temporarry colostomy 08/25/2018.  He is doing much better since that .  He has lost about 60 pounds  Past history included DM, past history of back pain that was resolved with chiropractics     Patient Stated Goals  to get stronger He wants to be able to squat and get up     Currently in Pain?  No/denies    Pain Score  0-No pain                       [facility] Adult PT Treatment/Exercise - 11/09/18 0001      Knee/Hip Exercises: Aerobic   Elliptical  Incline 5, level 1 x7 mins    Tread Mill  2.0 mph x6 mins; 2.0 mph x 10 mins at end of session. Discussed pts progress thus far and joining [employer] possibly this week.       Knee/Hip Exercises: Machines for Strengthening   Cybex Knee Extension  25#, x25  pt able to set machine up independently   Cybex Knee Flexion  35#, x25   VCs for full ROM   Cybex Leg Press  45# , x50   pt did not require any cueing with this     Shoulder Exercises: ROM/Strengthening   Lat Pull  20 reps   35#   Cybex Row  20 reps   35#              PT Short Term Goals - 11/09/18 1519      PT SHORT TERM GOAL #1   Title  Pt will report he can do a beginning home exercise program using heart rate reserve or rate of perceived exertion as an intensity guide     Baseline  11/09/18- pt reports he can do this    Time  4    Period  Weeks    Status  Achieved      PT SHORT TERM GOAL #2   Title  Pt will increase sit to stand in 30 seconds to 11 indicating improvment in general strength     Baseline  11/09/18- 12 reps    Time  4    Period  Weeks    Status  Achieved        PT Long Term Goals -  11/09/18 1521      PT LONG TERM GOAL #1   Title  Pt will increase sit to stand in 30 seconds to 14 indicating improvment in general strength     Baseline  11/09/18- 12 reps    Time  8    Period  Weeks    Status  Partially Met      PT LONG TERM GOAL #2   Title  Pt will increase averge grip strength in each hand to 70 pounds indicating an improvement in general strength     Baseline  Rt 74#, Lt 70# - 10/27/19, 11/09/18- R 70, L 82#    Time  8    Period  Weeks    Status  Achieved      PT LONG TERM GOAL #3   Title  Pt will decrease TUG to < 8 seconds indicating an improvment in functional balance and mobility     Baseline  7.10 sec - 10/26/18    Time  8    Period  Weeks    Status  Achieved      PT LONG TERM GOAL #4   Title  Pt will be able to get down to the floor and get back up so he can get in position to do some job duties     Baseline  Pt reports able to do this with little difficulty, just feels weak after-10/26/18    Time  8    Period  Weeks    Status  Achieved      PT LONG TERM GOAL #5   Title  Pt will have a plan to continue with community exercise on his own at home     Baseline  Pt plans to join [employer] and continue exercising-10/26/18, 11/09/18- pt plans on joining [employer]    Time  8    Period  Weeks    Status  Achieved            Plan - 11/09/18 1653    Clinical Impression Statement  Pt has now met goals for therapy and is ready to be discharged from skilled PT services. His strength and mobility has  improved greatly since his evaluation. He is now able to use gym equipment independently and will transition to going to [employer].     Rehab Potential  Excellent    Clinical Impairments Affecting Rehab Potential  colostomy     PT Frequency  2x / week    PT Duration  8 weeks    PT Treatment/Interventions  ADLs/Self Care Home Management;Gait training;Stair training;Functional mobility training;Balance training;Therapeutic exercise;Therapeutic  activities;Patient/family education;Orthotic Fit/Training;Manual techniques;Taping    PT Next Visit Plan  d/c this visit    Consulted and Agree with Plan of Care  Patient       Patient will benefit from skilled therapeutic intervention in order to improve the following deficits and impairments:  Postural dysfunction, Decreased mobility, Decreased activity tolerance, Decreased endurance, Decreased strength, Impaired perceived functional ability, Increased edema  Visit Diagnosis: Muscle weakness (generalized)  Abnormal posture     Problem List Patient Active Problem List   Diagnosis Date Noted  . Abnormal CT of the abdomen   . Protein-calorie malnutrition, severe 08/19/2018  . 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 Diverticulitis of colon   . Low magnesium level 07/30/2018  . Hypokalemia 07/21/2018  . Bowel obstruction in setting of lymphoma and sigmoid stricture 07/21/2018  . Diarrhea 07/21/2018  . Burkitt lymphoma, lymph nodes of multiple sites ([place]) 05/22/2018  . Diabetes mellitus ([place])   . Edema   . Deep vein thrombosis (DVT) of femoral vein of right lower extremity ([place])   . Counseling regarding advance care planning and goals of care 04/25/2018  . Gastrointestinal hemorrhage   . Encounter for antineoplastic chemotherapy   . 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[place]) 04/10/2018  . Port-A-Cath in place 03/27/2018  . Acute deep vein thrombosis (DVT) of femoral vein of right lower extremity ([place])   . 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[place]) 03/15/2018  . Bilateral pulmonary embolism ([place]) 03/10/2018  . Anemia 03/10/2018  . Hypoglycemia 03/10/2018  . Occult blood in stools 03/10/2018  . Abdominal mass 03/10/2018  . Tachycardia 01/31/2017  . Controlled diabetes mellitus type II without complication ([place]) 70/96/4383  . Hypertension 06/05/2014  . Irritable bowel syndrome 03/30/2010  . Morbid obesity ([place]) 12/29/2009  . Dysmetabolic syndrome X 81/84/0375  .  BACK PAIN WITH RADICULOPATHY 06/09/2007  . Hyperlipidemia 05/12/2007  . ALLERGIC RHINITIS 05/12/2007    Allyson Sabal [facility] 11/09/2018, 4:55 PM  [place], [place], [address] Phone: [phone]   Fax:  [phone]  Name: Mark Frey MRN: [id number] Date of Birth: [birth year]  PHYSICAL THERAPY DISCHARGE SUMMARY  Visits from Start of Care: 13  Current functional level related to goals / functional outcomes: Pt has met all goals and partially met one goal and is ready to transition to [employer]   Remaining deficits: none   Education / Equipment: HEP  Plan: Patient agrees to discharge.  Patient goals were met. Patient is being discharged due to meeting the stated rehab goals.  ?????    Allyson Sabal [place], [place] 11/09/18 4:55 PM

## 2018-11-09 NOTE — Therapy (Deleted)
[place], [place], [address] Phone: [phone]   Fax:  [phone]  November 09, 2018   _0 @  Physical Therapy Discharge Summary  Patient: Mark Frey  MRN: [id number]  Date of Birth: Jan 06, 1956   Diagnosis: Muscle weakness (generalized)  Abnormal posture Referring Provider (PT): [name]    The above patient had been seen in Physical Therapy *** times of *** treatments scheduled with *** no shows and *** cancellations.  The treatment consisted of *** The patient is: {improved/worse/unchanged:3041574}  Subjective: ***  Discharge Findings: ***  Functional Status at Discharge: ***  {JLLVD:[id number]}  Plan - 11/09/18 1653    Clinical Impression Statement  Pt has now met goals for therapy and is ready to be discharged from skilled PT services. His strength and mobility has improved greatly since his evaluation. He is now able to use gym equipment independently and will transition to going to [employer].     Rehab Potential  Excellent    Clinical Impairments Affecting Rehab Potential  colostomy     PT Frequency  2x / week    PT Duration  8 weeks    PT Treatment/Interventions  ADLs/Self Care Home Management;Gait training;Stair training;Functional mobility training;Balance training;Therapeutic exercise;Therapeutic activities;Patient/family education;Orthotic Fit/Training;Manual techniques;Taping    PT Next Visit Plan  d/c this visit    Consulted and Agree with Plan of Care  Patient       Sincerely,   Manus Gunning, PT   CC _1 @  [place], [place], [address] Phone: [phone]   Fax:  [phone]  Patient: Mark Frey  MRN: [id number]  Date of Birth: [birth year]

## 2018-11-09 NOTE — Telephone Encounter (Signed)
Scheduled appt per 02/27 los.

## 2018-11-09 NOTE — Progress Notes (Signed)
Pt here for patient teaching.  Pt given Radiation and You booklet.  Reviewed areas of pertinence such as diarrhea, fatigue, nausea and vomiting and skin changes . Pt able to give teach back of to pat skin, use unscented/gentle soap, have Imodium on hand and drink plenty of water, Pt verbalizes understanding of information given and will contact nursing with any questions or concerns.     Http://[email or web address]/treatmentinformation/whattoexpect/index

## 2018-11-10 ENCOUNTER — Ambulatory Visit
Admission: RE | Admit: 2018-11-10 | Discharge: 2018-11-10 | Disposition: A | Discharge status: Home / Self Care | Payer: BLUE CROSS/BLUE SHIELD | Source: Ambulatory Visit | Attending: Radiation Oncology | Admitting: Radiation Oncology

## 2018-11-10 DIAGNOSIS — C8378 Burkitt lymphoma, lymph nodes of multiple sites: Secondary | ICD-10-CM | POA: Diagnosis not present

## 2018-11-10 DIAGNOSIS — Z510 Encounter for antineoplastic radiation therapy: Secondary | ICD-10-CM | POA: Diagnosis not present

## 2018-11-13 ENCOUNTER — Ambulatory Visit
Admission: RE | Admit: 2018-11-13 | Discharge: 2018-11-13 | Disposition: A | Discharge status: Home / Self Care | Payer: BLUE CROSS/BLUE SHIELD | Source: Ambulatory Visit | Attending: Radiation Oncology | Admitting: Radiation Oncology

## 2018-11-13 DIAGNOSIS — C8378 Burkitt lymphoma, lymph nodes of multiple sites: Secondary | ICD-10-CM | POA: Insufficient documentation

## 2018-11-13 DIAGNOSIS — Z510 Encounter for antineoplastic radiation therapy: Secondary | ICD-10-CM | POA: Insufficient documentation

## 2018-11-14 ENCOUNTER — Ambulatory Visit
Admission: RE | Admit: 2018-11-14 | Discharge: 2018-11-14 | Disposition: A | Discharge status: Home / Self Care | Payer: BLUE CROSS/BLUE SHIELD | Source: Ambulatory Visit | Attending: Radiation Oncology | Admitting: Radiation Oncology

## 2018-11-14 DIAGNOSIS — Z510 Encounter for antineoplastic radiation therapy: Secondary | ICD-10-CM | POA: Diagnosis not present

## 2018-11-14 DIAGNOSIS — C8378 Burkitt lymphoma, lymph nodes of multiple sites: Secondary | ICD-10-CM | POA: Diagnosis not present

## 2018-11-15 ENCOUNTER — Ambulatory Visit
Admission: RE | Admit: 2018-11-15 | Discharge: 2018-11-15 | Disposition: A | Discharge status: Home / Self Care | Payer: BLUE CROSS/BLUE SHIELD | Source: Ambulatory Visit | Attending: Radiation Oncology | Admitting: Radiation Oncology

## 2018-11-15 DIAGNOSIS — Z510 Encounter for antineoplastic radiation therapy: Secondary | ICD-10-CM | POA: Diagnosis not present

## 2018-11-15 DIAGNOSIS — C8378 Burkitt lymphoma, lymph nodes of multiple sites: Secondary | ICD-10-CM | POA: Diagnosis not present

## 2018-11-16 ENCOUNTER — Ambulatory Visit
Admission: RE | Admit: 2018-11-16 | Discharge: 2018-11-16 | Disposition: A | Discharge status: Home / Self Care | Payer: BLUE CROSS/BLUE SHIELD | Source: Ambulatory Visit | Attending: Radiation Oncology | Admitting: Radiation Oncology

## 2018-11-16 DIAGNOSIS — Z510 Encounter for antineoplastic radiation therapy: Secondary | ICD-10-CM | POA: Diagnosis not present

## 2018-11-16 DIAGNOSIS — C8378 Burkitt lymphoma, lymph nodes of multiple sites: Secondary | ICD-10-CM | POA: Diagnosis not present

## 2018-11-17 ENCOUNTER — Ambulatory Visit
Admission: RE | Admit: 2018-11-17 | Discharge: 2018-11-17 | Disposition: A | Discharge status: Home / Self Care | Payer: BLUE CROSS/BLUE SHIELD | Source: Ambulatory Visit | Attending: Radiation Oncology | Admitting: Radiation Oncology

## 2018-11-17 DIAGNOSIS — C8378 Burkitt lymphoma, lymph nodes of multiple sites: Secondary | ICD-10-CM | POA: Diagnosis not present

## 2018-11-17 DIAGNOSIS — Z510 Encounter for antineoplastic radiation therapy: Secondary | ICD-10-CM | POA: Diagnosis not present

## 2018-11-20 ENCOUNTER — Ambulatory Visit
Admission: RE | Admit: 2018-11-20 | Discharge: 2018-11-20 | Disposition: A | Discharge status: Home / Self Care | Payer: BLUE CROSS/BLUE SHIELD | Source: Ambulatory Visit | Attending: Radiation Oncology | Admitting: Radiation Oncology

## 2018-11-20 DIAGNOSIS — Z510 Encounter for antineoplastic radiation therapy: Secondary | ICD-10-CM | POA: Diagnosis not present

## 2018-11-20 DIAGNOSIS — C8378 Burkitt lymphoma, lymph nodes of multiple sites: Secondary | ICD-10-CM | POA: Diagnosis not present

## 2018-11-21 ENCOUNTER — Ambulatory Visit
Admission: RE | Admit: 2018-11-21 | Discharge: 2018-11-21 | Disposition: A | Discharge status: Home / Self Care | Payer: BLUE CROSS/BLUE SHIELD | Source: Ambulatory Visit | Attending: Radiation Oncology | Admitting: Radiation Oncology

## 2018-11-21 DIAGNOSIS — Z510 Encounter for antineoplastic radiation therapy: Secondary | ICD-10-CM | POA: Diagnosis not present

## 2018-11-21 DIAGNOSIS — C8378 Burkitt lymphoma, lymph nodes of multiple sites: Secondary | ICD-10-CM | POA: Diagnosis not present

## 2018-11-22 ENCOUNTER — Other Ambulatory Visit: Payer: Self-pay

## 2018-11-22 ENCOUNTER — Ambulatory Visit
Admission: RE | Admit: 2018-11-22 | Discharge: 2018-11-22 | Disposition: A | Discharge status: Home / Self Care | Payer: BLUE CROSS/BLUE SHIELD | Source: Ambulatory Visit | Attending: Radiation Oncology | Admitting: Radiation Oncology

## 2018-11-22 DIAGNOSIS — Z510 Encounter for antineoplastic radiation therapy: Secondary | ICD-10-CM | POA: Diagnosis not present

## 2018-11-22 DIAGNOSIS — C8378 Burkitt lymphoma, lymph nodes of multiple sites: Secondary | ICD-10-CM | POA: Diagnosis not present

## 2018-11-23 ENCOUNTER — Ambulatory Visit
Admission: RE | Admit: 2018-11-23 | Discharge: 2018-11-23 | Disposition: A | Discharge status: Home / Self Care | Payer: BLUE CROSS/BLUE SHIELD | Source: Ambulatory Visit | Attending: Radiation Oncology | Admitting: Radiation Oncology

## 2018-11-23 DIAGNOSIS — C8378 Burkitt lymphoma, lymph nodes of multiple sites: Secondary | ICD-10-CM | POA: Diagnosis not present

## 2018-11-23 DIAGNOSIS — Z510 Encounter for antineoplastic radiation therapy: Secondary | ICD-10-CM | POA: Diagnosis not present

## 2018-11-24 ENCOUNTER — Other Ambulatory Visit: Payer: Self-pay

## 2018-11-24 ENCOUNTER — Ambulatory Visit
Admission: RE | Admit: 2018-11-24 | Discharge: 2018-11-24 | Disposition: A | Discharge status: Home / Self Care | Payer: BLUE CROSS/BLUE SHIELD | Source: Ambulatory Visit | Attending: Radiation Oncology | Admitting: Radiation Oncology

## 2018-11-24 DIAGNOSIS — C8378 Burkitt lymphoma, lymph nodes of multiple sites: Secondary | ICD-10-CM | POA: Diagnosis not present

## 2018-11-24 DIAGNOSIS — Z510 Encounter for antineoplastic radiation therapy: Secondary | ICD-10-CM | POA: Diagnosis not present

## 2018-11-27 ENCOUNTER — Ambulatory Visit
Admission: RE | Admit: 2018-11-27 | Discharge: 2018-11-27 | Disposition: A | Discharge status: Home / Self Care | Payer: BLUE CROSS/BLUE SHIELD | Source: Ambulatory Visit | Attending: Radiation Oncology | Admitting: Radiation Oncology

## 2018-11-27 ENCOUNTER — Other Ambulatory Visit: Payer: Self-pay

## 2018-11-27 DIAGNOSIS — C8378 Burkitt lymphoma, lymph nodes of multiple sites: Secondary | ICD-10-CM | POA: Diagnosis not present

## 2018-11-27 DIAGNOSIS — Z510 Encounter for antineoplastic radiation therapy: Secondary | ICD-10-CM | POA: Diagnosis not present

## 2018-11-28 ENCOUNTER — Other Ambulatory Visit: Payer: Self-pay

## 2018-11-28 ENCOUNTER — Ambulatory Visit
Admission: RE | Admit: 2018-11-28 | Discharge: 2018-11-28 | Disposition: A | Discharge status: Home / Self Care | Payer: BLUE CROSS/BLUE SHIELD | Source: Ambulatory Visit | Attending: Radiation Oncology | Admitting: Radiation Oncology

## 2018-11-28 DIAGNOSIS — Z510 Encounter for antineoplastic radiation therapy: Secondary | ICD-10-CM | POA: Diagnosis not present

## 2018-11-28 DIAGNOSIS — C8378 Burkitt lymphoma, lymph nodes of multiple sites: Secondary | ICD-10-CM | POA: Diagnosis not present

## 2018-11-29 ENCOUNTER — Ambulatory Visit
Admission: RE | Admit: 2018-11-29 | Discharge: 2018-11-29 | Disposition: A | Discharge status: Home / Self Care | Payer: BLUE CROSS/BLUE SHIELD | Source: Ambulatory Visit | Attending: Radiation Oncology | Admitting: Radiation Oncology

## 2018-11-29 ENCOUNTER — Other Ambulatory Visit: Payer: Self-pay

## 2018-11-29 DIAGNOSIS — C8378 Burkitt lymphoma, lymph nodes of multiple sites: Secondary | ICD-10-CM | POA: Diagnosis not present

## 2018-11-29 DIAGNOSIS — Z510 Encounter for antineoplastic radiation therapy: Secondary | ICD-10-CM | POA: Diagnosis not present

## 2018-11-30 ENCOUNTER — Other Ambulatory Visit: Payer: Self-pay

## 2018-11-30 ENCOUNTER — Ambulatory Visit
Admission: RE | Admit: 2018-11-30 | Discharge: 2018-11-30 | Disposition: A | Discharge status: Home / Self Care | Payer: BLUE CROSS/BLUE SHIELD | Source: Ambulatory Visit | Attending: Radiation Oncology | Admitting: Radiation Oncology

## 2018-11-30 DIAGNOSIS — C8378 Burkitt lymphoma, lymph nodes of multiple sites: Secondary | ICD-10-CM | POA: Diagnosis not present

## 2018-11-30 DIAGNOSIS — Z510 Encounter for antineoplastic radiation therapy: Secondary | ICD-10-CM | POA: Diagnosis not present

## 2018-12-01 ENCOUNTER — Ambulatory Visit
Admission: RE | Admit: 2018-12-01 | Discharge: 2018-12-01 | Disposition: A | Discharge status: Home / Self Care | Payer: BLUE CROSS/BLUE SHIELD | Source: Ambulatory Visit | Attending: Radiation Oncology | Admitting: Radiation Oncology

## 2018-12-01 ENCOUNTER — Other Ambulatory Visit: Payer: Self-pay

## 2018-12-01 DIAGNOSIS — Z510 Encounter for antineoplastic radiation therapy: Secondary | ICD-10-CM | POA: Diagnosis not present

## 2018-12-01 DIAGNOSIS — C8378 Burkitt lymphoma, lymph nodes of multiple sites: Secondary | ICD-10-CM | POA: Diagnosis not present

## 2018-12-03 ENCOUNTER — Other Ambulatory Visit: Payer: Self-pay | Admitting: Hematology

## 2018-12-04 ENCOUNTER — Other Ambulatory Visit: Payer: Self-pay

## 2018-12-04 ENCOUNTER — Ambulatory Visit
Admission: RE | Admit: 2018-12-04 | Discharge: 2018-12-04 | Disposition: A | Discharge status: Home / Self Care | Payer: BLUE CROSS/BLUE SHIELD | Source: Ambulatory Visit | Attending: Radiation Oncology | Admitting: Radiation Oncology

## 2018-12-04 DIAGNOSIS — Z510 Encounter for antineoplastic radiation therapy: Secondary | ICD-10-CM | POA: Diagnosis not present

## 2018-12-04 DIAGNOSIS — C8378 Burkitt lymphoma, lymph nodes of multiple sites: Secondary | ICD-10-CM | POA: Diagnosis not present

## 2018-12-05 ENCOUNTER — Telehealth: Payer: Self-pay | Admitting: *Deleted

## 2018-12-05 ENCOUNTER — Other Ambulatory Visit: Payer: Self-pay

## 2018-12-05 ENCOUNTER — Ambulatory Visit
Admission: RE | Admit: 2018-12-05 | Discharge: 2018-12-05 | Disposition: A | Discharge status: Home / Self Care | Payer: BLUE CROSS/BLUE SHIELD | Source: Ambulatory Visit | Attending: Radiation Oncology | Admitting: Radiation Oncology

## 2018-12-05 DIAGNOSIS — C8378 Burkitt lymphoma, lymph nodes of multiple sites: Secondary | ICD-10-CM | POA: Diagnosis not present

## 2018-12-05 DIAGNOSIS — Z510 Encounter for antineoplastic radiation therapy: Secondary | ICD-10-CM | POA: Diagnosis not present

## 2018-12-05 NOTE — Telephone Encounter (Signed)
Wife called back, patient will have lab/port flush on 3/25 and phone appt with MD on 3/26. Scheduling msg sent.

## 2018-12-05 NOTE — Telephone Encounter (Signed)
Patient/wife LVM asking if labs/port flush/MD appt scheduled for 3/26 can be changed so that patient can have labs and port flush on 3/25 (last day of radiation) and speak with [name] by phone on 3/26? [name] states this is ok. LVM for patient/wife information them that this is ok and asked for them to call office back to determine if lab/port flush appt is better before or after radiation.

## 2018-12-06 ENCOUNTER — Inpatient Hospital Stay: Payer: BLUE CROSS/BLUE SHIELD

## 2018-12-06 ENCOUNTER — Ambulatory Visit
Admission: RE | Admit: 2018-12-06 | Discharge: 2018-12-06 | Disposition: A | Discharge status: Home / Self Care | Payer: BLUE CROSS/BLUE SHIELD | Source: Ambulatory Visit | Attending: Radiation Oncology | Admitting: Radiation Oncology

## 2018-12-06 ENCOUNTER — Other Ambulatory Visit: Payer: Self-pay

## 2018-12-06 DIAGNOSIS — Z86718 Personal history of other venous thrombosis and embolism: Secondary | ICD-10-CM | POA: Insufficient documentation

## 2018-12-06 DIAGNOSIS — Z79899 Other long term (current) drug therapy: Secondary | ICD-10-CM

## 2018-12-06 DIAGNOSIS — C8378 Burkitt lymphoma, lymph nodes of multiple sites: Secondary | ICD-10-CM

## 2018-12-06 DIAGNOSIS — Z510 Encounter for antineoplastic radiation therapy: Secondary | ICD-10-CM | POA: Diagnosis not present

## 2018-12-06 DIAGNOSIS — E440 Moderate protein-calorie malnutrition: Secondary | ICD-10-CM

## 2018-12-06 DIAGNOSIS — Z7901 Long term (current) use of anticoagulants: Secondary | ICD-10-CM

## 2018-12-06 LAB — CBC WITH DIFFERENTIAL/PLATELET
Abs Immature Granulocytes: 0.01 10*3/uL (ref 0.00–0.07)
BASOS ABS: 0 10*3/uL (ref 0.0–0.1)
Basophils Relative: 1 %
Eosinophils Absolute: 0 10*3/uL (ref 0.0–0.5)
Eosinophils Relative: 1 %
HCT: 40.3 % (ref 39.0–52.0)
Hemoglobin: 13.2 g/dL (ref 13.0–17.0)
Immature Granulocytes: 0 %
Lymphocytes Relative: 14 %
Lymphs Abs: 0.5 10*3/uL — ABNORMAL LOW (ref 0.7–4.0)
MCH: 32.3 pg (ref 26.0–34.0)
MCHC: 32.8 g/dL (ref 30.0–36.0)
MCV: 98.5 fL (ref 80.0–100.0)
Monocytes Absolute: 0.7 10*3/uL (ref 0.1–1.0)
Monocytes Relative: 19 %
NEUTROS PCT: 65 %
Neutro Abs: 2.2 10*3/uL (ref 1.7–7.7)
Platelets: 110 10*3/uL — ABNORMAL LOW (ref 150–400)
RBC: 4.09 MIL/uL — ABNORMAL LOW (ref 4.22–5.81)
RDW: 14.7 % (ref 11.5–15.5)
WBC: 3.4 10*3/uL — ABNORMAL LOW (ref 4.0–10.5)
nRBC: 0 % (ref 0.0–0.2)

## 2018-12-06 LAB — CMP (CANCER CENTER ONLY)
ALT: 60 U/L — ABNORMAL HIGH (ref 0–44)
ANION GAP: 11 (ref 5–15)
AST: 29 U/L (ref 15–41)
Albumin: 4.1 g/dL (ref 3.5–5.0)
Alkaline Phosphatase: 91 U/L (ref 38–126)
BUN: 11 mg/dL (ref 8–23)
CO2: 27 mmol/L (ref 22–32)
Calcium: 10.1 mg/dL (ref 8.9–10.3)
Chloride: 102 mmol/L (ref 98–111)
Creatinine: 0.88 mg/dL (ref 0.61–1.24)
GFR, Est AFR Am: >60 mL/min (ref >60)
GFR, Estimated: >60 mL/min (ref >60)
Glucose, Bld: 113 mg/dL — ABNORMAL HIGH (ref 70–99)
Potassium: 4.4 mmol/L (ref 3.5–5.1)
Sodium: 140 mmol/L (ref 135–145)
Total Bilirubin: 0.7 mg/dL (ref 0.3–1.2)
Total Protein: 7.8 g/dL (ref 6.5–8.1)

## 2018-12-06 LAB — MAGNESIUM: Magnesium: 1.6 mg/dL — ABNORMAL LOW (ref 1.7–2.4)

## 2018-12-06 NOTE — Progress Notes (Signed)
HEMATOLOGY/ONCOLOGY CLINIC NOTE  Date of Service: 12/07/18    Patient Care Team: [name], MD as PCP - General (Family Medicine)  CHIEF COMPLAINTS/PURPOSE OF CONSULTATION:  F/u for continued Mx of Burkitts lymphoma  HISTORY OF PRESENTING ILLNESS:   Mark Frey is a wonderful 63 y.o. male who has been referred to [place] by my colleague Dr. 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 He is accompanied today by his wife. The pt reports that he is doing well overall.   The pt started R-EPOCH every 3 weeks with neulasta support on 03/17/18. He presented to the ED with right leg swelling, found to be a DVT and bilateral PE, which resulted in the incidental finding of a lower abdomen tumor measuring 18.7 x 18.5 x 17.8 cm. He denies any abdominal pains or changes in his bowel habits prior to this finding. He first noted blood in the stools 4-5 days prior to appearing to the ED.  The pt reports that he has been taking 130m Lovenox injections once each day, except for the days in which he had blood in the stools. He hasn't had blood in his stools for the last 7 days.   He has had GI bleed related to bowel involvement with his lymphoma - this is now resolving and his hgb is stabilizing.  He notes that his scrotum and leg swelling has decreased. He denies having CP or SOB at any point from his PE.   Most recent lab results (04/06/18) of CBC w/diff, CMP, Reticulocytes  is as follows: all values are WNL except for WBC at 15.3k, RBC at 3.65, HGB at 10.5, HCT at 31.4, RDW at 17.8, PLT at 640k, ANC at 12.7k, Monocytes abs at 1.6k, Glucose at 162, Total Protein at 6.2, Albumin at 3.1, Total bilirubin at <0.2, Retic ct pct at 2.5%. Uric acid 04/06/18 was low at 3.0 LDH 04/06/18 elevated at 251  On review of systems, pt reports remaining right leg swelling, blood in the stools 7 days ago, two  bowel movements each day, left leg swelling, improved scrotal swelling, eating well, and denies problems with his port, abdominal pains, constipation, diarrhea, jaw pain, pain along the spine, problems passing urine, and any other symptoms.  Interval History:   Mark KAUFFMANis visited today via a VShip brokerfor management and evaluation of his Burkitt's lymphoma. The patient[place] on [place]. He is accompanied today by his wife on the phone. The pt reports that he is doing well overall.  In the interim, the pt completed IMRT with Dr SIsidore Moosyesterday, 12/06/18, receiving 36 Gy in 20 fractions.  The pt reports that he has developed liquid diarrhea, but notes "he's dealing with it." The pt notes that he has had fairly large volumes of stools in his ileostomy, noting that his bags last 3-4 hours. He notes that he has been in contact with the ostomy nurse. The pt notes that his appetite has also been lower for the last week. The pt notes that he is eating about 50% as much food as his baseline. He notes that he has lost 12-14 pounds in the last month. The pt notes that yesterday his appetite began to turn for the better and believes he is now moving in the right direction.  The pt notes he "has no issues with his strength," and endorses fair  energy levels.  The pt notes that he has continued to take 569m PO Magnesium each day.  Lab results (12/06/18) of CBC w/diff and CMP is as follows: all values are WNL except for WBC at 3.4k, RBC at 4.09, PLT at 110k, Lymphs abs at 500, Glucose at 113, ALT at 60. 12/06/18 Magnesium at 1.6  On review of systems, pt reports liquid diarrhea in ostomy, frequent stools, low appetite, eating less, weight loss, stable energy levels, and denies any other symptoms.  MEDICAL HISTORY:  Past Medical History:  Diagnosis Date   ALLERGIC RHINITIS    [place]    Lymphoma    Diabetes mellitus    Hyperlipidemia     SURGICAL HISTORY: Past  Surgical History:  Procedure Laterality Date   BIOPSY  03/15/2018   Procedure: BIOPSY;  Surgeon: JMilus Banister MD;  Location: [facility];  Service: Endoscopy;;   BOWEL RESECTION N/A 08/25/2018   Procedure: LAPROSCOPIC LOOP ILEOSTOMY;  Surgeon: WGreer Pickerel MD;  [facility];  Service: General;  Laterality: N/A;   CLEFT PALATE REPAIR     COLONOSCOPY N/A 03/15/2018   Procedure: COLONOSCOPY;  Surgeon: JMilus Banister MD;  [facility];  Service: Endoscopy;  Laterality: N/A;   COLONOSCOPY N/A 08/20/2018   Procedure: COLONOSCOPY;  Surgeon: PIrene Shipper MD;  Location: [facility];  Service: Endoscopy;  Laterality: N/A;   deviated septum repair     slight improvement   ESOPHAGOGASTRODUODENOSCOPY N/A 03/15/2018   Procedure: ESOPHAGOGASTRODUODENOSCOPY (EGD);  Surgeon: JMilus Banister MD;  [facility];  Service: Endoscopy;  Laterality: N/A;   IR IMAGING GUIDED PORT INSERTION  03/17/2018   LAPAROSCOPY N/A 08/25/2018   Procedure: LAPAROSCOPY DIAGNOSTIC;  Surgeon: WGreer Pickerel MD;  Location: [facility];  Service: General;  Laterality: N/A;   TONSILLECTOMY      SOCIAL HISTORY: Social History   Socioeconomic History   Marital status: Married    Spouse name: lisa   Number of children: 0   Years of education: Not on file   Highest education level: Not on file  Occupational History   Not on file  Social Needs   Financial resource strain: Not hard at all   Food insecurity:    Worry: Never true    Inability: Never true   Transportation needs:    Medical: No    Non-medical: No  Tobacco Use   Smoking status: Never Smoker   Smokeless tobacco: Never Used  Substance and Sexual Activity   Alcohol use: Yes    Comment: occasional   Drug use: No   Sexual activity: Yes  Lifestyle   Physical activity:    Days per week: 0 days    Minutes per session: 0 min   Stress: Not at all  Relationships   Social connections:    Talks on phone: More than  three times a week    Gets together: More than three times a week    Attends religious service: 1 to 4 times per year    Active member of club or organization: No    Attends meetings of clubs or organizations: Never    Relationship status: Married   Intimate partner violence:    Fear of current or ex partner: No    Emotionally abused: No    Physically abused: No    Forced sexual activity: No  Other Topics Concern   Not on file  Social History Narrative   Married 1985. No kids. 4 small dogs.  Works in Financial trader, residential      Hobbies: work on cars, Haematologist, exercise as able    FAMILY HISTORY: Family History  Problem Relation Age of Onset   Lung cancer Mother        smoker   Brain cancer Mother        metastasis   AAA (abdominal aortic aneurysm) Father        smoker    ALLERGIES:  is allergic to ciprofloxacin.  MEDICATIONS:  Current Outpatient Medications  Medication Sig Dispense Refill   acetaminophen (TYLENOL) 325 MG tablet Take 2 tablets (650 mg total) by mouth every 6 (six) hours as needed.     blood glucose meter kit and supplies Dispense based on patient and insurance preference. Use daily as directed. (E11.9). 1 each 0   ELIQUIS 5 MG TABS tablet TAKE 1 TABLET BY MOUTH TWICE A DAY 60 tablet 3   furosemide (LASIX) 20 MG tablet Take 20 mg by mouth as needed.     glucose blood (FREESTYLE TEST STRIPS) test strip Use to check blood sugar daily 100 each 4   JANUMET 50-1000 MG tablet TAKE 1 TABLET TWICE DAILY  WITH MEALS (Patient taking differently: Take by mouth daily. Patient taking it once daily) 180 tablet 3   lidocaine-prilocaine (EMLA) cream Apply 1 application topically as needed. 30 g 2   Magnesium 500 MG CAPS Take 1 capsule (500 mg total) by mouth 2 (two) times daily. (Patient taking differently: Take 500 mg by mouth daily. ) 60 capsule 0   Probiotic Product (PROBIOTIC-10) CAPS Take 1 capsule by mouth daily.     No current  facility-administered medications for this visit.     REVIEW OF SYSTEMS:    A 10+ POINT REVIEW OF SYSTEMS WAS OBTAINED including neurology, dermatology, psychiatry, cardiac, respiratory, lymph, extremities, GI, GU, Musculoskeletal, constitutional, breasts, reproductive, HEENT.  All pertinent positives are noted in the HPI.  All others are negative.   PHYSICAL EXAMINATION: ECOG PERFORMANCE STATUS: 1 - Symptomatic but completely ambulatory  There were no vitals filed for this visit. There were no vitals filed for this visit. .There is no height or weight on file to calculate BMI.  No Physical Exam performed, per Virtual Encounter.  LABORATORY DATA:   . CBC Latest Ref Rng & Units 12/06/2018 11/09/2018 09/28/2018  WBC 4.0 - 10.5 K/uL 3.4(L) 4.4 4.9  Hemoglobin 13.0 - 17.0 g/dL 13.2 12.0(L) 11.1(L)  Hematocrit 39.0 - 52.0 % 40.3 37.6(L) 35.9(L)  Platelets 150 - 400 K/uL 110(L) 177 203    . CMP Latest Ref Rng & Units 12/06/2018 11/09/2018 10/13/2018  Glucose 70 - 99 mg/dL 113(H) 91 75  BUN 8 - 23 mg/dL 11 9 9   Creatinine 0.61 - 1.24 mg/dL 0.88 0.79 0.85  Sodium 135 - 145 mmol/L 140 141 143  Potassium 3.5 - 5.1 mmol/L 4.4 4.0 4.3  Chloride 98 - 111 mmol/L 102 107 107  CO2 22 - 32 mmol/L 27 24 26   Calcium 8.9 - 10.3 mg/dL 10.1 9.5 10.1  Total Protein 6.5 - 8.1 g/dL 7.8 6.8 -  Total Bilirubin 0.3 - 1.2 mg/dL 0.7 0.3 -  Alkaline Phos 38 - 126 U/L 91 68 -  AST 15 - 41 U/L 29 32 -  ALT 0 - 44 U/L 60(H) 38 -   . Lab Results  Component Value Date   LDH 139 09/28/2018     03/17/18 Cytogenetics:   03/17/18 Colon Bx:   03/13/18 Peritoneum Biopsy:  RADIOGRAPHIC STUDIES:  I have personally reviewed the radiological images as listed and agreed with the findings in the report. No results found.  ASSESSMENT & PLAN:   63 y.o. male with  1. 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 No BM or CNS involvement.   04/18/18 PET/CT revealed Continued improvement in right colonic wall thickening and surrounding bulky masses consistent with treated lymphoma. No hypermetabolic activity to suggest residual tumor. There is some hypermetabolic activity associated with the sigmoid colon which demonstrates mild wall thickening, surrounding inflammation and underlying diverticulosis, suggesting mild diverticulitis. Suspected treatment related changes throughout the bone marrow. There is mildly increased activity within the clivus without clear corresponding finding on the CT images. Attention on follow-up recommended. Nonobstructing right renal calculi. 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   06/09/18 PET/CT revealedSoft tissue lesion within the ventral pelvis is again identified demonstrating mild to moderate increased uptake within SUV max of 5.61. Deauville criteria 4. Concerning for residual metabolically active tumor. 2. Similar appearance of diffusely thickened appendix within the right lower quadrant of the abdomen. Findings may reflect treatment related changes. 3. Increased radiotracer uptake throughout the bone marrow which is favored to represent treatment related changes. 4. Lad coronary artery atherosclerotic calcifications.   patient completed planned chemorx on 10/21  10/05/18 CT A/P revealed Persistent wall thickening in the sigmoid colon although a component of this could be due to diverticulosis. There is also a suggestion of wall thickening  and potential mucosal enhancement in the terminal ileum, as well as adjacent indistinct stranding in the mesenteric and omental adipose tissues roughly similar to the prior exam. Some of this may be treatment related. Inflammatory process such as Crohn's disease not excluded. 2. No current adenopathy. 3. Other imaging findings of potential clinical significance: Nonobstructive right nephrolithiasis. Aortic Atherosclerosis. Moderate prostatomegaly. Left foraminal impingement at L4-5.  S/p IMRT with 36 Gy over 20 fractions, between 11/09/18 and 12/06/18 with Dr. Isidore Moos  2.H/oRLE DVT and b/l PEon anticoagulation.  3.h/oGI bleeding from lymphoma involving bowels on presentation-- resolved at this time -- will need to monitor with anticoagulation.  4. DM2 with some increased hyperglycemia with steroids Plan -monitor  -continue Oral hypoglycemics and carb consistent diet at this time.   5. Small bowel obstruction/ileus with sigmoid thickening/stenosis .  CT scan - sigmoid obstructive lesion ? Scar tissue from resolved lymphoma vs residual lymphoma vs post inflammatory scarring from diverticulitis/limited perforation. Also has SBO from adhesions. Colonoscopy 08/21/2018 - no intramucosal lesions. Extrinsic compression of sigmoid colon. 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 Greer Pickerel  PLAN: -Discussed pt labwork from 12/06/18; WBC slightly low at 3.4k, PLT also a little low at 110k. HGB improved to 13.2. Mild increase in ALT at 60. Magnesium at 1.6. -Continue 529m Magnesium -Pt prefers not to take Imodium at this time -Discussed that hydration and nutrition need to be prioritized at this time and recommended eating well and staying well hydrated -Also recommend staying active, and encouraged pt to optimize this -Pt will let me know in the interim  if he would like to begin Dexamethasone or Marinol for appetite stimulation -Lasix as needed -Previously discussed the suspicious findings  from the CT for a gastrointestinal inflammatory process ? Previous Crohns -Discussed that re-anastomosis considerations will be evaluated after completing RT -Recommended that the pt track his blood sugars with a glucometer at home, and then discuss his DM management further with his PCP, as the pt has lost weight -Continue Eliquis  -Feet elevation and graded sports compression sock use, 27-20s mmHg -Did refer the pt to our nutritional therapist Ernestene Kiel - he has gained back his weight and is doing well. -Recommended that the pt continue to eat well, drink at least 48-64 oz of water each day, and walk 20-30 minutes each day.  -Will repeat PET/CT 8-12 weeks after the conclusion of RT, which concluded on 12/06/18 -Will see the pt back in 4 weeks   RTC with Dr [name] in 4 weeks   All of the patients questions were answered with apparent satisfaction. The patient knows to call the clinic with any problems, questions or concerns.  The total time spent in the appt was 22 minutes and more than 50% was on counseling and direct patient cares.    Sullivan Lone MD [place] AAHIVMS [facility] Hematology/Oncology Physician [facility]  (Office):       [phone] (Work cell):  [phone] (Fax):           [phone]  I, Baldwin [place], am acting as a scribe for Dr. Sullivan Lone.   .I have reviewed the above documentation for accuracy and completeness, and I agree with the above. [name] MD

## 2018-12-07 ENCOUNTER — Encounter: Payer: Self-pay | Admitting: Radiation Oncology

## 2018-12-07 ENCOUNTER — Telehealth: Payer: Self-pay | Admitting: Hematology

## 2018-12-07 ENCOUNTER — Inpatient Hospital Stay (HOSPITAL_BASED_OUTPATIENT_CLINIC_OR_DEPARTMENT_OTHER): Payer: BLUE CROSS/BLUE SHIELD | Admitting: Hematology

## 2018-12-07 ENCOUNTER — Inpatient Hospital Stay: Payer: BLUE CROSS/BLUE SHIELD

## 2018-12-07 DIAGNOSIS — C8378 Burkitt lymphoma, lymph nodes of multiple sites: Secondary | ICD-10-CM

## 2018-12-07 DIAGNOSIS — Z79899 Other long term (current) drug therapy: Secondary | ICD-10-CM

## 2018-12-07 DIAGNOSIS — Z86718 Personal history of other venous thrombosis and embolism: Secondary | ICD-10-CM | POA: Diagnosis not present

## 2018-12-07 DIAGNOSIS — E440 Moderate protein-calorie malnutrition: Secondary | ICD-10-CM

## 2018-12-07 DIAGNOSIS — Z7901 Long term (current) use of anticoagulants: Secondary | ICD-10-CM

## 2018-12-07 NOTE — Telephone Encounter (Signed)
Scheduled appt per 3/26 los.  Called patient and patient and his wife are aware of appt date and time.

## 2018-12-13 ENCOUNTER — Telehealth: Payer: Self-pay

## 2018-12-13 DIAGNOSIS — Z932 Ileostomy status: Secondary | ICD-10-CM | POA: Diagnosis not present

## 2018-12-13 NOTE — Telephone Encounter (Signed)
-----   Message from Angie Fava, LPN sent at 04/17/4882  2:13 PM EST ----- Regarding: office visit F/u in May

## 2018-12-13 NOTE — Telephone Encounter (Signed)
This is to be a Dr Ardis Hughs appointment.

## 2018-12-13 NOTE — Telephone Encounter (Signed)
I called and left him a message to call back for a May appointment on his cell #, no voice mail on his home #.

## 2018-12-15 ENCOUNTER — Other Ambulatory Visit: Payer: Self-pay | Admitting: *Deleted

## 2018-12-15 DIAGNOSIS — C8378 Burkitt lymphoma, lymph nodes of multiple sites: Secondary | ICD-10-CM

## 2018-12-20 ENCOUNTER — Encounter: Payer: Self-pay | Admitting: Radiation Oncology

## 2018-12-20 NOTE — Progress Notes (Signed)
  Radiation Oncology         [phone] ________________________________  Name: KANTON KAMEL MRN: [id number]  Date: 12/20/2018  DOB: 1956-01-25  I spoke with the patient's wife by phone today. Pt has diarrhea, low appetite, mucus per rectum. No cramping or blood in stool. I suspect this is acute irritability that would be expected after abdominal RT.  Discussed hydration, imodium use to slow stool, and BRAT diet.   Call if symptoms worsen. Otherwise, we will discuss how he is doing at his f/u in 2wks. -----------------------------------  [name], MD

## 2018-12-20 NOTE — Telephone Encounter (Signed)
Appointment with Dr Ardis Hughs on 01-31-2019 @ 945am

## 2018-12-28 ENCOUNTER — Telehealth: Payer: Self-pay | Admitting: *Deleted

## 2018-12-28 NOTE — Telephone Encounter (Signed)
Wife called. Radiation completed 3 weeks ago. Patient is experiencing some rectal diarrhea( blood tinged mucus). Dr. Lanell Persons aware of diarrhea and told patient it is tissue debris from radiation. Patient wants to know if he should continue Eliquis with blood tinged mucus. Per [name], Informed patient via wife: if minimally blood tinged and hgb stable, would continue med. Encouraged to contact office for any changes.  They verbalized understanding.

## 2019-01-01 ENCOUNTER — Inpatient Hospital Stay (HOSPITAL_COMMUNITY): Payer: BLUE CROSS/BLUE SHIELD

## 2019-01-01 ENCOUNTER — Inpatient Hospital Stay (HOSPITAL_COMMUNITY)
Admission: EM | Admit: 2019-01-01 | Discharge: 2019-01-16 | DRG: 394 | Disposition: A | Discharge status: Home / Self Care | Payer: BLUE CROSS/BLUE SHIELD | Attending: Internal Medicine | Admitting: Internal Medicine

## 2019-01-01 ENCOUNTER — Encounter (HOSPITAL_COMMUNITY): Payer: Self-pay | Admitting: Emergency Medicine

## 2019-01-01 ENCOUNTER — Telehealth: Payer: Self-pay

## 2019-01-01 ENCOUNTER — Other Ambulatory Visit: Payer: Self-pay

## 2019-01-01 ENCOUNTER — Encounter: Payer: Self-pay | Admitting: Radiation Oncology

## 2019-01-01 DIAGNOSIS — Z9221 Personal history of antineoplastic chemotherapy: Secondary | ICD-10-CM

## 2019-01-01 DIAGNOSIS — E119 Type 2 diabetes mellitus without complications: Secondary | ICD-10-CM | POA: Diagnosis present

## 2019-01-01 DIAGNOSIS — E861 Hypovolemia: Secondary | ICD-10-CM | POA: Diagnosis present

## 2019-01-01 DIAGNOSIS — R198 Other specified symptoms and signs involving the digestive system and abdomen: Secondary | ICD-10-CM | POA: Diagnosis not present

## 2019-01-01 DIAGNOSIS — E440 Moderate protein-calorie malnutrition: Secondary | ICD-10-CM | POA: Diagnosis not present

## 2019-01-01 DIAGNOSIS — D6959 Other secondary thrombocytopenia: Secondary | ICD-10-CM | POA: Diagnosis present

## 2019-01-01 DIAGNOSIS — L89302 Pressure ulcer of unspecified buttock, stage 2: Secondary | ICD-10-CM | POA: Diagnosis not present

## 2019-01-01 DIAGNOSIS — Z932 Ileostomy status: Secondary | ICD-10-CM | POA: Diagnosis not present

## 2019-01-01 DIAGNOSIS — Z79899 Other long term (current) drug therapy: Secondary | ICD-10-CM

## 2019-01-01 DIAGNOSIS — N200 Calculus of kidney: Secondary | ICD-10-CM | POA: Diagnosis present

## 2019-01-01 DIAGNOSIS — L8990 Pressure ulcer of unspecified site, unspecified stage: Secondary | ICD-10-CM | POA: Diagnosis present

## 2019-01-01 DIAGNOSIS — Z808 Family history of malignant neoplasm of other organs or systems: Secondary | ICD-10-CM

## 2019-01-01 DIAGNOSIS — Z881 Allergy status to other antibiotic agents status: Secondary | ICD-10-CM

## 2019-01-01 DIAGNOSIS — K520 Gastroenteritis and colitis due to radiation: Principal | ICD-10-CM | POA: Diagnosis present

## 2019-01-01 DIAGNOSIS — I959 Hypotension, unspecified: Secondary | ICD-10-CM | POA: Diagnosis present

## 2019-01-01 DIAGNOSIS — D708 Other neutropenia: Secondary | ICD-10-CM

## 2019-01-01 DIAGNOSIS — Z86718 Personal history of other venous thrombosis and embolism: Secondary | ICD-10-CM | POA: Diagnosis not present

## 2019-01-01 DIAGNOSIS — D61818 Other pancytopenia: Secondary | ICD-10-CM | POA: Diagnosis not present

## 2019-01-01 DIAGNOSIS — C8370 Burkitt lymphoma, unspecified site: Secondary | ICD-10-CM | POA: Diagnosis present

## 2019-01-01 DIAGNOSIS — Z8572 Personal history of non-Hodgkin lymphomas: Secondary | ICD-10-CM | POA: Diagnosis present

## 2019-01-01 DIAGNOSIS — K921 Melena: Secondary | ICD-10-CM

## 2019-01-01 DIAGNOSIS — Z86711 Personal history of pulmonary embolism: Secondary | ICD-10-CM

## 2019-01-01 DIAGNOSIS — Z933 Colostomy status: Secondary | ICD-10-CM

## 2019-01-01 DIAGNOSIS — E785 Hyperlipidemia, unspecified: Secondary | ICD-10-CM | POA: Diagnosis not present

## 2019-01-01 DIAGNOSIS — Z7901 Long term (current) use of anticoagulants: Secondary | ICD-10-CM

## 2019-01-01 DIAGNOSIS — R262 Difficulty in walking, not elsewhere classified: Secondary | ICD-10-CM | POA: Diagnosis not present

## 2019-01-01 DIAGNOSIS — Y842 Radiological procedure and radiotherapy as the cause of abnormal reaction of the patient, or of later complication, without mention of misadventure at the time of the procedure: Secondary | ICD-10-CM | POA: Diagnosis present

## 2019-01-01 DIAGNOSIS — R5081 Fever presenting with conditions classified elsewhere: Secondary | ICD-10-CM | POA: Diagnosis not present

## 2019-01-01 DIAGNOSIS — C8510 Unspecified B-cell lymphoma, unspecified site: Secondary | ICD-10-CM | POA: Diagnosis not present

## 2019-01-01 DIAGNOSIS — R000 Tachycardia, unspecified: Secondary | ICD-10-CM | POA: Diagnosis present

## 2019-01-01 DIAGNOSIS — E43 Unspecified severe protein-calorie malnutrition: Secondary | ICD-10-CM

## 2019-01-01 DIAGNOSIS — R740 Nonspecific elevation of levels of transaminase and lactic acid dehydrogenase [LDH]: Secondary | ICD-10-CM | POA: Diagnosis present

## 2019-01-01 DIAGNOSIS — E876 Hypokalemia: Secondary | ICD-10-CM | POA: Diagnosis present

## 2019-01-01 DIAGNOSIS — E860 Dehydration: Secondary | ICD-10-CM

## 2019-01-01 DIAGNOSIS — K565 Intestinal adhesions [bands], unspecified as to partial versus complete obstruction: Secondary | ICD-10-CM | POA: Diagnosis not present

## 2019-01-01 DIAGNOSIS — D696 Thrombocytopenia, unspecified: Secondary | ICD-10-CM | POA: Diagnosis not present

## 2019-01-01 DIAGNOSIS — R0602 Shortness of breath: Secondary | ICD-10-CM

## 2019-01-01 DIAGNOSIS — R197 Diarrhea, unspecified: Secondary | ICD-10-CM | POA: Diagnosis not present

## 2019-01-01 DIAGNOSIS — E871 Hypo-osmolality and hyponatremia: Secondary | ICD-10-CM | POA: Diagnosis not present

## 2019-01-01 DIAGNOSIS — E60 Dietary zinc deficiency: Secondary | ICD-10-CM | POA: Diagnosis not present

## 2019-01-01 DIAGNOSIS — E538 Deficiency of other specified B group vitamins: Secondary | ICD-10-CM | POA: Diagnosis present

## 2019-01-01 DIAGNOSIS — E872 Acidosis: Secondary | ICD-10-CM | POA: Diagnosis present

## 2019-01-01 DIAGNOSIS — I2510 Atherosclerotic heart disease of native coronary artery without angina pectoris: Secondary | ICD-10-CM | POA: Diagnosis present

## 2019-01-01 DIAGNOSIS — Z6828 Body mass index (BMI) 28.0-28.9, adult: Secondary | ICD-10-CM

## 2019-01-01 DIAGNOSIS — D709 Neutropenia, unspecified: Secondary | ICD-10-CM | POA: Clinically undetermined

## 2019-01-01 DIAGNOSIS — Z923 Personal history of irradiation: Secondary | ICD-10-CM

## 2019-01-01 DIAGNOSIS — K36 Other appendicitis: Secondary | ICD-10-CM | POA: Diagnosis not present

## 2019-01-01 DIAGNOSIS — K6389 Other specified diseases of intestine: Secondary | ICD-10-CM | POA: Diagnosis not present

## 2019-01-01 DIAGNOSIS — Z801 Family history of malignant neoplasm of trachea, bronchus and lung: Secondary | ICD-10-CM

## 2019-01-01 LAB — RETICULOCYTES
Immature Retic Fract: 12.9 % (ref 2.3–15.9)
RBC.: 2.78 MIL/uL — ABNORMAL LOW (ref 4.22–5.81)
Retic Count, Absolute: 37.5 10*3/uL (ref 19.0–186.0)
Retic Ct Pct: 1.4 % (ref 0.4–3.1)

## 2019-01-01 LAB — COMPREHENSIVE METABOLIC PANEL
ALT: 69 U/L — ABNORMAL HIGH (ref 0–44)
AST: 59 U/L — ABNORMAL HIGH (ref 15–41)
Albumin: 2.5 g/dL — ABNORMAL LOW (ref 3.5–5.0)
Alkaline Phosphatase: 120 U/L (ref 38–126)
Anion gap: 18 — ABNORMAL HIGH (ref 5–15)
BUN: 29 mg/dL — ABNORMAL HIGH (ref 8–23)
CO2: 26 mmol/L (ref 22–32)
Calcium: 8.1 mg/dL — ABNORMAL LOW (ref 8.9–10.3)
Chloride: 76 mmol/L — ABNORMAL LOW (ref 98–111)
Creatinine, Ser: 1.07 mg/dL (ref 0.61–1.24)
GFR calc Af Amer: >60 mL/min (ref >60)
GFR calc non Af Amer: >60 mL/min (ref >60)
Glucose, Bld: 218 mg/dL — ABNORMAL HIGH (ref 70–99)
Potassium: 3.1 mmol/L — ABNORMAL LOW (ref 3.5–5.1)
Sodium: 120 mmol/L — ABNORMAL LOW (ref 135–145)
Total Bilirubin: 1.4 mg/dL — ABNORMAL HIGH (ref 0.3–1.2)
Total Protein: 6.3 g/dL — ABNORMAL LOW (ref 6.5–8.1)

## 2019-01-01 LAB — CBC WITH DIFFERENTIAL/PLATELET
Abs Immature Granulocytes: 0.09 10*3/uL — ABNORMAL HIGH (ref 0.00–0.07)
Basophils Absolute: 0 10*3/uL (ref 0.0–0.1)
Basophils Relative: 1 %
Eosinophils Absolute: 0 10*3/uL (ref 0.0–0.5)
Eosinophils Relative: 0 %
HCT: 26.6 % — ABNORMAL LOW (ref 39.0–52.0)
Hemoglobin: 9.3 g/dL — ABNORMAL LOW (ref 13.0–17.0)
Immature Granulocytes: 4 %
Lymphocytes Relative: 4 %
Lymphs Abs: 0.1 10*3/uL — ABNORMAL LOW (ref 0.7–4.0)
MCH: 32.9 pg (ref 26.0–34.0)
MCHC: 35 g/dL (ref 30.0–36.0)
MCV: 94 fL (ref 80.0–100.0)
Monocytes Absolute: 0.2 10*3/uL (ref 0.1–1.0)
Monocytes Relative: 9 %
Neutro Abs: 1.9 10*3/uL (ref 1.7–7.7)
Neutrophils Relative %: 82 %
Platelets: 105 10*3/uL — ABNORMAL LOW (ref 150–400)
RBC: 2.83 MIL/uL — ABNORMAL LOW (ref 4.22–5.81)
RDW: 14.6 % (ref 11.5–15.5)
WBC: 2.3 10*3/uL — ABNORMAL LOW (ref 4.0–10.5)
nRBC: 0 % (ref 0.0–0.2)

## 2019-01-01 LAB — URINALYSIS, ROUTINE W REFLEX MICROSCOPIC
Bilirubin Urine: NEGATIVE
Glucose, UA: NEGATIVE mg/dL
Hgb urine dipstick: NEGATIVE
Ketones, ur: NEGATIVE mg/dL
Leukocytes,Ua: NEGATIVE
Nitrite: NEGATIVE
Protein, ur: 30 mg/dL — AB
Specific Gravity, Urine: 1.02 (ref 1.005–1.030)
pH: 5 (ref 5.0–8.0)

## 2019-01-01 LAB — BASIC METABOLIC PANEL
Anion gap: 11 (ref 5–15)
BUN: 22 mg/dL (ref 8–23)
CO2: 30 mmol/L (ref 22–32)
Calcium: 7.4 mg/dL — ABNORMAL LOW (ref 8.9–10.3)
Chloride: 81 mmol/L — ABNORMAL LOW (ref 98–111)
Creatinine, Ser: 0.83 mg/dL (ref 0.61–1.24)
GFR calc Af Amer: >60 mL/min (ref >60)
GFR calc non Af Amer: >60 mL/min (ref >60)
Glucose, Bld: 118 mg/dL — ABNORMAL HIGH (ref 70–99)
Potassium: 2.7 mmol/L — CL (ref 3.5–5.1)
Sodium: 122 mmol/L — ABNORMAL LOW (ref 135–145)

## 2019-01-01 LAB — IRON AND TIBC
Iron: 58 ug/dL (ref 45–182)
Saturation Ratios: 30 % (ref 17.9–39.5)
TIBC: 191 ug/dL — ABNORMAL LOW (ref 250–450)
UIBC: 133 ug/dL

## 2019-01-01 LAB — C DIFFICILE QUICK SCREEN W PCR REFLEX
C Diff antigen: NEGATIVE
C Diff interpretation: NOT DETECTED
C Diff toxin: NEGATIVE

## 2019-01-01 LAB — FOLATE: Folate: 14 ng/mL (ref >5.9)

## 2019-01-01 LAB — GLUCOSE, CAPILLARY
Glucose-Capillary: 122 mg/dL — ABNORMAL HIGH (ref 70–99)
Glucose-Capillary: 106 mg/dL — ABNORMAL HIGH (ref 70–99)

## 2019-01-01 LAB — FERRITIN: Ferritin: 3518 ng/mL — ABNORMAL HIGH (ref 24–336)

## 2019-01-01 LAB — MRSA PCR SCREENING: MRSA by PCR: NEGATIVE

## 2019-01-01 LAB — MAGNESIUM: Magnesium: 2 mg/dL (ref 1.7–2.4)

## 2019-01-01 LAB — VITAMIN B12: Vitamin B-12: 959 pg/mL — ABNORMAL HIGH (ref 180–914)

## 2019-01-01 MED ORDER — ONDANSETRON HCL 4 MG PO TABS: 4.0000 mg | ORAL_TABLET | Freq: Four times a day (QID) | ORAL | Status: DC | PRN

## 2019-01-01 MED ORDER — CHLORHEXIDINE GLUCONATE CLOTH 2 % EX PADS
6.0000 | MEDICATED_PAD | Freq: Every day | CUTANEOUS | Status: DC
  Administered 2019-01-02 – 2019-01-05 (×3): 6 via TOPICAL

## 2019-01-01 MED ORDER — INSULIN ASPART 100 UNIT/ML ~~LOC~~ SOLN
0.0000 [IU] | Freq: Three times a day (TID) | SUBCUTANEOUS | Status: DC
  Administered 2019-01-01 – 2019-01-02 (×2): 1 [IU] via SUBCUTANEOUS
  Administered 2019-01-03: 2 [IU] via SUBCUTANEOUS
  Administered 2019-01-04 (×2): 3 [IU] via SUBCUTANEOUS
  Administered 2019-01-05 (×2): 2 [IU] via SUBCUTANEOUS
  Administered 2019-01-06: 1 [IU] via SUBCUTANEOUS
  Administered 2019-01-06 – 2019-01-11 (×6): 2 [IU] via SUBCUTANEOUS
  Administered 2019-01-12: 3 [IU] via SUBCUTANEOUS
  Administered 2019-01-12: 5 [IU] via SUBCUTANEOUS
  Administered 2019-01-12: 2 [IU] via SUBCUTANEOUS
  Administered 2019-01-13: 1 [IU] via SUBCUTANEOUS
  Administered 2019-01-13: 2 [IU] via SUBCUTANEOUS
  Administered 2019-01-14: 1 [IU] via SUBCUTANEOUS
  Administered 2019-01-14: 3 [IU] via SUBCUTANEOUS
  Administered 2019-01-14: 5 [IU] via SUBCUTANEOUS
  Administered 2019-01-15: 2 [IU] via SUBCUTANEOUS
  Administered 2019-01-16: 1 [IU] via SUBCUTANEOUS

## 2019-01-01 MED ORDER — ACETAMINOPHEN 650 MG RE SUPP: 650.0000 mg | Freq: Four times a day (QID) | RECTAL | Status: DC | PRN

## 2019-01-01 MED ORDER — SODIUM CHLORIDE (PF) 0.9 % IJ SOLN
INTRAMUSCULAR | Status: AC
  Filled 2019-01-01: qty 50

## 2019-01-01 MED ORDER — LACTATED RINGERS IV BOLUS
1000.0000 mL | Freq: Once | INTRAVENOUS | Status: AC
  Administered 2019-01-01: 1000 mL via INTRAVENOUS

## 2019-01-01 MED ORDER — SODIUM CHLORIDE 0.9 % IV BOLUS
1000.0000 mL | Freq: Once | INTRAVENOUS | Status: AC
  Administered 2019-01-01: 1000 mL via INTRAVENOUS

## 2019-01-01 MED ORDER — IOHEXOL 300 MG/ML  SOLN
100.0000 mL | Freq: Once | INTRAMUSCULAR | Status: AC | PRN
  Administered 2019-01-01: 100 mL via INTRAVENOUS

## 2019-01-01 MED ORDER — POTASSIUM CHLORIDE 10 MEQ/100ML IV SOLN
10.0000 meq | INTRAVENOUS | Status: AC
  Administered 2019-01-01 (×4): 10 meq via INTRAVENOUS
  Filled 2019-01-01 (×4): qty 100

## 2019-01-01 MED ORDER — SODIUM CHLORIDE 0.9% FLUSH
10.0000 mL | INTRAVENOUS | Status: DC | PRN
  Administered 2019-01-04: 10 mL
  Filled 2019-01-01: qty 40

## 2019-01-01 MED ORDER — ACETAMINOPHEN 325 MG PO TABS
650.0000 mg | ORAL_TABLET | Freq: Four times a day (QID) | ORAL | Status: DC | PRN
  Administered 2019-01-03 – 2019-01-15 (×4): 650 mg via ORAL
  Filled 2019-01-01 (×4): qty 2

## 2019-01-01 MED ORDER — SODIUM CHLORIDE 0.9 % IV SOLN
INTRAVENOUS | Status: DC
  Administered 2019-01-01 – 2019-01-11 (×17): via INTRAVENOUS

## 2019-01-01 MED ORDER — APIXABAN 5 MG PO TABS
5.0000 mg | ORAL_TABLET | Freq: Two times a day (BID) | ORAL | Status: DC
  Administered 2019-01-01 – 2019-01-02 (×3): 5 mg via ORAL
  Filled 2019-01-01 (×3): qty 1

## 2019-01-01 MED ORDER — POTASSIUM CHLORIDE 10 MEQ/100ML IV SOLN
10.0000 meq | INTRAVENOUS | Status: AC
  Administered 2019-01-01 (×4): 10 meq via INTRAVENOUS
  Filled 2019-01-01 (×4): qty 100

## 2019-01-01 MED ORDER — INSULIN ASPART 100 UNIT/ML ~~LOC~~ SOLN
0.0000 [IU] | Freq: Every day | SUBCUTANEOUS | Status: DC
  Administered 2019-01-13: 2 [IU] via SUBCUTANEOUS

## 2019-01-01 MED ORDER — ONDANSETRON HCL 4 MG/2ML IJ SOLN: 4.0000 mg | Freq: Four times a day (QID) | INTRAMUSCULAR | Status: DC | PRN

## 2019-01-01 NOTE — Progress Notes (Signed)
Critical lab value k 2.7 Rizwan notified. More potassium ordered.

## 2019-01-01 NOTE — Progress Notes (Signed)
I called patient's wife to provide support.  Upon reviewing his chart, it appears many tests are still pending.  I suspect that radiation colitis is high on the differential diagnosis but it is important to rule out other aggravating factors, which the team is doing. Patient's involved site from his lymphoma was sizeable, and a large area of the bowel received 36Gy/20 fractions, completed March 25th 2020.  Appreciate that GI has been consulted to help diagnosis and heal the underlying issue.  Encouraged patient's wife to call if I can help future.  Emotional support given.  I know this has been a very difficult time, and it's especially hard that she cannot be in the hospital with him due to the pandemic. She expressed gratitude for the call. -----------------------------------  [name], MD

## 2019-01-01 NOTE — Progress Notes (Signed)
  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  He continued to eat fairly well (with some decrease in appetite) and feel well without significant acute GI effects. He noted mild pain/soreness in the abdomen with standing.  Plan: The patient has completed radiation treatment. The patient will followup w/radiation oncology in one month. I advised them to call or return sooner if they have any questions or concerns related to their recovery or treatment.  -----------------------------------  [name], MD

## 2019-01-01 NOTE — ED Notes (Signed)
ED TO INPATIENT HANDOFF REPORT  ED Nurse Name and Phone #:    S Name/Age/Gender [name] 63 y.o. male Room/Bed: WA14/WA14  Code Status   Code Status: Full Code  Home/SNF/Other Home Patient oriented to: self, place, time and situation Is this baseline? Yes   Triage Complete: Triage complete  Chief Complaint ca pt;diarrhea;weakness;confusion  Triage Note Pt last chemotherapy 4 weeks ago; ongoing/worsening weakness and diarrhea since. 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 Pt denies pain; no fever, cough, or COVID contacts per pt/wife.   Allergies Allergies  Allergen Reactions  . Ciprofloxacin Other (See Comments)    Leg tingling    Level of Care/Admitting Diagnosis ED Disposition    ED Disposition Condition Comment   Admit  Hospital Area: [facility] [[id number]]  Level of Care: Med-Surg [16]  Covid Evaluation: N/A  Diagnosis: Hyponatremia [161096]  Admitting Physician: [place], [place]  Attending Physician: Debbe Odea [[id number]]  Estimated length of stay: past midnight tomorrow  Certification:: I certify this patient will need inpatient services for at least 2 midnights  PT Class (Do Not Modify): Inpatient [101]  PT Acc Code (Do Not Modify): Private [1]       B Medical/Surgery History Past Medical History:  Diagnosis Date  . ALLERGIC RHINITIS   . Cancer ([place])    Lymphoma   . Diabetes mellitus   . Hyperlipidemia    Past Surgical History:  Procedure Laterality Date  . BIOPSY  03/15/2018   Procedure: BIOPSY;  Surgeon: Milus Banister, MD;  Location: [name];  Service: Endoscopy;;  . BOWEL RESECTION N/A 08/25/2018   Procedure: LAPROSCOPIC LOOP ILEOSTOMY;  Surgeon: Greer Pickerel, MD;  Location: [facility];  Service: General;  Laterality: N/A;  . CLEFT PALATE REPAIR    . COLONOSCOPY N/A 03/15/2018   Procedure: COLONOSCOPY;  Surgeon: Milus Banister, MD;  Location: [facility];  Service: Endoscopy;  Laterality: N/A;  . COLONOSCOPY N/A  08/20/2018   Procedure: COLONOSCOPY;  Surgeon: Irene Shipper, MD;  Location: [facility];  Service: Endoscopy;  Laterality: N/A;  . deviated septum repair     slight improvement  . ESOPHAGOGASTRODUODENOSCOPY N/A 03/15/2018   Procedure: ESOPHAGOGASTRODUODENOSCOPY (EGD);  Surgeon: Milus Banister, MD;  Location: [name];  Service: Endoscopy;  Laterality: N/A;  . IR IMAGING GUIDED PORT INSERTION  03/17/2018  . LAPAROSCOPY N/A 08/25/2018   Procedure: LAPAROSCOPY DIAGNOSTIC;  Surgeon: Greer Pickerel, MD;  Location: [facility];  Service: General;  Laterality: N/A;  . TONSILLECTOMY       A IV Location/Drains/Wounds Patient Lines/Drains/Airways Status   Active Line/Drains/Airways    Name:   Placement date:   Placement time:   Site:   Days:   Implanted Port 03/27/18   03/27/18    1141    -   280   Peripheral IV 01/01/19   01/01/19    1127    -   less than 1   Ileostomy Loop RUQ   08/25/18    1138    RUQ   129   Incision (Closed) 08/25/18 Abdomen Other (Comment)   08/25/18    1120     129   Incision - 3 Ports Abdomen Umbilicus Left;Upper Left   08/25/18    1115     129          Intake/Output Last 24 hours No intake or output data in the 24 hours ending 01/01/19 1305  Labs/Imaging Results for orders placed or performed during the hospital  encounter of 01/01/19 (from the past 48 hour(s))  Urinalysis, Routine w reflex microscopic     Status: Abnormal   Collection Time: 01/01/19 11:08 AM  Result Value Ref Range   Color, Urine YELLOW YELLOW   APPearance HAZY (A) CLEAR   Specific Gravity, Urine 1.020 1.005 - 1.030   pH 5.0 5.0 - 8.0   Glucose, UA NEGATIVE NEGATIVE mg/dL   Hgb urine dipstick NEGATIVE NEGATIVE   Bilirubin Urine NEGATIVE NEGATIVE   Ketones, ur NEGATIVE NEGATIVE mg/dL   Protein, ur 30 (A) NEGATIVE mg/dL   Nitrite NEGATIVE NEGATIVE   Leukocytes,Ua NEGATIVE NEGATIVE   RBC / HPF 0-5 0 - 5 RBC/hpf   WBC, UA 0-5 0 - 5 WBC/hpf   Bacteria, UA RARE (A) NONE SEEN   Squamous  Epithelial / LPF 0-5 0 - 5   Mucus PRESENT    Hyaline Casts, UA PRESENT     Comment: Performed at [facility], [place] [address], [place], [place] [address]  Comprehensive metabolic panel     Status: Abnormal   Collection Time: 01/01/19 11:20 AM  Result Value Ref Range   Sodium 120 (L) 135 - 145 mmol/L   Potassium 3.1 (L) 3.5 - 5.1 mmol/L   Chloride 76 (L) 98 - 111 mmol/L   CO2 26 22 - 32 mmol/L   Glucose, Bld 218 (H) 70 - 99 mg/dL   BUN 29 (H) 8 - 23 mg/dL   Creatinine, Ser 1.07 0.61 - 1.24 mg/dL   Calcium 8.1 (L) 8.9 - 10.3 mg/dL   Total Protein 6.3 (L) 6.5 - 8.1 g/dL   Albumin 2.5 (L) 3.5 - 5.0 g/dL   AST 59 (H) 15 - 41 U/L   ALT 69 (H) 0 - 44 U/L   Alkaline Phosphatase 120 38 - 126 U/L   Total Bilirubin 1.4 (H) 0.3 - 1.2 mg/dL   GFR calc non Af Amer >60 >60 mL/min   GFR calc Af Amer >60 >60 mL/min   Anion gap 18 (H) 5 - 15    Comment: Performed at [facility], [place] [address] [place], [place] [address]  CBC with Differential     Status: Abnormal   Collection Time: 01/01/19 11:20 AM  Result Value Ref Range   WBC 2.3 (L) 4.0 - 10.5 K/uL   RBC 2.83 (L) 4.22 - 5.81 MIL/uL   Hemoglobin 9.3 (L) 13.0 - 17.0 g/dL   HCT 26.6 (L) 39.0 - 52.0 %   MCV 94.0 80.0 - 100.0 fL   MCH 32.9 26.0 - 34.0 pg   MCHC 35.0 30.0 - 36.0 g/dL   RDW 14.6 11.5 - 15.5 %   Platelets 105 (L) 150 - 400 K/uL    Comment: REPEATED TO VERIFY PLATELET COUNT CONFIRMED BY SMEAR SPECIMEN CHECKED FOR CLOTS Immature Platelet Fraction may be clinically indicated, consider ordering this additional test [id number]    nRBC 0.0 0.0 - 0.2 %   Neutrophils Relative % 82 %   Neutro Abs 1.9 1.7 - 7.7 K/uL   Lymphocytes Relative 4 %   Lymphs Abs 0.1 (L) 0.7 - 4.0 K/uL   Monocytes Relative 9 %   Monocytes Absolute 0.2 0.1 - 1.0 K/uL   Eosinophils Relative 0 %   Eosinophils Absolute 0.0 0.0 - 0.5 K/uL   Basophils Relative 1 %   Basophils Absolute 0.0 0.0 - 0.1 K/uL   WBC  Morphology TOXIC GRANULATION    Immature Granulocytes 4 %   Abs Immature Granulocytes  0.09 (H) 0.00 - 0.07 K/uL    Comment: Performed at [facility], [place] [address], [place], [place] [address]  Magnesium     Status: None   Collection Time: 01/01/19 11:20 AM  Result Value Ref Range   Magnesium 2.0 1.7 - 2.4 mg/dL    Comment: Performed at [facility], [place] [address] [place], [place] [address]   No results found.  Pending Labs Unresulted Labs (From admission, onward)    Start     Ordered   01/02/19 0500  Comprehensive metabolic panel  Tomorrow morning,   R     01/01/19 1254   01/02/19 0500  CBC  Tomorrow morning,   R     01/01/19 1254          Vitals/Pain Today's Vitals   01/01/19 1128 01/01/19 1130 01/01/19 1200 01/01/19 1230  BP:  100/62 103/60 117/72  Pulse:  90 (!) 113 98  Resp:  15 (!) 22 18  Temp:      TempSrc:      SpO2:  97% 100% 99%  Weight:      Height:      PainSc: 0-No pain       Isolation Precautions No active isolations  Medications Medications  apixaban (ELIQUIS) tablet 5 mg (has no administration in time range)  acetaminophen (TYLENOL) tablet 650 mg (has no administration in time range)    Or  acetaminophen (TYLENOL) suppository 650 mg (has no administration in time range)  ondansetron (ZOFRAN) tablet 4 mg (has no administration in time range)    Or  ondansetron (ZOFRAN) injection 4 mg (has no administration in time range)  insulin aspart (novoLOG) injection 0-9 Units (has no administration in time range)  insulin aspart (novoLOG) injection 0-5 Units (has no administration in time range)  sodium chloride 0.9 % bolus 1,000 mL (0 mLs Intravenous Stopped 01/01/19 1202)  lactated ringers bolus 1,000 mL (1,000 mLs Intravenous Bolus from Bag 01/01/19 1205)    Mobility walks High fall risk   Focused Assessments Neuro Assessment Handoff:  Swallow screen pass? Yes          Neuro Assessment:   Neuro Checks:       Last Documented NIHSS Modified Score:   Has TPA been given? No If patient is a Neuro Trauma and patient is going to OR before floor call report to [place] nurse: [phone] or [phone]     R Recommendations: See Admitting Provider Note  Report given to: Clarise Cruz, RN  Additional Notes:

## 2019-01-01 NOTE — H&P (Addendum)
History and Physical    DUVAL MACLEOD  XNT:[id number]  DOB: [birth year]  DOA: 01/01/2019 PCP: [name], MD   Patient coming from: home  Chief Complaint: diarrhea and weakness  HPI: Mark Frey is a 63 y.o. 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 He seems to be having trouble on elaborating on his history and gives short answers only rather than details. He has no trouble with eating and has not had any vomiting or abdominal pain. He states he is emptying his colostomy bag 2 x day. He is currently using a walker and a cane to help him get around and is very weak.  No fever, chills, sweats. No sore throat or cough. No body aches. No trouble with urinating. No other complaints.  Further history has been given by his wife who states that he has actually been passing mucous and blood via his rectum which is why he came to the ED. She was told by Dr Isidore Moos that it may be due to radiation.  She notes that he is confused compared to his baseline.   He became very weak on Friday and was beginning to get confused. He was having trouble walking even with a walker when he typically does not use a walker.  He has not been eating much for 3.5 wks now.   ED Course: sodium 120, K 3.1.  WBC 2.3, Hb 9.3 (has dropped from 13.2 in 3/25), platelets 105 Toxic granulation noted  Hyaline casts on UA  Review of Systems:  All other systems reviewed and apart from HPI, are negative.  Past Medical History:  Diagnosis Date  . ALLERGIC RHINITIS   . Cancer ([place])    Lymphoma   . Diabetes mellitus   . Hyperlipidemia     Past Surgical History:  Procedure Laterality Date  . BIOPSY  03/15/2018   Procedure: BIOPSY;  Surgeon: Milus Banister, MD;  Location: [name];  Service:  Endoscopy;;  . BOWEL RESECTION N/A 08/25/2018   Procedure: LAPROSCOPIC LOOP ILEOSTOMY;  Surgeon: Greer Pickerel, MD;  Location: [facility];  Service: General;  Laterality: N/A;  . CLEFT PALATE REPAIR    . COLONOSCOPY N/A 03/15/2018   Procedure: COLONOSCOPY;  Surgeon: Milus Banister, MD;  Location[facility];  Service: Endoscopy;  Laterality: N/A;  . COLONOSCOPY N/A 08/20/2018   Procedure: COLONOSCOPY;  Surgeon: Irene Shipper, MD;  Location[facility];  Service: Endoscopy;  Laterality: N/A;  . deviated septum repair     slight improvement  . ESOPHAGOGASTRODUODENOSCOPY N/A 03/15/2018   Procedure: ESOPHAGOGASTRODUODENOSCOPY (EGD);  Surgeon: Milus Banister, MD;  Location: [name];  Service: Endoscopy;  Laterality: N/A;  . IR IMAGING GUIDED PORT INSERTION  03/17/2018  . LAPAROSCOPY N/A 08/25/2018   Procedure: LAPAROSCOPY DIAGNOSTIC;  Surgeon: Greer Pickerel, MD;  Location: [facility];  Service: General;  Laterality: N/A;  . TONSILLECTOMY      Social History:   reports that he has never smoked. He has never used smokeless tobacco. He reports current alcohol use. He reports that he does not use drugs.  Allergies  Allergen Reactions  . Ciprofloxacin Other (See Comments)    Leg tingling    Family History  Problem Relation Age of Onset  . Lung cancer Mother  smoker  . Brain cancer Mother        metastasis  . AAA (abdominal aortic aneurysm) Father        smoker     Prior to Admission medications   Medication Sig Start Date End Date Taking? Authorizing Provider  ELIQUIS 5 MG TABS tablet TAKE 1 TABLET BY MOUTH TWICE A DAY Patient taking differently: Take 5 mg by mouth 2 (two) times daily.  10/01/18  Yes [name], MD  JANUMET 50-1000 MG tablet TAKE 1 TABLET TWICE DAILY  WITH MEALS Patient taking differently: Take 1 tablet by mouth 2 (two) times daily with a meal.  11/29/17  Yes [name], MD  Magnesium 500 MG CAPS Take 1 capsule (500 mg total) by mouth 2 (two) times  daily. Patient taking differently: Take 500 mg by mouth 2 (two) times a day.  06/27/18  Yes [name], MD  acetaminophen (TYLENOL) 325 MG tablet Take 2 tablets (650 mg total) by mouth every 6 (six) hours as needed. Patient not taking: Reported on 01/01/2019 08/28/18   Kinnie Feil, MD  blood glucose meter kit and supplies Dispense based on patient and insurance preference. Use daily as directed. (E11.9). 03/31/18   [name], MD  glucose blood (FREESTYLE TEST STRIPS) test strip Use to check blood sugar daily 03/30/18   [name], MD  lidocaine-prilocaine (EMLA) cream Apply 1 application topically as needed. Patient not taking: Reported on 01/01/2019 03/27/18   Truitt Merle, MD    Physical Exam: Wt Readings from Last 3 Encounters:  01/01/19 90.7 kg  11/09/18 102.4 kg  10/30/18 100.3 kg   Vitals:   01/01/19 1200 01/01/19 1230 01/01/19 1300 01/01/19 1308  BP: 103/60 117/72 116/76   Pulse: (!) 113 98 (!) 106 88  Resp: (!) _0 Temp:      TempSrc:      SpO2: 100% 99% 97%   Weight:      Height:          Constitutional:  Calm & comfortable Eyes: PERRLA, lids and conjunctivae normal ENT:  Mucous membranes are moist.  Pharynx clear of exudate   Normal dentition.  Neck: Supple, no masses  Respiratory:  Clear to auscultation bilaterally  Normal respiratory effort.  Cardiovascular:  S1 & S2 heard, regular rate and rhythm No Murmurs Abdomen:  Non distended No tenderness, No masses Bowel sounds normal Ileostomy bag has watery brown stool Extremities:  No clubbing / cyanosis No pedal edema No joint deformity    Skin:  No rashes, lesions or ulcers Neurologic:  AAO x 3 CN 2-12 grossly intact Sensation intact Strength 5/5 in all 4 extremities Psychiatric:  Normal Mood and affect    Labs on Admission: I have personally reviewed following labs and imaging studies  CBC: Recent Labs  Lab 01/01/19 1120  WBC 2.3*  NEUTROABS 1.9  HGB 9.3*   HCT 26.6*  MCV 94.0  PLT 794*   Basic Metabolic Panel: Recent Labs  Lab 01/01/19 1120  NA 120*  K 3.1*  CL 76*  CO2 26  GLUCOSE 218*  BUN 29*  CREATININE 1.07  CALCIUM 8.1*  MG 2.0   GFR: Estimated Creatinine Clearance: 79.7 mL/min (by C-G formula based on SCr of 1.07 mg/dL). Liver Function Tests: Recent Labs  Lab 01/01/19 1120  AST 59*  ALT 69*  ALKPHOS 120  BILITOT 1.4*  PROT 6.3*  ALBUMIN 2.5*   No results for input(s): LIPASE, AMYLASE  in the last 168 hours. No results for input(s): AMMONIA in the last 168 hours. Coagulation Profile: No results for input(s): INR, PROTIME in the last 168 hours. Cardiac Enzymes: No results for input(s): CKTOTAL, CKMB, CKMBINDEX, TROPONINI in the last 168 hours. BNP (last 3 results) No results for input(s): PROBNP in the last 8760 hours. HbA1C: No results for input(s): HGBA1C in the last 72 hours. CBG: No results for input(s): GLUCAP in the last 168 hours. Lipid Profile: No results for input(s): CHOL, HDL, LDLCALC, TRIG, CHOLHDL, LDLDIRECT in the last 72 hours. Thyroid Function Tests: No results for input(s): TSH, T4TOTAL, FREET4, T3FREE, THYROIDAB in the last 72 hours. Anemia Panel: No results for input(s): VITAMINB12, FOLATE, FERRITIN, TIBC, IRON, RETICCTPCT in the last 72 hours. Urine analysis:    Component Value Date/Time   COLORURINE YELLOW 01/01/2019 1108   APPEARANCEUR HAZY (A) 01/01/2019 1108   LABSPEC 1.020 01/01/2019 1108   PHURINE 5.0 01/01/2019 1108   GLUCOSEU NEGATIVE 01/01/2019 1108   HGBUR NEGATIVE 01/01/2019 1108   HGBUR negative 01/20/2009 0840   BILIRUBINUR NEGATIVE 01/01/2019 1108   BILIRUBINUR Small 01/30/2018 1652   KETONESUR NEGATIVE 01/01/2019 1108   PROTEINUR 30 (A) 01/01/2019 1108   UROBILINOGEN 2.0 (A) 01/30/2018 1652   UROBILINOGEN 0.2 01/20/2009 0840   NITRITE NEGATIVE 01/01/2019 1108   LEUKOCYTESUR NEGATIVE 01/01/2019 1108   Sepsis Labs: _0 (procalcitonin:4,lacticidven:4) )No  results found for this or any previous visit (from the past 240 hour(s)).   Radiological Exams on Admission: No results found.  EKG: Independently reviewed. Sinus tachycardia   Assessment/Plan Principal Problem:   Hyponatremia with confusion - likely due to dehydration and poor oral intake - he has received 1 L of LR and 1 L of NS in the ED - will check urine sodium and osmolality but these may be inaccurate due to IVF that have already been given - repeat Bmet now, if sodium is not improving, may need to start 3% saline  Active Problems:   Hypokalemia - replace  Blood and mucous via rectum - ? Radiation colitis- obtain CT abd/pelvis and GI consult - regular diet for now  Diarrhea  - stool in ileostomy is very watery - he has been using imodium at home - check stool studies  - follow stool output to determine volume of stool    Pancytopenia  - have spoken with Dr Nunzio Cobbs will see the patient tomorrow   - anemia is likely due to slow blood loss - vit B12 deficiency can cause pancytopenia - check anemia panel - interestingly, the leukopenia is mainly lymphopenia based on the differential   DM 2 - SSI for now  Mildly elevated LFTs - follow     High grade B-cell lymphoma  - has completed treatment with chemo and radiation  Weight loss  - dietician consulted   DVT prophylaxis: Xarelto  Code Status: Full code  Family Communication: wife over the phone  Disposition Plan:   Consults called: GI,  onc - Dr [name]  Admission status: inpatient    Debbe Odea MD [facility] Pager: [email or web address] Password [email or web address] 7PM-7AM, please contact night-coverage   01/01/2019, 1:09 PM

## 2019-01-01 NOTE — ED Triage Notes (Signed)
Pt last chemotherapy 4 weeks ago; ongoing/worsening weakness and diarrhea since. 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 Pt denies pain; no fever, cough, or COVID contacts per pt/wife.

## 2019-01-01 NOTE — Consult Note (Signed)
Consultation   Referring Provider: [facility] hospitalist / Wynelle Cleveland MD Primary Care Physician:  [name], MD Primary Gastroenterologist:  Dr.Jacobs  Reason for Consultation:  Diarrhea, bloody mucoid stool  HPI: Mark Frey is a 63 y.o. 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 He underwent chemo therapy , then in December presenting with obstructive sxs and eventually underwent Colonoscopy with Dr Henrene Pastor which showed an extrinsic obstruction of the sigmoid - scope was able to be passed beyond obstruction and no other lesions found. He then had loop  Ileostomy. He started radiation of abdomen in March 2020 , and just completed that . Pt is currently a poor historian, but was brought in by family with progressive weakness, new confusion, poor intake over the past week, and increase in ostomy output-with intermittent small amts of blood. He has also been passing some bloody mucoid material from rectum . No fever/chills, no abdominal pain, no nausea/ vomiting.  Labs today show a pancytopenia, Na 120. K3.1, normal Creat, and transaminitis.  Review of most recent labs from 2/20 normal LFT's , and CBC.  CT abd/pelvis  Is pending.    Past Medical History:  Diagnosis Date  . ALLERGIC RHINITIS   . Cancer ([place])    Lymphoma   . Diabetes mellitus   . Hyperlipidemia     Past Surgical History:  Procedure Laterality Date  . BIOPSY  03/15/2018   Procedure: BIOPSY;  Surgeon: Milus Banister, MD;  Location: [name];  Service: Endoscopy;;  . BOWEL RESECTION N/A 08/25/2018   Procedure: LAPROSCOPIC LOOP ILEOSTOMY;  Surgeon: Greer Pickerel, MD;  Location: [facility];  Service: General;  Laterality: N/A;  . CLEFT PALATE REPAIR    . COLONOSCOPY N/A 03/15/2018   Procedure: COLONOSCOPY;  Surgeon: Milus Banister, MD;   Location: [facility];  Service: Endoscopy;  Laterality: N/A;  . COLONOSCOPY N/A 08/20/2018   Procedure: COLONOSCOPY;  Surgeon: Irene Shipper, MD;  Location: [facility];  Service: Endoscopy;  Laterality: N/A;  . deviated septum repair     slight improvement  . ESOPHAGOGASTRODUODENOSCOPY N/A 03/15/2018   Procedure: ESOPHAGOGASTRODUODENOSCOPY (EGD);  Surgeon: Milus Banister, MD;  Location: [name];  Service: Endoscopy;  Laterality: N/A;  . IR IMAGING GUIDED PORT INSERTION  03/17/2018  . LAPAROSCOPY N/A 08/25/2018   Procedure: LAPAROSCOPY DIAGNOSTIC;  Surgeon: Greer Pickerel, MD;  Location: [facility];  Service: General;  Laterality: N/A;  . TONSILLECTOMY      Prior to Admission medications   Medication Sig Start Date End Date Taking? Authorizing Provider  ELIQUIS 5 MG TABS tablet TAKE 1 TABLET BY MOUTH TWICE A DAY Patient taking differently: Take 5 mg by mouth 2 (two) times daily.  10/01/18  Yes [name], MD  JANUMET 50-1000 MG tablet TAKE 1 TABLET TWICE DAILY  WITH MEALS Patient taking differently: Take 1 tablet by mouth 2 (two) times daily with a meal.  11/29/17  Yes [name], MD  Magnesium 500 MG CAPS Take 1 capsule (500 mg total) by mouth 2 (two) times daily. Patient taking differently: Take 500 mg by mouth 2 (two) times a day.  06/27/18  Yes [name], MD  acetaminophen (TYLENOL) 325 MG tablet Take 2 tablets (650 mg total) by mouth every 6 (six) hours as needed. Patient not  taking: Reported on 01/01/2019 08/28/18   Kinnie Feil, MD  blood glucose meter kit and supplies Dispense based on patient and insurance preference. Use daily as directed. (E11.9). 03/31/18   [name], MD  glucose blood (FREESTYLE TEST STRIPS) test strip Use to check blood sugar daily 03/30/18   [name], MD  lidocaine-prilocaine (EMLA) cream Apply 1 application topically as needed. Patient not taking: Reported on 01/01/2019 03/27/18   Truitt Merle, MD    Current  Facility-Administered Medications  Medication Dose Route Frequency Provider Last Rate Last Dose  . acetaminophen (TYLENOL) tablet 650 mg  650 mg Oral Q6H PRN Debbe Odea, MD       Or  . acetaminophen (TYLENOL) suppository 650 mg  650 mg Rectal Q6H PRN Debbe Odea, MD      . apixaban (ELIQUIS) tablet 5 mg  5 mg Oral BID Rizwan, Saima, MD      . insulin aspart (novoLOG) injection 0-5 Units  0-5 Units Subcutaneous QHS Rizwan, Saima, MD      . insulin aspart (novoLOG) injection 0-9 Units  0-9 Units Subcutaneous TID WC Rizwan, Saima, MD      . ondansetron (ZOFRAN) tablet 4 mg  4 mg Oral Q6H PRN Debbe Odea, MD       Or  . ondansetron (ZOFRAN) injection 4 mg  4 mg Intravenous Q6H PRN Rizwan, Saima, MD      . potassium chloride 10 mEq in 100 mL IVPB  10 mEq Intravenous Q1 Hr x 4 Rizwan, Saima, MD       Current Outpatient Medications  Medication Sig Dispense Refill  . ELIQUIS 5 MG TABS tablet TAKE 1 TABLET BY MOUTH TWICE A DAY (Patient taking differently: Take 5 mg by mouth 2 (two) times daily. ) 60 tablet 3  . JANUMET 50-1000 MG tablet TAKE 1 TABLET TWICE DAILY  WITH MEALS (Patient taking differently: Take 1 tablet by mouth 2 (two) times daily with a meal. ) 180 tablet 3  . Magnesium 500 MG CAPS Take 1 capsule (500 mg total) by mouth 2 (two) times daily. (Patient taking differently: Take 500 mg by mouth 2 (two) times a day. ) 60 capsule 0  . acetaminophen (TYLENOL) 325 MG tablet Take 2 tablets (650 mg total) by mouth every 6 (six) hours as needed. (Patient not taking: Reported on 01/01/2019)    . blood glucose meter kit and supplies Dispense based on patient and insurance preference. Use daily as directed. (E11.9). 1 each 0  . glucose blood (FREESTYLE TEST STRIPS) test strip Use to check blood sugar daily 100 each 4  . lidocaine-prilocaine (EMLA) cream Apply 1 application topically as needed. (Patient not taking: Reported on 01/01/2019) 30 g 2    Allergies as of 01/01/2019 - Review Complete  01/01/2019  Allergen Reaction Noted  . Ciprofloxacin Other (See Comments) 01/30/2018    Family History  Problem Relation Age of Onset  . Lung cancer Mother        smoker  . Brain cancer Mother        metastasis  . AAA (abdominal aortic aneurysm) Father        smoker    Social History   Socioeconomic History  . Marital status: Married    Spouse name: lisa  . Number of children: 0  . Years of education: Not on file  . Highest education level: Not on file  Occupational History  . Not on file  Social Needs  . Financial resource strain:  Not hard at all  . Food insecurity:    Worry: Never true    Inability: Never true  . Transportation needs:    Medical: No    Non-medical: No  Tobacco Use  . Smoking status: Never Smoker  . Smokeless tobacco: Never Used  Substance and Sexual Activity  . Alcohol use: Yes    Comment: occasional  . Drug use: No  . Sexual activity: Yes  Lifestyle  . Physical activity:    Days per week: 0 days    Minutes per session: 0 min  . Stress: Not at all  Relationships  . Social connections:    Talks on phone: More than three times a week    Gets together: More than three times a week    Attends religious service: 1 to 4 times per year    Active member of club or organization: No    Attends meetings of clubs or organizations: Never    Relationship status: Married  . Intimate partner violence:    Fear of current or ex partner: No    Emotionally abused: No    Physically abused: No    Forced sexual activity: No  Other Topics Concern  . Not on file  Social History Narrative   Married 1985. No kids. 4 small dogs.       Works in Financial trader, residential      Hobbies: work on cars, Haematologist, exercise as able    Review of Systems: Pertinent positive and negative review of systems were noted in the above HPI section.  All other review of systems was otherwise negative.  Physical Exam: Vital signs in last 24 hours: Temp:  [98.3 F  (36.8 C)] 98.3 F (36.8 C) (04/20 1046) Pulse Rate:  [88-116] 113 (04/20 1400) Resp:  [12-23] 23 (04/20 1400) BP: (93-118)/(60-79) 113/70 (04/20 1400) SpO2:  [96 %-100 %] 97 % (04/20 1400) Weight:  [90.7 kg] 90.7 kg (04/20 1052)   General:   Alert,  Well-developed, chronically ill appearing WM, pleasant and cooperative in NAD. Mentation slow Head:  Normocephalic and atraumatic. Eyes:  Sclera clear, no icterus.   Conjunctiva pink. Ears:  Normal auditory acuity. Nose:  No deformity, discharge,  or lesions. Mouth:  No deformity or lesions.   Neck:  Supple; no masses or thyromegaly. Lungs:  Clear throughout to auscultation.   No wheezes, crackles, or rhonchi.  Heart:  tachyRegular rate and rhythm; no murmurs, clicks, rubs,  or gallops. Abdomen:  Soft,nontender, BS active,nonpalp mass or hsm.  Ileostomy with brown liquid stool, no obvious blood Rectal:  Deferred  Msk:  Symmetrical without gross deformities. . Pulses:  Normal pulses noted. Extremities:  Without clubbing or edema. Neurologic:  Alert and  oriented x2  grossly normal neurologically. Skin:  Intact without significant lesions or rashes.. Psych:  Alert and cooperative. Mild confusion  Intake/Output from previous day: No intake/output data recorded. Intake/Output this shift: No intake/output data recorded.  Lab Results: Recent Labs    01/01/19 1120  WBC 2.3*  HGB 9.3*  HCT 26.6*  PLT 105*   BMET Recent Labs    01/01/19 1120  NA 120*  K 3.1*  CL 76*  CO2 26  GLUCOSE 218*  BUN 29*  CREATININE 1.07  CALCIUM 8.1*   LFT Recent Labs    01/01/19 1120  PROT 6.3*  ALBUMIN 2.5*  AST 59*  ALT 69*  ALKPHOS 120  BILITOT 1.4*   PT/INR No results for input(s): LABPROT, INR in the  last 72 hours.    IMPRESSION:    #1 63 yo male with Burkitts lymphoma involving bowel , s/p chemotherapy , then loop ileostomy 08/2018 for sigmoid stricture related to lymphoma.  He has just completed radiation .  now with one  week hx of progressive weakness, increased diarrhea , bloody mucoid stool from rectum, poor intake  , new confusion, hyponatremia, hypokalemia  He certainly may have acute radiation enteritis/colitis Will need to r/o infectious etiologies for diarrhea  Ct abd/pelvis has been ordered  #2 transaminitis - etiology not clear - normal in 2/20 #3 pancytopenia- with lymphopenia- etiology not clear - consider atypical presentation of COVID -19 which can cause transaminitis and pancytopenia  #4 AODM  #5 hx DVT/PE   PLAN: Gi Path panel Stool for cdiff  Will discuss Covid 19 testing Follow up Ct abd/pelvis   Diet as tolerates  Volume replacement       Amy EsterwoodPA-C  01/01/2019, 2:15 PM

## 2019-01-01 NOTE — ED Notes (Signed)
Hospitalist bedside 

## 2019-01-01 NOTE — Telephone Encounter (Signed)
Received a call from patient's wife Ching Rabideau. Stated patient has gotten "dramatically worse over the weekend." Per [name], patient cannot walk on his own, has a poor appetite, confusion, and diarrhea. [name] made aware and stated this sounds like radiation side effects and patient should proceed to the Emergency Department. Also made Dr. Lanell Persons nurse Anderson Malta aware of patient symptoms since his last radiation treatment was March 25th. Informed patient's wife that the patient should go the the ED. Patient's wife verbalized understanding and became tearful and stated she wants to know Dr. Lanell Persons opinion. Called and left a message with Dr. Lanell Persons nurse Anderson Malta letting her know that patient's wife would like to hear from Dr. Isidore Moos. Reiterated to patient's wife that Dr. Grier Mitts instructions are to proceed to the ED.

## 2019-01-01 NOTE — Progress Notes (Signed)
After reading Rad Oncology note, I called bedside RN Junie Panning to encourage pt's wife use the elink connection in order to camera in to see and speak with her husband

## 2019-01-01 NOTE — ED Provider Notes (Signed)
[facility] DEPT Provider Note   CSN: [id number] Arrival date & time: 01/01/19  1043    History   Chief Complaint Chief Complaint  Patient presents with  . Weakness  . Diarrhea    HPI Mark Frey is a 63 y.o. male.     The history is provided by the patient and medical records. No language interpreter was used.  Weakness  Associated symptoms: diarrhea   Diarrhea   Mark Frey is a 63 y.o. male who presents to the Emergency Department complaining of weakness diarrhea. He presents to the emergency department complaining of diarrhea and weakness that began about two weeks ago. He has a history of Burkitts lymphoma, status post colostomy, chemotherapy and radiation treatment. Over the last two weeks he has experienced high output from his colostomy as well as pink tinged mucus from his rectum. He denies any fevers, nausea, vomiting, dysuria. He has been taking Imodium to decrease his diarrhea, last dose yesteday. He has experienced progressive generalized weakness over the last several days with some confusion per his wife. He can only walk a few steps before fatiguing. He has had poor appetite but is drinking fluids well. Symptoms are severe, constant, worsening.  No recent antibiotics. Past Medical History:  Diagnosis Date  . ALLERGIC RHINITIS   . Cancer ([place])    Lymphoma   . Diabetes mellitus   . Hyperlipidemia     Patient Active Problem List   Diagnosis Date Noted  . Hyponatremia 01/01/2019  . Pancytopenia ([place]) 01/01/2019  . Abnormal CT of the abdomen   . Protein-calorie malnutrition, severe 08/19/2018  . 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 Diverticulitis of colon   . Low magnesium level 07/30/2018  . Hypokalemia 07/21/2018  . Bowel obstruction in setting of lymphoma and sigmoid stricture 07/21/2018  . Diarrhea 07/21/2018  . Burkitt lymphoma, lymph nodes of multiple sites ([place]) 05/22/2018  . Diabetes mellitus ([place])    . Edema   . Deep vein thrombosis (DVT) of femoral vein of right lower extremity ([place])   . Counseling regarding advance care planning and goals of care 04/25/2018  . Gastrointestinal hemorrhage   . Encounter for antineoplastic chemotherapy   . 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[place]) 04/10/2018  . Port-A-Cath in place 03/27/2018  . Acute deep vein thrombosis (DVT) of femoral vein of right lower extremity ([place])   . 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 Bilateral pulmonary embolism ([place]) 03/10/2018  . Anemia 03/10/2018  . Hypoglycemia 03/10/2018  . Occult blood in stools 03/10/2018  . Abdominal mass 03/10/2018  . Tachycardia 01/31/2017  . Controlled diabetes mellitus type II without complication ([place]) 95/05/3266  . Hypertension 06/05/2014  . Irritable bowel syndrome 03/30/2010  . Morbid obesity ([place]) 12/29/2009  . Dysmetabolic syndrome X 12/45/8099  . BACK PAIN WITH RADICULOPATHY 06/09/2007  . Hyperlipidemia 05/12/2007  . ALLERGIC RHINITIS 05/12/2007    Past Surgical History:  Procedure Laterality Date  . BIOPSY  03/15/2018   Procedure: BIOPSY;  Surgeon: Milus Banister, MD;  Location: [name];  Service: Endoscopy;;  . BOWEL RESECTION N/A 08/25/2018   Procedure: LAPROSCOPIC LOOP ILEOSTOMY;  Surgeon: Greer Pickerel, MD;  Location: [facility];  Service: General;  Laterality: N/A;  . CLEFT PALATE REPAIR    . COLONOSCOPY N/A 03/15/2018   Procedure: COLONOSCOPY;  Surgeon: Milus Banister, MD;  Location: [facility];  Service: Endoscopy;  Laterality: N/A;  . COLONOSCOPY N/A 08/20/2018  Procedure: COLONOSCOPY;  Surgeon: Irene Shipper, MD;  Location: [name];  Service: Endoscopy;  Laterality: N/A;  . deviated septum repair     slight improvement  . ESOPHAGOGASTRODUODENOSCOPY N/A 03/15/2018   Procedure: ESOPHAGOGASTRODUODENOSCOPY (EGD);  Surgeon: Milus Banister, MD;  Location: [name];  Service: Endoscopy;  Laterality: N/A;  . IR IMAGING GUIDED PORT  INSERTION  03/17/2018  . LAPAROSCOPY N/A 08/25/2018   Procedure: LAPAROSCOPY DIAGNOSTIC;  Surgeon: Greer Pickerel, MD;  Location: [facility];  Service: General;  Laterality: N/A;  . TONSILLECTOMY          Home Medications    Prior to Admission medications   Medication Sig Start Date End Date Taking? Authorizing Provider  ELIQUIS 5 MG TABS tablet TAKE 1 TABLET BY MOUTH TWICE A DAY Patient taking differently: Take 5 mg by mouth 2 (two) times daily.  10/01/18  Yes [name], MD  JANUMET 50-1000 MG tablet TAKE 1 TABLET TWICE DAILY  WITH MEALS Patient taking differently: Take 1 tablet by mouth 2 (two) times daily with a meal.  11/29/17  Yes [name], MD  Magnesium 500 MG CAPS Take 1 capsule (500 mg total) by mouth 2 (two) times daily. Patient taking differently: Take 500 mg by mouth 2 (two) times a day.  06/27/18  Yes [name], MD  acetaminophen (TYLENOL) 325 MG tablet Take 2 tablets (650 mg total) by mouth every 6 (six) hours as needed. Patient not taking: Reported on 01/01/2019 08/28/18   Kinnie Feil, MD  blood glucose meter kit and supplies Dispense based on patient and insurance preference. Use daily as directed. (E11.9). 03/31/18   [name], MD  glucose blood (FREESTYLE TEST STRIPS) test strip Use to check blood sugar daily 03/30/18   [name], MD  lidocaine-prilocaine (EMLA) cream Apply 1 application topically as needed. Patient not taking: Reported on 01/01/2019 03/27/18   Truitt Merle, MD    Family History Family History  Problem Relation Age of Onset  . Lung cancer Mother        smoker  . Brain cancer Mother        metastasis  . AAA (abdominal aortic aneurysm) Father        smoker    Social History Social History   Tobacco Use  . Smoking status: Never Smoker  . Smokeless tobacco: Never Used  Substance Use Topics  . Alcohol use: Yes    Comment: occasional  . Drug use: No     Allergies   Ciprofloxacin   Review of Systems  Review of Systems  Gastrointestinal: Positive for diarrhea.  Neurological: Positive for weakness.  All other systems reviewed and are negative.    Physical Exam Updated Vital Signs BP 118/79   Pulse (!) 106   Temp 98.3 F (36.8 C) (Oral)   Resp 12   Ht 5' 9"  (1.753 m)   Wt 90.7 kg Comment: per wife  SpO2 98%   BMI 29.53 kg/m   Physical Exam Vitals signs and nursing note reviewed.  Constitutional:      Appearance: He is well-developed.  HENT:     Head: Normocephalic and atraumatic.  Cardiovascular:     Rate and Rhythm: Regular rhythm.     Comments: tachycardic Pulmonary:     Effort: Pulmonary effort is normal. No respiratory distress.  Abdominal:     Palpations: Abdomen is soft.     Tenderness: There is no abdominal tenderness. There is no guarding or rebound.  Comments: Colostomy in RLQ, brown liquid stool in pouch  Musculoskeletal:        General: No swelling or tenderness.  Skin:    General: Skin is warm and dry.     Capillary Refill: Capillary refill takes less than 2 seconds.  Neurological:     Mental Status: He is alert and oriented to person, place, and time.  Psychiatric:        Mood and Affect: Mood normal.        Behavior: Behavior normal.      ED Treatments / Results  Labs (all labs ordered are listed, but only abnormal results are displayed) Labs Reviewed  COMPREHENSIVE METABOLIC PANEL - Abnormal; Notable for the following components:      Result Value   Sodium 120 (*)    Potassium 3.1 (*)    Chloride 76 (*)    Glucose, Bld 218 (*)    BUN 29 (*)    Calcium 8.1 (*)    Total Protein 6.3 (*)    Albumin 2.5 (*)    AST 59 (*)    ALT 69 (*)    Total Bilirubin 1.4 (*)    Anion gap 18 (*)    All other components within normal limits  CBC WITH DIFFERENTIAL/PLATELET - Abnormal; Notable for the following components:   WBC 2.3 (*)    RBC 2.83 (*)    Hemoglobin 9.3 (*)    HCT 26.6 (*)    Platelets 105 (*)    Lymphs Abs 0.1 (*)    Abs  Immature Granulocytes 0.09 (*)    All other components within normal limits  URINALYSIS, ROUTINE W REFLEX MICROSCOPIC - Abnormal; Notable for the following components:   APPearance HAZY (*)    Protein, ur 30 (*)    Bacteria, UA RARE (*)    All other components within normal limits  GASTROINTESTINAL PANEL BY PCR, STOOL (REPLACES STOOL CULTURE)  MAGNESIUM  VITAMIN B12  FOLATE  IRON AND TIBC  FERRITIN  RETICULOCYTES  COPPER, SERUM    EKG EKG Interpretation  Date/Time:  Monday January 01 2019 10:49:48 EDT Ventricular Rate:  115 PR Interval:    QRS Duration: 91 QT Interval:  352 QTC Calculation: 487 R Axis:   72 Text Interpretation:  Sinus tachycardia Borderline prolonged QT interval Confirmed by Quintella Reichert [phone]) on 01/01/2019 10:52:40 AM   Radiology No results found.  Procedures Procedures (including critical care time)  Medications Ordered in ED Medications  apixaban (ELIQUIS) tablet 5 mg (has no administration in time range)  acetaminophen (TYLENOL) tablet 650 mg (has no administration in time range)    Or  acetaminophen (TYLENOL) suppository 650 mg (has no administration in time range)  ondansetron (ZOFRAN) tablet 4 mg (has no administration in time range)    Or  ondansetron (ZOFRAN) injection 4 mg (has no administration in time range)  insulin aspart (novoLOG) injection 0-9 Units (has no administration in time range)  insulin aspart (novoLOG) injection 0-5 Units (has no administration in time range)  potassium chloride 10 mEq in 100 mL IVPB (has no administration in time range)  sodium chloride 0.9 % bolus 1,000 mL (0 mLs Intravenous Stopped 01/01/19 1202)  lactated ringers bolus 1,000 mL (1,000 mLs Intravenous Bolus from Bag 01/01/19 1205)     Initial Impression / Assessment and Plan / ED Course  I have reviewed the triage vital signs and the nursing notes.  Pertinent labs & imaging results that were available during my care of the  patient were reviewed by  me and considered in my medical decision making (see chart for details).       Patient here for evaluation of progressive weakness. He has generalized weakness on examination. He is tachycardic and dehydrated. Labs demonstrate acute hyponatremia. He was treated with IV fluid hydration. He does have persistent tachycardia after IV fluids, plan to admit for further management.  Final Clinical Impressions(s) / ED Diagnoses   Final diagnoses:  Dehydration  Hyponatremia    ED Discharge Orders    None       Quintella Reichert, MD 01/01/19 1358

## 2019-01-02 DIAGNOSIS — D61818 Other pancytopenia: Secondary | ICD-10-CM

## 2019-01-02 DIAGNOSIS — C8510 Unspecified B-cell lymphoma, unspecified site: Secondary | ICD-10-CM

## 2019-01-02 DIAGNOSIS — K520 Gastroenteritis and colitis due to radiation: Secondary | ICD-10-CM

## 2019-01-02 DIAGNOSIS — D696 Thrombocytopenia, unspecified: Secondary | ICD-10-CM

## 2019-01-02 DIAGNOSIS — E871 Hypo-osmolality and hyponatremia: Secondary | ICD-10-CM

## 2019-01-02 DIAGNOSIS — Z86718 Personal history of other venous thrombosis and embolism: Secondary | ICD-10-CM

## 2019-01-02 DIAGNOSIS — E876 Hypokalemia: Secondary | ICD-10-CM

## 2019-01-02 DIAGNOSIS — Z86711 Personal history of pulmonary embolism: Secondary | ICD-10-CM

## 2019-01-02 DIAGNOSIS — E860 Dehydration: Secondary | ICD-10-CM

## 2019-01-02 LAB — COMPREHENSIVE METABOLIC PANEL
ALT: 53 U/L — ABNORMAL HIGH (ref 0–44)
AST: 36 U/L (ref 15–41)
Albumin: 1.9 g/dL — ABNORMAL LOW (ref 3.5–5.0)
Alkaline Phosphatase: 88 U/L (ref 38–126)
Anion gap: 10 (ref 5–15)
BUN: 16 mg/dL (ref 8–23)
CO2: 27 mmol/L (ref 22–32)
Calcium: 7.5 mg/dL — ABNORMAL LOW (ref 8.9–10.3)
Chloride: 87 mmol/L — ABNORMAL LOW (ref 98–111)
Creatinine, Ser: 0.74 mg/dL (ref 0.61–1.24)
GFR calc Af Amer: >60 mL/min (ref >60)
GFR calc non Af Amer: >60 mL/min (ref >60)
Glucose, Bld: 102 mg/dL — ABNORMAL HIGH (ref 70–99)
Potassium: 3 mmol/L — ABNORMAL LOW (ref 3.5–5.1)
Sodium: 124 mmol/L — ABNORMAL LOW (ref 135–145)
Total Bilirubin: 0.8 mg/dL (ref 0.3–1.2)
Total Protein: 4.8 g/dL — ABNORMAL LOW (ref 6.5–8.1)

## 2019-01-02 LAB — GASTROINTESTINAL PANEL BY PCR, STOOL (REPLACES STOOL CULTURE)

## 2019-01-02 LAB — BASIC METABOLIC PANEL
Anion gap: 8 (ref 5–15)
Anion gap: 10 (ref 5–15)
BUN: 15 mg/dL (ref 8–23)
BUN: 17 mg/dL (ref 8–23)
CO2: 27 mmol/L (ref 22–32)
CO2: 25 mmol/L (ref 22–32)
Calcium: 7.5 mg/dL — ABNORMAL LOW (ref 8.9–10.3)
Calcium: 7.5 mg/dL — ABNORMAL LOW (ref 8.9–10.3)
Chloride: 89 mmol/L — ABNORMAL LOW (ref 98–111)
Chloride: 91 mmol/L — ABNORMAL LOW (ref 98–111)
Creatinine, Ser: 0.72 mg/dL (ref 0.61–1.24)
Creatinine, Ser: 0.75 mg/dL (ref 0.61–1.24)
GFR calc Af Amer: >60 mL/min (ref >60)
GFR calc Af Amer: >60 mL/min (ref >60)
GFR calc non Af Amer: >60 mL/min (ref >60)
GFR calc non Af Amer: >60 mL/min (ref >60)
Glucose, Bld: 101 mg/dL — ABNORMAL HIGH (ref 70–99)
Glucose, Bld: 135 mg/dL — ABNORMAL HIGH (ref 70–99)
Potassium: 3.1 mmol/L — ABNORMAL LOW (ref 3.5–5.1)
Potassium: 3.4 mmol/L — ABNORMAL LOW (ref 3.5–5.1)
Sodium: 124 mmol/L — ABNORMAL LOW (ref 135–145)
Sodium: 126 mmol/L — ABNORMAL LOW (ref 135–145)

## 2019-01-02 LAB — GLUCOSE, CAPILLARY
Glucose-Capillary: 108 mg/dL — ABNORMAL HIGH (ref 70–99)
Glucose-Capillary: 127 mg/dL — ABNORMAL HIGH (ref 70–99)
Glucose-Capillary: 105 mg/dL — ABNORMAL HIGH (ref 70–99)
Glucose-Capillary: 112 mg/dL — ABNORMAL HIGH (ref 70–99)

## 2019-01-02 LAB — C-REACTIVE PROTEIN: CRP: 19.5 mg/dL — ABNORMAL HIGH (ref <1.0)

## 2019-01-02 LAB — SEDIMENTATION RATE: Sed Rate: 136 mm/hr — ABNORMAL HIGH (ref 0–16)

## 2019-01-02 LAB — SODIUM, URINE, RANDOM: Sodium, Ur: <10 mmol/L

## 2019-01-02 LAB — OSMOLALITY: Osmolality: 257 mOsm/kg — ABNORMAL LOW (ref 275–295)

## 2019-01-02 LAB — CBC
HCT: 23.4 % — ABNORMAL LOW (ref 39.0–52.0)
Hemoglobin: 7.9 g/dL — ABNORMAL LOW (ref 13.0–17.0)
MCH: 32.1 pg (ref 26.0–34.0)
MCHC: 33.8 g/dL (ref 30.0–36.0)
MCV: 95.1 fL (ref 80.0–100.0)
Platelets: 79 10*3/uL — ABNORMAL LOW (ref 150–400)
RBC: 2.46 MIL/uL — ABNORMAL LOW (ref 4.22–5.81)
RDW: 14.7 % (ref 11.5–15.5)
WBC: 1.5 10*3/uL — ABNORMAL LOW (ref 4.0–10.5)
nRBC: 0 % (ref 0.0–0.2)

## 2019-01-02 LAB — LACTATE DEHYDROGENASE: LDH: 81 U/L — ABNORMAL LOW (ref 98–192)

## 2019-01-02 LAB — OSMOLALITY, URINE: Osmolality, Ur: 493 mOsm/kg (ref 300–900)

## 2019-01-02 MED ORDER — POTASSIUM CHLORIDE 10 MEQ/100ML IV SOLN
10.0000 meq | INTRAVENOUS | Status: AC
  Administered 2019-01-02 (×3): 10 meq via INTRAVENOUS
  Filled 2019-01-02 (×3): qty 100

## 2019-01-02 MED ORDER — POTASSIUM CHLORIDE CRYS ER 20 MEQ PO TBCR
20.0000 meq | EXTENDED_RELEASE_TABLET | Freq: Once | ORAL | Status: AC
  Administered 2019-01-02: 20 meq via ORAL
  Filled 2019-01-02: qty 1

## 2019-01-02 MED ORDER — B COMPLEX-C PO TABS
1.0000 | ORAL_TABLET | Freq: Every day | ORAL | Status: DC
  Administered 2019-01-02 – 2019-01-16 (×15): 1 via ORAL
  Filled 2019-01-02 (×15): qty 1

## 2019-01-02 MED ORDER — POTASSIUM CHLORIDE CRYS ER 20 MEQ PO TBCR
40.0000 meq | EXTENDED_RELEASE_TABLET | Freq: Once | ORAL | Status: AC
  Administered 2019-01-02: 07:00:00 40 meq via ORAL
  Filled 2019-01-02: qty 2

## 2019-01-02 NOTE — Progress Notes (Signed)
Assisted tele visit to patient with wife.  Feven Alderfer Anderson, RN   

## 2019-01-02 NOTE — Progress Notes (Signed)
Patient ID: Mark Frey, male   DOB: 13-Apr-1956, 63 y.o.   MRN: [id number]    Progress Note   Subjective   Pt chatting with wife by video - did not interrupt Nurse reports no bleeding from rectum overnight, no obvious blood per ileostomy, and pt mentating better  C. difficile quick screen negative Path panel pending  CT abdomen and pelvis shows a diffuse severe colitis diffuse thickening and surrounding edema in the pericolonic fat but no abscess or free fluid.  No evidence for mass or adenopathy.  WBC down to 1.5, hemoglobin down to 7.9, platelets 79,000       Objective   Vital signs in last 24 hours: Temp:  [98.2 F (36.8 C)-99.3 F (37.4 C)] 98.4 F (36.9 C) (04/21 0800) Pulse Rate:  [88-116] 114 (04/21 0904) Resp:  [12-25] 17 (04/21 0904) BP: (93-141)/(40-84) 106/73 (04/21 0904) SpO2:  [81 %-100 %] 96 % (04/21 0904) Weight:  [90.7 kg] 90.7 kg (04/20 1052) Last BM Date: 01/02/19 General:    White male,in NAD chronically ill-appearing Heart:  Regular rate and rhythm; no murmurs- not examined this am   Intake/Output from previous day: 04/20 0701 - 04/21 0700 In: 2020.9 [P.O.:237; I.V.:1383.9; IV Piggyback:400] Out: 1825 [Urine:925; Stool:900] Intake/Output this shift: Total I/O In: 745 [P.O.:240; I.V.:505] Out: -   Lab Results: Recent Labs    01/01/19 1120 01/02/19 0345  WBC 2.3* 1.5*  HGB 9.3* 7.9*  HCT 26.6* 23.4*  PLT 105* 79*   BMET Recent Labs    01/01/19 1708 01/02/19 0345 01/02/19 0825  NA 122* 124* 124*  K 2.7* 3.0* 3.1*  CL 81* 87* 89*  CO2 30 27 27   GLUCOSE 118* 102* 101*  BUN 22 16 15   CREATININE 0.83 0.74 0.72  CALCIUM 7.4* 7.5* 7.5*   LFT Recent Labs    01/02/19 0345  PROT 4.8*  ALBUMIN 1.9*  AST 36  ALT 53*  ALKPHOS 88  BILITOT 0.8   PT/INR No results for input(s): LABPROT, INR in the last 72 hours.  Studies/Results: Dg Chest 2 View  Result Date: 01/01/2019 CLINICAL DATA:  Short of breath.  Recent chemotherapy  for lymphoma EXAM: CHEST - 2 VIEW COMPARISON:  08/24/2018 FINDINGS: Heart size and vascularity normal. Mild linear densities in the right lung base are new and may be atelectasis. No pleural effusion. Port-A-Cath tip SVC. IMPRESSION: Mild right lower lobe atelectasis.  Negative for pneumonia. Electronically Signed   By: Franchot Gallo M.D.   On: 01/01/2019 19:38   Ct Abdomen Pelvis W Contrast  Result Date: 01/01/2019 CLINICAL DATA:  Diarrhea and weakness. History of Burkitt's lymphoma. Recently completed radiation. Ileostomy EXAM: CT ABDOMEN AND PELVIS WITH CONTRAST TECHNIQUE: Multidetector CT imaging of the abdomen and pelvis was performed using the standard protocol following bolus administration of intravenous contrast. CONTRAST:  197m OMNIPAQUE IOHEXOL 300 MG/ML  SOLN COMPARISON:  CT abdomen pelvis 10/05/2018 FINDINGS: Lower chest: Mild scarring in the lung bases. Negative for infiltrate or effusion. Hepatobiliary: Small subcentimeter hypodensities in the liver are unchanged and likely benign cysts. Hypodensity adjacent to the interhemispheric fissure unchanged compatible with fatty infiltration of the liver. Gallbladder contracted and mildly thickened. No biliary dilatation. Pancreas: Negative Spleen: Negative Adrenals/Urinary Tract: 5 mm right lower pole calculus and 2 mm right midpole calculus. No renal obstruction or mass. Negative bladder. Stomach/Bowel: Ileostomy. Marked thickening of the entire colon from cecum to rectum with extensive stranding in the pericolonic fat. No free air or free fluid. Small bowel  nondilated and non thickened. Vascular/Lymphatic: Minimal atherosclerotic disease in the aorta. Negative for lymphadenopathy. Reproductive: Moderate prostate enlargement with numerous calcifications. Other: Negative for abscess Musculoskeletal: Lumbar degenerative changes without acute skeletal abnormality. IMPRESSION: Findings compatible with severe colitis. Diffuse thickening of the colon with  surrounding edema in the pericolonic fat. No abscess or free fluid. No small bowel obstruction. Ileostomy noted in the right lower quadrant. Negative for mass or adenopathy. Electronically Signed   By: Franchot Gallo M.D.   On: 01/01/2019 19:37       Assessment / Plan:     #1 63 yo WM with Burkitts lymphoma dx 6/ 2019 , completed chemo fall 2019 , and has just finished radiation of abdomen with wide field of bowel included.  Admitted with confusion, anorexia, high ostomy output and bloody mucoid stool from rectum.  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 Do not anticipate need for  Endoscopic evaluation this admission  Advance diet as tolerated, low fat, lactose limited   #2 anemia - hgb drifting - may be mutilfactorial - dilution and blood loss  #3 Malnutrition - add supplements, multivitamin #4 hyponatremia- improving  #5 metabolic acidosis- improving -per medicine service #6 leukopenia, thrombocytopenia- ? etiology     #2HGB drifting -          Principal Problem:   Hyponatremia Active Problems:   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HCC)   Hypokalemia   Diarrhea   Pancytopenia ([place])   Hematochezia     LOS: 1 day   Mark Frey  01/02/2019, 9:57 AM

## 2019-01-02 NOTE — Progress Notes (Signed)
HEMATOLOGY/ONCOLOGY INPATIENT PROGRESS NOTE  Date of Service: 01/02/2019  Inpatient Attending: .Oswald Hillock, MD   SUBJECTIVE:   Mark Frey was seen in the ICU at the request of Dr. Wynelle Cleveland and Dr. Darrick Meigs.  He has been admitted with progressive diarrhea with blood and mucus in the stools, poor p.o. intake and change in mental status. He was noted to have severe hyponatremia hypokalemia and hypomagnesemia as well as hypoalbuminemia in the setting of poor p.o. intake and diarrhea. CT abdomen shows no lymphadenopathy to suggest persistent lymphoma and significant severe colitis likely related to his recent radiation therapy.  He was also noted to have some anemia likely from GI bleeding.  Also noted to have some mild to moderate thrombocytopenia and leukopenia(lymphopenia without neutropenia).   He was resting when I visited him today and he was not awoken.  Per nursing notes he is more awake and alert today.  Has been seen by GI for his radiation colitis. GI panel and C. difficile negative.  Pancytopenia slightly more prominent today in the setting of additional hemodilution with fluid resuscitation. Sodium levels improving with IV fluids.   Lab results today (01/02/19) of CBC and BMP is as follows: all values are WNL except for WBC at 1.5k, RBC at 2.46, HGB at 7.9, HCT at 23.4, PLT at 79k, Sodium at 126, Potassium at 3.4, Chloride at 91, Glucose at 135, Calcium at 7.5.     OBJECTIVE:  Resting comfortably in bed  PHYSICAL EXAMINATION: . Vitals:   01/02/19 1200 01/02/19 1300 01/02/19 1350 01/02/19 1400  BP: 104/69 (!) 102/39  (!) 95/50  Pulse: (!) 110 (!) 109 (!) 108 (!) 107  Resp: 16 19 15 20   Temp: 98.4 F (36.9 C)     TempSrc: Oral     SpO2: 97% 95% 96% 97%  Weight:      Height:       Filed Weights   01/01/19 1052  Weight: 200 lb (90.7 kg)   .Body mass index is 29.53 kg/m.  GENERAL: Resting, somewhat pale appearing, in no acute distress. OROPHARYNX: Oral  mucosa dry  As per Dr. Toney Sang examination LUNGS: clear to auscultation HEART: regular rate & rhythm ABDOMEN: Nontender and soft ileostomy in situ Musculoskeletal: no pedal edema PSYCH: alert & oriented x 3 with fluent speech NEURO: no focal motor/sensory deficits  MEDICAL HISTORY:  Past Medical History:  Diagnosis Date  . ALLERGIC RHINITIS   . Cancer ([place])    Lymphoma   . Diabetes mellitus   . Hyperlipidemia     SURGICAL HISTORY: Past Surgical History:  Procedure Laterality Date  . BIOPSY  03/15/2018   Procedure: BIOPSY;  Surgeon: Milus Banister, MD;  Location: [name];  Service: Endoscopy;;  . BOWEL RESECTION N/A 08/25/2018   Procedure: LAPROSCOPIC LOOP ILEOSTOMY;  Surgeon: Greer Pickerel, MD;  Location: [facility];  Service: General;  Laterality: N/A;  . CLEFT PALATE REPAIR    . COLONOSCOPY N/A 03/15/2018   Procedure: COLONOSCOPY;  Surgeon: Milus Banister, MD;  Location: [facility];  Service: Endoscopy;  Laterality: N/A;  . COLONOSCOPY N/A 08/20/2018   Procedure: COLONOSCOPY;  Surgeon: Irene Shipper, MD;  Location: [facility];  Service: Endoscopy;  Laterality: N/A;  . deviated septum repair     slight improvement  . ESOPHAGOGASTRODUODENOSCOPY N/A 03/15/2018   Procedure: ESOPHAGOGASTRODUODENOSCOPY (EGD);  Surgeon: Milus Banister, MD;  Location: [name];  Service: Endoscopy;  Laterality: N/A;  . IR IMAGING GUIDED PORT INSERTION  03/17/2018  .  LAPAROSCOPY N/A 08/25/2018   Procedure: LAPAROSCOPY DIAGNOSTIC;  Surgeon: Greer Pickerel, MD;  Location: [facility];  Service: General;  Laterality: N/A;  . TONSILLECTOMY      SOCIAL HISTORY: Social History   Socioeconomic History  . Marital status: Married    Spouse name: lisa  . Number of children: 0  . Years of education: Not on file  . Highest education level: Not on file  Occupational History  . Not on file  Social Needs  . Financial resource strain: Not hard at all  . Food insecurity:    Worry: Never true     Inability: Never true  . Transportation needs:    Medical: No    Non-medical: No  Tobacco Use  . Smoking status: Never Smoker  . Smokeless tobacco: Never Used  Substance and Sexual Activity  . Alcohol use: Yes    Comment: occasional  . Drug use: No  . Sexual activity: Yes  Lifestyle  . Physical activity:    Days per week: 0 days    Minutes per session: 0 min  . Stress: Not at all  Relationships  . Social connections:    Talks on phone: More than three times a week    Gets together: More than three times a week    Attends religious service: 1 to 4 times per year    Active member of club or organization: No    Attends meetings of clubs or organizations: Never    Relationship status: Married  . Intimate partner violence:    Fear of current or ex partner: No    Emotionally abused: No    Physically abused: No    Forced sexual activity: No  Other Topics Concern  . Not on file  Social History Narrative   Married 1985. No kids. 4 small dogs.       Works in Financial trader, residential      Hobbies: work on cars, Haematologist, exercise as able    FAMILY HISTORY: Family History  Problem Relation Age of Onset  . Lung cancer Mother        smoker  . Brain cancer Mother        metastasis  . AAA (abdominal aortic aneurysm) Father        smoker    ALLERGIES:  is allergic to ciprofloxacin.  MEDICATIONS:  Scheduled Meds: . apixaban  5 mg Oral BID  . Chlorhexidine Gluconate Cloth  6 each Topical Daily  . insulin aspart  0-5 Units Subcutaneous QHS  . insulin aspart  0-9 Units Subcutaneous TID WC   Continuous Infusions: . sodium chloride 125 mL/hr at 01/02/19 0927   PRN Meds:.acetaminophen **OR** acetaminophen, ondansetron **OR** ondansetron (ZOFRAN) IV, sodium chloride flush  REVIEW OF SYSTEMS:    10 Point review of Systems was done is negative except as noted above.   LABORATORY DATA:  I have reviewed the data as listed  . CBC Latest Ref Rng & Units 01/02/2019  01/01/2019 12/06/2018  WBC 4.0 - 10.5 K/uL 1.5(L) 2.3(L) 3.4(L)  Hemoglobin 13.0 - 17.0 g/dL 7.9(L) 9.3(L) 13.2  Hematocrit 39.0 - 52.0 % 23.4(L) 26.6(L) 40.3  Platelets 150 - 400 K/uL 79(L) 105(L) 110(L)    . CMP Latest Ref Rng & Units 01/02/2019 01/02/2019 01/02/2019  Glucose 70 - 99 mg/dL 135(H) 101(H) 102(H)  BUN 8 - 23 mg/dL 17 15 16   Creatinine 0.61 - 1.24 mg/dL 0.75 0.72 0.74  Sodium 135 - 145 mmol/L 126(L) 124(L) 124(L)  Potassium 3.5 -  5.1 mmol/L 3.4(L) 3.1(L) 3.0(L)  Chloride 98 - 111 mmol/L 91(L) 89(L) 87(L)  CO2 22 - 32 mmol/L 25 27 27   Calcium 8.9 - 10.3 mg/dL 7.5(L) 7.5(L) 7.5(L)  Total Protein 6.5 - 8.1 g/dL - - 4.8(L)  Total Bilirubin 0.3 - 1.2 mg/dL - - 0.8  Alkaline Phos 38 - 126 U/L - - 88  AST 15 - 41 U/L - - 36  ALT 0 - 44 U/L - - 53(H)     RADIOGRAPHIC STUDIES: I have personally reviewed the radiological images as listed and agreed with the findings in the report. Dg Chest 2 View  Result Date: 01/01/2019 CLINICAL DATA:  Short of breath.  Recent chemotherapy for lymphoma EXAM: CHEST - 2 VIEW COMPARISON:  08/24/2018 FINDINGS: Heart size and vascularity normal. Mild linear densities in the right lung base are new and may be atelectasis. No pleural effusion. Port-A-Cath tip SVC. IMPRESSION: Mild right lower lobe atelectasis.  Negative for pneumonia. Electronically Signed   By: Franchot Gallo M.D.   On: 01/01/2019 19:38   Ct Abdomen Pelvis W Contrast  Result Date: 01/01/2019 CLINICAL DATA:  Diarrhea and weakness. History of Burkitt's lymphoma. Recently completed radiation. Ileostomy EXAM: CT ABDOMEN AND PELVIS WITH CONTRAST TECHNIQUE: Multidetector CT imaging of the abdomen and pelvis was performed using the standard protocol following bolus administration of intravenous contrast. CONTRAST:  155m OMNIPAQUE IOHEXOL 300 MG/ML  SOLN COMPARISON:  CT abdomen pelvis 10/05/2018 FINDINGS: Lower chest: Mild scarring in the lung bases. Negative for infiltrate or effusion.  Hepatobiliary: Small subcentimeter hypodensities in the liver are unchanged and likely benign cysts. Hypodensity adjacent to the interhemispheric fissure unchanged compatible with fatty infiltration of the liver. Gallbladder contracted and mildly thickened. No biliary dilatation. Pancreas: Negative Spleen: Negative Adrenals/Urinary Tract: 5 mm right lower pole calculus and 2 mm right midpole calculus. No renal obstruction or mass. Negative bladder. Stomach/Bowel: Ileostomy. Marked thickening of the entire colon from cecum to rectum with extensive stranding in the pericolonic fat. No free air or free fluid. Small bowel nondilated and non thickened. Vascular/Lymphatic: Minimal atherosclerotic disease in the aorta. Negative for lymphadenopathy. Reproductive: Moderate prostate enlargement with numerous calcifications. Other: Negative for abscess Musculoskeletal: Lumbar degenerative changes without acute skeletal abnormality. IMPRESSION: Findings compatible with severe colitis. Diffuse thickening of the colon with surrounding edema in the pericolonic fat. No abscess or free fluid. No small bowel obstruction. Ileostomy noted in the right lower quadrant. Negative for mass or adenopathy. Electronically Signed   By: CFranchot GalloM.D.   On: 01/01/2019 19:37    ASSESSMENT & PLAN:  63y.o. male with  1. 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 No BM or CNS involvement.   04/18/18 PET/CT  revealed Continued improvement in right colonic wall thickening and surrounding bulky masses consistent with treated lymphoma. No hypermetabolic activity to suggest residual tumor. There is some hypermetabolic activity associated with the sigmoid colon which demonstrates mild wall thickening, surrounding inflammation and underlying diverticulosis, suggesting mild diverticulitis. Suspected treatment related changes throughout the bone marrow. There is mildly increased  activity within the clivus without clear corresponding finding on the CT images. Attention on follow-up recommended. Nonobstructing right renal calculi. 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   06/09/18 PET/CT revealedSoft tissue lesion within the ventral pelvis is again identified demonstrating mild to moderate increased uptake within SUV max of 5.61. Deauville criteria 4. Concerning for residual metabolically active tumor. 2. Similar appearance of diffusely thickened appendix within the right lower quadrant of the abdomen. Findings may reflect treatment related changes. 3. Increased radiotracer uptake throughout the bone marrow which is favored to represent treatment related changes. 4. Lad coronary artery atherosclerotic calcifications.   S/p 6 cycles of EPOCH-R completed on 07/03/18  10/05/18 CT A/P revealed Persistent wall thickening in the sigmoid colon although a component of this could be due to diverticulosis. There is also a suggestion of wall thickening and potential mucosal enhancement in the terminal ileum, as well as adjacent indistinct stranding in the mesenteric and omental adipose tissues roughly similar to the prior exam. Some of this may be treatment related. Inflammatory process such as Crohn's disease not excluded. 2. No current adenopathy. 3. Other imaging findings of potential clinical significance: Nonobstructive right nephrolithiasis. Aortic Atherosclerosis. Moderate prostatomegaly. Left foraminal impingement at L4-5.   S/p IMRT of 36Gy over 20 fractions, completed 12/06/18.  2.H/oRLE DVT and b/l PE.  Anticoagulation held at this time in the setting of thrombocytopenia and GI bleeding from severe radiation colitis. -May consider prophylactic Lovenox while hospitalized if platelets above 50k and no overt GI bleeding with stable hemoglobin. -SCD for DVT prophylaxis.  3. H/o Small bowel obstruction/ileus with sigmoid thickening/stenosis . CT scan - sigmoid obstructive lesion ? Scar tissue from resolved lymphoma vs residual lymphoma vs post inflammatory scarring from diverticulitis/limited perforation. Also has SBO from adhesions. Colonoscopy 08/21/2018 - no intramucosal lesions. Extrinsic compression of sigmoid colon. 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 Greer Pickerel  4.  Severe colitis likely due to radiation toxicity.  GI panel and C. difficile negative.  Low likelihood of viral infection but cannot rule out. 5.  Dehydration with hypovolemic hyponatremia.  Other electrolyte issues including hypokalemia and hypomagnesemia. 6.  Pancytopenia Anemia likely multifactorial but could be from GI bleeding plus significant inflammation from severe radiation colitis. Leukopenia/lymphopenia likely from radiation toxicity.  Protein-losing enteropathy can also accentuate the lymphopenia. Thrombocytopenia from consumption related to GI bleeding and from radiation toxicity. B12 within normal limits Copper and zinc levels pending Viral work-up ordered and currently pending Low likelihood of Burkitt's lymphoma progression at this time. Plan -IV fluids and electrolyte replacement as per hospital medicine team.  Much appreciate their help -GI has seen the patient and will defer the treatment and supportive cares of his radiation colitis to them. ?  Role for sucralfate/steroid enemas. -If patient febrile would consider empiric antibiotics Cipro plus Flagyl. -Will send out EBV, CMV, parvovirus titers to complete  work-up. -Copper, zinc levels pending. -Empiric B complex. -Nutritional consultation to optimize nutritional status since his colitis with protein losing enteropathy and poor p.o. intake will have a significant bearing on his prognosis and cytopenias. -If poor p.o. intake might need to consider TPN. -Transfuse PRBC PRN for hemoglobin of less than 7.5 or if actively bleeding or if symptomatic. -Transfuse platelets as needed for platelet count of less than 20,000 in the setting of GI ulceration or if actively bleeding and less than 50,000. -We will continue to follow as needed please call if any additional questions arise.   The total time spent in the  appt was 60 minutes and more than 50% was on counseling and direct patient cares.    Sullivan Lone MD MS AAHIVMS [facility] Hematology/Oncology Physician [facility]  (Office):       [phone] (Work cell):  [phone] (Fax):           [phone]  01/02/2019 4:00 PM   I, Baldwin [place], am acting as a scribe for Dr. Sullivan Lone.   .I have reviewed the above documentation for accuracy and completeness, and I agree with the above. Sullivan Lone MD MS

## 2019-01-02 NOTE — Progress Notes (Signed)
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  Patient says that he has been having diarrhea almost a week after he completed radiation treatment.  Patient is passing mucus and liquidy stools per rectum.  Also had been emptying colostomy bag twice a day. GI was consulted.    Subjective   Patient seen and examined, denies abdominal pain.  No nausea or vomiting.  Sodium still 124.  Continues to have mucus stools from rectum.   Assessment/Plan:     1. Hyponatremia-secondary to GI losses, hypovolemic hyponatremia.  Slowly improving, patient is mildly hypotensive with SBP in 90s, getting normal saline at 125 mL/h.  Repeat sodium this morning is again 124.  It has improved from 120, per labs obtained yesterday afternoon.  We will continue with normal saline at above rate and repeat BMP around 2 PM.  Likely hypovolemic hyponatremia from GI losses, will confirm with urine sodium, urine and serum osmolality.  2. Hypokalemia-secondary to GI losses, replace potassium.  Check BMP in a.m.  3. 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   Marked thickening of entire colon from cecum to rectum noted.  GI following.  Will follow the recommendations.  4.  Pancytopenia-patient has been getting Neulasta as outpatient per oncology.  Oncologist [name]  was consulted by my colleague Dr. Wynelle Cleveland.  Today WBC, hemoglobin and platelets are down.  Called and discussed with oncologist on-call Dr. Alvy Bimler, she recommends no Neulasta at this time.  Neulasta only to be  given in case patient developed septic shock.  No prophylactic antibiotics unless patient develops signs and symptoms of infectious process.  5. History of DVT/PE-patient is on anticoagulation with Apixaban.  Discussed with oncologist Dr. Alvy Bimler, she does not feel that patient will benefit from apixaban at this time.  She has asked me to hold apixaban due to dropping platelet count, rectal bleeding.  Only start Lovenox if platelet count is more than 50,000 for DVT prophylaxis.  Today platelet count is 73,000, will discontinue apixaban and start Lovenox.     CBG: Recent Labs  Lab 01/01/19 1623 01/01/19 2136 01/02/19 0751 01/02/19 1120  GLUCAP 122* 106* 108* 127*    CBC: Recent Labs  Lab 01/01/19 1120 01/02/19 0345  WBC 2.3* 1.5*  NEUTROABS 1.9  --   HGB 9.3* 7.9*  HCT 26.6* 23.4*  MCV 94.0 95.1  PLT 105* 79*    Basic Metabolic Panel: Recent Labs  Lab 01/01/19 1120 01/01/19 1708 01/02/19 0345 01/02/19 0825  NA 120* 122* 124* 124*  K 3.1* 2.7* 3.0* 3.1*  CL 76* 81* 87* 89*  CO2 26 30 27 27   GLUCOSE 218* 118* 102* 101*  BUN 29* 22 16 15   CREATININE 1.07 0.83 0.74 0.72  CALCIUM 8.1* 7.4* 7.5* 7.5*  MG 2.0  --   --   --      DVT prophylaxis: Xarelto  Code Status: Full code  Family Communication: No family at bedside  Disposition Plan: likely home when medically ready for discharge     Consultants:  Gastroenterology  Procedures:  None  Antibiotics:   Anti-infectives (From admission, onward)   None       Objective   Vitals:   01/02/19 0900 01/02/19 0904 01/02/19 1000 01/02/19 1100  BP: (!) 97/40 106/73 114/60 (!) 93/50  Pulse: (!) 107 (!) 114 (!) 109 (!) 109  Resp: 16 17 17 20   Temp:      TempSrc:      SpO2: 97% 96% 94% 90%  Weight:      Height:        Intake/Output Summary (Last 24 hours) at 01/02/2019 1207 Last data filed at 01/02/2019 9371 Gross per 24 hour  Intake 2765.88 ml  Output 1825 ml  Net 940.88 ml   Filed Weights    01/01/19 1052  Weight: 90.7 kg     Physical Examination:    General: Appears in no acute distress  Cardiovascular: S1-S2, regular  Respiratory: Clear to auscultation bilaterally  Abdomen: Abdomen is soft, nontender, no organomegaly, ileostomy bag in place  Extremities: No edema of the lower extremities  Neurologic: Alert, oriented x3, no focal deficit noted.     Data Reviewed: I have personally reviewed following labs and imaging studies   Recent Results (from the past 240 hour(s))  Gastrointestinal Panel by PCR , Stool     Status: None   Collection Time: 01/01/19  3:36 PM  Result Value Ref Range Status   Campylobacter species NOT DETECTED NOT DETECTED Final   Plesimonas shigelloides NOT DETECTED NOT DETECTED Final   Salmonella species NOT DETECTED NOT DETECTED Final   Yersinia enterocolitica NOT DETECTED NOT DETECTED Final   Vibrio species NOT DETECTED NOT DETECTED Final   Vibrio cholerae NOT DETECTED NOT DETECTED Final   Enteroaggregative E coli (EAEC) NOT DETECTED NOT DETECTED Final   Enteropathogenic E coli (EPEC) NOT DETECTED NOT DETECTED Final   Enterotoxigenic E coli (ETEC) NOT DETECTED NOT DETECTED Final   Shiga like toxin producing E coli (STEC) NOT DETECTED NOT DETECTED Final   E. coli O157 NOT DETECTED NOT DETECTED Final   Shigella/Enteroinvasive E coli (EIEC) NOT DETECTED NOT DETECTED Final   Cryptosporidium NOT DETECTED NOT DETECTED Final   Cyclospora cayetanensis NOT DETECTED NOT DETECTED Final   Entamoeba histolytica NOT DETECTED NOT DETECTED Final   Giardia lamblia NOT DETECTED NOT DETECTED Final   Adenovirus F40/41 NOT DETECTED NOT DETECTED Final   Astrovirus NOT DETECTED NOT DETECTED Final   Norovirus GI/GII NOT DETECTED NOT DETECTED Final   Rotavirus A NOT DETECTED NOT DETECTED Final   Sapovirus (I, II, IV, and V) NOT DETECTED NOT DETECTED Final    Comment: Performed at [facility], [address] [place], [place] [address]  C  Difficile Quick Screen w PCR reflex     Status: None   Collection Time: 01/01/19  3:41 PM  Result Value Ref Range Status   C Diff antigen NEGATIVE NEGATIVE Final   C Diff toxin NEGATIVE NEGATIVE Final   C Diff interpretation No C. difficile detected.  Final    Comment: Performed at [facility], [place] [address], [place], [place] [address]  MRSA PCR Screening     Status: None   Collection Time: 01/01/19  3:54 PM  Result Value Ref Range Status   MRSA by PCR NEGATIVE NEGATIVE Final    Comment:        The GeneXpert MRSA Assay (FDA approved for NASAL specimens only), is one component of a comprehensive MRSA colonization surveillance program. It is not intended to diagnose MRSA infection  nor to guide or monitor treatment for MRSA infections. Performed at [facility], [place] [address], [place], [place] [address]      Liver Function Tests: Recent Labs  Lab 01/01/19 1120 01/02/19 0345  AST 59* 36  ALT 69* 53*  ALKPHOS 120 88  BILITOT 1.4* 0.8  PROT 6.3* 4.8*  ALBUMIN 2.5* 1.9*      Studies: Dg Chest 2 View  Result Date: 01/01/2019 CLINICAL DATA:  Short of breath.  Recent chemotherapy for lymphoma EXAM: CHEST - 2 VIEW COMPARISON:  08/24/2018 FINDINGS: Heart size and vascularity normal. Mild linear densities in the right lung base are new and may be atelectasis. No pleural effusion. Port-A-Cath tip SVC. IMPRESSION: Mild right lower lobe atelectasis.  Negative for pneumonia. Electronically Signed   By: Franchot Gallo M.D.   On: 01/01/2019 19:38   Ct Abdomen Pelvis W Contrast  Result Date: 01/01/2019 CLINICAL DATA:  Diarrhea and weakness. History of Burkitt's lymphoma. Recently completed radiation. Ileostomy EXAM: CT ABDOMEN AND PELVIS WITH CONTRAST TECHNIQUE: Multidetector CT imaging of the abdomen and pelvis was performed using the standard protocol following bolus administration of intravenous contrast. CONTRAST:  194m OMNIPAQUE IOHEXOL 300  MG/ML  SOLN COMPARISON:  CT abdomen pelvis 10/05/2018 FINDINGS: Lower chest: Mild scarring in the lung bases. Negative for infiltrate or effusion. Hepatobiliary: Small subcentimeter hypodensities in the liver are unchanged and likely benign cysts. Hypodensity adjacent to the interhemispheric fissure unchanged compatible with fatty infiltration of the liver. Gallbladder contracted and mildly thickened. No biliary dilatation. Pancreas: Negative Spleen: Negative Adrenals/Urinary Tract: 5 mm right lower pole calculus and 2 mm right midpole calculus. No renal obstruction or mass. Negative bladder. Stomach/Bowel: Ileostomy. Marked thickening of the entire colon from cecum to rectum with extensive stranding in the pericolonic fat. No free air or free fluid. Small bowel nondilated and non thickened. Vascular/Lymphatic: Minimal atherosclerotic disease in the aorta. Negative for lymphadenopathy. Reproductive: Moderate prostate enlargement with numerous calcifications. Other: Negative for abscess Musculoskeletal: Lumbar degenerative changes without acute skeletal abnormality. IMPRESSION: Findings compatible with severe colitis. Diffuse thickening of the colon with surrounding edema in the pericolonic fat. No abscess or free fluid. No small bowel obstruction. Ileostomy noted in the right lower quadrant. Negative for mass or adenopathy. Electronically Signed   By: CFranchot GalloM.D.   On: 01/01/2019 19:37    Scheduled Meds: . apixaban  5 mg Oral BID  . Chlorhexidine Gluconate Cloth  6 each Topical Daily  . insulin aspart  0-5 Units Subcutaneous QHS  . insulin aspart  0-9 Units Subcutaneous TID WC    Admission status: Inpatient: Based on patients clinical presentation and evaluation of above clinical data, I have made determination that patient meets Inpatient criteria at this time.  Time spent: 25 min  [facility] Pager [phone] If 7PM-7AM, please contact night-coverage at  [email or web address], Office  [phone] password [email or web address]  01/02/2019, 12:07 PM  LOS: 1 day

## 2019-01-03 ENCOUNTER — Inpatient Hospital Stay: Payer: BLUE CROSS/BLUE SHIELD | Admitting: Hematology

## 2019-01-03 ENCOUNTER — Inpatient Hospital Stay: Payer: BLUE CROSS/BLUE SHIELD

## 2019-01-03 LAB — BASIC METABOLIC PANEL
Anion gap: 11 (ref 5–15)
BUN: 14 mg/dL (ref 8–23)
CO2: 21 mmol/L — ABNORMAL LOW (ref 22–32)
Calcium: 7.5 mg/dL — ABNORMAL LOW (ref 8.9–10.3)
Chloride: 97 mmol/L — ABNORMAL LOW (ref 98–111)
Creatinine, Ser: 0.68 mg/dL (ref 0.61–1.24)
GFR calc Af Amer: >60 mL/min (ref >60)
GFR calc non Af Amer: >60 mL/min (ref >60)
Glucose, Bld: 119 mg/dL — ABNORMAL HIGH (ref 70–99)
Potassium: 3.3 mmol/L — ABNORMAL LOW (ref 3.5–5.1)
Sodium: 129 mmol/L — ABNORMAL LOW (ref 135–145)

## 2019-01-03 LAB — CBC
HCT: 23.7 % — ABNORMAL LOW (ref 39.0–52.0)
Hemoglobin: 7.8 g/dL — ABNORMAL LOW (ref 13.0–17.0)
MCH: 32.2 pg (ref 26.0–34.0)
MCHC: 32.9 g/dL (ref 30.0–36.0)
MCV: 97.9 fL (ref 80.0–100.0)
Platelets: 67 10*3/uL — ABNORMAL LOW (ref 150–400)
RBC: 2.42 MIL/uL — ABNORMAL LOW (ref 4.22–5.81)
RDW: 15.1 % (ref 11.5–15.5)
WBC: 1.6 10*3/uL — ABNORMAL LOW (ref 4.0–10.5)
nRBC: 0 % (ref 0.0–0.2)

## 2019-01-03 LAB — CMV DNA, QUANTITATIVE, PCR
CMV DNA Quant: NEGATIVE IU/mL
Log10 CMV Qn DNA Pl: UNDETERMINED log10 IU/mL

## 2019-01-03 LAB — PARVOVIRUS B19 ANTIBODY, IGG AND IGM
Parovirus B19 IgG Abs: 0.1 index (ref 0.0–0.8)
Parovirus B19 IgM Abs: 0.1 index (ref 0.0–0.8)

## 2019-01-03 LAB — GLUCOSE, CAPILLARY
Glucose-Capillary: 116 mg/dL — ABNORMAL HIGH (ref 70–99)
Glucose-Capillary: 135 mg/dL — ABNORMAL HIGH (ref 70–99)
Glucose-Capillary: 155 mg/dL — ABNORMAL HIGH (ref 70–99)
Glucose-Capillary: 117 mg/dL — ABNORMAL HIGH (ref 70–99)

## 2019-01-03 LAB — COPPER, SERUM: Copper: 132 ug/dL (ref 72–166)

## 2019-01-03 LAB — EPSTEIN-BARR VIRUS (EBV) ANTIBODY PROFILE
EBV NA IgG: <18 U/mL (ref 0.0–17.9)
EBV VCA IgG: 32.5 U/mL — ABNORMAL HIGH (ref 0.0–17.9)
EBV VCA IgM: <36 U/mL (ref 0.0–35.9)

## 2019-01-03 LAB — CMV ANTIBODY, IGG (EIA): CMV Ab - IgG: <0.6 U/mL (ref 0.00–0.59)

## 2019-01-03 LAB — CMV IGM: CMV IgM: <30 AU/mL (ref 0.0–29.9)

## 2019-01-03 MED ORDER — ENOXAPARIN SODIUM 40 MG/0.4ML ~~LOC~~ SOLN
40.0000 mg | SUBCUTANEOUS | Status: DC
  Filled 2019-01-03: qty 0.4

## 2019-01-03 MED ORDER — ENSURE ENLIVE PO LIQD
237.0000 mL | Freq: Two times a day (BID) | ORAL | Status: DC
  Administered 2019-01-03 – 2019-01-08 (×9): 237 mL via ORAL

## 2019-01-03 MED ORDER — NON FORMULARY: 60.0000 mL | Freq: Two times a day (BID) | Status: DC

## 2019-01-03 MED ORDER — NONFORMULARY OR COMPOUNDED ITEM
60.0000 mL | Freq: Every day | Status: DC
  Administered 2019-01-03 – 2019-01-04 (×2): 60 mL

## 2019-01-03 MED ORDER — POTASSIUM CHLORIDE CRYS ER 20 MEQ PO TBCR
40.0000 meq | EXTENDED_RELEASE_TABLET | Freq: Once | ORAL | Status: AC
  Administered 2019-01-03: 40 meq via ORAL
  Filled 2019-01-03: qty 2

## 2019-01-03 MED ORDER — NONFORMULARY OR COMPOUNDED ITEM
60.0000 mL | Freq: Two times a day (BID) | Status: DC
  Administered 2019-01-03 – 2019-01-04 (×3): 60 mL via RECTAL

## 2019-01-03 NOTE — Progress Notes (Signed)
Patient ID: Mark Frey, male   DOB: 1956/01/28, 63 y.o.   MRN: [id number]    Progress Note   Subjective   Pt feeling better, stronger . Says he ate very well at breakfast today  and starting to have more of an appetite. No real c/o abdominal  Pain  Stool from ileostomy thickening up Still passing some liquids stool from rectum.  WBC 1.6, hgb 7.8, plts 67  CMV serology negative EBV pending Parvovirus pending   Objective   Vital signs in last 24 hours: Temp:  [97.8 F (36.6 C)-98.4 F (36.9 C)] 97.8 F (36.6 C) (04/22 0423) Pulse Rate:  [100-114] 109 (04/22 0423) Resp:  [14-24] 16 (04/22 0423) BP: (92-117)/(39-69) 108/57 (04/22 0423) SpO2:  [90 %-99 %] 95 % (04/22 0423) Weight:  [86.3 kg] 86.3 kg (04/21 2050) Last BM Date: 01/02/19 General:    White male in NAD chronically ill appearing , mentation improved  Heart:  Regular rate and rhythm; no murmurs Lungs: Respirations even and unlabored, lungs CTA bilaterally Abdomen:  Soft, nontender and nondistended. Normal bowel sounds. Ileostomy with brown loose stool - not watery , no obvious blood Extremities:  Without edema. Neurologic:  Alert and oriented,  grossly normal neurologically. Psych:  Cooperative. Normal mood and affect.  Intake/Output from previous day: 04/21 0701 - 04/22 0700 In: 4160.9 [P.O.:1280; I.V.:2575.3; IV Piggyback:305.7] Out: 1635 [Urine:1075; Stool:560] Intake/Output this shift: No intake/output data recorded.  Lab Results: Recent Labs    01/01/19 1120 01/02/19 0345 01/03/19 0840  WBC 2.3* 1.5* 1.6*  HGB 9.3* 7.9* 7.8*  HCT 26.6* 23.4* 23.7*  PLT 105* 79* 67*   BMET Recent Labs    01/02/19 0825 01/02/19 1400 01/03/19 0840  NA 124* 126* 129*  K 3.1* 3.4* 3.3*  CL 89* 91* 97*  CO2 27 25 21*  GLUCOSE 101* 135* 119*  BUN 15 17 14   CREATININE 0.72 0.75 0.68  CALCIUM 7.5* 7.5* 7.5*   LFT Recent Labs    01/02/19 0345  PROT 4.8*  ALBUMIN 1.9*  AST 36  ALT 53*  ALKPHOS 88   BILITOT 0.8   PT/INR No results for input(s): LABPROT, INR in the last 72 hours.  Studies/Results: Dg Chest 2 View  Result Date: 01/01/2019 CLINICAL DATA:  Short of breath.  Recent chemotherapy for lymphoma EXAM: CHEST - 2 VIEW COMPARISON:  08/24/2018 FINDINGS: Heart size and vascularity normal. Mild linear densities in the right lung base are new and may be atelectasis. No pleural effusion. Port-A-Cath tip SVC. IMPRESSION: Mild right lower lobe atelectasis.  Negative for pneumonia. Electronically Signed   By: Mark Frey M.D.   On: 01/01/2019 19:38   Ct Abdomen Pelvis W Contrast  Result Date: 01/01/2019 CLINICAL DATA:  Diarrhea and weakness. History of Burkitt's lymphoma. Recently completed radiation. Ileostomy EXAM: CT ABDOMEN AND PELVIS WITH CONTRAST TECHNIQUE: Multidetector CT imaging of the abdomen and pelvis was performed using the standard protocol following bolus administration of intravenous contrast. CONTRAST:  158m OMNIPAQUE IOHEXOL 300 MG/ML  SOLN COMPARISON:  CT abdomen pelvis 10/05/2018 FINDINGS: Lower chest: Mild scarring in the lung bases. Negative for infiltrate or effusion. Hepatobiliary: Small subcentimeter hypodensities in the liver are unchanged and likely benign cysts. Hypodensity adjacent to the interhemispheric fissure unchanged compatible with fatty infiltration of the liver. Gallbladder contracted and mildly thickened. No biliary dilatation. Pancreas: Negative Spleen: Negative Adrenals/Urinary Tract: 5 mm right lower pole calculus and 2 mm right midpole calculus. No renal obstruction or mass. Negative bladder. Stomach/Bowel: Ileostomy.  Marked thickening of the entire colon from cecum to rectum with extensive stranding in the pericolonic fat. No free air or free fluid. Small bowel nondilated and non thickened. Vascular/Lymphatic: Minimal atherosclerotic disease in the aorta. Negative for lymphadenopathy. Reproductive: Moderate prostate enlargement with numerous  calcifications. Other: Negative for abscess Musculoskeletal: Lumbar degenerative changes without acute skeletal abnormality. IMPRESSION: Findings compatible with severe colitis. Diffuse thickening of the colon with surrounding edema in the pericolonic fat. No abscess or free fluid. No small bowel obstruction. Ileostomy noted in the right lower quadrant. Negative for mass or adenopathy. Electronically Signed   By: Mark Frey M.D.   On: 01/01/2019 19:37       Assessment / Plan:    #62  63 year old white male with Burkitt's lymphoma status post chemotherapy, and just completed radiation therapy to the abdomen with wide feet of bowel included ,patient admitted with confusion, weakness anorexia, high ostomy output and bloody mucoid stool from the rectum.  Patient is status post loop ileostomy December 2019 for extrinsic sigmoid stricture, abdominal mass.  Infectious work-up negative CT scan is consistent with an acute diffuse colitis most consistent with radiation colitis.  #2 Pancytopenia-counts continue to drift.  Per oncology this is felt secondary to acute radiation toxicity  #3 malnutrition #4 metabolic acidosis-resolved #5 hyponatremia and hypokalemia-improving with continued correction #6 adult onset diabetes mellitus   Plan; Regular carb modified diet, Glucerna supplements between meals  Will initiate treatment of radiation colitis with short chain fatty acids.  Plan is for or chain fatty acid enemas twice daily per rectum, and once daily instillation of 60 ml he had the medial aspect of his ileostomy. General course of treatment is around 6 weeks, if he has significant improvement over the next couple of weeks, we can reassess duration of regimen.  Dr.Cirigliano spoke with patient's wife per phone while we were in the room today and she has been updated.           Principal Problem:   Hyponatremia Active Problems:   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HCC)   Hypokalemia    Diarrhea   Pancytopenia (HCC)   Hematochezia   Radiation gastroenteritis   Radiation colitis     LOS: 2 days   Mark Frey  01/03/2019, 9:12 AM

## 2019-01-03 NOTE — Progress Notes (Signed)
Initial Nutrition Assessment  DOCUMENTATION CODES:   Non-severe (moderate) malnutrition in context of chronic illness  INTERVENTION:  - will order Ensure Enlive BID, each supplement provides 350 kcal and 20 grams of protein. - continue to encourage PO intakes. - will continue to monitor for additional nutrition-related needs.    NUTRITION DIAGNOSIS:   Moderate Malnutrition related to chronic illness, catabolic illness, cancer and cancer related treatments as evidenced by mild fat depletion, mild muscle depletion, moderate muscle depletion.  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 He presented to the ED with complaints of diarrhea for 2-3 weeks and severe weakness. 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 GI, oncology following.  BMI indicates overweight status. Per RN flow sheet, patient consumed 50% of lunch yesterday (235 kcal, 12 grams protein) and 50% of breakfast this AM (265 kcal, 11 grams protein). Patient seemed to have some mild confusion and forgetfulness during RD visit stating things such as he has not been home in over 2 weeks and needing questions repeated because he would begin to answer and then forget what the question was.   Patient reports that diarrhea has been ongoing x2-3 weeks. He also reports decreased appetite but is unable to recall when this began; he does feel that appetite is improving. He confirms taste alterations since beginning cancer treatment (unable to give specifics other than  that food tastes different and that nothing sounds good to him). Patient denies any difficulty or pain with chewing or swallowing.  Patient does not recall if he was drinking any protein shakes PTA but is very interested in receiving them during hospitalization. Patient is unsure of UBW other than it is over 200 lb. Per chart review, current weight is 190 lb and weight on 10/30/18 was 221 lb. This indicates 31 lb weight loss (14% body weight) in the past 2 months. Suspect some of weight loss is fluid related given ongoing diarrhea. Per review of weight hx, it appears that weight was 210 lb on 09/21/18 and trended up 11 lb over the course of a month and a half.   Talked with patient about consuming adequate fluids throughout the day and eating as well as he can. Patient states understanding the importance of eating and drinking enough and that he will continue to try his best.    Medications reviewed; 1 tablet B-complex with vitamin C/day, sliding scale novolog, 20 mEq K-Dur x1 dose 4/21, 40 mEq K-Dur x1 dose 4/22. Labs reviewed; CBGs: 116 and 135 mg/dl today, Na: 129 mmol/l, K: 3.3 mmol/l, Cl: 97 mmol/l, Ca: 7.5 mg/dl.  IVF; NS @ 100 ml/hr.       NUTRITION - FOCUSED PHYSICAL EXAM:    Most Recent Value  Orbital Region  Mild depletion  Upper Arm Region  Moderate depletion  Thoracic and Lumbar Region  Unable to assess  Buccal Region  Mild depletion  Temple Region  Mild depletion  Clavicle Bone Region  Mild depletion  Clavicle and Acromion Bone Region  Moderate depletion  Scapular Bone Region  Unable to assess  Dorsal Hand  Mild depletion  Patellar Region  Mild depletion  Anterior Thigh Region  Unable to assess  Posterior Calf Region  Mild depletion  Edema (RD Assessment)  None  Hair  Reviewed  Eyes  Reviewed  Mouth  Reviewed  Skin  Reviewed  Nails  Reviewed       Diet Order:   Diet Order            Diet Carb Modified Fluid consistency: Thin; Room service appropriate? Yes; Fluid  restriction: 1500 mL Fluid  Diet effective now              EDUCATION NEEDS:   Not appropriate for education at this time  Skin:  Skin Assessment: Reviewed RN Assessment  Last BM:  4/22  Height:   Ht Readings from Last 1 Encounters:  01/02/19 5' 9"  (1.753 m)    Weight:   Wt Readings from Last 1 Encounters:  01/02/19 86.3 kg    Ideal Body Weight:  72.73 kg  BMI:  Body mass index is 28.1 kg/m.  Estimated Nutritional Needs:   Kcal:  2400-2600 kcal  Protein:  120-130 grams  Fluid:  >/= 2.5 L/day     Jarome Matin, MS, RD, LDN, [facility] Inpatient Clinical Dietitian Pager # [phone] After hours/weekend pager # [phone]

## 2019-01-03 NOTE — Progress Notes (Signed)
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  GI, oncology following.  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  Showed marked thickening of the entire colon from cecum to rectum.  GI following.  Planning for initiation of radiation colitis with short chain fatty acids. He denies any abdominal pain today.  Ate his food.  Passing semisolid stool in the ostomy bag. GI pathogen panel, C. difficile negative.  Concern for viral colitis.  CMV , EBV PCR, parvovirus Ab pending.  Dehydration: Continue gentle IV fluids.  Hyponatremia: Improving.  Sodium of 129 today.  Continue gentle IV fluids.  Hypokalemia: Supplemented with potassium.  Pancytopenia: Was on Neulasta as an outpatient as per oncology.  Oncology following.  Has anemia and thrombocytopenia.  Will transfuse if hemoglobin is less than 7.5 or platelets count less than 20,000.  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  Eliquis stopped due to thrombocytopenia and rectal bleeding.Will resume when appropriate.          DVT prophylaxis: Lovenox Code Status: Full Family Communication: None present at the bedside Disposition Plan: Undetermined at this point.   Consultants: GI, oncology  Procedures: None  Antimicrobials:  Anti-infectives (From admission,  onward)   None      Subjective: Patient seen and examined at bedside this morning.  Appears comfortable.  Denies any abdominal pain, nausea or vomiting.  Passing semisolid stool in the ileostomy bag.  Appetite is good.  Objective: Vitals:   01/02/19 1950 01/02/19 2050 01/03/19 0423 01/03/19 1240  BP: 111/60 117/68 (!) 108/57 108/63  Pulse: (!) 114 (!) 104 (!) 109 (!) 110  Resp: (!) 22 20 16 19   Temp:  98.3 F (36.8 C) 97.8 F (36.6 C) 99.9 F (37.7 C)  TempSrc:  Oral Oral Oral  SpO2: 94% 97% 95% 97%  Weight:  86.3 kg    Height:  5' 9"  (1.753 m)      Intake/Output Summary (Last 24 hours) at 01/03/2019 1323 Last data filed at 01/03/2019 1200 Gross per 24 hour  Intake 3335.91 ml  Output 2235 ml  Net 1100.91 ml   Filed Weights   01/01/19 1052 01/02/19 2050  Weight: 90.7 kg 86.3 kg    Examination:  General exam: Appears calm and comfortable ,Not in distress,average built HEENT:PERRL,Oral mucosa moist, Ear/Nose normal on gross exam Respiratory system: Bilateral equal air entry, normal vesicular breath sounds, no wheezes or crackles  Cardiovascular system: S1 & S2 heard, RRR. No JVD, murmurs, rubs, gallops or clicks. No pedal edema.  Port-A-Cath on the right chest Gastrointestinal system: Abdomen is nondistended, soft and nontender. No organomegaly or masses felt. Normal bowel sounds heard. ileostomy filled with brown semisolid stool. Central nervous system: Alert and oriented. No focal neurological deficits. Extremities: No edema, no clubbing ,no cyanosis, distal peripheral pulses palpable. Skin: No rashes, lesions  or ulcers,no icterus ,no pallor   Data Reviewed: I have personally reviewed following labs and imaging studies  CBC: Recent Labs  Lab 01/01/19 1120 01/02/19 0345 01/03/19 0840  WBC 2.3* 1.5* 1.6*  NEUTROABS 1.9  --   --   HGB 9.3* 7.9* 7.8*  HCT 26.6* 23.4* 23.7*  MCV 94.0 95.1 97.9  PLT 105* 79* 67*   Basic Metabolic Panel: Recent Labs  Lab  01/01/19 1120 01/01/19 1708 01/02/19 0345 01/02/19 0825 01/02/19 1400 01/03/19 0840  NA 120* 122* 124* 124* 126* 129*  K 3.1* 2.7* 3.0* 3.1* 3.4* 3.3*  CL 76* 81* 87* 89* 91* 97*  CO2 26 30 27 27 25  21*  GLUCOSE 218* 118* 102* 101* 135* 119*  BUN 29* 22 16 15 17 14   CREATININE 1.07 0.83 0.74 0.72 0.75 0.68  CALCIUM 8.1* 7.4* 7.5* 7.5* 7.5* 7.5*  MG 2.0  --   --   --   --   --    GFR: Estimated Creatinine Clearance: 104.1 mL/min (by C-G formula based on SCr of 0.68 mg/dL). Liver Function Tests: Recent Labs  Lab 01/01/19 1120 01/02/19 0345  AST 59* 36  ALT 69* 53*  ALKPHOS 120 88  BILITOT 1.4* 0.8  PROT 6.3* 4.8*  ALBUMIN 2.5* 1.9*   No results for input(s): LIPASE, AMYLASE in the last 168 hours. No results for input(s): AMMONIA in the last 168 hours. Coagulation Profile: No results for input(s): INR, PROTIME in the last 168 hours. Cardiac Enzymes: No results for input(s): CKTOTAL, CKMB, CKMBINDEX, TROPONINI in the last 168 hours. BNP (last 3 results) No results for input(s): PROBNP in the last 8760 hours. HbA1C: No results for input(s): HGBA1C in the last 72 hours. CBG: Recent Labs  Lab 01/02/19 1120 01/02/19 1639 01/02/19 2307 01/03/19 0740 01/03/19 1203  GLUCAP 127* 105* 112* 116* 135*   Lipid Profile: No results for input(s): CHOL, HDL, LDLCALC, TRIG, CHOLHDL, LDLDIRECT in the last 72 hours. Thyroid Function Tests: No results for input(s): TSH, T4TOTAL, FREET4, T3FREE, THYROIDAB in the last 72 hours. Anemia Panel: Recent Labs    01/01/19 1043  VITAMINB12 959*  FOLATE 14.0  FERRITIN 3,518*  TIBC 191*  IRON 58  RETICCTPCT 1.4   Sepsis Labs: No results for input(s): PROCALCITON, LATICACIDVEN in the last 168 hours.  Recent Results (from the past 240 hour(s))  Gastrointestinal Panel by PCR , Stool     Status: None   Collection Time: 01/01/19  3:36 PM  Result Value Ref Range Status   Campylobacter species NOT DETECTED NOT DETECTED Final    Plesimonas shigelloides NOT DETECTED NOT DETECTED Final   Salmonella species NOT DETECTED NOT DETECTED Final   Yersinia enterocolitica NOT DETECTED NOT DETECTED Final   Vibrio species NOT DETECTED NOT DETECTED Final   Vibrio cholerae NOT DETECTED NOT DETECTED Final   Enteroaggregative E coli (EAEC) NOT DETECTED NOT DETECTED Final   Enteropathogenic E coli (EPEC) NOT DETECTED NOT DETECTED Final   Enterotoxigenic E coli (ETEC) NOT DETECTED NOT DETECTED Final   Shiga like toxin producing E coli (STEC) NOT DETECTED NOT DETECTED Final   E. coli O157 NOT DETECTED NOT DETECTED Final   Shigella/Enteroinvasive E coli (EIEC) NOT DETECTED NOT DETECTED Final   Cryptosporidium NOT DETECTED NOT DETECTED Final   Cyclospora cayetanensis NOT DETECTED NOT DETECTED Final   Entamoeba histolytica NOT DETECTED NOT DETECTED Final   Giardia lamblia NOT DETECTED NOT DETECTED Final   Adenovirus F40/41 NOT DETECTED NOT DETECTED Final  Astrovirus NOT DETECTED NOT DETECTED Final   Norovirus GI/GII NOT DETECTED NOT DETECTED Final   Rotavirus A NOT DETECTED NOT DETECTED Final   Sapovirus (I, II, IV, and V) NOT DETECTED NOT DETECTED Final    Comment: Performed at [facility], [place], [place] [address]  C Difficile Quick Screen w PCR reflex     Status: None   Collection Time: 01/01/19  3:41 PM  Result Value Ref Range Status   C Diff antigen NEGATIVE NEGATIVE Final   C Diff toxin NEGATIVE NEGATIVE Final   C Diff interpretation No C. difficile detected.  Final    Comment: Performed at [facility], [place] [address] [place], [place] [address]  MRSA PCR Screening     Status: None   Collection Time: 01/01/19  3:54 PM  Result Value Ref Range Status   MRSA by PCR NEGATIVE NEGATIVE Final    Comment:        The GeneXpert MRSA Assay (FDA approved for NASAL specimens only), is one component of a comprehensive MRSA colonization surveillance program. It is not intended to  diagnose MRSA infection nor to guide or monitor treatment for MRSA infections. Performed at [facility], [place] [address] [place], [place] [address]          Radiology Studies: Dg Chest 2 View  Result Date: 01/01/2019 CLINICAL DATA:  Short of breath.  Recent chemotherapy for lymphoma EXAM: CHEST - 2 VIEW COMPARISON:  08/24/2018 FINDINGS: Heart size and vascularity normal. Mild linear densities in the right lung base are new and may be atelectasis. No pleural effusion. Port-A-Cath tip SVC. IMPRESSION: Mild right lower lobe atelectasis.  Negative for pneumonia. Electronically Signed   By: Franchot Gallo M.D.   On: 01/01/2019 19:38   Ct Abdomen Pelvis W Contrast  Result Date: 01/01/2019 CLINICAL DATA:  Diarrhea and weakness. History of Burkitt's lymphoma. Recently completed radiation. Ileostomy EXAM: CT ABDOMEN AND PELVIS WITH CONTRAST TECHNIQUE: Multidetector CT imaging of the abdomen and pelvis was performed using the standard protocol following bolus administration of intravenous contrast. CONTRAST:  125m OMNIPAQUE IOHEXOL 300 MG/ML  SOLN COMPARISON:  CT abdomen pelvis 10/05/2018 FINDINGS: Lower chest: Mild scarring in the lung bases. Negative for infiltrate or effusion. Hepatobiliary: Small subcentimeter hypodensities in the liver are unchanged and likely benign cysts. Hypodensity adjacent to the interhemispheric fissure unchanged compatible with fatty infiltration of the liver. Gallbladder contracted and mildly thickened. No biliary dilatation. Pancreas: Negative Spleen: Negative Adrenals/Urinary Tract: 5 mm right lower pole calculus and 2 mm right midpole calculus. No renal obstruction or mass. Negative bladder. Stomach/Bowel: Ileostomy. Marked thickening of the entire colon from cecum to rectum with extensive stranding in the pericolonic fat. No free air or free fluid. Small bowel nondilated and non thickened. Vascular/Lymphatic: Minimal atherosclerotic disease in the  aorta. Negative for lymphadenopathy. Reproductive: Moderate prostate enlargement with numerous calcifications. Other: Negative for abscess Musculoskeletal: Lumbar degenerative changes without acute skeletal abnormality. IMPRESSION: Findings compatible with severe colitis. Diffuse thickening of the colon with surrounding edema in the pericolonic fat. No abscess or free fluid. No small bowel obstruction. Ileostomy noted in the right lower quadrant. Negative for mass or adenopathy. Electronically Signed   By: CFranchot GalloM.D.   On: 01/01/2019 19:37        Scheduled Meds:  B-complex with vitamin C  1 tablet Oral Daily   Chlorhexidine Gluconate Cloth  6 each Topical Daily   enoxaparin (LOVENOX) injection  40 mg  Subcutaneous Q24H   insulin aspart  0-5 Units Subcutaneous QHS   insulin aspart  0-9 Units Subcutaneous TID WC   Continuous Infusions:  sodium chloride 125 mL/hr at 01/03/19 0927     LOS: 2 days    Time spent: More than 50% of that time was spent in counseling and/or coordination of care.      [name], MD [facility] Pager [phone]  If 7PM-7AM, please contact night-coverage [email or web address] Password [email or web address] 01/03/2019, 1:23 PM

## 2019-01-04 ENCOUNTER — Telehealth: Payer: Self-pay | Admitting: *Deleted

## 2019-01-04 DIAGNOSIS — E440 Moderate protein-calorie malnutrition: Secondary | ICD-10-CM

## 2019-01-04 LAB — GLUCOSE, CAPILLARY
Glucose-Capillary: 109 mg/dL — ABNORMAL HIGH (ref 70–99)
Glucose-Capillary: 207 mg/dL — ABNORMAL HIGH (ref 70–99)
Glucose-Capillary: 229 mg/dL — ABNORMAL HIGH (ref 70–99)
Glucose-Capillary: 154 mg/dL — ABNORMAL HIGH (ref 70–99)

## 2019-01-04 LAB — BASIC METABOLIC PANEL
Anion gap: 7 (ref 5–15)
BUN: 11 mg/dL (ref 8–23)
CO2: 23 mmol/L (ref 22–32)
Calcium: 7.4 mg/dL — ABNORMAL LOW (ref 8.9–10.3)
Chloride: 100 mmol/L (ref 98–111)
Creatinine, Ser: 0.61 mg/dL (ref 0.61–1.24)
GFR calc Af Amer: >60 mL/min (ref >60)
GFR calc non Af Amer: >60 mL/min (ref >60)
Glucose, Bld: 106 mg/dL — ABNORMAL HIGH (ref 70–99)
Potassium: 3.3 mmol/L — ABNORMAL LOW (ref 3.5–5.1)
Sodium: 130 mmol/L — ABNORMAL LOW (ref 135–145)

## 2019-01-04 LAB — CBC WITH DIFFERENTIAL/PLATELET
Basophils Absolute: 0 10*3/uL (ref 0.0–0.1)
Basophils Relative: 0 %
Eosinophils Absolute: 0 10*3/uL (ref 0.0–0.5)
Eosinophils Relative: 1 %
HCT: 20.8 % — ABNORMAL LOW (ref 39.0–52.0)
Hemoglobin: 6.8 g/dL — CL (ref 13.0–17.0)
Lymphocytes Relative: 9 %
Lymphs Abs: 0.1 10*3/uL — ABNORMAL LOW (ref 0.7–4.0)
MCH: 32.4 pg (ref 26.0–34.0)
MCHC: 32.7 g/dL (ref 30.0–36.0)
MCV: 99 fL (ref 80.0–100.0)
Monocytes Absolute: 0.1 10*3/uL (ref 0.1–1.0)
Monocytes Relative: 14 %
Neutro Abs: 0.8 10*3/uL — ABNORMAL LOW (ref 1.7–7.7)
Neutrophils Relative %: 76 %
Platelets: 51 10*3/uL — ABNORMAL LOW (ref 150–400)
RBC: 2.1 MIL/uL — ABNORMAL LOW (ref 4.22–5.81)
RDW: 15.2 % (ref 11.5–15.5)
WBC: 1 10*3/uL — CL (ref 4.0–10.5)
nRBC: 0 % (ref 0.0–0.2)
nRBC: 0 /100 WBC

## 2019-01-04 LAB — PREPARE RBC (CROSSMATCH)

## 2019-01-04 LAB — ZINC: Zinc: 44 ug/dL — ABNORMAL LOW (ref 56–134)

## 2019-01-04 MED ORDER — VANCOMYCIN HCL 10 G IV SOLR
1750.0000 mg | Freq: Once | INTRAVENOUS | Status: AC
  Administered 2019-01-04: 1750 mg via INTRAVENOUS
  Filled 2019-01-04: qty 1750

## 2019-01-04 MED ORDER — VANCOMYCIN HCL 10 G IV SOLR
1250.0000 mg | Freq: Two times a day (BID) | INTRAVENOUS | Status: DC
  Administered 2019-01-04 – 2019-01-06 (×5): 1250 mg via INTRAVENOUS
  Filled 2019-01-04 (×6): qty 1250

## 2019-01-04 MED ORDER — ZINC SULFATE 220 (50 ZN) MG PO CAPS
220.0000 mg | ORAL_CAPSULE | Freq: Two times a day (BID) | ORAL | Status: DC
  Administered 2019-01-04 – 2019-01-16 (×24): 220 mg via ORAL
  Filled 2019-01-04 (×25): qty 1

## 2019-01-04 MED ORDER — TBO-FILGRASTIM 300 MCG/0.5ML ~~LOC~~ SOSY
300.0000 ug | PREFILLED_SYRINGE | Freq: Every day | SUBCUTANEOUS | Status: DC
  Administered 2019-01-04 – 2019-01-07 (×4): 300 ug via SUBCUTANEOUS
  Filled 2019-01-04 (×4): qty 0.5

## 2019-01-04 MED ORDER — POTASSIUM CHLORIDE CRYS ER 20 MEQ PO TBCR
40.0000 meq | EXTENDED_RELEASE_TABLET | Freq: Once | ORAL | Status: AC
  Administered 2019-01-04: 12:00:00 40 meq via ORAL
  Filled 2019-01-04: qty 2

## 2019-01-04 MED ORDER — SODIUM CHLORIDE 0.9% IV SOLUTION: Freq: Once | INTRAVENOUS | Status: DC

## 2019-01-04 MED ORDER — SODIUM CHLORIDE 0.9 % IV SOLN
2.0000 g | Freq: Three times a day (TID) | INTRAVENOUS | Status: AC
  Administered 2019-01-04 – 2019-01-10 (×21): 2 g via INTRAVENOUS
  Filled 2019-01-04 (×21): qty 2

## 2019-01-04 NOTE — Progress Notes (Signed)
Critical labs WBC 1, Hgb 6.8. Paged on call.

## 2019-01-04 NOTE — Progress Notes (Signed)
[facility] ROUNDING NOTE   Subjective: T-max one 1.5 overnight with T-current 100.0.  Still with tachycardia with heart rate 119-1 28.  Started on cefepime and vancomycin for fever and blood Cultures drawn and pending. Otherwise no complaints.  Started on short-chain fatty acid solution via enema and ostomy yesterday.  Tolerated well.  Decreased rectal output in last 24 hours.  Improve consistency of stool from ostomy.  Labs notable for WBC 1.0 with H/H6 0.8/20.8 and platelets 51.  Order for 1 unit PRBCs today.  Sodium up to 130.   Objective: Vital signs in last 24 hours: Temp:  [97.7 F (36.5 C)-101.5 F (38.6 C)] 98.2 F (36.8 C) (04/23 1309) Pulse Rate:  [108-128] 114 (04/23 1309) Resp:  [14-24] 20 (04/23 1309) BP: (98-117)/(51-70) 99/58 (04/23 1309) SpO2:  [93 %-99 %] 99 % (04/23 1309) Last BM Date: 01/04/19(per ostomy) General: NAD.  Well conversive. Neuro: Improved mentation and conversing well. Abdomen: Soft, nontender, nondistended.  Ostomy in place Ext: No c/c/e    Intake/Output from previous day: 04/22 0701 - 04/23 0700 In: 2672.5 [P.O.:1240; I.V.:1372.5] Out: 2050 [Urine:1175; Stool:875] Intake/Output this shift: Total I/O In: 3047.4 [P.O.:1077; I.V.:935.4; Blood:335; IV Piggyback:700] Out: 1300 [Urine:650; Stool:650]   Lab Results: Recent Labs    01/02/19 0345 01/03/19 0840 01/04/19 0451  WBC 1.5* 1.6* 1.0*  HGB 7.9* 7.8* 6.8*  PLT 79* 67* 51*  MCV 95.1 97.9 99.0   BMET Recent Labs    01/02/19 1400 01/03/19 0840 01/04/19 0451  NA 126* 129* 130*  K 3.4* 3.3* 3.3*  CL 91* 97* 100  CO2 25 21* 23  GLUCOSE 135* 119* 106*  BUN 17 14 11   CREATININE 0.75 0.68 0.61  CALCIUM 7.5* 7.5* 7.4*   LFT Recent Labs    01/02/19 0345  PROT 4.8*  ALBUMIN 1.9*  AST 36  ALT 53*  ALKPHOS 88  BILITOT 0.8   PT/INR No results for input(s): INR in the last 72 hours.    Imaging/Other results: No results found.    Assessment and  Recommendations:  63 year old white male with Burkitt's lymphoma status post chemotherapy, and just completed radiation therapy to the abdomen with wide feet of bowel included ,patient admitted with confusion, weakness anorexia, high ostomy output and bloody mucoid stool from the rectum.  Patient is status post loop ileostomy December 2019 for extrinsic sigmoid stricture, abdominal mass.  Infectious work-up negative.  CMV, EBV, parvovirus all negative. CT scan is consistent with an acute diffuse colitis most consistent with radiation colitis.  1) Radiation colitis: -Continue short-chain fatty acid solution.  60 cc rectally twice daily and another 60 cc in the medial aspect of his ileostomy -Continue supportive care as you are doing - P.o. intake improving - No plan for colonoscopy still  2) Pancytopenia: -Transfusing 1 unit PRBCs today -Continue to trend closely -Hematology previously consulted.  No Neulasta recommended at that juncture.  Defer to Hematology and Hospitalist service regarding further treatment of pancytopenia  3) Fever: New fever overnight without change in clinical status.  Started on broad-spectrum antibiotics.  Cultures drawn and pending. - Following culture sensitivity/specificity -Antimicrobial therapy per primary Hospitalist service - Placed on neutropenic precautions -Was on Neulasta as outpatient  4) Malnutrition: P.o. intake improving slowly -Replace zinc   5) Hyponatremia: Improving   Lavena Bullion, DO  01/04/2019, 3:19 PM [facility] Pager [phone]

## 2019-01-04 NOTE — Progress Notes (Signed)
Pt educated on the importance of adhering to the 1500 ml fluid restriction. Pt constantly asking for drinks and ice crea,m. RN continues to educate on the importance of the fluid restriciton due to low sodium levels. Also, edcuated the patient on the importance of staying mobile and sitting in char. Pt continues to refuse when RN offers the chair. Pt currently has a stage 2 on buttocks that was present on admission. Educated the pt on the importance of keeping pressure off to prevent further skin breakdown. Pt verbalized understanding.

## 2019-01-04 NOTE — Progress Notes (Signed)
   01/04/19 0807  Vitals  Temp (!) 101.5 F (38.6 C)  Temp Source Oral  BP (!) 114/58  MAP (mmHg) 75  BP Method Automatic  Pulse Rate (!) 119  Pulse Rate Source Monitor  Resp (!) 22  Oxygen Therapy  SpO2 96 %  O2 Device Room Air   MD notified of MEWS score of 5. Pt given tylenol and red MEWS started. New orders for IV abx started.

## 2019-01-04 NOTE — Progress Notes (Signed)
MEWS 3: Temperature 101, HR 116. Paged MD.

## 2019-01-04 NOTE — Plan of Care (Signed)
  Problem: Health Behavior/Discharge Planning: Goal: Ability to manage health-related needs will improve Outcome: Progressing   Problem: Clinical Measurements: Goal: Will remain free from infection Outcome: Progressing Goal: Respiratory complications will improve Outcome: Progressing Goal: Cardiovascular complication will be avoided Outcome: Progressing   Problem: Coping: Goal: Level of anxiety will decrease Outcome: Progressing   Problem: Elimination: Goal: Will not experience complications related to bowel motility Outcome: Progressing Goal: Will not experience complications related to urinary retention Outcome: Progressing   Problem: Safety: Goal: Ability to remain free from injury will improve Outcome: Progressing   Problem: Skin Integrity: Goal: Risk for impaired skin integrity will decrease Outcome: Progressing   Problem: Clinical Measurements: Goal: Ability to maintain clinical measurements within normal limits will improve Outcome: Not Progressing Goal: Diagnostic test results will improve Outcome: Not Progressing   Problem: Activity: Goal: Risk for activity intolerance will decrease Outcome: Not Progressing   Problem: Nutrition: Goal: Adequate nutrition will be maintained Outcome: Not Progressing

## 2019-01-04 NOTE — Progress Notes (Signed)
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  GI, oncology following.  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  Showed marked thickening of the entire colon from cecum to rectum.  GI following.  Initiated  Therapy for  radiation colitis with short chain fatty acids enema. He denies any abdominal pain today.  Ate his food.  Passing semisolid stool in the ostomy bag.  Stool from rectum is scant. GI pathogen panel, C. difficile negative.  Concern for viral colitis.  CMV , EBV PCR negative , parvovirus Ab negative.EBV PCR pending.  Fever: Became febrile and tachycardic this morning.  Will get blood cultures.  Will start on broad-spectrum antibiotics with vancomycin and cefepime.  Initiate neutropenic precaution.  Dehydration: Continue gentle IV fluids.  Hyponatremia: Improving.  Sodium of 130 today.  Continue gentle IV fluids.  Hypokalemia: Supplemented with potassium.  Pancytopenia: ANC of 760 today.  Was on Neulasta as an outpatient as per oncology.  Oncology following.  Has severe anemia and thrombocytopenia.  Hemoglobin dropped in the range of 6 today.  Being transfused with 1 unit  of PRBC.Platelets of 51.  Will transfuse with platelets if is less than 20,000.  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  Eliquis  stopped due to thrombocytopenia .Will resume when appropriate.   Nutrition Problem: Moderate Malnutrition Etiology: chronic illness, catabolic illness, cancer and cancer related treatments      DVT prophylaxis:SCD Code Status: Full Family Communication: None present at the bedside Disposition Plan: Undetermined at this point.   Consultants: GI, oncology  Procedures: None  Antimicrobials:  Anti-infectives (From admission, onward)   Start     Dose/Rate Route Frequency Ordered Stop   01/04/19 2200  vancomycin (VANCOCIN) 1,250 mg in sodium chloride 0.9 % 250 mL IVPB     1,250 mg 166.7 mL/hr over 90 Minutes Intravenous Every 12 hours 01/04/19 0819     01/04/19 0900  vancomycin (VANCOCIN) 1,750 mg in sodium chloride 0.9 % 500 mL IVPB     1,750 mg 250 mL/hr over 120 Minutes Intravenous  Once 01/04/19 0819     01/04/19 0830  ceFEPIme (MAXIPIME) 2 g in sodium chloride 0.9 % 100 mL IVPB     2 g 200 mL/hr over 30 Minutes Intravenous Every 8 hours 01/04/19 0815        Subjective: Patient seen and examined the bedside this morning.  Became febrile and tachycardic.  On evaluation at the bedside, he was comfortable.  Denies any abdominal pain.  Having semisolid stool output in the ileostomy bag.  Tolerating diet.  Objective: Vitals:   01/04/19 0915 01/04/19 0942 01/04/19 1030 01/04/19 1058  BP: 110/70 (!) 98/56 (!) 117/51 (!) 105/59  Pulse:  (!) 121 (!) 115 (!) 108  Resp: 20 (!) 24 (!) 22 20  Temp: 99.8 F (37.7 C)  100 F (37.8 C) 97.7 F (36.5 C) 99.8 F (37.7 C)  TempSrc: Oral Oral Oral Axillary  SpO2: 96% 94% 95% 96%  Weight:      Height:        Intake/Output Summary (Last 24 hours) at 01/04/2019 1127 Last data filed at 01/04/2019 1102 Gross per 24 hour  Intake 3717.5 ml  Output 2150 ml  Net 1567.5 ml   Filed Weights   01/01/19 1052 01/02/19 2050  Weight: 90.7 kg 86.3 kg    Examination:   General exam: Appears calm and comfortable ,Not in distress,average built  HEENT:PERRL,Oral mucosa moist, Ear/Nose normal on gross exam Respiratory system: Bilateral equal air entry, normal vesicular breath sounds, no wheezes or crackles  Cardiovascular system: S1 & S2 heard, RRR. No JVD, murmurs, rubs, gallops or clicks.  Port-A-Cath on the right chest Gastrointestinal system: Abdomen is nondistended, soft and nontender. No organomegaly or masses felt. Normal bowel sounds heard. Ileostomy bag filled with semisolid brown stool. Central nervous system: Alert and oriented. No focal neurological deficits. Extremities: No edema, no clubbing ,no cyanosis, distal peripheral pulses palpable. Skin: No rashes, lesions or ulcers,no icterus ,no pallor MSK: Normal muscle bulk,tone ,power Psychiatry: Judgement and insight appear normal. Mood & affect appropriate.    Data Reviewed: I have personally reviewed following labs and imaging studies  CBC: Recent Labs  Lab 01/01/19 1120 01/02/19 0345 01/03/19 0840 01/04/19 0451  WBC 2.3* 1.5* 1.6* 1.0*  NEUTROABS 1.9  --   --  0.8*  HGB 9.3* 7.9* 7.8* 6.8*  HCT 26.6* 23.4* 23.7* 20.8*  MCV 94.0 95.1 97.9 99.0  PLT 105* 79* 67* 51*   Basic Metabolic Panel: Recent Labs  Lab 01/01/19 1120  01/02/19 0345 01/02/19 0825 01/02/19 1400 01/03/19 0840 01/04/19 0451  NA 120*   < > 124* 124* 126* 129* 130*  K 3.1*   < > 3.0* 3.1* 3.4* 3.3* 3.3*  CL 76*   < > 87* 89* 91* 97* 100  CO2 26   < > 27 27 25  21* 23  GLUCOSE 218*   < > 102* 101* 135* 119* 106*  BUN 29*   < > 16 15 17 14 11   CREATININE 1.07   < > 0.74 0.72 0.75 0.68 0.61  CALCIUM 8.1*   < > 7.5* 7.5* 7.5* 7.5* 7.4*  MG 2.0  --   --   --   --   --   --    < > = values in this interval not displayed.   GFR: Estimated Creatinine Clearance: 104.1 mL/min (by C-G formula based on SCr of 0.61 mg/dL). Liver Function Tests: Recent Labs  Lab 01/01/19 1120 01/02/19 0345  AST 59* 36  ALT 69* 53*  ALKPHOS 120 88  BILITOT 1.4* 0.8  PROT 6.3* 4.8*  ALBUMIN 2.5* 1.9*    No results for input(s): LIPASE, AMYLASE in the last 168 hours. No results for input(s): AMMONIA in the last 168 hours. Coagulation Profile: No results for input(s): INR, PROTIME in the last 168 hours. Cardiac Enzymes: No results for input(s): CKTOTAL, CKMB, CKMBINDEX, TROPONINI in the last 168 hours. BNP (last 3 results) No results for input(s): PROBNP in the last 8760 hours. HbA1C: No results for input(s): HGBA1C in the last 72 hours. CBG: Recent Labs  Lab 01/03/19 0740 01/03/19 1203 01/03/19 1616 01/03/19 1950 01/04/19 0733  GLUCAP 116* 135* 155* 117* 109*   Lipid Profile: No results for input(s): CHOL, HDL, LDLCALC, TRIG, CHOLHDL, LDLDIRECT in the last  72 hours. Thyroid Function Tests: No results for input(s): TSH, T4TOTAL, FREET4, T3FREE, THYROIDAB in the last 72 hours. Anemia Panel: No results for input(s): VITAMINB12, FOLATE, FERRITIN, TIBC, IRON, RETICCTPCT in the last 72 hours. Sepsis Labs: No results for input(s): PROCALCITON, LATICACIDVEN in the last 168 hours.  Recent Results (from the past 240 hour(s))  Gastrointestinal Panel by PCR , Stool     Status: None   Collection Time: 01/01/19  3:36 PM  Result Value Ref Range Status   Campylobacter species NOT DETECTED NOT DETECTED Final   Plesimonas shigelloides NOT DETECTED NOT DETECTED Final   Salmonella species NOT DETECTED NOT DETECTED Final   Yersinia enterocolitica NOT DETECTED NOT DETECTED Final   Vibrio species NOT DETECTED NOT DETECTED Final   Vibrio cholerae NOT DETECTED NOT DETECTED Final   Enteroaggregative E coli (EAEC) NOT DETECTED NOT DETECTED Final   Enteropathogenic E coli (EPEC) NOT DETECTED NOT DETECTED Final   Enterotoxigenic E coli (ETEC) NOT DETECTED NOT DETECTED Final   Shiga like toxin producing E coli (STEC) NOT DETECTED NOT DETECTED Final   E. coli O157 NOT DETECTED NOT DETECTED Final   Shigella/Enteroinvasive E coli (EIEC) NOT DETECTED NOT DETECTED Final   Cryptosporidium NOT DETECTED NOT  DETECTED Final   Cyclospora cayetanensis NOT DETECTED NOT DETECTED Final   Entamoeba histolytica NOT DETECTED NOT DETECTED Final   Giardia lamblia NOT DETECTED NOT DETECTED Final   Adenovirus F40/41 NOT DETECTED NOT DETECTED Final   Astrovirus NOT DETECTED NOT DETECTED Final   Norovirus GI/GII NOT DETECTED NOT DETECTED Final   Rotavirus A NOT DETECTED NOT DETECTED Final   Sapovirus (I, II, IV, and V) NOT DETECTED NOT DETECTED Final    Comment: Performed at [facility], [address] [place], [place] [address]  C Difficile Quick Screen w PCR reflex     Status: None   Collection Time: 01/01/19  3:41 PM  Result Value Ref Range Status   C Diff antigen NEGATIVE NEGATIVE Final   C Diff toxin NEGATIVE NEGATIVE Final   C Diff interpretation No C. difficile detected.  Final    Comment: Performed at [facility], [place] [address], [place], [place] [address]  MRSA PCR Screening     Status: None   Collection Time: 01/01/19  3:54 PM  Result Value Ref Range Status   MRSA by PCR NEGATIVE NEGATIVE Final    Comment:        The GeneXpert MRSA Assay (FDA approved for NASAL specimens only), is one component of a comprehensive MRSA colonization surveillance program. It is not intended to diagnose MRSA infection nor to guide or monitor treatment for MRSA infections. Performed at [facility], [place] [address], [place], [place] [address]          Radiology Studies: No results found.      Scheduled Meds: . sodium chloride   Intravenous Once  . B-complex with vitamin C  1 tablet Oral Daily  . Chlorhexidine Gluconate Cloth  6 each Topical Daily  . feeding supplement (ENSURE ENLIVE)  237 mL Oral BID BM  . insulin aspart  0-5 Units Subcutaneous QHS  . insulin aspart  0-9 Units Subcutaneous TID WC  . SHORT CHAIN FATTY ACID ENEMA  60 mL Rectal BID  . SHORT CHAIN FATTY ACID ENEMA  60 mL Irrigation Daily   Continuous Infusions: . sodium chloride  100 mL/hr at 01/04/19 0700  . ceFEPime (MAXIPIME) IV 2 g (01/04/19 0847)  . vancomycin  1,750 mg (01/04/19 0931)   Followed by  . vancomycin       LOS: 3 days    Time spent: 25  mins.More than 50% of that time was spent in counseling and/or coordination of care.      [name], MD [facility] Pager [phone]  If 7PM-7AM, please contact night-coverage [email or web address] Password [facility] 01/04/2019, 11:27 AM

## 2019-01-04 NOTE — Progress Notes (Signed)
Pharmacy Antibiotic Note  Mark Frey is a 63 y.o. male with PMH Burkitt's Lymphoma s/p chemo/rads and diverting loop ileostomy, admitted on 01/01/2019 with increased ostomy output and blood/mucus from rectum d/t radiation colitis. On 4/23, patient noted to have new fevers as well as low WBC and ANC 800. Pharmacy has been consulted for vancomycin and cefepime dosing for sepsis. Viral w/u pending.  Plan:  Vancomycin 1750 mg IV now, then 1250 mg IV q12 hr (est AUC 483 based on SCr rounded to 0.8, Vt = 13)  Measure vancomycin AUC at steady state as indicated  Cefepime 2 g IV q8 hr  SCr q48 hr while on vancomycin  Height: 5' 9"  (175.3 cm) Weight: 190 lb 4.1 oz (86.3 kg) IBW/kg (Calculated) : 70.7  Temp (24hrs), Avg:100.3 F (37.9 C), Min:99.5 F (37.5 C), Max:101.5 F (38.6 C)  Recent Labs  Lab 01/01/19 1120  01/02/19 0345 01/02/19 0825 01/02/19 1400 01/03/19 0840 01/04/19 0451  WBC 2.3*  --  1.5*  --   --  1.6* 1.0*  CREATININE 1.07   < > 0.74 0.72 0.75 0.68 0.61   < > = values in this interval not displayed.    Estimated Creatinine Clearance: 104.1 mL/min (by C-G formula based on SCr of 0.61 mg/dL).    Allergies  Allergen Reactions  . Ciprofloxacin Other (See Comments)    Leg tingling    Antimicrobials this admission: 4/23 Vanc >>  4/23 Cefepime >>   Dose adjustments this admission: n/a  Microbiology results: 4/20 C.diff: neg 4/20 GI panel: neg 4/20 MRSA PCR: neg 4/23 BCx: ordered   Thank you for allowing pharmacy to be a part of this patient's care.  Reuel Boom, PharmD, BCPS [phone] 01/04/2019, 9:25 AM

## 2019-01-04 NOTE — Telephone Encounter (Signed)
Ms. Nall asked if Dr.Kale would call her when he next visits her husband in the hospital. [name] given message and number.

## 2019-01-05 DIAGNOSIS — E60 Dietary zinc deficiency: Secondary | ICD-10-CM

## 2019-01-05 DIAGNOSIS — E440 Moderate protein-calorie malnutrition: Secondary | ICD-10-CM

## 2019-01-05 LAB — HEMOGLOBIN AND HEMATOCRIT, BLOOD
HCT: 23.3 % — ABNORMAL LOW (ref 39.0–52.0)
Hemoglobin: 7.7 g/dL — ABNORMAL LOW (ref 13.0–17.0)

## 2019-01-05 LAB — EPSTEIN BARR VRS(EBV DNA BY PCR)
EBV DNA QN by PCR: NEGATIVE copies/mL
log10 EBV DNA Qn PCR: UNDETERMINED log10 copy/mL

## 2019-01-05 LAB — CBC WITH DIFFERENTIAL/PLATELET
Abs Immature Granulocytes: 0.01 10*3/uL (ref 0.00–0.07)
Basophils Absolute: 0 10*3/uL (ref 0.0–0.1)
Basophils Relative: 1 %
Eosinophils Absolute: 0 10*3/uL (ref 0.0–0.5)
Eosinophils Relative: 1 %
HCT: 21.2 % — ABNORMAL LOW (ref 39.0–52.0)
Hemoglobin: 6.9 g/dL — CL (ref 13.0–17.0)
Immature Granulocytes: 1 %
Lymphocytes Relative: 13 %
Lymphs Abs: 0.1 10*3/uL — ABNORMAL LOW (ref 0.7–4.0)
MCH: 30.8 pg (ref 26.0–34.0)
MCHC: 32.5 g/dL (ref 30.0–36.0)
MCV: 94.6 fL (ref 80.0–100.0)
Monocytes Absolute: 0.1 10*3/uL (ref 0.1–1.0)
Monocytes Relative: 12 %
Neutro Abs: 0.8 10*3/uL — ABNORMAL LOW (ref 1.7–7.7)
Neutrophils Relative %: 72 %
Platelets: 47 10*3/uL — ABNORMAL LOW (ref 150–400)
RBC: 2.24 MIL/uL — ABNORMAL LOW (ref 4.22–5.81)
RDW: 20.2 % — ABNORMAL HIGH (ref 11.5–15.5)
WBC: 1.1 10*3/uL — CL (ref 4.0–10.5)
nRBC: 0 % (ref 0.0–0.2)

## 2019-01-05 LAB — GLUCOSE, CAPILLARY
Glucose-Capillary: 105 mg/dL — ABNORMAL HIGH (ref 70–99)
Glucose-Capillary: 168 mg/dL — ABNORMAL HIGH (ref 70–99)
Glucose-Capillary: 168 mg/dL — ABNORMAL HIGH (ref 70–99)
Glucose-Capillary: 172 mg/dL — ABNORMAL HIGH (ref 70–99)

## 2019-01-05 LAB — BASIC METABOLIC PANEL
Anion gap: 7 (ref 5–15)
BUN: 8 mg/dL (ref 8–23)
CO2: 21 mmol/L — ABNORMAL LOW (ref 22–32)
Calcium: 7.2 mg/dL — ABNORMAL LOW (ref 8.9–10.3)
Chloride: 103 mmol/L (ref 98–111)
Creatinine, Ser: 0.53 mg/dL — ABNORMAL LOW (ref 0.61–1.24)
GFR calc Af Amer: >60 mL/min (ref >60)
GFR calc non Af Amer: >60 mL/min (ref >60)
Glucose, Bld: 110 mg/dL — ABNORMAL HIGH (ref 70–99)
Potassium: 3 mmol/L — ABNORMAL LOW (ref 3.5–5.1)
Sodium: 131 mmol/L — ABNORMAL LOW (ref 135–145)

## 2019-01-05 LAB — PREPARE RBC (CROSSMATCH)

## 2019-01-05 LAB — MAGNESIUM: Magnesium: 1.2 mg/dL — ABNORMAL LOW (ref 1.7–2.4)

## 2019-01-05 LAB — IMMATURE PLATELET FRACTION: Immature Platelet Fraction: 3.7 % (ref 1.2–8.6)

## 2019-01-05 LAB — PHOSPHORUS: Phosphorus: 2 mg/dL — ABNORMAL LOW (ref 2.5–4.6)

## 2019-01-05 MED ORDER — SODIUM CHLORIDE 0.9% IV SOLUTION
Freq: Once | INTRAVENOUS | Status: AC
  Administered 2019-01-05: 13:00:00 via INTRAVENOUS

## 2019-01-05 MED ORDER — MAGNESIUM SULFATE 2 GM/50ML IV SOLN
2.0000 g | Freq: Once | INTRAVENOUS | Status: AC
  Administered 2019-01-05: 2 g via INTRAVENOUS
  Filled 2019-01-05: qty 50

## 2019-01-05 MED ORDER — POTASSIUM CHLORIDE CRYS ER 20 MEQ PO TBCR
40.0000 meq | EXTENDED_RELEASE_TABLET | ORAL | Status: AC
  Administered 2019-01-05 (×2): 40 meq via ORAL
  Filled 2019-01-05 (×2): qty 2

## 2019-01-05 NOTE — Progress Notes (Signed)
Patient ID: Mark Frey, male   DOB: [birth year], 63 y.o.   MRN: [id number]    Progress Note   Subjective   Patient says he feels okay, appetite is good and he is eating well.  Denies any nausea. He is uncertain how many times he passed stool through the rectum during the night but says he has not been seeing any blood. Admits to lower abdominal discomfort/pain  Patient and wife report he is to start Neulasta today per [name]  T-max 102 Blood cultures pending  WBC 1.1, hemoglobin 6.9 platelets 47, absolute lymphs 0.1     Objective   Vital signs in last 24 hours: Temp:  [97.7 F (36.5 C)-102.5 F (39.2 C)] 100.3 F (37.9 C) (04/24 0621) Pulse Rate:  [96-118] 118 (04/24 0621) Resp:  [18-25] 20 (04/24 0621) BP: (99-122)/(51-64) 117/64 (04/24 0621) SpO2:  [92 %-99 %] 92 % (04/24 0621) Last BM Date: 01/05/19 General: Chronically ill-appearing older   White male in NAD Heart: Tachy regular rate and rhythm; no murmurs Lungs: Respirations even and unlabored, lungs CTA bilaterally Abdomen:  Soft, tender left mid and left lower quadrant, no rebound and nondistended. Normal bowel sounds. Extremities:  Without edema. Neurologic:  Alert and oriented,  grossly normal neurologically.  Mentation improved Psych:  Cooperative. Normal mood and affect.  Intake/Output from previous day: 04/23 0701 - 04/24 0700 In: 6827.8 [P.O.:2877; I.V.:2828.1; Blood:335; IV Piggyback:787.8] Out: 4076 [Urine:1875; KGSUP:1031] Intake/Output this shift: Total I/O In: -  Out: 400 [Urine:100; Stool:300]  Lab Results: Recent Labs    01/03/19 0840 01/04/19 0451 01/05/19 0314  WBC 1.6* 1.0* 1.1*  HGB 7.8* 6.8* 6.9*  HCT 23.7* 20.8* 21.2*  PLT 67* 51* 47*   BMET Recent Labs    01/03/19 0840 01/04/19 0451 01/05/19 0314  NA 129* 130* 131*  K 3.3* 3.3* 3.0*  CL 97* 100 103  CO2 21* 23 21*  GLUCOSE 119* 106* 110*  BUN 14 11 8   CREATININE 0.68 0.61 0.53*  CALCIUM 7.5* 7.4* 7.2*   LFT No  results for input(s): PROT, ALBUMIN, AST, ALT, ALKPHOS, BILITOT, BILIDIR, IBILI in the last 72 hours. PT/INR No results for input(s): LABPROT, INR in the last 72 hours.     Assessment / Plan:    #7 63 year old white male with Burkitt's lymphoma, status post chemotherapy and just having completed radiation therapy to the abdomen which included a wide field of bowel Patient admitted with confusion, progressive weakness, anorexia, increased ostomy output and bloody mucoid stool from the rectum  Patient status post loop ileostomy December 2019 for 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  We will continue short-chain fatty acid dilution via the medial portion of ileostomy once daily  Patient has new left lower quadrant tenderness-if this persists will need repeat CT scan to rule out perforation/abscess  #2 pancytopenia-patient transfused 1 unit yesterday no change in hemoglobin Per oncology he is to start Neulasta  #3 fever-on broad-spectrum antibiotics, source unclear.  This may be secondary to acute colitis, however with neutropenia also concern for bacteremia. Blood cultures pending  #4 hyponatremia-improved and stable.   Principal Problem:   Hyponatremia Active Problems:  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HCC)   Hypokalemia   Diarrhea   Pancytopenia (HCC)   Hematochezia   Radiation gastroenteritis   Radiation colitis   Malnutrition of moderate degree     LOS: 4 days   Amy EsterwoodPA-C  01/05/2019, 10:16 AM

## 2019-01-05 NOTE — Progress Notes (Signed)
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  GI, oncology following.  Has been febrile since last 24 hours, concerning for neutropenic fever.  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  Showed marked thickening of the entire colon from cecum to rectum.  GI following.  Started therapy for  radiation colitis with short chain fatty acids enema but has been stopped now due to concern for neutropenic fever. He denies any abdominal pain today.  Has mild generalized abdominal tenderness.  Tolerating diet.  Passing loose stool in the ostomy bag.  Stool from rectum is scant. GI pathogen panel, C. difficile negative.  There was also concern for viral colitis.  CMV , EBV PCR negative , parvovirus Ab negative.EBV PCR pending.  Fever: Became febrile and tachycardic since yesterday.  Low grade fever this morning.Will follow up blood cultures. Started  on broad-spectrum antibiotics with vancomycin and cefepime.  Initiated neutropenic precaution.  Dehydration: Continue gentle IV fluids.  Hyponatremia: Improving.  Sodium of 131 today.  Continue gentle IV fluids.  Hypokalemia/Hypomagnesemia: Supplemented with potassium,magnesium.  Pancytopenia: ANC less than 1000.  Started on  granix. Oncology following.  Also has severe anemia and thrombocytopenia.  Hb today 6.9. He was transfused with 1 unit of  PRBC on 01/04/2019.  Will transfuse him with 2 units of PRBC again today. Platelets of 47.  Will transfuse with platelets if is less than 20,000.  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  Eliquis stopped due to thrombocytopenia .Will resume when appropriate.   Nutrition Problem: Moderate Malnutrition Etiology: chronic illness, catabolic illness, cancer and cancer related treatments      DVT prophylaxis:SCD Code Status: Full Family Communication: None present at the bedside Disposition Plan: Home after stabilization of neutropenia, radiation colitis   Consultants: GI, oncology  Procedures: None  Antimicrobials:  Anti-infectives (From admission, onward)   Start     Dose/Rate Route Frequency Ordered Stop   01/04/19 2200  vancomycin (VANCOCIN) 1,250 mg in sodium chloride 0.9 % 250 mL IVPB     1,250 mg 166.7 mL/hr over 90 Minutes Intravenous Every 12 hours 01/04/19 0819     01/04/19 0900  vancomycin (VANCOCIN) 1,750 mg in sodium chloride 0.9 % 500 mL IVPB     1,750 mg 250 mL/hr over 120 Minutes Intravenous  Once 01/04/19 0819 01/04/19 1131   01/04/19 0830  ceFEPIme (MAXIPIME) 2 g in sodium chloride 0.9 % 100 mL IVPB     2 g 200 mL/hr over 30 Minutes Intravenous Every 8 hours 01/04/19 0815        Subjective: Patient seen and examined at the bedside this morning.  Mild grade fever this morning.  Mild sinus tachycardia.  Denies any abdominal pain.  Having output of liquidy stool in the ileostomy bag.  Tolerating diet.  Objective: Vitals:   01/04/19 1855 01/04/19 2125 01/05/19 0117 01/05/19 0621  BP:  (!) 99/58 122/60 117/64  Pulse:  96 (!) 108 (!) 118  Resp:  18 20 20   Temp: 100.1 F (37.8 C) 98.9 F (37.2 C) 99.8 F (37.7 C) 100.3 F (37.9 C)  TempSrc: Axillary Oral Oral Oral  SpO2:  94% 95% 92%  Weight:      Height:        Intake/Output Summary  (Last 24 hours) at 01/05/2019 1121 Last data filed at 01/05/2019 1017 Gross per 24 hour  Intake 5567.83 ml  Output 3975 ml  Net 1592.83 ml   Filed Weights   01/01/19 1052 01/02/19 2050  Weight: 90.7 kg 86.3 kg    Examination:   General exam: Appears calm and comfortable ,Not in distress,average built HEENT:PERRL,Oral mucosa moist, Ear/Nose normal on gross exam Respiratory system: Bilateral equal air entry, normal vesicular breath sounds, no wheezes or crackles  Cardiovascular system: S1 & S2 heard, RRR. No JVD, murmurs, rubs, gallops or clicks.  Chemo-Port on the right chest. Gastrointestinal system: Abdomen is nondistended, soft.Ileostomy bag filled with liquid brown stool.  Mild generalized tenderness. Central nervous system: Alert and oriented. No focal neurological deficits. Extremities: No edema, no clubbing ,no cyanosis, distal peripheral pulses palpable. Skin: No rashes, lesions or ulcers,no icterus ,no pallor MSK: Normal muscle bulk,tone ,power   Data Reviewed: I have personally reviewed following labs and imaging studies  CBC: Recent Labs  Lab 01/01/19 1120 01/02/19 0345 01/03/19 0840 01/04/19 0451 01/05/19 0314  WBC 2.3* 1.5* 1.6* 1.0* 1.1*  NEUTROABS 1.9  --   --  0.8* 0.8*  HGB 9.3* 7.9* 7.8* 6.8* 6.9*  HCT 26.6* 23.4* 23.7* 20.8* 21.2*  MCV 94.0 95.1 97.9 99.0 94.6  PLT 105* 79* 67* 51* 47*   Basic Metabolic Panel: Recent Labs  Lab 01/01/19 1120  01/02/19 0825 01/02/19 1400 01/03/19 0840 01/04/19 0451 01/05/19 0314  NA 120*   < > 124* 126* 129* 130* 131*  K 3.1*   < > 3.1* 3.4* 3.3* 3.3* 3.0*  CL 76*   < > 89* 91* 97* 100 103  CO2 26   < > 27 25 21* 23 21*  GLUCOSE 218*   < > 101* 135* 119* 106* 110*  BUN 29*   < > 15 17 14 11 8   CREATININE 1.07   < > 0.72 0.75 0.68 0.61 0.53*  CALCIUM 8.1*   < > 7.5* 7.5* 7.5* 7.4* 7.2*  MG 2.0  --   --   --   --   --  1.2*   < > = values in this interval not displayed.   GFR: Estimated Creatinine Clearance:  104.1 mL/min (A) (by C-G formula based on SCr of 0.53 mg/dL (L)). Liver Function Tests: Recent Labs  Lab 01/01/19 1120 01/02/19 0345  AST 59* 36  ALT 69* 53*  ALKPHOS 120 88  BILITOT 1.4* 0.8  PROT 6.3* 4.8*  ALBUMIN 2.5* 1.9*   No results for input(s): LIPASE, AMYLASE in the last 168 hours. No results for input(s): AMMONIA in the last 168 hours. Coagulation Profile: No results for input(s): INR, PROTIME in the last 168 hours. Cardiac Enzymes: No results for input(s): CKTOTAL, CKMB, CKMBINDEX, TROPONINI in the last 168 hours. BNP (last 3 results) No results for input(s): PROBNP in the last 8760 hours. HbA1C: No results for input(s): HGBA1C in the last 72 hours. CBG: Recent Labs  Lab 01/04/19 [phone] 01/04/19  1143 01/04/19 1641 01/04/19 2122 01/05/19 0737  GLUCAP 109* 207* 229* 154* 105*   Lipid Profile: No results for input(s): CHOL, HDL, LDLCALC, TRIG, CHOLHDL, LDLDIRECT in the last 72 hours. Thyroid Function Tests: No results for input(s): TSH, T4TOTAL, FREET4, T3FREE, THYROIDAB in the last 72 hours. Anemia Panel: No results for input(s): VITAMINB12, FOLATE, FERRITIN, TIBC, IRON, RETICCTPCT in the last 72 hours. Sepsis Labs: No results for input(s): PROCALCITON, LATICACIDVEN in the last 168 hours.  Recent Results (from the past 240 hour(s))  Gastrointestinal Panel by PCR , Stool     Status: None   Collection Time: 01/01/19  3:36 PM  Result Value Ref Range Status   Campylobacter species NOT DETECTED NOT DETECTED Final   Plesimonas shigelloides NOT DETECTED NOT DETECTED Final   Salmonella species NOT DETECTED NOT DETECTED Final   Yersinia enterocolitica NOT DETECTED NOT DETECTED Final   Vibrio species NOT DETECTED NOT DETECTED Final   Vibrio cholerae NOT DETECTED NOT DETECTED Final   Enteroaggregative E coli (EAEC) NOT DETECTED NOT DETECTED Final   Enteropathogenic E coli (EPEC) NOT DETECTED NOT DETECTED Final   Enterotoxigenic E coli (ETEC) NOT DETECTED NOT  DETECTED Final   Shiga like toxin producing E coli (STEC) NOT DETECTED NOT DETECTED Final   E. coli O157 NOT DETECTED NOT DETECTED Final   Shigella/Enteroinvasive E coli (EIEC) NOT DETECTED NOT DETECTED Final   Cryptosporidium NOT DETECTED NOT DETECTED Final   Cyclospora cayetanensis NOT DETECTED NOT DETECTED Final   Entamoeba histolytica NOT DETECTED NOT DETECTED Final   Giardia lamblia NOT DETECTED NOT DETECTED Final   Adenovirus F40/41 NOT DETECTED NOT DETECTED Final   Astrovirus NOT DETECTED NOT DETECTED Final   Norovirus GI/GII NOT DETECTED NOT DETECTED Final   Rotavirus A NOT DETECTED NOT DETECTED Final   Sapovirus (I, II, IV, and V) NOT DETECTED NOT DETECTED Final    Comment: Performed at [facility], [place] [place], [place] [address]  C Difficile Quick Screen w PCR reflex     Status: None   Collection Time: 01/01/19  3:41 PM  Result Value Ref Range Status   C Diff antigen NEGATIVE NEGATIVE Final   C Diff toxin NEGATIVE NEGATIVE Final   C Diff interpretation No C. difficile detected.  Final    Comment: Performed at [facility], [place] [address], [place], [place] [address]  MRSA PCR Screening     Status: None   Collection Time: 01/01/19  3:54 PM  Result Value Ref Range Status   MRSA by PCR NEGATIVE NEGATIVE Final    Comment:        The GeneXpert MRSA Assay (FDA approved for NASAL specimens only), is one component of a comprehensive MRSA colonization surveillance program. It is not intended to diagnose MRSA infection nor to guide or monitor treatment for MRSA infections. Performed at [facility], [place] [address], [place], [place] [address]   Culture, blood (routine x 2)     Status: None (Preliminary result)   Collection Time: 01/04/19  9:00 AM  Result Value Ref Range Status   Specimen Description   Final    BLOOD PORTA CATH Performed at [facility] [address] [place], [place] [address]     Special Requests   Final    BOTTLES DRAWN AEROBIC AND ANAEROBIC Blood Culture adequate volume Performed at [facility] [address] [place], [place] [address]    Culture   Final  NO GROWTH < 24 HOURS Performed at [facility] [address] [place], [place] [address]    Report Status PENDING  Incomplete  Culture, blood (routine x 2)     Status: None (Preliminary result)   Collection Time: 01/04/19 10:09 AM  Result Value Ref Range Status   Specimen Description   Final    BLOOD RIGHT HAND Performed at [facility] [address] [place], [place] [address]    Special Requests   Final    BOTTLES DRAWN AEROBIC AND ANAEROBIC Blood Culture adequate volume Performed at [facility] [address] [place], [place] [address]    Culture   Final    NO GROWTH < 24 HOURS Performed at [facility] [address], [place], [place] [address]    Report Status PENDING  Incomplete         Radiology Studies: No results found.      Scheduled Meds: . sodium chloride   Intravenous Once  . sodium chloride   Intravenous Once  . B-complex with vitamin C  1 tablet Oral Daily  . Chlorhexidine Gluconate Cloth  6 each Topical Daily  . feeding supplement (ENSURE ENLIVE)  237 mL Oral BID BM  . insulin aspart  0-5 Units Subcutaneous QHS  . insulin aspart  0-9 Units Subcutaneous TID WC  . potassium chloride  40 mEq Oral Q4H  . Tbo-filgastrim (GRANIX) SQ  300 mcg Subcutaneous q1800  . zinc sulfate  220 mg Oral BID   Continuous Infusions: . sodium chloride 100 mL/hr at 01/05/19 0734  . ceFEPime (MAXIPIME) IV 2 g (01/05/19 0507)  . vancomycin 1,250 mg (01/05/19 1108)     LOS: 4 days    Time spent: 25  mins.More than 50% of that time was spent in counseling and/or coordination of care.      [name], MD [facility] Pager [phone]  If 7PM-7AM, please contact night-coverage [email or web address]  Password [facility] 01/05/2019, 11:21 AM

## 2019-01-05 NOTE — Progress Notes (Signed)
DBIV consult canceled: Pt has active crossmatch already. Blood bank bracelet intact, per Amy, RN.

## 2019-01-05 NOTE — Progress Notes (Signed)
HEMATOLOGY/ONCOLOGY INPATIENT PROGRESS NOTE  Date of Service: 01/05/2019  Inpatient Attending: .[name], MD   SUBJECTIVE:   Mark Frey was seen in follow-up.  We did conference call with his wife [name] when I was talking to him in the room to give her an update as per her and his wishes . He notes some decrease in stool output from his ileostomy and rectally as well.  Still slightly blood-tinged.  Has been getting short-chain fatty acid enemas twice daily.  Still with poor appetite but trying to eat better.  Sodium levels improving.  Appears more alert. Had an episode of fever of 101.5 this a.m. and blood cultures have been sent and he has been started empirically on cefepime and vancomycin. After discussing with him I did add Granix for his neutropenia in the setting of possible significant infection. I discussed with him the importance of trying to optimize his p.o. intake. He did receive 1 unit of PRBCs today for anemia with hemoglobin of 6.8. No fever since this morning. Some abdominal soreness especially in the left side of the abdomen. We discussed that his LDH levels are within normal limits and he had no abdominal lymphadenopathy suggesting progression of his Burkitt's lymphoma.  OBJECTIVE:  Resting comfortably in bed  PHYSICAL EXAMINATION: . Vitals:   01/05/19 0117 01/05/19 0621 01/05/19 1254 01/05/19 1333  BP: 122/60 117/64 111/71 (!) 126/58  Pulse: (!) 108 (!) 118 (!) 110 (!) 110  Resp: 20 20 18 16   Temp: 99.8 F (37.7 C) 100.3 F (37.9 C) 99 F (37.2 C) 99.4 F (37.4 C)  TempSrc: Oral Oral Oral Oral  SpO2: 95% 92% 95% 96%  Weight:      Height:       Filed Weights   01/01/19 1052 01/02/19 2050  Weight: 200 lb (90.7 kg) 190 lb 4.1 oz (86.3 kg)   .Body mass index is 28.1 kg/m.  GENERAL: Resting, somewhat pale appearing, in no acute distress. OROPHARYNX: Oral mucosa moist LUNGS: clear to auscultation HEART: regular rate & rhythm ABDOMEN:  Nontender and soft ileostomy in situ Musculoskeletal: no pedal edema PSYCH: alert & oriented x 3 with fluent speech NEURO: no focal motor/sensory deficits  MEDICAL HISTORY:  Past Medical History:  Diagnosis Date   ALLERGIC RHINITIS    Cancer ([place])    Lymphoma    Diabetes mellitus    Hyperlipidemia     SURGICAL HISTORY: Past Surgical History:  Procedure Laterality Date   BIOPSY  03/15/2018   Procedure: BIOPSY;  Surgeon: Milus Banister, MD;  Location: [facility];  Service: Endoscopy;;   BOWEL RESECTION N/A 08/25/2018   Procedure: LAPROSCOPIC LOOP ILEOSTOMY;  Surgeon: Greer Pickerel, MD;  Location: [name];  Service: General;  Laterality: N/A;   CLEFT PALATE REPAIR     COLONOSCOPY N/A 03/15/2018   Procedure: COLONOSCOPY;  Surgeon: Milus Banister, MD;  Location: [name];  Service: Endoscopy;  Laterality: N/A;   COLONOSCOPY N/A 08/20/2018   Procedure: COLONOSCOPY;  Surgeon: Irene Shipper, MD;  Location: [facility];  Service: Endoscopy;  Laterality: N/A;   deviated septum repair     slight improvement   ESOPHAGOGASTRODUODENOSCOPY N/A 03/15/2018   Procedure: ESOPHAGOGASTRODUODENOSCOPY (EGD);  Surgeon: Milus Banister, MD;  Location: [name];  Service: Endoscopy;  Laterality: N/A;   IR IMAGING GUIDED PORT INSERTION  03/17/2018   LAPAROSCOPY N/A 08/25/2018   Procedure: LAPAROSCOPY DIAGNOSTIC;  Surgeon: Greer Pickerel, MD;  Location: [facility];  Service: General;  Laterality: N/A;  TONSILLECTOMY      SOCIAL HISTORY: Social History   Socioeconomic History   Marital status: Married    Spouse name: lisa   Number of children: 0   Years of education: Not on file   Highest education level: Not on file  Occupational History   Not on file  Social Needs   Financial resource strain: Not hard at all   Food insecurity:    Worry: Never true    Inability: Never true   Transportation needs:    Medical: No    Non-medical: No  Tobacco Use   Smoking status:  Never Smoker   Smokeless tobacco: Never Used  Substance and Sexual Activity   Alcohol use: Yes    Comment: occasional   Drug use: No   Sexual activity: Yes  Lifestyle   Physical activity:    Days per week: 0 days    Minutes per session: 0 min   Stress: Not at all  Relationships   Social connections:    Talks on phone: More than three times a week    Gets together: More than three times a week    Attends religious service: 1 to 4 times per year    Active member of club or organization: No    Attends meetings of clubs or organizations: Never    Relationship status: Married   Intimate partner violence:    Fear of current or ex partner: No    Emotionally abused: No    Physically abused: No    Forced sexual activity: No  Other Topics Concern   Not on file  Social History Narrative   Married 1985. No kids. 4 small dogs.       Works in Financial trader, residential      Hobbies: work on cars, Haematologist, exercise as able    FAMILY HISTORY: Family History  Problem Relation Age of Onset   Lung cancer Mother        smoker   Brain cancer Mother        metastasis   AAA (abdominal aortic aneurysm) Father        smoker    ALLERGIES:  is allergic to ciprofloxacin.  MEDICATIONS:  Scheduled Meds:  sodium chloride   Intravenous Once   B-complex with vitamin C  1 tablet Oral Daily   Chlorhexidine Gluconate Cloth  6 each Topical Daily   feeding supplement (ENSURE ENLIVE)  237 mL Oral BID BM   insulin aspart  0-5 Units Subcutaneous QHS   insulin aspart  0-9 Units Subcutaneous TID WC   Tbo-filgastrim (GRANIX) SQ  300 mcg Subcutaneous q1800   zinc sulfate  220 mg Oral BID   Continuous Infusions:  sodium chloride Stopped (01/05/19 1315)   ceFEPime (MAXIPIME) IV 2 g (01/05/19 1338)   vancomycin 1,250 mg (01/05/19 1108)   PRN Meds:.acetaminophen **OR** acetaminophen, ondansetron **OR** ondansetron (ZOFRAN) IV, sodium chloride flush  REVIEW OF  SYSTEMS:    10 Point review of Systems was done is negative except as noted above.   LABORATORY DATA:  I have reviewed the data as listed  . CBC Latest Ref Rng & Units 01/05/2019 01/04/2019 01/03/2019  WBC 4.0 - 10.5 K/uL 1.1(LL) 1.0(LL) 1.6(L)  Hemoglobin 13.0 - 17.0 g/dL 6.9(LL) 6.8(LL) 7.8(L)  Hematocrit 39.0 - 52.0 % 21.2(L) 20.8(L) 23.7(L)  Platelets 150 - 400 K/uL 47(L) 51(L) 67(L)    . CMP Latest Ref Rng & Units 01/05/2019 01/04/2019 01/03/2019  Glucose 70 - 99 mg/dL 110(H) 106(H)  119(H)  BUN 8 - 23 mg/dL 8 11 14   Creatinine 0.61 - 1.24 mg/dL 0.53(L) 0.61 0.68  Sodium 135 - 145 mmol/L 131(L) 130(L) 129(L)  Potassium 3.5 - 5.1 mmol/L 3.0(L) 3.3(L) 3.3(L)  Chloride 98 - 111 mmol/L 103 100 97(L)  CO2 22 - 32 mmol/L 21(L) 23 21(L)  Calcium 8.9 - 10.3 mg/dL 7.2(L) 7.4(L) 7.5(L)  Total Protein 6.5 - 8.1 g/dL - - -  Total Bilirubin 0.3 - 1.2 mg/dL - - -  Alkaline Phos 38 - 126 U/L - - -  AST 15 - 41 U/L - - -  ALT 0 - 44 U/L - - -     RADIOGRAPHIC STUDIES: I have personally reviewed the radiological images as listed and agreed with the findings in the report. Dg Chest 2 View  Result Date: 01/01/2019 CLINICAL DATA:  Short of breath.  Recent chemotherapy for lymphoma EXAM: CHEST - 2 VIEW COMPARISON:  08/24/2018 FINDINGS: Heart size and vascularity normal. Mild linear densities in the right lung base are new and may be atelectasis. No pleural effusion. Port-A-Cath tip SVC. IMPRESSION: Mild right lower lobe atelectasis.  Negative for pneumonia. Electronically Signed   By: Franchot Gallo M.D.   On: 01/01/2019 19:38   Ct Abdomen Pelvis W Contrast  Result Date: 01/01/2019 CLINICAL DATA:  Diarrhea and weakness. History of Burkitt's lymphoma. Recently completed radiation. Ileostomy EXAM: CT ABDOMEN AND PELVIS WITH CONTRAST TECHNIQUE: Multidetector CT imaging of the abdomen and pelvis was performed using the standard protocol following bolus administration of intravenous contrast. CONTRAST:   115m OMNIPAQUE IOHEXOL 300 MG/ML  SOLN COMPARISON:  CT abdomen pelvis 10/05/2018 FINDINGS: Lower chest: Mild scarring in the lung bases. Negative for infiltrate or effusion. Hepatobiliary: Small subcentimeter hypodensities in the liver are unchanged and likely benign cysts. Hypodensity adjacent to the interhemispheric fissure unchanged compatible with fatty infiltration of the liver. Gallbladder contracted and mildly thickened. No biliary dilatation. Pancreas: Negative Spleen: Negative Adrenals/Urinary Tract: 5 mm right lower pole calculus and 2 mm right midpole calculus. No renal obstruction or mass. Negative bladder. Stomach/Bowel: Ileostomy. Marked thickening of the entire colon from cecum to rectum with extensive stranding in the pericolonic fat. No free air or free fluid. Small bowel nondilated and non thickened. Vascular/Lymphatic: Minimal atherosclerotic disease in the aorta. Negative for lymphadenopathy. Reproductive: Moderate prostate enlargement with numerous calcifications. Other: Negative for abscess Musculoskeletal: Lumbar degenerative changes without acute skeletal abnormality. IMPRESSION: Findings compatible with severe colitis. Diffuse thickening of the colon with surrounding edema in the pericolonic fat. No abscess or free fluid. No small bowel obstruction. Ileostomy noted in the right lower quadrant. Negative for mass or adenopathy. Electronically Signed   By: CFranchot GalloM.D.   On: 01/01/2019 19:37    ASSESSMENT & PLAN:  63y.o. male with  1. 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 No BM or CNS involvement.   04/18/18 PET/CT revealed Continued improvement in right colonic wall thickening and surrounding bulky masses consistent with treated lymphoma. No hypermetabolic activity to suggest residual tumor. There is some  hypermetabolic activity associated with the sigmoid colon which demonstrates mild wall thickening, surrounding inflammation and underlying diverticulosis, suggesting mild diverticulitis. Suspected treatment related changes throughout the bone marrow. There is mildly increased activity within the clivus without clear corresponding finding on the CT images. Attention on follow-up recommended. Nonobstructing right renal calculi. 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   06/09/18 PET/CT revealedSoft tissue lesion within the ventral pelvis is again identified demonstrating mild to moderate increased uptake within SUV max of 5.61. Deauville criteria 4. Concerning for residual metabolically active tumor. 2. Similar appearance of diffusely thickened appendix within the right lower quadrant of the abdomen. Findings may reflect treatment related changes. 3. Increased radiotracer uptake throughout the bone marrow which is favored to represent treatment related changes. 4. Lad coronary artery atherosclerotic calcifications.   S/p 6 cycles of EPOCH-R completed on 07/03/18  10/05/18 CT A/P revealed Persistent wall thickening in the sigmoid colon although a component of this could be due to diverticulosis. There is also a suggestion of wall thickening and potential mucosal enhancement in the terminal ileum, as well as adjacent indistinct stranding in the mesenteric and omental adipose tissues roughly similar to the prior exam. Some of this may be treatment related. Inflammatory process such as Crohn's disease not excluded. 2. No current adenopathy. 3. Other  imaging findings of potential clinical significance: Nonobstructive right nephrolithiasis. Aortic Atherosclerosis. Moderate prostatomegaly. Left foraminal impingement at L4-5.  S/p IMRT of 36Gy over 20 fractions, completed 12/06/18.  2.H/oRLE DVT and b/l PE.  Anticoagulation held at this time in the setting of thrombocytopenia and GI bleeding from severe radiation colitis. -May consider prophylactic Lovenox while hospitalized if platelets above 50k and no overt GI bleeding with stable hemoglobin. -SCD for DVT prophylaxis.  3. H/o Small bowel obstruction/ileus with sigmoid thickening/stenosis . CT scan - sigmoid obstructive lesion ? Scar tissue from resolved lymphoma vs residual lymphoma vs post inflammatory scarring from diverticulitis/limited perforation. Also has SBO from adhesions. Colonoscopy 08/21/2018 - no intramucosal lesions. Extrinsic compression of sigmoid colon. 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 Greer Pickerel  4.  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  GI panel and C. difficile negative.  CMV IgM negative, EBV IgM negative. 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  5.  Dehydration with hypovolemic hyponatremia.  Other electrolyte issues including hypokalemia and hypomagnesemia. 6.  Pancytopenia Anemia likely multifactorial but could be from GI bleeding plus significant inflammation from severe radiation colitis. Leukopenia/lymphopenia likely from radiation toxicity.  Protein-losing enteropathy can also accentuate the lymphopenia and bone marrow suppression due to severe inflammatory state from his radiation colitis. Thrombocytopenia from consumption related to GI bleeding and from radiation toxicity. B12 within normal limits Copper levels within normal limits at 132  zinc deficiency noted- 44 Viral work-up ordered and currently pending Low likelihood of Burkitt's lymphoma progression at  this time. Plan -IV fluids and electrolyte replacement as per hospital medicine team.  Much appreciate their help -GI following for management of radiation pancolitis and has been pursuing short-chain fatty acid enemas. -Blood culture sent out in the setting of fever and patient has been now started on empiric broad-spectrum antibiotics per hospital medicine in the setting of neutropenia. -Discussed with patient and started him on Granix to try to treat his neutropenia in the setting of severe pancolitis and possible infection. -Viral work-up reviewed and is currently pending. -Ordered oral replacement for zinc deficiency. -Empiric B complex. -Nutritional consultation to optimize nutritional status  since his colitis with protein losing enteropathy and poor p.o. intake will have a significant bearing on his prognosis and cytopenias. -If poor p.o. intake persists and cannot be mitigated with nutritional support might need to consider TPN. -Transfuse PRBC PRN for hemoglobin of less than 7.5 or if actively bleeding or if symptomatic. -Transfuse platelets as needed for platelet count of less than 20,000 in the setting of GI ulceration or if actively bleeding and less than 50,000. -We will continue to follow -Updated his wife [name] on the phone regarding his current condition.  . The total time spent in the appointment was 35 minutes and more than 50% was on counseling and direct patient cares.     Sullivan Lone MD [place] AAHIVMS [facility] Hematology/Oncology [facility]  (Office):       [phone] (Work cell):  [phone] (Fax):           [phone]

## 2019-01-06 LAB — BASIC METABOLIC PANEL
Anion gap: 7 (ref 5–15)
BUN: 8 mg/dL (ref 8–23)
CO2: 21 mmol/L — ABNORMAL LOW (ref 22–32)
Calcium: 7.1 mg/dL — ABNORMAL LOW (ref 8.9–10.3)
Chloride: 103 mmol/L (ref 98–111)
Creatinine, Ser: 0.52 mg/dL — ABNORMAL LOW (ref 0.61–1.24)
GFR calc Af Amer: >60 mL/min (ref >60)
GFR calc non Af Amer: >60 mL/min (ref >60)
Glucose, Bld: 104 mg/dL — ABNORMAL HIGH (ref 70–99)
Potassium: 3.1 mmol/L — ABNORMAL LOW (ref 3.5–5.1)
Sodium: 131 mmol/L — ABNORMAL LOW (ref 135–145)

## 2019-01-06 LAB — CBC WITH DIFFERENTIAL/PLATELET
Abs Immature Granulocytes: 0.1 10*3/uL — ABNORMAL HIGH (ref 0.00–0.07)
Basophils Absolute: 0 10*3/uL (ref 0.0–0.1)
Basophils Relative: 1 %
Eosinophils Absolute: 0 10*3/uL (ref 0.0–0.5)
Eosinophils Relative: 1 %
HCT: 22.5 % — ABNORMAL LOW (ref 39.0–52.0)
Hemoglobin: 7.5 g/dL — ABNORMAL LOW (ref 13.0–17.0)
Immature Granulocytes: 7 %
Lymphocytes Relative: 8 %
Lymphs Abs: 0.1 10*3/uL — ABNORMAL LOW (ref 0.7–4.0)
MCH: 31.1 pg (ref 26.0–34.0)
MCHC: 33.3 g/dL (ref 30.0–36.0)
MCV: 93.4 fL (ref 80.0–100.0)
Monocytes Absolute: 0.2 10*3/uL (ref 0.1–1.0)
Monocytes Relative: 12 %
Neutro Abs: 1.1 10*3/uL — ABNORMAL LOW (ref 1.7–7.7)
Neutrophils Relative %: 71 %
Platelets: 42 10*3/uL — ABNORMAL LOW (ref 150–400)
RBC: 2.41 MIL/uL — ABNORMAL LOW (ref 4.22–5.81)
RDW: 19.5 % — ABNORMAL HIGH (ref 11.5–15.5)
WBC: 1.5 10*3/uL — ABNORMAL LOW (ref 4.0–10.5)
nRBC: 0 % (ref 0.0–0.2)

## 2019-01-06 LAB — GLUCOSE, CAPILLARY
Glucose-Capillary: 109 mg/dL — ABNORMAL HIGH (ref 70–99)
Glucose-Capillary: 187 mg/dL — ABNORMAL HIGH (ref 70–99)
Glucose-Capillary: 140 mg/dL — ABNORMAL HIGH (ref 70–99)
Glucose-Capillary: 98 mg/dL (ref 70–99)

## 2019-01-06 MED ORDER — POTASSIUM CHLORIDE CRYS ER 20 MEQ PO TBCR
40.0000 meq | EXTENDED_RELEASE_TABLET | ORAL | Status: AC
  Administered 2019-01-06 (×2): 40 meq via ORAL
  Filled 2019-01-06 (×2): qty 2

## 2019-01-06 NOTE — Progress Notes (Addendum)
   Patient Name: CLETO CLAGGETT Date of Encounter: 01/06/2019, 9:47 AM    Subjective  No sig changes reported   Objective  BP 112/73 (BP Location: Right Arm)   Pulse (!) 103   Temp 98.8 F (37.1 C) (Oral)   Resp 18   Ht 5' 9"  (1.753 m)   Wt 86.3 kg   SpO2 96%   BMI 28.10 kg/m  Chronically ill wm abd ileostomy RUQ Soft and NT  CBC Latest Ref Rng & Units 01/06/2019 01/05/2019 01/05/2019  WBC 4.0 - 10.5 K/uL 1.5(L) - 1.1(LL)  Hemoglobin 13.0 - 17.0 g/dL 7.5(L) 7.7(L) 6.9(LL)  Hematocrit 39.0 - 52.0 % 22.5(L) 23.3(L) 21.2(L)  Platelets 150 - 400 K/uL 42(L) - 47(L)       Assessment and Plan   #61 63 year old white male with Burkitt's lymphoma, status post chemotherapy and just having completed radiation therapy to the abdomen which included a wide field of bowel Patient admitted with confusion, progressive weakness, anorexia, increased ostomy output and bloody mucoid stool from the rectum  Patient status post loop ileostomy December 2019 for 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   Patient has new left lower quadrant tenderness-if this persists will need repeat CT scan to rule out perforation/abscess  Not tender today #2  Pancytopenia - supportive care - transfusions, neutrophil stim factor   #3 fever-on broad-spectrum antibiotics, source unclear.  This may be secondary to acute colitis, however with neutropenia also concern for bacteremia. Blood cultures pending  No fever today  #4 hyponatremia-improved and stable. Gatha Mayer, MD, [facility] 01/06/2019 9:47 AM  Pager [phone]

## 2019-01-06 NOTE — Evaluation (Signed)
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  Thought to be radiation induced diarrhea due to radiation colitis  Clinical Impression  Pt admitted with above diagnosis. Pt currently with functional limitations due to the deficits listed below (see PT Problem List). Pt ambulated 150' with RW with min/guard assist for mild unsteadiness. At baseline he ambulates with a rollator and is independent in ADLs. He reports his wife can provide 27* assistance at home upon acute DC.  Pt will benefit from skilled PT to increase their independence and safety with mobility to allow discharge to the venue listed below.       Follow Up Recommendations Home health PT    Equipment Recommendations  None recommended by PT    Recommendations for Other Services       Precautions / Restrictions Precautions Precautions: Other (comment) Precaution Comments: monitor HR; pt denies h/o falls in past 1 year Restrictions Weight Bearing Restrictions: No      Mobility  Bed Mobility Overal bed mobility: Modified Independent             General bed mobility comments: used rail for supine to sit, +2 assist to scoot up in bed in supine  Transfers Overall transfer level: Needs assistance Equipment used: Rolling walker (2 wheeled) Transfers: Sit to/from Stand Sit to Stand: Min assist         General transfer comment: VCs hand placement, min A to rise from bed in low position, min/guard from elevated bed; HR 130 with sit to stand  Ambulation/Gait Ambulation/Gait assistance: Min guard Gait Distance  (Feet): 150 Feet Assistive device: Rolling walker (2 wheeled) Gait Pattern/deviations: Step-through pattern Gait velocity: WFL   General Gait Details: min/guard for mild unsteadiness but no overt loss of balance, heart monitor did not read during ambulation  Stairs            Wheelchair Mobility    Modified Rankin (Stroke Patients Only)       Balance Overall balance assessment: Modified Independent                                           Pertinent Vitals/Pain Pain Assessment: No/denies pain    Home Living Family/patient expects to be discharged to:: Private residence Living Arrangements: Spouse/significant other Available Help at Discharge: Family;Available 24 hours/day Type of Home: House Home Access: Stairs to enter Entrance Stairs-Rails: None Entrance Stairs-Number of Steps: 2 Home Layout: One level Home Equipment: Cane - single point;Walker - 4 wheels      Prior Function Level of Independence: Independent with assistive device(s)         Comments: walks with rollator; independent bathing/dressing     Hand Dominance   Dominant Hand: Right    Extremity/Trunk Assessment   Upper Extremity Assessment Upper Extremity Assessment: Overall WFL for tasks assessed    Lower Extremity Assessment Lower Extremity Assessment: Overall WFL for tasks assessed    Cervical / Trunk Assessment Cervical / Trunk Assessment: Normal  Communication   Communication: No difficulties  Cognition Arousal/Alertness:  Awake/alert Behavior During Therapy: WFL for tasks assessed/performed Overall Cognitive Status: Within Functional Limits for tasks assessed                                        General Comments      Exercises     Assessment/Plan    PT Assessment Patient needs continued PT services  PT Problem List Decreased activity tolerance;Decreased mobility       PT Treatment Interventions Gait training;Functional mobility  training;Therapeutic activities;Therapeutic exercise;Patient/family education    PT Goals (Current goals can be found in the Care Plan section)  Acute Rehab PT Goals Patient Stated Goal: likes to do home remodeling PT Goal Formulation: With patient Time For Goal Achievement: 01/20/19 Potential to Achieve Goals: Good    Frequency Min 3X/week   Barriers to discharge        Co-evaluation               AM-PAC PT "6 Clicks" Mobility  Outcome Measure Help needed turning from your back to your side while in a flat bed without using bedrails?: A Little Help needed moving from lying on your back to sitting on the side of a flat bed without using bedrails?: A Little Help needed moving to and from a bed to a chair (including a wheelchair)?: A Little Help needed standing up from a chair using your arms (e.g., wheelchair or bedside chair)?: A Little Help needed to walk in hospital room?: A Little Help needed climbing 3-5 steps with a railing? : A Lot 6 Click Score: 17    End of Session Equipment Utilized During Treatment: Gait belt Activity Tolerance: Patient limited by fatigue;Patient tolerated treatment well Patient left: in bed;with call bell/phone within reach Nurse Communication: Mobility status PT Visit Diagnosis: Difficulty in walking, not elsewhere classified (R26.2)    Time: 0350-0938 PT Time Calculation (min) (ACUTE ONLY): 29 min   Charges:   PT Evaluation $PT Eval Low Complexity: 1 Low PT Treatments $Gait Training: 8-22 mins        Blondell Reveal Kistler PT 01/06/2019  Acute Rehabilitation Services Pager [phone] Office [phone]

## 2019-01-06 NOTE — Progress Notes (Signed)
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  GI, oncology following.    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  Showed marked thickening of the entire colon from cecum to rectum.  GI following.  Started therapy for  radiation colitis with short chain fatty acids enema but has been stopped now due to concern for neutropenic fever. He denies any abdominal pain today.  Has mild generalized abdominal tenderness.  Tolerating diet.  Passing mucoid loose stool in the ostomy bag.  Also passing some stool from rectum. GI pathogen panel, C. difficile negative.  There was also concern for viral colitis.  CMV , EBV PCR negative , parvovirus Ab negative.  Fever: Became febrile and tachycardic .Afebrile this  morning.Will follow up blood cultures.NGTD. Started  on broad-spectrum antibiotics with vancomycin and cefepime.  Initiated neutropenic precaution.  Dehydration: Continue gentle IV fluids.  Hyponatremia: Improving.  Sodium of 131 today.  Continue gentle IV fluids.  Hypokalemia/Hypomagnesemia: Supplemented with potassium.  Pancytopenia: ANC less than 1000.  Started on granix. Oncology following.  Also has severe anemia and  thrombocytopenia.  Hb today 7.5 S/P 2 units of PRBC.[name] Platelets of 42.  Will transfuse with platelets if is less than 20,000.  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  Eliquis stopped due to thrombocytopenia .Will resume when appropriate.  Generalized weakness/deconditioning: We will request for physical therapy evaluation   Nutrition Problem: Moderate Malnutrition Etiology: chronic illness, catabolic illness, cancer and cancer related treatments      DVT prophylaxis:SCD Code Status: Full Family Communication: None present at the bedside Disposition Plan: Home after stabilization of neutropenia, radiation colitis   Consultants: GI, oncology  Procedures: None  Antimicrobials:  Anti-infectives (From admission, onward)   Start     Dose/Rate Route Frequency Ordered Stop   01/04/19 2200  vancomycin (VANCOCIN) 1,250 mg in sodium chloride 0.9 % 250 mL IVPB     1,250 mg 166.7 mL/hr over 90 Minutes Intravenous Every 12 hours 01/04/19 0819     01/04/19 0900  vancomycin (VANCOCIN) 1,750 mg in sodium chloride 0.9 % 500 mL IVPB     1,750 mg 250 mL/hr over 120 Minutes Intravenous  Once 01/04/19 0819 01/04/19 1131   01/04/19 0830  ceFEPIme (MAXIPIME) 2 g in sodium chloride 0.9 % 100 mL IVPB     2 g 200 mL/hr over 30 Minutes Intravenous Every 8 hours 01/04/19 0815        Subjective: Patient seen and examined at bedside this morning.  Afebrile today.  Hemodynamically stable.  Tolerating diet.  Having output of liquid stool in ileostomy bag and also from rectum.  Looks weak. Objective: Vitals:   01/05/19 1539 01/05/19 1800 01/05/19 1950 01/06/19 0534  BP: 120/72  123/74 112/73  Pulse: (!) 114 (!) 118 (!) 115 (!) 103  Resp: 18  18 18   Temp: 99 F (37.2 C)  99.6 F (37.6 C) 98.8 F (37.1 C)  TempSrc: Oral  Oral Oral  SpO2: 95%  95% 96%  Weight:      Height:        Intake/Output Summary (Last 24 hours) at 01/06/2019 1038 Last data filed at 01/05/2019 2300 Gross per 24 hour  Intake  1664.36 ml  Output 2445 ml  Net -780.64 ml   Filed Weights   01/01/19 1052 01/02/19 2050  Weight: 90.7 kg 86.3 kg    Examination:   General exam: Appears calm and comfortable ,Not in distress,average built HEENT:PERRL,Oral mucosa moist, Ear/Nose normal on gross exam Respiratory system: Bilateral equal air entry, normal vesicular breath sounds, no wheezes or crackles  Cardiovascular system: S1 & S2 heard, RRR. No JVD, murmurs, rubs, gallops or clicks.Porta cath on the right chest Gastrointestinal system: Abdomen is nondistended, soft .  Mild generalized tenderness more on the left lower quadrant.Ileostomy bag filled with mucoid stool.   Central nervous system: Alert and oriented. No focal neurological deficits. Extremities: No edema, no clubbing ,no cyanosis, distal peripheral pulses palpable. Skin: No rashes, lesions or ulcers,no icterus ,no pallor MSK: Normal muscle bulk,tone ,power Psychiatry: Judgement and insight appear normal. Mood & affect appropriate.      Data Reviewed: I have personally reviewed following labs and imaging studies  CBC: Recent Labs  Lab 01/01/19 1120 01/02/19 0345 01/03/19 0840 01/04/19 0451 01/05/19 0314 01/05/19 1824 01/06/19 0423  WBC 2.3* 1.5* 1.6* 1.0* 1.1*  --  1.5*  NEUTROABS 1.9  --   --  0.8* 0.8*  --  1.1*  HGB 9.3* 7.9* 7.8* 6.8* 6.9* 7.7* 7.5*  HCT 26.6* 23.4* 23.7* 20.8* 21.2* 23.3* 22.5*  MCV 94.0 95.1 97.9 99.0 94.6  --  93.4  PLT 105* 79* 67* 51* 47*  --  42*   Basic Metabolic Panel: Recent Labs  Lab 01/01/19 1120  01/02/19 1400 01/03/19 0840 01/04/19 0451 01/05/19 0314 01/05/19 1400 01/06/19 0423  NA 120*   < > 126* 129* 130* 131*  --  131*  K 3.1*   < > 3.4* 3.3* 3.3* 3.0*  --  3.1*  CL 76*   < > 91* 97* 100 103  --  103  CO2 26   < > 25 21* 23 21*  --  21*  GLUCOSE 218*   < > 135* 119* 106* 110*  --  104*  BUN 29*   < > 17 14 11 8   --  8  CREATININE 1.07   < > 0.75 0.68 0.61 0.53*  --  0.52*  CALCIUM 8.1*   < >  7.5* 7.5* 7.4* 7.2*  --  7.1*  MG 2.0  --   --   --   --  1.2*  --   --   PHOS  --   --   --   --   --   --  2.0*  --    < > = values in this interval not displayed.   GFR: Estimated Creatinine Clearance: 104.1 mL/min (A) (by C-G formula based on SCr of 0.52 mg/dL (L)). Liver Function Tests: Recent Labs  Lab 01/01/19 1120 01/02/19 0345  AST 59* 36  ALT 69* 53*  ALKPHOS 120 88  BILITOT 1.4* 0.8  PROT 6.3* 4.8*  ALBUMIN 2.5* 1.9*   No results for input(s): LIPASE, AMYLASE  in the last 168 hours. No results for input(s): AMMONIA in the last 168 hours. Coagulation Profile: No results for input(s): INR, PROTIME in the last 168 hours. Cardiac Enzymes: No results for input(s): CKTOTAL, CKMB, CKMBINDEX, TROPONINI in the last 168 hours. BNP (last 3 results) No results for input(s): PROBNP in the last 8760 hours. HbA1C: No results for input(s): HGBA1C in the last 72 hours. CBG: Recent Labs  Lab 01/05/19 0737 01/05/19 1152 01/05/19 1623 01/05/19 2002 01/06/19 0736  GLUCAP 105* 168* 168* 172* 109*   Lipid Profile: No results for input(s): CHOL, HDL, LDLCALC, TRIG, CHOLHDL, LDLDIRECT in the last 72 hours. Thyroid Function Tests: No results for input(s): TSH, T4TOTAL, FREET4, T3FREE, THYROIDAB in the last 72 hours. Anemia Panel: No results for input(s): VITAMINB12, FOLATE, FERRITIN, TIBC, IRON, RETICCTPCT in the last 72 hours. Sepsis Labs: No results for input(s): PROCALCITON, LATICACIDVEN in the last 168 hours.  Recent Results (from the past 240 hour(s))  Gastrointestinal Panel by PCR , Stool     Status: None   Collection Time: 01/01/19  3:36 PM  Result Value Ref Range Status   Campylobacter species NOT DETECTED NOT DETECTED Final   Plesimonas shigelloides NOT DETECTED NOT DETECTED Final   Salmonella species NOT DETECTED NOT DETECTED Final   Yersinia enterocolitica NOT DETECTED NOT DETECTED Final   Vibrio species NOT DETECTED NOT DETECTED Final   Vibrio cholerae NOT  DETECTED NOT DETECTED Final   Enteroaggregative E coli (EAEC) NOT DETECTED NOT DETECTED Final   Enteropathogenic E coli (EPEC) NOT DETECTED NOT DETECTED Final   Enterotoxigenic E coli (ETEC) NOT DETECTED NOT DETECTED Final   Shiga like toxin producing E coli (STEC) NOT DETECTED NOT DETECTED Final   E. coli O157 NOT DETECTED NOT DETECTED Final   Shigella/Enteroinvasive E coli (EIEC) NOT DETECTED NOT DETECTED Final   Cryptosporidium NOT DETECTED NOT DETECTED Final   Cyclospora cayetanensis NOT DETECTED NOT DETECTED Final   Entamoeba histolytica NOT DETECTED NOT DETECTED Final   Giardia lamblia NOT DETECTED NOT DETECTED Final   Adenovirus F40/41 NOT DETECTED NOT DETECTED Final   Astrovirus NOT DETECTED NOT DETECTED Final   Norovirus GI/GII NOT DETECTED NOT DETECTED Final   Rotavirus A NOT DETECTED NOT DETECTED Final   Sapovirus (I, II, IV, and V) NOT DETECTED NOT DETECTED Final    Comment: Performed at [facility], [address] [place], [place] [address]  C Difficile Quick Screen w PCR reflex     Status: None   Collection Time: 01/01/19  3:41 PM  Result Value Ref Range Status   C Diff antigen NEGATIVE NEGATIVE Final   C Diff toxin NEGATIVE NEGATIVE Final   C Diff interpretation No C. difficile detected.  Final    Comment: Performed at [facility], [place] [address] [place], [place] [address]  MRSA PCR Screening     Status: None   Collection Time: 01/01/19  3:54 PM  Result Value Ref Range Status   MRSA by PCR NEGATIVE NEGATIVE Final    Comment:        The GeneXpert MRSA Assay (FDA approved for NASAL specimens only), is one component of a comprehensive MRSA colonization surveillance program. It is not intended to diagnose MRSA infection nor to guide or monitor treatment for MRSA infections. Performed at [facility], [place] [address] [place], [place] [address]   Culture, blood (routine x 2)     Status: None (Preliminary result)    Collection Time: 01/04/19  9:00 AM  Result Value Ref Range Status   Specimen Description   Final    BLOOD PORTA CATH Performed at [place] [address], [place], [place] [address]    Special Requests   Final    BOTTLES DRAWN AEROBIC AND ANAEROBIC Blood Culture adequate volume Performed at [facility] [address], [place], [place] [address]    Culture   Final    NO GROWTH 1 DAY Performed at [facility], [place] [address] [place], [place] [address]    Report Status PENDING  Incomplete  Culture, blood (routine x 2)     Status: None (Preliminary result)   Collection Time: 01/04/19 10:09 AM  Result Value Ref Range Status   Specimen Description   Final    BLOOD RIGHT HAND Performed at [place] [address], [place], [place] [address]    Special Requests   Final    BOTTLES DRAWN AEROBIC AND ANAEROBIC Blood Culture adequate volume Performed at [place] [address], [place], [place] [address]    Culture   Final    NO GROWTH 1 DAY Performed at [facility], [place] [address], [place], [place] [address]    Report Status PENDING  Incomplete         Radiology Studies: No results found.      Scheduled Meds: . B-complex with vitamin C  1 tablet Oral Daily  . Chlorhexidine Gluconate Cloth  6 each Topical Daily  . feeding supplement (ENSURE ENLIVE)  237 mL Oral BID BM  . insulin aspart  0-5 Units Subcutaneous QHS  . insulin aspart  0-9 Units Subcutaneous TID WC  . potassium chloride  40 mEq Oral Q4H  . Tbo-filgastrim (GRANIX) SQ  300 mcg Subcutaneous q1800  . zinc sulfate  220 mg Oral BID   Continuous Infusions: . sodium chloride 100 mL/hr at 01/05/19 1544  . ceFEPime (MAXIPIME) IV 2 g (01/06/19 0629)  . vancomycin 1,250 mg (01/06/19 0910)     LOS: 5 days    Time spent: 25  mins.More than 50% of that time was spent in counseling and/or coordination of care.       [name], MD [facility] Pager [phone]  If 7PM-7AM, please contact night-coverage [email or web address] Password [facility] 01/06/2019, 10:38 AM

## 2019-01-07 DIAGNOSIS — L8990 Pressure ulcer of unspecified site, unspecified stage: Secondary | ICD-10-CM

## 2019-01-07 LAB — CBC WITH DIFFERENTIAL/PLATELET
Abs Immature Granulocytes: 0.02 10*3/uL (ref 0.00–0.07)
Basophils Absolute: 0 10*3/uL (ref 0.0–0.1)
Basophils Relative: 1 %
Eosinophils Absolute: 0 10*3/uL (ref 0.0–0.5)
Eosinophils Relative: 1 %
HCT: 22.9 % — ABNORMAL LOW (ref 39.0–52.0)
Hemoglobin: 7.4 g/dL — ABNORMAL LOW (ref 13.0–17.0)
Immature Granulocytes: 1 %
Lymphocytes Relative: 10 %
Lymphs Abs: 0.1 10*3/uL — ABNORMAL LOW (ref 0.7–4.0)
MCH: 30.8 pg (ref 26.0–34.0)
MCHC: 32.3 g/dL (ref 30.0–36.0)
MCV: 95.4 fL (ref 80.0–100.0)
Monocytes Absolute: 0.2 10*3/uL (ref 0.1–1.0)
Monocytes Relative: 13 %
Neutro Abs: 1.1 10*3/uL — ABNORMAL LOW (ref 1.7–7.7)
Neutrophils Relative %: 74 %
Platelets: 41 10*3/uL — ABNORMAL LOW (ref 150–400)
RBC: 2.4 MIL/uL — ABNORMAL LOW (ref 4.22–5.81)
RDW: 19 % — ABNORMAL HIGH (ref 11.5–15.5)
WBC: 1.5 10*3/uL — ABNORMAL LOW (ref 4.0–10.5)
nRBC: 0 % (ref 0.0–0.2)

## 2019-01-07 LAB — BASIC METABOLIC PANEL
Anion gap: 7 (ref 5–15)
BUN: 9 mg/dL (ref 8–23)
CO2: 21 mmol/L — ABNORMAL LOW (ref 22–32)
Calcium: 7.3 mg/dL — ABNORMAL LOW (ref 8.9–10.3)
Chloride: 103 mmol/L (ref 98–111)
Creatinine, Ser: 0.51 mg/dL — ABNORMAL LOW (ref 0.61–1.24)
GFR calc Af Amer: >60 mL/min (ref >60)
GFR calc non Af Amer: >60 mL/min (ref >60)
Glucose, Bld: 110 mg/dL — ABNORMAL HIGH (ref 70–99)
Potassium: 3.2 mmol/L — ABNORMAL LOW (ref 3.5–5.1)
Sodium: 131 mmol/L — ABNORMAL LOW (ref 135–145)

## 2019-01-07 LAB — GLUCOSE, CAPILLARY
Glucose-Capillary: 93 mg/dL (ref 70–99)
Glucose-Capillary: 115 mg/dL — ABNORMAL HIGH (ref 70–99)
Glucose-Capillary: 104 mg/dL — ABNORMAL HIGH (ref 70–99)
Glucose-Capillary: 127 mg/dL — ABNORMAL HIGH (ref 70–99)

## 2019-01-07 LAB — MAGNESIUM: Magnesium: 1.4 mg/dL — ABNORMAL LOW (ref 1.7–2.4)

## 2019-01-07 MED ORDER — MAGNESIUM SULFATE 2 GM/50ML IV SOLN
2.0000 g | Freq: Once | INTRAVENOUS | Status: AC
  Administered 2019-01-07: 2 g via INTRAVENOUS
  Filled 2019-01-07: qty 50

## 2019-01-07 MED ORDER — POTASSIUM CHLORIDE CRYS ER 20 MEQ PO TBCR
40.0000 meq | EXTENDED_RELEASE_TABLET | ORAL | Status: AC
  Administered 2019-01-07 (×2): 40 meq via ORAL
  Filled 2019-01-07 (×2): qty 2

## 2019-01-07 NOTE — Plan of Care (Signed)

## 2019-01-07 NOTE — Progress Notes (Signed)
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  GI, oncology following.    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  Showed marked thickening of the entire colon from cecum to rectum.  GI following.  Started therapy for  radiation colitis with short chain fatty acids enema but has been stopped now due to concern for neutropenic fever.GI planning for restarting. He denies any abdominal pain today.  Tolerating diet.  Passing very  loose stool in the ostomy bag.  Also passing some stool from rectum. GI pathogen panel, C. difficile negative.  There was also concern for viral colitis.  CMV , EBV PCR negative , parvovirus Ab negative.  Fever: Afebrile this  morning.Will follow up blood cultures.NGTD. Started  on broad-spectrum antibiotics with vancomycin and cefepime.  Initiated neutropenic precaution.Will DC vanco.  Dehydration: Continue gentle IV fluids.  Hyponatremia: Improving.  Continue gentle IV fluids.  Hypokalemia/Hypomagnesemia: Supplemented with Mg/potassium.  Pancytopenia: ANC less than 1000.  Started on granix. Oncology following.  Also has severe anemia and thrombocytopenia.  Hb today  7.4. S/P 2 units of PRBC during this hospitalization. Platelets of 41.  Will transfuse with platelets if its less than 20,000.  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  Eliquis stopped due to thrombocytopenia .Will resume when appropriate.  Generalized weakness/deconditioning: Home health recommended after physical therapy evaluation.   Nutrition Problem: Moderate Malnutrition Etiology: chronic illness, catabolic illness, cancer and cancer related treatments      DVT prophylaxis:SCD Code Status: Full Family Communication: None present at the bedside Disposition Plan: Home after stabilization of neutropenia, radiation colitis   Consultants: GI, oncology  Procedures: None  Antimicrobials:  Anti-infectives (From admission, onward)   Start     Dose/Rate Route Frequency Ordered Stop   01/04/19 2200  vancomycin (VANCOCIN) 1,250 mg in sodium chloride 0.9 % 250 mL IVPB  Status:  Discontinued     1,250 mg 166.7 mL/hr over 90 Minutes Intravenous Every 12 hours 01/04/19 0819 01/07/19 0753   01/04/19 0900  vancomycin (VANCOCIN) 1,750 mg in sodium chloride 0.9 % 500 mL IVPB     1,750 mg 250 mL/hr over 120 Minutes Intravenous  Once 01/04/19 0819 01/04/19 1131   01/04/19 0830  ceFEPIme (MAXIPIME) 2 g in sodium chloride 0.9 % 100 mL IVPB     2 g 200 mL/hr over 30 Minutes Intravenous Every 8 hours 01/04/19 0815        Subjective: Patient seen and examined at bedside this morning.  Hemodynamically stable.  Afebrile today.  Denies any abdominal pain.  Tolerating diet.   Objective: Vitals:   01/06/19 1400 01/06/19 1500 01/06/19 2200 01/07/19 0505  BP:  122/73 122/75 120/73  Pulse: [name])  130 (!) 124 (!) 110   Resp:   20 18  Temp:  99.3 F (37.4 C) 100 F (37.8 C) 98.8 F (37.1 C)  TempSrc:  Oral Oral Oral  SpO2:  94% 95% 95%  Weight:      Height:        Intake/Output Summary (Last 24 hours) at 01/07/2019 1030 Last data filed at 01/07/2019 1007 Gross per 24 hour  Intake 1788 ml   Output 2875 ml  Net -1087 ml   Filed Weights   01/01/19 1052 01/02/19 2050  Weight: 90.7 kg 86.3 kg    Examination:  General exam: Appears calm and comfortable ,Not in distress,average built HEENT:PERRL,Oral mucosa moist, Ear/Nose normal on gross exam Respiratory system: Bilateral equal air entry, normal vesicular breath sounds, no wheezes or crackles  Cardiovascular system: S1 & S2 heard, RRR. No JVD, murmurs, rubs, gallops or clicks.  Chemo-Port on the right chest Gastrointestinal system: Abdomen is nondistended, soft and nontender. No organomegaly or masses felt. Normal bowel sounds heard. Ileostomy bag filled with brown liquid stool. Central nervous system: Alert and oriented. No focal neurological deficits. Extremities: No edema, no clubbing ,no cyanosis, distal peripheral pulses palpable. Skin: No rashes, lesions or ulcers,no icterus ,no pallor   Data Reviewed: I have personally reviewed following labs and imaging studies  CBC: Recent Labs  Lab 01/01/19 1120  01/03/19 0840 01/04/19 0451 01/05/19 0314 01/05/19 1824 01/06/19 0423 01/07/19 0535  WBC 2.3*   < > 1.6* 1.0* 1.1*  --  1.5* 1.5*  NEUTROABS 1.9  --   --  0.8* 0.8*  --  1.1* 1.1*  HGB 9.3*   < > 7.8* 6.8* 6.9* 7.7* 7.5* 7.4*  HCT 26.6*   < > 23.7* 20.8* 21.2* 23.3* 22.5* 22.9*  MCV 94.0   < > 97.9 99.0 94.6  --  93.4 95.4  PLT 105*   < > 67* 51* 47*  --  42* 41*   < > = values in this interval not displayed.   Basic Metabolic Panel: Recent Labs  Lab 01/01/19 1120  01/03/19 0840 01/04/19 0451 01/05/19 0314 01/05/19 1400 01/06/19 0423 01/07/19 0535 01/07/19 0811  NA 120*   < > 129* 130* 131*  --  131* 131*  --   K 3.1*   < > 3.3* 3.3* 3.0*  --  3.1* 3.2*  --   CL 76*   < > 97* 100 103  --  103 103  --   CO2 26   < > 21* 23 21*  --  21* 21*  --   GLUCOSE 218*   < > 119* 106* 110*  --  104* 110*  --   BUN 29*   < > 14 11 8   --  8 9  --   CREATININE 1.07   < > 0.68 0.61 0.53*  --  0.52* 0.51*  --    CALCIUM 8.1*   < > 7.5* 7.4* 7.2*  --  7.1* 7.3*  --   MG 2.0  --   --   --  1.2*  --   --   --  1.4*  PHOS  --   --   --   --   --  2.0*  --   --   --    < > = values in this interval not displayed.   GFR: Estimated Creatinine Clearance: 104.1 mL/min (A) (by C-G formula based on SCr of 0.51 mg/dL (L)). Liver Function Tests: Recent  Labs  Lab 01/01/19 1120 01/02/19 0345  AST 59* 36  ALT 69* 53*  ALKPHOS 120 88  BILITOT 1.4* 0.8  PROT 6.3* 4.8*  ALBUMIN 2.5* 1.9*   No results for input(s): LIPASE, AMYLASE in the last 168 hours. No results for input(s): AMMONIA in the last 168 hours. Coagulation Profile: No results for input(s): INR, PROTIME in the last 168 hours. Cardiac Enzymes: No results for input(s): CKTOTAL, CKMB, CKMBINDEX, TROPONINI in the last 168 hours. BNP (last 3 results) No results for input(s): PROBNP in the last 8760 hours. HbA1C: No results for input(s): HGBA1C in the last 72 hours. CBG: Recent Labs  Lab 01/06/19 0736 01/06/19 1107 01/06/19 1709 01/06/19 2159 01/07/19 0733  GLUCAP 109* 187* 140* 98 93   Lipid Profile: No results for input(s): CHOL, HDL, LDLCALC, TRIG, CHOLHDL, LDLDIRECT in the last 72 hours. Thyroid Function Tests: No results for input(s): TSH, T4TOTAL, FREET4, T3FREE, THYROIDAB in the last 72 hours. Anemia Panel: No results for input(s): VITAMINB12, FOLATE, FERRITIN, TIBC, IRON, RETICCTPCT in the last 72 hours. Sepsis Labs: No results for input(s): PROCALCITON, LATICACIDVEN in the last 168 hours.  Recent Results (from the past 240 hour(s))  Gastrointestinal Panel by PCR , Stool     Status: None   Collection Time: 01/01/19  3:36 PM  Result Value Ref Range Status   Campylobacter species NOT DETECTED NOT DETECTED Final   Plesimonas shigelloides NOT DETECTED NOT DETECTED Final   Salmonella species NOT DETECTED NOT DETECTED Final   Yersinia enterocolitica NOT DETECTED NOT DETECTED Final   Vibrio species NOT DETECTED NOT DETECTED Final    Vibrio cholerae NOT DETECTED NOT DETECTED Final   Enteroaggregative E coli (EAEC) NOT DETECTED NOT DETECTED Final   Enteropathogenic E coli (EPEC) NOT DETECTED NOT DETECTED Final   Enterotoxigenic E coli (ETEC) NOT DETECTED NOT DETECTED Final   Shiga like toxin producing E coli (STEC) NOT DETECTED NOT DETECTED Final   E. coli O157 NOT DETECTED NOT DETECTED Final   Shigella/Enteroinvasive E coli (EIEC) NOT DETECTED NOT DETECTED Final   Cryptosporidium NOT DETECTED NOT DETECTED Final   Cyclospora cayetanensis NOT DETECTED NOT DETECTED Final   Entamoeba histolytica NOT DETECTED NOT DETECTED Final   Giardia lamblia NOT DETECTED NOT DETECTED Final   Adenovirus F40/41 NOT DETECTED NOT DETECTED Final   Astrovirus NOT DETECTED NOT DETECTED Final   Norovirus GI/GII NOT DETECTED NOT DETECTED Final   Rotavirus A NOT DETECTED NOT DETECTED Final   Sapovirus (I, II, IV, and V) NOT DETECTED NOT DETECTED Final    Comment: Performed at [facility], [address] [place], [place] [address]  C Difficile Quick Screen w PCR reflex     Status: None   Collection Time: 01/01/19  3:41 PM  Result Value Ref Range Status   C Diff antigen NEGATIVE NEGATIVE Final   C Diff toxin NEGATIVE NEGATIVE Final   C Diff interpretation No C. difficile detected.  Final    Comment: Performed at [facility], [place] [address], [place], [place] [address]  MRSA PCR Screening     Status: None   Collection Time: 01/01/19  3:54 PM  Result Value Ref Range Status   MRSA by PCR NEGATIVE NEGATIVE Final    Comment:        The GeneXpert MRSA Assay (FDA approved for NASAL specimens only), is one component of a comprehensive MRSA colonization surveillance program. It is not intended to diagnose MRSA infection nor to guide or monitor  treatment for MRSA infections. Performed at [facility], [place] [address] [place], [place] [address]   Culture, blood (routine x 2)     Status: None  (Preliminary result)   Collection Time: 01/04/19  9:00 AM  Result Value Ref Range Status   Specimen Description   Final    BLOOD PORTA CATH Performed at [facility] [address], [place], [place] [address]    Special Requests   Final    BOTTLES DRAWN AEROBIC AND ANAEROBIC Blood Culture adequate volume Performed at [facility] [address], [place], [place] [address]    Culture   Final    NO GROWTH 3 DAYS Performed at [facility], [place] [address] [place], [place] [address]    Report Status PENDING  Incomplete  Culture, blood (routine x 2)     Status: None (Preliminary result)   Collection Time: 01/04/19 10:09 AM  Result Value Ref Range Status   Specimen Description   Final    BLOOD RIGHT HAND Performed at [facility] [address], [place], [place] [address]    Special Requests   Final    BOTTLES DRAWN AEROBIC AND ANAEROBIC Blood Culture adequate volume Performed at [place] [address], [place], [place] [address]    Culture   Final    NO GROWTH 3 DAYS Performed at [facility], [place] [address], [place],  [address]    Report Status PENDING  Incomplete         Radiology Studies: No results found.      Scheduled Meds: . B-complex with vitamin C  1 tablet Oral Daily  . feeding supplement (ENSURE ENLIVE)  237 mL Oral BID BM  . insulin aspart  0-5 Units Subcutaneous QHS  . insulin aspart  0-9 Units Subcutaneous TID WC  . potassium chloride  40 mEq Oral Q4H  . Tbo-filgastrim (GRANIX) SQ  300 mcg Subcutaneous q1800  . zinc sulfate  220 mg Oral BID   Continuous Infusions: . sodium chloride 75 mL/hr at 01/07/19 0948  . ceFEPime (MAXIPIME) IV 2 g (01/07/19 0503)     LOS: 6 days    Time spent: 25  mins.More than 50% of that time was spent in counseling and/or coordination of care.      [name], MD [facility] Pager [phone]  If  7PM-7AM, please contact night-coverage [email or web address] Password [email or web address] 01/07/2019, 10:30 AM

## 2019-01-07 NOTE — Progress Notes (Signed)
   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, status post chemotherapy and just having completed radiation therapy to the abdomen which included a wide field of bowel Patient admitted with confusion, progressive weakness, anorexia, increased ostomy output and bloody mucoid stool from the rectum  Patient status post loop ileostomy December 2019 for 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 This may be secondary to acute colitis,however with neutropenia also concern for bacteremia. Blood cultures pending  No fever today  Gatha Mayer, MD, [facility] 01/07/2019 11:29 AM Pager [phone]

## 2019-01-07 NOTE — Progress Notes (Signed)
Pharmacy Antibiotic Note  Mark Frey is a 63 y.o. male with PMH Burkitt's Lymphoma s/p chemo/rads and diverting loop ileostomy, admitted on 01/01/2019 with increased ostomy output and blood/mucus from rectum d/t radiation colitis. On 4/23, patient noted to have new fevers as well as low WBC and ANC 800.   Pharmacy consulted for cefepime dosing.   Today, 01/07/19  WBC 1.5, ANC 1.1 remain low. On Granix per oncology.  SCr - stable, CrCl > 60 mL/min  Afebrile  Day #4 of IV antibiotics  Plan:  Continue cefepime 2 g IV q 8 hr  Follow cultures and renal function  Height: 5' 9"  (175.3 cm) Weight: 190 lb 4.1 oz (86.3 kg) IBW/kg (Calculated) : 70.7  Temp (24hrs), Avg:99.4 F (37.4 C), Min:98.8 F (37.1 C), Max:100 F (37.8 C)  Recent Labs  Lab 01/03/19 0840 01/04/19 0451 01/05/19 0314 01/06/19 0423 01/07/19 0535  WBC 1.6* 1.0* 1.1* 1.5* 1.5*  CREATININE 0.68 0.61 0.53* 0.52* 0.51*    Estimated Creatinine Clearance: 104.1 mL/min (A) (by C-G formula based on SCr of 0.51 mg/dL (L)).    Allergies  Allergen Reactions  . Ciprofloxacin Other (See Comments)    Leg tingling    Antimicrobials this admission: 4/23 Vanc >> 4/26 4/23 Cefepime >>   Dose adjustments this admission: n/a  Microbiology results: 4/20 C.diff: negative 4/20 GI panel: negative 4/20 MRSA PCR: negative 4/23 BCx: no growth 3 days   Thank you for allowing pharmacy to be a part of this patient's care.  Lenis Noon, PharmD 01/07/19 9:08 AM

## 2019-01-08 DIAGNOSIS — E43 Unspecified severe protein-calorie malnutrition: Secondary | ICD-10-CM

## 2019-01-08 LAB — TYPE AND SCREEN
ABO/RH(D): O POS
Antibody Screen: NEGATIVE
Unit division: 0
Unit division: 0
Unit division: 0

## 2019-01-08 LAB — CBC WITH DIFFERENTIAL/PLATELET
Abs Immature Granulocytes: 0.02 10*3/uL (ref 0.00–0.07)
Basophils Absolute: 0 10*3/uL (ref 0.0–0.1)
Basophils Relative: 1 %
Eosinophils Absolute: 0 10*3/uL (ref 0.0–0.5)
Eosinophils Relative: 1 %
HCT: 20.7 % — ABNORMAL LOW (ref 39.0–52.0)
Hemoglobin: 6.7 g/dL — CL (ref 13.0–17.0)
Immature Granulocytes: 2 %
Lymphocytes Relative: 8 %
Lymphs Abs: 0.1 10*3/uL — ABNORMAL LOW (ref 0.7–4.0)
MCH: 31 pg (ref 26.0–34.0)
MCHC: 32.4 g/dL (ref 30.0–36.0)
MCV: 95.8 fL (ref 80.0–100.0)
Monocytes Absolute: 0.2 10*3/uL (ref 0.1–1.0)
Monocytes Relative: 13 %
Neutro Abs: 1 10*3/uL — ABNORMAL LOW (ref 1.7–7.7)
Neutrophils Relative %: 75 %
Platelets: 37 10*3/uL — ABNORMAL LOW (ref 150–400)
RBC: 2.16 MIL/uL — ABNORMAL LOW (ref 4.22–5.81)
RDW: 19 % — ABNORMAL HIGH (ref 11.5–15.5)
WBC: 1.3 10*3/uL — CL (ref 4.0–10.5)
nRBC: 0 % (ref 0.0–0.2)

## 2019-01-08 LAB — GLUCOSE, CAPILLARY
Glucose-Capillary: 120 mg/dL — ABNORMAL HIGH (ref 70–99)
Glucose-Capillary: 168 mg/dL — ABNORMAL HIGH (ref 70–99)
Glucose-Capillary: 105 mg/dL — ABNORMAL HIGH (ref 70–99)
Glucose-Capillary: 123 mg/dL — ABNORMAL HIGH (ref 70–99)

## 2019-01-08 LAB — BPAM RBC
Blood Product Expiration Date: 202005012359
Blood Product Expiration Date: 202005012359
Blood Product Expiration Date: 202005072359
ISSUE DATE / TIME: 202004231035
ISSUE DATE / TIME: 202004241259
Unit Type and Rh: 5100
Unit Type and Rh: 5100
Unit Type and Rh: 5100

## 2019-01-08 LAB — DIC (DISSEMINATED INTRAVASCULAR COAGULATION)PANEL
D-Dimer, Quant: 2.48 ug/mL-FEU — ABNORMAL HIGH (ref 0.00–0.50)
Fibrinogen: >800 mg/dL — ABNORMAL HIGH (ref 210–475)
INR: 1.2 (ref 0.8–1.2)
Platelets: 37 10*3/uL — ABNORMAL LOW (ref 150–400)
Smear Review: NONE SEEN
aPTT: 26 seconds (ref 24–36)

## 2019-01-08 LAB — BASIC METABOLIC PANEL
Anion gap: 8 (ref 5–15)
BUN: 8 mg/dL (ref 8–23)
CO2: 20 mmol/L — ABNORMAL LOW (ref 22–32)
Calcium: 7.1 mg/dL — ABNORMAL LOW (ref 8.9–10.3)
Chloride: 103 mmol/L (ref 98–111)
Creatinine, Ser: 0.49 mg/dL — ABNORMAL LOW (ref 0.61–1.24)
GFR calc Af Amer: >60 mL/min (ref >60)
GFR calc non Af Amer: >60 mL/min (ref >60)
Glucose, Bld: 120 mg/dL — ABNORMAL HIGH (ref 70–99)
Potassium: 3.3 mmol/L — ABNORMAL LOW (ref 3.5–5.1)
Sodium: 131 mmol/L — ABNORMAL LOW (ref 135–145)

## 2019-01-08 LAB — PREPARE RBC (CROSSMATCH)

## 2019-01-08 LAB — DIC (DISSEMINATED INTRAVASCULAR COAGULATION) PANEL: Prothrombin Time: 15.1 seconds (ref 11.4–15.2)

## 2019-01-08 LAB — MAGNESIUM: Magnesium: 1.7 mg/dL (ref 1.7–2.4)

## 2019-01-08 LAB — PROCALCITONIN: Procalcitonin: 0.2 ng/mL

## 2019-01-08 MED ORDER — TBO-FILGRASTIM 480 MCG/0.8ML ~~LOC~~ SOSY
480.0000 ug | PREFILLED_SYRINGE | Freq: Every day | SUBCUTANEOUS | Status: DC
  Administered 2019-01-08 – 2019-01-12 (×5): 480 ug via SUBCUTANEOUS
  Filled 2019-01-08 (×5): qty 0.8

## 2019-01-08 MED ORDER — SODIUM CHLORIDE 0.9% IV SOLUTION
Freq: Once | INTRAVENOUS | Status: AC
  Administered 2019-01-08: 05:00:00 via INTRAVENOUS

## 2019-01-08 MED ORDER — METRONIDAZOLE IN NACL 5-0.79 MG/ML-% IV SOLN
500.0000 mg | Freq: Three times a day (TID) | INTRAVENOUS | Status: DC
  Administered 2019-01-08 – 2019-01-11 (×9): 500 mg via INTRAVENOUS
  Filled 2019-01-08 (×9): qty 100

## 2019-01-08 MED ORDER — POTASSIUM CHLORIDE CRYS ER 20 MEQ PO TBCR
40.0000 meq | EXTENDED_RELEASE_TABLET | Freq: Once | ORAL | Status: AC
  Administered 2019-01-08: 40 meq via ORAL
  Filled 2019-01-08: qty 2

## 2019-01-08 MED ORDER — CHOLESTYRAMINE LIGHT 4 G PO PACK
4.0000 g | PACK | ORAL | Status: DC
  Administered 2019-01-08 – 2019-01-14 (×5): 4 g via ORAL
  Filled 2019-01-08 (×26): qty 1

## 2019-01-08 NOTE — Progress Notes (Addendum)
CRITICAL VALUE ALERT  Critical Value: WBC 1.3 (down from prev. value of 1.5)                         Hgb  6.7 (down from prev. value of 7.4)                         Date & Time Notied:  4/27  0305  Provider Notified:  Silas [place]  Orders Received/Actions taken:   Orders received to obtain 1 unit PRBC's & transfuse. T&S to be obtained as previous value recently expired.

## 2019-01-08 NOTE — Progress Notes (Addendum)
Progress Note    ASSESSMENT AND PLAN:   49. 63 yo male with Burkitt's lymphoma, s/p loop ileostomy Dec 2019 for extrinsic compression of sigmoid secondary to lymphoma mass. He is s/p chemo and recent completion of radiation to abdomen admitted with increased ostomy output and bloody mucoid stool from rectum. Infectious workup negative. CT scan c/w acute diffuse colitis,  maybe radiation vrs diversion colitis. Was better on short chain fatty acid enemas but stopped due to neutropenia.  -No further rectal bleeding. For high ostomy output will add cholestyramine. Another options if fails to improve include VSL #3   2.  Fever, Better. Tmax 99.7  3. Pancytopenia, getting another unit of blood now for hgb of 6.7   SUBJECTIVE    No further bleeding.    OBJECTIVE:     Vital signs in last 24 hours: Temp:  [98 F (36.7 C)-99.7 F (37.6 C)] 99.7 F (37.6 C) (04/27 0909) Pulse Rate:  [102-117] 104 (04/27 0930) Resp:  [18-20] 18 (04/27 0930) BP: (111-120)/(70-75) 120/70 (04/27 0930) SpO2:  [94 %-96 %] 96 % (04/27 0930) Last BM Date: 01/08/19 General:   Alert, well-developed male in NAD EENT:  Normal hearing, non icteric sclera, conjunctive pink.  Heart:  Regular rate and rhythm; no murmur.  No lower extremity edema   Pulm: Normal respiratory effort Abdomen:  Soft, nondistended, nontender.  Normal bowel sounds,.       Neurologic:  Alert and  oriented x4;  grossly normal neurologically. Psych:  Pleasant, cooperative.  Normal mood and affect.   Intake/Output from previous day: 04/26 0701 - 04/27 0700 In: 1275.7 [P.O.:180; I.V.:1095.7] Out: 2325 [Urine:350; Stool:1975] Intake/Output this shift: Total I/O In: 972.6 [I.V.:972.6] Out: 250 [Urine:250]  Lab Results: Recent Labs    01/06/19 0423 01/07/19 0535 01/08/19 0212  WBC 1.5* 1.5* 1.3*  HGB 7.5* 7.4* 6.7*  HCT 22.5* 22.9* 20.7*  PLT 42* 41* 37*   BMET Recent Labs    01/06/19 0423 01/07/19 0535 01/08/19 0212   NA 131* 131* 131*  K 3.1* 3.2* 3.3*  CL 103 103 103  CO2 21* 21* 20*  GLUCOSE 104* 110* 120*  BUN 8 9 8   CREATININE 0.52* 0.51* 0.49*  CALCIUM 7.1* 7.3* 7.1*      Principal Problem:   Hyponatremia Active Problems:   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HCC)   Hypokalemia   Diarrhea   Severe protein-calorie malnutrition (HCC)   Pancytopenia (HCC)   Hematochezia   Radiation gastroenteritis   Radiation colitis   Malnutrition of moderate degree   Zinc deficiency   Pressure injury of skin     LOS: 7 days   Tye Savoy ,NP 01/08/2019, 10:48 AM     Attending physician's note   I have taken an interval history, reviewed the chart and examined the patient. I agree with the Advanced Practitioner's note, impression and recommendations.   63 year old male with Burkitt's lymphoma status post loop ileostomy December 2019  Diffuse colitis possible radiation/chemo induced versus diversion colitis or neutropenic enterocolitis( typhlitis)  Pancytopenic, WBC count remains low at 1.3 with neutropenia.  Absolute neutrophil count less than thousand.  Low Grade fever, blood culture no growth to date. No rectal bleeding   Continue broad-spectrum antibiotics Consider additional anaerobic coverage  Do not recommend enema or probiotics at this point given neutropenia  Can consider trial of cholestyramine 2-3 times daily to decrease ileostomy output      K. Denzil Magnuson , MD [phone]

## 2019-01-08 NOTE — Progress Notes (Signed)
PT Cancellation Note  Patient Details Name: MASAMI PLATA MRN: [id number] DOB: December 29, 1955   Cancelled Treatment:     Pt declined no reason other than "didn't want to".  Pt has been evaluated and was able to amb 150.  He has also been seen in out OP PT Clinic showing progress.  Pt plans to return home.    Rica Koyanagi  PTA Acute  Rehabilitation Services Pager      [phone] Office      [phone]

## 2019-01-08 NOTE — Progress Notes (Signed)
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  GI, oncology following.    Assessment & Plan:   Principal Problem:   Hyponatremia Active Problems:   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HCC)   Hypokalemia   Diarrhea   Severe protein-calorie malnutrition (HCC)   Pancytopenia (HCC)   Hematochezia   Radiation gastroenteritis   Radiation colitis   Malnutrition of moderate degree   Zinc deficiency   Pressure injury of skin  Radiation colitis:  CT scan showed marked thickening of the entire colon from cecum to rectum.  GI following.  Started therapy for  radiation colitis with short chain fatty acids enema but has been stopped now due to concern for neutropenic fever. He denies any abdominal pain today.  Tolerating diet.  Passing very  loose stool in the ostomy bag.  Also passing some stool from rectum.GI starting on cholestyramine. GI pathogen panel, C. difficile negative.  There was also concern for viral colitis.  CMV , EBV PCR negative , parvovirus Ab negative.  Fever: Tmax of 99.7.Will follow up blood cultures.NGTD. Started  on broad-spectrum antibiotics with vancomycin and cefepime.  Initiated neutropenic precaution.DCed vanco and added flagyl for anaerobic coverage.  Dehydration: Continue gentle IV fluids.  Hyponatremia: Improving.  Continue gentle IV fluids.  Hypokalemia/Hypomagnesemia: Supplemented with Mg/potassium.  Pancytopenia: ANC less than 1000.  On granix. Oncology following.  Also has severe anemia and thrombocytopenia.   Hb dropped to 6.7 this morning.Being again transfused with 1 unit today. S/P 3 units total  of PRBCs during this hospitalization. Platelets of 85631.  Will transfuse with platelets if its less than 20,000.  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  Eliquis stopped due to thrombocytopenia .Will resume when appropriate.  Generalized weakness/deconditioning: Home health recommended after physical therapy evaluation.   Nutrition Problem: Moderate Malnutrition Etiology: chronic illness, catabolic illness, cancer and cancer related treatments      DVT prophylaxis:SCD Code Status: Full Family Communication: None present at the bedside Disposition Plan: Home after stabilization of neutropenia/radiation colitis   Consultants: GI, oncology  Procedures: None  Antimicrobials:  Anti-infectives (From admission, onward)   Start     Dose/Rate Route Frequency Ordered Stop   01/08/19 1130  metroNIDAZOLE (FLAGYL) IVPB 500 mg     500 mg 100 mL/hr over 60 Minutes Intravenous Every 8 hours 01/08/19 1119     01/04/19 2200  vancomycin (VANCOCIN) 1,250 mg in sodium chloride 0.9 % 250 mL IVPB  Status:  Discontinued     1,250 mg 166.7 mL/hr over 90 Minutes Intravenous Every 12 hours 01/04/19 0819 01/07/19 0753   01/04/19 0900  vancomycin (VANCOCIN) 1,750 mg in sodium chloride 0.9 % 500 mL IVPB     1,750 mg 250 mL/hr over 120 Minutes Intravenous  Once 01/04/19 0819 01/04/19 1131   01/04/19 0830  ceFEPIme (MAXIPIME) 2 g in sodium chloride 0.9 % 100 mL IVPB     2 g 200 mL/hr over 30 Minutes Intravenous Every 8 hours 01/04/19 0815        Subjective: Patient seen and examined the  bedside this morning.  Remains hemodynamically stable.  T-max of 100.7.  Denies any abdominal pain, nausea or vomiting.  Having loose stool output in the ileostomy bag.No new complains.Was Lind Covert when can he go home.  Objective: Vitals:   01/07/19 2033 01/08/19 0339 01/08/19 0909 01/08/19 0930  BP: 115/73 117/71 119/75 120/70   Pulse: (!) 111 (!) 102 (!) 105 (!) 104  Resp: 18 18 18 18   Temp: 98 F (36.7 C) 98.3 F (36.8 C) 99.7 F (37.6 C)   TempSrc:  Oral Oral Oral  SpO2: 95% 95% 95% 96%  Weight:      Height:        Intake/Output Summary (Last 24 hours) at 01/08/2019 1122 Last data filed at 01/08/2019 1045 Gross per 24 hour  Intake 2068.24 ml  Output 1875 ml  Net 193.24 ml   Filed Weights   01/01/19 1052 01/02/19 2050  Weight: 90.7 kg 86.3 kg    Examination:  General exam: Appears calm and comfortable ,Not in distress,average built HEENT:PERRL,Oral mucosa moist, Ear/Nose normal on gross exam Respiratory system: Bilateral equal air entry, normal vesicular breath sounds, no wheezes or crackles  Cardiovascular system: S1 & S2 heard, RRR. No JVD, murmurs, rubs, gallops or clicks. Gastrointestinal system: Abdomen is nondistended, soft and nontender. No organomegaly or masses felt. Normal bowel sounds heard. Ileostomy Central nervous system: Alert and oriented. No focal neurological deficits. Extremities: No edema, no clubbing ,no cyanosis, distal peripheral pulses palpable. Skin: No rashes, lesions or ulcers,no icterus ,no pallor  Data Reviewed: I have personally reviewed following labs and imaging studies  CBC: Recent Labs  Lab 01/04/19 0451 01/05/19 0314 01/05/19 1824 01/06/19 0423 01/07/19 0535 01/08/19 0212  WBC 1.0* 1.1*  --  1.5* 1.5* 1.3*  NEUTROABS 0.8* 0.8*  --  1.1* 1.1* 1.0*  HGB 6.8* 6.9* 7.7* 7.5* 7.4* 6.7*  HCT 20.8* 21.2* 23.3* 22.5* 22.9* 20.7*  MCV 99.0 94.6  --  93.4 95.4 95.8  PLT 51* 47*  --  42* 41* 37*   Basic Metabolic Panel: Recent Labs  Lab 01/04/19 0451 01/05/19 0314 01/05/19 1400 01/06/19 0423 01/07/19 0535 01/07/19 0811 01/08/19 0212  NA 130* 131*  --  131* 131*  --  131*  K 3.3* 3.0*  --  3.1* 3.2*  --  3.3*  CL 100 103  --  103 103  --  103  CO2 23 21*  --  21* 21*  --  20*  GLUCOSE 106* 110*  --  104* 110*  --  120*  BUN 11 8  --  8 9  --  8   CREATININE 0.61 0.53*  --  0.52* 0.51*  --  0.49*  CALCIUM 7.4* 7.2*  --  7.1* 7.3*  --  7.1*  MG  --  1.2*  --   --   --  1.4* 1.7  PHOS  --   --  2.0*  --   --   --   --    GFR: Estimated Creatinine Clearance: 104.1 mL/min (A) (by C-G formula based on SCr of 0.49 mg/dL (L)). Liver Function Tests: Recent Labs  Lab 01/02/19 0345  AST 36  ALT 53*  ALKPHOS 88  BILITOT 0.8  PROT 4.8*  ALBUMIN 1.9*   No results for input(s): LIPASE, AMYLASE in the last 168 hours. No results for input(s): AMMONIA in the last 168 hours. Coagulation Profile: No results for input(s): INR, PROTIME in the last 168 hours. Cardiac Enzymes: No results for input(s):  CKTOTAL, CKMB, CKMBINDEX, TROPONINI in the last 168 hours. BNP (last 3 results) No results for input(s): PROBNP in the last 8760 hours. HbA1C: No results for input(s): HGBA1C in the last 72 hours. CBG: Recent Labs  Lab 01/07/19 0733 01/07/19 1219 01/07/19 1619 01/07/19 2029 01/08/19 0740  GLUCAP 93 115* 104* 127* 120*   Lipid Profile: No results for input(s): CHOL, HDL, LDLCALC, TRIG, CHOLHDL, LDLDIRECT in the last 72 hours. Thyroid Function Tests: No results for input(s): TSH, T4TOTAL, FREET4, T3FREE, THYROIDAB in the last 72 hours. Anemia Panel: No results for input(s): VITAMINB12, FOLATE, FERRITIN, TIBC, IRON, RETICCTPCT in the last 72 hours. Sepsis Labs: No results for input(s): PROCALCITON, LATICACIDVEN in the last 168 hours.  Recent Results (from the past 240 hour(s))  Gastrointestinal Panel by PCR , Stool     Status: None   Collection Time: 01/01/19  3:36 PM  Result Value Ref Range Status   Campylobacter species NOT DETECTED NOT DETECTED Final   Plesimonas shigelloides NOT DETECTED NOT DETECTED Final   Salmonella species NOT DETECTED NOT DETECTED Final   Yersinia enterocolitica NOT DETECTED NOT DETECTED Final   Vibrio species NOT DETECTED NOT DETECTED Final   Vibrio cholerae NOT DETECTED NOT DETECTED Final    Enteroaggregative E coli (EAEC) NOT DETECTED NOT DETECTED Final   Enteropathogenic E coli (EPEC) NOT DETECTED NOT DETECTED Final   Enterotoxigenic E coli (ETEC) NOT DETECTED NOT DETECTED Final   Shiga like toxin producing E coli (STEC) NOT DETECTED NOT DETECTED Final   E. coli O157 NOT DETECTED NOT DETECTED Final   Shigella/Enteroinvasive E coli (EIEC) NOT DETECTED NOT DETECTED Final   Cryptosporidium NOT DETECTED NOT DETECTED Final   Cyclospora cayetanensis NOT DETECTED NOT DETECTED Final   Entamoeba histolytica NOT DETECTED NOT DETECTED Final   Giardia lamblia NOT DETECTED NOT DETECTED Final   Adenovirus F40/41 NOT DETECTED NOT DETECTED Final   Astrovirus NOT DETECTED NOT DETECTED Final   Norovirus GI/GII NOT DETECTED NOT DETECTED Final   Rotavirus A NOT DETECTED NOT DETECTED Final   Sapovirus (I, II, IV, and V) NOT DETECTED NOT DETECTED Final    Comment: Performed at [facility], [address] [place], [place] [address]  C Difficile Quick Screen w PCR reflex     Status: None   Collection Time: 01/01/19  3:41 PM  Result Value Ref Range Status   C Diff antigen NEGATIVE NEGATIVE Final   C Diff toxin NEGATIVE NEGATIVE Final   C Diff interpretation No C. difficile detected.  Final    Comment: Performed at [facility], [place] [address], [place], [place] [address]  MRSA PCR Screening     Status: None   Collection Time: 01/01/19  3:54 PM  Result Value Ref Range Status   MRSA by PCR NEGATIVE NEGATIVE Final    Comment:        The GeneXpert MRSA Assay (FDA approved for NASAL specimens only), is one component of a comprehensive MRSA colonization surveillance program. It is not intended to diagnose MRSA infection nor to guide or monitor treatment for MRSA infections. Performed at [facility], [place] [address], [place], [place] [address]   Culture, blood (routine x 2)     Status: None (Preliminary result)   Collection Time: 01/04/19  9:00  AM  Result Value Ref Range Status   Specimen Description   Final    BLOOD PORTA CATH Performed at [facility] [address], [place], [place] [address]  Special Requests   Final    BOTTLES DRAWN AEROBIC AND ANAEROBIC Blood Culture adequate volume Performed at [facility] [address] [place], [place] [address]    Culture   Final    NO GROWTH 4 DAYS Performed at [facility], [place] [address] [place], [place] [address]    Report Status PENDING  Incomplete  Culture, blood (routine x 2)     Status: None (Preliminary result)   Collection Time: 01/04/19 10:09 AM  Result Value Ref Range Status   Specimen Description   Final    BLOOD RIGHT HAND Performed at [facility] [address], [place], [place] [address]    Special Requests   Final    BOTTLES DRAWN AEROBIC AND ANAEROBIC Blood Culture adequate volume Performed at [facility] [address] [place], [place] [address]    Culture   Final    NO GROWTH 4 DAYS Performed at [facility], [place] [address], [place], [place] [address]    Report Status PENDING  Incomplete         Radiology Studies: No results found.      Scheduled Meds: . B-complex with vitamin C  1 tablet Oral Daily  . cholestyramine  4 g Oral TID  . feeding supplement (ENSURE ENLIVE)  237 mL Oral BID BM  . insulin aspart  0-5 Units Subcutaneous QHS  . insulin aspart  0-9 Units Subcutaneous TID WC  . Tbo-filgastrim (GRANIX) SQ  480 mcg Subcutaneous q1800  . zinc sulfate  220 mg Oral BID   Continuous Infusions: . sodium chloride Stopped (01/08/19 0915)  . ceFEPime (MAXIPIME) IV 2 g (01/08/19 0537)  . metronidazole       LOS: 7 days    Time spent: 25  mins.More than 50% of that time was spent in counseling and/or coordination of care.      [name], MD [facility] Pager [phone]  If 7PM-7AM, please contact night-coverage  [email or web address] Password [facility] 01/08/2019, 11:22 AM

## 2019-01-08 NOTE — TOC Initial Note (Signed)
Transition of Care [facility]) - Initial/Assessment Note    Patient Details  Name: Mark Frey MRN: [id number] Date of Birth: [birth year]  Transition of Care [facility]) CM/SW Contact:    Purcell Mouton, RN Phone Number: 01/08/2019, 10:37 AM  Clinical Narrative:  Pt a little confused. Will continue to follow for discharge.                  Expected Discharge Plan: [place]     Patient Goals and CMS Choice     Choice offered to / list presented to : Spouse  Expected Discharge Plan and Services Expected Discharge Plan: [place]   Discharge Planning Services: CM Consult Post Acute Care Choice: [place] arrangements for the past 2 months: Single Family Home Expected Discharge Date: (unknown)                                    Prior Living Arrangements/Services Living arrangements for the past 2 months: Single Family Home Lives with:: Spouse              Current home services: Home PT    Activities of Daily Living Home Assistive Devices/Equipment: CBG Meter, Environmental consultant (specify type), Eyeglasses, Cane (specify quad or straight), Ostomy supplies(single point cane, 4-wheeled walker, colostomy) ADL Screening (condition at time of admission) Patient's cognitive ability adequate to safely complete daily activities?: Yes Is the patient deaf or have difficulty hearing?: No Does the patient have difficulty seeing, even when wearing glasses/contacts?: No Does the patient have difficulty concentrating, remembering, or making decisions?: Yes(has been a little confused lately) Patient able to express need for assistance with ADLs?: Yes Does the patient have difficulty dressing or bathing?: Yes Independently performs ADLs?: No Communication: Independent Dressing (OT): Needs assistance Is this a change from baseline?: Change from baseline, expected to last >3 days Grooming: Needs assistance Is this a change from baseline?: Change  from baseline, expected to last >3 days Feeding: Needs assistance Is this a change from baseline?: Change from baseline, expected to last >3 days Bathing: Needs assistance Is this a change from baseline?: Change from baseline, expected to last >3 days Toileting: Dependent Is this a change from baseline?: Change from baseline, expected to last >3days In/Out Bed: Dependent Is this a change from baseline?: Change from baseline, expected to last >3 days Walks in Home: Dependent Is this a change from baseline?: Change from baseline, expected to last >3 days Does the patient have difficulty walking or climbing stairs?: Yes Weakness of Legs: Both Weakness of Arms/Hands: Both  Permission Sought/Granted                  Emotional Assessment     Affect (typically observed): Accepting Orientation: : Oriented to Self, Oriented to Place, Oriented to  Time, Oriented to Situation      Admission diagnosis:  Dehydration [E86.0] Hyponatremia [E87.1] Patient Active Problem List   Diagnosis Date Noted  . Pressure injury of skin 01/07/2019  . Zinc deficiency   . Malnutrition of moderate degree 01/04/2019  . Radiation gastroenteritis   . Radiation colitis   . Hyponatremia 01/01/2019  . Pancytopenia ([place]) 01/01/2019  . Hematochezia   . Abnormal CT of the abdomen   . Severe protein-calorie malnutrition ([place]) 08/19/2018  . 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[place]) 08/19/2018  . Diverticulitis of colon   . Low magnesium level 07/30/2018  .  Hypokalemia 07/21/2018  . Bowel obstruction in setting of lymphoma and sigmoid stricture 07/21/2018  . Diarrhea 07/21/2018  . Burkitt lymphoma, lymph nodes of multiple sites ([place]) 05/22/2018  . Diabetes mellitus ([place])   . Edema   . Deep vein thrombosis (DVT) of femoral vein of right lower extremity ([place])   . Counseling regarding advance care planning and goals of care 04/25/2018  . Gastrointestinal hemorrhage   . Encounter for antineoplastic chemotherapy   .  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[place]) 04/10/2018  . Port-A-Cath in place 03/27/2018  . Acute deep vein thrombosis (DVT) of femoral vein of right lower extremity ([place])   . 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[place]) 03/15/2018  . Bilateral pulmonary embolism ([place]) 03/10/2018  . Anemia 03/10/2018  . Hypoglycemia 03/10/2018  . Occult blood in stools 03/10/2018  . Abdominal mass 03/10/2018  . Tachycardia 01/31/2017  . Controlled diabetes mellitus type II without complication ([place]) 76/81/1572  . Hypertension 06/05/2014  . Irritable bowel syndrome 03/30/2010  . Morbid obesity ([place]) 12/29/2009  . Dysmetabolic syndrome X 62/11/5595  . BACK PAIN WITH RADICULOPATHY 06/09/2007  . Hyperlipidemia 05/12/2007  . ALLERGIC RHINITIS 05/12/2007   PCP:  [name], MD Pharmacy:   [id number] [place] [place] [address] [id number]: [phone]: [phone] [facility] [place] Registered [employer] Sites [id number]: [phone]: [phone]    Social Determinants of Health (SDOH) Interventions    Readmission Risk Interventions No flowsheet data found.

## 2019-01-09 ENCOUNTER — Ambulatory Visit
Admission: RE | Admit: 2019-01-09 | Discharge: 2019-01-09 | Disposition: A | Discharge status: Home / Self Care | Payer: BLUE CROSS/BLUE SHIELD | Source: Ambulatory Visit | Attending: Radiation Oncology | Admitting: Radiation Oncology

## 2019-01-09 DIAGNOSIS — R198 Other specified symptoms and signs involving the digestive system and abdomen: Secondary | ICD-10-CM

## 2019-01-09 DIAGNOSIS — D709 Neutropenia, unspecified: Secondary | ICD-10-CM

## 2019-01-09 DIAGNOSIS — K36 Other appendicitis: Secondary | ICD-10-CM

## 2019-01-09 DIAGNOSIS — C8378 Burkitt lymphoma, lymph nodes of multiple sites: Secondary | ICD-10-CM

## 2019-01-09 DIAGNOSIS — D708 Other neutropenia: Secondary | ICD-10-CM

## 2019-01-09 DIAGNOSIS — Z932 Ileostomy status: Secondary | ICD-10-CM

## 2019-01-09 LAB — CBC WITH DIFFERENTIAL/PLATELET
Abs Immature Granulocytes: 0.01 10*3/uL (ref 0.00–0.07)
Basophils Absolute: 0 10*3/uL (ref 0.0–0.1)
Basophils Relative: 1 %
Eosinophils Absolute: 0 10*3/uL (ref 0.0–0.5)
Eosinophils Relative: 1 %
HCT: 21.2 % — ABNORMAL LOW (ref 39.0–52.0)
Hemoglobin: 6.9 g/dL — CL (ref 13.0–17.0)
Immature Granulocytes: 1 %
Lymphocytes Relative: 11 %
Lymphs Abs: 0.1 10*3/uL — ABNORMAL LOW (ref 0.7–4.0)
MCH: 30.5 pg (ref 26.0–34.0)
MCHC: 32.5 g/dL (ref 30.0–36.0)
MCV: 93.8 fL (ref 80.0–100.0)
Monocytes Absolute: 0.2 10*3/uL (ref 0.1–1.0)
Monocytes Relative: 13 %
Neutro Abs: 0.9 10*3/uL — ABNORMAL LOW (ref 1.7–7.7)
Neutrophils Relative %: 73 %
Platelets: 34 10*3/uL — ABNORMAL LOW (ref 150–400)
RBC: 2.26 MIL/uL — ABNORMAL LOW (ref 4.22–5.81)
RDW: 19.1 % — ABNORMAL HIGH (ref 11.5–15.5)
WBC: 1.2 10*3/uL — CL (ref 4.0–10.5)
nRBC: 0 % (ref 0.0–0.2)

## 2019-01-09 LAB — CULTURE, BLOOD (ROUTINE X 2)
Culture: NO GROWTH
Culture: NO GROWTH
Special Requests: ADEQUATE
Special Requests: ADEQUATE

## 2019-01-09 LAB — BASIC METABOLIC PANEL
Anion gap: 6 (ref 5–15)
BUN: 8 mg/dL (ref 8–23)
CO2: 22 mmol/L (ref 22–32)
Calcium: 7.2 mg/dL — ABNORMAL LOW (ref 8.9–10.3)
Chloride: 103 mmol/L (ref 98–111)
Creatinine, Ser: 0.37 mg/dL — ABNORMAL LOW (ref 0.61–1.24)
GFR calc Af Amer: >60 mL/min (ref >60)
GFR calc non Af Amer: >60 mL/min (ref >60)
Glucose, Bld: 108 mg/dL — ABNORMAL HIGH (ref 70–99)
Potassium: 3.2 mmol/L — ABNORMAL LOW (ref 3.5–5.1)
Sodium: 131 mmol/L — ABNORMAL LOW (ref 135–145)

## 2019-01-09 LAB — PREALBUMIN: Prealbumin: <5 mg/dL — ABNORMAL LOW (ref 18–38)

## 2019-01-09 LAB — HEPATIC FUNCTION PANEL
ALT: 40 U/L (ref 0–44)
AST: 17 U/L (ref 15–41)
Albumin: 1.6 g/dL — ABNORMAL LOW (ref 3.5–5.0)
Alkaline Phosphatase: 142 U/L — ABNORMAL HIGH (ref 38–126)
Bilirubin, Direct: 0.5 mg/dL — ABNORMAL HIGH (ref 0.0–0.2)
Indirect Bilirubin: 0.6 mg/dL (ref 0.3–0.9)
Total Bilirubin: 1.1 mg/dL (ref 0.3–1.2)
Total Protein: 4.6 g/dL — ABNORMAL LOW (ref 6.5–8.1)

## 2019-01-09 LAB — GLUCOSE, CAPILLARY
Glucose-Capillary: 110 mg/dL — ABNORMAL HIGH (ref 70–99)
Glucose-Capillary: 185 mg/dL — ABNORMAL HIGH (ref 70–99)
Glucose-Capillary: 110 mg/dL — ABNORMAL HIGH (ref 70–99)
Glucose-Capillary: 151 mg/dL — ABNORMAL HIGH (ref 70–99)

## 2019-01-09 LAB — C-REACTIVE PROTEIN: CRP: 14 mg/dL — ABNORMAL HIGH (ref <1.0)

## 2019-01-09 LAB — SEDIMENTATION RATE: Sed Rate: 122 mm/hr — ABNORMAL HIGH (ref 0–16)

## 2019-01-09 LAB — PREPARE RBC (CROSSMATCH)

## 2019-01-09 LAB — PROCALCITONIN: Procalcitonin: 0.23 ng/mL

## 2019-01-09 LAB — MAGNESIUM: Magnesium: 1.4 mg/dL — ABNORMAL LOW (ref 1.7–2.4)

## 2019-01-09 MED ORDER — SODIUM CHLORIDE 0.9% IV SOLUTION
Freq: Once | INTRAVENOUS | Status: AC
  Administered 2019-01-09: 11:00:00 via INTRAVENOUS

## 2019-01-09 MED ORDER — MAGNESIUM SULFATE 2 GM/50ML IV SOLN
2.0000 g | Freq: Once | INTRAVENOUS | Status: AC
  Administered 2019-01-09: 2 g via INTRAVENOUS
  Filled 2019-01-09: qty 50

## 2019-01-09 MED ORDER — PRO-STAT SUGAR FREE PO LIQD
30.0000 mL | Freq: Two times a day (BID) | ORAL | Status: DC
  Administered 2019-01-12 – 2019-01-14 (×2): 30 mL via ORAL
  Filled 2019-01-09 (×6): qty 30

## 2019-01-09 MED ORDER — POTASSIUM CHLORIDE CRYS ER 20 MEQ PO TBCR
40.0000 meq | EXTENDED_RELEASE_TABLET | ORAL | Status: AC
  Administered 2019-01-09 (×2): 40 meq via ORAL
  Filled 2019-01-09 (×3): qty 2

## 2019-01-09 MED ORDER — ENSURE ENLIVE PO LIQD
237.0000 mL | Freq: Three times a day (TID) | ORAL | Status: DC
  Administered 2019-01-10 – 2019-01-14 (×6): 237 mL via ORAL

## 2019-01-09 NOTE — Plan of Care (Signed)

## 2019-01-09 NOTE — Progress Notes (Signed)
HEMATOLOGY/ONCOLOGY INPATIENT PROGRESS NOTE  Date of Service: 01/09/2019  Inpatient Attending: .[name], MD   SUBJECTIVE:   Mark Frey was seen in follow-up.  I visited him and had his wife Mark Frey on the phone.  He appears to feel a little down.  Borderline p.o. intake.  Noted specific food preferences and his wife is going to try to get him some fruit cups and other things he likes to eat.  Still with significantly liquid ileostomy output.  Patient notes some decrease in his rectal stool output and with less bleeding.  Still with persistent cytopenias and needing PRBC transfusions.  No overt coagulopathy or evidence of DIC on labs today.  Did send a procalcitonin level to rule out any other overt bacterial infectious process.  Procalcitonin not significantly elevated. Notes left-sided abdominal pain slightly improved and is less tender on palpation though he still is having some tenderness especially in the left lower quadrant.  No guarding or rigidity. Did discuss with the patient and his wife and increase the dose of Granix given the neutropenia has not yet resolved. Appreciate hospital medicine cares. Provided supportive counseling given he is feeling a little down.  OBJECTIVE:  Resting comfortably in bed  PHYSICAL EXAMINATION: . Vitals:   01/08/19 1235 01/08/19 1250 01/08/19 2228 01/09/19 0533  BP: 121/70 121/70 115/72 118/66  Pulse: (!) 106 (!) 105 (!) 110 94  Resp: 18 18 17 16   Temp: 99.3 F (37.4 C) 99.7 F (37.6 C) 99.4 F (37.4 C) 97.6 F (36.4 C)  TempSrc: Oral Oral Oral Oral  SpO2: 98% 96% 94% 95%  Weight:      Height:       Filed Weights   01/01/19 1052 01/02/19 2050  Weight: 200 lb (90.7 kg) 190 lb 4.1 oz (86.3 kg)   .Body mass index is 28.1 kg/m.  GENERAL: Resting, somewhat pale appearing, in no acute distress. OROPHARYNX: Oral mucosa moist LUNGS: clear to auscultation HEART: regular rate & rhythm ABDOMEN: Tenderness to deep palpation over  the left part of the abdomen especially left lower quadrant Musculoskeletal: 1+ pedal edema PSYCH: alert & oriented x 3 .  Appears somewhat down emotionally. NEURO: no focal motor/sensory deficits  MEDICAL HISTORY:  Past Medical History:  Diagnosis Date   ALLERGIC RHINITIS    Cancer ([place])    Lymphoma    Diabetes mellitus    Hyperlipidemia     SURGICAL HISTORY: Past Surgical History:  Procedure Laterality Date   BIOPSY  03/15/2018   Procedure: BIOPSY;  Surgeon: Milus Banister, MD;  Location: [facility];  Service: Endoscopy;;   BOWEL RESECTION N/A 08/25/2018   Procedure: LAPROSCOPIC LOOP ILEOSTOMY;  Surgeon: Greer Pickerel, MD;  Location: [name];  Service: General;  Laterality: N/A;   CLEFT PALATE REPAIR     COLONOSCOPY N/A 03/15/2018   Procedure: COLONOSCOPY;  Surgeon: Milus Banister, MD;  Location: [name];  Service: Endoscopy;  Laterality: N/A;   COLONOSCOPY N/A 08/20/2018   Procedure: COLONOSCOPY;  Surgeon: Irene Shipper, MD;  Location: [facility];  Service: Endoscopy;  Laterality: N/A;   deviated septum repair     slight improvement   ESOPHAGOGASTRODUODENOSCOPY N/A 03/15/2018   Procedure: ESOPHAGOGASTRODUODENOSCOPY (EGD);  Surgeon: Milus Banister, MD;  Location: [name];  Service: Endoscopy;  Laterality: N/A;   IR IMAGING GUIDED PORT INSERTION  03/17/2018   LAPAROSCOPY N/A 08/25/2018   Procedure: LAPAROSCOPY DIAGNOSTIC;  Surgeon: Greer Pickerel, MD;  Location: [facility];  Service: General;  Laterality:  N/A;   TONSILLECTOMY      SOCIAL HISTORY: Social History   Socioeconomic History   Marital status: Married    Spouse name: lisa   Number of children: 0   Years of education: Not on file   Highest education level: Not on file  Occupational History   Not on file  Social Needs   Financial resource strain: Not hard at all   Food insecurity:    Worry: Never true    Inability: Never true   Transportation needs:    Medical: No    Non-medical:  No  Tobacco Use   Smoking status: Never Smoker   Smokeless tobacco: Never Used  Substance and Sexual Activity   Alcohol use: Yes    Comment: occasional   Drug use: No   Sexual activity: Yes  Lifestyle   Physical activity:    Days per week: 0 days    Minutes per session: 0 min   Stress: Not at all  Relationships   Social connections:    Talks on phone: More than three times a week    Gets together: More than three times a week    Attends religious service: 1 to 4 times per year    Active member of club or organization: No    Attends meetings of clubs or organizations: Never    Relationship status: Married   Intimate partner violence:    Fear of current or ex partner: No    Emotionally abused: No    Physically abused: No    Forced sexual activity: No  Other Topics Concern   Not on file  Social History Narrative   Married 1985. No kids. 4 small dogs.       Works in Financial trader, residential      Hobbies: work on cars, Haematologist, exercise as able    FAMILY HISTORY: Family History  Problem Relation Age of Onset   Lung cancer Mother        smoker   Brain cancer Mother        metastasis   AAA (abdominal aortic aneurysm) Father        smoker    ALLERGIES:  is allergic to ciprofloxacin.  MEDICATIONS:  Scheduled Meds:  sodium chloride   Intravenous Once   B-complex with vitamin C  1 tablet Oral Daily   cholestyramine light  4 g Oral 3 times per day   feeding supplement (ENSURE ENLIVE)  237 mL Oral BID BM   insulin aspart  0-5 Units Subcutaneous QHS   insulin aspart  0-9 Units Subcutaneous TID WC   Tbo-filgastrim (GRANIX) SQ  480 mcg Subcutaneous q1800   zinc sulfate  220 mg Oral BID   Continuous Infusions:  sodium chloride 75 mL/hr at 01/09/19 0439   ceFEPime (MAXIPIME) IV 2 g (01/09/19 0552)   magnesium sulfate 1 - 4 g bolus IVPB 2 g (01/09/19 0913)   metronidazole 500 mg (01/09/19 0444)   PRN Meds:.acetaminophen **OR**  acetaminophen, ondansetron **OR** ondansetron (ZOFRAN) IV, sodium chloride flush  REVIEW OF SYSTEMS:    10 Point review of Systems was done is negative except as noted above.   LABORATORY DATA:  I have reviewed the data as listed  . CBC Latest Ref Rng & Units 01/09/2019 01/08/2019 01/08/2019  WBC 4.0 - 10.5 K/uL 1.2(LL) - 1.3(LL)  Hemoglobin 13.0 - 17.0 g/dL 6.9(LL) - 6.7(LL)  Hematocrit 39.0 - 52.0 % 21.2(L) - 20.7(L)  Platelets 150 - 400 K/uL 34(L) 37(L) 37(L)    .  CMP Latest Ref Rng & Units 01/09/2019 01/08/2019 01/07/2019  Glucose 70 - 99 mg/dL 108(H) 120(H) 110(H)  BUN 8 - 23 mg/dL 8 8 9   Creatinine 0.61 - 1.24 mg/dL 0.37(L) 0.49(L) 0.51(L)  Sodium 135 - 145 mmol/L 131(L) 131(L) 131(L)  Potassium 3.5 - 5.1 mmol/L 3.2(L) 3.3(L) 3.2(L)  Chloride 98 - 111 mmol/L 103 103 103  CO2 22 - 32 mmol/L 22 20(L) 21(L)  Calcium 8.9 - 10.3 mg/dL 7.2(L) 7.1(L) 7.3(L)  Total Protein 6.5 - 8.1 g/dL - - -  Total Bilirubin 0.3 - 1.2 mg/dL - - -  Alkaline Phos 38 - 126 U/L - - -  AST 15 - 41 U/L - - -  ALT 0 - 44 U/L - - -   Component     Latest Ref Rng & Units 01/08/2019 01/08/2019         2:12 AM  1:26 PM  WBC     4.0 - 10.5 K/uL 1.3 (LL)   RBC     4.22 - 5.81 MIL/uL 2.16 (L)   Hemoglobin     13.0 - 17.0 g/dL 6.7 (LL)   HCT     39.0 - 52.0 % 20.7 (L)   MCV     80.0 - 100.0 fL 95.8   MCH     26.0 - 34.0 pg 31.0   MCHC     30.0 - 36.0 g/dL 32.4   RDW     11.5 - 15.5 % 19.0 (H)   Platelets     150 - 400 K/uL 37 (L) 37 (L)  nRBC     0.0 - 0.2 % 0.0   Neutrophils     % 75   NEUT#     1.7 - 7.7 K/uL 1.0 (L)   Lymphocytes     % 8   Lymphocyte #     0.7 - 4.0 K/uL 0.1 (L)   Monocytes Relative     % 13   Monocyte #     0.1 - 1.0 K/uL 0.2   Eosinophil     % 1   Eosinophils Absolute     0.0 - 0.5 K/uL 0.0   Basophil     % 1   Basophils Absolute     0.0 - 0.1 K/uL 0.0   WBC Morphology      DOHLE BODIES   Immature Granulocytes     % 2   Abs Immature Granulocytes      0.00 - 0.07 K/uL 0.02   Ovalocytes      PRESENT   Sodium     135 - 145 mmol/L 131 (L)   Potassium     3.5 - 5.1 mmol/L 3.3 (L)   Chloride     98 - 111 mmol/L 103   CO2     22 - 32 mmol/L 20 (L)   Glucose     70 - 99 mg/dL 120 (H)   BUN     8 - 23 mg/dL 8   Creatinine     0.61 - 1.24 mg/dL 0.49 (L)   Calcium     8.9 - 10.3 mg/dL 7.1 (L)   GFR, Est Non African American     >60 mL/min >60   GFR, Est African American     >60 mL/min >60   Anion gap     5 - 15 8   Prothrombin Time     11.4 - 15.2 seconds  15.1  INR  0.8 - 1.2  1.2  APTT     24 - 36 seconds  26  Fibrinogen     210 - 475 mg/dL  >800 (H)  D-Dimer, Quant     0.00 - 0.50 ug/mL-FEU  2.48 (H)  Smear Review       NO SCHISTOCYTES SEEN  Immature Platelet Fraction     1.2 - 8.6 %    Magnesium     1.7 - 2.4 mg/dL 1.7   Procalcitonin     ng/mL  0.20    RADIOGRAPHIC STUDIES: I have personally reviewed the radiological images as listed and agreed with the findings in the report. Dg Chest 2 View  Result Date: 01/01/2019 CLINICAL DATA:  Short of breath.  Recent chemotherapy for lymphoma EXAM: CHEST - 2 VIEW COMPARISON:  08/24/2018 FINDINGS: Heart size and vascularity normal. Mild linear densities in the right lung base are new and may be atelectasis. No pleural effusion. Port-A-Cath tip SVC. IMPRESSION: Mild right lower lobe atelectasis.  Negative for pneumonia. Electronically Signed   By: Franchot Gallo M.D.   On: 01/01/2019 19:38   Ct Abdomen Pelvis W Contrast  Result Date: 01/01/2019 CLINICAL DATA:  Diarrhea and weakness. History of Burkitt's lymphoma. Recently completed radiation. Ileostomy EXAM: CT ABDOMEN AND PELVIS WITH CONTRAST TECHNIQUE: Multidetector CT imaging of the abdomen and pelvis was performed using the standard protocol following bolus administration of intravenous contrast. CONTRAST:  1103m OMNIPAQUE IOHEXOL 300 MG/ML  SOLN COMPARISON:  CT abdomen pelvis 10/05/2018 FINDINGS: Lower chest: Mild  scarring in the lung bases. Negative for infiltrate or effusion. Hepatobiliary: Small subcentimeter hypodensities in the liver are unchanged and likely benign cysts. Hypodensity adjacent to the interhemispheric fissure unchanged compatible with fatty infiltration of the liver. Gallbladder contracted and mildly thickened. No biliary dilatation. Pancreas: Negative Spleen: Negative Adrenals/Urinary Tract: 5 mm right lower pole calculus and 2 mm right midpole calculus. No renal obstruction or mass. Negative bladder. Stomach/Bowel: Ileostomy. Marked thickening of the entire colon from cecum to rectum with extensive stranding in the pericolonic fat. No free air or free fluid. Small bowel nondilated and non thickened. Vascular/Lymphatic: Minimal atherosclerotic disease in the aorta. Negative for lymphadenopathy. Reproductive: Moderate prostate enlargement with numerous calcifications. Other: Negative for abscess Musculoskeletal: Lumbar degenerative changes without acute skeletal abnormality. IMPRESSION: Findings compatible with severe colitis. Diffuse thickening of the colon with surrounding edema in the pericolonic fat. No abscess or free fluid. No small bowel obstruction. Ileostomy noted in the right lower quadrant. Negative for mass or adenopathy. Electronically Signed   By: CFranchot GalloM.D.   On: 01/01/2019 19:37    ASSESSMENT & PLAN:  63y.o. male with  1. 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 No BM or CNS involvement.   04/18/18 PET/CT revealed Continued improvement in right colonic wall thickening and surrounding bulky masses consistent with treated lymphoma. No hypermetabolic activity to suggest residual tumor. There is some hypermetabolic activity associated with the sigmoid colon which demonstrates mild wall thickening, surrounding inflammation and underlying diverticulosis, suggesting mild diverticulitis. Suspected treatment  related changes throughout the bone marrow. There is mildly increased activity within the clivus without clear corresponding finding on the CT images. Attention on follow-up recommended. Nonobstructing right renal calculi. 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   06/09/18 PET/CT revealedSoft tissue lesion within the ventral pelvis is again identified demonstrating mild to moderate increased uptake within SUV max of 5.61. Deauville criteria 4. Concerning for residual metabolically active tumor. 2. Similar appearance of diffusely thickened appendix within the right lower quadrant of the abdomen. Findings may reflect treatment related changes. 3. Increased radiotracer uptake throughout the bone marrow which is favored to represent treatment related changes. 4. Lad coronary artery atherosclerotic calcifications.   S/p 6 cycles of EPOCH-R completed on 07/03/18  10/05/18 CT A/P revealed Persistent wall thickening in the sigmoid colon although a component of this could be due to diverticulosis. There is also a suggestion of wall thickening and potential mucosal enhancement in the terminal ileum, as well as adjacent indistinct stranding in the mesenteric and omental adipose tissues roughly similar to the prior exam. Some of this may be treatment related. Inflammatory process such as Crohn's disease not excluded. 2. No current adenopathy. 3. Other imaging findings of potential clinical significance: Nonobstructive right nephrolithiasis. Aortic Atherosclerosis.  Moderate prostatomegaly. Left foraminal impingement at L4-5.  S/p IMRT of 36Gy over 20 fractions, completed 12/06/18.  2.H/oRLE DVT and b/l PE.  Anticoagulation held at this time in the setting of thrombocytopenia and GI bleeding from severe radiation colitis. -May consider prophylactic Lovenox while hospitalized if platelets above 50k and no overt GI bleeding with stable hemoglobin. -SCD for DVT prophylaxis.  3. H/o Small bowel obstruction/ileus with sigmoid thickening/stenosis . CT scan - sigmoid obstructive lesion ? Scar tissue from resolved lymphoma vs residual lymphoma vs post inflammatory scarring from diverticulitis/limited perforation. Also has SBO from adhesions. Colonoscopy 08/21/2018 - no intramucosal lesions. Extrinsic compression of sigmoid colon. 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 Greer Pickerel  4.  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  GI panel and C. difficile negative.  CMV IgM negative, EBV IgM negative. 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  5.  Dehydration with hypovolemic hyponatremia.  Other electrolyte issues including hypokalemia and hypomagnesemia. 6.  Pancytopenia Anemia likely multifactorial but could be from GI bleeding plus significant inflammation from severe radiation colitis. Leukopenia/lymphopenia likely from radiation toxicity.  Protein-losing enteropathy can also accentuate the lymphopenia and bone marrow suppression due to severe inflammatory state from his radiation colitis. Thrombocytopenia from consumption related to GI bleeding and from radiation toxicity. No overt evidence of DIC. Significantly elevated fibrinogen level as a marker of severe inflammation and acute phase reactant. B12 within normal limits Copper levels within normal limits at 132  zinc deficiency noted- 44 Viral work-up ordered and currently pending Low likelihood of Burkitt's lymphoma  progression at this time. Plan  -Patient still with marginal p.o. intake. -Will need close nutritional follow-up for calorie counting and determining patient's nutritional intake food choices and p.o. nutritional optimization. -If nutritional optimization is not proceeding orally will strongly need to consider TPN in the setting of multiple nutritional deficiencies trace element deficiencies and affected bowel absorptive function and for consideration of bowel rest. -Hope is neutropenia resolved so TPN can be considered more safely. -Towards this we have increased his dose of Granix to 480 mcg daily after discussing with this with the patient and his wife. -IV fluids and electrolyte replacement as per hospital medicine team.  Much appreciate their help -GI following for management of radiation pancolitis.  Hope resolution of neutropenia soon can expand options for enema-based treatments. -track sed rate and CRP as a function of resoliving bowel inflammation. -on empiric broad-spectrum antibiotics per hospital medicine in the setting of neutropenic fever. - oral replacement for zinc deficiency. -Empiric B complex. -Transfuse PRBC PRN for hemoglobin of less than 7.5 or if actively bleeding or if symptomatic. -Transfuse platelets as needed for platelet count of less than 20,000 in the setting of GI ulceration or if actively bleeding and less than 50,000. -We will continue to follow -Updated his wife Mark Frey on the phone regarding his current condition.  . The total time spent in the appointment was 35 minutes and more than 50% was on counseling and direct patient cares.     Sullivan Lone MD [place] AAHIVMS [facility] Hematology/Oncology Physician [facility]  (Office):       [phone] (Work cell):  [phone] (Fax):           [phone]

## 2019-01-09 NOTE — Progress Notes (Addendum)
Progress Note    ASSESSMENT AND PLAN:   63. 63 yo male with Burkitt's lymphoma admitted with increased ostomy output and bloody mucoid stool from rectum.  -CT scan c/w acute diffuse colitis,  maybe radiation vrs diversion colitis. Infectious workup including c-diff, gi path panel, CMV, EBV all negative. Had improvement with short chain fatty acid enemas but stopped due to neutropenia.  -No further rectal bleeding. If bleeds again then may benefit from platelets.  He had ~ 1900 ml ostomy output yesterday. For high ostomy output wel added cholestyramine 4 grams TID yesterday, will reassess ostomy output in a couple of days.    2.  Fever, Tmax 99.7  3. Pancytopenia, received another unit of blood yesterday without meaningful increase in hgb 6.7 >>> 6.9. WBC 1.2 (neut 0.9), platelets 34.  Oncology saw him yesterday, considering Granix.   4. Hx of DVT /PE. Eliquis on hold  SUBJECTIVE   Doesn't offer much. Says he isn't having any further bleeding from rectum. He is unsure about amount of ostomy output   OBJECTIVE:     Vital signs in last 24 hours: Temp:  [97.6 F (36.4 C)-99.7 F (37.6 C)] 97.6 F (36.4 C) (04/28 0533) Pulse Rate:  [94-110] 94 (04/28 0533) Resp:  [16-18] 16 (04/28 0533) BP: (115-121)/(66-72) 118/66 (04/28 0533) SpO2:  [94 %-98 %] 95 % (04/28 0533) Last BM Date: 01/08/19 General:   Alert, male in NAD EENT:  Normal hearing, non icteric sclera, conjunctive pink.  Heart:  Regular rate and rhythm; no murmur.  No lower extremity edema   Pulm: Normal respiratory effort, lungs CTA bilaterally without wheezes or crackles. Abdomen:  Soft, nondistended, mimimal LLQ tenderness. Ostomy bag with loose but not watery stool. A few bowel sounds,. Neurologic:  Alert and  oriented x4;  grossly normal neurologically. Psych:  Pleasant, cooperative.  Normal mood and affect.   Intake/Output from previous day: 04/27 0701 - 04/28 0700 In: 4125.7 [P.O.:120; I.V.:2233.2;  Blood:487.5; IV Piggyback:1285] Out: 7517 [Urine:1330; GYFVC:9449] Intake/Output this shift: Total I/O In: -  Out: 475 [Urine:475]  Lab Results: Recent Labs    01/07/19 0535 01/08/19 0212 01/08/19 1326 01/09/19 0353  WBC 1.5* 1.3*  --  1.2*  HGB 7.4* 6.7*  --  6.9*  HCT 22.9* 20.7*  --  21.2*  PLT 41* 37* 37* 34*   BMET Recent Labs    01/07/19 0535 01/08/19 0212 01/09/19 0353  NA 131* 131* 131*  K 3.2* 3.3* 3.2*  CL 103 103 103  CO2 21* 20* 22  GLUCOSE 110* 120* 108*  BUN 9 8 8   CREATININE 0.51* 0.49* 0.37*  CALCIUM 7.3* 7.1* 7.2*   LFT No results for input(s): PROT, ALBUMIN, AST, ALT, ALKPHOS, BILITOT, BILIDIR, IBILI in the last 72 hours. PT/INR Recent Labs    01/08/19 1326  LABPROT 15.1  INR 1.2   Hepatitis Panel No results for input(s): HEPBSAG, HCVAB, HEPAIGM, HEPBIGM in the last 72 hours.  No results found.   Principal Problem:   Hyponatremia Active Problems:   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HCC)   Hypokalemia   Diarrhea   Severe protein-calorie malnutrition (HCC)   Pancytopenia (HCC)   Hematochezia   Radiation gastroenteritis   Radiation colitis   Malnutrition of moderate degree   Zinc deficiency   Pressure injury of skin   Other neutropenia ([place])     LOS: 8 days   Mark Frey ,NP 01/09/2019, 9:39 AM    Attending physician's note   I  have taken a history, examined the patient and reviewed the chart. I agree with the Advanced Practitioner's note, impression and recommendations.  Remains pancytopenic with neutropenia  Did not respond to packed RBC transfusion, hemoglobin 6.9 from 6.7.   No rectal bleeding  Ileostomy output remains high, added cholestyramine yesterday will reassess in few days  Likely etiology neutropenic enterocolitis versus radiation/chemo-induced colitis versus diversion colitis  Continue to hold short chain fatty acid rectal enema and probiotics given neutropenia  Continue broad spectrum antibiotics  No  plan for ileoscopy or colonoscopy.    Damaris Hippo , MD [phone]

## 2019-01-09 NOTE — Progress Notes (Signed)
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  GI, oncology following.  Hospital course remarkable for persistent pancytopenia.  Assessment & Plan:   Principal Problem:   Hyponatremia Active Problems:   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HCC)   Hypokalemia   Diarrhea   Severe protein-calorie malnutrition (HCC)   Pancytopenia (HCC)   Hematochezia   Radiation gastroenteritis   Radiation colitis   Malnutrition of moderate degree   Zinc deficiency   Pressure injury of skin   Other neutropenia (HCC)  Radiation colitis:  CT scan showed marked thickening of the entire colon from cecum to rectum.  Highly suggestive of radiation colitis.  GI following.  Started therapy for  radiation colitis with short chain fatty acids enema but has been stopped now due to concern for neutropenic fever. He denies any abdominal pain today.  Tolerating diet.  Passing very  loose clear stool in the ostomy bag.  Also passing some stool from rectum.GI started on cholestyramine. GI pathogen panel, C. difficile negative.  There was also concern for viral colitis.  CMV , EBV PCR negative , parvovirus Ab negative.  Fever: Afebrile this morning.Blood cultures NGTD,day 5. Started  on broad-spectrum antibiotics with vancomycin and cefepime.  Initiated neutropenic precaution.DCed vanco .Added flagyl yesterday for anaerobic coverage.Cefepime day 6.Please consider stopping cefepime tomorrow.  Dehydration: Continue gentle IV fluids.  Hyponatremia: Improving.   Continue gentle IV fluids.  Hypokalemia/Hypomagnesemia: Supplemented with Mg/potassium.  Pancytopenia: ANC less than 1000.  On granix with increased dose. Oncology following.  Also has severe anemia and thrombocytopenia.  Hb dropped to 6.9 this morning.Being again transfused with 1 unit today. S/P 4 units total  of PRBCs during this hospitalization[place] [address].  Will transfuse with platelets if its less than 20,000.  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  Eliquis stopped due to thrombocytopenia .Will resume when appropriate.  Generalized weakness/deconditioning: Home health recommended after physical therapy evaluation.   Nutrition Problem: Moderate Malnutrition Etiology: chronic illness, catabolic illness, cancer and cancer related treatments Consulted dietitian.     DVT prophylaxis:SCD Code Status: Full Family Communication: None present at the bedside.  I asked the patient if I can call his family for update but he said no need to call anybody. Disposition Plan: Home after stabilization of neutropenia/radiation colitis   Consultants: GI, oncology  Procedures: None  Antimicrobials:  Anti-infectives (From admission, onward)   Start     Dose/Rate Route Frequency Ordered Stop   01/08/19 1200  metroNIDAZOLE (FLAGYL) IVPB 500 mg     500 mg 100 mL/hr over 60 Minutes Intravenous Every 8 hours 01/08/19 1119     01/04/19 2200  vancomycin (VANCOCIN) 1,250 mg in sodium chloride 0.9 % 250 mL IVPB  Status:  Discontinued     1,250 mg 166.7 mL/hr over 90 Minutes Intravenous Every 12 hours 01/04/19 0819 01/07/19 0753   01/04/19 0900  vancomycin (VANCOCIN) 1,750 mg in sodium chloride 0.9 % 500 mL IVPB     1,750 mg 250 mL/hr over 120 Minutes Intravenous  Once 01/04/19 0819  01/04/19 1131   01/04/19 0830  ceFEPIme (MAXIPIME) 2 g in sodium chloride 0.9 % 100 mL IVPB     2 g 200 mL/hr over 30 Minutes Intravenous Every 8 hours 01/04/19 0815        Subjective: Patient seen and  examined the bedside this morning.  Remains comfortable.  Hemodynamically stable.  Denies any abdominal pain, nausea or vomiting.  Tolerating diet but having poor oral intake. Objective: Vitals:   01/08/19 2228 01/09/19 0533 01/09/19 1030 01/09/19 1058  BP: 115/72 118/66 120/72 118/68  Pulse: (!) 110 94 98 93  Resp: 17 16 16 15   Temp: 99.4 F (37.4 C) 97.6 F (36.4 C) 97.6 F (36.4 C) 98.4 F (36.9 C)  TempSrc: Oral Oral Oral Oral  SpO2: 94% 95%  97%  Weight:      Height:        Intake/Output Summary (Last 24 hours) at 01/09/2019 1326 Last data filed at 01/09/2019 1250 Gross per 24 hour  Intake 2705.6 ml  Output 3505 ml  Net -799.4 ml   Filed Weights   01/01/19 1052 01/02/19 2050  Weight: 90.7 kg 86.3 kg    Examination:  General exam: Appears calm and comfortable ,Not in distress,average built HEENT:PERRL,Oral mucosa moist, Ear/Nose normal on gross exam Respiratory system: Bilateral equal air entry, normal vesicular breath sounds, no wheezes or crackles  Cardiovascular system: S1 & S2 heard, RRR. No JVD, murmurs, rubs, gallops or clicks.  Port-A-Cath on the right chest. Gastrointestinal system: Abdomen is nondistended, soft and nontender. No organomegaly or masses felt. Normal bowel sounds heard. Ileostomy with clear liquid stool Central nervous system: Alert and oriented. No focal neurological deficits. Extremities: No edema, no clubbing ,no cyanosis, distal peripheral pulses palpable. Skin: No rashes, lesions or ulcers,no icterus ,no pallor MSK: Normal muscle bulk,tone ,power Psychiatry: Judgement and insight appear normal. Mood & affect appropriate.     Data Reviewed: I have personally reviewed following labs and imaging studies  CBC: Recent Labs  Lab 01/05/19 0314 01/05/19 1824 01/06/19 0423 01/07/19 0535 01/08/19 0212 01/08/19 1326 01/09/19 0353  WBC 1.1*  --  1.5* 1.5* 1.3*  --  1.2*  NEUTROABS 0.8*  --  1.1* 1.1* 1.0*  --  0.9*  HGB 6.9* 7.7* 7.5* 7.4*  6.7*  --  6.9*  HCT 21.2* 23.3* 22.5* 22.9* 20.7*  --  21.2*  MCV 94.6  --  93.4 95.4 95.8  --  93.8  PLT 47*  --  42* 41* 37* 37* 34*   Basic Metabolic Panel: Recent Labs  Lab 01/05/19 0314 01/05/19 1400 01/06/19 0423 01/07/19 0535 01/07/19 0811 01/08/19 0212 01/09/19 0353  NA 131*  --  131* 131*  --  131* 131*  K 3.0*  --  3.1* 3.2*  --  3.3* 3.2*  CL 103  --  103 103  --  103 103  CO2 21*  --  21* 21*  --  20* 22  GLUCOSE 110*  --  104* 110*  --  120* 108*  BUN 8  --  8 9  --  8 8  CREATININE 0.53*  --  0.52* 0.51*  --  0.49* 0.37*  CALCIUM 7.2*  --  7.1* 7.3*  --  7.1* 7.2*  MG 1.2*  --   --   --  1.4* 1.7 1.4*  PHOS  --  2.0*  --   --   --   --   --    GFR: Estimated Creatinine Clearance: 104.1  mL/min (A) (by C-G formula based on SCr of 0.37 mg/dL (L)). Liver Function Tests: Recent Labs  Lab 01/09/19 1145  AST 17  ALT 40  ALKPHOS 142*  BILITOT 1.1  PROT 4.6*  ALBUMIN 1.6*   No results for input(s): LIPASE, AMYLASE in the last 168 hours. No results for input(s): AMMONIA in the last 168 hours. Coagulation Profile: Recent Labs  Lab 01/08/19 1326  INR 1.2   Cardiac Enzymes: No results for input(s): CKTOTAL, CKMB, CKMBINDEX, TROPONINI in the last 168 hours. BNP (last 3 results) No results for input(s): PROBNP in the last 8760 hours. HbA1C: No results for input(s): HGBA1C in the last 72 hours. CBG: Recent Labs  Lab 01/08/19 1156 01/08/19 1649 01/08/19 2225 01/09/19 0726 01/09/19 1225  GLUCAP 168* 105* 123* 110* 185*   Lipid Profile: No results for input(s): CHOL, HDL, LDLCALC, TRIG, CHOLHDL, LDLDIRECT in the last 72 hours. Thyroid Function Tests: No results for input(s): TSH, T4TOTAL, FREET4, T3FREE, THYROIDAB in the last 72 hours. Anemia Panel: No results for input(s): VITAMINB12, FOLATE, FERRITIN, TIBC, IRON, RETICCTPCT in the last 72 hours. Sepsis Labs: Recent Labs  Lab 01/08/19 1326 01/09/19 0353  PROCALCITON 0.20 0.23    Recent Results  (from the past 240 hour(s))  Gastrointestinal Panel by PCR , Stool     Status: None   Collection Time: 01/01/19  3:36 PM  Result Value Ref Range Status   Campylobacter species NOT DETECTED NOT DETECTED Final   Plesimonas shigelloides NOT DETECTED NOT DETECTED Final   Salmonella species NOT DETECTED NOT DETECTED Final   Yersinia enterocolitica NOT DETECTED NOT DETECTED Final   Vibrio species NOT DETECTED NOT DETECTED Final   Vibrio cholerae NOT DETECTED NOT DETECTED Final   Enteroaggregative E coli (EAEC) NOT DETECTED NOT DETECTED Final   Enteropathogenic E coli (EPEC) NOT DETECTED NOT DETECTED Final   Enterotoxigenic E coli (ETEC) NOT DETECTED NOT DETECTED Final   Shiga like toxin producing E coli (STEC) NOT DETECTED NOT DETECTED Final   E. coli O157 NOT DETECTED NOT DETECTED Final   Shigella/Enteroinvasive E coli (EIEC) NOT DETECTED NOT DETECTED Final   Cryptosporidium NOT DETECTED NOT DETECTED Final   Cyclospora cayetanensis NOT DETECTED NOT DETECTED Final   Entamoeba histolytica NOT DETECTED NOT DETECTED Final   Giardia lamblia NOT DETECTED NOT DETECTED Final   Adenovirus F40/41 NOT DETECTED NOT DETECTED Final   Astrovirus NOT DETECTED NOT DETECTED Final   Norovirus GI/GII NOT DETECTED NOT DETECTED Final   Rotavirus A NOT DETECTED NOT DETECTED Final   Sapovirus (I, II, IV, and V) NOT DETECTED NOT DETECTED Final    Comment: Performed at [facility], [address] [place], [place] [address]  C Difficile Quick Screen w PCR reflex     Status: None   Collection Time: 01/01/19  3:41 PM  Result Value Ref Range Status   C Diff antigen NEGATIVE NEGATIVE Final   C Diff toxin NEGATIVE NEGATIVE Final   C Diff interpretation No C. difficile detected.  Final    Comment: Performed at [facility], [place] [address] [place], [place] [address]  MRSA PCR Screening     Status: None   Collection Time: 01/01/19  3:54 PM  Result Value Ref Range Status   MRSA by PCR  NEGATIVE NEGATIVE Final    Comment:        The GeneXpert MRSA Assay (FDA approved for NASAL specimens only), is one component of a comprehensive MRSA colonization surveillance program. It  is not intended to diagnose MRSA infection nor to guide or monitor treatment for MRSA infections. Performed at [facility], [place] [address] [place], [place] [address]   Culture, blood (routine x 2)     Status: None   Collection Time: 01/04/19  9:00 AM  Result Value Ref Range Status   Specimen Description   Final    BLOOD PORTA CATH Performed at [facility] [address], [place], [place] [address]    Special Requests   Final    BOTTLES DRAWN AEROBIC AND ANAEROBIC Blood Culture adequate volume Performed at [facility] [address] [place], [place] [address]    Culture   Final    NO GROWTH 5 DAYS Performed at [facility], [place] [address], [place], [place] [address]    Report Status 01/09/2019 FINAL  Final  Culture, blood (routine x 2)     Status: None   Collection Time: 01/04/19 10:09 AM  Result Value Ref Range Status   Specimen Description   Final    BLOOD RIGHT HAND Performed at [facility] [address], [place], [place] [address]    Special Requests   Final    BOTTLES DRAWN AEROBIC AND ANAEROBIC Blood Culture adequate volume Performed at [facility] [address], [place], [place] [address]    Culture   Final    NO GROWTH 5 DAYS Performed at [facility], [place] [address] [place],  [address]    Report Status 01/09/2019 FINAL  Final         Radiology Studies: No results found.      Scheduled Meds: . sodium chloride   Intravenous Once  . B-complex with vitamin C  1 tablet Oral Daily  . cholestyramine light  4 g Oral 3 times per day  . feeding supplement (ENSURE ENLIVE)  237 mL Oral TID BM  . feeding supplement (PRO-STAT SUGAR FREE 64)  30 mL  Oral BID  . insulin aspart  0-5 Units Subcutaneous QHS  . insulin aspart  0-9 Units Subcutaneous TID WC  . Tbo-filgastrim (GRANIX) SQ  480 mcg Subcutaneous q1800  . zinc sulfate  220 mg Oral BID   Continuous Infusions: . sodium chloride 75 mL/hr at 01/09/19 0439  . ceFEPime (MAXIPIME) IV 2 g (01/09/19 0552)  . metronidazole 500 mg (01/09/19 0444)     LOS: 8 days    Time spent: 25  mins.More than 50% of that time was spent in counseling and/or coordination of care.      [name], MD [facility] Pager [phone]  If 7PM-7AM, please contact night-coverage [email or web address] Password [email or web address] 01/09/2019, 1:26 PM

## 2019-01-09 NOTE — Progress Notes (Signed)
Assumed care of patient at 1500. Agree with previous RN's assessment.

## 2019-01-09 NOTE — Progress Notes (Signed)
Nutrition Follow-up  RD working remotely.   DOCUMENTATION CODES:   Non-severe (moderate) malnutrition in context of chronic illness  INTERVENTION:  - continue ensure enlive but will increase to TID. - will order prostat BID, each supplement provides 100 kcal and 15 grams protein.  - continue to encourage PO intakes.    NUTRITION DIAGNOSIS:   Moderate Malnutrition related to chronic illness, catabolic illness, cancer and cancer related treatments as evidenced by mild fat depletion, mild muscle depletion, moderate muscle depletion. 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 He presented to the ED with complaints of diarrhea for 2-3 weeks and severe weakness. 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 GI, oncology following.  Patient has not been weighed since admission. He has been eating 50% of meals, on average. Patient enjoys ensure supplement and has been consuming it >75% of the time it is provided to him  Per notes, patient with stage 2 B-cell lymphoma, severe pancolitis thought to be 2/2 radiation toxicity, dehydration, hypovolemic hyponatremia.  Per Dr. Grier Mitts note this AM: "if nutritional optimization is not proceeding orally will strongly need to consider TPN in the setting of multiple nutritional deficiencies trace element deficiencies and affected bowel absorptive function and for consideration of bowel rest."    Medications reviewed; 1 tablet B-complex with vitamin C/day, sliding scale novolog, 2 g IV Mg sulfate x1 run 4/28, 220 mg zinc sulfate BID. Labs reviewed; CBG: 110 mg/dl today, Na: 131  mmol/l, K: 3.2 mmol/l, creatinine: 0.37 mg/dl, Ca: 7.2 mg/dl, Mg: 1.4 mg/dl.  IVF; NS @ 75 ml/hr.    Diet Order:   Diet Order            Diet Carb Modified Fluid consistency: Thin; Room service appropriate? Yes  Diet effective now              EDUCATION NEEDS:   Not appropriate for education at this time  Skin:  Skin Assessment: Skin Integrity Issues: Skin Integrity Issues:: Stage II Stage II: bilateral buttocks (new 4/23)  Last BM:  4/27 (100 ml via ileostomy at 2230)  Height:   Ht Readings from Last 1 Encounters:  01/02/19 5' 9"  (1.753 m)    Weight:   Wt Readings from Last 1 Encounters:  01/02/19 86.3 kg    Ideal Body Weight:  72.73 kg  BMI:  Body mass index is 28.1 kg/m.  Estimated Nutritional Needs:   Kcal:  2400-2600 kcal  Protein:  120-130 grams  Fluid:  >/= 2.5 L/day     Jarome Matin, MS, RD, LDN, [facility] Inpatient Clinical Dietitian Pager # [phone] After hours/weekend pager # [phone]

## 2019-01-10 ENCOUNTER — Encounter: Payer: Self-pay | Admitting: Radiation Oncology

## 2019-01-10 LAB — TYPE AND SCREEN
ABO/RH(D): O POS
Antibody Screen: NEGATIVE
Unit division: 0
Unit division: 0

## 2019-01-10 LAB — CBC WITH DIFFERENTIAL/PLATELET
Abs Immature Granulocytes: 0.03 10*3/uL (ref 0.00–0.07)
Basophils Absolute: 0 10*3/uL (ref 0.0–0.1)
Basophils Relative: 2 %
Eosinophils Absolute: 0 10*3/uL (ref 0.0–0.5)
Eosinophils Relative: 1 %
HCT: 23.3 % — ABNORMAL LOW (ref 39.0–52.0)
Hemoglobin: 7.8 g/dL — ABNORMAL LOW (ref 13.0–17.0)
Immature Granulocytes: 2 %
Lymphocytes Relative: 11 %
Lymphs Abs: 0.1 10*3/uL — ABNORMAL LOW (ref 0.7–4.0)
MCH: 31.1 pg (ref 26.0–34.0)
MCHC: 33.5 g/dL (ref 30.0–36.0)
MCV: 92.8 fL (ref 80.0–100.0)
Monocytes Absolute: 0.2 10*3/uL (ref 0.1–1.0)
Monocytes Relative: 16 %
Neutro Abs: 0.9 10*3/uL — ABNORMAL LOW (ref 1.7–7.7)
Neutrophils Relative %: 68 %
Platelets: 36 10*3/uL — ABNORMAL LOW (ref 150–400)
RBC: 2.51 MIL/uL — ABNORMAL LOW (ref 4.22–5.81)
RDW: 18.6 % — ABNORMAL HIGH (ref 11.5–15.5)
WBC: 1.3 10*3/uL — CL (ref 4.0–10.5)
nRBC: 0 % (ref 0.0–0.2)

## 2019-01-10 LAB — MAGNESIUM: Magnesium: 1.6 mg/dL — ABNORMAL LOW (ref 1.7–2.4)

## 2019-01-10 LAB — BASIC METABOLIC PANEL WITH GFR
Anion gap: 7 (ref 5–15)
BUN: 7 mg/dL — ABNORMAL LOW (ref 8–23)
CO2: 22 mmol/L (ref 22–32)
Calcium: 7.4 mg/dL — ABNORMAL LOW (ref 8.9–10.3)
Chloride: 103 mmol/L (ref 98–111)
Creatinine, Ser: 0.49 mg/dL — ABNORMAL LOW (ref 0.61–1.24)
GFR calc Af Amer: >60 mL/min
GFR calc non Af Amer: >60 mL/min
Glucose, Bld: 113 mg/dL — ABNORMAL HIGH (ref 70–99)
Potassium: 3.4 mmol/L — ABNORMAL LOW (ref 3.5–5.1)
Sodium: 132 mmol/L — ABNORMAL LOW (ref 135–145)

## 2019-01-10 LAB — BPAM RBC
Blood Product Expiration Date: 202005072359
Blood Product Expiration Date: 202005082359
ISSUE DATE / TIME: 202004270902
ISSUE DATE / TIME: 202004281027
Unit Type and Rh: 5100
Unit Type and Rh: 5100

## 2019-01-10 LAB — GLUCOSE, CAPILLARY
Glucose-Capillary: 98 mg/dL (ref 70–99)
Glucose-Capillary: 174 mg/dL — ABNORMAL HIGH (ref 70–99)
Glucose-Capillary: 200 mg/dL — ABNORMAL HIGH (ref 70–99)
Glucose-Capillary: 155 mg/dL — ABNORMAL HIGH (ref 70–99)

## 2019-01-10 LAB — C-REACTIVE PROTEIN: CRP: 11.3 mg/dL — ABNORMAL HIGH (ref <1.0)

## 2019-01-10 LAB — SEDIMENTATION RATE: Sed Rate: 117 mm/h — ABNORMAL HIGH (ref 0–16)

## 2019-01-10 MED ORDER — MAGNESIUM SULFATE 2 GM/50ML IV SOLN
2.0000 g | Freq: Once | INTRAVENOUS | Status: AC
  Administered 2019-01-10: 2 g via INTRAVENOUS
  Filled 2019-01-10: qty 50

## 2019-01-10 MED ORDER — POTASSIUM CHLORIDE 20 MEQ PO PACK
40.0000 meq | PACK | ORAL | Status: AC
  Administered 2019-01-10 (×2): 40 meq via ORAL
  Filled 2019-01-10 (×2): qty 2

## 2019-01-10 NOTE — Progress Notes (Signed)
[facility] PROGRESS NOTE  Mark Frey:[id number] DOB: [birth year] DOA: 01/01/2019 PCP: [name], MD  Assessment/Plan:  Neutropenic fever in patient with b cell lymphoma and radiation colitis, improved. No fever in last 48 hours. Blood cultures with no growth. Will stop cefepime today given 7 total days of therapy and negative culture data.  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 Monitor daily ESR and CRP for markers of inflammation, infectious workup with C diff, GIP panel, CMV, EBV all negative.. GI following, stopped fatty acid enema due to neutropenia. Continue cholestyramine added on 4/28 for high ostomy output and reassess. Hopeful patient will be able to improve oral intake otherwise may need to consider TPN. On day 6 of flagyl  Diminished nutrition. Supplementations provided by nutrition. Eating at least 50% of meals. May need to consider calorie counter for more objective data  High grade B-cell lymphoma. 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  No longer bleeding.  Hemoglobin remains stable. Received 1 U PRBC so far this hospital stay  HX of DVT/Pe. Holding eliquis while thrombocytopenic and GI bleeding on admission from severe radiation colitis.  Continue SCDs for DVT prophylaxis  Hypovolemic hyponatremia, stable  Hypokalemia and hypomagnesemia  Supplement orally and intravenously respectively, check mag and K in am.  Code Status: Full code Family Communication: No family at bedside (indicate person spoken with, relationship, and if by phone, the number) Disposition Plan: Improvement in neutropenia, completion of IV antibiotics, Coumadin bowel movements/lumbar support,  stability of hemoglobin/platelets, stable/improved nutrition   Consultants:  GI, oncology  Procedures:  None  Antibiotics: Cefepime 4/23-4/29 Flagyl 4/27- Vancomycin 4/23- HPI/Subjective:  Mark Frey is a 63 y.o. 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, DVT/PE on Eliquis, diabetes type 2  who presented on 01/01/2019 with diarrhea for 2 to 3 weeks and severe weakness and was found to have radiation-induced severe pancolitis with electrolyte derangements with course complicated by neutropenic fever  This morning states he ate breakfast well Denies any current belly pain Had some loose stool which is normal for him overnight Denies any bleeding per rectum   Objective: Vitals:   01/09/19 2025 01/10/19 0558  BP: 121/72 122/67  Pulse: (!) 107 96  Resp: 20 18  Temp: 99.2 F (37.3 C) 98 F (36.7 C)  SpO2: 96% 96%    Intake/Output Summary (Last 24 hours) at 01/10/2019 0930 Last data filed at 01/10/2019 0810 Gross per 24 hour  Intake 1852.49 ml  Output 2950 ml  Net -1097.51 ml   Filed Weights   01/01/19 1052 01/02/19 2050  Weight: 90.7 kg 86.3 kg    Exam:   General: Obese male lying in bed, comfortably no distress  Cardiovascular: Regular rate and rhythm, no appreciable murmurs rubs or gallops, no peripheral edema  Respiratory: Normal respiratory effort on room air  Abdomen: Soft, nondistended, nontender, diminished bowel sounds, liquid stool in ostomy bag  Musculoskeletal: Normal range of motion  Skin no rashes or lesions  Neurologic no appreciable focal deficits  Data Reviewed: Basic Metabolic Panel: Recent Labs  Lab 01/05/19 0314 01/05/19 1400 01/06/19 0423 01/07/19 0535 01/07/19 4401 01/08/19 0212 01/09/19  2706 01/10/19 0538  NA 131*  --  131* 131*  --  131* 131* 132*  K 3.0*  --   3.1* 3.2*  --  3.3* 3.2* 3.4*  CL 103  --  103 103  --  103 103 103  CO2 21*  --  21* 21*  --  20* 22 22  GLUCOSE 110*  --  104* 110*  --  120* 108* 113*  BUN 8  --  8 9  --  8 8 7*  CREATININE 0.53*  --  0.52* 0.51*  --  0.49* 0.37* 0.49*  CALCIUM 7.2*  --  7.1* 7.3*  --  7.1* 7.2* 7.4*  MG 1.2*  --   --   --  1.4* 1.7 1.4* 1.6*  PHOS  --  2.0*  --   --   --   --   --   --    Liver Function Tests: Recent Labs  Lab 01/09/19 1145  AST 17  ALT 40  ALKPHOS 142*  BILITOT 1.1  PROT 4.6*  ALBUMIN 1.6*   No results for input(s): LIPASE, AMYLASE in the last 168 hours. No results for input(s): AMMONIA in the last 168 hours. CBC: Recent Labs  Lab 01/06/19 0423 01/07/19 0535 01/08/19 0212 01/08/19 1326 01/09/19 0353 01/10/19 0538  WBC 1.5* 1.5* 1.3*  --  1.2* 1.3*  NEUTROABS 1.1* 1.1* 1.0*  --  0.9* 0.9*  HGB 7.5* 7.4* 6.7*  --  6.9* 7.8*  HCT 22.5* 22.9* 20.7*  --  21.2* 23.3*  MCV 93.4 95.4 95.8  --  93.8 92.8  PLT 42* 41* 37* 37* 34* 36*   Cardiac Enzymes: No results for input(s): CKTOTAL, CKMB, CKMBINDEX, TROPONINI in the last 168 hours. BNP (last 3 results) No results for input(s): BNP in the last 8760 hours.  ProBNP (last 3 results) No results for input(s): PROBNP in the last 8760 hours.  CBG: Recent Labs  Lab 01/09/19 0726 01/09/19 1225 01/09/19 1622 01/09/19 2022 01/10/19 0735  GLUCAP 110* 185* 110* 151* 98    Recent Results (from the past 240 hour(s))  Gastrointestinal Panel by PCR , Stool     Status: None   Collection Time: 01/01/19  3:36 PM  Result Value Ref Range Status   Campylobacter species NOT DETECTED NOT DETECTED Final   Plesimonas shigelloides NOT DETECTED NOT DETECTED Final   Salmonella species NOT DETECTED NOT DETECTED Final   Yersinia enterocolitica NOT DETECTED NOT DETECTED Final   Vibrio species NOT DETECTED NOT DETECTED Final   Vibrio cholerae NOT DETECTED NOT DETECTED Final   Enteroaggregative E coli (EAEC) NOT DETECTED NOT DETECTED  Final   Enteropathogenic E coli (EPEC) NOT DETECTED NOT DETECTED Final   Enterotoxigenic E coli (ETEC) NOT DETECTED NOT DETECTED Final   Shiga like toxin producing E coli (STEC) NOT DETECTED NOT DETECTED Final   E. coli O157 NOT DETECTED NOT DETECTED Final   Shigella/Enteroinvasive E coli (EIEC) NOT DETECTED NOT DETECTED Final   Cryptosporidium NOT DETECTED NOT DETECTED Final   Cyclospora cayetanensis NOT DETECTED NOT DETECTED Final   Entamoeba histolytica NOT DETECTED NOT DETECTED Final   Giardia lamblia NOT DETECTED NOT DETECTED Final   Adenovirus F40/41 NOT DETECTED NOT DETECTED Final   Astrovirus NOT DETECTED NOT DETECTED Final   Norovirus GI/GII NOT DETECTED NOT DETECTED Final   Rotavirus A NOT DETECTED NOT DETECTED Final   Sapovirus (I, II, IV, and V) NOT DETECTED NOT DETECTED Final    Comment: Performed  at [facility], [place], [place] [address]  C Difficile Quick Screen w PCR reflex     Status: None   Collection Time: 01/01/19  3:41 PM  Result Value Ref Range Status   C Diff antigen NEGATIVE NEGATIVE Final   C Diff toxin NEGATIVE NEGATIVE Final   C Diff interpretation No C. difficile detected.  Final    Comment: Performed at [facility], [place] [address] [place], [place] [address]  MRSA PCR Screening     Status: None   Collection Time: 01/01/19  3:54 PM  Result Value Ref Range Status   MRSA by PCR NEGATIVE NEGATIVE Final    Comment:        The GeneXpert MRSA Assay (FDA approved for NASAL specimens only), is one component of a comprehensive MRSA colonization surveillance program. It is not intended to diagnose MRSA infection nor to guide or monitor treatment for MRSA infections. Performed at [facility], [place] [address] [place], [place] [address]   Culture, blood (routine x 2)     Status: None   Collection Time: 01/04/19  9:00 AM  Result Value Ref Range Status   Specimen Description   Final    BLOOD  PORTA CATH Performed at [facility] [address] [place], [place] [address]    Special Requests   Final    BOTTLES DRAWN AEROBIC AND ANAEROBIC Blood Culture adequate volume Performed at [facility] [address] [place], [place] [address]    Culture   Final    NO GROWTH 5 DAYS Performed at [facility], [place] [address], [place], [place] [address]    Report Status 01/09/2019 FINAL  Final  Culture, blood (routine x 2)     Status: None   Collection Time: 01/04/19 10:09 AM  Result Value Ref Range Status   Specimen Description   Final    BLOOD RIGHT HAND Performed at [facility] [address], [place], [place] [address]    Special Requests   Final    BOTTLES DRAWN AEROBIC AND ANAEROBIC Blood Culture adequate volume Performed at [facility] [address], [place], [place] [address]    Culture   Final    NO GROWTH 5 DAYS Performed at [facility], [place] [address] [place],  [address]    Report Status 01/09/2019 FINAL  Final     Studies: No results found.  Scheduled Meds: . B-complex with vitamin C  1 tablet Oral Daily  . cholestyramine light  4 g Oral 3 times per day  . feeding supplement (ENSURE ENLIVE)  237 mL Oral TID BM  . feeding supplement (PRO-STAT SUGAR FREE 64)  30 mL Oral BID  . insulin aspart  0-5 Units Subcutaneous QHS  . insulin aspart  0-9 Units Subcutaneous TID WC  . Tbo-filgastrim (GRANIX) SQ  480 mcg Subcutaneous q1800  . zinc sulfate  220 mg Oral BID   Continuous Infusions: . sodium chloride Stopped (01/10/19 0557)  . ceFEPime (MAXIPIME) IV 200 mL/hr at 01/10/19 0600  . magnesium sulfate 1 - 4 g bolus IVPB    . metronidazole Stopped (01/10/19 0556)    Principal Problem:   Hyponatremia Active Problems:   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HCC)   Hypokalemia   Diarrhea   Severe protein-calorie malnutrition (HCC)   Pancytopenia (HCC)   Hematochezia    Radiation gastroenteritis   Radiation colitis  Malnutrition of moderate degree   Zinc deficiency   Pressure injury of skin   Other neutropenia ([place])      Desiree Hane  [facility]

## 2019-01-10 NOTE — Progress Notes (Signed)
I spoke with the patient's wife by telephone today (01-10-19).  It has been a hard couple of weeks. However, the good news is that he ate a sausage biscuit today and enjoyed it.  We discussed his hospital course and I gave her emotional support.  She appreciated the call, and I appreciate the excellent inpatient care he is receiving. -----------------------------------  [name], MD

## 2019-01-10 NOTE — Progress Notes (Signed)
Pharmacy Antibiotic Note  Mark Frey is a 63 y.o. male with PMH Burkitt's Lymphoma s/p chemo/rads and diverting loop ileostomy, admitted on 01/01/2019 with increased ostomy output and blood/mucus from rectum d/t radiation colitis. On 4/23, patient noted to have new fevers as well as low WBC and ANC 800.   Pharmacy consulted for cefepime dosing.   Today, 01/10/19  Day 7 Cefepime  WBC 1.3 low but stable (ANC 0.9)  Afebrile  Cultures negative to date  Plan:  Continue cefepime 2 g IV q 8 hr, BUT recommend discontinuing due to negative cultures and afebrile  Height: 5' 9"  (175.3 cm) Weight: 190 lb 4.1 oz (86.3 kg) IBW/kg (Calculated) : 70.7  Temp (24hrs), Avg:98.3 F (36.8 C), Min:97.6 F (36.4 C), Max:99.2 F (37.3 C)  Recent Labs  Lab 01/06/19 0423 01/07/19 0535 01/08/19 0212 01/09/19 0353 01/10/19 0538  WBC 1.5* 1.5* 1.3* 1.2* 1.3*  CREATININE 0.52* 0.51* 0.49* 0.37* 0.49*    Estimated Creatinine Clearance: 104.1 mL/min (A) (by C-G formula based on SCr of 0.49 mg/dL (L)).    Allergies  Allergen Reactions  . Ciprofloxacin Other (See Comments)    Leg tingling    Thank you for allowing pharmacy to be a part of this patient's care.  Adrian Saran, PharmD, BCPS 01/10/2019 9:24 AM

## 2019-01-10 NOTE — Progress Notes (Signed)
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  Thought to be radiation induced diarrhea due to radiation colitis    PT Comments    Assisted OOB to amb a functional distance in hallway.  Follow Up Recommendations  Home health PT     Equipment Recommendations  None recommended by PT    Recommendations for Other Services       Precautions / Restrictions Precautions Precautions: None    Mobility  Bed Mobility Overal bed mobility: Modified Independent             General bed mobility comments: increased time   Transfers Overall transfer level: Modified independent Equipment used: Rolling walker (2 wheeled) Transfers: Sit to/from [employer] Transfers Sit to Stand: Modified independent (Device/Increase time) Stand pivot transfers: Modified independent (Device/Increase time)       General transfer comment: good use of hands to steady self  Ambulation/Gait Ambulation/Gait assistance: Supervision Gait Distance (Feet): 185 Feet Assistive device: Rolling walker (2 wheeled) Gait Pattern/deviations: Step-through pattern     General Gait Details: decreased speed but functional.  Mild use for walker to steady self.     Stairs             Wheelchair Mobility    Modified Rankin (Stroke Patients Only)       Balance                                            Cognition Arousal/Alertness: Awake/alert Behavior During Therapy: WFL for tasks assessed/performed Overall Cognitive Status: Within Functional Limits for tasks assessed                                         Exercises      General Comments        Pertinent Vitals/Pain Pain Assessment: No/denies pain    Home Living                      Prior Function            PT Goals (current goals can now be found in the care plan section) Progress towards PT goals: Progressing toward goals    Frequency    Min 3X/week      PT Plan      Co-evaluation              AM-PAC PT "6 Clicks" Mobility   Outcome Measure  Help needed turning from your back to your side while in a flat bed without using bedrails?: None Help needed moving from lying on your back to sitting on the side of a flat bed without using bedrails?: None Help needed moving to and from a bed to a chair (including a wheelchair)?: None Help needed standing up from a chair using your arms (e.g., wheelchair or bedside chair)?: A Little Help needed to walk in hospital room?: A Little Help needed climbing 3-5  steps with a railing? : A Little 6 Click Score: 21    End of Session Equipment Utilized During Treatment: Gait belt Activity Tolerance: Patient limited by fatigue;Patient tolerated treatment well Patient left: in bed;with call bell/phone within reach Nurse Communication: Mobility status PT Visit Diagnosis: Difficulty in walking, not elsewhere classified (R26.2)     Time: 0881-1031 PT Time Calculation (min) (ACUTE ONLY): 15 min  Charges:  $Gait Training: 8-22 mins                     Rica Koyanagi  PTA Acute  Rehabilitation Services Pager      [phone] Office      [phone]

## 2019-01-11 ENCOUNTER — Telehealth: Payer: Self-pay | Admitting: *Deleted

## 2019-01-11 DIAGNOSIS — D709 Neutropenia, unspecified: Secondary | ICD-10-CM | POA: Clinically undetermined

## 2019-01-11 LAB — CBC WITH DIFFERENTIAL/PLATELET
Abs Immature Granulocytes: 0.09 10*3/uL — ABNORMAL HIGH (ref 0.00–0.07)
Basophils Absolute: 0 10*3/uL (ref 0.0–0.1)
Basophils Relative: 2 %
Eosinophils Absolute: 0 10*3/uL (ref 0.0–0.5)
Eosinophils Relative: 1 %
HCT: 25.3 % — ABNORMAL LOW (ref 39.0–52.0)
Hemoglobin: 7.8 g/dL — ABNORMAL LOW (ref 13.0–17.0)
Immature Granulocytes: 7 %
Lymphocytes Relative: 12 %
Lymphs Abs: 0.2 10*3/uL — ABNORMAL LOW (ref 0.7–4.0)
MCH: 29.5 pg (ref 26.0–34.0)
MCHC: 30.8 g/dL (ref 30.0–36.0)
MCV: 95.8 fL (ref 80.0–100.0)
Monocytes Absolute: 0.2 10*3/uL (ref 0.1–1.0)
Monocytes Relative: 16 %
Neutro Abs: 0.8 10*3/uL — ABNORMAL LOW (ref 1.7–7.7)
Neutrophils Relative %: 62 %
Platelets: 41 10*3/uL — ABNORMAL LOW (ref 150–400)
RBC: 2.64 MIL/uL — ABNORMAL LOW (ref 4.22–5.81)
RDW: 18.5 % — ABNORMAL HIGH (ref 11.5–15.5)
WBC: 1.3 10*3/uL — CL (ref 4.0–10.5)
nRBC: 0 % (ref 0.0–0.2)

## 2019-01-11 LAB — GLUCOSE, CAPILLARY
Glucose-Capillary: 111 mg/dL — ABNORMAL HIGH (ref 70–99)
Glucose-Capillary: 173 mg/dL — ABNORMAL HIGH (ref 70–99)
Glucose-Capillary: 100 mg/dL — ABNORMAL HIGH (ref 70–99)
Glucose-Capillary: 198 mg/dL — ABNORMAL HIGH (ref 70–99)

## 2019-01-11 LAB — COMPREHENSIVE METABOLIC PANEL
ALT: 28 U/L (ref 0–44)
AST: 17 U/L (ref 15–41)
Albumin: 1.5 g/dL — ABNORMAL LOW (ref 3.5–5.0)
Alkaline Phosphatase: 170 U/L — ABNORMAL HIGH (ref 38–126)
Anion gap: 6 (ref 5–15)
BUN: 7 mg/dL — ABNORMAL LOW (ref 8–23)
CO2: 22 mmol/L (ref 22–32)
Calcium: 7.3 mg/dL — ABNORMAL LOW (ref 8.9–10.3)
Chloride: 106 mmol/L (ref 98–111)
Creatinine, Ser: 0.39 mg/dL — ABNORMAL LOW (ref 0.61–1.24)
GFR calc Af Amer: >60 mL/min (ref >60)
GFR calc non Af Amer: >60 mL/min (ref >60)
Glucose, Bld: 117 mg/dL — ABNORMAL HIGH (ref 70–99)
Potassium: 3.9 mmol/L (ref 3.5–5.1)
Sodium: 134 mmol/L — ABNORMAL LOW (ref 135–145)
Total Bilirubin: 0.6 mg/dL (ref 0.3–1.2)
Total Protein: 4.2 g/dL — ABNORMAL LOW (ref 6.5–8.1)

## 2019-01-11 LAB — MAGNESIUM: Magnesium: 1.6 mg/dL — ABNORMAL LOW (ref 1.7–2.4)

## 2019-01-11 MED ORDER — DRONABINOL 2.5 MG PO CAPS
2.5000 mg | ORAL_CAPSULE | Freq: Two times a day (BID) | ORAL | Status: DC
  Administered 2019-01-11 – 2019-01-12 (×3): 2.5 mg via ORAL
  Filled 2019-01-11 (×3): qty 1

## 2019-01-11 MED ORDER — DEXAMETHASONE 4 MG PO TABS
4.0000 mg | ORAL_TABLET | Freq: Every day | ORAL | Status: DC
  Administered 2019-01-11 – 2019-01-16 (×6): 4 mg via ORAL
  Filled 2019-01-11 (×6): qty 1

## 2019-01-11 NOTE — Telephone Encounter (Signed)
CALLED PATIENT TO INFORM OF FU APPT. WITH DR. Isidore Moos ON 04-13-19 @ 2 PM, LVM FOR A RETURN CALL

## 2019-01-11 NOTE — Progress Notes (Signed)
NUTRITION NOTE RD working remotely.  Patient last seen remotely by this RD on 4/28 and in person on 4/22. Per RN flow sheet, patient consumed 100% of breakfast and lunch and 25% of dinner.   Consult for Calorie Count. Will place order for this and follow-up with results tomorrow (5/1) and Monday (5/4)--if patient remains hospitalized through the weekend.      Jarome Matin, MS, RD, LDN, [facility] Inpatient Clinical Dietitian Pager # [phone] After hours/weekend pager # [phone]

## 2019-01-11 NOTE — Progress Notes (Signed)
[facility] PROGRESS NOTE  Mark Frey UMP:[id number] DOB: 1956-05-23 DOA: 01/01/2019 PCP: [name], MD  Assessment/Plan:  Neutropenic fever in patient with b cell lymphoma and radiation colitis, improved. No fever greater than 48 hours. Blood cultures with no growth.  Has completed cefepime and Flagyl therapy.  Oncology recently increased his Granix dosing to improve neutropenia on 4/27, will discuss with oncology if any plans for further up titration.  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 ESR and CRP have both down trended, infectious workup with C diff, GIP panel, CMV, EBV all negative.. GI following, stopped fatty acid enema due to neutropenia. Continue cholestyramine added on 4/28 for high ostomy output and reassess. Hopeful patient will be able to improve oral intake otherwise may need to consider TPN.  Has completed Flagyl therapy.  Diminished nutrition. Supplementations provided by nutrition. Eating at least 50% of meals.  Currently undergoing calorie count for objective data to determine need for potential TPN.   High grade B-cell lymphoma. Oncology following, undergoing outpatient radiation therapy.  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  No longer bleeding.  Hemoglobin remains stable. Received 1 U PRBC so far this hospital stay  HX of DVT/Pe. Holding eliquis while thrombocytopenic and GI bleeding on admission from severe radiation colitis.  Continue SCDs for DVT prophylaxis  Hypovolemic hyponatremia, stable improved, previous nadir of 120, currently ranging 131-134.  Monitor BMP.  Will discontinue IV fluids for now and closely monitor  Hypokalemia and hypomagnesemia, improving supplement orally and intravenously respectively, check mag and K as  needed  Zinc deficiency.  Receiving zinc supplementation daily  Code Status: Full code Family Communication: No family at bedside (indicate person spoken with, relationship, and if by phone, the number) Disposition Plan: Improvement in neutropenia, stability of hemoglobin/platelets, stable/improved nutrition, currently undergoing calorie count in case TPN is needed.  Will need to discuss with oncology a plans to increase Granix dosing for persistent neutropenia   Consultants:  GI, oncology  Procedures:  None  Antibiotics: Cefepime 4/23-4/29 Flagyl 4/27- Vancomycin 4/23- HPI/Subjective:  Mark Frey is a 63 y.o. 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, DVT/PE on Eliquis, diabetes type 2  who presented on 01/01/2019 with diarrhea for 2 to 3 weeks and severe weakness and was found to have radiation-induced severe pancolitis with electrolyte derangements with course complicated by neutropenic fever  Eating lunch No abdominal pain No rectal bleeding Notes improvement in appetite   Objective: Vitals:   01/11/19 0629 01/11/19 1355  BP: 130/73 129/73  Pulse: 97 (!) 108  Resp: 13 (!) 21  Temp: 98.4 F (36.9 C) 98.2 F (36.8 C)  SpO2: 97% 96%    Intake/Output Summary (Last 24 hours) at 01/11/2019 1513 Last data filed at 01/11/2019 1414 Gross per 24 hour  Intake 1919.2 ml  Output 3225 ml  Net -1305.8 ml   Filed Weights   01/01/19 1052 01/02/19 2050  Weight: 90.7 kg 86.3 kg    Exam:   General: Obese male lying in bed, comfortably no distress  Cardiovascular: Regular rate and rhythm, no appreciable murmurs rubs or gallops, no peripheral edema  Respiratory: Normal respiratory effort on room air  Abdomen: Soft, nondistended, nontender, diminished bowel  sounds, liquid stool in ostomy bag  Musculoskeletal:  Normal range of motion  Skin no rashes or lesions  Neurologic no appreciable focal deficits  Data Reviewed: Basic Metabolic Panel: Recent Labs  Lab 01/05/19 0314 01/05/19 1400  01/07/19 0535 01/07/19 0811 01/08/19 0212 01/09/19 0353 01/10/19 0538 01/11/19 0353  NA 131*  --    < > 131*  --  131* 131* 132* 134*  K 3.0*  --    < > 3.2*  --  3.3* 3.2* 3.4* 3.9  CL 103  --    < > 103  --  103 103 103 106  CO2 21*  --    < > 21*  --  20* 22 22 22   GLUCOSE 110*  --    < > 110*  --  120* 108* 113* 117*  BUN 8  --    < > 9  --  8 8 7* 7*  CREATININE 0.53*  --    < > 0.51*  --  0.49* 0.37* 0.49* 0.39*  CALCIUM 7.2*  --    < > 7.3*  --  7.1* 7.2* 7.4* 7.3*  MG 1.2*  --   --   --  1.4* 1.7 1.4* 1.6*  --   PHOS  --  2.0*  --   --   --   --   --   --   --    < > = values in this interval not displayed.   Liver Function Tests: Recent Labs  Lab 01/09/19 1145 01/11/19 0353  AST 17 17  ALT 40 28  ALKPHOS 142* 170*  BILITOT 1.1 0.6  PROT 4.6* 4.2*  ALBUMIN 1.6* 1.5*   No results for input(s): LIPASE, AMYLASE in the last 168 hours. No results for input(s): AMMONIA in the last 168 hours. CBC: Recent Labs  Lab 01/07/19 0535 01/08/19 0212 01/08/19 1326 01/09/19 0353 01/10/19 0538 01/11/19 0353  WBC 1.5* 1.3*  --  1.2* 1.3* 1.3*  NEUTROABS 1.1* 1.0*  --  0.9* 0.9* 0.8*  HGB 7.4* 6.7*  --  6.9* 7.8* 7.8*  HCT 22.9* 20.7*  --  21.2* 23.3* 25.3*  MCV 95.4 95.8  --  93.8 92.8 95.8  PLT 41* 37* 37* 34* 36* 41*   Cardiac Enzymes: No results for input(s): CKTOTAL, CKMB, CKMBINDEX, TROPONINI in the last 168 hours. BNP (last 3 results) No results for input(s): BNP in the last 8760 hours.  ProBNP (last 3 results) No results for input(s): PROBNP in the last 8760 hours.  CBG: Recent Labs  Lab 01/10/19 1135 01/10/19 1622 01/10/19 1948 01/11/19 0727 01/11/19 1152  GLUCAP 174* 200* 155* 111* 173*    Recent Results (from the past 240 hour(s))  Gastrointestinal Panel by PCR ,  Stool     Status: None   Collection Time: 01/01/19  3:36 PM  Result Value Ref Range Status   Campylobacter species NOT DETECTED NOT DETECTED Final   Plesimonas shigelloides NOT DETECTED NOT DETECTED Final   Salmonella species NOT DETECTED NOT DETECTED Final   Yersinia enterocolitica NOT DETECTED NOT DETECTED Final   Vibrio species NOT DETECTED NOT DETECTED Final   Vibrio cholerae NOT DETECTED NOT DETECTED Final   Enteroaggregative E coli (EAEC) NOT DETECTED NOT DETECTED Final   Enteropathogenic E coli (EPEC) NOT DETECTED NOT DETECTED Final   Enterotoxigenic E coli (ETEC) NOT DETECTED NOT DETECTED Final   Shiga like toxin producing E coli (STEC) NOT DETECTED NOT DETECTED Final   E. coli  O157 NOT DETECTED NOT DETECTED Final   Shigella/Enteroinvasive E coli (EIEC) NOT DETECTED NOT DETECTED Final   Cryptosporidium NOT DETECTED NOT DETECTED Final   Cyclospora cayetanensis NOT DETECTED NOT DETECTED Final   Entamoeba histolytica NOT DETECTED NOT DETECTED Final   Giardia lamblia NOT DETECTED NOT DETECTED Final   Adenovirus F40/41 NOT DETECTED NOT DETECTED Final   Astrovirus NOT DETECTED NOT DETECTED Final   Norovirus GI/GII NOT DETECTED NOT DETECTED Final   Rotavirus A NOT DETECTED NOT DETECTED Final   Sapovirus (I, II, IV, and V) NOT DETECTED NOT DETECTED Final    Comment: Performed at [facility], [address] [place], [place] [address]  C Difficile Quick Screen w PCR reflex     Status: None   Collection Time: 01/01/19  3:41 PM  Result Value Ref Range Status   C Diff antigen NEGATIVE NEGATIVE Final   C Diff toxin NEGATIVE NEGATIVE Final   C Diff interpretation No C. difficile detected.  Final    Comment: Performed at [facility], [place] [address], [place], [place] [address]  MRSA PCR Screening     Status: None   Collection Time: 01/01/19  3:54 PM  Result Value Ref Range Status   MRSA by PCR NEGATIVE NEGATIVE Final    Comment:        The GeneXpert MRSA  Assay (FDA approved for NASAL specimens only), is one component of a comprehensive MRSA colonization surveillance program. It is not intended to diagnose MRSA infection nor to guide or monitor treatment for MRSA infections. Performed at [facility], [place] [address], [place], [place] [address]   Culture, blood (routine x 2)     Status: None   Collection Time: 01/04/19  9:00 AM  Result Value Ref Range Status   Specimen Description   Final    BLOOD PORTA CATH Performed at [facility] [address], [place], [place] [address]    Special Requests   Final    BOTTLES DRAWN AEROBIC AND ANAEROBIC Blood Culture adequate volume Performed at [facility] [address], [place], [place] [address]    Culture   Final    NO GROWTH 5 DAYS Performed at [facility], [place] [address] [place], [place] [address]    Report Status 01/09/2019 FINAL  Final  Culture, blood (routine x 2)     Status: None   Collection Time: 01/04/19 10:09 AM  Result Value Ref Range Status   Specimen Description   Final    BLOOD RIGHT HAND Performed at [facility] [address], [place], [place] [address]    Special Requests   Final    BOTTLES DRAWN AEROBIC AND ANAEROBIC Blood Culture adequate volume Performed at [facility] [address], [place], [place] [address]    Culture   Final    NO GROWTH 5 DAYS Performed at [facility], [place] [address] [place],  [address]    Report Status 01/09/2019 FINAL  Final     Studies: No results found.  Scheduled Meds: . B-complex with vitamin C  1 tablet Oral Daily  . cholestyramine light  4 g Oral 3 times per day  . feeding supplement (ENSURE ENLIVE)  237 mL Oral TID BM  . feeding supplement (PRO-STAT SUGAR FREE 64)  30 mL Oral BID  . insulin aspart  0-5 Units Subcutaneous QHS  . insulin aspart  0-9 Units Subcutaneous TID  WC  . Tbo-filgastrim  (GRANIX) SQ  480 mcg Subcutaneous q1800  . zinc sulfate  220 mg Oral BID   Continuous Infusions: . sodium chloride 75 mL/hr at 01/11/19 7824    Principal Problem:   Hyponatremia Active Problems:   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HCC)   Hypokalemia   Diarrhea   Severe protein-calorie malnutrition (HCC)   Pancytopenia (HCC)   Hematochezia   Radiation gastroenteritis   Radiation colitis   Malnutrition of moderate degree   Zinc deficiency   Pressure injury of skin   Other neutropenia ([place])      Desiree Hane  [facility]

## 2019-01-12 ENCOUNTER — Telehealth: Payer: Self-pay | Admitting: Gastroenterology

## 2019-01-12 LAB — COMPREHENSIVE METABOLIC PANEL
ALT: 23 U/L (ref 0–44)
AST: 11 U/L — ABNORMAL LOW (ref 15–41)
Albumin: 1.8 g/dL — ABNORMAL LOW (ref 3.5–5.0)
Alkaline Phosphatase: 158 U/L — ABNORMAL HIGH (ref 38–126)
Anion gap: 7 (ref 5–15)
BUN: 9 mg/dL (ref 8–23)
CO2: 22 mmol/L (ref 22–32)
Calcium: 7.7 mg/dL — ABNORMAL LOW (ref 8.9–10.3)
Chloride: 105 mmol/L (ref 98–111)
Creatinine, Ser: 0.35 mg/dL — ABNORMAL LOW (ref 0.61–1.24)
GFR calc Af Amer: >60 mL/min (ref >60)
GFR calc non Af Amer: >60 mL/min (ref >60)
Glucose, Bld: 171 mg/dL — ABNORMAL HIGH (ref 70–99)
Potassium: 4.2 mmol/L (ref 3.5–5.1)
Sodium: 134 mmol/L — ABNORMAL LOW (ref 135–145)
Total Bilirubin: 0.9 mg/dL (ref 0.3–1.2)
Total Protein: 4.5 g/dL — ABNORMAL LOW (ref 6.5–8.1)

## 2019-01-12 LAB — CBC WITH DIFFERENTIAL/PLATELET
Abs Immature Granulocytes: 0.09 10*3/uL — ABNORMAL HIGH (ref 0.00–0.07)
Basophils Absolute: 0 10*3/uL (ref 0.0–0.1)
Basophils Relative: 2 %
Eosinophils Absolute: 0 10*3/uL (ref 0.0–0.5)
Eosinophils Relative: 0 %
HCT: 26.4 % — ABNORMAL LOW (ref 39.0–52.0)
Hemoglobin: 8.1 g/dL — ABNORMAL LOW (ref 13.0–17.0)
Immature Granulocytes: 4 %
Lymphocytes Relative: 6 %
Lymphs Abs: 0.1 10*3/uL — ABNORMAL LOW (ref 0.7–4.0)
MCH: 29.9 pg (ref 26.0–34.0)
MCHC: 30.7 g/dL (ref 30.0–36.0)
MCV: 97.4 fL (ref 80.0–100.0)
Monocytes Absolute: 0.3 10*3/uL (ref 0.1–1.0)
Monocytes Relative: 14 %
Neutro Abs: 1.7 10*3/uL (ref 1.7–7.7)
Neutrophils Relative %: 74 %
Platelets: 44 10*3/uL — ABNORMAL LOW (ref 150–400)
RBC: 2.71 MIL/uL — ABNORMAL LOW (ref 4.22–5.81)
RDW: 18.3 % — ABNORMAL HIGH (ref 11.5–15.5)
WBC: 2.2 10*3/uL — ABNORMAL LOW (ref 4.0–10.5)
nRBC: 0 % (ref 0.0–0.2)

## 2019-01-12 LAB — GLUCOSE, CAPILLARY
Glucose-Capillary: 160 mg/dL — ABNORMAL HIGH (ref 70–99)
Glucose-Capillary: 251 mg/dL — ABNORMAL HIGH (ref 70–99)
Glucose-Capillary: 225 mg/dL — ABNORMAL HIGH (ref 70–99)
Glucose-Capillary: 191 mg/dL — ABNORMAL HIGH (ref 70–99)

## 2019-01-12 LAB — MAGNESIUM: Magnesium: 2 mg/dL (ref 1.7–2.4)

## 2019-01-12 MED ORDER — MAGNESIUM SULFATE 2 GM/50ML IV SOLN
2.0000 g | Freq: Once | INTRAVENOUS | Status: AC
  Administered 2019-01-12: 2 g via INTRAVENOUS
  Filled 2019-01-12: qty 50

## 2019-01-12 MED ORDER — OXYCODONE HCL 5 MG PO TABS
5.0000 mg | ORAL_TABLET | ORAL | Status: AC | PRN
  Administered 2019-01-12 – 2019-01-13 (×2): 5 mg via ORAL
  Filled 2019-01-12 (×2): qty 1

## 2019-01-12 NOTE — Progress Notes (Addendum)
HEMATOLOGY/ONCOLOGY INPATIENT PROGRESS NOTE  Date of Service: 01/11/2019  Inpatient Attending: .Desiree Hane, MD   SUBJECTIVE:   Mark Frey was seen in follow-up.  I visited him and had his wife [name] on the phone.  No fevers.  Off antibiotics.  Gradually increasing p.o. intake.  Notes that he has had better success with food that his wife has been sending from home.  Calorie count in progress to determine need for TPN.  Notes 1 bowel movement without significant blood.  Tolerating Granix shots.  Still neutropenic today.  Provided strong encouragement to the patient to try to ambulate as much as possible and try to eat small infrequent meals as much as possible. He is keen to go home and we defined the criteria he needs to meet to be able to do that. He is off IV fluids at this time.  OBJECTIVE:  Resting comfortably in bed  PHYSICAL EXAMINATION: . Vitals:   01/11/19 1355     BP: 129/73     Pulse: (!) 108     Resp: (!) 21     Temp: 98.2 F (36.8 C)     TempSrc: Oral     SpO2: 96%     Weight:      Height:       Filed Weights   01/01/19 1052 01/02/19 2050  Weight: 200 lb (90.7 kg) 190 lb 4.1 oz (86.3 kg)   .Body mass index is 28.1 kg/m.  GENERAL: Resting, somewhat pale appearing, in no acute distress. OROPHARYNX: Oral mucosa moist LUNGS: clear to auscultation HEART: regular rate & rhythm ABDOMEN: Tenderness to deep palpation over the left part of the abdomen especially left lower quadrant Musculoskeletal: 1+ pedal edema PSYCH: alert & oriented x 3 .  Appears somewhat down emotionally. NEURO: no focal motor/sensory deficits  MEDICAL HISTORY:  Past Medical History:  Diagnosis Date   ALLERGIC RHINITIS    Cancer ([place])    Lymphoma    Diabetes mellitus    Hyperlipidemia     SURGICAL HISTORY: Past Surgical History:  Procedure Laterality Date   BIOPSY  03/15/2018   Procedure: BIOPSY;  Surgeon: Milus Banister, MD;  Location: [facility];  Service:  Endoscopy;;   BOWEL RESECTION N/A 08/25/2018   Procedure: LAPROSCOPIC LOOP ILEOSTOMY;  Surgeon: Greer Pickerel, MD;  Location: [name];  Service: General;  Laterality: N/A;   CLEFT PALATE REPAIR     COLONOSCOPY N/A 03/15/2018   Procedure: COLONOSCOPY;  Surgeon: Milus Banister, MD;  Location: [name];  Service: Endoscopy;  Laterality: N/A;   COLONOSCOPY N/A 08/20/2018   Procedure: COLONOSCOPY;  Surgeon: Irene Shipper, MD;  Location: [facility];  Service: Endoscopy;  Laterality: N/A;   deviated septum repair     slight improvement   ESOPHAGOGASTRODUODENOSCOPY N/A 03/15/2018   Procedure: ESOPHAGOGASTRODUODENOSCOPY (EGD);  Surgeon: Milus Banister, MD;  Location: [name];  Service: Endoscopy;  Laterality: N/A;   IR IMAGING GUIDED PORT INSERTION  03/17/2018   LAPAROSCOPY N/A 08/25/2018   Procedure: LAPAROSCOPY DIAGNOSTIC;  Surgeon: Greer Pickerel, MD;  Location: [facility];  Service: General;  Laterality: N/A;   TONSILLECTOMY      SOCIAL HISTORY: Social History   Socioeconomic History   Marital status: Married    Spouse name: lisa   Number of children: 0   Years of education: Not on file   Highest education level: Not on file  Occupational History   Not on file  Social Needs   Financial resource  strain: Not hard at all   Food insecurity:    Worry: Never true    Inability: Never true   Transportation needs:    Medical: No    Non-medical: No  Tobacco Use   Smoking status: Never Smoker   Smokeless tobacco: Never Used  Substance and Sexual Activity   Alcohol use: Yes    Comment: occasional   Drug use: No   Sexual activity: Yes  Lifestyle   Physical activity:    Days per week: 0 days    Minutes per session: 0 min   Stress: Not at all  Relationships   Social connections:    Talks on phone: More than three times a week    Gets together: More than three times a week    Attends religious service: 1 to 4 times per year    Active member of club or  organization: No    Attends meetings of clubs or organizations: Never    Relationship status: Married   Intimate partner violence:    Fear of current or ex partner: No    Emotionally abused: No    Physically abused: No    Forced sexual activity: No  Other Topics Concern   Not on file  Social History Narrative   Married 1985. No kids. 4 small dogs.       Works in Financial trader, residential      Hobbies: work on cars, Haematologist, exercise as able    FAMILY HISTORY: Family History  Problem Relation Age of Onset   Lung cancer Mother        smoker   Brain cancer Mother        metastasis   AAA (abdominal aortic aneurysm) Father        smoker    ALLERGIES:  is allergic to ciprofloxacin.  MEDICATIONS:  Scheduled Meds:  B-complex with vitamin C  1 tablet Oral Daily   cholestyramine light  4 g Oral 3 times per day   dexamethasone  4 mg Oral Q breakfast   dronabinol  2.5 mg Oral BID AC   feeding supplement (ENSURE ENLIVE)  237 mL Oral TID BM   feeding supplement (PRO-STAT SUGAR FREE 64)  30 mL Oral BID   insulin aspart  0-5 Units Subcutaneous QHS   insulin aspart  0-9 Units Subcutaneous TID WC   Tbo-filgastrim (GRANIX) SQ  480 mcg Subcutaneous q1800   zinc sulfate  220 mg Oral BID   Continuous Infusions:  PRN Meds:.acetaminophen **OR** acetaminophen, ondansetron **OR** ondansetron (ZOFRAN) IV, sodium chloride flush  REVIEW OF SYSTEMS:    10 Point review of Systems was done is negative except as noted above.   LABORATORY DATA:  I have reviewed the data as listed  . CBC Latest Ref Rng & Units 01/11/2019 01/10/2019  WBC 4.0 - 10.5 K/uL 1.3(LL) 1.3(LL)  Hemoglobin 13.0 - 17.0 g/dL 7.8(L) 7.8(L)  Hematocrit 39.0 - 52.0 % 25.3(L) 23.3(L)  Platelets 150 - 400 K/uL 41(L) 36(L)   ANC 900  CMP Latest Ref Rng & Units 01/11/2019 01/10/2019  Glucose 70 - 99 mg/dL 117(H) 113(H)  BUN 8 - 23 mg/dL 7(L) 7(L)  Creatinine 0.61 - 1.24 mg/dL 0.39(L) 0.49(L)    Sodium 135 - 145 mmol/L 134(L) 132(L)  Potassium 3.5 - 5.1 mmol/L 3.9 3.4(L)  Chloride 98 - 111 mmol/L 106 103  CO2 22 - 32 mmol/L 22 22  Calcium 8.9 - 10.3 mg/dL 7.3(L) 7.4(L)  Total Protein 6.5 - 8.1 g/dL 4.2(L) -  Total Bilirubin 0.3 - 1.2 mg/dL 0.6 -  Alkaline Phos 38 - 126 U/L 170(H) -  AST 15 - 41 U/L 17 -  ALT 0 - 44 U/L 28 -    RADIOGRAPHIC STUDIES: I have personally reviewed the radiological images as listed and agreed with the findings in the report. Dg Chest 2 View  Result Date: 01/01/2019 CLINICAL DATA:  Short of breath.  Recent chemotherapy for lymphoma EXAM: CHEST - 2 VIEW COMPARISON:  08/24/2018 FINDINGS: Heart size and vascularity normal. Mild linear densities in the right lung base are new and may be atelectasis. No pleural effusion. Port-A-Cath tip SVC. IMPRESSION: Mild right lower lobe atelectasis.  Negative for pneumonia. Electronically Signed   By: Franchot Gallo M.D.   On: 01/01/2019 19:38   Ct Abdomen Pelvis W Contrast  Result Date: 01/01/2019 CLINICAL DATA:  Diarrhea and weakness. History of Burkitt's lymphoma. Recently completed radiation. Ileostomy EXAM: CT ABDOMEN AND PELVIS WITH CONTRAST TECHNIQUE: Multidetector CT imaging of the abdomen and pelvis was performed using the standard protocol following bolus administration of intravenous contrast. CONTRAST:  167m OMNIPAQUE IOHEXOL 300 MG/ML  SOLN COMPARISON:  CT abdomen pelvis 10/05/2018 FINDINGS: Lower chest: Mild scarring in the lung bases. Negative for infiltrate or effusion. Hepatobiliary: Small subcentimeter hypodensities in the liver are unchanged and likely benign cysts. Hypodensity adjacent to the interhemispheric fissure unchanged compatible with fatty infiltration of the liver. Gallbladder contracted and mildly thickened. No biliary dilatation. Pancreas: Negative Spleen: Negative Adrenals/Urinary Tract: 5 mm right lower pole calculus and 2 mm right midpole calculus. No renal obstruction or mass. Negative  bladder. Stomach/Bowel: Ileostomy. Marked thickening of the entire colon from cecum to rectum with extensive stranding in the pericolonic fat. No free air or free fluid. Small bowel nondilated and non thickened. Vascular/Lymphatic: Minimal atherosclerotic disease in the aorta. Negative for lymphadenopathy. Reproductive: Moderate prostate enlargement with numerous calcifications. Other: Negative for abscess Musculoskeletal: Lumbar degenerative changes without acute skeletal abnormality. IMPRESSION: Findings compatible with severe colitis. Diffuse thickening of the colon with surrounding edema in the pericolonic fat. No abscess or free fluid. No small bowel obstruction. Ileostomy noted in the right lower quadrant. Negative for mass or adenopathy. Electronically Signed   By: CFranchot GalloM.D.   On: 01/01/2019 19:37    ASSESSMENT & PLAN:   63y.o. male with  1. 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 No BM or CNS involvement.   04/18/18 PET/CT revealed Continued improvement in right colonic wall thickening and surrounding bulky masses consistent with treated lymphoma. No hypermetabolic activity to suggest residual tumor. There is some hypermetabolic activity associated with the sigmoid colon which demonstrates mild wall thickening, surrounding inflammation and underlying diverticulosis, suggesting mild diverticulitis. Suspected treatment related changes throughout the bone  marrow. There is mildly increased activity within the clivus without clear corresponding finding on the CT images. Attention on follow-up recommended. Nonobstructing right renal calculi. 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   06/09/18 PET/CT revealedSoft tissue lesion within the ventral pelvis is again identified demonstrating mild to moderate increased uptake  within SUV max of 5.61. Deauville criteria 4. Concerning for residual metabolically active tumor. 2. Similar appearance of diffusely thickened appendix within the right lower quadrant of the abdomen. Findings may reflect treatment related changes. 3. Increased radiotracer uptake throughout the bone marrow which is favored to represent treatment related changes. 4. Lad coronary artery atherosclerotic calcifications.   S/p 6 cycles of EPOCH-R completed on 07/03/18  10/05/18 CT A/P revealed Persistent wall thickening in the sigmoid colon although a component of this could be due to diverticulosis. There is also a suggestion of wall thickening and potential mucosal enhancement in the terminal ileum, as well as adjacent indistinct stranding in the mesenteric and omental adipose tissues roughly similar to the prior exam. Some of this may be treatment related. Inflammatory process such as Crohn's disease not excluded. 2. No current adenopathy. 3. Other imaging findings of potential clinical significance: Nonobstructive right nephrolithiasis. Aortic Atherosclerosis. Moderate prostatomegaly. Left foraminal impingement at L4-5.  S/p IMRT of 36Gy over 20 fractions, completed 12/06/18.  2.H/oRLE DVT and b/l PE.  Anticoagulation held at this time in the setting of thrombocytopenia and GI bleeding from severe radiation colitis. -May consider prophylactic Lovenox while hospitalized if platelets above 50k and no overt GI bleeding with stable hemoglobin. -SCD for DVT prophylaxis.  3. H/o Small bowel obstruction/ileus with sigmoid thickening/stenosis . CT  scan - sigmoid obstructive lesion ? Scar tissue from resolved lymphoma vs residual lymphoma vs post inflammatory scarring from diverticulitis/limited perforation. Also has SBO from adhesions. Colonoscopy 08/21/2018 - no intramucosal lesions. Extrinsic compression of sigmoid colon. 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 Greer Pickerel  4.  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  GI panel and C. difficile negative.  CMV IgM negative, EBV IgM negative. 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  5.  Dehydration with hypovolemic hyponatremia.  Other electrolyte issues including hypokalemia and hypomagnesemia. 6.  Pancytopenia Anemia likely multifactorial but could be from GI bleeding plus significant inflammation from severe radiation colitis. Leukopenia/lymphopenia likely from radiation toxicity.  Protein-losing enteropathy can also accentuate the lymphopenia and bone marrow suppression due to severe inflammatory state from his radiation colitis. Thrombocytopenia from consumption related to GI bleeding and from radiation toxicity. No overt evidence of DIC. Significantly elevated fibrinogen level as a marker of severe inflammation and acute phase reactant. B12 within normal limits Copper levels within normal limits at 132  zinc deficiency noted- 44 Viral work-up ordered and currently pending Low likelihood of Burkitt's lymphoma progression at this time. Plan  -Patient with gradually improving p.o. intake -Currently undergoing calorie counting to determine need for TPN. -Discussed with patient and wife and added low-dose dexamethasone 4 mg p.o. daily to optimize his p.o. intake and possibly help with bowel inflammation. -Also added low-dose Marinol to help p.o. intake and help with some element of chronic nausea. -If nutritional optimization is not proceeding orally will strongly need to consider TPN in the setting of  multiple nutritional deficiencies trace element deficiencies and affected bowel absorptive function and for consideration of bowel rest. -Hope is neutropenia resolved so TPN can be considered more safely and enema-based treatments would be safely possible. -Continue Granix  480 mcg daily until [place] is more than 1500 for 2 successive days.  -IV fluids are currently on hold -Patient was strongly encouraged in the presence of his wife on the phone to increase ambulation and p.o. intake as much as possible. -Anticipate his blood counts will likely improve as  his inflammatory markers drop with resolution of his acute radiation colitis. -Per radiation oncology given the dose of radiation lower risk of chronic radiation colitis. -Patient is aware that acute radiation colitis can last for anywhere from a few to multiple weeks. -GI following for management of radiation pancolitis.  Hope resolution of neutropenia soon can expand options for enema-based treatments. -track sed rate and CRP as a function of resoliving bowel inflammation. - oral replacement for zinc deficiency. -Empiric B complex. -Transfuse PRBC PRN for hemoglobin of less than 7.5 or if actively bleeding or if symptomatic.  Has not needed transfusions for 2 days which is a good sign.  Platelets also appear to be stabilizing. -Transfuse platelets as needed for platelet count of less than 20,000 in the setting of GI ulceration or if actively bleeding and less than 50,000. -We will continue to follow -Updated his wife [name] on the phone regarding his current condition.  . The total time spent in the appointment was 35 minutes and more than 50% was on counseling and direct patient cares.     Sullivan Lone MD [place] AAHIVMS [facility] Hematology/Oncology Physician [facility]  (Office):       [phone] (Work cell):  [phone] (Fax):           [phone]

## 2019-01-12 NOTE — Telephone Encounter (Signed)
Called his wife and updated her regarding his condition. He is doing better with improvement of Pancytopenia/neutropenia, being managed by Hematology. Radiation vs neutropenic enterocolitis improving. Ileostomy output decreased to baseline 900cc/ 24 hour on Cholestyramine. Discharge planning per Hospitalist team

## 2019-01-12 NOTE — Progress Notes (Signed)
Calorie Count Note  48 hour calorie count ordered. Day 1 results below.  Diet: CHO modified Supplements:  -Ensure Enlive po TID, each supplement provides 350 kcal and 20 grams of protein -Prostat liquid protein PO 30 ml BID with meals, each supplement provides 100 kcal, 15 grams protein.  4/30-5/1 Breakfast (5/1): [employer],  730 kcal, 12g protein Lunch(5/1) : [employer], 940 kcal, 35g protein Dinner(4/29): 50%, 240 ml -not documented what this was exactly Supplements: 450 kcal, 35g protein  Estimated Nutritional Needs:  Kcal:  2400-2600 kcal Protein:  120-130 grams  Total intake: >2120 kcal (>88% of minimum estimated needs)  >82g protein (>68% of minimum estimated needs)  Nutrition Dx: Moderate Malnutrition related to chronic illness, catabolic illness, cancer and cancer related treatments as evidenced by mild fat depletion, mild muscle depletion, moderate muscle depletion.   Intervention:  -Continue Calorie Count  -Continue Ensure Enlive po TID, each supplement provides 350 kcal and 20 grams of protein -Continue Prostat liquid protein PO 30 ml BID with meals, each supplement provides 100 kcal, 15 grams protein.  Clayton Bibles, MS, RD, [place] Dietitian Pager: [phone] After Hours Pager: [phone]

## 2019-01-12 NOTE — TOC Initial Note (Signed)
Transition of Care [facility]) - Initial/Assessment Note    Patient Details  Name: Mark Frey MRN: [id number] Date of Birth: June 10, 1956  Transition of [facility]) CM/SW Contact:    Purcell Mouton, RN Phone Number: 01/12/2019, 2:15 PM  Clinical Narrative:  Pt with admitted with Neutropenic fever.  Pt states he will not need HH at present time.               Expected Discharge Plan: Home/Self Care Barriers to Discharge: No Barriers Identified   Patient Goals and CMS Choice     Choice offered to / list presented to : Spouse  Expected Discharge Plan and Services Expected Discharge Plan: Home/Self Care   Discharge Planning Services: CM Consult Post Acute Care Choice: [place] arrangements for the past 2 months: Single Family Home Expected Discharge Date: (unknown)                         HH Arranged: Refused HH          Prior Living Arrangements/Services Living arrangements for the past 2 months: Single Family Home Lives with:: Relatives Patient language and need for interpreter reviewed:: No Do you feel safe going back to the place where you live?: Yes          Current home services: Home PT    Activities of Daily Living Home Assistive Devices/Equipment: CBG Meter, Walker (specify type), Eyeglasses, Cane (specify quad or straight), Ostomy supplies(single point cane, 4-wheeled walker, colostomy) ADL Screening (condition at time of admission) Patient's cognitive ability adequate to safely complete daily activities?: Yes Is the patient deaf or have difficulty hearing?: No Does the patient have difficulty seeing, even when wearing glasses/contacts?: No Does the patient have difficulty concentrating, remembering, or making decisions?: Yes(has been a little confused lately) Patient able to express need for assistance with ADLs?: Yes Does the patient have difficulty dressing or bathing?: Yes Independently performs ADLs?: No Communication:  Independent Dressing (OT): Needs assistance Is this a change from baseline?: Change from baseline, expected to last >3 days Grooming: Needs assistance Is this a change from baseline?: Change from baseline, expected to last >3 days Feeding: Needs assistance Is this a change from baseline?: Change from baseline, expected to last >3 days Bathing: Needs assistance Is this a change from baseline?: Change from baseline, expected to last >3 days Toileting: Dependent Is this a change from baseline?: Change from baseline, expected to last >3days In/Out Bed: Dependent Is this a change from baseline?: Change from baseline, expected to last >3 days Walks in Home: Dependent Is this a change from baseline?: Change from baseline, expected to last >3 days Does the patient have difficulty walking or climbing stairs?: Yes Weakness of Legs: Both Weakness of Arms/Hands: Both  Permission Sought/Granted                  Emotional Assessment Appearance:: Appears stated age   Affect (typically observed): Accepting, Calm Orientation: : Oriented to Self, Oriented to Place, Oriented to  Time, Oriented to Situation      Admission diagnosis:  Dehydration [E86.0] Hyponatremia [E87.1] Patient Active Problem List   Diagnosis Date Noted  . Hypomagnesemia 01/11/2019  . Neutropenia ([place]) 01/11/2019  . Other neutropenia ([place])   . Pressure injury of skin 01/07/2019  . Zinc deficiency   . Malnutrition of moderate degree 01/04/2019  . Radiation gastroenteritis   . Radiation colitis   . Hyponatremia 01/01/2019  . Pancytopenia ([place]) 01/01/2019  .  Hematochezia   . Abnormal CT of the abdomen   . Severe protein-calorie malnutrition ([place]) 08/19/2018  . 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 Diverticulitis of colon   . Low magnesium level 07/30/2018  . Hypokalemia 07/21/2018  . Bowel obstruction in setting of lymphoma and sigmoid stricture 07/21/2018  . Diarrhea 07/21/2018  . Burkitt lymphoma,  lymph nodes of multiple sites ([place]) 05/22/2018  . Diabetes mellitus ([place])   . Edema   . Deep vein thrombosis (DVT) of femoral vein of right lower extremity ([place])   . Counseling regarding advance care planning and goals of care 04/25/2018  . Gastrointestinal hemorrhage   . Encounter for antineoplastic chemotherapy   . 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[place]) 04/10/2018  . Port-A-Cath in place 03/27/2018  . Acute deep vein thrombosis (DVT) of femoral vein of right lower extremity ([place])   . 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[place]) 03/15/2018  . Bilateral pulmonary embolism ([place]) 03/10/2018  . Anemia 03/10/2018  . Hypoglycemia 03/10/2018  . Occult blood in stools 03/10/2018  . Abdominal mass 03/10/2018  . Tachycardia 01/31/2017  . Controlled diabetes mellitus type II without complication ([place]) 80/88/1103  . Hypertension 06/05/2014  . Irritable bowel syndrome 03/30/2010  . Morbid obesity ([place]) 12/29/2009  . Dysmetabolic syndrome X 15/94/5859  . BACK PAIN WITH RADICULOPATHY 06/09/2007  . Hyperlipidemia 05/12/2007  . ALLERGIC RHINITIS 05/12/2007   PCP:  [name], MD Pharmacy:   [id number] [place] [id number]: [phone]: [phone] [facility] [address] Reg[facility] [place] [id number]: [phone]: [phone]    Social Determinants of Health (SDOH) Interventions    Readmission Risk Interventions No flowsheet data found.

## 2019-01-12 NOTE — Progress Notes (Signed)
[facility] PROGRESS NOTE  Mark Frey DZH:[id number] DOB: June 29, 1956 DOA: 01/01/2019 PCP: [name], MD  Assessment/Plan:  Neutropenic fever in patient with b cell lymphoma and radiation colitis, improved. No fever since 4/25. Blood cultures with no growth.  Has completed cefepime and Flagyl therapy.  Oncology recently increased his Granix dosing to improve neutropenia on 4/27, which is now improving will discuss with oncology if any plans for further up titration.  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 ESR and CRP have both down trended, infectious workup with C diff, GIP panel, CMV, EBV all negative. GI following no plans for interventions, stopped fatty acid enema due to neutropenia. Continue cholestyramine added on 4/28 for high ostomy output and reassess. Hopeful patient will be able to improve oral intake otherwise may need to consider TPN.  Has completed Flagyl therapy.  Diminished nutrition. Supplementations provided by nutrition. Eating at least 50% of meals.  Currently undergoing calorie count for objective data to determine need for potential TPN.  High grade B-cell lymphoma. Oncology following, undergoing outpatient radiation therapy. 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  No longer bleeding.  Hemoglobin and platelets remains stable. WBC improving Received 1 U PRBC so far this hospital stay  HX of DVT/Pe. Holding eliquis while thrombocytopenic and GI bleeding on admission from severe radiation colitis.  Continue SCDs for DVT prophylaxis  Hypovolemic hyponatremia, stable improved, previous nadir of 120, currently ranging 131-134.  Monitor BMP.  Will discontinue IV fluids for now and closely monitor  Hypokalemia and hypomagnesemia, improving supplement  orally and intravenously respectively, check mag and K as needed  Zinc deficiency.  Receiving zinc supplementation daily  Code Status: Full code Family Communication: No family at bedside (indicate person spoken with, relationship, and if by phone, the number) Disposition Plan: Improvement in neutropenia and sustained improvement, stability of hemoglobin/platelets, stable/improved nutrition, currently undergoing calorie count in case TPN is needed. Discussed with [name] on 5/1 his improvement, hopeful for discharge in mid next week if nutrition status and neutropenia is optimized   Consultants:  GI, oncology  Procedures:  None  Antibiotics: Cefepime 4/23-4/29 Flagyl 4/27- Vancomycin 4/23- HPI/Subjective:  Mark Frey is a 63 y.o. 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, DVT/PE on Eliquis, diabetes type 2  who presented on 01/01/2019 with diarrhea for 2 to 3 weeks and severe weakness and was found to have radiation-induced severe pancolitis with electrolyte derangements with course complicated by neutropenic fever  Eating lunch No abdominal pain No rectal bleeding Notes improvement in appetite   Objective: Vitals:   01/12/19 0306 01/12/19 0535  BP:  122/76  Pulse: 76 90  Resp:  16  Temp:  97.7 F (36.5 C)  SpO2:  97%    Intake/Output Summary (Last 24 hours) at 01/12/2019 1202 Last data filed at 01/12/2019 0900 Gross per 24 hour  Intake 1625 ml  Output 2410 ml  Net -785 ml   Filed Weights   01/01/19 1052 01/02/19 2050  Weight: 90.7 kg 86.3 kg    Exam:   General: Obese male lying in bed, comfortably no distress  Cardiovascular: Regular rate and rhythm, no appreciable murmurs rubs or gallops, no peripheral edema  Respiratory:  Normal respiratory effort on room air  Abdomen: Soft, nondistended,  nontender, diminished bowel sounds, liquid stool in ostomy bag  Musculoskeletal: Normal range of motion  Skin no rashes or lesions  Neurologic no appreciable focal deficits  Data Reviewed: Basic Metabolic Panel: Recent Labs  Lab 01/05/19 1400  01/07/19 0811 01/08/19 0212 01/09/19 0353 01/10/19 0538 01/11/19 0353 01/11/19 1500 01/12/19 0258  NA  --    < >  --  131* 131* 132* 134*  --  134*  K  --    < >  --  3.3* 3.2* 3.4* 3.9  --  4.2  CL  --    < >  --  103 103 103 106  --  105  CO2  --    < >  --  20* 22 22 22  --  22  GLUCOSE  --    < >  --  120* 108* 113* 117*  --  171*  BUN  --    < >  --  8 8 7* 7*  --  9  CREATININE  --    < >  --  0.49* 0.37* 0.49* 0.39*  --  0.35*  CALCIUM  --    < >  --  7.1* 7.2* 7.4* 7.3*  --  7.7*  MG  --   --  1.4* 1.7 1.4* 1.6*  --  1.6*  --   PHOS 2.0*  --   --   --   --   --   --   --   --    < > = values in this interval not displayed.   Liver Function Tests: Recent Labs  Lab 01/09/19 1145 01/11/19 0353 01/12/19 0258  AST 17 17 11*  ALT 40 28 23  ALKPHOS 142* 170* 158*  BILITOT 1.1 0.6 0.9  PROT 4.6* 4.2* 4.5*  ALBUMIN 1.6* 1.5* 1.8*   No results for input(s): LIPASE, AMYLASE in the last 168 hours. No results for input(s): AMMONIA in the last 168 hours. CBC: Recent Labs  Lab 01/08/19 0212 01/08/19 1326 01/09/19 0353 01/10/19 0538 01/11/19 0353 01/12/19 0258  WBC 1.3*  --  1.2* 1.3* 1.3* 2.2*  NEUTROABS 1.0*  --  0.9* 0.9* 0.8* 1.7  HGB 6.7*  --  6.9* 7.8* 7.8* 8.1*  HCT 20.7*  --  21.2* 23.3* 25.3* 26.4*  MCV 95.8  --  93.8 92.8 95.8 97.4  PLT 37* 37* 34* 36* 41* 44*   Cardiac Enzymes: No results for input(s): CKTOTAL, CKMB, CKMBINDEX, TROPONINI in the last 168 hours. BNP (last 3 results) No results for input(s): BNP in the last 8760 hours.  ProBNP (last 3 results) No results for input(s): PROBNP in the last 8760 hours.  CBG: Recent Labs  Lab 01/11/19 1152 01/11/19 1629 01/11/19 1954 01/12/19 0805  01/12/19 1124  GLUCAP 173* 100* 198* 160* 251*    Recent Results (from the past 240 hour(s))  Culture, blood (routine x 2)     Status: None   Collection Time: 01/04/19  9:00 AM  Result Value Ref Range Status   Specimen Description   Final    BLOOD PORTA CATH Performed at [facility], [address] [place], [place] [address]    Special Requests   Final    BOTTLES DRAWN AEROBIC AND ANAEROBIC Blood Culture adequate volume Performed at  [facility], [address] [place], [place] [address]    Culture   Final      NO GROWTH 5 DAYS Performed at [facility], [place] [address] [place], [place] [address]    Report Status 01/09/2019 FINAL  Final  Culture, blood (routine x 2)     Status: None   Collection Time: 01/04/19 10:09 AM  Result Value Ref Range Status   Specimen Description   Final    BLOOD RIGHT HAND Performed at [facility] [address] [place], [place] [address]    Special Requests   Final    BOTTLES DRAWN AEROBIC AND ANAEROBIC Blood Culture adequate volume Performed at [facility] [address] [place], [place] [address]    Culture   Final    NO GROWTH 5 DAYS Performed at [facility], [place] [address], [place], [place] [address]    Report Status 01/09/2019 FINAL  Final     Studies: No results found.  Scheduled Meds: . B-complex with vitamin C  1 tablet Oral Daily  . cholestyramine light  4 g Oral 3 times per day  . dexamethasone  4 mg Oral Q breakfast  . dronabinol  2.5 mg Oral BID AC  . feeding supplement (ENSURE ENLIVE)  237 mL Oral TID BM  . feeding supplement (PRO-STAT SUGAR FREE 64)  30 mL Oral BID  . insulin aspart  0-5 Units Subcutaneous QHS  . insulin aspart  0-9 Units Subcutaneous TID WC  . Tbo-filgastrim (GRANIX) SQ  480 mcg Subcutaneous q1800  . zinc sulfate  220 mg Oral BID   Continuous Infusions:   Principal Problem:   Hyponatremia Active  Problems:   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HCC)   Hypokalemia   Diarrhea   Severe protein-calorie malnutrition (HCC)   Pancytopenia (HCC)   Hematochezia   Radiation gastroenteritis   Radiation colitis   Malnutrition of moderate degree   Zinc deficiency   Pressure injury of skin   Other neutropenia (HCC)   Hypomagnesemia   Neutropenia ([place])      Myrlene Broker D Kyrielle Urbanski  [facility]

## 2019-01-12 NOTE — Telephone Encounter (Signed)
Pt's wife called and stated that pt has been admitted to [facility] all week and being treated by Dr. Silverio Decamp.  Wife requested a call for an update on pt's condition since she cannot visit.

## 2019-01-12 NOTE — Progress Notes (Signed)
Pt declined [place] at present time.

## 2019-01-12 NOTE — Telephone Encounter (Signed)
Dr. Silverio Decamp please call when you have the opportunity.

## 2019-01-12 NOTE — Progress Notes (Signed)
PHYSICAL THERAPY  Pt has amb with nursing 3 times today.  Doing well.  Plans to D/C back home.  Rica Koyanagi  PTA Acute  Rehabilitation Services Pager      [phone] Office      [phone]

## 2019-01-13 LAB — CBC WITH DIFFERENTIAL/PLATELET
Abs Immature Granulocytes: 0.05 10*3/uL (ref 0.00–0.07)
Basophils Absolute: 0.1 10*3/uL (ref 0.0–0.1)
Basophils Relative: 3 %
Eosinophils Absolute: 0 10*3/uL (ref 0.0–0.5)
Eosinophils Relative: 0 %
HCT: 26.5 % — ABNORMAL LOW (ref 39.0–52.0)
Hemoglobin: 8.4 g/dL — ABNORMAL LOW (ref 13.0–17.0)
Immature Granulocytes: 2 %
Lymphocytes Relative: 10 %
Lymphs Abs: 0.3 10*3/uL — ABNORMAL LOW (ref 0.7–4.0)
MCH: 31 pg (ref 26.0–34.0)
MCHC: 31.7 g/dL (ref 30.0–36.0)
MCV: 97.8 fL (ref 80.0–100.0)
Monocytes Absolute: 0.4 10*3/uL (ref 0.1–1.0)
Monocytes Relative: 14 %
Neutro Abs: 2 10*3/uL (ref 1.7–7.7)
Neutrophils Relative %: 71 %
Platelets: 51 10*3/uL — ABNORMAL LOW (ref 150–400)
RBC: 2.71 MIL/uL — ABNORMAL LOW (ref 4.22–5.81)
RDW: 18.3 % — ABNORMAL HIGH (ref 11.5–15.5)
WBC: 2.8 10*3/uL — ABNORMAL LOW (ref 4.0–10.5)
nRBC: 0 % (ref 0.0–0.2)

## 2019-01-13 LAB — COMPREHENSIVE METABOLIC PANEL
ALT: 26 U/L (ref 0–44)
AST: 18 U/L (ref 15–41)
Albumin: 1.9 g/dL — ABNORMAL LOW (ref 3.5–5.0)
Alkaline Phosphatase: 162 U/L — ABNORMAL HIGH (ref 38–126)
Anion gap: 6 (ref 5–15)
BUN: 11 mg/dL (ref 8–23)
CO2: 25 mmol/L (ref 22–32)
Calcium: 7.8 mg/dL — ABNORMAL LOW (ref 8.9–10.3)
Chloride: 105 mmol/L (ref 98–111)
Creatinine, Ser: 0.37 mg/dL — ABNORMAL LOW (ref 0.61–1.24)
GFR calc Af Amer: >60 mL/min (ref >60)
GFR calc non Af Amer: >60 mL/min (ref >60)
Glucose, Bld: 116 mg/dL — ABNORMAL HIGH (ref 70–99)
Potassium: 3.8 mmol/L (ref 3.5–5.1)
Sodium: 136 mmol/L (ref 135–145)
Total Bilirubin: 0.7 mg/dL (ref 0.3–1.2)
Total Protein: 4.8 g/dL — ABNORMAL LOW (ref 6.5–8.1)

## 2019-01-13 LAB — GLUCOSE, CAPILLARY
Glucose-Capillary: 101 mg/dL — ABNORMAL HIGH (ref 70–99)
Glucose-Capillary: 169 mg/dL — ABNORMAL HIGH (ref 70–99)
Glucose-Capillary: 128 mg/dL — ABNORMAL HIGH (ref 70–99)
Glucose-Capillary: 248 mg/dL — ABNORMAL HIGH (ref 70–99)

## 2019-01-13 LAB — C-REACTIVE PROTEIN: CRP: 6.6 mg/dL — ABNORMAL HIGH (ref <1.0)

## 2019-01-13 LAB — SEDIMENTATION RATE: Sed Rate: 99 mm/hr — ABNORMAL HIGH (ref 0–16)

## 2019-01-13 MED ORDER — DRONABINOL 2.5 MG PO CAPS
5.0000 mg | ORAL_CAPSULE | Freq: Two times a day (BID) | ORAL | Status: DC
  Administered 2019-01-13 – 2019-01-16 (×6): 5 mg via ORAL
  Filled 2019-01-13 (×6): qty 2

## 2019-01-13 NOTE — Progress Notes (Addendum)
HEMATOLOGY/ONCOLOGY INPATIENT PROGRESS NOTE  Date of Service: 01/12/2019  Inpatient Attending: .Desiree Hane, MD   SUBJECTIVE:   Mark Frey was seen in follow-up.  I visited him and had his wife [name] on the phone.  No fevers.  Off antibiotics.  Improved po intake on dexamethasone and marinol. Appears brighter and more energetic. Ambulating more today. Neutropenia resolved. Improving hgb and platelets Ileostomy output decreasing.  OBJECTIVE:  Resting comfortably in bed  PHYSICAL EXAMINATION: .VS reviewed  Filed Weights   01/01/19 1052 01/02/19 2050  Weight: 200 lb (90.7 kg) 190 lb 4.1 oz (86.3 kg)   .Body mass index is 28.1 kg/m.  GENERAL: more awake and alert today. OROPHARYNX: Oral mucosa moist LUNGS: clear to auscultation HEART: regular rate & rhythm ABDOMEN: Tenderness to deep palpation over the left part of the abdomen especially left lower quadrant Musculoskeletal: 1+ pedal edema PSYCH: alert & oriented x 3 . Appears brighter and more conversant. NEURO: no focal motor/sensory deficits  MEDICAL HISTORY:  Past Medical History:  Diagnosis Date   ALLERGIC RHINITIS    Cancer ([place])    Lymphoma    Diabetes mellitus    Hyperlipidemia     SURGICAL HISTORY: Past Surgical History:  Procedure Laterality Date   BIOPSY  03/15/2018   Procedure: BIOPSY;  Surgeon: Milus Banister, MD;  Location: [facility];  Service: Endoscopy;;   BOWEL RESECTION N/A 08/25/2018   Procedure: LAPROSCOPIC LOOP ILEOSTOMY;  Surgeon: Greer Pickerel, MD;  Location: [name];  Service: General;  Laterality: N/A;   CLEFT PALATE REPAIR     COLONOSCOPY N/A 03/15/2018   Procedure: COLONOSCOPY;  Surgeon: Milus Banister, MD;  Location: [name];  Service: Endoscopy;  Laterality: N/A;   COLONOSCOPY N/A 08/20/2018   Procedure: COLONOSCOPY;  Surgeon: Irene Shipper, MD;  Location: [facility];  Service: Endoscopy;  Laterality: N/A;   deviated septum repair     slight improvement    ESOPHAGOGASTRODUODENOSCOPY N/A 03/15/2018   Procedure: ESOPHAGOGASTRODUODENOSCOPY (EGD);  Surgeon: Milus Banister, MD;  Location: [name];  Service: Endoscopy;  Laterality: N/A;   IR IMAGING GUIDED PORT INSERTION  03/17/2018   LAPAROSCOPY N/A 08/25/2018   Procedure: LAPAROSCOPY DIAGNOSTIC;  Surgeon: Greer Pickerel, MD;  Location: [facility];  Service: General;  Laterality: N/A;   TONSILLECTOMY      SOCIAL HISTORY: Social History   Socioeconomic History   Marital status: Married    Spouse name: lisa   Number of children: 0   Years of education: Not on file   Highest education level: Not on file  Occupational History   Not on file  Social Needs   Financial resource strain: Not hard at all   Food insecurity:    Worry: Never true    Inability: Never true   Transportation needs:    Medical: No    Non-medical: No  Tobacco Use   Smoking status: Never Smoker   Smokeless tobacco: Never Used  Substance and Sexual Activity   Alcohol use: Yes    Comment: occasional   Drug use: No   Sexual activity: Yes  Lifestyle   Physical activity:    Days per week: 0 days    Minutes per session: 0 min   Stress: Not at all  Relationships   Social connections:    Talks on phone: More than three times a week    Gets together: More than three times a week    Attends religious service: 1 to 4 times per  year    Active member of club or organization: No    Attends meetings of clubs or organizations: Never    Relationship status: Married   Intimate partner violence:    Fear of current or ex partner: No    Emotionally abused: No    Physically abused: No    Forced sexual activity: No  Other Topics Concern   Not on file  Social History Narrative   Married 1985. No kids. 4 small dogs.       Works in Financial trader, residential      Hobbies: work on cars, Haematologist, exercise as able    FAMILY HISTORY: Family History  Problem Relation Age of Onset   Lung cancer  Mother        smoker   Brain cancer Mother        metastasis   AAA (abdominal aortic aneurysm) Father        smoker    ALLERGIES:  is allergic to ciprofloxacin.  MEDICATIONS:  Scheduled Meds:  B-complex with vitamin C  1 tablet Oral Daily   cholestyramine light  4 g Oral 3 times per day   dexamethasone  4 mg Oral Q breakfast   dronabinol  5 mg Oral BID AC   feeding supplement (ENSURE ENLIVE)  237 mL Oral TID BM   feeding supplement (PRO-STAT SUGAR FREE 64)  30 mL Oral BID   insulin aspart  0-5 Units Subcutaneous QHS   insulin aspart  0-9 Units Subcutaneous TID WC   zinc sulfate  220 mg Oral BID   Continuous Infusions:  PRN Meds:.acetaminophen **OR** acetaminophen, ondansetron **OR** ondansetron (ZOFRAN) IV, oxyCODONE, sodium chloride flush  REVIEW OF SYSTEMS:    10 Point review of Systems was done is negative except as noted above.   LABORATORY DATA:  I have reviewed the data as listed  . Component     Latest Ref Rng & Units 01/11/2019 01/12/2019  WBC     4.0 - 10.5 K/uL 1.3 (LL) 2.2 (L)  RBC     4.22 - 5.81 MIL/uL 2.64 (L) 2.71 (L)  Hemoglobin     13.0 - 17.0 g/dL 7.8 (L) 8.1 (L)  HCT     39.0 - 52.0 % 25.3 (L) 26.4 (L)  MCV     80.0 - 100.0 fL 95.8 97.4  MCH     26.0 - 34.0 pg 29.5 29.9  MCHC     30.0 - 36.0 g/dL 30.8 30.7  RDW     11.5 - 15.5 % 18.5 (H) 18.3 (H)  Platelets     150 - 400 K/uL 41 (L) 44 (L)  nRBC     0.0 - 0.2 % 0.0 0.0  Neutrophils     % 62 74  NEUT#     1.7 - 7.7 K/uL 0.8 (L) 1.7  Lymphocytes     % 12 6  Lymphocyte #     0.7 - 4.0 K/uL 0.2 (L) 0.1 (L)  Monocytes Relative     % 16 14  Monocyte #     0.1 - 1.0 K/uL 0.2 0.3  Eosinophil     % 1 0  Eosinophils Absolute     0.0 - 0.5 K/uL 0.0 0.0  Basophil     % 2 2  Basophils Absolute     0.0 - 0.1 K/uL 0.0 0.0  Immature Granulocytes     % 7 4  Abs Immature Granulocytes     0.00 - 0.07 K/uL  0.09 (H) 0.09 (H)  Dohle Bodies      PRESENT   Ovalocytes      PRESENT  PRESENT  Sodium     135 - 145 mmol/L 134 (L) 134 (L)  Potassium     3.5 - 5.1 mmol/L 3.9 4.2  Chloride     98 - 111 mmol/L 106 105  CO2     22 - 32 mmol/L 22 22  Glucose     70 - 99 mg/dL 117 (H) 171 (H)  BUN     8 - 23 mg/dL 7 (L) 9  Creatinine     0.61 - 1.24 mg/dL 0.39 (L) 0.35 (L)  Calcium     8.9 - 10.3 mg/dL 7.3 (L) 7.7 (L)  Total Protein     6.5 - 8.1 g/dL 4.2 (L) 4.5 (L)  Albumin     3.5 - 5.0 g/dL 1.5 (L) 1.8 (L)  AST     15 - 41 U/L 17 11 (L)  ALT     0 - 44 U/L 28 23  Alkaline Phosphatase     38 - 126 U/L 170 (H) 158 (H)  Total Bilirubin     0.3 - 1.2 mg/dL 0.6 0.9  GFR, Est Non African American     >60 mL/min >60 >60  GFR, Est African American     >60 mL/min >60 >60  Anion gap     5 - 15 6 7      RADIOGRAPHIC STUDIES: I have personally reviewed the radiological images as listed and agreed with the findings in the report. Dg Chest 2 View  Result Date: 01/01/2019 CLINICAL DATA:  Short of breath.  Recent chemotherapy for lymphoma EXAM: CHEST - 2 VIEW COMPARISON:  08/24/2018 FINDINGS: Heart size and vascularity normal. Mild linear densities in the right lung base are new and may be atelectasis. No pleural effusion. Port-A-Cath tip SVC. IMPRESSION: Mild right lower lobe atelectasis.  Negative for pneumonia. Electronically Signed   By: Franchot Gallo M.D.   On: 01/01/2019 19:38   Ct Abdomen Pelvis W Contrast  Result Date: 01/01/2019 CLINICAL DATA:  Diarrhea and weakness. History of Burkitt's lymphoma. Recently completed radiation. Ileostomy EXAM: CT ABDOMEN AND PELVIS WITH CONTRAST TECHNIQUE: Multidetector CT imaging of the abdomen and pelvis was performed using the standard protocol following bolus administration of intravenous contrast. CONTRAST:  145m OMNIPAQUE IOHEXOL 300 MG/ML  SOLN COMPARISON:  CT abdomen pelvis 10/05/2018 FINDINGS: Lower chest: Mild scarring in the lung bases. Negative for infiltrate or effusion. Hepatobiliary: Small subcentimeter  hypodensities in the liver are unchanged and likely benign cysts. Hypodensity adjacent to the interhemispheric fissure unchanged compatible with fatty infiltration of the liver. Gallbladder contracted and mildly thickened. No biliary dilatation. Pancreas: Negative Spleen: Negative Adrenals/Urinary Tract: 5 mm right lower pole calculus and 2 mm right midpole calculus. No renal obstruction or mass. Negative bladder. Stomach/Bowel: Ileostomy. Marked thickening of the entire colon from cecum to rectum with extensive stranding in the pericolonic fat. No free air or free fluid. Small bowel nondilated and non thickened. Vascular/Lymphatic: Minimal atherosclerotic disease in the aorta. Negative for lymphadenopathy. Reproductive: Moderate prostate enlargement with numerous calcifications. Other: Negative for abscess Musculoskeletal: Lumbar degenerative changes without acute skeletal abnormality. IMPRESSION: Findings compatible with severe colitis. Diffuse thickening of the colon with surrounding edema in the pericolonic fat. No abscess or free fluid. No small bowel obstruction. Ileostomy noted in the right lower quadrant. Negative for mass or adenopathy. Electronically Signed   By: CFranchot Gallo  M.D.   On: 01/01/2019 19:37    ASSESSMENT & PLAN:   64 y.o. male with  1. 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 No BM or CNS involvement.   04/18/18 PET/CT revealed Continued improvement in  right colonic wall thickening and surrounding bulky masses consistent with treated lymphoma. No hypermetabolic activity to suggest residual tumor. There is some hypermetabolic activity associated with the sigmoid colon which demonstrates mild wall thickening, surrounding inflammation and underlying diverticulosis, suggesting mild diverticulitis. Suspected treatment related changes throughout the bone marrow. There is mildly increased activity within the clivus without clear corresponding finding on the CT images. Attention on follow-up recommended. Nonobstructing right renal calculi. 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   06/09/18 PET/CT revealedSoft tissue lesion within the ventral pelvis is again identified demonstrating mild to moderate increased uptake within SUV max of 5.61. Deauville criteria 4. Concerning for residual metabolically active tumor. 2. Similar appearance of diffusely thickened appendix within the right lower quadrant of the abdomen. Findings may reflect treatment related changes. 3. Increased radiotracer uptake throughout the bone marrow which is favored to represent treatment related changes. 4. Lad coronary artery atherosclerotic calcifications.   S/p 6 cycles of EPOCH-R completed on 07/03/18  10/05/18 CT A/P revealed Persistent wall thickening in the sigmoid colon although a component of this could be due to diverticulosis. There is also a suggestion of wall thickening and potential mucosal enhancement in the terminal ileum, as well as adjacent indistinct stranding in the mesenteric and omental adipose tissues roughly similar to the prior exam. Some of this may be treatment related. Inflammatory process such as Crohn's disease not excluded. 2. No current adenopathy. 3. Other imaging findings of potential clinical significance: Nonobstructive right nephrolithiasis. Aortic Atherosclerosis. Moderate prostatomegaly. Left foraminal impingement at L4-5.  S/p IMRT of 36Gy over 20 fractions,  completed 12/06/18.  2.H/oRLE DVT and b/l PE.  Anticoagulation held at this time in the setting of thrombocytopenia and GI bleeding from severe radiation colitis. -May consider prophylactic Lovenox while hospitalized if platelets above 50k and no overt GI bleeding with stable hemoglobin. -SCD for DVT prophylaxis.  3. H/o Small bowel obstruction/ileus with sigmoid thickening/stenosis . CT scan - sigmoid obstructive lesion ? Scar tissue from resolved lymphoma vs residual lymphoma vs post inflammatory scarring from diverticulitis/limited perforation. Also has SBO from adhesions. Colonoscopy 08/21/2018 - no intramucosal lesions. Extrinsic compression of sigmoid colon. 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 Greer Pickerel  4.  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  GI panel and C. difficile negative.  CMV IgM negative, EBV IgM negative. 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  5. resolved Dehydration with hypovolemic hyponatremia.  Other electrolyte issues including hypokalemia and hypomagnesemia -stabilizing. 6.  Pancytopenia Anemia likely multifactorial but could be from GI bleeding plus significant inflammation from severe radiation colitis. Leukopenia/lymphopenia likely from radiation toxicity.  Protein-losing enteropathy can also accentuate the lymphopenia and bone marrow suppression due to severe inflammatory state from his radiation colitis. Thrombocytopenia from consumption related to GI  bleeding and from radiation toxicity. No overt evidence of DIC. Significantly elevated fibrinogen level as a marker of severe inflammation and acute phase reactant. B12 within normal limits Copper levels within normal limits at 132  zinc deficiency noted- 44 Viral work-up ordered and currently pending Low likelihood of Burkitt's lymphoma progression at this time. Plan  -Patient shows significantly improving po  intake. -Currently undergoing calorie counting to determine need for TPN. Given improvement in po intake appears patient might be able to hold off on TPN needs. Albumin is starting to improve. -remains off IVF and stable sodium. -Neutropenia resolved. If ANC>1500 tomorrow will discontinue granix. -continue low-dose dexamethasone 4 mg p.o. daily to optimize his p.o. intake and possibly help with bowel inflammation. -will increase Marinol to 5m po BID to help p.o. intake and help with some element of chronic nausea. -Patient was strongly encouraged in the presence of his wife on the phone to increase ambulation and p.o. intake as much as possible. -blood counts appear to be turning the corner. -GI following for management of radiation pancolitis. neutropenia now resolved - opens options for enemas if indicated -track sed rate and CRP as a function of resoliving bowel inflammation. - oral replacement for zinc deficiency. -Empiric B complex. -Transfuse PRBC PRN for hemoglobin of less than 7.5 or if actively bleeding or if symptomatic.  Has not needed transfusions for 2 days which is a good sign.  Platelets also appear to be stabilizing. -Transfuse platelets as needed for platelet count of less than 20,000 in the setting of GI ulceration or if actively bleeding and less than 50,000. -We will continue to follow -Updated his wife [name] the phone regarding his current condition. -discussed with hospitalist -Anticipate discharge early to mid next week with Home care services and Home PT/OT  . The total time spent in the appointment was 25 minutes and more than 50% was on counseling and direct patient cares.    GSullivan LoneMD MModocAAHIVMS SFrazier Rehab InstituteCSanford Aberdeen Medical CenterHematology/Oncology Physician CLittle [facility] (Office):       [phone](Work cell):  [phone](Fax):           [phone]

## 2019-01-13 NOTE — Progress Notes (Signed)
[facility] PROGRESS NOTE  Mark Frey JWJ:[id number] DOB: 1955-09-26 DOA: 01/01/2019 PCP: [name], MD  Assessment/Plan:  Neutropenic fever in patient with b cell lymphoma and radiation colitis, improved. No fever since 4/25. Blood cultures with no growth.  Has completed cefepime and Flagyl therapy.  Oncology recently increased his Granix dosing to improve neutropenia on 4/27, which is now improving, may be able to discontinue per oncology if [place] remains greater than 1500 on 5/3.  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 ESR and CRP have both down trended, infectious workup with C diff, GIP panel, CMV, EBV all negative. GI following no plans for interventions, stopped fatty acid enema due to neutropenia. Continue cholestyramine added on 4/28 for high ostomy output and reassess. Hopeful patient will be able to improve oral intake otherwise may need to consider TPN.  Has completed Flagyl therapy.Daily ESR/CRP to monitor bowel inflammation  Diminished nutrition, improving significantly. Supplementations provided by nutrition. Eating at least 50% of meals.  Currently undergoing calorie count for objective data to determine need for potential TPN.  Oncology increased Marinol to 5 mg p.o. twice daily to help with nausea and will continue low-dose dexamethasone to optimize his p.o. intake/improved bowel inflammation.  High grade B-cell lymphoma. Oncology following, undergoing outpatient radiation therapy. 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  No longer bleeding.  Hemoglobin and platelets remains stable. WBC improving Received 1 U PRBC so far this hospital stay.  Transfuse goal platelets greater than 20,000, (50,000 if bleeding), hemoglobin greater than 7.5  HX of  DVT/Pe. Holding eliquis while thrombocytopenic and GI bleeding on admission from severe radiation colitis.  Continue SCDs for DVT prophylaxis  Hypovolemic hyponatremia, stable improved, previous nadir of 120, currently ranging 131-134.  Monitor BMP.  Will discontinue IV fluids for now and closely monitor  Hypokalemia and hypomagnesemia, improving supplement orally and intravenously respectively, check mag and K as needed  Zinc deficiency.  Receiving zinc supplementation daily  Code Status: Full code Family Communication: No family at bedside (indicate person spoken with, relationship, and if by phone, the number) Disposition Plan: Improvement in neutropenia and sustained improvement, stability of hemoglobin/platelets, stable/improved nutrition, currently undergoing calorie count in case TPN is needed. Discussed with [name] on 5/1 his improvement, hopeful for discharge in mid next week if nutrition status and neutropenia is optimized   Consultants:  GI, oncology  Procedures:  None  Antibiotics: Cefepime 4/23-4/29 Flagyl 4/27- Vancomycin 4/23- HPI/Subjective:  Mark Frey is a 63 y.o. 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, DVT/PE on Eliquis, diabetes type 2  who presented on 01/01/2019 with diarrhea for 2 to 3 weeks and severe weakness and was found to have radiation-induced severe pancolitis with electrolyte derangements with course complicated by neutropenic fever  Ate breakfast well Denies any abdominal pain Reports of rectal bleeding Continued improvement in appetite  Objective: Vitals:   01/13/19 0619 01/13/19 1339  BP: 118/76 124/77  Pulse: (!) 104 (!) 124  Resp: 16 20  Temp: 98.9 F (37.2 C) 98.2 F (36.8 C)  SpO2: 96% 95%    Intake/Output Summary (Last 24 hours) at 01/13/2019 1356 Last data filed  at 01/13/2019 1100 Gross per 24 hour  Intake 460 ml  Output 650 ml  Net -190 ml   Filed Weights   01/01/19 1052 01/02/19 2050  Weight: 90.7 kg 86.3 kg    Exam:   General: Obese male lying in bed, comfortably no distress  Cardiovascular: Regular rate and rhythm, no appreciable murmurs rubs or gallops, no peripheral edema  Respiratory: Normal respiratory effort on room air  Abdomen: Soft, nondistended, nontender, diminished bowel sounds, liquid stool in ostomy bag  Musculoskeletal: Normal range of motion  Skin no rashes or lesions  Neurologic no appreciable focal deficits  Data Reviewed: Basic Metabolic Panel: Recent Labs  Lab 01/08/19 0212 01/09/19 0353 01/10/19 0538 01/11/19 0353 01/11/19 1500 01/12/19 0258 01/12/19 1714 01/13/19 0403  NA 131* 131* 132* 134*  --  134*  --  136  K 3.3* 3.2* 3.4* 3.9  --  4.2  --  3.8  CL 103 103 103 106  --  105  --  105  CO2 20* 22 22 22   --  22  --  25  GLUCOSE 120* 108* 113* 117*  --  171*  --  116*  BUN 8 8 7* 7*  --  9  --  11  CREATININE 0.49* 0.37* 0.49* 0.39*  --  0.35*  --  0.37*  CALCIUM 7.1* 7.2* 7.4* 7.3*  --  7.7*  --  7.8*  MG 1.7 1.4* 1.6*  --  1.6*  --  2.0  --    Liver Function Tests: Recent Labs  Lab 01/09/19 1145 01/11/19 0353 01/12/19 0258 01/13/19 0403  AST 17 17 11* 18  ALT 40 28 23 26   ALKPHOS 142* 170* 158* 162*  BILITOT 1.1 0.6 0.9 0.7  PROT 4.6* 4.2* 4.5* 4.8*  ALBUMIN 1.6* 1.5* 1.8* 1.9*   No results for input(s): LIPASE, AMYLASE in the last 168 hours. No results for input(s): AMMONIA in the last 168 hours. CBC: Recent Labs  Lab 01/09/19 0353 01/10/19 0538 01/11/19 0353 01/12/19 0258 01/13/19 0403  WBC 1.2* 1.3* 1.3* 2.2* 2.8*  NEUTROABS 0.9* 0.9* 0.8* 1.7 2.0  HGB 6.9* 7.8* 7.8* 8.1* 8.4*  HCT 21.2* 23.3* 25.3* 26.4* 26.5*  MCV 93.8 92.8 95.8 97.4 97.8  PLT 34* 36* 41* 44* 51*   Cardiac Enzymes: No results for input(s): CKTOTAL, CKMB, CKMBINDEX, TROPONINI in the last 168  hours. BNP (last 3 results) No results for input(s): BNP in the last 8760 hours.  ProBNP (last 3 results) No results for input(s): PROBNP in the last 8760 hours.  CBG: Recent Labs  Lab 01/12/19 1124 01/12/19 1652 01/12/19 2047 01/13/19 0758 01/13/19 1123  GLUCAP 251* 225* 191* 101* 169*    Recent Results (from the past 240 hour(s))  Culture, blood (routine x 2)     Status: None   Collection Time: 01/04/19  9:00 AM  Result Value Ref Range Status   Specimen Description   Final    BLOOD PORTA CATH Performed at [facility] [address] [place], [place] [address]    Special Requests   Final    BOTTLES DRAWN AEROBIC AND ANAEROBIC Blood Culture adequate volume Performed at [facility] [address], [place], [place] [address]    Culture   Final    NO GROWTH 5 DAYS Performed at [facility], [place] [address] [place], [place] [address]    Report Status 01/09/2019 FINAL  Final  Culture, blood (routine x 2)     Status: None   Collection Time:  01/04/19 10:09 AM  Result Value Ref Range Status   Specimen Description   Final    BLOOD RIGHT HAND Performed at [facility] [address] [place], [place] [address]    Special Requests   Final    BOTTLES DRAWN AEROBIC AND ANAEROBIC Blood Culture adequate volume Performed at [facility] [address] [place], [place] [address]    Culture   Final    NO GROWTH 5 DAYS Performed at [facility], [place] [address], [place], [place] [address]    Report Status 01/09/2019 FINAL  Final     Studies: No results found.  Scheduled Meds: . B-complex with vitamin C  1 tablet Oral Daily  . cholestyramine light  4 g Oral 3 times per day  . dexamethasone  4 mg Oral Q breakfast  . dronabinol  5 mg Oral BID AC  . feeding supplement (ENSURE ENLIVE)  237 mL Oral TID BM  . feeding supplement (PRO-STAT SUGAR FREE 64)  30 mL Oral BID  . insulin aspart   0-5 Units Subcutaneous QHS  . insulin aspart  0-9 Units Subcutaneous TID WC  . zinc sulfate  220 mg Oral BID   Continuous Infusions:   Principal Problem:   Hyponatremia Active Problems:   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HCC)   Hypokalemia   Diarrhea   Severe protein-calorie malnutrition (HCC)   Pancytopenia (HCC)   Hematochezia   Radiation gastroenteritis   Radiation colitis   Malnutrition of moderate degree   Zinc deficiency   Pressure injury of skin   Other neutropenia (HCC)   Hypomagnesemia   Neutropenia ([place])      Myrlene Broker D Camera Krienke  [facility]

## 2019-01-13 NOTE — Plan of Care (Signed)

## 2019-01-14 LAB — SEDIMENTATION RATE: Sed Rate: 102 mm/hr — ABNORMAL HIGH (ref 0–16)

## 2019-01-14 LAB — CBC WITH DIFFERENTIAL/PLATELET
Abs Immature Granulocytes: 0.11 10*3/uL — ABNORMAL HIGH (ref 0.00–0.07)
Basophils Absolute: 0 10*3/uL (ref 0.0–0.1)
Basophils Relative: 1 %
Eosinophils Absolute: 0 10*3/uL (ref 0.0–0.5)
Eosinophils Relative: 0 %
HCT: 24.5 % — ABNORMAL LOW (ref 39.0–52.0)
Hemoglobin: 7.6 g/dL — ABNORMAL LOW (ref 13.0–17.0)
Immature Granulocytes: 4 %
Lymphocytes Relative: 12 %
Lymphs Abs: 0.3 10*3/uL — ABNORMAL LOW (ref 0.7–4.0)
MCH: 30.4 pg (ref 26.0–34.0)
MCHC: 31 g/dL (ref 30.0–36.0)
MCV: 98 fL (ref 80.0–100.0)
Monocytes Absolute: 0.4 10*3/uL (ref 0.1–1.0)
Monocytes Relative: 16 %
Neutro Abs: 1.8 10*3/uL (ref 1.7–7.7)
Neutrophils Relative %: 67 %
Platelets: 51 10*3/uL — ABNORMAL LOW (ref 150–400)
RBC: 2.5 MIL/uL — ABNORMAL LOW (ref 4.22–5.81)
RDW: 17.9 % — ABNORMAL HIGH (ref 11.5–15.5)
WBC: 2.6 10*3/uL — ABNORMAL LOW (ref 4.0–10.5)
nRBC: 0 % (ref 0.0–0.2)

## 2019-01-14 LAB — BASIC METABOLIC PANEL
Anion gap: 7 (ref 5–15)
BUN: 9 mg/dL (ref 8–23)
CO2: 26 mmol/L (ref 22–32)
Calcium: 8.1 mg/dL — ABNORMAL LOW (ref 8.9–10.3)
Chloride: 103 mmol/L (ref 98–111)
Creatinine, Ser: 0.46 mg/dL — ABNORMAL LOW (ref 0.61–1.24)
GFR calc Af Amer: >60 mL/min (ref >60)
GFR calc non Af Amer: >60 mL/min (ref >60)
Glucose, Bld: 140 mg/dL — ABNORMAL HIGH (ref 70–99)
Potassium: 4 mmol/L (ref 3.5–5.1)
Sodium: 136 mmol/L (ref 135–145)

## 2019-01-14 LAB — GLUCOSE, CAPILLARY
Glucose-Capillary: 145 mg/dL — ABNORMAL HIGH (ref 70–99)
Glucose-Capillary: 229 mg/dL — ABNORMAL HIGH (ref 70–99)
Glucose-Capillary: 272 mg/dL — ABNORMAL HIGH (ref 70–99)
Glucose-Capillary: 158 mg/dL — ABNORMAL HIGH (ref 70–99)

## 2019-01-14 LAB — C-REACTIVE PROTEIN: CRP: 8 mg/dL — ABNORMAL HIGH (ref <1.0)

## 2019-01-14 NOTE — Progress Notes (Signed)
Pt. Changed colostomy bag 3 times this shift,from leakage. Pt developed a dime sized callus below stoma. Pt states he had not noticed it before and their is not pain associated with it. Will continue to monitor.

## 2019-01-14 NOTE — Progress Notes (Signed)
[facility] PROGRESS NOTE  Mark Frey PJS:[id number] DOB: Feb 11, 1956 DOA: 01/01/2019 PCP: [name], MD  Assessment/Plan:  Neutropenic fever in patient with b cell lymphoma and radiation colitis, improved. No fever since 4/25. Blood cultures with no growth.  Has completed cefepime and Flagyl therapy.  Oncology recently increased his Granix dosing to improve neutropenia on 4/27, His neutrophil count has remained stable at 1800 , may be able to discontinue per oncology if [place] remains greater than 1500.  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 ESR and CRP have both down trended, infectious workup with C diff, GIP panel, CMV, EBV all negative. GI following no plans for interventions, stopped fatty acid enema due to neutropenia. Continue cholestyramine added on 4/28 for high ostomy output and reassess. Hopeful patient will be able to improve oral intake otherwise may need to consider TPN.  Has completed Flagyl therapy.Daily ESR/CRP to monitor bowel inflammation  Diminished nutrition, improving significantly. Supplementations provided by nutrition. Eating at least 50% of meals.  Currently undergoing calorie count for objective data to determine need for potential TPN.  Oncology increased Marinol to 5 mg p.o. twice daily to help with nausea and will continue low-dose dexamethasone to optimize his p.o. intake/improved bowel inflammation.  High grade B-cell lymphoma. Oncology following, undergoing outpatient radiation therapy. 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  No longer bleeding.  Hemoglobin and platelets remains stable. WBC improving Received 1 U PRBC so far this hospital stay.  Transfuse goal platelets greater than 20,000, (50,000 if bleeding), hemoglobin  greater than 7.5  HX of DVT/Pe. Holding eliquis while thrombocytopenic and GI bleeding on admission from severe radiation colitis.  Continue SCDs for DVT prophylaxis  Hypovolemic hyponatremia, stable improved, previous nadir of 120, currently ranging 131-134.  Monitor BMP.  Will discontinue IV fluids for now and closely monitor  Hypokalemia and hypomagnesemia, improving supplement orally and intravenously respectively, check mag and K as needed  Zinc deficiency.  Receiving zinc supplementation daily  Code Status: Full code Family Communication: No family at bedside (indicate person spoken with, relationship, and if by phone, the number) Disposition Plan: Improvement in neutropenia and sustained improvement, stability of hemoglobin/platelets, stable/improved nutrition, currently undergoing calorie count in case TPN is needed. Discussed with [name] on 5/1 his improvement, hopeful for discharge in mid next week if nutrition status and neutropenia is optimized   Consultants:  GI, oncology  Procedures:  None  Antibiotics: Cefepime 4/23-4/29 Flagyl 4/27- Vancomycin 4/23- HPI/Subjective:  Mark Frey is a 63 y.o. 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, DVT/PE on Eliquis, diabetes type 2  who presented on 01/01/2019 with diarrhea for 2 to 3 weeks and severe weakness and was found to have radiation-induced severe pancolitis with electrolyte derangements with course complicated by neutropenic fever  Continues to report improved appetite 3 ostomy bag changes overnight No fevers or chills No cough No abdominal pain No reported GI bleed   Objective: Vitals:   01/14/19 0446 01/14/19 1128  BP: 108/74   Pulse: 77   Resp: 14   Temp: 97.9 F (36.6 C)   SpO2: 98% 92%    Intake/Output Summary (Last 24 hours) at 01/14/2019  1534 Last data filed  at 01/14/2019 1200 Gross per 24 hour  Intake -  Output 1300 ml  Net -1300 ml   Filed Weights   01/01/19 1052 01/02/19 2050  Weight: 90.7 kg 86.3 kg    Exam:   General: Obese male lying in bed, comfortably no distress  Cardiovascular: Regular rate and rhythm, no appreciable murmurs rubs or gallops, no peripheral edema  Respiratory: Normal respiratory effort on room air  Abdomen: Soft, nondistended, nontender, diminished bowel sounds, liquid stool in ostomy bag  Musculoskeletal: Normal range of motion  Skin no rashes or lesions  Neurologic no appreciable focal deficits  Data Reviewed: Basic Metabolic Panel: Recent Labs  Lab 01/08/19 0212 01/09/19 0353 01/10/19 0538 01/11/19 0353 01/11/19 1500 01/12/19 0258 01/12/19 1714 01/13/19 0403 01/14/19 0439  NA 131* 131* 132* 134*  --  134*  --  136 136  K 3.3* 3.2* 3.4* 3.9  --  4.2  --  3.8 4.0  CL 103 103 103 106  --  105  --  105 103  CO2 20* _0 --  22  --  25 26  GLUCOSE 120* 108* 113* 117*  --  171*  --  116* 140*  BUN 8 8 7* 7*  --  9  --  11 9  CREATININE 0.49* 0.37* 0.49* 0.39*  --  0.35*  --  0.37* 0.46*  CALCIUM 7.1* 7.2* 7.4* 7.3*  --  7.7*  --  7.8* 8.1*  MG 1.7 1.4* 1.6*  --  1.6*  --  2.0  --   --    Liver Function Tests: Recent Labs  Lab 01/09/19 1145 01/11/19 0353 01/12/19 0258 01/13/19 0403  AST 17 17 11* 18  ALT 40 _1 ALKPHOS 142* 170* 158* 162*  BILITOT 1.1 0.6 0.9 0.7  PROT 4.6* 4.2* 4.5* 4.8*  ALBUMIN 1.6* 1.5* 1.8* 1.9*   No results for input(s): LIPASE, AMYLASE in the last 168 hours. No results for input(s): AMMONIA in the last 168 hours. CBC: Recent Labs  Lab 01/10/19 0538 01/11/19 0353 01/12/19 0258 01/13/19 0403 01/14/19 0439  WBC 1.3* 1.3* 2.2* 2.8* 2.6*  NEUTROABS 0.9* 0.8* 1.7 2.0 1.8  HGB 7.8* 7.8* 8.1* 8.4* 7.6*  HCT 23.3* 25.3* 26.4* 26.5* 24.5*  MCV 92.8 95.8 97.4 97.8 98.0  PLT 36* 41* 44* 51* 51*   Cardiac Enzymes: No results  for input(s): CKTOTAL, CKMB, CKMBINDEX, TROPONINI in the last 168 hours. BNP (last 3 results) No results for input(s): BNP in the last 8760 hours.  ProBNP (last 3 results) No results for input(s): PROBNP in the last 8760 hours.  CBG: Recent Labs  Lab 01/13/19 1123 01/13/19 1628 01/13/19 2226 01/14/19 0739 01/14/19 1226  GLUCAP 169* 128* 248* 145* 229*    No results found for this or any previous visit (from the past 240 hour(s)).   Studies: No results found.  Scheduled Meds: . B-complex with vitamin C  1 tablet Oral Daily  . cholestyramine light  4 g Oral 3 times per day  . dexamethasone  4 mg Oral Q breakfast  . dronabinol  5 mg Oral BID AC  . feeding supplement (ENSURE ENLIVE)  237 mL Oral TID BM  . feeding supplement (PRO-STAT SUGAR FREE 64)  30 mL Oral BID  . insulin aspart  0-5 Units Subcutaneous QHS  . insulin aspart  0-9 Units Subcutaneous TID WC  . zinc sulfate  220 mg Oral BID   Continuous Infusions:   Principal Problem:  Hyponatremia Active Problems:   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HCC)   Hypokalemia   Diarrhea   Severe protein-calorie malnutrition (HCC)   Pancytopenia (HCC)   Hematochezia   Radiation gastroenteritis   Radiation colitis   Malnutrition of moderate degree   Zinc deficiency   Pressure injury of skin   Other neutropenia (HCC)   Hypomagnesemia   Neutropenia ([place])      Myrlene Broker D Kaylee Wombles  [facility]

## 2019-01-15 LAB — CBC WITH DIFFERENTIAL/PLATELET
Abs Immature Granulocytes: 0.03 10*3/uL (ref 0.00–0.07)
Basophils Absolute: 0 10*3/uL (ref 0.0–0.1)
Basophils Relative: 1 %
Eosinophils Absolute: 0 10*3/uL (ref 0.0–0.5)
Eosinophils Relative: 0 %
HCT: 24.4 % — ABNORMAL LOW (ref 39.0–52.0)
Hemoglobin: 7.7 g/dL — ABNORMAL LOW (ref 13.0–17.0)
Immature Granulocytes: 1 %
Lymphocytes Relative: 12 %
Lymphs Abs: 0.3 10*3/uL — ABNORMAL LOW (ref 0.7–4.0)
MCH: 30.8 pg (ref 26.0–34.0)
MCHC: 31.6 g/dL (ref 30.0–36.0)
MCV: 97.6 fL (ref 80.0–100.0)
Monocytes Absolute: 0.5 10*3/uL (ref 0.1–1.0)
Monocytes Relative: 17 %
Neutro Abs: 1.9 10*3/uL (ref 1.7–7.7)
Neutrophils Relative %: 69 %
Platelets: 54 10*3/uL — ABNORMAL LOW (ref 150–400)
RBC: 2.5 MIL/uL — ABNORMAL LOW (ref 4.22–5.81)
RDW: 18.1 % — ABNORMAL HIGH (ref 11.5–15.5)
WBC: 2.7 10*3/uL — ABNORMAL LOW (ref 4.0–10.5)
nRBC: 0 % (ref 0.0–0.2)

## 2019-01-15 LAB — C-REACTIVE PROTEIN: CRP: 4.6 mg/dL — ABNORMAL HIGH (ref <1.0)

## 2019-01-15 LAB — BASIC METABOLIC PANEL
Anion gap: 6 (ref 5–15)
BUN: 10 mg/dL (ref 8–23)
CO2: 25 mmol/L (ref 22–32)
Calcium: 7.8 mg/dL — ABNORMAL LOW (ref 8.9–10.3)
Chloride: 103 mmol/L (ref 98–111)
Creatinine, Ser: 0.38 mg/dL — ABNORMAL LOW (ref 0.61–1.24)
GFR calc Af Amer: >60 mL/min (ref >60)
GFR calc non Af Amer: >60 mL/min (ref >60)
Glucose, Bld: 118 mg/dL — ABNORMAL HIGH (ref 70–99)
Potassium: 3.6 mmol/L (ref 3.5–5.1)
Sodium: 134 mmol/L — ABNORMAL LOW (ref 135–145)

## 2019-01-15 LAB — GLUCOSE, CAPILLARY
Glucose-Capillary: 104 mg/dL — ABNORMAL HIGH (ref 70–99)
Glucose-Capillary: 194 mg/dL — ABNORMAL HIGH (ref 70–99)

## 2019-01-15 LAB — SEDIMENTATION RATE: Sed Rate: 98 mm/hr — ABNORMAL HIGH (ref 0–16)

## 2019-01-15 MED ORDER — TRAMADOL HCL 50 MG PO TABS
50.0000 mg | ORAL_TABLET | Freq: Four times a day (QID) | ORAL | Status: DC | PRN
  Administered 2019-01-15 (×2): 50 mg via ORAL
  Filled 2019-01-15 (×2): qty 1

## 2019-01-15 MED ORDER — ENSURE ENLIVE PO LIQD: 237.0000 mL | Freq: Two times a day (BID) | ORAL | Status: DC

## 2019-01-15 NOTE — Progress Notes (Signed)
Patient was encouraged to ambulate. Education provided on the importance of mobilization.

## 2019-01-15 NOTE — Progress Notes (Signed)
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  Thought to be radiation induced diarrhea due to radiation colitis    PT Comments    Assist with amb a greater distance.  Pt progressing well with his mobility.  Pt is eager to get back home.   Follow Up Recommendations  Home health PT     Equipment Recommendations  None recommended by PT    Recommendations for Other Services       Precautions / Restrictions Precautions Precautions: None    Mobility  Bed Mobility Overal bed mobility: Modified Independent                Transfers Overall transfer level: Modified independent Equipment used: Rolling walker (2 wheeled) Transfers: Sit to/from [employer] Transfers Sit to Stand: Modified independent (Device/Increase time) Stand pivot transfers: Modified independent (Device/Increase time)       General transfer comment: good use of hands to steady self  Ambulation/Gait Ambulation/Gait assistance: Supervision Gait Distance (Feet): 500 Feet Assistive device: Rolling walker (2 wheeled) Gait Pattern/deviations: Step-through pattern     General Gait Details: tolerated an increased distance   Stairs             Wheelchair Mobility    Modified Rankin (Stroke Patients Only)       Balance                                            Cognition Arousal/Alertness: Awake/alert Behavior During Therapy: WFL for tasks assessed/performed Overall Cognitive Status: Within Functional Limits for tasks assessed                                  General Comments: "Ready to get the H*LL out of here"      Exercises      General Comments        Pertinent Vitals/Pain Pain Assessment: No/denies pain    Home Living                      Prior Function            PT Goals (current goals can now be found in the care plan section) Progress towards PT goals: Progressing toward goals    Frequency    Min 3X/week      PT Plan Current plan remains appropriate    Co-evaluation              AM-PAC PT "6 Clicks" Mobility   Outcome Measure  Help needed turning from your back to your side while in a flat bed without using bedrails?: None Help needed moving from lying on your back to sitting on the side of a flat bed without using bedrails?: None Help needed moving to and from a bed to a chair (including a wheelchair)?: None Help needed standing up from a chair using your arms (e.g., wheelchair or bedside chair)?: A Little Help needed to walk in  hospital room?: A Little Help needed climbing 3-5 steps with a railing? : A Little 6 Click Score: 21    End of Session Equipment Utilized During Treatment: Gait belt Activity Tolerance: Patient tolerated treatment well Patient left: in bed;with call bell/phone within reach Nurse Communication: Mobility status PT Visit Diagnosis: Unsteadiness on feet (R26.81)     Time: 1435-1450 PT Time Calculation (min) (ACUTE ONLY): 15 min  Charges:  $Gait Training: 8-22 mins                     {Bhavya Grand  PTA Acute  Rehabilitation [employer]      [phone] Office      [phone]

## 2019-01-15 NOTE — TOC Initial Note (Signed)
Transition of Care [facility]) - Initial/Assessment Note    Patient Details  Name: Mark Frey MRN: [id number] Date of Birth: 10-Dec-1955  [facility]) CM/SW Contact:    Purcell Mouton, RN Phone Number: 01/15/2019, 4:05 PM  Clinical Narrative:                   Expected Discharge Plan: [place] Barriers to Discharge: No Barriers Identified   Patient Goals and CMS Choice     Choice offered to / list presented to : Spouse  Expected Discharge Plan and Services Expected Discharge Plan: [place]   Discharge Planning Services: CM Consult Post Acute Care Choice: [place] arrangements for the past 2 months: Single Family Home Expected Discharge Date: (unknown)                         HH Arranged: RN, PT [place] Agency: [place] ([place]) Date HH Agency Contacted: 01/15/19 Time [place]: 1310 Representative spoke with at [place]: Santiago Glad  Prior Living Arrangements/Services Living arrangements for the past 2 months: [place] with:: Spouse Patient language and need for interpreter reviewed:: No Do you feel safe going back to the place where you live?: Yes          Current home services: Home PT    Activities of Daily Living Home Assistive Devices/Equipment: CBG Meter, Walker (specify type), Eyeglasses, Cane (specify quad or straight), Ostomy supplies(single point cane, 4-wheeled walker, colostomy) ADL Screening (condition at time of admission) Patient's cognitive ability adequate to safely complete daily activities?: Yes Is the patient deaf or have difficulty hearing?: No Does the patient have difficulty seeing, even when wearing glasses/contacts?: No Does the patient have difficulty concentrating, remembering, or making decisions?: Yes(has been a little confused lately) Patient able to express need for assistance with ADLs?: Yes Does the patient have difficulty dressing or  bathing?: Yes Independently performs ADLs?: No Communication: Independent Dressing (OT): Needs assistance Is this a change from baseline?: Change from baseline, expected to last >3 days Grooming: Needs assistance Is this a change from baseline?: Change from baseline, expected to last >3 days Feeding: Needs assistance Is this a change from baseline?: Change from baseline, expected to last >3 days Bathing: Needs assistance Is this a change from baseline?: Change from baseline, expected to last >3 days Toileting: Dependent Is this a change from baseline?: Change from baseline, expected to last >3days In/Out Bed: Dependent Is this a change from baseline?: Change from baseline, expected to last >3 days Walks in Home: Dependent Is this a change from baseline?: Change from baseline, expected to last >3 days Does the patient have difficulty walking or climbing stairs?: Yes Weakness of Legs: Both Weakness of Arms/Hands: Both  Permission Sought/Granted Permission sought to share information with : Case Manager                Emotional Assessment Appearance:: Appears stated age   Affect (typically observed): Accepting, Stable Orientation: : Oriented to Self, Oriented to Place, Oriented to  Time, Oriented to Situation      Admission diagnosis:  Dehydration [E86.0] Hyponatremia [E87.1] Patient Active Problem List   Diagnosis Date Noted  . Hypomagnesemia 01/11/2019  . Neutropenia ([place]) 01/11/2019  . Other neutropenia ([place])   . Pressure injury of skin 01/07/2019  . Zinc deficiency   . Malnutrition of moderate degree 01/04/2019  . Radiation gastroenteritis   .  Radiation colitis   . Hyponatremia 01/01/2019  . Pancytopenia ([place]) 01/01/2019  . Hematochezia   . Abnormal CT of the abdomen   . Severe protein-calorie malnutrition ([place]) 08/19/2018  . 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 Diverticulitis of colon   . Low magnesium level 07/30/2018  . Hypokalemia 07/21/2018  .  Bowel obstruction in setting of lymphoma and sigmoid stricture 07/21/2018  . Diarrhea 07/21/2018  . 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 Diabetes mellitus ([place])   . Edema   . Deep vein thrombosis (DVT) of femoral vein of right lower extremity ([place])   . Counseling regarding advance care planning and goals of care 04/25/2018  . Gastrointestinal hemorrhage   . Encounter for antineoplastic chemotherapy   . 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[place]) 04/10/2018  . Port-A-Cath in place 03/27/2018  . Acute deep vein thrombosis (DVT) of femoral vein of right lower extremity ([place])   . 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 Bilateral pulmonary embolism ([place]) 03/10/2018  . Anemia 03/10/2018  . Hypoglycemia 03/10/2018  . Occult blood in stools 03/10/2018  . Abdominal mass 03/10/2018  . Tachycardia 01/31/2017  . Controlled diabetes mellitus type II without complication ([place]) 03/49/1791  . Hypertension 06/05/2014  . Irritable bowel syndrome 03/30/2010  . Morbid obesity ([place]) 12/29/2009  . Dysmetabolic syndrome X 50/56/9794  . BACK PAIN WITH RADICULOPATHY 06/09/2007  . Hyperlipidemia 05/12/2007  . ALLERGIC RHINITIS 05/12/2007   PCP:  [name], MD Pharmacy:   [id number] [place] [address] [place] [place]Phone: [phone]: [phone] [facility] [id number]: [phone]: [phone]    Social Determinants of Health (SDOH) Interventions    Readmission Risk Interventions No flowsheet data found.

## 2019-01-15 NOTE — Progress Notes (Signed)
HEMATOLOGY/ONCOLOGY INPATIENT PROGRESS NOTE  Date of Service: 01/12/2019  Inpatient Attending: .Desiree Hane, MD   SUBJECTIVE:   Mark Frey was seen in follow-up.  No fevers. Off antibiotics.  P.o. intake is fair.  States that he does not like the food.  Remains on dexamethasone and marinol.  Has been ambulating in the hallway over the weekend but has not ambulated yet today. Neutropenia resolved.  Stable hemoglobin and platelets. Ileostomy output is only a small amount..  OBJECTIVE:  Resting comfortably in bed  PHYSICAL EXAMINATION: .VS reviewed  Filed Weights   01/01/19 1052 01/02/19 2050  Weight: 200 lb (90.7 kg) 190 lb 4.1 oz (86.3 kg)   .Body mass index is 28.1 kg/m.  GENERAL: more awake and alert today. OROPHARYNX: Oral mucosa moist LUNGS: clear to auscultation HEART: regular rate & rhythm ABDOMEN: Mild tenderness to deep palpation over the left part of the abdomen especially left lower quadrant Musculoskeletal: 1+ pedal edema PSYCH: alert & oriented x 3 . Appears brighter and more conversant. NEURO: no focal motor/sensory deficits  MEDICAL HISTORY:  Past Medical History:  Diagnosis Date   ALLERGIC RHINITIS    Cancer ([place])    Lymphoma    Diabetes mellitus    Hyperlipidemia     SURGICAL HISTORY: Past Surgical History:  Procedure Laterality Date   BIOPSY  03/15/2018   Procedure: BIOPSY;  Surgeon: Milus Banister, MD;  Location: [facility];  Service: Endoscopy;;   BOWEL RESECTION N/A 08/25/2018   Procedure: LAPROSCOPIC LOOP ILEOSTOMY;  Surgeon: Greer Pickerel, MD;  Location: [name];  Service: General;  Laterality: N/A;   CLEFT PALATE REPAIR     COLONOSCOPY N/A 03/15/2018   Procedure: COLONOSCOPY;  Surgeon: Milus Banister, MD;  Location: [name];  Service: Endoscopy;  Laterality: N/A;   COLONOSCOPY N/A 08/20/2018   Procedure: COLONOSCOPY;  Surgeon: Irene Shipper, MD;  Location: [facility];  Service: Endoscopy;  Laterality: N/A;    deviated septum repair     slight improvement   ESOPHAGOGASTRODUODENOSCOPY N/A 03/15/2018   Procedure: ESOPHAGOGASTRODUODENOSCOPY (EGD);  Surgeon: Milus Banister, MD;  Location: [name];  Service: Endoscopy;  Laterality: N/A;   IR IMAGING GUIDED PORT INSERTION  03/17/2018   LAPAROSCOPY N/A 08/25/2018   Procedure: LAPAROSCOPY DIAGNOSTIC;  Surgeon: Greer Pickerel, MD;  Location: [facility];  Service: General;  Laterality: N/A;   TONSILLECTOMY      SOCIAL HISTORY: Social History   Socioeconomic History   Marital status: Married    Spouse name: lisa   Number of children: 0   Years of education: Not on file   Highest education level: Not on file  Occupational History   Not on file  Social Needs   Financial resource strain: Not hard at all   Food insecurity:    Worry: Never true    Inability: Never true   Transportation needs:    Medical: No    Non-medical: No  Tobacco Use   Smoking status: Never Smoker   Smokeless tobacco: Never Used  Substance and Sexual Activity   Alcohol use: Yes    Comment: occasional   Drug use: No   Sexual activity: Yes  Lifestyle   Physical activity:    Days per week: 0 days    Minutes per session: 0 min   Stress: Not at all  Relationships   Social connections:    Talks on phone: More than three times a week    Gets together: More than three times  a week    Attends religious service: 1 to 4 times per year    Active member of club or organization: No    Attends meetings of clubs or organizations: Never    Relationship status: Married   Intimate partner violence:    Fear of current or ex partner: No    Emotionally abused: No    Physically abused: No    Forced sexual activity: No  Other Topics Concern   Not on file  Social History Narrative   Married 1985. No kids. 4 small dogs.       Works in Financial trader, residential      Hobbies: work on cars, Haematologist, exercise as able    FAMILY HISTORY: Family History    Problem Relation Age of Onset   Lung cancer Mother        smoker   Brain cancer Mother        metastasis   AAA (abdominal aortic aneurysm) Father        smoker    ALLERGIES:  is allergic to ciprofloxacin.  MEDICATIONS:  Scheduled Meds:  B-complex with vitamin C  1 tablet Oral Daily   cholestyramine light  4 g Oral 3 times per day   dexamethasone  4 mg Oral Q breakfast   dronabinol  5 mg Oral BID AC   feeding supplement (ENSURE ENLIVE)  237 mL Oral TID BM   feeding supplement (PRO-STAT SUGAR FREE 64)  30 mL Oral BID   insulin aspart  0-5 Units Subcutaneous QHS   insulin aspart  0-9 Units Subcutaneous TID WC   zinc sulfate  220 mg Oral BID   Continuous Infusions:  PRN Meds:.acetaminophen **OR** acetaminophen, ondansetron **OR** ondansetron (ZOFRAN) IV, sodium chloride flush, traMADol  REVIEW OF SYSTEMS:    10 Point review of Systems was done is negative except as noted above.   LABORATORY DATA:  I have reviewed the data as listed  . Component     Latest Ref Rng & Units 01/11/2019 01/12/2019  WBC     4.0 - 10.5 K/uL 1.3 (LL) 2.2 (L)  RBC     4.22 - 5.81 MIL/uL 2.64 (L) 2.71 (L)  Hemoglobin     13.0 - 17.0 g/dL 7.8 (L) 8.1 (L)  HCT     39.0 - 52.0 % 25.3 (L) 26.4 (L)  MCV     80.0 - 100.0 fL 95.8 97.4  MCH     26.0 - 34.0 pg 29.5 29.9  MCHC     30.0 - 36.0 g/dL 30.8 30.7  RDW     11.5 - 15.5 % 18.5 (H) 18.3 (H)  Platelets     150 - 400 K/uL 41 (L) 44 (L)  nRBC     0.0 - 0.2 % 0.0 0.0  Neutrophils     % 62 74  NEUT#     1.7 - 7.7 K/uL 0.8 (L) 1.7  Lymphocytes     % 12 6  Lymphocyte #     0.7 - 4.0 K/uL 0.2 (L) 0.1 (L)  Monocytes Relative     % 16 14  Monocyte #     0.1 - 1.0 K/uL 0.2 0.3  Eosinophil     % 1 0  Eosinophils Absolute     0.0 - 0.5 K/uL 0.0 0.0  Basophil     % 2 2  Basophils Absolute     0.0 - 0.1 K/uL 0.0 0.0  Immature Granulocytes     %  7 4  Abs Immature Granulocytes     0.00 - 0.07 K/uL 0.09 (H) 0.09 (H)  Dohle  Bodies      PRESENT   Ovalocytes      PRESENT PRESENT  Sodium     135 - 145 mmol/L 134 (L) 134 (L)  Potassium     3.5 - 5.1 mmol/L 3.9 4.2  Chloride     98 - 111 mmol/L 106 105  CO2     22 - 32 mmol/L 22 22  Glucose     70 - 99 mg/dL 117 (H) 171 (H)  BUN     8 - 23 mg/dL 7 (L) 9  Creatinine     0.61 - 1.24 mg/dL 0.39 (L) 0.35 (L)  Calcium     8.9 - 10.3 mg/dL 7.3 (L) 7.7 (L)  Total Protein     6.5 - 8.1 g/dL 4.2 (L) 4.5 (L)  Albumin     3.5 - 5.0 g/dL 1.5 (L) 1.8 (L)  AST     15 - 41 U/L 17 11 (L)  ALT     0 - 44 U/L 28 23  Alkaline Phosphatase     38 - 126 U/L 170 (H) 158 (H)  Total Bilirubin     0.3 - 1.2 mg/dL 0.6 0.9  GFR, Est Non African American     >60 mL/min >60 >60  GFR, Est African American     >60 mL/min >60 >60  Anion gap     5 - 15 6 7      RADIOGRAPHIC STUDIES: I have personally reviewed the radiological images as listed and agreed with the findings in the report. Dg Chest 2 View  Result Date: 01/01/2019 CLINICAL DATA:  Short of breath.  Recent chemotherapy for lymphoma EXAM: CHEST - 2 VIEW COMPARISON:  08/24/2018 FINDINGS: Heart size and vascularity normal. Mild linear densities in the right lung base are new and may be atelectasis. No pleural effusion. Port-A-Cath tip SVC. IMPRESSION: Mild right lower lobe atelectasis.  Negative for pneumonia. Electronically Signed   By: Franchot Gallo M.D.   On: 01/01/2019 19:38   Ct Abdomen Pelvis W Contrast  Result Date: 01/01/2019 CLINICAL DATA:  Diarrhea and weakness. History of Burkitt's lymphoma. Recently completed radiation. Ileostomy EXAM: CT ABDOMEN AND PELVIS WITH CONTRAST TECHNIQUE: Multidetector CT imaging of the abdomen and pelvis was performed using the standard protocol following bolus administration of intravenous contrast. CONTRAST:  162m OMNIPAQUE IOHEXOL 300 MG/ML  SOLN COMPARISON:  CT abdomen pelvis 10/05/2018 FINDINGS: Lower chest: Mild scarring in the lung bases. Negative for infiltrate or  effusion. Hepatobiliary: Small subcentimeter hypodensities in the liver are unchanged and likely benign cysts. Hypodensity adjacent to the interhemispheric fissure unchanged compatible with fatty infiltration of the liver. Gallbladder contracted and mildly thickened. No biliary dilatation. Pancreas: Negative Spleen: Negative Adrenals/Urinary Tract: 5 mm right lower pole calculus and 2 mm right midpole calculus. No renal obstruction or mass. Negative bladder. Stomach/Bowel: Ileostomy. Marked thickening of the entire colon from cecum to rectum with extensive stranding in the pericolonic fat. No free air or free fluid. Small bowel nondilated and non thickened. Vascular/Lymphatic: Minimal atherosclerotic disease in the aorta. Negative for lymphadenopathy. Reproductive: Moderate prostate enlargement with numerous calcifications. Other: Negative for abscess Musculoskeletal: Lumbar degenerative changes without acute skeletal abnormality. IMPRESSION: Findings compatible with severe colitis. Diffuse thickening of the colon with surrounding edema in the pericolonic fat. No abscess or free fluid. No small bowel obstruction. Ileostomy noted in the right lower  quadrant. Negative for mass or adenopathy. Electronically Signed   By: Franchot Gallo M.D.   On: 01/01/2019 19:37    ASSESSMENT & PLAN:   63 y.o. male with  1. 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 No BM or CNS involvement.    04/18/18 PET/CT revealed Continued improvement in right colonic wall thickening and surrounding bulky masses consistent with treated lymphoma. No hypermetabolic activity to suggest residual tumor. There is some hypermetabolic activity associated with the sigmoid colon which demonstrates mild wall thickening, surrounding inflammation and underlying diverticulosis, suggesting mild diverticulitis. Suspected treatment related changes throughout the bone marrow. There is mildly increased activity within the clivus without clear corresponding finding on the CT images. Attention on follow-up recommended. Nonobstructing right renal calculi. 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   06/09/18 PET/CT revealedSoft tissue lesion within the ventral pelvis is again identified demonstrating mild to moderate increased uptake within SUV max of 5.61. Deauville criteria 4. Concerning for residual metabolically active tumor. 2. Similar appearance of diffusely thickened appendix within the right lower quadrant of the abdomen. Findings may reflect treatment related changes. 3. Increased radiotracer uptake throughout the bone marrow which is favored to represent treatment related changes. 4. Lad coronary artery atherosclerotic calcifications.   S/p 6 cycles of EPOCH-R completed on 07/03/18  10/05/18 CT A/P revealed Persistent wall thickening in the sigmoid colon although a component of this could be due to diverticulosis. There is also a suggestion of wall thickening and potential mucosal enhancement in the terminal ileum, as well as adjacent indistinct stranding in the mesenteric and omental adipose tissues roughly similar to the prior exam. Some of this may be treatment related. Inflammatory process such as Crohn's disease not excluded. 2. No current adenopathy. 3. Other imaging findings of potential clinical significance: Nonobstructive right nephrolithiasis. Aortic Atherosclerosis. Moderate prostatomegaly. Left foraminal  impingement at L4-5.  S/p IMRT of 36Gy over 20 fractions, completed 12/06/18.  2.H/oRLE DVT and b/l PE.  Anticoagulation held at this time in the setting of thrombocytopenia and GI bleeding from severe radiation colitis. -May consider prophylactic Lovenox while hospitalized if platelets above 50k and no overt GI bleeding with stable hemoglobin. -SCD for DVT prophylaxis.  3. H/o Small bowel obstruction/ileus with sigmoid thickening/stenosis . CT scan - sigmoid obstructive lesion ? Scar tissue from resolved lymphoma vs residual lymphoma vs post inflammatory scarring from diverticulitis/limited perforation. Also has SBO from adhesions. Colonoscopy 08/21/2018 - no intramucosal lesions. Extrinsic compression of sigmoid colon. 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 Greer Pickerel  4.  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  GI panel and C. difficile negative.  CMV IgM negative, EBV IgM negative. 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  5. resolved Dehydration with hypovolemic hyponatremia.  Other electrolyte issues including hypokalemia and hypomagnesemia -stabilizing . 6.  Pancytopenia Anemia likely multifactorial but could be from GI bleeding plus significant inflammation from severe radiation colitis. Leukopenia/lymphopenia likely from radiation toxicity.  Protein-losing enteropathy can also accentuate the lymphopenia and bone marrow suppression  due to severe inflammatory state from his radiation colitis. Thrombocytopenia from consumption related to GI bleeding and from radiation toxicity. No overt evidence of DIC. Significantly elevated fibrinogen level as a marker of severe inflammation and acute phase reactant. B12 within normal limits Copper levels within normal limits at 132  zinc deficiency noted- 44 Viral work-up ordered and currently pending Low likelihood of Burkitt's lymphoma progression at this  time.   Plan  -P.o. intake is slowly improving. -Currently undergoing calorie counting to determine need for TPN. Given improvement in po intake appears patient might be able to hold off on TPN needs. Albumin is starting to improve. -remains off IVF and stable sodium. -Neutropenia resolved. If ANC>1500 tomorrow will discontinue granix. -continue low-dose dexamethasone 4 mg p.o. daily to optimize his p.o. intake and possibly help with bowel inflammation. -Continue Marinol to 44m po BID to help p.o. intake and help with some element of chronic nausea. -Patient was strongly encouraged in the presence of his wife on the phone to increase ambulation and p.o. intake as much as possible. -blood counts appear to be turning the corner. -GI following for management of radiation pancolitis. neutropenia now resolved - opens options for enemas if indicated -track sed rate and CRP as a function of resoliving bowel inflammation. - oral replacement for zinc deficiency. -Empiric B complex. -Transfuse PRBC PRN for hemoglobin of less than 7.5 or if actively bleeding or if symptomatic.  Has not needed transfusions for 2 days which is a good sign.  Platelets also appear to be stabilizing. -Transfuse platelets as needed for platelet count of less than 20,000 in the setting of GI ulceration or if actively bleeding and less than 50,000. -We will continue to follow -Anticipate discharge later this week with Home care services and Home PT/OT  The total time spent in the appointment was 25 minutes and more than 50% was on counseling and direct patient cares.    KMikey Bussing DNP, AGPCNP-BC, AOCNP

## 2019-01-15 NOTE — Progress Notes (Signed)
[facility] PROGRESS NOTE  Mark Frey HYQ:[id number] DOB: [birth year] DOA: 01/01/2019 PCP: [name], MD  Assessment/Plan:  Neutropenic fever in patient with b cell lymphoma and radiation colitis, improved. No fever since 4/25. Blood cultures with no growth.  Has completed cefepime and Flagyl therapy.  Oncology recently increased his Granix dosing to improve neutropenia on 4/27, His neutrophil count has remained stable at 1800 , may be able to discontinue per oncology if [place] remains greater than 1500 on 5/5.  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 ESR and CRP have both down trended, infectious workup with C diff, GIP panel, CMV, EBV all negative. GI following no plans for interventions, stopped fatty acid enema due to neutropenia. Continue cholestyramine added on 4/28 for high ostomy output and reassess.  Has completed Flagyl therapy.Daily ESR/CRP to monitor bowel inflammation  Diminished nutrition, improving significantly. Supplementations provided by nutrition. Eating at least 50% of meals.  Currently undergoing calorie count for objective data to determine need for potential TPN.  Will ensure daily weights, continue supplements per nutrition recs. Oncology increased Marinol to 5 mg p.o. twice daily to help with nausea and will continue low-dose dexamethasone to optimize his p.o. intake/improved bowel inflammation.  High grade B-cell lymphoma. Oncology following, undergoing outpatient radiation therapy. 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  No longer bleeding.  Hemoglobin and platelets remains stable. WBC improving Received 1 U PRBC so far this hospital stay.  Transfuse goal platelets greater than 20,000, (50,000 if bleeding), hemoglobin greater than  7.5  HX of DVT/Pe. Holding eliquis while thrombocytopenic and GI bleeding on admission from severe radiation colitis.  Continue SCDs for DVT prophylaxis  Hypovolemic hyponatremia, stable improved, previous nadir of 120, currently ranging 131-134.  Monitor BMP.  Will discontinue IV fluids for now and closely monitor  Hypokalemia and hypomagnesemia, improving supplement orally and intravenously respectively, check mag and K as needed  Zinc deficiency.  Receiving zinc supplementation daily  Code Status: Full code Family Communication: No family at bedside (indicate person spoken with, relationship, and if by phone, the number) Disposition Plan: Improvement in neutropenia and sustained improvement, stability of hemoglobin/platelets, stable/improved nutrition, currently undergoing calorie count in case TPN is needed. Discussed with [name] on 5/1 his improvement, hopeful for discharge in mid of this week if nutrition status and neutropenia is optimized   Consultants:  GI, oncology  Procedures:  None  Antibiotics: Cefepime 4/23-4/29 Flagyl 4/27-4/29 Vancomycin 4/23-4/29 HPI/Subjective:  Mark Frey is a 63 y.o. 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, DVT/PE on Eliquis, diabetes type 2  who presented on 01/01/2019 with diarrhea for 2 to 3 weeks and severe weakness and was found to have radiation-induced severe pancolitis with electrolyte derangements with course complicated by neutropenic fever  Continues to report improved appetite 2 ostomy bag changes overnight Mild abdominal pain, improved with tramadol this am No fevers or chills No cough No abdominal pain No reported GI bleed   Objective: Vitals:   01/15/19 0704 01/15/19 1256  BP: 127/83 111/70  Pulse: 92 96  Resp: 16 19  Temp: 97.8 F (36.6 C) 98.1 F  (36.7 C)  SpO2: 97% 97%    Intake/Output Summary (Last  24 hours) at 01/15/2019 1604 Last data filed at 01/15/2019 1456 Gross per 24 hour  Intake 270 ml  Output 2450 ml  Net -2180 ml   Filed Weights   01/01/19 1052 01/02/19 2050  Weight: 90.7 kg 86.3 kg    Exam:   General: Obese male lying in bed, comfortably no distress  Cardiovascular: Regular rate and rhythm, no appreciable murmurs rubs or gallops, no peripheral edema  Respiratory: Normal respiratory effort on room air  Abdomen: Soft, nondistended, nontender, diminished bowel sounds, liquid stool in ostomy bag  Musculoskeletal: Normal range of motion  Skin no rashes or lesions  Neurologic no appreciable focal deficits  Data Reviewed: Basic Metabolic Panel: Recent Labs  Lab 01/09/19 0353 01/10/19 0538 01/11/19 0353 01/11/19 1500 01/12/19 0258 01/12/19 1714 01/13/19 0403 01/14/19 0439 01/15/19 0447  NA 131* 132* 134*  --  134*  --  136 136 134*  K 3.2* 3.4* 3.9  --  4.2  --  3.8 4.0 3.6  CL 103 103 106  --  105  --  105 103 103  CO2 22 22 22   --  22  --  25 26 25   GLUCOSE 108* 113* 117*  --  171*  --  116* 140* 118*  BUN 8 7* 7*  --  9  --  11 9 10   CREATININE 0.37* 0.49* 0.39*  --  0.35*  --  0.37* 0.46* 0.38*  CALCIUM 7.2* 7.4* 7.3*  --  7.7*  --  7.8* 8.1* 7.8*  MG 1.4* 1.6*  --  1.6*  --  2.0  --   --   --    Liver Function Tests: Recent Labs  Lab 01/09/19 1145 01/11/19 0353 01/12/19 0258 01/13/19 0403  AST 17 17 11* 18  ALT 40 28 23 26   ALKPHOS 142* 170* 158* 162*  BILITOT 1.1 0.6 0.9 0.7  PROT 4.6* 4.2* 4.5* 4.8*  ALBUMIN 1.6* 1.5* 1.8* 1.9*   No results for input(s): LIPASE, AMYLASE in the last 168 hours. No results for input(s): AMMONIA in the last 168 hours. CBC: Recent Labs  Lab 01/11/19 0353 01/12/19 0258 01/13/19 0403 01/14/19 0439 01/15/19 0447  WBC 1.3* 2.2* 2.8* 2.6* 2.7*  NEUTROABS 0.8* 1.7 2.0 1.8 1.9  HGB 7.8* 8.1* 8.4* 7.6* 7.7*  HCT 25.3* 26.4* 26.5* 24.5* 24.4*  MCV  95.8 97.4 97.8 98.0 97.6  PLT 41* 44* 51* 51* 54*   Cardiac Enzymes: No results for input(s): CKTOTAL, CKMB, CKMBINDEX, TROPONINI in the last 168 hours. BNP (last 3 results) No results for input(s): BNP in the last 8760 hours.  ProBNP (last 3 results) No results for input(s): PROBNP in the last 8760 hours.  CBG: Recent Labs  Lab 01/14/19 0739 01/14/19 1226 01/14/19 1603 01/14/19 2209 01/15/19 0735  GLUCAP 145* 229* 272* 158* 104*    No results found for this or any previous visit (from the past 240 hour(s)).   Studies: No results found.  Scheduled Meds: . B-complex with vitamin C  1 tablet Oral Daily  . cholestyramine light  4 g Oral 3 times per day  . dexamethasone  4 mg Oral Q breakfast  . dronabinol  5 mg Oral BID AC  . feeding supplement (ENSURE ENLIVE)  237 mL Oral BID BM  . feeding supplement (PRO-STAT SUGAR FREE 64)  30 mL Oral BID  . insulin aspart  0-5 Units Subcutaneous QHS  . insulin aspart  0-9 Units Subcutaneous TID WC  . zinc sulfate  220  mg Oral BID   Continuous Infusions:   Principal Problem:   Hyponatremia Active Problems:   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[facility])   Hypokalemia   Diarrhea   Severe protein-calorie malnutrition ([facility])   Pancytopenia ([facility])   Hematochezia   Radiation gastroenteritis   Radiation colitis   Malnutrition of moderate degree   Zinc deficiency   Pressure injury of skin   Other neutropenia (H[facility])   Hypomagnesemia   Neutropenia ([place])      Myrlene Broker D Nettey  [facility]

## 2019-01-15 NOTE — Progress Notes (Signed)
Nutrition Follow-up  DOCUMENTATION CODES:   Non-severe (moderate) malnutrition in context of chronic illness  INTERVENTION:   -Decrease Ensure Enlive to BID, each supplement provides 350 kcal and 20 grams of protein -Continue Prostat liquid protein PO 30 ml BID with meals, each supplement provides 100 kcal, 15 grams protein.  NUTRITION DIAGNOSIS:   Moderate Malnutrition related to chronic illness, catabolic illness, cancer and cancer related treatments as evidenced by mild fat depletion, mild muscle depletion, moderate muscle depletion.  Ongoing.  GOAL:   Patient will meet greater than or equal to 90% of their needs  Progressing.  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 He presented to the ED with complaints of diarrhea for 2-3 weeks and severe weakness. 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 GI, oncology following.  **RD working [employer] results: 5/1: Pt consumed 100% of [employer] meals. 5/2-5/3- info not available 5/4: Per RN, pt ate a biscuit and hashbrown from [employer] this morning. B: ~380 kcal, 6g protein L: 25% ~160 kcal, 7g protein  Total intake so far today: 540 kcal, 13g protein, not meeting estimated needs. Will need to consume supplements to help need nutritional needs.  Recommend measuring weight, last recorded 4/21.  Medications: B complex with vitamin C tablet daily, Marinol capsule BID, Zinc sulfate capsule BID Labs reviewed: CBGs: 104-158 Low Na  Diet Order:   Diet Order            Diet Carb Modified Fluid consistency: Thin; Room service appropriate? Yes  Diet effective now              EDUCATION NEEDS:   Not appropriate for education at  this time  Skin:  Skin Assessment: Skin Integrity Issues: Skin Integrity Issues:: Stage II Stage II: bilateral buttocks (new 4/23)  Last BM:  5/4  Height:   Ht Readings from Last 1 Encounters:  01/02/19 5' 9"  (1.753 m)    Weight:   Wt Readings from Last 1 Encounters:  01/02/19 86.3 kg    Ideal Body Weight:  72.73 kg  BMI:  Body mass index is 28.1 kg/m.  Estimated Nutritional Needs:   Kcal:  2400-2600 kcal  Protein:  120-130 grams  Fluid:  >/= 2.5 L/day  Clayton Bibles, MS, RD, LDN [place] [facility] Pager: [phone] After Hours Pager: [phone]

## 2019-01-16 DIAGNOSIS — R0602 Shortness of breath: Secondary | ICD-10-CM

## 2019-01-16 LAB — GLUCOSE, CAPILLARY
Glucose-Capillary: 142 mg/dL — ABNORMAL HIGH (ref 70–99)
Glucose-Capillary: 144 mg/dL — ABNORMAL HIGH (ref 70–99)
Glucose-Capillary: 92 mg/dL (ref 70–99)
Glucose-Capillary: 136 mg/dL — ABNORMAL HIGH (ref 70–99)
Glucose-Capillary: 183 mg/dL — ABNORMAL HIGH (ref 70–99)

## 2019-01-16 MED ORDER — ONDANSETRON HCL 4 MG PO TABS: 4.0000 mg | ORAL_TABLET | Freq: Four times a day (QID) | ORAL | 0 refills | Status: DC | PRN

## 2019-01-16 MED ORDER — ZINC SULFATE 220 (50 ZN) MG PO CAPS: 220.0000 mg | ORAL_CAPSULE | Freq: Two times a day (BID) | ORAL | 0 refills | Status: DC

## 2019-01-16 MED ORDER — PRO-STAT SUGAR FREE PO LIQD: 30.0000 mL | Freq: Two times a day (BID) | ORAL | 0 refills | Status: DC

## 2019-01-16 MED ORDER — DRONABINOL 5 MG PO CAPS: 5.0000 mg | ORAL_CAPSULE | Freq: Two times a day (BID) | ORAL | 0 refills | Status: DC

## 2019-01-16 MED ORDER — HEPARIN SOD (PORK) LOCK FLUSH 100 UNIT/ML IV SOLN
500.0000 [IU] | INTRAVENOUS | Status: AC | PRN
  Administered 2019-01-16: 500 [IU]

## 2019-01-16 MED ORDER — DEXAMETHASONE 4 MG PO TABS: 4.0000 mg | ORAL_TABLET | Freq: Every day | ORAL | 0 refills | Status: DC

## 2019-01-16 MED ORDER — CHOLESTYRAMINE LIGHT 4 G PO PACK: 4.0000 g | PACK | Freq: Three times a day (TID) | ORAL | 0 refills | Status: DC

## 2019-01-16 MED ORDER — ENSURE ENLIVE PO LIQD: 237.0000 mL | Freq: Two times a day (BID) | ORAL | 12 refills | Status: DC

## 2019-01-16 MED ORDER — B COMPLEX-C PO TABS: 1.0000 | ORAL_TABLET | Freq: Every day | ORAL | 0 refills | Status: DC

## 2019-01-16 MED ORDER — APIXABAN 5 MG PO TABS: ORAL_TABLET | ORAL | 3 refills | Status: DC

## 2019-01-16 NOTE — Discharge Summary (Signed)
Discharge Summary  Mark Frey WUX:[id number] DOB: 05-13-1956  PCP: [name], MD  Admit date: 01/01/2019 Discharge date: 01/17/2019   Time spent: < 25 minutes  Admitted From: home Disposition:  home  Recommendations for Outpatient Follow-up:  1. Follow up with PCP in 1 week, follow-up to be arranged with oncology 2. Patient will hold off on continued use of Eliquis until follow-up with oncology given recent neutropenia/pancytopenia 3.     Discharge Diagnoses:  Active Hospital Problems   Diagnosis Date Noted   Hyponatremia 01/01/2019   Hypomagnesemia 01/11/2019   Neutropenia (HCC) 01/11/2019   Other neutropenia (HCC)    Pressure injury of skin 01/07/2019   Zinc deficiency    Malnutrition of moderate degree 01/04/2019   Radiation gastroenteritis    Radiation colitis    Pancytopenia ([place]) 01/01/2019   Hematochezia    Severe protein-calorie malnutrition ([place]) 08/19/2018   Diarrhea 07/21/2018   Hypokalemia 07/21/2018   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[place]) 03/15/2018    Resolved Hospital Problems  No resolved problems to display.    Discharge Condition: Stable   CODE STATUS:FULL    History of present illness:  Mark Frey is a 63 y.o. 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 Remaining hospital course addressed in problem based format below:   Hospital Course:   Neutropenic fever in patient with b cell lymphoma and radiation colitis, improved. No fever since 4/25. Blood cultures with no growth.  Has completed cefepime and  Flagyl therapy.    Granix dosing by oncology improved his tachypnea with neutrophil count to remain greater than 1800 x 48 hours prior to discharge.  Patient will have close follow-up with oncology to monitor counts.    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  CT scan showed market thickening of the entire colon from cecum to rectum suggestive of radiation colitis, patient was started on short chain fatty acid enema but had to stop due to concern for neutropenic fever as mentioned above.  GI recommended cholestyramine for high ostomy output.  ESR and CRP have both down trended, infectious workup with C diff, GIP panel, CMV,  Parvovirus Ab negativeEBV all negative.  Has completed Flagyl therapy.  Diminished nutrition, improving significantly. Supplementations provided by nutrition. Based on oral intake from calorie count with added nutritional supplementation patient will not need TPN.Oncology increased Marinol to 5 mg p.o. twice daily to help with nausea and will continue low-dose dexamethasone to optimize his p.o. intake/improved bowel inflammation.  High grade B-cell lymphoma. Oncology following as outpatient, undergoing outpatient radiation therapy. 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  Will need close monitoring of CBC on follow-up.   HX of DVT/Pe.  Due to thrombocytopenia and presentation of GI bleed on Eliquis was held and patient was monitored on SCDs for DVT prophylaxis.  Per oncology will continue to hold Eliquis until follow-up/CBC check.  Hypovolemic hyponatremia, improved, previous nadir of 120 with improvement in oral nutrition and timed IV fluids  patient is able to return back to his baseline throughout hospital stay maintain normal sodium range of 131-134.    Hypokalemia and hypomagnesemia, Resolved with oral and IV supplementation rotation respectively.  Zinc deficiency.  Receiving zinc supplementation daily   Consultations:  GI, oncology  Procedures/Studies: None  Discharge Exam: BP 117/81 (BP Location: Left Arm)    Pulse (!) 107    Temp 98 F (36.7 C) (Oral)    Resp 19    Ht 5' 9" (1.753 m)    Wt 86.3 kg    SpO2 97%    BMI 28.10 kg/m    General: Obese male lying in bed, comfortably no distress  Cardiovascular: Regular rate and rhythm, no appreciable murmurs rubs or gallops, no peripheral edema  Respiratory: Normal respiratory effort on room air  Abdomen: Soft, nondistended, nontender, diminished bowel sounds, liquid stool in ostomy bag  Musculoskeletal: Normal range of motion  Skin no rashes or lesions  Neurologic no appreciable focal deficits   Discharge Instructions You were cared for by a hospitalist during your hospital stay. If you have any questions about your discharge medications or the care you received while you were in the hospital after you are discharged, you can call the unit and asked to speak with the hospitalist on call if the hospitalist that took care of you is not available. Once you are discharged, your primary care physician will handle any further medical issues. Please note that NO REFILLS for any discharge medications will be authorized once you are discharged, as it is imperative that you return to your primary care physician (or establish a relationship with a primary care physician if you do not have one) for your aftercare needs so that they can reassess your need for medications and monitor your lab values.  Discharge Instructions    Diet - low sodium heart healthy   Complete by:  As directed    Increase activity slowly   Complete by:  As directed      Allergies as of 01/16/2019       Reactions   Ciprofloxacin Other (See Comments)   Leg tingling      Medication List    STOP taking these medications   acetaminophen 325 MG tablet Commonly known as:  TYLENOL     TAKE these medications   apixaban 5 MG Tabs tablet Commonly known as:  Eliquis DO NOT TAKE UNTIL SEEN BY YOUR ONCOLOGIST What changed:    how much to take  how to take this  when to take this  additional instructions   B-complex with vitamin C tablet Take 1 tablet by mouth daily for 30 days.   blood glucose meter kit and supplies Dispense based on patient and insurance preference. Use daily as directed. (E11.9).   cholestyramine light 4 g packet Commonly known as:  PREVALITE Take 1 packet (4 g total) by mouth 3 (three) times daily.   dexamethasone 4 MG tablet Commonly known as:  DECADRON Take 1 tablet (4 mg total) by mouth daily with breakfast.   dronabinol 5 MG capsule Commonly known as:  MARINOL Take 1 capsule (5 mg total) by mouth 2 (two) times daily before lunch and supper.   feeding supplement (ENSURE ENLIVE) Liqd Take 237 mLs by mouth 2 (two) times daily between meals.   feeding supplement (PRO-STAT SUGAR FREE 64) Liqd Take 30 mLs by mouth 2 (two)  times daily.   glucose blood test strip Commonly known as:  FREESTYLE TEST STRIPS Use to check blood sugar daily   Janumet 50-1000 MG tablet Generic drug:  sitaGLIPtin-metformin TAKE 1 TABLET TWICE DAILY  WITH MEALS   lidocaine-prilocaine cream Commonly known as:  EMLA Apply 1 application topically as needed.   Magnesium 500 MG Caps Take 1 capsule (500 mg total) by mouth 2 (two) times daily. What changed:  when to take this   ondansetron 4 MG tablet Commonly known as:  ZOFRAN Take 1 tablet (4 mg total) by mouth every 6 (six) hours as needed for nausea.   zinc sulfate 220 (50 Zn) MG capsule Take 1 capsule (220 mg total) by mouth 2 (two) times daily for 30 days.      Allergies  Allergen Reactions   Ciprofloxacin  Other (See Comments)    Leg tingling   Follow-up Information    [facility].   Why:  [facility] will call you and arrange an appt Contact information: [address] [place], [place] [address] Office Phone:  [phone]           The results of significant diagnostics from this hospitalization (including imaging, microbiology, ancillary and laboratory) are listed below for reference.    Significant Diagnostic Studies: Dg Chest 2 View  Result Date: 01/01/2019 CLINICAL DATA:  Short of breath.  Recent chemotherapy for lymphoma EXAM: CHEST - 2 VIEW COMPARISON:  08/24/2018 FINDINGS: Heart size and vascularity normal. Mild linear densities in the right lung base are new and may be atelectasis. No pleural effusion. Port-A-Cath tip SVC. IMPRESSION: Mild right lower lobe atelectasis.  Negative for pneumonia. Electronically Signed   By: Franchot Gallo M.D.   On: 01/01/2019 19:38   Ct Abdomen Pelvis W Contrast  Result Date: 01/01/2019 CLINICAL DATA:  Diarrhea and weakness. History of Burkitt's lymphoma. Recently completed radiation. Ileostomy EXAM: CT ABDOMEN AND PELVIS WITH CONTRAST TECHNIQUE: Multidetector CT imaging of the abdomen and pelvis was performed using the standard protocol following bolus administration of intravenous contrast. CONTRAST:  164m OMNIPAQUE IOHEXOL 300 MG/ML  SOLN COMPARISON:  CT abdomen pelvis 10/05/2018 FINDINGS: Lower chest: Mild scarring in the lung bases. Negative for infiltrate or effusion. Hepatobiliary: Small subcentimeter hypodensities in the liver are unchanged and likely benign cysts. Hypodensity adjacent to the interhemispheric fissure unchanged compatible with fatty infiltration of the liver. Gallbladder contracted and mildly thickened. No biliary dilatation. Pancreas: Negative Spleen: Negative Adrenals/Urinary Tract: 5 mm right lower pole calculus and 2 mm right midpole calculus. No renal obstruction or mass. Negative bladder.  Stomach/Bowel: Ileostomy. Marked thickening of the entire colon from cecum to rectum with extensive stranding in the pericolonic fat. No free air or free fluid. Small bowel nondilated and non thickened. Vascular/Lymphatic: Minimal atherosclerotic disease in the aorta. Negative for lymphadenopathy. Reproductive: Moderate prostate enlargement with numerous calcifications. Other: Negative for abscess Musculoskeletal: Lumbar degenerative changes without acute skeletal abnormality. IMPRESSION: Findings compatible with severe colitis. Diffuse thickening of the colon with surrounding edema in the pericolonic fat. No abscess or free fluid. No small bowel obstruction. Ileostomy noted in the right lower quadrant. Negative for mass or adenopathy. Electronically Signed   By: CFranchot GalloM.D.   On: 01/01/2019 19:37    Microbiology: No results found for this or any previous visit (from the past 240 hour(s)).   Labs: Basic Metabolic Panel: Recent Labs  Lab 01/11/19 0353 01/11/19 1500 01/12/19 0258 01/12/19 1714 01/13/19 0403 01/14/19 0174905/04/20 04496  NA 134*  --  134*  --  136 136 134*  K 3.9  --  4.2  --  3.8 4.0 3.6  CL 106  --  105  --  105 103 103  CO2 22  --  22  --  _0 GLUCOSE 117*  --  171*  --  116* 140* 118*  BUN 7*  --  9  --  _1 CREATININE 0.39*  --  0.35*  --  0.37* 0.46* 0.38*  CALCIUM 7.3*  --  7.7*  --  7.8* 8.1* 7.8*  MG  --  1.6*  --  2.0  --   --   --    Liver Function Tests: Recent Labs  Lab 01/11/19 0353 01/12/19 0258 01/13/19 0403  AST 17 11* 18  ALT _2 ALKPHOS 170* 158* 162*  BILITOT 0.6 0.9 0.7  PROT 4.2* 4.5* 4.8*  ALBUMIN 1.5* 1.8* 1.9*   No results for input(s): LIPASE, AMYLASE in the last 168 hours. No results for input(s): AMMONIA in the last 168 hours. CBC: Recent Labs  Lab 01/11/19 0353 01/12/19 0258 01/13/19 0403 01/14/19 0439 01/15/19 0447  WBC 1.3* 2.2* 2.8* 2.6* 2.7*  NEUTROABS 0.8* 1.7 2.0 1.8 1.9  HGB 7.8* 8.1* 8.4*  7.6* 7.7*  HCT 25.3* 26.4* 26.5* 24.5* 24.4*  MCV 95.8 97.4 97.8 98.0 97.6  PLT 41* 44* 51* 51* 54*   Cardiac Enzymes: No results for input(s): CKTOTAL, CKMB, CKMBINDEX, TROPONINI in the last 168 hours. BNP: BNP (last 3 results) No results for input(s): BNP in the last 8760 hours.  ProBNP (last 3 results) No results for input(s): PROBNP in the last 8760 hours.  CBG: Recent Labs  Lab 01/15/19 1617 01/15/19 2239 01/16/19 0750 01/16/19 1151 01/16/19 1710  GLUCAP 194* 144* 92 136* 183*       Signed:  Desiree Hane, MD [facility] 01/17/2019, 2:01 PM

## 2019-01-16 NOTE — Progress Notes (Signed)
PT Cancellation Note  Patient Details Name: Mark Frey MRN: [id number] DOB: December 20, 1955   Cancelled Treatment:     pt declined this afternoon.  Did amb this morning with NT. Pt eager to get home.    Rica Koyanagi  PTA Acute  Rehabilitation Services Pager      [phone] Office      [phone]

## 2019-01-16 NOTE — Progress Notes (Signed)
Brief oncology note:  I stopped by to see Mark Frey this morning.  He is anxious to go home today.  I have reviewed the chart and see that they cannot get blood out of his Port-A-Cath this morning and that he refused to be stuck in his arm for a CBC.  I again discussed this with the patient and he declines this.  From an oncology standpoint, he may be discharged home today if medically stable and okay from a hospitalist standpoint.  We will arrange for outpatient follow-up for the patient and contact the patient and his wife with the date and time.  Mikey Bussing, DNP, AGPCNP-BC, AOCNP

## 2019-01-16 NOTE — Progress Notes (Signed)
Patient remains A&Ox4, ambulatory with 1 assist, walker. No change from am assessment. Discharge instructions reviewed with patient & spouse. Questions, concerns were denied.hard prescription placed into discharge packet.

## 2019-01-16 NOTE — Progress Notes (Signed)
Patient preferred pharmacy  [facility] [place], [place] [phone]

## 2019-01-16 NOTE — Progress Notes (Signed)
IV team nurse was unable to drawn labs from patient's port -a-cath this morning.  Phlebotomist up to stick patient for morning labs.  Pt refuses to allow her to stick him.  States he has a port, if they can't get it from there then ol' well".

## 2019-01-17 DIAGNOSIS — E119 Type 2 diabetes mellitus without complications: Secondary | ICD-10-CM | POA: Diagnosis not present

## 2019-01-17 DIAGNOSIS — C8510 Unspecified B-cell lymphoma, unspecified site: Secondary | ICD-10-CM | POA: Diagnosis not present

## 2019-01-17 DIAGNOSIS — Z86718 Personal history of other venous thrombosis and embolism: Secondary | ICD-10-CM | POA: Diagnosis not present

## 2019-01-17 DIAGNOSIS — D61818 Other pancytopenia: Secondary | ICD-10-CM | POA: Diagnosis not present

## 2019-01-17 DIAGNOSIS — K520 Gastroenteritis and colitis due to radiation: Secondary | ICD-10-CM | POA: Diagnosis not present

## 2019-01-17 DIAGNOSIS — Z86711 Personal history of pulmonary embolism: Secondary | ICD-10-CM | POA: Diagnosis not present

## 2019-01-17 DIAGNOSIS — Z5181 Encounter for therapeutic drug level monitoring: Secondary | ICD-10-CM | POA: Diagnosis not present

## 2019-01-17 DIAGNOSIS — K9049 Malabsorption due to intolerance, not elsewhere classified: Secondary | ICD-10-CM | POA: Diagnosis not present

## 2019-01-17 DIAGNOSIS — Z432 Encounter for attention to ileostomy: Secondary | ICD-10-CM | POA: Diagnosis not present

## 2019-01-18 ENCOUNTER — Telehealth: Payer: Self-pay | Admitting: Family Medicine

## 2019-01-18 ENCOUNTER — Telehealth: Payer: Self-pay | Admitting: *Deleted

## 2019-01-18 DIAGNOSIS — Z5181 Encounter for therapeutic drug level monitoring: Secondary | ICD-10-CM | POA: Diagnosis not present

## 2019-01-18 DIAGNOSIS — D61818 Other pancytopenia: Secondary | ICD-10-CM | POA: Diagnosis not present

## 2019-01-18 DIAGNOSIS — C8510 Unspecified B-cell lymphoma, unspecified site: Secondary | ICD-10-CM | POA: Diagnosis not present

## 2019-01-18 DIAGNOSIS — Z432 Encounter for attention to ileostomy: Secondary | ICD-10-CM | POA: Diagnosis not present

## 2019-01-18 DIAGNOSIS — M6281 Muscle weakness (generalized): Secondary | ICD-10-CM | POA: Diagnosis not present

## 2019-01-18 DIAGNOSIS — E119 Type 2 diabetes mellitus without complications: Secondary | ICD-10-CM | POA: Diagnosis not present

## 2019-01-18 DIAGNOSIS — K9049 Malabsorption due to intolerance, not elsewhere classified: Secondary | ICD-10-CM | POA: Diagnosis not present

## 2019-01-18 DIAGNOSIS — Z86718 Personal history of other venous thrombosis and embolism: Secondary | ICD-10-CM | POA: Diagnosis not present

## 2019-01-18 DIAGNOSIS — K520 Gastroenteritis and colitis due to radiation: Secondary | ICD-10-CM | POA: Diagnosis not present

## 2019-01-18 DIAGNOSIS — Z86711 Personal history of pulmonary embolism: Secondary | ICD-10-CM | POA: Diagnosis not present

## 2019-01-18 DIAGNOSIS — Z933 Colostomy status: Secondary | ICD-10-CM | POA: Diagnosis not present

## 2019-01-18 NOTE — Telephone Encounter (Signed)
Contacted by Eustaquio Maize, Nurse with [facility] to inform that they are providing care for patient at home following hospital d/c to coordinate care. Informed her [name] has ordered labs to be drawn next week 5/13 by home health.  Beth took verbal order for CBC/diff, CMP, Reticulocyte count and Sedimentation rate for 5/13

## 2019-01-18 NOTE — Telephone Encounter (Signed)
See note

## 2019-01-18 NOTE — Telephone Encounter (Signed)
I think he will need a face-to-face visit for verbal orders for home health-we can do that by video.  We could also use this to discuss other concerns in the other note

## 2019-01-18 NOTE — Telephone Encounter (Signed)
Copied from [place] [id number]. Topic: Quick Communication - See Telephone Encounter >> Jan 18, 2019  2:51 PM Blase Mess A wrote: CRM for notification. See Telephone encounter for: 01/18/19.  [name]  Beth from [facility] is calling to get Clarify on medication. Regarding Magnesium 500 MG CAPS [[id number]]  and JANUMET 50-1000 MG tablet [[id number]]  Wife indicates different dosages than what is listed on Hospital discharge papers. The patient was was having diarrhea can he take idiodium? Please advise CB- [phone] Thank you

## 2019-01-18 NOTE — Telephone Encounter (Signed)
Okay for verbal orders? 9 2 phone notes on pt but the same vo needing) Please advise

## 2019-01-18 NOTE — Telephone Encounter (Signed)
Copied from [place] [id number]. Topic: Quick Communication - Home Health Verbal Orders >> Jan 18, 2019  2:48 PM Luciana Axe wrote: Caller/Agency: Beth/[facility] Number: [phone] [facility] to leave Verbal on VM Requesting OT/PT/Skilled Nursing/Social Work/Speech Therapy: Home Health Orders Nursing Frequency: 1 week 1, 2 week 2

## 2019-01-18 NOTE — Telephone Encounter (Unsigned)
Copied from [place] [id number]. Topic: Quick Communication - Home Health Verbal Orders >> Jan 18, 2019 12:25 PM Yvette Rack wrote: Caller/Agency: Amy with [facility] Call[facility] Number: [phone] may leave orders on voicemail Requesting OT/PT/Skilled Nursing/Social Work/Speech Therapy: PT Frequency: 2 times a week for 3 weeks and 1 time a week every other week for 2 visits (8 total visits)

## 2019-01-19 ENCOUNTER — Telehealth: Payer: Self-pay | Admitting: Hematology

## 2019-01-19 ENCOUNTER — Encounter: Payer: Self-pay | Admitting: Family Medicine

## 2019-01-19 ENCOUNTER — Telehealth: Payer: Self-pay | Admitting: *Deleted

## 2019-01-19 ENCOUNTER — Ambulatory Visit (INDEPENDENT_AMBULATORY_CARE_PROVIDER_SITE_OTHER): Payer: BLUE CROSS/BLUE SHIELD | Admitting: Family Medicine

## 2019-01-19 VITALS — Ht 69.0 in | Wt 176.0 lb

## 2019-01-19 DIAGNOSIS — R531 Weakness: Secondary | ICD-10-CM | POA: Diagnosis not present

## 2019-01-19 DIAGNOSIS — C8510 Unspecified B-cell lymphoma, unspecified site: Secondary | ICD-10-CM

## 2019-01-19 DIAGNOSIS — K5100 Ulcerative (chronic) pancolitis without complications: Secondary | ICD-10-CM | POA: Diagnosis not present

## 2019-01-19 DIAGNOSIS — E119 Type 2 diabetes mellitus without complications: Secondary | ICD-10-CM

## 2019-01-19 DIAGNOSIS — I2699 Other pulmonary embolism without acute cor pulmonale: Secondary | ICD-10-CM

## 2019-01-19 NOTE — Assessment & Plan Note (Signed)
S: Months ago we had decreased Janumet 50-1000 mg to once daily-plan was for A1c follow-up hopefully as an add-on through oncology but we were unable to get this completed.  Wife reports blood sugars have been in the low 100s for the most part A/P: We will try to get home health A1c check in the next few weeks-for now with weight loss and appetite suppression I recommended patient stop the Janumet-perhaps if blood sugars start to consistently get above 180 we would reconsider or if A1c was above 8.  Hoping to see him back in office within 4 months and recheck an A1c at that time-if things are reasonably safe in regards to COVID-19 pandemic

## 2019-01-19 NOTE — Telephone Encounter (Signed)
Scheduled phone visit per sch msg. Called and spoke with patients wife. Confirmed date and time of phone call

## 2019-01-19 NOTE — Patient Instructions (Signed)
Health Maintenance Due  Topic Date Due  . PNEUMOCOCCAL POLYSACCHARIDE VACCINE AGE 63-64 HIGH RISK  07/15/1958  . OPHTHALMOLOGY EXAM  03/23/2016  . FOOT EXAM  01/31/2018  . URINE MICROALBUMIN  01/31/2018  . HEMOGLOBIN A1C  09/09/2018  Will discuss with Dr. Yong Channel during visit  Depression screen [facility] 2/9 05/22/2018 05/09/2017  Decreased Interest 0 0  Down, Depressed, Hopeless 0 0  PHQ - 2 Score 0 0  Some recent data might be hidden

## 2019-01-19 NOTE — Telephone Encounter (Signed)
Contacted patient per [name] to f/u post discharge. Advised that labs have been ordered through [facility] for next week and that [name] wants to see him in 2-3 weeks, depending on lab results from 5/13.

## 2019-01-19 NOTE — Telephone Encounter (Signed)
Please see message and advise.  Thank you. I informed Beth that pt has a visit today with Dr. Yong Channel.   Beth would like verbal order from Dr. Yong Channel after visit.

## 2019-01-19 NOTE — Telephone Encounter (Signed)
[name] wants to schedule phone visit with patient following labs on 5/13 instead of in person in 2 weeks.. Schedule mssg sent. Contacted wife to update. Wife verbalized understanding

## 2019-01-19 NOTE — Telephone Encounter (Signed)
See note

## 2019-01-19 NOTE — Telephone Encounter (Signed)
Please see other message.

## 2019-01-19 NOTE — Telephone Encounter (Signed)
Yes thanks- may provide verbal orders as requested  Can you also ask them to check a hemoglobin a1c under Controlled diabetes mellitus type II without complication please? With next bloodwork

## 2019-01-19 NOTE — Telephone Encounter (Signed)
[facility] is calling to check on the status of the verbal order below.Beth does not want the patient to miss any visits. Please advise thank you. Cb- [phone]

## 2019-01-19 NOTE — Progress Notes (Signed)
Phone [phone]   Subjective:  Virtual visit via Video note. Chief complaint: Chief Complaint  Patient presents with  . Hospitalization Follow-up    This visit type was conducted due to national recommendations for restrictions regarding the COVID-19 Pandemic (e.g. social distancing).  This format is felt to be most appropriate for this patient at this time balancing risks to patient and risks to population by having him in for in person visit.  No physical exam was performed (except for noted visual exam or audio findings with Telehealth visits).    Our team/I connected with Mark Frey on 01/19/19 at  3:00 PM EDT by a video enabled telemedicine application ([email or web address]) and verified that I am speaking with the correct person using two identifiers.  Location patient: [facility] Location provider: [facility], office Persons participating in the virtual visit:  patient, wife contributes to history  Our team/I discussed the limitations of evaluation and management by telemedicine and the availability of in person appointments. In light of current covid-19 pandemic, patient also understands that we are trying to protect them by minimizing in office contact if at all possible.  The patient expressed consent for telemedicine visit and agreed to proceed. Patient understands insurance will be billed.   ROS- no feer/chills. No cough or shortness of breath. No chest pain. Some feet swelling/ankle swelling but not up into leg- looks better today.    Past Medical History-  Patient Active Problem List   Diagnosis Date Noted  . 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 Bilateral pulmonary embolism ([place]) 03/10/2018    Priority: High  . Controlled diabetes mellitus type II without complication ([place]) 78/58/8502    Priority: High  . Morbid obesity ([place]) 12/29/2009    Priority: High  . Tachycardia 01/31/2017    Priority: Medium  . Hypertension 06/05/2014    Priority: Medium   . Irritable bowel syndrome 03/30/2010    Priority: Medium  . Hyperlipidemia 05/12/2007    Priority: Medium  . Dysmetabolic syndrome X 77/41/2878    Priority: Low  . BACK PAIN WITH RADICULOPATHY 06/09/2007    Priority: Low  . ALLERGIC RHINITIS 05/12/2007    Priority: Low  . Hypomagnesemia 01/11/2019  . Neutropenia ([place]) 01/11/2019  . Other neutropenia ([place])   . Pressure injury of skin 01/07/2019  . Zinc deficiency   . Malnutrition of moderate degree 01/04/2019  . Radiation gastroenteritis   . Radiation colitis   . Hyponatremia 01/01/2019  . Pancytopenia ([place]) 01/01/2019  . Hematochezia   . Abnormal CT of the abdomen   . Severe protein-calorie malnutrition ([place]) 08/19/2018  . 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 Diverticulitis of colon   . Low magnesium level 07/30/2018  . Hypokalemia 07/21/2018  . Bowel obstruction in setting of lymphoma and sigmoid stricture 07/21/2018  . Diarrhea 07/21/2018  . 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 Diabetes mellitus ([place])   . Edema   . Deep vein thrombosis (DVT) of femoral vein of right lower extremity ([place])   . Counseling regarding advance care planning and goals of care 04/25/2018  . Gastrointestinal hemorrhage   . Encounter for antineoplastic chemotherapy   . 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 Port-A-Cath in place 03/27/2018  . Acute deep vein thrombosis (DVT) of femoral vein of right lower extremity ([place])   . Anemia 03/10/2018  . Hypoglycemia 03/10/2018  . Occult blood in stools 03/10/2018  .  Abdominal mass 03/10/2018    Medications- reviewed and updated Current Outpatient Medications  Medication Sig Dispense Refill  . Amino Acids-Protein Hydrolys (FEEDING SUPPLEMENT, PRO-STAT SUGAR FREE 64,) LIQD Take 30 mLs by mouth 2 (two) times daily. 887 mL 0  . B Complex-C (B-COMPLEX WITH VITAMIN C) tablet Take 1 tablet by mouth daily for 30 days. 30 tablet 0  .  blood glucose meter kit and supplies Dispense based on patient and insurance preference. Use daily as directed. (E11.9). 1 each 0  . cholestyramine light (PREVALITE) 4 g packet Take 1 packet (4 g total) by mouth 3 (three) times daily. 90 packet 0  . dexamethasone (DECADRON) 4 MG tablet Take 1 tablet (4 mg total) by mouth daily with breakfast. 30 tablet 0  . dronabinol (MARINOL) 5 MG capsule Take 1 capsule (5 mg total) by mouth 2 (two) times daily before lunch and supper. 60 capsule 0  . feeding supplement, ENSURE ENLIVE, (ENSURE ENLIVE) LIQD Take 237 mLs by mouth 2 (two) times daily between meals. 237 mL 12  . glucose blood (FREESTYLE TEST STRIPS) test strip Use to check blood sugar daily 100 each 4  . JANUMET 50-1000 MG tablet TAKE 1 TABLET TWICE DAILY  WITH MEALS (Patient taking differently: Take 1 tablet by mouth daily. ) 180 tablet 3  . lidocaine-prilocaine (EMLA) cream Apply 1 application topically as needed. 30 g 2  . Magnesium 500 MG CAPS Take 1 capsule (500 mg total) by mouth 2 (two) times daily. (Patient taking differently: Take 500 mg by mouth 2 (two) times a day. ) 60 capsule 0  . ondansetron (ZOFRAN) 4 MG tablet Take 1 tablet (4 mg total) by mouth every 6 (six) hours as needed for nausea. 20 tablet 0  . zinc sulfate 220 (50 Zn) MG capsule Take 1 capsule (220 mg total) by mouth 2 (two) times daily for 30 days. 60 capsule 0  . apixaban (ELIQUIS) 5 MG TABS tablet DO NOT TAKE UNTIL SEEN BY YOUR ONCOLOGIST (Patient not taking: Reported on 01/19/2019) 60 tablet 3   No current facility-administered medications for this visit.      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  Ultimately with complications of neutropenic fever in patient with history of B-cell lymphoma as well as significant pancytopenia S: Patient was hospitalized from April 20 to May 6 after he was noted to have severe weakness after 2 to 3 weeks of diarrhea-found to have severe pancolitis.  During hospitalization patient developed neutropenic fever-he was fever free after April 25.  Blood culture showed no growth.  He completed cefepime and Flagyl therapy.  He was treated with Granix- tachypnea and neutrophil count improved-greater than 1800 prior to discharge.  Close follow-up with home health lab draws has been arranged by oncology.  Patient's radiation pancolitis which was discovered on CT scan from cecum to rectum led to increased ostomy output but ultimately resolved.  Short-term patient was treated with short-chain fatty acid enema but this was stopped due to neutropenic fever.  GI recommended cholestyramine for high ostomy output.  ESR and CRP trended down during hospitalization.  Infectious work-up was  negative with GI panel, CMV, C. difficile, parvovirus.  He was treated with Flagyl  As a result of significant illness patient developed marked malnutrition but improved during hospitalization.  Patient was treated with Marinol 5 mg twice daily to help with nausea and placed on low-dose dexamethasone to optimize p.o. intake and reduce bowel inflammation.  He did have significant hypovolemic hyponatremia at first but this returned with sodium getting up to 1 31-1 34 before discharge.  Hypokalemia and hypomagnesemia resolved with oral and IV supplementation.  The patient was being treated for high-grade B-cell lymphoma by oncology and radiology-outpatient follow-up was planned.  He did develop pancytopenia which was thought to be multifactorial.  He did have a GI bleed related to the radiation colitis-his blood thinner for his history of DVT and PE had to be stopped as a result.   Patient did require a blood transfusion in order to keep hemoglobin above 7.5.  He has outpatient CBC arranged through oncology he reports.  As noted above- Eliquis is on hold until repeat evaluation with CBC and reevaluation of platelets.  Patient reports slowly recovering-dealing with a lot of fatigue and generalized weakness.  Mild edema at his ankles at times-does not seem to be progressive A/P:  Severe pancolitis with significant electrolyte abnormalities- this improved with supportive care such as IV hydration, dexamethasone.  For decreased appetite- patient is on Marinol.  For his malnutrition- he is trying to drink protein drinks daily.  His weight is down from 282 in 2016 to now 176.  For generalized weakness- patient is planning PT/OT through home health- patient needed a face-to-face visit to support orders and I have provided verbal orders today for the staff to communicate with his home health agency.  B-cell lymphoma history/pancytopenia-patient has close follow-up with oncology.  He will have labs in the coming week.  Per patient and wife- no further treatments planned at this time-may be transitioning care back towards me as he continues to recover  History of DVT/bilateral PE now off Xarelto- hopeful pancytopenia resolves or at least improves and Xarelto can be reconsidered by oncology.  Patient states he would decline SCDs at home-considered these.  He is getting up to walk at least once an hour and I do think this will be beneficial.   Controlled diabetes mellitus type II without complication ([place]) S: Months ago we had decreased Janumet 50-1000 mg to once daily-plan was for A1c follow-up hopefully as an add-on through oncology but we were unable to get this completed.  Wife reports blood sugars have been in the low 100s for the most part A/P: We will try to get home health A1c check in the next few weeks-for now with weight loss and appetite suppression I recommended patient stop  the Janumet-perhaps if blood sugars start to consistently get above 180 we would reconsider or if A1c was above 8.  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  Garret Reddish, MD

## 2019-01-19 NOTE — Telephone Encounter (Signed)
Patient agreed to VV.  He is on schedule for today.

## 2019-01-22 ENCOUNTER — Telehealth: Payer: Self-pay | Admitting: Family Medicine

## 2019-01-22 DIAGNOSIS — Z5181 Encounter for therapeutic drug level monitoring: Secondary | ICD-10-CM | POA: Diagnosis not present

## 2019-01-22 DIAGNOSIS — Z432 Encounter for attention to ileostomy: Secondary | ICD-10-CM | POA: Diagnosis not present

## 2019-01-22 DIAGNOSIS — K520 Gastroenteritis and colitis due to radiation: Secondary | ICD-10-CM | POA: Diagnosis not present

## 2019-01-22 DIAGNOSIS — E119 Type 2 diabetes mellitus without complications: Secondary | ICD-10-CM | POA: Diagnosis not present

## 2019-01-22 DIAGNOSIS — Z86718 Personal history of other venous thrombosis and embolism: Secondary | ICD-10-CM | POA: Diagnosis not present

## 2019-01-22 DIAGNOSIS — C8510 Unspecified B-cell lymphoma, unspecified site: Secondary | ICD-10-CM | POA: Diagnosis not present

## 2019-01-22 DIAGNOSIS — Z86711 Personal history of pulmonary embolism: Secondary | ICD-10-CM | POA: Diagnosis not present

## 2019-01-22 DIAGNOSIS — D61818 Other pancytopenia: Secondary | ICD-10-CM | POA: Diagnosis not present

## 2019-01-22 DIAGNOSIS — K9049 Malabsorption due to intolerance, not elsewhere classified: Secondary | ICD-10-CM | POA: Diagnosis not present

## 2019-01-22 NOTE — Telephone Encounter (Signed)
Called and gave OK for verbal order for home health nursing visits.  Also asked about HgA1C and Amy said that should be fine but would check to be sure.  Informed her that Dr. Yong Channel would like A1C tested in the next few weeks by home health if possible.

## 2019-01-22 NOTE — Telephone Encounter (Signed)
Copied from [place] [id number]. Topic: General - Other >> Jan 22, 2019  2:16 PM Leward Quan A wrote: Reason for CRM: Beth with [facility] called and would like to draw blood work for Magnesium asking can it be added to orders that are to be taken on 01/24/2019. Also would like to know how often Magnesium 500 MG CAPS is to be taken orders from hospital read twice but patient is only taking it once a day. Please call Beth at Ph# [phone]

## 2019-01-22 NOTE — Telephone Encounter (Signed)
Left message to return phone call.

## 2019-01-22 NOTE — Telephone Encounter (Signed)
See note

## 2019-01-22 NOTE — Telephone Encounter (Signed)
Amy from [facility] is calling in requesting the verbal OK to start seeing patient. Call back is [phone]. It is okay to leave voicemail.

## 2019-01-22 NOTE — Telephone Encounter (Signed)
Called and gave ok for verbal order for home health nursing visits.

## 2019-01-22 NOTE — Telephone Encounter (Signed)
Missed a call from [facility]. Tried to return call but no answer. Lm to return phone call.

## 2019-01-23 DIAGNOSIS — C8510 Unspecified B-cell lymphoma, unspecified site: Secondary | ICD-10-CM | POA: Diagnosis not present

## 2019-01-23 DIAGNOSIS — Z86711 Personal history of pulmonary embolism: Secondary | ICD-10-CM | POA: Diagnosis not present

## 2019-01-23 DIAGNOSIS — E119 Type 2 diabetes mellitus without complications: Secondary | ICD-10-CM | POA: Diagnosis not present

## 2019-01-23 DIAGNOSIS — Z86718 Personal history of other venous thrombosis and embolism: Secondary | ICD-10-CM | POA: Diagnosis not present

## 2019-01-23 DIAGNOSIS — D61818 Other pancytopenia: Secondary | ICD-10-CM | POA: Diagnosis not present

## 2019-01-23 DIAGNOSIS — K9049 Malabsorption due to intolerance, not elsewhere classified: Secondary | ICD-10-CM | POA: Diagnosis not present

## 2019-01-23 DIAGNOSIS — K520 Gastroenteritis and colitis due to radiation: Secondary | ICD-10-CM | POA: Diagnosis not present

## 2019-01-23 DIAGNOSIS — Z432 Encounter for attention to ileostomy: Secondary | ICD-10-CM | POA: Diagnosis not present

## 2019-01-23 DIAGNOSIS — Z5181 Encounter for therapeutic drug level monitoring: Secondary | ICD-10-CM | POA: Diagnosis not present

## 2019-01-23 NOTE — Telephone Encounter (Signed)
Left message to return phone call.

## 2019-01-23 NOTE — Telephone Encounter (Signed)
Yes can add labs- I am ok with him continuing magnesium once a day and then [place] checking the lab

## 2019-01-23 NOTE — Telephone Encounter (Signed)
Medication dosage question answered. Is it okay to add labs?   Please advise

## 2019-01-24 ENCOUNTER — Other Ambulatory Visit: Payer: Self-pay | Admitting: *Deleted

## 2019-01-24 ENCOUNTER — Telehealth: Payer: Self-pay | Admitting: *Deleted

## 2019-01-24 DIAGNOSIS — K520 Gastroenteritis and colitis due to radiation: Secondary | ICD-10-CM | POA: Diagnosis not present

## 2019-01-24 DIAGNOSIS — Z86711 Personal history of pulmonary embolism: Secondary | ICD-10-CM | POA: Diagnosis not present

## 2019-01-24 DIAGNOSIS — Z86718 Personal history of other venous thrombosis and embolism: Secondary | ICD-10-CM | POA: Diagnosis not present

## 2019-01-24 DIAGNOSIS — Z5181 Encounter for therapeutic drug level monitoring: Secondary | ICD-10-CM | POA: Diagnosis not present

## 2019-01-24 DIAGNOSIS — Z432 Encounter for attention to ileostomy: Secondary | ICD-10-CM | POA: Diagnosis not present

## 2019-01-24 DIAGNOSIS — E119 Type 2 diabetes mellitus without complications: Secondary | ICD-10-CM | POA: Diagnosis not present

## 2019-01-24 DIAGNOSIS — K9049 Malabsorption due to intolerance, not elsewhere classified: Secondary | ICD-10-CM | POA: Diagnosis not present

## 2019-01-24 DIAGNOSIS — D61818 Other pancytopenia: Secondary | ICD-10-CM | POA: Diagnosis not present

## 2019-01-24 DIAGNOSIS — C8378 Burkitt lymphoma, lymph nodes of multiple sites: Secondary | ICD-10-CM

## 2019-01-24 DIAGNOSIS — C8510 Unspecified B-cell lymphoma, unspecified site: Secondary | ICD-10-CM | POA: Diagnosis not present

## 2019-01-24 NOTE — Telephone Encounter (Signed)
I left a detailed message for Mark Frey to call office back.

## 2019-01-24 NOTE — Telephone Encounter (Signed)
Mark Frey,  Nurse with [facility] called - unable to draw requested labs peripherally. Patient contacted. Spoke with wife, Mark Frey. Informed that labs would need to be completed here at [facility]. She verbalized understanding and said they could be here at 10:15 in the morning. Sent schedule msg for lab appt at [facility] at 10:15 am on 5/14.

## 2019-01-25 ENCOUNTER — Other Ambulatory Visit: Payer: Self-pay | Admitting: *Deleted

## 2019-01-25 ENCOUNTER — Inpatient Hospital Stay: Payer: BC Managed Care – PPO | Attending: Radiation Oncology

## 2019-01-25 ENCOUNTER — Telehealth: Payer: Self-pay | Admitting: Family Medicine

## 2019-01-25 ENCOUNTER — Inpatient Hospital Stay: Payer: BC Managed Care – PPO

## 2019-01-25 ENCOUNTER — Other Ambulatory Visit: Payer: Self-pay

## 2019-01-25 ENCOUNTER — Other Ambulatory Visit: Payer: Self-pay | Admitting: Family Medicine

## 2019-01-25 DIAGNOSIS — R790 Abnormal level of blood mineral: Secondary | ICD-10-CM

## 2019-01-25 DIAGNOSIS — C8378 Burkitt lymphoma, lymph nodes of multiple sites: Secondary | ICD-10-CM | POA: Diagnosis not present

## 2019-01-25 DIAGNOSIS — Z95828 Presence of other vascular implants and grafts: Secondary | ICD-10-CM

## 2019-01-25 DIAGNOSIS — C8510 Unspecified B-cell lymphoma, unspecified site: Secondary | ICD-10-CM

## 2019-01-25 DIAGNOSIS — Z79899 Other long term (current) drug therapy: Secondary | ICD-10-CM | POA: Insufficient documentation

## 2019-01-25 DIAGNOSIS — E119 Type 2 diabetes mellitus without complications: Secondary | ICD-10-CM | POA: Insufficient documentation

## 2019-01-25 DIAGNOSIS — K520 Gastroenteritis and colitis due to radiation: Secondary | ICD-10-CM | POA: Insufficient documentation

## 2019-01-25 DIAGNOSIS — Z7189 Other specified counseling: Secondary | ICD-10-CM

## 2019-01-25 DIAGNOSIS — E440 Moderate protein-calorie malnutrition: Secondary | ICD-10-CM | POA: Diagnosis not present

## 2019-01-25 LAB — RETICULOCYTES
Immature Retic Fract: 26.8 % — ABNORMAL HIGH (ref 2.3–15.9)
RBC.: 2.86 MIL/uL — ABNORMAL LOW (ref 4.22–5.81)
Retic Count, Absolute: 88.7 10*3/uL (ref 19.0–186.0)
Retic Ct Pct: 3.1 % (ref 0.4–3.1)

## 2019-01-25 LAB — CBC WITH DIFFERENTIAL (CANCER CENTER ONLY)
Abs Immature Granulocytes: 0.39 10*3/uL — ABNORMAL HIGH (ref 0.00–0.07)
Basophils Absolute: 0 10*3/uL (ref 0.0–0.1)
Basophils Relative: 0 %
Eosinophils Absolute: 0 10*3/uL (ref 0.0–0.5)
Eosinophils Relative: 0 %
HCT: 27.1 % — ABNORMAL LOW (ref 39.0–52.0)
Hemoglobin: 8.7 g/dL — ABNORMAL LOW (ref 13.0–17.0)
Immature Granulocytes: 6 %
Lymphocytes Relative: 3 %
Lymphs Abs: 0.2 10*3/uL — ABNORMAL LOW (ref 0.7–4.0)
MCH: 30 pg (ref 26.0–34.0)
MCHC: 32.1 g/dL (ref 30.0–36.0)
MCV: 93.4 fL (ref 80.0–100.0)
Monocytes Absolute: 0.7 10*3/uL (ref 0.1–1.0)
Monocytes Relative: 10 %
Neutro Abs: 5.5 10*3/uL (ref 1.7–7.7)
Neutrophils Relative %: 81 %
Platelet Count: 139 10*3/uL — ABNORMAL LOW (ref 150–400)
RBC: 2.9 MIL/uL — ABNORMAL LOW (ref 4.22–5.81)
RDW: 19 % — ABNORMAL HIGH (ref 11.5–15.5)
WBC Count: 6.8 10*3/uL (ref 4.0–10.5)
nRBC: 0 % (ref 0.0–0.2)

## 2019-01-25 LAB — CMP (CANCER CENTER ONLY)
ALT: 59 U/L — ABNORMAL HIGH (ref 0–44)
AST: 18 U/L (ref 15–41)
Albumin: 2.4 g/dL — ABNORMAL LOW (ref 3.5–5.0)
Alkaline Phosphatase: 162 U/L — ABNORMAL HIGH (ref 38–126)
Anion gap: 14 (ref 5–15)
BUN: 17 mg/dL (ref 8–23)
CO2: 23 mmol/L (ref 22–32)
Calcium: 9.7 mg/dL (ref 8.9–10.3)
Chloride: 94 mmol/L — ABNORMAL LOW (ref 98–111)
Creatinine: 0.78 mg/dL (ref 0.61–1.24)
GFR, Est AFR Am: >60 mL/min (ref >60)
GFR, Estimated: >60 mL/min (ref >60)
Glucose, Bld: 257 mg/dL — ABNORMAL HIGH (ref 70–99)
Potassium: 4.1 mmol/L (ref 3.5–5.1)
Sodium: 131 mmol/L — ABNORMAL LOW (ref 135–145)
Total Bilirubin: 0.6 mg/dL (ref 0.3–1.2)
Total Protein: 6.5 g/dL (ref 6.5–8.1)

## 2019-01-25 LAB — SEDIMENTATION RATE: Sed Rate: 135 mm/hr — ABNORMAL HIGH (ref 0–16)

## 2019-01-25 MED ORDER — SODIUM CHLORIDE 0.9% FLUSH
10.0000 mL | Freq: Once | INTRAVENOUS | Status: AC
  Administered 2019-01-25: 10 mL
  Filled 2019-01-25: qty 10

## 2019-01-25 MED ORDER — HEPARIN SOD (PORK) LOCK FLUSH 100 UNIT/ML IV SOLN
500.0000 [IU] | Freq: Once | INTRAVENOUS | Status: AC
  Administered 2019-01-25: 11:00:00 500 [IU]
  Filled 2019-01-25: qty 5

## 2019-01-25 NOTE — Progress Notes (Signed)
HEMATOLOGY/ONCOLOGY CLINIC NOTE  Date of Service: 01/26/19    Patient Care Team: [name], MD as PCP - General (Family Medicine)  CHIEF COMPLAINTS/PURPOSE OF CONSULTATION:  F/u for continued Mx of Burkitts lymphoma  HISTORY OF PRESENTING ILLNESS:   Mark Frey is a wonderful 63 y.o. male who has been referred to [place] by my colleague Dr. 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 He is accompanied today by his wife. The pt reports that he is doing well overall.   The pt started R-EPOCH every 3 weeks with neulasta support on 03/17/18. He presented to the ED with right leg swelling, found to be a DVT and bilateral PE, which resulted in the incidental finding of a lower abdomen tumor measuring 18.7 x 18.5 x 17.8 cm. He denies any abdominal pains or changes in his bowel habits prior to this finding. He first noted blood in the stools 4-5 days prior to appearing to the ED.  The pt reports that he has been taking 130m Lovenox injections once each day, except for the days in which he had blood in the stools. He hasn't had blood in his stools for the last 7 days.   He has had GI bleed related to bowel involvement with his lymphoma - this is now resolving and his hgb is stabilizing.  He notes that his scrotum and leg swelling has decreased. He denies having CP or SOB at any point from his PE.   Most recent lab results (04/06/18) of CBC w/diff, CMP, Reticulocytes  is as follows: all values are WNL except for WBC at 15.3k, RBC at 3.65, HGB at 10.5, HCT at 31.4, RDW at 17.8, PLT at 640k, ANC at 12.7k, Monocytes abs at 1.6k, Glucose at 162, Total Protein at 6.2, Albumin at 3.1, Total bilirubin at <0.2, Retic ct pct at 2.5%. Uric acid 04/06/18 was low at 3.0 LDH 04/06/18 elevated at 251  On review of systems, pt reports remaining right leg swelling, blood in the stools 7 days ago, two  bowel movements each day, left leg swelling, improved scrotal swelling, eating well, and denies problems with his port, abdominal pains, constipation, diarrhea, jaw pain, pain along the spine, problems passing urine, and any other symptoms.  Interval History:   I connected with DAneta Minson _0 @ at  9:20 AM EDT by telephone and verified that I am speaking with the correct person using two identifiers.  I discussed the limitations, risks, security and privacy concerns of performing an evaluation and management service by telemedicine and the availability of in-person appointments. I also discussed with the patient that there may be a patient responsible charge related to this service. The patient expressed understanding and agreed to proceed.   Other persons participating in the visit and their role in the encounter: his wife, [name]  Patient's location: his home Provider's location: [facility] called today via a Virtual Encounter for management and evaluation of his Burkitt's lymphoma. I last saw the pt on 01/12/19 as an inpatient during his care for his recent radiation related pancolitis. The pt reports that he is doing well overall.  The pt reports that he is happy to be home and has been feeling progressively better and stronger each day. Home care is coming in and he is getting PT at home as well.  He notes that he has been able to get up and about with a walker in his home. He notes that his appetite has improved a little and has been eating more. He is consuming 1 protein shake each day in addition to his small meals. He is continuing on 79m Dexamethasone each morning, and Marinol BID. His wife endorses some moments of confusion during the day. The pt notes that he is continuing to have some clear, mucous from his rectum without blood and without stool material as such. He notes that he was not able to tolerate Cholestyramine and had to  stop this a few days ago. The pt's wife notes that he is having normalized bowel movements with some more form now through his ileostomy. He denies having any pain as such in general.  Lab results (01/25/19) of CBC w/diff and CMP is as follows: all values are WNL except for RBC at 2.90, HGB at 8.7, HCT at 27.1, RDW at 19.0, PLT at 139k, Lymphs abs at 200, Sodium at 131, Chloride at 94, Glucose at 257, Albumin at 2.4, ALT at 59, Alk Phos at 162. 01/25/19 Sed Rate at 135  On review of systems, pt reports eating better, improved appetite, mucous moving from rectum, slowly improving activity, normalized appearance of stools in ileostomy, and denies stools from rectum, abdominal pain, and any other symptoms.   MEDICAL HISTORY:  Past Medical History:  Diagnosis Date   ALLERGIC RHINITIS    Cancer ([place]    Lymphoma    Diabetes mellitus    Hyperlipidemia     SURGICAL HISTORY: Past Surgical History:  Procedure Laterality Date   BIOPSY  03/15/2018   Procedure: BIOPSY;  Surgeon: JMilus Banister MD;  Location: [facility];  Service: Endoscopy;;   BOWEL RESECTION N/A 08/25/2018   Procedure: LAPROSCOPIC LOOP ILEOSTOMY;  Surgeon: WGreer Pickerel MD;  [facility];  Service: General;  Laterality: N/A;   CLEFT PALATE REPAIR     COLONOSCOPY N/A 03/15/2018   Procedure: COLONOSCOPY;  Surgeon: JMilus Banister MD;  [facility];  Service: Endoscopy;  Laterality: N/A;   COLONOSCOPY N/A 08/20/2018   Procedure: COLONOSCOPY;  Surgeon: PIrene Shipper MD;  Location: [facility];  Service: Endoscopy;  Laterality: N/A;   deviated septum repair     slight improvement   ESOPHAGOGASTRODUODENOSCOPY N/A 03/15/2018   Procedure: ESOPHAGOGASTRODUODENOSCOPY (EGD);  Surgeon: JMilus Banister MD;  [facility];  Service: Endoscopy;  Laterality: N/A;   IR IMAGING GUIDED PORT INSERTION  03/17/2018   LAPAROSCOPY N/A 08/25/2018   Procedure: LAPAROSCOPY DIAGNOSTIC;  Surgeon: WGreer Pickerel MD;   Location: [facility];  Service: General;  Laterality: N/A;   TONSILLECTOMY      SOCIAL HISTORY: Social History   Socioeconomic History   Marital status: Married    Spouse name: lisa   Number of children: 0   Years of education: Not on file   Highest education level: Not on file  Occupational History   Not on file  Social Needs   Financial resource strain: Not hard at all   Food insecurity:    Worry: Never true    Inability: Never true   Transportation needs:    Medical: No    Non-medical: No  Tobacco Use   Smoking status: Never Smoker   Smokeless tobacco: Never Used  Substance and Sexual Activity   Alcohol use: Yes    Comment: occasional   Drug use: No   Sexual activity: Yes  Lifestyle  Physical activity:    Days per week: 0 days    Minutes per session: 0 min   Stress: Not at all  Relationships   Social connections:    Talks on phone: More than three times a week    Gets together: More than three times a week    Attends religious service: 1 to 4 times per year    Active member of club or organization: No    Attends meetings of clubs or organizations: Never    Relationship status: Married   Intimate partner violence:    Fear of current or ex partner: No    Emotionally abused: No    Physically abused: No    Forced sexual activity: No  Other Topics Concern   Not on file  Social History Narrative   Married 1985. No kids. 4 small dogs.       Works in Financial trader, residential      Hobbies: work on cars, Haematologist, exercise as able    FAMILY HISTORY: Family History  Problem Relation Age of Onset   Lung cancer Mother        smoker   Brain cancer Mother        metastasis   AAA (abdominal aortic aneurysm) Father        smoker    ALLERGIES:  is allergic to ciprofloxacin.  MEDICATIONS:  Current Outpatient Medications  Medication Sig Dispense Refill   Amino Acids-Protein Hydrolys (FEEDING SUPPLEMENT, PRO-STAT SUGAR FREE 64,)  LIQD Take 30 mLs by mouth 2 (two) times daily. 887 mL 0   apixaban (ELIQUIS) 5 MG TABS tablet DO NOT TAKE UNTIL SEEN BY YOUR ONCOLOGIST (Patient not taking: Reported on 01/19/2019) 60 tablet 3   B Complex-C (B-COMPLEX WITH VITAMIN C) tablet Take 1 tablet by mouth daily for 30 days. 30 tablet 0   blood glucose meter kit and supplies Dispense based on patient and insurance preference. Use daily as directed. (E11.9). 1 each 0   cholestyramine light (PREVALITE) 4 g packet Take 1 packet (4 g total) by mouth 3 (three) times daily. 90 packet 0   dexamethasone (DECADRON) 4 MG tablet Take 1 tablet (4 mg total) by mouth daily with breakfast. 30 tablet 0   dronabinol (MARINOL) 5 MG capsule Take 1 capsule (5 mg total) by mouth 2 (two) times daily before lunch and supper. 60 capsule 0   feeding supplement, ENSURE ENLIVE, (ENSURE ENLIVE) LIQD Take 237 mLs by mouth 2 (two) times daily between meals. 237 mL 12   glucose blood (FREESTYLE TEST STRIPS) test strip Use to check blood sugar daily 100 each 4   JANUMET 50-1000 MG tablet TAKE 1 TABLET TWICE DAILY  WITH MEALS (Patient taking differently: Take 1 tablet by mouth daily. ) 180 tablet 3   lidocaine-prilocaine (EMLA) cream Apply 1 application topically as needed. 30 g 2   Magnesium 500 MG CAPS Take 1 capsule (500 mg total) by mouth 2 (two) times daily. (Patient taking differently: Take 500 mg by mouth 2 (two) times a day. ) 60 capsule 0   ondansetron (ZOFRAN) 4 MG tablet Take 1 tablet (4 mg total) by mouth every 6 (six) hours as needed for nausea. 20 tablet 0   zinc sulfate 220 (50 Zn) MG capsule Take 1 capsule (220 mg total) by mouth 2 (two) times daily for 30 days. 60 capsule 0   No current facility-administered medications for this visit.     REVIEW OF SYSTEMS:    A 10+  POINT REVIEW OF SYSTEMS WAS OBTAINED including neurology, dermatology, psychiatry, cardiac, respiratory, lymph, extremities, GI, GU, Musculoskeletal, constitutional, breasts,  reproductive, HEENT.  All pertinent positives are noted in the HPI.  All others are negative.   PHYSICAL EXAMINATION: ECOG PERFORMANCE STATUS: 1 - Symptomatic but completely ambulatory  There were no vitals filed for this visit. There were no vitals filed for this visit. .There is no height or weight on file to calculate BMI.  Phone Visit   LABORATORY DATA:   CBC Latest Ref Rng & Units 01/25/2019 01/15/2019 01/14/2019  WBC 4.0 - 10.5 K/uL 6.8 2.7(L) 2.6(L)  Hemoglobin 13.0 - 17.0 g/dL 8.7(L) 7.7(L) 7.6(L)  Hematocrit 39.0 - 52.0 % 27.1(L) 24.4(L) 24.5(L)  Platelets 150 - 400 K/uL 139(L) 54(L) 51(L)    CMP Latest Ref Rng & Units 01/25/2019 01/15/2019 01/14/2019  Glucose 70 - 99 mg/dL 257(H) 118(H) 140(H)  BUN 8 - 23 mg/dL _0 Creatinine 0.61 - 1.24 mg/dL 0.78 0.38(L) 0.46(L)  Sodium 135 - 145 mmol/L 131(L) 134(L) 136  Potassium 3.5 - 5.1 mmol/L 4.1 3.6 4.0  Chloride 98 - 111 mmol/L 94(L) 103 103  CO2 22 - 32 mmol/L _1 Calcium 8.9 - 10.3 mg/dL 9.7 7.8(L) 8.1(L)  Total Protein 6.5 - 8.1 g/dL 6.5 - -  Total Bilirubin 0.3 - 1.2 mg/dL 0.6 - -  Alkaline Phos 38 - 126 U/L 162(H) - -  AST 15 - 41 U/L 18 - -  ALT 0 - 44 U/L 59(H) - -   Lab Results  Component Value Date   LDH 81 (L) 01/02/2019    03/17/18 Cytogenetics:   03/17/18 Colon Bx:   03/13/18 Peritoneum Biopsy:      RADIOGRAPHIC STUDIES:  I have personally reviewed the radiological images as listed and agreed with the findings in the report. Dg Chest 2 View  Result Date: 01/01/2019 CLINICAL DATA:  Short of breath.  Recent chemotherapy for lymphoma EXAM: CHEST - 2 VIEW COMPARISON:  08/24/2018 FINDINGS: Heart size and vascularity normal. Mild linear densities in the right lung base are new and may be atelectasis. No pleural effusion. Port-A-Cath tip SVC. IMPRESSION: Mild right lower lobe atelectasis.  Negative for pneumonia. Electronically Signed   By: Franchot Gallo M.D.   On: 01/01/2019 19:38   Ct Abdomen Pelvis W  Contrast  Result Date: 01/01/2019 CLINICAL DATA:  Diarrhea and weakness. History of Burkitt's lymphoma. Recently completed radiation. Ileostomy EXAM: CT ABDOMEN AND PELVIS WITH CONTRAST TECHNIQUE: Multidetector CT imaging of the abdomen and pelvis was performed using the standard protocol following bolus administration of intravenous contrast. CONTRAST:  130m OMNIPAQUE IOHEXOL 300 MG/ML  SOLN COMPARISON:  CT abdomen pelvis 10/05/2018 FINDINGS: Lower chest: Mild scarring in the lung bases. Negative for infiltrate or effusion. Hepatobiliary: Small subcentimeter hypodensities in the liver are unchanged and likely benign cysts. Hypodensity adjacent to the interhemispheric fissure unchanged compatible with fatty infiltration of the liver. Gallbladder contracted and mildly thickened. No biliary dilatation. Pancreas: Negative Spleen: Negative Adrenals/Urinary Tract: 5 mm right lower pole calculus and 2 mm right midpole calculus. No renal obstruction or mass. Negative bladder. Stomach/Bowel: Ileostomy. Marked thickening of the entire colon from cecum to rectum with extensive stranding in the pericolonic fat. No free air or free fluid. Small bowel nondilated and non thickened. Vascular/Lymphatic: Minimal atherosclerotic disease in the aorta. Negative for lymphadenopathy. Reproductive: Moderate prostate enlargement with numerous calcifications. Other: Negative for abscess Musculoskeletal: Lumbar degenerative changes without acute skeletal abnormality. IMPRESSION: Findings compatible  with severe colitis. Diffuse thickening of the colon with surrounding edema in the pericolonic fat. No abscess or free fluid. No small bowel obstruction. Ileostomy noted in the right lower quadrant. Negative for mass or adenopathy. Electronically Signed   By: Franchot Gallo M.D.   On: 01/01/2019 19:37    ASSESSMENT & PLAN:   63 y.o. male with  1. 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 No BM or CNS involvement.   04/18/18 PET/CT revealed Continued improvement in right colonic wall thickening and surrounding bulky masses consistent with treated lymphoma. No hypermetabolic activity to suggest residual tumor. There is some hypermetabolic activity associated with the sigmoid colon which demonstrates mild wall thickening, surrounding inflammation and underlying diverticulosis, suggesting mild diverticulitis. Suspected treatment related changes throughout the bone marrow. There is mildly increased activity within the clivus without clear corresponding finding on the CT images. Attention on follow-up recommended. Nonobstructing right renal calculi. 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   06/09/18 PET/CT revealedSoft tissue lesion within the ventral pelvis is again identified demonstrating mild to moderate increased uptake within SUV max of 5.61. Deauville criteria 4. Concerning for residual metabolically active tumor. 2. Similar appearance of diffusely thickened appendix within the right lower quadrant of the abdomen. Findings may reflect treatment related changes. 3. Increased radiotracer uptake throughout the bone marrow which is favored to represent treatment related changes. 4. Lad coronary artery atherosclerotic calcifications.   patient completed planned chemorx on 10/21  10/05/18 CT A/P revealed Persistent wall thickening in the sigmoid colon although a component of this could be due to  diverticulosis. There is also a suggestion of wall thickening and potential mucosal enhancement in the terminal ileum, as well as adjacent indistinct stranding in the mesenteric and omental adipose tissues roughly similar to the prior exam. Some of this may be treatment related. Inflammatory process such as Crohn's disease not excluded. 2. No current adenopathy. 3. Other imaging findings of potential clinical significance: Nonobstructive right nephrolithiasis. Aortic Atherosclerosis. Moderate prostatomegaly. Left foraminal impingement at L4-5.  S/p IMRT with 36 Gy over 20 fractions, between 11/09/18 and 12/06/18 with Dr. Isidore Moos  2.H/oRLE DVT and b/l PEon anticoagulation.   3. Small bowel obstruction/ileus with sigmoid thickening/stenosis .  CT scan - sigmoid obstructive lesion ? Scar tissue from resolved lymphoma vs residual lymphoma vs post inflammatory scarring from diverticulitis/limited perforation. Also has SBO from adhesions. Colonoscopy 08/21/2018 - no intramucosal lesions. Extrinsic compression of sigmoid colon. 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 Greer Pickerel  4.  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  GI panel and C. difficile negative.  CMV IgM negative, EBV IgM negative. 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  5. Resolved Dehydration with hypovolemic hyponatremia.  Other electrolyte issues including hypokalemia and hypomagnesemia -stabilizing.  6.  Pancytopenia Anemia likely multifactorial but could be from GI bleeding plus significant inflammation from severe radiation colitis. Leukopenia/lymphopenia likely from radiation toxicity. Protein-losing enteropathy can also accentuate the lymphopenia and bone marrow suppression due to severe inflammatory state  from his radiation colitis. Thrombocytopenia from consumption related to GI bleeding and from radiation toxicity. No overt evidence of  DIC. Significantly elevated fibrinogen level as a marker of severe inflammation and acute phase reactant. B12 within normal limits Copper levels within normal limits at 132 Zinc deficiency noted- 44 Viral work-up not revealing Low likelihood of Burkitt's lymphoma progression at this time.  PLAN: -Discussed pt labwork today, 01/25/19; WBC normalized to 6.8k, HGB improved to 8.7, PLT improved to 139k, Sodium slightly low at 131. Albumin increasing now to 2.4. Alk phos at 162. Calcium normalized. Sed rate at 135. -Increase electrolyte consumption, and okay to mildly and temporarily increase salt intake with lower sodium -Continue 74m Dexamethasone each morning, and 533mMarinol BID for appetite stimulation. Dexa will also improve inflammation from colitis. -Resuming Eliquis at preventative dose of 2.26m34mliquis BID, no concern of rectal bleeding at this time -Continue optimizing nutrition and functional status with walking and getting up and about -Home care services and Home PT/OT -Continue follow up with Dr. DanOwens [facility]. Home enemas? -tracking sed rate and CRP as a function of resoliving bowel inflammation. -PO replacement for zinc deficiency. -Empiric B complex. -Will check labs again in 10 days and follow up with pt via phone visit in 11 days   Labs in 10 days with portflush Telephone visit with Dr KalIrene Limboe day after labs    I discussed the assessment and treatment plan with the patient. The patient was provided an opportunity to ask questions and all were answered. The patient agreed with the plan and demonstrated an understanding of the instructions.   The patient was advised to call back or seek an in-person evaluation if the symptoms worsen or if the condition fails to improve as anticipated.  The total time spent in the appt was 30 minutes and more than 50% was on counseling and direct patient cares.    GauSullivan Lone MS [place]HIVMS SCHUc [facility] CenterHRiverside Surgery Center Incmatology/Oncology  Physician ConOswego Community HospitalOffice):       [phone] cell):  [phone]):           [phone], SchBaldwin Jamaicam acting as a scribe for Dr. GauSullivan Lone .I have reviewed the above documentation for accuracy and completeness, and I agree with the above. .GaBrunetta Genera

## 2019-01-25 NOTE — Telephone Encounter (Signed)
Copied from [place] [id number]. Topic: General - Other >> Jan 22, 2019  2:16 PM Leward Quan A wrote: Reason for CRM: Beth with [facility] called and would like to draw blood work for Magnesium asking can it be added to orders that are to be taken on 01/24/2019. Also would like to know how often Magnesium 500 MG CAPS is to be taken orders from hospital read twice but patient is only taking it once a day. Please call Beth at Ph# [phone] >> Jan 24, 2019  3:58 PM Jodie Echevaria wrote: Eustaquio Maize with [facility] called and would like HgA1c added to patient chart for blood draw that will take place on 01/25/2019 with Oncology. Asking for orders to be placed soon please.

## 2019-01-25 NOTE — Telephone Encounter (Signed)
Labs has been added. Beth notified of update. Called Lovey Newcomer (nurse) phone number provider by Eustaquio Maize to call [place]. Lovey Newcomer stated that she is able to see the future orders. No further action needed!

## 2019-01-26 ENCOUNTER — Telehealth: Payer: Self-pay | Admitting: Family Medicine

## 2019-01-26 ENCOUNTER — Telehealth: Payer: Self-pay | Admitting: Hematology

## 2019-01-26 ENCOUNTER — Inpatient Hospital Stay (HOSPITAL_BASED_OUTPATIENT_CLINIC_OR_DEPARTMENT_OTHER): Payer: BC Managed Care – PPO | Admitting: Hematology

## 2019-01-26 DIAGNOSIS — E119 Type 2 diabetes mellitus without complications: Secondary | ICD-10-CM

## 2019-01-26 DIAGNOSIS — K520 Gastroenteritis and colitis due to radiation: Secondary | ICD-10-CM | POA: Diagnosis not present

## 2019-01-26 DIAGNOSIS — C8378 Burkitt lymphoma, lymph nodes of multiple sites: Secondary | ICD-10-CM | POA: Diagnosis not present

## 2019-01-26 DIAGNOSIS — Z432 Encounter for attention to ileostomy: Secondary | ICD-10-CM | POA: Diagnosis not present

## 2019-01-26 DIAGNOSIS — Z86718 Personal history of other venous thrombosis and embolism: Secondary | ICD-10-CM | POA: Diagnosis not present

## 2019-01-26 DIAGNOSIS — E440 Moderate protein-calorie malnutrition: Secondary | ICD-10-CM

## 2019-01-26 DIAGNOSIS — Z5181 Encounter for therapeutic drug level monitoring: Secondary | ICD-10-CM | POA: Diagnosis not present

## 2019-01-26 DIAGNOSIS — Z86711 Personal history of pulmonary embolism: Secondary | ICD-10-CM | POA: Diagnosis not present

## 2019-01-26 DIAGNOSIS — C8510 Unspecified B-cell lymphoma, unspecified site: Secondary | ICD-10-CM | POA: Diagnosis not present

## 2019-01-26 DIAGNOSIS — D61818 Other pancytopenia: Secondary | ICD-10-CM | POA: Diagnosis not present

## 2019-01-26 DIAGNOSIS — K9049 Malabsorption due to intolerance, not elsewhere classified: Secondary | ICD-10-CM | POA: Diagnosis not present

## 2019-01-26 NOTE — Telephone Encounter (Signed)
I spoke with [facility] and informed her of Dr. Ansel Bong response to a verbal consent for treatment for patient's stage 2 ulcer.   Beth verbalized understanding.

## 2019-01-26 NOTE — Telephone Encounter (Signed)
Patient has been evaluated by nursing and found to have a stage II pressure ulcer- please provide verbal treatment for evaluation and treatment by home health nursing staff

## 2019-01-26 NOTE — Telephone Encounter (Unsigned)
Copied from [place] [id number]. Topic: Quick Communication - Home Health Verbal Orders >> Jan 26, 2019  9:28 AM Alanda Slim E wrote: Caller/Agency: [facility]/ [facility] Callback Number: [phone]   Pt Has a stage 2 pressure ulcer on his back side and Nurse needs verification from Dr. Yong Channel saying the Pt has this stage 2 pressure ulcer in the chart for medicare and if possible a verbal

## 2019-01-26 NOTE — Telephone Encounter (Signed)
See note

## 2019-01-26 NOTE — Telephone Encounter (Signed)
Please advise 

## 2019-01-26 NOTE — Telephone Encounter (Signed)
Scheduled appt per 5/15 los.  Spoke with patient spouse and she is aware of appt date and time.

## 2019-01-29 DIAGNOSIS — Z86718 Personal history of other venous thrombosis and embolism: Secondary | ICD-10-CM | POA: Diagnosis not present

## 2019-01-29 DIAGNOSIS — C8510 Unspecified B-cell lymphoma, unspecified site: Secondary | ICD-10-CM | POA: Diagnosis not present

## 2019-01-29 DIAGNOSIS — K9049 Malabsorption due to intolerance, not elsewhere classified: Secondary | ICD-10-CM | POA: Diagnosis not present

## 2019-01-29 DIAGNOSIS — L89152 Pressure ulcer of sacral region, stage 2: Secondary | ICD-10-CM | POA: Diagnosis not present

## 2019-01-29 DIAGNOSIS — Z86711 Personal history of pulmonary embolism: Secondary | ICD-10-CM | POA: Diagnosis not present

## 2019-01-29 DIAGNOSIS — K520 Gastroenteritis and colitis due to radiation: Secondary | ICD-10-CM | POA: Diagnosis not present

## 2019-01-29 DIAGNOSIS — E119 Type 2 diabetes mellitus without complications: Secondary | ICD-10-CM | POA: Diagnosis not present

## 2019-01-29 DIAGNOSIS — Z432 Encounter for attention to ileostomy: Secondary | ICD-10-CM | POA: Diagnosis not present

## 2019-01-29 DIAGNOSIS — D61818 Other pancytopenia: Secondary | ICD-10-CM | POA: Diagnosis not present

## 2019-01-29 DIAGNOSIS — Z5181 Encounter for therapeutic drug level monitoring: Secondary | ICD-10-CM | POA: Diagnosis not present

## 2019-01-30 ENCOUNTER — Telehealth: Payer: Self-pay | Admitting: Family Medicine

## 2019-01-30 DIAGNOSIS — D61818 Other pancytopenia: Secondary | ICD-10-CM

## 2019-01-30 DIAGNOSIS — K520 Gastroenteritis and colitis due to radiation: Secondary | ICD-10-CM

## 2019-01-30 DIAGNOSIS — Z432 Encounter for attention to ileostomy: Secondary | ICD-10-CM

## 2019-01-30 DIAGNOSIS — E119 Type 2 diabetes mellitus without complications: Secondary | ICD-10-CM

## 2019-01-30 DIAGNOSIS — Z5181 Encounter for therapeutic drug level monitoring: Secondary | ICD-10-CM

## 2019-01-30 DIAGNOSIS — Z86711 Personal history of pulmonary embolism: Secondary | ICD-10-CM

## 2019-01-30 DIAGNOSIS — Z86718 Personal history of other venous thrombosis and embolism: Secondary | ICD-10-CM

## 2019-01-30 DIAGNOSIS — L89152 Pressure ulcer of sacral region, stage 2: Secondary | ICD-10-CM

## 2019-01-30 DIAGNOSIS — K9049 Malabsorption due to intolerance, not elsewhere classified: Secondary | ICD-10-CM

## 2019-01-30 DIAGNOSIS — C8510 Unspecified B-cell lymphoma, unspecified site: Secondary | ICD-10-CM

## 2019-01-30 LAB — MAGNESIUM: Magnesium: 1.7 mg/dL (ref 1.6–2.3)

## 2019-01-30 LAB — HGB A1C W/O EAG: Hgb A1c MFr Bld: 6 % — ABNORMAL HIGH (ref 4.8–5.6)

## 2019-01-30 NOTE — Telephone Encounter (Signed)
Verbal orders given to [facility]. No further action needed!

## 2019-01-30 NOTE — Telephone Encounter (Signed)
Yes thanks may order

## 2019-01-30 NOTE — Telephone Encounter (Signed)
Copied from [place] [id number]. Topic: Quick Communication - Home Health Verbal Orders >> Jan 29, 2019  3:39 PM Loma [place] wrote: CRM for notification. See Telephone encounter for: 01/29/19. [facility] Beth called verbal call back [phone] Mr Wickham orders are out on Thursday. His wife is requesting of [facility] to for another 2 wks @ 2 times and once  a week for 6 wks. This is for ostomy care and wife needs support. FU with verbal to [facility] # above [phone]

## 2019-01-30 NOTE — Telephone Encounter (Signed)
Okay for orders?

## 2019-01-31 ENCOUNTER — Ambulatory Visit (INDEPENDENT_AMBULATORY_CARE_PROVIDER_SITE_OTHER): Payer: BLUE CROSS/BLUE SHIELD | Admitting: Gastroenterology

## 2019-01-31 ENCOUNTER — Encounter: Payer: Self-pay | Admitting: Gastroenterology

## 2019-01-31 ENCOUNTER — Ambulatory Visit: Payer: BLUE CROSS/BLUE SHIELD | Admitting: Gastroenterology

## 2019-01-31 ENCOUNTER — Other Ambulatory Visit: Payer: Self-pay

## 2019-01-31 VITALS — Ht 69.0 in | Wt 176.0 lb

## 2019-01-31 DIAGNOSIS — C8373 Burkitt lymphoma, intra-abdominal lymph nodes: Secondary | ICD-10-CM

## 2019-01-31 DIAGNOSIS — E119 Type 2 diabetes mellitus without complications: Secondary | ICD-10-CM | POA: Diagnosis not present

## 2019-01-31 DIAGNOSIS — K9049 Malabsorption due to intolerance, not elsewhere classified: Secondary | ICD-10-CM | POA: Diagnosis not present

## 2019-01-31 DIAGNOSIS — Z432 Encounter for attention to ileostomy: Secondary | ICD-10-CM | POA: Diagnosis not present

## 2019-01-31 DIAGNOSIS — Z86711 Personal history of pulmonary embolism: Secondary | ICD-10-CM | POA: Diagnosis not present

## 2019-01-31 DIAGNOSIS — K520 Gastroenteritis and colitis due to radiation: Secondary | ICD-10-CM | POA: Diagnosis not present

## 2019-01-31 DIAGNOSIS — C8510 Unspecified B-cell lymphoma, unspecified site: Secondary | ICD-10-CM | POA: Diagnosis not present

## 2019-01-31 DIAGNOSIS — Z5181 Encounter for therapeutic drug level monitoring: Secondary | ICD-10-CM | POA: Diagnosis not present

## 2019-01-31 DIAGNOSIS — L89152 Pressure ulcer of sacral region, stage 2: Secondary | ICD-10-CM | POA: Diagnosis not present

## 2019-01-31 DIAGNOSIS — Z86718 Personal history of other venous thrombosis and embolism: Secondary | ICD-10-CM | POA: Diagnosis not present

## 2019-01-31 DIAGNOSIS — D61818 Other pancytopenia: Secondary | ICD-10-CM | POA: Diagnosis not present

## 2019-01-31 NOTE — Patient Instructions (Addendum)
He will continue pushing nutritional intake and overall calorie.  We will arrange return office visit hopefully in person with me in 2 months from now.   Thank you for entrusting me with your care and choosing [facility].  Dr Ardis Hughs

## 2019-01-31 NOTE — Progress Notes (Signed)
Review of pertinent gastrointestinal problems: 1.  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  There is partial encasement of the sigmoid colon by the mass.  There is prominent extension of the mass into the right lower retroperitoneum and extraperitoneal right pelvis with encasement of the right external iliac and right common iliac vasculature and with infiltration of the right iliopsoas muscle": Chemotherapy until about November 2019.  Dramatic improvement in tumor size, burden.  He presented with bowel obstruction December 2019 colonoscopy (to the cecum) by 1 of my partners which proved 10 cm long sigmoid stenosis.  Bowel is otherwise normal.  He underwent a loop ileostomy Dr. Greer Pickerel December 2019 and in the op report Dr. Redmond Pulling described a woody texture to his cecum, terminal ileum as well as I believe the sigmoid region.  2. Acute diffuse colitis 12/2018 (admitted several days in hospital ) CT scan c/w acute diffuse colitis, maybe radiation vrs diversion colitis. Infectious workup including c-diff, gi path panel, CMV, EBV all negative. Had improvement with short chain fatty acid enemas but stopped due to neutropenia. Cholestyramine daily may have helped.    This service was provided via virtual visit.  Audio and visual was attempted however they could not connect to a system correctly.  The patient was located at home.  His wife was on the phone as well.  I was located in my office.  The patient did consent to this virtual visit and is aware of possible charges through their insurance for this visit.  The patient is an established patient.  My certified medical assistant, Grace Bushy, contributed to this visit by contacting the patient by phone 1 or 2 business days prior to the appointment and also followed up on the recommendations I made after  the visit.  Time spent on virtual visit: 23 minutes   HPI: This is a very pleasant 63 year old man.  I last saw him 3 to 4 months ago however he was recently hospitalized about a week after his radiation was completed with severe radiation enteritis colitis.  He spent almost 2 weeks in the hospital.  He was having bleeding from his ostomy, bleeding from his rectum, significant watery output into his ostomy.  His wife gave a lot of the history. He was clearly weak.  His appetite is still a challenge. On a usual day cereal in the morning, yogurt mid morning.  Lunch a piece of chicken.  3/4 of a beef burrito.  His ostomy output is much more solid.  It is back to pudding consistency.  No blood overtly.  He is still having a mucous discharge, no overt bleeding.  Has really tapered off.  Still has a sticky mucous from his bottom, goes through depends several times per day.    All his problems GI wise started about a week after radiation ended (which the end of March).  There are no plans for any further radiation.  January and February he felt great, really nearly back to normal.    Chief complaint is Burkitt's lymphoma, patient radiation related damage to his small bowel and colon  ROS: complete GI ROS as described in HPI, all other review negative.  Constitutional:  No unintentional weight loss   Past Medical History:  Diagnosis Date  . ALLERGIC RHINITIS   . Cancer ([place])    Lymphoma   . Diabetes  mellitus   . Hyperlipidemia     Past Surgical History:  Procedure Laterality Date  . BIOPSY  03/15/2018   Procedure: BIOPSY;  Surgeon: Milus Banister, MD;  Location: [name];  Service: Endoscopy;;  . BOWEL RESECTION N/A 08/25/2018   Procedure: LAPROSCOPIC LOOP ILEOSTOMY;  Surgeon: Greer Pickerel, MD;  Location: [facility];  Service: General;  Laterality: N/A;  . CLEFT PALATE REPAIR    . COLONOSCOPY N/A 03/15/2018   Procedure: COLONOSCOPY;  Surgeon: Milus Banister, MD;  Location: [facility];  Service: Endoscopy;  Laterality: N/A;  . COLONOSCOPY N/A 08/20/2018   Procedure: COLONOSCOPY;  Surgeon: Irene Shipper, MD;  Location: [facility];  Service: Endoscopy;  Laterality: N/A;  . deviated septum repair     slight improvement  . ESOPHAGOGASTRODUODENOSCOPY N/A 03/15/2018   Procedure: ESOPHAGOGASTRODUODENOSCOPY (EGD);  Surgeon: Milus Banister, MD;  Location: [name];  Service: Endoscopy;  Laterality: N/A;  . IR IMAGING GUIDED PORT INSERTION  03/17/2018  . LAPAROSCOPY N/A 08/25/2018   Procedure: LAPAROSCOPY DIAGNOSTIC;  Surgeon: Greer Pickerel, MD;  Location: [facility];  Service: General;  Laterality: N/A;  . TONSILLECTOMY      Current Outpatient Medications  Medication Sig Dispense Refill  . Amino Acids-Protein Hydrolys (FEEDING SUPPLEMENT, PRO-STAT SUGAR FREE 64,) LIQD Take 30 mLs by mouth 2 (two) times daily. 887 mL 0  . apixaban (ELIQUIS) 5 MG TABS tablet DO NOT TAKE UNTIL SEEN BY YOUR ONCOLOGIST (Patient taking differently: Take 5 mg by mouth daily. DO NOT TAKE UNTIL SEEN BY YOUR ONCOLOGIST) 60 tablet 3  . B Complex-C (B-COMPLEX WITH VITAMIN C) tablet Take 1 tablet by mouth daily for 30 days. 30 tablet 0  . blood glucose meter kit and supplies Dispense based on patient and insurance preference. Use daily as directed. (E11.9). 1 each 0  . dexamethasone (DECADRON) 4 MG tablet Take 1 tablet (4 mg total) by mouth daily with breakfast. 30 tablet 0  . dronabinol (MARINOL) 5 MG capsule Take 1 capsule (5 mg total) by mouth 2 (two) times daily before lunch and supper. 60 capsule 0  . feeding supplement, ENSURE ENLIVE, (ENSURE ENLIVE) LIQD Take 237 mLs by mouth 2 (two) times daily between meals. 237 mL 12  . glucose blood (FREESTYLE TEST STRIPS) test strip Use to check blood sugar daily 100 each 4  . Magnesium 500 MG CAPS Take 1 capsule (500 mg total) by mouth 2 (two) times daily. (Patient taking differently: Take 500 mg by mouth 2 (two) times a day. ) 60 capsule 0  . ondansetron  (ZOFRAN) 4 MG tablet Take 1 tablet (4 mg total) by mouth every 6 (six) hours as needed for nausea. 20 tablet 0  . zinc sulfate 220 (50 Zn) MG capsule Take 1 capsule (220 mg total) by mouth 2 (two) times daily for 30 days. 60 capsule 0   No current facility-administered medications for this visit.     Allergies as of 01/31/2019 - Review Complete 01/31/2019  Allergen Reaction Noted  . Ciprofloxacin Other (See Comments) 01/30/2018    Family History  Problem Relation Age of Onset  . Lung cancer Mother        smoker  . Brain cancer Mother        metastasis  . AAA (abdominal aortic aneurysm) Father        smoker    Social History   Socioeconomic History  . Marital status: Married    Spouse name: lisa  . Number of children:  0  . Years of education: Not on file  . Highest education level: Not on file  Occupational History  . Not on file  Social Needs  . Financial resource strain: Not hard at all  . Food insecurity:    Worry: Never true    Inability: Never true  . Transportation needs:    Medical: No    Non-medical: No  Tobacco Use  . Smoking status: Never Smoker  . Smokeless tobacco: Never Used  Substance and Sexual Activity  . Alcohol use: Yes    Comment: occasional  . Drug use: No  . Sexual activity: Yes  Lifestyle  . Physical activity:    Days per week: 0 days    Minutes per session: 0 min  . Stress: Not at all  Relationships  . Social connections:    Talks on phone: More than three times a week    Gets together: More than three times a week    Attends religious service: 1 to 4 times per year    Active member of club or organization: No    Attends meetings of clubs or organizations: Never    Relationship status: Married  . Intimate partner violence:    Fear of current or ex partner: No    Emotionally abused: No    Physically abused: No    Forced sexual activity: No  Other Topics Concern  . Not on file  Social History Narrative   Married 1985. No kids. 4  small dogs.       Works in Financial trader, residential      Hobbies: work on cars, Haematologist, exercise as able     Physical Exam: Unable to perform because this was a "telemed visit" due to current Covid-19 pandemic  Assessment and plan: 63 y.o. male with Burkitt's lymphoma, recovering from recent severe acute radiation enteritis colitis  He is slowly recovering from the radiation related damage to his small bowel and colon.  His ostomy output is back to normal and so I suspect his small bowel is functionally completely recovered.  He is still having some mucus discharge from his rectum and so he probably still has some healing from that standpoint.  The rectal mucus could also be related to diversion related inflammation, diversion colitis.  Fortunately no more bleeding.  The acute illness has certainly taken its toll.  He is very slowly recovering.  I recommended continued emphasis on high calorie nutritional intake.  I recommended [employer] Breakfast twice daily and that they should also consider a milkshake every single day as well.  He will return to see me in 2 months, hopefully in person.  Please see the "Patient Instructions" section for addition details about the plan.  Mark Loffler, MD [facility] 01/31/2019, 9:17 AM

## 2019-02-01 ENCOUNTER — Encounter: Payer: Self-pay | Admitting: Family Medicine

## 2019-02-02 ENCOUNTER — Other Ambulatory Visit: Payer: Self-pay

## 2019-02-02 ENCOUNTER — Telehealth: Payer: Self-pay | Admitting: Family Medicine

## 2019-02-02 DIAGNOSIS — L89159 Pressure ulcer of sacral region, unspecified stage: Secondary | ICD-10-CM

## 2019-02-02 NOTE — Telephone Encounter (Signed)
This has been printed, signed, and faxed to [place].

## 2019-02-02 NOTE — Telephone Encounter (Signed)
Copied from [place] [id number]. Topic: Quick Communication - See Telephone Encounter >> Feb 01, 2019  5:08 PM [place] wrote: CRM for notification. See Telephone encounter for: 02/01/19. [facility]... Beth. Contact # [phone]  This is for Dr Ronney Lion pt... Need a fax sent to [place] fax # [phone] This is for an order for pt for wound supplies, Please state as follows Please send supplies for Stage 2 pressure sore to [employer]. Supplies are as follows: 1. Allevyn Foam  2. [employer]  3. [employer] Barrier Creme  4. Wound spray   5. 4 x 4 non-sterile gauze

## 2019-02-06 ENCOUNTER — Telehealth: Payer: Self-pay

## 2019-02-06 ENCOUNTER — Ambulatory Visit: Payer: Self-pay | Admitting: *Deleted

## 2019-02-06 ENCOUNTER — Ambulatory Visit (INDEPENDENT_AMBULATORY_CARE_PROVIDER_SITE_OTHER): Payer: BLUE CROSS/BLUE SHIELD | Admitting: Family Medicine

## 2019-02-06 ENCOUNTER — Telehealth: Payer: Self-pay | Admitting: *Deleted

## 2019-02-06 ENCOUNTER — Encounter: Payer: Self-pay | Admitting: Family Medicine

## 2019-02-06 DIAGNOSIS — R531 Weakness: Secondary | ICD-10-CM

## 2019-02-06 NOTE — Telephone Encounter (Signed)
This has been handled. See phone note from 02/06/2019 from me. Pt has been scheduled

## 2019-02-06 NOTE — Progress Notes (Signed)
Phone [phone]   Subjective:  Virtual visit via Video note. Chief complaint: Chief Complaint  Patient presents with  . Fatigue   This visit type was conducted due to national recommendations for restrictions regarding the COVID-19 Pandemic (e.g. social distancing).  This format is felt to be most appropriate for this patient at this time balancing risks to patient and risks to population by having him in for in person visit.  No physical exam was performed (except for noted visual exam or audio findings with Telehealth visits).    Our team/I connected with Mark Frey at  4:40 PM EDT by a video enabled telemedicine application (doxy.me or caregility through epic) and verified that I am speaking with the correct person using two identifiers.  Location patient: [facility] Location provider: [facility], office Persons participating in the virtual visit:  patient  Our team/I discussed the limitations of evaluation and management by telemedicine and the availability of in person appointments. In light of current covid-19 pandemic, patient also understands that we are trying to protect them by minimizing in office contact if at all possible.  The patient expressed consent for telemedicine visit and agreed to proceed. Patient understands insurance will be billed.   ROS- no fever, chills, cough. Wife reports confusion- patient denies.    Past Medical History-  Patient Active Problem List   Diagnosis Date Noted  . 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[place]) 03/15/2018    Priority: High  . Bilateral pulmonary embolism ([place]) 03/10/2018    Priority: High  . Controlled diabetes mellitus type II without complication ([place]) 47/82/9562    Priority: High  . Morbid obesity ([place]) 12/29/2009    Priority: High  . Tachycardia 01/31/2017    Priority: Medium  . Hypertension 06/05/2014    Priority: Medium  . Irritable bowel syndrome 03/30/2010    Priority: Medium  . Hyperlipidemia 05/12/2007    Priority:  Medium  . Dysmetabolic syndrome X 13/04/6577    Priority: Low  . BACK PAIN WITH RADICULOPATHY 06/09/2007    Priority: Low  . ALLERGIC RHINITIS 05/12/2007    Priority: Low  . Hypomagnesemia 01/11/2019  . Neutropenia ([place]) 01/11/2019  . Other neutropenia ([place])   . Pressure injury of skin 01/07/2019  . Zinc deficiency   . Malnutrition of moderate degree 01/04/2019  . Radiation gastroenteritis   . Radiation colitis   . Hyponatremia 01/01/2019  . Pancytopenia ([place]) 01/01/2019  . Hematochezia   . Abnormal CT of the abdomen   . Severe protein-calorie malnutrition ([place]) 08/19/2018  . 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 Diverticulitis of colon   . Low magnesium level 07/30/2018  . Hypokalemia 07/21/2018  . Bowel obstruction in setting of lymphoma and sigmoid stricture 07/21/2018  . Diarrhea 07/21/2018  . Burkitt lymphoma, lymph nodes of multiple sites ([place]) 05/22/2018  . Diabetes mellitus ([place])   . Edema   . Deep vein thrombosis (DVT) of femoral vein of right lower extremity ([place])   . Counseling regarding advance care planning and goals of care 04/25/2018  . Gastrointestinal hemorrhage   . Encounter for antineoplastic chemotherapy   . 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[place]) 04/10/2018  . Port-A-Cath in place 03/27/2018  . Acute deep vein thrombosis (DVT) of femoral vein of right lower extremity ([place])   . Anemia 03/10/2018  . Hypoglycemia 03/10/2018  . Occult blood in stools 03/10/2018  . Abdominal mass 03/10/2018    Medications- reviewed and updated Current Outpatient Medications  Medication Sig Dispense  Refill  . Amino Acids-Protein Hydrolys (FEEDING SUPPLEMENT, PRO-STAT SUGAR FREE 64,) LIQD Take 30 mLs by mouth 2 (two) times daily. 887 mL 0  . apixaban (ELIQUIS) 5 MG TABS tablet DO NOT TAKE UNTIL SEEN BY YOUR ONCOLOGIST (Patient taking differently: Take 5 mg by mouth daily. DO NOT TAKE UNTIL SEEN BY YOUR ONCOLOGIST) 60 tablet 3  . B Complex-C  (B-COMPLEX WITH VITAMIN C) tablet Take 1 tablet by mouth daily for 30 days. 30 tablet 0  . blood glucose meter kit and supplies Dispense based on patient and insurance preference. Use daily as directed. (E11.9). 1 each 0  . dexamethasone (DECADRON) 4 MG tablet Take 1 tablet (4 mg total) by mouth daily with breakfast. 30 tablet 0  . dronabinol (MARINOL) 5 MG capsule Take 1 capsule (5 mg total) by mouth 2 (two) times daily before lunch and supper. 60 capsule 0  . feeding supplement, ENSURE ENLIVE, (ENSURE ENLIVE) LIQD Take 237 mLs by mouth 2 (two) times daily between meals. 237 mL 12  . glucose blood (FREESTYLE TEST STRIPS) test strip Use to check blood sugar daily 100 each 4  . Magnesium 500 MG CAPS Take 1 capsule (500 mg total) by mouth 2 (two) times daily. (Patient taking differently: Take 500 mg by mouth 2 (two) times a day. ) 60 capsule 0  . ondansetron (ZOFRAN) 4 MG tablet Take 1 tablet (4 mg total) by mouth every 6 (six) hours as needed for nausea. 20 tablet 0  . zinc sulfate 220 (50 Zn) MG capsule Take 1 capsule (220 mg total) by mouth 2 (two) times daily for 30 days. 60 capsule 0   No current facility-administered medications for this visit.      Objective:  no self reported vitals Gen: sitting in his chair- unable to stand with or without support.  Lungs: nonlabored, normal respiratory rate  Skin: appears dry, no obvious rash    Assessment and Plan   # Weakness/fatigue S:Last few days he is eating very very little- perhaps just ice cream since Friday. He used to be able to get up and stand up and they could walk to the bathroom to empty colostomy bag but he has refused to do that today due to weakness. Wife emptied it while he was sitting. He simply has been unable to get out of his chair today even with assistance. 1 am this morning last time out of chair  Due to weakness and concern for possible confusion-EMS called. Patient is able to answer baseic questions though and Paramedics  try to get him to go to the hospital but he refuses.   Patient has 2/3 word distracted recall and otherwise is alert and oriented.   We asked about getting labs from home to assess him further but home health RN has not been able to get a vein and will have to be drawn from port- visit on Friday with oncology set up.  A/P: We had a prolonged conversation today. Patient is oriented and simply seems to have lost motivation to care for himself. I offered palliative care or even hospice (if he wants to decline all care) but he declines for now- he basically says he wants to sit in his chair and rest and doesn't want to go to hospital or try other activities. hes aware of my recommendations to go to hospital and can also state the reason for my concern (weakness and concern for underlying condition) as well as potential consequences like irreversible damage to  his health and even potential for death. He states despite that he declines EMS care. Initially patient seemed to not be able to answer all these questions but as soon as I was clear about my concern about capacity and needing to hospitalize him- he answered easily.  - I will call to check on him tomorrow. Patient does have a history of being stubborn in prior encounters pre cancer but has genuinely been willing to seek care more recently . Will call back to see if examples of confusion overnight in next day or so.   - not sure he can even make it to oncology office to get labs done on 29th honestly.  - wife also trying to get visit with [name]- I will cc him on this chart.  - wife says no HCPOA- we really need to try to get advanced directives set up regardless of disposition of current illness.  -wife did not give me vital signs but apparently these were reportedly normal when discussing with nurse  Future Appointments  Date Time Provider [place]  02/09/2019 10:00 AM [facility] LAB 2 [facility] None  02/09/2019 10:15 AM [facility] None  02/14/2019  9:40 AM [name], MD [facility] None  04/13/2019  2:00 PM [name], MD [facility] None  05/22/2019 10:40 AM [name], MD [facility]   Lab/Order associations: Generalized weakness  Time Stamp The duration of face-to-face time during this visit was greater than 25 minutes. Greater than 50% of this time was spent in counseling, explanation of diagnosis, planning of further management, and/or coordination of care including primarily discussions of importance of going ot hospital and getting evaluated and explaining concerns if he does not go, counseling wife on need to take him to hospital for any worsening symptoms and fact he may at some point lose capacity to make decisions for himself.    Return precautions advised.  Garret Reddish, MD

## 2019-02-06 NOTE — Telephone Encounter (Signed)
Patient wife called to say that he is experiencing weakness, confusion, refuse to eat and is combative asking for a call back from a nurse because she need some guidance in what to do to care for her husband properly. Ph# [phone]  Wife is concerned that patient is not acting himself- he seems to be stopping- not eating much- refusing help in any way. She is very scared for him. She does not want to watch him die in the recliner. She feels he may need labs and fluids- he is very weak and can not walk unassisted. He declined to go with EMS today. She wants to know if Dr Yong Channel can do anything to help her or patient.

## 2019-02-06 NOTE — Telephone Encounter (Signed)
This has been taking care of.

## 2019-02-06 NOTE — Telephone Encounter (Signed)
According to pt spouse Mark Frey, pt is not doing well for past 3 days. Mark Frey stated that he is not eating, having confusion and unable to walk. Pt wife tried to take pt to ER  But he is refusing. Pt wife called EMS to transport pt but pt declined and did not tell the EMS the truth per wife. Mark Frey stated that pt would prob. Be willing to do virtual visit but she is afraid that he will not tell Dr. Yong Channel the truth. Mark Frey wants to know if Dr. Yong Channel will do the visit but she will prob. Have to do most of the talking.   Labs results have been given.

## 2019-02-06 NOTE — Telephone Encounter (Signed)
See note

## 2019-02-06 NOTE — Telephone Encounter (Signed)
Wife called. Patient appetite was up and down following d/c from hospital, now has declined most food/drink for past 3 days. Has been in recliner for a day. Too weak to stand and will not use walker. Refused to go into bathroom to allow her to empty ostomy.  Wife thought he was getting dehydrated due to lack of intake and called EMS this morning. Patient able to give name, address and answer questions and declined transport to hospital, despite wife's urging him to go. Wife states he's just not acting like himself. She asked if he could have an appointment on 5/29 with [name], not just come in for lab work. She is hoping [name] will have some advice or an idea. Informed her that [name] will receive information and questions.

## 2019-02-06 NOTE — Patient Instructions (Signed)
Health Maintenance Due  Topic Date Due  . PNEUMOCOCCAL POLYSACCHARIDE VACCINE AGE 63-64 HIGH RISK  07/15/1958  . TETANUS/TDAP  09/17/2008  . OPHTHALMOLOGY EXAM  03/23/2016  . FOOT EXAM  01/31/2018  . URINE MICROALBUMIN  01/31/2018    Depression screen PHQ 2/9 05/22/2018 05/09/2017  Decreased Interest 0 0  Down, Depressed, Hopeless 0 0  PHQ - 2 Score 0 0  Some recent data might be hidden   Video visit

## 2019-02-07 ENCOUNTER — Other Ambulatory Visit: Payer: Self-pay

## 2019-02-07 ENCOUNTER — Encounter (HOSPITAL_COMMUNITY): Payer: Self-pay

## 2019-02-07 ENCOUNTER — Telehealth: Payer: Self-pay | Admitting: *Deleted

## 2019-02-07 ENCOUNTER — Inpatient Hospital Stay (HOSPITAL_COMMUNITY)
Admission: EM | Admit: 2019-02-07 | Discharge: 2019-03-06 | DRG: 371 | Disposition: A | Discharge status: Skilled Nursing Facility | Payer: BC Managed Care – PPO | Attending: Internal Medicine | Admitting: Internal Medicine

## 2019-02-07 ENCOUNTER — Other Ambulatory Visit (HOSPITAL_COMMUNITY): Payer: Self-pay

## 2019-02-07 DIAGNOSIS — E1169 Type 2 diabetes mellitus with other specified complication: Secondary | ICD-10-CM | POA: Diagnosis not present

## 2019-02-07 DIAGNOSIS — I82411 Acute embolism and thrombosis of right femoral vein: Secondary | ICD-10-CM | POA: Diagnosis not present

## 2019-02-07 DIAGNOSIS — Z20828 Contact with and (suspected) exposure to other viral communicable diseases: Secondary | ICD-10-CM | POA: Diagnosis not present

## 2019-02-07 DIAGNOSIS — R188 Other ascites: Secondary | ICD-10-CM | POA: Diagnosis not present

## 2019-02-07 DIAGNOSIS — K6389 Other specified diseases of intestine: Secondary | ICD-10-CM | POA: Diagnosis not present

## 2019-02-07 DIAGNOSIS — F32A Depression, unspecified: Secondary | ICD-10-CM

## 2019-02-07 DIAGNOSIS — D638 Anemia in other chronic diseases classified elsewhere: Secondary | ICD-10-CM | POA: Diagnosis present

## 2019-02-07 DIAGNOSIS — K630 Abscess of intestine: Secondary | ICD-10-CM | POA: Diagnosis not present

## 2019-02-07 DIAGNOSIS — D61818 Other pancytopenia: Secondary | ICD-10-CM | POA: Diagnosis present

## 2019-02-07 DIAGNOSIS — E11649 Type 2 diabetes mellitus with hypoglycemia without coma: Secondary | ICD-10-CM | POA: Diagnosis not present

## 2019-02-07 DIAGNOSIS — Z923 Personal history of irradiation: Secondary | ICD-10-CM | POA: Diagnosis not present

## 2019-02-07 DIAGNOSIS — Z6822 Body mass index (BMI) 22.0-22.9, adult: Secondary | ICD-10-CM

## 2019-02-07 DIAGNOSIS — E785 Hyperlipidemia, unspecified: Secondary | ICD-10-CM | POA: Diagnosis not present

## 2019-02-07 DIAGNOSIS — Z515 Encounter for palliative care: Secondary | ICD-10-CM

## 2019-02-07 DIAGNOSIS — K520 Gastroenteritis and colitis due to radiation: Secondary | ICD-10-CM | POA: Diagnosis not present

## 2019-02-07 DIAGNOSIS — Z9221 Personal history of antineoplastic chemotherapy: Secondary | ICD-10-CM

## 2019-02-07 DIAGNOSIS — D696 Thrombocytopenia, unspecified: Secondary | ICD-10-CM | POA: Diagnosis not present

## 2019-02-07 DIAGNOSIS — Z801 Family history of malignant neoplasm of trachea, bronchus and lung: Secondary | ICD-10-CM

## 2019-02-07 DIAGNOSIS — K56699 Other intestinal obstruction unspecified as to partial versus complete obstruction: Secondary | ICD-10-CM | POA: Diagnosis not present

## 2019-02-07 DIAGNOSIS — Z7189 Other specified counseling: Secondary | ICD-10-CM | POA: Diagnosis not present

## 2019-02-07 DIAGNOSIS — Z7401 Bed confinement status: Secondary | ICD-10-CM | POA: Diagnosis not present

## 2019-02-07 DIAGNOSIS — R933 Abnormal findings on diagnostic imaging of other parts of digestive tract: Secondary | ICD-10-CM

## 2019-02-07 DIAGNOSIS — L89152 Pressure ulcer of sacral region, stage 2: Secondary | ICD-10-CM | POA: Diagnosis present

## 2019-02-07 DIAGNOSIS — K5651 Intestinal adhesions [bands], with partial obstruction: Secondary | ICD-10-CM | POA: Diagnosis not present

## 2019-02-07 DIAGNOSIS — D6959 Other secondary thrombocytopenia: Secondary | ICD-10-CM | POA: Diagnosis not present

## 2019-02-07 DIAGNOSIS — Z808 Family history of malignant neoplasm of other organs or systems: Secondary | ICD-10-CM

## 2019-02-07 DIAGNOSIS — E871 Hypo-osmolality and hyponatremia: Secondary | ICD-10-CM

## 2019-02-07 DIAGNOSIS — Z7901 Long term (current) use of anticoagulants: Secondary | ICD-10-CM

## 2019-02-07 DIAGNOSIS — E860 Dehydration: Secondary | ICD-10-CM | POA: Diagnosis not present

## 2019-02-07 DIAGNOSIS — Z8572 Personal history of non-Hodgkin lymphomas: Secondary | ICD-10-CM | POA: Diagnosis present

## 2019-02-07 DIAGNOSIS — R627 Adult failure to thrive: Secondary | ICD-10-CM | POA: Diagnosis present

## 2019-02-07 DIAGNOSIS — E872 Acidosis: Secondary | ICD-10-CM | POA: Diagnosis present

## 2019-02-07 DIAGNOSIS — G9341 Metabolic encephalopathy: Secondary | ICD-10-CM | POA: Diagnosis present

## 2019-02-07 DIAGNOSIS — R531 Weakness: Secondary | ICD-10-CM | POA: Diagnosis not present

## 2019-02-07 DIAGNOSIS — K651 Peritoneal abscess: Secondary | ICD-10-CM | POA: Diagnosis not present

## 2019-02-07 DIAGNOSIS — C8370 Burkitt lymphoma, unspecified site: Secondary | ICD-10-CM | POA: Diagnosis present

## 2019-02-07 DIAGNOSIS — E876 Hypokalemia: Secondary | ICD-10-CM

## 2019-02-07 DIAGNOSIS — E861 Hypovolemia: Secondary | ICD-10-CM | POA: Diagnosis not present

## 2019-02-07 DIAGNOSIS — M6259 Muscle wasting and atrophy, not elsewhere classified, multiple sites: Secondary | ICD-10-CM

## 2019-02-07 DIAGNOSIS — Z86718 Personal history of other venous thrombosis and embolism: Secondary | ICD-10-CM

## 2019-02-07 DIAGNOSIS — K51014 Ulcerative (chronic) pancolitis with abscess: Secondary | ICD-10-CM | POA: Diagnosis not present

## 2019-02-07 DIAGNOSIS — D649 Anemia, unspecified: Secondary | ICD-10-CM | POA: Diagnosis not present

## 2019-02-07 DIAGNOSIS — C8378 Burkitt lymphoma, lymph nodes of multiple sites: Secondary | ICD-10-CM | POA: Diagnosis not present

## 2019-02-07 DIAGNOSIS — Z86711 Personal history of pulmonary embolism: Secondary | ICD-10-CM | POA: Diagnosis not present

## 2019-02-07 DIAGNOSIS — F329 Major depressive disorder, single episode, unspecified: Secondary | ICD-10-CM | POA: Diagnosis not present

## 2019-02-07 DIAGNOSIS — K5100 Ulcerative (chronic) pancolitis without complications: Secondary | ICD-10-CM

## 2019-02-07 DIAGNOSIS — Y842 Radiological procedure and radiotherapy as the cause of abnormal reaction of the patient, or of later complication, without mention of misadventure at the time of the procedure: Secondary | ICD-10-CM | POA: Diagnosis present

## 2019-02-07 DIAGNOSIS — K6289 Other specified diseases of anus and rectum: Secondary | ICD-10-CM | POA: Diagnosis not present

## 2019-02-07 DIAGNOSIS — E60 Dietary zinc deficiency: Secondary | ICD-10-CM

## 2019-02-07 DIAGNOSIS — N200 Calculus of kidney: Secondary | ICD-10-CM | POA: Diagnosis not present

## 2019-02-07 DIAGNOSIS — E43 Unspecified severe protein-calorie malnutrition: Secondary | ICD-10-CM | POA: Diagnosis not present

## 2019-02-07 DIAGNOSIS — Z932 Ileostomy status: Secondary | ICD-10-CM | POA: Diagnosis not present

## 2019-02-07 DIAGNOSIS — R1084 Generalized abdominal pain: Secondary | ICD-10-CM | POA: Diagnosis not present

## 2019-02-07 DIAGNOSIS — K5732 Diverticulitis of large intestine without perforation or abscess without bleeding: Secondary | ICD-10-CM | POA: Diagnosis not present

## 2019-02-07 DIAGNOSIS — R1032 Left lower quadrant pain: Secondary | ICD-10-CM | POA: Diagnosis not present

## 2019-02-07 DIAGNOSIS — E1165 Type 2 diabetes mellitus with hyperglycemia: Secondary | ICD-10-CM | POA: Diagnosis not present

## 2019-02-07 DIAGNOSIS — C8510 Unspecified B-cell lymphoma, unspecified site: Secondary | ICD-10-CM

## 2019-02-07 DIAGNOSIS — K5720 Diverticulitis of large intestine with perforation and abscess without bleeding: Secondary | ICD-10-CM | POA: Diagnosis not present

## 2019-02-07 DIAGNOSIS — E440 Moderate protein-calorie malnutrition: Secondary | ICD-10-CM | POA: Diagnosis not present

## 2019-02-07 DIAGNOSIS — Z881 Allergy status to other antibiotic agents status: Secondary | ICD-10-CM | POA: Diagnosis not present

## 2019-02-07 DIAGNOSIS — R2689 Other abnormalities of gait and mobility: Secondary | ICD-10-CM | POA: Diagnosis not present

## 2019-02-07 DIAGNOSIS — B965 Pseudomonas (aeruginosa) (mallei) (pseudomallei) as the cause of diseases classified elsewhere: Secondary | ICD-10-CM | POA: Diagnosis not present

## 2019-02-07 DIAGNOSIS — K529 Noninfective gastroenteritis and colitis, unspecified: Secondary | ICD-10-CM

## 2019-02-07 DIAGNOSIS — B964 Proteus (mirabilis) (morganii) as the cause of diseases classified elsewhere: Secondary | ICD-10-CM | POA: Diagnosis not present

## 2019-02-07 DIAGNOSIS — Z66 Do not resuscitate: Secondary | ICD-10-CM | POA: Diagnosis not present

## 2019-02-07 DIAGNOSIS — K5730 Diverticulosis of large intestine without perforation or abscess without bleeding: Secondary | ICD-10-CM | POA: Diagnosis present

## 2019-02-07 DIAGNOSIS — K562 Volvulus: Secondary | ICD-10-CM | POA: Diagnosis not present

## 2019-02-07 DIAGNOSIS — F331 Major depressive disorder, recurrent, moderate: Secondary | ICD-10-CM | POA: Diagnosis not present

## 2019-02-07 DIAGNOSIS — K56609 Unspecified intestinal obstruction, unspecified as to partial versus complete obstruction: Secondary | ICD-10-CM | POA: Diagnosis present

## 2019-02-07 DIAGNOSIS — K640 First degree hemorrhoids: Secondary | ICD-10-CM | POA: Diagnosis present

## 2019-02-07 DIAGNOSIS — K633 Ulcer of intestine: Secondary | ICD-10-CM | POA: Diagnosis not present

## 2019-02-07 DIAGNOSIS — D630 Anemia in neoplastic disease: Secondary | ICD-10-CM | POA: Diagnosis not present

## 2019-02-07 DIAGNOSIS — Z741 Need for assistance with personal care: Secondary | ICD-10-CM | POA: Diagnosis not present

## 2019-02-07 DIAGNOSIS — R1030 Lower abdominal pain, unspecified: Secondary | ICD-10-CM | POA: Diagnosis not present

## 2019-02-07 DIAGNOSIS — R195 Other fecal abnormalities: Secondary | ICD-10-CM

## 2019-02-07 DIAGNOSIS — M6281 Muscle weakness (generalized): Secondary | ICD-10-CM | POA: Diagnosis not present

## 2019-02-07 DIAGNOSIS — M2550 Pain in unspecified joint: Secondary | ICD-10-CM | POA: Diagnosis not present

## 2019-02-07 DIAGNOSIS — E119 Type 2 diabetes mellitus without complications: Secondary | ICD-10-CM | POA: Diagnosis not present

## 2019-02-07 DIAGNOSIS — R109 Unspecified abdominal pain: Secondary | ICD-10-CM | POA: Diagnosis not present

## 2019-02-07 DIAGNOSIS — R52 Pain, unspecified: Secondary | ICD-10-CM | POA: Diagnosis not present

## 2019-02-07 DIAGNOSIS — R000 Tachycardia, unspecified: Secondary | ICD-10-CM | POA: Diagnosis not present

## 2019-02-07 LAB — BASIC METABOLIC PANEL
Anion gap: 9 (ref 5–15)
Anion gap: 8 (ref 5–15)
BUN: 16 mg/dL (ref 8–23)
BUN: 15 mg/dL (ref 8–23)
CO2: 23 mmol/L (ref 22–32)
CO2: 22 mmol/L (ref 22–32)
Calcium: 8.4 mg/dL — ABNORMAL LOW (ref 8.9–10.3)
Calcium: 8.5 mg/dL — ABNORMAL LOW (ref 8.9–10.3)
Chloride: 95 mmol/L — ABNORMAL LOW (ref 98–111)
Chloride: 98 mmol/L (ref 98–111)
Creatinine, Ser: 0.63 mg/dL (ref 0.61–1.24)
Creatinine, Ser: 0.55 mg/dL — ABNORMAL LOW (ref 0.61–1.24)
GFR calc Af Amer: >60 mL/min (ref >60)
GFR calc Af Amer: >60 mL/min (ref >60)
GFR calc non Af Amer: >60 mL/min (ref >60)
GFR calc non Af Amer: >60 mL/min (ref >60)
Glucose, Bld: 185 mg/dL — ABNORMAL HIGH (ref 70–99)
Glucose, Bld: 154 mg/dL — ABNORMAL HIGH (ref 70–99)
Potassium: 3.4 mmol/L — ABNORMAL LOW (ref 3.5–5.1)
Potassium: 3.3 mmol/L — ABNORMAL LOW (ref 3.5–5.1)
Sodium: 127 mmol/L — ABNORMAL LOW (ref 135–145)
Sodium: 128 mmol/L — ABNORMAL LOW (ref 135–145)

## 2019-02-07 LAB — CBC WITH DIFFERENTIAL/PLATELET
Abs Immature Granulocytes: 0.4 10*3/uL — ABNORMAL HIGH (ref 0.00–0.07)
Band Neutrophils: 25 %
Basophils Absolute: 0 10*3/uL (ref 0.0–0.1)
Basophils Relative: 0 %
Eosinophils Absolute: 0 10*3/uL (ref 0.0–0.5)
Eosinophils Relative: 0 %
HCT: 25.9 % — ABNORMAL LOW (ref 39.0–52.0)
Hemoglobin: 8.5 g/dL — ABNORMAL LOW (ref 13.0–17.0)
Lymphocytes Relative: 3 %
Lymphs Abs: 0.2 10*3/uL — ABNORMAL LOW (ref 0.7–4.0)
MCH: 31.1 pg (ref 26.0–34.0)
MCHC: 32.8 g/dL (ref 30.0–36.0)
MCV: 94.9 fL (ref 80.0–100.0)
Metamyelocytes Relative: 1 %
Monocytes Absolute: 0.4 10*3/uL (ref 0.1–1.0)
Monocytes Relative: 6 %
Myelocytes: 5 %
Neutro Abs: 5.4 10*3/uL (ref 1.7–7.7)
Neutrophils Relative %: 60 %
Platelets: 128 10*3/uL — ABNORMAL LOW (ref 150–400)
RBC: 2.73 MIL/uL — ABNORMAL LOW (ref 4.22–5.81)
RDW: 20.5 % — ABNORMAL HIGH (ref 11.5–15.5)
WBC: 6.3 10*3/uL (ref 4.0–10.5)
nRBC: 0 % (ref 0.0–0.2)

## 2019-02-07 LAB — URINALYSIS, ROUTINE W REFLEX MICROSCOPIC
Bacteria, UA: NONE SEEN
Bilirubin Urine: NEGATIVE
Glucose, UA: 50 mg/dL — AB
Hgb urine dipstick: NEGATIVE
Ketones, ur: NEGATIVE mg/dL
Leukocytes,Ua: NEGATIVE
Nitrite: NEGATIVE
Protein, ur: 30 mg/dL — AB
Specific Gravity, Urine: 1.024 (ref 1.005–1.030)
pH: 6 (ref 5.0–8.0)

## 2019-02-07 LAB — COMPREHENSIVE METABOLIC PANEL
ALT: 30 U/L (ref 0–44)
AST: 15 U/L (ref 15–41)
Albumin: 2.5 g/dL — ABNORMAL LOW (ref 3.5–5.0)
Alkaline Phosphatase: 141 U/L — ABNORMAL HIGH (ref 38–126)
Anion gap: 16 — ABNORMAL HIGH (ref 5–15)
BUN: 19 mg/dL (ref 8–23)
CO2: 19 mmol/L — ABNORMAL LOW (ref 22–32)
Calcium: 8.7 mg/dL — ABNORMAL LOW (ref 8.9–10.3)
Chloride: 90 mmol/L — ABNORMAL LOW (ref 98–111)
Creatinine, Ser: 0.67 mg/dL (ref 0.61–1.24)
GFR calc Af Amer: >60 mL/min (ref >60)
GFR calc non Af Amer: >60 mL/min (ref >60)
Glucose, Bld: 329 mg/dL — ABNORMAL HIGH (ref 70–99)
Potassium: 3 mmol/L — ABNORMAL LOW (ref 3.5–5.1)
Sodium: 125 mmol/L — ABNORMAL LOW (ref 135–145)
Total Bilirubin: 0.8 mg/dL (ref 0.3–1.2)
Total Protein: 5.9 g/dL — ABNORMAL LOW (ref 6.5–8.1)

## 2019-02-07 LAB — BLOOD GAS, VENOUS
Acid-Base Excess: 0.4 mmol/L (ref 0.0–2.0)
Bicarbonate: 24.2 mmol/L (ref 20.0–28.0)
O2 Saturation: 66.4 %
Patient temperature: 98.6
pCO2, Ven: 37.7 mmHg — ABNORMAL LOW (ref 44.0–60.0)
pH, Ven: 7.423 (ref 7.250–7.430)
pO2, Ven: 38.3 mmHg (ref 32.0–45.0)

## 2019-02-07 LAB — LACTIC ACID, PLASMA
Lactic Acid, Venous: 3.6 mmol/L (ref 0.5–1.9)
Lactic Acid, Venous: 2.4 mmol/L (ref 0.5–1.9)

## 2019-02-07 LAB — MAGNESIUM: Magnesium: 1.9 mg/dL (ref 1.7–2.4)

## 2019-02-07 LAB — BETA-HYDROXYBUTYRIC ACID: Beta-Hydroxybutyric Acid: 0.05 mmol/L (ref 0.05–0.27)

## 2019-02-07 LAB — SARS CORONAVIRUS 2 BY RT PCR (HOSPITAL ORDER, PERFORMED IN ~~LOC~~ HOSPITAL LAB): SARS Coronavirus 2: NEGATIVE

## 2019-02-07 LAB — GLUCOSE, CAPILLARY: Glucose-Capillary: 150 mg/dL — ABNORMAL HIGH (ref 70–99)

## 2019-02-07 LAB — CBG MONITORING, ED: Glucose-Capillary: 349 mg/dL — ABNORMAL HIGH (ref 70–99)

## 2019-02-07 MED ORDER — ACETAMINOPHEN 650 MG RE SUPP: 650.0000 mg | Freq: Four times a day (QID) | RECTAL | Status: DC | PRN

## 2019-02-07 MED ORDER — ONDANSETRON HCL 4 MG/2ML IJ SOLN
4.0000 mg | Freq: Four times a day (QID) | INTRAMUSCULAR | Status: DC | PRN
  Administered 2019-03-01: 4 mg via INTRAVENOUS

## 2019-02-07 MED ORDER — INSULIN ASPART 100 UNIT/ML ~~LOC~~ SOLN
0.0000 [IU] | Freq: Three times a day (TID) | SUBCUTANEOUS | Status: DC
  Administered 2019-02-07: 1 [IU] via SUBCUTANEOUS
  Administered 2019-02-08 (×3): 2 [IU] via SUBCUTANEOUS

## 2019-02-07 MED ORDER — SODIUM CHLORIDE 0.9 % IV SOLN
INTRAVENOUS | Status: DC
  Administered 2019-02-07 – 2019-02-08 (×2): via INTRAVENOUS

## 2019-02-07 MED ORDER — DEXAMETHASONE 4 MG PO TABS
4.0000 mg | ORAL_TABLET | Freq: Every day | ORAL | Status: DC
  Administered 2019-02-08: 4 mg via ORAL
  Filled 2019-02-07 (×2): qty 1

## 2019-02-07 MED ORDER — SODIUM CHLORIDE 0.9% FLUSH
10.0000 mL | INTRAVENOUS | Status: DC | PRN
  Administered 2019-02-07 – 2019-03-06 (×7): 10 mL
  Filled 2019-02-07 (×6): qty 40

## 2019-02-07 MED ORDER — ACETAMINOPHEN 325 MG PO TABS
650.0000 mg | ORAL_TABLET | Freq: Four times a day (QID) | ORAL | Status: DC | PRN
  Filled 2019-02-07: qty 2

## 2019-02-07 MED ORDER — SODIUM CHLORIDE 0.9 % IV BOLUS
1000.0000 mL | Freq: Once | INTRAVENOUS | Status: AC
  Administered 2019-02-07: 10:00:00 1000 mL via INTRAVENOUS

## 2019-02-07 MED ORDER — MORPHINE SULFATE (PF) 4 MG/ML IV SOLN
4.0000 mg | Freq: Once | INTRAVENOUS | Status: AC
  Administered 2019-02-07: 10:00:00 4 mg via INTRAVENOUS
  Filled 2019-02-07: qty 1

## 2019-02-07 MED ORDER — SODIUM CHLORIDE 0.9 % IV BOLUS: 1000.0000 mL | Freq: Once | INTRAVENOUS | Status: DC

## 2019-02-07 MED ORDER — ENSURE ENLIVE PO LIQD
237.0000 mL | Freq: Two times a day (BID) | ORAL | Status: DC
  Administered 2019-02-07: 237 mL via ORAL

## 2019-02-07 MED ORDER — APIXABAN 2.5 MG PO TABS
2.5000 mg | ORAL_TABLET | Freq: Two times a day (BID) | ORAL | Status: DC
  Administered 2019-02-08 – 2019-02-09 (×3): 2.5 mg via ORAL
  Filled 2019-02-07 (×4): qty 1

## 2019-02-07 MED ORDER — HYDROCODONE-ACETAMINOPHEN 5-325 MG PO TABS
1.0000 | ORAL_TABLET | ORAL | Status: DC | PRN
  Administered 2019-02-08 – 2019-02-12 (×8): 2 via ORAL
  Administered 2019-02-12: 1 via ORAL
  Administered 2019-02-13 – 2019-02-24 (×20): 2 via ORAL
  Administered 2019-02-24: 1 via ORAL
  Administered 2019-02-25 – 2019-03-05 (×18): 2 via ORAL
  Administered 2019-03-06: 1 via ORAL
  Administered 2019-03-06 (×2): 2 via ORAL
  Filled 2019-02-07 (×19): qty 2
  Filled 2019-02-07: qty 1
  Filled 2019-02-07 (×26): qty 2
  Filled 2019-02-07: qty 1
  Filled 2019-02-07 (×5): qty 2

## 2019-02-07 MED ORDER — POTASSIUM CHLORIDE 10 MEQ/100ML IV SOLN
10.0000 meq | INTRAVENOUS | Status: AC
  Administered 2019-02-07 (×3): 10 meq via INTRAVENOUS
  Filled 2019-02-07 (×3): qty 100

## 2019-02-07 MED ORDER — ONDANSETRON HCL 4 MG PO TABS: 4.0000 mg | ORAL_TABLET | Freq: Four times a day (QID) | ORAL | Status: DC | PRN

## 2019-02-07 NOTE — ED Notes (Signed)
Pt made aware urine specimen is needed.

## 2019-02-07 NOTE — Progress Notes (Signed)
Patient R chest port is not giving blood for dbiv. Patient stated that it's not the first time so I told him about putting alteplase or TPA to get blood return but he refused and stated we are not going to do it. Phlebotomist/lab and RN made aware.

## 2019-02-07 NOTE — ED Triage Notes (Signed)
Pt arrived via EMS from home. Pt has hx of Lymphoma. Pt was radiation colitis , has colostomy bag, was recent hospitalization. Pt is now c/o increased weakness x 2 weeks had been able to walk and is no longer able to and increasing lethargy.     HR 120, BP 114/78, CBG 382

## 2019-02-07 NOTE — Progress Notes (Signed)
Pt states he has lost his cell phone and is refusing care until it is found. This RN checked to see what belongings pt brought in with him according to what the admitting nurse charted. The only thing he had coming in is clothing. Pt belongings checked in room and no cell phone found. This RN will follow up with ED nurse and security to see if they had found it.

## 2019-02-07 NOTE — Telephone Encounter (Signed)
Late entry for 5pm 02/06/2019: Per [name], set up appts for patient to have labs 5/27 AM and receive fluids in Symptom Management following labs.  Attempted to contact wife Sherryle Lis, [phone] and [phone] on 5/26. Left message regarding Dr. Grier Mitts directions and advised wife that office would contact her 5/27 AM.

## 2019-02-07 NOTE — ED Notes (Signed)
Contacted lab to obtain 2nd lactic and VBG. Pt has IV access and Chest port, no blood return to report.

## 2019-02-07 NOTE — ED Notes (Signed)
Bed: LV74 Expected date:  Expected time:  Means of arrival:  Comments: EMS/cancer/abd. pain

## 2019-02-07 NOTE — H&P (Addendum)
History and Physical    Mark Frey AST:[id number] DOB: 1956/06/29 DOA: 02/07/2019  PCP: [name], MD Patient coming from: Home  Chief Complaint: Weakness  HPI: Mark Frey is a 63 y.o. 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 History provided mostly by chart review and wife discussion as patient was not too interested in providing history. Patient presents with three weeks of worsening weakness. At baseline, he is able to ambulate independently with a walker but more recently was unable to stand up on his own. The patient and his wife went to his PCP on 5/26 to discuss decline and to assess capacity. Patient, at that time was considering hospice referral, but has now opted for acute medical treatment.   ED Course: Vitals: Temperature of 99 F, pulse of 110, respirations 18, blood pressure 104/74, SPO2 of 100% on room air. Labs: Sodium of 125, potassium of 3, CO2 of 19, glucose of 329, anion gap of 16, alkaline phosphatase of 141, lactic acid of 3.6, hemoglobin of 8.5, platelets of 128 Imaging: None Medications/Course: IV fluids, morphine  Review of Systems: Review of Systems  Constitutional: Negative for chills and fever.  Respiratory: Negative for cough, shortness of breath and wheezing.   Cardiovascular: Negative for chest pain.  Gastrointestinal: Negative for abdominal pain, constipation, diarrhea (Patient states none. Wife states high output from ostomy), nausea and vomiting.  Neurological: Positive for weakness.  All other systems reviewed and are negative.   Past Medical History:  Diagnosis Date  . ALLERGIC RHINITIS   . Cancer (Mark Frey)    Lymphoma   . Diabetes mellitus   . Hyperlipidemia     Past Surgical History:  Procedure Laterality Date  . BIOPSY  03/15/2018   Procedure: BIOPSY;  Surgeon: Milus Banister, MD;   Location: [name];  Service: Endoscopy;;  . BOWEL RESECTION N/A 08/25/2018   Procedure: LAPROSCOPIC LOOP ILEOSTOMY;  Surgeon: Greer Pickerel, MD;  Location: [facility];  Service: General;  Laterality: N/A;  . CLEFT PALATE REPAIR    . COLONOSCOPY N/A 03/15/2018   Procedure: COLONOSCOPY;  Surgeon: Milus Banister, MD;  Location: [facility];  Service: Endoscopy;  Laterality: N/A;  . COLONOSCOPY N/A 08/20/2018   Procedure: COLONOSCOPY;  Surgeon: Irene Shipper, MD;  Location: [facility];  Service: Endoscopy;  Laterality: N/A;  . deviated septum repair     slight improvement  . ESOPHAGOGASTRODUODENOSCOPY N/A 03/15/2018   Procedure: ESOPHAGOGASTRODUODENOSCOPY (EGD);  Surgeon: Milus Banister, MD;  Location: [name];  Service: Endoscopy;  Laterality: N/A;  . IR IMAGING GUIDED PORT INSERTION  03/17/2018  . LAPAROSCOPY N/A 08/25/2018   Procedure: LAPAROSCOPY DIAGNOSTIC;  Surgeon: Greer Pickerel, MD;  Location: [facility];  Service: General;  Laterality: N/A;  . TONSILLECTOMY       reports that he has never smoked. He has never used smokeless tobacco. He reports current alcohol use. He reports that he does not use drugs.  Allergies  Allergen Reactions  . Ciprofloxacin Other (See Comments)    Leg tingling    Family History  Problem Relation Age of Onset  . Lung cancer Mother        smoker  . Brain cancer Mother        metastasis  . AAA (abdominal aortic aneurysm) Father        smoker   Prior to Admission medications   Medication Sig Start Date  End Date Taking? Authorizing Provider  Amino Acids-Protein Hydrolys (FEEDING SUPPLEMENT, PRO-STAT SUGAR FREE 64,) LIQD Take 30 mLs by mouth 2 (two) times daily. 01/16/19   Desiree Hane, MD  apixaban (ELIQUIS) 5 MG TABS tablet DO NOT TAKE UNTIL SEEN BY YOUR ONCOLOGIST Patient taking differently: Take 5 mg by mouth daily. DO NOT TAKE UNTIL SEEN BY YOUR ONCOLOGIST 01/16/19   Oretha Milch D, MD  B Complex-C (B-COMPLEX WITH VITAMIN C) tablet Take 1 tablet  by mouth daily for 30 days. 01/17/19 02/16/19  Desiree Hane, MD  blood glucose meter kit and supplies Dispense based on patient and insurance preference. Use daily as directed. (E11.9). 03/31/18   [name], MD  dexamethasone (DECADRON) 4 MG tablet Take 1 tablet (4 mg total) by mouth daily with breakfast. 01/17/19   Oretha Milch D, MD  dronabinol (MARINOL) 5 MG capsule Take 1 capsule (5 mg total) by mouth 2 (two) times daily before lunch and supper. 01/16/19   Desiree Hane, MD  feeding supplement, ENSURE ENLIVE, (ENSURE ENLIVE) LIQD Take 237 mLs by mouth 2 (two) times daily between meals. 01/16/19   Oretha Milch D, MD  glucose blood (FREESTYLE TEST STRIPS) test strip Use to check blood sugar daily 03/30/18   [name], MD  Magnesium 500 MG CAPS Take 1 capsule (500 mg total) by mouth 2 (two) times daily. Patient taking differently: Take 500 mg by mouth 2 (two) times a day.  06/27/18   [name], MD  ondansetron (ZOFRAN) 4 MG tablet Take 1 tablet (4 mg total) by mouth every 6 (six) hours as needed for nausea. 01/16/19   Oretha Milch D, MD  zinc sulfate 220 (50 Zn) MG capsule Take 1 capsule (220 mg total) by mouth 2 (two) times daily for 30 days. 01/16/19 02/15/19  Desiree Hane, MD    Physical Exam:  Physical Exam Constitutional:      General: He is not in acute distress.    Appearance: He is well-developed. He is not diaphoretic.  Eyes:     Conjunctiva/sclera: Conjunctivae normal.     Pupils: Pupils are equal, round, and reactive to light.  Neck:     Musculoskeletal: Normal range of motion.  Cardiovascular:     Rate and Rhythm: Normal rate and regular rhythm.     Heart sounds: Normal heart sounds. No murmur.  Pulmonary:     Effort: Pulmonary effort is normal. No respiratory distress.     Breath sounds: Normal breath sounds. No wheezing or rales.  Abdominal:     General: Bowel sounds are normal. There is no distension.     Palpations: Abdomen is soft.      Tenderness: There is no abdominal tenderness. There is no guarding or rebound.  Musculoskeletal: Normal range of motion.        General: No tenderness.  Lymphadenopathy:     Cervical: No cervical adenopathy.  Skin:    General: Skin is warm and dry.  Neurological:     Mental Status: He is alert and oriented to person, place, and time.     Cranial Nerves: Cranial nerves are intact.     Motor: Weakness (4/5 LE strength bilaterally compared to 5/5 upper extremity strength) present.  Psychiatric:        Mood and Affect: Mood is depressed. Affect is flat.        Speech: Speech normal.        Behavior: Behavior is withdrawn.  Labs on Admission: I have personally reviewed following labs and imaging studies  CBC: Recent Labs  Lab 02/07/19 1014  WBC 6.3  NEUTROABS 5.4  HGB 8.5*  HCT 25.9*  MCV 94.9  PLT 128*    Basic Metabolic Panel: Recent Labs  Lab 02/07/19 1014  NA 125*  K 3.0*  CL 90*  CO2 19*  GLUCOSE 329*  BUN 19  CREATININE 0.67  CALCIUM 8.7*    GFR: Estimated Creatinine Clearance: 95.7 mL/min (by C-G formula based on SCr of 0.67 mg/dL).  Liver Function Tests: Recent Labs  Lab 02/07/19 1014  AST 15  ALT 30  ALKPHOS 141*  BILITOT 0.8  PROT 5.9*  ALBUMIN 2.5*   CBG: Recent Labs  Lab 02/07/19 0940  GLUCAP 349*   Urine analysis:    Component Value Date/Time   COLORURINE YELLOW 01/01/2019 1108   APPEARANCEUR HAZY (A) 01/01/2019 1108   LABSPEC 1.020 01/01/2019 1108   PHURINE 5.0 01/01/2019 1108   GLUCOSEU NEGATIVE 01/01/2019 1108   HGBUR NEGATIVE 01/01/2019 1108   HGBUR negative 01/20/2009 0840   BILIRUBINUR NEGATIVE 01/01/2019 1108   BILIRUBINUR Small 01/30/2018 1652   KETONESUR NEGATIVE 01/01/2019 1108   PROTEINUR 30 (A) 01/01/2019 1108   UROBILINOGEN 2.0 (A) 01/30/2018 1652   UROBILINOGEN 0.2 01/20/2009 0840   NITRITE NEGATIVE 01/01/2019 1108   LEUKOCYTESUR NEGATIVE 01/01/2019 1108    Radiological Exams on Admission: No results  found.  EKG: Independently reviewed. Sinus tachycardia  Assessment/Plan Active Problems:   Diabetes mellitus (H[facility])   Burkitt lymphoma, lymph nodes of multiple sites (HCC)   Hyponatremia   Malnutrition of moderate degree   Dehydration   Dehydration Secondary to poor oral intake. Contributing to generalized weakness Possibly contributed to by ostomy output, although wife states this is actually somewhat stable since discharge one month prior. Lactic acid of 3.6. -Give another 1L NS bolus, followed by NS @ 100 ml/hr -Repeat lactic acid -Encourage oral intake -PT eval  High anion gap metabolic acidosis Likely dehydration/lactic acidosis related. DKA in differential as well. Will treat with IV fluids for now. Significant hypokalemia, so would not start insulin anyway -BMP q6 hours  Confusion Mild. Likely secondary to metabolic encephalopathy in setting of dehydration. Patient with capacity.  Sinus tachycardia Secondary to dehydration. -Routine vitals  Hyponatremia Secondary to poor oral intake. Corrected sodium of 130.  -IV fluids as mentioned above -BMP q6 hours for now  Hypokalemia Given IV potassium in ED -BMP as mentioned above  Hypomagnesemia Takes supplementation as an outpatient. Magnesium stable. ?if contributing to diarrhea -Hold home magnesium for now -Repeat magnesium in AM  Diabetes mellitus, type 2 Patient was previously on Janumet which was discontinued a few weeks ago. Discussion on possible DKA as mentioned above. Last hemoglobin A1C of 6.0% -SSI qAC and qHS  History of diffuse colitis Somewhat persistent. No abdominal pain, however, has persistent mucous output from rectum. No fevers, elevated WBC. Low concern for infectious cause. Currently on dexamethasone daily -Continue Dexamethasone -Hydration as mentioned above -Watch ostomy output/strict in/out  History of pancytopenia Resolved. Persistent anemia and thrombocytopenia which are stable. -CBC  in AM  History of DVT/PE Recently decreased to Eliquis 2.5 mg BID per discussion with wife -Continue Eliquis 2.5 mg BID  Burkitt's lymphoma Patient is s/p radiation and chemotherapy which he has completed. Follows with [name].   History of moderate malnutrition Poor oral intake. -PCM consult -Dietitian consult   DVT prophylaxis: Eliquis Code Status: DNR Family Communication: Wife on telephone,  24 minutes discussing history and plan of care. Disposition Plan: Admit to medical floor Consults called: Palliative care medicine Admission status: Observation   Cordelia Poche, MD [facility] 02/07/2019, 11:17 AM  If 7PM-7AM, please contact night-coverage [email or web address] Password [email or web address]

## 2019-02-07 NOTE — ED Provider Notes (Signed)
[facility] DEPT Provider Note   CSN: [id number] Arrival date & time: 02/07/19  9892    History   Chief Complaint Chief Complaint  Patient presents with  . Weakness  . Ca Pt    HPI Mark Frey is a 63 y.o. male.     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  Admission April 20 tenderness.  No significant for diarrhea secondary to bradycardia and patient induced severe pancolitis with electrolyte derangements, neutropenic fever and pancytopenia discharged 5/5 who presents with increasing generalized weakness over the last 2 weeks.  Reports he has had continued weakness since leaving the hospital that has been  progressively worsening over the last 2 weeks.  Reports he has been unable to walk around his house due to severe generalized weakness, and has only been laying in bed.  He has had continued high ostomy output/diarrhea since he left the hospital, as well as continued decreased appetite.  Reports lower abdominal pain that is localized to his ostomy site.  Denies cough, fever, chest pain, shortness of breath, dysuria.  Reports he began taking the Eliquis again after had been held during his previous hospitalization.   Past Medical History:  Diagnosis Date  . ALLERGIC RHINITIS   . Cancer ([place])    Lymphoma   . Diabetes mellitus   . Hyperlipidemia     Patient Active Problem List   Diagnosis Date Noted  . Dehydration 02/07/2019  . Hypomagnesemia 01/11/2019  . Neutropenia ([place]) 01/11/2019  . Other neutropenia ([place])   . Pressure injury of skin 01/07/2019  . Zinc deficiency   . Malnutrition of moderate degree 01/04/2019  . Radiation gastroenteritis   . Radiation colitis   . Hyponatremia 01/01/2019  . Pancytopenia ([place]) 01/01/2019  . Hematochezia   . Abnormal CT of the abdomen   .  Severe protein-calorie malnutrition ([place]) 08/19/2018  . 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[place]) 08/19/2018  . Diverticulitis of colon   . Low magnesium level 07/30/2018  . Hypokalemia 07/21/2018  . Bowel obstruction in setting of lymphoma and sigmoid stricture 07/21/2018  . Diarrhea 07/21/2018  . Burkitt lymphoma, lymph nodes of multiple sites ([place]) 05/22/2018  . Diabetes mellitus ([place])   . Edema   . Deep vein thrombosis (DVT) of femoral vein of right lower extremity ([place])   . Counseling regarding advance care planning and goals of care 04/25/2018  . Gastrointestinal hemorrhage   . Encounter for antineoplastic chemotherapy   . 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[place]) 04/10/2018  . Port-A-Cath in place 03/27/2018  . Acute deep vein thrombosis (DVT) of femoral vein of right lower extremity ([place])   . 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 Bilateral pulmonary embolism ([place]) 03/10/2018  . Anemia 03/10/2018  . Hypoglycemia 03/10/2018  . Occult blood in stools 03/10/2018  . Abdominal mass 03/10/2018  . Tachycardia 01/31/2017  . Controlled diabetes mellitus type II without complication ([place]) 11/94/1740  . Hypertension 06/05/2014  . Irritable bowel syndrome 03/30/2010  . Morbid obesity ([place]) 12/29/2009  . Dysmetabolic syndrome X 81/44/8185  . BACK PAIN WITH RADICULOPATHY 06/09/2007  . Hyperlipidemia 05/12/2007  . ALLERGIC RHINITIS 05/12/2007    Past Surgical History:  Procedure Laterality Date  . BIOPSY  03/15/2018   Procedure: BIOPSY;  Surgeon: Milus Banister, MD;  Location: [facility] ENDOSCOPY;  Service: Endoscopy;;  . BOWEL RESECTION N/A 08/25/2018   Procedure: LAPROSCOPIC LOOP ILEOSTOMY;  Surgeon: Greer Pickerel, MD;  Location: [facility] ORS;  Service: General;  Laterality: N/A;  . CLEFT PALATE REPAIR    . COLONOSCOPY N/A 03/15/2018   Procedure: COLONOSCOPY;  Surgeon: Milus Banister, MD;  Location: [facility] ENDOSCOPY;  Service: Endoscopy;  Laterality: N/A;  . COLONOSCOPY N/A  08/20/2018   Procedure: COLONOSCOPY;  Surgeon: Irene Shipper, MD;  Location: [facility] ENDOSCOPY;  Service: Endoscopy;  Laterality: N/A;  . deviated septum repair     slight improvement  . ESOPHAGOGASTRODUODENOSCOPY N/A 03/15/2018   Procedure: ESOPHAGOGASTRODUODENOSCOPY (EGD);  Surgeon: Milus Banister, MD;  Location: [name];  Service: Endoscopy;  Laterality: N/A;  . IR IMAGING GUIDED PORT INSERTION  03/17/2018  . LAPAROSCOPY N/A 08/25/2018   Procedure: LAPAROSCOPY DIAGNOSTIC;  Surgeon: Greer Pickerel, MD;  Location: [facility];  Service: General;  Laterality: N/A;  . TONSILLECTOMY          Home Medications    Prior to Admission medications   Medication Sig Start Date End Date Taking? Authorizing Provider  apixaban (ELIQUIS) 5 MG TABS tablet DO NOT TAKE UNTIL SEEN BY YOUR ONCOLOGIST Patient taking differently: Take 2.5 mg by mouth 2 (two) times daily. DO NOT TAKE UNTIL SEEN BY YOUR ONCOLOGIST 01/16/19  Yes Oretha Milch D, MD  B Complex-C (B-COMPLEX WITH VITAMIN C) tablet Take 1 tablet by mouth daily for 30 days. 01/17/19 02/16/19 Yes Oretha Milch D, MD  dexamethasone (DECADRON) 4 MG tablet Take 1 tablet (4 mg total) by mouth daily with breakfast. 01/17/19  Yes Oretha Milch D, MD  dronabinol (MARINOL) 5 MG capsule Take 1 capsule (5 mg total) by mouth 2 (two) times daily before lunch and supper. 01/16/19  Yes Oretha Milch D, MD  Magnesium 500 MG CAPS Take 1 capsule (500 mg total) by mouth 2 (two) times daily. Patient taking differently: Take 500 mg by mouth 2 (two) times a day.  06/27/18  Yes [name], MD  ondansetron (ZOFRAN) 4 MG tablet Take 1 tablet (4 mg total) by mouth every 6 (six) hours as needed for nausea. 01/16/19  Yes Oretha Milch D, MD  zinc sulfate 220 (50 Zn) MG capsule Take 1 capsule (220 mg total) by mouth 2 (two) times daily for 30 days. 01/16/19 02/15/19 Yes Oretha Milch D, MD  Amino Acids-Protein Hydrolys (FEEDING SUPPLEMENT, PRO-STAT SUGAR FREE 64,) LIQD Take 30 mLs by  mouth 2 (two) times daily. Patient not taking: Reported on 02/07/2019 01/16/19   Desiree Hane, MD  blood glucose meter kit and supplies Dispense based on patient and insurance preference. Use daily as directed. (E11.9). 03/31/18   [name], MD  feeding supplement, ENSURE ENLIVE, (ENSURE ENLIVE) LIQD Take 237 mLs by mouth 2 (two) times daily between meals. Patient not taking: Reported on 02/07/2019 01/16/19   Oretha Milch D, MD  glucose blood (FREESTYLE TEST STRIPS) test strip Use to check blood sugar daily 03/30/18   [name], MD    Family History Family History  Problem Relation Age of Onset  . Lung cancer Mother        smoker  . Brain cancer Mother        metastasis  . AAA (abdominal aortic aneurysm) Father        smoker    Social History Social History   Tobacco Use  . Smoking status: Never Smoker  . Smokeless tobacco: Never Used  Substance Use Topics  .  Alcohol use: Yes    Comment: occasional  . Drug use: No     Allergies   Ciprofloxacin   Review of Systems Review of Systems  Constitutional: Positive for activity change, appetite change and fatigue. Negative for fever.  HENT: Negative for sore throat.   Eyes: Negative for visual disturbance.  Respiratory: Negative for cough and shortness of breath.   Cardiovascular: Negative for chest pain.  Gastrointestinal: Positive for diarrhea and nausea. Negative for abdominal pain (at ostomy site, no other) and vomiting.  Genitourinary: Negative for difficulty urinating.  Musculoskeletal: Negative for back pain and neck stiffness.  Skin: Negative for rash.  Neurological: Negative for syncope and headaches.     Physical Exam Updated Vital Signs BP 110/68 (BP Location: Left Arm)   Pulse (!) 108   Temp 98.6 F (37 C) (Oral)   Resp 18   SpO2 98%   Physical Exam Vitals signs and nursing note reviewed.  Constitutional:      General: He is not in acute distress.    Appearance: He is well-developed. He  is ill-appearing. He is not diaphoretic.     Comments: Generally weak  HENT:     Head: Normocephalic and atraumatic.  Eyes:     Conjunctiva/sclera: Conjunctivae normal.  Neck:     Musculoskeletal: Normal range of motion.  Cardiovascular:     Rate and Rhythm: Normal rate and regular rhythm.  Pulmonary:     Effort: Pulmonary effort is normal. No respiratory distress.     Breath sounds: Normal breath sounds.  Abdominal:     General: There is no distension.     Palpations: Abdomen is soft.     Tenderness: There is no abdominal tenderness. There is no guarding.     Comments: ostomy  Skin:    General: Skin is warm and dry.  Neurological:     Mental Status: He is alert and oriented to person, place, and time.      ED Treatments / Results  Labs (all labs ordered are listed, but only abnormal results are displayed) Labs Reviewed  CBC WITH DIFFERENTIAL/PLATELET - Abnormal; Notable for the following components:      Result Value   RBC 2.73 (*)    Hemoglobin 8.5 (*)    HCT 25.9 (*)    RDW 20.5 (*)    Platelets 128 (*)    Lymphs Abs 0.2 (*)    Abs Immature Granulocytes 0.40 (*)    All other components within normal limits  COMPREHENSIVE METABOLIC PANEL - Abnormal; Notable for the following components:   Sodium 125 (*)    Potassium 3.0 (*)    Chloride 90 (*)    CO2 19 (*)    Glucose, Bld 329 (*)    Calcium 8.7 (*)    Total Protein 5.9 (*)    Albumin 2.5 (*)    Alkaline Phosphatase 141 (*)    Anion gap 16 (*)    All other components within normal limits  URINALYSIS, ROUTINE W REFLEX MICROSCOPIC - Abnormal; Notable for the following components:   Glucose, UA 50 (*)    Protein, ur 30 (*)    All other components within normal limits  LACTIC ACID, PLASMA - Abnormal; Notable for the following components:   Lactic Acid, Venous 3.6 (*)    All other components within normal limits  LACTIC ACID, PLASMA - Abnormal; Notable for the following components:   Lactic Acid, Venous 2.4 (*)     All other components within normal  limits  BLOOD GAS, VENOUS - Abnormal; Notable for the following components:   pCO2, Ven 37.7 (*)    All other components within normal limits  BASIC METABOLIC PANEL - Abnormal; Notable for the following components:   Sodium 127 (*)    Potassium 3.4 (*)    Chloride 95 (*)    Glucose, Bld 185 (*)    Calcium 8.4 (*)    All other components within normal limits  GLUCOSE, CAPILLARY - Abnormal; Notable for the following components:   Glucose-Capillary 150 (*)    All other components within normal limits  CBG MONITORING, ED - Abnormal; Notable for the following components:   Glucose-Capillary 349 (*)    All other components within normal limits  SARS CORONAVIRUS 2 (HOSPITAL ORDER, [facility])  CULTURE, BLOOD (ROUTINE X 2)  CULTURE, BLOOD (ROUTINE X 2)  URINE CULTURE  MAGNESIUM  BETA-HYDROXYBUTYRIC ACID  BASIC METABOLIC PANEL  BASIC METABOLIC PANEL  CBC    EKG None  Radiology No results found.  Procedures .Critical Care Performed by: Gareth Morgan, MD Authorized by: Gareth Morgan, MD   Critical care provider statement:    Critical care time (minutes):  30   Critical care was necessary to treat or prevent imminent or life-threatening deterioration of the following conditions:  Metabolic crisis   Critical care was time spent personally by me on the following activities:  Examination of patient, ordering and performing treatments and interventions, ordering and review of laboratory studies, ordering and review of radiographic studies, pulse oximetry, re-evaluation of patient's condition, obtaining history from patient or surrogate and review of old charts   (including critical care time)  Medications Ordered in ED Medications  potassium chloride 10 mEq in 100 mL IVPB (10 mEq Intravenous New Bag/Given 02/07/19 1528)  sodium chloride 0.9 % bolus 1,000 mL (has no administration in time range)    Followed by   0.9 %  sodium chloride infusion ( Intravenous Rate/Dose Verify 02/07/19 1631)  dexamethasone (DECADRON) tablet 4 mg (4 mg Oral Not Given 02/07/19 1541)  apixaban (ELIQUIS) tablet 2.5 mg (2.5 mg Oral Not Given 02/07/19 1541)  feeding supplement (ENSURE ENLIVE) (ENSURE ENLIVE) liquid 237 mL (237 mLs Oral Given 02/07/19 1542)  acetaminophen (TYLENOL) tablet 650 mg (has no administration in time range)    Or  acetaminophen (TYLENOL) suppository 650 mg (has no administration in time range)  HYDROcodone-acetaminophen (NORCO/VICODIN) 5-325 MG per tablet 1-2 tablet (has no administration in time range)  ondansetron (ZOFRAN) tablet 4 mg (has no administration in time range)    Or  ondansetron (ZOFRAN) injection 4 mg (has no administration in time range)  insulin aspart (novoLOG) injection 0-9 Units (1 Units Subcutaneous Given 02/07/19 1738)  sodium chloride flush (NS) 0.9 % injection 10-40 mL (10 mLs Intracatheter Given 02/07/19 1741)  sodium chloride 0.9 % bolus 1,000 mL (0 mLs Intravenous Stopped 02/07/19 1143)  morphine 4 MG/ML injection 4 mg (4 mg Intravenous Given 02/07/19 1018)     Initial Impression / Assessment and Plan / ED Course  I have reviewed the triage vital signs and the nursing notes.  Pertinent labs & imaging results that were available during my care of the patient were reviewed by me and considered in my medical decision making (see chart for details).        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  Admission April 20 tenderness.  No significant for diarrhea secondary to bradycardia and patient induced severe pancolitis with electrolyte derangements, neutropenic fever and pancytopenia discharged 5/5 who presents with increasing generalized weakness over the last 2 weeks.  Patient arrives with generalized weakness, tachycardia, temperature of  99 on arrival.  By history, I suspect most likely dehydration in the setting of continued high ostomy output secondary to radiation colitis previously diagnosed, although given recent fevers in the hospital and hx of pancytopenia will also order blood cx for further evaluation of symptoms.  He does not have abdominal tenderness, doubt acute surgical pathology as etiology of symptoms.   Labs returned showing hyponatremia, hypokalemia, hyperglycemia, mild metabolic acidosis.  Suspect presentation most likely secondary to dehydration secondary to high ostomy output and decreased appetite. Low suspicion for sepsis.  Metabolic acidosis suspect likely secondary to dehydration/lactate however at time of call for admission lactic acid, UA, VBG and beta hydroxybutyric acid pending to evaluate for possible mild DKA as etiology.   Given IV fluids, K and admitted for further care.   Final Clinical Impressions(s) / ED Diagnoses   Final diagnoses:  Hyponatremia  Hypokalemia  Dehydration  Radiation colitis    ED Discharge Orders    None       Gareth Morgan, MD 02/07/19 2141

## 2019-02-07 NOTE — ED Notes (Signed)
Attempted to call RN was not able to take report at the  Time

## 2019-02-07 NOTE — Telephone Encounter (Signed)
Contacted wife this AM to ask when she could bring husband in to [facility] today so that appts could be set up for labs and fluids as directed by [name]. Ms. Cleavenger states she appreciated Dr. Grier Mitts plan for appts at [facility] today with lab and [facility]. Wife states patient is too weak for her to bring to [facility], she cannot assist him in ambulation.  She said Dr. Yong Channel, PCP, spoke with her and patient yesterday afternoon. She states she and husband have discussed his current need for care and that they agreed that she would call EMS this morning to bring patient to hospital.  Ms. Burnham asked that [name] is notified of patient's transport to hospital this morning.

## 2019-02-07 NOTE — ED Notes (Signed)
ED TO INPATIENT HANDOFF REPORT  ED Nurse Name and Phone #: [phone]  S Name/Age/Gender Mark Frey 63 y.o. male Room/Bed: WA23/WA23  Code Status   Code Status: Prior  Home/SNF/Other Home Patient oriented to: self, place, time and situation Is this baseline? Yes   Triage Complete: Triage complete  Chief Complaint unknown  Triage Note Pt arrived via EMS from home. Pt has hx of Lymphoma. Pt was radiation colitis , has colostomy bag, was recent hospitalization. Pt is now c/o increased weakness x 2 weeks had been able to walk and is no longer able to and increasing lethargy.     HR 120, BP 114/78, CBG 382    Allergies Allergies  Allergen Reactions  . Ciprofloxacin Other (See Comments)    Leg tingling    Level of Care/Admitting Diagnosis ED Disposition    ED Disposition Condition Comment   Admit  Hospital Area: [facility] [[id number]]  Level of Care: Med-Surg [16]  Covid Evaluation: Screening Protocol (No Symptoms)  Diagnosis: Dehydration [276.51.ICD-9-CM]  Admitting Physician: Mariel Aloe [[id number]]  Attending Physician: Mariel Aloe [phone]  PT Class (Do Not Modify): Observation [104]  PT Acc Code (Do Not Modify): Observation [10022]       B Medical/Surgery History Past Medical History:  Diagnosis Date  . ALLERGIC RHINITIS   . Cancer ([place])    Lymphoma   . Diabetes mellitus   . Hyperlipidemia    Past Surgical History:  Procedure Laterality Date  . BIOPSY  03/15/2018   Procedure: BIOPSY;  Surgeon: Milus Banister, MD;  Location[name];  Service: Endoscopy;;  . BOWEL RESECTION N/A 08/25/2018   Procedure: LAPROSCOPIC LOOP ILEOSTOMY;  Surgeon: Greer Pickerel, MD;  Location: [facility];  Service: General;  Laterality: N/A;  . CLEFT PALATE REPAIR    . COLONOSCOPY N/A 03/15/2018   Procedure: COLONOSCOPY;  Surgeon: Milus Banister, MD;  Location: [facility];  Service: Endoscopy;  Laterality: N/A;  . COLONOSCOPY N/A 08/20/2018    Procedure: COLONOSCOPY;  Surgeon: Irene Shipper, MD;  Location: [facility];  Service: Endoscopy;  Laterality: N/A;  . deviated septum repair     slight improvement  . ESOPHAGOGASTRODUODENOSCOPY N/A 03/15/2018   Procedure: ESOPHAGOGASTRODUODENOSCOPY (EGD);  Surgeon: Milus Banister, MD;  Location: [name];  Service: Endoscopy;  Laterality: N/A;  . IR IMAGING GUIDED PORT INSERTION  03/17/2018  . LAPAROSCOPY N/A 08/25/2018   Procedure: LAPAROSCOPY DIAGNOSTIC;  Surgeon: Greer Pickerel, MD;  Location: [facility];  Service: General;  Laterality: N/A;  . TONSILLECTOMY       A IV Location/Drains/Wounds Patient Lines/Drains/Airways Status   Active Line/Drains/Airways    Name:   Placement date:   Placement time:   Site:   Days:   Implanted Port 03/27/18 Right Chest   03/27/18    1141    Chest   317   Peripheral IV 02/07/19 Left Wrist   02/07/19    1017    Wrist   less than 1   Ileostomy Loop RUQ   08/25/18    1138    RUQ   166   Incision (Closed) 08/25/18 Abdomen Other (Comment)   08/25/18    1120     166   Incision - 3 Ports Abdomen Umbilicus Left;Upper Left   08/25/18    1115     166   Pressure Injury 01/04/19 Stage II -  Partial thickness loss of dermis presenting as a shallow open ulcer with a red, pink  wound bed without slough. redness around wound bed   01/04/19    0930     34          Intake/Output Last 24 hours  Intake/Output Summary (Last 24 hours) at 02/07/2019 1259 Last data filed at 02/07/2019 1143 Gross per 24 hour  Intake 1000 ml  Output -  Net 1000 ml    Labs/Imaging Results for orders placed or performed during the hospital encounter of 02/07/19 (from the past 48 hour(s))  CBG monitoring, ED     Status: Abnormal   Collection Time: 02/07/19  9:40 AM  Result Value Ref Range   Glucose-Capillary 349 (H) 70 - 99 mg/dL  CBC with Differential     Status: Abnormal   Collection Time: 02/07/19 10:14 AM  Result Value Ref Range   WBC 6.3 4.0 - 10.5 K/uL    Comment: WHITE COUNT  CONFIRMED ON SMEAR   RBC 2.73 (L) 4.22 - 5.81 MIL/uL   Hemoglobin 8.5 (L) 13.0 - 17.0 g/dL   HCT 25.9 (L) 39.0 - 52.0 %   MCV 94.9 80.0 - 100.0 fL   MCH 31.1 26.0 - 34.0 pg   MCHC 32.8 30.0 - 36.0 g/dL   RDW 20.5 (H) 11.5 - 15.5 %   Platelets 128 (L) 150 - 400 K/uL   nRBC 0.0 0.0 - 0.2 %   Neutrophils Relative % 60 %   Neutro Abs 5.4 1.7 - 7.7 K/uL   Band Neutrophils 25 %   Lymphocytes Relative 3 %   Lymphs Abs 0.2 (L) 0.7 - 4.0 K/uL   Monocytes Relative 6 %   Monocytes Absolute 0.4 0.1 - 1.0 K/uL   Eosinophils Relative 0 %   Eosinophils Absolute 0.0 0.0 - 0.5 K/uL   Basophils Relative 0 %   Basophils Absolute 0.0 0.0 - 0.1 K/uL   WBC Morphology DOHLE BODIES     Comment: INCREASED BANDS (>20% BANDS) MILD LEFT SHIFT (1-5% METAS, OCC MYELO, OCC BANDS) TOXIC GRANULATION    Metamyelocytes Relative 1 %   Myelocytes 5 %   Abs Immature Granulocytes 0.40 (H) 0.00 - 0.07 K/uL   Polychromasia PRESENT     Comment: Performed at [facility], [place] [address], [place], [place] [address]  Comprehensive metabolic panel     Status: Abnormal   Collection Time: 02/07/19 10:14 AM  Result Value Ref Range   Sodium 125 (L) 135 - 145 mmol/L   Potassium 3.0 (L) 3.5 - 5.1 mmol/L   Chloride 90 (L) 98 - 111 mmol/L   CO2 19 (L) 22 - 32 mmol/L   Glucose, Bld 329 (H) 70 - 99 mg/dL   BUN 19 8 - 23 mg/dL   Creatinine, Ser 0.67 0.61 - 1.24 mg/dL   Calcium 8.7 (L) 8.9 - 10.3 mg/dL   Total Protein 5.9 (L) 6.5 - 8.1 g/dL   Albumin 2.5 (L) 3.5 - 5.0 g/dL   AST 15 15 - 41 U/L   ALT 30 0 - 44 U/L   Alkaline Phosphatase 141 (H) 38 - 126 U/L   Total Bilirubin 0.8 0.3 - 1.2 mg/dL   GFR calc non Af Amer >60 >60 mL/min   GFR calc Af Amer >60 >60 mL/min   Anion gap 16 (H) 5 - 15    Comment: Performed at [facility], [place] [address], [place], [place] [address]  Lactic acid, plasma     Status: Abnormal   Collection Time: 02/07/19 10:14 AM  Result Value  Ref Range   Lactic  Acid, Venous 3.6 (HH) 0.5 - 1.9 mmol/L    Comment: CRITICAL RESULT CALLED TO, READ BACK BY AND VERIFIED WITHSharlyn Bologna RN AT 1118 02/07/19 MULLINS,T Performed at [facility], [place] [address], [place], [place] [address]   SARS Coronavirus 2 (CEPHEID - Performed in [place] hospital lab), Hosp Order     Status: None   Collection Time: 02/07/19 10:14 AM  Result Value Ref Range   SARS Coronavirus 2 NEGATIVE NEGATIVE    Comment: (NOTE) If result is NEGATIVE SARS-CoV-2 target nucleic acids are NOT DETECTED. The SARS-CoV-2 RNA is generally detectable in upper and lower  respiratory specimens during the acute phase of infection. The lowest  concentration of SARS-CoV-2 viral copies this assay can detect is 250  copies / mL. A negative result does not preclude SARS-CoV-2 infection  and should not be used as the sole basis for treatment or other  patient management decisions.  A negative result may occur with  improper specimen collection / handling, submission of specimen other  than nasopharyngeal swab, presence of viral mutation(s) within the  areas targeted by this assay, and inadequate number of viral copies  (<250 copies / mL). A negative result must be combined with clinical  observations, patient history, and epidemiological information. If result is POSITIVE SARS-CoV-2 target nucleic acids are DETECTED. The SARS-CoV-2 RNA is generally detectable in upper and lower  respiratory specimens dur ing the acute phase of infection.  Positive  results are indicative of active infection with SARS-CoV-2.  Clinical  correlation with patient history and other diagnostic information is  necessary to determine patient infection status.  Positive results do  not rule out bacterial infection or co-infection with other viruses. If result is PRESUMPTIVE POSTIVE SARS-CoV-2 nucleic acids MAY BE PRESENT.   A presumptive positive result was obtained on the submitted specimen  and  confirmed on repeat testing.  While 2019 novel coronavirus  (SARS-CoV-2) nucleic acids may be present in the submitted sample  additional confirmatory testing may be necessary for epidemiological  and / or clinical management purposes  to differentiate between  SARS-CoV-2 and other Sarbecovirus currently known to infect humans.  If clinically indicated additional testing with an alternate test  methodology [phone]) is advised. The SARS-CoV-2 RNA is generally  detectable in upper and lower respiratory sp ecimens during the acute  phase of infection. The expected result is Negative. Fact Sheet for Patients:  StrictlyIdeas.no Fact Sheet for Healthcare Providers: BankingDealers.co.za This test is not yet approved or cleared by the [place] FDA and has been authorized for detection and/or diagnosis of SARS-CoV-2 by FDA under an Emergency Use Authorization (EUA).  This EUA will remain in effect (meaning this test can be used) for the duration of the COVID-19 declaration under Section 564(b)(1) of the Act, 21 U.S.C. section 360bbb-3(b)(1), unless the authorization is terminated or revoked sooner. Performed at [facility], [place] [address], [place], [place] [address]   Magnesium     Status: None   Collection Time: 02/07/19 10:14 AM  Result Value Ref Range   Magnesium 1.9 1.7 - 2.4 mg/dL    Comment: Performed at [facility], [place] [address], [place], [place] [address]  Beta-hydroxybutyric acid     Status: None   Collection Time: 02/07/19 10:14 AM  Result Value Ref Range   Beta-Hydroxybutyric Acid 0.05 0.05 - 0.27 mmol/L    Comment: Performed at [facility], [place] [employer] [place]  [address] [place], [place] [address]   No results found.  Pending Labs Unresulted Labs (From admission, onward)    Start     Ordered   02/07/19 1136  Blood gas, venous (at [facility] and [facility], not at [facility])  ONCE - STAT,   STAT      02/07/19 1135   02/07/19 0952  Blood culture (routine x 2)  BLOOD CULTURE X 2,   STAT     02/07/19 0952   02/07/19 0952  Urinalysis, Routine w reflex microscopic  Once,   R     02/07/19 0952   02/07/19 0952  Urine culture  ONCE - STAT,   STAT     02/07/19 0952   02/07/19 0952  Lactic acid, plasma  Now then every 2 hours,   STAT     02/07/19 7445   Signed and Held  Basic metabolic panel  Now then every 6 hours,   R     Signed and Held   Signed and Held  CBC  Tomorrow morning,   R     Signed and Held          Vitals/Pain Today's Vitals   02/07/19 0942 02/07/19 0945 02/07/19 1130  BP: 103/79 104/74   Pulse: (!) 119 (!) 117 (!) 110  Resp: 16 16 18   Temp: 99 F (37.2 C)    TempSrc: Oral    SpO2: 100% 99% 100%    Isolation Precautions No active isolations  Medications Medications  potassium chloride 10 mEq in 100 mL IVPB (10 mEq Intravenous New Bag/Given 02/07/19 1221)  sodium chloride 0.9 % bolus 1,000 mL (has no administration in time range)    Followed by  0.9 %  sodium chloride infusion (has no administration in time range)  insulin aspart (novoLOG) injection 0-9 Units (has no administration in time range)  sodium chloride 0.9 % bolus 1,000 mL (0 mLs Intravenous Stopped 02/07/19 1143)  morphine 4 MG/ML injection 4 mg (4 mg Intravenous Given 02/07/19 1018)    Mobility non-ambulatory Moderate fall risk   Focused Assessments    R Recommendations: See Admitting Provider Note  Report given to:   Additional Notes:

## 2019-02-07 NOTE — Progress Notes (Signed)
PMT no charge note  PMT consult request received. Chart reviewed.   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 He has been admitted to hospital medicine service today with worsening weakness, inability to stand/walk,dehydration, electrolyte abnormalities, high output from ostomy and anion gap acidosis.   A palliative consult has been requested for goals of care discussions.   The patient has just arrived from ED, resting in bed with eyes closed, does not arouse to gentle voice stimulation, wife not at bedside due to COVID-19 visitor restrictions in effect.   Will re attempt in am on 02-08-2019 and also discuss with wife on the phone. Full note and final recommendations to follow. Thank you for the consult.    Loistine Chance MD [facility] health palliative medicine team [phone]

## 2019-02-08 DIAGNOSIS — K520 Gastroenteritis and colitis due to radiation: Secondary | ICD-10-CM

## 2019-02-08 DIAGNOSIS — Z20828 Contact with and (suspected) exposure to other viral communicable diseases: Secondary | ICD-10-CM | POA: Diagnosis present

## 2019-02-08 DIAGNOSIS — E43 Unspecified severe protein-calorie malnutrition: Secondary | ICD-10-CM | POA: Diagnosis present

## 2019-02-08 DIAGNOSIS — F329 Major depressive disorder, single episode, unspecified: Secondary | ICD-10-CM | POA: Diagnosis not present

## 2019-02-08 DIAGNOSIS — E11649 Type 2 diabetes mellitus with hypoglycemia without coma: Secondary | ICD-10-CM | POA: Diagnosis not present

## 2019-02-08 DIAGNOSIS — R1084 Generalized abdominal pain: Secondary | ICD-10-CM | POA: Diagnosis not present

## 2019-02-08 DIAGNOSIS — Z801 Family history of malignant neoplasm of trachea, bronchus and lung: Secondary | ICD-10-CM | POA: Diagnosis not present

## 2019-02-08 DIAGNOSIS — N200 Calculus of kidney: Secondary | ICD-10-CM | POA: Diagnosis not present

## 2019-02-08 DIAGNOSIS — G9341 Metabolic encephalopathy: Secondary | ICD-10-CM | POA: Diagnosis present

## 2019-02-08 DIAGNOSIS — E871 Hypo-osmolality and hyponatremia: Secondary | ICD-10-CM | POA: Diagnosis not present

## 2019-02-08 DIAGNOSIS — K6389 Other specified diseases of intestine: Secondary | ICD-10-CM | POA: Diagnosis not present

## 2019-02-08 DIAGNOSIS — D649 Anemia, unspecified: Secondary | ICD-10-CM

## 2019-02-08 DIAGNOSIS — D6959 Other secondary thrombocytopenia: Secondary | ICD-10-CM | POA: Diagnosis not present

## 2019-02-08 DIAGNOSIS — K56609 Unspecified intestinal obstruction, unspecified as to partial versus complete obstruction: Secondary | ICD-10-CM | POA: Diagnosis not present

## 2019-02-08 DIAGNOSIS — E872 Acidosis: Secondary | ICD-10-CM | POA: Diagnosis present

## 2019-02-08 DIAGNOSIS — K633 Ulcer of intestine: Secondary | ICD-10-CM | POA: Diagnosis not present

## 2019-02-08 DIAGNOSIS — Z515 Encounter for palliative care: Secondary | ICD-10-CM

## 2019-02-08 DIAGNOSIS — R109 Unspecified abdominal pain: Secondary | ICD-10-CM | POA: Diagnosis not present

## 2019-02-08 DIAGNOSIS — R531 Weakness: Secondary | ICD-10-CM | POA: Diagnosis present

## 2019-02-08 DIAGNOSIS — C8378 Burkitt lymphoma, lymph nodes of multiple sites: Secondary | ICD-10-CM

## 2019-02-08 DIAGNOSIS — E860 Dehydration: Secondary | ICD-10-CM | POA: Diagnosis not present

## 2019-02-08 DIAGNOSIS — K5100 Ulcerative (chronic) pancolitis without complications: Secondary | ICD-10-CM | POA: Diagnosis not present

## 2019-02-08 DIAGNOSIS — Z923 Personal history of irradiation: Secondary | ICD-10-CM | POA: Diagnosis not present

## 2019-02-08 DIAGNOSIS — K651 Peritoneal abscess: Secondary | ICD-10-CM | POA: Diagnosis not present

## 2019-02-08 DIAGNOSIS — K5732 Diverticulitis of large intestine without perforation or abscess without bleeding: Secondary | ICD-10-CM | POA: Diagnosis not present

## 2019-02-08 DIAGNOSIS — M2550 Pain in unspecified joint: Secondary | ICD-10-CM | POA: Diagnosis not present

## 2019-02-08 DIAGNOSIS — R1030 Lower abdominal pain, unspecified: Secondary | ICD-10-CM | POA: Diagnosis not present

## 2019-02-08 DIAGNOSIS — E876 Hypokalemia: Secondary | ICD-10-CM | POA: Diagnosis not present

## 2019-02-08 DIAGNOSIS — C8370 Burkitt lymphoma, unspecified site: Secondary | ICD-10-CM | POA: Diagnosis not present

## 2019-02-08 DIAGNOSIS — Z86711 Personal history of pulmonary embolism: Secondary | ICD-10-CM | POA: Diagnosis not present

## 2019-02-08 DIAGNOSIS — K56699 Other intestinal obstruction unspecified as to partial versus complete obstruction: Secondary | ICD-10-CM | POA: Diagnosis not present

## 2019-02-08 DIAGNOSIS — E785 Hyperlipidemia, unspecified: Secondary | ICD-10-CM | POA: Diagnosis present

## 2019-02-08 DIAGNOSIS — D696 Thrombocytopenia, unspecified: Secondary | ICD-10-CM | POA: Diagnosis not present

## 2019-02-08 DIAGNOSIS — Z7189 Other specified counseling: Secondary | ICD-10-CM | POA: Diagnosis not present

## 2019-02-08 DIAGNOSIS — Y842 Radiological procedure and radiotherapy as the cause of abnormal reaction of the patient, or of later complication, without mention of misadventure at the time of the procedure: Secondary | ICD-10-CM | POA: Diagnosis present

## 2019-02-08 DIAGNOSIS — E440 Moderate protein-calorie malnutrition: Secondary | ICD-10-CM

## 2019-02-08 DIAGNOSIS — D61818 Other pancytopenia: Secondary | ICD-10-CM | POA: Diagnosis not present

## 2019-02-08 DIAGNOSIS — R188 Other ascites: Secondary | ICD-10-CM | POA: Diagnosis not present

## 2019-02-08 DIAGNOSIS — Z86718 Personal history of other venous thrombosis and embolism: Secondary | ICD-10-CM | POA: Diagnosis not present

## 2019-02-08 DIAGNOSIS — M6259 Muscle wasting and atrophy, not elsewhere classified, multiple sites: Secondary | ICD-10-CM | POA: Diagnosis not present

## 2019-02-08 DIAGNOSIS — R1032 Left lower quadrant pain: Secondary | ICD-10-CM | POA: Diagnosis not present

## 2019-02-08 DIAGNOSIS — R933 Abnormal findings on diagnostic imaging of other parts of digestive tract: Secondary | ICD-10-CM | POA: Diagnosis not present

## 2019-02-08 DIAGNOSIS — C8510 Unspecified B-cell lymphoma, unspecified site: Secondary | ICD-10-CM | POA: Diagnosis not present

## 2019-02-08 DIAGNOSIS — R627 Adult failure to thrive: Secondary | ICD-10-CM | POA: Diagnosis present

## 2019-02-08 DIAGNOSIS — K529 Noninfective gastroenteritis and colitis, unspecified: Secondary | ICD-10-CM | POA: Diagnosis not present

## 2019-02-08 DIAGNOSIS — Z7401 Bed confinement status: Secondary | ICD-10-CM | POA: Diagnosis not present

## 2019-02-08 DIAGNOSIS — Z881 Allergy status to other antibiotic agents status: Secondary | ICD-10-CM | POA: Diagnosis not present

## 2019-02-08 DIAGNOSIS — D630 Anemia in neoplastic disease: Secondary | ICD-10-CM | POA: Diagnosis not present

## 2019-02-08 DIAGNOSIS — Z7901 Long term (current) use of anticoagulants: Secondary | ICD-10-CM | POA: Diagnosis not present

## 2019-02-08 DIAGNOSIS — Z66 Do not resuscitate: Secondary | ICD-10-CM | POA: Diagnosis present

## 2019-02-08 DIAGNOSIS — K630 Abscess of intestine: Secondary | ICD-10-CM | POA: Diagnosis not present

## 2019-02-08 DIAGNOSIS — I82411 Acute embolism and thrombosis of right femoral vein: Secondary | ICD-10-CM | POA: Diagnosis not present

## 2019-02-08 DIAGNOSIS — K6289 Other specified diseases of anus and rectum: Secondary | ICD-10-CM | POA: Diagnosis not present

## 2019-02-08 DIAGNOSIS — Z808 Family history of malignant neoplasm of other organs or systems: Secondary | ICD-10-CM | POA: Diagnosis not present

## 2019-02-08 LAB — BASIC METABOLIC PANEL
Anion gap: 8 (ref 5–15)
Anion gap: 11 (ref 5–15)
Anion gap: 8 (ref 5–15)
BUN: 11 mg/dL (ref 8–23)
BUN: 13 mg/dL (ref 8–23)
BUN: 11 mg/dL (ref 8–23)
CO2: 21 mmol/L — ABNORMAL LOW (ref 22–32)
CO2: 19 mmol/L — ABNORMAL LOW (ref 22–32)
CO2: 22 mmol/L (ref 22–32)
Calcium: 8.2 mg/dL — ABNORMAL LOW (ref 8.9–10.3)
Calcium: 8.3 mg/dL — ABNORMAL LOW (ref 8.9–10.3)
Calcium: 8.1 mg/dL — ABNORMAL LOW (ref 8.9–10.3)
Chloride: 98 mmol/L (ref 98–111)
Chloride: 97 mmol/L — ABNORMAL LOW (ref 98–111)
Chloride: 95 mmol/L — ABNORMAL LOW (ref 98–111)
Creatinine, Ser: 0.51 mg/dL — ABNORMAL LOW (ref 0.61–1.24)
Creatinine, Ser: 0.71 mg/dL (ref 0.61–1.24)
Creatinine, Ser: 0.53 mg/dL — ABNORMAL LOW (ref 0.61–1.24)
GFR calc Af Amer: >60 mL/min (ref >60)
GFR calc Af Amer: >60 mL/min (ref >60)
GFR calc Af Amer: >60 mL/min (ref >60)
GFR calc non Af Amer: >60 mL/min (ref >60)
GFR calc non Af Amer: >60 mL/min (ref >60)
GFR calc non Af Amer: >60 mL/min (ref >60)
Glucose, Bld: 174 mg/dL — ABNORMAL HIGH (ref 70–99)
Glucose, Bld: 183 mg/dL — ABNORMAL HIGH (ref 70–99)
Glucose, Bld: 191 mg/dL — ABNORMAL HIGH (ref 70–99)
Potassium: 3.2 mmol/L — ABNORMAL LOW (ref 3.5–5.1)
Potassium: 3.5 mmol/L (ref 3.5–5.1)
Potassium: 3.7 mmol/L (ref 3.5–5.1)
Sodium: 127 mmol/L — ABNORMAL LOW (ref 135–145)
Sodium: 127 mmol/L — ABNORMAL LOW (ref 135–145)
Sodium: 125 mmol/L — ABNORMAL LOW (ref 135–145)

## 2019-02-08 LAB — CBC
HCT: 22.4 % — ABNORMAL LOW (ref 39.0–52.0)
Hemoglobin: 7 g/dL — ABNORMAL LOW (ref 13.0–17.0)
MCH: 30.4 pg (ref 26.0–34.0)
MCHC: 31.3 g/dL (ref 30.0–36.0)
MCV: 97.4 fL (ref 80.0–100.0)
Platelets: 131 10*3/uL — ABNORMAL LOW (ref 150–400)
RBC: 2.3 MIL/uL — ABNORMAL LOW (ref 4.22–5.81)
RDW: 20.7 % — ABNORMAL HIGH (ref 11.5–15.5)
WBC: 5.6 10*3/uL (ref 4.0–10.5)
nRBC: 0 % (ref 0.0–0.2)

## 2019-02-08 LAB — C-REACTIVE PROTEIN: CRP: 18.8 mg/dL — ABNORMAL HIGH (ref <1.0)

## 2019-02-08 LAB — GLUCOSE, CAPILLARY
Glucose-Capillary: 160 mg/dL — ABNORMAL HIGH (ref 70–99)
Glucose-Capillary: 155 mg/dL — ABNORMAL HIGH (ref 70–99)
Glucose-Capillary: 167 mg/dL — ABNORMAL HIGH (ref 70–99)
Glucose-Capillary: 189 mg/dL — ABNORMAL HIGH (ref 70–99)
Glucose-Capillary: 167 mg/dL — ABNORMAL HIGH (ref 70–99)

## 2019-02-08 LAB — HEMOGLOBIN AND HEMATOCRIT, BLOOD
HCT: 24.5 % — ABNORMAL LOW (ref 39.0–52.0)
Hemoglobin: 7.9 g/dL — ABNORMAL LOW (ref 13.0–17.0)

## 2019-02-08 LAB — PREPARE RBC (CROSSMATCH)

## 2019-02-08 LAB — SEDIMENTATION RATE: Sed Rate: 111 mm/hr — ABNORMAL HIGH (ref 0–16)

## 2019-02-08 LAB — FERRITIN: Ferritin: 2935 ng/mL — ABNORMAL HIGH (ref 24–336)

## 2019-02-08 LAB — URINE CULTURE: Culture: NO GROWTH

## 2019-02-08 LAB — VITAMIN B12: Vitamin B-12: 1393 pg/mL — ABNORMAL HIGH (ref 180–914)

## 2019-02-08 LAB — PREALBUMIN: Prealbumin: 6.2 mg/dL — ABNORMAL LOW (ref 18–38)

## 2019-02-08 MED ORDER — ENSURE ENLIVE PO LIQD: 237.0000 mL | Freq: Two times a day (BID) | ORAL | Status: DC

## 2019-02-08 MED ORDER — BOOST / RESOURCE BREEZE PO LIQD CUSTOM
1.0000 | Freq: Two times a day (BID) | ORAL | Status: DC
  Administered 2019-02-11: 1 via ORAL

## 2019-02-08 MED ORDER — ADULT MULTIVITAMIN W/MINERALS CH
1.0000 | ORAL_TABLET | Freq: Every day | ORAL | Status: DC
  Administered 2019-02-08 – 2019-02-09 (×2): 1 via ORAL
  Filled 2019-02-08 (×3): qty 1

## 2019-02-08 MED ORDER — ALTEPLASE 2 MG IJ SOLR
2.0000 mg | Freq: Once | INTRAMUSCULAR | Status: AC
  Administered 2019-02-08: 2 mg
  Filled 2019-02-08: qty 2

## 2019-02-08 MED ORDER — SODIUM CHLORIDE 0.9% IV SOLUTION
Freq: Once | INTRAVENOUS | Status: AC
  Administered 2019-02-08: 12:00:00 via INTRAVENOUS

## 2019-02-08 MED ORDER — CITALOPRAM HYDROBROMIDE 20 MG PO TABS
10.0000 mg | ORAL_TABLET | Freq: Every day | ORAL | Status: DC
  Administered 2019-02-09 – 2019-02-28 (×18): 10 mg via ORAL
  Filled 2019-02-08 (×19): qty 1

## 2019-02-08 MED ORDER — DEXAMETHASONE 4 MG PO TABS
2.0000 mg | ORAL_TABLET | Freq: Every day | ORAL | Status: DC
  Administered 2019-02-09 – 2019-02-12 (×4): 2 mg via ORAL
  Filled 2019-02-08 (×4): qty 1

## 2019-02-08 MED ORDER — MAGNESIUM SULFATE 2 GM/50ML IV SOLN
2.0000 g | Freq: Once | INTRAVENOUS | Status: AC
  Administered 2019-02-08: 15:00:00 2 g via INTRAVENOUS
  Filled 2019-02-08: qty 50

## 2019-02-08 MED ORDER — POTASSIUM CHLORIDE CRYS ER 20 MEQ PO TBCR
40.0000 meq | EXTENDED_RELEASE_TABLET | Freq: Once | ORAL | Status: AC
  Administered 2019-02-08: 15:00:00 40 meq via ORAL
  Filled 2019-02-08: qty 2

## 2019-02-08 NOTE — Consult Note (Signed)
Consultation Note Date: 02/08/2019   Patient Name: Mark Frey  DOB: 1956-08-23  MRN: [id number]  Age / Sex: 63 y.o., male  PCP: Mark Olp, MD Referring Physician: Georgette Shell, MD  Reason for Consultation: Establishing goals of care and Psychosocial/spiritual support  HPI/Patient Profile: 63 y.o. male  with past medical history of Burkitt's lymphoma s/p chemotherapy and radiation, s/p ileostomy, type 2 DM, PE/DVT on Eliquis who was admitted on 02/07/2019 with 3 weeks of progressive weakness and increased ostomy output. He was recently admitted from 01/01/19-01/17/19 with severe radiation pancolitis.   Clinical Assessment and Goals of Care:  We have reviewed medical records including EPIC notes, labs and imaging, assessed the patient and then met at the bedside to discuss diagnosis prognosis, GOC, EOL wishes, disposition and options.  Mark Frey seemed somewhat tired and was thus fairly brief in his responses. He tells [place] he is in remission from his lymphoma. His main complaint lately has been weakness, and he endorses continued high output from his ostomy. He is doing everything he can to take in nutrition and stay hydrated.   He currently has home health services that have been assisting him with his care, along with his wife.   He looks forward to receiving blood today to hopefully give him more energy.   We explained that our team is here to serve as an extra layer of support and offer any additional symptom management that he may need.   Questions and concerns were addressed.   Call placed and discussed with wife, after discussing with TRH MD. I introduced myself and palliative care as follows: Palliative medicine is specialized medical care for people living with serious illness. It focuses on providing relief from the symptoms and stress of a serious illness. The goal is to improve  quality of life for both the patient and the family.  Reviewed goals of care as follows: Goals of care: Broad aims of medical therapy in relation to the patient's values and preferences. Our aim is to provide medical care aimed at enabling patients to achieve the goals that matter most to them, given the circumstances of their particular medical situation and their constraints.   Patient's wife recalls that he has had ongoing weakness, poor PO intake since his radiation colitis. They had started having discussions about whether to have hospice services initiated. He became weak but declined to go to the hospital when she called EMS. Later on, the patient agreed to come to the hospital.   Offered active listening and supportive care.  Discussed about appropriate disposition options.    Primary Decision Maker:  PATIENT    SUMMARY OF RECOMMENDATIONS   - patient is DNR - continue current symptom management with IVF, dexamethasone and nutritional support - should hopefully regain strength and functional status once he hopefully recovers from radiation colitis  - recommend outpatient palliative care services at discharge. Ongoing discussions with patient and his wife about appropriate level of supportive services at home. PT has recommended SNF. Wife  also asking about hospice at home.   Code Status/Advance Care Planning:  DNR  Symptom Management:   As above   Additional Recommendations (Limitations, Scope, Preferences):  Full Scope Treatment  Palliative Prophylaxis:   Frequent Pain Assessment  Psycho-social/Spiritual:   Desire for further Chaplaincy support:  Prognosis:   Unable to be determined   Discharge Planning: Home with Palliative Services      Primary Diagnoses: Present on Admission: . Dehydration . Burkitt lymphoma, lymph nodes of multiple sites ([place]) . Hyponatremia . Malnutrition of moderate degree   I have reviewed the medical record, interviewed the  patient and family, and examined the patient. The following aspects are pertinent.  Past Medical History:  Diagnosis Date  . ALLERGIC RHINITIS   . Cancer ([place])    Lymphoma   . Diabetes mellitus   . Hyperlipidemia    Social History   Socioeconomic History  . Marital status: Married    Spouse name: Mark Frey  . Number of children: 0  . Years of education: Not on file  . Highest education level: Not on file  Occupational History  . Not on file  Social Needs  . Financial resource strain: Not hard at all  . Food insecurity:    Worry: Never true    Inability: Never true  . Transportation needs:    Medical: No    Non-medical: No  Tobacco Use  . Smoking status: Never Smoker  . Smokeless tobacco: Never Used  Substance and Sexual Activity  . Alcohol use: Yes    Comment: occasional  . Drug use: No  . Sexual activity: Yes  Lifestyle  . Physical activity:    Days per week: 0 days    Minutes per session: 0 min  . Stress: Not at all  Relationships  . Social connections:    Talks on phone: More than three times a week    Gets together: More than three times a week    Attends religious service: 1 to 4 times per year    Active member of club or organization: No    Attends meetings of clubs or organizations: Never    Relationship status: Married  Other Topics Concern  . Not on file  Social History Narrative   Married 1985. No kids. 4 small dogs.       Works in Financial trader, residential      Hobbies: work on cars, Haematologist, exercise as able   Family History  Problem Relation Age of Onset  . Lung cancer Mother        smoker  . Brain cancer Mother        metastasis  . AAA (abdominal aortic aneurysm) Father        smoker   Scheduled Meds: . sodium chloride   Intravenous Once  . apixaban  2.5 mg Oral BID  . dexamethasone  4 mg Oral Q breakfast  . feeding supplement (ENSURE ENLIVE)  237 mL Oral BID BM  . insulin aspart  0-9 Units Subcutaneous TID WC   Continuous  Infusions: . sodium chloride     Followed by  . sodium chloride 100 mL/hr at 02/08/19 0027   PRN Meds:.acetaminophen **OR** acetaminophen, HYDROcodone-acetaminophen, ondansetron **OR** ondansetron (ZOFRAN) IV, sodium chloride flush Allergies  Allergen Reactions  . Ciprofloxacin Other (See Comments)    Leg tingling   Review of Systems Positive for weakness, decreased appetite, increased ostomy output. Negative for nausea or vomiting.  Physical Exam   General: awake, alert, chronically ill  appearing gentleman, NAD Abd: non-tender, ostomy site c/d/i has ongoing output No edema Has muscle wasting Regular resp effort Psych: flat affect  Non focal  Vital Signs: BP 95/62   Pulse 91   Temp 98.3 F (36.8 C) (Oral)   Resp 16   SpO2 95%  Pain Scale: 0-10   Pain Score: 0-No pain   SpO2: SpO2: 95 % O2 Device:SpO2: 95 % O2 Flow Rate: .   IO: Intake/output summary:   Intake/Output Summary (Last 24 hours) at 02/08/2019 1304 Last data filed at 02/08/2019 1000 Gross per 24 hour  Intake 3154.41 ml  Output 925 ml  Net 2229.41 ml    LBM: Last BM Date: 02/08/19 Baseline Weight:   Most recent weight:       Palliative Assessment/Data: 60%   Flowsheet Rows     Most Recent Value  Intake Tab  Referral Department  Hospitalist  Unit at Time of Referral  ER  Palliative Care Primary Diagnosis  Cancer  Date Notified  02/07/19  Palliative Care Type  New Palliative care  Reason for referral  Clarify Goals of Care  Date of Admission  02/07/19  # of days IP prior to Palliative referral  0  Clinical Assessment  Psychosocial & Spiritual Assessment  Palliative Care Outcomes      Time In: 10 Time Out: 11:10 Time Total: 70 min Visit consisted of counseling and education dealing with the complex and emotionally intense issues surrounding the need for palliative care and symptom management in the setting of serious and potentially life-threatening illness. Greater than 50%  of this time  was spent counseling and coordinating care related to the above assessment and plan.  Patient seen and examined alongside Delice Bison, DO PGY-1   Signed by: Loistine Chance MD [phone]  Please contact Palliative Medicine Team phone at [phone] for questions and concerns.  For individual provider: See Shea Evans

## 2019-02-08 NOTE — TOC Transition Note (Signed)
Transition of Care [facility]) - CM/SW Discharge Note   Patient Details  Name: Mark Frey MRN: [id number] Date of Birth: 1956/02/25  Transition of Care [facility]) CM/SW Contact:  Dessa Phi, RN Phone Number: 02/08/2019, 12:44 PM   Clinical Narrative:Already active w/[facility]-rep Santiago Glad following. Patient has own transp home by spouse. No further CM needs.       Final next level of care: [place] Barriers to Discharge: No Barriers Identified   Patient Goals and CMS Choice Patient states their goals for this hospitalization and ongoing recovery are:: go home CMS Medicare.gov Compare Post Acute Care list provided to:: Patient Choice offered to / list presented to : Patient  Discharge Placement                       Discharge Plan and Services                          HH Arranged: RN, PT [facility] Agency: [place] ([place]) Date [place]: 02/08/19 Time [place]: 1244 Representative spoke with at [place]: [place] ([place]) Interventions     Readmission Risk Interventions Readmission Risk Prevention Plan 02/08/2019  Transportation Screening Complete  Medication Review Press photographer) Complete  PCP or Specialist appointment within 3-5 days of discharge Complete  HRI or [place] Complete  SW Recovery Care/Counseling Consult Complete  [place] Not Applicable  Some recent data might be hidden

## 2019-02-08 NOTE — Progress Notes (Addendum)
Initial Nutrition Assessment  RD working remotely.   DOCUMENTATION CODES:   Non-severe (moderate) malnutrition in context of chronic illness  INTERVENTION:  - continue Ensure Enlive BID, each supplement provides 350 kcal and 20 grams of protein. - will order Boost Breeze BID, each supplement provides 250 kcal and 9 grams of protein. - will order daily multivitamin with minerals. - continue to encourage PO intakes.  - weigh patient today.    NUTRITION DIAGNOSIS:   Increased nutrient needs related to chronic illness, cancer and cancer related treatments as evidenced by estimated needs.  GOAL:   Patient will meet greater than or equal to 90% of their needs  MONITOR:   PO intake, Supplement acceptance, Labs, Weight trends  REASON FOR ASSESSMENT:   Malnutrition Screening Tool, Consult Assessment of nutrition requirement/status  ASSESSMENT:   63 y.o. male with medical history significant of Burkitt's lymphoma s/p radiation and chemotherapy, s/p ileostomy, Type 2 DM, radiation colitis, and PE/DVT on Eliquis. He presented to the ED with 3 week history of worsening weakness. At baseline, he is able to ambulate independently with a walker but more recently was unable to stand up on his own. The patient and his wife went to his PCP on 5/26 to discuss decline and to assess capacity. Patient, at that time was considering hospice referral, but has now opted for acute medical treatment.   Patient known to this RD from hospitalization last month. Patient was able to eat a little bit of breakfast and then about 75% of lunch (375 kcal, 17 grams protein). Patient has had a decreased appetite which began even before admission 1 month ago. Some days are slightly better but most days he is only able to get in 1 good meal.   Last recorded weight was from 5/20 when he weighed 175 lb and on 4/21 he weighed 190 lb. This indicates 15 lb weight loss (8% body weight) in 1 month; significant for time frame.    Ensure Enlive was ordered BID per ONS protocol at the time of admission. Patient has accepted 1 of 3 bottles of this supplements.   Medications reviewed; sliding scale novolog, 2 g IV Mg sulfate x1 run 5/28, 40 mEq K-Dur x1 dose 5/28. Labs reviewed; Na: 127 mmol/l, Cl: 97 mmol/l, Ca: 8.3 mg/dl.      NUTRITION - FOCUSED PHYSICAL EXAM:    Most Recent Value  Orbital Region  Mild depletion  Upper Arm Region  Moderate depletion  Thoracic and Lumbar Region  Unable to assess  Buccal Region  Mild depletion  Temple Region  Mild depletion  Clavicle Bone Region  Mild depletion  Clavicle and Acromion Bone Region  Moderate depletion  Scapular Bone Region  Unable to assess  Dorsal Hand  No depletion  Patellar Region  Mild depletion  Anterior Thigh Region  Unable to assess  Posterior Calf Region  Mild depletion  Edema (RD Assessment)  None  Hair  Reviewed  Eyes  Reviewed  Mouth  Reviewed  Skin  Reviewed  Nails  Reviewed       Diet Order:   Diet Order            Diet regular Room service appropriate? Yes; Fluid consistency: Thin  Diet effective now              EDUCATION NEEDS:   No education needs have been identified at this time  Skin:  Skin Assessment: Reviewed RN Assessment  Last BM:  5/28  Height:   Ht  Readings from Last 1 Encounters:  01/31/19 5' 9"  (1.753 m)    Weight:   Wt Readings from Last 1 Encounters:  01/31/19 79.8 kg    Ideal Body Weight:  72.7 kg  BMI:  There is no height or weight on file to calculate BMI.  Estimated Nutritional Needs:   Kcal:  2300-2500 kcal  Protein:  115-125 grams  Fluid:  >/= 2.2 L/day     Jarome Matin, MS, RD, LDN, [facility] Inpatient Clinical Dietitian Pager # [phone] After hours/weekend pager # [phone]

## 2019-02-08 NOTE — Progress Notes (Signed)
PROGRESS NOTE    Mark Frey  PYP:[id number] DOB: [birth year] DOA: 02/07/2019 PCP: [name], MD  Brief Narrative: 63 y.o. 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 History provided mostly by chart review and wife discussion as patient was not too interested in providing history. Patient presents with three weeks of worsening weakness. At baseline, he is able to ambulate independently with a walker but more recently was unable to stand up on his own. The patient and his wife went to his PCP on 5/26 to discuss decline and to assess capacity. Patient, at that time was considering hospice referral, but has now opted for acute medical treatment.   ED Course: Vitals: Temperature of 99 F, pulse of 110, respirations 18, blood pressure 104/74, SPO2 of 100% on room air. Labs: Sodium of 125, potassium of 3, CO2 of 19, glucose of 329, anion gap of 16, alkaline phosphatase of 141, lactic acid of 3.6, hemoglobin of 8.5, platelets of 128 Imaging: None Medications/Course: IV fluids, morphine  Assessment & Plan:   Active Problems:   Diabetes mellitus (HCC)   Burkitt lymphoma, lymph nodes of multiple sites (HCC)   Hyponatremia   Malnutrition of moderate degree   Dehydration  Dehydration secondary to poor poor p.o. intake as well as high ostomy output.  His blood pressure is still labile to soft continue IV fluids at normal saline 100 cc an hour.  He was seen by physical therapy and they recommend SNF placement.  Patient has very poor oral intake with diminished appetite.  Patient also with moderate malnutrition dietary consult pending.   Multiple electrolyte abnormalities secondary to dehydration decreased p.o. intake and increased ostomy output including hyponatremia hypokalemia and hypomagnesemia which is being replaced.  Type 2 diabetes continue sliding  scale insulin and monitor blood sugar Janumet was DC'd recently.   History of DVT and PE continue Eliquis  History of Burkitt's lymphoma status post radiation and chemo [name] to see the patient today.  Palliative care consulted.  Patient requires continues hospital stay to treat dehydration and minimal po intake.  Patient with ongoing increased output from the colostomy and very minimal p.o. intake need to figure out alternate route of nutrition oncologist thinking about TPN await his recommendations.  History of colitis- continue ivf   Anemia of chronic disease -received one unit of prbc follow up labs  DVT prophylaxis: Eliquis Code Status: DNR Family Communication: Wife on telephone, 24 minutes discussing history and plan of care. Disposition Plan: Admit to medical floor Consults called: Palliative care medicine   Pressure Injury 01/04/19 Stage II -  Partial thickness loss of dermis presenting as a shallow open ulcer with a red, pink wound bed without slough. redness around wound bed (Active)  01/04/19 0930  Location:   Location Orientation: Left;Right;Medial  Staging: Stage II -  Partial thickness loss of dermis presenting as a shallow open ulcer with a red, pink wound bed without slough.  Wound Description (Comments): redness around wound bed  Present on Admission: Yes      Estimated body mass index is 25.99 kg/m as calculated from the following:   Height as of 01/31/19: 5' 9"  (1.753 m).   Weight as of 01/31/19: 79.8 kg.  Subjective: When I walked into the patient's room he asked me if I am a floor doctor and when I said yes he told me to get out just get  out he did not want me to talk to him examine him or touch him.  Objective: Vitals:   02/07/19 2019 02/08/19 0412 02/08/19 1200 02/08/19 1230  BP: 110/68 103/64 102/60 95/62  Pulse: (!) 108 (!) 106 99 91  Resp: 18 18 15 16   Temp:  98.4 F (36.9 C) 98.3 F (36.8 C) 98.3 F (36.8 C)  TempSrc: Oral Oral Oral Oral   SpO2: 98% 99% 99% 95%    Intake/Output Summary (Last 24 hours) at 02/08/2019 1401 Last data filed at 02/08/2019 1300 Gross per 24 hour  Intake 3394.41 ml  Output 925 ml  Net 2469.41 ml   There were no vitals filed for this visit.  Examination:  Not done as patient refused    Data Reviewed: I have personally reviewed following labs and imaging studies  CBC: Recent Labs  Lab 02/07/19 1014 02/08/19 0553  WBC 6.3 5.6  NEUTROABS 5.4  --   HGB 8.5* 7.0*  HCT 25.9* 22.4*  MCV 94.9 97.4  PLT 128* 694*   Basic Metabolic Panel: Recent Labs  Lab 02/07/19 1014 02/07/19 1817 02/07/19 2323 02/08/19 0553 02/08/19 1140  NA 125* 127* 128* 127* 127*  K 3.0* 3.4* 3.3* 3.2* 3.5  CL 90* 95* 98 98 97*  CO2 19* 23 22 21* 19*  GLUCOSE 329* 185* 154* 174* 183*  BUN 19 16 15 11 13   CREATININE 0.67 0.63 0.55* 0.51* 0.71  CALCIUM 8.7* 8.4* 8.5* 8.2* 8.3*  MG 1.9  --   --   --   --    GFR: Estimated Creatinine Clearance: 95.7 mL/min (by C-G formula based on SCr of 0.71 mg/dL). Liver Function Tests: Recent Labs  Lab 02/07/19 1014  AST 15  ALT 30  ALKPHOS 141*  BILITOT 0.8  PROT 5.9*  ALBUMIN 2.5*   No results for input(s): LIPASE, AMYLASE in the last 168 hours. No results for input(s): AMMONIA in the last 168 hours. Coagulation Profile: No results for input(s): INR, PROTIME in the last 168 hours. Cardiac Enzymes: No results for input(s): CKTOTAL, CKMB, CKMBINDEX, TROPONINI in the last 168 hours. BNP (last 3 results) No results for input(s): PROBNP in the last 8760 hours. HbA1C: No results for input(s): HGBA1C in the last 72 hours. CBG: Recent Labs  Lab 02/07/19 0940 02/07/19 1708 02/08/19 0732 02/08/19 1151  GLUCAP 349* 150* 160* 155*   Lipid Profile: No results for input(s): CHOL, HDL, LDLCALC, TRIG, CHOLHDL, LDLDIRECT in the last 72 hours. Thyroid Function Tests: No results for input(s): TSH, T4TOTAL, FREET4, T3FREE, THYROIDAB in the last 72 hours. Anemia  Panel: No results for input(s): VITAMINB12, FOLATE, FERRITIN, TIBC, IRON, RETICCTPCT in the last 72 hours. Sepsis Labs: Recent Labs  Lab 02/07/19 1014 02/07/19 1303  LATICACIDVEN 3.6* 2.4*    Recent Results (from the past 240 hour(s))  SARS Coronavirus 2 (CEPHEID - Performed in [place] hospital lab), Hosp Order     Status: None   Collection Time: 02/07/19 10:14 AM  Result Value Ref Range Status   SARS Coronavirus 2 NEGATIVE NEGATIVE Final    Comment: (NOTE) If result is NEGATIVE SARS-CoV-2 target nucleic acids are NOT DETECTED. The SARS-CoV-2 RNA is generally detectable in upper and lower  respiratory specimens during the acute phase of infection. The lowest  concentration of SARS-CoV-2 viral copies this assay can detect is 250  copies / mL. A negative result does not preclude SARS-CoV-2 infection  and should not be used as the sole basis for treatment or other  patient management decisions.  A negative result may occur with  improper specimen collection / handling, submission of specimen other  than nasopharyngeal swab, presence of viral mutation(s) within the  areas targeted by this assay, and inadequate number of viral copies  (<250 copies / mL). A negative result must be combined with clinical  observations, patient history, and epidemiological information. If result is POSITIVE SARS-CoV-2 target nucleic acids are DETECTED. The SARS-CoV-2 RNA is generally detectable in upper and lower  respiratory specimens dur ing the acute phase of infection.  Positive  results are indicative of active infection with SARS-CoV-2.  Clinical  correlation with patient history and other diagnostic information is  necessary to determine patient infection status.  Positive results do  not rule out bacterial infection or co-infection with other viruses. If result is PRESUMPTIVE POSTIVE SARS-CoV-2 nucleic acids MAY BE PRESENT.   A presumptive positive result was obtained on the submitted  specimen  and confirmed on repeat testing.  While 2019 novel coronavirus  (SARS-CoV-2) nucleic acids may be present in the submitted sample  additional confirmatory testing may be necessary for epidemiological  and / or clinical management purposes  to differentiate between  SARS-CoV-2 and other Sarbecovirus currently known to infect humans.  If clinically indicated additional testing with an alternate test  methodology [id number]) is advised. The SARS-CoV-2 RNA is generally  detectable in upper and lower respiratory sp ecimens during the acute  phase of infection. The expected result is Negative. Fact Sheet for Patients:  StrictlyIdeas.no Fact Sheet for Healthcare Providers: BankingDealers.co.za This test is not yet approved or cleared by the [place] FDA and has been authorized for detection and/or diagnosis of SARS-CoV-2 by FDA under an Emergency Use Authorization (EUA).  This EUA will remain in effect (meaning this test can be used) for the duration of the COVID-19 declaration under Section 564(b)(1) of the Act, 21 U.S.C. section 360bbb-3(b)(1), unless the authorization is terminated or revoked sooner. Performed at [facility], [place] [address], [place], [place] [address]          Radiology Studies: No results found.      Scheduled Meds: . apixaban  2.5 mg Oral BID  . dexamethasone  4 mg Oral Q breakfast  . feeding supplement (ENSURE ENLIVE)  237 mL Oral BID BM  . insulin aspart  0-9 Units Subcutaneous TID WC   Continuous Infusions: . sodium chloride     Followed by  . sodium chloride 100 mL/hr at 02/08/19 0027     LOS: 0 days     Georgette Shell, MD [facility]   If 7PM-7AM, please contact night-coverage [email or web address] Password [email or web address] 02/08/2019, 2:01 PM

## 2019-02-08 NOTE — Evaluation (Signed)
Physical Therapy Evaluation Patient Details Name: Mark Frey MRN: [id number] DOB: [birth year] Today's Date: 02/08/2019   History of Present Illness  63 y.o. 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, s/p ileostomy, diabetes mellitus, type 2, radiation colitis, PE/DVT on Eliquis and admitted for dehydration and generalized weakness  Clinical Impression  Pt admitted with above diagnosis. Pt currently with functional limitations due to the deficits listed below (see PT Problem List).  Pt will benefit from skilled PT to increase their independence and safety with mobility to allow discharge to the venue listed below.  Pt attempted to stand however felt unable (questionable pt effort vs weakness).  Pt not agreeable to try stedy to better assist and requested back to bed.  From chart review, pt at home with spouse and mobility progressively declining.  Recommend SNF as spouse is likely not able to provide physical assist.     Follow Up Recommendations SNF;Supervision/Assistance - 24 hour    Equipment Recommendations  Wheelchair cushion (measurements PT);Wheelchair (measurements PT)        Recommendations for Other Services       Precautions / Restrictions Precautions Precautions: Fall Precaution Comments: ileostomy Restrictions Weight Bearing Restrictions: No      Mobility  Bed Mobility Overal bed mobility: Needs Assistance Bed Mobility: Supine to Sit;Sit to Supine     Supine to sit: Supervision Sit to supine: Supervision      Transfers Overall transfer level: Needs assistance Equipment used: Rolling walker (2 wheeled) Transfers: Sit to/from Stand Sit to Stand: Total assist         General transfer comment: pt attempted and assist provided however pt unable to raise buttocks from bed however pt was able to shift weight to scoot bottom back in bed to return to supine  Ambulation/Gait                 Stairs            Wheelchair Mobility    Modified Rankin (Stroke Patients Only)       Balance Overall balance assessment: Needs assistance Sitting-balance support: No upper extremity supported;Feet supported Sitting balance-Leahy Scale: Fair         Standing balance comment: unable to stand today                             Pertinent Vitals/Pain Pain Assessment: No/denies pain    Home Living Family/patient expects to be discharged to:: Private residence Living Arrangements: Spouse/significant other Available Help at Discharge: Family;Available 24 hours/day Type of Home: House Home Access: Stairs to enter Entrance Stairs-Rails: None Entrance Stairs-Number of Steps: 2 Home Layout: One level Home Equipment: Cane - single point;Walker - 4 wheels Additional Comments: from previous admission    Prior Function Level of Independence: Independent with assistive device(s)         Comments: walks with rollator; independent bathing/dressing     Hand Dominance        Extremity/Trunk Assessment        Lower Extremity Assessment Lower Extremity Assessment: Generalized weakness       Communication   Communication: No difficulties  Cognition Arousal/Alertness: Awake/alert Behavior During Therapy: Flat affect Overall Cognitive Status: Within Functional Limits for tasks assessed  General Comments      Exercises     Assessment/Plan    PT Assessment Patient needs continued PT services  PT Problem List Decreased strength;Decreased mobility;Decreased activity tolerance;Decreased knowledge of use of DME;Decreased balance       PT Treatment Interventions DME instruction;Therapeutic activities;Stair training;Gait training;Therapeutic exercise;Functional mobility training;Patient/family education;Wheelchair mobility training    PT Goals (Current goals can be found in the Care Plan section)   Acute Rehab PT Goals PT Goal Formulation: With patient Time For Goal Achievement: 02/15/19 Potential to Achieve Goals: Good    Frequency Min 2X/week   Barriers to discharge        Co-evaluation               AM-PAC PT "6 Clicks" Mobility  Outcome Measure Help needed turning from your back to your side while in a flat bed without using bedrails?: A Little Help needed moving from lying on your back to sitting on the side of a flat bed without using bedrails?: A Little Help needed moving to and from a bed to a chair (including a wheelchair)?: Total Help needed standing up from a chair using your arms (e.g., wheelchair or bedside chair)?: Total Help needed to walk in hospital room?: Total Help needed climbing 3-5 steps with a railing? : Total 6 Click Score: 10    End of Session Equipment Utilized During Treatment: Gait belt Activity Tolerance: Patient tolerated treatment well;Patient limited by fatigue Patient left: in bed;with call bell/phone within reach;with bed alarm set Nurse Communication: Mobility status PT Visit Diagnosis: Muscle weakness (generalized) (M62.81)    Time: 0354-6568 PT Time Calculation (min) (ACUTE ONLY): 15 min   Charges:   PT Evaluation $PT Eval Low Complexity: [place], PT, DPT Acute Rehabilitation Services Office: [phone] Pager: [phone]  Trena Platt 02/08/2019, 12:56 PM

## 2019-02-08 NOTE — Progress Notes (Signed)
[name]   HEMATOLOGY/ONCOLOGY INPATIENT PROGRESS NOTE  Date of Service: 02/08/2019  Inpatient Attending: .Georgette Shell, MD   SUBJECTIVE  Patient seen this . Notes that he is feeling a little brighter with the fluids and 1 unit of PRBC but still very fatigued. He was initially hesitant to talk but then opened by and did discuss the possibility that he is possibly feeling quite depressed. He is willing to do all it takes to give it a good shot at recovery. We discussed TPN, need to address his depression and need for therapies and continuing to optimize po intake. Still with mucoid rectal discharge and significant ileostomy output.  OBJECTIVE:  Appears fatigued and with temporal wasting.  PHYSICAL EXAMINATION  .BP 103/68 (BP Location: Right Arm)   Pulse 73   Temp 97.9 F (36.6 C) (Oral)   Resp 16   Ht 5' 9"  (1.753 m)   Wt 152 lb 1.9 oz (69 kg)   SpO2 97%   BMI 22.46 kg/m  . GENERAL:alert, in no acute distress, fatigued appearing. SKIN: no acute rashes, no significant lesions EYES: conjunctiva are pink and non-injected, sclera anicteric OROPHARYNX: MMM NECK: supple, no JVD LYMPH:  no palpable lymphadenopathy in the cervical, axillary or inguinal regions LUNGS: clear to auscultation b/l with normal respiratory effort HEART: regular rate & rhythm ABDOMEN:  Ileostomy in situ, no tenderness to palpation. Extremity: no pedal edema PSYCH: alert & oriented x 3 with fluent speech NEURO: no focal motor/sensory deficits      MEDICAL HISTORY:  Past Medical History:  Diagnosis Date  . ALLERGIC RHINITIS   . Cancer ([place])    Lymphoma   . Diabetes mellitus   . Hyperlipidemia     SURGICAL HISTORY: Past Surgical History:  Procedure Laterality Date  . BIOPSY  03/15/2018   Procedure: BIOPSY;  Surgeon: Milus Banister, MD;  Location[name];  Service: Endoscopy;;  . BOWEL RESECTION N/A 08/25/2018   Procedure: LAPROSCOPIC LOOP ILEOSTOMY;  Surgeon: Greer Pickerel, MD;   Location: [facility];  Service: General;  Laterality: N/A;  . CLEFT PALATE REPAIR    . COLONOSCOPY N/A 03/15/2018   Procedure: COLONOSCOPY;  Surgeon: Milus Banister, MD;  Location: [facility];  Service: Endoscopy;  Laterality: N/A;  . COLONOSCOPY N/A 08/20/2018   Procedure: COLONOSCOPY;  Surgeon: Irene Shipper, MD;  Location: [facility];  Service: Endoscopy;  Laterality: N/A;  . deviated septum repair     slight improvement  . ESOPHAGOGASTRODUODENOSCOPY N/A 03/15/2018   Procedure: ESOPHAGOGASTRODUODENOSCOPY (EGD);  Surgeon: Milus Banister, MD;  Location: [name];  Service: Endoscopy;  Laterality: N/A;  . IR IMAGING GUIDED PORT INSERTION  03/17/2018  . LAPAROSCOPY N/A 08/25/2018   Procedure: LAPAROSCOPY DIAGNOSTIC;  Surgeon: Greer Pickerel, MD;  Location: [facility];  Service: General;  Laterality: N/A;  . TONSILLECTOMY      SOCIAL HISTORY: Social History   Socioeconomic History  . Marital status: Married    Spouse name: lisa  . Number of children: 0  . Years of education: Not on file  . Highest education level: Not on file  Occupational History  . Not on file  Social Needs  . Financial resource strain: Not hard at all  . Food insecurity:    Worry: Never true    Inability: Never true  . Transportation needs:    Medical: No    Non-medical: No  Tobacco Use  . Smoking status: Never Smoker  . Smokeless tobacco: Never Used  Substance and Sexual  Activity  . Alcohol use: Yes    Comment: occasional  . Drug use: No  . Sexual activity: Yes  Lifestyle  . Physical activity:    Days per week: 0 days    Minutes per session: 0 min  . Stress: Not at all  Relationships  . Social connections:    Talks on phone: More than three times a week    Gets together: More than three times a week    Attends religious service: 1 to 4 times per year    Active member of club or organization: No    Attends meetings of clubs or organizations: Never    Relationship status: Married  . Intimate partner  violence:    Fear of current or ex partner: No    Emotionally abused: No    Physically abused: No    Forced sexual activity: No  Other Topics Concern  . Not on file  Social History Narrative   Married 1985. No kids. 4 small dogs.       Works in Financial trader, residential      Hobbies: work on cars, Haematologist, exercise as able    FAMILY HISTORY: Family History  Problem Relation Age of Onset  . Lung cancer Mother        smoker  . Brain cancer Mother        metastasis  . AAA (abdominal aortic aneurysm) Father        smoker    ALLERGIES:  is allergic to ciprofloxacin.  MEDICATIONS:  Scheduled Meds: . apixaban  2.5 mg Oral BID  . dexamethasone  4 mg Oral Q breakfast  . feeding supplement (ENSURE ENLIVE)  237 mL Oral BID BM  . insulin aspart  0-9 Units Subcutaneous TID WC   Continuous Infusions: . sodium chloride     Followed by  . sodium chloride 100 mL/hr at 02/08/19 0027   PRN Meds:.acetaminophen **OR** acetaminophen, HYDROcodone-acetaminophen, ondansetron **OR** ondansetron (ZOFRAN) IV, sodium chloride flush  REVIEW OF SYSTEMS:    10 Point review of Systems was done is negative except as noted above.   LABORATORY DATA:  I have reviewed the data as listed  . CBC Latest Ref Rng & Units 02/08/2019 02/08/2019 02/07/2019  WBC 4.0 - 10.5 K/uL - 5.6 6.3  Hemoglobin 13.0 - 17.0 g/dL 7.9(L) 7.0(L) 8.5(L)  Hematocrit 39.0 - 52.0 % 24.5(L) 22.4(L) 25.9(L)  Platelets 150 - 400 K/uL - 131(L) 128(L)   . CMP Latest Ref Rng & Units 02/08/2019 02/08/2019 02/08/2019  Glucose 70 - 99 mg/dL 191(H) 183(H) 174(H)  BUN 8 - 23 mg/dL 11 13 11   Creatinine 0.61 - 1.24 mg/dL 0.53(L) 0.71 0.51(L)  Sodium 135 - 145 mmol/L 125(L) 127(L) 127(L)  Potassium 3.5 - 5.1 mmol/L 3.7 3.5 3.2(L)  Chloride 98 - 111 mmol/L 95(L) 97(L) 98  CO2 22 - 32 mmol/L 22 19(L) 21(L)  Calcium 8.9 - 10.3 mg/dL 8.1(L) 8.3(L) 8.2(L)  Total Protein 6.5 - 8.1 g/dL - - -  Total Bilirubin 0.3 - 1.2 mg/dL - - -   Alkaline Phos 38 - 126 U/L - - -  AST 15 - 41 U/L - - -  ALT 0 - 44 U/L - - -     RADIOGRAPHIC STUDIES: I have personally reviewed the radiological images as listed and agreed with the findings in the report. No results found.  ASSESSMENT & PLAN:   1. 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  3. H/o Small bowel obstruction/ileus with sigmoid thickening/stenosis . 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 EGreer Pickerel 4. 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 GI panel and C. difficile negative.  CMV IgM negative, EBV IgM negative. 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 Sed rate and CRP still elevated  5.Hypovolemic hyponatremia. Other electrolyte issues including hypokalemia and hypomagnesemia  6. Pancytopenia -  Anemia likely multifactorial but could be from GI bleeding plus significant inflammation from severe radiation colitis. Leukopenia/lymphopenia likely from radiation toxicity.-resolved Thrombocytopenia from consumption related to GI bleeding and from radiation toxicity.- nearly normalized. B12 within normal limits Copper levels within normal limits at 132 Zinc deficiency noted- 44 Viral work-up not revealing Low likelihood of Burkitt's lymphoma progression at this time.  7. Admitted with failure to thrive, dehydration electrolyte abnormalities and hyperglycemia with poor p.o. intake. 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  8. Depression - discussed with patient. Will start Celexa in AM  9. Symptomatic anemia -transfuse prn to maintain hgb>8 given symptoms and some GI losses. Plan -Appreciate excellent hospitalist cares. -IVF, potassium and magnesemia replacement -continue Zinc replacement -will need to  start TPN given limited po intake and expected bowel dysfunction for the next 4-8 weeks -TPN order set placed  -more talkative with me today for goals of care and finally said " let do it the doctors way". -optimize DM2 management -will consider use of Anabolic steroids - Oxandrolone to facilitate rehab once he starts to ambulate to reduce risk of VTE. -may need to consider ESA for anemia or chronic disease -transfuse tpo hgb of 8 given symptoms. -Continue blood thinners Eliquis 2.5 mg p.o. twice daily for his history of PE -Will likely need SNF on discharge though patient might be resistant to this complicating the discharge plan.  I spent 45 minutes counseling the patient face to face. The total time spent in the appointment was 60 minutes and more than 50% was on counseling and direct patient cares.    GSullivan LoneMD MPoteetAAHIVMS SMem[facility] Of [facility] Gertrude Jones HospitalCLafayette Behavioral Health UnitHematology/Oncology Physician CShriners [facility] - [facility] (Office):       [phone](Work cell):  [phone](Fax):           [phone]

## 2019-02-08 NOTE — Progress Notes (Signed)
[name]   HEMATOLOGY/ONCOLOGY INPATIENT PROGRESS NOTE  Date of Service: 02/08/2019  Inpatient Attending: .Georgette Shell, MD   SUBJECTIVE  [name] is very well-known to the medical oncology service.  He has a history of Burkitt's lymphoma that is currently in remission after 6 cycles of R-EPOCH chemotherapy followed by consolidative radiation. 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  He was neutropenic and the neutropenia resolved with G-CSF.  He was also showing anemia of chronic disease related to severe inflammation from his acute radiation colitis.  He initially required RBC transfusions but was transfusion independent on discharge.  His thrombocytopenia was also resolving. He had a dietary consultation for assessment of TPN need but has started eating better and was deemed not to need TPN. He was set up for home care services to get physical therapy and schedule IV fluids.  He declined IV fluids since they could not access his port with home care. Over the last 1 to 2 weeks the patient's p.o. intake has declined and he has gotten weaker as per his wife's report.  He had been resistant to going to the emergency room or coming into the cancer clinic for fluids but eventually agreed. His labs show that he is dehydrated with hyponatremia of 127 hypokalemia and elevated lactic acid level. No overt signs of sepsis were noted.  Uncontrolled hyperglycemia with blood sugar of 329 He was admitted for optimizing his dehydration and electrolyte abnormalities and rule out other reasons for his decompensating health and fatigue. He has been somewhat resistant to receiving cares and has been hesitant to consider SNF. No fevers no chills.  No overt blood in the ileostomy bag.   OBJECTIVE:  Appears fatigued and with temporal  wasting.  PHYSICAL EXAMINATION VS reviewed  GENERAL:alert, fatigued appearing SKIN: No acute rashes EYES: Conjunctival pallor noted no icterus OROPHARYNX:no exudate, no erythema and lips, buccal mucosa, and tongue normal  NECK: supple, no JVD, thyroid normal size, non-tender, without nodularity LYMPH:  no palpable lymphadenopathy in the cervical, axillary or inguinal LUNGS: clear to auscultation with normal respiratory effort HEART: regular rate & rhythm,  no murmurs and no lower extremity edema ABDOMEN: abdomen soft, non-tender, normoactive bowel sounds  Musculoskeletal: no cyanosis of digits and no clubbing  PSYCH: alert & oriented x 3 with limited attempt at responding to questions NEURO: no focal motor/sensory deficits  MEDICAL HISTORY:  Past Medical History:  Diagnosis Date   ALLERGIC RHINITIS    Cancer ([place])    Lymphoma    Diabetes mellitus    Hyperlipidemia     SURGICAL HISTORY: Past Surgical History:  Procedure Laterality Date   BIOPSY  03/15/2018   Procedure: BIOPSY;  Surgeon: Milus Banister, MD;  Location: [facility];  Service: Endoscopy;;   BOWEL RESECTION N/A 08/25/2018   Procedure: LAPROSCOPIC LOOP ILEOSTOMY;  Surgeon: Greer Pickerel, MD;  Location: [name];  Service: General;  Laterality: N/A;   CLEFT PALATE REPAIR     COLONOSCOPY N/A 03/15/2018   Procedure: COLONOSCOPY;  Surgeon: Milus Banister, MD;  Location: [name];  Service: Endoscopy;  Laterality: N/A;   COLONOSCOPY N/A 08/20/2018   Procedure: COLONOSCOPY;  Surgeon: Irene Shipper, MD;  Location: [facility];  Service: Endoscopy;  Laterality: N/A;   deviated septum repair     slight improvement   ESOPHAGOGASTRODUODENOSCOPY N/A 03/15/2018   Procedure: ESOPHAGOGASTRODUODENOSCOPY (EGD);  Surgeon: Milus Banister, MD;  Location: [name];  Service: Endoscopy;  Laterality: N/A;   IR IMAGING GUIDED PORT INSERTION  03/17/2018   LAPAROSCOPY N/A 08/25/2018   Procedure: LAPAROSCOPY DIAGNOSTIC;   Surgeon: Greer Pickerel, MD;  Location: [facility];  Service: General;  Laterality: N/A;   TONSILLECTOMY      SOCIAL HISTORY: Social History   Socioeconomic History   Marital status: Married    Spouse name: lisa   Number of children: 0   Years of education: Not on file   Highest education level: Not on file  Occupational History   Not on file  Social Needs   Financial resource strain: Not hard at all   Food insecurity:    Worry: Never true    Inability: Never true   Transportation needs:    Medical: No    Non-medical: No  Tobacco Use   Smoking status: Never Smoker   Smokeless tobacco: Never Used  Substance and Sexual Activity   Alcohol use: Yes    Comment: occasional   Drug use: No   Sexual activity: Yes  Lifestyle   Physical activity:    Days per week: 0 days    Minutes per session: 0 min   Stress: Not at all  Relationships   Social connections:    Talks on phone: More than three times a week    Gets together: More than three times a week    Attends religious service: 1 to 4 times per year    Active member of club or organization: No    Attends meetings of clubs or organizations: Never    Relationship status: Married   Intimate partner violence:    Fear of current or ex partner: No    Emotionally abused: No    Physically abused: No    Forced sexual activity: No  Other Topics Concern   Not on file  Social History Narrative   Married 1985. No kids. 4 small dogs.       Works in Financial trader, residential      Hobbies: work on cars, Haematologist, exercise as able    FAMILY HISTORY: Family History  Problem Relation Age of Onset   Lung cancer Mother        smoker   Brain cancer Mother        metastasis   AAA (abdominal aortic aneurysm) Father        smoker    ALLERGIES:  is allergic to ciprofloxacin.  MEDICATIONS:  Scheduled Meds:  apixaban  2.5 mg Oral BID   dexamethasone  4 mg Oral Q breakfast   feeding supplement  (ENSURE ENLIVE)  237 mL Oral BID BM   insulin aspart  0-9 Units Subcutaneous TID WC   Continuous Infusions:  sodium chloride     Followed by   sodium chloride 100 mL/hr at 02/08/19 9485   magnesium sulfate bolus IVPB 2 g (02/08/19 1527)   PRN Meds:.acetaminophen **OR** acetaminophen, HYDROcodone-acetaminophen, ondansetron **OR** ondansetron (ZOFRAN) IV, sodium chloride flush  REVIEW OF SYSTEMS:    10 Point review of Systems was done is negative except as noted above.   LABORATORY DATA:  I have reviewed the data as listed  . CBC Latest Ref Rng & Units 02/07/2019 01/25/2019  WBC 4.0 - 10.5 K/uL 6.3 6.8  Hemoglobin 13.0 - 17.0 g/dL 8.5(L) 8.7(L)  Hematocrit 39.0 - 52.0 % 25.9(L) 27.1(L)  Platelets 150 - 400 K/uL 128(L) 139(L)    . CMP Latest Ref Rng &  Units 02/07/2019  Glucose 70 - 99 mg/dL 154(H)  BUN 8 - 23 mg/dL 15  Creatinine 0.61 - 1.24 mg/dL 0.55(L)  Sodium 135 - 145 mmol/L 128(L)  Potassium 3.5 - 5.1 mmol/L 3.3(L)  Chloride 98 - 111 mmol/L 98  CO2 22 - 32 mmol/L 22  Calcium 8.9 - 10.3 mg/dL 8.5(L)  Total Protein 6.5 - 8.1 g/dL -  Total Bilirubin 0.3 - 1.2 mg/dL -  Alkaline Phos 38 - 126 U/L -  AST 15 - 41 U/L -  ALT 0 - 44 U/L -     RADIOGRAPHIC STUDIES: I have personally reviewed the radiological images as listed and agreed with the findings in the report. No results found.  ASSESSMENT & PLAN:   1. 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 No BM or CNS involvement.   04/18/18 PET/CT revealed Continued improvement in right colonic wall thickening and surrounding bulky masses consistent with treated lymphoma. No hypermetabolic activity to suggest residual tumor. There is some hypermetabolic activity associated with the sigmoid colon which demonstrates mild wall thickening, surrounding inflammation and underlying diverticulosis, suggesting mild diverticulitis. Suspected treatment related changes throughout the bone marrow. There is mildly increased activity within the clivus without clear corresponding finding on the CT images. Attention on follow-up recommended. Nonobstructing right renal calculi. 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   06/09/18 PET/CT revealedSoft tissue lesion within the ventral pelvis is again identified demonstrating mild to moderate increased uptake within SUV max of 5.61. Deauville criteria 4. Concerning for residual metabolically active tumor. 2. Similar appearance of diffusely thickened appendix within the right lower quadrant of the abdomen. Findings may reflect treatment related changes. 3. Increased radiotracer uptake throughout the bone marrow which is favored to represent treatment related changes. 4. Lad coronary artery atherosclerotic calcifications.   patient completed planned chemorx on 10/21  10/05/18 CT A/P revealed Persistent wall thickening in the sigmoid colon although a component of this could be due to diverticulosis. There is also a suggestion of wall thickening and potential mucosal enhancement in the terminal ileum, as well as adjacent indistinct stranding in the mesenteric and omental adipose tissues roughly similar to the prior exam. Some of this may be treatment related. Inflammatory process such as Crohn's disease not excluded. 2. No current adenopathy. 3. Other imaging findings of potential clinical significance: Nonobstructive right nephrolithiasis. Aortic Atherosclerosis.  Moderate prostatomegaly. Left foraminal impingement at L4-5.  S/p IMRT with 36 Gy over 20 fractions, between 11/09/18 and 12/06/18 with Dr. Isidore Moos  2.H/oRLE DVT and b/l PEon anticoagulation.   3. Small bowel obstruction/ileus with sigmoid thickening/stenosis . CT scan - sigmoid obstructive lesion ? Scar tissue from resolved lymphoma vs residual lymphoma vs post inflammatory scarring from diverticulitis/limited perforation. Also has SBO from adhesions. Colonoscopy 08/21/2018 - no intramucosal lesions. Extrinsic compression of sigmoid colon. 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 Greer Pickerel  4. 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 GI panel and C. difficile negative.  CMV IgM negative, EBV IgM negative. Significantly elevated sed rate and CRP consistent with  severe inflammation from his radiation colitis.  5.ResolvedDehydration with hypovolemic hyponatremia. Other electrolyte issues including hypokalemia and hypomagnesemia -stabilizing.  6. Pancytopenia Anemia likely multifactorial but could be from GI bleeding plus significant inflammation from severe radiation colitis. Leukopenia/lymphopenia likely from radiation toxicity. Protein-losing enteropathy can also accentuate the lymphopenia and bone marrow suppression due to severe inflammatory state from his radiation colitis. resolved Thrombocytopenia from consumption related to GI bleeding and from radiation toxicity. B12 within normal limits Copper levels within normal limits at 132 Zinc deficiency noted- 44 Viral work-up not revealing Low likelihood of Burkitt's lymphoma progression at this time.  7. Admitted with failure to thrive, dehydration electrolyte abnormalities and hyperglycemia with poor p.o. intake. 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 Plan -Appreciate excellent hospitalist cares. -IV fluids and aggressive electrolyte replacement as per hospitalist team. -We will check  magnesium and phosphorus as well and replace as needed. -We will recheck some nutritional labs including zinc B12 RBC folate -Dietitian consultation to do calorie count and with strong consideration of TPN. -If patient continues to refuse input might need to have palliative care involved for goals of care discussion and potentially need to evaluate the patient for decisional capacity. -Would consult behavioral health for evaluation of depression and consideration of starting SSRI. -Evaluate for other etiologies of failure to thrive including ruling out urinary tract infection and consideration of stool studies if diarrhea present. -Continue blood thinners Eliquis 2.5 mg p.o. twice daily for his history of PE -Will recheck sed rate and CRP as a function of resolving bowel inflammation. -Continue replacing p.o. zinc but anticipate patient might need TPN. -Continued goals of care discussion -Optimize management of diabetes. -Will likely need SNF on discharge though patient might be resistant to this complicating the discharge plan.  I spent 30 minutes counseling the patient face to face. The total time spent in the appointment was 40 minutes and more than 50% was on counseling and direct patient cares.    Sullivan Lone MD [place] AAHIVMS [facility] Hematology/Oncology Physician [facility]  (Office):       [phone] (Work cell):  [phone] (Fax):           [phone]

## 2019-02-09 ENCOUNTER — Inpatient Hospital Stay: Payer: BC Managed Care – PPO

## 2019-02-09 DIAGNOSIS — M6259 Muscle wasting and atrophy, not elsewhere classified, multiple sites: Secondary | ICD-10-CM

## 2019-02-09 DIAGNOSIS — D649 Anemia, unspecified: Secondary | ICD-10-CM

## 2019-02-09 LAB — COMPREHENSIVE METABOLIC PANEL
ALT: 28 U/L (ref 0–44)
AST: 13 U/L — ABNORMAL LOW (ref 15–41)
Albumin: 2.1 g/dL — ABNORMAL LOW (ref 3.5–5.0)
Alkaline Phosphatase: 114 U/L (ref 38–126)
Anion gap: 10 (ref 5–15)
BUN: 12 mg/dL (ref 8–23)
CO2: 20 mmol/L — ABNORMAL LOW (ref 22–32)
Calcium: 8.3 mg/dL — ABNORMAL LOW (ref 8.9–10.3)
Chloride: 98 mmol/L (ref 98–111)
Creatinine, Ser: 0.47 mg/dL — ABNORMAL LOW (ref 0.61–1.24)
GFR calc Af Amer: >60 mL/min (ref >60)
GFR calc non Af Amer: >60 mL/min (ref >60)
Glucose, Bld: 186 mg/dL — ABNORMAL HIGH (ref 70–99)
Potassium: 3.7 mmol/L (ref 3.5–5.1)
Sodium: 128 mmol/L — ABNORMAL LOW (ref 135–145)
Total Bilirubin: 0.8 mg/dL (ref 0.3–1.2)
Total Protein: 5.2 g/dL — ABNORMAL LOW (ref 6.5–8.1)

## 2019-02-09 LAB — DIFFERENTIAL
Abs Immature Granulocytes: 0.17 10*3/uL — ABNORMAL HIGH (ref 0.00–0.07)
Basophils Absolute: 0 10*3/uL (ref 0.0–0.1)
Basophils Relative: 1 %
Eosinophils Absolute: 0 10*3/uL (ref 0.0–0.5)
Eosinophils Relative: 0 %
Immature Granulocytes: 4 %
Lymphocytes Relative: 2 %
Lymphs Abs: 0.1 10*3/uL — ABNORMAL LOW (ref 0.7–4.0)
Monocytes Absolute: 0.4 10*3/uL (ref 0.1–1.0)
Monocytes Relative: 7 %
Neutro Abs: 4.2 10*3/uL (ref 1.7–7.7)
Neutrophils Relative %: 86 %

## 2019-02-09 LAB — RETICULOCYTES
Immature Retic Fract: 29.7 % — ABNORMAL HIGH (ref 2.3–15.9)
RBC.: 2.98 MIL/uL — ABNORMAL LOW (ref 4.22–5.81)
Retic Count, Absolute: 86.4 10*3/uL (ref 19.0–186.0)
Retic Ct Pct: 2.9 % (ref 0.4–3.1)

## 2019-02-09 LAB — CBC
HCT: 28 % — ABNORMAL LOW (ref 39.0–52.0)
Hemoglobin: 8.8 g/dL — ABNORMAL LOW (ref 13.0–17.0)
MCH: 29.5 pg (ref 26.0–34.0)
MCHC: 31.4 g/dL (ref 30.0–36.0)
MCV: 94 fL (ref 80.0–100.0)
Platelets: 108 10*3/uL — ABNORMAL LOW (ref 150–400)
RBC: 2.98 MIL/uL — ABNORMAL LOW (ref 4.22–5.81)
RDW: 18.9 % — ABNORMAL HIGH (ref 11.5–15.5)
WBC: 4.9 10*3/uL (ref 4.0–10.5)
nRBC: 0 % (ref 0.0–0.2)

## 2019-02-09 LAB — PREALBUMIN: Prealbumin: 6.5 mg/dL — ABNORMAL LOW (ref 18–38)

## 2019-02-09 LAB — GLUCOSE, CAPILLARY
Glucose-Capillary: 153 mg/dL — ABNORMAL HIGH (ref 70–99)
Glucose-Capillary: 170 mg/dL — ABNORMAL HIGH (ref 70–99)
Glucose-Capillary: 206 mg/dL — ABNORMAL HIGH (ref 70–99)
Glucose-Capillary: 228 mg/dL — ABNORMAL HIGH (ref 70–99)

## 2019-02-09 LAB — TSH: TSH: 0.417 u[IU]/mL (ref 0.350–4.500)

## 2019-02-09 LAB — FOLATE RBC
Folate, Hemolysate: 359 ng/mL
Folate, RBC: 1496 ng/mL (ref >498)
Hematocrit: 24 % — ABNORMAL LOW (ref 37.5–51.0)

## 2019-02-09 LAB — PREPARE RBC (CROSSMATCH)

## 2019-02-09 LAB — MAGNESIUM: Magnesium: 2.1 mg/dL (ref 1.7–2.4)

## 2019-02-09 LAB — PHOSPHORUS: Phosphorus: 2.9 mg/dL (ref 2.5–4.6)

## 2019-02-09 LAB — TRIGLYCERIDES: Triglycerides: 100 mg/dL (ref <150)

## 2019-02-09 MED ORDER — OXANDROLONE 2.5 MG PO TABS
2.5000 mg | ORAL_TABLET | Freq: Two times a day (BID) | ORAL | Status: DC
  Administered 2019-02-09 – 2019-02-12 (×6): 2.5 mg via ORAL
  Filled 2019-02-09 (×6): qty 1

## 2019-02-09 MED ORDER — SODIUM CHLORIDE 0.9% FLUSH
3.0000 mL | INTRAVENOUS | Status: AC | PRN
  Administered 2019-02-12: 04:00:00 3 mL

## 2019-02-09 MED ORDER — TRAVASOL 10 % IV SOLN
INTRAVENOUS | Status: AC
  Administered 2019-02-09: 17:00:00 via INTRAVENOUS
  Filled 2019-02-09: qty 528

## 2019-02-09 MED ORDER — ZINC SULFATE 220 (50 ZN) MG PO CAPS
220.0000 mg | ORAL_CAPSULE | Freq: Two times a day (BID) | ORAL | Status: DC
  Administered 2019-02-09 – 2019-03-06 (×47): 220 mg via ORAL
  Filled 2019-02-09 (×48): qty 1

## 2019-02-09 MED ORDER — SODIUM CHLORIDE 0.9 % IV SOLN
INTRAVENOUS | Status: AC
  Administered 2019-02-09: 21:00:00 via INTRAVENOUS

## 2019-02-09 MED ORDER — SODIUM CHLORIDE 0.9% IV SOLUTION
250.0000 mL | Freq: Once | INTRAVENOUS | Status: AC
  Administered 2019-02-09: 250 mL via INTRAVENOUS

## 2019-02-09 MED ORDER — HEPARIN SOD (PORK) LOCK FLUSH 100 UNIT/ML IV SOLN
500.0000 [IU] | Freq: Every day | INTRAVENOUS | Status: AC | PRN
  Administered 2019-02-20: 500 [IU]
  Filled 2019-02-09: qty 5

## 2019-02-09 MED ORDER — ACETAMINOPHEN 325 MG PO TABS
650.0000 mg | ORAL_TABLET | Freq: Once | ORAL | Status: AC
  Administered 2019-02-09: 650 mg via ORAL
  Filled 2019-02-09: qty 2

## 2019-02-09 MED ORDER — SODIUM CHLORIDE 0.9% FLUSH
10.0000 mL | INTRAVENOUS | Status: AC | PRN
  Administered 2019-02-14: 10 mL

## 2019-02-09 MED ORDER — HEPARIN SOD (PORK) LOCK FLUSH 100 UNIT/ML IV SOLN
250.0000 [IU] | INTRAVENOUS | Status: DC | PRN
  Filled 2019-02-09: qty 2.5

## 2019-02-09 MED ORDER — INSULIN ASPART 100 UNIT/ML ~~LOC~~ SOLN
0.0000 [IU] | Freq: Four times a day (QID) | SUBCUTANEOUS | Status: DC
  Administered 2019-02-09: 2 [IU] via SUBCUTANEOUS
  Administered 2019-02-09: 3 [IU] via SUBCUTANEOUS
  Administered 2019-02-10 (×2): 2 [IU] via SUBCUTANEOUS

## 2019-02-09 MED ORDER — APIXABAN 5 MG PO TABS
5.0000 mg | ORAL_TABLET | Freq: Two times a day (BID) | ORAL | Status: DC
  Administered 2019-02-09 – 2019-02-14 (×10): 5 mg via ORAL
  Filled 2019-02-09 (×10): qty 1

## 2019-02-09 NOTE — TOC Initial Note (Signed)
Transition of Care [facility]) - Initial/Assessment Note    Patient Details  Name: Mark Frey MRN: [id number] Date of Birth: 06-17-1956  Transition of Care [facility]) CM/SW Contact:    Dessa Phi, RN Phone Number: 02/09/2019, 1:04 PM  Clinical Narrative: Faxed out-awaiting bed offers.Has TPN, ileostomy.                  Expected Discharge Plan: Skilled Nursing Facility Barriers to Discharge: SNF Pending bed offer   Patient Goals and CMS Choice Patient states their goals for this hospitalization and ongoing recovery are:: go home CMS Medicare.gov Compare Post Acute Care list provided to:: Patient Choice offered to / list presented to : Patient  Expected Discharge Plan and Services Expected Discharge Plan: [place]   Discharge Planning Services: CM Consult Post Acute Care Choice: [place] Living arrangements for the past 2 months: [place] Expected Discharge Date: (unknown)                         HH Arranged: RN, PT [place] Agency: [place] ([place]) Date [place]: 02/08/19 Time [place]: 1244 Representative spoke with at [place]: Santiago Glad  Prior Living Arrangements/Services Living arrangements for the past 2 months: Single Family Home Lives with:: Spouse Patient language and need for interpreter reviewed:: Yes Do you feel safe going back to the place where you live?: Yes      Need for Family Participation in Patient Care: No (Comment) Care giver support system in place?: Yes (comment)   Criminal Activity/Legal Involvement Pertinent to Current Situation/Hospitalization: No - Comment as needed  Activities of Daily Living Home Assistive Devices/Equipment: Cane (specify quad or straight), Eyeglasses, CBG Meter, Walker (specify type), Other (Comment), Ostomy supplies(4 wheeled walker, single point cane, walk-in shower) ADL Screening (condition at time of admission) Patient's cognitive ability  adequate to safely complete daily activities?: Yes Is the patient deaf or have difficulty hearing?: No Does the patient have difficulty seeing, even when wearing glasses/contacts?: No Does the patient have difficulty concentrating, remembering, or making decisions?: Yes(at times) Patient able to express need for assistance with ADLs?: Yes Does the patient have difficulty dressing or bathing?: Yes Independently performs ADLs?: No Communication: Independent Dressing (OT): Dependent Is this a change from baseline?: Change from baseline, expected to last >3 days Grooming: Dependent Is this a change from baseline?: Change from baseline, expected to last >3 days Feeding: Dependent Is this a change from baseline?: Change from baseline, expected to last >3 days Bathing: Dependent Is this a change from baseline?: Change from baseline, expected to last >3 days Toileting: Dependent Is this a change from baseline?: Change from baseline, expected to last >3days In/Out Bed: Dependent Is this a change from baseline?: Change from baseline, expected to last >3 days Walks in Home: Dependent Is this a change from baseline?: Change from baseline, expected to last >3 days Does the patient have difficulty walking or climbing stairs?: Yes Weakness of Legs: Both Weakness of Arms/Hands: Both  Permission Sought/Granted Permission sought to share information with : Case Manager Permission granted to share information with : Yes, Verbal Permission Granted  Share Information with NAME: Samule Life [phone]  Permission granted to share info w AGENCY: SNF  Permission granted to share info w Relationship: spouse  Permission granted to share info w Contact Information: [phone]  Emotional Assessment Appearance:: Appears stated age Attitude/Demeanor/Rapport: Gracious Affect (typically observed): Accepting Orientation: :  Oriented to Self, Oriented to Place, Oriented to  Time, Oriented to  Situation Alcohol / Substance Use: Alcohol Use Psych Involvement: No (comment)  Admission diagnosis:  unknown Patient Active Problem List   Diagnosis Date Noted  . Symptomatic anemia   . Palliative care by specialist   . Goals of care, counseling/discussion   . Dehydration 02/07/2019  . Hypomagnesemia 01/11/2019  . Neutropenia ([place]) 01/11/2019  . Other neutropenia ([place])   . Pressure injury of skin 01/07/2019  . Zinc deficiency   . Malnutrition of moderate degree 01/04/2019  . Radiation gastroenteritis   . Radiation colitis   . Hyponatremia 01/01/2019  . Pancytopenia ([place]) 01/01/2019  . Hematochezia   . Abnormal CT of the abdomen   . Severe protein-calorie malnutrition ([place]) 08/19/2018  . 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 Diverticulitis of colon   . Low magnesium level 07/30/2018  . Hypokalemia 07/21/2018  . Bowel obstruction in setting of lymphoma and sigmoid stricture 07/21/2018  . Diarrhea 07/21/2018  . 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 Diabetes mellitus ([place])   . Edema   . Deep vein thrombosis (DVT) of femoral vein of right lower extremity ([place])   . Counseling regarding advance care planning and goals of care 04/25/2018  . Gastrointestinal hemorrhage   . Encounter for antineoplastic chemotherapy   . 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) 04/10/2018  . Port-A-Cath in place 03/27/2018  . Acute deep vein thrombosis (DVT) of femoral vein of right lower extremity ([place])   . 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 Bilateral pulmonary embolism ([place]) 03/10/2018  . Anemia 03/10/2018  . Hypoglycemia 03/10/2018  . Occult blood in stools 03/10/2018  . Abdominal mass 03/10/2018  . Tachycardia 01/31/2017  . Controlled diabetes mellitus type II without complication ([place]) 50/15/8682  . Hypertension 06/05/2014  . Irritable bowel syndrome 03/30/2010  . Morbid obesity ([place]) 12/29/2009  . Dysmetabolic syndrome X  57/49/3552  . BACK PAIN WITH RADICULOPATHY 06/09/2007  . Hyperlipidemia 05/12/2007  . ALLERGIC RHINITIS 05/12/2007   PCP:  [name], MD Pharmacy:   [id number] [place] [id number]: [phone]: [phone]    Social Determinants of Health (SDOH) Interventions    Readmission Risk Interventions Readmission Risk Prevention Plan 02/08/2019  Transportation Screening Complete  Medication Review (Press photographer Complete  PCP or Specialist appointment within 3-5 days of discharge Complete  HRI or H[place]Complete  SW Recovery Care/Counseling Consult Complete  PA[place]illaNot Applicable  Some recent data might be hidden

## 2019-02-09 NOTE — Progress Notes (Signed)
Nutrition Follow-up  DOCUMENTATION CODES:   Non-severe (moderate) malnutrition in context of chronic illness  INTERVENTION:   Monitor magnesium, potassium, and phosphorus daily for at least 3 days, MD to replete as needed, as pt is at risk for refeeding syndrome given poor PO intakes for at least a month now, significant weight loss and malnutrition status.  -TPN per Pharmacy  -Continue to encourage Ensure Enlive po BID, each supplement provides 350 kcal and 20 grams of protein -Continue to encourage Boost Breeze po BID, each supplement provides 250 kcal and 9 grams of protein -Multivitamin with minerals daily  NUTRITION DIAGNOSIS:   Increased nutrient needs related to chronic illness, cancer and cancer related treatments as evidenced by estimated needs.  Ongoing.  GOAL:   Patient will meet greater than or equal to 90% of their needs  Not meeting.  MONITOR:   PO intake, Supplement acceptance, Labs, Weight trends  REASON FOR ASSESSMENT:   Consult New TPN/TNA  ASSESSMENT:   63 y.o. male with medical history significant of Burkitt's lymphoma s/p radiation and chemotherapy, s/p ileostomy, Type 2 DM, radiation colitis, and PE/DVT on Eliquis. He presented to the ED with 3 week history of worsening weakness. At baseline, he is able to ambulate independently with a walker but more recently was unable to stand up on his own. The patient and his wife went to his PCP on 5/26 to discuss decline and to assess capacity. Patient, at that time was considering hospice referral, but has now opted for acute medical treatment.   **RD working [employer]  Per Oncology note, pt to begin TPN given poor nutrition status and expected bowel dysfunction for weeks. Pt with multiple nutritional deficiencies that are being repleted. Zinc and copper levels are being monitored by Oncology.   Will continue to monitor if patient will accept protein supplements. At this time has only accepted 1 Ensure.    Per Pharmacy note, pt to begin TPN at 40 ml/hr via port (providing 956 kcal, 52g protein).  Medications: Multivitamin with minerals daily, Zinc sulfate capsule BID Labs reviewed: CBGs: 153-167 Low Na Mg/Phos WNL  Diet Order:   Diet Order            Diet regular Room service appropriate? Yes; Fluid consistency: Thin  Diet effective now              EDUCATION NEEDS:   No education needs have been identified at this time  Skin:  Skin Assessment: Reviewed RN Assessment  Last BM:  5/29  Height:   Ht Readings from Last 1 Encounters:  02/08/19 5' 9"  (1.753 m)    Weight:   Wt Readings from Last 1 Encounters:  02/08/19 69 kg    Ideal Body Weight:  72.7 kg  BMI:  Body mass index is 22.46 kg/m.  Estimated Nutritional Needs:   Kcal:  2300-2500 kcal  Protein:  115-125 grams  Fluid:  >/= 2.2 L/day  Clayton Bibles, MS, RD, LDN [place] Dietitian Pager: [phone] After Hours Pager: [phone]

## 2019-02-09 NOTE — Progress Notes (Addendum)
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 Usual Weight: 190 lb - 15% weight loss in last month per RD  Insulin Requirements: n/a  Current Nutrition: regular?  IVF: NS at 100 ml/hr  Central access: Port TPN start date: 5/29  ASSESSMENT                                                                                                          HPI: 63 yo male with burkitt's lymphoma s/p chemo now with severe radiation colitis, prolonged malnutrition, multiple recent admissions to start TPN per pharmacy management  Significant events:   Today, 02/09/19  Glucose - goal < 150 while on TPN - current CBGs prior to start of TPN 153-189, note hx DM and chronic dexamethasone use  Electrolytes -WNL except Na 128  Renal - SCr 0.47  LFTs - AST/ALT 13/28  TGs - 100  Prealbumin - 6.5  NUTRITIONAL GOALS                                                                                             RD recs: 2300-2500 kcal/day, 115-125g protein/day  Custom TPN at goal of ml/hr to provide: 125 g/day protein, 2270Kcal/day.  PLAN                                                                                                                         At 1800 today:  Start custom TPN at rate of 40 ml/hr  Plan to advance as tolerated to the goal rate.  TPN to contain standard multivitamins daily and trace elements only on MWF due to [employer].  Standard TPN concentrations of lytes: except Na 75 mEq/L, PN:TIRW 1:2  Reduce IVF to 37m/hr.  Change SSI/CBGs to q6h  TPN lab panels on Mondays & Thursdays.   JAdrian Saran PharmD, BCPS 02/09/2019 8:33 AM

## 2019-02-09 NOTE — Progress Notes (Signed)
Daily Progress Note   Patient Name: Mark Frey       Date: 02/09/2019 DOB: [birth year]  Age: 63 y.o. MRN#: [id number] Attending Physician: Georgette Shell, MD Primary Care Physician: [name], MD Admit Date: 02/07/2019  Reason for Consultation/Follow-up: Establishing goals of care  Subjective:  patient is some what more conversant today, he is resting in bed.   He recalls meeting with Dr [name] yesterday, we discussed about continuing with current mode of care for now, also discussed about disposition options, see below.   Length of Stay: 1  Current Medications: Scheduled Meds:  . apixaban  2.5 mg Oral BID  . citalopram  10 mg Oral Daily  . dexamethasone  2 mg Oral Q breakfast  . feeding supplement  1 Container Oral BID BM  . feeding supplement (ENSURE ENLIVE)  237 mL Oral BID BM  . insulin aspart  0-9 Units Subcutaneous Q6H  . multivitamin with minerals  1 tablet Oral Daily  . zinc sulfate  220 mg Oral BID    Continuous Infusions: . sodium chloride     Followed by  . sodium chloride 10 mL/hr at 02/09/19 0616  . sodium chloride    . TPN ADULT (ION)      PRN Meds: acetaminophen **OR** acetaminophen, heparin lock flush, heparin lock flush, HYDROcodone-acetaminophen, ondansetron **OR** ondansetron (ZOFRAN) IV, sodium chloride flush, sodium chloride flush, sodium chloride flush  Physical Exam         Still with flat affect Resting in bed Has ostomy No edema Non focal Regular S1 S 2  Vital Signs: BP 96/66 (BP Location: Left Arm)   Pulse 68   Temp 97.7 F (36.5 C) (Oral)   Resp 18   Ht 5' 9"  (1.753 m)   Wt 69 kg   SpO2 98%   BMI 22.46 kg/m  SpO2: SpO2: 98 % O2 Device: O2 Device: Room Air O2 Flow Rate:    Intake/output summary:   Intake/Output  Summary (Last 24 hours) at 02/09/2019 1412 Last data filed at 02/09/2019 0616 Gross per 24 hour  Intake 1153.23 ml  Output 2300 ml  Net -1146.77 ml   LBM: Last BM Date: 02/08/19 Baseline Weight: Weight: 69 kg Most recent weight: Weight: 69 kg       Palliative Assessment/Data:    Flowsheet  Rows     Most Recent Value  Intake Tab  Referral Department  Hospitalist  Unit at Time of Referral  ER  Palliative Care Primary Diagnosis  Cancer  Date Notified  02/07/19  Palliative Care Type  New Palliative care  Reason for referral  Clarify Goals of Care  Date of Admission  02/07/19  Date first seen by Palliative Care  02/08/19  # of days Palliative referral response time  1 Day(s)  # of days IP prior to Palliative referral  0  Clinical Assessment  Psychosocial & Spiritual Assessment  Palliative Care Outcomes      Patient Active Problem List   Diagnosis Date Noted  . Symptomatic anemia   . Palliative care by specialist   . Goals of care, counseling/discussion   . Dehydration 02/07/2019  . Hypomagnesemia 01/11/2019  . Neutropenia ([place]) 01/11/2019  . Other neutropenia ([place])   . Pressure injury of skin 01/07/2019  . Zinc deficiency   . Malnutrition of moderate degree 01/04/2019  . Radiation gastroenteritis   . Radiation colitis   . Hyponatremia 01/01/2019  . Pancytopenia ([place]) 01/01/2019  . Hematochezia   . Abnormal CT of the abdomen   . Severe protein-calorie malnutrition ([place]) 08/19/2018  . 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 Diverticulitis of colon   . Low magnesium level 07/30/2018  . Hypokalemia 07/21/2018  . Bowel obstruction in setting of lymphoma and sigmoid stricture 07/21/2018  . Diarrhea 07/21/2018  . 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 Diabetes mellitus ([place])   . Edema   . Deep vein thrombosis (DVT) of femoral vein of right lower extremity ([place])   . Counseling regarding advance care planning and goals of care  04/25/2018  . Gastrointestinal hemorrhage   . Encounter for antineoplastic chemotherapy   . 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 Port-A-Cath in place 03/27/2018  . Acute deep vein thrombosis (DVT) of femoral vein of right lower extremity ([place])   . 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 Bilateral pulmonary embolism ([place]) 03/10/2018  . Anemia 03/10/2018  . Hypoglycemia 03/10/2018  . Occult blood in stools 03/10/2018  . Abdominal mass 03/10/2018  . Tachycardia 01/31/2017  . Controlled diabetes mellitus type II without complication ([place]) 00/86/7619  . Hypertension 06/05/2014  . Irritable bowel syndrome 03/30/2010  . Morbid obesity ([place]) 12/29/2009  . Dysmetabolic syndrome X 50/93/2671  . BACK PAIN WITH RADICULOPATHY 06/09/2007  . Hyperlipidemia 05/12/2007  . ALLERGIC RHINITIS 05/12/2007    Palliative Care Assessment & Plan   Patient Profile:    Assessment: High grade Burkitt's lymphoma, B cell lymphoma. Severe pan colitis, radiation toxicity Pancytopenia H/O DVT PE Failure to thrive Electrolyte abnormalities depression   Recommendations/Plan:   Agree with Celexa  Continue current mode of care  Discussed with patient about overall goals of care and disposition. He wishes to continue with current treatments, PT, also talked with him about [facility].   will request care manager consult for facilitating inpatient rehab.   Code Status:    Code Status Orders  (From admission, onward)         Start     Ordered   02/07/19 1355  Do not attempt resuscitation (DNR)  Continuous    Question Answer Comment  In the event of cardiac or respiratory ARREST Do not call a "code blue"   In the event of cardiac or respiratory ARREST Do not perform Intubation, CPR,  defibrillation or ACLS   In the event of cardiac or respiratory ARREST Use medication by any route, position, wound care, and other measures to relive pain and  suffering. May use oxygen, suction and manual treatment of airway obstruction as needed for comfort.      02/07/19 1354        Code Status History    Date Active Date Inactive Code Status Order ID Comments User Context   02/07/2019 1336 02/07/2019 1354 DNR [id number]  Mariel Aloe, MD Inpatient   01/01/2019 1254 01/16/2019 2130 Full Code [id number]  Debbe Odea, MD ED   08/18/2018 0146 08/28/2018 1729 Full Code [id number]  [name], MD ED   07/30/2018 2133 08/04/2018 1735 Full Code [id number]  [name], MD Inpatient   07/21/2018 1458 07/24/2018 1551 Full Code [id number]  [name], MD ED   07/03/2018 1053 07/07/2018 1645 Full Code [id number]  [name], MD Inpatient   06/12/2018 1113 06/16/2018 1542 Full Code [id number]  [name], MD Inpatient   05/22/2018 1036 05/26/2018 1644 Full Code [id number]  [name], MD Inpatient   05/01/2018 0959 05/05/2018 2317 Full Code [id number]  [name], MD Inpatient   04/10/2018 1604 04/14/2018 1640 Full Code [id number]  [name], MD Inpatient   03/10/2018 2125 03/25/2018 2126 Full Code [id number]  [name], MD Inpatient       Prognosis:   Unable to determine  Discharge Planning:  To Be Determined  Care plan was discussed with  Patient.   Thank you for allowing the Palliative Medicine Team to assist in the care of this patient.   Time In: 1300 Time Out: 1335 Total Time 35 Prolonged Time Billed  no       Greater than 50%  of this time was spent counseling and coordinating care related to the above assessment and plan.  Loistine Chance, MD [phone] Please contact Palliative Medicine Team phone at [phone] for questions and concerns.

## 2019-02-09 NOTE — Progress Notes (Addendum)
[name]   HEMATOLOGY/ONCOLOGY INPATIENT PROGRESS NOTE  Date of Service: 02/09/2019  Inpatient Attending: .Georgette Shell, MD   SUBJECTIVE  Patient seen this afternoon.  Appears much brighter and is more talkative.  Is scheduled to start TPN today.  Notes that he had some sausage for breakfast today.  Notes that he feels a little better overall. Spent 15 to 20 minutes giving his wife [name] and update and answering her multiple questions. No abdominal pain.  No other acute new symptoms.  OBJECTIVE:  Appears fatigued and with temporal wasting.  PHYSICAL EXAMINATION  .BP 96/66 (BP Location: Left Arm)   Pulse 68   Temp 97.7 F (36.5 C) (Oral)   Resp 18   Ht 5' 9"  (1.753 m)   Wt 152 lb 1.9 oz (69 kg)   SpO2 98%   BMI 22.46 kg/m  . GENERAL:alert, in no acute distress SKIN: no acute rashes, no significant lesions EYES: conjunctiva are pink and non-injected, sclera anicteric OROPHARYNX: MMM NECK: supple, no JVD LYMPH:  no palpable lymphadenopathy in the cervical, axillary or inguinal regions LUNGS: clear to auscultation b/l with normal respiratory effort HEART: regular rate & rhythm ABDOMEN:  Ileostomy in situ, no tenderness to palpation. Extremity: no pedal edema PSYCH: alert & oriented x 3 with fluent speech NEURO: no focal motor/sensory deficits      MEDICAL HISTORY:  Past Medical History:  Diagnosis Date  . ALLERGIC RHINITIS   . Cancer ([place])    Lymphoma   . Diabetes mellitus   . Hyperlipidemia     SURGICAL HISTORY: Past Surgical History:  Procedure Laterality Date  . BIOPSY  03/15/2018   Procedure: BIOPSY;  Surgeon: Milus Banister, MD;  Location: [name];  Service: Endoscopy;;  . BOWEL RESECTION N/A 08/25/2018   Procedure: LAPROSCOPIC LOOP ILEOSTOMY;  Surgeon: Greer Pickerel, MD;  Location: [facility];  Service: General;  Laterality: N/A;  . CLEFT PALATE REPAIR    . COLONOSCOPY N/A 03/15/2018   Procedure: COLONOSCOPY;  Surgeon: Milus Banister, MD;   Location: [facility];  Service: Endoscopy;  Laterality: N/A;  . COLONOSCOPY N/A 08/20/2018   Procedure: COLONOSCOPY;  Surgeon: Irene Shipper, MD;  Location: [facility];  Service: Endoscopy;  Laterality: N/A;  . deviated septum repair     slight improvement  . ESOPHAGOGASTRODUODENOSCOPY N/A 03/15/2018   Procedure: ESOPHAGOGASTRODUODENOSCOPY (EGD);  Surgeon: Milus Banister, MD;  Location: [name];  Service: Endoscopy;  Laterality: N/A;  . IR IMAGING GUIDED PORT INSERTION  03/17/2018  . LAPAROSCOPY N/A 08/25/2018   Procedure: LAPAROSCOPY DIAGNOSTIC;  Surgeon: Greer Pickerel, MD;  Location: [facility];  Service: General;  Laterality: N/A;  . TONSILLECTOMY      SOCIAL HISTORY: Social History   Socioeconomic History  . Marital status: Married    Spouse name: lisa  . Number of children: 0  . Years of education: Not on file  . Highest education level: Not on file  Occupational History  . Not on file  Social Needs  . Financial resource strain: Not hard at all  . Food insecurity:    Worry: Never true    Inability: Never true  . Transportation needs:    Medical: No    Non-medical: No  Tobacco Use  . Smoking status: Never Smoker  . Smokeless tobacco: Never Used  Substance and Sexual Activity  . Alcohol use: Yes    Comment: occasional  . Drug use: No  . Sexual activity: Yes  Lifestyle  . Physical activity:  Days per week: 0 days    Minutes per session: 0 min  . Stress: Not at all  Relationships  . Social connections:    Talks on phone: More than three times a week    Gets together: More than three times a week    Attends religious service: 1 to 4 times per year    Active member of club or organization: No    Attends meetings of clubs or organizations: Never    Relationship status: Married  . Intimate partner violence:    Fear of current or ex partner: No    Emotionally abused: No    Physically abused: No    Forced sexual activity: No  Other Topics Concern  . Not on  file  Social History Narrative   Married 1985. No kids. 4 small dogs.       Works in Financial trader, residential      Hobbies: work on cars, Haematologist, exercise as able    FAMILY HISTORY: Family History  Problem Relation Age of Onset  . Lung cancer Mother        smoker  . Brain cancer Mother        metastasis  . AAA (abdominal aortic aneurysm) Father        smoker    ALLERGIES:  is allergic to ciprofloxacin.  MEDICATIONS:  Scheduled Meds: . apixaban  2.5 mg Oral BID  . citalopram  10 mg Oral Daily  . dexamethasone  2 mg Oral Q breakfast  . feeding supplement  1 Container Oral BID BM  . feeding supplement (ENSURE ENLIVE)  237 mL Oral BID BM  . insulin aspart  0-9 Units Subcutaneous Q6H  . multivitamin with minerals  1 tablet Oral Daily  . zinc sulfate  220 mg Oral BID   Continuous Infusions: . sodium chloride     Followed by  . sodium chloride 10 mL/hr at 02/09/19 0616  . sodium chloride    . TPN ADULT (ION)     PRN Meds:.acetaminophen **OR** acetaminophen, heparin lock flush, heparin lock flush, HYDROcodone-acetaminophen, ondansetron **OR** ondansetron (ZOFRAN) IV, sodium chloride flush, sodium chloride flush, sodium chloride flush  REVIEW OF SYSTEMS:    10 Point review of Systems was done is negative except as noted above.   LABORATORY DATA:  I have reviewed the data as listed  . CBC Latest Ref Rng & Units 02/09/2019 02/08/2019 02/08/2019  WBC 4.0 - 10.5 K/uL 4.9 - 5.6  Hemoglobin 13.0 - 17.0 g/dL 8.8(L) 7.9(L) 7.0(L)  Hematocrit 39.0 - 52.0 % 28.0(L) 24.5(L) 22.4(L)  Platelets 150 - 400 K/uL 108(L) - 131(L)   . CMP Latest Ref Rng & Units 02/09/2019 02/08/2019 02/08/2019  Glucose 70 - 99 mg/dL 186(H) 191(H) 183(H)  BUN 8 - 23 mg/dL 12 11 13   Creatinine 0.61 - 1.24 mg/dL 0.47(L) 0.53(L) 0.71  Sodium 135 - 145 mmol/L 128(L) 125(L) 127(L)  Potassium 3.5 - 5.1 mmol/L 3.7 3.7 3.5  Chloride 98 - 111 mmol/L 98 95(L) 97(L)  CO2 22 - 32 mmol/L 20(L) 22 19(L)   Calcium 8.9 - 10.3 mg/dL 8.3(L) 8.1(L) 8.3(L)  Total Protein 6.5 - 8.1 g/dL 5.2(L) - -  Total Bilirubin 0.3 - 1.2 mg/dL 0.8 - -  Alkaline Phos 38 - 126 U/L 114 - -  AST 15 - 41 U/L 13(L) - -  ALT 0 - 44 U/L 28 - -   Component     Latest Ref Rng & Units 02/09/2019  Sodium  135 - 145 mmol/L 128 (L)  Potassium     3.5 - 5.1 mmol/L 3.7  Chloride     98 - 111 mmol/L 98  CO2     22 - 32 mmol/L 20 (L)  Glucose     70 - 99 mg/dL 186 (H)  BUN     8 - 23 mg/dL 12  Creatinine     0.61 - 1.24 mg/dL 0.47 (L)  Calcium     8.9 - 10.3 mg/dL 8.3 (L)  Total Protein     6.5 - 8.1 g/dL 5.2 (L)  Albumin     3.5 - 5.0 g/dL 2.1 (L)  AST     15 - 41 U/L 13 (L)  ALT     0 - 44 U/L 28  Alkaline Phosphatase     38 - 126 U/L 114  Total Bilirubin     0.3 - 1.2 mg/dL 0.8  GFR, Est Non African American     >60 mL/min >60  GFR, Est African American     >60 mL/min >60  Anion gap     5 - 15 10  Neutrophils     % 86  NEUT#     1.7 - 7.7 K/uL 4.2  Lymphocytes     % 2  Lymphocyte #     0.7 - 4.0 K/uL 0.1 (L)  Monocytes Relative     % 7  Monocyte #     0.1 - 1.0 K/uL 0.4  Eosinophil     % 0  Eosinophils Absolute     0.0 - 0.5 K/uL 0.0  Basophil     % 1  Basophils Absolute     0.0 - 0.1 K/uL 0.0  WBC Morphology      MILD LEFT SHIFT (1-5% METAS, OCC MYELO, OCC BANDS)  Immature Granulocytes     % 4  Abs Immature Granulocytes     0.00 - 0.07 K/uL 0.17 (H)  WBC     4.0 - 10.5 K/uL 4.9  RBC     4.22 - 5.81 MIL/uL 2.98 (L)  Hemoglobin     13.0 - 17.0 g/dL 8.8 (L)  HCT     39.0 - 52.0 % 28.0 (L)  MCV     80.0 - 100.0 fL 94.0  MCH     26.0 - 34.0 pg 29.5  MCHC     30.0 - 36.0 g/dL 31.4  RDW     11.5 - 15.5 % 18.9 (H)  Platelets     150 - 400 K/uL 108 (L)  nRBC     0.0 - 0.2 % 0.0  Retic Ct Pct     0.4 - 3.1 % 2.9  RBC.     4.22 - 5.81 MIL/uL 2.98 (L)  Retic Count, Absolute     19.0 - 186.0 K/uL 86.4  Immature Retic Fract     2.3 - 15.9 % 29.7 (H)  Magnesium      1.7 - 2.4 mg/dL 2.1  Phosphorus     2.5 - 4.6 mg/dL 2.9  Triglycerides     <150 mg/dL 100  TSH     0.350 - 4.500 uIU/mL 0.417  PREALBUMIN     18 - 38 mg/dL 6.5 (L)    RADIOGRAPHIC STUDIES: I have personally reviewed the radiological images as listed and agreed with the findings in the report. No results found.  ASSESSMENT & PLAN:   1. 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  3. H/o Small bowel obstruction/ileus with sigmoid thickening/stenosis . 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 EGreer Pickerel 4. 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 GI panel and C. difficile negative.  CMV IgM negative, EBV IgM negative. 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 Sed rate and CRP still elevated  5.Hypovolemic hyponatremia. Other electrolyte issues including hypokalemia and hypomagnesemia  6. Pancytopenia -  Anemia likely multifactorial but could be from GI bleeding plus significant inflammation from severe radiation colitis. Leukopenia/lymphopenia likely from radiation toxicity.-resolved Thrombocytopenia from consumption related to GI bleeding and from radiation toxicity.- nearly normalized. B12 within normal limits Copper levels within normal limits at 132 Zinc deficiency noted- 44 Viral work-up not revealing Low likelihood of Burkitt's lymphoma progression at this time.  7. Admitted with failure to thrive, dehydration electrolyte abnormalities and hyperglycemia with poor p.o. intake. 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  8. Depression - discussed with patient. Continue Celexa and optimize dose to 261min 1-2 weeks.  9. Symptomatic anemia -transfuse prn to maintain hgb>8 given symptoms and some GI losses.  10.  Moderae-severe protein calorie malnutrition and muscle waste -optimize nutrition -will check testosterone levels - add Oxandrolone 2.81m73mo BID and will increase dose as needed -increase eliquis back to 81mg39m BID in the setting of improved plt counts no overt bleeding and some increased risk of VTE with decreased ambulation and adding oxandrolone. Plan -Appreciate excellent hospitalist cares. -IVF, potassium and magnesemia replacement -continue Zinc replacement - starting TPN today given limited po intake and expected bowel dysfunction for the next 4-8 weeks -more talkative with me today for goals of care and finally said " let do it the doctors way". -optimize DM2 management- might need addition of basal insulin. -will check testosterone levels - add Oxandrolone 2.81mg 54mBID and will increase dose as needed -increase eliquis back to 81mg p28mID in the setting of improved plt counts no overt bleeding and some increased risk of VTE with decreased ambulation and adding oxandrolone. -may need to consider ESA for anemia or chronic disease -transfuse to hgb of 8 given symptoms. -Will likely need SNF on discharge -patient open to this idea today.  I spent 30 minutes counseling the patient face to face. The total time spent in the appointment was 40 minutes and more than 50% was on counseling and direct patient cares.    GautamSullivan Lone AAHSuperiorMS SCH CTOut[facility] Nussaology/Oncology Physician [facility]Prom[facility] Hospitalice):       [phone]):  [phone]           [phone]

## 2019-02-09 NOTE — Progress Notes (Signed)
Rehab Admissions Coordinator Note:  Patient was screened by Cleatrice Burke for appropriateness for an Inpatient Acute Rehab Consult per Dr. Rowe Pavy, palliative MD, I reviewed pt's abilities with therapy assessment and pt is total assist. Patient is not at a level to be able to tolerate the intensity of an inpt rehab admit.  At this time, we are recommending [facility].  Cleatrice Burke RN MSN 02/09/2019, 3:34 PM  I can be reached at [phone].

## 2019-02-09 NOTE — NC FL2 (Signed)
[place] LEVEL OF CARE SCREENING TOOL     IDENTIFICATION  Patient Name: Mark Frey Birthdate: [birth year] Sex: male Admission Date (Current Location): 02/07/2019  [facility] and [place] Number:  Herbalist and Address:  [facility],  [address], [place]      Provider Number: [id number]  Attending Physician Name and Address:  Georgette Shell, MD  Relative Name and Phone Number:  Deyton Ellenbecker [phone]    Current Level of Care: SNF Recommended Level of Care: [place] Prior Approval Number:    Date Approved/Denied:   PASRR Number: [id number]  Discharge Plan: SNF    Current Diagnoses: Patient Active Problem List   Diagnosis Date Noted  . Symptomatic anemia   . Palliative care by specialist   . Goals of care, counseling/discussion   . Dehydration 02/07/2019  . Hypomagnesemia 01/11/2019  . Neutropenia ([place]) 01/11/2019  . Other neutropenia ([place])   . Pressure injury of skin 01/07/2019  . Zinc deficiency   . Malnutrition of moderate degree 01/04/2019  . Radiation gastroenteritis   . Radiation colitis   . Hyponatremia 01/01/2019  . Pancytopenia ([place]) 01/01/2019  . Hematochezia   . Abnormal CT of the abdomen   . Severe protein-calorie malnutrition ([place]) 08/19/2018  . 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 Diverticulitis of colon   . Low magnesium level 07/30/2018  . Hypokalemia 07/21/2018  . Bowel obstruction in setting of lymphoma and sigmoid stricture 07/21/2018  . Diarrhea 07/21/2018  . Burkitt lymphoma, lymph nodes of multiple sites ([place]) 05/22/2018  . Diabetes mellitus ([place])   . Edema   . Deep vein thrombosis (DVT) of femoral vein of right lower extremity ([place])   . Counseling regarding advance care planning and goals of care 04/25/2018  . Gastrointestinal hemorrhage   . Encounter for antineoplastic chemotherapy   . 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[place])  04/10/2018  . Port-A-Cath in place 03/27/2018  . Acute deep vein thrombosis (DVT) of femoral vein of right lower extremity ([place])   . 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[place]) 03/15/2018  . Bilateral pulmonary embolism ([place]) 03/10/2018  . Anemia 03/10/2018  . Hypoglycemia 03/10/2018  . Occult blood in stools 03/10/2018  . Abdominal mass 03/10/2018  . Tachycardia 01/31/2017  . Controlled diabetes mellitus type II without complication ([place]) 63/87/5643  . Hypertension 06/05/2014  . Irritable bowel syndrome 03/30/2010  . Morbid obesity ([place]) 12/29/2009  . Dysmetabolic syndrome X 32/95/1884  . BACK PAIN WITH RADICULOPATHY 06/09/2007  . Hyperlipidemia 05/12/2007  . ALLERGIC RHINITIS 05/12/2007    Orientation RESPIRATION BLADDER Height & Weight     Self, Time, Situation, Place  Normal Continent Weight: 69 kg Height:  5' 9"  (175.3 cm)  BEHAVIORAL SYMPTOMS/MOOD NEUROLOGICAL BOWEL NUTRITION STATUS      Ileostomy TNA(Porta cath for TPN started on 02/09/19 will continue for a month)  AMBULATORY STATUS COMMUNICATION OF NEEDS Skin   Limited Assist Verbally Normal                       Personal Care Assistance Level of Assistance  Bathing, Feeding, Dressing Bathing Assistance: Limited assistance Feeding assistance: Limited assistance Dressing Assistance: Limited assistance     Functional Limitations Info  Sight Sight Info: Impaired(reading glasses)        SPECIAL CARE FACTORS FREQUENCY  PT (By licensed PT), OT (By licensed OT)     PT Frequency: 5x week OT  Frequency: 5x week            Contractures      Additional Factors Info  Allergies Code Status Info: DNR Allergies Info: Ciprofloxacin           Current Medications (02/09/2019):  This is the current hospital active medication list Current Facility-Administered Medications  Medication Dose Route Frequency Provider Last Rate Last Dose  . sodium chloride 0.9 % bolus 1,000 mL  1,000 mL Intravenous Once Mariel Aloe, MD        Followed by  . 0.9 %  sodium chloride infusion   Intravenous Continuous Adrian Saran, RPH 10 mL/hr at 02/09/19 2542    . 0.9 %  sodium chloride infusion   Intravenous Continuous Adrian Saran, RPH      . acetaminophen (TYLENOL) tablet 650 mg  650 mg Oral Q6H PRN Mariel Aloe, MD       Or  . acetaminophen (TYLENOL) suppository 650 mg  650 mg Rectal Q6H PRN Mariel Aloe, MD      . apixaban Arne Cleveland) tablet 2.5 mg  2.5 mg Oral BID Mariel Aloe, MD   2.5 mg at 02/09/19 0943  . citalopram (CELEXA) tablet 10 mg  10 mg Oral Daily [name], MD   10 mg at 02/09/19 0944  . dexamethasone (DECADRON) tablet 2 mg  2 mg Oral Q breakfast [name], MD   2 mg at 02/09/19 0943  . feeding supplement (BOOST / RESOURCE BREEZE) liquid 1 Container  1 Container Oral BID BM Georgette Shell, MD      . feeding supplement (ENSURE ENLIVE) (ENSURE ENLIVE) liquid 237 mL  237 mL Oral BID BM Georgette Shell, MD      . heparin lock flush 100 unit/mL  500 Units Intracatheter Daily PRN [name], MD      . heparin lock flush 100 unit/mL  250 Units Intracatheter PRN [name], MD      . HYDROcodone-acetaminophen (NORCO/VICODIN) 5-325 MG per tablet 1-2 tablet  1-2 tablet Oral Q4H PRN Mariel Aloe, MD   2 tablet at 02/08/19 1526  . insulin aspart (novoLOG) injection 0-9 Units  0-9 Units Subcutaneous Q6H Arlyn Dunning M, RPH      . multivitamin with minerals tablet 1 tablet  1 tablet Oral Daily Georgette Shell, MD   1 tablet at 02/09/19 [phone]  . ondansetron (ZOFRAN) tablet 4 mg  4 mg Oral Q6H PRN Mariel Aloe, MD       Or  . ondansetron [facility]) injection 4 mg  4 mg Intravenous Q6H PRN Mariel Aloe, MD      . sodium chloride flush (NS) 0.9 % injection 10 mL  10 mL Intracatheter PRN [name], MD      . sodium chloride flush (NS) 0.9 % injection 10-40 mL  10-40 mL Intracatheter PRN Mariel Aloe, MD   10 mL at 02/07/19 1741  . sodium  chloride flush (NS) 0.9 % injection 3 mL  3 mL Intracatheter PRN [name], MD      . TPN ADULT (ION)   Intravenous Continuous TPN Adrian Saran, [facility]      . zinc sulfate capsule 220 mg  220 mg Oral BID [name], MD   220 mg at 02/09/19 3762     Discharge Medications: Please see discharge summary for a list of discharge medications.  Relevant Imaging Results:  Relevant Lab  Results:   Additional Information [phone] [place], [place]

## 2019-02-09 NOTE — Progress Notes (Signed)
PROGRESS NOTE    Mark Frey  GYJ:[id number] DOB: 04-04-1956 DOA: 02/07/2019 PCP: [name], MD Brief Narrative:62 y.o.malewith medical history significant ofBurkitt's lymphoma status post radiation and chemotherapy,s/p ileostomy, diabetes mellitus, type 2, radiation colitis, PE/DVT on Eliquis.History provided mostly by chart review and wife discussion as patient was not too interested in providing history. Patient presents with three weeks of worsening weakness. At baseline, he is able to ambulate independently with a walker but more recently was unable to stand up on his own. The patient and his wife went to his PCP on 5/26 to discuss decline and to assess capacity. Patient, at that time was considering hospice referral, but has now opted for acute medical treatment.  ED Course: Vitals:Temperature of 99 F, pulse of 110, respirations 18, blood pressure 104/74, SPO2 of 100% on room air. Labs:Sodium of 125, potassium of 3, CO2 of 19, glucose of 329, anion gap of 16, alkaline phosphatase of 141, lactic acid of 3.6, hemoglobin of 8.5, platelets of 128 Imaging:None Medications/Course:IV fluids, morphine   Assessment & Plan:   Active Problems:   Diabetes mellitus ([place])   Burkitt lymphoma, lymph nodes of multiple sites ([place])   Hyponatremia   Malnutrition of moderate degree   Dehydration   Palliative care by specialist   Goals of care, counseling/discussion   Symptomatic anemia   Failure to thrive/dehydration/malnutrition secondary to poor poor p.o. intake as well as high ostomy output.    Patient will be starting on TPN today through his port.  TPN was started for poor nutritional status and expected bowel dysfunction for weeks to months.  Will need to closely monitor for refeeding syndrome.  Check electrolytes daily and correct.  Encourage p.o. intake.  Physical therapy recommends SNF and patient is agreeable to the plan.    Multiple electrolyte abnormalities  secondary to dehydration decreased p.o. intake and increased ostomy output including hyponatremia hypokalemia and hypomagnesemia which is being replaced.  Patient with known zinc deficiency normal copper levels and normal B12 from review of Dr. Grier Mitts notes.  Serum 3.7 magnesium 2.1 sodium 128 today.  Type 2 diabetes continue sliding scale insulin and monitor blood sugar Janumet was DC'd recently.   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 to see the patient today.  Palliative care consulted.  Patient requires continues hospital stay to treat dehydration and minimal po intake.  Patient with ongoing increased output from the colostomy and very minimal p.o. intake.  TPN to be started today.     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  Continues to have mucus drainage to the rectum.  GI panel C. difficile panel negative.  CMV Epstein-Barr virus negative.  Anemia-pancytopenia-thought to be secondary to GI bleeding plus significant inflammation from severe radiation colitis.  Patient received 1 unit of blood transfusion 02/08/2019.hb 8.8  DVT prophylaxis:Eliquis Code Status:DNR Family Communication dw wife YOVZ [phone] Disposition Plan:to snf when ready Consults called:Palliative care medicine,onc   Pressure Injury 01/04/19 Stage II -  Partial thickness loss of dermis presenting as a shallow open ulcer with a red, pink wound bed without slough. redness around wound bed (Active)  01/04/19 0930  Location:   Location Orientation: Left;Right;Medial  Staging: Stage II -  Partial thickness loss of dermis presenting as a shallow open ulcer with a red, pink wound bed without slough.  Wound Description (Comments): redness around wound bed  Present on Admission:  Yes      Nutrition Problem: Increased nutrient needs Etiology: chronic illness, cancer and cancer related  treatments     Signs/Symptoms: estimated needs    Interventions: Ensure Enlive (each supplement provides 350kcal and 20 grams of protein), Boost Breeze, TPN, MVI  Estimated body mass index is 22.46 kg/m as calculated from the following:   Height as of this encounter: 5' 9"  (1.753 m).   Weight as of this encounter: 69 kg.    Subjective: He is much more calmer today agreeable and wanting to speak with me and wants help.  Agreeable to go to SNF.  Objective: Vitals:   02/09/19 0119 02/09/19 0140 02/09/19 0335 02/09/19 0633  BP: 102/75 102/68 101/73 96/66  Pulse: 78 81 71 68  Resp: 17 16 17 18   Temp: 98.1 F (36.7 C) 97.8 F (36.6 C) 98 F (36.7 C) 97.7 F (36.5 C)  TempSrc: Oral Oral Oral Oral  SpO2: 98% 97% 97% 98%  Weight:      Height:        Intake/Output Summary (Last 24 hours) at 02/09/2019 1134 Last data filed at 02/09/2019 0616 Gross per 24 hour  Intake 1812.81 ml  Output 2300 ml  Net -487.19 ml   Filed Weights   02/08/19 1726  Weight: 69 kg    Examination:  General exam: Appears calm and comfortable  Respiratory system: Clear to auscultation. Respiratory effort normal.port in place Cardiovascular system: S1 & S2 heard, RRR. No JVD, murmurs, rubs, gallops or clicks. No pedal edema. Gastrointestinal system: Abdomen is nondistended, soft and nontender. No organomegaly or masses felt. Normal bowel sounds heard.colostomy in place draining liquid stool. Central nervous system: Alert and oriented. No focal neurological deficits. Extremities: Symmetric 5 x 5 power. Skin: No rashes, lesions or ulcers Psychiatry: Judgement and insight appear normal. Mood & affect appropriate.     Data Reviewed: I have personally reviewed following labs and imaging studies  CBC: Recent Labs  Lab 02/07/19 1014 02/08/19 0553 02/08/19 1635 02/09/19 0412  WBC 6.3 5.6  --  4.9  NEUTROABS 5.4  --   --  4.2  HGB 8.5* 7.0* 7.9* 8.8*  HCT 25.9* 22.4* 24.5* 28.0*  MCV 94.9  97.4  --  94.0  PLT 128* 131*  --  707*   Basic Metabolic Panel: Recent Labs  Lab 02/07/19 1014  02/07/19 2323 02/08/19 0553 02/08/19 1140 02/08/19 1635 02/09/19 0412  NA 125*   < > 128* 127* 127* 125* 128*  K 3.0*   < > 3.3* 3.2* 3.5 3.7 3.7  CL 90*   < > 98 98 97* 95* 98  CO2 19*   < > 22 21* 19* 22 20*  GLUCOSE 329*   < > 154* 174* 183* 191* 186*  BUN 19   < > 15 11 13 11 12   CREATININE 0.67   < > 0.55* 0.51* 0.71 0.53* 0.47*  CALCIUM 8.7*   < > 8.5* 8.2* 8.3* 8.1* 8.3*  MG 1.9  --   --   --   --   --  2.1  PHOS  --   --   --   --   --   --  2.9   < > = values in this interval not displayed.   GFR: Estimated Creatinine Clearance: 93.4 mL/min (A) (by C-G formula based on SCr of 0.47 mg/dL (L)). Liver Function Tests: Recent Labs  Lab 02/07/19 1014 02/09/19 0412  AST 15 13*  ALT 30 28  ALKPHOS 141* 114  BILITOT 0.8 0.8  PROT 5.9* 5.2*  ALBUMIN 2.5* 2.1*   No results for input(s): LIPASE, AMYLASE in the last 168 hours. No results for input(s): AMMONIA in the last 168 hours. Coagulation Profile: No results for input(s): INR, PROTIME in the last 168 hours. Cardiac Enzymes: No results for input(s): CKTOTAL, CKMB, CKMBINDEX, TROPONINI in the last 168 hours. BNP (last 3 results) No results for input(s): PROBNP in the last 8760 hours. HbA1C: No results for input(s): HGBA1C in the last 72 hours. CBG: Recent Labs  Lab 02/08/19 1151 02/08/19 1558 02/08/19 1616 02/08/19 2028 02/09/19 0747  GLUCAP 155* 167* 189* 167* 153*   Lipid Profile: Recent Labs    02/09/19 0412  TRIG 100   Thyroid Function Tests: Recent Labs    02/09/19 0412  TSH 0.417   Anemia Panel: Recent Labs    02/08/19 1635 02/09/19 0412  VITAMINB12 1,393*  --   FERRITIN 2,935*  --   RETICCTPCT  --  2.9   Sepsis Labs: Recent Labs  Lab 02/07/19 1014 02/07/19 1303  LATICACIDVEN 3.6* 2.4*    Recent Results (from the past 240 hour(s))  Blood culture (routine x 2)     Status: None  (Preliminary result)   Collection Time: 02/07/19 10:14 AM  Result Value Ref Range Status   Specimen Description   Final    BLOOD LEFT ANTECUBITAL Performed at [facility], [place] [address] [place], [place] [address]    Special Requests   Final    BOTTLES DRAWN AEROBIC AND ANAEROBIC Blood Culture adequate volume Performed at [place] [address], [place], [place] [address]    Culture   Final    NO GROWTH 1 DAY Performed at [facility], [place] [address] [place], [place] [address]    Report Status PENDING  Incomplete  SARS Coronavirus 2 (CEPHEID - Performed in [place] hospital lab), Hosp Order     Status: None   Collection Time: 02/07/19 10:14 AM  Result Value Ref Range Status   SARS Coronavirus 2 NEGATIVE NEGATIVE Final    Comment: (NOTE) If result is NEGATIVE SARS-CoV-2 target nucleic acids are NOT DETECTED. The SARS-CoV-2 RNA is generally detectable in upper and lower  respiratory specimens during the acute phase of infection. The lowest  concentration of SARS-CoV-2 viral copies this assay can detect is 250  copies / mL. A negative result does not preclude SARS-CoV-2 infection  and should not be used as the sole basis for treatment or other  patient management decisions.  A negative result may occur with  improper specimen collection / handling, submission of specimen other  than nasopharyngeal swab, presence of viral mutation(s) within the  areas targeted by this assay, and inadequate number of viral copies  (<250 copies / mL). A negative result must be combined with clinical  observations, patient history, and epidemiological information. If result is POSITIVE SARS-CoV-2 target nucleic acids are DETECTED. The SARS-CoV-2 RNA is generally detectable in upper and lower  respiratory specimens dur ing the acute phase of infection.  Positive  results are indicative of active infection with SARS-CoV-2.  Clinical  correlation  with patient history and other diagnostic information is  necessary to determine patient infection status.  Positive results do  not rule out bacterial infection or co-infection with other viruses. If result is PRESUMPTIVE POSTIVE SARS-CoV-2 nucleic acids MAY BE PRESENT.   A presumptive positive result was obtained on the submitted specimen  and confirmed on repeat testing.  While 2019 novel coronavirus  (SARS-CoV-2) nucleic acids may be present in the submitted sample  additional confirmatory testing may be necessary for epidemiological  and / or clinical management purposes  to differentiate between  SARS-CoV-2 and other Sarbecovirus currently known to infect humans.  If clinically indicated additional testing with an alternate test  methodology [phone]) is advised. The SARS-CoV-2 RNA is generally  detectable in upper and lower respiratory sp ecimens during the acute  phase of infection. The expected result is Negative. Fact Sheet for Patients:  StrictlyIdeas.no Fact Sheet for Healthcare Providers: BankingDealers.co.za This test is not yet approved or cleared by the [place] FDA and has been authorized for detection and/or diagnosis of SARS-CoV-2 by FDA under an Emergency Use Authorization (EUA).  This EUA will remain in effect (meaning this test can be used) for the duration of the COVID-19 declaration under Section 564(b)(1) of the Act, 21 U.S.C. section 360bbb-3(b)(1), unless the authorization is terminated or revoked sooner. Performed at [facility], [place] [address] [place], [place] [address]   Blood culture (routine x 2)     Status: None (Preliminary result)   Collection Time: 02/07/19 11:03 AM  Result Value Ref Range Status   Specimen Description   Final    BLOOD LEFT HAND Performed at [place] [address] [place], [place] [address]    Special Requests   Final    BOTTLES  DRAWN AEROBIC AND ANAEROBIC Blood Culture adequate volume Performed at [place] [address] [place], [place] [address]    Culture   Final    NO GROWTH 1 DAY Performed at [facility], [place] [address], [place], [place] [address]    Report Status PENDING  Incomplete  Urine culture     Status: None   Collection Time: 02/07/19  4:52 PM  Result Value Ref Range Status   Specimen Description   Final    URINE, RANDOM Performed at [place] [address] [place], [place] [address]    Special Requests   Final    NONE Performed at [facility], [place] [address], [place], [place] [address]    Culture   Final    NO GROWTH Performed at [facility], [place] [address] [place],  [address]    Report Status 02/08/2019 FINAL  Final         Radiology Studies: No results found.      Scheduled Meds: . apixaban  2.5 mg Oral BID  . citalopram  10 mg Oral Daily  . dexamethasone  2 mg Oral Q breakfast  . feeding supplement  1 Container Oral BID BM  . feeding supplement (ENSURE ENLIVE)  237 mL Oral BID BM  . insulin aspart  0-9 Units Subcutaneous Q6H  . multivitamin with minerals  1 tablet Oral Daily  . zinc sulfate  220 mg Oral BID   Continuous Infusions: . sodium chloride     Followed by  . sodium chloride 10 mL/hr at 02/09/19 0616  . sodium chloride    . TPN ADULT (ION)       LOS: 1 day     Georgette Shell, MD [facility] If 7PM-7AM, please contact night-coverage [email or web address] Password [facility] 02/09/2019, 11:34 AM

## 2019-02-10 DIAGNOSIS — F329 Major depressive disorder, single episode, unspecified: Secondary | ICD-10-CM

## 2019-02-10 DIAGNOSIS — F32A Depression, unspecified: Secondary | ICD-10-CM

## 2019-02-10 LAB — COMPREHENSIVE METABOLIC PANEL
ALT: 24 U/L (ref 0–44)
AST: 11 U/L — ABNORMAL LOW (ref 15–41)
Albumin: 2 g/dL — ABNORMAL LOW (ref 3.5–5.0)
Alkaline Phosphatase: 96 U/L (ref 38–126)
Anion gap: 10 (ref 5–15)
BUN: 12 mg/dL (ref 8–23)
CO2: 22 mmol/L (ref 22–32)
Calcium: 8.1 mg/dL — ABNORMAL LOW (ref 8.9–10.3)
Chloride: 96 mmol/L — ABNORMAL LOW (ref 98–111)
Creatinine, Ser: 0.47 mg/dL — ABNORMAL LOW (ref 0.61–1.24)
GFR calc Af Amer: >60 mL/min (ref >60)
GFR calc non Af Amer: >60 mL/min (ref >60)
Glucose, Bld: 171 mg/dL — ABNORMAL HIGH (ref 70–99)
Potassium: 3.3 mmol/L — ABNORMAL LOW (ref 3.5–5.1)
Sodium: 128 mmol/L — ABNORMAL LOW (ref 135–145)
Total Bilirubin: 0.5 mg/dL (ref 0.3–1.2)
Total Protein: 5.2 g/dL — ABNORMAL LOW (ref 6.5–8.1)

## 2019-02-10 LAB — CBC WITH DIFFERENTIAL/PLATELET
Abs Immature Granulocytes: 0.11 10*3/uL — ABNORMAL HIGH (ref 0.00–0.07)
Basophils Absolute: 0 10*3/uL (ref 0.0–0.1)
Basophils Relative: 1 %
Eosinophils Absolute: 0 10*3/uL (ref 0.0–0.5)
Eosinophils Relative: 0 %
HCT: 27.4 % — ABNORMAL LOW (ref 39.0–52.0)
Hemoglobin: 8.8 g/dL — ABNORMAL LOW (ref 13.0–17.0)
Immature Granulocytes: 3 %
Lymphocytes Relative: 3 %
Lymphs Abs: 0.1 10*3/uL — ABNORMAL LOW (ref 0.7–4.0)
MCH: 29.9 pg (ref 26.0–34.0)
MCHC: 32.1 g/dL (ref 30.0–36.0)
MCV: 93.2 fL (ref 80.0–100.0)
Monocytes Absolute: 0.3 10*3/uL (ref 0.1–1.0)
Monocytes Relative: 7 %
Neutro Abs: 3.3 10*3/uL (ref 1.7–7.7)
Neutrophils Relative %: 86 %
Platelets: 100 10*3/uL — ABNORMAL LOW (ref 150–400)
RBC: 2.94 MIL/uL — ABNORMAL LOW (ref 4.22–5.81)
RDW: 18.8 % — ABNORMAL HIGH (ref 11.5–15.5)
WBC: 3.9 10*3/uL — ABNORMAL LOW (ref 4.0–10.5)
nRBC: 0 % (ref 0.0–0.2)

## 2019-02-10 LAB — PHOSPHORUS: Phosphorus: 2.7 mg/dL (ref 2.5–4.6)

## 2019-02-10 LAB — MAGNESIUM: Magnesium: 1.8 mg/dL (ref 1.7–2.4)

## 2019-02-10 LAB — GLUCOSE, CAPILLARY: Glucose-Capillary: 267 mg/dL — ABNORMAL HIGH (ref 70–99)

## 2019-02-10 MED ORDER — TRAVASOL 10 % IV SOLN: INTRAVENOUS | Status: DC

## 2019-02-10 MED ORDER — POTASSIUM CHLORIDE 10 MEQ/100ML IV SOLN
10.0000 meq | INTRAVENOUS | Status: AC
  Administered 2019-02-10 (×4): 10 meq via INTRAVENOUS
  Filled 2019-02-10 (×4): qty 100

## 2019-02-10 MED ORDER — SODIUM CHLORIDE 0.9 % IV SOLN: INTRAVENOUS | Status: DC

## 2019-02-10 MED ORDER — INSULIN GLARGINE 100 UNIT/ML ~~LOC~~ SOLN
5.0000 [IU] | Freq: Every day | SUBCUTANEOUS | Status: DC
  Administered 2019-02-10: 5 [IU] via SUBCUTANEOUS
  Filled 2019-02-10: qty 0.05

## 2019-02-10 MED ORDER — INSULIN ASPART 100 UNIT/ML ~~LOC~~ SOLN
0.0000 [IU] | SUBCUTANEOUS | Status: DC
  Administered 2019-02-10: 16:00:00 5 [IU] via SUBCUTANEOUS
  Administered 2019-02-10: 8 [IU] via SUBCUTANEOUS
  Administered 2019-02-10: 13:00:00 3 [IU] via SUBCUTANEOUS
  Administered 2019-02-11: 5 [IU] via SUBCUTANEOUS
  Administered 2019-02-11: 3 [IU] via SUBCUTANEOUS
  Administered 2019-02-11: 21:00:00 5 [IU] via SUBCUTANEOUS
  Administered 2019-02-11: 3 [IU] via SUBCUTANEOUS
  Administered 2019-02-11: 17:00:00 5 [IU] via SUBCUTANEOUS
  Administered 2019-02-11: 3 [IU] via SUBCUTANEOUS
  Administered 2019-02-12: 8 [IU] via SUBCUTANEOUS
  Administered 2019-02-12: 3 [IU] via SUBCUTANEOUS
  Administered 2019-02-12: 5 [IU] via SUBCUTANEOUS
  Administered 2019-02-12: 8 [IU] via SUBCUTANEOUS
  Administered 2019-02-12: 3 [IU] via SUBCUTANEOUS
  Administered 2019-02-12 – 2019-02-13 (×2): 2 [IU] via SUBCUTANEOUS
  Administered 2019-02-13: 5 [IU] via SUBCUTANEOUS
  Administered 2019-02-13: 11 [IU] via SUBCUTANEOUS
  Administered 2019-02-13 – 2019-02-14 (×3): 3 [IU] via SUBCUTANEOUS
  Administered 2019-02-14: 5 [IU] via SUBCUTANEOUS
  Administered 2019-02-14: 3 [IU] via SUBCUTANEOUS
  Administered 2019-02-14: 2 [IU] via SUBCUTANEOUS
  Administered 2019-02-14: 3 [IU] via SUBCUTANEOUS
  Administered 2019-02-14 – 2019-02-15 (×4): 2 [IU] via SUBCUTANEOUS

## 2019-02-10 MED ORDER — TRAVASOL 10 % IV SOLN
INTRAVENOUS | Status: AC
  Administered 2019-02-10: 18:00:00 via INTRAVENOUS
  Filled 2019-02-10: qty 924

## 2019-02-10 MED ORDER — MAGNESIUM SULFATE 2 GM/50ML IV SOLN
2.0000 g | Freq: Once | INTRAVENOUS | Status: AC
  Administered 2019-02-10: 2 g via INTRAVENOUS
  Filled 2019-02-10: qty 50

## 2019-02-10 NOTE — Progress Notes (Signed)
PROGRESS NOTE    Mark Frey  ZHY:[id number] DOB: 1955-11-23 DOA: 02/07/2019 PCP: [name], MD  Brief Narrative: 63 y.o.malewith medical history significant ofBurkitt's lymphoma status post radiation and chemotherapy,s/p ileostomy, diabetes mellitus, type 2, radiation colitis, PE/DVT on Eliquis.History provided mostly by chart review and wife discussion as patient was not too interested in providing history. Patient presents with three weeks of worsening weakness. At baseline, he is able to ambulate independently with a walker but more recently was unable to stand up on his own. The patient and his wife went to his PCP on 5/26 to discuss decline and to assess capacity. Patient, at that time was considering hospice referral, but has now opted for acute medical treatment.  ED Course: Vitals:Temperature of 99 F, pulse of 110, respirations 18, blood pressure 104/74, SPO2 of 100% on room air. Labs:Sodium of 125, potassium of 3, CO2 of 19, glucose of 329, anion gap of 16, alkaline phosphatase of 141, lactic acid of 3.6, hemoglobin of 8.5, platelets of 128 Imaging:None Medications/Course:IV fluids, morphine Assessment & Plan:   Active Problems:   Diabetes mellitus (HCC)   Burkitt lymphoma, lymph nodes of multiple sites ([place])   Hyponatremia   Malnutrition of moderate degree   Dehydration   Palliative care by specialist   Goals of care, counseling/discussion   Symptomatic anemia   Atrophy of muscle of multiple sites  Failure to thrive/dehydration/malnutrition secondary to poor poor p.o. intake as well as high ostomy output.   Patient was started on TPN 02/09/2019 through his port.  TPN was started for poor nutritional status and expected bowel dysfunction for weeks to months.  Will need to closely monitor for refeeding syndrome.  Check electrolytes daily and correct.  Encourage p.o. intake.  Physical therapy recommends SNF and patient is agreeable to the plan.  started on  oxandrolone,testosterone level pending  Multiple electrolyte abnormalities secondary to dehydration decreased p.o. intake and increased ostomy output including hyponatremia hypokalemia and hypomagnesemia which is being replaced.  Patient with known zinc deficiency normal copper levels and normal B12 from review of Dr. Grier Mitts notes.  Serum 3.7 magnesium 2.1 sodium 128 today.  Normal phosphorus.monitor NA on celexa  Type 2 diabetes continue sliding scale insulin sugar starting to trend up with a starting of TPN.  I will place him on small dose of Lantus daily.    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  History of high-grade Burkitt's lymphoma status post radiation and chemo Dr.kaleto see the patient today. Palliative care following.  Patient requires continues hospital stay to treat dehydration and minimal po intake.Patient with ongoing increased output from the colostomy and very minimal p.o. intake.  TPN was started.   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  Continues to have mucus drainage to the rectum.  GI panel C. difficile panel negative.  CMV Epstein-Barr virus negative.  Anemia-pancytopenia-thought to be secondary to GI bleeding plus significant inflammation from severe radiation colitis.  Patient received 1 unit of blood transfusion 02/08/2019.hb 8.8  Depression started on Celexa.  DVT prophylaxis:Eliquis Code Status:DNR Family Communication dw wife EXBM [phone] daily Disposition Plan:to snf when ready started on TPN 02/09/2019 monitoring electrolytes Consults called:Palliative care medicine,onc     Pressure Injury 01/04/19 Stage II -  Partial thickness loss of dermis presenting as a shallow open ulcer with a red, pink wound bed without slough. redness around wound bed (Active)  01/04/19 0930  Location:   Location Orientation: Left;Right;Medial  Staging: Stage II  -  Partial thickness loss of dermis presenting as a shallow open ulcer with a red, pink wound bed without slough.  Wound Description (Comments): redness around wound bed  Present on Admission: Yes     Pressure Injury 02/10/19 Sacrum Mid Stage II -  Partial thickness loss of dermis presenting as a shallow open ulcer with a red, pink wound bed without slough. partial thickness of dermis, first layer of skin off presenting red/pink in color (Active)  02/10/19 1043  Location: Sacrum  Location Orientation: Mid  Staging: Stage II -  Partial thickness loss of dermis presenting as a shallow open ulcer with a red, pink wound bed without slough.  Wound Description (Comments): partial thickness of dermis, first layer of skin off presenting red/pink in color  Present on Admission:       Nutrition Problem: Increased nutrient needs Etiology: chronic illness, cancer and cancer related treatments     Signs/Symptoms: estimated needs    Interventions: Ensure Enlive (each supplement provides 350kcal and 20 grams of protein), Boost Breeze, TPN, MVI  Estimated body mass index is 22.46 kg/m as calculated from the following:   Height as of this encounter: 5' 9"  (1.753 m).   Weight as of this encounter: 69 kg.   Subjective: Resting in bed   Objective: Vitals:   02/09/19 0633 02/09/19 2045 02/10/19 0543 02/10/19 1254  BP: 96/66 110/68 115/65 103/71  Pulse: 68 87 91 100  Resp: 18 18 18 18   Temp: 97.7 F (36.5 C) 98.2 F (36.8 C) 98.5 F (36.9 C) 98.6 F (37 C)  TempSrc: Oral Oral Oral Oral  SpO2: 98% 98% 98% 99%  Weight:      Height:        Intake/Output Summary (Last 24 hours) at 02/10/2019 1257 Last data filed at 02/10/2019 1036 Gross per 24 hour  Intake 1580.21 ml  Output 1625 ml  Net -44.79 ml   Filed Weights   02/08/19 1726  Weight: 69 kg    Examination:  General exam: Appears calm and comfortable  Respiratory system: Clear to auscultation. Respiratory effort normal.  Cardiovascular system: S1 & S2 heard, RRR. No JVD, murmurs, rubs, gallops or clicks. No pedal edema. Gastrointestinal system: Abdomen is nondistended, soft and nontender. No organomegaly or masses felt. Normal bowel sounds heard.ostomy with liquid stool Central nervous system: Alert and oriented. No focal neurological deficits. Extremities: Symmetric 5 x 5 power. Skin: No rashes, lesions or ulcers Psychiatry: Judgement and insight appear normal. Mood & affect appropriate.     Data Reviewed: I have personally reviewed following labs and imaging studies  CBC: Recent Labs  Lab 02/07/19 1014 02/08/19 0553 02/08/19 1635 02/09/19 0412 02/10/19 0359  WBC 6.3 5.6  --  4.9 3.9*  NEUTROABS 5.4  --   --  4.2 3.3  HGB 8.5* 7.0* 7.9* 8.8* 8.8*  HCT 25.9* 22.4* 24.5*  24.0* 28.0* 27.4*  MCV 94.9 97.4  --  94.0 93.2  PLT 128* 131*  --  108* 161*   Basic Metabolic Panel: Recent Labs  Lab 02/07/19 1014  02/08/19 0553 02/08/19 1140 02/08/19 1635 02/09/19 0412 02/10/19 0359  NA 125*   < > 127* 127* 125* 128* 128*  K 3.0*   < > 3.2* 3.5 3.7 3.7 3.3*  CL 90*   < > 98 97* 95* 98 96*  CO2 19*   < > 21* 19* 22 20* 22  GLUCOSE  329*   < > 174* 183* 191* 186* 171*  BUN 19   < > 11 13 11 12 12   CREATININE 0.67   < > 0.51* 0.71 0.53* 0.47* 0.47*  CALCIUM 8.7*   < > 8.2* 8.3* 8.1* 8.3* 8.1*  MG 1.9  --   --   --   --  2.1 1.8  PHOS  --   --   --   --   --  2.9 2.7   < > = values in this interval not displayed.   GFR: Estimated Creatinine Clearance: 93.4 mL/min (A) (by C-G formula based on SCr of 0.47 mg/dL (L)). Liver Function Tests: Recent Labs  Lab 02/07/19 1014 02/09/19 0412 02/10/19 0359  AST 15 13* 11*  ALT 30 28 24   ALKPHOS 141* 114 96  BILITOT 0.8 0.8 0.5  PROT 5.9* 5.2* 5.2*  ALBUMIN 2.5* 2.1* 2.0*   No results for input(s): LIPASE, AMYLASE in the last 168 hours. No results for input(s): AMMONIA in the last 168 hours. Coagulation Profile: No results for input(s): INR,  PROTIME in the last 168 hours. Cardiac Enzymes: No results for input(s): CKTOTAL, CKMB, CKMBINDEX, TROPONINI in the last 168 hours. BNP (last 3 results) No results for input(s): PROBNP in the last 8760 hours. HbA1C: No results for input(s): HGBA1C in the last 72 hours. CBG: Recent Labs  Lab 02/08/19 2028 02/09/19 0747 02/09/19 1201 02/09/19 1714 02/09/19 2043  GLUCAP 167* 153* 170* 206* 228*   Lipid Profile: Recent Labs    02/09/19 0412  TRIG 100   Thyroid Function Tests: Recent Labs    02/09/19 0412  TSH 0.417   Anemia Panel: Recent Labs    02/08/19 1635 02/09/19 0412  VITAMINB12 1,393*  --   FERRITIN 2,935*  --   RETICCTPCT  --  2.9   Sepsis Labs: Recent Labs  Lab 02/07/19 1014 02/07/19 1303  LATICACIDVEN 3.6* 2.4*    Recent Results (from the past 240 hour(s))  Blood culture (routine x 2)     Status: None (Preliminary result)   Collection Time: 02/07/19 10:14 AM  Result Value Ref Range Status   Specimen Description   Final    BLOOD LEFT ANTECUBITAL Performed at [facility], [place] [address], [place], [place] [address]    Special Requests   Final    BOTTLES DRAWN AEROBIC AND ANAEROBIC Blood Culture adequate volume Performed at [facility] [address] [place], [place] [address]    Culture   Final    NO GROWTH 2 DAYS Performed at [facility] [address] [place], [place] [address]    Report Status PENDING  Incomplete  SARS Coronavirus 2 (CEPHEID - Performed in [place] hospital lab), Hosp Order     Status: None   Collection Time: 02/07/19 10:14 AM  Result Value Ref Range Status   SARS Coronavirus 2 NEGATIVE NEGATIVE Final    Comment: (NOTE) If result is NEGATIVE SARS-CoV-2 target nucleic acids are NOT DETECTED. The SARS-CoV-2 RNA is generally detectable in upper and lower  respiratory specimens during the acute phase of infection. The lowest  concentration of SARS-CoV-2 viral copies this  assay can detect is 250  copies / mL. A negative result does not preclude SARS-CoV-2 infection  and should not be used as the sole basis for treatment or other  patient management decisions.  A negative result may occur with  improper specimen collection / handling, submission of specimen other  than nasopharyngeal swab, presence of viral mutation(s) within the  areas targeted by this assay, and inadequate number of viral copies  (<250 copies / mL). A negative result must be combined with clinical  observations, patient history, and epidemiological information. If result is POSITIVE SARS-CoV-2 target nucleic acids are DETECTED. The SARS-CoV-2 RNA is generally detectable in upper and lower  respiratory specimens dur ing the acute phase of infection.  Positive  results are indicative of active infection with SARS-CoV-2.  Clinical  correlation with patient history and other diagnostic information is  necessary to determine patient infection status.  Positive results do  not rule out bacterial infection or co-infection with other viruses. If result is PRESUMPTIVE POSTIVE SARS-CoV-2 nucleic acids MAY BE PRESENT.   A presumptive positive result was obtained on the submitted specimen  and confirmed on repeat testing.  While 2019 novel coronavirus  (SARS-CoV-2) nucleic acids may be present in the submitted sample  additional confirmatory testing may be necessary for epidemiological  and / or clinical management purposes  to differentiate between  SARS-CoV-2 and other Sarbecovirus currently known to infect humans.  If clinically indicated additional testing with an alternate test  methodology [phone]) is advised. The SARS-CoV-2 RNA is generally  detectable in upper and lower respiratory sp ecimens during the acute  phase of infection. The expected result is Negative. Fact Sheet for Patients:  StrictlyIdeas.no Fact Sheet for Healthcare Providers:  BankingDealers.co.za This test is not yet approved or cleared by the [place] FDA and has been authorized for detection and/or diagnosis of SARS-CoV-2 by FDA under an Emergency Use Authorization (EUA).  This EUA will remain in effect (meaning this test can be used) for the duration of the COVID-19 declaration under Section 564(b)(1) of the Act, 21 U.S.C. section 360bbb-3(b)(1), unless the authorization is terminated or revoked sooner. Performed at [facility], [place], [place] [address], [place], [place] [address]   Blood culture (routine x 2)     Status: None (Preliminary result)   Collection Time: 02/07/19 11:03 AM  Result Value Ref Range Status   Specimen Description   Final    BLOOD LEFT HAND Performed at [facility] [address], [place], [place] [address]    Special Requests   Final    BOTTLES DRAWN AEROBIC AND ANAEROBIC Blood Culture adequate volume Performed at [facility] [address], [place], [place] [address]    Culture   Final    NO GROWTH 2 DAYS Performed at [facility] [address] [place], [place] [address]    Report Status PENDING  Incomplete  Urine culture     Status: None   Collection Time: 02/07/19  4:52 PM  Result Value Ref Range Status   Specimen Description   Final    URINE, RANDOM Performed at [facility] [address], [place], [place] [address]    Special Requests   Final    NONE Performed at [facility], [place] [address], [place], [place] [address]    Culture   Final    NO GROWTH Performed at [facility], [place] [address], [place], [place] [address]    Report Status 02/08/2019 FINAL  Final         Radiology Studies: No results found.      Scheduled Meds: . apixaban  5 mg Oral BID  . citalopram  10 mg Oral Daily  . dexamethasone  2 mg Oral Q breakfast  .  feeding supplement  1 Container Oral BID BM   . feeding supplement (ENSURE ENLIVE)  237 mL Oral BID BM  . insulin aspart  0-15 Units Subcutaneous Q4H  . oxandrolone  2.5 mg Oral BID  . zinc sulfate  220 mg Oral BID   Continuous Infusions: . sodium chloride 50 mL/hr at 02/10/19 0600  . sodium chloride    . potassium chloride 10 mEq (02/10/19 1144)  . sodium chloride    . TPN ADULT (ION) 40 mL/hr at 02/10/19 0600  . TPN ADULT (ION)       LOS: 2 days    Georgette Shell, MD [facility]  If 7PM-7AM, please contact night-coverage [email or web address] Password [email or web address] 02/10/2019, 12:57 PM

## 2019-02-10 NOTE — Progress Notes (Signed)
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 Usual Weight: 190 lb - 15% weight loss in last month per RD  Insulin Requirements: 4 units given since start of TPN  Current Nutrition: regular but not eating per charting  IVF: NS at 50 ml/hr  Central access: Port TPN start date: 5/29  ASSESSMENT                                                                                                          HPI: 63 yo male with burkitt's lymphoma s/p chemo now with severe radiation colitis, prolonged malnutrition, multiple recent admissions to start TPN per pharmacy management  Significant events:   Today, 02/10/19  Glucose - goal < 150 while on TPN - since TPN started, elevated at 206 and 228. note hx DM and chronic dexamethasone use  Electrolytes -WNL except Na 128 and K 3.3 - will replete  Renal - SCr 0.47  LFTs - AST/ALT stable  TGs - 100  Prealbumin - 6.5  NUTRITIONAL GOALS                                                                                             RD recs: 2300-2500 kcal/day, 115-125g protein/day  Custom TPN at goal of 95 ml/hr to provide: 125 g/day protein, 2270Kcal/day.  PLAN                                                                                                                         Now:  4 runs IV KCL  At 1800 today:  Continue custom TPN and increase from rate 40 ml/hr to 70 ml/hr  Plan to advance as tolerated to the goal rate.  TPN to contain standard multivitamins daily and trace elements only on MWF due to [employer].  Standard TPN concentrations of lytes: except increase Na to 125 mEq/L,  increase K to  65, CV:UDTH 1:1  Reduce IVF to [facility].  Change SSI/CBGs to q4h and change to moderate scale. Also add 10 units insulin to TPN  TPN lab panels on Mondays & Thursdays.   Adrian Saran, PharmD, BCPS 02/10/2019 9:20 AM

## 2019-02-11 DIAGNOSIS — I82411 Acute embolism and thrombosis of right femoral vein: Secondary | ICD-10-CM

## 2019-02-11 LAB — CBC
HCT: 26 % — ABNORMAL LOW (ref 39.0–52.0)
Hemoglobin: 8 g/dL — ABNORMAL LOW (ref 13.0–17.0)
MCH: 29.5 pg (ref 26.0–34.0)
MCHC: 30.8 g/dL (ref 30.0–36.0)
MCV: 95.9 fL (ref 80.0–100.0)
Platelets: 93 10*3/uL — ABNORMAL LOW (ref 150–400)
RBC: 2.71 MIL/uL — ABNORMAL LOW (ref 4.22–5.81)
RDW: 19.2 % — ABNORMAL HIGH (ref 11.5–15.5)
WBC: 3.4 10*3/uL — ABNORMAL LOW (ref 4.0–10.5)
nRBC: 0 % (ref 0.0–0.2)

## 2019-02-11 LAB — COMPREHENSIVE METABOLIC PANEL
ALT: 24 U/L (ref 0–44)
AST: 11 U/L — ABNORMAL LOW (ref 15–41)
Albumin: 1.9 g/dL — ABNORMAL LOW (ref 3.5–5.0)
Alkaline Phosphatase: 89 U/L (ref 38–126)
Anion gap: 7 (ref 5–15)
BUN: 14 mg/dL (ref 8–23)
CO2: 24 mmol/L (ref 22–32)
Calcium: 8.2 mg/dL — ABNORMAL LOW (ref 8.9–10.3)
Chloride: 100 mmol/L (ref 98–111)
Creatinine, Ser: 0.37 mg/dL — ABNORMAL LOW (ref 0.61–1.24)
GFR calc Af Amer: >60 mL/min (ref >60)
GFR calc non Af Amer: >60 mL/min (ref >60)
Glucose, Bld: 205 mg/dL — ABNORMAL HIGH (ref 70–99)
Potassium: 4.1 mmol/L (ref 3.5–5.1)
Sodium: 131 mmol/L — ABNORMAL LOW (ref 135–145)
Total Bilirubin: 0.3 mg/dL (ref 0.3–1.2)
Total Protein: 4.9 g/dL — ABNORMAL LOW (ref 6.5–8.1)

## 2019-02-11 LAB — TYPE AND SCREEN
ABO/RH(D): O POS
Antibody Screen: NEGATIVE
Unit division: 0
Unit division: 0

## 2019-02-11 LAB — BPAM RBC
Blood Product Expiration Date: 202006252359
Blood Product Expiration Date: 202006262359
ISSUE DATE / TIME: 202005281212
ISSUE DATE / TIME: 202005290108
Unit Type and Rh: 5100
Unit Type and Rh: 5100

## 2019-02-11 LAB — MAGNESIUM: Magnesium: 2 mg/dL (ref 1.7–2.4)

## 2019-02-11 LAB — PHOSPHORUS: Phosphorus: 3.1 mg/dL (ref 2.5–4.6)

## 2019-02-11 LAB — GLUCOSE, CAPILLARY
Glucose-Capillary: 170 mg/dL — ABNORMAL HIGH (ref 70–99)
Glucose-Capillary: 179 mg/dL — ABNORMAL HIGH (ref 70–99)
Glucose-Capillary: 181 mg/dL — ABNORMAL HIGH (ref 70–99)
Glucose-Capillary: 183 mg/dL — ABNORMAL HIGH (ref 70–99)
Glucose-Capillary: 189 mg/dL — ABNORMAL HIGH (ref 70–99)
Glucose-Capillary: 208 mg/dL — ABNORMAL HIGH (ref 70–99)
Glucose-Capillary: 222 mg/dL — ABNORMAL HIGH (ref 70–99)
Glucose-Capillary: 223 mg/dL — ABNORMAL HIGH (ref 70–99)

## 2019-02-11 LAB — ZINC: Zinc: 60 ug/dL (ref 56–134)

## 2019-02-11 MED ORDER — TRAVASOL 10 % IV SOLN
INTRAVENOUS | Status: AC
  Administered 2019-02-11: 18:00:00 via INTRAVENOUS
  Filled 2019-02-11: qty 924

## 2019-02-11 NOTE — Evaluation (Signed)
Occupational Therapy Evaluation Patient Details Name: Mark Frey MRN: [id number] DOB: [birth year] Today's Date: 02/11/2019    History of Present Illness 63 y.o. 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, s/p ileostomy, diabetes mellitus, type 2, radiation colitis, PE/DVT on Eliquis and admitted for dehydration and generalized weakness   Clinical Impression   Pt admitted with above diagnoses, with decreased activity tolerance and generalized weakness limiting ability to engage in BADL at desired level of ind. At start of session, pt appeared to be urinating in drink cup. PTA pt living with wife and having increased difficulty completing functional transfers and BADL. At time of eval, pt requiring min A- Min +2 for dynamic and functional transfers. Pt presenting with flat affect, minimally conversive and irritable at times. This improved by end of session. Pt completed household functional mobility and up in chair at end of session. At this time, recommend SNF at d/c to maximize safety and functional mobility in BADL. Will follow per POC.    Follow Up Recommendations  SNF;Supervision/Assistance - 24 hour    Equipment Recommendations  Other (comment)(defer to next venue)    Recommendations for Other Services       Precautions / Restrictions Precautions Precautions: Fall Precaution Comments: ileostomy Restrictions Weight Bearing Restrictions: No      Mobility Bed Mobility Overal bed mobility: Needs Assistance Bed Mobility: Supine to Sit;Rolling Rolling: Min assist   Supine to sit: Min assist        Transfers Overall transfer level: Needs assistance Equipment used: Rolling walker (2 wheeled) Transfers: Sit to/from Stand Sit to Stand: Min assist;+2 safety/equipment         General transfer comment: cues for hand placement; cues for initial positioning into standing    Balance Overall balance assessment: Needs  assistance Sitting-balance support: No upper extremity supported;Feet supported Sitting balance-Leahy Scale: Good     Standing balance support: Bilateral upper extremity supported Standing balance-Leahy Scale: Poor                             ADL either performed or assessed with clinical judgement   ADL Overall ADL's : Needs assistance/impaired Eating/Feeding: Minimal assistance;Cueing for safety;Sitting;Bed level Eating/Feeding Details (indicate cue type and reason): upon arrival pt appeared to be urinating in drink cup- min A for safety and monitoring Grooming: Minimal assistance;Standing   Upper Body Bathing: Minimal assistance;Sitting   Lower Body Bathing: Moderate assistance;Sit to/from stand;Sitting/lateral leans   Upper Body Dressing : Minimal assistance;Sitting   Lower Body Dressing: Moderate assistance;Sit to/from stand;Sitting/lateral leans   Toilet Transfer: Minimal assistance;BSC;Grab bars;RW Armed forces technical officer Details (indicate cue type and reason): has ileostomy   Toileting - Clothing Manipulation Details (indicate cue type and reason): ileostomy; recieves assist for this Tub/ Shower Transfer: Minimal assistance;Shower seat;Tub bench;3 in 1;Rolling walker   Functional mobility during ADLs: Min guard;Minimal assistance;Rolling walker General ADL Comments: pt completed bed mobility; household distance functional mobility and chair transfer     Vision Patient Visual Report: No change from baseline       Perception     Praxis      Pertinent Vitals/Pain Pain Assessment: Faces Faces Pain Scale: Hurts little more Pain Location: sacral area where wound is Pain Descriptors / Indicators: Aching;Sore Pain Intervention(s): Limited activity within patient's tolerance;Repositioned;Other (comment)(dressing changed and pillow added in chair)     Hand Dominance     Extremity/Trunk Assessment Upper Extremity Assessment Upper Extremity  Assessment:  Generalized weakness   Lower Extremity Assessment Lower Extremity Assessment: Generalized weakness       Communication Communication Communication: No difficulties   Cognition Arousal/Alertness: Awake/alert Behavior During Therapy: Flat affect Overall Cognitive Status: Within Functional Limits for tasks assessed                                 General Comments: occasionally slow to respond and minimally conversive; can be occaisonally irritable   General Comments       Exercises     Shoulder Instructions      Home Living Family/patient expects to be discharged to:: Private residence Living Arrangements: Spouse/significant other Available Help at Discharge: Family;Available 24 hours/day Type of Home: House Home Access: Stairs to enter [employer] of Steps: 2 Entrance Stairs-Rails: None Home Layout: One level     Bathroom Shower/Tub: Walk-in Hydrologist: Standard     Home Equipment: Cane - single point;Walker - 4 wheels   Additional Comments: from previous admission      Prior Functioning/Environment Level of Independence: Independent with assistive device(s)        Comments: walks with rollator; independent bathing/dressing        OT Problem List: Decreased strength;Decreased activity tolerance;Decreased knowledge of use of DME or AE;Impaired balance (sitting and/or standing);Decreased coordination;Decreased safety awareness;Pain      OT Treatment/Interventions: Self-care/ADL training;DME and/or AE instruction;Therapeutic activities;Balance training;Therapeutic exercise;Energy conservation;Patient/family education    OT Goals(Current goals can be found in the care plan section) Acute Rehab OT Goals Patient Stated Goal: none stated this session OT Goal Formulation: With patient Time For Goal Achievement: 02/25/19 Potential to Achieve Goals: Good  OT Frequency: Min 2X/week   Barriers to D/C:             Co-evaluation   Reason for Co-Treatment: For patient/therapist safety(based on previous session) PT goals addressed during session: Mobility/safety with mobility OT goals addressed during session: ADL's and self-care      AM-PAC OT "6 Clicks" Daily Activity     Outcome Measure Help from another person eating meals?: A Little Help from another person taking care of personal grooming?: A Little Help from another person toileting, which includes using toliet, bedpan, or urinal?: A Little Help from another person bathing (including washing, rinsing, drying)?: A Lot Help from another person to put on and taking off regular upper body clothing?: A Little Help from another person to put on and taking off regular lower body clothing?: A Lot 6 Click Score: 16   End of Session Equipment Utilized During Treatment: Gait belt;Rolling walker Nurse Communication: Mobility status  Activity Tolerance: Patient tolerated treatment well Patient left: in chair;with call bell/phone within reach;with chair alarm set;with nursing/sitter in room  OT Visit Diagnosis: Unsteadiness on feet (R26.81);Other abnormalities of gait and mobility (R26.89);Muscle weakness (generalized) (M62.81);Adult, failure to thrive (R62.7);Pain Pain - part of body: (sacral area)                Time: 3291-9166 OT Time Calculation (min): 18 min Charges:  OT General Charges $OT Visit: 1 Visit OT Evaluation $OT Eval Moderate Complexity: 1 Mod  Zenovia Jarred, MSOT, OTR/L Behavioral Health OT/ Acute Relief OT [facility] Office: [place] 02/11/2019, 1:32 PM

## 2019-02-11 NOTE — Progress Notes (Signed)
[name]   HEMATOLOGY/ONCOLOGY INPATIENT PROGRESS NOTE  Date of Service: 02/10/2019  Inpatient Attending: .Georgette Shell, MD   SUBJECTIVE  Patient seen this afternoon. More awake and talkative. Not no acute concerns. Feels some improvement in appetite. Now agreeable to SNF. Was encouraged to be out of bed and start working with therapies.  OBJECTIVE:  Appears fatigued and with temporal wasting.  PHYSICAL EXAMINATION  .BP 111/71 (BP Location: Left Arm)   Pulse 79   Temp 98.4 F (36.9 C) (Oral)   Resp 16   Ht 5' 9"  (1.753 m)   Wt 152 lb 1.9 oz (69 kg)   SpO2 97%   BMI 22.46 kg/m  . GENERAL:alert, in no acute distress SKIN: no acute rashes, no significant lesions EYES: conjunctiva are pink and non-injected, sclera anicteric OROPHARYNX: MMM NECK: supple, no JVD LYMPH:  no palpable lymphadenopathy in the cervical, axillary or inguinal regions LUNGS: clear to auscultation b/l with normal respiratory effort HEART: regular rate & rhythm ABDOMEN:  Ileostomy in situ, no tenderness to palpation. Extremity: no pedal edema PSYCH: alert & oriented x 3 with fluent speech NEURO: no focal motor/sensory deficits      MEDICAL HISTORY:  Past Medical History:  Diagnosis Date  . ALLERGIC RHINITIS   . Cancer ([place])    Lymphoma   . Diabetes mellitus   . Hyperlipidemia     SURGICAL HISTORY: Past Surgical History:  Procedure Laterality Date  . BIOPSY  03/15/2018   Procedure: BIOPSY;  Surgeon: Milus Banister, MD;  Location[name];  Service: Endoscopy;;  . BOWEL RESECTION N/A 08/25/2018   Procedure: LAPROSCOPIC LOOP ILEOSTOMY;  Surgeon: Greer Pickerel, MD;  Location: [facility];  Service: General;  Laterality: N/A;  . CLEFT PALATE REPAIR    . COLONOSCOPY N/A 03/15/2018   Procedure: COLONOSCOPY;  Surgeon: Milus Banister, MD;  Location: [facility];  Service: Endoscopy;  Laterality: N/A;  . COLONOSCOPY N/A 08/20/2018   Procedure: COLONOSCOPY;  Surgeon: Irene Shipper, MD;   Location: [facility];  Service: Endoscopy;  Laterality: N/A;  . deviated septum repair     slight improvement  . ESOPHAGOGASTRODUODENOSCOPY N/A 03/15/2018   Procedure: ESOPHAGOGASTRODUODENOSCOPY (EGD);  Surgeon: Milus Banister, MD;  Location: [name];  Service: Endoscopy;  Laterality: N/A;  . IR IMAGING GUIDED PORT INSERTION  03/17/2018  . LAPAROSCOPY N/A 08/25/2018   Procedure: LAPAROSCOPY DIAGNOSTIC;  Surgeon: Greer Pickerel, MD;  Location: [facility];  Service: General;  Laterality: N/A;  . TONSILLECTOMY      SOCIAL HISTORY: Social History   Socioeconomic History  . Marital status: Married    Spouse name: lisa  . Number of children: 0  . Years of education: Not on file  . Highest education level: Not on file  Occupational History  . Not on file  Social Needs  . Financial resource strain: Not hard at all  . Food insecurity:    Worry: Never true    Inability: Never true  . Transportation needs:    Medical: No    Non-medical: No  Tobacco Use  . Smoking status: Never Smoker  . Smokeless tobacco: Never Used  Substance and Sexual Activity  . Alcohol use: Yes    Comment: occasional  . Drug use: No  . Sexual activity: Yes  Lifestyle  . Physical activity:    Days per week: 0 days    Minutes per session: 0 min  . Stress: Not at all  Relationships  . Social connections:  Talks on phone: More than three times a week    Gets together: More than three times a week    Attends religious service: 1 to 4 times per year    Active member of club or organization: No    Attends meetings of clubs or organizations: Never    Relationship status: Married  . Intimate partner violence:    Fear of current or ex partner: No    Emotionally abused: No    Physically abused: No    Forced sexual activity: No  Other Topics Concern  . Not on file  Social History Narrative   Married 1985. No kids. 4 small dogs.       Works in Financial trader, residential      Hobbies: work on  cars, Haematologist, exercise as able    FAMILY HISTORY: Family History  Problem Relation Age of Onset  . Lung cancer Mother        smoker  . Brain cancer Mother        metastasis  . AAA (abdominal aortic aneurysm) Father        smoker    ALLERGIES:  is allergic to ciprofloxacin.  MEDICATIONS:  Scheduled Meds: . apixaban  5 mg Oral BID  . citalopram  10 mg Oral Daily  . dexamethasone  2 mg Oral Q breakfast  . feeding supplement  1 Container Oral BID BM  . feeding supplement (ENSURE ENLIVE)  237 mL Oral BID BM  . insulin aspart  0-15 Units Subcutaneous Q4H  . insulin glargine  5 Units Subcutaneous QHS  . oxandrolone  2.5 mg Oral BID  . zinc sulfate  220 mg Oral BID   Continuous Infusions: . sodium chloride 10 mL/hr at 02/10/19 1752  . sodium chloride    . TPN ADULT (ION) 70 mL/hr at 02/10/19 1734   PRN Meds:.acetaminophen **OR** acetaminophen, heparin lock flush, heparin lock flush, HYDROcodone-acetaminophen, ondansetron **OR** ondansetron (ZOFRAN) IV, sodium chloride flush, sodium chloride flush, sodium chloride flush  REVIEW OF SYSTEMS:    10 Point review of Systems was done is negative except as noted above.   LABORATORY DATA:  I have reviewed the data as listed  . CBC Latest Ref Rng & Units 02/10/2019 02/09/2019 02/08/2019  WBC 4.0 - 10.5 K/uL 3.9(L) 4.9 -  Hemoglobin 13.0 - 17.0 g/dL 8.8(L) 8.8(L) 7.9(L)  Hematocrit 39.0 - 52.0 % 27.4(L) 28.0(L) 24.5(L)  Platelets 150 - 400 K/uL 100(L) 108(L) -   . CMP Latest Ref Rng & Units 02/10/2019 02/09/2019 02/08/2019  Glucose 70 - 99 mg/dL 171(H) 186(H) 191(H)  BUN 8 - 23 mg/dL 12 12 11   Creatinine 0.61 - 1.24 mg/dL 0.47(L) 0.47(L) 0.53(L)  Sodium 135 - 145 mmol/L 128(L) 128(L) 125(L)  Potassium 3.5 - 5.1 mmol/L 3.3(L) 3.7 3.7  Chloride 98 - 111 mmol/L 96(L) 98 95(L)  CO2 22 - 32 mmol/L 22 20(L) 22  Calcium 8.9 - 10.3 mg/dL 8.1(L) 8.3(L) 8.1(L)  Total Protein 6.5 - 8.1 g/dL 5.2(L) 5.2(L) -  Total Bilirubin 0.3 - 1.2 mg/dL  0.5 0.8 -  Alkaline Phos 38 - 126 U/L 96 114 -  AST 15 - 41 U/L 11(L) 13(L) -  ALT 0 - 44 U/L 24 28 -   RADIOGRAPHIC STUDIES: I have personally reviewed the radiological images as listed and agreed with the findings in the report. No results found.  ASSESSMENT & PLAN:   1. 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 Continue on Eliquis 56m po BID  3. H/o Small bowel obstruction/ileus with sigmoid thickening/stenosis . 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 EGreer Pickerel 4. 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 GI panel and C. difficile negative.  CMV IgM negative, EBV IgM negative. 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 Sed rate and CRP still elevated  5.Hypovolemic hyponatremia. Other electrolyte issues including hypokalemia and hypomagnesemia  6. Pancytopenia -  Anemia likely multifactorial but could be from GI bleeding plus significant inflammation from severe radiation colitis. Leukopenia/lymphopenia likely from radiation toxicity.-resolved Thrombocytopenia from consumption related to GI bleeding and from radiation toxicity.- nearly normalized. B12 within normal limits Copper levels within normal limits at 132 Zinc deficiency noted- 44 Viral work-up not revealing Low likelihood of Burkitt's lymphoma progression at this time.  7. Admitted with failure to thrive, dehydration electrolyte abnormalities and hyperglycemia with poor p.o. intake. 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  8. Depression - discussed with patient. Continue Celexa and optimize dose to 228min 1-2 weeks.  9. Symptomatic anemia -transfuse prn to maintain hgb>8 given symptoms and some GI losses.  10. Moderate-severe protein calorie malnutrition and muscle waste -optimize nutrition -will check  testosterone levels - add Oxandrolone 2.6m100mo BID and will increase dose as needed -increase eliquis back to 6mg26m BID in the setting of improved plt counts no overt bleeding and some increased risk of VTE with decreased ambulation and adding oxandrolone. Plan -Appreciate excellent hospitalist cares. -Potassium and magnesium replacement - per hospitalist -continue Zinc replacement - continueTPN - monitor for refeeding syndrome. -more talkative with me today for goals of care and finally said " let do it the doctors way". -optimize DM2 management- might need addition of basal insulin. -will check testosterone levels - add Oxandrolone 2.6mg 49mBID and will increase dose as needed -continue Eliquis 6mg p28mID in the setting of improved plt counts no overt bleeding and some increased risk of VTE with decreased ambulation and adding oxandrolone. -may need to consider ESA for anemia or chronic disease -transfuse to hgb of 8 given symptoms. -Will likely need SNF on discharge -patient open to this idea today.    GautamSullivan Lone AAHCenterMS SCH CTHighline Medical CentereScl [facility] Westminsterology/Oncology Physician [facility] Centerice):       [phone] cell):  [phone]:           [phone]

## 2019-02-11 NOTE — Progress Notes (Signed)
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 Usual Weight: 190 lb - 15% weight loss in last month per RD  Insulin Requirements: 27 units given last 24hr, 5 units Lantus also given but have d/c'd that since started insulin in TPN 10 units and will continue to adjust this in TPN rather than use Lantus  Current Nutrition: regular but not eating per charting  [facility] access: [place] TPN start date: 5/29  ASSESSMENT                                                                                                          HPI: 63 yo male with burkitt's lymphoma s/p chemo now with severe radiation colitis, prolonged malnutrition, multiple recent admissions to start TPN per pharmacy management  Significant events:   Today, 02/11/19  Glucose - goal < 150 while on TPN - 170, 267, 208, 189, 183. note hx DM and chronic dexamethasone use  Electrolytes -WNL except Na 131 - will adjust in TPN  Renal - SCr 0.47  LFTs - AST/ALT stable  TGs - 100  Prealbumin - 6.5  NUTRITIONAL GOALS                                                                                             RD recs: 2300-2500 kcal/day, 115-125g protein/day  Custom TPN at goal of 95 ml/hr to provide: 125 g/day protein, 2270Kcal/day.  PLAN                                                                                                                        At 1800 today:  Continue custom TPN rate of 70 ml/hr until CBGs better controlled  Plan to advance as tolerated to the goal rate.  TPN to contain standard multivitamins daily and trace elements only on MWF due to national  shortage.  Standard TPN concentrations of lytes: except increase Na to 150 mEq/L, continue increased K at 65, UA:UEBV 1:1  Reduce IVF to [facility]  Continue SSI/CBGs q4h moderate scale. Increase insulin in TPN from 10 units to 20 units. Note we are adjusting insulin in TPN for elevated CBGs and not using Lantus at this time  TPN lab panels on Mondays & Thursdays.   Adrian Saran, PharmD, BCPS 02/11/2019 9:07 AM

## 2019-02-11 NOTE — Progress Notes (Signed)
PROGRESS NOTE  Mark Frey WIO:[id number] DOB: November 13, 1955 DOA: 02/07/2019 PCP: [name], MD   LOS: 3 days   Brief Narrative / Interim history: 63 y.o.malewith medical history significant ofBurkitt's lymphoma status post radiation and chemotherapy,s/p ileostomy, diabetes mellitus, type 2, radiation colitis, PE/DVT on Eliquis.History provided mostly by chart review and wife discussion as patient was not too interested in providing history. Patient presents with three weeks of worsening weakness. At baseline, he is able to ambulate independently with a walker but more recently was unable to stand up on his own. The patient and his wife went to his PCP on 5/26 to discuss decline and to assess capacity. Patient, at that time was considering hospice referral, but has now opted for acute medical treatment.  Subjective: -doing well this morning, no complaints other than he is hungry. Denies chest pain, dyspnea, denies abdominal pain, nausea/vomiting.   Assessment & Plan: Active Problems:   Diabetes mellitus ([place])   Burkitt lymphoma, lymph nodes of multiple sites ([place])   Hyponatremia   Malnutrition of moderate degree   Dehydration   Palliative care by specialist   Goals of care, counseling/discussion   Symptomatic anemia   Atrophy of muscle of multiple sites   Depression   Principal Problem FTT / dehydration, severe malnutrition -due to poor po intake as well as high ostomy output. He was started on TPN on 02/09/2019 de to poor nutritional status and expected bowel dysfunction for weeks to months. Monitor for refeeding syndrome. Encourage po intake -SNF placement pending, have to be able to accommodate TPN  Active Problems Severe pan colitis -likely due to radiation toxicity, GI panel and C diff negative. CRP and sed rate elevated  Multiple electrolyte abnormalities secondary to dehydration decreased p.o. intake and increased ostomy output including hyponatremia  hypokalemia and hypomagnesemia which is being replaced  Type 2 diabetes  -Continue sliding scale, CBGs controlled  History ofright lower extremityDVT andbilateralPE  -continue Eliquis  Pancytopenia -Likely in the setting of malignancy  Depression  -started on Celexa.  Scheduled Meds: . apixaban  5 mg Oral BID  . citalopram  10 mg Oral Daily  . dexamethasone  2 mg Oral Q breakfast  . feeding supplement  1 Container Oral BID BM  . feeding supplement (ENSURE ENLIVE)  237 mL Oral BID BM  . insulin aspart  0-15 Units Subcutaneous Q4H  . oxandrolone  2.5 mg Oral BID  . zinc sulfate  220 mg Oral BID   Continuous Infusions: . sodium chloride 10 mL/hr at 02/10/19 1752  . sodium chloride    . TPN ADULT (ION) 70 mL/hr at 02/10/19 1734  . TPN ADULT (ION)     PRN Meds:.acetaminophen **OR** acetaminophen, heparin lock flush, heparin lock flush, HYDROcodone-acetaminophen, ondansetron **OR** ondansetron (ZOFRAN) IV, sodium chloride flush, sodium chloride flush, sodium chloride flush  DVT prophylaxis: Eliquis Code Status: DNR Family Communication: discussed with patient  Disposition Plan: SNF when ready, SNF able to use TPN  Consultants:   Palliative medicine  Oncology   Procedures:   None   Antimicrobials:  None    Objective: Vitals:   02/10/19 0543 02/10/19 1254 02/10/19 2009 02/11/19 0430  BP: 115/65 103/71 111/71 105/75  Pulse: 91 100 79 82  Resp: 18 18 16 16   Temp: 98.5 F (36.9 C) 98.6 F (37 C) 98.4 F (36.9 C) 97.8 F (36.6 C)  TempSrc: Oral Oral Oral Axillary  SpO2: 98% 99% 97% 96%  Weight:      Height:  Intake/Output Summary (Last 24 hours) at 02/11/2019 1421 Last data filed at 02/11/2019 1134 Gross per 24 hour  Intake 2916.04 ml  Output 1775 ml  Net 1141.04 ml   Filed Weights   02/08/19 1726  Weight: 69 kg    Examination:  Constitutional: NAD Eyes: PERRL, lids and conjunctivae normal ENMT: Mucous membranes are moist. No  oropharyngeal exudates Neck: normal, supple Respiratory: clear to auscultation bilaterally, no wheezing, no crackles. Normal respiratory effort.  Cardiovascular: Regular rate and rhythm, no murmurs / rubs / gallops. No LE edema. 2+ pedal pulses.  Abdomen: no tenderness. Bowel sounds positive. Ostomy bag in place Musculoskeletal: no clubbing / cyanosis.  Skin: no rashes Neurologic: no focal deficits Psychiatric: Normal judgment and insight. Alert and oriented x 3.   Data Reviewed: I have independently reviewed following labs and imaging studies   CBC: Recent Labs  Lab 02/07/19 1014 02/08/19 0553 02/08/19 1635 02/09/19 0412 02/10/19 0359 02/11/19 0336  WBC 6.3 5.6  --  4.9 3.9* 3.4*  NEUTROABS 5.4  --   --  4.2 3.3  --   HGB 8.5* 7.0* 7.9* 8.8* 8.8* 8.0*  HCT 25.9* 22.4* 24.5*  24.0* 28.0* 27.4* 26.0*  MCV 94.9 97.4  --  94.0 93.2 95.9  PLT 128* 131*  --  108* 100* 93*   Basic Metabolic Panel: Recent Labs  Lab 02/07/19 1014  02/08/19 1140 02/08/19 1635 02/09/19 0412 02/10/19 0359 02/11/19 0336  NA 125*   < > 127* 125* 128* 128* 131*  K 3.0*   < > 3.5 3.7 3.7 3.3* 4.1  CL 90*   < > 97* 95* 98 96* 100  CO2 19*   < > 19* 22 20* 22 24  GLUCOSE 329*   < > 183* 191* 186* 171* 205*  BUN 19   < > 13 11 12 12 14   CREATININE 0.67   < > 0.71 0.53* 0.47* 0.47* 0.37*  CALCIUM 8.7*   < > 8.3* 8.1* 8.3* 8.1* 8.2*  MG 1.9  --   --   --  2.1 1.8 2.0  PHOS  --   --   --   --  2.9 2.7 3.1   < > = values in this interval not displayed.   GFR: Estimated Creatinine Clearance: 93.4 mL/min (A) (by C-G formula based on SCr of 0.37 mg/dL (L)). Liver Function Tests: Recent Labs  Lab 02/07/19 1014 02/09/19 0412 02/10/19 0359 02/11/19 0336  AST 15 13* 11* 11*  ALT 30 28 24 24   ALKPHOS 141* 114 96 89  BILITOT 0.8 0.8 0.5 0.3  PROT 5.9* 5.2* 5.2* 4.9*  ALBUMIN 2.5* 2.1* 2.0* 1.9*   No results for input(s): LIPASE, AMYLASE in the last 168 hours. No results for input(s): AMMONIA in  the last 168 hours. Coagulation Profile: No results for input(s): INR, PROTIME in the last 168 hours. Cardiac Enzymes: No results for input(s): CKTOTAL, CKMB, CKMBINDEX, TROPONINI in the last 168 hours. BNP (last 3 results) No results for input(s): PROBNP in the last 8760 hours. HbA1C: No results for input(s): HGBA1C in the last 72 hours. CBG: Recent Labs  Lab 02/10/19 2005 02/11/19 0016 02/11/19 0426 02/11/19 0717 02/11/19 1132  GLUCAP 267* 208* 189* 183* 179*   Lipid Profile: Recent Labs    02/09/19 0412  TRIG 100   Thyroid Function Tests: Recent Labs    02/09/19 0412  TSH 0.417   Anemia Panel: Recent Labs    02/08/19 1635 02/09/19 0412  VITAMINB12 1,393*  --  FERRITIN 2,935*  --   RETICCTPCT  --  2.9   Urine analysis:    Component Value Date/Time   COLORURINE YELLOW 02/07/2019 [place] 02/07/2019 1652   LABSPEC 1.024 02/07/2019 1652   PHURINE 6.0 02/07/2019 1652   GLUCOSEU 50 (A) 02/07/2019 1652   HGBUR NEGATIVE 02/07/2019 1652   HGBUR negative 01/20/2009 0840   BILIRUBINUR NEGATIVE 02/07/2019 1652   BILIRUBINUR [place] 01/30/2018 [place] 02/07/2019 1652   PROTEINUR 30 (A) 02/07/2019 1652   UROBILINOGEN 2.0 (A) 01/30/2018 1652   UROBILINOGEN 0.2 01/20/2009 0840   NITRITE NEGATIVE 02/07/2019 1652   LEUKOCYTESUR NEGATIVE 02/07/2019 1652   Sepsis Labs: Invalid input(s): PROCALCITONIN, LACTICIDVEN  Recent Results (from the past 240 hour(s))  Blood culture (routine x 2)     Status: None (Preliminary result)   Collection Time: 02/07/19 10:14 AM  Result Value Ref Range Status   Specimen Description   Final    BLOOD LEFT ANTECUBITAL Performed at [facility] [address], [place], [place] [address]    Special Requests   Final    BOTTLES DRAWN AEROBIC AND ANAEROBIC Blood Culture adequate volume Performed at [facility] [address] [place], [place] [address]    Culture    Final    NO GROWTH 3 DAYS Performed at [facility], [place] [address], [place], [place] [address]    Report Status PENDING  Incomplete  SARS Coronavirus 2 (CEPHEID - Performed in [facility] [place] hospital lab), Hosp Order     Status: None   Collection Time: 02/07/19 10:14 AM  Result Value Ref Range Status   SARS Coronavirus 2 NEGATIVE NEGATIVE Final    Comment: (NOTE) If result is NEGATIVE SARS-CoV-2 target nucleic acids are NOT DETECTED. The SARS-CoV-2 RNA is generally detectable in upper and lower  respiratory specimens during the acute phase of infection. The lowest  concentration of SARS-CoV-2 viral copies this assay can detect is 250  copies / mL. A negative result does not preclude SARS-CoV-2 infection  and should not be used as the sole basis for treatment or other  patient management decisions.  A negative result may occur with  improper specimen collection / handling, submission of specimen other  than nasopharyngeal swab, presence of viral mutation(s) within the  areas targeted by this assay, and inadequate number of viral copies  (<250 copies / mL). A negative result must be combined with clinical  observations, patient history, and epidemiological information. If result is POSITIVE SARS-CoV-2 target nucleic acids are DETECTED. The SARS-CoV-2 RNA is generally detectable in upper and lower  respiratory specimens dur ing the acute phase of infection.  Positive  results are indicative of active infection with SARS-CoV-2.  Clinical  correlation with patient history and other diagnostic information is  necessary to determine patient infection status.  Positive results do  not rule out bacterial infection or co-infection with other viruses. If result is PRESUMPTIVE POSTIVE SARS-CoV-2 nucleic acids MAY BE PRESENT.   A presumptive positive result was obtained on the submitted specimen  and confirmed on repeat testing.  While 2019 novel coronavirus  (SARS-CoV-2) nucleic acids may  be present in the submitted sample  additional confirmatory testing may be necessary for epidemiological  and / or clinical management purposes  to differentiate between  SARS-CoV-2 and other Sarbecovirus currently known to infect humans.  If clinically indicated additional testing with an alternate test  methodology [phone]) is advised. The SARS-CoV-2 RNA  is generally  detectable in upper and lower respiratory sp ecimens during the acute  phase of infection. The expected result is Negative. Fact Sheet for Patients:  StrictlyIdeas.no Fact Sheet for Healthcare Providers: BankingDealers.co.za This test is not yet approved or cleared by the [place] FDA and has been authorized for detection and/or diagnosis of SARS-CoV-2 by FDA under an Emergency Use Authorization (EUA).  This EUA will remain in effect (meaning this test can be used) for the duration of the COVID-19 declaration under Section 564(b)(1) of the Act, 21 U.S.C. section 360bbb-3(b)(1), unless the authorization is terminated or revoked sooner. Performed at [facility], [place] [address], [place], [place] [address]   Blood culture (routine x 2)     Status: None (Preliminary result)   Collection Time: 02/07/19 11:03 AM  Result Value Ref Range Status   Specimen Description   Final    BLOOD LEFT HAND Performed at [facility] [address] [place], [place] [address]    Special Requests   Final    BOTTLES DRAWN AEROBIC AND ANAEROBIC Blood Culture adequate volume Performed at [facility] [address], [place], [place] [address]    Culture   Final    NO GROWTH 3 DAYS Performed at [facility], [place] [address], [place], [place] [address]    Report Status PENDING  Incomplete  Urine culture     Status: None   Collection Time: 02/07/19  4:52 PM  Result Value Ref Range Status   Specimen Description   Final     URINE, RANDOM Performed at [facility] [address], [place], [place] [address]    Special Requests   Final    NONE Performed at [facility], [place] [address], [place], [place] [address]    Culture   Final    NO GROWTH Performed at [facility], [place] [address] [place],  [address]    Report Status 02/08/2019 FINAL  Final      Radiology Studies: No results found.   Marzetta Board, MD, PhD [facility]  Contact via  [email or web address]  [place] P: [phone]  F: [phone]

## 2019-02-11 NOTE — Plan of Care (Signed)
  Problem: Pain Managment: Goal: General experience of comfort will improve Outcome: Progressing   Problem: Skin Integrity: Goal: Risk for impaired skin integrity will decrease Outcome: Progressing Stage II sores to sacrum still present, bleeding slightly when cleansed.  Foam dressing changed, encouraged patient to turn and shift weight to prevent further breakdown    Problem: Education: Goal: Knowledge of General Education information will improve Description Including pain rating scale, medication(s)/side effects and non-pharmacologic comfort measures Outcome: Completed/Met   Problem: Health Behavior/Discharge Planning: Goal: Ability to manage health-related needs will improve Outcome: Completed/Met   Problem: Coping: Goal: Level of anxiety will decrease Outcome: Completed/Met   Problem: Elimination: Goal: Will not experience complications related to bowel motility Outcome: Completed/Met Goal: Will not experience complications related to urinary retention Outcome: Completed/Met

## 2019-02-11 NOTE — Progress Notes (Signed)
Physical Therapy Treatment Patient Details Name: Mark Frey MRN: [id number] DOB: 09-Aug-1956 Today's Date: 02/11/2019    History of Present Illness 63 y.o. 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   Follow Up Recommendations  SNF;Supervision/Assistance - 24 hour     Equipment Recommendations       Recommendations for Other Services       Precautions / Restrictions Precautions Precautions: Fall Precaution Comments: ileostomy Restrictions Weight Bearing Restrictions: No    Mobility  Bed Mobility Overal bed mobility: Needs Assistance Bed Mobility: Supine to Sit;Rolling Rolling: Min assist   Supine to sit: Min assist        Transfers Overall transfer level: Needs assistance Equipment used: Rolling walker (2 wheeled) Transfers: Sit to/from Stand Sit to Stand: Min assist;+2 safety/equipment         General transfer comment: cues for use of UEs to self assist.  Min assist to bring wt up and fwd and to balance in initial standing  Ambulation/Gait Ambulation/Gait assistance: Min assist;+2 safety/equipment Gait Distance (Feet): 130 Feet Assistive device: Rolling walker (2 wheeled) Gait Pattern/deviations: Step-through pattern;Decreased step length - right;Decreased step length - left;Shuffle;Trunk flexed     General Gait Details: cues for posture and position from RW.  Pt unsteady but with no LOB using RW   Stairs             Wheelchair Mobility    Modified Rankin (Stroke Patients Only)       Balance Overall balance assessment: Needs assistance Sitting-balance support: No upper extremity supported;Feet supported Sitting balance-Leahy  Scale: Good     Standing balance support: Bilateral upper extremity supported Standing balance-Leahy Scale: Poor                              Cognition Arousal/Alertness: Awake/alert Behavior During Therapy: Flat affect Overall Cognitive Status: Within Functional Limits for tasks assessed                                        Exercises      General Comments        Pertinent Vitals/Pain Pain Assessment: No/denies pain    Home Living                      Prior Function            PT Goals (current goals can now be found in the care plan section) Acute Rehab PT Goals PT Goal Formulation: With patient Time For Goal Achievement: 02/15/19 Potential to Achieve Goals: Good Progress towards PT goals: Progressing toward goals    Frequency    Min 2X/week      PT Plan Current plan remains appropriate    Co-evaluation PT/OT/SLP Co-Evaluation/Treatment: Yes Reason for Co-Treatment: For patient/therapist safety(based on previous PT session.) PT goals addressed during session: Mobility/safety with mobility OT goals addressed during session: ADL's and self-care      AM-PAC PT "6 Clicks" Mobility   Outcome Measure  Help needed turning from your back to your side while in  a flat bed without using bedrails?: A Little Help needed moving from lying on your back to sitting on the side of a flat bed without using bedrails?: A Little Help needed moving to and from a bed to a chair (including a wheelchair)?: A Little Help needed standing up from a chair using your arms (e.g., wheelchair or bedside chair)?: A Little Help needed to walk in hospital room?: A Little Help needed climbing 3-5 steps with a railing? : A Lot 6 Click Score: 17    End of Session   Activity Tolerance: Patient tolerated treatment well Patient left: in chair;with call bell/phone within reach;with nursing/sitter in room Nurse Communication: Mobility status PT Visit  Diagnosis: Muscle weakness (generalized) (M62.81)     Time: 3267-1245 PT Time Calculation (min) (ACUTE ONLY): 23 min  Charges:  $Gait Training: 8-22 mins                     [place] Pager [phone] Office [phone]    Delynda Sepulveda 02/11/2019, 1:00 PM

## 2019-02-12 LAB — DIFFERENTIAL
Abs Immature Granulocytes: 0.05 10*3/uL (ref 0.00–0.07)
Basophils Absolute: 0.1 10*3/uL (ref 0.0–0.1)
Basophils Relative: 2 %
Eosinophils Absolute: 0 10*3/uL (ref 0.0–0.5)
Eosinophils Relative: 0 %
Immature Granulocytes: 2 %
Lymphocytes Relative: 4 %
Lymphs Abs: 0.1 10*3/uL — ABNORMAL LOW (ref 0.7–4.0)
Monocytes Absolute: 0.2 10*3/uL (ref 0.1–1.0)
Monocytes Relative: 8 %
Neutro Abs: 2.7 10*3/uL (ref 1.7–7.7)
Neutrophils Relative %: 84 %

## 2019-02-12 LAB — CBC
HCT: 26.1 % — ABNORMAL LOW (ref 39.0–52.0)
Hemoglobin: 8.2 g/dL — ABNORMAL LOW (ref 13.0–17.0)
MCH: 30.4 pg (ref 26.0–34.0)
MCHC: 31.4 g/dL (ref 30.0–36.0)
MCV: 96.7 fL (ref 80.0–100.0)
Platelets: 89 10*3/uL — ABNORMAL LOW (ref 150–400)
RBC: 2.7 MIL/uL — ABNORMAL LOW (ref 4.22–5.81)
RDW: 19.5 % — ABNORMAL HIGH (ref 11.5–15.5)
WBC: 3.2 10*3/uL — ABNORMAL LOW (ref 4.0–10.5)
nRBC: 0 % (ref 0.0–0.2)

## 2019-02-12 LAB — COMPREHENSIVE METABOLIC PANEL
ALT: 36 U/L (ref 0–44)
AST: 14 U/L — ABNORMAL LOW (ref 15–41)
Albumin: 1.8 g/dL — ABNORMAL LOW (ref 3.5–5.0)
Alkaline Phosphatase: 107 U/L (ref 38–126)
Anion gap: 7 (ref 5–15)
BUN: 15 mg/dL (ref 8–23)
CO2: 26 mmol/L (ref 22–32)
Calcium: 8.2 mg/dL — ABNORMAL LOW (ref 8.9–10.3)
Chloride: 100 mmol/L (ref 98–111)
Creatinine, Ser: 0.31 mg/dL — ABNORMAL LOW (ref 0.61–1.24)
GFR calc Af Amer: >60 mL/min (ref >60)
GFR calc non Af Amer: >60 mL/min (ref >60)
Glucose, Bld: 129 mg/dL — ABNORMAL HIGH (ref 70–99)
Potassium: 4 mmol/L (ref 3.5–5.1)
Sodium: 133 mmol/L — ABNORMAL LOW (ref 135–145)
Total Bilirubin: 0.4 mg/dL (ref 0.3–1.2)
Total Protein: 4.9 g/dL — ABNORMAL LOW (ref 6.5–8.1)

## 2019-02-12 LAB — PHOSPHORUS: Phosphorus: 3 mg/dL (ref 2.5–4.6)

## 2019-02-12 LAB — CULTURE, BLOOD (ROUTINE X 2)
Culture: NO GROWTH
Culture: NO GROWTH
Special Requests: ADEQUATE
Special Requests: ADEQUATE

## 2019-02-12 LAB — TESTOSTERONE: Testosterone: 19 ng/dL — ABNORMAL LOW (ref 264–916)

## 2019-02-12 LAB — TRIGLYCERIDES: Triglycerides: 71 mg/dL (ref <150)

## 2019-02-12 LAB — GLUCOSE, CAPILLARY
Glucose-Capillary: 119 mg/dL — ABNORMAL HIGH (ref 70–99)
Glucose-Capillary: 123 mg/dL — ABNORMAL HIGH (ref 70–99)
Glucose-Capillary: 172 mg/dL — ABNORMAL HIGH (ref 70–99)
Glucose-Capillary: 202 mg/dL — ABNORMAL HIGH (ref 70–99)

## 2019-02-12 LAB — MAGNESIUM: Magnesium: 1.6 mg/dL — ABNORMAL LOW (ref 1.7–2.4)

## 2019-02-12 LAB — PREALBUMIN: Prealbumin: 7.7 mg/dL — ABNORMAL LOW (ref 18–38)

## 2019-02-12 MED ORDER — DEXAMETHASONE 0.5 MG PO TABS
1.0000 mg | ORAL_TABLET | Freq: Every day | ORAL | Status: DC
  Administered 2019-02-13 – 2019-03-06 (×20): 1 mg via ORAL
  Filled 2019-02-12 (×23): qty 2

## 2019-02-12 MED ORDER — MAGNESIUM SULFATE 4 GM/100ML IV SOLN
4.0000 g | Freq: Once | INTRAVENOUS | Status: AC
  Administered 2019-02-12: 4 g via INTRAVENOUS
  Filled 2019-02-12: qty 100

## 2019-02-12 MED ORDER — OXANDROLONE 2.5 MG PO TABS
5.0000 mg | ORAL_TABLET | Freq: Two times a day (BID) | ORAL | Status: DC
  Administered 2019-02-12 – 2019-03-06 (×38): 5 mg via ORAL
  Filled 2019-02-12 (×42): qty 2

## 2019-02-12 MED ORDER — TRAVASOL 10 % IV SOLN
INTRAVENOUS | Status: AC
  Administered 2019-02-12: 17:00:00 via INTRAVENOUS
  Filled 2019-02-12: qty 1254

## 2019-02-12 NOTE — Progress Notes (Signed)
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 Usual Weight: 190 lb - 15% weight loss in last month per RD  Insulin Requirements: 19 units given last 24hr, 20 units in TPN  Current Nutrition: regular diet with Boost BID and Ensure BID ( refusing most supplements),  TPN  IVF: NS at [facility] access: Port TPN start date: 5/29  ASSESSMENT                                                                                                          HPI: 63 yo male with burkitt's lymphoma s/p chemo now with severe radiation colitis, prolonged malnutrition, multiple recent admissions to start TPN per pharmacy management  Significant events:   Today, 02/12/19  Glucose - goal < 150 while on TPN -CBGs 223,129 and 123 after TPN w/ 20 units hung. note hx DM and chronic dexamethasone 2 mg qday  Electrolytes - Na 133, mag 1.6  Renal - SCr 0.47  LFTs - AST/ALT stable  TGs - 71 (6/1)  Prealbumin - 7.7 (6/1), oxandrin added 5/29  NUTRITIONAL GOALS                                                                                             RD recs: 2300-2500 kcal/day, 115-125g protein/day  Custom TPN at goal of 95 ml/hr to provide: 125 g/day protein, 2270Kcal/day.  PLAN                                                                                                                    Now: mag 4 gm bolus  At 1800 today:  increase custom TPN to goal rate of 95 ml/hr to provide full support  TPN to contain standard multivitamins daily and trace elements only on MWF due to [employer].  Standard TPN concentrations of lytes: except Na 150 mEq/L, K at 45, JZ:PHXT  1:1  Continue SSI/CBGs q4h moderate scale. Increase insulin in TPN from 20 units to 28 units to account for increased rate. Note we are adjusting insulin in TPN for elevated CBGs and not using Lantus at this time  TPN lab panels on Mondays & Thursdays. BMET, Mg, Phos in am  Consider transition to cyclic TPN if home TPN considered  Eudelia Bunch, Pharm.D 02/12/2019 9:50 AM

## 2019-02-12 NOTE — TOC Progression Note (Signed)
Transition of Care [facility]) - Progression Note    Patient Details  Name: Mark Frey MRN: [id number] Date of Birth: [birth year]  [facility] CM/SW Contact  Aretha Levi, Juliann Pulse, RN Phone Number: 02/12/2019, 1:18 PM  Clinical Narrative:Spoke to patient in rm, & spouse [name] on [facility] accepted patient-they will start auth with insurance-patient/spouse in agreement-TPN, ileostomy-nurse updated CM on [place] ostomy type,bags,device,cushion-[place] rep Lynelle Smoke is aware-we will send the patient to facility with a few while facility orders additional @ d/c.      Expected Discharge Plan: Skilled Nursing Facility Barriers to Discharge: Insurance Authorization  Expected Discharge Plan and Services Expected Discharge Plan: [place]   Discharge Planning Services: CM Consult Post Acute Care Choice: [place] Living arrangements for the past 2 months: Single Family Home Expected Discharge Date: (unknown)                         HH Arranged: RN, PT [place] Agency: [place] ([place]) Date [place]: 02/08/19 Time [place]: 1244 Representative spoke with at [place]: [place] ([place]) Interventions    Readmission Risk Interventions Readmission Risk Prevention Plan 02/08/2019  Transportation Screening Complete  Medication Review Press [employer]) Complete  PCP or Specialist appointment within 3-5 days of discharge Complete  HRI or [place] Complete  SW Recovery Care/Counseling Consult Complete  [place] Not Applicable  Some recent data might be hidden

## 2019-02-12 NOTE — Progress Notes (Signed)
Daily Progress Note   Patient Name: Mark Frey       Date: 02/12/2019 DOB: 09/02/1956  Age: 63 y.o. MRN#: [id number] Attending Physician: Roney Jaffe, MD Primary Care Physician: [name], MD Admit Date: 02/07/2019  Reason for Consultation/Follow-up: Establishing goals of care  Subjective:  patient is awake, sitting up in bed today, trying to eat lunch, TPN is ongoing, he has agreed to SNF.   Length of Stay: 4  Current Medications: Scheduled Meds:  . apixaban  5 mg Oral BID  . citalopram  10 mg Oral Daily  . dexamethasone  2 mg Oral Q breakfast  . feeding supplement  1 Container Oral BID BM  . feeding supplement (ENSURE ENLIVE)  237 mL Oral BID BM  . insulin aspart  0-15 Units Subcutaneous Q4H  . oxandrolone  2.5 mg Oral BID  . zinc sulfate  220 mg Oral BID    Continuous Infusions: . TPN ADULT (ION) 70 mL/hr at 02/12/19 0600  . TPN ADULT (ION)      PRN Meds: acetaminophen **OR** acetaminophen, heparin lock flush, heparin lock flush, HYDROcodone-acetaminophen, ondansetron **OR** ondansetron (ZOFRAN) IV, sodium chloride flush, sodium chloride flush  Physical Exam          awake Resting in bed Has ostomy No edema Non focal Regular S1 S 2  Vital Signs: BP 101/60 (BP Location: Right Arm)   Pulse 92   Temp 98.5 F (36.9 C) (Oral)   Resp 16   Ht 5' 9"  (1.753 m)   Wt 69 kg   SpO2 96%   BMI 22.46 kg/m  SpO2: SpO2: 96 % O2 Device: O2 Device: Room Air O2 Flow Rate:    Intake/output summary:   Intake/Output Summary (Last 24 hours) at 02/12/2019 1430 Last data filed at 02/12/2019 1239 Gross per 24 hour  Intake 1628 ml  Output 1925 ml  Net -297 ml   LBM: Last BM Date: 02/12/19 Baseline Weight: Weight: 69 kg Most recent weight: Weight: 69 kg        Palliative Assessment/Data:    Flowsheet Rows     Most Recent Value  Intake Tab  Referral Department  Hospitalist  Unit at Time of Referral  ER  Palliative Care Primary Diagnosis  Cancer  Date Notified  02/07/19  Palliative Care Type  New  Palliative care  Reason for referral  Clarify Goals of Care  Date of Admission  02/07/19  Date first seen by Palliative Care  02/08/19  # of days Palliative referral response time  1 Day(s)  # of days IP prior to Palliative referral  0  Clinical Assessment  Psychosocial & Spiritual Assessment  Palliative Care Outcomes      Patient Active Problem List   Diagnosis Date Noted  . Depression   . Symptomatic anemia   . Atrophy of muscle of multiple sites   . Palliative care by specialist   . Goals of care, counseling/discussion   . Dehydration 02/07/2019  . Hypomagnesemia 01/11/2019  . Neutropenia ([place]) 01/11/2019  . Other neutropenia ([place])   . Pressure injury of skin 01/07/2019  . Zinc deficiency   . Malnutrition of moderate degree 01/04/2019  . Radiation gastroenteritis   . Radiation colitis   . Hyponatremia 01/01/2019  . Pancytopenia ([place]) 01/01/2019  . Hematochezia   . Abnormal CT of the abdomen   . Severe protein-calorie malnutrition ([place]) 08/19/2018  . 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 Diverticulitis of colon   . Low magnesium level 07/30/2018  . Hypokalemia 07/21/2018  . Bowel obstruction in setting of lymphoma and sigmoid stricture 07/21/2018  . Diarrhea 07/21/2018  . Burkitt lymphoma, lymph nodes of multiple sites ([place]) 05/22/2018  . Diabetes mellitus ([place])   . Edema   . Deep vein thrombosis (DVT) of femoral vein of right lower extremity ([place])   . Counseling regarding advance care planning and goals of care 04/25/2018  . Gastrointestinal hemorrhage   . Encounter for antineoplastic chemotherapy   . 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[place]) 04/10/2018  . Port-A-Cath in place 03/27/2018  .  Acute deep vein thrombosis (DVT) of femoral vein of right lower extremity ([place])   . 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[place]) 03/15/2018  . Bilateral pulmonary embolism ([place]) 03/10/2018  . Anemia 03/10/2018  . Hypoglycemia 03/10/2018  . Occult blood in stools 03/10/2018  . Abdominal mass 03/10/2018  . Tachycardia 01/31/2017  . Controlled diabetes mellitus type II without complication ([place]) 92/42/6834  . Hypertension 06/05/2014  . Irritable bowel syndrome 03/30/2010  . Morbid obesity ([place]) 12/29/2009  . Dysmetabolic syndrome X 19/62/2297  . BACK PAIN WITH RADICULOPATHY 06/09/2007  . Hyperlipidemia 05/12/2007  . ALLERGIC RHINITIS 05/12/2007    Palliative Care Assessment & Plan   Patient Profile:    Assessment: High grade Burkitt's lymphoma, B cell lymphoma. Severe pan colitis, radiation toxicity Pancytopenia H/O DVT PE Failure to thrive Electrolyte abnormalities depression   Recommendations/Plan:  continue Celexa  Continue current mode of care  Agree with SNF rehab, he is now on TPN as well. Seems to be in better spirits, wishes to continue on with current mode of care. States he has spoken with his wife on the phone today.  No new PMT recommendations at this time.  Code Status:    Code Status Orders  (From admission, onward)         Start     Ordered   02/07/19 1355  Do not attempt resuscitation (DNR)  Continuous    Question Answer Comment  In the event of cardiac or respiratory ARREST Do not call a "code blue"   In the event of cardiac or respiratory ARREST Do not perform Intubation, CPR, defibrillation or ACLS   In the event of cardiac or respiratory ARREST Use medication by any route, position, wound care, and other measures  to relive pain and suffering. May use oxygen, suction and manual treatment of airway obstruction as needed for comfort.      02/07/19 1354        Code Status History    Date Active Date Inactive Code Status Order ID Comments User Context    02/07/2019 1336 02/07/2019 1354 DNR [id number]  Mariel Aloe, MD Inpatient   01/01/2019 1254 01/16/2019 2130 Full Code [id number]  Debbe Odea, MD ED   08/18/2018 0146 08/28/2018 1729 Full Code [id number]  [name], MD ED   07/30/2018 2133 08/04/2018 1735 Full Code [id number]  [name], MD Inpatient   07/21/2018 1458 07/24/2018 1551 Full Code [id number]  [name], MD ED   07/03/2018 1053 07/07/2018 1645 Full Code [id number]  [name], MD Inpatient   06/12/2018 1113 06/16/2018 1542 Full Code [id number]  [name], MD Inpatient   05/22/2018 1036 05/26/2018 1644 Full Code [id number]  [name], MD Inpatient   05/01/2018 0959 05/05/2018 2317 Full Code [id number]  [name], MD Inpatient   04/10/2018 1604 04/14/2018 1640 Full Code [id number]  [name], MD Inpatient   03/10/2018 2125 03/25/2018 2126 Full Code [id number]  [name], MD Inpatient       Prognosis:   Unable to determine  Discharge Planning:  To Be Determined  Care plan was discussed with  Patient.   Thank you for allowing the Palliative Medicine Team to assist in the care of this patient.   Time In: 1400 Time Out: 1425 Total Time 25 Prolonged Time Billed  no       Greater than 50%  of this time was spent counseling and coordinating care related to the above assessment and plan.  Loistine Chance, MD [phone] Please contact Palliative Medicine Team phone at [phone] for questions and concerns.

## 2019-02-12 NOTE — Progress Notes (Signed)
Patient continues to refuse turning and repositioning.  Explained that he has breakdown on his bottom and it would be beneficial to turn Q2H to promote healing but declines  And states "im fine how I am"

## 2019-02-12 NOTE — Progress Notes (Signed)
PROGRESS NOTE  Mark Frey JGG:[id number] DOB: 09/18/1955 DOA: 02/07/2019 PCP: [name], MD   LOS: 4 days   Brief Narrative / Interim history: 63 y.o.malewith medical history significant ofBurkitt's lymphoma status post radiation and chemotherapy,s/p ileostomy, diabetes mellitus, type 2, radiation colitis, PE/DVT on Eliquis.History provided mostly by chart review and wife discussion as patient was not too interested in providing history. Patient presents with three weeks of worsening weakness. At baseline, he is able to ambulate independently with a walker but more recently was unable to stand up on his own. The patient and his wife went to his PCP on 5/26 to discuss decline and to assess capacity. Patient, at that time was considering hospice referral, but has now opted for acute medical treatment.  Subjective: -doing well this morning, no complaints other than he is hungry. Denies chest pain, dyspnea, denies abdominal pain, nausea/vomiting.   Assessment & Plan: Active Problems:   Diabetes mellitus ([place])   Burkitt lymphoma, lymph nodes of multiple sites ()   Hyponatremia   Malnutrition of moderate degree   Dehydration   Palliative care by specialist   Goals of care, counseling/discussion   Symptomatic anemia   Atrophy of muscle of multiple sites   Depression   Principal Problem FTT / dehydration, severe malnutrition -due to poor po intake as well as high ostomy output. He was started on TPN on 02/09/2019 de to poor nutritional status and expected bowel dysfunction for weeks to months. Monitor for refeeding syndrome. Encourage po intake - accepted at [facility] , awaiting insurance authorization now  Active Problems Severe pan colitis -likely due to radiation toxicity, GI panel and C diff negative. CRP and sed rate elevated  Multiple electrolyte abnormalities secondary to dehydration decreased p.o. intake and increased ostomy output including  hyponatremia hypokalemia and hypomagnesemia which is being replaced  Type 2 diabetes  -Continue sliding scale, CBGs controlled  History ofright lower extremityDVT andbilateralPE  -continue Eliquis  Pancytopenia -Likely in the setting of malignancy  Depression  -started on Celexa.  Kelly Splinter MD /  [facility]   pgr [phone] 02/12/2019, 4:07 PM      Scheduled Meds: . apixaban  5 mg Oral BID  . citalopram  10 mg Oral Daily  . dexamethasone  2 mg Oral Q breakfast  . feeding supplement  1 Container Oral BID BM  . feeding supplement (ENSURE ENLIVE)  237 mL Oral BID BM  . insulin aspart  0-15 Units Subcutaneous Q4H  . oxandrolone  2.5 mg Oral BID  . zinc sulfate  220 mg Oral BID   Continuous Infusions: . TPN ADULT (ION) 70 mL/hr at 02/12/19 0600  . TPN ADULT (ION)     PRN Meds:.acetaminophen **OR** acetaminophen, heparin lock flush, heparin lock flush, HYDROcodone-acetaminophen, ondansetron **OR** ondansetron (ZOFRAN) IV, sodium chloride flush, sodium chloride flush  DVT prophylaxis: Eliquis Code Status: DNR Family Communication: discussed with patient  Disposition Plan: SNF when ready, SNF able to use TPN  Consultants:   Palliative medicine  Oncology   Procedures:   None   Antimicrobials:  None    Objective: Vitals:   02/11/19 1537 02/11/19 2005 02/12/19 0414 02/12/19 1403  BP:  105/67 104/66 101/60  Pulse: 89 77 87 92  Resp:  16 16 16   Temp:  98 F (36.7 C) 98.7 F (37.1 C) 98.5 F (36.9 C)  TempSrc:  Oral Oral Oral  SpO2:  99% 100% 96%  Weight:      Height:  Intake/Output Summary (Last 24 hours) at 02/12/2019 1606 Last data filed at 02/12/2019 1500 Gross per 24 hour  Intake 1509.93 ml  Output 2200 ml  Net -690.07 ml   Filed Weights   02/08/19 1726  Weight: 69 kg    Examination:  Constitutional: NAD Eyes: PERRL, lids and conjunctivae normal ENMT: Mucous membranes are moist. No oropharyngeal exudates Neck: normal, supple  Respiratory: clear to auscultation bilaterally, no wheezing, no crackles. Normal respiratory effort.  Cardiovascular: Regular rate and rhythm, no murmurs / rubs / gallops. No LE edema. 2+ pedal pulses.  Abdomen: no tenderness. Bowel sounds positive. Ostomy bag in place Musculoskeletal: no clubbing / cyanosis.  Skin: no rashes Neurologic: no focal deficits Psychiatric: Normal judgment and insight. Alert and oriented x 3.   Data Reviewed: I have independently reviewed following labs and imaging studies   CBC: Recent Labs  Lab 02/07/19 1014 02/08/19 0553 02/08/19 1635 02/09/19 0412 02/10/19 0359 02/11/19 0336 02/12/19 0415  WBC 6.3 5.6  --  4.9 3.9* 3.4* 3.2*  NEUTROABS 5.4  --   --  4.2 3.3  --  2.7  HGB 8.5* 7.0* 7.9* 8.8* 8.8* 8.0* 8.2*  HCT 25.9* 22.4* 24.5*  24.0* 28.0* 27.4* 26.0* 26.1*  MCV 94.9 97.4  --  94.0 93.2 95.9 96.7  PLT 128* 131*  --  108* 100* 93* 89*   Basic Metabolic Panel: Recent Labs  Lab 02/07/19 1014  02/08/19 1635 02/09/19 0412 02/10/19 0359 02/11/19 0336 02/12/19 0415  NA 125*   < > 125* 128* 128* 131* 133*  K 3.0*   < > 3.7 3.7 3.3* 4.1 4.0  CL 90*   < > 95* 98 96* 100 100  CO2 19*   < > 22 20* 22 24 26   GLUCOSE 329*   < > 191* 186* 171* 205* 129*  BUN 19   < > 11 12 12 14 15   CREATININE 0.67   < > 0.53* 0.47* 0.47* 0.37* 0.31*  CALCIUM 8.7*   < > 8.1* 8.3* 8.1* 8.2* 8.2*  MG 1.9  --   --  2.1 1.8 2.0 1.6*  PHOS  --   --   --  2.9 2.7 3.1 3.0   < > = values in this interval not displayed.   GFR: Estimated Creatinine Clearance: 93.4 mL/min (A) (by C-G formula based on SCr of 0.31 mg/dL (L)). Liver Function Tests: Recent Labs  Lab 02/07/19 1014 02/09/19 0412 02/10/19 0359 02/11/19 0336 02/12/19 0415  AST 15 13* 11* 11* 14*  ALT 30 28 24 24  36  ALKPHOS 141* 114 96 89 107  BILITOT 0.8 0.8 0.5 0.3 0.4  PROT 5.9* 5.2* 5.2* 4.9* 4.9*  ALBUMIN 2.5* 2.1* 2.0* 1.9* 1.8*   No results for input(s): LIPASE, AMYLASE in the last 168 hours.  No results for input(s): AMMONIA in the last 168 hours. Coagulation Profile: No results for input(s): INR, PROTIME in the last 168 hours. Cardiac Enzymes: No results for input(s): CKTOTAL, CKMB, CKMBINDEX, TROPONINI in the last 168 hours. BNP (last 3 results) No results for input(s): PROBNP in the last 8760 hours. HbA1C: No results for input(s): HGBA1C in the last 72 hours. CBG: Recent Labs  Lab 02/11/19 1649 02/11/19 2000 02/12/19 0410 02/12/19 0735 02/12/19 1219  GLUCAP 222* 223* 119* 123* 172*   Lipid Profile: Recent Labs    02/12/19 0415  TRIG 71   Thyroid Function Tests: No results for input(s): TSH, T4TOTAL, FREET4, T3FREE, THYROIDAB in the last 72 hours.  Anemia Panel: No results for input(s): VITAMINB12, FOLATE, FERRITIN, TIBC, IRON, RETICCTPCT in the last 72 hours. Urine analysis:    Component Value Date/Time   COLORURINE YELLOW 02/07/2019 [place] 02/07/2019 1652   LABSPEC 1.024 02/07/2019 1652   PHURINE 6.0 02/07/2019 1652   GLUCOSEU 50 (A) 02/07/2019 1652   HGBUR NEGATIVE 02/07/2019 1652   HGBUR negative 01/20/2009 0840   BILIRUBINUR NEGATIVE 02/07/2019 1652   BILIRUBINUR [place] 01/30/2018 [place] 02/07/2019 1652   PROTEINUR 30 (A) 02/07/2019 1652   UROBILINOGEN 2.0 (A) 01/30/2018 1652   UROBILINOGEN 0.2 01/20/2009 0840   NITRITE NEGATIVE 02/07/2019 1652   LEUKOCYTESUR NEGATIVE 02/07/2019 1652   Sepsis Labs: Invalid input(s): PROCALCITONIN, LACTICIDVEN  Recent Results (from the past 240 hour(s))  Blood culture (routine x 2)     Status: None   Collection Time: 02/07/19 10:14 AM  Result Value Ref Range Status   Specimen Description   Final    BLOOD LEFT ANTECUBITAL Performed at [facility] [address], [place], [place] [address]    Special Requests   Final    BOTTLES DRAWN AEROBIC AND ANAEROBIC Blood Culture adequate volume Performed at [facility] [address] [place], [place] [address]    Culture   Final    NO GROWTH 5 DAYS Performed at [facility], [place] [address] [place], [place] [address]    Report Status 02/12/2019 FINAL  Final  SARS Coronavirus 2 (CEPHEID - Performed in [place] hospital lab), Hosp Order     Status: None   Collection Time: 02/07/19 10:14 AM  Result Value Ref Range Status   SARS Coronavirus 2 NEGATIVE NEGATIVE Final    Comment: (NOTE) If result is NEGATIVE SARS-CoV-2 target nucleic acids are NOT DETECTED. The SARS-CoV-2 RNA is generally detectable in upper and lower  respiratory specimens during the acute phase of infection. The lowest  concentration of SARS-CoV-2 viral copies this assay can detect is 250  copies / mL. A negative result does not preclude SARS-CoV-2 infection  and should not be used as the sole basis for treatment or other  patient management decisions.  A negative result may occur with  improper specimen collection / handling, submission of specimen other  than nasopharyngeal swab, presence of viral mutation(s) within the  areas targeted by this assay, and inadequate number of viral copies  (<250 copies / mL). A negative result must be combined with clinical  observations, patient history, and epidemiological information. If result is POSITIVE SARS-CoV-2 target nucleic acids are DETECTED. The SARS-CoV-2 RNA is generally detectable in upper and lower  respiratory specimens dur ing the acute phase of infection.  Positive  results are indicative of active infection with SARS-CoV-2.  Clinical  correlation with patient history and other diagnostic information is  necessary to determine patient infection status.  Positive results do  not rule out bacterial infection or co-infection with other viruses. If result is PRESUMPTIVE POSTIVE SARS-CoV-2 nucleic acids MAY BE PRESENT.   A presumptive positive result was obtained on the submitted specimen  and confirmed on repeat testing.  While 2019  novel coronavirus  (SARS-CoV-2) nucleic acids may be present in the submitted sample  additional confirmatory testing may be necessary for epidemiological  and / or clinical management purposes  to differentiate between  SARS-CoV-2 and other Sarbecovirus currently known to infect humans.  If clinically indicated additional testing with an alternate test  methodology [phone]) is advised.  The SARS-CoV-2 RNA is generally  detectable in upper and lower respiratory sp ecimens during the acute  phase of infection. The expected result is Negative. Fact Sheet for Patients:  StrictlyIdeas.no Fact Sheet for Healthcare Providers: BankingDealers.co.za This test is not yet approved or cleared by the [place] FDA and has been authorized for detection and/or diagnosis of SARS-CoV-2 by FDA under an Emergency Use Authorization (EUA).  This EUA will remain in effect (meaning this test can be used) for the duration of the COVID-19 declaration under Section 564(b)(1) of the Act, 21 U.S.C. section 360bbb-3(b)(1), unless the authorization is terminated or revoked sooner. Performed at [facility], [place] [address] [place], [place] [address]   Blood culture (routine x 2)     Status: None   Collection Time: 02/07/19 11:03 AM  Result Value Ref Range Status   Specimen Description   Final    BLOOD LEFT HAND Performed at [facility] [address], [place], [place] [address]    Special Requests   Final    BOTTLES DRAWN AEROBIC AND ANAEROBIC Blood Culture adequate volume Performed at [facility] [address] [place], [place] [address]    Culture   Final    NO GROWTH 5 DAYS Performed at [facility], [place] [address], [place], [place] [address]    Report Status 02/12/2019 FINAL  Final  Urine culture     Status: None   Collection Time: 02/07/19  4:52 PM  Result Value Ref Range Status    Specimen Description   Final    URINE, RANDOM Performed at [facility] [address], [place], [place] [address]    Special Requests   Final    NONE Performed at [facility], [place] [address] [place], [place] [address]    Culture   Final    NO GROWTH Performed at [facility], [place] [address] [place], [place] [address]    Report Status 02/08/2019 FINAL  Final      Radiology Studies: No results found.    Contact via  [email or web address]  [place] P: [phone]  F: [phone]

## 2019-02-13 LAB — MAGNESIUM: Magnesium: 1.8 mg/dL (ref 1.7–2.4)

## 2019-02-13 LAB — CBC
HCT: 25 % — ABNORMAL LOW (ref 39.0–52.0)
Hemoglobin: 7.7 g/dL — ABNORMAL LOW (ref 13.0–17.0)
MCH: 30.3 pg (ref 26.0–34.0)
MCHC: 30.8 g/dL (ref 30.0–36.0)
MCV: 98.4 fL (ref 80.0–100.0)
Platelets: 90 10*3/uL — ABNORMAL LOW (ref 150–400)
RBC: 2.54 MIL/uL — ABNORMAL LOW (ref 4.22–5.81)
RDW: 19.2 % — ABNORMAL HIGH (ref 11.5–15.5)
WBC: 2.5 10*3/uL — ABNORMAL LOW (ref 4.0–10.5)
nRBC: 2.4 % — ABNORMAL HIGH (ref 0.0–0.2)

## 2019-02-13 LAB — COMPREHENSIVE METABOLIC PANEL
ALT: 38 U/L (ref 0–44)
AST: 22 U/L (ref 15–41)
Albumin: 1.8 g/dL — ABNORMAL LOW (ref 3.5–5.0)
Alkaline Phosphatase: 101 U/L (ref 38–126)
Anion gap: 8 (ref 5–15)
BUN: 15 mg/dL (ref 8–23)
CO2: 27 mmol/L (ref 22–32)
Calcium: 8.2 mg/dL — ABNORMAL LOW (ref 8.9–10.3)
Chloride: 101 mmol/L (ref 98–111)
Creatinine, Ser: 0.34 mg/dL — ABNORMAL LOW (ref 0.61–1.24)
GFR calc Af Amer: >60 mL/min (ref >60)
GFR calc non Af Amer: >60 mL/min (ref >60)
Glucose, Bld: 191 mg/dL — ABNORMAL HIGH (ref 70–99)
Potassium: 4.4 mmol/L (ref 3.5–5.1)
Sodium: 136 mmol/L (ref 135–145)
Total Bilirubin: 0.3 mg/dL (ref 0.3–1.2)
Total Protein: 4.9 g/dL — ABNORMAL LOW (ref 6.5–8.1)

## 2019-02-13 LAB — DIFFERENTIAL
Basophils Absolute: 0.1 10*3/uL (ref 0.0–0.1)
Basophils Relative: 1 %
Eosinophils Absolute: 0 10*3/uL (ref 0.0–0.5)
Eosinophils Relative: 0 %
Lymphocytes Relative: 2 %
Lymphs Abs: 0.1 10*3/uL — ABNORMAL LOW (ref 0.7–4.0)
Monocytes Absolute: 0.3 10*3/uL (ref 0.1–1.0)
Monocytes Relative: 8 %
Neutro Abs: 3 10*3/uL (ref 1.7–7.7)
Neutrophils Relative %: 82 %

## 2019-02-13 LAB — GLUCOSE, CAPILLARY
Glucose-Capillary: 167 mg/dL — ABNORMAL HIGH (ref 70–99)
Glucose-Capillary: 167 mg/dL — ABNORMAL HIGH (ref 70–99)
Glucose-Capillary: 171 mg/dL — ABNORMAL HIGH (ref 70–99)
Glucose-Capillary: 200 mg/dL — ABNORMAL HIGH (ref 70–99)
Glucose-Capillary: 231 mg/dL — ABNORMAL HIGH (ref 70–99)
Glucose-Capillary: 266 mg/dL — ABNORMAL HIGH (ref 70–99)
Glucose-Capillary: 271 mg/dL — ABNORMAL HIGH (ref 70–99)
Glucose-Capillary: 208 mg/dL — ABNORMAL HIGH (ref 70–99)
Glucose-Capillary: 320 mg/dL — ABNORMAL HIGH (ref 70–99)
Glucose-Capillary: 165 mg/dL — ABNORMAL HIGH (ref 70–99)
Glucose-Capillary: 128 mg/dL — ABNORMAL HIGH (ref 70–99)

## 2019-02-13 LAB — PREPARE RBC (CROSSMATCH)

## 2019-02-13 LAB — SEDIMENTATION RATE: Sed Rate: 130 mm/hr — ABNORMAL HIGH (ref 0–16)

## 2019-02-13 LAB — C-REACTIVE PROTEIN: CRP: 14.7 mg/dL — ABNORMAL HIGH (ref <1.0)

## 2019-02-13 LAB — LACTATE DEHYDROGENASE: LDH: 56 U/L — ABNORMAL LOW (ref 98–192)

## 2019-02-13 LAB — PHOSPHORUS: Phosphorus: 2.7 mg/dL (ref 2.5–4.6)

## 2019-02-13 MED ORDER — DIPHENHYDRAMINE HCL 25 MG PO CAPS
25.0000 mg | ORAL_CAPSULE | Freq: Once | ORAL | Status: AC
  Administered 2019-02-13: 15:00:00 25 mg via ORAL
  Filled 2019-02-13: qty 1

## 2019-02-13 MED ORDER — SODIUM CHLORIDE 0.9% IV SOLUTION
Freq: Once | INTRAVENOUS | Status: AC
  Administered 2019-02-13: 15:00:00 via INTRAVENOUS

## 2019-02-13 MED ORDER — MAGNESIUM SULFATE 2 GM/50ML IV SOLN
2.0000 g | Freq: Once | INTRAVENOUS | Status: AC
  Administered 2019-02-13: 2 g via INTRAVENOUS
  Filled 2019-02-13: qty 50

## 2019-02-13 MED ORDER — ACETAMINOPHEN 325 MG PO TABS
650.0000 mg | ORAL_TABLET | Freq: Once | ORAL | Status: AC
  Administered 2019-02-13: 650 mg via ORAL
  Filled 2019-02-13: qty 2

## 2019-02-13 MED ORDER — TRAVASOL 10 % IV SOLN
INTRAVENOUS | Status: AC
  Administered 2019-02-13: 18:00:00 via INTRAVENOUS
  Filled 2019-02-13: qty 1254

## 2019-02-13 NOTE — TOC Progression Note (Signed)
Transition of Care [facility]) - Progression Note    Patient Details  Name: Mark Frey MRN: [id number] Date of Birth: 1955/11/08  Transition of Care [facility]) CM/SW Contact  Ismahan Lippman, Juliann Pulse, RN Phone Number: 02/13/2019, 3:26 PM  Clinical Narrative: If [place] gets auth tomorrow-they would like:TPN bag(capped) to come with patient;port-a cath to be capped;ileostomy supplies. MD/nsg updated.      Expected Discharge Plan: Skilled Nursing Facility Barriers to Discharge: Insurance Authorization  Expected Discharge Plan and Services Expected Discharge Plan: [place]   Discharge Planning Services: CM Consult Post Acute Care Choice: [place] Living arrangements for the past 2 months: Single Family Home Expected Discharge Date: (unknown)                         HH Arranged: RN, PT [place] Agency: [place] ([place]) Date [place]: 02/08/19 Time [place]: 1244 Representative spoke with at [place]: [place] ([place]) Interventions    Readmission Risk Interventions Readmission Risk Prevention Plan 02/08/2019  Transportation Screening Complete  Medication Review Press photographer) Complete  PCP or Specialist appointment within 3-5 days of discharge Complete  HRI or [place] Complete  SW Recovery Care/Counseling Consult Complete  [place] Not Applicable  Some recent data might be hidden

## 2019-02-13 NOTE — Progress Notes (Signed)
[name]   HEMATOLOGY/ONCOLOGY INPATIENT PROGRESS NOTE  Date of Service: 02/13/2019  Inpatient Attending: .[name], MD   SUBJECTIVE  Patient seen this evening.  He appears to be in good spirits and notes that he is starting to feel some improvement in his appetite.  No abdominal pain.  No overt difficulty with urination.  Tolerating TPN.  Mild cytopenias are persistent.  Zinc levels are okay.  Increased oxandrolone dose to 5 mg twice daily. Discussed with patient the possible need for erythropoietin/Aranesp if his anemia of chronic disease were to get worse. No fevers.  No chills. Patient needs encouragement to ambulate and get out of bed.  OBJECTIVE: No acute distress  PHYSICAL EXAMINATION  .BP (!) 102/57 (BP Location: Left Arm)   Pulse 96   Temp 98.8 F (37.1 C)   Resp 20   Ht 5' 9"  (1.753 m)   Wt 152 lb 1.9 oz (69 kg)   SpO2 99%   BMI 22.46 kg/m  . GENERAL:alert, in no acute distress and comfortable SKIN: no acute rashes, no significant lesions EYES: conjunctiva are pink and non-injected, sclera anicteric OROPHARYNX: MMM, no exudates, no oropharyngeal erythema or ulceration NECK: supple, no JVD LYMPH:  no palpable lymphadenopathy in the cervical, axillary or inguinal regions LUNGS: clear to auscultation b/l with normal respiratory effort HEART: regular rate & rhythm ABDOMEN:  normoactive bowel sounds , non tender, not distended. Ileostomy in situ. Extremity: trace pedal edema PSYCH: alert & oriented x 3 with fluent speech NEURO: no focal motor/sensory deficits   MEDICAL HISTORY:  Past Medical History:  Diagnosis Date  . ALLERGIC RHINITIS   . Cancer ([place])    Lymphoma   . Diabetes mellitus   . Hyperlipidemia     SURGICAL HISTORY: Past Surgical History:  Procedure Laterality Date  . BIOPSY  03/15/2018   Procedure: BIOPSY;  Surgeon: Milus Banister, MD;  Location: [name];  Service: Endoscopy;;  . BOWEL RESECTION N/A 08/25/2018   Procedure: LAPROSCOPIC  LOOP ILEOSTOMY;  Surgeon: Greer Pickerel, MD;  Location: [facility];  Service: General;  Laterality: N/A;  . CLEFT PALATE REPAIR    . COLONOSCOPY N/A 03/15/2018   Procedure: COLONOSCOPY;  Surgeon: Milus Banister, MD;  Location: [facility];  Service: Endoscopy;  Laterality: N/A;  . COLONOSCOPY N/A 08/20/2018   Procedure: COLONOSCOPY;  Surgeon: Irene Shipper, MD;  Location: [facility];  Service: Endoscopy;  Laterality: N/A;  . deviated septum repair     slight improvement  . ESOPHAGOGASTRODUODENOSCOPY N/A 03/15/2018   Procedure: ESOPHAGOGASTRODUODENOSCOPY (EGD);  Surgeon: Milus Banister, MD;  Location: [name];  Service: Endoscopy;  Laterality: N/A;  . IR IMAGING GUIDED PORT INSERTION  03/17/2018  . LAPAROSCOPY N/A 08/25/2018   Procedure: LAPAROSCOPY DIAGNOSTIC;  Surgeon: Greer Pickerel, MD;  Location: [facility];  Service: General;  Laterality: N/A;  . TONSILLECTOMY      SOCIAL HISTORY: Social History   Socioeconomic History  . Marital status: Married    Spouse name: lisa  . Number of children: 0  . Years of education: Not on file  . Highest education level: Not on file  Occupational History  . Not on file  Social Needs  . Financial resource strain: Not hard at all  . Food insecurity:    Worry: Never true    Inability: Never true  . Transportation needs:    Medical: No    Non-medical: No  Tobacco Use  . Smoking status: Never Smoker  . Smokeless tobacco: Never Used  Substance and Sexual Activity  . Alcohol use: Yes    Comment: occasional  . Drug use: No  . Sexual activity: Yes  Lifestyle  . Physical activity:    Days per week: 0 days    Minutes per session: 0 min  . Stress: Not at all  Relationships  . Social connections:    Talks on phone: More than three times a week    Gets together: More than three times a week    Attends religious service: 1 to 4 times per year    Active member of club or organization: No    Attends meetings of clubs or organizations: Never     Relationship status: Married  . Intimate partner violence:    Fear of current or ex partner: No    Emotionally abused: No    Physically abused: No    Forced sexual activity: No  Other Topics Concern  . Not on file  Social History Narrative   Married 1985. No kids. 4 small dogs.       Works in Financial trader, residential      Hobbies: work on cars, Haematologist, exercise as able    FAMILY HISTORY: Family History  Problem Relation Age of Onset  . Lung cancer Mother        smoker  . Brain cancer Mother        metastasis  . AAA (abdominal aortic aneurysm) Father        smoker    ALLERGIES:  is allergic to ciprofloxacin.  MEDICATIONS:  Scheduled Meds: . apixaban  5 mg Oral BID  . citalopram  10 mg Oral Daily  . dexamethasone  1 mg Oral Q breakfast  . feeding supplement  1 Container Oral BID BM  . feeding supplement (ENSURE ENLIVE)  237 mL Oral BID BM  . insulin aspart  0-15 Units Subcutaneous Q4H  . oxandrolone  5 mg Oral BID  . zinc sulfate  220 mg Oral BID   Continuous Infusions: . TPN ADULT (ION) 95 mL/hr at 02/12/19 1704   PRN Meds:.acetaminophen **OR** acetaminophen, heparin lock flush, heparin lock flush, HYDROcodone-acetaminophen, ondansetron **OR** ondansetron (ZOFRAN) IV, sodium chloride flush, sodium chloride flush  REVIEW OF SYSTEMS:    10 Point review of Systems was done is negative except as noted above.   LABORATORY DATA:  I have reviewed the data as listed  . CBC Latest Ref Rng & Units 02/12/2019 02/11/2019  WBC 4.0 - 10.5 K/uL 3.2(L) 3.4(L)  Hemoglobin 13.0 - 17.0 g/dL 8.2(L) 8.0(L)  Hematocrit 39.0 - 52.0 % 26.1(L) 26.0(L)  Platelets 150 - 400 K/uL 89(L) 93(L)   . CMP Latest Ref Rng & Units 02/12/2019 02/11/2019  Glucose 70 - 99 mg/dL 129(H) 205(H)  BUN 8 - 23 mg/dL 15 14  Creatinine 0.61 - 1.24 mg/dL 0.31(L) 0.37(L)  Sodium 135 - 145 mmol/L 133(L) 131(L)  Potassium 3.5 - 5.1 mmol/L 4.0 4.1  Chloride 98 - 111 mmol/L 100 100  CO2 22 - 32  mmol/L 26 24  Calcium 8.9 - 10.3 mg/dL 8.2(L) 8.2(L)  Total Protein 6.5 - 8.1 g/dL 4.9(L) 4.9(L)  Total Bilirubin 0.3 - 1.2 mg/dL 0.4 0.3  Alkaline Phos 38 - 126 U/L 107 89  AST 15 - 41 U/L 14(L) 11(L)  ALT 0 - 44 U/L 36 24    RADIOGRAPHIC STUDIES: I have personally reviewed the radiological images as listed and agreed with the findings in the report. No results found.  ASSESSMENT & PLAN:  1. 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  3. H/o Small bowel obstruction/ileus with sigmoid thickening/stenosis . 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 EGreer Pickerel 4. 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 GI panel and C. difficile negative.  CMV IgM negative, EBV IgM negative. 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 Sed rate and CRP still elevated  5.Hypovolemic hyponatremia. Other electrolyte issues including hypokalemia and hypomagnesemia  6. Pancytopenia -  Anemia likely multifactorial but could be from GI bleeding plus significant inflammation from severe radiation colitis. Leukopenia/lymphopenia likely from radiation toxicity.-resolved Thrombocytopenia from consumption related to GI bleeding and from radiation toxicity.- nearly normalized. B12 within normal limits Copper levels within normal limits at 132 Zinc deficiency noted- 44 rpt this admission 60 Viral work-up not revealing Low likelihood of Burkitt's lymphoma progression at this time.  7. Admitted with failure to thrive, dehydration electrolyte abnormalities and hyperglycemia with poor p.o. intake. 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  8. Depression - discussed with patient. Continue Celexa and optimize dose to 270min 1-2 weeks.  9. Symptomatic anemia  -transfuse prn to maintain hgb>8 given symptoms and some GI losses.  10. Moderate-severe protein calorie malnutrition and muscle waste  11. Hypogonadism - secondary. Very low testosterone levels. PLAN -Labs reviewed with the patient.  Anticipate some blood count changes due to hemodilution from TPN. -optimize nutrition -will check testosterone levels - increase Oxandrolone to 76m61mo BID -Continue Eliquis back to 76mg49m BID in the setting of improved plt counts no overt bleeding and some increased risk of VTE with decreased ambulation and adding oxandrolone. -Appreciate excellent hospitalist cares. -Monitor electrolytes phosphorus, potassium and magnesemia replacement -continue Zinc replacement -continue TPN -optimize DM2 management- might need addition of basal insulin. -may need to consider ESA for anemia or chronic disease -transfuse to hgb of 8 given symptoms. PT/OT evaluation, out of bed to chair. -monitor ileostomy output and rectal output. If persistently high will need to reconsult GI and get stool studies. -Will likely need SNF on discharge -patient open to this idea today.    GautSullivan LoneMS AAnon RaicesIVMS [facility], LLCatology/Oncology Physician [facility] Hospitalffice):       [phone] cell):  [phone]           [phone]

## 2019-02-13 NOTE — Progress Notes (Addendum)
HEMATOLOGY-ONCOLOGY PROGRESS NOTE  SUBJECTIVE: Up to chair this morning. States abdomen hurts. Noted to have liquid stool in colostomy. Nursing states that she just changed this.  Appetite is fair.  He remains on TPN.  When asked additional questions, the patient is nonspecific in his answers.  He offers no other complaints this morning however.  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  Based on the IHC studies, the differential diagnosis includes diffuse large B-cell lymphoma, Burkitt lymphoma as well as but can like lymphoma.     03/15/2018 Initial Diagnosis    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[place])    03/15/2018 Procedure    Colonoscopy showed a large tumor in the proximal ascending colon, likely from the direct invasion of his abdominal lymphoma.  Biopsy confirmed high-grade B-cell lymphoma.    03/17/2018 -  Chemotherapy    R-EPOCH every 3 weeks    03/17/2018 Procedure    Bone marrow biopsy was negative for lymphoma involvement.    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 He received intrathecal methotrexate.     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)   04/10/2018 Initial Diagnosis    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[place])    04/16/2018 -  Chemotherapy    The patient had riTUXimab (RITUXAN) 900 mg in sodium chloride 0.9 % 250 mL (2.6471 mg/mL) infusion, 375 mg/m2 = 900 mg, Intravenous,  Once, 1  of 1 cycle riTUXimab (RITUXAN) 900 mg in sodium chloride 0.9 % 160 mL infusion, 375 mg/m2 = 900 mg, Intravenous,  Once, 5 of 6 cycles Administration: 900 mg (04/17/2018), 900 mg (05/08/2018), 900 mg (05/29/2018), 900 mg (07/10/2018), 900 mg (06/19/2018)  for chemotherapy treatment.       REVIEW OF SYSTEMS:   A comprehensive 14 point review of systems was negative except as noted in the HPI.  I have reviewed the past medical history, past surgical history, social history and family history with the patient and they are unchanged from previous note.   PHYSICAL EXAMINATION:  Vitals:   02/12/19 2047 02/13/19 0455  BP: 111/64 (!) 102/57  Pulse: 88 96  Resp: 18 20  Temp: 98.7 F (37.1 C) 98.8 F (37.1 C)  SpO2: 98% 99%   Filed Weights   02/08/19 1726  Weight: 152 lb 1.9 oz (69 kg)    Intake/Output from previous day: 06/01 0701 - 06/02 0700 In: 2137.5 [P.O.:270; I.V.:1867.5] Out: 2500 [Urine:600; Stool:1900]  GENERAL:alert, no distress and comfortable SKIN: No rashes or significant lesions. EYES: normal, Conjunctiva are pink and non-injected, sclera clear OROPHARYNX:no exudate, no erythema and lips, buccal mucosa, and tongue normal  NECK: supple, thyroid normal size, non-tender, without nodularity LYMPH:  no palpable lymphadenopathy in the cervical, axillary or inguinal LUNGS: clear to auscultation and percussion with normal breathing effort HEART: regular rate & rhythm and no murmurs and no lower extremity edema ABDOMEN: Soft,  mild tenderness with light palpation.  Liquid stool noted in the colostomy.  Positive bowel sounds. Musculoskeletal:no cyanosis of digits and no clubbing  NEURO: alert & oriented x 3 with fluent speech, no focal motor/sensory deficits  LABORATORY DATA:  I have reviewed the data as listed CMP Latest Ref Rng & Units 02/13/2019 02/12/2019 02/11/2019  Glucose 70 - 99 mg/dL 191(H) 129(H) 205(H)  BUN 8 - 23 mg/dL _0 Creatinine 0.61 - 1.24 mg/dL 0.34(L)  0.31(L) 0.37(L)  Sodium 135 - 145 mmol/L 136 133(L) 131(L)  Potassium 3.5 - 5.1 mmol/L 4.4 4.0 4.1  Chloride 98 - 111 mmol/L 101 100 100  CO2 22 - 32 mmol/L _1 Calcium 8.9 - 10.3 mg/dL 8.2(L) 8.2(L) 8.2(L)  Total Protein 6.5 - 8.1 g/dL 4.9(L) 4.9(L) 4.9(L)  Total Bilirubin 0.3 - 1.2 mg/dL 0.3 0.4 0.3  Alkaline Phos 38 - 126 U/L 101 107 89  AST 15 - 41 U/L 22 14(L) 11(L)  ALT 0 - 44 U/L 38 36 24    Lab Results  Component Value Date   WBC 2.5 (L) 02/13/2019   HGB 7.7 (L) 02/13/2019   HCT 25.0 (L) 02/13/2019   MCV 98.4 02/13/2019   PLT 90 (L) 02/13/2019   NEUTROABS 2.7 02/12/2019    No results found.  ASSESSMENT AND PLAN: 1. 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  3. H/o Small bowel obstruction/ileuswith sigmoid thickening/stenosis . 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 EGreer Pickerel 4. 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 GI panel and C. difficile negative.  CMV IgM negative, EBV IgM negative. 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 Sed rate and CRP still elevated  5.Hypovolemic hyponatremia. Other electrolyte issues including hypokalemia and hypomagnesemia  6. Pancytopenia -  Anemia likely multifactorial but could be from GI bleeding plus significant inflammation from severe radiation colitis. Leukopenia/lymphopenia likely from radiation toxicity. Thrombocytopenia from consumption related to GI bleeding and from radiation toxicity.- nearly normalized. B12 within normal limits Copper levels within normal limits at 132 Zinc deficiency noted- 44 rpt this admission 60 Viral work-upnot revealing Low likelihood of Burkitt's lymphoma progression at this time.  7. Admitted with failure to thrive, dehydration  electrolyte abnormalities and hyperglycemia with poor p.o. intake. 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  8. Depression - discussed with patient. Continue Celexa and optimize dose to 26min 1-2 weeks.  9. Symptomatic anemia -transfuse prn to maintain hgb>8 given symptoms and some GI losses.  10. Moderate-severe protein calorie malnutrition and muscle waste  11. Hypogonadism - secondary. Very low testosterone levels.  PLAN -Labs reviewed with the patient.  Anticipate some blood count changes due to hemodilution from TPN. -Transfuse 1 unit packed red blood cells today for hemoglobin of 7.7 with symptoms and ongoing GI blood losses. -optimize nutrition - increase Oxandrolone to 12m34mo BID -Continue Eliquis back to 12mg100m BID in the setting of improved plt counts no overt bleeding and some increased risk of VTE with decreased ambulation and adding oxandrolone. -Appreciate excellent hospitalist cares. -Monitor electrolytes phosphorus, potassium and magnesemia replacement -continue Zinc replacement -continue TPN -optimize DM2 management- might need addition of basal insulin. -may need to consider ESA for anemia or chronic disease PT/OT evaluation, out of bed to chair. -monitor ileostomy output and rectal output. If persistently high will  need to reconsult GI and get stool studies. -Patient will D/C to SNF when medically stable.    LOS: 5 days   Mikey Bussing, DNP, AGPCNP-BC, AOCNP 02/13/19  ADDENDUM  .Patient was Personally and independently interviewed, examined and relevant elements of the history of present illness were reviewed in details and an assessment and plan was created. All elements of the patient's history of present illness , assessment and plan were discussed in details with Mikey Bussing DNP. The above documentation reflects our combined findings assessment and plan.  -Given some increased lower abd discomfort and persistently elevated inflammatory  markers - will rpt CT abd/pelvis tomorrow. -receiving 1 unit of PRBC today -normal LDH -would recommend consideration of rpt stool studies GI pathology panel, c diff -plz discuss with GI and radiation oncology if any other intervention recommended for mx of radiation colitis.   Sullivan Lone MD MS

## 2019-02-13 NOTE — Progress Notes (Signed)
PHYSICAL THERAPY  Pt is familiar from prior admit.  OOB in recliner, not in the best of moods.  He expressed frustration having to "come back:".  He declined to amb this morning and asked that I return in the afternoon.  Checked back shortly after 3pm, pt still OOB in recliner sleeping getting ready to receive blood.  So will check back in am. Pt is agreeable to [facility] and hopes to gain some strength.  Rica Koyanagi  PTA Acute  Rehabilitation Services Pager      [phone] Office      [phone]

## 2019-02-13 NOTE — TOC Progression Note (Signed)
Transition of Care [facility]) - Progression Note    Patient Details  Name: Mark Frey MRN: [id number] Date of Birth: 05/10/1956  Transition of Care [facility]) CM/SW Contact  Ovidio Steele, Juliann Pulse, RN Phone Number: 02/13/2019, 11:39 AM  Clinical Narrative: If accepted by [facility] cath can be capped off @ d/c.      Expected Discharge Plan: Skilled Nursing Facility Barriers to Discharge: Insurance Authorization  Expected Discharge Plan and Services Expected Discharge Plan: [place]   Discharge Planning Services: CM Consult Post Acute Care Choice: [place] Living arrangements for the past 2 months: Single Family Home Expected Discharge Date: (unknown)                         HH Arranged: RN, PT [place] Agency: [place] ([place]) Date [place]: 02/08/19 Time [place]: 1244 Representative spoke with at [place]: [place] ([place]) Interventions    Readmission Risk Interventions Readmission Risk Prevention Plan 02/08/2019  Transportation Screening Complete  Medication Review Press photographer) Complete  PCP or Specialist appointment within 3-5 days of discharge Complete  HRI or [place] Complete  SW Recovery Care/Counseling Consult Complete  [place] Not Applicable  Some recent data might be hidden

## 2019-02-13 NOTE — Progress Notes (Signed)
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 Usual Weight: 190 lb - 15% weight loss in last month per RD  Insulin Requirements: 29 units given last 24hr, 28 units in TPN  Current Nutrition: regular diet with Boost BID and Ensure BID ( refusing all supplements),  TPN  [facility] at [facility] access: Port TPN start date: 5/29  ASSESSMENT                                                                                                          HPI: 63 yo male with burkitt's lymphoma s/p chemo now with severe radiation colitis, prolonged malnutrition, multiple recent admissions to start TPN per pharmacy management  Significant events:   Today, 02/13/19  Glucose - goal < 150 while on TPN - 29 Units SSI/24 hours;; CBGs 167 - 266 after TPN w/ 28 units hung. note hx DM and chronic dexamethasone 2 mg qday  Electrolytes - Na now WNL, mag up to 1.8 after 4 gm bolus yesterday.   Renal - WNL  LFTs - AST/ALT stable/WNL  TGs - 71 (6/1)  Prealbumin - 7.7 (6/1), oxandrin added 5/29  NUTRITIONAL GOALS                                                                                             RD recs: 2300-2500 kcal/day, 115-125g protein/day  Custom TPN at goal of 95 ml/hr to provide: 125 g/day protein, 2270Kcal/day.  PLAN                                                                                                                    Now: mag 2 gm bolus  At 1800 today:  continue custom TPN at goal rate of 95 ml/hr to provide full support  TPN to contain standard multivitamins daily and trace elements only on MWF due to [employer].  Standard  TPN  concentrations of lytes: except Na 150 mEq/L, K at 45, OV:FIEP 1:1  Continue SSI/CBGs q4h moderate scale. Increase insulin in TPN to 40 units/bag  TPN lab panels on Mondays & Thursdays. BMET, Mg, Phos in am  Will transition to cyclic TPN once CBGs better controlled  Eudelia Bunch, Pharm.D 02/13/2019 7:37 AM

## 2019-02-13 NOTE — Progress Notes (Signed)
PROGRESS NOTE    Mark Frey  TGG:[id number] DOB: 02/02/1956 DOA: 02/07/2019 PCP: [name], MD   Brief Narrative: Patient is a 63 year old male with history of Burkitt's lymphoma status post radiation and chemotherapy, status post ileostomy, diabetes mellitus, radiation colitis PE/DVT on Eliquis who presents with worsening weakness from home.  On his baseline, is able to ambulate independently with a walker but more recently he was unable to stand on his own. Patient has been started on TPN for failure to thrive.  Oncology following.  Assessment & Plan:   Active Problems:   Diabetes mellitus (HCC)   Burkitt lymphoma, lymph nodes of multiple sites ([place])   Hyponatremia   Malnutrition of moderate degree   Dehydration   Palliative care by specialist   Goals of care, counseling/discussion   Symptomatic anemia   Atrophy of muscle of multiple sites   Depression   High-grade B-cell lymphoma: History of B-cell lymphoma/Burkitt's lymphoma, stage II E.  Oncology following.  Currently in remission  History of a small bowel obstruction/ileus: Status post diverting ileostomy on 08/25/2018.  Today he is having green stool output from the ileostomy bag.  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  GI panel, C. difficile negative.  CMV, EBV negative.  Elevated ESR and CRP.  Pancytopenia: Multifactorial.  Hemoglobin this morning is 7.7.  He has been transfused with monitor PRBC.  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  Also has thrombocytopenia.  Failure to thrive/dehydration/electrolyte abnormalities: Due to low oral intake, high ostomy output.  Started on TPN on 02/09/2019.Expected bowel dysfunction for weeks to months.  Continue TPN.  Diabetes type 2: Continue sliding scale insulin.  CBGs controlled  History of right lower extremity DVT/bilateral PE: Continue Eliquis  Depression: Continue Celexa  Debility/deconditioning:  Plan is to discharge to skilled nursing facility with TPN.  He has been accepted at [facility] facility.  Awaiting insurance authorization      Nutrition Problem: Increased nutrient needs Etiology: chronic illness, cancer and cancer related treatments      DVT prophylaxis: Eliquis Code Status: DNR Family Communication: None present at bedside Disposition Plan: Skilled nursing facility after medical stabilization   Consultants: Hematology/oncology  Procedures: None  Antimicrobials:  Anti-infectives (From admission, onward)   None      Subjective: Patient seen and examined the bedside this morning.  Complains of some abdominal discomfort but no nausea or vomiting.  Has greenish stool coming from inside the stomach.   Objective: Vitals:   02/12/19 1403 02/12/19 2047 02/13/19 0455 02/13/19 1350  BP: 101/60 111/64 (!) 102/57 (!) 101/56  Pulse: 92 88 96 89  Resp: 16 18 20 18   Temp: 98.5 F (36.9 C) 98.7 F (37.1 C) 98.8 F (37.1 C) 97.9 F (36.6 C)  TempSrc: Oral Oral  Oral  SpO2: 96% 98% 99% 99%  Weight:      Height:        Intake/Output Summary (Last 24 hours) at 02/13/2019 1402 Last data filed at 02/13/2019 1045 Gross per 24 hour  Intake 2137.48 ml  Output 1825 ml  Net 312.48 ml   Filed Weights   02/08/19 1726  Weight: 69 kg    Examination:  General exam:Not in distress,debelitated HEENT:PERRL,Oral mucosa moist, Ear/Nose normal on gross exam Respiratory system: Bilateral equal air entry, normal vesicular breath sounds, no wheezes or crackles  Cardiovascular system: S1 & S2 heard, RRR. No JVD, murmurs, rubs, gallops or clicks. No pedal edema.  Port-A-Cath  on the right chest Gastrointestinal system: Abdomen is nondistended, soft .  Mild generalized tenderness.  Ileostomy with green stool in the ileostomy bag  Central nervous system: Alert and oriented. No focal neurological deficits. Extremities: No edema, no clubbing ,no cyanosis, distal  peripheral pulses palpable. Skin: No rashes, lesions or ulcers,no icterus ,no pallor  Data Reviewed: I have personally reviewed following labs and imaging studies  CBC: Recent Labs  Lab 02/07/19 1014  02/09/19 0412 02/10/19 0359 02/11/19 0336 02/12/19 0415 02/13/19 0546  WBC 6.3   < > 4.9 3.9* 3.4* 3.2* 2.5*  NEUTROABS 5.4  --  4.2 3.3  --  2.7 3.0  HGB 8.5*   < > 8.8* 8.8* 8.0* 8.2* 7.7*  HCT 25.9*   < > 28.0* 27.4* 26.0* 26.1* 25.0*  MCV 94.9   < > 94.0 93.2 95.9 96.7 98.4  PLT 128*   < > 108* 100* 93* 89* 90*   < > = values in this interval not displayed.   Basic Metabolic Panel: Recent Labs  Lab 02/09/19 0412 02/10/19 0359 02/11/19 0336 02/12/19 0415 02/13/19 0546  NA 128* 128* 131* 133* 136  K 3.7 3.3* 4.1 4.0 4.4  CL 98 96* 100 100 101  CO2 20* 22 24 26 27   GLUCOSE 186* 171* 205* 129* 191*  BUN 12 12 14 15 15   CREATININE 0.47* 0.47* 0.37* 0.31* 0.34*  CALCIUM 8.3* 8.1* 8.2* 8.2* 8.2*  MG 2.1 1.8 2.0 1.6* 1.8  PHOS 2.9 2.7 3.1 3.0 2.7   GFR: Estimated Creatinine Clearance: 93.4 mL/min (A) (by C-G formula based on SCr of 0.34 mg/dL (L)). Liver Function Tests: Recent Labs  Lab 02/09/19 0412 02/10/19 0359 02/11/19 0336 02/12/19 0415 02/13/19 0546  AST 13* 11* 11* 14* 22  ALT 28 24 24  36 38  ALKPHOS 114 96 89 107 101  BILITOT 0.8 0.5 0.3 0.4 0.3  PROT 5.2* 5.2* 4.9* 4.9* 4.9*  ALBUMIN 2.1* 2.0* 1.9* 1.8* 1.8*   No results for input(s): LIPASE, AMYLASE in the last 168 hours. No results for input(s): AMMONIA in the last 168 hours. Coagulation Profile: No results for input(s): INR, PROTIME in the last 168 hours. Cardiac Enzymes: No results for input(s): CKTOTAL, CKMB, CKMBINDEX, TROPONINI in the last 168 hours. BNP (last 3 results) No results for input(s): PROBNP in the last 8760 hours. HbA1C: No results for input(s): HGBA1C in the last 72 hours. CBG: Recent Labs  Lab 02/12/19 2047 02/12/19 2340 02/13/19 0457 02/13/19 0731 02/13/19 1145   GLUCAP 266* 271* 167* 167* 208*   Lipid Profile: Recent Labs    02/12/19 0415  TRIG 71   Thyroid Function Tests: No results for input(s): TSH, T4TOTAL, FREET4, T3FREE, THYROIDAB in the last 72 hours. Anemia Panel: No results for input(s): VITAMINB12, FOLATE, FERRITIN, TIBC, IRON, RETICCTPCT in the last 72 hours. Sepsis Labs: Recent Labs  Lab 02/07/19 1014 02/07/19 1303  LATICACIDVEN 3.6* 2.4*    Recent Results (from the past 240 hour(s))  Blood culture (routine x 2)     Status: None   Collection Time: 02/07/19 10:14 AM  Result Value Ref Range Status   Specimen Description   Final    BLOOD LEFT ANTECUBITAL Performed at [facility] [address], [place], [place] [address]    Special Requests   Final    BOTTLES DRAWN AEROBIC AND ANAEROBIC Blood Culture adequate volume Performed at [facility] [address] [place], [place] [address]    Culture  Final    NO GROWTH 5 DAYS Performed at [facility], [place] [address], [name], [place] [address]    Report Status 02/12/2019 FINAL  Final  SARS Coronavirus 2 (CEPHEID - Performed in [place] hospital lab), Hosp Order     Status: None   Collection Time: 02/07/19 10:14 AM  Result Value Ref Range Status   SARS Coronavirus 2 NEGATIVE NEGATIVE Final    Comment: (NOTE) If result is NEGATIVE SARS-CoV-2 target nucleic acids are NOT DETECTED. The SARS-CoV-2 RNA is generally detectable in upper and lower  respiratory specimens during the acute phase of infection. The lowest  concentration of SARS-CoV-2 viral copies this assay can detect is 250  copies / mL. A negative result does not preclude SARS-CoV-2 infection  and should not be used as the sole basis for treatment or other  patient management decisions.  A negative result may occur with  improper specimen collection / handling, submission of specimen other  than nasopharyngeal swab, presence of viral mutation(s) within the   areas targeted by this assay, and inadequate number of viral copies  (<250 copies / mL). A negative result must be combined with clinical  observations, patient history, and epidemiological information. If result is POSITIVE SARS-CoV-2 target nucleic acids are DETECTED. The SARS-CoV-2 RNA is generally detectable in upper and lower  respiratory specimens dur ing the acute phase of infection.  Positive  results are indicative of active infection with SARS-CoV-2.  Clinical  correlation with patient history and other diagnostic information is  necessary to determine patient infection status.  Positive results do  not rule out bacterial infection or co-infection with other viruses. If result is PRESUMPTIVE POSTIVE SARS-CoV-2 nucleic acids MAY BE PRESENT.   A presumptive positive result was obtained on the submitted specimen  and confirmed on repeat testing.  While 2019 novel coronavirus  (SARS-CoV-2) nucleic acids may be present in the submitted sample  additional confirmatory testing may be necessary for epidemiological  and / or clinical management purposes  to differentiate between  SARS-CoV-2 and other Sarbecovirus currently known to infect humans.  If clinically indicated additional testing with an alternate test  methodology [phone]) is advised. The SARS-CoV-2 RNA is generally  detectable in upper and lower respiratory sp ecimens during the acute  phase of infection. The expected result is Negative. Fact Sheet for Patients:  StrictlyIdeas.no Fact Sheet for Healthcare Providers: BankingDealers.co.za This test is not yet approved or cleared by the [place] FDA and has been authorized for detection and/or diagnosis of SARS-CoV-2 by FDA under an Emergency Use Authorization (EUA).  This EUA will remain in effect (meaning this test can be used) for the duration of the COVID-19 declaration under Section 564(b)(1) of the Act, 21 U.S.C.  section 360bbb-3(b)(1), unless the authorization is terminated or revoked sooner. Performed at [facility], [place] [address], [place], [place] [address]   Blood culture (routine x 2)     Status: None   Collection Time: 02/07/19 11:03 AM  Result Value Ref Range Status   Specimen Description   Final    BLOOD LEFT HAND Performed at [facility] [address] [place], [place] [address]    Special Requests   Final    BOTTLES DRAWN AEROBIC AND ANAEROBIC Blood Culture adequate volume Performed at [facility] [address], [place], [place] [address]    Culture   Final    NO GROWTH 5 DAYS Performed at [facility], [address], [place], [place] [address]    Report Status 02/12/2019 FINAL  Final  Urine culture     Status: None   Collection Time: 02/07/19  4:52 PM  Result Value Ref Range Status   Specimen Description   Final    URINE, RANDOM Performed at [facility] [address], [place], [place] [address]    Special Requests   Final    NONE Performed at [facility], [place] [address] [place], [place] [address]    Culture   Final    NO GROWTH Performed at [facility], [place] [address] [place], [place] [address]    Report Status 02/08/2019 FINAL  Final         Radiology Studies: No results found.      Scheduled Meds: . sodium chloride   Intravenous Once  . acetaminophen  650 mg Oral Once  . apixaban  5 mg Oral BID  . citalopram  10 mg Oral Daily  . dexamethasone  1 mg Oral Q breakfast  . diphenhydrAMINE  25 mg Oral Once  . feeding supplement  1 Container Oral BID BM  . feeding supplement (ENSURE ENLIVE)  237 mL Oral BID BM  . insulin aspart  0-15 Units Subcutaneous Q4H  . oxandrolone  5 mg Oral BID  . zinc sulfate  220 mg Oral BID   Continuous Infusions: . TPN ADULT (ION) 95 mL/hr at 02/12/19 1704  . TPN ADULT (ION)       LOS: 5 days     Time spent: 35 mins.More than 50% of that time was spent in counseling and/or coordination of care.      [name], MD [facility] Pager [phone]  If 7PM-7AM, please contact night-coverage [email or web address] Password [email or web address] 02/13/2019, 2:02 PM

## 2019-02-13 NOTE — Progress Notes (Signed)
Occupational Therapy Treatment Patient Details Name: Mark Frey MRN: [id number] DOB: [birth year] Today's Date: 02/13/2019    History of present illness 63 y.o. 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  Pt with significant tremor; had difficulty with beverage. Returned in pm with lidded mug and pt able to manage more successfully, with and without straw.  Pt declined further mobility:  Up in chair.  Will continue to follow  Follow Up Recommendations  SNF;Supervision/Assistance - 24 hour    Equipment Recommendations  None recommended by OT    Recommendations for Other Services      Precautions / Restrictions Precautions Precautions: Fall Precaution Comments: ileostomy Restrictions Weight Bearing Restrictions: No       Mobility Bed Mobility                  Transfers                      Balance      Pt up in chair.  Repositioned this am; when I returned in pm, he declined standing                                     ADL either performed or assessed with clinical judgement   ADL   Eating/Feeding: Minimal assistance;Sitting Eating/Feeding Details (indicate cue type and reason): tremor present; had difficulty using straw:  did better without this. Pt hasn't been eating, only drinking Grooming: Wash/dry hands;Set up;Sitting                                 General ADL Comments: provided lidded mug     Vision       Perception     Praxis      Cognition Arousal/Alertness: Awake/alert Behavior During Therapy: Flat affect Overall Cognitive Status: No family/caregiver present to determine baseline cognitive functioning                                 General Comments:  pt not very verbal, one word responses and perseverated on these.  Nodded and shook head.  Did not use call bell for needs; I heard him yelling from room.  More interactive when I stopped back to bring lidded cup        Exercises     Shoulder Instructions       General Comments      Pertinent Vitals/ Pain       Pain Assessment: Faces Faces Pain Scale: Hurts even more Pain Location: stomach and back Pain Descriptors / Indicators: Grimacing;Moaning Pain Intervention(s): Limited activity within patient's tolerance;Monitored during session;Repositioned;Patient requesting pain meds-RN notified;RN gave pain meds during session  Home Living                                          Prior Functioning/Environment              Frequency  Min 2X/week  Progress Toward Goals  OT Goals(current goals can now be found in the care plan section)  Progress towards OT goals: Not progressing toward goals - comment     Plan      Co-evaluation                 AM-PAC OT "6 Clicks" Daily Activity     Outcome Measure   Help from another person eating meals?: A Little Help from another person taking care of personal grooming?: A Little Help from another person toileting, which includes using toliet, bedpan, or urinal?: A Little Help from another person bathing (including washing, rinsing, drying)?: A Lot Help from another person to put on and taking off regular upper body clothing?: A Little Help from another person to put on and taking off regular lower body clothing?: A Lot 6 Click Score: 16    End of Session    OT Visit Diagnosis: Unsteadiness on feet (R26.81);Muscle weakness (generalized) (M62.81);Adult, failure to thrive (R62.7);Pain   Activity Tolerance Patient limited by pain   Patient Left in chair;with call bell/phone within reach;with chair alarm set;with nursing/sitter in room   Nurse Communication          Time: 0454-0981 and 1245  - 1248 OT Time Calculation (min): 5mn  Charges: OT General Charges $OT Visit: 1 Visit OT Treatments $Self Care/Home Management : 8-22 mins  MLesle Chris OTR/L Acute Rehabilitation Services [phone] Sho[place] [phone] 02/13/2019   Bernedette Auston 02/13/2019, 2:45 PM

## 2019-02-14 ENCOUNTER — Telehealth: Payer: Self-pay | Admitting: Gastroenterology

## 2019-02-14 ENCOUNTER — Inpatient Hospital Stay (HOSPITAL_COMMUNITY): Payer: BC Managed Care – PPO

## 2019-02-14 ENCOUNTER — Encounter (HOSPITAL_COMMUNITY): Payer: Self-pay | Admitting: Radiology

## 2019-02-14 ENCOUNTER — Inpatient Hospital Stay: Payer: BC Managed Care – PPO | Admitting: Hematology

## 2019-02-14 DIAGNOSIS — F329 Major depressive disorder, single episode, unspecified: Secondary | ICD-10-CM

## 2019-02-14 DIAGNOSIS — E876 Hypokalemia: Secondary | ICD-10-CM

## 2019-02-14 DIAGNOSIS — D61818 Other pancytopenia: Secondary | ICD-10-CM

## 2019-02-14 DIAGNOSIS — E861 Hypovolemia: Secondary | ICD-10-CM

## 2019-02-14 DIAGNOSIS — Z8719 Personal history of other diseases of the digestive system: Secondary | ICD-10-CM

## 2019-02-14 DIAGNOSIS — Z86718 Personal history of other venous thrombosis and embolism: Secondary | ICD-10-CM

## 2019-02-14 DIAGNOSIS — D6959 Other secondary thrombocytopenia: Secondary | ICD-10-CM

## 2019-02-14 DIAGNOSIS — E871 Hypo-osmolality and hyponatremia: Secondary | ICD-10-CM

## 2019-02-14 DIAGNOSIS — Z86711 Personal history of pulmonary embolism: Secondary | ICD-10-CM

## 2019-02-14 DIAGNOSIS — Z7901 Long term (current) use of anticoagulants: Secondary | ICD-10-CM

## 2019-02-14 DIAGNOSIS — R739 Hyperglycemia, unspecified: Secondary | ICD-10-CM

## 2019-02-14 DIAGNOSIS — Y842 Radiological procedure and radiotherapy as the cause of abnormal reaction of the patient, or of later complication, without mention of misadventure at the time of the procedure: Secondary | ICD-10-CM

## 2019-02-14 DIAGNOSIS — R933 Abnormal findings on diagnostic imaging of other parts of digestive tract: Secondary | ICD-10-CM

## 2019-02-14 DIAGNOSIS — Z932 Ileostomy status: Secondary | ICD-10-CM

## 2019-02-14 DIAGNOSIS — R627 Adult failure to thrive: Secondary | ICD-10-CM

## 2019-02-14 LAB — BASIC METABOLIC PANEL
Anion gap: 8 (ref 5–15)
BUN: 18 mg/dL (ref 8–23)
CO2: 24 mmol/L (ref 22–32)
Calcium: 8.1 mg/dL — ABNORMAL LOW (ref 8.9–10.3)
Chloride: 101 mmol/L (ref 98–111)
Creatinine, Ser: 0.38 mg/dL — ABNORMAL LOW (ref 0.61–1.24)
GFR calc Af Amer: >60 mL/min (ref >60)
GFR calc non Af Amer: >60 mL/min (ref >60)
Glucose, Bld: 197 mg/dL — ABNORMAL HIGH (ref 70–99)
Potassium: 4 mmol/L (ref 3.5–5.1)
Sodium: 133 mmol/L — ABNORMAL LOW (ref 135–145)

## 2019-02-14 LAB — CBC WITH DIFFERENTIAL/PLATELET
Abs Immature Granulocytes: 0.12 10*3/uL — ABNORMAL HIGH (ref 0.00–0.07)
Basophils Absolute: 0 10*3/uL (ref 0.0–0.1)
Basophils Relative: 1 %
Eosinophils Absolute: 0 10*3/uL (ref 0.0–0.5)
Eosinophils Relative: 0 %
HCT: 29.1 % — ABNORMAL LOW (ref 39.0–52.0)
Hemoglobin: 9 g/dL — ABNORMAL LOW (ref 13.0–17.0)
Immature Granulocytes: 4 %
Lymphocytes Relative: 4 %
Lymphs Abs: 0.1 10*3/uL — ABNORMAL LOW (ref 0.7–4.0)
MCH: 30 pg (ref 26.0–34.0)
MCHC: 30.9 g/dL (ref 30.0–36.0)
MCV: 97 fL (ref 80.0–100.0)
Monocytes Absolute: 0.3 10*3/uL (ref 0.1–1.0)
Monocytes Relative: 10 %
Neutro Abs: 2.4 10*3/uL (ref 1.7–7.7)
Neutrophils Relative %: 81 %
Platelets: 85 10*3/uL — ABNORMAL LOW (ref 150–400)
RBC: 3 MIL/uL — ABNORMAL LOW (ref 4.22–5.81)
RDW: 18.6 % — ABNORMAL HIGH (ref 11.5–15.5)
WBC: 3 10*3/uL — ABNORMAL LOW (ref 4.0–10.5)
nRBC: 0 % (ref 0.0–0.2)

## 2019-02-14 LAB — TYPE AND SCREEN
ABO/RH(D): O POS
Antibody Screen: NEGATIVE
Unit division: 0

## 2019-02-14 LAB — GLUCOSE, CAPILLARY
Glucose-Capillary: 138 mg/dL — ABNORMAL HIGH (ref 70–99)
Glucose-Capillary: 149 mg/dL — ABNORMAL HIGH (ref 70–99)
Glucose-Capillary: 156 mg/dL — ABNORMAL HIGH (ref 70–99)
Glucose-Capillary: 160 mg/dL — ABNORMAL HIGH (ref 70–99)
Glucose-Capillary: 166 mg/dL — ABNORMAL HIGH (ref 70–99)
Glucose-Capillary: 231 mg/dL — ABNORMAL HIGH (ref 70–99)

## 2019-02-14 LAB — PHOSPHORUS: Phosphorus: 3.2 mg/dL (ref 2.5–4.6)

## 2019-02-14 LAB — BPAM RBC
Blood Product Expiration Date: 202006272359
ISSUE DATE / TIME: 202006021535
Unit Type and Rh: 5100

## 2019-02-14 LAB — MAGNESIUM: Magnesium: 1.6 mg/dL — ABNORMAL LOW (ref 1.7–2.4)

## 2019-02-14 MED ORDER — IOHEXOL 300 MG/ML  SOLN
30.0000 mL | Freq: Once | INTRAMUSCULAR | Status: AC | PRN
  Administered 2019-02-14: 30 mL via ORAL

## 2019-02-14 MED ORDER — TRAVASOL 10 % IV SOLN
INTRAVENOUS | Status: AC
  Administered 2019-02-14: 18:00:00 via INTRAVENOUS
  Filled 2019-02-14: qty 1254

## 2019-02-14 MED ORDER — IOHEXOL 300 MG/ML  SOLN
100.0000 mL | Freq: Once | INTRAMUSCULAR | Status: AC | PRN
  Administered 2019-02-14: 100 mL via INTRAVENOUS

## 2019-02-14 MED ORDER — APIXABAN 5 MG PO TABS
5.0000 mg | ORAL_TABLET | Freq: Two times a day (BID) | ORAL | Status: DC
  Administered 2019-02-16 – 2019-02-19 (×7): 5 mg via ORAL
  Filled 2019-02-14 (×7): qty 1

## 2019-02-14 MED ORDER — SODIUM CHLORIDE (PF) 0.9 % IJ SOLN
INTRAMUSCULAR | Status: AC
  Administered 2019-02-14: 12:00:00
  Filled 2019-02-14: qty 50

## 2019-02-14 MED ORDER — PIPERACILLIN-TAZOBACTAM 3.375 G IVPB
3.3750 g | Freq: Three times a day (TID) | INTRAVENOUS | Status: DC
  Administered 2019-02-14 – 2019-02-24 (×31): 3.375 g via INTRAVENOUS
  Filled 2019-02-14 (×28): qty 50

## 2019-02-14 MED ORDER — MAGNESIUM SULFATE 4 GM/100ML IV SOLN
4.0000 g | Freq: Once | INTRAVENOUS | Status: AC
  Administered 2019-02-14: 4 g via INTRAVENOUS
  Filled 2019-02-14: qty 100

## 2019-02-14 MED ORDER — CHOLESTYRAMINE LIGHT 4 G PO PACK
4.0000 g | PACK | ORAL | Status: DC
  Filled 2019-02-14 (×62): qty 1

## 2019-02-14 NOTE — Progress Notes (Signed)
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 Usual Weight: 190 lb - 15% weight loss in last month per RD  Insulin Requirements: 26 units given last 24hr, 40 units in TPN  Current Nutrition: regular diet with Boost BID and Ensure BID ( refusing all supplements),  TPN  IVF: none  Central access: Port TPN start date: 5/29  ASSESSMENT                                                                                                          HPI: 63 yo male with burkitt's lymphoma s/p chemo now with severe radiation colitis, prolonged malnutrition, multiple recent admissions to start TPN per pharmacy management  Significant events:  6/3 Onc ordered repeat CT abd/pelvis d/t incr abd discomfort and persistently elevated inflammatory markers  Today, 02/14/19  Glucose - goal < 150 while on TPN - 26 Units SSI/24 hours;; CBGs 128, 138,166, 149 after TPN w/ 40 units hung. note hx DM and chronic dexamethasone 2 mg qday> decreased to 1 mg/day 6/2  Electrolytes -  Na 133, K 4, Mag low at 1.6 after 2 gm bolus yesterday; phos 3.2  Renal - WNL  LFTs - AST/ALT stable/WNL  TGs - 71 (6/1)  Prealbumin - 7.7 (6/1), oxandrin added 5/29, dose increased 6/1  NUTRITIONAL GOALS                                                                                             RD recs: 2300-2500 kcal/day, 115-125g protein/day  Custom TPN at goal of 95 ml/hr to provide: 125 g/day protein, 2270Kcal/day.  PLAN                                                                                                                    Now: mag 4 gm IV x 1  At 1800 today:  continue custom TPN with  2280  mls/day and start cyclic TPN over 18  Hours to provide full support.   TPN to contain standard multivitamins daily and trace elements only on MWF due to [employer].  Standard TPN concentrations of lytes: except Na 155 mEq/L, K at 45, Mag 8, DZ:HGDJ 1:1  Continue modSSI/CBGs   continue insulin in TPN to 40 units/bag  TPN lab panels on Mondays & Thursdays.  Eudelia Bunch, Pharm.D 02/14/2019 7:56 AM

## 2019-02-14 NOTE — Progress Notes (Addendum)
HEMATOLOGY-ONCOLOGY PROGRESS NOTE  SUBJECTIVE: More alert and talkative this morning.  Continues to complain of ongoing abdominal discomfort.  He is having mucus through his colostomy.  CT of the abdomen and pelvis has been ordered and will likely be done later today.  He has no other new complaints this morning.  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  Based on the IHC studies, the differential diagnosis includes diffuse large B-cell lymphoma, Burkitt lymphoma as well as but can like lymphoma.     03/15/2018 Initial Diagnosis    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[place])    03/15/2018 Procedure    Colonoscopy showed a large tumor in the proximal ascending colon, likely from the direct invasion of his abdominal lymphoma.  Biopsy confirmed high-grade B-cell lymphoma.    03/17/2018 -  Chemotherapy    R-EPOCH every 3 weeks    03/17/2018 Procedure    Bone marrow biopsy was negative for lymphoma involvement.    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 He received intrathecal methotrexate.     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[place])   04/10/2018 Initial Diagnosis    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[place])    04/16/2018 -  Chemotherapy    The patient had riTUXimab (RITUXAN) 900 mg in sodium chloride 0.9 % 250 mL (2.6471 mg/mL) infusion, 375 mg/m2 = 900 mg, Intravenous,  Once, 1 of 1 cycle riTUXimab (RITUXAN)  900 mg in sodium chloride 0.9 % 160 mL infusion, 375 mg/m2 = 900 mg, Intravenous,  Once, 5 of 6 cycles Administration: 900 mg (04/17/2018), 900 mg (05/08/2018), 900 mg (05/29/2018), 900 mg (07/10/2018), 900 mg (06/19/2018)  for chemotherapy treatment.       REVIEW OF SYSTEMS:   A comprehensive 14 point review of systems was negative except as noted in the HPI.  I have reviewed the past medical history, past surgical history, social history and family history with the patient and they are unchanged from previous note.   PHYSICAL EXAMINATION:  Vitals:   02/13/19 2222 02/14/19 0511  BP: 105/62 112/65  Pulse: 83 94  Resp: 18   Temp: 98.8 F (37.1 C) 98.8 F (37.1 C)  SpO2: 100% 98%   Filed Weights   02/08/19 1726  Weight: 152 lb 1.9 oz (69 kg)    Intake/Output from previous day: 06/02 0701 - 06/03 0700 In: 2283.9 [I.V.:1968.9; Blood:315] Out: 1610 [Urine:300; Stool:825]  GENERAL:alert, no distress and comfortable SKIN: No rashes or significant lesions. EYES: normal, Conjunctiva are pink and non-injected, sclera clear OROPHARYNX:no exudate, no erythema and lips, buccal mucosa, and tongue normal  NECK: supple, thyroid normal size, non-tender, without nodularity LYMPH:  no palpable lymphadenopathy in the cervical, axillary or inguinal LUNGS: clear to auscultation and percussion with normal breathing effort HEART: regular rate & rhythm and no murmurs and 1+ bilateral lower extremity edema. ABDOMEN: Soft, mild tenderness with light  palpation.  Liquid stool noted in the colostomy.  Positive bowel sounds. Musculoskeletal:no cyanosis of digits and no clubbing  NEURO: alert & oriented x 3,no focal motor/sensory deficits.  Flat affect.  LABORATORY DATA:  I have reviewed the data as listed CMP Latest Ref Rng & Units 02/14/2019 02/13/2019 02/12/2019  Glucose 70 - 99 mg/dL 197(H) 191(H) 129(H)  BUN 8 - 23 mg/dL 18 15 15   Creatinine 0.61 - 1.24 mg/dL 0.38(L) 0.34(L) 0.31(L)  Sodium 135 - 145  mmol/L 133(L) 136 133(L)  Potassium 3.5 - 5.1 mmol/L 4.0 4.4 4.0  Chloride 98 - 111 mmol/L 101 101 100  CO2 22 - 32 mmol/L 24 27 26   Calcium 8.9 - 10.3 mg/dL 8.1(L) 8.2(L) 8.2(L)  Total Protein 6.5 - 8.1 g/dL - 4.9(L) 4.9(L)  Total Bilirubin 0.3 - 1.2 mg/dL - 0.3 0.4  Alkaline Phos 38 - 126 U/L - 101 107  AST 15 - 41 U/L - 22 14(L)  ALT 0 - 44 U/L - 38 36    Lab Results  Component Value Date   WBC 3.0 (L) 02/14/2019   HGB 9.0 (L) 02/14/2019   HCT 29.1 (L) 02/14/2019   MCV 97.0 02/14/2019   PLT 85 (L) 02/14/2019   NEUTROABS PENDING 02/14/2019    No results found.  ASSESSMENT AND PLAN: 1. 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  3. H/o Small bowel obstruction/ileuswith sigmoid thickening/stenosis . 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 EGreer Pickerel 4. 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 GI panel and C. difficile negative.  CMV IgM negative, EBV IgM negative. 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 Sed rate and CRP still elevated  5.Hypovolemic hyponatremia. Other electrolyte issues including hypokalemia and hypomagnesemia  6. Pancytopenia -  Anemia likely multifactorial but could be from GI bleeding plus significant inflammation from severe radiation colitis. Leukopenia/lymphopenia likely from radiation toxicity. Thrombocytopenia from consumption related to GI bleeding and from radiation toxicity.- nearly normalized. B12 within normal limits Copper levels within normal limits at 132 Zinc deficiency noted- 44 rpt this admission 60 Viral work-upnot revealing Low likelihood of Burkitt's lymphoma progression at this time.  7. Admitted with failure to thrive, dehydration electrolyte abnormalities and hyperglycemia  with poor p.o. intake. 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  8. Depression - discussed with patient. Continue Celexa and optimize dose to 2840min 1-2 weeks.  9. Symptomatic anemia -transfuse prn to maintain hgb>8 given symptoms and some GI losses.  10. Moderate-severe protein calorie malnutrition and muscle waste  11. Hypogonadism - secondary. Very low testosterone levels.  PLAN -Labs reviewed with the patient.  Anticipate some blood count changes due to hemodilution from TPN. -Recommend PRBC transfusion for hemoglobin less than 8.0 due to ongoing GI blood losses.  Received 1 unit packed red blood cells on 02/13/2019.  Hemoglobin has improved to 9.0. -optimize nutrition -Continue Oxandrolone to 40m43mo BID -Continue Eliquis back to 40mg50m BID in the setting of improved plt counts no overt bleeding and some increased risk of VTE with decreased ambulation and adding oxandrolone. -Appreciate excellent hospitalist cares. -Monitor electrolytes phosphorus, potassium and magnesemia replacement -continue Zinc replacement -continue TPN -optimize DM2 management- might need addition of basal insulin. -may need to consider ESA for anemia or chronic disease PT/OT evaluation, out of bed to chair. -monitor ileostomy output and rectal  output. If persistently high will need to reconsult GI and get stool studies.  Also consider talking with radiation oncology regarding any additional intervention recommended for treatment of radiation colitis. -Patient will D/C to SNF when medically stable.    LOS: 6 days   Mikey Bussing, DNP, AGPCNP-BC, AOCNP 02/14/19  ADDENDUM  .Patient was Personally and independently interviewed, examined and relevant elements of the history of present illness were reviewed in details and an assessment and plan was created. All elements of the patient's history of present illness , assessment and plan were discussed in details with Mikey Bussing, DNP. The above  documentation reflects our combined findings assessment and plan.  Patient had CT abdomen pelvis which shows new paracolonic fluid collections concerning for abscesses.  Has been started on IV Zosyn.  Has been seen by surgery and interventional radiology.  No intervention planned at this point. Surgery recommends oral clears and continue TPN which is appropriate. Will need IV antibiotics for several days and possible infectious disease input to determine total course and choice of antibiotics from a perspective of discharge planning to a SNF. He notes that he did stand up and walk with a walker with therapies. Updated his wife [name] on the phone and answered her extensive questions. He appears to be in good spirits and feels a little brighter hopefully he can continue his upswing. He is having stool studies given his persistent high output of stools to rule out C. difficile and other GI pathogens. Blood cultures pending   Sullivan Lone MD MS

## 2019-02-14 NOTE — Telephone Encounter (Signed)
Pt aware by message to notify the nurse at the hospital if she has questions about his condition.  GI can be consulted if needed.

## 2019-02-14 NOTE — Progress Notes (Signed)
Patient with history of Burkitt's Lymphoma, pancytopenia admitted 5/27 with acute abdominal pain.  Patient does have loop ileostomy due to GI involvement.  He was suspected to have radiation-induced pancolitis.  He continued with abdominal pain, high ileostomy output with mucus.   WBC 3.0 Afebrile Blood cultures pending.  CT Abdomen/Pelvis today showed: Multifocal fluid collections along the course of the descending and sigmoid colon as described consistent with developing abscesses. The largest of these lies along the margin of the sigmoid colon as described above. These changes are new from prior exam.  IR consulted for evaluation of fluid collection for potential aspiration and drainage.  Imaging reviewed by Dr. Kathlene Cote who notes no dominant area with collections intimately associated with the bowel.  He would not recommend drainage at this time. Patient has initiated IV Zosyn.  Dr. Kathlene Cote recommends repeat CT Abdomen/Pelvis after antibiotic treatment.  No procedure planned in IR at this time.   Brynda Greathouse, MS RD PA-C

## 2019-02-14 NOTE — TOC Transition Note (Signed)
Transition of Care [facility]) - CM/SW Discharge Note   Patient Details  Name: Mark Frey MRN: [id number] Date of Birth: [birth year]  Transition of Care [facility] CM/SW Contact:  Dessa Phi, RN Phone Number: 02/14/2019, 11:40 AM   Clinical Narrative:   Awaiting medical stablility. [name] has auth for SNF-please provide TPN to go with patient to facility, [facility] cath to be capped, ileostomy supplies.    Final next level of care: Skilled Nursing Facility Barriers to Discharge: Continued Medical Work up(have received insurance auth.)   Patient Goals and CMS Choice Patient states their goals for this hospitalization and ongoing recovery are:: go home CMS Medicare.gov Compare Post Acute Care list provided to:: Patient Choice offered to / list presented to : Patient  Discharge Placement              Patient chooses bed at: [facility])        Discharge Plan and Services   Discharge Planning Services: CM Consult Post Acute Care Choice: [place]: RN, PT [facility] Agency: [place] ([place]) Date [facility] Agency Contacted: 02/08/19 Time [place]: 1244 Representative spoke with at [place]: [place] ([place]) Interventions     Readmission Risk Interventions Readmission Risk Prevention Plan 02/08/2019  Transportation Screening Complete  Medication Review Press photographer) Complete  PCP or Specialist appointment within 3-5 days of discharge Complete  HRI or [place] Complete  SW Recovery Care/Counseling Consult Complete  [place] Not Applicable  Some recent data might be hidden

## 2019-02-14 NOTE — Progress Notes (Signed)
Nutrition Follow-up  RD working remotely.   DOCUMENTATION CODES:   Non-severe (moderate) malnutrition in context of chronic illness  INTERVENTION:  - continue TPN per Pharmacy.  - consider cyclic TPN for patient who is anticipated to d/c on TPN. - will d/c boost breeze and ensure enlive supplements. - will order magic cup TID with meals, each supplement provides 290 kcal and 9 grams of protein. - continue to encourage PO intakes. - will order 24 hour calorie count and follow-up on results 6/4.   NUTRITION DIAGNOSIS:   Increased nutrient needs related to chronic illness, cancer and cancer related treatments as evidenced by estimated needs. -ongoing  GOAL:   Patient will meet greater than or equal to 90% of their needs -met with TPN regimen alone  MONITOR:   PO intake, Supplement acceptance, Labs, Weight trends, Skin, I & O's, Other (Comment)(TPN regimen)  ASSESSMENT:   63 y.o. male with medical history significant of Burkitt's lymphoma s/p radiation and chemotherapy, s/p ileostomy, Type 2 DM, radiation colitis, and PE/DVT on Eliquis. He presented to the ED with 3 week history of worsening weakness. At baseline, he is able to ambulate independently with a walker but more recently was unable to stand up on his own. The patient and his wife went to his PCP on 5/26 to discuss decline and to assess capacity. Patient, at that time was considering hospice referral, but has now opted for acute medical treatment.   Patient has not been weighed since admission (5/28). He is on a Regular diet and the only intakes documented in flow sheet are 80% of lunch on 5/28 and 30% of breakfast on 5/31. Ordering 24-hour Calorie Count and will follow-up on this tomorrow when this RD is on site.   Patient has R chest port and is receiving custom TPN at goal rate of 95 ml/hr which is providing 2270 kcal (99% estimated kcal need), 125 grams protein (100% estimated protein need). Recommend cyclic TPN. May also  need to consider decreasing TPN to meet a lower percentage of estimated kcal and protein needs in order to encourage PO intakes; will continue to assess this. Need to monitor for signs/symptoms of copper and/or iron deficiency for patient on BID zinc supplementation.   Per notes: B-cell lymphoma, FTT, high output ostomy with associated dehydration, TPN initiated 5/29 and anticipation of bowel dysfunction for weeks-months, debility and deconditioning. Plan is for SNF and to continue TPN there.     Medications reviewed; sliding scale novolog, 4 g IV Mg sulfate x1 run 6/3, 220 mg oral zinc sulfate BID.  Labs reviewed; CBGs: 138, 166, and 149 mg/dl today, Na: 133 mmol/l, creatinine: 0.38 mg/dl, Ca: 8.1 mg/dl.      Diet Order:   Diet Order            Diet regular Room service appropriate? Yes; Fluid consistency: Thin  Diet effective now              EDUCATION NEEDS:   Not appropriate for education at this time  Skin:  Skin Assessment: Skin Integrity Issues: Skin Integrity Issues:: Stage II Stage II: sacrum (new: 5/30)  Last BM:  6/3 (325 ml via colostomy)  Height:   Ht Readings from Last 1 Encounters:  02/08/19 5' 9" (1.753 m)    Weight:   Wt Readings from Last 1 Encounters:  02/08/19 69 kg    Ideal Body Weight:  72.7 kg  BMI:  Body mass index is 22.46 kg/m.  Estimated Nutritional Needs:  Kcal:  2300-2500 kcal  Protein:  115-125 grams  Fluid:  >/= 2.2 L/day     Jarome Matin, MS, RD, LDN, [facility] Inpatient Clinical Dietitian Pager # [phone] After hours/weekend pager # [phone]

## 2019-02-14 NOTE — Telephone Encounter (Signed)
Patient wife called in wanting to speak with the nurse regarding her husband in the hospital right now. She stated she was trying to get a message to the doctor that is providing him care while he was there in the hospital. She is wanting a call back from the nurse. [phone]

## 2019-02-14 NOTE — Consult Note (Signed)
   [facility] Inpatient Consult   02/14/2019  Mark Frey 10/21/1955 [id number]    Patient chart has been reviewed for readmissions less than 30 days and for extreme risk score, 49%, for unplanned readmissions.  Patient assessed for community [place] Management follow up needs.    Chart review reveals current disposition plan is for SNF.  No [facility] CM identifiable needs at this time.  Netta Cedars, MSN, [facility] Liaison Nurse Mobile Phone [phone]  Toll free office [phone]

## 2019-02-14 NOTE — Progress Notes (Signed)
Patient sitting up in chair. Pad underneath patient is soiled. Patient refused to be assisted with standing while patient cleaned up and pad changed. Will continue to encourage patient to participate in care and educate on the importance of hygiene.

## 2019-02-14 NOTE — Progress Notes (Signed)
Physical Therapy Treatment Patient Details Name: Mark Frey MRN: [id number] DOB: 06-20-1956 Today's Date: 02/14/2019    History of Present Illness 63 y.o. 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  General bed mobility comments: assist upper body, use of rail and HOB elevated .  General transfer comment: cues for hand placement; cues for initial positioning into standing.  General Gait Details: slow, sluggish gait with mild tremors throughout.  Max c/o feels "tired". Positioned in recliner to comfort.  Pt requested a GingerAle. Pt progressing slowly and will need ST Rehab at SNF to regain prior level of safe mobility.   Follow Up Recommendations  SNF;Supervision/Assistance - 24 hour     Equipment Recommendations       Recommendations for Other Services       Precautions / Restrictions Precautions Precautions: Fall Precaution Comments: ileostomy Restrictions Weight Bearing Restrictions: No    Mobility  Bed Mobility Overal bed mobility: Needs Assistance Bed Mobility: Supine to Sit     Supine to sit: Min assist     General bed mobility comments: assist upper body, use of rail and HOB elevated   Transfers Overall transfer level: Needs assistance Equipment used: Rolling walker (2 wheeled) Transfers: Sit to/from [facility] Sit to Stand: Min assist         General transfer comment: cues for hand placement; cues for initial positioning into standing  Ambulation/Gait Ambulation/Gait assistance: Min assist;+2 safety/equipment(recliner following is helpful) Gait Distance (Feet): 110 Feet Assistive device: Rolling walker (2 wheeled) Gait Pattern/deviations: Step-through pattern;Decreased step length - right;Decreased step  length - left;Shuffle;Trunk flexed Gait velocity: decreased    General Gait Details: slow, sluggish gait with mild tremors throughout.  Max c/o feels "tired".    Stairs             Wheelchair Mobility    Modified Rankin (Stroke Patients Only)       Balance                                            Cognition Arousal/Alertness: Awake/alert Behavior During Therapy: WFL for tasks assessed/performed Overall Cognitive Status: Within Functional Limits for tasks assessed                                 General Comments: quiet today, feels "tired"       Exercises      General Comments        Pertinent Vitals/Pain Pain Assessment: Faces Faces Pain Scale: Hurts little more Pain Location: ABD LLQ Pain Descriptors / Indicators: Discomfort;Grimacing Pain Intervention(s): Monitored during session;Repositioned    Home Living                      Prior Function            PT Goals (current goals can now be found in the care plan section) Progress towards PT goals: Progressing toward goals    Frequency    Min 2X/week      PT Plan Current plan remains appropriate    Co-evaluation  AM-PAC PT "6 Clicks" Mobility   Outcome Measure  Help needed turning from your back to your side while in a flat bed without using bedrails?: A Little Help needed moving from lying on your back to sitting on the side of a flat bed without using bedrails?: A Little Help needed moving to and from a bed to a chair (including a wheelchair)?: A Little Help needed standing up from a chair using your arms (e.g., wheelchair or bedside chair)?: A Little Help needed to walk in hospital room?: A Little Help needed climbing 3-5 steps with a railing? : A Lot 6 Click Score: 17    End of Session Equipment Utilized During Treatment: Gait belt Activity Tolerance: Patient limited by fatigue Patient left: in chair;with call bell/phone  within reach;with nursing/sitter in room Nurse Communication: Mobility status PT Visit Diagnosis: Muscle weakness (generalized) (M62.81)     Time: 1315-1340 PT Time Calculation (min) (ACUTE ONLY): 25 min  Charges:  $Gait Training: 8-22 mins $Therapeutic Activity: 8-22 mins                     Rica Koyanagi  PTA Acute  Rehabilitation Services Pager      [phone] Office      [phone]

## 2019-02-14 NOTE — Consult Note (Addendum)
Referring Provider:  [facility]         Primary Care Physician:  [name], MD Primary Gastroenterologist:   Owens Loffler, MD          Reason for Consultation:  Management of colitis          ASSESSMENT / PLAN:    71. 63 yo male with Burkitt's lymphoma extensively involving GI tract. He is s/p chemo/radiation (good response) but came to loop ileostomy December 2019 after presenting with bowel obstruction due to long segment of sigmoid stenosis.   2.  Hx of severe pancolitis in April 2020 felt to be either diversion colitis or radiation induced colitis. Infectious etiology was excluded. Apparently symptoms responded to short chain fatty acid enemas but they were stopped due to neutropenia. Bleeding doesn't seems to be an issue at present but concerned his ostomy output is high, the output consistency is definitely more watery than usual.  -He just came back from CTscan. Will see what that shows.   -Looks like he was discharged home on cholestyramine TID, not sure why not listed on home med list. Assuming there were no issues with the medication I will resume Prevalite TID -Depending on clinical course will consider trying short chain fatty acid enemas again as neutropenia has improved   3. Anemia, multifactorial. Hematology / Oncology agrees.  There doesn't seem to be any recent overt GI bleeding.   -hgb 9 today, s/p 3uPRBC so far this admission      3. Malnutrition, getting TNA                                                                   HPI:   Mark Frey is a 63 y.o. male with a history of Burkitt's lymphoma diagnosed 2019.  There was stents of involvement with a GI tract including multiple distal small bowel loops, ileocecal region and part of the sigmoid.  There was a good response to chemotherapy / radiation until around December 2019 when patient presented with a bowel obstruction.  Along the scopic evaluation demonstrated a 10 cm long sigmoid stenosis.   Patient underwent a loop ileostomy by Dr. Redmond Pulling.   In April of this year Mark Frey was admitted with rectal bleeding. CT scan suggested pancolitis. Infectious workup was negative. Colitis felt to be either radiation related or diversion colitis. He apparently had a good response to short chain fatty acid enemas but these were stopped due to neutropenia. Eventually put on cholestyramine to reduce ostomy output.  He was discharged home on 01/16/2019.  Mark Frey had a tele-visit appointment with our office 01/31/2019.  At that time his stools had become more solid (pudding consistency).  He was still struggling with some mucoid discharge from rectum as well as diminished appetite.  Recommended milkshakes and Carnation breakfast in addition to his regular diet  Mark Frey presented to the ED 02/07/2019 with complaints of progressive weakness over the preceding 2 weeks.[name] He reported high ostomy output and poor appetite.  On admission he was mildly confused with electrolyte disturbances / metabolic acidosis. Hgb was 7, about a gram down from what it has been. He got a unit of PRBC on 5/28, 5/29, and another unit yesterday. Today hgb is up to 9. Oncology  has been following inpatient.   Patient gives a history of chronic, nearly constant diffuse lower abdominal pain unrelated to eating or anything else. He can't characterize the pain other than it is just always there unless he takes Morphine.  Mark Frey feels like his ostomy output is about what it was at home prior to admission though at that time it was described to [place] as being of pudding-like consistency and now it is clearly more watery.  There is no blood in the ostomy bag.  He does endorse ongoing mucoid discharge from rectum.  He is unsure if cholestyramine was continued at home following discharge in early May  Past Medical History:  Diagnosis Date  . ALLERGIC RHINITIS   . Cancer ([place])    Lymphoma   . Diabetes mellitus   . Hyperlipidemia     Past Surgical History:   Procedure Laterality Date  . BIOPSY  03/15/2018   Procedure: BIOPSY;  Surgeon: Milus Banister, MD;  Location: [name];  Service: Endoscopy;;  . BOWEL RESECTION N/A 08/25/2018   Procedure: LAPROSCOPIC LOOP ILEOSTOMY;  Surgeon: Greer Pickerel, MD;  Location: [facility];  Service: General;  Laterality: N/A;  . CLEFT PALATE REPAIR    . COLONOSCOPY N/A 03/15/2018   Procedure: COLONOSCOPY;  Surgeon: Milus Banister, MD;  Location: [facility];  Service: Endoscopy;  Laterality: N/A;  . COLONOSCOPY N/A 08/20/2018   Procedure: COLONOSCOPY;  Surgeon: Irene Shipper, MD;  Location: [facility];  Service: Endoscopy;  Laterality: N/A;  . deviated septum repair     slight improvement  . ESOPHAGOGASTRODUODENOSCOPY N/A 03/15/2018   Procedure: ESOPHAGOGASTRODUODENOSCOPY (EGD);  Surgeon: Milus Banister, MD;  Location: [name];  Service: Endoscopy;  Laterality: N/A;  . IR IMAGING GUIDED PORT INSERTION  03/17/2018  . LAPAROSCOPY N/A 08/25/2018   Procedure: LAPAROSCOPY DIAGNOSTIC;  Surgeon: Greer Pickerel, MD;  Location: [facility];  Service: General;  Laterality: N/A;  . TONSILLECTOMY      Prior to Admission medications   Medication Sig Start Date End Date Taking? Authorizing Provider  apixaban (ELIQUIS) 5 MG TABS tablet DO NOT TAKE UNTIL SEEN BY YOUR ONCOLOGIST Patient taking differently: Take 2.5 mg by mouth 2 (two) times daily. DO NOT TAKE UNTIL SEEN BY YOUR ONCOLOGIST 01/16/19  Yes Oretha Milch D, MD  B Complex-C (B-COMPLEX WITH VITAMIN C) tablet Take 1 tablet by mouth daily for 30 days. 01/17/19 02/16/19 Yes Oretha Milch D, MD  dexamethasone (DECADRON) 4 MG tablet Take 1 tablet (4 mg total) by mouth daily with breakfast. 01/17/19  Yes Oretha Milch D, MD  dronabinol (MARINOL) 5 MG capsule Take 1 capsule (5 mg total) by mouth 2 (two) times daily before lunch and supper. 01/16/19  Yes Oretha Milch D, MD  Magnesium 500 MG CAPS Take 1 capsule (500 mg total) by mouth 2 (two) times daily. Patient taking differently:  Take 500 mg by mouth 2 (two) times a day.  06/27/18  Yes [name], MD  ondansetron (ZOFRAN) 4 MG tablet Take 1 tablet (4 mg total) by mouth every 6 (six) hours as needed for nausea. 01/16/19  Yes Oretha Milch D, MD  zinc sulfate 220 (50 Zn) MG capsule Take 1 capsule (220 mg total) by mouth 2 (two) times daily for 30 days. 01/16/19 02/15/19 Yes Oretha Milch D, MD  Amino Acids-Protein Hydrolys (FEEDING SUPPLEMENT, PRO-STAT SUGAR FREE 64,) LIQD Take 30 mLs by mouth 2 (two) times daily. Patient not taking: Reported on 02/07/2019 01/16/19   Oretha Milch  D, MD  blood glucose meter kit and supplies Dispense based on patient and insurance preference. Use daily as directed. (E11.9). 03/31/18   [name], MD  feeding supplement, ENSURE ENLIVE, (ENSURE ENLIVE) LIQD Take 237 mLs by mouth 2 (two) times daily between meals. Patient not taking: Reported on 02/07/2019 01/16/19   Oretha Milch D, MD  glucose blood (FREESTYLE TEST STRIPS) test strip Use to check blood sugar daily 03/30/18   [name], MD    Current Facility-Administered Medications  Medication Dose Route Frequency Provider Last Rate Last Dose  . acetaminophen (TYLENOL) tablet 650 mg  650 mg Oral Q6H PRN Mariel Aloe, MD       Or  . acetaminophen (TYLENOL) suppository 650 mg  650 mg Rectal Q6H PRN Mariel Aloe, MD      . apixaban (ELIQUIS) tablet 5 mg  5 mg Oral BID [name], MD   5 mg at 02/14/19 0857  . citalopram (CELEXA) tablet 10 mg  10 mg Oral Daily [name], MD   10 mg at 02/14/19 0857  . dexamethasone (DECADRON) tablet 1 mg  1 mg Oral Q breakfast [name], MD   1 mg at 02/14/19 0856  . heparin lock flush 100 unit/mL  500 Units Intracatheter Daily PRN [name], MD      . heparin lock flush 100 unit/mL  250 Units Intracatheter PRN [name], MD      . HYDROcodone-acetaminophen (NORCO/VICODIN) 5-325 MG per tablet 1-2 tablet  1-2 tablet Oral Q4H PRN Mariel Aloe, MD   2 tablet at 02/14/19 0856  . insulin aspart (novoLOG) injection 0-15 Units  0-15 Units Subcutaneous Q4H Adrian Saran, [facility]   2 Units at 02/14/19 7741  . magnesium sulfate IVPB 4 g 100 mL  4 g Intravenous Once Leodis Sias T, RPH      . ondansetron [facility]) tablet 4 mg  4 mg Oral Q6H PRN Mariel Aloe, MD       Or  . ondansetron (ZOFRAN) injection 4 mg  4 mg Intravenous Q6H PRN Mariel Aloe, MD      . oxandrolone Felipa Evener) tablet 5 mg  5 mg Oral BID [name], MD   5 mg at 02/14/19 0856  . sodium chloride (PF) 0.9 % injection           . sodium chloride flush (NS) 0.9 % injection 10 mL  10 mL Intracatheter PRN [name], MD      . sodium chloride flush (NS) 0.9 % injection 10-40 mL  10-40 mL Intracatheter PRN Mariel Aloe, MD   10 mL at 02/12/19 1700  . TPN ADULT (ION)   Intravenous Continuous TPN Eudelia Bunch, RPH 95 mL/hr at 02/14/19 0400    . zinc sulfate capsule 220 mg  220 mg Oral BID [name], MD   220 mg at 02/14/19 2878    Allergies as of 02/07/2019 - Review Complete 02/07/2019  Allergen Reaction Noted  . Ciprofloxacin Other (See Comments) 01/30/2018    Family History  Problem Relation Age of Onset  . Lung cancer Mother        smoker  . Brain cancer Mother        metastasis  . AAA (abdominal aortic aneurysm) Father        smoker    Social History   Socioeconomic History  . Marital status: Married    Spouse name:  lisa  . Number of children: 0  . Years of education: Not on file  . Highest education level: Not on file  Occupational History  . Not on file  Social Needs  . Financial resource strain: Not hard at all  . Food insecurity:    Worry: Never true    Inability: Never true  . Transportation needs:    Medical: No    Non-medical: No  Tobacco Use  . Smoking status: Never Smoker  . Smokeless tobacco: Never Used  Substance and Sexual Activity  . Alcohol use: Yes    Comment: occasional  . Drug  use: No  . Sexual activity: Yes  Lifestyle  . Physical activity:    Days per week: 0 days    Minutes per session: 0 min  . Stress: Not at all  Relationships  . Social connections:    Talks on phone: More than three times a week    Gets together: More than three times a week    Attends religious service: 1 to 4 times per year    Active member of club or organization: No    Attends meetings of clubs or organizations: Never    Relationship status: Married  . Intimate partner violence:    Fear of current or ex partner: No    Emotionally abused: No    Physically abused: No    Forced sexual activity: No  Other Topics Concern  . Not on file  Social History Narrative   Married 1985. No kids. 4 small dogs.       Works in Financial trader, residential      Hobbies: work on cars, Haematologist, exercise as able    Review of Systems: All systems reviewed and negative except where noted in HPI.  Physical Exam: Vital signs in last 24 hours: Temp:  [97.5 F (36.4 C)-98.8 F (37.1 C)] 98.8 F (37.1 C) (06/03 0511) Pulse Rate:  [83-94] 94 (06/03 0511) Resp:  [18] 18 (06/02 2222) BP: (94-112)/(56-65) 112/65 (06/03 0511) SpO2:  [98 %-100 %] 98 % (06/03 0511) Last BM Date: 02/13/19(colostomy) General:   Alert, well-developed,  male in NAD Psych:  Pleasant, cooperative. . Eyes:  Pupils equal, sclera clear, no icterus.   Conjunctiva pink. Ears:  Normal auditory acuity. Nose:  No deformity, discharge,  or lesions. Neck:  Supple; no masses Lungs:  Clear throughout to auscultation.   No wheezes, crackles, or rhonchi.  Heart:  Regular rate and rhythm; trace BLE edema Abdomen:  Soft, non-distended, nontender, BS active. Large amount of watery green liquid in ileostomy bag. No visible blood    Rectal:  Deferred  Msk:  Symmetrical without gross deformities. . Neurologic:  Alert and  oriented x4;  grossly normal neurologically. Skin:  Intact without significant lesions or rashes.    Intake/Output from previous day: 06/02 0701 - 06/03 0700 In: 2283.9 [I.V.:1968.9; Blood:315] Out: 1125 [Urine:300; Stool:825] Intake/Output this shift: Total I/O In: -  Out: 150 [Stool:150]  Lab Results: Recent Labs    02/12/19 0415 02/13/19 0546 02/14/19 0947  WBC 3.2* 2.5* 3.0*  HGB 8.2* 7.7* 9.0*  HCT 26.1* 25.0* 29.1*  PLT 89* 90* 85*   BMET Recent Labs    02/12/19 0415 02/13/19 0546 02/14/19 0947  NA 133* 136 133*  K 4.0 4.4 4.0  CL 100 101 101  CO2 26 27 24   GLUCOSE 129* 191* 197*  BUN 15 15 18   CREATININE 0.31* 0.34* 0.38*  CALCIUM 8.2* 8.2* 8.1*  LFT Recent Labs    02/13/19 0546  PROT 4.9*  ALBUMIN 1.8*  AST 22  ALT 38  ALKPHOS 101  BILITOT 0.3   PT/INR No results for input(s): LABPROT, INR in the last 72 hours. Hepatitis Panel No results for input(s): HEPBSAG, HCVAB, HEPAIGM, HEPBIGM in the last 72 hours.    Studies/Results: No results found.   Tye Savoy, NP-C @  02/14/2019, 11:47 AM

## 2019-02-14 NOTE — Consult Note (Signed)
[facility] Surgery Consult Note  Mark Frey 1956/02/16  [id number].    Requesting MD: Dessie Coma Chief Complaint:  weakness Reason for Consult: colitis with fluid collections   HPI: Patient is a 63 year old male with a history of Burkitt's lymphoma.  He has undergone radiation and chemotherapy.    Patient presented in June 2019, with pulmonary emboli and DVTs.  Work-up at that time found her to have a large infiltrating right-sided intra-abdominal mass adjacent distal small bowel, part of the sigmoid colon retroperitoneum and right pelvic stenosis.  He underwent chemotherapy with good response.  He had several admissions for recurrent small bowel obstruction, along with areas in the distal sigmoid colon stricture.  After an extensive work-up and evaluation with PET scan, finding of severe malnutrition, he underwent a diagnostic laparoscopy and diverting loop ileostomy on 09/11/2018 by Dr. Greer Pickerel.  His diet was advanced, he was weaned off TNA, and discharged on 08/28/2018. He started undergoing radiation in January 2020 He was readmitted to the hospital on 01/01/2019 with diarrhea weakness. He was also some confusion.  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  He was discharged 01/16/2019 diagnosis fever and radiation colitis, treated with short chain fatty acid enemas and cholestyramine.  He underwent treatment for malnutrition with TNA and nutritional supplements as his diet improves.  He was readmitted 02/07/19, with a chief complaint: Weakness.  He was treated for dehydration, metabolic acidosis, confusion sinus tachycardia hyponatremia, hypokalemia, pancytopenia and diffuse pan colitis due to radiation therapy.  GI panel and C. difficile were negative CRP and sed rate were elevated.  He was placed on TNA for hydration and nutritional support.  He has been seen by palliative care and then made a DNR.  He underwent repeat CT of the  abdomen and pelvis with contrast today.  This again demonstrates diffuse wall thickening consistent with colitis.  There is small fluid-filled collection adjacent to the descending colon measuring 2 x 1.5 cm.  A slightly larger fluid collection is noted surrounding the sigmoid colon extending toward the cecum in the right hemipelvis.  This lies in it previously seen air-filled pocket likely representing a confluent abscess.  This 1 measured 8 cm in the greatest diameter and 2.6 cm in the AP.  More proximal small bowel is within limits the loop ileostomy was working well.  We are asked to see and evaluate.  He remains on Decadron.  Patient is currently therapeutic on Eliquis.  He is on Decadron, and has orders to start Zosyn today.  He is afebrile vital signs are stable.  Labs show a white count of 3000, hemoglobin 9, hematocrit of 29 a platelet count of 85,000 electrolytes are improved with sodium 133 a potassium of 4.0 creatinine is 0.38, glucose 197.  ROS: Review of Systems  Constitutional: Positive for weight loss (He is not sure how much weight he is lost.  He says he had good food at home but no appetite.  Here the food is not fit for his dog.).  HENT: Negative.   Eyes: Negative.   Respiratory: Negative.   Cardiovascular: Positive for leg swelling.  Gastrointestinal: Positive for abdominal pain, diarrhea, nausea and vomiting.  Genitourinary: Negative.   Musculoskeletal:       Generalized weakness and deconditioning.  Skin: Negative.   Neurological: Negative.   Endo/Heme/Allergies: Negative.   Psychiatric/Behavioral: Positive for depression and memory loss.       His memory is off, he did not remember  his ileostomy being 6 months ago,     Family History  Problem Relation Age of Onset  . Lung cancer Mother        smoker  . Brain cancer Mother        metastasis  . AAA (abdominal aortic aneurysm) Father        smoker    Past Medical History:  Diagnosis Date  . ALLERGIC RHINITIS    . Cancer ([place])    Lymphoma   . Diabetes mellitus   . Hyperlipidemia   Lives with his wife and dog  Past Surgical History:  Procedure Laterality Date  . BIOPSY  03/15/2018   Procedure: BIOPSY;  Surgeon: Milus Banister, MD;  Location: [name];  Service: Endoscopy;;  . BOWEL RESECTION N/A 08/25/2018   Procedure: LAPROSCOPIC LOOP ILEOSTOMY;  Surgeon: Greer Pickerel, MD;  Location: [facility];  Service: General;  Laterality: N/A;  . CLEFT PALATE REPAIR    . COLONOSCOPY N/A 03/15/2018   Procedure: COLONOSCOPY;  Surgeon: Milus Banister, MD;  Location: [facility];  Service: Endoscopy;  Laterality: N/A;  . COLONOSCOPY N/A 08/20/2018   Procedure: COLONOSCOPY;  Surgeon: Irene Shipper, MD;  Location: [facility];  Service: Endoscopy;  Laterality: N/A;  . deviated septum repair     slight improvement  . ESOPHAGOGASTRODUODENOSCOPY N/A 03/15/2018   Procedure: ESOPHAGOGASTRODUODENOSCOPY (EGD);  Surgeon: Milus Banister, MD;  Location: [name];  Service: Endoscopy;  Laterality: N/A;  . IR IMAGING GUIDED PORT INSERTION  03/17/2018  . LAPAROSCOPY N/A 08/25/2018   Procedure: LAPAROSCOPY DIAGNOSTIC;  Surgeon: Greer Pickerel, MD;  Location: [facility];  Service: General;  Laterality: N/A;  . TONSILLECTOMY      Social History:  reports that he has never smoked. He has never used smokeless tobacco. He reports current alcohol use. He reports that he does not use drugs.  Allergies:  Allergies  Allergen Reactions  . Ciprofloxacin Other (See Comments)    Leg tingling    Medications Prior to Admission  Medication Sig Dispense Refill  . apixaban (ELIQUIS) 5 MG TABS tablet DO NOT TAKE UNTIL SEEN BY YOUR ONCOLOGIST (Patient taking differently: Take 2.5 mg by mouth 2 (two) times daily. DO NOT TAKE UNTIL SEEN BY YOUR ONCOLOGIST) 60 tablet 3  . B Complex-C (B-COMPLEX WITH VITAMIN C) tablet Take 1 tablet by mouth daily for 30 days. 30 tablet 0  . dexamethasone (DECADRON) 4 MG tablet Take 1 tablet (4 mg total) by  mouth daily with breakfast. 30 tablet 0  . dronabinol (MARINOL) 5 MG capsule Take 1 capsule (5 mg total) by mouth 2 (two) times daily before lunch and supper. 60 capsule 0  . Magnesium 500 MG CAPS Take 1 capsule (500 mg total) by mouth 2 (two) times daily. (Patient taking differently: Take 500 mg by mouth 2 (two) times a day. ) 60 capsule 0  . ondansetron (ZOFRAN) 4 MG tablet Take 1 tablet (4 mg total) by mouth every 6 (six) hours as needed for nausea. 20 tablet 0  . zinc sulfate 220 (50 Zn) MG capsule Take 1 capsule (220 mg total) by mouth 2 (two) times daily for 30 days. 60 capsule 0  . Amino Acids-Protein Hydrolys (FEEDING SUPPLEMENT, PRO-STAT SUGAR FREE 64,) LIQD Take 30 mLs by mouth 2 (two) times daily. (Patient not taking: Reported on 02/07/2019) 887 mL 0  . blood glucose meter kit and supplies Dispense based on patient and insurance preference. Use daily as directed. (E11.9). 1 each 0  .  feeding supplement, ENSURE ENLIVE, (ENSURE ENLIVE) LIQD Take 237 mLs by mouth 2 (two) times daily between meals. (Patient not taking: Reported on 02/07/2019) 237 mL 12  . glucose blood (FREESTYLE TEST STRIPS) test strip Use to check blood sugar daily 100 each 4    Blood pressure 112/65, pulse 94, temperature 98.8 F (37.1 C), temperature source Oral, resp. rate 18, height 5' 9"  (1.753 m), weight 69 kg, SpO2 98 %. Physical Exam: Physical Exam Constitutional:      General: He is not in acute distress.    Appearance: He is ill-appearing. He is not toxic-appearing or diaphoretic.  HENT:     Head: Normocephalic and atraumatic.     Mouth/Throat:     Pharynx: Oropharynx is clear. No oropharyngeal exudate or posterior oropharyngeal erythema.  Eyes:     Comments: Pupils are equal  Neck:     Musculoskeletal: Normal range of motion and neck supple. No neck rigidity or muscular tenderness.     Vascular: No carotid bruit.  Cardiovascular:     Rate and Rhythm: Normal rate and regular rhythm.     Pulses: Normal  pulses.     Heart sounds: Normal heart sounds. No murmur.  Pulmonary:     Effort: Pulmonary effort is normal. No respiratory distress.     Breath sounds: Normal breath sounds. No stridor. No wheezing, rhonchi or rales.     Comments: Right sided port in use Chest:     Chest wall: No tenderness.  Abdominal:     General: Abdomen is flat. Bowel sounds are normal. There is no distension (LUQ more than LLQ.  no pain on the right.  ileostomy working well, green fluid, and some clear fluid from the ostomy).     Tenderness: There is abdominal tenderness. There is no right CVA tenderness, left CVA tenderness, guarding or rebound.  Musculoskeletal:        General: Swelling (trace swelling both ankles) present. No tenderness, deformity or signs of injury.  Lymphadenopathy:     Cervical: No cervical adenopathy.  Skin:    General: Skin is warm and dry.     Capillary Refill: Capillary refill takes less than 2 seconds.     Coloration: Skin is pale.  Neurological:     General: No focal deficit present.     Mental Status: He is alert and oriented to person, place, and time.     Cranial Nerves: No cranial nerve deficit.     Comments: Memory is not completely normal  Psychiatric:        Mood and Affect: Mood normal.        Behavior: Behavior normal.        Thought Content: Thought content normal.        Judgment: Judgment normal.     Results for orders placed or performed during the hospital encounter of 02/07/19 (from the past 48 hour(s))  Glucose, capillary     Status: Abnormal   Collection Time: 02/12/19  4:46 PM  Result Value Ref Range   Glucose-Capillary 202 (H) 70 - 99 mg/dL  Glucose, capillary     Status: Abnormal   Collection Time: 02/12/19  8:47 PM  Result Value Ref Range   Glucose-Capillary 266 (H) 70 - 99 mg/dL  Glucose, capillary     Status: Abnormal   Collection Time: 02/12/19 11:40 PM  Result Value Ref Range   Glucose-Capillary 271 (H) 70 - 99 mg/dL  Glucose, capillary      Status: Abnormal  Collection Time: 02/13/19  4:57 AM  Result Value Ref Range   Glucose-Capillary 167 (H) 70 - 99 mg/dL  CBC     Status: Abnormal   Collection Time: 02/13/19  5:46 AM  Result Value Ref Range   WBC 2.5 (L) 4.0 - 10.5 K/uL   RBC 2.54 (L) 4.22 - 5.81 MIL/uL   Hemoglobin 7.7 (L) 13.0 - 17.0 g/dL   HCT 25.0 (L) 39.0 - 52.0 %   MCV 98.4 80.0 - 100.0 fL   MCH 30.3 26.0 - 34.0 pg   MCHC 30.8 30.0 - 36.0 g/dL   RDW 19.2 (H) 11.5 - 15.5 %   Platelets 90 (L) 150 - 400 K/uL    Comment: Immature Platelet Fraction may be clinically indicated, consider ordering this additional test [id number] CONSISTENT WITH PREVIOUS RESULT    nRBC 2.4 (H) 0.0 - 0.2 %    Comment: Performed at [facility], [place] [address] [place], [place] [address]  Comprehensive metabolic panel     Status: Abnormal   Collection Time: 02/13/19  5:46 AM  Result Value Ref Range   Sodium 136 135 - 145 mmol/L   Potassium 4.4 3.5 - 5.1 mmol/L   Chloride 101 98 - 111 mmol/L   CO2 27 22 - 32 mmol/L   Glucose, Bld 191 (H) 70 - 99 mg/dL   BUN 15 8 - 23 mg/dL   Creatinine, Ser 0.34 (L) 0.61 - 1.24 mg/dL   Calcium 8.2 (L) 8.9 - 10.3 mg/dL   Total Protein 4.9 (L) 6.5 - 8.1 g/dL   Albumin 1.8 (L) 3.5 - 5.0 g/dL   AST 22 15 - 41 U/L   ALT 38 0 - 44 U/L   Alkaline Phosphatase 101 38 - 126 U/L   Total Bilirubin 0.3 0.3 - 1.2 mg/dL   GFR calc non Af Amer >60 >60 mL/min   GFR calc Af Amer >60 >60 mL/min   Anion gap 8 5 - 15    Comment: Performed at [facility], [place] [address], [place], [place] [address]  Magnesium     Status: None   Collection Time: 02/13/19  5:46 AM  Result Value Ref Range   Magnesium 1.8 1.7 - 2.4 mg/dL    Comment: Performed at [facility], [place] [address], [place], [place] [address]  Phosphorus     Status: None   Collection Time: 02/13/19  5:46 AM  Result Value Ref Range   Phosphorus 2.7 2.5 - 4.6 mg/dL    Comment: Performed at [facility], [place] [address], [place], [place] [address]  Differential     Status: Abnormal   Collection Time: 02/13/19  5:46 AM  Result Value Ref Range   Neutrophils Relative % 82 %   Neutro Abs 3.0 1.7 - 7.7 K/uL   Lymphocytes Relative 2 %   Lymphs Abs 0.1 (L) 0.7 - 4.0 K/uL   Monocytes Relative 8 %   Monocytes Absolute 0.3 0.1 - 1.0 K/uL   Eosinophils Relative 0 %   Eosinophils Absolute 0.0 0.0 - 0.5 K/uL   Basophils Relative 1 %   Basophils Absolute 0.1 0.0 - 0.1 K/uL   WBC Morphology MILD LEFT SHIFT (1-5% METAS, OCC MYELO, OCC BANDS)     Comment: TOXIC GRANULATION   Polychromasia PRESENT     Comment: Performed at [facility], [place] [address], [place], [place] [address]  Glucose, capillary     Status: Abnormal  Collection Time: 02/13/19  7:31 AM  Result Value Ref Range   Glucose-Capillary 167 (H) 70 - 99 mg/dL  Glucose, capillary     Status: Abnormal   Collection Time: 02/13/19 11:45 AM  Result Value Ref Range   Glucose-Capillary 208 (H) 70 - 99 mg/dL  Sedimentation rate     Status: Abnormal   Collection Time: 02/13/19 12:04 PM  Result Value Ref Range   Sed Rate 130 (H) 0 - 16 mm/hr    Comment: Performed at [facility], [place] [address], [place], [place] [address]  C-reactive protein     Status: Abnormal   Collection Time: 02/13/19 12:04 PM  Result Value Ref Range   CRP 14.7 (H) <1.0 mg/dL    Comment: Performed at [facility], [place] [address], [place], [place] [address]  Lactate dehydrogenase     Status: Abnormal   Collection Time: 02/13/19 12:04 PM  Result Value Ref Range   LDH 56 (L) 98 - 192 U/L    Comment: Performed at [facility], [place] [address] [place], [place] [address]  Type and screen [place]     Status: None   Collection Time: 02/13/19 12:04 PM  Result Value Ref Range   ABO/RH(D) O POS    Antibody Screen NEG    Sample Expiration 02/16/2019,2359     Unit Number [id number]    Blood Component Type RBC, LR IRR    Unit division 00    Status of Unit ISSUED,FINAL    Transfusion Status OK TO TRANSFUSE    Crossmatch Result      Compatible Performed at [facility], [place] [address], [place], [place] [address]   Prepare RBC     Status: None   Collection Time: 02/13/19 12:04 PM  Result Value Ref Range   Order Confirmation      ORDER PROCESSED BY BLOOD BANK Performed at [facility], [place] [address] [place], [place] [address]   Glucose, capillary     Status: Abnormal   Collection Time: 02/13/19  2:45 PM  Result Value Ref Range   Glucose-Capillary 320 (H) 70 - 99 mg/dL  Glucose, capillary     Status: Abnormal   Collection Time: 02/13/19  5:38 PM  Result Value Ref Range   Glucose-Capillary 165 (H) 70 - 99 mg/dL  Glucose, capillary     Status: Abnormal   Collection Time: 02/13/19  9:54 PM  Result Value Ref Range   Glucose-Capillary 128 (H) 70 - 99 mg/dL  Glucose, capillary     Status: Abnormal   Collection Time: 02/14/19 12:07 AM  Result Value Ref Range   Glucose-Capillary 138 (H) 70 - 99 mg/dL  Glucose, capillary     Status: Abnormal   Collection Time: 02/14/19  5:08 AM  Result Value Ref Range   Glucose-Capillary 166 (H) 70 - 99 mg/dL  Glucose, capillary     Status: Abnormal   Collection Time: 02/14/19  7:57 AM  Result Value Ref Range   Glucose-Capillary 149 (H) 70 - 99 mg/dL  CBC with Differential/Platelet     Status: Abnormal   Collection Time: 02/14/19  9:47 AM  Result Value Ref Range   WBC 3.0 (L) 4.0 - 10.5 K/uL   RBC 3.00 (L) 4.22 - 5.81 MIL/uL   Hemoglobin 9.0 (L) 13.0 - 17.0 g/dL   HCT 29.1 (L) 39.0 - 52.0 %   MCV 97.0 80.0 - 100.0 fL   MCH 30.0  26.0 - 34.0 pg   MCHC 30.9 30.0 - 36.0 g/dL   RDW 18.6 (H) 11.5 - 15.5 %   Platelets 85 (L) 150 - 400 K/uL    Comment: Immature Platelet Fraction may be clinically indicated, consider ordering this additional  test [id number] CONSISTENT WITH PREVIOUS RESULT    nRBC 0.0 0.0 - 0.2 %   Neutrophils Relative % 81 %   Neutro Abs 2.4 1.7 - 7.7 K/uL   Lymphocytes Relative 4 %   Lymphs Abs 0.1 (L) 0.7 - 4.0 K/uL   Monocytes Relative 10 %   Monocytes Absolute 0.3 0.1 - 1.0 K/uL   Eosinophils Relative 0 %   Eosinophils Absolute 0.0 0.0 - 0.5 K/uL   Basophils Relative 1 %   Basophils Absolute 0.0 0.0 - 0.1 K/uL   WBC Morphology TOXIC GRANULATION    Immature Granulocytes 4 %   Abs Immature Granulocytes 0.12 (H) 0.00 - 0.07 K/uL    Comment: Performed at [facility], [place] [address], [place], [place] [address]  Basic metabolic panel     Status: Abnormal   Collection Time: 02/14/19  9:47 AM  Result Value Ref Range   Sodium 133 (L) 135 - 145 mmol/L   Potassium 4.0 3.5 - 5.1 mmol/L   Chloride 101 98 - 111 mmol/L   CO2 24 22 - 32 mmol/L   Glucose, Bld 197 (H) 70 - 99 mg/dL   BUN 18 8 - 23 mg/dL   Creatinine, Ser 0.38 (L) 0.61 - 1.24 mg/dL   Calcium 8.1 (L) 8.9 - 10.3 mg/dL   GFR calc non Af Amer >60 >60 mL/min   GFR calc Af Amer >60 >60 mL/min   Anion gap 8 5 - 15    Comment: Performed at [facility], [place] [address] [place], [place] [address]  Magnesium     Status: Abnormal   Collection Time: 02/14/19  9:47 AM  Result Value Ref Range   Magnesium 1.6 (L) 1.7 - 2.4 mg/dL    Comment: Performed at [facility], [place] [address] [place], [place] [address]  Phosphorus     Status: None   Collection Time: 02/14/19  9:47 AM  Result Value Ref Range   Phosphorus 3.2 2.5 - 4.6 mg/dL    Comment: Performed at [facility], [place] [address], [place],  [address]  Glucose, capillary     Status: Abnormal   Collection Time: 02/14/19 12:44 PM  Result Value Ref Range   Glucose-Capillary 160 (H) 70 - 99 mg/dL   Ct Abdomen Pelvis W Contrast  Result Date: 02/14/2019 CLINICAL DATA:  Left-sided abdominal pain for several days EXAM: CT  ABDOMEN AND PELVIS WITH CONTRAST TECHNIQUE: Multidetector CT imaging of the abdomen and pelvis was performed using the standard protocol following bolus administration of intravenous contrast. CONTRAST:  161m OMNIPAQUE 300 COMPARISON:  01/01/2019 FINDINGS: Lower chest: Mild basilar atelectasis is noted. Hepatobiliary: Stable in appearance from the recent exam. Pancreas: Unremarkable. No pancreatic ductal dilatation or surrounding inflammatory changes. Spleen: Normal in size without focal abnormality. Adrenals/Urinary Tract: Adrenal glands are within normal limits. Scattered small cysts are noted bilaterally. A nonobstructing right renal stone is seen. The ureters are within normal limits. The bladder is partially compressed. Stomach/Bowel: The colon again demonstrates diffuse wall thickening consistent with colitis. There is a small fluid collection identified adjacent to the mid descending colon which measures approximately 2 x 1.5 cm in greatest dimension. A slightly larger fluid collection  is noted surrounding sigmoid colon and extending towards the cecum in the right hemipelvis. This lies in an area of previously seen air-filled pockets likely representing a confluent abscess. This measures at least 8 cm in greatest transverse dimension but measures only approximately 2.6 cm in AP dimension. More proximal small bowel is within normal limits. Loop ileostomy is noted in the right mid abdomen. Vascular/Lymphatic: Aortic atherosclerosis. No enlarged abdominal or pelvic lymph nodes. Reproductive: Prostate is unremarkable. Other: No abdominal wall hernia or abnormality. No abdominopelvic ascites. Musculoskeletal: No acute or significant osseous findings. IMPRESSION: Multifocal fluid collections along the course of the descending and sigmoid colon as described consistent with developing abscesses. The largest of these lies along the margin of the sigmoid colon as described above. These changes are new from prior exam.  Nonobstructing right renal stone stable from the prior exam. These results will be called to the ordering clinician or representative by the Radiologist Assistant, and communication documented in the PACS or zVision Dashboard. Electronically Signed   By: Inez Catalina M.D.   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 Tawanna Solo is starting antibiotics, IR is to see, but I doubt there are any drainable sites.  Agree with bowel rest, I think I would give him clears and clear supplements for nutrition.  Agree with TPN for hydration and nutrition for now.    Earnstine [facility] Surgery 02/14/2019, 1:53 PM Pager: [phone]

## 2019-02-14 NOTE — Progress Notes (Signed)
Pharmacy Antibiotic Note  Mark Frey is a 63 y.o. male admitted on 02/07/2019 with intra-abdominal infection.  Pharmacy has been consulted for piperacillin/tazobactam dosing.  Patient is currently on TPN for severe radiation colitis with failure of PO intake and recurrent hospitalization. Pharmacy consulted to dose zosyn for IAI.  Today, 02/14/19  WBC 3  SCr/CrCl WNL  Afebrile  Plan:  Piperacillin/tazobactam 3.375 g IV q8h EI  Renal function has been stable - pharmacy to sign off at this time and follow peripherally. Please reconsult if needed.  Height: 5' 9"  (175.3 cm) Weight: 152 lb 1.9 oz (69 kg) IBW/kg (Calculated) : 70.7  Temp (24hrs), Avg:98.3 F (36.8 C), Min:97.5 F (36.4 C), Max:98.8 F (37.1 C)  Recent Labs  Lab 02/10/19 0359 02/11/19 0336 02/12/19 0415 02/13/19 0546 02/14/19 0947  WBC 3.9* 3.4* 3.2* 2.5* 3.0*  CREATININE 0.47* 0.37* 0.31* 0.34* 0.38*    Estimated Creatinine Clearance: 93.4 mL/min (A) (by C-G formula based on SCr of 0.38 mg/dL (L)).    Allergies  Allergen Reactions  . Ciprofloxacin Other (See Comments)    Leg tingling    Antimicrobials this admission: Piperacillin/tazobactam 6/3 >>   Dose adjustments this admission:  Microbiology results: 5/27 BCx: NGF 5/27 UCx: NGF  5/27 COVID: Negative  Thank you for allowing pharmacy to be a part of this patient's care.  Lenis Noon, PharmD 02/14/2019 1:42 PM

## 2019-02-14 NOTE — Progress Notes (Addendum)
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  On his baseline, is able to ambulate independently with a walker but more recently he was unable to stand on his own. Patient has been started on TPN for failure to thrive.  Oncology was following.  Patient was complaining of persistent abdominal discomfort.  CT abdomen/pelvis done today showed Multifocal fluid collections along the course of the descending and sigmoid colon  consistent with developing abscesses.General surgery/IR consulted.  Assessment & Plan:   Active Problems:   Diabetes mellitus (HCC)   Burkitt lymphoma, lymph nodes of multiple sites ([place])   Hyponatremia   Malnutrition of moderate degree   Dehydration   Palliative care by specialist   Goals of care, counseling/discussion   Symptomatic anemia   Atrophy of muscle of multiple sites   Depression  Abdominal abscesses: Patient was complaining of persistent abdominal discomfort.  CT abdomen/pelvis done today showed Multifocal fluid collections along the course of the descending and sigmoid colon consistent with developing abscesses.General surgery/IR consulted.Started on zosyn. We will get blood cultures.Patient is not septic.  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  Oncology following.  Currently in remission  History of a small bowel obstruction/ileus: Status post diverting ileostomy on 08/25/2018.  He is having stool output from the ileostomy bag.  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  GI panel, C. difficile negative.  CMV, EBV negative.  Elevated ESR and CRP. GI consulted  today.  Pancytopenia: Multifactorial.  Hemoglobin this morning is 9.  He has been transfused with 3 units of  PRBCs so far.  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  Also has thrombocytopenia.  Failure to thrive/dehydration/electrolyte abnormalities: Due to low oral intake, high ostomy output.  Started on TPN on 02/09/2019.Expected bowel dysfunction for weeks to months.  Continue TPN.  Electrolytes being monitored and supplemented  Diabetes type 2: Continue sliding scale insulin.  CBGs controlled  History of right lower extremity DVT/bilateral PE: Was on  Eliquis.Now on hold for pending surgical intervention  Depression: Continue Celexa  Debility/deconditioning: Plan is to discharge to skilled nursing facility with TPN when medically stable.  He has been accepted at [facility] facility.        Nutrition Problem: Increased nutrient needs Etiology: chronic illness, cancer and cancer related treatments      DVT prophylaxis: Eliquis Code Status: DNR Family Communication: None present at bedside Disposition Plan: Skilled nursing facility after medical stabilization   Consultants: Hematology/oncology  Procedures: None  Antimicrobials:  Anti-infectives (From admission, onward)   None      Subjective: Patient seen and examined the bedside this morning.  Hemodynamically stable.  Afebrile.  Complains of soreness in his belly but no nausea or vomiting.  Tolerating diet.  Objective: Vitals:   02/13/19 1559 02/13/19 1826 02/13/19 2222 02/14/19 0511  BP: (!) 100/58 94/64 105/62 112/65  Pulse: 90 85 83 94  Resp: _0 Temp: (!) 97.5 F (36.4 C) 98.5 F (36.9 C) 98.8 F (37.1 C) 98.8 F (37.1 C)  TempSrc: Oral Oral Oral Oral  SpO2: 99% 99% 100% 98%  Weight:      Height:  Intake/Output Summary (Last 24 hours) at 02/14/2019 1338 Last data filed at 02/14/2019 1317 Gross per 24 hour  Intake 2283.93 ml  Output 1350  ml  Net 933.93 ml   Filed Weights   02/08/19 1726  Weight: 69 kg    Examination:  General exam:Not in distress,debelitated HEENT:PERRL,Oral mucosa moist, Ear/Nose normal on gross exam Respiratory system: Bilateral equal air entry, normal vesicular breath sounds, no wheezes or crackles  Cardiovascular system: S1 & S2 heard, RRR. No JVD, murmurs, rubs, gallops or clicks. No pedal edema.  Port-A-Cath on the right chest Gastrointestinal system: Abdomen is nondistended, soft .  Mild generalized tenderness.  Ileostomy with  stool in the ileostomy bag  Central nervous system: Alert and oriented. No focal neurological deficits. Extremities: No edema, no clubbing ,no cyanosis, distal peripheral pulses palpable. Skin: No rashes, lesions or ulcers,no icterus ,no pallor  Data Reviewed: I have personally reviewed following labs and imaging studies  CBC: Recent Labs  Lab 02/09/19 0412 02/10/19 0359 02/11/19 0336 02/12/19 0415 02/13/19 0546 02/14/19 0947  WBC 4.9 3.9* 3.4* 3.2* 2.5* 3.0*  NEUTROABS 4.2 3.3  --  2.7 3.0 2.4  HGB 8.8* 8.8* 8.0* 8.2* 7.7* 9.0*  HCT 28.0* 27.4* 26.0* 26.1* 25.0* 29.1*  MCV 94.0 93.2 95.9 96.7 98.4 97.0  PLT 108* 100* 93* 89* 90* 85*   Basic Metabolic Panel: Recent Labs  Lab 02/10/19 0359 02/11/19 0336 02/12/19 0415 02/13/19 0546 02/14/19 0947  NA 128* 131* 133* 136 133*  K 3.3* 4.1 4.0 4.4 4.0  CL 96* 100 100 101 101  CO2 _0 GLUCOSE 171* 205* 129* 191* 197*  BUN _1 CREATININE 0.47* 0.37* 0.31* 0.34* 0.38*  CALCIUM 8.1* 8.2* 8.2* 8.2* 8.1*  MG 1.8 2.0 1.6* 1.8 1.6*  PHOS 2.7 3.1 3.0 2.7 3.2   GFR: Estimated Creatinine Clearance: 93.4 mL/min (A) (by C-G formula based on SCr of 0.38 mg/dL (L)). Liver Function Tests: Recent Labs  Lab 02/09/19 0412 02/10/19 0359 02/11/19 0336 02/12/19 0415 02/13/19 0546  AST 13* 11* 11* 14* 22  ALT _2 36 38  ALKPHOS 114 96 89 107 101  BILITOT 0.8 0.5 0.3 0.4 0.3  PROT 5.2*  5.2* 4.9* 4.9* 4.9*  ALBUMIN 2.1* 2.0* 1.9* 1.8* 1.8*   No results for input(s): LIPASE, AMYLASE in the last 168 hours. No results for input(s): AMMONIA in the last 168 hours. Coagulation Profile: No results for input(s): INR, PROTIME in the last 168 hours. Cardiac Enzymes: No results for input(s): CKTOTAL, CKMB, CKMBINDEX, TROPONINI in the last 168 hours. BNP (last 3 results) No results for input(s): PROBNP in the last 8760 hours. HbA1C: No results for input(s): HGBA1C in the last 72 hours. CBG: Recent Labs  Lab 02/13/19 2154 02/14/19 0007 02/14/19 0508 02/14/19 0757 02/14/19 1244  GLUCAP 128* 138* 166* 149* 160*   Lipid Profile: Recent Labs    02/12/19 0415  TRIG 71   Thyroid Function Tests: No results for input(s): TSH, T4TOTAL, FREET4, T3FREE, THYROIDAB in the last 72 hours. Anemia Panel: No results for input(s): VITAMINB12, FOLATE, FERRITIN, TIBC, IRON, RETICCTPCT in the last 72 hours. Sepsis Labs: No results for input(s): PROCALCITON, LATICACIDVEN in the last 168 hours.  Recent Results (from the past 240 hour(s))  Blood culture (routine x 2)     Status: None   Collection Time: 02/07/19 10:14 AM  Result Value Ref Range Status   Specimen Description   Final  BLOOD LEFT ANTECUBITAL Performed at [facility] [address] [place], [place] [address]    Special Requests   Final    BOTTLES DRAWN AEROBIC AND ANAEROBIC Blood Culture adequate volume Performed at [facility] [address] [place], [place] [address]    Culture   Final    NO GROWTH 5 DAYS Performed at [facility], [place] [address], [place], [place] [address]    Report Status 02/12/2019 FINAL  Final  SARS Coronavirus 2 (CEPHEID - Performed in [facility] hospital lab), Hosp Order     Status: None   Collection Time: 02/07/19 10:14 AM  Result Value Ref Range Status   SARS Coronavirus 2 NEGATIVE NEGATIVE Final    Comment: (NOTE) If result is  NEGATIVE SARS-CoV-2 target nucleic acids are NOT DETECTED. The SARS-CoV-2 RNA is generally detectable in upper and lower  respiratory specimens during the acute phase of infection. The lowest  concentration of SARS-CoV-2 viral copies this assay can detect is 250  copies / mL. A negative result does not preclude SARS-CoV-2 infection  and should not be used as the sole basis for treatment or other  patient management decisions.  A negative result may occur with  improper specimen collection / handling, submission of specimen other  than nasopharyngeal swab, presence of viral mutation(s) within the  areas targeted by this assay, and inadequate number of viral copies  (<250 copies / mL). A negative result must be combined with clinical  observations, patient history, and epidemiological information. If result is POSITIVE SARS-CoV-2 target nucleic acids are DETECTED. The SARS-CoV-2 RNA is generally detectable in upper and lower  respiratory specimens dur ing the acute phase of infection.  Positive  results are indicative of active infection with SARS-CoV-2.  Clinical  correlation with patient history and other diagnostic information is  necessary to determine patient infection status.  Positive results do  not rule out bacterial infection or co-infection with other viruses. If result is PRESUMPTIVE POSTIVE SARS-CoV-2 nucleic acids MAY BE PRESENT.   A presumptive positive result was obtained on the submitted specimen  and confirmed on repeat testing.  While 2019 novel coronavirus  (SARS-CoV-2) nucleic acids may be present in the submitted sample  additional confirmatory testing may be necessary for epidemiological  and / or clinical management purposes  to differentiate between  SARS-CoV-2 and other Sarbecovirus currently known to infect humans.  If clinically indicated additional testing with an alternate test  methodology [phone]) is advised. The SARS-CoV-2 RNA is generally  detectable  in upper and lower respiratory sp ecimens during the acute  phase of infection. The expected result is Negative. Fact Sheet for Patients:  StrictlyIdeas.no Fact Sheet for Healthcare Providers: BankingDealers.co.za This test is not yet approved or cleared by the [place] FDA and has been authorized for detection and/or diagnosis of SARS-CoV-2 by FDA under an Emergency Use Authorization (EUA).  This EUA will remain in effect (meaning this test can be used) for the duration of the COVID-19 declaration under Section 564(b)(1) of the Act, 21 U.S.C. section 360bbb-3(b)(1), unless the authorization is terminated or revoked sooner. Performed at [facility], [place] [address] [place], [place] [address]   Blood culture (routine x 2)     Status: None   Collection Time: 02/07/19 11:03 AM  Result Value Ref Range Status   Specimen Description   Final    BLOOD LEFT HAND Performed at [facility] [address]  [place], [place] [address]    Special Requests   Final    BOTTLES DRAWN AEROBIC AND ANAEROBIC Blood Culture adequate volume Performed at [facility] [address], [place], [place] [address]    Culture   Final    NO GROWTH 5 DAYS Performed at [facility], [place] [address], [place], [place] [address]    Report Status 02/12/2019 FINAL  Final  Urine culture     Status: None   Collection Time: 02/07/19  4:52 PM  Result Value Ref Range Status   Specimen Description   Final    URINE, RANDOM Performed at [facility] [address], [place], [place] [address]    Special Requests   Final    NONE Performed at [facility], [place] [address], [place],  [address]    Culture   Final    NO GROWTH Performed at [facility], [place] [address], [place],  [address]    Report Status 02/08/2019 FINAL  Final          Radiology Studies: Ct Abdomen Pelvis W Contrast  Result Date: 02/14/2019 CLINICAL DATA:  Left-sided abdominal pain for several days EXAM: CT ABDOMEN AND PELVIS WITH CONTRAST TECHNIQUE: Multidetector CT imaging of the abdomen and pelvis was performed using the standard protocol following bolus administration of intravenous contrast. CONTRAST:  125m OMNIPAQUE 300 COMPARISON:  01/01/2019 FINDINGS: Lower chest: Mild basilar atelectasis is noted. Hepatobiliary: Stable in appearance from the recent exam. Pancreas: Unremarkable. No pancreatic ductal dilatation or surrounding inflammatory changes. Spleen: Normal in size without focal abnormality. Adrenals/Urinary Tract: Adrenal glands are within normal limits. Scattered small cysts are noted bilaterally. A nonobstructing right renal stone is seen. The ureters are within normal limits. The bladder is partially compressed. Stomach/Bowel: The colon again demonstrates diffuse wall thickening consistent with colitis. There is a small fluid collection identified adjacent to the mid descending colon which measures approximately 2 x 1.5 cm in greatest dimension. A slightly larger fluid collection is noted surrounding sigmoid colon and extending towards the cecum in the right hemipelvis. This lies in an area of previously seen air-filled pockets likely representing a confluent abscess. This measures at least 8 cm in greatest transverse dimension but measures only approximately 2.6 cm in AP dimension. More proximal small bowel is within normal limits. Loop ileostomy is noted in the right mid abdomen. Vascular/Lymphatic: Aortic atherosclerosis. No enlarged abdominal or pelvic lymph nodes. Reproductive: Prostate is unremarkable. Other: No abdominal wall hernia or abnormality. No abdominopelvic ascites. Musculoskeletal: No acute or significant osseous findings. IMPRESSION: Multifocal fluid collections along the course of the descending and sigmoid colon as described  consistent with developing abscesses. The largest of these lies along the margin of the sigmoid colon as described above. These changes are new from prior exam. Nonobstructing right renal stone stable from the prior exam. These results will be called to the ordering clinician or representative by the Radiologist Assistant, and communication documented in the PACS or zVision Dashboard. Electronically Signed   By: MInez CatalinaM.D.   On: 02/14/2019 12:48        Scheduled Meds:  cholestyramine light  4 g Oral 3 times per day   citalopram  10 mg Oral Daily   dexamethasone  1 mg Oral Q breakfast   insulin aspart  0-15 Units Subcutaneous Q4H   oxandrolone  5 mg Oral BID   zinc sulfate  220 mg Oral BID   Continuous Infusions:  magnesium sulfate bolus IVPB 4 g (02/14/19 1242)   TPN ADULT (ION) 95 mL/hr at 29/52/84 1324   TPN CYCLIC-ADULT (ION)       LOS: 6 days    Time spent: 35 mins.More than 50% of that time was spent in counseling and/or coordination of care.      [name], MD [facility] Pager [phone]  If 7PM-7AM, please contact night-coverage [email or web address] Password [email or web address] 02/14/2019, 1:38 PM

## 2019-02-15 ENCOUNTER — Inpatient Hospital Stay (HOSPITAL_COMMUNITY): Payer: BC Managed Care – PPO

## 2019-02-15 DIAGNOSIS — K651 Peritoneal abscess: Secondary | ICD-10-CM

## 2019-02-15 LAB — COMPREHENSIVE METABOLIC PANEL
ALT: 27 U/L (ref 0–44)
AST: 11 U/L — ABNORMAL LOW (ref 15–41)
Albumin: 1.9 g/dL — ABNORMAL LOW (ref 3.5–5.0)
Alkaline Phosphatase: 85 U/L (ref 38–126)
Anion gap: 6 (ref 5–15)
BUN: 19 mg/dL (ref 8–23)
CO2: 27 mmol/L (ref 22–32)
Calcium: 8.2 mg/dL — ABNORMAL LOW (ref 8.9–10.3)
Chloride: 105 mmol/L (ref 98–111)
Creatinine, Ser: 0.36 mg/dL — ABNORMAL LOW (ref 0.61–1.24)
GFR calc Af Amer: >60 mL/min (ref >60)
GFR calc non Af Amer: >60 mL/min (ref >60)
Glucose, Bld: 142 mg/dL — ABNORMAL HIGH (ref 70–99)
Potassium: 4.2 mmol/L (ref 3.5–5.1)
Sodium: 138 mmol/L (ref 135–145)
Total Bilirubin: 0.6 mg/dL (ref 0.3–1.2)
Total Protein: 5.2 g/dL — ABNORMAL LOW (ref 6.5–8.1)

## 2019-02-15 LAB — PHOSPHORUS: Phosphorus: 3.6 mg/dL (ref 2.5–4.6)

## 2019-02-15 LAB — GLUCOSE, CAPILLARY
Glucose-Capillary: 130 mg/dL — ABNORMAL HIGH (ref 70–99)
Glucose-Capillary: 139 mg/dL — ABNORMAL HIGH (ref 70–99)
Glucose-Capillary: 141 mg/dL — ABNORMAL HIGH (ref 70–99)
Glucose-Capillary: 146 mg/dL — ABNORMAL HIGH (ref 70–99)
Glucose-Capillary: 150 mg/dL — ABNORMAL HIGH (ref 70–99)
Glucose-Capillary: 94 mg/dL (ref 70–99)

## 2019-02-15 LAB — MAGNESIUM: Magnesium: 2.1 mg/dL (ref 1.7–2.4)

## 2019-02-15 MED ORDER — INSULIN ASPART 100 UNIT/ML ~~LOC~~ SOLN
0.0000 [IU] | SUBCUTANEOUS | Status: DC
  Administered 2019-02-15 – 2019-02-16 (×3): 2 [IU] via SUBCUTANEOUS

## 2019-02-15 MED ORDER — TRAVASOL 10 % IV SOLN
INTRAVENOUS | Status: AC
  Administered 2019-02-15: 18:00:00 via INTRAVENOUS
  Filled 2019-02-15: qty 1254

## 2019-02-15 MED ORDER — HYDROMORPHONE HCL 1 MG/ML IJ SOLN
0.5000 mg | INTRAMUSCULAR | Status: DC | PRN
  Administered 2019-02-15 – 2019-03-06 (×13): 0.5 mg via INTRAVENOUS
  Filled 2019-02-15 (×13): qty 0.5

## 2019-02-15 MED ORDER — MORPHINE SULFATE (PF) 2 MG/ML IV SOLN
2.0000 mg | INTRAVENOUS | Status: DC | PRN
  Administered 2019-02-15: 15:00:00 2 mg via INTRAVENOUS
  Filled 2019-02-15: qty 1

## 2019-02-15 MED ORDER — IOHEXOL 300 MG/ML  SOLN
100.0000 mL | Freq: Once | INTRAMUSCULAR | Status: AC | PRN
  Administered 2019-02-19: 100 mL via INTRAVENOUS

## 2019-02-15 NOTE — Progress Notes (Signed)
Subjective/Chief Complaint: Pt with no acute changes overnight Pt with abdominal pain con't ostomy output   Objective: Vital signs in last 24 hours: Temp:  [98.4 F (36.9 C)-99 F (37.2 C)] 99 F (37.2 C) (06/04 0429) Pulse Rate:  [90-108] 97 (06/04 0429) Resp:  [14-18] 18 (06/04 0429) BP: (105-122)/(66) 105/66 (06/04 0429) SpO2:  [97 %-98 %] 97 % (06/04 0429) Last BM Date: 02/13/19(colostomy)  Intake/Output from previous day: 06/03 0701 - 06/04 0700 In: 1175.4 [I.V.:1074.1; IV Piggyback:101.3] Out: 2250 [Urine:1225; Stool:1025] Intake/Output this shift: No intake/output data recorded.  Constitutional: No acute distress, conversant, appears states age. Eyes: Anicteric sclerae, moist conjunctiva, no lid lag Lungs: Clear to auscultation bilaterally, normal respiratory effort CV: regular rate and rhythm, no murmurs, no peripheral edema, pedal pulses 2+ GI: Soft, no masses or hepatosplenomegaly, tender to palpation LLQ, ostomy patent Skin: No rashes, palpation reveals normal turgor Psychiatric: appropriate judgment and insight, oriented to person, place, and time   Lab Results:  Recent Labs    02/13/19 0546 02/14/19 0947  WBC 2.5* 3.0*  HGB 7.7* 9.0*  HCT 25.0* 29.1*  PLT 90* 85*   BMET Recent Labs    02/13/19 0546 02/14/19 0947  NA 136 133*  K 4.4 4.0  CL 101 101  CO2 27 24  GLUCOSE 191* 197*  BUN 15 18  CREATININE 0.34* 0.38*  CALCIUM 8.2* 8.1*  Studies/Results: Ct Abdomen Pelvis W Contrast  Result Date: 02/14/2019 CLINICAL DATA:  Left-sided abdominal pain for several days EXAM: CT ABDOMEN AND PELVIS WITH CONTRAST TECHNIQUE: Multidetector CT imaging of the abdomen and pelvis was performed using the standard protocol following bolus administration of intravenous contrast. CONTRAST:  142m OMNIPAQUE 300 COMPARISON:  01/01/2019 FINDINGS: Lower chest: Mild basilar atelectasis is noted. Hepatobiliary: Stable in appearance from the recent exam. Pancreas:  Unremarkable. No pancreatic ductal dilatation or surrounding inflammatory changes. Spleen: Normal in size without focal abnormality. Adrenals/Urinary Tract: Adrenal glands are within normal limits. Scattered small cysts are noted bilaterally. A nonobstructing right renal stone is seen. The ureters are within normal limits. The bladder is partially compressed. Stomach/Bowel: The colon again demonstrates diffuse wall thickening consistent with colitis. There is a small fluid collection identified adjacent to the mid descending colon which measures approximately 2 x 1.5 cm in greatest dimension. A slightly larger fluid collection is noted surrounding sigmoid colon and extending towards the cecum in the right hemipelvis. This lies in an area of previously seen air-filled pockets likely representing a confluent abscess. This measures at least 8 cm in greatest transverse dimension but measures only approximately 2.6 cm in AP dimension. More proximal small bowel is within normal limits. Loop ileostomy is noted in the right mid abdomen. Vascular/Lymphatic: Aortic atherosclerosis. No enlarged abdominal or pelvic lymph nodes. Reproductive: Prostate is unremarkable. Other: No abdominal wall hernia or abnormality. No abdominopelvic ascites. Musculoskeletal: No acute or significant osseous findings. IMPRESSION: Multifocal fluid collections along the course of the descending and sigmoid colon as described consistent with developing abscesses. The largest of these lies along the margin of the sigmoid colon as described above. These changes are new from prior exam. Nonobstructing right renal stone stable from the prior exam. These results will be called to the ordering clinician or representative by the Radiologist Assistant, and communication documented in the PACS or zVision Dashboard. Electronically Signed   By: MInez CatalinaM.D.   On: 02/14/2019 12:48    Anti-infectives: Anti-infectives (From admission, onward)   Start  Dose/Rate Route Frequency Ordered Stop   02/14/19 1400  piperacillin-tazobactam (ZOSYN) IVPB 3.375 g     3.375 g 12.5 mL/hr over 240 Minutes Intravenous Every 8 hours 02/14/19 1342        Assessment/Plan: Hx PE/DVT - on Eliquis Dehydration Hyponatremia PCM - prealbumin 6.5>>7.7 - on TPN Palliative care - DNR Pancytopenia  FTT/ severe deconditioning   -  dehydration/electrolyte abnormalities  Type II diabetes Depression DNR   Descending and sigmoid colitis with fluid collections  -  IR doesn't feel it is drainable Hx of radiation colitis/proctitis 01/01/19 Burkitt's lymphoma with chemotherapy & radiation therapy - ongoing  FEN:  Con't NPO/TPN ID:  Zosyn 6/3 - has not started yet DVT:  SCD's added  Follow up:  TBD POC: Tieken,[name] Spouse [phone]  [phone]   Plan:  Con't with bowel rest.  Agree with TPN for hydration and nutrition for now.  Con't with abx   LOS: 7 days    Ralene Ok 02/15/2019

## 2019-02-15 NOTE — Progress Notes (Signed)
Patient has been NPO for bowel rest per surgery. Patient has been moaning and groaning in room complaining of 10 out of 10 pain. RN paged MD on call and an order for IV morphine was placed. Patient received 43m IV morphine and patient has had no relief. Patient is still complaining of 10 out of 10 pain. Paged MD on call. Will continue to monitor patient and help try to relieve pain.  YTimoteo Gaul RN

## 2019-02-15 NOTE — Progress Notes (Addendum)
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  On his baseline, is able to ambulate independently with a walker but more recently he was unable to stand on his own. Patient has been started on TPN for failure to thrive.  Oncology was following.  Patient was complaining of persistent abdominal discomfort.  CT abdomen/pelvis done today showed Multifocal fluid collections along the course of the descending and sigmoid colon  consistent with developing abscesses.General surgery/IR consulted.  Assessment & Plan:   Active Problems:   Diabetes mellitus (HCC)   Burkitt lymphoma, lymph nodes of multiple sites ([place])   Hyponatremia   Malnutrition of moderate degree   Dehydration   Palliative care by specialist   Goals of care, counseling/discussion   Symptomatic anemia   Atrophy of muscle of multiple sites   Depression   Abnormal CT scan, gastrointestinal tract   Intra-abdominal abscess (HCC)  Abdominal abscesses: Patient was complaining of persistent abdominal discomfort.  CT abdomen/pelvis done today showed Multifocal fluid collections along the course of the descending and sigmoid colon consistent with developing abscesses.General surgery/IR consulted.Started on zosyn. We will blood cultures.Patient is not septic.  Plan is to continue conservative management without any intervention.Bowel rest.  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  Oncology following.  Currently in remission  History of a small bowel obstruction/ileus: Status post diverting ileostomy on 08/25/2018.  He is having stool output from the ileostomy bag.  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  GI panel, C. difficile negative.  CMV, EBV were negative before .  Elevated ESR and CRP.GI following.New CMV DNA sent.  Continue cholestyramine.  Pancytopenia: Multifactorial.  Last Hb is 9.  He has been transfused with 3 units of  PRBCs so far.  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  Also has thrombocytopenia.  Failure to thrive/dehydration/electrolyte abnormalities: Due to low oral intake, high ostomy output.  Started on TPN on 02/09/2019.Expected bowel dysfunction for weeks to months.  Continue TPN.  Electrolytes being monitored and supplemented  Diabetes type 2: Continue sliding scale insulin.  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  Depression: Continue Celexa  Debility/deconditioning: Plan is to discharge to skilled nursing facility with TPN when medically stable.  He has been accepted at [place] facility.        Nutrition Problem: Increased nutrient needs Etiology: chronic illness, cancer and cancer related treatments      DVT prophylaxis: Eliquis Code Status: DNR Family Communication: call wife on phone Disposition Plan: Skilled nursing facility after medical stabilization   Consultants: Hematology/oncology  Procedures: None  Antimicrobials:  Anti-infectives (From admission, onward)   Start     Dose/Rate Route Frequency Ordered Stop   02/14/19 1400  piperacillin-tazobactam (ZOSYN) IVPB 3.375 g     3.375 g 12.5 mL/hr over 240 Minutes Intravenous Every 8 hours 02/14/19 1342        Subjective: Patient seen and examined the bedside this morning.  Hemodynamically stable.  Complains of abdominal discomfort unlike yesterday.  No nausea or vomiting.  Objective: Vitals:   02/14/19 0511 02/14/19 1403 02/14/19 2053 02/15/19 0429  BP: 112/65 106/66 122/66 105/66  Pulse:  94 (!) 108 90 97  Resp:  14 18 18   Temp: 98.8 F (37.1  C) 98.4 F (36.9 C) 98.6 F (37 C) 99 F (37.2 C)  TempSrc: Oral Oral Oral Oral  SpO2: 98% 98% 97% 97%  Weight:      Height:        Intake/Output Summary (Last 24 hours) at 02/15/2019 1258 Last data filed at 02/15/2019 1100 Gross per 24 hour  Intake 1175.37 ml  Output 2800 ml  Net -1624.63 ml   Filed Weights   02/08/19 1726  Weight: 69 kg    Examination:  General exam: Not in distress HEENT:PERRL,Oral mucosa moist, Ear/Nose normal on gross exam Respiratory system: Bilateral equal air entry, normal vesicular breath sounds, no wheezes or crackles  Cardiovascular system: S1 & S2 heard, RRR. No JVD, murmurs, rubs, gallops or clicks.  Port-A-Cath on the right chest Gastrointestinal system: Abdomen is nondistended, soft.  Ileostomy.  Tenderness on the left side of the abdomen.  No organomegaly or masses felt. Normal bowel sounds heard. Central nervous system: Alert and oriented. No focal neurological deficits. Extremities: No edema, no clubbing ,no cyanosis, distal peripheral pulses palpable. Skin: No rashes, lesions or ulcers,no icterus ,no pallor  Data Reviewed: I have personally reviewed following labs and imaging studies  CBC: Recent Labs  Lab 02/09/19 0412 02/10/19 0359 02/11/19 0336 02/12/19 0415 02/13/19 0546 02/14/19 0947  WBC 4.9 3.9* 3.4* 3.2* 2.5* 3.0*  NEUTROABS 4.2 3.3  --  2.7 3.0 2.4  HGB 8.8* 8.8* 8.0* 8.2* 7.7* 9.0*  HCT 28.0* 27.4* 26.0* 26.1* 25.0* 29.1*  MCV 94.0 93.2 95.9 96.7 98.4 97.0  PLT 108* 100* 93* 89* 90* 85*   Basic Metabolic Panel: Recent Labs  Lab 02/11/19 0336 02/12/19 0415 02/13/19 0546 02/14/19 0947 02/15/19 0753  NA 131* 133* 136 133* 138  K 4.1 4.0 4.4 4.0 4.2  CL 100 100 101 101 105  CO2 24 26 27 24 27   GLUCOSE 205* 129* 191* 197* 142*  BUN 14 15 15 18 19   CREATININE 0.37* 0.31* 0.34* 0.38* 0.36*  CALCIUM 8.2* 8.2* 8.2* 8.1* 8.2*  MG 2.0 1.6* 1.8 1.6* 2.1  PHOS 3.1 3.0 2.7 3.2 3.6   GFR: Estimated Creatinine  Clearance: 93.4 mL/min (A) (by C-G formula based on SCr of 0.36 mg/dL (L)). Liver Function Tests: Recent Labs  Lab 02/10/19 0359 02/11/19 0336 02/12/19 0415 02/13/19 0546 02/15/19 0753  AST 11* 11* 14* 22 11*  ALT 24 24 36 38 27  ALKPHOS 96 89 107 101 85  BILITOT 0.5 0.3 0.4 0.3 0.6  PROT 5.2* 4.9* 4.9* 4.9* 5.2*  ALBUMIN 2.0* 1.9* 1.8* 1.8* 1.9*   No results for input(s): LIPASE, AMYLASE in the last 168 hours. No results for input(s): AMMONIA in the last 168 hours. Coagulation Profile: No results for input(s): INR, PROTIME in the last 168 hours. Cardiac Enzymes: No results for input(s): CKTOTAL, CKMB, CKMBINDEX, TROPONINI in the last 168 hours. BNP (last 3 results) No results for input(s): PROBNP in the last 8760 hours. HbA1C: No results for input(s): HGBA1C in the last 72 hours. CBG: Recent Labs  Lab 02/14/19 2045 02/15/19 0023 02/15/19 0422 02/15/19 0729 02/15/19 1103  GLUCAP 156* 130* 141* 146* 139*   Lipid Profile: No results for input(s): CHOL, HDL, LDLCALC, TRIG, CHOLHDL, LDLDIRECT in the last 72 hours. Thyroid Function Tests: No results for input(s): TSH, T4TOTAL, FREET4, T3FREE, THYROIDAB in the last 72 hours. Anemia Panel: No results for input(s): VITAMINB12, FOLATE, FERRITIN,  TIBC, IRON, RETICCTPCT in the last 72 hours. Sepsis Labs: No results for input(s): PROCALCITON, LATICACIDVEN in the last 168 hours.  Recent Results (from the past 240 hour(s))  Blood culture (routine x 2)     Status: None   Collection Time: 02/07/19 10:14 AM  Result Value Ref Range Status   Specimen Description   Final    BLOOD LEFT ANTECUBITAL Performed at [facility] [address], [place], [place] [address]    Special Requests   Final    BOTTLES DRAWN AEROBIC AND ANAEROBIC Blood Culture adequate volume Performed at [facility] [address], [place], [place] [address]    Culture   Final    NO GROWTH 5 DAYS Performed at [facility], [place] [address] [place], [place] [address]    Report Status 02/12/2019 FINAL  Final  SARS Coronavirus 2 (CEPHEID - Performed in [place] [facility] hospital lab), Hosp Order     Status: None   Collection Time: 02/07/19 10:14 AM  Result Value Ref Range Status   SARS Coronavirus 2 NEGATIVE NEGATIVE Final    Comment: (NOTE) If result is NEGATIVE SARS-CoV-2 target nucleic acids are NOT DETECTED. The SARS-CoV-2 RNA is generally detectable in upper and lower  respiratory specimens during the acute phase of infection. The lowest  concentration of SARS-CoV-2 viral copies this assay can detect is 250  copies / mL. A negative result does not preclude SARS-CoV-2 infection  and should not be used as the sole basis for treatment or other  patient management decisions.  A negative result may occur with  improper specimen collection / handling, submission of specimen other  than nasopharyngeal swab, presence of viral mutation(s) within the  areas targeted by this assay, and inadequate number of viral copies  (<250 copies / mL). A negative result must be combined with clinical  observations, patient history, and epidemiological information. If result is POSITIVE SARS-CoV-2 target nucleic acids are DETECTED. The SARS-CoV-2 RNA is generally detectable in upper and lower  respiratory specimens dur ing the acute phase of infection.  Positive  results are indicative of active infection with SARS-CoV-2.  Clinical  correlation with patient history and other diagnostic information is  necessary to determine patient infection status.  Positive results do  not rule out bacterial infection or co-infection with other viruses. If result is PRESUMPTIVE POSTIVE SARS-CoV-2 nucleic acids MAY BE PRESENT.   A presumptive positive result was obtained on the submitted specimen  and confirmed on repeat testing.  While 2019 novel coronavirus  (SARS-CoV-2) nucleic acids may be present in the submitted sample  additional  confirmatory testing may be necessary for epidemiological  and / or clinical management purposes  to differentiate between  SARS-CoV-2 and other Sarbecovirus currently known to infect humans.  If clinically indicated additional testing with an alternate test  methodology [phone]) is advised. The SARS-CoV-2 RNA is generally  detectable in upper and lower respiratory sp ecimens during the acute  phase of infection. The expected result is Negative. Fact Sheet for Patients:  StrictlyIdeas.no Fact Sheet for Healthcare Providers: BankingDealers.co.za This test is not yet approved or cleared by the [place] FDA and has been authorized for detection and/or diagnosis of SARS-CoV-2 by FDA under an Emergency Use Authorization (EUA).  This EUA will remain in effect (meaning this test can be used) for the duration of the COVID-19 declaration under Section 564(b)(1) of the Act, 21 U.S.C. section 360bbb-3(b)(1), unless the authorization is terminated or  revoked sooner. Performed at [facility], [place] [address] [place], [place] [address]   Blood culture (routine x 2)     Status: None   Collection Time: 02/07/19 11:03 AM  Result Value Ref Range Status   Specimen Description   Final    BLOOD LEFT HAND Performed at [facility] [address], [place], [place] [address]    Special Requests   Final    BOTTLES DRAWN AEROBIC AND ANAEROBIC Blood Culture adequate volume Performed at [facility] [address] [place], [place] [address]    Culture   Final    NO GROWTH 5 DAYS Performed at [facility], [place] [address] [place], [place] [address]    Report Status 02/12/2019 FINAL  Final  Urine culture     Status: None   Collection Time: 02/07/19  4:52 PM  Result Value Ref Range Status   Specimen Description   Final    URINE, RANDOM Performed at [facility]  [address] [place], [place] [address]    Special Requests   Final    NONE Performed at [facility], [place] [address] [place], [place] [address]    Culture   Final    NO GROWTH Performed at [facility], [place] [address], [place], [place] [address]    Report Status 02/08/2019 FINAL  Final  Culture, blood (routine x 2)     Status: None (Preliminary result)   Collection Time: 02/14/19  2:01 PM  Result Value Ref Range Status   Specimen Description   Final    BLOOD RIGHT HAND Performed at [facility] [address], [place], [place] [address]    Special Requests   Final    BOTTLES DRAWN AEROBIC AND ANAEROBIC Blood Culture adequate volume Performed at [facility] [address] [place], [place] [address]    Culture   Final    NO GROWTH < 24 HOURS Performed at [facility] [address] [place], [place] [address]    Report Status PENDING  Incomplete  Culture, blood (routine x 2)     Status: None (Preliminary result)   Collection Time: 02/14/19  2:02 PM  Result Value Ref Range Status   Specimen Description   Final    BLOOD RIGHT FOREARM Performed at [facility] [address] [place], [place] [address]    Special Requests   Final    BOTTLES DRAWN AEROBIC AND ANAEROBIC Blood Culture adequate volume Performed at [facility] [address], [place], [place] [address]    Culture   Final    NO GROWTH < 24 HOURS Performed at [facility] [address], [place],  [address]    Report Status PENDING  Incomplete         Radiology Studies: Ct Abdomen Pelvis W Contrast  Result Date: 02/14/2019 CLINICAL DATA:  Left-sided abdominal pain for several days EXAM: CT ABDOMEN AND PELVIS WITH CONTRAST TECHNIQUE: Multidetector CT imaging of the abdomen and pelvis was performed using the standard protocol following bolus administration of intravenous contrast.  CONTRAST:  147m OMNIPAQUE 300 COMPARISON:  01/01/2019 FINDINGS: Lower chest: Mild basilar atelectasis is noted. Hepatobiliary: Stable in appearance from the recent exam. Pancreas: Unremarkable. No pancreatic ductal dilatation or surrounding inflammatory changes. Spleen: Normal in size without focal abnormality. Adrenals/Urinary Tract: Adrenal glands are within normal limits. Scattered small cysts are  noted bilaterally. A nonobstructing right renal stone is seen. The ureters are within normal limits. The bladder is partially compressed. Stomach/Bowel: The colon again demonstrates diffuse wall thickening consistent with colitis. There is a small fluid collection identified adjacent to the mid descending colon which measures approximately 2 x 1.5 cm in greatest dimension. A slightly larger fluid collection is noted surrounding sigmoid colon and extending towards the cecum in the right hemipelvis. This lies in an area of previously seen air-filled pockets likely representing a confluent abscess. This measures at least 8 cm in greatest transverse dimension but measures only approximately 2.6 cm in AP dimension. More proximal small bowel is within normal limits. Loop ileostomy is noted in the right mid abdomen. Vascular/Lymphatic: Aortic atherosclerosis. No enlarged abdominal or pelvic lymph nodes. Reproductive: Prostate is unremarkable. Other: No abdominal wall hernia or abnormality. No abdominopelvic ascites. Musculoskeletal: No acute or significant osseous findings. IMPRESSION: Multifocal fluid collections along the course of the descending and sigmoid colon as described consistent with developing abscesses. The largest of these lies along the margin of the sigmoid colon as described above. These changes are new from prior exam. Nonobstructing right renal stone stable from the prior exam. These results will be called to the ordering clinician or representative by the Radiologist Assistant, and communication documented  in the PACS or zVision Dashboard. Electronically Signed   By: Inez Catalina M.D.   On: 02/14/2019 12:48        Scheduled Meds: . apixaban  5 mg Oral BID  . cholestyramine light  4 g Oral 3 times per day  . citalopram  10 mg Oral Daily  . dexamethasone  1 mg Oral Q breakfast  . insulin aspart  0-15 Units Subcutaneous 4 times per day  . oxandrolone  5 mg Oral BID  . zinc sulfate  220 mg Oral BID   Continuous Infusions: . piperacillin-tazobactam (ZOSYN)  IV 3.375 g (02/15/19 0521)  . TPN CYCLIC-ADULT (ION)       LOS: 7 days    Time spent: 35 mins.More than 50% of that time was spent in counseling and/or coordination of care.      [name], MD [facility] Pager [phone]  If 7PM-7AM, please contact night-coverage [email or web address] Password [facility] 02/15/2019, 12:58 PM

## 2019-02-15 NOTE — Progress Notes (Signed)
Nutrition Follow-up  RD working remotely.   DOCUMENTATION CODES:   Non-severe (moderate) malnutrition in context of chronic illness  INTERVENTION:  - continue cyclic TPN per Pharmacy. - diet re-advancement as medically feasible. - weigh patient today.    NUTRITION DIAGNOSIS:   Increased nutrient needs related to chronic illness, cancer and cancer related treatments as evidenced by estimated needs. -ongoing  GOAL:   Patient will meet greater than or equal to 90% of their needs -met with TPN regimen  MONITOR:   Diet advancement, Labs, Weight trends, Skin, I & O's, Other (Comment)(TPN regimen)  ASSESSMENT:   63 y.o. male with medical history significant of Burkitt's lymphoma s/p radiation and chemotherapy, s/p ileostomy, Type 2 DM, radiation colitis, and PE/DVT on Eliquis. He presented to the ED with 3 week history of worsening weakness. At baseline, he is able to ambulate independently with a walker but more recently was unable to stand up on his own. The patient and his wife went to his PCP on 5/26 to discuss decline and to assess capacity. Patient, at that time was considering hospice referral, but has now opted for acute medical treatment.   Patient has not been weighed since admission (5/28). Diet was changed from Regular to NPO yesterday at 1:37 PM. Patient transitioned to cyclic TPN yesterday with TPN over 18 hours. Plan, per Pharmacy note, to decrease run time to 14 hours starting today. This regimen is providing 2270 kcal, 125 grams protein.  Per review of chart, patient has had 325 ml output from ileostomy so far today. He had 825 ml output on 6/2 and 1025 ml output on 6/3.   Per notes: patient has descending colon and sigmoid colitis with fluid collections which are felt to be undrainable. Plan is to continue TPN and bowel rest. Plan is for SNF at time of d/c and to continue TPN at SNF.    Medications reviewed; sliding scale novolog, 4 g IV Mg sulfate x1 run 6/3, 220 mg  zinc sulfate BID.  Labs reviewed; CBGs: 130-146 mg/dl today, creatinine: 1.36 mg/dl, Ca: 8.2 mg/dl.     Diet Order:   Diet Order            Diet NPO time specified  Diet effective now              EDUCATION NEEDS:   Not appropriate for education at this time  Skin:  Skin Assessment: Skin Integrity Issues: Skin Integrity Issues:: Stage II Stage II: sacrum (new: 5/30)  Last BM:  6/4 (325 ml via ileostomy)  Height:   Ht Readings from Last 1 Encounters:  02/08/19 5' 9"  (1.753 m)    Weight:   Wt Readings from Last 1 Encounters:  02/08/19 69 kg    Ideal Body Weight:  72.7 kg  BMI:  Body mass index is 22.46 kg/m.  Estimated Nutritional Needs:   Kcal:  2300-2500 kcal  Protein:  115-125 grams  Fluid:  >/= 2.2 L/day     Jarome Matin, MS, RD, LDN, [facility] Inpatient Clinical Dietitian Pager # [phone] After hours/weekend pager # [phone]

## 2019-02-15 NOTE — Progress Notes (Addendum)
HEMATOLOGY-ONCOLOGY PROGRESS NOTE  SUBJECTIVE: Reports ongoing abdominal pain. Still with liquid stool output from colostomy. GI panel and stool for c diff pending collection. No bleeding. Remains afebrile. Other VSS. No new complaints since yesterday.  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  Based on the IHC studies, the differential diagnosis includes diffuse large B-cell lymphoma, Burkitt lymphoma as well as but can like lymphoma.     03/15/2018 Initial Diagnosis    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[place])    03/15/2018 Procedure    Colonoscopy showed a large tumor in the proximal ascending colon, likely from the direct invasion of his abdominal lymphoma.  Biopsy confirmed high-grade B-cell lymphoma.    03/17/2018 -  Chemotherapy    R-EPOCH every 3 weeks    03/17/2018 Procedure    Bone marrow biopsy was negative for lymphoma involvement.    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 He received intrathecal methotrexate.     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       REVIEW OF SYSTEMS:   A comprehensive 14 point review of systems was negative except as noted in the HPI.  I have reviewed the past medical history, past surgical history, social history and family history with the patient and they are unchanged from previous note.   PHYSICAL EXAMINATION:  Vitals:   02/15/19 0429 02/15/19 1257  BP: 105/66 102/67  Pulse: 97 95  Resp: 18 16  Temp: 99 F (37.2 C) 99.4 F (37.4 C)  SpO2: 97% 98%   Filed Weights   02/08/19 1726  Weight: 152 lb 1.9 oz (69 kg)    Intake/Output from previous day: 06/03 0701 - 06/04 0700 In: 1175.4 [I.V.:1074.1; IV Piggyback:101.3] Out: 2250 [Urine:1225; Stool:1025]  GENERAL:alert, no distress and comfortable SKIN: No rashes or significant lesions. EYES: normal, Conjunctiva are pink and non-injected, sclera clear OROPHARYNX:no exudate, no erythema and lips, buccal mucosa, and tongue normal  NECK: supple, thyroid normal size, non-tender, without nodularity LYMPH:  no palpable lymphadenopathy in the cervical, axillary or inguinal LUNGS: clear to auscultation and percussion with normal breathing effort HEART: regular rate & rhythm and no murmurs and 1+ bilateral lower extremity edema. ABDOMEN: Soft, mild tenderness with light palpation.  Liquid stool noted in the colostomy.  Positive bowel sounds. Musculoskeletal:no cyanosis of digits  and no clubbing  NEURO: alert & oriented x 3,no focal motor/sensory deficits.  Flat affect.  LABORATORY DATA:  I have reviewed the data as listed CMP Latest Ref Rng & Units 02/15/2019 02/14/2019 02/13/2019  Glucose 70 - 99 mg/dL 142(H) 197(H) 191(H)  BUN 8 - 23 mg/dL 19 18 15   Creatinine 0.61 - 1.24 mg/dL 0.36(L) 0.38(L) 0.34(L)  Sodium 135 - 145 mmol/L 138 133(L) 136  Potassium 3.5 - 5.1 mmol/L  4.2 4.0 4.4  Chloride 98 - 111 mmol/L 105 101 101  CO2 22 - 32 mmol/L 27 24 27   Calcium 8.9 - 10.3 mg/dL 8.2(L) 8.1(L) 8.2(L)  Total Protein 6.5 - 8.1 g/dL 5.2(L) - 4.9(L)  Total Bilirubin 0.3 - 1.2 mg/dL 0.6 - 0.3  Alkaline Phos 38 - 126 U/L 85 - 101  AST 15 - 41 U/L 11(L) - 22  ALT 0 - 44 U/L 27 - 38    Lab Results  Component Value Date   WBC 3.0 (L) 02/14/2019   HGB 9.0 (L) 02/14/2019   HCT 29.1 (L) 02/14/2019   MCV 97.0 02/14/2019   PLT 85 (L) 02/14/2019   NEUTROABS 2.4 02/14/2019    Ct Abdomen Pelvis W Contrast  Result Date: 02/14/2019 CLINICAL DATA:  Left-sided abdominal pain for several days EXAM: CT ABDOMEN AND PELVIS WITH CONTRAST TECHNIQUE: Multidetector CT imaging of the abdomen and pelvis was performed using the standard protocol following bolus administration of intravenous contrast. CONTRAST:  155m OMNIPAQUE 300 COMPARISON:  01/01/2019 FINDINGS: Lower chest: Mild basilar atelectasis is noted. Hepatobiliary: Stable in appearance from the recent exam. Pancreas: Unremarkable. No pancreatic ductal dilatation or surrounding inflammatory changes. Spleen: Normal in size without focal abnormality. Adrenals/Urinary Tract: Adrenal glands are within normal limits. Scattered small cysts are noted bilaterally. A nonobstructing right renal stone is seen. The ureters are within normal limits. The bladder is partially compressed. Stomach/Bowel: The colon again demonstrates diffuse wall thickening consistent with colitis. There is a small fluid collection identified adjacent to the mid descending colon which measures approximately 2 x 1.5 cm in greatest dimension. A slightly larger fluid collection is noted surrounding sigmoid colon and extending towards the cecum in the right hemipelvis. This lies in an area of previously seen air-filled pockets likely representing a confluent abscess. This measures at least 8 cm in greatest transverse dimension but measures only approximately 2.6 cm in AP  dimension. More proximal small bowel is within normal limits. Loop ileostomy is noted in the right mid abdomen. Vascular/Lymphatic: Aortic atherosclerosis. No enlarged abdominal or pelvic lymph nodes. Reproductive: Prostate is unremarkable. Other: No abdominal wall hernia or abnormality. No abdominopelvic ascites. Musculoskeletal: No acute or significant osseous findings. IMPRESSION: Multifocal fluid collections along the course of the descending and sigmoid colon as described consistent with developing abscesses. The largest of these lies along the margin of the sigmoid colon as described above. These changes are new from prior exam. Nonobstructing right renal stone stable from the prior exam. These results will be called to the ordering clinician or representative by the Radiologist Assistant, and communication documented in the PACS or zVision Dashboard. Electronically Signed   By: MInez CatalinaM.D.   On: 02/14/2019 12:48    ASSESSMENT AND PLAN: 1. 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  3. H/o Small bowel obstruction/ileuswith sigmoid thickening/stenosis . 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 ErGreer Pickerel  4. 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 GI panel and C. difficile negative.  CMV IgM negative, EBV IgM negative. 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 Sed rate and CRP still elevated  5.Hypovolemic hyponatremia. Other electrolyte issues including hypokalemia and hypomagnesemia  6. Pancytopenia -  Anemia likely multifactorial but could be from GI bleeding plus significant inflammation from severe radiation colitis. Leukopenia/lymphopenia likely from radiation toxicity. Thrombocytopenia from consumption related to GI bleeding and  from radiation toxicity.- nearly normalized. B12 within normal limits Copper levels within normal limits at 132 Zinc deficiency noted- 44 rpt this admission 60 Viral work-upnot revealing Low likelihood of Burkitt's lymphoma progression at this time.  7. Admitted with failure to thrive, dehydration electrolyte abnormalities and hyperglycemia with poor p.o. intake. 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  8. Depression - discussed with patient. Continue Celexa and optimize dose to 34m in 1-2 weeks.  9. Symptomatic anemia -transfuse prn to maintain hgb>8 given symptoms and some GI losses.  10. Moderate-severe protein calorie malnutrition and muscle waste  11. Hypogonadism - secondary. Very low testosterone levels.  PLAN -Labs reviewed with the patient.  Anticipate some blood count changes due to hemodilution from TPN. CBC not checked today.  -Recommend PRBC transfusion for hemoglobin less than 8.0 due to ongoing GI blood losses.  Received 1 unit packed red blood cells on 02/13/2019.  Hemoglobin has improved to 9.0 following transfusion. Recheck tomorrow morning.  -optimize nutrition -Continue Oxandrolone to 564mpo BID -Continue Eliquis back to 62m34mo BID in the setting of improved plt counts no overt bleeding and some increased risk of VTE with decreased ambulation and adding oxandrolone. -Appreciate excellent hospitalist cares. -Monitor electrolytes phosphorus, potassium and magnesemia replacement -continue Zinc replacement -continue TPN -optimize DM2 management- might need addition of basal insulin. -may need to consider ESA for anemia or chronic disease PT/OT evaluation, out of bed to chair. -Abdominal abscesses noted on CT scan. Evaluated by GI, general surgery, and IR. No procedures planned. Continue clear liquids and TPN. Continue IV antibiotics. BC NTD. -Ongoing high ileostomy output. GI following. Stool for C diff and GI studies ordered; pending collection.  Also  consider talking with radiation oncology regarding any additional intervention recommended for treatment of radiation colitis. -Patient will D/C to SNF when medically stable.    LOS: 7 days   KriLake MillsGPCNP-BC, AOCNP 02/15/19  ADDENDUM  Patient was Personally and independently interviewed, examined and relevant elements of the history of present illness were reviewed in details and an assessment and plan was created. All elements of the patient's history of present illness , assessment and plan were discussed in details with KriMikey BussingP. The above documentation reflects our combined findings assessment and plan.  -appreciate surgery, GI, hospitalist and IR input. -continue TPN and supportive GI cares -monitor for bleeding on blood thinner -supportive transfusions -IV Abx and bowel rest  GauSullivan Lone MS

## 2019-02-15 NOTE — Progress Notes (Signed)
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 Usual Weight: 190 lb - 15% weight loss in last month per RD  Insulin Requirements: 17 units given last 24hr, 40 units in TPN  Current Nutrition: NPO, TPN  IVF: none  Central access: Port TPN start date: 5/29  ASSESSMENT                                                                                                          HPI: 63 yo male with burkitt's lymphoma s/p chemo now with severe radiation colitis, prolonged malnutrition, multiple recent admissions to start TPN per pharmacy management  Significant events:  6/3 repeat CT abd/pelvis> Descending and sigmoid colitis with fluid collections, not drainable by IR, Zosyn added, made NPO  Today, 02/15/19  Glucose - goal < 150 while on TPN - 17 Units SSI/24 hours;; CBGs acceptable on TPN w/ 40 units hung. note hx DM and chronic dexamethasone 2 mg qday> decreased to 1 mg/day 6/2. 6/4 decadron held 2nd NPO  Electrolytes -  Na 138 w/ max Na in TPN, K 4.2, Mag 2.1 after 4 gm bolus yesterday; phos 3.6  Renal - WNL  LFTs - AST/ALT stable/WNL  TGs - 71 (6/1)  Prealbumin - 7.7 (6/1), oxandrin added 5/29, dose increased 6/1  NUTRITIONAL GOALS                                                                                             RD recs: 2300-2500 kcal/day, 115-125g protein/day  Custom TPN at goal of 95 ml/hr to provide: 125 g/day protein, 2270Kcal/day.  PLAN                                                                                                                     At 1800 today:  custom cyclic TPN with 2595 mls/day = full support  change cyclic  TPN from  18  hours to 14 hours    TPN to contain standard multivitamins daily and trace elements only on MWF due to [employer].  Standard TPN concentrations of lytes: except Na 155 mEq/L, K at 45, Mag 8, LZ:JQBH 1:1  Continue modSSI/CBGs - custom schedule for cyclic TPN  continue insulin in TPN to 40 units/bag  TPN lab panels on Mondays & Thursdays. BMET & Mg in AM  Eudelia Bunch, Pharm.D 02/15/2019 9:25 AM

## 2019-02-15 NOTE — TOC Progression Note (Signed)
Transition of Care [facility]) - Progression Note    Patient Details  Name: PRESS CASALE MRN: [id number] Date of Birth: [birth year]  [facility]) CM/SW Contact  Shiori Adcox, Juliann Pulse, RN Phone Number: 02/15/2019, 9:45 AM  Clinical Narrative: Sigmoid colitis w/fluid collections. TPN.IV abx.bowel rest.Has auth for [employer] when medically stable-rep Tammy following.      Expected Discharge Plan: [place] Barriers to Discharge: Continued Medical Work up(have received insurance [place].)  Expected Discharge Plan and Services Expected Discharge Plan: [place]   Discharge Planning Services: CM Consult Post Acute Care Choice: [place] Living arrangements for the past 2 months: Single Family Home Expected Discharge Date: (unknown)                         HH Arranged: Therapist, sports, PT [place] Agency: [place] ([place]) Date [place]: 02/08/19 Time [place]: 1244 Representative spoke with at [place]: [place] ([place]) Interventions    Readmission Risk Interventions Readmission Risk Prevention Plan 02/08/2019  Transportation Screening Complete  Medication Review Press photographer) Complete  PCP or Specialist appointment within 3-5 days of discharge Complete  HRI or [place] Complete  SW Recovery Care/Counseling Consult Complete  [place] Not Applicable  Some recent data might be hidden

## 2019-02-15 NOTE — Progress Notes (Signed)
Progress Note    ASSESSMENT AND PLAN:   27.  63 yo male with high ostomy output and persistent diffuse colitis on CT scan though now with new multifocal fluid collections concerning for abscesses. Surgery and IR have evaluated and recommend conservative management with bowel rest and antibiotics. Tmax 99.4 -We added cholestyramine yesterday. RN feels output has since become even more watery though volume has certainly decreased from yesterday. By 11am today he had a total of 225 ml and since then it looks like no more than another 100 ml in the bag. .  -Stool studies not sent at request of ID. Dr. Johnnye Sima asked RN to have GI call him but he isn't listed as being on call today in [place]. I will try and get in touch.  -Continue Cholestyramine. Consider resumption of short chain fatty acid enema   2. Pancytopenia. Hem/Onc evaluated on 6/3, doesn't feel that progression of Burkitt's lymphoma is likely.  Thrombocytopenia felt to be from radiation toxicity. Anemia multifactorial. He has no evidence for GI bleeding at this time.     SUBJECTIVE   Having lower abdominal pain. Getting IV morphine  OBJECTIVE:     Vital signs in last 24 hours: Temp:  [98.6 F (37 C)-99.4 F (37.4 C)] 99.4 F (37.4 C) (06/04 1257) Pulse Rate:  [90-97] 95 (06/04 1257) Resp:  [16-18] 16 (06/04 1257) BP: (102-122)/(66-67) 102/67 (06/04 1257) SpO2:  [97 %-98 %] 98 % (06/04 1257) Last BM Date: 02/13/19(colostomy) General:   Easily awoken. He is trembling at times, says in pain Heart:  Regular rate and rhythm Pulm: Normal respiratory effort, Abdomen:  Soft, nondistended, marked lower abdominal tenderness..         Intake/Output from previous day: 06/03 0701 - 06/04 0700 In: 1175.4 [I.V.:1074.1; IV Piggyback:101.3] Out: 2250 [Urine:1225; Stool:1025] Intake/Output this shift: Total I/O In: 0  Out: 825 [Urine:675; Stool:150]  Lab Results: Recent Labs    02/13/19 0546 02/14/19 0947  WBC 2.5* 3.0*    HGB 7.7* 9.0*  HCT 25.0* 29.1*  PLT 90* 85*   BMET Recent Labs    02/13/19 0546 02/14/19 0947 02/15/19 0753  NA 136 133* 138  K 4.4 4.0 4.2  CL 101 101 105  CO2 27 24 27   GLUCOSE 191* 197* 142*  BUN 15 18 19   CREATININE 0.34* 0.38* 0.36*  CALCIUM 8.2* 8.1* 8.2*   LFT Recent Labs    02/15/19 0753  PROT 5.2*  ALBUMIN 1.9*  AST 11*  ALT 27  ALKPHOS 85  BILITOT 0.6   PT/INR No results for input(s): LABPROT, INR in the last 72 hours. Hepatitis Panel No results for input(s): HEPBSAG, HCVAB, HEPAIGM, HEPBIGM in the last 72 hours.  Ct Abdomen Pelvis W Contrast  Result Date: 02/14/2019 CLINICAL DATA:  Left-sided abdominal pain for several days EXAM: CT ABDOMEN AND PELVIS WITH CONTRAST TECHNIQUE: Multidetector CT imaging of the abdomen and pelvis was performed using the standard protocol following bolus administration of intravenous contrast. CONTRAST:  151m OMNIPAQUE 300 COMPARISON:  01/01/2019 FINDINGS: Lower chest: Mild basilar atelectasis is noted. Hepatobiliary: Stable in appearance from the recent exam. Pancreas: Unremarkable. No pancreatic ductal dilatation or surrounding inflammatory changes. Spleen: Normal in size without focal abnormality. Adrenals/Urinary Tract: Adrenal glands are within normal limits. Scattered small cysts are noted bilaterally. A nonobstructing right renal stone is seen. The ureters are within normal limits. The bladder is partially compressed. Stomach/Bowel: The colon again demonstrates diffuse wall thickening consistent with colitis. There is  a small fluid collection identified adjacent to the mid descending colon which measures approximately 2 x 1.5 cm in greatest dimension. A slightly larger fluid collection is noted surrounding sigmoid colon and extending towards the cecum in the right hemipelvis. This lies in an area of previously seen air-filled pockets likely representing a confluent abscess. This measures at least 8 cm in greatest transverse  dimension but measures only approximately 2.6 cm in AP dimension. More proximal small bowel is within normal limits. Loop ileostomy is noted in the right mid abdomen. Vascular/Lymphatic: Aortic atherosclerosis. No enlarged abdominal or pelvic lymph nodes. Reproductive: Prostate is unremarkable. Other: No abdominal wall hernia or abnormality. No abdominopelvic ascites. Musculoskeletal: No acute or significant osseous findings. IMPRESSION: Multifocal fluid collections along the course of the descending and sigmoid colon as described consistent with developing abscesses. The largest of these lies along the margin of the sigmoid colon as described above. These changes are new from prior exam. Nonobstructing right renal stone stable from the prior exam. These results will be called to the ordering clinician or representative by the Radiologist Assistant, and communication documented in the PACS or zVision Dashboard. Electronically Signed   By: Inez Catalina M.D.   On: 02/14/2019 12:48      Active Problems:   Diabetes mellitus ([place])   Burkitt lymphoma, lymph nodes of multiple sites ([place])   Hyponatremia   Malnutrition of moderate degree   Dehydration   Palliative care by specialist   Goals of care, counseling/discussion   Symptomatic anemia   Atrophy of muscle of multiple sites   Depression   Abnormal CT scan, gastrointestinal tract   Intra-abdominal abscess ([place])     LOS: 7 days   Tye Savoy ,NP 02/15/2019, 3:14 PM

## 2019-02-16 LAB — BASIC METABOLIC PANEL
Anion gap: 5 (ref 5–15)
BUN: 24 mg/dL — ABNORMAL HIGH (ref 8–23)
CO2: 26 mmol/L (ref 22–32)
Calcium: 8.1 mg/dL — ABNORMAL LOW (ref 8.9–10.3)
Chloride: 108 mmol/L (ref 98–111)
Creatinine, Ser: 0.42 mg/dL — ABNORMAL LOW (ref 0.61–1.24)
GFR calc Af Amer: >60 mL/min (ref >60)
GFR calc non Af Amer: >60 mL/min (ref >60)
Glucose, Bld: 159 mg/dL — ABNORMAL HIGH (ref 70–99)
Potassium: 4.3 mmol/L (ref 3.5–5.1)
Sodium: 139 mmol/L (ref 135–145)

## 2019-02-16 LAB — CBC WITH DIFFERENTIAL/PLATELET
Abs Immature Granulocytes: 0.16 10*3/uL — ABNORMAL HIGH (ref 0.00–0.07)
Basophils Absolute: 0 10*3/uL (ref 0.0–0.1)
Basophils Relative: 2 %
Eosinophils Absolute: 0 10*3/uL (ref 0.0–0.5)
Eosinophils Relative: 1 %
HCT: 24.5 % — ABNORMAL LOW (ref 39.0–52.0)
Hemoglobin: 7.5 g/dL — ABNORMAL LOW (ref 13.0–17.0)
Immature Granulocytes: 6 %
Lymphocytes Relative: 7 %
Lymphs Abs: 0.2 10*3/uL — ABNORMAL LOW (ref 0.7–4.0)
MCH: 30.9 pg (ref 26.0–34.0)
MCHC: 30.6 g/dL (ref 30.0–36.0)
MCV: 100.8 fL — ABNORMAL HIGH (ref 80.0–100.0)
Monocytes Absolute: 0.3 10*3/uL (ref 0.1–1.0)
Monocytes Relative: 12 %
Neutro Abs: 2 10*3/uL (ref 1.7–7.7)
Neutrophils Relative %: 72 %
Platelets: 82 10*3/uL — ABNORMAL LOW (ref 150–400)
RBC: 2.43 MIL/uL — ABNORMAL LOW (ref 4.22–5.81)
RDW: 19 % — ABNORMAL HIGH (ref 11.5–15.5)
WBC: 2.7 10*3/uL — ABNORMAL LOW (ref 4.0–10.5)
nRBC: 0 % (ref 0.0–0.2)

## 2019-02-16 LAB — MAGNESIUM: Magnesium: 2 mg/dL (ref 1.7–2.4)

## 2019-02-16 LAB — GLUCOSE, CAPILLARY
Glucose-Capillary: 131 mg/dL — ABNORMAL HIGH (ref 70–99)
Glucose-Capillary: 123 mg/dL — ABNORMAL HIGH (ref 70–99)
Glucose-Capillary: 121 mg/dL — ABNORMAL HIGH (ref 70–99)
Glucose-Capillary: 159 mg/dL — ABNORMAL HIGH (ref 70–99)

## 2019-02-16 MED ORDER — INSULIN ASPART 100 UNIT/ML ~~LOC~~ SOLN
0.0000 [IU] | SUBCUTANEOUS | Status: DC
  Administered 2019-02-16: 2 [IU] via SUBCUTANEOUS
  Administered 2019-02-16: 3 [IU] via SUBCUTANEOUS
  Administered 2019-02-17 (×2): 5 [IU] via SUBCUTANEOUS
  Administered 2019-02-17 – 2019-02-18 (×2): 2 [IU] via SUBCUTANEOUS
  Administered 2019-02-18: 5 [IU] via SUBCUTANEOUS
  Administered 2019-02-18: 3 [IU] via SUBCUTANEOUS
  Administered 2019-02-19: 2 [IU] via SUBCUTANEOUS
  Administered 2019-02-19 – 2019-02-20 (×2): 5 [IU] via SUBCUTANEOUS
  Administered 2019-02-20 (×2): 3 [IU] via SUBCUTANEOUS
  Administered 2019-02-21: 2 [IU] via SUBCUTANEOUS
  Administered 2019-02-21: 3 [IU] via SUBCUTANEOUS
  Administered 2019-02-21: 5 [IU] via SUBCUTANEOUS
  Administered 2019-02-22: 3 [IU] via SUBCUTANEOUS
  Administered 2019-02-22 – 2019-02-23 (×2): 5 [IU] via SUBCUTANEOUS
  Administered 2019-02-23: 2 [IU] via SUBCUTANEOUS
  Administered 2019-02-23 – 2019-02-24 (×2): 3 [IU] via SUBCUTANEOUS
  Administered 2019-02-25: 5 [IU] via SUBCUTANEOUS
  Administered 2019-02-26 (×2): 3 [IU] via SUBCUTANEOUS
  Administered 2019-02-27: 15:00:00 2 [IU] via SUBCUTANEOUS
  Administered 2019-02-27 – 2019-02-28 (×3): 3 [IU] via SUBCUTANEOUS

## 2019-02-16 MED ORDER — TRAVASOL 10 % IV SOLN
INTRAVENOUS | Status: AC
  Administered 2019-02-16: 18:00:00 via INTRAVENOUS
  Filled 2019-02-16: qty 1254

## 2019-02-16 NOTE — Progress Notes (Signed)
Progress Note    ASSESSMENT AND PLAN:   74. 63 yo male with high ostomy output and persistent diffuse colitis on CT scan though now with new multifocal fluid collections concerning for abscesses. Surgery and IR have evaluated and recommend conservative management with bowel rest and antibiotics. Tmax 99.4 . He looks much more comfortable today.  -Significant reduction in watery ostomy output, only ~ 450 ml yesterday. Of note, after reviewing MAR, patient has NOT been getting the Prevalite for the last two days due to NPO status. At this point will discontinue the cholestyramine. If output increases again at some point then it would probably be fine to take the Prevalite or we could change to Colestid pills.   2. Pancytopenia. Numbers slightly worse today. WBC 2.7, hgb 7.5, down from 9. No overt GI bleeding. Platelets stable at 82.     SUBJECTIVE   Surgery recommending continuation of conservative management with antibiotics, bowel rest, TNA    OBJECTIVE:     Vital signs in last 24 hours: Temp:  [99.1 F (37.3 C)-99.6 F (37.6 C)] 99.1 F (37.3 C) (06/05 0910) Pulse Rate:  [81-96] 92 (06/05 0910) Resp:  [16] 16 (06/05 0556) BP: (102-118)/(67-73) 113/73 (06/05 0910) SpO2:  [96 %-98 %] 97 % (06/05 0910) Last BM Date: 02/15/19 General:   Alert, well-developed male in NAD. Looks more comfortable today.  EENT:  Normal hearing, non icteric sclera, conjunctive pink.  Heart:  Regular rate and rhythm Pulm: Normal respiratory effort Abdomen:  Soft, nondistended, mod diffuse lower abdominal tenderness. Watery dark green output in ostomy bag.        Intake/Output from previous day: 06/04 0701 - 06/05 0700 In: 2032.1 [P.O.:120; I.V.:1663.6; IV Piggyback:248.6] Out: 2395 [Urine:1975; Stool:420] Intake/Output this shift: Total I/O In: -  Out: 600 [Urine:500; Stool:100]  Lab Results: Recent Labs    02/14/19 0947 02/16/19 0605  WBC 3.0* 2.7*  HGB 9.0* 7.5*  HCT 29.1* 24.5*    PLT 85* 82*   BMET Recent Labs    02/14/19 0947 02/15/19 0753 02/16/19 0605  NA 133* 138 139  K 4.0 4.2 4.3  CL 101 105 108  CO2 24 27 26   GLUCOSE 197* 142* 159*  BUN 18 19 24*  CREATININE 0.38* 0.36* 0.42*  CALCIUM 8.1* 8.2* 8.1*   LFT Recent Labs    02/15/19 0753  PROT 5.2*  ALBUMIN 1.9*  AST 11*  ALT 27  ALKPHOS 85  BILITOT 0.6   PT/INR No results for input(s): LABPROT, INR in the last 72 hours. Hepatitis Panel No results for input(s): HEPBSAG, HCVAB, HEPAIGM, HEPBIGM in the last 72 hours.  Ct Abdomen Pelvis W Contrast  Result Date: 02/14/2019 CLINICAL DATA:  Left-sided abdominal pain for several days EXAM: CT ABDOMEN AND PELVIS WITH CONTRAST TECHNIQUE: Multidetector CT imaging of the abdomen and pelvis was performed using the standard protocol following bolus administration of intravenous contrast. CONTRAST:  122m OMNIPAQUE 300 COMPARISON:  01/01/2019 FINDINGS: Lower chest: Mild basilar atelectasis is noted. Hepatobiliary: Stable in appearance from the recent exam. Pancreas: Unremarkable. No pancreatic ductal dilatation or surrounding inflammatory changes. Spleen: Normal in size without focal abnormality. Adrenals/Urinary Tract: Adrenal glands are within normal limits. Scattered small cysts are noted bilaterally. A nonobstructing right renal stone is seen. The ureters are within normal limits. The bladder is partially compressed. Stomach/Bowel: The colon again demonstrates diffuse wall thickening consistent with colitis. There is a small fluid collection identified adjacent to the mid descending colon which measures  approximately 2 x 1.5 cm in greatest dimension. A slightly larger fluid collection is noted surrounding sigmoid colon and extending towards the cecum in the right hemipelvis. This lies in an area of previously seen air-filled pockets likely representing a confluent abscess. This measures at least 8 cm in greatest transverse dimension but measures only  approximately 2.6 cm in AP dimension. More proximal small bowel is within normal limits. Loop ileostomy is noted in the right mid abdomen. Vascular/Lymphatic: Aortic atherosclerosis. No enlarged abdominal or pelvic lymph nodes. Reproductive: Prostate is unremarkable. Other: No abdominal wall hernia or abnormality. No abdominopelvic ascites. Musculoskeletal: No acute or significant osseous findings. IMPRESSION: Multifocal fluid collections along the course of the descending and sigmoid colon as described consistent with developing abscesses. The largest of these lies along the margin of the sigmoid colon as described above. These changes are new from prior exam. Nonobstructing right renal stone stable from the prior exam. These results will be called to the ordering clinician or representative by the Radiologist Assistant, and communication documented in the PACS or zVision Dashboard. Electronically Signed   By: Inez Catalina M.D.   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

## 2019-02-16 NOTE — Progress Notes (Signed)
Call placed to Hospitalist provider regarding GI panel & cdiff. Provider request order to be discontinued.

## 2019-02-16 NOTE — Plan of Care (Signed)
  Problem: Activity: Goal: Risk for activity intolerance will decrease Outcome: Progressing   Problem: Nutrition: Goal: Adequate nutrition will be maintained Outcome: Progressing   Problem: Pain Managment: Goal: General experience of comfort will improve Outcome: Progressing   Problem: Skin Integrity: Goal: Risk for impaired skin integrity will decrease Outcome: Progressing

## 2019-02-16 NOTE — Progress Notes (Signed)
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  On his baseline, is able to ambulate independently with a walker but more recently he was unable to stand on his own. Patient has been started on TPN for failure to thrive.  Oncology was following.  Patient was complaining of persistent abdominal discomfort.  CT abdomen/pelvis done today showed Multifocal fluid collections along the course of the descending and sigmoid colon  consistent with developing abscesses.General surgery/IR following.  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  CT abdomen/pelvis done today showed Multifocal fluid collections along the course of the descending and sigmoid colon consistent with developing abscesses.General surgery/IR consulted.Started on zosyn. We will follow up blood cultures.Patient is not septic.  Plan is to continue conservative management without any intervention.Continue Bowel rest.  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  Oncology following.  Currently in remission  History of a small bowel obstruction/ileus: Status post diverting ileostomy on 08/25/2018.  He is having stool output from the ileostomy bag.   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  GI panel, C. difficile negative.  CMV, EBV were negative before .  Elevated ESR and CRP.GI following.New CMV DNA sent,pending . F/U cdiff . Continue cholestyramine.  Pancytopenia: Multifactorial.  Last Hb is 9.  He has been transfused with 3 units of  PRBCs so far.  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  Also has thrombocytopenia.  Failure to thrive/dehydration/electrolyte abnormalities: Due to low oral intake, high ostomy output.  Started on TPN on 02/09/2019.Expected bowel dysfunction for weeks to months.  Continue TPN.  Electrolytes being monitored and supplemented  Diabetes type 2: Continue sliding scale insulin.  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  Depression: Continue Celexa  Debility/deconditioning: Plan is to discharge to skilled nursing facility with TPN when medically stable.  He has been accepted at [facility] facility.        Nutrition Problem: Increased nutrient needs Etiology: chronic illness, cancer and cancer related treatments      DVT prophylaxis: Eliquis Code Status: DNR Family Communication: call wife on phone Disposition Plan: Skilled nursing facility after medical stabilization   Consultants: Hematology/oncology  Procedures: None  Antimicrobials:  Anti-infectives (From admission, onward)   Start     Dose/Rate Route Frequency Ordered Stop   02/14/19 1400  piperacillin-tazobactam (ZOSYN) IVPB 3.375 g     3.375 g 12.5 mL/hr over 240 Minutes Intravenous Every 8 hours 02/14/19 1342        Subjective: Patient seen and examined the bedside this morning.  Hemodynamically stable.  Afebrile.  Abdominal pain persist but tolerable.  Having stool output from ileostomy. Objective: Vitals:   02/15/19 2015 02/16/19 0556 02/16/19 0910 02/16/19 1409  BP: 108/67 118/67 113/73 118/78  Pulse: 96 81 92 89   Resp: _0 Temp: 99.6 F (37.6 C) 99.1 F (37.3 C) 99.1 F (37.3 C) 98.4 F (36.9 C)  TempSrc: Oral Oral Oral Oral  SpO2: 96% 97% 97% 97%  Weight:      Height:        Intake/Output Summary (Last 24 hours) at 02/16/2019 1422 Last data filed at 02/16/2019 0944 Gross per 24 hour  Intake 2032.1 ml  Output 2170 ml  Net -137.9 ml   Filed Weights   02/08/19 1726  Weight: 69 kg    Examination:   General exam:Not in distress, generalized weakness HEENT:PERRL,Oral mucosa moist, Ear/Nose normal on gross exam Respiratory system: Bilateral equal air entry, normal vesicular breath sounds, no wheezes or crackles  Cardiovascular system: S1 & S2 heard, RRR. No JVD, murmurs, rubs, gallops or clicks.  Port-A-Cath on the right chest Gastrointestinal system: Abdomen is nondistended, soft .  Tenderness on the left side.  No organomegaly or masses felt. Normal bowel sounds heard.  Ileostomy Central nervous system: Alert and oriented. No focal neurological deficits. Extremities: No edema, no clubbing ,no cyanosis, distal peripheral pulses palpable. Skin: No rashes, lesions or ulcers,no icterus ,no pallor   Data Reviewed: I have personally reviewed following labs and imaging studies  CBC: Recent Labs  Lab 02/10/19 0359 02/11/19 0336 02/12/19 0415 02/13/19 0546 02/14/19 0947 02/16/19 0605  WBC 3.9* 3.4* 3.2* 2.5* 3.0* 2.7*  NEUTROABS 3.3  --  2.7 3.0 2.4 2.0  HGB 8.8* 8.0* 8.2* 7.7* 9.0* 7.5*  HCT 27.4* 26.0* 26.1* 25.0* 29.1* 24.5*  MCV 93.2 95.9 96.7 98.4 97.0 100.8*  PLT 100* 93* 89* 90* 85* 82*   Basic Metabolic Panel: Recent Labs  Lab 02/11/19 0336 02/12/19 0415 02/13/19 0546 02/14/19 0947 02/15/19 0753 02/16/19 0605  NA 131* 133* 136 133* 138 139  K 4.1 4.0 4.4 4.0 4.2 4.3  CL 100 100 101 101 105 108  CO2 _1 GLUCOSE 205* 129* 191* 197* 142* 159*  BUN _2 24*  CREATININE 0.37* 0.31* 0.34* 0.38* 0.36* 0.42*  CALCIUM 8.2* 8.2* 8.2* 8.1*  8.2* 8.1*  MG 2.0 1.6* 1.8 1.6* 2.1 2.0  PHOS 3.1 3.0 2.7 3.2 3.6  --    GFR: Estimated Creatinine Clearance: 93.4 mL/min (A) (by C-G formula based on SCr of 0.42 mg/dL (L)). Liver Function Tests: Recent Labs  Lab 02/10/19 0359 02/11/19 0336 02/12/19 0415 02/13/19 0546 02/15/19 0753  AST 11* 11* 14* 22 11*  ALT 24 24 36 38 27  ALKPHOS 96 89 107 101 85  BILITOT 0.5 0.3 0.4 0.3 0.6  PROT 5.2* 4.9* 4.9* 4.9* 5.2*  ALBUMIN 2.0* 1.9* 1.8* 1.8* 1.9*   No results for input(s): LIPASE, AMYLASE in the last 168 hours. No results for input(s): AMMONIA in the last 168 hours. Coagulation Profile: No results for input(s): INR, PROTIME in the last 168 hours. Cardiac Enzymes: No results for input(s): CKTOTAL, CKMB, CKMBINDEX, TROPONINI in the last 168 hours. BNP (last 3 results) No results for input(s): PROBNP in the last 8760 hours. HbA1C: No results for input(s): HGBA1C in the last 72 hours. CBG: Recent Labs  Lab 02/15/19 1425 02/15/19 2012 02/16/19 0131 02/16/19 0918 02/16/19 1416  GLUCAP 94 150* 131* 123* 121*   Lipid Profile: No results for input(s): CHOL, HDL, LDLCALC, TRIG, CHOLHDL, LDLDIRECT in the last 72 hours. Thyroid Function Tests: No results  for input(s): TSH, T4TOTAL, FREET4, T3FREE, THYROIDAB in the last 72 hours. Anemia Panel: No results for input(s): VITAMINB12, FOLATE, FERRITIN, TIBC, IRON, RETICCTPCT in the last 72 hours. Sepsis Labs: No results for input(s): PROCALCITON, LATICACIDVEN in the last 168 hours.  Recent Results (from the past 240 hour(s))  Blood culture (routine x 2)     Status: None   Collection Time: 02/07/19 10:14 AM  Result Value Ref Range Status   Specimen Description   Final    BLOOD LEFT ANTECUBITAL Performed at [facility] [address], [place], [place] [address]    Special Requests   Final    BOTTLES DRAWN AEROBIC AND ANAEROBIC Blood Culture adequate volume Performed at [facility]  [address] [place], [place] [address]    Culture   Final    NO GROWTH 5 DAYS Performed at [facility], [place] [address], [place], [place] [address]    Report Status 02/12/2019 FINAL  Final  SARS Coronavirus 2 (CEPHEID - Performed in [facility] hospital lab), Hosp Order     Status: None   Collection Time: 02/07/19 10:14 AM  Result Value Ref Range Status   SARS Coronavirus 2 NEGATIVE NEGATIVE Final    Comment: (NOTE) If result is NEGATIVE SARS-CoV-2 target nucleic acids are NOT DETECTED. The SARS-CoV-2 RNA is generally detectable in upper and lower  respiratory specimens during the acute phase of infection. The lowest  concentration of SARS-CoV-2 viral copies this assay can detect is 250  copies / mL. A negative result does not preclude SARS-CoV-2 infection  and should not be used as the sole basis for treatment or other  patient management decisions.  A negative result may occur with  improper specimen collection / handling, submission of specimen other  than nasopharyngeal swab, presence of viral mutation(s) within the  areas targeted by this assay, and inadequate number of viral copies  (<250 copies / mL). A negative result must be combined with clinical  observations, patient history, and epidemiological information. If result is POSITIVE SARS-CoV-2 target nucleic acids are DETECTED. The SARS-CoV-2 RNA is generally detectable in upper and lower  respiratory specimens dur ing the acute phase of infection.  Positive  results are indicative of active infection with SARS-CoV-2.  Clinical  correlation with patient history and other diagnostic information is  necessary to determine patient infection status.  Positive results do  not rule out bacterial infection or co-infection with other viruses. If result is PRESUMPTIVE POSTIVE SARS-CoV-2 nucleic acids MAY BE PRESENT.   A presumptive positive result was obtained on the submitted specimen  and confirmed on repeat testing.  While  2019 novel coronavirus  (SARS-CoV-2) nucleic acids may be present in the submitted sample  additional confirmatory testing may be necessary for epidemiological  and / or clinical management purposes  to differentiate between  SARS-CoV-2 and other Sarbecovirus currently known to infect humans.  If clinically indicated additional testing with an alternate test  methodology [phone]) is advised. The SARS-CoV-2 RNA is generally  detectable in upper and lower respiratory sp ecimens during the acute  phase of infection. The expected result is Negative. Fact Sheet for Patients:  StrictlyIdeas.no Fact Sheet for Healthcare Providers: BankingDealers.co.za This test is not yet approved or cleared by the [place] FDA and has been authorized for detection and/or diagnosis of SARS-CoV-2 by FDA under an Emergency Use Authorization (EUA).  This EUA will remain in effect (meaning this test can be used) for the  duration of the COVID-19 declaration under Section 564(b)(1) of the Act, 21 U.S.C. section 360bbb-3(b)(1), unless the authorization is terminated or revoked sooner. Performed at [facility], [place] [address], [place], [place] [address]   Blood culture (routine x 2)     Status: None   Collection Time: 02/07/19 11:03 AM  Result Value Ref Range Status   Specimen Description   Final    BLOOD LEFT HAND Performed at [facility] [address] [place], [place] [address]    Special Requests   Final    BOTTLES DRAWN AEROBIC AND ANAEROBIC Blood Culture adequate volume Performed at [facility] [address] [place], [place] [address]    Culture   Final    NO GROWTH 5 DAYS Performed at [facility], [place] [address], [place], [place] [address]    Report Status 02/12/2019 FINAL  Final  Urine culture     Status: None   Collection Time: 02/07/19  4:52 PM  Result Value Ref Range Status    Specimen Description   Final    URINE, RANDOM Performed at [facility] [address], [place], [place] [address]    Special Requests   Final    NONE Performed at [facility], [place] [address] [place], [place] [address]    Culture   Final    NO GROWTH Performed at [facility], [place] [address] [place], [place] [address]    Report Status 02/08/2019 FINAL  Final  Culture, blood (routine x 2)     Status: None (Preliminary result)   Collection Time: 02/14/19  2:01 PM  Result Value Ref Range Status   Specimen Description   Final    BLOOD RIGHT HAND Performed at [facility] [address] [place], [place] [address]    Special Requests   Final    BOTTLES DRAWN AEROBIC AND ANAEROBIC Blood Culture adequate volume Performed at [facility] [address] [place], [place] [address]    Culture   Final    NO GROWTH 2 DAYS Performed at [facility] [address], [place], [place] [address]    Report Status PENDING  Incomplete  Culture, blood (routine x 2)     Status: None (Preliminary result)   Collection Time: 02/14/19  2:02 PM  Result Value Ref Range Status   Specimen Description   Final    BLOOD RIGHT FOREARM Performed at [facility] [address] [place], [place] [address]    Special Requests   Final    BOTTLES DRAWN AEROBIC AND ANAEROBIC Blood Culture adequate volume Performed at [facility] [address] [place], [place] [address]    Culture   Final    NO GROWTH 2 DAYS Performed at [facility] [address], [place], [place] [address]    Report Status PENDING  Incomplete         Radiology Studies: No results found.      Scheduled Meds: . apixaban  5 mg Oral BID  . cholestyramine light  4 g Oral 3 times per day  . citalopram  10 mg Oral Daily  . dexamethasone  1 mg Oral Q breakfast  . insulin aspart  0-15 Units  Subcutaneous 4 times per day  . oxandrolone  5 mg Oral BID  . zinc sulfate  220 mg Oral BID   Continuous Infusions: .  piperacillin-tazobactam (ZOSYN)  IV 3.375 g (02/16/19 0554)  . TPN CYCLIC-ADULT (ION)       LOS: 8 days    Time spent: 35 mins.More than 50% of that time was spent in counseling and/or coordination of care.      [name], MD [facility] Pager [phone]  If 7PM-7AM, please contact night-coverage [email or web address] Password [email or web address] 02/16/2019, 2:22 PM

## 2019-02-16 NOTE — Progress Notes (Addendum)
Call placed to Hospitalist provider regarding GI panel & cdiff. Patient does not meet criteria for testing. IR/ID confirmed. Will postpone collection at this time.

## 2019-02-16 NOTE — Progress Notes (Signed)
Subjective/Chief Complaint: Pt with con't soreness to abdomen    Objective: Vital signs in last 24 hours: Temp:  [99.1 F (37.3 C)-99.6 F (37.6 C)] 99.1 F (37.3 C) (06/05 0556) Pulse Rate:  [81-96] 81 (06/05 0556) Resp:  [16] 16 (06/05 0556) BP: (102-118)/(67) 118/67 (06/05 0556) SpO2:  [96 %-98 %] 97 % (06/05 0556) Last BM Date: 02/15/19  Intake/Output from previous day: 06/04 0701 - 06/05 0700 In: 2032.1 [P.O.:120; I.V.:1663.6; IV Piggyback:248.6] Out: 2395 [Urine:1975; Stool:420] Intake/Output this shift: No intake/output data recorded.  Constitutional: No acute distress, conversant, appears states age. Eyes: Anicteric sclerae, moist conjunctiva, no lid lag Lungs: Clear to auscultation bilaterally, normal respiratory effort CV: regular rate and rhythm, no murmurs, no peripheral edema, pedal pulses 2+ GI: Soft, no masses or hepatosplenomegaly, tender to palpation LLQ and suprapubic area, colostomy patent Skin: No rashes, palpation reveals normal turgor Psychiatric: appropriate judgment and insight, oriented to person, place, and time   Lab Results:  Recent Labs    02/14/19 0947 02/16/19 0605  WBC 3.0* 2.7*  HGB 9.0* 7.5*  HCT 29.1* 24.5*  PLT 85* 82*   BMET Recent Labs    02/15/19 0753 02/16/19 0605  NA 138 139  K 4.2 4.3  CL 105 108  CO2 27 26  GLUCOSE 142* 159*  BUN 19 24*  CREATININE 0.36* 0.42*  CALCIUM 8.2* 8.1*   PT/INR No results for input(s): LABPROT, INR in the last 72 hours. ABG No results for input(s): PHART, HCO3 in the last 72 hours.  Invalid input(s): PCO2, PO2  Studies/Results: Ct Abdomen Pelvis W Contrast  Result Date: 02/14/2019 CLINICAL DATA:  Left-sided abdominal pain for several days EXAM: CT ABDOMEN AND PELVIS WITH CONTRAST TECHNIQUE: Multidetector CT imaging of the abdomen and pelvis was performed using the standard protocol following bolus administration of intravenous contrast. CONTRAST:  137m OMNIPAQUE 300  COMPARISON:  01/01/2019 FINDINGS: Lower chest: Mild basilar atelectasis is noted. Hepatobiliary: Stable in appearance from the recent exam. Pancreas: Unremarkable. No pancreatic ductal dilatation or surrounding inflammatory changes. Spleen: Normal in size without focal abnormality. Adrenals/Urinary Tract: Adrenal glands are within normal limits. Scattered small cysts are noted bilaterally. A nonobstructing right renal stone is seen. The ureters are within normal limits. The bladder is partially compressed. Stomach/Bowel: The colon again demonstrates diffuse wall thickening consistent with colitis. There is a small fluid collection identified adjacent to the mid descending colon which measures approximately 2 x 1.5 cm in greatest dimension. A slightly larger fluid collection is noted surrounding sigmoid colon and extending towards the cecum in the right hemipelvis. This lies in an area of previously seen air-filled pockets likely representing a confluent abscess. This measures at least 8 cm in greatest transverse dimension but measures only approximately 2.6 cm in AP dimension. More proximal small bowel is within normal limits. Loop ileostomy is noted in the right mid abdomen. Vascular/Lymphatic: Aortic atherosclerosis. No enlarged abdominal or pelvic lymph nodes. Reproductive: Prostate is unremarkable. Other: No abdominal wall hernia or abnormality. No abdominopelvic ascites. Musculoskeletal: No acute or significant osseous findings. IMPRESSION: Multifocal fluid collections along the course of the descending and sigmoid colon as described consistent with developing abscesses. The largest of these lies along the margin of the sigmoid colon as described above. These changes are new from prior exam. Nonobstructing right renal stone stable from the prior exam. These results will be called to the ordering clinician or representative by the Radiologist Assistant, and communication documented in the PACS or zVision  Dashboard.  Electronically Signed   By: Inez Catalina M.D.   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  Agree with TPN for hydration and nutrition for now. Con't with abx     LOS: 8 days    Ralene Ok 02/16/2019

## 2019-02-16 NOTE — Progress Notes (Signed)
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 Usual Weight: 190 lb - 15% weight loss in last month per RD  Insulin Requirements: 6 units given last 24hr, 40 units in TPN  Current Nutrition: NPO, TPN  IVF: none  Central access: Port TPN start date: 5/29  ASSESSMENT                                                                                                          HPI: 63 yo male with burkitt's lymphoma s/p chemo now with severe radiation colitis, prolonged malnutrition, multiple recent admissions to start TPN per pharmacy management  Significant events:  6/3 repeat CT abd/pelvis> Descending and sigmoid colitis with fluid collections, not drainable by IR, Zosyn added, made NPO  Today, 02/16/19  Glucose - goal < 150 while on TPN - 6 Units SSI/24 hours;; CBGs acceptable on TPN w/ 40 units during 14 hr cycle. note hx DM and chronic dexamethasone 2 mg qday> decreased to 1 mg/day 6/2. 6/4 decadron held 2nd NPO  Electrolytes -  Na 139 w/ max Na in TPN, K 4.3, Mag 2.0;  Renal - WNL  LFTs - AST/ALT stable/WNL  TGs - 71 (6/1)  Prealbumin - 7.7 (6/1), oxandrin added 5/29, dose increased 6/1>made NPO 6/3  NUTRITIONAL GOALS                                                                                             RD recs: 2300-2500 kcal/day, 115-125g protein/day  Custom TPN at goal of 95 ml/hr to provide: 125 g/day protein, 2270Kcal/day.  PLAN                                                                                                                     At 1800 today:  custom cyclic TPN with 4132 mls/day = full support  change cyclic TPN from 14  hours  to 12 hours    TPN to contain standard multivitamins daily and trace elements only on MWF due to [employer].  Standard TPN concentrations of lytes: except Na 90 mEq/L, K at 45, Mag 8, VB:TYOM 1:1  Continue modSSI/CBGs - custom schedule for cyclic TPN  continue insulin in TPN to 40 units/bag  TPN lab panels on Mondays & Thursdays.   Eudelia Bunch, Pharm.D 02/16/2019 7:54 AM

## 2019-02-16 NOTE — Progress Notes (Signed)
Pt pad soiled underneath him in bed. Refusing to be cleaned at this time. Will continue to attempt and educate

## 2019-02-16 NOTE — Progress Notes (Signed)
Pt pad soiled, pt still refusing to have pad changed by RN or NT. Pt educated again. Will continue to attempt.

## 2019-02-17 LAB — GLUCOSE, CAPILLARY
Glucose-Capillary: 226 mg/dL — ABNORMAL HIGH (ref 70–99)
Glucose-Capillary: 150 mg/dL — ABNORMAL HIGH (ref 70–99)
Glucose-Capillary: 101 mg/dL — ABNORMAL HIGH (ref 70–99)
Glucose-Capillary: 228 mg/dL — ABNORMAL HIGH (ref 70–99)

## 2019-02-17 MED ORDER — LORAZEPAM 2 MG/ML IJ SOLN
0.5000 mg | Freq: Once | INTRAMUSCULAR | Status: AC
  Administered 2019-02-18: 0.5 mg via INTRAVENOUS
  Filled 2019-02-17: qty 1

## 2019-02-17 MED ORDER — TRAVASOL 10 % IV SOLN
INTRAVENOUS | Status: AC
  Administered 2019-02-17: 18:00:00 via INTRAVENOUS
  Filled 2019-02-17: qty 1254

## 2019-02-17 NOTE — Progress Notes (Signed)
Patient is refusing being turned and bath. I asked patient numerous times if we could wash his bottom and clean him up. He continues to report "we are good" and " I am fine."He was very stern with this response.  I also explained risks including skin breakdown, pressure sores, infection, etc. Pt. understands risks and continues to refuse care. Patient is alert and oriented x 4.. He will not even allow me to turn him to check his backside. Will continue to ask and encourage patient to turn/ receive assistance with ADLs throughout the shift.Mark Frey

## 2019-02-17 NOTE — Progress Notes (Signed)
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 Usual Weight: 190 lb - 15% weight loss in last month per RD  Insulin Requirements: 14 units given last 24hr, 40 units in TPN  Current Nutrition: NPO, TPN  IVF: none  Central access: Port TPN start date: 5/29  ASSESSMENT                                                                                                          HPI: 63 yo male with burkitt's lymphoma s/p chemo now with severe radiation colitis, prolonged malnutrition, multiple recent admissions to start TPN per pharmacy management  Significant events:  6/3 repeat CT abd/pelvis> Descending and sigmoid colitis with fluid collections, not drainable by IR, Zosyn added, made NPO  Today, 02/17/19  Glucose - goal < 150 while on TPN - 14 Units SSI/24 hours;; CBGs acceptable on TPN w/ 40 units during 12 hr cycle. note hx DM and chronic dexamethasone 2 mg qday> decreased to 1 mg/day 6/2. 6/4 decadron held 2nd NPO  Electrolytes - no labs today  Renal - WNL  LFTs - AST/ALT stable/WNL  TGs - 71 (6/1)  Prealbumin - 7.7 (6/1), oxandrin added 5/29, dose increased 6/1>made NPO 6/3  NUTRITIONAL GOALS                                                                                             RD recs: 2300-2500 kcal/day, 115-125g protein/day  Custom TPN at goal of 95 ml/hr to provide: 125 g/day protein, 2270Kcal/day.  PLAN                                                                                                                     At 1800 today:  custom cyclic TPN with 8768 mls/day over 12 hours= full support  TPN to contain standard multivitamins  daily and trace elements only on MWF due  to [employer].  Standard TPN concentrations of lytes: except Na 90 mEq/L, K at 45, Mag 8, TV:TWYS 1:1  Continue modSSI/CBGs - custom schedule for cyclic TPN  continue insulin in TPN to 40 units/bag  TPN lab panels on Mondays & Thursdays. BMET in AM  Eudelia Bunch, Pharm.D 02/17/2019 7:58 AM

## 2019-02-17 NOTE — Progress Notes (Signed)
PROGRESS NOTE    Mark Frey  FKC:[id number] DOB: 07/31/1956 DOA: 02/07/2019 PCP: [name], MD   Brief Narrative: Patient is a 63 year old male with history of Burkitt's lymphoma status post radiation and chemotherapy, status post ileostomy, diabetes mellitus, radiation colitis PE/DVT on Eliquis who presents with worsening weakness from home.  On his baseline, is able to ambulate independently with a walker but more recently he was unable to stand on his own. Patient has been started on TPN for failure to thrive.  Oncology was following.  Patient was complaining of persistent abdominal discomfort.  CT abdomen/pelvis done here showed Multifocal fluid collections along the course of the descending and sigmoid colon  consistent with developing abscesses.Started on antibiotics.General surgery/IR following.  Plan is to continue conservative management without any intervention.  Assessment & Plan:   Active Problems:   Diabetes mellitus (HCC)   Burkitt lymphoma, lymph nodes of multiple sites ([place])   Hyponatremia   Malnutrition of moderate degree   Dehydration   Palliative care by specialist   Goals of care, counseling/discussion   Symptomatic anemia   Atrophy of muscle of multiple sites   Depression   Abnormal CT scan, gastrointestinal tract   Intra-abdominal abscess (HCC)  Abdominal abscesses: Patient was complaining of persistent abdominal discomfort.  CT abdomen/pelvis done showed Multifocal fluid collections along the course of the descending and sigmoid colon consistent with developing abscesses.General surgery/IR consulted.Started on zosyn. We will follow up blood cultures.Patient is not septic.  Plan is to continue conservative management without any intervention.Continue Bowel rest.  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  Oncology following.  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  He is having stool output from the ileostomy bag.  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  GI panel, C. difficile negative.  CMV, EBV were negative before .  Elevated ESR and CRP.GI following.New CMV DNA sent,pending . F/U cdiff /GI pathogen pane. Continue cholestyramine.  Pancytopenia: Multifactorial.  Last Hb is 9.  He has been transfused with 3 units of  PRBCs so far.  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  Also has thrombocytopenia.  Failure to thrive/dehydration/electrolyte abnormalities: Due to low oral intake, high ostomy output.  Started on TPN on 02/09/2019.Expected bowel dysfunction for weeks to months.  Continue TPN.  Electrolytes being monitored and supplemented  Diabetes type 2: Continue sliding scale insulin.  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  Depression: Continue Celexa  Debility/deconditioning: Plan is to discharge to skilled nursing facility with TPN when medically stable.  He has been accepted at [facility] facility.        Nutrition Problem: Increased nutrient needs Etiology: chronic illness, cancer and cancer related treatments      DVT prophylaxis: Eliquis Code Status: DNR Family Communication: Will call wife on phone Disposition Plan: Skilled nursing facility after medical stabilization   Consultants: Hematology/oncology  Procedures: None  Antimicrobials:  Anti-infectives (From admission, onward)   Start     Dose/Rate Route Frequency Ordered Stop   02/14/19 1400  piperacillin-tazobactam (ZOSYN) IVPB 3.375 g     3.375 g 12.5 mL/hr over 240 Minutes Intravenous Every 8 hours 02/14/19 1342        Subjective:  Patient seen and examined the bedside this morning.  Hemodynamically stable.  Still reports of abdominal discomfort.  He rated his pain as  4.5/10 today.  Objective: Vitals:   02/16/19  0910 02/16/19 1409 02/16/19 2044 02/17/19 0532  BP: 113/73 118/78 109/74 121/66  Pulse: 92 89 78 92  Resp:  16 19 19   Temp: 99.1 F (37.3 C) 98.4 F (36.9 C) 98.2 F (36.8 C) 98 F (36.7 C)  TempSrc: Oral Oral Oral Oral  SpO2: 97% 97% 98% 97%  Weight:      Height:        Intake/Output Summary (Last 24 hours) at 02/17/2019 1146 Last data filed at 02/17/2019 5631 Gross per 24 hour  Intake 2096.34 ml  Output 1875 ml  Net 221.34 ml   Filed Weights   02/08/19 1726  Weight: 69 kg    Examination:  General exam: Not in distress, looks deconditioned/debilitated HEENT:PERRL,Oral mucosa moist, Ear/Nose normal on gross exam Respiratory system: Bilateral equal air entry, normal vesicular breath sounds, no wheezes or crackles  Cardiovascular system: S1 & S2 heard, RRR. No JVD, murmurs, rubs, gallops or clicks. Gastrointestinal system: Abdomen is nondistended, soft .  Tenderness on the left side.  Ileostomy with dark liquid stool Central nervous system: Alert and oriented. No focal neurological deficits. Extremities: No edema, no clubbing ,no cyanosis, distal peripheral pulses palpable. Skin: No rashes, lesions or ulcers,no icterus ,no pallor    Data Reviewed: I have personally reviewed following labs and imaging studies  CBC: Recent Labs  Lab 02/11/19 0336 02/12/19 0415 02/13/19 0546 02/14/19 0947 02/16/19 0605  WBC 3.4* 3.2* 2.5* 3.0* 2.7*  NEUTROABS  --  2.7 3.0 2.4 2.0  HGB 8.0* 8.2* 7.7* 9.0* 7.5*  HCT 26.0* 26.1* 25.0* 29.1* 24.5*  MCV 95.9 96.7 98.4 97.0 100.8*  PLT 93* 89* 90* 85* 82*   Basic Metabolic Panel: Recent Labs  Lab 02/11/19 0336 02/12/19 0415 02/13/19 0546 02/14/19 0947 02/15/19 0753 02/16/19 0605  NA 131* 133* 136 133* 138 139  K 4.1 4.0 4.4 4.0 4.2 4.3  CL 100 100 101 101 105 108  CO2 24 26 27 24 27 26   GLUCOSE 205* 129* 191* 197* 142* 159*  BUN 14 15 15 18 19  24*  CREATININE 0.37* 0.31* 0.34* 0.38* 0.36* 0.42*  CALCIUM 8.2* 8.2* 8.2* 8.1*  8.2* 8.1*  MG 2.0 1.6* 1.8 1.6* 2.1 2.0  PHOS 3.1 3.0 2.7 3.2 3.6  --    GFR: Estimated Creatinine Clearance: 93.4 mL/min (A) (by C-G formula based on SCr of 0.42 mg/dL (L)). Liver Function Tests: Recent Labs  Lab 02/11/19 0336 02/12/19 0415 02/13/19 0546 02/15/19 0753  AST 11* 14* 22 11*  ALT 24 36 38 27  ALKPHOS 89 107 101 85  BILITOT 0.3 0.4 0.3 0.6  PROT 4.9* 4.9* 4.9* 5.2*  ALBUMIN 1.9* 1.8* 1.8* 1.9*   No results for input(s): LIPASE, AMYLASE in the last 168 hours. No results for input(s): AMMONIA in the last 168 hours. Coagulation Profile: No results for input(s): INR, PROTIME in the last 168 hours. Cardiac Enzymes: No results for input(s): CKTOTAL, CKMB, CKMBINDEX, TROPONINI in the last 168 hours. BNP (last 3 results) No results for input(s): PROBNP in the last 8760 hours. HbA1C: No results for input(s): HGBA1C in the last 72 hours. CBG: Recent Labs  Lab 02/16/19 0918 02/16/19 1416 02/16/19 2045 02/17/19 0158 02/17/19 0616  GLUCAP 123* 121* 159* 226* 150*   Lipid Profile: No results for input(s): CHOL, HDL, LDLCALC, TRIG, CHOLHDL, LDLDIRECT in the last 72 hours. Thyroid Function Tests: No results for input(s): TSH, T4TOTAL, FREET4, T3FREE, THYROIDAB in the last 72  hours. Anemia Panel: No results for input(s): VITAMINB12, FOLATE, FERRITIN, TIBC, IRON, RETICCTPCT in the last 72 hours. Sepsis Labs: No results for input(s): PROCALCITON, LATICACIDVEN in the last 168 hours.  Recent Results (from the past 240 hour(s))  Urine culture     Status: None   Collection Time: 02/07/19  4:52 PM  Result Value Ref Range Status   Specimen Description   Final    URINE, RANDOM Performed at [facility] [address], [place], [place] [address]    Special Requests   Final    NONE Performed at [facility], [place] [address] [place], [place] [address]    Culture   Final    NO GROWTH Performed at [facility], [place] [address], [place], [place] [address]    Report Status 02/08/2019 FINAL  Final  Culture, blood (routine x 2)     Status: None (Preliminary result)   Collection Time: 02/14/19  2:01 PM  Result Value Ref Range Status   Specimen Description   Final    BLOOD RIGHT HAND Performed at [facility] [address] [place], [place] [address]    Special Requests   Final    BOTTLES DRAWN AEROBIC AND ANAEROBIC Blood Culture adequate volume Performed at [facility] [address] [place], [place] [address]    Culture   Final    NO GROWTH 3 DAYS Performed at [facility], [place] [address] [place], [place] [address]    Report Status PENDING  Incomplete  Culture, blood (routine x 2)     Status: None (Preliminary result)   Collection Time: 02/14/19  2:02 PM  Result Value Ref Range Status   Specimen Description   Final    BLOOD RIGHT FOREARM Performed at [facility] [address], [place], [place] [address]    Special Requests   Final    BOTTLES DRAWN AEROBIC AND ANAEROBIC Blood Culture adequate volume Performed at [facility] [address] [place], [place] [address]    Culture   Final    NO GROWTH 3 DAYS Performed at [facility], [place] [address] [place],  [address]    Report Status PENDING  Incomplete         Radiology Studies: No results found.      Scheduled Meds: . apixaban  5 mg Oral BID  . cholestyramine light  4 g Oral 3 times per day  . citalopram  10 mg Oral Daily  . dexamethasone  1 mg Oral Q breakfast  . insulin aspart  0-15 Units Subcutaneous 4 times per day  . oxandrolone  5 mg Oral BID  . zinc sulfate  220 mg Oral BID   Continuous Infusions: . piperacillin-tazobactam (ZOSYN)  IV 3.375 g (02/17/19 0536)  . TPN CYCLIC-ADULT (ION)       LOS: 9 days    Time spent: 35 mins.More than 50% of that time was spent in counseling and/or coordination of care.      [name], MD [facility] Pager [phone]  If 7PM-7AM, please contact night-coverage [email or web address] Password [email or web address] 02/17/2019, 11:46 AM

## 2019-02-17 NOTE — Progress Notes (Signed)
     [facility] Progress Note  CC:  Colitis   Subjective: He complains of 4/10 LLQ abdominal pain. Received Hydrocodone/Acetaminophen 5/364m two tabs at 10:25am, pain is now less. No N/V.   Objective:  Vital signs in last 24 hours: Temp:  [98 F (36.7 C)-98.4 F (36.9 C)] 98 F (36.7 C) (06/06 0532) Pulse Rate:  [78-92] 92 (06/06 0532) Resp:  [16-19] 19 (06/06 0532) BP: (109-121)/(66-78) 121/66 (06/06 0532) SpO2:  [97 %-98 %] 97 % (06/06 0532) Last BM Date: 02/16/19 General: ill appearing male in NAD, generalized weakness. Heart: RRR, no murmur. Right subclavian catheter intact. Pulm: Lungs clear throughout, decreased in the bases. Abdomen: Soft, non distended, hypoactive BS x 4 quads, ostomy draining brown green watery stool with sediment, no peristomal tenderness, mild LLQ tenderness without rebound or guarding.  Extremities:  LEs with 2+ pitting edema, RLE > LLE. Neurologic:  Alert and  oriented x4;  grossly normal neurologically. Psych:  Alert and cooperative. Normal mood and affect.  Intake/Output from previous day: 06/05 0701 - 06/06 0700 In: 2096.3 [P.O.:60; I.V.:1875.9; IV Piggyback:160.4] Out: 2075 [Urine:1825; Stool:250] Intake/Output this shift: Total I/O In: -  Out: 400 [Urine:400]  Lab Results: Recent Labs    02/16/19 0605  WBC 2.7*  HGB 7.5*  HCT 24.5*  PLT 82*   BMET Recent Labs    02/15/19 0753 02/16/19 0605  NA 138 139  K 4.2 4.3  CL 105 108  CO2 27 26  GLUCOSE 142* 159*  BUN 19 24*  CREATININE 0.36* 0.42*  CALCIUM 8.2* 8.1*   LFT Recent Labs    02/15/19 0753  PROT 5.2*  ALBUMIN 1.9*  AST 11*  ALT 27  ALKPHOS 85  BILITOT 0.6    Assessment / Plan:  1. 63yo male with high ostomy output and persistent diffuse colitis on CT scan (most likely radiation colitis, diversion colitis). New multifocal fluid collections concerning for abscesses. Surgery and IR have evaluated, no surgical evaluation recommended to continue  IV antibiotics. ? Ostomy output, 250cc emptied yesterday, however, nurse reports ostomy bag drained multiple times a shift. C. Diff testing was negative. On TPN.  TGs 56. BMP results pending, followed by pharmacy. CMV DNA PCR pending. -continue Zosyn 3.3764mIV Q 12 hrs -monitor ostomy output Q shift -supportive care   2. Pancytopenia. 6/5 WBC 2.7. Hg 7.5. HCT 24.5. PLT 82. CBC not done today. -repeat CBC with diff in am 6/7    LOS: 9 days   CoNoralyn Pick6/02/2019, 12:16 PM

## 2019-02-17 NOTE — Progress Notes (Signed)
   Subjective/Chief Complaint: He reports abdominal pain is about the same, maybe a little less   Objective: Vital signs in last 24 hours: Temp:  [98 F (36.7 C)-99.1 F (37.3 C)] 98 F (36.7 C) (06/06 0532) Pulse Rate:  [78-92] 92 (06/06 0532) Resp:  [16-19] 19 (06/06 0532) BP: (109-121)/(66-78) 121/66 (06/06 0532) SpO2:  [97 %-98 %] 97 % (06/06 0532) Last BM Date: 02/16/19  Intake/Output from previous day: 06/05 0701 - 06/06 0700 In: 2096.3 [P.O.:60; I.V.:1875.9; IV Piggyback:160.4] Out: 2075 [Urine:1825; Stool:250] Intake/Output this shift: Total I/O In: -  Out: 400 [Urine:400]  Exam: Awake and alert Looks comfortable Abdomen soft with mild to mod LLQ tenderness.  Ostomy with good output. No peritoneal signs  Lab Results:  Recent Labs    02/14/19 0947 02/16/19 0605  WBC 3.0* 2.7*  HGB 9.0* 7.5*  HCT 29.1* 24.5*  PLT 85* 82*   BMET Recent Labs    02/15/19 0753 02/16/19 0605  NA 138 139  K 4.2 4.3  CL 105 108  CO2 27 26  GLUCOSE 142* 159*  BUN 19 24*  CREATININE 0.36* 0.42*  CALCIUM 8.2* 8.1*   PT/INR No results for input(s): LABPROT, INR in the last 72 hours. ABG No results for input(s): PHART, HCO3 in the last 72 hours.  Invalid input(s): PCO2, PO2  Studies/Results: No results found.  Anti-infectives: Anti-infectives (From admission, onward)   Start     Dose/Rate Route Frequency Ordered Stop   02/14/19 1400  piperacillin-tazobactam (ZOSYN) IVPB 3.375 g     3.375 g 12.5 mL/hr over 240 Minutes Intravenous Every 8 hours 02/14/19 1342        Assessment/Plan: Descending colitis  Continuing conservative management, bowel rest. Labs, c diff pending Continuing IV antibiotics  LOS: 9 days    Coralie Keens 02/17/2019

## 2019-02-17 NOTE — Progress Notes (Signed)
Patient refusing to be cleaned or turned after being asked multiple times by nursing staff.  Writer explained the risk of infection and risk of additional skin breakdown.  Patient stated he understood the risk and that "he's fine".  Charge nurse and MD made aware.  Will continue to check back with patient and encourage ambulation and/or getting a bath and linen change.

## 2019-02-18 LAB — GLUCOSE, CAPILLARY
Glucose-Capillary: 213 mg/dL — ABNORMAL HIGH (ref 70–99)
Glucose-Capillary: 66 mg/dL — ABNORMAL LOW (ref 70–99)
Glucose-Capillary: 131 mg/dL — ABNORMAL HIGH (ref 70–99)
Glucose-Capillary: 125 mg/dL — ABNORMAL HIGH (ref 70–99)
Glucose-Capillary: 187 mg/dL — ABNORMAL HIGH (ref 70–99)

## 2019-02-18 LAB — BASIC METABOLIC PANEL
Anion gap: 7 (ref 5–15)
BUN: 26 mg/dL — ABNORMAL HIGH (ref 8–23)
CO2: 25 mmol/L (ref 22–32)
Calcium: 8.3 mg/dL — ABNORMAL LOW (ref 8.9–10.3)
Chloride: 104 mmol/L (ref 98–111)
Creatinine, Ser: 0.39 mg/dL — ABNORMAL LOW (ref 0.61–1.24)
GFR calc Af Amer: >60 mL/min (ref >60)
GFR calc non Af Amer: >60 mL/min (ref >60)
Glucose, Bld: 162 mg/dL — ABNORMAL HIGH (ref 70–99)
Potassium: 4.1 mmol/L (ref 3.5–5.1)
Sodium: 136 mmol/L (ref 135–145)

## 2019-02-18 MED ORDER — TRAVASOL 10 % IV SOLN
INTRAVENOUS | Status: AC
  Administered 2019-02-18: 18:00:00 via INTRAVENOUS
  Filled 2019-02-18: qty 1254

## 2019-02-18 MED ORDER — DEXTROSE 50 % IV SOLN
12.5000 g | INTRAVENOUS | Status: AC
  Administered 2019-02-18: 12.5 g via INTRAVENOUS
  Filled 2019-02-18: qty 50

## 2019-02-18 MED ORDER — DEXTROSE 5 % IV SOLN
INTRAVENOUS | Status: AC
  Administered 2019-02-18 – 2019-02-19 (×2): via INTRAVENOUS

## 2019-02-18 NOTE — Progress Notes (Signed)
This RN just went in the room to tell patient that I was going to change him because his bed pad was wet. This is probably the 10th time asking this patient about getting cleaned up/repositioning. Patient responded by saying "bed pads are supposed to be wet."  I educated patient that once bed pads become wet we must change them to prevent skin breakdown.  I told patient since he was not ready quite yet, we would be back in a few minutes to clean him up.  Patient responded with " We'll see". Prior to this, I even gave patient a one time dose of anxiety medication.  Patient may benefit from a psych consult. He is extremely non-interactive, poor hygiene, flat affect, and extremely depressed/anxious. He is also forgetful at times.Mark Frey

## 2019-02-18 NOTE — Progress Notes (Signed)
Spoke with NP [facility] regarding the new order for Cdiff. Patient does not meet criteria for sampling and NP was advised of that. Stated she would speak with her attending and follow up.

## 2019-02-18 NOTE — Progress Notes (Addendum)
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 Usual Weight: 190 lb - 15% weight loss in last month per RD  Current Nutrition: NPO, TPN  IVF: D5W at 50 ml/hr ordered 6/7 am by [facility]  Central access: Port TPN start date: 5/29  ASSESSMENT                                                                                                          HPI: 63 yo male with burkitt's lymphoma s/p chemo now with severe radiation colitis, prolonged malnutrition, multiple recent admissions to start TPN per pharmacy management  Significant events:  6/3 repeat CT abd/pelvis> Descending and sigmoid colitis with fluid collections, not drainable by IR, Zosyn added, made NPO  Today, 02/18/19  Glucose - goal < 150 while on TPN - 10 Units SSI/24 hours;; CBGs 213 and 228 during 12 hr cylcle of TPN w/ 40 units of insulin. CBG down to 66 after TPN cycled off this morning and 1/2 amp dextrose given - pt was asymptomatic. MD ordered d5w at 50 ml/hr.  note hx DM and chronic dexamethasone 2 mg qday> decreased to 1 mg/day 6/2.  Electrolytes - WNL   Renal - WNL  LFTs - AST/ALT stable/WNL  TGs - 71 (6/1)  Prealbumin - 7.7 (6/1), oxandrin added 5/29, dose increased 6/1  NUTRITIONAL GOALS                                                                                             RD recs: 2300-2500 kcal/day, 115-125g protein/day  Custom TPN at goal of 95 ml/hr to provide: 125 g/day protein, 2270Kcal/day.  PLAN                                                                                                                     At 1800 today:  custom cyclic TPN with 4503 mls/day over 12  hours= full support  TPN to contain standard multivitamins daily and trace elements only on MWF due to [employer].  Standard TPN concentrations of lytes: except Na 90 mEq/L, K at 45, Mag 8, TR:ZNBV 1:1  IVF: 6/7 am MD ordered D5w at 50 ml/hr  Continue modSSI/CBGs - custom schedule for cyclic TPN  continue insulin in TPN to 40 units/bag  TPN lab panels on Mondays & Thursdays.   Eudelia Bunch, Pharm.D 02/18/2019 7:20 AM

## 2019-02-18 NOTE — Progress Notes (Signed)
   Subjective/Chief Complaint: Pt with con't abdominal pain No bowel function   Objective: Vital signs in last 24 hours: Temp:  [97.5 F (36.4 C)-98.4 F (36.9 C)] 97.5 F (36.4 C) (06/07 0617) Pulse Rate:  [70-86] 86 (06/07 0617) Resp:  [12-16] 12 (06/07 0617) BP: (97-104)/(65-71) 97/71 (06/07 0617) SpO2:  [98 %] 98 % (06/07 0617) Last BM Date: 02/18/19  Intake/Output from previous day: 06/06 0701 - 06/07 0700 In: 2435.4 [P.O.:30; I.V.:2255.4; IV Piggyback:150] Out: 1571 [Urine:875; Stool:696] Intake/Output this shift: No intake/output data recorded.  Constitutional: No acute distress, conversant, appears states age. Eyes: Anicteric sclerae, moist conjunctiva, no lid lag Lungs: Clear to auscultation bilaterally, normal respiratory effort CV: regular rate and rhythm, no murmurs, no peripheral edema, pedal pulses 2+ GI: Soft, no masses or hepatosplenomegaly, tender to palpation LLQ, no rebound/guarding Skin: No rashes, palpation reveals normal turgor Psychiatric: appropriate judgment and insight, oriented to person, place, and time   Lab Results:  Recent Labs    02/16/19 0605  WBC 2.7*  HGB 7.5*  HCT 24.5*  PLT 82*   BMET Recent Labs    02/16/19 0605 02/18/19 0509  NA 139 136  K 4.3 4.1  CL 108 104  CO2 26 25  GLUCOSE 159* 162*  BUN 24* 26*  CREATININE 0.42* 0.39*  CALCIUM 8.1* 8.3*   Studies/Results: No results found.  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

## 2019-02-18 NOTE — Plan of Care (Signed)
Patient is noncompliant with medications and is also refusing nursing care including turning/repositioning, mouth care, bath, etc.

## 2019-02-18 NOTE — Progress Notes (Signed)
Encouraged patient ambulation as per our agreement at 3pm. Patient stated "I'm good". I asked patient if he understood the importance of getting out of bed. He stated "to clean my bottom". I educated the patient on importance of lung expansion and mobility. Patient responded "I'm good, just turn off my lights". Will continue to encourage OOB and linen changes for patient.

## 2019-02-18 NOTE — Progress Notes (Signed)
Encouraged patient to get up into chair for linen change and ambulation. Patient stated "not right now". I asked patient what time would be a good time for him and we decided on 3pm. Will follow up.

## 2019-02-18 NOTE — Progress Notes (Signed)
Patient is once again refusing nursing care tonight and turns. He reports that he does not want to be cleaned up at this time. Will try again shortly.Mark Frey

## 2019-02-18 NOTE — Progress Notes (Signed)
Patient attempted to refuse bath again this morning, but was covered in urine. NT and I came in and told the patient he was going to get a bath this am. Patient finally allowed [place] to bathe him and was very appreciative afterwards.  Patient has several areas of skin breakdown to buttocks (moisture associated).  This was from the numerous days patient went refusing baths/to be cleaned up.  Pt. Reported "I like to be dirty."  Patient was cleaned, dried well in that area. Barrier cream and allevyn were applied to areas of skin breakdown.  Will continue to encourage patient to turn and receive daily baths. Roderick Pee

## 2019-02-18 NOTE — Progress Notes (Signed)
     [facility] Progress Note  CC:  Radiation/diversion colitis   Subjective: He continues to have LLQ discomfort, no significant pain.   Objective:  Vital signs in last 24 hours: Temp:  [97.5 F (36.4 C)-98.4 F (36.9 C)] 97.5 F (36.4 C) (06/07 0617) Pulse Rate:  [70-86] 86 (06/07 0617) Resp:  [12-16] 12 (06/07 0617) BP: (97-104)/(65-71) 97/71 (06/07 0617) SpO2:  [98 %] 98 % (06/07 0617) Last BM Date: 02/18/19 General:   Alert,  Well-developed,    in NAD Heart: RRR, no murmurs. Pulm: Lungs clear throughout, decreased in the bases. Abdomen: Soft, ostomy site nontender, ostomy draining watery dark green/brown liquid, LLQ tenderness without rebound or guarding. Extremities: LEs with less edema today. Neurologic:  Alert and  oriented x3;  grossly normal neurologically. Generalized weakness. Psych:  Alert and cooperative. Normal mood and affect.  Intake/Output from previous day: 06/06 0701 - 06/07 0700 In: 2435.4 [P.O.:30; I.V.:2255.4; IV Piggyback:150] Out: 1102 [Urine:875; Stool:696] Intake/Output this shift: No intake/output data recorded.  Lab Results: Recent Labs    02/16/19 0605  WBC 2.7*  HGB 7.5*  HCT 24.5*  PLT 82*   BMET Recent Labs    02/16/19 0605 02/18/19 0509  NA 139 136  K 4.3 4.1  CL 108 104  CO2 26 25  GLUCOSE 159* 162*  BUN 24* 26*  CREATININE 0.42* 0.39*  CALCIUM 8.1* 8.3*   LFT No results for input(s): PROT, ALBUMIN, AST, ALT, ALKPHOS, BILITOT, BILIDIR, IBILI in the last 72 hours. PT/INR No results for input(s): LABPROT, INR in the last 72 hours. Hepatitis Panel No results for input(s): HEPBSAG, HCVAB, HEPAIGM, HEPBIGM in the last 72 hours.  No results found.  Assessment / Plan:  63 yo male with high ostomy output and persistent diffuse colitis on CT scan (most likely radiation colitis, diversion colitis). Two new multifocal fluid collections in the left colon concerning for abscesses. Surgery and IR have  evaluated, no surgical evaluation recommended to continue IV antibiotics. Patient declined short chain fatty acid enemas for diversion colitis. C.diff PCR previously ordered but not collected?  NPO on TPN. -Continue Zosyn 3.325m Q 12 hrs -Monitor ostomy output Q shift -Daily BMP, check CBC with diff in am -consider repeating A/P CT tomorrow if LLQ abd pain persists, follow up on abd fluid collections/abscess. -renew order for C. Diff PCR   2. Burkitt's Lymphoma, Pancytopenia. 6/5 WBC 2.7. Hg 7.5. HCT 24.5. PLT 82.      LOS: 10 days   CNoralyn Pick 02/18/2019, 12:18 PM

## 2019-02-18 NOTE — Progress Notes (Signed)
Hypoglycemic Event  CBG: 66  Treatment:1/2 amp Dextrose  Symptoms: None Follow-up CBG: Time:Day RN to follow up at around 0750 CBG Result:pending  Possible Reasons for Event: TPN taken down by IV team  Comments/MD notified:MD notified to see if dextrose IVF needed.     Roderick Pee

## 2019-02-18 NOTE — Progress Notes (Signed)
PROGRESS NOTE    Mark Frey  IYM:[id number] DOB: 1956-05-19 DOA: 02/07/2019 PCP: [name], MD   Brief Narrative: Patient is a 63 year old male with history of Burkitt's lymphoma status post radiation and chemotherapy, status post ileostomy, diabetes mellitus, radiation colitis PE/DVT on Eliquis who presents with worsening weakness from home.  On his baseline, is able to ambulate independently with a walker but more recently he was unable to stand on his own. Patient has been started on TPN for failure to thrive.  Oncology was following.  Patient was complaining of persistent abdominal discomfort.  CT abdomen/pelvis done here showed Multifocal fluid collections along the course of the descending and sigmoid colon  consistent with developing abscesses.Started on antibiotics.General surgery/IR following.  Plan is to continue conservative management without any intervention.  Assessment & Plan:   Active Problems:   Diabetes mellitus (HCC)   Burkitt lymphoma, lymph nodes of multiple sites ([place])   Hyponatremia   Malnutrition of moderate degree   Dehydration   Palliative care by specialist   Goals of care, counseling/discussion   Symptomatic anemia   Atrophy of muscle of multiple sites   Depression   Abnormal CT scan, gastrointestinal tract   Intra-abdominal abscess (HCC)  Abdominal abscesses: Patient was complaining of persistent abdominal discomfort.  CT abdomen/pelvis done showed Multifocal fluid collections along the course of the descending and sigmoid colon consistent with developing abscesses.General surgery/IR consulted.Started on zosyn. We will follow up blood cultures.Patient is not septic.  Plan is to continue conservative management without any intervention.Continue Bowel rest.  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  Oncology following.  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  He is having stool output from the ileostomy bag.  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  GI panel, C. difficile negative.  CMV, EBV were negative before .  Elevated ESR and CRP.GI following.New CMV DNA sent,pending . F/U cdiff /GI pathogen pane. Continue cholestyramine.  Pancytopenia: Multifactorial.  Last Hb is 7.5.  He has been transfused with 3 units of  PRBCs so far.  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  Also has thrombocytopenia.  Failure to thrive/dehydration/electrolyte abnormalities: Due to low oral intake, high ostomy output.  Started on TPN on 02/09/2019.Expected bowel dysfunction for weeks to months.  Continue TPN.  Electrolytes being monitored and supplemented  Diabetes type 2: Continue sliding scale insulin.  Hypglycemic this morning, start on gentle D5.  History of right lower extremity DVT/bilateral PE: On eliquis.  Depression: Continue Celexa  Debility/deconditioning: Plan is to discharge to skilled nursing facility with TPN when medically stable.  He has been accepted at [place] facility.        Nutrition Problem: Increased nutrient needs Etiology: chronic illness, cancer and cancer related treatments      DVT prophylaxis: Eliquis Code Status: DNR Family Communication: Discussed with wife on phone on 02/17/2019  disposition Plan: Skilled nursing facility after medical stabilization   Consultants: Hematology/oncology  Procedures: None  Antimicrobials:  Anti-infectives (From admission, onward)   Start     Dose/Rate Route Frequency Ordered Stop   02/14/19 1400  piperacillin-tazobactam (ZOSYN) IVPB 3.375 g     3.375 g 12.5 mL/hr over 240 Minutes Intravenous Every 8 hours 02/14/19 1342        Subjective:  Patient seen and examined the bedside this morning.  Hemodynamically stable.  Still has some  abdominal discomofrt.  Rates as 4.5 /10.  No  nausea or vomiting.  Objective: Vitals:   02/17/19 0532 02/17/19 1346 02/17/19 2102 02/18/19 0617  BP: 121/66 104/68 103/65 97/71  Pulse: 92 70 84 86  Resp: 19 16 12 12   Temp: 98 F (36.7 C) 98.4 F (36.9 C) 98.4 F (36.9 C) (!) 97.5 F (36.4 C)  TempSrc: Oral Oral Oral   SpO2: 97% 98% 98% 98%  Weight:      Height:        Intake/Output Summary (Last 24 hours) at 02/18/2019 1138 Last data filed at 02/18/2019 0700 Gross per 24 hour  Intake 2435.37 ml  Output 1171 ml  Net 1264.37 ml   Filed Weights   02/08/19 1726  Weight: 69 kg    Examination:  General exam: Not in distress, deconditioned/debilitated HEENT:PERRL,Oral mucosa moist, Ear/Nose normal on gross exam Respiratory system: Bilateral equal air entry, normal vesicular breath sounds, no wheezes or crackles  Cardiovascular system: S1 & S2 heard, RRR. No JVD, murmurs, rubs, gallops or clicks. Gastrointestinal system: Abdomen is nondistended, soft .  Tenderness on the left side.  Ileostomy with stool. Central nervous system: Alert and oriented. No focal neurological deficits. Extremities: No edema, no clubbing ,no cyanosis, distal peripheral pulses palpable. Skin: No rashes, lesions or ulcers,no icterus ,no pallor   Data Reviewed: I have personally reviewed following labs and imaging studies  CBC: Recent Labs  Lab 02/12/19 0415 02/13/19 0546 02/14/19 0947 02/16/19 0605  WBC 3.2* 2.5* 3.0* 2.7*  NEUTROABS 2.7 3.0 2.4 2.0  HGB 8.2* 7.7* 9.0* 7.5*  HCT 26.1* 25.0* 29.1* 24.5*  MCV 96.7 98.4 97.0 100.8*  PLT 89* 90* 85* 82*   Basic Metabolic Panel: Recent Labs  Lab 02/12/19 0415 02/13/19 0546 02/14/19 0947 02/15/19 0753 02/16/19 0605 02/18/19 0509  NA 133* 136 133* 138 139 136  K 4.0 4.4 4.0 4.2 4.3 4.1  CL 100 101 101 105 108 104  CO2 26 27 24 27 26 25   GLUCOSE 129* 191* 197* 142* 159* 162*  BUN 15 15 18 19  24* 26*  CREATININE 0.31* 0.34* 0.38* 0.36* 0.42* 0.39*  CALCIUM 8.2* 8.2* 8.1* 8.2* 8.1* 8.3*   MG 1.6* 1.8 1.6* 2.1 2.0  --   PHOS 3.0 2.7 3.2 3.6  --   --    GFR: Estimated Creatinine Clearance: 93.4 mL/min (A) (by C-G formula based on SCr of 0.39 mg/dL (L)). Liver Function Tests: Recent Labs  Lab 02/12/19 0415 02/13/19 0546 02/15/19 0753  AST 14* 22 11*  ALT 36 38 27  ALKPHOS 107 101 85  BILITOT 0.4 0.3 0.6  PROT 4.9* 4.9* 5.2*  ALBUMIN 1.8* 1.8* 1.9*   No results for input(s): LIPASE, AMYLASE in the last 168 hours. No results for input(s): AMMONIA in the last 168 hours. Coagulation Profile: No results for input(s): INR, PROTIME in the last 168 hours. Cardiac Enzymes: No results for input(s): CKTOTAL, CKMB, CKMBINDEX, TROPONINI in the last 168 hours. BNP (last 3 results) No results for input(s): PROBNP in the last 8760 hours. HbA1C: No results for input(s): HGBA1C in the last 72 hours. CBG: Recent Labs  Lab 02/17/19 1343 02/17/19 2058 02/18/19 0222 02/18/19 0722 02/18/19 0757  GLUCAP 101* 228* 213* 66* 131*   Lipid Profile: No results for input(s): CHOL, HDL, LDLCALC, TRIG, CHOLHDL, LDLDIRECT in the last 72 hours. Thyroid Function Tests: No results for input(s): TSH, T4TOTAL, FREET4, T3FREE, THYROIDAB in the last 72 hours. Anemia Panel: No results for input(s):  VITAMINB12, FOLATE, FERRITIN, TIBC, IRON, RETICCTPCT in the last 72 hours. Sepsis Labs: No results for input(s): PROCALCITON, LATICACIDVEN in the last 168 hours.  Recent Results (from the past 240 hour(s))  Culture, blood (routine x 2)     Status: None (Preliminary result)   Collection Time: 02/14/19  2:01 PM  Result Value Ref Range Status   Specimen Description   Final    BLOOD RIGHT HAND Performed at [facility] [address] [place], [place] [address]    Special Requests   Final    BOTTLES DRAWN AEROBIC AND ANAEROBIC Blood Culture adequate volume Performed at [facility] [address], [place], [place] [address]    Culture   Final    NO  GROWTH 4 DAYS Performed at [facility], [place] [address], [place], [place] [address]    Report Status PENDING  Incomplete  Culture, blood (routine x 2)     Status: None (Preliminary result)   Collection Time: 02/14/19  2:02 PM  Result Value Ref Range Status   Specimen Description   Final    BLOOD RIGHT FOREARM Performed at [facility] [address], [place], [place] [address]    Special Requests   Final    BOTTLES DRAWN AEROBIC AND ANAEROBIC Blood Culture adequate volume Performed at [facility] [address], [place], [place] [address]    Culture   Final    NO GROWTH 4 DAYS Performed at [facility], [place] [address] [place], [place] [address]    Report Status PENDING  Incomplete         Radiology Studies: No results found.      Scheduled Meds: . apixaban  5 mg Oral BID  . cholestyramine light  4 g Oral 3 times per day  . citalopram  10 mg Oral Daily  . dexamethasone  1 mg Oral Q breakfast  . insulin aspart  0-15 Units Subcutaneous 4 times per day  . oxandrolone  5 mg Oral BID  . zinc sulfate  220 mg Oral BID   Continuous Infusions: . dextrose 50 mL/hr at 02/18/19 0803  . piperacillin-tazobactam (ZOSYN)  IV 3.375 g (02/18/19 0546)  . TPN CYCLIC-ADULT (ION)       LOS: 10 days    Time spent: 35 mins.More than 50% of that time was spent in counseling and/or coordination of care.      [name], MD [facility] Pager [phone]  If 7PM-7AM, please contact night-coverage [email or web address] Password [facility] 02/18/2019, 11:38 AM

## 2019-02-19 ENCOUNTER — Inpatient Hospital Stay: Payer: Self-pay

## 2019-02-19 ENCOUNTER — Inpatient Hospital Stay (HOSPITAL_COMMUNITY): Payer: BC Managed Care – PPO

## 2019-02-19 ENCOUNTER — Encounter (HOSPITAL_COMMUNITY): Payer: Self-pay | Admitting: Radiology

## 2019-02-19 DIAGNOSIS — E1165 Type 2 diabetes mellitus with hyperglycemia: Secondary | ICD-10-CM

## 2019-02-19 DIAGNOSIS — E43 Unspecified severe protein-calorie malnutrition: Secondary | ICD-10-CM

## 2019-02-19 DIAGNOSIS — E291 Testicular hypofunction: Secondary | ICD-10-CM

## 2019-02-19 DIAGNOSIS — E60 Dietary zinc deficiency: Secondary | ICD-10-CM

## 2019-02-19 DIAGNOSIS — D630 Anemia in neoplastic disease: Secondary | ICD-10-CM

## 2019-02-19 DIAGNOSIS — K5651 Intestinal adhesions [bands], with partial obstruction: Secondary | ICD-10-CM

## 2019-02-19 DIAGNOSIS — R1032 Left lower quadrant pain: Secondary | ICD-10-CM

## 2019-02-19 LAB — DIFFERENTIAL
Abs Immature Granulocytes: 0.15 10*3/uL — ABNORMAL HIGH (ref 0.00–0.07)
Basophils Absolute: 0 10*3/uL (ref 0.0–0.1)
Basophils Relative: 1 %
Eosinophils Absolute: 0 10*3/uL (ref 0.0–0.5)
Eosinophils Relative: 1 %
Immature Granulocytes: 4 %
Lymphocytes Relative: 9 %
Lymphs Abs: 0.3 10*3/uL — ABNORMAL LOW (ref 0.7–4.0)
Monocytes Absolute: 0.3 10*3/uL (ref 0.1–1.0)
Monocytes Relative: 9 %
Neutro Abs: 2.6 10*3/uL (ref 1.7–7.7)
Neutrophils Relative %: 76 %

## 2019-02-19 LAB — GASTROINTESTINAL PANEL BY PCR, STOOL (REPLACES STOOL CULTURE)

## 2019-02-19 LAB — PREALBUMIN: Prealbumin: 16 mg/dL — ABNORMAL LOW (ref 18–38)

## 2019-02-19 LAB — GLUCOSE, CAPILLARY
Glucose-Capillary: 245 mg/dL — ABNORMAL HIGH (ref 70–99)
Glucose-Capillary: 145 mg/dL — ABNORMAL HIGH (ref 70–99)
Glucose-Capillary: 105 mg/dL — ABNORMAL HIGH (ref 70–99)
Glucose-Capillary: 112 mg/dL — ABNORMAL HIGH (ref 70–99)

## 2019-02-19 LAB — COMPREHENSIVE METABOLIC PANEL
ALT: 25 U/L (ref 0–44)
AST: 12 U/L — ABNORMAL LOW (ref 15–41)
Albumin: 1.9 g/dL — ABNORMAL LOW (ref 3.5–5.0)
Alkaline Phosphatase: 92 U/L (ref 38–126)
Anion gap: 10 (ref 5–15)
BUN: 24 mg/dL — ABNORMAL HIGH (ref 8–23)
CO2: 23 mmol/L (ref 22–32)
Calcium: 8.2 mg/dL — ABNORMAL LOW (ref 8.9–10.3)
Chloride: 101 mmol/L (ref 98–111)
Creatinine, Ser: 0.36 mg/dL — ABNORMAL LOW (ref 0.61–1.24)
GFR calc Af Amer: >60 mL/min (ref >60)
GFR calc non Af Amer: >60 mL/min (ref >60)
Glucose, Bld: 233 mg/dL — ABNORMAL HIGH (ref 70–99)
Potassium: 4.2 mmol/L (ref 3.5–5.1)
Sodium: 134 mmol/L — ABNORMAL LOW (ref 135–145)
Total Bilirubin: <0.1 mg/dL — ABNORMAL LOW (ref 0.3–1.2)
Total Protein: 5.1 g/dL — ABNORMAL LOW (ref 6.5–8.1)

## 2019-02-19 LAB — CBC
HCT: 25.2 % — ABNORMAL LOW (ref 39.0–52.0)
Hemoglobin: 7.4 g/dL — ABNORMAL LOW (ref 13.0–17.0)
MCH: 30 pg (ref 26.0–34.0)
MCHC: 29.4 g/dL — ABNORMAL LOW (ref 30.0–36.0)
MCV: 102 fL — ABNORMAL HIGH (ref 80.0–100.0)
Platelets: 98 10*3/uL — ABNORMAL LOW (ref 150–400)
RBC: 2.47 MIL/uL — ABNORMAL LOW (ref 4.22–5.81)
RDW: 18 % — ABNORMAL HIGH (ref 11.5–15.5)
WBC: 3.4 10*3/uL — ABNORMAL LOW (ref 4.0–10.5)
nRBC: 0 % (ref 0.0–0.2)

## 2019-02-19 LAB — MAGNESIUM: Magnesium: 1.9 mg/dL (ref 1.7–2.4)

## 2019-02-19 LAB — IRON AND TIBC
Iron: 80 ug/dL (ref 45–182)
Saturation Ratios: 46 % — ABNORMAL HIGH (ref 17.9–39.5)
TIBC: 172 ug/dL — ABNORMAL LOW (ref 250–450)
UIBC: 92 ug/dL

## 2019-02-19 LAB — CULTURE, BLOOD (ROUTINE X 2)
Culture: NO GROWTH
Culture: NO GROWTH
Special Requests: ADEQUATE
Special Requests: ADEQUATE

## 2019-02-19 LAB — C DIFFICILE QUICK SCREEN W PCR REFLEX
C Diff antigen: NEGATIVE
C Diff interpretation: NOT DETECTED
C Diff toxin: NEGATIVE

## 2019-02-19 LAB — TRIGLYCERIDES: Triglycerides: 98 mg/dL (ref <150)

## 2019-02-19 LAB — C-REACTIVE PROTEIN: CRP: 3.3 mg/dL — ABNORMAL HIGH (ref <1.0)

## 2019-02-19 LAB — PHOSPHORUS: Phosphorus: 2.7 mg/dL (ref 2.5–4.6)

## 2019-02-19 LAB — SEDIMENTATION RATE: Sed Rate: 140 mm/hr — ABNORMAL HIGH (ref 0–16)

## 2019-02-19 LAB — PREPARE RBC (CROSSMATCH)

## 2019-02-19 MED ORDER — ACETAMINOPHEN 325 MG PO TABS
650.0000 mg | ORAL_TABLET | Freq: Once | ORAL | Status: AC
  Administered 2019-02-19: 650 mg via ORAL
  Filled 2019-02-19: qty 2

## 2019-02-19 MED ORDER — IOHEXOL 300 MG/ML  SOLN: 30.0000 mL | Freq: Once | INTRAMUSCULAR | Status: DC | PRN

## 2019-02-19 MED ORDER — TRAVASOL 10 % IV SOLN
INTRAVENOUS | Status: AC
  Administered 2019-02-19: 20:00:00 via INTRAVENOUS
  Filled 2019-02-19: qty 1254

## 2019-02-19 MED ORDER — APIXABAN 5 MG PO TABS
5.0000 mg | ORAL_TABLET | Freq: Two times a day (BID) | ORAL | Status: DC
  Administered 2019-02-20: 5 mg via ORAL
  Filled 2019-02-19: qty 1

## 2019-02-19 MED ORDER — SODIUM CHLORIDE 0.9% IV SOLUTION
Freq: Once | INTRAVENOUS | Status: AC
  Administered 2019-02-19: 15:00:00 via INTRAVENOUS

## 2019-02-19 MED ORDER — SODIUM CHLORIDE (PF) 0.9 % IJ SOLN
INTRAMUSCULAR | Status: AC
  Filled 2019-02-19: qty 50

## 2019-02-19 MED ORDER — DIPHENHYDRAMINE HCL 25 MG PO CAPS
25.0000 mg | ORAL_CAPSULE | Freq: Once | ORAL | Status: AC
  Administered 2019-02-19: 25 mg via ORAL
  Filled 2019-02-19: qty 1

## 2019-02-19 NOTE — Progress Notes (Signed)
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  On his baseline, is able to ambulate independently with a walker but more recently he was unable to stand on his own. Patient has been started on TPN for failure to thrive.  Oncology was following.  Patient was complaining of persistent abdominal discomfort.  CT abdomen/pelvis done here showed Multifocal fluid collections along the course of the descending and sigmoid colon  consistent with developing abscesses.Started on antibiotics.General surgery/IR following.  Plan is to continue conservative management without any intervention.  6/8: He still has abdominal discomfort.  Rated pain as 4.5 /10.  Plan is to do a CT abdomen/pelvis today.  Assessment & Plan:   Active Problems:   Diabetes mellitus (HCC)   Burkitt lymphoma, lymph nodes of multiple sites ([place])   Hyponatremia   Malnutrition of moderate degree   Dehydration   Palliative care by specialist   Goals of care, counseling/discussion   Symptomatic anemia   Atrophy of muscle of multiple sites   Depression   Abnormal CT scan, gastrointestinal tract   Intra-abdominal abscess (HCC)  Abdominal abscesses: Patient was complaining of persistent abdominal discomfort.  CT abdomen/pelvis done showed Multifocal fluid collections along the course of the descending and sigmoid colon consistent with developing abscesses.General surgery/IR consulted.Started on zosyn. We will follow up blood cultures.Patient is not septic.  Plan is to continue conservative management without any intervention.Continue Bowel rest. CT abdomen/pelvis being ordered.  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  Oncology following.  Currently in remission  History of a small bowel obstruction/ileus: Status post diverting ileostomy on 08/25/2018.  He is having stool output from the ileostomy bag.  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  GI panel, C. difficile negative.  CMV, EBV were negative before .  Elevated ESR and CRP.GI following.New CMV DNA sent,pending .Negativecdiff /pending GI pathogen pane. Continue cholestyramine.  Pancytopenia: Multifactorial.  Last Hb is 7.4.  He has been transfused with 3 units of  PRBCs so far.  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  Also has thrombocytopenia.  Failure to thrive/dehydration/electrolyte abnormalities: Due to low oral intake, high ostomy output.  Started on TPN on 02/09/2019.Expected bowel dysfunction for weeks to months.  Continue TPN.  Electrolytes being monitored and supplemented  Diabetes type 2: Continue sliding scale insulin. Monitor CBGs.  History of right lower extremity DVT/bilateral PE: On eliquis.  Depression: Continue Celexa  Debility/deconditioning: Plan is to discharge to skilled nursing facility with TPN when medically stable.  He has been accepted at [facility] facility.        Nutrition Problem: Increased nutrient needs Etiology: chronic illness, cancer and cancer related treatments      DVT prophylaxis: Eliquis Code Status: DNR Family Communication: Discussed with wife on phone on 02/17/2019  disposition Plan: Skilled nursing facility after medical stabilization   Consultants: Hematology/oncology  Procedures: None  Antimicrobials:  Anti-infectives (From admission, onward)   Start     Dose/Rate Route Frequency Ordered Stop   02/14/19 1400  piperacillin-tazobactam (ZOSYN) IVPB 3.375 g     3.375 g 12.5 mL/hr over 240 Minutes Intravenous Every 8 hours 02/14/19 1342  Subjective:  Patient seen  and examined the bedside this morning.  Hemodynamically stable.  Still complains of abdominal discomfort same as yesterday.  I discussed with him about importance of continuing patient care by the nurses and requested him not to deny any medicines .  Objective: Vitals:   02/18/19 0617 02/18/19 1406 02/18/19 2337 02/19/19 0633  BP: 97/71 104/64 113/71 107/66  Pulse: 86 88 76 90  Resp: 12 16 14 16   Temp: (!) 97.5 F (36.4 C) 98.2 F (36.8 C) 98.2 F (36.8 C) 98.2 F (36.8 C)  TempSrc:  Oral Oral Oral  SpO2: 98% 97% 98% 97%  Weight:      Height:        Intake/Output Summary (Last 24 hours) at 02/19/2019 1251 Last data filed at 02/19/2019 1100 Gross per 24 hour  Intake 2089.68 ml  Output 1125 ml  Net 964.68 ml   Filed Weights   02/08/19 1726  Weight: 69 kg    Examination:   General exam: Not in distress, deconditioned/debilitated HEENT:PERRL,Oral mucosa moist, Ear/Nose normal on gross exam Respiratory system: Bilateral equal air entry, normal vesicular breath sounds, no wheezes or crackles  Cardiovascular system: S1 & S2 heard, RRR. No JVD, murmurs, rubs, gallops or clicks.  Chemo-Port on the right chest  gastrointestinal system: Abdomen is nondistended, soft .  Tenderness on the left side. No organomegaly or masses felt. Normal bowel sounds heard.  Ileostomy with dark liquidy stool. Central nervous system: Alert and oriented. No focal neurological deficits. Extremities: No edema, no clubbing ,no cyanosis, distal peripheral pulses palpable. Skin: No rashes, lesions or ulcers,no icterus ,no pallor    Data Reviewed: I have personally reviewed following labs and imaging studies  CBC: Recent Labs  Lab 02/13/19 0546 02/14/19 0947 02/16/19 0605 02/19/19 0444  WBC 2.5* 3.0* 2.7* 3.4*  NEUTROABS 3.0 2.4 2.0 2.6  HGB 7.7* 9.0* 7.5* 7.4*  HCT 25.0* 29.1* 24.5* 25.2*  MCV 98.4 97.0 100.8* 102.0*  PLT 90* 85* 82* 98*   Basic Metabolic Panel: Recent Labs  Lab 02/13/19 0546  02/14/19 0947 02/15/19 0753 02/16/19 0605 02/18/19 0509 02/19/19 0444  NA 136 133* 138 139 136 134*  K 4.4 4.0 4.2 4.3 4.1 4.2  CL 101 101 105 108 104 101  CO2 27 24 27 26 25 23   GLUCOSE 191* 197* 142* 159* 162* 233*  BUN 15 18 19  24* 26* 24*  CREATININE 0.34* 0.38* 0.36* 0.42* 0.39* 0.36*  CALCIUM 8.2* 8.1* 8.2* 8.1* 8.3* 8.2*  MG 1.8 1.6* 2.1 2.0  --  1.9  PHOS 2.7 3.2 3.6  --   --  2.7   GFR: Estimated Creatinine Clearance: 93.4 mL/min (A) (by C-G formula based on SCr of 0.36 mg/dL (L)). Liver Function Tests: Recent Labs  Lab 02/13/19 0546 02/15/19 0753 02/19/19 0444  AST 22 11* 12*  ALT 38 27 25  ALKPHOS 101 85 92  BILITOT 0.3 0.6 <0.1*  PROT 4.9* 5.2* 5.1*  ALBUMIN 1.8* 1.9* 1.9*   No results for input(s): LIPASE, AMYLASE in the last 168 hours. No results for input(s): AMMONIA in the last 168 hours. Coagulation Profile: No results for input(s): INR, PROTIME in the last 168 hours. Cardiac Enzymes: No results for input(s): CKTOTAL, CKMB, CKMBINDEX, TROPONINI in the last 168 hours. BNP (last 3 results) No results for input(s): PROBNP in the last 8760 hours. HbA1C: No results for input(s): HGBA1C in the last 72 hours. CBG: Recent Labs  Lab 02/18/19 0757 02/18/19 1443 02/18/19 1959  02/19/19 0154 02/19/19 0629  GLUCAP 131* 125* 187* 245* 145*   Lipid Profile: Recent Labs    02/19/19 0444  TRIG 98   Thyroid Function Tests: No results for input(s): TSH, T4TOTAL, FREET4, T3FREE, THYROIDAB in the last 72 hours. Anemia Panel: Recent Labs    02/19/19 0946  TIBC 172*  IRON 80   Sepsis Labs: No results for input(s): PROCALCITON, LATICACIDVEN in the last 168 hours.  Recent Results (from the past 240 hour(s))  Culture, blood (routine x 2)     Status: None   Collection Time: 02/14/19  2:01 PM  Result Value Ref Range Status   Specimen Description   Final    BLOOD RIGHT HAND Performed at [facility] [address], [place],  [place] [address]    Special Requests   Final    BOTTLES DRAWN AEROBIC AND ANAEROBIC Blood Culture adequate volume Performed at [facility] [address], [place], [place] [address]    Culture   Final    NO GROWTH 5 DAYS Performed at [facility], [place] [address] [place], [place] [address]    Report Status 02/19/2019 FINAL  Final  Culture, blood (routine x 2)     Status: None   Collection Time: 02/14/19  2:02 PM  Result Value Ref Range Status   Specimen Description   Final    BLOOD RIGHT FOREARM Performed at [facility] [address], [place], [place] [address]    Special Requests   Final    BOTTLES DRAWN AEROBIC AND ANAEROBIC Blood Culture adequate volume Performed at [facility] [address], [place], [place] [address]    Culture   Final    NO GROWTH 5 DAYS Performed at [facility], [place] [address], [place], [place] [address]    Report Status 02/19/2019 FINAL  Final  C difficile quick scan w PCR reflex     Status: None   Collection Time: 02/18/19 11:43 PM  Result Value Ref Range Status   C Diff antigen NEGATIVE NEGATIVE Final   C Diff toxin NEGATIVE NEGATIVE Final   C Diff interpretation No C. difficile detected.  Final    Comment: Performed at [facility], [place] [address] [place], [place] [address]         Radiology Studies: [place] Ekg Site Rite  Result Date: 02/19/2019 If [facility] image not attached, placement could not be confirmed due to current cardiac rhythm.       Scheduled Meds: . sodium chloride   Intravenous Once  . acetaminophen  650 mg Oral Once  . apixaban  5 mg Oral BID  . cholestyramine light  4 g Oral 3 times per day  . citalopram  10 mg Oral Daily  . dexamethasone  1 mg Oral Q breakfast  . diphenhydrAMINE  25 mg Oral Once  . insulin aspart  0-15 Units Subcutaneous 4 times per day  . oxandrolone  5 mg Oral BID  . zinc sulfate  220 mg Oral BID    Continuous Infusions: . piperacillin-tazobactam (ZOSYN)  IV 3.375 g (02/19/19 0516)  . TPN CYCLIC-ADULT (ION)       LOS: 11 days    Time spent: 35 mins.More than 50% of that time was spent in counseling and/or coordination of care.      [name], MD [facility] Pager [phone]  If 7PM-7AM, please contact night-coverage [email or web address] Password [facility] 02/19/2019, 12:51 PM

## 2019-02-19 NOTE — Progress Notes (Signed)
Spoke with Joellen Jersey, RN and Jacques Earthly, Case management regarding PICC placement.  Patient currently has PAC and history of DVT.  Unfortunately per CM with patient receiving TNA he must have PICC to be able to discharge to SNF.  Patient is not discharging today.  Per Joellen Jersey, RN patient is due to have CT today and can wait for PICC placement 6-9 if unable to do insertion today.  Carolee Rota, RN VAST

## 2019-02-19 NOTE — Progress Notes (Signed)
[facility] Progress Note     Subjective: CC-  Patient states that he feels the same as he did when he was admitted, no better no worse. Continues to have diffuse abdominal pain. Pain is most severe in the LLQ. No n/v. Having some output from ostomy but it is very loose.  Objective: Vital signs in last 24 hours: Temp:  [98.2 F (36.8 C)] 98.2 F (36.8 C) (06/08 4128) Pulse Rate:  [76-90] 90 (06/08 0633) Resp:  [14-16] 16 (06/08 0633) BP: (104-113)/(64-71) 107/66 (06/08 0633) SpO2:  [97 %-98 %] 97 % (06/08 0633) Last BM Date: 02/18/19  Intake/Output from previous day: 06/07 0701 - 06/08 0700 In: 2029.7 [I.V.:1888.1; IV Piggyback:141.6] Out: 1025 [Urine:800; Stool:225] Intake/Output this shift: No intake/output data recorded.  PE: Gen:  Alert, NAD, cooperative HEENT: EOM's intact, pupils equal and round Pulm:  effort normal Abd: Soft, nondistended, mild diffuse TTP with most significant pain in LLQ, no peritonitis, ostomy viable with air and green liquid in bag Skin: warm and dry  Lab Results:  Recent Labs    02/19/19 0444  WBC 3.4*  HGB 7.4*  HCT 25.2*  PLT 98*   BMET Recent Labs    02/18/19 0509 02/19/19 0444  NA 136 134*  K 4.1 4.2  CL 104 101  CO2 25 23  GLUCOSE 162* 233*  BUN 26* 24*  CREATININE 0.39* 0.36*  CALCIUM 8.3* 8.2*   PT/INR No results for input(s): LABPROT, INR in the last 72 hours. CMP     Component Value Date/Time   NA 134 (L) 02/19/2019 0444   K 4.2 02/19/2019 0444   CL 101 02/19/2019 0444   CO2 23 02/19/2019 0444   GLUCOSE 233 (H) 02/19/2019 0444   BUN 24 (H) 02/19/2019 0444   CREATININE 0.36 (L) 02/19/2019 0444   CREATININE 0.78 01/25/2019 1050   CREATININE 1.26 12/01/2012 1700   CALCIUM 8.2 (L) 02/19/2019 0444   PROT 5.1 (L) 02/19/2019 0444   ALBUMIN 1.9 (L) 02/19/2019 0444   AST 12 (L) 02/19/2019 0444   AST 18 01/25/2019 1050   ALT 25 02/19/2019 0444   ALT 59 (H) 01/25/2019 1050   ALKPHOS 92 02/19/2019  0444   BILITOT <0.1 (L) 02/19/2019 0444   BILITOT 0.6 01/25/2019 1050   GFRNONAA >60 02/19/2019 0444   GFRNONAA >60 01/25/2019 1050   GFRAA >60 02/19/2019 0444   GFRAA >60 01/25/2019 1050   Lipase     Component Value Date/Time   LIPASE 19 08/17/2018 1738       Studies/Results: [place] Ekg Site Rite  Result Date: 02/19/2019 If Site Rite image not attached, placement could not be confirmed due to current cardiac rhythm.   Anti-infectives: Anti-infectives (From admission, onward)   Start     Dose/Rate Route Frequency Ordered Stop   02/14/19 1400  piperacillin-tazobactam (ZOSYN) IVPB 3.375 g     3.375 g 12.5 mL/hr over 240 Minutes Intravenous Every 8 hours 02/14/19 1342         Assessment/Plan Hx PE/DVT - on Eliquis Dehydration PCM - prealbumin 6.5>>7.7>>16 - on TPN Palliative care - DNR Pancytopenia  FTT/ severe deconditioning  - dehydration/electrolyte abnormalities  Type II diabetes Depression   Descending and sigmoid colitis with fluid collections - IRdoesn't feel it is drainable based on CT scan 6/3 Hx of radiation colitis/proctitis 01/01/19 Burkitt's lymphoma with chemotherapy & radiation therapy - ongoing  FEN:Con'tNPO/TPN ID: Zosyn 6/3>> DVT: SCD's, Eliquis Follow up: TBD POC: [place] Spouse [phone]  [phone]  Plan:Repeat CT scan today. Continue bowel rest, TPN, IV zosyn.  C diff negative, GI panel pending.   LOS: 11 days    Wellington Hampshire , [facility] 02/19/2019, 10:28 AM Pager: [phone] Mon-Thurs 7:00 am-4:30 pm Fri 7:00 am -11:30 AM Sat-Sun 7:00 am-11:30 am

## 2019-02-19 NOTE — Progress Notes (Addendum)
HEMATOLOGY-ONCOLOGY PROGRESS NOTE  SUBJECTIVE: Resting quietly. Abdominal pain unchanged. Still with liquid stool from ostomy. Chart reviewed and note that he has been refusing care. Repeat CT ordered for later today.   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  Based on the IHC studies, the differential diagnosis includes diffuse large B-cell lymphoma, Burkitt lymphoma as well as but can like lymphoma.     03/15/2018 Initial Diagnosis    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[place])    03/15/2018 Procedure    Colonoscopy showed a large tumor in the proximal ascending colon, likely from the direct invasion of his abdominal lymphoma.  Biopsy confirmed high-grade B-cell lymphoma.    03/17/2018 -  Chemotherapy    R-EPOCH every 3 weeks    03/17/2018 Procedure    Bone marrow biopsy was negative for lymphoma involvement.    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 He received intrathecal methotrexate.     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[place])   04/10/2018 Initial Diagnosis    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[place])    04/16/2018 -  Chemotherapy    The patient had riTUXimab (RITUXAN) 900 mg in sodium chloride 0.9 % 250 mL (2.6471 mg/mL) infusion, 375 mg/m2 = 900 mg, Intravenous,  Once, 1 of 1 cycle riTUXimab (RITUXAN) 900 mg in sodium chloride 0.9 % 160 mL infusion, 375 mg/m2 = 900 mg, Intravenous,  Once, 5 of 6  cycles Administration: 900 mg (04/17/2018), 900 mg (05/08/2018), 900 mg (05/29/2018), 900 mg (07/10/2018), 900 mg (06/19/2018)  for chemotherapy treatment.       REVIEW OF SYSTEMS:   A comprehensive 14 point review of systems was negative except as noted in the HPI.  I have reviewed the past medical history, past surgical history, social history and family history with the patient and they are unchanged from previous note.   PHYSICAL EXAMINATION:  Vitals:   02/18/19 2337 02/19/19 0633  BP: 113/71 107/66  Pulse: 76 90  Resp: 14 16  Temp: 98.2 F (36.8 C) 98.2 F (36.8 C)  SpO2: 98% 97%   Filed Weights   02/08/19 1726  Weight: 152 lb 1.9 oz (69 kg)    Intake/Output from previous day: 06/07 0701 - 06/08 0700 In: 2029.7 [I.V.:1888.1; IV Piggyback:141.6] Out: 1025 [Urine:800; Stool:225]  GENERAL:alert, no distress and comfortable SKIN: No rashes or significant lesions. EYES: normal, Conjunctiva are pink and non-injected, sclera clear OROPHARYNX:no exudate, no erythema and lips, buccal mucosa, and tongue normal  NECK: supple, thyroid normal size, non-tender, without nodularity LYMPH:  no palpable lymphadenopathy in the cervical, axillary or inguinal LUNGS: clear to auscultation and percussion with normal breathing effort HEART: regular rate & rhythm and no murmurs and no lower extremity edema. ABDOMEN: Soft, mild tenderness with light palpation.  Liquid stool noted in the ostomy.  Positive bowel sounds. Musculoskeletal:no cyanosis of digits and no clubbing  NEURO: alert & oriented x 3,no focal motor/sensory deficits.  Flat affect.  LABORATORY DATA:  I have reviewed the data as listed CMP Latest Ref Rng & Units 02/19/2019 02/18/2019 02/16/2019  Glucose 70 - 99 mg/dL 233(H) 162(H) 159(H)  BUN 8 - 23 mg/dL 24(H) 26(H) 24(H)  Creatinine 0.61 - 1.24 mg/dL 0.36(L) 0.39(L) 0.42(L)  Sodium 135 - 145 mmol/L 134(L) 136 139  Potassium 3.5 - 5.1 mmol/L 4.2 4.1 4.3  Chloride 98 - 111 mmol/L  101 104 108  CO2 22 - 32 mmol/L _0 Calcium 8.9 - 10.3 mg/dL 8.2(L) 8.3(L) 8.1(L)  Total Protein 6.5 - 8.1 g/dL 5.1(L) - -  Total Bilirubin 0.3 - 1.2 mg/dL <0.1(L) - -  Alkaline Phos 38 - 126 U/L 92 - -  AST 15 - 41 U/L 12(L) - -  ALT 0 - 44 U/L 25 - -    Lab Results  Component Value Date   WBC 3.4 (L) 02/19/2019   HGB 7.4 (L) 02/19/2019   HCT 25.2 (L) 02/19/2019   MCV 102.0 (H) 02/19/2019   PLT 98 (L) 02/19/2019   NEUTROABS 2.6 02/19/2019    Ct Abdomen Pelvis W Contrast  Result Date: 02/14/2019 CLINICAL DATA:  Left-sided abdominal pain for several days EXAM: CT ABDOMEN AND PELVIS WITH CONTRAST TECHNIQUE: Multidetector CT imaging of the abdomen and pelvis was performed using the standard protocol following bolus administration of intravenous contrast. CONTRAST:  154m OMNIPAQUE 300 COMPARISON:  01/01/2019 FINDINGS: Lower chest: Mild basilar atelectasis is noted. Hepatobiliary: Stable in appearance from the recent exam. Pancreas: Unremarkable. No pancreatic ductal dilatation or surrounding inflammatory changes. Spleen: Normal in size without focal abnormality. Adrenals/Urinary Tract: Adrenal glands are within normal limits. Scattered small cysts are noted bilaterally. A nonobstructing right renal stone is seen. The ureters are within normal limits. The bladder is partially compressed. Stomach/Bowel: The colon again demonstrates diffuse wall thickening consistent with colitis. There is a small fluid collection identified adjacent to the mid descending colon which measures approximately 2 x 1.5 cm in greatest dimension. A slightly larger fluid collection is noted surrounding sigmoid colon and extending towards the cecum in the right hemipelvis. This lies in an area of previously seen air-filled pockets likely representing a confluent abscess. This measures at least 8 cm in greatest transverse dimension but measures only approximately 2.6 cm in AP dimension. More proximal small bowel is within  normal limits. Loop ileostomy is noted in the right mid abdomen. Vascular/Lymphatic: Aortic atherosclerosis. No enlarged abdominal or pelvic lymph nodes. Reproductive: Prostate is unremarkable. Other: No abdominal wall hernia or abnormality. No abdominopelvic ascites. Musculoskeletal: No acute or significant osseous findings. IMPRESSION: Multifocal fluid collections along the course of the descending and sigmoid colon as described consistent with developing abscesses. The largest of these lies along the margin of the sigmoid colon as described above. These changes are new from prior exam. Nonobstructing right renal stone stable from the prior exam. These results will be called to the ordering clinician or representative by the Radiologist Assistant, and communication documented in the PACS or zVision Dashboard. Electronically Signed   By: MInez CatalinaM.D.   On: 02/14/2019 12:48   [place] Site Rite  Result Date: 02/19/2019 If Site Rite image not attached, placement could not be confirmed due to current cardiac rhythm.   ASSESSMENT AND PLAN: 1. 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  3. H/o Small bowel obstruction/ileuswith sigmoid thickening/stenosis . 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 Greer Pickerel  4. 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 GI panel and C. difficile negative.  CMV IgM negative, EBV IgM negative. 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 Sed rate and CRP still elevated  5.Hypovolemic hyponatremia. Other electrolyte issues including hypokalemia and hypomagnesemia  6. Pancytopenia -  Anemia likely multifactorial but could be from GI bleeding plus significant inflammation from severe radiation colitis. Leukopenia/lymphopenia  likely from radiation toxicity. Thrombocytopenia from consumption related to GI bleeding and from radiation toxicity.- nearly normalized. B12 within normal limits Copper levels within normal limits at 132 Zinc deficiency noted- 44 rpt this admission 60 Viral work-upnot revealing Low likelihood of Burkitt's lymphoma progression at this time.  7. Admitted with failure to thrive, dehydration electrolyte abnormalities and hyperglycemia with poor p.o. intake. 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  8. Depression - discussed with patient. Continue Celexa and optimize dose to 68m in 1-2 weeks.  9. Symptomatic anemia -transfuse prn to maintain hgb>8 given symptoms and some GI losses.  10. Moderate-severe protein calorie malnutrition and muscle waste  11. Hypogonadism - secondary. Very low testosterone levels.  PLAN -Labs reviewed with the patient.  Anticipate some blood count changes due to hemodilution from TPN. WBC up today. ANC normal. -Recommend PRBC transfusion for hemoglobin less than 8.0 due to ongoing GI blood losses.  Hemoglobin drifting down. Recommend 1 unit PRBC due to fatigue and ongoing GI blood losses.   -optimize nutrition -Continue Oxandrolone to 543mpo BID -Continue Eliquis back to 48m59mo BID in the setting of improved plt counts no overt bleeding and some increased risk of VTE with decreased ambulation and adding oxandrolone. -Appreciate excellent hospitalist care. -Monitor electrolytes phosphorus, potassium and magnesium replacement -continue Zinc replacement -continue TPN -optimize DM2 management- might need addition of basal insulin. -may need to consider ESA for anemia or chronic disease PT/OT evaluation, out of bed to chair. -Abdominal abscesses noted on CT scan. Evaluated by GI, general surgery, and IR. No procedures planned. Patient is NPO, on TPN. Continue IV antibiotics. Repeat CT ordered for today. -Ongoing high ileostomy output. GI following.  Stool for C diff negative. GI panel pending.  Also consider talking with radiation oncology regarding any additional intervention recommended for treatment of radiation colitis. -Patient will D/C to SNF when medically stable.    LOS: 11 days   KriMikey BussingNP, AGPCNP-BC, AOCNP 02/19/19   ADDENDUM  .Patient was Personally and independently interviewed, examined and relevant elements of the history of present illness were reviewed in details and an assessment and plan was created. All elements of the patient's history of present illness , assessment and plan were discussed in details with KriMikey BussingP. The above documentation reflects our combined findings assessment and plan.  GauSullivan Lone MS

## 2019-02-19 NOTE — Progress Notes (Signed)
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 Usual Weight: 190 lb - 15% weight loss in last month per RD  Current Nutrition: NPO, TPN  IVF: none - D5W d/c'd 6/8 AM  Central access: Port TPN start date: 5/29  ASSESSMENT                                                                                                          HPI: 63 yo male with burkitt's lymphoma s/p chemo now with severe radiation colitis, prolonged malnutrition, multiple recent admissions to start TPN per pharmacy management  Significant events:  6/3 repeat CT abd/pelvis> Descending and sigmoid colitis with fluid collections, not drainable by IR, Zosyn added, made NPO  Today, 02/19/19  Glucose - goal < 834 while on cyclic TPN (tolerate higher goal CBGs while on cyclic) - 12 Units HDQ/22 hours; CBGs 187 and 245 during 12 hr cylcle of TPN w/ 40 units of insulin. CBGs while TPN off 131, 125 and 145 1 hr after TPN turned off this AM. Note hx DM and chronic dexamethasone 2 mg qday> decreased to 1 mg/day 6/2.  Electrolytes - WNL   Renal - WNL  LFTs - AST/ALT stable/WNL  TGs - 71 (6/1), 98 (6/8)  Prealbumin - 7.7 (6/1), 16 (6/8) oxandrin added 5/29, dose increased 6/1  NUTRITIONAL GOALS                                                                                             RD recs: 2300-2500 kcal/day, 115-125g protein/day  Custom TPN at goal of 95 ml/hr over 29NLG or 12 hr cyclic administration getting 2280 ml to provide: 125 g/day protein, 2270Kcal/day.  PLAN                                                                                                                    At 1800  today:  Continue custom cyclic TPN with 4847 mls/day over 12 hours = full support  TPN to contain standard multivitamins daily and trace elements only on MWF due to national shortage.  Standard TPN concentrations of lytes: except Na 100 mEq/L, K at 45, Mag 8, UW:TKTC 1:1  IVF: none  Continue modSSI/CBGs - custom schedule for cyclic TPN  Continue current insulin amount of 40 units in TPN bag for today as D5W infusion has been d/c'd so will see how CBGs look without extra dextrose also infusing for tonight  TPN lab panels on Mondays & Thursdays.     Adrian Saran, PharmD, BCPS 02/19/2019 10:00 AM

## 2019-02-19 NOTE — Plan of Care (Signed)
Patient continues to be non-compliant with his care plan. Patient refuses help with ADLs. He also refuses mobilization and some of his medications also. Mark Frey

## 2019-02-19 NOTE — Progress Notes (Signed)
Patient ID: Mark Frey, male   DOB: 1956-07-02, 63 y.o.   MRN: [id number]    Progress Note   Subjective  Day # 12  Continues to c/o LLQ pain , intermittent ,sharp rates at 5/10- also with duller persistent discomfort diffusely - no worse today . Some mucous from rectum- liquid ostomy output 160 during night- pt says overall about the same  On TPN  Continues on Zosyn C. difficile quick scan negative yesterday GI path panel ordered yesterday pending     Objective   Vital signs in last 24 hours: Temp:  [98.2 F (36.8 C)] 98.2 F (36.8 C) (06/08 2725) Pulse Rate:  [76-90] 90 (06/08 0633) Resp:  [14-16] 16 (06/08 0633) BP: (104-113)/(64-71) 107/66 (06/08 3664) SpO2:  [97 %-98 %] 97 % (06/08 4034) Last BM Date: 02/18/19 General: ill appearing WM   White male in NAD Heart:  Regular rate and rhythm; no murmurs Lungs: Respirations even and unlabored, lungs CTA bilaterally Abdomen:  Soft, tender LLQ, no rebound and nondistended. Normal bowel sounds.Ileostomy  Extremities:  Without edema. Neurologic:  Alert and oriented,  grossly normal neurologically. Psych:  Cooperative. Normal mood and affect.  Intake/Output from previous day: 06/07 0701 - 06/08 0700 In: 2029.7 [I.V.:1888.1; IV Piggyback:141.6] Out: 1025 [Urine:800; Stool:225] Intake/Output this shift: No intake/output data recorded.  Lab Results: Recent Labs    02/19/19 0444  WBC 3.4*  HGB 7.4*  HCT 25.2*  PLT 98*   BMET Recent Labs    02/18/19 0509 02/19/19 0444  NA 136 134*  K 4.1 4.2  CL 104 101  CO2 25 23  GLUCOSE 162* 233*  BUN 26* 24*  CREATININE 0.39* 0.36*  CALCIUM 8.3* 8.2*   LFT Recent Labs    02/19/19 0444  PROT 5.1*  ALBUMIN 1.9*  AST 12*  ALT 25  ALKPHOS 92  BILITOT <0.1*   PT/INR No results for input(s): LABPROT, INR in the last 72 hours.  Studies/Results: [employer]  Result Date: 02/19/2019 If [employer] not attached, placement could not be confirmed due to current  cardiac rhythm.      Assessment / Plan:    #1 63 yo male with Burkitt's lymphoma, status post chemotherapy with persistent diffuse colitis on CT scan, and continued high ostomy output.  Had previously diagnosed with acute radiation colitis less minus component of diversion colitis.  Last CT 6/3  showed 2 new multifocal fluid collections along the left colon concerning for abscesses-covering with Zosyn.-For repeat CT today.  Patient had declined short-chain fatty acids Has been on TPN and bowel rest/n.p.o.  C. 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

## 2019-02-20 DIAGNOSIS — K5100 Ulcerative (chronic) pancolitis without complications: Secondary | ICD-10-CM

## 2019-02-20 LAB — BASIC METABOLIC PANEL
Anion gap: 9 (ref 5–15)
BUN: 19 mg/dL (ref 8–23)
CO2: 22 mmol/L (ref 22–32)
Calcium: 8.1 mg/dL — ABNORMAL LOW (ref 8.9–10.3)
Chloride: 101 mmol/L (ref 98–111)
Creatinine, Ser: 0.36 mg/dL — ABNORMAL LOW (ref 0.61–1.24)
GFR calc Af Amer: >60 mL/min (ref >60)
GFR calc non Af Amer: >60 mL/min (ref >60)
Glucose, Bld: 181 mg/dL — ABNORMAL HIGH (ref 70–99)
Potassium: 4 mmol/L (ref 3.5–5.1)
Sodium: 132 mmol/L — ABNORMAL LOW (ref 135–145)

## 2019-02-20 LAB — GLUCOSE, CAPILLARY
Glucose-Capillary: 202 mg/dL — ABNORMAL HIGH (ref 70–99)
Glucose-Capillary: 164 mg/dL — ABNORMAL HIGH (ref 70–99)
Glucose-Capillary: 108 mg/dL — ABNORMAL HIGH (ref 70–99)
Glucose-Capillary: 174 mg/dL — ABNORMAL HIGH (ref 70–99)

## 2019-02-20 LAB — PHOSPHORUS: Phosphorus: 2.6 mg/dL (ref 2.5–4.6)

## 2019-02-20 LAB — BPAM RBC
Blood Product Expiration Date: 202007062359
ISSUE DATE / TIME: 202006081530
Unit Type and Rh: 5100

## 2019-02-20 LAB — TYPE AND SCREEN
ABO/RH(D): O POS
Antibody Screen: NEGATIVE
Unit division: 0

## 2019-02-20 LAB — CMV DNA, QUANTITATIVE, PCR
CMV DNA Quant: NEGATIVE IU/mL
Log10 CMV Qn DNA Pl: UNDETERMINED log10 IU/mL

## 2019-02-20 LAB — MAGNESIUM: Magnesium: 1.8 mg/dL (ref 1.7–2.4)

## 2019-02-20 MED ORDER — TRAVASOL 10 % IV SOLN
INTRAVENOUS | Status: AC
  Administered 2019-02-20: 18:00:00 via INTRAVENOUS
  Filled 2019-02-20: qty 1254

## 2019-02-20 MED ORDER — NONFORMULARY OR COMPOUNDED ITEM
60.0000 mL | Freq: Two times a day (BID) | Status: DC
  Filled 2019-02-20: qty 1

## 2019-02-20 MED ORDER — NON FORMULARY: 60.0000 mL | Freq: Two times a day (BID) | Status: DC

## 2019-02-20 MED ORDER — NONFORMULARY OR COMPOUNDED ITEM
60.0000 mL | Freq: Two times a day (BID) | Status: DC
  Filled 2019-02-20 (×15): qty 1

## 2019-02-20 NOTE — Progress Notes (Addendum)
Pt wants to go forward with aspiration of pericolonic fluid.  He has had his AM dose of Eliquis.  I have paged Dr. Tawanna Solo so we can coordinate stopping his anticoagulation for the procedure in about 48 hours.    [facility] [phone]  02/20/2019 11:25 AM

## 2019-02-20 NOTE — Progress Notes (Addendum)
Patient ID: Mark Frey, male   DOB: March 04, 1956, 63 y.o.   MRN: [id number]    Progress Note   Subjective  Day # 13 Continues on Zosyn  Continues Cyclic TPN GI path panel -neg  CMV DNA - pend  ESR - 140 Ileostomy outpt- 250 cc recorded yesterday--pt says having about 4 mucoid discharges from rectum daily  Which has been pattern for months. Abdominal  pain is the same, mostly LLQ Hungry    Objective   Vital signs in last 24 hours: Temp:  [98 F (36.7 C)-98.6 F (37 C)] 98.6 F (37 C) (06/09 0522) Pulse Rate:  [75-86] 86 (06/09 0522) Resp:  [12-16] 12 (06/09 0522) BP: (107-112)/(67-72) 111/67 (06/09 0522) SpO2:  [97 %-99 %] 97 % (06/09 0522) Last BM Date: 02/19/19 General:    white male  in NAD Affect flat Heart:  Regular rate and rhythm; no murmurs Lungs: Respirations even and unlabored, lungs CTA bilaterally Abdomen:  Soft, tender mild diffuse lower abdomen , and in LLQ , no rebound and nondistended. Normal bowel sounds.Ileostomy RMQ semi liquid brown  Extremities:  Without edema. Neurologic:  Alert and oriented,  grossly normal neurologically. Psych:  Cooperative. Normal mood and affect.  Intake/Output from previous day: 06/08 0701 - 06/09 0700 In: 2458.9 [P.O.:120; I.V.:1810.7; Blood:354; IV Piggyback:174.2] Out: 1100 [Urine:750; Stool:350] Intake/Output this shift: No intake/output data recorded.  Lab Results: Recent Labs    02/19/19 0444  WBC 3.4*  HGB 7.4*  HCT 25.2*  PLT 98*   BMET Recent Labs    02/18/19 0509 02/19/19 0444 02/20/19 0425  NA 136 134* 132*  K 4.1 4.2 4.0  CL 104 101 101  CO2 25 23 22   GLUCOSE 162* 233* 181*  BUN 26* 24* 19  CREATININE 0.39* 0.36* 0.36*  CALCIUM 8.3* 8.2* 8.1*   LFT Recent Labs    02/19/19 0444  PROT 5.1*  ALBUMIN 1.9*  AST 12*  ALT 25  ALKPHOS 92  BILITOT <0.1*   PT/INR No results for input(s): LABPROT, INR in the last 72 hours.  Studies/Results: Ct Abdomen Pelvis W Contrast  Result Date:  02/19/2019 CLINICAL DATA:  Burkitt's lymphoma. Failure to thrive. Fluid collections along the colon. Post bowel resection EXAM: CT ABDOMEN AND PELVIS WITH CONTRAST TECHNIQUE: Multidetector CT imaging of the abdomen and pelvis was performed using the standard protocol following bolus administration of intravenous contrast. CONTRAST:  181m OMNIPAQUE IOHEXOL 300 MG/ML  SOLN COMPARISON:  CT 02/14/2019 FINDINGS: Lower chest: No pneumonia at the lung bases. Hepatobiliary: No focal hepatic lesion. No hepatic abscess. Gallbladder normal. Pancreas: Pancreas is normal. No ductal dilatation. No pancreatic inflammation. Spleen: Normal spleen Adrenals/urinary tract: Adrenal glands normal. Small hypodense lesions in the kidney likely represent benign cysts no evidence of pyelonephritis. No hydronephrosis bladder normal Stomach/Bowel: Stomach, duodenum normal. Loop ileostomy in the mid RIGHT abdomen. Towards the terminal ileum the last 6 cm of small bowel demonstrate mucosal enhancement and fluid within lumen. The ascending colon demonstrates thickened nodular mucosa. There is no fluid collection surrounding the ascending colon. The transverse colon is mildly thick-walled with enhancement. The descending colon does demonstrate a pericolonic fluid collection beneath the serosal surface. There is adherence of the descending colon to the lateral abdominal wall in the mid descending colon (image 41/2). This is contiguous with the fluid collection surrounding the descending colon and proximal sigmoid colon which may communicate with the distal ileum at several locations (image 62/5 and image 89/5. Rectum normal. The fluid collection extending  along the descending colon is similar to comparison exam. The fluid collection at the junction of the sigmoid colon and small bowel is decreased in volume. There is no clear evidence of extraluminal abscess. No evidence of perforation. Vascular/Lymphatic: Abdominal aorta is normal caliber. No  periportal or retroperitoneal adenopathy. No pelvic adenopathy. Reproductive: Prostate normal Other: No free fluid. Musculoskeletal: No aggressive osseous lesion. IMPRESSION: 1. Persistent mucosal thickening and enhancement involving the entire colon consistent with pancolitis. 2. Fluid within the bowel wall of the descending colon appears to communicate with the distal ileum ileum at the level the sigmoid colon. The fluid collection is less prominent at the level of the sigmoid colon compared to exam 5 days prior. 3. Serosal surface of the descending colon is adherent to the lateral abdominal wall peritoneal surface. 4. No intraperitoneal free air.  No extraluminal abscess. Electronically Signed   By: Suzy Bouchard M.D.   On: 02/19/2019 15:56   [place] Ekg Site Rite  Result Date: 02/19/2019 If Site Rite image not attached, placement could not be confirmed due to current cardiac rhythm.      Assessment / Plan:    #1 63 yo WM with hx of Burkitt's lymphoma status post radiation and chemotherapy and currently felt to be in remission.  Patient had complication of recurrent bowel obstructions and severe sigmoid stricture which he underwent loop ileostomy. Patient initially diagnosed with colitis in April 2020 this was fairly immediately after completing radiation and felt to be most likely a radiation colitis though could not rule out diversion colitis.  He was initially treated with cholestyramine and short-chain fast fatty acid enemas and with instillation via his ileostomy.  Readmitted 02/08/2019 with progressive weakness, failure to thrive and poor oral intake and found on CT scan to have a diffuse colitis with 2 associated fluid collections the largest 8 cm x 2 cm in the left colon concerning for abscess.  Patient has been on IV antibiotics, currently on Zosyn He underwent repeat infectious work-up with stool for C. difficile, GI path panel earlier this week both of which have been negative and CMV  DNA is pending.  He is not having any significant "diarrhea" and is not having high volume ileostomy output at present.  He continues to pass mucoid discharge from his rectum no change over the past several weeks.  With increased complaints of left lower quadrant pain he underwent repeat CT scan yesterday as outlined above showing a persistent pancolitis.  The fluid within the bowel wall of the descending colon appears to be communicating with the distal l ileum at several locations ,at the level of the sigmoid colon this area is less prominent.  Also noted serosal surface of the descending colon adherent to the lateral wall of the abdomen.  No free air and no extraluminal abscess.  #2 depression-secondary to acute and chronic illness #3 malnutrition multifactoral-on cyclic T PN #4 pancytopenia-stable  Plan; patient is hungry and will let him try clear liquids Continue TPN Will add short-chain fatty acids via the ileostomy-this will have to be compounded at an outside pharmacy and likely will be started tomorrow   Dr. Lorrene Reid now considering total colectomy with ileostomy as he does not seem to be having any significant improvement in his diffuse colitis and has now developed associated fluid collections. Agree that surgery may ultimately be needed.  We will update patient's wife later today.        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

## 2019-02-20 NOTE — Progress Notes (Signed)
Patient would not allow this RN or the nurse tech to help clean him up after mucous stools. When asked when a good time to come back to help him would be he continued to state "I'm good." Multiple attempts were made to assist patient and change linen as well as wash him up but he refused.

## 2019-02-20 NOTE — Progress Notes (Signed)
Peripherally Inserted Central Catheter/Midline Placement  The IV Nurse has discussed with the patient and/or persons authorized to consent for the patient, the purpose of this procedure and the potential benefits and risks involved with this procedure.  The benefits include less needle sticks, lab draws from the catheter, and the patient may be discharged home with the catheter. Risks include, but not limited to, infection, bleeding, blood clot (thrombus formation), and puncture of an artery; nerve damage and irregular heartbeat and possibility to perform a PICC exchange if needed/ordered by physician.  Alternatives to this procedure were also discussed.  Bard Power PICC patient education guide, fact sheet on infection prevention and patient information card has been provided to patient /or left at bedside.    PICC/Midline Placement Documentation        Mark Frey 02/20/2019, 9:56 AM

## 2019-02-20 NOTE — Progress Notes (Signed)
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 Usual Weight: 190 lb - 15% weight loss in last month per RD  Current Nutrition: NPO, TPN  IVF: none - D5W d/c'd 6/8 AM  Central access: Port TPN start date: 5/29  ASSESSMENT                                                                                                          HPI: 63 yo male with burkitt's lymphoma s/p chemo now with severe radiation colitis, prolonged malnutrition, multiple recent admissions to start TPN per pharmacy management  Significant events:  6/3 repeat CT abd/pelvis> Descending and sigmoid colitis with fluid collections, not drainable by IR, Zosyn added, made NPO  Today, 02/20/19  Glucose - goal < 161 while on cyclic TPN (tolerate higher goal CBGs while on cyclic) - 12 Units WRU/04 hours;   CBGs 112 and 202 during 12 hr cylcle of TPN w/ 40 units of insulin.  CBGs while TPN off 145, 164 1 hr after TPN turned off this AM. Note hx DM and chronic dexamethasone 2 mg qday > decreased to 1 mg/day 6/2.  Electrolytes - WNL, except Na 132  Renal - WNL  LFTs - AST/ALT stable/WNL  TGs - 71 (6/1), 98 (6/8)  Prealbumin - 7.7 (6/1), 16 (6/8) oxandrin added 5/29, dose increased 6/1  NUTRITIONAL GOALS                                                                                             RD recs: 2300-2500 kcal/day, 115-125g protein/day  Custom TPN at goal of 95 ml/hr over 54UJW or 12 hr cyclic administration getting 2280 ml to provide: 125 g/day protein, 2270Kcal/day.  PLAN  At 1800 today:  Continue custom cyclic TPN with 1610 mls/day over 12 hours = full support  TPN to contain standard multivitamins daily and trace elements only on MWF due to national shortage.  Standard TPN concentrations of lytes: except increase Na to 125 mEq/L, K at 45, Mag 8, RU:EAVW 1:1  IVF: none  Continue modSSI/CBGs - custom schedule for cyclic TPN  Continue current insulin amount of 40 units in TPN bag for today as CBGs are now much improved compared to previous days  TPN lab panels on Mondays & Thursdays.     Adrian Saran, PharmD, BCPS 02/20/2019 10:31 AM

## 2019-02-20 NOTE — Progress Notes (Signed)
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  On his baseline, is able to ambulate independently with a walker but more recently he was unable to stand on his own. Patient has been started on TPN for failure to thrive.  Oncology was following.  Patient was complaining of persistent abdominal discomfort.  CT abdomen/pelvis done here showed Multifocal fluid collections along the course of the descending and sigmoid colon  consistent with developing abscesses.Started on antibiotics.General surgery/IR following.  6/8: He still has abdominal discomfort.  Rated pain as 4.5 /10.  Plan is to do a CT abdomen/pelvis today.  6/9:CT showed persistent mucosal thickening and enhancement involving the entire colon consistent with pancolitis.Fluid within the bowel wall of the descending colon appears tocommunicate with the distal ileum ileum at the level the sigmoid colon.  General surgery offered  Total colectomy with ileostomy   but patient denied.General surgery now planning for aspiration of the pericolonic fluid.  Eliquis will be held.  Assessment & Plan:   Active Problems:   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HCC)   Diabetes mellitus (HCC)   Burkitt lymphoma, lymph nodes of multiple sites ([place])   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HCC)   Hyponatremia   Malnutrition of moderate degree   Dehydration   Palliative care by specialist   Goals of care, counseling/discussion   Symptomatic anemia   Atrophy of muscle of multiple sites   Depression   Abnormal CT scan, gastrointestinal tract   Intra-abdominal abscess ([place])  Abdominal abscesses: Patient was complaining of persistent abdominal discomfort.  CT  abdomen/pelvis done showed Multifocal fluid collections along the course of the descending and sigmoid colon consistent with developing abscesses.General surgery/IR consulted.Started on zosyn. Negative  blood cultures.Patient is not septic.  Continue Bowel rest. CT abdomen/pelvis as above.  Patient denied colectomy by surgery.  Plan for aspiration of pericolonic fluid.  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  Oncology following.  Currently in remission  History of a small bowel obstruction/ileus: Status post diverting ileostomy on 08/25/2018.  He is having stool output from the ileostomy bag.  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  GI panel, C. difficile negative.  CMV, EBV were negative before .  Elevated ESR and CRP.GI following.New CMV DNA sent,pending .Negativecdiff /pending GI pathogen pane. Continue cholestyramine.  Patient denies short chain fatty acid enema.  Pancytopenia: Multifactorial.  Last Hb is 7.4.  He has been transfused with 3 units of  PRBCs so far.  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  Also has thrombocytopenia.  Failure to thrive/dehydration/electrolyte abnormalities: Due to low oral intake, high ostomy output.  Started on TPN on 02/09/2019.Expected bowel dysfunction for weeks to months.  Continue TPN.  Electrolytes being monitored and supplemented  Diabetes type 2: Continue sliding scale insulin. Monitor CBGs.  History of right lower extremity DVT/bilateral PE: On eliquis.Now held for colonic fluid aspiration  Depression: Continue Celexa  Debility/deconditioning: Plan is to discharge to skilled nursing facility with TPN when medically stable.  He has been accepted at [facility] facility.        Nutrition Problem: Increased nutrient needs Etiology: chronic illness, cancer and cancer related treatments  DVT prophylaxis: Eliquis Code  Status: DNR Family Communication: Discussed with wife on phone on 02/17/2019  disposition Plan: Skilled nursing facility after medical stabilization   Consultants: Hematology/oncology  Procedures: None  Antimicrobials:  Anti-infectives (From admission, onward)   Start     Dose/Rate Route Frequency Ordered Stop   02/14/19 1400  piperacillin-tazobactam (ZOSYN) IVPB 3.375 g     3.375 g 12.5 mL/hr over 240 Minutes Intravenous Every 8 hours 02/14/19 1342        Subjective:  Patient seen and examined the bedside this morning.  Hemodynamically stable.  Continues to complain of abdominal discomfort.  Same as yesterday.    Objective: Vitals:   02/19/19 1551 02/19/19 1833 02/19/19 2029 02/20/19 0522  BP: 112/67 108/72 111/71 111/67  Pulse: 81 75 75 86  Resp: _0 Temp: 98 F (36.7 C) 98.1 F (36.7 C) 98.2 F (36.8 C) 98.6 F (37 C)  TempSrc: Oral Oral Oral Oral  SpO2: 97% 97% 99% 97%  Weight:      Height:        Intake/Output Summary (Last 24 hours) at 02/20/2019 1213 Last data filed at 02/20/2019 1007 Gross per 24 hour  Intake 2493.36 ml  Output 1050 ml  Net 1443.36 ml   Filed Weights   02/08/19 1726  Weight: 69 kg    Examination:   General exam: Not in distress, deconditioned/debilitated  HEENT:PERRL,Oral mucosa moist, Ear/Nose normal on gross exam Respiratory system: Bilateral equal air entry, normal vesicular breath sounds, no wheezes or crackles  Cardiovascular system: S1 & S2 heard, RRR. No JVD, murmurs, rubs, gallops or clicks.  Chemo-Port on the right chest Gastrointestinal system: Abdomen is nondistended, soft .  Tenderness of the abdomen mainly on the left side.  Ileostomy with dark liquidy stool Central nervous system: Alert and oriented. No focal neurological deficits. Extremities: No edema, no clubbing ,no cyanosis, distal peripheral pulses palpable. Skin: No rashes, lesions or ulcers,no icterus ,no pallor      Data Reviewed: I have personally  reviewed following labs and imaging studies  CBC: Recent Labs  Lab 02/14/19 0947 02/16/19 0605 02/19/19 0444  WBC 3.0* 2.7* 3.4*  NEUTROABS 2.4 2.0 2.6  HGB 9.0* 7.5* 7.4*  HCT 29.1* 24.5* 25.2*  MCV 97.0 100.8* 102.0*  PLT 85* 82* 98*   Basic Metabolic Panel: Recent Labs  Lab 02/14/19 0947 02/15/19 0753 02/16/19 0605 02/18/19 0509 02/19/19 0444 02/20/19 0425  NA 133* 138 139 136 134* 132*  K 4.0 4.2 4.3 4.1 4.2 4.0  CL 101 105 108 104 101 101  CO2 _1 GLUCOSE 197* 142* 159* 162* 233* 181*  BUN 18 19 24* 26* 24* 19  CREATININE 0.38* 0.36* 0.42* 0.39* 0.36* 0.36*  CALCIUM 8.1* 8.2* 8.1* 8.3* 8.2* 8.1*  MG 1.6* 2.1 2.0  --  1.9 1.8  PHOS 3.2 3.6  --   --  2.7 2.6   GFR: Estimated Creatinine Clearance: 93.4 mL/min (A) (by C-G formula based on SCr of 0.36 mg/dL (L)). Liver Function Tests: Recent Labs  Lab 02/15/19 0753 02/19/19 0444  AST 11* 12*  ALT 27 25  ALKPHOS 85 92  BILITOT 0.6 <0.1*  PROT 5.2* 5.1*  ALBUMIN 1.9* 1.9*   No results for input(s): LIPASE, AMYLASE in the last 168 hours. No results for input(s): AMMONIA in the last 168 hours. Coagulation Profile: No results for input(s): INR, PROTIME in the last 168 hours. Cardiac Enzymes: No results for input(s):  CKTOTAL, CKMB, CKMBINDEX, TROPONINI in the last 168 hours. BNP (last 3 results) No results for input(s): PROBNP in the last 8760 hours. HbA1C: No results for input(s): HGBA1C in the last 72 hours. CBG: Recent Labs  Lab 02/19/19 0629 02/19/19 1521 02/19/19 2031 02/20/19 0253 02/20/19 0714  GLUCAP 145* 105* 112* 202* 164*   Lipid Profile: Recent Labs    02/19/19 0444  TRIG 98   Thyroid Function Tests: No results for input(s): TSH, T4TOTAL, FREET4, T3FREE, THYROIDAB in the last 72 hours. Anemia Panel: Recent Labs    02/19/19 0946  TIBC 172*  IRON 80   Sepsis Labs: No results for input(s): PROCALCITON, LATICACIDVEN in the last 168 hours.  Recent Results (from the  past 240 hour(s))  Culture, blood (routine x 2)     Status: None   Collection Time: 02/14/19  2:01 PM  Result Value Ref Range Status   Specimen Description   Final    BLOOD RIGHT HAND Performed at [facility] [address] [place], [place] [address]    Special Requests   Final    BOTTLES DRAWN AEROBIC AND ANAEROBIC Blood Culture adequate volume Performed at [facility] [address], [place], [place] [address]    Culture   Final    NO GROWTH 5 DAYS Performed at [facility], [place] [address], [place], [place] [address]    Report Status 02/19/2019 FINAL  Final  Culture, blood (routine x 2)     Status: None   Collection Time: 02/14/19  2:02 PM  Result Value Ref Range Status   Specimen Description   Final    BLOOD RIGHT FOREARM Performed at [facility] [address], [place], [place] [address]    Special Requests   Final    BOTTLES DRAWN AEROBIC AND ANAEROBIC Blood Culture adequate volume Performed at [facility] [address], [place], [place] [address]    Culture   Final    NO GROWTH 5 DAYS Performed at [facility], [place] [address] [place], [place] [address]    Report Status 02/19/2019 FINAL  Final  C difficile quick scan w PCR reflex     Status: None   Collection Time: 02/18/19 11:43 PM  Result Value Ref Range Status   C Diff antigen NEGATIVE NEGATIVE Final   C Diff toxin NEGATIVE NEGATIVE Final   C Diff interpretation No C. difficile detected.  Final    Comment: Performed at [facility], [place] [address] [place], [place] [address]  Gastrointestinal Panel by PCR , Stool     Status: None   Collection Time: 02/18/19 11:43 PM  Result Value Ref Range Status   Campylobacter species NOT DETECTED NOT DETECTED Final   Plesimonas shigelloides NOT DETECTED NOT DETECTED Final   Salmonella species NOT DETECTED NOT DETECTED Final   Yersinia enterocolitica NOT  DETECTED NOT DETECTED Final   Vibrio species NOT DETECTED NOT DETECTED Final   Vibrio cholerae NOT DETECTED NOT DETECTED Final   Enteroaggregative E coli (EAEC) NOT DETECTED NOT DETECTED Final   Enteropathogenic E coli (EPEC) NOT DETECTED NOT DETECTED Final   Enterotoxigenic E coli (ETEC) NOT DETECTED NOT DETECTED Final   Shiga like toxin producing E coli (STEC) NOT DETECTED NOT DETECTED Final   Shigella/Enteroinvasive E coli (EIEC) NOT DETECTED NOT DETECTED Final   Cryptosporidium NOT DETECTED NOT DETECTED Final   Cyclospora cayetanensis NOT DETECTED NOT DETECTED Final   Entamoeba  histolytica NOT DETECTED NOT DETECTED Final   Giardia lamblia NOT DETECTED NOT DETECTED Final   Adenovirus F40/41 NOT DETECTED NOT DETECTED Final   Astrovirus NOT DETECTED NOT DETECTED Final   Norovirus GI/GII NOT DETECTED NOT DETECTED Final   Rotavirus A NOT DETECTED NOT DETECTED Final   Sapovirus (I, II, IV, and V) NOT DETECTED NOT DETECTED Final    Comment: Performed at [facility], [address] [place], [place] [address]         Radiology Studies: Ct Abdomen Pelvis W Contrast  Result Date: 02/19/2019 CLINICAL DATA:  Burkitt's lymphoma. Failure to thrive. Fluid collections along the colon. Post bowel resection EXAM: CT ABDOMEN AND PELVIS WITH CONTRAST TECHNIQUE: Multidetector CT imaging of the abdomen and pelvis was performed using the standard protocol following bolus administration of intravenous contrast. CONTRAST:  124m OMNIPAQUE IOHEXOL 300 MG/ML  SOLN COMPARISON:  CT 02/14/2019 FINDINGS: Lower chest: No pneumonia at the lung bases. Hepatobiliary: No focal hepatic lesion. No hepatic abscess. Gallbladder normal. Pancreas: Pancreas is normal. No ductal dilatation. No pancreatic inflammation. Spleen: Normal spleen Adrenals/urinary tract: Adrenal glands normal. Small hypodense lesions in the kidney likely represent benign cysts no evidence of pyelonephritis. No hydronephrosis bladder normal  Stomach/Bowel: Stomach, duodenum normal. Loop ileostomy in the mid RIGHT abdomen. Towards the terminal ileum the last 6 cm of small bowel demonstrate mucosal enhancement and fluid within lumen. The ascending colon demonstrates thickened nodular mucosa. There is no fluid collection surrounding the ascending colon. The transverse colon is mildly thick-walled with enhancement. The descending colon does demonstrate a pericolonic fluid collection beneath the serosal surface. There is adherence of the descending colon to the lateral abdominal wall in the mid descending colon (image 41/2). This is contiguous with the fluid collection surrounding the descending colon and proximal sigmoid colon which may communicate with the distal ileum at several locations (image 62/5 and image 89/5. Rectum normal. The fluid collection extending along the descending colon is similar to comparison exam. The fluid collection at the junction of the sigmoid colon and small bowel is decreased in volume. There is no clear evidence of extraluminal abscess. No evidence of perforation. Vascular/Lymphatic: Abdominal aorta is normal caliber. No periportal or retroperitoneal adenopathy. No pelvic adenopathy. Reproductive: Prostate normal Other: No free fluid. Musculoskeletal: No aggressive osseous lesion. IMPRESSION: 1. Persistent mucosal thickening and enhancement involving the entire colon consistent with pancolitis. 2. Fluid within the bowel wall of the descending colon appears to communicate with the distal ileum ileum at the level the sigmoid colon. The fluid collection is less prominent at the level of the sigmoid colon compared to exam 5 days prior. 3. Serosal surface of the descending colon is adherent to the lateral abdominal wall peritoneal surface. 4. No intraperitoneal free air.  No extraluminal abscess. Electronically Signed   By: SSuzy BouchardM.D.   On: 02/19/2019 15:56   [place] Site Rite  Result Date: 02/19/2019 If Site Rite  image not attached, placement could not be confirmed due to current cardiac rhythm.       Scheduled Meds: . cholestyramine light  4 g Oral 3 times per day  . citalopram  10 mg Oral Daily  . dexamethasone  1 mg Oral Q breakfast  . insulin aspart  0-15 Units Subcutaneous 4 times per day  . oxandrolone  5 mg Oral BID  . zinc sulfate  220 mg Oral BID   Continuous Infusions: . piperacillin-tazobactam (ZOSYN)  IV 3.375 g (02/20/19 0659)  . TPN  CYCLIC-ADULT (ION)       LOS: 12 days    Time spent: 35 mins.More than 50% of that time was spent in counseling and/or coordination of care.      [name], MD [facility] Pager [phone]  If 7PM-7AM, please contact night-coverage [email or web address] Password [email or web address] 02/20/2019, 12:13 PM

## 2019-02-20 NOTE — Progress Notes (Signed)
Upon removal of patients IV, I formed him that he had some tape on his arm with hair underneath, he became verbally aggressive due to the tape being stuck on the hair on his arm. He stated "get the hell out of my room." Patients IV site was removed and gauze/pressure applied to stop the bleeding. He continued to jerk his arm away from me. Patient continues to state "Get out of here!"  I was able to tape the gauze down, skin is intact where IV site was.

## 2019-02-20 NOTE — Progress Notes (Signed)
Subjective/Chief Complaint: Pt with con't abdominal pain Con't min ostomy output Per IR no drainable fluid  Objective: Vital signs in last 24 hours: Temp:  [98 F (36.7 C)-98.6 F (37 C)] 98.6 F (37 C) (06/09 0522) Pulse Rate:  [75-86] 86 (06/09 0522) Resp:  [12-16] 12 (06/09 0522) BP: (107-112)/(67-72) 111/67 (06/09 0522) SpO2:  [97 %-99 %] 97 % (06/09 0522) Last BM Date: 02/19/19  Intake/Output from previous day: 06/08 0701 - 06/09 0700 In: 2458.9 [P.O.:120; I.V.:1810.7; Blood:354; IV Piggyback:174.2] Out: 1100 [Urine:750; Stool:350] Intake/Output this shift: No intake/output data recorded.  Constitutional: No acute distress, conversant, appears states age. Eyes: Anicteric sclerae, moist conjunctiva, no lid lag Lungs: Clear to auscultation bilaterally, normal respiratory effort CV: regular rate and rhythm, no murmurs, no peripheral edema, pedal pulses 2+ GI: Soft, no masses or hepatosplenomegaly, tender to palpation LLQ, ostomy patent, no rebound/guarding Skin: No rashes, palpation reveals normal turgor Psychiatric: appropriate judgment and insight, oriented to person, place, and time   Lab Results:  Recent Labs    02/19/19 0444  WBC 3.4*  HGB 7.4*  HCT 25.2*  PLT 98*   BMET Recent Labs    02/19/19 0444 02/20/19 0425  NA 134* 132*  K 4.2 4.0  CL 101 101  CO2 23 22  GLUCOSE 233* 181*  BUN 24* 19  CREATININE 0.36* 0.36*  CALCIUM 8.2* 8.1*   Studies/Results: Ct Abdomen Pelvis W Contrast  Result Date: 02/19/2019 CLINICAL DATA:  Burkitt's lymphoma. Failure to thrive. Fluid collections along the colon. Post bowel resection EXAM: CT ABDOMEN AND PELVIS WITH CONTRAST TECHNIQUE: Multidetector CT imaging of the abdomen and pelvis was performed using the standard protocol following bolus administration of intravenous contrast. CONTRAST:  128m OMNIPAQUE IOHEXOL 300 MG/ML  SOLN COMPARISON:  CT 02/14/2019 FINDINGS: Lower chest: No pneumonia at the lung bases.  Hepatobiliary: No focal hepatic lesion. No hepatic abscess. Gallbladder normal. Pancreas: Pancreas is normal. No ductal dilatation. No pancreatic inflammation. Spleen: Normal spleen Adrenals/urinary tract: Adrenal glands normal. Small hypodense lesions in the kidney likely represent benign cysts no evidence of pyelonephritis. No hydronephrosis bladder normal Stomach/Bowel: Stomach, duodenum normal. Loop ileostomy in the mid RIGHT abdomen. Towards the terminal ileum the last 6 cm of small bowel demonstrate mucosal enhancement and fluid within lumen. The ascending colon demonstrates thickened nodular mucosa. There is no fluid collection surrounding the ascending colon. The transverse colon is mildly thick-walled with enhancement. The descending colon does demonstrate a pericolonic fluid collection beneath the serosal surface. There is adherence of the descending colon to the lateral abdominal wall in the mid descending colon (image 41/2). This is contiguous with the fluid collection surrounding the descending colon and proximal sigmoid colon which may communicate with the distal ileum at several locations (image 62/5 and image 89/5. Rectum normal. The fluid collection extending along the descending colon is similar to comparison exam. The fluid collection at the junction of the sigmoid colon and small bowel is decreased in volume. There is no clear evidence of extraluminal abscess. No evidence of perforation. Vascular/Lymphatic: Abdominal aorta is normal caliber. No periportal or retroperitoneal adenopathy. No pelvic adenopathy. Reproductive: Prostate normal Other: No free fluid. Musculoskeletal: No aggressive osseous lesion. IMPRESSION: 1. Persistent mucosal thickening and enhancement involving the entire colon consistent with pancolitis. 2. Fluid within the bowel wall of the descending colon appears to communicate with the distal ileum ileum at the level the sigmoid colon. The fluid collection is less prominent at  the level of the sigmoid colon compared  to exam 5 days prior. 3. Serosal surface of the descending colon is adherent to the lateral abdominal wall peritoneal surface. 4. No intraperitoneal free air.  No extraluminal abscess. Electronically Signed   By: Suzy Bouchard M.D.   On: 02/19/2019 15:56   [place] Ekg Site Rite  Result Date: 02/19/2019 If Site Rite image not attached, placement could not be confirmed due to current cardiac rhythm.   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 Dehydration PCM - prealbumin 6.5>>7.7>>16 - on TPN Palliative care - DNR Pancytopenia  FTT/ severe deconditioning  - dehydration/electrolyte abnormalities  Type II diabetes Depression   Descending and sigmoid colitis with fluid collections - IRdoesn't feel it is drainable based on CT scan 6/3 -GI panel neg for infectious origin Hx of radiation colitis/proctitis 01/01/19 Burkitt's lymphoma with chemotherapy & radiation therapy - ongoing  FEN:Con'tNPO/TPN ID: Zosyn 6/3>> DVT: SCD's, Eliquis Follow up: TBD POC: Litts,[name] Spouse [phone]  [phone]   Plan: -Pt with min overall improvement of his colitis.  This may not resolve with medical treatment.  D/w patient if he would consider surgery, total colectomy with ileostomy, and he states "it would not be my preference."   -Cont abx treatment  -Con't TNA  LOS: 12 days    Ralene Ok 02/20/2019

## 2019-02-20 NOTE — Progress Notes (Signed)
Referring Physician(s): Gross,S  Supervising Physician: Aletta Edouard  Patient Status:  [facility] [place] - In-pt  Chief Complaint: Abdominal pain   Subjective: Patient familiar to IR service from peritoneal mass biopsy on 03/13/2018, bone marrow biopsy as well as Port-A-Cath placement on 03/17/2018.  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  Latest CT scan of abdomen pelvis yesterday revealed persistent mucosal thickening and enhancement involving the entire colon consistent with pancolitis, fluid within the bowel wall of the descending colon which appears to communicate with the distal ileum at the level of the sigmoid colon.  There are multifocal fluid collections along the course of the descending and sigmoid colon but overall improved since prior CT. Patient is afebrile with WBC 3.4, hemoglobin 7.4, platelets 98K.  He is COVID-19 negative.  Request received from surgery for CT-guided aspiration of the left pericolonic fluid for further evaluation/culture.  Past Medical History:  Diagnosis Date  . ALLERGIC RHINITIS   . Cancer ([place])    Lymphoma   . Diabetes mellitus   . Hyperlipidemia    Past Surgical History:  Procedure Laterality Date  . BIOPSY  03/15/2018   Procedure: BIOPSY;  Surgeon: Milus Banister, MD;  Location: [name];  Service: Endoscopy;;  . BOWEL RESECTION N/A 08/25/2018   Procedure: LAPROSCOPIC LOOP ILEOSTOMY;  Surgeon: Greer Pickerel, MD;  Location: [facility];  Service: General;  Laterality: N/A;  . CLEFT PALATE REPAIR    . COLONOSCOPY N/A 03/15/2018   Procedure: COLONOSCOPY;  Surgeon: Milus Banister, MD;  Location: [facility];  Service: Endoscopy;  Laterality: N/A;  . COLONOSCOPY N/A 08/20/2018   Procedure: COLONOSCOPY;  Surgeon: Irene Shipper, MD;  Location: [facility];  Service: Endoscopy;   Laterality: N/A;  . deviated septum repair     slight improvement  . ESOPHAGOGASTRODUODENOSCOPY N/A 03/15/2018   Procedure: ESOPHAGOGASTRODUODENOSCOPY (EGD);  Surgeon: Milus Banister, MD;  Location: [name];  Service: Endoscopy;  Laterality: N/A;  . IR IMAGING GUIDED PORT INSERTION  03/17/2018  . LAPAROSCOPY N/A 08/25/2018   Procedure: LAPAROSCOPY DIAGNOSTIC;  Surgeon: Greer Pickerel, MD;  Location: [facility];  Service: General;  Laterality: N/A;  . TONSILLECTOMY        Allergies: Ciprofloxacin  Medications: Prior to Admission medications   Medication Sig Start Date End Date Taking? Authorizing Provider  apixaban (ELIQUIS) 5 MG TABS tablet DO NOT TAKE UNTIL SEEN BY YOUR ONCOLOGIST Patient taking differently: Take 2.5 mg by mouth 2 (two) times daily. DO NOT TAKE UNTIL SEEN BY YOUR ONCOLOGIST 01/16/19  Yes Oretha Milch D, MD  dexamethasone (DECADRON) 4 MG tablet Take 1 tablet (4 mg total) by mouth daily with breakfast. 01/17/19  Yes Oretha Milch D, MD  dronabinol (MARINOL) 5 MG capsule Take 1 capsule (5 mg total) by mouth 2 (two) times daily before lunch and supper. 01/16/19  Yes Oretha Milch D, MD  Magnesium 500 MG CAPS Take 1 capsule (500 mg total) by mouth 2 (two) times daily. Patient taking differently: Take 500 mg by mouth 2 (two) times a day.  06/27/18  Yes [name], MD  ondansetron (ZOFRAN) 4 MG tablet Take 1 tablet (4 mg total) by mouth every 6 (six) hours as needed for nausea. 01/16/19  Yes Oretha Milch D, MD  Amino Acids-Protein Hydrolys (FEEDING SUPPLEMENT, PRO-STAT SUGAR FREE 64,) LIQD Take 30 mLs by  mouth 2 (two) times daily. Patient not taking: Reported on 02/07/2019 01/16/19   Desiree Hane, MD  blood glucose meter kit and supplies Dispense based on patient and insurance preference. Use daily as directed. (E11.9). 03/31/18   [name], MD  feeding supplement, ENSURE ENLIVE, (ENSURE ENLIVE) LIQD Take 237 mLs by mouth 2 (two) times daily between meals.  Patient not taking: Reported on 02/07/2019 01/16/19   Oretha Milch D, MD  glucose blood (FREESTYLE TEST STRIPS) test strip Use to check blood sugar daily 03/30/18   [name], MD     Vital Signs: BP 113/73 (BP Location: Right Arm)   Pulse 80   Temp 98.4 F (36.9 C) (Oral)   Resp 16   Ht _0  (1.753 m)   Wt 152 lb 1.9 oz (69 kg)   SpO2 97%   BMI 22.46 kg/m   Physical Exam:  awake, alert.  Chest clear to auscultation bilaterally.  Clean, intact right chest wall Port-A-Cath.  Heart with regular rate and rhythm.  Abdomen soft, ostomy intact, positive bowel sounds, mildly tender left lateral abdominal region to palpation.  Trace to 1+ pretibial edema bilaterally.  Imaging: Ct Abdomen Pelvis W Contrast  Result Date: 02/19/2019 CLINICAL DATA:  Burkitt's lymphoma. Failure to thrive. Fluid collections along the colon. Post bowel resection EXAM: CT ABDOMEN AND PELVIS WITH CONTRAST TECHNIQUE: Multidetector CT imaging of the abdomen and pelvis was performed using the standard protocol following bolus administration of intravenous contrast. CONTRAST:  159m OMNIPAQUE IOHEXOL 300 MG/ML  SOLN COMPARISON:  CT 02/14/2019 FINDINGS: Lower chest: No pneumonia at the lung bases. Hepatobiliary: No focal hepatic lesion. No hepatic abscess. Gallbladder normal. Pancreas: Pancreas is normal. No ductal dilatation. No pancreatic inflammation. Spleen: Normal spleen Adrenals/urinary tract: Adrenal glands normal. Small hypodense lesions in the kidney likely represent benign cysts no evidence of pyelonephritis. No hydronephrosis bladder normal Stomach/Bowel: Stomach, duodenum normal. Loop ileostomy in the mid RIGHT abdomen. Towards the terminal ileum the last 6 cm of small bowel demonstrate mucosal enhancement and fluid within lumen. The ascending colon demonstrates thickened nodular mucosa. There is no fluid collection surrounding the ascending colon. The transverse colon is mildly thick-walled with enhancement. The  descending colon does demonstrate a pericolonic fluid collection beneath the serosal surface. There is adherence of the descending colon to the lateral abdominal wall in the mid descending colon (image 41/2). This is contiguous with the fluid collection surrounding the descending colon and proximal sigmoid colon which may communicate with the distal ileum at several locations (image 62/5 and image 89/5. Rectum normal. The fluid collection extending along the descending colon is similar to comparison exam. The fluid collection at the junction of the sigmoid colon and small bowel is decreased in volume. There is no clear evidence of extraluminal abscess. No evidence of perforation. Vascular/Lymphatic: Abdominal aorta is normal caliber. No periportal or retroperitoneal adenopathy. No pelvic adenopathy. Reproductive: Prostate normal Other: No free fluid. Musculoskeletal: No aggressive osseous lesion. IMPRESSION: 1. Persistent mucosal thickening and enhancement involving the entire colon consistent with pancolitis. 2. Fluid within the bowel wall of the descending colon appears to communicate with the distal ileum ileum at the level the sigmoid colon. The fluid collection is less prominent at the level of the sigmoid colon compared to exam 5 days prior. 3. Serosal surface of the descending colon is adherent to the lateral abdominal wall peritoneal surface. 4. No intraperitoneal free air.  No extraluminal abscess. Electronically Signed   By: SSuzy Bouchard  M.D.   On: 02/19/2019 15:56   [place] Ekg Site Rite  Result Date: 02/19/2019 If Site Rite image not attached, placement could not be confirmed due to current cardiac rhythm.   Labs:  CBC: Recent Labs    02/13/19 0546 02/14/19 0947 02/16/19 0605 02/19/19 0444  WBC 2.5* 3.0* 2.7* 3.4*  HGB 7.7* 9.0* 7.5* 7.4*  HCT 25.0* 29.1* 24.5* 25.2*  PLT 90* 85* 82* 98*    COAGS: Recent Labs    05/23/18 0545 06/12/18 1123  07/30/18 2353  08/19/18 2257 08/20/18  1021 08/21/18 0418 01/08/19 1326  INR 0.92 0.92  --  1.20  --   --   --   --  1.2  APTT 21* 24   < > 28   < > 189* 76* 81* 26   < > = values in this interval not displayed.    BMP: Recent Labs    02/16/19 0605 02/18/19 0509 02/19/19 0444 02/20/19 0425  NA 139 136 134* 132*  K 4.3 4.1 4.2 4.0  CL 108 104 101 101  CO2 _0 GLUCOSE 159* 162* 233* 181*  BUN 24* 26* 24* 19  CALCIUM 8.1* 8.3* 8.2* 8.1*  CREATININE 0.42* 0.39* 0.36* 0.36*  GFRNONAA >60 >60 >60 >60  GFRAA >60 >60 >60 >60    LIVER FUNCTION TESTS: Recent Labs    02/12/19 0415 02/13/19 0546 02/15/19 0753 02/19/19 0444  BILITOT 0.4 0.3 0.6 <0.1*  AST 14* 22 11* 12*  ALT 36 38 27 25  ALKPHOS 107 101 85 92  PROT 4.9* 4.9* 5.2* 5.1*  ALBUMIN 1.8* 1.8* 1.9* 1.9*    Assessment and Plan: Pt with history of Burkitt's lymphoma, status post chemoradiation as well as ileostomy, diabetes, radiation colitis, 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  Latest CT scan of abdomen pelvis yesterday revealed persistent mucosal thickening and enhancement involving the entire colon consistent with pancolitis, fluid within the bowel wall of the descending colon which appears to communicate with the distal ileum at the level of the sigmoid colon.  There are multifocal fluid collections along the course of the descending and sigmoid colon but overall improved since prior CT. Patient is afebrile with WBC 3.4, hemoglobin 7.4, platelets 98K.  He is COVID-19 negative.  Request received from surgery for CT-guided aspiration of the left pericolonic fluid for further evaluation/culture.  Latest imaging studies have been reviewed by Dr. Kathlene Cote.  Will attempt CT-guided aspiration of left pericolonic fluid for culture on 6/11.  Eliquis should be held until after the above procedure.  Details/risks of procedure, including but not limited to, internal bleeding, infection, injury to  adjacent structures discussed with patient with his understanding and consent.   Electronically Signed: D. Rowe Robert, PA-C 02/20/2019, 2:56 PM   I spent a total of 20 minutes at the the patient's bedside AND on the patient's hospital floor or unit, greater than 50% of which was counseling/coordinating care for CT-guided aspiration of left pericolonic fluid collection    Patient ID: Mark Frey, male   DOB: Jan 03, 1956, 63 y.o.   MRN: [id number]

## 2019-02-21 ENCOUNTER — Telehealth: Payer: Self-pay | Admitting: Gastroenterology

## 2019-02-21 DIAGNOSIS — D696 Thrombocytopenia, unspecified: Secondary | ICD-10-CM

## 2019-02-21 LAB — CBC WITH DIFFERENTIAL/PLATELET
Abs Immature Granulocytes: 0.2 10*3/uL — ABNORMAL HIGH (ref 0.00–0.07)
Basophils Absolute: 0 10*3/uL (ref 0.0–0.1)
Basophils Relative: 1 %
Eosinophils Absolute: 0.1 10*3/uL (ref 0.0–0.5)
Eosinophils Relative: 1 %
HCT: 26.8 % — ABNORMAL LOW (ref 39.0–52.0)
Hemoglobin: 8.2 g/dL — ABNORMAL LOW (ref 13.0–17.0)
Immature Granulocytes: 5 %
Lymphocytes Relative: 9 %
Lymphs Abs: 0.3 10*3/uL — ABNORMAL LOW (ref 0.7–4.0)
MCH: 30.5 pg (ref 26.0–34.0)
MCHC: 30.6 g/dL (ref 30.0–36.0)
MCV: 99.6 fL (ref 80.0–100.0)
Monocytes Absolute: 0.4 10*3/uL (ref 0.1–1.0)
Monocytes Relative: 11 %
Neutro Abs: 2.7 10*3/uL (ref 1.7–7.7)
Neutrophils Relative %: 73 %
Platelets: 110 10*3/uL — ABNORMAL LOW (ref 150–400)
RBC: 2.69 MIL/uL — ABNORMAL LOW (ref 4.22–5.81)
RDW: 17.9 % — ABNORMAL HIGH (ref 11.5–15.5)
WBC: 3.7 10*3/uL — ABNORMAL LOW (ref 4.0–10.5)
nRBC: 0 % (ref 0.0–0.2)

## 2019-02-21 LAB — GLUCOSE, CAPILLARY
Glucose-Capillary: 195 mg/dL — ABNORMAL HIGH (ref 70–99)
Glucose-Capillary: 150 mg/dL — ABNORMAL HIGH (ref 70–99)
Glucose-Capillary: 93 mg/dL (ref 70–99)
Glucose-Capillary: 113 mg/dL — ABNORMAL HIGH (ref 70–99)
Glucose-Capillary: 114 mg/dL — ABNORMAL HIGH (ref 70–99)
Glucose-Capillary: 221 mg/dL — ABNORMAL HIGH (ref 70–99)

## 2019-02-21 LAB — BASIC METABOLIC PANEL
Anion gap: 6 (ref 5–15)
BUN: 21 mg/dL (ref 8–23)
CO2: 26 mmol/L (ref 22–32)
Calcium: 8.1 mg/dL — ABNORMAL LOW (ref 8.9–10.3)
Chloride: 102 mmol/L (ref 98–111)
Creatinine, Ser: 0.35 mg/dL — ABNORMAL LOW (ref 0.61–1.24)
GFR calc Af Amer: >60 mL/min (ref >60)
GFR calc non Af Amer: >60 mL/min (ref >60)
Glucose, Bld: 187 mg/dL — ABNORMAL HIGH (ref 70–99)
Potassium: 4.2 mmol/L (ref 3.5–5.1)
Sodium: 134 mmol/L — ABNORMAL LOW (ref 135–145)

## 2019-02-21 LAB — PHOSPHORUS: Phosphorus: 2.6 mg/dL (ref 2.5–4.6)

## 2019-02-21 LAB — MAGNESIUM: Magnesium: 1.8 mg/dL (ref 1.7–2.4)

## 2019-02-21 LAB — CALCIUM, IONIZED: Calcium, Ionized, Serum: 5 mg/dL (ref 4.5–5.6)

## 2019-02-21 MED ORDER — TRAVASOL 10 % IV SOLN
INTRAVENOUS | Status: AC
  Administered 2019-02-21: 18:00:00 via INTRAVENOUS
  Filled 2019-02-21: qty 1254

## 2019-02-21 NOTE — Progress Notes (Signed)
[facility] Progress Note     Subjective: CC-  No better or worse. Continues to complain of diffuse abdominal pain, most severe in the LLQ. Denies n/v. Ostomy functioning, 525cc out last 24 hours. Trace blood noted in stool this morning.  Objective: Vital signs in last 24 hours: Temp:  [97.7 F (36.5 C)-98.5 F (36.9 C)] 98.5 F (36.9 C) (06/10 0636) Pulse Rate:  [80-89] 89 (06/10 0636) Resp:  [12-16] 14 (06/10 0636) BP: (108-113)/(71-74) 108/71 (06/10 0636) SpO2:  [97 %] 97 % (06/10 0636) Last BM Date: 02/21/19  Intake/Output from previous day: 06/09 0701 - 06/10 0700 In: 429.9 [P.O.:360; I.V.:20; IV Piggyback:49.9] Out: 2250 [Urine:1725; Stool:525] Intake/Output this shift: Total I/O In: 141.7 [IV Piggyback:141.7] Out: -   PE: Gen:  Alert, NAD, cooperative HEENT: EOM's intact, pupils equal and round Pulm:  effort normal Abd: Soft, nondistended, mild diffuse TTP with most significant pain in LLQ, no peritonitis, ostomy viable with air and green/bright red blood-tinged liquid stool in bag Skin: warm and dry   Lab Results:  Recent Labs    02/19/19 0444 02/21/19 0504  WBC 3.4* 3.7*  HGB 7.4* 8.2*  HCT 25.2* 26.8*  PLT 98* 110*   BMET Recent Labs    02/20/19 0425 02/21/19 0504  NA 132* 134*  K 4.0 4.2  CL 101 102  CO2 22 26  GLUCOSE 181* 187*  BUN 19 21  CREATININE 0.36* 0.35*  CALCIUM 8.1* 8.1*   PT/INR No results for input(s): LABPROT, INR in the last 72 hours. CMP     Component Value Date/Time   NA 134 (L) 02/21/2019 0504   K 4.2 02/21/2019 0504   CL 102 02/21/2019 0504   CO2 26 02/21/2019 0504   GLUCOSE 187 (H) 02/21/2019 0504   BUN 21 02/21/2019 0504   CREATININE 0.35 (L) 02/21/2019 0504   CREATININE 0.78 01/25/2019 1050   CREATININE 1.26 12/01/2012 1700   CALCIUM 8.1 (L) 02/21/2019 0504   PROT 5.1 (L) 02/19/2019 0444   ALBUMIN 1.9 (L) 02/19/2019 0444   AST 12 (L) 02/19/2019 0444   AST 18 01/25/2019 1050   ALT 25  02/19/2019 0444   ALT 59 (H) 01/25/2019 1050   ALKPHOS 92 02/19/2019 0444   BILITOT <0.1 (L) 02/19/2019 0444   BILITOT 0.6 01/25/2019 1050   GFRNONAA >60 02/21/2019 0504   GFRNONAA >60 01/25/2019 1050   GFRAA >60 02/21/2019 0504   GFRAA >60 01/25/2019 1050   Lipase     Component Value Date/Time   LIPASE 19 08/17/2018 1738       Studies/Results: Ct Abdomen Pelvis W Contrast  Result Date: 02/19/2019 CLINICAL DATA:  Burkitt's lymphoma. Failure to thrive. Fluid collections along the colon. Post bowel resection EXAM: CT ABDOMEN AND PELVIS WITH CONTRAST TECHNIQUE: Multidetector CT imaging of the abdomen and pelvis was performed using the standard protocol following bolus administration of intravenous contrast. CONTRAST:  162m OMNIPAQUE IOHEXOL 300 MG/ML  SOLN COMPARISON:  CT 02/14/2019 FINDINGS: Lower chest: No pneumonia at the lung bases. Hepatobiliary: No focal hepatic lesion. No hepatic abscess. Gallbladder normal. Pancreas: Pancreas is normal. No ductal dilatation. No pancreatic inflammation. Spleen: Normal spleen Adrenals/urinary tract: Adrenal glands normal. Small hypodense lesions in the kidney likely represent benign cysts no evidence of pyelonephritis. No hydronephrosis bladder normal Stomach/Bowel: Stomach, duodenum normal. Loop ileostomy in the mid RIGHT abdomen. Towards the terminal ileum the last 6 cm of small bowel demonstrate mucosal enhancement and fluid within lumen. The ascending colon demonstrates thickened nodular  mucosa. There is no fluid collection surrounding the ascending colon. The transverse colon is mildly thick-walled with enhancement. The descending colon does demonstrate a pericolonic fluid collection beneath the serosal surface. There is adherence of the descending colon to the lateral abdominal wall in the mid descending colon (image 41/2). This is contiguous with the fluid collection surrounding the descending colon and proximal sigmoid colon which may communicate  with the distal ileum at several locations (image 62/5 and image 89/5. Rectum normal. The fluid collection extending along the descending colon is similar to comparison exam. The fluid collection at the junction of the sigmoid colon and small bowel is decreased in volume. There is no clear evidence of extraluminal abscess. No evidence of perforation. Vascular/Lymphatic: Abdominal aorta is normal caliber. No periportal or retroperitoneal adenopathy. No pelvic adenopathy. Reproductive: Prostate normal Other: No free fluid. Musculoskeletal: No aggressive osseous lesion. IMPRESSION: 1. Persistent mucosal thickening and enhancement involving the entire colon consistent with pancolitis. 2. Fluid within the bowel wall of the descending colon appears to communicate with the distal ileum ileum at the level the sigmoid colon. The fluid collection is less prominent at the level of the sigmoid colon compared to exam 5 days prior. 3. Serosal surface of the descending colon is adherent to the lateral abdominal wall peritoneal surface. 4. No intraperitoneal free air.  No extraluminal abscess. Electronically Signed   By: Suzy Bouchard M.D.   On: 02/19/2019 15:56    Anti-infectives: Anti-infectives (From admission, onward)   Start     Dose/Rate Route Frequency Ordered Stop   02/14/19 1400  piperacillin-tazobactam (ZOSYN) IVPB 3.375 g     3.375 g 12.5 mL/hr over 240 Minutes Intravenous Every 8 hours 02/14/19 1342         Assessment/Plan Hx PE/DVT - on Eliquis (last dose 6/9) Dehydration PCM - prealbumin 6.5>>7.7>>16- on TPN Palliative care - DNR Pancytopenia  FTT/ severe deconditioning  - dehydration/electrolyte abnormalities  Type II diabetes Depression   Descending and sigmoid colitis with fluid collections - IRdoesn't feel it is drainablebased on CT scan 6/3 - GI panel and c diff neg for infectious origin - Dr. 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 Continue TPN. IR for peritoneal fluid aspiration tomorrow.     LOS: 13 days    Wellington [place] , [facility] 02/21/2019, 11:25 AM Pager: [phone] Mon-Thurs 7:00 am-4:30 pm Fri 7:00 am -11:30 AM Sat-Sun 7:00 am-11:30 am

## 2019-02-21 NOTE — Progress Notes (Signed)
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 Usual Weight: 190 lb - 15% weight loss in last month per RD  Current Nutrition: NPO, TPN  IVF: none - D5W d/c'd 6/8 AM  Central access: Port TPN start date: 5/29  ASSESSMENT                                                                                                          HPI: 63 yo male with burkitt's lymphoma s/p chemo now with severe radiation colitis, prolonged malnutrition, multiple recent admissions to start TPN per pharmacy management  Significant events:  6/3 repeat CT abd/pelvis> Descending and sigmoid colitis with fluid collections, not drainable by IR, Zosyn added, made NPO 6/9: apixaban d/c'd for plan for endoscopic eval in next few days. And possibly colonic resection in future per GI notes  Today, 02/21/19  Glucose - goal < 888 while on cyclic TPN (tolerate higher goal CBGs while on cyclic) - 12 Units LNZ/97 hours;   CBGs 174 and 195 during 12 hr cylcle of TPN w/ 40 units of insulin.  CBGs while TPN off 164, 108 and 93 1 hr after TPN turned off this AM. Note hx DM and chronic dexamethasone 2 mg qday > decreased to 1 mg/day 6/2.  Electrolytes - WNL, except Na 134 but rising  Renal - WNL  LFTs - AST/ALT stable/WNL  TGs - 71 (6/1), 98 (6/8)  Prealbumin - 7.7 (6/1), 16 (6/8) oxandrin added 5/29, dose increased 6/1  NUTRITIONAL GOALS                                                                                             RD recs: 2300-2500 kcal/day, 115-125g protein/day  Custom TPN at goal of 95 ml/hr over 28ASU or 12 hr cyclic administration getting 2280 ml to provide: 125 g/day  protein, 2270Kcal/day.  PLAN  At 1800 today:  Continue custom cyclic TPN with 3762 mls/day over 12 hours = full support  TPN to contain standard multivitamins daily and trace elements only on MWF due to national shortage.  Standard TPN concentrations of lytes: Na at 125 mEq/L, K at 45, Mag 8, GB:TDVV 1:1  IVF: none  Continue modSSI/CBGs - custom schedule for cyclic TPN  Continue current insulin amount of 40 units in TPN bag for today as CBGs are now much improved compared to previous days  TPN lab panels on Mondays & Thursdays.     Adrian Saran, PharmD, BCPS 02/21/2019 10:08 AM

## 2019-02-21 NOTE — Progress Notes (Signed)
Pt has refused multiple times during this shift to be turned, cleaned up or ambulate. Pt educated on the importance. States "I'm good".

## 2019-02-21 NOTE — Progress Notes (Signed)
PT Cancellation Note  Patient Details Name: Mark Frey MRN: [id number] DOB: 03/30/1956   Cancelled Treatment:    Reason Eval/Treat Not Completed: Patient declined, no reason specified pt adamantly refuses any all movement, including exercises in the bed, EOB, etc. He will not cite a reason, only repeats "I am good"    [facility] 02/21/2019, 10:25 AM

## 2019-02-21 NOTE — Progress Notes (Addendum)
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  On his baseline, is able to ambulate independently with a walker but more recently he was unable to stand on his own. Patient has been started on TPN for failure to thrive.  Oncology was following.  Patient was complaining of persistent abdominal discomfort.  CT abdomen/pelvis done here showed Multifocal fluid collections along the course of the descending and sigmoid colon  consistent with developing abscesses.Started on antibiotics.General surgery/IR following.  6/8: He still has abdominal discomfort.  Rated pain as 4.5 /10.  Plan is to do a CT abdomen/pelvis today.  6/9:CT showed persistent mucosal thickening and enhancement involving the entire colon consistent with pancolitis.Fluid within the bowel wall of the descending colon appears tocommunicate with the distal ileum ileum at the level the sigmoid colon.  General surgery offered  Total colectomy with ileostomy   but patient denied.General surgery now planning for aspiration of the pericolonic fluid.  Eliquis will be held.  6/10: Abdominal pain persist.  Plan for CT-guided aspiration of pericolonic fluid tomorrow by IR.  Assessment & Plan:   Active Problems:   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HCC)   Diabetes mellitus (HCC)   Burkitt lymphoma, lymph nodes of multiple sites ([place])   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HCC)   Hyponatremia   Malnutrition of moderate degree   Dehydration   Palliative care by specialist   Goals of care, counseling/discussion   Symptomatic anemia   Atrophy of muscle of multiple sites   Depression   Abnormal CT scan, gastrointestinal tract   Intra-abdominal  abscess (HCC)   Thrombocytopenia (HCC)  Abdominal abscesses: Patient was complaining of persistent abdominal discomfort.  CT abdomen/pelvis done showed Multifocal fluid collections along the course of the descending and sigmoid colon consistent with developing abscesses.General surgery/IR consulted.Started on zosyn. Negative  blood cultures.Patient is not septic. On full liquid diet. CT abdomen/pelvis as above.  Patient denied colectomy by surgery.  Plan for aspiration of pericolonic fluid tomorrow.  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  Oncology following.  Currently in remission  History of a small bowel obstruction/ileus: Status post diverting ileostomy on 08/25/2018.  He is having stool output from the ileostomy bag.  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  GI panel, C. difficile negative.    Elevated ESR and CRP.GI following.CMV DNA negative .Negative cdiff /pending GI pathogen panel. Continue cholestyramine.  GI planning for  short chain fatty acid enema but patient is reluctant .  Pancytopenia: Multifactorial. Hb this morning is 8.2.  He has been transfused with 3 units of  PRBCs so far.  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  Also has thrombocytopenia.  Failure to thrive/dehydration/electrolyte abnormalities: Due to low oral intake, high ostomy output.  Started on TPN on 02/09/2019.Expected bowel dysfunction for weeks to months.  Continue TPN.  Electrolytes being monitored and supplemented  Diabetes type 2: Continue sliding scale insulin. Monitor CBGs.  History of right lower extremity DVT/bilateral PE: On eliquis.Now held for colonic fluid aspiration  Depression: Continue Celexa  Debility/deconditioning: Plan is to discharge to skilled nursing facility with TPN when medically stable.He has a PICC line line on left UE  He has  been accepted at [facility]  facility.        Nutrition Problem: Increased nutrient needs Etiology: chronic illness, cancer and cancer related treatments      DVT prophylaxis: Eliquis Code Status: DNR Family Communication: Discussed with wife on phone on 02/21/2019 .  Patient does not talk to his wife.  But wife wants daily update!!  Disposition Plan: Skilled nursing facility after medical stabilization   Consultants: Hematology/oncology  Procedures: None  Antimicrobials:  Anti-infectives (From admission, onward)   Start     Dose/Rate Route Frequency Ordered Stop   02/14/19 1400  piperacillin-tazobactam (ZOSYN) IVPB 3.375 g     3.375 g 12.5 mL/hr over 240 Minutes Intravenous Every 8 hours 02/14/19 1342        Subjective:  Patient seen and examined the bedside this morning.  No new events.  Still complains of abdominal discomfort.  Lying in the bed.  Awaiting CT aspiration of colonic fluid tomorrow  Objective: Vitals:   02/20/19 0522 02/20/19 1414 02/20/19 2017 02/21/19 0636  BP: 111/67 113/73 112/74 108/71  Pulse: 86 80 88 89  Resp: 12 16 12 14   Temp: 98.6 F (37 C) 98.4 F (36.9 C) 97.7 F (36.5 C) 98.5 F (36.9 C)  TempSrc: Oral Oral Oral Oral  SpO2: 97% 97% 97% 97%  Weight:      Height:        Intake/Output Summary (Last 24 hours) at 02/21/2019 1249 Last data filed at 02/21/2019 1000 Gross per 24 hour  Intake 477.21 ml  Output 2000 ml  Net -1522.79 ml   Filed Weights   02/08/19 1726  Weight: 69 kg    Examination:  General exam:  ,Not in distress, deconditioned/debilitated HEENT:PERRL,Oral mucosa moist, Ear/Nose normal on gross exam Respiratory system: Bilateral equal air entry, normal vesicular breath sounds, no wheezes or crackles  Cardiovascular system: S1 & S2 heard, RRR. No JVD, murmurs, rubs, gallops or clicks.  Chemo-Port on the right chest Gastrointestinal system: Abdomen is nondistended, soft .tenderness on the left side.  Ileostomy with dark liquidy stool  Central  nervous system: Alert and oriented. No focal neurological deficits. Extremities: No edema, no clubbing ,no cyanosis, distal peripheral pulses palpable. Skin: No rashes, lesions ,no icterus ,no pallor Stage 2 sacral decubitus   Data Reviewed: I have personally reviewed following labs and imaging studies  CBC: Recent Labs  Lab 02/16/19 0605 02/19/19 0444 02/21/19 0504  WBC 2.7* 3.4* 3.7*  NEUTROABS 2.0 2.6 2.7  HGB 7.5* 7.4* 8.2*  HCT 24.5* 25.2* 26.8*  MCV 100.8* 102.0* 99.6  PLT 82* 98* 147*   Basic Metabolic Panel: Recent Labs  Lab 02/15/19 0753 02/16/19 0605 02/18/19 0509 02/19/19 0444 02/20/19 0425 02/21/19 0504  NA 138 139 136 134* 132* 134*  K 4.2 4.3 4.1 4.2 4.0 4.2  CL 105 108 104 101 101 102  CO2 27 26 25 23 22 26   GLUCOSE 142* 159* 162* 233* 181* 187*  BUN 19 24* 26* 24* 19 21  CREATININE 0.36* 0.42* 0.39* 0.36* 0.36* 0.35*  CALCIUM 8.2* 8.1* 8.3* 8.2* 8.1* 8.1*  MG 2.1 2.0  --  1.9 1.8 1.8  PHOS 3.6  --   --  2.7 2.6 2.6   GFR: Estimated Creatinine Clearance: 93.4 mL/min (A) (by C-G formula based on SCr of 0.35 mg/dL (L)). Liver Function Tests: Recent Labs  Lab 02/15/19 0753 02/19/19 0444  AST 11* 12*  ALT 27 25  ALKPHOS 85 92  BILITOT 0.6 <0.1*  PROT 5.2* 5.1*  ALBUMIN 1.9* 1.9*   No results for input(s): LIPASE, AMYLASE in the last 168 hours. No results for input(s): AMMONIA in the last 168 hours. Coagulation Profile: No results for input(s): INR, PROTIME in the last 168 hours. Cardiac Enzymes: No results for input(s): CKTOTAL, CKMB, CKMBINDEX, TROPONINI in the last 168 hours. BNP (last 3 results) No results for input(s): PROBNP in the last 8760 hours. HbA1C: No results for input(s): HGBA1C in the last 72 hours. CBG: Recent Labs  Lab 02/20/19 2014 02/21/19 0202 02/21/19 0638 02/21/19 0802 02/21/19 1147  GLUCAP 174* 195* 150* 93 113*   Lipid Profile: Recent Labs    02/19/19 0444  TRIG 98   Thyroid Function Tests: No results  for input(s): TSH, T4TOTAL, FREET4, T3FREE, THYROIDAB in the last 72 hours. Anemia Panel: Recent Labs    02/19/19 0946  TIBC 172*  IRON 80   Sepsis Labs: No results for input(s): PROCALCITON, LATICACIDVEN in the last 168 hours.  Recent Results (from the past 240 hour(s))  Culture, blood (routine x 2)     Status: None   Collection Time: 02/14/19  2:01 PM  Result Value Ref Range Status   Specimen Description   Final    BLOOD RIGHT HAND Performed at [facility] [address] [place], [place] [address]    Special Requests   Final    BOTTLES DRAWN AEROBIC AND ANAEROBIC Blood Culture adequate volume Performed at [facility] [address], [place], [place] [address]    Culture   Final    NO GROWTH 5 DAYS Performed at [facility], [place] [address], [place], [place] [address]    Report Status 02/19/2019 FINAL  Final  Culture, blood (routine x 2)     Status: None   Collection Time: 02/14/19  2:02 PM  Result Value Ref Range Status   Specimen Description   Final    BLOOD RIGHT FOREARM Performed at [facility] [address] [place], [place] [address]    Special Requests   Final    BOTTLES DRAWN AEROBIC AND ANAEROBIC Blood Culture adequate volume Performed at [facility] [address], [place], [place] [address]    Culture   Final    NO GROWTH 5 DAYS Performed at [facility], [place] [address], [place], [place] [address]    Report Status 02/19/2019 FINAL  Final  C difficile quick scan w PCR reflex     Status: None   Collection Time: 02/18/19 11:43 PM  Result Value Ref Range Status   C Diff antigen NEGATIVE NEGATIVE Final   C Diff toxin NEGATIVE NEGATIVE Final   C Diff interpretation No C. difficile detected.  Final    Comment: Performed at [facility], [place] [address], [place], [place] [address]  Gastrointestinal Panel by PCR , Stool     Status: None   Collection  Time: 02/18/19 11:43 PM  Result Value Ref Range Status   Campylobacter species NOT DETECTED NOT DETECTED Final   Plesimonas shigelloides NOT DETECTED NOT DETECTED Final   Salmonella species NOT DETECTED NOT DETECTED Final   Yersinia enterocolitica NOT DETECTED NOT DETECTED Final   Vibrio species NOT DETECTED NOT DETECTED Final   Vibrio cholerae NOT DETECTED NOT DETECTED Final   Enteroaggregative E coli (EAEC) NOT DETECTED NOT DETECTED Final   Enteropathogenic E coli (EPEC) NOT DETECTED NOT DETECTED Final   Enterotoxigenic E coli (ETEC) NOT  DETECTED NOT DETECTED Final   Shiga like toxin producing E coli (STEC) NOT DETECTED NOT DETECTED Final   Shigella/Enteroinvasive E coli (EIEC) NOT DETECTED NOT DETECTED Final   Cryptosporidium NOT DETECTED NOT DETECTED Final   Cyclospora cayetanensis NOT DETECTED NOT DETECTED Final   Entamoeba histolytica NOT DETECTED NOT DETECTED Final   Giardia lamblia NOT DETECTED NOT DETECTED Final   Adenovirus F40/41 NOT DETECTED NOT DETECTED Final   Astrovirus NOT DETECTED NOT DETECTED Final   Norovirus GI/GII NOT DETECTED NOT DETECTED Final   Rotavirus A NOT DETECTED NOT DETECTED Final   Sapovirus (I, II, IV, and V) NOT DETECTED NOT DETECTED Final    Comment: Performed at [facility], [address] [place], [place] [address]         Radiology Studies: Ct Abdomen Pelvis W Contrast  Result Date: 02/19/2019 CLINICAL DATA:  Burkitt's lymphoma. Failure to thrive. Fluid collections along the colon. Post bowel resection EXAM: CT ABDOMEN AND PELVIS WITH CONTRAST TECHNIQUE: Multidetector CT imaging of the abdomen and pelvis was performed using the standard protocol following bolus administration of intravenous contrast. CONTRAST:  142m OMNIPAQUE IOHEXOL 300 MG/ML  SOLN COMPARISON:  CT 02/14/2019 FINDINGS: Lower chest: No pneumonia at the lung bases. Hepatobiliary: No focal hepatic lesion. No hepatic abscess. Gallbladder normal. Pancreas: Pancreas is  normal. No ductal dilatation. No pancreatic inflammation. Spleen: Normal spleen Adrenals/urinary tract: Adrenal glands normal. Small hypodense lesions in the kidney likely represent benign cysts no evidence of pyelonephritis. No hydronephrosis bladder normal Stomach/Bowel: Stomach, duodenum normal. Loop ileostomy in the mid RIGHT abdomen. Towards the terminal ileum the last 6 cm of small bowel demonstrate mucosal enhancement and fluid within lumen. The ascending colon demonstrates thickened nodular mucosa. There is no fluid collection surrounding the ascending colon. The transverse colon is mildly thick-walled with enhancement. The descending colon does demonstrate a pericolonic fluid collection beneath the serosal surface. There is adherence of the descending colon to the lateral abdominal wall in the mid descending colon (image 41/2). This is contiguous with the fluid collection surrounding the descending colon and proximal sigmoid colon which may communicate with the distal ileum at several locations (image 62/5 and image 89/5. Rectum normal. The fluid collection extending along the descending colon is similar to comparison exam. The fluid collection at the junction of the sigmoid colon and small bowel is decreased in volume. There is no clear evidence of extraluminal abscess. No evidence of perforation. Vascular/Lymphatic: Abdominal aorta is normal caliber. No periportal or retroperitoneal adenopathy. No pelvic adenopathy. Reproductive: Prostate normal Other: No free fluid. Musculoskeletal: No aggressive osseous lesion. IMPRESSION: 1. Persistent mucosal thickening and enhancement involving the entire colon consistent with pancolitis. 2. Fluid within the bowel wall of the descending colon appears to communicate with the distal ileum ileum at the level the sigmoid colon. The fluid collection is less prominent at the level of the sigmoid colon compared to exam 5 days prior. 3. Serosal surface of the descending  colon is adherent to the lateral abdominal wall peritoneal surface. 4. No intraperitoneal free air.  No extraluminal abscess. Electronically Signed   By: SSuzy BouchardM.D.   On: 02/19/2019 15:56        Scheduled Meds: . cholestyramine light  4 g Oral 3 times per day  . citalopram  10 mg Oral Daily  . dexamethasone  1 mg Oral Q breakfast  . insulin aspart  0-15 Units Subcutaneous 4 times per day  . oxandrolone  5 mg Oral BID  .  Short-Chain Fatty Acids Soln (60 mL)  60 mL Per Tube BID  . zinc sulfate  220 mg Oral BID   Continuous Infusions: . piperacillin-tazobactam (ZOSYN)  IV 3.375 g (02/21/19 0639)  . TPN CYCLIC-ADULT (ION)       LOS: 13 days    Time spent: 35 mins.More than 50% of that time was spent in counseling and/or coordination of care.      [name], MD [facility] Pager [phone]  If 7PM-7AM, please contact night-coverage [email or web address] Password [facility] 02/21/2019, 12:49 PM

## 2019-02-21 NOTE — Progress Notes (Signed)
Subjective: Condition stable.  Tolerating clear liquids.  Good ileostomy output.  Still has LLQ pain.  On TPN and Zosyn.  Objective: Vital signs in last 24 hours: Temp:  [97.7 F (36.5 C)-98.5 F (36.9 C)] 98.5 F (36.9 C) (06/10 0636) Pulse Rate:  [80-89] 89 (06/10 0636) Resp:  [12-16] 14 (06/10 0636) BP: (108-113)/(71-74) 108/71 (06/10 0636) SpO2:  [97 %] 97 % (06/10 0636) Last BM Date: 02/21/19  Intake/Output from previous day: 06/09 0701 - 06/10 0700 In: 429.9 [P.O.:360; I.V.:20; IV Piggyback:49.9] Out: 2250 [Urine:1725; Stool:525] Intake/Output this shift: Total I/O In: 141.7 [IV Piggyback:141.7] Out: -    Constitutional: No acute distress, conversant, appears states age. Eyes: Anicteric sclerae, moist conjunctiva, no lid lag Lungs: Clear to auscultation bilaterally, normal respiratory effort CV: regular rate and rhythm, no murmurs, no peripheral edema, pedal pulses 2+ GI: Soft, no masses or hepatosplenomegaly, tender to palpation LLQ, ostomy patent, no rebound/guarding Skin: No rashes, palpation reveals normal turgor Psychiatric: appropriate judgment and insight, oriented to person, place, and time   Lab Results:  Recent Labs    02/19/19 0444 02/21/19 0504  WBC 3.4* 3.7*  HGB 7.4* 8.2*  HCT 25.2* 26.8*  PLT 98* 110*   BMET Recent Labs    02/20/19 0425 02/21/19 0504  NA 132* 134*  K 4.0 4.2  CL 101 102  CO2 22 26  GLUCOSE 181* 187*  BUN 19 21  CREATININE 0.36* 0.35*  CALCIUM 8.1* 8.1*   PT/INR No results for input(s): LABPROT, INR in the last 72 hours. ABG No results for input(s): PHART, HCO3 in the last 72 hours.  Invalid input(s): PCO2, PO2  Studies/Results: Ct Abdomen Pelvis W Contrast  Result Date: 02/19/2019 CLINICAL DATA:  Burkitt's lymphoma. Failure to thrive. Fluid collections along the colon. Post bowel resection EXAM: CT ABDOMEN AND PELVIS WITH CONTRAST TECHNIQUE: Multidetector CT imaging of the abdomen and pelvis was performed  using the standard protocol following bolus administration of intravenous contrast. CONTRAST:  131m OMNIPAQUE IOHEXOL 300 MG/ML  SOLN COMPARISON:  CT 02/14/2019 FINDINGS: Lower chest: No pneumonia at the lung bases. Hepatobiliary: No focal hepatic lesion. No hepatic abscess. Gallbladder normal. Pancreas: Pancreas is normal. No ductal dilatation. No pancreatic inflammation. Spleen: Normal spleen Adrenals/urinary tract: Adrenal glands normal. Small hypodense lesions in the kidney likely represent benign cysts no evidence of pyelonephritis. No hydronephrosis bladder normal Stomach/Bowel: Stomach, duodenum normal. Loop ileostomy in the mid RIGHT abdomen. Towards the terminal ileum the last 6 cm of small bowel demonstrate mucosal enhancement and fluid within lumen. The ascending colon demonstrates thickened nodular mucosa. There is no fluid collection surrounding the ascending colon. The transverse colon is mildly thick-walled with enhancement. The descending colon does demonstrate a pericolonic fluid collection beneath the serosal surface. There is adherence of the descending colon to the lateral abdominal wall in the mid descending colon (image 41/2). This is contiguous with the fluid collection surrounding the descending colon and proximal sigmoid colon which may communicate with the distal ileum at several locations (image 62/5 and image 89/5. Rectum normal. The fluid collection extending along the descending colon is similar to comparison exam. The fluid collection at the junction of the sigmoid colon and small bowel is decreased in volume. There is no clear evidence of extraluminal abscess. No evidence of perforation. Vascular/Lymphatic: Abdominal aorta is normal caliber. No periportal or retroperitoneal adenopathy. No pelvic adenopathy. Reproductive: Prostate normal Other: No free fluid. Musculoskeletal: No aggressive osseous lesion. IMPRESSION: 1. Persistent mucosal thickening and enhancement involving the  entire colon consistent with pancolitis. 2. Fluid within the bowel wall of the descending colon appears to communicate with the distal ileum ileum at the level the sigmoid colon. The fluid collection is less prominent at the level of the sigmoid colon compared to exam 5 days prior. 3. Serosal surface of the descending colon is adherent to the lateral abdominal wall peritoneal surface. 4. No intraperitoneal free air.  No extraluminal abscess. Electronically Signed   By: Suzy Bouchard M.D.   On: 02/19/2019 15:56    Anti-infectives: Anti-infectives (From admission, onward)   Start     Dose/Rate Route Frequency Ordered Stop   02/14/19 1400  piperacillin-tazobactam (ZOSYN) IVPB 3.375 g     3.375 g 12.5 mL/hr over 240 Minutes Intravenous Every 8 hours 02/14/19 1342        Assessment/Plan:  Hx PE/DVT - on Eliquis Dehydration PCM - prealbumin 6.5>>7.7>>16- on TPN Palliative care - DNR Pancytopenia  FTT/ severe deconditioning  - dehydration/electrolyte abnormalities  Type II diabetes Depression   Descending and sigmoid colitis with fluid collections - IRdoesn't feel it is drainablebased on CT scan 6/3 -We have held his Eliquis and we are going to ask IR to attempt aspiration tomorrow -GI panel neg for infectious origin Hx of radiation colitis/proctitis 01/01/19 Burkitt's lymphoma with chemotherapy & radiation therapy - ongoing  FEN:Con'tNPO/TPN ID: Zosyn 6/3>> DVT: SCD's, Eliquis Follow up: TBD POC: Mark Frey,Mark Frey Spouse [phone]  [phone]   Plan: -He is stable from a surgical standpoint, however... -Pt with min overall improvement of his colitis.  This may not resolve with medical treatment.  D/w patient if he would consider surgery, total colectomy with likely permanent  ileostomy, and he states "it would not be my preference."   -Cont abx treatment  -We are holding Eliquis in hopes that IR can aspirate the fluid for culture tomorrow -Con't  TNA     LOS: 13 days    Mark Frey 02/21/2019

## 2019-02-21 NOTE — Progress Notes (Signed)
OT Cancellation Note  Patient Details Name: Mark Frey MRN: [id number] DOB: 13-Jun-1956   Cancelled Treatment:    Reason Eval/Treat Not Completed: Patient declined, no reason specified  [place] 02/21/2019, 3:11 PM  Lesle Chris, OTR/L Acute Rehabilitation Services [phone] [facility] pager [phone] office 02/21/2019

## 2019-02-21 NOTE — Telephone Encounter (Signed)
Patients wife called wanting to discuss the CT aspiration that was ordered by another provider (not affiliated with [facility]). This RN told the wife to call the nurse and discuss this procedure with the nurse and ordering provider to understand what the procedure is and why the procedure was ordered. Nothing further at the time of the phone call.

## 2019-02-21 NOTE — Progress Notes (Addendum)
Patient ID: Mark Frey, male   DOB: Feb 18, 1956, 63 y.o.   MRN: [id number]    Progress Note   Subjective  Day # 14  Tolerating clear liquids, no nausea, abdominal change in abd discomfort - just LLQ today   Continues TPN/cyclic  Continues Zosyn Has been refusing care  intermittently from nurses, and declined physical therapy  Ileostomy output 625 cc yesterday, increased with p.o. intake   Objective   Vital signs in last 24 hours: Temp:  [97.7 F (36.5 C)-98.5 F (36.9 C)] 98.5 F (36.9 C) (06/10 0636) Pulse Rate:  [80-89] 89 (06/10 0636) Resp:  [12-16] 14 (06/10 0636) BP: (108-113)/(71-74) 108/71 (06/10 0636) SpO2:  [97 %] 97 % (06/10 0636) Last BM Date: 02/20/19 General:  older  white male  in NAD- always pleasant with me Heart:  Regular rate and rhythm; no murmurs Lungs: Respirations even and unlabored, lungs CTA bilaterally Abdomen:  Soft, mildly tender LLQ and across lower abdomen,and nondistended. Normal bowel sounds. Ileostomy R abd Extremities:  Without edema. Neurologic:  Alert and oriented,  grossly normal neurologically. Psych:  Cooperative. Normal mood and affect.  Intake/Output from previous day: 06/09 0701 - 06/10 0700 In: 429.9 [P.O.:360; I.V.:20; IV Piggyback:49.9] Out: 2250 [Urine:1725; Stool:525] Intake/Output this shift: No intake/output data recorded.  Lab Results: Recent Labs    02/19/19 0444 02/21/19 0504  WBC 3.4* 3.7*  HGB 7.4* 8.2*  HCT 25.2* 26.8*  PLT 98* 110*   BMET Recent Labs    02/19/19 0444 02/20/19 0425 02/21/19 0504  NA 134* 132* 134*  K 4.2 4.0 4.2  CL 101 101 102  CO2 23 22 26   GLUCOSE 233* 181* 187*  BUN 24* 19 21  CREATININE 0.36* 0.36* 0.35*  CALCIUM 8.2* 8.1* 8.1*   LFT Recent Labs    02/19/19 0444  PROT 5.1*  ALBUMIN 1.9*  AST 12*  ALT 25  ALKPHOS 92  BILITOT <0.1*   PT/INR No results for input(s): LABPROT, INR in the last 72 hours.  Studies/Results: Ct Abdomen Pelvis W Contrast  Result Date:  02/19/2019 CLINICAL DATA:  Burkitt's lymphoma. Failure to thrive. Fluid collections along the colon. Post bowel resection EXAM: CT ABDOMEN AND PELVIS WITH CONTRAST TECHNIQUE: Multidetector CT imaging of the abdomen and pelvis was performed using the standard protocol following bolus administration of intravenous contrast. CONTRAST:  149m OMNIPAQUE IOHEXOL 300 MG/ML  SOLN COMPARISON:  CT 02/14/2019 FINDINGS: Lower chest: No pneumonia at the lung bases. Hepatobiliary: No focal hepatic lesion. No hepatic abscess. Gallbladder normal. Pancreas: Pancreas is normal. No ductal dilatation. No pancreatic inflammation. Spleen: Normal spleen Adrenals/urinary tract: Adrenal glands normal. Small hypodense lesions in the kidney likely represent benign cysts no evidence of pyelonephritis. No hydronephrosis bladder normal Stomach/Bowel: Stomach, duodenum normal. Loop ileostomy in the mid RIGHT abdomen. Towards the terminal ileum the last 6 cm of small bowel demonstrate mucosal enhancement and fluid within lumen. The ascending colon demonstrates thickened nodular mucosa. There is no fluid collection surrounding the ascending colon. The transverse colon is mildly thick-walled with enhancement. The descending colon does demonstrate a pericolonic fluid collection beneath the serosal surface. There is adherence of the descending colon to the lateral abdominal wall in the mid descending colon (image 41/2). This is contiguous with the fluid collection surrounding the descending colon and proximal sigmoid colon which may communicate with the distal ileum at several locations (image 62/5 and image 89/5. Rectum normal. The fluid collection extending along the descending colon is similar to comparison exam. The  fluid collection at the junction of the sigmoid colon and small bowel is decreased in volume. There is no clear evidence of extraluminal abscess. No evidence of perforation. Vascular/Lymphatic: Abdominal aorta is normal caliber. No  periportal or retroperitoneal adenopathy. No pelvic adenopathy. Reproductive: Prostate normal Other: No free fluid. Musculoskeletal: No aggressive osseous lesion. IMPRESSION: 1. Persistent mucosal thickening and enhancement involving the entire colon consistent with pancolitis. 2. Fluid within the bowel wall of the descending colon appears to communicate with the distal ileum ileum at the level the sigmoid colon. The fluid collection is less prominent at the level of the sigmoid colon compared to exam 5 days prior. 3. Serosal surface of the descending colon is adherent to the lateral abdominal wall peritoneal surface. 4. No intraperitoneal free air.  No extraluminal abscess. Electronically Signed   By: Suzy Bouchard M.D.   On: 02/19/2019 15:56       Assessment / Plan:     #1 63 yo WM with diagnosis of Burkitt's lymphoma dx about 1 year ago, patient has had a long complicated course. He developed recurrent bowel obstructions and severe sigmoid stricture for which he underwent loop ileostomy in December 2019. Treated with chemotherapy and radiation, both of which he has completed.  Radiation was completed in early April. 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 Treated initially with cholestyramine and short-chain fatty acid relation via ileostomy and enemas  Patient readmitted 02/08/2019 with failure to thrive, progressive weakness, poor oral intake and repeat imaging showed persistent diffuse colitis with new development of 2 associated fluid collections in the left colon the largest measuring 8 cm x 2 cm, concerning for abscess  He has been on IV antibiotics, currently continues on Zosyn Infectious work-up has been negative, CMV DNA still pending  Has been on bowel rest, patient is now hungry, started clear liquids yesterday which she is tolerating.  He has not  been having high-volume ileostomy output.  No "diarrhea" per rectum, has mucoid discharge 3-4 times per day  Increased complaints of left lower quadrant pain, and repeat imaging 6 8 persistent diffuse pancolitis, there was concern for communication of the fluid within the bowel wall of the descending colon the distal ileum.  There is not felt to be any drainable abscess  Surgery plans to have fluid collection aspirated tomorrow, Eliquis has been on hold. Goal is to decide regarding narrowing antibiotics and determining duration.  Discussed colonoscopy and ileoscopy with the patient this morning.  He is agreeable, to any therapy that may help him prevent surgery.  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

## 2019-02-21 NOTE — Telephone Encounter (Signed)
Pt's wife stated that pt has been admitted at [facility].  She reported that he is to be scheduled for a procedure tomorrow.  Pt's wife requested a call back to discuss procedure.

## 2019-02-21 NOTE — Progress Notes (Signed)
Patient would not allow [place] to reposition him or clean him up during the shift.

## 2019-02-22 ENCOUNTER — Inpatient Hospital Stay (HOSPITAL_COMMUNITY): Payer: BC Managed Care – PPO

## 2019-02-22 DIAGNOSIS — Z932 Ileostomy status: Secondary | ICD-10-CM

## 2019-02-22 DIAGNOSIS — Z79899 Other long term (current) drug therapy: Secondary | ICD-10-CM

## 2019-02-22 DIAGNOSIS — Z9221 Personal history of antineoplastic chemotherapy: Secondary | ICD-10-CM

## 2019-02-22 DIAGNOSIS — K5100 Ulcerative (chronic) pancolitis without complications: Secondary | ICD-10-CM

## 2019-02-22 DIAGNOSIS — K651 Peritoneal abscess: Secondary | ICD-10-CM

## 2019-02-22 DIAGNOSIS — K630 Abscess of intestine: Secondary | ICD-10-CM

## 2019-02-22 LAB — GLUCOSE, CAPILLARY
Glucose-Capillary: 114 mg/dL — ABNORMAL HIGH (ref 70–99)
Glucose-Capillary: 119 mg/dL — ABNORMAL HIGH (ref 70–99)
Glucose-Capillary: 172 mg/dL — ABNORMAL HIGH (ref 70–99)
Glucose-Capillary: 200 mg/dL — ABNORMAL HIGH (ref 70–99)
Glucose-Capillary: 209 mg/dL — ABNORMAL HIGH (ref 70–99)
Glucose-Capillary: 210 mg/dL — ABNORMAL HIGH (ref 70–99)

## 2019-02-22 LAB — COMPREHENSIVE METABOLIC PANEL
ALT: 36 U/L (ref 0–44)
AST: 19 U/L (ref 15–41)
Albumin: 2 g/dL — ABNORMAL LOW (ref 3.5–5.0)
Alkaline Phosphatase: 82 U/L (ref 38–126)
Anion gap: 9 (ref 5–15)
BUN: 20 mg/dL (ref 8–23)
CO2: 25 mmol/L (ref 22–32)
Calcium: 8.1 mg/dL — ABNORMAL LOW (ref 8.9–10.3)
Chloride: 102 mmol/L (ref 98–111)
Creatinine, Ser: 0.36 mg/dL — ABNORMAL LOW (ref 0.61–1.24)
GFR calc Af Amer: >60 mL/min (ref >60)
GFR calc non Af Amer: >60 mL/min (ref >60)
Glucose, Bld: 169 mg/dL — ABNORMAL HIGH (ref 70–99)
Potassium: 4 mmol/L (ref 3.5–5.1)
Sodium: 136 mmol/L (ref 135–145)
Total Bilirubin: 0.1 mg/dL — ABNORMAL LOW (ref 0.3–1.2)
Total Protein: 5.1 g/dL — ABNORMAL LOW (ref 6.5–8.1)

## 2019-02-22 LAB — CBC WITH DIFFERENTIAL/PLATELET
Abs Immature Granulocytes: 0.17 10*3/uL — ABNORMAL HIGH (ref 0.00–0.07)
Basophils Absolute: 0 10*3/uL (ref 0.0–0.1)
Basophils Relative: 1 %
Eosinophils Absolute: 0 10*3/uL (ref 0.0–0.5)
Eosinophils Relative: 1 %
HCT: 26.8 % — ABNORMAL LOW (ref 39.0–52.0)
Hemoglobin: 8.2 g/dL — ABNORMAL LOW (ref 13.0–17.0)
Immature Granulocytes: 5 %
Lymphocytes Relative: 9 %
Lymphs Abs: 0.3 10*3/uL — ABNORMAL LOW (ref 0.7–4.0)
MCH: 30.6 pg (ref 26.0–34.0)
MCHC: 30.6 g/dL (ref 30.0–36.0)
MCV: 100 fL (ref 80.0–100.0)
Monocytes Absolute: 0.4 10*3/uL (ref 0.1–1.0)
Monocytes Relative: 12 %
Neutro Abs: 2.6 10*3/uL (ref 1.7–7.7)
Neutrophils Relative %: 72 %
Platelets: 109 10*3/uL — ABNORMAL LOW (ref 150–400)
RBC: 2.68 MIL/uL — ABNORMAL LOW (ref 4.22–5.81)
RDW: 17.8 % — ABNORMAL HIGH (ref 11.5–15.5)
WBC: 3.5 10*3/uL — ABNORMAL LOW (ref 4.0–10.5)
nRBC: 0 % (ref 0.0–0.2)

## 2019-02-22 LAB — MAGNESIUM: Magnesium: 1.9 mg/dL (ref 1.7–2.4)

## 2019-02-22 LAB — PROTIME-INR
INR: 1 (ref 0.8–1.2)
Prothrombin Time: 12.9 seconds (ref 11.4–15.2)

## 2019-02-22 LAB — PHOSPHORUS: Phosphorus: 2.4 mg/dL — ABNORMAL LOW (ref 2.5–4.6)

## 2019-02-22 MED ORDER — BOOST / RESOURCE BREEZE PO LIQD CUSTOM
1.0000 | ORAL | Status: DC
  Administered 2019-02-27: 1 via ORAL

## 2019-02-22 MED ORDER — SODIUM CHLORIDE 0.9 % IV SOLN
INTRAVENOUS | Status: DC | PRN
  Administered 2019-02-22: 250 mL via INTRAVENOUS
  Administered 2019-02-24: 10 mL via INTRAVENOUS

## 2019-02-22 MED ORDER — FENTANYL CITRATE (PF) 100 MCG/2ML IJ SOLN
INTRAMUSCULAR | Status: AC
  Filled 2019-02-22: qty 4

## 2019-02-22 MED ORDER — MIDAZOLAM HCL 2 MG/2ML IJ SOLN
INTRAMUSCULAR | Status: AC | PRN
  Administered 2019-02-22 (×3): 1 mg via INTRAVENOUS

## 2019-02-22 MED ORDER — TRAVASOL 10 % IV SOLN
INTRAVENOUS | Status: AC
  Administered 2019-02-22: 19:00:00 via INTRAVENOUS
  Filled 2019-02-22: qty 1254

## 2019-02-22 MED ORDER — LIDOCAINE HCL (PF) 1 % IJ SOLN
INTRAMUSCULAR | Status: AC | PRN
  Administered 2019-02-22: 10 mL

## 2019-02-22 MED ORDER — JUVEN PO PACK
1.0000 | PACK | Freq: Two times a day (BID) | ORAL | Status: DC
  Filled 2019-02-22 (×12): qty 1

## 2019-02-22 MED ORDER — MIDAZOLAM HCL 2 MG/2ML IJ SOLN
INTRAMUSCULAR | Status: AC
  Filled 2019-02-22: qty 4

## 2019-02-22 MED ORDER — FENTANYL CITRATE (PF) 100 MCG/2ML IJ SOLN
INTRAMUSCULAR | Status: AC | PRN
  Administered 2019-02-22 (×3): 50 ug via INTRAVENOUS

## 2019-02-22 MED ORDER — PRO-STAT SUGAR FREE PO LIQD: 30.0000 mL | Freq: Every day | ORAL | Status: DC

## 2019-02-22 NOTE — Procedures (Signed)
Interventional Radiology Procedure Note  Procedure: CT Guided aspiration of LLQ fluid  Complications: None  Estimated Blood Loss: < 10 mL  Findings: 18 G trocar needle advanced to pericolonic fluid anterior to proximal sigmoid colon in LLQ with return of 4 mL of bloody, turbid fluid. Fluid sample sent for culture analysis (aerobic/anaerobic and fungal).   Venetia Night. Kathlene Cote, M.D Pager:  [phone]

## 2019-02-22 NOTE — Progress Notes (Signed)
Patient ID: Mark Frey, male   DOB: 10-06-1955, 63 y.o.   MRN: [id number]  [facility] Surgery Progress Note     Subjective: CC-  Persistent LLQ pain, no better or worse. Denies n/v. Ostomy functioning.  VSS IR today to attempt CT-guided aspiration of left pericolonic fluid for culture.  Objective: Vital signs in last 24 hours: Temp:  [97.8 F (36.6 C)-98.1 F (36.7 C)] 97.8 F (36.6 C) (06/11 0408) Pulse Rate:  [79-92] 92 (06/11 0408) Resp:  [12-18] 12 (06/11 0408) BP: (97-116)/(57-72) 116/72 (06/11 0408) SpO2:  [97 %-98 %] 97 % (06/11 0408) Last BM Date: 02/21/19  Intake/Output from previous day: 06/10 0701 - 06/11 0700 In: 2850.1 [P.O.:480; I.V.:2065.4; IV Piggyback:304.8] Out: 550 [Urine:550] Intake/Output this shift: Total I/O In: -  Out: 500 [Urine:500]  PE: Gen: Alert, NAD, cooperative HEENT: EOM's intact, pupils equal and round Pulm: effort normal Abd: Soft,nondistended, mild LLQ TTP without peritonitis,ostomy viable with air and green/bright red blood-tinged liquid stool in bag Skin: warm and dry    Lab Results:  Recent Labs    02/21/19 0504 02/22/19 0440  WBC 3.7* 3.5*  HGB 8.2* 8.2*  HCT 26.8* 26.8*  PLT 110* 109*   BMET Recent Labs    02/21/19 0504 02/22/19 0440  NA 134* 136  K 4.2 4.0  CL 102 102  CO2 26 25  GLUCOSE 187* 169*  BUN 21 20  CREATININE 0.35* 0.36*  CALCIUM 8.1* 8.1*   PT/INR Recent Labs    02/22/19 0440  LABPROT 12.9  INR 1.0   CMP     Component Value Date/Time   NA 136 02/22/2019 0440   K 4.0 02/22/2019 0440   CL 102 02/22/2019 0440   CO2 25 02/22/2019 0440   GLUCOSE 169 (H) 02/22/2019 0440   BUN 20 02/22/2019 0440   CREATININE 0.36 (L) 02/22/2019 0440   CREATININE 0.78 01/25/2019 1050   CREATININE 1.26 12/01/2012 1700   CALCIUM 8.1 (L) 02/22/2019 0440   PROT 5.1 (L) 02/22/2019 0440   ALBUMIN 2.0 (L) 02/22/2019 0440   AST 19 02/22/2019 0440   AST 18 01/25/2019 1050   ALT 36 02/22/2019 0440    ALT 59 (H) 01/25/2019 1050   ALKPHOS 82 02/22/2019 0440   BILITOT 0.1 (L) 02/22/2019 0440   BILITOT 0.6 01/25/2019 1050   GFRNONAA >60 02/22/2019 0440   GFRNONAA >60 01/25/2019 1050   GFRAA >60 02/22/2019 0440   GFRAA >60 01/25/2019 1050   Lipase     Component Value Date/Time   LIPASE 19 08/17/2018 1738       Studies/Results: No results found.  Anti-infectives: Anti-infectives (From admission, onward)   Start     Dose/Rate Route Frequency Ordered Stop   02/14/19 1400  piperacillin-tazobactam (ZOSYN) IVPB 3.375 g     3.375 g 12.5 mL/hr over 240 Minutes Intravenous Every 8 hours 02/14/19 1342         Assessment/Plan Hx PE/DVT - on Eliquis (last dose 6/9) Dehydration PCM - prealbumin 6.5>>7.7>>16- on TPN Palliative care - DNR Pancytopenia  FTT/ severe deconditioning  - dehydration/electrolyte abnormalities  Type II diabetes Depression   Descending and sigmoid colitis with fluid collections - IRdoesn't feel it is drainablebased on CT scan 6/3 - GI panel and c diff neg for infectious origin - Dr. 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    LOS: 14 days    Wellington Hampshire , [facility] Surgery 02/22/2019, 8:01 AM Pager:  [phone] Mon-Thurs 7:00 am-4:30 pm Fri 7:00 am -11:30 AM Sat-Sun 7:00 am-11:30 am

## 2019-02-22 NOTE — Progress Notes (Signed)
Patient ID: KYLIN DUBS, male   DOB: 1956/03/19, 63 y.o.   MRN: [id number]    Progress Note   Subjective  Day #74 Cyclic TPN On Zosyn Pt feels ok, c/o mild LLQ pain- just back from Ct aspiration -fluid analysis sent  Ileostomy outpt- not recorded   Objective   Vital signs in last 24 hours: Temp:  [97.8 F (36.6 C)-98.1 F (36.7 C)] 97.8 F (36.6 C) (06/11 0408) Pulse Rate:  [79-92] 92 (06/11 0408) Resp:  [12-18] 12 (06/11 0408) BP: (97-116)/(57-72) 116/72 (06/11 0408) SpO2:  [97 %-98 %] 97 % (06/11 0408) Last BM Date: 02/22/19 General:    white male in NAD Heart:  Regular rate and rhythm; no murmurs Lungs: Respirations even and unlabored, lungs CTA bilaterally Abdomen:  Soft, mildly tender and nondistended. Normal bowel sounds.Ileostomy with streaky red blood today blood  Extremities:  Without edema. Neurologic:  Alert and oriented,  grossly normal neurologically. Psych:  Cooperative. Normal mood and affect.  Intake/Output from previous day: 06/10 0701 - 06/11 0700 In: 2850.1 [P.O.:480; I.V.:2065.4; IV Piggyback:304.8] Out: 550 [Urine:550] Intake/Output this shift: Total I/O In: -  Out: 800 [Urine:650; Stool:150]  Lab Results: Recent Labs    02/21/19 0504 02/22/19 0440  WBC 3.7* 3.5*  HGB 8.2* 8.2*  HCT 26.8* 26.8*  PLT 110* 109*   BMET Recent Labs    02/20/19 0425 02/21/19 0504 02/22/19 0440  NA 132* 134* 136  K 4.0 4.2 4.0  CL 101 102 102  CO2 22 26 25   GLUCOSE 181* 187* 169*  BUN 19 21 20   CREATININE 0.36* 0.35* 0.36*  CALCIUM 8.1* 8.1* 8.1*   LFT Recent Labs    02/22/19 0440  PROT 5.1*  ALBUMIN 2.0*  AST 19  ALT 36  ALKPHOS 82  BILITOT 0.1*   PT/INR Recent Labs    02/22/19 0440  LABPROT 12.9  INR 1.0      Assessment / Plan:    #1 63 yo WM with Burkitts Lymphoma dx 2595, with long complicated course.  Patient developed recurrent bowel obstructions and a severe sigmoid stricture and underwent loop ileostomy December 2019.  He has completed chemotherapy and completed radiation in early April 2020. Patient developed acute diffuse colitis immediately after completing radiation,, and therefore colitis felt very likely to be radiation induced though cannot rule out diversion colitis.  He has had a persistent diffuse colitis and on repeat imaging this admission had developed 2 new associated fluid collections in the left colon the largest measuring 8 x 2 cm.   On initial imaging and follow-up not felt to be amenable to drainage. He underwent IR aspiration this morning-fluid studies have been sent  He is currently on Questran and short-chain fatty acids via ileostomy started 02/21/2019.  #2 pancytopenia stable-we will recheck counts in a.m. prior to procedure  Plan ; from GI standpoint is to proceed with colonoscopy and ileoscopy tomorrow a.m.  This is been discussed in detail with the patient including indications risks and benefits and he is agreeable to proceed.  We will communicate with his wife today as well.  He is at some increased risk for complications with procedure with expected transmural inflammation.  Will leave on clear liquids today, n.p.o. after midnight, and no bowel prep           Principal Problem:   Pancolitis ([place]) Active Problems:   Controlled diabetes mellitus type II without complication (HCC)   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HCC)   Burkitt lymphoma  of lymph nodes of multiple regions ([place])   Deep vein thrombosis (DVT) of femoral vein of right lower extremity (HCC)   Diabetes mellitus (HCC)   Bowel obstruction in setting of lymphoma and sigmoid stricture   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HCC)   Hyponatremia   Malnutrition of moderate degree   Dehydration   Palliative care by specialist   Goals of care, counseling/discussion   Symptomatic anemia   Atrophy of muscle of multiple sites   Depression   Abnormal CT scan, gastrointestinal tract   Thrombocytopenia ([facility]CC)   Abscess of sigmoid  colon   Loop ilesotomy for fecal diversion of sigmoid stricture   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)     LOS: 14 days   Amy Esterwood  02/22/2019, 8:52 AM

## 2019-02-22 NOTE — Plan of Care (Signed)
Pt tolerating small amounts of clear liquids and full liquids. Pain managed with PO medications. Continue to encourage patient to turn/ambulate. Continued education on skin care related to breakdown from moisture and immobility.

## 2019-02-22 NOTE — Progress Notes (Signed)
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 Usual Weight: 190 lb - 15% weight loss in last month per RD  Current Nutrition: NPO, TPN  IVF: none - D5W d/c'd 6/8 AM  Central access: Port TPN start date: 5/29  ASSESSMENT                                                                                                          HPI: 63 yo male with burkitt's lymphoma s/p chemo now with severe radiation colitis, prolonged malnutrition, multiple recent admissions to start TPN per pharmacy management  Significant events:  6/3 repeat CT abd/pelvis> Descending and sigmoid colitis with fluid collections, not drainable by IR, Zosyn added, made NPO 6/9: apixaban d/c'd for plan for endoscopic eval in next few days. And possibly colonic resection in future per GI notes 6/11: CT guided aspiration of LLQ fluid per IR this AM  Today, 02/22/19  Glucose - goal < 657 while on cyclic TPN (tolerate higher goal CBGs while on cyclic) - 10 Units XUX/83 hours;   CBGs 221, 210, 209, 172 (unsure why CBGs were checked so often last night since it is not ordered that way) during 12 hr cylcle of TPN w/ 40 units of insulin.  CBGs while TPN off 93, 113, 114, 114 1 hr after TPN turned off this AM. Note hx DM and chronic dexamethasone 2 mg qday > decreased to 1 mg/day 6/2.  Electrolytes - WNL, except phos slightly low at 2.4  Renal - WNL  LFTs - AST/ALT stable/WNL  TGs - 71 (6/1), 98 (6/8)  Prealbumin - 7.7 (6/1), 16 (6/8) oxandrin added 5/29, dose increased 6/1  NUTRITIONAL GOALS                                                                                             RD recs:  2300-2500 kcal/day, 115-125g protein/day  Custom TPN at goal of 95 ml/hr over 33OVA or 12 hr cyclic administration getting 2280 ml to provide: 125 g/day protein, 2270Kcal/day.  PLAN  At 1800 today:  Continue custom cyclic TPN with 0388 mls/day over 12 hours = full support  TPN to contain standard multivitamins daily and trace elements only on MWF due to national shortage.  Standard TPN concentrations of lytes: Na at 125 mEq/L, K at 45, Mag 8, EK:CMKL 1:1 and increase phos to 20  IVF: none  Continue modSSI/CBGs - custom schedule for cyclic TPN  Continue current insulin amount of 40 units in TPN bag  TPN lab panels on Mondays & Thursdays.     Adrian Saran, PharmD, BCPS 02/22/2019 9:43 AM

## 2019-02-22 NOTE — Progress Notes (Signed)
Nutrition Follow-up  RD working remotely.   DOCUMENTATION CODES:   Non-severe (moderate) malnutrition in context of chronic illness  INTERVENTION:  - will order Boost Breeze once/day, each supplement provides 250 kcal and 9 grams of protein. - will order 1 packet Juven BID, each packet provides 95 calories, 2.5 grams of protein (collagen), and 9.8 grams of carbohydrate (3 grams sugar); also contains 7 grams of L-arginine and L-glutamine, 300 mg vitamin C, 15 mg vitamin E, 1.2 mcg vitamin B-12, 9.5 mg zinc, 200 mg calcium, and 1.5 g  Calcium Beta-hydroxy-Beta-methylbutyrate to support wound healing. - will order 30 mL Prostat once/day, each supplement provides 100 kcal and 15 grams of protein. - continue TPN per Pharmacy. - continue to advance diet as medically feasible and encourage intakes.   NUTRITION DIAGNOSIS:   Increased nutrient needs related to chronic illness, cancer and cancer related treatments as evidenced by estimated needs. -onging  GOAL:   Patient will meet greater than or equal to 90% of their needs -met with TPN regimen  MONITOR:   PO intake, Supplement acceptance, Diet advancement, Labs, Weight trends, Skin, Other (Comment)(TPN regimen)  ASSESSMENT:   64 y.o. male with medical history significant of Burkitt's lymphoma s/p radiation and chemotherapy, s/p ileostomy, Type 2 DM, radiation colitis, and PE/DVT on Eliquis. He presented to the ED with 3 week history of worsening weakness. At baseline, he is able to ambulate independently with a walker but more recently was unable to stand up on his own. The patient and his wife went to his PCP on 5/26 to discuss decline and to assess capacity. Patient, at that time was considering hospice referral, but has now opted for acute medical treatment.   Patient has not been weighed since admission (5/28). Diet advanced from NPO to CLD today at 10:10 AM and plan to revert back to NPO at midnight tonight. RN note indicates patient  refusing short-chain fatty acids via ileostomy today.   Double lumen PICC was placed in L brachial on 6/9 (previously receiving TPN via implanted port). Patient is receiving cyclic TPN over 12 hours which is providing 2270 kcal, 125 grams protein to meet 100% estimated nutrition needs.  Per notes: plan for colonoscopy and ileoscopy on 6/12, stage 2 B-cell lymphoma currently in remission, severe pancolitis thought to be 2/2 radiation toxicity. Plan is for SNF when medically stable; plan for TPN at SNF.     Medications reviewed; sliding scale novolog, 60 ml short-chain fatty acids BID, 220 mg zinc sulfate BID.  Labs reviewed; CBGs: 210, 209, 172, 114, and 119 mg/dl today, creatinine: 0.36 mg/dl, Ca: 8.1 mg/dl, Phos: 2.4 mg/dl. Fe and triglycerides WDL as of 6/8.     Diet Order:   Diet Order            Diet NPO time specified  Diet effective midnight        Diet clear liquid Room service appropriate? Yes; Fluid consistency: Thin  Diet effective now              EDUCATION NEEDS:   Not appropriate for education at this time  Skin:  Skin Assessment: Skin Integrity Issues: Skin Integrity Issues:: Stage II Stage II: sacrum (new: 5/30)  Last BM:  6/11  Height:   Ht Readings from Last 1 Encounters:  02/08/19 5' 9"  (1.753 m)    Weight:   Wt Readings from Last 1 Encounters:  02/08/19 69 kg    Ideal Body Weight:  72.7 kg  BMI:  Body  mass index is 22.46 kg/m.  Estimated Nutritional Needs:   Kcal:  2300-2500 kcal  Protein:  115-125 grams  Fluid:  >/= 2.2 L/day     Jarome Matin, MS, RD, LDN, [facility] Inpatient Clinical Dietitian Pager # [phone] After hours/weekend pager # [phone]

## 2019-02-22 NOTE — Progress Notes (Signed)
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  On his baseline, is able to ambulate independently with a walker but more recently he was unable to stand on his own. Patient has been started on TPN for failure to thrive.  Oncology was following.  Patient was complaining of persistent abdominal discomfort.  CT abdomen/pelvis done here showed Multifocal fluid collections along the course of the descending and sigmoid colon  consistent with developing abscesses.Started on antibiotics.General surgery/IR following.  CT abdomen and pelvis was performed on 02/19/2019 that revealed persistent mucosal thickening and enhancement involving the entire colon consistent with pancolitis. There appeared to be a communicating fluid filled tract between the distal ileum to the sigmoid colon. General surgery offered operative solution via total colectomy with ileostomy. The patient declined.   IR was consulted and the patient underwent percutaneous drainage of abscess collection on 02/22/2019. 4 cc of fluid was obtained and was sent for culture.   The patient is receiving TPN as managed by pharmacy.  Consultants  . General Surgery . Gastroenterology . Palliative Care  Procedures  . Percutaneous drainage of pericolonic abscess collection by IR on 02/22/2019  Antibiotics  . Zosyn  Subjective  The patient is seen post procedure. He is somnolent, but arousable. He denies any new complaints.  Objective   Vitals:  Vitals:   02/22/19 0920 02/22/19 1342  BP: 106/76 113/70  Pulse: 90 86  Resp: 11 15  Temp:  97.9 F (36.6 C)  SpO2: 100% 97%    Exam:  Constitutional:  . The patient is somnolent, but  arouseable. No acute distress.  Respiratory:  . No increased work of breathing.  . No wheezes, rales, or rhonchi.  . No tactile fremitus. Cardiovascular:  . Regular rate and rhythm.  . No murmurs, ectopy, or gallups are appreciated. . No lateral PMI. No thrills. . No LE extremity edema   Abdomen:  . Abdomen is soft, non-tender, non-distended . No hernias, masses or organomegaly are appreciated. [name] Hypoactive bowel sounds. Musculoskeletal:  . No cyanosis, clubbing, or edema Skin:  . No rashes, lesions, ulcers . palpation of skin: no induration or nodules Neurologic:  . Pt is unable to cooperate with exam. Psychiatric:  . Mental status - Pt is somnolent and unable to cooperate with evaluation of psychiatric condition at this point.  I have personally reviewed the following:   Today's Data   CBC    Component Value Date/Time   WBC 3.5 (L) 02/22/2019 0440   RBC 2.68 (L) 02/22/2019 0440   HGB 8.2 (L) 02/22/2019 0440   HGB 8.7 (L) 01/25/2019 1050   HCT 26.8 (L) 02/22/2019 0440   HCT 24.0 (L) 02/08/2019 1635   PLT 109 (L) 02/22/2019 0440   PLT 139 (L) 01/25/2019 1050   MCV 100.0 02/22/2019 0440   MCH 30.6 02/22/2019 0440   MCHC 30.6 02/22/2019 0440   RDW 17.8 (H) 02/22/2019 0440   LYMPHSABS 0.3 (L) 02/22/2019 0440   MONOABS 0.4 02/22/2019 0440   EOSABS 0.0 02/22/2019 0440   BASOSABS 0.0 02/22/2019 0440   BMP Latest Ref Rng & Units 02/22/2019 02/21/2019 02/20/2019  Glucose 70 - 99 mg/dL 169(H) 187(H) 181(H)  BUN 8 - 23 mg/dL 20 21 19   Creatinine  0.61 - 1.24 mg/dL 0.36(L) 0.35(L) 0.36(L)  Sodium 135 - 145 mmol/L 136 134(L) 132(L)  Potassium 3.5 - 5.1 mmol/L 4.0 4.2 4.0  Chloride 98 - 111 mmol/L 102 102 101  CO2 22 - 32 mmol/L 25 26 22   Calcium 8.9 - 10.3 mg/dL 8.1(L) 8.1(L) 8.1(L)  .   Micro Data  . Body fluid culture pending.  Imaging  . CT abdomen/pelvis  Scheduled Meds: . cholestyramine light  4 g Oral 3 times per day  . citalopram  10 mg Oral Daily  .  dexamethasone  1 mg Oral Q breakfast  . feeding supplement  1 Container Oral Q24H  . feeding supplement (PRO-STAT SUGAR FREE 64)  30 mL Oral Daily  . insulin aspart  0-15 Units Subcutaneous 4 times per day  . nutrition supplement (JUVEN)  1 packet Oral BID BM  . oxandrolone  5 mg Oral BID  . Short-Chain Fatty Acids Soln (60 mL)  60 mL Per Tube BID  . zinc sulfate  220 mg Oral BID   Continuous Infusions: . piperacillin-tazobactam (ZOSYN)  IV 3.375 g (02/22/19 1317)  . 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)   Deep vein thrombosis (DVT) of femoral vein of right lower extremity (HCC)   Diabetes mellitus (HCC)   Bowel obstruction in setting of lymphoma and sigmoid stricture   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HCC)   Hyponatremia   Malnutrition of moderate degree   Dehydration   Palliative care by specialist   Goals of care, counseling/discussion   Symptomatic anemia   Atrophy of muscle of multiple sites   Depression   Abnormal CT scan, gastrointestinal tract   Thrombocytopenia (HCC)   Abscess of sigmoid colon   Loop ilesotomy for fecal diversion of sigmoid stricture   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)   LOS: 14 days    A & P   Abdominal abscesses: Patient was complaining of persistent abdominal discomfort.  CT abdomen/pelvis done showed Multifocal fluid collections along the course of the descending and sigmoid colon consistent with developing abscesses.General surgery/IR consulted.Started on zosyn. Negative  blood cultures.Patient is not septic. On full liquid diet. CT abdomen/pelvis as above.  Patient denied colectomy by surgery.  Pt underwent percutaneous aspiration of pericolonic abscess today. Fluid was sent for culture. 4cc obtained.  High-grade  B-cell lymphoma: History of B-cell lymphoma/Burkitt's lymphoma, stage II E. Oncology following.  Currently in remission  History of a small bowel obstruction/ileus: Status post diverting ileostomy on 08/25/2018. He is having stool output from the ileostomy bag.  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  GI panel, C. difficile negative.  Elevated ESR and CRP.GI following.CMV DNA negative .Negative cdiff /pending GI pathogen panel. Continue cholestyramine.  GI planning for  short chain fatty acid enema but patient is reluctant .  Pancytopenia: Multifactorial. Hb this morning is 8.2.  He has been transfused with 3 units of  PRBCs so far.  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  Also has thrombocytopenia.  Failure to thrive/dehydration/electrolyte abnormalities: Due to low oral intake, high ostomy output.  Started on TPN on 02/09/2019. Expected bowel dysfunction for weeks to months.  Continue TPN.  Electrolytes being monitored and supplemented.  Diabetes type 2: Continue sliding scale insulin.  Monitor CBGs.  History of right lower extremity DVT/bilateral PE: On eliquis.Now held for colonic fluid aspiration  Depression: Continue Celexa  Debility/deconditioning: Plan is to discharge to skilled nursing facility with TPN when medically stable.He has a PICC line line on left UE  He has been accepted at [facility] skilled nursing facility.    Nutrition Problem: Increased nutrient needs Etiology: chronic illness, cancer and cancer related treatments  I have seen and examined this patient myself. I have spent 35 minutes in his evaluation and care.  DVT prophylaxis: Eliquis Code Status: DNR Family Communication: Discussed with wife on phone on 02/21/2019 .  Patient was too somnolent for me to obtain consent to speak with his wife from him.  Disposition Plan: Skilled nursing facility after medical stabilization  Jovann Luse, DO [facility] Direct contact: see [email or web address]  7PM-7AM contact night coverage as above 02/22/2019, 2:03 PM  LOS: 14 days

## 2019-02-22 NOTE — Progress Notes (Addendum)
Pt refusing questran and short chain fatty acids via ileostomy. Educated on the importance. Verbalized understanding.

## 2019-02-22 NOTE — Progress Notes (Addendum)
HEMATOLOGY-ONCOLOGY PROGRESS NOTE  SUBJECTIVE: Still having LLQ abdominal pain and liquid stool through ostomy. Had CT guided aspiration of the LLQ peritoneal fluid this am. Fluid sent for cultures. He is scheduled for a colonoscopy and ileoscopy tomorrow.    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)  03/13/2018 Initial Biopsy   Peritoneal mass biopsy showed high-grade B-cell lymphoma.  Based on the IHC studies, the differential diagnosis includes diffuse large B-cell lymphoma, Burkitt lymphoma as well as but can like lymphoma.    03/15/2018 Initial Diagnosis   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[place])   03/15/2018 Procedure   Colonoscopy showed a large tumor in the proximal ascending colon, likely from the direct invasion of his abdominal lymphoma.  Biopsy confirmed high-grade B-cell lymphoma.   03/17/2018 -  Chemotherapy   R-EPOCH every 3 weeks   03/17/2018 Procedure   Bone marrow biopsy was negative for lymphoma involvement.   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 He received intrathecal methotrexate.   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[place])  04/10/2018 Initial Diagnosis   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[place])   04/16/2018 -  Chemotherapy   The patient had riTUXimab (RITUXAN) 900 mg in sodium chloride 0.9 % 250 mL (2.6471 mg/mL) infusion, 375 mg/m2 = 900 mg, Intravenous,  Once, 1 of 1 cycle riTUXimab (RITUXAN) 900 mg in sodium chloride 0.9 % 160 mL infusion, 375 mg/m2 = 900 mg,  Intravenous,  Once, 5 of 6 cycles Administration: 900 mg (04/17/2018), 900 mg (05/08/2018), 900 mg (05/29/2018), 900 mg (07/10/2018), 900 mg (06/19/2018)  for chemotherapy treatment.       REVIEW OF SYSTEMS:   A comprehensive 14 point review of systems was negative except as noted in the HPI.  I have reviewed the past medical history, past surgical history, social history and family history with the patient and they are unchanged from previous note.   PHYSICAL EXAMINATION:  Vitals:   02/22/19 0915 02/22/19 0920  BP: 111/77 106/76  Pulse: 90 90  Resp: 14 11  Temp:    SpO2: 99% 100%   Filed Weights   02/08/19 1726  Weight: 152 lb 1.9 oz (69 kg)    Intake/Output from previous day: 06/10 0701 - 06/11 0700 In: 2850.1 [P.O.:480; I.V.:2065.4; IV Piggyback:304.8] Out: 550 [Urine:550]  GENERAL:alert, no distress and comfortable SKIN: No rashes or significant lesions. EYES: normal, Conjunctiva are pink and non-injected, sclera clear OROPHARYNX:no exudate, no erythema and lips, buccal mucosa, and tongue normal  NECK: supple, thyroid normal size, non-tender, without nodularity LYMPH:  no palpable lymphadenopathy in the cervical, axillary or inguinal LUNGS: clear to auscultation and percussion with normal breathing effort HEART: regular rate & rhythm and no murmurs and no lower extremity edema. ABDOMEN: Soft, mild tenderness to the LLQ with light palpation.  Liquid stool noted in the ostomy.  Positive bowel sounds. Musculoskeletal:no cyanosis of digits and no clubbing  NEURO: alert & oriented x 3,no focal motor/sensory deficits.  Flat affect.  LABORATORY DATA:  I have reviewed the data as listed CMP Latest Ref Rng & Units 02/22/2019 02/21/2019 02/20/2019  Glucose 70 - 99 mg/dL 169(H) 187(H) 181(H)  BUN 8 - 23 mg/dL 20 21 19   Creatinine 0.61 - 1.24 mg/dL 0.36(L) 0.35(L) 0.36(L)  Sodium 135 - 145 mmol/L 136 134(L) 132(L)  Potassium 3.5 - 5.1 mmol/L 4.0 4.2 4.0  Chloride 98 - 111 mmol/L  102 102 101  CO2 22 - 32 mmol/L 25 26 22   Calcium 8.9 - 10.3 mg/dL 8.1(L) 8.1(L) 8.1(L)  Total Protein 6.5 - 8.1 g/dL 5.1(L) - -  Total Bilirubin 0.3 - 1.2 mg/dL 0.1(L) - -  Alkaline Phos 38 - 126 U/L 82 - -  AST 15 - 41 U/L 19 - -  ALT 0 - 44 U/L 36 - -    Lab Results  Component Value Date   WBC 3.5 (L) 02/22/2019   HGB 8.2 (L) 02/22/2019   HCT 26.8 (L) 02/22/2019   MCV 100.0 02/22/2019   PLT 109 (L) 02/22/2019   NEUTROABS 2.6 02/22/2019    Ct Abdomen Pelvis W Contrast  Result Date: 02/19/2019 CLINICAL DATA:  Burkitt's lymphoma. Failure to thrive. Fluid collections along the colon. Post bowel resection EXAM: CT ABDOMEN AND PELVIS WITH CONTRAST TECHNIQUE: Multidetector CT imaging of the abdomen and pelvis was performed using the standard protocol following bolus administration of intravenous contrast. CONTRAST:  166m OMNIPAQUE IOHEXOL 300 MG/ML  SOLN COMPARISON:  CT 02/14/2019 FINDINGS: Lower chest: No pneumonia at the lung bases. Hepatobiliary: No focal hepatic lesion. No hepatic abscess. Gallbladder normal. Pancreas: Pancreas is normal. No ductal dilatation. No pancreatic inflammation. Spleen: Normal spleen Adrenals/urinary tract: Adrenal glands normal. Small hypodense lesions in the kidney likely represent benign cysts no evidence of pyelonephritis. No hydronephrosis bladder normal Stomach/Bowel: Stomach, duodenum normal. Loop ileostomy in the mid RIGHT abdomen. Towards the terminal ileum the last 6 cm of small bowel demonstrate mucosal enhancement and fluid within lumen. The ascending colon demonstrates thickened nodular mucosa. There is no fluid collection surrounding the ascending colon. The transverse colon is mildly thick-walled with enhancement. The descending colon does demonstrate a pericolonic fluid collection beneath the serosal surface. There is adherence of the descending colon to the lateral abdominal wall in the mid descending colon (image 41/2). This is contiguous with the  fluid collection surrounding the descending colon and proximal sigmoid colon which may communicate with the distal ileum at several locations (image 62/5 and image 89/5. Rectum normal. The fluid collection extending along the descending colon is similar to comparison exam. The fluid collection at the junction of the sigmoid colon and small bowel is decreased in volume. There is no clear evidence of extraluminal abscess. No evidence of perforation. Vascular/Lymphatic: Abdominal aorta is normal caliber. No periportal or retroperitoneal adenopathy. No pelvic adenopathy. Reproductive: Prostate normal Other: No free fluid. Musculoskeletal: No aggressive osseous lesion. IMPRESSION: 1. Persistent mucosal thickening and enhancement involving the entire colon consistent with pancolitis. 2. Fluid within the bowel wall of the descending colon appears to communicate with the distal ileum ileum at the level the sigmoid colon. The fluid collection is less prominent at the level of the sigmoid colon compared to exam 5 days prior. 3. Serosal surface of the descending colon is adherent to the lateral abdominal wall peritoneal surface. 4. No intraperitoneal free air.  No extraluminal abscess. Electronically Signed   By: SSuzy BouchardM.D.   On: 02/19/2019 15:56   Ct Abdomen Pelvis W Contrast  Result Date: 02/14/2019 CLINICAL DATA:  Left-sided abdominal pain for several days EXAM: CT ABDOMEN AND PELVIS WITH CONTRAST TECHNIQUE: Multidetector CT imaging of the abdomen and pelvis was performed using the standard protocol following bolus administration of intravenous contrast. CONTRAST:  166m OMNIPAQUE 300 COMPARISON:  01/01/2019 FINDINGS: Lower chest: Mild basilar atelectasis is noted. Hepatobiliary: Stable in appearance from the recent exam. Pancreas: Unremarkable. No pancreatic ductal dilatation or surrounding inflammatory changes. Spleen: Normal in size without focal abnormality. Adrenals/Urinary Tract: Adrenal glands are  within normal limits. Scattered small cysts are noted bilaterally. A nonobstructing right renal stone is seen. The ureters are within normal limits. The bladder is partially compressed. Stomach/Bowel: The colon again demonstrates diffuse wall thickening consistent with colitis. There is a small fluid collection identified adjacent to the mid descending colon which measures approximately 2 x 1.5 cm in greatest dimension. A slightly larger fluid collection is noted surrounding sigmoid colon and extending towards the cecum in the right hemipelvis. This lies in an area of previously seen air-filled pockets likely representing a confluent abscess. This measures at least 8 cm in greatest transverse dimension but measures only approximately 2.6 cm in AP dimension. More proximal small bowel is within normal limits. Loop ileostomy is noted in the right mid abdomen. Vascular/Lymphatic: Aortic atherosclerosis. No enlarged abdominal or pelvic lymph nodes. Reproductive: Prostate is unremarkable. Other: No abdominal wall hernia or abnormality. No abdominopelvic ascites. Musculoskeletal: No acute or significant osseous findings. IMPRESSION: Multifocal fluid collections along the course of the descending and sigmoid colon as described consistent with developing abscesses. The largest of these lies along the margin of the sigmoid colon as described above. These changes are new from prior exam. Nonobstructing right renal stone stable from the prior exam. These results will be called to the ordering clinician or representative by the Radiologist Assistant, and communication documented in the PACS or zVision Dashboard. Electronically Signed   By: MInez CatalinaM.D.   On: 02/14/2019 12:48   Ct Aspiration  Result Date: 02/22/2019 INDICATION: Pancolitis and history of high-grade lymphoma. Slowly improving pericolonic fluid collections and request for aspiration of one of the collections for diagnostic purposes as an infectious source  has not been able to be isolated by other means. EXAM: CT-GUIDED ASPIRATION OF LEFT LOWER QUADRANT PERITONEAL FLUID COLLECTION MEDICATIONS: The patient is currently admitted to the hospital and receiving intravenous antibiotics. The antibiotics were administered within an appropriate time frame prior to the initiation of the procedure. ANESTHESIA/SEDATION: Fentanyl 100 mcg IV; Versed 2.0 mg IV Moderate Sedation Time:  15 minutes. The patient was continuously monitored during the procedure by the interventional radiology nurse under my direct supervision. COMPLICATIONS: None immediate. PROCEDURE: Informed written consent was obtained from the patient after a thorough discussion of the procedural risks, benefits and alternatives. All questions were addressed. Maximal Sterile Barrier Technique was utilized including caps, mask, sterile gowns, sterile gloves, sterile drape, hand hygiene and skin antiseptic. A timeout was performed prior to the initiation of the procedure. CT was performed through the abdomen and pelvis in a supine position. After choosing a site for aspiration, the left lower abdominal wall was prepped with chlorhexidine and local anesthesia was provided with 1% lidocaine. An 18 gauge trocar needle was advanced under CT guidance to the level of a pericolonic fluid collection anterior and lateral to the proximal sigmoid colon. Aspiration was performed. A fluid sample was obtained and sent for culture analysis including a aerobic/anaerobic culture and fungal culture. FINDINGS: The tiny collection lateral to the descending colon appears smaller and  was felt to be too small to obtain any diagnostic fluid from. There is some persistent fluid anterior and lateral to the proximal sigmoid colon that was targeted. There was return of 4 mL of bloody and turbid fluid from this area. IMPRESSION: CT-guided aspiration of pericolonic fluid adjacent to the sigmoid colon yielding 4 mL of bloody and turbid fluid. The  fluid sample was sent for culture analysis. Electronically Signed   By: Aletta Edouard M.D.   On: 02/22/2019 10:58   [place] Ekg Site Rite  Result Date: 02/19/2019 If Site Rite image not attached, placement could not be confirmed due to current cardiac rhythm.   ASSESSMENT AND PLAN: 1. 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  3. H/o Small bowel obstruction/ileuswith sigmoid thickening/stenosis . 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 EGreer Pickerel 4. 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 GI panel and C. difficile negative.  CMV IgM negative, EBV IgM negative. 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 Sed rate and CRP still elevated  5.Hypovolemic hyponatremia. Other electrolyte issues including hypokalemia and hypomagnesemia  6. Pancytopenia -  Anemia likely multifactorial but could be from GI bleeding plus significant inflammation from severe radiation colitis. Leukopenia/lymphopenia likely from radiation toxicity. Thrombocytopenia from consumption related to GI bleeding and from radiation toxicity.- nearly normalized. B12 within normal limits Copper levels within normal limits at 132 Zinc deficiency noted- 44 rpt this admission 60 Viral work-upnot revealing Low likelihood of Burkitt's lymphoma progression at this time.  7. Admitted with failure to thrive, dehydration electrolyte abnormalities and hyperglycemia with poor p.o. intake. 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  8. Depression - discussed with patient. Continue Celexa and optimize dose to 261min 1-2 weeks.  9. Symptomatic anemia -transfuse prn to maintain hgb>8 given symptoms and some GI losses.  10. Moderate-severe protein calorie malnutrition and muscle  waste  11. Hypogonadism - secondary. Very low testosterone levels.  PLAN -Labs reviewed with the patient.  Anticipate some blood count changes due to hemodilution from TPN. WBC stable. -Recommend PRBC transfusion for hemoglobin less than 8.0 due to ongoing GI blood losses.  Hemoglobin remains stable.    -optimize nutrition -Continue Oxandrolone 39m79mo BID -Eliquis on hold due to procedures.  -Appreciate excellent hospitalist care. -Monitor electrolytes phosphorus, potassium and magnesium replacement -continue Zinc replacement -continue TPN -optimize DM2 management- might need addition of basal insulin. -may need to consider ESA for anemia or chronic disease PT/OT evaluation, out of bed to chair. -Abdominal abscesses noted on CT scan. S/P CT guided aspiration of the peritoneal fluid today. Cultures pending. On TPN. Continue IV antibiotics. For colonoscopy and ileoscopy 6/12. -Ongoing high ileostomy output. GI following. Stool for C diff negative. GI panel pending.  Also consider talking with radiation oncology regarding any additional intervention recommended for treatment of radiation colitis. -Patient will D/C to SNF when medically stable.    LOS: 14 days   KriMikey BussingNP, AGPCNP-BC, AOCNP 02/22/19  ADDENDUM  .Patient was Personally and independently interviewed, examined and relevant elements of the history of present illness were reviewed in details and an assessment and plan was created. All elements of the patient's history of present illness , assessment and plan were discussed in details with KriMikey BussingNP. The above documentation reflects our combined findings assessment and plan.  GauSullivan Lone MS

## 2019-02-23 ENCOUNTER — Encounter (HOSPITAL_COMMUNITY)
Admission: EM | Disposition: A | Discharge status: Skilled Nursing Facility | Payer: Self-pay | Source: Home / Self Care | Attending: Internal Medicine

## 2019-02-23 LAB — BASIC METABOLIC PANEL
Anion gap: 8 (ref 5–15)
BUN: 20 mg/dL (ref 8–23)
CO2: 25 mmol/L (ref 22–32)
Calcium: 8.3 mg/dL — ABNORMAL LOW (ref 8.9–10.3)
Chloride: 103 mmol/L (ref 98–111)
Creatinine, Ser: 0.36 mg/dL — ABNORMAL LOW (ref 0.61–1.24)
GFR calc Af Amer: >60 mL/min (ref >60)
GFR calc non Af Amer: >60 mL/min (ref >60)
Glucose, Bld: 179 mg/dL — ABNORMAL HIGH (ref 70–99)
Potassium: 4 mmol/L (ref 3.5–5.1)
Sodium: 136 mmol/L (ref 135–145)

## 2019-02-23 LAB — CBC WITH DIFFERENTIAL/PLATELET
Abs Immature Granulocytes: 0.12 10*3/uL — ABNORMAL HIGH (ref 0.00–0.07)
Basophils Absolute: 0 10*3/uL (ref 0.0–0.1)
Basophils Relative: 1 %
Eosinophils Absolute: 0 10*3/uL (ref 0.0–0.5)
Eosinophils Relative: 1 %
HCT: 27 % — ABNORMAL LOW (ref 39.0–52.0)
Hemoglobin: 8.5 g/dL — ABNORMAL LOW (ref 13.0–17.0)
Immature Granulocytes: 4 %
Lymphocytes Relative: 11 %
Lymphs Abs: 0.4 10*3/uL — ABNORMAL LOW (ref 0.7–4.0)
MCH: 31.4 pg (ref 26.0–34.0)
MCHC: 31.5 g/dL (ref 30.0–36.0)
MCV: 99.6 fL (ref 80.0–100.0)
Monocytes Absolute: 0.4 10*3/uL (ref 0.1–1.0)
Monocytes Relative: 13 %
Neutro Abs: 2.4 10*3/uL (ref 1.7–7.7)
Neutrophils Relative %: 70 %
Platelets: 121 10*3/uL — ABNORMAL LOW (ref 150–400)
RBC: 2.71 MIL/uL — ABNORMAL LOW (ref 4.22–5.81)
RDW: 18.1 % — ABNORMAL HIGH (ref 11.5–15.5)
WBC: 3.4 10*3/uL — ABNORMAL LOW (ref 4.0–10.5)
nRBC: 0 % (ref 0.0–0.2)

## 2019-02-23 LAB — GLUCOSE, CAPILLARY
Glucose-Capillary: 195 mg/dL — ABNORMAL HIGH (ref 70–99)
Glucose-Capillary: 121 mg/dL — ABNORMAL HIGH (ref 70–99)
Glucose-Capillary: 111 mg/dL — ABNORMAL HIGH (ref 70–99)
Glucose-Capillary: 211 mg/dL — ABNORMAL HIGH (ref 70–99)

## 2019-02-23 LAB — PHOSPHORUS: Phosphorus: 3 mg/dL (ref 2.5–4.6)

## 2019-02-23 LAB — MAGNESIUM: Magnesium: 1.9 mg/dL (ref 1.7–2.4)

## 2019-02-23 SURGERY — COLONOSCOPY
Anesthesia: Monitor Anesthesia Care

## 2019-02-23 MED ORDER — TRAVASOL 10 % IV SOLN
INTRAVENOUS | Status: AC
  Administered 2019-02-23: 18:00:00 via INTRAVENOUS
  Filled 2019-02-23 (×2): qty 1254

## 2019-02-23 NOTE — Progress Notes (Signed)
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 Usual Weight: 190 lb - 15% weight loss in last month per RD  Current Nutrition: NPO, TPN  IVF: none  Central access: Port TPN start date: 5/29  ASSESSMENT                                                                                                          HPI: 63 yo male with burkitt's lymphoma s/p chemo now with severe radiation colitis, prolonged malnutrition, multiple recent admissions to start TPN per pharmacy management  Significant events:  6/3 repeat CT abd/pelvis> Descending and sigmoid colitis with fluid collections, not drainable by IR, Zosyn added, made NPO 6/9: apixaban d/c'd for plan for endoscopic eval in next few days. And possibly colonic resection in future per GI notes 6/11: CT guided aspiration of LLQ fluid per IR this AM  Today, 02/23/19  Glucose - goal < 326 while on cyclic TPN (tolerate higher goal CBGs while on cyclic) - 6 Units ZTI/45 hours;   CBGs 200, 195 during 12 hr cylcle of TPN w/ 40 units of insulin.  CBGs while TPN off 114, 119, 121 1 hr after TPN turned off this AM. Note hx DM and chronic dexamethasone 2 mg qday > decreased to 1 mg/day 6/2.  Electrolytes - WNL  Renal - WNL  LFTs - AST/ALT stable/WNL  TGs - 71 (6/1), 98 (6/8)  Prealbumin - 7.7 (6/1), 16 (6/8) oxandrin added 5/29, dose increased 6/1  NUTRITIONAL GOALS                                                                                             RD recs: 2300-2500 kcal/day, 115-125g protein/day  Custom TPN at goal of 95 ml/hr over 80DXI or 12 hr cyclic administration getting 2280 ml to provide: 125 g/day  protein, 2270Kcal/day.  PLAN  At 1800 today:  Continue custom cyclic TPN with 9753 mls/day over 12 hours = full support  TPN to contain standard multivitamins daily and trace elements only on MWF due to national shortage.  Standard TPN concentrations of lytes: Na at 125 mEq/L, K at 45, Mag 8, YY:FRTM 1:1 and increased phos at 20  IVF: none  Continue modSSI/CBGs - custom schedule for cyclic TPN  Continue current insulin amount of 40 units in TPN bag - trying not to increase insulin further in TPN bag to prevent subsequent hypoglycemia when TPN turned off at 0600  TPN lab panels on Mondays & Thursdays.     Adrian Saran, PharmD, BCPS  02/23/2019 9:34 AM

## 2019-02-23 NOTE — Progress Notes (Signed)
Updated patient's wife via telephone regarding plan of care.

## 2019-02-23 NOTE — Progress Notes (Addendum)
[facility] Progress Note     Subjective: CC-  Feels the same as he has for over 1 week. Reports persistent LLQ pain. Denies n/v. Ostomy functioning.  States that someone from GI spoke with his this morning and is cancelling colonoscopy and ileoscopy today.  Objective: Vital signs in last 24 hours: Temp:  [97.6 F (36.4 C)-98.8 F (37.1 C)] 98.8 F (37.1 C) (06/12 0642) Pulse Rate:  [85-98] 98 (06/12 0642) Resp:  [11-16] 14 (06/12 0642) BP: (104-118)/(70-81) 110/70 (06/12 0642) SpO2:  [97 %-100 %] 99 % (06/12 0642) Weight:  [69 kg] 69 kg (06/11 1321) Last BM Date: 02/22/19  Intake/Output from previous day: 06/11 0701 - 06/12 0700 In: 2527.8 [P.O.:120; I.V.:2257.9; IV Piggyback:149.9] Out: 2050 [Urine:1500; Stool:550] Intake/Output this shift: Total I/O In: -  Out: 375 [Urine:375]  PE: Gen: Alert, NAD, cooperative HEENT: EOM's intact, pupils equal and round Pulm: effort normal Abd: Soft,nondistended, mild LLQ TTP without peritonitis,ostomy viable with air and green/bright red blood-tinged liquid stoolin bag Skin: warm and dry    Lab Results:  Recent Labs    02/22/19 0440 02/23/19 0336  WBC 3.5* 3.4*  HGB 8.2* 8.5*  HCT 26.8* 27.0*  PLT 109* 121*   BMET Recent Labs    02/22/19 0440 02/23/19 0336  NA 136 136  K 4.0 4.0  CL 102 103  CO2 25 25  GLUCOSE 169* 179*  BUN 20 20  CREATININE 0.36* 0.36*  CALCIUM 8.1* 8.3*   PT/INR Recent Labs    02/22/19 0440  LABPROT 12.9  INR 1.0   CMP     Component Value Date/Time   NA 136 02/23/2019 0336   K 4.0 02/23/2019 0336   CL 103 02/23/2019 0336   CO2 25 02/23/2019 0336   GLUCOSE 179 (H) 02/23/2019 0336   BUN 20 02/23/2019 0336   CREATININE 0.36 (L) 02/23/2019 0336   CREATININE 0.78 01/25/2019 1050   CREATININE 1.26 12/01/2012 1700   CALCIUM 8.3 (L) 02/23/2019 0336   PROT 5.1 (L) 02/22/2019 0440   ALBUMIN 2.0 (L) 02/22/2019 0440   AST 19 02/22/2019 0440   AST 18 01/25/2019 1050    ALT 36 02/22/2019 0440   ALT 59 (H) 01/25/2019 1050   ALKPHOS 82 02/22/2019 0440   BILITOT 0.1 (L) 02/22/2019 0440   BILITOT 0.6 01/25/2019 1050   GFRNONAA >60 02/23/2019 0336   GFRNONAA >60 01/25/2019 1050   GFRAA >60 02/23/2019 0336   GFRAA >60 01/25/2019 1050   Lipase     Component Value Date/Time   LIPASE 19 08/17/2018 1738       Studies/Results: Ct Aspiration  Result Date: 02/22/2019 INDICATION: Pancolitis and history of high-grade lymphoma. Slowly improving pericolonic fluid collections and request for aspiration of one of the collections for diagnostic purposes as an infectious source has not been able to be isolated by other means. EXAM: CT-GUIDED ASPIRATION OF LEFT LOWER QUADRANT PERITONEAL FLUID COLLECTION MEDICATIONS: The patient is currently admitted to the hospital and receiving intravenous antibiotics. The antibiotics were administered within an appropriate time frame prior to the initiation of the procedure. ANESTHESIA/SEDATION: Fentanyl 100 mcg IV; Versed 2.0 mg IV Moderate Sedation Time:  15 minutes. The patient was continuously monitored during the procedure by the interventional radiology nurse under my direct supervision. COMPLICATIONS: None immediate. PROCEDURE: Informed written consent was obtained from the patient after a thorough discussion of the procedural risks, benefits and alternatives. All questions were addressed. Maximal Sterile Barrier Technique was utilized including caps, mask, sterile  gowns, sterile gloves, sterile drape, hand hygiene and skin antiseptic. A timeout was performed prior to the initiation of the procedure. CT was performed through the abdomen and pelvis in a supine position. After choosing a site for aspiration, the left lower abdominal wall was prepped with chlorhexidine and local anesthesia was provided with 1% lidocaine. An 18 gauge trocar needle was advanced under CT guidance to the level of a pericolonic fluid collection anterior and  lateral to the proximal sigmoid colon. Aspiration was performed. A fluid sample was obtained and sent for culture analysis including a aerobic/anaerobic culture and fungal culture. FINDINGS: The tiny collection lateral to the descending colon appears smaller and was felt to be too small to obtain any diagnostic fluid from. There is some persistent fluid anterior and lateral to the proximal sigmoid colon that was targeted. There was return of 4 mL of bloody and turbid fluid from this area. IMPRESSION: CT-guided aspiration of pericolonic fluid adjacent to the sigmoid colon yielding 4 mL of bloody and turbid fluid. The fluid sample was sent for culture analysis. Electronically Signed   By: Aletta Edouard M.D.   On: 02/22/2019 10:58    Anti-infectives: Anti-infectives (From admission, onward)   Start     Dose/Rate Route Frequency Ordered Stop   02/14/19 1400  piperacillin-tazobactam (ZOSYN) IVPB 3.375 g     3.375 g 12.5 mL/hr over 240 Minutes Intravenous Every 8 hours 02/14/19 1342         Assessment/Plan Hx PE/DVT - on Eliquis(last dose 6/9) Dehydration PCM - prealbumin 6.5>>7.7>>16- on TPN Palliative care - DNR Pancytopenia  FTT/ severe deconditioning  - dehydration/electrolyte abnormalities  Type II diabetes Depression   Descending and sigmoid colitis with fluid collections  -IRdoesn't feel it is drainablebased on CT scan 6/3 - GI paneland c diffneg for infectious origin  - Dr. 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 Diverting loop ileostomy - 08/26/2019 - [facility] - for sigmoid colon stricture  FEN: NPO,TPN ID: Zosyn 6/3>> DVT: SCD's Follow up: TBD POC: Stelzer,[name] Spouse [phone]  [phone]   Plan:GI postponing colonoscopy and ileoscopy today. Follow culture from IR aspiration yesterday. GI plans to attempt procedure next week to confirm diagnosis. He does not seem to be making much progress.   If unchanged next week may want to consider goals of care discussion with palliative care, as the patient has expressed that he may not ever wish to undergo a surgical procedure even if colitis is not resolving.    LOS: 15 days    Wellington Hampshire , [facility] 02/23/2019, 9:04 AM Pager: [phone]  Agree with above. He does not have that much tenderness.  He says his left sided abdominal pain is 4/10. Colonoscopy delayed for now. We will reassess on Monday.  Alphonsa Overall, MD, [facility] Pager: [phone] Office phone:  [phone]

## 2019-02-23 NOTE — Plan of Care (Signed)
Pt refusing to get OOB, increasing his risk for deconditioning. Pt taking very little PO. Pt non compliant with repositioning.

## 2019-02-23 NOTE — Progress Notes (Signed)
PROGRESS NOTE  Mark Frey RDE:[id number] DOB: 1956/01/10 DOA: 02/07/2019 PCP: [name], MD  Brief History   Patient is a 63 year old male with history of Burkitt's lymphoma status post radiation and chemotherapy, status post ileostomy, diabetes mellitus, radiation colitis PE/DVT on Eliquis who presents with worsening weakness from home.  On his baseline, is able to ambulate independently with a walker but more recently he was unable to stand on his own. Patient has been started on TPN for failure to thrive.  Oncology was following.  Patient was complaining of persistent abdominal discomfort.  CT abdomen/pelvis done here showed Multifocal fluid collections along the course of the descending and sigmoid colon  consistent with developing abscesses.Started on antibiotics.General surgery/IR following.  CT abdomen and pelvis was performed on 02/19/2019 that revealed persistent mucosal thickening and enhancement involving the entire colon consistent with pancolitis. There appeared to be a communicating fluid filled tract between the distal ileum to the sigmoid colon. General surgery offered operative solution via total colectomy with ileostomy. The patient declined at this point. He has also declined endoscopic procedures that would be necessary prior to surgery at this point. GI plans to try for endoscopy again next week.  IR was consulted and the patient underwent percutaneous drainage of abscess collection on 02/22/2019. 4 cc of fluid was obtained and was sent for culture. IR does not feel that the abscesses are "drainable".  Plan to recall palliative care next week as currently the patient is refusing to be turned, taking very little PO, and refusing to get up out of bed, unless the patient changes his approach to attempts to investigate and treat him.  The patient is receiving TPN as managed by pharmacy.  Consultants  . General Surgery . Gastroenterology . Palliative Care  Procedures   . Percutaneous drainage of pericolonic abscess collection by IR on 02/22/2019  Antibiotics  . Zosyn  Subjective  The patient is resting in bed. He states that his abdominal pain is unchanged. No new complaints.  Objective   Vitals:  Vitals:   02/23/19 0642 02/23/19 1343  BP: 110/70 109/75  Pulse: 98 91  Resp: 14 10  Temp: 98.8 F (37.1 C) 98.5 F (36.9 C)  SpO2: 99% 96%    Exam:  Constitutional:  . The patient is awake, alert, and oriented x 3. No acute distress. Respiratory:  . No increased work of breathing.  . No wheezes, rales, or rhonchi.  . No tactile fremitus. Cardiovascular:  . Regular rate and rhythm.  . No murmurs, ectopy, or gallups are appreciated. . No lateral PMI. No thrills. . No LE extremity edema   Abdomen:  . Abdomen is soft, Right sided tenderness, non-distended . No hernias, masses or organomegaly are appreciated. [name] Hypoactive bowel sounds. Musculoskeletal:  . No cyanosis, clubbing, or edema Skin:  . No rashes, lesions, ulcers . palpation of skin: no induration or nodules Neurologic:  . Pt is unable to cooperate with exam. Psychiatric:  . Mental status - Depressed  I have personally reviewed the following:   Today's Data   CBC    Component Value Date/Time   WBC 3.4 (L) 02/23/2019 0336   RBC 2.71 (L) 02/23/2019 0336   HGB 8.5 (L) 02/23/2019 0336   HGB 8.7 (L) 01/25/2019 1050   HCT 27.0 (L) 02/23/2019 0336   HCT 24.0 (L) 02/08/2019 1635   PLT 121 (L) 02/23/2019 0336   PLT 139 (L) 01/25/2019 1050   MCV 99.6 02/23/2019 0336   MCH 31.4  02/23/2019 0336   MCHC 31.5 02/23/2019 0336   RDW 18.1 (H) 02/23/2019 0336   LYMPHSABS 0.4 (L) 02/23/2019 0336   MONOABS 0.4 02/23/2019 0336   EOSABS 0.0 02/23/2019 0336   BASOSABS 0.0 02/23/2019 0336   BMP Latest Ref Rng & Units 02/23/2019 02/22/2019 02/21/2019  Glucose 70 - 99 mg/dL 179(H) 169(H) 187(H)  BUN 8 - 23 mg/dL 20 20 21   Creatinine 0.61 - 1.24 mg/dL 0.36(L) 0.36(L) 0.35(L)  Sodium 135  - 145 mmol/L 136 136 134(L)  Potassium 3.5 - 5.1 mmol/L 4.0 4.0 4.2  Chloride 98 - 111 mmol/L 103 102 102  CO2 22 - 32 mmol/L 25 25 26   Calcium 8.9 - 10.3 mg/dL 8.3(L) 8.1(L) 8.1(L)    Micro Data  . Body fluid culture pending.  Imaging  . CT abdomen/pelvis  Scheduled Meds: . cholestyramine light  4 g Oral 3 times per day  . citalopram  10 mg Oral Daily  . dexamethasone  1 mg Oral Q breakfast  . feeding supplement  1 Container Oral Q24H  . feeding supplement (PRO-STAT SUGAR FREE 64)  30 mL Oral Daily  . insulin aspart  0-15 Units Subcutaneous 4 times per day  . nutrition supplement (JUVEN)  1 packet Oral BID BM  . oxandrolone  5 mg Oral BID  . Short-Chain Fatty Acids Soln (60 mL)  60 mL Per Tube BID  . zinc sulfate  220 mg Oral BID   Continuous Infusions: . sodium chloride 250 mL (02/22/19 1724)  . piperacillin-tazobactam (ZOSYN)  IV 3.375 g (02/23/19 1339)  . TPN CYCLIC-ADULT (ION)      Principal Problem:   Pancolitis ([place]) Active Problems:   Controlled diabetes mellitus type II without complication (HCC)   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HCC)   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HCC)   Deep vein thrombosis (DVT) of femoral vein of right lower extremity (HCC)   Diabetes mellitus (HCC)   Bowel obstruction in setting of lymphoma and sigmoid stricture   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HCC)   Hyponatremia   Malnutrition of moderate degree   Dehydration   Palliative care by specialist   Goals of care, counseling/discussion   Symptomatic anemia   Atrophy of muscle of multiple sites   Depression   Abnormal CT scan, gastrointestinal tract   Thrombocytopenia (HCC)   Abscess of sigmoid colon   Loop ilesotomy for fecal diversion of sigmoid stricture   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)   LOS: 15 days    A & P   Abdominal abscesses: 4 cc of turbid fluid aspirated yesterday by IR who feels that the abscess is not ultimately  "drainable".Patient continues to complain of persistent abdominal discomfort.  CT abdomen/pelvis done showed Multifocal fluid collections along the course of the descending and sigmoid colon consistent with developing abscesses.Culture of aspirated fluid has grown out a few GNR. He is on zosyn. Negative  blood cultures.Patient is not septic. He is NPO and receiving TPN.  CT abdomen/pelvis as above.  Patient denied colectomy by surgery, althoug apparently today he stated that he would consider if it there were no other option.  For now he has also declined endoscopic procedures that are necessary to prepare for possible surgery.   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 Oncology following.  Currently in remission  History of a small bowel obstruction/ileus: Status post diverting ileostomy on 08/25/2018. He is having stool output from the  ileostomy bag.  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 GI panel, C. difficile negative. Elevated ESR and CRP.GI following. CMV DNA negative.  Continue cholestyramine. GI planning for short chain fatty acid enema but patient is reluctant .  Pancytopenia: Multifactorial. Hb this morning is 8.5.  He has been transfused with 3 units of  PRBCs so far.  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  Also has thrombocytopenia, although platelets are somewhat improved at 121 this morning.  Failure to thrive/dehydration/electrolyte abnormalities: Due to low oral intake, high ostomy output.  Started on TPN on 02/09/2019. Expected bowel dysfunction for weeks to months. Continue TPN.  Electrolytes being monitored and supplemented.  Diabetes type 2: Continue sliding scale insulin. Monitor CBGs.  History of right lower extremity DVT/bilateral PE: On eliquis.  Depression: Continue Celexa  Debility/deconditioning: Plan is to discharge to skilled nursing facility with TPN when  medically stable.He has a PICC line line on left UE  He has been accepted at [facility] skilled nursing facility.    Nutrition Problem: Increased nutrient needs Etiology: chronic illness, cancer and cancer related treatments  I have seen and examined this patient myself. I have spent 32 minutes in his evaluation and care.  DVT prophylaxis: Eliquis Code Status: DNR Family Communication: None available. Disposition Plan: Skilled nursing facility after medical stabilization  Trachelle Low, DO [facility] Direct contact: see [email or web address]  7PM-7AM contact night coverage as above

## 2019-02-23 NOTE — Progress Notes (Signed)
Progress Note   Subjective  Patient feels about the same. Ongoing discomfort in the LLQ is not much changed since I have seen him last week. IR sampled fluid collection yesterday, culture pending.    Objective   Vital signs in last 24 hours: Temp:  [97.6 F (36.4 C)-98.8 F (37.1 C)] 98.8 F (37.1 C) (06/12 0642) Pulse Rate:  [85-98] 98 (06/12 0642) Resp:  [11-16] 14 (06/12 0642) BP: (104-118)/(70-81) 110/70 (06/12 0642) SpO2:  [97 %-100 %] 99 % (06/12 0642) Weight:  [24 kg] 69 kg (06/11 1321) Last BM Date: 02/22/19 General:    white male in NAD Heart:  Regular rate and rhythm;  Lungs: Respirations even and unlabored, l Abdomen:  Soft, nondistended, ostomy in RLQ, ttp in LLQ, no peritoneal signs.  Extremities:  Without edema. Neurologic:  Alert and oriented,  grossly normal neurologically. Psych:  Cooperative. Normal mood and affect.  Intake/Output from previous day: 06/11 0701 - 06/12 0700 In: 2527.8 [P.O.:120; I.V.:2257.9; IV Piggyback:149.9] Out: 2050 [Urine:1500; Stool:550] Intake/Output this shift: Total I/O In: -  Out: 375 [Urine:375]  Lab Results: Recent Labs    02/21/19 0504 02/22/19 0440 02/23/19 0336  WBC 3.7* 3.5* 3.4*  HGB 8.2* 8.2* 8.5*  HCT 26.8* 26.8* 27.0*  PLT 110* 109* 121*   BMET Recent Labs    02/21/19 0504 02/22/19 0440 02/23/19 0336  NA 134* 136 136  K 4.2 4.0 4.0  CL 102 102 103  CO2 26 25 25   GLUCOSE 187* 169* 179*  BUN 21 20 20   CREATININE 0.35* 0.36* 0.36*  CALCIUM 8.1* 8.1* 8.3*   LFT Recent Labs    02/22/19 0440  PROT 5.1*  ALBUMIN 2.0*  AST 19  ALT 36  ALKPHOS 82  BILITOT 0.1*   PT/INR Recent Labs    02/22/19 0440  LABPROT 12.9  INR 1.0    Studies/Results: Ct Aspiration  Result Date: 02/22/2019 INDICATION: Pancolitis and history of high-grade lymphoma. Slowly improving pericolonic fluid collections and request for aspiration of one of the collections for diagnostic purposes as an infectious source  has not been able to be isolated by other means. EXAM: CT-GUIDED ASPIRATION OF LEFT LOWER QUADRANT PERITONEAL FLUID COLLECTION MEDICATIONS: The patient is currently admitted to the hospital and receiving intravenous antibiotics. The antibiotics were administered within an appropriate time frame prior to the initiation of the procedure. ANESTHESIA/SEDATION: Fentanyl 100 mcg IV; Versed 2.0 mg IV Moderate Sedation Time:  15 minutes. The patient was continuously monitored during the procedure by the interventional radiology nurse under my direct supervision. COMPLICATIONS: None immediate. PROCEDURE: Informed written consent was obtained from the patient after a thorough discussion of the procedural risks, benefits and alternatives. All questions were addressed. Maximal Sterile Barrier Technique was utilized including caps, mask, sterile gowns, sterile gloves, sterile drape, hand hygiene and skin antiseptic. A timeout was performed prior to the initiation of the procedure. CT was performed through the abdomen and pelvis in a supine position. After choosing a site for aspiration, the left lower abdominal wall was prepped with chlorhexidine and local anesthesia was provided with 1% lidocaine. An 18 gauge trocar needle was advanced under CT guidance to the level of a pericolonic fluid collection anterior and lateral to the proximal sigmoid colon. Aspiration was performed. A fluid sample was obtained and sent for culture analysis including a aerobic/anaerobic culture and fungal culture. FINDINGS: The tiny collection lateral to the descending colon appears smaller and was felt to be too small to  obtain any diagnostic fluid from. There is some persistent fluid anterior and lateral to the proximal sigmoid colon that was targeted. There was return of 4 mL of bloody and turbid fluid from this area. IMPRESSION: CT-guided aspiration of pericolonic fluid adjacent to the sigmoid colon yielding 4 mL of bloody and turbid fluid. The  fluid sample was sent for culture analysis. Electronically Signed   By: Aletta Edouard M.D.   On: 02/22/2019 10:58       Assessment / Plan:    63 y/o male with a history of Burkitts lymphoma in 6659, complicated course with chemotherapy and XRT, severe sigmoid stricture leading to surgery and loop ileostomy. His course has been complicated by severe colitis thought to be mainly due to radiation, with possible component of diversion colitis. Previously on short chain fatty acid enemas, he has developed abscesses and has been on broad spectrum antibiotics and bowel rest here for the past few weeks.  Overall he is not much improved since I saw him last week. Assuming this is radiation colitis, we don't have good medical options for therapy, would need to consider colectomy which would be a difficult operation for him. He has declined another trial of short chain fatty acid enemas. To confirm etiology prior to consideration for surgery, endoscopic evaluation is recommended and was offered to him and discussed yesterday by Dr. Rush Landmark at length. He initially agreed to proceed and then changed his mind. Today we discussed his course at length and options again. He wants to think more about things, is holding off on endoscopy today. He is open to surgery if there are no other options to treat this and he does not improved with conservative measures, and understands that endoscopy is part of this evaluation. Will await fluid culture results for now and continue antibiotics over the weekend. We will reassess him on Monday for consideration of endoscopic evaluation next week. He agreed. Call with questions in the interim.   Dillard Cellar, MD [facility]

## 2019-02-23 NOTE — Progress Notes (Signed)
Occupational Therapy Treatment Patient Details Name: Mark Frey MRN: [id number] DOB: Apr 05, 1956 Today's Date: 02/23/2019    History of present illness 63 y.o. 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, s/p ileostomy, diabetes mellitus, type 2, radiation colitis, PE/DVT on Eliquis and admitted for dehydration and generalized weakness   OT comments  Pt did participate with BUE AROM but declined OOB even with MAX encouragment  Follow Up Recommendations  SNF;Supervision/Assistance - 24 hour          Precautions / Restrictions Restrictions Weight Bearing Restrictions: No       Mobility Bed Mobility               General bed mobility comments: refused  Transfers                 General transfer comment: refused        ADL either performed or assessed with clinical judgement        Vision Patient Visual Report: No change from baseline            Cognition Arousal/Alertness: Awake/alert Behavior During Therapy: WFL for tasks assessed/performed;Flat affect Overall Cognitive Status: Within Functional Limits for tasks assessed                                 General Comments: quiet today, feels "tired"         Exercises General Exercises - Upper Extremity Shoulder Flexion: AROM;20 reps;Supine Elbow Flexion: AROM;10 reps;Supine Elbow Extension: AROM;Both;Supine   Shoulder Instructions       General Comments      Pertinent Vitals/ Pain       Pain Score: 3  Pain Location: ABD LLQ Pain Descriptors / Indicators: Discomfort;Grimacing             End of Session    OT Visit Diagnosis: Unsteadiness on feet (R26.81);Muscle weakness (generalized) (M62.81);Adult, failure to thrive (R62.7);Pain   Activity Tolerance Other (comment)(self limiting)   Patient Left in bed;with call bell/phone within reach   Nurse Communication Other (comment)(lack of willingless to get OOB)         Time: 1010-1020 OT Time Calculation (min): 10 min  Charges: OT General Charges $OT Visit: 1 Visit OT Treatments $Therapeutic Activity: 8-22 mins  Kari Baars, [place] [phone] Office- [place], Edwena Felty D 02/23/2019, 12:16 PM

## 2019-02-24 LAB — GLUCOSE, CAPILLARY
Glucose-Capillary: 177 mg/dL — ABNORMAL HIGH (ref 70–99)
Glucose-Capillary: 97 mg/dL (ref 70–99)
Glucose-Capillary: 80 mg/dL (ref 70–99)
Glucose-Capillary: 95 mg/dL (ref 70–99)

## 2019-02-24 MED ORDER — SODIUM CHLORIDE 0.9 % IV SOLN
2.0000 g | Freq: Three times a day (TID) | INTRAVENOUS | Status: DC
  Administered 2019-02-24 – 2019-02-27 (×8): 2 g via INTRAVENOUS
  Filled 2019-02-24 (×10): qty 2

## 2019-02-24 MED ORDER — TRAVASOL 10 % IV SOLN
INTRAVENOUS | Status: AC
  Administered 2019-02-24: 18:00:00 via INTRAVENOUS
  Filled 2019-02-24: qty 1254

## 2019-02-24 NOTE — Progress Notes (Signed)
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 Usual Weight: 190 lb - 15% weight loss in last month per RD  Current Nutrition: NPO, TPN  IVF: none  Central access: Port TPN start date: 5/29  ASSESSMENT                                                                                                          HPI: 63 yo male with burkitt's lymphoma s/p chemo now with severe radiation colitis, prolonged malnutrition, multiple recent admissions to start TPN per pharmacy management  Significant events:  6/3 repeat CT abd/pelvis> Descending and sigmoid colitis with fluid collections, not drainable by IR, Zosyn added, made NPO 6/9: apixaban d/c'd for plan for endoscopic eval in next few days. And possibly colonic resection in future per GI notes 6/11: CT guided aspiration of LLQ fluid per IR this AM 6/13: colonoscopy and ileoscopy postponed yesterday, to re-evaluate next week, possible palliative consult soon pending patient's decisions  Today, 02/24/19  Glucose - goal < 001 while on cyclic TPN (tolerate higher goal CBGs while on cyclic) - 8 Units VCB/44 hours;   CBGs 211, 177 during 12 hr cylcle of TPN w/ 40 units of insulin.  CBGs while TPN off 121, 111, 97 1 hr after TPN turned off this AM. Note hx DM and chronic dexamethasone 2 mg qday > decreased to 1 mg/day 6/2.  Electrolytes - WNL on 6/12  Renal - WNL on 6/12  LFTs - AST/ALT stable/WNL on 6/12  TGs - 71 (6/1), 98 (6/8)  Prealbumin - 7.7 (6/1), 16 (6/8) oxandrin added 5/29, dose increased 6/1  NUTRITIONAL GOALS                                                                                              RD recs: 2300-2500 kcal/day, 115-125g protein/day  Custom TPN at goal of 95 ml/hr over 96PRF or 12 hr cyclic administration getting 2280 ml to provide: 125 g/day protein, 2270Kcal/day.  PLAN  At 1800 today:  Continue custom cyclic TPN with 1222 mls/day over 12 hours = full support  TPN to contain standard multivitamins daily and trace elements only on MWF due to national shortage.  Standard TPN concentrations of lytes: Na at 125 mEq/L, K at 45, Mag 8, IV:HOYW 1:1 and increased phos at 20  IVF: none  Continue modSSI/CBGs - custom schedule for cyclic TPN  Continue current insulin amount of 40 units in TPN bag - trying not to increase insulin further in TPN bag to prevent subsequent hypoglycemia when TPN turned off at 0600  TPN lab panels on Mondays & Thursdays.     Adrian Saran, PharmD, BCPS  02/24/2019 9:33 AM

## 2019-02-24 NOTE — Progress Notes (Signed)
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  On his baseline, is able to ambulate independently with a walker but more recently he was unable to stand on his own. Patient has been started on TPN for failure to thrive.  Oncology was following.  Patient was complaining of persistent abdominal discomfort.  CT abdomen/pelvis done here showed Multifocal fluid collections along the course of the descending and sigmoid colon  consistent with developing abscesses.Started on antibiotics.General surgery/IR following.  CT abdomen and pelvis was performed on 02/19/2019 that revealed persistent mucosal thickening and enhancement involving the entire colon consistent with pancolitis. There appeared to be a communicating fluid filled tract between the distal ileum to the sigmoid colon. General surgery offered operative solution via total colectomy with ileostomy. The patient declined at this point. He has also declined endoscopic procedures that would be necessary prior to surgery at this point. GI plans to try for endoscopy again next week.  IR was consulted and the patient underwent percutaneous drainage of abscess collection on 02/22/2019. 4 cc of fluid was obtained and was sent for culture. IR does not feel that the abscesses are "drainable".  Plan to recall palliative care next week as currently the patient is refusing to be turned, taking very little PO, and refusing to get up out of bed, unless the patient changes his approach to attempts to investigate and treat him.  The patient is receiving TPN as managed by pharmacy.  Consultants  . General Surgery . Gastroenterology . Palliative Care  Procedures   . Percutaneous drainage of pericolonic abscess collection by IR on 02/22/2019  Antibiotics  . Zosyn  Subjective  The patient is resting in bed. He continues to complain of abdominal pain and bloating.   Objective   Vitals:  Vitals:   02/24/19 0617 02/24/19 1444  BP: 106/69 106/74  Pulse: 93 88  Resp: 18 18  Temp: 98.3 F (36.8 C) 98 F (36.7 C)  SpO2: 97% 100%    Exam:  Constitutional:  . The patient is awake, alert, and oriented x 3. No acute distress. Respiratory:  . No increased work of breathing.  . No wheezes, rales, or rhonchi.  . No tactile fremitus. Cardiovascular:  . Regular rate and rhythm.  . No murmurs, ectopy, or gallups are appreciated. . No lateral PMI. No thrills. . No LE extremity edema   Abdomen:  . Abdomen is soft, Right sided tenderness, somewhat distended. . No hernias, masses or organomegaly are appreciated. [name] Hypoactive bowel sounds. Musculoskeletal:  . No cyanosis, clubbing, or edema Skin:  . No rashes, lesions, ulcers . palpation of skin: no induration or nodules Neurologic:  . Pt is unable to cooperate with exam. Psychiatric:  . Mental status - Depressed  I have personally reviewed the following:   Today's Data   CBC    Component Value Date/Time   WBC 3.4 (L) 02/23/2019 0336   RBC 2.71 (L) 02/23/2019 0336   HGB 8.5 (L) 02/23/2019 0336   HGB 8.7 (L) 01/25/2019 1050   HCT 27.0 (L) 02/23/2019 0336   HCT 24.0 (L) 02/08/2019 1635   PLT 121 (L) 02/23/2019 0336   PLT 139 (L) 01/25/2019 1050   MCV 99.6 02/23/2019 0336   MCH 31.4  02/23/2019 0336   MCHC 31.5 02/23/2019 0336   RDW 18.1 (H) 02/23/2019 0336   LYMPHSABS 0.4 (L) 02/23/2019 0336   MONOABS 0.4 02/23/2019 0336   EOSABS 0.0 02/23/2019 0336   BASOSABS 0.0 02/23/2019 0336   BMP Latest Ref Rng & Units 02/23/2019 02/22/2019 02/21/2019  Glucose 70 - 99 mg/dL 179(H) 169(H) 187(H)  BUN 8 - 23 mg/dL 20 20 21   Creatinine 0.61 - 1.24 mg/dL 0.36(L) 0.36(L) 0.35(L)  Sodium 135 -  145 mmol/L 136 136 134(L)  Potassium 3.5 - 5.1 mmol/L 4.0 4.0 4.2  Chloride 98 - 111 mmol/L 103 102 102  CO2 22 - 32 mmol/L 25 25 26   Calcium 8.9 - 10.3 mg/dL 8.3(L) 8.1(L) 8.1(L)    Micro Data  . Body fluid culture pending.  Imaging  . CT abdomen/pelvis  Scheduled Meds: . cholestyramine light  4 g Oral 3 times per day  . citalopram  10 mg Oral Daily  . dexamethasone  1 mg Oral Q breakfast  . feeding supplement  1 Container Oral Q24H  . feeding supplement (PRO-STAT SUGAR FREE 64)  30 mL Oral Daily  . insulin aspart  0-15 Units Subcutaneous 4 times per day  . nutrition supplement (JUVEN)  1 packet Oral BID BM  . oxandrolone  5 mg Oral BID  . Short-Chain Fatty Acids Soln (60 mL)  60 mL Per Tube BID  . zinc sulfate  220 mg Oral BID   Continuous Infusions: . sodium chloride 10 mL/hr at 02/23/19 1800  . piperacillin-tazobactam (ZOSYN)  IV 3.375 g (02/24/19 1339)  . 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)   Deep vein thrombosis (DVT) of femoral vein of right lower extremity (HCC)   Diabetes mellitus (HCC)   Bowel obstruction in setting of lymphoma and sigmoid stricture   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HCC)   Hyponatremia   Malnutrition of moderate degree   Dehydration   Palliative care by specialist   Goals of care, counseling/discussion   Symptomatic anemia   Atrophy of muscle of multiple sites   Depression   Abnormal CT scan, gastrointestinal tract   Thrombocytopenia (HCC)   Abscess of sigmoid colon   Loop ilesotomy for fecal diversion of sigmoid stricture   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)   LOS: 16 days    A & P   Abdominal abscesses: 4 cc of turbid fluid aspirated yesterday by IR who feels that the abscess is not ultimately  "drainable".Patient continues to complain of persistent abdominal discomfort.  CT abdomen/pelvis done showed Multifocal fluid collections along the course of the descending and sigmoid colon consistent with developing abscesses.Culture of aspirated fluid has grown out a few GNR. He is on zosyn. Negative  blood cultures.Patient is not septic. He is NPO and receiving TPN.  CT abdomen/pelvis as above.  Patient denied colectomy by surgery, althoug apparently today he stated that he would consider if it there were no other option.  For now he has also declined endoscopic procedures that are necessary to prepare for possible surgery. Plan to consult palliative care on Monday if the patient does not agree to surgery or further investigation through endoscopy.   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 Oncology following.  Currently in  remission  History of a small bowel obstruction/ileus: Status post diverting ileostomy on 08/25/2018. He is having stool output from the ileostomy bag.  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 GI panel, C. difficile negative. Elevated ESR and CRP.GI following. CMV DNA negative.  Continue cholestyramine. GI planning for short chain fatty acid enema but patient is reluctant .  Pancytopenia: Multifactorial. Hb this morning is 8.5.  He has been transfused with 3 units of  PRBCs so far.  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  Also has thrombocytopenia, although platelets are somewhat improved at 121 this morning.  Failure to thrive/dehydration/electrolyte abnormalities: Due to low oral intake, high ostomy output.  Started on TPN on 02/09/2019. Expected bowel dysfunction for weeks to months. Continue TPN.  Electrolytes being monitored and supplemented.  Diabetes type 2: Continue sliding scale insulin. Monitor CBGs.  History of right lower extremity DVT/bilateral PE: On  eliquis.  Depression: Continue Celexa  Debility/deconditioning: Plan is to discharge to skilled nursing facility with TPN when medically stable.He has a PICC line line on left UE  He has been accepted at [facility] skilled nursing facility.    Nutrition Problem: Increased nutrient needs Etiology: chronic illness, cancer and cancer related treatments  I have seen and examined this patient myself. I have spent 30 minutes in his evaluation and care.  DVT prophylaxis: Eliquis Code Status: DNR Family Communication: None available. Disposition Plan: Skilled nursing facility after medical stabilization  Mark Burdin, DO [facility] Direct contact: see [email or web address]  7PM-7AM contact night coverage as above

## 2019-02-24 NOTE — Plan of Care (Signed)
  Problem: Pain Managment: Goal: General experience of comfort will improve Outcome: Progressing  Pt asked for 0.5 mg Dilaudid this shift. It was effective. Problem: Activity: Goal: Risk for activity intolerance will decrease Outcome: Not Progressing Pt refuses to ambulate.   Problem: Skin Integrity: Goal: Risk for impaired skin integrity will decrease Outcome: Not Progressing  Pt refuses to be turned to protect skin integrity.

## 2019-02-25 LAB — BASIC METABOLIC PANEL
Anion gap: 6 (ref 5–15)
BUN: 19 mg/dL (ref 8–23)
CO2: 28 mmol/L (ref 22–32)
Calcium: 8.5 mg/dL — ABNORMAL LOW (ref 8.9–10.3)
Chloride: 103 mmol/L (ref 98–111)
Creatinine, Ser: 0.32 mg/dL — ABNORMAL LOW (ref 0.61–1.24)
GFR calc Af Amer: >60 mL/min (ref >60)
GFR calc non Af Amer: >60 mL/min (ref >60)
Glucose, Bld: 97 mg/dL (ref 70–99)
Potassium: 4.1 mmol/L (ref 3.5–5.1)
Sodium: 137 mmol/L (ref 135–145)

## 2019-02-25 LAB — GLUCOSE, CAPILLARY
Glucose-Capillary: 94 mg/dL (ref 70–99)
Glucose-Capillary: 86 mg/dL (ref 70–99)
Glucose-Capillary: 92 mg/dL (ref 70–99)
Glucose-Capillary: 214 mg/dL — ABNORMAL HIGH (ref 70–99)

## 2019-02-25 MED ORDER — TRAVASOL 10 % IV SOLN
INTRAVENOUS | Status: AC
  Administered 2019-02-25: 18:00:00 via INTRAVENOUS
  Filled 2019-02-25: qty 1254

## 2019-02-25 NOTE — Progress Notes (Signed)
PT Cancellation Note  Patient Details Name: Mark Frey MRN: [id number] DOB: June 15, 1956   Cancelled Treatment:    Reason Eval/Treat Not Completed: Patient declined, no reason specified. Pt opened his eyes briefly, but then closed them and declined all PT offerings.   Galen Manila 02/25/2019, 4:12 PM

## 2019-02-25 NOTE — Progress Notes (Signed)
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 Usual Weight: 190 lb - 15% weight loss in last month per RD  Current Nutrition: NPO, TPN  IVF: none  Central access: Port TPN start date: 5/29  ASSESSMENT                                                                                                          HPI: 63 yo male with burkitt's lymphoma s/p chemo now with severe radiation colitis, prolonged malnutrition, multiple recent admissions to start TPN per pharmacy management  Significant events:  6/3 repeat CT abd/pelvis> Descending and sigmoid colitis with fluid collections, not drainable by IR, Zosyn added, made NPO 6/9: apixaban d/c'd for plan for endoscopic eval in next few days. And possibly colonic resection in future per GI notes 6/11: CT guided aspiration of LLQ fluid per IR this AM 6/13: colonoscopy and ileoscopy postponed yesterday, to re-evaluate next week, possible palliative consult soon pending patient's decisions  Today, 02/25/19  Glucose - goal < 552 while on cyclic TPN (tolerate higher goal CBGs while on cyclic) - 0 Units CEY/22 hours;   CBGs  (? Why the drastic change - possibly due to d/c of Zosyn) during 12 hr cylcle of TPN w/ 40 units of insulin.  CBGs while TPN off 97, 80, 86 1 hr after TPN turned off this AM. Note hx DM and chronic dexamethasone 2 mg qday > decreased to 1 mg/day 6/2.  Electrolytes - WNL on 6/12  Renal - WNL on 6/12  LFTs - AST/ALT stable/WNL on 6/12  TGs - 71 (6/1), 98 (6/8)  Prealbumin - 7.7 (6/1), 16 (6/8) oxandrin added 5/29, dose increased 6/1  NUTRITIONAL GOALS                                                                                              RD recs: 2300-2500 kcal/day, 115-125g protein/day  Custom TPN at goal of 95 ml/hr over 33KPQ or 12 hr cyclic administration getting 2280 ml to provide: 125 g/day protein, 2270Kcal/day.  PLAN  At 1800 today:  Continue custom cyclic TPN with 0981 mls/day over 12 hours = full support  TPN to contain standard multivitamins daily and trace elements only on MWF due to national shortage.  Standard TPN concentrations of lytes: Na at 125 mEq/L, K at 45, Mag 8, XB:JYNW 1:1 and increased phos at 20  IVF: none  Continue modSSI/CBGs - custom schedule for cyclic TPN  CBGs much lower last 24hrs (? Could be due to d/c of Zosyn which is in dextrose) - will cut amount of insulin in TPN in half for next TPN bag - decrease from 40 to 20 units  TPN lab panels on Mondays & Thursdays.     Adrian Saran, PharmD, BCPS  02/25/2019 10:06 AM

## 2019-02-25 NOTE — Progress Notes (Signed)
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  On his baseline, is able to ambulate independently with a walker but more recently he was unable to stand on his own. Patient has been started on TPN for failure to thrive.  Oncology was following.  Patient was complaining of persistent abdominal discomfort.  CT abdomen/pelvis done here showed Multifocal fluid collections along the course of the descending and sigmoid colon  consistent with developing abscesses.Started on antibiotics.General surgery/IR following.  CT abdomen and pelvis was performed on 02/19/2019 that revealed persistent mucosal thickening and enhancement involving the entire colon consistent with pancolitis. There appeared to be a communicating fluid filled tract between the distal ileum to the sigmoid colon. General surgery offered operative solution via total colectomy with ileostomy. The patient declined at this point. He has also declined endoscopic procedures that would be necessary prior to surgery at this point. GI plans to try for endoscopy again next week.  IR was consulted and the patient underwent percutaneous drainage of abscess collection on 02/22/2019. 4 cc of fluid was obtained and was sent for culture. IR does not feel that the abscesses are "drainable".  Plan to recall palliative care next week as currently the patient is refusing to be turned, taking very little PO, and refusing to get up out of bed, unless the patient changes his approach to attempts to investigate and treat him.  I have discussed the patient with his wife. She would like to have a meeting set up with   The patient is receiving TPN as managed by  pharmacy.  Consultants  . General Surgery . Gastroenterology . Palliative Care  Procedures  . Percutaneous drainage of pericolonic abscess collection by IR on 02/22/2019  Antibiotics  . Zosyn  Subjective  The patient is resting in bed. He continues to complain of abdominal pain and bloating.   Objective   Vitals:  Vitals:   02/25/19 0557 02/25/19 1428  BP: 103/61 102/72  Pulse: 93 85  Resp: 18 14  Temp: 97.6 F (36.4 C) 98 F (36.7 C)  SpO2: 98% 98%    Exam:  Constitutional:  . The patient is awake, alert, and oriented x 3. No acute distress. Respiratory:  . No increased work of breathing.  . No wheezes, rales, or rhonchi.  . No tactile fremitus. Cardiovascular:  . Regular rate and rhythm.  . No murmurs, ectopy, or gallups are appreciated. . No lateral PMI. No thrills. . No LE extremity edema   Abdomen:  . Abdomen is soft, Right sided tenderness, somewhat distended. . No hernias, masses or organomegaly are appreciated. [name] Hypoactive bowel sounds. Musculoskeletal:  . No cyanosis, clubbing, or edema Skin:  . No rashes, lesions, ulcers . palpation of skin: no induration or nodules Neurologic:  . Pt is unable to cooperate with exam. Psychiatric:  . Mental status - Depressed  I have personally reviewed the following:   Today's Data   CBC    Component Value Date/Time   WBC 3.4 (L) 02/23/2019 0336   RBC 2.71 (L) 02/23/2019 0336   HGB 8.5 (L) 02/23/2019 0336   HGB 8.7 (L) 01/25/2019 1050   HCT 27.0 (L) 02/23/2019 0336   HCT 24.0 (L) 02/08/2019 1635   PLT 121 (  L) 02/23/2019 0336   PLT 139 (L) 01/25/2019 1050   MCV 99.6 02/23/2019 0336   MCH 31.4 02/23/2019 0336   MCHC 31.5 02/23/2019 0336   RDW 18.1 (H) 02/23/2019 0336   LYMPHSABS 0.4 (L) 02/23/2019 0336   MONOABS 0.4 02/23/2019 0336   EOSABS 0.0 02/23/2019 0336   BASOSABS 0.0 02/23/2019 0336   BMP Latest Ref Rng & Units 02/25/2019 02/23/2019 02/22/2019  Glucose 70 - 99 mg/dL 97 179(H) 169(H)  BUN  8 - 23 mg/dL _0 Creatinine 0.61 - 1.24 mg/dL 0.32(L) 0.36(L) 0.36(L)  Sodium 135 - 145 mmol/L 137 136 136  Potassium 3.5 - 5.1 mmol/L 4.1 4.0 4.0  Chloride 98 - 111 mmol/L 103 103 102  CO2 22 - 32 mmol/L _1 Calcium 8.9 - 10.3 mg/dL 8.5(L) 8.3(L) 8.1(L)    Micro Data  . Body fluid culture pending.  Imaging  . CT abdomen/pelvis  Scheduled Meds: . cholestyramine light  4 g Oral 3 times per day  . citalopram  10 mg Oral Daily  . dexamethasone  1 mg Oral Q breakfast  . feeding supplement  1 Container Oral Q24H  . feeding supplement (PRO-STAT SUGAR FREE 64)  30 mL Oral Daily  . insulin aspart  0-15 Units Subcutaneous 4 times per day  . nutrition supplement (JUVEN)  1 packet Oral BID BM  . oxandrolone  5 mg Oral BID  . Short-Chain Fatty Acids Soln (60 mL)  60 mL Per Tube BID  . zinc sulfate  220 mg Oral BID   Continuous Infusions: . sodium chloride 10 mL (02/24/19 2254)  . ceFEPime (MAXIPIME) IV 2 g (02/25/19 0522)  . 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)   Deep vein thrombosis (DVT) of femoral vein of right lower extremity (HCC)   Diabetes mellitus (HCC)   Bowel obstruction in setting of lymphoma and sigmoid stricture   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HCC)   Hyponatremia   Malnutrition of moderate degree   Dehydration   Palliative care by specialist   Goals of care, counseling/discussion   Symptomatic anemia   Atrophy of muscle of multiple sites   Depression   Abnormal CT scan, gastrointestinal tract   Thrombocytopenia (HCC)   Abscess of sigmoid colon   Loop ilesotomy for fecal diversion of sigmoid stricture   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  CT abdomen/pelvis done showed Multifocal fluid collections along the course of the descending and sigmoid colon consistent with developing abscesses.Culture of aspirated fluid has grown out a few GNR. He is on zosyn. Negative  blood cultures.Patient is not septic. He is NPO and receiving TPN. Plan is for meeting with patient, wife, and palliative care on Monday. I have made arrangements with nursing for the wife to be allowed on the floor. I have paged palliative care, but have not heard back from them yet. They have been involved with the patient in the past.  CT abdomen/pelvis as above.  Patient denied colectomy by surgery, althoug apparently today  he stated that he would consider if it there were no other option.  For now he has also declined endoscopic procedures that are necessary to prepare for possible surgery. Plan to consult palliative care on Monday if the patient does not agree to surgery or further investigation through endoscopy.   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 Oncology following.  Currently in remission  History of a small bowel obstruction/ileus: Status post diverting ileostomy on 08/25/2018. He is having stool output from the ileostomy bag.  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 GI panel, C. difficile negative. Elevated ESR and CRP.GI following. CMV DNA negative.  Continue cholestyramine. GI planning for short chain fatty acid enema but patient is reluctant .  Pancytopenia: Multifactorial. Hb this morning is 8.5.  He has been transfused with 3 units of  PRBCs so far.  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  Also has thrombocytopenia, although platelets are somewhat improved at 121 this morning.  Failure to  thrive/dehydration/electrolyte abnormalities: Due to low oral intake, high ostomy output.  Started on TPN on 02/09/2019. Expected bowel dysfunction for weeks to months. Continue TPN.  Electrolytes being monitored and supplemented.  Diabetes type 2: Continue sliding scale insulin. Monitor CBGs.  History of right lower extremity DVT/bilateral PE: On eliquis.  Depression: Continue Celexa  Debility/deconditioning: Plan is to discharge to skilled nursing facility with TPN when medically stable.He has a PICC line line on left UE  He has been accepted at [facility] skilled nursing facility.    Nutrition Problem: Increased nutrient needs Etiology: chronic illness, cancer and cancer related treatments  I have seen and examined this patient myself. I have spent 36 minutes in his evaluation and care.  DVT prophylaxis: Eliquis Code Status: DNR Family Communication: None available. Disposition Plan: Skilled nursing facility after medical stabilization  Kynzlee Hucker, DO [facility] Direct contact: see [email or web address]  7PM-7AM contact night coverage as above

## 2019-02-26 DIAGNOSIS — E119 Type 2 diabetes mellitus without complications: Secondary | ICD-10-CM

## 2019-02-26 DIAGNOSIS — B965 Pseudomonas (aeruginosa) (mallei) (pseudomallei) as the cause of diseases classified elsewhere: Secondary | ICD-10-CM

## 2019-02-26 DIAGNOSIS — B964 Proteus (mirabilis) (morganii) as the cause of diseases classified elsewhere: Secondary | ICD-10-CM

## 2019-02-26 LAB — DIFFERENTIAL
Abs Immature Granulocytes: 0.03 10*3/uL (ref 0.00–0.07)
Basophils Absolute: 0 10*3/uL (ref 0.0–0.1)
Basophils Relative: 1 %
Eosinophils Absolute: 0 10*3/uL (ref 0.0–0.5)
Eosinophils Relative: 1 %
Immature Granulocytes: 1 %
Lymphocytes Relative: 10 %
Lymphs Abs: 0.3 10*3/uL — ABNORMAL LOW (ref 0.7–4.0)
Monocytes Absolute: 0.5 10*3/uL (ref 0.1–1.0)
Monocytes Relative: 15 %
Neutro Abs: 2.5 10*3/uL (ref 1.7–7.7)
Neutrophils Relative %: 72 %

## 2019-02-26 LAB — COMPREHENSIVE METABOLIC PANEL
ALT: 26 U/L (ref 0–44)
AST: 13 U/L — ABNORMAL LOW (ref 15–41)
Albumin: 2.1 g/dL — ABNORMAL LOW (ref 3.5–5.0)
Alkaline Phosphatase: 65 U/L (ref 38–126)
Anion gap: 8 (ref 5–15)
BUN: 21 mg/dL (ref 8–23)
CO2: 25 mmol/L (ref 22–32)
Calcium: 8.3 mg/dL — ABNORMAL LOW (ref 8.9–10.3)
Chloride: 103 mmol/L (ref 98–111)
Creatinine, Ser: 0.31 mg/dL — ABNORMAL LOW (ref 0.61–1.24)
GFR calc Af Amer: >60 mL/min (ref >60)
GFR calc non Af Amer: >60 mL/min (ref >60)
Glucose, Bld: 165 mg/dL — ABNORMAL HIGH (ref 70–99)
Potassium: 4.2 mmol/L (ref 3.5–5.1)
Sodium: 136 mmol/L (ref 135–145)
Total Bilirubin: 0.2 mg/dL — ABNORMAL LOW (ref 0.3–1.2)
Total Protein: 5.2 g/dL — ABNORMAL LOW (ref 6.5–8.1)

## 2019-02-26 LAB — CBC
HCT: 25.2 % — ABNORMAL LOW (ref 39.0–52.0)
Hemoglobin: 7.6 g/dL — ABNORMAL LOW (ref 13.0–17.0)
MCH: 30.5 pg (ref 26.0–34.0)
MCHC: 30.2 g/dL (ref 30.0–36.0)
MCV: 101.2 fL — ABNORMAL HIGH (ref 80.0–100.0)
Platelets: 123 10*3/uL — ABNORMAL LOW (ref 150–400)
RBC: 2.49 MIL/uL — ABNORMAL LOW (ref 4.22–5.81)
RDW: 18.3 % — ABNORMAL HIGH (ref 11.5–15.5)
WBC: 3.5 10*3/uL — ABNORMAL LOW (ref 4.0–10.5)
nRBC: 0 % (ref 0.0–0.2)

## 2019-02-26 LAB — PREALBUMIN: Prealbumin: 21.7 mg/dL (ref 18–38)

## 2019-02-26 LAB — PHOSPHORUS: Phosphorus: 3.2 mg/dL (ref 2.5–4.6)

## 2019-02-26 LAB — GLUCOSE, CAPILLARY
Glucose-Capillary: 163 mg/dL — ABNORMAL HIGH (ref 70–99)
Glucose-Capillary: 87 mg/dL (ref 70–99)
Glucose-Capillary: 118 mg/dL — ABNORMAL HIGH (ref 70–99)
Glucose-Capillary: 173 mg/dL — ABNORMAL HIGH (ref 70–99)

## 2019-02-26 LAB — MAGNESIUM: Magnesium: 1.7 mg/dL (ref 1.7–2.4)

## 2019-02-26 LAB — TRIGLYCERIDES: Triglycerides: 66 mg/dL (ref <150)

## 2019-02-26 MED ORDER — TRAVASOL 10 % IV SOLN
INTRAVENOUS | Status: AC
  Administered 2019-02-26: 18:00:00 via INTRAVENOUS
  Filled 2019-02-26: qty 1254

## 2019-02-26 MED ORDER — ADULT MULTIVITAMIN W/MINERALS CH
1.0000 | ORAL_TABLET | Freq: Every day | ORAL | Status: DC
  Administered 2019-02-26 – 2019-03-04 (×6): 1 via ORAL
  Filled 2019-02-26 (×8): qty 1

## 2019-02-26 NOTE — Progress Notes (Signed)
PMT no charge note  PMT has been peripherally following, in initial [facility] discussions with patient and wife, patient had endorsed continuation of TPN, was considering SNF.   Received call from RN, chart reviewed. Most acute issue at this point appears to be descending and sigmoid colitis with fluid collections, likely not amenable to drainage.    A family meeting scheduled for 02-27-2019 at 37 with wife for overall goals of care discussions with patient and wife.   Further recommendations to follow.   Continue current mode of care.   Loistine Chance MD [place] palliative medicine team [phone].

## 2019-02-26 NOTE — Progress Notes (Signed)
Patient ID: Mark Frey, male   DOB: 03-11-1956, 63 y.o.   MRN: [id number]  [facility] Progress Note     Subjective: CC-  Persistent pain, no better or worse. Denies n/v. Ostomy functioning.    Objective: Vital signs in last 24 hours: Temp:  [98 F (36.7 C)-98.9 F (37.2 C)] 98.9 F (37.2 C) (06/15 0535) Pulse Rate:  [85-93] 93 (06/15 0535) Resp:  [14-18] 18 (06/14 2131) BP: (102-113)/(64-72) 111/67 (06/15 0535) SpO2:  [98 %-100 %] 99 % (06/15 0535) Last BM Date: 02/26/19  Intake/Output from previous day: 06/14 0701 - 06/15 0700 In: 3092.2 [P.O.:290; I.V.:2502.2; IV Piggyback:300] Out: 1875 [Urine:1125; Stool:750] Intake/Output this shift: Total I/O In: 10 [I.V.:10] Out: -   PE: Gen: Alert, NAD, cooperative HEENT: EOM's intact, pupils equal and round Pulm: effort normal Abd: Soft,nondistended, mild LLQ TTP without peritonitis,ostomy viable with air and green/bright red blood-tinged liquid stool in bag Skin: warm and dry    Lab Results:  Recent Labs    02/26/19 0311  WBC 3.5*  HGB 7.6*  HCT 25.2*  PLT 123*   BMET Recent Labs    02/25/19 0330 02/26/19 0311  NA 137 136  K 4.1 4.2  CL 103 103  CO2 28 25  GLUCOSE 97 165*  BUN 19 21  CREATININE 0.32* 0.31*  CALCIUM 8.5* 8.3*   PT/INR No results for input(s): LABPROT, INR in the last 72 hours. CMP     Component Value Date/Time   NA 136 02/26/2019 0311   K 4.2 02/26/2019 0311   CL 103 02/26/2019 0311   CO2 25 02/26/2019 0311   GLUCOSE 165 (H) 02/26/2019 0311   BUN 21 02/26/2019 0311   CREATININE 0.31 (L) 02/26/2019 0311   CREATININE 0.78 01/25/2019 1050   CREATININE 1.26 12/01/2012 1700   CALCIUM 8.3 (L) 02/26/2019 0311   PROT 5.2 (L) 02/26/2019 0311   ALBUMIN 2.1 (L) 02/26/2019 0311   AST 13 (L) 02/26/2019 0311   AST 18 01/25/2019 1050   ALT 26 02/26/2019 0311   ALT 59 (H) 01/25/2019 1050   ALKPHOS 65 02/26/2019 0311   BILITOT 0.2 (L) 02/26/2019 0311   BILITOT 0.6  01/25/2019 1050   GFRNONAA >60 02/26/2019 0311   GFRNONAA >60 01/25/2019 1050   GFRAA >60 02/26/2019 0311   GFRAA >60 01/25/2019 1050   Lipase     Component Value Date/Time   LIPASE 19 08/17/2018 1738       Studies/Results: No results found.  Anti-infectives: Anti-infectives (From admission, onward)   Start     Dose/Rate Route Frequency Ordered Stop   02/24/19 2200  ceFEPIme (MAXIPIME) 2 g in sodium chloride 0.9 % 100 mL IVPB     2 g 200 mL/hr over 30 Minutes Intravenous Every 8 hours 02/24/19 1639     02/14/19 1400  piperacillin-tazobactam (ZOSYN) IVPB 3.375 g  Status:  Discontinued     3.375 g 12.5 mL/hr over 240 Minutes Intravenous Every 8 hours 02/14/19 1342 02/24/19 1639       Assessment/Plan Hx PE/DVT - on Eliquis (last dose 6/9) Dehydration PCM - prealbumin 6.5>>7.7>>16- on TPN Palliative care - DNR Pancytopenia  FTT/ severe deconditioning  - dehydration/electrolyte abnormalities  Type II diabetes Depression   Descending and sigmoid colitis with fluid collections - IRdoesn't feel it is drainablebased on CT scan 6/3 - GI panel and c diff neg for infectious origin - Dr. 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  Pt would be high risk for surgery.     LOS: 18  days    Rosario Adie, MD  [facility]

## 2019-02-26 NOTE — Progress Notes (Signed)
PROGRESS NOTE    Mark Frey  YJE:[id number] DOB: September 20, 1955 DOA: 02/07/2019 PCP: [name], MD    Brief Narrative:  Male who presented with weakness.  He does have significant past medical history of Burkitt's lymphoma status post radiation and chemotherapy, type II diabetes mellitus, PE/DVT and radiation colitis.  Patient developed worsening weakness for several weeks, the point where he was not able to stand by his own.  On his initial physical examination his temperature was 99, heart rate 110, respiratory rate 18, blood pressure 104/74, oxygen saturation 100%.  His lungs are clear to auscultation bilaterally, heart S1-S2 present rhythm, abdomen was soft, nontender, no lower extremity edema, patient had weakness on the left lower extremity compared to the right.  His affect was flat.   Patient was admitted to the hospital working diagnosis of dehydration complicated by anion gap metabolic acidosis and metabolic encephalopathy.  Patient continued to deteriorate, he was placed on TPN for nutrition.  CT of the abdomen and pelvis showed multiple fluid collections along the course of the descending sigmoid colon consistent with developing abscess.  Patient was placed on IV antibiotic therapy and surgery was consulted.  June 11 patient underwent percutaneous drainage of abscess collection.  Has declined their laparotomy with possible colectomy and ileostomy.   He has been referred to palliative care services.  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)   Deep vein thrombosis (DVT) of femoral vein of right lower extremity (HCC)   Diabetes mellitus (HCC)   Bowel obstruction in setting of lymphoma and sigmoid stricture   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HCC)   Hyponatremia   Malnutrition of  moderate degree   Dehydration   Palliative care by specialist   Goals of care, counseling/discussion   Symptomatic anemia   Atrophy of muscle of multiple sites   Depression   Abnormal CT scan, gastrointestinal tract   Thrombocytopenia (HCC)   Abscess of sigmoid colon   Loop ilesotomy for fecal diversion of sigmoid stricture   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  Multiple abdominal abscesses/ pseudomonas infection/ present on admissio. Patient with persistent right lower abdominal pain, his wbc is 3,5, culture positive for pseudomonas. Patient has declined surgery, will continue IV Cefepime. Patient with very poor prognosis and high risk for deterioration. Pain control with hydromorphone.   2. High grade B cell lymphoma. Poor prognosis. Complicated with pancytopenia. Continue decadron.   3. T2DM. Continue glucose cover and monitoring with insulin sliding scale, patient with very poor oral intake.   4. Calorie protein malnutrition. Continue TPN for nutrition. Poor oral intake.   5. Right lower extremity dvt. Continue anticoagulation with apixaban.     DVT prophylaxis: apixaban   Code Status: dnr  Family Communication: no family at the bedside  Disposition Plan/ discharge barriers: pending palliative care consultations. Possible hospice.   Body mass index is 22.46 kg/m. Malnutrition Type:  Nutrition Problem: Increased nutrient needs Etiology: chronic illness, cancer and cancer related treatments   Malnutrition Characteristics:  Signs/Symptoms: estimated needs   Nutrition Interventions:  Interventions: Boost Breeze, TPN, Juven, Prostat  RN Pressure Injury Documentation:    Consultants:   Palliative Care  Surgery   Procedures:     Antimicrobials:   Cefepime.     Subjective:  Patient continue to have right lower quadrant pain, improved with analgesics, no associated nausea or vomiting, chest pain or dyspnea. Continue to be  very weak and deconditioned.   Objective: Vitals:   02/25/19 1428 02/25/19 2131 02/26/19 0535 02/26/19 1316  BP: 102/72 113/64 111/67 106/73  Pulse: 85 88 93 84  Resp: 14 18  18   Temp: 98 F (36.7 C) 98.9 F (37.2 C) 98.9 F (37.2 C) 98.4 F (36.9 C)  TempSrc: Oral Oral Oral Oral  SpO2: 98% 100% 99% 97%  Weight:      Height:        Intake/Output Summary (Last 24 hours) at 02/26/2019 1412 Last data filed at 02/26/2019 1330 Gross per 24 hour  Intake 3102.22 ml  Output 1900 ml  Net 1202.22 ml   Filed Weights   02/08/19 1726 02/22/19 1321  Weight: 69 kg 69 kg    Examination:   General: deconditioned and ill looking appearing  Neurology: Awake and alert, non focal  E ENT: milf pallor, no icterus, oral mucosa moist Cardiovascular: No JVD. S1-S2 present, rhythmic, no gallops, rubs, or murmurs. No lower extremity edema. Pulmonary: positive breath sounds bilaterally, decreased air movement, no wheezing, or rhonchi, but scattered rales. Gastrointestinal. Abdomen with no organomegaly, non tender, no rebound or guarding/ colostomy bag in place.  Skin. No rashes Musculoskeletal: no joint deformities     Data Reviewed: I have personally reviewed following labs and imaging studies  CBC: Recent Labs  Lab 02/21/19 0504 02/22/19 0440 02/23/19 0336 02/26/19 0311  WBC 3.7* 3.5* 3.4* 3.5*  NEUTROABS 2.7 2.6 2.4 2.5  HGB 8.2* 8.2* 8.5* 7.6*  HCT 26.8* 26.8* 27.0* 25.2*  MCV 99.6 100.0 99.6 101.2*  PLT 110* 109* 121* 096*   Basic Metabolic Panel: Recent Labs  Lab 02/20/19 0425 02/21/19 0504 02/22/19 0440 02/23/19 0336 02/25/19 0330 02/26/19 0311  NA 132* 134* 136 136 137 136  K 4.0 4.2 4.0 4.0 4.1 4.2  CL 101 102 102 103 103 103  CO2 22 26 25 25 28 25   GLUCOSE 181* 187* 169* 179* 97 165*  BUN 19 21 20 20 19 21   CREATININE 0.36* 0.35* 0.36* 0.36* 0.32* 0.31*  CALCIUM 8.1* 8.1* 8.1* 8.3* 8.5* 8.3*  MG 1.8 1.8 1.9 1.9  --  1.7  PHOS 2.6 2.6 2.4* 3.0  --  3.2    GFR: Estimated Creatinine Clearance: 93.4 mL/min (A) (by C-G formula based on SCr of 0.31 mg/dL (L)). Liver Function Tests: Recent Labs  Lab 02/22/19 0440 02/26/19 0311  AST 19 13*  ALT 36 26  ALKPHOS 82 65  BILITOT 0.1* 0.2*  PROT 5.1* 5.2*  ALBUMIN 2.0* 2.1*   No results for input(s): LIPASE, AMYLASE in the last 168 hours. No results for input(s): AMMONIA in the last 168 hours. Coagulation Profile: Recent Labs  Lab 02/22/19 0440  INR 1.0   Cardiac Enzymes: No results for input(s): CKTOTAL, CKMB, CKMBINDEX, TROPONINI in the last 168 hours. BNP (last 3 results) No results for input(s): PROBNP in the last 8760 hours. HbA1C: No results for input(s): HGBA1C in the last 72 hours. CBG: Recent Labs  Lab 02/25/19 1443 02/25/19 2014 02/26/19 0232 02/26/19 0656 02/26/19 1315  GLUCAP 92 214* 163* 87 118*   Lipid Profile: Recent Labs    02/26/19 0311  TRIG 66   Thyroid Function Tests: No results for input(s): TSH, T4TOTAL, FREET4, T3FREE, THYROIDAB in the last 72 hours. Anemia Panel: No results for input(s): VITAMINB12, FOLATE, FERRITIN, TIBC, IRON,  RETICCTPCT in the last 72 hours.    Radiology Studies: I have reviewed all of the imaging during this hospital visit personally     Scheduled Meds: . cholestyramine light  4 g Oral 3 times per day  . citalopram  10 mg Oral Daily  . dexamethasone  1 mg Oral Q breakfast  . feeding supplement  1 Container Oral Q24H  . feeding supplement (PRO-STAT SUGAR FREE 64)  30 mL Oral Daily  . insulin aspart  0-15 Units Subcutaneous 4 times per day  . multivitamin with minerals  1 tablet Oral Daily  . nutrition supplement (JUVEN)  1 packet Oral BID BM  . oxandrolone  5 mg Oral BID  . Short-Chain Fatty Acids Soln (60 mL)  60 mL Per Tube BID  . zinc sulfate  220 mg Oral BID   Continuous Infusions: . sodium chloride 10 mL (02/24/19 2254)  . ceFEPime (MAXIPIME) IV 2 g (02/26/19 1351)  . TPN CYCLIC-ADULT (ION)       LOS: 18  days        Mauricio Gerome Apley, MD

## 2019-02-26 NOTE — Progress Notes (Signed)
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 Usual Weight: 190 lb - 15% weight loss in last month per RD  Current Nutrition: Full liquid diet ordered- pt refusing, TPN  IVF: none  Central access: [place] TPN start date: 5/29  ASSESSMENT                                                                                                          HPI: 63 yo male with burkitt's lymphoma s/p chemo now with severe radiation colitis, prolonged malnutrition, multiple recent admissions to start TPN per pharmacy management  Significant events:  6/3 repeat CT abd/pelvis> Descending and sigmoid colitis with fluid collections, not drainable by IR, Zosyn added, made NPO 6/9: apixaban d/c'd for plan for endoscopic eval in next few days. And possibly colonic resection in future per GI notes 6/11: CT guided aspiration of LLQ fluid per IR this AM 6/13: colonoscopy and ileoscopy postponed yesterday, to re-evaluate next week, 6/14: palliative care consulted  Today, 02/26/19  Glucose - goal < 978 while on cyclic TPN (tolerate higher goal CBGs while on cyclic) - 8 Units ERQ/41 hours;   CBGs now mildly increased with reduced insulin in TPN (214, 165).  CBG 1h after TPN off at goal (87).  Note hx DM and chronic dexamethasone 1 mg/day.  Electrolytes - WNL on 6/15  Renal - WNL on 6/15  LFTs - AST/ALT stable/WNL on 6/15  TGs - 71 (6/1), 98 (6/8), 66(6/15)  Prealbumin - 7.7 (6/1), 16 (6/8) oxandrin added 5/29, dose increased 6/1. 21.7 (6/15)  NUTRITIONAL GOALS                                                                                             RD recs: 2300-2500 kcal/day,  115-125g protein/day  Custom TPN at goal of 95 ml/hr over 28SKS or 12 hr cyclic administration getting 2280 ml to provide: 125 g/day protein, 2270Kcal/day.  PLAN  At 1800 today:  Continue custom cyclic TPN with 4888 mls/day over 12 hours = full support  Change to oral multivitamin/mineral due to national shortage.  Standard TPN concentrations of lytes: Na at 125 mEq/L, K at 45, Mag 8, BV:QXIH 1:1 and increased phos at 20  IVF: none  Continue modSSI/CBGs - custom schedule for cyclic TPN  CBGs now slightly elevated with reduced insulin- increase insulin in TPN to 25 units  TPN lab panels on Mondays & Thursdays.   Netta Cedars, PharmD, BCPS 02/26/2019 7:22 AM

## 2019-02-26 NOTE — Progress Notes (Addendum)
Progress Note    ASSESSMENT AND PLAN:    19. 63 yo male with Burkitt's lymphoma (currently in remission), initially responded to chemo/ radiation but then developed bowel obstruction secondary to sigmoid stricture for which he underwent loop ileostomy. Shortly afterwards he developed acute colitis which became chronic and  ? Possibly secondary to radiation vr vrs diversion colitis. Receiving short-chain fatty acids through ileostomy.  This admission he was found to have new intraabdominal fluid collections.  Our recommendation was to do a colonoscopy / ileoscopy and possibly colectomy at some point but patient would like to hold off on both for now. Surgery is following.  He is s/p aspiration of LLQ fluid collection by IR on 02/22/19. Culture >>> PSEUDOMONAS AERUGINOSA. Appears fungus culture is still pending. -Refusing Questran for high ostomy output. He feels ostomy is at baseline without it.  -Refusing fatty acids.  -Zosyn recently changed to Maxipime -He still endorses lower abdominal pain but not any worse than normal. I haven't seen him seen week before last but he looks much, much better now. More talkative, animated today.   2. Failure to thrive / malnutrition / debility.  Getting TNA. Plan is for eventual discharge to SNF -He enjoys and is tolerating Ginger-ale, sherbet and pudding  3. Pancytopenia, multifactorial. Improving.   4. Hx of DVT / PE , on Eliquis    Attending Physician Note   I have taken an interval history, reviewed the chart and examined the patient. I agree with the Advanced Practitioner's note, impression and recommendations.   Burkitt's lymphoma received chemoradiation, developed bowel obstruction due to sigmoid stricture treated with loop ileostomy. Now has acute colitis presumed radiation or diversion. He is declining short chain fatty acids, questran, colonoscopy / ileoscopy. Palliative care is reconsulting to help determine GOC.   Lucio Edward, MD [facility]    SUBJECTIVE   No new complaints. Says abdominal pain is "about the same".    OBJECTIVE:     Vital signs in last 24 hours: Temp:  [98 F (36.7 C)-98.9 F (37.2 C)] 98.9 F (37.2 C) (06/15 0535) Pulse Rate:  [85-93] 93 (06/15 0535) Resp:  [14-18] 18 (06/14 2131) BP: (102-113)/(64-72) 111/67 (06/15 0535) SpO2:  [98 %-100 %] 99 % (06/15 0535) Last BM Date: 02/25/19 General:   Alert, well-developed male in NAD EENT:  Normal hearing, non icteric sclera, conjunctive pink.  Heart:  Regular rate and rhythm;   No lower extremity edema   Pulm: Normal respiratory effort, lungs CTA bilaterally without wheezes or crackles. Abdomen:  Soft, nondistended, nontender.  Normal bowel sounds. Greenish brown liquid in ostomy bag     Neurologic:  Alert and  oriented x4;  grossly normal neurologically. Psych:  Pleasant, cooperative.  Normal mood and affect.   Intake/Output from previous day: 06/14 0701 - 06/15 0700 In: 3092.2 [P.O.:290; I.V.:2502.2; IV Piggyback:300] Out: 1875 [Urine:1125; Stool:750] Intake/Output this shift: No intake/output data recorded.  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)   Deep vein thrombosis (DVT) of femoral vein of right lower extremity (HCC)   Diabetes mellitus (HCC)   Bowel obstruction in setting of lymphoma and sigmoid stricture   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HCC)   Hyponatremia   Malnutrition of  moderate degree   Dehydration   Palliative care by specialist   Goals of care, counseling/discussion   Symptomatic anemia   Atrophy of muscle of multiple sites   Depression   Abnormal CT scan, gastrointestinal tract   Thrombocytopenia (HCC)   Abscess of sigmoid colon   Loop ilesotomy for fecal diversion of sigmoid stricture   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)     LOS: 18 days   Tye Savoy ,NP 02/26/2019, 8:47 AM

## 2019-02-26 NOTE — Progress Notes (Addendum)
HEMATOLOGY-ONCOLOGY PROGRESS NOTE  SUBJECTIVE: Continues to have ongoing left lower quadrant abdominal pain.  He still has liquid stool through his ostomy but states this is at his baseline.  Chart has been reviewed and noted that he has been refusing care and some of his medications.  The patient will be meeting with the palliative care team later today to discuss goals of care.  He has no other complaints this morning.  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)  03/13/2018 Initial Biopsy   Peritoneal mass biopsy showed high-grade B-cell lymphoma.  Based on the IHC studies, the differential diagnosis includes diffuse large B-cell lymphoma, Burkitt lymphoma as well as but can like lymphoma.    03/15/2018 Initial Diagnosis   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[place])   03/15/2018 Procedure   Colonoscopy showed a large tumor in the proximal ascending colon, likely from the direct invasion of his abdominal lymphoma.  Biopsy confirmed high-grade B-cell lymphoma.   03/17/2018 -  Chemotherapy   R-EPOCH every 3 weeks   03/17/2018 Procedure   Bone marrow biopsy was negative for lymphoma involvement.   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 He received intrathecal methotrexate.   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[place])  04/10/2018 Initial Diagnosis   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[place])   04/16/2018 -  Chemotherapy   The patient had riTUXimab (RITUXAN) 900 mg in sodium chloride 0.9 % 250 mL  (2.6471 mg/mL) infusion, 375 mg/m2 = 900 mg, Intravenous,  Once, 1 of 1 cycle riTUXimab (RITUXAN) 900 mg in sodium chloride 0.9 % 160 mL infusion, 375 mg/m2 = 900 mg, Intravenous,  Once, 5 of 6 cycles Administration: 900 mg (04/17/2018), 900 mg (05/08/2018), 900 mg (05/29/2018), 900 mg (07/10/2018), 900 mg (06/19/2018)  for chemotherapy treatment.       REVIEW OF SYSTEMS:   A comprehensive 14 point review of systems was negative except as noted in the HPI.  I have reviewed the past medical history, past surgical history, social history and family history with the patient and they are unchanged from previous note.   PHYSICAL EXAMINATION:  Vitals:   02/25/19 2131 02/26/19 0535  BP: 113/64 111/67  Pulse: 88 93  Resp: 18   Temp: 98.9 F (37.2 C) 98.9 F (37.2 C)  SpO2: 100% 99%   Filed Weights   02/08/19 1726 02/22/19 1321  Weight: 152 lb 1.9 oz (69 kg) 152 lb 1.9 oz (69 kg)    Intake/Output from previous day: 06/14 0701 - 06/15 0700 In: 3092.2 [P.O.:290; I.V.:2502.2; IV Piggyback:300] Out: 1875 [Urine:1125; Stool:750]  GENERAL:alert, no distress and comfortable SKIN: No rashes or significant lesions. EYES: normal, Conjunctiva are pink and non-injected, sclera clear OROPHARYNX:no exudate, no erythema and lips, buccal mucosa, and tongue normal  NECK: supple, thyroid normal size, non-tender, without nodularity LYMPH:  no palpable lymphadenopathy in the cervical, axillary or inguinal LUNGS: clear to auscultation and percussion with normal breathing effort HEART: regular rate & rhythm and no murmurs and no  lower extremity edema. ABDOMEN: Soft, mild tenderness to the LLQ with light palpation.  Liquid stool noted in the ostomy.  Positive bowel sounds. Musculoskeletal:no cyanosis of digits and no clubbing  NEURO: alert & oriented x 3,no focal motor/sensory deficits.  Flat affect.  LABORATORY DATA:  I have reviewed the data as listed CMP Latest Ref Rng & Units 02/26/2019 02/25/2019  02/23/2019  Glucose 70 - 99 mg/dL 165(H) 97 179(H)  BUN 8 - 23 mg/dL 21 19 20   Creatinine 0.61 - 1.24 mg/dL 0.31(L) 0.32(L) 0.36(L)  Sodium 135 - 145 mmol/L 136 137 136  Potassium 3.5 - 5.1 mmol/L 4.2 4.1 4.0  Chloride 98 - 111 mmol/L 103 103 103  CO2 22 - 32 mmol/L 25 28 25   Calcium 8.9 - 10.3 mg/dL 8.3(L) 8.5(L) 8.3(L)  Total Protein 6.5 - 8.1 g/dL 5.2(L) - -  Total Bilirubin 0.3 - 1.2 mg/dL 0.2(L) - -  Alkaline Phos 38 - 126 U/L 65 - -  AST 15 - 41 U/L 13(L) - -  ALT 0 - 44 U/L 26 - -    Lab Results  Component Value Date   WBC 3.5 (L) 02/26/2019   HGB 7.6 (L) 02/26/2019   HCT 25.2 (L) 02/26/2019   MCV 101.2 (H) 02/26/2019   PLT 123 (L) 02/26/2019   NEUTROABS 2.5 02/26/2019    Ct Abdomen Pelvis W Contrast  Result Date: 02/19/2019 CLINICAL DATA:  Burkitt's lymphoma. Failure to thrive. Fluid collections along the colon. Post bowel resection EXAM: CT ABDOMEN AND PELVIS WITH CONTRAST TECHNIQUE: Multidetector CT imaging of the abdomen and pelvis was performed using the standard protocol following bolus administration of intravenous contrast. CONTRAST:  157m OMNIPAQUE IOHEXOL 300 MG/ML  SOLN COMPARISON:  CT 02/14/2019 FINDINGS: Lower chest: No pneumonia at the lung bases. Hepatobiliary: No focal hepatic lesion. No hepatic abscess. Gallbladder normal. Pancreas: Pancreas is normal. No ductal dilatation. No pancreatic inflammation. Spleen: Normal spleen Adrenals/urinary tract: Adrenal glands normal. Small hypodense lesions in the kidney likely represent benign cysts no evidence of pyelonephritis. No hydronephrosis bladder normal Stomach/Bowel: Stomach, duodenum normal. Loop ileostomy in the mid RIGHT abdomen. Towards the terminal ileum the last 6 cm of small bowel demonstrate mucosal enhancement and fluid within lumen. The ascending colon demonstrates thickened nodular mucosa. There is no fluid collection surrounding the ascending colon. The transverse colon is mildly thick-walled with  enhancement. The descending colon does demonstrate a pericolonic fluid collection beneath the serosal surface. There is adherence of the descending colon to the lateral abdominal wall in the mid descending colon (image 41/2). This is contiguous with the fluid collection surrounding the descending colon and proximal sigmoid colon which may communicate with the distal ileum at several locations (image 62/5 and image 89/5. Rectum normal. The fluid collection extending along the descending colon is similar to comparison exam. The fluid collection at the junction of the sigmoid colon and small bowel is decreased in volume. There is no clear evidence of extraluminal abscess. No evidence of perforation. Vascular/Lymphatic: Abdominal aorta is normal caliber. No periportal or retroperitoneal adenopathy. No pelvic adenopathy. Reproductive: Prostate normal Other: No free fluid. Musculoskeletal: No aggressive osseous lesion. IMPRESSION: 1. Persistent mucosal thickening and enhancement involving the entire colon consistent with pancolitis. 2. Fluid within the bowel wall of the descending colon appears to communicate with the distal ileum ileum at the level the sigmoid colon. The fluid collection is less prominent at the level of the sigmoid colon compared to exam 5 days prior. 3. Serosal surface of  the descending colon is adherent to the lateral abdominal wall peritoneal surface. 4. No intraperitoneal free air.  No extraluminal abscess. Electronically Signed   By: Suzy Bouchard M.D.   On: 02/19/2019 15:56   Ct Abdomen Pelvis W Contrast  Result Date: 02/14/2019 CLINICAL DATA:  Left-sided abdominal pain for several days EXAM: CT ABDOMEN AND PELVIS WITH CONTRAST TECHNIQUE: Multidetector CT imaging of the abdomen and pelvis was performed using the standard protocol following bolus administration of intravenous contrast. CONTRAST:  11m OMNIPAQUE 300 COMPARISON:  01/01/2019 FINDINGS: Lower chest: Mild basilar atelectasis is  noted. Hepatobiliary: Stable in appearance from the recent exam. Pancreas: Unremarkable. No pancreatic ductal dilatation or surrounding inflammatory changes. Spleen: Normal in size without focal abnormality. Adrenals/Urinary Tract: Adrenal glands are within normal limits. Scattered small cysts are noted bilaterally. A nonobstructing right renal stone is seen. The ureters are within normal limits. The bladder is partially compressed. Stomach/Bowel: The colon again demonstrates diffuse wall thickening consistent with colitis. There is a small fluid collection identified adjacent to the mid descending colon which measures approximately 2 x 1.5 cm in greatest dimension. A slightly larger fluid collection is noted surrounding sigmoid colon and extending towards the cecum in the right hemipelvis. This lies in an area of previously seen air-filled pockets likely representing a confluent abscess. This measures at least 8 cm in greatest transverse dimension but measures only approximately 2.6 cm in AP dimension. More proximal small bowel is within normal limits. Loop ileostomy is noted in the right mid abdomen. Vascular/Lymphatic: Aortic atherosclerosis. No enlarged abdominal or pelvic lymph nodes. Reproductive: Prostate is unremarkable. Other: No abdominal wall hernia or abnormality. No abdominopelvic ascites. Musculoskeletal: No acute or significant osseous findings. IMPRESSION: Multifocal fluid collections along the course of the descending and sigmoid colon as described consistent with developing abscesses. The largest of these lies along the margin of the sigmoid colon as described above. These changes are new from prior exam. Nonobstructing right renal stone stable from the prior exam. These results will be called to the ordering clinician or representative by the Radiologist Assistant, and communication documented in the PACS or zVision Dashboard. Electronically Signed   By: MInez CatalinaM.D.   On: 02/14/2019 12:48    Ct Aspiration  Result Date: 02/22/2019 INDICATION: Pancolitis and history of high-grade lymphoma. Slowly improving pericolonic fluid collections and request for aspiration of one of the collections for diagnostic purposes as an infectious source has not been able to be isolated by other means. EXAM: CT-GUIDED ASPIRATION OF LEFT LOWER QUADRANT PERITONEAL FLUID COLLECTION MEDICATIONS: The patient is currently admitted to the hospital and receiving intravenous antibiotics. The antibiotics were administered within an appropriate time frame prior to the initiation of the procedure. ANESTHESIA/SEDATION: Fentanyl 100 mcg IV; Versed 2.0 mg IV Moderate Sedation Time:  15 minutes. The patient was continuously monitored during the procedure by the interventional radiology nurse under my direct supervision. COMPLICATIONS: None immediate. PROCEDURE: Informed written consent was obtained from the patient after a thorough discussion of the procedural risks, benefits and alternatives. All questions were addressed. Maximal Sterile Barrier Technique was utilized including caps, mask, sterile gowns, sterile gloves, sterile drape, hand hygiene and skin antiseptic. A timeout was performed prior to the initiation of the procedure. CT was performed through the abdomen and pelvis in a supine position. After choosing a site for aspiration, the left lower abdominal wall was prepped with chlorhexidine and local anesthesia was provided with 1% lidocaine. An 18 gauge trocar needle was advanced under CT  guidance to the level of a pericolonic fluid collection anterior and lateral to the proximal sigmoid colon. Aspiration was performed. A fluid sample was obtained and sent for culture analysis including a aerobic/anaerobic culture and fungal culture. FINDINGS: The tiny collection lateral to the descending colon appears smaller and was felt to be too small to obtain any diagnostic fluid from. There is some persistent fluid anterior and  lateral to the proximal sigmoid colon that was targeted. There was return of 4 mL of bloody and turbid fluid from this area. IMPRESSION: CT-guided aspiration of pericolonic fluid adjacent to the sigmoid colon yielding 4 mL of bloody and turbid fluid. The fluid sample was sent for culture analysis. Electronically Signed   By: Aletta Edouard M.D.   On: 02/22/2019 10:58   [place] Ekg Site Rite  Result Date: 02/19/2019 If Site Rite image not attached, placement could not be confirmed due to current cardiac rhythm.   ASSESSMENT AND PLAN: 1. 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  3. H/o Small bowel obstruction/ileuswith sigmoid thickening/stenosis . 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 EGreer Pickerel 4. 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 GI panel and C. difficile negative.  CMV IgM negative, EBV IgM negative. 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 Sed rate and CRP still elevated  5.Hypovolemic hyponatremia. Other electrolyte issues including hypokalemia and hypomagnesemia  6. Pancytopenia -  Anemia likely multifactorial but could be from GI bleeding plus significant inflammation from severe radiation colitis. Leukopenia/lymphopenia likely from radiation toxicity. Thrombocytopenia from consumption related to GI bleeding and from radiation toxicity.- nearly normalized. B12 within normal limits Copper levels within normal limits at 132 Zinc deficiency noted- 44 rpt this admission 60 Viral work-upnot revealing Low likelihood of Burkitt's lymphoma progression at this time.  7. Admitted with failure to thrive, dehydration electrolyte abnormalities and hyperglycemia with poor p.o. intake. Likely ongoing GI losses from radiation  colitis.  8. Depression - discussed with patient. Continue Celexa and optimize dose to 28min 1-2 weeks.  9. Symptomatic anemia -transfuse prn to maintain hgb>8 given symptoms and some GI losses.  10. Moderate-severe protein calorie malnutrition and muscle waste  11. Hypogonadism - secondary. Very low testosterone levels.  PLAN -Labs reviewed with the patient.  Anticipate some blood count changes due to hemodilution from TPN. WBC stable. -Recommend PRBC transfusion for hemoglobin less than 8.0 due to ongoing GI blood losses.  Hemoglobin remains stable.    -optimize nutrition -Continue Oxandrolone 62m862mo BID -Eliquis on hold due to procedures.  -Appreciate excellent hospitalist care. -Monitor electrolytes phosphorus, potassium and magnesium replacement -continue Zinc replacement -continue TPN -optimize DM2 management- might need addition of basal insulin. -may need to consider ESA for anemia or chronic disease PT/OT evaluation, out of bed to chair. -Abdominal abscesses noted on CT scan. S/P CT guided aspiration of the peritoneal fluid.  Culture showed rare pseudomonas aeruginosa and rare Morganella morganii. On TPN. Continue IV antibiotics.  He was supposed to have endoscopy on 6/12/thousand 20, but the patient opted to hold off on this. -Ongoing high ileostomy output. GI following. Stool for C diff negative. GI panel negative.  Also consider talking with radiation oncology regarding any additional intervention recommended for treatment of radiation colitis. -Patient will D/C to SNF when medically stable.    LOS: 18 days   KriMikey Bussing  DNP, AGPCNP-BC, AOCNP 02/26/19  ADDENDUM  .Patient was Personally and independently interviewed, examined and relevant elements of the history of present illness were reviewed in details and an assessment and plan was created. All elements of the patient's history of present illness , assessment and plan were discussed in details with **. The  above documentation reflects our combined findings assessment and plan.  Sullivan Lone MD MS

## 2019-02-27 ENCOUNTER — Inpatient Hospital Stay (HOSPITAL_COMMUNITY): Payer: BC Managed Care – PPO

## 2019-02-27 DIAGNOSIS — M6259 Muscle wasting and atrophy, not elsewhere classified, multiple sites: Secondary | ICD-10-CM

## 2019-02-27 DIAGNOSIS — C8510 Unspecified B-cell lymphoma, unspecified site: Secondary | ICD-10-CM

## 2019-02-27 DIAGNOSIS — K56699 Other intestinal obstruction unspecified as to partial versus complete obstruction: Secondary | ICD-10-CM

## 2019-02-27 LAB — AEROBIC/ANAEROBIC CULTURE W GRAM STAIN (SURGICAL/DEEP WOUND): Special Requests: NORMAL

## 2019-02-27 LAB — GLUCOSE, CAPILLARY
Glucose-Capillary: 159 mg/dL — ABNORMAL HIGH (ref 70–99)
Glucose-Capillary: 118 mg/dL — ABNORMAL HIGH (ref 70–99)
Glucose-Capillary: 131 mg/dL — ABNORMAL HIGH (ref 70–99)
Glucose-Capillary: 189 mg/dL — ABNORMAL HIGH (ref 70–99)

## 2019-02-27 MED ORDER — PIPERACILLIN-TAZOBACTAM 3.375 G IVPB
3.3750 g | Freq: Three times a day (TID) | INTRAVENOUS | Status: DC
  Administered 2019-02-27 – 2019-03-06 (×21): 3.375 g via INTRAVENOUS
  Filled 2019-02-27 (×21): qty 50

## 2019-02-27 MED ORDER — TRAVASOL 10 % IV SOLN
INTRAVENOUS | Status: AC
  Administered 2019-02-27: 18:00:00 via INTRAVENOUS
  Filled 2019-02-27: qty 1254

## 2019-02-27 MED ORDER — IOHEXOL 300 MG/ML  SOLN
100.0000 mL | Freq: Once | INTRAMUSCULAR | Status: AC | PRN
  Administered 2019-02-27: 100 mL via INTRAVENOUS

## 2019-02-27 MED ORDER — IOHEXOL 300 MG/ML  SOLN: 15.0000 mL | Freq: Once | INTRAMUSCULAR | Status: DC | PRN

## 2019-02-27 MED ORDER — SODIUM CHLORIDE (PF) 0.9 % IJ SOLN
INTRAMUSCULAR | Status: AC
  Filled 2019-02-27: qty 50

## 2019-02-27 NOTE — Progress Notes (Signed)
Daily Progress Note   Patient Name: Mark Frey       Date: 02/27/2019 DOB: 1955-11-15  Age: 63 y.o. MRN#: [id number] Attending Physician: Tawni Millers Primary Care Physician: [name], MD Admit Date: 02/07/2019  Reason for Consultation/Follow-up: Establishing goals of care  Subjective: Chart reviewed including personal review of pertinent labs and imaging.  Called and discussed case with oncology, GI, and surgical teams.  I met today with patient and his wife, [name], at the bedside.  He is reported to be in better spirits today and they were able to speak with Dr. Cathlean Sauer.  Mr. Dollens has been reluctant to pursue any procedures or consideration for surgical intervention, but today he reports that he is "ready to get things going."  When asked to clarify this, he reports that he is open to colonoscopy/ileostomy if still recommended following repeat CT scan.  He and his wife both report understanding that he is seriously ill, but his wife states that he looks better today than she had anticipated, and if he desires to keep pursuing medical interventions, then she will continue to support him.  She states that he was seriously debating if he wanted to come to the hospital prior to this admission, and they had discussed electing hospice services prior to admission.  While he currently wants to continue with hospital interventions, he and his wife are open to future consideration for hospice support based on his continued clinical course.  Length of Stay: 19  Current Medications: Scheduled Meds:   cholestyramine light  4 g Oral 3 times per day   citalopram  10 mg Oral Daily   dexamethasone  1 mg Oral Q breakfast   feeding supplement  1 Container Oral Q24H    feeding supplement (PRO-STAT SUGAR FREE 64)  30 mL Oral Daily   insulin aspart  0-15 Units Subcutaneous 4 times per day   multivitamin with minerals  1 tablet Oral Daily   nutrition supplement (JUVEN)  1 packet Oral BID BM   oxandrolone  5 mg Oral BID   Short-Chain Fatty Acids Soln (60 mL)  60 mL Per Tube BID   zinc sulfate  220 mg Oral BID    Continuous Infusions:  sodium chloride 10 mL/hr at 02/26/19 1800   piperacillin-tazobactam     TPN  CYCLIC-ADULT (ION)      PRN Meds: sodium chloride, acetaminophen **OR** acetaminophen, heparin lock flush, HYDROcodone-acetaminophen, HYDROmorphone (DILAUDID) injection, iohexol, iohexol, ondansetron **OR** ondansetron (ZOFRAN) IV, sodium chloride flush  Physical Exam         General: Alert, awake, in no acute distress.  HEENT: No bruits, no goiter, no JVD Heart: Regular rate and rhythm. No murmur appreciated. Lungs: Good air movement, clear Abdomen: Soft, + ostomy  Ext: No significant edema Skin: Warm and dry Neuro: Grossly intact, nonfocal.  Vital Signs: BP (!) 108/55 (BP Location: Right Arm)    Pulse 71    Temp 98.5 F (36.9 C) (Oral)    Resp 20    Ht _0  (1.753 m)    Wt 69 kg    SpO2 100%    BMI 22.46 kg/m  SpO2: SpO2: 100 % O2 Device: O2 Device: Room Air O2 Flow Rate: O2 Flow Rate (L/min): 2 L/min  Intake/output summary:   Intake/Output Summary (Last 24 hours) at 02/27/2019 1418 Last data filed at 02/27/2019 0600 Gross per 24 hour  Intake 2758.7 ml  Output 400 ml  Net 2358.7 ml   LBM: Last BM Date: 02/27/19 Baseline Weight: Weight: 69 kg Most recent weight: Weight: 69 kg       Palliative Assessment/Data:    Flowsheet Rows     Most Recent Value  Intake Tab  Referral Department  Hospitalist  Unit at Time of Referral  ER  Palliative Care Primary Diagnosis  Cancer  Date Notified  02/07/19  Palliative Care Type  New Palliative care  Reason for referral  Clarify Goals of Care  Date of Admission  02/07/19    Date first seen by Palliative Care  02/08/19  # of days Palliative referral response time  1 Day(s)  # of days IP prior to Palliative referral  0  Clinical Assessment  Psychosocial & Spiritual Assessment  Palliative Care Outcomes      Patient Active Problem List   Diagnosis Date Noted   Pancolitis ([place]) 02/22/2019   Abscess of sigmoid colon 02/22/2019   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) 02/22/2019   Thrombocytopenia (HCC)    Abnormal CT scan, gastrointestinal tract    Depression    Symptomatic anemia    Atrophy of muscle of multiple sites    Palliative care by specialist    Goals of care, counseling/discussion    Dehydration 02/07/2019   Hypomagnesemia 01/11/2019   Neutropenia ([place]) 01/11/2019   Other neutropenia (HCC)    Pressure injury of skin 01/07/2019   Zinc deficiency    Malnutrition of moderate degree 01/04/2019   Radiation gastroenteritis    Radiation colitis    Hyponatremia 01/01/2019   Pancytopenia ([place] 01/01/2019   Hematochezia    Loop ilesotomy for fecal diversion of sigmoid stricture 08/25/2018   Abnormal CT of the abdomen    Severe protein-calorie malnutrition ([place]) 08/19/2018   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   Diverticulitis of colon    Low magnesium level 07/30/2018   Hypokalemia 07/21/2018   Bowel obstruction in setting of lymphoma and sigmoid stricture 07/21/2018   Diarrhea 07/21/2018   Diabetes mellitus (HCC)    Edema    Deep vein thrombosis (DVT) of femoral vein of right lower extremity ([place])    Counseling regarding advance care planning and goals of care 04/25/2018   Gastrointestinal hemorrhage    Encounter for antineoplastic chemotherapy    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[place])  04/10/2018   Port-A-Cath in place 03/27/2018   Acute deep vein thrombosis (DVT) of femoral vein of right lower extremity (HCC)    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[place])  03/15/2018   Bilateral pulmonary embolism ([place]) 03/10/2018   Anemia 03/10/2018   Hypoglycemia 03/10/2018   Occult blood in stools 03/10/2018   Abdominal mass 03/10/2018   Tachycardia 01/31/2017   Controlled diabetes mellitus type II without complication ([place]) 49/44/9675   Hypertension 06/05/2014   Irritable bowel syndrome 03/30/2010   Morbid obesity ([place]) 91/63/8466   Dysmetabolic syndrome X 59/93/5701   BACK PAIN WITH RADICULOPATHY 06/09/2007   Hyperlipidemia 05/12/2007   ALLERGIC RHINITIS 05/12/2007    Palliative Care Assessment & Plan   Patient Profile:    Assessment: High grade Burkitt's lymphoma, B cell lymphoma. Severe pan colitis, radiation toxicity/diversion colitis Pancytopenia H/O DVT PE Failure to thrive Electrolyte abnormalities depression  Recommendations/Plan: - I met today with patient and his wife.  They have also spoken with Dr. Cathlean Sauer and Mr. Boomhower is open to continuation of current therapies, reevaluation by CT scan, and then further workup with colonoscopy/ileostomy if necessary.  He would like to have a better idea of what he is looking at prior to making decision about surgical intervention if necessary, but reports that he would still consider surgical interventions if necessary.  He has considered electing for hospice services at home but is not interested in hospice at this time. - I provided my card and contact information and will continue to check in with Mr. Torrez periodically.  His wife will call if our team can be of further assistance.  Code Status:    Code Status Orders  (From admission, onward)         Start     Ordered   02/07/19 1355  Do not attempt resuscitation (DNR)  Continuous    Question Answer Comment  In the event of cardiac or respiratory ARREST Do not call a code blue   In the event of cardiac or respiratory ARREST Do not perform Intubation, CPR, defibrillation or ACLS   In the event of cardiac or  respiratory ARREST Use medication by any route, position, wound care, and other measures to relive pain and suffering. May use oxygen, suction and manual treatment of airway obstruction as needed for comfort.      02/07/19 1354        Code Status History    Date Active Date Inactive Code Status Order ID Comments User Context   02/07/2019 1336 02/07/2019 1354 DNR [id number]  Mariel Aloe, MD Inpatient   01/01/2019 1254 01/16/2019 2130 Full Code [id number]  Debbe Odea, MD ED   08/18/2018 0146 08/28/2018 1729 Full Code [id number]  [name], MD ED   07/30/2018 2133 08/04/2018 1735 Full Code [id number]  [name], MD Inpatient   07/21/2018 1458 07/24/2018 1551 Full Code [id number]  [name], MD ED   07/03/2018 1053 07/07/2018 1645 Full Code [id number]  [name], MD Inpatient   06/12/2018 1113 06/16/2018 1542 Full Code [id number]  [name], MD Inpatient   05/22/2018 1036 05/26/2018 1644 Full Code [id number]  [name], MD Inpatient   05/01/2018 0959 05/05/2018 2317 Full Code [id number]  [name], MD Inpatient   04/10/2018 1604 04/14/2018 1640 Full Code [id number]  [name], MD Inpatient   03/10/2018 2125 03/25/2018 2126 Full Code [id number]  [name], MD Inpatient       Prognosis:   Unable to determine  Discharge Planning:  To Be Determined  Care plan was discussed with patient, his wife, bedside RN, Dr. Cathlean Sauer, surgery, oncology, and GI  Thank you for allowing the Palliative Medicine Team to assist in the care of this patient.   Time In: 1315 Time Out: 1400 Total Time 45 Prolonged Time Billed  no       Greater than 50%  of this time was spent counseling and coordinating care related to the above assessment and plan.  Micheline Rough, MD [facility] Team [phone]   Please contact Palliative Medicine Team phone at [phone] for questions and concerns.

## 2019-02-27 NOTE — Progress Notes (Signed)
Pt refused to drink the contrast for CT. This RN educated Pt on the importance of contrast for imaging purposes and encouraged pt to drink several times. Pt stated, "They are going to do it anyway, it doesn't matter if I drink it, I am not going to." I verified that patient did want to proceed with treatment and have CT scan done, he confirmed that he wanted treatment but was still not going to drink the contrast. Will continue to monitor.

## 2019-02-27 NOTE — Progress Notes (Signed)
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 Usual Weight: 190 lb - 15% weight loss in last month per RD  Current Nutrition: Full liquid diet ordered- ate cup of soup yesterday, TPN  IVF: none  Central access: Port TPN start date: 5/29  ASSESSMENT                                                                                                          HPI: 63 yo male with burkitt's lymphoma s/p chemo now with severe radiation colitis, prolonged malnutrition, multiple recent admissions to start TPN per pharmacy management  Significant events:  6/3 repeat CT abd/pelvis> Descending and sigmoid colitis with fluid collections, not drainable by IR, Zosyn added, made NPO 6/9: apixaban d/c'd for plan for endoscopic eval in next few days. And possibly colonic resection in future per GI notes 6/11: CT guided aspiration of LLQ fluid per IR this AM 6/13: colonoscopy and ileoscopy postponed yesterday, to re-evaluate next week, 6/14: palliative care consulted.  Family mtg planned for 6/6 PM.  Today, 02/27/19  Glucose - goal < 017 while on cyclic TPN (tolerate higher goal CBGs while on cyclic) - 6 Units BLT/90 hours;   CBGs now at goal (173).  CBG 1h after TPN off at goal (118).  Note hx DM and chronic dexamethasone 1 mg/day.  Electrolytes - WNL on 6/15  Renal - WNL on 6/15  LFTs - AST/ALT stable/WNL on 6/15  TGs - 71 (6/1), 98 (6/8), 66(6/15)  Prealbumin - 7.7 (6/1), 16 (6/8) oxandrin added 5/29, dose increased 6/1. 21.7 (6/15)  NUTRITIONAL GOALS                                                                                             RD recs: 2300-2500  kcal/day, 115-125g protein/day  Custom TPN at goal of 95 ml/hr over 30SPQ or 12 hr cyclic administration getting 2280 ml to provide: 125 g/day protein, 2270Kcal/day.  PLAN  At 1800 today:  Continue custom cyclic TPN with 4401 mls/day over 12 hours = full support  Change to oral multivitamin/mineral due to national shortage.  Standard TPN concentrations of lytes: Na at 125 mEq/L, K at 45, Mag 8, UU:VOZD 1:1 and increased phos at 20  IVF: none  Continue modSSI/CBGs - custom schedule for cyclic TPN  Continue 25 units insulin in TPN  TPN lab panels on Mondays & Thursdays.   Netta Cedars, PharmD, BCPS 02/27/2019 10:17 AM

## 2019-02-27 NOTE — Progress Notes (Signed)
PROGRESS NOTE    Mark Frey  TKZ:[id number] DOB: [birth year] DOA: 02/07/2019 PCP: [name], MD    Brief Narrative:  63 yo male who presented with weakness.  He does have significant past medical history of Burkitt's lymphoma status post radiation and chemotherapy, type II diabetes mellitus, PE/DVT and radiation colitis.  Patient developed worsening weakness for several weeks, to the point where he was not able to stand by his own.  On his initial physical examination his temperature was 99, heart rate 110, respiratory rate 18, blood pressure 104/74, oxygen saturation 100%.  His lungs were clear to auscultation bilaterally, heart S1-S2 present rhythmic, abdomen was soft, nontender, no lower extremity edema, patient had weakness on the left lower extremity compared to the right.  His affect was flat.   Patient was admitted to the hospital working diagnosis of dehydration complicated by anion gap metabolic acidosis and metabolic encephalopathy.  Patient continued to deteriorate, he was placed on TPN for nutrition.  CT of the abdomen and pelvis showed multiple fluid collections along the course of the descending sigmoid colon consistent with developing abscess.  Patient was placed on IV antibiotic therapy and surgery was consulted.  June 11 patient underwent percutaneous drainage of abscess collection.  Has declined their laparotomy with possible colectomy and ileostomy.   He has been referred to palliative care services.    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HCC)   Deep vein thrombosis (DVT) of femoral vein of right lower extremity (HCC)   Diabetes mellitus (HCC)   Bowel obstruction in setting of lymphoma and sigmoid stricture   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HCC)   Hyponatremia    Malnutrition of moderate degree   Dehydration   Palliative care by specialist   Goals of care, counseling/discussion   Symptomatic anemia   Atrophy of muscle of multiple sites   Depression   Abnormal CT scan, gastrointestinal tract   Thrombocytopenia (HCC)   Abscess of sigmoid colon   Loop ilesotomy for fecal diversion of sigmoid stricture   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  Multiple abdominal abscesses/ pseudomonas infection/ present on admission. Last CT from 06.08.20 with persistent pancolitis, possible communicating lesion between the ileum and sigmoid, less prominent fluid collection at the sigmoid colon. 02/22/19 aspiration of anterior and lateral proximal sigmoid collection, 4 ml bloody and turbid fluid, culture positive for pseudomonas and morganella, sensitive for PIP/TAZO and cefepime. Wbc is down to 3,5 as per yesterday and he has been afebrile. Patient has declined endoscopic or surgical exploration, certainly he has high risk for surgery. Will continue antibiotic therapy with Zoysn, follow cell count and will plan to repeat CT of the abdomen and pelvis.   2. High grade B cell lymphoma.  Continue decadron. It has been on remission.   3. T2DM. Fasting glucose 165 mg/dl, will continue glucose cover and monitoring with insulin sliding scale. No nausea or vomiting.   4. Calorie protein malnutrition. On TPN for nutrition. Poor oral intake.   5. Right lower extremity dvt. Holding on anticoagulation for now for possible procedure.     DVT prophylaxis: scd   Code Status: dnr  Family Communication: no family at the bedside  Disposition Plan/ discharge barriers: pending palliative care consultations. Possible hospice.  Body mass index is 22.46 kg/m. Malnutrition Type:  Nutrition Problem: Increased nutrient needs Etiology: chronic illness, cancer and cancer related treatments   Malnutrition Characteristics:  Signs/Symptoms:  estimated needs   Nutrition Interventions:  Interventions: Boost Breeze, TPN, Juven, Prostat  RN Pressure Injury Documentation:    Consultants:   Surgery   Palliative Care  GI   Procedures:   Drainage of abdominal collection   Antimicrobials:   Cefepime 06/16<  Zosyn 06/16 >    Subjective: Patient continue to have right lower abdominal pain, dull in nature, moderate in intensity, no radiation, able to tolerate po well. No nausea or vomiting.   Objective: Vitals:   02/26/19 0535 02/26/19 1316 02/26/19 2053 02/27/19 0621  BP: 111/67 106/73 110/72 (!) 108/55  Pulse: 93 84 86 71  Resp:  18 16 20   Temp: 98.9 F (37.2 C) 98.4 F (36.9 C) 98.6 F (37 C) 98.5 F (36.9 C)  TempSrc: Oral Oral Oral Oral  SpO2: 99% 97% 97% 100%  Weight:      Height:        Intake/Output Summary (Last 24 hours) at 02/27/2019 1237 Last data filed at 02/27/2019 0600 Gross per 24 hour  Intake 2825.71 ml  Output 725 ml  Net 2100.71 ml   Filed Weights   02/08/19 1726 02/22/19 1321  Weight: 69 kg 69 kg    Examination:   General: deconditioned  Neurology: Awake and alert, non focal  E ENT: mild pallor, no icterus, oral mucosa moist Cardiovascular: No JVD. S1-S2 present, rhythmic, no gallops, rubs, or murmurs. No lower extremity edema. Pulmonary: positive breath sounds bilaterally, adequate air movement, no wheezing, rhonchi or rales. Gastrointestinal. Abdomen mild distended with no organomegaly, tender to deep palpation, no rebound or guarding Skin. No rashes Musculoskeletal: no joint deformities     Data Reviewed: I have personally reviewed following labs and imaging studies  CBC: Recent Labs  Lab 02/21/19 0504 02/22/19 0440 02/23/19 0336 02/26/19 0311  WBC 3.7* 3.5* 3.4* 3.5*  NEUTROABS 2.7 2.6 2.4 2.5  HGB 8.2* 8.2* 8.5* 7.6*  HCT 26.8* 26.8* 27.0* 25.2*  MCV 99.6 100.0 99.6 101.2*  PLT 110* 109* 121* 650*   Basic Metabolic Panel: Recent Labs  Lab 02/21/19  0504 02/22/19 0440 02/23/19 0336 02/25/19 0330 02/26/19 0311  NA 134* 136 136 137 136  K 4.2 4.0 4.0 4.1 4.2  CL 102 102 103 103 103  CO2 26 25 25 28 25   GLUCOSE 187* 169* 179* 97 165*  BUN 21 20 20 19 21   CREATININE 0.35* 0.36* 0.36* 0.32* 0.31*  CALCIUM 8.1* 8.1* 8.3* 8.5* 8.3*  MG 1.8 1.9 1.9  --  1.7  PHOS 2.6 2.4* 3.0  --  3.2   GFR: Estimated Creatinine Clearance: 93.4 mL/min (A) (by C-G formula based on SCr of 0.31 mg/dL (L)). Liver Function Tests: Recent Labs  Lab 02/22/19 0440 02/26/19 0311  AST 19 13*  ALT 36 26  ALKPHOS 82 65  BILITOT 0.1* 0.2*  PROT 5.1* 5.2*  ALBUMIN 2.0* 2.1*   No results for input(s): LIPASE, AMYLASE in the last 168 hours. No results for input(s): AMMONIA in the last 168 hours. Coagulation Profile: Recent Labs  Lab 02/22/19 0440  INR 1.0   Cardiac Enzymes: No results for input(s): CKTOTAL, CKMB, CKMBINDEX, TROPONINI in the last 168 hours. BNP (last 3 results) No results for input(s): PROBNP in the last 8760 hours. HbA1C: No results for input(s): HGBA1C in the last 72 hours. CBG: Recent Labs  Lab 02/26/19 0656 02/26/19 1315 02/26/19 2048 02/27/19 0204 02/27/19 0617  GLUCAP 87 118* 173* 159* 118*   Lipid Profile: Recent Labs    02/26/19 0311  TRIG 66   Thyroid Function Tests: No results for input(s): TSH, T4TOTAL, FREET4, T3FREE, THYROIDAB in the last 72 hours. Anemia Panel: No results for input(s): VITAMINB12, FOLATE, FERRITIN, TIBC, IRON, RETICCTPCT in the last 72 hours.    Radiology Studies: I have reviewed all of the imaging during this hospital visit personally     Scheduled Meds: . cholestyramine light  4 g Oral 3 times per day  . citalopram  10 mg Oral Daily  . dexamethasone  1 mg Oral Q breakfast  . feeding supplement  1 Container Oral Q24H  . feeding supplement (PRO-STAT SUGAR FREE 64)  30 mL Oral Daily  . insulin aspart  0-15 Units Subcutaneous 4 times per day  . multivitamin with minerals  1  tablet Oral Daily  . nutrition supplement (JUVEN)  1 packet Oral BID BM  . oxandrolone  5 mg Oral BID  . Short-Chain Fatty Acids Soln (60 mL)  60 mL Per Tube BID  . zinc sulfate  220 mg Oral BID   Continuous Infusions: . sodium chloride 10 mL/hr at 02/26/19 1800  . ceFEPime (MAXIPIME) IV 2 g (02/27/19 0522)  . TPN CYCLIC-ADULT (ION)       LOS: 19 days        Dustee Bottenfield Gerome Apley, MD

## 2019-02-27 NOTE — Progress Notes (Signed)
Pharmacy Antibiotic Note  Mark Frey is a 63 y.o. male admitted on 02/07/2019 with abdominal abscess.  Pharmacy has been consulted for Zosyn dosing. He is well known to pharmacy.  This is total antibiotic day #14.  Renal function stable, CrCl >47m/min.   Plan: Zosyn 3.375g IV q8h (4 hour infusion).  No dose adjustment anticipated.  Pharmacy will sign off & follow peripherally.   Height: 5' 9"  (175.3 cm) Weight: 152 lb 1.9 oz (69 kg) IBW/kg (Calculated) : 70.7  Temp (24hrs), Avg:98.6 F (37 C), Min:98.5 F (36.9 C), Max:98.6 F (37 C)  Recent Labs  Lab 02/21/19 0504 02/22/19 0440 02/23/19 0336 02/25/19 0330 02/26/19 0311  WBC 3.7* 3.5* 3.4*  --  3.5*  CREATININE 0.35* 0.36* 0.36* 0.32* 0.31*    Estimated Creatinine Clearance: 93.4 mL/min (A) (by C-G formula based on SCr of 0.31 mg/dL (L)).    Allergies  Allergen Reactions  . Ciprofloxacin Other (See Comments)    Leg tingling    Antimicrobials this admission: Piperacillin/tazobactam 6/3 >>  6/13 then 6/16 >> Cefepime 6/13>> 6/16  Dose adjustments this admission:   Microbiology results: 5/27 BCx: NGF 5/27 UCx: NGF  5/27 COVID: Negative 6/3 BCx2: ngtd 6/11 abscess: rare pseudomonas (pan-sens)  Thank you for allowing pharmacy to be a part of this patient's care.  LBiagio Borg6/16/2020 1:48 PM

## 2019-02-28 DIAGNOSIS — K630 Abscess of intestine: Principal | ICD-10-CM

## 2019-02-28 LAB — BASIC METABOLIC PANEL
Anion gap: 9 (ref 5–15)
BUN: 18 mg/dL (ref 8–23)
CO2: 26 mmol/L (ref 22–32)
Calcium: 8.7 mg/dL — ABNORMAL LOW (ref 8.9–10.3)
Chloride: 102 mmol/L (ref 98–111)
Creatinine, Ser: 0.32 mg/dL — ABNORMAL LOW (ref 0.61–1.24)
GFR calc Af Amer: >60 mL/min (ref >60)
GFR calc non Af Amer: >60 mL/min (ref >60)
Glucose, Bld: 157 mg/dL — ABNORMAL HIGH (ref 70–99)
Potassium: 4.1 mmol/L (ref 3.5–5.1)
Sodium: 137 mmol/L (ref 135–145)

## 2019-02-28 LAB — CBC WITH DIFFERENTIAL/PLATELET
Abs Immature Granulocytes: 0.03 10*3/uL (ref 0.00–0.07)
Basophils Absolute: 0 10*3/uL (ref 0.0–0.1)
Basophils Relative: 1 %
Eosinophils Absolute: 0 10*3/uL (ref 0.0–0.5)
Eosinophils Relative: 1 %
HCT: 25.5 % — ABNORMAL LOW (ref 39.0–52.0)
Hemoglobin: 7.8 g/dL — ABNORMAL LOW (ref 13.0–17.0)
Immature Granulocytes: 1 %
Lymphocytes Relative: 10 %
Lymphs Abs: 0.3 10*3/uL — ABNORMAL LOW (ref 0.7–4.0)
MCH: 30.8 pg (ref 26.0–34.0)
MCHC: 30.6 g/dL (ref 30.0–36.0)
MCV: 100.8 fL — ABNORMAL HIGH (ref 80.0–100.0)
Monocytes Absolute: 0.5 10*3/uL (ref 0.1–1.0)
Monocytes Relative: 15 %
Neutro Abs: 2.3 10*3/uL (ref 1.7–7.7)
Neutrophils Relative %: 72 %
Platelets: 138 10*3/uL — ABNORMAL LOW (ref 150–400)
RBC: 2.53 MIL/uL — ABNORMAL LOW (ref 4.22–5.81)
RDW: 18.5 % — ABNORMAL HIGH (ref 11.5–15.5)
WBC: 3.2 10*3/uL — ABNORMAL LOW (ref 4.0–10.5)
nRBC: 0 % (ref 0.0–0.2)

## 2019-02-28 LAB — GLUCOSE, CAPILLARY
Glucose-Capillary: 160 mg/dL — ABNORMAL HIGH (ref 70–99)
Glucose-Capillary: 69 mg/dL — ABNORMAL LOW (ref 70–99)
Glucose-Capillary: 86 mg/dL (ref 70–99)
Glucose-Capillary: 90 mg/dL (ref 70–99)
Glucose-Capillary: 71 mg/dL (ref 70–99)

## 2019-02-28 MED ORDER — CITALOPRAM HYDROBROMIDE 20 MG PO TABS
20.0000 mg | ORAL_TABLET | Freq: Every day | ORAL | Status: DC
  Administered 2019-03-01 – 2019-03-06 (×6): 20 mg via ORAL
  Filled 2019-02-28 (×6): qty 1

## 2019-02-28 MED ORDER — TRAVASOL 10 % IV SOLN
INTRAVENOUS | Status: DC
  Filled 2019-02-28: qty 1254

## 2019-02-28 NOTE — Progress Notes (Signed)
Hypoglycemic Event  CBG:69  Treatment: Orange juice   Symptoms: None   Follow-up CBG: Time:0804 CBG Result:86  Possible Reasons for Event: TPN     Mark Frey Angelica Ran

## 2019-02-28 NOTE — Progress Notes (Addendum)
Daily Progress Note   Patient Name: Mark Frey       Date: 02/28/2019 DOB: 1956-05-31  Age: 63 y.o. MRN#: [id number] Attending Physician: Tawni Millers Primary Care Physician: [name], MD Admit Date: 02/07/2019  Reason for Consultation/Follow-up: Establishing goals of care  Subjective: Chart reviewed including review of most recent CT imaging.  I met today with patient.  He is lying in bed and watching TV.  Mark Frey confirms again that he is open to colonoscopy/ileostomy if still recommended once GI reevaluates him today.  Reviewed my interpretation of repeat CT findings.  He is awaiting GI input on their impression of findings.  Reports pain, "about the same" and no other concerns today per his report.  Length of Stay: 20  Current Medications: Scheduled Meds:  . cholestyramine light  4 g Oral 3 times per day  . [START ON 03/01/2019] citalopram  20 mg Oral Daily  . dexamethasone  1 mg Oral Q breakfast  . insulin aspart  0-15 Units Subcutaneous 4 times per day  . multivitamin with minerals  1 tablet Oral Daily  . oxandrolone  5 mg Oral BID  . Short-Chain Fatty Acids Soln (60 mL)  60 mL Per Tube BID  . zinc sulfate  220 mg Oral BID    Continuous Infusions: . sodium chloride Stopped (02/27/19 1430)  . piperacillin-tazobactam 3.375 g (02/28/19 0507)  . TPN CYCLIC-ADULT (ION)      PRN Meds: sodium chloride, acetaminophen **OR** acetaminophen, heparin lock flush, HYDROcodone-acetaminophen, HYDROmorphone (DILAUDID) injection, iohexol, iohexol, ondansetron **OR** ondansetron (ZOFRAN) IV, sodium chloride flush  Physical Exam         General: Alert, awake, in no acute distress.  HEENT: No bruits, no goiter, no JVD Heart: Regular rate and rhythm. No  murmur appreciated. Lungs: Good air movement, clear Abdomen: Soft, + ostomy  Ext: No significant edema Skin: Warm and dry Neuro: Grossly intact, nonfocal.  Vital Signs: BP 95/67 (BP Location: Right Arm)   Pulse 88   Temp 98.6 F (37 C) (Oral)   Resp 12   Ht 5' 9"  (1.753 m)   Wt 69 kg   SpO2 98%   BMI 22.46 kg/m  SpO2: SpO2: 98 % O2 Device: O2 Device: Room Air O2 Flow Rate: O2 Flow Rate (L/min): 2 L/min  Intake/output  summary:   Intake/Output Summary (Last 24 hours) at 02/28/2019 1156 Last data filed at 02/28/2019 0600 Gross per 24 hour  Intake 2530.24 ml  Output 2300 ml  Net 230.24 ml   LBM: Last BM Date: 02/28/19 Baseline Weight: Weight: 69 kg Most recent weight: Weight: 69 kg       Palliative Assessment/Data:    Flowsheet Rows     Most Recent Value  Intake Tab  Referral Department  Hospitalist  Unit at Time of Referral  ER  Palliative Care Primary Diagnosis  Cancer  Date Notified  02/07/19  Palliative Care Type  New Palliative care  Reason for referral  Clarify Goals of Care  Date of Admission  02/07/19  Date first seen by Palliative Care  02/08/19  # of days Palliative referral response time  1 Day(s)  # of days IP prior to Palliative referral  0  Clinical Assessment  Psychosocial & Spiritual Assessment  Palliative Care Outcomes      Patient Active Problem List   Diagnosis Date Noted  . Pancolitis ([place]) 02/22/2019  . Abscess of sigmoid colon 02/22/2019  . 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 Thrombocytopenia ([place])   . Abnormal CT scan, gastrointestinal tract   . Depression   . Symptomatic anemia   . Atrophy of muscle of multiple sites   . Palliative care by specialist   . Goals of care, counseling/discussion   . Dehydration 02/07/2019  . Hypomagnesemia 01/11/2019  . Neutropenia ([place]) 01/11/2019  . Other neutropenia ([place])   . Pressure injury of skin 01/07/2019  . Zinc deficiency   . Malnutrition of  moderate degree 01/04/2019  . Radiation gastroenteritis   . Radiation colitis   . Hyponatremia 01/01/2019  . Pancytopenia ([place]) 01/01/2019  . Hematochezia   . Loop ilesotomy for fecal diversion of sigmoid stricture 08/25/2018  . Abnormal CT of the abdomen   . Severe protein-calorie malnutrition ([place]) 08/19/2018  . 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 Diverticulitis of colon   . Low magnesium level 07/30/2018  . Hypokalemia 07/21/2018  . Bowel obstruction in setting of lymphoma and sigmoid stricture 07/21/2018  . Diarrhea 07/21/2018  . Diabetes mellitus ([place])   . Edema   . Deep vein thrombosis (DVT) of femoral vein of right lower extremity ([place])   . Counseling regarding advance care planning and goals of care 04/25/2018  . Gastrointestinal hemorrhage   . Encounter for antineoplastic chemotherapy   . 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 Port-A-Cath in place 03/27/2018  . Acute deep vein thrombosis (DVT) of femoral vein of right lower extremity ([place])   . 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 Bilateral pulmonary embolism ([place]) 03/10/2018  . Anemia 03/10/2018  . Hypoglycemia 03/10/2018  . Occult blood in stools 03/10/2018  . Abdominal mass 03/10/2018  . Tachycardia 01/31/2017  . Controlled diabetes mellitus type II without complication ([place]) 93/81/0175  . Hypertension 06/05/2014  . Irritable bowel syndrome 03/30/2010  . Morbid obesity ([place]) 12/29/2009  . Dysmetabolic syndrome X 07/07/8526  . BACK PAIN WITH RADICULOPATHY 06/09/2007  . Hyperlipidemia 05/12/2007  . ALLERGIC RHINITIS 05/12/2007    Palliative Care Assessment & Plan   Patient Profile:    Assessment: High grade Burkitt's lymphoma, B cell lymphoma. Severe pan colitis, radiation toxicity/diversion colitis Pancytopenia H/O DVT PE Failure to thrive Electrolyte abnormalities depression  Recommendations/Plan: - I met today with patient.  He is now agreeable  colonoscopy/ileostomy.  He would like to have a better idea of what he is looking at prior to making decision about surgical intervention if necessary, but reports that he would still consider surgical interventions if necessary.  He has considered electing for hospice services at home but is not interested in hospice at this time. - Affect remains flat and depression has been concern.  He has been on 70m celexa for 2 weeks.  Will increase to 260mdaily. - I have provided my card and contact information and will continue to check in with Mr. CaFeaganseriodically.  His wife will call if our team can be of further assistance.  Code Status:    Code Status Orders  (From admission, onward)         Start     Ordered   02/07/19 1355  Do not attempt resuscitation (DNR)  Continuous    Question Answer Comment  In the event of cardiac or respiratory ARREST Do not call a "code blue"   In the event of cardiac or respiratory ARREST Do not perform Intubation, CPR, defibrillation or ACLS   In the event of cardiac or respiratory ARREST Use medication by any route, position, wound care, and other measures to relive pain and suffering. May use oxygen, suction and manual treatment of airway obstruction as needed for comfort.      02/07/19 1354        Code Status History    Date Active Date Inactive Code Status Order ID Comments User Context   02/07/2019 1336 02/07/2019 1354 DNR [id number] AloeMD Inpatient   01/01/2019 1254 01/16/2019 2130 Full Code [id number] OdeaMD ED   08/18/2018 0146 08/28/2018 1729 Full Code [id number] PatienceMD ED   07/30/2018 2133 08/04/2018 1735 Full Code [id number] PatienceMD Inpatient   07/21/2018 1458 07/24/2018 1551 Full Code [id number] CossMD ED   07/03/2018 1053 07/07/2018 1645 Full Code [id number] GeneraMD Inpatient   06/12/2018 1113 06/16/2018 1542 Full Code [id number] GeneraMD  Inpatient   05/22/2018 1036 05/26/2018 1644 Full Code [id number] GeneraMD Inpatient   05/01/2018 0959 05/05/2018 2317 Full Code [id number] GeneraMD Inpatient   04/10/2018 1604 04/14/2018 1640 Full Code [id number] GeneraMD Inpatient   03/10/2018 2125 03/25/2018 2126 Full Code [id number] BakerMD Inpatient       Prognosis:   Unable to determine  Discharge Planning:  To Be Determined  Care plan was discussed with patient  Thank you for allowing the Palliative Medicine Team to assist in the care of this patient.   Time In: 1040 Time Out: 1105 Total Time 25 Prolonged Time Billed  no       Greater than 50%  of this time was spent counseling and coordinating care related to the above assessment and plan.  GeMicheline RoughMD CoGreat [place]ream [phone] Please contact Palliative Medicine Team phone at [phone] questions and concerns.

## 2019-02-28 NOTE — Progress Notes (Signed)
PT Cancellation Note  Patient Details Name: Mark Frey MRN: [id number] DOB: Feb 21, 1956   Cancelled Treatment:    Reason Eval/Treat Not Completed: Patient declined, no reason specified   [name] 02/28/2019, 9:35 AM

## 2019-02-28 NOTE — Progress Notes (Addendum)
Progress Note    ASSESSMENT AND PLAN:    Colitis (diversion vrs radiation induced)  / intraabdominal abscesses.  He is s/p aspiration of LLQ fluid collection by IR on 02/22/19. Culture >>> PSEUDOMONAS AERUGINOSA. Appears fungus culture is still pending. Palliative Medicine met with patient and wife yesterday. Plan is to proceed with workup / treatment.  -repeat CTAP w/ contrast yesterday shows persistent diffuse wall thickening of the colon, stable to slightly decreased fluid collection surrounding the sigmoid and descending colon.  Areas of the colon are tethered to the abdominal wall -After discussion with the patient and wife (telephone) we will proceed with colonoscopy with biopsy tomorrow.  He is at increased risk for perforation given the significant colonic inflammation but the patient and his wife understand the risk. Colonoscopy be performed without bowel prep.  I do not think he can prep from above as the prep will come out of the diverting ileostomy and not reach the colon.  Hesitant to give him an enema with all of the inflammation present -currently on Zosyn  2. Failure to thrive / malnutrition / debility.  Getting TNA.  3. Pancytopenia, multifactorial. Stable  4. 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 I agree with the Advanced Practitioner's note, impression and recommendations.   Ms. Chester Holstein and I discussed his CT findings, his condition, recommendation for colonoscopy / ileoscopy to further evaluate together with the patient in his room and with his wife on speaker phone. Clearly outlined higher risk of complications with colonoscopy including perforation and bleeding given severe colitis however they felt the benefits outweighed the risks and the patient consented to proceed. With ileostomy a bowel prep will be challenging. Given severity of colitis I would  like to avoid enemas so will schedule unprepped colonoscopy for Thursday. Will limit the exam to a sigmoidoscopy if extending the procedure to a colonoscopy appear to be too high risk.   Lucio Edward, MD [facility]     SUBJECTIVE    Pain 'about the same". No new complaints  OBJECTIVE:     Vital signs in last 24 hours: Temp:  [98.5 F (36.9 C)-98.7 F (37.1 C)] 98.6 F (37 C) (06/17 0537) Pulse Rate:  [83-98] 88 (06/17 0537) Resp:  [12-19] 12 (06/17 0537) BP: (95-107)/(61-68) 95/67 (06/17 0537) SpO2:  [98 %-100 %] 98 % (06/17 0537) Last BM Date: 02/28/19 General:   Alert, well-developed male in NAD EENT:  Normal hearing, non icteric sclera, conjunctive pink.  Heart:  Regular rate and rhythm.  No lower extremity edema   Pulm: Normal respiratory effort Abdomen:  Soft, nondistended, mild LLQ tenderness.  Normal bowel sounds. Watery stool in ostomy bag   Neurologic:  Alert and  oriented x4;  grossly normal neurologically. Psych:  Pleasant, cooperative.  Normal mood and affect.   Intake/Output from previous day: 06/16 0701 - 06/17 0700 In: 2570.1 [P.O.:50; I.V.:2420.2; IV Piggyback:99.9] Out: 2300 [Urine:1825; Stool:475] Intake/Output this shift: No intake/output data recorded.  Lab Results: Recent Labs    02/26/19 0311 02/28/19 0325  WBC 3.5* 3.2*  HGB 7.6* 7.8*  HCT 25.2* 25.5*  PLT 123* 138*   BMET Recent Labs    02/26/19 0311 02/28/19 0325  NA 136 137  K 4.2 4.1  CL 103 102  CO2 25 26  GLUCOSE 165* 157*  BUN  18  CREATININE 0.31* 0.32*  CALCIUM 8.3* 8.7*   LFT Recent Labs    02/26/19 0311  PROT 5.2*  ALBUMIN 2.1*  AST 13*  ALT 26  ALKPHOS 65  BILITOT 0.2*   PT/INR No results for input(s): LABPROT, INR in the last 72 hours. Hepatitis Panel No results for input(s): HEPBSAG, HCVAB, HEPAIGM, HEPBIGM in the last 72 hours.  Ct Abdomen Pelvis W Contrast  Result Date: 02/27/2019 CLINICAL DATA:  Multiple fluid collections along the course of the  descending colon consistent with developing abscesses. Fever. Abdominal pain. EXAM: CT ABDOMEN AND PELVIS WITH CONTRAST TECHNIQUE: Multidetector CT imaging of the abdomen and pelvis was performed using the standard protocol following bolus administration of intravenous contrast. CONTRAST:  131m OMNIPAQUE IOHEXOL 300 MG/ML  SOLN COMPARISON:  02/19/2019 FINDINGS: Lower chest: No acute abnormality. Hepatobiliary: There are few hypoattenuating nodules in the liver which are stable from prior study and are likely of no clinical significance. There is mild gallbladder wall thickening. Pancreas: Unremarkable. No pancreatic ductal dilatation or surrounding inflammatory changes. Spleen: Normal in size without focal abnormality. Adrenals/Urinary Tract: There are nonobstructing stones in the lower pole the right kidney. There is no hydronephrosis. Left kidney is unremarkable. The adrenal glands are unremarkable. The urinary bladder is tethered to the sigmoid colon. Stomach/Bowel: Again identified are fluid collections along the descending colon and sigmoid colon. These fluid collections are stable to slightly decreased in size when compared to prior study. There is diffuse wall thickening of the nearby sigmoid colon and descending colon. The descending colon is tethered to the abdominal wall. In this location the fluid collection has decreased in size. A right lower quadrant ostomy is again noted. There is no evidence of a small-bowel obstruction. Vascular/Lymphatic: Aortic atherosclerosis. No enlarged abdominal or pelvic lymph nodes. Reproductive: Prostate is unremarkable. Other: There are small fat containing bilateral inguinal hernias. There is a small fat containing periumbilical hernia. Musculoskeletal: No acute or significant osseous findings. IMPRESSION: 1. Stable to slightly decreased fluid collection surrounding the sigmoid colon and descending colon. 2. Persistent diffuse wall thickening of virtually all of the  visualized colon consistent with colitis. A more nodular appearance of the sigmoid colon is of unknown clinical significance. Underlying tumor is not entirely excluded and would need to be correlated with colonoscopy. 3. The urinary bladder is tender to the sigmoid colon. There are multiple small bowel loops in the left lower quadrant that are tethered to the sigmoid colon. 4. Interval decrease in size of the fluid collection along the left lateral sidewall. The bowel remains tethered in this location. 5. Unchanged nonobstructing stone in the lower pole of the right kidney. 6. Mild wall thickening of the gallbladder. If there is clinical suspicion for acute cholecystitis, follow-up with ultrasound is recommended. Electronically Signed   By: CConstance HolsterM.D.   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HCC)   Deep vein thrombosis (DVT) of femoral vein of right lower extremity (HCC)   Diabetes mellitus (HCC)   Bowel obstruction in setting of lymphoma and sigmoid stricture   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HCC)   Hyponatremia   Malnutrition of moderate degree   Dehydration   Palliative care by specialist   Goals of care, counseling/discussion   Symptomatic anemia   Atrophy of muscle of  multiple sites   Depression   Abnormal CT scan, gastrointestinal tract   Thrombocytopenia (HCC)   Abscess of sigmoid colon   Loop ilesotomy for fecal diversion of sigmoid stricture   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

## 2019-02-28 NOTE — Progress Notes (Signed)
Physical Therapy Discharge Patient Details Name: Mark Frey MRN: [id number] DOB: 1956/03/18 Today's Date: 02/28/2019 Time:  -     Patient discharged from PT services secondary to patient has refused 3 (three) consecutive times without medical reason.  Please see latest therapy progress note for current level of functioning and progress toward goals.    Progress and discharge plan discussed with patient and/or caregiver: Patient/Caregiver agrees with plan  GP     [name] 02/28/2019, 9:35 AM

## 2019-02-28 NOTE — H&P (View-Only) (Signed)
Progress Note    ASSESSMENT AND PLAN:    Colitis (diversion vrs radiation induced)  / intraabdominal abscesses.  He is s/p aspiration of LLQ fluid collection by IR on 02/22/19. Culture >>> PSEUDOMONAS AERUGINOSA. Appears fungus culture is still pending. Palliative Medicine met with patient and wife yesterday. Plan is to proceed with workup / treatment.  -repeat CTAP w/ contrast yesterday shows persistent diffuse wall thickening of the colon, stable to slightly decreased fluid collection surrounding the sigmoid and descending colon.  Areas of the colon are tethered to the abdominal wall -After discussion with the patient and wife (telephone) we will proceed with colonoscopy with biopsy tomorrow.  He is at increased risk for perforation given the significant colonic inflammation but the patient and his wife understand the risk. Colonoscopy be performed without bowel prep.  I do not think he can prep from above as the prep will come out of the diverting ileostomy and not reach the colon.  Hesitant to give him an enema with all of the inflammation present -currently on Zosyn  2. Failure to thrive / malnutrition / debility.  Getting TNA.  3. Pancytopenia, multifactorial. Stable  4. 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 I agree with the Advanced Practitioner's note, impression and recommendations.   Ms. Chester Holstein and I discussed his CT findings, his condition, recommendation for colonoscopy / ileoscopy to further evaluate together with the patient in his room and with his wife on speaker phone. Clearly outlined higher risk of complications with colonoscopy including perforation and bleeding given severe colitis however they felt the benefits outweighed the risks and the patient consented to proceed. With ileostomy a bowel prep will be challenging. Given severity of colitis I would  like to avoid enemas so will schedule unprepped colonoscopy for Thursday. Will limit the exam to a sigmoidoscopy if extending the procedure to a colonoscopy appear to be too high risk.   Mark Edward, MD [facility]     SUBJECTIVE    Pain 'about the same". No new complaints  OBJECTIVE:     Vital signs in last 24 hours: Temp:  [98.5 F (36.9 C)-98.7 F (37.1 C)] 98.6 F (37 C) (06/17 0537) Pulse Rate:  [83-98] 88 (06/17 0537) Resp:  [12-19] 12 (06/17 0537) BP: (95-107)/(61-68) 95/67 (06/17 0537) SpO2:  [98 %-100 %] 98 % (06/17 0537) Last BM Date: 02/28/19 General:   Alert, well-developed male in NAD EENT:  Normal hearing, non icteric sclera, conjunctive pink.  Heart:  Regular rate and rhythm.  No lower extremity edema   Pulm: Normal respiratory effort Abdomen:  Soft, nondistended, mild LLQ tenderness.  Normal bowel sounds. Watery stool in ostomy bag   Neurologic:  Alert and  oriented x4;  grossly normal neurologically. Psych:  Pleasant, cooperative.  Normal mood and affect.   Intake/Output from previous day: 06/16 0701 - 06/17 0700 In: 2570.1 [P.O.:50; I.V.:2420.2; IV Piggyback:99.9] Out: 2300 [Urine:1825; Stool:475] Intake/Output this shift: No intake/output data recorded.  Lab Results: Recent Labs    02/26/19 0311 02/28/19 0325  WBC 3.5* 3.2*  HGB 7.6* 7.8*  HCT 25.2* 25.5*  PLT 123* 138*   BMET Recent Labs    02/26/19 0311 02/28/19 0325  NA 136 137  K 4.2 4.1  CL 103 102  CO2 25 26  GLUCOSE 165* 157*  BUN  18  °CREATININE 0.31* 0.32*  °CALCIUM 8.3* 8.7*  ° °LFT °Recent Labs  °  02/26/19 °0311  °PROT 5.2*  °ALBUMIN 2.1*  °AST 13*  °ALT 26  °ALKPHOS 65  °BILITOT 0.2*  ° °PT/INR °No results for input(s): LABPROT, INR in the last 72 hours. °Hepatitis Panel °No results for input(s): HEPBSAG, HCVAB, HEPAIGM, HEPBIGM in the last 72 hours. ° °Ct Abdomen Pelvis W Contrast ° °Result Date: 02/27/2019 °CLINICAL DATA:  Multiple fluid collections along the course of the  descending colon consistent with developing abscesses. Fever. Abdominal pain. EXAM: CT ABDOMEN AND PELVIS WITH CONTRAST TECHNIQUE: Multidetector CT imaging of the abdomen and pelvis was performed using the standard protocol following bolus administration of intravenous contrast. CONTRAST:  100mL OMNIPAQUE IOHEXOL 300 MG/ML  SOLN COMPARISON:  02/19/2019 FINDINGS: Lower chest: No acute abnormality. Hepatobiliary: There are few hypoattenuating nodules in the liver which are stable from prior study and are likely of no clinical significance. There is mild gallbladder wall thickening. Pancreas: Unremarkable. No pancreatic ductal dilatation or surrounding inflammatory changes. Spleen: Normal in size without focal abnormality. Adrenals/Urinary Tract: There are nonobstructing stones in the lower pole the right kidney. There is no hydronephrosis. Left kidney is unremarkable. The adrenal glands are unremarkable. The urinary bladder is tethered to the sigmoid colon. Stomach/Bowel: Again identified are fluid collections along the descending colon and sigmoid colon. These fluid collections are stable to slightly decreased in size when compared to prior study. There is diffuse wall thickening of the nearby sigmoid colon and descending colon. The descending colon is tethered to the abdominal wall. In this location the fluid collection has decreased in size. A right lower quadrant ostomy is again noted. There is no evidence of a small-bowel obstruction. Vascular/Lymphatic: Aortic atherosclerosis. No enlarged abdominal or pelvic lymph nodes. Reproductive: Prostate is unremarkable. Other: There are small fat containing bilateral inguinal hernias. There is a small fat containing periumbilical hernia. Musculoskeletal: No acute or significant osseous findings. IMPRESSION: 1. Stable to slightly decreased fluid collection surrounding the sigmoid colon and descending colon. 2. Persistent diffuse wall thickening of virtually all of the  visualized colon consistent with colitis. A more nodular appearance of the sigmoid colon is of unknown clinical significance. Underlying tumor is not entirely excluded and would need to be correlated with colonoscopy. 3. The urinary bladder is tender to the sigmoid colon. There are multiple small bowel loops in the left lower quadrant that are tethered to the sigmoid colon. 4. Interval decrease in size of the fluid collection along the left lateral sidewall. The bowel remains tethered in this location. 5. Unchanged nonobstructing stone in the lower pole of the right kidney. 6. Mild wall thickening of the gallbladder. If there is clinical suspicion for acute cholecystitis, follow-up with ultrasound is recommended. Electronically Signed   By: Christopher  Green M.D.   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) °  Deep vein thrombosis (DVT) of femoral vein of right lower extremity (HCC) °  Diabetes mellitus (HCC) °  Bowel obstruction in setting of lymphoma and sigmoid stricture °  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HCC) °  Hyponatremia °  Malnutrition of moderate degree °  Dehydration °  Palliative care by specialist °  Goals of care, counseling/discussion °  Symptomatic anemia °  Atrophy of muscle of   multiple sites   Depression   Abnormal CT scan, gastrointestinal tract   Thrombocytopenia (HCC)   Abscess of sigmoid colon   Loop ilesotomy for fecal diversion of sigmoid stricture   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

## 2019-02-28 NOTE — Progress Notes (Signed)
PROGRESS NOTE    Mark Frey  OVF:[id number] DOB: 1955/10/29 DOA: 02/07/2019 PCP: [name], MD    Brief Narrative:  63 yo male who presented with weakness. 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 Patient developed worsening weakness for several weeks, tothe point where he was not able to stand by his own. On his initial physical examination his temperature was 99, heart rate 110, respiratory rate 18, blood pressure 104/74, oxygen saturation 100%. His lungs were clear to auscultation bilaterally, heart S1-S2 present rhythmic, abdomen was soft, nontender, no lower extremity edema, patient had weakness on the left lower extremity compared to the right. His affect was flat.  Patient was admitted to the hospital working diagnosis of dehydration complicated by anion gap metabolic acidosis and metabolic encephalopathy.  Patient continued to deteriorate, he was placed on TPN for nutrition. CT of the abdomen and pelvis showed multiple fluid collections along the course of the descending sigmoid colon consistent with developing abscess. Patient was placed on IV antibiotic therapy and surgery was consulted.  June 11 patient underwent percutaneous drainage of abscess collection. Has declined their laparotomy with possible colectomy and ileostomy.  He has been referred to palliative care services.     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)   Deep vein thrombosis (DVT) of femoral vein of right lower extremity (HCC)   Diabetes mellitus (HCC)   Bowel obstruction in setting of lymphoma and sigmoid stricture   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HCC)  Hyponatremia   Malnutrition of moderate degree   Dehydration   Palliative care by specialist   Goals of care, counseling/discussion   Symptomatic anemia   Atrophy of muscle of multiple sites   Depression   Abnormal CT scan, gastrointestinal tract   Thrombocytopenia (HCC)   Abscess of sigmoid colon   Loop ilesotomy for fecal diversion of sigmoid stricture   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 02/22/19 aspiration of anterior and lateral proximal sigmoid collection, 4 ml bloody and turbid fluid, culture positive for pseudomonas and morganella.   Repeat CT of the abdomen with stable to decreased slightly fluid collection at the sigmoid colon and descending colon. Persistent colitis. WBC stable at 3,2. Will continue antibiotic therapy with IV Zosyn and as needed analgesics. Discussed with GI, plan for colonoscopy in am.   2. High grade B cell lymphoma (on remission).  On decadron.  3. T2DM. Fasting glucose 157 mg/dl. Glucose cover and monitoring with insulin sliding scale. No nausea or vomiting, tolerating clear liquid diet, will discontinue TPN.   4. Calorie protein malnutrition. Tolerating clear liquid diet, risk vs benefit will discontinue TPN for now.   5. Right lower extremity dvt. Continue holding on anticoagulation, for colonoscopy in am.   6. Sacrum stage 2 pressure ulcer, present on admission, continue local wound care.   DVT prophylaxis:scd Code Status:dnr Family Communication:no family at the bedside Disposition Plan/ discharge barriers:pending palliative care consultations. Possible hospice.  Body mass index is 22.46 kg/m. Malnutrition Type:  Nutrition Problem: Increased nutrient  needs  Etiology: chronic illness, cancer and cancer related treatments   Malnutrition Characteristics:  Signs/Symptoms: estimated needs   Nutrition Interventions:  Interventions: Magic cup  RN Pressure Injury Documentation: Pressure Injury 01/04/19 Stage II -  Partial thickness loss of dermis presenting as a shallow open ulcer with a red, pink wound bed without slough. redness around wound bed (Active)  01/04/19 0930  Location:   Location Orientation: Left;Right;Medial  Staging: Stage II -  Partial thickness loss of dermis presenting as a shallow open ulcer with a red, pink wound bed without slough.  Wound Description (Comments): redness around wound bed  Present on Admission: Yes     Pressure Injury 02/10/19 Sacrum Mid Stage II -  Partial thickness loss of dermis presenting as a shallow open ulcer with a red, pink wound bed without slough. partial thickness of dermis, first layer of skin off presenting red/pink in color (Active)  02/10/19 1043  Location: Sacrum  Location Orientation: Mid  Staging: Stage II -  Partial thickness loss of dermis presenting as a shallow open ulcer with a red, pink wound bed without slough.  Wound Description (Comments): partial thickness of dermis, first layer of skin off presenting red/pink in color  Present on Admission:      Consultants:     Procedures:     Antimicrobials:   IV Zosyn    Subjective: Patient continue to have pain at the left lower quadrant, dull in nature, no radiation, improved with analgesics, continue to tolerate po well.   Objective: Vitals:   02/27/19 0621 02/27/19 1434 02/27/19 2049 02/28/19 0537  BP: (!) 108/55 97/61 107/68 95/67  Pulse: 71 98 83 88  Resp: 20 19 12 12   Temp: 98.5 F (36.9 C) 98.7 F (37.1 C) 98.5 F (36.9 C) 98.6 F (37 C)  TempSrc: Oral Oral Oral Oral  SpO2: 100% 99% 100% 98%  Weight:      Height:        Intake/Output Summary (Last 24 hours) at 02/28/2019 1255 Last data filed at  02/28/2019 0600 Gross per 24 hour  Intake 2530.24 ml  Output 2300 ml  Net 230.24 ml   Filed Weights   02/08/19 1726 02/22/19 1321  Weight: 69 kg 69 kg    Examination:   General: deconditioned and ill looking appearing Neurology: Awake and alert, non focal  E ENT: mild pallor, no icterus, oral mucosa moist Cardiovascular: No JVD. S1-S2 present, rhythmic, no gallops, rubs, or murmurs. Trace non pitting lower extremity edema. Pulmonary: positive breath sounds bilaterally, adequate air movement, no wheezing, rhonchi or rales. Gastrointestinal. Abdomen mild distended no organomegaly, tender to deep palpation at the left lower quadrant, no rebound or guarding Skin. No rashes Musculoskeletal: no joint deformities     Data Reviewed: I have personally reviewed following labs and imaging studies  CBC: Recent Labs  Lab 02/22/19 0440 02/23/19 0336 02/26/19 0311 02/28/19 0325  WBC 3.5* 3.4* 3.5* 3.2*  NEUTROABS 2.6 2.4 2.5 2.3  HGB 8.2* 8.5* 7.6* 7.8*  HCT 26.8* 27.0* 25.2* 25.5*  MCV 100.0 99.6 101.2* 100.8*  PLT 109* 121* 123* 244*   Basic Metabolic Panel: Recent Labs  Lab 02/22/19 0440 02/23/19 0336 02/25/19 0330 02/26/19 0311 02/28/19 0325  NA 136 136 137 136 137  K 4.0 4.0 4.1 4.2 4.1  CL 102 103 103 103 102  CO2 25 25 28 25 26   GLUCOSE 169* 179* 97 165* 157*  BUN 20 20 19 21 18   CREATININE 0.36* 0.36* 0.32* 0.31*  0.32*  CALCIUM 8.1* 8.3* 8.5* 8.3* 8.7*  MG 1.9 1.9  --  1.7  --   PHOS 2.4* 3.0  --  3.2  --    GFR: Estimated Creatinine Clearance: 93.4 mL/min (A) (by C-G formula based on SCr of 0.32 mg/dL (L)). Liver Function Tests: Recent Labs  Lab 02/22/19 0440 02/26/19 0311  AST 19 13*  ALT 36 26  ALKPHOS 82 65  BILITOT 0.1* 0.2*  PROT 5.1* 5.2*  ALBUMIN 2.0* 2.1*   No results for input(s): LIPASE, AMYLASE in the last 168 hours. No results for input(s): AMMONIA in the last 168 hours. Coagulation Profile: Recent Labs  Lab 02/22/19 0440  INR 1.0    Cardiac Enzymes: No results for input(s): CKTOTAL, CKMB, CKMBINDEX, TROPONINI in the last 168 hours. BNP (last 3 results) No results for input(s): PROBNP in the last 8760 hours. HbA1C: No results for input(s): HGBA1C in the last 72 hours. CBG: Recent Labs  Lab 02/27/19 1300 02/27/19 2047 02/28/19 0256 02/28/19 0736 02/28/19 0804  GLUCAP 131* 189* 160* 69* 86   Lipid Profile: Recent Labs    02/26/19 0311  TRIG 66   Thyroid Function Tests: No results for input(s): TSH, T4TOTAL, FREET4, T3FREE, THYROIDAB in the last 72 hours. Anemia Panel: No results for input(s): VITAMINB12, FOLATE, FERRITIN, TIBC, IRON, RETICCTPCT in the last 72 hours.    Radiology Studies: I have reviewed all of the imaging during this hospital visit personally     Scheduled Meds: . cholestyramine light  4 g Oral 3 times per day  . [START ON 03/01/2019] citalopram  20 mg Oral Daily  . dexamethasone  1 mg Oral Q breakfast  . insulin aspart  0-15 Units Subcutaneous 4 times per day  . multivitamin with minerals  1 tablet Oral Daily  . oxandrolone  5 mg Oral BID  . Short-Chain Fatty Acids Soln (60 mL)  60 mL Per Tube BID  . zinc sulfate  220 mg Oral BID   Continuous Infusions: . sodium chloride Stopped (02/27/19 1430)  . piperacillin-tazobactam 3.375 g (02/28/19 0507)  . TPN CYCLIC-ADULT (ION)       LOS: 20 days        Mauricio Gerome Apley, MD

## 2019-02-28 NOTE — Progress Notes (Signed)
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 Usual Weight: 190 lb - 15% weight loss in last month per RD  Current Nutrition: Full liquid diet ordered- drank Boost x1 yesterday, TPN Insulin: 8 units in previous 24h hours IVF: none  Central access: Port TPN start date: 5/29  ASSESSMENT                                                                                                          HPI: 63 yo male with burkitt's lymphoma s/p chemo now with severe radiation colitis, prolonged malnutrition, multiple recent admissions to start TPN per pharmacy management  Significant events:  6/3 repeat CT abd/pelvis> Descending and sigmoid colitis with fluid collections, not drainable by IR, Zosyn added, made NPO 6/9: apixaban d/c'd for plan for endoscopic eval in next few days. And possibly colonic resection in future per GI notes 6/11: CT guided aspiration of LLQ fluid per IR this AM 6/13: colonoscopy and ileoscopy postponed yesterday, to re-evaluate next week, 6/14: palliative care consulted.  Family mtg planned for 6/16 PM. 6/17: Pt now willing to consider colonoscopy, surgery  Today, 02/28/19  Glucose - goal < 850 while on cyclic TPN (tolerate higher goal CBGs while on cyclic) - 6 Units YDX/41 hours;   CBGs now at goal (157-189).  CBG 1h after TPN off at goal (118).  Note hx DM and chronic dexamethasone 1 mg/day.  Electrolytes - WNL on 6/15  Renal - WNL on 6/15  LFTs - AST/ALT stable/WNL on 6/15  TGs - 71 (6/1), 98 (6/8), 66(6/15)  Prealbumin - 7.7 (6/1), 16 (6/8) oxandrin added 5/29, dose increased 6/1. 21.7 (6/15)  NUTRITIONAL GOALS                                                                                              RD recs: 2300-2500 kcal/day, 115-125g protein/day  Custom TPN at goal of 95 ml/hr over 28NOM or 12 hr cyclic administration getting 2280 ml to provide: 125 g/day protein, 2270Kcal/day.  PLAN  At 1800 today:  Continue custom cyclic TPN with 0131 mls/day over 12 hours = full support  Change to oral multivitamin/mineral due to national shortage.  Standard TPN concentrations of lytes: Na at 125 mEq/L, K at 45, Mag 8, YH:OOIL 1:1 and increased phos at 20  IVF: none  Continue modSSI/CBGs - custom schedule for cyclic TPN  Continue 25 units insulin in TPN  TPN lab panels on Mondays & Thursdays.   Netta Cedars, PharmD, BCPS 02/28/2019 7:17 AM

## 2019-02-28 NOTE — Progress Notes (Signed)
Nutrition Follow-up  RD working remotely.   DOCUMENTATION CODES:   Non-severe (moderate) malnutrition in context of chronic illness  INTERVENTION:  - continue cyclic TPN regimen. - will d/c boost breeze, juven, and prostat supplements. - will order magic cup BID with meals, each supplement provides 290 kcal and 9 grams of protein - continue to encourage PO intakes and advance diet as medically feasible/if medically feasible.    NUTRITION DIAGNOSIS:   Increased nutrient needs related to chronic illness, cancer and cancer related treatments as evidenced by estimated needs. -ongoing  GOAL:   Patient will meet greater than or equal to 90% of their needs -met with cyclic TPN regimen  MONITOR:   PO intake, Supplement acceptance, Diet advancement, Labs, Weight trends, Skin, Other (Comment)(TPN regimen)  ASSESSMENT:   63 y.o. male with medical history significant of Burkitt's lymphoma s/p radiation and chemotherapy, s/p ileostomy, Type 2 DM, radiation colitis, and PE/DVT on Eliquis. He presented to the ED with 3 week history of worsening weakness. At baseline, he is able to ambulate independently with a walker but more recently was unable to stand up on his own. The patient and his wife went to his PCP on 5/26 to discuss decline and to assess capacity. Patient, at that time was considering hospice referral, but has now opted for acute medical treatment.   Patient has not been weighed since 6/11. Diet on 6/10 was FLD and then changed to NPO at midnight on 6/11, CLD at 10:10 AM on 6/11, back to NPO at midnight on 6/12, and to FLD at 11:00 AM on 6/12. Since orders were placed on 6/11, patient has accepted 1 carton of boost breeze, has refused all packets of juven, and has refused all packets of prostat. Patient has mainly been taking bites of items over the past week.   He continues to receive cyclic TPN over 12 hours which is providing 2270 kcal, 125 grams protein to meet estimated nutrition  needs. Slight adjustments made to lytes recently. Question if volume of TPN should be decreased in the near future to aid in stimulating appetite/increasing PO intakes.   Palliative Care is following patient and last saw him and his wife yesterday afternoon. Per notes: on 6/11 he had perc drainage of abscess collection, patient has declined lap with possible colectomy and ileostomy.    Medications reviewed; sliding scale novolog, daily multivitamin with minerals (new 6/15), 60 ml short-chain fatty acids BID, 220 mg zinc sulfate BID.  Labs reviewed; CBGs: 160, 69, and 86 mg/dl today, creatinine: 0.32 mg/dl, Ca: 8.7 mg/dl, triglycerides on 6/15: 66 mg/dl. Iron-related labs checked on 6/8 at which time serum iron WDL and TIBC was slightly low. Question if these should be re-checked in the near future given ongoing zinc supplementation since 5/29.      Diet Order:   Diet Order            Diet full liquid Room service appropriate? Yes; Fluid consistency: Thin  Diet effective now              EDUCATION NEEDS:   Not appropriate for education at this time  Skin:  Skin Assessment: Skin Integrity Issues: Skin Integrity Issues:: Stage II Stage II: sacrum (new: 5/30)  Last BM:  6/17 (200 ml via ileostomy)  Height:   Ht Readings from Last 1 Encounters:  02/08/19 5' 9"  (1.753 m)    Weight:   Wt Readings from Last 1 Encounters:  02/22/19 69 kg    Ideal Body  Weight:  72.7 kg  BMI:  Body mass index is 22.46 kg/m.  Estimated Nutritional Needs:   Kcal:  2300-2500 kcal  Protein:  115-125 grams  Fluid:  >/= 2.2 L/day      Jarome Matin, MS, RD, LDN, [facility] Inpatient Clinical Dietitian Pager # [phone] After hours/weekend pager # [phone]

## 2019-03-01 ENCOUNTER — Inpatient Hospital Stay (HOSPITAL_COMMUNITY): Payer: BC Managed Care – PPO | Admitting: Anesthesiology

## 2019-03-01 ENCOUNTER — Encounter (HOSPITAL_COMMUNITY): Payer: Self-pay | Admitting: Anesthesiology

## 2019-03-01 ENCOUNTER — Encounter (HOSPITAL_COMMUNITY)
Admission: EM | Disposition: A | Discharge status: Skilled Nursing Facility | Payer: Self-pay | Source: Home / Self Care | Attending: Internal Medicine

## 2019-03-01 DIAGNOSIS — D696 Thrombocytopenia, unspecified: Secondary | ICD-10-CM

## 2019-03-01 HISTORY — PX: ILEOSCOPY: SHX5434

## 2019-03-01 HISTORY — PX: BIOPSY: SHX5522

## 2019-03-01 HISTORY — PX: FLEXIBLE SIGMOIDOSCOPY: SHX5431

## 2019-03-01 LAB — BASIC METABOLIC PANEL
Anion gap: 10 (ref 5–15)
BUN: 11 mg/dL (ref 8–23)
CO2: 25 mmol/L (ref 22–32)
Calcium: 8.9 mg/dL (ref 8.9–10.3)
Chloride: 98 mmol/L (ref 98–111)
Creatinine, Ser: 0.41 mg/dL — ABNORMAL LOW (ref 0.61–1.24)
GFR calc Af Amer: >60 mL/min (ref >60)
GFR calc non Af Amer: >60 mL/min (ref >60)
Glucose, Bld: 105 mg/dL — ABNORMAL HIGH (ref 70–99)
Potassium: 3.6 mmol/L (ref 3.5–5.1)
Sodium: 133 mmol/L — ABNORMAL LOW (ref 135–145)

## 2019-03-01 LAB — CBC WITH DIFFERENTIAL/PLATELET
Abs Immature Granulocytes: 0.03 10*3/uL (ref 0.00–0.07)
Basophils Absolute: 0 10*3/uL (ref 0.0–0.1)
Basophils Relative: 1 %
Eosinophils Absolute: 0.1 10*3/uL (ref 0.0–0.5)
Eosinophils Relative: 1 %
HCT: 26.2 % — ABNORMAL LOW (ref 39.0–52.0)
Hemoglobin: 8.1 g/dL — ABNORMAL LOW (ref 13.0–17.0)
Immature Granulocytes: 1 %
Lymphocytes Relative: 9 %
Lymphs Abs: 0.4 10*3/uL — ABNORMAL LOW (ref 0.7–4.0)
MCH: 31.3 pg (ref 26.0–34.0)
MCHC: 30.9 g/dL (ref 30.0–36.0)
MCV: 101.2 fL — ABNORMAL HIGH (ref 80.0–100.0)
Monocytes Absolute: 0.7 10*3/uL (ref 0.1–1.0)
Monocytes Relative: 17 %
Neutro Abs: 3 10*3/uL (ref 1.7–7.7)
Neutrophils Relative %: 71 %
Platelets: 147 10*3/uL — ABNORMAL LOW (ref 150–400)
RBC: 2.59 MIL/uL — ABNORMAL LOW (ref 4.22–5.81)
RDW: 18.6 % — ABNORMAL HIGH (ref 11.5–15.5)
WBC: 4.2 10*3/uL (ref 4.0–10.5)
nRBC: 0 % (ref 0.0–0.2)

## 2019-03-01 LAB — GLUCOSE, CAPILLARY
Glucose-Capillary: 68 mg/dL — ABNORMAL LOW (ref 70–99)
Glucose-Capillary: 119 mg/dL — ABNORMAL HIGH (ref 70–99)
Glucose-Capillary: 70 mg/dL (ref 70–99)
Glucose-Capillary: 69 mg/dL — ABNORMAL LOW (ref 70–99)
Glucose-Capillary: 108 mg/dL — ABNORMAL HIGH (ref 70–99)
Glucose-Capillary: 97 mg/dL (ref 70–99)
Glucose-Capillary: 96 mg/dL (ref 70–99)

## 2019-03-01 SURGERY — BIOPSY
Anesthesia: Monitor Anesthesia Care

## 2019-03-01 MED ORDER — SODIUM CHLORIDE 0.9 % IV SOLN
INTRAVENOUS | Status: DC | PRN
  Administered 2019-03-01: 20 ug/min via INTRAVENOUS

## 2019-03-01 MED ORDER — EPHEDRINE SULFATE 50 MG/ML IJ SOLN
INTRAMUSCULAR | Status: DC | PRN
  Administered 2019-03-01: 5 mg via INTRAVENOUS
  Administered 2019-03-01: 10 mg via INTRAVENOUS

## 2019-03-01 MED ORDER — MIDAZOLAM HCL 2 MG/2ML IJ SOLN
INTRAMUSCULAR | Status: AC
  Filled 2019-03-01: qty 2

## 2019-03-01 MED ORDER — PROPOFOL 500 MG/50ML IV EMUL
INTRAVENOUS | Status: DC | PRN
  Administered 2019-03-01: 125 ug/kg/min via INTRAVENOUS

## 2019-03-01 MED ORDER — FENTANYL CITRATE (PF) 100 MCG/2ML IJ SOLN
INTRAMUSCULAR | Status: AC
  Filled 2019-03-01: qty 2

## 2019-03-01 MED ORDER — FENTANYL CITRATE (PF) 100 MCG/2ML IJ SOLN
INTRAMUSCULAR | Status: DC | PRN
  Administered 2019-03-01: 25 ug via INTRAVENOUS

## 2019-03-01 MED ORDER — INSULIN ASPART 100 UNIT/ML ~~LOC~~ SOLN: 0.0000 [IU] | Freq: Three times a day (TID) | SUBCUTANEOUS | Status: DC

## 2019-03-01 MED ORDER — LACTATED RINGERS IV SOLN
INTRAVENOUS | Status: DC | PRN
  Administered 2019-03-01: 08:00:00 via INTRAVENOUS

## 2019-03-01 MED ORDER — MIDAZOLAM HCL 5 MG/5ML IJ SOLN
INTRAMUSCULAR | Status: DC | PRN
  Administered 2019-03-01: 2 mg via INTRAVENOUS

## 2019-03-01 MED ORDER — DEXTROSE 50 % IV SOLN
12.5000 g | INTRAVENOUS | Status: AC
  Administered 2019-03-01: 03:00:00 12.5 g via INTRAVENOUS
  Filled 2019-03-01: qty 50

## 2019-03-01 MED ORDER — PROPOFOL 500 MG/50ML IV EMUL
INTRAVENOUS | Status: DC | PRN
  Administered 2019-03-01: 20 mg via INTRAVENOUS

## 2019-03-01 NOTE — Interval H&P Note (Signed)
History and Physical Interval Note:  03/01/2019 8:37 AM  Mark Frey  has presented today for surgery, with the diagnosis of colitis.  The various methods of treatment have been discussed with the patient and family. After consideration of risks, benefits and other options for treatment, the patient has consented to  Procedure(s): COLONOSCOPY (N/A) as a surgical intervention.  The patient's history has been reviewed, patient examined, no change in status, stable for surgery.  I have reviewed the patient's chart and labs.  Questions were answered to the patient's satisfaction.     Pricilla Riffle. [place] Plan

## 2019-03-01 NOTE — Progress Notes (Addendum)
HEMATOLOGY-ONCOLOGY PROGRESS NOTE  SUBJECTIVE: The patient was more conversant this morning.  Had a colonoscopy performed earlier today and tolerated the procedure well.  Still has mild left lower quadrant pain.  Has soft brown stool from his ostomy.  Colonoscopy showed severe sigmoid colon stricture and severe colitis in the sigmoid colon which was biopsied.  He had erythematous mucosa in the rectum which was biopsied, mild diverticulosis in the sigmoid colon without evidence of diverticular bleeding, and small internal hemorrhoids. He has no other complaints this morning.  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  Based on the IHC studies, the differential diagnosis includes diffuse large B-cell lymphoma, Burkitt lymphoma as well as but can like lymphoma.    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  Biopsy confirmed high-grade B-cell lymphoma.   03/17/2018 -  Chemotherapy   R-EPOCH every 3 weeks   03/17/2018 Procedure   Bone marrow biopsy was negative for lymphoma involvement.   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 He received intrathecal methotrexate.   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       REVIEW OF SYSTEMS:   A comprehensive 14 point review of systems was negative except as noted in the HPI.  I have reviewed the past medical history, past surgical history, social history and family history with the patient and they are unchanged from previous note.   PHYSICAL EXAMINATION:  Vitals:   03/01/19 0940 03/01/19 1005  BP: (!) 100/58 102/65  Pulse: 87 80  Resp: 14 14  Temp:  98.3 F (36.8 C)  SpO2: 100% 98%   Filed Weights   02/08/19 1726 02/22/19 1321 03/01/19 0802  Weight: 152 lb 1.9 oz (69 kg) 152 lb 1.9 oz (69 kg) 152 lb 1.9 oz (69 kg)    Intake/Output from previous day: 06/17 0701 - 06/18 0700 In: 328.9 [P.O.:240; IV Piggyback:88.9] Out: 1350 [Urine:850; Stool:500]  GENERAL:alert, no distress and comfortable SKIN: No rashes or significant lesions. EYES: normal, Conjunctiva are pink and non-injected, sclera clear OROPHARYNX:no exudate, no erythema and lips, buccal mucosa, and tongue normal  NECK: supple, thyroid normal size, non-tender, without nodularity LYMPH:  no palpable lymphadenopathy in the cervical, axillary or  inguinal LUNGS: clear to auscultation and percussion with normal breathing effort HEART: regular rate & rhythm and no murmurs and no lower extremity edema. ABDOMEN: Soft, mild tenderness to the LLQ with light palpation.  Liquid stool noted in the ostomy.  Positive bowel sounds. Musculoskeletal:no cyanosis of digits and no clubbing  NEURO: alert &  oriented x 3,no focal motor/sensory deficits.  Flat affect.  LABORATORY DATA:  I have reviewed the data as listed CMP Latest Ref Rng & Units 03/01/2019 02/28/2019 02/26/2019  Glucose 70 - 99 mg/dL 105(H) 157(H) 165(H)  BUN 8 - 23 mg/dL 11 18 21   Creatinine 0.61 - 1.24 mg/dL 0.41(L) 0.32(L) 0.31(L)  Sodium 135 - 145 mmol/L 133(L) 137 136  Potassium 3.5 - 5.1 mmol/L 3.6 4.1 4.2  Chloride 98 - 111 mmol/L 98 102 103  CO2 22 - 32 mmol/L 25 26 25   Calcium 8.9 - 10.3 mg/dL 8.9 8.7(L) 8.3(L)  Total Protein 6.5 - 8.1 g/dL - - 5.2(L)  Total Bilirubin 0.3 - 1.2 mg/dL - - 0.2(L)  Alkaline Phos 38 - 126 U/L - - 65  AST 15 - 41 U/L - - 13(L)  ALT 0 - 44 U/L - - 26    Lab Results  Component Value Date   WBC 4.2 03/01/2019   HGB 8.1 (L) 03/01/2019   HCT 26.2 (L) 03/01/2019   MCV 101.2 (H) 03/01/2019   PLT 147 (L) 03/01/2019   NEUTROABS 3.0 03/01/2019    Ct Abdomen Pelvis W Contrast  Result Date: 02/27/2019 CLINICAL DATA:  Multiple fluid collections along the course of the descending colon consistent with developing abscesses. Fever. Abdominal pain. EXAM: CT ABDOMEN AND PELVIS WITH CONTRAST TECHNIQUE: Multidetector CT imaging of the abdomen and pelvis was performed using the standard protocol following bolus administration of intravenous contrast. CONTRAST:  164m OMNIPAQUE IOHEXOL 300 MG/ML  SOLN COMPARISON:  02/19/2019 FINDINGS: Lower chest: No acute abnormality. Hepatobiliary: There are few hypoattenuating nodules in the liver which are stable from prior study and are likely of no clinical significance. There is mild gallbladder wall thickening. Pancreas: Unremarkable. No pancreatic ductal dilatation or surrounding inflammatory changes. Spleen: Normal in size without focal abnormality. Adrenals/Urinary Tract: There are nonobstructing stones in the lower pole the right kidney. There is no hydronephrosis. Left kidney is unremarkable. The adrenal glands are unremarkable. The urinary bladder is tethered  to the sigmoid colon. Stomach/Bowel: Again identified are fluid collections along the descending colon and sigmoid colon. These fluid collections are stable to slightly decreased in size when compared to prior study. There is diffuse wall thickening of the nearby sigmoid colon and descending colon. The descending colon is tethered to the abdominal wall. In this location the fluid collection has decreased in size. A right lower quadrant ostomy is again noted. There is no evidence of a small-bowel obstruction. Vascular/Lymphatic: Aortic atherosclerosis. No enlarged abdominal or pelvic lymph nodes. Reproductive: Prostate is unremarkable. Other: There are small fat containing bilateral inguinal hernias. There is a small fat containing periumbilical hernia. Musculoskeletal: No acute or significant osseous findings. IMPRESSION: 1. Stable to slightly decreased fluid collection surrounding the sigmoid colon and descending colon. 2. Persistent diffuse wall thickening of virtually all of the visualized colon consistent with colitis. A more nodular appearance of the sigmoid colon is of unknown clinical significance. Underlying tumor is not entirely excluded and would need to be correlated with colonoscopy. 3. The urinary bladder is tender to the sigmoid colon. There are multiple small bowel loops in the left lower quadrant  that are tethered to the sigmoid colon. 4. Interval decrease in size of the fluid collection along the left lateral sidewall. The bowel remains tethered in this location. 5. Unchanged nonobstructing stone in the lower pole of the right kidney. 6. Mild wall thickening of the gallbladder. If there is clinical suspicion for acute cholecystitis, follow-up with ultrasound is recommended. Electronically Signed   By: Constance Holster M.D.   On: 02/27/2019 19:51   Ct Abdomen Pelvis W Contrast  Result Date: 02/19/2019 CLINICAL DATA:  Burkitt's lymphoma. Failure to thrive. Fluid collections along the colon. Post  bowel resection EXAM: CT ABDOMEN AND PELVIS WITH CONTRAST TECHNIQUE: Multidetector CT imaging of the abdomen and pelvis was performed using the standard protocol following bolus administration of intravenous contrast. CONTRAST:  116m OMNIPAQUE IOHEXOL 300 MG/ML  SOLN COMPARISON:  CT 02/14/2019 FINDINGS: Lower chest: No pneumonia at the lung bases. Hepatobiliary: No focal hepatic lesion. No hepatic abscess. Gallbladder normal. Pancreas: Pancreas is normal. No ductal dilatation. No pancreatic inflammation. Spleen: Normal spleen Adrenals/urinary tract: Adrenal glands normal. Small hypodense lesions in the kidney likely represent benign cysts no evidence of pyelonephritis. No hydronephrosis bladder normal Stomach/Bowel: Stomach, duodenum normal. Loop ileostomy in the mid RIGHT abdomen. Towards the terminal ileum the last 6 cm of small bowel demonstrate mucosal enhancement and fluid within lumen. The ascending colon demonstrates thickened nodular mucosa. There is no fluid collection surrounding the ascending colon. The transverse colon is mildly thick-walled with enhancement. The descending colon does demonstrate a pericolonic fluid collection beneath the serosal surface. There is adherence of the descending colon to the lateral abdominal wall in the mid descending colon (image 41/2). This is contiguous with the fluid collection surrounding the descending colon and proximal sigmoid colon which may communicate with the distal ileum at several locations (image 62/5 and image 89/5. Rectum normal. The fluid collection extending along the descending colon is similar to comparison exam. The fluid collection at the junction of the sigmoid colon and small bowel is decreased in volume. There is no clear evidence of extraluminal abscess. No evidence of perforation. Vascular/Lymphatic: Abdominal aorta is normal caliber. No periportal or retroperitoneal adenopathy. No pelvic adenopathy. Reproductive: Prostate normal Other: No free  fluid. Musculoskeletal: No aggressive osseous lesion. IMPRESSION: 1. Persistent mucosal thickening and enhancement involving the entire colon consistent with pancolitis. 2. Fluid within the bowel wall of the descending colon appears to communicate with the distal ileum ileum at the level the sigmoid colon. The fluid collection is less prominent at the level of the sigmoid colon compared to exam 5 days prior. 3. Serosal surface of the descending colon is adherent to the lateral abdominal wall peritoneal surface. 4. No intraperitoneal free air.  No extraluminal abscess. Electronically Signed   By: SSuzy BouchardM.D.   On: 02/19/2019 15:56   Ct Abdomen Pelvis W Contrast  Result Date: 02/14/2019 CLINICAL DATA:  Left-sided abdominal pain for several days EXAM: CT ABDOMEN AND PELVIS WITH CONTRAST TECHNIQUE: Multidetector CT imaging of the abdomen and pelvis was performed using the standard protocol following bolus administration of intravenous contrast. CONTRAST:  1064mOMNIPAQUE 300 COMPARISON:  01/01/2019 FINDINGS: Lower chest: Mild basilar atelectasis is noted. Hepatobiliary: Stable in appearance from the recent exam. Pancreas: Unremarkable. No pancreatic ductal dilatation or surrounding inflammatory changes. Spleen: Normal in size without focal abnormality. Adrenals/Urinary Tract: Adrenal glands are within normal limits. Scattered small cysts are noted bilaterally. A nonobstructing right renal stone is seen. The ureters are within normal limits. The bladder is partially  compressed. Stomach/Bowel: The colon again demonstrates diffuse wall thickening consistent with colitis. There is a small fluid collection identified adjacent to the mid descending colon which measures approximately 2 x 1.5 cm in greatest dimension. A slightly larger fluid collection is noted surrounding sigmoid colon and extending towards the cecum in the right hemipelvis. This lies in an area of previously seen air-filled pockets likely  representing a confluent abscess. This measures at least 8 cm in greatest transverse dimension but measures only approximately 2.6 cm in AP dimension. More proximal small bowel is within normal limits. Loop ileostomy is noted in the right mid abdomen. Vascular/Lymphatic: Aortic atherosclerosis. No enlarged abdominal or pelvic lymph nodes. Reproductive: Prostate is unremarkable. Other: No abdominal wall hernia or abnormality. No abdominopelvic ascites. Musculoskeletal: No acute or significant osseous findings. IMPRESSION: Multifocal fluid collections along the course of the descending and sigmoid colon as described consistent with developing abscesses. The largest of these lies along the margin of the sigmoid colon as described above. These changes are new from prior exam. Nonobstructing right renal stone stable from the prior exam. These results will be called to the ordering clinician or representative by the Radiologist Assistant, and communication documented in the PACS or zVision Dashboard. Electronically Signed   By: Inez Catalina M.D.   On: 02/14/2019 12:48   Ct Aspiration  Result Date: 02/22/2019 INDICATION: Pancolitis and history of high-grade lymphoma. Slowly improving pericolonic fluid collections and request for aspiration of one of the collections for diagnostic purposes as an infectious source has not been able to be isolated by other means. EXAM: CT-GUIDED ASPIRATION OF LEFT LOWER QUADRANT PERITONEAL FLUID COLLECTION MEDICATIONS: The patient is currently admitted to the hospital and receiving intravenous antibiotics. The antibiotics were administered within an appropriate time frame prior to the initiation of the procedure. ANESTHESIA/SEDATION: Fentanyl 100 mcg IV; Versed 2.0 mg IV Moderate Sedation Time:  15 minutes. The patient was continuously monitored during the procedure by the interventional radiology nurse under my direct supervision. COMPLICATIONS: None immediate. PROCEDURE: Informed  written consent was obtained from the patient after a thorough discussion of the procedural risks, benefits and alternatives. All questions were addressed. Maximal Sterile Barrier Technique was utilized including caps, mask, sterile gowns, sterile gloves, sterile drape, hand hygiene and skin antiseptic. A timeout was performed prior to the initiation of the procedure. CT was performed through the abdomen and pelvis in a supine position. After choosing a site for aspiration, the left lower abdominal wall was prepped with chlorhexidine and local anesthesia was provided with 1% lidocaine. An 18 gauge trocar needle was advanced under CT guidance to the level of a pericolonic fluid collection anterior and lateral to the proximal sigmoid colon. Aspiration was performed. A fluid sample was obtained and sent for culture analysis including a aerobic/anaerobic culture and fungal culture. FINDINGS: The tiny collection lateral to the descending colon appears smaller and was felt to be too small to obtain any diagnostic fluid from. There is some persistent fluid anterior and lateral to the proximal sigmoid colon that was targeted. There was return of 4 mL of bloody and turbid fluid from this area. IMPRESSION: CT-guided aspiration of pericolonic fluid adjacent to the sigmoid colon yielding 4 mL of bloody and turbid fluid. The fluid sample was sent for culture analysis. Electronically Signed   By: Aletta Edouard M.D.   On: 02/22/2019 10:58   [place] Ekg Site Rite  Result Date: 02/19/2019 If Site Rite image not attached, placement could not be confirmed due to  current cardiac rhythm.   ASSESSMENT AND PLAN: 1. 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  3. H/o Small bowel obstruction/ileuswith sigmoid thickening/stenosis . 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 EGreer Pickerel 4. 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 GI panel and C. difficile negative.  CMV IgM negative, EBV IgM negative. 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 Sed rate and CRP still elevated  5.Hypovolemic hyponatremia. Other electrolyte issues including hypokalemia and hypomagnesemia  6. Pancytopenia -  Anemia likely multifactorial but could be from GI bleeding plus significant inflammation from severe radiation colitis. Leukopenia/lymphopenia likely from radiation toxicity. Thrombocytopenia from consumption related to GI bleeding and from radiation toxicity.- nearly normalized. B12 within normal limits Copper levels within normal limits at 132 Zinc deficiency noted- 44 rpt this admission 60 Viral work-upnot revealing Low likelihood of Burkitt's lymphoma progression at this time.  7. Admitted with failure to thrive, dehydration electrolyte abnormalities and hyperglycemia with poor p.o. intake. 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  8. Depression - discussed with patient. Continue Celexa.  9. Symptomatic anemia -transfuse prn to maintain hgb>8 given symptoms and some GI losses.  10. Moderate-severe protein calorie malnutrition and muscle waste  11. Hypogonadism - secondary. Very low testosterone levels.  PLAN -Labs reviewed with the patient.  Anticipate some blood count changes due to hemodilution from TPN. WBC stable. -Recommend PRBC transfusion for hemoglobin less than 8.0 due to ongoing GI blood losses.  Hemoglobin remains stable.    -optimize nutrition -Continue Oxandrolone 54mpo BID -Eliquis on hold due to procedures.  -Appreciate excellent hospitalist care. -Monitor electrolytes phosphorus, potassium and magnesium replacement -continue Zinc replacement -continue TPN -optimize DM2 management- might need addition of basal insulin. -may need to consider ESA  for anemia or chronic disease -PT/OT evaluation, out of bed to chair. -Abdominal abscesses noted on CT scan. S/P CT guided aspiration of the peritoneal fluid.  Culture showed rare pseudomonas aeruginosa and rare Morganella morganii. On TPN. Continue IV antibiotics.  Status post colonoscopy earlier today which showed a severe stricture in the sigmoid colon and severe colitis in the sigmoid colon.  Biopsies are pending. -High ileostomy output which is improving. GI following. Stool for C diff negative. GI panel negative.  Also consider talking with radiation oncology regarding any additional intervention recommended for treatment of radiation colitis. -Palliative care following for ongoing discussion of goals of care. -Patient will D/C to SNF when medically stable.    LOS: 21 days   KrMikey BussingDNP, AGPCNP-BC, AOCNP 03/01/19  ADDENDUM  .Patient was Personally and independently interviewed, examined and relevant elements of the history of present illness were reviewed in details and an assessment and plan was created. All elements of the patient's history of present illness , assessment and plan were discussed in details with KrMikey BussingDNP. The above documentation reflects our combined findings assessment and plan.  Will await biopsy from his colonoscopy -- we are curious to see if this is all persistent radiation colitis or if there is a possibility of underlying Inflammatory bowel disease since the patient did have matted bowels even on initial presentation of Burkitts and a long history or unevaluated "IBS" prior to that. GaSullivan LoneD MS

## 2019-03-01 NOTE — Progress Notes (Signed)
OT Cancellation Note  Patient Details Name: Mark Frey MRN: [id number] DOB: 01-15-1956   Cancelled Treatment:    Reason Eval/Treat Not Completed: Patient at procedure or test/ unavailable  Zyana Amaro 03/01/2019, 9:05 AM  Lesle Chris, OTR/L Acute Rehabilitation Services [phone] [facility] pager [phone] office 03/01/2019

## 2019-03-01 NOTE — Progress Notes (Signed)
Notified the PCP that the Blood glucose/Insulin administration was @ 2000,0200,0700. Requested to have blood Glucose orders changed if appropriate. Awaiting any new orders.

## 2019-03-01 NOTE — Op Note (Signed)
[facility] Patient Name: Mark Frey Procedure Date: 03/01/2019 MRN: [id number] Attending MD: Ladene Artist , MD Date of Birth: 07-Mar-1956 CSN: [id number] Age: 63 Admit Type: Inpatient Procedure:                Colonoscopy, incompelete, with biopsies and                            ileoscopy with biopsies Indications:               Providers:                Pricilla Riffle. Fuller Plan, MD, Carmie End, RN, Cherylynn Ridges, Technician, Arnoldo Hooker, CRNA Referring MD:             [facility] Medicines:                Monitored Anesthesia Care Complications:            No immediate complications. Estimated Blood Loss:     Estimated blood loss was minimal. Procedure:                Pre-Anesthesia Assessment:                           - Prior to the procedure, a History and Physical                            was performed, and patient medications and                            allergies were reviewed. The patient's tolerance of                            previous anesthesia was also reviewed. The risks                            and benefits of the procedure and the sedation                            options and risks were discussed with the patient.                            All questions were answered, and informed consent                            was obtained. Prior Anticoagulants: The patient has                            taken no previous anticoagulant or antiplatelet                            agents. ASA Grade Assessment: III - A patient with  severe systemic disease. After reviewing the risks                            and benefits, the patient was deemed in                            satisfactory condition to undergo the procedure.                           After obtaining informed consent, the colonoscope                            was passed under direct vision. Throughout the   procedure, the patient's blood pressure, pulse, and                            oxygen saturations were monitored continuously. The                            PCF-H190DL ([id number] Olympus pediatric colonscope                            was introduced through loop ileostomy and both                            segments were examined. Then the pediatric                            colonoscope was introduced through the anus with                            the intention of advancing to the cecum. The scope                            was advanced to the sigmoid colon before the                            procedure was aborted. Medications were not given.                            The colonoscopy was technically difficult and                            complex due to severe sigmoid colon stenosis and                            colitis. The patient tolerated the procedure well.                            The quality of the bowel preparation was good. Scope In: 9:01:59 AM Scope Out: 9:09:27 AM Scope Withdrawal Time: 0 hours 7 minutes 7 seconds  Total Procedure Duration: 0 hours 7 minutes 28 seconds  Findings:      The perianal and digital rectal examinations were normal.  A benign-appearing, intrinsic severe stenosis associated with severe       colitis measuring at least 15 cm (in length) x 6 mm (inner diameter) at       the narrowest point was found in the sigmoid colon and was       non-traversed. The stricture was likely longer as I could not traverse       beyond the stricture. Stricture and severe colitis extended at least       from 15 cm to 30 cm. Biopsies were taken with a cold forceps for       histology.      A patchy area of mildly erythematous mucosa was found in the rectum. The       rectum appeared relatively normal compared to the sigmoid colon.       Biopsies were taken with a cold forceps for histology.      Multiple small-mouthed diverticula were found in the sigmoid colon.        There was no evidence of diverticular bleeding.      Internal hemorrhoids were found during retroflexion. The hemorrhoids       were small and Grade I (internal hemorrhoids that do not prolapse).      A diffuse area of mucosa in one segment of the loop ileostomy was       moderately congested, erythematous and inflamed. The other segment of       the loop ileostomy appeared normal. Both segment entered about 15 cm.       Biopsies were taken with a cold forceps for histology from both segments. Impression:               - Colonoscopy aborted at severe sigmoid colon                            stricture.                           - Severe stricture and severe colitis in the                            sigmoid colon. Biopsied.                           - Erythematous mucosa in the rectum. Biopsied.                           - Mild diverticulosis in the sigmoid colon. There                            was no evidence of diverticular bleeding.                           - Small internal hemorrhoids.                           - Congested, erythematous and diffusely inflamed                            mucosa noted in one segment of the loop ileostomy.  The other segment of the loop ileostomy appeared                            normal. Both segments were biopsied. Moderate Sedation:      Not Applicable - Patient had care per Anesthesia. Recommendation:           - Return patient to hospital ward for ongoing care                            and possible surgery.                           - Resume previous diet.                           - Continue present medications.                           - Await pathology results. Procedure Code(s):        --- Professional ---                           [phone], 65, Colonoscopy, flexible; with biopsy,                            single or multiple Diagnosis Code(s):        --- Professional ---                           [phone],  Other intestinal obstruction unspecified                            as to partial versus complete obstruction                           K62.89, Other specified diseases of anus and rectum                           K64.0, First degree hemorrhoids                           K63.89, Other specified diseases of intestine                           K52.9, Noninfective gastroenteritis and colitis,                            unspecified                           K57.30, Diverticulosis of large intestine without                            perforation or abscess without bleeding CPT copyright 2019 American Medical Association. All rights reserved. The codes documented in this report are preliminary and upon coder review may  be revised to meet current compliance requirements. Ladene Artist, MD 03/01/2019  9:35:13 AM This report has been signed electronically. Number of Addenda: 0

## 2019-03-01 NOTE — Anesthesia Postprocedure Evaluation (Signed)
Anesthesia Post Note  Patient: Mark Frey  Procedure(s) Performed: BIOPSY ILEOSCOPY THROUGH STOMA (N/A ) FLEXIBLE SIGMOIDOSCOPY (N/A )     Patient location during evaluation: PACU Anesthesia Type: MAC Level of consciousness: awake and alert Pain management: pain level controlled Vital Signs Assessment: post-procedure vital signs reviewed and stable Respiratory status: spontaneous breathing, nonlabored ventilation, respiratory function stable and patient connected to nasal cannula oxygen Cardiovascular status: stable and blood pressure returned to baseline Postop Assessment: no apparent nausea or vomiting Anesthetic complications: no    Last Vitals:  Vitals:   03/01/19 0940 03/01/19 1005  BP: (!) 100/58 102/65  Pulse: 87 80  Resp: 14 14  Temp:  36.8 C  SpO2: 100% 98%    Last Pain:  Vitals:   03/01/19 1005  TempSrc: Oral  PainSc:                  Raygen Linquist S

## 2019-03-01 NOTE — Progress Notes (Signed)
PROGRESS NOTE    Mark Frey  KKX:[id number] DOB: 21-Jun-1956 DOA: 02/07/2019 PCP: [name], MD    Brief Narrative:  62 yo male who presented with weakness. 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 Patient developed worsening weakness for several weeks,tothe point where he was not able to stand by his own. On his initial physical examination his temperature was 99, heart rate 110, respiratory rate 18, blood pressure 104/74, oxygen saturation 100%. His lungswereclear to auscultation bilaterally, heart S1-S2 present rhythmic, abdomen was soft, nontender, no lower extremity edema, patient had weakness on the left lower extremity compared to the right. His affect was flat.  Patient was admitted to the hospital working diagnosis of dehydration complicated by anion gap metabolic acidosis and metabolic encephalopathy.  Patient continued to deteriorate, he was placed on TPN for nutrition. CT of the abdomen and pelvis showed multiple fluid collections along the course of the descending sigmoid colon consistent with developing abscess. Patient was placed on IV antibiotic therapy and surgery was consulted.  June 11 patient underwent percutaneous drainage of abscess collection. Has declined their laparotomy with possible colectomy and ileostomy.  June 18, Underwent colonoscopy finding severe colitis, biopsies performed.     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)   Deep vein thrombosis (DVT) of femoral vein of right lower extremity (HCC)   Diabetes mellitus (HCC)   Bowel obstruction in setting of lymphoma and sigmoid stricture   Stricture of sigmoid  colon (HCC)   Hyponatremia   Malnutrition of moderate degree   Dehydration   Palliative care by specialist   Goals of care, counseling/discussion   Symptomatic anemia   Atrophy of muscle of multiple sites   Depression   Abnormal CT scan, gastrointestinal tract   Thrombocytopenia (HCC)   Abscess of sigmoid colon   Loop ilesotomy for fecal diversion of sigmoid stricture   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 02/22/19 aspiration of anterior and lateral proximal sigmoid collection, 4 ml bloody and turbid fluid, culture positive for pseudomonas and morganella.   Patient continue to have abdominal pain, but tolerating full liquids well. Underwent colonoscopy finding severe colitis, follow on biopsy. Continue antibiotic therapy and supportive medical therapy. Continue pain control with hydromorphone.    2. High grade B cell lymphoma (on remission). Continue with decadron.  3. T2DM.Fasting glucose 105 mg/dl. Continue with glucose cover and monitoring with insulin sliding scale. Advance diet to full liquids.   4. Calorie protein malnutrition.Continue nutritional supplementation.   5. Right lower extremity dvt.Severe colitis on colonoscopy, will continue to hold on anticoagulation for now due to risk of bleeding.   6. Sacrum stage 2 pressure ulcer, present on admission, continue local wound care per wound care team.    DVT prophylaxis:scd Code Status:dnr Family Communication:no family at the bedside Disposition Plan/ discharge barriers:pending clinical improvement, colonic biopsy.  Body mass index is 22.46 kg/m. Malnutrition Type:  Nutrition Problem: Increased nutrient needs Etiology: chronic illness,  cancer and cancer related treatments   Malnutrition Characteristics:  Signs/Symptoms: estimated needs   Nutrition Interventions:  Interventions: Magic cup  RN Pressure Injury Documentation: Pressure Injury 01/04/19 Stage II -  Partial thickness loss of dermis presenting as a shallow open ulcer with a red, pink wound bed without slough. redness around wound bed (Active)  01/04/19 0930  Location:   Location Orientation: Left;Right;Medial  Staging: Stage II -  Partial thickness loss of dermis presenting as a shallow open ulcer with a red, pink wound bed without slough.  Wound Description (Comments): redness around wound bed  Present on Admission: Yes     Pressure Injury 02/10/19 Sacrum Mid Stage II -  Partial thickness loss of dermis presenting as a shallow open ulcer with a red, pink wound bed without slough. partial thickness of dermis, first layer of skin off presenting red/pink in color (Active)  02/10/19 1043  Location: Sacrum  Location Orientation: Mid  Staging: Stage II -  Partial thickness loss of dermis presenting as a shallow open ulcer with a red, pink wound bed without slough.  Wound Description (Comments): partial thickness of dermis, first layer of skin off presenting red/pink in color  Present on Admission:      Consultants:   GI   Surgery   Palliative   Procedures:   Colonoscopy   Antimicrobials:   IV Zosyn     Subjective: Patient continue to have abdominal pain at the left lower quadrant stable intensity with no radiation, no associated nausea or vomiting, dull in nature.   Objective: Vitals:   03/01/19 0925 03/01/19 0930 03/01/19 0940 03/01/19 1005  BP: (!) 90/43 104/66 (!) 100/58 102/65  Pulse: (!) 101 87 87 80  Resp: 15 14 14 14   Temp:    98.3 F (36.8 C)  TempSrc:    Oral  SpO2: 100% 100% 100% 98%  Weight:      Height:        Intake/Output Summary (Last 24 hours) at 03/01/2019 1016 Last data filed at 03/01/2019 0902 Gross per 24 hour   Intake 589.91 ml  Output 1350 ml  Net -760.09 ml   Filed Weights   02/08/19 1726 02/22/19 1321 03/01/19 0802  Weight: 69 kg 69 kg 69 kg    Examination:   General: deconditioned  Neurology: Awake and alert, non focal  E ENT: mid pallor, no icterus, oral mucosa moist Cardiovascular: No JVD. S1-S2 present, rhythmic, no gallops, rubs, or murmurs. No lower extremity edema. Pulmonary: vesicular breath sounds bilaterally, adequate air movement, no wheezing, rhonchi or rales. Gastrointestinal. Abdomen mild distention no organomegaly, left lower quadrant tender to deep palpation, no rebound or guarding Skin. No rashes Musculoskeletal: no joint deformities     Data Reviewed: I have personally reviewed following labs and imaging studies  CBC: Recent Labs  Lab 02/23/19 0336 02/26/19 0311 02/28/19 0325 03/01/19 0333  WBC 3.4* 3.5* 3.2* 4.2  NEUTROABS 2.4 2.5 2.3 3.0  HGB 8.5* 7.6* 7.8* 8.1*  HCT 27.0* 25.2* 25.5* 26.2*  MCV 99.6 101.2* 100.8* 101.2*  PLT 121* 123* 138* 732*   Basic Metabolic Panel: Recent Labs  Lab 02/23/19 0336 02/25/19 0330 02/26/19 0311 02/28/19 0325 03/01/19 0333  NA 136 137 136 137 133*  K 4.0 4.1 4.2 4.1 3.6  CL 103 103 103 102 98  CO2 25 28 25 26 25   GLUCOSE 179* 97 165* 157* 105*  BUN 20 19 21 18 11   CREATININE 0.36* 0.32* 0.31* 0.32* 0.41*  CALCIUM 8.3* 8.5* 8.3* 8.7* 8.9  MG 1.9  --  1.7  --   --  PHOS 3.0  --  3.2  --   --    GFR: Estimated Creatinine Clearance: 93.4 mL/min (A) (by C-G formula based on SCr of 0.41 mg/dL (L)). Liver Function Tests: Recent Labs  Lab 02/26/19 0311  AST 13*  ALT 26  ALKPHOS 65  BILITOT 0.2*  PROT 5.2*  ALBUMIN 2.1*   No results for input(s): LIPASE, AMYLASE in the last 168 hours. No results for input(s): AMMONIA in the last 168 hours. Coagulation Profile: No results for input(s): INR, PROTIME in the last 168 hours. Cardiac Enzymes: No results for input(s): CKTOTAL, CKMB, CKMBINDEX, TROPONINI in  the last 168 hours. BNP (last 3 results) No results for input(s): PROBNP in the last 8760 hours. HbA1C: No results for input(s): HGBA1C in the last 72 hours. CBG: Recent Labs  Lab 02/28/19 1640 02/28/19 2020 03/01/19 0203 03/01/19 0309 03/01/19 0658  GLUCAP 90 71 68* 119* 70   Lipid Profile: No results for input(s): CHOL, HDL, LDLCALC, TRIG, CHOLHDL, LDLDIRECT in the last 72 hours. Thyroid Function Tests: No results for input(s): TSH, T4TOTAL, FREET4, T3FREE, THYROIDAB in the last 72 hours. Anemia Panel: No results for input(s): VITAMINB12, FOLATE, FERRITIN, TIBC, IRON, RETICCTPCT in the last 72 hours.    Radiology Studies: I have reviewed all of the imaging during this hospital visit personally     Scheduled Meds: . cholestyramine light  4 g Oral 3 times per day  . citalopram  20 mg Oral Daily  . dexamethasone  1 mg Oral Q breakfast  . insulin aspart  0-15 Units Subcutaneous 4 times per day  . multivitamin with minerals  1 tablet Oral Daily  . oxandrolone  5 mg Oral BID  . zinc sulfate  220 mg Oral BID   Continuous Infusions: . sodium chloride Stopped (02/27/19 1430)  . piperacillin-tazobactam 3.375 g (03/01/19 0548)     LOS: 21 days         Gerome Apley, MD

## 2019-03-01 NOTE — Anesthesia Preprocedure Evaluation (Signed)
Anesthesia Evaluation  Patient identified by MRN, date of birth, ID band Patient awake    Reviewed: Allergy & Precautions, NPO status , Patient's Chart, lab work & pertinent test results  Airway Mallampati: II  TM Distance: >3 FB Neck ROM: Full    Dental no notable dental hx.    Pulmonary neg pulmonary ROS,    Pulmonary exam normal breath sounds clear to auscultation       Cardiovascular hypertension, + DVT  Normal cardiovascular exam Rhythm:Regular Rate:Normal     Neuro/Psych negative neurological ROS  negative psych ROS   GI/Hepatic negative GI ROS, Neg liver ROS,   Endo/Other  diabetes  Renal/GU negative Renal ROS  negative genitourinary   Musculoskeletal negative musculoskeletal ROS (+)   Abdominal   Peds negative pediatric ROS (+)  Hematology  (+) Blood dyscrasia, anemia , burkitts lymphoma   Anesthesia Other Findings   Reproductive/Obstetrics negative OB ROS                             Anesthesia Physical Anesthesia Plan  ASA: IV  Anesthesia Plan: MAC   Post-op Pain Management:    Induction: Intravenous  PONV Risk Score and Plan: 1 and Ondansetron  Airway Management Planned: Simple Face Mask  Additional Equipment:   Intra-op Plan:   Post-operative Plan:   Informed Consent: I have reviewed the patients History and Physical, chart, labs and discussed the procedure including the risks, benefits and alternatives for the proposed anesthesia with the patient or authorized representative who has indicated his/her understanding and acceptance.     Dental advisory given  Plan Discussed with: CRNA and Surgeon  Anesthesia Plan Comments:         Anesthesia Quick Evaluation

## 2019-03-01 NOTE — Transfer of Care (Signed)
Immediate Anesthesia Transfer of Care Note  Patient: Mark Frey  Procedure(s) Performed: Procedure(s): COLONOSCOPY (N/A) BIOPSY ILEOSCOPY THROUGH STOMA (N/A)  Patient Location: PACU  Anesthesia Type:MAC  Level of Consciousness:  sedated, patient cooperative and responds to stimulation  Airway & Oxygen Therapy:Patient Spontanous Breathing and Patient connected to face mask oxgen  Post-op Assessment:  Report given to PACU RN and Post -op Vital signs reviewed and stable  Post vital signs:  Reviewed and stable  Last Vitals:  Vitals:   03/01/19 0659 03/01/19 0802  BP: 101/69 (!) 102/59  Pulse: 74 80  Resp: 16 12  Temp: 36.5 C 36.8 C  SpO2: 390% 30%    Complications: No apparent anesthesia complications

## 2019-03-01 NOTE — Progress Notes (Signed)
CRITICAL VALUE ALERT  Critical Value: Blood Glucose 68  Date & Time Notied:0230  Provider Notified: Yes  Orders Received/Actions taken: Standing orders for Hypoglycemia

## 2019-03-02 LAB — CBC WITH DIFFERENTIAL/PLATELET
Abs Immature Granulocytes: 0.02 10*3/uL (ref 0.00–0.07)
Basophils Absolute: 0 10*3/uL (ref 0.0–0.1)
Basophils Relative: 1 %
Eosinophils Absolute: 0 10*3/uL (ref 0.0–0.5)
Eosinophils Relative: 1 %
HCT: 25.7 % — ABNORMAL LOW (ref 39.0–52.0)
Hemoglobin: 7.8 g/dL — ABNORMAL LOW (ref 13.0–17.0)
Immature Granulocytes: 1 %
Lymphocytes Relative: 11 %
Lymphs Abs: 0.3 10*3/uL — ABNORMAL LOW (ref 0.7–4.0)
MCH: 30.2 pg (ref 26.0–34.0)
MCHC: 30.4 g/dL (ref 30.0–36.0)
MCV: 99.6 fL (ref 80.0–100.0)
Monocytes Absolute: 0.7 10*3/uL (ref 0.1–1.0)
Monocytes Relative: 24 %
Neutro Abs: 1.8 10*3/uL (ref 1.7–7.7)
Neutrophils Relative %: 62 %
Platelets: 129 10*3/uL — ABNORMAL LOW (ref 150–400)
RBC: 2.58 MIL/uL — ABNORMAL LOW (ref 4.22–5.81)
RDW: 18.8 % — ABNORMAL HIGH (ref 11.5–15.5)
WBC: 3 10*3/uL — ABNORMAL LOW (ref 4.0–10.5)
nRBC: 0 % (ref 0.0–0.2)

## 2019-03-02 LAB — GLUCOSE, CAPILLARY
Glucose-Capillary: 73 mg/dL (ref 70–99)
Glucose-Capillary: 94 mg/dL (ref 70–99)
Glucose-Capillary: 130 mg/dL — ABNORMAL HIGH (ref 70–99)
Glucose-Capillary: 88 mg/dL (ref 70–99)

## 2019-03-02 NOTE — TOC Progression Note (Signed)
Transition of Care [facility]) - Progression Note    Patient Details  Name: Mark Frey MRN: [id number] Date of Birth: June 20, 1956  [facility]) CM/SW Contact  Lauryl Seyer, Juliann Pulse, RN Phone Number: 03/02/2019, 3:46 PM  Clinical Narrative:  Physical therapy re ordered-needed for auth for insurance. [facility] rep will start auth on Monday.     Expected Discharge Plan: [place] Barriers to Discharge: Continued Medical Work up(have received insurance [address].)  Expected Discharge Plan and Services Expected Discharge Plan: [place]   Discharge Planning Services: CM Consult Post Acute Care Choice: [place] Living arrangements for the past 2 months: Single Family Home Expected Discharge Date: (unknown)                         HH Arranged: Therapist, sports, PT [place] Agency: [place] ([place]) Date [place]: 02/08/19 Time [place]: 1244 Representative spoke with at [place]: [place] ([place]) Interventions    Readmission Risk Interventions Readmission Risk Prevention Plan 02/08/2019  Transportation Screening Complete  Medication Review Press photographer) Complete  PCP or Specialist appointment within 3-5 days of discharge Complete  HRI or [place] Complete  SW Recovery Care/Counseling Consult Complete  [place] Not Applicable  Some recent data might be hidden

## 2019-03-02 NOTE — TOC Progression Note (Signed)
Transition of Care [facility]) - Progression Note    Patient Details  Name: CRANDALL HARVEL MRN: [id number] Date of Birth: Oct 16, 1955  [facility]) CM/SW Contact  Rickesha Veracruz, Juliann Pulse, RN Phone Number: 03/02/2019, 3:13 PM  Clinical Narrative: For d/c Monday SNF TPN d/c. Advancing diet. [place] following-will get auth. MD updated to order SARS for COVID.      Expected Discharge Plan: [place] Barriers to Discharge: Continued Medical Work up(have received insurance [place].)  Expected Discharge Plan and Services Expected Discharge Plan: [place]   Discharge Planning Services: CM Consult Post Acute Care Choice: [place] Living arrangements for the past 2 months: Single Family Home Expected Discharge Date: (unknown)                         HH Arranged: Therapist, sports, PT [place] Agency: [place] ([place]) Date [place]: 02/08/19 Time [place]: 1244 Representative spoke with at [place]: [place] ([place]) Interventions    Readmission Risk Interventions Readmission Risk Prevention Plan 02/08/2019  Transportation Screening Complete  Medication Review Press photographer) Complete  PCP or Specialist appointment within 3-5 days of discharge Complete  HRI or [place] Complete  SW Recovery Care/Counseling Consult Complete  [place] Not Applicable  Some recent data might be hidden

## 2019-03-02 NOTE — Progress Notes (Signed)
OT Cancellation Note and D/C from OT services  Patient Details Name: Mark Frey MRN: [id number] DOB: 23-May-1956   Cancelled Treatment:    Reason Eval/Treat Not Completed: Other (comment)   Pt with multiple refusals. Would not get up to EOB with assistance today.  Pt states he knows the benefits of movement, but he also adamently refuses. Will sign off. Verneal Wiers 03/02/2019, 3:27 PM  Lesle Chris, OTR/L Acute Rehabilitation Services [phone] [facility] pager [phone] office 03/02/2019

## 2019-03-02 NOTE — Progress Notes (Signed)
Report received from day shift RN, assessment unchanged. Andre Lefort

## 2019-03-02 NOTE — Progress Notes (Signed)
PROGRESS NOTE    Mark Frey  QIO:[id number] DOB: [birth year] DOA: 02/07/2019 PCP: [name], MD    Brief Narrative:  63 yo male who presented with weakness. 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 Patient developed worsening weakness for several weeks,tothe point where he was not able to stand by his own. On his initial physical examination his temperature was 99, heart rate 110, respiratory rate 18, blood pressure 104/74, oxygen saturation 100%. His lungswereclear to auscultation bilaterally, heart S1-S2 present rhythmic, abdomen was soft, nontender, no lower extremity edema, patient had weakness on the left lower extremity compared to the right. His affect was flat.  Patient was admitted to the hospital working diagnosis of dehydration complicated by anion gap metabolic acidosis and metabolic encephalopathy.  Patient continued to deteriorate, he was placed on TPN for nutrition. CT of the abdomen and pelvis showed multiple fluid collections along the course of the descending sigmoid colon consistent with developing abscess. Patient was placed on IV antibiotic therapy and surgery was consulted.  June 11 patient underwent percutaneous drainage of abscess collection. Has declined their laparotomy with possible colectomy and ileostomy.  June 18, Underwent colonoscopy finding severe colitis, biopsies performed.    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)   Deep vein thrombosis (DVT) of femoral vein of right lower extremity (HCC)   Diabetes mellitus (HCC)   Bowel obstruction in setting of lymphoma and sigmoid stricture   Stricture of sigmoid colon  (HCC)   Hyponatremia   Malnutrition of moderate degree   Dehydration   Palliative care by specialist   Goals of care, counseling/discussion   Symptomatic anemia   Atrophy of muscle of multiple sites   Depression   Abnormal CT scan, gastrointestinal tract   Thrombocytopenia (HCC)   Abscess of sigmoid colon   Loop ilesotomy for fecal diversion of sigmoid stricture   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 02/22/19 aspiration of anterior and lateral proximal sigmoid collection, 4 ml bloody and turbid fluid, culture positive for pseudomonas and morganella.   Continue to tolerate full liquids well, with no nausea or vomiting, persistent abdominal pain but seems to be controlled with analgesics. Continue IV zosyn #4, before on cefepime#4 Zosyn #13  2. High grade B cell lymphoma(on remission).On systemic steroids  with decadron.  3. T2DM with hypoglcyemia.Persistent low glucose 94 to 130, will discontinue insulin sliding scale.    4. Calorie protein malnutrition.Nutritional supplementation.   5. Right lower extremity dvt.Continue to hold anticoagulation due to risk of GI bleeding.   6. Sacrum stage 2 pressure ulcer, present on admission. Local wound care per wound care team.   7. Multifactorial anemia. Patient had prbc transfusions during his hospitalization. Hgb stable at 7,8 with Hct at 25,7.   DVT prophylaxis:scd Code Status:dnr Family Communication:no family at the bedside Disposition Plan/ discharge barriers:pending clinical improvement, colonic biopsy.    Body mass index is 22.46 kg/m. Malnutrition Type:  Nutrition Problem: Increased nutrient needs Etiology: chronic illness,  cancer and cancer related  treatments   Malnutrition Characteristics:  Signs/Symptoms: estimated needs   Nutrition Interventions:  Interventions: Magic cup  RN Pressure Injury Documentation: Pressure Injury 01/04/19 Stage II -  Partial thickness loss of dermis presenting as a shallow open ulcer with a red, pink wound bed without slough. redness around wound bed (Active)  01/04/19 0930  Location:   Location Orientation: Left;Right;Medial  Staging: Stage II -  Partial thickness loss of dermis presenting as a shallow open ulcer with a red, pink wound bed without slough.  Wound Description (Comments): redness around wound bed  Present on Admission: Yes     Pressure Injury 02/10/19 Sacrum Mid Stage II -  Partial thickness loss of dermis presenting as a shallow open ulcer with a red, pink wound bed without slough. partial thickness of dermis, first layer of skin off presenting red/pink in color (Active)  02/10/19 1043  Location: Sacrum  Location Orientation: Mid  Staging: Stage II -  Partial thickness loss of dermis presenting as a shallow open ulcer with a red, pink wound bed without slough.  Wound Description (Comments): partial thickness of dermis, first layer of skin off presenting red/pink in color  Present on Admission:      Consultants:   GI   Surgery   Procedures:   Colonoscopy   Antimicrobials:   IV Zosyn     Subjective: Patient has been tolerating po well, his abdominal pain on the left lower side is controlled with analgesics, no nausea or vomiting, continue to be very weak and deconditioned.   Objective: Vitals:   03/01/19 1005 03/01/19 1428 03/01/19 2225 03/02/19 0418  BP: 102/65 100/66 110/68 106/72  Pulse: 80 89 79 79  Resp: 14 12 16 14   Temp: 98.3 F (36.8 C) 98.4 F (36.9 C) 98.2 F (36.8 C) 98.2 F (36.8 C)  TempSrc: Oral Oral Oral Oral  SpO2: 98% 99% 98% 99%  Weight:      Height:        Intake/Output Summary (Last 24 hours) at 03/02/2019  1142 Last data filed at 03/01/2019 2230 Gross per 24 hour  Intake 364.4 ml  Output 1000 ml  Net -635.6 ml   Filed Weights   02/08/19 1726 02/22/19 1321 03/01/19 0802  Weight: 69 kg 69 kg 69 kg    Examination:   General: deconditioned, not in pain.  Neurology: Awake and alert, non focal  E ENT: mild pallor, no icterus, oral mucosa moist Cardiovascular: No JVD. S1-S2 present, rhythmic, no gallops, rubs, or murmurs. Trace lower extremity edema. Pulmonary: positive breath sounds bilaterally, adequate air movement, no wheezing, rhonchi or rales. Gastrointestinal. Abdomen protuberant with no organomegaly, non tender, no rebound or guarding. Colostomy bag in place.  Skin. No rashes Musculoskeletal: no joint deformities     Data Reviewed: I have personally reviewed following labs and imaging studies  CBC: Recent Labs  Lab 02/26/19 0311 02/28/19 0325 03/01/19 0333 03/02/19 0340  WBC 3.5* 3.2* 4.2 3.0*  NEUTROABS 2.5 2.3 3.0 1.8  HGB 7.6* 7.8* 8.1* 7.8*  HCT 25.2* 25.5* 26.2* 25.7*  MCV 101.2* 100.8* 101.2* 99.6  PLT 123* 138* 147* 119*   Basic Metabolic Panel: Recent Labs  Lab 02/25/19 0330 02/26/19 0311 02/28/19 0325 03/01/19 0333  NA 137 136 137 133*  K 4.1 4.2 4.1 3.6  CL 103 103 102 98  CO2 28 25 26 25   GLUCOSE 97 165* 157* 105*  BUN 19 21 18 11   CREATININE 0.32* 0.31* 0.32* 0.41*  CALCIUM 8.5* 8.3* 8.7* 8.9  MG  --  1.7  --   --   PHOS  --  3.2  --   --    GFR: Estimated Creatinine Clearance: 93.4 mL/min (A) (by C-G formula based on SCr of 0.41 mg/dL (L)). Liver Function Tests: Recent Labs  Lab 02/26/19 0311  AST 13*  ALT 26  ALKPHOS 65  BILITOT 0.2*  PROT 5.2*  ALBUMIN 2.1*   No results for input(s): LIPASE, AMYLASE in the last 168 hours. No results for input(s): AMMONIA in the last 168 hours. Coagulation Profile: No results for input(s): INR, PROTIME in the last 168 hours. Cardiac Enzymes: No results for input(s): CKTOTAL, CKMB, CKMBINDEX,  TROPONINI in the last 168 hours. BNP (last 3 results) No results for input(s): PROBNP in the last 8760 hours. HbA1C: No results for input(s): HGBA1C in the last 72 hours. CBG: Recent Labs  Lab 03/01/19 1208 03/01/19 1430 03/01/19 1717 03/01/19 2213 03/02/19 0819  GLUCAP 69* 108* 97 96 73   Lipid Profile: No results for input(s): CHOL, HDL, LDLCALC, TRIG, CHOLHDL, LDLDIRECT in the last 72 hours. Thyroid Function Tests: No results for input(s): TSH, T4TOTAL, FREET4, T3FREE, THYROIDAB in the last 72 hours. Anemia Panel: No results for input(s): VITAMINB12, FOLATE, FERRITIN, TIBC, IRON, RETICCTPCT in the last 72 hours.    Radiology Studies: I have reviewed all of the imaging during this hospital visit personally     Scheduled Meds: . cholestyramine light  4 g Oral 3 times per day  . citalopram  20 mg Oral Daily  . dexamethasone  1 mg Oral Q breakfast  . insulin aspart  0-9 Units Subcutaneous TID AC  . multivitamin with minerals  1 tablet Oral Daily  . oxandrolone  5 mg Oral BID  . zinc sulfate  220 mg Oral BID   Continuous Infusions: . sodium chloride Stopped (02/27/19 1430)  . piperacillin-tazobactam 3.375 g (03/02/19 0507)     LOS: 22 days        Chelse Matas Gerome Apley, MD

## 2019-03-02 NOTE — Progress Notes (Addendum)
Pathology report from colon and ileum biopsies still pending. When pathology returns we will return and review results with the patient.

## 2019-03-02 NOTE — Progress Notes (Signed)
Refusing to get out of bed even to sit in chair.

## 2019-03-03 DIAGNOSIS — Z515 Encounter for palliative care: Secondary | ICD-10-CM

## 2019-03-03 LAB — CBC WITH DIFFERENTIAL/PLATELET
Abs Immature Granulocytes: 0.04 10*3/uL (ref 0.00–0.07)
Basophils Absolute: 0 10*3/uL (ref 0.0–0.1)
Basophils Relative: 1 %
Eosinophils Absolute: 0 10*3/uL (ref 0.0–0.5)
Eosinophils Relative: 1 %
HCT: 27.6 % — ABNORMAL LOW (ref 39.0–52.0)
Hemoglobin: 8.4 g/dL — ABNORMAL LOW (ref 13.0–17.0)
Immature Granulocytes: 1 %
Lymphocytes Relative: 11 %
Lymphs Abs: 0.3 10*3/uL — ABNORMAL LOW (ref 0.7–4.0)
MCH: 30.9 pg (ref 26.0–34.0)
MCHC: 30.4 g/dL (ref 30.0–36.0)
MCV: 101.5 fL — ABNORMAL HIGH (ref 80.0–100.0)
Monocytes Absolute: 0.6 10*3/uL (ref 0.1–1.0)
Monocytes Relative: 21 %
Neutro Abs: 1.9 10*3/uL (ref 1.7–7.7)
Neutrophils Relative %: 65 %
Platelets: 139 10*3/uL — ABNORMAL LOW (ref 150–400)
RBC: 2.72 MIL/uL — ABNORMAL LOW (ref 4.22–5.81)
RDW: 19.1 % — ABNORMAL HIGH (ref 11.5–15.5)
WBC: 2.9 10*3/uL — ABNORMAL LOW (ref 4.0–10.5)
nRBC: 0 % (ref 0.0–0.2)

## 2019-03-03 LAB — BASIC METABOLIC PANEL
Anion gap: 9 (ref 5–15)
BUN: 9 mg/dL (ref 8–23)
CO2: 26 mmol/L (ref 22–32)
Calcium: 9.1 mg/dL (ref 8.9–10.3)
Chloride: 100 mmol/L (ref 98–111)
Creatinine, Ser: 0.47 mg/dL — ABNORMAL LOW (ref 0.61–1.24)
GFR calc Af Amer: >60 mL/min (ref >60)
GFR calc non Af Amer: >60 mL/min (ref >60)
Glucose, Bld: 72 mg/dL (ref 70–99)
Potassium: 3.5 mmol/L (ref 3.5–5.1)
Sodium: 135 mmol/L (ref 135–145)

## 2019-03-03 LAB — GLUCOSE, CAPILLARY
Glucose-Capillary: 191 mg/dL — ABNORMAL HIGH (ref 70–99)
Glucose-Capillary: 69 mg/dL — ABNORMAL LOW (ref 70–99)
Glucose-Capillary: 97 mg/dL (ref 70–99)
Glucose-Capillary: 97 mg/dL (ref 70–99)
Glucose-Capillary: 88 mg/dL (ref 70–99)
Glucose-Capillary: 131 mg/dL — ABNORMAL HIGH (ref 70–99)

## 2019-03-03 MED ORDER — DEXTROSE 50 % IV SOLN
12.5000 g | INTRAVENOUS | Status: AC
  Administered 2019-03-03: 05:00:00 12.5 g via INTRAVENOUS

## 2019-03-03 MED ORDER — DEXTROSE 50 % IV SOLN
INTRAVENOUS | Status: AC
  Filled 2019-03-03: qty 50

## 2019-03-03 NOTE — Progress Notes (Signed)
Pt refused to ambulate in hall x2 this shift.

## 2019-03-03 NOTE — Progress Notes (Signed)
PT Cancellation Note and Discharge   Patient Details Name: Mark Frey MRN: [id number] DOB: 22-Jan-1956   Cancelled Treatment:    Reason Eval/Treat Not Completed: Patient declined, no reason specified  Pt again refuses therapy, he does not want to participate in any activity, discussed importance of mobility, that pt desires to go to rehab/SNF, pt verbalizes understanding but continues to refuse any and all therapy.   PT signing off.     Kenyon Ana, PT  Pager: [phone] Acute Rehab Dept [facility]): [phone]   03/03/2019    [facility] 03/03/2019, 4:29 PM

## 2019-03-03 NOTE — Progress Notes (Signed)
Daily Progress Note   Patient Name: Mark Frey       Date: 03/03/2019 DOB: 12-24-1955  Age: 63 y.o. MRN#: [id number] Attending Physician: Tawni Millers Primary Care Physician: [name], MD Admit Date: 02/07/2019  Reason for Consultation/Follow-up: Establishing goals of care  Subjective: I met today with patient.  He is lying in bed and watching TV.  Mark Frey  Reports "just waiting for the biopsies to know what to do next."   Length of Stay: 23  Current Medications: Scheduled Meds:  . cholestyramine light  4 g Oral 3 times per day  . citalopram  20 mg Oral Daily  . dexamethasone  1 mg Oral Q breakfast  . multivitamin with minerals  1 tablet Oral Daily  . oxandrolone  5 mg Oral BID  . zinc sulfate  220 mg Oral BID    Continuous Infusions: . sodium chloride Stopped (02/27/19 1430)  . piperacillin-tazobactam 3.375 g (03/03/19 1303)    PRN Meds: sodium chloride, acetaminophen **OR** acetaminophen, heparin lock flush, HYDROcodone-acetaminophen, HYDROmorphone (DILAUDID) injection, iohexol, iohexol, ondansetron **OR** ondansetron (ZOFRAN) IV, sodium chloride flush  Physical Exam         General: Alert, awake, in no acute distress.  HEENT: No bruits, no goiter, no JVD Heart: Regular rate and rhythm. No murmur appreciated. Lungs: Good air movement, clear Abdomen: Soft, + ostomy  Ext: No significant edema Skin: Warm and dry Neuro: Grossly intact, nonfocal.  Vital Signs: BP 100/75 (BP Location: Right Arm)   Pulse 79   Temp 98.7 F (37.1 C) (Oral)   Resp 14   Ht 5' 9"  (1.753 m)   Wt 69 kg   SpO2 97%   BMI 22.46 kg/m  SpO2: SpO2: 97 % O2 Device: O2 Device: Room Air O2 Flow Rate: O2 Flow Rate (L/min): 8 L/min  Intake/output summary:    Intake/Output Summary (Last 24 hours) at 03/03/2019 2050 Last data filed at 03/03/2019 1430 Gross per 24 hour  Intake 240 ml  Output 500 ml  Net -260 ml   LBM: Last BM Date: 02/28/19 Baseline Weight: Weight: 69 kg Most recent weight: Weight: 69 kg       Palliative Assessment/Data:    Flowsheet Rows     Most Recent Value  Intake Tab  Referral Department  Hospitalist  Unit  at Time of Referral  ER  Palliative Care Primary Diagnosis  Cancer  Date Notified  02/07/19  Palliative Care Type  New Palliative care  Reason for referral  Clarify Goals of Care  Date of Admission  02/07/19  Date first seen by Palliative Care  02/08/19  # of days Palliative referral response time  1 Day(s)  # of days IP prior to Palliative referral  0  Clinical Assessment  Psychosocial & Spiritual Assessment  Palliative Care Outcomes      Patient Active Problem List   Diagnosis Date Noted  . Pancolitis ([place]) 02/22/2019  . Abscess of sigmoid colon 02/22/2019  . 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 Thrombocytopenia ([place])   . Abnormal CT scan, gastrointestinal tract   . Depression   . Symptomatic anemia   . Atrophy of muscle of multiple sites   . Palliative care by specialist   . Goals of care, counseling/discussion   . Dehydration 02/07/2019  . Hypomagnesemia 01/11/2019  . Neutropenia ([place]) 01/11/2019  . Other neutropenia ([place])   . Pressure injury of skin 01/07/2019  . Zinc deficiency   . Malnutrition of moderate degree 01/04/2019  . Radiation gastroenteritis   . Radiation colitis   . Hyponatremia 01/01/2019  . Pancytopenia ([place]) 01/01/2019  . Hematochezia   . Loop ilesotomy for fecal diversion of sigmoid stricture 08/25/2018  . Abnormal CT of the abdomen   . Severe protein-calorie malnutrition ([place]) 08/19/2018  . 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[place]) 08/19/2018  . Diverticulitis of colon   . Low magnesium level 07/30/2018  . Hypokalemia 07/21/2018  .  Bowel obstruction in setting of lymphoma and sigmoid stricture 07/21/2018  . Diarrhea 07/21/2018  . Diabetes mellitus ([place])   . Edema   . Deep vein thrombosis (DVT) of femoral vein of right lower extremity ([place])   . Counseling regarding advance care planning and goals of care 04/25/2018  . Gastrointestinal hemorrhage   . Encounter for antineoplastic chemotherapy   . 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[place]) 04/10/2018  . Port-A-Cath in place 03/27/2018  . Acute deep vein thrombosis (DVT) of femoral vein of right lower extremity ([place])   . 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[place]) 03/15/2018  . Bilateral pulmonary embolism ([place]) 03/10/2018  . Anemia 03/10/2018  . Hypoglycemia 03/10/2018  . Occult blood in stools 03/10/2018  . Abdominal mass 03/10/2018  . Tachycardia 01/31/2017  . Controlled diabetes mellitus type II without complication ([place]) 90/24/0973  . Hypertension 06/05/2014  . Irritable bowel syndrome 03/30/2010  . Morbid obesity ([place]) 12/29/2009  . Dysmetabolic syndrome X 53/29/9242  . BACK PAIN WITH RADICULOPATHY 06/09/2007  . Hyperlipidemia 05/12/2007  . ALLERGIC RHINITIS 05/12/2007    Palliative Care Assessment & Plan   Patient Profile:    Assessment: High grade Burkitt's lymphoma, B cell lymphoma. Severe pan colitis, radiation toxicity/diversion colitis Pancytopenia H/O DVT PE Failure to thrive Electrolyte abnormalities depression  Recommendations/Plan: - I met today with patient.  He is now s/p colonoscopy/ileostomy.  He reports that he remains open to any recommendations including surgical interventions.  He would like to get back biopsy results "to know what we are looking at before we make any more decisions." - Affect remains flat and depression has been concern.  On celexa 26m daily. - I have provided my card and contact information and will continue to check in with Mr. CStrozierperiodically.  Code Status:    Code Status Orders  (From  admission,  onward)         Start     Ordered   02/07/19 1355  Do not attempt resuscitation (DNR)  Continuous    Question Answer Comment  In the event of cardiac or respiratory ARREST Do not call a "code blue"   In the event of cardiac or respiratory ARREST Do not perform Intubation, CPR, defibrillation or ACLS   In the event of cardiac or respiratory ARREST Use medication by any route, position, wound care, and other measures to relive pain and suffering. May use oxygen, suction and manual treatment of airway obstruction as needed for comfort.      02/07/19 1354        Code Status History    Date Active Date Inactive Code Status Order ID Comments User Context   02/07/2019 1336 02/07/2019 1354 DNR [id number]  Mariel Aloe, MD Inpatient   01/01/2019 1254 01/16/2019 2130 Full Code [id number]  Debbe Odea, MD ED   08/18/2018 0146 08/28/2018 1729 Full Code [id number]  [name], MD ED   07/30/2018 2133 08/04/2018 1735 Full Code [id number]  [name], MD Inpatient   07/21/2018 1458 07/24/2018 1551 Full Code [id number]  [name], MD ED   07/03/2018 1053 07/07/2018 1645 Full Code [id number]  [name], MD Inpatient   06/12/2018 1113 06/16/2018 1542 Full Code [id number]  [name], MD Inpatient   05/22/2018 1036 05/26/2018 1644 Full Code [id number]  [name], MD Inpatient   05/01/2018 0959 05/05/2018 2317 Full Code [id number]  [name], MD Inpatient   04/10/2018 1604 04/14/2018 1640 Full Code [id number]  [name], MD Inpatient   03/10/2018 2125 03/25/2018 2126 Full Code [id number]  [name], MD Inpatient       Prognosis:   Unable to determine  Discharge Planning:  To Be Determined  Care plan was discussed with patient  Thank you for allowing the Palliative Medicine Team to assist in the care of this patient.   Time In: 1040 Time Out: 1100 Total Time 20 Prolonged Time Billed  no       Greater than 50%  of  this time was spent counseling and coordinating care related to the above assessment and plan.  Micheline Rough, MD [place] Team [phone]   Please contact Palliative Medicine Team phone at [phone] for questions and concerns.

## 2019-03-03 NOTE — Progress Notes (Signed)
PROGRESS NOTE    KEDDRICK WYNE  KGM:[id number] DOB: 04-02-1956 DOA: 02/07/2019 PCP: [name], MD    Brief Narrative:  63 yo male who presented with weakness. 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 Patient developed worsening weakness for several weeks,tothe point where he was not able to stand by his own. On his initial physical examination his temperature was 99, heart rate 110, respiratory rate 18, blood pressure 104/74, oxygen saturation 100%. His lungswereclear to auscultation bilaterally, heart S1-S2 present rhythmic, abdomen was soft, nontender, no lower extremity edema, patient had weakness on the left lower extremity compared to the right. His affect was flat.  Patient was admitted to the hospital working diagnosis of dehydration complicated by anion gap metabolic acidosis and metabolic encephalopathy.  Patient continued to deteriorate, he was placed on TPN for nutrition. CT of the abdomen and pelvis showed multiple fluid collections along the course of the descending sigmoid colon consistent with developing abscess. Patient was placed on IV antibiotic therapy and surgery was consulted.  June 11 patient underwent percutaneous drainage of abscess collection. Has declined their laparotomy with possible colectomy and ileostomy.  June 18, Underwent colonoscopy finding severe colitis, biopsies performed.   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)   Deep vein thrombosis (DVT) of femoral vein of right lower extremity (HCC)   Diabetes mellitus (HCC)   Bowel obstruction in setting of lymphoma and sigmoid stricture   Stricture of sigmoid colon  (HCC)   Hyponatremia   Malnutrition of moderate degree   Dehydration   Palliative care by specialist   Goals of care, counseling/discussion   Symptomatic anemia   Atrophy of muscle of multiple sites   Depression   Abnormal CT scan, gastrointestinal tract   Thrombocytopenia (HCC)   Abscess of sigmoid colon   Loop ilesotomy for fecal diversion of sigmoid stricture   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 02/22/19 aspiration of anterior and lateral proximal sigmoid collection, 4 ml bloody and turbid fluid, culture positive for pseudomonas and morganella.   WBC is 2,9, abdominal pain has been stable, tolerating well full liquids with no nausea or vomiting. Pending biopsy. Antibiotic duration is total 22 days today. ( IV zosyn #5, before on cefepime#4 Zosyn #13), will plan to stop antibiotic therapy after resulted biopsy. Follow with GI recommendations.   2. High grade B cell lymphoma(on remission).Continue with systemic steroids  withdecadron.  3. T2DM with hypoglcyemia.Persistent low glucose 94 to 130, will discontinue insulin sliding scale.  4. Calorie protein malnutrition.Will advance diet to regular, continue with nutritional supplementation.  5. Right lower extremity dvt.Holding anticoagulation due to colitis and risk of bleeding.  6. Sacrum stage 2 pressure ulcer, present on admission. Continue with local wound careper wound care team.  7. Multifactorial anemia. Stable Hg and Hct, no signs of bleeding.   DVT prophylaxis:scd Code Status:dnr Family Communication:no family at the bedside Disposition Plan/ discharge barriers:pending clinical improvement, colonic biopsy.  Body mass  index is 22.46 kg/m. Malnutrition Type:  Nutrition  Problem: Increased nutrient needs Etiology: chronic illness, cancer and cancer related treatments   Malnutrition Characteristics:  Signs/Symptoms: estimated needs   Nutrition Interventions:  Interventions: Magic cup  RN Pressure Injury Documentation: Pressure Injury 01/04/19 Stage II -  Partial thickness loss of dermis presenting as a shallow open ulcer with a red, pink wound bed without slough. redness around wound bed (Active)  01/04/19 0930  Location:   Location Orientation: Left;Right;Medial  Staging: Stage II -  Partial thickness loss of dermis presenting as a shallow open ulcer with a red, pink wound bed without slough.  Wound Description (Comments): redness around wound bed  Present on Admission: Yes     Pressure Injury 02/10/19 Sacrum Mid Stage II -  Partial thickness loss of dermis presenting as a shallow open ulcer with a red, pink wound bed without slough. partial thickness of dermis, first layer of skin off presenting red/pink in color (Active)  02/10/19 1043  Location: Sacrum  Location Orientation: Mid  Staging: Stage II -  Partial thickness loss of dermis presenting as a shallow open ulcer with a red, pink wound bed without slough.  Wound Description (Comments): partial thickness of dermis, first layer of skin off presenting red/pink in color  Present on Admission:      Consultants:   Surgery   GI   IR  Palliative Care   Procedures:    Percutaneous drainage of abdominal abscess.   Colonoscopy   Antimicrobials:   Zosyn     Subjective: Patient tolerating well full liquid diet, no nausea or vomiting, continue to be very weak and deconditioned, stable left lower abdominal pain.   Objective: Vitals:   03/02/19 0418 03/02/19 1300 03/02/19 2054 03/03/19 0509  BP: 106/72 111/71 101/64 112/71  Pulse: 79 77 61 65  Resp: 14 15 14 16   Temp: 98.2 F (36.8 C) 98.7 F (37.1 C) 97.9 F (36.6 C) 98.2  F (36.8 C)  TempSrc: Oral Oral Oral Oral  SpO2: 99% 97% 100% 100%  Weight:      Height:        Intake/Output Summary (Last 24 hours) at 03/03/2019 1215 Last data filed at 03/03/2019 0900 Gross per 24 hour  Intake 998.09 ml  Output 1450 ml  Net -451.91 ml   Filed Weights   02/08/19 1726 02/22/19 1321 03/01/19 0802  Weight: 69 kg 69 kg 69 kg    Examination:   General: deconditioned  Neurology: Awake and alert, non focal  E ENT: mild pallor, no icterus, oral mucosa moist Cardiovascular: No JVD. S1-S2 present, rhythmic, no gallops, rubs, or murmurs. No lower extremity edema. Pulmonary: vesicular breath sounds bilaterally, adequate air movement, no wheezing, rhonchi or rales. Gastrointestinal. Abdomen with no organomegaly, no rebound or guarding, mild tender to deep palpation on left lower quadrant.  Skin. No rashes Musculoskeletal: no joint deformities     Data Reviewed: I have personally reviewed following labs and imaging studies  CBC: Recent Labs  Lab 02/26/19 0311 02/28/19 0325 03/01/19 0333 03/02/19 0340 03/03/19 0338  WBC 3.5* 3.2* 4.2 3.0* 2.9*  NEUTROABS 2.5 2.3 3.0 1.8 1.9  HGB 7.6* 7.8* 8.1* 7.8* 8.4*  HCT 25.2* 25.5* 26.2* 25.7* 27.6*  MCV 101.2* 100.8* 101.2* 99.6 101.5*  PLT 123* 138* 147* 129* 528*   Basic Metabolic Panel: Recent Labs  Lab 02/25/19 0330 02/26/19 0311 02/28/19 0325 03/01/19 0333 03/03/19 0338  NA 137 136 137 133* 135  K 4.1 4.2 4.1 3.6 3.5  CL 103 103 102 98 100  CO2 28 25 26 25 26   GLUCOSE 97 165* 157* 105* 72  BUN 19 21 18 11 9   CREATININE 0.32* 0.31* 0.32* 0.41* 0.47*  CALCIUM 8.5* 8.3* 8.7* 8.9 9.1  MG  --  1.7  --   --   --   PHOS  --  3.2  --   --   --    GFR: Estimated Creatinine Clearance: 93.4 mL/min (A) (by C-G formula based on SCr of 0.47 mg/dL (L)). Liver Function Tests: Recent Labs  Lab 02/26/19 0311  AST 13*  ALT 26  ALKPHOS 65  BILITOT 0.2*  PROT 5.2*  ALBUMIN 2.1*   No results for input(s):  LIPASE, AMYLASE in the last 168 hours. No results for input(s): AMMONIA in the last 168 hours. Coagulation Profile: No results for input(s): INR, PROTIME in the last 168 hours. Cardiac Enzymes: No results for input(s): CKTOTAL, CKMB, CKMBINDEX, TROPONINI in the last 168 hours. BNP (last 3 results) No results for input(s): PROBNP in the last 8760 hours. HbA1C: No results for input(s): HGBA1C in the last 72 hours. CBG: Recent Labs  Lab 03/02/19 2032 03/03/19 0451 03/03/19 0516 03/03/19 0738 03/03/19 1115  GLUCAP 88 69* 191* 97 97   Lipid Profile: No results for input(s): CHOL, HDL, LDLCALC, TRIG, CHOLHDL, LDLDIRECT in the last 72 hours. Thyroid Function Tests: No results for input(s): TSH, T4TOTAL, FREET4, T3FREE, THYROIDAB in the last 72 hours. Anemia Panel: No results for input(s): VITAMINB12, FOLATE, FERRITIN, TIBC, IRON, RETICCTPCT in the last 72 hours.    Radiology Studies: I have reviewed all of the imaging during this hospital visit personally     Scheduled Meds: . cholestyramine light  4 g Oral 3 times per day  . citalopram  20 mg Oral Daily  . dexamethasone  1 mg Oral Q breakfast  . multivitamin with minerals  1 tablet Oral Daily  . oxandrolone  5 mg Oral BID  . zinc sulfate  220 mg Oral BID   Continuous Infusions: . sodium chloride Stopped (02/27/19 1430)  . piperacillin-tazobactam 3.375 g (03/03/19 0518)     LOS: 23 days        Caroleena Paolini Gerome Apley, MD

## 2019-03-03 NOTE — Progress Notes (Signed)
Hypoglycemic Event  CBG:69  Treatment: D50 25 mL (12.5 gm)  Symptoms: Shaky  Follow-up CBG: Time 0516 CBG Result:191 Possible Reasons for Event: Inadequate meal intake  Comments/MD notified: Bodenheimer   Pt got angry and yelled after refusing to take Soda/juice by mouth.   Gonzella Lex

## 2019-03-03 NOTE — Progress Notes (Signed)
Patients ostomy bag is leaking. Patient is refusing to let me change the  Bag. I have made two attempts regarding the bag. Patient also will not allow staff to clean him up or turn to allow me to assess his bottom.  I will  reapproach  the patient again during my shift.  Will continue to monitor patient.

## 2019-03-04 LAB — GLUCOSE, CAPILLARY
Glucose-Capillary: 70 mg/dL (ref 70–99)
Glucose-Capillary: 119 mg/dL — ABNORMAL HIGH (ref 70–99)
Glucose-Capillary: 123 mg/dL — ABNORMAL HIGH (ref 70–99)
Glucose-Capillary: 178 mg/dL — ABNORMAL HIGH (ref 70–99)

## 2019-03-04 NOTE — Progress Notes (Signed)
PROGRESS NOTE    Mark Frey  GXQ:[id number] DOB: 05-Aug-1956 DOA: 02/07/2019 PCP: [name], MD    Brief Narrative:  63 yo male who presented with weakness. 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 Patient developed worsening weakness for several weeks,tothe point where he was not able to stand by his own. On his initial physical examination his temperature was 99, heart rate 110, respiratory rate 18, blood pressure 104/74, oxygen saturation 100%. His lungswereclear to auscultation bilaterally, heart S1-S2 present rhythmic, abdomen was soft, nontender, no lower extremity edema, patient had weakness on the left lower extremity compared to the right. His affect was flat.  Patient was admitted to the hospital working diagnosis of dehydration complicated by anion gap metabolic acidosis and metabolic encephalopathy.  Patient continued to deteriorate, he was placed on TPN for nutrition. CT of the abdomen and pelvis showed multiple fluid collections along the course of the descending sigmoid colon consistent with developing abscess. Patient was placed on IV antibiotic therapy and surgery was consulted.  June 11 patient underwent percutaneous drainage of abscess collection. Has declined their laparotomy with possible colectomy and ileostomy.  June 18, Underwent colonoscopy finding severe colitis, biopsies performed.  June 20, patient's diet advance to regular, with good toleration.    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)   Deep vein thrombosis (DVT) of femoral vein of right lower extremity (HCC)   Diabetes mellitus (HCC)   Bowel obstruction in  setting of lymphoma and sigmoid stricture   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HCC)   Hyponatremia   Malnutrition of moderate degree   Dehydration   Palliative care by specialist   Goals of care, counseling/discussion   Symptomatic anemia   Atrophy of muscle of multiple sites   Depression   Abnormal CT scan, gastrointestinal tract   Thrombocytopenia (HCC)   Abscess of sigmoid colon   Loop ilesotomy for fecal diversion of sigmoid stricture   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 02/22/19 aspiration of anterior and lateral proximal sigmoid collection, 4 ml bloody and turbid fluid, culture positive for pseudomonas and morganella.   Tolerating well regular diet, abdominal pain continue to be well controlled, Continue pending biopsy result.   Antibiotic duration is total 23 days today. ( IV zosyn #6, before on cefepime#4 Zosyn #13). If biopsy non infectious etiology will consider stopping antibiotic therapy. Pending final GI recommendations.   2. High grade B cell lymphoma(on remission).On decadron, lymphoma has been in remission,  3. T2DMwith hypoglcyemia.capillary glucose continue to be on the lower normal limit, 70, 88, 119. Continue capillary glucose monitoring, tolerating po well, continue to hold on insulin therapy.   4. Calorie protein malnutrition.Continue with nutritional supplementation.  5. Right lower extremity dvt.Continue to hold on anticoagulation for now.  6. Sacrum stage 2 pressure ulcer, present on admission. On local wound careper wound care team.  7. Multifactorial anemia. Will check cell count in am.   DVT prophylaxis:scd Code Status:dnr Family Communication:no family at  the bedside Disposition Plan/  discharge barriers:pending clinical improvement, colonic biopsy.  Body mass index is 22.46 kg/m. Malnutrition Type:  Nutrition Problem: Increased nutrient needs Etiology: chronic illness, cancer and cancer related treatments   Malnutrition Characteristics:  Signs/Symptoms: estimated needs   Nutrition Interventions:  Interventions: Magic cup  RN Pressure Injury Documentation: Pressure Injury 01/04/19 Stage II -  Partial thickness loss of dermis presenting as a shallow open ulcer with a red, pink wound bed without slough. redness around wound bed (Active)  01/04/19 0930  Location:   Location Orientation: Left;Right;Medial  Staging: Stage II -  Partial thickness loss of dermis presenting as a shallow open ulcer with a red, pink wound bed without slough.  Wound Description (Comments): redness around wound bed  Present on Admission: Yes     Pressure Injury 02/10/19 Sacrum Mid Stage II -  Partial thickness loss of dermis presenting as a shallow open ulcer with a red, pink wound bed without slough. partial thickness of dermis, first layer of skin off presenting red/pink in color (Active)  02/10/19 1043  Location: Sacrum  Location Orientation: Mid  Staging: Stage II -  Partial thickness loss of dermis presenting as a shallow open ulcer with a red, pink wound bed without slough.  Wound Description (Comments): partial thickness of dermis, first layer of skin off presenting red/pink in color  Present on Admission:      Consultants:   GI   Surgery   Palliative   Procedures:   Colonoscopy   Antimicrobials:   Zosyn IV     Subjective: Patient continue to improve, now tolerating regular diet, his abdominal pain has been controlled, no chest pain, no dyspnea, no nausea or vomiting. Continue to be very weak and deconditioned.  Objective: Vitals:   03/02/19 1300 03/02/19 2054 03/03/19 0509 03/03/19 1430  BP: 111/71 101/64 112/71 100/75   Pulse: 77 61 65 79  Resp: 15 14 16 14   Temp: 98.7 F (37.1 C) 97.9 F (36.6 C) 98.2 F (36.8 C) 98.7 F (37.1 C)  TempSrc: Oral Oral Oral Oral  SpO2: 97% 100% 100% 97%  Weight:      Height:        Intake/Output Summary (Last 24 hours) at 03/04/2019 1200 Last data filed at 03/03/2019 1430 Gross per 24 hour  Intake -  Output 100 ml  Net -100 ml   Filed Weights   02/08/19 1726 02/22/19 1321 03/01/19 0802  Weight: 69 kg 69 kg 69 kg    Examination:   General: deconditioned  Neurology: Awake and alert, non focal  E ENT: no pallor, no icterus, oral mucosa moist Cardiovascular: No JVD. S1-S2 present, rhythmic, no gallops, rubs, or murmurs. Trace lower extremity edema. Pulmonary: positive breath sounds bilaterally, adequate air movement, no wheezing, rhonchi or rales. Gastrointestinal. Abdomen mild distended with no organomegaly, non tender to superficial palpation, no rebound or guarding/ colostomy bag in place.  Skin. No rashes Musculoskeletal: no joint deformities     Data Reviewed: I have personally reviewed following labs and imaging studies  CBC: Recent Labs  Lab 02/26/19 0311 02/28/19 0325 03/01/19 0333 03/02/19 0340 03/03/19 0338  WBC 3.5* 3.2* 4.2 3.0* 2.9*  NEUTROABS 2.5 2.3 3.0 1.8 1.9  HGB 7.6* 7.8* 8.1* 7.8* 8.4*  HCT 25.2* 25.5* 26.2* 25.7* 27.6*  MCV 101.2* 100.8* 101.2* 99.6 101.5*  PLT 123* 138* 147* 129* 242*   Basic Metabolic Panel: Recent Labs  Lab 02/26/19 0311 02/28/19 0325 03/01/19 0333 03/03/19 0338  NA 136 137 133* 135  K 4.2  4.1 3.6 3.5  CL 103 102 98 100  CO2 25 26 25 26   GLUCOSE 165* 157* 105* 72  BUN 21 18 11 9   CREATININE 0.31* 0.32* 0.41* 0.47*  CALCIUM 8.3* 8.7* 8.9 9.1  MG 1.7  --   --   --   PHOS 3.2  --   --   --    GFR: Estimated Creatinine Clearance: 93.4 mL/min (A) (by C-G formula based on SCr of 0.47 mg/dL (L)). Liver Function Tests: Recent Labs  Lab 02/26/19 0311  AST 13*  ALT 26  ALKPHOS 65  BILITOT 0.2*   PROT 5.2*  ALBUMIN 2.1*   No results for input(s): LIPASE, AMYLASE in the last 168 hours. No results for input(s): AMMONIA in the last 168 hours. Coagulation Profile: No results for input(s): INR, PROTIME in the last 168 hours. Cardiac Enzymes: No results for input(s): CKTOTAL, CKMB, CKMBINDEX, TROPONINI in the last 168 hours. BNP (last 3 results) No results for input(s): PROBNP in the last 8760 hours. HbA1C: No results for input(s): HGBA1C in the last 72 hours. CBG: Recent Labs  Lab 03/03/19 1115 03/03/19 1724 03/03/19 2019 03/04/19 0744 03/04/19 1119  GLUCAP 97 88 131* 70 119*   Lipid Profile: No results for input(s): CHOL, HDL, LDLCALC, TRIG, CHOLHDL, LDLDIRECT in the last 72 hours. Thyroid Function Tests: No results for input(s): TSH, T4TOTAL, FREET4, T3FREE, THYROIDAB in the last 72 hours. Anemia Panel: No results for input(s): VITAMINB12, FOLATE, FERRITIN, TIBC, IRON, RETICCTPCT in the last 72 hours.    Radiology Studies: I have reviewed all of the imaging during this hospital visit personally     Scheduled Meds: . cholestyramine light  4 g Oral 3 times per day  . citalopram  20 mg Oral Daily  . dexamethasone  1 mg Oral Q breakfast  . multivitamin with minerals  1 tablet Oral Daily  . oxandrolone  5 mg Oral BID  . zinc sulfate  220 mg Oral BID   Continuous Infusions: . sodium chloride Stopped (02/27/19 1430)  . piperacillin-tazobactam 3.375 g (03/04/19 0522)     LOS: 24 days        Cassius Cullinane Gerome Apley, MD

## 2019-03-04 NOTE — Progress Notes (Signed)
Made 2 more attempts to assess patients bottom and change leaking ostomy bag patient still continues to refuse. Patient is also refusing morning vitals. Pt stated to [facility] and nurse "I dont have to let you do a damn thing"  Will continue to monitor

## 2019-03-05 ENCOUNTER — Encounter (HOSPITAL_COMMUNITY): Payer: Self-pay | Admitting: Gastroenterology

## 2019-03-05 LAB — CBC WITH DIFFERENTIAL/PLATELET
Abs Immature Granulocytes: 0.03 10*3/uL (ref 0.00–0.07)
Basophils Absolute: 0 10*3/uL (ref 0.0–0.1)
Basophils Relative: 1 %
Eosinophils Absolute: 0 10*3/uL (ref 0.0–0.5)
Eosinophils Relative: 1 %
HCT: 26.4 % — ABNORMAL LOW (ref 39.0–52.0)
Hemoglobin: 8.1 g/dL — ABNORMAL LOW (ref 13.0–17.0)
Immature Granulocytes: 1 %
Lymphocytes Relative: 12 %
Lymphs Abs: 0.4 10*3/uL — ABNORMAL LOW (ref 0.7–4.0)
MCH: 31.4 pg (ref 26.0–34.0)
MCHC: 30.7 g/dL (ref 30.0–36.0)
MCV: 102.3 fL — ABNORMAL HIGH (ref 80.0–100.0)
Monocytes Absolute: 0.8 10*3/uL (ref 0.1–1.0)
Monocytes Relative: 27 %
Neutro Abs: 1.9 10*3/uL (ref 1.7–7.7)
Neutrophils Relative %: 58 %
Platelets: 146 10*3/uL — ABNORMAL LOW (ref 150–400)
RBC: 2.58 MIL/uL — ABNORMAL LOW (ref 4.22–5.81)
RDW: 19 % — ABNORMAL HIGH (ref 11.5–15.5)
WBC: 3.1 10*3/uL — ABNORMAL LOW (ref 4.0–10.5)
nRBC: 0 % (ref 0.0–0.2)

## 2019-03-05 LAB — BASIC METABOLIC PANEL
Anion gap: 10 (ref 5–15)
BUN: 9 mg/dL (ref 8–23)
CO2: 24 mmol/L (ref 22–32)
Calcium: 8.8 mg/dL — ABNORMAL LOW (ref 8.9–10.3)
Chloride: 103 mmol/L (ref 98–111)
Creatinine, Ser: 0.37 mg/dL — ABNORMAL LOW (ref 0.61–1.24)
GFR calc Af Amer: >60 mL/min (ref >60)
GFR calc non Af Amer: >60 mL/min (ref >60)
Glucose, Bld: 95 mg/dL (ref 70–99)
Potassium: 3.3 mmol/L — ABNORMAL LOW (ref 3.5–5.1)
Sodium: 137 mmol/L (ref 135–145)

## 2019-03-05 LAB — GLUCOSE, CAPILLARY
Glucose-Capillary: 79 mg/dL (ref 70–99)
Glucose-Capillary: 180 mg/dL — ABNORMAL HIGH (ref 70–99)
Glucose-Capillary: 133 mg/dL — ABNORMAL HIGH (ref 70–99)
Glucose-Capillary: 131 mg/dL — ABNORMAL HIGH (ref 70–99)

## 2019-03-05 NOTE — Progress Notes (Signed)
PROGRESS NOTE    Mark Frey  KGM:[id number] DOB: 14-Jun-1956 DOA: 02/07/2019 PCP: [name], MD    Brief Narrative:  63 yo male who presented with weakness. 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 Patient developed worsening weakness for several weeks,tothe point where he was not able to stand by his own. On his initial physical examination his temperature was 99, heart rate 110, respiratory rate 18, blood pressure 104/74, oxygen saturation 100%. His lungswereclear to auscultation bilaterally, heart S1-S2 present rhythmic, abdomen was soft, nontender, no lower extremity edema, patient had weakness on the left lower extremity compared to the right. His affect was flat.  Patient was admitted to the hospital working diagnosis of dehydration complicated by anion gap metabolic acidosis and metabolic encephalopathy.  Patient continued to deteriorate, he was placed on TPN for nutrition. CT of the abdomen and pelvis showed multiple fluid collections along the course of the descending sigmoid colon consistent with developing abscess. Patient was placed on IV antibiotic therapy and surgery was consulted.  June 11 patient underwent percutaneous drainage of abscess collection. Has declined their laparotomy with possible colectomy and ileostomy.  June 18, Underwent colonoscopy finding severe colitis, biopsies performed.  June 20, patient's diet advance to regular, with good toleration.     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)   Deep vein thrombosis (DVT) of femoral vein of right lower extremity (HCC)   Diabetes mellitus (HCC)   Bowel obstruction  in setting of lymphoma and sigmoid stricture   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HCC)   Hyponatremia   Malnutrition of moderate degree   Dehydration   Palliative care by specialist   Goals of care, counseling/discussion   Symptomatic anemia   Atrophy of muscle of multiple sites   Depression   Abnormal CT scan, gastrointestinal tract   Thrombocytopenia (HCC)   Abscess of sigmoid colon   Loop ilesotomy for fecal diversion of sigmoid stricture   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 02/22/19 aspiration of anterior and lateral proximal sigmoid collection, 4 ml bloody and turbid fluid, culture positive for pseudomonas and morganella.   No nausea or vomiting, his abdominal pain is stable and controlled, tolerating well regular diet.   His antibiotic duration is total 24 days today. (IV zosyn #7, before on cefepime#4 Zosyn #13). Biopsy with active chronic inflammation, will plan to discontinue antibiotics in am.   2. High grade B cell lymphoma(on remission).continue with decadron,. B cell lymphoma has been in remission, intestinal biopsy negative for lymphoma.   3. T2DMwith hypoglcyemia.Patient is tolerating po well, glucose fasting is 95 today, continue close monitoring, hold on insulin therapy.  4. Calorie protein malnutrition.Nutritional supplementation.  5. Right lower extremity dvt.For now will continue to hold on anticoagulation, risk of intestinal bleeding in the setting of colitis.   6. Sacrum stage 2 pressure ulcer, present on admission.continue with local wound careper wound care team.  7. Multifactorial anemia.Hgb and Hct have remained stable, no indication for PRBC transfusion.  DVT  prophylaxis:scd  Code Status:dnr Family Communication:no family at the bedside Disposition Plan/ discharge barriers:[facility]  Body mass index is 22.46 kg/m. Malnutrition Type:  Nutrition Problem: Increased nutrient needs Etiology: chronic illness, cancer and cancer related treatments   Malnutrition Characteristics:  Signs/Symptoms: estimated needs   Nutrition Interventions:  Interventions: Magic cup  RN Pressure Injury Documentation: Pressure Injury 01/04/19 Stage II -  Partial thickness loss of dermis presenting as a shallow open ulcer with a red, pink wound bed without slough. redness around wound bed (Active)  01/04/19 0930  Location:   Location Orientation: Left;Right;Medial  Staging: Stage II -  Partial thickness loss of dermis presenting as a shallow open ulcer with a red, pink wound bed without slough.  Wound Description (Comments): redness around wound bed  Present on Admission: Yes     Pressure Injury 02/10/19 Sacrum Mid Stage II -  Partial thickness loss of dermis presenting as a shallow open ulcer with a red, pink wound bed without slough. partial thickness of dermis, first layer of skin off presenting red/pink in color (Active)  02/10/19 1043  Location: Sacrum  Location Orientation: Mid  Staging: Stage II -  Partial thickness loss of dermis presenting as a shallow open ulcer with a red, pink wound bed without slough.  Wound Description (Comments): partial thickness of dermis, first layer of skin off presenting red/pink in color  Present on Admission:     Consultants:   GI   Surgery   Palliative   IR  Procedures:   Colonoscopy  Abdominal collection aspiration per IR   Antimicrobials:   Zosyn IV    Subjective: Patient continue to tolerate po well, now on regular diet, no nausea or vomiting, no chest pain or dyspnea. Continue to have left lower quadrant abdominal pain that has been stable.   Objective: Vitals:   03/03/19  0509 03/03/19 1430 03/03/19 2046 03/05/19 0450  BP: 112/71 100/75 103/67 108/71  Pulse: 65 79 84 68  Resp: 16 14 16 14   Temp: 98.2 F (36.8 C) 98.7 F (37.1 C) 98.5 F (36.9 C) 98.5 F (36.9 C)  TempSrc: Oral Oral Oral Oral  SpO2: 100% 97% 97% 100%  Weight:      Height:        Intake/Output Summary (Last 24 hours) at 03/05/2019 1025 Last data filed at 03/05/2019 0542 Gross per 24 hour  Intake -  Output 1375 ml  Net -1375 ml   Filed Weights   02/08/19 1726 02/22/19 1321 03/01/19 0802  Weight: 69 kg 69 kg 69 kg    Examination:   General: Not in pain or dyspnea, deconditioned Neurology: Awake and alert, non focal  E ENT: mild pallor, no icterus, oral mucosa moist Cardiovascular: No JVD. S1-S2 present, rhythmic, no gallops, rubs, or murmurs. No lower extremity edema. Pulmonary: vesicular breath sounds bilaterally, adequate air movement, no wheezing, rhonchi or rales. Gastrointestinal. Abdomen with no organomegaly, non tender to superficial palpation, no rebound or guarding. Colostomy bag in place.  Skin. No rashes Musculoskeletal: no joint deformities     Data Reviewed: I have personally reviewed following labs and imaging studies  CBC: Recent Labs  Lab 02/28/19 0325 03/01/19 0333 03/02/19 0340 03/03/19 0338 03/05/19 0420  WBC 3.2* 4.2 3.0* 2.9* 3.1*  NEUTROABS 2.3 3.0 1.8 1.9 1.9  HGB 7.8* 8.1* 7.8* 8.4* 8.1*  HCT 25.5* 26.2* 25.7* 27.6* 26.4*  MCV 100.8* 101.2* 99.6 101.5* 102.3*  PLT 138* 147* 129* 139* 102*   Basic Metabolic Panel: Recent Labs  Lab 02/28/19 0325 03/01/19 0333 03/03/19 0338 03/05/19 0420  NA 137 133* 135 137  K 4.1 3.6 3.5 3.3*  CL 102 98 100 103  CO2 26 25 26 24   GLUCOSE 157* 105* 72 95  BUN 18 11 9 9   CREATININE 0.32* 0.41* 0.47* 0.37*  CALCIUM 8.7* 8.9 9.1 8.8*   GFR: Estimated Creatinine Clearance: 93.4 mL/min (A) (by C-G formula based on SCr of 0.37 mg/dL (L)). Liver Function Tests: No results for input(s): AST, ALT,  ALKPHOS, BILITOT, PROT, ALBUMIN in the last 168 hours. No results for input(s): LIPASE, AMYLASE in the last 168 hours. No results for input(s): AMMONIA in the last 168 hours. Coagulation Profile: No results for input(s): INR, PROTIME in the last 168 hours. Cardiac Enzymes: No results for input(s): CKTOTAL, CKMB, CKMBINDEX, TROPONINI in the last 168 hours. BNP (last 3 results) No results for input(s): PROBNP in the last 8760 hours. HbA1C: No results for input(s): HGBA1C in the last 72 hours. CBG: Recent Labs  Lab 03/04/19 0744 03/04/19 1119 03/04/19 1634 03/04/19 2006 03/05/19 0830  GLUCAP 70 119* 123* 178* 79   Lipid Profile: No results for input(s): CHOL, HDL, LDLCALC, TRIG, CHOLHDL, LDLDIRECT in the last 72 hours. Thyroid Function Tests: No results for input(s): TSH, T4TOTAL, FREET4, T3FREE, THYROIDAB in the last 72 hours. Anemia Panel: No results for input(s): VITAMINB12, FOLATE, FERRITIN, TIBC, IRON, RETICCTPCT in the last 72 hours.    Radiology Studies: I have reviewed all of the imaging during this hospital visit personally     Scheduled Meds: . cholestyramine light  4 g Oral 3 times per day  . citalopram  20 mg Oral Daily  . dexamethasone  1 mg Oral Q breakfast  . multivitamin with minerals  1 tablet Oral Daily  . oxandrolone  5 mg Oral BID  . zinc sulfate  220 mg Oral BID   Continuous Infusions: . sodium chloride Stopped (02/27/19 1430)  . piperacillin-tazobactam 3.375 g (03/05/19 0500)     LOS: 25 days        Undrea Shipes Gerome Apley, MD

## 2019-03-05 NOTE — Progress Notes (Signed)
Patient ID: Mark Frey, male   DOB: February 27, 1956, 63 y.o.   MRN: [id number]    4 Days Post-Op  Subjective: Still with some Left-sided abdominal discomfort.  Ostomy leaking.  Objective: Vital signs in last 24 hours: Temp:  [98.5 F (36.9 C)] 98.5 F (36.9 C) (06/22 0450) Pulse Rate:  [68] 68 (06/22 0450) Resp:  [14] 14 (06/22 0450) BP: (108)/(71) 108/71 (06/22 0450) SpO2:  [100 %] 100 % (06/22 0450) Last BM Date: 03/04/19(ileostomy)  Intake/Output from previous day: 06/21 0701 - 06/22 0700 In: -  Out: 0254 [Urine:325; Stool:1050] Intake/Output this shift: No intake/output data recorded.  PE: Abd: soft, mildly tender on left side of abdomen.  +BS, ND, right-sided ostomy with feculent output and leaking.  Lab Results:  Recent Labs    03/03/19 0338 03/05/19 0420  WBC 2.9* 3.1*  HGB 8.4* 8.1*  HCT 27.6* 26.4*  PLT 139* 146*   BMET Recent Labs    03/03/19 0338 03/05/19 0420  NA 135 137  K 3.5 3.3*  CL 100 103  CO2 26 24  GLUCOSE 72 95  BUN 9 9  CREATININE 0.47* 0.37*  CALCIUM 9.1 8.8*   PT/INR No results for input(s): LABPROT, INR in the last 72 hours. CMP     Component Value Date/Time   NA 137 03/05/2019 0420   K 3.3 (L) 03/05/2019 0420   CL 103 03/05/2019 0420   CO2 24 03/05/2019 0420   GLUCOSE 95 03/05/2019 0420   BUN 9 03/05/2019 0420   CREATININE 0.37 (L) 03/05/2019 0420   CREATININE 0.78 01/25/2019 1050   CREATININE 1.26 12/01/2012 1700   CALCIUM 8.8 (L) 03/05/2019 0420   PROT 5.2 (L) 02/26/2019 0311   ALBUMIN 2.1 (L) 02/26/2019 0311   AST 13 (L) 02/26/2019 0311   AST 18 01/25/2019 1050   ALT 26 02/26/2019 0311   ALT 59 (H) 01/25/2019 1050   ALKPHOS 65 02/26/2019 0311   BILITOT 0.2 (L) 02/26/2019 0311   BILITOT 0.6 01/25/2019 1050   GFRNONAA >60 03/05/2019 0420   GFRNONAA >60 01/25/2019 1050   GFRAA >60 03/05/2019 0420   GFRAA >60 01/25/2019 1050   Lipase     Component Value Date/Time   LIPASE 19 08/17/2018 1738        Studies/Results: No results found.  Anti-infectives: Anti-infectives (From admission, onward)   Start     Dose/Rate Route Frequency Ordered Stop   02/27/19 1400  piperacillin-tazobactam (ZOSYN) IVPB 3.375 g     3.375 g 12.5 mL/hr over 240 Minutes Intravenous Every 8 hours 02/27/19 1254     02/24/19 2200  ceFEPIme (MAXIPIME) 2 g in sodium chloride 0.9 % 100 mL IVPB  Status:  Discontinued     2 g 200 mL/hr over 30 Minutes Intravenous Every 8 hours 02/24/19 1639 02/27/19 1254   02/14/19 1400  piperacillin-tazobactam (ZOSYN) IVPB 3.375 g  Status:  Discontinued     3.375 g 12.5 mL/hr over 240 Minutes Intravenous Every 8 hours 02/14/19 1342 02/24/19 1639       Assessment/Plan Hx PE/DVT - on Eliquis(last dose 6/9) Dehydration PCM - prealbumin 6.5>>7.7>>16- on TPN Palliative care - DNR Pancytopenia  FTT/ severe deconditioning  - dehydration/electrolyte abnormalities  Type II diabetes Depression   Descending and sigmoid colitis with fluid collections -IRdoesn't feel it is drainablebased on CT scan 6/3 - GI paneland c diffneg for infectious origin - Dr. Rosendo Gros discussed with patientif he would consider surgery, total colectomy with ileostomy, and he states "it would  not be my preference." -colonoscopy performed last week.  Biopsy paths still pending.  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 Pt would be high risk for surgery.      LOS: 25 days    Henreitta Cea , [facility] 03/05/2019, 9:30 AM Pager: [phone]

## 2019-03-05 NOTE — Evaluation (Signed)
Physical Therapy Evaluation Patient Details Name: Mark Frey MRN: [id number] DOB: [birth year] Today's Date: 03/05/2019   History of Present Illness  63 y.o. 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  June 11 patient underwent percutaneous drainage of abscess collection.  Has declined their laparotomy with possible colectomy and ileostomy.  Clinical Impression  Pt admitted with above diagnosis. Pt currently with functional limitations due to the deficits listed below (see PT Problem List).  Pt re-eval'd today after pt had discussion with his wife/MD/CM. Pt agreeable only to OOB. Pt is weaker than on initial PT eval d/t multiple refusals/being uncooperative/unwillign to mobilize with PT. Recommend SNF but question whether pt will be compliant with therapy in SNF setting. Will attempt to follow in acute setting   Pt will benefit from skilled PT to increase their independence and safety with mobility to allow discharge to the venue listed below.       Follow Up Recommendations SNF;Supervision/Assistance - 24 hour    Equipment Recommendations  None recommended by PT(defer to SNF)    Recommendations for Other Services       Precautions / Restrictions Precautions Precautions: Fall Precaution Comments: ileostomy Restrictions Weight Bearing Restrictions: No      Mobility  Bed Mobility Overal bed mobility: Needs Assistance Bed Mobility: Supine to Sit     Supine to sit: Supervision;HOB elevated     General bed mobility comments: for safety, incr time  Transfers Overall transfer level: Needs assistance Equipment used: Rolling walker (2 wheeled) Transfers: Sit to/from [facility] Sit to Stand: Min assist;From elevated surface Stand pivot transfers: Min assist;Mod assist        General transfer comment: pivotal steps to chair, pt refuses to amb stating the reason as he does not have his "flops"  Ambulation/Gait                Stairs            Wheelchair Mobility    Modified Rankin (Stroke Patients Only)       Balance Overall balance assessment: Needs assistance Sitting-balance support: No upper extremity supported;Feet supported Sitting balance-Leahy Scale: Fair     Standing balance support: Bilateral upper extremity supported Standing balance-Leahy Scale: Poor Standing balance comment: reliant on UEs and external assist                             Pertinent Vitals/Pain Pain Assessment: Faces Faces Pain Scale: Hurts little more Pain Location: bil feet with WBing Pain Descriptors / Indicators: Grimacing Pain Intervention(s): Monitored during session    Home Living Family/patient expects to be discharged to:: Skilled nursing facility Living Arrangements: Spouse/significant other Available Help at Discharge: Family;Available 24 hours/day Type of Home: House Home Access: Stairs to enter     Home Layout: One level Home Equipment: Cane - single point;Walker - 4 wheels Additional Comments: from previous admission    Prior Function Level of Independence: Independent with assistive device(s)         Comments: walks with rollator; independent bathing/dressing--prior to admission--pt has had lengthy hospital stay     Hand Dominance        Extremity/Trunk Assessment   Upper Extremity Assessment Upper Extremity Assessment: Generalized weakness    Lower Extremity Assessment Lower Extremity Assessment: Generalized weakness  Communication   Communication: No difficulties  Cognition Arousal/Alertness: Awake/alert Behavior During Therapy: Flat affect   Area of Impairment: Safety/judgement;Following commands                       Following Commands: Follows one step commands with increased  time Safety/Judgement: Decreased awareness of deficits     General Comments: pt is generally uncooperative, willing only to get to chair with PT, multiple previous refusals      General Comments      Exercises     Assessment/Plan    PT Assessment Patient needs continued PT services  PT Problem List Decreased strength;Decreased mobility;Decreased activity tolerance;Decreased knowledge of use of DME;Decreased balance       PT Treatment Interventions DME instruction;Therapeutic activities;Stair training;Gait training;Therapeutic exercise;Functional mobility training;Patient/family education;Wheelchair mobility training    PT Goals (Current goals can be found in the Care Plan section)  Acute Rehab PT Goals Patient Stated Goal: none stated this session PT Goal Formulation: With patient Time For Goal Achievement: 02/15/19 Potential to Achieve Goals: Fair    Frequency Min 2X/week   Barriers to discharge        Co-evaluation               AM-PAC PT "6 Clicks" Mobility  Outcome Measure Help needed turning from your back to your side while in a flat bed without using bedrails?: A Little Help needed moving from lying on your back to sitting on the side of a flat bed without using bedrails?: A Little Help needed moving to and from a bed to a chair (including a wheelchair)?: A Little Help needed standing up from a chair using your arms (e.g., wheelchair or bedside chair)?: A Lot Help needed to walk in hospital room?: A Lot Help needed climbing 3-5 steps with a railing? : Total 6 Click Score: 14    End of Session Equipment Utilized During Treatment: Gait belt Activity Tolerance: Patient limited by fatigue Patient left: in chair;with call bell/phone within reach;with chair alarm set Nurse Communication: Mobility status PT Visit Diagnosis: Muscle weakness (generalized) (M62.81);Difficulty in walking, not elsewhere classified (R26.2)    Time: 9826-4158 PT Time Calculation  (min) (ACUTE ONLY): 13 min   Charges:   PT Evaluation $PT Re-evaluation: 1 Re-eval          Kenyon Ana, PT  Pager: [phone] Acute Rehab Dept [facility]): [phone]   03/05/2019   [facility] 03/05/2019, 12:17 PM

## 2019-03-05 NOTE — TOC Progression Note (Signed)
Transition of Care [facility]) - Progression Note    Patient Details  Name: Mark Frey MRN: [id number] Date of Birth: 02/08/1956  Transition of [facility]) CM/SW Contact  Avanelle Pixley, Juliann Pulse, RN Phone Number: 03/05/2019, 10:28 AM  Clinical Narrative:PT eval ordered-spoke to patient while spouse on phone per patient request-he is in agreement for PT.Ileostomy, has diet,PAC to be capped off @ d/c. Palliative following.Awaiting bx results.Surgery following. D/c plan SNF-[facility]-rep Bryson Ha will get auth after PT note.       Expected Discharge Plan: [place] Barriers to Discharge: Continued Medical Work up(have received insurance [place].)  Expected Discharge Plan and Services Expected Discharge Plan: [place]   Discharge Planning Services: CM Consult Post Acute Care Choice: [place] Living arrangements for the past 2 months: Single Family Home Expected Discharge Date: (unknown)                         HH Arranged: Therapist, sports, PT [place] Agency: [place] ([place]) Date [place]: 02/08/19 Time [place]: 1244 Representative spoke with at [place]: [place] ([place]) Interventions    Readmission Risk Interventions Readmission Risk Prevention Plan 02/08/2019  Transportation Screening Complete  Medication Review Press photographer) Complete  PCP or Specialist appointment within 3-5 days of discharge Complete  HRI or [place] Complete  SW Recovery Care/Counseling Consult Complete  [place] Not Applicable  Some recent data might be hidden

## 2019-03-06 DIAGNOSIS — K5100 Ulcerative (chronic) pancolitis without complications: Secondary | ICD-10-CM | POA: Diagnosis not present

## 2019-03-06 DIAGNOSIS — L89152 Pressure ulcer of sacral region, stage 2: Secondary | ICD-10-CM | POA: Diagnosis not present

## 2019-03-06 DIAGNOSIS — K5720 Diverticulitis of large intestine with perforation and abscess without bleeding: Secondary | ICD-10-CM | POA: Diagnosis not present

## 2019-03-06 DIAGNOSIS — Z741 Need for assistance with personal care: Secondary | ICD-10-CM | POA: Diagnosis not present

## 2019-03-06 DIAGNOSIS — E440 Moderate protein-calorie malnutrition: Secondary | ICD-10-CM | POA: Diagnosis not present

## 2019-03-06 DIAGNOSIS — M6281 Muscle weakness (generalized): Secondary | ICD-10-CM | POA: Diagnosis not present

## 2019-03-06 DIAGNOSIS — K51014 Ulcerative (chronic) pancolitis with abscess: Secondary | ICD-10-CM | POA: Diagnosis not present

## 2019-03-06 DIAGNOSIS — I82411 Acute embolism and thrombosis of right femoral vein: Secondary | ICD-10-CM | POA: Diagnosis not present

## 2019-03-06 DIAGNOSIS — R1030 Lower abdominal pain, unspecified: Secondary | ICD-10-CM | POA: Diagnosis not present

## 2019-03-06 DIAGNOSIS — K56699 Other intestinal obstruction unspecified as to partial versus complete obstruction: Secondary | ICD-10-CM | POA: Diagnosis not present

## 2019-03-06 DIAGNOSIS — E871 Hypo-osmolality and hyponatremia: Secondary | ICD-10-CM | POA: Diagnosis not present

## 2019-03-06 DIAGNOSIS — Z7401 Bed confinement status: Secondary | ICD-10-CM | POA: Diagnosis not present

## 2019-03-06 DIAGNOSIS — R630 Anorexia: Secondary | ICD-10-CM | POA: Diagnosis not present

## 2019-03-06 DIAGNOSIS — D649 Anemia, unspecified: Secondary | ICD-10-CM | POA: Diagnosis not present

## 2019-03-06 DIAGNOSIS — E119 Type 2 diabetes mellitus without complications: Secondary | ICD-10-CM | POA: Diagnosis not present

## 2019-03-06 DIAGNOSIS — M2550 Pain in unspecified joint: Secondary | ICD-10-CM | POA: Diagnosis not present

## 2019-03-06 DIAGNOSIS — C8378 Burkitt lymphoma, lymph nodes of multiple sites: Secondary | ICD-10-CM | POA: Diagnosis not present

## 2019-03-06 DIAGNOSIS — R933 Abnormal findings on diagnostic imaging of other parts of digestive tract: Secondary | ICD-10-CM | POA: Diagnosis not present

## 2019-03-06 DIAGNOSIS — F331 Major depressive disorder, recurrent, moderate: Secondary | ICD-10-CM | POA: Diagnosis not present

## 2019-03-06 DIAGNOSIS — R2689 Other abnormalities of gait and mobility: Secondary | ICD-10-CM | POA: Diagnosis not present

## 2019-03-06 DIAGNOSIS — Z932 Ileostomy status: Secondary | ICD-10-CM | POA: Diagnosis not present

## 2019-03-06 DIAGNOSIS — R1084 Generalized abdominal pain: Secondary | ICD-10-CM | POA: Diagnosis not present

## 2019-03-06 LAB — GLUCOSE, CAPILLARY
Glucose-Capillary: 74 mg/dL (ref 70–99)
Glucose-Capillary: 166 mg/dL — ABNORMAL HIGH (ref 70–99)
Glucose-Capillary: 144 mg/dL — ABNORMAL HIGH (ref 70–99)

## 2019-03-06 LAB — SARS CORONAVIRUS 2 BY RT PCR (HOSPITAL ORDER, PERFORMED IN ~~LOC~~ HOSPITAL LAB): SARS Coronavirus 2: NEGATIVE

## 2019-03-06 MED ORDER — HEPARIN SOD (PORK) LOCK FLUSH 100 UNIT/ML IV SOLN: 500.0000 [IU] | INTRAVENOUS | Status: DC | PRN

## 2019-03-06 MED ORDER — ZINC SULFATE 220 (50 ZN) MG PO CAPS: 220.0000 mg | ORAL_CAPSULE | Freq: Two times a day (BID) | ORAL | 0 refills | Status: AC

## 2019-03-06 MED ORDER — DEXAMETHASONE 1 MG PO TABS: 1.0000 mg | ORAL_TABLET | Freq: Every day | ORAL | 0 refills | Status: AC

## 2019-03-06 MED ORDER — CITALOPRAM HYDROBROMIDE 20 MG PO TABS: 20.0000 mg | ORAL_TABLET | Freq: Every day | ORAL | 0 refills | Status: DC

## 2019-03-06 MED ORDER — ADULT MULTIVITAMIN W/MINERALS CH: 1.0000 | ORAL_TABLET | Freq: Every day | ORAL | 0 refills | Status: AC

## 2019-03-06 MED ORDER — DRONABINOL 5 MG PO CAPS: 5.0000 mg | ORAL_CAPSULE | Freq: Two times a day (BID) | ORAL | 0 refills | Status: DC

## 2019-03-06 MED ORDER — ACETAMINOPHEN 325 MG PO TABS: 650.0000 mg | ORAL_TABLET | Freq: Four times a day (QID) | ORAL | 0 refills | Status: DC | PRN

## 2019-03-06 MED ORDER — CHOLESTYRAMINE LIGHT 4 G PO PACK: 4.0000 g | PACK | Freq: Three times a day (TID) | ORAL | 0 refills | Status: DC

## 2019-03-06 NOTE — Progress Notes (Signed)
Patient ID: Mark Frey, male   DOB: [birth year], 63 y.o.   MRN: [id number] Biopsy results reviewed.  No evidence of malignancy or definitive IBD.  The patient's abdominal exam was relatively benign yesterday and he seems overall improved.  We would NOT recommend proceeding with subtotal colectomy or any type of colectomy at this time.  He will not need surgical follow up at this time given his biopsies do not reveal any surgical pathology.  He may follow up with GI, onc, palliative services, etc.  No further surgical recommendations at this point.  Continue diet as able as he is diverted.  Henreitta Cea 9:19 AM 03/06/2019

## 2019-03-06 NOTE — TOC Transition Note (Signed)
Transition of Care [facility]) - CM/SW Discharge Note   Patient Details  Name: JAKAREE PICKARD MRN: [id number] Date of Birth: Nov 28, 1955  [facility]) CM/SW Contact:  Dessa Phi, RN Phone Number: 03/06/2019, 4:12 PM   Clinical Narrative: PTAR called. No further CM needs.      Final next level of care: Skilled Nursing Facility Barriers to Discharge: No Barriers Identified   Patient Goals and CMS Choice Patient states their goals for this hospitalization and ongoing recovery are:: go home CMS Medicare.gov Compare Post Acute Care list provided to:: Patient Represenative (must comment) Choice offered to / list presented to : Spouse  Discharge Placement              Patient chooses bed at: [facility]) Patient to be transferred to facility by: [facility] Name of family member notified: [name] spouse Patient and family notified of of transfer: 03/06/19  Discharge Plan and Services   Discharge Planning Services: CM Consult Post Acute Care Choice: [place]: RN, PT [facility] Agency: [place] ([place]) Date [place] [facility] Contacted: 02/08/19 Time [place]: 1244 Representative spoke with at [place]: [place] Determinants of Health ([place]) Interventions     Readmission Risk Interventions Readmission Risk Prevention Plan 02/08/2019  Transportation Screening Complete  Medication Review Press photographer) Complete  PCP or Specialist appointment within 3-5 days of discharge Complete  HRI or Home Care Consult Complete  SW Recovery Care/Counseling Consult Complete  [place] Not Applicable  Some recent data might be hidden

## 2019-03-06 NOTE — TOC Progression Note (Signed)
Transition of Care [facility]) - Progression Note    Patient Details  Name: RAYNELL SCOTT MRN: [id number] Date of Birth: 1956-02-24  Transition of [facility]) CM/SW Contact  Kaesha Kirsch, Juliann Pulse, RN Phone Number: 03/06/2019, 3:41 PM  Clinical Narrative: Awaiting COVID test results prior to d/c to [facility]. Nsg will call PTAR once ready.      Expected Discharge Plan: [place] Barriers to Discharge: No Barriers Identified  Expected Discharge Plan and Services Expected Discharge Plan: [place]   Discharge Planning Services: CM Consult Post Acute Care Choice: [place] Living arrangements for the past 2 months: Single Family Home Expected Discharge Date: 03/06/19                         HH Arranged: RN, PT [facility] Agency: [place] ([place]) Date [place]: 02/08/19 Time [place]: 1244 Representative spoke with at [place]: [place] ([place]) Interventions    Readmission Risk Interventions Readmission Risk Prevention Plan 02/08/2019  Transportation Screening Complete  Medication Review Press photographer) Complete  PCP or Specialist appointment within 3-5 days of discharge Complete  HRI or [place] Complete  SW Recovery Care/Counseling Consult Complete  [place] Not Applicable  Some recent data might be hidden

## 2019-03-06 NOTE — Progress Notes (Signed)
Patient discharged from hospital via [place] to [facility]. Patient in no distress at time of discharge. [name], spouse, called and given update.

## 2019-03-06 NOTE — Progress Notes (Signed)
Nutrition Follow-up  RD working remotely.   DOCUMENTATION CODES:   Non-severe (moderate) malnutrition in context of chronic illness  INTERVENTION:  - continue to encourage PO intakes at SNF.    NUTRITION DIAGNOSIS:   Increased nutrient needs related to chronic illness, cancer and cancer related treatments as evidenced by estimated needs. -ongoing  GOAL:   Patient will meet greater than or equal to 90% of their needs -unmet on average  MONITOR:   PO intake, Supplement acceptance, Diet advancement, Labs, Weight trends, Skin, Other (Comment)(TPN regimen)  ASSESSMENT:   63 y.o. male with medical history significant of Burkitt's lymphoma s/p radiation and chemotherapy, s/p ileostomy, Type 2 DM, radiation colitis, and PE/DVT on Eliquis. He presented to the ED with 3 week history of worsening weakness. At baseline, he is able to ambulate independently with a walker but more recently was unable to stand up on his own. The patient and his wife went to his PCP on 5/26 to discuss decline and to assess capacity. Patient, at that time was considering hospice referral, but has now opted for acute medical treatment.   Patient has not been weighed since 6/18. Discharge order and discharge summary for discharge to SNF entered earlier today. No inpatient needs at this time. Will continue to monitor if patient unable to d/c today.    Medications reviewed; daily multivitamin with minerals, 220 mg oral zinc sulfate BID.  Labs reviewed; CBGs: 74 and 166 mg/dl today, K: 3.3 mmol/l, creatinine: 0.37 mg/dl.       Diet Order:   Diet Order            Diet - low sodium heart healthy        Diet regular Room service appropriate? Yes; Fluid consistency: Thin  Diet effective now              EDUCATION NEEDS:   Not appropriate for education at this time  Skin:  Skin Assessment: Skin Integrity Issues: Skin Integrity Issues:: Stage II Stage II: sacrum (new: 5/30)  Last BM:  150 ml  6/23  Height:   Ht Readings from Last 1 Encounters:  03/01/19 5' 9"  (1.753 m)    Weight:   Wt Readings from Last 1 Encounters:  03/01/19 69 kg    Ideal Body Weight:  72.7 kg  BMI:  Body mass index is 22.46 kg/m.  Estimated Nutritional Needs:   Kcal:  2300-2500 kcal  Protein:  115-125 grams  Fluid:  >/= 2.2 L/day     Jarome Matin, MS, RD, LDN, [facility] Inpatient Clinical Dietitian Pager # [phone] After hours/weekend pager # [phone]

## 2019-03-06 NOTE — Discharge Summary (Addendum)
Physician Discharge Summary  Mark Frey WLS:[id number] DOB: [birth year] DOA: 02/07/2019  PCP: [name], MD  Admit date: 02/07/2019 Discharge date: 03/06/2019  Admitted From: Home  Disposition:  SNF  Recommendations for Outpatient Follow-up and new medication changes:  1. Follow up with Dr. Yong Channel in 7 days.  2. Follow up with the oncology clinic as scheduled.  3. Follow up with GI clinic as scheduled.   Home Health: na   Equipment/Devices: na    Discharge Condition: stable  CODE STATUS: dnr   Diet recommendation: regular   Brief/Interim Summary: 63 yo male who presented with weakness. 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 Patient developed worsening weakness for several weeks,tothe point where he was not able to stand by his own. On his initial physical examination his temperature was 99, heart rate 110, respiratory rate 18, blood pressure 104/74, oxygen saturation 100%. His lungswereclear to auscultation bilaterally, heart S1-S2 present rhythmic, abdomen was soft, nontender, no lower extremity edema, patient had weakness on the left lower extremity compared to the right. His affect was flat.  Patient was admitted to the hospital working diagnosis of dehydration complicated by anion gap metabolic acidosis and metabolic encephalopathy.  Patient continued to deteriorate, he was placed on TPN for nutrition. CT of the abdomen and pelvis showed multiple fluid collections along the course of the descending sigmoid colon consistent with developing abscess. Patient was placed on IV antibiotic therapy and surgery was consulted.  June 11 patient underwent percutaneous drainage of abscess collection. Initial surgery recommendation was laparotomy with possible colectomy and ileostomy, that he declined.   June 18, Underwent  colonoscopy finding severe colitis, biopsies performed.  June 20, patient's diet was advance to regular, with good toleration.  1.Multiple abdominal abscesses/ pseudomonas infection/ present on admission/ colitis.   CT from 06.08.20 with persistent pancolitis, possible communicating lesion between the ileum and sigmoid, less prominent fluid collection at the sigmoid colon.   02/22/19 aspiration of anterior and lateral proximal sigmoid collection, 4 ml bloody and turbid fluid, culture positive for pseudomonas and morganella.   CT from 02/27/19 stable to slightly decreased fluid collection surrounding the sigmoid colon and descending colon.  Persistent diffuse wall thickening of virtually all of the visualized colon consistent with colitis.  Interval decrease in size of the fluid collection along the left lateral sidewall.  Patient was admitted to the hospital, he received  broad spectrum antibiotic with initially cefepime and posteriorly Zosyn, for a complete 25 days of therapy.  His diet was successfully advanced to regular, and his TPN was discontinued.  Patient underwent further work-up with colonoscopy, the procedure was not fully completed due to severe sigmoid colon stricture, it was found severe stricture and severe colitis in the sigmoid colon, the mucose of the rectum was erythematous, mild diverticulosis of the sigmoid colon with no bleeding, small internal hemorrhoids, congested, erythematous and diffusely inflamed mucosa noted in one segment the loop ileostomy the other segment of the loop ileostomy appeared normal.  Biopsies were taken, which resulted chronic active inflammation, no granulomatous disease, no signs of lymphoma.  Patient currently starting well p.o. diet, he has been placed on dexamethasone as an outpatient to increase his appetite.  Follow-up with the oncology and GI clinic.  2.  High-grade B-cell lymphoma.  On remission.  3.  Type 2 diabetes mellitus with  hypoglycemia.  Now on a liberal diet,  continue capillary glucose monitoring, off insulin to avoid hypoglycemia.  4.  Calorie protein malnutrition.  Patient has received nutritional supplements, dexamethasone total improve appetite.  5.  Right lower extremity deep vein thrombosis with bilateral pulmonary embolism.  His anticoagulation was held during his hospitalization due to potential risk of gastrointestinal bleeding.  Fortunately no bleeding was noted, he will resume apixaban.  6. Sacrum stage II pressure ulcer, present on admission.  Continue local wound care.  7.  Multifactorial anemia.  His hemoglobin and hematocrit over the last 7 days of hospitalization has remained stable.  8. Depression. Patient has been started on citalopram.   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)   Deep vein thrombosis (DVT) of femoral vein of right lower extremity (HCC)   Diabetes mellitus (HCC)   Bowel obstruction in setting of lymphoma and sigmoid stricture   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HCC)   Hyponatremia   Malnutrition of moderate degree   Dehydration   Palliative care by specialist   Goals of care, counseling/discussion   Symptomatic anemia   Atrophy of muscle of multiple sites   Depression   Abnormal CT scan, gastrointestinal tract   Thrombocytopenia (HCC)   Abscess of sigmoid colon   Loop ilesotomy for fecal diversion of sigmoid stricture   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   Procedure: Blood Product(s)   Type and screen   Complete by: As directed    [place]      Allergies as of 03/06/2019      Reactions   Ciprofloxacin Other (See Comments)   Leg tingling      Medication List    STOP taking these medications   B-complex with vitamin C tablet   feeding supplement (ENSURE ENLIVE) Liqd   feeding supplement (PRO-STAT SUGAR FREE 64) Liqd   Magnesium 500 MG Caps     TAKE these medications   acetaminophen 325 MG tablet Commonly known as: TYLENOL Take 2 tablets (650 mg total) by mouth every 6 (six) hours as needed for mild pain (or Fever >/= 101).   apixaban 5 MG Tabs tablet Commonly known as: Eliquis DO NOT TAKE UNTIL SEEN BY YOUR ONCOLOGIST What changed:   how much to take  how to take this  when to take this   blood glucose meter kit and supplies Dispense based on patient and insurance preference. Use daily as directed. (E11.9).   citalopram 20 MG tablet Commonly known as: CELEXA Take 1 tablet (20 mg total) by mouth daily for 30 days. Start taking on: March 07, 2019   dexamethasone 1 MG tablet Commonly known as: DECADRON Take 1 tablet (1 mg total) by mouth daily with breakfast for 30 days. Start taking on: March 07, 2019 What changed:   medication strength  how much to take   dronabinol 5 MG capsule Commonly known as: MARINOL Take 1 capsule (5 mg total) by mouth 2 (two) times daily before lunch and supper.   glucose blood test strip Commonly known as: FREESTYLE TEST STRIPS Use to check blood sugar daily   multivitamin with minerals Tabs tablet Take 1 tablet by mouth daily for 30 days.   ondansetron 4 MG tablet Commonly known as: ZOFRAN Take 1 tablet (4 mg total) by mouth every 6 (six) hours  as needed for nausea.   zinc sulfate 220 (50 Zn) MG capsule Take 1 capsule (220 mg total) by mouth 2 (two) times daily for 30 days.      Follow-up Information    [facility].   Specialty: Home Health Services Why: HH nursing/physical therapy         Allergies  Allergen Reactions  . Ciprofloxacin Other (See Comments)    Leg tingling    Consultations:  Surgery   Palliative care  GI    Procedures/Studies: Ct Abdomen Pelvis W Contrast  Result Date: 02/27/2019 CLINICAL DATA:  Multiple fluid collections along the course of the descending colon consistent with developing abscesses. Fever. Abdominal pain. EXAM: CT ABDOMEN AND PELVIS WITH CONTRAST TECHNIQUE: Multidetector CT imaging of the abdomen and pelvis was performed using the standard protocol following bolus administration of intravenous contrast. CONTRAST:  170m OMNIPAQUE IOHEXOL 300 MG/ML  SOLN COMPARISON:  02/19/2019 FINDINGS: Lower chest: No acute abnormality. Hepatobiliary: There are few hypoattenuating nodules in the liver which are stable from prior study and are likely of no clinical significance. There is mild gallbladder wall thickening. Pancreas: Unremarkable. No pancreatic ductal dilatation or surrounding inflammatory changes. Spleen: Normal in size without focal abnormality. Adrenals/Urinary Tract: There are nonobstructing stones in the lower pole the right kidney. There is no hydronephrosis. Left kidney is unremarkable. The adrenal glands are unremarkable. The urinary bladder is tethered to the sigmoid colon. Stomach/Bowel: Again identified are fluid collections along the descending colon and sigmoid colon. These fluid collections are stable to slightly decreased in size when compared to prior study. There is diffuse wall thickening of the nearby sigmoid colon and descending colon. The descending colon is tethered to the abdominal wall. In this location the fluid collection has decreased in  size. A right lower quadrant ostomy is again noted. There is no evidence of a small-bowel obstruction. Vascular/Lymphatic: Aortic atherosclerosis. No enlarged abdominal or pelvic lymph nodes. Reproductive: Prostate is unremarkable. Other: There are small fat containing bilateral inguinal hernias. There is a small fat containing periumbilical hernia. Musculoskeletal: No acute or significant osseous findings. IMPRESSION: 1. Stable to slightly decreased fluid collection surrounding the sigmoid colon and descending colon. 2. Persistent diffuse wall thickening of virtually all of the visualized colon consistent with colitis. A more nodular appearance of the sigmoid colon is of unknown clinical significance. Underlying tumor is not entirely excluded and would need to be correlated with colonoscopy. 3. The urinary bladder is tender to the sigmoid colon. There are multiple small bowel loops in the left lower quadrant that are tethered to the sigmoid colon. 4. Interval decrease in size of the fluid collection along the left lateral sidewall. The bowel remains tethered in this location. 5. Unchanged nonobstructing stone in the lower pole of the right kidney. 6. Mild wall thickening of the gallbladder. If there is clinical suspicion for acute cholecystitis, follow-up with ultrasound is recommended. Electronically Signed   By: CConstance HolsterM.D.   On:  02/27/2019 19:51   Ct Abdomen Pelvis W Contrast  Result Date: 02/19/2019 CLINICAL DATA:  Burkitt's lymphoma. Failure to thrive. Fluid collections along the colon. Post bowel resection EXAM: CT ABDOMEN AND PELVIS WITH CONTRAST TECHNIQUE: Multidetector CT imaging of the abdomen and pelvis was performed using the standard protocol following bolus administration of intravenous contrast. CONTRAST:  156m OMNIPAQUE IOHEXOL 300 MG/ML  SOLN COMPARISON:  CT 02/14/2019 FINDINGS: Lower chest: No pneumonia at the lung bases. Hepatobiliary: No focal hepatic lesion. No hepatic abscess.  Gallbladder normal. Pancreas: Pancreas is normal. No ductal dilatation. No pancreatic inflammation. Spleen: Normal spleen Adrenals/urinary tract: Adrenal glands normal. Small hypodense lesions in the kidney likely represent benign cysts no evidence of pyelonephritis. No hydronephrosis bladder normal Stomach/Bowel: Stomach, duodenum normal. Loop ileostomy in the mid RIGHT abdomen. Towards the terminal ileum the last 6 cm of small bowel demonstrate mucosal enhancement and fluid within lumen. The ascending colon demonstrates thickened nodular mucosa. There is no fluid collection surrounding the ascending colon. The transverse colon is mildly thick-walled with enhancement. The descending colon does demonstrate a pericolonic fluid collection beneath the serosal surface. There is adherence of the descending colon to the lateral abdominal wall in the mid descending colon (image 41/2). This is contiguous with the fluid collection surrounding the descending colon and proximal sigmoid colon which may communicate with the distal ileum at several locations (image 62/5 and image 89/5. Rectum normal. The fluid collection extending along the descending colon is similar to comparison exam. The fluid collection at the junction of the sigmoid colon and small bowel is decreased in volume. There is no clear evidence of extraluminal abscess. No evidence of perforation. Vascular/Lymphatic: Abdominal aorta is normal caliber. No periportal or retroperitoneal adenopathy. No pelvic adenopathy. Reproductive: Prostate normal Other: No free fluid. Musculoskeletal: No aggressive osseous lesion. IMPRESSION: 1. Persistent mucosal thickening and enhancement involving the entire colon consistent with pancolitis. 2. Fluid within the bowel wall of the descending colon appears to communicate with the distal ileum ileum at the level the sigmoid colon. The fluid collection is less prominent at the level of the sigmoid colon compared to exam 5 days prior.  3. Serosal surface of the descending colon is adherent to the lateral abdominal wall peritoneal surface. 4. No intraperitoneal free air.  No extraluminal abscess. Electronically Signed   By: SSuzy BouchardM.D.   On: 02/19/2019 15:56   Ct Abdomen Pelvis W Contrast  Result Date: 02/14/2019 CLINICAL DATA:  Left-sided abdominal pain for several days EXAM: CT ABDOMEN AND PELVIS WITH CONTRAST TECHNIQUE: Multidetector CT imaging of the abdomen and pelvis was performed using the standard protocol following bolus administration of intravenous contrast. CONTRAST:  1079mOMNIPAQUE 300 COMPARISON:  01/01/2019 FINDINGS: Lower chest: Mild basilar atelectasis is noted. Hepatobiliary: Stable in appearance from the recent exam. Pancreas: Unremarkable. No pancreatic ductal dilatation or surrounding inflammatory changes. Spleen: Normal in size without focal abnormality. Adrenals/Urinary Tract: Adrenal glands are within normal limits. Scattered small cysts are noted bilaterally. A nonobstructing right renal stone is seen. The ureters are within normal limits. The bladder is partially compressed. Stomach/Bowel: The colon again demonstrates diffuse wall thickening consistent with colitis. There is a small fluid collection identified adjacent to the mid descending colon which measures approximately 2 x 1.5 cm in greatest dimension. A slightly larger fluid collection is noted surrounding sigmoid colon and extending towards the cecum in the right hemipelvis. This lies in an area of previously seen air-filled pockets likely representing a confluent abscess. This measures at  least 8 cm in greatest transverse dimension but measures only approximately 2.6 cm in AP dimension. More proximal small bowel is within normal limits. Loop ileostomy is noted in the right mid abdomen. Vascular/Lymphatic: Aortic atherosclerosis. No enlarged abdominal or pelvic lymph nodes. Reproductive: Prostate is unremarkable. Other: No abdominal wall hernia or  abnormality. No abdominopelvic ascites. Musculoskeletal: No acute or significant osseous findings. IMPRESSION: Multifocal fluid collections along the course of the descending and sigmoid colon as described consistent with developing abscesses. The largest of these lies along the margin of the sigmoid colon as described above. These changes are new from prior exam. Nonobstructing right renal stone stable from the prior exam. These results will be called to the ordering clinician or representative by the Radiologist Assistant, and communication documented in the PACS or zVision Dashboard. Electronically Signed   By: Inez Catalina M.D.   On: 02/14/2019 12:48   Ct Aspiration  Result Date: 02/22/2019 INDICATION: Pancolitis and history of high-grade lymphoma. Slowly improving pericolonic fluid collections and request for aspiration of one of the collections for diagnostic purposes as an infectious source has not been able to be isolated by other means. EXAM: CT-GUIDED ASPIRATION OF LEFT LOWER QUADRANT PERITONEAL FLUID COLLECTION MEDICATIONS: The patient is currently admitted to the hospital and receiving intravenous antibiotics. The antibiotics were administered within an appropriate time frame prior to the initiation of the procedure. ANESTHESIA/SEDATION: Fentanyl 100 mcg IV; Versed 2.0 mg IV Moderate Sedation Time:  15 minutes. The patient was continuously monitored during the procedure by the interventional radiology nurse under my direct supervision. COMPLICATIONS: None immediate. PROCEDURE: Informed written consent was obtained from the patient after a thorough discussion of the procedural risks, benefits and alternatives. All questions were addressed. Maximal Sterile Barrier Technique was utilized including caps, mask, sterile gowns, sterile gloves, sterile drape, hand hygiene and skin antiseptic. A timeout was performed prior to the initiation of the procedure. CT was performed through the abdomen and pelvis in  a supine position. After choosing a site for aspiration, the left lower abdominal wall was prepped with chlorhexidine and local anesthesia was provided with 1% lidocaine. An 18 gauge trocar needle was advanced under CT guidance to the level of a pericolonic fluid collection anterior and lateral to the proximal sigmoid colon. Aspiration was performed. A fluid sample was obtained and sent for culture analysis including a aerobic/anaerobic culture and fungal culture. FINDINGS: The tiny collection lateral to the descending colon appears smaller and was felt to be too small to obtain any diagnostic fluid from. There is some persistent fluid anterior and lateral to the proximal sigmoid colon that was targeted. There was return of 4 mL of bloody and turbid fluid from this area. IMPRESSION: CT-guided aspiration of pericolonic fluid adjacent to the sigmoid colon yielding 4 mL of bloody and turbid fluid. The fluid sample was sent for culture analysis. Electronically Signed   By: Aletta Edouard M.D.   On: 02/22/2019 10:58   [place] Ekg Site Rite  Result Date: 02/19/2019 If Site Rite image not attached, placement could not be confirmed due to current cardiac rhythm.     Procedures: Abdominal collection aspiration                         Colonoscopy   Subjective: Patient is feeling better, no nausea or vomiting, tolerating po well.   Discharge Exam: Vitals:   03/05/19 2146 03/06/19 0430  BP: 108/68 105/76  Pulse: 70 68  Resp: 16  16  Temp: 98.7 F (37.1 C) 98.1 F (36.7 C)  SpO2: 99% 100%   Vitals:   03/05/19 0450 03/05/19 1300 03/05/19 2146 03/06/19 0430  BP: 108/71 112/69 108/68 105/76  Pulse: 68 71 70 68  Resp: _0 Temp: 98.5 F (36.9 C) 98.7 F (37.1 C) 98.7 F (37.1 C) 98.1 F (36.7 C)  TempSrc: Oral Oral Oral Oral  SpO2: 100% 99% 99% 100%  Weight:      Height:        General: Not in pain or dyspnea  Neurology: Awake and alert, non focal  E ENT: no pallor, no icterus, oral  mucosa moist Cardiovascular: No JVD. S1-S2 present, rhythmic, no gallops, rubs, or murmurs. No lower extremity edema. Pulmonary: positive breath sounds bilaterally, adequate air movement, no wheezing, rhonchi or rales. Gastrointestinal. Abdomen with no organomegaly, non tender, no rebound or guarding Skin. No rashes Musculoskeletal: no joint deformities   The results of significant diagnostics from this hospitalization (including imaging, microbiology, ancillary and laboratory) are listed below for reference.     Microbiology: No results found for this or any previous visit (from the past 240 hour(s)).   Labs: BNP (last 3 results) No results for input(s): BNP in the last 8760 hours. Basic Metabolic Panel: Recent Labs  Lab 02/28/19 0325 03/01/19 0333 03/03/19 0338 03/05/19 0420  NA 137 133* 135 137  K 4.1 3.6 3.5 3.3*  CL 102 98 100 103  CO2 _1 GLUCOSE 157* 105* 72 95  BUN _2 CREATININE 0.32* 0.41* 0.47* 0.37*  CALCIUM 8.7* 8.9 9.1 8.8*   Liver Function Tests: No results for input(s): AST, ALT, ALKPHOS, BILITOT, PROT, ALBUMIN in the last 168 hours. No results for input(s): LIPASE, AMYLASE in the last 168 hours. No results for input(s): AMMONIA in the last 168 hours. CBC: Recent Labs  Lab 02/28/19 0325 03/01/19 0333 03/02/19 0340 03/03/19 0338 03/05/19 0420  WBC 3.2* 4.2 3.0* 2.9* 3.1*  NEUTROABS 2.3 3.0 1.8 1.9 1.9  HGB 7.8* 8.1* 7.8* 8.4* 8.1*  HCT 25.5* 26.2* 25.7* 27.6* 26.4*  MCV 100.8* 101.2* 99.6 101.5* 102.3*  PLT 138* 147* 129* 139* 146*   Cardiac Enzymes: No results for input(s): CKTOTAL, CKMB, CKMBINDEX, TROPONINI in the last 168 hours. BNP: Invalid input(s): POCBNP CBG: Recent Labs  Lab 03/05/19 0830 03/05/19 1159 03/05/19 1640 03/05/19 2142 03/06/19 0747  GLUCAP 79 180* 133* 131* 74   D-Dimer No results for input(s): DDIMER in the last 72 hours. Hgb A1c No results for input(s): HGBA1C in the last 72 hours. Lipid  Profile No results for input(s): CHOL, HDL, LDLCALC, TRIG, CHOLHDL, LDLDIRECT in the last 72 hours. Thyroid function studies No results for input(s): TSH, T4TOTAL, T3FREE, THYROIDAB in the last 72 hours.  Invalid input(s): FREET3 Anemia work up No results for input(s): VITAMINB12, FOLATE, FERRITIN, TIBC, IRON, RETICCTPCT in the last 72 hours. Urinalysis    Component Value Date/Time   COLORURINE YELLOW 02/07/2019 1652   APPEARANCEUR CLEAR 02/07/2019 1652   LABSPEC 1.024 02/07/2019 1652   PHURINE 6.0 02/07/2019 1652   GLUCOSEU 50 (A) 02/07/2019 1652   HGBUR NEGATIVE 02/07/2019 1652   HGBUR negative 01/20/2009 0840   BILIRUBINUR NEGATIVE 02/07/2019 1652   BILIRUBINUR Small 01/30/2018 [place] 02/07/2019 1652   PROTEINUR 30 (A) 02/07/2019 1652   UROBILINOGEN 2.0 (A) 01/30/2018 1652   UROBILINOGEN 0.2 01/20/2009 0840   NITRITE NEGATIVE 02/07/2019 1652   LEUKOCYTESUR  NEGATIVE 02/07/2019 1652   Sepsis Labs Invalid input(s): PROCALCITONIN,  WBC,  LACTICIDVEN Microbiology No results found for this or any previous visit (from the past 240 hour(s)).   Time coordinating discharge: 45 minutes  SIGNED:   Tawni Millers, MD  [facility] 03/06/2019, 11:00 AM

## 2019-03-06 NOTE — TOC Transition Note (Signed)
Transition of Care [facility]) - CM/SW Discharge Note   Patient Details  Name: ZURICH CARRENO MRN: [id number] Date of Birth: 10/17/1955  Transition of Care [facility]) CM/SW Contact:  Dessa Phi, RN Phone Number: 03/06/2019, 12:37 PM   Clinical Narrative: [facility] Florentina Jenny aware of d/c, spouse [name] aware also. Nurse to call report to [phone]. Will call PTAR once ready-forms in chart with [facility].      Final next level of care: Skilled Nursing Facility Barriers to Discharge: No Barriers Identified   Patient Goals and CMS Choice Patient states their goals for this hospitalization and ongoing recovery are:: go home CMS Medicare.gov Compare Post Acute Care list provided to:: Patient Represenative (must comment) Choice offered to / list presented to : Spouse  Discharge Placement              Patient chooses bed at: [facility]) Patient to be transferred to facility by: [place] Name of family member notified: [name] spouse Patient and family notified of of transfer: 03/06/19  Discharge Plan and Services   Discharge Planning Services: CM Consult Post Acute Care Choice: [place]: RN, PT [facility] Agency: [place] ([place]) Date [facility] Agency Contacted: 02/08/19 Time [place]: 1244 Representative spoke with at [place]: [place] Determinants of Health ([place]) Interventions     Readmission Risk Interventions Readmission Risk Prevention Plan 02/08/2019  Transportation Screening Complete  Medication Review Press photographer) Complete  PCP or Specialist appointment within 3-5 days of discharge Complete  HRI or Home Care Consult Complete  SW Recovery Care/Counseling Consult Complete  [place] Not Applicable  Some recent data might be hidden

## 2019-03-06 NOTE — Progress Notes (Addendum)
Patient ID: ABDULHAMID OLGIN, male   DOB: 1956-07-01, 63 y.o.   MRN: [id number]    Progress Note   Subjective    Hospital day # 28 Pt denies any abdominal pain, does not want to continue Questran , feels ileostomy output  Appropriate   With po intake  Patient related he did not want to have surgery  Biopsies reviewed per Dr Lyndel Safe who discussed with pt Ileal efferent-mild chronic active inflammation and focal erosion no evidence for lymphoma Ileal afferent-minimal active chronic inflammation Colon biopsy/sigmoid-colonic mucosa with exudate consistent with ulcer, no evidence for lymphoma Rectal biopsies-no active inflammation  Continues on cyclic TPN Continues on IV Zosyn-to be discontinued today       Objective   Vital signs in last 24 hours: Temp:  [98.1 F (36.7 C)-98.7 F (37.1 C)] 98.1 F (36.7 C) (06/23 0430) Pulse Rate:  [68-71] 68 (06/23 0430) Resp:  [16] 16 (06/23 0430) BP: (105-112)/(68-76) 105/76 (06/23 0430) SpO2:  [99 %-100 %] 100 % (06/23 0430) Last BM Date: 03/05/19 General:    White male in NAD- not further examined today   Intake/Output from previous day: 06/22 0701 - 06/23 0700 In: 962.8 [P.O.:480; IV Piggyback:482.8] Out: 875 [Urine:300; Stool:575] Intake/Output this shift: No intake/output data recorded.  Lab Results: Recent Labs    03/05/19 0420  WBC 3.1*  HGB 8.1*  HCT 26.4*  PLT 146*   BMET Recent Labs    03/05/19 0420  NA 137  K 3.3*  CL 103  CO2 24  GLUCOSE 95  BUN 9  CREATININE 0.37*  CALCIUM 8.8*   LFT No results for input(s): PROT, ALBUMIN, AST, ALT, ALKPHOS, BILITOT, BILIDIR, IBILI in the last 72 hours. PT/INR No results for input(s): LABPROT, INR in the last 72 hours.       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  He has completed radiation and chemotherapy and is felt to be in remission   Status post diverting loop ileostomy 08/2018 for severe sigmoid stricture with obstruction  #2 acute colitis onset immediately after completing radiation April 2020-It is been unclear whether colitis is primarily radiation-induced versus diversion colitis.  Management very similar.  He has been on short-chain fatty acids.  Patient developed left pericolonic abscesses, status post aspiration 02/22/2019 cultures grew Morganella and Pseudomonas and he is completing a long course of Zosyn Last CT scan 02/27/2019 showed improvement in the fluid collections along the descending colon and sigmoid colon, but with persistent diffuse wall thickening of the colon.  Colonoscopy and ileoscopy 02/27/2019--multiple biopsies as outlined above show nonspecific mild chronic active inflammation from the ileal biopsies, and sigmoid biopsies at the level of the stricture showed exudate consistent with ulcer but no lymphoma.   Overall picture of failure to thrive has been gradually improving. Patient continues on cyclic TPN, and oral intake has been better.  He does not want to have surgery, and if surgery were to be done would require colectomy.  There is no other specific treatment for acute radiation colitis could consider low-dose steroids though he is a poor candidate for that and would not do at present given recent micro perforation with abscesses.  Will defer duration of TPN to oncology and medicine. Though patient does not want to stay on Questran would continue Questran 4 g and since ounces of liquid at least once daily. 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)   Deep vein thrombosis (DVT) of femoral vein of right lower extremity  (HCC)   Diabetes mellitus (HCC)   Bowel obstruction in setting of lymphoma and sigmoid stricture   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HCC)   Hyponatremia   Malnutrition of moderate degree   Dehydration   Palliative care by specialist   Goals of care, counseling/discussion   Symptomatic anemia   Atrophy of muscle of multiple sites   Depression   Abnormal CT scan, gastrointestinal tract   Thrombocytopenia (HCC)   Abscess of sigmoid colon   Loop ilesotomy for fecal diversion of sigmoid stricture   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 I agree with the Advanced Practitioner's note, impression and recommendations.  63 year old with H/O Burkitt's lymphoma with colonic invasion s/p chem/XRT complicated by sigmoid obstruction requiring diverting loop ileostomy 08/2018, L pericolonic abscess s/p aspiration with moderately severe sigmoid colitis with stricture on colon 02/27/2019 (D/d radiation-induced vs diversion colitis).  Has been on multiple medications including SCFA without improvement. Refuses surgery/GI intervention.  Doing very well.  Tolerating p.o. well.  Wants to hold off on cholestyramine.  Continue supportive therapy No GI intervention planned. Encourage p.o. TPN per oncology. Low-dose p.o. steroids as an option but may increase risk of infections. Will sign off for now. Please call if with any questions or if we could be of any help.     Carmell Austria, MD [facility] [phone].

## 2019-03-06 NOTE — Progress Notes (Signed)
Received consult to remove PICC and de-access port. Flushes ordered and scanned per "prior to discharge protocol". Found pt's port had already been de-accessed. No heparin administered by this RN.

## 2019-03-07 ENCOUNTER — Telehealth: Payer: Self-pay | Admitting: Gastroenterology

## 2019-03-07 NOTE — Telephone Encounter (Signed)
Please see notes below. Pts wife is calling wanting to know results of colon.

## 2019-03-07 NOTE — Telephone Encounter (Signed)
He is an inpatient at [facility]. Dr. Garrel Ridgel and Nicoletta Ba are covering [facility].

## 2019-03-07 NOTE — Telephone Encounter (Signed)
Pts wife is calling for results of Colon done in the hospital on 03/01/19. Please advise.

## 2019-03-07 NOTE — Telephone Encounter (Signed)
I called pt's wife and reviewed Colonoscopy findings and Bx . They will plan to follow up with Dr Ardis Hughs in a month- pt will be in rehab for several weeks

## 2019-03-07 NOTE — Telephone Encounter (Signed)
Patient wife called would like to know about the results of the endo that was on 6-18 by Dr. Fuller Plan

## 2019-03-08 ENCOUNTER — Telehealth: Payer: Self-pay | Admitting: Hematology

## 2019-03-08 ENCOUNTER — Other Ambulatory Visit: Payer: Self-pay | Admitting: *Deleted

## 2019-03-08 DIAGNOSIS — I82411 Acute embolism and thrombosis of right femoral vein: Secondary | ICD-10-CM | POA: Diagnosis not present

## 2019-03-08 DIAGNOSIS — C8378 Burkitt lymphoma, lymph nodes of multiple sites: Secondary | ICD-10-CM

## 2019-03-08 DIAGNOSIS — E440 Moderate protein-calorie malnutrition: Secondary | ICD-10-CM | POA: Diagnosis not present

## 2019-03-08 DIAGNOSIS — E119 Type 2 diabetes mellitus without complications: Secondary | ICD-10-CM | POA: Diagnosis not present

## 2019-03-08 NOTE — Telephone Encounter (Signed)
Scheduled appt per 6/25 sch message - spoke with wife - she is aware of appt and states she will contact Rehab about transportation.

## 2019-03-09 DIAGNOSIS — C8378 Burkitt lymphoma, lymph nodes of multiple sites: Secondary | ICD-10-CM | POA: Diagnosis not present

## 2019-03-09 DIAGNOSIS — E119 Type 2 diabetes mellitus without complications: Secondary | ICD-10-CM | POA: Diagnosis not present

## 2019-03-09 DIAGNOSIS — R630 Anorexia: Secondary | ICD-10-CM | POA: Diagnosis not present

## 2019-03-09 DIAGNOSIS — E440 Moderate protein-calorie malnutrition: Secondary | ICD-10-CM | POA: Diagnosis not present

## 2019-03-09 NOTE — Telephone Encounter (Signed)
Thanks for taking care of him RG

## 2019-03-13 DIAGNOSIS — L89152 Pressure ulcer of sacral region, stage 2: Secondary | ICD-10-CM | POA: Diagnosis not present

## 2019-03-14 DIAGNOSIS — R630 Anorexia: Secondary | ICD-10-CM | POA: Diagnosis not present

## 2019-03-14 DIAGNOSIS — C8378 Burkitt lymphoma, lymph nodes of multiple sites: Secondary | ICD-10-CM | POA: Diagnosis not present

## 2019-03-14 DIAGNOSIS — E119 Type 2 diabetes mellitus without complications: Secondary | ICD-10-CM | POA: Diagnosis not present

## 2019-03-14 DIAGNOSIS — E440 Moderate protein-calorie malnutrition: Secondary | ICD-10-CM | POA: Diagnosis not present

## 2019-03-20 ENCOUNTER — Telehealth: Payer: Self-pay | Admitting: *Deleted

## 2019-03-20 DIAGNOSIS — D649 Anemia, unspecified: Secondary | ICD-10-CM | POA: Diagnosis not present

## 2019-03-20 DIAGNOSIS — Z741 Need for assistance with personal care: Secondary | ICD-10-CM | POA: Diagnosis not present

## 2019-03-20 DIAGNOSIS — R2689 Other abnormalities of gait and mobility: Secondary | ICD-10-CM | POA: Diagnosis not present

## 2019-03-20 DIAGNOSIS — M6281 Muscle weakness (generalized): Secondary | ICD-10-CM | POA: Diagnosis not present

## 2019-03-20 DIAGNOSIS — K5720 Diverticulitis of large intestine with perforation and abscess without bleeding: Secondary | ICD-10-CM | POA: Diagnosis not present

## 2019-03-20 DIAGNOSIS — C8378 Burkitt lymphoma, lymph nodes of multiple sites: Secondary | ICD-10-CM | POA: Diagnosis not present

## 2019-03-20 NOTE — Telephone Encounter (Signed)
Patient's wife called. Patient currently in rehab at [facility] [place]. He will be here for appt with [name] on 7/9. She would liked to be called during appt or after appt by [name] [phone]. Advised her that her contact info will be added to appt note and that [name] will be given message.

## 2019-03-21 ENCOUNTER — Telehealth: Payer: Self-pay

## 2019-03-21 DIAGNOSIS — C8378 Burkitt lymphoma, lymph nodes of multiple sites: Secondary | ICD-10-CM | POA: Diagnosis not present

## 2019-03-21 DIAGNOSIS — M6281 Muscle weakness (generalized): Secondary | ICD-10-CM | POA: Diagnosis not present

## 2019-03-21 DIAGNOSIS — K5720 Diverticulitis of large intestine with perforation and abscess without bleeding: Secondary | ICD-10-CM | POA: Diagnosis not present

## 2019-03-21 DIAGNOSIS — R2689 Other abnormalities of gait and mobility: Secondary | ICD-10-CM | POA: Diagnosis not present

## 2019-03-21 DIAGNOSIS — Z741 Need for assistance with personal care: Secondary | ICD-10-CM | POA: Diagnosis not present

## 2019-03-21 DIAGNOSIS — D649 Anemia, unspecified: Secondary | ICD-10-CM | POA: Diagnosis not present

## 2019-03-21 LAB — FUNGUS CULTURE RESULT

## 2019-03-21 LAB — FUNGUS CULTURE WITH STAIN

## 2019-03-21 LAB — FUNGAL ORGANISM REFLEX

## 2019-03-21 NOTE — Progress Notes (Signed)
HEMATOLOGY/ONCOLOGY CLINIC NOTE  Date of Service: 03/22/19    Patient Care Team: Mark Olp, MD as PCP - General (Family Medicine)  CHIEF COMPLAINTS/PURPOSE OF CONSULTATION:  F/u for continued Mx of Burkitts lymphoma  HISTORY OF PRESENTING ILLNESS:   Mark Frey is a wonderful 63 y.o. male who has been referred to [place] by my colleague Dr. 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 He is accompanied today by his wife. The pt reports that he is doing well overall.   The pt started R-EPOCH every 3 weeks with neulasta support on 03/17/18. He presented to the ED with right leg swelling, found to be a DVT and bilateral PE, which resulted in the incidental finding of a lower abdomen tumor measuring 18.7 x 18.5 x 17.8 cm. He denies any abdominal pains or changes in his bowel habits prior to this finding. He first noted blood in the stools 4-5 days prior to appearing to the ED.  The pt reports that he has been taking 155m Lovenox injections once each day, except for the days in which he had blood in the stools. He hasn't had blood in his stools for the last 7 days.   He has had GI bleed related to bowel involvement with his lymphoma - this is now resolving and his hgb is stabilizing.  He notes that his scrotum and leg swelling has decreased. He denies having CP or SOB at any point from his PE.   Most recent lab results (04/06/18) of CBC w/diff, CMP, Reticulocytes  is as follows: all values are WNL except for WBC at 15.3k, RBC at 3.65, HGB at 10.5, HCT at 31.4, RDW at 17.8, PLT at 640k, ANC at 12.7k, Monocytes abs at 1.6k, Glucose at 162, Total Protein at 6.2, Albumin at 3.1, Total bilirubin at <0.2, Retic ct pct at 2.5%. Uric acid 04/06/18 was low at 3.0 LDH 04/06/18 elevated at 251  On review of systems, pt reports remaining right leg swelling, blood in the stools 7 days ago, two  bowel movements each day, left leg swelling, improved scrotal swelling, eating well, and denies problems with his port, abdominal pains, constipation, diarrhea, jaw pain, pain along the spine, problems passing urine, and any other symptoms.  Interval History:   Mark VELISis called today via a [employer] brokerfor management and evaluation of his Burkitt's lymphoma. I last saw the pt during his previous admission, discharged to SNF on 03/06/19. The pt reports that he is doing well overall. He is accompanied today by his wife, [name] via [employer]  The pt reports that his therapy has ben going well at his SNF. He notes that he has been eating very well and is eating 3 meals a day. He notes that his weight was in the 150s upon 03/06/19 discharge, and is today at 163 pounds. The pt's wife notes that he has been off TPN since 3 days prior to his last discharge on 03/06/19. He notes that he has been exceeding his goals with PT. He is ambulating with a walker and has walked as far as 59 feet with a walker. He does continue on Marinol. The pt also continues on 566mEliquis BID.  The pt notes that he continues to have some lower, left discomfort but much improved since discharge. He continues to have small amounts of liquid stools through the rectum, and notes  that he ileostomy output is stable and less liquid.  Lab results today (03/22/19) of CBC w/diff and CMP is as follows: all values are WNL except for RBC at 3.27, HGB at 10.3, HCT at 34.0, MCV at 104.0, RDW at 18.6, Lymphs abs at 400, Potassium at 3.4, CO2 at 21, Glucose at 119, BUN at 7, Creatinine at 0.55, Albumin at 3.2, ALT at 68. 03/22/19 LDH at 94 03/22/19 Sedimentation ratesis down from 140 to 76 03/22/19 C-reactive protein is at 2.0 down from 14.7   On review of systems, pt reports eating well, increasing activity, small amount of liquid stool through rectum, discomfort in LLQ, mild weight increase, and denies any other symptoms.    MEDICAL  HISTORY:  Past Medical History:  Diagnosis Date   ALLERGIC RHINITIS    Cancer ([place])    Lymphoma    Diabetes mellitus    Hyperlipidemia     SURGICAL HISTORY: Past Surgical History:  Procedure Laterality Date   BIOPSY  03/15/2018   Procedure: BIOPSY;  Surgeon: Milus Banister, MD;  Location: [facility];  Service: Endoscopy;;   BIOPSY  03/01/2019   Procedure: BIOPSY;  Surgeon: Ladene Artist, MD;  Location: [facility];  Service: Endoscopy;;   BOWEL RESECTION N/A 08/25/2018   Procedure: LAPROSCOPIC LOOP ILEOSTOMY;  Surgeon: Greer Pickerel, MD;  Location: [name];  Service: General;  Laterality: N/A;   CLEFT PALATE REPAIR     COLONOSCOPY N/A 03/15/2018   Procedure: COLONOSCOPY;  Surgeon: Milus Banister, MD;  Location: [name];  Service: Endoscopy;  Laterality: N/A;   COLONOSCOPY N/A 08/20/2018   Procedure: COLONOSCOPY;  Surgeon: Irene Shipper, MD;  Location: [facility];  Service: Endoscopy;  Laterality: N/A;   deviated septum repair     slight improvement   ESOPHAGOGASTRODUODENOSCOPY N/A 03/15/2018   Procedure: ESOPHAGOGASTRODUODENOSCOPY (EGD);  Surgeon: Milus Banister, MD;  Location: [name];  Service: Endoscopy;  Laterality: N/A;   FLEXIBLE SIGMOIDOSCOPY N/A 03/01/2019   Procedure: FLEXIBLE SIGMOIDOSCOPY;  Surgeon: Ladene Artist, MD;  Location: [facility];  Service: Endoscopy;  Laterality: N/A;   ILEOSCOPY N/A 03/01/2019   Procedure: ILEOSCOPY THROUGH STOMA;  Surgeon: Ladene Artist, MD;  Location: [facility];  Service: Endoscopy;  Laterality: N/A;   IR IMAGING GUIDED PORT INSERTION  03/17/2018   LAPAROSCOPY N/A 08/25/2018   Procedure: LAPAROSCOPY DIAGNOSTIC;  Surgeon: Greer Pickerel, MD;  Location: [facility];  Service: General;  Laterality: N/A;   TONSILLECTOMY      SOCIAL HISTORY: Social History   Socioeconomic History   Marital status: Married    Spouse name: Mark Frey   Number of children: 0   Years of education: Not on file   Highest  education level: Not on file  Occupational History   Not on file  Social Needs   Financial resource strain: Not hard at all   Food insecurity    Worry: Never true    Inability: Never true   Transportation needs    Medical: No    Non-medical: No  Tobacco Use   Smoking status: Never Smoker   Smokeless tobacco: Never Used  Substance and Sexual Activity   Alcohol use: Yes    Comment: occasional   Drug use: No   Sexual activity: Yes  Lifestyle   Physical activity    Days per week: 0 days    Minutes per session: 0 min   Stress: Not at all  Relationships   Social connections    Talks on phone:  More than three times a week    Gets together: More than three times a week    Attends religious service: 1 to 4 times per year    Active member of club or organization: No    Attends meetings of clubs or organizations: Never    Relationship status: Married   Intimate partner violence    Fear of current or ex partner: No    Emotionally abused: No    Physically abused: No    Forced sexual activity: No  Other Topics Concern   Not on file  Social History Narrative   Married 1985. No kids. 4 small dogs.       Works in Financial trader, residential      Hobbies: work on cars, Haematologist, exercise as able    FAMILY HISTORY: Family History  Problem Relation Age of Onset   Lung cancer Mother        smoker   Brain cancer Mother        metastasis   AAA (abdominal aortic aneurysm) Father        smoker    ALLERGIES:  is allergic to ciprofloxacin.  MEDICATIONS:  Current Outpatient Medications  Medication Sig Dispense Refill   acetaminophen (TYLENOL) 325 MG tablet Take 2 tablets (650 mg total) by mouth every 6 (six) hours as needed for mild pain (or Fever >/= 101). 30 tablet 0   apixaban (ELIQUIS) 5 MG TABS tablet DO NOT TAKE UNTIL SEEN BY YOUR ONCOLOGIST (Patient taking differently: Take 2.5 mg by mouth 2 (two) times daily. DO NOT TAKE UNTIL SEEN BY YOUR  ONCOLOGIST) 60 tablet 3   blood glucose meter kit and supplies Dispense based on patient and insurance preference. Use daily as directed. (E11.9). 1 each 0   citalopram (CELEXA) 20 MG tablet Take 1 tablet (20 mg total) by mouth daily for 30 days. 30 tablet 0   dexamethasone (DECADRON) 1 MG tablet Take 1 tablet (1 mg total) by mouth daily with breakfast for 30 days. 30 tablet 0   dronabinol (MARINOL) 5 MG capsule Take 1 capsule (5 mg total) by mouth 2 (two) times daily before lunch and supper. 60 capsule 0   glucose blood (FREESTYLE TEST STRIPS) test strip Use to check blood sugar daily 100 each 4   HYDROcodone-acetaminophen (NORCO) 10-325 MG tablet Take 1 tablet by mouth every 6 (six) hours as needed for moderate pain or severe pain. 30 tablet 0   Multiple Vitamin (MULTIVITAMIN WITH MINERALS) TABS tablet Take 1 tablet by mouth daily for 30 days. 30 tablet 0   ondansetron (ZOFRAN) 4 MG tablet Take 1 tablet (4 mg total) by mouth every 6 (six) hours as needed for nausea. 20 tablet 0   zinc sulfate 220 (50 Zn) MG capsule Take 1 capsule (220 mg total) by mouth 2 (two) times daily for 30 days. 60 capsule 0   No current facility-administered medications for this visit.     REVIEW OF SYSTEMS:    A 10+ POINT REVIEW OF SYSTEMS WAS OBTAINED including neurology, dermatology, psychiatry, cardiac, respiratory, lymph, extremities, GI, GU, Musculoskeletal, constitutional, breasts, reproductive, HEENT.  All pertinent positives are noted in the HPI.  All others are negative.   PHYSICAL EXAMINATION: ECOG PERFORMANCE STATUS: 1 - Symptomatic but completely ambulatory  Vitals:   03/22/19 0931  BP: 117/90  Pulse: (!) 124  Resp: 18  Temp: 98.7 F (37.1 C)  SpO2: 100%   Filed Weights   03/22/19 0931  Weight: 163 lb  9.6 oz (74.2 kg)   .Body mass index is 24.16 kg/m.  GENERAL:alert, in no acute distress and comfortable SKIN: no acute rashes, no significant lesions EYES: conjunctiva are pink and  non-injected, sclera anicteric OROPHARYNX: MMM, no exudates, no oropharyngeal erythema or ulceration NECK: supple, no JVD LYMPH:  no palpable lymphadenopathy in the cervical, axillary or inguinal regions LUNGS: clear to auscultation b/l with normal respiratory effort HEART: regular rate & rhythm ABDOMEN:  normoactive bowel sounds , tender to palpation in LLQ, not distended. No palpable hepatosplenomegaly. Ileostomy in situ. Extremity: no pedal edema PSYCH: alert & oriented x 3 with fluent speech NEURO: no focal motor/sensory deficits    LABORATORY DATA:   CBC Latest Ref Rng & Units 03/22/2019 03/05/2019 03/03/2019  WBC 4.0 - 10.5 K/uL 5.9 3.1(L) 2.9(L)  Hemoglobin 13.0 - 17.0 g/dL 10.3(L) 8.1(L) 8.4(L)  Hematocrit 39.0 - 52.0 % 34.0(L) 26.4(L) 27.6(L)  Platelets 150 - 400 K/uL 196 146(L) 139(L)    CMP Latest Ref Rng & Units 03/22/2019 03/05/2019 03/03/2019  Glucose 70 - 99 mg/dL 119(H) 95 72  BUN 8 - 23 mg/dL 7(L) 9 9  Creatinine 0.61 - 1.24 mg/dL 0.55(L) 0.37(L) 0.47(L)  Sodium 135 - 145 mmol/L 139 137 135  Potassium 3.5 - 5.1 mmol/L 3.4(L) 3.3(L) 3.5  Chloride 98 - 111 mmol/L 105 103 100  CO2 22 - 32 mmol/L 21(L) 24 26  Calcium 8.9 - 10.3 mg/dL 9.4 8.8(L) 9.1  Total Protein 6.5 - 8.1 g/dL 6.6 - -  Total Bilirubin 0.3 - 1.2 mg/dL 0.3 - -  Alkaline Phos 38 - 126 U/L 110 - -  AST 15 - 41 U/L 23 - -  ALT 0 - 44 U/L 68(H) - -   Lab Results  Component Value Date   LDH 94 (L) 03/22/2019    03/01/19 Colonoscopy biopsies:     03/17/18 Cytogenetics:   03/17/18 Colon Bx:   03/13/18 Peritoneum Biopsy:      RADIOGRAPHIC STUDIES:  I have personally reviewed the radiological images as listed and agreed with the findings in the report. Ct Abdomen Pelvis W Contrast  Result Date: 02/27/2019 CLINICAL DATA:  Multiple fluid collections along the course of the descending colon consistent with developing abscesses. Fever. Abdominal pain. EXAM: CT ABDOMEN AND PELVIS WITH CONTRAST TECHNIQUE:  Multidetector CT imaging of the abdomen and pelvis was performed using the standard protocol following bolus administration of intravenous contrast. CONTRAST:  142m OMNIPAQUE IOHEXOL 300 MG/ML  SOLN COMPARISON:  02/19/2019 FINDINGS: Lower chest: No acute abnormality. Hepatobiliary: There are few hypoattenuating nodules in the liver which are stable from prior study and are likely of no clinical significance. There is mild gallbladder wall thickening. Pancreas: Unremarkable. No pancreatic ductal dilatation or surrounding inflammatory changes. Spleen: Normal in size without focal abnormality. Adrenals/Urinary Tract: There are nonobstructing stones in the lower pole the right kidney. There is no hydronephrosis. Left kidney is unremarkable. The adrenal glands are unremarkable. The urinary bladder is tethered to the sigmoid colon. Stomach/Bowel: Again identified are fluid collections along the descending colon and sigmoid colon. These fluid collections are stable to slightly decreased in size when compared to prior study. There is diffuse wall thickening of the nearby sigmoid colon and descending colon. The descending colon is tethered to the abdominal wall. In this location the fluid collection has decreased in size. A right lower quadrant ostomy is again noted. There is no evidence of a small-bowel obstruction. Vascular/Lymphatic: Aortic atherosclerosis. No enlarged abdominal or pelvic lymph nodes. Reproductive:  Prostate is unremarkable. Other: There are small fat containing bilateral inguinal hernias. There is a small fat containing periumbilical hernia. Musculoskeletal: No acute or significant osseous findings. IMPRESSION: 1. Stable to slightly decreased fluid collection surrounding the sigmoid colon and descending colon. 2. Persistent diffuse wall thickening of virtually all of the visualized colon consistent with colitis. A more nodular appearance of the sigmoid colon is of unknown clinical significance.  Underlying tumor is not entirely excluded and would need to be correlated with colonoscopy. 3. The urinary bladder is tender to the sigmoid colon. There are multiple small bowel loops in the left lower quadrant that are tethered to the sigmoid colon. 4. Interval decrease in size of the fluid collection along the left lateral sidewall. The bowel remains tethered in this location. 5. Unchanged nonobstructing stone in the lower pole of the right kidney. 6. Mild wall thickening of the gallbladder. If there is clinical suspicion for acute cholecystitis, follow-up with ultrasound is recommended. Electronically Signed   By: Constance Holster M.D.   On: 02/27/2019 19:51   Ct Aspiration  Result Date: 02/22/2019 INDICATION: Pancolitis and history of high-grade lymphoma. Slowly improving pericolonic fluid collections and request for aspiration of one of the collections for diagnostic purposes as an infectious source has not been able to be isolated by other means. EXAM: CT-GUIDED ASPIRATION OF LEFT LOWER QUADRANT PERITONEAL FLUID COLLECTION MEDICATIONS: The patient is currently admitted to the hospital and receiving intravenous antibiotics. The antibiotics were administered within an appropriate time frame prior to the initiation of the procedure. ANESTHESIA/SEDATION: Fentanyl 100 mcg IV; Versed 2.0 mg IV Moderate Sedation Time:  15 minutes. The patient was continuously monitored during the procedure by the interventional radiology nurse under my direct supervision. COMPLICATIONS: None immediate. PROCEDURE: Informed written consent was obtained from the patient after a thorough discussion of the procedural risks, benefits and alternatives. All questions were addressed. Maximal Sterile Barrier Technique was utilized including caps, mask, sterile gowns, sterile gloves, sterile drape, hand hygiene and skin antiseptic. A timeout was performed prior to the initiation of the procedure. CT was performed through the abdomen and  pelvis in a supine position. After choosing a site for aspiration, the left lower abdominal wall was prepped with chlorhexidine and local anesthesia was provided with 1% lidocaine. An 18 gauge trocar needle was advanced under CT guidance to the level of a pericolonic fluid collection anterior and lateral to the proximal sigmoid colon. Aspiration was performed. A fluid sample was obtained and sent for culture analysis including a aerobic/anaerobic culture and fungal culture. FINDINGS: The tiny collection lateral to the descending colon appears smaller and was felt to be too small to obtain any diagnostic fluid from. There is some persistent fluid anterior and lateral to the proximal sigmoid colon that was targeted. There was return of 4 mL of bloody and turbid fluid from this area. IMPRESSION: CT-guided aspiration of pericolonic fluid adjacent to the sigmoid colon yielding 4 mL of bloody and turbid fluid. The fluid sample was sent for culture analysis. Electronically Signed   By: Aletta Edouard M.D.   On: 02/22/2019 10:58    ASSESSMENT & PLAN:   63 y.o. male with  1. 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 No BM or CNS involvement.   04/18/18 PET/CT revealed Continued improvement in right colonic wall thickening and surrounding bulky masses consistent with treated lymphoma. No hypermetabolic activity to suggest residual tumor. There is some hypermetabolic activity associated  with the sigmoid colon which demonstrates mild wall thickening, surrounding inflammation and underlying diverticulosis, suggesting mild diverticulitis. Suspected treatment related changes throughout the bone marrow. There is mildly increased activity within the clivus without clear corresponding finding on the CT images. Attention on follow-up recommended. Nonobstructing right renal calculi. 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   06/09/18 PET/CT revealedSoft tissue lesion within the ventral pelvis is again identified demonstrating mild to moderate increased uptake within SUV max of 5.61. Deauville criteria 4. Concerning for residual metabolically active tumor. 2. Similar appearance of diffusely thickened appendix within the right lower quadrant of the abdomen. Findings may reflect treatment related changes. 3. Increased radiotracer uptake throughout the bone marrow which is favored to represent treatment related changes. 4. Lad coronary artery atherosclerotic calcifications.   patient completed planned chemorx on 10/21  10/05/18 CT A/P revealed Persistent wall thickening in the sigmoid colon although a component of this could be due to diverticulosis. There is also a suggestion of wall thickening and potential mucosal enhancement in the terminal ileum, as well as adjacent indistinct stranding in the mesenteric and omental adipose tissues roughly similar to the prior exam. Some of this may be treatment related. Inflammatory process such as Crohn's disease not excluded. 2. No current adenopathy. 3. Other imaging findings of potential clinical significance: Nonobstructive right nephrolithiasis. Aortic Atherosclerosis. Moderate prostatomegaly. Left foraminal impingement at L4-5.  S/p IMRT with 36 Gy over 20 fractions, between 11/09/18 and 12/06/18 with Dr. Isidore Moos  2.H/oRLE DVT and b/l PEon anticoagulation.   3. Small bowel obstruction/ileus with sigmoid thickening/stenosis .  CT scan - sigmoid  obstructive lesion ? Scar tissue from resolved lymphoma vs residual lymphoma vs post inflammatory scarring from diverticulitis/limited perforation. Also has SBO from adhesions. Colonoscopy 08/21/2018 - no intramucosal lesions. Extrinsic compression of sigmoid colon. 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 Greer Pickerel  4.  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  GI panel and C. difficile negative.  CMV IgM negative, EBV IgM negative. 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  5. Resolved Dehydration with hypovolemic hyponatremia.  Other electrolyte issues including hypokalemia and hypomagnesemia -stabilizing.  6.  Pancytopenia Anemia likely multifactorial but could be from GI bleeding plus significant inflammation from severe radiation colitis. Leukopenia/lymphopenia likely from radiation toxicity. Protein-losing enteropathy can also accentuate the lymphopenia and bone marrow suppression due to severe inflammatory state from his radiation colitis. Thrombocytopenia from consumption related to GI bleeding and from radiation toxicity. No overt evidence of DIC. Significantly elevated fibrinogen level as a marker of severe inflammation and acute phase reactant. B12 within normal limits Copper levels within normal limits at 132 Zinc deficiency noted- 44 Viral work-up not revealing Low likelihood of Burkitt's lymphoma progression at this time.  7. Possible Crohn's  Follow up GI  PLAN: -Discussed pt labwork today, 03/22/19; WBC normalized to 5.9k. HGB improved to 10.3. PLT normalized to 196k. No indication for blood transfusion at this time. Sodium holding at 139, mild hypokalemia and recommend increasing dietary intake. Albumin improved to 3.2. -03/22/19 Magnesium 1.1 - po magnesium  replacement 435m po BID and rpt labs at SNF in 10 days -03/22/19 LDH at 94, C-reactive protein at 2.0, Sed rate is pending -Inflammation from  bowels, abscesses and infection as previous concerns for anemia.  -Discussed the 03/01/19 Colonoscopy biopsies which revealed stricturing and chronic inflammation, and could not rule out possibility of irritable bowel disease such as Chron's. -Concern for baseline Chron's previously undiagnosed and could have been a risk factor for his subsequent radiation colitis -Continue follow up with Dr JArdis Hughsin GI on 05/08/19, could consider CT Enterography or CT Abdomen to evaluate abscesses -Will order abdominal imaging in next 1-2 months if GI does not -Focus on nutrition, increasing activity levels and following up with GI -Will order pain medication for LLQ pain. Discussed that if his pain increases, he should seek out medical attention. -Will hold off on Oxandralone at this time as pt has been eating well and successfully increasing his activity levels with PT at his SNF -The pt shows no clinical or lab progression of his Burkitt's lymphoma at this time.  -No indication for further treatment at this time.  -Continue 5180mEliquis BID if no concerns for bleeding   -Continue 80m75mexamethasone each morning, and 5mg20mrinol BID for appetite stimulation. Dexa will also improve inflammation from colitis. -Continue optimizing nutrition and functional status with walking and getting up and about -Continue follow up with Dr. DaniOwens LofflerGI -tracking sed rate and CRP as a function of resoliving bowel inflammation. -PO replacement for zinc deficiency. -Empiric Vitamin B complex. -Will see the pt back in 8 weeks   RTC with Dr KaleIrene Limboh labs in 8 weeks   I discussed the assessment and treatment plan with the patient. The patient was provided an opportunity to ask questions and all were answered. The patient agreed with the plan and demonstrated an understanding of the instructions.   The patient was advised to call back or seek an in-person evaluation if the symptoms worsen or if the condition fails to  improve as anticipated.   The total time spent in the appt was 30 minutes and more than 50% was on counseling and direct patient cares.    GautSullivan LoneMS AMorongo ValleyIVMS SCH [facility] Hospitalatology/Oncology Physician [facility] Centerffice):       [phone] cell):  [phone]):           [phone] SchuBaldwin [place] acting as a scribe for Dr. GautSullivan Lone.I have reviewed the above documentation for accuracy and completeness, and I agree with the above. .GauBrunetta Genera

## 2019-03-21 NOTE — Telephone Encounter (Signed)
I notified patient to schedule a follow up appointment from May. Spoke to patient's wife Mark Frey states that [name] has been admitted to [facility] for a few weeks while he undergoes rehabilitation. Appointment has been scheduled for in office visit on 05/08/19

## 2019-03-22 ENCOUNTER — Telehealth: Payer: Self-pay | Admitting: Hematology

## 2019-03-22 ENCOUNTER — Other Ambulatory Visit: Payer: Self-pay

## 2019-03-22 ENCOUNTER — Other Ambulatory Visit: Payer: Self-pay | Admitting: *Deleted

## 2019-03-22 ENCOUNTER — Inpatient Hospital Stay: Payer: BC Managed Care – PPO | Attending: Radiation Oncology | Admitting: Hematology

## 2019-03-22 ENCOUNTER — Inpatient Hospital Stay: Payer: BC Managed Care – PPO

## 2019-03-22 VITALS — BP 117/90 | HR 124 | Temp 98.7°F | Resp 18 | Ht 69.0 in | Wt 163.6 lb

## 2019-03-22 DIAGNOSIS — C8378 Burkitt lymphoma, lymph nodes of multiple sites: Secondary | ICD-10-CM

## 2019-03-22 DIAGNOSIS — K921 Melena: Secondary | ICD-10-CM | POA: Insufficient documentation

## 2019-03-22 DIAGNOSIS — Z86711 Personal history of pulmonary embolism: Secondary | ICD-10-CM | POA: Diagnosis not present

## 2019-03-22 DIAGNOSIS — D61818 Other pancytopenia: Secondary | ICD-10-CM | POA: Diagnosis not present

## 2019-03-22 DIAGNOSIS — R600 Localized edema: Secondary | ICD-10-CM | POA: Diagnosis not present

## 2019-03-22 DIAGNOSIS — R1032 Left lower quadrant pain: Secondary | ICD-10-CM | POA: Insufficient documentation

## 2019-03-22 DIAGNOSIS — I2510 Atherosclerotic heart disease of native coronary artery without angina pectoris: Secondary | ICD-10-CM | POA: Insufficient documentation

## 2019-03-22 DIAGNOSIS — Z86718 Personal history of other venous thrombosis and embolism: Secondary | ICD-10-CM | POA: Insufficient documentation

## 2019-03-22 DIAGNOSIS — E119 Type 2 diabetes mellitus without complications: Secondary | ICD-10-CM | POA: Diagnosis not present

## 2019-03-22 DIAGNOSIS — Z801 Family history of malignant neoplasm of trachea, bronchus and lung: Secondary | ICD-10-CM | POA: Diagnosis not present

## 2019-03-22 DIAGNOSIS — Z8719 Personal history of other diseases of the digestive system: Secondary | ICD-10-CM | POA: Diagnosis not present

## 2019-03-22 DIAGNOSIS — D649 Anemia, unspecified: Secondary | ICD-10-CM | POA: Diagnosis not present

## 2019-03-22 DIAGNOSIS — Z79899 Other long term (current) drug therapy: Secondary | ICD-10-CM | POA: Diagnosis not present

## 2019-03-22 DIAGNOSIS — Z741 Need for assistance with personal care: Secondary | ICD-10-CM | POA: Diagnosis not present

## 2019-03-22 DIAGNOSIS — Z7901 Long term (current) use of anticoagulants: Secondary | ICD-10-CM

## 2019-03-22 DIAGNOSIS — R2689 Other abnormalities of gait and mobility: Secondary | ICD-10-CM | POA: Diagnosis not present

## 2019-03-22 DIAGNOSIS — R1084 Generalized abdominal pain: Secondary | ICD-10-CM

## 2019-03-22 DIAGNOSIS — M6281 Muscle weakness (generalized): Secondary | ICD-10-CM | POA: Diagnosis not present

## 2019-03-22 DIAGNOSIS — E785 Hyperlipidemia, unspecified: Secondary | ICD-10-CM | POA: Insufficient documentation

## 2019-03-22 DIAGNOSIS — E60 Dietary zinc deficiency: Secondary | ICD-10-CM | POA: Diagnosis not present

## 2019-03-22 DIAGNOSIS — K5720 Diverticulitis of large intestine with perforation and abscess without bleeding: Secondary | ICD-10-CM | POA: Diagnosis not present

## 2019-03-22 LAB — CBC WITH DIFFERENTIAL (CANCER CENTER ONLY)
Abs Immature Granulocytes: 0.07 10*3/uL (ref 0.00–0.07)
Basophils Absolute: 0 10*3/uL (ref 0.0–0.1)
Basophils Relative: 1 %
Eosinophils Absolute: 0 10*3/uL (ref 0.0–0.5)
Eosinophils Relative: 1 %
HCT: 34 % — ABNORMAL LOW (ref 39.0–52.0)
Hemoglobin: 10.3 g/dL — ABNORMAL LOW (ref 13.0–17.0)
Immature Granulocytes: 1 %
Lymphocytes Relative: 6 %
Lymphs Abs: 0.4 10*3/uL — ABNORMAL LOW (ref 0.7–4.0)
MCH: 31.5 pg (ref 26.0–34.0)
MCHC: 30.3 g/dL (ref 30.0–36.0)
MCV: 104 fL — ABNORMAL HIGH (ref 80.0–100.0)
Monocytes Absolute: 0.6 10*3/uL (ref 0.1–1.0)
Monocytes Relative: 10 %
Neutro Abs: 4.8 10*3/uL (ref 1.7–7.7)
Neutrophils Relative %: 81 %
Platelet Count: 196 10*3/uL (ref 150–400)
RBC: 3.27 MIL/uL — ABNORMAL LOW (ref 4.22–5.81)
RDW: 18.6 % — ABNORMAL HIGH (ref 11.5–15.5)
WBC Count: 5.9 10*3/uL (ref 4.0–10.5)
nRBC: 0 % (ref 0.0–0.2)

## 2019-03-22 LAB — CMP (CANCER CENTER ONLY)
ALT: 68 U/L — ABNORMAL HIGH (ref 0–44)
AST: 23 U/L (ref 15–41)
Albumin: 3.2 g/dL — ABNORMAL LOW (ref 3.5–5.0)
Alkaline Phosphatase: 110 U/L (ref 38–126)
Anion gap: 13 (ref 5–15)
BUN: 7 mg/dL — ABNORMAL LOW (ref 8–23)
CO2: 21 mmol/L — ABNORMAL LOW (ref 22–32)
Calcium: 9.4 mg/dL (ref 8.9–10.3)
Chloride: 105 mmol/L (ref 98–111)
Creatinine: 0.55 mg/dL — ABNORMAL LOW (ref 0.61–1.24)
GFR, Est AFR Am: >60 mL/min (ref >60)
GFR, Estimated: >60 mL/min (ref >60)
Glucose, Bld: 119 mg/dL — ABNORMAL HIGH (ref 70–99)
Potassium: 3.4 mmol/L — ABNORMAL LOW (ref 3.5–5.1)
Sodium: 139 mmol/L (ref 135–145)
Total Bilirubin: 0.3 mg/dL (ref 0.3–1.2)
Total Protein: 6.6 g/dL (ref 6.5–8.1)

## 2019-03-22 LAB — MAGNESIUM: Magnesium: 1.1 mg/dL — CL (ref 1.7–2.4)

## 2019-03-22 LAB — LACTATE DEHYDROGENASE: LDH: 94 U/L — ABNORMAL LOW (ref 98–192)

## 2019-03-22 LAB — SAMPLE TO BLOOD BANK

## 2019-03-22 LAB — SEDIMENTATION RATE: Sed Rate: 76 mm/hr — ABNORMAL HIGH (ref 0–16)

## 2019-03-22 LAB — C-REACTIVE PROTEIN: CRP: 2 mg/dL — ABNORMAL HIGH (ref <1.0)

## 2019-03-22 MED ORDER — HYDROCODONE-ACETAMINOPHEN 10-325 MG PO TABS: 1.0000 | ORAL_TABLET | Freq: Four times a day (QID) | ORAL | 0 refills | Status: DC | PRN

## 2019-03-22 NOTE — Telephone Encounter (Signed)
Scheduled appt per 7/9 los. Printed calendar.

## 2019-03-23 DIAGNOSIS — E440 Moderate protein-calorie malnutrition: Secondary | ICD-10-CM | POA: Diagnosis not present

## 2019-03-23 DIAGNOSIS — M6281 Muscle weakness (generalized): Secondary | ICD-10-CM | POA: Diagnosis not present

## 2019-03-23 DIAGNOSIS — K5720 Diverticulitis of large intestine with perforation and abscess without bleeding: Secondary | ICD-10-CM | POA: Diagnosis not present

## 2019-03-23 DIAGNOSIS — C8378 Burkitt lymphoma, lymph nodes of multiple sites: Secondary | ICD-10-CM | POA: Diagnosis not present

## 2019-03-23 DIAGNOSIS — K651 Peritoneal abscess: Secondary | ICD-10-CM | POA: Diagnosis not present

## 2019-03-23 DIAGNOSIS — D649 Anemia, unspecified: Secondary | ICD-10-CM | POA: Diagnosis not present

## 2019-03-23 DIAGNOSIS — E119 Type 2 diabetes mellitus without complications: Secondary | ICD-10-CM | POA: Diagnosis not present

## 2019-03-23 DIAGNOSIS — Z741 Need for assistance with personal care: Secondary | ICD-10-CM | POA: Diagnosis not present

## 2019-03-23 DIAGNOSIS — R2689 Other abnormalities of gait and mobility: Secondary | ICD-10-CM | POA: Diagnosis not present

## 2019-03-25 DIAGNOSIS — Z741 Need for assistance with personal care: Secondary | ICD-10-CM | POA: Diagnosis not present

## 2019-03-25 DIAGNOSIS — R2689 Other abnormalities of gait and mobility: Secondary | ICD-10-CM | POA: Diagnosis not present

## 2019-03-25 DIAGNOSIS — C8378 Burkitt lymphoma, lymph nodes of multiple sites: Secondary | ICD-10-CM | POA: Diagnosis not present

## 2019-03-25 DIAGNOSIS — D649 Anemia, unspecified: Secondary | ICD-10-CM | POA: Diagnosis not present

## 2019-03-25 DIAGNOSIS — M6281 Muscle weakness (generalized): Secondary | ICD-10-CM | POA: Diagnosis not present

## 2019-03-25 DIAGNOSIS — K5720 Diverticulitis of large intestine with perforation and abscess without bleeding: Secondary | ICD-10-CM | POA: Diagnosis not present

## 2019-03-26 ENCOUNTER — Telehealth: Payer: Self-pay | Admitting: *Deleted

## 2019-03-26 ENCOUNTER — Telehealth: Payer: Self-pay | Admitting: Gastroenterology

## 2019-03-26 DIAGNOSIS — Z741 Need for assistance with personal care: Secondary | ICD-10-CM | POA: Diagnosis not present

## 2019-03-26 DIAGNOSIS — M6281 Muscle weakness (generalized): Secondary | ICD-10-CM | POA: Diagnosis not present

## 2019-03-26 DIAGNOSIS — C8378 Burkitt lymphoma, lymph nodes of multiple sites: Secondary | ICD-10-CM | POA: Diagnosis not present

## 2019-03-26 DIAGNOSIS — D649 Anemia, unspecified: Secondary | ICD-10-CM | POA: Diagnosis not present

## 2019-03-26 DIAGNOSIS — K5720 Diverticulitis of large intestine with perforation and abscess without bleeding: Secondary | ICD-10-CM | POA: Diagnosis not present

## 2019-03-26 DIAGNOSIS — R2689 Other abnormalities of gait and mobility: Secondary | ICD-10-CM | POA: Diagnosis not present

## 2019-03-26 NOTE — Telephone Encounter (Signed)
Dr [name] mentioned the pt having a CT scan done prior to the 8/25 appt with Dr Ardis Hughs.  Dr [name] has not finished his office note to review. Dr Ardis Hughs please advise

## 2019-03-26 NOTE — Telephone Encounter (Signed)
Spouse called - asked when [name] would order CT scan discussed in appt. Appt with Dr. Ardis Hughs is 8/25 and appt with Dr.Kale is 9/3. [name] ordered the CT scan to be scheduled before. [name] ordered CT. Notified wife that she would be contacted with time. She verbalized understanding

## 2019-03-26 NOTE — Telephone Encounter (Signed)
Patient wife called to advised that patient is in the rehab right now but is needing to have a ct scan schedule but wants to know if dr.jacobs can schedule and do the patient need to keep upcoming appt. Please call wife back and advise thanks.

## 2019-03-27 DIAGNOSIS — R2689 Other abnormalities of gait and mobility: Secondary | ICD-10-CM | POA: Diagnosis not present

## 2019-03-27 DIAGNOSIS — C8378 Burkitt lymphoma, lymph nodes of multiple sites: Secondary | ICD-10-CM | POA: Diagnosis not present

## 2019-03-27 DIAGNOSIS — Z741 Need for assistance with personal care: Secondary | ICD-10-CM | POA: Diagnosis not present

## 2019-03-27 DIAGNOSIS — M6281 Muscle weakness (generalized): Secondary | ICD-10-CM | POA: Diagnosis not present

## 2019-03-27 DIAGNOSIS — K5720 Diverticulitis of large intestine with perforation and abscess without bleeding: Secondary | ICD-10-CM | POA: Diagnosis not present

## 2019-03-27 DIAGNOSIS — D649 Anemia, unspecified: Secondary | ICD-10-CM | POA: Diagnosis not present

## 2019-03-27 MED ORDER — MAGNESIUM OXIDE 400 (241.3 MG) MG PO TABS: 400.0000 mg | ORAL_TABLET | Freq: Two times a day (BID) | ORAL | 0 refills | Status: DC

## 2019-03-27 NOTE — Telephone Encounter (Signed)
I think repeat imaging is probably a good idea.  He needs CT scan abdomen and pelvis with IV and oral contrast in mid August,.  He should continue to plan to see me in later August.  Thank you I am glad he is feeling better from what I can tell from his office notes recently

## 2019-03-27 NOTE — Telephone Encounter (Signed)
I spoke with the pt's wife and she states Dr [name] is already in the process of setting up CT scan.

## 2019-03-28 ENCOUNTER — Telehealth: Payer: Self-pay

## 2019-03-28 DIAGNOSIS — Z741 Need for assistance with personal care: Secondary | ICD-10-CM | POA: Diagnosis not present

## 2019-03-28 DIAGNOSIS — M6281 Muscle weakness (generalized): Secondary | ICD-10-CM | POA: Diagnosis not present

## 2019-03-28 DIAGNOSIS — C8378 Burkitt lymphoma, lymph nodes of multiple sites: Secondary | ICD-10-CM | POA: Diagnosis not present

## 2019-03-28 DIAGNOSIS — K5720 Diverticulitis of large intestine with perforation and abscess without bleeding: Secondary | ICD-10-CM | POA: Diagnosis not present

## 2019-03-28 DIAGNOSIS — D649 Anemia, unspecified: Secondary | ICD-10-CM | POA: Diagnosis not present

## 2019-03-28 DIAGNOSIS — R2689 Other abnormalities of gait and mobility: Secondary | ICD-10-CM | POA: Diagnosis not present

## 2019-03-28 NOTE — Telephone Encounter (Signed)
-----   Message from [name], MD sent at 03/27/2019 10:54 PM EDT ----- Plz let patient/wife know magnesium is low and prescription for magnesium replacement has been sent to his pharmacy

## 2019-03-28 NOTE — Telephone Encounter (Signed)
Called patient's wife [name] and let her know that a prescription was sent to patient's pharmacy for Magnesium due to low magnesium. [name] stated patient is currently in rehab at [facility]. [place] and spoke to [facility] who took the order for Magnesium 400 mg- take one tablet by mouth two times daily for 30 days.

## 2019-03-28 NOTE — Progress Notes (Signed)
Cultures positive for Candida lusitaniea, from peritoneal abscess.  Will start patient with fluconazole 400 mg daily, I sent a message to Dr. Ardis Hughs and I informed his family.

## 2019-03-29 ENCOUNTER — Telehealth: Payer: Self-pay | Admitting: *Deleted

## 2019-03-29 DIAGNOSIS — C8378 Burkitt lymphoma, lymph nodes of multiple sites: Secondary | ICD-10-CM | POA: Diagnosis not present

## 2019-03-29 DIAGNOSIS — R2689 Other abnormalities of gait and mobility: Secondary | ICD-10-CM | POA: Diagnosis not present

## 2019-03-29 DIAGNOSIS — Z741 Need for assistance with personal care: Secondary | ICD-10-CM | POA: Diagnosis not present

## 2019-03-29 DIAGNOSIS — M6281 Muscle weakness (generalized): Secondary | ICD-10-CM | POA: Diagnosis not present

## 2019-03-29 DIAGNOSIS — K5720 Diverticulitis of large intestine with perforation and abscess without bleeding: Secondary | ICD-10-CM | POA: Diagnosis not present

## 2019-03-29 DIAGNOSIS — D649 Anemia, unspecified: Secondary | ICD-10-CM | POA: Diagnosis not present

## 2019-03-29 NOTE — Telephone Encounter (Signed)
Contacted patient's spouse to inform that [name] has ordered a CT for patient. Radiology scheduling [phone] will likely contact her as patient is at [facility] [place]. She verbalized understanding and said if she was not contacted, she will call scheduling.

## 2019-03-30 ENCOUNTER — Telehealth: Payer: Self-pay | Admitting: Gastroenterology

## 2019-03-30 DIAGNOSIS — K5100 Ulcerative (chronic) pancolitis without complications: Secondary | ICD-10-CM | POA: Diagnosis not present

## 2019-03-30 DIAGNOSIS — C8378 Burkitt lymphoma, lymph nodes of multiple sites: Secondary | ICD-10-CM | POA: Diagnosis not present

## 2019-03-30 DIAGNOSIS — D649 Anemia, unspecified: Secondary | ICD-10-CM | POA: Diagnosis not present

## 2019-03-30 DIAGNOSIS — K5720 Diverticulitis of large intestine with perforation and abscess without bleeding: Secondary | ICD-10-CM | POA: Diagnosis not present

## 2019-03-30 DIAGNOSIS — M6281 Muscle weakness (generalized): Secondary | ICD-10-CM | POA: Diagnosis not present

## 2019-03-30 DIAGNOSIS — R2689 Other abnormalities of gait and mobility: Secondary | ICD-10-CM | POA: Diagnosis not present

## 2019-03-30 DIAGNOSIS — I82411 Acute embolism and thrombosis of right femoral vein: Secondary | ICD-10-CM | POA: Diagnosis not present

## 2019-03-30 DIAGNOSIS — E119 Type 2 diabetes mellitus without complications: Secondary | ICD-10-CM | POA: Diagnosis not present

## 2019-03-30 DIAGNOSIS — Z741 Need for assistance with personal care: Secondary | ICD-10-CM | POA: Diagnosis not present

## 2019-03-30 NOTE — Telephone Encounter (Signed)
Pt's wife called to inform that pt's biopsy showed fungus so pt has been put on antibiotics and was told to make Dr. Ardis Hughs aware. Pls call pt's wife.

## 2019-03-30 NOTE — Telephone Encounter (Signed)
Left message on machine to call back  

## 2019-03-31 ENCOUNTER — Telehealth: Payer: Self-pay | Admitting: Nurse Practitioner

## 2019-03-31 NOTE — Telephone Encounter (Signed)
I spoke to pt's wife, pt was discharged home yesterday and he was not given RX for anti fungal med. I informed wife, I do not see what med was prescribed at this time. I will investigate further, told wifspoke with pt's wife at 2:28pm. She stated the hospitalist, Dr. Cathlean Sauer? Sp, preescribed an antifungal medication for pt which he received x 1 day at the [facility] assisted living. However,  I might not have an answer until tomorrow. 02/22/2019 fungal organism reflex + Candida Lusitaniae.  I do not see more recent fungal test result. I will contact Dr. Riccardo Dubin Arrien for RX clarification, may not reach his office until Monday.I did send Dr. Cathlean Sauer a msg via [place]. Will check for response in am.

## 2019-04-02 ENCOUNTER — Telehealth: Payer: Self-pay

## 2019-04-02 NOTE — Telephone Encounter (Signed)
-----   Message from Noralyn Pick, NP sent at 04/02/2019 11:35 AM EDT ----- Verlee Monte, I sent Dr. Ardis Hughs a msg this am regarding wife's call over weekend. Phone note in chart as follows: I spoke to pt's wife, pt was discharged home yesterday and he was not given RX for anti fungal med.  I do not see what med was prescribed at this time. I will investigate further, wife stated the hospitalist, Dr. Cathlean Sauer, prescribed an antifungal medication for pt which he received x 1 day at the [facility] assisted living then was discharged home without RX.  02/22/2019 fungal organism reflex + Candida Lusitaniae.  I do not see more recent fungal test result. I will contact Dr. Riccardo Dubin Arrien for RX clarification. I did send Dr. Cathlean Sauer a msg via Epic without a response.  Daryel Kenneth, I sent Dr. Ardis Hughs a message early this am about this call. Can you call Dr. Nolon Lennert office and let them know pt's wife called about RX.  This really isn't our issue as our docs did not prescribe antifungal tx.   Thank you. [employer]

## 2019-04-02 NOTE — Telephone Encounter (Signed)
[phone] Dr Cathlean Sauer.  I have provided the number to Dr Arrien's office to the pt's wife.  She will call and have her questions answered hopefully. I asked her to call back and I will help in any way I can to help.  The pt has been advised of the information and verbalized understanding.

## 2019-04-02 NOTE — Telephone Encounter (Signed)
Colleen the pt's wife called this morning to confirm that you were working on finding out about the fungal diagnosis and if the pt has been treated.  I can't find anything in the system stating he was treated.  Please advise

## 2019-04-03 ENCOUNTER — Telehealth: Payer: Self-pay | Admitting: Nurse Practitioner

## 2019-04-03 ENCOUNTER — Telehealth: Payer: Self-pay

## 2019-04-03 NOTE — Telephone Encounter (Signed)
I sent msg to Dr. Cathlean Sauer on 04/01/2019, his response received 7/20 as follows: Arrien, Jimmy Picket, MD  Noralyn Pick, NP        Fransico Meadow,   I spoke with Mr. Brentlee wife and apparently fluconazole was not continued at discharge. [facility] and he will follow up with Dr. Yong Channel (PCP) and Dr. Ardis Hughs (GI) as outpatient.   Thanks you for your message.   Mauricio.

## 2019-04-03 NOTE — Telephone Encounter (Signed)
Mr. Winebarger wife called and asked to cancel his follow up appointment with Dr. Isidore Moos on 04/13/19. She reports that he has several doctors appointments scheduled including [name] in medical oncology and is confident in their management of his care. I notified Dr. Isidore Moos of Mrs. Burkey's request and she is agreeable. At this time Mrs. Monacelli does not want to reschedule the appointment. I did encourage her to call with any questions or concerns if she or Mr. Borgmeyer have any. I will notify Dr. Isidore Moos.

## 2019-04-06 DIAGNOSIS — R269 Unspecified abnormalities of gait and mobility: Secondary | ICD-10-CM | POA: Diagnosis not present

## 2019-04-06 DIAGNOSIS — C162 Malignant neoplasm of body of stomach: Secondary | ICD-10-CM | POA: Diagnosis not present

## 2019-04-11 DIAGNOSIS — R269 Unspecified abnormalities of gait and mobility: Secondary | ICD-10-CM | POA: Diagnosis not present

## 2019-04-11 DIAGNOSIS — C162 Malignant neoplasm of body of stomach: Secondary | ICD-10-CM | POA: Diagnosis not present

## 2019-04-13 ENCOUNTER — Ambulatory Visit: Payer: Self-pay | Admitting: Radiation Oncology

## 2019-04-13 DIAGNOSIS — C162 Malignant neoplasm of body of stomach: Secondary | ICD-10-CM | POA: Diagnosis not present

## 2019-04-13 DIAGNOSIS — R269 Unspecified abnormalities of gait and mobility: Secondary | ICD-10-CM | POA: Diagnosis not present

## 2019-04-16 DIAGNOSIS — C162 Malignant neoplasm of body of stomach: Secondary | ICD-10-CM | POA: Diagnosis not present

## 2019-04-16 DIAGNOSIS — R269 Unspecified abnormalities of gait and mobility: Secondary | ICD-10-CM | POA: Diagnosis not present

## 2019-04-17 ENCOUNTER — Encounter: Payer: Self-pay | Admitting: Family Medicine

## 2019-04-17 ENCOUNTER — Ambulatory Visit (INDEPENDENT_AMBULATORY_CARE_PROVIDER_SITE_OTHER): Payer: BC Managed Care – PPO | Admitting: Family Medicine

## 2019-04-17 VITALS — Ht 69.0 in | Wt 163.0 lb

## 2019-04-17 DIAGNOSIS — F325 Major depressive disorder, single episode, in full remission: Secondary | ICD-10-CM

## 2019-04-17 DIAGNOSIS — K5100 Ulcerative (chronic) pancolitis without complications: Secondary | ICD-10-CM

## 2019-04-17 DIAGNOSIS — K56699 Other intestinal obstruction unspecified as to partial versus complete obstruction: Secondary | ICD-10-CM | POA: Diagnosis not present

## 2019-04-17 DIAGNOSIS — E43 Unspecified severe protein-calorie malnutrition: Secondary | ICD-10-CM

## 2019-04-17 MED ORDER — CITALOPRAM HYDROBROMIDE 20 MG PO TABS: 20.0000 mg | ORAL_TABLET | Freq: Every day | ORAL | 3 refills | Status: DC

## 2019-04-17 NOTE — Patient Instructions (Addendum)
Health Maintenance Due  Topic Date Due  . PNEUMOCOCCAL POLYSACCHARIDE VACCINE AGE 63-64 HIGH RISK - declines 07/15/1958  . TETANUS/TDAP - declines 09/17/2008  . OPHTHALMOLOGY EXAM - declines 03/23/2016  . FOOT EXAM - will complete at next visit 01/31/2018  . URINE MICROALBUMIN -  01/31/2018  . INFLUENZA VACCINE - declines 04/14/2019    Depression screen [facility] 2/9 04/17/2019 05/22/2018 05/09/2017  Decreased Interest 2 0 0  Down, Depressed, Hopeless 2 0 0  PHQ - 2 Score 4 0 0  Altered sleeping 0 - -  Tired, decreased energy 1 - -  Change in appetite 0 - -  Feeling bad or failure about yourself  0 - -  Trouble concentrating 0 - -  Moving slowly or fidgety/restless 0 - -  Suicidal thoughts 0 - -  PHQ-9 Score 5 - -  Difficult doing work/chores Not difficult at all - -  Some recent data might be hidden

## 2019-04-17 NOTE — Progress Notes (Signed)
Phone [phone]   Subjective:  Virtual visit via Video note. Chief complaint: Chief Complaint  Patient presents with  . Follow-up  . Radiation Colitis  . Diabetes  . Burkitt Lymphoma   This visit type was conducted due to national recommendations for restrictions regarding the COVID-19 Pandemic (e.g. social distancing).  This format is felt to be most appropriate for this patient at this time balancing risks to patient and risks to population by having him in for in person visit.  No physical exam was performed (except for noted visual exam or audio findings with Telehealth visits).   Our team/I connected with [name] at  4:20 PM EDT by a video enabled telemedicine application (doxy.me or caregility through epic) and verified that I am speaking with the correct person using two identifiers.  Location patient: Home-[place] Location provider: [facility], office Persons participating in the virtual visit:  patient  Our team/I discussed the limitations of evaluation and management by telemedicine and the availability of in person appointments. In light of current covid-19 pandemic, patient also understands that we are trying to protect them by minimizing in office contact if at all possible.  The patient expressed consent for telemedicine visit and agreed to proceed. Patient understands insurance will be billed.   ROS-no fever chills reported.  Appetite is improving.  Strength is improving- able to walk short distances with walker  Past Medical History-  Patient Active Problem List   Diagnosis Date Noted  . 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 Bilateral pulmonary embolism ([place]) 03/10/2018    Priority: High  . Controlled diabetes mellitus type II without complication ([place]) 37/16/9678    Priority: High  . Tachycardia 01/31/2017    Priority: Medium  . Hypertension 06/05/2014    Priority: Medium  . Irritable bowel syndrome 03/30/2010    Priority: Medium   . Hyperlipidemia 05/12/2007    Priority: Medium  . Dysmetabolic syndrome X 93/81/0175    Priority: Low  . BACK PAIN WITH RADICULOPATHY 06/09/2007    Priority: Low  . ALLERGIC RHINITIS 05/12/2007    Priority: Low  . Pancolitis ([place]) 02/22/2019  . Abscess of sigmoid colon 02/22/2019  . 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 Thrombocytopenia ([place])   . Abnormal CT scan, gastrointestinal tract   . Depression   . Symptomatic anemia   . Atrophy of muscle of multiple sites   . Palliative care by specialist   . Goals of care, counseling/discussion   . Dehydration 02/07/2019  . Hypomagnesemia 01/11/2019  . Neutropenia ([place]) 01/11/2019  . Other neutropenia ([place])   . Pressure injury of skin 01/07/2019  . Zinc deficiency   . Malnutrition of moderate degree 01/04/2019  . Radiation gastroenteritis   . Radiation colitis   . Hyponatremia 01/01/2019  . Pancytopenia ([place]) 01/01/2019  . Hematochezia   . Loop ilesotomy for fecal diversion of sigmoid stricture 08/25/2018  . Abnormal CT of the abdomen   . Severe protein-calorie malnutrition ([place]) 08/19/2018  . 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 Diverticulitis of colon   . Low magnesium level 07/30/2018  . Hypokalemia 07/21/2018  . Bowel obstruction in setting of lymphoma and sigmoid stricture 07/21/2018  . Diarrhea 07/21/2018  . Diabetes mellitus ([place])   . Edema   . Deep vein thrombosis (DVT) of femoral vein of right lower extremity ([place])   . Counseling regarding advance care planning and goals of care 04/25/2018  .  Gastrointestinal hemorrhage   . Encounter for antineoplastic chemotherapy   . 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 Port-A-Cath in place 03/27/2018  . Acute deep vein thrombosis (DVT) of femoral vein of right lower extremity ([place])   . Anemia 03/10/2018  . Hypoglycemia 03/10/2018  . Occult blood in stools 03/10/2018  . Abdominal mass 03/10/2018     Medications- reviewed and updated Current Outpatient Medications  Medication Sig Dispense Refill  . acetaminophen (TYLENOL) 325 MG tablet Take 2 tablets (650 mg total) by mouth every 6 (six) hours as needed for mild pain (or Fever >/= 101). 30 tablet 0  . apixaban (ELIQUIS) 5 MG TABS tablet DO NOT TAKE UNTIL SEEN BY YOUR ONCOLOGIST (Patient taking differently: Take 5 mg by mouth 2 (two) times daily. DO NOT TAKE UNTIL SEEN BY YOUR ONCOLOGIST) 60 tablet 3  . B Complex Vitamins (VITAMIN B COMPLEX PO) Take by mouth.    . blood glucose meter kit and supplies Dispense based on patient and insurance preference. Use daily as directed. (E11.9). 1 each 0  . citalopram (CELEXA) 20 MG tablet Take 1 tablet (20 mg total) by mouth daily. 90 tablet 3  . dexamethasone (DECADRON) 1 MG tablet Take 1 mg by mouth every morning.    . dronabinol (MARINOL) 5 MG capsule Take 1 capsule (5 mg total) by mouth 2 (two) times daily before lunch and supper. 60 capsule 0  . glucose blood (FREESTYLE TEST STRIPS) test strip Use to check blood sugar daily 100 each 4  . HYDROcodone-acetaminophen (NORCO) 10-325 MG tablet Take 1 tablet by mouth every 6 (six) hours as needed for moderate pain or severe pain. 30 tablet 0  . magnesium oxide (MAG-OX) 400 (241.3 Mg) MG tablet Take 1 tablet (400 mg total) by mouth 2 (two) times daily. 60 tablet 0  . Zinc Sulfate 220 (50 Zn) MG TABS Take 1 tablet by mouth 2 (two) times daily.     No current facility-administered medications for this visit.      Objective:  Ht 5' 9"  (1.753 m)   Wt 163 lb (73.9 kg)   BMI 24.07 kg/m  self reported vitals Gen: NAD, resting comfortably Lungs: nonlabored, normal respiratory rate  Skin: appears dry, no obvious rash     Assessment and Plan   #Hospital/rehab follow-up  s: Patient had a prolonged hospitalization from May 27 to June 23 and then was discharged to rehabilitation facility.  Patient was hospitalized due to worsening weakness for several  weeks to the point where he could not stand and once hospitalized was found to be dehydrated, have an anion gap metabolic acidosis and metabolic encephalopathy.  He eventually had to be placed on TPN for nutrition.  CT scan of the abdomen and pelvis showed multiple fluid collections along the course of descending sigmoid colon consistent with developing abscess.  Patient was placed on IV antibiotics and surgery was consulted- patient declined laparotomy with possible colectomy and ileostomy but did consent to percutaneous drainage of abscess-this seemed to be helpful when completed on June 11.  Later had colonoscopy on June 18 with findings of severe colitis and biopsies were obtained-possibility of underlying inflammatory bowel disease was discussed.  Unable to get full colonoscopy at that time due to stricture of sigmoid colon  Ultimately cultures showed Morganella as well as Pseudomonas as culprits for infection but also fungal element with Candida Lustitaniae-which was not discovered until after discharge.  Mr. Deans still has mucus from  rectum- 12-15 hours spaced apart now-really slowing down.  He has follow-up for CT on the 19th of the month and then sees Dr. Ardis Hughs on the 25th.  Patient knows if he has new or worsening symptoms can let us know immediately.  A week after release to assisted living to rehab- hospitalist Dr. Ileene Hutchinson called and said there was an atypical fungus he was diagnosed with. Did antifungals for 2 weeks.  -fluconaole 436m daily for 14 days. Due to stress of illness-patient was diagnosed with depression- citalopram has been very helpful for him.  He also had severe issues with appetite and had to be placed on dexamethasone 1 mg daily as well as Marinol- he has done very well on this regimen  Patient strength is certainly improving-Could not walk on his own when getting home from assisted living. 3x a week PT- now standing with walker and doing some walking in circles. Next big  hurdle- able to walk some with rollator.   Has had follow-up for Burkitt's lymphoma within the last few weeks and reportedly doing well-other than effects from radiation previously likely contributing to his colitis  Patient is compliant with Eliquis after prior pulmonary embolism  A/P:   Pancolitis ([place] - Plan: Appears to be improving.  Thankful patient was recently treated for fungal element.  Needs close follow-up with GI and repeat CT scan to see if further intervention needed- may not have fully resolved given continued discharge from rectum but does at least seem to be improving.  Question if inflammatory bowel disease could be underlying progression to colitis-we will discuss with GI.  With sigmoid stricture-wonder if colonoscopy will need to be repeated  Depression, major, single episode, complete remission ([place] - Plan: citalopram (CELEXA) 20 MG tablet, situational depression with recent illness likely contributing-doing well on citalopram and encouraged him to continue this  Severe protein-calorie malnutrition ([place] - Plan: Move dexamethasone to 1 mg every other day instead of daily and consider once every third day follow-up-can go back to higher dose if appetite suppresses.  Continue Marinol.  Can also get GIs opinion at follow-up  Hypomagnesemia-Mag twice a day- will be repeated in September with oncology  Macrocytosis-now on b12 complex MV-we will follow repeat blood work  Diabetes-controlled based on last A1c without medication-continue to monitor at least every 6 months Lab Results  Component Value Date   HGBA1C 6.0 (H) 01/25/2019   Recommended follow up: Already scheduled for September follow-up-we will check in at that time Future Appointments  Date Time Provider DRincon 05/02/2019  9:00 AM [facility]-CT 2 [facility]CT [facility] 05/08/2019  9:50 AM JMilus Banister MD LBGI-GI [facility]  05/17/2019 11:00 AM [facility]-MO LAB ONLY [facility]-MEDONC None  05/17/2019 11:40 AM KBrunetta Genera MD [facility]  05/22/2019 10:40 AM HMarin Olp MD LBPC-HPC PEC    Lab/Order associations:   ICD-10-CM   1. Depression, unspecified depression type  F32.9 citalopram (CELEXA) 20 MG tablet    Meds ordered this encounter  Medications  . citalopram (CELEXA) 20 MG tablet    Sig: Take 1 tablet (20 mg total) by mouth daily.    Dispense:  90 tablet    Refill:  3    Return precautions advised.  SGarret Reddish MD

## 2019-04-18 DIAGNOSIS — C162 Malignant neoplasm of body of stomach: Secondary | ICD-10-CM | POA: Diagnosis not present

## 2019-04-18 DIAGNOSIS — R269 Unspecified abnormalities of gait and mobility: Secondary | ICD-10-CM | POA: Diagnosis not present

## 2019-04-20 DIAGNOSIS — C162 Malignant neoplasm of body of stomach: Secondary | ICD-10-CM | POA: Diagnosis not present

## 2019-04-20 DIAGNOSIS — Z932 Ileostomy status: Secondary | ICD-10-CM | POA: Diagnosis not present

## 2019-04-20 DIAGNOSIS — R269 Unspecified abnormalities of gait and mobility: Secondary | ICD-10-CM | POA: Diagnosis not present

## 2019-04-23 DIAGNOSIS — C162 Malignant neoplasm of body of stomach: Secondary | ICD-10-CM | POA: Diagnosis not present

## 2019-04-23 DIAGNOSIS — R269 Unspecified abnormalities of gait and mobility: Secondary | ICD-10-CM | POA: Diagnosis not present

## 2019-04-25 DIAGNOSIS — C162 Malignant neoplasm of body of stomach: Secondary | ICD-10-CM | POA: Diagnosis not present

## 2019-04-25 DIAGNOSIS — R269 Unspecified abnormalities of gait and mobility: Secondary | ICD-10-CM | POA: Diagnosis not present

## 2019-04-27 DIAGNOSIS — C162 Malignant neoplasm of body of stomach: Secondary | ICD-10-CM | POA: Diagnosis not present

## 2019-04-27 DIAGNOSIS — R269 Unspecified abnormalities of gait and mobility: Secondary | ICD-10-CM | POA: Diagnosis not present

## 2019-04-30 DIAGNOSIS — C162 Malignant neoplasm of body of stomach: Secondary | ICD-10-CM | POA: Diagnosis not present

## 2019-04-30 DIAGNOSIS — R269 Unspecified abnormalities of gait and mobility: Secondary | ICD-10-CM | POA: Diagnosis not present

## 2019-05-02 ENCOUNTER — Ambulatory Visit (HOSPITAL_COMMUNITY)
Admission: RE | Admit: 2019-05-02 | Discharge: 2019-05-02 | Disposition: A | Discharge status: Home / Self Care | Payer: BC Managed Care – PPO | Source: Ambulatory Visit | Attending: Hematology | Admitting: Hematology

## 2019-05-02 ENCOUNTER — Encounter (HOSPITAL_COMMUNITY): Payer: Self-pay

## 2019-05-02 ENCOUNTER — Other Ambulatory Visit: Payer: Self-pay

## 2019-05-02 DIAGNOSIS — R1084 Generalized abdominal pain: Secondary | ICD-10-CM | POA: Diagnosis not present

## 2019-05-02 DIAGNOSIS — K5100 Ulcerative (chronic) pancolitis without complications: Secondary | ICD-10-CM | POA: Diagnosis not present

## 2019-05-02 DIAGNOSIS — C162 Malignant neoplasm of body of stomach: Secondary | ICD-10-CM | POA: Diagnosis not present

## 2019-05-02 DIAGNOSIS — R269 Unspecified abnormalities of gait and mobility: Secondary | ICD-10-CM | POA: Diagnosis not present

## 2019-05-02 MED ORDER — HEPARIN SOD (PORK) LOCK FLUSH 100 UNIT/ML IV SOLN
INTRAVENOUS | Status: AC
  Administered 2019-05-02: 10:00:00 500 [IU] via INTRAVENOUS
  Filled 2019-05-02: qty 5

## 2019-05-02 MED ORDER — SODIUM CHLORIDE (PF) 0.9 % IJ SOLN
INTRAMUSCULAR | Status: AC
  Filled 2019-05-02: qty 50

## 2019-05-02 MED ORDER — IOHEXOL 300 MG/ML  SOLN
100.0000 mL | Freq: Once | INTRAMUSCULAR | Status: AC | PRN
  Administered 2019-05-02: 10:00:00 100 mL via INTRAVENOUS

## 2019-05-02 MED ORDER — HEPARIN SOD (PORK) LOCK FLUSH 100 UNIT/ML IV SOLN
500.0000 [IU] | Freq: Once | INTRAVENOUS | Status: AC
  Administered 2019-05-02: 10:00:00 500 [IU] via INTRAVENOUS

## 2019-05-03 ENCOUNTER — Telehealth: Payer: Self-pay | Admitting: Family Medicine

## 2019-05-03 NOTE — Telephone Encounter (Signed)
Medication Refill - Medication: dexamethasone (DECADRON) 1 MG tablet   Has the patient contacted their pharmacy? Yes.    (Agent: If no, request that the patient contact the pharmacy for the refill.) (Agent: If yes, when and what did the pharmacy advise?)  Preferred Pharmacy (with phone number or street name):  [id number] [place] [id number] Phone: [phone]: [phone] Not a 24 hour pharmacy; exact hours not known.     Agent: Please be advised that RX refills may take up to 3 business days. We ask that you follow-up with your pharmacy.

## 2019-05-03 NOTE — Telephone Encounter (Signed)
See note

## 2019-05-03 NOTE — Telephone Encounter (Signed)
This refill cannot be delegated  Requested Prescriptions  Pending Prescriptions Disp Refills  . dexamethasone (DECADRON) 1 MG tablet      Sig: Take 1 tablet (1 mg total) by mouth every morning.     Not Delegated - Endocrinology:  Oral Corticosteroids Failed - 05/03/2019 10:31 AM      Failed - This refill cannot be delegated      Failed - Last BP in normal range    BP Readings from Last 1 Encounters:  03/22/19 117/90         Passed - Valid encounter within last 6 months    Recent Outpatient Visits          2 weeks ago Pancolitis [facility])   [facility] [place], [name], MD   2 months ago Generalized weakness   [place], [name], MD   3 months ago Pancolitis [facility])   [place], [name], MD   8 months ago Partial small bowel obstruction [facility])   [facility] [place], Ebony Hail, MD   1 year ago Controlled type 2 diabetes mellitus without complication, without long-term current use of insulin ([place])   [facility] [place], [name], MD      Future Appointments            In 2 weeks Yong Channel, [name], MD [place], [facility]

## 2019-05-04 ENCOUNTER — Other Ambulatory Visit: Payer: Self-pay

## 2019-05-04 ENCOUNTER — Encounter: Payer: Self-pay | Admitting: Family Medicine

## 2019-05-04 ENCOUNTER — Ambulatory Visit: Payer: Self-pay | Admitting: *Deleted

## 2019-05-04 ENCOUNTER — Telehealth: Payer: Self-pay | Admitting: Gastroenterology

## 2019-05-04 ENCOUNTER — Inpatient Hospital Stay (HOSPITAL_COMMUNITY)
Admission: EM | Admit: 2019-05-04 | Discharge: 2019-05-10 | DRG: 871 | Disposition: A | Discharge status: Home Health | Payer: BC Managed Care – PPO | Attending: Internal Medicine | Admitting: Internal Medicine

## 2019-05-04 ENCOUNTER — Emergency Department (HOSPITAL_COMMUNITY): Payer: BC Managed Care – PPO

## 2019-05-04 ENCOUNTER — Ambulatory Visit (INDEPENDENT_AMBULATORY_CARE_PROVIDER_SITE_OTHER): Payer: BC Managed Care – PPO | Admitting: Family Medicine

## 2019-05-04 ENCOUNTER — Encounter (HOSPITAL_COMMUNITY): Payer: Self-pay

## 2019-05-04 VITALS — Temp 100.0°F | Ht 69.0 in | Wt 163.0 lb

## 2019-05-04 DIAGNOSIS — L8992 Pressure ulcer of unspecified site, stage 2: Secondary | ICD-10-CM | POA: Diagnosis present

## 2019-05-04 DIAGNOSIS — K5100 Ulcerative (chronic) pancolitis without complications: Secondary | ICD-10-CM | POA: Diagnosis not present

## 2019-05-04 DIAGNOSIS — K651 Peritoneal abscess: Secondary | ICD-10-CM | POA: Diagnosis not present

## 2019-05-04 DIAGNOSIS — A4151 Sepsis due to Escherichia coli [E. coli]: Principal | ICD-10-CM | POA: Diagnosis present

## 2019-05-04 DIAGNOSIS — F329 Major depressive disorder, single episode, unspecified: Secondary | ICD-10-CM | POA: Diagnosis present

## 2019-05-04 DIAGNOSIS — R109 Unspecified abdominal pain: Secondary | ICD-10-CM | POA: Diagnosis present

## 2019-05-04 DIAGNOSIS — C8370 Burkitt lymphoma, unspecified site: Secondary | ICD-10-CM | POA: Diagnosis not present

## 2019-05-04 DIAGNOSIS — K520 Gastroenteritis and colitis due to radiation: Secondary | ICD-10-CM | POA: Diagnosis not present

## 2019-05-04 DIAGNOSIS — C8510 Unspecified B-cell lymphoma, unspecified site: Secondary | ICD-10-CM | POA: Diagnosis present

## 2019-05-04 DIAGNOSIS — I10 Essential (primary) hypertension: Secondary | ICD-10-CM | POA: Diagnosis not present

## 2019-05-04 DIAGNOSIS — D61818 Other pancytopenia: Secondary | ICD-10-CM | POA: Diagnosis present

## 2019-05-04 DIAGNOSIS — Z9221 Personal history of antineoplastic chemotherapy: Secondary | ICD-10-CM | POA: Diagnosis not present

## 2019-05-04 DIAGNOSIS — L02211 Cutaneous abscess of abdominal wall: Secondary | ICD-10-CM | POA: Diagnosis not present

## 2019-05-04 DIAGNOSIS — L0291 Cutaneous abscess, unspecified: Secondary | ICD-10-CM | POA: Diagnosis not present

## 2019-05-04 DIAGNOSIS — R509 Fever, unspecified: Secondary | ICD-10-CM

## 2019-05-04 DIAGNOSIS — E785 Hyperlipidemia, unspecified: Secondary | ICD-10-CM | POA: Diagnosis present

## 2019-05-04 DIAGNOSIS — K630 Abscess of intestine: Secondary | ICD-10-CM | POA: Diagnosis not present

## 2019-05-04 DIAGNOSIS — D630 Anemia in neoplastic disease: Secondary | ICD-10-CM | POA: Diagnosis present

## 2019-05-04 DIAGNOSIS — D696 Thrombocytopenia, unspecified: Secondary | ICD-10-CM | POA: Diagnosis present

## 2019-05-04 DIAGNOSIS — Y842 Radiological procedure and radiotherapy as the cause of abnormal reaction of the patient, or of later complication, without mention of misadventure at the time of the procedure: Secondary | ICD-10-CM | POA: Diagnosis present

## 2019-05-04 DIAGNOSIS — Z79891 Long term (current) use of opiate analgesic: Secondary | ICD-10-CM

## 2019-05-04 DIAGNOSIS — Z79899 Other long term (current) drug therapy: Secondary | ICD-10-CM | POA: Diagnosis not present

## 2019-05-04 DIAGNOSIS — K56609 Unspecified intestinal obstruction, unspecified as to partial versus complete obstruction: Secondary | ICD-10-CM | POA: Diagnosis not present

## 2019-05-04 DIAGNOSIS — Z20828 Contact with and (suspected) exposure to other viral communicable diseases: Secondary | ICD-10-CM | POA: Diagnosis not present

## 2019-05-04 DIAGNOSIS — A09 Infectious gastroenteritis and colitis, unspecified: Secondary | ICD-10-CM | POA: Diagnosis present

## 2019-05-04 DIAGNOSIS — N281 Cyst of kidney, acquired: Secondary | ICD-10-CM | POA: Diagnosis not present

## 2019-05-04 DIAGNOSIS — I700 Atherosclerosis of aorta: Secondary | ICD-10-CM | POA: Diagnosis not present

## 2019-05-04 DIAGNOSIS — D709 Neutropenia, unspecified: Secondary | ICD-10-CM

## 2019-05-04 DIAGNOSIS — R05 Cough: Secondary | ICD-10-CM | POA: Diagnosis not present

## 2019-05-04 DIAGNOSIS — F325 Major depressive disorder, single episode, in full remission: Secondary | ICD-10-CM | POA: Diagnosis not present

## 2019-05-04 DIAGNOSIS — J309 Allergic rhinitis, unspecified: Secondary | ICD-10-CM | POA: Diagnosis not present

## 2019-05-04 DIAGNOSIS — E876 Hypokalemia: Secondary | ICD-10-CM | POA: Diagnosis not present

## 2019-05-04 DIAGNOSIS — Z86718 Personal history of other venous thrombosis and embolism: Secondary | ICD-10-CM

## 2019-05-04 DIAGNOSIS — Z888 Allergy status to other drugs, medicaments and biological substances status: Secondary | ICD-10-CM | POA: Diagnosis not present

## 2019-05-04 DIAGNOSIS — D703 Neutropenia due to infection: Secondary | ICD-10-CM | POA: Diagnosis not present

## 2019-05-04 DIAGNOSIS — K589 Irritable bowel syndrome without diarrhea: Secondary | ICD-10-CM | POA: Diagnosis not present

## 2019-05-04 DIAGNOSIS — Z933 Colostomy status: Secondary | ICD-10-CM

## 2019-05-04 DIAGNOSIS — Z7901 Long term (current) use of anticoagulants: Secondary | ICD-10-CM

## 2019-05-04 DIAGNOSIS — Z8572 Personal history of non-Hodgkin lymphomas: Secondary | ICD-10-CM | POA: Diagnosis present

## 2019-05-04 DIAGNOSIS — Z923 Personal history of irradiation: Secondary | ICD-10-CM | POA: Diagnosis not present

## 2019-05-04 DIAGNOSIS — E119 Type 2 diabetes mellitus without complications: Secondary | ICD-10-CM | POA: Diagnosis not present

## 2019-05-04 DIAGNOSIS — N200 Calculus of kidney: Secondary | ICD-10-CM | POA: Diagnosis not present

## 2019-05-04 DIAGNOSIS — Z86711 Personal history of pulmonary embolism: Secondary | ICD-10-CM

## 2019-05-04 DIAGNOSIS — J9811 Atelectasis: Secondary | ICD-10-CM | POA: Diagnosis not present

## 2019-05-04 DIAGNOSIS — L89152 Pressure ulcer of sacral region, stage 2: Secondary | ICD-10-CM | POA: Diagnosis not present

## 2019-05-04 DIAGNOSIS — K5010 Crohn's disease of large intestine without complications: Secondary | ICD-10-CM | POA: Diagnosis not present

## 2019-05-04 LAB — CBC
HCT: 25.8 % — ABNORMAL LOW (ref 39.0–52.0)
Hemoglobin: 8.1 g/dL — ABNORMAL LOW (ref 13.0–17.0)
MCH: 32.8 pg (ref 26.0–34.0)
MCHC: 31.4 g/dL (ref 30.0–36.0)
MCV: 104.5 fL — ABNORMAL HIGH (ref 80.0–100.0)
Platelets: 157 10*3/uL (ref 150–400)
RBC: 2.47 MIL/uL — ABNORMAL LOW (ref 4.22–5.81)
RDW: 16.3 % — ABNORMAL HIGH (ref 11.5–15.5)
WBC: 0.6 10*3/uL — CL (ref 4.0–10.5)
nRBC: 0 % (ref 0.0–0.2)

## 2019-05-04 LAB — COMPREHENSIVE METABOLIC PANEL
ALT: 58 U/L — ABNORMAL HIGH (ref 0–44)
AST: 25 U/L (ref 15–41)
Albumin: 2.8 g/dL — ABNORMAL LOW (ref 3.5–5.0)
Alkaline Phosphatase: 96 U/L (ref 38–126)
Anion gap: 11 (ref 5–15)
BUN: 10 mg/dL (ref 8–23)
CO2: 25 mmol/L (ref 22–32)
Calcium: 9.1 mg/dL (ref 8.9–10.3)
Chloride: 98 mmol/L (ref 98–111)
Creatinine, Ser: 0.52 mg/dL — ABNORMAL LOW (ref 0.61–1.24)
GFR calc Af Amer: >60 mL/min (ref >60)
GFR calc non Af Amer: >60 mL/min (ref >60)
Glucose, Bld: 236 mg/dL — ABNORMAL HIGH (ref 70–99)
Potassium: 3.4 mmol/L — ABNORMAL LOW (ref 3.5–5.1)
Sodium: 134 mmol/L — ABNORMAL LOW (ref 135–145)
Total Bilirubin: 0.5 mg/dL (ref 0.3–1.2)
Total Protein: 6.6 g/dL (ref 6.5–8.1)

## 2019-05-04 LAB — GLUCOSE, CAPILLARY: Glucose-Capillary: 135 mg/dL — ABNORMAL HIGH (ref 70–99)

## 2019-05-04 LAB — URINALYSIS, ROUTINE W REFLEX MICROSCOPIC
Bilirubin Urine: NEGATIVE
Glucose, UA: 50 mg/dL — AB
Hgb urine dipstick: NEGATIVE
Ketones, ur: NEGATIVE mg/dL
Leukocytes,Ua: NEGATIVE
Nitrite: NEGATIVE
Protein, ur: 30 mg/dL — AB
Specific Gravity, Urine: 1.027 (ref 1.005–1.030)
pH: 5 (ref 5.0–8.0)

## 2019-05-04 LAB — LACTIC ACID, PLASMA: Lactic Acid, Venous: 1.2 mmol/L (ref 0.5–1.9)

## 2019-05-04 LAB — LIPASE, BLOOD: Lipase: 21 U/L (ref 11–51)

## 2019-05-04 MED ORDER — SODIUM CHLORIDE 0.9% FLUSH
3.0000 mL | Freq: Once | INTRAVENOUS | Status: AC
  Administered 2019-05-04: 3 mL via INTRAVENOUS

## 2019-05-04 MED ORDER — HYDROCODONE-ACETAMINOPHEN 10-325 MG PO TABS
1.0000 | ORAL_TABLET | Freq: Four times a day (QID) | ORAL | Status: DC | PRN
  Administered 2019-05-05 – 2019-05-09 (×7): 1 via ORAL
  Filled 2019-05-04 (×7): qty 1

## 2019-05-04 MED ORDER — APIXABAN 5 MG PO TABS
5.0000 mg | ORAL_TABLET | Freq: Two times a day (BID) | ORAL | Status: DC
  Administered 2019-05-04: 5 mg via ORAL
  Filled 2019-05-04 (×2): qty 1

## 2019-05-04 MED ORDER — ZINC SULFATE 220 (50 ZN) MG PO CAPS
220.0000 mg | ORAL_CAPSULE | Freq: Two times a day (BID) | ORAL | Status: DC
  Administered 2019-05-04 – 2019-05-10 (×12): 220 mg via ORAL
  Filled 2019-05-04 (×13): qty 1

## 2019-05-04 MED ORDER — ACETAMINOPHEN 325 MG PO TABS: 650.0000 mg | ORAL_TABLET | Freq: Four times a day (QID) | ORAL | Status: DC | PRN

## 2019-05-04 MED ORDER — ONDANSETRON HCL 4 MG PO TABS: 4.0000 mg | ORAL_TABLET | Freq: Four times a day (QID) | ORAL | Status: DC | PRN

## 2019-05-04 MED ORDER — DEXAMETHASONE 0.5 MG PO TABS
1.0000 mg | ORAL_TABLET | Freq: Every morning | ORAL | Status: DC
  Administered 2019-05-05 – 2019-05-10 (×6): 1 mg via ORAL
  Filled 2019-05-04 (×6): qty 2

## 2019-05-04 MED ORDER — INSULIN ASPART 100 UNIT/ML ~~LOC~~ SOLN: 0.0000 [IU] | Freq: Every day | SUBCUTANEOUS | Status: DC

## 2019-05-04 MED ORDER — SODIUM CHLORIDE 0.9 % IV SOLN
INTRAVENOUS | Status: DC
  Administered 2019-05-05 – 2019-05-07 (×5): via INTRAVENOUS

## 2019-05-04 MED ORDER — METRONIDAZOLE IN NACL 5-0.79 MG/ML-% IV SOLN
500.0000 mg | Freq: Once | INTRAVENOUS | Status: AC
  Administered 2019-05-04: 500 mg via INTRAVENOUS
  Filled 2019-05-04: qty 100

## 2019-05-04 MED ORDER — DRONABINOL 5 MG PO CAPS
5.0000 mg | ORAL_CAPSULE | Freq: Every day | ORAL | Status: DC
  Administered 2019-05-05 – 2019-05-10 (×6): 5 mg via ORAL
  Filled 2019-05-04 (×6): qty 1

## 2019-05-04 MED ORDER — ZINC SULFATE 220 (50 ZN) MG PO TABS: 1.0000 | ORAL_TABLET | Freq: Two times a day (BID) | ORAL | Status: DC

## 2019-05-04 MED ORDER — CITALOPRAM HYDROBROMIDE 20 MG PO TABS
20.0000 mg | ORAL_TABLET | Freq: Every day | ORAL | Status: DC
  Administered 2019-05-05 – 2019-05-10 (×6): 20 mg via ORAL
  Filled 2019-05-04 (×6): qty 1

## 2019-05-04 MED ORDER — INSULIN ASPART 100 UNIT/ML ~~LOC~~ SOLN
0.0000 [IU] | Freq: Three times a day (TID) | SUBCUTANEOUS | Status: DC
  Administered 2019-05-05 – 2019-05-09 (×7): 1 [IU] via SUBCUTANEOUS
  Administered 2019-05-09 – 2019-05-10 (×2): 2 [IU] via SUBCUTANEOUS

## 2019-05-04 MED ORDER — VANCOMYCIN HCL IN DEXTROSE 1-5 GM/200ML-% IV SOLN
1000.0000 mg | Freq: Once | INTRAVENOUS | Status: AC
  Administered 2019-05-04: 1000 mg via INTRAVENOUS
  Filled 2019-05-04: qty 200

## 2019-05-04 MED ORDER — IOHEXOL 300 MG/ML  SOLN
100.0000 mL | Freq: Once | INTRAMUSCULAR | Status: AC | PRN
  Administered 2019-05-04: 100 mL via INTRAVENOUS

## 2019-05-04 MED ORDER — ONDANSETRON HCL 4 MG/2ML IJ SOLN: 4.0000 mg | Freq: Four times a day (QID) | INTRAMUSCULAR | Status: DC | PRN

## 2019-05-04 MED ORDER — SODIUM CHLORIDE 0.9 % IV SOLN
2.0000 g | Freq: Three times a day (TID) | INTRAVENOUS | Status: DC
  Administered 2019-05-05 – 2019-05-10 (×17): 2 g via INTRAVENOUS
  Filled 2019-05-04 (×18): qty 2

## 2019-05-04 MED ORDER — MAGNESIUM OXIDE 400 (241.3 MG) MG PO TABS
400.0000 mg | ORAL_TABLET | Freq: Two times a day (BID) | ORAL | Status: DC
  Administered 2019-05-04 – 2019-05-10 (×12): 400 mg via ORAL
  Filled 2019-05-04 (×12): qty 1

## 2019-05-04 MED ORDER — SODIUM CHLORIDE 0.9 % IV SOLN
1000.0000 mL | INTRAVENOUS | Status: DC
  Administered 2019-05-04: 1000 mL via INTRAVENOUS

## 2019-05-04 MED ORDER — SODIUM CHLORIDE 0.9 % IV SOLN
2.0000 g | Freq: Once | INTRAVENOUS | Status: AC
  Administered 2019-05-04: 2 g via INTRAVENOUS
  Filled 2019-05-04: qty 2

## 2019-05-04 MED ORDER — SODIUM CHLORIDE (PF) 0.9 % IJ SOLN
INTRAMUSCULAR | Status: AC
  Administered 2019-05-04: 18:00:00
  Filled 2019-05-04: qty 50

## 2019-05-04 MED ORDER — METRONIDAZOLE IN NACL 5-0.79 MG/ML-% IV SOLN
500.0000 mg | Freq: Three times a day (TID) | INTRAVENOUS | Status: DC
  Administered 2019-05-05 – 2019-05-10 (×17): 500 mg via INTRAVENOUS
  Filled 2019-05-04 (×17): qty 100

## 2019-05-04 NOTE — Telephone Encounter (Signed)
Pt reported that he has a low-grade fever.  Pt's PCP reported that CT scan shows possible abscess that could cause the fever.

## 2019-05-04 NOTE — Progress Notes (Signed)
A consult was received from an ED physician for vancomycin and cefepime per pharmacy dosing.  The patient's profile has been reviewed for ht/wt/allergies/indication/available labs.    A one time order has been placed for vancomycin 1000 mg IV and cefepime 44m IV x1.  Further antibiotics/pharmacy consults should be ordered by admitting physician if indicated.                       Thank you, PLynelle Doctor8/21/2020  5:27 PM

## 2019-05-04 NOTE — ED Triage Notes (Signed)
Patient states he was called by his physician to come to the ED for an abscess in his colon. Patient has been having a low grade fever at home patient denies any pain at this time.

## 2019-05-04 NOTE — Progress Notes (Addendum)
Pharmacy Antibiotic Note  TAUREN DELBUONO is a 63 y.o. male admitted on 05/04/2019 with FN.  Pharmacy has been consulted for cefepime dosing.  Plan: Cefepime 2g IV q8 for FN per current renal function CrCl > 60 ml/min Will sign off     Temp (24hrs), Avg:99.4 F (37.4 C), Min:98.5 F (36.9 C), Max:100 F (37.8 C)  Recent Labs  Lab 05/04/19 1617 05/04/19 1747  WBC 0.6*  --   CREATININE 0.52*  --   LATICACIDVEN  --  1.2    Estimated Creatinine Clearance: 95.7 mL/min (A) (by C-G formula based on SCr of 0.52 mg/dL (L)).    Allergies  Allergen Reactions  . Ciprofloxacin Other (See Comments)    Leg tingling    Thank you for allowing pharmacy to be a part of this patient's care.  Kara Mead 05/04/2019 9:07 PM

## 2019-05-04 NOTE — Telephone Encounter (Signed)
Pt wife called and stated that pt has a low grade fever ( 101.2 ) pt has recently had cancer treatment.      Patient had tylenol at 7:30- patient temp- 9:21 is 100.0. Patient's wife is concerned about the temperature returning. Call to the office for appointment. Reason for Disposition . [1] Fever > 100.0 F (37.8 C) AND [2] bedridden (e.g., nursing home patient, CVA, chronic illness, recovering from surgery)    Patient has limited mobility- he is mostly in the bed- he has been up on the walked with PT.  Answer Assessment - Initial Assessment Questions 1. TEMPERATURE: "What is the most recent temperature?"  "How was it measured?"      101.2, oral 2. ONSET: "When did the fever start?"      Tuesday- 101.8- treated with Tylenol- no temp for 2 days 3. SYMPTOMS: "Do you have any other symptoms besides the fever?"  (e.g., colds, headache, sore throat, earache, cough, rash, diarrhea, vomiting, abdominal pain)     Abdominal pain- Monday- patient is cancer patient- no pain since 4. CAUSE: If there are no symptoms, ask: "What do you think is causing the fever?"      unsure 5. CONTACTS: "Does anyone else in the family have an infection?"     no 6. TREATMENT: "What have you done so far to treat this fever?" (e.g., medications)     Tylenol- seems to helping- 100 7. IMMUNOCOMPROMISE: "Do you have of the following: diabetes, HIV positive, splenectomy, cancer chemotherapy, chronic steroid treatment, transplant patient, etc."     Colitis- colostomy - radiation therapy  8. PREGNANCY: "Is there any chance you are pregnant?" "When was your last menstrual period?"     n/a 9. TRAVEL: "Have you traveled out of the country in the last month?" (e.g., travel history, exposures)     no  Protocols used: FEVER-A-AH

## 2019-05-04 NOTE — Telephone Encounter (Signed)
The pt's wife was advised that Dr Ardis Hughs saw CT and he agrees that the pt needs to be seen in the ED.  Pt and wife agree

## 2019-05-04 NOTE — Telephone Encounter (Signed)
According to his primary care telemedicine visit note from today he was recommended to go to the emergency room because of his fevers and these findings on his CT.  Let him know that I agree with that recommendation.  If he does not want to go to the emergency room then please offer him an office visit with me on Monday.  A 1 PM OV should allow me time to get to my afternoon [place] cases on time.  Thanks

## 2019-05-04 NOTE — Telephone Encounter (Signed)
Patient scheduled for Doxy with Dr. Rogers Blocker.

## 2019-05-04 NOTE — Telephone Encounter (Signed)
Dr Ardis Hughs can you please review the recent CT scan ordered by pt PCP.  He was told he may have an abscess resulting in low grade fever?

## 2019-05-04 NOTE — ED Notes (Signed)
ED TO INPATIENT HANDOFF REPORT  Name/Age/Gender Mark Frey 63 y.o. male  Code Status Code Status History    Date Active Date Inactive Code Status Order ID Comments User Context   02/07/2019 1749 03/06/2019 2249 DNR [id number]  Mariel Aloe, MD Inpatient   02/07/2019 1336 02/07/2019 1354 DNR [id number]  Mariel Aloe, MD Inpatient   01/01/2019 1254 01/16/2019 2130 Full Code [id number]  Debbe Odea, MD ED   08/18/2018 0146 08/28/2018 1729 Full Code [id number]  [name], MD ED   07/30/2018 2133 08/04/2018 1735 Full Code [id number]  [name], MD Inpatient   07/21/2018 1458 07/24/2018 1551 Full Code [id number]  [name], MD ED   07/03/2018 1053 07/07/2018 1645 Full Code [id number]  [name], MD Inpatient   06/12/2018 1113 06/16/2018 1542 Full Code [id number]  [name], MD Inpatient   05/22/2018 1036 05/26/2018 1644 Full Code [id number]  [name], MD Inpatient   05/01/2018 0959 05/05/2018 2317 Full Code [id number]  [name], MD Inpatient   04/10/2018 1604 04/14/2018 1640 Full Code [id number]  [name], MD Inpatient   03/10/2018 2125 03/25/2018 2126 Full Code [id number]  [name], MD Inpatient   Advance Care Planning Activity    Questions for Most Recent Historical Code Status (Order [id number])    Question Answer Comment   In the event of cardiac or respiratory ARREST Do not call a "code blue"    In the event of cardiac or respiratory ARREST Do not perform Intubation, CPR, defibrillation or ACLS    In the event of cardiac or respiratory ARREST Use medication by any route, position, wound care, and other measures to relive pain and suffering. May use oxygen, suction and manual treatment of airway obstruction as needed for comfort.       Home/SNF/Other Home  Chief Complaint abscess; fever  Level of Care/Admitting Diagnosis ED Disposition    ED Disposition Condition Comment   Admit  Hospital Area: [facility] [[id number]]  Level of Care: Med-Surg [16]  Covid Evaluation: Asymptomatic Screening Protocol (No Symptoms)  Diagnosis: Intra-abdominal abscess ([place]) [882800]  Admitting Physician: Elwyn Reach [[id number]]  Attending Physician: Elwyn Reach [[id number]]  Estimated length of stay: past midnight tomorrow  Certification:: I certify this patient will need inpatient services for at least 2 midnights  PT Class (Do Not Modify): Inpatient [101]  PT Acc Code (Do Not Modify): Private [1]       Medical History Past Medical History:  Diagnosis Date  . ALLERGIC RHINITIS   . Cancer ([place])    Lymphoma   . Diabetes mellitus   . Hyperlipidemia     Allergies Allergies  Allergen Reactions  . Ciprofloxacin Other (See Comments)    Leg tingling    IV Location/Drains/Wounds Patient Lines/Drains/Airways Status   Active Line/Drains/Airways    Name:   Placement date:   Placement time:   Site:   Days:   Implanted Port 03/27/18 Right Chest   03/27/18    1141    Chest   403   Peripheral IV 05/04/19 Left Wrist   05/04/19    1805    Wrist   less than 1   Ileostomy Loop RUQ   08/25/18    1138    RUQ   252   Incision (Closed) 08/25/18 Abdomen Other (Comment)   08/25/18    1120     252   Incision - 3 Ports Abdomen Umbilicus Left;Upper Left  08/25/18    1115     252   Pressure Injury 01/04/19 Stage II -  Partial thickness loss of dermis presenting as a shallow open ulcer with a red, pink wound bed without slough. redness around wound bed   01/04/19    0930     120   Pressure Injury 02/10/19 Sacrum Mid Stage II -  Partial thickness loss of dermis presenting as a shallow open ulcer with a red, pink wound bed without slough. partial thickness of dermis, first layer of skin off presenting red/pink in color   02/10/19    1043     83          Labs/Imaging Results for orders placed or performed during the hospital encounter of 05/04/19 (from the past 48 hour(s))  Lipase, blood     Status:  None   Collection Time: 05/04/19  4:17 PM  Result Value Ref Range   Lipase 21 11 - 51 U/L    Comment: Performed at [facility], [place] [address] [place], [place] [address]  Comprehensive metabolic panel     Status: Abnormal   Collection Time: 05/04/19  4:17 PM  Result Value Ref Range   Sodium 134 (L) 135 - 145 mmol/L   Potassium 3.4 (L) 3.5 - 5.1 mmol/L   Chloride 98 98 - 111 mmol/L   CO2 25 22 - 32 mmol/L   Glucose, Bld 236 (H) 70 - 99 mg/dL   BUN 10 8 - 23 mg/dL   Creatinine, Ser 0.52 (L) 0.61 - 1.24 mg/dL   Calcium 9.1 8.9 - 10.3 mg/dL   Total Protein 6.6 6.5 - 8.1 g/dL   Albumin 2.8 (L) 3.5 - 5.0 g/dL   AST 25 15 - 41 U/L   ALT 58 (H) 0 - 44 U/L   Alkaline Phosphatase 96 38 - 126 U/L   Total Bilirubin 0.5 0.3 - 1.2 mg/dL   GFR calc non Af Amer >60 >60 mL/min   GFR calc Af Amer >60 >60 mL/min   Anion gap 11 5 - 15    Comment: Performed at  [facility], [place] [address] [place], [place] [address]  CBC     Status: Abnormal   Collection Time: 05/04/19  4:17 PM  Result Value Ref Range   WBC 0.6 (LL) 4.0 - 10.5 K/uL    Comment: REPEATED TO VERIFY THIS CRITICAL RESULT HAS VERIFIED AND BEEN CALLED TO ZACH T. RN BY MARVIN SCOTTON ON 08 21 2020 AT 1645, AND HAS BEEN READ BACK.     RBC 2.47 (L) 4.22 - 5.81 MIL/uL   Hemoglobin 8.1 (L) 13.0 - 17.0 g/dL   HCT 25.8 (L) 39.0 - 52.0 %   MCV 104.5 (H) 80.0 - 100.0 fL   MCH 32.8 26.0 - 34.0 pg   MCHC 31.4 30.0 - 36.0 g/dL   RDW 16.3 (H) 11.5 - 15.5 %   Platelets 157 150 - 400 K/uL   nRBC 0.0 0.0 - 0.2 %    Comment: Performed at [facility], [place] [address] [place], [place] [address]  Urinalysis, Routine w reflex microscopic     Status: Abnormal   Collection Time: 05/04/19  5:30 PM  Result Value Ref Range   Color, Urine AMBER (A) YELLOW    Comment: BIOCHEMICALS MAY BE AFFECTED BY COLOR   APPearance HAZY (A) CLEAR   Specific Gravity, Urine 1.027 1.005 - 1.030   pH 5.0 5.0 - 8.0  Glucose, UA 50 (A) NEGATIVE mg/dL   Hgb urine dipstick NEGATIVE NEGATIVE   Bilirubin Urine NEGATIVE NEGATIVE   Ketones, ur NEGATIVE NEGATIVE mg/dL   Protein, ur 30 (A) NEGATIVE mg/dL   Nitrite NEGATIVE NEGATIVE   Leukocytes,Ua NEGATIVE NEGATIVE   RBC / HPF 0-5 0 - 5 RBC/hpf   WBC, UA 0-5 0 - 5 WBC/hpf   Bacteria, UA RARE (A) NONE SEEN   Squamous Epithelial / LPF 0-5 0 - 5   Mucus PRESENT     Comment: Performed at [facility], [place] [address] [place], [place] [address]  Lactic acid, plasma     Status: None   Collection Time: 05/04/19  5:47 PM  Result Value Ref Range   Lactic Acid, Venous 1.2 0.5 - 1.9 mmol/L    Comment: Performed at [facility], [place] [address], [place], [place] [address]   Ct Abdomen Pelvis W Contrast  Addendum Date: 05/04/2019   ADDENDUM REPORT: 05/04/2019 19:43 ADDENDUM: The appearance of the LEFT LATERAL abdominal wall abscess is stable compared with most recent comparison, 05/02/2019. The salient findings were discussed with Lacretia Leigh on 05/04/2019 at 7:43 pm. Electronically Signed   By: Nolon Nations M.D.   On: 05/04/2019 19:43   Result Date: 05/04/2019 CLINICAL DATA:  Patient states he was called by his physician to come to the ED for an abscess in his colon. Patient has been having a low grade fever at home patient denies any pain at this time. Follow CT scan from 8/19. Per patient he has back pain. Patient's history of lymphoma and bowel obstruction. EXAM: CT ABDOMEN AND PELVIS WITH CONTRAST TECHNIQUE: Multidetector CT imaging of the abdomen and pelvis was performed using the standard protocol following bolus administration of intravenous contrast. CONTRAST:  158m OMNIPAQUE IOHEXOL 300 MG/ML  SOLN COMPARISON:  02/27/2019, 05/02/2019, and multiple prior studies FINDINGS: Lower chest: Subsegmental atelectasis at the lung bases. Hepatobiliary: Small cysts are identified. Gallbladder is present. Note is made of a Phrygian  cap. No evidence for acute cholecystitis. Pancreas: Unremarkable. No pancreatic ductal dilatation or surrounding inflammatory changes. Spleen: Normal in size without focal abnormality. Adrenals/Urinary Tract: Normal adrenal glands. Small bilateral renal cysts. Intrarenal calculus in the RIGHT kidney measures 7 millimeters. Punctate calcification is identified in the UPPER pole of the LEFT kidney, 2 millimeters in diameter. No solid renal mass or hydronephrosis. The ureters are unremarkable. Stomach/Bowel: Stomach is unremarkable. RIGHT LOWER QUADRANT loop ileostomy is nonobstructing. There continues to be significant tethering and inflammatory change in the central pelvis and LEFT LOWER QUADRANT, extending to the LEFT pelvic sidewall. The cecum and sigmoid colon appear to be adherent. There are coarse calcifications in the region of the soft tissue between the cecum and sigmoid colon. There is persistent tethering of the descending colon with the LEFT LATERAL abdominal wall. There is persistent focal low-attenuation within the LEFT LATERAL peritoneal reflection, consistent with abscess, now measuring 2.7 x 1.7 centimeters. This is best seen on image 35 of series 7. There is associated subcutaneous edema overlying the LEFT LATERAL abdominal wall. Vascular/Lymphatic: There is atherosclerotic calcification of the abdominal aorta. No aneurysm. No retroperitoneal or mesenteric adenopathy. Reproductive: Prostate is unremarkable. Other: None Musculoskeletal: Degenerative changes are seen in the LOWER thoracic and lumbar spine. No suspicious lytic or blastic lesions are identified. IMPRESSION: 1. Persistent tethering of the descending colon with the LEFT LATERAL abdominal wall, associated with abscess within the LEFT LATERAL peritoneal reflection measuring 2.7 x 1.7 centimeters. 2. No  evidence for bowel obstruction. 3. Bilateral nonobstructing intrarenal calculi. 4. Hepatic and renal cysts. 5.  Aortic atherosclerosis.   (ICD10-I70.0) 6. Degenerative changes in the LOWER thoracic and lumbar spine. Electronically Signed: By: Nolon Nations M.D. On: 05/04/2019 19:31   Dg Chest Port 1 View  Result Date: 05/04/2019 CLINICAL DATA:  Cough, low-grade fever, diabetes mellitus, history lymphoma EXAM: PORTABLE CHEST 1 VIEW COMPARISON:  Portable exam 1820 hours compared to 01/01/2019 FINDINGS: RIGHT jugular Port-A-Cath with tip projecting over SVC. Mediastinal contours and pulmonary vascularity normal. Bibasilar scarring unchanged. No acute infiltrate, pleural effusion or pneumothorax. No acute osseous findings. IMPRESSION: Bibasilar scarring. No acute abnormalities. Electronically Signed   By: Lavonia Dana M.D.   On: 05/04/2019 18:48    Pending Labs Unresulted Labs (From admission, onward)    Start     Ordered   05/04/19 1715  Lactic acid, plasma  Now then every 2 hours,   STAT     05/04/19 1714   05/04/19 1715  Blood Culture (routine x 2)  BLOOD CULTURE X 2,   STAT     05/04/19 1714   05/04/19 1715  SARS CORONAVIRUS 2 Nasal Swab Aptima Multi Swab  (Asymptomatic/Tier 2 Patients Labs)  Once,   STAT    Question Answer Comment  Is this test for diagnosis or screening Screening   Symptomatic for COVID-19 as defined by CDC No   Hospitalized for COVID-19 No   Admitted to ICU for COVID-19 No   Previously tested for COVID-19 No   Resident in a congregate (group) care setting No   Employed in healthcare setting No      05/04/19 1715   Signed and Held  HIV antibody (Routine Testing)  Once,   R     Signed and Held   Signed and Held  Comprehensive metabolic panel  Tomorrow morning,   R     Signed and Held   Signed and Held  CBC  Tomorrow morning,   R     Signed and Held          Vitals/Pain Today's Vitals   05/04/19 1727 05/04/19 1815 05/04/19 1856 05/04/19 2043  BP: 120/85 114/78 107/69 102/62  Pulse: 88 82 87 79  Resp: 18 18 19 13   Temp:      TempSrc:      SpO2: 100% 99% 100% 99%  PainSc:         Isolation Precautions No active isolations  Medications Medications  0.9 %  sodium chloride infusion (1,000 mLs Intravenous New Bag/Given 05/04/19 1806)  sodium chloride flush (NS) 0.9 % injection 3 mL (3 mLs Intravenous Given 05/04/19 1746)  ceFEPIme (MAXIPIME) 2 g in sodium chloride 0.9 % 100 mL IVPB (0 g Intravenous Stopped 05/04/19 1853)  metroNIDAZOLE (FLAGYL) IVPB 500 mg ( Intravenous Stopped 05/04/19 1926)  vancomycin (VANCOCIN) IVPB 1000 mg/200 mL premix ( Intravenous Stopped 05/04/19 1919)  sodium chloride (PF) 0.9 % injection (  Given by Other 05/04/19 1824)  iohexol (OMNIPAQUE) 300 MG/ML solution 100 mL (100 mLs Intravenous Contrast Given 05/04/19 1837)    Mobility walks with device

## 2019-05-04 NOTE — ED Notes (Signed)
Date and time results received: 05/04/19 1645 (use smartphrase ".now" to insert current time)  Test: WBC Critical Value: 0.6  Name of Provider Notified: Dr. Zenia Resides  Orders Received? Or Actions Taken?: Orders Received - See Orders for details

## 2019-05-04 NOTE — ED Provider Notes (Signed)
[facility] DEPT Provider Note   CSN: [id number] Arrival date & time: 05/04/19  1510     History   Chief Complaint Chief Complaint  Patient presents with  . Abscess  . Fever    HPI Mark Frey is a 63 y.o. male.     63 year old male with history of lymphoma presents with 2 days of abdominal pain.  Patient does have ostomy bag and has been having some crampy abdominal pain.  Temperature at home is been up to 102.  He has had no vomiting or diarrhea.  Denies any urinary symptoms.  No cough or congestion.  Notes normal stool in his back.  Is not receiving any chemotherapy currently.  Called his doctor and told to come here for further management.     Past Medical History:  Diagnosis Date  . ALLERGIC RHINITIS   . Cancer ([place])    Lymphoma   . Diabetes mellitus   . Hyperlipidemia     Patient Active Problem List   Diagnosis Date Noted  . Depression, major, single episode, complete remission ([place]) 04/17/2019  . Pancolitis ([place]) 02/22/2019  . 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 Thrombocytopenia ([place])   . Symptomatic anemia   . Atrophy of muscle of multiple sites   . Goals of care, counseling/discussion   . Hypomagnesemia 01/11/2019  . Neutropenia ([place]) 01/11/2019  . Other neutropenia ([place])   . Pressure injury of skin 01/07/2019  . Zinc deficiency   . Radiation gastroenteritis   . Radiation colitis   . Hyponatremia 01/01/2019  . Pancytopenia ([place]) 01/01/2019  . Hematochezia   . Loop ilesotomy for fecal diversion of sigmoid stricture 08/25/2018  . Severe protein-calorie malnutrition ([place]) 08/19/2018  . 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 Low magnesium level 07/30/2018  . Bowel obstruction in setting of lymphoma and sigmoid stricture 07/21/2018  . Edema   . Deep vein thrombosis (DVT) of femoral vein of right lower extremity ([place])   . Counseling regarding advance care planning and goals  of care 04/25/2018  . Gastrointestinal hemorrhage   . 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 Port-A-Cath in place 03/27/2018  . 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 Bilateral pulmonary embolism ([place]) 03/10/2018  . Occult blood in stools 03/10/2018  . Tachycardia 01/31/2017  . Controlled diabetes mellitus type II without complication ([place]) 09/32/3557  . Hypertension 06/05/2014  . Irritable bowel syndrome 03/30/2010  . Dysmetabolic syndrome X 32/20/2542  . BACK PAIN WITH RADICULOPATHY 06/09/2007  . Hyperlipidemia 05/12/2007  . ALLERGIC RHINITIS 05/12/2007    Past Surgical History:  Procedure Laterality Date  . BIOPSY  03/15/2018   Procedure: BIOPSY;  Surgeon: Milus Banister, MD;  Location: [name];  Service: Endoscopy;;  . BIOPSY  03/01/2019   Procedure: BIOPSY;  Surgeon: Ladene Artist, MD;  Location: [facility];  Service: Endoscopy;;  . BOWEL RESECTION N/A 08/25/2018   Procedure: LAPROSCOPIC LOOP ILEOSTOMY;  Surgeon: Greer Pickerel, MD;  Location: [facility] ORS;  Service: General;  Laterality: N/A;  . CLEFT PALATE REPAIR    . COLONOSCOPY N/A 03/15/2018   Procedure: COLONOSCOPY;  Surgeon: Milus Banister, MD;  Location: [facility];  Service: Endoscopy;  Laterality: N/A;  . COLONOSCOPY N/A 08/20/2018   Procedure: COLONOSCOPY;  Surgeon: Irene Shipper, MD;  Location: [facility];  Service: Endoscopy;  Laterality: N/A;  . deviated septum repair     slight improvement  .  ESOPHAGOGASTRODUODENOSCOPY N/A 03/15/2018   Procedure: ESOPHAGOGASTRODUODENOSCOPY (EGD);  Surgeon: Milus Banister, MD;  Location: [name];  Service: Endoscopy;  Laterality: N/A;  . FLEXIBLE SIGMOIDOSCOPY N/A 03/01/2019   Procedure: FLEXIBLE SIGMOIDOSCOPY;  Surgeon: Ladene Artist, MD;  Location: [facility];  Service: Endoscopy;  Laterality: N/A;  . ILEOSCOPY N/A 03/01/2019   Procedure: ILEOSCOPY THROUGH STOMA;  Surgeon: Ladene Artist, MD;  Location: [facility];   Service: Endoscopy;  Laterality: N/A;  . IR IMAGING GUIDED PORT INSERTION  03/17/2018  . LAPAROSCOPY N/A 08/25/2018   Procedure: LAPAROSCOPY DIAGNOSTIC;  Surgeon: Greer Pickerel, MD;  Location: [facility];  Service: General;  Laterality: N/A;  . TONSILLECTOMY          Home Medications    Prior to Admission medications   Medication Sig Start Date End Date Taking? Authorizing Provider  acetaminophen (TYLENOL) 325 MG tablet Take 2 tablets (650 mg total) by mouth every 6 (six) hours as needed for mild pain (or Fever >/= 101). 03/06/19   Arrien, Jimmy Picket, MD  apixaban (ELIQUIS) 5 MG TABS tablet DO NOT TAKE UNTIL SEEN BY YOUR ONCOLOGIST Patient taking differently: Take 5 mg by mouth 2 (two) times daily. DO NOT TAKE UNTIL SEEN BY YOUR ONCOLOGIST 01/16/19   Oretha Milch D, MD  B Complex Vitamins (VITAMIN B COMPLEX PO) Take by mouth.    [provider]  blood glucose meter kit and supplies Dispense based on patient and insurance preference. Use daily as directed. (E11.9). 03/31/18   [name], MD  citalopram (CELEXA) 20 MG tablet Take 1 tablet (20 mg total) by mouth daily. 04/17/19 04/11/20  [name], MD  dexamethasone (DECADRON) 1 MG tablet Take 1 mg by mouth every morning.    [provider]  dronabinol (MARINOL) 5 MG capsule Take 1 capsule (5 mg total) by mouth 2 (two) times daily before lunch and supper. 03/06/19   Arrien, Jimmy Picket, MD  glucose blood (FREESTYLE TEST STRIPS) test strip Use to check blood sugar daily 03/30/18   [name], MD  HYDROcodone-acetaminophen [facility]) 10-325 MG tablet Take 1 tablet by mouth every 6 (six) hours as needed for moderate pain or severe pain. 03/22/19   [name], MD  magnesium oxide (MAG-OX) 400 (241.3 Mg) MG tablet Take 1 tablet (400 mg total) by mouth 2 (two) times daily. 03/27/19   [name], MD  Zinc Sulfate 220 (50 Zn) MG TABS Take 1 tablet by mouth 2 (two) times daily. 03/06/19   [provider]    Family History Family History  Problem Relation Age of Onset  . Lung cancer Mother        smoker  . Brain cancer Mother        metastasis  . AAA (abdominal aortic aneurysm) Father        smoker    Social History Social History   Tobacco Use  . Smoking status: Never Smoker  . Smokeless tobacco: Never Used  Substance Use Topics  . Alcohol use: Yes    Comment: occasional  . Drug use: No     Allergies   Ciprofloxacin   Review of Systems Review of Systems  All other systems reviewed and are negative.    Physical Exam Updated Vital Signs BP 120/85 (BP Location: Left Arm)   Pulse 88   Temp 99.6 F (37.6 C) (Oral)   Resp 18   SpO2 100%   Physical Exam Vitals signs and nursing note  reviewed.  Constitutional:      General: He is not in acute distress.    Appearance: Normal appearance. He is well-developed. He is not toxic-appearing.  HENT:     Head: Normocephalic and atraumatic.  Eyes:     General: Lids are normal.     Conjunctiva/sclera: Conjunctivae normal.     Pupils: Pupils are equal, round, and reactive to light.  Neck:     Musculoskeletal: Normal range of motion and neck supple.     Thyroid: No thyroid mass.     Trachea: No tracheal deviation.  Cardiovascular:     Rate and Rhythm: Normal rate and regular rhythm.     Heart sounds: Normal heart sounds. No murmur. No gallop.   Pulmonary:     Effort: Pulmonary effort is normal. No respiratory distress.     Breath sounds: Normal breath sounds. No stridor. No decreased breath sounds, wheezing, rhonchi or rales.  Abdominal:     General: Bowel sounds are normal. There is no distension.     Palpations: Abdomen is soft.     Tenderness: There is no abdominal tenderness. There is no rebound.    Musculoskeletal: Normal range of motion.        General: No tenderness.  Skin:    General: Skin is warm and dry.     Findings: No abrasion or rash.  Neurological:     Mental Status: He is alert  and oriented to person, place, and time.     GCS: GCS eye subscore is 4. GCS verbal subscore is 5. GCS motor subscore is 6.     Cranial Nerves: No cranial nerve deficit.     Sensory: No sensory deficit.  Psychiatric:        Speech: Speech normal.        Behavior: Behavior normal.      ED Treatments / Results  Labs (all labs ordered are listed, but only abnormal results are displayed) Labs Reviewed  COMPREHENSIVE METABOLIC PANEL - Abnormal; Notable for the following components:      Result Value   Sodium 134 (*)    Potassium 3.4 (*)    Glucose, Bld 236 (*)    Creatinine, Ser 0.52 (*)    Albumin 2.8 (*)    ALT 58 (*)    All other components within normal limits  CBC - Abnormal; Notable for the following components:   WBC 0.6 (*)    RBC 2.47 (*)    Hemoglobin 8.1 (*)    HCT 25.8 (*)    MCV 104.5 (*)    RDW 16.3 (*)    All other components within normal limits  CULTURE, BLOOD (ROUTINE X 2)  CULTURE, BLOOD (ROUTINE X 2)  SARS CORONAVIRUS 2  LIPASE, BLOOD  URINALYSIS, ROUTINE W REFLEX MICROSCOPIC  LACTIC ACID, PLASMA  LACTIC ACID, PLASMA    EKG None  Radiology No results found.  Procedures Procedures (including critical care time)  Medications Ordered in ED Medications  sodium chloride flush (NS) 0.9 % injection 3 mL (has no administration in time range)  0.9 %  sodium chloride infusion (has no administration in time range)  ceFEPIme (MAXIPIME) 2 g in sodium chloride 0.9 % 100 mL IVPB (has no administration in time range)  metroNIDAZOLE (FLAGYL) IVPB 500 mg (has no administration in time range)  vancomycin (VANCOCIN) IVPB 1000 mg/200 mL premix (has no administration in time range)     Initial Impression / Assessment and Plan / ED Course  I have  reviewed the triage vital signs and the nursing notes.  Pertinent labs & imaging results that were available during my care of the patient were reviewed by me and considered in my medical decision making (see chart for  details).        Patient with evidence of intra-abdominal abscess which is unchanged.  Does have neutropenia.  Started on IV antibiotics and will be admitted to the hospital service  Final Clinical Impressions(s) / ED Diagnoses   Final diagnoses:  None    ED Discharge Orders    None       Lacretia Leigh, MD 05/04/19 1950

## 2019-05-04 NOTE — H&P (Signed)
History and Physical   Mark Frey QQV:[id number] DOB: 11/04/1955 DOA: 05/04/2019  Referring MD/NP/PA: Dr. Zenia Resides  PCP: [name], MD   Outpatient Specialists: [name]  Patient coming from: Home  Chief Complaint: Abdominal pain  HPI: Mark Frey is a 63 y.o. male with medical history significant of lymphoma, diabetes, hypertension, hyperlipidemia who was on chemotherapy and has completed chemotherapy.  He came in with 2 days of fever and abdominal pain.  He has had temperature up to 102.  Associated with diffuse abdominal pain but more on the left side.  Pain was a 7 out of 10.  Persistent at rest.  No worsening or relieving factors.  Denied any dysuria.  Denied any nausea vomiting or diarrhea.  No hematemesis or melena.  Patient was seen in the ER and was found to have mild intra-abdominal abscess with abdominal tenderness.  Patient is being admitted to the hospital for treatment.  Patient is therefore being admitted to the hospital for treatment.   ED Course: Temperature is 100 blood pressure is 120/85 pulse 115 respirate of 19 oxygen sat 100% room air.  Urinalysis is negative lactic acid 1.2.  Chest x-ray showed no active disease.  CT abdomen pelvis showed persistent catheter of the descending colon with a left lateral wall with abscess within the left lateral peritoneal air reflection of 2.7 x 1.7 cm.  No bowel obstruction.  Also bilateral nonobstructive renal calculi.  Sodium 134 potassium 3.4 chloride 25.  Glucose is 235.  Albumin 2.8.  White count is 0.6 hemoglobin 8.1 and platelet count of 157.  Patient initiated on antibiotics for febrile neutropenia and being admitted  Review of Systems: As per HPI otherwise 10 point review of systems negative.    Past Medical History:  Diagnosis Date  . ALLERGIC RHINITIS   . Cancer ([place])    Lymphoma   . Diabetes mellitus   . Hyperlipidemia     Past Surgical History:  Procedure Laterality Date  . BIOPSY  03/15/2018   Procedure:  BIOPSY;  Surgeon: Milus Banister, MD;  Location: [name];  Service: Endoscopy;;  . BIOPSY  03/01/2019   Procedure: BIOPSY;  Surgeon: Ladene Artist, MD;  Location: [facility];  Service: Endoscopy;;  . BOWEL RESECTION N/A 08/25/2018   Procedure: LAPROSCOPIC LOOP ILEOSTOMY;  Surgeon: Greer Pickerel, MD;  Location: [facility];  Service: General;  Laterality: N/A;  . CLEFT PALATE REPAIR    . COLONOSCOPY N/A 03/15/2018   Procedure: COLONOSCOPY;  Surgeon: Milus Banister, MD;  Location: [facility];  Service: Endoscopy;  Laterality: N/A;  . COLONOSCOPY N/A 08/20/2018   Procedure: COLONOSCOPY;  Surgeon: Irene Shipper, MD;  Location: [facility];  Service: Endoscopy;  Laterality: N/A;  . deviated septum repair     slight improvement  . ESOPHAGOGASTRODUODENOSCOPY N/A 03/15/2018   Procedure: ESOPHAGOGASTRODUODENOSCOPY (EGD);  Surgeon: Milus Banister, MD;  Location: [name];  Service: Endoscopy;  Laterality: N/A;  . FLEXIBLE SIGMOIDOSCOPY N/A 03/01/2019   Procedure: FLEXIBLE SIGMOIDOSCOPY;  Surgeon: Ladene Artist, MD;  Location: [facility];  Service: Endoscopy;  Laterality: N/A;  . ILEOSCOPY N/A 03/01/2019   Procedure: ILEOSCOPY THROUGH STOMA;  Surgeon: Ladene Artist, MD;  Location: [facility];  Service: Endoscopy;  Laterality: N/A;  . IR IMAGING GUIDED PORT INSERTION  03/17/2018  . LAPAROSCOPY N/A 08/25/2018   Procedure: LAPAROSCOPY DIAGNOSTIC;  Surgeon: Greer Pickerel, MD;  Location: [facility];  Service: General;  Laterality: N/A;  . TONSILLECTOMY  reports that he has never smoked. He has never used smokeless tobacco. He reports current alcohol use. He reports that he does not use drugs.  Allergies  Allergen Reactions  . Ciprofloxacin Other (See Comments)    Leg tingling    Family History  Problem Relation Age of Onset  . Lung cancer Mother        smoker  . Brain cancer Mother        metastasis  . AAA (abdominal aortic aneurysm) Father        smoker     Prior to  Admission medications   Medication Sig Start Date End Date Taking? Authorizing Provider  acetaminophen (TYLENOL) 325 MG tablet Take 2 tablets (650 mg total) by mouth every 6 (six) hours as needed for mild pain (or Fever >/= 101). 03/06/19  Yes Arrien, Jimmy Picket, MD  apixaban (ELIQUIS) 5 MG TABS tablet DO NOT TAKE UNTIL SEEN BY YOUR ONCOLOGIST Patient taking differently: Take 5 mg by mouth 2 (two) times daily. DO NOT TAKE UNTIL SEEN BY YOUR ONCOLOGIST 01/16/19  Yes Oretha Milch D, MD  B Complex Vitamins (VITAMIN B COMPLEX PO) Take by mouth.   Yes [provider]  citalopram (CELEXA) 20 MG tablet Take 1 tablet (20 mg total) by mouth daily. 04/17/19 04/11/20 Yes [name], MD  dexamethasone (DECADRON) 1 MG tablet Take 1 mg by mouth every morning.   Yes [provider]  dronabinol (MARINOL) 5 MG capsule Take 1 capsule (5 mg total) by mouth 2 (two) times daily before lunch and supper. Patient taking differently: Take 5 mg by mouth daily.  03/06/19  Yes Arrien, Jimmy Picket, MD  HYDROcodone-acetaminophen [facility]) 10-325 MG tablet Take 1 tablet by mouth every 6 (six) hours as needed for moderate pain or severe pain. 03/22/19  Yes [name], MD  magnesium oxide (MAG-OX) 400 (241.3 Mg) MG tablet Take 1 tablet (400 mg total) by mouth 2 (two) times daily. 03/27/19  Yes [name], MD  Zinc Sulfate 220 (50 Zn) MG TABS Take 1 tablet by mouth 2 (two) times daily. 03/06/19  Yes [provider]  blood glucose meter kit and supplies Dispense based on patient and insurance preference. Use daily as directed. (E11.9). 03/31/18   [name], MD  glucose blood (FREESTYLE TEST STRIPS) test strip Use to check blood sugar daily 03/30/18   [name], MD    Physical Exam: Vitals:   05/04/19 1532 05/04/19 1727 05/04/19 1815 05/04/19 1856  BP: 115/70 120/85 114/78 107/69  Pulse: (!) 115 88 82 87  Resp: 18 18 18 19   Temp: 99.6 F (37.6 C)     TempSrc:  Oral     SpO2: 98% 100% 99% 100%      Constitutional: NAD, calm, comfortable Vitals:   05/04/19 1532 05/04/19 1727 05/04/19 1815 05/04/19 1856  BP: 115/70 120/85 114/78 107/69  Pulse: (!) 115 88 82 87  Resp: 18 18 18 19   Temp: 99.6 F (37.6 C)     TempSrc: Oral     SpO2: 98% 100% 99% 100%   Eyes: PERRL, lids and conjunctivae normal ENMT: Mucous membranes are moist. Posterior pharynx clear of any exudate or lesions.Normal dentition.  Neck: normal, supple, no masses, no thyromegaly Respiratory: clear to auscultation bilaterally, no wheezing, no crackles. Normal respiratory effort. No accessory muscle use.  Cardiovascular: Regular rate and rhythm, no murmurs / rubs / gallops. No extremity edema. 2+ pedal pulses. No carotid bruits.  Abdomen:  Left lower quadrant abdominal tenderness, no masses palpated. No hepatosplenomegaly. Bowel sounds positive.  Musculoskeletal: no clubbing / cyanosis. No joint deformity upper and lower extremities. Good ROM, no contractures. Normal muscle tone.  Skin: no rashes, lesions, ulcers. No induration Neurologic: CN 2-12 grossly intact. Sensation intact, DTR normal. Strength 5/5 in all 4.  Psychiatric: Normal judgment and insight. Alert and oriented x 3. Normal mood.     Labs on Admission: I have personally reviewed following labs and imaging studies  CBC: Recent Labs  Lab 05/04/19 1617  WBC 0.6*  HGB 8.1*  HCT 25.8*  MCV 104.5*  PLT 720   Basic Metabolic Panel: Recent Labs  Lab 05/04/19 1617  NA 134*  K 3.4*  CL 98  CO2 25  GLUCOSE 236*  BUN 10  CREATININE 0.52*  CALCIUM 9.1   GFR: Estimated Creatinine Clearance: 95.7 mL/min (A) (by C-G formula based on SCr of 0.52 mg/dL (L)). Liver Function Tests: Recent Labs  Lab 05/04/19 1617  AST 25  ALT 58*  ALKPHOS 96  BILITOT 0.5  PROT 6.6  ALBUMIN 2.8*   Recent Labs  Lab 05/04/19 1617  LIPASE 21   No results for input(s): AMMONIA in the last 168 hours. Coagulation Profile: No  results for input(s): INR, PROTIME in the last 168 hours. Cardiac Enzymes: No results for input(s): CKTOTAL, CKMB, CKMBINDEX, TROPONINI in the last 168 hours. BNP (last 3 results) No results for input(s): PROBNP in the last 8760 hours. HbA1C: No results for input(s): HGBA1C in the last 72 hours. CBG: No results for input(s): GLUCAP in the last 168 hours. Lipid Profile: No results for input(s): CHOL, HDL, LDLCALC, TRIG, CHOLHDL, LDLDIRECT in the last 72 hours. Thyroid Function Tests: No results for input(s): TSH, T4TOTAL, FREET4, T3FREE, THYROIDAB in the last 72 hours. Anemia Panel: No results for input(s): VITAMINB12, FOLATE, FERRITIN, TIBC, IRON, RETICCTPCT in the last 72 hours. Urine analysis:    Component Value Date/Time   COLORURINE AMBER (A) 05/04/2019 1730   APPEARANCEUR HAZY (A) 05/04/2019 1730   LABSPEC 1.027 05/04/2019 1730   PHURINE 5.0 05/04/2019 1730   GLUCOSEU 50 (A) 05/04/2019 1730   HGBUR NEGATIVE 05/04/2019 1730   HGBUR negative 01/20/2009 0840   BILIRUBINUR NEGATIVE 05/04/2019 1730   BILIRUBINUR Small 01/30/2018 1652   KETONESUR NEGATIVE 05/04/2019 1730   PROTEINUR 30 (A) 05/04/2019 1730   UROBILINOGEN 2.0 (A) 01/30/2018 1652   UROBILINOGEN 0.2 01/20/2009 0840   NITRITE NEGATIVE 05/04/2019 1730   LEUKOCYTESUR NEGATIVE 05/04/2019 1730   Sepsis Labs: @LABRCNTIP (procalcitonin:4,lacticidven:4) )No results found for this or any previous visit (from the past 240 hour(s)).   Radiological Exams on Admission: Ct Abdomen Pelvis W Contrast  Addendum Date: 05/04/2019   ADDENDUM REPORT: 05/04/2019 19:43 ADDENDUM: The appearance of the LEFT LATERAL abdominal wall abscess is stable compared with most recent comparison, 05/02/2019. The salient findings were discussed with Lacretia Leigh on 05/04/2019 at 7:43 pm. Electronically Signed   By: Nolon Nations M.D.   On: 05/04/2019 19:43   Result Date: 05/04/2019 CLINICAL DATA:  Patient states he was called by his physician to  come to the ED for an abscess in his colon. Patient has been having a low grade fever at home patient denies any pain at this time. Follow CT scan from 8/19. Per patient he has back pain. Patient's history of lymphoma and bowel obstruction. EXAM: CT ABDOMEN AND PELVIS WITH CONTRAST TECHNIQUE: Multidetector CT imaging of the abdomen and pelvis was performed using the standard protocol  following bolus administration of intravenous contrast. CONTRAST:  123m OMNIPAQUE IOHEXOL 300 MG/ML  SOLN COMPARISON:  02/27/2019, 05/02/2019, and multiple prior studies FINDINGS: Lower chest: Subsegmental atelectasis at the lung bases. Hepatobiliary: Small cysts are identified. Gallbladder is present. Note is made of a Phrygian cap. No evidence for acute cholecystitis. Pancreas: Unremarkable. No pancreatic ductal dilatation or surrounding inflammatory changes. Spleen: Normal in size without focal abnormality. Adrenals/Urinary Tract: Normal adrenal glands. Small bilateral renal cysts. Intrarenal calculus in the RIGHT kidney measures 7 millimeters. Punctate calcification is identified in the UPPER pole of the LEFT kidney, 2 millimeters in diameter. No solid renal mass or hydronephrosis. The ureters are unremarkable. Stomach/Bowel: Stomach is unremarkable. RIGHT LOWER QUADRANT loop ileostomy is nonobstructing. There continues to be significant tethering and inflammatory change in the central pelvis and LEFT LOWER QUADRANT, extending to the LEFT pelvic sidewall. The cecum and sigmoid colon appear to be adherent. There are coarse calcifications in the region of the soft tissue between the cecum and sigmoid colon. There is persistent tethering of the descending colon with the LEFT LATERAL abdominal wall. There is persistent focal low-attenuation within the LEFT LATERAL peritoneal reflection, consistent with abscess, now measuring 2.7 x 1.7 centimeters. This is best seen on image 35 of series 7. There is associated subcutaneous edema  overlying the LEFT LATERAL abdominal wall. Vascular/Lymphatic: There is atherosclerotic calcification of the abdominal aorta. No aneurysm. No retroperitoneal or mesenteric adenopathy. Reproductive: Prostate is unremarkable. Other: None Musculoskeletal: Degenerative changes are seen in the LOWER thoracic and lumbar spine. No suspicious lytic or blastic lesions are identified. IMPRESSION: 1. Persistent tethering of the descending colon with the LEFT LATERAL abdominal wall, associated with abscess within the LEFT LATERAL peritoneal reflection measuring 2.7 x 1.7 centimeters. 2. No evidence for bowel obstruction. 3. Bilateral nonobstructing intrarenal calculi. 4. Hepatic and renal cysts. 5.  Aortic atherosclerosis.  (ICD10-I70.0) 6. Degenerative changes in the LOWER thoracic and lumbar spine. Electronically Signed: By: ENolon NationsM.D. On: 05/04/2019 19:31   Dg Chest Port 1 View  Result Date: 05/04/2019 CLINICAL DATA:  Cough, low-grade fever, diabetes mellitus, history lymphoma EXAM: PORTABLE CHEST 1 VIEW COMPARISON:  Portable exam 1820 hours compared to 01/01/2019 FINDINGS: RIGHT jugular Port-A-Cath with tip projecting over SVC. Mediastinal contours and pulmonary vascularity normal. Bibasilar scarring unchanged. No acute infiltrate, pleural effusion or pneumothorax. No acute osseous findings. IMPRESSION: Bibasilar scarring. No acute abnormalities. Electronically Signed   By: MLavonia DanaM.D.   On: 05/04/2019 18:48    EKG: Independently reviewed.  It shows sinus tachycardia.  Assessment/Plan Principal Problem:   Intra-abdominal abscess (HCC) Active Problems:   Hyperlipidemia   Irritable bowel syndrome   Controlled diabetes mellitus type II without complication (HCC)   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HCC)   Pancytopenia (HCC)   Neutropenia (HCC)   Depression, major, single episode, complete remission ([place]     #1 intra-abdominal abscess: Patient will be admitted and initiated on IV cefepime as  well as metronidazole for intra-abdominal abscess.  It appears too small to be drained percutaneously or surgically per surgery.  We will treat symptomatically and reevaluate.  Cultures pending.  #2 febrile neutropenia: Patient is profoundly neutropenic with a neutrophil of 0.6 and presence of fever.  We will add vancomycin to current antibiotic regimen.  Follow blood cultures.  #3 history of lymphoma: Patient has been on chemotherapy but not recently.  Completed chemo and radiation.  Defer to oncology for further treatment  #4 diabetes: Sliding scale insulin will be initiated  with treatment.  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  May hold apixaban if surgery or percutaneous drainage is contemplated  #7 hypokalemia: Continue to replete  DVT prophylaxis: Apixaban Code Status: Full code Family Communication: No family at bedside discussed with patient Disposition Plan: Home Consults called: General surgery consulted by the ER.  No surgery planned Admission status: Inpatient  Severity of Illness: The appropriate patient status for this patient is INPATIENT. Inpatient status is judged to be reasonable and necessary in order to provide the required intensity of service to ensure the patient's safety. The patient's presenting symptoms, physical exam findings, and initial radiographic and laboratory data in the context of their chronic comorbidities is felt to place them at high risk for further clinical deterioration. Furthermore, it is not anticipated that the patient will be medically stable for discharge from the hospital within 2 midnights of admission. The following factors support the patient status of inpatient.   " The patient's presenting symptoms include abdominal pain and fever. " The worrisome physical exam findings include abdominal tenderness. " The initial radiographic and laboratory data are worrisome because of CT abdomen pelvis  showed intra-abdominal abscess. " The chronic co-morbidities include history of lymphoma.   * I certify that at the point of admission it is my clinical judgment that the patient will require inpatient hospital care spanning beyond 2 midnights from the point of admission due to high intensity of service, high risk for further deterioration and high frequency of surveillance required.Barbette Merino MD [facility] Pager [phone]  If 7PM-7AM, please contact night-coverage [email or web address] Password [facility]  05/04/2019, 8:41 PM

## 2019-05-04 NOTE — Progress Notes (Signed)
Patient: Mark Frey MRN: [id number] DOB: 15-Sep-1955 PCP: [name], MD     I connected with [name] on 05/04/19 at 9:40 am by a video enabled telemedicine application and verified that I am speaking with the correct person using two identifiers.  Location patient: Home Location provider: [facility], Office Persons participating in this virtual visit: Karlon Schlafer and Dr. Rogers Blocker  I discussed the limitations of evaluation and management by telemedicine and the availability of in person appointments. The patient expressed understanding and agreed to proceed.   Subjective:  Chief Complaint  Patient presents with  . Fever    HPI: The patient is a 63 y.o. male who presents today for fever since Tuesday. Complicated history with hospitalization from may 27 to June 23rd with d/c to rehab facility. Had stricture of colon and colitis. Cultures showed morganella as well as pseudomonas and later grew an atypical fungus: candida lustitaniae. Declined surgery during hospital, but did have IR place a percutaneous drainage for abscess on June 11th. Had f/u CT a few days ago. Finished his anti fungals about 2 weeks ago and is off all antibiotics. He has not been on abx since discharge from hospital. Also has hx of burkitt's lymphoma, no current chemo treatment and no clinical or lab progression of his burkitt's lymphoma per oncology in July of this year.  Fever on Tuesday was up to 102. Tylenol was given and this helps get temperature back to normal. He has not had fever the last few days. This morning he had a temperature to 101.2. His wife gave him tylenol again and temperature back down to 100. Of note, he is also on steroids for appetite stimulation. He denies any urinary symptoms specifically increased frequency, urgency, dysuria, color change or odor. He denies any upper respiratory issues specifically denying ear pain, throat pain, sinus congestion, rhinorrhea. Denies shortness of  breath, cough, wheezing, dyspnea. No abdominal pain, n/v/d. Stool appears same in his ostomy bag with no visible blood or change in consistency.   Recent CT on 05/02/2019 reviewed.  1. Persistent colitis involving the descending/sigmoid colon with decrease in the amount of pericolonic fluid. A small organized fluid collection in the adjacent mesentery appears stable. A second adjacent tiny organized fluid collection along the left lateral abdominal wall appears new. Cecum and adjacent bowel loops appear tethered to the inflamed sigmoid colon. 2. Right renal stone.  Tiny left bladder stone/calcification. 3.  Aortic atherosclerosis (ICD10-170.0).  Review of Systems  Constitutional: Positive for fatigue and fever. Negative for chills.  HENT: Negative for congestion, postnasal drip, rhinorrhea and sore throat.   Eyes: Negative for visual disturbance.  Respiratory: Negative for cough, chest tightness and shortness of breath.   Cardiovascular: Negative.   Gastrointestinal: Negative for abdominal pain, diarrhea, nausea and vomiting.  Genitourinary: Negative for dysuria.  Musculoskeletal: Negative for arthralgias and myalgias.  Neurological: Negative for dizziness and headaches.  Psychiatric/Behavioral: Negative for sleep disturbance.    Allergies Patient is allergic to ciprofloxacin.  Past Medical History Patient  has a past medical history of ALLERGIC RHINITIS, Cancer ([place]), Diabetes mellitus, and Hyperlipidemia.  Surgical History Patient  has a past surgical history that includes Tonsillectomy; Cleft palate repair; deviated septum repair; IR IMAGING GUIDED PORT INSERTION (03/17/2018); Colonoscopy (N/A, 03/15/2018); Esophagogastroduodenoscopy (N/A, 03/15/2018); biopsy (03/15/2018); Colonoscopy (N/A, 08/20/2018); laparoscopy (N/A, 08/25/2018); Bowel resection (N/A, 08/25/2018); biopsy (03/01/2019); Ileoscopy (N/A, 03/01/2019); and Flexible sigmoidoscopy (N/A, 03/01/2019).  Family History Pateint's  family history includes AAA (abdominal aortic aneurysm)  in his father; Brain cancer in his mother; Lung cancer in his mother.  Social History Patient  reports that he has never smoked. He has never used smokeless tobacco. He reports current alcohol use. He reports that he does not use drugs.    Objective: Vitals:   05/04/19 0936  Temp: 100 F (37.8 C)  TempSrc: Oral  Weight: 163 lb (73.9 kg)  Height: 5' 9"  (1.753 m)    Body mass index is 24.07 kg/m.  Physical Exam Vitals signs reviewed.  Constitutional:      Appearance: Normal appearance.  HENT:     Head: Normocephalic and atraumatic.  Pulmonary:     Effort: Pulmonary effort is normal.  Neurological:     General: No focal deficit present.     Mental Status: He is alert and oriented to person, place, and time.  Psychiatric:        Behavior: Behavior normal.        Assessment/plan: 1. Fever, unspecified fever cause Concern for possible new abscess off CT with fever. Complex patient with lymphoma history as well. Recommended ER and to call GI as well. Likely needs IV abx as well as possible drain put in. They understood need to go to ER.   2. Pancolitis ([place]) Reviewed CT with them. Recommended ER and to call GI.   Return if symptoms worsen or fail to improve.  Orma Flaming, MD [facility]  05/04/2019

## 2019-05-05 DIAGNOSIS — K651 Peritoneal abscess: Secondary | ICD-10-CM

## 2019-05-05 LAB — BLOOD CULTURE ID PANEL (REFLEXED)

## 2019-05-05 LAB — SARS CORONAVIRUS 2 (TAT 6-24 HRS): SARS Coronavirus 2: NEGATIVE

## 2019-05-05 LAB — IRON AND TIBC
Iron: 57 ug/dL (ref 45–182)
Saturation Ratios: 36 % (ref 17.9–39.5)
TIBC: 157 ug/dL — ABNORMAL LOW (ref 250–450)
UIBC: 100 ug/dL

## 2019-05-05 LAB — LACTATE DEHYDROGENASE: LDH: 57 U/L — ABNORMAL LOW (ref 98–192)

## 2019-05-05 LAB — COMPREHENSIVE METABOLIC PANEL
ALT: 45 U/L — ABNORMAL HIGH (ref 0–44)
AST: 17 U/L (ref 15–41)
Albumin: 2.5 g/dL — ABNORMAL LOW (ref 3.5–5.0)
Alkaline Phosphatase: 74 U/L (ref 38–126)
Anion gap: 9 (ref 5–15)
BUN: 9 mg/dL (ref 8–23)
CO2: 24 mmol/L (ref 22–32)
Calcium: 8.4 mg/dL — ABNORMAL LOW (ref 8.9–10.3)
Chloride: 102 mmol/L (ref 98–111)
Creatinine, Ser: 0.39 mg/dL — ABNORMAL LOW (ref 0.61–1.24)
GFR calc Af Amer: >60 mL/min (ref >60)
GFR calc non Af Amer: >60 mL/min (ref >60)
Glucose, Bld: 110 mg/dL — ABNORMAL HIGH (ref 70–99)
Potassium: 3.1 mmol/L — ABNORMAL LOW (ref 3.5–5.1)
Sodium: 135 mmol/L (ref 135–145)
Total Bilirubin: 0.6 mg/dL (ref 0.3–1.2)
Total Protein: 5.9 g/dL — ABNORMAL LOW (ref 6.5–8.1)

## 2019-05-05 LAB — GLUCOSE, CAPILLARY
Glucose-Capillary: 104 mg/dL — ABNORMAL HIGH (ref 70–99)
Glucose-Capillary: 99 mg/dL (ref 70–99)
Glucose-Capillary: 132 mg/dL — ABNORMAL HIGH (ref 70–99)
Glucose-Capillary: 106 mg/dL — ABNORMAL HIGH (ref 70–99)

## 2019-05-05 LAB — CBC
HCT: 22.3 % — ABNORMAL LOW (ref 39.0–52.0)
Hemoglobin: 6.8 g/dL — CL (ref 13.0–17.0)
MCH: 32.9 pg (ref 26.0–34.0)
MCHC: 30.5 g/dL (ref 30.0–36.0)
MCV: 107.7 fL — ABNORMAL HIGH (ref 80.0–100.0)
Platelets: 138 10*3/uL — ABNORMAL LOW (ref 150–400)
RBC: 2.07 MIL/uL — ABNORMAL LOW (ref 4.22–5.81)
RDW: 16.3 % — ABNORMAL HIGH (ref 11.5–15.5)
WBC: 0.8 10*3/uL — CL (ref 4.0–10.5)
nRBC: 0 % (ref 0.0–0.2)

## 2019-05-05 LAB — FOLATE: Folate: 19.8 ng/mL (ref >5.9)

## 2019-05-05 LAB — HEMOGLOBIN AND HEMATOCRIT, BLOOD
HCT: 28.1 % — ABNORMAL LOW (ref 39.0–52.0)
Hemoglobin: 8.8 g/dL — ABNORMAL LOW (ref 13.0–17.0)

## 2019-05-05 LAB — DIRECT ANTIGLOBULIN TEST (NOT AT ARMC)
DAT, IgG: NEGATIVE
DAT, complement: NEGATIVE

## 2019-05-05 LAB — FERRITIN: Ferritin: 2764 ng/mL — ABNORMAL HIGH (ref 24–336)

## 2019-05-05 LAB — PREPARE RBC (CROSSMATCH)

## 2019-05-05 LAB — VITAMIN B12: Vitamin B-12: 320 pg/mL (ref 180–914)

## 2019-05-05 MED ORDER — POTASSIUM CHLORIDE CRYS ER 20 MEQ PO TBCR
40.0000 meq | EXTENDED_RELEASE_TABLET | ORAL | Status: AC
  Administered 2019-05-05 (×2): 40 meq via ORAL
  Filled 2019-05-05 (×2): qty 2

## 2019-05-05 MED ORDER — TBO-FILGRASTIM 480 MCG/0.8ML ~~LOC~~ SOSY
480.0000 ug | PREFILLED_SYRINGE | SUBCUTANEOUS | Status: AC
  Administered 2019-05-05 – 2019-05-08 (×3): 480 ug via SUBCUTANEOUS
  Filled 2019-05-05 (×4): qty 0.8

## 2019-05-05 MED ORDER — SODIUM CHLORIDE 0.9% IV SOLUTION
Freq: Once | INTRAVENOUS | Status: AC
  Administered 2019-05-05: 12:00:00 via INTRAVENOUS

## 2019-05-05 NOTE — Progress Notes (Signed)
During admission assessment, RN asked pt to verify DNR status before placement of the DNR wristband. Pt stated he did not make this request. On-call MD Jonelle Sidle paged and he changed Code status to Full code.

## 2019-05-05 NOTE — Progress Notes (Signed)
PHARMACY - PHYSICIAN COMMUNICATION CRITICAL VALUE ALERT - BLOOD CULTURE IDENTIFICATION (BCID)  ERBIE ARMENT is an 63 y.o. male who presented to [facility] on 05/04/2019 with a chief complaint of FN  Assessment:  1 bottle positive for Ecoli bacteremia  Name of physician (or Provider) Contacted: n/a  Current antibiotics: Cefepime 2g IV q8  Changes to prescribed antibiotics recommended:  No changes  No results found for this or any previous visit.  Kara Mead 05/05/2019  11:58 AM

## 2019-05-05 NOTE — Progress Notes (Signed)
Critical from Lab:  WBC 0.8 HGB 6.8  On-call X. Blount notified via text page. Will continue to monitor.

## 2019-05-05 NOTE — Progress Notes (Signed)
Patient Demographics:    Mark Frey, is a 63 y.o. male, DOB - August 13, 1956, QHU:[id number]  Admit date - 05/04/2019   Admitting Physician Mark Reach, MD  Outpatient Primary MD for the patient is Mark Olp, MD  LOS - 1   Chief Complaint  Patient presents with   Abscess   Fever        Subjective:    Mark Frey today has  no emesis,  No chest pain, fevers and abdominal pain appears to be improving  Assessment  & Plan :    Principal Problem:   Intra-abdominal abscess [facility]) Active Problems:   Hyperlipidemia   Irritable bowel syndrome   Controlled diabetes mellitus type II without complication (HCC)   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HCC)   Pancytopenia (HCC)   Neutropenia (HCC)   Depression, major, single episode, complete remission Mark Frey)  Brief Summary -63 year old male status post prior chemoradiation treatment for Burkitt's lymphoma stage II ED initially diagnosed in July 2019, as well as history of DM, HLD and HTN admitted on 05/04/2019 with concerns about intra-abdominal abscess  A/P 1) E. coli Bacteremia and Sepsis--blood cultures from 05/04/2019 with 1 out of 2 bottles with E. coli, continue cefepime 2 g IV every 8 hours consider de-escalating when sensitivity available -Patient apparently met sepsis criteria on admission, he had fevers at home, he was tachycardic up to 115, he had severe leukopenia, blood pressures were soft with  blood pressure of 93/58  2) intra-abdominal abscess--CT abdomen and pelvis with 2.7 x 1.7 cm abscess, continue cefepime and Flagyl as ordered... If fails to improve consider repeat CT abdomen and pelvis and possible IR consult for drainage -Discussed with Mark Frey from general surgery, no surgical intervention recommended at this time  3) leukopenia --- 0.8 from 0.6 on admission, repeat WBC with differential ordered and pending suspect due to  underlying E. coli bacteremia and intra-abdominal abscess -Discussed with Mark Frey from hematology service, advised Granix for leukopenia -Please note that at baseline patient does have some degree of pancytopenia -Peripheral smear review requested  4) anemia--hemoglobin is down to 6.8 from 8.1 on admission after IV fluids patient baseline hemoglobin usually around 8 -Transfused 2 units of packed cells for anemia -Hematology consult appreciated -Please note that at baseline patient does have some degree of pancytopenia -Anemia work-up ordered and pending -LDH and [place]ombs panel pending  5)H/o  Burkitt's lymphoma, stage IIE,initially diagnosed July 2019             (a) s/p R-EPOCH x 6, last dose 07/06/2018             (b) restaging PET 07/29/2019 shows good response but pericolonic inflammation --Usually follows with Mark Frey  6)Hx DVT/PE at 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 coli sepsis and bacteremia with intra-abdominal abscess requiring IV antibiotics*  Code Status : full  Family Communication:   NA (patient is alert, awake and coherent)  Disposition Plan  : TBD  Consults  :  Hematology/phone consult with general surgeon Mark Frey  DVT Prophylaxis  : Eliquis on hold due to worsening anemia and worsening thrombocytopenia- SCDs   Lab Results  Component Value Date   PLT 138 (L) 05/05/2019    Inpatient Medications  Scheduled Meds:  citalopram  20 mg Oral Daily   dexamethasone  1 mg Oral q morning - 10a   dronabinol  5  mg Oral Daily   insulin aspart  0-5 Units Subcutaneous QHS   insulin aspart  0-9 Units Subcutaneous TID WC   magnesium oxide  400 mg Oral BID   Tbo-Filgrastim  480 mcg Subcutaneous 1 day or 1 dose   zinc sulfate  220 mg Oral BID   Continuous Infusions:  sodium chloride 1,000 mL (05/04/19 1806)   sodium chloride 100 mL/hr at 05/05/19 1628   ceFEPime (MAXIPIME) IV 2 g (05/05/19 1241)   metronidazole 500 mg (05/05/19 1842)   PRN Meds:.acetaminophen, HYDROcodone-acetaminophen, ondansetron **OR** ondansetron (ZOFRAN) IV    Anti-infectives (From admission, onward)   Start     Dose/Rate Route Frequency Ordered Stop   05/05/19 0200  metroNIDAZOLE (FLAGYL) IVPB 500 mg     500 mg 100 mL/hr over 60 Minutes Intravenous Every 8 hours 05/04/19 2109     05/05/19 0200  ceFEPIme (MAXIPIME) 2 g in sodium chloride 0.9 % 100 mL IVPB     2 g 200 mL/hr over 30 Minutes Intravenous Every 8 hours 05/04/19 2108     05/04/19 1730  ceFEPIme (MAXIPIME) 2 g in sodium chloride 0.9 % 100 mL IVPB     2 g 200 mL/hr over 30 Minutes Intravenous  Once 05/04/19 1716 05/04/19 1853   05/04/19 1730  metroNIDAZOLE (FLAGYL) IVPB 500 mg     500 mg 100 mL/hr over 60 Minutes Intravenous  Once 05/04/19 1716 05/04/19 1926   05/04/19 1730  vancomycin (VANCOCIN) IVPB 1000 mg/200 mL premix     1,000 mg 200 mL/hr over 60 Minutes Intravenous  Once 05/04/19 1716 05/04/19 1919       Objective:   Vitals:   05/05/19 1246 05/05/19 1538 05/05/19 1540 05/05/19 1621  BP: 93/68 102/71 102/71 93/63  Pulse: 88 73 73 68  Resp: 19 18 18 18   Temp: 98.2 F (36.8 C) 97.7 F (36.5 C) 97.7 F (36.5 C) 97.7 F (36.5 C)  TempSrc:  Oral Oral Oral  SpO2:  98% 98% 98%    Wt Readings from Last 3 Encounters:  05/04/19 73.9 kg  04/17/19 73.9 kg  03/22/19 74.2 kg    Intake/Output Summary (Last 24 hours) at 05/05/2019 1850 Last data filed at 05/05/2019 1730 Gross per 24 hour  Intake 2157.36 ml  Output 1475 ml  Net 682.36 ml    Physical Exam  Gen:- Awake Alert,  In no apparent distress  HEENT:- [place].AT, No sclera icterus Neck-Supple Neck,No JVD,.  Lungs-  CTAB , fair symmetrical air movement CV- S1, S2 normal, regular , Port-A-Cath in situ Abd-  +ve B.Sounds, Abd Soft, improved lower abdominal tenderness without rebound or guarding, colostomy bag with fecal material Extremity/Skin:- No  edema, pedal pulses present  Psych-affect is appropriate, oriented x3 Neuro-no new focal deficits, no tremors   Data Review:   Micro Results Recent Results (from the  past 240 hour(s))  Blood Culture (routine x 2)     Status: None (Preliminary result)   Collection Time: 05/04/19  5:29 PM   Specimen: BLOOD  Result Value Ref Range Status   Specimen Description   Final    BLOOD PORTA CATH Performed at [facility] [address] [place], [place] [address]    Special Requests   Final    BOTTLES DRAWN AEROBIC AND ANAEROBIC Blood Culture adequate volume Performed at [facility] [address] [place], [place] [address]    Culture  Setup Time   Final    GRAM NEGATIVE RODS AEROBIC BOTTLE ONLY CRITICAL RESULT CALLED TO, READ BACK BY AND VERIFIED WITH: PHARMD J LEGGE [id number] AT 1158 AM BY CM Performed at [facility], [place] [address] [place], [place] [address]    Culture GRAM NEGATIVE RODS  Final   Report Status PENDING  Incomplete  SARS CORONAVIRUS 2 Nasal Swab Aptima Multi Swab     Status: None   Collection Time: 05/04/19  5:29 PM   Specimen: Aptima Multi Swab; Nasal Swab  Result Value Ref Range Status   SARS Coronavirus 2 NEGATIVE NEGATIVE Final    Comment: (NOTE) SARS-CoV-2 target nucleic acids are NOT DETECTED. The SARS-CoV-2 RNA is generally detectable in upper and lower respiratory specimens during the acute phase of infection. Negative results do not preclude SARS-CoV-2 infection, do not rule out co-infections with other pathogens, and should not be used as  the sole basis for treatment or other patient management decisions. Negative results must be combined with clinical observations, patient history, and epidemiological information. The expected result is Negative. Fact Sheet for Patients: SugarRoll.be Fact Sheet for Healthcare Providers: https://www.woods-mathews.com/ This test is not yet approved or cleared by the [place] FDA and  has been authorized for detection and/or diagnosis of SARS-CoV-2 by FDA under an Emergency Use Authorization (EUA). This EUA will remain  in effect (meaning this test can be used) for the duration of the COVID-19 declaration under Section 56 4(b)(1) of the Act, 21 U.S.C. section 360bbb-3(b)(1), unless the authorization is terminated or revoked sooner. Performed at [facility], [place] [address] [place],  [address]   Blood Culture ID Panel (Reflexed)     Status: Abnormal   Collection Time: 05/04/19  5:29 PM  Result Value Ref Range Status   Enterococcus species NOT DETECTED NOT DETECTED Final   Listeria monocytogenes NOT DETECTED NOT DETECTED Final   Staphylococcus species NOT DETECTED NOT DETECTED Final   Staphylococcus aureus (BCID) NOT DETECTED NOT DETECTED Final   Streptococcus species NOT DETECTED NOT DETECTED Final   Streptococcus agalactiae NOT DETECTED NOT DETECTED Final   Streptococcus pneumoniae NOT DETECTED NOT DETECTED Final   Streptococcus pyogenes NOT DETECTED NOT DETECTED Final   Acinetobacter baumannii NOT DETECTED NOT DETECTED Final   Enterobacteriaceae species DETECTED (A) NOT DETECTED Final    Comment: Enterobacteriaceae represent a large family of gram-negative bacteria, not a single organism. CRITICAL RESULT CALLED TO, READ BACK BY AND VERIFIED WITH: PHARMD J LEGGE [id number] AT 1158 AM BY CM    Enterobacter cloacae complex NOT DETECTED NOT DETECTED Final   Escherichia coli DETECTED (A) NOT DETECTED Final    Comment: CRITICAL  RESULT CALLED TO, READ BACK BY AND VERIFIED WITH: PHARMD J LEGGE [id number] AT 1158 AM BY CM    Klebsiella oxytoca NOT DETECTED NOT DETECTED Final   Klebsiella pneumoniae NOT DETECTED NOT DETECTED Final   Proteus  species NOT DETECTED NOT DETECTED Final   Serratia marcescens NOT DETECTED NOT DETECTED Final   Carbapenem resistance NOT DETECTED NOT DETECTED Final   Haemophilus influenzae NOT DETECTED NOT DETECTED Final   Neisseria meningitidis NOT DETECTED NOT DETECTED Final   Pseudomonas aeruginosa NOT DETECTED NOT DETECTED Final   Candida albicans NOT DETECTED NOT DETECTED Final   Candida glabrata NOT DETECTED NOT DETECTED Final   Candida krusei NOT DETECTED NOT DETECTED Final   Candida parapsilosis NOT DETECTED NOT DETECTED Final   Candida tropicalis NOT DETECTED NOT DETECTED Final    Comment: Performed at [facility], [place] [address] [place], [place] [address]  Blood Culture (routine x 2)     Status: None (Preliminary result)   Collection Time: 05/04/19  5:47 PM   Specimen: BLOOD  Result Value Ref Range Status   Specimen Description   Final    BLOOD RIGHT WRIST Performed at [facility] [address], [place], [place] [address]    Special Requests   Final    BOTTLES DRAWN AEROBIC AND ANAEROBIC Blood Culture adequate volume Performed at [facility] [address], [place],  [address]    Culture   Final    NO GROWTH < 12 HOURS Performed at [facility] [address], [place],  [address]    Report Status PENDING  Incomplete    Radiology Reports Ct Abdomen Pelvis W Contrast  Addendum Date: 05/04/2019   ADDENDUM REPORT: 05/04/2019 19:43 ADDENDUM: The appearance of the LEFT LATERAL abdominal wall abscess is stable compared with most recent comparison, 05/02/2019. The salient findings were discussed with Lacretia Leigh on 05/04/2019 at 7:43 pm. Electronically Signed   By: Nolon Nations M.D.   On: 05/04/2019 19:43    Result Date: 05/04/2019 CLINICAL DATA:  Patient states he was called by his physician to come to the ED for an abscess in his colon. Patient has been having a low grade fever at home patient denies any pain at this time. Follow CT scan from 8/19. Per patient he has back pain. Patient's history of lymphoma and bowel obstruction. EXAM: CT ABDOMEN AND PELVIS WITH CONTRAST TECHNIQUE: Multidetector CT imaging of the abdomen and pelvis was performed using the standard protocol following bolus administration of intravenous contrast. CONTRAST:  138m OMNIPAQUE IOHEXOL 300 MG/ML  SOLN COMPARISON:  02/27/2019, 05/02/2019, and multiple prior studies FINDINGS: Lower chest: Subsegmental atelectasis at the lung bases. Hepatobiliary: Small cysts are identified. Gallbladder is present. Note is made of a Phrygian cap. No evidence for acute cholecystitis. Pancreas: Unremarkable. No pancreatic ductal dilatation or surrounding inflammatory changes. Spleen: Normal in size without focal abnormality. Adrenals/Urinary Tract: Normal adrenal glands. Small bilateral renal cysts. Intrarenal calculus in the RIGHT kidney measures 7 millimeters. Punctate calcification is identified in the UPPER pole of the LEFT kidney, 2 millimeters in diameter. No solid renal mass or hydronephrosis. The ureters are unremarkable. Stomach/Bowel: Stomach is unremarkable. RIGHT LOWER QUADRANT loop ileostomy is nonobstructing. There continues to be significant tethering and inflammatory change in the central pelvis and LEFT LOWER QUADRANT, extending to the LEFT pelvic sidewall. The cecum and sigmoid colon appear to be adherent. There are coarse calcifications in the region of the soft tissue between the cecum and sigmoid colon. There is persistent tethering of the descending colon with the LEFT LATERAL abdominal wall. There is persistent focal low-attenuation within the LEFT LATERAL peritoneal reflection, consistent with abscess, now measuring 2.7 x 1.7  centimeters. This is  best seen on image 35 of series 7. There is associated subcutaneous edema overlying the LEFT LATERAL abdominal wall. Vascular/Lymphatic: There is atherosclerotic calcification of the abdominal aorta. No aneurysm. No retroperitoneal or mesenteric adenopathy. Reproductive: Prostate is unremarkable. Other: None Musculoskeletal: Degenerative changes are seen in the LOWER thoracic and lumbar spine. No suspicious lytic or blastic lesions are identified. IMPRESSION: 1. Persistent tethering of the descending colon with the LEFT LATERAL abdominal wall, associated with abscess within the LEFT LATERAL peritoneal reflection measuring 2.7 x 1.7 centimeters. 2. No evidence for bowel obstruction. 3. Bilateral nonobstructing intrarenal calculi. 4. Hepatic and renal cysts. 5.  Aortic atherosclerosis.  (ICD10-I70.0) 6. Degenerative changes in the LOWER thoracic and lumbar spine. Electronically Signed: By: Nolon Nations M.D. On: 05/04/2019 19:31   Ct Abdomen Pelvis W Contrast  Result Date: 05/02/2019 CLINICAL DATA:  Abdominal pain, fever abscess suspected. Pancolitis. Lymphoma. EXAM: CT ABDOMEN AND PELVIS WITH CONTRAST TECHNIQUE: Multidetector CT imaging of the abdomen and pelvis was performed using the standard protocol following bolus administration of intravenous contrast. CONTRAST:  173m OMNIPAQUE IOHEXOL 300 MG/ML  SOLN COMPARISON:  02/27/2019. FINDINGS: Lower chest: Scattered scarring in the lung bases. Heart size normal. No pericardial or pleural effusion. Hepatobiliary: Millimetric low-attenuation lesions in the right hepatic lobe are unchanged and too small to characterize. Liver and gallbladder are otherwise unremarkable. No biliary ductal dilatation. Pancreas: Negative. Spleen: Negative. Adrenals/Urinary Tract: Adrenal glands are unremarkable. Low-attenuation lesions in the kidneys measure up to 1.1 cm on the right, as before. But too small to characterize, statistically, cysts are likely. A  stone is seen in the right kidney. Ureters are decompressed. A 4 mm stone is seen in the dependent aspect of the bladder, at the left ureteral orifice. Bladder is otherwise low in volume. Stomach/Bowel: Stomach and majority of the small bowel is unremarkable. Right lower quadrant ileostomy. There is wall thickening and pericolonic inflammatory stranding involving the distal descending and sigmoid colon, similar to the prior exam. The amount of pericolonic fluid appears subjectively decreased from 02/27/2019 but is difficult to measure. Soft tissue thickening extends to the left lateral abdominal wall, where there is a 1.1 cm peripherally hyperattenuating fluid collection (2/46), new. Probable small adjacent mesenteric organized fluid collection measuring 1.8 x 2.8 cm (2/58), likely similar. Adjacent cecum and small bowel loops appear tethered to the area of inflammation in the sigmoid colon. Appendix is not readily visualized. Vascular/Lymphatic: Atherosclerotic calcification of the aorta without aneurysm. No pathologically enlarged lymph nodes. Reproductive: Prostate is visualized. Other: Minimal presacral edema. Small amount of fluid is seen adjacent to cecum. Musculoskeletal: Degenerative changes in the spine. No worrisome lytic or sclerotic lesions. IMPRESSION: 1. Persistent colitis involving the descending/sigmoid colon with decrease in the amount of pericolonic fluid. A small organized fluid collection in the adjacent mesentery appears stable. A second adjacent tiny organized fluid collection along the left lateral abdominal wall appears new. Cecum and adjacent bowel loops appear tethered to the inflamed sigmoid colon. 2. Right renal stone.  Tiny left bladder stone/calcification. 3.  Aortic atherosclerosis (ICD10-170.0). Electronically Signed   By: MLorin PicketM.D.   On: 05/02/2019 13:13   Dg Chest Port 1 View  Result Date: 05/04/2019 CLINICAL DATA:  Cough, low-grade fever, diabetes mellitus, history  lymphoma EXAM: PORTABLE CHEST 1 VIEW COMPARISON:  Portable exam 1820 hours compared to 01/01/2019 FINDINGS: RIGHT jugular Port-A-Cath with tip projecting over SVC. Mediastinal contours and pulmonary vascularity normal. Bibasilar scarring unchanged. No acute infiltrate, pleural effusion or pneumothorax. No acute  osseous findings. IMPRESSION: Bibasilar scarring. No acute abnormalities. Electronically Signed   By: Lavonia Dana M.D.   On: 05/04/2019 18:48     CBC Recent Labs  Lab 05/04/19 1617 05/05/19 0500  WBC 0.6* 0.8*  HGB 8.1* 6.8*  HCT 25.8* 22.3*  PLT 157 138*  MCV 104.5* 107.7*  MCH 32.8 32.9  MCHC 31.4 30.5  RDW 16.3* 16.3*   Chemistries  Recent Labs  Lab 05/04/19 1617 05/05/19 0500  NA 134* 135  K 3.4* 3.1*  CL 98 102  CO2 25 24  GLUCOSE 236* 110*  BUN 10 9  CREATININE 0.52* 0.39*  CALCIUM 9.1 8.4*  AST 25 17  ALT 58* 45*  ALKPHOS 96 74  BILITOT 0.5 0.6   ----------------------------------------------------------------------------------------------------------------- No results for input(s): CHOL, HDL, LDLCALC, TRIG, CHOLHDL, LDLDIRECT in the last 72 hours.  Lab Results  Component Value Date   HGBA1C 6.0 (H) 01/25/2019   ---------------------------------------------------------------------------------------------------------------- No results for input(s): TSH, T4TOTAL, T3FREE, THYROIDAB in the last 72 hours.  Invalid input(s): FREET3 ------------------------------------------------------------------------------------------------------------------ No results for input(s): VITAMINB12, FOLATE, FERRITIN, TIBC, IRON, RETICCTPCT in the last 72 hours.  Coagulation profile No results for input(s): INR, PROTIME in the last 168 hours.  No results for input(s): DDIMER in the last 72 hours.  Cardiac Enzymes No results for input(s): CKMB, TROPONINI, MYOGLOBIN in the last 168 hours.  Invalid input(s):  CK ------------------------------------------------------------------------------------------------------------------ No results found for: BNP   Roxan Hockey M.D on 05/05/2019 at 6:50 PM  Go to [email or web address] - for contact info  [facility] - Office  [phone]

## 2019-05-05 NOTE — Progress Notes (Signed)
Mark Frey   DOB:[birth year]   QB#:[id number]   IOX#:[id number]  Subjective:  Met with pt in his hospital room; states he feels fine; tells me the chemotherapy he received last year damaged his bowel; was admitted with fever and abd discomfort but those symptoms have resolved. Denies change in bowel habits, BRBPR. Preliminary on 1/2 blood cultures = e coli, Sn pending.   Reason for consult: severe leukopenia Pt's most recent CBC on 03/22/2019 showed WBC 5.9, neuts 4.8, lymphs <0.5. Hb was 10.3. CBC 08/21 showed WBC 0.6, no diff;  Hb 8.1; platelets 157. Repeat WBC today 0.8  Objective: middle aged White man examined in bed Vitals:   05/05/19 1538 05/05/19 1540  BP: 102/71 102/71  Pulse: 73 73  Resp: 18 18  Temp: 97.7 F (36.5 C) 97.7 F (36.5 C)  SpO2: 98% 98%    There is no height or weight on file to calculate BMI.  Intake/Output Summary (Last 24 hours) at 05/05/2019 1604 Last data filed at 05/05/2019 1420 Gross per 24 hour  Intake 1543.36 ml  Output 1275 ml  Net 268.36 ml     Sclerae unicteric  No cervical or supraclavicular adenopathy  Lungs no rales or wheezes--auscultated anterolaterally  Heart regular rate and rhythm  Abdomen soft, +BS  Neuro nonfocal, distrustful affect    CBG (last 3)  Recent Labs    05/04/19 2152 05/05/19 0722 05/05/19 1131  GLUCAP 135* 104* 99     Labs:  Lab Results  Component Value Date   WBC 0.8 (LL) 05/05/2019   HGB 6.8 (LL) 05/05/2019   HCT 22.3 (L) 05/05/2019   MCV 107.7 (H) 05/05/2019   PLT 138 (L) 05/05/2019   NEUTROABS 4.8 03/22/2019    _0 @  Urine Studies No results for input(s): UHGB, CRYS in the last 72 hours.  Invalid input(s): UACOL, UAPR, USPG, UPH, UTP, UGL, UKET, UBIL, UNIT, UROB, ULEU, UEPI, UWBC, URBC, UBAC, CAST, UCOM, BILUA  Basic Metabolic Panel: Recent Labs  Lab 05/04/19 1617 05/05/19 0500  NA 134* 135  K 3.4* 3.1*  CL 98 102  CO2 25 24  GLUCOSE 236* 110*  BUN 10 9  CREATININE 0.52*  0.39*  CALCIUM 9.1 8.4*   GFR Estimated Creatinine Clearance: 95.7 mL/min (A) (by C-G formula based on SCr of 0.39 mg/dL (L)). Liver Function Tests: Recent Labs  Lab 05/04/19 1617 05/05/19 0500  AST 25 17  ALT 58* 45*  ALKPHOS 96 74  BILITOT 0.5 0.6  PROT 6.6 5.9*  ALBUMIN 2.8* 2.5*   Recent Labs  Lab 05/04/19 1617  LIPASE 21   No results for input(s): AMMONIA in the last 168 hours. Coagulation profile No results for input(s): INR, PROTIME in the last 168 hours.  CBC: Recent Labs  Lab 05/04/19 1617 05/05/19 0500  WBC 0.6* 0.8*  HGB 8.1* 6.8*  HCT 25.8* 22.3*  MCV 104.5* 107.7*  PLT 157 138*   Cardiac Enzymes: No results for input(s): CKTOTAL, CKMB, CKMBINDEX, TROPONINI in the last 168 hours. BNP: Invalid input(s): POCBNP CBG: Recent Labs  Lab 05/04/19 2152 05/05/19 0722 05/05/19 1131  GLUCAP 135* 104* 99   D-Dimer No results for input(s): DDIMER in the last 72 hours. Hgb A1c No results for input(s): HGBA1C in the last 72 hours. Lipid Profile No results for input(s): CHOL, HDL, LDLCALC, TRIG, CHOLHDL, LDLDIRECT in the last 72 hours. Thyroid function studies No results for input(s): TSH, T4TOTAL, T3FREE, THYROIDAB in the last 72 hours.  Invalid input(s): FREET3 Anemia work  up No results for input(s): VITAMINB12, FOLATE, FERRITIN, TIBC, IRON, RETICCTPCT in the last 72 hours. Microbiology Recent Results (from the past 240 hour(s))  Blood Culture (routine x 2)     Status: None (Preliminary result)   Collection Time: 05/04/19  5:29 PM   Specimen: BLOOD  Result Value Ref Range Status   Specimen Description   Final    BLOOD PORTA CATH Performed at [facility] [address] [place], [place] [address]    Special Requests   Final    BOTTLES DRAWN AEROBIC AND ANAEROBIC Blood Culture adequate volume Performed at [facility] [address], [place], [place] [address]    Culture  Setup Time   Final    GRAM  NEGATIVE RODS AEROBIC BOTTLE ONLY CRITICAL RESULT CALLED TO, READ BACK BY AND VERIFIED WITH: PHARMD J LEGGE [id number] AT 1158 AM BY CM Performed at [facility], [place] [address], [place], [place] [address]    Culture GRAM NEGATIVE RODS  Final   Report Status PENDING  Incomplete  SARS CORONAVIRUS 2 Nasal Swab Aptima Multi Swab     Status: None   Collection Time: 05/04/19  5:29 PM   Specimen: Aptima Multi Swab; Nasal Swab  Result Value Ref Range Status   SARS Coronavirus 2 NEGATIVE NEGATIVE Final    Comment: (NOTE) SARS-CoV-2 target nucleic acids are NOT DETECTED. The SARS-CoV-2 RNA is generally detectable in upper and lower respiratory specimens during the acute phase of infection. Negative results do not preclude SARS-CoV-2 infection, do not rule out co-infections with other pathogens, and should not be used as the sole basis for treatment or other patient management decisions. Negative results must be combined with clinical observations, patient history, and epidemiological information. The expected result is Negative. Fact Sheet for Patients: SugarRoll.be Fact Sheet for Healthcare Providers: https://www.woods-mathews.com/ This test is not yet approved or cleared by the [place] FDA and  has been authorized for detection and/or diagnosis of SARS-CoV-2 by FDA under an Emergency Use Authorization (EUA). This EUA will remain  in effect (meaning this test can be used) for the duration of the COVID-19 declaration under Section 56 4(b)(1) of the Act, 21 U.S.C. section 360bbb-3(b)(1), unless the authorization is terminated or revoked sooner. Performed at [facility], [place] [address], [place], [place] [address]   Blood Culture ID Panel (Reflexed)     Status: Abnormal   Collection Time: 05/04/19  5:29 PM  Result Value Ref Range Status   Enterococcus species NOT DETECTED NOT DETECTED Final   Listeria monocytogenes NOT DETECTED NOT  DETECTED Final   Staphylococcus species NOT DETECTED NOT DETECTED Final   Staphylococcus aureus (BCID) NOT DETECTED NOT DETECTED Final   Streptococcus species NOT DETECTED NOT DETECTED Final   Streptococcus agalactiae NOT DETECTED NOT DETECTED Final   Streptococcus pneumoniae NOT DETECTED NOT DETECTED Final   Streptococcus pyogenes NOT DETECTED NOT DETECTED Final   Acinetobacter baumannii NOT DETECTED NOT DETECTED Final   Enterobacteriaceae species DETECTED (A) NOT DETECTED Final    Comment: Enterobacteriaceae represent a large family of gram-negative bacteria, not a single organism. CRITICAL RESULT CALLED TO, READ BACK BY AND VERIFIED WITH: PHARMD J LEGGE [id number] AT 1158 AM BY CM    Enterobacter cloacae complex NOT DETECTED NOT DETECTED Final   Escherichia coli DETECTED (A) NOT DETECTED Final    Comment: CRITICAL RESULT CALLED TO, READ BACK BY AND VERIFIED WITH: PHARMD J LEGGE [id number] AT 1158 AM BY CM  Klebsiella oxytoca NOT DETECTED NOT DETECTED Final   Klebsiella pneumoniae NOT DETECTED NOT DETECTED Final   Proteus species NOT DETECTED NOT DETECTED Final   Serratia marcescens NOT DETECTED NOT DETECTED Final   Carbapenem resistance NOT DETECTED NOT DETECTED Final   Haemophilus influenzae NOT DETECTED NOT DETECTED Final   Neisseria meningitidis NOT DETECTED NOT DETECTED Final   Pseudomonas aeruginosa NOT DETECTED NOT DETECTED Final   Candida albicans NOT DETECTED NOT DETECTED Final   Candida glabrata NOT DETECTED NOT DETECTED Final   Candida krusei NOT DETECTED NOT DETECTED Final   Candida parapsilosis NOT DETECTED NOT DETECTED Final   Candida tropicalis NOT DETECTED NOT DETECTED Final    Comment: Performed at [facility], [place] [address], [place], [place] [address]  Blood Culture (routine x 2)     Status: None (Preliminary result)   Collection Time: 05/04/19  5:47 PM   Specimen: BLOOD  Result Value Ref Range Status   Specimen Description   Final    BLOOD RIGHT  WRIST Performed at [facility] [address], [place], [place] [address]    Special Requests   Final    BOTTLES DRAWN AEROBIC AND ANAEROBIC Blood Culture adequate volume Performed at [facility] [address], [place], [place] [address]    Culture   Final    NO GROWTH < 12 HOURS Performed at [facility] [address], [place], [place] [address]    Report Status PENDING  Incomplete      Studies:  Ct Abdomen Pelvis W Contrast  Addendum Date: 05/04/2019   ADDENDUM REPORT: 05/04/2019 19:43 ADDENDUM: The appearance of the LEFT LATERAL abdominal wall abscess is stable compared with most recent comparison, 05/02/2019. The salient findings were discussed with Lacretia Leigh on 05/04/2019 at 7:43 pm. Electronically Signed   By: Nolon Nations M.D.   On: 05/04/2019 19:43   Result Date: 05/04/2019 CLINICAL DATA:  Patient states he was called by his physician to come to the ED for an abscess in his colon. Patient has been having a low grade fever at home patient denies any pain at this time. Follow CT scan from 8/19. Per patient he has back pain. Patient's history of lymphoma and bowel obstruction. EXAM: CT ABDOMEN AND PELVIS WITH CONTRAST TECHNIQUE: Multidetector CT imaging of the abdomen and pelvis was performed using the standard protocol following bolus administration of intravenous contrast. CONTRAST:  146m OMNIPAQUE IOHEXOL 300 MG/ML  SOLN COMPARISON:  02/27/2019, 05/02/2019, and multiple prior studies FINDINGS: Lower chest: Subsegmental atelectasis at the lung bases. Hepatobiliary: Small cysts are identified. Gallbladder is present. Note is made of a Phrygian cap. No evidence for acute cholecystitis. Pancreas: Unremarkable. No pancreatic ductal dilatation or surrounding inflammatory changes. Spleen: Normal in size without focal abnormality. Adrenals/Urinary Tract: Normal adrenal glands. Small bilateral renal cysts. Intrarenal calculus in the RIGHT  kidney measures 7 millimeters. Punctate calcification is identified in the UPPER pole of the LEFT kidney, 2 millimeters in diameter. No solid renal mass or hydronephrosis. The ureters are unremarkable. Stomach/Bowel: Stomach is unremarkable. RIGHT LOWER QUADRANT loop ileostomy is nonobstructing. There continues to be significant tethering and inflammatory change in the central pelvis and LEFT LOWER QUADRANT, extending to the LEFT pelvic sidewall. The cecum and sigmoid colon appear to be adherent. There are coarse calcifications in the region of the soft tissue between the cecum and sigmoid colon. There is persistent tethering of the descending colon with the LEFT LATERAL abdominal wall. There  is persistent focal low-attenuation within the LEFT LATERAL peritoneal reflection, consistent with abscess, now measuring 2.7 x 1.7 centimeters. This is best seen on image 35 of series 7. There is associated subcutaneous edema overlying the LEFT LATERAL abdominal wall. Vascular/Lymphatic: There is atherosclerotic calcification of the abdominal aorta. No aneurysm. No retroperitoneal or mesenteric adenopathy. Reproductive: Prostate is unremarkable. Other: None Musculoskeletal: Degenerative changes are seen in the LOWER thoracic and lumbar spine. No suspicious lytic or blastic lesions are identified. IMPRESSION: 1. Persistent tethering of the descending colon with the LEFT LATERAL abdominal wall, associated with abscess within the LEFT LATERAL peritoneal reflection measuring 2.7 x 1.7 centimeters. 2. No evidence for bowel obstruction. 3. Bilateral nonobstructing intrarenal calculi. 4. Hepatic and renal cysts. 5.  Aortic atherosclerosis.  (ICD10-I70.0) 6. Degenerative changes in the LOWER thoracic and lumbar spine. Electronically Signed: By: Nolon Nations M.D. On: 05/04/2019 19:31   Dg Chest Port 1 View  Result Date: 05/04/2019 CLINICAL DATA:  Cough, low-grade fever, diabetes mellitus, history lymphoma EXAM: PORTABLE CHEST 1  VIEW COMPARISON:  Portable exam 1820 hours compared to 01/01/2019 FINDINGS: RIGHT jugular Port-A-Cath with tip projecting over SVC. Mediastinal contours and pulmonary vascularity normal. Bibasilar scarring unchanged. No acute infiltrate, pleural effusion or pneumothorax. No acute osseous findings. IMPRESSION: Bibasilar scarring. No acute abnormalities. Electronically Signed   By: Lavonia Dana M.D.   On: 05/04/2019 18:48    Assessment: 63 y.o. 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  (b) biopsies of ileum and colon 03/01/2019 show no granulomas or lymphoma  (3) admitted with e coli sepsis 05/04/2019  (a) on cefepime, started 05/04/2019  (b) final culture and Sn pending  (4) severe leukopenia  REVIEW OF BLOOD FILM shows no tailed poikylocytes or NRBCs and no significant anisocytosis; occ giant platelet and some platelet clumps; no polys or bands; rare monocytes and mature lymphs, slightly more frequent atypical lymphs w/o granular cytoplasm-- no vacuolation  (5) anemia requiring transfusion  (a) anemia workup in process    Plan:  I explained to Mr Age that he has gram-negative sepsis, is receiving IV ABX, and final cultures and sensitivities are pending. However he is clinically improved, with resolution of the fever and abd discomfort.  We discussed the leukopenia. This likely will resolve as well with treatment of the blood infection but we can perhaps accelerate recovery and discharge with neupogen which I will write.  I will let Dr [name] know of patient's admission.  Will follow with you   Chauncey Cruel, MD 05/05/2019  4:04 PM Medical Oncology and Hematology [facility] [address] [place], [place] [address] Tel. [phone]    Fax. [phone]

## 2019-05-05 NOTE — Progress Notes (Signed)
RN took consent form for pt to look over and sign. Pt states he will not sign anything until his wife gets here at 26, so they will make the decision together.

## 2019-05-06 LAB — GLUCOSE, CAPILLARY
Glucose-Capillary: 82 mg/dL (ref 70–99)
Glucose-Capillary: 79 mg/dL (ref 70–99)
Glucose-Capillary: 135 mg/dL — ABNORMAL HIGH (ref 70–99)
Glucose-Capillary: 103 mg/dL — ABNORMAL HIGH (ref 70–99)

## 2019-05-06 LAB — CBC WITH DIFFERENTIAL/PLATELET
HCT: 28.7 % — ABNORMAL LOW (ref 39.0–52.0)
Hemoglobin: 9.1 g/dL — ABNORMAL LOW (ref 13.0–17.0)
MCH: 32.9 pg (ref 26.0–34.0)
MCHC: 31.7 g/dL (ref 30.0–36.0)
MCV: 103.6 fL — ABNORMAL HIGH (ref 80.0–100.0)
Platelets: 136 10*3/uL — ABNORMAL LOW (ref 150–400)
RBC: 2.77 MIL/uL — ABNORMAL LOW (ref 4.22–5.81)
RDW: 17.5 % — ABNORMAL HIGH (ref 11.5–15.5)
WBC: 0.8 10*3/uL — CL (ref 4.0–10.5)
nRBC: 0 % (ref 0.0–0.2)

## 2019-05-06 LAB — OCCULT BLOOD X 1 CARD TO LAB, STOOL: Fecal Occult Bld: NEGATIVE

## 2019-05-06 MED ORDER — APIXABAN 5 MG PO TABS
5.0000 mg | ORAL_TABLET | Freq: Two times a day (BID) | ORAL | Status: DC
  Administered 2019-05-06 – 2019-05-10 (×8): 5 mg via ORAL
  Filled 2019-05-06 (×8): qty 1

## 2019-05-06 NOTE — Progress Notes (Signed)
Patient Demographics:    Mark Frey, is a 63 y.o. male, DOB - 1956-09-12, PQZ:[id number]  Admit date - 05/04/2019   Admitting Physician Elwyn Reach, MD  Outpatient Primary MD for the patient is [name], MD  LOS - 2   Chief Complaint  Patient presents with   Abscess   Fever        Subjective:    [name] today has  no emesis,  No chest pain, no fevers and tolerating clear liquid diet, wanting to eat more  Assessment  & Plan :    Principal Problem:   Intra-abdominal abscess ([place]) Active Problems:   Hyperlipidemia   Irritable bowel syndrome   Controlled diabetes mellitus type II without complication (HCC)   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HCC)   Pancytopenia (HCC)   Neutropenia (HCC)   Depression, major, single episode, complete remission [facility])  Brief Summary -63 year old male status post prior chemoradiation treatment for Burkitt's lymphoma stage II ED initially diagnosed in July 2019, as well as history of DM, HLD and HTN admitted on 05/04/2019 with concerns about intra-abdominal abscess with blood cultures positive for E. coli  A/P 1) E. coli Bacteremia and Sepsis--blood cultures from 05/04/2019 with 1 out of 2 bottles with E. coli, continue cefepime 2 g IV every 8 hours ,consider de-escalating when sensitivity available -Patient met sepsis criteria on admission, he had fevers at home, he was tachycardic up to 115, he had severe leukopenia, blood pressures were soft with  blood pressure of 93/58  2) intra-abdominal abscess--CT abdomen and pelvis with 2.7 x 1.7 cm abscess, continue cefepime and Flagyl as ordered pending sensitivity of E. coli in blood cultures... If fails to improve consider repeat CT abdomen and pelvis and possible IR consult for drainage -Discussed with Dr. Barry Dienes from general surgery, no surgical intervention recommended at this time  3) leukopenia --- WBC  is 0.8 from 0.6 on admission,   suspect severe leukopenia due to underlying E. coli bacteremia/sepsis and intra-abdominal abscess -Discussed with Dr. Jana Hakim from hematology service,  -c/n Granix for leukopenia -Please note that at baseline patient does have some degree of pancytopenia -Peripheral smear review requested  4) anemia--hemoglobin is up to 9.1 from 6.8 , after transfusion of 2 units of packed cells - patient baseline hemoglobin usually around 8 -Hematology consult appreciated -Please note that at baseline patient does have some degree of pancytopenia -Anemia work-up -reveals F35 and folic acid to be WNL , Serum iron is not low, ferritin is not low, stool occult blood is NEG -LDH is 57  (not elevated ) and [place] panel Neg  5)H/o  Burkitt's lymphoma, stage IIE,initially diagnosed July 2019             (a) s/p R-EPOCH x 6, last dose 07/06/2018             (b) restaging PET 07/29/2019 shows good response but pericolonic inflammation --Usually follows with [name]  6)Hx DVT/PE at presentation June 2019- Eliquis was on hold for 24  hours, due to  worsening anemia and worsening thrombocytopenia -Stool occult blood is negative -May restart apixaban 5 mg twice daily p.m. of 05/06/2019  (7)Persistent colitis with recurrent SBO             (a) s/p diverting loop ileostomy 09/11/2018             (b) biopsies of ileum and colon 03/01/2019 showed no granulomas or lymphoma  8)DM2-A1c was 6.0 in May 2020,, Use Novolog/Humalog Sliding scale insulin with Accu-Cheks/Fingersticks as ordered  Disposition/Need for in-Hospital Stay- patient unable to be discharged at this time due to E. coli sepsis and bacteremia with intra-abdominal abscess requiring IV antibiotics*  Code Status : full  Family Communication:   NA (patient is alert, awake and coherent)  Disposition Plan  : TBD  Consults  :  Hematology/phone consult with general surgeon Dr. Barry Dienes  DVT Prophylaxis  : Eliquis on hold due  to worsening anemia and worsening thrombocytopenia- SCDs   Lab Results  Component Value Date   PLT 136 (L) 05/06/2019   Inpatient Medications  Scheduled Meds:  apixaban  5 mg Oral BID   citalopram  20 mg Oral Daily   dexamethasone  1 mg Oral q morning - 10a   dronabinol  5 mg Oral Daily   insulin aspart  0-5 Units Subcutaneous QHS   insulin aspart  0-9 Units Subcutaneous TID WC   magnesium oxide  400 mg Oral BID   Tbo-Filgrastim  480 mcg Subcutaneous 1 day or 1 dose   zinc sulfate  220 mg Oral BID   Continuous Infusions:  sodium chloride 1,000 mL (05/04/19 1806)   sodium chloride 100 mL/hr at 05/06/19 0654   ceFEPime (MAXIPIME) IV 2 g (05/06/19 0154)   metronidazole 500 mg (05/06/19 0244)   PRN Meds:.acetaminophen, HYDROcodone-acetaminophen, ondansetron **OR** ondansetron (ZOFRAN) IV    Anti-infectives (From admission, onward)   Start     Dose/Rate Route Frequency Ordered Stop   05/05/19 0200  metroNIDAZOLE (FLAGYL) IVPB 500 mg     500 mg 100 mL/hr over 60 Minutes Intravenous Every 8 hours 05/04/19 2109     05/05/19 0200  ceFEPIme (MAXIPIME) 2 g in sodium chloride 0.9 % 100 mL IVPB     2 g 200 mL/hr over 30 Minutes Intravenous Every 8 hours 05/04/19 2108     05/04/19 1730  ceFEPIme (MAXIPIME) 2 g in sodium chloride 0.9 % 100 mL IVPB     2 g 200 mL/hr over 30 Minutes Intravenous  Once 05/04/19 1716 05/04/19 1853   05/04/19 1730  metroNIDAZOLE (FLAGYL) IVPB 500 mg     500 mg 100 mL/hr over 60 Minutes Intravenous  Once 05/04/19 1716 05/04/19 1926   05/04/19 1730  vancomycin (VANCOCIN) IVPB 1000 mg/200 mL premix     1,000 mg 200 mL/hr over 60 Minutes Intravenous  Once 05/04/19 1716 05/04/19 1919       Objective:   Vitals:   05/05/19 1621 05/05/19 1845 05/05/19 2101 05/06/19 0535  BP: 93/63 95/66 98/68 95/73  Pulse: 68 67 69 (!) 52  Resp: _0 Temp: 97.7 F (36.5 C) 98 F (36.7 C) 97.9 F (36.6 C) 98.1 F (36.7 C)  TempSrc: Oral Oral Oral  Oral  SpO2: 98% 98% 98% 99%    Wt Readings from Last 3 Encounters:  05/04/19 73.9 kg  04/17/19 73.9 kg  03/22/19 74.2 kg    Intake/Output Summary (Last 24 hours) at 05/06/2019 1017 Last data filed at 05/06/2019  0900 Gross per 24 hour  Intake 5952.29 ml  Output 2500 ml  Net 3452.29 ml   Physical Exam  Gen:- Awake Alert,  In no apparent distress  HEENT:- [place].AT, No sclera icterus Neck-Supple Neck,No JVD,.  Lungs-  CTAB , fair symmetrical air movement CV- S1, S2 normal, regular , Rt sided Port-A-Cath in situ-site is clean dry and intact Abd-  +ve B.Sounds, Abd Soft, improved lower abdominal tenderness without rebound or guarding, colostomy bag with fecal material Extremity/Skin:- No  edema, pedal pulses present  Psych-affect is appropriate, oriented x3 Neuro-no new focal deficits, no tremors   Data Review:   Micro Results Recent Results (from the past 240 hour(s))  Blood Culture (routine x 2)     Status: Abnormal (Preliminary result)   Collection Time: 05/04/19  5:29 PM   Specimen: BLOOD  Result Value Ref Range Status   Specimen Description   Final    BLOOD PORTA CATH Performed at [facility] [address], [place], [place] [address]    Special Requests   Final    BOTTLES DRAWN AEROBIC AND ANAEROBIC Blood Culture adequate volume Performed at [facility] [address] [place], [place] [address]    Culture  Setup Time   Final    GRAM NEGATIVE RODS AEROBIC BOTTLE ONLY CRITICAL RESULT CALLED TO, READ BACK BY AND VERIFIED WITH: PHARMD J LEGGE [id number] AT 1158 AM BY CM Performed at [facility], [place] [address], [place], [place] [address]    Culture ESCHERICHIA COLI (A)  Final   Report Status PENDING  Incomplete  SARS CORONAVIRUS 2 Nasal Swab Aptima Multi Swab     Status: None   Collection Time: 05/04/19  5:29 PM   Specimen: Aptima Multi Swab; Nasal Swab  Result Value Ref Range Status   SARS Coronavirus 2 NEGATIVE NEGATIVE  Final    Comment: (NOTE) SARS-CoV-2 target nucleic acids are NOT DETECTED. The SARS-CoV-2 RNA is generally detectable in upper and lower respiratory specimens during the acute phase of infection. Negative results do not preclude SARS-CoV-2 infection, do not rule out co-infections with other pathogens, and should not be used as the sole basis for treatment or other patient management decisions. Negative results must be combined with clinical observations, patient history, and epidemiological information. The expected result is Negative. Fact Sheet for Patients: SugarRoll.be Fact Sheet for Healthcare Providers: https://www.woods-mathews.com/ This test is not yet approved or cleared by the [place] FDA and  has been authorized for detection and/or diagnosis of SARS-CoV-2 by FDA under an Emergency Use Authorization (EUA). This EUA will remain  in effect (meaning this test can be used) for the duration of the COVID-19 declaration under Section 56 4(b)(1) of the Act, 21 U.S.C. section 360bbb-3(b)(1), unless the authorization is terminated or revoked sooner. Performed at [facility], [place] [address] [place], [place] [address]   Blood Culture ID Panel (Reflexed)     Status: Abnormal   Collection Time: 05/04/19  5:29 PM  Result Value Ref Range Status   Enterococcus species NOT DETECTED NOT DETECTED Final   Listeria monocytogenes NOT DETECTED NOT DETECTED Final   Staphylococcus species NOT DETECTED NOT DETECTED Final   Staphylococcus aureus (BCID) NOT DETECTED NOT DETECTED Final   Streptococcus species NOT DETECTED NOT DETECTED Final   Streptococcus agalactiae NOT DETECTED NOT DETECTED Final   Streptococcus pneumoniae NOT DETECTED NOT DETECTED Final   Streptococcus pyogenes NOT DETECTED NOT DETECTED Final   Acinetobacter baumannii NOT  DETECTED NOT DETECTED Final   Enterobacteriaceae species DETECTED (A) NOT DETECTED Final    Comment:  Enterobacteriaceae represent a large family of gram-negative bacteria, not a single organism. CRITICAL RESULT CALLED TO, READ BACK BY AND VERIFIED WITH: PHARMD J LEGGE [id number] AT 1158 AM BY CM    Enterobacter cloacae complex NOT DETECTED NOT DETECTED Final   Escherichia coli DETECTED (A) NOT DETECTED Final    Comment: CRITICAL RESULT CALLED TO, READ BACK BY AND VERIFIED WITH: PHARMD J LEGGE [id number] AT 3845 AM BY CM    Klebsiella oxytoca NOT DETECTED NOT DETECTED Final   Klebsiella pneumoniae NOT DETECTED NOT DETECTED Final   Proteus species NOT DETECTED NOT DETECTED Final   Serratia marcescens NOT DETECTED NOT DETECTED Final   Carbapenem resistance NOT DETECTED NOT DETECTED Final   Haemophilus influenzae NOT DETECTED NOT DETECTED Final   Neisseria meningitidis NOT DETECTED NOT DETECTED Final   Pseudomonas aeruginosa NOT DETECTED NOT DETECTED Final   Candida albicans NOT DETECTED NOT DETECTED Final   Candida glabrata NOT DETECTED NOT DETECTED Final   Candida krusei NOT DETECTED NOT DETECTED Final   Candida parapsilosis NOT DETECTED NOT DETECTED Final   Candida tropicalis NOT DETECTED NOT DETECTED Final    Comment: Performed at [facility], [place]. [address] [place], [place] [address]  Blood Culture (routine x 2)     Status: None (Preliminary result)   Collection Time: 05/04/19  5:47 PM   Specimen: BLOOD  Result Value Ref Range Status   Specimen Description   Final    BLOOD RIGHT WRIST Performed at [facility] [address] [place], [place] [address]    Special Requests   Final    BOTTLES DRAWN AEROBIC AND ANAEROBIC Blood Culture adequate volume Performed at [facility] [address] [place], [place] [address]    Culture   Final    NO GROWTH 2 DAYS Performed at [facility] [address], [place], [place] [address]    Report Status PENDING  Incomplete    Radiology Reports Ct Abdomen Pelvis W Contrast  Addendum Date:  05/04/2019   ADDENDUM REPORT: 05/04/2019 19:43 ADDENDUM: The appearance of the LEFT LATERAL abdominal wall abscess is stable compared with most recent comparison, 05/02/2019. The salient findings were discussed with Lacretia Leigh on 05/04/2019 at 7:43 pm. Electronically Signed   By: Nolon Nations M.D.   On: 05/04/2019 19:43   Result Date: 05/04/2019 CLINICAL DATA:  Patient states he was called by his physician to come to the ED for an abscess in his colon. Patient has been having a low grade fever at home patient denies any pain at this time. Follow CT scan from 8/19. Per patient he has back pain. Patient's history of lymphoma and bowel obstruction. EXAM: CT ABDOMEN AND PELVIS WITH CONTRAST TECHNIQUE: Multidetector CT imaging of the abdomen and pelvis was performed using the standard protocol following bolus administration of intravenous contrast. CONTRAST:  189m OMNIPAQUE IOHEXOL 300 MG/ML  SOLN COMPARISON:  02/27/2019, 05/02/2019, and multiple prior studies FINDINGS: Lower chest: Subsegmental atelectasis at the lung bases. Hepatobiliary: Small cysts are identified. Gallbladder is present. Note is made of a Phrygian cap. No evidence for acute cholecystitis. Pancreas: Unremarkable. No pancreatic ductal dilatation or surrounding inflammatory changes. Spleen: Normal in size without focal abnormality. Adrenals/Urinary Tract: Normal adrenal glands. Small bilateral renal cysts. Intrarenal calculus in the RIGHT kidney measures 7 millimeters. Punctate calcification is identified in the UPPER pole of the  LEFT kidney, 2 millimeters in diameter. No solid renal mass or hydronephrosis. The ureters are unremarkable. Stomach/Bowel: Stomach is unremarkable. RIGHT LOWER QUADRANT loop ileostomy is nonobstructing. There continues to be significant tethering and inflammatory change in the central pelvis and LEFT LOWER QUADRANT, extending to the LEFT pelvic sidewall. The cecum and sigmoid colon appear to be adherent. There are  coarse calcifications in the region of the soft tissue between the cecum and sigmoid colon. There is persistent tethering of the descending colon with the LEFT LATERAL abdominal wall. There is persistent focal low-attenuation within the LEFT LATERAL peritoneal reflection, consistent with abscess, now measuring 2.7 x 1.7 centimeters. This is best seen on image 35 of series 7. There is associated subcutaneous edema overlying the LEFT LATERAL abdominal wall. Vascular/Lymphatic: There is atherosclerotic calcification of the abdominal aorta. No aneurysm. No retroperitoneal or mesenteric adenopathy. Reproductive: Prostate is unremarkable. Other: None Musculoskeletal: Degenerative changes are seen in the LOWER thoracic and lumbar spine. No suspicious lytic or blastic lesions are identified. IMPRESSION: 1. Persistent tethering of the descending colon with the LEFT LATERAL abdominal wall, associated with abscess within the LEFT LATERAL peritoneal reflection measuring 2.7 x 1.7 centimeters. 2. No evidence for bowel obstruction. 3. Bilateral nonobstructing intrarenal calculi. 4. Hepatic and renal cysts. 5.  Aortic atherosclerosis.  (ICD10-I70.0) 6. Degenerative changes in the LOWER thoracic and lumbar spine. Electronically Signed: By: Nolon Nations M.D. On: 05/04/2019 19:31   Ct Abdomen Pelvis W Contrast  Result Date: 05/02/2019 CLINICAL DATA:  Abdominal pain, fever abscess suspected. Pancolitis. Lymphoma. EXAM: CT ABDOMEN AND PELVIS WITH CONTRAST TECHNIQUE: Multidetector CT imaging of the abdomen and pelvis was performed using the standard protocol following bolus administration of intravenous contrast. CONTRAST:  134m OMNIPAQUE IOHEXOL 300 MG/ML  SOLN COMPARISON:  02/27/2019. FINDINGS: Lower chest: Scattered scarring in the lung bases. Heart size normal. No pericardial or pleural effusion. Hepatobiliary: Millimetric low-attenuation lesions in the right hepatic lobe are unchanged and too small to characterize. Liver  and gallbladder are otherwise unremarkable. No biliary ductal dilatation. Pancreas: Negative. Spleen: Negative. Adrenals/Urinary Tract: Adrenal glands are unremarkable. Low-attenuation lesions in the kidneys measure up to 1.1 cm on the right, as before. But too small to characterize, statistically, cysts are likely. A stone is seen in the right kidney. Ureters are decompressed. A 4 mm stone is seen in the dependent aspect of the bladder, at the left ureteral orifice. Bladder is otherwise low in volume. Stomach/Bowel: Stomach and majority of the small bowel is unremarkable. Right lower quadrant ileostomy. There is wall thickening and pericolonic inflammatory stranding involving the distal descending and sigmoid colon, similar to the prior exam. The amount of pericolonic fluid appears subjectively decreased from 02/27/2019 but is difficult to measure. Soft tissue thickening extends to the left lateral abdominal wall, where there is a 1.1 cm peripherally hyperattenuating fluid collection (2/46), new. Probable small adjacent mesenteric organized fluid collection measuring 1.8 x 2.8 cm (2/58), likely similar. Adjacent cecum and small bowel loops appear tethered to the area of inflammation in the sigmoid colon. Appendix is not readily visualized. Vascular/Lymphatic: Atherosclerotic calcification of the aorta without aneurysm. No pathologically enlarged lymph nodes. Reproductive: Prostate is visualized. Other: Minimal presacral edema. Small amount of fluid is seen adjacent to cecum. Musculoskeletal: Degenerative changes in the spine. No worrisome lytic or sclerotic lesions. IMPRESSION: 1. Persistent colitis involving the descending/sigmoid colon with decrease in the amount of pericolonic fluid. A small organized fluid collection in the adjacent mesentery appears stable. A second adjacent tiny organized  fluid collection along the left lateral abdominal wall appears new. Cecum and adjacent bowel loops appear tethered to the  inflamed sigmoid colon. 2. Right renal stone.  Tiny left bladder stone/calcification. 3.  Aortic atherosclerosis (ICD10-170.0). Electronically Signed   By: Lorin Picket M.D.   On: 05/02/2019 13:13   Dg Chest Port 1 View  Result Date: 05/04/2019 CLINICAL DATA:  Cough, low-grade fever, diabetes mellitus, history lymphoma EXAM: PORTABLE CHEST 1 VIEW COMPARISON:  Portable exam 1820 hours compared to 01/01/2019 FINDINGS: RIGHT jugular Port-A-Cath with tip projecting over SVC. Mediastinal contours and pulmonary vascularity normal. Bibasilar scarring unchanged. No acute infiltrate, pleural effusion or pneumothorax. No acute osseous findings. IMPRESSION: Bibasilar scarring. No acute abnormalities. Electronically Signed   By: Lavonia Dana M.D.   On: 05/04/2019 18:48     CBC Recent Labs  Lab 05/04/19 1617 05/05/19 0500 05/05/19 2128 05/06/19 0425  WBC 0.6* 0.8*  --  0.8*  HGB 8.1* 6.8* 8.8* 9.1*  HCT 25.8* 22.3* 28.1* 28.7*  PLT 157 138*  --  136*  MCV 104.5* 107.7*  --  103.6*  MCH 32.8 32.9  --  32.9  MCHC 31.4 30.5  --  31.7  RDW 16.3* 16.3*  --  17.5*   Chemistries  Recent Labs  Lab 05/04/19 1617 05/05/19 0500  NA 134* 135  K 3.4* 3.1*  CL 98 102  CO2 25 24  GLUCOSE 236* 110*  BUN 10 9  CREATININE 0.52* 0.39*  CALCIUM 9.1 8.4*  AST 25 17  ALT 58* 45*  ALKPHOS 96 74  BILITOT 0.5 0.6   ----------------------------------------------------------------------------------------------------------------- No results for input(s): CHOL, HDL, LDLCALC, TRIG, CHOLHDL, LDLDIRECT in the last 72 hours.  Lab Results  Component Value Date   HGBA1C 6.0 (H) 01/25/2019   ---------------------------------------------------------------------------------------------------------------- No results for input(s): TSH, T4TOTAL, T3FREE, THYROIDAB in the last 72 hours.  Invalid input(s):  FREET3 ------------------------------------------------------------------------------------------------------------------ Recent Labs    05/05/19 2128  VITAMINB12 320  FOLATE 19.8  FERRITIN 2,764*  TIBC 157*  IRON 57    Coagulation profile No results for input(s): INR, PROTIME in the last 168 hours.  No results for input(s): DDIMER in the last 72 hours.  Cardiac Enzymes No results for input(s): CKMB, TROPONINI, MYOGLOBIN in the last 168 hours.  Invalid input(s): CK ------------------------------------------------------------------------------------------------------------------ No results found for: BNP   Roxan Hockey M.D on 05/06/2019 at 10:17 AM  Go to [email or web address] - for contact info  [facility] - Office  [phone]

## 2019-05-06 NOTE — Progress Notes (Signed)
Mark Frey   DOB:[birth year]   BE#:[id number]   XJO#:[id number]  Subjective:  Slept well;not getting OOB much; no pain, cough, SOB, pleurisy, or other symptoms; no family in room   Objective: middle aged White man examined in bed Vitals:   05/05/19 2101 05/06/19 0535  BP: 98/68 95/73  Pulse: 69 (!) 52  Resp: 18 16  Temp: 97.9 F (36.6 C) 98.1 F (36.7 C)  SpO2: 98% 99%    There is no height or weight on file to calculate BMI.  Intake/Output Summary (Last 24 hours) at 05/06/2019 0815 Last data filed at 05/06/2019 0739 Gross per 24 hour  Intake 5712.29 ml  Output 2350 ml  Net 3362.29 ml      CBG (last 3)  Recent Labs    05/05/19 1714 05/05/19 2206 05/06/19 0729  GLUCAP 132* 106* 82     Labs:  Lab Results  Component Value Date   WBC 0.8 (LL) 05/06/2019   HGB 9.1 (L) 05/06/2019   HCT 28.7 (L) 05/06/2019   MCV 103.6 (H) 05/06/2019   PLT 136 (L) 05/06/2019   NEUTROABS 4.8 03/22/2019    @LASTCHEMISTRY @  Urine Studies No results for input(s): UHGB, CRYS in the last 72 hours.  Invalid input(s): UACOL, UAPR, USPG, UPH, UTP, UGL, UKET, UBIL, UNIT, UROB, ULEU, UEPI, UWBC, URBC, UBAC, CAST, UCOM, BILUA  Basic Metabolic Panel: Recent Labs  Lab 05/04/19 1617 05/05/19 0500  NA 134* 135  K 3.4* 3.1*  CL 98 102  CO2 25 24  GLUCOSE 236* 110*  BUN 10 9  CREATININE 0.52* 0.39*  CALCIUM 9.1 8.4*   GFR Estimated Creatinine Clearance: 95.7 mL/min (A) (by C-G formula based on SCr of 0.39 mg/dL (L)). Liver Function Tests: Recent Labs  Lab 05/04/19 1617 05/05/19 0500  AST 25 17  ALT 58* 45*  ALKPHOS 96 74  BILITOT 0.5 0.6  PROT 6.6 5.9*  ALBUMIN 2.8* 2.5*   Recent Labs  Lab 05/04/19 1617  LIPASE 21   No results for input(s): AMMONIA in the last 168 hours. Coagulation profile No results for input(s): INR, PROTIME in the last 168 hours.  CBC: Recent Labs  Lab 05/04/19 1617 05/05/19 0500 05/05/19 2128 05/06/19 0425  WBC 0.6* 0.8*  --  0.8*  HGB  8.1* 6.8* 8.8* 9.1*  HCT 25.8* 22.3* 28.1* 28.7*  MCV 104.5* 107.7*  --  103.6*  PLT 157 138*  --  136*   Cardiac Enzymes: No results for input(s): CKTOTAL, CKMB, CKMBINDEX, TROPONINI in the last 168 hours. BNP: Invalid input(s): POCBNP CBG: Recent Labs  Lab 05/05/19 0722 05/05/19 1131 05/05/19 1714 05/05/19 2206 05/06/19 0729  GLUCAP 104* 99 132* 106* 82   D-Dimer No results for input(s): DDIMER in the last 72 hours. Hgb A1c No results for input(s): HGBA1C in the last 72 hours. Lipid Profile No results for input(s): CHOL, HDL, LDLCALC, TRIG, CHOLHDL, LDLDIRECT in the last 72 hours. Thyroid function studies No results for input(s): TSH, T4TOTAL, T3FREE, THYROIDAB in the last 72 hours.  Invalid input(s): FREET3 Anemia work up [employer]    05/05/19 2128  VITAMINB12 320  FOLATE 19.8  FERRITIN 2,764*  TIBC 157*  IRON 57   Microbiology Recent Results (from the past 240 hour(s))  Blood Culture (routine x 2)     Status: None (Preliminary result)   Collection Time: 05/04/19  5:29 PM   Specimen: BLOOD  Result Value Ref Range Status   Specimen Description   Final    BLOOD  PORTA CATH Performed at [facility], [place] [address], [place], [place] [address]    Special Requests   Final    BOTTLES DRAWN AEROBIC AND ANAEROBIC Blood Culture adequate volume Performed at [facility] [address] [place], [place] [address]    Culture  Setup Time   Final    GRAM NEGATIVE RODS AEROBIC BOTTLE ONLY CRITICAL RESULT CALLED TO, READ BACK BY AND VERIFIED WITH: PHARMD J LEGGE [id number] AT 1158 AM BY CM Performed at [facility], [place] [address] [place], [place] [address]    Culture GRAM NEGATIVE RODS  Final   Report Status PENDING  Incomplete  SARS CORONAVIRUS 2 Nasal Swab Aptima Multi Swab     Status: None   Collection Time: 05/04/19  5:29 PM   Specimen: Aptima Multi Swab; Nasal Swab  Result Value Ref Range Status   SARS Coronavirus 2  NEGATIVE NEGATIVE Final    Comment: (NOTE) SARS-CoV-2 target nucleic acids are NOT DETECTED. The SARS-CoV-2 RNA is generally detectable in upper and lower respiratory specimens during the acute phase of infection. Negative results do not preclude SARS-CoV-2 infection, do not rule out co-infections with other pathogens, and should not be used as the sole basis for treatment or other patient management decisions. Negative results must be combined with clinical observations, patient history, and epidemiological information. The expected result is Negative. Fact Sheet for Patients: SugarRoll.be Fact Sheet for Healthcare Providers: https://www.woods-mathews.com/ This test is not yet approved or cleared by the [place] FDA and  has been authorized for detection and/or diagnosis of SARS-CoV-2 by FDA under an Emergency Use Authorization (EUA). This EUA will remain  in effect (meaning this test can be used) for the duration of the COVID-19 declaration under Section 56 4(b)(1) of the Act, 21 U.S.C. section 360bbb-3(b)(1), unless the authorization is terminated or revoked sooner. Performed at [facility], [place] [address], [place], [place] [address]   Blood Culture ID Panel (Reflexed)     Status: Abnormal   Collection Time: 05/04/19  5:29 PM  Result Value Ref Range Status   Enterococcus species NOT DETECTED NOT DETECTED Final   Listeria monocytogenes NOT DETECTED NOT DETECTED Final   Staphylococcus species NOT DETECTED NOT DETECTED Final   Staphylococcus aureus (BCID) NOT DETECTED NOT DETECTED Final   Streptococcus species NOT DETECTED NOT DETECTED Final   Streptococcus agalactiae NOT DETECTED NOT DETECTED Final   Streptococcus pneumoniae NOT DETECTED NOT DETECTED Final   Streptococcus pyogenes NOT DETECTED NOT DETECTED Final   Acinetobacter baumannii NOT DETECTED NOT DETECTED Final   Enterobacteriaceae species DETECTED (A) NOT DETECTED  Final    Comment: Enterobacteriaceae represent a large family of gram-negative bacteria, not a single organism. CRITICAL RESULT CALLED TO, READ BACK BY AND VERIFIED WITH: PHARMD J LEGGE [id number] AT 1158 AM BY CM    Enterobacter cloacae complex NOT DETECTED NOT DETECTED Final   Escherichia coli DETECTED (A) NOT DETECTED Final    Comment: CRITICAL RESULT CALLED TO, READ BACK BY AND VERIFIED WITH: PHARMD J LEGGE [id number] AT 0370 AM BY CM    Klebsiella oxytoca NOT DETECTED NOT DETECTED Final   Klebsiella pneumoniae NOT DETECTED NOT DETECTED Final   Proteus species NOT DETECTED NOT DETECTED Final   Serratia marcescens NOT DETECTED NOT DETECTED Final   Carbapenem resistance NOT DETECTED NOT DETECTED Final   Haemophilus influenzae NOT DETECTED NOT DETECTED Final   Neisseria meningitidis NOT DETECTED NOT DETECTED Final   Pseudomonas aeruginosa  NOT DETECTED NOT DETECTED Final   Candida albicans NOT DETECTED NOT DETECTED Final   Candida glabrata NOT DETECTED NOT DETECTED Final   Candida krusei NOT DETECTED NOT DETECTED Final   Candida parapsilosis NOT DETECTED NOT DETECTED Final   Candida tropicalis NOT DETECTED NOT DETECTED Final    Comment: Performed at [facility], [place] [address] [place], [place] [address]  Blood Culture (routine x 2)     Status: None (Preliminary result)   Collection Time: 05/04/19  5:47 PM   Specimen: BLOOD  Result Value Ref Range Status   Specimen Description   Final    BLOOD RIGHT WRIST Performed at [facility] [address] [place], [place] [address]    Special Requests   Final    BOTTLES DRAWN AEROBIC AND ANAEROBIC Blood Culture adequate volume Performed at [facility] [address] [place], [place] [address]    Culture   Final    NO GROWTH 2 DAYS Performed at [facility] [address], [place],  [address]    Report Status PENDING  Incomplete      Studies:  Ct Abdomen Pelvis W  Contrast  Addendum Date: 05/04/2019   ADDENDUM REPORT: 05/04/2019 19:43 ADDENDUM: The appearance of the LEFT LATERAL abdominal wall abscess is stable compared with most recent comparison, 05/02/2019. The salient findings were discussed with Lacretia Leigh on 05/04/2019 at 7:43 pm. Electronically Signed   By: Nolon Nations M.D.   On: 05/04/2019 19:43   Result Date: 05/04/2019 CLINICAL DATA:  Patient states he was called by his physician to come to the ED for an abscess in his colon. Patient has been having a low grade fever at home patient denies any pain at this time. Follow CT scan from 8/19. Per patient he has back pain. Patient's history of lymphoma and bowel obstruction. EXAM: CT ABDOMEN AND PELVIS WITH CONTRAST TECHNIQUE: Multidetector CT imaging of the abdomen and pelvis was performed using the standard protocol following bolus administration of intravenous contrast. CONTRAST:  178m OMNIPAQUE IOHEXOL 300 MG/ML  SOLN COMPARISON:  02/27/2019, 05/02/2019, and multiple prior studies FINDINGS: Lower chest: Subsegmental atelectasis at the lung bases. Hepatobiliary: Small cysts are identified. Gallbladder is present. Note is made of a Phrygian cap. No evidence for acute cholecystitis. Pancreas: Unremarkable. No pancreatic ductal dilatation or surrounding inflammatory changes. Spleen: Normal in size without focal abnormality. Adrenals/Urinary Tract: Normal adrenal glands. Small bilateral renal cysts. Intrarenal calculus in the RIGHT kidney measures 7 millimeters. Punctate calcification is identified in the UPPER pole of the LEFT kidney, 2 millimeters in diameter. No solid renal mass or hydronephrosis. The ureters are unremarkable. Stomach/Bowel: Stomach is unremarkable. RIGHT LOWER QUADRANT loop ileostomy is nonobstructing. There continues to be significant tethering and inflammatory change in the central pelvis and LEFT LOWER QUADRANT, extending to the LEFT pelvic sidewall. The cecum and sigmoid colon appear to  be adherent. There are coarse calcifications in the region of the soft tissue between the cecum and sigmoid colon. There is persistent tethering of the descending colon with the LEFT LATERAL abdominal wall. There is persistent focal low-attenuation within the LEFT LATERAL peritoneal reflection, consistent with abscess, now measuring 2.7 x 1.7 centimeters. This is best seen on image 35 of series 7. There is associated subcutaneous edema overlying the LEFT LATERAL abdominal wall. Vascular/Lymphatic: There is atherosclerotic calcification of the abdominal aorta. No aneurysm. No retroperitoneal or mesenteric adenopathy. Reproductive: Prostate is unremarkable. Other: None Musculoskeletal: Degenerative changes are  seen in the LOWER thoracic and lumbar spine. No suspicious lytic or blastic lesions are identified. IMPRESSION: 1. Persistent tethering of the descending colon with the LEFT LATERAL abdominal wall, associated with abscess within the LEFT LATERAL peritoneal reflection measuring 2.7 x 1.7 centimeters. 2. No evidence for bowel obstruction. 3. Bilateral nonobstructing intrarenal calculi. 4. Hepatic and renal cysts. 5.  Aortic atherosclerosis.  (ICD10-I70.0) 6. Degenerative changes in the LOWER thoracic and lumbar spine. Electronically Signed: By: Nolon Nations M.D. On: 05/04/2019 19:31   Dg Chest Port 1 View  Result Date: 05/04/2019 CLINICAL DATA:  Cough, low-grade fever, diabetes mellitus, history lymphoma EXAM: PORTABLE CHEST 1 VIEW COMPARISON:  Portable exam 1820 hours compared to 01/01/2019 FINDINGS: RIGHT jugular Port-A-Cath with tip projecting over SVC. Mediastinal contours and pulmonary vascularity normal. Bibasilar scarring unchanged. No acute infiltrate, pleural effusion or pneumothorax. No acute osseous findings. IMPRESSION: Bibasilar scarring. No acute abnormalities. Electronically Signed   By: Lavonia Dana M.D.   On: 05/04/2019 18:48    Assessment: 63 y.o. 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/11/2018             (b) biopsies of ileum and colon 03/01/2019 show no granulomas or lymphoma  (3) admitted with e coli sepsis 05/04/2019             (a) on cefepime, started 05/04/2019             (b) final culture and Sn pending  (4) severe leukopenia             REVIEW OF BLOOD FILM shows no tailed poikylocytes or NRBCs and no significant anisocytosis; occ giant platelet and some platelet clumps; no polys or bands; rare monocytes and mature lymphs, slightly more frequent atypical lymphs w/o granular cytoplasm-- no vacuolation  (a) neupogen started 05/05/2019  (5) anemia requiring transfusion             (a) anemia workup shows ferritin 2764, folate 19.8, B-12 320, LDH 57 and negative DAT    Plan:  Clinically stable. BCX confirm e coli but sensitivities pending. Continues on cefepime. WBC no change-- continue GCSF for now. Anemia workup unremarkable--likely anemia of chronic illness and likely marrow insufficiency given prior therapy. Retics pending,  Dr [name] will be back tomorrow. Please let me know if we can be of help before then.   Chauncey Cruel, MD 05/06/2019  8:15 AM Medical Oncology and Hematology [facility] [address] [place],  [address] Tel. [phone]    Fax. [phone]

## 2019-05-07 ENCOUNTER — Telehealth: Payer: Self-pay | Admitting: Gastroenterology

## 2019-05-07 DIAGNOSIS — D709 Neutropenia, unspecified: Secondary | ICD-10-CM

## 2019-05-07 LAB — CBC WITH DIFFERENTIAL/PLATELET
Abs Immature Granulocytes: 0.01 10*3/uL (ref 0.00–0.07)
Basophils Absolute: 0 10*3/uL (ref 0.0–0.1)
Basophils Relative: 1 %
Eosinophils Absolute: 0 10*3/uL (ref 0.0–0.5)
Eosinophils Relative: 1 %
HCT: 29.6 % — ABNORMAL LOW (ref 39.0–52.0)
Hemoglobin: 9.3 g/dL — ABNORMAL LOW (ref 13.0–17.0)
Immature Granulocytes: 1 %
Lymphocytes Relative: 51 %
Lymphs Abs: 0.6 10*3/uL — ABNORMAL LOW (ref 0.7–4.0)
MCH: 32.5 pg (ref 26.0–34.0)
MCHC: 31.4 g/dL (ref 30.0–36.0)
MCV: 103.5 fL — ABNORMAL HIGH (ref 80.0–100.0)
Monocytes Absolute: 0.4 10*3/uL (ref 0.1–1.0)
Monocytes Relative: 34 %
Neutro Abs: 0.1 10*3/uL — ABNORMAL LOW (ref 1.7–7.7)
Neutrophils Relative %: 12 %
Platelets: 141 10*3/uL — ABNORMAL LOW (ref 150–400)
RBC: 2.86 MIL/uL — ABNORMAL LOW (ref 4.22–5.81)
RDW: 16.9 % — ABNORMAL HIGH (ref 11.5–15.5)
WBC: 1.2 10*3/uL — CL (ref 4.0–10.5)
nRBC: 0 % (ref 0.0–0.2)

## 2019-05-07 LAB — TYPE AND SCREEN
ABO/RH(D): O POS
Antibody Screen: NEGATIVE
Unit division: 0
Unit division: 0

## 2019-05-07 LAB — BPAM RBC
Blood Product Expiration Date: 202009192359
Blood Product Expiration Date: 202009192359
ISSUE DATE / TIME: 202008221218
ISSUE DATE / TIME: 202008221547
Unit Type and Rh: 5100
Unit Type and Rh: 5100

## 2019-05-07 LAB — GLUCOSE, CAPILLARY
Glucose-Capillary: 81 mg/dL (ref 70–99)
Glucose-Capillary: 131 mg/dL — ABNORMAL HIGH (ref 70–99)
Glucose-Capillary: 122 mg/dL — ABNORMAL HIGH (ref 70–99)
Glucose-Capillary: 118 mg/dL — ABNORMAL HIGH (ref 70–99)

## 2019-05-07 LAB — HIV ANTIBODY (ROUTINE TESTING W REFLEX): HIV Screen 4th Generation wRfx: NONREACTIVE

## 2019-05-07 LAB — RETICULOCYTES
Immature Retic Fract: 15.7 % (ref 2.3–15.9)
RBC.: 2.86 MIL/uL — ABNORMAL LOW (ref 4.22–5.81)
Retic Count, Absolute: 46.3 10*3/uL (ref 19.0–186.0)
Retic Ct Pct: 1.6 % (ref 0.4–3.1)

## 2019-05-07 NOTE — Telephone Encounter (Signed)
The pt's wife called with questions about why the pt was admitted.  I advised her she would need to speak with the nurses and docs at the hospital.  She asked to have Dr Ardis Hughs please call her.  [phone].  Dr Ardis Hughs will you call.

## 2019-05-07 NOTE — Telephone Encounter (Signed)
I spoke with his wife.  He needs ROV with me in 2-3 weeks, double book if needed, hospital follow up.  thanks

## 2019-05-07 NOTE — Progress Notes (Addendum)
Mark Frey   DOB:13-Aug-1956   HT#:[id number]   XBW#:[id number]  Subjective:  Reports that he is feeling better today.  He denies abdominal pain.  Denies nausea and vomiting.  Has remained afebrile over the past 24 hours.  Denies chills.  Objective:  General: Middle aged White man examined in bed HEENT: No mucositis or thrush Lungs: Clear to auscultation bilaterally Cardiovascular: Regular rate and rhythm, no murmurs.  No lower extremity edema. Abdomen: Bowel sounds positive, soft, nontender.  Colostomy bag in place. Neuro: Alert and oriented x3.  Vitals:   05/07/19 1500 05/07/19 2120  BP: 103/69 105/79  Pulse:  73  Resp:  18  Temp:  97.6 F (36.4 C)  SpO2:  99%    There is no height or weight on file to calculate BMI.  Intake/Output Summary (Last 24 hours) at 05/08/2019 0035 Last data filed at 05/08/2019 0029 Gross per 24 hour  Intake 4361.68 ml  Output 3500 ml  Net 861.68 ml      CBG (last 3)  Recent Labs    05/07/19 1207 05/07/19 1645 05/07/19 2143  GLUCAP 131* 122* 118*     Labs:  Lab Results  Component Value Date   WBC 1.2 (LL) 05/07/2019   HGB 9.3 (L) 05/07/2019   HCT 29.6 (L) 05/07/2019   MCV 103.5 (H) 05/07/2019   PLT 141 (L) 05/07/2019   NEUTROABS 0.1 (L) 05/07/2019    @LASTCHEMISTRY @  Urine Studies No results for input(s): UHGB, CRYS in the last 72 hours.  Invalid input(s): UACOL, UAPR, USPG, UPH, UTP, UGL, UKET, UBIL, UNIT, UROB, ULEU, UEPI, UWBC, URBC, UBAC, CAST, UCOM, BILUA  Basic Metabolic Panel: Recent Labs  Lab 05/04/19 1617 05/05/19 0500  NA 134* 135  K 3.4* 3.1*  CL 98 102  CO2 25 24  GLUCOSE 236* 110*  BUN 10 9  CREATININE 0.52* 0.39*  CALCIUM 9.1 8.4*   GFR Estimated Creatinine Clearance: 95.7 mL/min (A) (by C-G formula based on SCr of 0.39 mg/dL (L)). Liver Function Tests: Recent Labs  Lab 05/04/19 1617 05/05/19 0500  AST 25 17  ALT 58* 45*  ALKPHOS 96 74  BILITOT 0.5 0.6  PROT 6.6 5.9*  ALBUMIN 2.8* 2.5*    Recent Labs  Lab 05/04/19 1617  LIPASE 21   No results for input(s): AMMONIA in the last 168 hours. Coagulation profile No results for input(s): INR, PROTIME in the last 168 hours.  CBC: Recent Labs  Lab 05/04/19 1617 05/05/19 0500 05/05/19 2128 05/06/19 0425 05/07/19 0452  WBC 0.6* 0.8*  --  0.8* 1.2*  NEUTROABS  --   --   --   --  0.1*  HGB 8.1* 6.8* 8.8* 9.1* 9.3*  HCT 25.8* 22.3* 28.1* 28.7* 29.6*  MCV 104.5* 107.7*  --  103.6* 103.5*  PLT 157 138*  --  136* 141*   Cardiac Enzymes: No results for input(s): CKTOTAL, CKMB, CKMBINDEX, TROPONINI in the last 168 hours. BNP: Invalid input(s): POCBNP CBG: Recent Labs  Lab 05/06/19 2120 05/07/19 0722 05/07/19 1207 05/07/19 1645 05/07/19 2143  GLUCAP 103* 81 131* 122* 118*   D-Dimer No results for input(s): DDIMER in the last 72 hours. Hgb A1c No results for input(s): HGBA1C in the last 72 hours. Lipid Profile No results for input(s): CHOL, HDL, LDLCALC, TRIG, CHOLHDL, LDLDIRECT in the last 72 hours. Thyroid function studies No results for input(s): TSH, T4TOTAL, T3FREE, THYROIDAB in the last 72 hours.  Invalid input(s): FREET3 Anemia work up [employer]  05/05/19 2128 05/07/19 0452  VITAMINB12 320  --   FOLATE 19.8  --   FERRITIN 2,764*  --   TIBC 157*  --   IRON 57  --   RETICCTPCT  --  1.6   Microbiology Recent Results (from the past 240 hour(s))  Blood Culture (routine x 2)     Status: Abnormal (Preliminary result)   Collection Time: 05/04/19  5:29 PM   Specimen: BLOOD  Result Value Ref Range Status   Specimen Description   Final    BLOOD PORTA CATH Performed at [facility] [address], [place], [place] [address]    Special Requests   Final    BOTTLES DRAWN AEROBIC AND ANAEROBIC Blood Culture adequate volume Performed at [facility] [address], [place], [place] [address]    Culture  Setup Time   Final    GRAM NEGATIVE RODS AEROBIC BOTTLE  ONLY CRITICAL RESULT CALLED TO, READ BACK BY AND VERIFIED WITH: PHARMD J LEGGE [id number] AT 1158 AM BY CM Performed at [facility], [place] [address] [place], [place] [address]    Culture ESCHERICHIA COLI (A)  Final   Report Status PENDING  Incomplete  SARS CORONAVIRUS 2 Nasal Swab Aptima Multi Swab     Status: None   Collection Time: 05/04/19  5:29 PM   Specimen: Aptima Multi Swab; Nasal Swab  Result Value Ref Range Status   SARS Coronavirus 2 NEGATIVE NEGATIVE Final    Comment: (NOTE) SARS-CoV-2 target nucleic acids are NOT DETECTED. The SARS-CoV-2 RNA is generally detectable in upper and lower respiratory specimens during the acute phase of infection. Negative results do not preclude SARS-CoV-2 infection, do not rule out co-infections with other pathogens, and should not be used as the sole basis for treatment or other patient management decisions. Negative results must be combined with clinical observations, patient history, and epidemiological information. The expected result is Negative. Fact Sheet for Patients: SugarRoll.be Fact Sheet for Healthcare Providers: https://www.woods-mathews.com/ This test is not yet approved or cleared by the [place] FDA and  has been authorized for detection and/or diagnosis of SARS-CoV-2 by FDA under an Emergency Use Authorization (EUA). This EUA will remain  in effect (meaning this test can be used) for the duration of the COVID-19 declaration under Section 56 4(b)(1) of the Act, 21 U.S.C. section 360bbb-3(b)(1), unless the authorization is terminated or revoked sooner. Performed at [facility], [place] [address] [place], [place] [address]   Blood Culture ID Panel (Reflexed)     Status: Abnormal   Collection Time: 05/04/19  5:29 PM  Result Value Ref Range Status   Enterococcus species NOT DETECTED NOT DETECTED Final   Listeria monocytogenes NOT DETECTED NOT DETECTED Final    Staphylococcus species NOT DETECTED NOT DETECTED Final   Staphylococcus aureus (BCID) NOT DETECTED NOT DETECTED Final   Streptococcus species NOT DETECTED NOT DETECTED Final   Streptococcus agalactiae NOT DETECTED NOT DETECTED Final   Streptococcus pneumoniae NOT DETECTED NOT DETECTED Final   Streptococcus pyogenes NOT DETECTED NOT DETECTED Final   Acinetobacter baumannii NOT DETECTED NOT DETECTED Final   Enterobacteriaceae species DETECTED (A) NOT DETECTED Final    Comment: Enterobacteriaceae represent a large family of gram-negative bacteria, not a single organism. CRITICAL RESULT CALLED TO, READ BACK BY AND VERIFIED WITH: PHARMD J LEGGE [id number] AT 1158 AM BY CM    Enterobacter cloacae complex NOT DETECTED NOT DETECTED Final   Escherichia coli DETECTED (A) NOT  DETECTED Final    Comment: CRITICAL RESULT CALLED TO, READ BACK BY AND VERIFIED WITH: PHARMD J LEGGE [id number] AT 2620 AM BY CM    Klebsiella oxytoca NOT DETECTED NOT DETECTED Final   Klebsiella pneumoniae NOT DETECTED NOT DETECTED Final   Proteus species NOT DETECTED NOT DETECTED Final   Serratia marcescens NOT DETECTED NOT DETECTED Final   Carbapenem resistance NOT DETECTED NOT DETECTED Final   Haemophilus influenzae NOT DETECTED NOT DETECTED Final   Neisseria meningitidis NOT DETECTED NOT DETECTED Final   Pseudomonas aeruginosa NOT DETECTED NOT DETECTED Final   Candida albicans NOT DETECTED NOT DETECTED Final   Candida glabrata NOT DETECTED NOT DETECTED Final   Candida krusei NOT DETECTED NOT DETECTED Final   Candida parapsilosis NOT DETECTED NOT DETECTED Final   Candida tropicalis NOT DETECTED NOT DETECTED Final    Comment: Performed at [facility], [place]. [address] [place], [place] [address]  Blood Culture (routine x 2)     Status: None (Preliminary result)   Collection Time: 05/04/19  5:47 PM   Specimen: BLOOD  Result Value Ref Range Status   Specimen Description   Final    BLOOD RIGHT WRIST Performed at [facility] [address], [place], [place] [address]    Special Requests   Final    BOTTLES DRAWN AEROBIC AND ANAEROBIC Blood Culture adequate volume Performed at [facility] [address], [place], [place] [address]    Culture   Final    NO GROWTH 3 DAYS Performed at [facility], [place] [address] [place], [place] [address]    Report Status PENDING  Incomplete      Studies:  No results found.  Assessment and plan: 63 y.o. 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             (a) s/p R-EPOCH x 6, last dose 07/06/2018             (b) restaging PET 07/29/2019 shows good response but pericolonic inflammation  (1) Hx DVT/PE at presentation June 2019  (2) persistent colitis with recurrent SBO             (a) s/p diverting loop ileostomy 09/11/2018             (b) biopsies of ileum and colon 03/01/2019 show no granulomas or lymphoma  (3) admitted with e coli sepsis 05/04/2019- likely from abd abscess             (a) on cefepime, started 05/04/2019             (b) sensitivities pending  (4) severe leukopenia             REVIEW OF BLOOD FILM shows no tailed poikylocytes or NRBCs and no significant anisocytosis; occ giant platelet and some platelet clumps; no polys or bands; rare monocytes and mature lymphs, slightly more frequent atypical lymphs w/o granular cytoplasm-- no vacuolation  (a) Granix started 05/05/2019; total white blood cell count and ANC remain low.  Continue Granix.  (5) anemia requiring transfusion             (a) anemia workup shows ferritin 2764, folate 19.8, B-12 320, LDH 57 and negative DAT.  Hemoglobin remained stable.  No transfusion indicated today.   Sullivan Lone, MD 05/08/2019  12:35 AM Medical Oncology and Hematology [facility] [address] [place], [place] [address] Tel. [phone]  Fax. [phone]  ADDENDUM .Patient was  Personally and independently interviewed, examined and relevant elements of the history of present illness were reviewed in details and an assessment and plan was created. All elements of the patient's history of present illness , assessment and plan were discussed in details with Mark Bussing, NP. The above documentation reflects our combined findings assessment and plan.  -E.coli sepsis -- mx per hospitalist -Neutropenia/leucocytis from sepsis -- continue Granix daily till ANC>1500 x2 days Sullivan Lone MD MS

## 2019-05-07 NOTE — Telephone Encounter (Signed)
05/25/19 at 830 am with Dr Ardis Hughs pt's wife notified

## 2019-05-07 NOTE — Progress Notes (Signed)
Patient refused to have his dressing to his buttocks changed

## 2019-05-07 NOTE — TOC Progression Note (Signed)
Transition of Care [facility]) - Progression Note    Patient Details  Name: Mark Frey MRN: [id number] Date of Birth: 1956/03/28  Transition of Care [facility]) CM/SW Contact  Leeroy Cha, RN Phone Number: 05/07/2019, 11:27 AM  Clinical Narrative:     Inpatient & Surgical Care > Rapid Review Guidelines > Adult > Infectious Disease >Sepsis and Other Febrile Illness, without Focal Infection RRG (M-160-RRG) Inpatient & Surgical Care > Rapid Review Guidelines > Adult > Infectious Disease >Sepsis and Other Febrile Illness, without Focal Infection RRG (M-160-RRG)  Sepsis and Other Febrile Illness, without Focal Infection RRG RRG: M-160-RRG (ISC)  Link to [place] 24th Edition   Clinical Indications for Admission  Level of Care Criteria  Hospitalization ? Goal Length of Stay  - Ambulatory or 3 days ? Discharge Readiness ? Extended Stay ? Discharge Destination  Footnotes  Codes    Clinical Indications for Admission Return to top of Sepsis and Other Febrile Illness, without Focal Infection RRG - ISC Observation Care Inpatient Care   Observation is appropriate for patient with 1 or more of the following[A][B]: ? Immunocompromised state (eg, asplenic, long-term systemic corticosteroids)[C] ? Vital sign abnormality ? Altered mental status ? Tachypnea ? Metabolic derangement (eg, dehydration, acidosis) ? Evidence of end organ dysfunction (eg, rising creatinine, myocardial ischemia, rising liver function tests) ? Ability to maintain hydration orally unclear ? Significantly elevated markers of infection (eg, more than 10% bandemia, C-reactive protein or procalcitonin levels more than 2 standard deviations above normal) ? Need for clinical observation of response to treatment (eg, parenteral antimicrobial) or until results of diagnostic studies (eg, cultures, fluid analysis) are available or while treatment at lower level of care is arranged ?  Temperature greater than 103.1 degrees F (39.5 degrees C) (oral) ? Fever in infant age 32 days to 16 days ? Child whose situation includes 1 or more of the following:  Clinical response to outpatient therapy uncertain  Outpatient supervision by parents or caregivers uncertain  Ability of caregivers to comply with appropriate follow-up or need to return if condition worsens not certain  Admission is indicated for 1 or more of the following[D][E][F]: ? Hemodynamic instability ? Bacteremia ? Hypoxemia ? Altered mental status that is severe or persistent ? Failure of outpatient treatment ? New coagulopathy (eg, reduced platelet count consistent with disseminated intravascular coagulation) ? Tachypnea not responsive to observation care treatment ? Dehydration that is severe or persistent ? Evidence of end organ dysfunction (eg, rising creatinine, myocardial ischemia, rising liver function tests) that is severe or persists despite observation care treatment ? Temperature greater than 104.9 degrees F (40.5 degrees C) (oral) ? Core (rectal) temperature lower than 95 degrees F (35 degrees C) (eg, thought to be due to infection) ? Parenteral antimicrobial regimen that must be implemented on inpatient basis (eg, infusion or monitoring needs beyond capabilities of outpatient parenteral therapy) ? Isolation indicated that cannot be performed outside hospital setting  See Sepsis and Other Febrile Illness, without Focal InfectionISC Optimal Recovery Guideline for further supporting sections.   Level of Care Criteria Return to top of Sepsis and Other Febrile Illness, without Focal Infection RRG - ISC For patient conditions requiring more than routine inpatient care, documentation of need may be done using the [facility] of Care] chart.             Hospitalization Return to top of Sepsis and Other Febrile Illness, without Focal Infection RRG - ISC Goal Length  of Stay: Ambulatory or 3 days  Note: Goal  Length of Stay assumes optimal recovery, decision making, and care. Patients may be discharged to a lower level of care (either later than or sooner than the goal) when it is appropriate for their clinical status and care needs. Discharge Readiness Return to top of Sepsis and Other Febrile Illness, without Focal Infection RRG - [place]  Discharge readiness is indicated by patient meeting Recovery Milestones, including ALL of the following: ? Hemodynamic stability/stable ? Afebrile or fever improved absent ? Hypoxemia absent absent ? Mental status at baseline alert and oreinted ? End organ dysfunction (eg, myocardial ischemia, renal failure) absent  ? Metabolic derangement (eg, dehydration, acidosis) absent ? Cultures negative or infection identified and under adequate treatment ? E coli in cultures , WBC 1.2   Liquid diet   Iv abx   Iv pain meds        Expected Discharge Plan and Services                                                 Social Determinants of Health (SDOH) Interventions    Readmission Risk Interventions Readmission Risk Prevention Plan 02/08/2019  Transportation Screening Complete  Medication Review ([place]) Complete  PCP or Specialist appointment within 3-5 days of discharge Complete  HRI or [place] Complete  SW Recovery Care/Counseling Consult Complete  [place] Not Applicable  Some recent data might be hidden

## 2019-05-07 NOTE — Progress Notes (Signed)
PROGRESS NOTE    Mark Frey  LFY:[id number] DOB: 03/27/1956 DOA: 05/04/2019 PCP: [name], MD      Brief Narrative:  [name] is a 63 y.o. 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  In ER, he was tachycardic and febrile.  CT abdomen and pelvis showed colitis and abscess in the LLQ.  ANC <100.  He was started on empiric antibiotics with Cefepime and Flagyl.     Assessment & Plan:  Febrile neutropenia Abdominal abscess Sepsis from E coli bacteremia   Fever resolved.  WBC improving on Granix.   Had Pseudomonas and Morganella in abscess drained in May/June.  Here culture of blood with E coli. 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  Medication management as above.  The patient was admitted with febrile neutropenia.  His fever has resolved, his pain is improved and WBC is increasing on Granix.  Will consult ID and determine duration and route of antibiotics, hopefully prepared for discharge in 2-3 days.         DVT prophylaxis: SCDs Code Status: FULL Family Communication: Wife by phone    Consultants:   Oncology  Procedures:   None  Antimicrobials:   Cefepime  Flagyl     Subjective: Feeling better.  Appetite improved, stool output normal in stoma.  No fever, confusion.  No vomiting.  Left abdominal pain slightly better.  No cough.  Objective: Vitals:   05/06/19 1330 05/06/19 2112 05/07/19 0547 05/07/19 1345  BP: (!) 105/59 102/70 111/74 96/74  Pulse: (!) 106 63 74 91  Resp: 16 16 18 16   Temp: 98.8 F (37.1 C) 98.1 F (36.7 C) 98.2 F (36.8 C) 98.4 F (36.9 C)  TempSrc: Oral Oral Oral Oral  SpO2: 96% 98% 96% 93%    Intake/Output Summary (Last 24 hours) at 05/07/2019 1633 Last data filed at 05/07/2019 1325 Gross per 24 hour  Intake 3317.43 ml  Output 3300 ml  Net 17.43 ml   There were no vitals filed for this visit.  Examination: General appearance:  adult male, alert and in no acute distress.   HEENT: Anicteric, conjunctiva pink, lids and lashes normal. No nasal deformity, discharge, epistaxis.  Lips moist.   Skin: Warm and dry.  no jaundice.  No suspicious rashes or lesions. Cardiac: RRR, nl S1-S2, no murmurs appreciated.  Capillary refill is brisk.  JVP normal.  No LE edema.  Radia  pulses 2+ and symmetric. Respiratory: Normal respiratory rate and rhythm.  CTAB without rales or wheezes. Abdomen: Abdomen soft.  Mild left sided TTP without guarding. No ascites, distension, hepatosplenomegaly.   MSK: No deformities or effusions. Neuro: Awake and alert.  EOMI, moves all extremities. Speech fluent.    Psych: Sensorium intact and responding to questions, attention normal. Affect noraml.  Judgment and insight appear normal.  Data Reviewed: I have personally reviewed following labs and imaging studies:  CBC: Recent Labs  Lab 05/04/19 1617 05/05/19 0500 05/05/19 2128 05/06/19 0425 05/07/19 0452  WBC 0.6* 0.8*  --  0.8* 1.2*  NEUTROABS  --   --   --   --  0.1*  HGB 8.1* 6.8* 8.8* 9.1* 9.3*  HCT 25.8* 22.3* 28.1* 28.7* 29.6*  MCV 104.5* 107.7*  --  103.6* 103.5*  PLT 157 138*  --  136* 758*   Basic Metabolic Panel: Recent Labs  Lab  05/04/19 1617 05/05/19 0500  NA 134* 135  K 3.4* 3.1*  CL 98 102  CO2 25 24  GLUCOSE 236* 110*  BUN 10 9  CREATININE 0.52* 0.39*  CALCIUM 9.1 8.4*   GFR: Estimated Creatinine Clearance: 95.7 mL/min (A) (by C-G formula based on SCr of 0.39 mg/dL (L)). Liver Function Tests: Recent Labs  Lab 05/04/19 1617 05/05/19 0500  AST 25 17  ALT 58* 45*  ALKPHOS 96 74  BILITOT 0.5 0.6  PROT 6.6 5.9*  ALBUMIN 2.8* 2.5*   Recent Labs  Lab 05/04/19 1617  LIPASE 21   No results for input(s): AMMONIA in the last 168 hours. Coagulation Profile: No results for input(s): INR, PROTIME in the last 168 hours. Cardiac Enzymes: No results for input(s): CKTOTAL, CKMB, CKMBINDEX, TROPONINI in the last 168 hours. BNP (last 3 results) No results for input(s): PROBNP in the last 8760 hours. HbA1C: No results for input(s): HGBA1C in the last 72 hours. CBG: Recent Labs  Lab 05/06/19 1146 05/06/19 1626 05/06/19 2120 05/07/19 0722 05/07/19 1207  GLUCAP 79 135* 103* 81 131*   Lipid Profile: No results for input(s): CHOL, HDL, LDLCALC, TRIG, CHOLHDL, LDLDIRECT in the last 72 hours. Thyroid Function Tests: No results for input(s): TSH, T4TOTAL, FREET4, T3FREE, THYROIDAB in the last 72 hours. Anemia Panel: Recent Labs    05/05/19 2128 05/07/19 0452  VITAMINB12 320  --   FOLATE 19.8  --   FERRITIN 2,764*  --   TIBC 157*  --   IRON 57  --   RETICCTPCT  --  1.6   Urine analysis:    Component Value Date/Time   COLORURINE AMBER (A) 05/04/2019 1730   APPEARANCEUR HAZY (A) 05/04/2019 1730   LABSPEC 1.027 05/04/2019 1730   PHURINE 5.0 05/04/2019 1730   GLUCOSEU 50 (A) 05/04/2019 1730   HGBUR NEGATIVE 05/04/2019 1730   HGBUR negative 01/20/2009 0840   BILIRUBINUR NEGATIVE 05/04/2019 1730   BILIRUBINUR Small 01/30/2018 1652   KETONESUR NEGATIVE 05/04/2019 1730   PROTEINUR 30 (A) 05/04/2019 1730   UROBILINOGEN 2.0 (A) 01/30/2018 1652   UROBILINOGEN 0.2 01/20/2009 0840   NITRITE  NEGATIVE 05/04/2019 1730   LEUKOCYTESUR NEGATIVE 05/04/2019 1730   Sepsis Labs: @LABRCNTIP (procalcitonin:4,lacticacidven:4)  ) Recent Results (from the past 240 hour(s))  Blood Culture (routine x 2)     Status: Abnormal (Preliminary result)   Collection Time: 05/04/19  5:29 PM   Specimen: BLOOD  Result Value Ref Range Status   Specimen Description   Final    BLOOD PORTA CATH Performed at [facility], [place] [address], [place], [place] [address]    Special Requests   Final    BOTTLES DRAWN AEROBIC AND ANAEROBIC Blood Culture adequate volume Performed at [place] [address], [place], [place] [address]    Culture  Setup Time   Final    GRAM NEGATIVE RODS AEROBIC BOTTLE ONLY CRITICAL RESULT CALLED TO, READ BACK  BY AND VERIFIED WITH: PHARMD J LEGGE [id number] AT 1158 AM BY CM Performed at [facility], [place] [address] [place], [place] [address]    Culture ESCHERICHIA COLI (A)  Final   Report Status PENDING  Incomplete  SARS CORONAVIRUS 2 Nasal Swab Aptima Multi Swab     Status: None   Collection Time: 05/04/19  5:29 PM   Specimen: Aptima Multi Swab; Nasal Swab  Result Value Ref Range Status   SARS Coronavirus 2 NEGATIVE NEGATIVE Final    Comment: (NOTE) SARS-CoV-2 target nucleic acids are NOT DETECTED. The SARS-CoV-2 RNA is generally detectable in upper and lower respiratory specimens during the acute phase of infection. Negative results do not preclude SARS-CoV-2 infection, do not rule out co-infections with other pathogens, and should not be used as the sole basis for treatment or other patient management decisions. Negative results must be combined with clinical observations, patient history, and epidemiological information. The expected result is Negative. Fact Sheet for Patients: SugarRoll.be Fact Sheet for Healthcare Providers: https://www.woods-mathews.com/ This test is not yet  approved or cleared by the [place] FDA and  has been authorized for detection and/or diagnosis of SARS-CoV-2 by FDA under an Emergency Use Authorization (EUA). This EUA will remain  in effect (meaning this test can be used) for the duration of the COVID-19 declaration under Section 56 4(b)(1) of the Act, 21 U.S.C. section 360bbb-3(b)(1), unless the authorization is terminated or revoked sooner. Performed at [facility], [place] [address], [place], [place] [address]   Blood Culture ID Panel (Reflexed)     Status: Abnormal   Collection Time: 05/04/19  5:29 PM  Result Value Ref Range Status   Enterococcus species NOT DETECTED NOT DETECTED Final   Listeria monocytogenes NOT DETECTED NOT DETECTED Final   Staphylococcus species NOT DETECTED NOT DETECTED Final   Staphylococcus aureus (BCID) NOT DETECTED NOT DETECTED Final   Streptococcus species NOT DETECTED NOT DETECTED Final   Streptococcus agalactiae NOT DETECTED NOT DETECTED Final   Streptococcus pneumoniae NOT DETECTED NOT DETECTED Final   Streptococcus pyogenes NOT DETECTED NOT DETECTED Final   Acinetobacter baumannii NOT DETECTED NOT DETECTED Final   Enterobacteriaceae species DETECTED (A) NOT DETECTED Final    Comment: Enterobacteriaceae represent a large family of gram-negative bacteria, not a single organism. CRITICAL RESULT CALLED TO, READ BACK BY AND VERIFIED WITH: PHARMD J LEGGE [id number] AT 1158 AM BY CM    Enterobacter cloacae complex NOT DETECTED NOT DETECTED Final   Escherichia coli DETECTED (A) NOT DETECTED Final    Comment: CRITICAL RESULT CALLED TO, READ BACK BY AND VERIFIED WITH: PHARMD J LEGGE [id number] AT 0254 AM BY CM    Klebsiella oxytoca NOT DETECTED NOT DETECTED Final   Klebsiella pneumoniae NOT DETECTED NOT DETECTED Final   Proteus species NOT DETECTED NOT DETECTED Final   Serratia marcescens NOT DETECTED NOT DETECTED Final   Carbapenem resistance NOT DETECTED NOT DETECTED Final   Haemophilus influenzae  NOT DETECTED NOT DETECTED Final   Neisseria meningitidis NOT DETECTED NOT DETECTED Final   Pseudomonas aeruginosa NOT DETECTED NOT DETECTED Final   Candida albicans NOT DETECTED NOT DETECTED Final   Candida glabrata NOT DETECTED NOT DETECTED Final   Candida krusei NOT DETECTED NOT DETECTED Final   Candida parapsilosis NOT DETECTED NOT DETECTED Final   Candida tropicalis NOT DETECTED NOT DETECTED Final    Comment: Performed at [facility], [place]. [address] [place], [place] [address]  Blood Culture (routine x 2)  Status: None (Preliminary result)   Collection Time: 05/04/19  5:47 PM   Specimen: BLOOD  Result Value Ref Range Status   Specimen Description   Final    BLOOD RIGHT WRIST Performed at [place] [address], [place], [place] [address]    Special Requests   Final    BOTTLES DRAWN AEROBIC AND ANAEROBIC Blood Culture adequate volume Performed at [place] [address], [place], [place] [address]    Culture   Final    NO GROWTH 3 DAYS Performed at [facility], [place] [address] [place],  [address]    Report Status PENDING  Incomplete         Radiology Studies: No results found.      Scheduled Meds: . apixaban  5 mg Oral BID  . citalopram  20 mg Oral Daily  . dexamethasone  1 mg Oral q morning - 10a  . dronabinol  5 mg Oral Daily  . insulin aspart  0-5 Units Subcutaneous QHS  . insulin aspart  0-9 Units Subcutaneous TID WC  . magnesium oxide  400 mg Oral BID  . Tbo-Filgrastim  480 mcg Subcutaneous 1 day or 1 dose  . zinc sulfate  220 mg Oral BID   Continuous Infusions: . sodium chloride 100 mL/hr at 05/07/19 0928  . ceFEPime (MAXIPIME) IV 2 g (05/07/19 0931)  . metronidazole 500 mg (05/07/19 0945)     LOS: 3 days    Time spent: 25 minutes    Edwin Dada, MD [facility] 05/07/2019, 4:33 PM     Please page through [place]:  [email or web address] Password [email or web address]  7PM-7AM, please contact night-coverage    Estimated body mass index is 24.07 kg/m as calculated from the following:   Height as of an earlier encounter on 05/04/19: 5' 9"  (1.753 m).   Weight as of an earlier encounter on 05/04/19: 73.9 kg. Malnutrition Type:   Malnutrition Characteristics:   Nutrition Interventions:   Pressure Injury 01/04/19 Stage II -  Partial thickness loss of dermis presenting as a shallow open ulcer with a red, pink wound bed without slough. redness around wound bed (Active)  01/04/19 0930  Location:   Location Orientation: Left;Right;Medial  Staging: Stage II -  Partial thickness loss of dermis presenting as a shallow open ulcer with a red, pink wound bed without slough.  Wound Description (Comments): redness around wound bed  Present on Admission: Yes     Pressure Injury 02/10/19 Sacrum Mid Stage II -  Partial thickness loss of dermis presenting as a shallow open ulcer with a red, pink wound bed without slough. partial thickness of dermis, first layer of skin off presenting red/pink in color (Active)  02/10/19 1043  Location: Sacrum  Location Orientation: Mid  Staging: Stage II -  Partial thickness loss of dermis presenting as a shallow open ulcer with a red, pink wound bed without slough.  Wound Description (Comments): partial thickness of dermis, first layer of skin off presenting red/pink in color  Present on Admission:     . apixaban  5 mg Oral BID  . citalopram  20 mg Oral Daily  . dexamethasone  1 mg Oral q morning - 10a  . dronabinol  5 mg Oral Daily  . insulin aspart  0-5 Units Subcutaneous QHS  . insulin aspart  0-9 Units Subcutaneous TID WC  . magnesium oxide  400 mg Oral BID  . Tbo-Filgrastim  480 mcg Subcutaneous 1 day  or 1 dose  . zinc sulfate  220 mg Oral BID   . sodium chloride 100 mL/hr at 05/07/19 0928  . ceFEPime (MAXIPIME) IV 2 g (05/07/19 0931)  . metronidazole 500 mg (05/07/19 0945)    Current Meds  Medication Sig  .  acetaminophen (TYLENOL) 325 MG tablet Take 2 tablets (650 mg total) by mouth every 6 (six) hours as needed for mild pain (or Fever >/= 101).  [name] apixaban (ELIQUIS) 5 MG TABS tablet DO NOT TAKE UNTIL SEEN BY YOUR ONCOLOGIST (Patient taking differently: Take 5 mg by mouth 2 (two) times daily. DO NOT TAKE UNTIL SEEN BY YOUR ONCOLOGIST)  . B Complex Vitamins (VITAMIN B COMPLEX PO) Take by mouth.  . citalopram (CELEXA) 20 MG tablet Take 1 tablet (20 mg total) by mouth daily.  [name] dexamethasone (DECADRON) 1 MG tablet Take 1 mg by mouth every morning.  . dronabinol (MARINOL) 5 MG capsule Take 1 capsule (5 mg total) by mouth 2 (two) times daily before lunch and supper. (Patient taking differently: Take 5 mg by mouth daily. )  . HYDROcodone-acetaminophen (NORCO) 10-325 MG tablet Take 1 tablet by mouth every 6 (six) hours as needed for moderate pain or severe pain.  . magnesium oxide (MAG-OX) 400 (241.3 Mg) MG tablet Take 1 tablet (400 mg total) by mouth 2 (two) times daily.  . Zinc Sulfate 220 (50 Zn) MG TABS Take 1 tablet by mouth 2 (two) times daily.   acetaminophen, HYDROcodone-acetaminophen, ondansetron **OR** ondansetron (ZOFRAN) IV

## 2019-05-08 ENCOUNTER — Ambulatory Visit: Payer: BC Managed Care – PPO | Admitting: Gastroenterology

## 2019-05-08 LAB — CBC WITH DIFFERENTIAL/PLATELET
Abs Immature Granulocytes: 0 10*3/uL (ref 0.00–0.07)
Basophils Absolute: 0 10*3/uL (ref 0.0–0.1)
Basophils Relative: 1 %
Eosinophils Absolute: 0 10*3/uL (ref 0.0–0.5)
Eosinophils Relative: 1 %
HCT: 31.5 % — ABNORMAL LOW (ref 39.0–52.0)
Hemoglobin: 9.7 g/dL — ABNORMAL LOW (ref 13.0–17.0)
Immature Granulocytes: 0 %
Lymphocytes Relative: 48 %
Lymphs Abs: 0.7 10*3/uL (ref 0.7–4.0)
MCH: 32.3 pg (ref 26.0–34.0)
MCHC: 30.8 g/dL (ref 30.0–36.0)
MCV: 105 fL — ABNORMAL HIGH (ref 80.0–100.0)
Monocytes Absolute: 0.4 10*3/uL (ref 0.1–1.0)
Monocytes Relative: 27 %
Neutro Abs: 0.3 10*3/uL — ABNORMAL LOW (ref 1.7–7.7)
Neutrophils Relative %: 23 %
Platelets: 150 10*3/uL (ref 150–400)
RBC: 3 MIL/uL — ABNORMAL LOW (ref 4.22–5.81)
RDW: 16.6 % — ABNORMAL HIGH (ref 11.5–15.5)
WBC: 1.5 10*3/uL — ABNORMAL LOW (ref 4.0–10.5)
nRBC: 0 % (ref 0.0–0.2)

## 2019-05-08 LAB — COMPREHENSIVE METABOLIC PANEL
ALT: 18 U/L (ref 0–44)
AST: 10 U/L — ABNORMAL LOW (ref 15–41)
Albumin: 2.5 g/dL — ABNORMAL LOW (ref 3.5–5.0)
Alkaline Phosphatase: 65 U/L (ref 38–126)
Anion gap: 6 (ref 5–15)
BUN: 7 mg/dL — ABNORMAL LOW (ref 8–23)
CO2: 25 mmol/L (ref 22–32)
Calcium: 8.6 mg/dL — ABNORMAL LOW (ref 8.9–10.3)
Chloride: 107 mmol/L (ref 98–111)
Creatinine, Ser: 0.4 mg/dL — ABNORMAL LOW (ref 0.61–1.24)
GFR calc Af Amer: >60 mL/min (ref >60)
GFR calc non Af Amer: >60 mL/min (ref >60)
Glucose, Bld: 88 mg/dL (ref 70–99)
Potassium: 3.4 mmol/L — ABNORMAL LOW (ref 3.5–5.1)
Sodium: 138 mmol/L (ref 135–145)
Total Bilirubin: 0.5 mg/dL (ref 0.3–1.2)
Total Protein: 5.6 g/dL — ABNORMAL LOW (ref 6.5–8.1)

## 2019-05-08 LAB — GLUCOSE, CAPILLARY
Glucose-Capillary: 81 mg/dL (ref 70–99)
Glucose-Capillary: 170 mg/dL — ABNORMAL HIGH (ref 70–99)
Glucose-Capillary: 145 mg/dL — ABNORMAL HIGH (ref 70–99)
Glucose-Capillary: 145 mg/dL — ABNORMAL HIGH (ref 70–99)

## 2019-05-08 LAB — CULTURE, BLOOD (ROUTINE X 2): Special Requests: ADEQUATE

## 2019-05-08 LAB — PATHOLOGIST SMEAR REVIEW

## 2019-05-08 NOTE — Evaluation (Signed)
Physical Therapy Evaluation Patient Details Name: Mark Frey MRN: [id number] DOB: 1955-10-12 Today's Date: 05/08/2019   History of Present Illness  Mark Frey is a 63 y.o. male with medical history significant of lymphoma, diabetes, hypertension, hyperlipidemia who was on chemotherapy and has completed chemotherapy.  He came in with 2 days of fever and abdominal pain  Clinical Impression  Pt presents with decreased mobility affecting his Independence secondary to the above diagnosis. Pt would benefit from acute skilled PT to maximize mobility and Independence for d/c home with family providing assistance as needed. Pt reports he had HHPT services set-up and wants to continue services from his previous provider.    Follow Up Recommendations Home health PT    Equipment Recommendations  None recommended by PT    Recommendations for Other Services       Precautions / Restrictions Precautions Precautions: None Restrictions Weight Bearing Restrictions: No      Mobility  Bed Mobility Overal bed mobility: (NT-OOB with nursing)                Transfers Overall transfer level: Needs assistance Equipment used: None Transfers: Sit to/from Stand Sit to Stand: Min guard         General transfer comment: a little impulsive, but no LOB noted  Ambulation/Gait Ambulation/Gait assistance: Min guard Gait Distance (Feet): 90 Feet Assistive device: IV Pole Gait Pattern/deviations: Step-through pattern;Decreased stride length Gait velocity: decreased   General Gait Details: limited by fatigue, no LOB noted  Stairs            Wheelchair Mobility    Modified Rankin (Stroke Patients Only)       Balance Overall balance assessment: No apparent balance deficits (not formally assessed)                                           Pertinent Vitals/Pain Pain Assessment: 0-10 Pain Score: 4  Pain Location: abdomen Pain Descriptors / Indicators:  Discomfort Pain Intervention(s): Limited activity within patient's tolerance    Home Living Family/patient expects to be discharged to:: Private residence Living Arrangements: Spouse/significant other Available Help at Discharge: Family;Available 24 hours/day Type of Home: House Home Access: Stairs to enter Entrance Stairs-Rails: Left Entrance Stairs-Number of Steps: 2 Home Layout: One level Home Equipment: Cane - single point;Walker - 4 wheels      Prior Function Level of Independence: Independent with assistive device(s)               Hand Dominance        Extremity/Trunk Assessment   Upper Extremity Assessment Upper Extremity Assessment: Defer to OT evaluation    Lower Extremity Assessment Lower Extremity Assessment: Overall WFL for tasks assessed       Communication   Communication: No difficulties  Cognition Arousal/Alertness: Awake/alert Behavior During Therapy: WFL for tasks assessed/performed Overall Cognitive Status: Within Functional Limits for tasks assessed                                        General Comments General comments (skin integrity, edema, etc.): RN notified me of pt  WBC 1.5. Pt reported he felt good and agreeable to ambulate with mask on. RN approved.    Exercises     Assessment/Plan    PT Assessment Patient  needs continued PT services  PT Problem List Decreased mobility;Decreased safety awareness;Decreased activity tolerance;Decreased balance;Pain;Decreased strength       PT Treatment Interventions DME instruction;Therapeutic activities;Gait training;Therapeutic exercise;Patient/family education;Stair training;Balance training;Functional mobility training;Neuromuscular re-education    PT Goals (Current goals can be found in the Care Plan section)  Acute Rehab PT Goals Patient Stated Goal: To get better and walk without RW PT Goal Formulation: With patient Time For Goal Achievement: 05/22/19 Potential to  Achieve Goals: Good    Frequency Min 3X/week   Barriers to discharge        Co-evaluation               AM-PAC PT "6 Clicks" Mobility  Outcome Measure Help needed turning from your back to your side while in a flat bed without using bedrails?: A Little Help needed moving from lying on your back to sitting on the side of a flat bed without using bedrails?: A Little Help needed moving to and from a bed to a chair (including a wheelchair)?: A Little Help needed standing up from a chair using your arms (e.g., wheelchair or bedside chair)?: A Little Help needed to walk in hospital room?: A Little Help needed climbing 3-5 steps with a railing? : A Little 6 Click Score: 18    End of Session Equipment Utilized During Treatment: Gait belt Activity Tolerance: Patient tolerated treatment well Patient left: in chair;with call bell/phone within reach Nurse Communication: Mobility status PT Visit Diagnosis: Difficulty in walking, not elsewhere classified (R26.2)    Time: 8676-7209 PT Time Calculation (min) (ACUTE ONLY): 20 min   Charges:   PT Evaluation $PT Eval Low Complexity: [place], PT  [name] 05/08/2019, 3:45 PM

## 2019-05-08 NOTE — Progress Notes (Signed)
PROGRESS NOTE    Mark Frey  CZY:[id number] DOB: 1956-02-05 DOA: 05/04/2019 PCP: [name], MD      Brief Narrative:  Mark Frey is a 63 y.o. 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  In ER, he was tachycardic and febrile.  CT abdomen and pelvis showed colitis and abscess in the LLQ.  ANC <100.  He was started on empiric antibiotics with Cefepime and Flagyl.     Assessment & Plan:  Febrile neutropenia Abdominal abscess Sepsis from E coli bacteremia   Presented with fever, tachycardia, neutropenia.  CT showed left sided abscess.  Started on cefepime, Flagyl.  Blood culture 1/2 growing E coli.  Since then, afebrile.  Symptoms improving steadily, comfortable today, advancing diet. ANC 300 today on Granix.  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 -Consult Hematology, appreciate cares -Continue Granix -Continue Decadron -Continue dronabinol -Trend CBC/diff  Hx DVT/PE -Continue apixaban  Diabetes Glucoses low normal -Continue SSI corrections  Stage II pressure injury, sacrum, POA  Other medications -Continue citalopram          MDM and disposition: The below labs and imaging studies were reviewed and summarized above.  Med mgmt as above.  The patient was admitted with febrile neutropenia and found to have abdominal abscess, too small for drainage.   His fever has resolved, his pain is improved and WBC is increasing on Granix.  ANC still less than 500/mm3    Will continue IV antibiotics, until [place] >500 consistently, then likely home.  Consult to ID re: duration and route of antibiotics, hopefully prepared for discharge in 2-3 days.         DVT prophylaxis: SCDs Code  Status: FULL Family Communication: Wife      Consultants:   Oncology  Procedures:   None  Antimicrobials:   Vancomycin x1 on 8/21  Cefepime 8/21 >>  Flagyl 8/21 >>   Subjective: Feeling wel.  Appetite normal.  No fever.  No voimting.  No abdominal pain, headache, confusion.       Objective: Vitals:   05/07/19 1345 05/07/19 1500 05/07/19 2120 05/08/19 0535  BP: 96/74 103/69 105/79 102/69  Pulse: 91  73 65  Resp: 16  18 17   Temp: 98.4 F (36.9 C)  97.6 F (36.4 C) 97.7 F (36.5 C)  TempSrc: Oral  Oral Oral  SpO2: 93%  99% 98%    Intake/Output Summary (Last 24 hours) at 05/08/2019 1206 Last data filed at 05/08/2019 1100 Gross per 24 hour  Intake 2264.25 ml  Output 3300 ml  Net -1035.75 ml   There were no vitals filed for this visit.  Examination: BP 102/69 (BP Location: Left Arm)   Pulse 65   Temp 97.7 F (36.5 C) (Oral)   Resp 17   SpO2 98%   General: alert and in no distress.  Responds appropriately to questions.  Eye contact, dress and hygiene appropriate. HEENT: Corneas clear, conjunctivae and sclerae normal without injection or icterus, lids and lashes normal.  Visual tracking smooth.     Cardiac: RRR, nl S1-S2, no murmurs, rubs, gallops.  No LE edema   Respiratory: Normal respiratory rate and rhythm.  CTAB without rales or wheezes. Abdomen:   Minimal left sided TTP.  No guarding anywhere.  No  rebound.   No distension, ascites. Extremities: No deformities/injuries.  5/5 grip strength and upper extremity flexion/extension, symmetrically.  Extremities are warm and well-perfused. Neuro: Sensorium intact.  Speech is fluent.  Naming is grossly intact, and the patient's recall, recent and remote, as well as general fund of knowledge seem within normal limits.  Muscle tone normal, without fasciculations.  Moves all extremities equally and with normal coordination.  Attention span and concentration are within normal limits.  Psych: Mood "normal", full range of  affect.  Normal rate and rhythm of speech.  Thought content appropriate, and thought process linear.  No SI/HI, aural or visual hallucinations or delusions. Attention and concentration are normal.       Data Reviewed: I have personally reviewed following labs and imaging studies:  CBC: Recent Labs  Lab 05/04/19 1617 05/05/19 0500 05/05/19 2128 05/06/19 0425 05/07/19 0452 05/08/19 0347  WBC 0.6* 0.8*  --  0.8* 1.2* 1.5*  NEUTROABS  --   --   --   --  0.1* 0.3*  HGB 8.1* 6.8* 8.8* 9.1* 9.3* 9.7*  HCT 25.8* 22.3* 28.1* 28.7* 29.6* 31.5*  MCV 104.5* 107.7*  --  103.6* 103.5* 105.0*  PLT 157 138*  --  136* 141* 161   Basic Metabolic Panel: Recent Labs  Lab 05/04/19 1617 05/05/19 0500 05/08/19 0347  NA 134* 135 138  K 3.4* 3.1* 3.4*  CL 98 102 107  CO2 25 24 25   GLUCOSE 236* 110* 88  BUN 10 9 7*  CREATININE 0.52* 0.39* 0.40*  CALCIUM 9.1 8.4* 8.6*   GFR: Estimated Creatinine Clearance: 95.7 mL/min (A) (by C-G formula based on SCr of 0.4 mg/dL (L)). Liver Function Tests: Recent Labs  Lab 05/04/19 1617 05/05/19 0500 05/08/19 0347  AST 25 17 10*  ALT 58* 45* 18  ALKPHOS 96 74 65  BILITOT 0.5 0.6 0.5  PROT 6.6 5.9* 5.6*  ALBUMIN 2.8* 2.5* 2.5*   Recent Labs  Lab 05/04/19 1617  LIPASE 21   No results for input(s): AMMONIA in the last 168 hours. Coagulation Profile: No results for input(s): INR, PROTIME in the last 168 hours. Cardiac Enzymes: No results for input(s): CKTOTAL, CKMB, CKMBINDEX, TROPONINI in the last 168 hours. BNP (last 3 results) No results for input(s): PROBNP in the last 8760 hours. HbA1C: No results for input(s): HGBA1C in the last 72 hours. CBG: Recent Labs  Lab 05/07/19 1207 05/07/19 1645 05/07/19 2143 05/08/19 0716 05/08/19 1138  GLUCAP 131* 122* 118* 81 170*   Lipid Profile: No results for input(s): CHOL, HDL, LDLCALC, TRIG, CHOLHDL, LDLDIRECT in the last 72 hours. Thyroid Function Tests: No results for input(s): TSH, T4TOTAL,  FREET4, T3FREE, THYROIDAB in the last 72 hours. Anemia Panel: Recent Labs    05/05/19 2128 05/07/19 0452  VITAMINB12 320  --   FOLATE 19.8  --   FERRITIN 2,764*  --   TIBC 157*  --   IRON 57  --   RETICCTPCT  --  1.6   Urine analysis:    Component Value Date/Time   COLORURINE AMBER (A) 05/04/2019 1730   APPEARANCEUR HAZY (A) 05/04/2019 1730   LABSPEC 1.027 05/04/2019 1730   PHURINE 5.0 05/04/2019 1730   GLUCOSEU 50 (A) 05/04/2019 1730   HGBUR NEGATIVE 05/04/2019 1730   HGBUR negative 01/20/2009 0840   BILIRUBINUR NEGATIVE 05/04/2019 1730   BILIRUBINUR Small 01/30/2018 1652   KETONESUR NEGATIVE 05/04/2019 1730   PROTEINUR 30 (A) 05/04/2019 1730   UROBILINOGEN 2.0 (A) 01/30/2018 1652  UROBILINOGEN 0.2 01/20/2009 0840   NITRITE NEGATIVE 05/04/2019 1730   LEUKOCYTESUR NEGATIVE 05/04/2019 1730   Sepsis Labs: @LABRCNTIP (procalcitonin:4,lacticacidven:4)  ) Recent Results (from the past 240 hour(s))  Blood Culture (routine x 2)     Status: Abnormal   Collection Time: 05/04/19  5:29 PM   Specimen: BLOOD  Result Value Ref Range Status   Specimen Description   Final    BLOOD PORTA CATH Performed at [facility] [address] [place], [place] [address]    Special Requests   Final    BOTTLES DRAWN AEROBIC AND ANAEROBIC Blood Culture adequate volume Performed at [facility] [address], [place], [place] [address]    Culture  Setup Time   Final    GRAM NEGATIVE RODS AEROBIC BOTTLE ONLY CRITICAL RESULT CALLED TO, READ BACK BY AND VERIFIED WITH: PHARMD J LEGGE [id number] AT 1158 AM BY CM Performed at [facility], [place] [address], [place], [place] [address]    Culture ESCHERICHIA COLI (A)  Final   Report Status 05/08/2019 FINAL  Final   Organism ID, Bacteria ESCHERICHIA COLI  Final      Susceptibility   Escherichia coli - MIC*    AMPICILLIN <=2 SENSITIVE Sensitive     CEFAZOLIN <=4 SENSITIVE Sensitive     CEFEPIME <=1  SENSITIVE Sensitive     CEFTAZIDIME <=1 SENSITIVE Sensitive     CEFTRIAXONE <=1 SENSITIVE Sensitive     CIPROFLOXACIN <=0.25 SENSITIVE Sensitive     GENTAMICIN <=1 SENSITIVE Sensitive     IMIPENEM <=0.25 SENSITIVE Sensitive     TRIMETH/SULFA <=20 SENSITIVE Sensitive     AMPICILLIN/SULBACTAM <=2 SENSITIVE Sensitive     PIP/TAZO <=4 SENSITIVE Sensitive     Extended ESBL NEGATIVE Sensitive     * ESCHERICHIA COLI  SARS CORONAVIRUS 2 Nasal Swab Aptima Multi Swab     Status: None   Collection Time: 05/04/19  5:29 PM   Specimen: Aptima Multi Swab; Nasal Swab  Result Value Ref Range Status   SARS Coronavirus 2 NEGATIVE NEGATIVE Final    Comment: (NOTE) SARS-CoV-2 target nucleic acids are NOT DETECTED. The SARS-CoV-2 RNA is generally detectable in upper and lower respiratory specimens during the acute phase of infection. Negative results do not preclude SARS-CoV-2 infection, do not rule out co-infections with other pathogens, and should not be used as the sole basis for treatment or other patient management decisions. Negative results must be combined with clinical observations, patient history, and epidemiological information. The expected result is Negative. Fact Sheet for Patients: SugarRoll.be Fact Sheet for Healthcare Providers: https://www.woods-mathews.com/ This test is not yet approved or cleared by the [place] FDA and  has been authorized for detection and/or diagnosis of SARS-CoV-2 by FDA under an Emergency Use Authorization (EUA). This EUA will remain  in effect (meaning this test can be used) for the duration of the COVID-19 declaration under Section 56 4(b)(1) of the Act, 21 U.S.C. section 360bbb-3(b)(1), unless the authorization is terminated or revoked sooner. Performed at [facility], [place] [address] [place], [place] [address]   Blood Culture ID Panel (Reflexed)     Status: Abnormal   Collection Time: 05/04/19  5:29  PM  Result Value Ref Range Status   Enterococcus species NOT DETECTED NOT DETECTED Final   Listeria monocytogenes NOT DETECTED NOT DETECTED Final   Staphylococcus species NOT DETECTED NOT DETECTED Final   Staphylococcus aureus (BCID) NOT DETECTED NOT DETECTED Final   Streptococcus species NOT  DETECTED NOT DETECTED Final   Streptococcus agalactiae NOT DETECTED NOT DETECTED Final   Streptococcus pneumoniae NOT DETECTED NOT DETECTED Final   Streptococcus pyogenes NOT DETECTED NOT DETECTED Final   Acinetobacter baumannii NOT DETECTED NOT DETECTED Final   Enterobacteriaceae species DETECTED (A) NOT DETECTED Final    Comment: Enterobacteriaceae represent a large family of gram-negative bacteria, not a single organism. CRITICAL RESULT CALLED TO, READ BACK BY AND VERIFIED WITH: PHARMD J LEGGE [id number] AT 1158 AM BY CM    Enterobacter cloacae complex NOT DETECTED NOT DETECTED Final   Escherichia coli DETECTED (A) NOT DETECTED Final    Comment: CRITICAL RESULT CALLED TO, READ BACK BY AND VERIFIED WITH: PHARMD J LEGGE [id number] AT 6599 AM BY CM    Klebsiella oxytoca NOT DETECTED NOT DETECTED Final   Klebsiella pneumoniae NOT DETECTED NOT DETECTED Final   Proteus species NOT DETECTED NOT DETECTED Final   Serratia marcescens NOT DETECTED NOT DETECTED Final   Carbapenem resistance NOT DETECTED NOT DETECTED Final   Haemophilus influenzae NOT DETECTED NOT DETECTED Final   Neisseria meningitidis NOT DETECTED NOT DETECTED Final   Pseudomonas aeruginosa NOT DETECTED NOT DETECTED Final   Candida albicans NOT DETECTED NOT DETECTED Final   Candida glabrata NOT DETECTED NOT DETECTED Final   Candida krusei NOT DETECTED NOT DETECTED Final   Candida parapsilosis NOT DETECTED NOT DETECTED Final   Candida tropicalis NOT DETECTED NOT DETECTED Final    Comment: Performed at [facility], [place]. [address] [place], [place] [address]  Blood Culture (routine x 2)     Status: None (Preliminary result)   Collection  Time: 05/04/19  5:47 PM   Specimen: BLOOD  Result Value Ref Range Status   Specimen Description   Final    BLOOD RIGHT WRIST Performed at [facility] [address], [place], [place] [address]    Special Requests   Final    BOTTLES DRAWN AEROBIC AND ANAEROBIC Blood Culture adequate volume Performed at [facility] [address] [place], [place] [address]    Culture   Final    NO GROWTH 4 DAYS Performed at [facility], [place] [address] [place], [place] [address]    Report Status PENDING  Incomplete         Radiology Studies: No results found.      Scheduled Meds: . apixaban  5 mg Oral BID  . citalopram  20 mg Oral Daily  . dexamethasone  1 mg Oral q morning - 10a  . dronabinol  5 mg Oral Daily  . insulin aspart  0-5 Units Subcutaneous QHS  . insulin aspart  0-9 Units Subcutaneous TID WC  . magnesium oxide  400 mg Oral BID  . Tbo-Filgrastim  480 mcg Subcutaneous 1 day or 1 dose  . zinc sulfate  220 mg Oral BID   Continuous Infusions: . ceFEPime (MAXIPIME) IV 2 g (05/08/19 0945)  . metronidazole 500 mg (05/08/19 0945)     LOS: 4 days    Time spent: 25 minutes    Edwin Dada, MD [facility] 05/08/2019, 12:06 PM     Please page through [facility]:  [email or web address] Password [email or web address] If 7PM-7AM, please contact night-coverage

## 2019-05-08 NOTE — Consult Note (Signed)
[facility] for Infectious Disease  Total days of antibiotics 5/ cefepime and metronidazole               Reason for Consult: intra-abdominal abscess   Referring Physician: danford  Principal Problem:   Intra-abdominal abscess (Mark Frey) Active Problems:   Hyperlipidemia   Irritable bowel syndrome   Controlled diabetes mellitus type II without complication (HCC)   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HCC)   Pancytopenia (HCC)   Neutropenia (HCC)   Depression, major, single episode, complete remission (HCC)    HPI: Mark Frey is a 63 y.o. male with hx of burkitt's lymphoma in remission, status post radiation and chemotherapy, s/p ileostomy, diabetes mellitus, type 2, radiation colitis, who presents on 8/21 with 2 day history of fever and left lower quadrant pain. He denies more output in his ostomy, no n/v, he feels that it was similar to situation in the past.  He was to have intra-abdominal abscess plus ecoli bacteremia.(pansensitive) - from portacath but peripheral cx was negative. He was hospitalized in June 2020 - for intra-abdominal infection with polymicrobial infection  Past Medical History:  Diagnosis Date  . ALLERGIC RHINITIS   . Cancer ([place])    Lymphoma   . Diabetes mellitus   . Hyperlipidemia     Allergies:  Allergies  Allergen Reactions  . Ciprofloxacin Other (See Comments)    Leg tingling     MEDICATIONS: . apixaban  5 mg Oral BID  . citalopram  20 mg Oral Daily  . dexamethasone  1 mg Oral q morning - 10a  . dronabinol  5 mg Oral Daily  . insulin aspart  0-5 Units Subcutaneous QHS  . insulin aspart  0-9 Units Subcutaneous TID WC  . magnesium oxide  400 mg Oral BID  . Tbo-Filgrastim  480 mcg Subcutaneous 1 day or 1 dose  . zinc sulfate  220 mg Oral BID    Social History   Tobacco Use  . Smoking status: Never Smoker  . Smokeless tobacco: Never Used  Substance Use Topics  . Alcohol use: Yes    Comment: occasional  . Drug use: No    Family History   Problem Relation Age of Onset  . Lung cancer Mother        smoker  . Brain cancer Mother        metastasis  . AAA (abdominal aortic aneurysm) Father        smoker     Review of Systems  Constitutional: + for fever, chills, diaphoresis, activity change, appetite change, fatigue and unexpected weight change.  HENT: Negative for congestion, sore throat, rhinorrhea, sneezing, trouble swallowing and sinus pressure.  Eyes: Negative for photophobia and visual disturbance.  Respiratory: Negative for cough, chest tightness, shortness of breath, wheezing and stridor.  Cardiovascular: Negative for chest pain, palpitations and leg swelling.  Gastrointestinal: + abd pain. Negative for nausea, vomiting,, diarrhea, constipation, blood in stool, abdominal distention and anal bleeding.  Genitourinary: Negative for dysuria, hematuria, flank pain and difficulty urinating.  Musculoskeletal: Negative for myalgias, back pain, joint swelling, arthralgias and gait problem.  Skin: Negative for color change, pallor, rash and wound.  Neurological: Negative for dizziness, tremors, weakness and light-headedness.  Hematological: Negative for adenopathy. Does not bruise/bleed easily.  Psychiatric/Behavioral: Negative for behavioral problems, confusion, sleep disturbance, dysphoric mood, decreased concentration and agitation.     OBJECTIVE: Temp:  [97.5 F (36.4 C)-97.7 F (36.5 C)] 97.5 F (36.4 C) (08/25 1408) Pulse Rate:  [65-91] 91 (  08/25 1408) Resp:  [16-18] 16 (08/25 1408) BP: (102-105)/(68-79) 104/68 (08/25 1408) SpO2:  [98 %-99 %] 98 % (08/25 1408) Physical Exam  Constitutional: He is oriented to person, place, and time. He appears well-developed and well-nourished. No distress.  HENT:  Mouth/Throat: Oropharynx is clear and moist. No oropharyngeal exudate.  Cardiovascular: Normal rate, regular rhythm and normal heart sounds. Exam reveals no gallop and no friction rub.  No murmur heard.   Pulmonary/Chest: Effort normal and breath sounds normal. No respiratory distress. He has no wheezes.  Abdominal: Soft. Bowel sounds are normal. He exhibits no distension. Right sided ostomy. LLQ tenderness.  Lymphadenopathy:  He has no cervical adenopathy.  Neurological: He is alert and oriented to person, place, and time.  Skin: Skin is warm and dry. No rash noted. No erythema.  Psychiatric: He has a normal mood and affect. His behavior is normal.     LABS: Results for orders placed or performed during the hospital encounter of 05/04/19 (from the past 48 hour(s))  Glucose, capillary     Status: Abnormal   Collection Time: 05/06/19  4:26 PM  Result Value Ref Range   Glucose-Capillary 135 (H) 70 - 99 mg/dL  Glucose, capillary     Status: Abnormal   Collection Time: 05/06/19  9:20 PM  Result Value Ref Range   Glucose-Capillary 103 (H) 70 - 99 mg/dL  CBC with Differential     Status: Abnormal   Collection Time: 05/07/19  4:52 AM  Result Value Ref Range   WBC 1.2 (LL) 4.0 - 10.5 K/uL    Comment: CRITICAL VALUE NOTED.  VALUE IS CONSISTENT WITH PREVIOUSLY REPORTED AND CALLED VALUE.   RBC 2.86 (L) 4.22 - 5.81 MIL/uL   Hemoglobin 9.3 (L) 13.0 - 17.0 g/dL   HCT 29.6 (L) 39.0 - 52.0 %   MCV 103.5 (H) 80.0 - 100.0 fL   MCH 32.5 26.0 - 34.0 pg   MCHC 31.4 30.0 - 36.0 g/dL   RDW 16.9 (H) 11.5 - 15.5 %   Platelets 141 (L) 150 - 400 K/uL   nRBC 0.0 0.0 - 0.2 %   Neutrophils Relative % 12 %   Neutro Abs 0.1 (L) 1.7 - 7.7 K/uL   Lymphocytes Relative 51 %   Lymphs Abs 0.6 (L) 0.7 - 4.0 K/uL   Monocytes Relative 34 %   Monocytes Absolute 0.4 0.1 - 1.0 K/uL   Eosinophils Relative 1 %   Eosinophils Absolute 0.0 0.0 - 0.5 K/uL   Basophils Relative 1 %   Basophils Absolute 0.0 0.0 - 0.1 K/uL   WBC Morphology DOHLE BODIES     Comment: TOXIC GRANULATION   Immature Granulocytes 1 %   Abs Immature Granulocytes 0.01 0.00 - 0.07 K/uL    Comment: Performed at [facility] [place] [address] [place], [place] [address]  Reticulocytes     Status: Abnormal   Collection Time: 05/07/19  4:52 AM  Result Value Ref Range   Retic Ct Pct 1.6 0.4 - 3.1 %   RBC. 2.86 (L) 4.22 - 5.81 MIL/uL   Retic Count, Absolute 46.3 19.0 - 186.0 K/uL   Immature Retic Fract 15.7 2.3 - 15.9 %    Comment: Performed at [facility], [place] [address] [place], [place] [address]  Glucose, capillary     Status: None   Collection Time: 05/07/19  7:22 AM  Result Value Ref Range   Glucose-Capillary 81 70 - 99 mg/dL  Glucose, capillary  Status: Abnormal   Collection Time: 05/07/19 12:07 PM  Result Value Ref Range   Glucose-Capillary 131 (H) 70 - 99 mg/dL  Glucose, capillary     Status: Abnormal   Collection Time: 05/07/19  4:45 PM  Result Value Ref Range   Glucose-Capillary 122 (H) 70 - 99 mg/dL  Glucose, capillary     Status: Abnormal   Collection Time: 05/07/19  9:43 PM  Result Value Ref Range   Glucose-Capillary 118 (H) 70 - 99 mg/dL  CBC with Differential     Status: Abnormal   Collection Time: 05/08/19  3:47 AM  Result Value Ref Range   WBC 1.5 (L) 4.0 - 10.5 K/uL   RBC 3.00 (L) 4.22 - 5.81 MIL/uL   Hemoglobin 9.7 (L) 13.0 - 17.0 g/dL   HCT 31.5 (L) 39.0 - 52.0 %   MCV 105.0 (H) 80.0 - 100.0 fL   MCH 32.3 26.0 - 34.0 pg   MCHC 30.8 30.0 - 36.0 g/dL   RDW 16.6 (H) 11.5 - 15.5 %   Platelets 150 150 - 400 K/uL   nRBC 0.0 0.0 - 0.2 %   Neutrophils Relative % 23 %   Neutro Abs 0.3 (L) 1.7 - 7.7 K/uL   Lymphocytes Relative 48 %   Lymphs Abs 0.7 0.7 - 4.0 K/uL   Monocytes Relative 27 %   Monocytes Absolute 0.4 0.1 - 1.0 K/uL   Eosinophils Relative 1 %   Eosinophils Absolute 0.0 0.0 - 0.5 K/uL   Basophils Relative 1 %   Basophils Absolute 0.0 0.0 - 0.1 K/uL   WBC Morphology DOHLE BODIES     Comment: TOXIC GRANULATION   Immature Granulocytes 0 %   Abs Immature Granulocytes 0.00 0.00 - 0.07 K/uL    Comment: Performed at [facility], [place]  [address] [place], [place] [address]  Comprehensive metabolic panel     Status: Abnormal   Collection Time: 05/08/19  3:47 AM  Result Value Ref Range   Sodium 138 135 - 145 mmol/L   Potassium 3.4 (L) 3.5 - 5.1 mmol/L   Chloride 107 98 - 111 mmol/L   CO2 25 22 - 32 mmol/L   Glucose, Bld 88 70 - 99 mg/dL   BUN 7 (L) 8 - 23 mg/dL   Creatinine, Ser 0.40 (L) 0.61 - 1.24 mg/dL   Calcium 8.6 (L) 8.9 - 10.3 mg/dL   Total Protein 5.6 (L) 6.5 - 8.1 g/dL   Albumin 2.5 (L) 3.5 - 5.0 g/dL   AST 10 (L) 15 - 41 U/L   ALT 18 0 - 44 U/L   Alkaline Phosphatase 65 38 - 126 U/L   Total Bilirubin 0.5 0.3 - 1.2 mg/dL   GFR calc non Af Amer >60 >60 mL/min   GFR calc Af Amer >60 >60 mL/min   Anion gap 6 5 - 15    Comment: Performed at [facility], [place] [address] [place], [place] [address]  Glucose, capillary     Status: None   Collection Time: 05/08/19  7:16 AM  Result Value Ref Range   Glucose-Capillary 81 70 - 99 mg/dL  Glucose, capillary     Status: Abnormal   Collection Time: 05/08/19 11:38 AM  Result Value Ref Range   Glucose-Capillary 170 (H) 70 - 99 mg/dL    MICRO: 8/21 portacath - ecoli 8/21 blood cx - ecoli  IMAGING: Stomach/Bowel: Stomach is unremarkable. RIGHT LOWER QUADRANT loop ileostomy is nonobstructing. There continues to be significant  tethering and inflammatory change in the central pelvis and LEFT LOWER QUADRANT, extending to the LEFT pelvic sidewall. The cecum and sigmoid colon appear to be adherent. There are coarse calcifications in the region of the soft tissue between the cecum and sigmoid colon.  There is persistent tethering of the descending colon with the LEFT LATERAL abdominal wall. There is persistent focal low-attenuation within the LEFT LATERAL peritoneal reflection, consistent with abscess, now measuring 2.7 x 1.7 centimeters. This is best seen on image 35 of series 7. There is associated subcutaneous edema overlying the LEFT LATERAL  abdominal wall.   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 - would recommend to repeat blood cx - await for ANC > 2000 x 2 days before finalizing abtx choice. - likely need 2-3 wk of abtx with repeat imaging to see that it is improved.  Neutropenia = improving with GCSF

## 2019-05-08 NOTE — Progress Notes (Signed)
Brief Oncology Note:  CBC from today was reviewed.  Total white blood cell count and ANC are slowly increasing.  ANC is 0.3 today.  Recommend daily CBC with differential and for the patient continue on Granix 480 mcg daily until [place] is 1000 or higher x2 days.  We will continue to follow his lab work daily.  Mikey Bussing, DNP, AGPCNP-BC, AOCNP

## 2019-05-09 ENCOUNTER — Inpatient Hospital Stay (HOSPITAL_COMMUNITY): Payer: BC Managed Care – PPO

## 2019-05-09 DIAGNOSIS — L0291 Cutaneous abscess, unspecified: Secondary | ICD-10-CM

## 2019-05-09 DIAGNOSIS — E119 Type 2 diabetes mellitus without complications: Secondary | ICD-10-CM

## 2019-05-09 LAB — COMPREHENSIVE METABOLIC PANEL
ALT: 19 U/L (ref 0–44)
AST: 13 U/L — ABNORMAL LOW (ref 15–41)
Albumin: 2.5 g/dL — ABNORMAL LOW (ref 3.5–5.0)
Alkaline Phosphatase: 63 U/L (ref 38–126)
Anion gap: 6 (ref 5–15)
BUN: 10 mg/dL (ref 8–23)
CO2: 25 mmol/L (ref 22–32)
Calcium: 8.5 mg/dL — ABNORMAL LOW (ref 8.9–10.3)
Chloride: 106 mmol/L (ref 98–111)
Creatinine, Ser: 0.46 mg/dL — ABNORMAL LOW (ref 0.61–1.24)
GFR calc Af Amer: >60 mL/min (ref >60)
GFR calc non Af Amer: >60 mL/min (ref >60)
Glucose, Bld: 106 mg/dL — ABNORMAL HIGH (ref 70–99)
Potassium: 3.6 mmol/L (ref 3.5–5.1)
Sodium: 137 mmol/L (ref 135–145)
Total Bilirubin: 0.8 mg/dL (ref 0.3–1.2)
Total Protein: 5.5 g/dL — ABNORMAL LOW (ref 6.5–8.1)

## 2019-05-09 LAB — CBC WITH DIFFERENTIAL/PLATELET
Abs Immature Granulocytes: 0.05 10*3/uL (ref 0.00–0.07)
Basophils Absolute: 0 10*3/uL (ref 0.0–0.1)
Basophils Relative: 0 %
Eosinophils Absolute: 0 10*3/uL (ref 0.0–0.5)
Eosinophils Relative: 0 %
HCT: 31.6 % — ABNORMAL LOW (ref 39.0–52.0)
Hemoglobin: 9.7 g/dL — ABNORMAL LOW (ref 13.0–17.0)
Immature Granulocytes: 2 %
Lymphocytes Relative: 32 %
Lymphs Abs: 0.8 10*3/uL (ref 0.7–4.0)
MCH: 32.1 pg (ref 26.0–34.0)
MCHC: 30.7 g/dL (ref 30.0–36.0)
MCV: 104.6 fL — ABNORMAL HIGH (ref 80.0–100.0)
Monocytes Absolute: 0.7 10*3/uL (ref 0.1–1.0)
Monocytes Relative: 29 %
Neutro Abs: 0.9 10*3/uL — ABNORMAL LOW (ref 1.7–7.7)
Neutrophils Relative %: 37 %
Platelets: 145 10*3/uL — ABNORMAL LOW (ref 150–400)
RBC: 3.02 MIL/uL — ABNORMAL LOW (ref 4.22–5.81)
RDW: 16.7 % — ABNORMAL HIGH (ref 11.5–15.5)
WBC: 2.4 10*3/uL — ABNORMAL LOW (ref 4.0–10.5)
nRBC: 0 % (ref 0.0–0.2)

## 2019-05-09 LAB — CULTURE, BLOOD (ROUTINE X 2)
Culture: NO GROWTH
Special Requests: ADEQUATE

## 2019-05-09 LAB — GLUCOSE, CAPILLARY
Glucose-Capillary: 83 mg/dL (ref 70–99)
Glucose-Capillary: 139 mg/dL — ABNORMAL HIGH (ref 70–99)
Glucose-Capillary: 187 mg/dL — ABNORMAL HIGH (ref 70–99)
Glucose-Capillary: 152 mg/dL — ABNORMAL HIGH (ref 70–99)

## 2019-05-09 MED ORDER — IOHEXOL 300 MG/ML  SOLN
100.0000 mL | Freq: Once | INTRAMUSCULAR | Status: AC | PRN
  Administered 2019-05-09: 100 mL via INTRAVENOUS

## 2019-05-09 NOTE — Progress Notes (Signed)
[facility]                                                                              Patient Demographics  Mark Frey, is a 63 y.o. male, DOB - [birth year], KYH:[id number]  Admit date - 05/04/2019   Admitting Physician Elwyn Reach, MD  Outpatient Primary MD for the patient is [name], MD  Outpatient specialists:   LOS - 5  days   Medical records reviewed and are as summarized below:    Chief Complaint  Patient presents with   Abscess   Fever       Brief summary   Mark Frey is a 63 y.o. 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 In ER, he was tachycardic and febrile.  CT abdomen and pelvis showed colitis and abscess in the LLQ.  ANC <100.  He was started on empiric antibiotics with Cefepime and Flagyl.   Assessment & Plan    Principal Problem:   Intra-abdominal abscess ([place]), sepsis from E. coli bacteremia -Patient presented with fevers, tachycardia, neutropenia.  CT abdomen showed left-sided abscess, patient was started on cefepime, Flagyl. -Blood cultures grew E. coli. -CT abdomen pelvis showed persistent teetering of the descending colon with left lateral abdominal wall, associated with abscess in the left lateral peritoneal reflection measuring 2.7x 1.7 cm -ID has been consulted, patient on IV antibiotics, repeat blood cultures today -Repeat CT abdomen pelvis, If no significant improvement in the abscess, may need drainage with IR -ID following, appreciate Dr. Crissie Figures recommendation  Active Problems: Febrile neutropenia, leukopenia WBC count improving, received Granix   History of Burkitt's lymphoma in remission -Oncology consulted, appreciate recommendations, continue Granix, Decadron, dronabinol  History of PE, DVT Continue apixaban  Diabetes mellitus type 2 Continue sliding scale  insulin  Stage II pressure injury, sacrum -Present on admission, continue with wound care   Code Status: Full code DVT Prophylaxis:   SCD's Family Communication: Discussed all imaging results, lab results, explained to the patient    Disposition Plan: Await repeat blood cultures, repeat CT imaging, will remain inpatient until clinical improvement  Time Spent in minutes 35 minutes  Procedures:  None  Consultants:   Infectious disease Oncology  Antimicrobials:   Anti-infectives (From admission, onward)   Start     Dose/Rate Route Frequency Ordered Stop   05/05/19 0200  metroNIDAZOLE (FLAGYL) IVPB 500 mg     500 mg 100 mL/hr over 60 Minutes Intravenous Every 8 hours 05/04/19 2109     05/05/19 0200  ceFEPIme (MAXIPIME) 2 g in sodium chloride 0.9 % 100 mL IVPB     2 g 200 mL/hr over 30 Minutes Intravenous Every 8 hours 05/04/19 2108     05/04/19 1730  ceFEPIme (MAXIPIME) 2 g in sodium chloride 0.9 % 100 mL IVPB     2 g 200 mL/hr over 30 Minutes Intravenous  Once 05/04/19 1716 05/04/19 1853   05/04/19 1730  metroNIDAZOLE (FLAGYL) IVPB 500 mg     500 mg 100 mL/hr over 60 Minutes  Intravenous  Once 05/04/19 1716 05/04/19 1926   05/04/19 1730  vancomycin (VANCOCIN) IVPB 1000 mg/200 mL premix     1,000 mg 200 mL/hr over 60 Minutes Intravenous  Once 05/04/19 1716 05/04/19 1919          Medications  Scheduled Meds:  apixaban  5 mg Oral BID   citalopram  20 mg Oral Daily   dexamethasone  1 mg Oral q morning - 10a   dronabinol  5 mg Oral Daily   insulin aspart  0-5 Units Subcutaneous QHS   insulin aspart  0-9 Units Subcutaneous TID WC   magnesium oxide  400 mg Oral BID   Tbo-Filgrastim  480 mcg Subcutaneous 1 day or 1 dose   zinc sulfate  220 mg Oral BID   Continuous Infusions:  ceFEPime (MAXIPIME) IV 2 g (05/09/19 1044)   metronidazole 500 mg (05/09/19 1000)   PRN Meds:.acetaminophen, HYDROcodone-acetaminophen, ondansetron **OR** ondansetron (ZOFRAN)  IV      Subjective:   Mark Frey was seen and examined today.  No acute complaints, eating food at the time of my examination.  No nausea or vomiting.  Overall feels improving.  Patient denies dizziness, chest pain, shortness of breath,  new weakness, numbess, tingling. No acute events overnight.    Objective:   Vitals:   05/08/19 2132 05/09/19 0454 05/09/19 0630 05/09/19 1344  BP: 105/75 103/71  98/62  Pulse: 70 61  96  Resp: 18 18  17   Temp: 97.9 F (36.6 C) 97.8 F (36.6 C)  (!) 97.3 F (36.3 C)  TempSrc: Oral Oral  Oral  SpO2: 98% 100%  98%  Weight:   73.9 kg   Height:   5' 9"  (1.753 m)     Intake/Output Summary (Last 24 hours) at 05/09/2019 1434 Last data filed at 05/09/2019 1346 Gross per 24 hour  Intake 1448.78 ml  Output 2025 ml  Net -576.22 ml     Wt Readings from Last 3 Encounters:  05/09/19 73.9 kg  05/04/19 73.9 kg  04/17/19 73.9 kg     Exam  General: Alert and oriented x 3, NAD  Eyes:   HEENT:   Cardiovascular: S1 S2 auscultated, no murmurs, RRR  Respiratory: Clear to auscultation bilaterally, no wheezing, rales or rhonchi  Gastrointestinal: Soft, minimal left-sided TTP, nondistended, + bowel sounds  Ext: no pedal edema bilaterally  Neuro: No new deficits  Musculoskeletal: No digital cyanosis, clubbing  Skin: No rashes  Psych: Normal affect and demeanor, alert and oriented x3    Data Reviewed:  I have personally reviewed following labs and imaging studies  Micro Results Recent Results (from the past 240 hour(s))  Blood Culture (routine x 2)     Status: Abnormal   Collection Time: 05/04/19  5:29 PM   Specimen: BLOOD  Result Value Ref Range Status   Specimen Description   Final    BLOOD PORTA CATH Performed at [facility], [place] [address], [place], [name] [address]    Special Requests   Final    BOTTLES DRAWN AEROBIC AND ANAEROBIC Blood Culture adequate volume Performed at [place] [address] [place], [place] [address]    Culture  Setup Time   Final    GRAM NEGATIVE RODS AEROBIC BOTTLE ONLY CRITICAL RESULT CALLED TO, READ BACK BY AND VERIFIED WITH: PHARMD J LEGGE [id number] AT 1158 AM BY CM Performed at [facility], [place] [address] [place], [place] [address]    Culture  ESCHERICHIA COLI (A)  Final   Report Status 05/08/2019 FINAL  Final   Organism ID, Bacteria ESCHERICHIA COLI  Final      Susceptibility   Escherichia coli - MIC*    AMPICILLIN <=2 SENSITIVE Sensitive     CEFAZOLIN <=4 SENSITIVE Sensitive     CEFEPIME <=1 SENSITIVE Sensitive     CEFTAZIDIME <=1 SENSITIVE Sensitive     CEFTRIAXONE <=1 SENSITIVE Sensitive     CIPROFLOXACIN <=0.25 SENSITIVE Sensitive     GENTAMICIN <=1 SENSITIVE Sensitive     IMIPENEM <=0.25 SENSITIVE Sensitive     TRIMETH/SULFA <=20 SENSITIVE Sensitive     AMPICILLIN/SULBACTAM <=2 SENSITIVE Sensitive     PIP/TAZO <=4 SENSITIVE Sensitive     Extended ESBL NEGATIVE Sensitive     * ESCHERICHIA COLI  SARS CORONAVIRUS 2 Nasal Swab Aptima Multi Swab     Status: None   Collection Time: 05/04/19  5:29 PM   Specimen: Aptima Multi Swab; Nasal Swab  Result Value Ref Range Status   SARS Coronavirus 2 NEGATIVE NEGATIVE Final    Comment: (NOTE) SARS-CoV-2 target nucleic acids are NOT DETECTED. The SARS-CoV-2 RNA is generally detectable in upper and lower respiratory specimens during the acute phase of infection. Negative results do not preclude SARS-CoV-2 infection, do not rule out co-infections with other pathogens, and should not be used as the sole basis for treatment or other patient management decisions. Negative results must be combined with clinical observations, patient history, and epidemiological information. The expected result is Negative. Fact Sheet for Patients: SugarRoll.be Fact Sheet for Healthcare Providers: https://www.woods-mathews.com/ This test is not yet approved  or cleared by the [place] FDA and  has been authorized for detection and/or diagnosis of SARS-CoV-2 by FDA under an Emergency Use Authorization (EUA). This EUA will remain  in effect (meaning this test can be used) for the duration of the COVID-19 declaration under Section 56 4(b)(1) of the Act, 21 U.S.C. section 360bbb-3(b)(1), unless the authorization is terminated or revoked sooner. Performed at [facility], [place] [address] [place], [place] [address]   Blood Culture ID Panel (Reflexed)     Status: Abnormal   Collection Time: 05/04/19  5:29 PM  Result Value Ref Range Status   Enterococcus species NOT DETECTED NOT DETECTED Final   Listeria monocytogenes NOT DETECTED NOT DETECTED Final   Staphylococcus species NOT DETECTED NOT DETECTED Final   Staphylococcus aureus (BCID) NOT DETECTED NOT DETECTED Final   Streptococcus species NOT DETECTED NOT DETECTED Final   Streptococcus agalactiae NOT DETECTED NOT DETECTED Final   Streptococcus pneumoniae NOT DETECTED NOT DETECTED Final   Streptococcus pyogenes NOT DETECTED NOT DETECTED Final   Acinetobacter baumannii NOT DETECTED NOT DETECTED Final   Enterobacteriaceae species DETECTED (A) NOT DETECTED Final    Comment: Enterobacteriaceae represent a large family of gram-negative bacteria, not a single organism. CRITICAL RESULT CALLED TO, READ BACK BY AND VERIFIED WITH: PHARMD J LEGGE [id number] AT 1158 AM BY CM    Enterobacter cloacae complex NOT DETECTED NOT DETECTED Final   Escherichia coli DETECTED (A) NOT DETECTED Final    Comment: CRITICAL RESULT CALLED TO, READ BACK BY AND VERIFIED WITH: PHARMD J LEGGE [id number] AT 7846 AM BY CM    Klebsiella oxytoca NOT DETECTED NOT DETECTED Final   Klebsiella pneumoniae NOT DETECTED NOT DETECTED Final   Proteus species NOT DETECTED NOT DETECTED Final   Serratia marcescens NOT DETECTED NOT DETECTED Final   Carbapenem resistance NOT DETECTED NOT DETECTED Final  Haemophilus influenzae NOT  DETECTED NOT DETECTED Final   Neisseria meningitidis NOT DETECTED NOT DETECTED Final   Pseudomonas aeruginosa NOT DETECTED NOT DETECTED Final   Candida albicans NOT DETECTED NOT DETECTED Final   Candida glabrata NOT DETECTED NOT DETECTED Final   Candida krusei NOT DETECTED NOT DETECTED Final   Candida parapsilosis NOT DETECTED NOT DETECTED Final   Candida tropicalis NOT DETECTED NOT DETECTED Final    Comment: Performed at [facility], [place] [address] [place], [place] [address]  Blood Culture (routine x 2)     Status: None   Collection Time: 05/04/19  5:47 PM   Specimen: BLOOD  Result Value Ref Range Status   Specimen Description   Final    BLOOD RIGHT WRIST Performed at [place] [address] [place], [place] [address]    Special Requests   Final    BOTTLES DRAWN AEROBIC AND ANAEROBIC Blood Culture adequate volume Performed at [place] [address] [place], [place] [address]    Culture   Final    NO GROWTH 5 DAYS Performed at [facility], [place] [address], [place], [place] [address]    Report Status 05/09/2019 FINAL  Final    Radiology Reports Ct Abdomen Pelvis W Contrast  Addendum Date: 05/04/2019   ADDENDUM REPORT: 05/04/2019 19:43 ADDENDUM: The appearance of the LEFT LATERAL abdominal wall abscess is stable compared with most recent comparison, 05/02/2019. The salient findings were discussed with Lacretia Leigh on 05/04/2019 at 7:43 pm. Electronically Signed   By: Nolon Nations M.D.   On: 05/04/2019 19:43   Result Date: 05/04/2019 CLINICAL DATA:  Patient states he was called by his physician to come to the ED for an abscess in his colon. Patient has been having a low grade fever at home patient denies any pain at this time. Follow CT scan from 8/19. Per patient he has back pain. Patient's history of lymphoma and bowel obstruction. EXAM: CT ABDOMEN AND PELVIS WITH CONTRAST TECHNIQUE: Multidetector CT imaging of the  abdomen and pelvis was performed using the standard protocol following bolus administration of intravenous contrast. CONTRAST:  13m OMNIPAQUE IOHEXOL 300 MG/ML  SOLN COMPARISON:  02/27/2019, 05/02/2019, and multiple prior studies FINDINGS: Lower chest: Subsegmental atelectasis at the lung bases. Hepatobiliary: Small cysts are identified. Gallbladder is present. Note is made of a Phrygian cap. No evidence for acute cholecystitis. Pancreas: Unremarkable. No pancreatic ductal dilatation or surrounding inflammatory changes. Spleen: Normal in size without focal abnormality. Adrenals/Urinary Tract: Normal adrenal glands. Small bilateral renal cysts. Intrarenal calculus in the RIGHT kidney measures 7 millimeters. Punctate calcification is identified in the UPPER pole of the LEFT kidney, 2 millimeters in diameter. No solid renal mass or hydronephrosis. The ureters are unremarkable. Stomach/Bowel: Stomach is unremarkable. RIGHT LOWER QUADRANT loop ileostomy is nonobstructing. There continues to be significant tethering and inflammatory change in the central pelvis and LEFT LOWER QUADRANT, extending to the LEFT pelvic sidewall. The cecum and sigmoid colon appear to be adherent. There are coarse calcifications in the region of the soft tissue between the cecum and sigmoid colon. There is persistent tethering of the descending colon with the LEFT LATERAL abdominal wall. There is persistent focal low-attenuation within the LEFT LATERAL peritoneal reflection, consistent with abscess, now measuring 2.7 x 1.7 centimeters. This is best seen on image 35 of series 7. There is associated subcutaneous edema overlying the LEFT LATERAL abdominal wall. Vascular/Lymphatic: There is atherosclerotic calcification of the abdominal aorta.  No aneurysm. No retroperitoneal or mesenteric adenopathy. Reproductive: Prostate is unremarkable. Other: None Musculoskeletal: Degenerative changes are seen in the LOWER thoracic and lumbar spine. No  suspicious lytic or blastic lesions are identified. IMPRESSION: 1. Persistent tethering of the descending colon with the LEFT LATERAL abdominal wall, associated with abscess within the LEFT LATERAL peritoneal reflection measuring 2.7 x 1.7 centimeters. 2. No evidence for bowel obstruction. 3. Bilateral nonobstructing intrarenal calculi. 4. Hepatic and renal cysts. 5.  Aortic atherosclerosis.  (ICD10-I70.0) 6. Degenerative changes in the LOWER thoracic and lumbar spine. Electronically Signed: By: Nolon Nations M.D. On: 05/04/2019 19:31   Ct Abdomen Pelvis W Contrast  Result Date: 05/02/2019 CLINICAL DATA:  Abdominal pain, fever abscess suspected. Pancolitis. Lymphoma. EXAM: CT ABDOMEN AND PELVIS WITH CONTRAST TECHNIQUE: Multidetector CT imaging of the abdomen and pelvis was performed using the standard protocol following bolus administration of intravenous contrast. CONTRAST:  12m OMNIPAQUE IOHEXOL 300 MG/ML  SOLN COMPARISON:  02/27/2019. FINDINGS: Lower chest: Scattered scarring in the lung bases. Heart size normal. No pericardial or pleural effusion. Hepatobiliary: Millimetric low-attenuation lesions in the right hepatic lobe are unchanged and too small to characterize. Liver and gallbladder are otherwise unremarkable. No biliary ductal dilatation. Pancreas: Negative. Spleen: Negative. Adrenals/Urinary Tract: Adrenal glands are unremarkable. Low-attenuation lesions in the kidneys measure up to 1.1 cm on the right, as before. But too small to characterize, statistically, cysts are likely. A stone is seen in the right kidney. Ureters are decompressed. A 4 mm stone is seen in the dependent aspect of the bladder, at the left ureteral orifice. Bladder is otherwise low in volume. Stomach/Bowel: Stomach and majority of the small bowel is unremarkable. Right lower quadrant ileostomy. There is wall thickening and pericolonic inflammatory stranding involving the distal descending and sigmoid colon, similar to the  prior exam. The amount of pericolonic fluid appears subjectively decreased from 02/27/2019 but is difficult to measure. Soft tissue thickening extends to the left lateral abdominal wall, where there is a 1.1 cm peripherally hyperattenuating fluid collection (2/46), new. Probable small adjacent mesenteric organized fluid collection measuring 1.8 x 2.8 cm (2/58), likely similar. Adjacent cecum and small bowel loops appear tethered to the area of inflammation in the sigmoid colon. Appendix is not readily visualized. Vascular/Lymphatic: Atherosclerotic calcification of the aorta without aneurysm. No pathologically enlarged lymph nodes. Reproductive: Prostate is visualized. Other: Minimal presacral edema. Small amount of fluid is seen adjacent to cecum. Musculoskeletal: Degenerative changes in the spine. No worrisome lytic or sclerotic lesions. IMPRESSION: 1. Persistent colitis involving the descending/sigmoid colon with decrease in the amount of pericolonic fluid. A small organized fluid collection in the adjacent mesentery appears stable. A second adjacent tiny organized fluid collection along the left lateral abdominal wall appears new. Cecum and adjacent bowel loops appear tethered to the inflamed sigmoid colon. 2. Right renal stone.  Tiny left bladder stone/calcification. 3.  Aortic atherosclerosis (ICD10-170.0). Electronically Signed   By: MLorin PicketM.D.   On: 05/02/2019 13:13   Dg Chest Port 1 View  Result Date: 05/04/2019 CLINICAL DATA:  Cough, low-grade fever, diabetes mellitus, history lymphoma EXAM: PORTABLE CHEST 1 VIEW COMPARISON:  Portable exam 1820 hours compared to 01/01/2019 FINDINGS: RIGHT jugular Port-A-Cath with tip projecting over SVC. Mediastinal contours and pulmonary vascularity normal. Bibasilar scarring unchanged. No acute infiltrate, pleural effusion or pneumothorax. No acute osseous findings. IMPRESSION: Bibasilar scarring. No acute abnormalities. Electronically Signed   By: MLavonia DanaM.D.   On: 05/04/2019 18:48    Lab Data:  CBC: Recent Labs  Lab 05/05/19 0500 05/05/19 2128 05/06/19 0425 05/07/19 0452 05/08/19 0347 05/09/19 0416  WBC 0.8*  --  0.8* 1.2* 1.5* 2.4*  NEUTROABS  --   --   --  0.1* 0.3* 0.9*  HGB 6.8* 8.8* 9.1* 9.3* 9.7* 9.7*  HCT 22.3* 28.1* 28.7* 29.6* 31.5* 31.6*  MCV 107.7*  --  103.6* 103.5* 105.0* 104.6*  PLT 138*  --  136* 141* 150 295*   Basic Metabolic Panel: Recent Labs  Lab 05/04/19 1617 05/05/19 0500 05/08/19 0347 05/09/19 0416  NA 134* 135 138 137  K 3.4* 3.1* 3.4* 3.6  CL 98 102 107 106  CO2 25 24 25 25   GLUCOSE 236* 110* 88 106*  BUN 10 9 7* 10  CREATININE 0.52* 0.39* 0.40* 0.46*  CALCIUM 9.1 8.4* 8.6* 8.5*   GFR: Estimated Creatinine Clearance: 95.7 mL/min (A) (by C-G formula based on SCr of 0.46 mg/dL (L)). Liver Function Tests: Recent Labs  Lab 05/04/19 1617 05/05/19 0500 05/08/19 0347 05/09/19 0416  AST 25 17 10* 13*  ALT 58* 45* 18 19  ALKPHOS 96 74 65 63  BILITOT 0.5 0.6 0.5 0.8  PROT 6.6 5.9* 5.6* 5.5*  ALBUMIN 2.8* 2.5* 2.5* 2.5*   Recent Labs  Lab 05/04/19 1617  LIPASE 21   No results for input(s): AMMONIA in the last 168 hours. Coagulation Profile: No results for input(s): INR, PROTIME in the last 168 hours. Cardiac Enzymes: No results for input(s): CKTOTAL, CKMB, CKMBINDEX, TROPONINI in the last 168 hours. BNP (last 3 results) No results for input(s): PROBNP in the last 8760 hours. HbA1C: No results for input(s): HGBA1C in the last 72 hours. CBG: Recent Labs  Lab 05/08/19 1138 05/08/19 1700 05/08/19 2138 05/09/19 0712 05/09/19 1132  GLUCAP 170* 145* 145* 83 139*   Lipid Profile: No results for input(s): CHOL, HDL, LDLCALC, TRIG, CHOLHDL, LDLDIRECT in the last 72 hours. Thyroid Function Tests: No results for input(s): TSH, T4TOTAL, FREET4, T3FREE, THYROIDAB in the last 72 hours. Anemia Panel: Recent Labs    05/07/19 0452  RETICCTPCT 1.6   Urine analysis:     Component Value Date/Time   COLORURINE AMBER (A) 05/04/2019 1730   APPEARANCEUR HAZY (A) 05/04/2019 1730   LABSPEC 1.027 05/04/2019 1730   PHURINE 5.0 05/04/2019 1730   GLUCOSEU 50 (A) 05/04/2019 1730   HGBUR NEGATIVE 05/04/2019 1730   HGBUR negative 01/20/2009 0840   BILIRUBINUR NEGATIVE 05/04/2019 1730   BILIRUBINUR Small 01/30/2018 1652   KETONESUR NEGATIVE 05/04/2019 1730   PROTEINUR 30 (A) 05/04/2019 1730   UROBILINOGEN 2.0 (A) 01/30/2018 1652   UROBILINOGEN 0.2 01/20/2009 0840   NITRITE NEGATIVE 05/04/2019 1730   LEUKOCYTESUR NEGATIVE 05/04/2019 1730     Roshelle Traub M.D. [facility] 05/09/2019, 2:34 PM  Pager: [phone] Between 7am to 7pm - call Pager - [phone]  After 7pm go to [email or web address] - password [email or web address]  Call night coverage person covering after 7pm

## 2019-05-09 NOTE — Progress Notes (Signed)
Dressing change on sacral wound 8/26. Patient had duoderm dressing that when removed, pulls the skin and reopens skins tares. Wounds were cleansed and Allevyn Patch applied.

## 2019-05-09 NOTE — Progress Notes (Addendum)
Received notification from lab that pt had labs due. Arrived to room, noted only blood culture was ordered. Due to risk for false positives, blood culture should not be drawn from central line. Silva Bandy, phlebotomist made aware.

## 2019-05-10 DIAGNOSIS — D703 Neutropenia due to infection: Secondary | ICD-10-CM

## 2019-05-10 DIAGNOSIS — F325 Major depressive disorder, single episode, in full remission: Secondary | ICD-10-CM

## 2019-05-10 DIAGNOSIS — C8510 Unspecified B-cell lymphoma, unspecified site: Secondary | ICD-10-CM

## 2019-05-10 LAB — COMPREHENSIVE METABOLIC PANEL
ALT: 20 U/L (ref 0–44)
AST: 15 U/L (ref 15–41)
Albumin: 2.6 g/dL — ABNORMAL LOW (ref 3.5–5.0)
Alkaline Phosphatase: 61 U/L (ref 38–126)
Anion gap: 5 (ref 5–15)
BUN: 11 mg/dL (ref 8–23)
CO2: 25 mmol/L (ref 22–32)
Calcium: 8.6 mg/dL — ABNORMAL LOW (ref 8.9–10.3)
Chloride: 108 mmol/L (ref 98–111)
Creatinine, Ser: 0.42 mg/dL — ABNORMAL LOW (ref 0.61–1.24)
GFR calc Af Amer: >60 mL/min (ref >60)
GFR calc non Af Amer: >60 mL/min (ref >60)
Glucose, Bld: 100 mg/dL — ABNORMAL HIGH (ref 70–99)
Potassium: 3.5 mmol/L (ref 3.5–5.1)
Sodium: 138 mmol/L (ref 135–145)
Total Bilirubin: 0.5 mg/dL (ref 0.3–1.2)
Total Protein: 5.5 g/dL — ABNORMAL LOW (ref 6.5–8.1)

## 2019-05-10 LAB — CBC
HCT: 30.9 % — ABNORMAL LOW (ref 39.0–52.0)
Hemoglobin: 9.5 g/dL — ABNORMAL LOW (ref 13.0–17.0)
MCH: 32.3 pg (ref 26.0–34.0)
MCHC: 30.7 g/dL (ref 30.0–36.0)
MCV: 105.1 fL — ABNORMAL HIGH (ref 80.0–100.0)
Platelets: 128 10*3/uL — ABNORMAL LOW (ref 150–400)
RBC: 2.94 MIL/uL — ABNORMAL LOW (ref 4.22–5.81)
RDW: 16.5 % — ABNORMAL HIGH (ref 11.5–15.5)
WBC: 3.1 10*3/uL — ABNORMAL LOW (ref 4.0–10.5)
nRBC: 0 % (ref 0.0–0.2)

## 2019-05-10 LAB — DIFFERENTIAL
Basophils Absolute: 0 10*3/uL (ref 0.0–0.1)
Basophils Relative: 1 %
Eosinophils Absolute: 0 10*3/uL (ref 0.0–0.5)
Eosinophils Relative: 1 %
Lymphocytes Relative: 37 %
Lymphs Abs: 1.1 10*3/uL (ref 0.7–4.0)
Monocytes Absolute: 0.5 10*3/uL (ref 0.1–1.0)
Monocytes Relative: 16 %
Neutro Abs: 1.3 10*3/uL — ABNORMAL LOW (ref 1.7–7.7)
Neutrophils Relative %: 42 %

## 2019-05-10 LAB — GLUCOSE, CAPILLARY
Glucose-Capillary: 86 mg/dL (ref 70–99)
Glucose-Capillary: 169 mg/dL — ABNORMAL HIGH (ref 70–99)

## 2019-05-10 MED ORDER — SODIUM CHLORIDE 0.9 % IV SOLN
2.0000 g | INTRAVENOUS | Status: DC
  Administered 2019-05-10: 15:00:00 2 g via INTRAVENOUS
  Filled 2019-05-10: qty 2

## 2019-05-10 MED ORDER — CEFTRIAXONE IV (FOR PTA / DISCHARGE USE ONLY): 2.0000 g | INTRAVENOUS | 0 refills | Status: AC

## 2019-05-10 MED ORDER — HEPARIN SOD (PORK) LOCK FLUSH 100 UNIT/ML IV SOLN
500.0000 [IU] | INTRAVENOUS | Status: AC | PRN
  Administered 2019-05-10: 500 [IU]

## 2019-05-10 MED ORDER — TBO-FILGRASTIM 480 MCG/0.8ML ~~LOC~~ SOSY: 480.0000 ug | PREFILLED_SYRINGE | Freq: Once | SUBCUTANEOUS | Status: DC

## 2019-05-10 MED ORDER — METRONIDAZOLE 500 MG PO TABS: 500.0000 mg | ORAL_TABLET | Freq: Three times a day (TID) | ORAL | 0 refills | Status: AC

## 2019-05-10 MED ORDER — TBO-FILGRASTIM 480 MCG/0.8ML ~~LOC~~ SOSY
480.0000 ug | PREFILLED_SYRINGE | Freq: Once | SUBCUTANEOUS | Status: AC
  Administered 2019-05-10: 480 ug via SUBCUTANEOUS
  Filled 2019-05-10: qty 0.8

## 2019-05-10 NOTE — Progress Notes (Signed)
PHARMACY CONSULT NOTE FOR:  OUTPATIENT  PARENTERAL ANTIBIOTIC THERAPY (OPAT)  Indication: E. Coli bacteremia Regimen: Ceftriaxone 2gm IV q24h and metronidazole 552m PO TID End date: 9/10/20020  IV antibiotic discharge orders are pended. To discharging provider:  please sign these orders via discharge navigator,  Select New Orders & click on the button choice - Manage This Unsigned Work.     Thank you for allowing pharmacy to be a part of this patient's care.  DDoreene Eland PharmD, BCPS.   Work Cell: [phone]/27/2020 1:47 PM

## 2019-05-10 NOTE — TOC Transition Note (Signed)
Transition of Care [facility]) - CM/SW Discharge Note   Patient Details  Name: Mark Frey MRN: [id number] Date of Birth: [birth year]  [facility] CM/SW Contact:  Evona Westra, Marjie Skiff, RN Phone Number: 05/10/2019, 2:29 PM   Clinical Narrative:     Per pt wife, pt is receiving private HHPT through [facility] and will continue with them at discharge. Pam with [facility] was contacted for home IV infusion by ID MD. Roel Cluck to provide home infusion services at discharge.    Social Determinants of Health (SDOH) Interventions     Readmission Risk Interventions Readmission Risk Prevention Plan 02/08/2019  Transportation Screening Complete  Medication Review Press photographer) Complete  PCP or Specialist appointment within 3-5 days of discharge Complete  HRI or [place] Complete  SW Recovery Care/Counseling Consult Complete  Espy Not Applicable  Some recent data might be hidden

## 2019-05-10 NOTE — Plan of Care (Signed)
Pt was discharged home today. Instructions were reviewed with patient, and questions were answered. Pt was taken to main entrance via wheelchair by NT.  

## 2019-05-10 NOTE — Progress Notes (Signed)
[facility]    Date of Admission:  05/04/2019   Total days of antibiotics 7          ID: Mark Frey is a 63 y.o. male with  Hx of burkett's lymphoma admitted for fever, abdominal pain found to have neutropenia, intra-abdominal abscess with ecoli bacteremi Principal Problem:   Intra-abdominal abscess ([place]) Active Problems:   Hyperlipidemia   Irritable bowel syndrome   Controlled diabetes mellitus type II without complication (HCC)   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HCC)   Pancytopenia (HCC)   Neutropenia (HCC)   Depression, major, single episode, complete remission ([place])    Subjective: Underwent repeat imaging that showed improvement in fluid collection, still has significant inflammation to area - remains afebrile. WBC improving back to baseline  Persistent severe submucosal edema, hyperenhancement and inflammatory stranding throughout the sigmoid colon. The degree of hyperenhancement is slightly greater compared to the recent prior study. The submucosal fluid and gas collection seen on the prior study is improving. There is only trace residual fluid but it is decreased in volume. Surgical changes of end ileostomy. The distal ileum is adherent to the severely inflamed sigmoid colon and demonstrates submucosal edema and hyperenhancement. There is a small tract of enhancing soft tissue extending from the descending colon into the internal oblique musculature of the left lower quadrant abdominal wall. No evidence of abscess or fluid collection  Medications:   apixaban  5 mg Oral BID   citalopram  20 mg Oral Daily   dexamethasone  1 mg Oral q morning - 10a   dronabinol  5 mg Oral Daily   insulin aspart  0-5 Units Subcutaneous QHS   insulin aspart  0-9 Units Subcutaneous TID WC   magnesium oxide  400 mg Oral BID   zinc sulfate  220 mg Oral BID    Objective: Vital signs in last 24 hours: Temp:  [97.3 F (36.3 C)-98.4 F (36.9 C)] 98.2  F (36.8 C) (08/27 1305) Pulse Rate:  [66-96] 77 (08/27 1305) Resp:  [17-18] 18 (08/27 1305) BP: (98-114)/(62-78) 104/71 (08/27 1305) SpO2:  [96 %-100 %] 96 % (08/27 1305)     Lab Results Recent Labs    05/09/19 0416 05/10/19 0407 05/10/19 0851  WBC 2.4*  --  3.1*  HGB 9.7*  --  9.5*  HCT 31.6*  --  30.9*  NA 137 138  --   K 3.6 3.5  --   CL 106 108  --   CO2 25 25  --   BUN 10 11  --   CREATININE 0.46* 0.42*  --    Liver Panel Recent Labs    05/09/19 0416 05/10/19 0407  PROT 5.5* 5.5*  ALBUMIN 2.5* 2.6*  AST 13* 15  ALT 19 20  ALKPHOS 63 61  BILITOT 0.8 0.5    Microbiology: reviewed Studies/Results: Ct Abdomen Pelvis W Contrast  Result Date: 05/09/2019 CLINICAL DATA:  63 year old male with a history of Burkitt's lymphoma who completed chemotherapy in October of 2019 now presents with persistent colitis and small-bowel obstruction status post diverting loop ileostomy EXAM: CT ABDOMEN AND PELVIS WITH CONTRAST TECHNIQUE: Multidetector CT imaging of the abdomen and pelvis was performed using the standard protocol following bolus administration of intravenous contrast. CONTRAST:  119m OMNIPAQUE IOHEXOL 300 MG/ML  SOLN COMPARISON:  Most recent prior CT scan of the abdomen and pelvis 05/04/2019 FINDINGS: Lower chest: Minimal dependent atelectasis. Otherwise, the lung bases are clear. The heart is normal  in size. No pericardial effusion. Unremarkable distal esophagus. Hepatobiliary: Normal hepatic contour and morphology. No discrete hepatic lesions. Normal appearance of the gallbladder. No intra or extrahepatic biliary ductal dilatation. Pancreas: Unremarkable. No pancreatic ductal dilatation or surrounding inflammatory changes. Spleen: Normal in size without focal abnormality. Adrenals/Urinary Tract: Unremarkable adrenal glands. No evidence of hydronephrosis or enhancing renal mass. 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  Approximately 6 mm right renal stone without evidence of obstruction. The ureters are unremarkable. There is mild wall thickening of the left superolateral aspect of the bladder dome which is likely secondary to the inflammatory process involving the sigmoid colon. Stomach/Bowel: Persistent severe submucosal edema, hyperenhancement and inflammatory stranding throughout the sigmoid colon. The degree of hyperenhancement is slightly greater compared to the recent prior study. The submucosal fluid and gas collection seen on the prior study is improving. There is only trace residual fluid but it is decreased in volume. Surgical changes of end ileostomy. The distal ileum is adherent to the severely inflamed sigmoid colon and demonstrates submucosal edema and hyperenhancement. There is a small tract of enhancing soft tissue extending from the descending colon into the internal oblique musculature of the left lower quadrant abdominal wall. No evidence of abscess or fluid collection. Liquid stool present in the rectum. No intraperitoneal abscess or ascites. Vascular/Lymphatic: No suspicious lymphadenopathy. Trace aortic atherosclerotic calcifications. No aneurysm or dissection. Venous structures appear patent. Reproductive: Prostate is unremarkable. Other: No ascites. Musculoskeletal: No acute fracture or aggressive appearing lytic or blastic osseous lesion. IMPRESSION: 1. Persistent severe submucosal edema, hyperenhancement and inflammatory change involving the sigmoid colon. There is secondary inflammatory thickening of the left superolateral bladder dome as well as secondary inflammatory wall thickening and hyperenhancement of the distal ileum as it heads to the right lower quadrant diverting ileostomy. Findings remain consistent with an active infectious or inflammatory colitis. 2. Improving submucosal/intramural abscess along the wall of the affected sigmoid colon. 3. Additional ancillary findings as above without  significant interval change. Electronically Signed   By: Jacqulynn Cadet M.D.   On: 05/09/2019 21:01     Assessment/Plan: Intra-abdominal abscess with secondary ecoli bacteremia  - will plan to treat with 2 weeks of ceftriaxone 2gm iv daily plus oral metronidazole 536m po TID - through 9/10 - will see back in clinic and repeat blood cx and repeat abd imaging  ecoli bacteremia with portacath in place = likely secondary and will plan to treat for 14days. Will repeat surveillance culture to decide whether need to remove port.at this time, appears improved. No signs of infection at site.  rtc in 2 wk  [facility] Cell: [phone]: [phone]  05/10/2019, 1:30 PM

## 2019-05-10 NOTE — Discharge Summary (Signed)
Physician Discharge Summary   Patient ID: Mark Frey MRN: [id number] DOB/AGE: 63-Jun-1957 63 y.o.  Admit date: 05/04/2019 Discharge date: 05/10/2019  Primary Care Physician:  Mark Olp, MD   Recommendations for Outpatient Follow-up:  1. Follow up with PCP in 1-2 weeks 2. Patient to complete IV Rocephin 2 g daily, Flagyl 500 mg every 8 hours, and date on 05/24/2019, then follow-up with Dr. Graylon Frey in 2 weeks 3. Follow-up with Dr Mark Frey in 2 weeks  Home Health: Home health PT, RN Equipment/Devices:   Discharge Condition: Stable CODE STATUS: FULL  Diet recommendation: Heart healthy diet   Discharge Diagnoses:    . Intra-abdominal abscess (HCC) Sepsis from E. coli bacteremia Severe colitis,  infectious . Hyperlipidemia . Irritable bowel syndrome . 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 Pancytopenia ([place]) . Neutropenia ([place]) . Depression, major, single episode, complete remission ([place])   Consults: Infectious disease Oncology    Allergies:   Allergies  Allergen Reactions  . Ciprofloxacin Other (See Comments)    Leg tingling     DISCHARGE MEDICATIONS: Allergies as of 05/10/2019      Reactions   Ciprofloxacin Other (See Comments)   Leg tingling      Medication List    TAKE these medications   acetaminophen 325 MG tablet Commonly known as: TYLENOL Take 2 tablets (650 mg total) by mouth every 6 (six) hours as needed for mild pain (or Fever >/= 101).   apixaban 5 MG Tabs tablet Commonly known as: Eliquis DO NOT TAKE UNTIL SEEN BY YOUR ONCOLOGIST What changed:   how much to take  how to take this  when to take this   blood glucose meter kit and supplies Dispense based on patient and insurance preference. Use daily as directed. (E11.9).   cefTRIAXone  IVPB Commonly known as: ROCEPHIN Inject 2 g into the vein daily for 14 days. Indication: E. Coli bacteremia Last Day of Therapy: 05/24/19 Labs - Once weekly:  CBC/D and BMP, Labs - Every other week:   ESR and CRP   citalopram 20 MG tablet Commonly known as: CELEXA Take 1 tablet (20 mg total) by mouth daily.   dexamethasone 1 MG tablet Commonly known as: DECADRON Take 1 mg by mouth every morning.   dronabinol 5 MG capsule Commonly known as: MARINOL Take 1 capsule (5 mg total) by mouth 2 (two) times daily before lunch and supper. What changed: when to take this   glucose blood test strip Commonly known as: FREESTYLE TEST STRIPS Use to check blood sugar daily   HYDROcodone-acetaminophen 10-325 MG tablet Commonly known as: NORCO Take 1 tablet by mouth every 6 (six) hours as needed for moderate pain or severe pain.   magnesium oxide 400 (241.3 Mg) MG tablet Commonly known as: MAG-OX Take 1 tablet (400 mg total) by mouth 2 (two) times daily.   metroNIDAZOLE 500 MG tablet Commonly known as: Flagyl Take 1 tablet (500 mg total) by mouth 3 (three) times daily for 14 days.   VITAMIN B COMPLEX PO Take by mouth.   Zinc Sulfate 220 (50 Zn) MG Tabs Take 1 tablet by mouth 2 (two) times daily.            Home Infusion Instuctions  (From admission, onward)         Start     Ordered   05/10/19 0000  Home infusion instructions [facility] May follow [place] Dosing Protocol; May administer Cathflo as needed to maintain patency of vascular access  device.; Flushing of vascular access device: per [facility] Protocol: 0.9% NaCl pre/post medica...    Question Answer Comment  Instructions May follow [place] Dosing Protocol   Instructions May administer Cathflo as needed to maintain patency of vascular access device.   Instructions Flushing of vascular access device: per [facility] Protocol: 0.9% NaCl pre/post medication administration and prn patency; Heparin 100 u/ml, 88m for implanted ports and Heparin 10u/ml, 574mfor all other central venous catheters.   Instructions May follow [facility] Anaphylaxis Protocol for First Dose Administration in the home: 0.9% NaCl at 25-50 ml/hr to maintain  IV access for protocol meds. Epinephrine 0.3 ml IV/IM PRN and Benadryl 25-50 IV/IM PRN s/s of anaphylaxis.   Instructions [facility] Infusion Coordinator (RN) to assist per patient IV care needs in the home PRN.      05/10/19 1350           Brief H and P: For complete details please refer to admission H and P, but in brief *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 In ER, he was tachycardic and febrile. CT abdomen and pelvis showed colitis and abscess in the LLQ. ANC <100. He was started on empiric antibiotics with Cefepime and Flagyl.  Hospital Course:    [place]abdominal abscess ([place] from E. coli bacteremia -Patient presented with fevers, tachycardia, neutropenia.  CT abdomen showed left-sided abscess, patient was started on cefepime, Flagyl. -Blood cultures grew E. coli. -CT abdomen pelvis showed persistent teetering of the descending colon with left lateral abdominal wall, associated with abscess in the left lateral peritoneal reflection measuring 2.7x 1.7 cm -ID was consulted, closely followed by Dr. SnGraylon GoodRepeat CT abdomen and pelvis on 8/26 showed persistent severe submucosal edema, colitis involving the sigmoid colon however improving submucosal/intramural abscess along the wall of affected sigmoid colon. -Patient is now tolerating diet, WBC count improving, repeat blood cultures ordered -Dr. SnGraylon Goodecommended IV Rocephin 2 g daily and Flagyl 500 mg 3 times daily for 2 weeks.  Will likely need repeat imaging after the antibiotic course is completed.  Surveillance blood cultures also ordered as patient still has the Port-A-Cath.   Febrile neutropenia, leukopenia WBC count improving, received Granix WBC count 3.1 today, oncology recommended Granix before going home.  History of Burkitt's lymphoma in  remission -Oncology consulted, appreciate recommendations, continue Granix, Decadron, dronabinol  History of PE, DVT Continue apixaban  Diabetes mellitus type 2 Continue sliding scale insulin  Stage II pressure injury, sacrum -Present on admission.  Wound care was done by nursing.  Day of Discharge S: No complaints, patient feels better, hoping to go home.  Afebrile, abdominal pain improving  BP 104/71 (BP Location: Right Arm)   Pulse 77   Temp 98.2 F (36.8 C) (Oral)   Resp 18   Ht 5' 9"  (1.753 m)   Wt 73.9 kg   SpO2 96%   BMI 24.07 kg/m   Physical Exam: General: Alert and awake oriented x3 not in any acute distress. HEENT: anicteric sclera, pupils reactive to light and accommodation CVS: S1-S2 clear no murmur rubs or gallops Chest: clear to auscultation bilaterally, no wheezing rales or rhonchi Abdomen: soft colostomy, mild TTP, nondistended Extremities: no cyanosis, clubbing or edema noted bilaterally Neuro: Cranial nerves II-XII intact, no focal neurological deficits   The results of significant diagnostics from this hospitalization (including imaging, microbiology, ancillary and  laboratory) are listed below for reference.      Procedures/Studies:  Ct Abdomen Pelvis W Contrast  Result Date: 05/09/2019 CLINICAL DATA:  63 year old male with a history of Burkitt's lymphoma who completed chemotherapy in October of 2019 now presents with persistent colitis and small-bowel obstruction status post diverting loop ileostomy EXAM: CT ABDOMEN AND PELVIS WITH CONTRAST TECHNIQUE: Multidetector CT imaging of the abdomen and pelvis was performed using the standard protocol following bolus administration of intravenous contrast. CONTRAST:  174m OMNIPAQUE IOHEXOL 300 MG/ML  SOLN COMPARISON:  Most recent prior CT scan of the abdomen and pelvis 05/04/2019 FINDINGS: Lower chest: Minimal dependent atelectasis. Otherwise, the lung bases are clear. The heart is normal in size. No  pericardial effusion. Unremarkable distal esophagus. Hepatobiliary: Normal hepatic contour and morphology. No discrete hepatic lesions. Normal appearance of the gallbladder. No intra or extrahepatic biliary ductal dilatation. Pancreas: Unremarkable. No pancreatic ductal dilatation or surrounding inflammatory changes. Spleen: Normal in size without focal abnormality. Adrenals/Urinary Tract: Unremarkable adrenal glands. No evidence of hydronephrosis or enhancing renal mass. 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 Approximately 6 mm right renal stone without evidence of obstruction. The ureters are unremarkable. There is mild wall thickening of the left superolateral aspect of the bladder dome which is likely secondary to the inflammatory process involving the sigmoid colon. Stomach/Bowel: Persistent severe submucosal edema, hyperenhancement and inflammatory stranding throughout the sigmoid colon. The degree of hyperenhancement is slightly greater compared to the recent prior study. The submucosal fluid and gas collection seen on the prior study is improving. There is only trace residual fluid but it is decreased in volume. Surgical changes of end ileostomy. The distal ileum is adherent to the severely inflamed sigmoid colon and demonstrates submucosal edema and hyperenhancement. There is a small tract of enhancing soft tissue extending from the descending colon into the internal oblique musculature of the left lower quadrant abdominal wall. No evidence of abscess or fluid collection. Liquid stool present in the rectum. No intraperitoneal abscess or ascites. Vascular/Lymphatic: No suspicious lymphadenopathy. Trace aortic atherosclerotic calcifications. No aneurysm or dissection. Venous structures appear patent. Reproductive: Prostate is unremarkable. Other: No ascites. Musculoskeletal: No acute fracture or aggressive appearing lytic or blastic osseous lesion.  IMPRESSION: 1. Persistent severe submucosal edema, hyperenhancement and inflammatory change involving the sigmoid colon. There is secondary inflammatory thickening of the left superolateral bladder dome as well as secondary inflammatory wall thickening and hyperenhancement of the distal ileum as it heads to the right lower quadrant diverting ileostomy. Findings remain consistent with an active infectious or inflammatory colitis. 2. Improving submucosal/intramural abscess along the wall of the affected sigmoid colon. 3. Additional ancillary findings as above without significant interval change. Electronically Signed   By: HJacqulynn CadetM.D.   On: 05/09/2019 21:01   Ct Abdomen Pelvis W Contrast  Addendum Date: 05/04/2019   ADDENDUM REPORT: 05/04/2019 19:43 ADDENDUM: The appearance of the LEFT LATERAL abdominal wall abscess is stable compared with most recent comparison, 05/02/2019. The salient findings were discussed with ALacretia Leighon 05/04/2019 at 7:43 pm. Electronically Signed   By: ENolon NationsM.D.   On: 05/04/2019 19:43   Result Date: 05/04/2019 CLINICAL DATA:  Patient states he was called by his physician to come to the ED for an abscess in his colon. Patient has been having a low grade fever at home patient denies any pain at this time. Follow CT scan from 8/19. Per patient he has back pain. Patient's  history of lymphoma and bowel obstruction. EXAM: CT ABDOMEN AND PELVIS WITH CONTRAST TECHNIQUE: Multidetector CT imaging of the abdomen and pelvis was performed using the standard protocol following bolus administration of intravenous contrast. CONTRAST:  135m OMNIPAQUE IOHEXOL 300 MG/ML  SOLN COMPARISON:  02/27/2019, 05/02/2019, and multiple prior studies FINDINGS: Lower chest: Subsegmental atelectasis at the lung bases. Hepatobiliary: Small cysts are identified. Gallbladder is present. Note is made of a Phrygian cap. No evidence for acute cholecystitis. Pancreas: Unremarkable. No pancreatic  ductal dilatation or surrounding inflammatory changes. Spleen: Normal in size without focal abnormality. Adrenals/Urinary Tract: Normal adrenal glands. Small bilateral renal cysts. Intrarenal calculus in the RIGHT kidney measures 7 millimeters. Punctate calcification is identified in the UPPER pole of the LEFT kidney, 2 millimeters in diameter. No solid renal mass or hydronephrosis. The ureters are unremarkable. Stomach/Bowel: Stomach is unremarkable. RIGHT LOWER QUADRANT loop ileostomy is nonobstructing. There continues to be significant tethering and inflammatory change in the central pelvis and LEFT LOWER QUADRANT, extending to the LEFT pelvic sidewall. The cecum and sigmoid colon appear to be adherent. There are coarse calcifications in the region of the soft tissue between the cecum and sigmoid colon. There is persistent tethering of the descending colon with the LEFT LATERAL abdominal wall. There is persistent focal low-attenuation within the LEFT LATERAL peritoneal reflection, consistent with abscess, now measuring 2.7 x 1.7 centimeters. This is best seen on image 35 of series 7. There is associated subcutaneous edema overlying the LEFT LATERAL abdominal wall. Vascular/Lymphatic: There is atherosclerotic calcification of the abdominal aorta. No aneurysm. No retroperitoneal or mesenteric adenopathy. Reproductive: Prostate is unremarkable. Other: None Musculoskeletal: Degenerative changes are seen in the LOWER thoracic and lumbar spine. No suspicious lytic or blastic lesions are identified. IMPRESSION: 1. Persistent tethering of the descending colon with the LEFT LATERAL abdominal wall, associated with abscess within the LEFT LATERAL peritoneal reflection measuring 2.7 x 1.7 centimeters. 2. No evidence for bowel obstruction. 3. Bilateral nonobstructing intrarenal calculi. 4. Hepatic and renal cysts. 5.  Aortic atherosclerosis.  (ICD10-I70.0) 6. Degenerative changes in the LOWER thoracic and lumbar spine.  Electronically Signed: By: ENolon NationsM.D. On: 05/04/2019 19:31   Ct Abdomen Pelvis W Contrast  Result Date: 05/02/2019 CLINICAL DATA:  Abdominal pain, fever abscess suspected. Pancolitis. Lymphoma. EXAM: CT ABDOMEN AND PELVIS WITH CONTRAST TECHNIQUE: Multidetector CT imaging of the abdomen and pelvis was performed using the standard protocol following bolus administration of intravenous contrast. CONTRAST:  1050mOMNIPAQUE IOHEXOL 300 MG/ML  SOLN COMPARISON:  02/27/2019. FINDINGS: Lower chest: Scattered scarring in the lung bases. Heart size normal. No pericardial or pleural effusion. Hepatobiliary: Millimetric low-attenuation lesions in the right hepatic lobe are unchanged and too small to characterize. Liver and gallbladder are otherwise unremarkable. No biliary ductal dilatation. Pancreas: Negative. Spleen: Negative. Adrenals/Urinary Tract: Adrenal glands are unremarkable. Low-attenuation lesions in the kidneys measure up to 1.1 cm on the right, as before. But too small to characterize, statistically, cysts are likely. A stone is seen in the right kidney. Ureters are decompressed. A 4 mm stone is seen in the dependent aspect of the bladder, at the left ureteral orifice. Bladder is otherwise low in volume. Stomach/Bowel: Stomach and majority of the small bowel is unremarkable. Right lower quadrant ileostomy. There is wall thickening and pericolonic inflammatory stranding involving the distal descending and sigmoid colon, similar to the prior exam. The amount of pericolonic fluid appears subjectively decreased from 02/27/2019 but is difficult to measure. Soft tissue thickening extends to the left lateral  abdominal wall, where there is a 1.1 cm peripherally hyperattenuating fluid collection (2/46), new. Probable small adjacent mesenteric organized fluid collection measuring 1.8 x 2.8 cm (2/58), likely similar. Adjacent cecum and small bowel loops appear tethered to the area of inflammation in the sigmoid  colon. Appendix is not readily visualized. Vascular/Lymphatic: Atherosclerotic calcification of the aorta without aneurysm. No pathologically enlarged lymph nodes. Reproductive: Prostate is visualized. Other: Minimal presacral edema. Small amount of fluid is seen adjacent to cecum. Musculoskeletal: Degenerative changes in the spine. No worrisome lytic or sclerotic lesions. IMPRESSION: 1. Persistent colitis involving the descending/sigmoid colon with decrease in the amount of pericolonic fluid. A small organized fluid collection in the adjacent mesentery appears stable. A second adjacent tiny organized fluid collection along the left lateral abdominal wall appears new. Cecum and adjacent bowel loops appear tethered to the inflamed sigmoid colon. 2. Right renal stone.  Tiny left bladder stone/calcification. 3.  Aortic atherosclerosis (ICD10-170.0). Electronically Signed   By: Lorin Picket M.D.   On: 05/02/2019 13:13   Dg Chest Port 1 View  Result Date: 05/04/2019 CLINICAL DATA:  Cough, low-grade fever, diabetes mellitus, history lymphoma EXAM: PORTABLE CHEST 1 VIEW COMPARISON:  Portable exam 1820 hours compared to 01/01/2019 FINDINGS: RIGHT jugular Port-A-Cath with tip projecting over SVC. Mediastinal contours and pulmonary vascularity normal. Bibasilar scarring unchanged. No acute infiltrate, pleural effusion or pneumothorax. No acute osseous findings. IMPRESSION: Bibasilar scarring. No acute abnormalities. Electronically Signed   By: Lavonia Dana M.D.   On: 05/04/2019 18:48       LAB RESULTS: Basic Metabolic Panel: Recent Labs  Lab 05/09/19 0416 05/10/19 0407  NA 137 138  K 3.6 3.5  CL 106 108  CO2 25 25  GLUCOSE 106* 100*  BUN 10 11  CREATININE 0.46* 0.42*  CALCIUM 8.5* 8.6*   Liver Function Tests: Recent Labs  Lab 05/09/19 0416 05/10/19 0407  AST 13* 15  ALT 19 20  ALKPHOS 63 61  BILITOT 0.8 0.5  PROT 5.5* 5.5*  ALBUMIN 2.5* 2.6*   Recent Labs  Lab 05/04/19 1617  LIPASE 21    No results for input(s): AMMONIA in the last 168 hours. CBC: Recent Labs  Lab 05/09/19 0416 05/10/19 0851  WBC 2.4* 3.1*  NEUTROABS 0.9* 1.3*  HGB 9.7* 9.5*  HCT 31.6* 30.9*  MCV 104.6* 105.1*  PLT 145* 128*   Cardiac Enzymes: No results for input(s): CKTOTAL, CKMB, CKMBINDEX, TROPONINI in the last 168 hours. BNP: Invalid input(s): POCBNP CBG: Recent Labs  Lab 05/10/19 0730 05/10/19 1141  GLUCAP 86 169*      Disposition and Follow-up: Discharge Instructions    Diet general   Complete by: As directed    Home infusion instructions [facility] May follow [place] Dosing Protocol; May administer Cathflo as needed to maintain patency of vascular access device.; Flushing of vascular access device: per [facility] Protocol: 0.9% NaCl pre/post medica...   Complete by: As directed    Instructions: May follow [place] Dosing Protocol   Instructions: May administer Cathflo as needed to maintain patency of vascular access device.   Instructions: Flushing of vascular access device: per [facility] Protocol: 0.9% NaCl pre/post medication administration and prn patency; Heparin 100 u/ml, 35m for implanted ports and Heparin 10u/ml, 547mfor all other central venous catheters.   Instructions: May follow [facility] Anaphylaxis Protocol for First Dose Administration in the home: 0.9% NaCl at 25-50 ml/hr to maintain IV access for protocol meds. Epinephrine 0.3 ml IV/IM PRN and Benadryl  25-50 IV/IM PRN s/s of anaphylaxis.   Instructions: [place] Infusion Coordinator (RN) to assist per patient IV care needs in the home PRN.   Increase activity slowly   Complete by: As directed        DISPOSITION: Home with home health   [place]    Mark Olp, MD. Schedule an appointment as soon as possible for a visit in 2 week(s).   Specialty: Family Medicine Contact information: [address] [place] [address] [phone]         Carlyle Basques, MD. Schedule an appointment as soon as possible for a visit in 2 week(s).   Specialty: Infectious Diseases Contact information: [place] [address] [phone]            Time coordinating discharge:  40 minutes  Signed:   Estill Cotta M.D. [facility] 05/10/2019, 2:11 PM

## 2019-05-10 NOTE — Progress Notes (Addendum)
Brief Oncology Note:  CBC with differential from today has been reviewed.  ANC is up to 1.3.  It was 0.9 yesterday.  His white blood cell count continues to improve.  Initial plan was to continue Granix 480 mcg daily until [place] is 1000 or higher x2 days.  Will give 1 additional dose of Granix today.  Recheck CBC with differential in the morning.  Mikey Bussing, DNP, AGPCNP-BC, AOCNP  Hematology oncology inpatient follow-up note  S: Patient is in good spirits today.  No further fevers.  No significant abdominal pain at this time.  Is being scheduled for discharge today on IV antibiotics with outpatient infectious disease follow-up and GI follow-up. Patient has been eating well with no other acute new concerns.  O: .BP 104/71 (BP Location: Right Arm)   Pulse 77   Temp 98.2 F (36.8 C) (Oral)   Resp 18   Ht 5' 9"  (1.753 m)   Wt 163 lb (73.9 kg)   SpO2 96%   BMI 24.07 kg/m  . GENERAL:alert, in no acute distress and comfortable SKIN: no acute rashes, no significant lesions EYES: conjunctiva are pink and non-injected, sclera anicteric OROPHARYNX: MMM, no exudates, no oropharyngeal erythema or ulceration NECK: supple, no JVD LYMPH:  no palpable lymphadenopathy in the cervical, axillary or inguinal regions LUNGS: clear to auscultation b/l with normal respiratory effort HEART: regular rate & rhythm ABDOMEN: Diverting ileostomy in situ.  Bowel sounds present.  No significant focal tenderness to palpation no guarding rigidity or rebound Extremity: Trace pedal edema PSYCH: alert & oriented x 3 with fluent speech NEURO: no focal motor/sensory deficits  MEDICATIONS .No current facility-administered medications for this encounter.   Current Outpatient Medications:  .  acetaminophen (TYLENOL) 325 MG tablet, Take 2 tablets (650 mg total) by mouth every 6 (six) hours as needed for mild pain (or Fever >/= 101)., Disp: 30 tablet, Rfl: 0 .  apixaban (ELIQUIS) 5 MG TABS tablet, DO NOT TAKE UNTIL  SEEN BY YOUR ONCOLOGIST (Patient taking differently: Take 5 mg by mouth 2 (two) times daily. DO NOT TAKE UNTIL SEEN BY YOUR ONCOLOGIST), Disp: 60 tablet, Rfl: 3 .  B Complex Vitamins (VITAMIN B COMPLEX PO), Take by mouth., Disp: , Rfl:  .  citalopram (CELEXA) 20 MG tablet, Take 1 tablet (20 mg total) by mouth daily., Disp: 90 tablet, Rfl: 3 .  dexamethasone (DECADRON) 1 MG tablet, Take 1 mg by mouth every morning., Disp: , Rfl:  .  dronabinol (MARINOL) 5 MG capsule, Take 1 capsule (5 mg total) by mouth 2 (two) times daily before lunch and supper. (Patient taking differently: Take 5 mg by mouth daily. ), Disp: 60 capsule, Rfl: 0 .  HYDROcodone-acetaminophen (NORCO) 10-325 MG tablet, Take 1 tablet by mouth every 6 (six) hours as needed for moderate pain or severe pain., Disp: 30 tablet, Rfl: 0 .  magnesium oxide (MAG-OX) 400 (241.3 Mg) MG tablet, Take 1 tablet (400 mg total) by mouth 2 (two) times daily., Disp: 60 tablet, Rfl: 0 .  Zinc Sulfate 220 (50 Zn) MG TABS, Take 1 tablet by mouth 2 (two) times daily., Disp: , Rfl:  .  blood glucose meter kit and supplies, Dispense based on patient and insurance preference. Use daily as directed. (E11.9)., Disp: 1 each, Rfl: 0 .  cefTRIAXone (ROCEPHIN) IVPB, Inject 2 g into the vein daily for 14 days. Indication: E. Coli bacteremia Last Day of Therapy: 05/24/19 Labs - Once weekly:  CBC/D and BMP, Labs - Every other week:  ESR and CRP, Disp: 14 Units, Rfl: 0 .  glucose blood (FREESTYLE TEST STRIPS) test strip, Use to check blood sugar daily, Disp: 100 each, Rfl: 4 .  metroNIDAZOLE (FLAGYL) 500 MG tablet, Take 1 tablet (500 mg total) by mouth 3 (three) times daily for 14 days., Disp: 42 tablet, Rfl: 0  LABS  CBC Latest Ref Rng & Units 05/10/2019 05/09/2019 05/08/2019  WBC 4.0 - 10.5 K/uL 3.1(L) 2.4(L) 1.5(L)  Hemoglobin 13.0 - 17.0 g/dL 9.5(L) 9.7(L) 9.7(L)  Hematocrit 39.0 - 52.0 % 30.9(L) 31.6(L) 31.5(L)  Platelets 150 - 400 K/uL 128(L) 145(L) 150   ANC  1300   CMP Latest Ref Rng & Units 05/10/2019 05/09/2019 05/08/2019  Glucose 70 - 99 mg/dL 100(H) 106(H) 88  BUN 8 - 23 mg/dL 11 10 7(L)  Creatinine 0.61 - 1.24 mg/dL 0.42(L) 0.46(L) 0.40(L)  Sodium 135 - 145 mmol/L 138 137 138  Potassium 3.5 - 5.1 mmol/L 3.5 3.6 3.4(L)  Chloride 98 - 111 mmol/L 108 106 107  CO2 22 - 32 mmol/L 25 25 25   Calcium 8.9 - 10.3 mg/dL 8.6(L) 8.5(L) 8.6(L)  Total Protein 6.5 - 8.1 g/dL 5.5(L) 5.5(L) 5.6(L)  Total Bilirubin 0.3 - 1.2 mg/dL 0.5 0.8 0.5  Alkaline Phos 38 - 126 U/L 61 63 65  AST 15 - 41 U/L 15 13(L) 10(L)  ALT 0 - 44 U/L 20 19 18    Assessment and plan  63 y.o. 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 coli sepsis             (a) Granix started 05/05/2019;  patient was treated with Granix with improvement in his neutropenia with near complete resolution.  ANC today is up to 1300 and the patient did receive an additional Granix dose today. PLAN -no signs of lymphoma at this time -Discussed that the neutropenia was related to his sepsis and is resolving with growth factor support.  No additional outpatient growth factor support planned at this time. -Patient will continue his antibiotic plan and follow-up with infectious disease as outpatient. -Would recommend outpatient follow-up with gastroenterology to determine the  cause of his persistent colitis which is outside the typical time durations of radiation colitis.  Suspect possible underlying inflammatory bowel disease. -We will follow-up with the patient in clinic in 2 to 3 weeks with labs. -Appreciate excellent hospitalist cares  Sullivan Lone MD [place] AAHIVMS [facility] Hematology/Oncology Physician [facility]  (Office):        [phone] (Work cell): [phone] (Fax):            [phone]

## 2019-05-11 ENCOUNTER — Telehealth: Payer: Self-pay

## 2019-05-11 DIAGNOSIS — R269 Unspecified abnormalities of gait and mobility: Secondary | ICD-10-CM | POA: Diagnosis not present

## 2019-05-11 DIAGNOSIS — K5100 Ulcerative (chronic) pancolitis without complications: Secondary | ICD-10-CM | POA: Diagnosis not present

## 2019-05-11 DIAGNOSIS — C162 Malignant neoplasm of body of stomach: Secondary | ICD-10-CM | POA: Diagnosis not present

## 2019-05-11 DIAGNOSIS — R7881 Bacteremia: Secondary | ICD-10-CM | POA: Diagnosis not present

## 2019-05-11 DIAGNOSIS — K651 Peritoneal abscess: Secondary | ICD-10-CM | POA: Diagnosis not present

## 2019-05-11 MED ORDER — DEXAMETHASONE 1 MG PO TABS: 1.0000 mg | ORAL_TABLET | Freq: Every morning | ORAL | 2 refills | Status: DC

## 2019-05-11 NOTE — Telephone Encounter (Signed)
Called wife and advised. She will monitor his appetite and adjust dose based on conversation from last visit.

## 2019-05-11 NOTE — Telephone Encounter (Signed)
Forwarding to Dr. Yong Channel to advise if OK to refill.

## 2019-05-11 NOTE — Telephone Encounter (Signed)
Patient's wife [name] called and stated patient was discharged from the hospital last night and was instructed to hold Eliquis until seen by Oncologist. [name] made aware and stated it is ok for patient to resume Eliquis. [name] made aware and verbalized understanding. Per [name] appointment for Thursday 9/3 to be canceled and rescheduled 2 weeks out. Scheduling message sent to reschedule appointments. [name] made aware and verbalized understanding.

## 2019-05-11 NOTE — Addendum Note (Signed)
Addended by: [name] on: 05/11/2019 10:34 AM   Modules accepted: Orders

## 2019-05-11 NOTE — Telephone Encounter (Signed)
Wife calling to follow up on this Rx.  She states at virtual visit 8/4,  Dr Yong Channel was supportive of pt continuing this med.  Pt thought Dr Yong Channel was going to refill after this visit Wife states she has called [facility] and no response.  She is requesting send to  [facility] [address]

## 2019-05-14 ENCOUNTER — Encounter: Payer: Self-pay | Admitting: Hematology

## 2019-05-14 ENCOUNTER — Other Ambulatory Visit (HOSPITAL_COMMUNITY)
Admission: RE | Admit: 2019-05-14 | Discharge: 2019-05-14 | Disposition: A | Discharge status: Home / Self Care | Payer: BC Managed Care – PPO | Source: Other Acute Inpatient Hospital | Attending: Internal Medicine | Admitting: Internal Medicine

## 2019-05-14 DIAGNOSIS — K651 Peritoneal abscess: Secondary | ICD-10-CM | POA: Diagnosis not present

## 2019-05-14 DIAGNOSIS — R269 Unspecified abnormalities of gait and mobility: Secondary | ICD-10-CM | POA: Diagnosis not present

## 2019-05-14 DIAGNOSIS — C162 Malignant neoplasm of body of stomach: Secondary | ICD-10-CM | POA: Diagnosis not present

## 2019-05-14 DIAGNOSIS — R7881 Bacteremia: Secondary | ICD-10-CM | POA: Diagnosis not present

## 2019-05-14 DIAGNOSIS — K5100 Ulcerative (chronic) pancolitis without complications: Secondary | ICD-10-CM | POA: Insufficient documentation

## 2019-05-14 LAB — CBC WITH DIFFERENTIAL/PLATELET
Abs Immature Granulocytes: 0.18 10*3/uL — ABNORMAL HIGH (ref 0.00–0.07)
Basophils Absolute: 0 10*3/uL (ref 0.0–0.1)
Basophils Relative: 1 %
Eosinophils Absolute: 0 10*3/uL (ref 0.0–0.5)
Eosinophils Relative: 0 %
HCT: 34.2 % — ABNORMAL LOW (ref 39.0–52.0)
Hemoglobin: 10.3 g/dL — ABNORMAL LOW (ref 13.0–17.0)
Immature Granulocytes: 3 %
Lymphocytes Relative: 25 %
Lymphs Abs: 1.4 10*3/uL (ref 0.7–4.0)
MCH: 32.8 pg (ref 26.0–34.0)
MCHC: 30.1 g/dL (ref 30.0–36.0)
MCV: 108.9 fL — ABNORMAL HIGH (ref 80.0–100.0)
Monocytes Absolute: 0.8 10*3/uL (ref 0.1–1.0)
Monocytes Relative: 14 %
Neutro Abs: 3.3 10*3/uL (ref 1.7–7.7)
Neutrophils Relative %: 57 %
Platelets: 155 10*3/uL (ref 150–400)
RBC: 3.14 MIL/uL — ABNORMAL LOW (ref 4.22–5.81)
RDW: 17.7 % — ABNORMAL HIGH (ref 11.5–15.5)
WBC: 5.8 10*3/uL (ref 4.0–10.5)
nRBC: 0 % (ref 0.0–0.2)

## 2019-05-14 LAB — BASIC METABOLIC PANEL
Anion gap: 8 (ref 5–15)
BUN: 14 mg/dL (ref 8–23)
CO2: 27 mmol/L (ref 22–32)
Calcium: 8.8 mg/dL — ABNORMAL LOW (ref 8.9–10.3)
Chloride: 107 mmol/L (ref 98–111)
Creatinine, Ser: 0.57 mg/dL — ABNORMAL LOW (ref 0.61–1.24)
GFR calc Af Amer: >60 mL/min (ref >60)
GFR calc non Af Amer: >60 mL/min (ref >60)
Glucose, Bld: 115 mg/dL — ABNORMAL HIGH (ref 70–99)
Potassium: 3.6 mmol/L (ref 3.5–5.1)
Sodium: 142 mmol/L (ref 135–145)

## 2019-05-14 LAB — C-REACTIVE PROTEIN: CRP: 1 mg/dL — ABNORMAL HIGH (ref <1.0)

## 2019-05-14 LAB — SEDIMENTATION RATE: Sed Rate: 2 mm/hr (ref 0–16)

## 2019-05-15 ENCOUNTER — Telehealth: Payer: Self-pay | Admitting: Hematology

## 2019-05-15 LAB — CULTURE, BLOOD (ROUTINE X 2)
Culture: NO GROWTH
Culture: NO GROWTH
Special Requests: ADEQUATE
Special Requests: ADEQUATE

## 2019-05-15 NOTE — Telephone Encounter (Signed)
R/s appt per 9/1 sch message - unable to reach pt . Left message with appt date and time

## 2019-05-16 ENCOUNTER — Encounter: Payer: Self-pay | Admitting: Family Medicine

## 2019-05-16 DIAGNOSIS — R269 Unspecified abnormalities of gait and mobility: Secondary | ICD-10-CM | POA: Diagnosis not present

## 2019-05-16 DIAGNOSIS — C162 Malignant neoplasm of body of stomach: Secondary | ICD-10-CM | POA: Diagnosis not present

## 2019-05-17 ENCOUNTER — Ambulatory Visit: Payer: BC Managed Care – PPO | Admitting: Hematology

## 2019-05-17 ENCOUNTER — Other Ambulatory Visit: Payer: BC Managed Care – PPO

## 2019-05-18 DIAGNOSIS — C162 Malignant neoplasm of body of stomach: Secondary | ICD-10-CM | POA: Diagnosis not present

## 2019-05-18 DIAGNOSIS — R269 Unspecified abnormalities of gait and mobility: Secondary | ICD-10-CM | POA: Diagnosis not present

## 2019-05-18 DIAGNOSIS — K5100 Ulcerative (chronic) pancolitis without complications: Secondary | ICD-10-CM | POA: Diagnosis not present

## 2019-05-18 DIAGNOSIS — R7881 Bacteremia: Secondary | ICD-10-CM | POA: Diagnosis not present

## 2019-05-18 DIAGNOSIS — K651 Peritoneal abscess: Secondary | ICD-10-CM | POA: Diagnosis not present

## 2019-05-18 NOTE — Patient Instructions (Signed)
Health Maintenance Due  Topic Date Due  . PNEUMOCOCCAL POLYSACCHARIDE VACCINE AGE 63-64 HIGH RISK  07/15/1958  . TETANUS/TDAP  09/17/2008  . OPHTHALMOLOGY EXAM  03/23/2016  . FOOT EXAM  01/31/2018  . URINE MICROALBUMIN  01/31/2018  . INFLUENZA VACCINE  04/14/2019    Depression screen [facility] 2/9 04/17/2019 05/22/2018  Decreased Interest 2 0  Down, Depressed, Hopeless 2 0  PHQ - 2 Score 4 0  Altered sleeping 0 -  Tired, decreased energy 1 -  Change in appetite 0 -  Feeling bad or failure about yourself  0 -  Trouble concentrating 0 -  Moving slowly or fidgety/restless 0 -  Suicidal thoughts 0 -  PHQ-9 Score 5 -  Difficult doing work/chores Not difficult at all -  Some recent data might be hidden

## 2019-05-18 NOTE — Progress Notes (Signed)
Phone [phone]   Subjective:  Virtual visit via Video note. Chief complaint: Chief Complaint  Patient presents with  . Follow-up  . Pancolitis  . Diabetes  . Depression  . Pulmonary Embolism  . Severe Protein-Calorie Malnutrition  . Hypomagnesemia  . Macrocytosis   This visit type was conducted due to national recommendations for restrictions regarding the COVID-19 Pandemic (e.g. social distancing).  This format is felt to be most appropriate for this patient at this time balancing risks to patient and risks to population by having him in for in person visit.  No physical exam was performed (except for noted visual exam or audio findings with Telehealth visits).    Our team/I connected with [name] at 10:40 AM EDT by a video enabled telemedicine application (doxy.me or caregility through epic) and verified that I am speaking with the correct person using two identifiers.  Location patient: Home[place] Location provider: [facility], office Persons participating in the virtual visit:  patient  Our team/I discussed the limitations of evaluation and management by telemedicine and the availability of in person appointments. In light of current covid-19 pandemic, patient also understands that we are trying to protect them by minimizing in office contact if at all possible.  The patient expressed consent for telemedicine visit and agreed to proceed. Patient understands insurance will be billed.   ROS-no fever or chills reported.  No abdominal pain reported.  Weight gain noted-intentional  Past Medical History-  Patient Active Problem List   Diagnosis Date Noted  . Bowel obstruction in setting of lymphoma and sigmoid stricture 07/21/2018    Priority: High  . Deep vein thrombosis (DVT) of femoral vein of right lower extremity (HCC)     Priority: High  . 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[place]) 04/10/2018    Priority: High  . 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 Bilateral pulmonary embolism ([place]) 03/10/2018    Priority: High  . Controlled diabetes mellitus type II without complication ([place]) 96/75/9163    Priority: High  . Depression, major, single episode, complete remission ([place]) 04/17/2019    Priority: Medium  . Tachycardia 01/31/2017    Priority: Medium  . Hypertension 06/05/2014    Priority: Medium  . Irritable bowel syndrome 03/30/2010    Priority: Medium  . Hyperlipidemia 05/12/2007    Priority: Medium  . 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 Dysmetabolic syndrome X 84/66/5993    Priority: Low  . BACK PAIN WITH RADICULOPATHY 06/09/2007    Priority: Low  . ALLERGIC RHINITIS 05/12/2007    Priority: Low  . Intra-abdominal abscess ([place]) 05/04/2019  . Pancolitis ([place]) 02/22/2019  . 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 Thrombocytopenia ([place])   . Symptomatic anemia   . Atrophy of muscle of multiple sites   . Goals of care, counseling/discussion   . Hypomagnesemia 01/11/2019  . Neutropenia ([place]) 01/11/2019  . Other neutropenia ([place])   . Pressure injury of skin 01/07/2019  . Zinc deficiency   . Radiation gastroenteritis   . Radiation colitis   . Hyponatremia 01/01/2019  . Pancytopenia ([place]) 01/01/2019  . Hematochezia   . Loop ilesotomy for fecal diversion of sigmoid stricture 08/25/2018  . Severe protein-calorie malnutrition ([place]) 08/19/2018  . Low magnesium level 07/30/2018  . Edema   . Counseling regarding advance care planning and goals of care 04/25/2018  . Gastrointestinal hemorrhage   .  Port-A-Cath in place 03/27/2018  . Occult blood in stools 03/10/2018    Medications- reviewed and updated Current Outpatient Medications  Medication Sig Dispense Refill  . acetaminophen (TYLENOL) 325 MG tablet Take 2 tablets (650 mg total) by mouth every 6 (six) hours as needed for mild pain (or Fever >/= 101). 30 tablet 0  . apixaban  (ELIQUIS) 5 MG TABS tablet DO NOT TAKE UNTIL SEEN BY YOUR ONCOLOGIST (Patient taking differently: Take 5 mg by mouth 2 (two) times daily. DO NOT TAKE UNTIL SEEN BY YOUR ONCOLOGIST) 60 tablet 3  . B Complex Vitamins (VITAMIN B COMPLEX PO) Take by mouth.    . blood glucose meter kit and supplies Dispense based on patient and insurance preference. Use daily as directed. (E11.9). 1 each 0  . cefTRIAXone (ROCEPHIN) IVPB Inject 2 g into the vein daily for 14 days. Indication: E. Coli bacteremia Last Day of Therapy: 05/24/19 Labs - Once weekly:  CBC/D and BMP, Labs - Every other week:  ESR and CRP 14 Units 0  . citalopram (CELEXA) 20 MG tablet Take 1 tablet (20 mg total) by mouth daily. 90 tablet 3  . dexamethasone (DECADRON) 1 MG tablet Take 1 tablet (1 mg total) by mouth every morning. 30 tablet 2  . dronabinol (MARINOL) 5 MG capsule Take 1 capsule (5 mg total) by mouth 2 (two) times daily before lunch and supper. (Patient taking differently: Take 5 mg by mouth daily. ) 60 capsule 0  . glucose blood (FREESTYLE TEST STRIPS) test strip Use to check blood sugar daily 100 each 4  . HYDROcodone-acetaminophen (NORCO) 10-325 MG tablet Take 1 tablet by mouth every 6 (six) hours as needed for moderate pain or severe pain. 30 tablet 0  . magnesium oxide (MAG-OX) 400 (241.3 Mg) MG tablet Take 1 tablet (400 mg total) by mouth 2 (two) times daily. 60 tablet 0  . metroNIDAZOLE (FLAGYL) 500 MG tablet Take 1 tablet (500 mg total) by mouth 3 (three) times daily for 14 days. 42 tablet 0  . Zinc Sulfate 220 (50 Zn) MG TABS Take 1 tablet by mouth 2 (two) times daily.     No current facility-administered medications for this visit.      Objective:  Temp (!) 97.4 F (36.3 C)   Ht 5' 9"  (1.753 m)   Wt 184 lb (83.5 kg)   BMI 27.17 kg/m  self reported vitals Gen: NAD, resting comfortably Lungs: nonlabored, normal respiratory rate  Skin: appears dry, no obvious rash    Assessment and Plan   #Strength improving/PT  helps- working on left leg/hip  # Pancolitis/Sigmoid Stricture S: Patient was hospitalized from August 21 to August 27 with an intra-abdominal abscess and infectious colitis as well as sepsis related to E. coli bacteremia.  Patient originally presented with fever/tachycardia/neutropenia.  CT of the abdomen showed abscess in the left lateral abdominal wall measuring 2.7 x 1.7 cm.  Infectious disease was consulted-Dr. Baxter Flattery followed while hospitalized.  Repeat CT of the abdomen and pelvis on August 26 showed persistent severe submucosal edema, colitis involving sigmoid colon but at least the abscess appeared to be improving.  Patient was discharged home with Rocephin 2 g daily IV as well as Flagyl 500 mg 3 times daily for a total of 2 weeks-plan was for repeat blood cultures as well as repeat imaging before stopping antibiotics.  Reviewed repeat blood cultures which were negative.  Per patient-Finishes on Thursday with IV antibiotics on Thursday and orals. A/P: Patient states he  is feeling very well- my suspicion is that abscess is improving.  I contacted Dr. Baxter Flattery and she did say patient needed updated CT scan of abdomen and pelvis-we ordered stat CT scan so hopefully this can be completed and resulted by Thursday- that way we can make a decision if antibiotics need to be continued on Friday and on ongoing basis-we will be in contact with Dr. Baxter Flattery.  #Febrile neutropenia/history of Burkitt's lymphoma in remission \# Severe Protein-Calorie Malnutrition  S: Patient received Granix while hospitalized-white count was improving.  Patient previously had been suffering from weight loss and had been placed on Decadron and dronabinol.  Patient has been continuing Decadron 4 days a week Lab Results  Component Value Date   WBC 5.3 05/21/2019   HGB 10.4 (L) 05/21/2019   HCT 33.9 (L) 05/21/2019   MCV 108.0 (H) 05/21/2019   PLT 182 05/21/2019  A/P: On last CBC neutropenia showed full resolution-continue to  follow with oncology. For protein calorie malnutrition-patient has fortunately finally reversed trend on weight loss- we opted to decrease Decadron to 3 days a week and possibly further decrease at follow-up.  # Diabetes S: Most recently controlled without medication.  Lab Results  Component Value Date   HGBA1C 6.0 (H) 01/25/2019   HGBA1C 5.4 03/10/2018   HGBA1C 6.6 12/28/2017   A/P: Hopeful this remains controlled-would like to get an A1c next time we do blood work given significant weight gain.   # Depression S: Taking Citalopram 20 mg daily.  A/P: Reasonable control based off last PHQ 9- given COVID-19 pandemic and his recent illnesses we opted to continue at current dose for now.   # Pulmonary Embolism  S:Compliant with Eliquis.  We reviewed medication reconciliation which stated to hold medicine but patient had confirmed with oncology that he should continue- they had reached out to [name] A/P:  Stable. Continue current medications.     # Hypomagnesemia Taking Magnesium BID.  Can repeat next time we order labs  # Macrocytosis Taking B12 Complex MV.  Nelda Bucks macrocytic-continue oncology follow-up Lab Results  Component Value Date   WBC 5.3 05/21/2019   HGB 10.4 (L) 05/21/2019   HCT 33.9 (L) 05/21/2019   MCV 108.0 (H) 05/21/2019   PLT 182 05/21/2019   Recommended follow up: 4-8 week follow up-possibly in person to update labs Future Appointments  Date Time Provider [place]  05/24/2019  9:00 AM [facility]-CT 2 [facility]-CT [place]  05/25/2019  9:10 AM Milus Banister, MD [facility]  05/31/2019  8:00 AM [facility] LAB ONLY [facility] None  05/31/2019  8:40 AM [name], MD [facility] None   Lab/Order associations:   ICD-10-CM   1. Intra-abdominal abscess (HCC)  K65.1 CT Abdomen Pelvis W Contrast  2. Controlled type 2 diabetes mellitus without complication, without long-term current use of insulin (HCC)  E11.9   3. Essential hypertension  I10   4.  Hyperlipidemia, unspecified hyperlipidemia type  E78.5   5. Depression, major, single episode, complete remission ([place])  F32.5   6. Neutropenia, unspecified type ([place])  D70.9   7. Abnormal weight loss  R63.4 CT Abdomen Pelvis W Contrast   Return precautions advised.  Garret Reddish, MD

## 2019-05-20 IMAGING — CR DG ABDOMEN 1V
3 series · 3 of 3 positions shown · non-contrast
Comparison: 07/17/2018.

CLINICAL DATA: Abdominal distention and excess gas.

EXAM:
ABDOMEN - 1 VIEW

[t abdomen supine * (1 of 3)]
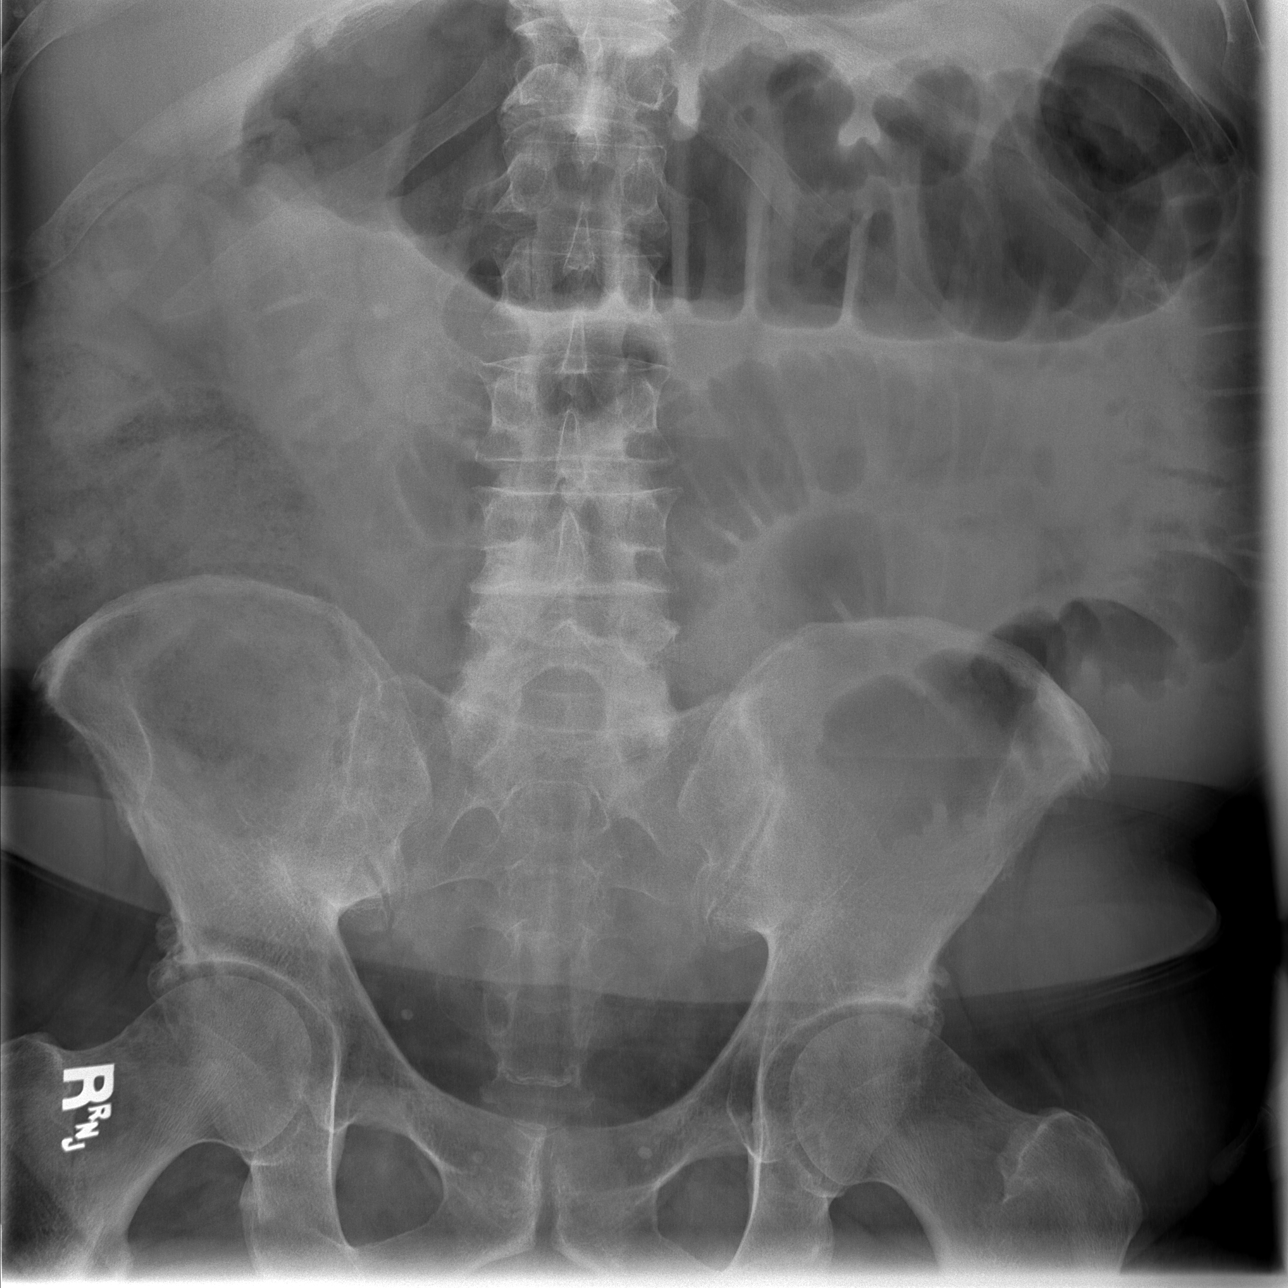

[t abdomen supine * (2 of 3)]
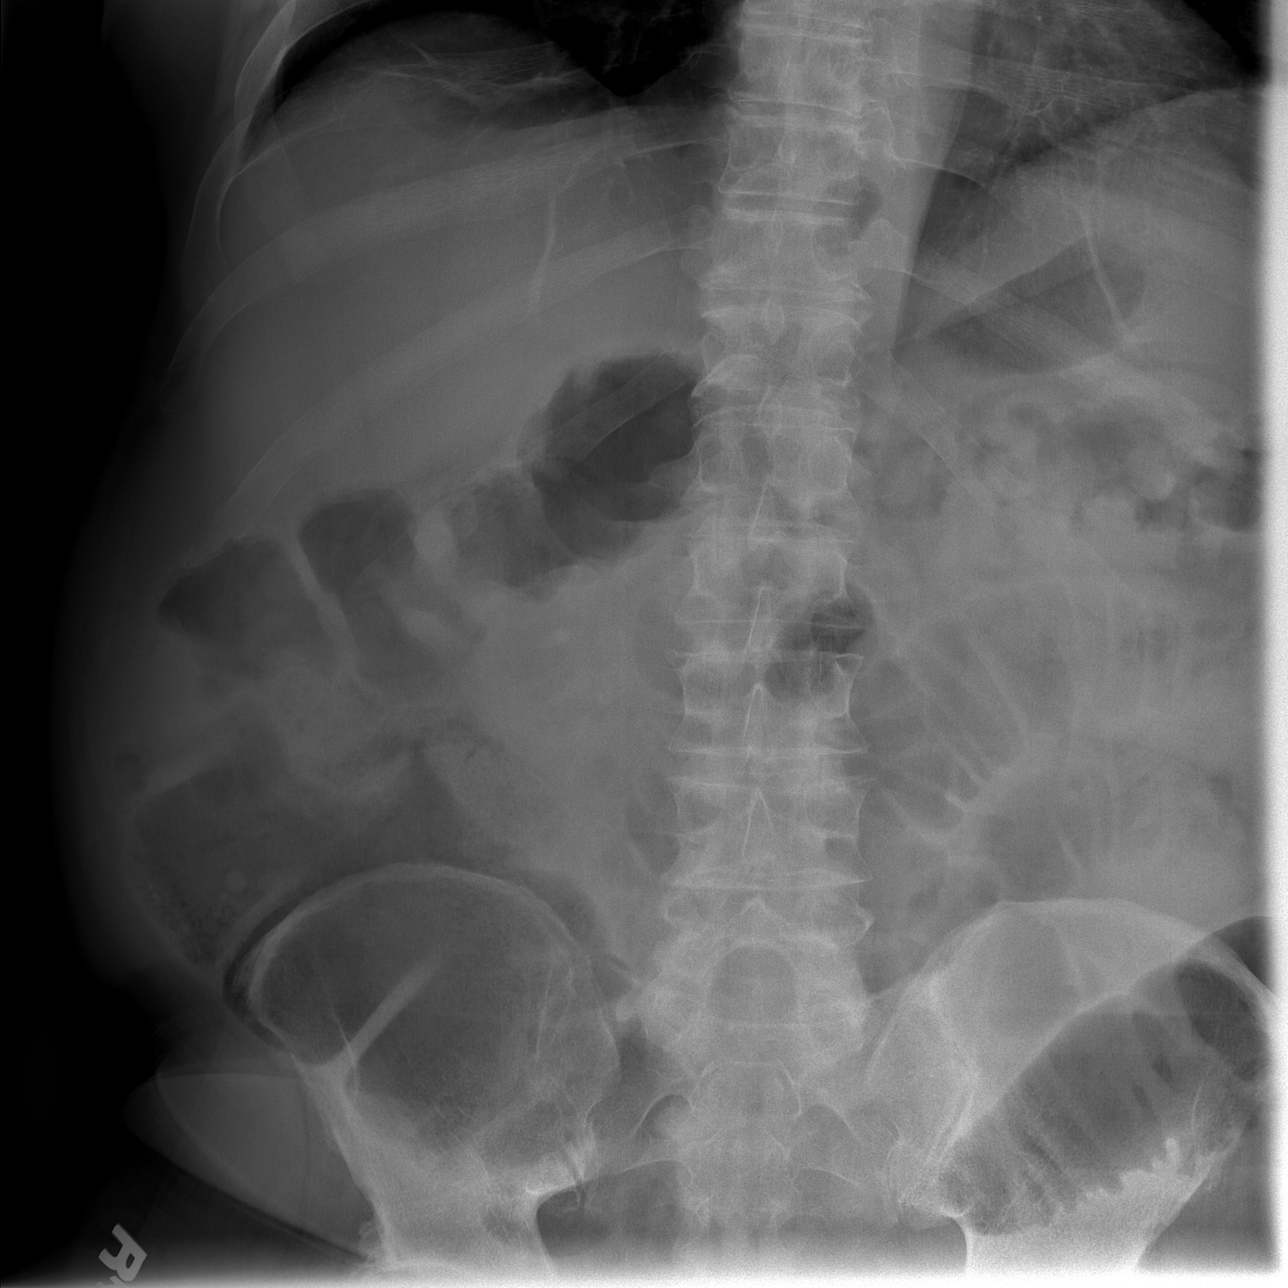

[t abdomen supine * (3 of 3)]
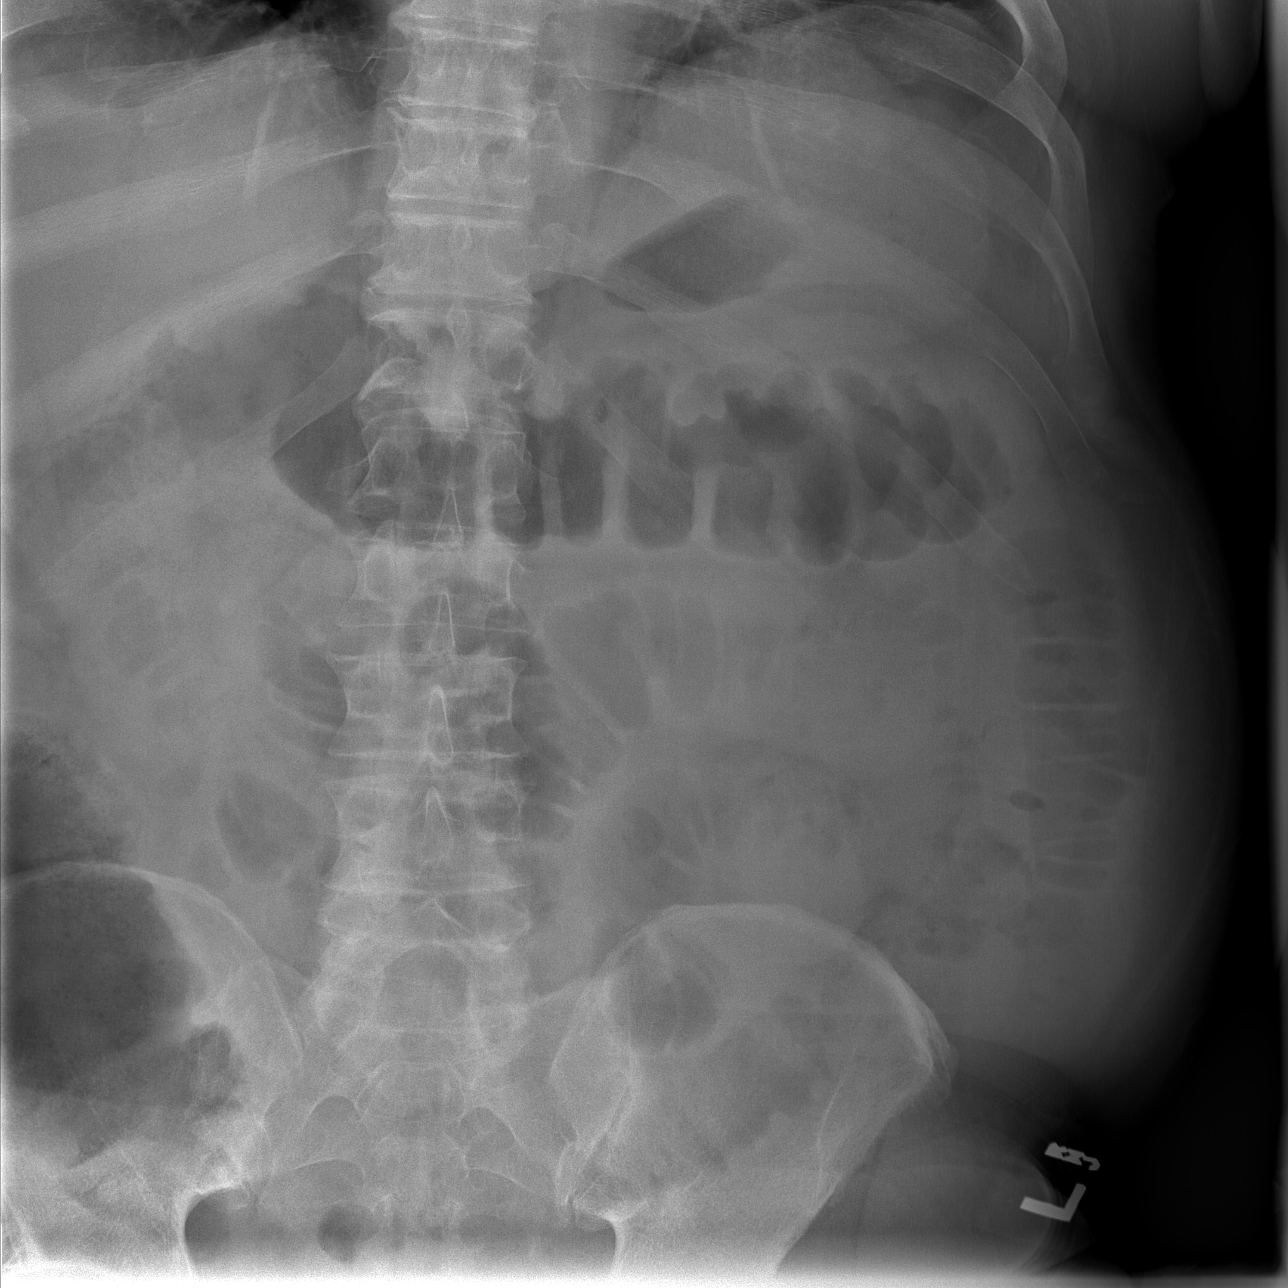

[3 of 3 positions shown; findings below may reference images not displayed]

FINDINGS: Interval dilated small bowel loops. There is also an interval
nodular mucosal pattern involving the filled components of the
transverse, distal descending and proximal sigmoid colon. No gross
free peritoneal air. Lower thoracic and mild lumbar spine
degenerative changes.
IMPRESSION: 1. Interval partial small bowel obstruction.
2. Interval nodular mucosal pattern involving the transverse, distal
descending and proximal sigmoid colon. This could be due to
infectious or inflammatory colitis.

## 2019-05-21 ENCOUNTER — Other Ambulatory Visit (HOSPITAL_COMMUNITY)
Admission: RE | Admit: 2019-05-21 | Discharge: 2019-05-21 | Disposition: A | Discharge status: Home / Self Care | Payer: BC Managed Care – PPO | Source: Other Acute Inpatient Hospital | Attending: Internal Medicine | Admitting: Internal Medicine

## 2019-05-21 DIAGNOSIS — K651 Peritoneal abscess: Secondary | ICD-10-CM | POA: Insufficient documentation

## 2019-05-21 DIAGNOSIS — R7881 Bacteremia: Secondary | ICD-10-CM | POA: Diagnosis not present

## 2019-05-21 DIAGNOSIS — K5100 Ulcerative (chronic) pancolitis without complications: Secondary | ICD-10-CM | POA: Diagnosis not present

## 2019-05-21 LAB — CBC WITH DIFFERENTIAL/PLATELET
Abs Immature Granulocytes: 0.03 10*3/uL (ref 0.00–0.07)
Basophils Absolute: 0 10*3/uL (ref 0.0–0.1)
Basophils Relative: 1 %
Eosinophils Absolute: 0 10*3/uL (ref 0.0–0.5)
Eosinophils Relative: 0 %
HCT: 33.9 % — ABNORMAL LOW (ref 39.0–52.0)
Hemoglobin: 10.4 g/dL — ABNORMAL LOW (ref 13.0–17.0)
Immature Granulocytes: 1 %
Lymphocytes Relative: 25 %
Lymphs Abs: 1.3 10*3/uL (ref 0.7–4.0)
MCH: 33.1 pg (ref 26.0–34.0)
MCHC: 30.7 g/dL (ref 30.0–36.0)
MCV: 108 fL — ABNORMAL HIGH (ref 80.0–100.0)
Monocytes Absolute: 0.6 10*3/uL (ref 0.1–1.0)
Monocytes Relative: 11 %
Neutro Abs: 3.3 10*3/uL (ref 1.7–7.7)
Neutrophils Relative %: 62 %
Platelets: 182 10*3/uL (ref 150–400)
RBC: 3.14 MIL/uL — ABNORMAL LOW (ref 4.22–5.81)
RDW: 18.5 % — ABNORMAL HIGH (ref 11.5–15.5)
WBC: 5.3 10*3/uL (ref 4.0–10.5)
nRBC: 0 % (ref 0.0–0.2)

## 2019-05-21 LAB — BASIC METABOLIC PANEL
Anion gap: 12 (ref 5–15)
BUN: 13 mg/dL (ref 8–23)
CO2: 25 mmol/L (ref 22–32)
Calcium: 8.8 mg/dL — ABNORMAL LOW (ref 8.9–10.3)
Chloride: 103 mmol/L (ref 98–111)
Creatinine, Ser: 0.6 mg/dL — ABNORMAL LOW (ref 0.61–1.24)
GFR calc Af Amer: >60 mL/min (ref >60)
GFR calc non Af Amer: >60 mL/min (ref >60)
Glucose, Bld: 157 mg/dL — ABNORMAL HIGH (ref 70–99)
Potassium: 3.8 mmol/L (ref 3.5–5.1)
Sodium: 140 mmol/L (ref 135–145)

## 2019-05-22 ENCOUNTER — Ambulatory Visit (INDEPENDENT_AMBULATORY_CARE_PROVIDER_SITE_OTHER): Payer: BC Managed Care – PPO | Admitting: Family Medicine

## 2019-05-22 VITALS — Temp 97.4°F | Ht 69.0 in | Wt 184.0 lb

## 2019-05-22 DIAGNOSIS — K651 Peritoneal abscess: Secondary | ICD-10-CM

## 2019-05-22 DIAGNOSIS — Z932 Ileostomy status: Secondary | ICD-10-CM | POA: Diagnosis not present

## 2019-05-22 DIAGNOSIS — F325 Major depressive disorder, single episode, in full remission: Secondary | ICD-10-CM

## 2019-05-22 DIAGNOSIS — R634 Abnormal weight loss: Secondary | ICD-10-CM

## 2019-05-22 DIAGNOSIS — R269 Unspecified abnormalities of gait and mobility: Secondary | ICD-10-CM | POA: Diagnosis not present

## 2019-05-22 DIAGNOSIS — E785 Hyperlipidemia, unspecified: Secondary | ICD-10-CM | POA: Diagnosis not present

## 2019-05-22 DIAGNOSIS — E119 Type 2 diabetes mellitus without complications: Secondary | ICD-10-CM

## 2019-05-22 DIAGNOSIS — C162 Malignant neoplasm of body of stomach: Secondary | ICD-10-CM | POA: Diagnosis not present

## 2019-05-22 DIAGNOSIS — D709 Neutropenia, unspecified: Secondary | ICD-10-CM

## 2019-05-23 ENCOUNTER — Telehealth: Payer: Self-pay | Admitting: Physical Therapy

## 2019-05-23 DIAGNOSIS — R269 Unspecified abnormalities of gait and mobility: Secondary | ICD-10-CM | POA: Diagnosis not present

## 2019-05-23 DIAGNOSIS — C162 Malignant neoplasm of body of stomach: Secondary | ICD-10-CM | POA: Diagnosis not present

## 2019-05-23 IMAGING — DX DG ABDOMEN 1V
1 series · 1 of 1 positions shown · non-contrast
Comparison: 07/21/2018, radiographs and CT.

CLINICAL DATA: Follow-up small bowel obstruction.

EXAM:
ABDOMEN - 1 VIEW

[abdomen kub]
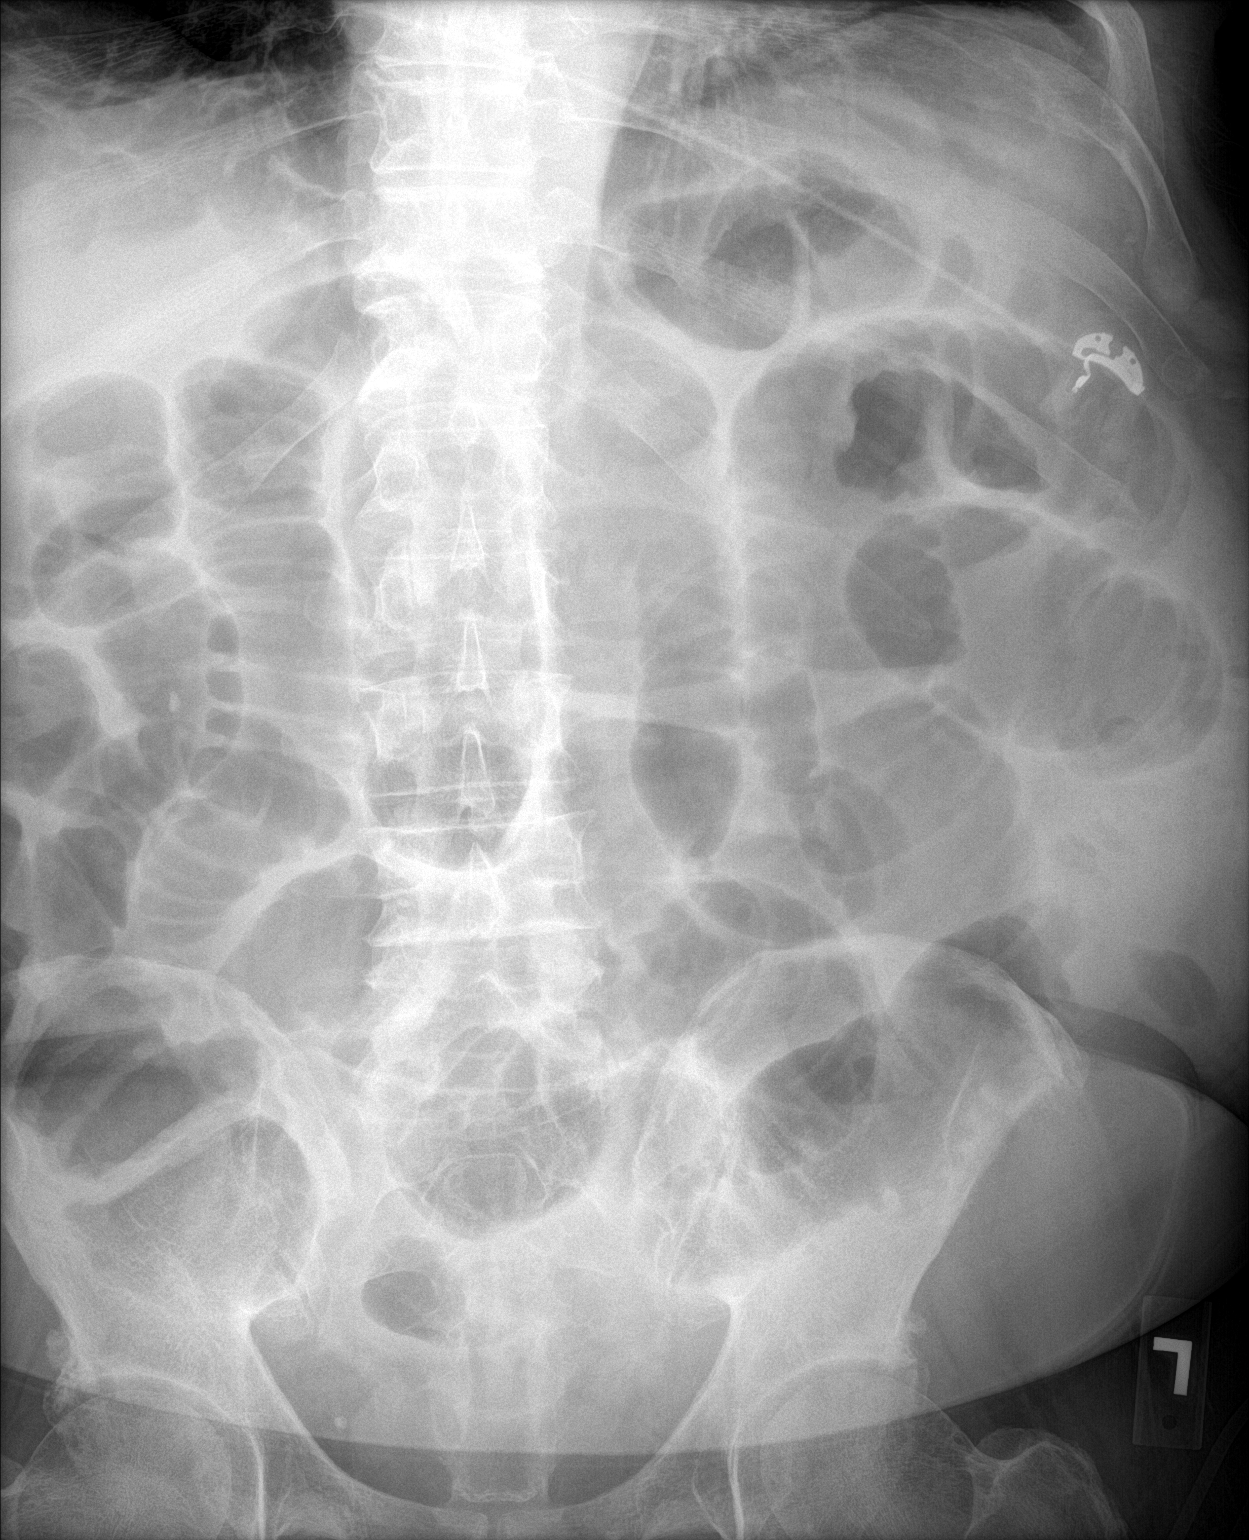

[1 of 1 positions shown; findings below may reference images not displayed]

FINDINGS: There is persistent dilation of the small bowel with minimal colonic
air, findings similar to the prior exam, consistent with a
high-grade partial small bowel obstruction.
IMPRESSION: 1. No significant change. Persistent high-grade small bowel
obstruction.

## 2019-05-23 NOTE — Telephone Encounter (Signed)
Called pt and left VM to call the office.   Message sent via [place].

## 2019-05-23 NOTE — Telephone Encounter (Signed)
Wife contacted by Colletta [place] and made aware or CT appointment time and location.

## 2019-05-23 NOTE — Telephone Encounter (Signed)
Copied from [place] [phone]. Topic: Quick Communication - See Telephone Encounter >> May 23, 2019  1:40 PM [place] wrote: CRM for notification. See Telephone encounter for: 05/23/19. Pt had an appt and was told to call office by noon today if had not heard back from Dr Yong Channel in regards to conversations with other drs. Please call [name] back at [phone]

## 2019-05-23 NOTE — Telephone Encounter (Signed)
This was about his CT scan which I contacted Dr. Baxter Flattery about.  She did want [place] to try to get a CT scan set up tomorrow-Stephanie is working on this to help get it scheduled.  You can update him that Dr. Baxter Flattery does want the scan and I have ordered this as stat with hopes to complete it by tomorrow

## 2019-05-23 NOTE — Telephone Encounter (Signed)
Not sure what this is pertaining to as I was out of the office yesterday. Forwarding to Dr. Yong Channel.

## 2019-05-24 ENCOUNTER — Ambulatory Visit (HOSPITAL_COMMUNITY)
Admission: RE | Admit: 2019-05-24 | Discharge: 2019-05-24 | Disposition: A | Discharge status: Home / Self Care | Payer: BC Managed Care – PPO | Source: Ambulatory Visit | Attending: Family Medicine | Admitting: Family Medicine

## 2019-05-24 ENCOUNTER — Telehealth: Payer: Self-pay | Admitting: Physical Therapy

## 2019-05-24 ENCOUNTER — Other Ambulatory Visit: Payer: Self-pay

## 2019-05-24 DIAGNOSIS — R634 Abnormal weight loss: Secondary | ICD-10-CM | POA: Insufficient documentation

## 2019-05-24 DIAGNOSIS — K529 Noninfective gastroenteritis and colitis, unspecified: Secondary | ICD-10-CM | POA: Diagnosis not present

## 2019-05-24 DIAGNOSIS — K651 Peritoneal abscess: Secondary | ICD-10-CM | POA: Diagnosis not present

## 2019-05-24 MED ORDER — IOHEXOL 300 MG/ML  SOLN
100.0000 mL | Freq: Once | INTRAMUSCULAR | Status: AC | PRN
  Administered 2019-05-24: 10:00:00 100 mL via INTRAVENOUS

## 2019-05-24 MED ORDER — SODIUM CHLORIDE (PF) 0.9 % IJ SOLN
INTRAMUSCULAR | Status: AC
  Filled 2019-05-24: qty 50

## 2019-05-24 NOTE — Telephone Encounter (Signed)
Copied from [place] [phone]. Topic: General - Call Back - No Documentation >> May 24, 2019  1:00 PM Erick Blinks wrote: Requesting call back to discuss CT scan results. Please advise  Best contact: [phone]

## 2019-05-24 NOTE — Telephone Encounter (Signed)
We need Dr. Baxter Flattery to weigh in on antibiotic discussion.  Team please call infectious disease office if we do not hear about the [place] her on this

## 2019-05-24 NOTE — Telephone Encounter (Signed)
Called and spoke with [name]. She is aware of note on CT result from Dr. Yong Channel. CT results forwarded to Dr. Carlyle Basques via [place]. [name] says that [place] has an appointment with Dr. Ardis Hughs in the morning but would like to know ASAP about the abx because it has to get delivered through a speciality pharmacy and today was his last dose.   Forwarding to Dr. Yong Channel to review tomorrow morning.

## 2019-05-25 ENCOUNTER — Encounter: Payer: Self-pay | Admitting: Gastroenterology

## 2019-05-25 ENCOUNTER — Ambulatory Visit: Payer: BC Managed Care – PPO | Admitting: Gastroenterology

## 2019-05-25 ENCOUNTER — Other Ambulatory Visit: Payer: Self-pay | Admitting: Internal Medicine

## 2019-05-25 ENCOUNTER — Ambulatory Visit (INDEPENDENT_AMBULATORY_CARE_PROVIDER_SITE_OTHER): Payer: BC Managed Care – PPO | Admitting: Gastroenterology

## 2019-05-25 VITALS — BP 96/70 | HR 88 | Temp 98.1°F | Ht 69.0 in | Wt 189.4 lb

## 2019-05-25 DIAGNOSIS — K56699 Other intestinal obstruction unspecified as to partial versus complete obstruction: Secondary | ICD-10-CM | POA: Diagnosis not present

## 2019-05-25 DIAGNOSIS — K651 Peritoneal abscess: Secondary | ICD-10-CM | POA: Diagnosis not present

## 2019-05-25 DIAGNOSIS — K5100 Ulcerative (chronic) pancolitis without complications: Secondary | ICD-10-CM | POA: Diagnosis not present

## 2019-05-25 DIAGNOSIS — K520 Gastroenteritis and colitis due to radiation: Secondary | ICD-10-CM | POA: Diagnosis not present

## 2019-05-25 DIAGNOSIS — R269 Unspecified abnormalities of gait and mobility: Secondary | ICD-10-CM | POA: Diagnosis not present

## 2019-05-25 DIAGNOSIS — R7881 Bacteremia: Secondary | ICD-10-CM | POA: Diagnosis not present

## 2019-05-25 DIAGNOSIS — C162 Malignant neoplasm of body of stomach: Secondary | ICD-10-CM | POA: Diagnosis not present

## 2019-05-25 MED ORDER — METRONIDAZOLE 500 MG PO TABS: 500.0000 mg | ORAL_TABLET | Freq: Three times a day (TID) | ORAL | 0 refills | Status: DC

## 2019-05-25 NOTE — Patient Instructions (Signed)
You will receive a call today from [facility]   Your oral and IV antibiotics will be refilled today by Dr Graylon Good  Thank you for entrusting me with your care and choosing [facility].  Dr Ardis Hughs

## 2019-05-25 NOTE — Progress Notes (Signed)
Spoke with dr Ardis Hughs who is seeing patient today who feels that repeat CT still has evidence of intra-abdominal abscess. Patient will likely need surgical evaluation for consideration of resection of radiation induced injury to segment of colon associated with infection.  Plan to extend abtx for addn 3 wk with ceftriaxone plus oral metronidazole.  Spoke with [facility] to arrange new delivery of abtx

## 2019-05-25 NOTE — Progress Notes (Signed)
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 There is partial encasement of the sigmoid colon by the mass. There is prominent extension of the mass into the right lower retroperitoneum and extraperitoneal right pelvis with encasement of the right external iliac and right common iliac vasculature and with infiltration of the right iliopsoas muscle":Chemotherapy until about November 2019. Dramatic improvement in tumor size, burden. He presented with bowel obstruction December 2019 colonoscopy (to the cecum) by 1 of my partners which proved 10 cm long sigmoid stenosis. Bowel is otherwise normal. He underwent a loop ileostomy Dr. Greer Pickerel December 2019 and in the op report Dr. Redmond Pulling described a woody texture to his cecum, terminal ileum as well as I believe the sigmoid region.  2. Acute diffuse colitis 12/2018 (admitted several days in hospital ) CT scan c/w acute diffuse colitis, maybe radiation vrs diversion colitis.Infectious workup including c-diff, gi path panel, CMV, EBV all negative. Had improvement withshort chain fatty acid enemas but stopped due to neutropenia. Cholestyramine daily may have helped.   3. 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    HPI: This is a very pleasant 63 year old man whom I last saw by telemedicine visit about 4 months ago.  Since then he has been hospitalized 2 or 3 times with sepsis.  Most recently he was found to have E. coli bacteremia.  This is almost certainly related to his colonic disease.  He has been on oral and IV antibiotics since then and seems to be doing well.   Most recent CT scan (this week). 1. Overall little interval change from CT 05/09/2019. Persistent inflamed loop of distal sigmoid colon with submucosal edema. Potential  fistulous communication versus tethering to the ascending colon and a loop of small bowel. Findings are suggestive of inflammatory bowel disease. 2. No evidence of bowel obstruction. Oral contrast enters the ileostomy bag with bowel obstruction or small bowel dilatation.  Colonoscopy 02/2019: Found a severe stricture in the sigmoid colon associated with severe inflammation.  This stricture was unable to be passed.  Biopsies suggest inflammation with a differential that the pathologist recommended work including inflammatory bowel disease, ischemic, infection.  1 of the limbs of his loop ileostomy appeared inflamed, and the other was normal.  Biopsies from both limbs suggested acute and chronic inflammation.  Chief complaint is abdominal colon, Recurrent bacteremia, sepsis  ROS: complete GI ROS as described in HPI, all other review negative.  Constitutional:  No unintentional weight loss   Past Medical History:  Diagnosis Date  . ALLERGIC RHINITIS   . Cancer ([place])    Lymphoma   . Diabetes mellitus   . Hyperlipidemia     Past Surgical History:  Procedure Laterality Date  . BIOPSY  03/15/2018   Procedure: BIOPSY;  Surgeon: Milus Banister, MD;  Location: [name];  Service: Endoscopy;;  . BIOPSY  03/01/2019   Procedure: BIOPSY;  Surgeon: Ladene Artist, MD;  Location: [facility];  Service: Endoscopy;;  . BOWEL RESECTION N/A 08/25/2018   Procedure: LAPROSCOPIC LOOP ILEOSTOMY;  Surgeon: Greer Pickerel, MD;  Location: [facility];  Service: General;  Laterality: N/A;  . CLEFT PALATE REPAIR    . COLONOSCOPY N/A 03/15/2018   Procedure: COLONOSCOPY;  Surgeon: Milus Banister, MD;  Location: [facility];  Service: Endoscopy;  Laterality: N/A;  . COLONOSCOPY N/A 08/20/2018   Procedure:  COLONOSCOPY;  Surgeon: Irene Shipper, MD;  Location: [name];  Service: Endoscopy;  Laterality: N/A;  . deviated septum repair     slight improvement  . ESOPHAGOGASTRODUODENOSCOPY N/A 03/15/2018    Procedure: ESOPHAGOGASTRODUODENOSCOPY (EGD);  Surgeon: Milus Banister, MD;  Location: [name];  Service: Endoscopy;  Laterality: N/A;  . FLEXIBLE SIGMOIDOSCOPY N/A 03/01/2019   Procedure: FLEXIBLE SIGMOIDOSCOPY;  Surgeon: Ladene Artist, MD;  Location: [facility];  Service: Endoscopy;  Laterality: N/A;  . ILEOSCOPY N/A 03/01/2019   Procedure: ILEOSCOPY THROUGH STOMA;  Surgeon: Ladene Artist, MD;  Location: [facility];  Service: Endoscopy;  Laterality: N/A;  . IR IMAGING GUIDED PORT INSERTION  03/17/2018  . LAPAROSCOPY N/A 08/25/2018   Procedure: LAPAROSCOPY DIAGNOSTIC;  Surgeon: Greer Pickerel, MD;  Location: [facility];  Service: General;  Laterality: N/A;  . TONSILLECTOMY      Current Outpatient Medications  Medication Sig Dispense Refill  . acetaminophen (TYLENOL) 325 MG tablet Take 2 tablets (650 mg total) by mouth every 6 (six) hours as needed for mild pain (or Fever >/= 101). 30 tablet 0  . apixaban (ELIQUIS) 5 MG TABS tablet DO NOT TAKE UNTIL SEEN BY YOUR ONCOLOGIST (Patient taking differently: Take 5 mg by mouth 2 (two) times daily. DO NOT TAKE UNTIL SEEN BY YOUR ONCOLOGIST) 60 tablet 3  . B Complex Vitamins (VITAMIN B COMPLEX PO) Take by mouth.    . blood glucose meter kit and supplies Dispense based on patient and insurance preference. Use daily as directed. (E11.9). 1 each 0  . citalopram (CELEXA) 20 MG tablet Take 1 tablet (20 mg total) by mouth daily. 90 tablet 3  . dexamethasone (DECADRON) 1 MG tablet Take 1 tablet (1 mg total) by mouth every morning. (Patient taking differently: Take 1 mg by mouth. Mon, Wed, Friday) 30 tablet 2  . dronabinol (MARINOL) 5 MG capsule Take 1 capsule (5 mg total) by mouth 2 (two) times daily before lunch and supper. (Patient taking differently: Take 5 mg by mouth daily. ) 60 capsule 0  . glucose blood (FREESTYLE TEST STRIPS) test strip Use to check blood sugar daily 100 each 4  . HYDROcodone-acetaminophen (NORCO) 10-325 MG tablet Take 1 tablet by  mouth every 6 (six) hours as needed for moderate pain or severe pain. 30 tablet 0  . magnesium oxide (MAG-OX) 400 (241.3 Mg) MG tablet Take 1 tablet (400 mg total) by mouth 2 (two) times daily. 60 tablet 0  . Zinc Sulfate 220 (50 Zn) MG TABS Take 1 tablet by mouth 2 (two) times daily.     No current facility-administered medications for this visit.     Allergies as of 05/25/2019 - Review Complete 05/25/2019  Allergen Reaction Noted  . Ciprofloxacin Other (See Comments) 01/30/2018    Family History  Problem Relation Age of Onset  . Lung cancer Mother        smoker  . Brain cancer Mother        metastasis  . AAA (abdominal aortic aneurysm) Father        smoker    Social History   Socioeconomic History  . Marital status: Married    Spouse name: lisa  . Number of children: 0  . Years of education: Not on file  . Highest education level: Not on file  Occupational History  . Not on file  Social Needs  . Financial resource strain: Not hard at all  . Food insecurity    Worry: Never true  Inability: Never true  . Transportation needs    Medical: No    Non-medical: No  Tobacco Use  . Smoking status: Never Smoker  . Smokeless tobacco: Never Used  Substance and Sexual Activity  . Alcohol use: Yes    Comment: occasional  . Drug use: No  . Sexual activity: Yes  Lifestyle  . Physical activity    Days per week: 0 days    Minutes per session: 0 min  . Stress: Not at all  Relationships  . Social connections    Talks on phone: More than three times a week    Gets together: More than three times a week    Attends religious service: 1 to 4 times per year    Active member of club or organization: No    Attends meetings of clubs or organizations: Never    Relationship status: Married  . Intimate partner violence    Fear of current or ex partner: No    Emotionally abused: No    Physically abused: No    Forced sexual activity: No  Other Topics Concern  . Not on file   Social History Narrative   Married 1985. No kids. 4 small dogs.       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 male with significant colonic disease since treatment for abdominal Burkitt's lymphoma late last year.  In my opinion he has been suffering for several months from radiation related damage to his bowel.  All of his bowel symptoms started after his radiation, chemotherapy treatment for his large abdominal Burkitt's lymphoma.  He has a severe stricture in his sigmoid colon that has been documented twice by colonoscopy and on several CT scans.  He has undergone 10 CT scans in 2020.  I think that this severe radiation colitis segment is causing stricturing, breakdown, possible fistula, nearby abscesses.  We have recommended while he was hospitalized that he have at least a segmental colectomy.  I strongly recommend that be considered again.    I do not think that he has inflammatory bowel disease.  I have spoken with Karolee Ohs from infectious disease and she agrees with this plan.  He has run out of oral and IV antibiotics as of today and I think it is in his interest to continue antibiotics until he has this segment of colon removed.  She agrees and her staff will call in oral and IV antibiotics to continue for at least another 2 or 3 weeks.  I spoke with Dr. Alphonsa Overall who is at [facility] surgery.  He knows him from a previous admission although he is not the  patient's "point person" from their group.  We discussed the situation briefly and he will help to expedite evaluation at their office hopefully sometime next week.  Please see the "Patient Instructions" section for addition details about the plan.  Owens Loffler, MD [facility] 05/25/2019, 9:21 AM

## 2019-05-25 NOTE — Telephone Encounter (Signed)
Spoke to A nurse at infectious control and she stated that Dr. Baxter Flattery decided to put pt on IV antibiotics for 3 days.

## 2019-05-28 ENCOUNTER — Other Ambulatory Visit (HOSPITAL_COMMUNITY)
Admission: RE | Admit: 2019-05-28 | Discharge: 2019-05-28 | Disposition: A | Discharge status: Home / Self Care | Payer: BC Managed Care – PPO | Source: Other Acute Inpatient Hospital | Attending: Internal Medicine | Admitting: Internal Medicine

## 2019-05-28 DIAGNOSIS — R269 Unspecified abnormalities of gait and mobility: Secondary | ICD-10-CM | POA: Diagnosis not present

## 2019-05-28 DIAGNOSIS — K651 Peritoneal abscess: Secondary | ICD-10-CM | POA: Insufficient documentation

## 2019-05-28 DIAGNOSIS — R7881 Bacteremia: Secondary | ICD-10-CM | POA: Diagnosis not present

## 2019-05-28 DIAGNOSIS — K5100 Ulcerative (chronic) pancolitis without complications: Secondary | ICD-10-CM | POA: Diagnosis not present

## 2019-05-28 DIAGNOSIS — C162 Malignant neoplasm of body of stomach: Secondary | ICD-10-CM | POA: Diagnosis not present

## 2019-05-28 LAB — CBC WITH DIFFERENTIAL/PLATELET
Abs Immature Granulocytes: 0.01 10*3/uL (ref 0.00–0.07)
Basophils Absolute: 0 10*3/uL (ref 0.0–0.1)
Basophils Relative: 1 %
Eosinophils Absolute: 0.1 10*3/uL (ref 0.0–0.5)
Eosinophils Relative: 1 %
HCT: 34.4 % — ABNORMAL LOW (ref 39.0–52.0)
Hemoglobin: 11 g/dL — ABNORMAL LOW (ref 13.0–17.0)
Immature Granulocytes: 0 %
Lymphocytes Relative: 31 %
Lymphs Abs: 1.5 10*3/uL (ref 0.7–4.0)
MCH: 34.5 pg — ABNORMAL HIGH (ref 26.0–34.0)
MCHC: 32 g/dL (ref 30.0–36.0)
MCV: 107.8 fL — ABNORMAL HIGH (ref 80.0–100.0)
Monocytes Absolute: 0.9 10*3/uL (ref 0.1–1.0)
Monocytes Relative: 19 %
Neutro Abs: 2.3 10*3/uL (ref 1.7–7.7)
Neutrophils Relative %: 48 %
Platelets: 214 10*3/uL (ref 150–400)
RBC: 3.19 MIL/uL — ABNORMAL LOW (ref 4.22–5.81)
RDW: 18.6 % — ABNORMAL HIGH (ref 11.5–15.5)
WBC: 4.7 10*3/uL (ref 4.0–10.5)
nRBC: 0 % (ref 0.0–0.2)

## 2019-05-28 LAB — BASIC METABOLIC PANEL
Anion gap: 10 (ref 5–15)
BUN: 11 mg/dL (ref 8–23)
CO2: 26 mmol/L (ref 22–32)
Calcium: 8.7 mg/dL — ABNORMAL LOW (ref 8.9–10.3)
Chloride: 103 mmol/L (ref 98–111)
Creatinine, Ser: 0.59 mg/dL — ABNORMAL LOW (ref 0.61–1.24)
GFR calc Af Amer: >60 mL/min (ref >60)
GFR calc non Af Amer: >60 mL/min (ref >60)
Glucose, Bld: 88 mg/dL (ref 70–99)
Potassium: 4 mmol/L (ref 3.5–5.1)
Sodium: 139 mmol/L (ref 135–145)

## 2019-05-29 NOTE — Progress Notes (Signed)
HEMATOLOGY/ONCOLOGY CLINIC NOTE  Date of Service: 05/29/19    Patient Care Team: [name], MD as PCP - General (Family Medicine)  CHIEF COMPLAINTS/PURPOSE OF CONSULTATION:  F/u for continued Mx of Burkitts lymphoma  HISTORY OF PRESENTING ILLNESS:   Mark Frey is a wonderful 63 y.o. male who has been referred to [place] by my colleague Dr. 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 He is accompanied today by his wife. The pt reports that he is doing well overall.   The pt started R-EPOCH every 3 weeks with neulasta support on 03/17/18. He presented to the ED with right leg swelling, found to be a DVT and bilateral PE, which resulted in the incidental finding of a lower abdomen tumor measuring 18.7 x 18.5 x 17.8 cm. He denies any abdominal pains or changes in his bowel habits prior to this finding. He first noted blood in the stools 4-5 days prior to appearing to the ED.  The pt reports that he has been taking 159m Lovenox injections once each day, except for the days in which he had blood in the stools. He hasn't had blood in his stools for the last 7 days.   He has had GI bleed related to bowel involvement with his lymphoma - this is now resolving and his hgb is stabilizing.  He notes that his scrotum and leg swelling has decreased. He denies having CP or SOB at any point from his PE.   Most recent lab results (04/06/18) of CBC w/diff, CMP, Reticulocytes  is as follows: all values are WNL except for WBC at 15.3k, RBC at 3.65, HGB at 10.5, HCT at 31.4, RDW at 17.8, PLT at 640k, ANC at 12.7k, Monocytes abs at 1.6k, Glucose at 162, Total Protein at 6.2, Albumin at 3.1, Total bilirubin at <0.2, Retic ct pct at 2.5%. Uric acid 04/06/18 was low at 3.0 LDH 04/06/18 elevated at 251  On review of systems, pt reports remaining right leg swelling, blood in the stools 7 days ago, two  bowel movements each day, left leg swelling, improved scrotal swelling, eating well, and denies problems with his port, abdominal pains, constipation, diarrhea, jaw pain, pain along the spine, problems passing urine, and any other symptoms.  Interval History:   Mark BAUTCHwas seen in f/u of his Burkitt's lymphoma. The patient's last [place] on 03[place], though he was seen in the hospital on 05/10/2019. The pt reports that he is doing well overall. The pt was accompanied by Mark Frey his wife, by phone.  [name] so he needs to schedule an appointment with a surgeon. He uses a rollator at home and a PT comes to his house 3x/wk. He notes that he is staying as active as possible. Denies fevers, chills, and night sweats.   Of note since the patient's last visit, pt has had CT CAP completed on 05/24/2019 with results revealing "1. Overall little interval change from CT 05/09/2019. Persistent inflamed loop of distal sigmoid colon with submucosal edema. Potential fistulous communication versus tethering to the ascending colon and a loop of small bowel. Findings are suggestive of inflammatory bowel disease. 2. No evidence of bowel obstruction. Oral contrast enters the ileostomy bag with bowel obstruction or small bowel dilatation."  Lab results today (05/31/2019) of CBC w/diff and CMP is as follows: all  values are WNL except for RBC at 3.56, HGB at 11.9, HCT at 38.2, MCV at 107.3, RDW at 18.5, glucose bld at 105, total bilirubin at <0.2. 05/31/2019 LDH at 116 05/31/2019 Magnesium at 1.4  On review of systems, pt reports no new concerns and denies fevers, chills, night sweats, and any other symptoms.   MEDICAL HISTORY:  Past Medical History:  Diagnosis Date   ALLERGIC RHINITIS    Cancer ([place])    Lymphoma    Diabetes mellitus    Hyperlipidemia     SURGICAL HISTORY: Past Surgical History:  Procedure Laterality Date   BIOPSY   03/15/2018   Procedure: BIOPSY;  Surgeon: Milus Banister, MD;  Location: [facility];  Service: Endoscopy;;   BIOPSY  03/01/2019   Procedure: BIOPSY;  Surgeon: Ladene Artist, MD;  Location: [facility];  Service: Endoscopy;;   BOWEL RESECTION N/A 08/25/2018   Procedure: LAPROSCOPIC LOOP ILEOSTOMY;  Surgeon: Greer Pickerel, MD;  Location: [name];  Service: General;  Laterality: N/A;   CLEFT PALATE REPAIR     COLONOSCOPY N/A 03/15/2018   Procedure: COLONOSCOPY;  Surgeon: Milus Banister, MD;  Location: [name];  Service: Endoscopy;  Laterality: N/A;   COLONOSCOPY N/A 08/20/2018   Procedure: COLONOSCOPY;  Surgeon: Irene Shipper, MD;  Location: [facility];  Service: Endoscopy;  Laterality: N/A;   deviated septum repair     slight improvement   ESOPHAGOGASTRODUODENOSCOPY N/A 03/15/2018   Procedure: ESOPHAGOGASTRODUODENOSCOPY (EGD);  Surgeon: Milus Banister, MD;  Location: [name];  Service: Endoscopy;  Laterality: N/A;   FLEXIBLE SIGMOIDOSCOPY N/A 03/01/2019   Procedure: FLEXIBLE SIGMOIDOSCOPY;  Surgeon: Ladene Artist, MD;  Location: [facility];  Service: Endoscopy;  Laterality: N/A;   ILEOSCOPY N/A 03/01/2019   Procedure: ILEOSCOPY THROUGH STOMA;  Surgeon: Ladene Artist, MD;  Location: [facility];  Service: Endoscopy;  Laterality: N/A;   IR IMAGING GUIDED PORT INSERTION  03/17/2018   LAPAROSCOPY N/A 08/25/2018   Procedure: LAPAROSCOPY DIAGNOSTIC;  Surgeon: Greer Pickerel, MD;  Location: [facility];  Service: General;  Laterality: N/A;   TONSILLECTOMY      SOCIAL HISTORY: Social History   Socioeconomic History   Marital status: Married    Spouse name: Mark Frey   Number of children: 0   Years of education: Not on file   Highest education level: Not on file  Occupational History   Not on file  Social Needs   Financial resource strain: Not hard at all   Food insecurity    Worry: Never true    Inability: Never true   Transportation needs    Medical: No     Non-medical: No  Tobacco Use   Smoking status: Never Smoker   Smokeless tobacco: Never Used  Substance and Sexual Activity   Alcohol use: Yes    Comment: occasional   Drug use: No   Sexual activity: Yes  Lifestyle   Physical activity    Days per week: 0 days    Minutes per session: 0 min   Stress: Not at all  Relationships   Social connections    Talks on phone: More than three times a week    Gets together: More than three times a week    Attends religious service: 1 to 4 times per year    Active member of club or organization: No    Attends meetings of clubs or organizations: Never    Relationship status: Married   Intimate partner violence    Fear  of current or ex partner: No    Emotionally abused: No    Physically abused: No    Forced sexual activity: No  Other Topics Concern   Not on file  Social History Narrative   Married 1985. No kids. 4 small dogs.       Works in Financial trader, residential      Hobbies: work on cars, Haematologist, exercise as able    FAMILY HISTORY: Family History  Problem Relation Age of Onset   Lung cancer Mother        smoker   Brain cancer Mother        metastasis   AAA (abdominal aortic aneurysm) Father        smoker    ALLERGIES:  is allergic to ciprofloxacin.  MEDICATIONS:  Current Outpatient Medications  Medication Sig Dispense Refill   acetaminophen (TYLENOL) 325 MG tablet Take 2 tablets (650 mg total) by mouth every 6 (six) hours as needed for mild pain (or Fever >/= 101). 30 tablet 0   apixaban (ELIQUIS) 5 MG TABS tablet DO NOT TAKE UNTIL SEEN BY YOUR ONCOLOGIST (Patient taking differently: Take 5 mg by mouth 2 (two) times daily. DO NOT TAKE UNTIL SEEN BY YOUR ONCOLOGIST) 60 tablet 3   B Complex Vitamins (VITAMIN B COMPLEX PO) Take by mouth.     blood glucose meter kit and supplies Dispense based on patient and insurance preference. Use daily as directed. (E11.9). 1 each 0   citalopram (CELEXA) 20 MG  tablet Take 1 tablet (20 mg total) by mouth daily. 90 tablet 3   dexamethasone (DECADRON) 1 MG tablet Take 1 tablet (1 mg total) by mouth every morning. (Patient taking differently: Take 1 mg by mouth. Mon, Wed, Friday) 30 tablet 2   dronabinol (MARINOL) 5 MG capsule Take 1 capsule (5 mg total) by mouth 2 (two) times daily before lunch and supper. (Patient taking differently: Take 5 mg by mouth daily. ) 60 capsule 0   glucose blood (FREESTYLE TEST STRIPS) test strip Use to check blood sugar daily 100 each 4   HYDROcodone-acetaminophen (NORCO) 10-325 MG tablet Take 1 tablet by mouth every 6 (six) hours as needed for moderate pain or severe pain. 30 tablet 0   magnesium oxide (MAG-OX) 400 (241.3 Mg) MG tablet Take 1 tablet (400 mg total) by mouth 2 (two) times daily. 60 tablet 0   metroNIDAZOLE (FLAGYL) 500 MG tablet Take 1 tablet (500 mg total) by mouth 3 (three) times daily. 63 tablet 0   Zinc Sulfate 220 (50 Zn) MG TABS Take 1 tablet by mouth 2 (two) times daily.     No current facility-administered medications for this visit.     REVIEW OF SYSTEMS:    A 10+ POINT REVIEW OF SYSTEMS WAS OBTAINED including neurology, dermatology, psychiatry, cardiac, respiratory, lymph, extremities, GI, GU, Musculoskeletal, constitutional, breasts, reproductive, HEENT.  All pertinent positives are noted in the HPI.  All others are negative.   PHYSICAL EXAMINATION: ECOG PERFORMANCE STATUS: 1 - Symptomatic but completely ambulatory  Vitals:   05/31/19 0838  BP: 105/71  Pulse: 87  Resp: 18  Temp: 98.3 F (36.8 C)  SpO2: 97%   Filed Weights   05/31/19 0838  Weight: 194 lb 3.2 oz (88.1 kg)   .Body mass index is 28.68 kg/m.  GENERAL:alert, in no acute distress and comfortable SKIN: no acute rashes, no significant lesions EYES: conjunctiva are pink and non-injected, sclera anicteric OROPHARYNX: MMM, no exudates, no oropharyngeal erythema or ulceration  NECK: supple, no JVD LYMPH:  no palpable  lymphadenopathy in the cervical, axillary or inguinal regions LUNGS: clear to auscultation b/l with normal respiratory effort HEART: regular rate & rhythm ABDOMEN:  normoactive bowel sounds, nontender, not distended. No palpable hepatosplenomegaly. Ileostomy in situ Extremity: +1 pedal edema PSYCH: alert & oriented x 3 with fluent speech NEURO: no focal motor/sensory deficits   LABORATORY DATA:   CBC Latest Ref Rng & Units 06/04/2019 05/31/2019 05/28/2019  WBC 4.0 - 10.5 K/uL 5.5 4.0 4.7  Hemoglobin 13.0 - 17.0 g/dL 10.9(L) 11.9(L) 11.0(L)  Hematocrit 39.0 - 52.0 % 35.2(L) 38.2(L) 34.4(L)  Platelets 150 - 400 K/uL 213 229 214    CMP Latest Ref Rng & Units 06/04/2019 05/31/2019 05/28/2019  Glucose 70 - 99 mg/dL 116(H) 105(H) 88  BUN 8 - 23 mg/dL 12 10 11   Creatinine 0.61 - 1.24 mg/dL 0.72 0.74 0.59(L)  Sodium 135 - 145 mmol/L 140 141 139  Potassium 3.5 - 5.1 mmol/L 3.6 4.3 4.0  Chloride 98 - 111 mmol/L 106 104 103  CO2 22 - 32 mmol/L 24 26 26   Calcium 8.9 - 10.3 mg/dL 8.7(L) 9.6 8.7(L)  Total Protein 6.5 - 8.1 g/dL - 6.8 -  Total Bilirubin 0.3 - 1.2 mg/dL - <0.2(L) -  Alkaline Phos 38 - 126 U/L - 68 -  AST 15 - 41 U/L - 19 -  ALT 0 - 44 U/L - 21 -   Lab Results  Component Value Date   LDH 116 05/31/2019    03/01/19 Colonoscopy biopsies:     03/17/18 Cytogenetics:   03/17/18 Colon Bx:   03/13/18 Peritoneum Biopsy:      RADIOGRAPHIC STUDIES:  I have personally reviewed the radiological images as listed and agreed with the findings in the report. Ct Abdomen Pelvis W Contrast  Result Date: 05/24/2019 CLINICAL DATA:  Abscess associated with colitis. Patient receiving outpatient antibiotics. Patient status post diverting loop ileostomy. History of Burkitt's lymphoma. EXAM: CT ABDOMEN AND PELVIS WITH CONTRAST TECHNIQUE: Multidetector CT imaging of the abdomen and pelvis was performed using the standard protocol following bolus administration of intravenous contrast. CONTRAST:   122m OMNIPAQUE IOHEXOL 300 MG/ML  SOLN COMPARISON:  CT abdomen 02/27/2019, 05/02/2019, 05/09/2019 FINDINGS: Lower chest: Lung bases are clear. Hepatobiliary: Tiny hypodense lesion in the dome the RIGHT hepatic lobe measuring 3 mm (image 10/2) and is unchanged and favored benign. Gallbladder normal. Common bile duct normal. Second small cystic lesion in the inferior RIGHT hepatic lobe is also unchanged Pancreas: Pancreas is normal. No ductal dilatation. No pancreatic inflammation. Spleen: Normal spleen Adrenals/urinary tract: Adrenal glands and kidneys are normal. The ureters and bladder normal. Stomach/Bowel: Stomach duodenum are normal. Contrast flows through the small bowel without evidence obstruction and exits a RIGHT abdominal wall ileostomy. Contrast fills the ileostomy bag. In the LEFT lower quadrant, there appears to be a communication between a loop of proximal small bowel and the descending colon/sigmoid colon. This communication is evident on image 53/2 axial image and coronal image 30/4 and sagittal image 92/5. This abnormality persist on comparison exams. Alternative explanation would be an abscess/submucosal edema from the colon which is distorts the lumen of the small bowel without actual communication. Again demonstrated inflammation involving the sigmoid colon with a Peri colonic submucosal abscess or edema. The findings are very similar to CT of 05/09/2019. There appears to be a tethering or communication between the ascending colon and sigmoid colon (image 59/2) also unchanged. More distal rectum normal Vascular/Lymphatic: . Abdominal aorta  is normal caliber. There is no retroperitoneal or periportal lymphadenopathy. No pelvic lymphadenopathy. Reproductive: Prostate normal Other: No free fluid. Musculoskeletal: No aggressive osseous lesion. IMPRESSION: 1. Overall little interval change from CT 05/09/2019. Persistent inflamed loop of distal sigmoid colon with submucosal edema. Potential fistulous  communication versus tethering to the ascending colon and a loop of small bowel. Findings are suggestive of inflammatory bowel disease. 2. No evidence of bowel obstruction. Oral contrast enters the ileostomy bag with bowel obstruction or small bowel dilatation. Electronically Signed   By: Suzy Bouchard M.D.   On: 05/24/2019 10:38   Ct Abdomen Pelvis W Contrast  Result Date: 05/09/2019 CLINICAL DATA:  63 year old male with a history of Burkitt's lymphoma who completed chemotherapy in October of 2019 now presents with persistent colitis and small-bowel obstruction status post diverting loop ileostomy EXAM: CT ABDOMEN AND PELVIS WITH CONTRAST TECHNIQUE: Multidetector CT imaging of the abdomen and pelvis was performed using the standard protocol following bolus administration of intravenous contrast. CONTRAST:  153m OMNIPAQUE IOHEXOL 300 MG/ML  SOLN COMPARISON:  Most recent prior CT scan of the abdomen and pelvis 05/04/2019 FINDINGS: Lower chest: Minimal dependent atelectasis. Otherwise, the lung bases are clear. The heart is normal in size. No pericardial effusion. Unremarkable distal esophagus. Hepatobiliary: Normal hepatic contour and morphology. No discrete hepatic lesions. Normal appearance of the gallbladder. No intra or extrahepatic biliary ductal dilatation. Pancreas: Unremarkable. No pancreatic ductal dilatation or surrounding inflammatory changes. Spleen: Normal in size without focal abnormality. Adrenals/Urinary Tract: Unremarkable adrenal glands. No evidence of hydronephrosis or enhancing renal mass. 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 Approximately 6 mm right renal stone without evidence of obstruction. The ureters are unremarkable. There is mild wall thickening of the left superolateral aspect of the bladder dome which is likely secondary to the inflammatory process involving the sigmoid colon. Stomach/Bowel: Persistent severe submucosal  edema, hyperenhancement and inflammatory stranding throughout the sigmoid colon. The degree of hyperenhancement is slightly greater compared to the recent prior study. The submucosal fluid and gas collection seen on the prior study is improving. There is only trace residual fluid but it is decreased in volume. Surgical changes of end ileostomy. The distal ileum is adherent to the severely inflamed sigmoid colon and demonstrates submucosal edema and hyperenhancement. There is a small tract of enhancing soft tissue extending from the descending colon into the internal oblique musculature of the left lower quadrant abdominal wall. No evidence of abscess or fluid collection. Liquid stool present in the rectum. No intraperitoneal abscess or ascites. Vascular/Lymphatic: No suspicious lymphadenopathy. Trace aortic atherosclerotic calcifications. No aneurysm or dissection. Venous structures appear patent. Reproductive: Prostate is unremarkable. Other: No ascites. Musculoskeletal: No acute fracture or aggressive appearing lytic or blastic osseous lesion. IMPRESSION: 1. Persistent severe submucosal edema, hyperenhancement and inflammatory change involving the sigmoid colon. There is secondary inflammatory thickening of the left superolateral bladder dome as well as secondary inflammatory wall thickening and hyperenhancement of the distal ileum as it heads to the right lower quadrant diverting ileostomy. Findings remain consistent with an active infectious or inflammatory colitis. 2. Improving submucosal/intramural abscess along the wall of the affected sigmoid colon. 3. Additional ancillary findings as above without significant interval change. Electronically Signed   By: HJacqulynn CadetM.D.   On: 05/09/2019 21:01   Ct Abdomen Pelvis W Contrast  Addendum Date: 05/04/2019   ADDENDUM REPORT: 05/04/2019 19:43 ADDENDUM: The appearance of the LEFT LATERAL abdominal wall abscess is stable compared  with most recent  comparison, 05/02/2019. The salient findings were discussed with Lacretia Leigh on 05/04/2019 at 7:43 pm. Electronically Signed   By: Nolon Nations M.D.   On: 05/04/2019 19:43   Result Date: 05/04/2019 CLINICAL DATA:  Patient states he was called by his physician to come to the ED for an abscess in his colon. Patient has been having a low grade fever at home patient denies any pain at this time. Follow CT scan from 8/19. Per patient he has back pain. Patient's history of lymphoma and bowel obstruction. EXAM: CT ABDOMEN AND PELVIS WITH CONTRAST TECHNIQUE: Multidetector CT imaging of the abdomen and pelvis was performed using the standard protocol following bolus administration of intravenous contrast. CONTRAST:  155m OMNIPAQUE IOHEXOL 300 MG/ML  SOLN COMPARISON:  02/27/2019, 05/02/2019, and multiple prior studies FINDINGS: Lower chest: Subsegmental atelectasis at the lung bases. Hepatobiliary: Small cysts are identified. Gallbladder is present. Note is made of a Phrygian cap. No evidence for acute cholecystitis. Pancreas: Unremarkable. No pancreatic ductal dilatation or surrounding inflammatory changes. Spleen: Normal in size without focal abnormality. Adrenals/Urinary Tract: Normal adrenal glands. Small bilateral renal cysts. Intrarenal calculus in the RIGHT kidney measures 7 millimeters. Punctate calcification is identified in the UPPER pole of the LEFT kidney, 2 millimeters in diameter. No solid renal mass or hydronephrosis. The ureters are unremarkable. Stomach/Bowel: Stomach is unremarkable. RIGHT LOWER QUADRANT loop ileostomy is nonobstructing. There continues to be significant tethering and inflammatory change in the central pelvis and LEFT LOWER QUADRANT, extending to the LEFT pelvic sidewall. The cecum and sigmoid colon appear to be adherent. There are coarse calcifications in the region of the soft tissue between the cecum and sigmoid colon. There is persistent tethering of the descending colon with the  LEFT LATERAL abdominal wall. There is persistent focal low-attenuation within the LEFT LATERAL peritoneal reflection, consistent with abscess, now measuring 2.7 x 1.7 centimeters. This is best seen on image 35 of series 7. There is associated subcutaneous edema overlying the LEFT LATERAL abdominal wall. Vascular/Lymphatic: There is atherosclerotic calcification of the abdominal aorta. No aneurysm. No retroperitoneal or mesenteric adenopathy. Reproductive: Prostate is unremarkable. Other: None Musculoskeletal: Degenerative changes are seen in the LOWER thoracic and lumbar spine. No suspicious lytic or blastic lesions are identified. IMPRESSION: 1. Persistent tethering of the descending colon with the LEFT LATERAL abdominal wall, associated with abscess within the LEFT LATERAL peritoneal reflection measuring 2.7 x 1.7 centimeters. 2. No evidence for bowel obstruction. 3. Bilateral nonobstructing intrarenal calculi. 4. Hepatic and renal cysts. 5.  Aortic atherosclerosis.  (ICD10-I70.0) 6. Degenerative changes in the LOWER thoracic and lumbar spine. Electronically Signed: By: ENolon NationsM.D. On: 05/04/2019 19:31   Ct Abdomen Pelvis W Contrast  Result Date: 05/02/2019 CLINICAL DATA:  Abdominal pain, fever abscess suspected. Pancolitis. Lymphoma. EXAM: CT ABDOMEN AND PELVIS WITH CONTRAST TECHNIQUE: Multidetector CT imaging of the abdomen and pelvis was performed using the standard protocol following bolus administration of intravenous contrast. CONTRAST:  1024mOMNIPAQUE IOHEXOL 300 MG/ML  SOLN COMPARISON:  02/27/2019. FINDINGS: Lower chest: Scattered scarring in the lung bases. Heart size normal. No pericardial or pleural effusion. Hepatobiliary: Millimetric low-attenuation lesions in the right hepatic lobe are unchanged and too small to characterize. Liver and gallbladder are otherwise unremarkable. No biliary ductal dilatation. Pancreas: Negative. Spleen: Negative. Adrenals/Urinary Tract: Adrenal glands are  unremarkable. Low-attenuation lesions in the kidneys measure up to 1.1 cm on the right, as before. But too small to characterize, statistically, cysts are likely. A stone is  seen in the right kidney. Ureters are decompressed. A 4 mm stone is seen in the dependent aspect of the bladder, at the left ureteral orifice. Bladder is otherwise low in volume. Stomach/Bowel: Stomach and majority of the small bowel is unremarkable. Right lower quadrant ileostomy. There is wall thickening and pericolonic inflammatory stranding involving the distal descending and sigmoid colon, similar to the prior exam. The amount of pericolonic fluid appears subjectively decreased from 02/27/2019 but is difficult to measure. Soft tissue thickening extends to the left lateral abdominal wall, where there is a 1.1 cm peripherally hyperattenuating fluid collection (2/46), new. Probable small adjacent mesenteric organized fluid collection measuring 1.8 x 2.8 cm (2/58), likely similar. Adjacent cecum and small bowel loops appear tethered to the area of inflammation in the sigmoid colon. Appendix is not readily visualized. Vascular/Lymphatic: Atherosclerotic calcification of the aorta without aneurysm. No pathologically enlarged lymph nodes. Reproductive: Prostate is visualized. Other: Minimal presacral edema. Small amount of fluid is seen adjacent to cecum. Musculoskeletal: Degenerative changes in the spine. No worrisome lytic or sclerotic lesions. IMPRESSION: 1. Persistent colitis involving the descending/sigmoid colon with decrease in the amount of pericolonic fluid. A small organized fluid collection in the adjacent mesentery appears stable. A second adjacent tiny organized fluid collection along the left lateral abdominal wall appears new. Cecum and adjacent bowel loops appear tethered to the inflamed sigmoid colon. 2. Right renal stone.  Tiny left bladder stone/calcification. 3.  Aortic atherosclerosis (ICD10-170.0). Electronically Signed   By:  Lorin Picket M.D.   On: 05/02/2019 13:13   Dg Chest Port 1 View  Result Date: 05/04/2019 CLINICAL DATA:  Cough, low-grade fever, diabetes mellitus, history lymphoma EXAM: PORTABLE CHEST 1 VIEW COMPARISON:  Portable exam 1820 hours compared to 01/01/2019 FINDINGS: RIGHT jugular Port-A-Cath with tip projecting over SVC. Mediastinal contours and pulmonary vascularity normal. Bibasilar scarring unchanged. No acute infiltrate, pleural effusion or pneumothorax. No acute osseous findings. IMPRESSION: Bibasilar scarring. No acute abnormalities. Electronically Signed   By: Lavonia Dana M.D.   On: 05/04/2019 18:48    ASSESSMENT & PLAN:   63 y.o. male with  1. 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 No BM or CNS involvement.   04/18/18 PET/CT revealed Continued improvement in right colonic wall thickening and surrounding bulky masses consistent with treated lymphoma. No hypermetabolic activity to suggest residual tumor. There is some hypermetabolic activity associated with the sigmoid colon which demonstrates mild wall thickening, surrounding inflammation and underlying diverticulosis, suggesting mild diverticulitis. Suspected treatment related changes throughout the bone marrow. There is mildly increased activity within the clivus without clear corresponding finding on the CT images. Attention on follow-up recommended. Nonobstructing right renal calculi. Known femoral vein DVT  on the right.   06/09/18 PET/CT revealedSoft tissue lesion within the ventral pelvis is again identified demonstrating mild to moderate increased uptake within SUV max of 5.61. Deauville criteria 4. Concerning for residual metabolically active tumor. 2. Similar appearance of diffusely thickened appendix within the right lower quadrant of the abdomen. Findings may reflect treatment related changes. 3. Increased radiotracer uptake  throughout the bone marrow which is favored to represent treatment related changes. 4. Lad coronary artery atherosclerotic calcifications.   patient completed planned chemorx on 10/21  10/05/18 CT A/P revealed Persistent wall thickening in the sigmoid colon although a component of this could be due to diverticulosis. There is also a suggestion of wall thickening and potential mucosal enhancement in the terminal ileum, as well as adjacent indistinct stranding in the mesenteric and omental adipose tissues roughly similar to the prior exam. Some of this may be treatment related. Inflammatory process such as Crohn's disease not excluded. 2. No current adenopathy. 3. Other imaging findings of potential clinical significance: Nonobstructive right nephrolithiasis. Aortic Atherosclerosis. Moderate prostatomegaly. Left foraminal impingement at L4-5.  S/p IMRT with 36 Gy over 20 fractions, between 11/09/18 and 12/06/18 with Dr. Isidore Moos  05/24/2019 CT CAP revealed "1. Overall little interval change from CT 05/09/2019. Persistent inflamed loop of distal sigmoid colon with submucosal edema. Potential fistulous communication versus tethering to the ascending colon and a loop of small bowel. Findings are suggestive of inflammatory bowel disease. 2. No evidence of bowel obstruction. Oral contrast enters the ileostomy bag with bowel obstruction or small bowel dilatation."  2.H/oRLE DVT and b/l PEon anticoagulation.   3. Small bowel obstruction/ileus with sigmoid thickening/stenosis .  CT  scan - sigmoid obstructive lesion ? Scar tissue from resolved lymphoma vs residual lymphoma vs post inflammatory scarring from diverticulitis/limited perforation. Also has SBO from adhesions. Colonoscopy 08/21/2018 - no intramucosal lesions. Extrinsic compression of sigmoid colon. 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 Greer Pickerel  4.  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  GI panel and C. difficile negative.  CMV IgM negative, EBV IgM negative. 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  5. Resolved Dehydration with hypovolemic hyponatremia.  Other electrolyte issues including hypokalemia and hypomagnesemia -stabilizing.  6. S/p  Pancytopenia Anemia likely multifactorial but could be from GI bleeding plus significant inflammation from severe radiation colitis. Leukopenia/lymphopenia likely from radiation toxicity. Protein-losing enteropathy can also accentuate the lymphopenia and bone marrow suppression due to severe inflammatory state from his radiation colitis. Thrombocytopenia from consumption related to GI bleeding and from radiation toxicity. No overt evidence of DIC. Significantly elevated fibrinogen level as a marker of severe inflammation and acute phase reactant. B12 within normal limits Copper levels within normal limits at 132 Zinc deficiency noted- 44 Viral work-up not revealing Low likelihood of Burkitt's lymphoma progression at this time.  7. Possible Crohn's  Follow up GI  PLAN: -Discussed pt labwork today, 05/31/2019; HGB almost normal at 11.9, blood counts are improving, kidney and liver function are stable -Discussed 05/24/2019 CT CAP which revealed slight inflammation and a persistent abscess. Likely caused by radiation.  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  -No indication for further treatment of lymphoma at this time.  -Continue follow up with Dr. Owens Loffler in GI -tracking sed rate and CRP as a function of resoliving bowel inflammation. -Continue Magnesium 400 mg BID -Recommend cultured yogurt instead of probiotics due to bowel problems -Recommend scheduling appointment with surgeon at [facility] to  address possible bowel surgery and possibility of ostomy reversal -Will see the pt back in 3 months   RTC with Dr [name] with labs in 3 months   I discussed the assessment and treatment plan with the patient. The patient was provided an opportunity to ask questions and all were answered. The patient agreed with the plan and demonstrated an understanding of the instructions.   The patient was advised to call back or seek an in-person evaluation if the symptoms worsen or if the condition fails to improve as anticipated.  The total time spent in the appt was 20 minutes and more than 50% was on counseling and direct patient cares.    Sullivan Lone MD [place] AAHIVMS [facility] Hematology/Oncology Physician [facility]  (Office):       [phone] (Work cell):  [phone] (Fax):           [phone]  I, De Burrs, am acting as a scribe for [name]  .I have reviewed the above documentation for accuracy and completeness, and I agree with the above. [name] MD

## 2019-05-30 DIAGNOSIS — C8370 Burkitt lymphoma, unspecified site: Secondary | ICD-10-CM | POA: Diagnosis not present

## 2019-05-30 DIAGNOSIS — Z932 Ileostomy status: Secondary | ICD-10-CM | POA: Diagnosis not present

## 2019-05-31 ENCOUNTER — Other Ambulatory Visit: Payer: Self-pay

## 2019-05-31 ENCOUNTER — Inpatient Hospital Stay (HOSPITAL_BASED_OUTPATIENT_CLINIC_OR_DEPARTMENT_OTHER): Payer: BC Managed Care – PPO | Admitting: Hematology

## 2019-05-31 ENCOUNTER — Inpatient Hospital Stay: Payer: BC Managed Care – PPO | Attending: Radiation Oncology

## 2019-05-31 ENCOUNTER — Telehealth: Payer: Self-pay | Admitting: Hematology

## 2019-05-31 VITALS — BP 105/71 | HR 87 | Temp 98.3°F | Resp 18 | Ht 69.0 in | Wt 194.2 lb

## 2019-05-31 DIAGNOSIS — Z86718 Personal history of other venous thrombosis and embolism: Secondary | ICD-10-CM | POA: Insufficient documentation

## 2019-05-31 DIAGNOSIS — C162 Malignant neoplasm of body of stomach: Secondary | ICD-10-CM | POA: Diagnosis not present

## 2019-05-31 DIAGNOSIS — C8378 Burkitt lymphoma, lymph nodes of multiple sites: Secondary | ICD-10-CM | POA: Diagnosis not present

## 2019-05-31 DIAGNOSIS — K5100 Ulcerative (chronic) pancolitis without complications: Secondary | ICD-10-CM | POA: Diagnosis not present

## 2019-05-31 DIAGNOSIS — E119 Type 2 diabetes mellitus without complications: Secondary | ICD-10-CM | POA: Diagnosis not present

## 2019-05-31 DIAGNOSIS — Z79899 Other long term (current) drug therapy: Secondary | ICD-10-CM | POA: Insufficient documentation

## 2019-05-31 DIAGNOSIS — R269 Unspecified abnormalities of gait and mobility: Secondary | ICD-10-CM | POA: Diagnosis not present

## 2019-05-31 DIAGNOSIS — D61818 Other pancytopenia: Secondary | ICD-10-CM | POA: Diagnosis not present

## 2019-05-31 DIAGNOSIS — K651 Peritoneal abscess: Secondary | ICD-10-CM | POA: Diagnosis not present

## 2019-05-31 DIAGNOSIS — R7881 Bacteremia: Secondary | ICD-10-CM | POA: Diagnosis not present

## 2019-05-31 DIAGNOSIS — Z86711 Personal history of pulmonary embolism: Secondary | ICD-10-CM | POA: Diagnosis not present

## 2019-05-31 DIAGNOSIS — Z7901 Long term (current) use of anticoagulants: Secondary | ICD-10-CM | POA: Insufficient documentation

## 2019-05-31 LAB — LACTATE DEHYDROGENASE: LDH: 116 U/L (ref 98–192)

## 2019-05-31 LAB — CMP (CANCER CENTER ONLY)
ALT: 21 U/L (ref 0–44)
AST: 19 U/L (ref 15–41)
Albumin: 3.6 g/dL (ref 3.5–5.0)
Alkaline Phosphatase: 68 U/L (ref 38–126)
Anion gap: 11 (ref 5–15)
BUN: 10 mg/dL (ref 8–23)
CO2: 26 mmol/L (ref 22–32)
Calcium: 9.6 mg/dL (ref 8.9–10.3)
Chloride: 104 mmol/L (ref 98–111)
Creatinine: 0.74 mg/dL (ref 0.61–1.24)
GFR, Est AFR Am: >60 mL/min (ref >60)
GFR, Estimated: >60 mL/min (ref >60)
Glucose, Bld: 105 mg/dL — ABNORMAL HIGH (ref 70–99)
Potassium: 4.3 mmol/L (ref 3.5–5.1)
Sodium: 141 mmol/L (ref 135–145)
Total Bilirubin: <0.2 mg/dL — ABNORMAL LOW (ref 0.3–1.2)
Total Protein: 6.8 g/dL (ref 6.5–8.1)

## 2019-05-31 LAB — CBC WITH DIFFERENTIAL/PLATELET
Abs Immature Granulocytes: 0.01 10*3/uL (ref 0.00–0.07)
Basophils Absolute: 0 10*3/uL (ref 0.0–0.1)
Basophils Relative: 1 %
Eosinophils Absolute: 0.1 10*3/uL (ref 0.0–0.5)
Eosinophils Relative: 2 %
HCT: 38.2 % — ABNORMAL LOW (ref 39.0–52.0)
Hemoglobin: 11.9 g/dL — ABNORMAL LOW (ref 13.0–17.0)
Immature Granulocytes: 0 %
Lymphocytes Relative: 34 %
Lymphs Abs: 1.4 10*3/uL (ref 0.7–4.0)
MCH: 33.4 pg (ref 26.0–34.0)
MCHC: 31.2 g/dL (ref 30.0–36.0)
MCV: 107.3 fL — ABNORMAL HIGH (ref 80.0–100.0)
Monocytes Absolute: 0.7 10*3/uL (ref 0.1–1.0)
Monocytes Relative: 17 %
Neutro Abs: 1.8 10*3/uL (ref 1.7–7.7)
Neutrophils Relative %: 46 %
Platelets: 229 10*3/uL (ref 150–400)
RBC: 3.56 MIL/uL — ABNORMAL LOW (ref 4.22–5.81)
RDW: 18.5 % — ABNORMAL HIGH (ref 11.5–15.5)
WBC: 4 10*3/uL (ref 4.0–10.5)
nRBC: 0 % (ref 0.0–0.2)

## 2019-05-31 LAB — MAGNESIUM: Magnesium: 1.4 mg/dL — CL (ref 1.7–2.4)

## 2019-05-31 NOTE — Telephone Encounter (Signed)
Scheduled appt per 9/17 los.  Spoke with [name] and she is aware of the appt date and time.

## 2019-05-31 NOTE — Progress Notes (Signed)
CRITICAL VALUE: Magnesium 1.4 DATE & TIME NOTIFIED: 05/31/2019, 0919 MESSENGER (representative from lab): Reva MD NOTIFIED: Dr.Kale @ [phone] RESPONSE: Evaluate patient

## 2019-06-01 DIAGNOSIS — C162 Malignant neoplasm of body of stomach: Secondary | ICD-10-CM | POA: Diagnosis not present

## 2019-06-01 DIAGNOSIS — R269 Unspecified abnormalities of gait and mobility: Secondary | ICD-10-CM | POA: Diagnosis not present

## 2019-06-04 ENCOUNTER — Other Ambulatory Visit (HOSPITAL_COMMUNITY)
Admission: AD | Admit: 2019-06-04 | Discharge: 2019-06-04 | Disposition: A | Discharge status: Home / Self Care | Payer: BC Managed Care – PPO | Source: Skilled Nursing Facility | Attending: Internal Medicine | Admitting: Internal Medicine

## 2019-06-04 DIAGNOSIS — K651 Peritoneal abscess: Secondary | ICD-10-CM | POA: Insufficient documentation

## 2019-06-04 DIAGNOSIS — R7881 Bacteremia: Secondary | ICD-10-CM | POA: Diagnosis not present

## 2019-06-04 DIAGNOSIS — K5100 Ulcerative (chronic) pancolitis without complications: Secondary | ICD-10-CM | POA: Insufficient documentation

## 2019-06-04 DIAGNOSIS — R269 Unspecified abnormalities of gait and mobility: Secondary | ICD-10-CM | POA: Diagnosis not present

## 2019-06-04 DIAGNOSIS — C162 Malignant neoplasm of body of stomach: Secondary | ICD-10-CM | POA: Diagnosis not present

## 2019-06-04 LAB — BASIC METABOLIC PANEL
Anion gap: 10 (ref 5–15)
BUN: 12 mg/dL (ref 8–23)
CO2: 24 mmol/L (ref 22–32)
Calcium: 8.7 mg/dL — ABNORMAL LOW (ref 8.9–10.3)
Chloride: 106 mmol/L (ref 98–111)
Creatinine, Ser: 0.72 mg/dL (ref 0.61–1.24)
GFR calc Af Amer: >60 mL/min (ref >60)
GFR calc non Af Amer: >60 mL/min (ref >60)
Glucose, Bld: 116 mg/dL — ABNORMAL HIGH (ref 70–99)
Potassium: 3.6 mmol/L (ref 3.5–5.1)
Sodium: 140 mmol/L (ref 135–145)

## 2019-06-04 LAB — CBC WITH DIFFERENTIAL/PLATELET
Abs Immature Granulocytes: 0.01 10*3/uL (ref 0.00–0.07)
Basophils Absolute: 0 10*3/uL (ref 0.0–0.1)
Basophils Relative: 1 %
Eosinophils Absolute: 0.1 10*3/uL (ref 0.0–0.5)
Eosinophils Relative: 1 %
HCT: 35.2 % — ABNORMAL LOW (ref 39.0–52.0)
Hemoglobin: 10.9 g/dL — ABNORMAL LOW (ref 13.0–17.0)
Immature Granulocytes: 0 %
Lymphocytes Relative: 25 %
Lymphs Abs: 1.4 10*3/uL (ref 0.7–4.0)
MCH: 34.1 pg — ABNORMAL HIGH (ref 26.0–34.0)
MCHC: 31 g/dL (ref 30.0–36.0)
MCV: 110 fL — ABNORMAL HIGH (ref 80.0–100.0)
Monocytes Absolute: 0.6 10*3/uL (ref 0.1–1.0)
Monocytes Relative: 11 %
Neutro Abs: 3.4 10*3/uL (ref 1.7–7.7)
Neutrophils Relative %: 62 %
Platelets: 213 10*3/uL (ref 150–400)
RBC: 3.2 MIL/uL — ABNORMAL LOW (ref 4.22–5.81)
RDW: 18.5 % — ABNORMAL HIGH (ref 11.5–15.5)
WBC: 5.5 10*3/uL (ref 4.0–10.5)
nRBC: 0 % (ref 0.0–0.2)

## 2019-06-06 DIAGNOSIS — Z6828 Body mass index (BMI) 28.0-28.9, adult: Secondary | ICD-10-CM | POA: Diagnosis not present

## 2019-06-06 DIAGNOSIS — C8370 Burkitt lymphoma, unspecified site: Secondary | ICD-10-CM | POA: Diagnosis not present

## 2019-06-06 DIAGNOSIS — K529 Noninfective gastroenteritis and colitis, unspecified: Secondary | ICD-10-CM | POA: Diagnosis not present

## 2019-06-07 ENCOUNTER — Other Ambulatory Visit: Payer: Self-pay

## 2019-06-07 ENCOUNTER — Telehealth: Payer: Self-pay

## 2019-06-07 DIAGNOSIS — C162 Malignant neoplasm of body of stomach: Secondary | ICD-10-CM | POA: Diagnosis not present

## 2019-06-07 DIAGNOSIS — R269 Unspecified abnormalities of gait and mobility: Secondary | ICD-10-CM | POA: Diagnosis not present

## 2019-06-07 NOTE — Telephone Encounter (Signed)
Received call from [name] (wife) who stated the patient was seen by Dr. Amado Coe surgeon) and would like patient cover with ceftriaxone plus oral metronidazole until next follow up appointment on 06/20/19. At this time patient only has about 1 week  Of antibiotics. Routing to Dr. Baxter Flattery for advise. Eugenia Mcalpine

## 2019-06-07 NOTE — Progress Notes (Signed)
Entered in Error

## 2019-06-08 DIAGNOSIS — R269 Unspecified abnormalities of gait and mobility: Secondary | ICD-10-CM | POA: Diagnosis not present

## 2019-06-08 DIAGNOSIS — C162 Malignant neoplasm of body of stomach: Secondary | ICD-10-CM | POA: Diagnosis not present

## 2019-06-08 NOTE — Telephone Encounter (Signed)
Called advance and spoke with Melissa, gave verbal orders per Dr. Baxter Flattery to extend IV antibiotics until 10/10. Verbal order read back and understood.  Eugenia Mcalpine

## 2019-06-08 NOTE — Telephone Encounter (Signed)
Can you call the advance to extend abtx until 10/10.

## 2019-06-11 ENCOUNTER — Other Ambulatory Visit (HOSPITAL_COMMUNITY)
Admission: RE | Admit: 2019-06-11 | Discharge: 2019-06-11 | Disposition: A | Discharge status: Home / Self Care | Payer: BC Managed Care – PPO | Source: Other Acute Inpatient Hospital | Attending: Internal Medicine | Admitting: Internal Medicine

## 2019-06-11 DIAGNOSIS — K5100 Ulcerative (chronic) pancolitis without complications: Secondary | ICD-10-CM | POA: Insufficient documentation

## 2019-06-11 DIAGNOSIS — R7881 Bacteremia: Secondary | ICD-10-CM | POA: Diagnosis not present

## 2019-06-11 DIAGNOSIS — R269 Unspecified abnormalities of gait and mobility: Secondary | ICD-10-CM | POA: Diagnosis not present

## 2019-06-11 DIAGNOSIS — K651 Peritoneal abscess: Secondary | ICD-10-CM | POA: Insufficient documentation

## 2019-06-11 DIAGNOSIS — C162 Malignant neoplasm of body of stomach: Secondary | ICD-10-CM | POA: Diagnosis not present

## 2019-06-11 LAB — BASIC METABOLIC PANEL
Anion gap: 13 (ref 5–15)
BUN: 13 mg/dL (ref 8–23)
CO2: 22 mmol/L (ref 22–32)
Calcium: 8.6 mg/dL — ABNORMAL LOW (ref 8.9–10.3)
Chloride: 107 mmol/L (ref 98–111)
Creatinine, Ser: 0.66 mg/dL (ref 0.61–1.24)
GFR calc Af Amer: >60 mL/min (ref >60)
GFR calc non Af Amer: >60 mL/min (ref >60)
Glucose, Bld: 108 mg/dL — ABNORMAL HIGH (ref 70–99)
Potassium: 3.4 mmol/L — ABNORMAL LOW (ref 3.5–5.1)
Sodium: 142 mmol/L (ref 135–145)

## 2019-06-11 LAB — CBC WITH DIFFERENTIAL/PLATELET
Abs Immature Granulocytes: 0.01 10*3/uL (ref 0.00–0.07)
Basophils Absolute: 0 10*3/uL (ref 0.0–0.1)
Basophils Relative: 1 %
Eosinophils Absolute: 0 10*3/uL (ref 0.0–0.5)
Eosinophils Relative: 1 %
HCT: 35.8 % — ABNORMAL LOW (ref 39.0–52.0)
Hemoglobin: 11 g/dL — ABNORMAL LOW (ref 13.0–17.0)
Immature Granulocytes: 0 %
Lymphocytes Relative: 22 %
Lymphs Abs: 1.1 10*3/uL (ref 0.7–4.0)
MCH: 34.3 pg — ABNORMAL HIGH (ref 26.0–34.0)
MCHC: 30.7 g/dL (ref 30.0–36.0)
MCV: 111.5 fL — ABNORMAL HIGH (ref 80.0–100.0)
Monocytes Absolute: 0.6 10*3/uL (ref 0.1–1.0)
Monocytes Relative: 11 %
Neutro Abs: 3.3 10*3/uL (ref 1.7–7.7)
Neutrophils Relative %: 65 %
Platelets: 172 10*3/uL (ref 150–400)
RBC: 3.21 MIL/uL — ABNORMAL LOW (ref 4.22–5.81)
RDW: 18.4 % — ABNORMAL HIGH (ref 11.5–15.5)
WBC: 5.1 10*3/uL (ref 4.0–10.5)
nRBC: 0 % (ref 0.0–0.2)

## 2019-06-11 IMAGING — CR DG ABDOMEN 2V
2 series · 2 of 2 positions shown · non-contrast
Comparison: 08/01/2018, 07/31/2018, 07/30/2018

CLINICAL DATA: Follow-up bowel obstruction

EXAM:
ABDOMEN - 2 VIEW

[w abdomen upright]
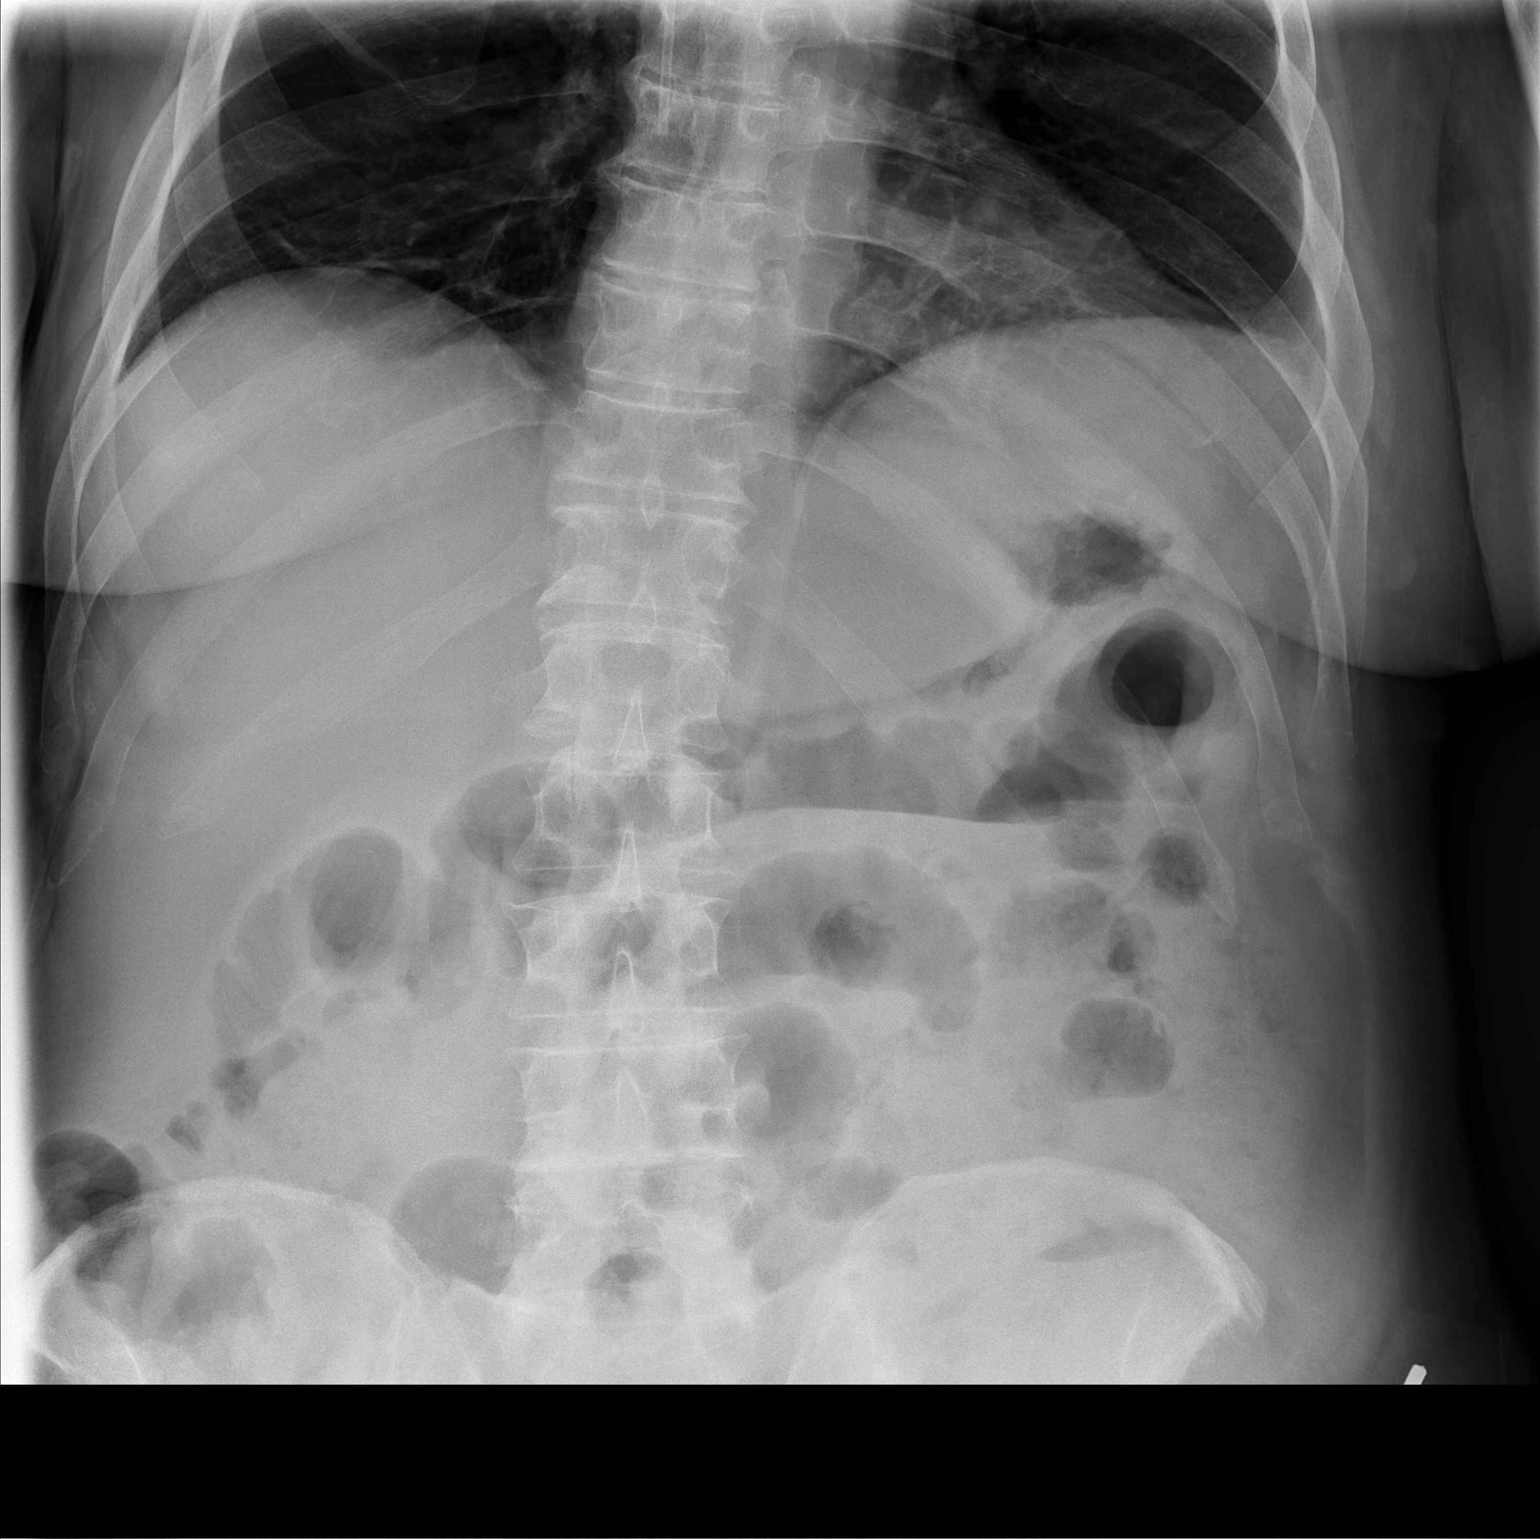

[t abdomen supine]
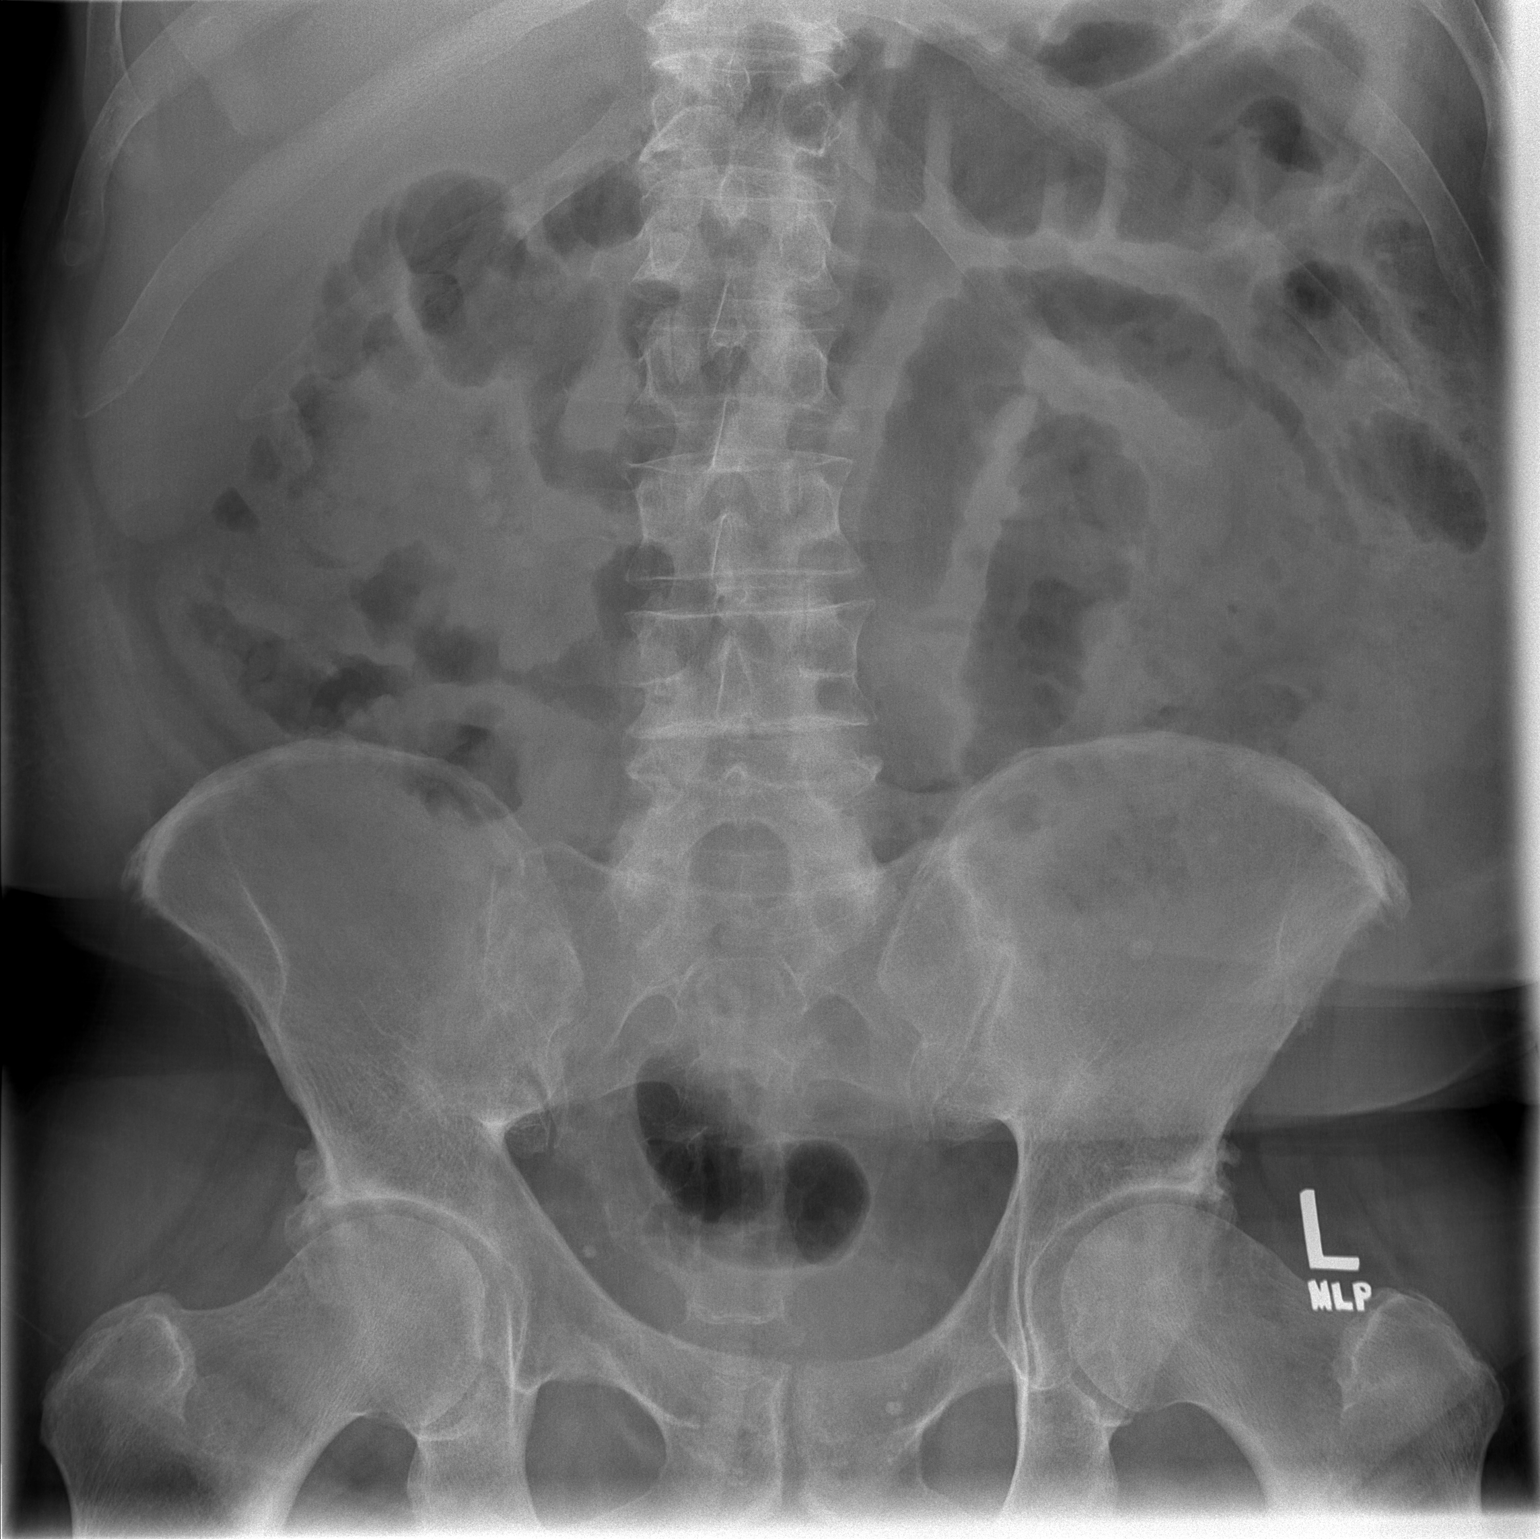

[2 of 2 positions shown; findings below may reference images not displayed]

FINDINGS: Mild gas-filled enlarged loops of central small bowel up to 3.9 cm
but overall decreased number and degree of small bowel distension.
Some scattered colon gas is noted. There is no free air beneath the
diaphragm.
IMPRESSION: Mild residual small bowel dilatation but overall decreased fluid
levels and number of dilated small bowel loops, consistent with
partial or resolving bowel obstruction. Findings are improved
radiographically as compared with 08/01/2018.

## 2019-06-12 ENCOUNTER — Other Ambulatory Visit: Payer: Self-pay | Admitting: Internal Medicine

## 2019-06-13 DIAGNOSIS — C8370 Burkitt lymphoma, unspecified site: Secondary | ICD-10-CM | POA: Diagnosis not present

## 2019-06-13 NOTE — Telephone Encounter (Signed)
RN reviewed phone message notes.  Routing question to Dr. Baxter Flattery to review refill request for Metronidazole tablets.

## 2019-06-14 ENCOUNTER — Telehealth: Payer: Self-pay

## 2019-06-14 DIAGNOSIS — R269 Unspecified abnormalities of gait and mobility: Secondary | ICD-10-CM | POA: Diagnosis not present

## 2019-06-14 DIAGNOSIS — C162 Malignant neoplasm of body of stomach: Secondary | ICD-10-CM | POA: Diagnosis not present

## 2019-06-14 MED ORDER — METRONIDAZOLE 500 MG PO TABS: 500.0000 mg | ORAL_TABLET | Freq: Three times a day (TID) | ORAL | 0 refills | Status: DC

## 2019-06-14 NOTE — Telephone Encounter (Signed)
Patients wife is calling regarding oral antibiotics.  Orders given for IV antibiotics but not for oral.   Per Dr Baxter Flattery he is to continue metronidazole three times daily until his appointment with the surgeon.  Refills sent to pharmacy.   Laverle Patter, RN

## 2019-06-15 DIAGNOSIS — R269 Unspecified abnormalities of gait and mobility: Secondary | ICD-10-CM | POA: Diagnosis not present

## 2019-06-15 DIAGNOSIS — K651 Peritoneal abscess: Secondary | ICD-10-CM | POA: Diagnosis not present

## 2019-06-15 DIAGNOSIS — C162 Malignant neoplasm of body of stomach: Secondary | ICD-10-CM | POA: Diagnosis not present

## 2019-06-15 DIAGNOSIS — R7881 Bacteremia: Secondary | ICD-10-CM | POA: Diagnosis not present

## 2019-06-15 DIAGNOSIS — K5100 Ulcerative (chronic) pancolitis without complications: Secondary | ICD-10-CM | POA: Diagnosis not present

## 2019-06-17 ENCOUNTER — Other Ambulatory Visit (HOSPITAL_COMMUNITY)
Admission: RE | Admit: 2019-06-17 | Discharge: 2019-06-17 | Disposition: A | Discharge status: Home / Self Care | Payer: BC Managed Care – PPO | Source: Other Acute Inpatient Hospital | Attending: Internal Medicine | Admitting: Internal Medicine

## 2019-06-17 DIAGNOSIS — K5100 Ulcerative (chronic) pancolitis without complications: Secondary | ICD-10-CM | POA: Diagnosis not present

## 2019-06-17 DIAGNOSIS — K651 Peritoneal abscess: Secondary | ICD-10-CM | POA: Diagnosis not present

## 2019-06-17 DIAGNOSIS — R7881 Bacteremia: Secondary | ICD-10-CM | POA: Diagnosis not present

## 2019-06-17 LAB — CBC WITH DIFFERENTIAL/PLATELET
Abs Immature Granulocytes: 0 10*3/uL (ref 0.00–0.07)
Basophils Absolute: 0 10*3/uL (ref 0.0–0.1)
Basophils Relative: 1 %
Eosinophils Absolute: 0.1 10*3/uL (ref 0.0–0.5)
Eosinophils Relative: 2 %
HCT: 33.5 % — ABNORMAL LOW (ref 39.0–52.0)
Hemoglobin: 10.7 g/dL — ABNORMAL LOW (ref 13.0–17.0)
Immature Granulocytes: 0 %
Lymphocytes Relative: 29 %
Lymphs Abs: 1.2 10*3/uL (ref 0.7–4.0)
MCH: 34.4 pg — ABNORMAL HIGH (ref 26.0–34.0)
MCHC: 31.9 g/dL (ref 30.0–36.0)
MCV: 107.7 fL — ABNORMAL HIGH (ref 80.0–100.0)
Monocytes Absolute: 0.6 10*3/uL (ref 0.1–1.0)
Monocytes Relative: 14 %
Neutro Abs: 2.2 10*3/uL (ref 1.7–7.7)
Neutrophils Relative %: 54 %
Platelets: 161 10*3/uL (ref 150–400)
RBC: 3.11 MIL/uL — ABNORMAL LOW (ref 4.22–5.81)
RDW: 17.7 % — ABNORMAL HIGH (ref 11.5–15.5)
WBC: 4.1 10*3/uL (ref 4.0–10.5)
nRBC: 0 % (ref 0.0–0.2)

## 2019-06-17 LAB — BASIC METABOLIC PANEL
Anion gap: 7 (ref 5–15)
BUN: 12 mg/dL (ref 8–23)
CO2: 24 mmol/L (ref 22–32)
Calcium: 8.9 mg/dL (ref 8.9–10.3)
Chloride: 106 mmol/L (ref 98–111)
Creatinine, Ser: 0.69 mg/dL (ref 0.61–1.24)
GFR calc Af Amer: >60 mL/min (ref >60)
GFR calc non Af Amer: >60 mL/min (ref >60)
Glucose, Bld: 96 mg/dL (ref 70–99)
Potassium: 3.9 mmol/L (ref 3.5–5.1)
Sodium: 137 mmol/L (ref 135–145)

## 2019-06-18 DIAGNOSIS — R269 Unspecified abnormalities of gait and mobility: Secondary | ICD-10-CM | POA: Diagnosis not present

## 2019-06-18 DIAGNOSIS — C162 Malignant neoplasm of body of stomach: Secondary | ICD-10-CM | POA: Diagnosis not present

## 2019-06-19 DIAGNOSIS — R269 Unspecified abnormalities of gait and mobility: Secondary | ICD-10-CM | POA: Diagnosis not present

## 2019-06-19 DIAGNOSIS — C162 Malignant neoplasm of body of stomach: Secondary | ICD-10-CM | POA: Diagnosis not present

## 2019-06-20 DIAGNOSIS — Z6829 Body mass index (BMI) 29.0-29.9, adult: Secondary | ICD-10-CM | POA: Diagnosis not present

## 2019-06-20 DIAGNOSIS — K5720 Diverticulitis of large intestine with perforation and abscess without bleeding: Secondary | ICD-10-CM | POA: Diagnosis not present

## 2019-06-21 ENCOUNTER — Telehealth: Payer: Self-pay | Admitting: *Deleted

## 2019-06-21 ENCOUNTER — Telehealth: Payer: Self-pay | Admitting: Family Medicine

## 2019-06-21 DIAGNOSIS — Z932 Ileostomy status: Secondary | ICD-10-CM | POA: Diagnosis not present

## 2019-06-21 DIAGNOSIS — D708 Other neutropenia: Secondary | ICD-10-CM

## 2019-06-21 MED ORDER — AMOXICILLIN-POT CLAVULANATE 875-125 MG PO TABS: 1.0000 | ORAL_TABLET | Freq: Two times a day (BID) | ORAL | 0 refills | Status: DC

## 2019-06-21 NOTE — Telephone Encounter (Signed)
See below

## 2019-06-21 NOTE — Telephone Encounter (Signed)
Spoke with Dr. Victorio Palm from [facility] colorectal surgery-I explained some background on patient and how sick he had been back in May as well as how firm he generally is in his decisions.  Hoping this was helpful in his long-term care.  She has recommended surgery for ongoing issues with stricture but he is declining for now

## 2019-06-21 NOTE — Telephone Encounter (Signed)
Per Dr Baxter Flattery called Mark Frey and advised can D/C the PICC after his last dose 06/23/19.  Called the patient and advised him PICC is being removed and that an oral medication has been sent to his pharmacy. Pharmacy verified and medication sent per [facility] instructions.

## 2019-06-21 NOTE — Telephone Encounter (Signed)
-----   Message from Carlyle Basques, MD sent at 06/21/2019 12:27 PM EDT ----- Regarding: RE: Mutual patient I spoke to his [facility] surgeon. Plan to finish his iv abtx this Saturday. Please have home health pull line. Send in rx for amox/clav 840m  bid x 14d to start on Sunday. ----- Message ----- From: PReggy Eye CMA Sent: 06/21/2019  11:35 AM EDT To: CCarlyle Basques MD Subject: Mutual patient                                 Dr KVictorio Palmwho is also taking care of Mr CTaddeiwould like to talk to you in reference to this patient when you are available. Cell #[phone] TDarnelle Maffucci

## 2019-06-21 NOTE — Telephone Encounter (Signed)
Dr Victorio Palm calling and is requesting a call back from dr Yong Channel to discuss this mutual patient. Attempted office x2, no answer. Please advise.  CB#: [phone]

## 2019-06-21 NOTE — Telephone Encounter (Signed)
See note

## 2019-06-22 ENCOUNTER — Telehealth: Payer: Self-pay | Admitting: Gastroenterology

## 2019-06-22 DIAGNOSIS — C162 Malignant neoplasm of body of stomach: Secondary | ICD-10-CM | POA: Diagnosis not present

## 2019-06-22 DIAGNOSIS — R269 Unspecified abnormalities of gait and mobility: Secondary | ICD-10-CM | POA: Diagnosis not present

## 2019-06-22 NOTE — Telephone Encounter (Signed)
I spoke with Dr. Victorio Palm, a surgeon at [facility]. She met Mark Frey and strongly advised him to have a subtotal colectomy.  I agree with her recommendation and in fact it seemed that at the time of his recent OV with me he also agreed.  However he now declines to have the surgery.  He instead wants to stay on antibiotics indefinitely which does not seem like a very good idea.  I am not sure that Dr. Baxter Flattery is going to agree with that approach either.

## 2019-06-22 NOTE — Telephone Encounter (Signed)
-----   Message from Sim Boast sent at 06/20/2019  4:59 PM EDT ----- Regarding: Pls call Dr. Yancey Flemings Dr. Ardis Hughs,   Dr. Victorio Palm would like to speak with you at your earliest convenience regarding this patient. Her phone number is [phone].  Thank you,  Tye [place]

## 2019-06-26 DIAGNOSIS — C162 Malignant neoplasm of body of stomach: Secondary | ICD-10-CM | POA: Diagnosis not present

## 2019-06-26 DIAGNOSIS — R269 Unspecified abnormalities of gait and mobility: Secondary | ICD-10-CM | POA: Diagnosis not present

## 2019-06-28 DIAGNOSIS — C162 Malignant neoplasm of body of stomach: Secondary | ICD-10-CM | POA: Diagnosis not present

## 2019-06-28 DIAGNOSIS — R269 Unspecified abnormalities of gait and mobility: Secondary | ICD-10-CM | POA: Diagnosis not present

## 2019-06-29 DIAGNOSIS — C162 Malignant neoplasm of body of stomach: Secondary | ICD-10-CM | POA: Diagnosis not present

## 2019-06-29 DIAGNOSIS — R269 Unspecified abnormalities of gait and mobility: Secondary | ICD-10-CM | POA: Diagnosis not present

## 2019-07-02 ENCOUNTER — Emergency Department (HOSPITAL_COMMUNITY): Payer: BC Managed Care – PPO

## 2019-07-02 ENCOUNTER — Other Ambulatory Visit: Payer: Self-pay

## 2019-07-02 ENCOUNTER — Encounter (HOSPITAL_COMMUNITY): Payer: Self-pay | Admitting: Emergency Medicine

## 2019-07-02 ENCOUNTER — Inpatient Hospital Stay (HOSPITAL_COMMUNITY)
Admission: EM | Admit: 2019-07-02 | Discharge: 2019-07-05 | DRG: 872 | Disposition: A | Discharge status: Home / Self Care | Payer: BC Managed Care – PPO | Attending: Internal Medicine | Admitting: Internal Medicine

## 2019-07-02 DIAGNOSIS — Z79899 Other long term (current) drug therapy: Secondary | ICD-10-CM

## 2019-07-02 DIAGNOSIS — D61818 Other pancytopenia: Secondary | ICD-10-CM | POA: Diagnosis not present

## 2019-07-02 DIAGNOSIS — E876 Hypokalemia: Secondary | ICD-10-CM | POA: Diagnosis present

## 2019-07-02 DIAGNOSIS — Z8572 Personal history of non-Hodgkin lymphomas: Secondary | ICD-10-CM

## 2019-07-02 DIAGNOSIS — I2699 Other pulmonary embolism without acute cor pulmonale: Secondary | ICD-10-CM | POA: Diagnosis present

## 2019-07-02 DIAGNOSIS — Z20828 Contact with and (suspected) exposure to other viral communicable diseases: Secondary | ICD-10-CM | POA: Diagnosis present

## 2019-07-02 DIAGNOSIS — E785 Hyperlipidemia, unspecified: Secondary | ICD-10-CM | POA: Diagnosis not present

## 2019-07-02 DIAGNOSIS — I10 Essential (primary) hypertension: Secondary | ICD-10-CM | POA: Diagnosis present

## 2019-07-02 DIAGNOSIS — Z79891 Long term (current) use of opiate analgesic: Secondary | ICD-10-CM | POA: Diagnosis not present

## 2019-07-02 DIAGNOSIS — R109 Unspecified abdominal pain: Secondary | ICD-10-CM | POA: Diagnosis not present

## 2019-07-02 DIAGNOSIS — A419 Sepsis, unspecified organism: Principal | ICD-10-CM | POA: Diagnosis present

## 2019-07-02 DIAGNOSIS — Z881 Allergy status to other antibiotic agents status: Secondary | ICD-10-CM

## 2019-07-02 DIAGNOSIS — E119 Type 2 diabetes mellitus without complications: Secondary | ICD-10-CM

## 2019-07-02 DIAGNOSIS — D539 Nutritional anemia, unspecified: Secondary | ICD-10-CM | POA: Diagnosis present

## 2019-07-02 DIAGNOSIS — Z808 Family history of malignant neoplasm of other organs or systems: Secondary | ICD-10-CM | POA: Diagnosis not present

## 2019-07-02 DIAGNOSIS — Z801 Family history of malignant neoplasm of trachea, bronchus and lung: Secondary | ICD-10-CM

## 2019-07-02 DIAGNOSIS — Z9221 Personal history of antineoplastic chemotherapy: Secondary | ICD-10-CM

## 2019-07-02 DIAGNOSIS — D638 Anemia in other chronic diseases classified elsewhere: Secondary | ICD-10-CM | POA: Diagnosis present

## 2019-07-02 DIAGNOSIS — Z933 Colostomy status: Secondary | ICD-10-CM

## 2019-07-02 DIAGNOSIS — Z923 Personal history of irradiation: Secondary | ICD-10-CM | POA: Diagnosis not present

## 2019-07-02 DIAGNOSIS — C8510 Unspecified B-cell lymphoma, unspecified site: Secondary | ICD-10-CM | POA: Diagnosis present

## 2019-07-02 DIAGNOSIS — R5081 Fever presenting with conditions classified elsewhere: Secondary | ICD-10-CM | POA: Diagnosis not present

## 2019-07-02 DIAGNOSIS — D709 Neutropenia, unspecified: Secondary | ICD-10-CM | POA: Diagnosis present

## 2019-07-02 DIAGNOSIS — R509 Fever, unspecified: Secondary | ICD-10-CM | POA: Diagnosis not present

## 2019-07-02 DIAGNOSIS — Z7901 Long term (current) use of anticoagulants: Secondary | ICD-10-CM

## 2019-07-02 DIAGNOSIS — K5732 Diverticulitis of large intestine without perforation or abscess without bleeding: Secondary | ICD-10-CM | POA: Diagnosis present

## 2019-07-02 DIAGNOSIS — Z792 Long term (current) use of antibiotics: Secondary | ICD-10-CM | POA: Diagnosis not present

## 2019-07-02 DIAGNOSIS — Z86711 Personal history of pulmonary embolism: Secondary | ICD-10-CM | POA: Diagnosis not present

## 2019-07-02 DIAGNOSIS — D708 Other neutropenia: Secondary | ICD-10-CM

## 2019-07-02 LAB — URINALYSIS, ROUTINE W REFLEX MICROSCOPIC
Bilirubin Urine: NEGATIVE
Glucose, UA: NEGATIVE mg/dL
Hgb urine dipstick: NEGATIVE
Ketones, ur: NEGATIVE mg/dL
Leukocytes,Ua: NEGATIVE
Nitrite: NEGATIVE
Protein, ur: NEGATIVE mg/dL
Specific Gravity, Urine: 1.009 (ref 1.005–1.030)
pH: 5 (ref 5.0–8.0)

## 2019-07-02 LAB — COMPREHENSIVE METABOLIC PANEL
ALT: 23 U/L (ref 0–44)
AST: 17 U/L (ref 15–41)
Albumin: 3.6 g/dL (ref 3.5–5.0)
Alkaline Phosphatase: 59 U/L (ref 38–126)
Anion gap: 13 (ref 5–15)
BUN: 9 mg/dL (ref 8–23)
CO2: 20 mmol/L — ABNORMAL LOW (ref 22–32)
Calcium: 9.2 mg/dL (ref 8.9–10.3)
Chloride: 100 mmol/L (ref 98–111)
Creatinine, Ser: 0.64 mg/dL (ref 0.61–1.24)
GFR calc Af Amer: >60 mL/min (ref >60)
GFR calc non Af Amer: >60 mL/min (ref >60)
Glucose, Bld: 156 mg/dL — ABNORMAL HIGH (ref 70–99)
Potassium: 3.3 mmol/L — ABNORMAL LOW (ref 3.5–5.1)
Sodium: 133 mmol/L — ABNORMAL LOW (ref 135–145)
Total Bilirubin: 1.1 mg/dL (ref 0.3–1.2)
Total Protein: 7.1 g/dL (ref 6.5–8.1)

## 2019-07-02 LAB — CBC WITH DIFFERENTIAL/PLATELET
Abs Immature Granulocytes: 0 10*3/uL (ref 0.00–0.07)
Band Neutrophils: 0 %
Basophils Absolute: 0 10*3/uL (ref 0.0–0.1)
Basophils Relative: 0 %
Eosinophils Absolute: 0 10*3/uL (ref 0.0–0.5)
Eosinophils Relative: 0 %
HCT: 35.1 % — ABNORMAL LOW (ref 39.0–52.0)
Hemoglobin: 11.7 g/dL — ABNORMAL LOW (ref 13.0–17.0)
Lymphocytes Relative: 0 %
Lymphs Abs: 0 10*3/uL — ABNORMAL LOW (ref 0.7–4.0)
MCH: 35.5 pg — ABNORMAL HIGH (ref 26.0–34.0)
MCHC: 33.3 g/dL (ref 30.0–36.0)
MCV: 106.4 fL — ABNORMAL HIGH (ref 80.0–100.0)
Monocytes Absolute: 0 10*3/uL — ABNORMAL LOW (ref 0.1–1.0)
Monocytes Relative: 0 %
Neutrophils Relative %: 0 %
Platelets: 139 10*3/uL — ABNORMAL LOW (ref 150–400)
RBC: 3.3 MIL/uL — ABNORMAL LOW (ref 4.22–5.81)
RDW: 15.9 % — ABNORMAL HIGH (ref 11.5–15.5)
WBC: 0.8 10*3/uL — CL (ref 4.0–10.5)
nRBC: 0 % (ref 0.0–0.2)
nRBC: 0 /100 WBC

## 2019-07-02 LAB — LACTIC ACID, PLASMA: Lactic Acid, Venous: 1.3 mmol/L (ref 0.5–1.9)

## 2019-07-02 MED ORDER — SODIUM CHLORIDE 0.9 % IV BOLUS
1000.0000 mL | Freq: Once | INTRAVENOUS | Status: AC
  Administered 2019-07-02: 1000 mL via INTRAVENOUS

## 2019-07-02 MED ORDER — PIPERACILLIN-TAZOBACTAM 3.375 G IVPB 30 MIN
3.3750 g | Freq: Once | INTRAVENOUS | Status: AC
  Administered 2019-07-02: 3.375 g via INTRAVENOUS
  Filled 2019-07-02: qty 50

## 2019-07-02 MED ORDER — SODIUM CHLORIDE (PF) 0.9 % IJ SOLN
INTRAMUSCULAR | Status: AC
  Administered 2019-07-03: 01:00:00
  Filled 2019-07-02: qty 50

## 2019-07-02 NOTE — ED Provider Notes (Signed)
[facility] DEPT Provider Note   CSN: [id number] Arrival date & time: 07/02/19  1540     History   Chief Complaint Chief Complaint  Patient presents with  . Fever    HPI Mark Frey is a 63 y.o. male.     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 He began having decreased appetite, low energy, and climbing temperature today, all of which wife reports he has had previously when he has had an abscess before. T max this evening 102.4, no meds PTA. No abdominal pain, vomiting, change in ostomy output, urinary symptoms, cough/cold symptoms, or sick contacts.  The history is provided by the patient and the spouse.  Fever   Past Medical History:  Diagnosis Date  . ALLERGIC RHINITIS   . Cancer ([place])    Lymphoma   . Diabetes mellitus   . Hyperlipidemia     Patient Active Problem List   Diagnosis Date Noted  . Intra-abdominal abscess ([place]) 05/04/2019  . Depression, major, single episode, complete remission ([place]) 04/17/2019  . Pancolitis ( [place]) 02/22/2019  . 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 Thrombocytopenia ([place])   . Symptomatic anemia   . Atrophy of muscle of multiple sites   . Goals of care, counseling/discussion   . Hypomagnesemia 01/11/2019  . Neutropenia ([place]) 01/11/2019  . Other neutropenia ([place])   . Pressure injury of skin 01/07/2019  . Zinc deficiency   . Radiation gastroenteritis   . Radiation colitis   . Hyponatremia 01/01/2019  . Pancytopenia ([place]) 01/01/2019  . Hematochezia   . Loop ilesotomy for fecal diversion of sigmoid stricture 08/25/2018  . Severe protein-calorie malnutrition ([place]) 08/19/2018  . 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 Low magnesium level 07/30/2018  . Bowel obstruction in setting of lymphoma and sigmoid stricture 07/21/2018  . Edema   . Deep vein thrombosis (DVT) of  femoral vein of right lower extremity ([place])   . Counseling regarding advance care planning and goals of care 04/25/2018  . Gastrointestinal hemorrhage   . 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 Port-A-Cath in place 03/27/2018  . 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[place]) 03/15/2018  . Bilateral pulmonary embolism ([place]) 03/10/2018  . Occult blood in stools 03/10/2018  . Tachycardia 01/31/2017  . Controlled diabetes mellitus type II without complication ([place]) 08/67/6195  . Hypertension 06/05/2014  . Irritable bowel syndrome 03/30/2010  . Dysmetabolic syndrome X 09/32/6712  . BACK PAIN WITH RADICULOPATHY 06/09/2007  . Hyperlipidemia 05/12/2007  . ALLERGIC RHINITIS 05/12/2007    Past Surgical History:  Procedure Laterality Date  . BIOPSY  03/15/2018   Procedure: BIOPSY;  Surgeon: Milus Banister, MD;  Location: [name];  Service: Endoscopy;;  . BIOPSY  03/01/2019   Procedure: BIOPSY;  Surgeon: Ladene Artist, MD;  Location: [facility] ENDOSCOPY;  Service: Endoscopy;;  . BOWEL RESECTION N/A 08/25/2018   Procedure: LAPROSCOPIC LOOP ILEOSTOMY;  Surgeon: Greer Pickerel, MD;  Location: [facility] ORS;  Service: General;  Laterality: N/A;  . CLEFT PALATE REPAIR    . COLONOSCOPY N/A 03/15/2018   Procedure: COLONOSCOPY;  Surgeon: Milus Banister, MD;  Location: [facility] ENDOSCOPY;  Service: Endoscopy;  Laterality: N/A;  . COLONOSCOPY N/A 08/20/2018   Procedure: COLONOSCOPY;  Surgeon: Irene Shipper, MD;  Location: [facility] ENDOSCOPY;  Service: Endoscopy;  Laterality: N/A;  . deviated septum repair  slight improvement  . ESOPHAGOGASTRODUODENOSCOPY N/A 03/15/2018   Procedure: ESOPHAGOGASTRODUODENOSCOPY (EGD);  Surgeon: Milus Banister, MD;  Location: [name];  Service: Endoscopy;  Laterality: N/A;  . FLEXIBLE SIGMOIDOSCOPY N/A 03/01/2019   Procedure: FLEXIBLE SIGMOIDOSCOPY;  Surgeon: Ladene Artist, MD;  Location: [facility] ENDOSCOPY;  Service: Endoscopy;  Laterality: N/A;  . ILEOSCOPY N/A  03/01/2019   Procedure: ILEOSCOPY THROUGH STOMA;  Surgeon: Ladene Artist, MD;  Location: [facility] ENDOSCOPY;  Service: Endoscopy;  Laterality: N/A;  . IR IMAGING GUIDED PORT INSERTION  03/17/2018  . LAPAROSCOPY N/A 08/25/2018   Procedure: LAPAROSCOPY DIAGNOSTIC;  Surgeon: Greer Pickerel, MD;  Location: [facility] ORS;  Service: General;  Laterality: N/A;  . TONSILLECTOMY          Home Medications    Prior to Admission medications   Medication Sig Start Date End Date Taking? Authorizing Provider  acetaminophen (TYLENOL) 325 MG tablet Take 2 tablets (650 mg total) by mouth every 6 (six) hours as needed for mild pain (or Fever >/= 101). 03/06/19  Yes Arrien, Jimmy Picket, MD  amoxicillin-clavulanate (AUGMENTIN) 875-125 MG tablet Take 1 tablet by mouth 2 (two) times daily. 06/21/19  Yes Carlyle Basques, MD  apixaban (ELIQUIS) 5 MG TABS tablet DO NOT TAKE UNTIL SEEN BY YOUR ONCOLOGIST Patient taking differently: Take 5 mg by mouth 2 (two) times daily. DO NOT TAKE UNTIL SEEN BY YOUR ONCOLOGIST 01/16/19  Yes Oretha Milch D, MD  B Complex Vitamins (VITAMIN B COMPLEX PO) Take by mouth.   Yes [provider]  citalopram (CELEXA) 20 MG tablet Take 1 tablet (20 mg total) by mouth daily. 04/17/19 04/11/20 Yes [name], MD  dexamethasone (DECADRON) 1 MG tablet Take 1 tablet (1 mg total) by mouth every morning. Patient taking differently: Take 1 mg by mouth. Jory Sims, Friday 05/11/19  Yes [name], MD  HYDROcodone-acetaminophen [facility]) 10-325 MG tablet Take 1 tablet by mouth every 6 (six) hours as needed for moderate pain or severe pain. 03/22/19  Yes [name], MD  magnesium oxide (MAG-OX) 400 (241.3 Mg) MG tablet Take 1 tablet (400 mg total) by mouth 2 (two) times daily. 03/27/19  Yes [name], MD  metroNIDAZOLE (FLAGYL) 500 MG tablet Take 1 tablet (500 mg total) by mouth 3 (three) times daily. 06/14/19  Yes Carlyle Basques, MD  Zinc Sulfate 220 (50 Zn) MG TABS Take 1 tablet  by mouth 2 (two) times daily. 03/06/19  Yes [provider]  blood glucose meter kit and supplies Dispense based on patient and insurance preference. Use daily as directed. (E11.9). 03/31/18   [name], MD  dronabinol (MARINOL) 5 MG capsule Take 1 capsule (5 mg total) by mouth 2 (two) times daily before lunch and supper. Patient taking differently: Take 5 mg by mouth daily.  03/06/19   Arrien, Jimmy Picket, MD  glucose blood (FREESTYLE TEST STRIPS) test strip Use to check blood sugar daily 03/30/18   [name], MD    Family History Family History  Problem Relation Age of Onset  . Lung cancer Mother        smoker  . Brain cancer Mother        metastasis  . AAA (abdominal aortic aneurysm) Father        smoker    Social History Social History   Tobacco Use  . Smoking status: Never Smoker  . Smokeless tobacco: Never Used  Substance Use Topics  . Alcohol use: Yes  Comment: occasional  . Drug use: No     Allergies   Ciprofloxacin   Review of Systems Review of Systems  Constitutional: Positive for fever.   All other systems reviewed and are negative except that which was mentioned in HPI   Physical Exam Updated Vital Signs BP 118/77   Pulse 100   Temp 99.8 F (37.7 C) (Oral)   Resp 19   Ht 5' 9"  (1.753 m)   Wt 88 kg   SpO2 98%   BMI 28.65 kg/m   Physical Exam Vitals signs and nursing note reviewed.  Constitutional:      General: He is not in acute distress.    Appearance: He is well-developed.  HENT:     Head: Normocephalic and atraumatic.     Mouth/Throat:     Mouth: Mucous membranes are moist.     Pharynx: Oropharynx is clear.  Eyes:     Conjunctiva/sclera: Conjunctivae normal.  Neck:     Musculoskeletal: Neck supple.  Cardiovascular:     Rate and Rhythm: Regular rhythm. Tachycardia present.     Heart sounds: Normal heart sounds. No murmur.  Pulmonary:     Effort: Pulmonary effort is normal.     Breath sounds: Normal  breath sounds.  Abdominal:     General: Bowel sounds are normal. There is no distension.     Palpations: Abdomen is soft.     Tenderness: There is abdominal tenderness (LLQ). There is no guarding or rebound.     Comments: Colostomy in place w/ liquid brown stool  Musculoskeletal:     Right lower leg: Edema present.     Left lower leg: Edema present.  Skin:    General: Skin is warm and dry.  Neurological:     Mental Status: He is alert and oriented to person, place, and time.     Comments: Fluent speech  Psychiatric:        Mood and Affect: Mood normal.        Judgment: Judgment normal.      ED Treatments / Results  Labs (all labs ordered are listed, but only abnormal results are displayed) Labs Reviewed  COMPREHENSIVE METABOLIC PANEL - Abnormal; Notable for the following components:      Result Value   Sodium 133 (*)    Potassium 3.3 (*)    CO2 20 (*)    Glucose, Bld 156 (*)    All other components within normal limits  CBC WITH DIFFERENTIAL/PLATELET - Abnormal; Notable for the following components:   WBC 0.8 (*)    RBC 3.30 (*)    Hemoglobin 11.7 (*)    HCT 35.1 (*)    MCV 106.4 (*)    MCH 35.5 (*)    RDW 15.9 (*)    Platelets 139 (*)    Lymphs Abs 0.0 (*)    Monocytes Absolute 0.0 (*)    All other components within normal limits  URINALYSIS, ROUTINE W REFLEX MICROSCOPIC - Abnormal; Notable for the following components:   Color, Urine AMBER (*)    APPearance CLOUDY (*)    All other components within normal limits  CULTURE, BLOOD (ROUTINE X 2)  CULTURE, BLOOD (ROUTINE X 2)  URINE CULTURE  SARS CORONAVIRUS 2 (TAT 6-24 HRS)  LACTIC ACID, PLASMA  LACTIC ACID, PLASMA    EKG None  Radiology Dg Chest Port 1 View  Result Date: 07/02/2019 CLINICAL DATA:  Neutropenic fever EXAM: PORTABLE CHEST 1 VIEW COMPARISON:  08/24/2018, 05/04/2019 FINDINGS: Right-sided central  venous port tip over the cavoatrial region. Low lung volumes. Atelectasis or scarring in the  bilateral mid to lower lungs. Stable cardiomediastinal silhouette. No pneumothorax. IMPRESSION: No active disease. Low lung volumes with scarring and or atelectasis in the mid to lower lung zones. Electronically Signed   By: Donavan Foil M.D.   On: 07/02/2019 23:18    Procedures Procedures (including critical care time)  Medications Ordered in ED Medications  piperacillin-tazobactam (ZOSYN) IVPB 3.375 g (0 g Intravenous Stopped 07/02/19 2347)  sodium chloride 0.9 % bolus 1,000 mL (1,000 mLs Intravenous New Bag/Given 07/02/19 2320)     Initial Impression / Assessment and Plan / ED Course  I have reviewed the triage vital signs and the nursing notes.  Pertinent labs & imaging results that were available during my care of the patient were reviewed by me and considered in my medical decision making (see chart for details).        Nontoxic on exam, mildly tachycardic on arrival.  Focal left lower quadrant tenderness which wife states is his usual area of abdominal pain.  CBC showed WBC 0.8.  Because of his tachycardia, neutropenia, and complicated history, initiated sepsis protocol with blood and urine cultures, Zosyn to treat intra-abdominal source, and IV fluid bolus.  CMP and lactate reassuring.  Chest x-ray negative.  I have ordered CT of abdomen pelvis to evaluate for abscess or perforation.  I am signing out to oncoming provider pending imaging results.  I anticipate admission.  Final Clinical Impressions(s) / ED Diagnoses   Final diagnoses:  None    ED Discharge Orders    None       Adante Courington, Wenda Overland, MD 07/03/19 0004

## 2019-07-02 NOTE — ED Triage Notes (Signed)
Patient reports fevers at home today. Afebrile in triage. Reports hx of abscess at colostomy site. Denies cough, SOB, chest pain, N/V.

## 2019-07-02 NOTE — ED Notes (Signed)
Date and time results received: 07/02/19 10:19 PM  (use smartphrase ".now" to insert current time)  Test: WBC Critical Value: 0.8  Name of Provider Notified: Dr.Little  Orders Received? Or Actions Taken?:

## 2019-07-03 ENCOUNTER — Encounter (HOSPITAL_COMMUNITY): Payer: Self-pay

## 2019-07-03 ENCOUNTER — Other Ambulatory Visit: Payer: Self-pay

## 2019-07-03 DIAGNOSIS — Z7901 Long term (current) use of anticoagulants: Secondary | ICD-10-CM | POA: Diagnosis not present

## 2019-07-03 DIAGNOSIS — D539 Nutritional anemia, unspecified: Secondary | ICD-10-CM | POA: Diagnosis present

## 2019-07-03 DIAGNOSIS — I10 Essential (primary) hypertension: Secondary | ICD-10-CM | POA: Diagnosis present

## 2019-07-03 DIAGNOSIS — K5732 Diverticulitis of large intestine without perforation or abscess without bleeding: Secondary | ICD-10-CM

## 2019-07-03 DIAGNOSIS — D709 Neutropenia, unspecified: Secondary | ICD-10-CM | POA: Diagnosis present

## 2019-07-03 DIAGNOSIS — Z8572 Personal history of non-Hodgkin lymphomas: Secondary | ICD-10-CM | POA: Diagnosis not present

## 2019-07-03 DIAGNOSIS — Z792 Long term (current) use of antibiotics: Secondary | ICD-10-CM | POA: Diagnosis not present

## 2019-07-03 DIAGNOSIS — Z801 Family history of malignant neoplasm of trachea, bronchus and lung: Secondary | ICD-10-CM | POA: Diagnosis not present

## 2019-07-03 DIAGNOSIS — E119 Type 2 diabetes mellitus without complications: Secondary | ICD-10-CM | POA: Diagnosis present

## 2019-07-03 DIAGNOSIS — R5081 Fever presenting with conditions classified elsewhere: Secondary | ICD-10-CM | POA: Diagnosis present

## 2019-07-03 DIAGNOSIS — C8510 Unspecified B-cell lymphoma, unspecified site: Secondary | ICD-10-CM | POA: Diagnosis not present

## 2019-07-03 DIAGNOSIS — E876 Hypokalemia: Secondary | ICD-10-CM | POA: Diagnosis present

## 2019-07-03 DIAGNOSIS — D638 Anemia in other chronic diseases classified elsewhere: Secondary | ICD-10-CM | POA: Diagnosis present

## 2019-07-03 DIAGNOSIS — Z881 Allergy status to other antibiotic agents status: Secondary | ICD-10-CM | POA: Diagnosis not present

## 2019-07-03 DIAGNOSIS — A419 Sepsis, unspecified organism: Secondary | ICD-10-CM | POA: Diagnosis present

## 2019-07-03 DIAGNOSIS — Z933 Colostomy status: Secondary | ICD-10-CM | POA: Diagnosis not present

## 2019-07-03 DIAGNOSIS — R509 Fever, unspecified: Secondary | ICD-10-CM | POA: Diagnosis present

## 2019-07-03 DIAGNOSIS — Z9221 Personal history of antineoplastic chemotherapy: Secondary | ICD-10-CM | POA: Diagnosis not present

## 2019-07-03 DIAGNOSIS — Z86711 Personal history of pulmonary embolism: Secondary | ICD-10-CM | POA: Diagnosis not present

## 2019-07-03 DIAGNOSIS — I2699 Other pulmonary embolism without acute cor pulmonale: Secondary | ICD-10-CM

## 2019-07-03 DIAGNOSIS — D61818 Other pancytopenia: Secondary | ICD-10-CM | POA: Diagnosis present

## 2019-07-03 DIAGNOSIS — Z79891 Long term (current) use of opiate analgesic: Secondary | ICD-10-CM | POA: Diagnosis not present

## 2019-07-03 DIAGNOSIS — Z20828 Contact with and (suspected) exposure to other viral communicable diseases: Secondary | ICD-10-CM | POA: Diagnosis present

## 2019-07-03 DIAGNOSIS — Z923 Personal history of irradiation: Secondary | ICD-10-CM | POA: Diagnosis not present

## 2019-07-03 DIAGNOSIS — Z79899 Other long term (current) drug therapy: Secondary | ICD-10-CM | POA: Diagnosis not present

## 2019-07-03 DIAGNOSIS — E785 Hyperlipidemia, unspecified: Secondary | ICD-10-CM | POA: Diagnosis present

## 2019-07-03 DIAGNOSIS — Z808 Family history of malignant neoplasm of other organs or systems: Secondary | ICD-10-CM | POA: Diagnosis not present

## 2019-07-03 LAB — CBC WITH DIFFERENTIAL/PLATELET
Abs Immature Granulocytes: 0.01 10*3/uL (ref 0.00–0.07)
Basophils Absolute: 0 10*3/uL (ref 0.0–0.1)
Basophils Relative: 1 %
Eosinophils Absolute: 0 10*3/uL (ref 0.0–0.5)
Eosinophils Relative: 0 %
HCT: 30.4 % — ABNORMAL LOW (ref 39.0–52.0)
Hemoglobin: 9.9 g/dL — ABNORMAL LOW (ref 13.0–17.0)
Immature Granulocytes: 1 %
Lymphocytes Relative: 41 %
Lymphs Abs: 0.5 10*3/uL — ABNORMAL LOW (ref 0.7–4.0)
MCH: 35.1 pg — ABNORMAL HIGH (ref 26.0–34.0)
MCHC: 32.6 g/dL (ref 30.0–36.0)
MCV: 107.8 fL — ABNORMAL HIGH (ref 80.0–100.0)
Monocytes Absolute: 0.7 10*3/uL (ref 0.1–1.0)
Monocytes Relative: 56 %
Neutro Abs: 0 10*3/uL — ABNORMAL LOW (ref 1.7–7.7)
Neutrophils Relative %: 1 %
Platelets: 123 10*3/uL — ABNORMAL LOW (ref 150–400)
RBC: 2.82 MIL/uL — ABNORMAL LOW (ref 4.22–5.81)
RDW: 15.7 % — ABNORMAL HIGH (ref 11.5–15.5)
WBC: 1.2 10*3/uL — CL (ref 4.0–10.5)
nRBC: 0 % (ref 0.0–0.2)

## 2019-07-03 LAB — URINE CULTURE: Culture: NO GROWTH

## 2019-07-03 LAB — LACTIC ACID, PLASMA: Lactic Acid, Venous: 0.7 mmol/L (ref 0.5–1.9)

## 2019-07-03 LAB — SARS CORONAVIRUS 2 (TAT 6-24 HRS): SARS Coronavirus 2: NEGATIVE

## 2019-07-03 MED ORDER — SODIUM CHLORIDE 0.9% FLUSH
10.0000 mL | Freq: Two times a day (BID) | INTRAVENOUS | Status: DC
  Administered 2019-07-05: 10 mL

## 2019-07-03 MED ORDER — ALBUTEROL SULFATE (2.5 MG/3ML) 0.083% IN NEBU: 2.5000 mg | INHALATION_SOLUTION | RESPIRATORY_TRACT | Status: DC | PRN

## 2019-07-03 MED ORDER — CHLORHEXIDINE GLUCONATE CLOTH 2 % EX PADS
6.0000 | MEDICATED_PAD | Freq: Every day | CUTANEOUS | Status: DC
  Administered 2019-07-03 – 2019-07-05 (×3): 6 via TOPICAL

## 2019-07-03 MED ORDER — ONDANSETRON HCL 4 MG PO TABS: 4.0000 mg | ORAL_TABLET | Freq: Four times a day (QID) | ORAL | Status: DC | PRN

## 2019-07-03 MED ORDER — DEXAMETHASONE 0.5 MG PO TABS
1.0000 mg | ORAL_TABLET | Freq: Every morning | ORAL | Status: DC
  Administered 2019-07-03 – 2019-07-05 (×3): 1 mg via ORAL
  Filled 2019-07-03 (×3): qty 2

## 2019-07-03 MED ORDER — SODIUM CHLORIDE 0.9% FLUSH: 10.0000 mL | INTRAVENOUS | Status: DC | PRN

## 2019-07-03 MED ORDER — MAGNESIUM OXIDE 400 (241.3 MG) MG PO TABS
400.0000 mg | ORAL_TABLET | Freq: Two times a day (BID) | ORAL | Status: DC
  Administered 2019-07-03 – 2019-07-05 (×5): 400 mg via ORAL
  Filled 2019-07-03 (×5): qty 1

## 2019-07-03 MED ORDER — PIPERACILLIN-TAZOBACTAM 3.375 G IVPB
3.3750 g | Freq: Three times a day (TID) | INTRAVENOUS | Status: DC
  Administered 2019-07-03 – 2019-07-05 (×7): 3.375 g via INTRAVENOUS
  Filled 2019-07-03 (×7): qty 50

## 2019-07-03 MED ORDER — HYDROCODONE-ACETAMINOPHEN 10-325 MG PO TABS: 1.0000 | ORAL_TABLET | Freq: Four times a day (QID) | ORAL | Status: DC | PRN

## 2019-07-03 MED ORDER — POTASSIUM CHLORIDE IN NACL 20-0.9 MEQ/L-% IV SOLN
INTRAVENOUS | Status: AC
  Administered 2019-07-03: 03:00:00 via INTRAVENOUS
  Filled 2019-07-03: qty 1000

## 2019-07-03 MED ORDER — ACETAMINOPHEN 650 MG RE SUPP: 650.0000 mg | Freq: Four times a day (QID) | RECTAL | Status: DC | PRN

## 2019-07-03 MED ORDER — CITALOPRAM HYDROBROMIDE 20 MG PO TABS
20.0000 mg | ORAL_TABLET | Freq: Every day | ORAL | Status: DC
  Administered 2019-07-03 – 2019-07-05 (×3): 20 mg via ORAL
  Filled 2019-07-03 (×3): qty 1

## 2019-07-03 MED ORDER — POLYETHYLENE GLYCOL 3350 17 G PO PACK: 17.0000 g | PACK | Freq: Every day | ORAL | Status: DC | PRN

## 2019-07-03 MED ORDER — APIXABAN 5 MG PO TABS
5.0000 mg | ORAL_TABLET | Freq: Two times a day (BID) | ORAL | Status: DC
  Administered 2019-07-03 – 2019-07-05 (×5): 5 mg via ORAL
  Filled 2019-07-03 (×5): qty 1

## 2019-07-03 MED ORDER — ZINC SULFATE 220 (50 ZN) MG PO CAPS
220.0000 mg | ORAL_CAPSULE | Freq: Two times a day (BID) | ORAL | Status: DC
  Administered 2019-07-03 – 2019-07-05 (×4): 220 mg via ORAL
  Filled 2019-07-03 (×4): qty 1

## 2019-07-03 MED ORDER — DRONABINOL 5 MG PO CAPS
5.0000 mg | ORAL_CAPSULE | Freq: Every day | ORAL | Status: DC
  Administered 2019-07-03 – 2019-07-05 (×3): 5 mg via ORAL
  Filled 2019-07-03 (×3): qty 1

## 2019-07-03 MED ORDER — SODIUM CHLORIDE 0.9 % IV SOLN
INTRAVENOUS | Status: DC
  Administered 2019-07-03 – 2019-07-04 (×4): via INTRAVENOUS

## 2019-07-03 MED ORDER — IOHEXOL 300 MG/ML  SOLN
100.0000 mL | Freq: Once | INTRAMUSCULAR | Status: AC | PRN
  Administered 2019-07-03: 100 mL via INTRAVENOUS

## 2019-07-03 MED ORDER — ZINC SULFATE 220 (50 ZN) MG PO TABS: 1.0000 | ORAL_TABLET | Freq: Two times a day (BID) | ORAL | Status: DC

## 2019-07-03 MED ORDER — ONDANSETRON HCL 4 MG/2ML IJ SOLN: 4.0000 mg | Freq: Four times a day (QID) | INTRAMUSCULAR | Status: DC | PRN

## 2019-07-03 MED ORDER — ACETAMINOPHEN 325 MG PO TABS
650.0000 mg | ORAL_TABLET | Freq: Four times a day (QID) | ORAL | Status: DC | PRN
  Administered 2019-07-03: 650 mg via ORAL
  Filled 2019-07-03: qty 2

## 2019-07-03 NOTE — ED Notes (Signed)
ED TO INPATIENT HANDOFF REPORT  ED Nurse Name and Phone #:  Grandville Silos, RN   S Name/Age/Gender [name] 63 y.o. male Room/Bed: WA11/WA11  Code Status   Code Status: Prior  Home/SNF/Other Home Patient oriented to: self, place, time and situation Is this baseline? Yes   Triage Complete: Triage complete  Chief Complaint Fever and history of abcess  Triage Note Patient reports fevers at home today. Afebrile in triage. Reports hx of abscess at colostomy site. Denies cough, SOB, chest pain, N/V.   Allergies Allergies  Allergen Reactions  . Ciprofloxacin Other (See Comments)    Leg tingling    Level of Care/Admitting Diagnosis ED Disposition    ED Disposition Condition Comment   Admit  Hospital Area: [facility] [[id number]]  Level of Care: Med-Surg [16]  Covid Evaluation: Asymptomatic Screening Protocol (No Symptoms)  Diagnosis: Diverticulitis large intestine [379024]  Admitting Physician: Vianne Bulls [[id number]]  Attending Physician: Vianne Bulls [[id number]]  Estimated length of stay: past midnight tomorrow  Certification:: I certify this patient will need inpatient services for at least 2 midnights  PT Class (Do Not Modify): Inpatient [101]  PT Acc Code (Do Not Modify): Private [1]       B Medical/Surgery History Past Medical History:  Diagnosis Date  . ALLERGIC RHINITIS   . Cancer ([place])    Lymphoma   . Diabetes mellitus   . Hyperlipidemia    Past Surgical History:  Procedure Laterality Date  . BIOPSY  03/15/2018   Procedure: BIOPSY;  Surgeon: Milus Banister, MD;  Location: [name];  Service: Endoscopy;;  . BIOPSY  03/01/2019   Procedure: BIOPSY;  Surgeon: Ladene Artist, MD;  Location: [facility];  Service: Endoscopy;;  . BOWEL RESECTION N/A 08/25/2018   Procedure: LAPROSCOPIC LOOP ILEOSTOMY;  Surgeon: Greer Pickerel, MD;  Location: [facility];  Service: General;  Laterality: N/A;  . CLEFT PALATE REPAIR    . COLONOSCOPY N/A  03/15/2018   Procedure: COLONOSCOPY;  Surgeon: Milus Banister, MD;  Location: [facility];  Service: Endoscopy;  Laterality: N/A;  . COLONOSCOPY N/A 08/20/2018   Procedure: COLONOSCOPY;  Surgeon: Irene Shipper, MD;  Location: [facility];  Service: Endoscopy;  Laterality: N/A;  . deviated septum repair     slight improvement  . ESOPHAGOGASTRODUODENOSCOPY N/A 03/15/2018   Procedure: ESOPHAGOGASTRODUODENOSCOPY (EGD);  Surgeon: Milus Banister, MD;  Location: [name];  Service: Endoscopy;  Laterality: N/A;  . FLEXIBLE SIGMOIDOSCOPY N/A 03/01/2019   Procedure: FLEXIBLE SIGMOIDOSCOPY;  Surgeon: Ladene Artist, MD;  Location: [facility];  Service: Endoscopy;  Laterality: N/A;  . ILEOSCOPY N/A 03/01/2019   Procedure: ILEOSCOPY THROUGH STOMA;  Surgeon: Ladene Artist, MD;  Location: [facility];  Service: Endoscopy;  Laterality: N/A;  . IR IMAGING GUIDED PORT INSERTION  03/17/2018  . LAPAROSCOPY N/A 08/25/2018   Procedure: LAPAROSCOPY DIAGNOSTIC;  Surgeon: Greer Pickerel, MD;  Location: [facility];  Service: General;  Laterality: N/A;  . TONSILLECTOMY       A IV Location/Drains/Wounds Patient Lines/Drains/Airways Status   Active Line/Drains/Airways    Name:   Placement date:   Placement time:   Site:   Days:   Implanted Port 03/27/18 Right Chest   03/27/18    1141    Chest   463   Ileostomy Loop RUQ   08/25/18    1138    RUQ   312   Incision (Closed) 08/25/18 Abdomen Other (Comment)   08/25/18  1120     312   Incision - 3 Ports Abdomen Umbilicus Left;Upper Left   08/25/18    1115     312   Pressure Injury 01/04/19 Stage II -  Partial thickness loss of dermis presenting as a shallow open ulcer with a red, pink wound bed without slough. redness around wound bed   01/04/19    0930     180   Pressure Injury 02/10/19 Sacrum Mid Stage II -  Partial thickness loss of dermis presenting as a shallow open ulcer with a red, pink wound bed without slough. partial thickness of dermis, first layer of skin  off presenting red/pink in color   02/10/19    1043     143          Intake/Output Last 24 hours  Intake/Output Summary (Last 24 hours) at 07/03/2019 0515 Last data filed at 07/03/2019 0044 Gross per 24 hour  Intake 1100 ml  Output -  Net 1100 ml    Labs/Imaging Results for orders placed or performed during the hospital encounter of 07/02/19 (from the past 48 hour(s))  Urinalysis, Routine w reflex microscopic     Status: Abnormal   Collection Time: 07/02/19  8:40 PM  Result Value Ref Range   Color, Urine AMBER (A) YELLOW    Comment: BIOCHEMICALS MAY BE AFFECTED BY COLOR   APPearance CLOUDY (A) CLEAR   Specific Gravity, Urine 1.009 1.005 - 1.030   pH 5.0 5.0 - 8.0   Glucose, UA NEGATIVE NEGATIVE mg/dL   Hgb urine dipstick NEGATIVE NEGATIVE   Bilirubin Urine NEGATIVE NEGATIVE   Ketones, ur NEGATIVE NEGATIVE mg/dL   Protein, ur NEGATIVE NEGATIVE mg/dL   Nitrite NEGATIVE NEGATIVE   Leukocytes,Ua NEGATIVE NEGATIVE    Comment: Performed at [facility], [place] [address], [place], [place] [address]  Lactic acid, plasma     Status: None   Collection Time: 07/02/19  9:02 PM  Result Value Ref Range   Lactic Acid, Venous 1.3 0.5 - 1.9 mmol/L    Comment: Performed at [facility], [place] [address], [place], [place] [address]  Comprehensive metabolic panel     Status: Abnormal   Collection Time: 07/02/19  9:02 PM  Result Value Ref Range   Sodium 133 (L) 135 - 145 mmol/L   Potassium 3.3 (L) 3.5 - 5.1 mmol/L   Chloride 100 98 - 111 mmol/L   CO2 20 (L) 22 - 32 mmol/L   Glucose, Bld 156 (H) 70 - 99 mg/dL   BUN 9 8 - 23 mg/dL   Creatinine, Ser 0.64 0.61 - 1.24 mg/dL   Calcium 9.2 8.9 - 10.3 mg/dL   Total Protein 7.1 6.5 - 8.1 g/dL   Albumin 3.6 3.5 - 5.0 g/dL   AST 17 15 - 41 U/L   ALT 23 0 - 44 U/L   Alkaline Phosphatase 59 38 - 126 U/L   Total Bilirubin 1.1 0.3 - 1.2 mg/dL   GFR calc non Af Amer >60 >60 mL/min   GFR calc Af Amer >60 >60  mL/min   Anion gap 13 5 - 15    Comment: Performed at [facility], [place] [address], [place], [place] [address]  CBC with Differential     Status: Abnormal   Collection Time: 07/02/19  9:02 PM  Result Value Ref Range   WBC 0.8 (LL) 4.0 - 10.5 K/uL    Comment: REPEATED TO VERIFY WHITE COUNT CONFIRMED ON SMEAR  THIS CRITICAL RESULT HAS VERIFIED AND BEEN CALLED TO HODGES,I BY POTEAT,SHANNON ON 10 19 2020 AT 2218, AND HAS BEEN READ BACK. CRITICAL RESULT VERIFIED    RBC 3.30 (L) 4.22 - 5.81 MIL/uL   Hemoglobin 11.7 (L) 13.0 - 17.0 g/dL   HCT 35.1 (L) 39.0 - 52.0 %   MCV 106.4 (H) 80.0 - 100.0 fL   MCH 35.5 (H) 26.0 - 34.0 pg   MCHC 33.3 30.0 - 36.0 g/dL   RDW 15.9 (H) 11.5 - 15.5 %   Platelets 139 (L) 150 - 400 K/uL   nRBC 0.0 0.0 - 0.2 %   Neutrophils Relative % 0 %   Lymphocytes Relative 0 %   Monocytes Relative 0 %   Eosinophils Relative 0 %   Basophils Relative 0 %   Band Neutrophils 0 %   nRBC 0 0 /100 WBC   Lymphs Abs 0.0 (L) 0.7 - 4.0 K/uL   Monocytes Absolute 0.0 (L) 0.1 - 1.0 K/uL   Eosinophils Absolute 0.0 0.0 - 0.5 K/uL   Basophils Absolute 0.0 0.0 - 0.1 K/uL   Abs Immature Granulocytes 0.00 0.00 - 0.07 K/uL   WBC Morphology TOO FEW TO COUNT, SMEAR AVAILABLE FOR REVIEW     Comment: RARE MONO, LYMPH SEEN Performed at [place] [address] [place], [place] [address]   Lactic acid, plasma     Status: None   Collection Time: 07/02/19 11:22 PM  Result Value Ref Range   Lactic Acid, Venous 0.7 0.5 - 1.9 mmol/L    Comment: Performed at [facility], [place] [address], [place], [place] [address]   Ct Abdomen Pelvis W Contrast  Result Date: 07/03/2019 CLINICAL DATA:  Abdominal pain and fever. History of abscess. Burkitt's lymphoma with colostomy. EXAM: CT ABDOMEN AND PELVIS WITH CONTRAST TECHNIQUE: Multidetector CT imaging of the abdomen and pelvis was performed using the standard protocol following bolus  administration of intravenous contrast. CONTRAST:  170m OMNIPAQUE IOHEXOL 300 MG/ML  SOLN COMPARISON:  CT 05/24/2019 FINDINGS: Lower chest: Subsegmental atelectasis or scarring in both lower lobes. No pleural fluid. Hepatobiliary: Tiny hypodense lesion in the right dome of the liver prior exam is not as well visualized currently. There is a tiny 4 mm hypodensity in the inferior liver that is unchanged. No calcified gallstone or pericholecystic inflammation. No biliary dilatation. Pancreas: No ductal dilatation or inflammation. Spleen: Normal in size without focal abnormality. Adrenals/Urinary Tract: No adrenal nodule. Nonobstructing stone in the right mid kidney. No hydronephrosis. Mild symmetric bilateral perinephric edema is unchanged. Small subcentimeter low-density lesions in both kidneys are too small to accurately characterize but likely small cysts. Urinary bladder is partially distended. There is wall thickening about the left aspect of the bladder dome, bladder dome is tented into the left upper pelvis. Appearance is unchanged from previous. Stomach/Bowel: Bowel evaluation is limited in the absence of enteric contrast. Stomach is nondistended. No evidence of small bowel obstruction. Matted loops of small bowel in continuity with the descending colon in the left lower quadrant, series 2, image 52, unchanged from comparison exams. Right lower quadrant loop ileostomy. Persistent sigmoid colonic wall thickening with improvement in pericolonic edema from prior exam. Persistent inflammatory changes and stranding in the right lower quadrant. Persistent tethering of the sigmoid and ascending colon, series 2, image 61, not significantly changed from prior. No new colonic inflammation. Vascular/Lymphatic: Mild aortic atherosclerosis. No aneurysm. Patent portal vein. No enlarged lymph nodes in the abdomen or pelvis. Reproductive: Prostate  is unremarkable. Other: Persistent right lower quadrant soft tissue stranding.  No evidence of abscess. No ascites or free air. Musculoskeletal: There are no acute or suspicious osseous abnormalities. IMPRESSION: 1. Persistent sigmoid colonic wall thickening, with slight improvement in inflammatory change from CT last month. There is otherwise no significant interval change from prior exams with potential fistulous communication versus tethering of the sigmoid colon to the adjacent ascending colon as well as tethering versus fistula with loops of small bowel in the left lower quadrant. 2. No intra-abdominal abscess.  No other new abnormality. 3. Nonobstructing right renal stone. Electronically Signed   By: Keith Rake M.D.   On: 07/03/2019 00:54   Dg Chest Port 1 View  Result Date: 07/02/2019 CLINICAL DATA:  Neutropenic fever EXAM: PORTABLE CHEST 1 VIEW COMPARISON:  08/24/2018, 05/04/2019 FINDINGS: Right-sided central venous port tip over the cavoatrial region. Low lung volumes. Atelectasis or scarring in the bilateral mid to lower lungs. Stable cardiomediastinal silhouette. No pneumothorax. IMPRESSION: No active disease. Low lung volumes with scarring and or atelectasis in the mid to lower lung zones. Electronically Signed   By: Donavan Foil M.D.   On: 07/02/2019 23:18    Pending Labs Unresulted Labs (From admission, onward)    Start     Ordered   07/03/19 0500  CBC with Differential/Platelet  Tomorrow morning,   R     07/03/19 0433   07/02/19 2259  SARS CORONAVIRUS 2 (TAT 6-24 HRS) Nasopharyngeal Nasopharyngeal Swab  (Asymptomatic/Tier 2 Patients Labs)  Once,   STAT    Question Answer Comment  Is this test for diagnosis or screening Screening   Symptomatic for COVID-19 as defined by CDC No   Hospitalized for COVID-19 No   Admitted to ICU for COVID-19 No   Previously tested for COVID-19 Unknown   Resident in a congregate (group) care setting No   Employed in healthcare setting No      07/02/19 2258   07/02/19 2244  Blood Culture (routine x 2)  BLOOD CULTURE X 2,    STAT     07/02/19 2244   07/02/19 2244  Urine culture  ONCE - STAT,   STAT     07/02/19 2244          Vitals/Pain Today's Vitals   07/03/19 0307 07/03/19 0330 07/03/19 0400 07/03/19 0500  BP:  110/72 106/76 124/69  Pulse: 94 89 84 90  Resp: 12 16 14 18   Temp:      TempSrc:      SpO2: 99% 98% 97% 97%  Weight:      Height:      PainSc:        Isolation Precautions No active isolations  Medications Medications  0.9 % NaCl with KCl 20 mEq/ L  infusion ( Intravenous New Bag/Given 07/03/19 0306)  piperacillin-tazobactam (ZOSYN) IVPB 3.375 g (has no administration in time range)  piperacillin-tazobactam (ZOSYN) IVPB 3.375 g (0 g Intravenous Stopped 07/02/19 2347)  sodium chloride 0.9 % bolus 1,000 mL (0 mLs Intravenous Stopped 07/03/19 0044)  sodium chloride (PF) 0.9 % injection (  Given by Other 07/03/19 0045)  iohexol (OMNIPAQUE) 300 MG/ML solution 100 mL (100 mLs Intravenous Contrast Given 07/03/19 0011)    Mobility walks Low fall risk   Focused Assessments Cardiac Assessment Handoff:    Lab Results  Component Value Date   TROPONINI <0.03 03/11/2018   Lab Results  Component Value Date   DDIMER 2.48 (H) 01/08/2019   Does the Patient currently have  chest pain? No     R Recommendations: See Admitting Provider Note  Report given to:   Additional Notes:

## 2019-07-03 NOTE — ED Notes (Signed)
Collected second set of blood cultures from pt's right hand.

## 2019-07-03 NOTE — ED Notes (Signed)
Pt transported to CT ?

## 2019-07-03 NOTE — H&P (Signed)
History and Physical  Mark Frey KGY:[id number] DOB: 1956-08-21 DOA: 07/02/2019   Patient coming from: Home & is able to ambulate  Chief Complaint: Fever and abdominal pain  HPI: Mark Frey is a 63 y.o. 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  Patient reports pain is usually worse around his left lower quadrant, denies any nausea/vomiting, changes in his stool in his colostomy bag.  Patient denies any cough, congestion, chest pain, shortness of breath, dizziness.  Of note, patient has a history of recurrent diverticulitis (about 3 episodes this yr), most recently in [id number] which was complicated by E. coli bacteremia.  He has since remained on Augmentin.  At that admission, PET CT demonstrated wall thickening of the distal descending/proximal sigmoid colon consistent with likely diverticulitis and no evidence of recurrent lymphoma.  Patient is followed by general surgery Dr Victorio Palm at [facility] who has repeatedly recommended surgery (partial colectomy versus total colectomy), but patient has been refusing.  Patient follows with Dr. Ardis Hughs with [facility], and Dr. Graylon Good from [place].  Today, patient is also refusing surgery, he wants to think about it.    ED Course: Afebrile, tachycardic on presentation, saturating well on room air.  Labs showed WBC 0.8, neutropenic, hemoglobin 11.7, platelets 139, hypokalemia, BC X 2 pending, UA unremarkable, chest x-ray unremarkable, CT abd/pelvis showed persistent sigmoid colonic wall thickening, with slight improvement in inflammatory change from previous CT.  Patient admitted for further management  Review of  Systems: Review of systems are otherwise negative   Past Medical History:  Diagnosis Date   ALLERGIC RHINITIS    Cancer ([place])    Lymphoma    Diabetes mellitus    Hyperlipidemia    Past Surgical History:  Procedure Laterality Date   BIOPSY  03/15/2018   Procedure: BIOPSY;  Surgeon: Milus Banister, MD;  Location: [facility];  Service: Endoscopy;;   BIOPSY  03/01/2019   Procedure: BIOPSY;  Surgeon: Ladene Artist, MD;  Location: [facility];  Service: Endoscopy;;   BOWEL RESECTION N/A 08/25/2018   Procedure: LAPROSCOPIC LOOP ILEOSTOMY;  Surgeon: Greer Pickerel, MD;  Location: [name];  Service: General;  Laterality: N/A;   CLEFT PALATE REPAIR     COLONOSCOPY N/A 03/15/2018   Procedure: COLONOSCOPY;  Surgeon: Milus Banister, MD;  Location: [name];  Service: Endoscopy;  Laterality: N/A;   COLONOSCOPY N/A 08/20/2018   Procedure: COLONOSCOPY;  Surgeon: Irene Shipper, MD;  Location: [facility];  Service: Endoscopy;  Laterality: N/A;   deviated septum repair     slight improvement   ESOPHAGOGASTRODUODENOSCOPY N/A 03/15/2018   Procedure: ESOPHAGOGASTRODUODENOSCOPY (EGD);  Surgeon: Milus Banister, MD;  Location: [name];  Service: Endoscopy;  Laterality: N/A;   FLEXIBLE SIGMOIDOSCOPY N/A 03/01/2019   Procedure: FLEXIBLE SIGMOIDOSCOPY;  Surgeon: Ladene Artist, MD;  [facility]: [facility];  Service: Endoscopy;  Laterality: N/A;   ILEOSCOPY N/A 03/01/2019   Procedure: ILEOSCOPY THROUGH STOMA;  Surgeon: Ladene Artist, MD;  Location: [facility];  Service: Endoscopy;  Laterality: N/A;   IR IMAGING GUIDED PORT INSERTION  03/17/2018   LAPAROSCOPY N/A 08/25/2018   Procedure: LAPAROSCOPY DIAGNOSTIC;  Surgeon: Greer Pickerel, MD;  Location: [facility];  Service: General;  Laterality: N/A;   TONSILLECTOMY      Social History:  reports that he has never smoked. He has never used smokeless tobacco. He reports current alcohol use. He reports that he does not use  drugs.   Allergies  Allergen Reactions   Ciprofloxacin Other (See Comments)    Leg tingling    Family History  Problem Relation Age of Onset   Lung cancer Mother        smoker   Brain cancer Mother        metastasis   AAA (abdominal aortic aneurysm) Father        smoker      Prior to Admission medications   Medication Sig Start Date End Date Taking? Authorizing Provider  acetaminophen (TYLENOL) 325 MG tablet Take 2 tablets (650 mg total) by mouth every 6 (six) hours as needed for mild pain (or Fever >/= 101). 03/06/19  Yes Arrien, Jimmy Picket, MD  amoxicillin-clavulanate (AUGMENTIN) 875-125 MG tablet Take 1 tablet by mouth 2 (two) times daily. 06/21/19  Yes Carlyle Basques, MD  apixaban (ELIQUIS) 5 MG TABS tablet DO NOT TAKE UNTIL SEEN BY YOUR ONCOLOGIST Patient taking differently: Take 5 mg by mouth 2 (two) times daily. DO NOT TAKE UNTIL SEEN BY YOUR ONCOLOGIST 01/16/19  Yes Oretha Milch D, MD  B Complex Vitamins (VITAMIN B COMPLEX PO) Take by mouth.   Yes [provider]  citalopram (CELEXA) 20 MG tablet Take 1 tablet (20 mg total) by mouth daily. 04/17/19 04/11/20 Yes [name], MD  dexamethasone (DECADRON) 1 MG tablet Take 1 tablet (1 mg total) by mouth every morning. Patient taking differently: Take 1 mg by mouth. Jory Sims, Friday 05/11/19  Yes [name], MD  HYDROcodone-acetaminophen [facility]) 10-325 MG tablet Take 1 tablet by mouth every 6 (six) hours as needed for moderate pain or severe pain. 03/22/19  Yes [name], MD  magnesium oxide (MAG-OX) 400 (241.3 Mg) MG tablet Take 1 tablet (400 mg total) by mouth 2 (two) times daily. 03/27/19  Yes [name], MD  metroNIDAZOLE (FLAGYL) 500 MG tablet Take 1 tablet (500 mg total) by mouth 3 (three) times daily. 06/14/19  Yes Carlyle Basques, MD  Zinc Sulfate 220 (50 Zn) MG TABS Take 1 tablet by mouth 2 (two) times daily. 03/06/19  Yes [provider]  blood glucose meter kit and  supplies Dispense based on patient and insurance preference. Use daily as directed. (E11.9). 03/31/18   [name], MD  dronabinol (MARINOL) 5 MG capsule Take 1 capsule (5 mg total) by mouth 2 (two) times daily before lunch and supper. Patient taking differently: Take 5 mg by mouth daily.  03/06/19   Arrien, Jimmy Picket, MD  glucose blood (FREESTYLE TEST STRIPS) test strip Use to check blood sugar daily 03/30/18   [name], MD    Physical Exam: BP 116/76 (BP Location: Left Arm)    Pulse 87    Temp 99.4 F (37.4 C) (Oral)    Resp 17    Ht 5' 9" (1.753 m)    Wt 88 kg    SpO2 98%    BMI 28.65 kg/m   General: NAD Eyes: Normal ENT: Normal Neck: Supple Cardiovascular: S1, S2 present Respiratory: CTA B Abdomen: Soft, generalized tenderness worse on the LLQ, nondistended, bowel sounds present, colostomy bag noted with brownish liquid stool Skin: Normal Musculoskeletal: Trace bilateral pedal edema Psychiatric: Normal mood Neurologic: No obvious focal neurologic deficit  noted          Labs on Admission:  Basic Metabolic Panel: Recent Labs  Lab 07/02/19 2102  NA 133*  K 3.3*  CL 100  CO2 20*  GLUCOSE 156*  BUN 9  CREATININE 0.64  CALCIUM 9.2   Liver Function Tests: Recent Labs  Lab 07/02/19 2102  AST 17  ALT 23  ALKPHOS 59  BILITOT 1.1  PROT 7.1  ALBUMIN 3.6   No results for input(s): LIPASE, AMYLASE in the last 168 hours. No results for input(s): AMMONIA in the last 168 hours. CBC: Recent Labs  Lab 07/02/19 2102 07/03/19 0609  WBC 0.8* 1.2*  NEUTROABS  --  0.0*  HGB 11.7* 9.9*  HCT 35.1* 30.4*  MCV 106.4* 107.8*  PLT 139* 123*   Cardiac Enzymes: No results for input(s): CKTOTAL, CKMB, CKMBINDEX, TROPONINI in the last 168 hours.  BNP (last 3 results) No results for input(s): BNP in the last 8760 hours.  ProBNP (last 3 results) No results for input(s): PROBNP in the last 8760 hours.  CBG: No results for input(s): GLUCAP in the last 168  hours.  Radiological Exams on Admission: Ct Abdomen Pelvis W Contrast  Result Date: 07/03/2019 CLINICAL DATA:  Abdominal pain and fever. History of abscess. Burkitt's lymphoma with colostomy. EXAM: CT ABDOMEN AND PELVIS WITH CONTRAST TECHNIQUE: Multidetector CT imaging of the abdomen and pelvis was performed using the standard protocol following bolus administration of intravenous contrast. CONTRAST:  174m OMNIPAQUE IOHEXOL 300 MG/ML  SOLN COMPARISON:  CT 05/24/2019 FINDINGS: Lower chest: Subsegmental atelectasis or scarring in both lower lobes. No pleural fluid. Hepatobiliary: Tiny hypodense lesion in the right dome of the liver prior exam is not as well visualized currently. There is a tiny 4 mm hypodensity in the inferior liver that is unchanged. No calcified gallstone or pericholecystic inflammation. No biliary dilatation. Pancreas: No ductal dilatation or inflammation. Spleen: Normal in size without focal abnormality. Adrenals/Urinary Tract: No adrenal nodule. Nonobstructing stone in the right mid kidney. No hydronephrosis. Mild symmetric bilateral perinephric edema is unchanged. Small subcentimeter low-density lesions in both kidneys are too small to accurately characterize but likely small cysts. Urinary bladder is partially distended. There is wall thickening about the left aspect of the bladder dome, bladder dome is tented into the left upper pelvis. Appearance is unchanged from previous. Stomach/Bowel: Bowel evaluation is limited in the absence of enteric contrast. Stomach is nondistended. No evidence of small bowel obstruction. Matted loops of small bowel in continuity with the descending colon in the left lower quadrant, series 2, image 52, unchanged from comparison exams. Right lower quadrant loop ileostomy. Persistent sigmoid colonic wall thickening with improvement in pericolonic edema from prior exam. Persistent inflammatory changes and stranding in the right lower quadrant. Persistent  tethering of the sigmoid and ascending colon, series 2, image 61, not significantly changed from prior. No new colonic inflammation. Vascular/Lymphatic: Mild aortic atherosclerosis. No aneurysm. Patent portal vein. No enlarged lymph nodes in the abdomen or pelvis. Reproductive: Prostate is unremarkable. Other: Persistent right lower quadrant soft tissue stranding. No evidence of abscess. No ascites or free air. Musculoskeletal: There are no acute or suspicious osseous abnormalities. IMPRESSION: 1. Persistent sigmoid colonic wall thickening, with slight improvement in inflammatory change from CT last month. There is otherwise no significant interval change from prior exams with potential fistulous communication versus tethering of the sigmoid colon to the adjacent ascending colon as well as tethering versus fistula with loops of small bowel in the left lower  quadrant. 2. No intra-abdominal abscess.  No other new abnormality. 3. Nonobstructing right renal stone. Electronically Signed   By: Keith Rake M.D.   On: 07/03/2019 00:54   Dg Chest Port 1 View  Result Date: 07/02/2019 CLINICAL DATA:  Neutropenic fever EXAM: PORTABLE CHEST 1 VIEW COMPARISON:  08/24/2018, 05/04/2019 FINDINGS: Right-sided central venous port tip over the cavoatrial region. Low lung volumes. Atelectasis or scarring in the bilateral mid to lower lungs. Stable cardiomediastinal silhouette. No pneumothorax. IMPRESSION: No active disease. Low lung volumes with scarring and or atelectasis in the mid to lower lung zones. Electronically Signed   By: Donavan Foil M.D.   On: 07/02/2019 23:18    EKG: None  Assessment/Plan Present on Admission:  Diverticulitis large intestine  Neutropenia with fever (HCC)  Bilateral pulmonary embolism (HCC)  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HCC)  Principal Problem:   Neutropenia with fever (HCC) Active Problems:   Controlled diabetes mellitus type II without complication (HCC)   Bilateral pulmonary  embolism (HCC)   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HCC)   Diverticulitis large intestine   Sepsis 2/2 recurrent colonic diverticulitis Currently afebrile, neutropenic Lactic acid unremarkable BC x2 pending UA unremarkable, UC pending Chest x-ray unremarkable CT abdomen/pelvis showed persistent sigmoid colonic wall thickening, with slight improvement in inflammatory change from previous CT.  No abscess noted Continue IV Zosyn, IV fluids Patient has been on suppressive antibiotics chronically, by Dr. Graylon Good [facility], may consider speaking to her prior to discharge for direction of antibiotics Patient has a follow-up appointment with his surgeon at [facility] in about a month's time Monitor fever curve  Pancytopenia Chronic, unknown etiology No longer on any chemotherapy/radiation Neutropenic precautions Daily CBC  Anemia of chronic disease/macrocytic anemia Hemoglobin around baseline Daily CBC  Hypokalemia Replace as needed  History of B-cell lymphoma Intra-abdominal Burkitt's lymphoma Status post chemo/radiation, no longer on any treatment  History of PE Continue Eliquis     DVT prophylaxis: Eliquis  Code Status: Full  Family Communication: Spoke to wife in details on 07/03/2019  Disposition Plan: To be determined  Consults called: None  Admission status: Inpatient    Alma Friendly MD [facility]   If 7PM-7AM, please contact night-coverage [email or web address]  07/03/2019, 10:45 AM

## 2019-07-03 NOTE — Progress Notes (Signed)
Pharmacy Antibiotic Note  Mark Frey is a 63 y.o. male admitted on 07/02/2019 with Intra-abdominal infection.  Pharmacy has been consulted for zosyn dosing.  Plan: Zosyn 3.375g IV q8h (4 hour infusion).  Height: 5' 9"  (175.3 cm) Weight: 194 lb (88 kg) IBW/kg (Calculated) : 70.7  Temp (24hrs), Avg:99.8 F (37.7 C), Min:99.8 F (37.7 C), Max:99.8 F (37.7 C)  Recent Labs  Lab 07/02/19 2102 07/02/19 2322  WBC 0.8*  --   CREATININE 0.64  --   LATICACIDVEN 1.3 0.7    Estimated Creatinine Clearance: 105.1 mL/min (by C-G formula based on SCr of 0.64 mg/dL).    Allergies  Allergen Reactions  . Ciprofloxacin Other (See Comments)    Leg tingling    Antimicrobials this admission: Zosyn 07/03/2019 >>   Dose adjustments this admission: -  Microbiology results: -  Thank you for allowing pharmacy to be a part of this patient's care.  Nani Skillern Crowford 07/03/2019 2:18 AM

## 2019-07-03 NOTE — ED Notes (Signed)
Pt called out requesting to empty his colostomy bag. Pt advised he had never done it himself and didn't know how to do it. RN notified.

## 2019-07-03 NOTE — ED Provider Notes (Signed)
Care assumed from Dr. Rex Kras at shift change.  Patient presenting here with fever.  He has history of B-cell lymphoma and has been neutropenic in the past.  He appears to be neutropenic today.  He does have tenderness in the left lower quadrant, for which a CT scan was obtained.  This shows thickening of the sigmoid colonic wall, but no surgical abnormality.  Patient given IV antibiotics and will be admitted to the hospitalist service under the care of Dr. Myna Hidalgo.   Veryl Speak, MD 07/03/19 [phone]

## 2019-07-04 ENCOUNTER — Encounter (HOSPITAL_COMMUNITY): Payer: Self-pay

## 2019-07-04 DIAGNOSIS — D709 Neutropenia, unspecified: Secondary | ICD-10-CM

## 2019-07-04 DIAGNOSIS — R5081 Fever presenting with conditions classified elsewhere: Secondary | ICD-10-CM

## 2019-07-04 LAB — CBC
HCT: 29.4 % — ABNORMAL LOW (ref 39.0–52.0)
Hemoglobin: 9.6 g/dL — ABNORMAL LOW (ref 13.0–17.0)
MCH: 35.3 pg — ABNORMAL HIGH (ref 26.0–34.0)
MCHC: 32.7 g/dL (ref 30.0–36.0)
MCV: 108.1 fL — ABNORMAL HIGH (ref 80.0–100.0)
Platelets: 125 10*3/uL — ABNORMAL LOW (ref 150–400)
RBC: 2.72 MIL/uL — ABNORMAL LOW (ref 4.22–5.81)
RDW: 15.6 % — ABNORMAL HIGH (ref 11.5–15.5)
WBC: 1 10*3/uL — CL (ref 4.0–10.5)
nRBC: 0 % (ref 0.0–0.2)

## 2019-07-04 LAB — BASIC METABOLIC PANEL
Anion gap: 8 (ref 5–15)
BUN: 8 mg/dL (ref 8–23)
CO2: 22 mmol/L (ref 22–32)
Calcium: 8.5 mg/dL — ABNORMAL LOW (ref 8.9–10.3)
Chloride: 105 mmol/L (ref 98–111)
Creatinine, Ser: 0.49 mg/dL — ABNORMAL LOW (ref 0.61–1.24)
GFR calc Af Amer: >60 mL/min (ref >60)
GFR calc non Af Amer: >60 mL/min (ref >60)
Glucose, Bld: 128 mg/dL — ABNORMAL HIGH (ref 70–99)
Potassium: 3.1 mmol/L — ABNORMAL LOW (ref 3.5–5.1)
Sodium: 135 mmol/L (ref 135–145)

## 2019-07-04 MED ORDER — POTASSIUM CHLORIDE CRYS ER 20 MEQ PO TBCR
40.0000 meq | EXTENDED_RELEASE_TABLET | ORAL | Status: AC
  Administered 2019-07-04 (×2): 40 meq via ORAL
  Filled 2019-07-04 (×2): qty 2

## 2019-07-04 NOTE — Progress Notes (Signed)
PT Cancellation Note  Patient Details Name: Mark Frey MRN: [id number] DOB: 09/10/1956   Cancelled Treatment:    Reason Eval/Treat Not Completed: PT screened, no needs identified, will sign off. Pt and wife report they have been walking the halls and do not feel he needs acute PT.  He is currently receiving HHPT and would recommend continuing with this.  Will sign off.   Galen Manila 07/04/2019, 12:15 PM

## 2019-07-04 NOTE — Progress Notes (Signed)
[facility] Progress Note  Patient: JAFAR POFFENBERGER JKD:[id number]   PCP: [name], MD DOB: 1955/10/21   DOA: 07/02/2019   DOS: 07/04/2019   Date of Service: the patient was seen and examined on 07/04/2019  Chief Complaint  Patient presents with  . Fever     Brief hospital course: NEITHAN DAY is a 63 y.o. 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  Patient reports pain is usually worse around his left lower quadrant, denies any nausea/vomiting, changes in his stool in his colostomy bag.  Patient denies any cough, congestion, chest pain, shortness of breath, dizziness.  Of note, patient has a history of recurrent diverticulitis (about 3 episodes this yr), most recently in [id number] which was complicated by E. coli bacteremia.  He has since remained on Augmentin.  At that admission, PET CT demonstrated wall thickening of the distal descending/proximal sigmoid colon consistent with likely diverticulitis and no evidence of recurrent lymphoma.  Patient is followed by general surgery Dr Victorio Palm at [facility] who has repeatedly recommended surgery (partial colectomy versus total colectomy), but patient has been refusing.  Patient follows with Dr. Ardis Hughs with [facility], and Dr. Graylon Good from [facility].  Today, patient is also refusing surgery, he wants to think about it.  Currently further plan is monitor cultures and blood work for stability.  Subjective: Denies any acute complaint no nausea no vomiting but no further fever.  No chest pain abdominal pain.  No diarrhea no constipation.  No headache.  No dizziness no lightheadedness.  Assessment and Plan:  Sepsis 2/2 recurrent colonic diverticulitis Currently afebrile, neutropenic Lactic acid unremarkable BC x2 so far no growth UA unremarkable, UC so far no growth Chest x-ray unremarkable CT abdomen/pelvis showed persistent sigmoid colonic wall thickening, with slight improvement in inflammatory change from previous CT.  No abscess noted Continue IV Zosyn, IV fluids Patient has been on suppressive antibiotics chronically, by Dr. Graylon [facility] from [facility], may consider speaking to her prior to discharge for direction of antibiotics Patient has a follow-up appointment with his surgeon at [facility] in about a month's time Monitor fever curve  Pancytopenia Chronic, unknown etiology No longer on any chemotherapy/radiation Neutropenic precautions Daily CBC  Anemia of chronic disease/macrocytic anemia Hemoglobin around baseline Daily CBC  Hypokalemia Replace as needed  History of B-cell lymphoma Intra-abdominal Burkitt's lymphoma Status post chemo/radiation, no longer on any treatment Patient is currently agreeable for consideration of surgery at [facility] once he is leaving the hospital.  History of PE Continue Eliquis  Diet: Cardiac diet  DVT Prophylaxis: Therapeutic anticoagulation  Advance goals of care discussion: Full code  Family Communication: no family was present at bedside, at the time of interview.   Disposition:  Discharge to home.  Consultants: none Procedures: none  Scheduled Meds: . apixaban  5 mg Oral BID  . Chlorhexidine Gluconate Cloth  6 each Topical Daily  . citalopram  20 mg Oral Daily  . dexamethasone  1 mg Oral q morning - 10a  . dronabinol  5 mg Oral Daily  . magnesium oxide  400 mg Oral BID  . sodium chloride flush  10-40 mL Intracatheter Q12H  . zinc sulfate  220 mg Oral BID  Continuous Infusions: . sodium chloride 100 mL/hr at 07/04/19 1442  . piperacillin-tazobactam (ZOSYN)  IV 3.375 g (07/04/19 1442)   PRN Meds: acetaminophen **OR** acetaminophen,  albuterol, HYDROcodone-acetaminophen, ondansetron **OR** ondansetron (ZOFRAN) IV, polyethylene glycol, sodium chloride flush Antibiotics: Anti-infectives (From admission, onward)   Start     Dose/Rate Route Frequency Ordered Stop   07/03/19 0600  piperacillin-tazobactam (ZOSYN) IVPB 3.375 g     3.375 g 12.5 mL/hr over 240 Minutes Intravenous Every 8 hours 07/03/19 0218     07/02/19 2245  piperacillin-tazobactam (ZOSYN) IVPB 3.375 g     3.375 g 100 mL/hr over 30 Minutes Intravenous  Once 07/02/19 2244 07/02/19 2347       Objective: Physical Exam: Vitals:   07/04/19 0153 07/04/19 0532 07/04/19 0535 07/04/19 1507  BP: 120/75 116/79  124/79  Pulse: 90 86  (!) 105  Resp: 20 16  18   Temp: 98.2 F (36.8 C)  97.8 F (36.6 C) 98.3 F (36.8 C)  TempSrc: Oral  Oral Oral  SpO2: 97% 96%  97%  Weight:      Height:        Intake/Output Summary (Last 24 hours) at 07/04/2019 1722 Last data filed at 07/04/2019 1500 Gross per 24 hour  Intake 3083.76 ml  Output 425 ml  Net 2658.76 ml   Filed Weights   07/02/19 2010  Weight: 88 kg   General: alert and oriented to time, place, and person. Appear in mild distress, affect appropriate Eyes: PERRL, Conjunctiva normal ENT: Oral Mucosa Clear, moist  Neck: no JVD, no Abnormal Mass Or lumps Cardiovascular: S1 and S2 Present, no Murmur, peripheral pulses symmetrical Respiratory: good respiratory effort, Bilateral Air entry equal and Decreased, no signs of accessory muscle use, Clear to Auscultation, no Crackles, no wheezes Abdomen: Bowel Sound present, Soft and no tenderness, no hernia Skin: no rashes  Extremities: no Pedal edema, no calf tenderness Neurologic: without any new focal findings Gait not checked due to patient safety concerns  Data Reviewed: I have personally reviewed and interpreted daily labs, tele strips, imagings as discussed above. I reviewed all nursing notes, pharmacy notes, vitals, pertinent old records I have discussed  plan of care as described above with RN and patient/family.  CBC: Recent Labs  Lab 07/02/19 2102 07/03/19 0609 07/04/19 0335  WBC 0.8* 1.2* 1.0*  NEUTROABS  --  0.0*  --   HGB 11.7* 9.9* 9.6*  HCT 35.1* 30.4* 29.4*  MCV 106.4* 107.8* 108.1*  PLT 139* 123* 858*   Basic Metabolic Panel: Recent Labs  Lab 07/02/19 2102 07/04/19 0335  NA 133* 135  K 3.3* 3.1*  CL 100 105  CO2 20* 22  GLUCOSE 156* 128*  BUN 9 8  CREATININE 0.64 0.49*  CALCIUM 9.2 8.5*    Liver Function Tests: Recent Labs  Lab 07/02/19 2102  AST 17  ALT 23  ALKPHOS 59  BILITOT 1.1  PROT 7.1  ALBUMIN 3.6   No results for input(s): LIPASE, AMYLASE in the last 168 hours. No results for input(s): AMMONIA in the last 168 hours. Coagulation Profile: No results for input(s): INR, PROTIME in the last 168 hours. Cardiac Enzymes: No results for input(s): CKTOTAL, CKMB, CKMBINDEX, TROPONINI in the last 168 hours. BNP (last 3 results) No results for input(s): PROBNP in the last 8760 hours. CBG: No results for input(s): GLUCAP in the last 168 hours. Studies: No results found.   Time spent: 35 minutes  Author: Berle Mull, MD [facility] 07/04/2019 5:22 PM  To reach On-call, see care teams to locate the attending and reach out to them via [email or web address]. If 7PM-7AM, please contact night-coverage If you still have difficulty reaching the attending provider, please page the [facility] (Director on Call) for [facility] on amion for assistance.

## 2019-07-05 ENCOUNTER — Telehealth: Payer: Self-pay | Admitting: *Deleted

## 2019-07-05 LAB — CBC WITH DIFFERENTIAL/PLATELET
Abs Immature Granulocytes: 0 10*3/uL (ref 0.00–0.07)
Basophils Absolute: 0 10*3/uL (ref 0.0–0.1)
Basophils Relative: 1 %
Eosinophils Absolute: 0 10*3/uL (ref 0.0–0.5)
Eosinophils Relative: 1 %
HCT: 29.8 % — ABNORMAL LOW (ref 39.0–52.0)
Hemoglobin: 9.6 g/dL — ABNORMAL LOW (ref 13.0–17.0)
Immature Granulocytes: 0 %
Lymphocytes Relative: 37 %
Lymphs Abs: 0.4 10*3/uL — ABNORMAL LOW (ref 0.7–4.0)
MCH: 34.9 pg — ABNORMAL HIGH (ref 26.0–34.0)
MCHC: 32.2 g/dL (ref 30.0–36.0)
MCV: 108.4 fL — ABNORMAL HIGH (ref 80.0–100.0)
Monocytes Absolute: 0.6 10*3/uL (ref 0.1–1.0)
Monocytes Relative: 57 %
Neutro Abs: 0 10*3/uL — ABNORMAL LOW (ref 1.7–7.7)
Neutrophils Relative %: 4 %
Platelets: 139 10*3/uL — ABNORMAL LOW (ref 150–400)
RBC: 2.75 MIL/uL — ABNORMAL LOW (ref 4.22–5.81)
RDW: 15.4 % (ref 11.5–15.5)
WBC: 1.1 10*3/uL — CL (ref 4.0–10.5)
nRBC: 0 % (ref 0.0–0.2)

## 2019-07-05 LAB — COMPREHENSIVE METABOLIC PANEL
ALT: 19 U/L (ref 0–44)
AST: 12 U/L — ABNORMAL LOW (ref 15–41)
Albumin: 2.8 g/dL — ABNORMAL LOW (ref 3.5–5.0)
Alkaline Phosphatase: 54 U/L (ref 38–126)
Anion gap: 8 (ref 5–15)
BUN: 10 mg/dL (ref 8–23)
CO2: 23 mmol/L (ref 22–32)
Calcium: 8.8 mg/dL — ABNORMAL LOW (ref 8.9–10.3)
Chloride: 106 mmol/L (ref 98–111)
Creatinine, Ser: 0.54 mg/dL — ABNORMAL LOW (ref 0.61–1.24)
GFR calc Af Amer: >60 mL/min (ref >60)
GFR calc non Af Amer: >60 mL/min (ref >60)
Glucose, Bld: 105 mg/dL — ABNORMAL HIGH (ref 70–99)
Potassium: 3.7 mmol/L (ref 3.5–5.1)
Sodium: 137 mmol/L (ref 135–145)
Total Bilirubin: 0.5 mg/dL (ref 0.3–1.2)
Total Protein: 6.2 g/dL — ABNORMAL LOW (ref 6.5–8.1)

## 2019-07-05 LAB — MAGNESIUM: Magnesium: 1.6 mg/dL — ABNORMAL LOW (ref 1.7–2.4)

## 2019-07-05 MED ORDER — AMOXICILLIN-POT CLAVULANATE 875-125 MG PO TABS: 1.0000 | ORAL_TABLET | Freq: Two times a day (BID) | ORAL | 0 refills | Status: DC

## 2019-07-05 MED ORDER — HEPARIN SOD (PORK) LOCK FLUSH 100 UNIT/ML IV SOLN
500.0000 [IU] | INTRAVENOUS | Status: AC | PRN
  Administered 2019-07-05: 13:00:00 500 [IU]
  Filled 2019-07-05: qty 5

## 2019-07-05 MED ORDER — CEFDINIR 300 MG PO CAPS: 300.0000 mg | ORAL_CAPSULE | Freq: Two times a day (BID) | ORAL | 0 refills | Status: AC

## 2019-07-05 MED ORDER — METRONIDAZOLE 500 MG PO TABS: 500.0000 mg | ORAL_TABLET | Freq: Three times a day (TID) | ORAL | 0 refills | Status: AC

## 2019-07-05 NOTE — Telephone Encounter (Signed)
I spoke with his surgeons, agreed that we shouldn't place on life long abtx. I think we were going to do 3-4 wk and see if he declares himself.

## 2019-07-05 NOTE — Telephone Encounter (Signed)
Was contacted by wife [name]. Pt d/c'd from hospital Thursday - she was told by hospitalist that patient needed to come here net week for f/u and labs with Dr.Kale. She did not see need, unless [name] needs to see him. Pt is being seen next week at [facility] to discuss surgery. If appt w/[name] is needed - wife would appreciate call [phone]. [name] given information and question.

## 2019-07-05 NOTE — Progress Notes (Signed)
In to DC chart due to pt wife having questions regarding meds.

## 2019-07-05 NOTE — Plan of Care (Signed)
  Problem: Education: Goal: Knowledge of General Education information will improve Description Including pain rating scale, medication(s)/side effects and non-pharmacologic comfort measures Outcome: Adequate for Discharge   Problem: Health Behavior/Discharge Planning: Goal: Ability to manage health-related needs will improve Outcome: Adequate for Discharge   

## 2019-07-06 ENCOUNTER — Telehealth: Payer: Self-pay | Admitting: Hematology

## 2019-07-06 ENCOUNTER — Other Ambulatory Visit: Payer: Self-pay

## 2019-07-06 DIAGNOSIS — C8378 Burkitt lymphoma, lymph nodes of multiple sites: Secondary | ICD-10-CM

## 2019-07-06 NOTE — Telephone Encounter (Signed)
Scheduled apt per 10/23 sch message - pt is aware of appt date and time

## 2019-07-07 NOTE — Discharge Summary (Signed)
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 PCP: Follow-up on duration of antibiotics 2. Follow up LABS/TEST: Repeat CBC with hematology  Follow-up Information    [name], MD. Schedule an appointment as soon as possible for a visit in 1 week(s).   Specialty: Family Medicine Contact information: [place] [address] [phone]        [name], MD. Schedule an appointment as soon as possible for a visit in 1 week(s).   Specialties: Hematology, Oncology Why: repeat CBC Contact information: [place] [address] [phone]          Diet recommendation: Cardiac diet  Activity: The patient is advised to gradually reintroduce usual activities,as tolerated.  Discharge Condition: good  Code Status: Full code   History of present illness: As per the H and P dictated on admission, "Mark Frey is a 63 y.o. 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  Patient reports pain is usually worse around his left lower quadrant, denies any nausea/vomiting, changes in his  stool in his colostomy bag.  Patient denies any cough, congestion, chest pain, shortness of breath, dizziness.  Of note, patient has a history of recurrent diverticulitis (about 3 episodes this yr), most recently in [id number] which was complicated by E. coli bacteremia.  He has since remained on Augmentin.  At that admission, PET CT demonstrated wall thickening of the distal descending/proximal sigmoid colon consistent with likely diverticulitis and no evidence of recurrent lymphoma.  Patient is followed by general surgery Dr Victorio Palm at [facility] who has repeatedly recommended surgery (partial colectomy versus total colectomy), but patient has been refusing.  Patient follows with Dr. Ardis Hughs with [facility], and Dr. Graylon Good from [facility].  Today, patient is also refusing surgery, he wants to think about it."  Hospital Course:  Summary of his active problems in the hospital is as following.  Sepsis 2/2 recurrent colonic diverticulitis Currently afebrile, neutropenic Lactic acid unremarkable BC x2 so far no growth UA unremarkable, UC so far no growth Chest x-ray unremarkable CT abdomen/pelvis showedpersistent sigmoid colonic wall thickening, withslight improvement in inflammatory change from previous CT.  No abscess noted Treated with IV Zosyn, IV fluids Patient was on oral Augmentin prior to arrival. Family is not comfortable with going back on Augmentin and therefore will offer to be on oral Omnicef and Flagyl for a few more days to complete the treatment course for total of 7 days. Informed family that no further antibiotics are needed for now. Family has plan to follow-up with surgery outpatient.  Pancytopenia Chronic, unknown etiology No longer on any chemotherapy/radiation Neutropenic precautions Repeat CBC with hematology recommended.  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  History of PE Continue Eliquis  Patient was ambulatory without any assistance. On the day of the discharge the patient's vitals were stable, and no other acute medical condition were reported by patient. the patient was felt safe to be discharge at Home with no therapy needed on discharge.  Consultants: none Procedures: none  DISCHARGE MEDICATION: Allergies as of 07/05/2019      Reactions   Ciprofloxacin Other (See Comments)   Leg tingling      Medication List    STOP taking these medications   amoxicillin-clavulanate 875-125 MG tablet Commonly known as: Augmentin     TAKE these medications   acetaminophen 325 MG tablet Commonly known as: TYLENOL Take 2 tablets (650 mg total) by mouth every 6 (six) hours as needed for mild pain (or Fever >/= 101).   apixaban 5 MG Tabs tablet Commonly known as: Eliquis DO NOT TAKE UNTIL SEEN BY YOUR ONCOLOGIST What changed:   how much to take  how to take this  when to take this   blood glucose meter kit and supplies Dispense based on patient and insurance preference. Use daily as directed. (E11.9).   cefdinir 300 MG capsule Commonly known as: OMNICEF Take 1 capsule (300 mg total) by mouth 2 (two) times daily for 5 days.   citalopram 20 MG tablet Commonly known as: CELEXA Take 1 tablet (20 mg total) by mouth daily.   dexamethasone 1 MG tablet Commonly known as: DECADRON Take 1 tablet (1 mg total) by mouth every morning. What changed:   when to take this  additional instructions   dronabinol 5 MG capsule Commonly known as: MARINOL Take 1 capsule (5 mg total) by mouth 2 (two) times daily before lunch and supper. What changed: when to take this   glucose blood test strip Commonly known as: FREESTYLE TEST STRIPS Use to check blood sugar daily     HYDROcodone-acetaminophen 10-325 MG tablet Commonly known as: NORCO Take 1 tablet by mouth every 6 (six) hours as needed for moderate pain or severe pain.   magnesium oxide 400 (241.3 Mg) MG tablet Commonly known as: MAG-OX Take 1 tablet (400 mg total) by mouth 2 (two) times daily.   metroNIDAZOLE 500 MG tablet Commonly known as: Flagyl Take 1 tablet (500 mg total) by mouth 3 (three) times daily for 5 days.   VITAMIN B COMPLEX PO Take by mouth.   Zinc Sulfate 220 (50 Zn) MG Tabs Take 1 tablet by mouth 2 (two) times daily.      Allergies  Allergen Reactions   Ciprofloxacin Other (See Comments)    Leg tingling   Discharge Instructions    Diet - low sodium heart healthy   Complete by: As directed    Increase activity slowly   Complete by: As directed      Discharge Exam: Filed Weights   07/02/19 2010  Weight: 88 kg   Vitals:   07/04/19 2130 07/05/19 0449  BP: 113/75 103/71  Pulse: 83 (!) 52  Resp: 16 16  Temp: 99.4 F (37.4 C) 98.2 F (36.8 C)  SpO2: 97% 99%   General: Appear in no distress, no Rash; Oral Mucosa Clear, moist. no Abnormal Mass Or lumps Cardiovascular: S1 and S2 Present, no Murmur, Respiratory: normal respiratory effort,  Bilateral Air entry present and Clear to Auscultation, no Crackles, no wheezes Abdomen: Bowel Sound present, Soft and no tenderness, no hernia Extremities: no Pedal edema, no calf tenderness Neurology: alert and oriented to time, place, and person affect appropriate.  The results of significant diagnostics from this hospitalization (including imaging, microbiology, ancillary and laboratory) are listed below for reference.    Significant Diagnostic Studies: Ct Abdomen Pelvis W Contrast  Result Date: 07/03/2019 CLINICAL DATA:  Abdominal pain and fever. History of abscess. Burkitt's lymphoma with colostomy. EXAM: CT ABDOMEN AND PELVIS WITH CONTRAST TECHNIQUE: Multidetector CT imaging of the abdomen and pelvis was performed  using the standard protocol following bolus administration of intravenous contrast. CONTRAST:  175m OMNIPAQUE IOHEXOL 300 MG/ML  SOLN COMPARISON:  CT 05/24/2019 FINDINGS: Lower chest: Subsegmental atelectasis or scarring in both lower lobes. No pleural fluid. Hepatobiliary: Tiny hypodense lesion in the right dome of the liver prior exam is not as well visualized currently. There is a tiny 4 mm hypodensity in the inferior liver that is unchanged. No calcified gallstone or pericholecystic inflammation. No biliary dilatation. Pancreas: No ductal dilatation or inflammation. Spleen: Normal in size without focal abnormality. Adrenals/Urinary Tract: No adrenal nodule. Nonobstructing stone in the right mid kidney. No hydronephrosis. Mild symmetric bilateral perinephric edema is unchanged. Small subcentimeter low-density lesions in both kidneys are too small to accurately characterize but likely small cysts. Urinary bladder is partially distended. There is wall thickening about the left aspect of the bladder dome, bladder dome is tented into the left upper pelvis. Appearance is unchanged from previous. Stomach/Bowel: Bowel evaluation is limited in the absence of enteric contrast. Stomach is nondistended. No evidence of small bowel obstruction. Matted loops of small bowel in continuity with the descending colon in the left lower quadrant, series 2, image 52, unchanged from comparison exams. Right lower quadrant loop ileostomy. Persistent sigmoid colonic wall thickening with improvement in pericolonic edema from prior exam. Persistent inflammatory changes and stranding in the right lower quadrant. Persistent tethering of the sigmoid and ascending colon, series 2, image 61, not significantly changed from prior. No new colonic inflammation. Vascular/Lymphatic: Mild aortic atherosclerosis. No aneurysm. Patent portal vein. No enlarged lymph nodes in the abdomen or pelvis. Reproductive: Prostate is unremarkable. Other: Persistent  right lower quadrant soft tissue stranding. No evidence of abscess. No ascites or free air. Musculoskeletal: There are no acute or suspicious osseous abnormalities. IMPRESSION: 1. Persistent sigmoid colonic wall thickening, with slight improvement in inflammatory change from CT last month. There is otherwise no significant interval change from prior exams with potential fistulous communication versus tethering of the sigmoid colon to the adjacent ascending colon as well as tethering versus fistula with loops of small bowel in the left lower quadrant. 2. No intra-abdominal abscess.  No other new abnormality. 3. Nonobstructing right renal stone. Electronically Signed   By: MKeith RakeM.D.   On: 07/03/2019 00:54   Dg Chest Port 1 View  Result Date: 07/02/2019 CLINICAL DATA:  Neutropenic fever EXAM: PORTABLE CHEST 1 VIEW COMPARISON:  08/24/2018, 05/04/2019 FINDINGS: Right-sided central venous port tip over the cavoatrial region. Low lung volumes. Atelectasis or scarring in the bilateral mid to lower lungs. Stable cardiomediastinal silhouette. No pneumothorax. IMPRESSION: No active disease. Low lung volumes with scarring and or atelectasis in the mid to lower lung zones. Electronically Signed   By: KDonavan FoilM.D.   On: 07/02/2019 23:18   Microbiology: Recent Results (from the past 240 hour(s))  Urine culture  Status: None   Collection Time: 07/02/19 10:44 PM   Specimen: In/Out Cath Urine  Result Value Ref Range Status   Specimen Description   Final    IN/OUT CATH URINE Performed at [facility], [place] [address], [place], [place] [address]    Special Requests   Final    NONE Performed at [facility], [place] [address], [place], [place] [address]    Culture   Final    NO GROWTH Performed at [facility], [place] [address], [place], [place] [address]    Report Status 07/03/2019 FINAL  Final  Blood Culture (routine x 2)     Status: None (Preliminary  result)   Collection Time: 07/02/19 11:22 PM   Specimen: BLOOD RIGHT HAND  Result Value Ref Range Status   Specimen Description   Final    BLOOD RIGHT HAND Performed at [place] [address] [place], [place] [address]    Special Requests   Final    BOTTLES DRAWN AEROBIC AND ANAEROBIC Blood Culture adequate volume Performed at [place] [address], [place], [place] [address]    Culture   Final    NO GROWTH 4 DAYS Performed at [facility], [place] [address], [place], [place] [address]    Report Status PENDING  Incomplete  Blood Culture (routine x 2)     Status: None (Preliminary result)   Collection Time: 07/02/19 11:22 PM   Specimen: BLOOD  Result Value Ref Range Status   Specimen Description   Final    BLOOD PORTA CATH Performed at [facility] [address] [place], [place] [address]    Special Requests   Final    BOTTLES DRAWN AEROBIC AND ANAEROBIC Blood Culture adequate volume Performed at [place] [address], [place], [place] [address]    Culture   Final    NO GROWTH 4 DAYS Performed at [facility], [place] [address], [place], [place] [address]    Report Status PENDING  Incomplete  SARS CORONAVIRUS 2 (TAT 6-24 HRS) Nasopharyngeal Nasopharyngeal Swab     Status: None   Collection Time: 07/02/19 11:22 PM   Specimen: Nasopharyngeal Swab  Result Value Ref Range Status   SARS Coronavirus 2 NEGATIVE NEGATIVE Final    Comment: (NOTE) SARS-CoV-2 target nucleic acids are NOT DETECTED. The SARS-CoV-2 RNA is generally detectable in upper and lower respiratory specimens during the acute phase of infection. Negative results do not preclude SARS-CoV-2 infection, do not rule out co-infections with other pathogens, and should not be used as the sole basis for treatment or other patient management decisions. Negative results must be combined with clinical  observations, patient history, and epidemiological information. The expected result is Negative. Fact Sheet for Patients: SugarRoll.be Fact Sheet for Healthcare Providers: https://www.woods-mathews.com/ This test is not yet approved or cleared by the [place] FDA and  has been authorized for detection and/or diagnosis of SARS-CoV-2 by FDA under an Emergency Use Authorization (EUA). This EUA will remain  in effect (meaning this test can be used) for the duration of the COVID-19 declaration under Section 56 4(b)(1) of the Act, 21 U.S.C. section 360bbb-3(b)(1), unless the authorization is terminated or revoked sooner. Performed at [facility], [place] [address] [place], [place] [address]    Labs: CBC: Recent Labs  Lab 07/02/19 2102 07/03/19 0609 07/04/19 0335 07/05/19 0351  WBC 0.8* 1.2* 1.0* 1.1*  NEUTROABS  --  0.0*  --  0.0*  HGB 11.7* 9.9* 9.6* 9.6*  HCT 35.1* 30.4* 29.4* 29.8*  MCV 106.4* 107.8* 108.1* 108.4*  PLT 139* 123* 125* 979*   Basic Metabolic Panel: Recent Labs  Lab 07/02/19 2102 07/04/19 0335 07/05/19 0351  NA 133* 135 137  K 3.3* 3.1* 3.7  CL 100 105 106  CO2 20* 22 23  GLUCOSE 156* 128* 105*  BUN _0 CREATININE 0.64 0.49* 0.54*  CALCIUM 9.2 8.5* 8.8*  MG  --   --  1.6*   Liver Function Tests: Recent Labs  Lab 07/02/19 2102 07/05/19 0351  AST 17 12*  ALT 23 19  ALKPHOS 59 54  BILITOT 1.1 0.5  PROT 7.1 6.2*  ALBUMIN 3.6 2.8*   No results for input(s): LIPASE, AMYLASE in the last 168 hours. No results for input(s): AMMONIA in the last 168 hours. Cardiac Enzymes: No results for input(s): CKTOTAL, CKMB, CKMBINDEX, TROPONINI in the last 168 hours. BNP (last 3 results) No results for input(s): BNP in the last 8760 hours. CBG: No results for input(s): GLUCAP in the last 168 hours.  Time spent: 35 minutes  Signed:  Berle Mull  [facility] 07/05/2019 6:32 PM

## 2019-07-08 LAB — CULTURE, BLOOD (ROUTINE X 2)
Culture: NO GROWTH
Culture: NO GROWTH
Special Requests: ADEQUATE
Special Requests: ADEQUATE

## 2019-07-10 DIAGNOSIS — C162 Malignant neoplasm of body of stomach: Secondary | ICD-10-CM | POA: Diagnosis not present

## 2019-07-10 DIAGNOSIS — R269 Unspecified abnormalities of gait and mobility: Secondary | ICD-10-CM | POA: Diagnosis not present

## 2019-07-11 DIAGNOSIS — Z6829 Body mass index (BMI) 29.0-29.9, adult: Secondary | ICD-10-CM | POA: Diagnosis not present

## 2019-07-11 DIAGNOSIS — Z01812 Encounter for preprocedural laboratory examination: Secondary | ICD-10-CM | POA: Diagnosis not present

## 2019-07-11 DIAGNOSIS — K5732 Diverticulitis of large intestine without perforation or abscess without bleeding: Secondary | ICD-10-CM | POA: Diagnosis not present

## 2019-07-11 DIAGNOSIS — Z01810 Encounter for preprocedural cardiovascular examination: Secondary | ICD-10-CM | POA: Diagnosis not present

## 2019-07-11 DIAGNOSIS — K5720 Diverticulitis of large intestine with perforation and abscess without bleeding: Secondary | ICD-10-CM | POA: Diagnosis not present

## 2019-07-12 ENCOUNTER — Inpatient Hospital Stay: Payer: BC Managed Care – PPO | Attending: Radiation Oncology | Admitting: Hematology

## 2019-07-12 ENCOUNTER — Inpatient Hospital Stay: Payer: BC Managed Care – PPO

## 2019-07-12 ENCOUNTER — Other Ambulatory Visit: Payer: Self-pay

## 2019-07-12 VITALS — BP 126/74 | HR 101 | Temp 98.9°F | Resp 18 | Ht 69.0 in | Wt 200.8 lb

## 2019-07-12 DIAGNOSIS — D703 Neutropenia due to infection: Secondary | ICD-10-CM | POA: Diagnosis not present

## 2019-07-12 DIAGNOSIS — C8378 Burkitt lymphoma, lymph nodes of multiple sites: Secondary | ICD-10-CM

## 2019-07-12 LAB — CBC WITH DIFFERENTIAL (CANCER CENTER ONLY)
Abs Immature Granulocytes: 0.07 10*3/uL (ref 0.00–0.07)
Basophils Absolute: 0 10*3/uL (ref 0.0–0.1)
Basophils Relative: 1 %
Eosinophils Absolute: 0 10*3/uL (ref 0.0–0.5)
Eosinophils Relative: 1 %
HCT: 37.7 % — ABNORMAL LOW (ref 39.0–52.0)
Hemoglobin: 12.2 g/dL — ABNORMAL LOW (ref 13.0–17.0)
Immature Granulocytes: 1 %
Lymphocytes Relative: 16 %
Lymphs Abs: 1.1 10*3/uL (ref 0.7–4.0)
MCH: 34.8 pg — ABNORMAL HIGH (ref 26.0–34.0)
MCHC: 32.4 g/dL (ref 30.0–36.0)
MCV: 107.4 fL — ABNORMAL HIGH (ref 80.0–100.0)
Monocytes Absolute: 0.6 10*3/uL (ref 0.1–1.0)
Monocytes Relative: 9 %
Neutro Abs: 4.8 10*3/uL (ref 1.7–7.7)
Neutrophils Relative %: 72 %
Platelet Count: 227 10*3/uL (ref 150–400)
RBC: 3.51 MIL/uL — ABNORMAL LOW (ref 4.22–5.81)
RDW: 15.4 % (ref 11.5–15.5)
WBC Count: 6.5 10*3/uL (ref 4.0–10.5)
nRBC: 0 % (ref 0.0–0.2)

## 2019-07-12 LAB — CMP (CANCER CENTER ONLY)
ALT: 21 U/L (ref 0–44)
AST: 19 U/L (ref 15–41)
Albumin: 3.3 g/dL — ABNORMAL LOW (ref 3.5–5.0)
Alkaline Phosphatase: 83 U/L (ref 38–126)
Anion gap: 12 (ref 5–15)
BUN: 12 mg/dL (ref 8–23)
CO2: 25 mmol/L (ref 22–32)
Calcium: 9.4 mg/dL (ref 8.9–10.3)
Chloride: 103 mmol/L (ref 98–111)
Creatinine: 0.9 mg/dL (ref 0.61–1.24)
GFR, Est AFR Am: >60 mL/min (ref >60)
GFR, Estimated: >60 mL/min (ref >60)
Glucose, Bld: 187 mg/dL — ABNORMAL HIGH (ref 70–99)
Potassium: 4 mmol/L (ref 3.5–5.1)
Sodium: 140 mmol/L (ref 135–145)
Total Bilirubin: <0.2 mg/dL — ABNORMAL LOW (ref 0.3–1.2)
Total Protein: 6.6 g/dL (ref 6.5–8.1)

## 2019-07-12 LAB — RETICULOCYTES
Immature Retic Fract: 17.9 % — ABNORMAL HIGH (ref 2.3–15.9)
RBC.: 3.47 MIL/uL — ABNORMAL LOW (ref 4.22–5.81)
Retic Count, Absolute: 75.3 10*3/uL (ref 19.0–186.0)
Retic Ct Pct: 2.2 % (ref 0.4–3.1)

## 2019-07-12 NOTE — Progress Notes (Signed)
HEMATOLOGY/ONCOLOGY CLINIC NOTE  Date of Service: 07/12/19    Patient Care Team: [name], MD as PCP - General (Family Medicine)  CHIEF COMPLAINTS/PURPOSE OF CONSULTATION:  F/u for continued Mx of Burkitts lymphoma  HISTORY OF PRESENTING ILLNESS:   Mark Frey is a wonderful 63 y.o. male who has been referred to [place] by my colleague Dr. 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 He is accompanied today by his wife. The pt reports that he is doing well overall.   The pt started R-EPOCH every 3 weeks with neulasta support on 03/17/18. He presented to the ED with right leg swelling, found to be a DVT and bilateral PE, which resulted in the incidental finding of a lower abdomen tumor measuring 18.7 x 18.5 x 17.8 cm. He denies any abdominal pains or changes in his bowel habits prior to this finding. He first noted blood in the stools 4-5 days prior to appearing to the ED.  The pt reports that he has been taking 169m Lovenox injections once each day, except for the days in which he had blood in the stools. He hasn't had blood in his stools for the last 7 days.   He has had GI bleed related to bowel involvement with his lymphoma - this is now resolving and his hgb is stabilizing.  He notes that his scrotum and leg swelling has decreased. He denies having CP or SOB at any point from his PE.   Most recent lab results (04/06/18) of CBC w/diff, CMP, Reticulocytes  is as follows: all values are WNL except for WBC at 15.3k, RBC at 3.65, HGB at 10.5, HCT at 31.4, RDW at 17.8, PLT at 640k, ANC at 12.7k, Monocytes abs at 1.6k, Glucose at 162, Total Protein at 6.2, Albumin at 3.1, Total bilirubin at <0.2, Retic ct pct at 2.5%. Uric acid 04/06/18 was low at 3.0 LDH 04/06/18 elevated at 251  On review of systems, pt reports remaining right leg swelling, blood in the stools 7 days ago, two  bowel movements each day, left leg swelling, improved scrotal swelling, eating well, and denies problems with his port, abdominal pains, constipation, diarrhea, jaw pain, pain along the spine, problems passing urine, and any other symptoms.  Interval History:   Mark MCCARNEYwas seen in f/u of his Burkitt's lymphoma and for post hospitalization f/u for severe neutropenia.The [place]. The pt reports that he is doing well overall and is accompanied by his wife [name] the phone.    The pt reports that he feels good and has no pain. Colostomy is working well. There has been no blood in his stools.   [facility] will be having the surgery on November 16th 2020. They were advised that it is a possibility for total colectomy and was prescribed more antibiotics to take until surgery.  Of note since the patient's last visit, pt has had PORTABLE CHEST 1 VIEW (Accession [id number])VBTYOMAYOon 07/02/2019 with results revealing "No active disease. Low lung volumes with scarring and or atelectasis in the mid to lower lung zones."  Of note since the patient's last visit, pt has had CT ABDOMEN AND PELVIS WITH CONTRAST (Accession [id number] completed on 07/03/2019 with results revealing "1. Persistent sigmoid colonic wall thickening, with slight improvement in inflammatory change from CT last  month. There is otherwise no significant interval change from prior exams with potential fistulous communication versus tethering of the sigmoid colon to the adjacent ascending colon as well as tethering versus fistula with loops of small bowel in the left lower quadrant. 2. No intra-abdominal abscess.  No other new abnormality. 3. Nonobstructing right renal stone."  Lab results today (07/12/19) of CBC w/diff and CMP is as follows: all values are WNL except for RBC at and3.47, Immature Retic Fract at 17.9, RBC at 3.51, Hemoglobin at 12.2,  HCT at 37.7, MCV at 107.4, MCH at 34.8, Glucose Bld at 187, Albumin at 3.3, Total Bilirubin at <0.2. His severe neutropenia likely from sepsis of abdominal source wiyth ANC of 0 about 2 weeks ago. ANC today has normalized to 4.8k  On review of systems, pt reports that he is doing well and denies blood in stools, leg swelling and any other symptoms.    MEDICAL HISTORY:  Past Medical History:  Diagnosis Date   ALLERGIC RHINITIS    Cancer ([place])    Lymphoma    Diabetes mellitus    Hyperlipidemia     SURGICAL HISTORY: Past Surgical History:  Procedure Laterality Date   BIOPSY  03/15/2018   Procedure: BIOPSY;  Surgeon: Milus Banister, MD;  Location: [facility];  Service: Endoscopy;;   BIOPSY  03/01/2019   Procedure: BIOPSY;  Surgeon: Ladene Artist, MD;  Location: [facility];  Service: Endoscopy;;   BOWEL RESECTION N/A 08/25/2018   Procedure: LAPROSCOPIC LOOP ILEOSTOMY;  Surgeon: Greer Pickerel, MD;  Location: [name];  Service: General;  Laterality: N/A;   CLEFT PALATE REPAIR     COLONOSCOPY N/A 03/15/2018   Procedure: COLONOSCOPY;  Surgeon: Milus Banister, MD;  Location: [name];  Service: Endoscopy;  Laterality: N/A;   COLONOSCOPY N/A 08/20/2018   Procedure: COLONOSCOPY;  Surgeon: Irene Shipper, MD;  Location: [facility];  Service: Endoscopy;  Laterality: N/A;   deviated septum repair     slight improvement   ESOPHAGOGASTRODUODENOSCOPY N/A 03/15/2018   Procedure: ESOPHAGOGASTRODUODENOSCOPY (EGD);  Surgeon: Milus Banister, MD;  Location: [name];  Service: Endoscopy;  Laterality: N/A;   FLEXIBLE SIGMOIDOSCOPY N/A 03/01/2019   Procedure: FLEXIBLE SIGMOIDOSCOPY;  Surgeon: Ladene Artist, MD;  Location: [facility];  Service: Endoscopy;  Laterality: N/A;   ILEOSCOPY N/A 03/01/2019   Procedure: ILEOSCOPY THROUGH STOMA;  Surgeon: Ladene Artist, MD;  Location: [facility];  Service: Endoscopy;  Laterality: N/A;   IR IMAGING GUIDED PORT INSERTION  03/17/2018     LAPAROSCOPY N/A 08/25/2018   Procedure: LAPAROSCOPY DIAGNOSTIC;  Surgeon: Greer Pickerel, MD;  Location: [facility];  Service: General;  Laterality: N/A;   TONSILLECTOMY      SOCIAL HISTORY: Social History   Socioeconomic History   Marital status: Married    Spouse name: lisa   Number of children: 0   Years of education: Not on file   Highest education level: Not on file  Occupational History   Not on file  Social Needs   Financial resource strain: Not hard at all   Food insecurity    Worry: Never true    Inability: Never true   Transportation needs    Medical: No    Non-medical: No  Tobacco Use   Smoking status: Never Smoker   Smokeless tobacco: Never Used  Substance and Sexual Activity   Alcohol use: Yes    Comment: occasional   Drug use: No   Sexual activity: Yes  Lifestyle  Physical activity    Days per week: 0 days    Minutes per session: 0 min   Stress: Not at all  Relationships   Social connections    Talks on phone: More than three times a week    Gets together: More than three times a week    Attends religious service: 1 to 4 times per year    Active member of club or organization: No    Attends meetings of clubs or organizations: Never    Relationship status: Married   Intimate partner violence    Fear of current or ex partner: No    Emotionally abused: No    Physically abused: No    Forced sexual activity: No  Other Topics Concern   Not on file  Social History Narrative   Married 1985. No kids. 4 small dogs.       Works in Financial trader, residential      Hobbies: work on cars, Haematologist, exercise as able    FAMILY HISTORY: Family History  Problem Relation Age of Onset   Lung cancer Mother        smoker   Brain cancer Mother        metastasis   AAA (abdominal aortic aneurysm) Father        smoker    ALLERGIES:  is allergic to ciprofloxacin.  MEDICATIONS:  Current Outpatient Medications  Medication Sig  Dispense Refill   acetaminophen (TYLENOL) 325 MG tablet Take 2 tablets (650 mg total) by mouth every 6 (six) hours as needed for mild pain (or Fever >/= 101). 30 tablet 0   apixaban (ELIQUIS) 5 MG TABS tablet DO NOT TAKE UNTIL SEEN BY YOUR ONCOLOGIST (Patient taking differently: Take 5 mg by mouth 2 (two) times daily. DO NOT TAKE UNTIL SEEN BY YOUR ONCOLOGIST) 60 tablet 3   B Complex Vitamins (VITAMIN B COMPLEX PO) Take by mouth.     blood glucose meter kit and supplies Dispense based on patient and insurance preference. Use daily as directed. (E11.9). 1 each 0   citalopram (CELEXA) 20 MG tablet Take 1 tablet (20 mg total) by mouth daily. 90 tablet 3   dexamethasone (DECADRON) 1 MG tablet Take 1 tablet (1 mg total) by mouth every morning. (Patient taking differently: Take 1 mg by mouth. Mon, Wed, Friday) 30 tablet 2   dronabinol (MARINOL) 5 MG capsule Take 1 capsule (5 mg total) by mouth 2 (two) times daily before lunch and supper. (Patient taking differently: Take 5 mg by mouth daily. ) 60 capsule 0   glucose blood (FREESTYLE TEST STRIPS) test strip Use to check blood sugar daily 100 each 4   HYDROcodone-acetaminophen (NORCO) 10-325 MG tablet Take 1 tablet by mouth every 6 (six) hours as needed for moderate pain or severe pain. 30 tablet 0   magnesium oxide (MAG-OX) 400 (241.3 Mg) MG tablet Take 1 tablet (400 mg total) by mouth 2 (two) times daily. 60 tablet 0   Zinc Sulfate 220 (50 Zn) MG TABS Take 1 tablet by mouth 2 (two) times daily.     No current facility-administered medications for this visit.     REVIEW OF SYSTEMS:    A 10+ POINT REVIEW OF SYSTEMS WAS OBTAINED including neurology, dermatology, psychiatry, cardiac, respiratory, lymph, extremities, GI, GU, Musculoskeletal, constitutional, breasts, reproductive, HEENT.  All pertinent positives are noted in the HPI.  All others are negative.    PHYSICAL EXAMINATION:  Performed in wheelchair  ECOG FS:2 - Symptomatic, <50%  confined to bed  There were no vitals filed for this visit. Wt Readings from Last 3 Encounters:  07/02/19 194 lb (88 kg)  05/31/19 194 lb 3.2 oz (88.1 kg)  05/25/19 189 lb 6 oz (85.9 kg)   There is no height or weight on file to calculate BMI.    GENERAL:alert, in no acute distress and comfortable SKIN: no acute rashes, no significant lesions EYES: conjunctiva are pink and non-injected, sclera anicteric OROPHARYNX: MMM, no exudates, no oropharyngeal erythema or ulceration NECK: supple, no JVD LYMPH:  no palpable lymphadenopathy in the cervical, axillary or inguinal regions LUNGS: clear to auscultation b/l with normal respiratory effort HEART: regular rate & rhythm ABDOMEN:  normoactive bowel sounds , non tender, not distended. Extremity: no pedal edema PSYCH: alert & oriented x 3 with fluent speech NEURO: no focal motor/sensory deficits   LABORATORY DATA:   CBC Latest Ref Rng & Units 07/12/2019 07/05/2019 07/04/2019  WBC 4.0 - 10.5 K/uL 6.5 1.1(LL) 1.0(LL)  Hemoglobin 13.0 - 17.0 g/dL 12.2(L) 9.6(L) 9.6(L)  Hematocrit 39.0 - 52.0 % 37.7(L) 29.8(L) 29.4(L)  Platelets 150 - 400 K/uL 227 139(L) 125(L)   ANC 0--->4.8k  CMP Latest Ref Rng & Units 07/12/2019 07/05/2019 07/04/2019  Glucose 70 - 99 mg/dL 187(H) 105(H) 128(H)  BUN 8 - 23 mg/dL _0 Creatinine 0.61 - 1.24 mg/dL 0.90 0.54(L) 0.49(L)  Sodium 135 - 145 mmol/L 140 137 135  Potassium 3.5 - 5.1 mmol/L 4.0 3.7 3.1(L)  Chloride 98 - 111 mmol/L 103 106 105  CO2 22 - 32 mmol/L _1 Calcium 8.9 - 10.3 mg/dL 9.4 8.8(L) 8.5(L)  Total Protein 6.5 - 8.1 g/dL 6.6 6.2(L) -  Total Bilirubin 0.3 - 1.2 mg/dL <0.2(L) 0.5 -  Alkaline Phos 38 - 126 U/L 83 54 -  AST 15 - 41 U/L 19 12(L) -  ALT 0 - 44 U/L 21 19 -   Lab Results  Component Value Date   LDH 116 05/31/2019    03/01/19 Colonoscopy biopsies:     03/17/18 Cytogenetics:   03/17/18 Colon Bx:   03/13/18 Peritoneum Biopsy:     07/02/2019 PORTABLE CHEST 1  VIEW (Accession [id number])    07/02/2019 CT ABDOMEN AND PELVIS WITH CONTRAST (Accession [id number])  RADIOGRAPHIC STUDIES:  I have personally reviewed the radiological images as listed and agreed with the findings in the report. Ct Abdomen Pelvis W Contrast  Result Date: 07/03/2019 CLINICAL DATA:  Abdominal pain and fever. History of abscess. Burkitt's lymphoma with colostomy. EXAM: CT ABDOMEN AND PELVIS WITH CONTRAST TECHNIQUE: Multidetector CT imaging of the abdomen and pelvis was performed using the standard protocol following bolus administration of intravenous contrast. CONTRAST:  183m OMNIPAQUE IOHEXOL 300 MG/ML  SOLN COMPARISON:  CT 05/24/2019 FINDINGS: Lower chest: Subsegmental atelectasis or scarring in both lower lobes. No pleural fluid. Hepatobiliary: Tiny hypodense lesion in the right dome of the liver prior exam is not as well visualized currently. There is a tiny 4 mm hypodensity in the inferior liver that is unchanged. No calcified gallstone or pericholecystic inflammation. No biliary dilatation. Pancreas: No ductal dilatation or inflammation. Spleen: Normal in size without focal abnormality. Adrenals/Urinary Tract: No adrenal nodule. Nonobstructing stone in the right mid kidney. No hydronephrosis. Mild symmetric bilateral perinephric edema is unchanged. Small subcentimeter low-density lesions in both kidneys are too small to accurately characterize but likely small cysts. Urinary bladder is partially distended. There is wall thickening about the left aspect of the bladder dome, bladder dome  is tented into the left upper pelvis. Appearance is unchanged from previous. Stomach/Bowel: Bowel evaluation is limited in the absence of enteric contrast. Stomach is nondistended. No evidence of small bowel obstruction. Matted loops of small bowel in continuity with the descending colon in the left lower quadrant, series 2, image 52, unchanged from comparison exams. Right lower quadrant loop  ileostomy. Persistent sigmoid colonic wall thickening with improvement in pericolonic edema from prior exam. Persistent inflammatory changes and stranding in the right lower quadrant. Persistent tethering of the sigmoid and ascending colon, series 2, image 61, not significantly changed from prior. No new colonic inflammation. Vascular/Lymphatic: Mild aortic atherosclerosis. No aneurysm. Patent portal vein. No enlarged lymph nodes in the abdomen or pelvis. Reproductive: Prostate is unremarkable. Other: Persistent right lower quadrant soft tissue stranding. No evidence of abscess. No ascites or free air. Musculoskeletal: There are no acute or suspicious osseous abnormalities. IMPRESSION: 1. Persistent sigmoid colonic wall thickening, with slight improvement in inflammatory change from CT last month. There is otherwise no significant interval change from prior exams with potential fistulous communication versus tethering of the sigmoid colon to the adjacent ascending colon as well as tethering versus fistula with loops of small bowel in the left lower quadrant. 2. No intra-abdominal abscess.  No other new abnormality. 3. Nonobstructing right renal stone. Electronically Signed   By: Keith Rake M.D.   On: 07/03/2019 00:54   Dg Chest Port 1 View  Result Date: 07/02/2019 CLINICAL DATA:  Neutropenic fever EXAM: PORTABLE CHEST 1 VIEW COMPARISON:  08/24/2018, 05/04/2019 FINDINGS: Right-sided central venous port tip over the cavoatrial region. Low lung volumes. Atelectasis or scarring in the bilateral mid to lower lungs. Stable cardiomediastinal silhouette. No pneumothorax. IMPRESSION: No active disease. Low lung volumes with scarring and or atelectasis in the mid to lower lung zones. Electronically Signed   By: Donavan Foil M.D.   On: 07/02/2019 23:18    ASSESSMENT & PLAN:   63 y.o. male with  1. High grade B-cell lymphoma (Chromonsomal variant Burkitts lymphoma) stage IIE bulkydisease - currently in  remission. 03/10/18 CT A/P revealed Large irregular infiltrative solid mass in the right lower quadrant measuring up to 18.7 x 18.5 x 17.8 cm, infiltrating and encasing multiple distal small bowel loops and likely the ileocecal region, partially encasing the sigmoid colon, with prominent extension into the right lower retroperitoneum and extraperitoneal right pelvis with encasement of right external iliac and proximal right common iliac vasculature and infiltration of the right iliopsoas muscle. No BM or CNS involvement.   04/18/18 PET/CT revealed Continued improvement in right colonic wall thickening and surrounding bulky masses consistent with treated lymphoma. No hypermetabolic activity to suggest residual tumor. There is some hypermetabolic activity associated with the sigmoid colon which demonstrates mild wall thickening, surrounding inflammation and underlying diverticulosis, suggesting mild diverticulitis. Suspected treatment related changes throughout the bone marrow. There is mildly increased activity within the clivus without clear corresponding finding on the CT images. Attention on follow-up recommended. Nonobstructing right renal calculi. 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   06/09/18 PET/CT revealedSoft tissue lesion within the ventral pelvis is again identified demonstrating mild to moderate increased uptake within SUV max of 5.61. Deauville criteria 4. Concerning for residual metabolically active tumor. 2. Similar appearance of diffusely thickened appendix within the right lower quadrant of the abdomen. Findings may reflect treatment related changes. 3. Increased radiotracer uptake throughout the bone marrow which is favored to represent treatment related changes. 4. Lad coronary artery atherosclerotic calcifications.  patient completed planned chemorx on 10/21  10/05/18 CT A/P revealed Persistent wall thickening in the sigmoid colon although a component of this could be due to  diverticulosis. There is also a suggestion of wall thickening and potential mucosal enhancement in the terminal ileum, as well as adjacent indistinct stranding in the mesenteric and omental adipose tissues roughly similar to the prior exam. Some of this may be treatment related. Inflammatory process such as Crohn's disease not excluded. 2. No current adenopathy. 3. Other imaging findings of potential clinical significance: Nonobstructive right nephrolithiasis. Aortic Atherosclerosis. Moderate prostatomegaly. Left foraminal impingement at L4-5.  S/p IMRT with 36 Gy over 20 fractions, between 11/09/18 and 12/06/18 with Dr. Isidore Moos  05/24/2019 CT CAP revealed "1. Overall little interval change from CT 05/09/2019. Persistent inflamed loop of distal sigmoid colon with submucosal edema. Potential fistulous communication versus tethering to the ascending colon and a loop of small bowel. Findings are suggestive of inflammatory bowel disease. 2. No evidence of bowel obstruction. Oral contrast enters the ileostomy bag with bowel obstruction or small bowel dilatation."  2.H/oRLE DVT and b/l PEon anticoagulation.   3. Small bowel obstruction/ileus with sigmoid thickening/stenosis .  CT scan - sigmoid obstructive lesion ? Scar tissue from resolved lymphoma vs residual lymphoma vs post inflammatory scarring from diverticulitis/limited perforation. Also has SBO from adhesions. Colonoscopy 08/21/2018 - no intramucosal lesions. Extrinsic compression of sigmoid colon. 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 Greer Pickerel  4.  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  GI panel and C. difficile negative.  CMV IgM negative, EBV IgM negative. 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  5. Resolved Dehydration with hypovolemic hyponatremia.  Other electrolyte issues including hypokalemia and hypomagnesemia -stabilizing.  6. S/p   Pancytopenia  7. Possible Crohn's  Follow up GI  PLAN: -Discussed pt labwork today, 07/12/19; CBC w/diff and CMP is as follows: all values are WNL except for RBC at 3.47, Immature Retic Fract at 17.9, RBC at 3.51, Hemoglobin at 12.2, HCT at 37.7, MCV at 107.4, MCH at 34.8, Glucose Bld at 187, Albumin at 3.3, Total Bilirubin at <0.2 -Advised that blood counts have improved and normalized. Which indicates his severe neutropenia was likely due to sepsis as opposed to any concerns for lymphoma recurrence. -no lab or clinical evidence of Burkitts lymphoma recurence at this time -continue dietary optimization -on abx for treatment/prevention of abdominal infection. -f/u with [facility] for bowel surgery as per scheduled plan  FOLLOW UP: Cancel next appointment  RTC with Dr [name] with labs in 6month   The total time spent in the appt was 25 minutes and more than 50% was on counseling and direct patient cares.  All of the patient's questions were answered with apparent satisfaction. The patient knows to call the clinic with any problems, questions or concerns.    GSullivan LoneMD MGasAAHIVMS SKau HospitalCRogers Mem HsptlHematology/Oncology Physician [facility] (Office):       [phone](Work cell):  [phone](Fax):           [phone] I, AScot Dock am acting as a scribe for Dr. GSullivan Lone   .I have reviewed the above documentation for accuracy and completeness, and I agree with the above. .Brunetta GeneraMD

## 2019-07-13 ENCOUNTER — Telehealth: Payer: Self-pay | Admitting: Hematology

## 2019-07-13 DIAGNOSIS — R269 Unspecified abnormalities of gait and mobility: Secondary | ICD-10-CM | POA: Diagnosis not present

## 2019-07-13 DIAGNOSIS — C162 Malignant neoplasm of body of stomach: Secondary | ICD-10-CM | POA: Diagnosis not present

## 2019-07-13 NOTE — Telephone Encounter (Signed)
Scheduled appt per 10/29 los.  Spoke with patient spouse and she is aware of the appt date and time.

## 2019-07-17 DIAGNOSIS — R269 Unspecified abnormalities of gait and mobility: Secondary | ICD-10-CM | POA: Diagnosis not present

## 2019-07-17 DIAGNOSIS — C162 Malignant neoplasm of body of stomach: Secondary | ICD-10-CM | POA: Diagnosis not present

## 2019-07-18 ENCOUNTER — Encounter: Payer: Self-pay | Admitting: Family Medicine

## 2019-07-19 DIAGNOSIS — R269 Unspecified abnormalities of gait and mobility: Secondary | ICD-10-CM | POA: Diagnosis not present

## 2019-07-19 DIAGNOSIS — C162 Malignant neoplasm of body of stomach: Secondary | ICD-10-CM | POA: Diagnosis not present

## 2019-07-20 DIAGNOSIS — C162 Malignant neoplasm of body of stomach: Secondary | ICD-10-CM | POA: Diagnosis not present

## 2019-07-20 DIAGNOSIS — R269 Unspecified abnormalities of gait and mobility: Secondary | ICD-10-CM | POA: Diagnosis not present

## 2019-07-23 DIAGNOSIS — Z932 Ileostomy status: Secondary | ICD-10-CM | POA: Diagnosis not present

## 2019-07-24 ENCOUNTER — Encounter: Payer: Self-pay | Admitting: Family Medicine

## 2019-07-24 ENCOUNTER — Ambulatory Visit (INDEPENDENT_AMBULATORY_CARE_PROVIDER_SITE_OTHER): Payer: BC Managed Care – PPO | Admitting: Family Medicine

## 2019-07-24 VITALS — Ht 69.0 in | Wt 199.0 lb

## 2019-07-24 DIAGNOSIS — I10 Essential (primary) hypertension: Secondary | ICD-10-CM

## 2019-07-24 DIAGNOSIS — C162 Malignant neoplasm of body of stomach: Secondary | ICD-10-CM | POA: Diagnosis not present

## 2019-07-24 DIAGNOSIS — K5732 Diverticulitis of large intestine without perforation or abscess without bleeding: Secondary | ICD-10-CM | POA: Diagnosis not present

## 2019-07-24 DIAGNOSIS — R269 Unspecified abnormalities of gait and mobility: Secondary | ICD-10-CM | POA: Diagnosis not present

## 2019-07-24 DIAGNOSIS — F325 Major depressive disorder, single episode, in full remission: Secondary | ICD-10-CM

## 2019-07-24 DIAGNOSIS — E785 Hyperlipidemia, unspecified: Secondary | ICD-10-CM | POA: Diagnosis not present

## 2019-07-24 NOTE — Patient Instructions (Addendum)
Health Maintenance Due  Topic Date Due  . PNEUMOCOCCAL POLYSACCHARIDE VACCINE AGE 63-64 HIGH RISK -not doing it 07/15/1958  . TETANUS/TDAP  09/17/2008  . OPHTHALMOLOGY EXAM -needs to schedule 03/23/2016  . FOOT EXAM  01/31/2018  . URINE MICROALBUMIN  01/31/2018  . INFLUENZA VACCINE -not getting one 04/14/2019   Depression screen [facility] 2/9 04/17/2019 05/22/2018  Decreased Interest 2 0  Down, Depressed, Hopeless 2 0  PHQ - 2 Score 4 0  Altered sleeping 0 -  Tired, decreased energy 1 -  Change in appetite 0 -  Feeling bad or failure about yourself  0 -  Trouble concentrating 0 -  Moving slowly or fidgety/restless 0 -  Suicidal thoughts 0 -  PHQ-9 Score 5 -  Difficult doing work/chores Not difficult at all -  Some recent data might be hidden

## 2019-07-24 NOTE — Progress Notes (Signed)
Phone [phone] Virtual visit via Video note   Subjective:  Chief complaint: Chief Complaint  Patient presents with  . Follow-up    This visit type was conducted due to national recommendations for restrictions regarding the COVID-19 Pandemic (e.g. social distancing).  This format is felt to be most appropriate for this patient at this time balancing risks to patient and risks to population by having him in for in person visit.  No physical exam was performed (except for noted visual exam or audio findings with Telehealth visits).    Our team/I connected with Mark Frey at  1:40 PM EST by a video enabled telemedicine application ([email or web address] or caregility through epic)  -Interactive audio and video telecommunications were attempted between this provider and patient, however failed, due to patient having technical difficulties (patient was logged in but [email or web address] would not allow our connection for video)  We continued and completed visit with audio only. I verified that I am speaking with the correct person using two identifiers.  Location patient: [place] Location provider: [facility], office Persons participating in the virtual visit:  patient  Location patient: [place] Location provider: [facility], office Persons participating in the virtual visit:  patient  Our team/I discussed the limitations of evaluation and management by telemedicine and the availability of in person appointments. In light of current covid-19 pandemic, patient also understands that we are trying to protect them by minimizing in office contact if at all possible.  The patient expressed consent for telemedicine visit and agreed to proceed. Patient understands insurance will be billed.   ROS- some fatigue. No fever/chills. No calf pain reported.    Past Medical History-  Patient Active Problem List   Diagnosis Date Noted  . Bowel obstruction in setting of lymphoma and sigmoid stricture 07/21/2018   Priority: High  . Deep vein thrombosis (DVT) of femoral vein of right lower extremity (HCC)     Priority: High  . 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 High grade B-cell lymphoma ([place]) 03/15/2018    Priority: High  . Bilateral pulmonary embolism ([place]) 03/10/2018    Priority: High  . Controlled diabetes mellitus type II without complication ([place]) 74/94/4967    Priority: High  . Depression, major, single episode, complete remission ([place]) 04/17/2019    Priority: Medium  . Tachycardia 01/31/2017    Priority: Medium  . Hypertension 06/05/2014    Priority: Medium  . Irritable bowel syndrome 03/30/2010    Priority: Medium  . Hyperlipidemia 05/12/2007    Priority: Medium  . 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 Dysmetabolic syndrome X 59/16/3846    Priority: Low  . BACK PAIN WITH RADICULOPATHY 06/09/2007    Priority: Low  . ALLERGIC RHINITIS 05/12/2007    Priority: Low  . Diverticulitis large intestine 07/03/2019  . Intra-abdominal abscess ([place]) 05/04/2019  . Pancolitis ([place]) 02/22/2019  . 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 Thrombocytopenia ([place])   . Symptomatic anemia   . Atrophy of muscle of multiple sites   . Goals of care, counseling/discussion   . Hypomagnesemia 01/11/2019  . Neutropenia ([place]) 01/11/2019  . Neutropenia with fever ([place])   . Pressure injury of skin 01/07/2019  . Zinc deficiency   . Radiation gastroenteritis   . Radiation colitis   . Hyponatremia 01/01/2019  . Pancytopenia ([place]) 01/01/2019  . Hematochezia   . Loop ilesotomy for  fecal diversion of sigmoid stricture 08/25/2018  . Severe protein-calorie malnutrition ([place]) 08/19/2018  . Low magnesium level 07/30/2018  . Edema   . Counseling regarding advance care planning and goals of care 04/25/2018  . Gastrointestinal hemorrhage   . Port-A-Cath in place 03/27/2018  . Occult blood in  stools 03/10/2018    Medications- reviewed and updated Current Outpatient Medications  Medication Sig Dispense Refill  . acetaminophen (TYLENOL) 325 MG tablet Take 2 tablets (650 mg total) by mouth every 6 (six) hours as needed for mild pain (or Fever >/= 101). 30 tablet 0  . apixaban (ELIQUIS) 5 MG TABS tablet DO NOT TAKE UNTIL SEEN BY YOUR ONCOLOGIST (Patient taking differently: Take 5 mg by mouth 2 (two) times daily. DO NOT TAKE UNTIL SEEN BY YOUR ONCOLOGIST) 60 tablet 3  . B Complex Vitamins (VITAMIN B COMPLEX PO) Take by mouth.    . blood glucose meter kit and supplies Dispense based on patient and insurance preference. Use daily as directed. (E11.9). 1 each 0  . citalopram (CELEXA) 20 MG tablet Take 1 tablet (20 mg total) by mouth daily. 90 tablet 3  . dexamethasone (DECADRON) 1 MG tablet Take 1 tablet (1 mg total) by mouth every morning. (Patient taking differently: Take 1 mg by mouth. Mon, Wed, Friday) 30 tablet 2  . glucose blood (FREESTYLE TEST STRIPS) test strip Use to check blood sugar daily 100 each 4  . HYDROcodone-acetaminophen (NORCO) 10-325 MG tablet Take 1 tablet by mouth every 6 (six) hours as needed for moderate pain or severe pain. 30 tablet 0  . magnesium oxide (MAG-OX) 400 (241.3 Mg) MG tablet Take 1 tablet (400 mg total) by mouth 2 (two) times daily. 60 tablet 0  . Zinc Sulfate 220 (50 Zn) MG TABS Take 1 tablet by mouth 2 (two) times daily.    [name] dronabinol (MARINOL) 5 MG capsule Take 1 capsule (5 mg total) by mouth 2 (two) times daily before lunch and supper. (Patient not taking: Reported on 07/24/2019) 60 capsule 0   No current facility-administered medications for this visit.      Objective:  Ht 5' 9" (1.753 m)   Wt 199 lb (90.3 kg)   BMI 29.39 kg/m  self reported vitals Gen: NAD, resting comfortably per voice    Assessment and Plan   #Hospital follow-up for diverticulitis S: Patient hospitalized October 2020 for 3 days with sepsis secondary to colonic  diverticulitis.  After this admission patient finally made the decision he should move forward with surgery-potentially complete colectomy and he is aware he will have a colostomy bag.  During hospitalization was treated with IV Zosyn and transition to [facility] and Flagyl before discharge.  Discharge notes state plan for total of 7 days but upon further consultation with surgery at [facility] was for him to remain on antibiotics long-term until colectomy-he is now scheduled for the 16th  Patient also had neutropenia while ill recently-this resolved on follow-up with oncology-not thought to be related to Burkitt lymphoma A/P: Fortunately no recurrence of diverticulitis on antibiotics and now has a plan to reduce recurrent hospitalizations/severe illness by moving forward with surgery-it is thought prior radiation is causing significant strictures which are worsening episodes of diverticulitis potentially.  Patient is going to be having surgery with Dr. Victorio Palm at [facility] was kind enough to call several weeks ago to check on patient and seems to be passionate about helping him feel better. -Sounds like current plan is to stop Eliquis on the 13th  per 1 preop visit but anesthesiology mentioned the 12th-we are going to clarify with surgical team -Anesthesiology suggested he take pain medicine before coming in on the day of surgery-he is also going to check with surgical team -Neutropenia resolved and was likely due to sepsis  #Protein calorie malnutrition S: Patient did substantially better after being started on Decadron at outside hospital.  We have been slowly weaning and he is down to twice a week A/P: We opted to transition to once a week at this time and potentially can come off at follow-up-did not want to stop at this time since he is going into surgery and may have increased stress-decreased appetite.    #hypertension S: Not currently treated BP Readings from Last 3 Encounters:  07/12/19 126/74   07/05/19 103/71  05/31/19 105/71  A/P: Well-controlled without medication likely related to weight loss  # Depression S: PHQ-9 of 0 today-unclear why epic will not let us input all values-he scored a 0 for moving slowly and suicidal thoughts and denies difficulty with doing work/taking care of things at home.  He remains compliant with Celexa which has been helpful for him Depression screen [facility] 2/9 07/24/2019  Decreased Interest 0  Down, Depressed, Hopeless 0  PHQ - 2 Score 0  Altered sleeping 0  Tired, decreased energy 0  Change in appetite 0  Feeling bad or failure about yourself  0  Trouble concentrating 0  Moving slowly or fidgety/restless -  Suicidal thoughts -  PHQ-9 Score 0  Difficult doing work/chores -  Some recent data might be hidden  A/P: Patient is doing well and we opted to continue Celexa at this time-depression appears to be in full remission  # Diabetes S: Currently diet controlled Lab Results  Component Value Date   HGBA1C 6.0 (H) 01/25/2019   HGBA1C 5.4 03/10/2018   HGBA1C 6.6 12/28/2017   A/P: With maintained weight loss and decreased intake I did not feel strongly about bringing patient in for labs at this time-hopefully will check an A1c while hospitalized-if not we will complete this at follow-up   #Hyperlipidemia-continues to firmly declines statin despite his diabetes  #Burkitt lymphoma-appear to have a good check in with Dr. Ammie Dalton with plan for follow-up in 3 months  #History of DVT/PE-likely related to B-cell lymphoma and sedentary activity in the past-he completed 6 months of therapy-okay with him being off short-term for surgery-would defer to oncology/hematology in regards to taking him off this medication long-term  Recommended follow up: We did not schedule plan follow-up-I will plan to look at chart in 4 months if we have not seen each other and may invite him for a visit at that time-certainly happy to see him sooner if needed Future  Appointments  Date Time Provider [place]  10/11/2019  2:00 PM [facility] 1 [facility] None  10/11/2019  2:40 PM [name], MD [facility] None    Lab/Order associations:   ICD-10-CM   1. Diverticulitis of large intestine without bleeding, unspecified complication status  C94.49   2. Essential hypertension  I10   3. Hyperlipidemia, unspecified hyperlipidemia type  E78.5   4. Depression, major, single episode, complete remission ([place])  F32.5    Return precautions advised.  Garret Reddish, MD

## 2019-07-27 DIAGNOSIS — Z01812 Encounter for preprocedural laboratory examination: Secondary | ICD-10-CM | POA: Diagnosis not present

## 2019-07-27 DIAGNOSIS — Z20828 Contact with and (suspected) exposure to other viral communicable diseases: Secondary | ICD-10-CM | POA: Diagnosis not present

## 2019-07-30 DIAGNOSIS — N138 Other obstructive and reflux uropathy: Secondary | ICD-10-CM | POA: Diagnosis not present

## 2019-07-30 DIAGNOSIS — Z432 Encounter for attention to ileostomy: Secondary | ICD-10-CM | POA: Diagnosis not present

## 2019-07-30 DIAGNOSIS — K660 Peritoneal adhesions (postprocedural) (postinfection): Secondary | ICD-10-CM | POA: Diagnosis not present

## 2019-07-30 DIAGNOSIS — K5720 Diverticulitis of large intestine with perforation and abscess without bleeding: Secondary | ICD-10-CM | POA: Diagnosis not present

## 2019-07-30 DIAGNOSIS — Z5331 Laparoscopic surgical procedure converted to open procedure: Secondary | ICD-10-CM | POA: Diagnosis not present

## 2019-07-30 DIAGNOSIS — K5732 Diverticulitis of large intestine without perforation or abscess without bleeding: Secondary | ICD-10-CM | POA: Diagnosis not present

## 2019-07-30 HISTORY — PX: COLECTOMY: SHX59

## 2019-07-31 DIAGNOSIS — K5720 Diverticulitis of large intestine with perforation and abscess without bleeding: Secondary | ICD-10-CM | POA: Diagnosis not present

## 2019-08-01 DIAGNOSIS — R6510 Systemic inflammatory response syndrome (SIRS) of non-infectious origin without acute organ dysfunction: Secondary | ICD-10-CM | POA: Diagnosis not present

## 2019-08-01 DIAGNOSIS — R579 Shock, unspecified: Secondary | ICD-10-CM | POA: Diagnosis not present

## 2019-08-01 DIAGNOSIS — K5720 Diverticulitis of large intestine with perforation and abscess without bleeding: Secondary | ICD-10-CM | POA: Diagnosis not present

## 2019-08-01 DIAGNOSIS — I4892 Unspecified atrial flutter: Secondary | ICD-10-CM | POA: Diagnosis not present

## 2019-08-01 DIAGNOSIS — R0902 Hypoxemia: Secondary | ICD-10-CM | POA: Diagnosis not present

## 2019-08-01 DIAGNOSIS — R000 Tachycardia, unspecified: Secondary | ICD-10-CM | POA: Diagnosis not present

## 2019-08-01 DIAGNOSIS — J90 Pleural effusion, not elsewhere classified: Secondary | ICD-10-CM | POA: Diagnosis not present

## 2019-08-01 DIAGNOSIS — E872 Acidosis: Secondary | ICD-10-CM | POA: Diagnosis not present

## 2019-08-01 DIAGNOSIS — R0689 Other abnormalities of breathing: Secondary | ICD-10-CM | POA: Diagnosis not present

## 2019-08-01 DIAGNOSIS — J9811 Atelectasis: Secondary | ICD-10-CM | POA: Diagnosis not present

## 2019-08-02 DIAGNOSIS — I471 Supraventricular tachycardia: Secondary | ICD-10-CM | POA: Diagnosis not present

## 2019-08-02 DIAGNOSIS — R140 Abdominal distension (gaseous): Secondary | ICD-10-CM | POA: Diagnosis not present

## 2019-08-02 DIAGNOSIS — K30 Functional dyspepsia: Secondary | ICD-10-CM | POA: Diagnosis not present

## 2019-08-02 DIAGNOSIS — E119 Type 2 diabetes mellitus without complications: Secondary | ICD-10-CM | POA: Diagnosis not present

## 2019-08-02 DIAGNOSIS — R1110 Vomiting, unspecified: Secondary | ICD-10-CM | POA: Diagnosis not present

## 2019-08-02 DIAGNOSIS — Z86711 Personal history of pulmonary embolism: Secondary | ICD-10-CM | POA: Diagnosis not present

## 2019-08-02 DIAGNOSIS — R0902 Hypoxemia: Secondary | ICD-10-CM | POA: Diagnosis not present

## 2019-08-02 DIAGNOSIS — K5720 Diverticulitis of large intestine with perforation and abscess without bleeding: Secondary | ICD-10-CM | POA: Diagnosis not present

## 2019-08-03 DIAGNOSIS — K5720 Diverticulitis of large intestine with perforation and abscess without bleeding: Secondary | ICD-10-CM | POA: Diagnosis not present

## 2019-08-05 DIAGNOSIS — K5720 Diverticulitis of large intestine with perforation and abscess without bleeding: Secondary | ICD-10-CM | POA: Diagnosis not present

## 2019-08-06 DIAGNOSIS — K5720 Diverticulitis of large intestine with perforation and abscess without bleeding: Secondary | ICD-10-CM | POA: Diagnosis not present

## 2019-08-07 DIAGNOSIS — Z932 Ileostomy status: Secondary | ICD-10-CM | POA: Diagnosis not present

## 2019-08-07 DIAGNOSIS — R188 Other ascites: Secondary | ICD-10-CM | POA: Diagnosis not present

## 2019-08-08 DIAGNOSIS — R188 Other ascites: Secondary | ICD-10-CM | POA: Diagnosis not present

## 2019-08-08 DIAGNOSIS — K5720 Diverticulitis of large intestine with perforation and abscess without bleeding: Secondary | ICD-10-CM | POA: Diagnosis not present

## 2019-08-12 DIAGNOSIS — M7989 Other specified soft tissue disorders: Secondary | ICD-10-CM | POA: Diagnosis not present

## 2019-08-13 DIAGNOSIS — S31109A Unspecified open wound of abdominal wall, unspecified quadrant without penetration into peritoneal cavity, initial encounter: Secondary | ICD-10-CM | POA: Diagnosis not present

## 2019-08-14 DIAGNOSIS — Z4803 Encounter for change or removal of drains: Secondary | ICD-10-CM | POA: Diagnosis not present

## 2019-08-14 DIAGNOSIS — R188 Other ascites: Secondary | ICD-10-CM | POA: Diagnosis not present

## 2019-08-14 DIAGNOSIS — K5720 Diverticulitis of large intestine with perforation and abscess without bleeding: Secondary | ICD-10-CM | POA: Diagnosis not present

## 2019-08-18 DIAGNOSIS — N289 Disorder of kidney and ureter, unspecified: Secondary | ICD-10-CM | POA: Diagnosis not present

## 2019-08-18 DIAGNOSIS — R609 Edema, unspecified: Secondary | ICD-10-CM | POA: Diagnosis not present

## 2019-08-18 DIAGNOSIS — N200 Calculus of kidney: Secondary | ICD-10-CM | POA: Diagnosis not present

## 2019-08-20 DIAGNOSIS — K5720 Diverticulitis of large intestine with perforation and abscess without bleeding: Secondary | ICD-10-CM | POA: Diagnosis not present

## 2019-08-20 DIAGNOSIS — R188 Other ascites: Secondary | ICD-10-CM | POA: Diagnosis not present

## 2019-08-21 DIAGNOSIS — K5720 Diverticulitis of large intestine with perforation and abscess without bleeding: Secondary | ICD-10-CM | POA: Diagnosis not present

## 2019-08-22 DIAGNOSIS — K5720 Diverticulitis of large intestine with perforation and abscess without bleeding: Secondary | ICD-10-CM | POA: Diagnosis not present

## 2019-08-22 DIAGNOSIS — Z932 Ileostomy status: Secondary | ICD-10-CM | POA: Diagnosis not present

## 2019-08-22 DIAGNOSIS — Z9049 Acquired absence of other specified parts of digestive tract: Secondary | ICD-10-CM | POA: Diagnosis not present

## 2019-08-22 DIAGNOSIS — R638 Other symptoms and signs concerning food and fluid intake: Secondary | ICD-10-CM | POA: Diagnosis not present

## 2019-08-22 DIAGNOSIS — R5381 Other malaise: Secondary | ICD-10-CM | POA: Diagnosis not present

## 2019-08-23 DIAGNOSIS — K5720 Diverticulitis of large intestine with perforation and abscess without bleeding: Secondary | ICD-10-CM | POA: Diagnosis not present

## 2019-08-24 DIAGNOSIS — T83038A Leakage of other indwelling urethral catheter, initial encounter: Secondary | ICD-10-CM | POA: Diagnosis not present

## 2019-08-24 DIAGNOSIS — T8189XA Other complications of procedures, not elsewhere classified, initial encounter: Secondary | ICD-10-CM | POA: Diagnosis not present

## 2019-08-24 DIAGNOSIS — Z8572 Personal history of non-Hodgkin lymphomas: Secondary | ICD-10-CM | POA: Diagnosis not present

## 2019-08-24 DIAGNOSIS — Z86718 Personal history of other venous thrombosis and embolism: Secondary | ICD-10-CM | POA: Diagnosis not present

## 2019-08-24 DIAGNOSIS — K5720 Diverticulitis of large intestine with perforation and abscess without bleeding: Secondary | ICD-10-CM | POA: Diagnosis not present

## 2019-08-24 DIAGNOSIS — R5381 Other malaise: Secondary | ICD-10-CM | POA: Diagnosis not present

## 2019-08-24 DIAGNOSIS — Z7901 Long term (current) use of anticoagulants: Secondary | ICD-10-CM | POA: Diagnosis not present

## 2019-08-24 DIAGNOSIS — E119 Type 2 diabetes mellitus without complications: Secondary | ICD-10-CM | POA: Diagnosis not present

## 2019-08-24 DIAGNOSIS — Z5320 Procedure and treatment not carried out because of patient's decision for unspecified reasons: Secondary | ICD-10-CM | POA: Diagnosis not present

## 2019-08-24 DIAGNOSIS — I4892 Unspecified atrial flutter: Secondary | ICD-10-CM | POA: Diagnosis not present

## 2019-08-24 DIAGNOSIS — Z923 Personal history of irradiation: Secondary | ICD-10-CM | POA: Diagnosis not present

## 2019-08-24 DIAGNOSIS — Z9049 Acquired absence of other specified parts of digestive tract: Secondary | ICD-10-CM | POA: Diagnosis not present

## 2019-08-24 DIAGNOSIS — Z9221 Personal history of antineoplastic chemotherapy: Secondary | ICD-10-CM | POA: Diagnosis not present

## 2019-08-24 DIAGNOSIS — Y836 Removal of other organ (partial) (total) as the cause of abnormal reaction of the patient, or of later complication, without mention of misadventure at the time of the procedure: Secondary | ICD-10-CM | POA: Diagnosis not present

## 2019-08-24 DIAGNOSIS — Z86711 Personal history of pulmonary embolism: Secondary | ICD-10-CM | POA: Diagnosis not present

## 2019-08-24 DIAGNOSIS — Z932 Ileostomy status: Secondary | ICD-10-CM | POA: Diagnosis not present

## 2019-08-24 DIAGNOSIS — T8141XA Infection following a procedure, superficial incisional surgical site, initial encounter: Secondary | ICD-10-CM | POA: Diagnosis not present

## 2019-08-24 DIAGNOSIS — E871 Hypo-osmolality and hyponatremia: Secondary | ICD-10-CM | POA: Diagnosis not present

## 2019-08-24 DIAGNOSIS — L03311 Cellulitis of abdominal wall: Secondary | ICD-10-CM | POA: Diagnosis not present

## 2019-08-24 DIAGNOSIS — Z933 Colostomy status: Secondary | ICD-10-CM | POA: Diagnosis not present

## 2019-08-28 ENCOUNTER — Ambulatory Visit: Payer: BC Managed Care – PPO | Admitting: Hematology

## 2019-08-28 ENCOUNTER — Other Ambulatory Visit: Payer: BC Managed Care – PPO

## 2019-08-30 ENCOUNTER — Telehealth: Payer: Self-pay | Admitting: Family Medicine

## 2019-08-30 NOTE — Telephone Encounter (Signed)
Please advise.   Copied from [place] [id number]. Topic: Appointment Scheduling - Scheduling Inquiry for Clinic >> Aug 30, 2019  9:27 AM Mathis Bud wrote: Reason for CRM: Vikki Ports from [facility] rehab requesting to schedule a 1-3 weeks HFU.  Patient is being discharged 12/18.  PCP does not have anything till 1/19.  [facility] is requesting office to reach out to patient.

## 2019-08-30 NOTE — Telephone Encounter (Signed)
Ok to put in same day?

## 2019-08-30 NOTE — Telephone Encounter (Signed)
Yes thanks 

## 2019-08-31 NOTE — Telephone Encounter (Signed)
Please call for appointment ok for same day

## 2019-08-31 NOTE — Telephone Encounter (Signed)
PAITENTS WIFE SAID SHE WOULD CALL BACK AND SCHEDULE HE IS STILL IN [place]

## 2019-09-04 DIAGNOSIS — Z20828 Contact with and (suspected) exposure to other viral communicable diseases: Secondary | ICD-10-CM | POA: Diagnosis not present

## 2019-09-04 DIAGNOSIS — E860 Dehydration: Secondary | ICD-10-CM | POA: Diagnosis not present

## 2019-09-04 DIAGNOSIS — K651 Peritoneal abscess: Secondary | ICD-10-CM | POA: Diagnosis not present

## 2019-09-04 DIAGNOSIS — Z932 Ileostomy status: Secondary | ICD-10-CM | POA: Diagnosis not present

## 2019-09-04 DIAGNOSIS — R188 Other ascites: Secondary | ICD-10-CM | POA: Diagnosis not present

## 2019-09-04 DIAGNOSIS — C8370 Burkitt lymphoma, unspecified site: Secondary | ICD-10-CM | POA: Diagnosis not present

## 2019-09-04 DIAGNOSIS — T8143XA Infection following a procedure, organ and space surgical site, initial encounter: Secondary | ICD-10-CM | POA: Diagnosis not present

## 2019-09-04 DIAGNOSIS — Z978 Presence of other specified devices: Secondary | ICD-10-CM | POA: Diagnosis not present

## 2019-09-04 DIAGNOSIS — Z9221 Personal history of antineoplastic chemotherapy: Secondary | ICD-10-CM | POA: Diagnosis not present

## 2019-09-04 DIAGNOSIS — K5720 Diverticulitis of large intestine with perforation and abscess without bleeding: Secondary | ICD-10-CM | POA: Diagnosis not present

## 2019-09-04 DIAGNOSIS — Y838 Other surgical procedures as the cause of abnormal reaction of the patient, or of later complication, without mention of misadventure at the time of the procedure: Secondary | ICD-10-CM | POA: Diagnosis not present

## 2019-09-04 DIAGNOSIS — Z923 Personal history of irradiation: Secondary | ICD-10-CM | POA: Diagnosis not present

## 2019-09-04 DIAGNOSIS — Z9049 Acquired absence of other specified parts of digestive tract: Secondary | ICD-10-CM | POA: Diagnosis not present

## 2019-09-05 DIAGNOSIS — K651 Peritoneal abscess: Secondary | ICD-10-CM | POA: Diagnosis not present

## 2019-09-05 DIAGNOSIS — Z923 Personal history of irradiation: Secondary | ICD-10-CM | POA: Diagnosis not present

## 2019-09-05 DIAGNOSIS — T8143XA Infection following a procedure, organ and space surgical site, initial encounter: Secondary | ICD-10-CM | POA: Diagnosis not present

## 2019-09-05 DIAGNOSIS — Z20828 Contact with and (suspected) exposure to other viral communicable diseases: Secondary | ICD-10-CM | POA: Diagnosis not present

## 2019-09-05 DIAGNOSIS — Z9049 Acquired absence of other specified parts of digestive tract: Secondary | ICD-10-CM | POA: Diagnosis not present

## 2019-09-05 DIAGNOSIS — Z978 Presence of other specified devices: Secondary | ICD-10-CM | POA: Diagnosis not present

## 2019-09-05 DIAGNOSIS — Z9221 Personal history of antineoplastic chemotherapy: Secondary | ICD-10-CM | POA: Diagnosis not present

## 2019-09-05 DIAGNOSIS — C8370 Burkitt lymphoma, unspecified site: Secondary | ICD-10-CM | POA: Diagnosis not present

## 2019-09-05 DIAGNOSIS — E860 Dehydration: Secondary | ICD-10-CM | POA: Diagnosis not present

## 2019-09-05 DIAGNOSIS — Y838 Other surgical procedures as the cause of abnormal reaction of the patient, or of later complication, without mention of misadventure at the time of the procedure: Secondary | ICD-10-CM | POA: Diagnosis not present

## 2019-09-05 DIAGNOSIS — K5720 Diverticulitis of large intestine with perforation and abscess without bleeding: Secondary | ICD-10-CM | POA: Diagnosis not present

## 2019-09-05 DIAGNOSIS — Z932 Ileostomy status: Secondary | ICD-10-CM | POA: Diagnosis not present

## 2019-09-09 DIAGNOSIS — K6811 Postprocedural retroperitoneal abscess: Secondary | ICD-10-CM | POA: Diagnosis not present

## 2019-09-09 DIAGNOSIS — Z20828 Contact with and (suspected) exposure to other viral communicable diseases: Secondary | ICD-10-CM | POA: Diagnosis not present

## 2019-09-09 DIAGNOSIS — R1110 Vomiting, unspecified: Secondary | ICD-10-CM | POA: Diagnosis not present

## 2019-09-09 DIAGNOSIS — R197 Diarrhea, unspecified: Secondary | ICD-10-CM | POA: Diagnosis not present

## 2019-10-02 ENCOUNTER — Telehealth: Payer: Self-pay

## 2019-10-02 ENCOUNTER — Encounter: Payer: Self-pay | Admitting: Hematology

## 2019-10-02 ENCOUNTER — Encounter: Payer: Self-pay | Admitting: Family Medicine

## 2019-10-02 NOTE — Telephone Encounter (Signed)
-----   Message from [name], MD sent at 10/02/2019  3:21 PM EST ----- Noted -- will discuss further with them at visit. Thx GK ----- Message ----- From: Tami Lin, RN Sent: 10/02/2019  11:21 AM EST To: [name], MD  Patient's wife just wanted to make you aware that Mr. Gago has not taken Eliquis since January 1st per instructions from his surgeon at [facility] who did his colectomy. From the surgeons standpoint, patient does not need Eliquis but patient's wife just wanted to make you aware.  His next visit with you is on 10/11/19. Lanelle Bal

## 2019-10-09 ENCOUNTER — Ambulatory Visit (INDEPENDENT_AMBULATORY_CARE_PROVIDER_SITE_OTHER): Payer: 59 | Admitting: Family Medicine

## 2019-10-09 ENCOUNTER — Encounter: Payer: Self-pay | Admitting: Family Medicine

## 2019-10-09 DIAGNOSIS — C8510 Unspecified B-cell lymphoma, unspecified site: Secondary | ICD-10-CM

## 2019-10-09 DIAGNOSIS — F325 Major depressive disorder, single episode, in full remission: Secondary | ICD-10-CM | POA: Diagnosis not present

## 2019-10-09 DIAGNOSIS — E785 Hyperlipidemia, unspecified: Secondary | ICD-10-CM

## 2019-10-09 DIAGNOSIS — E119 Type 2 diabetes mellitus without complications: Secondary | ICD-10-CM

## 2019-10-09 DIAGNOSIS — I10 Essential (primary) hypertension: Secondary | ICD-10-CM

## 2019-10-09 DIAGNOSIS — Z86718 Personal history of other venous thrombosis and embolism: Secondary | ICD-10-CM

## 2019-10-09 DIAGNOSIS — Z9049 Acquired absence of other specified parts of digestive tract: Secondary | ICD-10-CM

## 2019-10-09 NOTE — Assessment & Plan Note (Addendum)
S: HEMOGLOBIN A1c of 5.4 at [facility] on 08/26/2019.  Patient remains off all medication.  Has had substantial weight loss per wife down over 100 pounds from peak. A/P: Excellent control though I hate because of all of his weight loss is what he has had to go through.  Discussed 66-monthfollow-up and can repeat A1c at that time.  For now continue without medication  -No further weight loss recommended-goal actually at this point is weight gain

## 2019-10-09 NOTE — Assessment & Plan Note (Signed)
S: Patient previously declined medication-no longer needs this after substantial weight loss from his cancer/treatments and then later colectomy BP Readings from Last 3 Encounters:  07/12/19 126/74  07/05/19 103/71  05/31/19 105/71  A/P: Well-controlled on medication-continue to monitor

## 2019-10-09 NOTE — Assessment & Plan Note (Signed)
S: Patient continues to follow-up closely with oncology [name] with last visit October 2020 and plan follow-up within a month A/P: Likely patient is doing well from [place] that being said chemotherapy and radiation likely contributed to ultimate need for colectomy.  Unfortunate side effect of the necessary treatment

## 2019-10-09 NOTE — Patient Instructions (Addendum)
Health Maintenance Due  Topic Date Due  . PNEUMOCOCCAL POLYSACCHARIDE VACCINE AGE 64-64 HIGH RISK - will discuss at [place] 07/15/1958  . TETANUS/TDAP - will discuss at [place] 09/17/2008  . OPHTHALMOLOGY EXAM -will discuss at [place] 03/23/2016  . FOOT EXAM -will discuss at [place] 01/31/2018  . URINE MICROALBUMIN -will discuss at [place] 01/31/2018  . INFLUENZA VACCINE -did not get 04/14/2019  . HEMOGLOBIN A1C -will discuss at [place] 07/28/2019   Depression screen [facility] 2/9 07/24/2019 04/17/2019 05/22/2018  Decreased Interest 0 2 0  Down, Depressed, Hopeless 0 2 0  PHQ - 2 Score 0 4 0  Altered sleeping 0 0 -  Tired, decreased energy 0 1 -  Change in appetite 0 0 -  Feeling bad or failure about yourself  0 0 -  Trouble concentrating 0 0 -  Moving slowly or fidgety/restless - 0 -  Suicidal thoughts - 0 -  PHQ-9 Score 0 5 -  Difficult doing work/chores - Not difficult at all -  Some recent data might be hidden

## 2019-10-09 NOTE — Assessment & Plan Note (Signed)
S: Lipids previously elevated for weight loss and declines statin despite diabetes. A/P: We will plan to see patient back in 4 months and can update full lipid panel at that time-last in the system was 2018

## 2019-10-09 NOTE — Progress Notes (Signed)
Phone [phone] Virtual visit via Video note   Subjective:  Chief complaint: Chief Complaint  Patient presents with  . virtual  . surgery f/u   This visit type was conducted due to national recommendations for restrictions regarding the COVID-19 Pandemic (e.g. social distancing).  This format is felt to be most appropriate for this patient at this time balancing risks to patient and risks to population by having him in for in person visit.  No physical exam was performed (except for noted visual exam or audio findings with Telehealth visits).    Our team/I connected with [name] at  4:20 PM EST by a video enabled telemedicine application (doxy.me or caregility through epic) and verified that I am speaking with the correct person using two identifiers.  Location patient: Home-[place] Location provider: [facility], office Persons participating in the virtual visit:  patient, wife Our team/I discussed the limitations of evaluation and management by telemedicine and the availability of in person appointments. In light of current covid-19 pandemic, patient also understands that we are trying to protect them by minimizing in office contact if at all possible.  The patient expressed consent for telemedicine visit and agreed to proceed. Patient understands insurance will be billed.   Past Medical History-  Patient Active Problem List   Diagnosis Date Noted  . Status post colectomy 10/09/2019    Priority: High  . Bowel obstruction in setting of lymphoma and sigmoid stricture 07/21/2018    Priority: High  . History of deep venous thrombosis (DVT) of distal vein of right lower extremity     Priority: High  . 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 Bilateral pulmonary embolism ([place]) 03/10/2018    Priority: High  . Controlled diabetes mellitus type II without complication  ([place]) 03/75/4360    Priority: High  . Depression, major, single episode, complete remission ([place]) 04/17/2019    Priority: Medium  . Tachycardia 01/31/2017    Priority: Medium  . Hypertension 06/05/2014    Priority: Medium  . Irritable bowel syndrome 03/30/2010    Priority: Medium  . Hyperlipidemia 05/12/2007    Priority: Medium  . 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 Dysmetabolic syndrome X 67/70/3403    Priority: Low  . BACK PAIN WITH RADICULOPATHY 06/09/2007    Priority: Low  . ALLERGIC RHINITIS 05/12/2007    Priority: Low  . Diverticulitis large intestine 07/03/2019  . Intra-abdominal abscess ([place]) 05/04/2019  . Pancolitis ([place]) 02/22/2019  . 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 Thrombocytopenia ([place])   . Symptomatic anemia   . Atrophy of muscle of multiple sites   . Goals of care, counseling/discussion   . Hypomagnesemia 01/11/2019  . Neutropenia ([place]) 01/11/2019  . Neutropenia with fever ([place])   . Pressure injury of skin 01/07/2019  . Zinc deficiency   . Radiation gastroenteritis   . Radiation colitis   . Hyponatremia 01/01/2019  . Pancytopenia ([place]) 01/01/2019  . Hematochezia   . Loop ilesotomy for fecal diversion of sigmoid stricture 08/25/2018  . Severe protein-calorie malnutrition ([place]) 08/19/2018  . Low magnesium level 07/30/2018  . Edema   . Counseling regarding advance care planning and goals of care 04/25/2018  . Gastrointestinal hemorrhage   . Port-A-Cath in place 03/27/2018  . Occult blood in stools 03/10/2018  Medications- reviewed and updated Current Outpatient Medications  Medication Sig Dispense Refill  . blood glucose meter kit and supplies Dispense based on patient and insurance preference. Use daily as directed. (E11.9). 1 each 0  . citalopram (CELEXA) 20 MG tablet Take 1 tablet (20 mg total) by mouth daily. 90 tablet 3  . glucose blood (FREESTYLE TEST STRIPS) test strip Use  to check blood sugar daily 100 each 4  . B Complex Vitamins (VITAMIN B COMPLEX PO) Take by mouth.     No current facility-administered medications for this visit.     Objective:  Ht 5' 9"  (1.753 m)   Wt 170 lb (77.1 kg)   BMI 25.10 kg/m  self reported vitals Gen: NAD, resting comfortably in his chair.  Wife with him Lungs: nonlabored, normal respiratory rate  Skin: appears dry, no obvious rash     Assessment and Plan   #Health maintenance-interested in COVID-19 vaccine when available.  We also need to do his final Shingrix vaccination at some point in the future   Status post colectomy S: Patient was hospitalized November 16 for complicated colectomy.  Patient's wife reports that there was a comment around the time of surgery that this was one of the more difficult surgeries that have been performed in regards to colectomy by surgeon-apparently was 6 hours long.  Patient with history of Burkitt lymphoma status post chemotherapy and whole abdominal radiation complicated by recurrent diverticulitis and stricturing which led to need for total abdominal colectomy and end ileostomy.  Complicated postsurgical course with multiple intra-abdominal abscesses status post drain placement.  Was able to be discharged home short-term but had recurrent abdominal abscess and had to have IR aspiration/readmission.  Per [facility] notes from Dr. Victorio Palm from about 2 weeks ago ostomy output was averaging around 1 L a day-no reported increase today.  Family has used Imodium x4 when output increased to 2.5 L at one point.  Family/patient had continue to use Imodium as needed-can be 3 times a day-somewhat difficult to balance.  Per [facility] note skin around ostomy site was improving.  Plan was for 3-week follow-up  Today, patient and wife state he is doing well overall and is starting to see some significant improvements.Patient has struggled to maintain weight until recently-has finally been able to increase p.o. intake  with less nausea.  Patient is trying to increase calories and get protein through protein shakes and protein powder.  Patient was taken off Decadron with hospitalization and this was never restarted.  Weight is down to 170 but has finally stabilized  wife reflects that he spent most of November and December in the hospital with short time at home-challenges navigating drains and wound VAC at home on first discharge.  Finally he was able to be discharged on January 1 and has managed well at home. Last 2 weeks significant improvement in appetite, up and walking, started with PT yesterday with plan for 3 times a week.  Patient is walking with a walker they report Ostomy almost completely healed A/P: Patient appears to finally be recovering well from his colectomy.  Patient feels like he is making progress with his dietary intake as well as physical therapy-we will continue to monitor and follow-up in 4 months-has close follow-up with [facility] surgery within a month -Patient has had less fatigue-we reviewed blood work and his anemia had improved with hemoglobin up to 11.4 from 8.6.  Family reports he required several transfusions while hospitalized.  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 with last visit October 2020 and plan follow-up within a month A/P: Likely patient is doing well from [place] that being said chemotherapy and radiation likely contributed to ultimate need for colectomy.  Unfortunate side effect of the necessary treatment     History of deep venous thrombosis (DVT) of distal vein of right lower extremity S: Patient presented with right lower extremity edema March 09, 2018 and ultimately discovered to have right lower extremity DVT as well as bilateral pulmonary embolism.  This was considered provoked given this was also the time that abdominal mass which was ultimately determined to be Burkitt's lymphoma was  found.  Patient was initially treated with Xarelto and later changed to Eliquis.  Since his surgery patient was taken off of Eliquis most recently-patient is becoming more active.  Denies increased swelling in the legs, shortness of breath, calf pain, leg pain A/P: Patient with prior provoked DVT likely related to [place] which has been successfully treated and is undergoing monitoring at present.  He has been taken off of Eliquis.  I instructed him to monitor for signs of recurrence.  He is also going to discuss with [name] at next visit.  I am thankful patient is becoming more mobile sedentary activity risk for recurrence   Depression, major, single episode, complete remission ([place]) # Depression S: PHQ-9 is well controlled on citalopram. Depression screen [facility] 2/9 10/09/2019  Decreased Interest 0  Down, Depressed, Hopeless 0  PHQ - 2 Score 0  Altered sleeping 0  Tired, decreased energy 0  Change in appetite 0  Feeling bad or failure about yourself  0  Trouble concentrating 0  Moving slowly or fidgety/restless 0  Suicidal thoughts 0  PHQ-9 Score 0  Difficult doing work/chores Not difficult at all  Some recent data might be hidden   A/P: Controlled-continue current medication  Controlled diabetes mellitus type II without complication ([place]) S: HEMOGLOBIN A1c of 5.4 at [facility] on 08/26/2019.  Patient remains off all medication.  Has had substantial weight loss per wife down over 100 pounds from peak. A/P: Excellent control though I hate because of all of his weight loss is what he has had to go through.  Discussed 68-monthfollow-up and can repeat A1c at that time.  For now continue without medication  -No further weight loss recommended-goal actually at this point is weight gain  Hypertension S: Patient previously declined medication-no longer needs this after substantial weight loss from his cancer/treatments and then later colectomy BP Readings from Last 3 Encounters:   07/12/19 126/74  07/05/19 103/71  05/31/19 105/71  A/P: Well-controlled on medication-continue to monitor    Hyperlipidemia S: Lipids previously elevated for weight loss and declines statin despite diabetes. A/P: We will plan to see patient back in 4 months and can update full lipid panel at that time-last in the system was 2018    Recommended follow up: 429-monthollow-up recommended or sooner if needed Future Appointments  Date Time Provider [place]/28/2021  2:00 PM [facility] LAB 1 [facility] None  10/11/2019  2:15 PM [facility] None  10/11/2019  2:40 PM [facility]    Lab/Order associations:   ICD-10-CM   1. Status post colectomy  Z90.49   2. Depression, major, single episode, complete remission ([place] F32.5   3. Controlled type 2 diabetes mellitus without complication, without long-term current use of insulin ([place]  E11.9   4. Essential hypertension  I10   5. Hyperlipidemia, unspecified hyperlipidemia type  E78.5   6. High grade B-cell lymphoma (HCC)  C85.10   7. History of deep venous thrombosis (DVT) of distal vein of right lower extremity  Z86.718    Return precautions advised.  Garret Reddish, MD

## 2019-10-09 NOTE — Assessment & Plan Note (Addendum)
S: Patient was hospitalized November 16 for complicated colectomy.  Patient's wife reports that there was a comment around the time of surgery that this was one of the more difficult surgeries that have been performed in regards to colectomy by surgeon-apparently was 6 hours long.  Patient with history of Burkitt lymphoma status post chemotherapy and whole abdominal radiation complicated by recurrent diverticulitis and stricturing which led to need for total abdominal colectomy and end ileostomy.  Complicated postsurgical course with multiple intra-abdominal abscesses status post drain placement.  Was able to be discharged home short-term but had recurrent abdominal abscess and had to have IR aspiration/readmission.  Per [facility] notes from Dr. Victorio Palm from about 2 weeks ago ostomy output was averaging around 1 L a day-no reported increase today.  Family has used Imodium x4 when output increased to 2.5 L at one point.  Family/patient had continue to use Imodium as needed-can be 3 times a day-somewhat difficult to balance.  Per [facility] note skin around ostomy site was improving.  Plan was for 3-week follow-up  Today, patient and wife state he is doing well overall and is starting to see some significant improvements.Patient has struggled to maintain weight until recently-has finally been able to increase p.o. intake with less nausea.  Patient is trying to increase calories and get protein through protein shakes and protein powder.  Patient was taken off Decadron with hospitalization and this was never restarted.  Weight is down to 170 but has finally stabilized  wife reflects that he spent most of November and December in the hospital with short time at home-challenges navigating drains and wound VAC at home on first discharge.  Finally he was able to be discharged on January 1 and has managed well at home. Last 2 weeks significant improvement in appetite, up and walking, started with PT yesterday with plan for 3 times  a week.  Patient is walking with a walker they report Ostomy almost completely healed A/P: Patient appears to finally be recovering well from his colectomy.  Patient feels like he is making progress with his dietary intake as well as physical therapy-we will continue to monitor and follow-up in 4 months-has close follow-up with [facility] surgery within a month -Patient has had less fatigue-we reviewed blood work and his anemia had improved with hemoglobin up to 11.4 from 8.6.  Family reports he required several transfusions while hospitalized.

## 2019-10-09 NOTE — Assessment & Plan Note (Signed)
#   Depression S: PHQ-9 is well controlled on citalopram. Depression screen [facility] 2/9 10/09/2019  Decreased Interest 0  Down, Depressed, Hopeless 0  PHQ - 2 Score 0  Altered sleeping 0  Tired, decreased energy 0  Change in appetite 0  Feeling bad or failure about yourself  0  Trouble concentrating 0  Moving slowly or fidgety/restless 0  Suicidal thoughts 0  PHQ-9 Score 0  Difficult doing work/chores Not difficult at all  Some recent data might be hidden   A/P: Controlled-continue current medication

## 2019-10-09 NOTE — Assessment & Plan Note (Addendum)
S: Patient presented with right lower extremity edema March 09, 2018 and ultimately discovered to have right lower extremity DVT as well as bilateral pulmonary embolism.  This was considered provoked given this was also the time that abdominal mass which was ultimately determined to be Burkitt's lymphoma was found.  Patient was initially treated with Xarelto and later changed to Eliquis.  Since his surgery patient was taken off of Eliquis most recently-patient is becoming more active.  Denies increased swelling in the legs, shortness of breath, calf pain, leg pain A/P: Patient with prior provoked DVT likely related to [place] which has been successfully treated and is undergoing monitoring at present.  He has been taken off of Eliquis.  I instructed him to monitor for signs of recurrence.  He is also going to discuss with [name] at next visit.  I am thankful patient is becoming more mobile sedentary activity risk for recurrence

## 2019-10-10 LAB — HEMOGLOBIN A1C: A1c: 5.4

## 2019-10-10 NOTE — Progress Notes (Signed)
HEMATOLOGY/ONCOLOGY CLINIC NOTE  Date of Service: 10/11/19    Patient Care Team: [name], MD as PCP - General (Family Medicine)  CHIEF COMPLAINTS/PURPOSE OF CONSULTATION:  F/u for continued Mx of Burkitts lymphoma  HISTORY OF PRESENTING ILLNESS:   Mark Frey is a wonderful 64 y.o. male who has been referred to [place] by my colleague Dr. 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 He is accompanied today by his wife. The pt reports that he is doing well overall.   The pt started R-EPOCH every 3 weeks with neulasta support on 03/17/18. He presented to the ED with right leg swelling, found to be a DVT and bilateral PE, which resulted in the incidental finding of a lower abdomen tumor measuring 18.7 x 18.5 x 17.8 cm. He denies any abdominal pains or changes in his bowel habits prior to this finding. He first noted blood in the stools 4-5 days prior to appearing to the ED.  The pt reports that he has been taking 123m Lovenox injections once each day, except for the days in which he had blood in the stools. He hasn't had blood in his stools for the last 7 days.   He has had GI bleed related to bowel involvement with his lymphoma - this is now resolving and his hgb is stabilizing.  He notes that his scrotum and leg swelling has decreased. He denies having CP or SOB at any point from his PE.   Most recent lab results (04/06/18) of CBC w/diff, CMP, Reticulocytes  is as follows: all values are WNL except for WBC at 15.3k, RBC at 3.65, HGB at 10.5, HCT at 31.4, RDW at 17.8, PLT at 640k, ANC at 12.7k, Monocytes abs at 1.6k, Glucose at 162, Total Protein at 6.2, Albumin at 3.1, Total bilirubin at <0.2, Retic ct pct at 2.5%. Uric acid 04/06/18 was low at 3.0 LDH 04/06/18 elevated at 251  On review of systems, pt reports remaining right leg swelling, blood in the stools 7 days ago, two  bowel movements each day, left leg swelling, improved scrotal swelling, eating well, and denies problems with his port, abdominal pains, constipation, diarrhea, jaw pain, pain along the spine, problems passing urine, and any other symptoms.  Interval History:   Mark SZYMBORSKIwas seen in f/u of his Burkitt's lymphoma. The patient's last visit with [place] on 07/12/2019. The pt reports that he is doing well overall. He is accompanied by his wife [name]  The pt reports he is doing well.  He is still using imodium 3 times a day.  He is receiving pt twice a week.  He is able to walk around with a walker.  He is not taking magnesium supplements due to diarrhea.  He does not have home health care or wound care  Of note since the patient's last visit, pt has had Colectomy Biopsy and Pathology  completed on 08/06/2019 with results revealing "A: Stoma, resection. -Benign full thickness segment of small intestine with mixed inflammation, edema, fibrosis and adjacent granulation tissue (clinically ostomy site) -No dysplasia or malignancy identified -Surgical margins appear histologically viable B: Small bowel and colon, resection -Multiple benign full thickness segments (3) and one benign full thickness fragment of large intestine with chronic colitis with patchy activity, ischemic changes, mucosal erosions, diverticula and serosal adhesions. -No dysplasia or malignancy identified."  Lab results today (10/11/19) of  CBC w/diff and CMP is as follows: all values are WNL except for RBC at 3.38, Hemoglobin at 10.7, HCT at 33.6, RDW at 17.9, magnesium at 0.7, Glucose at 131, ALT at 60, total bilirubin at <0.2.  On review of systems, pt reports improvement in moving around and denies infection issues, fevers, chills, night sweats, concerns about skin around the stoma , leg swelling, abdominal pain and any other symptoms.    MEDICAL HISTORY:  Past Medical History:  Diagnosis Date  . ALLERGIC RHINITIS     . Cancer (HCC)    Lymphoma   . Diabetes mellitus   . Hyperlipidemia     SURGICAL HISTORY: Past Surgical History:  Procedure Laterality Date  . BIOPSY  03/15/2018   Procedure: BIOPSY;  Surgeon: Jacobs, Daniel P, MD;  Location: [facility];  Service: Endoscopy;;  . BIOPSY  03/01/2019   Procedure: BIOPSY;  Surgeon: Stark, Malcolm T, MD;  Location: [facility];  Service: Endoscopy;;  . BOWEL RESECTION N/A 08/25/2018   Procedure: LAPROSCOPIC LOOP ILEOSTOMY;  Surgeon: Wilson, Eric, MD;  Location: [facility];  Service: General;  Laterality: N/A;  . CLEFT PALATE REPAIR    . COLONOSCOPY N/A 03/15/2018   Procedure: COLONOSCOPY;  Surgeon: Jacobs, Daniel P, MD;  Location: [facility];  Service: Endoscopy;  Laterality: N/A;  . COLONOSCOPY N/A 08/20/2018   Procedure: COLONOSCOPY;  Surgeon: Perry, John N, MD;  Location: [facility];  Service: Endoscopy;  Laterality: N/A;  . deviated septum repair     slight improvement  . ESOPHAGOGASTRODUODENOSCOPY N/A 03/15/2018   Procedure: ESOPHAGOGASTRODUODENOSCOPY (EGD);  Surgeon: Jacobs, Daniel P, MD;  Location: [facility];  Service: Endoscopy;  Laterality: N/A;  . FLEXIBLE SIGMOIDOSCOPY N/A 03/01/2019   Procedure: FLEXIBLE SIGMOIDOSCOPY;  Surgeon: Stark, Malcolm T, MD;  Location: [facility];  Service: Endoscopy;  Laterality: N/A;  . ILEOSCOPY N/A 03/01/2019   Procedure: ILEOSCOPY THROUGH STOMA;  Surgeon: Stark, Malcolm T, MD;  Location: [facility];  Service: Endoscopy;  Laterality: N/A;  . IR IMAGING GUIDED PORT INSERTION  03/17/2018  . LAPAROSCOPY N/A 08/25/2018   Procedure: LAPAROSCOPY DIAGNOSTIC;  Surgeon: Wilson, Eric, MD;  Location: [facility];  Service: General;  Laterality: N/A;  . TONSILLECTOMY      SOCIAL HISTORY: Social History   Socioeconomic History  . Marital status: Married    Spouse name: lisa  . Number of children: 0  . Years of education: Not on file  . Highest education level: Not on file  Occupational History  . Not on file  Tobacco  Use  . Smoking status: Never Smoker  . Smokeless tobacco: Never Used  Substance and Sexual Activity  . Alcohol use: Yes    Comment: occasional  . Drug use: No  . Sexual activity: Yes  Other Topics Concern  . Not on file  Social History Narrative   Married 1985. No kids. 4 small dogs.       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 Difficulty of Paying Living Expenses: Not on file  Food Insecurity:   . Worried About Running Out of Food in the Last Year: Not on file  . Ran Out of Food in the Last Year: Not on file  Transportation Needs:   . Lack of Transportation (Medical): Not on file  . Lack of Transportation (Non-Medical): Not on file  Physical Activity:   .   Days of Exercise per Week: Not on file  . Minutes of Exercise per Session: Not on file  Stress:   . Feeling of Stress : Not on file  Social Connections:   . Frequency of Communication with Friends and Family: Not on file  . Frequency of Social Gatherings with Friends and Family: Not on file  . Attends Religious Services: Not on file  . Active Member of Clubs or Organizations: Not on file  . Attends Archivist Meetings: Not on file  . Marital Status: Not on file  Intimate Partner Violence:   . Fear of Current or Ex-Partner: Not on file  . Emotionally Abused: Not on file  . Physically Abused: Not on file  . Sexually Abused: Not on file    FAMILY HISTORY: Family History  Problem Relation Age of Onset  . Lung cancer Mother        smoker  . Brain cancer Mother        metastasis  . AAA (abdominal aortic aneurysm) Father        smoker    ALLERGIES:  is allergic to ciprofloxacin.  MEDICATIONS:  Current Outpatient Medications  Medication Sig Dispense Refill  . B Complex Vitamins (VITAMIN B COMPLEX PO) Take by mouth.    . blood glucose meter kit and supplies Dispense based on  patient and insurance preference. Use daily as directed. (E11.9). 1 each 0  . citalopram (CELEXA) 20 MG tablet Take 1 tablet (20 mg total) by mouth daily. 90 tablet 3  . glucose blood (FREESTYLE TEST STRIPS) test strip Use to check blood sugar daily 100 each 4   No current facility-administered medications for this visit.    REVIEW OF SYSTEMS:   A 10+ POINT REVIEW OF SYSTEMS WAS OBTAINED including neurology, dermatology, psychiatry, cardiac, respiratory, lymph, extremities, GI, GU, Musculoskeletal, constitutional, breasts, reproductive, HEENT.  All pertinent positives are noted in the HPI.  All others are negative.    PHYSICAL EXAMINATION: ECOG FS:2 - Symptomatic, <50% confined to bed  Vitals:   10/11/19 1500  BP: 105/70  Pulse: 100  Resp: 17  Temp: 98.3 F (36.8 C)  SpO2: 100%   Wt Readings from Last 3 Encounters:  10/11/19 172 lb 14.4 oz (78.4 kg)  10/09/19 170 lb (77.1 kg)  07/24/19 199 lb (90.3 kg)   Body mass index is 25.53 kg/m.    GENERAL:alert, in no acute distress and comfortable SKIN: no acute rashes, no significant lesions EYES: conjunctiva are pink and non-injected, sclera anicteric OROPHARYNX: MMM, no exudates, no oropharyngeal erythema or ulceration NECK: supple, no JVD LYMPH:  no palpable lymphadenopathy in the cervical, axillary or inguinal regions LUNGS: clear to auscultation b/l with normal respiratory effort HEART: regular rate & rhythm ABDOMEN:  normoactive bowel sounds , non tender, not distended. Extremity: no pedal edema PSYCH: alert & oriented x 3 with fluent speech NEURO: no focal motor/sensory deficits   LABORATORY DATA:   CBC Latest Ref Rng & Units 10/11/2019 07/12/2019 07/05/2019  WBC 4.0 - 10.5 K/uL 6.4 6.5 1.1(LL)  Hemoglobin 13.0 - 17.0 g/dL 10.7(L) 12.2(L) 9.6(L)  Hematocrit 39.0 - 52.0 % 33.6(L) 37.7(L) 29.8(L)  Platelets 150 - 400 K/uL 207 227 139(L)   ANC 0--->4.8k  CMP Latest Ref Rng & Units 10/11/2019 07/12/2019 07/05/2019    Glucose 70 - 99 mg/dL 131(H) 187(H) 105(H)  BUN 8 - 23 mg/dL _0 Creatinine 0.61 - 1.24 mg/dL 0.88 0.90 0.54(L)  Sodium 135 - 145 mmol/L 140  140 137  Potassium 3.5 - 5.1 mmol/L 3.8 4.0 3.7  Chloride 98 - 111 mmol/L 107 103 106  CO2 22 - 32 mmol/L 23 25 23  Calcium 8.9 - 10.3 mg/dL 8.9 9.4 8.8(L)  Total Protein 6.5 - 8.1 g/dL 7.1 6.6 6.2(L)  Total Bilirubin 0.3 - 1.2 mg/dL <0.2(L) <0.2(L) 0.5  Alkaline Phos 38 - 126 U/L 118 83 54  AST 15 - 41 U/L 36 19 12(L)  ALT 0 - 44 U/L 60(H) 21 19   Lab Results  Component Value Date   LDH 101 10/11/2019    03/01/19 Colonoscopy biopsies:     03/17/18 Cytogenetics:   03/17/18 Colon Bx:   03/13/18 Peritoneum Biopsy:     07/02/2019 PORTABLE CHEST 1 VIEW (Accession [id number])    07/02/2019 CT ABDOMEN AND PELVIS WITH CONTRAST (Accession [id number])  RADIOGRAPHIC STUDIES:  I have personally reviewed the radiological images as listed and agreed with the findings in the report. No results found.  ASSESSMENT & PLAN:   63 y.o. male with  1. 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 03/10/18 CT A/P revealed Large irregular infiltrative solid mass in the right lower quadrant measuring up to 18.7 x 18.5 x 17.8 cm, infiltrating and encasing multiple distal small bowel loops and likely the ileocecal region, partially encasing the sigmoid colon, with prominent extension into the right lower retroperitoneum and extraperitoneal right pelvis with encasement of right external iliac and proximal right common iliac vasculature and infiltration of the right iliopsoas muscle. No BM or CNS involvement.   04/18/18 PET/CT revealed Continued improvement in right colonic wall thickening and surrounding bulky masses consistent with treated lymphoma. No hypermetabolic activity to suggest residual tumor. There is some hypermetabolic activity associated with the sigmoid colon which  demonstrates mild wall thickening, surrounding inflammation and underlying diverticulosis, suggesting mild diverticulitis. Suspected treatment related changes throughout the bone marrow. There is mildly increased activity within the clivus without clear corresponding finding on the CT images. Attention on follow-up recommended. Nonobstructing right renal calculi. 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   06/09/18 PET/CT revealedSoft tissue lesion within the ventral pelvis is again identified demonstrating mild to moderate increased uptake within SUV max of 5.61. Deauville criteria 4. Concerning for residual metabolically active tumor. 2. Similar appearance of diffusely thickened appendix within the right lower quadrant of the abdomen. Findings may reflect treatment related changes. 3. Increased radiotracer uptake throughout the bone marrow which is favored to represent treatment related changes. 4. Lad coronary artery atherosclerotic calcifications.   patient completed planned chemorx on 10/21  10/05/18 CT A/P revealed Persistent wall thickening in the sigmoid colon although a component of this could be due to diverticulosis. There is also a suggestion of wall thickening and potential mucosal enhancement in the terminal ileum, as well as adjacent indistinct stranding in the mesenteric and omental adipose tissues roughly similar to the prior exam. Some of this may be treatment related. Inflammatory process such as Crohn's disease not excluded. 2. No current adenopathy. 3. Other imaging findings of potential clinical significance: Nonobstructive right nephrolithiasis. Aortic Atherosclerosis. Moderate prostatomegaly. Left foraminal impingement at L4-5.  S/p IMRT with 36 Gy over 20 fractions, between 11/09/18 and 12/06/18 with Dr. Squire  05/24/2019 CT CAP revealed "1. Overall little interval change from CT 05/09/2019. Persistent inflamed loop of distal sigmoid colon with submucosal edema. Potential fistulous  communication versus tethering to the ascending colon and a loop of small bowel. Findings are   suggestive of inflammatory bowel disease. 2. No evidence of bowel obstruction. Oral contrast enters the ileostomy bag with bowel obstruction or small bowel dilatation."  2.H/oRLE DVT and b/l PEon anticoagulation.   3. Small bowel obstruction/ileus with sigmoid thickening/stenosis .  CT scan - sigmoid obstructive lesion ? Scar tissue from resolved lymphoma vs residual lymphoma vs post inflammatory scarring from diverticulitis/limited perforation. Also has SBO from adhesions. Colonoscopy 08/21/2018 - no intramucosal lesions. Extrinsic compression of sigmoid colon. 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 Eric Wilson  4.  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  GI panel and C. difficile negative.  CMV IgM negative, EBV IgM negative. 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 Recurrent abscesses and sepsis. Now s/p colectomy  --surgical pathology with no residual lymphoma.  5. Short gut syndrome with high stool output.  6. Severe hypomagnesemia Mg 0.7   6. S/p  Pancytopenia  PLAN: -Discussed pt labwork today, 10/11/19;  all values are WNL except for RBC at 3.38, Hemoglobin at 10.7, HCT at 33.6, RDW at 17.9, magnesium at 0.7, Glucose at 131, ALT at 60, total bilirubin at <0.2. -Discussed Colectomy Biopsy and Pathology  completed on 08/06/2019 with results revealing "A: Stoma, resection. -Benign full thickness segment of small intestine with mixed inflammation, edema, fibrosis and adjacent granulation tissue (clinically ostomy site) -No dysplasia or malignancy identified -Surgical margins appear histologically viable B: Small bowel and colon, resection -Multiple benign full thickness segments (3) and one benign full thickness fragment of large intestine with chronic colitis with patchy activity, ischemic changes,  mucosal erosions, diverticula and serosal adhesions. -No dysplasia or malignancy identified -Discussed replacing magnesium. Has had significant diarrhea with previous po magnesium replacement.  -Will set up IV magnesium replacement in clinic.  -Will get 4 grams of magnesium today. -Will see back in 3 months  FOLLOW UP: Portflush, Labs and IV magnesium on 2/1 and 2/5 RTC with Dr  in 3 months with portflush and labs  The total time spent in the appt was 35 minutes and more than 50% was on counseling and direct patient cares.  All of the patient's questions were answered with apparent satisfaction. The patient knows to call the clinic with any problems, questions or concerns.      MD MS AAHIVMS [facility] C[facility] Hematology/Oncology Physician [facility]  (Office):       [phone] (Work cell):  [phone] (Fax):           [phone] I, Aara McKoy, am acting as a scribe for Dr.  .   .I have reviewed the above documentation for accuracy and completeness, and I agree with the above. . Kishore  MD       

## 2019-10-11 ENCOUNTER — Inpatient Hospital Stay: Payer: 59

## 2019-10-11 ENCOUNTER — Other Ambulatory Visit: Payer: Self-pay

## 2019-10-11 ENCOUNTER — Inpatient Hospital Stay: Payer: 59 | Attending: Hematology

## 2019-10-11 ENCOUNTER — Inpatient Hospital Stay (HOSPITAL_BASED_OUTPATIENT_CLINIC_OR_DEPARTMENT_OTHER): Payer: 59 | Admitting: Hematology

## 2019-10-11 VITALS — BP 105/70 | HR 100 | Temp 98.3°F | Resp 17 | Ht 69.0 in | Wt 172.9 lb

## 2019-10-11 DIAGNOSIS — Z7189 Other specified counseling: Secondary | ICD-10-CM

## 2019-10-11 DIAGNOSIS — Z95828 Presence of other vascular implants and grafts: Secondary | ICD-10-CM

## 2019-10-11 DIAGNOSIS — Z86718 Personal history of other venous thrombosis and embolism: Secondary | ICD-10-CM | POA: Insufficient documentation

## 2019-10-11 DIAGNOSIS — Z79899 Other long term (current) drug therapy: Secondary | ICD-10-CM | POA: Insufficient documentation

## 2019-10-11 DIAGNOSIS — C8510 Unspecified B-cell lymphoma, unspecified site: Secondary | ICD-10-CM

## 2019-10-11 DIAGNOSIS — Z86711 Personal history of pulmonary embolism: Secondary | ICD-10-CM | POA: Diagnosis not present

## 2019-10-11 DIAGNOSIS — C8378 Burkitt lymphoma, lymph nodes of multiple sites: Secondary | ICD-10-CM | POA: Insufficient documentation

## 2019-10-11 DIAGNOSIS — Z7901 Long term (current) use of anticoagulants: Secondary | ICD-10-CM | POA: Insufficient documentation

## 2019-10-11 LAB — CBC WITH DIFFERENTIAL/PLATELET
Abs Immature Granulocytes: 0.02 10*3/uL (ref 0.00–0.07)
Basophils Absolute: 0 10*3/uL (ref 0.0–0.1)
Basophils Relative: 1 %
Eosinophils Absolute: 0.1 10*3/uL (ref 0.0–0.5)
Eosinophils Relative: 1 %
HCT: 33.6 % — ABNORMAL LOW (ref 39.0–52.0)
Hemoglobin: 10.7 g/dL — ABNORMAL LOW (ref 13.0–17.0)
Immature Granulocytes: 0 %
Lymphocytes Relative: 21 %
Lymphs Abs: 1.4 10*3/uL (ref 0.7–4.0)
MCH: 31.7 pg (ref 26.0–34.0)
MCHC: 31.8 g/dL (ref 30.0–36.0)
MCV: 99.4 fL (ref 80.0–100.0)
Monocytes Absolute: 0.6 10*3/uL (ref 0.1–1.0)
Monocytes Relative: 10 %
Neutro Abs: 4.3 10*3/uL (ref 1.7–7.7)
Neutrophils Relative %: 67 %
Platelets: 207 10*3/uL (ref 150–400)
RBC: 3.38 MIL/uL — ABNORMAL LOW (ref 4.22–5.81)
RDW: 17.9 % — ABNORMAL HIGH (ref 11.5–15.5)
WBC: 6.4 10*3/uL (ref 4.0–10.5)
nRBC: 0 % (ref 0.0–0.2)

## 2019-10-11 LAB — CMP (CANCER CENTER ONLY)
ALT: 60 U/L — ABNORMAL HIGH (ref 0–44)
AST: 36 U/L (ref 15–41)
Albumin: 3.8 g/dL (ref 3.5–5.0)
Alkaline Phosphatase: 118 U/L (ref 38–126)
Anion gap: 10 (ref 5–15)
BUN: 20 mg/dL (ref 8–23)
CO2: 23 mmol/L (ref 22–32)
Calcium: 8.9 mg/dL (ref 8.9–10.3)
Chloride: 107 mmol/L (ref 98–111)
Creatinine: 0.88 mg/dL (ref 0.61–1.24)
GFR, Est AFR Am: >60 mL/min (ref >60)
GFR, Estimated: >60 mL/min (ref >60)
Glucose, Bld: 131 mg/dL — ABNORMAL HIGH (ref 70–99)
Potassium: 3.8 mmol/L (ref 3.5–5.1)
Sodium: 140 mmol/L (ref 135–145)
Total Bilirubin: <0.2 mg/dL — ABNORMAL LOW (ref 0.3–1.2)
Total Protein: 7.1 g/dL (ref 6.5–8.1)

## 2019-10-11 LAB — LACTATE DEHYDROGENASE: LDH: 101 U/L (ref 98–192)

## 2019-10-11 LAB — PHOSPHORUS: Phosphorus: 2.7 mg/dL (ref 2.5–4.6)

## 2019-10-11 LAB — MAGNESIUM: Magnesium: 0.7 mg/dL — CL (ref 1.7–2.4)

## 2019-10-11 MED ORDER — HEPARIN SOD (PORK) LOCK FLUSH 100 UNIT/ML IV SOLN
500.0000 [IU] | Freq: Once | INTRAVENOUS | Status: AC
  Administered 2019-10-11: 500 [IU]
  Filled 2019-10-11: qty 5

## 2019-10-11 MED ORDER — SODIUM CHLORIDE 0.9% FLUSH
10.0000 mL | Freq: Once | INTRAVENOUS | Status: AC
  Administered 2019-10-11: 10 mL
  Filled 2019-10-11: qty 10

## 2019-10-11 MED ORDER — MAGNESIUM SULFATE 4 GM/100ML IV SOLN
4.0000 g | Freq: Once | INTRAVENOUS | Status: AC
  Administered 2019-10-11: 4 g via INTRAVENOUS
  Filled 2019-10-11: qty 100

## 2019-10-11 MED ORDER — SODIUM CHLORIDE 0.9 % IV SOLN
INTRAVENOUS | Status: DC
  Filled 2019-10-11: qty 250

## 2019-10-11 NOTE — Patient Instructions (Signed)
Hypomagnesemia Hypomagnesemia is a condition in which the level of magnesium in the blood is low. Magnesium is a mineral that is found in many foods. It is used in many different processes in the body. Hypomagnesemia can affect every organ in the body. In severe cases, it can cause life-threatening problems. What are the causes? This condition may be caused by:  Not getting enough magnesium in your diet.  Malnutrition.  Problems with absorbing magnesium from the intestines.  Dehydration.  Alcohol abuse.  Vomiting.  Severe or chronic diarrhea.  Some medicines, including medicines that make you urinate more (diuretics).  Certain diseases, such as kidney disease, diabetes, celiac disease, and overactive thyroid. What are the signs or symptoms? Symptoms of this condition include:  Loss of appetite.  Nausea and vomiting.  Involuntary shaking or trembling of a body part (tremor).  Muscle weakness.  Tingling in the arms and legs.  Sudden tightening of muscles (muscle spasms).  Confusion.  Psychiatric issues, such as depression, irritability, or psychosis.  A feeling of fluttering of the heart.  Seizures. These symptoms are more severe if magnesium levels drop suddenly. How is this diagnosed? This condition may be diagnosed based on:  Your symptoms and medical history.  A physical exam.  Blood and urine tests. How is this treated? Treatment depends on the cause and the severity of the condition. It may be treated with:  A magnesium supplement. This can be taken in pill form. If the condition is severe, magnesium is usually given through an IV.  Changes to your diet. You may be directed to eat foods that have a lot of magnesium, such as green leafy vegetables, peas, beans, and nuts.  Stopping any intake of alcohol. Follow these instructions at home:      Make sure that your diet includes foods with magnesium. Foods that have a lot of magnesium in them  include: ? Green leafy vegetables, such as spinach and broccoli. ? Beans and peas. ? Nuts and seeds, such as almonds and sunflower seeds. ? Whole grains, such as whole grain bread and fortified cereals.  Take magnesium supplements if your health care provider tells you to do that. Take them as directed.  Take over-the-counter and prescription medicines only as told by your health care provider.  Have your magnesium levels monitored as told by your health care provider.  When you are active, drink fluids that contain electrolytes.  Avoid drinking alcohol.  Keep all follow-up visits as told by your health care provider. This is important. Contact a health care provider if:  You get worse instead of better.  Your symptoms return. Get help right away if you:  Develop severe muscle weakness.  Have trouble breathing.  Feel that your heart is racing. Summary  Hypomagnesemia is a condition in which the level of magnesium in the blood is low.  Hypomagnesemia can affect every organ in the body.  Treatment may include eating more foods that contain magnesium, taking magnesium supplements, and not drinking alcohol.  Have your magnesium levels monitored as told by your health care provider. This information is not intended to replace advice given to you by your health care provider. Make sure you discuss any questions you have with your health care provider. Document Revised: 08/12/2017 Document Reviewed: 08/01/2017 Elsevier Patient Education  2020 [employer].

## 2019-10-11 NOTE — Progress Notes (Signed)
Received critical value of magnesium 0.7 and reported it to [name].  Appt set up for Mag infusion for tomorrow 10/12/19.  Gardiner Rhyme, RN

## 2019-10-12 ENCOUNTER — Other Ambulatory Visit: Payer: Self-pay | Admitting: *Deleted

## 2019-10-12 DIAGNOSIS — C8378 Burkitt lymphoma, lymph nodes of multiple sites: Secondary | ICD-10-CM

## 2019-10-15 ENCOUNTER — Inpatient Hospital Stay: Payer: 59

## 2019-10-15 ENCOUNTER — Other Ambulatory Visit: Payer: Self-pay | Admitting: Hematology

## 2019-10-15 ENCOUNTER — Telehealth: Payer: Self-pay | Admitting: *Deleted

## 2019-10-15 ENCOUNTER — Inpatient Hospital Stay: Payer: 59 | Attending: Hematology

## 2019-10-15 ENCOUNTER — Other Ambulatory Visit: Payer: Self-pay

## 2019-10-15 VITALS — BP 106/70 | HR 100 | Temp 97.9°F | Resp 18

## 2019-10-15 DIAGNOSIS — C8378 Burkitt lymphoma, lymph nodes of multiple sites: Secondary | ICD-10-CM | POA: Insufficient documentation

## 2019-10-15 DIAGNOSIS — Z7189 Other specified counseling: Secondary | ICD-10-CM

## 2019-10-15 DIAGNOSIS — Z95828 Presence of other vascular implants and grafts: Secondary | ICD-10-CM

## 2019-10-15 DIAGNOSIS — C8510 Unspecified B-cell lymphoma, unspecified site: Secondary | ICD-10-CM

## 2019-10-15 LAB — MAGNESIUM: Magnesium: 1 mg/dL — CL (ref 1.7–2.4)

## 2019-10-15 LAB — BASIC METABOLIC PANEL - CANCER CENTER ONLY
Anion gap: 8 (ref 5–15)
BUN: 9 mg/dL (ref 8–23)
CO2: 24 mmol/L (ref 22–32)
Calcium: 9.2 mg/dL (ref 8.9–10.3)
Chloride: 108 mmol/L (ref 98–111)
Creatinine: 0.77 mg/dL (ref 0.61–1.24)
GFR, Est AFR Am: >60 mL/min (ref >60)
GFR, Estimated: >60 mL/min (ref >60)
Glucose, Bld: 89 mg/dL (ref 70–99)
Potassium: 4.2 mmol/L (ref 3.5–5.1)
Sodium: 140 mmol/L (ref 135–145)

## 2019-10-15 LAB — PHOSPHORUS: Phosphorus: 3.3 mg/dL (ref 2.5–4.6)

## 2019-10-15 MED ORDER — SODIUM CHLORIDE 0.9% FLUSH
10.0000 mL | Freq: Once | INTRAVENOUS | Status: AC
  Administered 2019-10-15: 16:00:00 10 mL via INTRAVENOUS
  Filled 2019-10-15: qty 10

## 2019-10-15 MED ORDER — MAGNESIUM SULFATE 4 GM/100ML IV SOLN
4.0000 g | Freq: Once | INTRAVENOUS | Status: AC
  Administered 2019-10-15: 4 g via INTRAVENOUS
  Filled 2019-10-15: qty 100

## 2019-10-15 MED ORDER — MAGNESIUM SULFATE 4 GM/100ML IV SOLN
4.0000 g | Freq: Once | INTRAVENOUS | Status: DC
  Filled 2019-10-15: qty 100

## 2019-10-15 MED ORDER — HEPARIN SOD (PORK) LOCK FLUSH 100 UNIT/ML IV SOLN
500.0000 [IU] | Freq: Once | INTRAVENOUS | Status: AC
  Administered 2019-10-15: 500 [IU] via INTRAVENOUS
  Filled 2019-10-15: qty 5

## 2019-10-15 MED ORDER — SODIUM CHLORIDE 0.9% FLUSH
10.0000 mL | Freq: Once | INTRAVENOUS | Status: AC
  Administered 2019-10-15: 13:00:00 10 mL
  Filled 2019-10-15: qty 10

## 2019-10-15 MED ORDER — SODIUM CHLORIDE 0.9 % IV SOLN
INTRAVENOUS | Status: DC
  Filled 2019-10-15: qty 250

## 2019-10-15 NOTE — Patient Instructions (Signed)
Magnesium Sulfate injection What is this medicine? MAGNESIUM SULFATE (mag NEE zee um SUL fate) is an electrolyte injection commonly used to treat low magnesium levels in your blood. It is also used to prevent or control seizures in women with preeclampsia or eclampsia. This medicine may be used for other purposes; ask your health care provider or pharmacist if you have questions. What should I tell my health care provider before I take this medicine? They need to know if you have any of these conditions:  heart disease  history of irregular heart beat  kidney disease  an unusual or allergic reaction to magnesium sulfate, medicines, foods, dyes, or preservatives  pregnant or trying to get pregnant  breast-feeding How should I use this medicine? This medicine is for infusion into a vein. It is given by a health care professional in a hospital or clinic setting. Talk to your pediatrician regarding the use of this medicine in children. While this drug may be prescribed for selected conditions, precautions do apply. Overdosage: If you think you have taken too much of this medicine contact a poison control center or emergency room at once. NOTE: This medicine is only for you. Do not share this medicine with others. What if I miss a dose? This does not apply. What may interact with this medicine? This medicine may interact with the following medications:  certain medicines for anxiety or sleep  certain medicines for seizures like phenobarbital  digoxin  medicines that relax muscles for surgery  narcotic medicines for pain This list may not describe all possible interactions. Give your health care provider a list of all the medicines, herbs, non-prescription drugs, or dietary supplements you use. Also tell them if you smoke, drink alcohol, or use illegal drugs. Some items may interact with your medicine. What should I watch for while using this medicine? Your condition will be  monitored carefully while you are receiving this medicine. You may need blood work done while you are receiving this medicine. What side effects may I notice from receiving this medicine? Side effects that you should report to your doctor or health care professional as soon as possible:  allergic reactions like skin rash, itching or hives, swelling of the face, lips, or tongue  facial flushing  muscle weakness  signs and symptoms of low blood pressure like dizziness; feeling faint or lightheaded, falls; unusually weak or tired  signs and symptoms of a dangerous change in heartbeat or heart rhythm like chest pain; dizziness; fast or irregular heartbeat; palpitations; breathing problems  sweating This list may not describe all possible side effects. Call your doctor for medical advice about side effects. You may report side effects to FDA at 1-800-FDA-1088. Where should I keep my medicine? This drug is given in a hospital or clinic and will not be stored at home. NOTE: This sheet is a summary. It may not cover all possible information. If you have questions about this medicine, talk to your doctor, pharmacist, or health care provider.  2020 Elsevier/Gold Standard (2016-03-17 12:31:42)

## 2019-10-15 NOTE — Telephone Encounter (Signed)
1345: Jay/lab called: Mg 1.0. [name] informed [phone]

## 2019-10-18 ENCOUNTER — Other Ambulatory Visit: Payer: Self-pay | Admitting: *Deleted

## 2019-10-18 DIAGNOSIS — C8378 Burkitt lymphoma, lymph nodes of multiple sites: Secondary | ICD-10-CM

## 2019-10-19 ENCOUNTER — Other Ambulatory Visit: Payer: Self-pay | Admitting: Hematology

## 2019-10-19 ENCOUNTER — Inpatient Hospital Stay: Payer: 59

## 2019-10-19 ENCOUNTER — Telehealth: Payer: Self-pay | Admitting: *Deleted

## 2019-10-19 ENCOUNTER — Other Ambulatory Visit: Payer: Self-pay

## 2019-10-19 DIAGNOSIS — C8510 Unspecified B-cell lymphoma, unspecified site: Secondary | ICD-10-CM

## 2019-10-19 DIAGNOSIS — Z95828 Presence of other vascular implants and grafts: Secondary | ICD-10-CM

## 2019-10-19 DIAGNOSIS — Z7189 Other specified counseling: Secondary | ICD-10-CM

## 2019-10-19 DIAGNOSIS — C8378 Burkitt lymphoma, lymph nodes of multiple sites: Secondary | ICD-10-CM

## 2019-10-19 LAB — CBC WITH DIFFERENTIAL (CANCER CENTER ONLY)
Abs Immature Granulocytes: 0.01 10*3/uL (ref 0.00–0.07)
Basophils Absolute: 0 10*3/uL (ref 0.0–0.1)
Basophils Relative: 0 %
Eosinophils Absolute: 0 10*3/uL (ref 0.0–0.5)
Eosinophils Relative: 1 %
HCT: 32.6 % — ABNORMAL LOW (ref 39.0–52.0)
Hemoglobin: 10.2 g/dL — ABNORMAL LOW (ref 13.0–17.0)
Immature Granulocytes: 0 %
Lymphocytes Relative: 22 %
Lymphs Abs: 1 10*3/uL (ref 0.7–4.0)
MCH: 31.6 pg (ref 26.0–34.0)
MCHC: 31.3 g/dL (ref 30.0–36.0)
MCV: 100.9 fL — ABNORMAL HIGH (ref 80.0–100.0)
Monocytes Absolute: 0.5 10*3/uL (ref 0.1–1.0)
Monocytes Relative: 10 %
Neutro Abs: 3.1 10*3/uL (ref 1.7–7.7)
Neutrophils Relative %: 67 %
Platelet Count: 185 10*3/uL (ref 150–400)
RBC: 3.23 MIL/uL — ABNORMAL LOW (ref 4.22–5.81)
RDW: 17.9 % — ABNORMAL HIGH (ref 11.5–15.5)
WBC Count: 4.6 10*3/uL (ref 4.0–10.5)
nRBC: 0 % (ref 0.0–0.2)

## 2019-10-19 LAB — CMP (CANCER CENTER ONLY)
ALT: 29 U/L (ref 0–44)
AST: 19 U/L (ref 15–41)
Albumin: 3.5 g/dL (ref 3.5–5.0)
Alkaline Phosphatase: 98 U/L (ref 38–126)
Anion gap: 7 (ref 5–15)
BUN: 10 mg/dL (ref 8–23)
CO2: 27 mmol/L (ref 22–32)
Calcium: 9.3 mg/dL (ref 8.9–10.3)
Chloride: 105 mmol/L (ref 98–111)
Creatinine: 0.75 mg/dL (ref 0.61–1.24)
GFR, Est AFR Am: >60 mL/min (ref >60)
GFR, Estimated: >60 mL/min (ref >60)
Glucose, Bld: 86 mg/dL (ref 70–99)
Potassium: 4 mmol/L (ref 3.5–5.1)
Sodium: 139 mmol/L (ref 135–145)
Total Bilirubin: <0.2 mg/dL — ABNORMAL LOW (ref 0.3–1.2)
Total Protein: 6.9 g/dL (ref 6.5–8.1)

## 2019-10-19 LAB — MAGNESIUM: Magnesium: 1.1 mg/dL — CL (ref 1.7–2.4)

## 2019-10-19 MED ORDER — HEPARIN SOD (PORK) LOCK FLUSH 100 UNIT/ML IV SOLN
500.0000 [IU] | Freq: Once | INTRAVENOUS | Status: DC
  Filled 2019-10-19: qty 5

## 2019-10-19 MED ORDER — MAGNESIUM SULFATE 4 GM/100ML IV SOLN
4.0000 g | Freq: Once | INTRAVENOUS | Status: AC
  Administered 2019-10-19: 14:00:00 4 g via INTRAVENOUS
  Filled 2019-10-19: qty 100

## 2019-10-19 MED ORDER — SODIUM CHLORIDE 0.9% FLUSH
10.0000 mL | INTRAVENOUS | Status: DC | PRN
  Administered 2019-10-19: 10 mL via INTRAVENOUS
  Filled 2019-10-19: qty 10

## 2019-10-19 MED ORDER — SODIUM CHLORIDE 0.9 % IV SOLN
Freq: Once | INTRAVENOUS | Status: AC
  Filled 2019-10-19: qty 250

## 2019-10-19 MED ORDER — SODIUM CHLORIDE 0.9% FLUSH
10.0000 mL | Freq: Once | INTRAVENOUS | Status: AC
  Administered 2019-10-19: 10 mL
  Filled 2019-10-19: qty 10

## 2019-10-19 NOTE — Patient Instructions (Signed)
Magnesium Sulfate injection What is this medicine? MAGNESIUM SULFATE (mag NEE zee um SUL fate) is an electrolyte injection commonly used to treat low magnesium levels in your blood. It is also used to prevent or control seizures in women with preeclampsia or eclampsia. This medicine may be used for other purposes; ask your health care provider or pharmacist if you have questions. What should I tell my health care provider before I take this medicine? They need to know if you have any of these conditions:  heart disease  history of irregular heart beat  kidney disease  an unusual or allergic reaction to magnesium sulfate, medicines, foods, dyes, or preservatives  pregnant or trying to get pregnant  breast-feeding How should I use this medicine? This medicine is for infusion into a vein. It is given by a health care professional in a hospital or clinic setting. Talk to your pediatrician regarding the use of this medicine in children. While this drug may be prescribed for selected conditions, precautions do apply. Overdosage: If you think you have taken too much of this medicine contact a poison control center or emergency room at once. NOTE: This medicine is only for you. Do not share this medicine with others. What if I miss a dose? This does not apply. What may interact with this medicine? This medicine may interact with the following medications:  certain medicines for anxiety or sleep  certain medicines for seizures like phenobarbital  digoxin  medicines that relax muscles for surgery  narcotic medicines for pain This list may not describe all possible interactions. Give your health care provider a list of all the medicines, herbs, non-prescription drugs, or dietary supplements you use. Also tell them if you smoke, drink alcohol, or use illegal drugs. Some items may interact with your medicine. What should I watch for while using this medicine? Your condition will be  monitored carefully while you are receiving this medicine. You may need blood work done while you are receiving this medicine. What side effects may I notice from receiving this medicine? Side effects that you should report to your doctor or health care professional as soon as possible:  allergic reactions like skin rash, itching or hives, swelling of the face, lips, or tongue  facial flushing  muscle weakness  signs and symptoms of low blood pressure like dizziness; feeling faint or lightheaded, falls; unusually weak or tired  signs and symptoms of a dangerous change in heartbeat or heart rhythm like chest pain; dizziness; fast or irregular heartbeat; palpitations; breathing problems  sweating This list may not describe all possible side effects. Call your doctor for medical advice about side effects. You may report side effects to FDA at 1-800-FDA-1088. Where should I keep my medicine? This drug is given in a hospital or clinic and will not be stored at home. NOTE: This sheet is a summary. It may not cover all possible information. If you have questions about this medicine, talk to your doctor, pharmacist, or health care provider.  2020 Elsevier/Gold Standard (2016-03-17 12:31:42)

## 2019-10-19 NOTE — Patient Instructions (Signed)

## 2019-10-19 NOTE — Telephone Encounter (Signed)
51 Ronda/lab notified [name] office - Magnesium 1.1. [name] informed. Verbal order received for Magnesium, 4gms IV today. Pharmacist GLOVF notified/given order.

## 2019-12-03 ENCOUNTER — Observation Stay (HOSPITAL_COMMUNITY)
Admission: EM | Admit: 2019-12-03 | Discharge: 2019-12-04 | Disposition: A | Discharge status: Home / Self Care | Payer: 59 | Attending: Surgery | Admitting: Surgery

## 2019-12-03 ENCOUNTER — Emergency Department (HOSPITAL_COMMUNITY): Payer: 59

## 2019-12-03 ENCOUNTER — Encounter (HOSPITAL_COMMUNITY): Payer: Self-pay

## 2019-12-03 ENCOUNTER — Other Ambulatory Visit: Payer: Self-pay

## 2019-12-03 DIAGNOSIS — Z86718 Personal history of other venous thrombosis and embolism: Secondary | ICD-10-CM | POA: Diagnosis not present

## 2019-12-03 DIAGNOSIS — Z9049 Acquired absence of other specified parts of digestive tract: Secondary | ICD-10-CM | POA: Diagnosis not present

## 2019-12-03 DIAGNOSIS — Z86711 Personal history of pulmonary embolism: Secondary | ICD-10-CM | POA: Diagnosis not present

## 2019-12-03 DIAGNOSIS — K651 Peritoneal abscess: Secondary | ICD-10-CM | POA: Diagnosis present

## 2019-12-03 DIAGNOSIS — M549 Dorsalgia, unspecified: Secondary | ICD-10-CM | POA: Insufficient documentation

## 2019-12-03 DIAGNOSIS — Z923 Personal history of irradiation: Secondary | ICD-10-CM | POA: Insufficient documentation

## 2019-12-03 DIAGNOSIS — K6811 Postprocedural retroperitoneal abscess: Principal | ICD-10-CM | POA: Insufficient documentation

## 2019-12-03 DIAGNOSIS — L02211 Cutaneous abscess of abdominal wall: Secondary | ICD-10-CM | POA: Diagnosis not present

## 2019-12-03 DIAGNOSIS — T8143XA Infection following a procedure, organ and space surgical site, initial encounter: Secondary | ICD-10-CM | POA: Diagnosis not present

## 2019-12-03 DIAGNOSIS — Y838 Other surgical procedures as the cause of abnormal reaction of the patient, or of later complication, without mention of misadventure at the time of the procedure: Secondary | ICD-10-CM | POA: Diagnosis not present

## 2019-12-03 DIAGNOSIS — K589 Irritable bowel syndrome without diarrhea: Secondary | ICD-10-CM | POA: Insufficient documentation

## 2019-12-03 DIAGNOSIS — Z881 Allergy status to other antibiotic agents status: Secondary | ICD-10-CM | POA: Diagnosis not present

## 2019-12-03 DIAGNOSIS — L0291 Cutaneous abscess, unspecified: Secondary | ICD-10-CM

## 2019-12-03 DIAGNOSIS — Z932 Ileostomy status: Secondary | ICD-10-CM | POA: Diagnosis not present

## 2019-12-03 DIAGNOSIS — E119 Type 2 diabetes mellitus without complications: Secondary | ICD-10-CM | POA: Diagnosis not present

## 2019-12-03 DIAGNOSIS — D61818 Other pancytopenia: Secondary | ICD-10-CM | POA: Diagnosis not present

## 2019-12-03 DIAGNOSIS — Z933 Colostomy status: Secondary | ICD-10-CM | POA: Insufficient documentation

## 2019-12-03 DIAGNOSIS — Z20822 Contact with and (suspected) exposure to covid-19: Secondary | ICD-10-CM | POA: Diagnosis not present

## 2019-12-03 DIAGNOSIS — R000 Tachycardia, unspecified: Secondary | ICD-10-CM | POA: Insufficient documentation

## 2019-12-03 DIAGNOSIS — C8370 Burkitt lymphoma, unspecified site: Secondary | ICD-10-CM | POA: Diagnosis not present

## 2019-12-03 LAB — GLUCOSE, CAPILLARY
Glucose-Capillary: 105 mg/dL — ABNORMAL HIGH (ref 70–99)
Glucose-Capillary: 78 mg/dL (ref 70–99)
Glucose-Capillary: 89 mg/dL (ref 70–99)
Glucose-Capillary: 90 mg/dL (ref 70–99)

## 2019-12-03 LAB — APTT: aPTT: 31 seconds (ref 24–36)

## 2019-12-03 LAB — CBC WITH DIFFERENTIAL/PLATELET
Abs Immature Granulocytes: 0.07 10*3/uL (ref 0.00–0.07)
Basophils Absolute: 0 10*3/uL (ref 0.0–0.1)
Basophils Relative: 0 %
Eosinophils Absolute: 0 10*3/uL (ref 0.0–0.5)
Eosinophils Relative: 0 %
HCT: 29 % — ABNORMAL LOW (ref 39.0–52.0)
Hemoglobin: 9 g/dL — ABNORMAL LOW (ref 13.0–17.0)
Immature Granulocytes: 1 %
Lymphocytes Relative: 7 %
Lymphs Abs: 0.8 10*3/uL (ref 0.7–4.0)
MCH: 31.6 pg (ref 26.0–34.0)
MCHC: 31 g/dL (ref 30.0–36.0)
MCV: 101.8 fL — ABNORMAL HIGH (ref 80.0–100.0)
Monocytes Absolute: 1 10*3/uL (ref 0.1–1.0)
Monocytes Relative: 9 %
Neutro Abs: 9.7 10*3/uL — ABNORMAL HIGH (ref 1.7–7.7)
Neutrophils Relative %: 83 %
Platelets: 233 10*3/uL (ref 150–400)
RBC: 2.85 MIL/uL — ABNORMAL LOW (ref 4.22–5.81)
RDW: 15.9 % — ABNORMAL HIGH (ref 11.5–15.5)
WBC: 11.6 10*3/uL — ABNORMAL HIGH (ref 4.0–10.5)
nRBC: 0 % (ref 0.0–0.2)

## 2019-12-03 LAB — I-STAT CREATININE, ED: Creatinine, Ser: 0.7 mg/dL (ref 0.61–1.24)

## 2019-12-03 LAB — COMPREHENSIVE METABOLIC PANEL
ALT: 43 U/L (ref 0–44)
AST: 36 U/L (ref 15–41)
Albumin: 3.1 g/dL — ABNORMAL LOW (ref 3.5–5.0)
Alkaline Phosphatase: 95 U/L (ref 38–126)
Anion gap: 10 (ref 5–15)
BUN: 11 mg/dL (ref 8–23)
CO2: 28 mmol/L (ref 22–32)
Calcium: 8.7 mg/dL — ABNORMAL LOW (ref 8.9–10.3)
Chloride: 96 mmol/L — ABNORMAL LOW (ref 98–111)
Creatinine, Ser: 0.74 mg/dL (ref 0.61–1.24)
GFR calc Af Amer: >60 mL/min (ref >60)
GFR calc non Af Amer: >60 mL/min (ref >60)
Glucose, Bld: 123 mg/dL — ABNORMAL HIGH (ref 70–99)
Potassium: 3.4 mmol/L — ABNORMAL LOW (ref 3.5–5.1)
Sodium: 134 mmol/L — ABNORMAL LOW (ref 135–145)
Total Bilirubin: 0.6 mg/dL (ref 0.3–1.2)
Total Protein: 7 g/dL (ref 6.5–8.1)

## 2019-12-03 LAB — SARS CORONAVIRUS 2 (TAT 6-24 HRS): SARS Coronavirus 2: NEGATIVE

## 2019-12-03 LAB — PROTIME-INR
INR: 1.2 (ref 0.8–1.2)
Prothrombin Time: 14.6 seconds (ref 11.4–15.2)

## 2019-12-03 LAB — LACTIC ACID, PLASMA: Lactic Acid, Venous: 0.7 mmol/L (ref 0.5–1.9)

## 2019-12-03 MED ORDER — SODIUM CHLORIDE 0.9 % IV SOLN
2.0000 g | INTRAVENOUS | Status: DC
  Administered 2019-12-03 – 2019-12-04 (×2): 2 g via INTRAVENOUS
  Filled 2019-12-03: qty 20
  Filled 2019-12-03: qty 2

## 2019-12-03 MED ORDER — ACETAMINOPHEN 325 MG PO TABS: 650.0000 mg | ORAL_TABLET | Freq: Four times a day (QID) | ORAL | Status: DC | PRN

## 2019-12-03 MED ORDER — CITALOPRAM HYDROBROMIDE 20 MG PO TABS
20.0000 mg | ORAL_TABLET | Freq: Every day | ORAL | Status: DC
  Administered 2019-12-03 – 2019-12-04 (×2): 20 mg via ORAL
  Filled 2019-12-03 (×2): qty 1

## 2019-12-03 MED ORDER — KCL IN DEXTROSE-NACL 20-5-0.45 MEQ/L-%-% IV SOLN
INTRAVENOUS | Status: DC
  Filled 2019-12-03 (×2): qty 1000

## 2019-12-03 MED ORDER — CHLORHEXIDINE GLUCONATE CLOTH 2 % EX PADS
6.0000 | MEDICATED_PAD | Freq: Every day | CUTANEOUS | Status: DC
  Administered 2019-12-04: 6 via TOPICAL

## 2019-12-03 MED ORDER — LIDOCAINE HCL 2 % IJ SOLN
20.0000 mL | Freq: Once | INTRAMUSCULAR | Status: DC
  Filled 2019-12-03: qty 20

## 2019-12-03 MED ORDER — ACETAMINOPHEN 650 MG RE SUPP: 650.0000 mg | Freq: Four times a day (QID) | RECTAL | Status: DC | PRN

## 2019-12-03 MED ORDER — HYDROCODONE-ACETAMINOPHEN 5-325 MG PO TABS
1.0000 | ORAL_TABLET | ORAL | Status: DC | PRN
  Filled 2019-12-03: qty 1

## 2019-12-03 MED ORDER — MAGNESIUM 100 MG PO TABS: 150.0000 mg | ORAL_TABLET | Freq: Three times a day (TID) | ORAL | Status: DC

## 2019-12-03 MED ORDER — SODIUM CHLORIDE (PF) 0.9 % IJ SOLN
INTRAMUSCULAR | Status: AC
  Filled 2019-12-03: qty 50

## 2019-12-03 MED ORDER — ENOXAPARIN SODIUM 40 MG/0.4ML ~~LOC~~ SOLN
40.0000 mg | SUBCUTANEOUS | Status: DC
  Administered 2019-12-03: 40 mg via SUBCUTANEOUS
  Filled 2019-12-03: qty 0.4

## 2019-12-03 MED ORDER — DIPHENHYDRAMINE HCL 12.5 MG/5ML PO ELIX: 12.5000 mg | ORAL_SOLUTION | Freq: Four times a day (QID) | ORAL | Status: DC | PRN

## 2019-12-03 MED ORDER — LOPERAMIDE HCL 2 MG PO CAPS: 2.0000 mg | ORAL_CAPSULE | ORAL | Status: DC | PRN

## 2019-12-03 MED ORDER — ONDANSETRON HCL 4 MG/2ML IJ SOLN: 4.0000 mg | Freq: Four times a day (QID) | INTRAMUSCULAR | Status: DC | PRN

## 2019-12-03 MED ORDER — IOHEXOL 300 MG/ML  SOLN
100.0000 mL | Freq: Once | INTRAMUSCULAR | Status: AC | PRN
  Administered 2019-12-03: 05:00:00 100 mL via INTRAVENOUS

## 2019-12-03 MED ORDER — MAGNESIUM OXIDE 400 (241.3 MG) MG PO TABS
200.0000 mg | ORAL_TABLET | Freq: Two times a day (BID) | ORAL | Status: DC
  Administered 2019-12-04: 200 mg via ORAL
  Filled 2019-12-03 (×2): qty 1

## 2019-12-03 MED ORDER — VANCOMYCIN HCL 1750 MG/350ML IV SOLN
1750.0000 mg | Freq: Once | INTRAVENOUS | Status: AC
  Administered 2019-12-03: 04:00:00 1750 mg via INTRAVENOUS
  Filled 2019-12-03: qty 350

## 2019-12-03 MED ORDER — INSULIN ASPART 100 UNIT/ML ~~LOC~~ SOLN: 0.0000 [IU] | Freq: Four times a day (QID) | SUBCUTANEOUS | Status: DC

## 2019-12-03 MED ORDER — VANCOMYCIN HCL IN DEXTROSE 1-5 GM/200ML-% IV SOLN
1000.0000 mg | Freq: Once | INTRAVENOUS | Status: DC
  Filled 2019-12-03: qty 200

## 2019-12-03 MED ORDER — ONDANSETRON 4 MG PO TBDP: 4.0000 mg | ORAL_TABLET | Freq: Four times a day (QID) | ORAL | Status: DC | PRN

## 2019-12-03 MED ORDER — MORPHINE SULFATE (PF) 2 MG/ML IV SOLN
2.0000 mg | INTRAVENOUS | Status: DC | PRN
  Administered 2019-12-03: 2 mg via INTRAVENOUS

## 2019-12-03 MED ORDER — INSULIN ASPART 100 UNIT/ML ~~LOC~~ SOLN: 0.0000 [IU] | Freq: Every day | SUBCUTANEOUS | Status: DC

## 2019-12-03 MED ORDER — DIPHENHYDRAMINE HCL 50 MG/ML IJ SOLN: 12.5000 mg | Freq: Four times a day (QID) | INTRAMUSCULAR | Status: DC | PRN

## 2019-12-03 NOTE — Procedures (Signed)
Incision and Drainage Procedure Note  Pre-operative Diagnosis: abdominal wall abscess  Post-operative Diagnosis: same  Indications: intraabdominal abscess that appears to be decompressing through abdominal wall  Anesthesia: 1% plain lidocaine  Procedure Details  The procedure, risks and complications have been discussed in detail (including, but not limited to airway compromise, infection, bleeding) with the patient, and the patient has signed consent to the procedure.  The skin was sterilely prepped and draped over the affected area in the usual fashion. After adequate local anesthesia, I&D with a #11 blade was performed on the midline abdominal wound. Purulent drainage: present. No enteric drainage noted from midline wound. Wound packed with dry 4x4 guaze.  The patient was observed until stable.  Findings: Purulent drainage evacuated and wound tracks slightly lateral toward ileostomy   EBL: <5 cc's  Drains: none  Condition: Tolerated procedure well and Stable   Complications: none.

## 2019-12-03 NOTE — Progress Notes (Signed)
Told patient about incentive spirometer, and patient refused as he states. " I have never used one."

## 2019-12-03 NOTE — ED Provider Notes (Signed)
[facility] DEPT Provider Note   CSN: [id number] Arrival date & time: 12/03/19  5885     History Chief Complaint  Patient presents with  . Post-op Problem    Mark Frey is a 64 y.o. male.  Patient presents to the emergency department with a chief complaint of purulent discharge from his abdominal incision.  He has history of prior total colectomy in November of last year.  He reports that he was just seen for routine follow-up a few days ago by a Psychologist, sport and exercise.  Reports that everything had been going well.  He is having normal output in his colostomy bag.  He denies any fevers or chills.  Denies any nausea or vomiting.  He reports that he has had some mild lower abdominal pain over the past couple of days that has been gradually worsening.  He did not notice the discharge until today.  At first he thought it was his colostomy bag leaking, but then noted that there was discharge from the old abdominal incision.  The history is provided by the patient. No language interpreter was used.       Past Medical History:  Diagnosis Date  . ALLERGIC RHINITIS   . Cancer ([place])    Lymphoma   . Diabetes mellitus   . Hyperlipidemia     Patient Active Problem List   Diagnosis Date Noted  . Status post colectomy 10/09/2019  . Diverticulitis large intestine 07/03/2019  . Intra-abdominal abscess ([place]) 05/04/2019  . Depression, major, single episode, complete remission ([place]) 04/17/2019  . Pancolitis ([place]) 02/22/2019  . 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 Thrombocytopenia ([place])   . Symptomatic anemia   . Atrophy of muscle of multiple sites   . Goals of care, counseling/discussion   . Hypomagnesemia 01/11/2019  . Neutropenia ([place]) 01/11/2019  . Neutropenia with fever ([place])   . Pressure injury of skin 01/07/2019  . Zinc deficiency   . Radiation gastroenteritis   . Radiation colitis   . Hyponatremia 01/01/2019  .  Pancytopenia ([place]) 01/01/2019  . Hematochezia   . Loop ilesotomy for fecal diversion of sigmoid stricture 08/25/2018  . Severe protein-calorie malnutrition ([place]) 08/19/2018  . 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 Low magnesium level 07/30/2018  . Bowel obstruction in setting of lymphoma and sigmoid stricture 07/21/2018  . Edema   . History of deep venous thrombosis (DVT) of distal vein of right lower extremity   . Counseling regarding advance care planning and goals of care 04/25/2018  . Gastrointestinal hemorrhage   . 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 Port-A-Cath in place 03/27/2018  . 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 Bilateral pulmonary embolism ([place]) 03/10/2018  . Occult blood in stools 03/10/2018  . Tachycardia 01/31/2017  . Controlled diabetes mellitus type II without complication ([place]) 02/77/4128  . Hypertension 06/05/2014  . Irritable bowel syndrome 03/30/2010  . Dysmetabolic syndrome X 78/67/6720  . BACK PAIN WITH RADICULOPATHY 06/09/2007  . Hyperlipidemia 05/12/2007  . ALLERGIC RHINITIS 05/12/2007    Past Surgical History:  Procedure Laterality Date  . BIOPSY  03/15/2018   Procedure: BIOPSY;  Surgeon: Milus Banister, MD;  Location: [name];  Service: Endoscopy;;  . BIOPSY  03/01/2019   Procedure: BIOPSY;  Surgeon: Ladene Artist, MD;  Location: [facility];  Service: Endoscopy;;  . BOWEL RESECTION N/A 08/25/2018   Procedure: LAPROSCOPIC LOOP ILEOSTOMY;  Surgeon: Greer Pickerel,  MD;  Location: [facility];  Service: General;  Laterality: N/A;  . CLEFT PALATE REPAIR    . COLONOSCOPY N/A 03/15/2018   Procedure: COLONOSCOPY;  Surgeon: Milus Banister, MD;  Location: [facility];  Service: Endoscopy;  Laterality: N/A;  . COLONOSCOPY N/A 08/20/2018   Procedure: COLONOSCOPY;  Surgeon: Irene Shipper, MD;  Location: [facility];  Service: Endoscopy;  Laterality: N/A;  . deviated septum repair     slight improvement    . ESOPHAGOGASTRODUODENOSCOPY N/A 03/15/2018   Procedure: ESOPHAGOGASTRODUODENOSCOPY (EGD);  Surgeon: Milus Banister, MD;  Location: [name];  Service: Endoscopy;  Laterality: N/A;  . FLEXIBLE SIGMOIDOSCOPY N/A 03/01/2019   Procedure: FLEXIBLE SIGMOIDOSCOPY;  Surgeon: Ladene Artist, MD;  Location: [facility];  Service: Endoscopy;  Laterality: N/A;  . ILEOSCOPY N/A 03/01/2019   Procedure: ILEOSCOPY THROUGH STOMA;  Surgeon: Ladene Artist, MD;  Location: [facility];  Service: Endoscopy;  Laterality: N/A;  . IR IMAGING GUIDED PORT INSERTION  03/17/2018  . LAPAROSCOPY N/A 08/25/2018   Procedure: LAPAROSCOPY DIAGNOSTIC;  Surgeon: Greer Pickerel, MD;  Location: [facility];  Service: General;  Laterality: N/A;  . TONSILLECTOMY         Family History  Problem Relation Age of [place]  . Lung cancer Mother        smoker  . Brain cancer Mother        metastasis  . AAA (abdominal aortic aneurysm) Father        smoker    Social History   Tobacco Use  . Smoking status: Never Smoker  . Smokeless tobacco: Never Used  Substance Use Topics  . Alcohol use: Yes    Comment: occasional  . Drug use: No    Home Medications Prior to Admission medications   Medication Sig Start Date End Date Taking? Authorizing Provider  B Complex Vitamins (VITAMIN B COMPLEX PO) Take by mouth.    [provider]  blood glucose meter kit and supplies Dispense based on patient and insurance preference. Use daily as directed. (E11.9). 03/31/18   [name], MD  citalopram (CELEXA) 20 MG tablet Take 1 tablet (20 mg total) by mouth daily. 04/17/19 04/11/20  [name], MD  glucose blood (FREESTYLE TEST STRIPS) test strip Use to check blood sugar daily 03/30/18   [name], MD    Allergies    Ciprofloxacin  Review of Systems   Review of Systems  All other systems reviewed and are negative.   Physical Exam Updated Vital Signs BP 137/75 (BP Location: Left Arm)   Pulse (!) 110   Temp  98.2 F (36.8 C) (Oral)   Resp 16   Ht 5' 9"  (1.753 m)   Wt 83.9 kg   SpO2 96%   BMI 27.32 kg/m   Physical Exam Vitals and nursing note reviewed.  Constitutional:      Appearance: He is well-developed.  HENT:     Head: Normocephalic and atraumatic.  Eyes:     Conjunctiva/sclera: Conjunctivae normal.  Cardiovascular:     Rate and Rhythm: Normal rate and regular rhythm.     Heart sounds: No murmur.  Pulmonary:     Effort: Pulmonary effort is normal. No respiratory distress.     Breath sounds: Normal breath sounds.  Abdominal:     Palpations: Abdomen is soft.     Tenderness: There is abdominal tenderness.     Comments: Colostomy bag in central abdomen, prior abdominal incisions noted that appear to have healed  with the exception of a small hole just inferior to the naval.  There is purulent discharge that is able to be expressed from this hole, there is surrounding erythema consistent with cellulitis and concern for underlying abscess  Musculoskeletal:        General: Normal range of motion.     Cervical back: Neck supple.  Skin:    General: Skin is warm and dry.     Findings: Erythema present.  Neurological:     Mental Status: He is alert and oriented to person, place, and time.  Psychiatric:        Mood and Affect: Mood normal.        Behavior: Behavior normal.     ED Results / Procedures / Treatments   Labs (all labs ordered are listed, but only abnormal results are displayed) Labs Reviewed  CBC WITH DIFFERENTIAL/PLATELET - Abnormal; Notable for the following components:      Result Value   WBC 11.6 (*)    RBC 2.85 (*)    Hemoglobin 9.0 (*)    HCT 29.0 (*)    MCV 101.8 (*)    RDW 15.9 (*)    Neutro Abs 9.7 (*)    All other components within normal limits  COMPREHENSIVE METABOLIC PANEL - Abnormal; Notable for the following components:   Sodium 134 (*)    Potassium 3.4 (*)    Chloride 96 (*)    Glucose, Bld 123 (*)    Calcium 8.7 (*)    Albumin 3.1 (*)     All other components within normal limits  CULTURE, BLOOD (ROUTINE X 2)  CULTURE, BLOOD (ROUTINE X 2)  SARS CORONAVIRUS 2 (TAT 6-24 HRS)  LACTIC ACID, PLASMA  PROTIME-INR  APTT  LACTIC ACID, PLASMA  I-STAT CREATININE, ED    EKG None  Radiology CT ABDOMEN PELVIS W CONTRAST  Result Date: 12/03/2019 CLINICAL DATA:  Surgical site complication. History of lymphoma and diverting ostomy EXAM: CT ABDOMEN AND PELVIS WITH CONTRAST TECHNIQUE: Multidetector CT imaging of the abdomen and pelvis was performed using the standard protocol following bolus administration of intravenous contrast. CONTRAST:  165m OMNIPAQUE IOHEXOL 300 MG/ML  SOLN COMPARISON:  07/03/2019 FINDINGS: Lower chest: Streaky opacity in the lower lungs. There is a catheter tip at the superior cavoatrial junction on the right. Hepatobiliary: No focal liver abnormality.Focal thickening of the gallbladder fundus that is stable and has a cystic appearance, likely adenomyomatosis. Pancreas: Generalized atrophy Spleen: Unremarkable. Adrenals/Urinary Tract: Negative adrenals. No hydronephrosis or ureteral stone. 8 mm stone at the right renal pelvis. Chronic thick walled appearance of the left-sided bladder dome which is contiguous with inflammatory process in the left lower quadrant. Stomach/Bowel: Subtotal colectomy and diverting ileostomy. Continued inflammation in the left lower quadrant. There are 2 areas of peripherally enhancing low-density at the sigmoidectomy bed, measuring up to 2.8 cm. These have ventral low-density components which appear fatty. Fatty mass with peripheral high density is also seen in the left subhepatic space measuring 4 cm. Small volume hepatorenal fossa fluid without rim enhancement at this level. There is a fluid collection extending from the deep fascia to the umbilicus which measures 4 x 2 cm on axial slices. Small bowel is distended with fluid levels proximal to the tethered loops. Vascular/Lymphatic: No acute  vascular abnormality. No mass or adenopathy. Reproductive:No pathologic findings. Other: No ascites or pneumoperitoneum. Musculoskeletal: No acute abnormalities. IMPRESSION: 1. Interval subtotal colectomy with persistent or recurrent left lower quadrant inflammation and tethered bowel/bladder dome. There are  2 low-density collections in the left lower quadrant measuring up to 3 cm, which have fat levels suggesting fat necrosis. A separate fluid collection is seen from the deep fascia to the umbilicus, 4 x 2 cm. 2. Fat necrosis in the left subdiaphragmatic space. Small volume fluid seen in the hepatorenal fossa without rim enhancement. 3. Partial obstruction related to the small bowel tethering in the left lower quadrant. Electronically Signed   By: Monte Fantasia M.D.   On: 12/03/2019 05:18    Procedures Procedures (including critical care time)  Medications Ordered in ED Medications  cefTRIAXone (ROCEPHIN) 2 g in sodium chloride 0.9 % 100 mL IVPB (0 g Intravenous Stopped 12/03/19 0450)  vancomycin (VANCOREADY) IVPB 1750 mg/350 mL (1,750 mg Intravenous New Bag/Given 12/03/19 0409)  sodium chloride (PF) 0.9 % injection (has no administration in time range)  iohexol (OMNIPAQUE) 300 MG/ML solution 100 mL (100 mLs Intravenous Contrast Given 12/03/19 0444)    ED Course  I have reviewed the triage vital signs and the nursing notes.  Pertinent labs & imaging results that were available during my care of the patient were reviewed by me and considered in my medical decision making (see chart for details).    MDM Rules/Calculators/A&P                      Patient with purulent discharge coming from remote prior surgical incision anterior to the navel.  His last surgery was in November.  He had recent office visit with his surgeon several days ago, and everything was normal.  He states that this discharge and redness rapidly developed over the past day.  He reports some associated pain.  Denies any  purulent discharge into the colostomy bag or around the colostomy.  He denies any fevers or chills.  He was noted to be tachycardic to 110 in triage.  He has mild leukocytosis to 11.6.  CT shows several pockets of fluid.  Discussed case with Dr. Marcello Moores, from general surgery, who will evaluate the patient.  Patient seen by and discussed with Dr. Tomi Bamberger.   Final Clinical Impression(s) / ED Diagnoses Final diagnoses:  Abscess    Rx / DC Orders ED Discharge Orders    None       Montine Circle, PA-C 12/03/19 0535    Rolland Porter, MD 12/03/19 2376    Rolland Porter, MD 12/03/19 [phone]

## 2019-12-03 NOTE — Progress Notes (Signed)
A consult was received from an ED physician for vancomycin per pharmacy dosing.  The patient's profile has been reviewed for ht/wt/allergies/indication/available labs.   A one time order has been placed for Vancomycin 1750 mg.  Further antibiotics/pharmacy consults should be ordered by admitting physician if indicated.                       Thank you, Dorrene German 12/03/2019  3:40 AM

## 2019-12-03 NOTE — H&P (Signed)
CC: LOA, draining wound  Requesting provider: Dr Tomi Bamberger  HPI: CRUZ BONG is an 64 y.o. male who is here for evaluation of a draining wound.  Patient is status post a diverting loop ileostomy in 2019 by Dr. Dois Davenport due to a large rectosigmoid Burkitt's lymphoma.  He then underwent whole abdomen radiation and subsequently total abdominal colectomy with end ileostomy in November 2020 at [facility].  His hospital course was prolonged due to postoperative infections.  He was discharged approximately a month later.  Since that time, he has been relatively well at home.  Then approximately 3 days ago he developed loss of appetite.  He denies fevers or nausea and vomiting.  Then last night his midline wound opened up and started draining purulent material.  He presented to the emergency department for evaluation.  Patient does have a history of a PE but appears to be off anticoagulation at this point.  Past Medical History:  Diagnosis Date  . ALLERGIC RHINITIS   . Cancer ([place])    Lymphoma   . Diabetes mellitus   . Hyperlipidemia     Past Surgical History:  Procedure Laterality Date  . BIOPSY  03/15/2018   Procedure: BIOPSY;  Surgeon: Milus Banister, MD;  Location: [name];  Service: Endoscopy;;  . BIOPSY  03/01/2019   Procedure: BIOPSY;  Surgeon: Ladene Artist, MD;  [facility]: [facility];  Service: Endoscopy;;  . BOWEL RESECTION N/A 08/25/2018   Procedure: LAPROSCOPIC LOOP ILEOSTOMY;  Surgeon: Greer Pickerel, MD;  Location: [facility] ORS;  Service: General;  Laterality: N/A;  . CLEFT PALATE REPAIR    . COLONOSCOPY N/A 03/15/2018   Procedure: COLONOSCOPY;  Surgeon: Milus Banister, MD;  [facility];  Service: Endoscopy;  Laterality: N/A;  . COLONOSCOPY N/A 08/20/2018   Procedure: COLONOSCOPY;  Surgeon: Irene Shipper, MD;  [facility];  Service: Endoscopy;  Laterality: N/A;  . deviated septum repair     slight improvement  . ESOPHAGOGASTRODUODENOSCOPY N/A 03/15/2018   Procedure: ESOPHAGOGASTRODUODENOSCOPY (EGD);  Surgeon: Milus Banister, MD;  Location: [name];  Service: Endoscopy;  Laterality: N/A;  . FLEXIBLE SIGMOIDOSCOPY N/A 03/01/2019   Procedure: FLEXIBLE SIGMOIDOSCOPY;  Surgeon: Ladene Artist, MD;  [facility];  Service: Endoscopy;  Laterality: N/A;  . ILEOSCOPY N/A 03/01/2019   Procedure: ILEOSCOPY THROUGH STOMA;  Surgeon: Ladene Artist, MD;  [facility];  Service: Endoscopy;  Laterality: N/A;  . IR IMAGING GUIDED PORT INSERTION  03/17/2018  . LAPAROSCOPY N/A 08/25/2018   Procedure: LAPAROSCOPY DIAGNOSTIC;  Surgeon: Greer Pickerel, MD;  [facility];  Service: General;  Laterality: N/A;  . TONSILLECTOMY      Family History  Problem Relation Age of Onset  . Lung cancer Mother        smoker  . Brain cancer Mother        metastasis  . AAA (abdominal aortic aneurysm) Father        smoker    Social:  reports that he has never smoked. He has never used smokeless tobacco. He reports current alcohol use. He reports that he does not use drugs.  Allergies:  Allergies  Allergen Reactions  . Ciprofloxacin Other (See Comments)    Leg tingling    Medications: I have reviewed the patient's current medications.  Results for orders placed or performed during the hospital encounter of 12/03/19 (from the past 48 hour(s))  CBC with Differential     Status: Abnormal   Collection Time:  12/03/19  3:47 AM  Result Value Ref Range   WBC 11.6 (H) 4.0 - 10.5 K/uL   RBC 2.85 (L) 4.22 - 5.81 MIL/uL   Hemoglobin 9.0 (L) 13.0 - 17.0 g/dL   HCT 29.0 (L) 39.0 - 52.0 %   MCV 101.8 (H) 80.0 - 100.0 fL   MCH 31.6 26.0 - 34.0 pg   MCHC 31.0 30.0 - 36.0 g/dL   RDW 15.9 (H) 11.5 - 15.5 %   Platelets 233 150 - 400 K/uL   nRBC 0.0 0.0 - 0.2 %   Neutrophils Relative % 83 %   Neutro Abs 9.7 (H) 1.7 - 7.7 K/uL   Lymphocytes Relative 7 %   Lymphs Abs 0.8 0.7 - 4.0 K/uL   Monocytes Relative 9 %   Monocytes Absolute 1.0 0.1 - 1.0 K/uL     Eosinophils Relative 0 %   Eosinophils Absolute 0.0 0.0 - 0.5 K/uL   Basophils Relative 0 %   Basophils Absolute 0.0 0.0 - 0.1 K/uL   Immature Granulocytes 1 %   Abs Immature Granulocytes 0.07 0.00 - 0.07 K/uL    Comment: Performed at [facility], [place] [address], [place], [place] [address]  Comprehensive metabolic panel     Status: Abnormal   Collection Time: 12/03/19  3:47 AM  Result Value Ref Range   Sodium 134 (L) 135 - 145 mmol/L   Potassium 3.4 (L) 3.5 - 5.1 mmol/L   Chloride 96 (L) 98 - 111 mmol/L   CO2 28 22 - 32 mmol/L   Glucose, Bld 123 (H) 70 - 99 mg/dL    Comment: Glucose reference range applies only to samples taken after fasting for at least 8 hours.   BUN 11 8 - 23 mg/dL   Creatinine, Ser 0.74 0.61 - 1.24 mg/dL   Calcium 8.7 (L) 8.9 - 10.3 mg/dL   Total Protein 7.0 6.5 - 8.1 g/dL   Albumin 3.1 (L) 3.5 - 5.0 g/dL   AST 36 15 - 41 U/L   ALT 43 0 - 44 U/L   Alkaline Phosphatase 95 38 - 126 U/L   Total Bilirubin 0.6 0.3 - 1.2 mg/dL   GFR calc non Af Amer >60 >60 mL/min   GFR calc Af Amer >60 >60 mL/min   Anion gap 10 5 - 15    Comment: Performed at [facility], [place] [address], [place], [place] [address]  Lactic acid, plasma     Status: None   Collection Time: 12/03/19  3:47 AM  Result Value Ref Range   Lactic Acid, Venous 0.7 0.5 - 1.9 mmol/L    Comment: Performed at [facility], [place] [address] [place], [place] [address]  Protime-INR     Status: None   Collection Time: 12/03/19  3:47 AM  Result Value Ref Range   Prothrombin Time 14.6 11.4 - 15.2 seconds   INR 1.2 0.8 - 1.2    Comment: (NOTE) INR goal varies based on device and disease states. Performed at [facility], [place] [address] [place], [place] [address]   APTT     Status: None   Collection Time: 12/03/19  3:47 AM  Result Value Ref Range   aPTT 31 24 - 36 seconds    Comment: Performed at [facility],  [place] [address], [place],  [address]  I-Stat Creatinine, ED (not at [facility])     Status: None   Collection Time: 12/03/19  4:10 AM  Result Value Ref Range   Creatinine, Ser 0.70 0.61 - 1.24 mg/dL    CT ABDOMEN PELVIS W CONTRAST  Result Date: 12/03/2019 CLINICAL DATA:  Surgical site complication. History of lymphoma and diverting ostomy EXAM: CT ABDOMEN AND PELVIS WITH CONTRAST TECHNIQUE: Multidetector CT imaging of the abdomen and pelvis was performed using the standard protocol following bolus administration of intravenous contrast. CONTRAST:  164m OMNIPAQUE IOHEXOL 300 MG/ML  SOLN COMPARISON:  07/03/2019 FINDINGS: Lower chest: Streaky opacity in the lower lungs. There is a catheter tip at the superior cavoatrial junction on the right. Hepatobiliary: No focal liver abnormality.Focal thickening of the gallbladder fundus that is stable and has a cystic appearance, likely adenomyomatosis. Pancreas: Generalized atrophy Spleen: Unremarkable. Adrenals/Urinary Tract: Negative adrenals. No hydronephrosis or ureteral stone. 8 mm stone at the right renal pelvis. Chronic thick walled appearance of the left-sided bladder dome which is contiguous with inflammatory process in the left lower quadrant. Stomach/Bowel: Subtotal colectomy and diverting ileostomy. Continued inflammation in the left lower quadrant. There are 2 areas of peripherally enhancing low-density at the sigmoidectomy bed, measuring up to 2.8 cm. These have ventral low-density components which appear fatty. Fatty mass with peripheral high density is also seen in the left subhepatic space measuring 4 cm. Small volume hepatorenal fossa fluid without rim enhancement at this level. There is a fluid collection extending from the deep fascia to the umbilicus which measures 4 x 2 cm on axial slices. Small bowel is distended with fluid levels proximal to the tethered loops. Vascular/Lymphatic: No acute vascular abnormality. No mass or adenopathy.  Reproductive:No pathologic findings. Other: No ascites or pneumoperitoneum. Musculoskeletal: No acute abnormalities. IMPRESSION: 1. Interval subtotal colectomy with persistent or recurrent left lower quadrant inflammation and tethered bowel/bladder dome. There are 2 low-density collections in the left lower quadrant measuring up to 3 cm, which have fat levels suggesting fat necrosis. A separate fluid collection is seen from the deep fascia to the umbilicus, 4 x 2 cm. 2. Fat necrosis in the left subdiaphragmatic space. Small volume fluid seen in the hepatorenal fossa without rim enhancement. 3. Partial obstruction related to the small bowel tethering in the left lower quadrant. Electronically Signed   By: JMonte FantasiaM.D.   On: 12/03/2019 05:18    ROS - all of the below systems have been reviewed with the patient and positives are indicated with bold text General: chills, fever or night sweats Eyes: blurry vision or double vision ENT: epistaxis or sore throat Allergy/Immunology: itchy/watery eyes or nasal congestion Hematologic/Lymphatic: bleeding problems, blood clots or swollen lymph nodes Endocrine: temperature intolerance or unexpected weight changes Breast: new or changing breast lumps or nipple discharge Resp: cough, shortness of breath, or wheezing CV: chest pain or dyspnea on exertion GI: as per HPI GU: dysuria, trouble voiding, or hematuria MSK: joint pain or joint stiffness Neuro: TIA or stroke symptoms Derm: pruritus and skin lesion changes Psych: anxiety and depression  PE Blood pressure 103/65, pulse 85, temperature 99.1 F (37.3 C), temperature source Oral, resp. rate 19, height 5' 9"  (1.753 m), weight 83.9 kg, SpO2 98 %. Constitutional: NAD; conversant; no deformities Eyes: Moist conjunctiva; no lid lag; anicteric; PERRL Neck: Trachea midline; no thyromegaly Lungs: Normal respiratory effort; no tactile fremitus CV: RRR; no palpable thrills; no pitting edema GI: Abd soft;  midline wound draining purulence MSK: Normal range of motion of extremities; no clubbing/cyanosis Psychiatric: Appropriate affect; alert and oriented x3 Lymphatic: No palpable cervical or axillary lymphadenopathy  Results for orders placed  or performed during the hospital encounter of 12/03/19 (from the past 48 hour(s))  CBC with Differential     Status: Abnormal   Collection Time: 12/03/19  3:47 AM  Result Value Ref Range   WBC 11.6 (H) 4.0 - 10.5 K/uL   RBC 2.85 (L) 4.22 - 5.81 MIL/uL   Hemoglobin 9.0 (L) 13.0 - 17.0 g/dL   HCT 29.0 (L) 39.0 - 52.0 %   MCV 101.8 (H) 80.0 - 100.0 fL   MCH 31.6 26.0 - 34.0 pg   MCHC 31.0 30.0 - 36.0 g/dL   RDW 15.9 (H) 11.5 - 15.5 %   Platelets 233 150 - 400 K/uL   nRBC 0.0 0.0 - 0.2 %   Neutrophils Relative % 83 %   Neutro Abs 9.7 (H) 1.7 - 7.7 K/uL   Lymphocytes Relative 7 %   Lymphs Abs 0.8 0.7 - 4.0 K/uL   Monocytes Relative 9 %   Monocytes Absolute 1.0 0.1 - 1.0 K/uL   Eosinophils Relative 0 %   Eosinophils Absolute 0.0 0.0 - 0.5 K/uL   Basophils Relative 0 %   Basophils Absolute 0.0 0.0 - 0.1 K/uL   Immature Granulocytes 1 %   Abs Immature Granulocytes 0.07 0.00 - 0.07 K/uL    Comment: Performed at [facility], [place] [address], [place], [place] [address]  Comprehensive metabolic panel     Status: Abnormal   Collection Time: 12/03/19  3:47 AM  Result Value Ref Range   Sodium 134 (L) 135 - 145 mmol/L   Potassium 3.4 (L) 3.5 - 5.1 mmol/L   Chloride 96 (L) 98 - 111 mmol/L   CO2 28 22 - 32 mmol/L   Glucose, Bld 123 (H) 70 - 99 mg/dL    Comment: Glucose reference range applies only to samples taken after fasting for at least 8 hours.   BUN 11 8 - 23 mg/dL   Creatinine, Ser 0.74 0.61 - 1.24 mg/dL   Calcium 8.7 (L) 8.9 - 10.3 mg/dL   Total Protein 7.0 6.5 - 8.1 g/dL   Albumin 3.1 (L) 3.5 - 5.0 g/dL   AST 36 15 - 41 U/L   ALT 43 0 - 44 U/L   Alkaline Phosphatase 95 38 - 126 U/L   Total Bilirubin 0.6 0.3 - 1.2 mg/dL    GFR calc non Af Amer >60 >60 mL/min   GFR calc Af Amer >60 >60 mL/min   Anion gap 10 5 - 15    Comment: Performed at [facility], [place] [address], [place], [place] [address]  Lactic acid, plasma     Status: None   Collection Time: 12/03/19  3:47 AM  Result Value Ref Range   Lactic Acid, Venous 0.7 0.5 - 1.9 mmol/L    Comment: Performed at [facility], [place] [address] [place], [place] [address]  Protime-INR     Status: None   Collection Time: 12/03/19  3:47 AM  Result Value Ref Range   Prothrombin Time 14.6 11.4 - 15.2 seconds   INR 1.2 0.8 - 1.2    Comment: (NOTE) INR goal varies based on device and disease states. Performed at [facility], [place] [address] [place], [place] [address]   APTT     Status: None   Collection Time: 12/03/19  3:47 AM  Result Value Ref Range   aPTT 31 24 - 36 seconds    Comment: Performed at [facility], [place] [address]  [place], [place] [address]  I-Stat Creatinine, ED (not at [facility])     Status: None   Collection Time: 12/03/19  4:10 AM  Result Value Ref Range   Creatinine, Ser 0.70 0.61 - 1.24 mg/dL    CT ABDOMEN PELVIS W CONTRAST  Result Date: 12/03/2019 CLINICAL DATA:  Surgical site complication. History of lymphoma and diverting ostomy EXAM: CT ABDOMEN AND PELVIS WITH CONTRAST TECHNIQUE: Multidetector CT imaging of the abdomen and pelvis was performed using the standard protocol following bolus administration of intravenous contrast. CONTRAST:  15m OMNIPAQUE IOHEXOL 300 MG/ML  SOLN COMPARISON:  07/03/2019 FINDINGS: Lower chest: Streaky opacity in the lower lungs. There is a catheter tip at the superior cavoatrial junction on the right. Hepatobiliary: No focal liver abnormality.Focal thickening of the gallbladder fundus that is stable and has a cystic appearance, likely adenomyomatosis. Pancreas: Generalized atrophy Spleen: Unremarkable. Adrenals/Urinary Tract: Negative adrenals.  No hydronephrosis or ureteral stone. 8 mm stone at the right renal pelvis. Chronic thick walled appearance of the left-sided bladder dome which is contiguous with inflammatory process in the left lower quadrant. Stomach/Bowel: Subtotal colectomy and diverting ileostomy. Continued inflammation in the left lower quadrant. There are 2 areas of peripherally enhancing low-density at the sigmoidectomy bed, measuring up to 2.8 cm. These have ventral low-density components which appear fatty. Fatty mass with peripheral high density is also seen in the left subhepatic space measuring 4 cm. Small volume hepatorenal fossa fluid without rim enhancement at this level. There is a fluid collection extending from the deep fascia to the umbilicus which measures 4 x 2 cm on axial slices. Small bowel is distended with fluid levels proximal to the tethered loops. Vascular/Lymphatic: No acute vascular abnormality. No mass or adenopathy. Reproductive:No pathologic findings. Other: No ascites or pneumoperitoneum. Musculoskeletal: No acute abnormalities. IMPRESSION: 1. Interval subtotal colectomy with persistent or recurrent left lower quadrant inflammation and tethered bowel/bladder dome. There are 2 low-density collections in the left lower quadrant measuring up to 3 cm, which have fat levels suggesting fat necrosis. A separate fluid collection is seen from the deep fascia to the umbilicus, 4 x 2 cm. 2. Fat necrosis in the left subdiaphragmatic space. Small volume fluid seen in the hepatorenal fossa without rim enhancement. 3. Partial obstruction related to the small bowel tethering in the left lower quadrant. Electronically Signed   By: JMonte FantasiaM.D.   On: 12/03/2019 05:18     A/P: DEWELL BENASSIis an 64y.o. male with what appears to be a draining intra-abdominal infection through his midline wound.  He has been started on IV antibiotics.  I have recommended that we open this wound to allow for this to drain  appropriately.  If his GI symptoms do not improve with drainage, [facility] previous [facility] history and whole abdominal radiation.  We will admit to the floor and keep him n.p.o. for today.  I would like to hold off on NG tube to see if clearing the infection from his abdomen will help with his ileus.   ARosario Adie MD  Colorectal and [facility]

## 2019-12-03 NOTE — Plan of Care (Signed)

## 2019-12-03 NOTE — ED Triage Notes (Signed)
Pt presents with an post-op problem. Pt is unable to tell me when the surgery was. Per pts wife, pt had an complete colectomy on 07/30/2019 - colostomy is in place. Wife states yesterday the incision site looked good, however, the pt notified her today that the bag was leaking. Wife went to change the colostomy back and noticed green/black drainage from the incision site. Wife also noticed that the skin surrounding the area is red and warm to the touch. Pt is A&Ox3 which is baseline according to wife.

## 2019-12-04 LAB — CBC
HCT: 28.9 % — ABNORMAL LOW (ref 39.0–52.0)
Hemoglobin: 8.9 g/dL — ABNORMAL LOW (ref 13.0–17.0)
MCH: 31.7 pg (ref 26.0–34.0)
MCHC: 30.8 g/dL (ref 30.0–36.0)
MCV: 102.8 fL — ABNORMAL HIGH (ref 80.0–100.0)
Platelets: 216 10*3/uL (ref 150–400)
RBC: 2.81 MIL/uL — ABNORMAL LOW (ref 4.22–5.81)
RDW: 15.7 % — ABNORMAL HIGH (ref 11.5–15.5)
WBC: 5.9 10*3/uL (ref 4.0–10.5)
nRBC: 0 % (ref 0.0–0.2)

## 2019-12-04 LAB — GLUCOSE, CAPILLARY
Glucose-Capillary: 104 mg/dL — ABNORMAL HIGH (ref 70–99)
Glucose-Capillary: 113 mg/dL — ABNORMAL HIGH (ref 70–99)

## 2019-12-04 LAB — BASIC METABOLIC PANEL
Anion gap: 9 (ref 5–15)
BUN: 9 mg/dL (ref 8–23)
CO2: 28 mmol/L (ref 22–32)
Calcium: 8.5 mg/dL — ABNORMAL LOW (ref 8.9–10.3)
Chloride: 99 mmol/L (ref 98–111)
Creatinine, Ser: 0.6 mg/dL — ABNORMAL LOW (ref 0.61–1.24)
GFR calc Af Amer: >60 mL/min (ref >60)
GFR calc non Af Amer: >60 mL/min (ref >60)
Glucose, Bld: 114 mg/dL — ABNORMAL HIGH (ref 70–99)
Potassium: 3.6 mmol/L (ref 3.5–5.1)
Sodium: 136 mmol/L (ref 135–145)

## 2019-12-04 MED ORDER — SODIUM CHLORIDE 0.9% FLUSH: 10.0000 mL | Freq: Two times a day (BID) | INTRAVENOUS | Status: DC

## 2019-12-04 MED ORDER — CEFDINIR 300 MG PO CAPS: 300.0000 mg | ORAL_CAPSULE | Freq: Two times a day (BID) | ORAL | 0 refills | Status: AC

## 2019-12-04 MED ORDER — ACETAMINOPHEN 325 MG PO TABS: 650.0000 mg | ORAL_TABLET | Freq: Four times a day (QID) | ORAL | Status: AC | PRN

## 2019-12-04 MED ORDER — HEPARIN SOD (PORK) LOCK FLUSH 100 UNIT/ML IV SOLN
500.0000 [IU] | INTRAVENOUS | Status: AC | PRN
  Administered 2019-12-04: 500 [IU]
  Filled 2019-12-04: qty 5

## 2019-12-04 MED ORDER — SODIUM CHLORIDE 0.9% FLUSH: 10.0000 mL | INTRAVENOUS | Status: DC | PRN

## 2019-12-04 MED ORDER — CEFDINIR 300 MG PO CAPS
300.0000 mg | ORAL_CAPSULE | Freq: Two times a day (BID) | ORAL | Status: DC
  Administered 2019-12-04: 300 mg via ORAL
  Filled 2019-12-04 (×2): qty 1

## 2019-12-04 NOTE — TOC Progression Note (Addendum)
Transition of Care [facility]) - Progression Note    Patient Details  Name: Mark Frey MRN: [id number] Date of Birth: 03-05-1956  Transition of Care [facility]) CM/SW [place], [name] Phone Number: 12/04/2019, 10:55 AM  Clinical Narrative:    Patient and his spouse decline home health. Spouse reports she has been assisting with the patient dressing changes and will continue to do so. She does not need additional teaching. She has requested additional supplies for home, the nurse notified.    Expected Discharge Plan: [place] Barriers to Discharge: Barriers Resolved  Expected Discharge Plan and Services Expected Discharge Plan: [place] In-house Referral: Clinical Social Work   Post Acute Care Choice: [place] arrangements for the past 2 months: Single Family Home Expected Discharge Date: 12/04/19                                     Social Determinants of Health (SDOH) Interventions    Readmission Risk Interventions Readmission Risk Prevention Plan 02/08/2019  Transportation Screening Complete  Medication Review Press photographer) Complete  PCP or Specialist appointment within 3-5 days of discharge Complete  HRI or [place] Complete  SW Recovery Care/Counseling Consult Complete  [facility] Not Applicable  Some recent data might be hidden

## 2019-12-04 NOTE — Discharge Summary (Signed)
[facility] Discharge Summary   Patient ID: Mark Frey MRN: [id number] DOB/AGE: 64-14-1957 64 y.o.  Admit date: 12/03/2019 Discharge date: 12/04/2019  Admitting Diagnosis: Intra-abdominal abscess draining through midline wound   Discharge Diagnosis Intra-abdominal abscess draining through midline wound   Consultants None   Imaging: CT ABDOMEN PELVIS W CONTRAST  Result Date: 12/03/2019 CLINICAL DATA:  Surgical site complication. History of lymphoma and diverting ostomy EXAM: CT ABDOMEN AND PELVIS WITH CONTRAST TECHNIQUE: Multidetector CT imaging of the abdomen and pelvis was performed using the standard protocol following bolus administration of intravenous contrast. CONTRAST:  128m OMNIPAQUE IOHEXOL 300 MG/ML  SOLN COMPARISON:  07/03/2019 FINDINGS: Lower chest: Streaky opacity in the lower lungs. There is a catheter tip at the superior cavoatrial junction on the right. Hepatobiliary: No focal liver abnormality.Focal thickening of the gallbladder fundus that is stable and has a cystic appearance, likely adenomyomatosis. Pancreas: Generalized atrophy Spleen: Unremarkable. Adrenals/Urinary Tract: Negative adrenals. No hydronephrosis or ureteral stone. 8 mm stone at the right renal pelvis. Chronic thick walled appearance of the left-sided bladder dome which is contiguous with inflammatory process in the left lower quadrant. Stomach/Bowel: Subtotal colectomy and diverting ileostomy. Continued inflammation in the left lower quadrant. There are 2 areas of peripherally enhancing low-density at the sigmoidectomy bed, measuring up to 2.8 cm. These have ventral low-density components which appear fatty. Fatty mass with peripheral high density is also seen in the left subhepatic space measuring 4 cm. Small volume hepatorenal fossa fluid without rim enhancement at this level. There is a fluid collection extending from the deep fascia to the umbilicus which measures 4 x 2 cm on axial slices.  Small bowel is distended with fluid levels proximal to the tethered loops. Vascular/Lymphatic: No acute vascular abnormality. No mass or adenopathy. Reproductive:No pathologic findings. Other: No ascites or pneumoperitoneum. Musculoskeletal: No acute abnormalities. IMPRESSION: 1. Interval subtotal colectomy with persistent or recurrent left lower quadrant inflammation and tethered bowel/bladder dome. There are 2 low-density collections in the left lower quadrant measuring up to 3 cm, which have fat levels suggesting fat necrosis. A separate fluid collection is seen from the deep fascia to the umbilicus, 4 x 2 cm. 2. Fat necrosis in the left subdiaphragmatic space. Small volume fluid seen in the hepatorenal fossa without rim enhancement. 3. Partial obstruction related to the small bowel tethering in the left lower quadrant. Electronically Signed   By: JMonte FantasiaM.D.   On: 12/03/2019 05:18    Procedures KClaiborne BillingsRayburn PA-C (12/03/19) - Bedside Incision and Drainage  Hospital Course:  [facility] 64[facility].  Workup showed intraabdominal abscess that was draining through midline wound.  Patient was admitted and underwent procedure listed above.  Tolerated procedure well and was transferred to the floor.  Diet was advanced as tolerated.  On 12/04/19, the patient was voiding well, tolerating diet, ambulating well, pain well controlled, vital signs stable and felt stable for discharge home.  [facility]   Physical Exam: General: pleasant, WD, WN white male who is laying in bed in NAD HEENT: head is normocephalic, atraumatic.  Sclera are noninjected.  PERRL.  Ears and nose without any masses or lesions.  Mouth is pink and moist Heart: regular, rate, and rhythm.  Normal s1,s2. No obvious murmurs, gallops, or rubs noted.  Palpable radial and pedal pulses bilaterally Lungs: CTAB, no wheezes, rhonchi, or rales noted.   Respiratory  effort nonlabored Abd: soft, NT, ND, +BS, ileostomy working with liquid stool, midline wound with small amount purulent drainage MS: all 4 extremities are symmetrical with no cyanosis, clubbing, or edema. Skin: warm and dry with no masses, lesions, or rashes Neuro: Cranial nerves 2-12 grossly intact, sensation is normal throughout Psych: A&Ox3 with an appropriate affect.   Allergies as of 12/04/2019      Reactions   Ciprofloxacin Other (See Comments)   Leg tingling      Medication List    TAKE these medications   acetaminophen 325 MG tablet Commonly known as: TYLENOL Take 2 tablets (650 mg total) by mouth every 6 (six) hours as needed for mild pain (or temp > 100).   blood glucose meter kit and supplies Dispense based on patient and insurance preference. Use daily as directed. (E11.9).   cefdinir 300 MG capsule Commonly known as: OMNICEF Take 1 capsule (300 mg total) by mouth every 12 (twelve) hours for 10 days.   citalopram 20 MG tablet Commonly known as: CELEXA Take 1 tablet (20 mg total) by mouth daily.   glucose blood test strip Commonly known as: FREESTYLE TEST STRIPS Use to check blood sugar daily   loperamide 2 MG capsule Commonly known as: IMODIUM Take 2 mg by mouth as needed for diarrhea or loose stools.   Magnesium 100 MG Tabs Take 150 mg by mouth in the morning, at noon, and at bedtime.   VITAMIN B COMPLEX PO Take 1 tablet by mouth daily.        Follow-up Information    Urban Gibson, MD. Call.   Specialty: Colon and Rectal Surgery Why: Call and schedule an appointment to follow up in 1-2 weeks for wound infection. Contact information: [place] [address] [phone]           Signed: Brigid Re, [facility] 12/04/2019, 10:04 AM Please see Amion for pager number during day hours 7:00am-4:30pm

## 2019-12-04 NOTE — Discharge Instructions (Signed)
WOUND CARE: - midline dressing to be changed three times daily - supplies: sterile saline, gauze, scissors, ABD pads, tape  - remove dressing and all packing carefully, moistening with sterile saline as needed to avoid packing/internal dressing sticking to the wound. - clean edges of skin around the wound with water/gauze, making sure there is no tape debris or leakage left on skin that could cause skin irritation or breakdown. - dampen and clean gauze with sterile saline and pack wound from wound base to skin level, making sure to take note of any possible areas of wound tracking, tunneling and packing appropriately. Wound can be packed loosely. Trim gauze to size if a whole gauze is not required. - cover wound with a dry ABD pad and secure with tape.  - write the date/time on the dry dressing/tape to better track when the last dressing change occurred. - apply any skin protectant/powder recommended by clinician to protect skin/skin folds. - change dressing as needed if leakage occurs, wound gets contaminated, or patient requests to shower. - patient may shower daily with wound open and following the shower the wound should be dried and a clean dressing placed.   If bandages are saturated with drainage at each change - switch to dry gauze packing until this improves. If having less drainage after a few days, can transition to twice daily dressing changes.

## 2019-12-08 LAB — CULTURE, BLOOD (ROUTINE X 2)
Culture: NO GROWTH
Culture: NO GROWTH
Special Requests: ADEQUATE

## 2020-01-06 ENCOUNTER — Encounter (HOSPITAL_COMMUNITY): Payer: Self-pay

## 2020-01-06 ENCOUNTER — Other Ambulatory Visit: Payer: Self-pay

## 2020-01-06 DIAGNOSIS — L7682 Other postprocedural complications of skin and subcutaneous tissue: Secondary | ICD-10-CM | POA: Diagnosis not present

## 2020-01-06 DIAGNOSIS — Z79899 Other long term (current) drug therapy: Secondary | ICD-10-CM | POA: Insufficient documentation

## 2020-01-06 DIAGNOSIS — L02211 Cutaneous abscess of abdominal wall: Secondary | ICD-10-CM | POA: Insufficient documentation

## 2020-01-06 DIAGNOSIS — E785 Hyperlipidemia, unspecified: Secondary | ICD-10-CM | POA: Diagnosis not present

## 2020-01-06 DIAGNOSIS — D61818 Other pancytopenia: Secondary | ICD-10-CM | POA: Diagnosis not present

## 2020-01-06 DIAGNOSIS — K520 Gastroenteritis and colitis due to radiation: Secondary | ICD-10-CM | POA: Diagnosis not present

## 2020-01-06 DIAGNOSIS — C8370 Burkitt lymphoma, unspecified site: Secondary | ICD-10-CM | POA: Insufficient documentation

## 2020-01-06 DIAGNOSIS — Z801 Family history of malignant neoplasm of trachea, bronchus and lung: Secondary | ICD-10-CM | POA: Insufficient documentation

## 2020-01-06 DIAGNOSIS — L0390 Cellulitis, unspecified: Secondary | ICD-10-CM | POA: Diagnosis present

## 2020-01-06 DIAGNOSIS — E871 Hypo-osmolality and hyponatremia: Secondary | ICD-10-CM | POA: Insufficient documentation

## 2020-01-06 DIAGNOSIS — Z933 Colostomy status: Secondary | ICD-10-CM | POA: Diagnosis not present

## 2020-01-06 DIAGNOSIS — F329 Major depressive disorder, single episode, unspecified: Secondary | ICD-10-CM | POA: Diagnosis not present

## 2020-01-06 DIAGNOSIS — I700 Atherosclerosis of aorta: Secondary | ICD-10-CM | POA: Diagnosis not present

## 2020-01-06 DIAGNOSIS — Z20822 Contact with and (suspected) exposure to covid-19: Secondary | ICD-10-CM | POA: Diagnosis not present

## 2020-01-06 DIAGNOSIS — M549 Dorsalgia, unspecified: Secondary | ICD-10-CM | POA: Insufficient documentation

## 2020-01-06 DIAGNOSIS — Y839 Surgical procedure, unspecified as the cause of abnormal reaction of the patient, or of later complication, without mention of misadventure at the time of the procedure: Secondary | ICD-10-CM | POA: Diagnosis not present

## 2020-01-06 DIAGNOSIS — E119 Type 2 diabetes mellitus without complications: Secondary | ICD-10-CM | POA: Insufficient documentation

## 2020-01-06 DIAGNOSIS — N200 Calculus of kidney: Secondary | ICD-10-CM | POA: Insufficient documentation

## 2020-01-06 DIAGNOSIS — Z881 Allergy status to other antibiotic agents status: Secondary | ICD-10-CM | POA: Insufficient documentation

## 2020-01-06 DIAGNOSIS — Z9049 Acquired absence of other specified parts of digestive tract: Secondary | ICD-10-CM | POA: Insufficient documentation

## 2020-01-06 DIAGNOSIS — I10 Essential (primary) hypertension: Secondary | ICD-10-CM | POA: Diagnosis not present

## 2020-01-06 DIAGNOSIS — R609 Edema, unspecified: Secondary | ICD-10-CM | POA: Diagnosis not present

## 2020-01-06 DIAGNOSIS — Z8249 Family history of ischemic heart disease and other diseases of the circulatory system: Secondary | ICD-10-CM | POA: Diagnosis not present

## 2020-01-06 DIAGNOSIS — R000 Tachycardia, unspecified: Secondary | ICD-10-CM | POA: Diagnosis not present

## 2020-01-06 DIAGNOSIS — Z808 Family history of malignant neoplasm of other organs or systems: Secondary | ICD-10-CM | POA: Diagnosis not present

## 2020-01-06 DIAGNOSIS — Z86718 Personal history of other venous thrombosis and embolism: Secondary | ICD-10-CM | POA: Insufficient documentation

## 2020-01-06 LAB — COMPREHENSIVE METABOLIC PANEL
ALT: 23 U/L (ref 0–44)
AST: 20 U/L (ref 15–41)
Albumin: 3.7 g/dL (ref 3.5–5.0)
Alkaline Phosphatase: 69 U/L (ref 38–126)
Anion gap: 8 (ref 5–15)
BUN: 12 mg/dL (ref 8–23)
CO2: 26 mmol/L (ref 22–32)
Calcium: 9.3 mg/dL (ref 8.9–10.3)
Chloride: 106 mmol/L (ref 98–111)
Creatinine, Ser: 0.64 mg/dL (ref 0.61–1.24)
GFR calc Af Amer: >60 mL/min (ref >60)
GFR calc non Af Amer: >60 mL/min (ref >60)
Glucose, Bld: 112 mg/dL — ABNORMAL HIGH (ref 70–99)
Potassium: 4.1 mmol/L (ref 3.5–5.1)
Sodium: 140 mmol/L (ref 135–145)
Total Bilirubin: 0.5 mg/dL (ref 0.3–1.2)
Total Protein: 7.6 g/dL (ref 6.5–8.1)

## 2020-01-06 LAB — CBC WITH DIFFERENTIAL/PLATELET
Abs Immature Granulocytes: 0.02 10*3/uL (ref 0.00–0.07)
Basophils Absolute: 0 10*3/uL (ref 0.0–0.1)
Basophils Relative: 0 %
Eosinophils Absolute: 0.1 10*3/uL (ref 0.0–0.5)
Eosinophils Relative: 1 %
HCT: 33 % — ABNORMAL LOW (ref 39.0–52.0)
Hemoglobin: 10.4 g/dL — ABNORMAL LOW (ref 13.0–17.0)
Immature Granulocytes: 0 %
Lymphocytes Relative: 22 %
Lymphs Abs: 1.4 10*3/uL (ref 0.7–4.0)
MCH: 32.5 pg (ref 26.0–34.0)
MCHC: 31.5 g/dL (ref 30.0–36.0)
MCV: 103.1 fL — ABNORMAL HIGH (ref 80.0–100.0)
Monocytes Absolute: 0.7 10*3/uL (ref 0.1–1.0)
Monocytes Relative: 10 %
Neutro Abs: 4.4 10*3/uL (ref 1.7–7.7)
Neutrophils Relative %: 67 %
Platelets: 205 10*3/uL (ref 150–400)
RBC: 3.2 MIL/uL — ABNORMAL LOW (ref 4.22–5.81)
RDW: 17.2 % — ABNORMAL HIGH (ref 11.5–15.5)
WBC: 6.6 10*3/uL (ref 4.0–10.5)
nRBC: 0 % (ref 0.0–0.2)

## 2020-01-06 LAB — URINALYSIS, ROUTINE W REFLEX MICROSCOPIC
Bilirubin Urine: NEGATIVE
Glucose, UA: NEGATIVE mg/dL
Hgb urine dipstick: NEGATIVE
Ketones, ur: NEGATIVE mg/dL
Leukocytes,Ua: NEGATIVE
Nitrite: NEGATIVE
Protein, ur: NEGATIVE mg/dL
Specific Gravity, Urine: 1.019 (ref 1.005–1.030)
pH: 5 (ref 5.0–8.0)

## 2020-01-06 NOTE — ED Triage Notes (Signed)
Pt reports that his wife was changing his surgical dressing and noticed some purulent drainage. Denies increased pain or fever. Colostomy draining normally.

## 2020-01-07 ENCOUNTER — Emergency Department (HOSPITAL_COMMUNITY): Payer: 59

## 2020-01-07 ENCOUNTER — Encounter (HOSPITAL_COMMUNITY): Payer: Self-pay | Admitting: Emergency Medicine

## 2020-01-07 ENCOUNTER — Observation Stay (HOSPITAL_COMMUNITY)
Admission: EM | Admit: 2020-01-07 | Discharge: 2020-01-07 | Disposition: A | Discharge status: Home / Self Care | Payer: 59 | Attending: Emergency Medicine | Admitting: Emergency Medicine

## 2020-01-07 DIAGNOSIS — L0390 Cellulitis, unspecified: Secondary | ICD-10-CM | POA: Diagnosis present

## 2020-01-07 DIAGNOSIS — L03311 Cellulitis of abdominal wall: Secondary | ICD-10-CM

## 2020-01-07 DIAGNOSIS — T8149XA Infection following a procedure, other surgical site, initial encounter: Secondary | ICD-10-CM

## 2020-01-07 LAB — RESPIRATORY PANEL BY RT PCR (FLU A&B, COVID)
Influenza A by PCR: NEGATIVE
Influenza B by PCR: NEGATIVE
SARS Coronavirus 2 by RT PCR: NEGATIVE

## 2020-01-07 MED ORDER — SODIUM CHLORIDE (PF) 0.9 % IJ SOLN
INTRAMUSCULAR | Status: AC
  Filled 2020-01-07: qty 50

## 2020-01-07 MED ORDER — VANCOMYCIN HCL IN DEXTROSE 1-5 GM/200ML-% IV SOLN
1000.0000 mg | Freq: Once | INTRAVENOUS | Status: AC
  Administered 2020-01-07: 1000 mg via INTRAVENOUS
  Filled 2020-01-07: qty 200

## 2020-01-07 MED ORDER — IOHEXOL 300 MG/ML  SOLN
100.0000 mL | Freq: Once | INTRAMUSCULAR | Status: AC | PRN
  Administered 2020-01-07: 05:00:00 100 mL via INTRAVENOUS

## 2020-01-07 MED ORDER — AMOXICILLIN-POT CLAVULANATE 875-125 MG PO TABS: 1.0000 | ORAL_TABLET | Freq: Two times a day (BID) | ORAL | 0 refills | Status: AC

## 2020-01-07 MED ORDER — HEPARIN SOD (PORK) LOCK FLUSH 100 UNIT/ML IV SOLN
500.0000 [IU] | Freq: Once | INTRAVENOUS | Status: AC
  Administered 2020-01-07: 500 [IU]
  Filled 2020-01-07: qty 5

## 2020-01-07 MED ORDER — PIPERACILLIN-TAZOBACTAM 3.375 G IVPB 30 MIN
3.3750 g | Freq: Once | INTRAVENOUS | Status: AC
  Administered 2020-01-07: 3.375 g via INTRAVENOUS
  Filled 2020-01-07: qty 50

## 2020-01-07 NOTE — ED Provider Notes (Signed)
Initially, plan of care was for this patient to be admitted for further monitoring and management of a possible postoperative infection however surgery is going to see the patient and they reported anticipate he will be stable for discharge home after the evaluate and make a plan.  We will follow up on the recommendations.  After general surgery saw the patient, they were going to discharge the patient for outpatient follow-up.  Patient discharged before I could reevaluate him.    Clinical Impression: 1. Cellulitis of abdominal wall   2. Wound infection after surgery     Disposition: Discharge  Condition: Good  I have discussed the results, Dx and Tx plan with the pt(& family if present). He/she/they expressed understanding and agree(s) with the plan. Discharge instructions discussed at great length. Strict return precautions discussed and pt &/or family have verbalized understanding of the instructions. No further questions at time of discharge.    Discharge Medication List as of 01/07/2020  8:34 AM     START taking these medications   Details  amoxicillin-clavulanate (AUGMENTIN) 875-125 MG tablet Take 1 tablet by mouth 2 (two) times daily for 14 days., Starting Mon 01/07/2020, Until Mon 01/21/2020, Normal        Follow Up: No follow-up provider specified.    Marranda Arakelian, Gwenyth Allegra, MD 01/07/20 [phone]

## 2020-01-07 NOTE — ED Notes (Signed)
Pt transported to CT ?

## 2020-01-07 NOTE — ED Notes (Signed)
Pt ambulated to the bathroom without assistance. Gait steady

## 2020-01-07 NOTE — Discharge Instructions (Signed)
Follow up with your surgeon at [facility]  May pack wound as needed to wick drainage away as done previously

## 2020-01-07 NOTE — Consult Note (Signed)
Mark Frey 08/31/1956  [id number].    Requesting MD: Dr. April Palumbo Chief Complaint/Reason for Consult: abdominal wall abscess  HPI:  This is a 64 yo white male who is well known to our service for multiple issues over the last several years.  He has Burkitt's lymphoma and developed diverticulitis.  He ultimately had a diverting ostomy while receiving treatment for his lymphoma.  He underwent a subtotal abdominal colectomy at [facility] in November and a diverting ileostomy.  He had multiple post operative complications including infections that required his stay to be a month long.  He was seen here at [facility] about 1 month ago for and abdominal wall abscess.  This was I&D.  He followed up with his surgeon at [facility] this past Wednesday with no issues.  Last night of all the sudden, he developed some erythema around his umbilicus and then some spontaneous purulent drainage.  He denies any fevers, chills, abdominal pain, change to his appetite, or any other symptoms.  His wife brought him to the ED for evaluation.  He has a CT scan which is detailed below.  We have been asked to see him for further evaluation.  ROS: ROS: Please see HPI, otherwise all other systems have been reviewed and are negative.  Family History  Problem Relation Age of Onset  . Lung cancer Mother        smoker  . Brain cancer Mother        metastasis  . AAA (abdominal aortic aneurysm) Father        smoker    Past Medical History:  Diagnosis Date  . ALLERGIC RHINITIS   . Cancer ([place])    Lymphoma   . Diabetes mellitus   . Hyperlipidemia     Past Surgical History:  Procedure Laterality Date  . BIOPSY  03/15/2018   Procedure: BIOPSY;  Surgeon: Milus Banister, MD;  Location: [name];  Service: Endoscopy;;  . BIOPSY  03/01/2019   Procedure: BIOPSY;  Surgeon: Ladene Artist, MD;  Location: [facility];  Service: Endoscopy;;  . BOWEL RESECTION N/A 08/25/2018   Procedure: LAPROSCOPIC LOOP ILEOSTOMY;  Surgeon:  Greer Pickerel, MD;  Location: [facility] ORS;  Service: General;  Laterality: N/A;  . CLEFT PALATE REPAIR    . COLONOSCOPY N/A 03/15/2018   Procedure: COLONOSCOPY;  Surgeon: Milus Banister, MD;  Location: [facility];  Service: Endoscopy;  Laterality: N/A;  . COLONOSCOPY N/A 08/20/2018   Procedure: COLONOSCOPY;  Surgeon: Irene Shipper, MD;  Location: [facility];  Service: Endoscopy;  Laterality: N/A;  . deviated septum repair     slight improvement  . ESOPHAGOGASTRODUODENOSCOPY N/A 03/15/2018   Procedure: ESOPHAGOGASTRODUODENOSCOPY (EGD);  Surgeon: Milus Banister, MD;  Location: [name];  Service: Endoscopy;  Laterality: N/A;  . FLEXIBLE SIGMOIDOSCOPY N/A 03/01/2019   Procedure: FLEXIBLE SIGMOIDOSCOPY;  Surgeon: Ladene Artist, MD;  Location: [facility];  Service: Endoscopy;  Laterality: N/A;  . ILEOSCOPY N/A 03/01/2019   Procedure: ILEOSCOPY THROUGH STOMA;  Surgeon: Ladene Artist, MD;  Location: [facility];  Service: Endoscopy;  Laterality: N/A;  . IR IMAGING GUIDED PORT INSERTION  03/17/2018  . LAPAROSCOPY N/A 08/25/2018   Procedure: LAPAROSCOPY DIAGNOSTIC;  Surgeon: Greer Pickerel, MD;  Location: [facility];  Service: General;  Laterality: N/A;  . TONSILLECTOMY      Social History:  reports that he has never smoked. He has never used smokeless tobacco. He reports current alcohol use. He reports that he does  not use drugs.  Allergies:  Allergies  Allergen Reactions  . Ciprofloxacin Other (See Comments)    Leg tingling    (Not in a hospital admission)    Physical Exam: Blood pressure 122/76, pulse 76, temperature 98.4 F (36.9 C), temperature source Oral, resp. rate 19, height 5' 9"  (1.753 m), weight 88.5 kg, SpO2 100 %. General: pleasant, WD, WN white male who is laying in bed in NAD HEENT: head is normocephalic, atraumatic.  Sclera are noninjected.  PERRL.  Ears and nose without any masses or lesions.  Mouth is pink and moist Heart: regular, rate, and rhythm.  Normal s1,s2. No  obvious murmurs, gallops, or rubs noted.  Palpable radial and pedal pulses bilaterally Lungs: CTAB, no wheezes, rhonchi, or rales noted.  Respiratory effort nonlabored Abd: soft, NT, ND, +BS, no masses, hernias, or organomegaly.  He has a RLQ ileostomy with good output and a viable stoma.  He has a small less than 1 centimeter opening along his midline wound with minimal drainage currently.  This was repacked.  There is mild erythema surrounding this site. MS: all 4 extremities are symmetrical with no cyanosis, clubbing, or edema. Skin: warm and dry with no masses, lesions, or rashes Neuro: Cranial nerves 2-12 grossly intact, sensation is normal throughout Psych: A&Ox3 with an appropriate affect.   Results for orders placed or performed during the hospital encounter of 01/07/20 (from the past 48 hour(s))  Comprehensive metabolic panel     Status: Abnormal   Collection Time: 01/06/20  8:50 PM  Result Value Ref Range   Sodium 140 135 - 145 mmol/L   Potassium 4.1 3.5 - 5.1 mmol/L   Chloride 106 98 - 111 mmol/L   CO2 26 22 - 32 mmol/L   Glucose, Bld 112 (H) 70 - 99 mg/dL    Comment: Glucose reference range applies only to samples taken after fasting for at least 8 hours.   BUN 12 8 - 23 mg/dL   Creatinine, Ser 0.64 0.61 - 1.24 mg/dL   Calcium 9.3 8.9 - 10.3 mg/dL   Total Protein 7.6 6.5 - 8.1 g/dL   Albumin 3.7 3.5 - 5.0 g/dL   AST 20 15 - 41 U/L   ALT 23 0 - 44 U/L   Alkaline Phosphatase 69 38 - 126 U/L   Total Bilirubin 0.5 0.3 - 1.2 mg/dL   GFR calc non Af Amer >60 >60 mL/min   GFR calc Af Amer >60 >60 mL/min   Anion gap 8 5 - 15    Comment: Performed at [facility], [place] [address] [place], [place] [address]  CBC with Differential     Status: Abnormal   Collection Time: 01/06/20  8:50 PM  Result Value Ref Range   WBC 6.6 4.0 - 10.5 K/uL   RBC 3.20 (L) 4.22 - 5.81 MIL/uL   Hemoglobin 10.4 (L) 13.0 - 17.0 g/dL   HCT 33.0 (L) 39.0 - 52.0 %   MCV 103.1 (H) 80.0  - 100.0 fL   MCH 32.5 26.0 - 34.0 pg   MCHC 31.5 30.0 - 36.0 g/dL   RDW 17.2 (H) 11.5 - 15.5 %   Platelets 205 150 - 400 K/uL   nRBC 0.0 0.0 - 0.2 %   Neutrophils Relative % 67 %   Neutro Abs 4.4 1.7 - 7.7 K/uL   Lymphocytes Relative 22 %   Lymphs Abs 1.4 0.7 - 4.0 K/uL   Monocytes Relative 10 %   Monocytes Absolute 0.7  0.1 - 1.0 K/uL   Eosinophils Relative 1 %   Eosinophils Absolute 0.1 0.0 - 0.5 K/uL   Basophils Relative 0 %   Basophils Absolute 0.0 0.0 - 0.1 K/uL   Immature Granulocytes 0 %   Abs Immature Granulocytes 0.02 0.00 - 0.07 K/uL    Comment: Performed at [facility], [place] [address], [place], [place] [address]  Urinalysis, Routine w reflex microscopic     Status: Abnormal   Collection Time: 01/06/20  8:50 PM  Result Value Ref Range   Color, Urine YELLOW YELLOW   APPearance HAZY (A) CLEAR   Specific Gravity, Urine 1.019 1.005 - 1.030   pH 5.0 5.0 - 8.0   Glucose, UA NEGATIVE NEGATIVE mg/dL   Hgb urine dipstick NEGATIVE NEGATIVE   Bilirubin Urine NEGATIVE NEGATIVE   Ketones, ur NEGATIVE NEGATIVE mg/dL   Protein, ur NEGATIVE NEGATIVE mg/dL   Nitrite NEGATIVE NEGATIVE   Leukocytes,Ua NEGATIVE NEGATIVE    Comment: Performed at [place] [address], [place], [place] [address]  Respiratory Panel by RT PCR (Flu A&B, Covid) - Nasopharyngeal Swab     Status: None   Collection Time: 01/07/20  6:26 AM   Specimen: Nasopharyngeal Swab  Result Value Ref Range   SARS Coronavirus 2 by RT PCR NEGATIVE NEGATIVE    Comment: (NOTE) SARS-CoV-2 target nucleic acids are NOT DETECTED. The SARS-CoV-2 RNA is generally detectable in upper respiratoy specimens during the acute phase of infection. The lowest concentration of SARS-CoV-2 viral copies this assay can detect is 131 copies/mL. A negative result does not preclude SARS-Cov-2 infection and should not be used as the sole basis for treatment or other patient management decisions. A  negative result may occur with  improper specimen collection/handling, submission of specimen other than nasopharyngeal swab, presence of viral mutation(s) within the areas targeted by this assay, and inadequate number of viral copies (<131 copies/mL). A negative result must be combined with clinical observations, patient history, and epidemiological information. The expected result is Negative. Fact Sheet for Patients:  PinkCheek.be Fact Sheet for Healthcare Providers:  GravelBags.it This test is not yet ap proved or cleared by the [place] FDA and  has been authorized for detection and/or diagnosis of SARS-CoV-2 by FDA under an Emergency Use Authorization (EUA). This EUA will remain  in effect (meaning this test can be used) for the duration of the COVID-19 declaration under Section 564(b)(1) of the Act, 21 U.S.C. section 360bbb-3(b)(1), unless the authorization is terminated or revoked sooner.    Influenza A by PCR NEGATIVE NEGATIVE   Influenza B by PCR NEGATIVE NEGATIVE    Comment: (NOTE) The Xpert Xpress SARS-CoV-2/FLU/RSV assay is intended as an aid in  the diagnosis of influenza from Nasopharyngeal swab specimens and  should not be used as a sole basis for treatment. Nasal washings and  aspirates are unacceptable for Xpert Xpress SARS-CoV-2/FLU/RSV  testing. Fact Sheet for Patients: PinkCheek.be Fact Sheet for Healthcare Providers: GravelBags.it This test is not yet approved or cleared by the [place] FDA and  has been authorized for detection and/or diagnosis of SARS-CoV-2 by  FDA under an Emergency Use Authorization (EUA). This EUA will remain  in effect (meaning this test can be used) for the duration of the  Covid-19 declaration under Section 564(b)(1) of the Act, 21  U.S.C. section 360bbb-3(b)(1), unless the authorization is  terminated or  revoked. Performed at [facility], [place] [address], [place], [place] [address]  CT ABDOMEN PELVIS W CONTRAST  Result Date: 01/07/2020 CLINICAL DATA:  Purulence drainage from abdominal wall infectious site EXAM: CT ABDOMEN AND PELVIS WITH CONTRAST TECHNIQUE: Multidetector CT imaging of the abdomen and pelvis was performed using the standard protocol following bolus administration of intravenous contrast. CONTRAST:  139m OMNIPAQUE IOHEXOL 300 MG/ML  SOLN COMPARISON:  December 03, 2019 FINDINGS: Lower chest: The visualized heart size within normal limits. No pericardial fluid/thickening. No hiatal hernia. Streaky atelectasis seen at both lung bases. Hepatobiliary: The liver is normal in density without focal abnormality.The main portal vein is patent. No evidence of calcified gallstones, gallbladder wall thickening or biliary dilatation. Again noted is hyperdense nodularity at the gallbladder fundus, likely small polyps. Again noted is a small subcapsular hepatic renal fluid collection measuring 6.9 x 2.9 cm as on the prior exam. A small of amount of fat stranding changes seen extending inferiorly. At the left superior perihepatic space there is a fatty mass again noted measuring approximately 3.1 cm in transverse dimension with surrounding mild fat stranding changes. Pancreas: Unremarkable. No pancreatic ductal dilatation or surrounding inflammatory changes. Spleen: Normal in size without focal abnormality. Adrenals/Urinary Tract: Both adrenal glands appear normal. A 5 mm calculus seen within the right proximal renal pelvis. No left-sided renal stones are seen. No hydronephrosis. The bladder is partially decompressed with diffuse bladder wall thickening. Stomach/Bowel: The stomach is unremarkable. The patient is status post subtotal colectomy with diverting ileostomy within the right lower quadrant. Mildly prominent fluid and air-filled loops of small bowel seen within the mid abdomen.  Vascular/Lymphatic: There are no enlarged mesenteric, retroperitoneal, or pelvic lymph nodes. Scattered aortic atherosclerotic calcifications are seen without aneurysmal dilatation. Reproductive: The prostate is unremarkable. Other: Along the lower anterior abdominal wall there are postsurgical changes present. Again noted is a multilocular fluid collection along the anterior abdominal wall which appears to be low smaller than the prior exam now measuring 3.3 x 1.5 cm which extends just beneath the abdominal fascial layer. There is also 2 smaller intra-abdominal collection which contain small foci of fat and fluid best seen on series 2, image 66 which are smaller than on the prior exam. There are mesenteric fat stranding changes and edema tracking into the deep pelvis. No new fluid collections are seen. Musculoskeletal: No acute or significant osseous findings. IMPRESSION: 1. Status post subtotal colectomy with a right lower quadrant diverting ileostomy. No evidence of bowel obstruction. 2. Postsurgical changes along the lower abdominal wall with a residual small loculated fluid collection extending just beneath the abdominal fascial layer measuring 3.3 x 1.5 cm. This appears decreased in size from the prior exam. There also 2 smaller collections within the left lower pelvis which are smaller from the prior exam. 3. Nonobstructing right renal calculi 4. Unchanged posterior right subcapsular hepatic fluid collection and left subcapsular fat necrosis. Electronically Signed   By: BPrudencio PairM.D.   On: 01/07/2020 05:43      Ass[facility]Plan [facility] with post operative intra-abdominal abscesses  The patient's CT scan shows a smaller fluid collection under his abdominal wall fascia c/w the last collection that was addressed in March 2020.  He appears to be spontaneously draining this collection out of his midline incision.  He also has 2 very small fluid  collections also noted on his CT scan that are intra-abdominal.  He is completely nontender with a normal WBC and no fevers.  His wound can be wicked with dressing changes while it  appears the midline wound is spontaneously draining.  We will let him go home with 14 days of augmentin given this infection is still present from March, it appears.  He would like to go home as well.  I have encouraged his wife to call his surgeon at [facility] to let her know what his CT scan says and what treatment we are doing.  They can then follow up with her for further directions and recommendations.    Henreitta Cea, [facility] 01/07/2020, 8:09 AM Please see Amion for pager number during day hours 7:00am-4:30pm or 7:00am -11:30am on weekends

## 2020-01-07 NOTE — ED Provider Notes (Signed)
[facility] DEPT Provider Note   CSN: [id number] Arrival date & time: 01/06/20  2035     History Chief Complaint  Patient presents with   Post-op Problem    Mark Frey is a 64 y.o. male.  The history is provided by the patient.  Wound Check This is a new problem. The current episode started 12 to 24 hours ago. The problem occurs constantly. The problem has not changed since onset.Pertinent negatives include no chest pain, no abdominal pain, no headaches and no shortness of breath. Nothing aggravates the symptoms. Nothing relieves the symptoms. He has tried nothing for the symptoms. The treatment provided no relief.  Patient who is s/p colostomy with a wound that is healing by secondary intention presents with redness of the skin and purulent drainage from the wound that started earlier in the day.  No f/c/r.  No change in ostomy output.       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) 02/22/2019   Thrombocytopenia ([place])    Symptomatic anemia    Atrophy of muscle of multiple sites    Goals of care, counseling/discussion    Hypomagnesemia 01/11/2019   Neutropenia ([place]) 01/11/2019   Neutropenia with fever ([place])    Pressure injury of skin 01/07/2019   Zinc deficiency    Radiation gastroenteritis    Radiation colitis    Hyponatremia 01/01/2019   Pancytopenia ([place]) 01/01/2019   Hematochezia    Loop ilesotomy for fecal diversion of sigmoid stricture 08/25/2018   Severe protein-calorie  malnutrition ([place]) 08/19/2018   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   Low magnesium level 07/30/2018   Bowel obstruction in setting of lymphoma and sigmoid stricture 07/21/2018   Edema    History of deep venous thrombosis (DVT) of distal vein of right lower extremity    Counseling regarding advance care planning and goals of care 04/25/2018   Gastrointestinal hemorrhage    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   Port-A-Cath in place 03/27/2018   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   Bilateral pulmonary embolism ([place]) 03/10/2018   Occult blood in stools 03/10/2018   Tachycardia 01/31/2017   Controlled diabetes mellitus type II without complication ([place]) 82/42/3536   Hypertension 06/05/2014   Irritable bowel syndrome 14/43/1540   Dysmetabolic syndrome X 08/67/6195   BACK PAIN WITH RADICULOPATHY 06/09/2007   Hyperlipidemia 05/12/2007   ALLERGIC RHINITIS 05/12/2007    Past Surgical History:  Procedure Laterality Date   BIOPSY  03/15/2018   Procedure: BIOPSY;  Surgeon: Milus Banister, MD;  Location: [facility];  Service: Endoscopy;;   BIOPSY  03/01/2019   Procedure: BIOPSY;  Surgeon: Ladene Artist, MD;  Location: [facility];  Service: Endoscopy;;   BOWEL RESECTION N/A 08/25/2018   Procedure: LAPROSCOPIC LOOP ILEOSTOMY;  Surgeon: Greer Pickerel, MD;  Location: [name];  Service: General;  Laterality: N/A;   CLEFT PALATE REPAIR     COLONOSCOPY N/A 03/15/2018   Procedure: COLONOSCOPY;  Surgeon: Milus Banister, MD;  Location:  [facility];  Service: Endoscopy;  Laterality: N/A;   COLONOSCOPY N/A 08/20/2018   Procedure: COLONOSCOPY;  Surgeon: Irene Shipper, MD;  Location: [facility];  Service: Endoscopy;  Laterality: N/A;   deviated septum repair     slight improvement   ESOPHAGOGASTRODUODENOSCOPY N/A 03/15/2018   Procedure: ESOPHAGOGASTRODUODENOSCOPY (EGD);  Surgeon: Milus Banister, MD;  Location: [name];  Service: Endoscopy;  Laterality: N/A;   FLEXIBLE SIGMOIDOSCOPY N/A 03/01/2019   Procedure: FLEXIBLE SIGMOIDOSCOPY;  Surgeon: Ladene Artist, MD;  Location: [facility];  Service: Endoscopy;  Laterality: N/A;   ILEOSCOPY N/A 03/01/2019   Procedure: ILEOSCOPY THROUGH STOMA;  Surgeon: Ladene Artist, MD;  Location: [facility];  Service: Endoscopy;  Laterality: N/A;   IR IMAGING GUIDED PORT INSERTION  03/17/2018   LAPAROSCOPY N/A 08/25/2018   Procedure: LAPAROSCOPY DIAGNOSTIC;  Surgeon: Greer Pickerel, MD;  Location: [facility];  Service: General;  Laterality: N/A;   TONSILLECTOMY         Family History  Problem Relation Age of Onset   Lung cancer Mother        smoker   Brain cancer Mother        metastasis   AAA (abdominal aortic aneurysm) Father        smoker    Social History   Tobacco Use   Smoking status: Never Smoker   Smokeless tobacco: Never Used  Substance Use Topics   Alcohol use: Yes    Comment: occasional   Drug use: No    Home Medications Prior to Admission medications   Medication Sig Start Date End Date Taking? Authorizing Provider  acetaminophen (TYLENOL) 325 MG tablet Take 2 tablets (650 mg total) by mouth every 6 (six) hours as needed for mild pain (or temp > 100). 12/04/19  Yes Barkley Boards R, PA-C  B Complex Vitamins (VITAMIN B COMPLEX PO) Take 1 tablet by mouth daily.    Yes [provider]  blood glucose meter kit and supplies Dispense based on patient and insurance preference. Use daily as directed. (E11.9). 03/31/18  Yes [name], MD  citalopram (CELEXA) 20 MG tablet Take 1 tablet (20 mg total) by mouth daily. 04/17/19 04/11/20 Yes [name], MD  glucose blood (FREESTYLE TEST STRIPS) test strip Use to check blood sugar daily 03/30/18  Yes [name], MD  loperamide (IMODIUM) 2 MG capsule Take 2 mg by mouth as needed for diarrhea or loose stools.   Yes [provider]  Magnesium 100 MG TABS Take  150 mg by mouth in the morning, at noon, and at bedtime.   Yes [provider]    Allergies    Ciprofloxacin  Review of Systems   Review of Systems  Constitutional: Negative for fever.  HENT: Negative for congestion.   Eyes: Negative for visual disturbance.  Respiratory: Negative for shortness of breath.   Cardiovascular: Negative for chest pain.  Gastrointestinal: Negative for abdominal pain.  Genitourinary: Negative for difficulty urinating.  Musculoskeletal: Negative for arthralgias.  Skin: Positive for wound. Negative for color change.  Neurological: Negative for headaches.  All other systems reviewed and are negative.   Physical Exam Updated Vital Signs BP 126/74    Pulse 82    Temp 98.4 F (36.9 C) (Oral)    Resp 16    Ht _0  (1.753 m)    Wt 88.5 kg    SpO2 100%    BMI 28.80 kg/m   Physical Exam Vitals reviewed.  Constitutional:  General: He is not in acute distress.    Appearance: Normal appearance.  HENT:     Head: Normocephalic and atraumatic.     Nose: Nose normal.  Eyes:     Conjunctiva/sclera: Conjunctivae normal.     Pupils: Pupils are equal, round, and reactive to light.  Cardiovascular:     Rate and Rhythm: Normal rate and regular rhythm.     Pulses: Normal pulses.     Heart sounds: Normal heart sounds.  Pulmonary:     Effort: Pulmonary effort is normal.     Breath sounds: Normal breath sounds.  Abdominal:     Tenderness: There is no abdominal tenderness. There is no guarding or rebound.  Musculoskeletal:        General: Normal range of motion.     Cervical back: Normal range of motion and neck supple.  Skin:    General: Skin is warm and dry.     Capillary Refill: Capillary refill takes less than 2 seconds.     Findings: Erythema present.       Neurological:     General: No focal deficit present.     Mental Status: He is alert and oriented to person, place, and time.     Deep Tendon Reflexes: Reflexes normal.  Psychiatric:          Mood and Affect: Mood normal.        Behavior: Behavior normal.     ED Results / Procedures / Treatments   Labs (all labs ordered are listed, but only abnormal results are displayed) Results for orders placed or performed during the hospital encounter of 01/07/20  Comprehensive metabolic panel  Result Value Ref Range   Sodium 140 135 - 145 mmol/L   Potassium 4.1 3.5 - 5.1 mmol/L   Chloride 106 98 - 111 mmol/L   CO2 26 22 - 32 mmol/L   Glucose, Bld 112 (H) 70 - 99 mg/dL   BUN 12 8 - 23 mg/dL   Creatinine, Ser 0.64 0.61 - 1.24 mg/dL   Calcium 9.3 8.9 - 10.3 mg/dL   Total Protein 7.6 6.5 - 8.1 g/dL   Albumin 3.7 3.5 - 5.0 g/dL   AST 20 15 - 41 U/L   ALT 23 0 - 44 U/L   Alkaline Phosphatase 69 38 - 126 U/L   Total Bilirubin 0.5 0.3 - 1.2 mg/dL   GFR calc non Af Amer >60 >60 mL/min   GFR calc Af Amer >60 >60 mL/min   Anion gap 8 5 - 15  CBC with Differential  Result Value Ref Range   WBC 6.6 4.0 - 10.5 K/uL   RBC 3.20 (L) 4.22 - 5.81 MIL/uL   Hemoglobin 10.4 (L) 13.0 - 17.0 g/dL   HCT 33.0 (L) 39.0 - 52.0 %   MCV 103.1 (H) 80.0 - 100.0 fL   MCH 32.5 26.0 - 34.0 pg   MCHC 31.5 30.0 - 36.0 g/dL   RDW 17.2 (H) 11.5 - 15.5 %   Platelets 205 150 - 400 K/uL   nRBC 0.0 0.0 - 0.2 %   Neutrophils Relative % 67 %   Neutro Abs 4.4 1.7 - 7.7 K/uL   Lymphocytes Relative 22 %   Lymphs Abs 1.4 0.7 - 4.0 K/uL   Monocytes Relative 10 %   Monocytes Absolute 0.7 0.1 - 1.0 K/uL   Eosinophils Relative 1 %   Eosinophils Absolute 0.1 0.0 - 0.5 K/uL   Basophils Relative 0 %   Basophils Absolute  0.0 0.0 - 0.1 K/uL   Immature Granulocytes 0 %   Abs Immature Granulocytes 0.02 0.00 - 0.07 K/uL  Urinalysis, Routine w reflex microscopic  Result Value Ref Range   Color, Urine YELLOW YELLOW   APPearance HAZY (A) CLEAR   Specific Gravity, Urine 1.019 1.005 - 1.030   pH 5.0 5.0 - 8.0   Glucose, UA NEGATIVE NEGATIVE mg/dL   Hgb urine dipstick NEGATIVE NEGATIVE   Bilirubin Urine NEGATIVE  NEGATIVE   Ketones, ur NEGATIVE NEGATIVE mg/dL   Protein, ur NEGATIVE NEGATIVE mg/dL   Nitrite NEGATIVE NEGATIVE   Leukocytes,Ua NEGATIVE NEGATIVE   CT ABDOMEN PELVIS W CONTRAST  Result Date: 01/07/2020 CLINICAL DATA:  Purulence drainage from abdominal wall infectious site EXAM: CT ABDOMEN AND PELVIS WITH CONTRAST TECHNIQUE: Multidetector CT imaging of the abdomen and pelvis was performed using the standard protocol following bolus administration of intravenous contrast. CONTRAST:  136m OMNIPAQUE IOHEXOL 300 MG/ML  SOLN COMPARISON:  December 03, 2019 FINDINGS: Lower chest: The visualized heart size within normal limits. No pericardial fluid/thickening. No hiatal hernia. Streaky atelectasis seen at both lung bases. Hepatobiliary: The liver is normal in density without focal abnormality.The main portal vein is patent. No evidence of calcified gallstones, gallbladder wall thickening or biliary dilatation. Again noted is hyperdense nodularity at the gallbladder fundus, likely small polyps. Again noted is a small subcapsular hepatic renal fluid collection measuring 6.9 x 2.9 cm as on the prior exam. A small of amount of fat stranding changes seen extending inferiorly. At the left superior perihepatic space there is a fatty mass again noted measuring approximately 3.1 cm in transverse dimension with surrounding mild fat stranding changes. Pancreas: Unremarkable. No pancreatic ductal dilatation or surrounding inflammatory changes. Spleen: Normal in size without focal abnormality. Adrenals/Urinary Tract: Both adrenal glands appear normal. A 5 mm calculus seen within the right proximal renal pelvis. No left-sided renal stones are seen. No hydronephrosis. The bladder is partially decompressed with diffuse bladder wall thickening. Stomach/Bowel: The stomach is unremarkable. The patient is status post subtotal colectomy with diverting ileostomy within the right lower quadrant. Mildly prominent fluid and air-filled loops  of small bowel seen within the mid abdomen. Vascular/Lymphatic: There are no enlarged mesenteric, retroperitoneal, or pelvic lymph nodes. Scattered aortic atherosclerotic calcifications are seen without aneurysmal dilatation. Reproductive: The prostate is unremarkable. Other: Along the lower anterior abdominal wall there are postsurgical changes present. Again noted is a multilocular fluid collection along the anterior abdominal wall which appears to be low smaller than the prior exam now measuring 3.3 x 1.5 cm which extends just beneath the abdominal fascial layer. There is also 2 smaller intra-abdominal collection which contain small foci of fat and fluid best seen on series 2, image 66 which are smaller than on the prior exam. There are mesenteric fat stranding changes and edema tracking into the deep pelvis. No new fluid collections are seen. Musculoskeletal: No acute or significant osseous findings. IMPRESSION: 1. Status post subtotal colectomy with a right lower quadrant diverting ileostomy. No evidence of bowel obstruction. 2. Postsurgical changes along the lower abdominal wall with a residual small loculated fluid collection extending just beneath the abdominal fascial layer measuring 3.3 x 1.5 cm. This appears decreased in size from the prior exam. There also 2 smaller collections within the left lower pelvis which are smaller from the prior exam. 3. Nonobstructing right renal calculi 4. Unchanged posterior right subcapsular hepatic fluid collection and left subcapsular fat necrosis. Electronically Signed   By: BPrudencio Pair  M.D.   On: 01/07/2020 05:43    Radiology CT ABDOMEN PELVIS W CONTRAST  Result Date: 01/07/2020 CLINICAL DATA:  Purulence drainage from abdominal wall infectious site EXAM: CT ABDOMEN AND PELVIS WITH CONTRAST TECHNIQUE: Multidetector CT imaging of the abdomen and pelvis was performed using the standard protocol following bolus administration of intravenous contrast. CONTRAST:  188m  OMNIPAQUE IOHEXOL 300 MG/ML  SOLN COMPARISON:  December 03, 2019 FINDINGS: Lower chest: The visualized heart size within normal limits. No pericardial fluid/thickening. No hiatal hernia. Streaky atelectasis seen at both lung bases. Hepatobiliary: The liver is normal in density without focal abnormality.The main portal vein is patent. No evidence of calcified gallstones, gallbladder wall thickening or biliary dilatation. Again noted is hyperdense nodularity at the gallbladder fundus, likely small polyps. Again noted is a small subcapsular hepatic renal fluid collection measuring 6.9 x 2.9 cm as on the prior exam. A small of amount of fat stranding changes seen extending inferiorly. At the left superior perihepatic space there is a fatty mass again noted measuring approximately 3.1 cm in transverse dimension with surrounding mild fat stranding changes. Pancreas: Unremarkable. No pancreatic ductal dilatation or surrounding inflammatory changes. Spleen: Normal in size without focal abnormality. Adrenals/Urinary Tract: Both adrenal glands appear normal. A 5 mm calculus seen within the right proximal renal pelvis. No left-sided renal stones are seen. No hydronephrosis. The bladder is partially decompressed with diffuse bladder wall thickening. Stomach/Bowel: The stomach is unremarkable. The patient is status post subtotal colectomy with diverting ileostomy within the right lower quadrant. Mildly prominent fluid and air-filled loops of small bowel seen within the mid abdomen. Vascular/Lymphatic: There are no enlarged mesenteric, retroperitoneal, or pelvic lymph nodes. Scattered aortic atherosclerotic calcifications are seen without aneurysmal dilatation. Reproductive: The prostate is unremarkable. Other: Along the lower anterior abdominal wall there are postsurgical changes present. Again noted is a multilocular fluid collection along the anterior abdominal wall which appears to be low smaller than the prior exam now  measuring 3.3 x 1.5 cm which extends just beneath the abdominal fascial layer. There is also 2 smaller intra-abdominal collection which contain small foci of fat and fluid best seen on series 2, image 66 which are smaller than on the prior exam. There are mesenteric fat stranding changes and edema tracking into the deep pelvis. No new fluid collections are seen. Musculoskeletal: No acute or significant osseous findings. IMPRESSION: 1. Status post subtotal colectomy with a right lower quadrant diverting ileostomy. No evidence of bowel obstruction. 2. Postsurgical changes along the lower abdominal wall with a residual small loculated fluid collection extending just beneath the abdominal fascial layer measuring 3.3 x 1.5 cm. This appears decreased in size from the prior exam. There also 2 smaller collections within the left lower pelvis which are smaller from the prior exam. 3. Nonobstructing right renal calculi 4. Unchanged posterior right subcapsular hepatic fluid collection and left subcapsular fat necrosis. Electronically Signed   By: BPrudencio PairM.D.   On: 01/07/2020 05:43    Procedures Procedures (including critical care time)  Medications Ordered in ED Medications  sodium chloride (PF) 0.9 % injection (has no administration in time range)  vancomycin (VANCOCIN) IVPB 1000 mg/200 mL premix (0 mg Intravenous Stopped 01/07/20 0546)  piperacillin-tazobactam (ZOSYN) IVPB 3.375 g (0 g Intravenous Stopped 01/07/20 0420)  iohexol (OMNIPAQUE) 300 MG/ML solution 100 mL (100 mLs Intravenous Contrast Given 01/07/20 [id number]    ED Course  I have reviewed the triage vital signs and the nursing notes.  Pertinent labs &  imaging results that were available during my care of the patient were reviewed by me and considered in my medical decision making (see chart for details).   Final Clinical Impression(s) / ED Diagnoses Final diagnoses:  Cellulitis of abdominal wall  Wound infection after surgery   620 Case d/w  general surgery Dr. Lucia Gaskins, surgery to consult   Admit to medicine, case d/w Dr. Hal Hope for wound infection    Randal Buba, Chee Kinslow, MD 01/07/20 [id number]

## 2020-01-08 ENCOUNTER — Other Ambulatory Visit: Payer: Self-pay | Admitting: *Deleted

## 2020-01-08 DIAGNOSIS — C8510 Unspecified B-cell lymphoma, unspecified site: Secondary | ICD-10-CM

## 2020-01-09 ENCOUNTER — Telehealth: Payer: Self-pay | Admitting: Hematology

## 2020-01-09 ENCOUNTER — Inpatient Hospital Stay: Payer: 59

## 2020-01-09 ENCOUNTER — Inpatient Hospital Stay: Payer: 59 | Attending: Hematology | Admitting: Hematology

## 2020-01-09 DIAGNOSIS — C8378 Burkitt lymphoma, lymph nodes of multiple sites: Secondary | ICD-10-CM | POA: Insufficient documentation

## 2020-01-09 DIAGNOSIS — Z7901 Long term (current) use of anticoagulants: Secondary | ICD-10-CM | POA: Insufficient documentation

## 2020-01-09 DIAGNOSIS — Z86718 Personal history of other venous thrombosis and embolism: Secondary | ICD-10-CM | POA: Insufficient documentation

## 2020-01-09 DIAGNOSIS — Z86711 Personal history of pulmonary embolism: Secondary | ICD-10-CM | POA: Insufficient documentation

## 2020-01-09 DIAGNOSIS — K520 Gastroenteritis and colitis due to radiation: Secondary | ICD-10-CM

## 2020-01-09 DIAGNOSIS — Z9221 Personal history of antineoplastic chemotherapy: Secondary | ICD-10-CM | POA: Insufficient documentation

## 2020-01-09 NOTE — Progress Notes (Signed)
HEMATOLOGY/ONCOLOGY CLINIC NOTE  Date of Service: 01/09/20    Patient Care Team: [name], MD as PCP - General (Family Medicine)  CHIEF COMPLAINTS/PURPOSE OF CONSULTATION:  F/u for continued Mx of Burkitts lymphoma  HISTORY OF PRESENTING ILLNESS:   Mark Frey is a wonderful 64 y.o. male who has been referred to [place] by my colleague Dr. 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 He is accompanied today by his wife. The pt reports that he is doing well overall.   The pt started R-EPOCH every 3 weeks with neulasta support on 03/17/18. He presented to the ED with right leg swelling, found to be a DVT and bilateral PE, which resulted in the incidental finding of a lower abdomen tumor measuring 18.7 x 18.5 x 17.8 cm. He denies any abdominal pains or changes in his bowel habits prior to this finding. He first noted blood in the stools 4-5 days prior to appearing to the ED.  The pt reports that he has been taking 16m Lovenox injections once each day, except for the days in which he had blood in the stools. He hasn't had blood in his stools for the last 7 days.   He has had GI bleed related to bowel involvement with his lymphoma - this is now resolving and his hgb is stabilizing.  He notes that his scrotum and leg swelling has decreased. He denies having CP or SOB at any point from his PE.   Most recent lab results (04/06/18) of CBC w/diff, CMP, Reticulocytes  is as follows: all values are WNL except for WBC at 15.3k, RBC at 3.65, HGB at 10.5, HCT at 31.4, RDW at 17.8, PLT at 640k, ANC at 12.7k, Monocytes abs at 1.6k, Glucose at 162, Total Protein at 6.2, Albumin at 3.1, Total bilirubin at <0.2, Retic ct pct at 2.5%. Uric acid 04/06/18 was low at 3.0 LDH 04/06/18 elevated at 251  On review of systems, pt reports remaining right leg swelling, blood in the stools 7 days ago, two  bowel movements each day, left leg swelling, improved scrotal swelling, eating well, and denies problems with his port, abdominal pains, constipation, diarrhea, jaw pain, pain along the spine, problems passing urine, and any other symptoms.  Interval History:   I connected with  DAneta Minson 01/09/20 by telephone and verified that I am speaking with the correct person using two identifiers.   I discussed the limitations of evaluation and management by telemedicine. The patient expressed understanding and agreed to proceed.  Other persons participating in the visit and their role in the encounter:        -JYevette Edwards MHolland Pt's wife  Patient's location: Home [facility]. The patient's last visit with [facility] on 07/12/2019. The patient's last visit with [facility] on 10/11/2019. The pt reports that he is doing well overall.  The pt reports that he had an abscess at his surgical site after his total abdominal colectomy with end ileostomy. Pt has been cleaning and packing the wound for the last month. He was seen in the hospital a few days ago and they found that his wound is still draining. Pt was placed on Amoxicillin for two weeks.   Pt has had a  good appetite and is moving about as much as possible. He has been working with PT and is now able to walk without a cane. He has been having some increased ankle swelling for the last week or two.   He is currently taking po Magnesium per day.   Lab results today (01/06/20) of CBC w/diff and CMP is as follows: all values are WNL except for RBC at 3.20, Hgb at 10.4, HCT at 33.0, MCV at 103.1, RDW at 17.2, Glucose at 112.  On review of systems, pt reports ankle swelling and denies low appetite, fevers, chills, night sweats, unexpected weight loss and any other symptoms.   MEDICAL HISTORY:  Past Medical History:  Diagnosis  Date  . ALLERGIC RHINITIS   . Cancer ([place])    Lymphoma   . Diabetes mellitus   . Hyperlipidemia     SURGICAL HISTORY: Past Surgical History:  Procedure Laterality Date  . BIOPSY  03/15/2018   Procedure: BIOPSY;  Surgeon: Milus Banister, MD;  Location: [name];  Service: Endoscopy;;  . BIOPSY  03/01/2019   Procedure: BIOPSY;  Surgeon: Ladene Artist, MD;  Location: [facility];  Service: Endoscopy;;  . BOWEL RESECTION N/A 08/25/2018   Procedure: LAPROSCOPIC LOOP ILEOSTOMY;  Surgeon: Greer Pickerel, MD;  Location: [facility];  Service: General;  Laterality: N/A;  . CLEFT PALATE REPAIR    . COLONOSCOPY N/A 03/15/2018   Procedure: COLONOSCOPY;  Surgeon: Milus Banister, MD;  Location: [facility];  Service: Endoscopy;  Laterality: N/A;  . COLONOSCOPY N/A 08/20/2018   Procedure: COLONOSCOPY;  Surgeon: Irene Shipper, MD;  Location: [facility];  Service: Endoscopy;  Laterality: N/A;  . deviated septum repair     slight improvement  . ESOPHAGOGASTRODUODENOSCOPY N/A 03/15/2018   Procedure: ESOPHAGOGASTRODUODENOSCOPY (EGD);  Surgeon: Milus Banister, MD;  Location: [name];  Service: Endoscopy;  Laterality: N/A;  . FLEXIBLE SIGMOIDOSCOPY N/A 03/01/2019   Procedure: FLEXIBLE SIGMOIDOSCOPY;  Surgeon: Ladene Artist, MD;  Location: [facility];  Service: Endoscopy;  Laterality: N/A;  . ILEOSCOPY N/A 03/01/2019   Procedure: ILEOSCOPY THROUGH STOMA;  Surgeon: Ladene Artist, MD;  Location: [facility];  Service: Endoscopy;  Laterality: N/A;  . IR IMAGING GUIDED PORT INSERTION  03/17/2018  . LAPAROSCOPY N/A 08/25/2018   Procedure: LAPAROSCOPY DIAGNOSTIC;  Surgeon: Greer Pickerel, MD;  Location: [facility];  Service: General;  Laterality: N/A;  . TONSILLECTOMY      SOCIAL HISTORY: Social History   Socioeconomic History  . Marital status: Married    Spouse name: lisa  . Number of children: 0  . Years of education: Not on file  . Highest education level: Not on file  Occupational History    . Not on file  Tobacco Use  . Smoking status: Never Smoker  . Smokeless tobacco: Never Used  Substance and Sexual Activity  . Alcohol use: Yes    Comment: occasional  . Drug use: No  . Sexual activity: Yes  Other Topics Concern  . Not on file  Social History Narrative   Married 1985. No kids. 4 small dogs.       Works in Financial trader, residential      Hobbies: work on cars, Haematologist, exercise as able   Social Determinants of Radio broadcast assistant Strain:   . Difficulty of Paying Living Expenses:   Food Insecurity:   . Worried About Charity fundraiser in the Last Year:   . Arboriculturist in  the Last Year:   Transportation Needs:   . Film/video editor (Medical):   [name] Lack of Transportation (Non-Medical):   Physical Activity:   . Days of Exercise per Week:   . Minutes of Exercise per Session:   Stress:   . Feeling of Stress :   Social Connections:   . Frequency of Communication with Friends and Family:   . Frequency of Social Gatherings with Friends and Family:   . Attends Religious Services:   . Active Member of Clubs or Organizations:   . Attends Archivist Meetings:   [name] Marital Status:   Intimate Partner Violence:   . Fear of Current or Ex-Partner:   . Emotionally Abused:   [name] Physically Abused:   . Sexually Abused:     FAMILY HISTORY: Family History  Problem Relation Age of Onset  . Lung cancer Mother        smoker  . Brain cancer Mother        metastasis  . AAA (abdominal aortic aneurysm) Father        smoker    ALLERGIES:  is allergic to ciprofloxacin.  MEDICATIONS:  Current Outpatient Medications  Medication Sig Dispense Refill  . acetaminophen (TYLENOL) 325 MG tablet Take 2 tablets (650 mg total) by mouth every 6 (six) hours as needed for mild pain (or temp > 100).    [name] amoxicillin-clavulanate (AUGMENTIN) 875-125 MG tablet Take 1 tablet by mouth 2 (two) times daily for 14 days. 28 tablet 0  . B Complex Vitamins  (VITAMIN B COMPLEX PO) Take 1 tablet by mouth daily.     . blood glucose meter kit and supplies Dispense based on patient and insurance preference. Use daily as directed. (E11.9). 1 each 0  . citalopram (CELEXA) 20 MG tablet Take 1 tablet (20 mg total) by mouth daily. 90 tablet 3  . glucose blood (FREESTYLE TEST STRIPS) test strip Use to check blood sugar daily 100 each 4  . loperamide (IMODIUM) 2 MG capsule Take 2 mg by mouth as needed for diarrhea or loose stools.    . Magnesium 100 MG TABS Take 150 mg by mouth in the morning, at noon, and at bedtime.     No current facility-administered medications for this visit.    REVIEW OF SYSTEMS:   A 10+ POINT REVIEW OF SYSTEMS WAS OBTAINED including neurology, dermatology, psychiatry, cardiac, respiratory, lymph, extremities, GI, GU, Musculoskeletal, constitutional, breasts, reproductive, HEENT.  All pertinent positives are noted in the HPI.  All others are negative.   PHYSICAL EXAMINATION: ECOG FS:2 - Symptomatic, <50% confined to bed  There were no vitals filed for this visit. Wt Readings from Last 3 Encounters:  01/06/20 195 lb (88.5 kg)  12/03/19 185 lb (83.9 kg)  10/11/19 172 lb 14.4 oz (78.4 kg)   There is no height or weight on file to calculate BMI.    Telehealth visit  LABORATORY DATA:   CBC Latest Ref Rng & Units 01/06/2020 12/04/2019 12/03/2019  WBC 4.0 - 10.5 K/uL 6.6 5.9 11.6(H)  Hemoglobin 13.0 - 17.0 g/dL 10.4(L) 8.9(L) 9.0(L)  Hematocrit 39.0 - 52.0 % 33.0(L) 28.9(L) 29.0(L)  Platelets 150 - 400 K/uL 205 216 233   ANC 0--->4.8k  CMP Latest Ref Rng & Units 01/06/2020 12/04/2019 12/03/2019  Glucose 70 - 99 mg/dL 112(H) 114(H) -  BUN 8 - 23 mg/dL 12 9 -  Creatinine 0.61 - 1.24 mg/dL 0.64 0.60(L) 0.70  Sodium 135 - 145 mmol/L 140 136 -  Potassium 3.5 - 5.1 mmol/L 4.1 3.6 -  Chloride 98 - 111 mmol/L 106 99 -  CO2 22 - 32 mmol/L 26 28 -  Calcium 8.9 - 10.3 mg/dL 9.3 8.5(L) -  Total Protein 6.5 - 8.1 g/dL 7.6 - -  Total  Bilirubin 0.3 - 1.2 mg/dL 0.5 - -  Alkaline Phos 38 - 126 U/L 69 - -  AST 15 - 41 U/L 20 - -  ALT 0 - 44 U/L 23 - -   Lab Results  Component Value Date   LDH 101 10/11/2019    03/01/19 Colonoscopy biopsies:     03/17/18 Cytogenetics:   03/17/18 Colon Bx:   03/13/18 Peritoneum Biopsy:     07/02/2019 PORTABLE CHEST 1 VIEW (Accession [id number])    07/02/2019 CT ABDOMEN AND PELVIS WITH CONTRAST (Accession [id number])  RADIOGRAPHIC STUDIES:  I have personally reviewed the radiological images as listed and agreed with the findings in the report. CT ABDOMEN PELVIS W CONTRAST  Result Date: 01/07/2020 CLINICAL DATA:  Purulence drainage from abdominal wall infectious site EXAM: CT ABDOMEN AND PELVIS WITH CONTRAST TECHNIQUE: Multidetector CT imaging of the abdomen and pelvis was performed using the standard protocol following bolus administration of intravenous contrast. CONTRAST:  144m OMNIPAQUE IOHEXOL 300 MG/ML  SOLN COMPARISON:  December 03, 2019 FINDINGS: Lower chest: The visualized heart size within normal limits. No pericardial fluid/thickening. No hiatal hernia. Streaky atelectasis seen at both lung bases. Hepatobiliary: The liver is normal in density without focal abnormality.The main portal vein is patent. No evidence of calcified gallstones, gallbladder wall thickening or biliary dilatation. Again noted is hyperdense nodularity at the gallbladder fundus, likely small polyps. Again noted is a small subcapsular hepatic renal fluid collection measuring 6.9 x 2.9 cm as on the prior exam. A small of amount of fat stranding changes seen extending inferiorly. At the left superior perihepatic space there is a fatty mass again noted measuring approximately 3.1 cm in transverse dimension with surrounding mild fat stranding changes. Pancreas: Unremarkable. No pancreatic ductal dilatation or surrounding inflammatory changes. Spleen: Normal in size without focal abnormality. Adrenals/Urinary Tract:  Both adrenal glands appear normal. A 5 mm calculus seen within the right proximal renal pelvis. No left-sided renal stones are seen. No hydronephrosis. The bladder is partially decompressed with diffuse bladder wall thickening. Stomach/Bowel: The stomach is unremarkable. The patient is status post subtotal colectomy with diverting ileostomy within the right lower quadrant. Mildly prominent fluid and air-filled loops of small bowel seen within the mid abdomen. Vascular/Lymphatic: There are no enlarged mesenteric, retroperitoneal, or pelvic lymph nodes. Scattered aortic atherosclerotic calcifications are seen without aneurysmal dilatation. Reproductive: The prostate is unremarkable. Other: Along the lower anterior abdominal wall there are postsurgical changes present. Again noted is a multilocular fluid collection along the anterior abdominal wall which appears to be low smaller than the prior exam now measuring 3.3 x 1.5 cm which extends just beneath the abdominal fascial layer. There is also 2 smaller intra-abdominal collection which contain small foci of fat and fluid best seen on series 2, image 66 which are smaller than on the prior exam. There are mesenteric fat stranding changes and edema tracking into the deep pelvis. No new fluid collections are seen. Musculoskeletal: No acute or significant osseous findings. IMPRESSION: 1. Status post subtotal colectomy with a right lower quadrant diverting ileostomy. No evidence of bowel obstruction. 2. Postsurgical changes along the lower abdominal wall with a residual small loculated fluid collection extending just beneath the abdominal fascial layer measuring  3.3 x 1.5 cm. This appears decreased in size from the prior exam. There also 2 smaller collections within the left lower pelvis which are smaller from the prior exam. 3. Nonobstructing right renal calculi 4. Unchanged posterior right subcapsular hepatic fluid collection and left subcapsular fat necrosis.  Electronically Signed   By: Prudencio Pair M.D.   On: 01/07/2020 05:43    ASSESSMENT & PLAN:   64 y.o. male with  1. 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 03/10/18 CT A/P revealed Large irregular infiltrative solid mass in the right lower quadrant measuring up to 18.7 x 18.5 x 17.8 cm, infiltrating and encasing multiple distal small bowel loops and likely the ileocecal region, partially encasing the sigmoid colon, with prominent extension into the right lower retroperitoneum and extraperitoneal right pelvis with encasement of right external iliac and proximal right common iliac vasculature and infiltration of the right iliopsoas muscle. No BM or CNS involvement.   04/18/18 PET/CT revealed Continued improvement in right colonic wall thickening and surrounding bulky masses consistent with treated lymphoma. No hypermetabolic activity to suggest residual tumor. There is some hypermetabolic activity associated with the sigmoid colon which demonstrates mild wall thickening, surrounding inflammation and underlying diverticulosis, suggesting mild diverticulitis. Suspected treatment related changes throughout the bone marrow. There is mildly increased activity within the clivus without clear corresponding finding on the CT images. Attention on follow-up recommended. Nonobstructing right renal calculi. 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   06/09/18 PET/CT revealedSoft tissue lesion within the ventral pelvis is again identified demonstrating mild to moderate increased uptake within SUV max of 5.61. Deauville criteria 4. Concerning for residual metabolically active tumor. 2. Similar appearance of diffusely thickened appendix within the right lower quadrant of the abdomen. Findings may reflect treatment related changes. 3. Increased radiotracer uptake throughout the bone marrow which is favored to represent treatment related changes. 4.  Lad coronary artery atherosclerotic calcifications.   patient completed planned chemorx on 10/21  10/05/18 CT A/P revealed Persistent wall thickening in the sigmoid colon although a component of this could be due to diverticulosis. There is also a suggestion of wall thickening and potential mucosal enhancement in the terminal ileum, as well as adjacent indistinct stranding in the mesenteric and omental adipose tissues roughly similar to the prior exam. Some of this may be treatment related. Inflammatory process such as Crohn's disease not excluded. 2. No current adenopathy. 3. Other imaging findings of potential clinical significance: Nonobstructive right nephrolithiasis. Aortic Atherosclerosis. Moderate prostatomegaly. Left foraminal impingement at L4-5.  S/p IMRT with 36 Gy over 20 fractions, between 11/09/18 and 12/06/18 with Dr. Isidore Moos  05/24/2019 CT CAP revealed "1. Overall little interval change from CT 05/09/2019. Persistent inflamed loop of distal sigmoid colon with submucosal edema. Potential fistulous communication versus tethering to the ascending colon and a loop of small bowel. Findings are suggestive of inflammatory bowel disease. 2. No evidence of bowel obstruction. Oral contrast enters the ileostomy bag with bowel obstruction or small bowel dilatation."  2.H/oRLE DVT and b/l PEon anticoagulation.   3. Small bowel obstruction/ileus with sigmoid thickening/stenosis .  CT scan - sigmoid obstructive lesion ? Scar tissue from resolved lymphoma vs residual lymphoma vs post inflammatory scarring from diverticulitis/limited perforation. Also has SBO from adhesions. Colonoscopy 08/21/2018 - no intramucosal lesions. Extrinsic compression of sigmoid colon. S/p diverting ileostomy on 08/25/18 with Dr. Greer Pickerel  4.  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  GI panel and  C. difficile negative.  CMV IgM negative, EBV IgM negative. 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 Recurrent abscesses and sepsis. Now s/p colectomy  --surgical pathology with no residual lymphoma.  5. Short gut syndrome with high stool output.  6. H/o hypomagnesemia - on replacement.  6. S/p  Pancytopenia   PLAN: -Discussed pt labwork, 01/06/20; blood counts are steady, blood chemistries are nml -The pt shows no lab or clinical evidence of recurrence of his Burkitt's Lymphoma at this time.  -Pt is currently 18 months (October 2019) from treatment -If labs & symptoms stable at next visit will move to 6 month f/u -Recommend pt get Magnesium checked with next labs  -Will see back in  4 months with labs    FOLLOW UP: RTC with Dr [name] with labs in 4 months   The total time spent in the appt was 20 minutes and more than 50% was on counseling and direct patient cares.  All of the patient's questions were answered with apparent satisfaction. The patient knows to call the clinic with any problems, questions or concerns.    Sullivan Lone MD [place] AAHIVMS [facility] Hematology/Oncology Physician [facility]  (Office):       [phone] (Work cell):  [phone] (Fax):           [phone]   I, Yevette Edwards, am acting as a scribe for Dr. Sullivan Lone.   .I have reviewed the above documentation for accuracy and completeness, and I agree with the above. [name] MD

## 2020-01-10 ENCOUNTER — Telehealth: Payer: Self-pay | Admitting: Hematology

## 2020-01-10 NOTE — Telephone Encounter (Signed)
Scheduled per 04/28 los, spoke with patient's wife and he will be notified.

## 2020-01-12 LAB — CULTURE, BLOOD (ROUTINE X 2)
Culture: NO GROWTH
Culture: NO GROWTH
Special Requests: ADEQUATE
Special Requests: ADEQUATE

## 2020-01-30 NOTE — Progress Notes (Signed)
Phone [phone] In person visit   Subjective:   Mark Frey is a 64 y.o. year old very pleasant male patient who presents for/with See problem oriented charting Chief Complaint  Patient presents with  . Depression  . Diabetes  . Hyperlipidemia  . Hypertension   This visit occurred during the SARS-CoV-2 public health emergency.  Safety protocols were in place, including screening questions prior to the visit, additional usage of staff PPE, and extensive cleaning of exam room while observing appropriate contact time as indicated for disinfecting solutions.   Past Medical History-  Patient Active Problem List   Diagnosis Date Noted  . Colostomy status ([place]) 02/02/2020    Priority: High  . Status post colectomy 10/09/2019    Priority: High  . Bowel obstruction in setting of lymphoma and sigmoid stricture 07/21/2018    Priority: High  . 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 History of pulmonary embolism 03/10/2018    Priority: High  . Controlled diabetes mellitus type II without complication ([place]) 23/95/3202    Priority: High  . Depression, major, single episode, complete remission ([place]) 04/17/2019    Priority: Medium  . Hypomagnesemia 01/11/2019    Priority: Medium  . Edema     Priority: Medium  . Tachycardia 01/31/2017    Priority: Medium  . Hypertension associated with diabetes ([name]) 06/05/2014    Priority: Medium  . Hyperlipidemia associated with type 2 diabetes mellitus ([place]) 05/12/2007    Priority: Medium  . Radiation gastroenteritis     Priority: Low  . Radiation colitis     Priority: Low  . Loop ilesotomy for fecal diversion of sigmoid stricture 08/25/2018    Priority: Low  . 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 Counseling regarding advance care planning and goals of care 04/25/2018    Priority: Low  . Dysmetabolic syndrome X 33/43/5686    Priority: Low  .  BACK PAIN WITH RADICULOPATHY 06/09/2007    Priority: Low  . ALLERGIC RHINITIS 05/12/2007    Priority: Low  . Goals of care, counseling/discussion   . Port-A-Cath in place 03/27/2018    Medications- reviewed and updated Current Outpatient Medications  Medication Sig Dispense Refill  . acetaminophen (TYLENOL) 325 MG tablet Take 2 tablets (650 mg total) by mouth every 6 (six) hours as needed for mild pain (or temp > 100).    . B Complex Vitamins (VITAMIN B COMPLEX PO) Take 1 tablet by mouth daily.     . blood glucose meter kit and supplies Dispense based on patient and insurance preference. Use daily as directed. (E11.9). 1 each 0  . citalopram (CELEXA) 20 MG tablet Take 1 tablet (20 mg total) by mouth daily. 90 tablet 3  . glucose blood (FREESTYLE TEST STRIPS) test strip Use to check blood sugar daily 100 each 4  . loperamide (IMODIUM) 2 MG capsule Take 2 mg by mouth as needed for diarrhea or loose stools.    . Magnesium 100 MG TABS Take 150 mg by mouth in the morning, at noon, and at bedtime.     No current facility-administered medications for this visit.     Objective:  BP 128/64   Pulse 78   Temp 98.4 F (36.9 C) (Temporal)   Ht 5' 9"  (1.753 m)   Wt 198 lb 12.8 oz (90.2 kg)   SpO2 98%   BMI 29.36 kg/m  Gen: NAD, resting  comfortably CV: RRR no murmurs rubs or gallops Lungs: CTAB no crackles, wheeze, rhonchi Abdomen: soft/nontender/nondistended/normal bowel sounds. Ostomy site intact . Not able to visualize wound underneath but family/patient reports stable Ext: bilateral 1+ edema Skin: warm, dry     Assessment and Plan   % History of high-grade B-cell lymphoma/Burkitt lymphoma  -Discovered on CT March 10, 2018-on initial measures 18.7 x 18.5 x 17.8 cm -S/p IMRT with 36 Gy over 20 fractions, between 11/09/18 and 12/06/18 with Dr. Isidore Moos -Patient completed chemotherapy October 2019 -Patient later developed small bowel obstruction/ileus-sigmoid obstructive lesion with  question of scar tissue from resolved lymphoma versus postinflammatory scarring from diverticulitis/limited perforation-ultimately required diverting ileostomy 08/25/2018 with Dr. Greer Pickerel -Patient ultimately had severe pancolitis thought related to radiation toxicity with recurrent abscesses and sepsis -[facility]. Victorio Palm at [facility] ultimately performed colectomy July 30 2019-permanent colostomy in place after multiple episodes of diverticulitis/stricture episodes after radiation for burkitt lymphoma -Patient remains on chronic magnesium-we will check magnesium each set of blood work  -also on chronic immodium post surgery -January 09, 2020 with Dr. Jacquelynn Cree counts steady and no lab or clinical evidence of recurrence of Burkitt's lymphoma 18 months out from treatment-now on 4-64-monthschedule   #hypertension-has been off medication since weight loss related to Burkitt lymphoma and subsequent complications S: medication: None A/P: Excellent control-continue without medication  # Diabetes-has been off medication since weight loss related to Burkitt lymphoma and subsequent complications S: Medication: None CBGs-does not check Exercise and diet-peak weight 298 prior to Burkitt's lymphoma  A/P: Excellent control today-continue medication  # Depression S: Medication: celexa 268mDepression screen [facility] 02/01/2020 10/09/2019 07/24/2019  Decreased Interest 0 0 0  Down, Depressed, Hopeless 0 0 0  PHQ - 2 Score 0 0 0  Altered sleeping 0 0 0  Tired, decreased energy 0 0 0  Change in appetite 0 0 0  Feeling bad or failure about yourself  0 0 0  Trouble concentrating 0 0 0  Moving slowly or fidgety/restless 0 0 -  Suicidal thoughts 0 0 -  PHQ-9 Score 0 0 0  Difficult doing work/chores Not difficult at all Not difficult at all -  Some recent data might be hidden  A/P: Excellent control today-continue current medications  #Edema -patient with increased edema recently.  We will update blood work to  make sure no obvious cause of edema.  He was not able to urinate today to check urinalysis to look for proteinuria.  I did offer proBNP testing for possible heart failure but patient declines at this time-I was considering this with recent weight gain but he reports this is related to increased food intake.  I did instruct him to monitor his salt intake that could contribute to edema  #hyperlipidemia- refuses statin despite diabetes S: Medication: None Lab Results  Component Value Date   CHOL 140 02/01/2020   HDL 32.20 (L) 02/01/2020   LDLCALC 112 (H) 08/29/2017   LDLDIRECT 80.0 02/01/2020   TRIG 207.0 (H) 02/01/2020   CHOLHDL 4 02/01/2020   A/P: Poor control considering patient has diabetes-he firmly declines statin even once a week pulse therapy  Recommended follow up: 4-6 month physical- will have admin call to recommend Future Appointments  Date Time Provider [place]8/27/2021 11:00 AM [facility] LAB 2 [facility] None  05/09/2020 11:40 AM [facility]    Lab/Order associations:   ICD-10-CM   1. Controlled type 2 diabetes mellitus without complication, without long-term current use  of insulin ([place])  E11.9 Hemoglobin A1c  2. Hypertension associated with diabetes ([place])  E11.59    I10   3. Hyperlipidemia associated with type 2 diabetes mellitus ([facility])  E11.69 CBC with Differential/Platelet   E78.5 Comprehensive metabolic panel    Lipid panel  4. Depression, major, single episode, complete remission ([place])  F32.5   5. Colostomy status ([place])  Z93.3   6. Hypomagnesemia  E83.42 Magnesium  7. Bilateral leg edema  R60.0 TSH  8. Need for shingles vaccine  Z23 Varicella-zoster vaccine IM (Shingrix)   Return precautions advised.  Garret Reddish, MD

## 2020-01-30 NOTE — Patient Instructions (Addendum)
Health Maintenance Due  Topic Date Due  . PNEUMOCOCCAL POLYSACCHARIDE VACCINE AGE 64-64 HIGH RISK  Declined  Never done  . TETANUS/TDAP Declined , consider at future visit 09/17/2008  . OPHTHALMOLOGY EXAM  Will make appointment 03/23/2016  . URINE MICROALBUMIN next visit since recently urinated 01/31/2018    Final shingrix today  Would watch salt intake- checking labs for swelling- if worsening issues let us know- would need to check urine to make sure not spilling protein. Also if have shortness of breath let us know.   Please stop by lab before you go If you have mychart- we will send your results within 3 business days of [place] receiving them.  If you do not have mychart- we will call you about results within 5 business days of [place] receiving them.

## 2020-02-01 ENCOUNTER — Other Ambulatory Visit: Payer: Self-pay

## 2020-02-01 ENCOUNTER — Encounter: Payer: Self-pay | Admitting: Family Medicine

## 2020-02-01 ENCOUNTER — Ambulatory Visit (INDEPENDENT_AMBULATORY_CARE_PROVIDER_SITE_OTHER): Payer: 59 | Admitting: Family Medicine

## 2020-02-01 VITALS — BP 128/64 | HR 78 | Temp 98.4°F | Ht 69.0 in | Wt 198.8 lb

## 2020-02-01 DIAGNOSIS — E1169 Type 2 diabetes mellitus with other specified complication: Secondary | ICD-10-CM

## 2020-02-01 DIAGNOSIS — E1159 Type 2 diabetes mellitus with other circulatory complications: Secondary | ICD-10-CM

## 2020-02-01 DIAGNOSIS — F325 Major depressive disorder, single episode, in full remission: Secondary | ICD-10-CM | POA: Diagnosis not present

## 2020-02-01 DIAGNOSIS — E785 Hyperlipidemia, unspecified: Secondary | ICD-10-CM

## 2020-02-01 DIAGNOSIS — R600 Localized edema: Secondary | ICD-10-CM

## 2020-02-01 DIAGNOSIS — E119 Type 2 diabetes mellitus without complications: Secondary | ICD-10-CM

## 2020-02-01 DIAGNOSIS — Z933 Colostomy status: Secondary | ICD-10-CM

## 2020-02-01 DIAGNOSIS — Z23 Encounter for immunization: Secondary | ICD-10-CM

## 2020-02-01 DIAGNOSIS — I10 Essential (primary) hypertension: Secondary | ICD-10-CM

## 2020-02-01 LAB — COMPREHENSIVE METABOLIC PANEL
ALT: 29 U/L (ref 0–53)
AST: 27 U/L (ref 0–37)
Albumin: 4.3 g/dL (ref 3.5–5.2)
Alkaline Phosphatase: 78 U/L (ref 39–117)
BUN: 17 mg/dL (ref 6–23)
CO2: 28 mEq/L (ref 19–32)
Calcium: 9.7 mg/dL (ref 8.4–10.5)
Chloride: 104 mEq/L (ref 96–112)
Creatinine, Ser: 0.87 mg/dL (ref 0.40–1.50)
GFR: 88.47 mL/min (ref >60.00)
Glucose, Bld: 86 mg/dL (ref 70–99)
Potassium: 4.6 mEq/L (ref 3.5–5.1)
Sodium: 138 mEq/L (ref 135–145)
Total Bilirubin: 0.3 mg/dL (ref 0.2–1.2)
Total Protein: 7.3 g/dL (ref 6.0–8.3)

## 2020-02-01 LAB — CBC WITH DIFFERENTIAL/PLATELET
Basophils Absolute: 0 10*3/uL (ref 0.0–0.1)
Basophils Relative: 0.6 % (ref 0.0–3.0)
Eosinophils Absolute: 0.1 10*3/uL (ref 0.0–0.7)
Eosinophils Relative: 1.2 % (ref 0.0–5.0)
HCT: 34.8 % — ABNORMAL LOW (ref 39.0–52.0)
Hemoglobin: 11.6 g/dL — ABNORMAL LOW (ref 13.0–17.0)
Lymphocytes Relative: 35.1 % (ref 12.0–46.0)
Lymphs Abs: 1.9 10*3/uL (ref 0.7–4.0)
MCHC: 33.2 g/dL (ref 30.0–36.0)
MCV: 98.8 fl (ref 78.0–100.0)
Monocytes Absolute: 0.6 10*3/uL (ref 0.1–1.0)
Monocytes Relative: 10.9 % (ref 3.0–12.0)
Neutro Abs: 2.8 10*3/uL (ref 1.4–7.7)
Neutrophils Relative %: 52.2 % (ref 43.0–77.0)
Platelets: 218 10*3/uL (ref 150.0–400.0)
RBC: 3.52 Mil/uL — ABNORMAL LOW (ref 4.22–5.81)
RDW: 17.6 % — ABNORMAL HIGH (ref 11.5–15.5)
WBC: 5.4 10*3/uL (ref 4.0–10.5)

## 2020-02-01 LAB — LIPID PANEL
Cholesterol: 140 mg/dL (ref 0–200)
HDL: 32.2 mg/dL — ABNORMAL LOW (ref >39.00)
NonHDL: 107.59
Total CHOL/HDL Ratio: 4
Triglycerides: 207 mg/dL — ABNORMAL HIGH (ref 0.0–149.0)
VLDL: 41.4 mg/dL — ABNORMAL HIGH (ref 0.0–40.0)

## 2020-02-01 LAB — MAGNESIUM: Magnesium: 1.5 mg/dL (ref 1.5–2.5)

## 2020-02-01 LAB — TSH: TSH: 1.16 u[IU]/mL (ref 0.35–4.50)

## 2020-02-01 LAB — LDL CHOLESTEROL, DIRECT: Direct LDL: 80 mg/dL

## 2020-02-01 LAB — HEMOGLOBIN A1C: Hgb A1c MFr Bld: 5.1 % (ref 4.6–6.5)

## 2020-02-02 ENCOUNTER — Encounter: Payer: Self-pay | Admitting: Family Medicine

## 2020-02-02 DIAGNOSIS — Z933 Colostomy status: Secondary | ICD-10-CM | POA: Insufficient documentation

## 2020-02-04 NOTE — Progress Notes (Signed)
Called and spoke with pt caregiver. States she will go on MyChart and schedule appt when she has a chance to look at her work schedule.

## 2020-03-24 ENCOUNTER — Telehealth: Payer: Self-pay | Admitting: *Deleted

## 2020-03-24 NOTE — Telephone Encounter (Signed)
Wife called - patient last had port flushed at last appt 4/25. His next lab/flush appt is on 8/27 -- in 4 months. Does he need a flush appt before 8/27? Dr.Kale informed. Contacted Ms. Dufresne with Dr. Grier Mitts response - Mr. Schaller needs to have port flushed every 8-10 weeks. Ms. Gibeault verbalized understanding. Message sent to scheduling to contact patient for appt.

## 2020-04-07 ENCOUNTER — Other Ambulatory Visit: Payer: Self-pay

## 2020-04-07 ENCOUNTER — Inpatient Hospital Stay: Payer: 59 | Attending: Hematology

## 2020-04-07 DIAGNOSIS — Z452 Encounter for adjustment and management of vascular access device: Secondary | ICD-10-CM | POA: Insufficient documentation

## 2020-04-07 DIAGNOSIS — Z95828 Presence of other vascular implants and grafts: Secondary | ICD-10-CM

## 2020-04-07 DIAGNOSIS — Z7189 Other specified counseling: Secondary | ICD-10-CM

## 2020-04-07 DIAGNOSIS — Z8572 Personal history of non-Hodgkin lymphomas: Secondary | ICD-10-CM | POA: Insufficient documentation

## 2020-04-07 MED ORDER — SODIUM CHLORIDE 0.9% FLUSH
10.0000 mL | Freq: Once | INTRAVENOUS | Status: AC
  Administered 2020-04-07: 10 mL
  Filled 2020-04-07: qty 10

## 2020-04-07 MED ORDER — HEPARIN SOD (PORK) LOCK FLUSH 100 UNIT/ML IV SOLN
500.0000 [IU] | Freq: Once | INTRAVENOUS | Status: AC
  Administered 2020-04-07: 500 [IU]
  Filled 2020-04-07: qty 5

## 2020-04-07 NOTE — Patient Instructions (Signed)

## 2020-04-24 ENCOUNTER — Encounter: Payer: Self-pay | Admitting: Hematology

## 2020-04-28 ENCOUNTER — Inpatient Hospital Stay: Payer: 59 | Attending: Hematology

## 2020-04-28 ENCOUNTER — Other Ambulatory Visit: Payer: Self-pay

## 2020-04-28 ENCOUNTER — Inpatient Hospital Stay (HOSPITAL_BASED_OUTPATIENT_CLINIC_OR_DEPARTMENT_OTHER): Payer: 59 | Admitting: Hematology

## 2020-04-28 VITALS — BP 118/74 | HR 86 | Temp 98.1°F | Resp 18 | Ht 69.0 in | Wt 223.2 lb

## 2020-04-28 DIAGNOSIS — E119 Type 2 diabetes mellitus without complications: Secondary | ICD-10-CM | POA: Diagnosis not present

## 2020-04-28 DIAGNOSIS — Z808 Family history of malignant neoplasm of other organs or systems: Secondary | ICD-10-CM | POA: Diagnosis not present

## 2020-04-28 DIAGNOSIS — Z8572 Personal history of non-Hodgkin lymphomas: Secondary | ICD-10-CM | POA: Diagnosis not present

## 2020-04-28 DIAGNOSIS — Z801 Family history of malignant neoplasm of trachea, bronchus and lung: Secondary | ICD-10-CM | POA: Diagnosis not present

## 2020-04-28 DIAGNOSIS — K520 Gastroenteritis and colitis due to radiation: Secondary | ICD-10-CM

## 2020-04-28 DIAGNOSIS — C8378 Burkitt lymphoma, lymph nodes of multiple sites: Secondary | ICD-10-CM

## 2020-04-28 DIAGNOSIS — Z86718 Personal history of other venous thrombosis and embolism: Secondary | ICD-10-CM | POA: Diagnosis not present

## 2020-04-28 DIAGNOSIS — E785 Hyperlipidemia, unspecified: Secondary | ICD-10-CM | POA: Diagnosis not present

## 2020-04-28 DIAGNOSIS — Z95828 Presence of other vascular implants and grafts: Secondary | ICD-10-CM

## 2020-04-28 DIAGNOSIS — Z79899 Other long term (current) drug therapy: Secondary | ICD-10-CM | POA: Insufficient documentation

## 2020-04-28 DIAGNOSIS — K912 Postsurgical malabsorption, not elsewhere classified: Secondary | ICD-10-CM | POA: Diagnosis not present

## 2020-04-28 LAB — CBC WITH DIFFERENTIAL/PLATELET
Abs Immature Granulocytes: 0.02 10*3/uL (ref 0.00–0.07)
Basophils Absolute: 0 10*3/uL (ref 0.0–0.1)
Basophils Relative: 1 %
Eosinophils Absolute: 0.1 10*3/uL (ref 0.0–0.5)
Eosinophils Relative: 1 %
HCT: 40.2 % (ref 39.0–52.0)
Hemoglobin: 13 g/dL (ref 13.0–17.0)
Immature Granulocytes: 0 %
Lymphocytes Relative: 37 %
Lymphs Abs: 1.8 10*3/uL (ref 0.7–4.0)
MCH: 32.9 pg (ref 26.0–34.0)
MCHC: 32.3 g/dL (ref 30.0–36.0)
MCV: 101.8 fL — ABNORMAL HIGH (ref 80.0–100.0)
Monocytes Absolute: 0.6 10*3/uL (ref 0.1–1.0)
Monocytes Relative: 11 %
Neutro Abs: 2.5 10*3/uL (ref 1.7–7.7)
Neutrophils Relative %: 50 %
Platelets: 201 10*3/uL (ref 150–400)
RBC: 3.95 MIL/uL — ABNORMAL LOW (ref 4.22–5.81)
RDW: 14.5 % (ref 11.5–15.5)
WBC: 5 10*3/uL (ref 4.0–10.5)
nRBC: 0 % (ref 0.0–0.2)

## 2020-04-28 LAB — CMP (CANCER CENTER ONLY)
ALT: 64 U/L — ABNORMAL HIGH (ref 0–44)
AST: 35 U/L (ref 15–41)
Albumin: 3.9 g/dL (ref 3.5–5.0)
Alkaline Phosphatase: 94 U/L (ref 38–126)
Anion gap: 8 (ref 5–15)
BUN: 18 mg/dL (ref 8–23)
CO2: 26 mmol/L (ref 22–32)
Calcium: 10.3 mg/dL (ref 8.9–10.3)
Chloride: 106 mmol/L (ref 98–111)
Creatinine: 1.24 mg/dL (ref 0.61–1.24)
GFR, Est AFR Am: >60 mL/min (ref >60)
GFR, Estimated: >60 mL/min (ref >60)
Glucose, Bld: 108 mg/dL — ABNORMAL HIGH (ref 70–99)
Potassium: 4.3 mmol/L (ref 3.5–5.1)
Sodium: 140 mmol/L (ref 135–145)
Total Bilirubin: 0.3 mg/dL (ref 0.3–1.2)
Total Protein: 7.8 g/dL (ref 6.5–8.1)

## 2020-04-28 LAB — MAGNESIUM: Magnesium: 1.4 mg/dL — CL (ref 1.7–2.4)

## 2020-04-28 LAB — LACTATE DEHYDROGENASE: LDH: 164 U/L (ref 98–192)

## 2020-04-28 LAB — PHOSPHORUS: Phosphorus: 3.5 mg/dL (ref 2.5–4.6)

## 2020-04-28 NOTE — Progress Notes (Signed)
HEMATOLOGY/ONCOLOGY CLINIC NOTE  Date of Service: 04/28/20    Patient Care Team: [name], MD as PCP - General (Family Medicine)  CHIEF COMPLAINTS/PURPOSE OF CONSULTATION:  F/u for continued Mx of Burkitts lymphoma  HISTORY OF PRESENTING ILLNESS:   Mark Frey is a wonderful 64 y.o. male who has been referred to [place] by my colleague Dr. 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 He is accompanied today by his wife. The pt reports that he is doing well overall.   The pt started R-EPOCH every 3 weeks with neulasta support on 03/17/18. He presented to the ED with right leg swelling, found to be a DVT and bilateral PE, which resulted in the incidental finding of a lower abdomen tumor measuring 18.7 x 18.5 x 17.8 cm. He denies any abdominal pains or changes in his bowel habits prior to this finding. He first noted blood in the stools 4-5 days prior to appearing to the ED.  The pt reports that he has been taking 164m Lovenox injections once each day, except for the days in which he had blood in the stools. He hasn't had blood in his stools for the last 7 days.   He has had GI bleed related to bowel involvement with his lymphoma - this is now resolving and his hgb is stabilizing.  He notes that his scrotum and leg swelling has decreased. He denies having CP or SOB at any point from his PE.   Most recent lab results (04/06/18) of CBC w/diff, CMP, Reticulocytes  is as follows: all values are WNL except for WBC at 15.3k, RBC at 3.65, HGB at 10.5, HCT at 31.4, RDW at 17.8, PLT at 640k, ANC at 12.7k, Monocytes abs at 1.6k, Glucose at 162, Total Protein at 6.2, Albumin at 3.1, Total bilirubin at <0.2, Retic ct pct at 2.5%. Uric acid 04/06/18 was low at 3.0 LDH 04/06/18 elevated at 251  On review of systems, pt reports remaining right leg swelling, blood in the stools 7 days ago, two  bowel movements each day, left leg swelling, improved scrotal swelling, eating well, and denies problems with his port, abdominal pains, constipation, diarrhea, jaw pain, pain along the spine, problems passing urine, and any other symptoms.  Interval History:   Mark GOODELLwas seen in f/u of his Burkitt's lymphoma. We are joined today by [name] his wife. The patient[place] last [place]. The pt reports that he is doing well overall.  The pt reports that he has been feeling well and eating well. Pt has a fistula that is opening nearly once every two months. His Colostomy has been stable. He continues taking about 300 mg Magnesium per day. Pt has received his COVID19 vaccines and is interested in the booster.   Lab results today (04/28/20) of CBC w/diff and CMP is as follows: all values are WNL except for RBC at 3.95, MCV at 101.8, Glucose at 108, ALT at 64. 04/28/2020 LDH at 164 04/28/2020 Phosphorus at 3.5 04/28/2020 Magnesium at 1.4  On review of systems, pt denies fevers, chills, night sweats, leg swelling, unexpected weight loss, low appetite, abdominal pain, new lumps/bumps and any other symptoms.   MEDICAL HISTORY:  Past Medical History:  Diagnosis Date  . ALLERGIC RHINITIS   [place]   . Diabetes mellitus   . History of deep  venous thrombosis (DVT) of distal vein of right lower extremity    Also bilateral PE.  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 so was considered provoked  . Hyperlipidemia     SURGICAL HISTORY: Past Surgical History:  Procedure Laterality Date  . BIOPSY  03/15/2018   Procedure: BIOPSY;  Surgeon: Milus Banister, MD;  Location: [name];  Service: Endoscopy;;  . BIOPSY  03/01/2019   Procedure: BIOPSY;  Surgeon: Ladene Artist, MD;  Location: [facility];  Service: Endoscopy;;  . BOWEL RESECTION N/A 08/25/2018   Procedure: LAPROSCOPIC LOOP ILEOSTOMY;  Surgeon: Greer Pickerel, MD;   Location: [facility];  Service: General;  Laterality: N/A;  . CLEFT PALATE REPAIR    . COLECTOMY  07/30/2019  . COLONOSCOPY N/A 03/15/2018   Procedure: COLONOSCOPY;  Surgeon: Milus Banister, MD;  Location: [facility];  Service: Endoscopy;  Laterality: N/A;  . COLONOSCOPY N/A 08/20/2018   Procedure: COLONOSCOPY;  Surgeon: Irene Shipper, MD;  Location: [facility];  Service: Endoscopy;  Laterality: N/A;  . deviated septum repair     slight improvement  . ESOPHAGOGASTRODUODENOSCOPY N/A 03/15/2018   Procedure: ESOPHAGOGASTRODUODENOSCOPY (EGD);  Surgeon: Milus Banister, MD;  Location: [name];  Service: Endoscopy;  Laterality: N/A;  . FLEXIBLE SIGMOIDOSCOPY N/A 03/01/2019   Procedure: FLEXIBLE SIGMOIDOSCOPY;  Surgeon: Ladene Artist, MD;  Location: [facility];  Service: Endoscopy;  Laterality: N/A;  . ILEOSCOPY N/A 03/01/2019   Procedure: ILEOSCOPY THROUGH STOMA;  Surgeon: Ladene Artist, MD;  Location: [facility];  Service: Endoscopy;  Laterality: N/A;  . IR IMAGING GUIDED PORT INSERTION  03/17/2018  . LAPAROSCOPY N/A 08/25/2018   Procedure: LAPAROSCOPY DIAGNOSTIC;  Surgeon: Greer Pickerel, MD;  Location: [facility];  Service: General;  Laterality: N/A;  . TONSILLECTOMY      SOCIAL HISTORY: Social History   Socioeconomic History  . Marital status: Married    Spouse name: lisa  . Number of children: 0  . Years of education: Not on file  . Highest education level: Not on file  Occupational History  . Not on file  Tobacco Use  . Smoking status: Never Smoker  . Smokeless tobacco: Never Used  Vaping Use  . Vaping Use: Never used  Substance and Sexual Activity  . Alcohol use: Yes    Comment: occasional  . Drug use: No  . Sexual activity: Yes  Other Topics Concern  . Not on file  Social History Narrative   Married 1985. No kids. 4 small dogs.       Works in Financial trader, residential      Hobbies: work on cars, Haematologist, exercise as able   Social Determinants of  Radio broadcast assistant Strain:   . Difficulty of Paying Living Expenses:   Food Insecurity:   . Worried About Charity fundraiser in the Last Year:   . Arboriculturist in the Last Year:   Transportation Needs:   . Film/video editor (Medical):   [name] Lack of Transportation (Non-Medical):   Physical Activity:   . Days of Exercise per Week:   . Minutes of Exercise per Session:   Stress:   . Feeling of Stress :   Social Connections:   . Frequency of Communication with Friends and Family:   . Frequency of Social Gatherings with Friends and Family:   . Attends Religious Services:   . Active Member of Clubs or Organizations:   . Attends  Club or Organization Meetings:   [name] Marital Status:   Intimate Partner Violence:   . Fear of Current or Ex-Partner:   . Emotionally Abused:   [name] Physically Abused:   . Sexually Abused:     FAMILY HISTORY: Family History  Problem Relation Age of Onset  . Lung cancer Mother        smoker  . Brain cancer Mother        metastasis  . AAA (abdominal aortic aneurysm) Father        smoker    ALLERGIES:  is allergic to ciprofloxacin.  MEDICATIONS:  Current Outpatient Medications  Medication Sig Dispense Refill  . acetaminophen (TYLENOL) 325 MG tablet Take 2 tablets (650 mg total) by mouth every 6 (six) hours as needed for mild pain (or temp > 100).    . B Complex Vitamins (VITAMIN B COMPLEX PO) Take 1 tablet by mouth daily.     . blood glucose meter kit and supplies Dispense based on patient and insurance preference. Use daily as directed. (E11.9). 1 each 0  . citalopram (CELEXA) 20 MG tablet Take 1 tablet (20 mg total) by mouth daily. 90 tablet 3  . glucose blood (FREESTYLE TEST STRIPS) test strip Use to check blood sugar daily 100 each 4  . loperamide (IMODIUM) 2 MG capsule Take 2 mg by mouth as needed for diarrhea or loose stools.    . Magnesium 100 MG TABS Take 150 mg by mouth in the morning, at noon, and at bedtime.     No current  facility-administered medications for this visit.    REVIEW OF SYSTEMS:   A 10+ POINT REVIEW OF SYSTEMS WAS OBTAINED including neurology, dermatology, psychiatry, cardiac, respiratory, lymph, extremities, GI, GU, Musculoskeletal, constitutional, breasts, reproductive, HEENT.  All pertinent positives are noted in the HPI.  All others are negative.   PHYSICAL EXAMINATION: ECOG FS:2 - Symptomatic, <50% confined to bed  Vitals:   04/28/20 1428  BP: 118/74  Pulse: 86  Resp: 18  Temp: 98.1 F (36.7 C)  SpO2: 100%   Wt Readings from Last 3 Encounters:  04/28/20 223 lb 3.2 oz (101.2 kg)  02/01/20 198 lb 12.8 oz (90.2 kg)  01/06/20 195 lb (88.5 kg)   Body mass index is 32.96 kg/m.    GENERAL:alert, in no acute distress and comfortable SKIN: no acute rashes, no significant lesions EYES: conjunctiva are pink and non-injected, sclera anicteric OROPHARYNX: MMM, no exudates, no oropharyngeal erythema or ulceration NECK: supple, no JVD LYMPH:  no palpable lymphadenopathy in the cervical, axillary or inguinal regions LUNGS: clear to auscultation b/l with normal respiratory effort HEART: regular rate & rhythm ABDOMEN:  normoactive bowel sounds , non tender, not distended. No palpable hepatosplenomegaly.  Extremity: minima pedal edema PSYCH: alert & oriented x 3 with fluent speech NEURO: no focal motor/sensory deficits  LABORATORY DATA:   CBC Latest Ref Rng & Units 04/28/2020 02/01/2020 01/06/2020  WBC 4.0 - 10.5 K/uL 5.0 5.4 6.6  Hemoglobin 13.0 - 17.0 g/dL 13.0 11.6(L) 10.4(L)  Hematocrit 39 - 52 % 40.2 34.8(L) 33.0(L)  Platelets 150 - 400 K/uL 201 218.0 205     CMP Latest Ref Rng & Units 04/28/2020 02/01/2020 01/06/2020  Glucose 70 - 99 mg/dL 108(H) 86 112(H)  BUN 8 - 23 mg/dL _0 Creatinine 0.61 - 1.24 mg/dL 1.24 0.87 0.64  Sodium 135 - 145 mmol/L 140 138 140  Potassium 3.5 - 5.1 mmol/L 4.3 4.6 4.1  Chloride 98 - 111  mmol/L 106 104 106  CO2 22 - 32 mmol/L _0 Calcium  8.9 - 10.3 mg/dL 10.3 9.7 9.3  Total Protein 6.5 - 8.1 g/dL 7.8 7.3 7.6  Total Bilirubin 0.3 - 1.2 mg/dL 0.3 0.3 0.5  Alkaline Phos 38 - 126 U/L 94 78 69  AST 15 - 41 U/L 35 27 20  ALT 0 - 44 U/L 64(H) 29 23   Lab Results  Component Value Date   LDH 164 04/28/2020    03/01/19 Colonoscopy biopsies:     03/17/18 Cytogenetics:   03/17/18 Colon Bx:   03/13/18 Peritoneum Biopsy:     07/02/2019 PORTABLE CHEST 1 VIEW (Accession [id number])    07/02/2019 CT ABDOMEN AND PELVIS WITH CONTRAST (Accession [id number])  RADIOGRAPHIC STUDIES:  I have personally reviewed the radiological images as listed and agreed with the findings in the report. No results found.  ASSESSMENT & PLAN:   64 y.o. male with  1. 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 03/10/18 CT A/P revealed Large irregular infiltrative solid mass in the right lower quadrant measuring up to 18.7 x 18.5 x 17.8 cm, infiltrating and encasing multiple distal small bowel loops and likely the ileocecal region, partially encasing the sigmoid colon, with prominent extension into the right lower retroperitoneum and extraperitoneal right pelvis with encasement of right external iliac and proximal right common iliac vasculature and infiltration of the right iliopsoas muscle. No BM or CNS involvement.   04/18/18 PET/CT revealed Continued improvement in right colonic wall thickening and surrounding bulky masses consistent with treated lymphoma. No hypermetabolic activity to suggest residual tumor. There is some hypermetabolic activity associated with the sigmoid colon which demonstrates mild wall thickening, surrounding inflammation and underlying diverticulosis, suggesting mild diverticulitis. Suspected treatment related changes throughout the bone marrow. There is mildly increased activity within the clivus without clear corresponding finding on the CT images.  Attention on follow-up recommended. Nonobstructing right renal calculi. 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   06/09/18 PET/CT revealedSoft tissue lesion within the ventral pelvis is again identified demonstrating mild to moderate increased uptake within SUV max of 5.61. Deauville criteria 4. Concerning for residual metabolically active tumor. 2. Similar appearance of diffusely thickened appendix within the right lower quadrant of the abdomen. Findings may reflect treatment related changes. 3. Increased radiotracer uptake throughout the bone marrow which is favored to represent treatment related changes. 4. Lad coronary artery atherosclerotic calcifications.   patient completed planned chemorx on 10/21  10/05/18 CT A/P revealed Persistent wall thickening in the sigmoid colon although a component of this could be due to diverticulosis. There is also a suggestion of wall thickening and potential mucosal enhancement in the terminal ileum, as well as adjacent indistinct stranding in the mesenteric and omental adipose tissues roughly similar to the prior exam. Some of this may be treatment related. Inflammatory process such as Crohn's disease not excluded. 2. No current adenopathy. 3. Other imaging findings of potential clinical significance: Nonobstructive right nephrolithiasis. Aortic Atherosclerosis. Moderate prostatomegaly. Left foraminal impingement at L4-5.  S/p IMRT with 36 Gy over 20 fractions, between 11/09/18 and 12/06/18 with Dr. Isidore Moos  05/24/2019 CT CAP revealed "1. Overall little interval change from CT 05/09/2019. Persistent inflamed loop of distal sigmoid colon with submucosal edema. Potential fistulous communication versus tethering to the ascending colon and a loop of small bowel. Findings are suggestive of inflammatory bowel disease. 2. No evidence of bowel obstruction. Oral contrast enters the ileostomy  bag with bowel obstruction or small bowel dilatation."  2.H/oRLE DVT and b/l PEon  anticoagulation.   3. Small bowel obstruction/ileus with sigmoid thickening/stenosis .  CT scan - sigmoid obstructive lesion ? Scar tissue from resolved lymphoma vs residual lymphoma vs post inflammatory scarring from diverticulitis/limited perforation. Also has SBO from adhesions. Colonoscopy 08/21/2018 - no intramucosal lesions. Extrinsic compression of sigmoid colon. S/p diverting ileostomy on 08/25/18 with Dr. Greer Pickerel  4.  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  GI panel and C. difficile negative.  CMV IgM negative, EBV IgM negative. 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 Recurrent abscesses and sepsis. Now s/p colectomy  --surgical pathology with no residual lymphoma.  5. Short gut syndrome with high stool output.  6. H/o hypomagnesemia - on replacement.  6. S/p  Pancytopenia   PLAN: -Discussed pt labwork today, 04/28/20; WBC, Hgb, & PLT are nml, ALT is borderline elevated, other blood chemistries are nml, LDH is WNL, Magnesium is borderline low, Phosphorus is WNL -The pt shows no lab or clinical evidence of recurrence of his Burkitt's Lymphoma at this time. -Pt is nearly two years out from treatment (October 2019). -Recommend dried fruits, such as cashews or walnuts- for mjagnesium -Advised pt that we would recommend 1000 mg Magnesium daily, but that it has to be balanced with his tendency to loose, watery stools. -Recommended that the pt continue to eat well, drink at least 48-64 oz of water each day, and walk 20-30 minutes each day.  -Will see back in 3 months with labs and portflush   FOLLOW UP: RTC with Dr [name] with portflush and labs in 3 months   The total time spent in the appt was 20 minutes and more than 50% was on counseling and direct patient cares.  All of the patient's questions were answered with apparent satisfaction. The patient knows to call the clinic with any problems,  questions or concerns.    Sullivan Lone MD [place] AAHIVMS [facility] Hematology/Oncology Physician [facility]  (Office):       [phone] (Work cell):  [phone] (Fax):           [phone]   I, Yevette Edwards, am acting as a scribe for Dr. Sullivan Lone.   .I have reviewed the above documentation for accuracy and completeness, and I agree with the above. [name] MD

## 2020-05-09 ENCOUNTER — Inpatient Hospital Stay (HOSPITAL_BASED_OUTPATIENT_CLINIC_OR_DEPARTMENT_OTHER): Payer: 59 | Admitting: Medical

## 2020-05-09 ENCOUNTER — Other Ambulatory Visit: Payer: 59

## 2020-05-09 ENCOUNTER — Telehealth: Payer: Self-pay | Admitting: Hematology

## 2020-05-09 ENCOUNTER — Encounter: Payer: Self-pay | Admitting: Medical

## 2020-05-09 ENCOUNTER — Telehealth: Payer: Self-pay | Admitting: *Deleted

## 2020-05-09 ENCOUNTER — Other Ambulatory Visit: Payer: Self-pay

## 2020-05-09 ENCOUNTER — Ambulatory Visit: Payer: 59 | Admitting: Hematology

## 2020-05-09 VITALS — BP 128/83 | HR 90 | Temp 99.0°F | Resp 17 | Ht 69.0 in | Wt 224.5 lb

## 2020-05-09 DIAGNOSIS — N63 Unspecified lump in unspecified breast: Secondary | ICD-10-CM

## 2020-05-09 DIAGNOSIS — Z8572 Personal history of non-Hodgkin lymphomas: Secondary | ICD-10-CM

## 2020-05-09 NOTE — Patient Instructions (Signed)

## 2020-05-09 NOTE — Telephone Encounter (Signed)
Pt called stating there was a hardened area underneath right nipple that is very sore. Pt will be seen in [facility] today at 1pm. Scheduling message sent. Pt wife is aware

## 2020-05-09 NOTE — Telephone Encounter (Signed)
Scheduled per 08/16 los, spoke with patient's wife and patient will be notified.

## 2020-05-12 ENCOUNTER — Other Ambulatory Visit: Payer: Self-pay | Admitting: Medical

## 2020-05-12 DIAGNOSIS — N63 Unspecified lump in unspecified breast: Secondary | ICD-10-CM

## 2020-05-12 NOTE — Progress Notes (Signed)
[facility] Progress Note   [name] [id number] 01-01-1956 64 y.o.  Mark Frey is managed by Dr. 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 Mark Frey will see [name] next on 07/28/2020.  Please see After Visit Summary for patient specific instructions.  Future Appointments  Date Time Provider Keener  05/26/2020  9:10 AM [facility] DIAG TOMO 2 [facility]-BCGMM GI-BREAST CE  05/26/2020  9:20 AM [facility] [place] 2 GI-BCGUS GI-BREAST CE  06/23/2020  2:40 PM [name], MD [facility] PEC  07/28/2020  9:30 AM [facility] None  07/28/2020  9:45 AM [facility] [place] FLUSH [facility] None  07/28/2020 10:20 AM [name], MD [facility] None    Orders Placed This Encounter  Procedures  . MM DIAG BREAST TOMO BILATERAL       Subjective:   Patient ID:  Mark Frey is a 64 y.o. (DOB Mar 24, 1956) male.  Chief Complaint:  Chief Complaint  Patient presents with  . Mass    R breast    HPI Mark Frey  is a 64 y.o. 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 Mark Frey is followed by [name] and has completed therapy and continues to be followed with conservative follow up. Mark Frey was last seen on 04/28/2020 and is scheduled to be seen next on 07/28/2020. Mark Frey presents today with a report of a hardened area deep to his  right areola. Mark Frey first noticed this 2 weeks ago. Mark Frey has had no injury or changes in activity. The area is not tender, bruised or erythematous.  Medications: I have reviewed the patient's current medications.  Allergies:  Allergies  Allergen Reactions  . Ciprofloxacin Other (See Comments)    Leg tingling    Past Medical History:  Diagnosis Date  . ALLERGIC RHINITIS   . Cancer ([place])    Lymphoma   . Diabetes mellitus   . History of deep venous thrombosis (DVT) of distal vein of right lower extremity    Also bilateral PE.  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 so was considered provoked  . Hyperlipidemia     Past Surgical History:  Procedure Laterality Date  . BIOPSY  03/15/2018   Procedure: BIOPSY;  Surgeon: Milus Banister, MD;  Location: [name];  Service: Endoscopy;;  . BIOPSY  03/01/2019   Procedure: BIOPSY;  Surgeon: Ladene Artist, MD;  Location: [facility];  Service: Endoscopy;;  . BOWEL RESECTION N/A 08/25/2018   Procedure: LAPROSCOPIC LOOP ILEOSTOMY;  Surgeon: Greer Pickerel, MD;  Location: [facility];  Service: General;  Laterality: N/A;  . CLEFT PALATE REPAIR    . COLECTOMY  07/30/2019  . COLONOSCOPY N/A 03/15/2018   Procedure: COLONOSCOPY;  Surgeon: Milus Banister, MD;  [facility];  Service: Endoscopy;  Laterality: N/A;  . COLONOSCOPY N/A 08/20/2018   Procedure: COLONOSCOPY;  Surgeon: Irene Shipper, MD;  Location: [name];  Service: Endoscopy;  Laterality: N/A;  . deviated septum repair     slight improvement  . ESOPHAGOGASTRODUODENOSCOPY N/A 03/15/2018   Procedure: ESOPHAGOGASTRODUODENOSCOPY (EGD);  Surgeon: Milus Banister, MD;  Location: [name];  Service: Endoscopy;  Laterality: N/A;  . FLEXIBLE SIGMOIDOSCOPY N/A 03/01/2019   Procedure: FLEXIBLE SIGMOIDOSCOPY;  Surgeon: Ladene Artist, MD;  [facility];  Service: Endoscopy;  Laterality: N/A;  . ILEOSCOPY N/A 03/01/2019   Procedure: ILEOSCOPY  THROUGH STOMA;  Surgeon: Ladene Artist, MD;  [facility];  Service: Endoscopy;  Laterality: N/A;  . IR IMAGING GUIDED PORT INSERTION  03/17/2018  . LAPAROSCOPY N/A 08/25/2018   Procedure: LAPAROSCOPY DIAGNOSTIC;  Surgeon: Greer Pickerel, MD;  [facility];  Service: General;  Laterality: N/A;  . TONSILLECTOMY      Family History  Problem Relation Age of Onset  . Lung cancer Mother        smoker  . Brain cancer Mother        metastasis  . AAA (abdominal aortic aneurysm) Father        smoker    Social History   Socioeconomic History  . Marital status: Married    Spouse name: Mark Frey  . Number of children: 0  . Years of education: Not on file  . Highest education level: Not on file  Occupational History  . Not on file  Tobacco Use  . Smoking status: Never Smoker  . Smokeless tobacco: Never Used  Vaping Use  . Vaping Use: Never used  Substance and Sexual Activity  . Alcohol use: Yes    Comment: occasional  . Drug use: No  . Sexual activity: Yes  Other Topics Concern  . Not on file  Social History Narrative   Married 1985. No kids. 4 small dogs.       Works in Financial trader, residential      Hobbies: work on cars, Haematologist, exercise as able   Social Determinants of Radio broadcast assistant Strain:   . Difficulty of Paying Living Expenses: Not on file  Food Insecurity:   . Worried About Charity fundraiser in the Last Year: Not on file  . Ran Out of Food in the Last Year: Not on file  Transportation Needs:   . Lack of Transportation (Medical): Not on file  . Lack of Transportation (Non-Medical): Not on file  Physical Activity:   . Days of Exercise per Week: Not on file  . Minutes of Exercise per Session: Not on file  Stress:   . Feeling of Stress : Not on file  Social Connections:   . Frequency of Communication with Friends and Family: Not on file  . Frequency of Social Gatherings with Friends and Family: Not on file  . Attends  Religious Services: Not on file  . Active Member of Clubs or Organizations: Not on file  . Attends Archivist Meetings: Not on file  . Marital Status: Not on file  Intimate Partner Violence:   . Fear of Current or Ex-Partner: Not on file  . Emotionally Abused: Not on file  . Physically Abused: Not on file  . Sexually Abused: Not on file    Past Medical History, Surgical history, Social history, and Family history were reviewed and updated as appropriate.   Please see review of systems for further details on the patient's review from today.   Review of Systems:  Review of Systems  Constitutional: Negative for activity change and  fever.  Skin: Negative for color change, rash and wound.       Hardened, nontender area deep to his right areola  Hematological: Negative for adenopathy.    Objective:   Physical Exam:  BP 128/83 (BP Location: Right Arm, Patient Position: Sitting)   Pulse 90   Temp 99 F (37.2 C) (Tympanic)   Resp 17   Ht 5' 9"  (1.753 m)   Wt 101.8 kg (224 lb 8 oz)   SpO2 98% Comment: RA  BMI 33.15 kg/m  ECOG: 0  Physical Exam Constitutional:      General: Mark Frey is not in acute distress.    Appearance: Normal appearance. Mark Frey is not ill-appearing, toxic-appearing or diaphoretic.  HENT:     Head: Normocephalic and atraumatic.  Lymphadenopathy:     Cervical: No cervical adenopathy.  Skin:    Coloration: Skin is not jaundiced.     Findings: No bruising, erythema or rash.     Comments: Hardened, non-tender uniform mass deep to the right areola  Neurological:     Mental Status: Mark Frey is alert.     Coordination: Coordination normal.     Gait: Gait normal.  Psychiatric:        Mood and Affect: Mood normal.        Behavior: Behavior normal.        Thought Content: Thought content normal.        Judgment: Judgment normal.     Lab Review:     Component Value Date/Time   NA 140 04/28/2020 1347   K 4.3 04/28/2020 1347   CL 106 04/28/2020 1347   CO2 26  04/28/2020 1347   GLUCOSE 108 (H) 04/28/2020 1347   BUN 18 04/28/2020 1347   CREATININE 1.24 04/28/2020 1347   CREATININE 1.26 12/01/2012 1700   CALCIUM 10.3 04/28/2020 1347   PROT 7.8 04/28/2020 1347   ALBUMIN 3.9 04/28/2020 1347   AST 35 04/28/2020 1347   ALT 64 (H) 04/28/2020 1347   ALKPHOS 94 04/28/2020 1347   BILITOT 0.3 04/28/2020 1347   GFRNONAA >60 04/28/2020 1347   GFRAA >60 04/28/2020 1347       Component Value Date/Time   WBC 5.0 04/28/2020 1347   RBC 3.95 (L) 04/28/2020 1347   HGB 13.0 04/28/2020 1347   HGB 10.2 (L) 10/19/2019 1315   HCT 40.2 04/28/2020 1347   HCT 24.0 (L) 02/08/2019 1635   PLT 201 04/28/2020 1347   PLT 185 10/19/2019 1315   MCV 101.8 (H) 04/28/2020 1347   MCH 32.9 04/28/2020 1347   MCHC 32.3 04/28/2020 1347   RDW 14.5 04/28/2020 1347   LYMPHSABS 1.8 04/28/2020 1347   MONOABS 0.6 04/28/2020 1347   EOSABS 0.1 04/28/2020 1347   BASOSABS 0.0 04/28/2020 1347   -------------------------------  Imaging from last 24 hours (if applicable):  Radiology interpretation: No results found.      This case was discussed with [name]. Mark Frey expresses agreement with my management of this patient.

## 2020-05-13 ENCOUNTER — Other Ambulatory Visit: Payer: Self-pay

## 2020-05-13 ENCOUNTER — Ambulatory Visit
Admission: RE | Admit: 2020-05-13 | Discharge: 2020-05-13 | Disposition: A | Discharge status: Home / Self Care | Payer: 59 | Source: Ambulatory Visit | Attending: Medical | Admitting: Medical

## 2020-05-13 ENCOUNTER — Ambulatory Visit: Payer: 59

## 2020-05-13 DIAGNOSIS — N63 Unspecified lump in unspecified breast: Secondary | ICD-10-CM

## 2020-05-16 ENCOUNTER — Telehealth: Payer: Self-pay | Admitting: *Deleted

## 2020-05-16 NOTE — Telephone Encounter (Signed)
-----   Message from [name], MD sent at 05/14/2020 10:53 PM EDT ----- Plz let Marlou Sa know his MMG shows gynecomastia as clinically suspected, no evidence of malignancy.

## 2020-05-16 NOTE — Telephone Encounter (Signed)
Contacted patient regarding test results per Dr. Grier Mitts directions - spouse took call and verbalized understanding

## 2020-05-26 ENCOUNTER — Other Ambulatory Visit: Payer: 59

## 2020-06-04 ENCOUNTER — Encounter: Payer: Self-pay | Admitting: Physician Assistant

## 2020-06-04 ENCOUNTER — Ambulatory Visit (INDEPENDENT_AMBULATORY_CARE_PROVIDER_SITE_OTHER): Payer: 59 | Admitting: Physician Assistant

## 2020-06-04 ENCOUNTER — Other Ambulatory Visit: Payer: Self-pay

## 2020-06-04 VITALS — BP 120/80 | HR 75 | Temp 98.1°F | Ht 69.0 in | Wt 234.5 lb

## 2020-06-04 DIAGNOSIS — R238 Other skin changes: Secondary | ICD-10-CM

## 2020-06-04 NOTE — Progress Notes (Signed)
Mark Frey is a 64 y.o. male here for a new problem.  I acted as a Education administrator for [employer], PA-C Anselmo Pickler, LPN   History of Present Illness:   Chief Complaint  Patient presents with  . Blister    HPI   Blister Pt c/o two blisters on right ankle are since Friday evening after dinner. Had salt & pepper catfish, fried okra and coleslaw. There is one large blister and one small blister.  The small blister has significantly decreased in size. The area is not painful, weeping, itchy. He has not tried anything for this.    Past Medical History:  Diagnosis Date  . ALLERGIC RHINITIS   . Cancer ([place])    Lymphoma   . Diabetes mellitus   . History of deep venous thrombosis (DVT) of distal vein of right lower extremity    Also bilateral PE.  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 so was considered provoked  . Hyperlipidemia      Social History   Tobacco Use  . Smoking status: Never Smoker  . Smokeless tobacco: Never Used  Vaping Use  . Vaping Use: Never used  Substance Use Topics  . Alcohol use: Yes    Comment: occasional  . Drug use: No    Past Surgical History:  Procedure Laterality Date  . BIOPSY  03/15/2018   Procedure: BIOPSY;  Surgeon: Milus Banister, MD;  Location: [name];  Service: Endoscopy;;  . BIOPSY  03/01/2019   Procedure: BIOPSY;  Surgeon: Ladene Artist, MD;  [facility];  Service: Endoscopy;;  . BOWEL RESECTION N/A 08/25/2018   Procedure: LAPROSCOPIC LOOP ILEOSTOMY;  Surgeon: Greer Pickerel, MD;  Location: [facility];  Service: General;  Laterality: N/A;  . CLEFT PALATE REPAIR    . COLECTOMY  07/30/2019  . COLONOSCOPY N/A 03/15/2018   Procedure: COLONOSCOPY;  Surgeon: Milus Banister, MD;  Location: [facility];  Service: Endoscopy;  Laterality: N/A;  . COLONOSCOPY N/A 08/20/2018   Procedure: COLONOSCOPY;  Surgeon: Irene Shipper, MD;  Location: [facility];  Service: Endoscopy;  Laterality: N/A;   . deviated septum repair     slight improvement  . ESOPHAGOGASTRODUODENOSCOPY N/A 03/15/2018   Procedure: ESOPHAGOGASTRODUODENOSCOPY (EGD);  Surgeon: Milus Banister, MD;  Location: [name];  Service: Endoscopy;  Laterality: N/A;  . FLEXIBLE SIGMOIDOSCOPY N/A 03/01/2019   Procedure: FLEXIBLE SIGMOIDOSCOPY;  Surgeon: Ladene Artist, MD;  [facility];  Service: Endoscopy;  Laterality: N/A;  . ILEOSCOPY N/A 03/01/2019   Procedure: ILEOSCOPY THROUGH STOMA;  Surgeon: Ladene Artist, MD;  Location: [facility];  Service: Endoscopy;  Laterality: N/A;  . IR IMAGING GUIDED PORT INSERTION  03/17/2018  . LAPAROSCOPY N/A 08/25/2018   Procedure: LAPAROSCOPY DIAGNOSTIC;  Surgeon: Greer Pickerel, MD;  Location: [facility];  Service: General;  Laterality: N/A;  . TONSILLECTOMY      Family History  Problem Relation Age of Onset  . Lung cancer Mother        smoker  . Brain cancer Mother        metastasis  . AAA (abdominal aortic aneurysm) Father        smoker    Allergies  Allergen Reactions  . Ciprofloxacin Other (See Comments)    Leg tingling    Current Medications:   Current Outpatient Medications:  .  acetaminophen (TYLENOL) 325 MG tablet, Take 2 tablets (650 mg total) by mouth every 6 (six) hours as needed  for mild pain (or temp > 100)., Disp:  , Rfl:  .  B Complex Vitamins (VITAMIN B COMPLEX PO), Take 1 tablet by mouth daily. , Disp: , Rfl:  .  blood glucose meter kit and supplies, Dispense based on patient and insurance preference. Use daily as directed. (E11.9)., Disp: 1 each, Rfl: 0 .  glucose blood (FREESTYLE TEST STRIPS) test strip, Use to check blood sugar daily, Disp: 100 each, Rfl: 4 .  loperamide (IMODIUM) 2 MG capsule, Take 2 mg by mouth as needed for diarrhea or loose stools., Disp: , Rfl:  .  Magnesium 100 MG TABS, Take 150 mg by mouth in the morning, at noon, and at bedtime., Disp: , Rfl:  .  citalopram (CELEXA) 20 MG tablet, Take 1 tablet (20 mg total) by mouth  daily., Disp: 90 tablet, Rfl: 3   Review of Systems:   ROS  Negative unless otherwise specified per HPI.  Vitals:   Vitals:   06/04/20 1004  BP: 120/80  Pulse: 75  Temp: 98.1 F (36.7 C)  TempSrc: Temporal  SpO2: 97%  Weight: 234 lb 8 oz (106.4 kg)  Height: 5' 9"  (1.753 m)     Body mass index is 34.63 kg/m.  Physical Exam:   Physical Exam Vitals and nursing note reviewed.  Constitutional:      General: He is not in acute distress.    Appearance: He is well-developed. He is not ill-appearing or toxic-appearing.  Cardiovascular:     Rate and Rhythm: Normal rate and regular rhythm.     Pulses: Normal pulses.     Heart sounds: Normal heart sounds, S1 normal and S2 normal.  Pulmonary:     Effort: Pulmonary effort is normal.     Breath sounds: Normal breath sounds.  Musculoskeletal:     Right lower leg: 2+ Edema present.     Left lower leg: 2+ Edema present.  Skin:    General: Skin is warm and dry.     Comments: Large fluid-filled blister approximately the size of a quarter on the inner R lower leg; small flat lesion distal to large lesion. No tenderness to palpation.  Neurological:     Mental Status: He is alert.     GCS: GCS eye subscore is 4. GCS verbal subscore is 5. GCS motor subscore is 6.  Psychiatric:        Speech: Speech normal.        Behavior: Behavior normal. Behavior is cooperative.     Assessment and Plan:   Ram was seen today for blister.  Diagnoses and all orders for this visit:  Skin bulla   Suspect bulla 2/2 LE edema. No red flags on exam. Area was wrapped with ACE bandage provided in our office today. Telfa bandage placed between ACE bandage and skin. Worsening precautions advised and handout provided. Discouraged popping blister to prevent secondary infection.  CMA or LPN served as scribe during this visit. History, Physical, and Plan performed by medical provider. The above documentation has been reviewed and is accurate and  complete.   Inda Coke, PA-C

## 2020-06-04 NOTE — Patient Instructions (Signed)
As Dr. Jerline Pain said, this looks like a Bulla, which is a fancy term for a blister.  It is likely related to your swelling in your legs.  Keep legs elevated and add some compression (ACE bandage) to this area.  Please follow-up with Dr. Yong Channel on better management of your swelling to help prevent further flares.    Blisters, Adult  A blister is a raised bubble of skin filled with liquid. Blisters often develop in an area of the skin that repeatedly rubs or presses against another surface (friction blister). Friction blisters can occur on any part of the body, but they usually develop on the hands or feet. Long-term pressure on the same area of the skin can also lead to areas of hardened skin (calluses). What are the causes? A blister can be caused by:  An injury.  A burn.  An allergic reaction.  An infection.  Exposure to irritating chemicals.  Friction, especially in an area with a lot of heat and moisture. Friction blisters often result from:  Sports.  Repetitive activities.  Using tools and doing other activities without wearing gloves.  Shoes that are too tight or too loose. What are the signs or symptoms? A blister is often round and looks like a bump. It may:  Itch.  Be painful to the touch. Before a blister forms, the skin may:  Become red.  Feel warm.  Itch.  Be painful to the touch. How is this diagnosed? A blister is diagnosed with a physical exam. How is this treated? Treatment usually involves protecting the area where the blister has formed until the skin has healed. Other treatments may include:  A bandage (dressing) to cover the blister.  Extra padding around and over the blister, so that it does not rub on anything.  Antibiotic ointment. Most blisters break open, dry up, and go away on their own within 1-2 weeks. Blisters that are very painful may be drained before they break open on their own. If the blister is large or painful, it can be  drained by: 1. Sterilizing a small needle with rubbing alcohol. 2. Washing your hands with soap and water. 3. Inserting the needle in the edge of the blister to make a small hole. Some fluid will drain out of the hole. Let the top or roof of the blister stay in place. This helps the skin heal. 4. Washing the blister with mild soap and water. 5. Covering the blister with antibiotic ointment, if prescribed by your health care provider, and a dressing. Some blisters may need to be drained by a health care provider. Follow these instructions at home:  Protect the area where the blister has formed as told by your health care provider.  Keep your blister clean and dry. This helps to prevent infection.  Do not pop your blister. This can cause infection.  If you were prescribed an antibiotic, use it as told by your health care provider. Do not stop using the antibiotic even if your condition improves.  Wear different shoes until the blister heals.  Avoid the activity that caused the blister until your blister heals.  Check your blister every day for signs of infection. Check for: ? More redness, swelling, or pain. ? More fluid or blood. ? Warmth. ? Pus or a bad smell. ? The blister getting better and then getting worse. How is this prevented? Taking these steps can help to prevent blisters that are caused by friction. Make sure you:  Wear comfortable  shoes that fit well.  Always wear socks with shoes.  Wear extra socks or use tape, bandages, or pads over blister-prone areas as needed. You may also apply petroleum jelly under bandages in blister-prone areas.  Wear protective gear, such as gloves, when participating in sports or activities that can cause blisters.  Wear loose-fitting, moisture-wicking clothes when participating in sports or activities.  Use powders as needed to keep your feet dry. Contact a health care provider if:  You have more redness, swelling, or pain around  your blister.  You have more fluid or blood coming from your blister.  Your blister feels warm to the touch.  You have pus or a bad smell coming from your blister.  You have a fever or chills.  Your blister gets better and then it gets worse. This information is not intended to replace advice given to you by your health care provider. Make sure you discuss any questions you have with your health care provider. Document Revised: 08/12/2017 Document Reviewed: 03/12/2016 Elsevier Patient Education  2020 [employer].

## 2020-06-23 ENCOUNTER — Other Ambulatory Visit: Payer: Self-pay

## 2020-06-23 ENCOUNTER — Encounter: Payer: Self-pay | Admitting: Family Medicine

## 2020-06-23 ENCOUNTER — Ambulatory Visit (INDEPENDENT_AMBULATORY_CARE_PROVIDER_SITE_OTHER): Payer: 59 | Admitting: Family Medicine

## 2020-06-23 VITALS — BP 126/82 | HR 77 | Temp 99.2°F | Resp 18 | Ht 69.0 in | Wt 234.2 lb

## 2020-06-23 DIAGNOSIS — Z0000 Encounter for general adult medical examination without abnormal findings: Secondary | ICD-10-CM

## 2020-06-23 DIAGNOSIS — E119 Type 2 diabetes mellitus without complications: Secondary | ICD-10-CM

## 2020-06-23 DIAGNOSIS — F325 Major depressive disorder, single episode, in full remission: Secondary | ICD-10-CM | POA: Diagnosis not present

## 2020-06-23 DIAGNOSIS — Z125 Encounter for screening for malignant neoplasm of prostate: Secondary | ICD-10-CM

## 2020-06-23 DIAGNOSIS — Z933 Colostomy status: Secondary | ICD-10-CM

## 2020-06-23 MED ORDER — CITALOPRAM HYDROBROMIDE 10 MG PO TABS: 10.0000 mg | ORAL_TABLET | Freq: Every day | ORAL | 3 refills | Status: DC

## 2020-06-23 NOTE — Patient Instructions (Addendum)
Health Maintenance Due  Topic Date Due  . PNEUMOCOCCAL POLYSACCHARIDE VACCINE AGE 64-64 HIGH RISK - prefers to wait until 65 Never done  . TETANUS/TDAP - declines for now unless gets injury/cut/scrape 09/17/2008  . URINE MICROALBUMIN - next visit 01/31/2018  . INFLUENZA VACCINE Declined in office flu shot  Never done   prioritize covid 19 booster on October 21st or later  Schedule dentist visit and updated eye exam  Decrease citalopram to 67m when you run out of 280mpills. If worsening depression/irritability can restart 2041motherwise reassess in 6 months and may even trial off   Thrilled for a good stretch lately for you!   Recommended follow up: Return in about 6 months (around 12/22/2020) for follow up- or sooner if needed.   Please stop by lab before you go If you have mychart- we will send your results within 3 business days of us Koreaceiving them.  If you do not have mychart- we will call you about results within 5 business days of us Koreaceiving them.  *please note we are currently using [facility] which has a longer processing time than [place] typically so labs may not come back as quickly as in the past *please also note that you will see labs on mychart as soon as they post. I will later go in and write notes on them- will say "notes from Dr. HunYong Channel

## 2020-06-23 NOTE — Addendum Note (Signed)
Addended by: Liliane Channel on: 06/23/2020 03:41 PM   Modules accepted: Orders

## 2020-06-23 NOTE — Progress Notes (Signed)
Phone: [phone]   Subjective:  Patient presents today for their annual physical. Chief complaint-noted.   See problem oriented charting- ROS- full  review of systems was completed and negative  Per full ROS sheet  The following were reviewed and entered/updated in epic: Past Medical History:  Diagnosis Date  . ALLERGIC RHINITIS   . Cancer ([place])    Lymphoma   . Diabetes mellitus   . History of deep venous thrombosis (DVT) of distal vein of right lower extremity    Also bilateral PE.  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 so was considered provoked  . Hyperlipidemia    Patient Active Problem List   Diagnosis Date Noted  . Colostomy status ([place]) 02/02/2020    Priority: High  . Status post colectomy 10/09/2019    Priority: High  . Bowel obstruction in setting of lymphoma and sigmoid stricture 07/21/2018    Priority: High  . 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 History of pulmonary embolism 03/10/2018    Priority: High  . Controlled diabetes mellitus type II without complication ([place]) 82/50/5397    Priority: High  . Depression, major, single episode, complete remission ([place]) 04/17/2019    Priority: Medium  . Hypomagnesemia 01/11/2019    Priority: Medium  . Edema     Priority: Medium  . Tachycardia 01/31/2017    Priority: Medium  . Hypertension associated with diabetes ([place]) 06/05/2014    Priority: Medium  . Hyperlipidemia associated with type 2 diabetes mellitus ([place]) 05/12/2007    Priority: Medium  . Radiation gastroenteritis     Priority: Low  . Radiation colitis     Priority: Low  . Loop ilesotomy for fecal diversion of sigmoid stricture 08/25/2018    Priority: Low  . 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 Counseling regarding advance care planning and goals of care 04/25/2018    Priority: Low  . Dysmetabolic syndrome X 67/34[birth year]1937     Priority: Low  . BACK PAIN WITH RADICULOPATHY 06/09/2007    Priority: Low  . ALLERGIC RHINITIS 05/12/2007    Priority: Low  . Goals of care, counseling/discussion   . Port-A-Cath in place 03/27/2018   Past Surgical History:  Procedure Laterality Date  . BIOPSY  03/15/2018   Procedure: BIOPSY;  Surgeon: Milus Banister, MD;  Location: [name];  Service: Endoscopy;;  . BIOPSY  03/01/2019   Procedure: BIOPSY;  Surgeon: Ladene Artist, MD;  Location: [facility];  Service: Endoscopy;;  . BOWEL RESECTION N/A 08/25/2018   Procedure: LAPROSCOPIC LOOP ILEOSTOMY;  Surgeon: Greer Pickerel, MD;  Location: [facility];  Service: General;  Laterality: N/A;  . CLEFT PALATE REPAIR    . COLECTOMY  07/30/2019  . COLONOSCOPY N/A 03/15/2018   Procedure: COLONOSCOPY;  Surgeon: Milus Banister, MD;  Location: [facility];  Service: Endoscopy;  Laterality: N/A;  . COLONOSCOPY N/A 08/20/2018   Procedure: COLONOSCOPY;  Surgeon: Irene Shipper, MD;  Location: [facility];  Service: Endoscopy;  Laterality: N/A;  . deviated septum repair     slight improvement  . ESOPHAGOGASTRODUODENOSCOPY N/A 03/15/2018   Procedure: ESOPHAGOGASTRODUODENOSCOPY (EGD);  Surgeon: Milus Banister, MD;  Location: [name];  Service: Endoscopy;  Laterality: N/A;  . FLEXIBLE SIGMOIDOSCOPY N/A 03/01/2019   Procedure: FLEXIBLE SIGMOIDOSCOPY;  Surgeon: Ladene Artist, MD;  Location: [facility];  Service: Endoscopy;  Laterality: N/A;  .  ILEOSCOPY N/A 03/01/2019   Procedure: ILEOSCOPY THROUGH STOMA;  Surgeon: Ladene Artist, MD;  Location: [name];  Service: Endoscopy;  Laterality: N/A;  . IR IMAGING GUIDED PORT INSERTION  03/17/2018  . LAPAROSCOPY N/A 08/25/2018   Procedure: LAPAROSCOPY DIAGNOSTIC;  Surgeon: Greer Pickerel, MD;  Location: [facility];  Service: General;  Laterality: N/A;  . TONSILLECTOMY      Family History  Problem Relation Age of Onset  . Lung cancer Mother        smoker  . Brain cancer Mother         metastasis  . AAA (abdominal aortic aneurysm) Father        smoker    Medications- reviewed and updated Current Outpatient Medications  Medication Sig Dispense Refill  . acetaminophen (TYLENOL) 325 MG tablet Take 2 tablets (650 mg total) by mouth every 6 (six) hours as needed for mild pain (or temp > 100).    . B Complex Vitamins (VITAMIN B COMPLEX PO) Take 1 tablet by mouth daily.     . blood glucose meter kit and supplies Dispense based on patient and insurance preference. Use daily as directed. (E11.9). 1 each 0  . citalopram (CELEXA) 20 MG tablet Take 1 tablet (20 mg total) by mouth daily. 90 tablet 3  . glucose blood (FREESTYLE TEST STRIPS) test strip Use to check blood sugar daily 100 each 4  . loperamide (IMODIUM) 2 MG capsule Take 2 mg by mouth as needed for diarrhea or loose stools.    [name] MAGNESIUM CARBONATE PO Take 800 mg by mouth daily.     No current facility-administered medications for this visit.    Allergies-reviewed and updated Allergies  Allergen Reactions  . Ciprofloxacin Other (See Comments)    Leg tingling    Social History   Social History Narrative   Married 1985. No kids. 4 small dogs.       Works in Financial trader, residential      Hobbies: work on cars, Haematologist, exercise as able   Objective  Objective:  BP 126/82   Pulse 77   Temp 99.2 F (37.3 C) (Temporal)   Resp 18   Ht 5' 9" (1.753 m)   Wt 234 lb 3.2 oz (106.2 kg)   SpO2 99%   BMI 34.59 kg/m  Gen: NAD, resting comfortably HEENT: Mucous membranes are moist. Oropharynx normal Neck: no thyromegaly CV: RRR no murmurs rubs or gallops Lungs: CTAB no crackles, wheeze, rhonchi Abdomen: soft/nontender/nondistended/normal bowel sounds. No rebound or guarding. Ostomy site in place/noted.  Ext: no edema Skin: warm, dry Neuro: grossly normal, moves all extremities, PERRLA  Diabetic Foot Exam - Simple   Simple Foot Form Diabetic Foot exam was performed with the following findings: Yes  06/23/2020  3:23 PM  Visual Inspection No deformities, no ulcerations, no other skin breakdown bilaterally: Yes Sensation Testing Intact to touch and monofilament testing bilaterally: Yes Pulse Check Posterior Tibialis and Dorsalis pulse intact bilaterally: Yes Comments       Assessment and Plan  64 y.o. male presenting for annual physical.  Health Maintenance counseling: 1. Anticipatory guidance: Patient counseled regarding regular dental exams - advised q6 months, eye exams - advised yearly,  avoiding smoking and second hand smoke, limiting alcohol to 2 beverages per day- none at present.   2. Risk factor reduction:  Advised patient of need for regular exercise and diet rich and fruits and vegetables to reduce risk of heart attack and stroke. Exercise- treadmill twice a week  15 minutes- encouraged to work towards 150 minutes a week. Diet-trying to improve on diet- wants to stabilize current weight.  Wt Readings from Last 3 Encounters:  06/23/20 234 lb 3.2 oz (106.2 kg)  06/04/20 234 lb 8 oz (106.4 kg)  05/09/20 224 lb 8 oz (101.8 kg)  3. Immunizations/screenings/ancillary studies- prioritize covid 19 booster on October 21st or later. Declines flu shot. Declines pneumovax. Declines tdap for now.   Urine microalbumin/cr ratio next visit as already urinated Immunization History  Administered Date(s) Administered  . PFIZER SARS-COV-2 Vaccination 12/19/2019, 01/02/2020  . Td 09/17/1998  . Zoster Recombinat (Shingrix) 12/28/2017, 02/01/2020  4. Prostate cancer screening- low risk prior PSA trend- will check again with labs today- had been delayed to to lymphoma work up  Lab Results  Component Value Date   PSA 0.87 08/29/2017   PSA 0.72 10/21/2015   PSA 0.94 06/05/2014   5. Colon cancer screening - large intestine removed due to prior surgery. Last colonoscopy 03/01/2019 will discontinue screenings.  6. Skin cancer screening- does not see dermatology. advised regular sunscreen use.  Denies worrisome, changing, or new skin lesions.  7. Never smoker 8. STD screening - monogamous  Status of chronic or acute concerns   % History of high-grade B-cell lymphoma/Burkitt lymphoma  -Discovered on CT March 10, 2018-on initial measures 18.7 x 18.5 x 17.8 cm -S/p IMRT with 36 Gy over 20 fractions, between 11/09/18 and 12/06/18 with Dr. Isidore Moos -Patient completed chemotherapy October 2019 -Patient later developed small bowel obstruction/ileus-sigmoid obstructive lesion with question of scar tissue from resolved lymphoma versus postinflammatory scarring from diverticulitis/limited perforation-ultimately required diverting ileostomy 08/25/2018 with Dr. Greer Pickerel -Patient ultimately had severe pancolitis thought related to radiation toxicity with recurrent abscesses and sepsis -[facility] Victorio [facility] ultimately performed colectomy July 30 2019-permanent colostomy in place after multiple episodes of diverticulitis/stricture episodes after radiation for burkitt lymphoma -Patient remains on chronic magnesium-we will check magnesium each set of blood work- was having diarrhea but now better with forumula from sprouts  -also on chronic immodium post surgery -January 09, 2020 with Dr. Jacquelynn Cree counts steady and no lab or clinical evidence of recurrence of Burkitt's lymphoma 18 months out from treatment-now on 4-58-monthschedule. Next visit November- stretching out to 6 months and may go to 9 months   #hypertension-has been off medication since weight loss related to Burkitt lymphoma and subsequent complications S: medication: None  A/P: looks good without meds. Continue to monitor.    # Diabetes-has been off medication since weight loss related to Burkitt lymphoma and subsequent complications S: Medication: None  CBGs-does not check  Exercise and diet-peak weight 298 prior to Burkitt's lymphoma- wants to maintaincurrent weight   A/P: hopefully stable- update a1c   # Depression S: Medication:  celexa 23m -If hospitalization stabilized over 6 months timeframe.  We would like to consider reducing dose [facility] 06/23/2020 02/01/2020 10/09/2019  Decreased Interest 0 0 0  Down, Depressed, Hopeless - 0 0  PHQ - 2 Score 0 0 0  Altered sleeping 0 0 0  Tired, decreased energy 0 0 0  Change in appetite 0 0 0  Feeling bad or failure about yourself  0 0 0  Trouble concentrating 0 0 0  Moving slowly or fidgety/restless 0 0 0  Suicidal thoughts 0 0 0  PHQ-9 Score 0 0 0  Difficult doing work/chores - Not difficult at all Not difficult at all  Some recent data might be hidden  A/P:  Continues to be well controlled with citalopram 20 mg-we opted to decrease to 10 mg since he has done so well and has had such a nice stretch out of the hospital  #Edema -  - has improved recently. - cutting back on sodas helped. Prior blisteres resolved -Blood work reassuring though declines BNP -Could consider echocardiogram if worsens  #hyperlipidemia- refuses statin despite diabetes S: Medication: None  Lab Results  Component Value Date   CHOL 140 02/01/2020   HDL 32.20 (L) 02/01/2020   LDLCALC 112 (H) 08/29/2017   LDLDIRECT 80.0 02/01/2020   TRIG 207.0 (H) 02/01/2020   CHOLHDL 4 02/01/2020  A/P: declines statin and coronary calcium scoring for now.    Recommended follow up: Return in about 6 months (around 12/22/2020) for follow up- or sooner if needed. Future Appointments  Date Time Provider [place]  07/28/2020  9:30 AM [facility] None  07/28/2020  9:45 AM [facility] [place] [facility] None  07/28/2020 10:20 AM [name], MD [facility] None    Chief Complaint  Patient presents with  . Annual Exam   Lab/Order associations:Non fasting   ICD-10-CM   1. Depression, major, single episode, complete remission ([place])  F32.5     No orders of the defined types were placed in this encounter.   Return precautions advised.  Garret Reddish,  MD

## 2020-06-24 ENCOUNTER — Telehealth: Payer: Self-pay

## 2020-06-24 LAB — COMPLETE METABOLIC PANEL WITH GFR
AG Ratio: 1.4 (calc) (ref 1.0–2.5)
ALT: 69 U/L — ABNORMAL HIGH (ref 9–46)
AST: 42 U/L — ABNORMAL HIGH (ref 10–35)
Albumin: 4.3 g/dL (ref 3.6–5.1)
Alkaline phosphatase (APISO): 78 U/L (ref 35–144)
BUN: 18 mg/dL (ref 7–25)
CO2: 29 mmol/L (ref 20–32)
Calcium: 10.2 mg/dL (ref 8.6–10.3)
Chloride: 106 mmol/L (ref 98–110)
Creat: 1.08 mg/dL (ref 0.70–1.25)
GFR, Est African American: 84 mL/min/{1.73_m2} (ref >=60)
GFR, Est Non African American: 73 mL/min/{1.73_m2} (ref >=60)
Globulin: 3 g/dL (calc) (ref 1.9–3.7)
Glucose, Bld: 85 mg/dL (ref 65–99)
Potassium: 5 mmol/L (ref 3.5–5.3)
Sodium: 141 mmol/L (ref 135–146)
Total Bilirubin: 0.3 mg/dL (ref 0.2–1.2)
Total Protein: 7.3 g/dL (ref 6.1–8.1)

## 2020-06-24 LAB — LIPID PANEL (REFL)
Cholesterol: 144 mg/dL (ref <200)
HDL: 36 mg/dL — ABNORMAL LOW (ref >=40)
LDL Cholesterol (Calc): 82 mg/dL (calc)
Non-HDL Cholesterol (Calc): 108 mg/dL (calc) (ref <130)
Total CHOL/HDL Ratio: 4 (calc) (ref <5.0)
Triglycerides: 154 mg/dL — ABNORMAL HIGH (ref <150)

## 2020-06-24 LAB — CBC WITH DIFFERENTIAL/PLATELET
Absolute Monocytes: 527 cells/uL (ref 200–950)
Basophils Absolute: 41 cells/uL (ref 0–200)
Basophils Relative: 0.9 %
Eosinophils Absolute: 68 cells/uL (ref 15–500)
Eosinophils Relative: 1.5 %
HCT: 38.5 % (ref 38.5–50.0)
Hemoglobin: 12.8 g/dL — ABNORMAL LOW (ref 13.2–17.1)
Lymphs Abs: 1598 cells/uL (ref 850–3900)
MCH: 33.4 pg — ABNORMAL HIGH (ref 27.0–33.0)
MCHC: 33.2 g/dL (ref 32.0–36.0)
MCV: 100.5 fL — ABNORMAL HIGH (ref 80.0–100.0)
MPV: 8.7 fL (ref 7.5–12.5)
Monocytes Relative: 11.7 %
Neutro Abs: 2268 cells/uL (ref 1500–7800)
Neutrophils Relative %: 50.4 %
Platelets: 238 10*3/uL (ref 140–400)
RBC: 3.83 10*6/uL — ABNORMAL LOW (ref 4.20–5.80)
RDW: 13.2 % (ref 11.0–15.0)
Total Lymphocyte: 35.5 %
WBC: 4.5 10*3/uL (ref 3.8–10.8)

## 2020-06-24 LAB — HEMOGLOBIN A1C
Hgb A1c MFr Bld: 5.8 % of total Hgb — ABNORMAL HIGH (ref <5.7)
Mean Plasma Glucose: 120 (calc)
eAG (mmol/L): 6.6 (calc)

## 2020-06-24 LAB — PSA: PSA: 0.53 ng/mL (ref <=4.0)

## 2020-06-24 LAB — MAGNESIUM: Magnesium: 1.8 mg/dL (ref 1.5–2.5)

## 2020-06-24 NOTE — Telephone Encounter (Signed)
Returned call and lm tcb.

## 2020-06-24 NOTE — Telephone Encounter (Signed)
Patients wife calling back about lab results.

## 2020-06-30 ENCOUNTER — Other Ambulatory Visit: Payer: Self-pay | Admitting: Family Medicine

## 2020-06-30 DIAGNOSIS — F325 Major depressive disorder, single episode, in full remission: Secondary | ICD-10-CM

## 2020-07-09 ENCOUNTER — Other Ambulatory Visit: Payer: Self-pay

## 2020-07-09 ENCOUNTER — Encounter: Payer: Self-pay | Admitting: Family Medicine

## 2020-07-09 MED ORDER — CITALOPRAM HYDROBROMIDE 10 MG PO TABS: 10.0000 mg | ORAL_TABLET | Freq: Every day | ORAL | 3 refills | Status: DC

## 2020-07-28 ENCOUNTER — Inpatient Hospital Stay: Payer: 59

## 2020-07-28 ENCOUNTER — Inpatient Hospital Stay: Payer: 59 | Attending: Hematology

## 2020-07-28 ENCOUNTER — Other Ambulatory Visit: Payer: Self-pay

## 2020-07-28 ENCOUNTER — Inpatient Hospital Stay (HOSPITAL_BASED_OUTPATIENT_CLINIC_OR_DEPARTMENT_OTHER): Payer: 59 | Admitting: Hematology

## 2020-07-28 VITALS — BP 140/90 | HR 70 | Temp 97.1°F | Resp 18 | Ht 69.0 in | Wt 243.9 lb

## 2020-07-28 DIAGNOSIS — C8510 Unspecified B-cell lymphoma, unspecified site: Secondary | ICD-10-CM

## 2020-07-28 DIAGNOSIS — K921 Melena: Secondary | ICD-10-CM | POA: Insufficient documentation

## 2020-07-28 DIAGNOSIS — Z801 Family history of malignant neoplasm of trachea, bronchus and lung: Secondary | ICD-10-CM | POA: Diagnosis not present

## 2020-07-28 DIAGNOSIS — Z95828 Presence of other vascular implants and grafts: Secondary | ICD-10-CM

## 2020-07-28 DIAGNOSIS — E119 Type 2 diabetes mellitus without complications: Secondary | ICD-10-CM | POA: Diagnosis not present

## 2020-07-28 DIAGNOSIS — K912 Postsurgical malabsorption, not elsewhere classified: Secondary | ICD-10-CM | POA: Insufficient documentation

## 2020-07-28 DIAGNOSIS — Z8572 Personal history of non-Hodgkin lymphomas: Secondary | ICD-10-CM | POA: Insufficient documentation

## 2020-07-28 DIAGNOSIS — Z7189 Other specified counseling: Secondary | ICD-10-CM

## 2020-07-28 DIAGNOSIS — N5089 Other specified disorders of the male genital organs: Secondary | ICD-10-CM | POA: Insufficient documentation

## 2020-07-28 DIAGNOSIS — C8378 Burkitt lymphoma, lymph nodes of multiple sites: Secondary | ICD-10-CM

## 2020-07-28 DIAGNOSIS — M7989 Other specified soft tissue disorders: Secondary | ICD-10-CM | POA: Diagnosis not present

## 2020-07-28 DIAGNOSIS — Z808 Family history of malignant neoplasm of other organs or systems: Secondary | ICD-10-CM | POA: Insufficient documentation

## 2020-07-28 DIAGNOSIS — Z79899 Other long term (current) drug therapy: Secondary | ICD-10-CM | POA: Diagnosis not present

## 2020-07-28 DIAGNOSIS — Z8249 Family history of ischemic heart disease and other diseases of the circulatory system: Secondary | ICD-10-CM | POA: Diagnosis not present

## 2020-07-28 DIAGNOSIS — Z23 Encounter for immunization: Secondary | ICD-10-CM

## 2020-07-28 LAB — CBC WITH DIFFERENTIAL/PLATELET
Abs Immature Granulocytes: 0.01 10*3/uL (ref 0.00–0.07)
Basophils Absolute: 0 10*3/uL (ref 0.0–0.1)
Basophils Relative: 1 %
Eosinophils Absolute: 0.1 10*3/uL (ref 0.0–0.5)
Eosinophils Relative: 1 %
HCT: 37.5 % — ABNORMAL LOW (ref 39.0–52.0)
Hemoglobin: 12.5 g/dL — ABNORMAL LOW (ref 13.0–17.0)
Immature Granulocytes: 0 %
Lymphocytes Relative: 36 %
Lymphs Abs: 1.9 10*3/uL (ref 0.7–4.0)
MCH: 32.9 pg (ref 26.0–34.0)
MCHC: 33.3 g/dL (ref 30.0–36.0)
MCV: 98.7 fL (ref 80.0–100.0)
Monocytes Absolute: 0.5 10*3/uL (ref 0.1–1.0)
Monocytes Relative: 9 %
Neutro Abs: 2.9 10*3/uL (ref 1.7–7.7)
Neutrophils Relative %: 53 %
Platelets: 200 10*3/uL (ref 150–400)
RBC: 3.8 MIL/uL — ABNORMAL LOW (ref 4.22–5.81)
RDW: 14 % (ref 11.5–15.5)
WBC: 5.4 10*3/uL (ref 4.0–10.5)
nRBC: 0 % (ref 0.0–0.2)

## 2020-07-28 LAB — CMP (CANCER CENTER ONLY)
ALT: 55 U/L — ABNORMAL HIGH (ref 0–44)
AST: 31 U/L (ref 15–41)
Albumin: 3.7 g/dL (ref 3.5–5.0)
Alkaline Phosphatase: 69 U/L (ref 38–126)
Anion gap: 6 (ref 5–15)
BUN: 17 mg/dL (ref 8–23)
CO2: 25 mmol/L (ref 22–32)
Calcium: 9.5 mg/dL (ref 8.9–10.3)
Chloride: 106 mmol/L (ref 98–111)
Creatinine: 1.05 mg/dL (ref 0.61–1.24)
GFR, Estimated: >60 mL/min (ref >60)
Glucose, Bld: 96 mg/dL (ref 70–99)
Potassium: 4.2 mmol/L (ref 3.5–5.1)
Sodium: 137 mmol/L (ref 135–145)
Total Bilirubin: 0.4 mg/dL (ref 0.3–1.2)
Total Protein: 7.5 g/dL (ref 6.5–8.1)

## 2020-07-28 LAB — LACTATE DEHYDROGENASE: LDH: 221 U/L — ABNORMAL HIGH (ref 98–192)

## 2020-07-28 LAB — MAGNESIUM: Magnesium: 1.6 mg/dL — ABNORMAL LOW (ref 1.7–2.4)

## 2020-07-28 MED ORDER — HEPARIN SOD (PORK) LOCK FLUSH 100 UNIT/ML IV SOLN
500.0000 [IU] | Freq: Once | INTRAVENOUS | Status: AC
  Administered 2020-07-28: 500 [IU]
  Filled 2020-07-28: qty 5

## 2020-07-28 MED ORDER — SODIUM CHLORIDE 0.9% FLUSH
10.0000 mL | Freq: Once | INTRAVENOUS | Status: AC
  Administered 2020-07-28: 10 mL
  Filled 2020-07-28: qty 10

## 2020-07-28 NOTE — Progress Notes (Signed)
   Covid-19 Vaccination Clinic  Name:  Parrish Bonn    MRN: [id number] DOB: 1956/04/05  07/28/2020  Mr. Hilliker was observed post Covid-19 immunization for 15 minutes without incident. He was provided with Vaccine Information Sheet and instruction to access the V-Safe system.   Mr. Hanley was instructed to call 911 with any severe reactions post vaccine: [name] Difficulty breathing  . Swelling of face and throat  . A fast heartbeat  . A bad rash all over body  . Dizziness and weakness   Immunizations Administered    Name Date Dose VIS Date Route   Pfizer COVID-19 Vaccine 07/28/2020 11:54 AM 0.3 mL 07/02/2020 Intramuscular   Manufacturer: [place]   Lot: [id number]   NDC: [address]-5

## 2020-07-28 NOTE — Progress Notes (Signed)
HEMATOLOGY/ONCOLOGY CLINIC NOTE  Date of Service: 07/28/20    Patient Care Team: Mark Olp, MD as PCP - General (Family Medicine)  CHIEF COMPLAINTS/PURPOSE OF CONSULTATION:  F/u for continued Mx of Burkitts lymphoma  HISTORY OF PRESENTING ILLNESS:   Mark Frey is a wonderful 64 y.o. male who has been referred to [place] by my colleague Dr. 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 He is accompanied today by his wife. The pt reports that he is doing well overall.   The pt started R-EPOCH every 3 weeks with neulasta support on 03/17/18. He presented to the ED with right leg swelling, found to be a DVT and bilateral PE, which resulted in the incidental finding of a lower abdomen tumor measuring 18.7 x 18.5 x 17.8 cm. He denies any abdominal pains or changes in his bowel habits prior to this finding. He first noted blood in the stools 4-5 days prior to appearing to the ED.  The pt reports that he has been taking 126m Lovenox injections once each day, except for the days in which he had blood in the stools. He hasn't had blood in his stools for the last 7 days.   He has had GI bleed related to bowel involvement with his lymphoma - this is now resolving and his hgb is stabilizing.  He notes that his scrotum and leg swelling has decreased. He denies having CP or SOB at any point from his PE.   Most recent lab results (04/06/18) of CBC w/diff, CMP, Reticulocytes  is as follows: all values are WNL except for WBC at 15.3k, RBC at 3.65, HGB at 10.5, HCT at 31.4, RDW at 17.8, PLT at 640k, ANC at 12.7k, Monocytes abs at 1.6k, Glucose at 162, Total Protein at 6.2, Albumin at 3.1, Total bilirubin at <0.2, Retic ct pct at 2.5%. Uric acid 04/06/18 was low at 3.0 LDH 04/06/18 elevated at 251  On review of systems, pt reports remaining right leg swelling, blood in the stools 7 days ago, two  bowel movements each day, left leg swelling, improved scrotal swelling, eating well, and denies problems with his port, abdominal pains, constipation, diarrhea, jaw pain, pain along the spine, problems passing urine, and any other symptoms.  Interval History:  DDelmar Arriagawas seen in f/u of his Burkitt's lymphoma. We are joined today by [name] his wife. The patient's last visit with [place] on 04/28/2020. The pt reports that he is doing well overall.  The pt reports that he has felt well in the interim. He denies any new symptoms. Pt has received his COVID19 vaccines, but has not his booster. He is not interested in receiving the flu vaccine.  Lab results today (07/28/20) of CBC w/diff and CMP is as follows: all values are WNL except for RBC at 3.80, Hgb at 12.5, HCT at 37.5, ALT at 55. 07/28/2020 LDH at 221 07/28/2020 Magnesium at 1.6  On review of systems, pt denies low appetite, unexpected weight loss, abdominal pain and any other symptoms.   MEDICAL HISTORY:  Past Medical History:  Diagnosis Date  . ALLERGIC RHINITIS   . Cancer [place]    Lymphoma   . Diabetes mellitus   . History of deep venous thrombosis (DVT) of distal vein of right lower extremity    Also bilateral PE.  This was at the time Burkitt's lymphoma was discovered around June  2019 so was considered provoked  . Hyperlipidemia     SURGICAL HISTORY: Past Surgical History:  Procedure Laterality Date  . BIOPSY  03/15/2018   Procedure: BIOPSY;  Surgeon: Milus Banister, MD;  Location: [name];  Service: Endoscopy;;  . BIOPSY  03/01/2019   Procedure: BIOPSY;  Surgeon: Ladene Artist, MD;  Location: [facility];  Service: Endoscopy;;  . BOWEL RESECTION N/A 08/25/2018   Procedure: LAPROSCOPIC LOOP ILEOSTOMY;  Surgeon: Greer Pickerel, MD;  Location: [facility];  Service: General;  Laterality: N/A;  . CLEFT PALATE REPAIR    . COLECTOMY  07/30/2019  . COLONOSCOPY N/A 03/15/2018   Procedure: COLONOSCOPY;  Surgeon: Milus Banister, MD;  Location: [facility];  Service: Endoscopy;  Laterality: N/A;  . COLONOSCOPY N/A 08/20/2018   Procedure: COLONOSCOPY;  Surgeon: Irene Shipper, MD;  Location: [facility];  Service: Endoscopy;  Laterality: N/A;  . deviated septum repair     slight improvement  . ESOPHAGOGASTRODUODENOSCOPY N/A 03/15/2018   Procedure: ESOPHAGOGASTRODUODENOSCOPY (EGD);  Surgeon: Milus Banister, MD;  Location: [name];  Service: Endoscopy;  Laterality: N/A;  . FLEXIBLE SIGMOIDOSCOPY N/A 03/01/2019   Procedure: FLEXIBLE SIGMOIDOSCOPY;  Surgeon: Ladene Artist, MD;  Location: [facility];  Service: Endoscopy;  Laterality: N/A;  . ILEOSCOPY N/A 03/01/2019   Procedure: ILEOSCOPY THROUGH STOMA;  Surgeon: Ladene Artist, MD;  Location: [facility];  Service: Endoscopy;  Laterality: N/A;  . IR IMAGING GUIDED PORT INSERTION  03/17/2018  . LAPAROSCOPY N/A 08/25/2018   Procedure: LAPAROSCOPY DIAGNOSTIC;  Surgeon: Greer Pickerel, MD;  Location: [facility];  Service: General;  Laterality: N/A;  . TONSILLECTOMY      SOCIAL HISTORY: Social History   Socioeconomic History  . Marital status: Married    Spouse name: Mark Frey  . Number of children: 0  . Years of education: Not on file  . Highest education level: Not on file  Occupational History  . Not on file  Tobacco Use  . Smoking status: Never Smoker  . Smokeless tobacco: Never Used  Vaping Use  . Vaping Use: Never used  Substance and Sexual Activity  . Alcohol use: Yes    Comment: occasional  . Drug use: No  . Sexual activity: Yes  Other Topics Concern  . Not on file  Social History Narrative   Married 1985. No kids. 4 small dogs.       Works in Financial trader, residential      Hobbies: work on cars, Haematologist, exercise as able   Social Determinants of Radio broadcast assistant Strain:   . Difficulty of Paying Living Expenses: Not on file  Food Insecurity:   . Worried About Charity fundraiser in the Last Year: Not on file  .  Ran Out of Food in the Last Year: Not on file  Transportation Needs:   . Lack of Transportation (Medical): Not on file  . Lack of Transportation (Non-Medical): Not on file  Physical Activity:   . Days of Exercise per Week: Not on file  . Minutes of Exercise per Session: Not on file  Stress:   . Feeling of Stress : Not on file  Social Connections:   . Frequency of Communication with Friends and Family: Not on file  . Frequency of Social Gatherings with Friends and Family: Not on file  . Attends Religious Services: Not on file  . Active Member of Clubs or Organizations: Not on file  . Attends Archivist Meetings:  Not on file  . Marital Status: Not on file  Intimate Partner Violence:   . Fear of Current or Ex-Partner: Not on file  . Emotionally Abused: Not on file  . Physically Abused: Not on file  . Sexually Abused: Not on file    FAMILY HISTORY: Family History  Problem Relation Age of Onset  . Lung cancer Mother        smoker  . Brain cancer Mother        metastasis  . AAA (abdominal aortic aneurysm) Father        smoker    ALLERGIES:  is allergic to ciprofloxacin.  MEDICATIONS:  Current Outpatient Medications  Medication Sig Dispense Refill  . acetaminophen (TYLENOL) 325 MG tablet Take 2 tablets (650 mg total) by mouth every 6 (six) hours as needed for mild pain (or temp > 100).    . B Complex Vitamins (VITAMIN B COMPLEX PO) Take 1 tablet by mouth daily.     . blood glucose meter kit and supplies Dispense based on patient and insurance preference. Use daily as directed. (E11.9). 1 each 0  . citalopram (CELEXA) 10 MG tablet Take 1 tablet (10 mg total) by mouth daily. 90 tablet 3  . glucose blood (FREESTYLE TEST STRIPS) test strip Use to check blood sugar daily 100 each 4  . loperamide (IMODIUM) 2 MG capsule Take 2 mg by mouth as needed for diarrhea or loose stools.    [name] MAGNESIUM CARBONATE PO Take 800 mg by mouth daily.     No current facility-administered  medications for this visit.    REVIEW OF SYSTEMS:   A 10+ POINT REVIEW OF SYSTEMS WAS OBTAINED including neurology, dermatology, psychiatry, cardiac, respiratory, lymph, extremities, GI, GU, Musculoskeletal, constitutional, breasts, reproductive, HEENT.  All pertinent positives are noted in the HPI.  All others are negative.   PHYSICAL EXAMINATION: ECOG FS:2 - Symptomatic, <50% confined to bed  Vitals:   07/28/20 1042  BP: 140/90  Pulse: 70  Resp: 18  Temp: (!) 97.1 F (36.2 C)  SpO2: 99%   Wt Readings from Last 3 Encounters:  07/28/20 243 lb 14.4 oz (110.6 kg)  06/23/20 234 lb 3.2 oz (106.2 kg)  06/04/20 234 lb 8 oz (106.4 kg)   Body mass index is 36.02 kg/m.    GENERAL:alert, in no acute distress and comfortable SKIN: no acute rashes, no significant lesions EYES: conjunctiva are pink and non-injected, sclera anicteric OROPHARYNX: MMM, no exudates, no oropharyngeal erythema or ulceration NECK: supple, no JVD LYMPH:  no palpable lymphadenopathy in the cervical, axillary or inguinal regions LUNGS: clear to auscultation b/l with normal respiratory effort HEART: regular rate & rhythm ABDOMEN:  normoactive bowel sounds , non tender, not distended. No palpable hepatosplenomegaly.  Extremity: no pedal edema PSYCH: alert & oriented x 3 with fluent speech NEURO: no focal motor/sensory deficits  LABORATORY DATA:   CBC Latest Ref Rng & Units 07/28/2020 06/23/2020 04/28/2020  WBC 4.0 - 10.5 K/uL 5.4 4.5 5.0  Hemoglobin 13.0 - 17.0 g/dL 12.5(L) 12.8(L) 13.0  Hematocrit 39 - 52 % 37.5(L) 38.5 40.2  Platelets 150 - 400 K/uL 200 238 201     CMP Latest Ref Rng & Units 07/28/2020 06/23/2020 04/28/2020  Glucose 70 - 99 mg/dL 96 85 108(H)  BUN 8 - 23 mg/dL 17 18 18   Creatinine 0.61 - 1.24 mg/dL 1.05 1.08 1.24  Sodium 135 - 145 mmol/L 137 141 140  Potassium 3.5 - 5.1 mmol/L 4.2 5.0 4.3  Chloride  98 - 111 mmol/L 106 106 106  CO2 22 - 32 mmol/L 25 29 26   Calcium 8.9 - 10.3 mg/dL 9.5  10.2 10.3  Total Protein 6.5 - 8.1 g/dL 7.5 7.3 7.8  Total Bilirubin 0.3 - 1.2 mg/dL 0.4 0.3 0.3  Alkaline Phos 38 - 126 U/L 69 - 94  AST 15 - 41 U/L 31 42(H) 35  ALT 0 - 44 U/L 55(H) 69(H) 64(H)   Lab Results  Component Value Date   LDH 221 (H) 07/28/2020    03/01/19 Colonoscopy biopsies:     03/17/18 Cytogenetics:   03/17/18 Colon Bx:   03/13/18 Peritoneum Biopsy:     07/02/2019 PORTABLE CHEST 1 VIEW (Accession [id number])    07/02/2019 CT ABDOMEN AND PELVIS WITH CONTRAST (Accession [id number])  RADIOGRAPHIC STUDIES:  I have personally reviewed the radiological images as listed and agreed with the findings in the report. No results found.  ASSESSMENT & PLAN:   64 y.o. male with  1. 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 03/10/18 CT A/P revealed Large irregular infiltrative solid mass in the right lower quadrant measuring up to 18.7 x 18.5 x 17.8 cm, infiltrating and encasing multiple distal small bowel loops and likely the ileocecal region, partially encasing the sigmoid colon, with prominent extension into the right lower retroperitoneum and extraperitoneal right pelvis with encasement of right external iliac and proximal right common iliac vasculature and infiltration of the right iliopsoas muscle. No BM or CNS involvement.   04/18/18 PET/CT revealed Continued improvement in right colonic wall thickening and surrounding bulky masses consistent with treated lymphoma. No hypermetabolic activity to suggest residual tumor. There is some hypermetabolic activity associated with the sigmoid colon which demonstrates mild wall thickening, surrounding inflammation and underlying diverticulosis, suggesting mild diverticulitis. Suspected treatment related changes throughout the bone marrow. There is mildly increased activity within the clivus without clear corresponding finding on the CT images. Attention on  follow-up recommended. Nonobstructing right renal calculi. 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   06/09/18 PET/CT revealedSoft tissue lesion within the ventral pelvis is again identified demonstrating mild to moderate increased uptake within SUV max of 5.61. Deauville criteria 4. Concerning for residual metabolically active tumor. 2. Similar appearance of diffusely thickened appendix within the right lower quadrant of the abdomen. Findings may reflect treatment related changes. 3. Increased radiotracer uptake throughout the bone marrow which is favored to represent treatment related changes. 4. Lad coronary artery atherosclerotic calcifications.   patient completed planned chemorx on 10/21  10/05/18 CT A/P revealed Persistent wall thickening in the sigmoid colon although a component of this could be due to diverticulosis. There is also a suggestion of wall thickening and potential mucosal enhancement in the terminal ileum, as well as adjacent indistinct stranding in the mesenteric and omental adipose tissues roughly similar to the prior exam. Some of this may be treatment related. Inflammatory process such as Crohn's disease not excluded. 2. No current adenopathy. 3. Other imaging findings of potential clinical significance: Nonobstructive right nephrolithiasis. Aortic Atherosclerosis. Moderate prostatomegaly. Left foraminal impingement at L4-5.  S/p IMRT with 36 Gy over 20 fractions, between 11/09/18 and 12/06/18 with Dr. Isidore Moos  05/24/2019 CT CAP revealed "1. Overall little interval change from CT 05/09/2019. Persistent inflamed loop of distal sigmoid colon with submucosal edema. Potential fistulous communication versus tethering to the ascending colon and a loop of small bowel. Findings are suggestive of inflammatory bowel disease. 2. No evidence of bowel obstruction. Oral  contrast enters the ileostomy bag with bowel obstruction or small bowel dilatation."  2.H/oRLE DVT and b/l PEon anticoagulation.    3. Small bowel obstruction/ileus with sigmoid thickening/stenosis .  CT scan - sigmoid obstructive lesion ? Scar tissue from resolved lymphoma vs residual lymphoma vs post inflammatory scarring from diverticulitis/limited perforation. Also has SBO from adhesions. Colonoscopy 08/21/2018 - no intramucosal lesions. Extrinsic compression of sigmoid colon. S/p diverting ileostomy on 08/25/18 with Dr. Greer Pickerel  4.  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  GI panel and C. difficile negative.  CMV IgM negative, EBV IgM negative. 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 Recurrent abscesses and sepsis. Now s/p colectomy  --surgical pathology with no residual lymphoma.  5. Short gut syndrome with high stool output.  6. H/o hypomagnesemia - on replacement.  6. S/p  Pancytopenia   PLAN: -Discussed pt labwork today, 07/28/20; blood counts and chemistries look good, ALT is elevated, Magnesium is holding well, LDH is borderline elevated. -No lab or clinical evidence of Burkitt's lymphoma recurrence at this time. Pt is now two years out from treatment. -Due to the stability of pt's labs and clinic visits will space out f/u.  -Discussed CDC guidelines regarding the COVID19 booster. Will give in clinic today.  -Recommend pt receive the annual flu vaccine - pt declines at this time.  -Will discuss Port-a-cath removal at next visit - port flush in 3 months.  -Will see back in 6 months with labs   FOLLOW UP: Covid booster vaccine today Portflush in 3 months RTC with Dr [name] with portflush, labs and MD visit in 6 months   The total time spent in the appt was 20 minutes and more than 50% was on counseling and direct patient cares.  All of the patient's questions were answered with apparent satisfaction. The patient knows to call the clinic with any problems, questions or concerns.   Sullivan Lone MD [place] AAHIVMS [facility]  [facility] Hematology/Oncology Physician [facility]  (Office):       [phone] (Work cell):  [phone] (Fax):           [phone]   I, Yevette Edwards, am acting as a scribe for Dr. Sullivan Lone.   .I have reviewed the above documentation for accuracy and completeness, and I agree with the above. [name] MD

## 2020-10-27 ENCOUNTER — Other Ambulatory Visit: Payer: Self-pay

## 2020-10-27 ENCOUNTER — Inpatient Hospital Stay: Payer: 59 | Attending: Hematology

## 2020-10-27 DIAGNOSIS — Z8572 Personal history of non-Hodgkin lymphomas: Secondary | ICD-10-CM | POA: Insufficient documentation

## 2020-10-27 DIAGNOSIS — Z452 Encounter for adjustment and management of vascular access device: Secondary | ICD-10-CM | POA: Diagnosis not present

## 2020-10-27 DIAGNOSIS — Z95828 Presence of other vascular implants and grafts: Secondary | ICD-10-CM

## 2020-10-27 DIAGNOSIS — Z7189 Other specified counseling: Secondary | ICD-10-CM

## 2020-10-27 MED ORDER — SODIUM CHLORIDE 0.9% FLUSH
10.0000 mL | Freq: Once | INTRAVENOUS | Status: AC
  Administered 2020-10-27: 10 mL
  Filled 2020-10-27: qty 10

## 2020-10-27 MED ORDER — HEPARIN SOD (PORK) LOCK FLUSH 100 UNIT/ML IV SOLN
500.0000 [IU] | Freq: Once | INTRAVENOUS | Status: AC
  Administered 2020-10-27: 500 [IU]
  Filled 2020-10-27: qty 5

## 2020-10-27 NOTE — Patient Instructions (Signed)
Implanted Port Insertion, Care After This sheet gives you information about how to care for yourself after your procedure. Your health care provider may also give you more specific instructions. If you have problems or questions, contact your health care provider. What can I expect after the procedure? After the procedure, it is common to have:  Discomfort at the port insertion site.  Bruising on the skin over the port. This should improve over 3-4 days. Follow these instructions at home: Port care  After your port is placed, you will get a manufacturer's information card. The card has information about your port. Keep this card with you at all times.  Take care of the port as told by your health care provider. Ask your health care provider if you or a family member can get training for taking care of the port at home. A home health care nurse may also take care of the port.  Make sure to remember what type of port you have. Incision care  Follow instructions from your health care provider about how to take care of your port insertion site. Make sure you: ? Wash your hands with soap and water before and after you change your bandage (dressing). If soap and water are not available, use hand sanitizer. ? Change your dressing as told by your health care provider. ? Leave stitches (sutures), skin glue, or adhesive strips in place. These skin closures may need to stay in place for 2 weeks or longer. If adhesive strip edges start to loosen and curl up, you may trim the loose edges. Do not remove adhesive strips completely unless your health care provider tells you to do that.  Check your port insertion site every day for signs of infection. Check for: ? Redness, swelling, or pain. ? Fluid or blood. ? Warmth. ? Pus or a bad smell.      Activity  Return to your normal activities as told by your health care provider. Ask your health care provider what activities are safe for you.  Do not  lift anything that is heavier than 10 lb (4.5 kg), or the limit that you are told, until your health care provider says that it is safe. General instructions  Take over-the-counter and prescription medicines only as told by your health care provider.  Do not take baths, swim, or use a hot tub until your health care provider approves. Ask your health care provider if you may take showers. You may only be allowed to take sponge baths.  Do not drive for 24 hours if you were given a sedative during your procedure.  Wear a medical alert bracelet in case of an emergency. This will tell any health care providers that you have a port.  Keep all follow-up visits as told by your health care provider. This is important. Contact a health care provider if:  You cannot flush your port with saline as directed, or you cannot draw blood from the port.  You have a fever or chills.  You have redness, swelling, or pain around your port insertion site.  You have fluid or blood coming from your port insertion site.  Your port insertion site feels warm to the touch.  You have pus or a bad smell coming from the port insertion site. Get help right away if:  You have chest pain or shortness of breath.  You have bleeding from your port that you cannot control. Summary  Take care of the port as told by your   health care provider. Keep the manufacturer's information card with you at all times.  Change your dressing as told by your health care provider.  Contact a health care provider if you have a fever or chills or if you have redness, swelling, or pain around your port insertion site.  Keep all follow-up visits as told by your health care provider. This information is not intended to replace advice given to you by your health care provider. Make sure you discuss any questions you have with your health care provider. Document Revised: 03/28/2018 Document Reviewed: 03/28/2018 Elsevier Patient Education   2021 [employer]  

## 2020-12-19 ENCOUNTER — Telehealth: Payer: Self-pay | Admitting: Hematology

## 2020-12-19 NOTE — Telephone Encounter (Signed)
Rescheduled upcoming appointment due to provider's PAL. Patient's wife is aware of changes.

## 2020-12-24 ENCOUNTER — Encounter: Payer: Self-pay | Admitting: Hematology

## 2020-12-25 ENCOUNTER — Other Ambulatory Visit: Payer: Self-pay

## 2020-12-25 DIAGNOSIS — Z8572 Personal history of non-Hodgkin lymphomas: Secondary | ICD-10-CM

## 2020-12-26 ENCOUNTER — Inpatient Hospital Stay: Payer: 59

## 2020-12-26 ENCOUNTER — Other Ambulatory Visit: Payer: Self-pay

## 2020-12-26 ENCOUNTER — Inpatient Hospital Stay: Payer: 59 | Admitting: Hematology

## 2020-12-26 ENCOUNTER — Inpatient Hospital Stay: Payer: 59 | Attending: Hematology

## 2020-12-26 DIAGNOSIS — C8378 Burkitt lymphoma, lymph nodes of multiple sites: Secondary | ICD-10-CM | POA: Insufficient documentation

## 2020-12-26 DIAGNOSIS — Z8572 Personal history of non-Hodgkin lymphomas: Secondary | ICD-10-CM

## 2020-12-26 LAB — CBC WITH DIFFERENTIAL (CANCER CENTER ONLY)
Abs Immature Granulocytes: 0.02 10*3/uL (ref 0.00–0.07)
Basophils Absolute: 0 10*3/uL (ref 0.0–0.1)
Basophils Relative: 1 %
Eosinophils Absolute: 0.1 10*3/uL (ref 0.0–0.5)
Eosinophils Relative: 1 %
HCT: 39.7 % (ref 39.0–52.0)
Hemoglobin: 13 g/dL (ref 13.0–17.0)
Immature Granulocytes: 0 %
Lymphocytes Relative: 29 %
Lymphs Abs: 2.1 10*3/uL (ref 0.7–4.0)
MCH: 33.2 pg (ref 26.0–34.0)
MCHC: 32.7 g/dL (ref 30.0–36.0)
MCV: 101.5 fL — ABNORMAL HIGH (ref 80.0–100.0)
Monocytes Absolute: 0.7 10*3/uL (ref 0.1–1.0)
Monocytes Relative: 9 %
Neutro Abs: 4.4 10*3/uL (ref 1.7–7.7)
Neutrophils Relative %: 60 %
Platelet Count: 255 10*3/uL (ref 150–400)
RBC: 3.91 MIL/uL — ABNORMAL LOW (ref 4.22–5.81)
RDW: 14.2 % (ref 11.5–15.5)
WBC Count: 7.2 10*3/uL (ref 4.0–10.5)
nRBC: 0 % (ref 0.0–0.2)

## 2020-12-26 LAB — CMP (CANCER CENTER ONLY)
ALT: 55 U/L — ABNORMAL HIGH (ref 0–44)
AST: 31 U/L (ref 15–41)
Albumin: 3.6 g/dL (ref 3.5–5.0)
Alkaline Phosphatase: 82 U/L (ref 38–126)
Anion gap: 10 (ref 5–15)
BUN: 14 mg/dL (ref 8–23)
CO2: 25 mmol/L (ref 22–32)
Calcium: 9.8 mg/dL (ref 8.9–10.3)
Chloride: 103 mmol/L (ref 98–111)
Creatinine: 1.04 mg/dL (ref 0.61–1.24)
GFR, Estimated: >60 mL/min (ref >60)
Glucose, Bld: 150 mg/dL — ABNORMAL HIGH (ref 70–99)
Potassium: 4.3 mmol/L (ref 3.5–5.1)
Sodium: 138 mmol/L (ref 135–145)
Total Bilirubin: 0.3 mg/dL (ref 0.3–1.2)
Total Protein: 8.2 g/dL — ABNORMAL HIGH (ref 6.5–8.1)

## 2021-01-10 NOTE — Progress Notes (Signed)
HEMATOLOGY/ONCOLOGY CLINIC NOTE  Date of Service: 01/12/21    Patient Care Team: [name], MD as PCP - General (Family Medicine)  CHIEF COMPLAINTS/PURPOSE OF CONSULTATION:  F/u for continued Mx of Burkitts lymphoma  HISTORY OF PRESENTING ILLNESS:   Mark Frey is a wonderful 65 y.o. male who has been referred to [place] by my colleague Dr. 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 He is accompanied today by his wife. The pt reports that he is doing well overall.   The pt started R-EPOCH every 3 weeks with neulasta support on 03/17/18. He presented to the ED with right leg swelling, found to be a DVT and bilateral PE, which resulted in the incidental finding of a lower abdomen tumor measuring 18.7 x 18.5 x 17.8 cm. He denies any abdominal pains or changes in his bowel habits prior to this finding. He first noted blood in the stools 4-5 days prior to appearing to the ED.  The pt reports that he has been taking 175m Lovenox injections once each day, except for the days in which he had blood in the stools. He hasn't had blood in his stools for the last 7 days.   He has had GI bleed related to bowel involvement with his lymphoma - this is now resolving and his hgb is stabilizing.  He notes that his scrotum and leg swelling has decreased. He denies having CP or SOB at any point from his PE.   Most recent lab results (04/06/18) of CBC w/diff, CMP, Reticulocytes  is as follows: all values are WNL except for WBC at 15.3k, RBC at 3.65, HGB at 10.5, HCT at 31.4, RDW at 17.8, PLT at 640k, ANC at 12.7k, Monocytes abs at 1.6k, Glucose at 162, Total Protein at 6.2, Albumin at 3.1, Total bilirubin at <0.2, Retic ct pct at 2.5%. Uric acid 04/06/18 was low at 3.0 LDH 04/06/18 elevated at 251  On review of systems, pt reports remaining right leg swelling, blood in the stools 7 days ago, two  bowel movements each day, left leg swelling, improved scrotal swelling, eating well, and denies problems with his port, abdominal pains, constipation, diarrhea, jaw pain, pain along the spine, problems passing urine, and any other symptoms.  INTERVAL HISTORY:   Mark Weekeswas seen in f/u of his Burkitt's lymphoma. We are joined today by Mark Frey his wife. The patient's last visit with [place] on 07/28/2020. The pt reports that he is doing well overall.  The pt reports no new concerns or symptoms over the last six months. He notes no major changes in how he has felt. The pt notes he has not needed any treatment for his blood sugars. The pt notes that he has received his COVID vaccines and first booster. The pt notes he received his first booster in November 2021.  Lab results today 12/26/2020 of CBC w/diff and CMP is as follows: all values are WNL except for RBC of 3.91, MCV of 101.5, Glucose of 150, Total Protein of 8.2, ALT of 55.  On review of systems, pt denies sudden weight loss, fevers, chills, night sweats, decreased appetite, difficulty sleeping, infection issues, changes in bowel habits, tingling/numbness in hands/feet, leg swelling, and any other symptoms.  MEDICAL HISTORY:  Past Medical History:  Diagnosis Date  . ALLERGIC RHINITIS   . Cancer ([place]    Lymphoma   . Diabetes  mellitus   . History of deep venous thrombosis (DVT) of distal vein of right lower extremity    Also bilateral PE.  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 so was considered provoked  . Hyperlipidemia     SURGICAL HISTORY: Past Surgical History:  Procedure Laterality Date  . BIOPSY  03/15/2018   Procedure: BIOPSY;  Surgeon: Milus Banister, MD;  Location: [name];  Service: Endoscopy;;  . BIOPSY  03/01/2019   Procedure: BIOPSY;  Surgeon: Ladene Artist, MD;  Location: [facility];  Service: Endoscopy;;  . BOWEL RESECTION N/A 08/25/2018   Procedure: LAPROSCOPIC LOOP  ILEOSTOMY;  Surgeon: Greer Pickerel, MD;  Location: [facility];  Service: General;  Laterality: N/A;  . CLEFT PALATE REPAIR    . COLECTOMY  07/30/2019  . COLONOSCOPY N/A 03/15/2018   Procedure: COLONOSCOPY;  Surgeon: Milus Banister, MD;  Location: [facility];  Service: Endoscopy;  Laterality: N/A;  . COLONOSCOPY N/A 08/20/2018   Procedure: COLONOSCOPY;  Surgeon: Irene Shipper, MD;  Location: [facility];  Service: Endoscopy;  Laterality: N/A;  . deviated septum repair     slight improvement  . ESOPHAGOGASTRODUODENOSCOPY N/A 03/15/2018   Procedure: ESOPHAGOGASTRODUODENOSCOPY (EGD);  Surgeon: Milus Banister, MD;  Location: [name];  Service: Endoscopy;  Laterality: N/A;  . FLEXIBLE SIGMOIDOSCOPY N/A 03/01/2019   Procedure: FLEXIBLE SIGMOIDOSCOPY;  Surgeon: Ladene Artist, MD;  Location: [facility];  Service: Endoscopy;  Laterality: N/A;  . ILEOSCOPY N/A 03/01/2019   Procedure: ILEOSCOPY THROUGH STOMA;  Surgeon: Ladene Artist, MD;  Location: [facility];  Service: Endoscopy;  Laterality: N/A;  . IR IMAGING GUIDED PORT INSERTION  03/17/2018  . LAPAROSCOPY N/A 08/25/2018   Procedure: LAPAROSCOPY DIAGNOSTIC;  Surgeon: Greer Pickerel, MD;  Location: [facility];  Service: General;  Laterality: N/A;  . TONSILLECTOMY      SOCIAL HISTORY: Social History   Socioeconomic History  . Marital status: Married    Spouse name: lisa  . Number of children: 0  . Years of education: Not on file  . Highest education level: Not on file  Occupational History  . Not on file  Tobacco Use  . Smoking status: Never Smoker  . Smokeless tobacco: Never Used  Vaping Use  . Vaping Use: Never used  Substance and Sexual Activity  . Alcohol use: Yes    Comment: occasional  . Drug use: No  . Sexual activity: Yes  Other Topics Concern  . Not on file  Social History Narrative   Married 1985. No kids. 4 small dogs.       Works in Financial trader, residential      Hobbies: work on cars, Haematologist, exercise  as able   Social Determinants of Radio broadcast assistant Strain: Not on [employer] Insecurity: Not on file  Transportation Needs: Not on file  Physical Activity: Not on file  Stress: Not on file  Social Connections: Not on file  Intimate Partner Violence: Not on file    FAMILY HISTORY: Family History  Problem Relation Age of Onset  . Lung cancer Mother        smoker  . Brain cancer Mother        metastasis  . AAA (abdominal aortic aneurysm) Father        smoker    ALLERGIES:  is allergic to ciprofloxacin.  MEDICATIONS:  Current Outpatient Medications  Medication Sig Dispense Refill  . acetaminophen (TYLENOL) 325 MG tablet Take  2 tablets (650 mg total) by mouth every 6 (six) hours as needed for mild pain (or temp > 100).    . B Complex Vitamins (VITAMIN B COMPLEX PO) Take 1 tablet by mouth daily.     . citalopram (CELEXA) 10 MG tablet Take 1 tablet (10 mg total) by mouth daily. 90 tablet 3  . loperamide (IMODIUM) 2 MG capsule Take 2 mg by mouth as needed for diarrhea or loose stools.    [name] MAGNESIUM CARBONATE PO Take 800 mg by mouth daily.    . blood glucose meter kit and supplies Dispense based on patient and insurance preference. Use daily as directed. (E11.9). (Patient not taking: Reported on 01/12/2021) 1 each 0  . glucose blood (FREESTYLE TEST STRIPS) test strip Use to check blood sugar daily (Patient not taking: Reported on 01/12/2021) 100 each 4   No current facility-administered medications for this visit.    REVIEW OF SYSTEMS:   10 Point review of Systems was done is negative except as noted above.  PHYSICAL EXAMINATION: ECOG FS:2 - Symptomatic, <50% confined to bed  Vitals:   01/12/21 1519  BP: 140/86  Pulse: 85  Resp: 17  Temp: 97.9 F (36.6 C)  SpO2: 98%   Wt Readings from Last 3 Encounters:  01/12/21 248 lb 4.8 oz (112.6 kg)  07/28/20 243 lb 14.4 oz (110.6 kg)  06/23/20 234 lb 3.2 oz (106.2 kg)   Body mass index is 36.67 kg/m.     GENERAL:alert, in no acute distress and comfortable SKIN: no acute rashes, no significant lesions EYES: conjunctiva are pink and non-injected, sclera anicteric OROPHARYNX: MMM, no exudates, no oropharyngeal erythema or ulceration NECK: supple, no JVD LYMPH:  no palpable lymphadenopathy in the cervical, axillary or inguinal regions LUNGS: clear to auscultation b/l with normal respiratory effort HEART: regular rate & rhythm ABDOMEN:  normoactive bowel sounds , non tender, not distended. Extremity: no pedal edema PSYCH: alert & oriented x 3 with fluent speech NEURO: no focal motor/sensory deficits  LABORATORY DATA:   CBC Latest Ref Rng & Units 12/26/2020 07/28/2020 06/23/2020  WBC 4.0 - 10.5 K/uL 7.2 5.4 4.5  Hemoglobin 13.0 - 17.0 g/dL 13.0 12.5(L) 12.8(L)  Hematocrit 39.0 - 52.0 % 39.7 37.5(L) 38.5  Platelets 150 - 400 K/uL 255 200 238     CMP Latest Ref Rng & Units 12/26/2020 07/28/2020 06/23/2020  Glucose 70 - 99 mg/dL 150(H) 96 85  BUN 8 - 23 mg/dL _0 Creatinine 0.61 - 1.24 mg/dL 1.04 1.05 1.08  Sodium 135 - 145 mmol/L 138 137 141  Potassium 3.5 - 5.1 mmol/L 4.3 4.2 5.0  Chloride 98 - 111 mmol/L 103 106 106  CO2 22 - 32 mmol/L _1 Calcium 8.9 - 10.3 mg/dL 9.8 9.5 10.2  Total Protein 6.5 - 8.1 g/dL 8.2(H) 7.5 7.3  Total Bilirubin 0.3 - 1.2 mg/dL 0.3 0.4 0.3  Alkaline Phos 38 - 126 U/L 82 69 -  AST 15 - 41 U/L 31 31 42(H)  ALT 0 - 44 U/L 55(H) 55(H) 69(H)   Lab Results  Component Value Date   LDH 221 (H) 07/28/2020    03/01/19 Colonoscopy biopsies:     03/17/18 Cytogenetics:   03/17/18 Colon Bx:   03/13/18 Peritoneum Biopsy:     07/02/2019 PORTABLE CHEST 1 VIEW (Accession [id number])    07/02/2019 CT ABDOMEN AND PELVIS WITH CONTRAST (Accession [id number])  RADIOGRAPHIC STUDIES:  I have personally reviewed the radiological images as listed and  agreed with the findings in the report. No results found.  ASSESSMENT & PLAN:   65 y.o. male  with  1. 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 03/10/18 CT A/P revealed Large irregular infiltrative solid mass in the right lower quadrant measuring up to 18.7 x 18.5 x 17.8 cm, infiltrating and encasing multiple distal small bowel loops and likely the ileocecal region, partially encasing the sigmoid colon, with prominent extension into the right lower retroperitoneum and extraperitoneal right pelvis with encasement of right external iliac and proximal right common iliac vasculature and infiltration of the right iliopsoas muscle. No BM or CNS involvement.   04/18/18 PET/CT revealed Continued improvement in right colonic wall thickening and surrounding bulky masses consistent with treated lymphoma. No hypermetabolic activity to suggest residual tumor. There is some hypermetabolic activity associated with the sigmoid colon which demonstrates mild wall thickening, surrounding inflammation and underlying diverticulosis, suggesting mild diverticulitis. Suspected treatment related changes throughout the bone marrow. There is mildly increased activity within the clivus without clear corresponding finding on the CT images. Attention on follow-up recommended. Nonobstructing right renal calculi. 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   06/09/18 PET/CT revealedSoft tissue lesion within the ventral pelvis is again identified demonstrating mild to moderate increased uptake within SUV max of 5.61. Deauville criteria 4. Concerning for residual metabolically active tumor. 2. Similar appearance of diffusely thickened appendix within the right lower quadrant of the abdomen. Findings may reflect treatment related changes. 3. Increased radiotracer uptake throughout the bone marrow which is favored to represent treatment related changes. 4. Lad coronary artery atherosclerotic calcifications.   patient completed planned chemorx on 10/21  10/05/18 CT  A/P revealed Persistent wall thickening in the sigmoid colon although a component of this could be due to diverticulosis. There is also a suggestion of wall thickening and potential mucosal enhancement in the terminal ileum, as well as adjacent indistinct stranding in the mesenteric and omental adipose tissues roughly similar to the prior exam. Some of this may be treatment related. Inflammatory process such as Crohn's disease not excluded. 2. No current adenopathy. 3. Other imaging findings of potential clinical significance: Nonobstructive right nephrolithiasis. Aortic Atherosclerosis. Moderate prostatomegaly. Left foraminal impingement at L4-5.  S/p IMRT with 36 Gy over 20 fractions, between 11/09/18 and 12/06/18 with Dr. Isidore Moos  05/24/2019 CT CAP revealed "1. Overall little interval change from CT 05/09/2019. Persistent inflamed loop of distal sigmoid colon with submucosal edema. Potential fistulous communication versus tethering to the ascending colon and a loop of small bowel. Findings are suggestive of inflammatory bowel disease. 2. No evidence of bowel obstruction. Oral contrast enters the ileostomy bag with bowel obstruction or small bowel dilatation."  2.H/oRLE DVT and b/l PEon anticoagulation.   3. Small bowel obstruction/ileus with sigmoid thickening/stenosis .  CT scan - sigmoid obstructive lesion ? Scar tissue from resolved lymphoma vs residual lymphoma vs post inflammatory scarring from diverticulitis/limited perforation. Also has SBO from adhesions. Colonoscopy 08/21/2018 - no intramucosal lesions. Extrinsic compression of sigmoid colon. S/p diverting ileostomy on 08/25/18 with Dr. Greer Pickerel  4.  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  GI panel and C. difficile negative.  CMV IgM negative, EBV IgM negative. 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 Recurrent abscesses and sepsis. Now s/p colectomy   --surgical pathology with no residual lymphoma.  5. Short gut syndrome with high stool output.  6. H/o hypomagnesemia - on  replacement.  6. S/p  Pancytopenia   PLAN: -Discussed pt labwork, 12/26/2020; counts and chemistries very stable. -Recommended pt receive the second COVID booster shot as recently approved. Advised pt to wait 4-6 months following first booster shot before getting this. -Discussed Port. The pt wishes to keep this in at this time and is fine with Port flushes. Discussed 5-10% risk of infection and blood clots due to [facility] every year. -Advised pt that he is nearly three years removed from treatment completion (October 2019). -No lab or clinical evidence of Burkitt's Lymphoma recurrence at this time. Pt is now two years out from treatment. -Will schedule Port flushes q20month at this time. -Will see back in 6 months with labs.   FOLLOW UP: Portflush every 2 months x 6 RTC with Dr KIrene Limbowith portflush and labs in 6 months   The total time spent in the appt was 20 minutes and more than 50% was on counseling and direct patient cares.  All of the patient's questions were answered with apparent satisfaction. The patient knows to call the clinic with any problems, questions or concerns.   GSullivan LoneMD MSt. PaulAAHIVMS SPresbyterian Rust Medical CenterCJohnson Memorial Hosp & HomeHematology/Oncology Physician CAdvanced Out[facility] Surgery [facility] (Office):       [phone](Work cell):  [phone](Fax):           [phone]  I, RReinaldo Raddle am acting as scribe for Dr. GSullivan Lone MD.     .I have reviewed the above documentation for accuracy and completeness, and I agree with the above. .Brunetta GeneraMD

## 2021-01-12 ENCOUNTER — Inpatient Hospital Stay: Payer: 59 | Attending: Hematology | Admitting: Hematology

## 2021-01-12 ENCOUNTER — Other Ambulatory Visit: Payer: Self-pay

## 2021-01-12 VITALS — BP 140/86 | HR 85 | Temp 97.9°F | Resp 17 | Ht 69.0 in | Wt 248.3 lb

## 2021-01-12 DIAGNOSIS — C8378 Burkitt lymphoma, lymph nodes of multiple sites: Secondary | ICD-10-CM

## 2021-01-12 DIAGNOSIS — Z86718 Personal history of other venous thrombosis and embolism: Secondary | ICD-10-CM | POA: Insufficient documentation

## 2021-01-12 DIAGNOSIS — Z7901 Long term (current) use of anticoagulants: Secondary | ICD-10-CM | POA: Insufficient documentation

## 2021-01-14 ENCOUNTER — Telehealth: Payer: Self-pay | Admitting: Hematology

## 2021-01-14 NOTE — Telephone Encounter (Signed)
Left message with follow-up appointments per 5/2 los. Gave option to call back to reschedule if needed.

## 2021-03-13 ENCOUNTER — Other Ambulatory Visit: Payer: Self-pay

## 2021-03-13 ENCOUNTER — Encounter: Payer: Self-pay | Admitting: Hematology

## 2021-03-13 ENCOUNTER — Inpatient Hospital Stay: Payer: 59 | Attending: Hematology

## 2021-03-13 DIAGNOSIS — Z452 Encounter for adjustment and management of vascular access device: Secondary | ICD-10-CM | POA: Insufficient documentation

## 2021-03-13 DIAGNOSIS — Z8572 Personal history of non-Hodgkin lymphomas: Secondary | ICD-10-CM

## 2021-03-13 DIAGNOSIS — Z95828 Presence of other vascular implants and grafts: Secondary | ICD-10-CM

## 2021-03-13 DIAGNOSIS — Z7189 Other specified counseling: Secondary | ICD-10-CM

## 2021-03-13 DIAGNOSIS — C8378 Burkitt lymphoma, lymph nodes of multiple sites: Secondary | ICD-10-CM | POA: Diagnosis not present

## 2021-03-13 MED ORDER — SODIUM CHLORIDE 0.9% FLUSH
10.0000 mL | Freq: Once | INTRAVENOUS | Status: AC
  Administered 2021-03-13: 10 mL
  Filled 2021-03-13: qty 10

## 2021-03-13 MED ORDER — HEPARIN SOD (PORK) LOCK FLUSH 100 UNIT/ML IV SOLN
500.0000 [IU] | Freq: Once | INTRAVENOUS | Status: AC
  Administered 2021-03-13: 500 [IU]
  Filled 2021-03-13: qty 5

## 2021-03-13 NOTE — Patient Instructions (Signed)

## 2021-05-15 ENCOUNTER — Other Ambulatory Visit: Payer: Self-pay

## 2021-05-15 ENCOUNTER — Inpatient Hospital Stay: Payer: 59 | Attending: Hematology

## 2021-05-15 DIAGNOSIS — Z7189 Other specified counseling: Secondary | ICD-10-CM

## 2021-05-15 DIAGNOSIS — Z452 Encounter for adjustment and management of vascular access device: Secondary | ICD-10-CM | POA: Insufficient documentation

## 2021-05-15 DIAGNOSIS — C8378 Burkitt lymphoma, lymph nodes of multiple sites: Secondary | ICD-10-CM | POA: Insufficient documentation

## 2021-05-15 DIAGNOSIS — Z8572 Personal history of non-Hodgkin lymphomas: Secondary | ICD-10-CM

## 2021-05-15 DIAGNOSIS — Z95828 Presence of other vascular implants and grafts: Secondary | ICD-10-CM

## 2021-05-15 MED ORDER — HEPARIN SOD (PORK) LOCK FLUSH 100 UNIT/ML IV SOLN
500.0000 [IU] | Freq: Once | INTRAVENOUS | Status: AC
  Administered 2021-05-15: 500 [IU]

## 2021-05-15 MED ORDER — SODIUM CHLORIDE 0.9% FLUSH
10.0000 mL | Freq: Once | INTRAVENOUS | Status: AC
  Administered 2021-05-15: 10 mL

## 2021-07-13 ENCOUNTER — Other Ambulatory Visit: Payer: Self-pay | Admitting: Family Medicine

## 2021-07-14 ENCOUNTER — Encounter: Payer: Self-pay | Admitting: Hematology

## 2021-07-14 ENCOUNTER — Other Ambulatory Visit: Payer: Self-pay

## 2021-07-14 DIAGNOSIS — Z8572 Personal history of non-Hodgkin lymphomas: Secondary | ICD-10-CM

## 2021-07-15 ENCOUNTER — Inpatient Hospital Stay: Payer: Medicare Other | Attending: Hematology | Admitting: Hematology

## 2021-07-15 ENCOUNTER — Other Ambulatory Visit: Payer: Self-pay | Admitting: Family Medicine

## 2021-07-15 ENCOUNTER — Inpatient Hospital Stay: Payer: Medicare Other

## 2021-07-15 ENCOUNTER — Other Ambulatory Visit: Payer: Self-pay

## 2021-07-15 ENCOUNTER — Encounter: Payer: Self-pay | Admitting: Hematology

## 2021-07-15 VITALS — BP 124/76 | HR 64 | Temp 97.5°F | Resp 20 | Wt 243.5 lb

## 2021-07-15 DIAGNOSIS — N5089 Other specified disorders of the male genital organs: Secondary | ICD-10-CM | POA: Insufficient documentation

## 2021-07-15 DIAGNOSIS — Z95828 Presence of other vascular implants and grafts: Secondary | ICD-10-CM

## 2021-07-15 DIAGNOSIS — Z8249 Family history of ischemic heart disease and other diseases of the circulatory system: Secondary | ICD-10-CM | POA: Insufficient documentation

## 2021-07-15 DIAGNOSIS — M7989 Other specified soft tissue disorders: Secondary | ICD-10-CM | POA: Diagnosis not present

## 2021-07-15 DIAGNOSIS — C8378 Burkitt lymphoma, lymph nodes of multiple sites: Secondary | ICD-10-CM | POA: Diagnosis present

## 2021-07-15 DIAGNOSIS — K912 Postsurgical malabsorption, not elsewhere classified: Secondary | ICD-10-CM | POA: Insufficient documentation

## 2021-07-15 DIAGNOSIS — Z808 Family history of malignant neoplasm of other organs or systems: Secondary | ICD-10-CM | POA: Insufficient documentation

## 2021-07-15 DIAGNOSIS — Z801 Family history of malignant neoplasm of trachea, bronchus and lung: Secondary | ICD-10-CM | POA: Diagnosis not present

## 2021-07-15 DIAGNOSIS — Z8572 Personal history of non-Hodgkin lymphomas: Secondary | ICD-10-CM

## 2021-07-15 DIAGNOSIS — Z7189 Other specified counseling: Secondary | ICD-10-CM

## 2021-07-15 DIAGNOSIS — Z79899 Other long term (current) drug therapy: Secondary | ICD-10-CM | POA: Diagnosis not present

## 2021-07-15 LAB — CBC WITH DIFFERENTIAL (CANCER CENTER ONLY)
Abs Immature Granulocytes: 0.01 10*3/uL (ref 0.00–0.07)
Basophils Absolute: 0 10*3/uL (ref 0.0–0.1)
Basophils Relative: 1 %
Eosinophils Absolute: 0.1 10*3/uL (ref 0.0–0.5)
Eosinophils Relative: 1 %
HCT: 39.9 % (ref 39.0–52.0)
Hemoglobin: 13.2 g/dL (ref 13.0–17.0)
Immature Granulocytes: 0 %
Lymphocytes Relative: 41 %
Lymphs Abs: 2.2 10*3/uL (ref 0.7–4.0)
MCH: 33 pg (ref 26.0–34.0)
MCHC: 33.1 g/dL (ref 30.0–36.0)
MCV: 99.8 fL (ref 80.0–100.0)
Monocytes Absolute: 0.4 10*3/uL (ref 0.1–1.0)
Monocytes Relative: 8 %
Neutro Abs: 2.6 10*3/uL (ref 1.7–7.7)
Neutrophils Relative %: 49 %
Platelet Count: 212 10*3/uL (ref 150–400)
RBC: 4 MIL/uL — ABNORMAL LOW (ref 4.22–5.81)
RDW: 14.6 % (ref 11.5–15.5)
WBC Count: 5.3 10*3/uL (ref 4.0–10.5)
nRBC: 0 % (ref 0.0–0.2)

## 2021-07-15 LAB — CMP (CANCER CENTER ONLY)
ALT: 48 U/L — ABNORMAL HIGH (ref 0–44)
AST: 34 U/L (ref 15–41)
Albumin: 3.7 g/dL (ref 3.5–5.0)
Alkaline Phosphatase: 97 U/L (ref 38–126)
Anion gap: 8 (ref 5–15)
BUN: 16 mg/dL (ref 8–23)
CO2: 25 mmol/L (ref 22–32)
Calcium: 9.1 mg/dL (ref 8.9–10.3)
Chloride: 108 mmol/L (ref 98–111)
Creatinine: 1.01 mg/dL (ref 0.61–1.24)
GFR, Estimated: >60 mL/min (ref >60)
Glucose, Bld: 120 mg/dL — ABNORMAL HIGH (ref 70–99)
Potassium: 4 mmol/L (ref 3.5–5.1)
Sodium: 141 mmol/L (ref 135–145)
Total Bilirubin: 0.3 mg/dL (ref 0.3–1.2)
Total Protein: 7.4 g/dL (ref 6.5–8.1)

## 2021-07-15 LAB — LACTATE DEHYDROGENASE: LDH: 149 U/L (ref 98–192)

## 2021-07-15 LAB — MAGNESIUM: Magnesium: 1.7 mg/dL (ref 1.7–2.4)

## 2021-07-15 MED ORDER — SODIUM CHLORIDE 0.9% FLUSH
10.0000 mL | Freq: Once | INTRAVENOUS | Status: AC
  Administered 2021-07-15: 10 mL

## 2021-07-15 MED ORDER — HEPARIN SOD (PORK) LOCK FLUSH 100 UNIT/ML IV SOLN
500.0000 [IU] | Freq: Once | INTRAVENOUS | Status: AC
  Administered 2021-07-15: 500 [IU]

## 2021-07-17 ENCOUNTER — Telehealth: Payer: Self-pay | Admitting: Hematology

## 2021-07-17 NOTE — Telephone Encounter (Signed)
Scheduled follow-up appointment per 11/2 los. Patient's wife is aware.

## 2021-07-21 ENCOUNTER — Encounter: Payer: Self-pay | Admitting: Hematology

## 2021-07-21 NOTE — Progress Notes (Signed)
HEMATOLOGY/ONCOLOGY CLINIC NOTE  Date of Service: .07/15/2021   Patient Care Team: Mark Olp, MD as PCP - General (Family Medicine)  CHIEF COMPLAINTS/PURPOSE OF CONSULTATION:  F/u for continued Mx of Burkitts lymphoma  HISTORY OF PRESENTING ILLNESS:   Mark Frey is a wonderful 65 y.o. male who has been referred to [place] by my colleague Dr. 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 He is accompanied today by his wife. The pt reports that he is doing well overall.   The pt started R-EPOCH every 3 weeks with neulasta support on 03/17/18. He presented to the ED with right leg swelling, found to be a DVT and bilateral PE, which resulted in the incidental finding of a lower abdomen tumor measuring 18.7 x 18.5 x 17.8 cm. He denies any abdominal pains or changes in his bowel habits prior to this finding. He first noted blood in the stools 4-5 days prior to appearing to the ED.  The pt reports that he has been taking 164m Lovenox injections once each day, except for the days in which he had blood in the stools. He hasn't had blood in his stools for the last 7 days.   He has had GI bleed related to bowel involvement with his lymphoma - this is now resolving and his hgb is stabilizing.  He notes that his scrotum and leg swelling has decreased. He denies having CP or SOB at any point from his PE.   Most recent lab results (04/06/18) of CBC w/diff, CMP, Reticulocytes  is as follows: all values are WNL except for WBC at 15.3k, RBC at 3.65, HGB at 10.5, HCT at 31.4, RDW at 17.8, PLT at 640k, ANC at 12.7k, Monocytes abs at 1.6k, Glucose at 162, Total Protein at 6.2, Albumin at 3.1, Total bilirubin at <0.2, Retic ct pct at 2.5%. Uric acid 04/06/18 was low at 3.0 LDH 04/06/18 elevated at 251  On review of systems, pt reports remaining right leg swelling, blood in the stools 7 days ago,  two bowel movements each day, left leg swelling, improved scrotal swelling, eating well, and denies problems with his port, abdominal pains, constipation, diarrhea, jaw pain, pain along the spine, problems passing urine, and any other symptoms.  INTERVAL HISTORY:   Mark Nunnwas seen in f/u of his Burkitt's lymphoma. We are joined today by Mark Frey his wife. The patient[place] last [place] on about 6 [place] ago.   Patient notes he has been feeling well and has no acute new symptoms.  Good p.o. intake.  Weight has been stable. No fevers no chills no night sweats.  No new lumps or bumps noted. Bowel habits have been stable with no issues with his colostomy.  Lab results today 07/15/2021 show CBC within normal limits, CMP stable ALT borderline at 48, LDH within normal limits at 149. Magnesium normal at 1.7.  MEDICAL HISTORY:  Past Medical History:  Diagnosis Date   [place]    [place]    Lymphoma    Diabetes mellitus    History of deep venous thrombosis (DVT) of distal vein of right lower extremity    Also bilateral PE.  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 so was considered provoked   Hyperlipidemia     SURGICAL HISTORY: Past Surgical History:  Procedure Laterality Date   BIOPSY  03/15/2018  Procedure: BIOPSY;  Surgeon: Milus Banister, MD;  Location[name];  Service: Endoscopy;;   BIOPSY  03/01/2019   Procedure: BIOPSY;  Surgeon: Ladene Artist, MD;  Location: [facility];  Service: Endoscopy;;   BOWEL RESECTION N/A 08/25/2018   Procedure: LAPROSCOPIC LOOP ILEOSTOMY;  Surgeon: Greer Pickerel, MD;  Location: [name];  Service: General;  Laterality: N/A;   CLEFT PALATE REPAIR     COLECTOMY  07/30/2019   COLONOSCOPY N/A 03/15/2018   Procedure: COLONOSCOPY;  Surgeon: Milus Banister, MD;  Location: [facility];  Service: Endoscopy;  Laterality: N/A;   COLONOSCOPY N/A 08/20/2018   Procedure: COLONOSCOPY;  Surgeon: Irene Shipper, MD;  Location: [facility];  Service: Endoscopy;  Laterality: N/A;   deviated septum repair     slight improvement   ESOPHAGOGASTRODUODENOSCOPY N/A 03/15/2018   Procedure: ESOPHAGOGASTRODUODENOSCOPY (EGD);  Surgeon: Milus Banister, MD;  Location: [name];  Service: Endoscopy;  Laterality: N/A;   FLEXIBLE SIGMOIDOSCOPY N/A 03/01/2019   Procedure: FLEXIBLE SIGMOIDOSCOPY;  Surgeon: Ladene Artist, MD;  Location: [facility];  Service: Endoscopy;  Laterality: N/A;   ILEOSCOPY N/A 03/01/2019   Procedure: ILEOSCOPY THROUGH STOMA;  Surgeon: Ladene Artist, MD;  Location: [facility];  Service: Endoscopy;  Laterality: N/A;   IR IMAGING GUIDED PORT INSERTION  03/17/2018   LAPAROSCOPY N/A 08/25/2018   Procedure: LAPAROSCOPY DIAGNOSTIC;  Surgeon: Greer Pickerel, MD;  Location: [facility];  Service: General;  Laterality: N/A;   TONSILLECTOMY      SOCIAL HISTORY: Social History   Socioeconomic History   Marital status: Married    Spouse name: lisa   Number of children: 0   Years of education: Not on file   Highest education level: Not on file  Occupational History   Not on file  Tobacco Use   Smoking status: Never   Smokeless tobacco: Never  Vaping Use   Vaping Use: Never used  Substance and Sexual Activity   Alcohol use: Yes    Comment: occasional   Drug use: No   Sexual activity: Yes  Other Topics Concern   Not on file  Social History Narrative   Married 1985. No kids. 4 small dogs.       Works in Financial trader, residential      Hobbies: work on cars, Haematologist, exercise as able   Social Determinants of Radio broadcast assistant Strain: Not on [employer] Insecurity: Not on file  Transportation Needs: Not on file  Physical Activity: Not on file  Stress: Not on file  Social Connections: Not on file  Intimate Partner Violence: Not on file    FAMILY HISTORY: Family History  Problem Relation Age of Onset   Lung cancer Mother        smoker   Brain  cancer Mother        metastasis   AAA (abdominal aortic aneurysm) Father        smoker    ALLERGIES:  is allergic to ciprofloxacin.  MEDICATIONS:  Current Outpatient Medications  Medication Sig Dispense Refill   acetaminophen (TYLENOL) 325 MG tablet Take 2 tablets (650 mg total) by mouth every 6 (six) hours as needed for mild pain (or temp > 100).     B Complex Vitamins (VITAMIN B COMPLEX PO) Take 1 tablet by mouth daily.      blood glucose meter kit and supplies Dispense based on patient and insurance preference. Use daily as directed. (E11.9). (Patient not taking: Reported on  01/12/2021) 1 each 0   citalopram (CELEXA) 10 MG tablet TAKE 1 TABLET BY MOUTH EVERY DAY 90 tablet 3   glucose blood (FREESTYLE TEST STRIPS) test strip Use to check blood sugar daily (Patient not taking: Reported on 01/12/2021) 100 each 4   loperamide (IMODIUM) 2 MG capsule Take 2 mg by mouth as needed for diarrhea or loose stools.     MAGNESIUM CARBONATE PO Take 800 mg by mouth daily.     No current facility-administered medications for this visit.    REVIEW OF SYSTEMS:   .10 Point review of Systems was done is negative except as noted above.  PHYSICAL EXAMINATION: ECOG FS:2 - Symptomatic, <50% confined to bed  Vitals:   07/15/21 1428  BP: 124/76  Pulse: 64  Resp: 20  Temp: (!) 97.5 F (36.4 C)  SpO2: 99%   Wt Readings from Last 3 Encounters:  07/15/21 243 lb 8 oz (110.5 kg)  01/12/21 248 lb 4.8 oz (112.6 kg)  07/28/20 243 lb 14.4 oz (110.6 kg)   Body mass index is 35.96 kg/m.   [name] GENERAL:alert, in no acute distress and comfortable SKIN: no acute rashes, no significant lesions EYES: conjunctiva are pink and non-injected, sclera anicteric OROPHARYNX: MMM, no exudates, no oropharyngeal erythema or ulceration NECK: supple, no JVD LYMPH:  no palpable lymphadenopathy in the cervical, axillary or inguinal regions LUNGS: clear to auscultation b/l with normal respiratory effort HEART: regular rate &  rhythm ABDOMEN:  normoactive bowel sounds , non tender, not distended., colostomy in situ. Extremity: no pedal edema PSYCH: alert & oriented x 3 with fluent speech NEURO: no focal motor/sensory deficits   LABORATORY DATA:   CBC Latest Ref Rng & Units 07/15/2021 12/26/2020 07/28/2020  WBC 4.0 - 10.5 K/uL 5.3 7.2 5.4  Hemoglobin 13.0 - 17.0 g/dL 13.2 13.0 12.5(L)  Hematocrit 39.0 - 52.0 % 39.9 39.7 37.5(L)  Platelets 150 - 400 K/uL 212 255 200     CMP Latest Ref Rng & Units 07/15/2021 12/26/2020 07/28/2020  Glucose 70 - 99 mg/dL 120(H) 150(H) 96  BUN 8 - 23 mg/dL _0 Creatinine 0.61 - 1.24 mg/dL 1.01 1.04 1.05  Sodium 135 - 145 mmol/L 141 138 137  Potassium 3.5 - 5.1 mmol/L 4.0 4.3 4.2  Chloride 98 - 111 mmol/L 108 103 106  CO2 22 - 32 mmol/L _1 Calcium 8.9 - 10.3 mg/dL 9.1 9.8 9.5  Total Protein 6.5 - 8.1 g/dL 7.4 8.2(H) 7.5  Total Bilirubin 0.3 - 1.2 mg/dL 0.3 0.3 0.4  Alkaline Phos 38 - 126 U/L 97 82 69  AST 15 - 41 U/L 34 31 31  ALT 0 - 44 U/L 48(H) 55(H) 55(H)   Lab Results  Component Value Date   LDH 149 07/15/2021    03/01/19 Colonoscopy biopsies:     03/17/18 Cytogenetics:   03/17/18 Colon Bx:   03/13/18 Peritoneum Biopsy:     07/02/2019 PORTABLE CHEST 1 VIEW (Accession [id number])    07/02/2019 CT ABDOMEN AND PELVIS WITH CONTRAST (Accession [id number])  RADIOGRAPHIC STUDIES:  I have personally reviewed the radiological images as listed and agreed with the findings in the report. No results found.  ASSESSMENT & PLAN:   65 y.o. male with   1. 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 03/10/18 CT A/P revealed Large irregular infiltrative solid mass in the right lower quadrant measuring up to 18.7 x 18.5 x 17.8 cm, infiltrating  and encasing multiple distal small bowel loops and likely the ileocecal region, partially encasing the sigmoid colon, with prominent extension  into the right lower retroperitoneum and extraperitoneal right pelvis with encasement of right external iliac and proximal right common iliac vasculature and infiltration of the right iliopsoas muscle. No BM or CNS involvement.    04/18/18 PET/CT revealed Continued improvement in right colonic wall thickening and surrounding bulky masses consistent with treated lymphoma. No hypermetabolic activity to suggest residual tumor. There is some hypermetabolic activity associated with the sigmoid colon which demonstrates mild wall thickening, surrounding inflammation and underlying diverticulosis, suggesting mild diverticulitis. Suspected treatment related changes throughout the bone marrow. There is mildly increased activity within the clivus without clear corresponding finding on the CT images. Attention on follow-up recommended. Nonobstructing right renal calculi. 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    06/09/18 PET/CT revealed Soft tissue lesion within the ventral pelvis is again identified demonstrating mild to moderate increased uptake within SUV max of 5.61. Deauville criteria 4. Concerning for residual metabolically active tumor. 2. Similar appearance of diffusely thickened appendix within the right lower quadrant of the abdomen. Findings may reflect treatment related changes. 3. Increased radiotracer uptake throughout the bone marrow which is favored to represent treatment related changes. 4. Lad coronary artery atherosclerotic calcifications.   patient completed planned chemorx on 10/21  10/05/18 CT A/P revealed Persistent wall thickening in the sigmoid colon although a component of this could be due to diverticulosis. There is also a suggestion of wall thickening and potential mucosal enhancement in the terminal ileum, as well as adjacent indistinct stranding in the mesenteric and omental adipose tissues roughly similar to the prior exam. Some of this may be treatment related. Inflammatory process such as  Crohn's disease not excluded. 2. No current adenopathy. 3. Other imaging findings of potential clinical significance: Nonobstructive right nephrolithiasis. Aortic Atherosclerosis. Moderate prostatomegaly. Left foraminal impingement at L4-5.  S/p IMRT with 36 Gy over 20 fractions, between 11/09/18 and 12/06/18 with Dr. Isidore Moos  05/24/2019 CT CAP revealed "1. Overall little interval change from CT 05/09/2019. Persistent inflamed loop of distal sigmoid colon with submucosal edema. Potential fistulous communication versus tethering to the ascending colon and a loop of small bowel. Findings are suggestive of inflammatory bowel disease. 2. No evidence of bowel obstruction. Oral contrast enters the ileostomy bag with bowel obstruction or small bowel dilatation."   2. 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    3. Small bowel obstruction/ileus with sigmoid thickening/stenosis .  CT scan - sigmoid obstructive lesion ? Scar tissue from resolved lymphoma vs residual lymphoma vs post inflammatory scarring from diverticulitis/limited perforation. Also has SBO from adhesions. Colonoscopy 08/21/2018 - no intramucosal lesions. Extrinsic compression of sigmoid colon. S/p diverting ileostomy on 08/25/18 with Dr. Greer Pickerel  4.  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  GI panel and C. difficile negative.  CMV IgM negative, EBV IgM negative. 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 Recurrent abscesses and sepsis. Now s/p colectomy  --surgical pathology with no residual lymphoma.   5. Short gut syndrome with high stool output.  6. H/o hypomagnesemia - on replacement.  6. S/p  Pancytopenia   PLAN: -Discussed pt labwork, 07/15/2021 CBC and CMP unremarkable, LDH within normal limits at 149 -Patient is now agreeable to have the port removed but wants to do it in January 2023 -No lab or clinical evidence of Burkitt's lymphoma recurrence  at this time. -  Will see back in 6 months with labs.   FOLLOW UP: IR for Port-A-Cath removal in mid January 2023 Return to clinic with [name] with labs in 6 months   . The total time spent in the appointment was 20 minutes and more than 50% was on counseling and direct patient cares.   All of the patient's questions were answered with apparent satisfaction. The patient knows to call the clinic with any problems, questions or concerns.   Sullivan Lone MD [place] AAHIVMS [facility] Hematology/Oncology Physician [facility]

## 2021-07-30 ENCOUNTER — Encounter: Payer: Self-pay | Admitting: Hematology

## 2021-09-15 ENCOUNTER — Encounter: Payer: Self-pay | Admitting: Hematology

## 2021-09-22 ENCOUNTER — Ambulatory Visit (HOSPITAL_COMMUNITY)
Admission: RE | Admit: 2021-09-22 | Discharge: 2021-09-22 | Disposition: A | Discharge status: Home / Self Care | Payer: Medicare Other | Source: Ambulatory Visit | Attending: Hematology | Admitting: Hematology

## 2021-09-22 ENCOUNTER — Encounter (HOSPITAL_COMMUNITY): Payer: Self-pay

## 2021-09-22 ENCOUNTER — Other Ambulatory Visit: Payer: Self-pay

## 2021-09-22 DIAGNOSIS — C8378 Burkitt lymphoma, lymph nodes of multiple sites: Secondary | ICD-10-CM

## 2021-09-22 DIAGNOSIS — Z452 Encounter for adjustment and management of vascular access device: Secondary | ICD-10-CM | POA: Diagnosis present

## 2021-09-22 HISTORY — PX: IR REMOVAL TUN ACCESS W/ PORT W/O FL MOD SED: IMG2290

## 2021-09-22 MED ORDER — MIDAZOLAM HCL 2 MG/2ML IJ SOLN
INTRAMUSCULAR | Status: AC | PRN
  Administered 2021-09-22: 1 mg via INTRAVENOUS

## 2021-09-22 MED ORDER — FENTANYL CITRATE (PF) 100 MCG/2ML IJ SOLN
INTRAMUSCULAR | Status: AC | PRN
  Administered 2021-09-22: 50 ug via INTRAVENOUS

## 2021-09-22 MED ORDER — LIDOCAINE HCL (PF) 1 % IJ SOLN
INTRAMUSCULAR | Status: AC | PRN
  Administered 2021-09-22: 10 mL via INTRADERMAL

## 2021-09-22 MED ORDER — SODIUM CHLORIDE 0.9 % IV SOLN: INTRAVENOUS | Status: DC

## 2021-09-22 MED ORDER — FENTANYL CITRATE (PF) 100 MCG/2ML IJ SOLN
INTRAMUSCULAR | Status: AC
  Filled 2021-09-22: qty 2

## 2021-09-22 MED ORDER — LIDOCAINE-EPINEPHRINE 1 %-1:100000 IJ SOLN
INTRAMUSCULAR | Status: AC
  Filled 2021-09-22: qty 1

## 2021-09-22 MED ORDER — MIDAZOLAM HCL 2 MG/2ML IJ SOLN
INTRAMUSCULAR | Status: AC
  Filled 2021-09-22: qty 4

## 2021-09-22 NOTE — H&P (Signed)
Referring Physician(s): [name]  Supervising Physician: Markus Daft  Patient Status:  Mark Frey  Chief Complaint:  "I'm getting my port removed"  Subjective: Pt known to IR from peritoneal mass bx on 03/13/18, BM bx on 03/17/18, port a cath placement on 03/17/18 and LLQ perit fluid aspiration on 03/04/19. He has a hx of Burkitt's lymphoma and is currently in remission. He is no longer using port and presents today for port removal. He denies fever,HA,CP,dyspnea, cough, abd/back pain,N/V or bleeding.   Past Medical History:  Diagnosis Date   ALLERGIC RHINITIS    Cancer ([place])    Lymphoma    Diabetes mellitus    History of deep venous thrombosis (DVT) of distal vein of right lower extremity    Also bilateral PE.  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 so was considered provoked   Hyperlipidemia    Past Surgical History:  Procedure Laterality Date   BIOPSY  03/15/2018   Procedure: BIOPSY;  Surgeon: Milus Banister, MD;  [facility];  Service: Endoscopy;;   BIOPSY  03/01/2019   Procedure: BIOPSY;  Surgeon: Ladene Artist, MD;  [facility];  Service: Endoscopy;;   BOWEL RESECTION N/A 08/25/2018   Procedure: LAPROSCOPIC LOOP ILEOSTOMY;  Surgeon: Greer Pickerel, MD;  Location: [name];  Service: General;  Laterality: N/A;   CLEFT PALATE REPAIR     COLECTOMY  07/30/2019   COLONOSCOPY N/A 03/15/2018   Procedure: COLONOSCOPY;  Surgeon: Milus Banister, MD;  [facility];  Service: Endoscopy;  Laterality: N/A;   COLONOSCOPY N/A 08/20/2018   Procedure: COLONOSCOPY;  Surgeon: Irene Shipper, MD;  [facility];  Service: Endoscopy;  Laterality: N/A;   deviated septum repair     slight improvement   ESOPHAGOGASTRODUODENOSCOPY N/A 03/15/2018   Procedure: ESOPHAGOGASTRODUODENOSCOPY (EGD);  Surgeon: Milus Banister, MD;  Location: [name];  Service: Endoscopy;  Laterality: N/A;   FLEXIBLE SIGMOIDOSCOPY N/A 03/01/2019    Procedure: FLEXIBLE SIGMOIDOSCOPY;  Surgeon: Ladene Artist, MD;  [facility];  Service: Endoscopy;  Laterality: N/A;   ILEOSCOPY N/A 03/01/2019   Procedure: ILEOSCOPY THROUGH STOMA;  Surgeon: Ladene Artist, MD;  [facility];  Service: Endoscopy;  Laterality: N/A;   IR IMAGING GUIDED PORT INSERTION  03/17/2018   LAPAROSCOPY N/A 08/25/2018   Procedure: LAPAROSCOPY DIAGNOSTIC;  Surgeon: Greer Pickerel, MD;  [facility];  Service: General;  Laterality: N/A;   TONSILLECTOMY        Allergies: Ciprofloxacin  Medications: Prior to Admission medications   Medication Sig Start Date End Date Taking? Authorizing Provider  Bismuth Subgallate (DEVROM) 200 MG CAPS Take by mouth.   Yes [provider]  citalopram (CELEXA) 10 MG tablet TAKE 1 TABLET BY MOUTH EVERY DAY 07/15/21  Yes [name], MD  loperamide (IMODIUM) 2 MG capsule Take 2 mg by mouth as needed for diarrhea or loose stools.   Yes [provider]  MAGNESIUM CARBONATE PO Take 800 mg by mouth daily.   Yes [provider]  acetaminophen (TYLENOL) 325 MG tablet Take 2 tablets (650 mg total) by mouth every 6 (six) hours as needed for mild pain (or temp > 100). 12/04/19   Norm Parcel, PA-C  B Complex Vitamins (VITAMIN B COMPLEX PO) Take 1 tablet by mouth daily.     [provider]  blood glucose meter kit and supplies Dispense based on patient and insurance preference. Use daily  as directed. (E11.9). Patient not taking: Reported on 01/12/2021 03/31/18   [name], MD  glucose blood (FREESTYLE TEST STRIPS) test strip Use to check blood sugar daily Patient not taking: Reported on 01/12/2021 03/30/18   [name], MD     Vital Signs: BP 132/86    Pulse 73    Temp 98.5 F (36.9 C) (Oral)    Resp 12    SpO2 97%   Physical Exam awake/alert; chest- CTA bilat; clean rt chest wall port a cath; heart- RRR; abd- soft,+BS,NT; trace pretibial edema  Imaging: No  results found.  Labs:  CBC: Recent Labs    12/26/20 0955 07/15/21 1407  WBC 7.2 5.3  HGB 13.0 13.2  HCT 39.7 39.9  PLT 255 212    COAGS: No results for input(s): INR, APTT in the last 8760 hours.  BMP: Recent Labs    12/26/20 0955 07/15/21 1407  NA 138 141  K 4.3 4.0  CL 103 108  CO2 25 25  GLUCOSE 150* 120*  BUN 14 16  CALCIUM 9.8 9.1  CREATININE 1.04 1.01  GFRNONAA >60 >60    LIVER FUNCTION TESTS: Recent Labs    12/26/20 0955 07/15/21 1407  BILITOT 0.3 0.3  AST 31 34  ALT 55* 48*  ALKPHOS 82 97  PROT 8.2* 7.4  ALBUMIN 3.6 3.7    Assessment and Plan: Subjective: Pt known to IR from peritoneal mass bx on 03/13/18, BM bx on 03/17/18, port a cath placement on 03/17/18 and LLQ perit fluid aspiration on 03/04/19. He has a hx of Burkitt's lymphoma and is currently in remission. He is no longer using port and presents today for port removal.Details/risks of procedure, incl but not limited to, internal bleeding, infection , injury to adjacent structures d/w pt with his understanding and consent.    Electronically Signed: D. Rowe Robert, PA-C 09/22/2021, 1:01 PM   I spent a total of 20 minutes at the the patient's bedside AND on the patient's hospital floor or unit, greater than 50% of which was counseling/coordinating care for port a cath removal

## 2021-09-22 NOTE — Procedures (Signed)
Interventional Radiology Procedure:   Indications: Port is no longer needed.  Procedure: Port removal  Findings: Complete removal of right chest port  Complications: No immediate complications noted.     EBL: Minimal  Plan: Discharge to home   [place]. Anselm Pancoast, MD  Pager: [phone]

## 2021-10-03 ENCOUNTER — Other Ambulatory Visit: Payer: Self-pay

## 2021-10-03 ENCOUNTER — Encounter (HOSPITAL_BASED_OUTPATIENT_CLINIC_OR_DEPARTMENT_OTHER): Payer: Self-pay | Admitting: Emergency Medicine

## 2021-10-03 ENCOUNTER — Emergency Department (HOSPITAL_BASED_OUTPATIENT_CLINIC_OR_DEPARTMENT_OTHER): Payer: Medicare Other

## 2021-10-03 ENCOUNTER — Observation Stay (HOSPITAL_COMMUNITY): Payer: Medicare Other

## 2021-10-03 ENCOUNTER — Inpatient Hospital Stay (HOSPITAL_BASED_OUTPATIENT_CLINIC_OR_DEPARTMENT_OTHER)
Admission: EM | Admit: 2021-10-03 | Discharge: 2021-10-06 | DRG: 389 | Disposition: A | Discharge status: Home / Self Care | Payer: Medicare Other | Attending: Internal Medicine | Admitting: Internal Medicine

## 2021-10-03 DIAGNOSIS — K56609 Unspecified intestinal obstruction, unspecified as to partial versus complete obstruction: Secondary | ICD-10-CM | POA: Diagnosis not present

## 2021-10-03 DIAGNOSIS — Z932 Ileostomy status: Secondary | ICD-10-CM

## 2021-10-03 DIAGNOSIS — I10 Essential (primary) hypertension: Secondary | ICD-10-CM | POA: Diagnosis present

## 2021-10-03 DIAGNOSIS — Z8572 Personal history of non-Hodgkin lymphomas: Secondary | ICD-10-CM

## 2021-10-03 DIAGNOSIS — Z86718 Personal history of other venous thrombosis and embolism: Secondary | ICD-10-CM

## 2021-10-03 DIAGNOSIS — R1110 Vomiting, unspecified: Secondary | ICD-10-CM | POA: Diagnosis not present

## 2021-10-03 DIAGNOSIS — C8510 Unspecified B-cell lymphoma, unspecified site: Secondary | ICD-10-CM | POA: Diagnosis present

## 2021-10-03 DIAGNOSIS — E119 Type 2 diabetes mellitus without complications: Secondary | ICD-10-CM | POA: Diagnosis present

## 2021-10-03 DIAGNOSIS — E785 Hyperlipidemia, unspecified: Secondary | ICD-10-CM | POA: Diagnosis present

## 2021-10-03 DIAGNOSIS — Z881 Allergy status to other antibiotic agents status: Secondary | ICD-10-CM

## 2021-10-03 DIAGNOSIS — Z933 Colostomy status: Secondary | ICD-10-CM

## 2021-10-03 DIAGNOSIS — Z4659 Encounter for fitting and adjustment of other gastrointestinal appliance and device: Secondary | ICD-10-CM

## 2021-10-03 DIAGNOSIS — Z0189 Encounter for other specified special examinations: Secondary | ICD-10-CM

## 2021-10-03 DIAGNOSIS — Z20822 Contact with and (suspected) exposure to covid-19: Secondary | ICD-10-CM | POA: Diagnosis present

## 2021-10-03 DIAGNOSIS — Z87730 Personal history of (corrected) cleft lip and palate: Secondary | ICD-10-CM

## 2021-10-03 DIAGNOSIS — R7401 Elevation of levels of liver transaminase levels: Secondary | ICD-10-CM | POA: Diagnosis present

## 2021-10-03 DIAGNOSIS — Z79899 Other long term (current) drug therapy: Secondary | ICD-10-CM

## 2021-10-03 DIAGNOSIS — Z86711 Personal history of pulmonary embolism: Secondary | ICD-10-CM

## 2021-10-03 DIAGNOSIS — K565 Intestinal adhesions [bands], unspecified as to partial versus complete obstruction: Secondary | ICD-10-CM | POA: Diagnosis not present

## 2021-10-03 DIAGNOSIS — F325 Major depressive disorder, single episode, in full remission: Secondary | ICD-10-CM | POA: Diagnosis present

## 2021-10-03 LAB — CBC
HCT: 41.1 % (ref 39.0–52.0)
Hemoglobin: 13.9 g/dL (ref 13.0–17.0)
MCH: 33.3 pg (ref 26.0–34.0)
MCHC: 33.8 g/dL (ref 30.0–36.0)
MCV: 98.6 fL (ref 80.0–100.0)
Platelets: 208 10*3/uL (ref 150–400)
RBC: 4.17 MIL/uL — ABNORMAL LOW (ref 4.22–5.81)
RDW: 14 % (ref 11.5–15.5)
WBC: 6.9 10*3/uL (ref 4.0–10.5)
nRBC: 0 % (ref 0.0–0.2)

## 2021-10-03 LAB — RESP PANEL BY RT-PCR (FLU A&B, COVID) ARPGX2
Influenza A by PCR: NEGATIVE
Influenza B by PCR: NEGATIVE
SARS Coronavirus 2 by RT PCR: NEGATIVE

## 2021-10-03 LAB — COMPREHENSIVE METABOLIC PANEL
ALT: 57 U/L — ABNORMAL HIGH (ref 0–44)
AST: 35 U/L (ref 15–41)
Albumin: 3.9 g/dL (ref 3.5–5.0)
Alkaline Phosphatase: 63 U/L (ref 38–126)
Anion gap: 10 (ref 5–15)
BUN: 15 mg/dL (ref 8–23)
CO2: 22 mmol/L (ref 22–32)
Calcium: 9.5 mg/dL (ref 8.9–10.3)
Chloride: 104 mmol/L (ref 98–111)
Creatinine, Ser: 1 mg/dL (ref 0.61–1.24)
GFR, Estimated: >60 mL/min (ref >60)
Glucose, Bld: 157 mg/dL — ABNORMAL HIGH (ref 70–99)
Potassium: 3.9 mmol/L (ref 3.5–5.1)
Sodium: 136 mmol/L (ref 135–145)
Total Bilirubin: 0.5 mg/dL (ref 0.3–1.2)
Total Protein: 7.3 g/dL (ref 6.5–8.1)

## 2021-10-03 LAB — MAGNESIUM: Magnesium: 1.7 mg/dL (ref 1.7–2.4)

## 2021-10-03 LAB — GLUCOSE, CAPILLARY: Glucose-Capillary: 100 mg/dL — ABNORMAL HIGH (ref 70–99)

## 2021-10-03 LAB — LIPASE, BLOOD: Lipase: 10 U/L — ABNORMAL LOW (ref 11–51)

## 2021-10-03 MED ORDER — ONDANSETRON HCL 4 MG PO TABS: 4.0000 mg | ORAL_TABLET | Freq: Four times a day (QID) | ORAL | Status: DC | PRN

## 2021-10-03 MED ORDER — LIDOCAINE HCL URETHRAL/MUCOSAL 2 % EX GEL
1.0000 "application " | Freq: Once | CUTANEOUS | Status: AC
  Administered 2021-10-03: 1

## 2021-10-03 MED ORDER — ACETAMINOPHEN 160 MG/5ML PO SOLN
ORAL | Status: AC
  Filled 2021-10-03: qty 20.3

## 2021-10-03 MED ORDER — ONDANSETRON HCL 4 MG/2ML IJ SOLN: 4.0000 mg | Freq: Three times a day (TID) | INTRAMUSCULAR | Status: DC | PRN

## 2021-10-03 MED ORDER — ENOXAPARIN SODIUM 40 MG/0.4ML IJ SOSY
40.0000 mg | PREFILLED_SYRINGE | INTRAMUSCULAR | Status: DC
  Administered 2021-10-03 – 2021-10-05 (×3): 40 mg via SUBCUTANEOUS
  Filled 2021-10-03 (×3): qty 0.4

## 2021-10-03 MED ORDER — INSULIN ASPART 100 UNIT/ML IJ SOLN
0.0000 [IU] | Freq: Three times a day (TID) | INTRAMUSCULAR | Status: DC
  Administered 2021-10-04 – 2021-10-06 (×5): 1 [IU] via SUBCUTANEOUS

## 2021-10-03 MED ORDER — OXYCODONE HCL 5 MG PO TABS: 5.0000 mg | ORAL_TABLET | ORAL | Status: DC | PRN

## 2021-10-03 MED ORDER — ACETAMINOPHEN 650 MG RE SUPP: 650.0000 mg | Freq: Four times a day (QID) | RECTAL | Status: DC | PRN

## 2021-10-03 MED ORDER — LACTATED RINGERS IV BOLUS
1000.0000 mL | Freq: Once | INTRAVENOUS | Status: AC
  Administered 2021-10-03: 1000 mL via INTRAVENOUS

## 2021-10-03 MED ORDER — MORPHINE SULFATE (PF) 2 MG/ML IV SOLN: 1.0000 mg | INTRAVENOUS | Status: DC | PRN

## 2021-10-03 MED ORDER — ACETAMINOPHEN 325 MG PO TABS: 650.0000 mg | ORAL_TABLET | Freq: Four times a day (QID) | ORAL | Status: DC | PRN

## 2021-10-03 MED ORDER — LACTATED RINGERS IV SOLN: INTRAVENOUS | Status: DC

## 2021-10-03 MED ORDER — IOHEXOL 300 MG/ML  SOLN
100.0000 mL | Freq: Once | INTRAMUSCULAR | Status: AC | PRN
  Administered 2021-10-03: 100 mL via INTRAVENOUS

## 2021-10-03 MED ORDER — ONDANSETRON HCL 4 MG/2ML IJ SOLN: 4.0000 mg | Freq: Four times a day (QID) | INTRAMUSCULAR | Status: DC | PRN

## 2021-10-03 MED ORDER — DIATRIZOATE MEGLUMINE & SODIUM 66-10 % PO SOLN
90.0000 mL | Freq: Once | ORAL | Status: AC
  Administered 2021-10-03: 90 mL via NASOGASTRIC
  Filled 2021-10-03: qty 90

## 2021-10-03 NOTE — ED Notes (Signed)
Patient transported to CT 

## 2021-10-03 NOTE — Assessment & Plan Note (Signed)
At baseline, continue to monitor

## 2021-10-03 NOTE — Assessment & Plan Note (Signed)
Hx of low magnesium.  Check level, continue replacement and follow

## 2021-10-03 NOTE — Assessment & Plan Note (Signed)
Continue celexa when tolerating PO

## 2021-10-03 NOTE — Assessment & Plan Note (Signed)
Controlled off medication Monitor

## 2021-10-03 NOTE — Assessment & Plan Note (Signed)
Ostomy  Nurse consulted

## 2021-10-03 NOTE — Assessment & Plan Note (Addendum)
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 Likely secondary to adhesions  -Admit to MedSurg -no clinical findings for emergent surgery at this time  -ice chips okay with surgery  -Gentle IV fluid hydration -Pain medication only as needed -encourage ambulation -Monitor electrolytes -General surgery consulted and following. Small bowel protocol to start this evening

## 2021-10-03 NOTE — Progress Notes (Signed)
Notified by EDP of need for admission d/t SBO. [facility] accepts patient to tele bed at [facility]. EDP is to remain responsible for orders/medical decisions while patient is holding at [facility]. Upon arrival to [facility], [facility] will assume care. Nursing staff will call patient placement to notify them of patient's arrival so that the proper [facility] member may receive the patient. Nursing staff will notify the following consultants, General surgery, of patient's arrival for their evaluation. Thank you.

## 2021-10-03 NOTE — ED Provider Notes (Signed)
[facility] EMERGENCY DEPT Provider Note   CSN: [id number] Arrival date & time: 10/03/21  4098     History  Chief Complaint  Patient presents with   Abdominal Pain    Mark Frey is a 66 y.o. male.   Abdominal Pain Associated symptoms: vomiting    66 year old male with a history of lymphoma with abdominal radiation, multiple bouts of diverticulitis multiple Lee requiring total abdominal colectomy, small bowel resection and end ileostomy in 07/2019 for severe recurrent diverticulitis, postoperative course complicated by multiple intra-abdominal abscesses requiring drainage with a superficial small abdominal wall abscess and purulent drainage from the wound concerning for enterocutaneous fistula, now with permanent enterocutaneous fistula, follows with GI surgery at [facility] who presents to the emergency department with vomiting and decreased ostomy output.  The patient has had some decreased ostomy output over the past 24 hours but also has had some decreased oral intake.  When he started vomiting x2, NBNB this morning he decided to present to the emergency department for evaluation.  He denies any abdominal pain at this time.  He denies any nausea at this time.  He denies any chest pain or shortness of breath.  He denies any dark color or tarry nature to his ostomy output.  He denies any fevers or chills.  History was obtained by chart review of his general surgery clinic notes from [facility] in addition to bedside interview with his wife.  Home Medications Prior to Admission medications   Medication Sig Start Date End Date Taking? Authorizing Provider  acetaminophen (TYLENOL) 325 MG tablet Take 2 tablets (650 mg total) by mouth every 6 (six) hours as needed for mild pain (or temp > 100). 12/04/19   Norm Parcel, PA-C  B Complex Vitamins (VITAMIN B COMPLEX PO) Take 1 tablet by mouth daily.     [provider]  Bismuth Subgallate (DEVROM) 200 MG CAPS Take by mouth.     [provider]  blood glucose meter kit and supplies Dispense based on patient and insurance preference. Use daily as directed. (E11.9). Patient not taking: Reported on 01/12/2021 03/31/18   [name], MD  citalopram (CELEXA) 10 MG tablet TAKE 1 TABLET BY MOUTH EVERY DAY 07/15/21   [name], MD  glucose blood (FREESTYLE TEST STRIPS) test strip Use to check blood sugar daily Patient not taking: Reported on 01/12/2021 03/30/18   [name], MD  loperamide (IMODIUM) 2 MG capsule Take 2 mg by mouth as needed for diarrhea or loose stools.    [provider]  Magnesium Oxide -Mg Supplement 400 MG CAPS Take by mouth. Supplement contains oxide, citrate and aspirate (Pt unsure if capsule or tablet)    [provider]      Allergies    Ciprofloxacin    Review of Systems   Review of Systems  Gastrointestinal:  Positive for vomiting.  All other systems reviewed and are negative.  Physical Exam Updated Vital Signs BP 127/75    Pulse 73    Temp 98.2 F (36.8 C) (Oral)    Resp 20    SpO2 100%  Physical Exam Vitals and nursing note reviewed.  Constitutional:      General: He is not in acute distress. HENT:     Head: Normocephalic and atraumatic.     Mouth/Throat:     Mouth: Mucous membranes are dry.  Eyes:     Conjunctiva/sclera: Conjunctivae normal.     Pupils: Pupils are equal, round, and reactive  to light.  Cardiovascular:     Rate and Rhythm: Normal rate and regular rhythm.  Pulmonary:     Effort: Pulmonary effort is normal. No respiratory distress.     Breath sounds: Normal breath sounds.  Abdominal:     General: There is no distension.     Tenderness: There is no abdominal tenderness. There is no guarding or rebound.     Comments: Colostomy in place with small amount of stool in the bag, no hematochezia or melena noted.  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     Cervical back: Neck supple.  Skin:    Findings: No lesion or rash.  Neurological:     General: No focal deficit present.     Mental Status: He is alert. Mental status is at baseline.    ED Results / Procedures / Treatments   Labs (all labs ordered are listed, but only abnormal results are displayed) Labs Reviewed  LIPASE, BLOOD - Abnormal; Notable for the following components:      Result Value   Lipase 10 (*)    All other components within normal limits  COMPREHENSIVE METABOLIC PANEL - Abnormal; Notable for the following components:   Glucose, Bld 157 (*)    ALT 57 (*)    All other components within normal limits  CBC - Abnormal; Notable for the following components:   RBC 4.17 (*)    All other components within normal limits    EKG None  Radiology CT ABDOMEN PELVIS W CONTRAST  Result Date: 10/03/2021 CLINICAL DATA:  Abdominal pain and vomiting. Status post colectomy with colostomy. EXAM: CT ABDOMEN AND PELVIS WITH CONTRAST TECHNIQUE: Multidetector CT imaging of the abdomen and pelvis was performed using the standard protocol following bolus administration of intravenous contrast. RADIATION DOSE REDUCTION: This exam was performed according to the departmental dose-optimization program which includes automated exposure control, adjustment of the mA and/or kV according to patient size and/or use of iterative reconstruction technique. CONTRAST:  122m OMNIPAQUE IOHEXOL 300 MG/ML  SOLN COMPARISON:  01/07/2020 FINDINGS: Lower chest: Scar versus platelike atelectasis noted in the lung bases. Hepatobiliary: No suspicious focal liver abnormality. Gallbladder appears decompressed. No bile duct dilatation. Pancreas: Unremarkable. No pancreatic ductal dilatation or surrounding inflammatory changes. Spleen: Normal in size without focal abnormality. Adrenals/Urinary Tract: Normal adrenal glands. Several small bilateral renal  hypodensities all measure less than 1 cm and are too small to characterize. Nonobstructing stone is noted within the inferior pole of the right kidney measuring 7 mm. No hydronephrosis identified bilaterally. There are extensive postsurgical changes within the lower abdomen and pelvis with increased soft tissue between the dome of bladder, ventral abdominal wall and multiple small bowel loops. The small bowel loops converge towards this area of postsurgical change and I spent suspect there is some degree of underlying adhesive process. Stomach/Bowel: Stomach appears normal. The patient is status post subtotal colectomy with right abdominal ileostomy. The proximal small bowel loops are abnormally dilated with multiple air-fluid levels. Small bowel diameters measure up to 5.9 cm compatible with bowel obstruction. The transition point is at the level of the mid small bowel within the area of postsurgical change along the dome of the urinary bladder, image 65/5. The distal small bowel loops up to the level of the is ileostomy are decompressed. Vascular/Lymphatic: There is aortic atherosclerosis without  aneurysm. No abdominopelvic adenopathy identified. Reproductive: Prostate is unremarkable. Other: No free fluid or focal fluid collections identified. No signs of pneumoperitoneum. Musculoskeletal: No acute or significant osseous findings. IMPRESSION: 1. Status post subtotal colectomy with right abdominal ileostomy. 2. Findings compatible with small bowel obstruction with transition point at the level of the mid small bowel within the area of postsurgical change in the lower abdomen/pelvis just above the dome of the urinary bladder. Findings likely reflect underlying adhesive process. 3. Extensive postsurgical changes within the lower abdomen and pelvis with increased soft tissue between the dome of bladder, ventral abdominal wall and multiple small bowel loops. The small bowel loops converge towards this area of  postsurgical change and I spent suspect there is some degree of underlying adhesive process. 4. Nonobstructing right renal calculus. 5. Aortic Atherosclerosis (ICD10-I70.0). Electronically Signed   By: Kerby Moors M.D.   On: 10/03/2021 09:15    Procedures Procedures    Medications Ordered in ED Medications  lactated ringers bolus 1,000 mL (1,000 mLs Intravenous New Bag/Given 10/03/21 0807)  iohexol (OMNIPAQUE) 300 MG/ML solution 100 mL (100 mLs Intravenous Contrast Given 10/03/21 4580)    ED Course/ Medical Decision Making/ A&P                           Medical Decision Making Amount and/or Complexity of Data Reviewed Labs: ordered. Radiology: ordered.  Risk Prescription drug management. Decision regarding hospitalization.   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  The patient has had some decreased ostomy output over the past 24 hours but also has had some decreased oral intake.  When he started vomiting x2, NBNB this morning he decided to present to the emergency department for evaluation.  He denies any abdominal pain at this time.  He denies any nausea at this time.  He denies any chest pain or shortness of breath.  He denies any dark color or tarry nature to his ostomy output.  He denies any fevers or chills.  History was obtained by chart review of his general surgery clinic notes from [facility] in addition to bedside  interview with his wife.  On arrival, the patient was afebrile, hemodynamically stable, normal sinus rhythm noted on cardiac telemetry.  Patient presenting with decreased ostomy output and NBNB emesis x2 concerning for possible ileus versus small bowel obstruction.  IV access was obtained on arrival and the patient was administered an IV fluid bolus for volume resuscitation in the setting of decreased oral intake and vomiting.  Labs obtained and evaluated by myself to include a negative lipase, CBC without a leukocytosis or anemia, CMP generally unremarkable with mild hyperglycemia to 157 and mildly elevated ALT to 57 otherwise unremarkable.  CT abdomen pelvis was performed concerning for small bowel obstruction with transition point at the level of the mid small bowel within the area of postsurgical change in the lower abdomen/pelvis just above the dome of the urinary bladder, findings likely reflect underlying adhesive process.  An NG tube was placed and the patient was  started on maintenance IV fluids.  Hospitalist medicine was consulted for admission.  General surgery at [facility] was paged and stated that they would consult on the patient while admitted.    Final Clinical Impression(s) / ED Diagnoses Final diagnoses:  SBO (small bowel obstruction) ([place])    Rx / DC Orders ED Discharge Orders     None         Regan Lemming, MD 10/03/21 [id number]

## 2021-10-03 NOTE — Assessment & Plan Note (Signed)
a1c 10/21 was 5.8 Well controlled SSI and accuchecks per protocol

## 2021-10-03 NOTE — Assessment & Plan Note (Addendum)
stage IIE bulkydisease - currently in remission. Followed by [name] oncology  Received abdominal radiation with his history of lymphoma with multiple bouts of diverticulitis ultimately requiring total abdominal colectomy, small bowel resection and end ileostomy in 07/2019 for severe, recurrent diverticulitis.  Postop course complicated by multiple intra-abdominal abscesses requiring drainage. Followed by [facility] surgery

## 2021-10-03 NOTE — ED Triage Notes (Signed)
Pt presents with abdominal pain and vomiting. Pt had colon removed 2 years ago, has colostomy . Wife states when patient starts vomiting it may be blockage. Pt has poor po last 24 hours and decreased output in colostomy.

## 2021-10-03 NOTE — H&P (Addendum)
History and Physical    Mark Frey WRU:[id number] DOB: [birth year] DOA: 10/03/2021  PCP: [name], MD Consultants:  oncology: [name], surgery: Dr. Victorio Palm ([place]), GI: Dr. Arnoldo Morale.  Patient coming from:  [facility]. Lives at home with his wife.   Chief Complaint: vomiting  HPI: Mark Frey is a 66 y.o. male with medical history significant of T2DM, hx of lymphoma, hx of DVT/PE, HLD, HTN who presented to ED for vomiting and concerns for bowel obstruction as he has history of this.  He wasn't feeling great yesterday and started to vomit around 10:30pm last night. He threw up again this morning around 5Am and his wife stated this is the usual course for his SBO so brought him into the hospital. No blood in vomit, no fever/chills. He had abdominal bloating and gas yesterday, but no pain and continues to have no pain. His colostomy output was very low yesterday and thick and sticky looking. He typically has to empty his bag every 3-4 hours. They emptied it twice yesterday.   Overall he has been feeling okay. Denies any chest pain/palpitations, shortness of breath or cough, abdominal pain, dysuria or leg swelling.   Extensive history of adhesions/obstructions. Received abdominal radiation with his history of lymphoma with multiple bouts of diverticulitis ultimately requiring total abdominal colectomy, small bowel resection and end ileostomy in 07/2019 for severe, recurrent diverticulitis.  Postop course complicated by multiple intra-abdominal abscesses requiring drainage.   ED Course: vitals: afebrile, bp: 127/87, HR: 87, RR: 20, oxygen: 100% RA Pertinent labs: ALT: 57, covid/flu negative CT abdomen/pelvis: Findings compatible with small bowel obstruction with transition point at the level of the mid small bowel within the area of postsurgical change in the lower abdomen/pelvis just above the dome of the urinary bladder. Findings likely reflect underlying  adhesive process. In ED: generally surgery consulted, NG tube placed and TR[facility] was asked to admit.   Review of Systems: As per HPI; otherwise review of systems reviewed and negative.   Ambulatory Status:  Ambulates without assistance   Past Medical History:  Diagnosis Date   ALLERGIC RHINITIS    Cancer ([place])    Lymphoma    Diabetes mellitus    History of deep venous thrombosis (DVT) of distal vein of right lower extremity    Also bilateral PE.  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 so was considered provoked   Hyperlipidemia     Past Surgical History:  Procedure Laterality Date   BIOPSY  03/15/2018   Procedure: BIOPSY;  Surgeon: Milus Banister, MD;  Location: [facility];  Service: Endoscopy;;   BIOPSY  03/01/2019   Procedure: BIOPSY;  Surgeon: Ladene Artist, MD;  Location: [facility];  Service: Endoscopy;;   BOWEL RESECTION N/A 08/25/2018   Procedure: LAPROSCOPIC LOOP ILEOSTOMY;  Surgeon: Greer Pickerel, MD;  Location: [name];  Service: General;  Laterality: N/A;   CLEFT PALATE REPAIR     COLECTOMY  07/30/2019   COLONOSCOPY N/A 03/15/2018   Procedure: COLONOSCOPY;  Surgeon: Milus Banister, MD;  Location: [facility];  Service: Endoscopy;  Laterality: N/A;   COLONOSCOPY N/A 08/20/2018   Procedure: COLONOSCOPY;  Surgeon: Irene Shipper, MD;  Location: [facility];  Service: Endoscopy;  Laterality: N/A;   deviated septum repair     slight improvement   ESOPHAGOGASTRODUODENOSCOPY N/A 03/15/2018   Procedure: ESOPHAGOGASTRODUODENOSCOPY (EGD);  Surgeon: Milus Banister, MD;  Location: [name];  Service: Endoscopy;  Laterality:  N/A;   FLEXIBLE SIGMOIDOSCOPY N/A 03/01/2019   Procedure: FLEXIBLE SIGMOIDOSCOPY;  Surgeon: Ladene Artist, MD;  Location: [facility];  Service: Endoscopy;  Laterality: N/A;   ILEOSCOPY N/A 03/01/2019   Procedure: ILEOSCOPY THROUGH STOMA;  Surgeon: Ladene Artist, MD;  [facility]: [facility];  Service: Endoscopy;   Laterality: N/A;   IR IMAGING GUIDED PORT INSERTION  03/17/2018   IR REMOVAL TUN ACCESS W/ PORT W/O FL MOD SED  09/22/2021   LAPAROSCOPY N/A 08/25/2018   Procedure: LAPAROSCOPY DIAGNOSTIC;  Surgeon: Greer Pickerel, MD;  Location: [facility];  Service: General;  Laterality: N/A;   TONSILLECTOMY      Social History   Socioeconomic History   Marital status: Married    Spouse name: lisa   Number of children: 0   Years of education: Not on file   Highest education level: Not on file  Occupational History   Not on file  Tobacco Use   Smoking status: Never   Smokeless tobacco: Never  Vaping Use   Vaping Use: Never used  Substance and Sexual Activity   Alcohol use: Yes    Comment: occasional   Drug use: No   Sexual activity: Yes  Other Topics Concern   Not on file  Social History Narrative   Married 1985. No kids. 4 small dogs.       Works in Financial trader, residential      Hobbies: work on cars, Haematologist, exercise as able   Social Determinants of Radio broadcast assistant Strain: Not on [employer] Insecurity: Not on file  Transportation Needs: Not on file  Physical Activity: Not on file  Stress: Not on file  Social Connections: Not on file  Intimate Partner Violence: Not on file    Allergies  Allergen Reactions   Ciprofloxacin Other (See Comments)    Leg tingling    Family History  Problem Relation Age of Onset   Lung cancer Mother        smoker   Brain cancer Mother        metastasis   AAA (abdominal aortic aneurysm) Father        smoker    Prior to Admission medications   Medication Sig Start Date End Date Taking? Authorizing Provider  acetaminophen (TYLENOL) 325 MG tablet Take 2 tablets (650 mg total) by mouth every 6 (six) hours as needed for mild pain (or temp > 100). 12/04/19   Norm Parcel, PA-C  B Complex Vitamins (VITAMIN B COMPLEX PO) Take 1 tablet by mouth daily.     [provider]  Bismuth Subgallate (DEVROM) 200 MG CAPS Take by  mouth.    [provider]  blood glucose meter kit and supplies Dispense based on patient and insurance preference. Use daily as directed. (E11.9). Patient not taking: Reported on 01/12/2021 03/31/18   [name], MD  citalopram (CELEXA) 10 MG tablet TAKE 1 TABLET BY MOUTH EVERY DAY 07/15/21   [name], MD  glucose blood (FREESTYLE TEST STRIPS) test strip Use to check blood sugar daily Patient not taking: Reported on 01/12/2021 03/30/18   [name], MD  loperamide (IMODIUM) 2 MG capsule Take 2 mg by mouth as needed for diarrhea or loose stools.    [provider]  Magnesium Oxide -Mg Supplement 400 MG CAPS Take by mouth. Supplement contains oxide, citrate and aspirate (Pt unsure if capsule or tablet)    [provider]    Physical Exam: Vitals:  10/03/21 1200 10/03/21 1300 10/03/21 1400 10/03/21 1611  BP: 129/79 115/73 123/78 (!) 146/74  Pulse:   70 90  Resp:   18 14  Temp:    98.8 F (37.1 C)  TempSrc:    Oral  SpO2:   100% 99%  Weight:    106 kg     General:  Appears calm and comfortable and is in NAD. NG tube in place  Eyes:  PERRL, EOMI, normal lids, iris ENT:  grossly normal hearing, lips & tongue, mmm; appropriate dentition Neck:  no LAD, masses or thyromegaly; no carotid bruits Cardiovascular:  RRR, no m/r/g. No LE edema.  Respiratory:   CTA bilaterally with no wheezes/rales/rhonchi.  Normal respiratory effort. Abdomen:  soft, NT, ND, NABS. Colostomy with good output  Back:   normal alignment, no CVAT Skin:  no rash or induration seen on limited exam Musculoskeletal:  grossly normal tone BUE/BLE, good ROM, no bony abnormality Lower extremity:  No LE edema.  Limited foot exam with no ulcerations.  2+ distal pulses. Psychiatric:  grossly normal mood and affect, speech fluent and appropriate, AOx3 Neurologic:  CN 2-12 grossly intact, moves all extremities in coordinated fashion, sensation intact    Radiological Exams on  Admission: Independently reviewed - see discussion in A/P where applicable  CT ABDOMEN PELVIS W CONTRAST  Result Date: 10/03/2021 CLINICAL DATA:  Abdominal pain and vomiting. Status post colectomy with colostomy. EXAM: CT ABDOMEN AND PELVIS WITH CONTRAST TECHNIQUE: Multidetector CT imaging of the abdomen and pelvis was performed using the standard protocol following bolus administration of intravenous contrast. RADIATION DOSE REDUCTION: This exam was performed according to the departmental dose-optimization program which includes automated exposure control, adjustment of the mA and/or kV according to patient size and/or use of iterative reconstruction technique. CONTRAST:  123m OMNIPAQUE IOHEXOL 300 MG/ML  SOLN COMPARISON:  01/07/2020 FINDINGS: Lower chest: Scar versus platelike atelectasis noted in the lung bases. Hepatobiliary: No suspicious focal liver abnormality. Gallbladder appears decompressed. No bile duct dilatation. Pancreas: Unremarkable. No pancreatic ductal dilatation or surrounding inflammatory changes. Spleen: Normal in size without focal abnormality. Adrenals/Urinary Tract: Normal adrenal glands. Several small bilateral renal hypodensities all measure less than 1 cm and are too small to characterize. Nonobstructing stone is noted within the inferior pole of the right kidney measuring 7 mm. No hydronephrosis identified bilaterally. There are extensive postsurgical changes within the lower abdomen and pelvis with increased soft tissue between the dome of bladder, ventral abdominal wall and multiple small bowel loops. The small bowel loops converge towards this area of postsurgical change and I spent suspect there is some degree of underlying adhesive process. Stomach/Bowel: Stomach appears normal. The patient is status post subtotal colectomy with right abdominal ileostomy. The proximal small bowel loops are abnormally dilated with multiple air-fluid levels. Small bowel diameters measure up to  5.9 cm compatible with bowel obstruction. The transition point is at the level of the mid small bowel within the area of postsurgical change along the dome of the urinary bladder, image 65/5. The distal small bowel loops up to the level of the is ileostomy are decompressed. Vascular/Lymphatic: There is aortic atherosclerosis without aneurysm. No abdominopelvic adenopathy identified. Reproductive: Prostate is unremarkable. Other: No free fluid or focal fluid collections identified. No signs of pneumoperitoneum. Musculoskeletal: No acute or significant osseous findings. IMPRESSION: 1. Status post subtotal colectomy with right abdominal ileostomy. 2. Findings compatible with small bowel obstruction with transition point at the level of the mid small bowel within the area  of postsurgical change in the lower abdomen/pelvis just above the dome of the urinary bladder. Findings likely reflect underlying adhesive process. 3. Extensive postsurgical changes within the lower abdomen and pelvis with increased soft tissue between the dome of bladder, ventral abdominal wall and multiple small bowel loops. The small bowel loops converge towards this area of postsurgical change and I spent suspect there is some degree of underlying adhesive process. 4. Nonobstructing right renal calculus. 5. Aortic Atherosclerosis (ICD10-I70.0). Electronically Signed   By: Kerby Moors M.D.   On: 10/03/2021 09:15    EKG: NSR 75 bpm. No longer qt prolongation.    Labs on Admission: I have personally reviewed the available labs and imaging studies at the time of the admission.  Pertinent labs:   ALT: 57, covid/flu negative   Assessment/Plan * SBO (small bowel obstruction) ([place])- (present on admission) 66 year old with history of recurrent bouts of diverticulitis with permanent colostomy and hx of SBO presenting with vomiting x 2 found to have SBO. Likely secondary to adhesions  -Admit to MedSurg -no clinical findings for emergent  surgery at this time  -ice chips okay with surgery  -Gentle IV fluid hydration -Pain medication only as needed -encourage ambulation -Monitor electrolytes -General surgery consulted and following. Small bowel protocol to start this evening    Controlled diabetes mellitus type II without complication ([place]) N4B 09/62 was 5.8 Well controlled SSI and accuchecks per protocol   Elevated ALT measurement- (present on admission) At baseline, continue to monitor  Hypomagnesemia- (present on admission) Hx of low magnesium.  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 Followed by [name] oncology  Received abdominal radiation with his history of lymphoma with multiple bouts of diverticulitis ultimately requiring total abdominal colectomy, small bowel resection and end ileostomy in 07/2019 for severe, recurrent diverticulitis.  Postop course complicated by multiple intra-abdominal abscesses requiring drainage. Followed by [facility] surgery  Colostomy status [facility]) Ostomy  Nurse consulted   Essential hypertension- (present on admission) Controlled off medication Monitor     Body mass index is 34.51 kg/m.    Level of care: Telemetry DVT prophylaxis:  Lovenox  Code Status:  Full - confirmed with patient Family Communication: wife at bedside Darden Palmer  Disposition Plan:  The patient is from: home  Anticipated d/c is to: home   Requires inpatient hospitalization and is at significant risk of worsening, requires constant monitoring, IVF, NG tube and assessment and MDM with specialists.    Patient is currently: stable  Consults called: general surgery-Dr. Marlou Starks  Admission status:  inpatient    Orma Flaming MD [facility]   How to  contact the [facility] Attending or Consulting provider [place] or covering provider during after hours [place], for this patient?  Check the care team in [facility] and look for a) attending/consulting [facility] provider listed and b) the [facility] team listed Log into [email or web address] and use 's universal password to access. If you do not have the password, please contact the hospital operator. Locate the [facility] provider you are looking for under [facility] and page to a number that you can be directly reached. If you still have difficulty reaching the provider, please page the [facility] (Director on Call) for the Hospitalists listed on amion for assistance.  10/03/2021, 5:51 PM

## 2021-10-03 NOTE — Consult Note (Signed)
Reason for Consult:abd pain Referring Physician: Dr. Carolann Littler Lawrnce Frey is an 66 y.o. male.  HPI: The patient is a 66 year old white male who presents with abd pain that started yesterday. Mark Frey had an episode of vomiting last night. Mark Frey felt worse today so Mark Frey came to ER. No fever. Mark Frey has a h/o lymphoma and has had a diversion with ileostomy and chemo and radiation. His last abd surgery was done at [facility]. His ileostomy continues to put out. CT shows sbo  Past Medical History:  Diagnosis Date   ALLERGIC RHINITIS    Cancer ([place])    Lymphoma    Diabetes mellitus    History of deep venous thrombosis (DVT) of distal vein of right lower extremity    Also bilateral PE.  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 so was considered provoked   Hyperlipidemia     Past Surgical History:  Procedure Laterality Date   BIOPSY  03/15/2018   Procedure: BIOPSY;  Surgeon: Milus Banister, MD;  Location: [facility];  Service: Endoscopy;;   BIOPSY  03/01/2019   Procedure: BIOPSY;  Surgeon: Ladene Artist, MD;  Location: [facility];  Service: Endoscopy;;   BOWEL RESECTION N/A 08/25/2018   Procedure: LAPROSCOPIC LOOP ILEOSTOMY;  Surgeon: Greer Pickerel, MD;  Location: [name];  Service: General;  Laterality: N/A;   CLEFT PALATE REPAIR     COLECTOMY  07/30/2019   COLONOSCOPY N/A 03/15/2018   Procedure: COLONOSCOPY;  Surgeon: Milus Banister, MD;  Location: [facility];  Service: Endoscopy;  Laterality: N/A;   COLONOSCOPY N/A 08/20/2018   Procedure: COLONOSCOPY;  Surgeon: Irene Shipper, MD;  Location: [facility];  Service: Endoscopy;  Laterality: N/A;   deviated septum repair     slight improvement   ESOPHAGOGASTRODUODENOSCOPY N/A 03/15/2018   Procedure: ESOPHAGOGASTRODUODENOSCOPY (EGD);  Surgeon: Milus Banister, MD;  Location: [name];  Service: Endoscopy;  Laterality: N/A;   FLEXIBLE SIGMOIDOSCOPY N/A 03/01/2019   Procedure: FLEXIBLE SIGMOIDOSCOPY;  Surgeon: Ladene Artist, MD;  Location: [facility];  Service: Endoscopy;  Laterality: N/A;   ILEOSCOPY N/A 03/01/2019   Procedure: ILEOSCOPY THROUGH STOMA;  Surgeon: Ladene Artist, MD;  Location: [facility];  Service: Endoscopy;  Laterality: N/A;   IR IMAGING GUIDED PORT INSERTION  03/17/2018   IR REMOVAL TUN ACCESS W/ PORT W/O FL MOD SED  09/22/2021   LAPAROSCOPY N/A 08/25/2018   Procedure: LAPAROSCOPY DIAGNOSTIC;  Surgeon: Greer Pickerel, MD;  Location: [facility];  Service: General;  Laterality: N/A;   TONSILLECTOMY      Family History  Problem Relation Age of Onset   Lung cancer Mother        smoker   Brain cancer Mother        metastasis   AAA (abdominal aortic aneurysm) Father        smoker    Social History:  reports that Mark Frey has never smoked. Mark Frey has never used smokeless tobacco. Mark Frey reports current alcohol use. Mark Frey reports that Mark Frey does not use drugs.  Allergies:  Allergies  Allergen Reactions   Ciprofloxacin Other (See Comments)    Leg tingling    Medications: I have reviewed the patient's current medications.  Results for orders placed or performed during the hospital encounter of 10/03/21 (from the past 48 hour(s))  Lipase, blood     Status: Abnormal   Collection Time: 10/03/21  7:16 AM  Result Value Ref Range   Lipase 10 (  L) 11 - 51 U/L    Comment: Performed at [facility], [address] [place], [place] [address]  Comprehensive metabolic panel     Status: Abnormal   Collection Time: 10/03/21  7:16 AM  Result Value Ref Range   Sodium 136 135 - 145 mmol/L   Potassium 3.9 3.5 - 5.1 mmol/L   Chloride 104 98 - 111 mmol/L   CO2 22 22 - 32 mmol/L   Glucose, Bld 157 (H) 70 - 99 mg/dL    Comment: Glucose reference range applies only to samples taken after fasting for at least 8 hours.   BUN 15 8 - 23 mg/dL   Creatinine, Ser 1.00 0.61 - 1.24 mg/dL   Calcium 9.5 8.9 - 10.3 mg/dL   Total Protein 7.3 6.5 - 8.1 g/dL   Albumin 3.9 3.5 - 5.0 g/dL   AST 35 15 - 41 U/L    ALT 57 (H) 0 - 44 U/L   Alkaline Phosphatase 63 38 - 126 U/L   Total Bilirubin 0.5 0.3 - 1.2 mg/dL   GFR, Estimated >60 >60 mL/min    Comment: (NOTE) Calculated using the CKD-EPI Creatinine Equation (2021)    Anion gap 10 5 - 15    Comment: Performed at [facility], [address], [place], [place] [address]  CBC     Status: Abnormal   Collection Time: 10/03/21  7:16 AM  Result Value Ref Range   WBC 6.9 4.0 - 10.5 K/uL   RBC 4.17 (L) 4.22 - 5.81 MIL/uL   Hemoglobin 13.9 13.0 - 17.0 g/dL   HCT 41.1 39.0 - 52.0 %   MCV 98.6 80.0 - 100.0 fL   MCH 33.3 26.0 - 34.0 pg   MCHC 33.8 30.0 - 36.0 g/dL   RDW 14.0 11.5 - 15.5 %   Platelets 208 150 - 400 K/uL   nRBC 0.0 0.0 - 0.2 %    Comment: Performed at [facility], [address] [place], [place] [address]  Resp Panel by RT-PCR (Flu A&B, Covid) Nasopharyngeal Swab     Status: None   Collection Time: 10/03/21  9:36 AM   Specimen: Nasopharyngeal Swab; Nasopharyngeal(NP) swabs in vial transport medium  Result Value Ref Range   SARS Coronavirus 2 by RT PCR NEGATIVE NEGATIVE    Comment: (NOTE) SARS-CoV-2 target nucleic acids are NOT DETECTED.  The SARS-CoV-2 RNA is generally detectable in upper respiratory specimens during the acute phase of infection. The lowest concentration of SARS-CoV-2 viral copies this assay can detect is 138 copies/mL. A negative result does not preclude SARS-Cov-2 infection and should not be used as the sole basis for treatment or other patient management decisions. A negative result may occur with  improper specimen collection/handling, submission of specimen other than nasopharyngeal swab, presence of viral mutation(s) within the areas targeted by this assay, and inadequate number of viral copies(<138 copies/mL). A negative result must be combined with clinical observations, patient history, and epidemiological information. The expected result is Negative.  Fact  Sheet for Patients:  EntrepreneurPulse.com.au  Fact Sheet for Healthcare Providers:  IncredibleEmployment.be  This test is no t yet approved or cleared by the [place] FDA and  has been authorized for detection and/or diagnosis of SARS-CoV-2 by FDA under an Emergency Use Authorization (EUA). This EUA will remain  in effect (meaning this test can be used) for the duration of the COVID-19 declaration under Section 564(b)(1) of the Act, 21 U.S.C.section 360bbb-3(b)(1), unless the authorization is terminated  or revoked sooner.       Influenza A by PCR NEGATIVE NEGATIVE   Influenza B by PCR NEGATIVE NEGATIVE    Comment: (NOTE) The Xpert Xpress SARS-CoV-2/FLU/RSV plus assay is intended as an aid in the diagnosis of influenza from Nasopharyngeal swab specimens and should not be used as a sole basis for treatment. Nasal washings and aspirates are unacceptable for Xpert Xpress SARS-CoV-2/FLU/RSV testing.  Fact Sheet for Patients: EntrepreneurPulse.com.au  Fact Sheet for Healthcare Providers: IncredibleEmployment.be  This test is not yet approved or cleared by the [place] FDA and has been authorized for detection and/or diagnosis of SARS-CoV-2 by FDA under an Emergency Use Authorization (EUA). This EUA will remain in effect (meaning this test can be used) for the duration of the COVID-19 declaration under Section 564(b)(1) of the Act, 21 U.S.C. section 360bbb-3(b)(1), unless the authorization is terminated or revoked.  Performed at [facility], [address], [place], [place] [address]     CT ABDOMEN PELVIS W CONTRAST  Result Date: 10/03/2021 CLINICAL DATA:  Abdominal pain and vomiting. Status post colectomy with colostomy. EXAM: CT ABDOMEN AND PELVIS WITH CONTRAST TECHNIQUE: Multidetector CT imaging of the abdomen and pelvis was performed using the standard protocol following  bolus administration of intravenous contrast. RADIATION DOSE REDUCTION: This exam was performed according to the departmental dose-optimization program which includes automated exposure control, adjustment of the mA and/or kV according to patient size and/or use of iterative reconstruction technique. CONTRAST:  139m OMNIPAQUE IOHEXOL 300 MG/ML  SOLN COMPARISON:  01/07/2020 FINDINGS: Lower chest: Scar versus platelike atelectasis noted in the lung bases. Hepatobiliary: No suspicious focal liver abnormality. Gallbladder appears decompressed. No bile duct dilatation. Pancreas: Unremarkable. No pancreatic ductal dilatation or surrounding inflammatory changes. Spleen: Normal in size without focal abnormality. Adrenals/Urinary Tract: Normal adrenal glands. Several small bilateral renal hypodensities all measure less than 1 cm and are too small to characterize. Nonobstructing stone is noted within the inferior pole of the right kidney measuring 7 mm. No hydronephrosis identified bilaterally. There are extensive postsurgical changes within the lower abdomen and pelvis with increased soft tissue between the dome of bladder, ventral abdominal wall and multiple small bowel loops. The small bowel loops converge towards this area of postsurgical change and I spent suspect there is some degree of underlying adhesive process. Stomach/Bowel: Stomach appears normal. The patient is status post subtotal colectomy with right abdominal ileostomy. The proximal small bowel loops are abnormally dilated with multiple air-fluid levels. Small bowel diameters measure up to 5.9 cm compatible with bowel obstruction. The transition point is at the level of the mid small bowel within the area of postsurgical change along the dome of the urinary bladder, image 65/5. The distal small bowel loops up to the level of the is ileostomy are decompressed. Vascular/Lymphatic: There is aortic atherosclerosis without aneurysm. No abdominopelvic adenopathy  identified. Reproductive: Prostate is unremarkable. Other: No free fluid or focal fluid collections identified. No signs of pneumoperitoneum. Musculoskeletal: No acute or significant osseous findings. IMPRESSION: 1. Status post subtotal colectomy with right abdominal ileostomy. 2. Findings compatible with small bowel obstruction with transition point at the level of the mid small bowel within the area of postsurgical change in the lower abdomen/pelvis just above the dome of the urinary bladder. Findings likely reflect underlying adhesive process. 3. Extensive postsurgical changes within the lower abdomen and pelvis with increased soft tissue between the dome of bladder, ventral abdominal wall and multiple small bowel loops. The small bowel loops converge towards  this area of postsurgical change and I spent suspect there is some degree of underlying adhesive process. 4. Nonobstructing right renal calculus. 5. Aortic Atherosclerosis (ICD10-I70.0). Electronically Signed   By: Kerby Moors M.D.   On: 10/03/2021 09:15    Review of Systems  Constitutional: Negative.   HENT: Negative.    Eyes: Negative.   Respiratory: Negative.    Cardiovascular: Negative.   Gastrointestinal:  Positive for abdominal pain, nausea and vomiting.  Endocrine: Negative.   Genitourinary: Negative.   Musculoskeletal: Negative.   Skin: Negative.   Allergic/Immunologic: Negative.   Neurological: Negative.   Hematological: Negative.   Psychiatric/Behavioral: Negative.    Blood pressure (!) 146/74, pulse 90, temperature 98.8 F (37.1 C), temperature source Oral, resp. rate 14, weight 106 kg, SpO2 99 %. Physical Exam Constitutional:      General: Mark Frey is not in acute distress.    Appearance: Normal appearance.  HENT:     Head: Normocephalic and atraumatic.     Right Ear: External ear normal.     Left Ear: External ear normal.     Nose: Nose normal.     Mouth/Throat:     Mouth: Mucous membranes are dry.     Pharynx:  Oropharynx is clear.  Eyes:     Extraocular Movements: Extraocular movements intact.     Conjunctiva/sclera: Conjunctivae normal.     Pupils: Pupils are equal, round, and reactive to light.  Cardiovascular:     Rate and Rhythm: Normal rate and regular rhythm.     Pulses: Normal pulses.     Heart sounds: Normal heart sounds.  Pulmonary:     Effort: Pulmonary effort is normal. No respiratory distress.     Breath sounds: Normal breath sounds.  Abdominal:     General: Abdomen is flat. There is no distension.     Palpations: Abdomen is soft.     Tenderness: There is no abdominal tenderness.     Comments: Ostomy productive  Musculoskeletal:        General: No swelling or deformity. Normal range of motion.     Cervical back: Normal range of motion and neck supple. No tenderness.  Skin:    General: Skin is warm and dry.     Coloration: Skin is not jaundiced.  Neurological:     General: No focal deficit present.     Mental Status: Mark Frey is alert and oriented to person, place, and time.  Psychiatric:        Mood and Affect: Mood normal.        Behavior: Behavior normal.        Thought Content: Thought content normal.    Assessment/Plan: The patient appears to have an sbo likely secondary to adhesions from his extensive abd history. His abd is actually flat and nontender and his ostomy is productive so I am encouraged that this may resolve without surgery. I agree with ng and bowel rest. We can start the small bowel protocol this evening. We will follow closely  Autumn Messing III 10/03/2021, 5:35 PM

## 2021-10-03 NOTE — Assessment & Plan Note (Addendum)
with sigmoid thickening/stenosis . CT scan - sigmoid obstructive lesion ? Scar tissue from resolved lymphoma vs residual lymphoma vs post inflammatory scarring from diverticulitis/limited perforation. Also has SBO from adhesions. Colonoscopy 08/21/2018 - no intramucosal lesions. Extrinsic compression of sigmoid colon. S/p diverting ileostomy on 08/25/18 with Dr. Greer Pickerel -consult ostomy care

## 2021-10-04 ENCOUNTER — Observation Stay (HOSPITAL_COMMUNITY): Payer: Medicare Other

## 2021-10-04 ENCOUNTER — Inpatient Hospital Stay (HOSPITAL_COMMUNITY): Payer: Medicare Other

## 2021-10-04 DIAGNOSIS — C8510 Unspecified B-cell lymphoma, unspecified site: Secondary | ICD-10-CM | POA: Diagnosis present

## 2021-10-04 DIAGNOSIS — E785 Hyperlipidemia, unspecified: Secondary | ICD-10-CM | POA: Diagnosis present

## 2021-10-04 DIAGNOSIS — F325 Major depressive disorder, single episode, in full remission: Secondary | ICD-10-CM | POA: Diagnosis present

## 2021-10-04 DIAGNOSIS — Z79899 Other long term (current) drug therapy: Secondary | ICD-10-CM | POA: Diagnosis not present

## 2021-10-04 DIAGNOSIS — R1110 Vomiting, unspecified: Secondary | ICD-10-CM | POA: Diagnosis present

## 2021-10-04 DIAGNOSIS — E119 Type 2 diabetes mellitus without complications: Secondary | ICD-10-CM | POA: Diagnosis present

## 2021-10-04 DIAGNOSIS — Z86711 Personal history of pulmonary embolism: Secondary | ICD-10-CM | POA: Diagnosis not present

## 2021-10-04 DIAGNOSIS — K565 Intestinal adhesions [bands], unspecified as to partial versus complete obstruction: Secondary | ICD-10-CM | POA: Diagnosis present

## 2021-10-04 DIAGNOSIS — K56609 Unspecified intestinal obstruction, unspecified as to partial versus complete obstruction: Secondary | ICD-10-CM | POA: Diagnosis not present

## 2021-10-04 DIAGNOSIS — Z87730 Personal history of (corrected) cleft lip and palate: Secondary | ICD-10-CM | POA: Diagnosis not present

## 2021-10-04 DIAGNOSIS — Z20822 Contact with and (suspected) exposure to covid-19: Secondary | ICD-10-CM | POA: Diagnosis present

## 2021-10-04 DIAGNOSIS — Z86718 Personal history of other venous thrombosis and embolism: Secondary | ICD-10-CM | POA: Diagnosis not present

## 2021-10-04 DIAGNOSIS — Z932 Ileostomy status: Secondary | ICD-10-CM | POA: Diagnosis not present

## 2021-10-04 DIAGNOSIS — R7401 Elevation of levels of liver transaminase levels: Secondary | ICD-10-CM | POA: Diagnosis present

## 2021-10-04 DIAGNOSIS — Z881 Allergy status to other antibiotic agents status: Secondary | ICD-10-CM | POA: Diagnosis not present

## 2021-10-04 DIAGNOSIS — I10 Essential (primary) hypertension: Secondary | ICD-10-CM | POA: Diagnosis present

## 2021-10-04 LAB — GLUCOSE, CAPILLARY
Glucose-Capillary: 97 mg/dL (ref 70–99)
Glucose-Capillary: 120 mg/dL — ABNORMAL HIGH (ref 70–99)
Glucose-Capillary: 134 mg/dL — ABNORMAL HIGH (ref 70–99)
Glucose-Capillary: 110 mg/dL — ABNORMAL HIGH (ref 70–99)

## 2021-10-04 LAB — COMPREHENSIVE METABOLIC PANEL
ALT: 49 U/L — ABNORMAL HIGH (ref 0–44)
AST: 27 U/L (ref 15–41)
Albumin: 3.8 g/dL (ref 3.5–5.0)
Alkaline Phosphatase: 65 U/L (ref 38–126)
Anion gap: 9 (ref 5–15)
BUN: 12 mg/dL (ref 8–23)
CO2: 27 mmol/L (ref 22–32)
Calcium: 9 mg/dL (ref 8.9–10.3)
Chloride: 102 mmol/L (ref 98–111)
Creatinine, Ser: 1 mg/dL (ref 0.61–1.24)
GFR, Estimated: >60 mL/min (ref >60)
Glucose, Bld: 103 mg/dL — ABNORMAL HIGH (ref 70–99)
Potassium: 3.6 mmol/L (ref 3.5–5.1)
Sodium: 138 mmol/L (ref 135–145)
Total Bilirubin: 0.8 mg/dL (ref 0.3–1.2)
Total Protein: 7 g/dL (ref 6.5–8.1)

## 2021-10-04 LAB — HIV ANTIBODY (ROUTINE TESTING W REFLEX): HIV Screen 4th Generation wRfx: NONREACTIVE

## 2021-10-04 LAB — MAGNESIUM: Magnesium: 1.9 mg/dL (ref 1.7–2.4)

## 2021-10-04 MED ORDER — KCL IN DEXTROSE-NACL 20-5-0.9 MEQ/L-%-% IV SOLN
INTRAVENOUS | Status: DC
  Filled 2021-10-04 (×3): qty 1000

## 2021-10-04 MED ORDER — MORPHINE SULFATE (PF) 2 MG/ML IV SOLN
1.0000 mg | INTRAVENOUS | Status: DC | PRN
  Filled 2021-10-04: qty 1

## 2021-10-04 MED ORDER — TRAMADOL HCL 50 MG PO TABS: 50.0000 mg | ORAL_TABLET | Freq: Four times a day (QID) | ORAL | Status: DC | PRN

## 2021-10-04 MED ORDER — OXYCODONE HCL 5 MG PO TABS: 5.0000 mg | ORAL_TABLET | ORAL | Status: DC | PRN

## 2021-10-04 NOTE — Progress Notes (Signed)
Mark Frey  RFF:[id number] DOB: July 27, 1956 DOA: 10/03/2021 PCP: [name], MD    Brief Narrative:  [id number] with a history of lymphoma requiring abdominal radiation resulting in multiple bouts of obstruction/adhesions/infection, partial small bowel resection with end ileostomy November 2020, DVT/PE, HTN, DM2, and HLD who presented to the ED with intractable vomiting for approximately 12 hours.  In the ED vitals were stable but CT abdomen noted evidence of a small bowel obstruction with transition point at the level of the mid small bowel.  NG tube was placed.  Consultants:  General Surgery  Code Status: FULL CODE  Antimicrobials:  None  DVT prophylaxis: Lovenox  Interim Hx: Afebrile.  Vital signs stable.  CBG controlled.  Magnesium improved.  Potassium not yet at goal.  Resting comfortably in the bedside chair at the time of my visit.  Reports increasing output of gas and liquid through his ostomy.  No vomiting.  No chest pain or shortness of breath.  Assessment & Plan:  Small bowel obstruction -complicated hx of prior obstruction/adhesions/infection/surgery General Surgery following - NG in place  High-grade B-cell lymphoma Currently in remission -followed by [name]   DM2 - controlled CBG controlled at this time  HTN Blood pressure controlled at this time  Hypomagnesemia Corrected with supplementation   Family Communication: No family present at time of exam Disposition: From home -should be able to return home  Objective: Blood pressure 139/85, pulse 79, temperature 98.4 F (36.9 C), temperature source Oral, resp. rate 16, height 5' 9"  (1.753 m), weight 106 kg, SpO2 97 %.  Intake/Output Summary (Last 24 hours) at 10/04/2021 0953 Last data filed at 10/04/2021 0900 Gross per 24 hour  Intake 1849.38 ml  Output 3850 ml  Net -2000.62 ml   Filed Weights   10/03/21 1611  Weight: 106 kg    Examination: General: No acute respiratory distress Lungs:  Clear to auscultation bilaterally without wheezes or crackles Cardiovascular: Regular rate and rhythm without murmur gallop or rub normal S1 and S2 Abdomen: Nontender, nondistended, soft, bowel sounds positive Extremities: No significant cyanosis, clubbing, or edema bilateral lower extremities  CBC: Recent Labs  Lab 10/03/21 0716  WBC 6.9  HGB 13.9  HCT 41.1  MCV 98.6  PLT 177   Basic Metabolic Panel: Recent Labs  Lab 10/03/21 0716 10/03/21 1823 10/04/21 0424  NA 136  --  138  K 3.9  --  3.6  CL 104  --  102  CO2 22  --  27  GLUCOSE 157*  --  103*  BUN 15  --  12  CREATININE 1.00  --  1.00  CALCIUM 9.5  --  9.0  MG  --  1.7 1.9   GFR: Estimated Creatinine Clearance: 88.3 mL/min (by C-G formula based on SCr of 1 mg/dL).  Liver Function Tests: Recent Labs  Lab 10/03/21 0716 10/04/21 0424  AST 35 27  ALT 57* 49*  ALKPHOS 63 65  BILITOT 0.5 0.8  PROT 7.3 7.0  ALBUMIN 3.9 3.8   Recent Labs  Lab 10/03/21 0716  LIPASE 10*   HbA1C: Hgb A1c MFr Bld  Date/Time Value Ref Range Status  06/23/2020 03:41 PM 5.8 (H) <5.7 % of total Hgb Final    Comment:    For someone without known diabetes, a hemoglobin  A1c value between 5.7% and 6.4% is consistent with prediabetes and should be confirmed with a  follow-up test. . For someone with known diabetes, a value <7% indicates that their  diabetes is well controlled. A1c targets should be individualized based on duration of diabetes, age, comorbid conditions, and other considerations. . This assay result is consistent with an increased risk of diabetes. . Currently, no consensus exists regarding use of hemoglobin A1c for diagnosis of diabetes for children. [name]   02/01/2020 12:07 PM 5.1 4.6 - 6.5 % Final    Comment:    Glycemic Control Guidelines for People with Diabetes:Non Diabetic:  <6%Goal of Therapy: <7%Additional Action Suggested:  >8%     CBG: Recent Labs  Lab 10/03/21 2109 10/04/21 0745  GLUCAP 100*  97    Scheduled Meds:  enoxaparin (LOVENOX) injection  40 mg Subcutaneous Q24H   insulin aspart  0-9 Units Subcutaneous TID WC   Continuous Infusions:  lactated ringers 75 mL/hr at 10/03/21 1841     LOS: 0 days   Cherene Altes, MD [facility] Office  [phone] Pager - Text Page per Shea Evans  If 7PM-7AM, please contact night-coverage per Amion 10/04/2021, 9:53 AM

## 2021-10-04 NOTE — Progress Notes (Signed)
Subjective/Chief Complaint: No complaints. Feels good   Objective: Vital signs in last 24 hours: Temp:  [98 F (36.7 C)-99.3 F (37.4 C)] 98.4 F (36.9 C) (01/22 0353) Pulse Rate:  [70-90] 79 (01/22 0353) Resp:  [14-18] 16 (01/22 0353) BP: (115-146)/(73-85) 139/85 (01/22 0353) SpO2:  [96 %-100 %] 97 % (01/22 0353) Weight:  [106 kg] 106 kg (01/21 1611)    Intake/Output from previous day: 01/21 0701 - 01/22 0700 In: 1849.4 [I.V.:848.8; IV Piggyback:1000.6] Out: 3100 [Urine:400; Emesis/NG output:600; [id number]] Intake/Output this shift: Total I/O In: -  Out: 750 [Urine:150; Emesis/NG output:600]  General appearance: alert and cooperative Resp: clear to auscultation bilaterally Cardio: regular rate and rhythm GI: soft, nontender. Not distended. Ostomy productive  Lab Results:  Recent Labs    10/03/21 0716  WBC 6.9  HGB 13.9  HCT 41.1  PLT 208   BMET Recent Labs    10/03/21 0716 10/04/21 0424  NA 136 138  K 3.9 3.6  CL 104 102  CO2 22 27  GLUCOSE 157* 103*  BUN 15 12  CREATININE 1.00 1.00  CALCIUM 9.5 9.0   PT/INR No results for input(s): LABPROT, INR in the last 72 hours. ABG No results for input(s): PHART, HCO3 in the last 72 hours.  Invalid input(s): PCO2, PO2  Studies/Results: CT ABDOMEN PELVIS W CONTRAST  Result Date: 10/03/2021 CLINICAL DATA:  Abdominal pain and vomiting. Status post colectomy with colostomy. EXAM: CT ABDOMEN AND PELVIS WITH CONTRAST TECHNIQUE: Multidetector CT imaging of the abdomen and pelvis was performed using the standard protocol following bolus administration of intravenous contrast. RADIATION DOSE REDUCTION: This exam was performed according to the departmental dose-optimization program which includes automated exposure control, adjustment of the mA and/or kV according to patient size and/or use of iterative reconstruction technique. CONTRAST:  112m OMNIPAQUE IOHEXOL 300 MG/ML  SOLN COMPARISON:  01/07/2020 FINDINGS:  Lower chest: Scar versus platelike atelectasis noted in the lung bases. Hepatobiliary: No suspicious focal liver abnormality. Gallbladder appears decompressed. No bile duct dilatation. Pancreas: Unremarkable. No pancreatic ductal dilatation or surrounding inflammatory changes. Spleen: Normal in size without focal abnormality. Adrenals/Urinary Tract: Normal adrenal glands. Several small bilateral renal hypodensities all measure less than 1 cm and are too small to characterize. Nonobstructing stone is noted within the inferior pole of the right kidney measuring 7 mm. No hydronephrosis identified bilaterally. There are extensive postsurgical changes within the lower abdomen and pelvis with increased soft tissue between the dome of bladder, ventral abdominal wall and multiple small bowel loops. The small bowel loops converge towards this area of postsurgical change and I spent suspect there is some degree of underlying adhesive process. Stomach/Bowel: Stomach appears normal. The patient is status post subtotal colectomy with right abdominal ileostomy. The proximal small bowel loops are abnormally dilated with multiple air-fluid levels. Small bowel diameters measure up to 5.9 cm compatible with bowel obstruction. The transition point is at the level of the mid small bowel within the area of postsurgical change along the dome of the urinary bladder, image 65/5. The distal small bowel loops up to the level of the is ileostomy are decompressed. Vascular/Lymphatic: There is aortic atherosclerosis without aneurysm. No abdominopelvic adenopathy identified. Reproductive: Prostate is unremarkable. Other: No free fluid or focal fluid collections identified. No signs of pneumoperitoneum. Musculoskeletal: No acute or significant osseous findings. IMPRESSION: 1. Status post subtotal colectomy with right abdominal ileostomy. 2. Findings compatible with small bowel obstruction with transition point at the level of the mid small bowel  within the area of postsurgical change in the lower abdomen/pelvis just above the dome of the urinary bladder. Findings likely reflect underlying adhesive process. 3. Extensive postsurgical changes within the lower abdomen and pelvis with increased soft tissue between the dome of bladder, ventral abdominal wall and multiple small bowel loops. The small bowel loops converge towards this area of postsurgical change and I spent suspect there is some degree of underlying adhesive process. 4. Nonobstructing right renal calculus. 5. Aortic Atherosclerosis (ICD10-I70.0). Electronically Signed   By: Kerby Moors M.D.   On: 10/03/2021 09:15   DG Abd Portable 1V-Small Bowel Obstruction Protocol-initial, 8 hr delay  Result Date: 10/04/2021 CLINICAL DATA:  66 year old male with small bowel obstruction. 8 hour delay oral contrast x-ray. EXAM: PORTABLE ABDOMEN - 1 VIEW COMPARISON:  10/03/2021 and earlier. FINDINGS: Stable enteric tube. Lung base appears stable, negative. Multifocal flocculated oral contrast in left abdominal small bowel loops, with persistent gas-filled dilated loops measuring 4-5 cm diameter in the mid abdomen. Bowel gas pattern does not appear significantly changed from the CT yesterday. But there is some oral contrast in decompressed right abdominal small bowel loops located near the ostomy. No acute osseous abnormality identified. IMPRESSION: 1. Partial small bowel obstruction, with gas pattern not significantly changed from the CT yesterday. 2. Stable enteric tube. Electronically Signed   By: Genevie Ann M.D.   On: 10/04/2021 05:02   DG Abd Portable 1V-Small Bowel Protocol-Position Verification  Result Date: 10/03/2021 CLINICAL DATA:  NG tube EXAM: PORTABLE ABDOMEN - 1 VIEW COMPARISON:  08/20/2018, CT 10/03/2021 FINDINGS: Esophageal tube tip and side port overlie the proximal stomach. There is dilated bowel in the upper abdomen consistent with bowel obstruction. IMPRESSION: Esophageal tube tip and side  port overlie the proximal stomach Electronically Signed   By: Donavan Foil M.D.   On: 10/03/2021 18:22    Anti-infectives: Anti-infectives (From admission, onward)    None       Assessment/Plan: s/p * No surgery found * Continue ng for now. Had to advance ng and check placement Small bowel protocol Ambulate Will contact her surgeon at [facility] this week. Sounds like surgery on his abd would be high risk and if needed may need to be done at academic center.  LOS: 0 days    Mark Frey 10/04/2021

## 2021-10-05 ENCOUNTER — Inpatient Hospital Stay (HOSPITAL_COMMUNITY): Payer: Medicare Other

## 2021-10-05 LAB — BASIC METABOLIC PANEL
Anion gap: 6 (ref 5–15)
BUN: 11 mg/dL (ref 8–23)
CO2: 25 mmol/L (ref 22–32)
Calcium: 8.6 mg/dL — ABNORMAL LOW (ref 8.9–10.3)
Chloride: 105 mmol/L (ref 98–111)
Creatinine, Ser: 0.99 mg/dL (ref 0.61–1.24)
GFR, Estimated: >60 mL/min (ref >60)
Glucose, Bld: 140 mg/dL — ABNORMAL HIGH (ref 70–99)
Potassium: 3.6 mmol/L (ref 3.5–5.1)
Sodium: 136 mmol/L (ref 135–145)

## 2021-10-05 LAB — GLUCOSE, CAPILLARY
Glucose-Capillary: 140 mg/dL — ABNORMAL HIGH (ref 70–99)
Glucose-Capillary: 131 mg/dL — ABNORMAL HIGH (ref 70–99)
Glucose-Capillary: 108 mg/dL — ABNORMAL HIGH (ref 70–99)
Glucose-Capillary: 176 mg/dL — ABNORMAL HIGH (ref 70–99)

## 2021-10-05 LAB — PHOSPHORUS: Phosphorus: 2.1 mg/dL — ABNORMAL LOW (ref 2.5–4.6)

## 2021-10-05 MED ORDER — POTASSIUM PHOSPHATES 15 MMOLE/5ML IV SOLN
30.0000 mmol | Freq: Once | INTRAVENOUS | Status: AC
  Administered 2021-10-05: 30 mmol via INTRAVENOUS
  Filled 2021-10-05: qty 10

## 2021-10-05 MED ORDER — POTASSIUM CHLORIDE 10 MEQ/100ML IV SOLN
10.0000 meq | INTRAVENOUS | Status: AC
  Administered 2021-10-05 (×3): 10 meq via INTRAVENOUS
  Filled 2021-10-05 (×3): qty 100

## 2021-10-05 MED ORDER — PANTOPRAZOLE SODIUM 40 MG IV SOLR
40.0000 mg | INTRAVENOUS | Status: DC
  Administered 2021-10-05: 40 mg via INTRAVENOUS
  Filled 2021-10-05: qty 40

## 2021-10-05 MED ORDER — POTASSIUM CHLORIDE 10 MEQ/100ML IV SOLN: 10.0000 meq | INTRAVENOUS | Status: DC

## 2021-10-05 NOTE — Progress Notes (Signed)
Patient ID: Mark Frey, male   DOB: 08/20/1956, 66 y.o.   MRN: [id number] [facility] Surgery Progress Note     Subjective: CC-  No complaints. Denies abdominal pain, bloating, n/v. Ileostomy productive with stool and air. NG with 1.3L Output recorded, but patient states that he drank 2 cans gingerale yesterday and some water. Xray shows persistently dilated loops of small bowel.  Objective: Vital signs in last 24 hours: Temp:  [98.7 F (37.1 C)-99 F (37.2 C)] 99 F (37.2 C) (01/23 0410) Pulse Rate:  [80-88] 88 (01/23 0410) Resp:  [18] 18 (01/23 0410) BP: (139-140)/(83-91) 140/91 (01/23 0410) SpO2:  [94 %-96 %] 94 % (01/23 0410)    Intake/Output from previous day: 01/22 0701 - 01/23 0700 In: 1464.2 [I.V.:1464.2] Out: 1930 [Urine:550; Emesis/NG output:1300; Stool:80] Intake/Output this shift: No intake/output data recorded.  PE: Gen:  Alert, NAD, pleasant Pulm:  rate and effort normal Abd: Soft, ND, NT, ostomy productive  Lab Results:  Recent Labs    10/03/21 0716  WBC 6.9  HGB 13.9  HCT 41.1  PLT 208   BMET Recent Labs    10/04/21 0424 10/05/21 0424  NA 138 136  K 3.6 3.6  CL 102 105  CO2 27 25  GLUCOSE 103* 140*  BUN 12 11  CREATININE 1.00 0.99  CALCIUM 9.0 8.6*   PT/INR No results for input(s): LABPROT, INR in the last 72 hours. CMP     Component Value Date/Time   NA 136 10/05/2021 0424   K 3.6 10/05/2021 0424   CL 105 10/05/2021 0424   CO2 25 10/05/2021 0424   GLUCOSE 140 (H) 10/05/2021 0424   BUN 11 10/05/2021 0424   CREATININE 0.99 10/05/2021 0424   CREATININE 1.01 07/15/2021 1407   CREATININE 1.08 06/23/2020 1541   CALCIUM 8.6 (L) 10/05/2021 0424   PROT 7.0 10/04/2021 0424   ALBUMIN 3.8 10/04/2021 0424   AST 27 10/04/2021 0424   AST 34 07/15/2021 1407   ALT 49 (H) 10/04/2021 0424   ALT 48 (H) 07/15/2021 1407   ALKPHOS 65 10/04/2021 0424   BILITOT 0.8 10/04/2021 0424   BILITOT 0.3 07/15/2021 1407   GFRNONAA >60  10/05/2021 0424   GFRNONAA >60 07/15/2021 1407   GFRNONAA 73 06/23/2020 1541   GFRAA 84 06/23/2020 1541   Lipase     Component Value Date/Time   LIPASE 10 (L) 10/03/2021 0716       Studies/Results: DG Abd Portable 1V  Result Date: 10/05/2021 CLINICAL DATA:  Small bowel obstruction. EXAM: PORTABLE ABDOMEN - 1 VIEW COMPARISON:  October 04, 2021. FINDINGS: Nasogastric tube tip is seen in proximal stomach. Small bowel dilatation is noted concerning for distal small bowel obstruction. IMPRESSION: Stable small bowel dilatation concerning for obstruction. Stable position of nasogastric tube. Electronically Signed   By: Marijo Conception M.D.   On: 10/05/2021 08:40   DG Abd Portable 1V  Result Date: 10/04/2021 CLINICAL DATA:  NG tube reposition. EXAM: PORTABLE ABDOMEN - 1 VIEW COMPARISON:  Abdominal radiograph 10/04/2021. FINDINGS: Enteric tube tip and side-port project over the stomach. Left basilar atelectasis. Persistent small-bowel obstruction. Small bowel loops measure up to 5 cm. IMPRESSION: Persistent small bowel obstruction. Enteric tube projects over the stomach. Electronically Signed   By: Lovey Newcomer M.D.   On: 10/04/2021 12:52   DG Abd Portable 1V-Small Bowel Obstruction Protocol-initial, 8 hr delay  Result Date: 10/04/2021 CLINICAL DATA:  66 year old male with small bowel obstruction. 8 hour delay oral contrast x-ray.  EXAM: PORTABLE ABDOMEN - 1 VIEW COMPARISON:  10/03/2021 and earlier. FINDINGS: Stable enteric tube. Lung base appears stable, negative. Multifocal flocculated oral contrast in left abdominal small bowel loops, with persistent gas-filled dilated loops measuring 4-5 cm diameter in the mid abdomen. Bowel gas pattern does not appear significantly changed from the CT yesterday. But there is some oral contrast in decompressed right abdominal small bowel loops located near the ostomy. No acute osseous abnormality identified. IMPRESSION: 1. Partial small bowel obstruction, with gas  pattern not significantly changed from the CT yesterday. 2. Stable enteric tube. Electronically Signed   By: Genevie Ann M.D.   On: 10/04/2021 05:02   DG Abd Portable 1V-Small Bowel Protocol-Position Verification  Result Date: 10/03/2021 CLINICAL DATA:  NG tube EXAM: PORTABLE ABDOMEN - 1 VIEW COMPARISON:  08/20/2018, CT 10/03/2021 FINDINGS: Esophageal tube tip and side port overlie the proximal stomach. There is dilated bowel in the upper abdomen consistent with bowel obstruction. IMPRESSION: Esophageal tube tip and side port overlie the proximal stomach Electronically Signed   By: Donavan Foil M.D.   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 HTN DM HLD Hx DVT/PE  Plan: Patient clinically doing well and having bowel function. Will try clamping NG today and give sips of clears. If patient becomes nauseated or has worsening abdominal pain/ bloating then return NG to LIWS.  Straightforward Medical Decision Making   LOS: 1 day    Wellington Hampshire, [facility] Surgery 10/05/2021, 9:07 AM Please see Amion for pager number during day hours 7:00am-4:30pm

## 2021-10-05 NOTE — Progress Notes (Signed)
Mark Frey  GYI:[id number] DOB: 08/22/1956 DOA: 10/03/2021 PCP: [name], MD    Brief Narrative:  [phone] with a history of lymphoma requiring abdominal radiation resulting in multiple bouts of obstruction/adhesions/infection, partial small bowel resection with end ileostomy November 2020, DVT/PE, HTN, DM2, and HLD who presented to the ED with intractable vomiting for approximately 12 hours.  In the ED vitals were stable but CT abdomen noted evidence of a small bowel obstruction with transition point at the level of the mid small bowel.  NG tube was placed.  Consultants:  General Surgery  Code Status: FULL CODE  Antimicrobials:  None  DVT prophylaxis: Lovenox  Interim Hx: No acute events reported overnight.  Blood pressure stable.  Saturation 94% on room air.  Afebrile.  CBG controlled.  Phosphorus below target at 2.1.  Potassium below target at 3.6.  KUB this morning is suggestive of persistent SBO.  Assessment & Plan:  Small bowel obstruction - complicated hx of prior obstruction/adhesions/infection/surgery General Surgery directing care of this issue - NG in place  High-grade B-cell lymphoma Currently in remission -followed by [name]   Low-grade hypophosphatemia Supplement to assure maximal environment for bowel function   DM2 - controlled CBG controlled at this time  HTN Blood pressure controlled at this time  Hypomagnesemia Corrected with supplementation   Family Communication: No family present at time of exam Disposition: From home - should be able to return home  Objective: Blood pressure (!) 140/91, pulse 88, temperature 99 F (37.2 C), temperature source Oral, resp. rate 18, height 5' 9"  (1.753 m), weight 106 kg, SpO2 94 %.  Intake/Output Summary (Last 24 hours) at 10/05/2021 0853 Last data filed at 10/05/2021 0700 Gross per 24 hour  Intake 1464.2 ml  Output 1930 ml  Net -465.8 ml    Filed Weights   10/03/21 1611  Weight: 106 kg     Examination: Examined by Gen Surgery today - medical issues stable presently   CBC: Recent Labs  Lab 10/03/21 0716  WBC 6.9  HGB 13.9  HCT 41.1  MCV 98.6  PLT 938    Basic Metabolic Panel: Recent Labs  Lab 10/03/21 0716 10/03/21 1823 10/04/21 0424 10/05/21 0424  NA 136  --  138 136  K 3.9  --  3.6 3.6  CL 104  --  102 105  CO2 22  --  27 25  GLUCOSE 157*  --  103* 140*  BUN 15  --  12 11  CREATININE 1.00  --  1.00 0.99  CALCIUM 9.5  --  9.0 8.6*  MG  --  1.7 1.9  --   PHOS  --   --   --  2.1*    GFR: Estimated Creatinine Clearance: 89.2 mL/min (by C-G formula based on SCr of 0.99 mg/dL).  Liver Function Tests: Recent Labs  Lab 10/03/21 0716 10/04/21 0424  AST 35 27  ALT 57* 49*  ALKPHOS 63 65  BILITOT 0.5 0.8  PROT 7.3 7.0  ALBUMIN 3.9 3.8    Recent Labs  Lab 10/03/21 0716  LIPASE 10*    HbA1C: Hgb A1c MFr Bld  Date/Time Value Ref Range Status  06/23/2020 03:41 PM 5.8 (H) <5.7 % of total Hgb Final    Comment:    For someone without known diabetes, a hemoglobin  A1c value between 5.7% and 6.4% is consistent with prediabetes and should be confirmed with a  follow-up test. . For someone with known diabetes, a value <  7% indicates that their diabetes is well controlled. A1c targets should be individualized based on duration of diabetes, age, comorbid conditions, and other considerations. . This assay result is consistent with an increased risk of diabetes. . Currently, no consensus exists regarding use of hemoglobin A1c for diagnosis of diabetes for children. [name]   02/01/2020 12:07 PM 5.1 4.6 - 6.5 % Final    Comment:    Glycemic Control Guidelines for People with Diabetes:Non Diabetic:  <6%Goal of Therapy: <7%Additional Action Suggested:  >8%     CBG: Recent Labs  Lab 10/04/21 0745 10/04/21 1213 10/04/21 1750 10/04/21 1946 10/05/21 0741  GLUCAP 97 120* 134* 110* 140*     Scheduled Meds:  enoxaparin (LOVENOX) injection  40  mg Subcutaneous Q24H   insulin aspart  0-9 Units Subcutaneous TID WC   Continuous Infusions:  dextrose 5 % and 0.9 % NaCl with KCl 20 mEq/L 75 mL/hr at 10/04/21 2342     LOS: 1 day   Cherene Altes, MD [facility] Office  [phone] Pager - Text Page per Shea Evans  If 7PM-7AM, please contact night-coverage per Amion 10/05/2021, 8:53 AM

## 2021-10-05 NOTE — Progress Notes (Addendum)
Entered room upon shift change with night nurse and patient stated that he was going to pull out NGT which had started to back out after sticker became removed from nose. I proceeded to help patient remove NGT after he stated he would pull it out by his self if I did not remove it.

## 2021-10-05 NOTE — Consult Note (Addendum)
[facility] Nurse ostomy consult note Stoma type/location: RUQ, end ileostomy  Stomal assessment/size: 1" round, budded Peristomal assessment: NA;; wife changed pouch while WOC was out looking for additional pouches Treatment options for stomal/peristomal skin: patient uses 2" skin barrier ring  Output liquid brown Ostomy pouching: 2pc. 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   They remembered my care from several years ago, very nice patient and wife.  Discussed POC with patient and bedside nurse.  Re consult if needed, will not follow at this time. Thanks  Marvion Bastidas [employer], RN,CWOCN, CNS, [place] [phone])

## 2021-10-06 DIAGNOSIS — K56609 Unspecified intestinal obstruction, unspecified as to partial versus complete obstruction: Secondary | ICD-10-CM | POA: Diagnosis not present

## 2021-10-06 LAB — CBC
HCT: 40.3 % (ref 39.0–52.0)
Hemoglobin: 13.6 g/dL (ref 13.0–17.0)
MCH: 34.2 pg — ABNORMAL HIGH (ref 26.0–34.0)
MCHC: 33.7 g/dL (ref 30.0–36.0)
MCV: 101.3 fL — ABNORMAL HIGH (ref 80.0–100.0)
Platelets: 199 10*3/uL (ref 150–400)
RBC: 3.98 MIL/uL — ABNORMAL LOW (ref 4.22–5.81)
RDW: 13.8 % (ref 11.5–15.5)
WBC: 7.7 10*3/uL (ref 4.0–10.5)
nRBC: 0 % (ref 0.0–0.2)

## 2021-10-06 LAB — BASIC METABOLIC PANEL
Anion gap: 7 (ref 5–15)
BUN: 9 mg/dL (ref 8–23)
CO2: 23 mmol/L (ref 22–32)
Calcium: 8.4 mg/dL — ABNORMAL LOW (ref 8.9–10.3)
Chloride: 105 mmol/L (ref 98–111)
Creatinine, Ser: 0.89 mg/dL (ref 0.61–1.24)
GFR, Estimated: >60 mL/min (ref >60)
Glucose, Bld: 118 mg/dL — ABNORMAL HIGH (ref 70–99)
Potassium: 3.8 mmol/L (ref 3.5–5.1)
Sodium: 135 mmol/L (ref 135–145)

## 2021-10-06 LAB — PHOSPHORUS: Phosphorus: 3 mg/dL (ref 2.5–4.6)

## 2021-10-06 LAB — MAGNESIUM: Magnesium: 1.8 mg/dL (ref 1.7–2.4)

## 2021-10-06 LAB — GLUCOSE, CAPILLARY
Glucose-Capillary: 133 mg/dL — ABNORMAL HIGH (ref 70–99)
Glucose-Capillary: 145 mg/dL — ABNORMAL HIGH (ref 70–99)

## 2021-10-06 MED ORDER — BOOST / RESOURCE BREEZE PO LIQD CUSTOM
1.0000 | Freq: Two times a day (BID) | ORAL | Status: DC
  Administered 2021-10-06: 09:00:00 1 via ORAL

## 2021-10-06 MED ORDER — PANTOPRAZOLE SODIUM 40 MG PO TBEC
40.0000 mg | DELAYED_RELEASE_TABLET | Freq: Every day | ORAL | Status: DC
  Administered 2021-10-06: 09:00:00 40 mg via ORAL
  Filled 2021-10-06: qty 1

## 2021-10-06 NOTE — Progress Notes (Signed)
Patient ID: Mark Frey, male   DOB: 1956/03/22, 66 y.o.   MRN: [id number] [facility] Surgery Progress Note     Subjective: CC-  Feels great. States that he had his NG removed at 1900 last night. Denies any abdominal pain, n/v. Tolerating clear liquids. Ostomy productive.  Objective: Vital signs in last 24 hours: Temp:  [98.3 F (36.8 C)-98.7 F (37.1 C)] 98.3 F (36.8 C) (01/24 0607) Pulse Rate:  [77-89] 77 (01/24 0607) Resp:  [16-20] 16 (01/24 0607) BP: (140-143)/(81-94) 143/94 (01/24 0607) SpO2:  [97 %-99 %] 99 % (01/24 0607) Last BM Date: 10/06/21  Intake/Output from previous day: 01/23 0701 - 01/24 0700 In: -  Out: 250 [Urine:150; Stool:100] Intake/Output this shift: Total I/O In: -  Out: 100 [Stool:100]  PE: Gen:  Alert, NAD, pleasant Pulm:  rate and effort normal Abd: Soft, ND, NT, ostomy with small amount of stool in bag (per patient it was just emptied)  Lab Results:  Recent Labs    10/06/21 0440  WBC 7.7  HGB 13.6  HCT 40.3  PLT 199   BMET Recent Labs    10/05/21 0424 10/06/21 0440  NA 136 135  K 3.6 3.8  CL 105 105  CO2 25 23  GLUCOSE 140* 118*  BUN 11 9  CREATININE 0.99 0.89  CALCIUM 8.6* 8.4*   PT/INR No results for input(s): LABPROT, INR in the last 72 hours. CMP     Component Value Date/Time   NA 135 10/06/2021 0440   K 3.8 10/06/2021 0440   CL 105 10/06/2021 0440   CO2 23 10/06/2021 0440   GLUCOSE 118 (H) 10/06/2021 0440   BUN 9 10/06/2021 0440   CREATININE 0.89 10/06/2021 0440   CREATININE 1.01 07/15/2021 1407   CREATININE 1.08 06/23/2020 1541   CALCIUM 8.4 (L) 10/06/2021 0440   PROT 7.0 10/04/2021 0424   ALBUMIN 3.8 10/04/2021 0424   AST 27 10/04/2021 0424   AST 34 07/15/2021 1407   ALT 49 (H) 10/04/2021 0424   ALT 48 (H) 07/15/2021 1407   ALKPHOS 65 10/04/2021 0424   BILITOT 0.8 10/04/2021 0424   BILITOT 0.3 07/15/2021 1407   GFRNONAA >60 10/06/2021 0440   GFRNONAA >60 07/15/2021 1407   GFRNONAA 73  06/23/2020 1541   GFRAA 84 06/23/2020 1541   Lipase     Component Value Date/Time   LIPASE 10 (L) 10/03/2021 0716       Studies/Results: DG Abd Portable 1V  Result Date: 10/05/2021 CLINICAL DATA:  Small bowel obstruction. EXAM: PORTABLE ABDOMEN - 1 VIEW COMPARISON:  October 04, 2021. FINDINGS: Nasogastric tube tip is seen in proximal stomach. Small bowel dilatation is noted concerning for distal small bowel obstruction. IMPRESSION: Stable small bowel dilatation concerning for obstruction. Stable position of nasogastric tube. Electronically Signed   By: Marijo Conception M.D.   On: 10/05/2021 08:40   DG Abd Portable 1V  Result Date: 10/04/2021 CLINICAL DATA:  NG tube reposition. EXAM: PORTABLE ABDOMEN - 1 VIEW COMPARISON:  Abdominal radiograph 10/04/2021. FINDINGS: Enteric tube tip and side-port project over the stomach. Left basilar atelectasis. Persistent small-bowel obstruction. Small bowel loops measure up to 5 cm. IMPRESSION: Persistent small bowel obstruction. Enteric tube projects over the stomach. Electronically Signed   By: Lovey Newcomer M.D.   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 HTN DM HLD Hx DVT/PE   Plan: Continues to improve - tolerating clears and having bowel function. Advance to full liquids, and advance as tolerated to soft diet. If patient tolerates diet advancement and continues to have bowel function he is ok for discharge from surgical standpoint. Discussed continuing liquid/soft diet at home for several days.   Straightforward Medical Decision Making   LOS: 2 days    Wellington Hampshire, [facility] Surgery 10/06/2021, 8:59 AM Please see Amion for pager number during day hours 7:00am-4:30pm

## 2021-10-06 NOTE — Discharge Summary (Signed)
DISCHARGE SUMMARY  Mark Frey  MR#: [id number]  DOB:Jan 13, 1956  Date of Admission: 10/03/2021 Date of Discharge: 10/06/2021  Attending Physician:Carman Essick Hennie Duos, MD  Patient's UXL:[id number], Mark Mars, MD  Consults: Gen Surgery  Disposition: D/C home   Follow-up Appts:  Follow-up Information     [name], MD Follow up in 10 day(s).   Specialty: Family Medicine Why: If you are doing well and having no further issues, a specific follow up is not necessarily required, and you can simply keep your regular medical follow up routine. 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  In the ED vitals were stable but CT abdomen noted evidence of a small bowel obstruction with transition point at the level of the mid small bowel.  NG tube was placed.  Hospital Course: Patient was admitted to the acute units after the above-noted presentation.  NG remains in place during initial portion of his hospital stay and his oral intake was limited.  Patient rapidly began to improve spontaneously.  His NG was successfully removed the night of 10/05/2021.  Diet was carefully advanced under direction of general surgery and patient tolerated this without difficulty.  His symptoms were fully relieved and he was tolerating a soft diet on 10/06/2021 and was therefore cleared for discharge home.  Small bowel obstruction -  complicated hx of prior obstruction/adhesions/infection/surgery General Surgery directed care of this issue - NG removed as of 10/05/21 PM - tolerating soft diet at time of d/c    High-grade B-cell lymphoma Currently in remission - followed by [name]    Low-grade hypophosphatemia Corrected with supplementation   DM2 - controlled CBG controlled    HTN Blood pressure controlled    Hypomagnesemia Corrected with supplementation  Allergies as of 10/06/2021       Reactions   Ciprofloxacin Other (See Comments)   Leg tingling        Medication List     TAKE these medications    acetaminophen 325 MG tablet Commonly known as: TYLENOL Take 2 tablets (650 mg total) by mouth every 6 (six) hours as needed for mild pain (or temp > 100).   citalopram 10 MG tablet Commonly known as: CELEXA TAKE 1 TABLET BY MOUTH EVERY DAY   Devrom 200 MG Caps Generic drug: Bismuth Subgallate Take 200 mg by mouth daily.   loperamide 2 MG capsule Commonly known as: IMODIUM Take 2 mg by mouth as needed for diarrhea or loose stools.   Magnesium Oxide -Mg Supplement 400 MG Caps Take 400 mg by mouth daily at 6 (six) AM.   VITAMIN B COMPLEX PO Take 1 tablet by mouth daily.        Day of Discharge BP 117/76 (BP Location: Left Arm)    Pulse (!) 102    Temp 99.2 F (37.3 C) (Oral)    Resp 17    Ht 5' 9"  (1.753 m)    Wt 106 kg    SpO2 98%    BMI 34.51 kg/m  Physical Exam: General: No acute respiratory distress Lungs: Clear to auscultation bilaterally without wheezes or crackles Cardiovascular: Regular rate and rhythm without murmur gallop or rub normal S1 and S2 Abdomen: NT/ND, stool and fluid in ostomy, no mass, no rebound  Extremities: No significant cyanosis, clubbing, or edema bilateral lower extremities  Basic Metabolic Panel: Recent Labs  Lab 10/03/21 0716 10/03/21 1823 10/04/21 0424 10/05/21 0424 10/06/21 0440  NA 136  --  138 136 135  K 3.9  --  3.6 3.6 3.8  CL 104  --   102 105 105  CO2 22  --  27 25 23   GLUCOSE 157*  --  103* 140* 118*  BUN 15  --  12 11 9   CREATININE 1.00  --  1.00 0.99 0.89  CALCIUM 9.5  --  9.0 8.6* 8.4*  MG  --  1.7 1.9  --  1.8  PHOS  --   --   --  2.1* 3.0    Liver Function Tests: Recent Labs  Lab 10/03/21 0716 10/04/21 0424  AST 35 27  ALT 57* 49*  ALKPHOS 63 65  BILITOT 0.5 0.8  PROT 7.3 7.0  ALBUMIN 3.9 3.8   Recent Labs  Lab 10/03/21 0716  LIPASE 10*   CBC: Recent Labs  Lab 10/03/21 0716 10/06/21 0440  WBC 6.9 7.7  HGB 13.9 13.6  HCT 41.1 40.3  MCV 98.6 101.3*  PLT 208 199    Time spent in discharge (includes decision making & examination of pt): 30 minutes  10/06/2021, 2:22 PM   Cherene Altes, MD [facility] Office  [phone]

## 2021-10-06 NOTE — Progress Notes (Signed)
°  Transition of Care [facility]) Screening Note   Patient Details  Name: Mark Frey Date of Birth: [birth year]   Transition of Care [facility]) CM/SW Contact:    Dessa Phi, RN Phone Number: 10/06/2021, 2:24 PM    Transition of Care Department [facility]) has reviewed patient and no TOC needs have been identified at this time. We will continue to monitor patient advancement through interdisciplinary progression rounds. If new patient transition needs arise, please place a TOC consult.

## 2021-10-06 NOTE — Plan of Care (Signed)

## 2021-10-06 NOTE — Progress Notes (Signed)
Mark Frey has tolerated an advancement in diet today from clear liquids to a soft diet. He's had no nausea or abd pain and his ileostomy is draining mushy stool. He has been ambulating around his room independently. D/C orders noted. D/C instructions reviewed w/ pt and wife. Both verbalize understanding and all questions answered. Pt in possession of d/c packet and all personal belongings. Pt d/c in w/c in stable condition to wife's car.

## 2021-10-08 ENCOUNTER — Telehealth: Payer: Self-pay

## 2021-10-08 NOTE — Telephone Encounter (Signed)
We are happy to see patient if he changes his mind

## 2021-10-08 NOTE — Telephone Encounter (Signed)
Transition Care Management Follow-up Telephone Call Date of discharge and from where: [facility] 10/06/21 How have you been since you were released from the hospital? Doing well Any questions or concerns? No  Items Reviewed: Did the pt receive and understand the discharge instructions provided? Yes  Medications obtained and verified? Yes  Other? No  Any new allergies since your discharge? No  Dietary orders reviewed? Yes Do you have support at home? Yes   Home Care and Equipment/Supplies: Were home health services ordered? not applicable If so, what is the name of the agency?   Has the agency set up a time to come to the patient's home? not applicable Were any new equipment or medical supplies ordered?  No What is the name of the medical supply agency?  Were you able to get the supplies/equipment? not applicable Do you have any questions related to the use of the equipment or supplies? No  Functional Questionnaire: (I = Independent and D = Dependent) ADLs: I  Bathing/Dressing- I  Meal Prep- I  Eating- I  Maintaining continence- I  Transferring/Ambulation- I  Managing Meds- I  Follow up appointments reviewed:  PCP Hospital f/u appt confirmed? No   Specialist Hospital f/u appt confirmed? No   Are transportation arrangements needed? No  If their condition worsens, is the pt aware to call PCP or go to the Emergency Dept.? Yes Was the patient provided with contact information for the PCP's office or ED? Yes Was to pt encouraged to call back with questions or concerns? Yes

## 2021-11-04 ENCOUNTER — Encounter: Payer: Self-pay | Admitting: Hematology

## 2021-12-25 ENCOUNTER — Telehealth: Payer: Self-pay | Admitting: Hematology

## 2021-12-25 NOTE — Telephone Encounter (Signed)
Rescheduled upcoming appointment due to a change in the provider's template. Patient's wife is aware of changes. ?

## 2022-01-12 ENCOUNTER — Inpatient Hospital Stay: Payer: Medicare Other | Attending: Hematology

## 2022-01-12 ENCOUNTER — Inpatient Hospital Stay (HOSPITAL_BASED_OUTPATIENT_CLINIC_OR_DEPARTMENT_OTHER): Payer: Medicare Other | Admitting: Hematology

## 2022-01-12 ENCOUNTER — Encounter: Payer: Self-pay | Admitting: Hematology

## 2022-01-12 ENCOUNTER — Other Ambulatory Visit: Payer: Self-pay

## 2022-01-12 ENCOUNTER — Ambulatory Visit: Payer: PRIVATE HEALTH INSURANCE | Admitting: Hematology

## 2022-01-12 ENCOUNTER — Other Ambulatory Visit: Payer: PRIVATE HEALTH INSURANCE

## 2022-01-12 VITALS — BP 121/78 | HR 93 | Temp 97.9°F | Resp 20 | Wt 226.5 lb

## 2022-01-12 DIAGNOSIS — C8378 Burkitt lymphoma, lymph nodes of multiple sites: Secondary | ICD-10-CM

## 2022-01-12 DIAGNOSIS — Z8572 Personal history of non-Hodgkin lymphomas: Secondary | ICD-10-CM | POA: Diagnosis present

## 2022-01-12 DIAGNOSIS — Z86711 Personal history of pulmonary embolism: Secondary | ICD-10-CM | POA: Diagnosis not present

## 2022-01-12 DIAGNOSIS — Z9221 Personal history of antineoplastic chemotherapy: Secondary | ICD-10-CM | POA: Insufficient documentation

## 2022-01-12 DIAGNOSIS — Z86718 Personal history of other venous thrombosis and embolism: Secondary | ICD-10-CM | POA: Insufficient documentation

## 2022-01-12 DIAGNOSIS — Z923 Personal history of irradiation: Secondary | ICD-10-CM | POA: Insufficient documentation

## 2022-01-12 LAB — LACTATE DEHYDROGENASE: LDH: 120 U/L (ref 98–192)

## 2022-01-12 LAB — CBC WITH DIFFERENTIAL/PLATELET
Abs Immature Granulocytes: 0.01 10*3/uL (ref 0.00–0.07)
Basophils Absolute: 0 10*3/uL (ref 0.0–0.1)
Basophils Relative: 1 %
Eosinophils Absolute: 0 10*3/uL (ref 0.0–0.5)
Eosinophils Relative: 1 %
HCT: 43.1 % (ref 39.0–52.0)
Hemoglobin: 14.7 g/dL (ref 13.0–17.0)
Immature Granulocytes: 0 %
Lymphocytes Relative: 28 %
Lymphs Abs: 1.8 10*3/uL (ref 0.7–4.0)
MCH: 33.8 pg (ref 26.0–34.0)
MCHC: 34.1 g/dL (ref 30.0–36.0)
MCV: 99.1 fL (ref 80.0–100.0)
Monocytes Absolute: 0.6 10*3/uL (ref 0.1–1.0)
Monocytes Relative: 9 %
Neutro Abs: 3.9 10*3/uL (ref 1.7–7.7)
Neutrophils Relative %: 61 %
Platelets: 196 10*3/uL (ref 150–400)
RBC: 4.35 MIL/uL (ref 4.22–5.81)
RDW: 14.1 % (ref 11.5–15.5)
WBC: 6.4 10*3/uL (ref 4.0–10.5)
nRBC: 0 % (ref 0.0–0.2)

## 2022-01-12 LAB — CMP (CANCER CENTER ONLY)
ALT: 54 U/L — ABNORMAL HIGH (ref 0–44)
AST: 39 U/L (ref 15–41)
Albumin: 4.1 g/dL (ref 3.5–5.0)
Alkaline Phosphatase: 68 U/L (ref 38–126)
Anion gap: 6 (ref 5–15)
BUN: 11 mg/dL (ref 8–23)
CO2: 26 mmol/L (ref 22–32)
Calcium: 9.8 mg/dL (ref 8.9–10.3)
Chloride: 107 mmol/L (ref 98–111)
Creatinine: 0.99 mg/dL (ref 0.61–1.24)
GFR, Estimated: >60 mL/min (ref >60)
Glucose, Bld: 99 mg/dL (ref 70–99)
Potassium: 4 mmol/L (ref 3.5–5.1)
Sodium: 139 mmol/L (ref 135–145)
Total Bilirubin: 0.3 mg/dL (ref 0.3–1.2)
Total Protein: 7.5 g/dL (ref 6.5–8.1)

## 2022-01-15 NOTE — Progress Notes (Signed)
? ? ?HEMATOLOGY/ONCOLOGY CLINIC NOTE ? ?Date of Service: .01/12/2022 ? ? ?Patient Care Team: ?Mark Olp, MD as PCP - General (Family Medicine) ? 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 ? ?HISTORY OF PRESENTING ILLNESS:  ?See previous note for details on initial presentation. ? ?INTERVAL HISTORY:  ? ?Mark Frey evaluation and management of his Burkitt's lymphoma.  He is accompanied by his wife for this clinic visit.Mark Frey ?Patient notes no acute new symptoms since last clinic visit. ?He did have 1 episode of hospitalization for functional small bowel obstruction that resolved with conservative management. ?Continues to have no issues with his ostomy ?Good p.o. intake.  Weight is stable. ?No fevers no chills no night sweats no unexpected weight loss.  No new fatigue. ?No other acute new focal symptoms concerning for lymphoma progression. ?Labs done today were discussed in detail. ? ?MEDICAL HISTORY:  ?Past Medical History:  ?Diagnosis Date  ? ALLERGIC RHINITIS   ? Cancer [facility])   ? Lymphoma   ? Diabetes mellitus   ? History of deep venous thrombosis (DVT) of distal vein of right lower extremity   ? Also bilateral PE.  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 so was considered provoked  ? Hyperlipidemia   ? ? ?SURGICAL HISTORY: ?Past Surgical History:  ?Procedure Laterality Date  ? BIOPSY  03/15/2018  ? Procedure: BIOPSY;  Surgeon: Milus Banister, MD;  Location: [facility];  Service: Endoscopy;;  ? BIOPSY  03/01/2019  ? Procedure: BIOPSY;  Surgeon: Ladene Artist, MD;  Location: [name];  Service: Endoscopy;;  ? BOWEL RESECTION N/A 08/25/2018  ? Procedure: LAPROSCOPIC LOOP ILEOSTOMY;  Surgeon: Greer Pickerel, MD;  Location: [facility];  Service: General;  Laterality: N/A;  ? CLEFT PALATE REPAIR    ? COLECTOMY  07/30/2019  ? COLONOSCOPY N/A 03/15/2018  ? Procedure: COLONOSCOPY;  Surgeon: Milus Banister, MD;  Location: [name];  Service: Endoscopy;  Laterality: N/A;  ? COLONOSCOPY N/A 08/20/2018  ? Procedure: COLONOSCOPY;  Surgeon: Irene Shipper, MD;  Location: [name];  Service: Endoscopy;  Laterality: N/A;  ? deviated septum repair    ? slight improvement  ? ESOPHAGOGASTRODUODENOSCOPY N/A 03/15/2018  ? Procedure: ESOPHAGOGASTRODUODENOSCOPY (EGD);  Surgeon: Milus Banister, MD;  Location: [name];  Service: Endoscopy;  Laterality: N/A;  ? FLEXIBLE SIGMOIDOSCOPY N/A 03/01/2019  ? Procedure: FLEXIBLE SIGMOIDOSCOPY;  Surgeon: Ladene Artist, MD;  Location: [name];  Service: Endoscopy;  Laterality: N/A;  ? ILEOSCOPY N/A 03/01/2019  ? Procedure: ILEOSCOPY THROUGH STOMA;  Surgeon: Ladene Artist, MD;  Location: [name];  Service: Endoscopy;  Laterality: N/A;  ? IR IMAGING GUIDED PORT INSERTION  03/17/2018  ? IR REMOVAL TUN ACCESS W/ PORT W/O FL MOD SED  09/22/2021  ? LAPAROSCOPY N/A 08/25/2018  ? Procedure: LAPAROSCOPY DIAGNOSTIC;  Surgeon: Greer Pickerel, MD;  Location: [facility];  Service: General;  Laterality: N/A;  ? TONSILLECTOMY    ? ? ?SOCIAL HISTORY: ?Social History  ? ?Socioeconomic History  ? Marital status: Married  ?  Spouse name: Mark Frey  ? Number of children: 0  ? Years of education: Not on file  ? Highest education level: Not on file  ?Occupational History  ? Not on file  ?Tobacco Use  ? Smoking status: Never  ? Smokeless tobacco: Never  ?Vaping Use  ? Vaping Use: Never used  ?Substance and Sexual Activity  ? Alcohol use: Yes  ?  Comment: occasional  ? Drug use: No  ? Sexual activity: Yes  ?Other Topics Concern  ? Not on file  ?Social History Narrative  ? Married 1985. No kids. 4 small dogs.   ?   ? Works in Financial trader, residential  ?   ? Hobbies: work on cars, Haematologist, exercise as able  ? ?Social Determinants of Health  ? ?Financial Resource Strain: Not on file  ?Food Insecurity: Not on file  ?Transportation Needs: Not on file  ?Physical Activity: Not on file  ?Stress: Not  on file  ?Social Connections: Not on file  ?Intimate Partner Violence: Not on file  ? ? ?FAMILY HISTORY: ?Family History  ?Problem Relation Age of Onset  ? Lung cancer Mother   ?     smoker  ? Brain cancer Mother   ?     metastasis  ? AAA (abdominal aortic aneurysm) Father   ?     smoker  ? ? ?ALLERGIES:  is allergic to ciprofloxacin. ? ?MEDICATIONS:  ?Current Outpatient Medications  ?Medication Sig Dispense Refill  ? acetaminophen (TYLENOL) 325 MG tablet Take 2 tablets (650 mg total) by mouth every 6 (six) hours as needed for mild pain (or temp > 100).    ? B Complex Vitamins (VITAMIN B COMPLEX PO) Take 1 tablet by mouth daily.     ? Bismuth Subgallate (DEVROM) 200 MG CAPS Take 200 mg by mouth daily.    ? citalopram (CELEXA) 10 MG tablet TAKE 1 TABLET BY MOUTH EVERY DAY (Patient taking differently: Take 10 mg by mouth daily.) 90 tablet 3  ? loperamide (IMODIUM) 2 MG capsule Take 2 mg by mouth as needed for diarrhea or loose stools.    ? Magnesium Oxide -Mg Supplement 400 MG CAPS Take 400 mg by mouth daily at 6 (six) AM.    ? ?No current facility-administered medications for this visit.  ? ? ?REVIEW OF SYSTEMS:   ? ?10 Point review of Systems was done is negative except as noted above. ? ? ?PHYSICAL EXAMINATION: ?ECOG FS:2 - Symptomatic, <50% confined to bed ? ?Vitals:  ? 01/12/22 1250  ?BP: 121/78  ?Pulse: 93  ?Resp: 20  ?Temp: 97.9 ?F (36.6 ?C)  ?SpO2: 96%  ? ?Wt Readings from Last 3 Encounters:  ?01/12/22 226 lb 8 oz (102.7 kg)  ?10/03/21 233 lb 11 oz (106 kg)  ?07/15/21 243 lb 8 oz (110.5 kg)  ? ?Body mass index is 33.45 kg/m?.   ?. ?NAD ?GENERAL:alert, in no acute distress and comfortable ?SKIN: no acute rashes, no significant lesions ?EYES: conjunctiva are pink and non-injected, sclera anicteric ?OROPHARYNX: MMM, no exudates, no oropharyngeal erythema or ulceration ?NECK: supple, no JVD ?LYMPH:  no palpable lymphadenopathy in the cervical, axillary or inguinal regions ?LUNGS: clear to auscultation b/l with  normal respiratory effort ?HEART: regular rate & rhythm ?ABDOMEN:  normoactive bowel sounds , non tender, not distended.  Colostomy in situ with occlusive ?Extremity: no pedal edema ?PSYCH: alert & oriented x 3 with fluent speech ?NEURO: no focal motor/sensory deficits ? ? ?LABORATORY DATA:  ? ? ?  Latest Ref Rng & Units 01/12/2022  ? 12:41 PM 10/06/2021  ?  4:40 AM 10/03/2021  ?  7:16 AM  ?CBC  ?WBC 4.0 - 10.5 K/uL 6.4   7.7   6.9    ?Hemoglobin 13.0 - 17.0 g/dL 14.7   13.6   13.9    ?Hematocrit 39.0 - 52.0 % 43.1   40.3   41.1    ?  Platelets 150 - 400 K/uL 196   199   208    ? ? ? ? ?  Latest Ref Rng & Units 01/12/2022  ? 12:41 PM 10/06/2021  ?  4:40 AM 10/05/2021  ?  4:24 AM  ?CMP  ?Glucose 70 - 99 mg/dL 99   118   140    ?BUN 8 - 23 mg/dL 11   9   11     ?Creatinine 0.61 - 1.24 mg/dL 0.99   0.89   0.99    ?Sodium 135 - 145 mmol/L 139   135   136    ?Potassium 3.5 - 5.1 mmol/L 4.0   3.8   3.6    ?Chloride 98 - 111 mmol/L 107   105   105    ?CO2 22 - 32 mmol/L 26   23   25     ?Calcium 8.9 - 10.3 mg/dL 9.8   8.4   8.6    ?Total Protein 6.5 - 8.1 g/dL 7.5      ?Total Bilirubin 0.3 - 1.2 mg/dL 0.3      ?Alkaline Phos 38 - 126 U/L 68      ?AST 15 - 41 U/L 39      ?ALT 0 - 44 U/L 54      ? ?Lab Results  ?Component Value Date  ? LDH 120 01/12/2022  ? ? ?03/01/19 Colonoscopy biopsies: ? ? ? ? ?03/17/18 Cytogenetics: ? ? ?03/17/18 Colon Bx: ? ? ?03/13/18 Peritoneum Biopsy:  ? ? ? ?07/02/2019 PORTABLE CHEST 1 VIEW (Accession [id number])  ? ? ?07/02/2019 CT ABDOMEN AND PELVIS WITH CONTRAST (Accession [id number]) ? ?RADIOGRAPHIC STUDIES: ? ?I have personally reviewed the radiological images as listed and agreed with the findings in the report. ?No results found. ? ?ASSESSMENT & PLAN:  ? ?66 y.o. male with ?  ?1.  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 ?03/10/18 CT A/P revealed Large irregular infiltrative solid mass in the right lower quadrant measuring  up to 18.7 x 18.5 x 17.8 cm, infiltrating and encasing multiple distal small bowel loops and likely the ileocecal region, partially encasing the sigmoid colon, with prominent extension into the right lowe

## 2022-01-17 ENCOUNTER — Encounter: Payer: Self-pay | Admitting: Hematology

## 2022-01-25 ENCOUNTER — Telehealth: Payer: Self-pay | Admitting: Hematology

## 2022-01-25 NOTE — Telephone Encounter (Signed)
Left message with follow-up appointment per 5/2 los. ?

## 2022-02-18 ENCOUNTER — Encounter: Payer: Self-pay | Admitting: Family Medicine

## 2022-03-18 ENCOUNTER — Encounter: Payer: Self-pay | Admitting: Hematology

## 2022-04-01 ENCOUNTER — Ambulatory Visit (INDEPENDENT_AMBULATORY_CARE_PROVIDER_SITE_OTHER): Payer: Medicare Other | Admitting: Family Medicine

## 2022-04-01 ENCOUNTER — Encounter: Payer: Self-pay | Admitting: Family Medicine

## 2022-04-01 VITALS — BP 110/70 | HR 60 | Temp 98.1°F | Ht 69.0 in | Wt 222.2 lb

## 2022-04-01 DIAGNOSIS — E119 Type 2 diabetes mellitus without complications: Secondary | ICD-10-CM

## 2022-04-01 LAB — LIPID PANEL
Cholesterol: 100 mg/dL (ref 0–200)
HDL: 25.6 mg/dL — ABNORMAL LOW (ref >39.00)
LDL Cholesterol: 45 mg/dL (ref 0–99)
NonHDL: 74.36
Total CHOL/HDL Ratio: 4
Triglycerides: 145 mg/dL (ref 0.0–149.0)
VLDL: 29 mg/dL (ref 0.0–40.0)

## 2022-04-01 LAB — POCT GLYCOSYLATED HEMOGLOBIN (HGB A1C): Hemoglobin A1C: 5.9 % — AB (ref 4.0–5.6)

## 2022-04-01 LAB — MICROALBUMIN / CREATININE URINE RATIO
Creatinine,U: 163 mg/dL
Microalb Creat Ratio: 21.4 mg/g (ref 0.0–30.0)
Microalb, Ur: 35 mg/dL — ABNORMAL HIGH (ref 0.0–1.9)

## 2022-04-01 NOTE — Patient Instructions (Addendum)
Please stop by lab before you go If you have mychart- we will send your results within 3 business days of [place] receiving them.  If you do not have mychart- we will call you about results within 5 business days of [place] receiving them.  *please also note that you will see labs on mychart as soon as they post. I will later go in and write notes on them- will say "notes from Dr. Yong Channel"   Trial citalopram every other day at 10 mg for a week and then stop-if any recurrent symptoms of depression develop please let me know ASAP and can start back on medication.  If any thoughts of self-harm please call us or [phone] or [phone] immediately  Recommended follow up: Return in about 6 months (around 10/02/2022) for followup or sooner if needed.Schedule b4 you leave.

## 2022-04-01 NOTE — Progress Notes (Signed)
Phone [phone] In person visit   Subjective:   Mark Frey is a 66 y.o. year old very pleasant male patient who presents for/with See problem oriented charting Chief Complaint  Patient presents with   Diabetes   right toenail    Pt c/o left big toenail fungus not getting better. After discussion not concerned in terbinafine due to liver risks or topicals   Past Medical History-  Patient Active Problem List   Diagnosis Date Noted   Colostomy status ([place]) 02/02/2020    Priority: High   Status post colectomy 10/09/2019    Priority: High   History of B-cell lymphoma (high grade B cell lymphoma/Burkitt Lymphoma) 03/15/2018    Priority: High   History of pulmonary embolism 03/10/2018    Priority: High   Controlled diabetes mellitus type II without complication ([place]) 84/13/2440    Priority: High   Depression, major, single episode, complete remission ([place]) 04/17/2019    Priority: Medium    Hypomagnesemia 01/11/2019    Priority: Medium    Edema     Priority: Medium    Tachycardia 01/31/2017    Priority: Medium    Hypertension associated with diabetes ([place]) 06/05/2014    Priority: Medium    Hyperlipidemia associated with type 2 diabetes mellitus ([place]) 05/12/2007    Priority: Medium    Radiation gastroenteritis     Priority: Low   Radiation colitis     Priority: Low   Loop ilesotomy for fecal diversion of sigmoid stricture 08/25/2018    Priority: Low   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[place]) 08/19/2018    Priority: Low   Counseling regarding advance care planning and goals of care 04/25/2018    Priority: Low   Dysmetabolic syndrome X 07/10/2535    Priority: Low   BACK PAIN WITH RADICULOPATHY 06/09/2007    Priority: Low   ALLERGIC RHINITIS 05/12/2007    Priority: Low   Essential hypertension 10/03/2021   Elevated ALT measurement 10/03/2021   Port-A-Cath in place 03/27/2018    Medications- reviewed and updated Current Outpatient Medications  Medication Sig  Dispense Refill   acetaminophen (TYLENOL) 325 MG tablet Take 2 tablets (650 mg total) by mouth every 6 (six) hours as needed for mild pain (or temp > 100).     B Complex Vitamins (VITAMIN B COMPLEX PO) Take 1 tablet by mouth daily.      Bismuth Subgallate (DEVROM) 200 MG CAPS Take 200 mg by mouth daily.     citalopram (CELEXA) 10 MG tablet TAKE 1 TABLET BY MOUTH EVERY DAY (Patient taking differently: Take 10 mg by mouth daily.) 90 tablet 3   loperamide (IMODIUM) 2 MG capsule Take 2 mg by mouth as needed for diarrhea or loose stools.     Magnesium Oxide -Mg Supplement 400 MG CAPS Take 400 mg by mouth daily at 6 (six) AM.     No current facility-administered medications for this visit.     Objective:  BP 110/70   Pulse 60   Temp 98.1 F (36.7 C)   Ht 5' 9"  (1.753 m)   Wt 222 lb 3.2 oz (100.8 kg)   SpO2 97%   BMI 32.81 kg/m  Gen: NAD, resting comfortably CV: RRR no murmurs rubs or gallops Lungs: CTAB no crackles, wheeze, rhonchi Ext: no edema Skin: warm, dry Diabetic Foot Exam - Simple   Simple Foot Form Diabetic Foot exam was performed with the following findings: Yes 04/01/2022 11:14 AM  Visual Inspection No deformities, no ulcerations, no other  skin breakdown bilaterally: Yes Sensation Testing Intact to touch and monofilament testing bilaterally: Yes Pulse Check Posterior Tibialis and Dorsalis pulse intact bilaterally: Yes Comments       Assessment and Plan   % History of high-grade B-cell lymphoma/Burkitt lymphoma  -Discovered on CT March 10, 2018-on initial measures 18.7 x 18.5 x 17.8 cm -S/p IMRT with 36 Gy over 20 fractions, between 11/09/18 and 12/06/18 with Dr. Isidore Moos -Patient completed chemotherapy October 2019 -Patient later developed small bowel obstruction/ileus-sigmoid obstructive lesion with question of scar tissue from resolved lymphoma versus postinflammatory scarring from diverticulitis/limited perforation-ultimately required diverting ileostomy 08/25/2018  with Dr. Greer Pickerel  - still deals with ostomy/fistula issues- surgeon does not think he should have this reversed - most recently seen 01/12/22 and considered in remission with 6 month follow up planned  #hypertension-has been off medication since weight loss related to Burkitt lymphoma and subsequent complications S: medication: None  A/P: Controlled. Continue without medications.    # Diabetes-has been off medication since weight loss related to Burkitt lymphoma and subsequent complications S: Medication: None  CBGs-does not check  Exercise and diet-peak weight 298 prior to Burkitt's lymphoma - has maintined in 220s recently  Lab Results  Component Value Date   HGBA1C 5.9 (A) 04/01/2022   A/P: Controlled. Continue current medications.    # Depression S: Medication: celexa 20m    04/01/2022   11:00 AM 06/23/2020    2:39 PM 02/01/2020   11:25 AM  Depression screen PHQ 2/9  Decreased Interest 0 0 0  Down, Depressed, Hopeless 0  0  PHQ - 2 Score 0 0 0  Altered sleeping 0 0 0  Tired, decreased energy 0 0 0  Change in appetite 0 0 0  Feeling bad or failure about yourself  0 0 0  Trouble concentrating 0 0 0  Moving slowly or fidgety/restless 0 0 0  Suicidal thoughts 0 0 0  PHQ-9 Score 0 0 0  Difficult doing work/chores Not difficult at all  Not difficult at all  A/P: full remission doing very well- Trial citalopram every other day at 10 mg for a week and then stop-if any recurrent symptoms of depression develop please let me know ASAP and can start back on medication.  If any thoughts of self-harm please call [email or web address] [phone] or [phone] immediately   #hyperlipidemia- refuses statin despite diabetes S: Medication: None  Lab Results  Component Value Date   CHOL 144 06/23/2020   HDL 36 (L) 06/23/2020   LDLCALC 82 06/23/2020   LDLDIRECT 80.0 02/01/2020   TRIG 154 (H) 06/23/2020   CHOLHDL 4.0 06/23/2020  A/P: not interested in medicine even if #s remain slightly high -discussed ct cardiac  scoring and wants to hold off for now   # Mild LFT elevation- ALT and AST slightly elevated-suspect fatty liver Lab Results  Component Value Date   ALT 54 (H) 01/12/2022   AST 39 01/12/2022   ALKPHOS 68 01/12/2022   BILITOT 0.3 01/12/2022   Recommended follow up: Return in about 6 months (around 10/02/2022) for followup or sooner if needed.Schedule b4 you leave. Future Appointments  Date Time [place] 07/16/2022  1:00 PM [facility] None  07/16/2022  1:30 PM [facility]    Lab/Order associations:   ICD-10-CM   1. Controlled type 2 diabetes mellitus without complication, without long-term current use of insulin (HCC)  E11.9 Microalbumin / creatinine urine ratio    Lipid panel  POCT HgB A1C    CANCELED: Hemoglobin A1c    CANCELED: Microalbumin / creatinine urine ratio    CANCELED: Hemoglobin A1c      No orders of the defined types were placed in this encounter.   Return precautions advised.  Garret Reddish, MD

## 2022-06-07 ENCOUNTER — Encounter: Payer: Self-pay | Admitting: *Deleted

## 2022-07-15 ENCOUNTER — Other Ambulatory Visit: Payer: Self-pay

## 2022-07-15 DIAGNOSIS — C8378 Burkitt lymphoma, lymph nodes of multiple sites: Secondary | ICD-10-CM

## 2022-07-16 ENCOUNTER — Inpatient Hospital Stay: Payer: Medicare Other

## 2022-07-16 ENCOUNTER — Inpatient Hospital Stay: Payer: Medicare Other | Attending: Hematology | Admitting: Hematology

## 2022-07-16 VITALS — BP 141/83 | HR 89 | Temp 98.1°F | Resp 16 | Wt 232.0 lb

## 2022-07-16 DIAGNOSIS — E785 Hyperlipidemia, unspecified: Secondary | ICD-10-CM | POA: Insufficient documentation

## 2022-07-16 DIAGNOSIS — Z8572 Personal history of non-Hodgkin lymphomas: Secondary | ICD-10-CM | POA: Insufficient documentation

## 2022-07-16 DIAGNOSIS — C8378 Burkitt lymphoma, lymph nodes of multiple sites: Secondary | ICD-10-CM | POA: Diagnosis not present

## 2022-07-16 DIAGNOSIS — Z86718 Personal history of other venous thrombosis and embolism: Secondary | ICD-10-CM | POA: Insufficient documentation

## 2022-07-16 DIAGNOSIS — Z923 Personal history of irradiation: Secondary | ICD-10-CM | POA: Diagnosis not present

## 2022-07-16 DIAGNOSIS — E119 Type 2 diabetes mellitus without complications: Secondary | ICD-10-CM | POA: Diagnosis not present

## 2022-07-16 LAB — CMP (CANCER CENTER ONLY)
ALT: 58 U/L — ABNORMAL HIGH (ref 0–44)
AST: 32 U/L (ref 15–41)
Albumin: 4.1 g/dL (ref 3.5–5.0)
Alkaline Phosphatase: 85 U/L (ref 38–126)
Anion gap: 4 — ABNORMAL LOW (ref 5–15)
BUN: 19 mg/dL (ref 8–23)
CO2: 28 mmol/L (ref 22–32)
Calcium: 9.5 mg/dL (ref 8.9–10.3)
Chloride: 108 mmol/L (ref 98–111)
Creatinine: 1.17 mg/dL (ref 0.61–1.24)
GFR, Estimated: >60 mL/min (ref >60)
Glucose, Bld: 129 mg/dL — ABNORMAL HIGH (ref 70–99)
Potassium: 4.4 mmol/L (ref 3.5–5.1)
Sodium: 140 mmol/L (ref 135–145)
Total Bilirubin: 0.3 mg/dL (ref 0.3–1.2)
Total Protein: 7.2 g/dL (ref 6.5–8.1)

## 2022-07-16 LAB — CBC WITH DIFFERENTIAL (CANCER CENTER ONLY)
Abs Immature Granulocytes: 0.01 10*3/uL (ref 0.00–0.07)
Basophils Absolute: 0.1 10*3/uL (ref 0.0–0.1)
Basophils Relative: 1 %
Eosinophils Absolute: 0.1 10*3/uL (ref 0.0–0.5)
Eosinophils Relative: 1 %
HCT: 41.6 % (ref 39.0–52.0)
Hemoglobin: 14.4 g/dL (ref 13.0–17.0)
Immature Granulocytes: 0 %
Lymphocytes Relative: 30 %
Lymphs Abs: 2.1 10*3/uL (ref 0.7–4.0)
MCH: 35.3 pg — ABNORMAL HIGH (ref 26.0–34.0)
MCHC: 34.6 g/dL (ref 30.0–36.0)
MCV: 102 fL — ABNORMAL HIGH (ref 80.0–100.0)
Monocytes Absolute: 0.6 10*3/uL (ref 0.1–1.0)
Monocytes Relative: 8 %
Neutro Abs: 4.2 10*3/uL (ref 1.7–7.7)
Neutrophils Relative %: 60 %
Platelet Count: 183 10*3/uL (ref 150–400)
RBC: 4.08 MIL/uL — ABNORMAL LOW (ref 4.22–5.81)
RDW: 13.5 % (ref 11.5–15.5)
WBC Count: 7 10*3/uL (ref 4.0–10.5)
nRBC: 0 % (ref 0.0–0.2)

## 2022-07-16 LAB — LACTATE DEHYDROGENASE: LDH: 120 U/L (ref 98–192)

## 2022-07-22 ENCOUNTER — Encounter: Payer: Self-pay | Admitting: Hematology

## 2022-07-22 NOTE — Progress Notes (Signed)
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  INTERVAL HISTORY:   Mark Frey is here in clinic today with his wife for continued evaluation and management of his Burkitt's lymphoma.  He was last seen in clinic about 6 months ago. He notes no acute new concerns since his last clinic visit.  No weight loss no lumps or bumps no fevers no chills no night sweats. No shortness of breath.  No new abdominal distention.. No unexpected weight loss. Labs done today were reviewed in detail with the patient.   MEDICAL HISTORY:  Past Medical History:  Diagnosis Date   ALLERGIC RHINITIS    Cancer ([place])    Lymphoma    Diabetes mellitus    History of deep venous thrombosis (DVT) of distal vein of right lower extremity    Also bilateral PE.  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 so was considered provoked   Hyperlipidemia     SURGICAL HISTORY: Past Surgical History:  Procedure Laterality Date   BIOPSY  03/15/2018   Procedure: BIOPSY;  Surgeon: Milus Banister, MD;  Location: [facility];  Service: Endoscopy;;   BIOPSY  03/01/2019   Procedure: BIOPSY;  Surgeon: Ladene Artist, MD;  Location: [facility];  Service: Endoscopy;;   BOWEL RESECTION N/A 08/25/2018   Procedure: LAPROSCOPIC LOOP ILEOSTOMY;  Surgeon: Greer Pickerel, MD;  Location: [name];  Service: General;  Laterality: N/A;   CLEFT PALATE REPAIR     COLECTOMY  07/30/2019   COLONOSCOPY N/A 03/15/2018   Procedure: COLONOSCOPY;  Surgeon: Milus Banister, MD;  Location: [facility];  Service: Endoscopy;  Laterality: N/A;   COLONOSCOPY N/A 08/20/2018   Procedure: COLONOSCOPY;  Surgeon:  Irene Shipper, MD;  Location: [facility];  Service: Endoscopy;  Laterality: N/A;   deviated septum repair     slight improvement   ESOPHAGOGASTRODUODENOSCOPY N/A 03/15/2018   Procedure: ESOPHAGOGASTRODUODENOSCOPY (EGD);  Surgeon: Milus Banister, MD;  Location: [name];  Service: Endoscopy;  Laterality: N/A;   FLEXIBLE SIGMOIDOSCOPY N/A 03/01/2019   Procedure: FLEXIBLE SIGMOIDOSCOPY;  Surgeon: Ladene Artist, MD;  Location: [facility];  Service: Endoscopy;  Laterality: N/A;   ILEOSCOPY N/A 03/01/2019   Procedure: ILEOSCOPY THROUGH STOMA;  Surgeon: Ladene Artist, MD;  Location: [facility];  Service: Endoscopy;  Laterality: N/A;   IR IMAGING GUIDED PORT INSERTION  03/17/2018   IR REMOVAL TUN ACCESS W/ PORT W/O FL MOD SED  09/22/2021   LAPAROSCOPY N/A 08/25/2018   Procedure: LAPAROSCOPY DIAGNOSTIC;  Surgeon: Greer Pickerel, MD;  Location: [facility];  Service: General;  Laterality: N/A;   TONSILLECTOMY      SOCIAL HISTORY: Social History   Socioeconomic History   Marital status: Married    Spouse name: lisa   Number of children: 0   Years of education: Not on file   Highest education level: Not on file  Occupational History   Not on file  Tobacco Use   Smoking status: Never   Smokeless tobacco: Never  Vaping Use   Vaping Use: Never used  Substance and Sexual Activity   Alcohol use: Yes    Comment: occasional   Drug use: No   Sexual  activity: Yes  Other Topics Concern   Not on file  Social History Narrative   Married 1985. No kids. 4 small dogs.      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, residential      Hobbies: work on cars, Haematologist, exercise as able   Social Determinants of Health   Financial Resource Strain: Low Risk  (05/22/2018)   Overall Financial Resource Strain (CARDIA)    Difficulty of Paying Living Expenses: Not hard at all  Food Insecurity: No Food Insecurity (05/22/2018)   Hunger Vital Sign    Worried About Running Out of Food in the Last  Year: Never true    [place] in the Last Year: Never true  Transportation Needs: No Transportation Needs (05/22/2018)   PRAPARE - Hydrologist (Medical): No    Lack of Transportation (Non-Medical): No  Physical Activity: Inactive (05/22/2018)   Exercise Vital Sign    Days of Exercise per Week: 0 days    Minutes of Exercise per Session: 0 min  Stress: No Stress Concern Present (05/22/2018)   [place]    Feeling of Stress : Not at all  Social Connections: Moderately Integrated (05/22/2018)   Social Connection and Isolation Panel [NHANES]    Frequency of Communication with Friends and Family: More than three times a week    Frequency of Social Gatherings with Friends and Family: More than three times a week    Attends Religious Services: 1 to 4 times per year    Active Member of [employer] or Organizations: No    Attends Archivist Meetings: Never    Marital Status: Married  Human resources officer Violence: Not At Risk (05/22/2018)   Humiliation, Afraid, Rape, and Kick questionnaire    Fear of Current or Ex-Partner: No    Emotionally Abused: No    Physically Abused: No    Sexually Abused: No    FAMILY HISTORY: Family History  Problem Relation Age of Onset   Lung cancer Mother        smoker   Brain cancer Mother        metastasis   AAA (abdominal aortic aneurysm) Father        smoker    ALLERGIES:  is allergic to ciprofloxacin.  MEDICATIONS:  Current Outpatient Medications  Medication Sig Dispense Refill   acetaminophen (TYLENOL) 325 MG tablet Take 2 tablets (650 mg total) by mouth every 6 (six) hours as needed for mild pain (or temp > 100).     B Complex Vitamins (VITAMIN B COMPLEX PO) Take 1 tablet by mouth daily.      Bismuth Subgallate (DEVROM) 200 MG CAPS Take 200 mg by mouth daily.     loperamide (IMODIUM) 2 MG capsule Take 2 mg by mouth as needed for diarrhea or loose stools.      Magnesium Oxide -Mg Supplement 400 MG CAPS Take 400 mg by mouth daily at 6 (six) AM.     No current facility-administered medications for this visit.    REVIEW OF SYSTEMS:    10 Point review of Systems was done is negative except as noted above.   PHYSICAL EXAMINATION: ECOG FS:2 - Symptomatic, <50% confined to bed  Vitals:   07/16/22 1340  BP: (!) 141/83  Pulse: 89  Resp: 16  Temp: 98.1 F (36.7 C)  SpO2: 98%   Wt Readings from Last 3 Encounters:  07/16/22 232 lb (105.2 kg)  04/01/22 222  lb 3.2 oz (100.8 kg)  01/12/22 226 lb 8 oz (102.7 kg)   Body mass index is 34.26 kg/m.   [name] NAD GENERAL:alert, in no acute distress and comfortable SKIN: no acute rashes, no significant lesions EYES: conjunctiva are pink and non-injected, sclera anicteric OROPHARYNX: MMM, no exudates, no oropharyngeal erythema or ulceration NECK: supple, no JVD LYMPH:  no palpable lymphadenopathy in the cervical, axillary or inguinal regions LUNGS: clear to auscultation b/l with normal respiratory effort HEART: regular rate & rhythm ABDOMEN:  normoactive bowel sounds , non tender, not distended. Extremity: no pedal edema PSYCH: alert & oriented x 3 with fluent speech NEURO: no focal motor/sensory deficits   LABORATORY DATA:      Latest Ref Rng & Units 07/16/2022    1:23 PM 01/12/2022   12:41 PM 10/06/2021    4:40 AM  CBC  WBC 4.0 - 10.5 K/uL 7.0  6.4  7.7   Hemoglobin 13.0 - 17.0 g/dL 14.4  14.7  13.6   Hematocrit 39.0 - 52.0 % 41.6  43.1  40.3   Platelets 150 - 400 K/uL 183  196  199         Latest Ref Rng & Units 07/16/2022    1:23 PM 01/12/2022   12:41 PM 10/06/2021    4:40 AM  CMP  Glucose 70 - 99 mg/dL 129  99  118   BUN 8 - 23 mg/dL 19  11  9    Creatinine 0.61 - 1.24 mg/dL 1.17  0.99  0.89   Sodium 135 - 145 mmol/L 140  139  135   Potassium 3.5 - 5.1 mmol/L 4.4  4.0  3.8   Chloride 98 - 111 mmol/L 108  107  105   CO2 22 - 32 mmol/L 28  26  23    Calcium 8.9 - 10.3 mg/dL 9.5   9.8  8.4   Total Protein 6.5 - 8.1 g/dL 7.2  7.5    Total Bilirubin 0.3 - 1.2 mg/dL 0.3  0.3    Alkaline Phos 38 - 126 U/L 85  68    AST 15 - 41 U/L 32  39    ALT 0 - 44 U/L 58  54     Lab Results  Component Value Date   LDH 120 07/16/2022    03/01/19 Colonoscopy biopsies:     03/17/18 Cytogenetics:   03/17/18 Colon Bx:   03/13/18 Peritoneum Biopsy:     07/02/2019 PORTABLE CHEST 1 VIEW (Accession [id number])    07/02/2019 CT ABDOMEN AND PELVIS WITH CONTRAST (Accession [id number])  RADIOGRAPHIC STUDIES:  I have personally reviewed the radiological images as listed and agreed with the findings in the report. No results found.  ASSESSMENT & PLAN:   65 y.o. male with   1.  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 03/10/18 CT A/P revealed Large irregular infiltrative solid mass in the right lower quadrant measuring up to 18.7 x 18.5 x 17.8 cm, infiltrating and encasing multiple distal small bowel loops and likely the ileocecal region, partially encasing the sigmoid colon, with prominent extension into the right lower retroperitoneum and extraperitoneal right pelvis with encasement of right external iliac and proximal right common iliac vasculature and infiltration of the right iliopsoas muscle. No BM or CNS involvement.    04/18/18 PET/CT revealed Continued improvement in right colonic wall thickening and surrounding bulky masses consistent with treated lymphoma. No hypermetabolic activity to suggest residual tumor.  There is some hypermetabolic activity associated with the sigmoid colon which demonstrates mild wall thickening, surrounding inflammation and underlying diverticulosis, suggesting mild diverticulitis. Suspected treatment related changes throughout the bone marrow. There is mildly increased activity within the clivus without clear corresponding finding on the CT images. Attention on  follow-up recommended. Nonobstructing right renal calculi. 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    06/09/18 PET/CT revealed Soft tissue lesion within the ventral pelvis is again identified demonstrating mild to moderate increased uptake within SUV max of 5.61. Deauville criteria 4. Concerning for residual metabolically active tumor. 2. Similar appearance of diffusely thickened appendix within the right lower quadrant of the abdomen. Findings may reflect treatment related changes. 3. Increased radiotracer uptake throughout the bone marrow which is favored to represent treatment related changes. 4. Lad coronary artery atherosclerotic calcifications.   10/05/18 CT A/P revealed Persistent wall thickening in the sigmoid colon although a component of this could be due to diverticulosis. There is also a suggestion of wall thickening and potential mucosal enhancement in the terminal ileum, as well as adjacent indistinct stranding in the mesenteric and omental adipose tissues roughly similar to the prior exam. Some of this may be treatment related. Inflammatory process such as Crohn's disease not excluded. 2. No current adenopathy. 3. Other imaging findings of potential clinical significance: Nonobstructive right nephrolithiasis. Aortic Atherosclerosis. Moderate prostatomegaly. Left foraminal impingement at L4-5.  S/p IMRT with 36 Gy over 20 fractions, between 11/09/18 and 12/06/18 with Dr. Isidore Moos  05/24/2019 CT CAP revealed "1. Overall little interval change from CT 05/09/2019. Persistent inflamed loop of distal sigmoid colon with submucosal edema. Potential fistulous communication versus tethering to the ascending colon and a loop of small bowel. Findings are suggestive of inflammatory bowel disease. 2. No evidence of bowel obstruction. Oral contrast enters the ileostomy bag with bowel obstruction or small bowel dilatation."   2. H/o RLE DVT and b/l PE now off anticoagulation   3.  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  CT scan - sigmoid obstructive lesion ? Scar tissue from resolved lymphoma vs residual lymphoma vs post inflammatory scarring from diverticulitis/limited perforation. Also has SBO from adhesions. Colonoscopy 08/21/2018 - no intramucosal lesions. Extrinsic compression of sigmoid colon. S/p diverting ileostomy on 08/25/18 with Dr. Greer Pickerel  4.  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  GI panel and C. difficile negative.  Now s/p colectomy  --surgical pathology with no residual lymphoma.   Plan Labs from today were discussed in detail with the patient and his accompanying wife. -CBC within normal limits CMP stable LDH within Normal Limits 120. Patient has no new symptoms or signs or lab findings concerning for lymphoma recurrence/progression at this time. No indication for additional hematologic work-up or treatment at this time. Recommended staying up to speed with vaccinations including flu shot, RSV and COVID vaccination. FOLLOW UP: Return to clinic with [name] with labs in 6 months  The total time spent in the appointment was 20 minutes*.  All of the patient's questions were answered with apparent satisfaction. The patient knows to call the clinic with any problems, questions or concerns.   Sullivan Lone MD MS AAHIVMS [facility] Hematology/Oncology Physician [facility]  .*Total Encounter Time as defined by the Centers for Medicare and Medicaid Services includes, in addition to the face-to-face time of a patient visit (documented in the note above) non-face-to-face time: obtaining and reviewing outside history, ordering and reviewing medications, tests or procedures,  care coordination (communications with other health care professionals or caregivers) and documentation in the medical record.

## 2022-08-26 ENCOUNTER — Encounter: Payer: Self-pay | Admitting: *Deleted

## 2022-09-17 ENCOUNTER — Telehealth: Payer: Self-pay | Admitting: Family Medicine

## 2022-09-17 NOTE — Telephone Encounter (Signed)
Copied from [place] [id number]. Topic: Medicare AWV >> Sep 17, 2022 10:42 AM Gillis Santa wrote: Reason for CRM: LVM PT CALL [place]

## 2022-09-30 ENCOUNTER — Ambulatory Visit (INDEPENDENT_AMBULATORY_CARE_PROVIDER_SITE_OTHER): Payer: Medicare Other

## 2022-09-30 VITALS — Wt 232.0 lb

## 2022-09-30 DIAGNOSIS — Z0000 Encounter for general adult medical examination without abnormal findings: Secondary | ICD-10-CM | POA: Diagnosis not present

## 2022-09-30 NOTE — Patient Instructions (Signed)
[name] , Thank you for taking time to come for your Medicare Wellness Visit. I appreciate your ongoing commitment to your health goals. Please review the following plan we discussed and let me know if I can assist you in the future.   These are the goals we discussed:  Goals      Patient Stated     Maintain health         This is a list of the screening recommended for you and due dates:  Health Maintenance  Topic Date Due   Eye exam for diabetics  03/23/2016   COVID-19 Vaccine (6 - 2023-24 season) 05/14/2022   Flu Shot  12/12/2022*   Pneumonia Vaccine (1 - PCV) 04/02/2023*   Hepatitis C Screening: USPSTF Recommendation to screen - Ages 18-79 yo.  10/11/2109*   Hemoglobin A1C  10/02/2022   Yearly kidney health urinalysis for diabetes  04/02/2023   Complete foot exam   04/02/2023   Yearly kidney function blood test for diabetes  07/17/2023   Medicare Annual Wellness Visit  10/01/2023   Colon Cancer Screening  02/28/2029   Zoster (Shingles) Vaccine  Completed   HPV Vaccine  Aged Out   DTaP/Tdap/Td vaccine  Discontinued  *Topic was postponed. The date shown is not the original due date.    Advanced directives: Please bring a copy of your health care power of attorney and living will to the office at your convenience.  Conditions/risks identified: maintain health   Next appointment: Follow up in one year for your annual wellness visit.   Preventive Care 48 Years and Older, Male  Preventive care refers to lifestyle choices and visits with your health care provider that can promote health and wellness. What does preventive care include? A yearly physical exam. This is also called an annual well check. Dental exams once or twice a year. Routine eye exams. Ask your health care provider how often you should have your eyes checked. Personal lifestyle choices, including: Daily care of your teeth and gums. Regular physical activity. Eating a healthy diet. Avoiding tobacco  and drug use. Limiting alcohol use. Practicing safe sex. Taking low doses of aspirin every day. Taking vitamin and mineral supplements as recommended by your health care provider. What happens during an annual well check? The services and screenings done by your health care provider during your annual well check will depend on your age, overall health, lifestyle risk factors, and family history of disease. Counseling  Your health care provider may ask you questions about your: Alcohol use. Tobacco use. Drug use. Emotional well-being. Home and relationship well-being. Sexual activity. Eating habits. History of falls. Memory and ability to understand (cognition). Work and work Statistician. Screening  You may have the following tests or measurements: Height, weight, and BMI. Blood pressure. Lipid and cholesterol levels. These may be checked every 5 years, or more frequently if you are over 12 years old. Skin check. Lung cancer screening. You may have this screening every year starting at age 65 if you have a 30-pack-year history of smoking and currently smoke or have quit within the past 15 years. Fecal occult blood test (FOBT) of the stool. You may have this test every year starting at age 55. Flexible sigmoidoscopy or colonoscopy. You may have a sigmoidoscopy every 5 years or a colonoscopy every 10 years starting at age 67. Prostate cancer screening. Recommendations will vary depending on your family history and other risks. Hepatitis C blood test. Hepatitis B blood test. Sexually transmitted  disease (STD) testing. Diabetes screening. This is done by checking your blood sugar (glucose) after you have not eaten for a while (fasting). You may have this done every 1-3 years. Abdominal aortic aneurysm (AAA) screening. You may need this if you are a current or former smoker. Osteoporosis. You may be screened starting at age 41 if you are at high risk. Talk with your health care provider  about your test results, treatment options, and if necessary, the need for more tests. Vaccines  Your health care provider may recommend certain vaccines, such as: Influenza vaccine. This is recommended every year. Tetanus, diphtheria, and acellular pertussis (Tdap, Td) vaccine. You may need a Td booster every 10 years. Zoster vaccine. You may need this after age 61. Pneumococcal 13-valent conjugate (PCV13) vaccine. One dose is recommended after age 71. Pneumococcal polysaccharide (PPSV23) vaccine. One dose is recommended after age 14. Talk to your health care provider about which screenings and vaccines you need and how often you need them. This information is not intended to replace advice given to you by your health care provider. Make sure you discuss any questions you have with your health care provider. Document Released: 09/26/2015 Document Revised: 05/19/2016 Document Reviewed: 07/01/2015 Elsevier Interactive Patient Education  2017 [place] Prevention in the Home Falls can cause injuries. They can happen to people of all ages. There are many things you can do to make your home safe and to help prevent falls. What can I do on the outside of my home? Regularly fix the edges of walkways and driveways and fix any cracks. Remove anything that might make you trip as you walk through a door, such as a raised step or threshold. Trim any bushes or trees on the path to your home. Use bright outdoor lighting. Clear any walking paths of anything that might make someone trip, such as rocks or tools. Regularly check to see if handrails are loose or broken. Make sure that both sides of any steps have handrails. Any raised decks and porches should have guardrails on the edges. Have any leaves, snow, or ice cleared regularly. Use sand or salt on walking paths during winter. Clean up any spills in your garage right away. This includes oil or grease spills. What can I do in the  bathroom? Use night lights. Install grab bars by the toilet and in the tub and shower. Do not use towel bars as grab bars. Use non-skid mats or decals in the tub or shower. If you need to sit down in the shower, use a plastic, non-slip stool. Keep the floor dry. Clean up any water that spills on the floor as soon as it happens. Remove soap buildup in the tub or shower regularly. Attach bath mats securely with double-sided non-slip rug tape. Do not have throw rugs and other things on the floor that can make you trip. What can I do in the bedroom? Use night lights. Make sure that you have a light by your bed that is easy to reach. Do not use any sheets or blankets that are too big for your bed. They should not hang down onto the floor. Have a firm chair that has side arms. You can use this for support while you get dressed. Do not have throw rugs and other things on the floor that can make you trip. What can I do in the kitchen? Clean up any spills right away. Avoid walking on wet floors. Keep items that you use a  lot in easy-to-reach places. If you need to reach something above you, use a strong step stool that has a grab bar. Keep electrical cords out of the way. Do not use floor polish or wax that makes floors slippery. If you must use wax, use non-skid floor wax. Do not have throw rugs and other things on the floor that can make you trip. What can I do with my stairs? Do not leave any items on the stairs. Make sure that there are handrails on both sides of the stairs and use them. Fix handrails that are broken or loose. Make sure that handrails are as long as the stairways. Check any carpeting to make sure that it is firmly attached to the stairs. Fix any carpet that is loose or worn. Avoid having throw rugs at the top or bottom of the stairs. If you do have throw rugs, attach them to the floor with carpet tape. Make sure that you have a light switch at the top of the stairs and the  bottom of the stairs. If you do not have them, ask someone to add them for you. What else can I do to help prevent falls? Wear shoes that: Do not have high heels. Have rubber bottoms. Are comfortable and fit you well. Are closed at the toe. Do not wear sandals. If you use a stepladder: Make sure that it is fully opened. Do not climb a closed stepladder. Make sure that both sides of the stepladder are locked into place. Ask someone to hold it for you, if possible. Clearly mark and make sure that you can see: Any grab bars or handrails. First and last steps. Where the edge of each step is. Use tools that help you move around (mobility aids) if they are needed. These include: Canes. Walkers. Scooters. Crutches. Turn on the lights when you go into a dark area. Replace any light bulbs as soon as they burn out. Set up your furniture so you have a clear path. Avoid moving your furniture around. If any of your floors are uneven, fix them. If there are any pets around you, be aware of where they are. Review your medicines with your doctor. Some medicines can make you feel dizzy. This can increase your chance of falling. Ask your doctor what other things that you can do to help prevent falls. This information is not intended to replace advice given to you by your health care provider. Make sure you discuss any questions you have with your health care provider. Document Released: 06/26/2009 Document Revised: 02/05/2016 Document Reviewed: 10/04/2014 Elsevier Interactive Patient Education  2017 [employer].

## 2022-09-30 NOTE — Progress Notes (Addendum)
I connected with  [name] on 09/30/22 by a audio enabled telemedicine application and verified that I am speaking with the correct person using two identifiers.  Patient Location: Home  Provider Location: Office/Clinic  I discussed the limitations of evaluation and management by telemedicine. The patient expressed understanding and agreed to proceed.  Subjective:   Mark Frey is a 67 y.o. male who presents for an Initial Medicare Annual Wellness Visit.  Review of Systems     Cardiac Risk Factors include: advanced age (>51mn, >>76women);male gender;dyslipidemia;hypertension;obesity (BMI >30kg/m2)     Objective:    Today's Vitals   09/30/22 1432  Weight: 232 lb (105.2 kg)   Body mass index is 34.26 kg/m.     09/30/2022    2:37 PM 10/03/2021    7:11 AM 09/22/2021   12:05 PM 05/09/2020    1:40 PM 01/06/2020    8:49 PM 12/03/2019   11:25 AM 10/11/2019    3:17 PM  Advanced Directives  Does Patient Have a Medical Advance Directive? Yes No No No No No No  [place]        Copy of [place]? No - copy requested        Would patient like information on creating a medical advance directive?  No - Patient declined No - Patient declined No - Patient declined No - Patient declined No - Patient declined No - Patient declined    Current Medications (verified) Outpatient Encounter Medications as of 09/30/2022  Medication Sig   acetaminophen (TYLENOL) 325 MG tablet Take 2 tablets (650 mg total) by mouth every 6 (six) hours as needed for mild pain (or temp > 100).   B Complex Vitamins (VITAMIN B COMPLEX PO) Take 1 tablet by mouth daily.    Bismuth Subgallate (DEVROM) 200 MG CAPS Take 200 mg by mouth daily.   loperamide (IMODIUM) 2 MG capsule Take 2 mg by mouth as needed for diarrhea or loose stools.   Magnesium Oxide -Mg Supplement 400 MG CAPS Take 400 mg by mouth daily at 6 (six) AM.   No  facility-administered encounter medications on file as of 09/30/2022.    Allergies (verified) Ciprofloxacin   History: Past Medical History:  Diagnosis Date   ALLERGIC RHINITIS    [place]    Lymphoma    Diabetes mellitus    History of deep venous thrombosis (DVT) of distal vein of right lower extremity    Also bilateral PE.  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 so was considered provoked   Hyperlipidemia    Past Surgical History:  Procedure Laterality Date   BIOPSY  03/15/2018   Procedure: BIOPSY;  Surgeon: JMilus Banister MD;  Location: [facility];  Service: Endoscopy;;   BIOPSY  03/01/2019   Procedure: BIOPSY;  Surgeon: SLadene Artist MD;  [facility]: [facility];  Service: Endoscopy;;   BOWEL RESECTION N/A 08/25/2018   Procedure: LAPROSCOPIC LOOP ILEOSTOMY;  Surgeon: WGreer Pickerel MD;  [facility];  Service: General;  Laterality: N/A;   CLEFT PALATE REPAIR     COLECTOMY  07/30/2019   COLONOSCOPY N/A 03/15/2018   Procedure: COLONOSCOPY;  Surgeon: JMilus Banister MD;  [facility]: [facility];  Service: Endoscopy;  Laterality: N/A;   COLONOSCOPY N/A 08/20/2018   Procedure: COLONOSCOPY;  Surgeon: PIrene Shipper MD;  [facility]: [facility];  Service: Endoscopy;  Laterality: N/A;  deviated septum repair     slight improvement   ESOPHAGOGASTRODUODENOSCOPY N/A 03/15/2018   Procedure: ESOPHAGOGASTRODUODENOSCOPY (EGD);  Surgeon: Milus Banister, MD;  [name];  Service: Endoscopy;  Laterality: N/A;   FLEXIBLE SIGMOIDOSCOPY N/A 03/01/2019   Procedure: FLEXIBLE SIGMOIDOSCOPY;  Surgeon: Ladene Artist, MD;  Location: [facility];  Service: Endoscopy;  Laterality: N/A;   ILEOSCOPY N/A 03/01/2019   Procedure: ILEOSCOPY THROUGH STOMA;  Surgeon: Ladene Artist, MD;  Location: [facility];  Service: Endoscopy;  Laterality: N/A;   IR IMAGING GUIDED PORT INSERTION  03/17/2018   IR REMOVAL TUN ACCESS W/ PORT W/O FL MOD SED  09/22/2021    LAPAROSCOPY N/A 08/25/2018   Procedure: LAPAROSCOPY DIAGNOSTIC;  Surgeon: Greer Pickerel, MD;  [facility]: [facility];  Service: General;  Laterality: N/A;   TONSILLECTOMY     Family History  Problem Relation Age of Onset   Lung cancer Mother        smoker   Brain cancer Mother        metastasis   AAA (abdominal aortic aneurysm) Father        smoker   Social History   Socioeconomic History   Marital status: Married    Spouse name: lisa   Number of children: 0   Years of education: Not on file   Highest education level: Not on file  Occupational History   Not on file  Tobacco Use   Smoking status: Never   Smokeless tobacco: Never  Vaping Use   Vaping Use: Never used  Substance and Sexual Activity   Alcohol use: Not Currently    Comment: occasional   Drug use: No   Sexual activity: Yes  Other Topics Concern   Not on file  Social History Narrative   Married 1985. No kids. 4 small dogs.      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, residential      Hobbies: work on cars, Haematologist, exercise as able   Social Determinants of Health   Financial Resource Strain: Low Risk  (09/30/2022)   Overall Financial Resource Strain (CARDIA)    Difficulty of Paying Living Expenses: Not hard at all  Food Insecurity: No Food Insecurity (09/30/2022)   Hunger Vital Sign    Worried About Running Out of Food in the Last Year: Never true    [place] in the Last Year: Never true  Transportation Needs: No Transportation Needs (09/30/2022)   PRAPARE - Hydrologist (Medical): No    Lack of Transportation (Non-Medical): No  Physical Activity: Inactive (09/30/2022)   Exercise Vital Sign    Days of Exercise per Week: 0 days    Minutes of Exercise per Session: 0 min  Stress: No Stress Concern Present (09/30/2022)   [place]    Feeling of Stress : Not at all  Social Connections: Moderately  Isolated (09/30/2022)   Social Connection and Isolation Panel [NHANES]    Frequency of Communication with Friends and Family: More than three times a week    Frequency of Social Gatherings with Friends and Family: More than three times a week    Attends Religious Services: Never    Marine scientist or Organizations: No    Attends Archivist Meetings: Never    Marital Status: Married    Tobacco Counseling Counseling given: Not Answered   Clinical Intake:  Pre-visit preparation completed: Yes  Pain : No/denies pain  BMI - recorded: 34.26 Nutritional Status: BMI > 30  Obese Nutritional Risks: None Diabetes: Yes CBG done?: No Did pt. bring in CBG monitor from home?: No  How often do you need to have someone help you when you read instructions, pamphlets, or other written materials from your doctor or pharmacy?: 1 - Never  Diabetic?Nutrition Risk Assessment:  Has the patient had any N/V/D within the last 2 months?  No  Does the patient have any non-healing wounds?  No  Has the patient had any unintentional weight loss or weight gain?  No   Diabetes:  Is the patient diabetic?  Yes  If diabetic, was a CBG obtained today?  No  Did the patient bring in their glucometer from home?  No  How often do you monitor your CBG's? N/A.   Financial Strains and Diabetes Management:  Are you having any financial strains with the device, your supplies or your medication? No .  Does the patient want to be seen by Chronic Care Management for management of their diabetes?  No  Would the patient like to be referred to a Nutritionist or for Diabetic Management?  No   Diabetic Exams:  Diabetic Eye Exam: Overdue for diabetic eye exam. Pt has been advised about the importance in completing this exam. Patient advised to call and schedule an eye exam. Diabetic Foot Exam: Completed 04/01/22  Interpreter Needed?: No  Information entered by :: Charlott Rakes, LPN   Activities  of Daily Living    09/30/2022    2:38 PM 10/03/2021   11:00 PM  In your present state of health, do you have any difficulty performing the following activities:  Hearing? 0 0  Vision? 0 0  Difficulty concentrating or making decisions? 0 0  Walking or climbing stairs? 0 0  Dressing or bathing? 0 0  Doing errands, shopping? 0 0  Preparing Food and eating ? N   Using the Toilet? N   In the past six months, have you accidently leaked urine? N   Do you have problems with loss of bowel control? N   Managing your Medications? N   Managing your Finances? N   Housekeeping or managing your Housekeeping? N     Patient Care Team: [name], MD as PCP - General (Family Medicine)  Indicate any recent Medical Services you may have received from other than [facility] providers in the past year (date may be approximate).     Assessment:   This is a routine wellness examination for [employer].  Hearing/Vision screen Hearing Screening - Comments:: Pt denies any hearing issues  Vision Screening - Comments:: Pt follows up with [facility] for annual eye exams   Dietary issues and exercise activities discussed: Current Exercise Habits: The patient does not participate in regular exercise at present   Goals Addressed             This Visit's Progress    Patient Stated       Maintain health        Depression Screen    09/30/2022    2:36 PM 04/01/2022   11:00 AM 06/23/2020    2:39 PM 02/01/2020   11:25 AM 10/09/2019    1:40 PM 07/24/2019    2:18 PM 04/17/2019    4:47 PM  PHQ 2/9 Scores  PHQ - 2 Score 0 0 0 0 0 0 4  PHQ- 9 Score  0 0 0 0 0 5    Fall Risk  09/30/2022    2:38 PM 04/01/2022   10:35 AM 05/09/2017   10:10 AM  Fall Risk   Falls in the past year? 0 0 No  Number falls in past yr: 0 0   Injury with Fall? 0 0   Risk for fall due to : Impaired vision No Fall Risks   Follow up Falls prevention discussed Falls evaluation completed     FALL RISK PREVENTION PERTAINING TO THE  HOME:  Any stairs in or around the home? No  If so, are there any without handrails? No  Home free of loose throw rugs in walkways, pet beds, electrical cords, etc? Yes  Adequate lighting in your home to reduce risk of falls? Yes   ASSISTIVE DEVICES UTILIZED TO PREVENT FALLS:  Life alert? No  Use of a cane, walker or w/c? No  Grab bars in the bathroom? No  Shower chair or bench in shower? No  Elevated toilet seat or a handicapped toilet? No   TIMED UP AND GO:  Was the test performed? No .    Cognitive Function:        Immunizations Immunization History  Administered Date(s) Administered   PFIZER(Purple Top)SARS-COV-2 Vaccination 12/12/2019, 01/02/2020, 07/28/2020, 02/19/2021, 07/20/2021   Td 09/17/1998   Zoster Recombinat (Shingrix) 12/28/2017, 02/01/2020      Flu Vaccine status: Declined, Education has been provided regarding the importance of this vaccine but patient still declined. Advised may receive this vaccine at local pharmacy or Health Dept. Aware to provide a copy of the vaccination record if obtained from local pharmacy or Health Dept. Verbalized acceptance and understanding.  Pneumococcal vaccine status: Declined,  Education has been provided regarding the importance of this vaccine but patient still declined. Advised may receive this vaccine at local pharmacy or Health Dept. Aware to provide a copy of the vaccination record if obtained from local pharmacy or Health Dept. Verbalized acceptance and understanding.   Covid-19 vaccine status: Completed vaccines  Qualifies for Shingles Vaccine? Yes   Zostavax completed Yes   Shingrix Completed?: Yes  Screening Tests Health Maintenance  Topic Date Due   OPHTHALMOLOGY EXAM  03/23/2016   COVID-19 Vaccine (6 - 2023-24 season) 05/14/2022   INFLUENZA VACCINE  12/12/2022 (Originally 04/13/2022)   Pneumonia Vaccine 55+ Years old (1 - PCV) 04/02/2023 (Originally 07/15/1962)   Hepatitis C Screening  10/11/2109  (Originally 07/15/1974)   HEMOGLOBIN A1C  10/02/2022   Diabetic kidney evaluation - Urine ACR  04/02/2023   FOOT EXAM  04/02/2023   Diabetic kidney evaluation - eGFR measurement  07/17/2023   Medicare Annual Wellness (AWV)  10/01/2023   COLONOSCOPY (Pts 45-65yr Insurance coverage will need to be confirmed)  02/28/2029   Zoster Vaccines- Shingrix  Completed   HPV VACCINES  Aged Out   DTaP/Tdap/Td  Discontinued    Health Maintenance  Health Maintenance Due  Topic Date Due   OPHTHALMOLOGY EXAM  03/23/2016   COVID-19 Vaccine (6 - 2023-24 season) 05/14/2022    Colorectal cancer screening: Type of screening: Colonoscopy. Completed 03/01/19. Repeat every 10 years  Additional Screening:  Hepatitis C Screening: does qualify;  Vision Screening: Recommended annual ophthalmology exams for early detection of glaucoma and other disorders of the eye. Is the patient up to date with their annual eye exam?  No  Who is the provider or what is the name of the office in which the patient attends annual eye exams? [facility]  If pt is not established with a provider, would they like to  be referred to a provider to establish care? No .   Dental Screening: Recommended annual dental exams for proper oral hygiene  Community Resource Referral / Chronic Care Management: CRR required this visit?  No   CCM required this visit?  No      Plan:     I have personally reviewed and noted the following in the patient's chart:   Medical and social history Use of alcohol, tobacco or illicit drugs  Current medications and supplements including opioid prescriptions. Patient is not currently taking opioid prescriptions. Functional ability and status Nutritional status Physical activity Advanced directives List of other physicians Hospitalizations, surgeries, and ER visits in previous 12 months Vitals Screenings to include cognitive, depression, and falls Referrals and appointments  In addition, I  have reviewed and discussed with patient certain preventive protocols, quality metrics, and best practice recommendations. A written personalized care plan for preventive services as well as general preventive health recommendations were provided to patient.     Willette Brace, LPN   [id number]   Nurse Notes: none

## 2022-10-01 NOTE — Progress Notes (Signed)
I connected with  Mark Frey on 10/01/22 by a audio enabled telemedicine application and verified that I am speaking with the correct person using two identifiers.along with wife Mark Frey   Patient Location: Home  Provider Location: Office/Clinic  I discussed the limitations of evaluation and management by telemedicine. The patient expressed understanding and agreed to proceed.  Subjective:   Mark Frey is a 67 y.o. male who presents for an Initial Medicare Annual Wellness Visit.  Review of Systems     Cardiac Risk Factors include: advanced age (>30mn, >>44women);male gender;dyslipidemia;hypertension;obesity (BMI >30kg/m2)     Objective:    Today's Vitals   09/30/22 1432  Weight: 232 lb (105.2 kg)   Body mass index is 34.26 kg/m.     09/30/2022    2:37 PM 10/03/2021    7:11 AM 09/22/2021   12:05 PM 05/09/2020    1:40 PM 01/06/2020    8:49 PM 12/03/2019   11:25 AM 10/11/2019    3:17 PM  Advanced Directives  Does Patient Have a Medical Advance Directive? Yes No No No No No No  Type of AP[place]amed[place]of [place] will        [place] of [place]? No - copy requested        Would patient like information on creating a medical advance directive?  No - Patient declined No - Patient declined No - Patient declined No - Patient declined No - Patient declined No - Patient declined    Current Medications (verified) Outpatient Encounter Medications as of 09/30/2022  Medication Sig   acetaminophen (TYLENOL) 325 MG tablet Take 2 tablets (650 mg total) by mouth every 6 (six) hours as needed for mild pain (or temp > 100).   B Complex Vitamins (VITAMIN B COMPLEX PO) Take 1 tablet by mouth daily.    Bismuth Subgallate (DEVROM) 200 MG CAPS Take 200 mg by mouth daily.   loperamide (IMODIUM) 2 MG capsule Take 2 mg by mouth as needed for diarrhea or loose stools.   Magnesium Oxide -Mg Supplement 400 MG CAPS Take 400 mg by mouth daily at  6 (six) AM.   No facility-administered encounter medications on file as of 09/30/2022.    Allergies (verified) Ciprofloxacin   History: Past Medical History:  Diagnosis Date   [place]IS    [place]    Lymphoma    Diabetes mellitus    History of deep venous thrombosis (DVT) of distal vein of right lower extremity    Also bilateral PE.  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 so was considered provoked   Hyperlipidemia    Past Surgical History:  Procedure Laterality Date   BIOPSY  03/15/2018   Procedure: BIOPSY;  Surgeon: JMilus Banister MD;  [facility];  Service: Endoscopy;;   BIOPSY  03/01/2019   Procedure: BIOPSY;  Surgeon: SLadene Artist MD;  [facility];  Service: Endoscopy;;   BOWEL RESECTION N/A 08/25/2018   Procedure: LAPROSCOPIC LOOP ILEOSTOMY;  Surgeon: WGreer Pickerel MD;  [facility];  Service: General;  Laterality: N/A;   CLEFT PALATE REPAIR     COLECTOMY  07/30/2019   COLONOSCOPY N/A 03/15/2018   Procedure: COLONOSCOPY;  Surgeon: JMilus Banister MD;  [facility];  Service: Endoscopy;  Laterality: N/A;   COLONOSCOPY N/A 08/20/2018   Procedure: COLONOSCOPY;  Surgeon: PIrene Shipper MD;  [facility];  Service: Endoscopy;  Laterality: N/A;   deviated septum repair     slight improvement   ESOPHAGOGASTRODUODENOSCOPY N/A 03/15/2018   Procedure: ESOPHAGOGASTRODUODENOSCOPY (EGD);  Surgeon: Milus Banister, MD;  [name];  Service: Endoscopy;  Laterality: N/A;   FLEXIBLE SIGMOIDOSCOPY N/A 03/01/2019   Procedure: FLEXIBLE SIGMOIDOSCOPY;  Surgeon: Ladene Artist, MD;  [facility];  Service: Endoscopy;  Laterality: N/A;   ILEOSCOPY N/A 03/01/2019   Procedure: ILEOSCOPY THROUGH STOMA;  Surgeon: Ladene Artist, MD;  [facility];  Service: Endoscopy;  Laterality: N/A;   IR IMAGING GUIDED PORT INSERTION  03/17/2018   IR REMOVAL TUN ACCESS W/ PORT W/O FL  MOD SED  09/22/2021   LAPAROSCOPY N/A 08/25/2018   Procedure: LAPAROSCOPY DIAGNOSTIC;  Surgeon: Greer Pickerel, MD;  [facility];  Service: General;  Laterality: N/A;   TONSILLECTOMY     Family History  Problem Relation Age of Onset   Lung cancer Mother        smoker   Brain cancer Mother        metastasis   AAA (abdominal aortic aneurysm) Father        smoker   Social History   Socioeconomic History   Marital status: Married    Spouse name: lisa   Number of children: 0   Years of education: Not on file   Highest education level: Not on file  Occupational History   Not on file  Tobacco Use   Smoking status: Never   Smokeless tobacco: Never  Vaping Use   Vaping Use: Never used  Substance and Sexual Activity   Alcohol use: Not Currently    Comment: occasional   Drug use: No   Sexual activity: Yes  Other Topics Concern   Not on file  Social History Narrative   Married 1985. No kids. 4 small dogs.      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, residential      Hobbies: work on cars, Haematologist, exercise as able   Social Determinants of Health   Financial Resource Strain: Low Risk  (09/30/2022)   Overall Financial Resource Strain (CARDIA)    Difficulty of Paying Living Expenses: Not hard at all  Food Insecurity: No Food Insecurity (09/30/2022)   Hunger Vital Sign    Worried About Running Out of Food in the Last Year: Never true    [place] in the Last Year: Never true  Transportation Needs: No Transportation Needs (09/30/2022)   PRAPARE - Hydrologist (Medical): No    Lack of Transportation (Non-Medical): No  Physical Activity: Inactive (09/30/2022)   Exercise Vital Sign    Days of Exercise per Week: 0 days    Minutes of Exercise per Session: 0 min  Stress: No Stress Concern Present (09/30/2022)   [place]    Feeling of Stress : Not at all  Social  Connections: Moderately Isolated (09/30/2022)   Social Connection and Isolation Panel [NHANES]    Frequency of Communication with Friends and Family: More than three times a week    Frequency of Social Gatherings with Friends and Family: More than three times a week    Attends Religious Services: Never    Marine scientist or Organizations: No    Attends Archivist Meetings: Never    Marital Status: Married    Tobacco Counseling Counseling given: Not Answered   Clinical Intake:  Pre-visit preparation completed: Yes  Pain :  No/denies pain     BMI - recorded: 34.26 Nutritional Status: BMI > 30  Obese Nutritional Risks: None Diabetes: Yes CBG done?: No Did pt. bring in CBG monitor from home?: No  How often do you need to have someone help you when you read instructions, pamphlets, or other written materials from your doctor or pharmacy?: 1 - Never  Diabetic?Nutrition Risk Assessment:  Has the patient had any N/V/D within the last 2 months?  No  Does the patient have any non-healing wounds?  No  Has the patient had any unintentional weight loss or weight gain?  No   Diabetes:  Is the patient diabetic?  Yes  If diabetic, was a CBG obtained today?  No  Did the patient bring in their glucometer from home?  No  How often do you monitor your CBG's? N/A.   Financial Strains and Diabetes Management:  Are you having any financial strains with the device, your supplies or your medication? No .  Does the patient want to be seen by Chronic Care Management for management of their diabetes?  No  Would the patient like to be referred to a Nutritionist or for Diabetic Management?  No   Diabetic Exams:  Diabetic Eye Exam: Overdue for diabetic eye exam. Pt has been advised about the importance in completing this exam. Patient advised to call and schedule an eye exam. Diabetic Foot Exam: Completed 04/01/22  Interpreter Needed?: No  Information entered by :: Charlott Rakes, LPN   Activities of Daily Living    09/30/2022    2:38 PM 10/03/2021   11:00 PM  In your present state of health, do you have any difficulty performing the following activities:  Hearing? 0 0  Vision? 0 0  Difficulty concentrating or making decisions? 0 0  Walking or climbing stairs? 0 0  Dressing or bathing? 0 0  Doing errands, shopping? 0 0  Preparing Food and eating ? N   Using the Toilet? N   In the past six months, have you accidently leaked urine? N   Do you have problems with loss of bowel control? N   Managing your Medications? N   Managing your Finances? N   Housekeeping or managing your Housekeeping? N     Patient Care Team: [name], MD as PCP - General (Family Medicine)  Indicate any recent Medical Services you may have received from other than [facility] providers in the past year (date may be approximate).     Assessment:   This is a routine wellness examination for [employer].  Hearing/Vision screen Hearing Screening - Comments:: Pt denies any hearing issues  Vision Screening - Comments:: Pt follows up with [facility] for annual eye exams   Dietary issues and exercise activities discussed: Current Exercise Habits: The patient does not participate in regular exercise at present   Goals Addressed             This Visit's Progress    Patient Stated       Maintain health       Depression Screen    09/30/2022    2:36 PM 04/01/2022   11:00 AM 06/23/2020    2:39 PM 02/01/2020   11:25 AM 10/09/2019    1:40 PM 07/24/2019    2:18 PM 04/17/2019    4:47 PM  PHQ 2/9 Scores  PHQ - 2 Score 0 0 0 0 0 0 4  PHQ- 9 Score  0 0 0 0 0 5  Fall Risk    09/30/2022    2:38 PM 04/01/2022   10:35 AM 05/09/2017   10:10 AM  Fall Risk   Falls in the past year? 0 0 No  Number falls in past yr: 0 0   Injury with Fall? 0 0   Risk for fall due to : Impaired vision No Fall Risks   Follow up Falls prevention discussed Falls evaluation completed     FALL RISK  PREVENTION PERTAINING TO THE HOME:  Any stairs in or around the home? No  If so, are there any without handrails? No  Home free of loose throw rugs in walkways, pet beds, electrical cords, etc? Yes  Adequate lighting in your home to reduce risk of falls? Yes   ASSISTIVE DEVICES UTILIZED TO PREVENT FALLS:  Life alert? No  Use of a cane, walker or w/c? No  Grab bars in the bathroom? No  Shower chair or bench in shower? No  Elevated toilet seat or a handicapped toilet? No   TIMED UP AND GO:  Was the test performed? No .    Cognitive Function: Pt declined         Immunizations Immunization History  Administered Date(s) Administered   PFIZER(Purple Top)SARS-COV-2 Vaccination 12/12/2019, 01/02/2020, 07/28/2020, 02/19/2021, 07/20/2021   Td 09/17/1998   Zoster Recombinat (Shingrix) 12/28/2017, 02/01/2020      Flu Vaccine status: Declined, Education has been provided regarding the importance of this vaccine but patient still declined. Advised may receive this vaccine at local pharmacy or Health Dept. Aware to provide a copy of the vaccination record if obtained from local pharmacy or Health Dept. Verbalized acceptance and understanding.  Pneumococcal vaccine status: Declined,  Education has been provided regarding the importance of this vaccine but patient still declined. Advised may receive this vaccine at local pharmacy or Health Dept. Aware to provide a copy of the vaccination record if obtained from local pharmacy or Health Dept. Verbalized acceptance and understanding.   Covid-19 vaccine status: Completed vaccines  Qualifies for Shingles Vaccine? Yes   Zostavax completed Yes   Shingrix Completed?: Yes  Screening Tests Health Maintenance  Topic Date Due   OPHTHALMOLOGY EXAM  03/23/2016   COVID-19 Vaccine (6 - 2023-24 season) 05/14/2022   INFLUENZA VACCINE  12/12/2022 (Originally 04/13/2022)   Pneumonia Vaccine 64+ Years old (1 - PCV) 04/02/2023 (Originally [birth year]1962)    Hepatitis C Screening  10/11/2109 (Originally 07/15/1974)   HEMOGLOBIN A1C  10/02/2022   Diabetic kidney evaluation - Urine ACR  04/02/2023   FOOT EXAM  04/02/2023   Diabetic kidney evaluation - eGFR measurement  07/17/2023   Medicare Annual Wellness (AWV)  10/01/2023   COLONOSCOPY (Pts 45-80yr Insurance coverage will need to be confirmed)  02/28/2029   Zoster Vaccines- Shingrix  Completed   HPV VACCINES  Aged Out   DTaP/Tdap/Td  Discontinued    Health Maintenance  Health Maintenance Due  Topic Date Due   OPHTHALMOLOGY EXAM  03/23/2016   COVID-19 Vaccine (6 - 2023-24 season) 05/14/2022    Colorectal cancer screening: Type of screening: Colonoscopy. Completed 03/01/19. Repeat every 10 years  Additional Screening:  Hepatitis C Screening: does qualify;  Vision Screening: Recommended annual ophthalmology exams for early detection of glaucoma and other disorders of the eye. Is the patient up to date with their annual eye exam?  No  Who is the provider or what is the name of the office in which the patient attends annual eye exams? [facility]  If pt is not  established with a provider, would they like to be referred to a provider to establish care? No .   Dental Screening: Recommended annual dental exams for proper oral hygiene  Community Resource Referral / Chronic Care Management: CRR required this visit?  No   CCM required this visit?  No      Plan:     I have personally reviewed and noted the following in the patient's chart:   Medical and social history Use of alcohol, tobacco or illicit drugs  Current medications and supplements including opioid prescriptions. Patient is not currently taking opioid prescriptions. Functional ability and status Nutritional status Physical activity Advanced directives List of other physicians Hospitalizations, surgeries, and ER visits in previous 12 months Vitals Screenings to include cognitive, depression, and falls Referrals and  appointments  In addition, I have reviewed and discussed with patient certain preventive protocols, quality metrics, and best practice recommendations. A written personalized care plan for preventive services as well as general preventive health recommendations were provided to patient.     Willette Brace, LPN   [id number]/[id number]   Nurse Notes: Pt declined , Pt was very knowledgeable to answer questions appropriately.

## 2022-10-28 ENCOUNTER — Encounter: Payer: Self-pay | Admitting: Family

## 2022-10-28 ENCOUNTER — Ambulatory Visit (INDEPENDENT_AMBULATORY_CARE_PROVIDER_SITE_OTHER): Payer: Medicare Other | Admitting: Family

## 2022-10-28 VITALS — BP 119/79 | HR 69 | Temp 97.5°F | Ht 69.0 in | Wt 235.0 lb

## 2022-10-28 DIAGNOSIS — H6121 Impacted cerumen, right ear: Secondary | ICD-10-CM | POA: Diagnosis not present

## 2022-10-28 NOTE — Progress Notes (Signed)
Patient ID: Mark Frey, male    DOB: July 23, 1956, 67 y.o.   MRN: [id number]  Chief Complaint  Patient presents with   Ear Fullness    Pt c/o bilateral ear fullness for about 3 weeks after a sinus infection he had.     HPI:      Ear fullness:  reports right ear fullness and decreased hearing, no longer having sinus drainage, reports he has the ear pop a few times.  Denies any pain. Has hx of cerumen impaction.  Assessment & Plan:  1. Impacted cerumen of right ear - Verbal consent received to perform right ear lavage via Hydrogen peroxide/water mix solution. Curette used to remove visible cerumen. Pt tolerated well, complete evacuation of all cerumen obtained. Mild erythema but no bleeding noted in ear canal after procedure.  Advised pt restart Flonase nasal spray to try and help ear fullness, use 1-2 x/day along with nasal saline spray prior to Flonase and tid prn. Pt reports having spring allergy sx, advised this is a good time to start daily regimen to get ahead of sx.   Subjective:    Outpatient Medications Prior to Visit  Medication Sig Dispense Refill   acetaminophen (TYLENOL) 325 MG tablet Take 2 tablets (650 mg total) by mouth every 6 (six) hours as needed for mild pain (or temp > 100).     B Complex Vitamins (VITAMIN B COMPLEX PO) Take 1 tablet by mouth daily.      Bismuth Subgallate (DEVROM) 200 MG CAPS Take 200 mg by mouth daily.     loperamide (IMODIUM) 2 MG capsule Take 2 mg by mouth as needed for diarrhea or loose stools.     Magnesium Oxide -Mg Supplement 400 MG CAPS Take 400 mg by mouth daily at 6 (six) AM.     No facility-administered medications prior to visit.   Past Medical History:  Diagnosis Date   ALLERGIC RHINITIS    Cancer ([place])    Lymphoma    Diabetes mellitus    History of deep venous thrombosis (DVT) of distal vein of right lower extremity    Also bilateral PE.  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 so was  considered provoked   Hyperlipidemia    Past Surgical History:  Procedure Laterality Date   BIOPSY  03/15/2018   Procedure: BIOPSY;  Surgeon: Milus Banister, MD;  [facility];  Service: Endoscopy;;   BIOPSY  03/01/2019   Procedure: BIOPSY;  Surgeon: Ladene Artist, MD;  [facility];  Service: Endoscopy;;   BOWEL RESECTION N/A 08/25/2018   Procedure: LAPROSCOPIC LOOP ILEOSTOMY;  Surgeon: Greer Pickerel, MD;  [name];  Service: General;  Laterality: N/A;   CLEFT PALATE REPAIR     COLECTOMY  07/30/2019   COLONOSCOPY N/A 03/15/2018   Procedure: COLONOSCOPY;  Surgeon: Milus Banister, MD;  [facility];  Service: Endoscopy;  Laterality: N/A;   COLONOSCOPY N/A 08/20/2018   Procedure: COLONOSCOPY;  Surgeon: Irene Shipper, MD;  [facility];  Service: Endoscopy;  Laterality: N/A;   deviated septum repair     slight improvement   ESOPHAGOGASTRODUODENOSCOPY N/A 03/15/2018   Procedure: ESOPHAGOGASTRODUODENOSCOPY (EGD);  Surgeon: Milus Banister, MD;  [name];  Service: Endoscopy;  Laterality: N/A;   FLEXIBLE SIGMOIDOSCOPY N/A 03/01/2019   Procedure: FLEXIBLE SIGMOIDOSCOPY;  Surgeon: Ladene Artist, MD;  [facility];  Service: Endoscopy;  Laterality: N/A;   ILEOSCOPY N/A 03/01/2019  Procedure: ILEOSCOPY THROUGH STOMA;  Surgeon: Ladene Artist, MD;  [name];  Service: Endoscopy;  Laterality: N/A;   IR IMAGING GUIDED PORT INSERTION  03/17/2018   IR REMOVAL TUN ACCESS W/ PORT W/O FL MOD SED  09/22/2021   LAPAROSCOPY N/A 08/25/2018   Procedure: LAPAROSCOPY DIAGNOSTIC;  Surgeon: Greer Pickerel, MD;  [facility];  Service: General;  Laterality: N/A;   TONSILLECTOMY     Allergies  Allergen Reactions   Ciprofloxacin Other (See Comments)    Leg tingling      Objective:    Physical Exam Vitals and nursing note reviewed.  Constitutional:      General: He is not in acute distress.    Appearance: Normal  appearance.  HENT:     Head: Normocephalic.     Right Ear: Ear canal normal. A middle ear effusion (mildly opaque) is present. Tympanic membrane is retracted. Tympanic membrane is not erythematous or bulging.     Left Ear: Tympanic membrane and ear canal normal.  Cardiovascular:     Rate and Rhythm: Normal rate and regular rhythm.  Pulmonary:     Effort: Pulmonary effort is normal.     Breath sounds: Normal breath sounds.  Musculoskeletal:        General: Normal range of motion.     Cervical back: Normal range of motion.  Skin:    General: Skin is warm and dry.  Neurological:     Mental Status: He is alert and oriented to person, place, and time.  Psychiatric:        Mood and Affect: Mood normal.    BP 119/79 (BP Location: Left Arm, Patient Position: Sitting, Cuff Size: Large)   Pulse 69   Temp (!) 97.5 F (36.4 C) (Temporal)   Ht 5' 9"$  (1.753 m)   Wt 235 lb (106.6 kg)   SpO2 95%   BMI 34.70 kg/m  Wt Readings from Last 3 Encounters:  10/28/22 235 lb (106.6 kg)  09/30/22 232 lb (105.2 kg)  07/16/22 232 lb (105.2 kg)      Jeanie Sewer, NP

## 2022-11-29 ENCOUNTER — Encounter: Payer: Self-pay | Admitting: Family Medicine

## 2022-11-29 ENCOUNTER — Ambulatory Visit (INDEPENDENT_AMBULATORY_CARE_PROVIDER_SITE_OTHER): Payer: Medicare Other | Admitting: Family Medicine

## 2022-11-29 VITALS — BP 115/82 | HR 83 | Temp 97.1°F | Ht 69.0 in | Wt 236.0 lb

## 2022-11-29 DIAGNOSIS — L720 Epidermal cyst: Secondary | ICD-10-CM

## 2022-11-29 NOTE — Progress Notes (Signed)
   Ikechi Babel is a 67 y.o. male who presents today for an office visit.  Assessment/Plan:  Epidermoid cyst No red flags.  It is reassuring that his symptoms seem to be improving over the last few days.  The nodule in his skin is likely due to epidermoid cyst.  Lipoma is also a consideration however would not expect rapid improvement over the last couple of days as he is experienced.  We did discuss obtaining ultrasound to confirm diagnosis however given his significant improvement in the last few days do not think this is necessary at this point.  He will continue to monitor for the next few weeks.  We discussed reasons to return to care.  Follow-up as needed.    Subjective:  HPI:  Patient here with lump on RLQ of his abdominal pain for the last week or so.  Initially was painful however pain is at the site of.  It has decreased in size over the last week or so.  No obvious injuries or precipitating events.  No treatments tried.  No fevers or chills.       Objective:  Physical Exam: BP 115/82   Pulse 83   Temp (!) 97.1 F (36.2 C) (Temporal)   Ht 5\' 9"  (1.753 m)   Wt 236 lb (107 kg)   SpO2 96%   BMI 34.85 kg/m   Gen: No acute distress, resting comfortably Skin: Approximately 1 cm freely mobile nodule on right lower abdominal wall under several hair follicles. Neuro: Grossly normal, moves all extremities Psych: Normal affect and thought content      Cu[place]utemoc Attar M. Jerline Pain, MD 11/29/2022 11:24 AM

## 2022-11-29 NOTE — Patient Instructions (Signed)
It was very nice to see you today!  You have a small cyst in your abdominal wall.  This should continue to improve.  Please let us know if you notice any new or worsening symptoms over the next several weeks.  Take care, Dr Jerline Pain  PLEASE NOTE:  If you had any lab tests, please let us know if you have not heard back within a few days. You may see your results on mychart before we have a chance to review them but we will give you a call once they are reviewed by [place].   If we ordered any referrals today, please let us know if you have not heard from their office within the next week.   If you had any urgent prescriptions sent in today, please check with the pharmacy within an hour of our visit to make sure the prescription was transmitted appropriately.   Please try these tips to maintain a healthy lifestyle:  Eat at least 3 REAL meals and 1-2 snacks per day.  Aim for no more than 5 hours between eating.  If you eat breakfast, please do so within one hour of getting up.   Each meal should contain half fruits/vegetables, one quarter protein, and one quarter carbs (no bigger than a computer mouse)  Cut down on sweet beverages. This includes juice, soda, and sweet tea.   Drink at least 1 glass of water with each meal and aim for at least 8 glasses per day  Exercise at least 150 minutes every week.    Epidermoid Cyst  An epidermoid cyst, also known as epidermal cyst, is a sac made of skin tissue. The sac contains a substance called keratin. Keratin is a protein that is normally secreted through the hair follicles. When keratin becomes trapped in the top layer of skin (epidermis), it can form an epidermoid cyst. Epidermoid cysts can be found anywhere on your body. These cysts are usually harmless (benign), and they may not cause symptoms unless they become inflamed or infected. What are the causes? This condition may be caused by: A blocked hair follicle. A hair that curls and re-enters the  skin instead of growing straight out of the skin (ingrown hair). A blocked pore. Irritated skin. An injury to the skin. Certain conditions that are passed along from parent to child (inherited). Human papillomavirus (HPV). This happens rarely when cysts occur on the bottom of the feet. Long-term (chronic) sun damage to the skin. What increases the risk? The following factors may make you more likely to develop an epidermoid cyst: Having acne. Being male. Having an injury to the skin. Being past puberty. Having certain rare genetic disorders. What are the signs or symptoms? The only symptom of this condition may be a small, painless lump underneath the skin. When an epidermal cyst ruptures, it may become inflamed. True infection in cysts is rare. Symptoms may include: Redness. Inflammation. Tenderness. Warmth. Keratin draining from the cyst. Keratin is grayish-white, bad-smelling substance. Pus draining from the cyst. How is this diagnosed? This condition is diagnosed with a physical exam. In some cases, you may have a sample of tissue (biopsy) taken from your cyst to be examined under a microscope or tested for bacteria. You may be referred to a health care provider who specializes in skin care (dermatologist). How is this treated? If a cyst becomes inflamed, treatment may include: Opening and draining the cyst, done by a health care provider. After draining, minor surgery to remove the rest of  the cyst may be done. Taking antibiotic medicine. Having injections of medicines (steroids) that help to reduce inflammation. Having surgery to remove the cyst. Surgery may be done if the cyst: Becomes large. Bothers you. Has a chance of turning into cancer. Do not try to open a cyst yourself. Follow these instructions at home: Medicines If you were prescribed an antibiotic medicine, take it it as told by your health care provider. Do not stop using the antibiotic even if you start to  feel better. Take over-the-counter and prescription medicines only as told by your health care provider. General instructions Keep the area around your cyst clean and dry. Wear loose, dry clothing. Avoid touching your cyst. Check your cyst every day for signs of infection. Check for: Redness, swelling, or pain. Fluid or blood. Warmth. Pus or a bad smell. Keep all follow-up visits. This is important. How is this prevented? Wear clean, dry, clothing. Avoid wearing tight clothing. Keep your skin clean and dry. Take showers or baths every day. Contact a health care provider if: Your cyst develops symptoms of infection. Your condition is not improving or is getting worse. You develop a cyst that looks different from other cysts you have had. You have a fever. Get help right away if: Redness spreads from the cyst into the surrounding area. Summary An epidermoid cyst is a sac made of skin tissue. These cysts are usually harmless (benign), and they may not cause symptoms unless they become inflamed. If a cyst becomes inflamed, treatment may include surgery to open and drain the cyst, or to remove it. Treatment may also include medicines by mouth or through an injection. Take over-the-counter and prescription medicines only as told by your health care provider. If you were prescribed an antibiotic medicine, take it as told by your health care provider. Do not stop using the antibiotic even if you start to feel better. Contact a health care provider if your condition is not improving or is getting worse. Keep all follow-up visits as told by your health care provider. This is important. This information is not intended to replace advice given to you by your health care provider. Make sure you discuss any questions you have with your health care provider. Document Revised: 12/05/2019 Document Reviewed: 12/05/2019 Elsevier Patient Education  [place].

## 2022-12-06 ENCOUNTER — Telehealth: Payer: Self-pay | Admitting: Family Medicine

## 2022-12-06 ENCOUNTER — Other Ambulatory Visit: Payer: Self-pay

## 2022-12-06 DIAGNOSIS — F325 Major depressive disorder, single episode, in full remission: Secondary | ICD-10-CM

## 2022-12-06 MED ORDER — CITALOPRAM HYDROBROMIDE 20 MG PO TABS: 20.0000 mg | ORAL_TABLET | Freq: Every day | ORAL | 3 refills | Status: DC

## 2022-12-06 MED ORDER — CITALOPRAM HYDROBROMIDE 10 MG PO TABS: 10.0000 mg | ORAL_TABLET | Freq: Every day | ORAL | 3 refills | Status: DC

## 2022-12-06 NOTE — Telephone Encounter (Signed)
Pts wife called and states pt was taken off of  citalopram (CELEXA) 20 MG tablet  Last summer but DR Yong Channel let pt know if they want to get back on it to call the office. Pt wants to get back on it. Please advise.  citalopram (CELEXA) 20 MG tablet

## 2022-12-06 NOTE — Telephone Encounter (Signed)
Rx refilled.

## 2023-01-14 ENCOUNTER — Other Ambulatory Visit: Payer: Self-pay

## 2023-01-14 DIAGNOSIS — C8378 Burkitt lymphoma, lymph nodes of multiple sites: Secondary | ICD-10-CM

## 2023-01-17 ENCOUNTER — Other Ambulatory Visit: Payer: Self-pay

## 2023-01-17 ENCOUNTER — Inpatient Hospital Stay: Payer: Medicare Other | Attending: Hematology

## 2023-01-17 ENCOUNTER — Inpatient Hospital Stay (HOSPITAL_BASED_OUTPATIENT_CLINIC_OR_DEPARTMENT_OTHER): Payer: Medicare Other | Admitting: Hematology

## 2023-01-17 VITALS — BP 138/84 | HR 83 | Temp 99.0°F | Resp 20 | Wt 238.8 lb

## 2023-01-17 DIAGNOSIS — Z86711 Personal history of pulmonary embolism: Secondary | ICD-10-CM | POA: Diagnosis not present

## 2023-01-17 DIAGNOSIS — Z8572 Personal history of non-Hodgkin lymphomas: Secondary | ICD-10-CM | POA: Insufficient documentation

## 2023-01-17 DIAGNOSIS — C8378 Burkitt lymphoma, lymph nodes of multiple sites: Secondary | ICD-10-CM | POA: Diagnosis not present

## 2023-01-17 DIAGNOSIS — Z86718 Personal history of other venous thrombosis and embolism: Secondary | ICD-10-CM | POA: Insufficient documentation

## 2023-01-17 LAB — CMP (CANCER CENTER ONLY)
ALT: 70 U/L — ABNORMAL HIGH (ref 0–44)
AST: 42 U/L — ABNORMAL HIGH (ref 15–41)
Albumin: 4.2 g/dL (ref 3.5–5.0)
Alkaline Phosphatase: 70 U/L (ref 38–126)
Anion gap: 7 (ref 5–15)
BUN: 14 mg/dL (ref 8–23)
CO2: 22 mmol/L (ref 22–32)
Calcium: 9.2 mg/dL (ref 8.9–10.3)
Chloride: 108 mmol/L (ref 98–111)
Creatinine: 1.08 mg/dL (ref 0.61–1.24)
GFR, Estimated: >60 mL/min (ref >60)
Glucose, Bld: 111 mg/dL — ABNORMAL HIGH (ref 70–99)
Potassium: 4 mmol/L (ref 3.5–5.1)
Sodium: 137 mmol/L (ref 135–145)
Total Bilirubin: 0.3 mg/dL (ref 0.3–1.2)
Total Protein: 7.5 g/dL (ref 6.5–8.1)

## 2023-01-17 LAB — CBC WITH DIFFERENTIAL (CANCER CENTER ONLY)
Abs Immature Granulocytes: 0.02 10*3/uL (ref 0.00–0.07)
Basophils Absolute: 0 10*3/uL (ref 0.0–0.1)
Basophils Relative: 1 %
Eosinophils Absolute: 0.1 10*3/uL (ref 0.0–0.5)
Eosinophils Relative: 1 %
HCT: 43.3 % (ref 39.0–52.0)
Hemoglobin: 15 g/dL (ref 13.0–17.0)
Immature Granulocytes: 0 %
Lymphocytes Relative: 32 %
Lymphs Abs: 2.2 10*3/uL (ref 0.7–4.0)
MCH: 35.5 pg — ABNORMAL HIGH (ref 26.0–34.0)
MCHC: 34.6 g/dL (ref 30.0–36.0)
MCV: 102.6 fL — ABNORMAL HIGH (ref 80.0–100.0)
Monocytes Absolute: 0.6 10*3/uL (ref 0.1–1.0)
Monocytes Relative: 8 %
Neutro Abs: 4 10*3/uL (ref 1.7–7.7)
Neutrophils Relative %: 58 %
Platelet Count: 202 10*3/uL (ref 150–400)
RBC: 4.22 MIL/uL (ref 4.22–5.81)
RDW: 14.5 % (ref 11.5–15.5)
WBC Count: 6.9 10*3/uL (ref 4.0–10.5)
nRBC: 0 % (ref 0.0–0.2)

## 2023-01-17 LAB — LACTATE DEHYDROGENASE: LDH: 135 U/L (ref 98–192)

## 2023-01-17 NOTE — Progress Notes (Signed)
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  INTERVAL HISTORY:   Mark Frey is here in clinic today with his wife for continued evaluation and management of his Burkitt's lymphoma.   Patient was last seen by me on 07/16/2022 and he was doing well overall.   Patient is accompanied by his wife during this visit. He reports he has been doing well overall without any new or severe medical concerns since our last visit. He denies fever, chills, night sweats, infection issues, unexpected weight loss, abdominal pain, new lumps/bumps, back pain, chest pain, shortness of breath. He does report of mild occasional bilateral leg swelling.   He is complaint with all of his medications. Patient denies checking his glucose levels at home.   Patient's wife notes that the patient had mild lump near his right lower abdominal around 2-3 months ago. Patient notes that his lump has disappeared.   MEDICAL HISTORY:  Past Medical History:  Diagnosis Date   ALLERGIC RHINITIS    Cancer (HCC)    Lymphoma    Diabetes mellitus    History of deep venous thrombosis (DVT) of distal vein of right lower extremity    Also bilateral PE.  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 so was considered provoked   Hyperlipidemia     SURGICAL HISTORY: Past Surgical History:  Procedure Laterality Date   BIOPSY  03/15/2018   Procedure: BIOPSY;  Surgeon: Rachael Fee, MD;  Location: [facility];  Service: Endoscopy;;   BIOPSY  03/01/2019   Procedure: BIOPSY;  Surgeon: Meryl Dare, MD;  Location: [facility];  Service: Endoscopy;;   BOWEL RESECTION N/A 08/25/2018    Procedure: LAPROSCOPIC LOOP ILEOSTOMY;  Surgeon: Gaynelle Adu, MD;  Location: [facility];  Service: General;  Laterality: N/A;   CLEFT PALATE REPAIR     COLECTOMY  07/30/2019   COLONOSCOPY N/A 03/15/2018   Procedure: COLONOSCOPY;  Surgeon: Rachael Fee, MD;  Location: [facility];  Service: Endoscopy;  Laterality: N/A;   COLONOSCOPY N/A 08/20/2018   Procedure: COLONOSCOPY;  Surgeon: Hilarie Fredrickson, MD;  Location: [facility];  Service: Endoscopy;  Laterality: N/A;   deviated septum repair     slight improvement   ESOPHAGOGASTRODUODENOSCOPY N/A 03/15/2018   Procedure: ESOPHAGOGASTRODUODENOSCOPY (EGD);  Surgeon: Rachael Fee, MD;  Location: [facility];  Service: Endoscopy;  Laterality: N/A;   FLEXIBLE SIGMOIDOSCOPY N/A 03/01/2019   Procedure: FLEXIBLE SIGMOIDOSCOPY;  Surgeon: Meryl Dare, MD;  Location: [facility];  Service: Endoscopy;  Laterality: N/A;   ILEOSCOPY N/A 03/01/2019   Procedure: ILEOSCOPY THROUGH STOMA;  Surgeon: Meryl Dare, MD;  Location: [facility];  Service: Endoscopy;  Laterality: N/A;   IR IMAGING GUIDED PORT INSERTION  03/17/2018   IR REMOVAL TUN ACCESS W/ PORT W/O FL MOD SED  09/22/2021   LAPAROSCOPY N/A 08/25/2018   Procedure: LAPAROSCOPY DIAGNOSTIC;  Surgeon: Gaynelle Adu, MD;  Location: [facility];  Service: General;  Laterality: N/A;   TONSILLECTOMY      SOCIAL HISTORY: Social History   Socioeconomic History   Marital status: Married    Spouse name: lisa   Number of children: 0   Years of education: Not on  file   Highest education level: Not on file  Occupational History   Not on file  Tobacco Use   Smoking status: Never   Smokeless tobacco: Never  Vaping Use   Vaping Use: Never used  Substance and Sexual Activity   Alcohol use: Not Currently    Comment: occasional   Drug use: No   Sexual activity: Yes  Other Topics Concern   Not on file  Social History Narrative   Married 1985. No kids. 4 small dogs.      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, residential      Hobbies: work on cars, Presenter, broadcasting, exercise as able   Social Determinants of Health   Financial Resource Strain: Low Risk  (09/30/2022)   Overall Financial Resource Strain (CARDIA)    Difficulty of Paying Living Expenses: Not hard at all  Food Insecurity: No Food Insecurity (09/30/2022)   Hunger Vital Sign    Worried About Running Out of Food in the Last Year: Never true    Ran Out of Food in the Last Year: Never true  Transportation Needs: No Transportation Needs (09/30/2022)   PRAPARE - Administrator, Civil Service (Medical): No    Lack of Transportation (Non-Medical): No  Physical Activity: Inactive (09/30/2022)   Exercise Vital Sign    Days of Exercise per Week: 0 days    Minutes of Exercise per Session: 0 min  Stress: No Stress Concern Present (09/30/2022)   [employer] of Occupational Health - Occupational Stress Questionnaire    Feeling of Stress : Not at all  Social Connections: Moderately Isolated (09/30/2022)   Social Connection and Isolation Panel [NHANES]    Frequency of Communication with Friends and Family: More than three times a week    Frequency of Social Gatherings with Friends and Family: More than three times a week    Attends Religious Services: Never    Database administrator or Organizations: No    Attends Banker Meetings: Never    Marital Status: Married  Catering manager Violence: Not At Risk (09/30/2022)   Humiliation, Afraid, Rape, and Kick questionnaire    Fear of Current or Ex-Partner: No    Emotionally Abused: No    Physically Abused: No    Sexually Abused: No    FAMILY HISTORY: Family History  Problem Relation Age of Onset   Lung cancer Mother        smoker   Brain cancer Mother        metastasis   AAA (abdominal aortic aneurysm) Father        smoker    ALLERGIES:  is allergic to ciprofloxacin.  MEDICATIONS:  Current Outpatient Medications  Medication Sig Dispense Refill    acetaminophen (TYLENOL) 325 MG tablet Take 2 tablets (650 mg total) by mouth every 6 (six) hours as needed for mild pain (or temp > 100).     B Complex Vitamins (VITAMIN B COMPLEX PO) Take 1 tablet by mouth daily.      Bismuth Subgallate (DEVROM) 200 MG CAPS Take 200 mg by mouth daily.     citalopram (CELEXA) 20 MG tablet Take 1 tablet (20 mg total) by mouth daily. 90 tablet 3   loperamide (IMODIUM) 2 MG capsule Take 2 mg by mouth as needed for diarrhea or loose stools.     Magnesium Oxide -Mg Supplement 400 MG CAPS Take 400 mg by mouth daily at 6 (six) AM.     No current facility-administered medications for this  visit.    REVIEW OF SYSTEMS:    10 Point review of Systems was done is negative except as noted above.   PHYSICAL EXAMINATION: ECOG FS:2 - Symptomatic, <50% confined to bed  There were no vitals filed for this visit.  Wt Readings from Last 3 Encounters:  11/29/22 236 lb (107 kg)  10/28/22 235 lb (106.6 kg)  09/30/22 232 lb (105.2 kg)   There is no height or weight on file to calculate BMI.   [name] NAD GENERAL:alert, in no acute distress and comfortable SKIN: no acute rashes, no significant lesions EYES: conjunctiva are pink and non-injected, sclera anicteric OROPHARYNX: MMM, no exudates, no oropharyngeal erythema or ulceration NECK: supple, no JVD LYMPH:  no palpable lymphadenopathy in the cervical, axillary or inguinal regions LUNGS: clear to auscultation b/l with normal respiratory effort HEART: regular rate & rhythm ABDOMEN:  normoactive bowel sounds , non tender, not distended.Colostomy in place. Extremity: no pedal edema PSYCH: alert & oriented x 3 with fluent speech NEURO: no focal motor/sensory deficits   LABORATORY DATA:      Latest Ref Rng & Units 01/17/2023   12:52 PM 07/16/2022    1:23 PM 01/12/2022   12:41 PM  CBC  WBC 4.0 - 10.5 K/uL 6.9  7.0  6.4   Hemoglobin 13.0 - 17.0 g/dL 16.1  09.6  04.5   Hematocrit 39.0 - 52.0 % 43.3  41.6  43.1   Platelets  150 - 400 K/uL 202  183  196         Latest Ref Rng & Units 01/17/2023   12:52 PM 07/16/2022    1:23 PM 01/12/2022   12:41 PM  CMP  Glucose 70 - 99 mg/dL 409  811  99   BUN 8 - 23 mg/dL 14  19  11    Creatinine 0.61 - 1.24 mg/dL 9.14  7.82  9.56   Sodium 135 - 145 mmol/L 137  140  139   Potassium 3.5 - 5.1 mmol/L 4.0  4.4  4.0   Chloride 98 - 111 mmol/L 108  108  107   CO2 22 - 32 mmol/L 22  28  26    Calcium 8.9 - 10.3 mg/dL 9.2  9.5  9.8   Total Protein 6.5 - 8.1 g/dL 7.5  7.2  7.5   Total Bilirubin 0.3 - 1.2 mg/dL 0.3  0.3  0.3   Alkaline Phos 38 - 126 U/L 70  85  68   AST 15 - 41 U/L 42  32  39   ALT 0 - 44 U/L 70  58  54    Lab Results  Component Value Date   LDH 135 01/17/2023    03/01/19 Colonoscopy biopsies:     03/17/18 Cytogenetics:   03/17/18 Colon Bx:   03/13/18 Peritoneum Biopsy:     07/02/2019 PORTABLE CHEST 1 VIEW (Accession [id number])    07/02/2019 CT ABDOMEN AND PELVIS WITH CONTRAST (Accession [id number])  RADIOGRAPHIC STUDIES:  I have personally reviewed the radiological images as listed and agreed with the findings in the report. No results found.  ASSESSMENT & PLAN:   67 y.o. male with   1.  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 03/10/18 CT A/P revealed Large irregular infiltrative solid mass in the right lower quadrant measuring up to 18.7 x 18.5 x 17.8 cm, infiltrating and encasing multiple distal small bowel loops and likely the ileocecal region, partially encasing the  sigmoid colon, with prominent extension into the right lower retroperitoneum and extraperitoneal right pelvis with encasement of right external iliac and proximal right common iliac vasculature and infiltration of the right iliopsoas muscle. No BM or CNS involvement.    04/18/18 PET/CT revealed Continued improvement in right colonic wall thickening and surrounding bulky masses consistent with  treated lymphoma. No hypermetabolic activity to suggest residual tumor. There is some hypermetabolic activity associated with the sigmoid colon which demonstrates mild wall thickening, surrounding inflammation and underlying diverticulosis, suggesting mild diverticulitis. Suspected treatment related changes throughout the bone marrow. There is mildly increased activity within the clivus without clear corresponding finding on the CT images. Attention on follow-up recommended. Nonobstructing right renal calculi. 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    06/09/18 PET/CT revealed Soft tissue lesion within the ventral pelvis is again identified demonstrating mild to moderate increased uptake within SUV max of 5.61. Deauville criteria 4. Concerning for residual metabolically active tumor. 2. Similar appearance of diffusely thickened appendix within the right lower quadrant of the abdomen. Findings may reflect treatment related changes. 3. Increased radiotracer uptake throughout the bone marrow which is favored to represent treatment related changes. 4. Lad coronary artery atherosclerotic calcifications.   10/05/18 CT A/P revealed Persistent wall thickening in the sigmoid colon although a component of this could be due to diverticulosis. There is also a suggestion of wall thickening and potential mucosal enhancement in the terminal ileum, as well as adjacent indistinct stranding in the mesenteric and omental adipose tissues roughly similar to the prior exam. Some of this may be treatment related. Inflammatory process such as Crohn's disease not excluded. 2. No current adenopathy. 3. Other imaging findings of potential clinical significance: Nonobstructive right nephrolithiasis. Aortic Atherosclerosis. Moderate prostatomegaly. Left foraminal impingement at L4-5.  S/p IMRT with 36 Gy over 20 fractions, between 11/09/18 and 12/06/18 with Dr. Basilio Cairo  05/24/2019 CT CAP revealed "1. Overall little interval change from CT  05/09/2019. Persistent inflamed loop of distal sigmoid colon with submucosal edema. Potential fistulous communication versus tethering to the ascending colon and a loop of small bowel. Findings are suggestive of inflammatory bowel disease. 2. No evidence of bowel obstruction. Oral contrast enters the ileostomy bag with bowel obstruction or small bowel dilatation."   2. H/o RLE DVT and b/l PE now off anticoagulation   3.  History of recurrent small bowel obstruction/ileus with sigmoid thickening/stenosis .  CT scan - sigmoid obstructive lesion ? Scar tissue from resolved lymphoma vs residual lymphoma vs post inflammatory scarring from diverticulitis/limited perforation. Also has SBO from adhesions. Colonoscopy 08/21/2018 - no intramucosal lesions. Extrinsic compression of sigmoid colon. S/p diverting ileostomy on 08/25/18 with Dr. Gaynelle Adu  4.  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  GI panel and C. difficile negative.  Now s/p colectomy  --surgical pathology with no residual lymphoma.   PLAN: -Discussed lab results from today, 01/17/2023, with the patient. CBC is stable. CMP shows elevated glucose level at 111 and slightly increased ALT at 70.  -recommended to continue to follow-up with PCP for monitoring and further evaluation of abnormal LFTs ? Fatty liver. -Patient has no new symptoms or signs or lab findings concerning for lymphoma recurrence/progression at this time. -No indication for additional hematologic work-up or treatment at this time.  FOLLOW-UP: Return to clinic with Dr. Candise Che with labs in 12 months   The total time spent in the appointment was 20 minutes* .  All of the patient's questions were answered with apparent  satisfaction. The patient knows to call the clinic with any problems, questions or concerns.   Wyvonnia Lora MD MS AAHIVMS [facility] Hematology/Oncology Physician [facility]  .*Total Encounter Time as defined by the Centers for  Medicare and Medicaid Services includes, in addition to the face-to-face time of a patient visit (documented in the note above) non-face-to-face time: obtaining and reviewing outside history, ordering and reviewing medications, tests or procedures, care coordination (communications with other health care professionals or caregivers) and documentation in the medical record.   I, Ok Edwards, am acting as a Neurosurgeon for Wyvonnia Lora, MD. .I have reviewed the above documentation for accuracy and completeness, and I agree with the above. Johney Maine MD

## 2023-01-18 ENCOUNTER — Telehealth: Payer: Self-pay | Admitting: Hematology

## 2023-01-24 ENCOUNTER — Encounter: Payer: Self-pay | Admitting: Hematology

## 2023-04-05 ENCOUNTER — Encounter: Payer: Self-pay | Admitting: Family Medicine

## 2023-04-05 ENCOUNTER — Ambulatory Visit (INDEPENDENT_AMBULATORY_CARE_PROVIDER_SITE_OTHER): Payer: Medicare Other | Admitting: Family Medicine

## 2023-04-05 VITALS — BP 128/72 | HR 94 | Temp 98.7°F | Ht 69.0 in | Wt 237.4 lb

## 2023-04-05 DIAGNOSIS — F325 Major depressive disorder, single episode, in full remission: Secondary | ICD-10-CM | POA: Diagnosis not present

## 2023-04-05 DIAGNOSIS — E119 Type 2 diabetes mellitus without complications: Secondary | ICD-10-CM | POA: Diagnosis not present

## 2023-04-05 MED ORDER — CITALOPRAM HYDROBROMIDE 10 MG PO TABS: 10.0000 mg | ORAL_TABLET | Freq: Every day | ORAL | 3 refills | Status: DC

## 2023-04-05 NOTE — Assessment & Plan Note (Signed)
S: Medication: celexa 10 mg working well    04/05/2023    2:44 PM 11/29/2022   10:57 AM 10/28/2022    1:30 PM  Depression screen PHQ 2/9  Decreased Interest 0 0 0  Down, Depressed, Hopeless 0 0 0  PHQ - 2 Score 0 0 0  Altered sleeping 0 0 0  Tired, decreased energy 0 0 0  Change in appetite 0 0 0  Feeling bad or failure about yourself  0 0 0  Trouble concentrating 0 0 0  Moving slowly or fidgety/restless 0  0  Suicidal thoughts 0 0 0  PHQ-9 Score 0 0 0  Difficult doing work/chores Not difficult at all Not difficult at all Not difficult at all  A/P: full remission- continue current medications  . Had trialed off but had recurrence.

## 2023-04-05 NOTE — Patient Instructions (Addendum)
Let us know if you get a COVID vaccine this fall or pneumonia vaccine at your pharmacy.  Please stop by lab before you go If you have mychart- we will send your results within 3 business days of [place] receiving them.  If you do not have mychart- we will call you about results within 5 business days of [place] receiving them.  *please also note that you will see labs on mychart as soon as they post. I will later go in and write notes on them- will say "notes from Dr. Durene Cal"   Health Maintenance Due  Topic Date Due   OPHTHALMOLOGY EXAM - declines 03/23/2016   COVID-19 Vaccine (6 - 2023-24 season)- declines 05/14/2022   Recommended follow up: Return in about 1 year (around 04/04/2024) for followup or sooner if needed.Schedule b4 you leave.

## 2023-04-05 NOTE — Progress Notes (Signed)
Phone [phone] In person visit   Subjective:   Mark Frey is a 67 y.o. 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) 03/15/2018    Priority: High   History of pulmonary embolism 03/10/2018    Priority: High   Controlled diabetes mellitus type II without complication (HCC) 06/05/2014    Priority: High   Depression, major, single episode, complete remission (HCC) 04/17/2019    Priority: Medium    Hypomagnesemia 01/11/2019    Priority: Medium    Edema     Priority: Medium    Tachycardia 01/31/2017    Priority: Medium    Hypertension associated with diabetes (HCC) 06/05/2014    Priority: Medium    Hyperlipidemia associated with type 2 diabetes mellitus (HCC) 05/12/2007    Priority: Medium    Radiation gastroenteritis     Priority: Low   Radiation colitis     Priority: Low   Loop ilesotomy for fecal diversion of sigmoid stricture 08/25/2018    Priority: Low   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HCC) 08/19/2018    Priority: Low   Counseling regarding advance care planning and goals of care 04/25/2018    Priority: Low   Dysmetabolic syndrome X 01/27/2009    Priority: Low   BACK PAIN WITH RADICULOPATHY 06/09/2007    Priority: Low   ALLERGIC RHINITIS 05/12/2007    Priority: Low   Essential hypertension 10/03/2021   Elevated ALT measurement 10/03/2021   Port-A-Cath in place 03/27/2018    Medications- reviewed and updated Current Outpatient Medications  Medication Sig Dispense Refill   acetaminophen  (TYLENOL) 325 MG tablet Take 2 tablets (650 mg total) by mouth every 6 (six) hours as needed for mild pain (or temp > 100).     B Complex Vitamins (VITAMIN B COMPLEX PO) Take 1 tablet by mouth daily.      Bismuth Subgallate (DEVROM) 200 MG CAPS Take 200 mg by mouth daily.     citalopram (CELEXA) 10 MG tablet Take 1 tablet (10 mg total) by mouth daily. 90 tablet 3   loperamide (IMODIUM) 2 MG capsule Take 2 mg by mouth as needed for diarrhea or loose stools.     Magnesium Oxide -Mg Supplement 400 MG CAPS Take 400 mg by mouth daily at 6 (six) AM.     No current facility-administered medications for this visit.     Objective:  BP 128/72   Pulse 94   Temp 98.7 F (37.1 C)   Ht 5\' 9"  (1.753 m)   Wt 237 lb 6.4 oz (107.7 kg)   SpO2 95%   BMI 35.06 kg/m  Gen: NAD, resting comfortably CV: RRR no murmurs rubs or gallops Lungs: CTAB no crackles, wheeze, rhonchi Abdomen: soft/nontender/nondistended/normal bowel sounds. No rebound or guarding. Colostomy bag in place- appears healthy Ext: varicose veins noted throughotu lower legs- worse on back of legs/calf - does sleep in recliner which could contribute Skin: warm, dry Neuro: grossly normal, moves all extremities  Diabetic Foot Exam - Simple   Simple Foot Form Diabetic Foot exam was performed with the following findings: Yes 04/05/2023  3:46 PM  Visual Inspection No deformities, no ulcerations, no other skin breakdown bilaterally: Yes Sensation Testing Intact to touch and monofilament testing bilaterally: Yes Pulse Check Posterior Tibialis and Dorsalis pulse intact bilaterally: Yes Comments       Assessment and Plan   #Handicap placard- will need renewal next February- they will reach out  % History of high-grade B-cell lymphoma/Burkitt lymphoma  -Discovered on CT March 10, 2018-on initial measures 18.7 x 18.5 x 17.8 cm -S/p IMRT with 36 Gy over 20 fractions, between 11/09/18 and 12/06/18 with Dr. Basilio Cairo -Patient completed  chemotherapy October 2019 -Patient later developed small bowel obstruction/ileus-sigmoid obstructive lesion with question of scar tissue from resolved lymphoma versus postinflammatory scarring from diverticulitis/limited perforation-ultimately required diverting ileostomy 08/25/2018 with Dr. Gaynelle Adu -Patient ultimately had severe pancolitis thought related to radiation toxicity with recurrent abscesses and sepsis -Dr. Milbert Coulter at [facility] ultimately performed colectomy July 30 2019-permanent colostomy  in place after multiple episodes of diverticulitis/stricture episodes after radiation for burkitt lymphoma. No issues with supply -Patient remains on chronic magnesium-we will check magnesium each set of blood work including today  -also on chronic immodium post surgery- not needing all the time now.  - last visit with Dr. Candise Che 01/17/23 and he was allowed to return in 1 year- good report and no repeat imaging needed   #hypertension-has been off medication since weight loss related to Burkitt lymphoma and subsequent complications S: medication: None. No recent weight gain- stable within a pounds or two   BP Readings from Last 3 Encounters:  04/05/23 128/72  01/17/23 138/84  11/29/22 115/82  A/P: stable- continue without medicine  # Diabetes-has been off medication since weight loss related to Burkitt lymphoma and subsequent complications S: Medication: None  CBGs-does not check  Exercise and diet-peak weight 298 prior to Burkitt's lymphoma  Lab Results  Component Value Date   HGBA1C 5.9 (A) 04/01/2022   HGBA1C 5.8 (H) 06/23/2020   HGBA1C 5.1 02/01/2020  A/P: hopefully stable- update a1c today. Continue without meds for now   -he declines diabetic eye exam  # Depression S: Medication: celexa 10 mg working well    04/05/2023    2:44 PM 11/29/2022   10:57 AM 10/28/2022    1:30 PM  Depression screen PHQ 2/9  Decreased Interest 0 0 0  Down, Depressed, Hopeless 0 0 0  PHQ - 2 Score 0 0 0   Altered sleeping 0 0 0  Tired, decreased energy 0 0 0  Change in appetite 0 0 0  Feeling bad or failure about yourself  0 0 0  Trouble concentrating 0 0 0  Moving slowly or fidgety/restless 0  0  Suicidal thoughts 0 0 0  PHQ-9 Score 0 0 0  Difficult doing work/chores Not difficult at all Not difficult at all Not difficult at all  A/P: full remission- continue current medications  . Had trialed off but had recurrence.   #hyperlipidemia- refuses statin despite diabetes S: Medication: None  Lab Results  Component Value Date   CHOL 100 04/01/2022   HDL 25.60 (L) 04/01/2022   LDLCALC 45 04/01/2022   LDLDIRECT 80.0 02/01/2020   TRIG 145.0 04/01/2022   CHOLHDL 4 04/01/2022   A/P: lipids have looked really good even without medicine- discussed with diabetes diagnosis technically should still trial once week statin-he prefers to remain off- continue to monitor  and check today -there would be some risk with LFT elevation to worsen that so that's another reason to stay off- I wonder if with mild elevations could have fatty liver Lab Results  Component Value Date   ALT 70 (H) 01/17/2023   AST 42 (H) 01/17/2023   ALKPHOS 70 01/17/2023   BILITOT 0.3 01/17/2023  - if any further elevation may consider elastography    #Health maintenance - offered PSA blood test to monitor for prostate cancer- he declines Lab Results  Component Value Date   PSA 0.53 06/23/2020   PSA 0.87 08/29/2017   PSA 0.72 10/21/2015  - with total colectomy not candidate for colonoscopy -never smoker so doesn't need lung cancer screening -immunizations- holding off on further COVID shots -Td declines for now- knows needs if gets cut/scrape - declines flu shot Immunization History  Administered Date(s) Administered   PFIZER(Purple Top)SARS-COV-2 Vaccination 12/12/2019, 01/02/2020, 07/28/2020, 02/19/2021, 07/20/2021   Td 09/17/1998   Zoster Recombinant(Shingrix) 12/28/2017, 02/01/2020   Recommended follow up:  Return in about 1 year (around 04/04/2024) for followup or sooner if needed.Schedule b4 you leave. Future Appointments  Date Time Provider Department Center  10/06/2023  2:45 PM [facility] ANNUAL WELLNESS VISIT 1 [facility]  01/17/2024  1:00 PM [facility] None  01/17/2024  1:30 PM Johney Maine, MD [facility] None   Lab/Order associations:   ICD-10-CM   1. Depression, major, single episode, complete remission (HCC)  F32.5     2. Controlled type 2 diabetes mellitus without complication, without long-term current use of insulin (HCC)  E11.9 Urine Microalbumin w/creat. ratio    Hemoglobin A1c    Comprehensive metabolic panel    CBC with Differential/Platelet    Lipid panel    Urinalysis, Routine w reflex microscopic    3. Hypomagnesemia  E83.42 Magnesium      Meds ordered this encounter  Medications   citalopram (CELEXA) 10 MG tablet    Sig: Take 1 tablet (10 mg total) by mouth daily.    Dispense:  90 tablet    Refill:  3    Return precautions advised.  Tana Conch, MD

## 2023-04-06 LAB — LIPID PANEL
Cholesterol: 158 mg/dL (ref 0–200)
HDL: 28.8 mg/dL — ABNORMAL LOW (ref >39.00)
Total CHOL/HDL Ratio: 5
Triglycerides: 427 mg/dL — ABNORMAL HIGH (ref 0.0–149.0)

## 2023-04-06 LAB — CBC WITH DIFFERENTIAL/PLATELET
Basophils Absolute: 0.1 10*3/uL (ref 0.0–0.1)
Basophils Relative: 1.2 % (ref 0.0–3.0)
Eosinophils Absolute: 0.1 10*3/uL (ref 0.0–0.7)
Eosinophils Relative: 1.1 % (ref 0.0–5.0)
HCT: 42 % (ref 39.0–52.0)
Hemoglobin: 14.1 g/dL (ref 13.0–17.0)
Lymphocytes Relative: 30.3 % (ref 12.0–46.0)
Lymphs Abs: 1.9 10*3/uL (ref 0.7–4.0)
MCHC: 33.7 g/dL (ref 30.0–36.0)
MCV: 104.4 fl — ABNORMAL HIGH (ref 78.0–100.0)
Monocytes Absolute: 0.6 10*3/uL (ref 0.1–1.0)
Monocytes Relative: 10.1 % (ref 3.0–12.0)
Neutro Abs: 3.6 10*3/uL (ref 1.4–7.7)
Neutrophils Relative %: 57.3 % (ref 43.0–77.0)
Platelets: 236 10*3/uL (ref 150.0–400.0)
RBC: 4.02 Mil/uL — ABNORMAL LOW (ref 4.22–5.81)
RDW: 13.7 % (ref 11.5–15.5)
WBC: 6.2 10*3/uL (ref 4.0–10.5)

## 2023-04-06 LAB — URINALYSIS, ROUTINE W REFLEX MICROSCOPIC
Bilirubin Urine: NEGATIVE
Hgb urine dipstick: NEGATIVE
Ketones, ur: NEGATIVE
Leukocytes,Ua: NEGATIVE
Nitrite: NEGATIVE
Specific Gravity, Urine: 1.025 (ref 1.000–1.030)
Total Protein, Urine: 30 — AB
Urine Glucose: NEGATIVE
Urobilinogen, UA: 0.2 (ref 0.0–1.0)
pH: 6 (ref 5.0–8.0)

## 2023-04-06 LAB — COMPREHENSIVE METABOLIC PANEL
ALT: 64 U/L — ABNORMAL HIGH (ref 0–53)
AST: 43 U/L — ABNORMAL HIGH (ref 0–37)
Albumin: 4 g/dL (ref 3.5–5.2)
Alkaline Phosphatase: 60 U/L (ref 39–117)
BUN: 18 mg/dL (ref 6–23)
CO2: 24 mEq/L (ref 19–32)
Calcium: 9.9 mg/dL (ref 8.4–10.5)
Chloride: 103 mEq/L (ref 96–112)
Creatinine, Ser: 1.11 mg/dL (ref 0.40–1.50)
GFR: 69.09 mL/min (ref >60.00)
Glucose, Bld: 117 mg/dL — ABNORMAL HIGH (ref 70–99)
Potassium: 3.9 mEq/L (ref 3.5–5.1)
Sodium: 137 mEq/L (ref 135–145)
Total Bilirubin: 0.3 mg/dL (ref 0.2–1.2)
Total Protein: 7.2 g/dL (ref 6.0–8.3)

## 2023-04-06 LAB — HEMOGLOBIN A1C: Hgb A1c MFr Bld: 6.9 % — ABNORMAL HIGH (ref 4.6–6.5)

## 2023-04-06 LAB — MICROALBUMIN / CREATININE URINE RATIO
Creatinine,U: 202.2 mg/dL
Microalb Creat Ratio: 14.5 mg/g (ref 0.0–30.0)
Microalb, Ur: 29.2 mg/dL — ABNORMAL HIGH (ref 0.0–1.9)

## 2023-04-06 LAB — LDL CHOLESTEROL, DIRECT: Direct LDL: 94 mg/dL

## 2023-04-06 LAB — MAGNESIUM: Magnesium: 1.6 mg/dL (ref 1.5–2.5)

## 2023-09-30 ENCOUNTER — Encounter: Payer: Self-pay | Admitting: Family Medicine

## 2023-10-06 ENCOUNTER — Ambulatory Visit: Payer: Medicare Other

## 2023-10-06 VITALS — Wt 237.0 lb

## 2023-10-06 DIAGNOSIS — Z0000 Encounter for general adult medical examination without abnormal findings: Secondary | ICD-10-CM | POA: Diagnosis not present

## 2023-10-06 NOTE — Progress Notes (Signed)
Subjective:   Mark Frey is a 68 y.o. male who presents for Medicare Annual/Subsequent preventive examination.  Visit Complete: Virtual I connected with  Marilynn Latino on 10/06/23 by a audio enabled telemedicine application and verified that I am speaking with the correct person using two identifiers.  Patient Location: Home  Provider Location: Office/Clinic  I discussed the limitations of evaluation and management by telemedicine. The patient expressed understanding and agreed to proceed.  Vital Signs: Because this visit was a virtual/telehealth visit, some criteria may be missing or patient reported. Any vitals not documented were not able to be obtained and vitals that have been documented are patient reported.    Cardiac Risk Factors include: advanced age (>59men, >63 women);dyslipidemia;diabetes mellitus;male gender;hypertension;obesity (BMI >30kg/m2)     Objective:    Today's Vitals   10/06/23 1409  Weight: 237 lb (107.5 kg)   Body mass index is 35 kg/m.     10/06/2023    2:11 PM 09/30/2022    2:37 PM 10/03/2021    7:11 AM 09/22/2021   12:05 PM 05/09/2020    1:40 PM 01/06/2020    8:49 PM 12/03/2019   11:25 AM  Advanced Directives  Does Patient Have a Medical Advance Directive? Yes Yes No No No No No  Type of Estate agent of [place];Living will Healthcare Power of [place];Living will       Copy of Healthcare Power of Attorney in Chart? No - copy requested No - copy requested       Would patient like information on creating a medical advance directive?   No - Patient declined No - Patient declined No - Patient declined No - Patient declined No - Patient declined    Current Medications (verified) Outpatient Encounter Medications as of 10/06/2023  Medication Sig   acetaminophen (TYLENOL) 325 MG tablet Take 2 tablets (650 mg total) by mouth every 6 (six) hours as needed for mild pain (or temp > 100).   B Complex Vitamins (VITAMIN B  COMPLEX PO) Take 1 tablet by mouth daily.    Bismuth Subgallate (DEVROM) 200 MG CAPS Take 200 mg by mouth daily.   citalopram (CELEXA) 10 MG tablet Take 1 tablet (10 mg total) by mouth daily.   loperamide (IMODIUM) 2 MG capsule Take 2 mg by mouth as needed for diarrhea or loose stools.   Magnesium Oxide -Mg Supplement 400 MG CAPS Take 400 mg by mouth daily at 6 (six) AM.   No facility-administered encounter medications on file as of 10/06/2023.    Allergies (verified) Ciprofloxacin   History: Past Medical History:  Diagnosis Date   ALLERGIC RHINITIS    Cancer (HCC)    Lymphoma    Diabetes mellitus    History of deep venous thrombosis (DVT) of distal vein of right lower extremity    Also bilateral PE.  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 so was considered provoked   Hyperlipidemia    Past Surgical History:  Procedure Laterality Date   BIOPSY  03/15/2018   Procedure: BIOPSY;  Surgeon: Rachael Fee, MD;  Location: [facility];  Service: Endoscopy;;   BIOPSY  03/01/2019   Procedure: BIOPSY;  Surgeon: Meryl Dare, MD;  Location: [facility];  Service: Endoscopy;;   BOWEL RESECTION N/A 08/25/2018   Procedure: LAPROSCOPIC LOOP ILEOSTOMY;  Surgeon: Gaynelle Adu, MD;  Location: [facility];  Service: General;  Laterality: N/A;   CLEFT PALATE REPAIR  COLECTOMY  07/30/2019   COLONOSCOPY N/A 03/15/2018   Procedure: COLONOSCOPY;  Surgeon: Rachael Fee, MD;  Location: [facility];  Service: Endoscopy;  Laterality: N/A;   COLONOSCOPY N/A 08/20/2018   Procedure: COLONOSCOPY;  Surgeon: Hilarie Fredrickson, MD;  Location: [facility];  Service: Endoscopy;  Laterality: N/A;   deviated septum repair     slight improvement   ESOPHAGOGASTRODUODENOSCOPY N/A 03/15/2018   Procedure: ESOPHAGOGASTRODUODENOSCOPY (EGD);  Surgeon: Rachael Fee, MD;  Location: [facility];  Service: Endoscopy;  Laterality: N/A;   FLEXIBLE SIGMOIDOSCOPY N/A 03/01/2019   Procedure:  FLEXIBLE SIGMOIDOSCOPY;  Surgeon: Meryl Dare, MD;  Location: [facility];  Service: Endoscopy;  Laterality: N/A;   ILEOSCOPY N/A 03/01/2019   Procedure: ILEOSCOPY THROUGH STOMA;  Surgeon: Meryl Dare, MD;  Location: [facility];  Service: Endoscopy;  Laterality: N/A;   IR IMAGING GUIDED PORT INSERTION  03/17/2018   IR REMOVAL TUN ACCESS W/ PORT W/O FL MOD SED  09/22/2021   LAPAROSCOPY N/A 08/25/2018   Procedure: LAPAROSCOPY DIAGNOSTIC;  Surgeon: Gaynelle Adu, MD;  Location: [facility];  Service: General;  Laterality: N/A;   TONSILLECTOMY     Family History  Problem Relation Age of Onset   Lung cancer Mother        smoker   Brain cancer Mother        metastasis   AAA (abdominal aortic aneurysm) Father        smoker   Social History   Socioeconomic History   Marital status: Married    Spouse name: lisa   Number of children: 0   Years of education: Not on file   Highest education level: Not on file  Occupational History   Not on file  Tobacco Use   Smoking status: Never   Smokeless tobacco: Never  Vaping Use   Vaping status: Never Used  Substance and Sexual Activity   Alcohol use: Not Currently    Comment: occasional   Drug use: No   Sexual activity: Yes  Other Topics Concern   Not on file  Social History Narrative   Married 1985. No kids. 4 small dogs.      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, residential      Hobbies: work on cars, Presenter, broadcasting, exercise as able   Social Drivers of Health   Financial Resource Strain: Low Risk  (10/06/2023)   Overall Financial Resource Strain (CARDIA)    Difficulty of Paying Living Expenses: Not hard at all  Food Insecurity: No Food Insecurity (10/06/2023)   Hunger Vital Sign    Worried About Running Out of Food in the Last Year: Never true    Ran Out of Food in the Last Year: Never true  Transportation Needs: No Transportation Needs (10/06/2023)   PRAPARE - Administrator, Civil Service (Medical): No     Lack of Transportation (Non-Medical): No  Physical Activity: Inactive (10/06/2023)   Exercise Vital Sign    Days of Exercise per Week: 0 days    Minutes of Exercise per Session: 0 min  Stress: No Stress Concern Present (10/06/2023)   [employer] of Occupational Health - Occupational Stress Questionnaire    Feeling of Stress : Not at all  Social Connections: Moderately Isolated (10/06/2023)   Social Connection and Isolation Panel [NHANES]    Frequency of Communication with Friends and Family: More than three times a week    Frequency of Social Gatherings with Friends and Family: More than three times a week  Attends Religious Services: Never    Active Member of Clubs or Organizations: No    Attends Banker Meetings: Never    Marital Status: Married    Tobacco Counseling Counseling given: Not Answered   Clinical Intake:  Pre-visit preparation completed: Yes  Pain : No/denies pain     BMI - recorded: 35 Nutritional Status: BMI > 30  Obese Nutritional Risks: None Diabetes: Yes CBG done?: No Did pt. bring in CBG monitor from home?: No  How often do you need to have someone help you when you read instructions, pamphlets, or other written materials from your doctor or pharmacy?: 1 - Never  Interpreter Needed?: No  Information entered by :: Lanier Ensign, LPN   Activities of Daily Living    10/06/2023    2:10 PM  In your present state of health, do you have any difficulty performing the following activities:  Hearing? 0  Vision? 0  Difficulty concentrating or making decisions? 0  Walking or climbing stairs? 0  Dressing or bathing? 0  Doing errands, shopping? 0  Preparing Food and eating ? N  Using the Toilet? N  In the past six months, have you accidently leaked urine? N  Do you have problems with loss of bowel control? N  Managing your Medications? N  Managing your Finances? N  Housekeeping or managing your Housekeeping? N    Patient Care  Team: Shelva Majestic, MD as PCP - General (Family Medicine)  Indicate any recent Medical Services you may have received from other than [facility] providers in the past year (date may be approximate).     Assessment:   This is a routine wellness examination for [employer].  Hearing/Vision screen No results found.   Goals Addressed             This Visit's Progress    Patient Stated         Depression Screen    10/06/2023    2:12 PM 04/05/2023    2:44 PM 11/29/2022   10:57 AM 10/28/2022    1:30 PM 09/30/2022    2:36 PM 04/01/2022   11:00 AM 06/23/2020    2:39 PM  PHQ 2/9 Scores  PHQ - 2 Score 0 0 0 0 0 0 0  PHQ- 9 Score  0 0 0  0 0    Fall Risk    04/05/2023    2:44 PM 11/29/2022   10:58 AM 09/30/2022    2:38 PM 04/01/2022   10:35 AM 05/09/2017   10:10 AM  Fall Risk   Falls in the past year? 0 0 0 0 No  Number falls in past yr: 0 0 0 0   Injury with Fall? 0 0 0 0   Risk for fall due to : No Fall Risks No Fall Risks Impaired vision No Fall Risks   Follow up Falls evaluation completed  Falls prevention discussed Falls evaluation completed     MEDICARE RISK AT HOME: Medicare Risk at Home Any stairs in or around the home?: No If so, are there any without handrails?: No Home free of loose throw rugs in walkways, pet beds, electrical cords, etc?: Yes Adequate lighting in your home to reduce risk of falls?: Yes Life alert?: No Use of a cane, walker or w/c?: No Grab bars in the bathroom?: No Shower chair or bench in shower?: No Elevated toilet seat or a handicapped toilet?: No  TIMED UP AND GO:  Was the test performed?  No  Cognitive Function:declined     10/06/2023    2:13 PM  MMSE - Mini Mental State Exam  Not completed: Refused        Immunizations Immunization History  Administered Date(s) Administered   PFIZER(Purple Top)SARS-COV-2 Vaccination 12/12/2019, 01/02/2020, 07/28/2020, 02/19/2021, 07/20/2021   Td 09/17/1998   Zoster Recombinant(Shingrix)  12/28/2017, 02/01/2020    TDAP status: Due, Education has been provided regarding the importance of this vaccine. Advised may receive this vaccine at local pharmacy or Health Dept. Aware to provide a copy of the vaccination record if obtained from local pharmacy or Health Dept. Verbalized acceptance and understanding.  Flu Vaccine status: Declined, Education has been provided regarding the importance of this vaccine but patient still declined. Advised may receive this vaccine at local pharmacy or Health Dept. Aware to provide a copy of the vaccination record if obtained from local pharmacy or Health Dept. Verbalized acceptance and understanding.  Pneumococcal vaccine status: Declined,  Education has been provided regarding the importance of this vaccine but patient still declined. Advised may receive this vaccine at local pharmacy or Health Dept. Aware to provide a copy of the vaccination record if obtained from local pharmacy or Health Dept. Verbalized acceptance and understanding.   Covid-19 vaccine status: Declined, Education has been provided regarding the importance of this vaccine but patient still declined. Advised may receive this vaccine at local pharmacy or Health Dept.or vaccine clinic. Aware to provide a copy of the vaccination record if obtained from local pharmacy or Health Dept. Verbalized acceptance and understanding.  Qualifies for Shingles Vaccine? Yes   Zostavax completed Yes   Shingrix Completed?: Yes  Screening Tests Health Maintenance  Topic Date Due   OPHTHALMOLOGY EXAM  03/23/2016   INFLUENZA VACCINE  Never done   COVID-19 Vaccine (6 - 2024-25 season) 05/15/2023   HEMOGLOBIN A1C  10/06/2023   Pneumonia Vaccine 50+ Years old (1 of 2 - PCV) 04/04/2024 (Originally 07/15/1962)   Hepatitis C Screening  10/11/2109 (Originally 07/15/1974)   Diabetic kidney evaluation - eGFR measurement  04/04/2024   Diabetic kidney evaluation - Urine ACR  04/04/2024   FOOT EXAM  04/04/2024    Medicare Annual Wellness (AWV)  10/05/2024   Colonoscopy  02/28/2029   Zoster Vaccines- Shingrix  Completed   HPV VACCINES  Aged Out   DTaP/Tdap/Td  Discontinued    Health Maintenance  Health Maintenance Due  Topic Date Due   OPHTHALMOLOGY EXAM  03/23/2016   INFLUENZA VACCINE  Never done   COVID-19 Vaccine (6 - 2024-25 season) 05/15/2023   HEMOGLOBIN A1C  10/06/2023    Colorectal cancer screening: Type of screening: Colonoscopy. Completed 03/01/19. Repeat every 10 years  Additional Screening:  Hepatitis C Screening: does qualify;   Vision Screening: Recommended annual ophthalmology exams for early detection of glaucoma and other disorders of the eye. Is the patient up to date with their annual eye exam?  No  Who is the provider or what is the name of the office in which the patient attends annual eye exams? Dr Tasia Catchings wood [facility]  If pt is not established with a provider, would they like to be referred to a provider to establish care? No .   Dental Screening: Recommended annual dental exams for proper oral hygiene  Diabetic Foot Exam: 04/05/23  Community Resource Referral / Chronic Care Management: CRR required this visit?  No   CCM required this visit?  No     Plan:     I have personally reviewed  and noted the following in the patient's chart:   Medical and social history Use of alcohol, tobacco or illicit drugs  Current medications and supplements including opioid prescriptions. Patient is not currently taking opioid prescriptions. Functional ability and status Nutritional status Physical activity Advanced directives List of other physicians Hospitalizations, surgeries, and ER visits in previous 12 months Vitals Screenings to include cognitive, depression, and falls Referrals and appointments  In addition, I have reviewed and discussed with patient certain preventive protocols, quality metrics, and best practice recommendations. A written personalized  care plan for preventive services as well as general preventive health recommendations were provided to patient.     Marzella Schlein, LPN   05/11/5620   After Visit Summary: (MyChart) Due to this being a telephonic visit, the after visit summary with patients personalized plan was offered to patient via MyChart   Nurse Notes: pt declined cognition at this time knowledgable to questions asked

## 2023-10-06 NOTE — Patient Instructions (Signed)
Mark Frey , Thank you for taking time to come for your Medicare Wellness Visit. I appreciate your ongoing commitment to your health goals. Please review the following plan we discussed and let me know if I can assist you in the future.   Referrals/Orders/Follow-Ups/Clinician Recommendations: Aim for 30 minutes of exercise or brisk walking, 6-8 glasses of water, and 5 servings of fruits and vegetables each day.   Vaccinations: declines all Influenza vaccine: recommend every Fall Pneumococcal vaccine: recommend once per lifetime Prevnar-20 Tdap vaccine: recommend every 10 years Shingles vaccine: recommend Shingrix which is 2 doses 2-6 months apart and over 90% effective     Covid-19: recommend 2 doses one month apart with a booster 6 months later   This is a list of the screening recommended for you and due dates:  Health Maintenance  Topic Date Due   Eye exam for diabetics  03/23/2016   Flu Shot  Never done   COVID-19 Vaccine (6 - 2024-25 season) 05/15/2023   Hemoglobin A1C  10/06/2023   Pneumonia Vaccine (1 of 2 - PCV) 04/04/2024*   Hepatitis C Screening  10/11/2109*   Yearly kidney function blood test for diabetes  04/04/2024   Yearly kidney health urinalysis for diabetes  04/04/2024   Complete foot exam   04/04/2024   Medicare Annual Wellness Visit  10/05/2024   Colon Cancer Screening  02/28/2029   Zoster (Shingles) Vaccine  Completed   HPV Vaccine  Aged Out   DTaP/Tdap/Td vaccine  Discontinued  *Topic was postponed. The date shown is not the original due date.    Advanced directives: (Copy Requested) Please bring a copy of your health care power of attorney and living will to the office to be added to your chart at your convenience.  Next Medicare Annual Wellness Visit scheduled for next year: Yes

## 2023-11-30 ENCOUNTER — Telehealth: Payer: Self-pay | Admitting: Family Medicine

## 2023-11-30 NOTE — Telephone Encounter (Signed)
 I spoke with patient by phone call with his wife.  We discussed prior microalbumin to creatinine ratio miscalculation by [facility] lab and the fact that he developed microalbuminuria approximately 9 years ago but this was not evident to [place] due to his miscalculation.  His ratio peaked about 6 years ago at 292 but he has had tremendous improvement in A1c since that time and most recently down to 145.  We discussed this was phenomenal today without medication.  I gave him the option of starting medication like angiotensin receptor blocker-his blood pressure tends to run low at times and he prefers not to add medicine at this time but would like to recheck blood pressure and ratio at next visit.

## 2024-01-16 ENCOUNTER — Other Ambulatory Visit: Payer: Self-pay

## 2024-01-16 DIAGNOSIS — C8378 Burkitt lymphoma, lymph nodes of multiple sites: Secondary | ICD-10-CM

## 2024-01-17 ENCOUNTER — Inpatient Hospital Stay: Payer: PRIVATE HEALTH INSURANCE | Attending: Hematology

## 2024-01-17 ENCOUNTER — Inpatient Hospital Stay (HOSPITAL_BASED_OUTPATIENT_CLINIC_OR_DEPARTMENT_OTHER): Payer: PRIVATE HEALTH INSURANCE | Admitting: Hematology

## 2024-01-17 VITALS — BP 129/76 | HR 87 | Temp 98.1°F | Resp 17 | Wt 231.8 lb

## 2024-01-17 DIAGNOSIS — C8370 Burkitt lymphoma, unspecified site: Secondary | ICD-10-CM | POA: Insufficient documentation

## 2024-01-17 DIAGNOSIS — Z79899 Other long term (current) drug therapy: Secondary | ICD-10-CM | POA: Diagnosis not present

## 2024-01-17 DIAGNOSIS — Z86718 Personal history of other venous thrombosis and embolism: Secondary | ICD-10-CM | POA: Diagnosis not present

## 2024-01-17 DIAGNOSIS — C8378 Burkitt lymphoma, lymph nodes of multiple sites: Secondary | ICD-10-CM

## 2024-01-17 LAB — CMP (CANCER CENTER ONLY)
ALT: 42 U/L (ref 0–44)
AST: 31 U/L (ref 15–41)
Albumin: 4.1 g/dL (ref 3.5–5.0)
Alkaline Phosphatase: 63 U/L (ref 38–126)
Anion gap: 4 — ABNORMAL LOW (ref 5–15)
BUN: 16 mg/dL (ref 8–23)
CO2: 25 mmol/L (ref 22–32)
Calcium: 9.1 mg/dL (ref 8.9–10.3)
Chloride: 108 mmol/L (ref 98–111)
Creatinine: 1.07 mg/dL (ref 0.61–1.24)
GFR, Estimated: >60 mL/min (ref >60)
Glucose, Bld: 95 mg/dL (ref 70–99)
Potassium: 4.2 mmol/L (ref 3.5–5.1)
Sodium: 137 mmol/L (ref 135–145)
Total Bilirubin: 0.4 mg/dL (ref 0.0–1.2)
Total Protein: 7.3 g/dL (ref 6.5–8.1)

## 2024-01-17 LAB — CBC WITH DIFFERENTIAL (CANCER CENTER ONLY)
Abs Immature Granulocytes: 0.01 10*3/uL (ref 0.00–0.07)
Basophils Absolute: 0 10*3/uL (ref 0.0–0.1)
Basophils Relative: 1 %
Eosinophils Absolute: 0.1 10*3/uL (ref 0.0–0.5)
Eosinophils Relative: 1 %
HCT: 40.4 % (ref 39.0–52.0)
Hemoglobin: 14.1 g/dL (ref 13.0–17.0)
Immature Granulocytes: 0 %
Lymphocytes Relative: 31 %
Lymphs Abs: 1.8 10*3/uL (ref 0.7–4.0)
MCH: 34.5 pg — ABNORMAL HIGH (ref 26.0–34.0)
MCHC: 34.9 g/dL (ref 30.0–36.0)
MCV: 98.8 fL (ref 80.0–100.0)
Monocytes Absolute: 0.6 10*3/uL (ref 0.1–1.0)
Monocytes Relative: 10 %
Neutro Abs: 3.3 10*3/uL (ref 1.7–7.7)
Neutrophils Relative %: 57 %
Platelet Count: 184 10*3/uL (ref 150–400)
RBC: 4.09 MIL/uL — ABNORMAL LOW (ref 4.22–5.81)
RDW: 14.2 % (ref 11.5–15.5)
WBC Count: 5.9 10*3/uL (ref 4.0–10.5)
nRBC: 0 % (ref 0.0–0.2)

## 2024-01-17 LAB — LACTATE DEHYDROGENASE: LDH: 117 U/L (ref 98–192)

## 2024-01-18 ENCOUNTER — Telehealth: Payer: Self-pay | Admitting: Hematology

## 2024-01-18 NOTE — Telephone Encounter (Signed)
 Spoke with patient confirming upcoming appointment

## 2024-01-20 NOTE — Progress Notes (Signed)
 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  INTERVAL HISTORY:   Mark Frey is a 68 y.o. male here in clinic today for continued evaluation and management of his Burkitt's lymphoma.   Patient was last seen by me on 01/17/2023 and reported mild occasional bilateral leg swelling, and mild lump near his right lower abdominal around 2-3 months prior, which had resolved.   Patient is here for his 1 year follow-up and notes no acute new issues over the last year.  No fevers chills night sweats or abdominal weight loss.  No abdominal pain or distention.  No other new lumps or bumps.  Good p.o. intake. No issues with his ostomy  MEDICAL HISTORY:  Past Medical History:  Diagnosis Date   ALLERGIC RHINITIS    Cancer (HCC)    Lymphoma    Diabetes mellitus    History of deep venous thrombosis (DVT) of distal vein of right lower extremity    Also bilateral PE.  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 so was considered provoked   Hyperlipidemia     SURGICAL HISTORY: Past Surgical History:  Procedure Laterality Date   BIOPSY  03/15/2018   Procedure: BIOPSY;  Surgeon: Janel Medford, MD;  Location: [facility];  Service: Endoscopy;;   BIOPSY  03/01/2019   Procedure: BIOPSY;  Surgeon: Asencion Blacksmith, MD;  Location: [facility];  Service: Endoscopy;;   BOWEL RESECTION N/A 08/25/2018   Procedure: LAPROSCOPIC LOOP ILEOSTOMY;  Surgeon: Aldean Hummingbird, MD;  Location: [facility];  Service: General;  Laterality: N/A;   CLEFT PALATE REPAIR     COLECTOMY  07/30/2019   COLONOSCOPY N/A 03/15/2018   Procedure: COLONOSCOPY;  Surgeon: Janel Medford, MD;  Location: [facility];  Service: Endoscopy;  Laterality: N/A;   COLONOSCOPY N/A 08/20/2018   Procedure: COLONOSCOPY;  Surgeon: Tobin Forts, MD;  Location: [facility];  Service: Endoscopy;  Laterality: N/A;   deviated septum repair     slight improvement   ESOPHAGOGASTRODUODENOSCOPY N/A 03/15/2018   Procedure: ESOPHAGOGASTRODUODENOSCOPY (EGD);  Surgeon: Janel Medford, MD;  Location: [facility];  Service: Endoscopy;  Laterality: N/A;   FLEXIBLE SIGMOIDOSCOPY N/A 03/01/2019   Procedure: FLEXIBLE SIGMOIDOSCOPY;  Surgeon: Asencion Blacksmith, MD;  Location: [facility];  Service: Endoscopy;  Laterality: N/A;   ILEOSCOPY N/A 03/01/2019   Procedure: ILEOSCOPY THROUGH STOMA;  Surgeon: Asencion Blacksmith, MD;  Location: [facility];  Service: Endoscopy;  Laterality: N/A;   IR IMAGING GUIDED PORT INSERTION  03/17/2018   IR REMOVAL TUN ACCESS W/ PORT W/O FL MOD SED  09/22/2021   LAPAROSCOPY N/A 08/25/2018   Procedure: LAPAROSCOPY DIAGNOSTIC;  Surgeon: Aldean Hummingbird, MD;  Location: [facility];  Service: General;  Laterality: N/A;   TONSILLECTOMY      SOCIAL HISTORY: Social History   Socioeconomic History   Marital status: Married    Spouse name: lisa   Number of children: 0   Years of education: Not on file   Highest education level: Not on file  Occupational History   Not on file  Tobacco Use   Smoking status: Never   Smokeless tobacco: Never  Vaping Use   Vaping status: Never Used  Substance and Sexual Activity   Alcohol use: Not Currently    Comment: occasional   Drug use: No   Sexual activity: Yes  Other Topics Concern   Not on file  Social History Narrative   Married 1985. No kids. 4 small dogs.      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, residential      Hobbies: work on cars, Presenter, broadcasting, exercise as able   Social Drivers of Health   Financial Resource Strain: Low Risk  (10/06/2023)   Overall Financial Resource Strain (CARDIA)    Difficulty of Paying Living Expenses: Not hard at all   Food Insecurity: No Food Insecurity (10/06/2023)   Hunger Vital Sign    Worried About Running Out of Food in the Last Year: Never true    Ran Out of Food in the Last Year: Never true  Transportation Needs: No Transportation Needs (10/06/2023)   PRAPARE - Administrator, Civil Service (Medical): No    Lack of Transportation (Non-Medical): No  Physical Activity: Inactive (10/06/2023)   Exercise Vital Sign    Days of Exercise per Week: 0 days    Minutes of Exercise per Session: 0 min  Stress: No Stress Concern Present (10/06/2023)   [employer] of Occupational Health - Occupational Stress Questionnaire    Feeling of Stress : Not at all  Social Connections: Moderately Isolated (10/06/2023)   Social Connection and Isolation Panel [NHANES]    Frequency of Communication with Friends and Family: More than three times a week    Frequency of Social Gatherings with Friends and Family: More than three times a week    Attends Religious Services: Never    Database administrator or Organizations: No    Attends Banker Meetings: Never    Marital Status: Married  Catering manager Violence: Not At Risk (10/06/2023)   Humiliation, Afraid, Rape, and Kick questionnaire    Fear of Current or Ex-Partner: No    Emotionally Abused: No    Physically Abused: No    Sexually Abused: No    FAMILY HISTORY: Family History  Problem Relation Age of Onset   Lung cancer Mother        smoker   Brain cancer Mother        metastasis   AAA (abdominal aortic aneurysm) Father        smoker    ALLERGIES:  is allergic to ciprofloxacin.  MEDICATIONS:  Current Outpatient Medications  Medication Sig Dispense Refill   acetaminophen  (TYLENOL ) 325 MG tablet Take 2 tablets (650 mg total) by mouth every 6 (six) hours as needed for mild pain (or temp > 100).     B Complex Vitamins (VITAMIN B COMPLEX PO) Take 1 tablet by mouth daily.      Bismuth Subgallate (DEVROM) 200 MG CAPS Take 200 mg by  mouth daily.     citalopram  (CELEXA ) 10 MG tablet Take 1 tablet (10 mg total) by mouth daily. 90 tablet 3   loperamide  (IMODIUM ) 2 MG capsule Take 2 mg by mouth as needed for diarrhea or loose stools.     Magnesium  Oxide -Mg Supplement 400 MG CAPS Take 400 mg by mouth daily at 6 (six) AM.     No current facility-administered medications for this visit.    REVIEW OF SYSTEMS:    .10 Point review of Systems was done is negative except as noted above.  PHYSICAL EXAMINATION: ECOG FS:2 - Symptomatic, <50% confined to bed  Vitals:   01/17/24 1345  BP: 129/76  Pulse: 87  Resp: 17  Temp: 98.1 F (36.7 C)  SpO2: 97%    Wt Readings from Last 3 Encounters:  01/17/24 231 lb 12.8 oz (105.1 kg)  10/06/23 237 lb (107.5 kg)  04/05/23 237 lb 6.4 oz (107.7 kg)   Body mass index is 34.23 kg/m.     GENERAL:alert, in no acute distress and comfortable SKIN: no acute rashes, no significant lesions EYES: conjunctiva are pink and non-injected, sclera anicteric OROPHARYNX: MMM, no exudates, no oropharyngeal erythema or ulceration NECK: supple, no JVD LYMPH:  no palpable lymphadenopathy in the cervical, axillary or inguinal regions LUNGS: clear to auscultation b/l with normal respiratory effort HEART: regular rate & rhythm ABDOMEN:  normoactive bowel sounds , non tender, not distended. Extremity: no pedal edema PSYCH: alert & oriented x 3 with fluent speech NEURO: no focal motor/sensory deficits    LABORATORY DATA:      Latest Ref Rng & Units 01/17/2024    1:15 PM 04/05/2023    3:54 PM 01/17/2023   12:52 PM  CBC  WBC 4.0 - 10.5 K/uL 5.9  6.2  6.9   Hemoglobin 13.0 - 17.0 g/dL 16.1  09.6  04.5   Hematocrit 39.0 - 52.0 % 40.4  42.0  43.3   Platelets 150 - 400 K/uL 184  236.0  202         Latest Ref Rng & Units 01/17/2024    1:15 PM 04/05/2023    3:54 PM 01/17/2023   12:52 PM  CMP  Glucose 70 - 99 mg/dL 95  409  811   BUN 8 - 23 mg/dL 16  18  14    Creatinine 0.61 - 1.24 mg/dL 9.14  7.82   9.56   Sodium 135 - 145 mmol/L 137  137  137   Potassium 3.5 - 5.1 mmol/L 4.2  3.9  4.0   Chloride 98 - 111 mmol/L 108  103  108   CO2 22 - 32 mmol/L 25  24  22    Calcium 8.9 - 10.3 mg/dL 9.1  9.9  9.2   Total Protein 6.5 - 8.1 g/dL 7.3  7.2  7.5   Total Bilirubin 0.0 - 1.2 mg/dL 0.4  0.3  0.3   Alkaline Phos 38 - 126 U/L 63  60  70   AST 15 - 41 U/L 31  43  42   ALT 0 - 44 U/L 42  64  70    Lab Results  Component Value Date   LDH 117 01/17/2024    03/01/19 Colonoscopy biopsies:     03/17/18 Cytogenetics:   03/17/18 Colon Bx:   03/13/18 Peritoneum Biopsy:     07/02/2019 PORTABLE CHEST 1 VIEW (Accession [id number])    07/02/2019 CT ABDOMEN AND PELVIS WITH CONTRAST (Accession [id number])  RADIOGRAPHIC STUDIES:  I have personally reviewed the radiological images as listed and agreed with the findings in the report. No results found.  ASSESSMENT & PLAN:   68 y.o. male with   1.  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 03/10/18 CT A/P revealed Large irregular infiltrative solid mass in the right lower quadrant measuring up to 18.7 x 18.5 x 17.8 cm, infiltrating and encasing multiple distal small bowel loops and likely the ileocecal region, partially encasing the sigmoid colon, with prominent extension into the right lower retroperitoneum and extraperitoneal right pelvis with encasement of right external iliac and proximal right common iliac  vasculature and infiltration of the right iliopsoas muscle. No BM or CNS involvement.    04/18/18 PET/CT revealed Continued improvement in right colonic wall thickening and surrounding bulky masses consistent with treated lymphoma. No hypermetabolic activity to suggest residual tumor. There is some hypermetabolic activity associated with the sigmoid colon which demonstrates mild wall thickening, surrounding inflammation and underlying diverticulosis,  suggesting mild diverticulitis. Suspected treatment related changes throughout the bone marrow. There is mildly increased activity within the clivus without clear corresponding finding on the CT images. Attention on follow-up recommended. Nonobstructing right renal calculi. 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    06/09/18 PET/CT revealed Soft tissue lesion within the ventral pelvis is again identified demonstrating mild to moderate increased uptake within SUV max of 5.61. Deauville criteria 4. Concerning for residual metabolically active tumor. 2. Similar appearance of diffusely thickened appendix within the right lower quadrant of the abdomen. Findings may reflect treatment related changes. 3. Increased radiotracer uptake throughout the bone marrow which is favored to represent treatment related changes. 4. Lad coronary artery atherosclerotic calcifications.   10/05/18 CT A/P revealed Persistent wall thickening in the sigmoid colon although a component of this could be due to diverticulosis. There is also a suggestion of wall thickening and potential mucosal enhancement in the terminal ileum, as well as adjacent indistinct stranding in the mesenteric and omental adipose tissues roughly similar to the prior exam. Some of this may be treatment related. Inflammatory process such as Crohn's disease not excluded. 2. No current adenopathy. 3. Other imaging findings of potential clinical significance: Nonobstructive right nephrolithiasis. Aortic Atherosclerosis. Moderate prostatomegaly. Left foraminal impingement at L4-5.  S/p IMRT with 36 Gy over 20 fractions, between 11/09/18 and 12/06/18 with Dr. Lurena Sally  05/24/2019 CT CAP revealed "1. Overall little interval change from CT 05/09/2019. Persistent inflamed loop of distal sigmoid colon with submucosal edema. Potential fistulous communication versus tethering to the ascending colon and a loop of small bowel. Findings are suggestive of inflammatory bowel disease. 2. No  evidence of bowel obstruction. Oral contrast enters the ileostomy bag with bowel obstruction or small bowel dilatation."   2. H/o RLE DVT and b/l PE now off anticoagulation   3.  History of recurrent small bowel obstruction/ileus with sigmoid thickening/stenosis .  CT scan - sigmoid obstructive lesion ? Scar tissue from resolved lymphoma vs residual lymphoma vs post inflammatory scarring from diverticulitis/limited perforation. Also has SBO from adhesions. Colonoscopy 08/21/2018 - no intramucosal lesions. Extrinsic compression of sigmoid colon. S/p diverting ileostomy on 08/25/18 with Dr. Aldean Hummingbird  4.  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  GI panel and C. difficile negative.  Now s/p colectomy  --surgical pathology with no residual lymphoma.   PLAN:  -Discussed lab results on 01/17/2024 in detail with patient. CBC showed WBC of 5.9K, hemoglobin of 14.1, and platelets of 184K. -CMP stable LDH within normal limits Patient has no lab or clinical findings suggestive of lymphoma recurrence at this time. No indication for further workup or treatment of his history of Burkitt's lymphoma at this time. Recommended staying up to speed with all his vaccines. Continue follow-up with PCP for age-appropriate cancer screening. Recommended healthy diet and exercise to maintain close to good body weight.  FOLLOW-UP: RTC with Dr Salomon Cree with labs in 12 months  The total time spent in the appointment was 20 minutes* .  All of the patient's questions were answered with apparent satisfaction. The patient knows to call the clinic with any problems, questions or  concerns.   Jacquelyn Matt MD MS AAHIVMS [facility] Hematology/Oncology Physician [facility]  .*Total Encounter Time as defined by the Centers for Medicare and Medicaid Services includes, in addition to the face-to-face time of a patient visit (documented in the note above) non-face-to-face time: obtaining and reviewing  outside history, ordering and reviewing medications, tests or procedures, care coordination (communications with other health care professionals or caregivers) and documentation in the medical record.    I,Mitra Faeizi,acting as a Neurosurgeon for Jacquelyn Matt, MD.,have documented all relevant documentation on the behalf of Jacquelyn Matt, MD,as directed by  Jacquelyn Matt, MD while in the presence of Jacquelyn Matt, MD.  .I have reviewed the above documentation for accuracy and completeness, and I agree with the above. .Eward Rutigliano Kishore Bonetta Mostek MD

## 2024-01-23 ENCOUNTER — Encounter: Payer: Self-pay | Admitting: Hematology

## 2024-04-06 ENCOUNTER — Encounter: Payer: Self-pay | Admitting: Hematology

## 2024-04-06 ENCOUNTER — Encounter: Payer: Self-pay | Admitting: Family Medicine

## 2024-04-06 ENCOUNTER — Ambulatory Visit: Payer: Self-pay | Admitting: Family Medicine

## 2024-04-06 ENCOUNTER — Ambulatory Visit (INDEPENDENT_AMBULATORY_CARE_PROVIDER_SITE_OTHER): Admitting: Family Medicine

## 2024-04-06 VITALS — BP 110/72 | HR 79 | Temp 98.3°F | Ht 69.0 in | Wt 233.4 lb

## 2024-04-06 DIAGNOSIS — E785 Hyperlipidemia, unspecified: Secondary | ICD-10-CM

## 2024-04-06 DIAGNOSIS — E119 Type 2 diabetes mellitus without complications: Secondary | ICD-10-CM

## 2024-04-06 DIAGNOSIS — E1169 Type 2 diabetes mellitus with other specified complication: Secondary | ICD-10-CM | POA: Diagnosis not present

## 2024-04-06 DIAGNOSIS — Z933 Colostomy status: Secondary | ICD-10-CM

## 2024-04-06 LAB — LIPID PANEL
Cholesterol: 122 mg/dL (ref 0–200)
HDL: 33 mg/dL — ABNORMAL LOW (ref >39.00)
LDL Cholesterol: 52 mg/dL (ref 0–99)
NonHDL: 89.4
Total CHOL/HDL Ratio: 4
Triglycerides: 186 mg/dL — ABNORMAL HIGH (ref 0.0–149.0)
VLDL: 37.2 mg/dL (ref 0.0–40.0)

## 2024-04-06 LAB — MAGNESIUM: Magnesium: 1.7 mg/dL (ref 1.5–2.5)

## 2024-04-06 LAB — MICROALBUMIN / CREATININE URINE RATIO
Creatinine,U: 191.9 mg/dL
Microalb Creat Ratio: 202.5 mg/g — ABNORMAL HIGH (ref 0.0–30.0)
Microalb, Ur: 38.9 mg/dL — ABNORMAL HIGH (ref 0.0–1.9)

## 2024-04-06 MED ORDER — CITALOPRAM HYDROBROMIDE 10 MG PO TABS: 10.0000 mg | ORAL_TABLET | Freq: Every day | ORAL | 3 refills | Status: AC

## 2024-04-06 NOTE — Progress Notes (Signed)
 Phone [phone] In person visit   Subjective:   Mark Frey is a 68 y.o. 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) 03/15/2018    Priority: High   History of pulmonary embolism 03/10/2018    Priority: High   Controlled diabetes mellitus type II without complication (HCC) 06/05/2014    Priority: High   Depression, major, single episode, complete remission (HCC) 04/17/2019    Priority: Medium    Hypomagnesemia 01/11/2019    Priority: Medium    Edema     Priority: Medium    Tachycardia 01/31/2017    Priority: Medium    Hypertension associated with diabetes (HCC) 06/05/2014    Priority: Medium    Hyperlipidemia associated with type 2 diabetes mellitus (HCC) 05/12/2007    Priority: Medium    Radiation gastroenteritis     Priority: Low   Radiation colitis     Priority: Low   Loop ilesotomy for fecal diversion of sigmoid stricture 08/25/2018    Priority: Low   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HCC) 08/19/2018    Priority: Low   Counseling regarding advance care planning and goals of care 04/25/2018    Priority: Low   Dysmetabolic syndrome X 01/27/2009    Priority: Low   BACK PAIN WITH RADICULOPATHY 06/09/2007    Priority: Low   Allergic rhinitis 05/12/2007    Priority: Low   Essential hypertension 10/03/2021   Elevated ALT measurement 10/03/2021   Port-A-Cath in place 03/27/2018    Medications- reviewed and updated Current Outpatient Medications  Medication Sig Dispense Refill   acetaminophen  (TYLENOL ) 325 MG tablet Take 2 tablets (650 mg total) by mouth  every 6 (six) hours as needed for mild pain (or temp > 100).     B Complex Vitamins (VITAMIN B COMPLEX PO) Take 1 tablet by mouth daily.      Bismuth Subgallate (DEVROM) 200 MG CAPS Take 200 mg by mouth daily.     loperamide  (IMODIUM ) 2 MG capsule Take 2 mg by mouth as needed for diarrhea or loose stools.     Magnesium  Oxide -Mg Supplement 400 MG CAPS Take 400 mg by mouth daily at 6 (six) AM.     citalopram  (CELEXA ) 10 MG tablet Take 1 tablet (10 mg total) by mouth daily. 90 tablet 3   No current facility-administered medications for this visit.     Objective:  BP 110/72 (BP Location: Right Arm, Patient Position: Sitting, Cuff Size: Normal)   Pulse 79   Temp 98.3 F (36.8 C) (Temporal)   Ht 5' 9 (1.753 m)   Wt 233 lb 6.4 oz (105.9 kg)   SpO2 95%   BMI 34.47 kg/m  Gen: NAD, resting comfortably Some cerumen in right- declines intervention- no hearing loss CV: RRR no murmurs rubs or gallops Lungs: CTAB no crackles, wheeze, rhonchi Abdomen: soft/nontender/nondistended/normal bowel sounds. No rebound or guarding.  Ext: no edema Skin: warm, dry Neuro: grossly normal, moves all extremities     Assessment and Plan   #social update- wants to take more palliative  approach after all he has been through. Wants to minimize interventions.   # Health maintenance - Vaccinations-patient declines Prevnar 20, COVID-19  Immunization History  Administered Date(s) Administered   PFIZER(Purple Top)SARS-COV-2 Vaccination 12/12/2019, 01/02/2020, 07/28/2020, 02/19/2021, 07/20/2021   Td 09/17/1998   Zoster Recombinant(Shingrix ) 12/28/2017, 02/01/2020  - with total colectomy not candidate for colonoscopy -never smoker so doesn't need lung cancer screening -immunizations- holding off on further COVID shots -Td declines for now- knows needs if gets cut/scrape -offered PSA blood test- declines-aware of cancer risk and national recommendations Lab Results  Component Value Date   PSA 0.53 06/23/2020    PSA 0.87 08/29/2017   PSA 0.72 10/21/2015  -dermatology declines  #B12 concern with prior macrocytosis- has resolved though- hold off on b12  % History of high-grade B-cell lymphoma/Burkitt lymphoma  -Discovered on CT March 10, 2018-on initial measures 18.7 x 18.5 x 17.8 cm -S/p IMRT with 36 Gy over 20 fractions, between 11/09/18 and 12/06/18 with Dr. Izell - last visit with Dr. Onesimo 01/17/24 and he was allowed to return in 1 year- good report and no repeat imaging needed - may get released next year  #hypertension-has been off medication since weight loss related to Burkitt lymphoma and subsequent complications S: medication: None  BP Readings from Last 3 Encounters:  04/06/24 110/72  01/17/24 129/76  04/05/23 128/72  A/P: remains well controlled despite no medicine- continue to monitor    # Diabetes-has been off medication since weight loss related to Burkitt lymphoma and subsequent complications S: Medication: None  CBGs-does not check  Exercise and diet-peak weight 298 prior to Burkitt's lymphoma. Down 4 lbs from last year- LFTs normalized!   Lab Results  Component Value Date   HGBA1C 6.9 (H) 04/05/2023   HGBA1C 5.9 (A) 04/01/2022   HGBA1C 5.8 (H) 06/23/2020   A/P: hopefully stable- update a1c today. Continue without meds for now   -prior microalbuminuria- prefers to hold off on valsartan and jardiance- can discuss further depending on results today   # Depression S: Medication: celexa  10 mg    04/06/2024   10:38 AM 10/06/2023    2:12 PM 04/05/2023    2:44 PM  Depression screen PHQ 2/9  Decreased Interest 0 0 0  Down, Depressed, Hopeless 0 0 0  PHQ - 2 Score 0 0 0  Altered sleeping 0  0  Tired, decreased energy 0  0  Change in appetite 0  0  Feeling bad or failure about yourself  0  0  Trouble concentrating 0  0  Moving slowly or fidgety/restless 0  0  Suicidal thoughts 0  0  PHQ-9 Score 0  0  Difficult doing work/chores Not difficult at all  Not difficult at all   A/P: full remission- continue current medications    #Edema -better lately - coming off soda seemed to help  #hyperlipidemia- refuses statin despite diabetes S: Medication: None  Lab Results  Component Value Date   CHOL 158 04/05/2023   HDL 28.80 (L) 04/05/2023   LDLCALC 45 04/01/2022   LDLDIRECT 94.0 04/05/2023   TRIG (H) 04/05/2023    427.0 Triglyceride is over 400; calculations on Lipids are invalid.   CHOLHDL 5 04/05/2023   A/P: hyperlipidemia noted- with diabetes discussed statin even once weekly or CT calcium  scoring - he is aware of risks of plaque rupture and recommendations on statins- declines still- we will respect his decision  #colostomy in place- functioning well- gets supplies through surgeon  Recommended follow up:  Return in about 6 months (around 10/07/2024) for followup or sooner if needed.Schedule b4 you leave. Future Appointments  Date Time Provider Department Center  01/18/2025  1:30 PM [facility] None  01/18/2025  2:00 PM Onesimo Emaline Brink, MD [facility] None    Lab/Order associations: egg sandwich with cheese- has to eat   ICD-10-CM   1. Hyperlipidemia associated with type 2 diabetes mellitus (HCC)  E11.69 Lipid panel   E78.5     2. Controlled type 2 diabetes mellitus without complication, without long-term current use of insulin  (HCC)  E11.9 Urine Albumin /Creatinine with ratio (send out) [LAB689]    Hemoglobin A1c    Lipid panel    3. Colostomy status (HCC) Chronic Z93.3     4. Hypomagnesemia  E83.42 Magnesium       Meds ordered this encounter  Medications   citalopram  (CELEXA ) 10 MG tablet    Sig: Take 1 tablet (10 mg total) by mouth daily.    Dispense:  90 tablet    Refill:  3    Return precautions advised.  Garnette Lukes, MD

## 2024-04-06 NOTE — Patient Instructions (Addendum)
 Please stop by lab before you go- blood and urine If you have mychart- we will send your results within 3 business days of us  receiving them.  If you do not have mychart- we will call you about results within 5 business days of us  receiving them.  *please also note that you will see labs on mychart as soon as they post. I will later go in and write notes on them- will say notes from Dr. Katrinka    Recommended follow up: Return in about 6 months (around 10/07/2024) for followup or sooner if needed.Schedule b4 you leave. At absolute latest 1 year for physical backup twoudl love to see you in 6 months

## 2024-04-06 NOTE — Addendum Note (Signed)
 Addended by: Tuff Clabo on: 04/06/2024 03:51 PM   Modules accepted: Orders

## 2024-04-06 NOTE — Addendum Note (Signed)
 Addended by: Flint Hakeem on: 04/06/2024 03:53 PM   Modules accepted: Orders

## 2024-04-23 ENCOUNTER — Encounter: Payer: Self-pay | Admitting: Hematology

## 2024-04-30 ENCOUNTER — Other Ambulatory Visit (INDEPENDENT_AMBULATORY_CARE_PROVIDER_SITE_OTHER)

## 2024-04-30 DIAGNOSIS — E119 Type 2 diabetes mellitus without complications: Secondary | ICD-10-CM | POA: Diagnosis not present

## 2024-04-30 LAB — HEMOGLOBIN A1C: Hgb A1c MFr Bld: 6.9 % — ABNORMAL HIGH (ref 4.6–6.5)

## 2024-09-17 ENCOUNTER — Encounter: Payer: Self-pay | Admitting: Hematology

## 2024-09-17 ENCOUNTER — Encounter: Payer: Self-pay | Admitting: Family Medicine

## 2024-09-17 DIAGNOSIS — B351 Tinea unguium: Secondary | ICD-10-CM

## 2024-09-26 ENCOUNTER — Ambulatory Visit: Admitting: Podiatry

## 2024-09-26 ENCOUNTER — Encounter: Payer: Self-pay | Admitting: Podiatry

## 2024-09-26 VITALS — Ht 69.0 in | Wt 233.0 lb

## 2024-09-26 DIAGNOSIS — L600 Ingrowing nail: Secondary | ICD-10-CM | POA: Diagnosis not present

## 2024-09-26 DIAGNOSIS — B351 Tinea unguium: Secondary | ICD-10-CM

## 2024-09-26 NOTE — Progress Notes (Signed)
 Subjective:   Patient ID: Mark Frey, male   DOB: 69 y.o.   MRN: [id number]   HPI Patient presents with severely thickened left big toenail that is dystrophic and painful and right hallux nail is discolored but not currently painful.  States has been this way for a number of years and he presents with caregiver today.  Patient does not smoke likes to be active   Review of Systems  All other systems reviewed and are negative.       Objective:  Physical Exam Vitals and nursing note reviewed.  Constitutional:      Appearance: He is well-developed.  Pulmonary:     Effort: Pulmonary effort is normal.  Musculoskeletal:        General: Normal range of motion.  Skin:    General: Skin is warm.  Neurological:     Mental Status: He is alert.     Neurovascular status found to be intact muscle strength adequate range of motion within normal limits with severely thickened dystrophic painful left hallux nailbed with inability to wear shoe gear comfortably with right hallux nail that is yellow but not discomforting.  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  I do think given the severe thickness and pattern of the left big toenail would be best removed and patient wants to get this done.  I explained the risk of doing this and procedure and patient wants surgery.  At this point I allowed him to read and signed consent form understanding risk and patient signed consent form after review.  Patient had the left hallux infiltrated 60 mg like Marcaine  mixture sterile prep done using sterile instrumentation removed the hallux nail exposed matrix applied phenol 5 applications 30 seconds followed by alcohol lavage sterile dressing gave instructions on soaks wear  dressing 24 hours take it off earlier if throbbing were to occur

## 2024-09-26 NOTE — Patient Instructions (Signed)

## 2025-01-18 ENCOUNTER — Ambulatory Visit: Admitting: Hematology

## 2025-01-18 ENCOUNTER — Other Ambulatory Visit
